# Patient Record
Sex: Male | Born: 1950 | Race: White | Hispanic: No | Smoking: Former smoker
Health system: Southern US, Community
[De-identification: ages and names within clinical notes are randomized; demographics above are authoritative.]

## PROBLEM LIST (undated history)

## (undated) DIAGNOSIS — S069XAA Unspecified intracranial injury with loss of consciousness status unknown, initial encounter: Secondary | ICD-10-CM

## (undated) DIAGNOSIS — I10 Essential (primary) hypertension: Secondary | ICD-10-CM

## (undated) DIAGNOSIS — S069X9A Unspecified intracranial injury with loss of consciousness of unspecified duration, initial encounter: Secondary | ICD-10-CM

## (undated) DIAGNOSIS — F1010 Alcohol abuse, uncomplicated: Secondary | ICD-10-CM

## (undated) DIAGNOSIS — L89309 Pressure ulcer of unspecified buttock, unspecified stage: Secondary | ICD-10-CM

## (undated) DIAGNOSIS — E871 Hypo-osmolality and hyponatremia: Secondary | ICD-10-CM

## (undated) DIAGNOSIS — R29898 Other symptoms and signs involving the musculoskeletal system: Secondary | ICD-10-CM

## (undated) DIAGNOSIS — I4891 Unspecified atrial fibrillation: Secondary | ICD-10-CM

## (undated) HISTORY — PX: NECK SURGERY: SHX720

## (undated) HISTORY — PX: APPENDECTOMY: SHX54

---

## 2005-02-04 ENCOUNTER — Ambulatory Visit: Payer: Self-pay | Admitting: Family Medicine

## 2005-04-11 ENCOUNTER — Ambulatory Visit: Payer: Self-pay

## 2013-10-01 ENCOUNTER — Emergency Department: Payer: Self-pay | Admitting: Internal Medicine

## 2014-05-22 DIAGNOSIS — G8111 Spastic hemiplegia affecting right dominant side: Secondary | ICD-10-CM | POA: Insufficient documentation

## 2014-05-22 DIAGNOSIS — I10 Essential (primary) hypertension: Secondary | ICD-10-CM | POA: Insufficient documentation

## 2014-05-22 DIAGNOSIS — Z87820 Personal history of traumatic brain injury: Secondary | ICD-10-CM | POA: Insufficient documentation

## 2015-07-09 ENCOUNTER — Inpatient Hospital Stay
Admission: EM | Admit: 2015-07-09 | Discharge: 2015-07-11 | DRG: 644 | Disposition: A | Discharge status: Home / Self Care | Payer: 59 | Attending: Internal Medicine | Admitting: Internal Medicine

## 2015-07-09 ENCOUNTER — Encounter: Payer: Self-pay | Admitting: Emergency Medicine

## 2015-07-09 DIAGNOSIS — I10 Essential (primary) hypertension: Secondary | ICD-10-CM | POA: Diagnosis present

## 2015-07-09 DIAGNOSIS — G8191 Hemiplegia, unspecified affecting right dominant side: Secondary | ICD-10-CM | POA: Diagnosis present

## 2015-07-09 DIAGNOSIS — E860 Dehydration: Secondary | ICD-10-CM | POA: Diagnosis present

## 2015-07-09 DIAGNOSIS — R112 Nausea with vomiting, unspecified: Secondary | ICD-10-CM

## 2015-07-09 DIAGNOSIS — S0990XS Unspecified injury of head, sequela: Secondary | ICD-10-CM | POA: Diagnosis not present

## 2015-07-09 DIAGNOSIS — F1010 Alcohol abuse, uncomplicated: Secondary | ICD-10-CM | POA: Diagnosis present

## 2015-07-09 DIAGNOSIS — Z79899 Other long term (current) drug therapy: Secondary | ICD-10-CM

## 2015-07-09 DIAGNOSIS — R197 Diarrhea, unspecified: Secondary | ICD-10-CM | POA: Diagnosis present

## 2015-07-09 DIAGNOSIS — Z7982 Long term (current) use of aspirin: Secondary | ICD-10-CM

## 2015-07-09 DIAGNOSIS — Z791 Long term (current) use of non-steroidal anti-inflammatories (NSAID): Secondary | ICD-10-CM

## 2015-07-09 DIAGNOSIS — E876 Hypokalemia: Secondary | ICD-10-CM | POA: Diagnosis present

## 2015-07-09 DIAGNOSIS — Z8249 Family history of ischemic heart disease and other diseases of the circulatory system: Secondary | ICD-10-CM | POA: Diagnosis not present

## 2015-07-09 DIAGNOSIS — E870 Hyperosmolality and hypernatremia: Secondary | ICD-10-CM | POA: Diagnosis present

## 2015-07-09 DIAGNOSIS — E871 Hypo-osmolality and hyponatremia: Secondary | ICD-10-CM | POA: Diagnosis present

## 2015-07-09 DIAGNOSIS — E222 Syndrome of inappropriate secretion of antidiuretic hormone: Principal | ICD-10-CM | POA: Diagnosis present

## 2015-07-09 DIAGNOSIS — F419 Anxiety disorder, unspecified: Secondary | ICD-10-CM | POA: Diagnosis present

## 2015-07-09 HISTORY — DX: Essential (primary) hypertension: I10

## 2015-07-09 LAB — URINALYSIS COMPLETE WITH MICROSCOPIC (ARMC ONLY)
BACTERIA UA: NONE SEEN
Bilirubin Urine: NEGATIVE
GLUCOSE, UA: NEGATIVE mg/dL
HGB URINE DIPSTICK: NEGATIVE
LEUKOCYTES UA: NEGATIVE
NITRITE: NEGATIVE
PROTEIN: NEGATIVE mg/dL
SPECIFIC GRAVITY, URINE: 1.014 (ref 1.005–1.030)
Squamous Epithelial / LPF: NONE SEEN
pH: 7 (ref 5.0–8.0)

## 2015-07-09 LAB — MRSA PCR SCREENING: MRSA by PCR: NEGATIVE

## 2015-07-09 LAB — COMPREHENSIVE METABOLIC PANEL
ALT: 42 U/L (ref 17–63)
ANION GAP: 11 (ref 5–15)
AST: 83 U/L — ABNORMAL HIGH (ref 15–41)
Albumin: 4.7 g/dL (ref 3.5–5.0)
Alkaline Phosphatase: 98 U/L (ref 38–126)
BUN: 7 mg/dL (ref 6–20)
CHLORIDE: 80 mmol/L — AB (ref 101–111)
CO2: 24 mmol/L (ref 22–32)
Calcium: 8.8 mg/dL — ABNORMAL LOW (ref 8.9–10.3)
Creatinine, Ser: 0.43 mg/dL — ABNORMAL LOW (ref 0.61–1.24)
GFR calc Af Amer: >60 mL/min (ref >60)
GFR calc non Af Amer: >60 mL/min (ref >60)
Glucose, Bld: 105 mg/dL — ABNORMAL HIGH (ref 65–99)
Potassium: 3.8 mmol/L (ref 3.5–5.1)
SODIUM: 115 mmol/L — AB (ref 135–145)
Total Bilirubin: 2.5 mg/dL — ABNORMAL HIGH (ref 0.3–1.2)
Total Protein: 7.6 g/dL (ref 6.5–8.1)

## 2015-07-09 LAB — CREATININE, URINE, RANDOM: CREATININE, URINE: 36 mg/dL

## 2015-07-09 LAB — TSH: TSH: 1.451 u[IU]/mL (ref 0.350–4.500)

## 2015-07-09 LAB — C DIFFICILE QUICK SCREEN W PCR REFLEX
C Diff antigen: NEGATIVE
C Diff interpretation: NEGATIVE
C Diff toxin: NEGATIVE

## 2015-07-09 LAB — CBC
HEMATOCRIT: 41.8 % (ref 40.0–52.0)
HEMOGLOBIN: 14.8 g/dL (ref 13.0–18.0)
MCH: 32.6 pg (ref 26.0–34.0)
MCHC: 35.3 g/dL (ref 32.0–36.0)
MCV: 92.3 fL (ref 80.0–100.0)
Platelets: 175 10*3/uL (ref 150–440)
RBC: 4.53 MIL/uL (ref 4.40–5.90)
RDW: 12 % (ref 11.5–14.5)
WBC: 8.6 10*3/uL (ref 3.8–10.6)

## 2015-07-09 LAB — SODIUM: Sodium: 119 mmol/L — CL (ref 135–145)

## 2015-07-09 LAB — LIPASE, BLOOD: LIPASE: 28 U/L (ref 22–51)

## 2015-07-09 LAB — SODIUM, URINE, RANDOM: SODIUM UR: 75 mmol/L

## 2015-07-09 LAB — OSMOLALITY, URINE: OSMOLALITY UR: 277 mosm/kg — AB (ref 300–900)

## 2015-07-09 MED ORDER — ENOXAPARIN SODIUM 40 MG/0.4ML ~~LOC~~ SOLN
40.0000 mg | SUBCUTANEOUS | Status: DC
  Administered 2015-07-09 – 2015-07-10 (×2): 40 mg via SUBCUTANEOUS
  Filled 2015-07-09 (×3): qty 0.4

## 2015-07-09 MED ORDER — HYDRALAZINE HCL 20 MG/ML IJ SOLN: 10.0000 mg | Freq: Four times a day (QID) | INTRAMUSCULAR | Status: DC | PRN

## 2015-07-09 MED ORDER — ACETAMINOPHEN 650 MG RE SUPP: 650.0000 mg | Freq: Four times a day (QID) | RECTAL | Status: DC | PRN

## 2015-07-09 MED ORDER — HYDROXYZINE HCL 25 MG PO TABS
25.0000 mg | ORAL_TABLET | Freq: Every day | ORAL | Status: DC
  Administered 2015-07-09 – 2015-07-10 (×2): 25 mg via ORAL
  Filled 2015-07-09 (×2): qty 1

## 2015-07-09 MED ORDER — SODIUM CHLORIDE 0.9 % IJ SOLN
3.0000 mL | Freq: Two times a day (BID) | INTRAMUSCULAR | Status: DC
Start: 2015-07-09 — End: 2015-07-11
  Administered 2015-07-09 – 2015-07-10 (×3): 3 mL via INTRAVENOUS

## 2015-07-09 MED ORDER — LORAZEPAM 0.5 MG PO TABS
1.0000 mg | ORAL_TABLET | Freq: Four times a day (QID) | ORAL | Status: DC | PRN
  Filled 2015-07-09: qty 2

## 2015-07-09 MED ORDER — VITAMIN B-1 100 MG PO TABS
100.0000 mg | ORAL_TABLET | Freq: Every day | ORAL | Status: DC
  Administered 2015-07-09 – 2015-07-11 (×3): 100 mg via ORAL
  Filled 2015-07-09 (×3): qty 1

## 2015-07-09 MED ORDER — SODIUM CHLORIDE 0.9 % IV BOLUS (SEPSIS)
1000.0000 mL | Freq: Once | INTRAVENOUS | Status: AC
  Administered 2015-07-09: 1000 mL via INTRAVENOUS

## 2015-07-09 MED ORDER — THIAMINE HCL 100 MG/ML IJ SOLN: 100.0000 mg | Freq: Every day | INTRAMUSCULAR | Status: DC

## 2015-07-09 MED ORDER — ALUM & MAG HYDROXIDE-SIMETH 200-200-20 MG/5ML PO SUSP: 30.0000 mL | Freq: Four times a day (QID) | ORAL | Status: DC | PRN

## 2015-07-09 MED ORDER — ONDANSETRON HCL 4 MG/2ML IJ SOLN: 4.0000 mg | Freq: Four times a day (QID) | INTRAMUSCULAR | Status: DC | PRN

## 2015-07-09 MED ORDER — BACLOFEN 10 MG PO TABS
10.0000 mg | ORAL_TABLET | Freq: Three times a day (TID) | ORAL | Status: DC
  Administered 2015-07-09 – 2015-07-11 (×6): 10 mg via ORAL
  Filled 2015-07-09 (×6): qty 1

## 2015-07-09 MED ORDER — ATENOLOL 50 MG PO TABS
25.0000 mg | ORAL_TABLET | Freq: Two times a day (BID) | ORAL | Status: DC
  Administered 2015-07-09: 50 mg via ORAL
  Administered 2015-07-10: 25 mg via ORAL
  Administered 2015-07-10: 21:00:00 via ORAL
  Administered 2015-07-11: 25 mg via ORAL
  Filled 2015-07-09 (×4): qty 1

## 2015-07-09 MED ORDER — AMLODIPINE BESYLATE 10 MG PO TABS
10.0000 mg | ORAL_TABLET | Freq: Every day | ORAL | Status: DC
  Administered 2015-07-09 – 2015-07-11 (×3): 10 mg via ORAL
  Filled 2015-07-09 (×3): qty 1

## 2015-07-09 MED ORDER — FOLIC ACID 1 MG PO TABS
1.0000 mg | ORAL_TABLET | Freq: Every day | ORAL | Status: DC
  Administered 2015-07-09 – 2015-07-11 (×3): 1 mg via ORAL
  Filled 2015-07-09 (×3): qty 1

## 2015-07-09 MED ORDER — SENNOSIDES-DOCUSATE SODIUM 8.6-50 MG PO TABS
1.0000 | ORAL_TABLET | Freq: Every evening | ORAL | Status: DC | PRN
Start: 2015-07-09 — End: 2015-07-11

## 2015-07-09 MED ORDER — ONDANSETRON HCL 4 MG PO TABS: 4.0000 mg | ORAL_TABLET | Freq: Four times a day (QID) | ORAL | Status: DC | PRN

## 2015-07-09 MED ORDER — ACETAMINOPHEN 325 MG PO TABS: 650.0000 mg | ORAL_TABLET | Freq: Four times a day (QID) | ORAL | Status: DC | PRN

## 2015-07-09 MED ORDER — LORAZEPAM 2 MG PO TABS
0.0000 mg | ORAL_TABLET | Freq: Four times a day (QID) | ORAL | Status: DC
  Administered 2015-07-09: 0.5 mg via ORAL

## 2015-07-09 MED ORDER — ASPIRIN EC 81 MG PO TBEC
81.0000 mg | DELAYED_RELEASE_TABLET | Freq: Every day | ORAL | Status: DC
  Administered 2015-07-09 – 2015-07-11 (×3): 81 mg via ORAL
  Filled 2015-07-09 (×3): qty 1

## 2015-07-09 MED ORDER — ADULT MULTIVITAMIN W/MINERALS CH
1.0000 | ORAL_TABLET | Freq: Every day | ORAL | Status: DC
Start: 2015-07-09 — End: 2015-07-11
  Administered 2015-07-09 – 2015-07-11 (×3): 1 via ORAL
  Filled 2015-07-09 (×3): qty 1

## 2015-07-09 MED ORDER — SODIUM CHLORIDE 0.9 % IV SOLN
INTRAVENOUS | Status: DC
  Administered 2015-07-09 – 2015-07-10 (×3): via INTRAVENOUS

## 2015-07-09 MED ORDER — LORAZEPAM 2 MG PO TABS: 0.0000 mg | ORAL_TABLET | Freq: Two times a day (BID) | ORAL | Status: DC

## 2015-07-09 MED ORDER — PAROXETINE HCL 20 MG PO TABS
10.0000 mg | ORAL_TABLET | Freq: Every day | ORAL | Status: DC
Start: 2015-07-09 — End: 2015-07-10
  Administered 2015-07-09 – 2015-07-10 (×2): 10 mg via ORAL
  Filled 2015-07-09 (×2): qty 1

## 2015-07-09 MED ORDER — LISINOPRIL 20 MG PO TABS
40.0000 mg | ORAL_TABLET | Freq: Every day | ORAL | Status: DC
  Administered 2015-07-09 – 2015-07-11 (×3): 40 mg via ORAL
  Filled 2015-07-09 (×3): qty 2

## 2015-07-09 MED ORDER — LORAZEPAM 2 MG/ML IJ SOLN: 1.0000 mg | Freq: Four times a day (QID) | INTRAMUSCULAR | Status: DC | PRN
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
## 2015-07-09 NOTE — ED Provider Notes (Signed)
[facility] Emergency Department Provider Note  ____________________________________________  Time seen: Approximately 115 PM  I have reviewed the triage vital signs and the nursing notes.   HISTORY  Chief Complaint Emesis; Diarrhea; and Fatigue    HPI Douglas Peterson is a 64 y.o. male with a history about) hypertension who is presenting today with low sodium from the [facility]. He says that he has been having nausea vomiting diarrhea over the past 2 weeks. He denies any abdominal pain. Denies any recent antibiotics. Denies any recent travel. Says that he drinks up to 12 beers per day. No excessive water intake recently.Denies any blood in the vomit or stool.   Past Medical History  Diagnosis Date  . Hypertension     There are no active problems to display for this patient.   Past Surgical History  Procedure Laterality Date  . Neck surgery      No current outpatient prescriptions on file.  Allergies Review of patient's allergies indicates no known allergies.  No family history on file.  Social History Social History  Substance Use Topics  . Smoking status: Former Research scientist (life sciences)  . Smokeless tobacco: None  . Alcohol Use: Yes     Comment: occas    Review of Systems Constitutional: No fever/chills Eyes: No visual changes. ENT: No sore throat. Cardiovascular: Denies chest pain. Respiratory: Denies shortness of breath. Gastrointestinal: No abdominal pain.  No constipation. Genitourinary: Negative for dysuria. Musculoskeletal: Negative for back pain. Skin: Negative for rash. Neurological: Negative for headaches, focal weakness or numbness.  10-point ROS otherwise negative.  ____________________________________________   PHYSICAL EXAM:  VITAL SIGNS: ED Triage Vitals  Enc Vitals Group     BP 07/09/15 1231 180/104 mmHg     Pulse Rate 07/09/15 1231 71     Resp 07/09/15 1231 18     Temp 07/09/15 1231 97.8 F (36.6 C)     Temp  Source 07/09/15 1231 Oral     SpO2 07/09/15 1231 98 %     Weight 07/09/15 1231 120 lb (54.432 kg)     Height 07/09/15 1231 5' 4"  (1.626 m)     Head Cir --      Peak Flow --      Pain Score 07/09/15 1232 7     Pain Loc --      Pain Edu? --      Excl. in [place]? --     Constitutional: Alert and oriented. Disheveled but without any acute distress. Eyes: Conjunctivae are normal. PERRL. EOMI. Head: Atraumatic. Nose: No congestion/rhinnorhea. Mouth/Throat: Mucous membranes are moist.  Oropharynx non-erythematous. Neck: No stridor.   Cardiovascular: Normal rate, regular rhythm. Grossly normal heart sounds.  Good peripheral circulation. Respiratory: Normal respiratory effort.  No retractions. Lungs CTAB. Gastrointestinal: Soft and nontender. No distention. No abdominal bruits. No CVA tenderness. Musculoskeletal: No lower extremity tenderness nor edema.  No joint effusions. Neurologic:  Normal speech and language. No gross focal neurologic deficits are appreciated. No gait instability. Skin:  Skin is warm, dry and intact. No rash noted. Psychiatric: Mood and affect are normal. Speech and behavior are normal.  ____________________________________________   LABS (all labs ordered are listed, but only abnormal results are displayed)  Labs Reviewed  COMPREHENSIVE METABOLIC PANEL - Abnormal; Notable for the following:    Sodium 115 (*)    Chloride 80 (*)    Glucose, Bld 105 (*)    Creatinine, Ser 0.43 (*)    Calcium 8.8 (*)    AST  83 (*)    Total Bilirubin 2.5 (*)    All other components within normal limits  LIPASE, BLOOD  CBC  URINALYSIS COMPLETEWITH MICROSCOPIC ([facility] ONLY)   ____________________________________________  EKG  ED ECG REPORT I, ,  Youlanda Roys, the attending physician, personally viewed and interpreted this ECG.   Date: 07/09/2015  EKG Time: 1246  Rate: 68  Rhythm: normal EKG, normal sinus rhythm  Axis: Normal axis  Intervals:none  ST&T Change: No ST  elevations or depressions. No abnormal T-wave inversions.  ____________________________________________  RADIOLOGY   ____________________________________________   PROCEDURES  CRITICAL CARE Performed by: Doran Stabler   Total critical care time: 35 minutes  Critical care time was exclusive of separately billable procedures and treating other patients.  Critical care was necessary to treat or prevent imminent or life-threatening deterioration.  Critical care was time spent personally by me on the following activities: development of treatment plan with patient and/or surrogate as well as nursing, discussions with consultants, evaluation of patient's response to treatment, examination of patient, obtaining history from patient or surrogate, ordering and performing treatments and interventions, ordering and review of laboratory studies, ordering and review of radiographic studies, pulse oximetry and re-evaluation of patient's condition.  Critical care time to because of low sodium. Low-rate normal saline started.  ____________________________________________   INITIAL IMPRESSION / ASSESSMENT AND PLAN / ED COURSE  Pertinent labs & imaging results that were available during my care of the patient were reviewed by me and considered in my medical decision making (see chart for details).  ----------------------------------------- 2:22 PM on 07/09/2015 -----------------------------------------  Patient was critically low sodium. No seizures. Likely secondary to high beer intake. Signed out to Dr. Genia Harold. We'll admit to the medicine service. ____________________________________________   FINAL CLINICAL IMPRESSION(S) / ED DIAGNOSES  Acute hyponatremia.  Nausea vomiting and diarrhea. Initial visit.    Orbie Pyo, MD 07/09/15 [phone]

## 2015-07-09 NOTE — ED Notes (Signed)
Pt states he has been having n,v,d for about 2 weeks, has been unable to keep his balance, has been weak. Was sent from [facility] with Na level of 116. Pt denies pain.

## 2015-07-10 LAB — BASIC METABOLIC PANEL
ANION GAP: 8 (ref 5–15)
BUN: 8 mg/dL (ref 6–20)
CALCIUM: 8.5 mg/dL — AB (ref 8.9–10.3)
CO2: 25 mmol/L (ref 22–32)
Chloride: 95 mmol/L — ABNORMAL LOW (ref 101–111)
Creatinine, Ser: 0.5 mg/dL — ABNORMAL LOW (ref 0.61–1.24)
Glucose, Bld: 83 mg/dL (ref 65–99)
Potassium: 3.3 mmol/L — ABNORMAL LOW (ref 3.5–5.1)
Sodium: 128 mmol/L — ABNORMAL LOW (ref 135–145)

## 2015-07-10 LAB — VITAMIN B12: VITAMIN B 12: 1217 pg/mL — AB (ref 180–914)

## 2015-07-10 LAB — OSMOLALITY: Osmolality: 258 mOsm/kg — ABNORMAL LOW (ref 275–295)

## 2015-07-10 LAB — SODIUM
SODIUM: 125 mmol/L — AB (ref 135–145)
Sodium: 125 mmol/L — ABNORMAL LOW (ref 135–145)
Sodium: 122 mmol/L — ABNORMAL LOW (ref 135–145)
Sodium: 121 mmol/L — ABNORMAL LOW (ref 135–145)
Sodium: 129 mmol/L — ABNORMAL LOW (ref 135–145)

## 2015-07-10 LAB — MAGNESIUM: Magnesium: 1.9 mg/dL (ref 1.7–2.4)

## 2015-07-10 LAB — PHOSPHORUS: PHOSPHORUS: 1.6 mg/dL — AB (ref 2.5–4.6)

## 2015-07-10 MED ORDER — POTASSIUM CHLORIDE CRYS ER 20 MEQ PO TBCR
20.0000 meq | EXTENDED_RELEASE_TABLET | Freq: Every day | ORAL | Status: DC
  Administered 2015-07-10 – 2015-07-11 (×2): 20 meq via ORAL
  Filled 2015-07-10 (×2): qty 1

## 2015-07-10 MED ORDER — TOLVAPTAN 15 MG PO TABS
15.0000 mg | ORAL_TABLET | ORAL | Status: DC
  Administered 2015-07-10: 15 mg via ORAL
  Filled 2015-07-10: qty 1

## 2015-07-10 NOTE — Progress Notes (Signed)
Paged and spoke to Dr. Juleen China in regards to [name] Na level now at 121. He stated he would put medication orders in and orders for recheck. Kathi Simpers, RN

## 2015-07-10 NOTE — Progress Notes (Signed)
Paged and spoke with Dr. Juleen China in regards to pt's current Na level of 122. He confirmed to stop pt's fluids, to keep pt in the unit as stepdown, to place pt on sodium restriction of 1500 mL. 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 Inform Dr. Juleen China that pt reports frequency but not a large volume of urine. Asked Dr. Juleen China if he thought pt needed DDAVP or a hypertonic solution. Stated he thought the patient would be able to correct on his on if he was on fluid restriction and kept a close watch on him. Kathi Simpers, RN

## 2015-07-10 NOTE — Progress Notes (Signed)
Paged and spoke with Dr. Benjie Karvonen in regards to pt's recent Na level of 122. She stated to turn off fluids, keep pt at stepdown in unit, schedule next sodium draw at 1800 and call Dr. Juleen China with results of current Na level. Kathi Simpers, RN
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
## 2015-07-10 NOTE — Care Management Note (Signed)
Case Management Note  Patient Details  Name: Douglas Peterson MRN: [id number] Date of Birth: August 15, 1951  Subjective/Objective:   [name] not home bound. Will not qualify for home health services.                  Action/Plan:   Expected Discharge Date:   07/10/2015               Expected Discharge Plan:  Home/Self Care  In-House Referral:     Discharge planning Services  CM Consult  Post Acute Care Choice:    Choice offered to:  Patient  DME Arranged:    DME Agency:     HH Arranged:    [place] Agency:     Status of Service:  Complete, Will sign off.   Medicare Important Message Given:    Date Medicare IM Given:    Medicare IM give by:    Date Additional Medicare IM Given:    Additional Medicare Important Message give by:     If discussed at [place] of Stay Meetings, dates discussed:    Additional Comments:  Jolly Mango, RN 07/10/2015, 1:12 PM

## 2015-07-10 NOTE — Progress Notes (Signed)
Douglas Peterson at [place] NAME: Douglas Peterson    MR#:  [id number]  DATE OF BIRTH:  08-Apr-1951  SUBJECTIVE:  No issues overnight.  REVIEW OF SYSTEMS:    Review of Systems  Constitutional: Negative for fever, chills and malaise/fatigue.  HENT: Negative for sore throat.   Eyes: Negative for blurred vision.  Respiratory: Negative for cough, hemoptysis, shortness of breath and wheezing.   Cardiovascular: Negative for chest pain, palpitations and leg swelling.  Gastrointestinal: Negative for nausea, vomiting, abdominal pain, diarrhea and blood in stool.  Genitourinary: Negative for dysuria.  Musculoskeletal: Negative for back pain.       Right arm muscle spasm  Neurological: Negative for dizziness, tremors and headaches.  Endo/Heme/Allergies: Does not bruise/bleed easily.    Tolerating Diet: Yes      DRUG ALLERGIES:  No Known Allergies  VITALS:  Blood pressure 174/97, pulse 73, temperature 97.8 F (36.6 C), temperature source Oral, resp. rate 14, height 5' 4"  (1.626 m), weight 51.4 kg (113 lb 5.1 oz), SpO2 100 %.  PHYSICAL EXAMINATION:   Physical Exam  Constitutional: He is oriented to person, place, and time and well-developed, well-nourished, and in no distress. No distress.  HENT:  Head: Normocephalic.  Eyes: No scleral icterus.  Neck: Normal range of motion. Neck supple. No JVD present. No tracheal deviation present.  Cardiovascular: Normal rate, regular rhythm and normal heart sounds.  Exam reveals no gallop and no friction rub.   No murmur heard. Pulmonary/Chest: Effort normal and breath sounds normal. No respiratory distress. He has no wheezes. He has no rales. He exhibits no tenderness.  Abdominal: Soft. Bowel sounds are normal. He exhibits no distension and no mass. There is no tenderness. There is no rebound and no guarding.  Musculoskeletal: Normal range of motion. He exhibits no edema.  Right arm muscle spasm  increased tone  Neurological: He is alert and oriented to person, place, and time.  Skin: Skin is warm. No rash noted. No erythema.  Psychiatric: Affect and judgment normal.      LABORATORY PANEL:   CBC  Recent Labs Lab 07/09/15 1236  WBC 8.6  HGB 14.8  HCT 41.8  PLT 175   ------------------------------------------------------------------------------------------------------------------  Chemistries   Recent Labs Lab 07/09/15 1236  07/10/15 0352 07/10/15 1126  NA 115*  < > 128* 125*  K 3.8  --  3.3*  --   CL 80*  --  95*  --   CO2 24  --  25  --   GLUCOSE 105*  --  83  --   BUN 7  --  8  --   CREATININE 0.43*  --  0.50*  --   CALCIUM 8.8*  --  8.5*  --   MG  --   --   --  1.9  AST 83*  --   --   --   ALT 42  --   --   --   ALKPHOS 98  --   --   --   BILITOT 2.5*  --   --   --   < > = values in this interval not displayed. ------------------------------------------------------------------------------------------------------------------  Cardiac Enzymes No results for input(s): TROPONINI in the last 168 hours. ------------------------------------------------------------------------------------------------------------------  RADIOLOGY:  No results found.   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  1. Hyponatremia this is likely secondary to  hyperosmolar hypovolemic hyponatremia from poor by mouth intake and diarrhea with vomiting. Sodium had improved however slightly lower at 125 this morning. I will repeat a sodium level and continue IV fluids. Continue to trend down we may need to discontinue the IV fluids. Labs are more consistent with hypovolemia. I will discontinue Paxil. 2. EtOH abuse: [name] on CIWA protocol  3. Essential hypertension: Patient will continue lisinopril, Norvasc and atenolol. Hydralazine when necessary   4. Diarrhea:  Patient no longer has diarrhea. Patient will need skilled nursing facility at discharge  Management plans discussed with the patient andhe is in agreement.  CODE STATUS FULL  TOTAL TIME TAKING CARE OF THIS PATIENT: 30 minutes.     POSSIBLE D/C 1-2 days, DEPENDING ON CLINICAL CONDITION.   ,  M.D on 07/10/2015 at 1:55 PM  Between 7am to 6pm - Pager - [phone] After 6pm go to [email or web address] - password [email or web address] [facility]  Office  [phone]  CC: Primary care physician; Douglas Kayser, MD
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
## 2015-07-10 NOTE — Clinical Social Work Note (Signed)
Clinical Social Work Assessment  Patient Details  Name: Douglas Peterson MRN: [id number] Date of Birth: 06-Aug-1951  Date of referral:  07/10/15               Reason for consult:  Facility Placement, Substance Use/ETOH Abuse                Permission sought to share information with:  Facility Art therapist granted to share information::  Yes, Verbal Permission Granted  Name::        Agency::     Relationship::     Contact Information:     Housing/Transportation Living arrangements for the past 2 months:   (home) Source of Information:  Patient Patient Interpreter Needed:  None Criminal Activity/Legal Involvement Pertinent to Current Situation/Hospitalization:  No - Comment as needed Significant Relationships:  Adult Children Lives with:  Self Do you feel safe going back to the place where you live?  Yes Need for family participation in patient care:  No (Coment)  Care giving concerns:  Patient lives at home alone.   Social Worker assessment / plan:  CSW was consulted for ETOH abuse as well as today for PT recommendation of STR. CSW spoke with RN CM who had assessed patient earlier. CSW then went into patient's ICU room and we discussed the recommendation for rehab initially. Patient stated that he was not interested in going to the rehab facilities because he had been in most of them with his mother several years ago. CSW let patient discuss his fears and apprehensions and provided some reassurance regarding potential changes that have been made to those facilities since his visit. Patient was willing for CSW to send his referral out to the local facilities but he is fairly insistent on returning home. Patient states he would be willing to do outpatient physical therapy. Patient states he drives and works full time and up until the past several weeks has been working on site at his place of employment. Patient states he has been working from home due to "gait issues."  Patient at this time brought up his ETOH use and tends to minimize his use or the significance of using. Patient initially stated that the beer he drinks helps his gait then retracted this later in the conversation. Patient states he typically has drunk 1 to 2 beers daily but as of recent, has been drinking more. Patient stated he could stop drinking at any time and did not have a problem with drinking.   Employment status:  Therapist, music:  Managed Care PT Recommendations:  [place] / Referral to community resources:  [place]  Patient/Family's Response to care:  Resistent to [facility]  Patient/Family's Understanding of and Emotional Response to Diagnosis, Current Treatment, and Prognosis:  Patient minimizes any drinking issue and believes that he can manage at home.   Emotional Assessment Appearance:  Appears older than stated age Attitude/Demeanor/Rapport:  Apprehensive (pleasant and cooperative) Affect (typically observed):    Orientation:  Oriented to Self, Oriented to  Time, Oriented to Situation Alcohol / Substance use:  Not Applicable Psych involvement (Current and /or in the community):  No (Comment)  Discharge Needs  Concerns to be addressed:  Care Coordination Readmission within the last 30 days:  No Current discharge risk:  Substance Abuse Barriers to Discharge:   (patient not wanting to go to rehab)   Shela Leff, [place] 07/10/2015, 2:04 PM
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
## 2015-07-11 LAB — SODIUM
Sodium: 137 mmol/L (ref 135–145)
Sodium: 135 mmol/L (ref 135–145)

## 2015-07-11 LAB — BASIC METABOLIC PANEL
ANION GAP: 6 (ref 5–15)
BUN: 10 mg/dL (ref 6–20)
CALCIUM: 9.2 mg/dL (ref 8.9–10.3)
CO2: 25 mmol/L (ref 22–32)
CREATININE: 0.53 mg/dL — AB (ref 0.61–1.24)
Chloride: 106 mmol/L (ref 101–111)
GFR calc Af Amer: >60 mL/min (ref >60)
GFR calc non Af Amer: >60 mL/min (ref >60)
GLUCOSE: 100 mg/dL — AB (ref 65–99)
Potassium: 3.6 mmol/L (ref 3.5–5.1)
Sodium: 137 mmol/L (ref 135–145)

## 2015-07-11 LAB — GLUCOSE, CAPILLARY: GLUCOSE-CAPILLARY: 102 mg/dL — AB (ref 65–99)

## 2015-07-11 NOTE — Progress Notes (Signed)
[place] at [place] was admitted to the Hospital on 07/09/2015 and Discharged  07/11/2015 and may be able to work from home starting 07/12/2015. He may return to work without any restrictions.  Call Bettey Costa MD with questions.  ,  M.D on 07/11/2015,at 3:04 PM  [place] at [facility]  [phone]

## 2015-07-11 NOTE — Clinical Social Work Note (Signed)
Patient has decided to return home rather than STR at this time. RN CM making those arrangements.  Shela Leff MSW,LCSW [phone]

## 2015-07-11 NOTE — Care Management Note (Signed)
Case Management Note  Patient Details  Name: Douglas Peterson MRN: [id number] Date of Birth: 03-26-1951  Subjective/Objective:   Discussed home bound status with patient. He states due to his increasing difficulty with ambulation, he will be working from home and not be driving unless medically necessary.Patient discharging today with home health follow up. [facility] chooses [facility]. [facility] presented patient with the insurance copay per visit for nursing, PT and HHA.  [place] cost is $112 per visit. Copay for PT services only is $22 per visit.  Patient opted for PT services only and will pay the copay. Explained this again to daughter.                   Action/Plan: Home with [facility] providing PT services.   Expected Discharge Date:    07/11/2015              Expected Discharge Plan:  [place]  In-House Referral:     Discharge planning Services  CM Consult  Post Acute Care Choice:    Choice offered to:  Patient  DME Arranged:  [place] rolling DME Agency:  [place]:  PT [facility] Agency:  [place]  Status of Service:  In process, will continue to follow  Medicare Important Message Given:    Date Medicare IM Given:    Medicare IM give by:    Date Additional Medicare IM Given:    Additional Medicare Important Message give by:     If discussed at [place] of Stay Meetings, dates discussed:    Additional Comments:  Jolly Mango, RN 07/11/2015, 11:48 AM
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
## 2015-07-11 NOTE — Care Management Note (Signed)
Case Management Note  Patient Details  Name: Eastyn Dattilo MRN: [id number] Date of Birth: 1951/04/10  Subjective/Objective:  Corene Cornea with [facility] in to speak with patient and his daughter regarding services. He verified that patient and daughter are both in agreement for patient to discharge home with PT services. Daughter states she plans to hire PCS services out of pocket. Offered a list of agencies and she refused.                    Action/Plan:   Expected Discharge Date:                  Expected Discharge Plan:  [place]  In-House Referral:     Discharge planning Services  CM Consult  Post Acute Care Choice:    Choice offered to:  Patient  DME Arranged:  [place] rolling DME Agency:  [place]:  PT [facility] Agency:  [place]  Status of Service:  In process, will continue to follow  Medicare Important Message Given:    Date Medicare IM Given:    Medicare IM give by:    Date Additional Medicare IM Given:    Additional Medicare Important Message give by:     If discussed at [place] of Stay Meetings, dates discussed:    Additional Comments:  Jolly Mango, RN 07/11/2015, 1:56 PM

## 2015-07-11 NOTE — Progress Notes (Signed)
Pt assisted to wheelchair via walker. All belongings of cell phone and cell phone charger returned to patient. No belongings left in room. Discharge instructions reviewed with pt and Daughter Mrs. Mallon. Work release note printed and given to patient. Wheeled out by axillary. Kathi Simpers, RN

## 2015-07-11 NOTE — Progress Notes (Signed)
[facility]  ROUNDING NOTE   Subjective:   Sister at bedside. Patient eating breakfast.  Sodium improved to 137. Status post tolvaptan.   Objective:  Vital signs in last 24 hours:  Temp:  [97.6 F (36.4 C)-97.7 F (36.5 C)] 97.7 F (36.5 C) (09/06 1600) Pulse Rate:  [59-88] 60 (09/07 0600) Resp:  [14-26] 15 (09/07 0600) BP: (97-181)/(66-110) 144/76 mmHg (09/07 0600) SpO2:  [95 %-100 %] 99 % (09/07 0600)  Weight change:  Filed Weights   07/09/15 1231 07/09/15 1600  Weight: 54.432 kg (120 lb) 51.4 kg (113 lb 5.1 oz)    Intake/Output: I/O last 3 completed shifts: In: 6 [I.V.:6] Out: 3725 [Urine:3725]   Intake/Output this shift:     Physical Exam: General: NAD  Head: Normocephalic, atraumatic. Moist oral mucosal membranes  Eyes: Anicteric, PERRL  Neck: Supple, trachea midline  Lungs:  Clear to auscultation  Heart: Regular rate and rhythm  Abdomen:  Soft, nontender,   Extremities: no peripheral edema.  Neurologic: Right sided contracture  Skin: No lesions       Basic Metabolic Panel:  Recent Labs Lab 07/09/15 1236  07/10/15 0352 07/10/15 1126 07/10/15 1348 07/10/15 1815 07/10/15 2204 07/11/15 0108 07/11/15 0738  NA 115*  < > 128* 125* 122* 121* 129* 137 137  K 3.8  --  3.3*  --   --   --   --  3.6  --   CL 80*  --  95*  --   --   --   --  106  --   CO2 24  --  25  --   --   --   --  25  --   GLUCOSE 105*  --  83  --   --   --   --  100*  --   BUN 7  --  8  --   --   --   --  10  --   CREATININE 0.43*  --  0.50*  --   --   --   --  0.53*  --   CALCIUM 8.8*  --  8.5*  --   --   --   --  9.2  --   MG  --   --   --  1.9  --   --   --   --   --   PHOS  --   --   --  1.6*  --   --   --   --   --   < > = values in this interval not displayed.  Liver Function Tests:  Recent Labs Lab 07/09/15 1236  AST 83*  ALT 42  ALKPHOS 98  BILITOT 2.5*  PROT 7.6  ALBUMIN 4.7    Recent Labs Lab 07/09/15 1236  LIPASE 28   No results for  input(s): AMMONIA in the last 168 hours.  CBC:  Recent Labs Lab 07/09/15 1236  WBC 8.6  HGB 14.8  HCT 41.8  MCV 92.3  PLT 175    Cardiac Enzymes: No results for input(s): CKTOTAL, CKMB, CKMBINDEX, TROPONINI in the last 168 hours.  BNP: Invalid input(s): POCBNP  CBG: No results for input(s): GLUCAP in the last 168 hours.  Microbiology: Results for orders placed or performed during the hospital encounter of 07/09/15  MRSA PCR Screening     Status: None   Collection Time: 07/09/15  4:11 PM  Result Value Ref Range Status   MRSA by  PCR NEGATIVE NEGATIVE Final    Comment:        The GeneXpert MRSA Assay (FDA approved for NASAL specimens only), is one component of a comprehensive MRSA colonization surveillance program. It is not intended to diagnose MRSA infection nor to guide or monitor treatment for MRSA infections.   Stool culture     Status: None (Preliminary result)   Collection Time: 07/09/15  7:40 PM  Result Value Ref Range Status   Specimen Description STOOL  Final   Special Requests NONE  Final   Culture   Final    TOO YOUNG TO READ No Pathogenic E. coli detected NO CAMPYLOBACTER DETECTED    Report Status PENDING  Incomplete  C difficile quick scan w PCR reflex     Status: None   Collection Time: 07/09/15  7:40 PM  Result Value Ref Range Status   C Diff antigen NEGATIVE NEGATIVE Final   C Diff toxin NEGATIVE NEGATIVE Final   C Diff interpretation Negative for C. difficile  Final    Coagulation Studies: No results for input(s): LABPROT, INR in the last 72 hours.  Urinalysis:  Recent Labs  07/09/15 1418  COLORURINE YELLOW*  LABSPEC 1.014  PHURINE 7.0  GLUCOSEU NEGATIVE  HGBUR NEGATIVE  BILIRUBINUR NEGATIVE  KETONESUR 1+*  PROTEINUR NEGATIVE  NITRITE NEGATIVE  LEUKOCYTESUR NEGATIVE      Imaging: No results found.   Medications:     . amLODipine  10 mg Oral Daily  . aspirin EC  81 mg Oral Daily  . atenolol  25 mg Oral BID  .  baclofen  10 mg Oral TID  . enoxaparin (LOVENOX) injection  40 mg Subcutaneous Q24H  . folic acid  1 mg Oral Daily  . hydrOXYzine  25 mg Oral QHS  . lisinopril  40 mg Oral Daily  . multivitamin with minerals  1 tablet Oral Daily  . potassium chloride  20 mEq Oral Daily  . sodium chloride  3 mL Intravenous Q12H  . thiamine  100 mg Oral Daily   Or  . thiamine  100 mg Intravenous Daily   acetaminophen **OR** acetaminophen, alum & mag hydroxide-simeth, hydrALAZINE, hydrALAZINE, LORazepam **OR** LORazepam, ondansetron **OR** ondansetron (ZOFRAN) IV, senna-docusate  Assessment/ Plan:  [name] is a 64 y.o. 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   1 Hyponatremia: Hypoosmolar hypovolemic hyponatremia from poor PO intake, N/V/D. Improved with IV normal saline however then started to have dropping sodium on normal saline. 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  Status post tolvaptan. - Continue PO intake. Some question of EtOH abuse. Labs not consistent with Beer Potomania. - Continue to monitor volume status and sodium levels.  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   2. Hypokalemia: due to Normal saline and GI losses. Improved 3.6 - oral potassium supplement.   3. Hypertension: well controlled. Home regimen of atenolol, amlodipine and lisinopril - not on a thiazide diuretic   LOS: 2 , [place] 9/7/20168:43 AM

## 2015-07-13 LAB — STOOL CULTURE

## 2015-07-16 DIAGNOSIS — F419 Anxiety disorder, unspecified: Secondary | ICD-10-CM | POA: Insufficient documentation

## 2015-09-12 ENCOUNTER — Inpatient Hospital Stay
Admission: EM | Admit: 2015-09-12 | Discharge: 2015-09-15 | DRG: 644 | Disposition: A | Discharge status: Home Health | Payer: 59 | Attending: Specialist | Admitting: Specialist

## 2015-09-12 DIAGNOSIS — Z7289 Other problems related to lifestyle: Secondary | ICD-10-CM

## 2015-09-12 DIAGNOSIS — E222 Syndrome of inappropriate secretion of antidiuretic hormone: Principal | ICD-10-CM | POA: Diagnosis present

## 2015-09-12 DIAGNOSIS — K760 Fatty (change of) liver, not elsewhere classified: Secondary | ICD-10-CM | POA: Diagnosis present

## 2015-09-12 DIAGNOSIS — F1010 Alcohol abuse, uncomplicated: Secondary | ICD-10-CM | POA: Diagnosis present

## 2015-09-12 DIAGNOSIS — K521 Toxic gastroenteritis and colitis: Secondary | ICD-10-CM | POA: Diagnosis present

## 2015-09-12 DIAGNOSIS — F419 Anxiety disorder, unspecified: Secondary | ICD-10-CM | POA: Diagnosis present

## 2015-09-12 DIAGNOSIS — E871 Hypo-osmolality and hyponatremia: Secondary | ICD-10-CM | POA: Diagnosis present

## 2015-09-12 DIAGNOSIS — Z7982 Long term (current) use of aspirin: Secondary | ICD-10-CM

## 2015-09-12 DIAGNOSIS — I10 Essential (primary) hypertension: Secondary | ICD-10-CM | POA: Diagnosis present

## 2015-09-12 DIAGNOSIS — F1090 Alcohol use, unspecified, uncomplicated: Secondary | ICD-10-CM | POA: Insufficient documentation

## 2015-09-12 DIAGNOSIS — R7989 Other specified abnormal findings of blood chemistry: Secondary | ICD-10-CM | POA: Diagnosis present

## 2015-09-12 DIAGNOSIS — Z23 Encounter for immunization: Secondary | ICD-10-CM | POA: Diagnosis not present

## 2015-09-12 DIAGNOSIS — Z87891 Personal history of nicotine dependence: Secondary | ICD-10-CM | POA: Diagnosis not present

## 2015-09-12 DIAGNOSIS — Z79899 Other long term (current) drug therapy: Secondary | ICD-10-CM | POA: Diagnosis not present

## 2015-09-12 DIAGNOSIS — Z789 Other specified health status: Secondary | ICD-10-CM

## 2015-09-12 DIAGNOSIS — D6959 Other secondary thrombocytopenia: Secondary | ICD-10-CM | POA: Diagnosis present

## 2015-09-12 DIAGNOSIS — R945 Abnormal results of liver function studies: Secondary | ICD-10-CM

## 2015-09-12 DIAGNOSIS — E860 Dehydration: Secondary | ICD-10-CM

## 2015-09-12 DIAGNOSIS — K7010 Alcoholic hepatitis without ascites: Secondary | ICD-10-CM | POA: Diagnosis not present

## 2015-09-12 DIAGNOSIS — D61818 Other pancytopenia: Secondary | ICD-10-CM | POA: Diagnosis present

## 2015-09-12 DIAGNOSIS — R197 Diarrhea, unspecified: Secondary | ICD-10-CM | POA: Insufficient documentation

## 2015-09-12 DIAGNOSIS — L309 Dermatitis, unspecified: Secondary | ICD-10-CM | POA: Diagnosis present

## 2015-09-12 HISTORY — DX: Hypo-osmolality and hyponatremia: E87.1

## 2015-09-12 HISTORY — DX: Alcohol abuse, uncomplicated: F10.10

## 2015-09-12 LAB — URINALYSIS COMPLETE WITH MICROSCOPIC (ARMC ONLY)
Bacteria, UA: NONE SEEN
Bilirubin Urine: NEGATIVE
GLUCOSE, UA: NEGATIVE mg/dL
Hgb urine dipstick: NEGATIVE
LEUKOCYTES UA: NEGATIVE
NITRITE: NEGATIVE
Protein, ur: NEGATIVE mg/dL
RBC / HPF: NONE SEEN RBC/hpf (ref 0–5)
SPECIFIC GRAVITY, URINE: 1.009 (ref 1.005–1.030)
Squamous Epithelial / LPF: NONE SEEN
pH: 6 (ref 5.0–8.0)

## 2015-09-12 LAB — CBC
HCT: 39.9 % — ABNORMAL LOW (ref 40.0–52.0)
HEMOGLOBIN: 14 g/dL (ref 13.0–18.0)
MCH: 32.3 pg (ref 26.0–34.0)
MCHC: 35.1 g/dL (ref 32.0–36.0)
MCV: 92.1 fL (ref 80.0–100.0)
Platelets: 106 10*3/uL — ABNORMAL LOW (ref 150–440)
RBC: 4.33 MIL/uL — ABNORMAL LOW (ref 4.40–5.90)
RDW: 12.3 % (ref 11.5–14.5)
WBC: 3.7 10*3/uL — AB (ref 3.8–10.6)

## 2015-09-12 LAB — COMPREHENSIVE METABOLIC PANEL
ALBUMIN: 4.3 g/dL (ref 3.5–5.0)
ALK PHOS: 82 U/L (ref 38–126)
ALT: 79 U/L — ABNORMAL HIGH (ref 17–63)
ANION GAP: 11 (ref 5–15)
AST: 125 U/L — ABNORMAL HIGH (ref 15–41)
BILIRUBIN TOTAL: 2.2 mg/dL — AB (ref 0.3–1.2)
BUN: <5 mg/dL — ABNORMAL LOW (ref 6–20)
CALCIUM: 9 mg/dL (ref 8.9–10.3)
CO2: 24 mmol/L (ref 22–32)
Chloride: 89 mmol/L — ABNORMAL LOW (ref 101–111)
Creatinine, Ser: 0.47 mg/dL — ABNORMAL LOW (ref 0.61–1.24)
GLUCOSE: 98 mg/dL (ref 65–99)
Potassium: 3.8 mmol/L (ref 3.5–5.1)
Sodium: 124 mmol/L — ABNORMAL LOW (ref 135–145)
TOTAL PROTEIN: 7.1 g/dL (ref 6.5–8.1)

## 2015-09-12 LAB — SODIUM, URINE, RANDOM: SODIUM UR: 30 mmol/L

## 2015-09-12 LAB — OSMOLALITY: OSMOLALITY: 253 mosm/kg — AB (ref 275–295)

## 2015-09-12 MED ORDER — AMLODIPINE BESYLATE 5 MG PO TABS
2.5000 mg | ORAL_TABLET | Freq: Every day | ORAL | Status: DC
  Administered 2015-09-12 – 2015-09-15 (×4): 2.5 mg via ORAL
  Filled 2015-09-12: qty 1
  Filled 2015-09-12: qty 2
  Filled 2015-09-12 (×2): qty 1

## 2015-09-12 MED ORDER — ENOXAPARIN SODIUM 40 MG/0.4ML ~~LOC~~ SOLN
40.0000 mg | SUBCUTANEOUS | Status: DC
  Administered 2015-09-12 – 2015-09-14 (×3): 40 mg via SUBCUTANEOUS
  Filled 2015-09-12 (×3): qty 0.4

## 2015-09-12 MED ORDER — CLOTRIMAZOLE-BETAMETHASONE 1-0.05 % EX CREA
1.0000 "application " | TOPICAL_CREAM | Freq: Two times a day (BID) | CUTANEOUS | Status: DC
  Administered 2015-09-12 – 2015-09-14 (×5): 1 via TOPICAL
  Filled 2015-09-12: qty 15

## 2015-09-12 MED ORDER — ASPIRIN EC 81 MG PO TBEC: 81.0000 mg | DELAYED_RELEASE_TABLET | Freq: Every day | ORAL | Status: DC

## 2015-09-12 MED ORDER — LISINOPRIL 20 MG PO TABS
10.0000 mg | ORAL_TABLET | ORAL | Status: AC
  Administered 2015-09-12: 10 mg via ORAL
  Filled 2015-09-12: qty 1

## 2015-09-12 MED ORDER — ACETAMINOPHEN 325 MG PO TABS
650.0000 mg | ORAL_TABLET | Freq: Four times a day (QID) | ORAL | Status: DC | PRN
Start: 2015-09-12 — End: 2015-09-15

## 2015-09-12 MED ORDER — ONDANSETRON HCL 4 MG PO TABS: 4.0000 mg | ORAL_TABLET | Freq: Four times a day (QID) | ORAL | Status: DC | PRN

## 2015-09-12 MED ORDER — SODIUM CHLORIDE 0.9 % IV SOLN
INTRAVENOUS | Status: DC
  Administered 2015-09-12 – 2015-09-13 (×2): via INTRAVENOUS

## 2015-09-12 MED ORDER — ASPIRIN EC 81 MG PO TBEC
81.0000 mg | DELAYED_RELEASE_TABLET | Freq: Every day | ORAL | Status: DC
  Administered 2015-09-13 – 2015-09-15 (×3): 81 mg via ORAL
  Filled 2015-09-12 (×4): qty 1

## 2015-09-12 MED ORDER — LISINOPRIL 20 MG PO TABS
20.0000 mg | ORAL_TABLET | Freq: Two times a day (BID) | ORAL | Status: DC
  Administered 2015-09-13 – 2015-09-15 (×5): 20 mg via ORAL
  Filled 2015-09-12 (×5): qty 1

## 2015-09-12 MED ORDER — ATENOLOL 25 MG PO TABS
25.0000 mg | ORAL_TABLET | Freq: Two times a day (BID) | ORAL | Status: DC
  Administered 2015-09-12 – 2015-09-15 (×6): 25 mg via ORAL
  Filled 2015-09-12 (×7): qty 1

## 2015-09-12 MED ORDER — ONDANSETRON HCL 4 MG/2ML IJ SOLN: 4.0000 mg | Freq: Four times a day (QID) | INTRAMUSCULAR | Status: DC | PRN

## 2015-09-12 MED ORDER — ADULT MULTIVITAMIN W/MINERALS CH
1.0000 | ORAL_TABLET | Freq: Every day | ORAL | Status: DC
  Administered 2015-09-13 – 2015-09-15 (×3): 1 via ORAL
  Filled 2015-09-12 (×4): qty 1

## 2015-09-12 MED ORDER — INFLUENZA VAC SPLIT QUAD 0.5 ML IM SUSY
0.5000 mL | PREFILLED_SYRINGE | INTRAMUSCULAR | Status: AC
  Administered 2015-09-13: 0.5 mL via INTRAMUSCULAR
  Filled 2015-09-12: qty 0.5

## 2015-09-12 MED ORDER — ACETAMINOPHEN 650 MG RE SUPP: 650.0000 mg | Freq: Four times a day (QID) | RECTAL | Status: DC | PRN

## 2015-09-12 MED ORDER — PAROXETINE HCL 10 MG PO TABS
10.0000 mg | ORAL_TABLET | Freq: Every day | ORAL | Status: DC
  Administered 2015-09-12 – 2015-09-15 (×4): 10 mg via ORAL
  Filled 2015-09-12 (×4): qty 1

## 2015-09-12 MED ORDER — BACLOFEN 10 MG PO TABS
10.0000 mg | ORAL_TABLET | Freq: Three times a day (TID) | ORAL | Status: DC
  Administered 2015-09-12 – 2015-09-15 (×8): 10 mg via ORAL
  Filled 2015-09-12 (×9): qty 1

## 2015-09-12 MED ORDER — SODIUM CHLORIDE 0.9 % IV BOLUS (SEPSIS)
500.0000 mL | Freq: Once | INTRAVENOUS | Status: AC
  Administered 2015-09-12: 500 mL via INTRAVENOUS

## 2015-09-12 NOTE — ED Notes (Signed)
Patient from home with diarrhea for for three day and weakness.  Patient reports having 5 liquids stools today.

## 2015-09-12 NOTE — ED Notes (Signed)
Brought in by family for possible dehyrdation  Diarrhea and weakness

## 2015-09-12 NOTE — ED Provider Notes (Signed)
[facility] Emergency Department Provider Note REMINDER - THIS NOTE IS NOT A FINAL MEDICAL RECORD UNTIL IT IS SIGNED. UNTIL THEN, THE CONTENT BELOW MAY REFLECT INFORMATION FROM A DOCUMENTATION TEMPLATE, NOT THE ACTUAL PATIENT VISIT. ____________________________________________  Time seen: Approximately 7:10 PM  I have reviewed the triage vital signs and the nursing notes.   HISTORY  Chief Complaint Abdominal Pain and Diarrhea    HPI Douglas Peterson is a 64 y.o. male previous history of hypertension, traumatic injury related with right-sided weakness, as well as an episode of hyponatremia and suspected alcohol abuse.  Patient reports today that he's been having about one week of crampy abdominal pain with watery loose stools. He reports he doesn't have any constant pain, but he gets cramps and will have to have diarrhea. Is not black or bloody. He denies any travel, recent antibiotics, or known infection.  Sister was sick with some sort of viral type illness over the last week as well.  He denies that he gets withdrawals, but he does drink in the evenings he says sometimes heavier than 1-2 beers tonight but is not real forthcoming with the amount.   Past Medical History  Diagnosis Date  . Hypertension     Patient Active Problem List   Diagnosis Date Noted  . Hyponatremia 07/09/2015    Past Surgical History  Procedure Laterality Date  . Neck surgery      Current Outpatient Rx  Name  Route  Sig  Dispense  Refill  . amLODipine (NORVASC) 2.5 MG tablet   Oral   Take 2.5 mg by mouth daily.         [name] aspirin EC 81 MG tablet   Oral   Take 81 mg by mouth daily.         [name] atenolol (TENORMIN) 25 MG tablet   Oral   Take 25 mg by mouth 2 (two) times daily.         . baclofen (LIORESAL) 10 MG tablet   Oral   Take 10 mg by mouth 3 (three) times daily.         . hydrOXYzine (ATARAX/VISTARIL) 25 MG tablet   Oral   Take 25 mg by mouth at  bedtime.         [name] lisinopril (PRINIVIL,ZESTRIL) 20 MG tablet   Oral   Take 20 mg by mouth 2 (two) times daily.           Allergies Review of patient's allergies indicates no known allergies.  History reviewed. No pertinent family history.  Social History Social History  Substance Use Topics  . Smoking status: Former Research scientist (life sciences)  . Smokeless tobacco: None  . Alcohol Use: Yes     Comment: occas    Review of Systems Constitutional: No fever/chills Eyes: No visual changes. ENT: No sore throat. Cardiovascular: Denies chest pain. Respiratory: Denies shortness of breath. Gastrointestinal: No vomiting and no nausea.  No diarrhea.  No constipation. Genitourinary: Negative for dysuria. Musculoskeletal: Negative for back pain. Skin: Negative for rash. Neurological: Negative for headaches, focal weakness or numbness except for chronically in his right arm.  10-point ROS otherwise negative.  ____________________________________________   PHYSICAL EXAM:  VITAL SIGNS: ED Triage Vitals  Enc Vitals Group     BP 09/12/15 1629 166/95 mmHg     Pulse Rate 09/12/15 1629 81     Resp 09/12/15 1629 16     Temp 09/12/15 1629 98.1 F (36.7 C)     Temp Source  09/12/15 1629 Oral     SpO2 09/12/15 1629 95 %     Weight 09/12/15 1629 122 lb (55.339 kg)     Height 09/12/15 1629 5' 5"  (1.651 m)     Head Cir --      Peak Flow --      Pain Score --      Pain Loc --      Pain Edu? --      Excl. in [place]? --    Constitutional: Alert and oriented. Well appearing and in no acute distress. Eyes: Conjunctivae are normal. PERRL. EOMI. Head: Atraumatic. Nose: No congestion/rhinnorhea. Mouth/Throat: Mucous membranes are moist.  Oropharynx non-erythematous. Neck: No stridor.   Cardiovascular: Normal rate, regular rhythm. Grossly normal heart sounds.  Good peripheral circulation. Respiratory: Normal respiratory effort.  No retractions. Lungs CTAB. Gastrointestinal: Soft and nontender. No  distention. No abdominal bruits.  Musculoskeletal: No lower extremity tenderness nor edema.  No joint effusions. Neurologic:  Normal speech and language. No gross focal neurologic deficits are appreciated. No gait instability. Skin:  Skin is warm, dry and intact. No rash noted. Psychiatric: Mood and affect are normal. Speech and behavior are normal.  ____________________________________________   LABS (all labs ordered are listed, but only abnormal results are displayed)  Labs Reviewed  COMPREHENSIVE METABOLIC PANEL - Abnormal; Notable for the following:    Sodium 124 (*)    Chloride 89 (*)    BUN <5 (*)    Creatinine, Ser 0.47 (*)    AST 125 (*)    ALT 79 (*)    Total Bilirubin 2.2 (*)    All other components within normal limits  CBC - Abnormal; Notable for the following:    WBC 3.7 (*)    RBC 4.33 (*)    HCT 39.9 (*)    Platelets 106 (*)    All other components within normal limits  URINALYSIS COMPLETEWITH MICROSCOPIC ([facility] ONLY)   ____________________________________________  EKG   ____________________________________________  RADIOLOGY   ____________________________________________   PROCEDURES  Procedure(s) performed: None  Critical Care performed: No  ____________________________________________   INITIAL IMPRESSION / ASSESSMENT AND PLAN / ED COURSE  Pertinent labs & imaging results that were available during my care of the patient were reviewed by me and considered in my medical decision making (see chart for details).  Patient presents for 1 week of loose stools. He does appear somewhat dehydrated with dry mucous membranes, new reports not being able to keep much fluid down and he can't keep up with the amount of loose stools. Does not similarity significant risk factors for Clostridium difficile or infectious diarrhea. In addition, he has a somewhat unclear history of alcohol use and I suspect abuse based on his LFTs. He has no signs or symptoms of  right upper quadrant abdominal pain, and based on my bedside evaluation I do not find any emergent need for imaging studies. We will hydrate him lightly with 500 mL of normal saline, because his sodium is 124 limited minimally the hospitalist service and defer additional fluid and electrode management to the hospitalist team at this time as it has been noted in the past the patient did respond also do fluid restriction, but a history of today clearly that of dehydration.   ____________________________________________   FINAL CLINICAL IMPRESSION(S) / ED DIAGNOSES  Final diagnoses:  Hyponatremia  Dehydration  Diarrhea, unspecified type  Alcohol use (HCC)      Delman Kitten, MD 09/12/15 1914

## 2015-09-12 NOTE — H&P (Signed)
[place] [facility] [place] NAME: Douglas Peterson    MR#:  [id number]  DATE OF BIRTH:  February 19, 1951  DATE OF ADMISSION:  09/12/2015  PRIMARY CARE PHYSICIAN: [name], MD   REQUESTING/REFERRING PHYSICIAN: Dr. Delman Kitten  CHIEF COMPLAINT:   Chief Complaint  Patient presents with  . Abdominal Pain  . Diarrhea    HISTORY OF PRESENT ILLNESS:  Douglas Peterson  is a 64 y.o. male with a known history of hypertension, previous history of hyponatremia, alcohol abuse who presents to the hospital due to ongoing diarrhea and weakness. He also says that he's been having some nausea vomiting and poor by mouth intake over the past week or so. Is recently hospitalized in September of this past year for similar symptoms and noted to be hyponatremic. He now presents with similar symptoms and was noted to have a sodium 124. Patient says that he has diarrhea about 4-5 times daily and it's watery in nature. It was associated shortly after he eats. He denies any abdominal pain, fever, chills associated with his diarrhea. He also admits to nausea and vomiting over the past week which has been nonbloody and occasionally bilious in nature. Upon arrival to the patient's room he was actually sitting up and eating a [place] sandwich. Given his hyponatremia hospitalist services were contacted further treatment and evaluation.  PAST MEDICAL HISTORY:   Past Medical History  Diagnosis Date  . Hypertension   . Hyponatremia   . Alcohol abuse     PAST SURGICAL HISTORY:   Past Surgical History  Procedure Laterality Date  . Neck surgery      SOCIAL HISTORY:   Social History  Substance Use Topics  . Smoking status: Former Research scientist (life sciences)  . Smokeless tobacco: Not on file  . Alcohol Use: 12.6 oz/week    21 Cans of beer per week     Comment: occas    FAMILY HISTORY:   Family History  Problem Relation Age of Onset  . Lung cancer Mother   . Heart attack Father      DRUG ALLERGIES:  No Known Allergies  REVIEW OF SYSTEMS:   Review of Systems  Constitutional: Negative for fever and weight loss.  HENT: Negative for congestion, nosebleeds and tinnitus.   Eyes: Negative for blurred vision, double vision and redness.  Respiratory: Negative for cough, hemoptysis and shortness of breath.   Cardiovascular: Negative for chest pain, orthopnea, leg swelling and PND.  Gastrointestinal: Positive for nausea, vomiting and diarrhea. Negative for abdominal pain and melena.  Genitourinary: Negative for dysuria, urgency and hematuria.  Musculoskeletal: Negative for joint pain and falls.  Neurological: Positive for weakness (generalized). Negative for dizziness, tingling, sensory change, focal weakness, seizures and headaches.  Endo/Heme/Allergies: Negative for polydipsia. Does not bruise/bleed easily.  Psychiatric/Behavioral: Negative for depression and memory loss. The patient is not nervous/anxious.     MEDICATIONS AT HOME:   Prior to Admission medications   Medication Sig Start Date End Date Taking? Authorizing Provider  amLODipine (NORVASC) 2.5 MG tablet Take 2.5 mg by mouth daily.   Yes Historical Provider, MD  aspirin EC 81 MG tablet Take 81 mg by mouth daily.   Yes Historical Provider, MD  aspirin EC 81 MG tablet Take 81 mg by mouth daily.   Yes Historical Provider, MD  atenolol (TENORMIN) 25 MG tablet Take 25 mg by mouth 2 (two) times daily.   Yes Historical Provider, MD  baclofen (LIORESAL) 10 MG tablet  Take 10 mg by mouth 3 (three) times daily.   Yes Historical Provider, MD  clotrimazole-betamethasone (LOTRISONE) cream Apply 1 application topically 2 (two) times daily.   Yes Historical Provider, MD  lisinopril (PRINIVIL,ZESTRIL) 20 MG tablet Take 20 mg by mouth 2 (two) times daily.   Yes Historical Provider, MD  Multiple Vitamins-Minerals (MULTIVITAMIN WITH MINERALS) tablet Take 1 tablet by mouth daily.   Yes Historical Provider, MD  PARoxetine  (PAXIL) 10 MG tablet Take 10 mg by mouth daily.   Yes Historical Provider, MD      VITAL SIGNS:  Blood pressure 166/95, pulse 81, temperature 98.1 F (36.7 C), temperature source Oral, resp. rate 16, height 5' 5"  (1.651 m), weight 55.339 kg (122 lb), SpO2 95 %.  PHYSICAL EXAMINATION:  Physical Exam  GENERAL:  64 y.o.-year-old patient lying in the bed with no acute distress.  EYES: Pupils equal, round, reactive to light and accommodation. No scleral icterus. Extraocular muscles intact.  HEENT: Head atraumatic, normocephalic. Oropharynx and nasopharynx clear. No oropharyngeal erythema, moist oral mucosa  NECK:  Supple, no jugular venous distention. No thyroid enlargement, no tenderness.  LUNGS: Normal breath sounds bilaterally, no wheezing, rales, rhonchi. No use of accessory muscles of respiration.  CARDIOVASCULAR: S1, S2 RRR. No murmurs, rubs, gallops, clicks.  ABDOMEN: Soft, nontender, nondistended. Bowel sounds present. No organomegaly or mass.  EXTREMITIES: No pedal edema, cyanosis, or clubbing. + 2 pedal & radial pulses b/l.   NEUROLOGIC: Cranial nerves II through XII are intact. No focal Motor or sensory deficits appreciated b/l PSYCHIATRIC: The patient is alert and oriented x 3. Good affect.  SKIN: Eczematous rash in the lower extremities bilaterally, no lesion, or ulcer.   LABORATORY PANEL:   CBC  Recent Labs Lab 09/12/15 1633  WBC 3.7*  HGB 14.0  HCT 39.9*  PLT 106*   ------------------------------------------------------------------------------------------------------------------  Chemistries   Recent Labs Lab 09/12/15 1633  NA 124*  K 3.8  CL 89*  CO2 24  GLUCOSE 98  BUN <5*  CREATININE 0.47*  CALCIUM 9.0  AST 125*  ALT 79*  ALKPHOS 82  BILITOT 2.2*   ------------------------------------------------------------------------------------------------------------------  Cardiac Enzymes No results for input(s): TROPONINI in the last 168  hours. ------------------------------------------------------------------------------------------------------------------  RADIOLOGY:  No results found.   IMPRESSION AND PLAN:   64 year old male with past medical history of alcohol abuse, hypertension, previous history of hyponatremia who presents to the hospital due to nausea vomiting and diarrhea and noted to be hyponatremic.  #1 hyponatremia-this is likely hyperosmolar in nature. He appears euvolemic. 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 -I will gently hydrate her with IV fluids. Check a urine, serum osmolality and urine sodium level. -Repeat sodium level in the morning.  #2 diarrhea-etiology unclear. Patient does not appear to have an infectious process as he is afebrile with a normal white cell count. -I will check stool for comprehensive culture, stool for WBCs. I will get a gastroenterology consult. Place him on a clear liquid diet for now.  #3 hypertension-continue lisinopril, atenolol.  #4 eczematous rash-continue Lotrisone cream.  #5 anxiety-continue Paxil.   All the records are reviewed and case discussed with ED provider. Management plans discussed with the patient, family and they are in agreement.  CODE STATUS: Full  TOTAL TIME TAKING CARE OF THIS PATIENT: 45 minutes.    Henreitta Leber M.D on 09/12/2015 at 8:04 PM  Between 7am to 6pm - Pager - [phone]  After 6pm go to [email or web address] -  password [email or web address] [facility]  [place] [facility]  Office  [phone]  CC: Primary care physician; [name], MD

## 2015-09-13 DIAGNOSIS — R197 Diarrhea, unspecified: Secondary | ICD-10-CM | POA: Insufficient documentation

## 2015-09-13 DIAGNOSIS — K7010 Alcoholic hepatitis without ascites: Secondary | ICD-10-CM

## 2015-09-13 DIAGNOSIS — Z789 Other specified health status: Secondary | ICD-10-CM

## 2015-09-13 DIAGNOSIS — Z7289 Other problems related to lifestyle: Secondary | ICD-10-CM | POA: Insufficient documentation

## 2015-09-13 LAB — CBC
HEMATOCRIT: 38 % — AB (ref 40.0–52.0)
HEMOGLOBIN: 13.5 g/dL (ref 13.0–18.0)
MCH: 33.4 pg (ref 26.0–34.0)
MCHC: 35.6 g/dL (ref 32.0–36.0)
MCV: 94 fL (ref 80.0–100.0)
Platelets: 87 10*3/uL — ABNORMAL LOW (ref 150–440)
RBC: 4.05 MIL/uL — ABNORMAL LOW (ref 4.40–5.90)
RDW: 12.1 % (ref 11.5–14.5)
WBC: 3.1 10*3/uL — ABNORMAL LOW (ref 3.8–10.6)

## 2015-09-13 LAB — OSMOLALITY, URINE: Osmolality, Ur: 290 mOsm/kg — ABNORMAL LOW (ref 300–900)

## 2015-09-13 LAB — BASIC METABOLIC PANEL
ANION GAP: 8 (ref 5–15)
BUN: 5 mg/dL — ABNORMAL LOW (ref 6–20)
CALCIUM: 8.3 mg/dL — AB (ref 8.9–10.3)
CO2: 24 mmol/L (ref 22–32)
Chloride: 98 mmol/L — ABNORMAL LOW (ref 101–111)
Creatinine, Ser: 0.48 mg/dL — ABNORMAL LOW (ref 0.61–1.24)
GFR calc Af Amer: >60 mL/min (ref >60)
GFR calc non Af Amer: >60 mL/min (ref >60)
GLUCOSE: 83 mg/dL (ref 65–99)
POTASSIUM: 3.4 mmol/L — AB (ref 3.5–5.1)
Sodium: 130 mmol/L — ABNORMAL LOW (ref 135–145)

## 2015-09-13 MED ORDER — POTASSIUM CHLORIDE CRYS ER 20 MEQ PO TBCR
20.0000 meq | EXTENDED_RELEASE_TABLET | Freq: Once | ORAL | Status: AC
  Administered 2015-09-13: 20 meq via ORAL
  Filled 2015-09-13: qty 1

## 2015-09-13 NOTE — Care Management Note (Signed)
Case Management Note  Patient Details  Name: Douglas Peterson MRN: [id number] Date of Birth: May 30, 1951  Subjective/Objective:                   Met with patient to discuss discharge planning. His PCP is Dr. Kathalene Frames with [facility] [phone]. He has agreed to follow up appointment on 09/26/15 at 11AM for Na+ level lab. He denies addiction to alcohol but states that he "drinks a few on a daily basis". He states that his son "Douglas Peterson does not think I drink too much either".  He relates his low Sodium to "diarrhea" which he denies having at this time. He denies not taking his Rx. He states he is independent with mobility but obtained a rolling walker "after his last hospital stay". He states he drives to appointments and able to obtain his Rx at [facility]. He has used [facility] in the past and agrees to them again if needed- he told them after first visit that he "did not need them".  Action/Plan:  List of home health agencies left with patient. Follow up appointment made with PCP. Confirmed with Douglas Peterson with [facility] that he was a patient of theirs and this is the HHPT note:  "Pnt's sister called this therapist to ask that PT DC services. He said that he felt that he was back to normal and did not want more help. He is driving, and should be starting back to work next week. Pt has visit with MD Monday, sister said that she would discuss calling me for DC request. Sister said that she would let MD know at such time she felt that pt needed more help". CSW notified of potential alcohol abuse and compliance issues.   Expected Discharge Date:                  Expected Discharge Plan:     In-House Referral:     Discharge planning Services  CM Consult  Post Acute Care Choice:  Home Health Choice offered to:  Patient  DME Arranged:    DME Agency:     HH Arranged:    [facility] Agency:  [facility]  Status of Service:  In process, will continue to follow  Medicare  Important Message Given:    Date Medicare IM Given:    Medicare IM give by:    Date Additional Medicare IM Given:    Additional Medicare Important Message give by:     If discussed at [place] of Stay Meetings, dates discussed:    Additional Comments:  [name], RN 09/13/2015, 10:07 AM

## 2015-09-13 NOTE — Consult Note (Signed)
[facility]  [address] [place], [place] [address] Phone: [phone] Fax : [phone]  Consultation  Referring Provider:     No ref. provider found Primary Care Physician:  [name], MD Primary Gastroenterologist:  None         Reason for Consultation:     Diarrhea  Date of Admission:  09/12/2015 Date of Consultation:  09/13/2015         HPI:   Douglas Peterson is a 64 y.o. male who was admitted with diarrhea and weakness. The patient has had a history of alcohol abuse. The patient also reports that he had a similar episode of diarrhea like this a few months ago with 2 weeks of diarrhea that readily resolved. The patient now reports that he has had 10 days of diarrhea with out any further diarrhea since being admitted. The patient did not call his primary care provider at [facility] primary care in [facility] when he was having the diarrhea. He also reports that his last colonoscopy was approximately 8 years ago and he was told that he doesn't need another colonoscopy until 10 years. He was admitted with hyponatremia and the sodium has, up. There is no report of any black stools or bloody stools. His hemoglobin has been normal but he does have liver enzymes consistent with alcoholic hepatitis.  Past Medical History  Diagnosis Date  . Hypertension   . Hyponatremia   . Alcohol abuse     Past Surgical History  Procedure Laterality Date  . Neck surgery      Prior to Admission medications   Medication Sig Start Date End Date Taking? Authorizing Provider  amLODipine (NORVASC) 2.5 MG tablet Take 2.5 mg by mouth daily.   Yes Historical Provider, MD  aspirin EC 81 MG tablet Take 81 mg by mouth daily.   Yes Historical Provider, MD  aspirin EC 81 MG tablet Take 81 mg by mouth daily.   Yes Historical Provider, MD  atenolol (TENORMIN) 25 MG tablet Take 25 mg by mouth 2 (two) times daily.   Yes Historical Provider, MD  baclofen (LIORESAL) 10 MG tablet Take 10 mg by mouth 3  (three) times daily.   Yes Historical Provider, MD  clotrimazole-betamethasone (LOTRISONE) cream Apply 1 application topically 2 (two) times daily.   Yes Historical Provider, MD  lisinopril (PRINIVIL,ZESTRIL) 20 MG tablet Take 20 mg by mouth 2 (two) times daily.   Yes Historical Provider, MD  Multiple Vitamins-Minerals (MULTIVITAMIN WITH MINERALS) tablet Take 1 tablet by mouth daily.   Yes Historical Provider, MD  PARoxetine (PAXIL) 10 MG tablet Take 10 mg by mouth daily.   Yes Historical Provider, MD    Family History  Problem Relation Age of Onset  . Lung cancer Mother   . Heart attack Father      Social History  Substance Use Topics  . Smoking status: Former Research scientist (life sciences)  . Smokeless tobacco: None  . Alcohol Use: 12.6 oz/week    21 Cans of beer per week     Comment: occas    Allergies as of 09/12/2015  . (No Known Allergies)    Review of Systems:    All systems reviewed and negative except where noted in HPI.   Physical Exam:  Vital signs in last 24 hours: Temp:  [98.1 F (36.7 C)-98.6 F (37 C)] 98.6 F (37 C) (11/10 0745) Pulse Rate:  [67-86] 72 (11/10 0745) Resp:  [16-19] 16 (11/10 0745) BP: (156-179)/(87-100) 156/90 mmHg (11/10 0745) SpO2:  [  95 %-99 %] 98 % (11/10 0745) Weight:  [122 lb (55.339 kg)] 122 lb (55.339 kg) (11/09 1629)   General:   Pleasant, cooperative in NAD Head:  Normocephalic and atraumatic. Eyes:   No icterus.   Conjunctiva pink. PERRLA. Ears:  Normal auditory acuity. Neck:  Supple; no masses or thyroidomegaly Lungs: Respirations even and unlabored. Lungs clear to auscultation bilaterally.   No wheezes, crackles, or rhonchi.  Heart:  Regular rate and rhythm;  Without murmur, clicks, rubs or gallops Abdomen:  Soft, nondistended, nontender. Normal bowel sounds. No appreciable masses or hepatomegaly.  No rebound or guarding.  Rectal:  Not performed. Msk:  Symmetrical without gross deformities.   Extremities:  Without edema, cyanosis or  clubbing. Neurologic:  Alert and oriented x3; decreased mobility of the right arm  Skin:  Intact without significant lesions or rashes. Cervical Nodes:  No significant cervical adenopathy. Psych:  Alert and cooperative. Normal affect.  LAB RESULTS:  Recent Labs  09/12/15 1633 09/13/15 0616  WBC 3.7* 3.1*  HGB 14.0 13.5  HCT 39.9* 38.0*  PLT 106* 87*   BMET  Recent Labs  09/12/15 1633 09/13/15 0616  NA 124* 130*  K 3.8 3.4*  CL 89* 98*  CO2 24 24  GLUCOSE 98 83  BUN <5* 5*  CREATININE 0.47* 0.48*  CALCIUM 9.0 8.3*   LFT  Recent Labs  09/12/15 1633  PROT 7.1  ALBUMIN 4.3  AST 125*  ALT 79*  ALKPHOS 82  BILITOT 2.2*   PT/INR No results for input(s): LABPROT, INR in the last 72 hours.  STUDIES: No results found.    Impression / Plan:   Douglas Peterson is a 64 y.o. y/o male with diarrhea for 10 days that has completely stopped since admission. The patient has had no further bowel movements whatsoever. He had an episode of this in the past and he believes this is all related to his excessive beer intake. The patient also has AST of 125 with ALT of 79 consistent with alcoholic hepatitis. The patient will follow-up with me as an outpatient if his diarrhea recurs and has been told to stop alcohol abuse.  Thank you for involving me in the care of this patient.      LOS: 1 day   Ollen Bowl, MD  09/13/2015, 2:10 PM   Note: This dictation was prepared with Dragon dictation along with smaller phrase technology. Any transcriptional errors that result from this process are unintentional.

## 2015-09-13 NOTE — Plan of Care (Signed)
Problem: Education: Goal: Knowledge of [facility] Education information/materials will improve Outcome: Progressing Patient seemed receptive and eager to get better  Problem: Safety: Goal: Ability to remain free from injury will improve Outcome: Progressing Patient states he will use call bell and wait for assistance
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
## 2015-09-13 NOTE — Progress Notes (Signed)
No issues my shift, NPO after midnight going for abdominal US due to alcohol history and elevated liver enzymes, vss

## 2015-09-14 ENCOUNTER — Inpatient Hospital Stay: Payer: 59

## 2015-09-14 LAB — BASIC METABOLIC PANEL
ANION GAP: 7 (ref 5–15)
BUN: 6 mg/dL (ref 6–20)
CHLORIDE: 99 mmol/L — AB (ref 101–111)
CO2: 25 mmol/L (ref 22–32)
CREATININE: 0.38 mg/dL — AB (ref 0.61–1.24)
Calcium: 8.4 mg/dL — ABNORMAL LOW (ref 8.9–10.3)
GFR calc non Af Amer: >60 mL/min (ref >60)
Glucose, Bld: 82 mg/dL (ref 65–99)
POTASSIUM: 3.2 mmol/L — AB (ref 3.5–5.1)
SODIUM: 131 mmol/L — AB (ref 135–145)

## 2015-09-14 MED ORDER — POTASSIUM CHLORIDE CRYS ER 20 MEQ PO TBCR
40.0000 meq | EXTENDED_RELEASE_TABLET | Freq: Once | ORAL | Status: AC
  Administered 2015-09-14: 40 meq via ORAL
  Filled 2015-09-14: qty 2

## 2015-09-14 NOTE — Progress Notes (Signed)
Patient called out about eating. Spoke with Dr. Dustin Flock and it is okay for patient to have a regular diet
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
## 2015-09-14 NOTE — Progress Notes (Signed)
Pt received Abd [place] today, reporting a decrease in pain. Pts VSS, no fall or injury, family at bedside. Will continue to monitor.

## 2015-09-14 NOTE — Evaluation (Signed)
Physical Therapy Evaluation Patient Details Name: Douglas Peterson MRN: [id number] DOB: [birth year] Today's Date: 09/14/2015   History of Present Illness  Pt here with hyponatremia, states he feels weak but closwer to normal.  Clinical Impression  Pt is able to ambulate with walker, holding rail in hall and briefly w/o AD. Pt is much weaker and slower than his baseline though he has no LOBs and is safe during 200 ft of ambulation.  Pt feels he will be able to go home with some help from his sister but is interested in [place] to try to work on his ambulation, speed, balance.   Follow Up Recommendations Home health PT    Equipment Recommendations  None recommended by PT    Recommendations for Other Services       Precautions / Restrictions Precautions Precautions: Fall Restrictions Weight Bearing Restrictions: No      Mobility  Bed Mobility Overal bed mobility: Modified Independent             General bed mobility comments: heavy use of bed rails, reports that he has a dresser to pull up on at home  Transfers Overall transfer level: Modified independent Equipment used: Rolling walker (2 wheeled)             General transfer comment: Pt is able to get to/from standing but again needs some extra time and positioning cues for success.  Ambulation/Gait Ambulation/Gait assistance: Min guard Ambulation Distance (Feet): 200 Feet Assistive device: Rolling walker (2 wheeled)       General Gait Details: Pt is able to ambulate with walker but also is able to ambualte with L UE holding rail in hallway and also is able to take a few very slow, shuffling steps w/o AD though he is not steady or confident with this (no AD needs at baseline)  Stairs            Wheelchair Mobility    Modified Rankin (Stroke Patients Only)       Balance                                             Pertinent Vitals/Pain Pain Assessment: No/denies pain    Home  Living Family/patient expects to be discharged to:: Private residence Living Arrangements: Alone Available Help at Discharge:  (reports his sister can help, also has a son`)   Home Access: Stairs to enter   [employer] of Steps: 2 Home Layout: One [place]: [place] - single point      Prior Function Level of Independence: Independent         Comments: Indep with household and community mobility without assist device; working at [employer].  Does endorse at least 2 falls in recent weeks.     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, high tone)           Lower Extremity Assessment: Overall WFL for tasks assessed (R LE with minimally less strength and coordination)         Communication   Communication: No difficulties (occasional soft, difficult to understand speech)  Cognition Arousal/Alertness: Awake/alert Behavior During Therapy: WFL for tasks assessed/performed Overall Cognitive Status: Within Functional Limits for tasks assessed  General Comments      Exercises        Assessment/Plan    PT Assessment Patient needs continued PT services  PT Diagnosis Difficulty walking;Generalized weakness   PT Problem List Decreased strength;Decreased range of motion;Decreased activity tolerance;Decreased balance;Decreased mobility;Decreased knowledge of use of DME;Decreased safety awareness  PT Treatment Interventions DME instruction;Gait training;Stair training;Functional mobility training;Therapeutic activities;Therapeutic exercise;Balance training   PT Goals (Current goals can be found in the Care Plan section) Acute Rehab PT Goals Patient Stated Goal: wants to go home PT Goal Formulation: With patient Time For Goal Achievement: 09/28/15 Potential to Achieve Goals: Good    Frequency Min 2X/week   Barriers to discharge         Co-evaluation               End of Session Equipment Utilized During Treatment: Gait belt Activity Tolerance: Patient tolerated treatment well Patient left: in chair;with nursing/sitter in room Nurse Communication: Mobility status         Time: 0630-1601 PT Time Calculation (min) (ACUTE ONLY): 14 min   Charges:   PT Evaluation $Initial PT Evaluation Tier I: 1 Procedure     PT G Codes:       Wayne Both, PT, DPT [phone]  Kreg Shropshire 09/14/2015, 5:00 PM

## 2015-09-15 LAB — BASIC METABOLIC PANEL
Anion gap: 7 (ref 5–15)
BUN: 8 mg/dL (ref 6–20)
CHLORIDE: 98 mmol/L — AB (ref 101–111)
CO2: 27 mmol/L (ref 22–32)
Calcium: 8.7 mg/dL — ABNORMAL LOW (ref 8.9–10.3)
Creatinine, Ser: 0.5 mg/dL — ABNORMAL LOW (ref 0.61–1.24)
Glucose, Bld: 93 mg/dL (ref 65–99)
POTASSIUM: 3.8 mmol/L (ref 3.5–5.1)
SODIUM: 132 mmol/L — AB (ref 135–145)

## 2015-09-15 NOTE — Discharge Instructions (Signed)
°  DIET:  Cardiac diet  DISCHARGE CONDITION:  Stable  ACTIVITY:  Activity as tolerated  OXYGEN:  Home Oxygen: No.   Oxygen Delivery: room air  DISCHARGE LOCATION:  Home with Home health PT   If you experience worsening of your admission symptoms, develop shortness of breath, life threatening emergency, suicidal or homicidal thoughts you must seek medical attention immediately by calling 911 or calling your MD immediately  if symptoms less severe.  You Must read complete instructions/literature along with all the possible adverse reactions/side effects for all the Medicines you take and that have been prescribed to you. Take any new Medicines after you have completely understood and accpet all the possible adverse reactions/side effects.   Please note  You were cared for by a hospitalist during your hospital stay. If you have any questions about your discharge medications or the care you received while you were in the hospital after you are discharged, you can call the unit and asked to speak with the hospitalist on call if the hospitalist that took care of you is not available. Once you are discharged, your primary care physician will handle any further medical issues. Please note that NO REFILLS for any discharge medications will be authorized once you are discharged, as it is imperative that you return to your primary care physician (or establish a relationship with a primary care physician if you do not have one) for your aftercare needs so that they can reassess your need for medications and monitor your lab values.

## 2015-09-15 NOTE — Discharge Summary (Signed)
Douglas Peterson at [facility] [place] NAME: Douglas Peterson    MR#:  [id number]  DATE OF BIRTH:  12-Sep-1951  DATE OF ADMISSION:  09/12/2015 ADMITTING PHYSICIAN: Henreitta Leber, MD  DATE OF DISCHARGE: 09/15/2015 11:55 AM  PRIMARY CARE PHYSICIAN: THIES, DAVID, MD    ADMISSION DIAGNOSIS:  Dehydration [E86.0] Hyponatremia [E87.1] Alcohol use ([place]) [Z78.9] Diarrhea, unspecified type [R19.7]  DISCHARGE DIAGNOSIS:  Active Problems:   Hyponatremia   Alcohol use ([place])   Diarrhea   SECONDARY DIAGNOSIS:   Past Medical History  Diagnosis Date  . Hypertension   . Hyponatremia   . Alcohol abuse     HOSPITAL COURSE:   64 year old male with past medical history of alcohol abuse, hypertension, previous history of hyponatremia who presents to the hospital due to nausea vomiting and diarrhea and noted to be hyponatremic.  #1 hyponatremia-this was likely hypoosmolar nature. Patient actually was euvolemic on admission. -This was likely secondary to volume loss from the diarrhea and complicated with mild SIADH. -Patient was hydrated with IV fluids and sodium has much improved since admission and now normalized. His weakness has clinically improved and he feels better and therefore is being discharged home.  #2 diarrhea-patient presented with diarrhea on admission but actually had no worsening of his diarrhea while in the hospital. A gastroenterology consult was obtained but as his symptoms had improved recommended outpatient follow-up. -Upon discharge patient was tolerating a regular diet.  #3 hypertension-he will continue lisinopril, atenolol.  #4 eczematous rash-he will continue Lotrisone cream.  #5 anxiety-he will continue Paxil.  #6 pancytopenia-this was likely secondary to his alcohol abuse and can further be followed as an outpatient. Patient did not require any transfusions while in the hospital.  Patient was seen by physical therapy and  recommended home health services and therefore home health PT was arranged for him prior to discharge.  DISCHARGE CONDITIONS:   Stable  CONSULTS OBTAINED:  Treatment Team:  Lucilla Lame, MD  DRUG ALLERGIES:  No Known Allergies  DISCHARGE MEDICATIONS:   Discharge Medication List as of 09/15/2015 11:42 AM    CONTINUE these medications which have NOT CHANGED   Details  amLODipine (NORVASC) 2.5 MG tablet Take 2.5 mg by mouth daily., Until Discontinued, Historical Med    aspirin EC 81 MG tablet Take 81 mg by mouth daily., Until Discontinued, Historical Med    atenolol (TENORMIN) 25 MG tablet Take 25 mg by mouth 2 (two) times daily., Until Discontinued, Historical Med    baclofen (LIORESAL) 10 MG tablet Take 10 mg by mouth 3 (three) times daily., Until Discontinued, Historical Med    clotrimazole-betamethasone (LOTRISONE) cream Apply 1 application topically 2 (two) times daily., Until Discontinued, Historical Med    lisinopril (PRINIVIL,ZESTRIL) 20 MG tablet Take 20 mg by mouth 2 (two) times daily., Until Discontinued, Historical Med    Multiple Vitamins-Minerals (MULTIVITAMIN WITH MINERALS) tablet Take 1 tablet by mouth daily., Until Discontinued, Historical Med    PARoxetine (PAXIL) 10 MG tablet Take 10 mg by mouth daily., Until Discontinued, Historical Med         DISCHARGE INSTRUCTIONS:   DIET:  Cardiac diet  DISCHARGE CONDITION:  Stable  ACTIVITY:  Activity as tolerated  OXYGEN:  Home Oxygen: No.   Oxygen Delivery: room air  DISCHARGE LOCATION:  home   If you experience worsening of your admission symptoms, develop shortness of breath, life threatening emergency, suicidal or homicidal thoughts you must seek medical attention immediately by calling  911 or calling your MD immediately  if symptoms less severe.  You Must read complete instructions/literature along with all the possible adverse reactions/side effects for all the Medicines you take and that have  been prescribed to you. Take any new Medicines after you have completely understood and accpet all the possible adverse reactions/side effects.   Please note  You were cared for by a hospitalist during your hospital stay. If you have any questions about your discharge medications or the care you received while you were in the hospital after you are discharged, you can call the unit and asked to speak with the hospitalist on call if the hospitalist that took care of you is not available. Once you are discharged, your primary care physician will handle any further medical issues. Please note that NO REFILLS for any discharge medications will be authorized once you are discharged, as it is imperative that you return to your primary care physician (or establish a relationship with a primary care physician if you do not have one) for your aftercare needs so that they can reassess your need for medications and monitor your lab values.     Today   No diarrhea today. Tolerating a regular diet well. No abdominal pain, nausea, vomiting. Sodium much improved.  VITAL SIGNS:  Blood pressure 151/89, pulse 60, temperature 98.3 F (36.8 C), temperature source Oral, resp. rate 16, height 5' 5"  (1.651 m), weight 55.339 kg (122 lb), SpO2 98 %.  I/O:   Intake/Output Summary (Last 24 hours) at 09/15/15 1610 Last data filed at 09/15/15 0900  Gross per 24 hour  Intake    480 ml  Output    750 ml  Net   -270 ml    PHYSICAL EXAMINATION:  GENERAL:  64 y.o.-year-old patient lying in the bed with no acute distress.  EYES: Pupils equal, round, reactive to light and accommodation. No scleral icterus. Extraocular muscles intact.  HEENT: Head atraumatic, normocephalic. Oropharynx and nasopharynx clear.  NECK:  Supple, no jugular venous distention. No thyroid enlargement, no tenderness.  LUNGS: Normal breath sounds bilaterally, no wheezing, rales,rhonchi. No use of accessory muscles of respiration.  CARDIOVASCULAR:  S1, S2 normal. No murmurs, rubs, or gallops.  ABDOMEN: Soft, non-tender, non-distended. Bowel sounds present. No organomegaly or mass.  EXTREMITIES: No pedal edema, cyanosis, or clubbing.  NEUROLOGIC: Cranial nerves II through XII are intact. No focal motor or sensory defecits b/l.  PSYCHIATRIC: The patient is alert and oriented x 3. Good affect.  SKIN: No obvious rash, lesion, or ulcer.   DATA REVIEW:   CBC  Recent Labs Lab 09/13/15 0616  WBC 3.1*  HGB 13.5  HCT 38.0*  PLT 87*    Chemistries   Recent Labs Lab 09/12/15 1633  09/15/15 0343  NA 124*  < > 132*  K 3.8  < > 3.8  CL 89*  < > 98*  CO2 24  < > 27  GLUCOSE 98  < > 93  BUN <5*  < > 8  CREATININE 0.47*  < > 0.50*  CALCIUM 9.0  < > 8.7*  AST 125*  --   --   ALT 79*  --   --   ALKPHOS 82  --   --   BILITOT 2.2*  --   --   < > = values in this interval not displayed.  Cardiac Enzymes No results for input(s): TROPONINI in the last 168 hours.  Microbiology Results  Results for orders placed or performed during  the hospital encounter of 09/12/15  Stool culture     Status: None (Preliminary result)   Collection Time: 09/14/15 12:21 PM  Result Value Ref Range Status   Specimen Description STOOL  Final   Special Requests Normal  Final   Culture   Final    NO SALMONELLA OR SHIGELLA ISOLATED No Pathogenic E. coli detected NO CAMPYLOBACTER DETECTED    Report Status PENDING  Incomplete    RADIOLOGY:  US Abdomen Complete  09/14/2015  CLINICAL DATA:  Elevated LFTs. EXAM: ULTRASOUND ABDOMEN COMPLETE COMPARISON:  No prior available for comparison. FINDINGS: Gallbladder: No gallstones or wall thickening visualized. No sonographic Murphy sign noted. Common bile duct: Diameter: 4.7 mm Liver: Liver demonstrates increased echogenicity consistent fatty infiltration and/or hepatocellular disease. No focal hepatic abnormality identified. IVC: No abnormality visualized. Pancreas: Visualized portion unremarkable. Spleen: Size  and appearance within normal limits. Right Kidney: Length: 10.2 cm. Echogenicity within normal limits. No mass or hydronephrosis visualized. Left Kidney: Length: 11.3 cm. Echogenicity within normal limits. No mass or hydronephrosis visualized. Abdominal aorta: No aneurysm visualized. Other findings: None. IMPRESSION: Liver is echogenic consistent fatty infiltration and/or hepatocellular disease. No focal hepatic abnormality identified. No gallstones or biliary distention. Electronically Signed   By: Marcello Moores  Register   On: 09/14/2015 10:38      Management plans discussed with the patient, family and they are in agreement.  CODE STATUS:   TOTAL TIME TAKING CARE OF THIS PATIENT: 40 minutes.    Henreitta Leber M.D on 09/15/2015 at 4:10 PM  Between 7am to 6pm - Pager - [phone]  After 6pm go to [email or web address] - password [email or web address] [facility]  Office  [phone]  CC: Primary care physician; [name], MD

## 2015-09-15 NOTE — Care Management Note (Signed)
Case Management Note  Patient Details  Name: Douglas Peterson MRN: [id number] Date of Birth: [birth year]  Subjective/Objective:    Referral faxed and called to [facility] requesting home health PT.                Action/Plan:   Expected Discharge Date:                  Expected Discharge Plan:     In-House Referral:     Discharge planning Services  CM Consult  Post Acute Care Choice:  Home Health Choice offered to:  Patient  DME Arranged:    DME Agency:     HH Arranged:    [place] Agency:  [facility]  Status of Service:  In process, will continue to follow  Medicare Important Message Given:    Date Medicare IM Given:    Medicare IM give by:    Date Additional Medicare IM Given:    Additional Medicare Important Message give by:     If discussed at [place] of Stay Meetings, dates discussed:    Additional Comments:  , A, RN 09/15/2015, 9:05 AM

## 2015-09-15 NOTE — Progress Notes (Signed)
Alert and oriented. Rested comfortably during the night. No complaint of pain.

## 2015-09-16 LAB — WBCS, STOOL: WBCs, Stool: NONE SEEN

## 2015-09-16 LAB — STOOL CULTURE: Special Requests: NORMAL

## 2016-06-17 DIAGNOSIS — R748 Abnormal levels of other serum enzymes: Secondary | ICD-10-CM | POA: Insufficient documentation

## 2016-11-07 ENCOUNTER — Emergency Department
Admission: EM | Admit: 2016-11-07 | Discharge: 2016-11-07 | Disposition: A | Discharge status: Home / Self Care | Payer: Medicare Other | Attending: Emergency Medicine | Admitting: Emergency Medicine

## 2016-11-07 ENCOUNTER — Encounter: Payer: Self-pay | Admitting: Emergency Medicine

## 2016-11-07 DIAGNOSIS — Z7982 Long term (current) use of aspirin: Secondary | ICD-10-CM | POA: Diagnosis not present

## 2016-11-07 DIAGNOSIS — R112 Nausea with vomiting, unspecified: Secondary | ICD-10-CM | POA: Insufficient documentation

## 2016-11-07 DIAGNOSIS — R531 Weakness: Secondary | ICD-10-CM | POA: Insufficient documentation

## 2016-11-07 DIAGNOSIS — Z87891 Personal history of nicotine dependence: Secondary | ICD-10-CM | POA: Diagnosis not present

## 2016-11-07 DIAGNOSIS — R197 Diarrhea, unspecified: Secondary | ICD-10-CM | POA: Diagnosis not present

## 2016-11-07 DIAGNOSIS — I10 Essential (primary) hypertension: Secondary | ICD-10-CM | POA: Diagnosis not present

## 2016-11-07 DIAGNOSIS — Z79899 Other long term (current) drug therapy: Secondary | ICD-10-CM | POA: Insufficient documentation

## 2016-11-07 LAB — COMPREHENSIVE METABOLIC PANEL
ALT: 75 U/L — ABNORMAL HIGH (ref 17–63)
ANION GAP: 12 (ref 5–15)
AST: 237 U/L — ABNORMAL HIGH (ref 15–41)
Albumin: 3.4 g/dL — ABNORMAL LOW (ref 3.5–5.0)
Alkaline Phosphatase: 168 U/L — ABNORMAL HIGH (ref 38–126)
BUN: <5 mg/dL — ABNORMAL LOW (ref 6–20)
CO2: 25 mmol/L (ref 22–32)
Calcium: 7.9 mg/dL — ABNORMAL LOW (ref 8.9–10.3)
Chloride: 97 mmol/L — ABNORMAL LOW (ref 101–111)
Creatinine, Ser: 0.43 mg/dL — ABNORMAL LOW (ref 0.61–1.24)
GFR calc non Af Amer: >60 mL/min (ref >60)
Glucose, Bld: 112 mg/dL — ABNORMAL HIGH (ref 65–99)
POTASSIUM: 4 mmol/L (ref 3.5–5.1)
SODIUM: 134 mmol/L — AB (ref 135–145)
Total Bilirubin: 2.4 mg/dL — ABNORMAL HIGH (ref 0.3–1.2)
Total Protein: 6.8 g/dL (ref 6.5–8.1)

## 2016-11-07 LAB — URINALYSIS, COMPLETE (UACMP) WITH MICROSCOPIC
Bacteria, UA: NONE SEEN
Bilirubin Urine: NEGATIVE
GLUCOSE, UA: NEGATIVE mg/dL
Hgb urine dipstick: NEGATIVE
KETONES UR: NEGATIVE mg/dL
Leukocytes, UA: NEGATIVE
Nitrite: NEGATIVE
PROTEIN: 30 mg/dL — AB
RBC / HPF: NONE SEEN RBC/hpf (ref 0–5)
SQUAMOUS EPITHELIAL / LPF: NONE SEEN
Specific Gravity, Urine: 1.016 (ref 1.005–1.030)
pH: 5 (ref 5.0–8.0)

## 2016-11-07 LAB — CK: CK TOTAL: 107 U/L (ref 49–397)

## 2016-11-07 LAB — CBC
HCT: 41.3 % (ref 40.0–52.0)
HEMOGLOBIN: 14.4 g/dL (ref 13.0–18.0)
MCH: 34.5 pg — AB (ref 26.0–34.0)
MCHC: 34.8 g/dL (ref 32.0–36.0)
MCV: 99.2 fL (ref 80.0–100.0)
Platelets: 106 10*3/uL — ABNORMAL LOW (ref 150–440)
RBC: 4.16 MIL/uL — AB (ref 4.40–5.90)
RDW: 12.8 % (ref 11.5–14.5)
WBC: 3.4 10*3/uL — ABNORMAL LOW (ref 3.8–10.6)

## 2016-11-07 LAB — LIPASE, BLOOD: LIPASE: 30 U/L (ref 11–51)

## 2016-11-07 LAB — TROPONIN I

## 2016-11-07 LAB — ETHANOL: Alcohol, Ethyl (B): 227 mg/dL — ABNORMAL HIGH (ref <5)

## 2016-11-07 MED ORDER — THIAMINE HCL 100 MG/ML IJ SOLN: 100.0000 mg | Freq: Every day | INTRAMUSCULAR | Status: DC

## 2016-11-07 MED ORDER — SODIUM CHLORIDE 0.9 % IV BOLUS (SEPSIS)
1000.0000 mL | Freq: Once | INTRAVENOUS | Status: AC
  Administered 2016-11-07: 1000 mL via INTRAVENOUS

## 2016-11-07 MED ORDER — LORAZEPAM 2 MG/ML IJ SOLN: 0.0000 mg | Freq: Two times a day (BID) | INTRAMUSCULAR | Status: DC

## 2016-11-07 MED ORDER — ONDANSETRON HCL 4 MG/2ML IJ SOLN
4.0000 mg | Freq: Once | INTRAMUSCULAR | Status: AC | PRN
  Administered 2016-11-07: 4 mg via INTRAVENOUS

## 2016-11-07 MED ORDER — ONDANSETRON HCL 4 MG/2ML IJ SOLN: 4.0000 mg | Freq: Once | INTRAMUSCULAR | Status: DC

## 2016-11-07 MED ORDER — LORAZEPAM 2 MG/ML IJ SOLN
0.0000 mg | Freq: Four times a day (QID) | INTRAMUSCULAR | Status: DC
  Administered 2016-11-07: 1 mg via INTRAVENOUS
  Filled 2016-11-07: qty 2

## 2016-11-07 MED ORDER — VITAMIN B-1 100 MG PO TABS
100.0000 mg | ORAL_TABLET | Freq: Every day | ORAL | Status: DC
  Administered 2016-11-07: 100 mg via ORAL
  Filled 2016-11-07: qty 1

## 2016-11-07 MED ORDER — ONDANSETRON HCL 4 MG/2ML IJ SOLN
INTRAMUSCULAR | Status: AC
  Filled 2016-11-07: qty 2

## 2016-11-07 MED ORDER — ONDANSETRON 4 MG PO TBDP: 4.0000 mg | ORAL_TABLET | Freq: Three times a day (TID) | ORAL | 0 refills | Status: DC | PRN

## 2016-11-07 NOTE — ED Provider Notes (Signed)
[facility] Emergency Department Provider Note  Time seen: 11:29 AM  I have reviewed the triage vital signs and the nursing notes.   HISTORY  Chief Complaint Emesis    HPI Douglas Peterson is a 66 y.o. male with a past medical history of significant alcohol abuse, hypertension, presents the emergency department with generalized weakness and fatigue along with vomiting and diarrhea. According to the patient for the past one week he has been extremely weak and fatigued with multiple episodes of vomiting throughout the day 3-4 episodes of diarrhea per day. States the weakness has gotten so bad that he came to the emergency department for evaluation. Patient admits to heavy alcohol use but does not drink and since last night. Family has spoken to me outside the room showing me a picture of 30+ wine bottles that have been drinken over the past several days.Patient denies abdominal pain. Denies chest pain.  Past Medical History:  Diagnosis Date  . Alcohol abuse   . Hypertension   . Hyponatremia     Patient Active Problem List   Diagnosis Date Noted  . Alcohol use   . Diarrhea   . Hyponatremia 07/09/2015    Past Surgical History:  Procedure Laterality Date  . NECK SURGERY      Prior to Admission medications   Medication Sig Start Date End Date Taking? Authorizing Provider  amLODipine (NORVASC) 2.5 MG tablet Take 2.5 mg by mouth daily.    Historical Provider, MD  aspirin EC 81 MG tablet Take 81 mg by mouth daily.    Historical Provider, MD  atenolol (TENORMIN) 25 MG tablet Take 25 mg by mouth 2 (two) times daily.    Historical Provider, MD  baclofen (LIORESAL) 10 MG tablet Take 10 mg by mouth 3 (three) times daily.    Historical Provider, MD  clotrimazole-betamethasone (LOTRISONE) cream Apply 1 application topically 2 (two) times daily.    Historical Provider, MD  lisinopril (PRINIVIL,ZESTRIL) 20 MG tablet Take 20 mg by mouth 2 (two) times daily.     Historical Provider, MD  Multiple Vitamins-Minerals (MULTIVITAMIN WITH MINERALS) tablet Take 1 tablet by mouth daily.    Historical Provider, MD  PARoxetine (PAXIL) 10 MG tablet Take 10 mg by mouth daily.    Historical Provider, MD    No Known Allergies  Family History  Problem Relation Age of Onset  . Lung cancer Mother   . Heart attack Father     Social History Social History  Substance Use Topics  . Smoking status: Former Research scientist (life sciences)  . Smokeless tobacco: Not on file  . Alcohol use 12.6 oz/week    21 Cans of beer per week     Comment: occas    Review of Systems Constitutional: Negative for fever. Cardiovascular: Negative for chest pain. Respiratory: Negative for shortness of breath. Gastrointestinal: Negative for abdominal pain. Positive for nausea, vomiting and diarrhea. Genitourinary: Negative for dysuria. Neurological: Negative for headaches, focal weakness or numbness. 10-point ROS otherwise negative.  ____________________________________________   PHYSICAL EXAM:  VITAL SIGNS: ED Triage Vitals  Enc Vitals Group     BP 11/07/16 1035 (!) 190/122     Pulse Rate 11/07/16 1035 (!) 103     Resp 11/07/16 1035 (!) 22     Temp 11/07/16 1036 97.7 F (36.5 C)     Temp Source 11/07/16 1035 Oral     SpO2 11/07/16 1035 98 %     Weight 11/07/16 1035 130 lb (59 kg)  Height 11/07/16 1035 5' 4"  (1.626 m)     Head Circumference --      Peak Flow --      Pain Score --      Pain Loc --      Pain Edu? --      Excl. in [place]? --     Constitutional: Alert and oriented. Well appearing and in no distress. Eyes: Normal exam ENT   Head: Normocephalic and atraumatic.   Mouth/Throat: Mucous membranes are moist. Cardiovascular: Normal rate, regular rhythm. No murmur Respiratory: Normal respiratory effort without tachypnea nor retractions. Breath sounds are clear Gastrointestinal: Soft and nontender. No distention.  Musculoskeletal: Nontender with normal range of motion in  all extremities.  Neurologic:  Normal speech and language. No gross focal neurologic deficits  Skin:  Skin is warm, dry and intact.  Psychiatric: Mood and affect are normal.   ____________________________________________    EKG  EKG reviewed and interpreted by myself normal sinus rhythm at 90 bpm. Narrow QRS, normal axis, normal intervals, no concerning ST changes.  ____________________________________________    INITIAL IMPRESSION / ASSESSMENT AND PLAN / ED COURSE  Pertinent labs & imaging results that were available during my care of the patient were reviewed by me and considered in my medical decision making (see chart for details).  The patient presents to the emergency department with nausea and vomiting and diarrhea for the past one week. Patient states significant weakness. [name] apparently very heavy alcohol drinker. We will check labs, IV hydrate, treat nausea. Patient's last drink was last night per patient, I have placed him on CIWA protocols.  Patient's labs are largely unrevealing. Patient has an elevated alcohol level of 227. I discussed detox options with the patient. He states he is not interested in detox and he can stop drinking if he wants to. Patient states he continues to feel extremely nauseous and extremely weak. We'll attempt an oral trial. We will also attempt ambulation.  Patient able to ambulate with minimal assistance. Patient has eaten in the emergency department without issue. I once again discussed with the patient detox facilities in evaluation by TTS. Patient does not want to explore these possibilities. States he rather be discharged home. We will discharge the patient this time with Zofran to be used as needed and PCP follow-up.  ____________________________________________   FINAL CLINICAL IMPRESSION(S) / ED DIAGNOSES  weakness nausea/vomiting/diarrhea    Harvest Dark, MD 11/07/16 [phone]

## 2016-11-07 NOTE — ED Notes (Signed)
Given meal tray. Has kept ginger ale down

## 2016-11-07 NOTE — ED Notes (Signed)
Pt ate sandwich. Has not vomited.

## 2016-11-07 NOTE — ED Notes (Signed)
Meal tray ordered 

## 2016-11-07 NOTE — ED Triage Notes (Signed)
Pt c/o NVD for 3 days. Denies pain. Drinks 2 bottles of wine a day; last drink midnight. Has been weaker than normal. Denies falling but was sitting on floor and unable to get up because of weakness.

## 2016-11-07 NOTE — ED Notes (Signed)
Given meal tray.

## 2016-11-08 ENCOUNTER — Encounter: Payer: Self-pay | Admitting: *Deleted

## 2016-11-08 ENCOUNTER — Emergency Department
Admission: EM | Admit: 2016-11-08 | Discharge: 2016-11-08 | Disposition: A | Discharge status: Home / Self Care | Payer: Medicare Other | Attending: Emergency Medicine | Admitting: Emergency Medicine

## 2016-11-08 DIAGNOSIS — I10 Essential (primary) hypertension: Secondary | ICD-10-CM | POA: Insufficient documentation

## 2016-11-08 DIAGNOSIS — R112 Nausea with vomiting, unspecified: Secondary | ICD-10-CM | POA: Insufficient documentation

## 2016-11-08 DIAGNOSIS — Z7982 Long term (current) use of aspirin: Secondary | ICD-10-CM | POA: Diagnosis not present

## 2016-11-08 DIAGNOSIS — Z87891 Personal history of nicotine dependence: Secondary | ICD-10-CM | POA: Insufficient documentation

## 2016-11-08 DIAGNOSIS — Z79899 Other long term (current) drug therapy: Secondary | ICD-10-CM | POA: Insufficient documentation

## 2016-11-08 DIAGNOSIS — R197 Diarrhea, unspecified: Secondary | ICD-10-CM | POA: Diagnosis not present

## 2016-11-08 HISTORY — DX: Other symptoms and signs involving the musculoskeletal system: R29.898

## 2016-11-08 LAB — COMPREHENSIVE METABOLIC PANEL
ALBUMIN: 3.5 g/dL (ref 3.5–5.0)
ALT: 72 U/L — ABNORMAL HIGH (ref 17–63)
AST: 225 U/L — AB (ref 15–41)
Alkaline Phosphatase: 171 U/L — ABNORMAL HIGH (ref 38–126)
Anion gap: 8 (ref 5–15)
CHLORIDE: 96 mmol/L — AB (ref 101–111)
CO2: 28 mmol/L (ref 22–32)
Calcium: 8.5 mg/dL — ABNORMAL LOW (ref 8.9–10.3)
Creatinine, Ser: 0.47 mg/dL — ABNORMAL LOW (ref 0.61–1.24)
GFR calc Af Amer: >60 mL/min (ref >60)
GFR calc non Af Amer: >60 mL/min (ref >60)
Glucose, Bld: 112 mg/dL — ABNORMAL HIGH (ref 65–99)
POTASSIUM: 3.5 mmol/L (ref 3.5–5.1)
Sodium: 132 mmol/L — ABNORMAL LOW (ref 135–145)
Total Bilirubin: 4.4 mg/dL — ABNORMAL HIGH (ref 0.3–1.2)
Total Protein: 6.7 g/dL (ref 6.5–8.1)

## 2016-11-08 LAB — CBC WITH DIFFERENTIAL/PLATELET
Basophils Absolute: 0 10*3/uL (ref 0–0.1)
Basophils Relative: 1 %
EOS PCT: 4 %
Eosinophils Absolute: 0.1 10*3/uL (ref 0–0.7)
HEMATOCRIT: 37.8 % — AB (ref 40.0–52.0)
HEMOGLOBIN: 13.1 g/dL (ref 13.0–18.0)
LYMPHS PCT: 21 %
Lymphs Abs: 0.7 10*3/uL — ABNORMAL LOW (ref 1.0–3.6)
MCH: 34.4 pg — AB (ref 26.0–34.0)
MCHC: 34.7 g/dL (ref 32.0–36.0)
MCV: 99.2 fL (ref 80.0–100.0)
MONOS PCT: 14 %
Monocytes Absolute: 0.5 10*3/uL (ref 0.2–1.0)
Neutro Abs: 2 10*3/uL (ref 1.4–6.5)
Neutrophils Relative %: 60 %
Platelets: 89 10*3/uL — ABNORMAL LOW (ref 150–440)
RBC: 3.81 MIL/uL — AB (ref 4.40–5.90)
RDW: 13.3 % (ref 11.5–14.5)
WBC: 3.3 10*3/uL — AB (ref 3.8–10.6)

## 2016-11-08 LAB — URINALYSIS, COMPLETE (UACMP) WITH MICROSCOPIC
BACTERIA UA: NONE SEEN
BILIRUBIN URINE: NEGATIVE
Glucose, UA: NEGATIVE mg/dL
Hgb urine dipstick: NEGATIVE
KETONES UR: NEGATIVE mg/dL
LEUKOCYTES UA: NEGATIVE
Nitrite: NEGATIVE
Protein, ur: NEGATIVE mg/dL
SQUAMOUS EPITHELIAL / LPF: NONE SEEN
Specific Gravity, Urine: 1.011 (ref 1.005–1.030)
pH: 7 (ref 5.0–8.0)

## 2016-11-08 LAB — TROPONIN I: Troponin I: <0.03 ng/mL (ref <0.03)

## 2016-11-08 MED ORDER — SODIUM CHLORIDE 0.9 % IV BOLUS (SEPSIS)
1000.0000 mL | Freq: Once | INTRAVENOUS | Status: AC
  Administered 2016-11-08: 1000 mL via INTRAVENOUS

## 2016-11-08 MED ORDER — PROMETHAZINE HCL 25 MG PO TABS
25.0000 mg | ORAL_TABLET | Freq: Once | ORAL | Status: AC
  Administered 2016-11-08: 25 mg via ORAL
  Filled 2016-11-08: qty 1

## 2016-11-08 MED ORDER — PROMETHAZINE HCL 12.5 MG PO TABS: 25.0000 mg | ORAL_TABLET | Freq: Four times a day (QID) | ORAL | 0 refills | Status: DC | PRN

## 2016-11-08 MED ORDER — ONDANSETRON HCL 4 MG/2ML IJ SOLN
4.0000 mg | Freq: Once | INTRAMUSCULAR | Status: AC
  Administered 2016-11-08: 4 mg via INTRAVENOUS
  Filled 2016-11-08: qty 2

## 2016-11-08 MED ORDER — ONDANSETRON HCL 4 MG/2ML IJ SOLN
4.0000 mg | Freq: Once | INTRAMUSCULAR | Status: AC
Start: 2016-11-08 — End: 2016-11-08
  Administered 2016-11-08: 4 mg via INTRAVENOUS
  Filled 2016-11-08: qty 2
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
## 2016-11-08 NOTE — ED Triage Notes (Signed)
Per EMS report, patient was seen here yesterday for same complaint and prescribed Zofran. Patient c/o N/V/D and weakness. Patient states he hasn't had alcohol for two days, but did try to drink some beer to "help with the shakes."

## 2016-11-08 NOTE — ED Notes (Signed)
E-signature not obtained due to malfunction of pad.

## 2016-11-08 NOTE — ED Notes (Signed)
Assisted pt with urinal

## 2016-11-08 NOTE — ED Notes (Signed)
Patient given water and saltines.

## 2016-11-08 NOTE — Discharge Instructions (Signed)
You were evaluated for nausea vomiting and diarrhea, and as we discussed her exam and evaluation are reassuring in the emergency department today. You're being prescribed an additional nausea medication. Next  Return to the emergency department for any worsening symptoms including inability to take down food and water, concern for dehydration such as dry mouth or not making urine, or for any concern about alcohol withdrawal including tremors, confusion or altered mental status, dizziness or passing out, fever, or seizure.

## 2016-11-08 NOTE — ED Notes (Signed)
Patient tolerated sips of water and saltines well.

## 2016-11-08 NOTE — ED Notes (Signed)
Patient had difficulty transitioning from stretcher to standing. Patient asked for stretcher to be lowered, but it is at the lowest setting. Patient had tremor that subsided some when standing.Patient had slow, halting gait with walker and two stand-by assists.

## 2016-12-03 DIAGNOSIS — K7581 Nonalcoholic steatohepatitis (NASH): Secondary | ICD-10-CM | POA: Insufficient documentation

## 2016-12-03 DIAGNOSIS — K760 Fatty (change of) liver, not elsewhere classified: Secondary | ICD-10-CM | POA: Insufficient documentation

## 2016-12-17 ENCOUNTER — Inpatient Hospital Stay
Admission: EM | Admit: 2016-12-17 | Discharge: 2016-12-25 | DRG: 922 | Disposition: A | Discharge status: Skilled Nursing Facility | Payer: Medicare Other | Attending: Internal Medicine | Admitting: Internal Medicine

## 2016-12-17 ENCOUNTER — Encounter: Payer: Self-pay | Admitting: Emergency Medicine

## 2016-12-17 ENCOUNTER — Inpatient Hospital Stay: Payer: Medicare Other

## 2016-12-17 DIAGNOSIS — X31XXXA Exposure to excessive natural cold, initial encounter: Secondary | ICD-10-CM | POA: Diagnosis not present

## 2016-12-17 DIAGNOSIS — K7290 Hepatic failure, unspecified without coma: Secondary | ICD-10-CM | POA: Diagnosis present

## 2016-12-17 DIAGNOSIS — K7460 Unspecified cirrhosis of liver: Secondary | ICD-10-CM

## 2016-12-17 DIAGNOSIS — T68XXXA Hypothermia, initial encounter: Principal | ICD-10-CM | POA: Diagnosis present

## 2016-12-17 DIAGNOSIS — G9341 Metabolic encephalopathy: Secondary | ICD-10-CM | POA: Diagnosis present

## 2016-12-17 DIAGNOSIS — J690 Pneumonitis due to inhalation of food and vomit: Secondary | ICD-10-CM | POA: Diagnosis not present

## 2016-12-17 DIAGNOSIS — R230 Cyanosis: Secondary | ICD-10-CM | POA: Diagnosis not present

## 2016-12-17 DIAGNOSIS — Z23 Encounter for immunization: Secondary | ICD-10-CM

## 2016-12-17 DIAGNOSIS — S30810A Abrasion of lower back and pelvis, initial encounter: Secondary | ICD-10-CM | POA: Diagnosis present

## 2016-12-17 DIAGNOSIS — F10231 Alcohol dependence with withdrawal delirium: Secondary | ICD-10-CM | POA: Diagnosis present

## 2016-12-17 DIAGNOSIS — W19XXXA Unspecified fall, initial encounter: Secondary | ICD-10-CM | POA: Diagnosis present

## 2016-12-17 DIAGNOSIS — S70212A Abrasion, left hip, initial encounter: Secondary | ICD-10-CM | POA: Diagnosis present

## 2016-12-17 DIAGNOSIS — S80211A Abrasion, right knee, initial encounter: Secondary | ICD-10-CM | POA: Diagnosis present

## 2016-12-17 DIAGNOSIS — E876 Hypokalemia: Secondary | ICD-10-CM | POA: Diagnosis not present

## 2016-12-17 DIAGNOSIS — I10 Essential (primary) hypertension: Secondary | ICD-10-CM | POA: Diagnosis present

## 2016-12-17 DIAGNOSIS — S70211A Abrasion, right hip, initial encounter: Secondary | ICD-10-CM | POA: Diagnosis present

## 2016-12-17 DIAGNOSIS — Y906 Blood alcohol level of 120-199 mg/100 ml: Secondary | ICD-10-CM | POA: Diagnosis present

## 2016-12-17 DIAGNOSIS — G8191 Hemiplegia, unspecified affecting right dominant side: Secondary | ICD-10-CM | POA: Diagnosis present

## 2016-12-17 DIAGNOSIS — E162 Hypoglycemia, unspecified: Secondary | ICD-10-CM | POA: Diagnosis present

## 2016-12-17 DIAGNOSIS — Z9181 History of falling: Secondary | ICD-10-CM

## 2016-12-17 DIAGNOSIS — Y92009 Unspecified place in unspecified non-institutional (private) residence as the place of occurrence of the external cause: Secondary | ICD-10-CM | POA: Diagnosis not present

## 2016-12-17 DIAGNOSIS — J9600 Acute respiratory failure, unspecified whether with hypoxia or hypercapnia: Secondary | ICD-10-CM | POA: Diagnosis not present

## 2016-12-17 DIAGNOSIS — T17928A Food in respiratory tract, part unspecified causing other injury, initial encounter: Secondary | ICD-10-CM

## 2016-12-17 DIAGNOSIS — F419 Anxiety disorder, unspecified: Secondary | ICD-10-CM | POA: Diagnosis present

## 2016-12-17 DIAGNOSIS — S51012A Laceration without foreign body of left elbow, initial encounter: Secondary | ICD-10-CM | POA: Diagnosis present

## 2016-12-17 DIAGNOSIS — S80212A Abrasion, left knee, initial encounter: Secondary | ICD-10-CM | POA: Diagnosis present

## 2016-12-17 DIAGNOSIS — Z66 Do not resuscitate: Secondary | ICD-10-CM | POA: Diagnosis present

## 2016-12-17 DIAGNOSIS — S90811A Abrasion, right foot, initial encounter: Secondary | ICD-10-CM | POA: Diagnosis present

## 2016-12-17 DIAGNOSIS — T17920A Food in respiratory tract, part unspecified causing asphyxiation, initial encounter: Secondary | ICD-10-CM

## 2016-12-17 DIAGNOSIS — Z79899 Other long term (current) drug therapy: Secondary | ICD-10-CM

## 2016-12-17 DIAGNOSIS — Z7982 Long term (current) use of aspirin: Secondary | ICD-10-CM

## 2016-12-17 DIAGNOSIS — R0600 Dyspnea, unspecified: Secondary | ICD-10-CM

## 2016-12-17 DIAGNOSIS — S90812A Abrasion, left foot, initial encounter: Secondary | ICD-10-CM | POA: Diagnosis present

## 2016-12-17 LAB — CBC WITH DIFFERENTIAL/PLATELET
BASOS ABS: 0 10*3/uL (ref 0–0.1)
BASOS PCT: 0 %
EOS ABS: 0 10*3/uL (ref 0–0.7)
Eosinophils Relative: 0 %
HCT: 44.7 % (ref 40.0–52.0)
HEMOGLOBIN: 14.7 g/dL (ref 13.0–18.0)
Lymphocytes Relative: 11 %
Lymphs Abs: 1.6 10*3/uL (ref 1.0–3.6)
MCH: 35.4 pg — ABNORMAL HIGH (ref 26.0–34.0)
MCHC: 33 g/dL (ref 32.0–36.0)
MCV: 107.3 fL — ABNORMAL HIGH (ref 80.0–100.0)
Monocytes Absolute: 0.9 10*3/uL (ref 0.2–1.0)
Monocytes Relative: 6 %
NEUTROS PCT: 83 %
Neutro Abs: 12.4 10*3/uL — ABNORMAL HIGH (ref 1.4–6.5)
Platelets: 139 10*3/uL — ABNORMAL LOW (ref 150–440)
RBC: 4.16 MIL/uL — AB (ref 4.40–5.90)
RDW: 13.5 % (ref 11.5–14.5)
WBC: 14.9 10*3/uL — AB (ref 3.8–10.6)

## 2016-12-17 LAB — URINALYSIS, COMPLETE (UACMP) WITH MICROSCOPIC
Bilirubin Urine: NEGATIVE
GLUCOSE, UA: NEGATIVE mg/dL
Ketones, ur: 20 mg/dL — AB
LEUKOCYTES UA: NEGATIVE
NITRITE: NEGATIVE
PH: 5 (ref 5.0–8.0)
Protein, ur: 100 mg/dL — AB
Specific Gravity, Urine: 1.014 (ref 1.005–1.030)

## 2016-12-17 LAB — URINE DRUG SCREEN, QUALITATIVE (ARMC ONLY)
AMPHETAMINES, UR SCREEN: NOT DETECTED
Barbiturates, Ur Screen: NOT DETECTED
Benzodiazepine, Ur Scrn: NOT DETECTED
Cannabinoid 50 Ng, Ur ~~LOC~~: NOT DETECTED
Cocaine Metabolite,Ur ~~LOC~~: NOT DETECTED
MDMA (ECSTASY) UR SCREEN: NOT DETECTED
Methadone Scn, Ur: NOT DETECTED
Opiate, Ur Screen: NOT DETECTED
PHENCYCLIDINE (PCP) UR S: NOT DETECTED
Tricyclic, Ur Screen: NOT DETECTED

## 2016-12-17 LAB — COMPREHENSIVE METABOLIC PANEL
ALT: 60 U/L (ref 17–63)
ANION GAP: 15 (ref 5–15)
AST: 284 U/L — ABNORMAL HIGH (ref 15–41)
Albumin: 2.7 g/dL — ABNORMAL LOW (ref 3.5–5.0)
Alkaline Phosphatase: 172 U/L — ABNORMAL HIGH (ref 38–126)
BUN: <5 mg/dL — ABNORMAL LOW (ref 6–20)
CHLORIDE: 104 mmol/L (ref 101–111)
CO2: 16 mmol/L — AB (ref 22–32)
Calcium: 7.4 mg/dL — ABNORMAL LOW (ref 8.9–10.3)
Creatinine, Ser: 0.37 mg/dL — ABNORMAL LOW (ref 0.61–1.24)
GFR calc non Af Amer: >60 mL/min (ref >60)
Glucose, Bld: 25 mg/dL — CL (ref 65–99)
POTASSIUM: 3.2 mmol/L — AB (ref 3.5–5.1)
SODIUM: 135 mmol/L (ref 135–145)
Total Bilirubin: 4.6 mg/dL — ABNORMAL HIGH (ref 0.3–1.2)
Total Protein: 6.6 g/dL (ref 6.5–8.1)

## 2016-12-17 LAB — ACETAMINOPHEN LEVEL

## 2016-12-17 LAB — PROTIME-INR
INR: 2.01
Prothrombin Time: 23.1 seconds — ABNORMAL HIGH (ref 11.4–15.2)

## 2016-12-17 LAB — GLUCOSE, CAPILLARY
GLUCOSE-CAPILLARY: 149 mg/dL — AB (ref 65–99)
Glucose-Capillary: 260 mg/dL — ABNORMAL HIGH (ref 65–99)
Glucose-Capillary: 123 mg/dL — ABNORMAL HIGH (ref 65–99)

## 2016-12-17 LAB — CK: CK TOTAL: 3150 U/L — AB (ref 49–397)

## 2016-12-17 LAB — MAGNESIUM: MAGNESIUM: 1.6 mg/dL — AB (ref 1.7–2.4)

## 2016-12-17 LAB — LACTIC ACID, PLASMA
LACTIC ACID, VENOUS: 9.2 mmol/L — AB (ref 0.5–1.9)
LACTIC ACID, VENOUS: 9.4 mmol/L — AB (ref 0.5–1.9)

## 2016-12-17 LAB — ETHANOL: ALCOHOL ETHYL (B): 141 mg/dL — AB (ref <5)

## 2016-12-17 LAB — MRSA PCR SCREENING: MRSA by PCR: NEGATIVE

## 2016-12-17 LAB — LIPASE, BLOOD: Lipase: 27 U/L (ref 11–51)

## 2016-12-17 LAB — TROPONIN I: TROPONIN I: 0.05 ng/mL — AB (ref <0.03)

## 2016-12-17 MED ORDER — PAROXETINE HCL 10 MG PO TABS
10.0000 mg | ORAL_TABLET | Freq: Every day | ORAL | Status: DC
  Administered 2016-12-17 – 2016-12-25 (×7): 10 mg via ORAL
  Filled 2016-12-17 (×7): qty 1

## 2016-12-17 MED ORDER — DEXTROSE 50 % IV SOLN
INTRAVENOUS | Status: AC
  Administered 2016-12-17: 50 mL via INTRAVENOUS
  Filled 2016-12-17: qty 100

## 2016-12-17 MED ORDER — ADULT MULTIVITAMIN W/MINERALS CH
1.0000 | ORAL_TABLET | Freq: Every day | ORAL | Status: DC
  Administered 2016-12-17 – 2016-12-18 (×2): 1 via ORAL
  Filled 2016-12-17 (×3): qty 1

## 2016-12-17 MED ORDER — SODIUM CHLORIDE 0.9 % IV BOLUS (SEPSIS)
1000.0000 mL | Freq: Once | INTRAVENOUS | Status: AC
  Administered 2016-12-17: 1000 mL via INTRAVENOUS

## 2016-12-17 MED ORDER — ACETAMINOPHEN 325 MG PO TABS: 650.0000 mg | ORAL_TABLET | Freq: Four times a day (QID) | ORAL | Status: DC | PRN

## 2016-12-17 MED ORDER — LORAZEPAM 1 MG PO TABS
1.0000 mg | ORAL_TABLET | Freq: Four times a day (QID) | ORAL | Status: AC | PRN
  Administered 2016-12-18 – 2016-12-19 (×2): 1 mg via ORAL
  Filled 2016-12-17 (×2): qty 1

## 2016-12-17 MED ORDER — FOLIC ACID 1 MG PO TABS
1.0000 mg | ORAL_TABLET | Freq: Every day | ORAL | Status: DC
  Administered 2016-12-17 – 2016-12-25 (×7): 1 mg via ORAL
  Filled 2016-12-17 (×7): qty 1

## 2016-12-17 MED ORDER — VITAMIN B-1 100 MG PO TABS
100.0000 mg | ORAL_TABLET | Freq: Every day | ORAL | Status: DC
  Administered 2016-12-17 – 2016-12-25 (×7): 100 mg via ORAL
  Filled 2016-12-17 (×7): qty 1

## 2016-12-17 MED ORDER — THIAMINE HCL 100 MG/ML IJ SOLN: 100.0000 mg | Freq: Every day | INTRAMUSCULAR | Status: DC

## 2016-12-17 MED ORDER — LISINOPRIL 20 MG PO TABS
20.0000 mg | ORAL_TABLET | Freq: Two times a day (BID) | ORAL | Status: DC
Start: 2016-12-17 — End: 2016-12-25
  Administered 2016-12-17 – 2016-12-25 (×14): 20 mg via ORAL
  Filled 2016-12-17 (×14): qty 1

## 2016-12-17 MED ORDER — ONDANSETRON HCL 4 MG/2ML IJ SOLN
4.0000 mg | Freq: Four times a day (QID) | INTRAMUSCULAR | Status: DC | PRN
Start: 2016-12-17 — End: 2016-12-25
  Administered 2016-12-17 (×2): 4 mg via INTRAVENOUS
  Filled 2016-12-17 (×2): qty 2

## 2016-12-17 MED ORDER — BACLOFEN 10 MG PO TABS
10.0000 mg | ORAL_TABLET | Freq: Three times a day (TID) | ORAL | Status: DC
  Administered 2016-12-17 – 2016-12-25 (×20): 10 mg via ORAL
  Filled 2016-12-17 (×21): qty 1

## 2016-12-17 MED ORDER — DEXTROSE 50 % IV SOLN
1.0000 | Freq: Once | INTRAVENOUS | Status: AC
  Administered 2016-12-17: 50 mL via INTRAVENOUS

## 2016-12-17 MED ORDER — ATENOLOL 25 MG PO TABS
25.0000 mg | ORAL_TABLET | Freq: Two times a day (BID) | ORAL | Status: DC
  Administered 2016-12-17 – 2016-12-25 (×15): 25 mg via ORAL
  Filled 2016-12-17 (×15): qty 1

## 2016-12-17 MED ORDER — THIAMINE HCL 100 MG/ML IJ SOLN
100.0000 mg | Freq: Once | INTRAMUSCULAR | Status: AC
  Administered 2016-12-17: 100 mg via INTRAVENOUS
  Filled 2016-12-17: qty 2

## 2016-12-17 MED ORDER — ONDANSETRON HCL 4 MG PO TABS: 4.0000 mg | ORAL_TABLET | Freq: Four times a day (QID) | ORAL | Status: DC | PRN

## 2016-12-17 MED ORDER — AMLODIPINE BESYLATE 5 MG PO TABS
2.5000 mg | ORAL_TABLET | Freq: Every day | ORAL | Status: DC
  Administered 2016-12-17 – 2016-12-25 (×8): 2.5 mg via ORAL
  Filled 2016-12-17 (×8): qty 1

## 2016-12-17 MED ORDER — TETANUS-DIPHTH-ACELL PERTUSSIS 5-2.5-18.5 LF-MCG/0.5 IM SUSP
0.5000 mL | Freq: Once | INTRAMUSCULAR | Status: AC
  Administered 2016-12-17: 0.5 mL via INTRAMUSCULAR
  Filled 2016-12-17: qty 0.5

## 2016-12-17 MED ORDER — LORAZEPAM 2 MG/ML IJ SOLN
1.0000 mg | Freq: Four times a day (QID) | INTRAMUSCULAR | Status: AC | PRN
  Administered 2016-12-17 – 2016-12-20 (×3): 1 mg via INTRAVENOUS
  Filled 2016-12-17 (×3): qty 1

## 2016-12-17 MED ORDER — LIDOCAINE HCL (PF) 1 % IJ SOLN
INTRAMUSCULAR | Status: AC
  Filled 2016-12-17: qty 10

## 2016-12-17 MED ORDER — HEPARIN SODIUM (PORCINE) 5000 UNIT/ML IJ SOLN
5000.0000 [IU] | Freq: Three times a day (TID) | INTRAMUSCULAR | Status: DC
  Administered 2016-12-17 – 2016-12-22 (×14): 5000 [IU] via SUBCUTANEOUS
  Filled 2016-12-17 (×14): qty 1

## 2016-12-17 MED ORDER — CLOTRIMAZOLE 1 % EX CREA
TOPICAL_CREAM | Freq: Two times a day (BID) | CUTANEOUS | Status: DC
  Administered 2016-12-17 – 2016-12-24 (×13): via TOPICAL
  Filled 2016-12-17: qty 15

## 2016-12-17 MED ORDER — SODIUM CHLORIDE 0.9 % IV SOLN
1.0000 mg | Freq: Once | INTRAVENOUS | Status: AC
  Administered 2016-12-17: 1 mg via INTRAVENOUS
  Filled 2016-12-17: qty 0.2

## 2016-12-17 MED ORDER — ADULT MULTIVITAMIN W/MINERALS CH
1.0000 | ORAL_TABLET | Freq: Every day | ORAL | Status: DC
  Administered 2016-12-17 – 2016-12-25 (×7): 1 via ORAL
  Filled 2016-12-17 (×5): qty 1

## 2016-12-17 MED ORDER — ACETAMINOPHEN 650 MG RE SUPP: 650.0000 mg | Freq: Four times a day (QID) | RECTAL | Status: DC | PRN

## 2016-12-17 MED ORDER — SODIUM CHLORIDE 0.9 % IV SOLN
INTRAVENOUS | Status: DC
  Administered 2016-12-17 – 2016-12-20 (×6): via INTRAVENOUS

## 2016-12-17 MED ORDER — SODIUM CHLORIDE 0.9% FLUSH
3.0000 mL | Freq: Two times a day (BID) | INTRAVENOUS | Status: DC
  Administered 2016-12-17 – 2016-12-25 (×15): 3 mL via INTRAVENOUS

## 2016-12-17 MED ORDER — POTASSIUM CHLORIDE CRYS ER 20 MEQ PO TBCR
40.0000 meq | EXTENDED_RELEASE_TABLET | Freq: Once | ORAL | Status: AC
  Administered 2016-12-17: 40 meq via ORAL
  Filled 2016-12-17: qty 2

## 2016-12-17 MED ORDER — ASPIRIN EC 81 MG PO TBEC
81.0000 mg | DELAYED_RELEASE_TABLET | Freq: Every day | ORAL | Status: DC
  Administered 2016-12-17 – 2016-12-25 (×7): 81 mg via ORAL
  Filled 2016-12-17 (×7): qty 1

## 2016-12-17 MED ORDER — MAGNESIUM SULFATE 2 GM/50ML IV SOLN
2.0000 g | Freq: Once | INTRAVENOUS | Status: AC
  Administered 2016-12-17: 2 g via INTRAVENOUS
  Filled 2016-12-17: qty 50

## 2016-12-17 NOTE — Progress Notes (Signed)
Per Dr Verdell Carmine it is ok to discontinue foley catheter; patient request.  Foley emptied of 300 ccs and DCd.  Pt reports he can use urinal.  Urinal at bedside
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
## 2016-12-17 NOTE — Progress Notes (Signed)
PT Cancellation Note  Patient Details Name: Douglas Peterson MRN: [id number] DOB: 09/03/1951   Cancelled Treatment:    Reason Eval/Treat Not Completed: Patient declined, no reason specified. Patient reports he is feeling sick and nauseated and asks PT to return at later time/date.    Royce Macadamia PT, DPT, CSCS    12/17/2016, 1:49 PM

## 2016-12-17 NOTE — Clinical Social Work Note (Signed)
Nursing consulted CSW for placement. Patient has been admitted to ICU today from home. Patient with ETOH abuse and history of falls. Patient has PT pending and thus recommendations for level of care are pending. CSW will await PT recommendations. Shela Leff MSW,LCSW [phone]
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
## 2016-12-17 NOTE — Progress Notes (Signed)
Pt has remained alert and oriented to self and situation only. NSR/ST on cardiac monitor, BP WNL. Pt has remained on RA. RR even and unlabored. Lung sounds clear to auscultation.  Pt with c/o of frequent nausea-no active vomiting-Zofran given x 2- Zofran was slightly effective; however did not completely rid the nausea.  Daughter with concerns of pt living conditions and need for SW consult. Daughter would like to assess for possible short-term SNF placement. Daughter was teary-eyed about situation. Daughter stated pt lives alone.  Elbow wounds redressed. Pt with multiple scattered abrasions to back, bilat knees and feet. Bruises to bilat hips. Pt with ABD ultrasound in AM.

## 2016-12-18 ENCOUNTER — Inpatient Hospital Stay: Payer: Medicare Other

## 2016-12-18 LAB — CBC
HCT: 33.2 % — ABNORMAL LOW (ref 40.0–52.0)
HEMOGLOBIN: 11.7 g/dL — AB (ref 13.0–18.0)
MCH: 36.7 pg — AB (ref 26.0–34.0)
MCHC: 35.3 g/dL (ref 32.0–36.0)
MCV: 104.1 fL — ABNORMAL HIGH (ref 80.0–100.0)
PLATELETS: 83 10*3/uL — AB (ref 150–440)
RBC: 3.19 MIL/uL — AB (ref 4.40–5.90)
RDW: 13.1 % (ref 11.5–14.5)
WBC: 8.8 10*3/uL (ref 3.8–10.6)

## 2016-12-18 LAB — GLUCOSE, CAPILLARY
GLUCOSE-CAPILLARY: 103 mg/dL — AB (ref 65–99)
Glucose-Capillary: 14 mg/dL — CL (ref 65–99)

## 2016-12-18 LAB — COMPREHENSIVE METABOLIC PANEL
ALBUMIN: 2.3 g/dL — AB (ref 3.5–5.0)
ALT: 68 U/L — AB (ref 17–63)
AST: 364 U/L — AB (ref 15–41)
Alkaline Phosphatase: 151 U/L — ABNORMAL HIGH (ref 38–126)
Anion gap: 5 (ref 5–15)
BUN: 7 mg/dL (ref 6–20)
CALCIUM: 7.3 mg/dL — AB (ref 8.9–10.3)
CHLORIDE: 109 mmol/L (ref 101–111)
CO2: 20 mmol/L — AB (ref 22–32)
CREATININE: 0.52 mg/dL — AB (ref 0.61–1.24)
GFR calc non Af Amer: >60 mL/min (ref >60)
GLUCOSE: 82 mg/dL (ref 65–99)
Potassium: 4.9 mmol/L (ref 3.5–5.1)
SODIUM: 134 mmol/L — AB (ref 135–145)
Total Bilirubin: 4.3 mg/dL — ABNORMAL HIGH (ref 0.3–1.2)
Total Protein: 5.8 g/dL — ABNORMAL LOW (ref 6.5–8.1)

## 2016-12-18 LAB — AMMONIA: Ammonia: 62 umol/L — ABNORMAL HIGH (ref 9–35)
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
## 2016-12-18 NOTE — Progress Notes (Signed)
[facility] [place] NAME: Douglas Peterson    MR#:  [id number]  DATE OF BIRTH:  1951/07/29  SUBJECTIVE:   Patient admitted yesterday secondary to hypothermia. Temperature much improved and back to baseline. Hemodynamically stable. No other acute events overnight arrhythmias.   REVIEW OF SYSTEMS:    Review of Systems  Constitutional: Negative for chills and fever.  HENT: Negative for congestion and tinnitus.   Eyes: Negative for blurred vision and double vision.  Respiratory: Negative for cough, shortness of breath and wheezing.   Cardiovascular: Negative for chest pain, orthopnea and PND.  Gastrointestinal: Negative for abdominal pain, diarrhea, nausea and vomiting.  Genitourinary: Negative for dysuria and hematuria.  Neurological: Positive for weakness. Negative for dizziness, sensory change and focal weakness.  All other systems reviewed and are negative.   Nutrition: Heart healthy Tolerating Diet: Yes Tolerating PT: await Eval.   DRUG ALLERGIES:  No Known Allergies  VITALS:  Blood pressure 124/86, pulse 82, temperature 98 F (36.7 C), temperature source Oral, resp. rate (!) 28, height 5' 6"  (1.676 m), weight 61.2 kg (135 lb), SpO2 100 %.  PHYSICAL EXAMINATION:   Physical Exam   GENERAL:  66 y.o.-year-old patient lying in the bed in no acute distress.  EYES: Pupils equal, round, reactive to light and accommodation. No scleral icterus. Extraocular muscles intact.  HEENT: Head atraumatic, normocephalic. Oropharynx and nasopharynx clear. No oropharyngeal erythema, moist oral mucosa  NECK:  Supple, no jugular venous distention. No thyroid enlargement, no tenderness.  LUNGS: Normal breath sounds bilaterally, no wheezing, rales, rhonchi. No use of accessory muscles of respiration.  CARDIOVASCULAR: S1, S2 RRR. No murmurs, rubs, gallops, clicks.  ABDOMEN: Soft, nontender, nondistended. Bowel sounds present. No organomegaly or mass.   EXTREMITIES: No pedal edema, cyanosis, or clubbing. + 2 pedal & radial pulses b/l.  Bruising noted on b/l knees, feet and also a small laceration on the left elbow which has been sutured.    NEUROLOGIC: Cranial nerves II through XII are intact. No focal Motor or sensory deficits appreciated b/l. Globally weak.  PSYCHIATRIC: The patient is alert and oriented x 3.  SKIN: No obvious rash, lesion, or ulcer.    LABORATORY PANEL:   CBC  Recent Labs Lab 12/18/16 0400  WBC 8.8  HGB 11.7*  HCT 33.2*  PLT 83*   ------------------------------------------------------------------------------------------------------------------  Chemistries   Recent Labs Lab 12/17/16 0848 12/18/16 0400  NA 135 134*  K 3.2* 4.9  CL 104 109  CO2 16* 20*  GLUCOSE 25* 82  BUN <5* 7  CREATININE 0.37* 0.52*  CALCIUM 7.4* 7.3*  MG 1.6*  --   AST 284* 364*  ALT 60 68*  ALKPHOS 172* 151*  BILITOT 4.6* 4.3*   ------------------------------------------------------------------------------------------------------------------  Cardiac Enzymes  Recent Labs Lab 12/17/16 0848  TROPONINI 0.05*   ------------------------------------------------------------------------------------------------------------------  RADIOLOGY:  Dg Elbow Complete Left  Result Date: 12/17/2016 CLINICAL DATA:  Pain with questionable fall EXAM: LEFT ELBOW - COMPLETE 3+ VIEW COMPARISON:  None. FINDINGS: Frontal, lateral, and bilateral oblique views were obtained. Bones are osteoporotic. There is no evident fracture or dislocation. No joint effusion. There is mild generalized osteoarthritic change. No erosive change. There is apparent soft tissue injury in the elbow region. IMPRESSION: Soft tissue injury. No fracture or dislocation evident. Mild osteoarthritic change. Bones osteoporotic. Electronically Signed   By: Lowella Grip III M.D.   On: 12/17/2016 11:03   Dg Elbow Complete Right  Result Date: 12/17/2016 CLINICAL DATA:  Pain  following fall EXAM: RIGHT ELBOW - COMPLETE 3+ VIEW COMPARISON:  None. FINDINGS: Frontal, lateral, and bilateral oblique views were obtained. Bones are osteoporotic. No evident fracture or dislocation. No joint effusion. The joint spaces appear normal. No erosive change. IMPRESSION: No evident fracture or dislocation. No appreciable arthropathic change. Bones osteoporotic. Electronically Signed   By: Lowella Grip III M.D.   On: 12/17/2016 11:07   Ct Head Wo Contrast  Result Date: 12/17/2016 CLINICAL DATA:  Found unresponsive by a neighbor on the porch, hypothermic at 89 degrees, confusion, bruising, history alcohol abuse, hypertension, former smoker EXAM: CT HEAD WITHOUT CONTRAST CT CERVICAL SPINE WITHOUT CONTRAST TECHNIQUE: Multidetector CT imaging of the head and cervical spine was performed following the standard protocol without intravenous contrast. Multiplanar CT image reconstructions of the cervical spine were also generated. COMPARISON:  None FINDINGS: CT HEAD FINDINGS Brain: Generalized atrophy. Normal ventricular morphology. No midline shift or mass effect. Small vessel chronic ischemic changes of deep cerebral white matter. No intracranial hemorrhage, mass lesion, evidence of acute infarction, or extra-axial fluid collection. Vascular: Minimal atherosclerotic calcifications at the carotid siphons Skull: Intact Sinuses/Orbits: Clear Other: N/A CT CERVICAL SPINE FINDINGS Alignment: Normal Skull base and vertebrae: Visualized skullbase intact. Vertebral body heights maintained. No fracture or bone destruction. Scattered facet degenerative changes. Soft tissues and spinal canal: Prevertebral soft tissues normal thickness. Disc levels: Disc space narrowing with endplate spur formation at C5-C6 and C6-C7, less at C4-C5. Upper chest: Lung apices clear Other: N/A IMPRESSION: Atrophy with small vessel chronic ischemic changes of deep cerebral white matter. No acute intracranial abnormalities. Degenerative  disc and facet disease changes cervical spine. No acute cervical spine abnormalities. Electronically Signed   By: Lavonia Dana M.D.   On: 12/17/2016 10:41   Ct Cervical Spine Wo Contrast  Result Date: 12/17/2016 CLINICAL DATA:  Found unresponsive by a neighbor on the porch, hypothermic at 36 degrees, confusion, bruising, history alcohol abuse, hypertension, former smoker EXAM: CT HEAD WITHOUT CONTRAST CT CERVICAL SPINE WITHOUT CONTRAST TECHNIQUE: Multidetector CT imaging of the head and cervical spine was performed following the standard protocol without intravenous contrast. Multiplanar CT image reconstructions of the cervical spine were also generated. COMPARISON:  None FINDINGS: CT HEAD FINDINGS Brain: Generalized atrophy. Normal ventricular morphology. No midline shift or mass effect. Small vessel chronic ischemic changes of deep cerebral white matter. No intracranial hemorrhage, mass lesion, evidence of acute infarction, or extra-axial fluid collection. Vascular: Minimal atherosclerotic calcifications at the carotid siphons Skull: Intact Sinuses/Orbits: Clear Other: N/A CT CERVICAL SPINE FINDINGS Alignment: Normal Skull base and vertebrae: Visualized skullbase intact. Vertebral body heights maintained. No fracture or bone destruction. Scattered facet degenerative changes. Soft tissues and spinal canal: Prevertebral soft tissues normal thickness. Disc levels: Disc space narrowing with endplate spur formation at C5-C6 and C6-C7, less at C4-C5. Upper chest: Lung apices clear Other: N/A IMPRESSION: Atrophy with small vessel chronic ischemic changes of deep cerebral white matter. No acute intracranial abnormalities. Degenerative disc and facet disease changes cervical spine. No acute cervical spine abnormalities. Electronically Signed   By: Lavonia Dana M.D.   On: 12/17/2016 10:41   US Abdomen Complete  Result Date: 12/18/2016 CLINICAL DATA:  Cirrhosis of the liver EXAM: ABDOMEN ULTRASOUND COMPLETE COMPARISON:   09/14/2015 FINDINGS: Gallbladder: Moderately well distended, filled with sludge. Minimal gallbladder wall thickening and pericholecystic fluid. No sonographic Murphy sign or shadowing calculi. Common bile duct: Diameter: 4 mm diameter , normal Liver: Heterogeneous increased hepatic echogenicity which can be seen with  cirrhosis and fatty infiltration as well as certain infiltrative disorders. No discrete hepatic mass or nodularity visualized. Was unable to definitively demonstrate presence and direction of portal venous flow. IVC: Normal appearance Pancreas: Obscured by bowel gas Spleen: Normal appearance, 6.0 cm length Right Kidney: Length: 10.7 cm. Normal morphology without mass or hydronephrosis. Left Kidney: Length: 11.3 cm. Normal morphology without mass or hydronephrosis. Abdominal aorta: Normal caliber Other findings: No free fluid IMPRESSION: Heterogeneous decreased hepatic echogenicity question fatty infiltration versus cirrhosis. Was unable to confirm presence of an direction of portal venous flow, uncertain if related to artifact due to the sound attenuation or related to flow abnormalities; if confirmation of the presence of portal venous patency is required recommend CT with contrast. Nonvisualization of pancreas. Electronically Signed   By: Lavonia Dana M.D.   On: 12/18/2016 11:55     ASSESSMENT AND PLAN:   66 year old male with past medical history of alcohol abuse, hypertension who presents to the hospital as he was found unresponsive and noted to be hypothermic.  1. Altered mental status-patient was found unresponsive, altered. This is metabolic encephalopathy secondary to the hypothermia. - mental status much improved and back to baseline now as pt's coreg temp. Has improved.  -No evidence of head trauma. CT head & Cervical spine (-) for acute pathology.  2. Hypothermia-secondary to patient being out in cold temperatures last night. - much improved w/ warming blanket, bear hugger.  No  evidence of sepsis.  - Temp. Stable now.   3. Alcohol abuse- cont. CIWA.    4. Recurrent falls- await PT eval.  -social work consult as patient may likely need placement.  5. Essential hypertension-continue atenolol, lisinopril, Norvasc.  6. Anxiety-continue Paxil.  7. 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  - Abdominal US suggestive of Cirrhosis due to ETOh abuse.  Coagulopathic with INR over 2.  No acute bleeding.  - I will check ammonia.  Follow LFT's  Transfer to floor.   All the records are reviewed and case discussed with Care Management/Social Worker. Management plans discussed with the patient, family and they are in agreement.  CODE STATUS: Full Code  DVT Prophylaxis: Hep SQ  TOTAL TIME TAKING CARE OF THIS PATIENT: 30 minutes.   POSSIBLE D/C IN 1-2 DAYS, DEPENDING ON CLINICAL CONDITION.   Henreitta Leber M.D on 12/18/2016 at 2:43 PM  Between 7am to 6pm - Pager - [phone]  After 6pm go to [email or web address] - Proofreader  [facility]  Office  [phone]  CC: Primary care physician; [name], MD

## 2016-12-18 NOTE — Progress Notes (Signed)
Patient alert and oriented x4. Forgetfulness noted AEB repetitive conversations with small changes in details. VSS. Remains on room air. Medicated with ativan once for nausea, relief verbalized and no further c/o nausea. CIWA scores 3 and 1. Left elbow redressed d/t drainage saturating through. Right elbow dressing remains clean dry and intact. Patient NPO at this time abdominal ultrasound pending for this morning. Urine output about 100 mL for the shift bladder scan twice during night first occurrence 0 mL in bladder and second occurrence with 5 mL. IV fluids continued as ordered.
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
## 2016-12-19 LAB — COMPREHENSIVE METABOLIC PANEL
ALBUMIN: 2.4 g/dL — AB (ref 3.5–5.0)
ALT: 72 U/L — ABNORMAL HIGH (ref 17–63)
ANION GAP: 5 (ref 5–15)
AST: 306 U/L — ABNORMAL HIGH (ref 15–41)
Alkaline Phosphatase: 145 U/L — ABNORMAL HIGH (ref 38–126)
BUN: 11 mg/dL (ref 6–20)
CO2: 23 mmol/L (ref 22–32)
Calcium: 7.6 mg/dL — ABNORMAL LOW (ref 8.9–10.3)
Chloride: 109 mmol/L (ref 101–111)
Creatinine, Ser: 0.41 mg/dL — ABNORMAL LOW (ref 0.61–1.24)
GFR calc Af Amer: >60 mL/min (ref >60)
GFR calc non Af Amer: >60 mL/min (ref >60)
GLUCOSE: 75 mg/dL (ref 65–99)
POTASSIUM: 3.5 mmol/L (ref 3.5–5.1)
SODIUM: 137 mmol/L (ref 135–145)
Total Bilirubin: 5.5 mg/dL — ABNORMAL HIGH (ref 0.3–1.2)
Total Protein: 5.9 g/dL — ABNORMAL LOW (ref 6.5–8.1)

## 2016-12-19 LAB — PROTIME-INR
INR: 2.17
PROTHROMBIN TIME: 24.5 s — AB (ref 11.4–15.2)

## 2016-12-19 MED ORDER — PNEUMOCOCCAL VAC POLYVALENT 25 MCG/0.5ML IJ INJ
0.5000 mL | INJECTION | INTRAMUSCULAR | Status: AC
  Administered 2016-12-21: 0.5 mL via INTRAMUSCULAR
  Filled 2016-12-19: qty 0.5

## 2016-12-19 MED ORDER — LACTULOSE 10 GM/15ML PO SOLN
30.0000 g | Freq: Two times a day (BID) | ORAL | Status: DC
  Administered 2016-12-19 – 2016-12-25 (×10): 30 g via ORAL
  Filled 2016-12-19 (×10): qty 60

## 2016-12-19 MED ORDER — INFLUENZA VAC SPLIT QUAD 0.5 ML IM SUSY
0.5000 mL | PREFILLED_SYRINGE | INTRAMUSCULAR | Status: AC
  Administered 2016-12-21: 09:00:00 0.5 mL via INTRAMUSCULAR
  Filled 2016-12-19 (×2): qty 0.5
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
## 2016-12-19 NOTE — NC FL2 (Signed)
[place] LEVEL OF CARE SCREENING TOOL     IDENTIFICATION  Patient Name: Douglas Peterson Birthdate: 1951/04/20 Sex: male Admission Date (Current Location): 12/17/2016  [place] and [place] Number:  Engineering geologist and Address:  [facility], [address] [place], [place] [address]      Provider Number: [id number]  Attending Physician Name and Address:  Henreitta Leber, MD  Relative Name and Phone Number:  [facility] Daughter   [phone] or Angas, Isabell Sister   [phone]     Current Level of Care: Hospital Recommended Level of Care: [place] Prior Approval Number:    Date Approved/Denied:   PASRR Number: [id number]  Discharge Plan: SNF    Current Diagnoses: Patient Active Problem List   Diagnosis Date Noted  . Hypothermia 12/17/2016  . Alcohol use   . Diarrhea   . Hyponatremia 07/09/2015    Orientation RESPIRATION BLADDER Height & Weight     Self  Normal Incontinent Weight: 135 lb (61.2 kg) Height:  5' 6"  (167.6 cm)  BEHAVIORAL SYMPTOMS/MOOD NEUROLOGICAL BOWEL NUTRITION STATUS      Continent Diet (Cardiac)  AMBULATORY STATUS COMMUNICATION OF NEEDS Skin   Limited Assist Verbally Normal                       Personal Care Assistance Level of Assistance  Bathing, Feeding, Dressing Bathing Assistance: Limited assistance Feeding assistance: Independent Dressing Assistance: Limited assistance     Functional Limitations Info  Sight, Hearing, Speech Sight Info: Adequate Hearing Info: Adequate Speech Info: Adequate    SPECIAL CARE FACTORS FREQUENCY  PT (By licensed PT)     PT Frequency: 5x a week              Contractures      Additional Factors Info  Code Status, Allergies Code Status Info: Full Code Allergies Info: NKA Psychotropic Info: PARoxetine (PAXIL) tablet 10 mg         Current Medications (12/19/2016):  This is the current hospital active medication  list Current Facility-Administered Medications  Medication Dose Route Frequency Provider Last Rate Last Dose  . 0.9 %  sodium chloride infusion   Intravenous Continuous Henreitta Leber, MD 100 mL/hr at 12/19/16 1543    . acetaminophen (TYLENOL) tablet 650 mg  650 mg Oral Q6H PRN Henreitta Leber, MD       Or  . acetaminophen (TYLENOL) suppository 650 mg  650 mg Rectal Q6H PRN Henreitta Leber, MD      . amLODipine (NORVASC) tablet 2.5 mg  2.5 mg Oral Daily Henreitta Leber, MD   2.5 mg at 12/19/16 0921  . aspirin EC tablet 81 mg  81 mg Oral Daily Henreitta Leber, MD   81 mg at 12/19/16 2202  . atenolol (TENORMIN) tablet 25 mg  25 mg Oral BID Henreitta Leber, MD   25 mg at 12/19/16 5427  . baclofen (LIORESAL) tablet 10 mg  10 mg Oral TID Henreitta Leber, MD   10 mg at 12/19/16 1653  . clotrimazole (LOTRIMIN) 1 % cream   Topical BID Henreitta Leber, MD      . folic acid (FOLVITE) tablet 1 mg  1 mg Oral Daily Henreitta Leber, MD   1 mg at 12/19/16 0921  . heparin injection 5,000 Units  5,000 Units Subcutaneous Q8H Henreitta Leber, MD   5,000 Units at 12/19/16 1357  . [START ON 12/20/2016]  Influenza vac split quadrivalent PF (FLUARIX) injection 0.5 mL  0.5 mL Intramuscular Tomorrow-1000 Henreitta Leber, MD      . lactulose (CHRONULAC) 10 GM/15ML solution 30 g  30 g Oral BID Henreitta Leber, MD   30 g at 12/19/16 1358  . lisinopril (PRINIVIL,ZESTRIL) tablet 20 mg  20 mg Oral BID Henreitta Leber, MD   20 mg at 12/19/16 3754  . LORazepam (ATIVAN) tablet 1 mg  1 mg Oral Q6H PRN Henreitta Leber, MD   1 mg at 12/18/16 1959   Or  . LORazepam (ATIVAN) injection 1 mg  1 mg Intravenous Q6H PRN Henreitta Leber, MD   1 mg at 12/19/16 0100  . multivitamin with minerals tablet 1 tablet  1 tablet Oral Daily Henreitta Leber, MD   1 tablet at 12/18/16 1000  . multivitamin with minerals tablet 1 tablet  1 tablet Oral Daily Henreitta Leber, MD   1 tablet at 12/19/16 0920  . ondansetron (ZOFRAN) tablet 4 mg  4 mg Oral Q6H  PRN Henreitta Leber, MD       Or  . ondansetron (ZOFRAN) injection 4 mg  4 mg Intravenous Q6H PRN Henreitta Leber, MD   4 mg at 12/17/16 1813  . PARoxetine (PAXIL) tablet 10 mg  10 mg Oral Daily Henreitta Leber, MD   10 mg at 12/19/16 3606  . [START ON 12/20/2016] pneumococcal 23 valent vaccine (PNU-IMMUNE) injection 0.5 mL  0.5 mL Intramuscular Tomorrow-1000 Henreitta Leber, MD      . sodium chloride flush (NS) 0.9 % injection 3 mL  3 mL Intravenous Q12H Henreitta Leber, MD   3 mL at 12/19/16 0927  . thiamine (VITAMIN B-1) tablet 100 mg  100 mg Oral Daily Henreitta Leber, MD   100 mg at 12/19/16 7703   Or  . thiamine (B-1) injection 100 mg  100 mg Intravenous Daily Henreitta Leber, MD         Discharge Medications: Please see discharge summary for a list of discharge medications.  Relevant Imaging Results:  Relevant Lab Results:   Additional Information SSN [id number]  Ross Ludwig, [place]

## 2016-12-19 NOTE — Progress Notes (Signed)
PT Cancellation Note  Patient Details Name: Douglas Peterson MRN: [id number] DOB: [birth year]   Cancelled Treatment:    Reason Eval/Treat Not Completed: Patient's level of consciousness Pt still very lethargic this afternoon, did spend a little time talking with pt's sister about rehab and other future possibilities.  Kreg Shropshire 12/19/2016, 4:07 PM

## 2016-12-19 NOTE — Progress Notes (Signed)
PT Cancellation Note  Patient Details Name: Cannan Beeck MRN: [id number] DOB: [birth year]   Cancelled Treatment:    Reason Eval/Treat Not Completed: Patient's level of consciousness Spoke with nursing who reports pt has been soundly sleeping all day, got Adivan last night.  Unable to wake patient to work with PT, will try back when pt is appropriate.   Kreg Shropshire 12/19/2016, 12:42 PM

## 2016-12-19 NOTE — Clinical Social Work Placement (Signed)
   CLINICAL SOCIAL WORK PLACEMENT  NOTE  Date:  12/19/2016  Patient Details  Name: Douglas Peterson MRN: [id number] Date of Birth: 20-Dec-1950  Clinical Social Work is seeking post-discharge placement for this patient at the [place] level of care (*CSW will initial, date and re-position this form in  chart as items are completed):  Yes   Patient/family provided with [place] Work Department's list of facilities offering this level of care within the geographic area requested by the patient (or if unable, by the patient's family).  Yes   Patient/family informed of their freedom to choose among providers that offer the needed level of care, that participate in Medicare, Medicaid or managed care program needed by the patient, have an available bed and are willing to accept the patient.  Yes   Patient/family informed of 's ownership interest in [facility] and [facility], as well as of the fact that they are under no obligation to receive care at these facilities.  PASRR submitted to EDS on 12/19/16     PASRR number received on       Existing PASRR number confirmed on 12/19/16     FL2 transmitted to all facilities in geographic area requested by pt/family on 12/19/16     FL2 transmitted to all facilities within larger geographic area on       Patient informed that his/her managed care company has contracts with or will negotiate with certain facilities, including the following:            Patient/family informed of bed offers received.  Patient chooses bed at       Physician recommends and patient chooses bed at      Patient to be transferred to   on  .  Patient to be transferred to facility by       Patient family notified on   of transfer.  Name of family member notified:        PHYSICIAN Please sign FL2     Additional Comment:    _______________________________________________ Ross Ludwig, LCSWA 12/19/2016,  5:25 PM

## 2016-12-19 NOTE — Progress Notes (Signed)
[place] [facility] [place] NAME: Douglas Peterson    MR#:  [id number]  DATE OF BIRTH:  06-25-1951  SUBJECTIVE:   Hypothermia resolved.  Family at bedside.  A bit lethargic and confused this morning.  Ammonia level elevated.   REVIEW OF SYSTEMS:    Review of Systems  Unable to perform ROS: Mental acuity    Nutrition: Heart healthy Tolerating Diet: Yes Tolerating PT: Eval noted.  DRUG ALLERGIES:  No Known Allergies  VITALS:  Blood pressure 129/77, pulse 77, temperature 97.7 F (36.5 C), temperature source Oral, resp. rate 20, height 5' 6"  (1.676 m), weight 61.2 kg (135 lb), SpO2 95 %.  PHYSICAL EXAMINATION:   Physical Exam   GENERAL:  66 y.o.-year-old patient lying in the bed lethargic but follows commands.   EYES: Pupils equal, round, reactive to light and accommodation. + scleral icterus. Extraocular muscles intact.  HEENT: Head atraumatic, normocephalic. Oropharynx and nasopharynx clear. No oropharyngeal erythema, moist oral mucosa  NECK:  Supple, no jugular venous distention. No thyroid enlargement, no tenderness.  LUNGS: Normal breath sounds bilaterally, no wheezing, rales, rhonchi. No use of accessory muscles of respiration.  CARDIOVASCULAR: S1, S2 RRR. No murmurs, rubs, gallops, clicks.  ABDOMEN: Soft, nontender, nondistended. Bowel sounds present. No organomegaly or mass.  EXTREMITIES: No pedal edema, cyanosis, or clubbing. + 2 pedal & radial pulses b/l.  Bruising noted on b/l knees, feet and also a small laceration on the left elbow which has been sutured.    NEUROLOGIC: Cranial nerves II through XII are intact. No focal Motor or sensory deficits appreciated b/l. Globally weak.  PSYCHIATRIC: The patient is alert and oriented x 1.  SKIN: No obvious rash, lesion, or ulcer.    LABORATORY PANEL:   CBC  Recent Labs Lab 12/18/16 0400  WBC 8.8  HGB 11.7*  HCT 33.2*  PLT 83*    ------------------------------------------------------------------------------------------------------------------  Chemistries   Recent Labs Lab 12/17/16 0848  12/19/16 0620  NA 135  < > 137  K 3.2*  < > 3.5  CL 104  < > 109  CO2 16*  < > 23  GLUCOSE 25*  < > 75  BUN <5*  < > 11  CREATININE 0.37*  < > 0.41*  CALCIUM 7.4*  < > 7.6*  MG 1.6*  --   --   AST 284*  < > 306*  ALT 60  < > 72*  ALKPHOS 172*  < > 145*  BILITOT 4.6*  < > 5.5*  < > = values in this interval not displayed. ------------------------------------------------------------------------------------------------------------------  Cardiac Enzymes  Recent Labs Lab 12/17/16 0848  TROPONINI 0.05*   ------------------------------------------------------------------------------------------------------------------  RADIOLOGY:  US Abdomen Complete  Result Date: 12/18/2016 CLINICAL DATA:  Cirrhosis of the liver EXAM: ABDOMEN ULTRASOUND COMPLETE COMPARISON:  09/14/2015 FINDINGS: Gallbladder: Moderately well distended, filled with sludge. Minimal gallbladder wall thickening and pericholecystic fluid. No sonographic Murphy sign or shadowing calculi. Common bile duct: Diameter: 4 mm diameter , normal Liver: Heterogeneous increased hepatic echogenicity which can be seen with cirrhosis and fatty infiltration as well as certain infiltrative disorders. No discrete hepatic mass or nodularity visualized. Was unable to definitively demonstrate presence and direction of portal venous flow. IVC: Normal appearance Pancreas: Obscured by bowel gas Spleen: Normal appearance, 6.0 cm length Right Kidney: Length: 10.7 cm. Normal morphology without mass or hydronephrosis. Left Kidney: Length: 11.3 cm. Normal morphology without mass or hydronephrosis. Abdominal aorta: Normal caliber Other findings: No free fluid IMPRESSION:  Heterogeneous decreased hepatic echogenicity question fatty infiltration versus cirrhosis. Was unable to confirm presence  of an direction of portal venous flow, uncertain if related to artifact due to the sound attenuation or related to flow abnormalities; if confirmation of the presence of portal venous patency is required recommend CT with contrast. Nonvisualization of pancreas. Electronically Signed   By: Lavonia Dana M.D.   On: 12/18/2016 11:55     ASSESSMENT AND PLAN:   66 year old male with past medical history of alcohol abuse, hypertension who presents to the hospital as he was found unresponsive and noted to be hypothermic.  1. Altered mental status-patient was found unresponsive, altered. This is metabolic encephalopathy secondary to the hypothermia.  A bit more lethargic today.  - mental status had improved but a little encephalopathic today. Ammonia level elevated and started on Lactulose.  Suspect Hepatic Encephalopathy.  -No evidence of head trauma. CT head & Cervical spine (-) for acute pathology.  2. Hypothermia-secondary to patient being out in cold temperatures last night. - much improved w/ warming blanket, bear hugger.  No evidence of sepsis. Resolved.  3. Alcohol abuse- cont. CIWA.    4. Recurrent falls- PT eval noted and pt. Will need SNF and social work aware.   5. Essential hypertension-continue atenolol, lisinopril, Norvasc.  6. Anxiety-continue Paxil.  7. 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  - Abdominal US suggestive of Cirrhosis due to ETOh abuse.  Coagulopathic with INR over 2.  No acute bleeding.  - noted to have some Hepatic Encephalopathy. Started on Lactulose and will follow ammonia.   Discussed plan of care with [name] Sister at bedside.   All the records are reviewed and case discussed with Care Management/Social Worker. Management plans discussed with the patient, family and they are in agreement.  CODE STATUS: Full Code  DVT Prophylaxis: Hep SQ  TOTAL TIME TAKING CARE OF THIS PATIENT: 30 minutes.   POSSIBLE D/C IN 1-2 DAYS, DEPENDING ON  CLINICAL CONDITION.   Henreitta Leber M.D on 12/19/2016 at 2:01 PM  Between 7am to 6pm - Pager - [phone]  After 6pm go to [email or web address] - Proofreader  [facility]  Office  [phone]  CC: Primary care physician; [name], MD

## 2016-12-19 NOTE — Care Management Important Message (Addendum)
Important Message  Patient Details  Name: Douglas Peterson MRN: [id number] Date of Birth: 03-Jul-1951   Medicare Important Message Given:  Yes  Initial signed IM printed from Epic and given to patient/ sister.    Katrina Stack, RN 12/19/2016, 4:24 PM

## 2016-12-19 NOTE — Care Management (Signed)
Spoke with patient's sister who is at bedside.  Patient has been informed of his advanced liver disease.  Patient has been sleeping most of day but awoke and to tell his nurse that in August his CT did not show cirrhosis.  Sister has questions about snf- discussed medicare benefit- day 1-20 no copay the  liable for 20% if continues to meet skilled criteria.  Benefit period of 100 days- made it clear this does not mean patient stay 100 days.  Discussed medicaid application .

## 2016-12-19 NOTE — Progress Notes (Signed)
Pt with 2 episodes tonight where he tried to get up and was confused, disoriented to place and time.  Pt stating he wants to leave.  Able to get pt situated back in bed.  Gave Ativan 1 mg early due to CIWA increase of 14 at 1 AM.  Pt fell asleep.  Bed alarm on and frequent rounds. Dorna Bloom RN

## 2016-12-20 LAB — AMMONIA: AMMONIA: 51 umol/L — AB (ref 9–35)
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
## 2016-12-20 NOTE — Progress Notes (Signed)
CIWA (4).  Pt with some confusion, restlessness at start of shift.  Given Ativan po 1 mg x 1.  Pt incontinent of urine. Small soft BM x1 after lactulose given.  Bilateral dressings to elbows with serous drainage saturated through kerlix.  Remove dressings and washed with soap and water.  Applied vasoline gauze over open areas, covered with alginate, ABD and kerlix.  Secured with tape.  Left elbow with sutures and some yellow slough around sutures.  Elevated elbows on pillows.  Pt with +1-+2 pitting edema to hands and arms with various stages of healing to bruises.  Dorna Bloom RN CWOCN

## 2016-12-20 NOTE — Progress Notes (Signed)
PT Cancellation Note  Patient Details Name: Douglas Peterson MRN: [id number] DOB: 09-28-1951   Cancelled Treatment:    Reason Eval/Treat Not Completed: Patient's level of consciousness   Pt lethargic and did not awaken or stir during discussion with sister.  She stated he received [place] earlier and was not able to participate.  Will continue as appropriate.   Chesley Noon 12/20/2016, 11:16 AM

## 2016-12-20 NOTE — Progress Notes (Signed)
Pt with audible expiratory wheeze.  More pronounced when laid flat to change.  Called Dr Estanislado Pandy and received order to stop fluids.  Pt has been drinking fluids and tolerated meals yesterday. Dorna Bloom RN

## 2016-12-20 NOTE — Progress Notes (Signed)
[place] at [place] NAME: Douglas Peterson    MR#:  [id number]  DATE OF BIRTH:  December 29, 1950  SUBJECTIVE:   Pt. Is going through alcohol withdrawal and delirium tremens. Given some Ativan this morning. Remains encephalopathic.  REVIEW OF SYSTEMS:    Review of Systems  Unable to perform ROS: Mental acuity    Nutrition: NPO Tolerating Diet: No Tolerating PT: Eval noted.  DRUG ALLERGIES:  No Known Allergies  VITALS:  Blood pressure (!) 148/99, pulse (!) 104, temperature 98.5 F (36.9 C), temperature source Oral, resp. rate (!) 22, height 5' 6"  (1.676 m), weight 61.2 kg (135 lb), SpO2 94 %.  PHYSICAL EXAMINATION:   Physical Exam   GENERAL:  66 y.o.-year-old patient lying in the bed lethargic/encephalopathic.   EYES: Pupils equal, round, reactive to light and accommodation. + scleral icterus. Extraocular muscles intact.  HEENT: Head atraumatic, normocephalic. Oropharynx and nasopharynx clear. No oropharyngeal erythema, dry oral mucosa  NECK:  Supple, no jugular venous distention. No thyroid enlargement, no tenderness.  LUNGS: Normal breath sounds bilaterally, no wheezing, rales, rhonchi. No use of accessory muscles of respiration.  CARDIOVASCULAR: S1, S2 RRR. No murmurs, rubs, gallops, clicks.  ABDOMEN: Soft, nontender, nondistended. Bowel sounds present. No organomegaly or mass.  EXTREMITIES: No pedal edema, cyanosis, or clubbing. + 2 pedal & radial pulses b/l.  Bruising noted on b/l knees, feet and also a small laceration on the left elbow which has been sutured.    NEUROLOGIC: Cranial nerves II through XII are intact. No focal Motor or sensory deficits appreciated b/l. Globally weak and encephalopathic  PSYCHIATRIC: The patient is alert and oriented x 1.  SKIN: No obvious rash, lesion, or ulcer.    LABORATORY PANEL:   CBC  Recent Labs Lab 12/18/16 0400  WBC 8.8  HGB 11.7*  HCT 33.2*  PLT 83*    ------------------------------------------------------------------------------------------------------------------  Chemistries   Recent Labs Lab 12/17/16 0848  12/19/16 0620  NA 135  < > 137  K 3.2*  < > 3.5  CL 104  < > 109  CO2 16*  < > 23  GLUCOSE 25*  < > 75  BUN <5*  < > 11  CREATININE 0.37*  < > 0.41*  CALCIUM 7.4*  < > 7.6*  MG 1.6*  --   --   AST 284*  < > 306*  ALT 60  < > 72*  ALKPHOS 172*  < > 145*  BILITOT 4.6*  < > 5.5*  < > = values in this interval not displayed. ------------------------------------------------------------------------------------------------------------------  Cardiac Enzymes  Recent Labs Lab 12/17/16 0848  TROPONINI 0.05*   ------------------------------------------------------------------------------------------------------------------  RADIOLOGY:  No results found.   ASSESSMENT AND PLAN:   66 year old male with past medical history of alcohol abuse, hypertension who presents to the hospital as he was found unresponsive and noted to be hypothermic.  1. Altered mental status-patient was found unresponsive, altered. This was metabolic encephalopathy secondary to the hypothermia. Now much worse due to hepatic encephalopathy and ETOH withdrwal.  - remains Encephalopathic/lethargic.  Ammonia level elevated but trending down and cont. Lactulose.   -No evidence of head trauma. CT head & Cervical spine (-) for acute pathology.  2. Hypothermia-secondary to patient being out in cold temperatures last night. - much improved w/ warming blanket, bear hugger.  No evidence of sepsis. Resolved.  3. Alcohol abuse- cont. CIWA.  - cont. ATivan.  If not improving and consider Precedex gtt.  4. Recurrent falls- PT eval noted and pt. Will need SNF and social work aware.   5. Essential hypertension-continue atenolol, lisinopril, Norvasc.  6. Anxiety-continue Paxil.  7. 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  - Abdominal  US suggestive of Cirrhosis due to ETOh abuse.  Coagulopathic with INR over 2.  No acute bleeding.  - noted to have some Hepatic Encephalopathy. Started on Lactulose and will follow ammonia.   Prognosis is poor to guarded and discussed plan of care with pt's sister at bedside and also with daughter over the phone.   All the records are reviewed and case discussed with Care Management/Social Worker. Management plans discussed with the patient, family and they are in agreement.  CODE STATUS: Full Code  DVT Prophylaxis: Hep SQ  TOTAL TIME TAKING CARE OF THIS PATIENT: 30 minutes.   POSSIBLE D/C IN  DAYS, DEPENDING ON CLINICAL CONDITION. Abel Presto J M.D on 12/20/2016 at 1:38 PM  Between 7am to 6pm - Pager - [phone]  After 6pm go to [email or web address] - Proofreader  [facility]  Office  [phone]  CC: Primary care physician; [name], MD

## 2016-12-21 LAB — COMPREHENSIVE METABOLIC PANEL
ALBUMIN: 2.4 g/dL — AB (ref 3.5–5.0)
ALK PHOS: 150 U/L — AB (ref 38–126)
ALT: 76 U/L — ABNORMAL HIGH (ref 17–63)
ANION GAP: 5 (ref 5–15)
AST: 237 U/L — AB (ref 15–41)
BUN: 9 mg/dL (ref 6–20)
CALCIUM: 8 mg/dL — AB (ref 8.9–10.3)
CO2: 27 mmol/L (ref 22–32)
CREATININE: 0.4 mg/dL — AB (ref 0.61–1.24)
Chloride: 110 mmol/L (ref 101–111)
GFR calc Af Amer: >60 mL/min (ref >60)
GFR calc non Af Amer: >60 mL/min (ref >60)
GLUCOSE: 99 mg/dL (ref 65–99)
Potassium: 3.4 mmol/L — ABNORMAL LOW (ref 3.5–5.1)
Sodium: 142 mmol/L (ref 135–145)
TOTAL PROTEIN: 6.3 g/dL — AB (ref 6.5–8.1)
Total Bilirubin: 6.2 mg/dL — ABNORMAL HIGH (ref 0.3–1.2)

## 2016-12-21 LAB — AMMONIA: Ammonia: 38 umol/L — ABNORMAL HIGH (ref 9–35)

## 2016-12-21 NOTE — Progress Notes (Signed)
[place] at [place] NAME: Douglas Peterson    MR#:  [id number]  DATE OF BIRTH:  1951-03-29  SUBJECTIVE:   Much more awake and alert today.  Ammonia level down.  Not requiring any meds for ETOH withdrawal yesterday. Still overall weak. Sister at beside.   REVIEW OF SYSTEMS:    Review of Systems  Constitutional: Negative for chills and fever.  HENT: Negative for congestion and tinnitus.   Eyes: Negative for blurred vision and double vision.  Respiratory: Negative for cough, shortness of breath and wheezing.   Cardiovascular: Negative for chest pain, orthopnea and PND.  Gastrointestinal: Negative for abdominal pain, diarrhea, nausea and vomiting.  Genitourinary: Negative for dysuria and hematuria.  Neurological: Positive for weakness. Negative for dizziness, sensory change and focal weakness.  All other systems reviewed and are negative.   Nutrition: Heart Healthy Tolerating Diet: Yes Tolerating PT: Eval noted.  DRUG ALLERGIES:  No Known Allergies  VITALS:  Blood pressure 131/83, pulse 89, temperature 97.7 F (36.5 C), temperature source Oral, resp. rate (!) 23, height 5' 6"  (1.676 m), weight 61.2 kg (135 lb), SpO2 97 %.  PHYSICAL EXAMINATION:   Physical Exam   GENERAL:  66 y.o.-year-old patient lying in the bed in NAD.    EYES: Pupils equal, round, reactive to light and accommodation. + scleral icterus. Extraocular muscles intact.  HEENT: Head atraumatic, normocephalic. Oropharynx and nasopharynx clear. No oropharyngeal erythema, dry oral mucosa  NECK:  Supple, no jugular venous distention. No thyroid enlargement, no tenderness.  LUNGS: Normal breath sounds bilaterally, no wheezing, rales, rhonchi. No use of accessory muscles of respiration.  CARDIOVASCULAR: S1, S2 RRR. No murmurs, rubs, gallops, clicks.  ABDOMEN: Soft, nontender, nondistended. Bowel sounds present. No organomegaly or mass.  EXTREMITIES: No pedal edema, cyanosis,  or clubbing. + 2 pedal & radial pulses b/l.  Bruising noted on b/l knees, feet and also a small laceration on the left elbow which has been sutured.   NEUROLOGIC: Cranial nerves II through XII are intact. No focal Motor or sensory deficits appreciated b/l. Globally weak and encephalopathic  PSYCHIATRIC: The patient is alert and oriented x 3.  SKIN: No obvious rash, lesion, or ulcer.    LABORATORY PANEL:   CBC  Recent Labs Lab 12/18/16 0400  WBC 8.8  HGB 11.7*  HCT 33.2*  PLT 83*   ------------------------------------------------------------------------------------------------------------------  Chemistries   Recent Labs Lab 12/17/16 0848  12/21/16 0452  NA 135  < > 142  K 3.2*  < > 3.4*  CL 104  < > 110  CO2 16*  < > 27  GLUCOSE 25*  < > 99  BUN <5*  < > 9  CREATININE 0.37*  < > 0.40*  CALCIUM 7.4*  < > 8.0*  MG 1.6*  --   --   AST 284*  < > 237*  ALT 60  < > 76*  ALKPHOS 172*  < > 150*  BILITOT 4.6*  < > 6.2*  < > = values in this interval not displayed. ------------------------------------------------------------------------------------------------------------------  Cardiac Enzymes  Recent Labs Lab 12/17/16 0848  TROPONINI 0.05*   ------------------------------------------------------------------------------------------------------------------  RADIOLOGY:  No results found.   ASSESSMENT AND PLAN:   66 year old male with past medical history of alcohol abuse, hypertension who presents to the hospital as he was found unresponsive and noted to be hypothermic.  1. Altered mental status-patient was found unresponsive, altered. This was metabolic encephalopathy secondary to the hypothermia and then complicated  with hepatic Encephalopathy, ETOH withdrawal.   - Ammonia level improved and mental status improving. Not requiring and ATivan for CIWA overnight.  - follow mental status and ammonia.   2. Hypothermia-secondary to patient being out in cold  temperatures.  - much improved w/ warming blanket, bear hugger. Resolved.  3. Alcohol abuse-was on CIWA but weaned off now.  - not requiring any ATivan overnight.   4. Recurrent falls- PT eval noted and pt. Will need SNF and social work aware.  - encouraged pt. To got SNF as pt. Refuses and wants to go home.    5. Essential hypertension-continue atenolol, lisinopril, Norvasc.  6. Anxiety-continue Paxil.  7. 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  - Abdominal US suggestive of Cirrhosis due to ETOh abuse.  Coagulopathic with INR over 2.  No acute bleeding.  - noted to have some Hepatic Encephalopathy. Cont. Lactulose and ammonia improving.   Discussed plan of care with pt's sister at bedside.   All the records are reviewed and case discussed with Care Management/Social Worker. Management plans discussed with the patient, family and they are in agreement.  CODE STATUS: Full Code  DVT Prophylaxis: Hep SQ  TOTAL TIME TAKING CARE OF THIS PATIENT: 30 minutes.   POSSIBLE D/C IN  DAYS, DEPENDING ON CLINICAL CONDITION.   Henreitta Leber M.D on 12/21/2016 at 10:46 AM  Between 7am to 6pm - Pager - [phone]  After 6pm go to [email or web address] - Proofreader  [facility]  Office  [phone]  CC: Primary care physician; [name], MD

## 2016-12-21 NOTE — Progress Notes (Signed)
CONCERNING: IV to Oral Route Change Policy  RECOMMENDATION: This patient is receiving thiamine by the intravenous route.  Based on criteria approved by the Pharmacy and Therapeutics Committee, the intravenous medication(s) is/are being converted to the equivalent oral dose form(s).   DESCRIPTION: These criteria include:  The patient is eating (either orally or via tube) and/or has been taking other orally administered medications for a least 24 hours  The patient has no evidence of active gastrointestinal bleeding or impaired GI absorption (gastrectomy, short bowel, patient on TNA or NPO).  If you have questions about this conversion, please contact the Pharmacy Department  []   [phone] )  [facility] [x]   [phone] )  [facility] []   [phone] )  [facility] []   [phone] )  [facility] []   [phone] )  [place], [facility] 12/21/2016 8:46 AM

## 2016-12-22 LAB — COMPREHENSIVE METABOLIC PANEL
ALBUMIN: 2.4 g/dL — AB (ref 3.5–5.0)
ALT: 73 U/L — ABNORMAL HIGH (ref 17–63)
ANION GAP: 6 (ref 5–15)
AST: 198 U/L — ABNORMAL HIGH (ref 15–41)
Alkaline Phosphatase: 144 U/L — ABNORMAL HIGH (ref 38–126)
BUN: 8 mg/dL (ref 6–20)
CHLORIDE: 111 mmol/L (ref 101–111)
CO2: 26 mmol/L (ref 22–32)
Calcium: 7.7 mg/dL — ABNORMAL LOW (ref 8.9–10.3)
Creatinine, Ser: 0.33 mg/dL — ABNORMAL LOW (ref 0.61–1.24)
GFR calc Af Amer: >60 mL/min (ref >60)
GFR calc non Af Amer: >60 mL/min (ref >60)
GLUCOSE: 93 mg/dL (ref 65–99)
POTASSIUM: 2.5 mmol/L — AB (ref 3.5–5.1)
SODIUM: 143 mmol/L (ref 135–145)
Total Bilirubin: 6 mg/dL — ABNORMAL HIGH (ref 0.3–1.2)
Total Protein: 5.7 g/dL — ABNORMAL LOW (ref 6.5–8.1)

## 2016-12-22 LAB — MAGNESIUM: Magnesium: 1.6 mg/dL — ABNORMAL LOW (ref 1.7–2.4)

## 2016-12-22 LAB — POTASSIUM: Potassium: 3.6 mmol/L (ref 3.5–5.1)

## 2016-12-22 LAB — AMMONIA: AMMONIA: 46 umol/L — AB (ref 9–35)

## 2016-12-22 MED ORDER — POTASSIUM CHLORIDE CRYS ER 20 MEQ PO TBCR
40.0000 meq | EXTENDED_RELEASE_TABLET | Freq: Once | ORAL | Status: AC
  Administered 2016-12-22: 40 meq via ORAL
  Filled 2016-12-22: qty 2

## 2016-12-22 MED ORDER — MAGNESIUM SULFATE 2 GM/50ML IV SOLN
2.0000 g | Freq: Once | INTRAVENOUS | Status: AC
  Administered 2016-12-22: 2 g via INTRAVENOUS
  Filled 2016-12-22: qty 50

## 2016-12-22 MED ORDER — ENOXAPARIN SODIUM 40 MG/0.4ML ~~LOC~~ SOLN
40.0000 mg | SUBCUTANEOUS | Status: DC
  Administered 2016-12-22 – 2016-12-24 (×3): 40 mg via SUBCUTANEOUS
  Filled 2016-12-22 (×4): qty 0.4

## 2016-12-22 MED ORDER — LORAZEPAM 2 MG/ML IJ SOLN
1.0000 mg | Freq: Once | INTRAMUSCULAR | Status: AC
  Administered 2016-12-22: 17:00:00 1 mg via INTRAVENOUS
  Filled 2016-12-22: qty 1

## 2016-12-22 MED ORDER — SODIUM CHLORIDE 0.9 % IV SOLN
Freq: Once | INTRAVENOUS | Status: AC
  Administered 2016-12-22: 07:00:00 via INTRAVENOUS
  Filled 2016-12-22: qty 1000

## 2016-12-22 NOTE — Progress Notes (Signed)
Patient found at bedside on his knees at 0035. Pt states that he was trying to use the bathroom, urinal was at bedside on the opposite side that the patient was found kneeling in the floor. Pt denied injury. Pt assisted back to bed and vital signs obtained.

## 2016-12-22 NOTE — Progress Notes (Signed)
Per pt's sister, pt is very restless and confused at time.  Attempting to throw his legs over side rails.  Made Dr. Manuella Ghazi aware and verbal order given for 66m ativan IV once, if no improvement, can give 162mmore.  DaClarise CruzRN

## 2016-12-22 NOTE — Progress Notes (Signed)
Family Meeting Note  Advance Directive:yes  Today a meeting took place with the Patient and Sister.  The following clinical team members were present during this meeting:MD  The following were discussed:Patient's diagnosis: , Patient's progosis: > 12 months and Goals for treatment: Continue present management  Additional follow-up to be provided: DNR  Time spent during discussion:20 minutes  [name], MD

## 2016-12-22 NOTE — Care Management Important Message (Signed)
Important Message  Patient Details  Name: Douglas Peterson MRN: [id number] Date of Birth: [birth year]   Medicare Important Message Given:  Yes    Shelbie Ammons, RN 12/22/2016, 8:59 AM

## 2016-12-22 NOTE — Progress Notes (Signed)
Notified patients sister and daughter of patients unwitnessed fall this morning. Patient denies any injury and no injuries noted. Pt continues to deny pain. Patient stated to his sister that "they are making it up, I did not fall." After discussing with family, they feel that the pt is denying b/c he thinks that he did something wrong. I reassured the patient that he did nothing wrong and they we only wanted him to be safe and to use the call light when he needed assistance. Pt then admitted that he slid to edge of the bed because he knew it would set the alarm off. Instructed pt to use call light and had him demonstrate use. Fall policy explained to family and all questions answered.

## 2016-12-22 NOTE — Progress Notes (Signed)
Dr. Estanislado Pandy notified of patients unwitnessed fall. No visible injuries and patient denies pain. No new orders obtained at this time. Nursing supervisor, Webb Silversmith, notified of patients fall also.

## 2016-12-22 NOTE — Progress Notes (Signed)
Douglas [place] at [name] NAME: Douglas Peterson    MR#:  [id number]  DATE OF BIRTH:  February 21, 1951  SUBJECTIVE:  Patient had an unwitnessed fall without any injury last evening, potassium low and patient is complaining of leg cramps.  His sister is at the bedside REVIEW OF SYSTEMS:    Review of Systems  Constitutional: Positive for malaise/fatigue. Negative for chills and fever.  HENT: Negative for congestion and tinnitus.   Eyes: Negative for blurred vision and double vision.  Respiratory: Negative for cough, shortness of breath and wheezing.   Cardiovascular: Negative for chest pain, orthopnea and PND.  Gastrointestinal: Negative for abdominal pain, diarrhea, nausea and vomiting.  Genitourinary: Negative for dysuria and hematuria.  Musculoskeletal: Positive for falls and myalgias.  Neurological: Positive for weakness. Negative for dizziness, sensory change and focal weakness.  All other systems reviewed and are negative.   Nutrition: Heart Healthy Tolerating Diet: Yes Tolerating PT: Eval noted. DRUG ALLERGIES:  No Known Allergies  VITALS:  Blood pressure 123/70, pulse (!) 109, temperature 98.8 F (37.1 C), temperature source Oral, resp. rate 20, height 5' 6"  (1.676 m), weight 61.2 kg (135 lb), SpO2 95 %.  PHYSICAL EXAMINATION:   Physical Exam   GENERAL:  66 y.o.-year-old patient lying in the bed in NAD.    EYES: Pupils equal, round, reactive to light and accommodation. + scleral icterus. Extraocular muscles intact.  HEENT: Head atraumatic, normocephalic. Oropharynx and nasopharynx clear. No oropharyngeal erythema, dry oral mucosa  NECK:  Supple, no jugular venous distention. No thyroid enlargement, no tenderness.  LUNGS: Normal breath sounds bilaterally, no wheezing, rales, rhonchi. No use of accessory muscles of respiration.  CARDIOVASCULAR: S1, S2 RRR. No murmurs, rubs, gallops, clicks.  ABDOMEN: Soft, nontender, nondistended. Bowel  sounds present. No organomegaly or mass.  EXTREMITIES: No pedal edema, cyanosis, or clubbing. + 2 pedal & radial pulses b/l.  Bruising noted on b/l knees, feet and also a small laceration on the left elbow which has been sutured.   NEUROLOGIC: Cranial nerves II through XII are intact. No focal Motor or sensory deficits appreciated b/l. Globally weak and encephalopathic  PSYCHIATRIC: The patient is alert and oriented x 3.  SKIN: No obvious rash, lesion, or ulcer.  LABORATORY PANEL:   CBC  Recent Labs Lab 12/18/16 0400  WBC 8.8  HGB 11.7*  HCT 33.2*  PLT 83*   ------------------------------------------------------------------------------------------------------------------  Chemistries   Recent Labs Lab 12/22/16 0450 12/22/16 1122  NA 143  --   K 2.5* 3.6  CL 111  --   CO2 26  --   GLUCOSE 93  --   BUN 8  --   CREATININE 0.33*  --   CALCIUM 7.7*  --   MG  --  1.6*  AST 198*  --   ALT 73*  --   ALKPHOS 144*  --   BILITOT 6.0*  --    ------------------------------------------------------------------------------------------------------------------  Cardiac Enzymes  Recent Labs Lab 12/17/16 0848  TROPONINI 0.05*    ASSESSMENT AND PLAN:  66 year old male with past medical history of alcohol abuse, hypertension who presents to the hospital as he was found unresponsive and noted to be hypothermic.  1. Altered mental status-patient was found unresponsive, altered. This was metabolic encephalopathy secondary to the hypothermia and then complicated with hepatic Encephalopathy, ETOH withdrawal.   - Ammonia level improved ->46 and mental status improving. Not requiring and ATivan for CIWA overnight.  - follow mental status and ammonia.  -  Had unwitnessed fall last evening. no injury  2.  Hypokalemia/hypomagnesemia -Replete and recheck  3. Alcohol abuse-was on CIWA but weaned off now.  - not requiring any ATivan.  4. Recurrent falls- PT eval noted and pt. Will need  SNF and social work aware.  - encouraged pt. To got SNF as pt. Refuses and wants to go home.   -Patient continues to refuse although I had a long discussion with him and 2 of his sister and they are trying to convince him  5. Essential hypertension-continue atenolol, lisinopril, Norvasc.  6. Anxiety-continue Paxil.  7. 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  - Abdominal US suggestive of Cirrhosis due to ETOh abuse.  Coagulopathic with INR over 2.  No acute bleeding.  - noted to have some Hepatic Encephalopathy. Cont. Lactulose and ammonia is 46 today  8.  Hypothermia-secondary to patient being out in cold temperatures.  - much improved w/ warming blanket, bear hugger. Resolved.  Discussed plan of care with pt's sister at bedside.  And also another one on the phone.  They are continuing to try to convince him to go to rehab   All the records are reviewed and case discussed with Care Management/Social Worker. Management plans discussed with the patient, family and they are in agreement.  CODE STATUS: DNR  DVT Prophylaxis: Hep SQ  TOTAL TIME TAKING CARE OF THIS PATIENT: 30 minutes.   POSSIBLE D/C IN a.m. tomorrow, DEPENDING ON CLINICAL CONDITION.   [name] M.D on 12/22/2016 at 3:07 PM  Between 7am to 6pm - Pager - [phone]  After 6pm go to [email or web address] - Proofreader  [facility]  Office  [phone]  CC: Primary care physician; Douglas Kayser, MD

## 2016-12-22 NOTE — Clinical Social Work Note (Signed)
CSW spoke with pt's daughter and presented bed offers. [name] daughter chose [facility]. CSW updated facility to secure bed. Pt may be ready for discharge tomorrow. CSW will continue to follow.   Darden Dates, MSW, LCSW  Clinical Social Worker  [phone]

## 2016-12-23 ENCOUNTER — Inpatient Hospital Stay: Payer: Medicare Other

## 2016-12-23 DIAGNOSIS — J690 Pneumonitis due to inhalation of food and vomit: Secondary | ICD-10-CM

## 2016-12-23 LAB — BASIC METABOLIC PANEL
ANION GAP: 5 (ref 5–15)
BUN: <5 mg/dL — ABNORMAL LOW (ref 6–20)
CALCIUM: 7.9 mg/dL — AB (ref 8.9–10.3)
CO2: 26 mmol/L (ref 22–32)
Chloride: 110 mmol/L (ref 101–111)
Glucose, Bld: 87 mg/dL (ref 65–99)
Potassium: 3.8 mmol/L (ref 3.5–5.1)
Sodium: 141 mmol/L (ref 135–145)

## 2016-12-23 LAB — CBC
HCT: 40.6 % (ref 40.0–52.0)
Hemoglobin: 13.7 g/dL (ref 13.0–18.0)
MCH: 36.3 pg — ABNORMAL HIGH (ref 26.0–34.0)
MCHC: 33.7 g/dL (ref 32.0–36.0)
MCV: 107.8 fL — ABNORMAL HIGH (ref 80.0–100.0)
PLATELETS: 93 10*3/uL — AB (ref 150–440)
RBC: 3.76 MIL/uL — ABNORMAL LOW (ref 4.40–5.90)
RDW: 13.6 % (ref 11.5–14.5)
WBC: 4.6 10*3/uL (ref 3.8–10.6)

## 2016-12-23 LAB — MAGNESIUM: Magnesium: 1.9 mg/dL (ref 1.7–2.4)

## 2016-12-23 MED ORDER — FOLIC ACID 1 MG PO TABS: 1.0000 mg | ORAL_TABLET | Freq: Every day | ORAL | Status: DC

## 2016-12-23 MED ORDER — BUDESONIDE 0.5 MG/2ML IN SUSP
0.5000 mg | Freq: Two times a day (BID) | RESPIRATORY_TRACT | Status: DC
  Administered 2016-12-23 – 2016-12-25 (×5): 0.5 mg via RESPIRATORY_TRACT
  Filled 2016-12-23 (×5): qty 2

## 2016-12-23 MED ORDER — IPRATROPIUM-ALBUTEROL 0.5-2.5 (3) MG/3ML IN SOLN
RESPIRATORY_TRACT | Status: AC
  Administered 2016-12-23: 3 mL via RESPIRATORY_TRACT
  Filled 2016-12-23: qty 3

## 2016-12-23 MED ORDER — GUAIFENESIN ER 600 MG PO TB12
600.0000 mg | ORAL_TABLET | Freq: Two times a day (BID) | ORAL | Status: DC
  Administered 2016-12-24 – 2016-12-25 (×2): 600 mg via ORAL
  Filled 2016-12-23 (×2): qty 1

## 2016-12-23 MED ORDER — THIAMINE HCL 100 MG PO TABS
100.0000 mg | ORAL_TABLET | Freq: Every day | ORAL | 0 refills | Status: DC
Start: 2016-12-23 — End: 2017-09-25

## 2016-12-23 MED ORDER — LACTULOSE 10 GM/15ML PO SOLN: 30.0000 g | Freq: Two times a day (BID) | ORAL | 0 refills | Status: DC

## 2016-12-23 MED ORDER — IPRATROPIUM-ALBUTEROL 0.5-2.5 (3) MG/3ML IN SOLN
3.0000 mL | Freq: Four times a day (QID) | RESPIRATORY_TRACT | Status: DC
  Administered 2016-12-23: 3 mL via RESPIRATORY_TRACT

## 2016-12-23 MED ORDER — IPRATROPIUM-ALBUTEROL 0.5-2.5 (3) MG/3ML IN SOLN
3.0000 mL | RESPIRATORY_TRACT | Status: DC
  Administered 2016-12-23 – 2016-12-24 (×6): 3 mL via RESPIRATORY_TRACT
  Filled 2016-12-23 (×6): qty 3

## 2016-12-23 MED ORDER — SODIUM CHLORIDE 0.9 % IV SOLN
INTRAVENOUS | Status: DC
  Administered 2016-12-23 – 2016-12-25 (×4): via INTRAVENOUS

## 2016-12-23 MED ORDER — METHYLPREDNISOLONE SODIUM SUCC 40 MG IJ SOLR
40.0000 mg | Freq: Two times a day (BID) | INTRAMUSCULAR | Status: DC
  Administered 2016-12-23 – 2016-12-24 (×4): 40 mg via INTRAVENOUS
  Filled 2016-12-23 (×4): qty 1
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
## 2016-12-23 NOTE — Clinical Social Work Note (Signed)
Clinical Social Work Assessment  Patient Details  Name: Douglas Peterson MRN: [id number] Date of Birth: Feb 17, 1951  Date of referral:  12/23/16               Reason for consult:  Facility Placement, Discharge Planning                Permission sought to share information with:  Family Supports Permission granted to share information::  Yes, Verbal Permission Granted  Name::     Douglas Peterson  Relationship::  daughter  Contact Information:  [phone]  Housing/Transportation Living arrangements for the past 2 months:  [place] of Information:  Patient, Adult Children Patient Interpreter Needed:  None Criminal Activity/Legal Involvement Pertinent to Current Situation/Hospitalization:  No - Comment as needed Significant Relationships:  Adult Children Lives with:  Self Do you feel safe going back to the place where you live?  No (Pt is in need of STR PT rehab.) Need for family participation in patient care:  Yes (Comment)  Care giving concerns:  Pt is in need of a higher level of care.   Social Worker assessment / plan:  CSW met with pt and daughter to address consult for New SNF. CSW introduced herself and explained role of social work. CSW also explained the process of discharging to SNF. Pt is agreeable to going to [facility] and was set for discharge today, however had a period of acute respiratory distress. [name] discharge was cancelled. CSW updated facility, and a bed will be available when pt is stable for discharge. CSW confirmed plan with pt's daughter, who is in agreement. CSW will continue to follow.   Employment status:  Retired Forensic scientist:  Medicare PT Recommendations:  [place] / Referral to community resources:  [place]  Patient/Family's Response to care:  [name] daughter was Patent attorney of CSW support.   Patient/Family's Understanding of and Emotional Response to Diagnosis, Current Treatment,  and Prognosis:  Pt understands that he would benefit from STR.   Emotional Assessment Appearance:  Appears stated age Attitude/Demeanor/Rapport:   (Appropriate) Affect (typically observed):  Accepting, Adaptable, Pleasant Orientation:  Oriented to Self, Oriented to Place, Oriented to  Time, Oriented to Situation Alcohol / Substance use:  Not Applicable Psych involvement (Current and /or in the community):  No (Comment)  Discharge Needs  Concerns to be addressed:  Adjustment to Illness Readmission within the last 30 days:  No Current discharge risk:  Chronically ill Barriers to Discharge:  Continued Medical Work up   [employer], LCSW 12/23/2016, 4:17 PM

## 2016-12-23 NOTE — Progress Notes (Signed)
PT Cancellation Note  Patient Details Name: Mohamad Bruso MRN: [id number] DOB: [birth year]   Cancelled Treatment:    Reason Eval/Treat Not Completed: Medical issues which prohibited therapy  3 attempts this am.  Eating breakfast, getting ready for x-ray and on third attempt, pt's daughter stating that he got "choked up" while taking medication and nursing staff in with pt attending to needs.  Will continue as appropriate.   Chesley Noon, PTA 12/23/16, 11:22 AM
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
## 2016-12-23 NOTE — Progress Notes (Signed)
[facility] at [place] NAME: Douglas Peterson    MR#:  [id number]  DATE OF BIRTH:  1951/03/05  SUBJECTIVE:  Had aspiration event earlier today. Was cyanotic and RN planning to call code blue, some dyspnea REVIEW OF SYSTEMS:    Review of Systems  Constitutional: Positive for malaise/fatigue. Negative for chills and fever.  HENT: Negative for congestion and tinnitus.   Eyes: Negative for blurred vision and double vision.  Respiratory: Negative for cough, shortness of breath and wheezing.   Cardiovascular: Negative for chest pain, orthopnea and PND.  Gastrointestinal: Negative for abdominal pain, diarrhea, nausea and vomiting.  Genitourinary: Negative for dysuria and hematuria.  Musculoskeletal: Positive for falls and myalgias.  Neurological: Positive for weakness. Negative for dizziness, sensory change and focal weakness.  All other systems reviewed and are negative.   Nutrition: Heart Healthy Tolerating Diet: Yes Tolerating PT: Eval noted. DRUG ALLERGIES:  No Known Allergies  VITALS:  Blood pressure 120/80, pulse 80, temperature 98.1 F (36.7 C), resp. rate 20, height 5' 6"  (1.676 m), weight 61.2 kg (135 lb), SpO2 96 %. PHYSICAL EXAMINATION:  Physical Exam   GENERAL:  66 y.o.-year-old patient lying in the bed in Acute resp distress.  EYES: Pupils equal, round, reactive to light and accommodation. + scleral icterus. Extraocular muscles intact.  HEENT: Head atraumatic, normocephalic. Oropharynx and nasopharynx clear. No oropharyngeal erythema, dry oral mucosa  NECK:  Supple, no jugular venous distention. No thyroid enlargement, no tenderness.  LUNGS: Decreased breath sounds bilaterally, mild wheezing, rales, rhonchi. Using accessory muscles of respiration.  CARDIOVASCULAR: S1, S2 RRR. No murmurs, rubs, gallops, clicks.  ABDOMEN: Soft, nontender, nondistended. Bowel sounds present. No organomegaly or mass.  EXTREMITIES: No pedal edema,  cyanosis, or clubbing. + 2 pedal & radial pulses b/l.  Bruising noted on b/l knees, feet and also a small laceration on the left elbow which has been sutured.   NEUROLOGIC: Cranial nerves II through XII are intact. No focal Motor or sensory deficits appreciated b/l. Globally weak and encephalopathic  PSYCHIATRIC: The patient is alert and oriented x 3.  SKIN: No obvious rash, lesion, or ulcer.  LABORATORY PANEL:   CBC  Recent Labs Lab 12/23/16 0657  WBC 4.6  HGB 13.7  HCT 40.6  PLT 93*   ------------------------------------------------------------------------------------------------------------------  Chemistries   Recent Labs Lab 12/22/16 0450  12/23/16 0657  NA 143  --  141  K 2.5*  < > 3.8  CL 111  --  110  CO2 26  --  26  GLUCOSE 93  --  87  BUN 8  --  <5*  CREATININE 0.33*  --  <0.30*  CALCIUM 7.7*  --  7.9*  MG  --   < > 1.9  AST 198*  --   --   ALT 73*  --   --   ALKPHOS 144*  --   --   BILITOT 6.0*  --   --   < > = values in this interval not displayed. ------------------------------------------------------------------------------------------------------------------  Cardiac Enzymes  Recent Labs Lab 12/17/16 0848  TROPONINI 0.05*    ASSESSMENT AND PLAN:  66 year old male with past medical history of alcohol abuse, hypertension who presents to the hospital as he was found unresponsive and noted to be hypothermic.  * Acute resp failure - possible aspiration event - mucinex  - nebs - CXR - Pulmo c/s (d/w Dr Mortimer Fries)  1. Altered mental status-patient was found unresponsive, altered. This was metabolic  encephalopathy secondary to the hypothermia and then complicated with hepatic Encephalopathy, ETOH withdrawal.   - Ammonia level improved ->46 and mental status improving. Not requiring and ATivan for CIWA overnight.  - follow mental status and ammonia.  -Had unwitnessed fall last evening. no injury  2.  Hypokalemia/hypomagnesemia -Replete and  recheck  3. Alcohol abuse-was on CIWA but weaned off now.  - not requiring any ATivan.  4. Recurrent falls- PT eval noted and pt. Will need SNF and social work aware.  - encouraged pt. To got SNF as pt. Refuses and wants to go home.   -Patient continues to refuse although I had a long discussion with him and 2 of his sister and they are trying to convince him  5. Essential hypertension-continue atenolol, lisinopril, Norvasc.  6. Anxiety-continue Paxil.  7. 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  - Abdominal US suggestive of Cirrhosis due to ETOh abuse.  Coagulopathic with INR over 2.  No acute bleeding.  - noted to have some Hepatic Encephalopathy. Cont. Lactulose and ammonia is 46 today  8.  Hypothermia-secondary to patient being out in cold temperatures.  - much improved w/ warming blanket, bear hugger. Resolved.  Discussed plan of care with pt's sister at bedside.  And also another one on the phone.  They are continuing to try to convince him to go to rehab   All the records are reviewed and case discussed with Care Management/Social Worker. Management plans discussed with the patient, family and they are in agreement.  CODE STATUS: DNR  DVT Prophylaxis: Hep SQ  TOTAL TIME (Critical care) TAKING CARE OF THIS PATIENT: 35 minutes.   POSSIBLE D/C IN a.m. tomorrow, DEPENDING ON CLINICAL CONDITION.   [name] M.D on 12/23/2016 at 11:54 AM  Between 7am to 6pm - Pager - [phone]  After 6pm go to [email or web address] - Proofreader  [facility]  Office  [phone]  CC: Primary care physician; [name], MD

## 2016-12-23 NOTE — Progress Notes (Signed)
In with pt giving medications, while pt was drinking lactulose, pt froze and could take breath in, turning purple and trembling.  Staff called to room and pt was able to take a breath after about 30 seconds.  Dr. Manuella Ghazi and respiratory made aware.  This nurse suctioned pt to see if anything in the back of throat.  [name] discharge discontinued per Dr. Manuella Ghazi and .  [name] daughter at bedside during and is aware.

## 2016-12-24 ENCOUNTER — Inpatient Hospital Stay: Payer: Medicare Other

## 2016-12-24 MED ORDER — IPRATROPIUM-ALBUTEROL 0.5-2.5 (3) MG/3ML IN SOLN
3.0000 mL | Freq: Four times a day (QID) | RESPIRATORY_TRACT | Status: DC
  Administered 2016-12-24 – 2016-12-25 (×3): 3 mL via RESPIRATORY_TRACT
  Filled 2016-12-24 (×4): qty 3

## 2016-12-24 MED ORDER — IPRATROPIUM-ALBUTEROL 0.5-2.5 (3) MG/3ML IN SOLN: 3.0000 mL | RESPIRATORY_TRACT | Status: DC | PRN
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
## 2016-12-24 NOTE — Plan of Care (Signed)
Problem: Nutrition: Goal: Adequate nutrition will be maintained Outcome: Not Progressing Pt currently NPO. High aspiration risk. SLP and Barium swallow pending.

## 2016-12-24 NOTE — Progress Notes (Signed)
PT Attempt Note  Patient Details Name: Rj Pedrosa MRN: [id number] DOB: 1951/06/02   Cancelled Treatment:    Reason Eval/Treat Not Completed: Patient at procedure or test/unavailable. Attempted to see patient twice. This morning pt was at barium swallow. Attempted again at 1315 and pt is eating lunch after being NPO due to aspiration event. Pt reassigned to another therapist who will attempt at later time this afternoon if time permits.  Lyndel Safe  PT, DPT   , 12/24/2016, 1:29 PM

## 2016-12-25 MED ORDER — PREDNISONE 10 MG (21) PO TBPK: ORAL_TABLET | ORAL | 0 refills | Status: DC

## 2016-12-25 MED ORDER — AMLODIPINE BESYLATE 2.5 MG PO TABS: 2.5000 mg | ORAL_TABLET | Freq: Every day | ORAL | 0 refills | Status: DC

## 2016-12-25 MED ORDER — CEPHALEXIN 500 MG PO CAPS: 500.0000 mg | ORAL_CAPSULE | Freq: Four times a day (QID) | ORAL | 0 refills | Status: DC

## 2016-12-25 MED ORDER — BUDESONIDE 0.5 MG/2ML IN SUSP: 0.5000 mg | Freq: Two times a day (BID) | RESPIRATORY_TRACT | 12 refills | Status: DC

## 2016-12-25 NOTE — Progress Notes (Signed)
MD making rounds. Order received to discharge to [facility]. IV removed. CSW facilitating transfer to facility. Report called to Elmyra Ricks, Therapist, sports. EMS paged for transport. Awaiting EMS.

## 2016-12-25 NOTE — Care Management Important Message (Signed)
Important Message  Patient Details  Name: Douglas Peterson MRN: [id number] Date of Birth: October 30, 1951   Medicare Important Message Given:  Yes    Shelbie Ammons, RN 12/25/2016, 8:10 AM

## 2016-12-25 NOTE — Clinical Social Work Note (Signed)
Pt is ready for discharge to [facility]. Facility is ready to accept pt as they have received discharge information. [name] daughter is aware and agreeable to discharge plan. RN called report. [facility] will provide transportation. CSW is signing off as needs identified.   Douglas Peterson, MSW, LCSW  Clinical Social Worker [phone]

## 2016-12-25 NOTE — Consult Note (Signed)
[place] Nurse wound consult note Reason for Consult: Fall with full thickness injury to left elbow.  Full thickness abrasions to bilateral feet and toes.  Scabbed and intact with no drainage.  Wound type:Full thickness injuries from fall and hypothermia from exposure after fall.  Pressure Injury POA: N/A Measurement:Left elbow  4 cm x 2.5 cm with 3 sutures in place, skin defect noted around sutures.  Will fill dead space with calcium alginate to promote healing.  Scabbed lesions (>10) to bilateral feet from fall.  Dry and scabbed, intact Wound FBP:PHKF elbow, pink moist and nongranulating Scabbed to bilateral feet.  Drainage (amount, consistency, odor) elbow:  Minimal serosanguinous.. No odor Feet are dry and intact.  Periwound:slight erythema  tender Dressing procedure/placement/frequency:Cleanse left elbow with NS.  Apply calcium alginate to fill dead space.  Cover with 4x4 and kerlix/tape.  Change every other day.   Cleanse feet with soap and water daily.  Leave open to air.  Will not follow at this time.  Please re-consult if needed.  Domenic Moras RN BSN [place] Pager [phone]
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
## 2016-12-25 NOTE — Plan of Care (Signed)
Problem: Physical Regulation: Goal: Will remain free from infection Outcome: Not Progressing WBC count wnl. Laceration to left elbow with purulent drainage, sutures not intact. Also, right elbow abrasion with drainage noted. No dressing changes ordered. Notified Dr Marcille Blanco. Order for wound nurse consult given.   Problem: Skin Integrity: Goal: Risk for impaired skin integrity will decrease Outcome: Not Progressing Left heel with non blanchable redness (stg 1) . Foam dressing intact. Wound care consult ordered. Will continue to monitor.

## 2016-12-25 NOTE — Plan of Care (Signed)
EMS on Unit for transport. Report given to EMS. Discharged via stretcher by EMS. Belongings sent with daughter.
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
## 2016-12-25 NOTE — Discharge Instructions (Addendum)
Alcoholic Liver Disease Introduction Alcoholic liver disease happens when the liver does not work the way it should. The condition is caused by drinking too much alcohol for many years. Follow these instructions at home:  Do not drink alcohol.  Take medicines only as told by your doctor.  Take vitamins only as told by your doctor.  Follow any diet instructions that your doctor gave you. You may need to:  Eat foods that have thiamine. These include whole-wheat cereals, pork, and raw vegetables.  Eat foods that have folic acid. These include vegetables, fruits, meats, beans, nuts, and dairy foods.  Eat foods that are high in carbohydrates. These include yogurt, beans, potatoes, and rice. Contact a doctor if:  You have a fever.  You are short of breath.  You have trouble breathing.  You have bright red blood in your poop (stool).  Your poop looks like tar.  You throw up (vomit) blood.  Your skin looks more yellow, pale, or dark.  You get headaches.  You have trouble thinking.  You have trouble balancing or walking. This information is not intended to replace advice given to you by your health care provider. Make sure you discuss any questions you have with your health care provider. Document Released: 08/17/2009 Document Revised: 03/27/2016 Document Reviewed: 09/21/2014  2017 Elsevier Hepatic Encephalopathy Introduction Hepatic encephalopathy is a loss of brain function from advanced liver disease. The effects of the condition depend on the type of liver damage and how severe it is. In some cases, hepatic encephalopathy can be reversed. What are the causes? The exact cause of hepatic encephalopathy is not known. What increases the risk? You have a higher risk of getting this condition if your liver is damaged. When the liver is damaged harmful substances called toxins can build up in the body. Certain toxins, such as ammonia, can harm your brain. Conditions that can  cause liver damage include:  An infection.  Dehydration.  Intestinal bleeding.  Drinking too much alcohol.  Taking certain medicines, including tranquilizers, water pills (diuretics), antidepressants, or sleeping pills. What are the signs or symptoms? Signs and symptoms may develop suddenly. Or, they may develop slowly and get worse gradually. Symptoms can range from mild to severe. Mild Hepatic Encephalopathy  Mild confusion.  Personality and mood changes.  Anxiety and agitation.  Drowsiness.  Loss of mental abilities.  Musty or sweet-smelling breath. Worsening or Severe Hepatic Encephalopathy  Slowed movement.  Slurred speech.  Extreme personality changes.  Disorientation.  Abnormal shaking or flapping of the hands.  Coma. How is this diagnosed? To make a diagnosis, your health care provider will do a physical exam. To rule out other causes of your signs and symptoms, he or she may order tests. You may have:  Blood tests. These may be done to check your ammonia level, measure how long it takes your blood to clot, and check for infection.  Liver function tests. These may be done to check how well your liver is working.  MRI and CT scans. These may be done to check for a brain disorder.  Electroencephalogram (EEG). This may be done to measure the electrical activity in your brain. How is this treated? The first step in treatment is identifying and treating possible triggers. The next step is involves taking medicine to lower the level of toxins in the body and to prevent ammonia from building up. You may need to take:  Antibiotics to reduce the ammonia-producing bacteria in your gut.  Lactulose  to help flush ammonia from the gut. Follow these instructions at home: Eating and drinking  Follow a low-protein diet that includes plenty of fruits, vegetables, and whole grains, as directed by your health care provider. Ammonia is produced when you digest  high-protein foods.  Work with a Microbiologist or with your health care provider to make sure you are getting the right balance of protein and minerals.  Drink enough fluids to keep your urine clear or pale yellow. Drinking plenty of water helps prevent constipation.  Do not drink alcohol or use illegal drugs. Medicines  Only take medicine as directed by your health care provider.  If you were prescribed an antibiotic medicine, finish it all even if you start to feel better.  Do not start any new medicines, including over-the-counter medicines, without first checking with your health care provider. Contact a health care provider if:  You have new symptoms.  Your symptoms change.  Your symptoms get worse.  You have a fever.  You are constipated.  You have persistent nausea, vomiting, or diarrhea. Get help right away if:  You become very confused or drowsy.  You vomit blood or material that looks like coffee grounds.  Your stool is bloody or black or looks like tar. This information is not intended to replace advice given to you by your health care provider. Make sure you discuss any questions you have with your health care provider. Document Released: 12/30/2006 Document Revised: 03/27/2016 Document Reviewed: 06/07/2014  2017 Elsevier   Alcoholic Liver Disease Introduction Alcoholic liver disease happens when the liver does not work the way it should. The condition is caused by drinking too much alcohol for many years. Follow these instructions at home:  Do not drink alcohol.  Take medicines only as told by your doctor.  Take vitamins only as told by your doctor.  Follow any diet instructions that your doctor gave you. You may need to:  Eat foods that have thiamine. These include whole-wheat cereals, pork, and raw vegetables.  Eat foods that have folic acid. These include vegetables, fruits, meats, beans, nuts, and dairy foods.  Eat foods that are high in  carbohydrates. These include yogurt, beans, potatoes, and rice. Contact a doctor if:  You have a fever.  You are short of breath.  You have trouble breathing.  You have bright red blood in your poop (stool).  Your poop looks like tar.  You throw up (vomit) blood.  Your skin looks more yellow, pale, or dark.  You get headaches.  You have trouble thinking.  You have trouble balancing or walking. This information is not intended to replace advice given to you by your health care provider. Make sure you discuss any questions you have with your health care provider. Document Released: 08/17/2009 Document Revised: 03/27/2016 Document Reviewed: 09/21/2014  2017 Elsevier

## 2016-12-25 NOTE — Clinical Social Work Placement (Signed)
   CLINICAL SOCIAL WORK PLACEMENT  NOTE  Date:  12/25/2016  Patient Details  Name: Douglas Peterson MRN: [id number] Date of Birth: 01/17/1951  Clinical Social Work is seeking post-discharge placement for this patient at the [place] level of care (*CSW will initial, date and re-position this form in  chart as items are completed):  Yes   Patient/family provided with [facility] Work Department's list of facilities offering this level of care within the geographic area requested by the patient (or if unable, by the patient's family).  Yes   Patient/family informed of their freedom to choose among providers that offer the needed level of care, that participate in Medicare, Medicaid or managed care program needed by the patient, have an available bed and are willing to accept the patient.  Yes   Patient/family informed of [facility]'s ownership interest in [facility] and [facility], as well as of the fact that they are under no obligation to receive care at these facilities.  PASRR submitted to EDS on       PASRR number received on       Existing PASRR number confirmed on 12/19/16     FL2 transmitted to all facilities in geographic area requested by pt/family on 12/19/16     FL2 transmitted to all facilities within larger geographic area on       Patient informed that his/her managed care company has contracts with or will negotiate with certain facilities, including the following:        Yes   Patient/family informed of bed offers received.  Patient chooses bed at [facility]     Physician recommends and patient chooses bed at      Patient to be transferred to [facility] on 12/25/16.  Patient to be transferred to facility by [facility]     Patient family notified on 12/25/16 of transfer.  Name of family member notified:  [name] daughter, [name]     PHYSICIAN Please sign FL2     Additional Comment:     _______________________________________________ Darden Dates, LCSW 12/25/2016, 12:19 PM

## 2017-01-05 ENCOUNTER — Other Ambulatory Visit
Admission: RE | Admit: 2017-01-05 | Discharge: 2017-01-05 | Disposition: A | Discharge status: Home / Self Care | Payer: Medicare Other | Source: Ambulatory Visit | Attending: Family Medicine | Admitting: Family Medicine

## 2017-01-05 DIAGNOSIS — G9340 Encephalopathy, unspecified: Secondary | ICD-10-CM | POA: Diagnosis present

## 2017-01-05 LAB — AMMONIA: AMMONIA: 24 umol/L (ref 9–35)

## 2017-01-14 ENCOUNTER — Other Ambulatory Visit
Admission: RE | Admit: 2017-01-14 | Discharge: 2017-01-14 | Disposition: A | Discharge status: Home / Self Care | Payer: Medicare Other | Source: Ambulatory Visit | Attending: Family Medicine | Admitting: Family Medicine

## 2017-01-14 DIAGNOSIS — E7220 Disorder of urea cycle metabolism, unspecified: Secondary | ICD-10-CM | POA: Diagnosis present

## 2017-01-14 LAB — AMMONIA: AMMONIA: 29 umol/L (ref 9–35)

## 2017-01-20 ENCOUNTER — Other Ambulatory Visit
Admission: RE | Admit: 2017-01-20 | Discharge: 2017-01-20 | Disposition: A | Discharge status: Home / Self Care | Payer: Medicare Other | Source: Ambulatory Visit | Attending: Family Medicine | Admitting: Family Medicine

## 2017-01-20 DIAGNOSIS — R7879 Finding of abnormal level of heavy metals in blood: Secondary | ICD-10-CM | POA: Insufficient documentation

## 2017-01-20 LAB — AMMONIA: Ammonia: 50 umol/L — ABNORMAL HIGH (ref 9–35)

## 2017-02-27 DIAGNOSIS — K7682 Hepatic encephalopathy: Secondary | ICD-10-CM

## 2017-02-27 HISTORY — DX: Hepatic encephalopathy: K76.82

## 2017-09-16 ENCOUNTER — Inpatient Hospital Stay
Admission: EM | Admit: 2017-09-16 | Discharge: 2017-09-18 | DRG: 641 | Disposition: A | Discharge status: Home Health | Payer: Medicare Other | Attending: Internal Medicine | Admitting: Internal Medicine

## 2017-09-16 ENCOUNTER — Encounter: Payer: Self-pay | Admitting: Emergency Medicine

## 2017-09-16 DIAGNOSIS — E871 Hypo-osmolality and hyponatremia: Principal | ICD-10-CM | POA: Diagnosis present

## 2017-09-16 DIAGNOSIS — Z87891 Personal history of nicotine dependence: Secondary | ICD-10-CM | POA: Diagnosis not present

## 2017-09-16 DIAGNOSIS — Z789 Other specified health status: Secondary | ICD-10-CM | POA: Diagnosis present

## 2017-09-16 DIAGNOSIS — Z6820 Body mass index (BMI) 20.0-20.9, adult: Secondary | ICD-10-CM

## 2017-09-16 DIAGNOSIS — D6959 Other secondary thrombocytopenia: Secondary | ICD-10-CM | POA: Diagnosis present

## 2017-09-16 DIAGNOSIS — F329 Major depressive disorder, single episode, unspecified: Secondary | ICD-10-CM | POA: Diagnosis not present

## 2017-09-16 DIAGNOSIS — Z8249 Family history of ischemic heart disease and other diseases of the circulatory system: Secondary | ICD-10-CM

## 2017-09-16 DIAGNOSIS — M24541 Contracture, right hand: Secondary | ICD-10-CM | POA: Diagnosis not present

## 2017-09-16 DIAGNOSIS — Z79899 Other long term (current) drug therapy: Secondary | ICD-10-CM | POA: Diagnosis not present

## 2017-09-16 DIAGNOSIS — I10 Essential (primary) hypertension: Secondary | ICD-10-CM | POA: Diagnosis not present

## 2017-09-16 DIAGNOSIS — E46 Unspecified protein-calorie malnutrition: Secondary | ICD-10-CM | POA: Diagnosis present

## 2017-09-16 DIAGNOSIS — K7460 Unspecified cirrhosis of liver: Secondary | ICD-10-CM | POA: Diagnosis present

## 2017-09-16 DIAGNOSIS — Z7982 Long term (current) use of aspirin: Secondary | ICD-10-CM

## 2017-09-16 DIAGNOSIS — F10230 Alcohol dependence with withdrawal, uncomplicated: Secondary | ICD-10-CM | POA: Diagnosis not present

## 2017-09-16 DIAGNOSIS — Z7952 Long term (current) use of systemic steroids: Secondary | ICD-10-CM | POA: Diagnosis not present

## 2017-09-16 DIAGNOSIS — F1020 Alcohol dependence, uncomplicated: Secondary | ICD-10-CM

## 2017-09-16 DIAGNOSIS — Z801 Family history of malignant neoplasm of trachea, bronchus and lung: Secondary | ICD-10-CM | POA: Diagnosis not present

## 2017-09-16 DIAGNOSIS — Z7289 Other problems related to lifestyle: Secondary | ICD-10-CM | POA: Diagnosis present

## 2017-09-16 DIAGNOSIS — F1090 Alcohol use, unspecified, uncomplicated: Secondary | ICD-10-CM | POA: Diagnosis present

## 2017-09-16 LAB — CBC
HCT: 36.7 % — ABNORMAL LOW (ref 40.0–52.0)
Hemoglobin: 12.7 g/dL — ABNORMAL LOW (ref 13.0–18.0)
MCH: 31.8 pg (ref 26.0–34.0)
MCHC: 34.7 g/dL (ref 32.0–36.0)
MCV: 91.8 fL (ref 80.0–100.0)
PLATELETS: 88 10*3/uL — AB (ref 150–440)
RBC: 4 MIL/uL — AB (ref 4.40–5.90)
RDW: 12.2 % (ref 11.5–14.5)
WBC: 4.2 10*3/uL (ref 3.8–10.6)

## 2017-09-16 LAB — URINALYSIS, COMPLETE (UACMP) WITH MICROSCOPIC
Bacteria, UA: NONE SEEN
Bilirubin Urine: NEGATIVE
Glucose, UA: NEGATIVE mg/dL
Ketones, ur: NEGATIVE mg/dL
LEUKOCYTES UA: NEGATIVE
Nitrite: NEGATIVE
PH: 6 (ref 5.0–8.0)
Protein, ur: NEGATIVE mg/dL
SPECIFIC GRAVITY, URINE: 1.01 (ref 1.005–1.030)
SQUAMOUS EPITHELIAL / LPF: NONE SEEN

## 2017-09-16 LAB — BASIC METABOLIC PANEL
Anion gap: 12 (ref 5–15)
BUN: 5 mg/dL — ABNORMAL LOW (ref 6–20)
CALCIUM: 8.6 mg/dL — AB (ref 8.9–10.3)
CO2: 24 mmol/L (ref 22–32)
CREATININE: 0.34 mg/dL — AB (ref 0.61–1.24)
Chloride: 87 mmol/L — ABNORMAL LOW (ref 101–111)
GFR calc non Af Amer: >60 mL/min (ref >60)
Glucose, Bld: 93 mg/dL (ref 65–99)
Potassium: 4.1 mmol/L (ref 3.5–5.1)
SODIUM: 123 mmol/L — AB (ref 135–145)

## 2017-09-16 LAB — URINE DRUG SCREEN, QUALITATIVE (ARMC ONLY)
Amphetamines, Ur Screen: NOT DETECTED
Barbiturates, Ur Screen: NOT DETECTED
Benzodiazepine, Ur Scrn: NOT DETECTED
COCAINE METABOLITE, UR ~~LOC~~: NOT DETECTED
Cannabinoid 50 Ng, Ur ~~LOC~~: NOT DETECTED
MDMA (ECSTASY) UR SCREEN: NOT DETECTED
METHADONE SCREEN, URINE: NOT DETECTED
Opiate, Ur Screen: NOT DETECTED
Phencyclidine (PCP) Ur S: NOT DETECTED
TRICYCLIC, UR SCREEN: NOT DETECTED

## 2017-09-16 LAB — ETHANOL: ALCOHOL ETHYL (B): 164 mg/dL — AB (ref <10)

## 2017-09-16 MED ORDER — FOLIC ACID 1 MG PO TABS
1.0000 mg | ORAL_TABLET | Freq: Every day | ORAL | Status: DC
  Administered 2017-09-17 – 2017-09-18 (×2): 1 mg via ORAL
  Filled 2017-09-16 (×2): qty 1

## 2017-09-16 MED ORDER — ONDANSETRON HCL 4 MG PO TABS
4.0000 mg | ORAL_TABLET | Freq: Four times a day (QID) | ORAL | Status: DC | PRN
  Administered 2017-09-17: 17:00:00 4 mg via ORAL
  Filled 2017-09-16: qty 1

## 2017-09-16 MED ORDER — ONDANSETRON HCL 4 MG/2ML IJ SOLN: 4.0000 mg | Freq: Four times a day (QID) | INTRAMUSCULAR | Status: DC | PRN

## 2017-09-16 MED ORDER — THIAMINE HCL 100 MG/ML IJ SOLN: 100.0000 mg | Freq: Every day | INTRAMUSCULAR | Status: DC

## 2017-09-16 MED ORDER — VITAMIN B-1 100 MG PO TABS: 100.0000 mg | ORAL_TABLET | Freq: Every day | ORAL | Status: DC

## 2017-09-16 MED ORDER — LORAZEPAM 2 MG/ML IJ SOLN: 0.0000 mg | Freq: Two times a day (BID) | INTRAMUSCULAR | Status: DC

## 2017-09-16 MED ORDER — PAROXETINE HCL 10 MG PO TABS
10.0000 mg | ORAL_TABLET | Freq: Every day | ORAL | Status: DC
  Administered 2017-09-17 – 2017-09-18 (×2): 10 mg via ORAL
  Filled 2017-09-16 (×2): qty 1

## 2017-09-16 MED ORDER — SODIUM CHLORIDE 0.9 % IV SOLN
INTRAVENOUS | Status: AC
  Administered 2017-09-16: via INTRAVENOUS

## 2017-09-16 MED ORDER — THIAMINE HCL 100 MG/ML IJ SOLN
100.0000 mg | Freq: Every day | INTRAMUSCULAR | Status: DC
  Administered 2017-09-16: 100 mg via INTRAVENOUS
  Filled 2017-09-16: qty 2

## 2017-09-16 MED ORDER — ASPIRIN EC 81 MG PO TBEC
81.0000 mg | DELAYED_RELEASE_TABLET | Freq: Every day | ORAL | Status: DC
  Administered 2017-09-17 – 2017-09-18 (×2): 81 mg via ORAL
  Filled 2017-09-16 (×2): qty 1

## 2017-09-16 MED ORDER — LORAZEPAM 2 MG/ML IJ SOLN: 0.0000 mg | Freq: Four times a day (QID) | INTRAMUSCULAR | Status: DC

## 2017-09-16 MED ORDER — VITAMIN B-1 100 MG PO TABS
100.0000 mg | ORAL_TABLET | Freq: Every day | ORAL | Status: DC
  Administered 2017-09-17 – 2017-09-18 (×2): 100 mg via ORAL
  Filled 2017-09-16 (×2): qty 1

## 2017-09-16 MED ORDER — ADULT MULTIVITAMIN W/MINERALS CH
1.0000 | ORAL_TABLET | Freq: Every day | ORAL | Status: DC
  Administered 2017-09-17 – 2017-09-18 (×2): 1 via ORAL
  Filled 2017-09-16 (×2): qty 1

## 2017-09-16 MED ORDER — ONDANSETRON HCL 4 MG/2ML IJ SOLN
4.0000 mg | Freq: Once | INTRAMUSCULAR | Status: AC
  Administered 2017-09-16: 4 mg via INTRAVENOUS

## 2017-09-16 MED ORDER — SODIUM CHLORIDE 0.9 % IV BOLUS (SEPSIS)
500.0000 mL | Freq: Once | INTRAVENOUS | Status: AC
  Administered 2017-09-16: 500 mL via INTRAVENOUS

## 2017-09-16 MED ORDER — ATENOLOL 25 MG PO TABS
25.0000 mg | ORAL_TABLET | Freq: Two times a day (BID) | ORAL | Status: DC
  Administered 2017-09-17 – 2017-09-18 (×3): 25 mg via ORAL
  Filled 2017-09-16 (×4): qty 1

## 2017-09-16 MED ORDER — LORAZEPAM 2 MG/ML IJ SOLN: 1.0000 mg | Freq: Four times a day (QID) | INTRAMUSCULAR | Status: DC | PRN

## 2017-09-16 MED ORDER — LORAZEPAM 2 MG PO TABS: 0.0000 mg | ORAL_TABLET | Freq: Two times a day (BID) | ORAL | Status: DC

## 2017-09-16 MED ORDER — LORAZEPAM 2 MG/ML IJ SOLN
0.0000 mg | Freq: Four times a day (QID) | INTRAMUSCULAR | Status: DC
  Administered 2017-09-17 (×3): 1 mg via INTRAVENOUS
  Filled 2017-09-16 (×3): qty 1

## 2017-09-16 MED ORDER — LORAZEPAM 2 MG PO TABS: 0.0000 mg | ORAL_TABLET | Freq: Four times a day (QID) | ORAL | Status: DC

## 2017-09-16 MED ORDER — AMLODIPINE BESYLATE 5 MG PO TABS
2.5000 mg | ORAL_TABLET | Freq: Every day | ORAL | Status: DC
  Administered 2017-09-17 – 2017-09-18 (×2): 2.5 mg via ORAL
  Filled 2017-09-16 (×2): qty 1

## 2017-09-16 MED ORDER — LORAZEPAM 1 MG PO TABS: 1.0000 mg | ORAL_TABLET | Freq: Four times a day (QID) | ORAL | Status: DC | PRN

## 2017-09-16 MED ORDER — ENOXAPARIN SODIUM 40 MG/0.4ML ~~LOC~~ SOLN
40.0000 mg | SUBCUTANEOUS | Status: DC
  Administered 2017-09-17: 40 mg via SUBCUTANEOUS
  Filled 2017-09-16: qty 0.4

## 2017-09-16 MED ORDER — ONDANSETRON HCL 4 MG/2ML IJ SOLN
INTRAMUSCULAR | Status: AC
  Filled 2017-09-16: qty 2

## 2017-09-16 MED ORDER — LISINOPRIL 20 MG PO TABS
20.0000 mg | ORAL_TABLET | Freq: Two times a day (BID) | ORAL | Status: DC
  Administered 2017-09-17 – 2017-09-18 (×2): 20 mg via ORAL
  Filled 2017-09-16 (×3): qty 1

## 2017-09-16 MED ORDER — HYDROXYZINE HCL 25 MG PO TABS
25.0000 mg | ORAL_TABLET | Freq: Every day | ORAL | Status: DC | PRN
  Filled 2017-09-16: qty 1
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
## 2017-09-16 NOTE — ED Notes (Signed)
Nurse unable to take report
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
## 2017-09-16 NOTE — ED Notes (Signed)
Report off to [employer]

## 2017-09-16 NOTE — ED Notes (Signed)
Pt reports feeling weak and nauseated for 2 weeks.  Pt drinks alcohol each day and has had 2 beers today.  No abd pain.  No back pain.  Pt alert.  Speech clear.  Family with pt.  Sinus on monitor.

## 2017-09-17 ENCOUNTER — Other Ambulatory Visit: Payer: Self-pay

## 2017-09-17 DIAGNOSIS — M24541 Contracture, right hand: Secondary | ICD-10-CM | POA: Diagnosis not present

## 2017-09-17 DIAGNOSIS — Z8249 Family history of ischemic heart disease and other diseases of the circulatory system: Secondary | ICD-10-CM | POA: Diagnosis not present

## 2017-09-17 DIAGNOSIS — E871 Hypo-osmolality and hyponatremia: Secondary | ICD-10-CM | POA: Diagnosis present

## 2017-09-17 DIAGNOSIS — F10230 Alcohol dependence with withdrawal, uncomplicated: Secondary | ICD-10-CM | POA: Diagnosis not present

## 2017-09-17 DIAGNOSIS — Z7952 Long term (current) use of systemic steroids: Secondary | ICD-10-CM | POA: Diagnosis not present

## 2017-09-17 DIAGNOSIS — Z801 Family history of malignant neoplasm of trachea, bronchus and lung: Secondary | ICD-10-CM | POA: Diagnosis not present

## 2017-09-17 DIAGNOSIS — F329 Major depressive disorder, single episode, unspecified: Secondary | ICD-10-CM | POA: Diagnosis not present

## 2017-09-17 DIAGNOSIS — E46 Unspecified protein-calorie malnutrition: Secondary | ICD-10-CM | POA: Diagnosis not present

## 2017-09-17 DIAGNOSIS — Z7982 Long term (current) use of aspirin: Secondary | ICD-10-CM | POA: Diagnosis not present

## 2017-09-17 DIAGNOSIS — I10 Essential (primary) hypertension: Secondary | ICD-10-CM | POA: Diagnosis not present

## 2017-09-17 DIAGNOSIS — D6959 Other secondary thrombocytopenia: Secondary | ICD-10-CM | POA: Diagnosis not present

## 2017-09-17 DIAGNOSIS — Z6820 Body mass index (BMI) 20.0-20.9, adult: Secondary | ICD-10-CM | POA: Diagnosis not present

## 2017-09-17 DIAGNOSIS — K7460 Unspecified cirrhosis of liver: Secondary | ICD-10-CM | POA: Diagnosis not present

## 2017-09-17 DIAGNOSIS — Z87891 Personal history of nicotine dependence: Secondary | ICD-10-CM | POA: Diagnosis not present

## 2017-09-17 DIAGNOSIS — Z79899 Other long term (current) drug therapy: Secondary | ICD-10-CM | POA: Diagnosis not present

## 2017-09-17 LAB — CBC
HEMATOCRIT: 33.7 % — AB (ref 40.0–52.0)
HEMOGLOBIN: 11.8 g/dL — AB (ref 13.0–18.0)
MCH: 32.2 pg (ref 26.0–34.0)
MCHC: 35.1 g/dL (ref 32.0–36.0)
MCV: 91.8 fL (ref 80.0–100.0)
PLATELETS: 67 10*3/uL — AB (ref 150–440)
RBC: 3.67 MIL/uL — AB (ref 4.40–5.90)
RDW: 12.2 % (ref 11.5–14.5)
WBC: 4 10*3/uL (ref 3.8–10.6)

## 2017-09-17 LAB — COMPREHENSIVE METABOLIC PANEL
ALT: 36 U/L (ref 17–63)
AST: 63 U/L — ABNORMAL HIGH (ref 15–41)
Albumin: 3.7 g/dL (ref 3.5–5.0)
Alkaline Phosphatase: 106 U/L (ref 38–126)
Anion gap: 11 (ref 5–15)
BUN: 7 mg/dL (ref 6–20)
CHLORIDE: 92 mmol/L — AB (ref 101–111)
CO2: 23 mmol/L (ref 22–32)
CREATININE: 0.44 mg/dL — AB (ref 0.61–1.24)
Calcium: 8.3 mg/dL — ABNORMAL LOW (ref 8.9–10.3)
GFR calc non Af Amer: >60 mL/min (ref >60)
Glucose, Bld: 63 mg/dL — ABNORMAL LOW (ref 65–99)
POTASSIUM: 4.3 mmol/L (ref 3.5–5.1)
SODIUM: 126 mmol/L — AB (ref 135–145)
Total Bilirubin: 1.5 mg/dL — ABNORMAL HIGH (ref 0.3–1.2)
Total Protein: 6.2 g/dL — ABNORMAL LOW (ref 6.5–8.1)

## 2017-09-17 LAB — SODIUM: SODIUM: 125 mmol/L — AB (ref 135–145)

## 2017-09-17 MED ORDER — LACTULOSE 10 GM/15ML PO SOLN
30.0000 g | Freq: Every day | ORAL | Status: DC
  Administered 2017-09-17 – 2017-09-18 (×2): 30 g via ORAL
  Filled 2017-09-17 (×2): qty 60

## 2017-09-17 MED ORDER — SODIUM CHLORIDE 0.9 % IV SOLN
INTRAVENOUS | Status: AC
  Administered 2017-09-17: 18:00:00 via INTRAVENOUS

## 2017-09-17 MED ORDER — SODIUM CHLORIDE 0.9 % IV SOLN
INTRAVENOUS | Status: DC
  Administered 2017-09-17: 12:00:00 via INTRAVENOUS

## 2017-09-17 MED ORDER — BACLOFEN 10 MG PO TABS
10.0000 mg | ORAL_TABLET | Freq: Three times a day (TID) | ORAL | Status: DC
  Administered 2017-09-17 – 2017-09-18 (×3): 10 mg via ORAL
  Filled 2017-09-17 (×3): qty 1

## 2017-09-17 NOTE — Progress Notes (Deleted)
Received Md order to discharge patient to home, reviewed home meds prescriptions and discharge instructions with patient and patient verbalized understanding  Patient did not want a wheelchair and wanted to walk out with boyfriend

## 2017-09-17 NOTE — Care Management Obs Status (Signed)
[place] NOTIFICATION   Patient Details  Name: Douglas Peterson MRN: [id number] Date of Birth: December 10, 1950   Medicare Observation Status Notification Given:  Yes    Shelbie Ammons, RN 09/17/2017, 8:50 AM

## 2017-09-17 NOTE — Progress Notes (Signed)
[facility] [place] NAME: Douglas Peterson    MR#:  [id number]  DATE OF BIRTH:  [birth year]  SUBJECTIVE:   Patient drinks ETOh poor nutrition  REVIEW OF SYSTEMS:    Review of Systems  Constitutional: Negative for fever, chills positive weight loss and generalized weakness HENT: Negative for ear pain, nosebleeds, congestion, facial swelling, rhinorrhea, neck pain, neck stiffness and ear discharge.   Respiratory: Negative for cough, shortness of breath, wheezing  Cardiovascular: Negative for chest pain, palpitations and leg swelling.  Gastrointestinal: Negative for heartburn, abdominal pain, vomiting, diarrhea or consitpation Genitourinary: Negative for dysuria, urgency, frequency, hematuria Musculoskeletal: Negative for back pain or joint pain Neurological: Negative for dizziness, seizures, syncope, focal weakness,  numbness and headaches.  Hematological: Does not bruise/bleed easily.  Psychiatric/Behavioral: Negative for hallucinations, confusion, dysphoric mood    Tolerating Diet: yes      DRUG ALLERGIES:  No Known Allergies  VITALS:  Blood pressure (!) 127/59, pulse 67, temperature 98.2 F (36.8 C), temperature source Oral, resp. rate 18, height 5' 4"  (1.626 m), weight 53.8 kg (118 lb 8 oz), SpO2 98 %.  PHYSICAL EXAMINATION:  Constitutional: Appears thin and malnourished  HENT: Normocephalic. [name] Oropharynx is clear and moist.  Eyes: Conjunctivae and EOM are normal. PERRLA, no scleral icterus.  Neck: Normal ROM. Neck supple. No JVD. No tracheal deviation. CVS: RRR, S1/S2 +, no murmurs, no gallops, no carotid bruit.  Pulmonary: Effort and breath sounds normal, no stridor, rhonchi, wheezes, rales.  Abdominal: Soft. BS +,  no distension, tenderness, rebound or guarding.  Musculoskeletal: Normal range of motion. No edema and no tenderness.  Neuro: Alert. CN 2-12 grossly intact. No focal deficits. Skin: Skin is warm and dry. No  rash noted. Psychiatric: Normal mood and affect.      LABORATORY PANEL:   CBC Recent Labs  Lab 09/17/17 0439  WBC 4.0  HGB 11.8*  HCT 33.7*  PLT 67*   ------------------------------------------------------------------------------------------------------------------  Chemistries  Recent Labs  Lab 09/17/17 0439  NA 126*  K 4.3  CL 92*  CO2 23  GLUCOSE 63*  BUN 7  CREATININE 0.44*  CALCIUM 8.3*  AST 63*  ALT 36  ALKPHOS 106  BILITOT 1.5*   ------------------------------------------------------------------------------------------------------------------  Cardiac Enzymes No results for input(s): TROPONINI in the last 168 hours. ------------------------------------------------------------------------------------------------------------------  RADIOLOGY:  No results found.   ASSESSMENT AND PLAN:   66 year old male with history of EtOH abuse who presents with weakness and found to have hyponatremia.  1. Hyponatremia due to EtOH use and poor nutritional status Continue IV fluids and monitoring sodium  2. EtOH abuse: Continue CIWA protocol  3. 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 Follow PLTA  4. Protein calorie malnutrition: Dietary consult  5. Essential hypertension: Continue Norvasc and atenolol and lisinopril  6. Depression: Continue Paxil      Management plans discussed with the patient and he is in agreement.  CODE STATUS: full  TOTAL TIME TAKING CARE OF THIS PATIENT: 30 minutes.     POSSIBLE D/C 1-2 days, DEPENDING ON CLINICAL CONDITION.   ,  M.D on 09/17/2017 at 11:50 AM  Between 7am to 6pm - Pager - [phone] After 6pm go to [email or web address] - password [email or web address] [facility]  Office  [phone]  CC: Primary care physician; [name], MD  Note: This dictation was prepared with Dragon dictation along with smaller phrase technology.  Any transcriptional errors  that result from this process are unintentional.

## 2017-09-17 NOTE — Care Management Note (Signed)
Case Management Note  Patient Details  Name: Douglas Peterson MRN: [id number] Date of Birth: 09/22/1951  Subjective/Objective:   Admitted to [facility] with the diagnosis of hyponatremia under observation status.     Lives alone. Daughter is Douglas Peterson [phone]). Last seen Dr. Raechel Ache 3-4 months ago. Next appointment is in January 2019. Prescriptions are filled at [facility] on [address] about a year ago. Doesn't remember name of agency. [employer] in February 2018. No home oxygen. Rolling walker, BP equipment, and cane in the home. No home oxygen. Takes care of all basic and instrumental activities of daily living himself, drives. Questionable fall prior to this admission. Good appetite. Family will transport        Action/Plan: No discharge needs identified at this time   Expected Discharge Date:  09/17/17               Expected Discharge Plan:     In-House Referral:     Discharge planning Services     Post Acute Care Choice:    Choice offered to:     DME Arranged:    DME Agency:     HH Arranged:    [place] Agency:     Status of Service:     If discussed at [employer] [facility] [employer], dates discussed:    Additional Comments:  Shelbie Ammons, RN MSN CCM Care management [phone] 09/17/2017, 8:50 AM

## 2017-09-17 NOTE — Progress Notes (Signed)
Medications administered by student RN [phone] with supervision of Clinical Instructor Lynann Bologna MSN, RN-BC or patient's assigned RN.

## 2017-09-18 DIAGNOSIS — E871 Hypo-osmolality and hyponatremia: Secondary | ICD-10-CM | POA: Diagnosis not present

## 2017-09-18 LAB — BASIC METABOLIC PANEL
Anion gap: 9 (ref 5–15)
BUN: 10 mg/dL (ref 6–20)
CHLORIDE: 96 mmol/L — AB (ref 101–111)
CO2: 25 mmol/L (ref 22–32)
CREATININE: 0.53 mg/dL — AB (ref 0.61–1.24)
Calcium: 8.6 mg/dL — ABNORMAL LOW (ref 8.9–10.3)
GFR calc non Af Amer: >60 mL/min (ref >60)
Glucose, Bld: 93 mg/dL (ref 65–99)
POTASSIUM: 3.8 mmol/L (ref 3.5–5.1)
SODIUM: 130 mmol/L — AB (ref 135–145)

## 2017-09-18 MED ORDER — AMLODIPINE BESYLATE 5 MG PO TABS: 5.0000 mg | ORAL_TABLET | Freq: Every day | ORAL | 0 refills | Status: DC

## 2017-09-18 MED ORDER — BACLOFEN 10 MG PO TABS: 10.0000 mg | ORAL_TABLET | Freq: Three times a day (TID) | ORAL | 0 refills | Status: DC

## 2017-09-18 NOTE — Progress Notes (Signed)
Pt being discharged home, discharge instructions and prescriptions reviewed with pt, states understanding, pt with no complaints, no distress or discomfort noted
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
## 2017-09-22 ENCOUNTER — Emergency Department: Payer: Medicare Other

## 2017-09-22 ENCOUNTER — Emergency Department
Admission: EM | Admit: 2017-09-22 | Discharge: 2017-09-22 | Disposition: A | Discharge status: Home / Self Care | Payer: Medicare Other | Attending: Emergency Medicine | Admitting: Emergency Medicine

## 2017-09-22 ENCOUNTER — Encounter: Payer: Self-pay | Admitting: Emergency Medicine

## 2017-09-22 DIAGNOSIS — I10 Essential (primary) hypertension: Secondary | ICD-10-CM | POA: Diagnosis not present

## 2017-09-22 DIAGNOSIS — Z7982 Long term (current) use of aspirin: Secondary | ICD-10-CM | POA: Insufficient documentation

## 2017-09-22 DIAGNOSIS — Z87891 Personal history of nicotine dependence: Secondary | ICD-10-CM | POA: Insufficient documentation

## 2017-09-22 DIAGNOSIS — R4182 Altered mental status, unspecified: Secondary | ICD-10-CM | POA: Insufficient documentation

## 2017-09-22 DIAGNOSIS — Z79899 Other long term (current) drug therapy: Secondary | ICD-10-CM | POA: Diagnosis not present

## 2017-09-22 DIAGNOSIS — E871 Hypo-osmolality and hyponatremia: Secondary | ICD-10-CM | POA: Diagnosis not present

## 2017-09-22 DIAGNOSIS — R200 Anesthesia of skin: Secondary | ICD-10-CM | POA: Diagnosis not present

## 2017-09-22 DIAGNOSIS — F1010 Alcohol abuse, uncomplicated: Secondary | ICD-10-CM

## 2017-09-22 DIAGNOSIS — R531 Weakness: Secondary | ICD-10-CM | POA: Diagnosis not present

## 2017-09-22 DIAGNOSIS — F1020 Alcohol dependence, uncomplicated: Secondary | ICD-10-CM | POA: Diagnosis not present

## 2017-09-22 LAB — COMPREHENSIVE METABOLIC PANEL
ALK PHOS: 85 U/L (ref 38–126)
ALT: 38 U/L (ref 17–63)
AST: 50 U/L — AB (ref 15–41)
Albumin: 4 g/dL (ref 3.5–5.0)
Anion gap: 10 (ref 5–15)
BILIRUBIN TOTAL: 0.5 mg/dL (ref 0.3–1.2)
BUN: 15 mg/dL (ref 6–20)
CALCIUM: 8.5 mg/dL — AB (ref 8.9–10.3)
CO2: 26 mmol/L (ref 22–32)
CREATININE: 0.53 mg/dL — AB (ref 0.61–1.24)
Chloride: 99 mmol/L — ABNORMAL LOW (ref 101–111)
GFR calc Af Amer: >60 mL/min (ref >60)
GLUCOSE: 144 mg/dL — AB (ref 65–99)
POTASSIUM: 2.9 mmol/L — AB (ref 3.5–5.1)
Sodium: 135 mmol/L (ref 135–145)
TOTAL PROTEIN: 6.8 g/dL (ref 6.5–8.1)

## 2017-09-22 LAB — CBC WITH DIFFERENTIAL/PLATELET
Basophils Absolute: 0 10*3/uL (ref 0–0.1)
Basophils Relative: 1 %
Eosinophils Absolute: 0.1 10*3/uL (ref 0–0.7)
Eosinophils Relative: 1 %
HEMATOCRIT: 37.7 % — AB (ref 40.0–52.0)
HEMOGLOBIN: 13 g/dL (ref 13.0–18.0)
LYMPHS ABS: 2.3 10*3/uL (ref 1.0–3.6)
LYMPHS PCT: 54 %
MCH: 32.6 pg (ref 26.0–34.0)
MCHC: 34.3 g/dL (ref 32.0–36.0)
MCV: 95.1 fL (ref 80.0–100.0)
MONOS PCT: 10 %
Monocytes Absolute: 0.4 10*3/uL (ref 0.2–1.0)
NEUTROS ABS: 1.5 10*3/uL (ref 1.4–6.5)
NEUTROS PCT: 34 %
Platelets: 133 10*3/uL — ABNORMAL LOW (ref 150–440)
RBC: 3.97 MIL/uL — AB (ref 4.40–5.90)
RDW: 13.2 % (ref 11.5–14.5)
WBC: 4.3 10*3/uL (ref 3.8–10.6)

## 2017-09-22 LAB — ETHANOL: ALCOHOL ETHYL (B): 245 mg/dL — AB (ref <10)

## 2017-09-22 LAB — ACETAMINOPHEN LEVEL: Acetaminophen (Tylenol), Serum: <10 ug/mL — ABNORMAL LOW (ref 10–30)

## 2017-09-22 LAB — URINALYSIS, COMPLETE (UACMP) WITH MICROSCOPIC
BACTERIA UA: NONE SEEN
Bilirubin Urine: NEGATIVE
GLUCOSE, UA: NEGATIVE mg/dL
Hgb urine dipstick: NEGATIVE
KETONES UR: NEGATIVE mg/dL
Leukocytes, UA: NEGATIVE
Nitrite: NEGATIVE
PROTEIN: NEGATIVE mg/dL
RBC / HPF: NONE SEEN RBC/hpf (ref 0–5)
Specific Gravity, Urine: 1.02 (ref 1.005–1.030)
Squamous Epithelial / LPF: NONE SEEN
pH: 5 (ref 5.0–8.0)

## 2017-09-22 LAB — BRAIN NATRIURETIC PEPTIDE: B Natriuretic Peptide: 8 pg/mL (ref 0.0–100.0)

## 2017-09-22 LAB — TROPONIN I

## 2017-09-22 LAB — LACTIC ACID, PLASMA
Lactic Acid, Venous: 2 mmol/L (ref 0.5–1.9)
Lactic Acid, Venous: 1.3 mmol/L (ref 0.5–1.9)

## 2017-09-22 MED ORDER — LORAZEPAM 2 MG PO TABS
2.0000 mg | ORAL_TABLET | Freq: Once | ORAL | Status: AC
  Administered 2017-09-22: 2 mg via ORAL
  Filled 2017-09-22: qty 1

## 2017-09-22 MED ORDER — POTASSIUM CHLORIDE CRYS ER 20 MEQ PO TBCR
40.0000 meq | EXTENDED_RELEASE_TABLET | Freq: Once | ORAL | Status: DC
  Filled 2017-09-22: qty 2

## 2017-09-22 MED ORDER — POTASSIUM CHLORIDE 20 MEQ PO PACK
40.0000 meq | PACK | Freq: Once | ORAL | Status: AC
  Administered 2017-09-22: 40 meq via ORAL
  Filled 2017-09-22: qty 2

## 2017-09-22 MED ORDER — THIAMINE HCL 100 MG/ML IJ SOLN
Freq: Once | INTRAVENOUS | Status: AC
  Administered 2017-09-22: 12:00:00 via INTRAVENOUS
  Filled 2017-09-22: qty 1000

## 2017-09-22 NOTE — ED Notes (Signed)
Spoke to pt's sister who states she is unable to take pt home today if he is discharged but that his daughter will come from [place] to pick him up. Also spoke to pt's daughter over the phone who agrees with plan. Explained to both family members that pt was initially agreeable to going to detox/rehab when he first arrived in ED. However, as he has become more sober he is refusing to go and states he will "call somebody from rehab later tonight." TTS and nursing have spoken to pt and family about his wishes.

## 2017-09-22 NOTE — ED Triage Notes (Signed)
Patient presents to the ED via EMS for generalized weakness and alcoholism.  Patient was admitted to the hospital for low sodium on Wednesday and released from the hospital on Friday.  Patient states since Friday he has been drinking liquor.  Patient states he would like to stop drinking.  [name] concerned his sodium may be low again.

## 2017-09-22 NOTE — ED Notes (Signed)
Pt states he is agreeable to go to [facility] for detox. Sister willing to transport. TTS states pt needs to call facility himself and set up admission [phone]. Pt given portable phone to talk to TSH intake. Sister updated.

## 2017-09-22 NOTE — BH Assessment (Signed)
Writer discussed treatment options with the patient and he declined assistance from writer help him seek inpatient treatment options to address his alcohol use.  Writer spoke with the patient and his sister Douglas Peterson)  and reviewed with them treatment options. Patient stated he wanted to go home and he was going to call and follow up. Patient sister voiced her frustration with the patient and asked him to allow writer to assist him with connecting him with treatment. Patient continued to declined assistance and voiced his desire to return home due to not being medically admitted at [facility].  Discussed patient with ER MD (Dr. Ivonne Andrew) and patient is able to discharge home when medically cleared. Patient was giving referral information and instructions on how to follow up with Inpatient and Outpatient Treatment [facility]).  Patient denies SI/HI and AV/H.  _________ [facility] [place],  [place], [place] [id number] [phone]

## 2017-09-22 NOTE — Discharge Instructions (Signed)
patient is medically cleared for alcohol detox and rehabilitation. He is to go straight there.
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
## 2017-09-25 ENCOUNTER — Encounter: Payer: Self-pay | Admitting: Emergency Medicine

## 2017-09-25 ENCOUNTER — Emergency Department
Admission: EM | Admit: 2017-09-25 | Discharge: 2017-09-25 | Disposition: A | Discharge status: Home / Self Care | Payer: Medicare Other | Attending: Emergency Medicine | Admitting: Emergency Medicine

## 2017-09-25 ENCOUNTER — Other Ambulatory Visit: Payer: Self-pay

## 2017-09-25 DIAGNOSIS — F1010 Alcohol abuse, uncomplicated: Secondary | ICD-10-CM | POA: Insufficient documentation

## 2017-09-25 DIAGNOSIS — Z87891 Personal history of nicotine dependence: Secondary | ICD-10-CM | POA: Diagnosis not present

## 2017-09-25 DIAGNOSIS — I10 Essential (primary) hypertension: Secondary | ICD-10-CM | POA: Diagnosis not present

## 2017-09-25 DIAGNOSIS — R531 Weakness: Secondary | ICD-10-CM | POA: Insufficient documentation

## 2017-09-25 DIAGNOSIS — E871 Hypo-osmolality and hyponatremia: Secondary | ICD-10-CM | POA: Diagnosis not present

## 2017-09-25 LAB — COMPREHENSIVE METABOLIC PANEL
ALBUMIN: 4.2 g/dL (ref 3.5–5.0)
ALT: 30 U/L (ref 17–63)
ANION GAP: 12 (ref 5–15)
AST: 43 U/L — ABNORMAL HIGH (ref 15–41)
Alkaline Phosphatase: 89 U/L (ref 38–126)
BUN: 11 mg/dL (ref 6–20)
CHLORIDE: 91 mmol/L — AB (ref 101–111)
CO2: 25 mmol/L (ref 22–32)
Calcium: 9 mg/dL (ref 8.9–10.3)
Creatinine, Ser: 0.48 mg/dL — ABNORMAL LOW (ref 0.61–1.24)
GFR calc non Af Amer: >60 mL/min (ref >60)
Glucose, Bld: 93 mg/dL (ref 65–99)
POTASSIUM: 4.2 mmol/L (ref 3.5–5.1)
SODIUM: 128 mmol/L — AB (ref 135–145)
Total Bilirubin: 1 mg/dL (ref 0.3–1.2)
Total Protein: 7.1 g/dL (ref 6.5–8.1)

## 2017-09-25 LAB — CBC WITH DIFFERENTIAL/PLATELET
BASOS PCT: 1 %
Basophils Absolute: 0 10*3/uL (ref 0–0.1)
EOS ABS: 0 10*3/uL (ref 0–0.7)
EOS PCT: 1 %
HCT: 36.2 % — ABNORMAL LOW (ref 40.0–52.0)
Hemoglobin: 12.5 g/dL — ABNORMAL LOW (ref 13.0–18.0)
LYMPHS ABS: 1.4 10*3/uL (ref 1.0–3.6)
Lymphocytes Relative: 35 %
MCH: 32.8 pg (ref 26.0–34.0)
MCHC: 34.7 g/dL (ref 32.0–36.0)
MCV: 94.5 fL (ref 80.0–100.0)
MONOS PCT: 15 %
Monocytes Absolute: 0.6 10*3/uL (ref 0.2–1.0)
Neutro Abs: 1.9 10*3/uL (ref 1.4–6.5)
Neutrophils Relative %: 48 %
PLATELETS: 110 10*3/uL — AB (ref 150–440)
RBC: 3.83 MIL/uL — ABNORMAL LOW (ref 4.40–5.90)
RDW: 13.6 % (ref 11.5–14.5)
WBC: 3.9 10*3/uL (ref 3.8–10.6)

## 2017-09-25 LAB — URINALYSIS, COMPLETE (UACMP) WITH MICROSCOPIC
BILIRUBIN URINE: NEGATIVE
Bacteria, UA: NONE SEEN
Glucose, UA: NEGATIVE mg/dL
KETONES UR: NEGATIVE mg/dL
LEUKOCYTES UA: NEGATIVE
NITRITE: NEGATIVE
PROTEIN: NEGATIVE mg/dL
SPECIFIC GRAVITY, URINE: 1.008 (ref 1.005–1.030)
Squamous Epithelial / LPF: NONE SEEN
pH: 7 (ref 5.0–8.0)

## 2017-09-25 LAB — TROPONIN I: Troponin I: <0.03 ng/mL (ref <0.03)

## 2017-09-25 LAB — URINE DRUG SCREEN, QUALITATIVE (ARMC ONLY)
Amphetamines, Ur Screen: NOT DETECTED
Barbiturates, Ur Screen: NOT DETECTED
Benzodiazepine, Ur Scrn: NOT DETECTED
CANNABINOID 50 NG, UR ~~LOC~~: NOT DETECTED
Cocaine Metabolite,Ur ~~LOC~~: NOT DETECTED
MDMA (ECSTASY) UR SCREEN: NOT DETECTED
Methadone Scn, Ur: NOT DETECTED
Opiate, Ur Screen: NOT DETECTED
PHENCYCLIDINE (PCP) UR S: NOT DETECTED
Tricyclic, Ur Screen: NOT DETECTED

## 2017-09-25 LAB — ETHANOL: Alcohol, Ethyl (B): <10 mg/dL (ref <10)

## 2017-09-25 MED ORDER — CHLORDIAZEPOXIDE HCL 25 MG PO CAPS: 25.0000 mg | ORAL_CAPSULE | Freq: Four times a day (QID) | ORAL | 0 refills | Status: DC | PRN

## 2017-09-25 MED ORDER — THIAMINE HCL 100 MG PO TABS: 100.0000 mg | ORAL_TABLET | Freq: Every day | ORAL | 0 refills | Status: DC

## 2017-09-25 MED ORDER — MULTI-VITAMIN/MINERALS PO TABS: 1.0000 | ORAL_TABLET | Freq: Every day | ORAL | 1 refills | Status: DC

## 2017-09-25 MED ORDER — SODIUM CHLORIDE 0.9 % IV SOLN
Freq: Once | INTRAVENOUS | Status: AC
  Administered 2017-09-25: 15:00:00 via INTRAVENOUS

## 2017-09-25 MED ORDER — FOLIC ACID 1 MG PO TABS: 1.0000 mg | ORAL_TABLET | Freq: Every day | ORAL | 1 refills | Status: DC

## 2017-09-25 MED ORDER — SODIUM CHLORIDE 0.9 % IV SOLN
1000.0000 mL | Freq: Once | INTRAVENOUS | Status: AC
  Administered 2017-09-25: 1000 mL via INTRAVENOUS

## 2017-09-25 NOTE — ED Notes (Signed)
Social worker in room at this time.
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
## 2017-09-25 NOTE — ED Notes (Signed)
Pt up to the bathroom with one person assist.

## 2017-09-25 NOTE — ED Notes (Signed)
Pt taken to POV with sister in [facility]. NAD. VSS. Skin PWD. No tremors seen. Resp non-labored and equal. No questions or concerns voiced during discharge.
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
## 2017-09-25 NOTE — ED Notes (Signed)
Social Worker aware of consult.

## 2017-11-23 ENCOUNTER — Other Ambulatory Visit: Payer: Self-pay

## 2017-11-23 ENCOUNTER — Inpatient Hospital Stay
Admission: EM | Admit: 2017-11-23 | Discharge: 2017-11-26 | DRG: 641 | Disposition: A | Discharge status: Home / Self Care | Payer: Medicare Other | Attending: Internal Medicine | Admitting: Internal Medicine

## 2017-11-23 DIAGNOSIS — E876 Hypokalemia: Secondary | ICD-10-CM | POA: Diagnosis present

## 2017-11-23 DIAGNOSIS — F1029 Alcohol dependence with unspecified alcohol-induced disorder: Secondary | ICD-10-CM

## 2017-11-23 DIAGNOSIS — K760 Fatty (change of) liver, not elsewhere classified: Secondary | ICD-10-CM | POA: Diagnosis present

## 2017-11-23 DIAGNOSIS — R2681 Unsteadiness on feet: Secondary | ICD-10-CM | POA: Diagnosis present

## 2017-11-23 DIAGNOSIS — E871 Hypo-osmolality and hyponatremia: Secondary | ICD-10-CM | POA: Diagnosis present

## 2017-11-23 DIAGNOSIS — Z87891 Personal history of nicotine dependence: Secondary | ICD-10-CM | POA: Diagnosis not present

## 2017-11-23 DIAGNOSIS — Z681 Body mass index (BMI) 19 or less, adult: Secondary | ICD-10-CM | POA: Diagnosis not present

## 2017-11-23 DIAGNOSIS — I10 Essential (primary) hypertension: Secondary | ICD-10-CM | POA: Diagnosis present

## 2017-11-23 DIAGNOSIS — Z87820 Personal history of traumatic brain injury: Secondary | ICD-10-CM | POA: Diagnosis not present

## 2017-11-23 DIAGNOSIS — Z8249 Family history of ischemic heart disease and other diseases of the circulatory system: Secondary | ICD-10-CM | POA: Diagnosis not present

## 2017-11-23 DIAGNOSIS — R531 Weakness: Secondary | ICD-10-CM

## 2017-11-23 DIAGNOSIS — Z7982 Long term (current) use of aspirin: Secondary | ICD-10-CM

## 2017-11-23 DIAGNOSIS — F1020 Alcohol dependence, uncomplicated: Secondary | ICD-10-CM | POA: Diagnosis present

## 2017-11-23 DIAGNOSIS — E440 Moderate protein-calorie malnutrition: Secondary | ICD-10-CM | POA: Diagnosis present

## 2017-11-23 DIAGNOSIS — D6959 Other secondary thrombocytopenia: Secondary | ICD-10-CM | POA: Diagnosis present

## 2017-11-23 DIAGNOSIS — Z801 Family history of malignant neoplasm of trachea, bronchus and lung: Secondary | ICD-10-CM

## 2017-11-23 DIAGNOSIS — F10288 Alcohol dependence with other alcohol-induced disorder: Secondary | ICD-10-CM | POA: Diagnosis present

## 2017-11-23 LAB — CBC
HEMATOCRIT: 40.7 % (ref 40.0–52.0)
HEMOGLOBIN: 14.6 g/dL (ref 13.0–18.0)
MCH: 33 pg (ref 26.0–34.0)
MCHC: 35.8 g/dL (ref 32.0–36.0)
MCV: 92.2 fL (ref 80.0–100.0)
Platelets: 119 10*3/uL — ABNORMAL LOW (ref 150–440)
RBC: 4.42 MIL/uL (ref 4.40–5.90)
RDW: 13 % (ref 11.5–14.5)
WBC: 7.1 10*3/uL (ref 3.8–10.6)

## 2017-11-23 LAB — BASIC METABOLIC PANEL
ANION GAP: 13 (ref 5–15)
BUN: 11 mg/dL (ref 6–20)
CHLORIDE: 83 mmol/L — AB (ref 101–111)
CO2: 23 mmol/L (ref 22–32)
Calcium: 9.1 mg/dL (ref 8.9–10.3)
Creatinine, Ser: 0.59 mg/dL — ABNORMAL LOW (ref 0.61–1.24)
GFR calc non Af Amer: >60 mL/min (ref >60)
Glucose, Bld: 85 mg/dL (ref 65–99)
POTASSIUM: 3.2 mmol/L — AB (ref 3.5–5.1)
SODIUM: 119 mmol/L — AB (ref 135–145)

## 2017-11-23 LAB — URINALYSIS, COMPLETE (UACMP) WITH MICROSCOPIC
BACTERIA UA: NONE SEEN
BILIRUBIN URINE: NEGATIVE
Glucose, UA: NEGATIVE mg/dL
KETONES UR: 20 mg/dL — AB
Leukocytes, UA: NEGATIVE
NITRITE: NEGATIVE
PROTEIN: NEGATIVE mg/dL
RBC / HPF: NONE SEEN RBC/hpf (ref 0–5)
SQUAMOUS EPITHELIAL / LPF: NONE SEEN
Specific Gravity, Urine: 1.005 (ref 1.005–1.030)
WBC UA: NONE SEEN WBC/hpf (ref 0–5)
pH: 7 (ref 5.0–8.0)

## 2017-11-23 LAB — TSH: TSH: 2.083 u[IU]/mL (ref 0.350–4.500)

## 2017-11-23 LAB — MAGNESIUM: MAGNESIUM: 1.6 mg/dL — AB (ref 1.7–2.4)

## 2017-11-23 LAB — ETHANOL: Alcohol, Ethyl (B): <10 mg/dL (ref <10)

## 2017-11-23 LAB — AMMONIA: Ammonia: 28 umol/L (ref 9–35)

## 2017-11-23 MED ORDER — THIAMINE HCL 100 MG/ML IJ SOLN
100.0000 mg | Freq: Every day | INTRAMUSCULAR | Status: DC
  Administered 2017-11-23: 100 mg via INTRAVENOUS
  Filled 2017-11-23 (×2): qty 1
  Filled 2017-11-23: qty 2
  Filled 2017-11-23: qty 1

## 2017-11-23 MED ORDER — LORAZEPAM 2 MG PO TABS: 0.0000 mg | ORAL_TABLET | Freq: Four times a day (QID) | ORAL | Status: DC

## 2017-11-23 MED ORDER — LORAZEPAM 2 MG/ML IJ SOLN: 0.0000 mg | Freq: Two times a day (BID) | INTRAMUSCULAR | Status: DC

## 2017-11-23 MED ORDER — ASPIRIN EC 81 MG PO TBEC
81.0000 mg | DELAYED_RELEASE_TABLET | Freq: Every day | ORAL | Status: DC
  Administered 2017-11-24 – 2017-11-26 (×3): 81 mg via ORAL
  Filled 2017-11-23 (×3): qty 1

## 2017-11-23 MED ORDER — ADULT MULTIVITAMIN W/MINERALS CH
1.0000 | ORAL_TABLET | Freq: Every day | ORAL | Status: DC
  Administered 2017-11-24 – 2017-11-26 (×3): 1 via ORAL
  Filled 2017-11-23 (×3): qty 1

## 2017-11-23 MED ORDER — LORAZEPAM 2 MG/ML IJ SOLN: 0.0000 mg | Freq: Four times a day (QID) | INTRAMUSCULAR | Status: DC

## 2017-11-23 MED ORDER — LISINOPRIL 20 MG PO TABS
20.0000 mg | ORAL_TABLET | Freq: Two times a day (BID) | ORAL | Status: DC
  Administered 2017-11-23 – 2017-11-26 (×6): 20 mg via ORAL
  Filled 2017-11-23 (×7): qty 1

## 2017-11-23 MED ORDER — POTASSIUM CHLORIDE IN NACL 40-0.9 MEQ/L-% IV SOLN
INTRAVENOUS | Status: AC
  Administered 2017-11-23: 75 mL/h via INTRAVENOUS
  Filled 2017-11-23: qty 1000

## 2017-11-23 MED ORDER — LORAZEPAM 2 MG PO TABS
0.0000 mg | ORAL_TABLET | Freq: Four times a day (QID) | ORAL | Status: DC
  Administered 2017-11-25: 4 mg via ORAL
  Filled 2017-11-23: qty 2

## 2017-11-23 MED ORDER — BACLOFEN 10 MG PO TABS
10.0000 mg | ORAL_TABLET | Freq: Three times a day (TID) | ORAL | Status: DC
  Administered 2017-11-23 – 2017-11-25 (×6): 10 mg via ORAL
  Filled 2017-11-23 (×8): qty 1

## 2017-11-23 MED ORDER — CHLORDIAZEPOXIDE HCL 25 MG PO CAPS: 25.0000 mg | ORAL_CAPSULE | Freq: Four times a day (QID) | ORAL | Status: DC | PRN

## 2017-11-23 MED ORDER — ATENOLOL 25 MG PO TABS
25.0000 mg | ORAL_TABLET | Freq: Two times a day (BID) | ORAL | Status: DC
  Administered 2017-11-23 – 2017-11-26 (×6): 25 mg via ORAL
  Filled 2017-11-23 (×7): qty 1

## 2017-11-23 MED ORDER — VITAMIN B-1 100 MG PO TABS
100.0000 mg | ORAL_TABLET | Freq: Every day | ORAL | Status: DC
  Administered 2017-11-24: 100 mg via ORAL

## 2017-11-23 MED ORDER — HEPARIN SODIUM (PORCINE) 5000 UNIT/ML IJ SOLN
5000.0000 [IU] | Freq: Three times a day (TID) | INTRAMUSCULAR | Status: DC
  Administered 2017-11-23 – 2017-11-26 (×9): 5000 [IU] via SUBCUTANEOUS
  Filled 2017-11-23 (×9): qty 1

## 2017-11-23 MED ORDER — LORAZEPAM 2 MG PO TABS: 0.0000 mg | ORAL_TABLET | Freq: Two times a day (BID) | ORAL | Status: DC

## 2017-11-23 MED ORDER — LORAZEPAM 2 MG PO TABS: 0.0000 mg | ORAL_TABLET | ORAL | Status: DC

## 2017-11-23 MED ORDER — VITAMIN B-1 100 MG PO TABS
100.0000 mg | ORAL_TABLET | Freq: Every day | ORAL | Status: DC
  Administered 2017-11-25 – 2017-11-26 (×2): 100 mg via ORAL
  Filled 2017-11-23 (×3): qty 1

## 2017-11-23 MED ORDER — LORAZEPAM 2 MG/ML IJ SOLN: 0.0000 mg | INTRAMUSCULAR | Status: DC

## 2017-11-23 MED ORDER — MAGNESIUM SULFATE 2 GM/50ML IV SOLN
2.0000 g | Freq: Once | INTRAVENOUS | Status: AC
  Administered 2017-11-23: 16:00:00 2 g via INTRAVENOUS
  Filled 2017-11-23: qty 50

## 2017-11-23 MED ORDER — FOLIC ACID 1 MG PO TABS
1.0000 mg | ORAL_TABLET | Freq: Every day | ORAL | Status: DC
  Administered 2017-11-24 – 2017-11-26 (×3): 1 mg via ORAL
  Filled 2017-11-23 (×3): qty 1

## 2017-11-23 MED ORDER — DOCUSATE SODIUM 100 MG PO CAPS
100.0000 mg | ORAL_CAPSULE | Freq: Two times a day (BID) | ORAL | Status: DC | PRN
  Administered 2017-11-24 – 2017-11-25 (×2): 100 mg via ORAL
  Filled 2017-11-23 (×2): qty 1

## 2017-11-23 MED ORDER — SODIUM CHLORIDE 0.9 % IV BOLUS (SEPSIS)
1000.0000 mL | Freq: Once | INTRAVENOUS | Status: AC
  Administered 2017-11-23: 1000 mL via INTRAVENOUS

## 2017-11-23 NOTE — ED Notes (Signed)
Urine sent at this time. Pt states he doesn't need to have a BM at this time. RN will continue monitor.

## 2017-11-23 NOTE — ED Triage Notes (Signed)
First nurse note:  Weakness and diarrhea. Pt unsteady on feet.

## 2017-11-23 NOTE — ED Notes (Signed)
Pt presents today with abd pain and states that he can only drink beer and no feeding food just beer. Pt is A/O x4 EDP at bedside.
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
## 2017-11-23 NOTE — ED Notes (Signed)
This RN attempted 2 x of IV Notified Iris RN to come attempt RN will continue to monitor.

## 2017-11-23 NOTE — ED Triage Notes (Signed)
Pt c/o diarrhea for the past week with increased weakness. States he has had this issue before , states it is caused by drinking, states he drink about 12pk of beer/day. Pt states his sister brought him in today. States he has had one beer today.
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
## 2017-11-24 DIAGNOSIS — E440 Moderate protein-calorie malnutrition: Secondary | ICD-10-CM

## 2017-11-24 LAB — BASIC METABOLIC PANEL
ANION GAP: 6 (ref 5–15)
BUN: 11 mg/dL (ref 6–20)
CO2: 25 mmol/L (ref 22–32)
Calcium: 8.4 mg/dL — ABNORMAL LOW (ref 8.9–10.3)
Chloride: 97 mmol/L — ABNORMAL LOW (ref 101–111)
Creatinine, Ser: 0.56 mg/dL — ABNORMAL LOW (ref 0.61–1.24)
Glucose, Bld: 96 mg/dL (ref 65–99)
POTASSIUM: 3.6 mmol/L (ref 3.5–5.1)
SODIUM: 128 mmol/L — AB (ref 135–145)

## 2017-11-24 LAB — CBC
HCT: 37.4 % — ABNORMAL LOW (ref 40.0–52.0)
Hemoglobin: 13.4 g/dL (ref 13.0–18.0)
MCH: 33.3 pg (ref 26.0–34.0)
MCHC: 35.7 g/dL (ref 32.0–36.0)
MCV: 93.2 fL (ref 80.0–100.0)
Platelets: 90 10*3/uL — ABNORMAL LOW (ref 150–440)
RBC: 4.01 MIL/uL — AB (ref 4.40–5.90)
RDW: 12.7 % (ref 11.5–14.5)
WBC: 4.2 10*3/uL (ref 3.8–10.6)

## 2017-11-24 LAB — PHOSPHORUS: Phosphorus: 2.2 mg/dL — ABNORMAL LOW (ref 2.5–4.6)

## 2017-11-24 MED ORDER — POTASSIUM CHLORIDE CRYS ER 20 MEQ PO TBCR
40.0000 meq | EXTENDED_RELEASE_TABLET | Freq: Once | ORAL | Status: AC
  Administered 2017-11-24: 08:00:00 40 meq via ORAL
  Filled 2017-11-24: qty 2

## 2017-11-24 MED ORDER — ONDANSETRON HCL 4 MG/2ML IJ SOLN
4.0000 mg | Freq: Four times a day (QID) | INTRAMUSCULAR | Status: DC | PRN
  Administered 2017-11-24: 06:00:00 4 mg via INTRAVENOUS
  Filled 2017-11-24: qty 2

## 2017-11-24 MED ORDER — ENSURE ENLIVE PO LIQD
237.0000 mL | Freq: Two times a day (BID) | ORAL | Status: DC
  Administered 2017-11-24 – 2017-11-26 (×4): 237 mL via ORAL
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
## 2017-11-24 NOTE — Plan of Care (Deleted)
Pt alert and oriented. No complaints of pain or discomfort. VSS. Pt had thrombectomy and port removal done on 11/24/17. Liquid skin over incision, no drainage. Continues heparin drip. Continue to monitor.

## 2017-11-24 NOTE — Progress Notes (Signed)
Patient ID: Douglas Peterson, male   DOB: Jul 01, 1951, 67 y.o.   MRN: [id number]  [employer] PROGRESS NOTE  [name] IDH:[id number] DOB: July 04, 1951 DOA: 11/23/2017 PCP: [name], MD  HPI/Subjective: Patient states he feels a little bit better.  Came in with weakness and diarrhea.  Patient history of alcohol abuse.  Objective: Vitals:   11/24/17 1318 11/24/17 1607  BP: 140/68   Pulse: 64 70  Resp: 16   Temp: 98.8 F (37.1 C)   SpO2: 98% 98%    Intake/Output Summary (Last 24 hours) at 11/24/2017 1624 Last data filed at 11/24/2017 1350 Gross per 24 hour  Intake 303.75 ml  Output 1200 ml  Net -896.25 ml   Filed Weights   11/23/17 1132 11/23/17 1509  Weight: 53.5 kg (118 lb) 51.7 kg (113 lb 14.4 oz)    ROS: Review of Systems  Constitutional: Negative for chills and fever.  Eyes: Negative for blurred vision.  Respiratory: Negative for cough and shortness of breath.   Cardiovascular: Negative for chest pain.  Gastrointestinal: Negative for abdominal pain, constipation, diarrhea, nausea and vomiting.  Genitourinary: Negative for dysuria.  Musculoskeletal: Negative for joint pain.  Neurological: Negative for dizziness and headaches.   Exam: Physical Exam  HENT:  Nose: No mucosal edema.  Mouth/Throat: No oropharyngeal exudate or posterior oropharyngeal edema.  Eyes: Conjunctivae, EOM and lids are normal. Pupils are equal, round, and reactive to light.  Neck: No JVD present. Carotid bruit is not present. No edema present. No thyroid mass and no thyromegaly present.  Cardiovascular: S1 normal and S2 normal. Exam reveals no gallop.  No murmur heard. Pulses:      Dorsalis pedis pulses are 2+ on the right side, and 2+ on the left side.  Respiratory: No respiratory distress. He has no wheezes. He has no rhonchi. He has no rales.  GI: Soft. Bowel sounds are normal. There is no tenderness.  Musculoskeletal:       Right ankle: He exhibits no swelling.       Left  ankle: He exhibits no swelling.  Lymphadenopathy:    He has no cervical adenopathy.  Neurological: He is alert.  Chronic right arm weakness  Skin: Skin is warm. No rash noted. Nails show no clubbing.  Psychiatric: He has a normal mood and affect.      Data Reviewed: Basic Metabolic Panel: Recent Labs  Lab 11/23/17 1138 11/23/17 1430 11/24/17 0340  NA 119*  --  128*  K 3.2*  --  3.6  CL 83*  --  97*  CO2 23  --  25  GLUCOSE 85  --  96  BUN 11  --  11  CREATININE 0.59*  --  0.56*  CALCIUM 9.1  --  8.4*  MG  --  1.6*  --   PHOS  --   --  2.2*    Recent Labs  Lab 11/23/17 1541  AMMONIA 28   CBC: Recent Labs  Lab 11/23/17 1138 11/24/17 0340  WBC 7.1 4.2  HGB 14.6 13.4  HCT 40.7 37.4*  MCV 92.2 93.2  PLT 119* 90*   BNP (last 3 results) Recent Labs    09/22/17 0932  BNP 8.0    Scheduled Meds: . aspirin EC  81 mg Oral Daily  . atenolol  25 mg Oral BID  . baclofen  10 mg Oral TID  . feeding supplement (ENSURE ENLIVE)  237 mL Oral BID BM  . folic acid  1 mg Oral  Daily  . heparin  5,000 Units Subcutaneous Q8H  . lisinopril  20 mg Oral BID  . LORazepam  0-4 mg Intravenous Q6H   Or  . LORazepam  0-4 mg Oral Q6H  . multivitamin with minerals  1 tablet Oral Daily  . thiamine  100 mg Oral Daily   Or  . thiamine  100 mg Intravenous Daily    Assessment/Plan:  1. Severe hyponatremia likely secondary to alcohol abuse.  Hold on any IV fluids at this time since sodium seems to be coming up on its own at this point. 2. Alcohol abuse.  Patient must stop alcohol drinking.  No signs of withdrawal currently. 3. Essential hypertension on atenolol and lisinopril 4. 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 5. Diarrhea.  No recurrence of diarrhea at this point.  Looks like back on 10/20/2017 the patient was supposed to have stopped his lactulose. 6. Hepatic steatosis and thrombocytopenia and alcohol abuse.  Patient has been seen by the transplant team  but patient continues to drink so is not a candidate at this point in time.  Code Status:     Code Status Orders  (From admission, onward)        Start     Ordered   11/23/17 1503  Full code  Continuous     11/23/17 1502    Code Status History    Date Active Date Inactive Code Status Order ID Comments User Context   09/22/2017 12:02 09/22/2017 20:17 Full Code [id number]  Nena Polio, MD ED   09/16/2017 23:44 09/18/2017 15:50 Full Code [id number]  Lance Coon, MD Inpatient   09/16/2017 20:37 09/16/2017 23:43 Full Code [id number]  Orbie Pyo, MD ED   12/22/2016 11:26 12/25/2016 19:10 DNR [id number]  [name], MD Inpatient   12/17/2016 11:47 12/22/2016 11:26 Full Code [id number]  Henreitta Leber, MD Inpatient   09/12/2015 21:08 09/15/2015 15:01 Full Code [id number]  Henreitta Leber, MD Inpatient   07/09/2015 15:59 07/11/2015 21:10 Full Code [id number]  Bettey Costa, MD Inpatient     Disposition Plan: To be determined  Time spent: 28 minutes  [place]
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
## 2017-11-24 NOTE — Plan of Care (Signed)
  Progressing Education: Knowledge of General Education information will improve 11/24/2017 0349 - Progressing by Cathie Hoops, RN 11/24/2017 0345 - Progressing by Cathie Hoops, RN Health Behavior/Discharge Planning: Ability to manage health-related needs will improve 11/24/2017 0349 - Progressing by Cathie Hoops, RN 11/24/2017 0345 - Progressing by Cathie Hoops, RN Clinical Measurements: Ability to maintain clinical measurements within normal limits will improve 11/24/2017 0349 - Progressing by Cathie Hoops, RN 11/24/2017 0345 - Progressing by Cathie Hoops, RN Will remain free from infection 11/24/2017 0349 - Progressing by Cathie Hoops, RN 11/24/2017 0345 - Progressing by Cathie Hoops, RN Diagnostic test results will improve 11/24/2017 0349 - Progressing by Cathie Hoops, RN 11/24/2017 0345 - Progressing by Cathie Hoops, RN Respiratory complications will improve 11/24/2017 0349 - Progressing by Cathie Hoops, RN 11/24/2017 0345 - Progressing by Cathie Hoops, RN Cardiovascular complication will be avoided 11/24/2017 0349 - Progressing by Cathie Hoops, RN 11/24/2017 0345 - Progressing by Cathie Hoops, RN Activity: Risk for activity intolerance will decrease 11/24/2017 0349 - Progressing by Cathie Hoops, RN 11/24/2017 0345 - Progressing by Cathie Hoops, RN Nutrition: Adequate nutrition will be maintained 11/24/2017 0349 - Progressing by Cathie Hoops, RN 11/24/2017 0345 - Progressing by Cathie Hoops, RN Coping: Level of anxiety will decrease 11/24/2017 0349 - Progressing by Cathie Hoops, RN 11/24/2017 0345 - Progressing by Cathie Hoops, RN Elimination: Will not experience complications related to bowel motility 11/24/2017 0349 - Progressing by Cathie Hoops, RN 11/24/2017 0345 - Progressing by Cathie Hoops, RN Will not experience complications related to urinary retention 11/24/2017 0349 - Progressing by Cathie Hoops, RN 11/24/2017  0345 - Progressing by Cathie Hoops, RN Pain Managment: General experience of comfort will improve 11/24/2017 0349 - Progressing by Cathie Hoops, RN 11/24/2017 0345 - Progressing by Cathie Hoops, RN Safety: Ability to remain free from injury will improve 11/24/2017 0349 - Progressing by Cathie Hoops, RN 11/24/2017 0345 - Progressing by Cathie Hoops, RN Skin Integrity: Risk for impaired skin integrity will decrease 11/24/2017 0349 - Progressing by Cathie Hoops, RN 11/24/2017 0345 - Progressing by Cathie Hoops, RN

## 2017-11-25 ENCOUNTER — Encounter: Payer: Self-pay | Admitting: Internal Medicine

## 2017-11-25 LAB — COMPREHENSIVE METABOLIC PANEL
ALK PHOS: 104 U/L (ref 38–126)
ALT: 27 U/L (ref 17–63)
ANION GAP: 8 (ref 5–15)
AST: 42 U/L — ABNORMAL HIGH (ref 15–41)
Albumin: 4 g/dL (ref 3.5–5.0)
BUN: 10 mg/dL (ref 6–20)
CALCIUM: 9 mg/dL (ref 8.9–10.3)
CO2: 26 mmol/L (ref 22–32)
Chloride: 96 mmol/L — ABNORMAL LOW (ref 101–111)
Creatinine, Ser: 0.48 mg/dL — ABNORMAL LOW (ref 0.61–1.24)
GFR calc Af Amer: >60 mL/min (ref >60)
Glucose, Bld: 107 mg/dL — ABNORMAL HIGH (ref 65–99)
Potassium: 3.9 mmol/L (ref 3.5–5.1)
SODIUM: 130 mmol/L — AB (ref 135–145)
TOTAL PROTEIN: 6.9 g/dL (ref 6.5–8.1)
Total Bilirubin: 1 mg/dL (ref 0.3–1.2)

## 2017-11-25 LAB — MAGNESIUM: MAGNESIUM: 1.8 mg/dL (ref 1.7–2.4)

## 2017-11-25 MED ORDER — LORAZEPAM 1 MG PO TABS: 1.0000 mg | ORAL_TABLET | Freq: Four times a day (QID) | ORAL | Status: DC | PRN

## 2017-11-25 MED ORDER — MAGNESIUM SULFATE 2 GM/50ML IV SOLN
2.0000 g | Freq: Once | INTRAVENOUS | Status: AC
  Administered 2017-11-25: 09:00:00 2 g via INTRAVENOUS
  Filled 2017-11-25: qty 50

## 2017-11-25 MED ORDER — LORAZEPAM 2 MG/ML IJ SOLN: 1.0000 mg | Freq: Four times a day (QID) | INTRAMUSCULAR | Status: DC | PRN

## 2017-11-25 MED ORDER — K PHOS MONO-SOD PHOS DI & MONO 155-852-130 MG PO TABS
500.0000 mg | ORAL_TABLET | Freq: Two times a day (BID) | ORAL | Status: DC
  Administered 2017-11-25 (×2): 500 mg via ORAL
  Filled 2017-11-25 (×3): qty 2

## 2017-11-25 NOTE — Progress Notes (Signed)
Patient ID: Douglas Peterson, male   DOB: Aug 20, 1951, 67 y.o.   MRN: [id number]  [facility] PROGRESS NOTE  Douglas Peterson GMW:[id number] DOB: 07/18/1951 DOA: 11/23/2017 PCP: [name], MD  HPI/Subjective: Patient states he feels a little bit better.  Came in with weakness and diarrhea.  Patient history of alcohol abuse.  Objective: Vitals:   11/25/17 0800 11/25/17 0900  BP: (!) 172/84 135/80  Pulse: (!) 57 72  Resp:    Temp:  (!) 97.5 F (36.4 C)  SpO2:  96%    Filed Weights   11/23/17 1132 11/23/17 1509  Weight: 53.5 kg (118 lb) 51.7 kg (113 lb 14.4 oz)    ROS: Review of Systems  Unable to perform ROS: Acuity of condition   Exam: Physical Exam  Constitutional: He appears lethargic.  HENT:  Nose: No mucosal edema.  Mouth/Throat: No oropharyngeal exudate or posterior oropharyngeal edema.  Eyes: Conjunctivae, EOM and lids are normal. Pupils are equal, round, and reactive to light.  Neck: No JVD present. Carotid bruit is not present. No edema present. No thyroid mass and no thyromegaly present.  Cardiovascular: S1 normal and S2 normal. Exam reveals no gallop.  No murmur heard. Pulses:      Dorsalis pedis pulses are 2+ on the right side, and 2+ on the left side.  Respiratory: No respiratory distress. He has no wheezes. He has no rhonchi. He has no rales.  GI: Soft. Bowel sounds are normal. There is no tenderness.  Musculoskeletal:       Right ankle: He exhibits no swelling.       Left ankle: He exhibits no swelling.  Lymphadenopathy:    He has no cervical adenopathy.  Neurological: He appears lethargic.  Chronic right arm weakness  Skin: Skin is warm. No rash noted. Nails show no clubbing.  Psychiatric:  lethargic      Data Reviewed: Basic Metabolic Panel: Recent Labs  Lab 11/23/17 1138 11/23/17 1430 11/24/17 0340 11/25/17 0411  NA 119*  --  128* 130*  K 3.2*  --  3.6 3.9  CL 83*  --  97* 96*  CO2 23  --  25 26  GLUCOSE 85  --  96 107*  BUN 11   --  11 10  CREATININE 0.59*  --  0.56* 0.48*  CALCIUM 9.1  --  8.4* 9.0  MG  --  1.6*  --  1.8  PHOS  --   --  2.2*  --     Recent Labs  Lab 11/23/17 1541  AMMONIA 28   CBC: Recent Labs  Lab 11/23/17 1138 11/24/17 0340  WBC 7.1 4.2  HGB 14.6 13.4  HCT 40.7 37.4*  MCV 92.2 93.2  PLT 119* 90*   BNP (last 3 results) Recent Labs    09/22/17 0932  BNP 8.0    Scheduled Meds: . aspirin EC  81 mg Oral Daily  . atenolol  25 mg Oral BID  . feeding supplement (ENSURE ENLIVE)  237 mL Oral BID BM  . folic acid  1 mg Oral Daily  . heparin  5,000 Units Subcutaneous Q8H  . lisinopril  20 mg Oral BID  . multivitamin with minerals  1 tablet Oral Daily  . phosphorus  500 mg Oral BID  . thiamine  100 mg Oral Daily   Or  . thiamine  100 mg Intravenous Daily    Assessment/Plan:  1. Severe hyponatremia likely secondary to alcohol abuse.   2. Lethargy secondary to  given 4 mg of Ativan this morning.  Ativan dosing. 3. Alcohol abuse.  Patient must stop alcohol drinking.  Monitor for withdrawal. 4. Essential hypertension on atenolol and lisinopril 5. 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 6. Diarrhea.  No recurrence of diarrhea at this point.  Looks like back on 10/20/2017 the patient was supposed to have stopped his lactulose. 7.    Ultrasound of the liver last year concerning for cirrhosis.  Hepatic steatosis          and thrombocytopenia and alcohol abuse.  8.   Case discussed with sister at the bedside and daughter on the phone.  Patient will refuse anybody to come into his house.  Patient will go back to drinking.  If this is the case his overall prognosis is very poor.  Code Status:     Code Status Orders  (From admission, onward)        Start     Ordered   11/23/17 1503  Full code  Continuous     11/23/17 1502    Code Status History    Date Active Date Inactive Code Status Order ID Comments User Context   09/22/2017 12:02 09/22/2017 20:17 Full  Code [id number]  Nena Polio, MD ED   09/16/2017 23:44 09/18/2017 15:50 Full Code [id number]  Lance Coon, MD Inpatient   09/16/2017 20:37 09/16/2017 23:43 Full Code [id number]  Orbie Pyo, MD ED   12/22/2016 11:26 12/25/2016 19:10 DNR [id number]  [name], MD Inpatient   12/17/2016 11:47 12/22/2016 11:26 Full Code [id number]  Henreitta Leber, MD Inpatient   09/12/2015 21:08 09/15/2015 15:01 Full Code [id number]  Henreitta Leber, MD Inpatient   07/09/2015 15:59 07/11/2015 21:10 Full Code [id number]  Bettey Costa, MD Inpatient     Disposition Plan: To be determined  Time spent: 28 minutes, and speaking with sister and daughter.   [employer]

## 2017-11-25 NOTE — Care Management Note (Signed)
Case Management Note  Patient Details  Name: Douglas Peterson MRN: [id number] Date of Birth: 07-Mar-1951  Subjective/Objective:   Admitted to [facility] with  The diagnosis of hyponatremia. Lives alone. Daughter is [name] [phone]). Last seen Dr. Raechel Ache December 2018. Prescriptions are filled at [facility] on [address]. Home Health many years ago. Doesn't remember name of agency. No skilled nursing. No home oxygen. Rolling walker, cane, and gab bars in the home. Takes care of all basic and instrumental activities of daily living himself, drives. Sister helps with cleaning. No falls. Good appetite. Sister will transport               Action/Plan: Physical therapy evaluation completed. Recommending home with home health/physical therapy. Declining these services. States he goes to [facility] 5 days a week   Expected Discharge Date:                  Expected Discharge Plan:     In-House Referral:   yes  Discharge planning Services   yes  Post Acute Care Choice:    Choice offered to:     DME Arranged:    DME Agency:     HH Arranged:    [place] Agency:     Status of Service:     If discussed at [employer] [facility] [employer], dates discussed:    Additional Comments:  Shelbie Ammons, RN MSN CCM Care Management [phone] 11/25/2017, 8:37 AM

## 2017-11-25 NOTE — Care Management Important Message (Signed)
Important Message  Patient Details  Name: Douglas Peterson MRN: [id number] Date of Birth: 04/21/1951   Medicare Important Message Given:  Yes    Shelbie Ammons, RN 11/25/2017, 7:06 AM

## 2017-11-26 LAB — BASIC METABOLIC PANEL
Anion gap: 7 (ref 5–15)
BUN: 9 mg/dL (ref 6–20)
CALCIUM: 9.2 mg/dL (ref 8.9–10.3)
CO2: 29 mmol/L (ref 22–32)
Chloride: 98 mmol/L — ABNORMAL LOW (ref 101–111)
Creatinine, Ser: 0.54 mg/dL — ABNORMAL LOW (ref 0.61–1.24)
GFR calc Af Amer: >60 mL/min (ref >60)
GFR calc non Af Amer: >60 mL/min (ref >60)
GLUCOSE: 108 mg/dL — AB (ref 65–99)
Potassium: 3.8 mmol/L (ref 3.5–5.1)
Sodium: 134 mmol/L — ABNORMAL LOW (ref 135–145)

## 2017-11-26 LAB — PHOSPHORUS: Phosphorus: 4.8 mg/dL — ABNORMAL HIGH (ref 2.5–4.6)

## 2017-11-26 LAB — MAGNESIUM: Magnesium: 2 mg/dL (ref 1.7–2.4)

## 2017-11-26 LAB — AMMONIA: Ammonia: 33 umol/L (ref 9–35)

## 2017-11-26 MED ORDER — ATENOLOL 25 MG PO TABS: 25.0000 mg | ORAL_TABLET | Freq: Every day | ORAL | Status: DC

## 2017-11-26 MED ORDER — POLYETHYLENE GLYCOL 3350 17 G PO PACK
17.0000 g | PACK | Freq: Every day | ORAL | Status: DC
  Administered 2017-11-26: 13:00:00 17 g via ORAL
  Filled 2017-11-26: qty 1

## 2017-11-26 MED ORDER — DOCUSATE SODIUM 100 MG PO CAPS: 100.0000 mg | ORAL_CAPSULE | Freq: Two times a day (BID) | ORAL | 0 refills | Status: DC | PRN

## 2017-11-26 MED ORDER — LISINOPRIL 20 MG PO TABS: 20.0000 mg | ORAL_TABLET | Freq: Every day | ORAL | 0 refills | Status: DC

## 2017-11-26 MED ORDER — POLYETHYLENE GLYCOL 3350 17 G PO PACK: 17.0000 g | PACK | Freq: Every day | ORAL | 0 refills | Status: DC

## 2017-11-26 MED ORDER — LORAZEPAM 0.5 MG PO TABS: ORAL_TABLET | ORAL | 0 refills | Status: DC

## 2017-11-26 MED ORDER — ATENOLOL 25 MG PO TABS: 25.0000 mg | ORAL_TABLET | Freq: Every day | ORAL | 0 refills | Status: DC

## 2017-11-26 MED ORDER — ENSURE ENLIVE PO LIQD: 237.0000 mL | Freq: Two times a day (BID) | ORAL | 60 refills | Status: DC

## 2017-11-26 MED ORDER — FLEET ENEMA 7-19 GM/118ML RE ENEM: 1.0000 | ENEMA | Freq: Every day | RECTAL | Status: DC | PRN

## 2017-11-26 MED ORDER — LISINOPRIL 20 MG PO TABS: 20.0000 mg | ORAL_TABLET | Freq: Every day | ORAL | Status: DC

## 2017-11-26 NOTE — Discharge Instructions (Signed)
Alcohol Use Disorder Alcohol use disorder is when your drinking disrupts your daily life. When you have this condition, you drink too much alcohol and you cannot control your drinking. Alcohol use disorder can cause serious problems with your physical health. It can affect your brain, heart, liver, pancreas, immune system, stomach, and intestines. Alcohol use disorder can increase your risk for certain cancers and cause problems with your mental health, such as depression, anxiety, psychosis, delirium, and dementia. People with this disorder risk hurting themselves and others. What are the causes? This condition is caused by drinking too much alcohol over time. It is not caused by drinking too much alcohol only one or two times. Some people with this condition drink alcohol to cope with or escape from negative life events. Others drink to relieve pain or symptoms of mental illness. What increases the risk? You are more likely to develop this condition if:  You have a family history of alcohol use disorder.  Your culture encourages drinking to the point of intoxication, or makes alcohol easy to get.  You had a mood or conduct disorder in childhood.  You have been a victim of abuse.  You are an adolescent and: ? You have poor grades or difficulties in school. ? Your caregivers do not talk to you about saying no to alcohol, or supervise your activities. ? You are impulsive or you have trouble with self-control.  What are the signs or symptoms? Symptoms of this condition include:  Drinkingmore than you want to.  Drinking for longer than you want to.  Trying several times to drink less or to control your drinking.  Spending a lot of time getting alcohol, drinking, or recovering from drinking.  Craving alcohol.  Having problems at work, at school, or at home due to drinking.  Having problems in relationships due to drinking.  Drinking when it is dangerous to drink, such as before  driving a car.  Continuing to drink even though you know you might have a physical or mental problem related to drinking.  Needing more and more alcohol to get the same effect you want from the alcohol (building up tolerance).  Having symptoms of withdrawal when you stop drinking. Symptoms of withdrawal include: ? Fatigue. ? Nightmares. ? Trouble sleeping. ? Depression. ? Anxiety. ? Fever. ? Seizures. ? Severe confusion. ? Feeling or seeing things that are not there (hallucinations). ? Tremors. ? Rapid heart rate. ? Rapid breathing. ? High blood pressure.  Drinking to avoid symptoms of withdrawal.  How is this diagnosed? This condition is diagnosed with an assessment. Your health care provider may start the assessment by asking three or four questions about your drinking. Your health care provider may perform a physical exam or do lab tests to see if you have physical problems resulting from alcohol use. She or he may refer you to a mental health professional for evaluation. How is this treated? Some people with alcohol use disorder are able to reduce their alcohol use to low-risk levels. Others need to completely quit drinking alcohol. When necessary, mental health professionals with specialized training in substance use treatment can help. Your health care provider can help you decide how severe your alcohol use disorder is and what type of treatment you need. The following forms of treatment are available:  Detoxification. Detoxification involves quitting drinking and using prescription medicines within the first week to help lessen withdrawal symptoms. This treatment is important for people who have had withdrawal symptoms before and for  heavy drinkers who are likely to have withdrawal symptoms. Alcohol withdrawal can be dangerous, and in severe cases, it can cause death. Detoxification may be provided in a home, community, or primary care setting, or in a hospital or substance use  treatment facility.  Counseling. This treatment is also called talk therapy. It is provided by substance use treatment counselors. A counselor can address the reasons you use alcohol and suggest ways to keep you from drinking again or to prevent problem drinking. The goals of talk therapy are to: ? Find healthy activities and ways for you to cope with stress. ? Identify and avoid the things that trigger your alcohol use. ? Help you learn how to handle cravings.  Medicines.Medicines can help treat alcohol use disorder by: ? Decreasing alcohol cravings. ? Decreasing the positive feeling you have when you drink alcohol. ? Causing an uncomfortable physical reaction when you drink alcohol (aversion therapy).  Support groups. Support groups are led by people who have quit drinking. They provide emotional support, advice, and guidance.  These forms of treatment are often combined. Some people with this condition benefit from a combination of treatments provided by specialized substance use treatment centers. Follow these instructions at home:  Take over-the-counter and prescription medicines only as told by your health care provider.  Check with your health care provider before starting any new medicines.  Ask friends and family members not to offer you alcohol.  Avoid situations where alcohol is served, including gatherings where others are drinking alcohol.  Create a plan for what to do when you are tempted to use alcohol.  Find hobbies or activities that you enjoy that do not include alcohol.  Keep all follow-up visits as told by your health care provider. This is important. How is this prevented?  If you drink, limit alcohol intake to no more than 1 drink a day for nonpregnant women and 2 drinks a day for men. One drink equals 12 oz of beer, 5 oz of wine, or 1 oz of hard liquor.  If you have a mental health condition, get treatment and support.  Do not give alcohol to  adolescents.  If you are an adolescent: ? Do not drink alcohol. ? Do not be afraid to say no if someone offers you alcohol. Speak up about why you do not want to drink. You can be a positive role model for your friends and set a good example for those around you by not drinking alcohol. ? If your friends drink, spend time with others who do not drink alcohol. Make new friends who do not use alcohol. ? Find healthy ways to manage stress and emotions, such as meditation or deep breathing, exercise, spending time in nature, listening to music, or talking with a trusted friend or family member. Contact a health care provider if:  You are not able to take your medicines as told.  Your symptoms get worse.  You return to drinking alcohol (relapse) and your symptoms get worse. Get help right away if:  You have thoughts about hurting yourself or others. If you ever feel like you may hurt yourself or others, or have thoughts about taking your own life, get help right away. You can go to your nearest emergency department or call:  Your local emergency services (911 in the [place]  A suicide crisis helpline, such as the [facility] at 575 137 8411. This is open 24 hours a day.  Summary  Alcohol use disorder is when your  drinking disrupts your daily life. When you have this condition, you drink too much alcohol and you cannot control your drinking.  Treatment may include detoxification, counseling, medicine, and support groups.  Ask friends and family members not to offer you alcohol. Avoid situations where alcohol is served.  Get help right away if you have thoughts about hurting yourself or others. This information is not intended to replace advice given to you by your health care provider. Make sure you discuss any questions you have with your health care provider. Document Released: 11/27/2004 Document Revised: 07/17/2016 Document Reviewed: 07/17/2016 Elsevier  Interactive Patient Education  [employer].
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
## 2017-11-26 NOTE — Clinical Social Work Placement (Addendum)
Patient has been accepted to [facility] Room: 711 Report: [phone] to Yaakov Guthrie RN Patient will transport by family car.  RN aware of plan. Transfer after 1pm   CLINICAL SOCIAL WORK PLACEMENT  NOTE  Date:  11/26/2017  Patient Details  Name: Douglas Peterson MRN: [id number] Date of Birth: 1951-01-03  Clinical Social Work is seeking post-discharge placement for this patient at the [facility] level of care (*CSW will initial, date and re-position this form in  chart as items are completed):  Yes   Patient/family provided with [place] Work Department's list of facilities offering this level of care within the geographic area requested by the patient (or if unable, by the patient's family).  Yes   Patient/family informed of their freedom to choose among providers that offer the needed level of care, that participate in Medicare, Medicaid or managed care program needed by the patient, have an available bed and are willing to accept the patient.  Yes   Patient/family informed of [facility]'s ownership interest in [facility] and [facility], as well as of the fact that they are under no obligation to receive care at these facilities.  PASRR submitted to EDS on 11/26/17     PASRR number received on       Existing PASRR number confirmed on 11/26/17     FL2 transmitted to all facilities in geographic area requested by pt/family on 11/26/17     FL2 transmitted to all facilities within larger geographic area on       Patient informed that his/her managed care company has contracts with or will negotiate with certain facilities, including the following:            Patient/family informed of bed offers received.  11/26/2017   Patient chooses bed at     [facility]  Physician recommends and patient chooses bed at    [facility] Patient to be transferred to   on  .  11/26/2017   Patient to be transferred to facility by     Sister, family  car  Patient family notified on   of transfer. Sister in room at time of assessment  Name of family member notified:        PHYSICIAN Please sign FL2     Additional Comment:    _______________________________________________ Lilly Cove, LCSW 11/26/2017, 10:06 AM
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
## 2017-11-26 NOTE — NC FL2 (Signed)
[place] LEVEL OF CARE SCREENING TOOL     IDENTIFICATION  Patient Name: Douglas Peterson Birthdate: 08/19/1951 Sex: male Admission Date (Current Location): 11/23/2017  [place] and [place] Number:  Engineering geologist and Address:  [facility], [address] [place], [place] [address]      Provider Number: [id number]  Attending Physician Name and Address:  Loletha Grayer, MD  Relative Name and Phone Number:       Current Level of Care: Hospital Recommended Level of Care: [place] Prior Approval Number:    Date Approved/Denied:   PASRR Number:   [id number]  Discharge Plan: SNF    Current Diagnoses: Patient Active Problem List   Diagnosis Date Noted  . Malnutrition of moderate degree 11/24/2017  . HTN (hypertension) 09/16/2017  . Hypothermia 12/17/2016  . Alcohol use   . Diarrhea   . Hyponatremia 07/09/2015    Orientation RESPIRATION BLADDER Height & Weight     Self, Time, Situation, Place  Normal Incontinent Weight: 113 lb 14.4 oz (51.7 kg) Height:  5' 4"  (162.6 cm)  BEHAVIORAL SYMPTOMS/MOOD NEUROLOGICAL BOWEL NUTRITION STATUS      Continent Diet(see DC summary)  AMBULATORY STATUS COMMUNICATION OF NEEDS Skin   Extensive Assist Verbally Normal                       Personal Care Assistance Level of Assistance  Bathing, Feeding, Dressing Bathing Assistance: Limited assistance Feeding assistance: Independent Dressing Assistance: Limited assistance     Functional Limitations Info  Sight, Hearing, Speech Sight Info: Adequate Hearing Info: Adequate Speech Info: Adequate    SPECIAL CARE FACTORS FREQUENCY  PT (By licensed PT), OT (By licensed OT)     PT Frequency: 5x OT Frequency: 6x            Contractures Contractures Info: Not present    Additional Factors Info  Code Status, Allergies, Isolation Precautions Code Status Info: Full Code Allergies Info: NKA     Isolation  Precautions Info: Enteric Pre     Current Medications (11/26/2017):  This is the current hospital active medication list Current Facility-Administered Medications  Medication Dose Route Frequency Provider Last Rate Last Dose  . aspirin EC tablet 81 mg  81 mg Oral Daily Vaughan Basta, MD   81 mg at 11/26/17 8185  . [START ON 11/27/2017] atenolol (TENORMIN) tablet 25 mg  25 mg Oral QHS Wieting, Richard, MD      . docusate sodium (COLACE) capsule 100 mg  100 mg Oral BID PRN Vaughan Basta, MD   100 mg at 11/25/17 0523  . feeding supplement (ENSURE ENLIVE) (ENSURE ENLIVE) liquid 237 mL  237 mL Oral BID BM Loletha Grayer, MD   237 mL at 11/25/17 1443  . folic acid (FOLVITE) tablet 1 mg  1 mg Oral Daily Vaughan Basta, MD   1 mg at 11/26/17 0812  . heparin injection 5,000 Units  5,000 Units Subcutaneous Q8H Vaughan Basta, MD   5,000 Units at 11/26/17 0540  . [START ON 11/27/2017] lisinopril (PRINIVIL,ZESTRIL) tablet 20 mg  20 mg Oral Daily Wieting, Richard, MD      . LORazepam (ATIVAN) injection 1 mg  1 mg Intravenous Q6H PRN Lenis Noon, [facility]       Or  . LORazepam (ATIVAN) tablet 1 mg  1 mg Oral Q6H PRN Lenis Noon, RPH      . multivitamin with minerals tablet 1 tablet  1 tablet  Oral Daily Vaughan Basta, MD   1 tablet at 11/26/17 [phone]  . ondansetron (ZOFRAN) injection 4 mg  4 mg Intravenous Q6H PRN Saundra Shelling, MD   4 mg at 11/24/17 1165  . polyethylene glycol (MIRALAX / GLYCOLAX) packet 17 g  17 g Oral Daily Wieting, Richard, MD      . sodium phosphate (FLEET) 7-19 GM/118ML enema 1 enema  1 enema Rectal Daily PRN Wieting, Richard, MD      . thiamine (VITAMIN B-1) tablet 100 mg  100 mg Oral Daily [name], MD   100 mg at 11/26/17 7903     Discharge Medications: Please see discharge summary for a list of discharge medications.  Relevant Imaging Results:  Relevant Lab Results:   Additional Information SSN [id number]  Lilly Cove, LCSW

## 2017-11-26 NOTE — Progress Notes (Signed)
Call report to [facility] @ [phone]. Spoke with [place].  No further questions at termination of phone call. Pt. Discharged in care of sister to transport to [facility] facility.

## 2018-02-01 ENCOUNTER — Encounter: Payer: Self-pay | Admitting: Emergency Medicine

## 2018-02-01 ENCOUNTER — Other Ambulatory Visit: Payer: Self-pay

## 2018-02-01 ENCOUNTER — Emergency Department
Admission: EM | Admit: 2018-02-01 | Discharge: 2018-02-01 | Disposition: A | Discharge status: Home / Self Care | Payer: Medicare Other | Attending: Emergency Medicine | Admitting: Emergency Medicine

## 2018-02-01 DIAGNOSIS — I10 Essential (primary) hypertension: Secondary | ICD-10-CM | POA: Diagnosis not present

## 2018-02-01 DIAGNOSIS — F1010 Alcohol abuse, uncomplicated: Secondary | ICD-10-CM | POA: Diagnosis not present

## 2018-02-01 DIAGNOSIS — Z79899 Other long term (current) drug therapy: Secondary | ICD-10-CM | POA: Diagnosis not present

## 2018-02-01 DIAGNOSIS — Z87891 Personal history of nicotine dependence: Secondary | ICD-10-CM | POA: Diagnosis not present

## 2018-02-01 DIAGNOSIS — R531 Weakness: Secondary | ICD-10-CM | POA: Insufficient documentation

## 2018-02-01 DIAGNOSIS — Z7982 Long term (current) use of aspirin: Secondary | ICD-10-CM | POA: Diagnosis not present

## 2018-02-01 LAB — URINALYSIS, COMPLETE (UACMP) WITH MICROSCOPIC
BACTERIA UA: NONE SEEN
BILIRUBIN URINE: NEGATIVE
Glucose, UA: NEGATIVE mg/dL
Hgb urine dipstick: NEGATIVE
Ketones, ur: NEGATIVE mg/dL
LEUKOCYTES UA: NEGATIVE
Nitrite: NEGATIVE
PROTEIN: 30 mg/dL — AB
SPECIFIC GRAVITY, URINE: 1.018 (ref 1.005–1.030)
pH: 5 (ref 5.0–8.0)

## 2018-02-01 LAB — COMPREHENSIVE METABOLIC PANEL
ALBUMIN: 4.8 g/dL (ref 3.5–5.0)
ALK PHOS: 151 U/L — AB (ref 38–126)
ALT: 33 U/L (ref 17–63)
ANION GAP: 12 (ref 5–15)
AST: 61 U/L — ABNORMAL HIGH (ref 15–41)
BUN: 8 mg/dL (ref 6–20)
CHLORIDE: 95 mmol/L — AB (ref 101–111)
CO2: 28 mmol/L (ref 22–32)
Calcium: 9 mg/dL (ref 8.9–10.3)
Creatinine, Ser: 0.46 mg/dL — ABNORMAL LOW (ref 0.61–1.24)
GFR calc non Af Amer: >60 mL/min (ref >60)
GLUCOSE: 90 mg/dL (ref 65–99)
POTASSIUM: 4 mmol/L (ref 3.5–5.1)
SODIUM: 135 mmol/L (ref 135–145)
Total Bilirubin: 1.2 mg/dL (ref 0.3–1.2)
Total Protein: 8.5 g/dL — ABNORMAL HIGH (ref 6.5–8.1)

## 2018-02-01 LAB — CBC
HEMATOCRIT: 43.3 % (ref 40.0–52.0)
Hemoglobin: 14.8 g/dL (ref 13.0–18.0)
MCH: 31.9 pg (ref 26.0–34.0)
MCHC: 34.1 g/dL (ref 32.0–36.0)
MCV: 93.5 fL (ref 80.0–100.0)
Platelets: 126 10*3/uL — ABNORMAL LOW (ref 150–440)
RBC: 4.63 MIL/uL (ref 4.40–5.90)
RDW: 13.3 % (ref 11.5–14.5)
WBC: 5.8 10*3/uL (ref 3.8–10.6)

## 2018-02-01 LAB — TROPONIN I

## 2018-02-01 LAB — LIPASE, BLOOD: Lipase: 42 U/L (ref 11–51)

## 2018-02-01 MED ORDER — THIAMINE HCL 100 MG/ML IJ SOLN
Freq: Once | INTRAVENOUS | Status: AC
  Administered 2018-02-01: 19:00:00 via INTRAVENOUS
  Filled 2018-02-01: qty 1000

## 2018-02-01 MED ORDER — LORAZEPAM 2 MG/ML IJ SOLN
1.0000 mg | Freq: Once | INTRAMUSCULAR | Status: AC
  Administered 2018-02-01: 1 mg via INTRAVENOUS
  Filled 2018-02-01: qty 1
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
## 2018-02-01 NOTE — ED Triage Notes (Signed)
Here for generalized weakness, pt reports can barely walk. Drinks about 1 bottle wine per day.  Pain also to upper abdomen.  No fevers. No vomiting . Last drink this morning some time.

## 2018-02-01 NOTE — Discharge Instructions (Addendum)
Please seek medical attention for any high fevers, chest pain, shortness of breath, change in behavior, persistent vomiting, bloody stool or any other new or concerning symptoms.  

## 2018-02-01 NOTE — ED Notes (Signed)
Patient discharge and follow up information reviewed with patient by ED nursing staff and patient given the opportunity to ask questions pertaining to ED visit and discharge plan of care. Patient advised that should symptoms not continue to improve, resolve entirely, or should new symptoms develop then a follow up visit with their PCP or a return visit to the ED may be warranted. Patient verbalized consent and understanding of discharge plan of care including potential need for further evaluation. Patient being discharged in stable condition per attending ED physician on duty.  Pt given information on RTS, verbalized understanding, pt being discharged with his sister.

## 2018-03-04 ENCOUNTER — Encounter: Payer: Self-pay | Admitting: *Deleted

## 2018-03-04 ENCOUNTER — Emergency Department
Admission: EM | Admit: 2018-03-04 | Discharge: 2018-03-04 | Disposition: A | Discharge status: Home / Self Care | Payer: Medicare Other | Attending: Emergency Medicine | Admitting: Emergency Medicine

## 2018-03-04 DIAGNOSIS — F10120 Alcohol abuse with intoxication, uncomplicated: Secondary | ICD-10-CM | POA: Diagnosis not present

## 2018-03-04 DIAGNOSIS — Z87891 Personal history of nicotine dependence: Secondary | ICD-10-CM | POA: Insufficient documentation

## 2018-03-04 DIAGNOSIS — Z7982 Long term (current) use of aspirin: Secondary | ICD-10-CM | POA: Diagnosis not present

## 2018-03-04 DIAGNOSIS — Z79899 Other long term (current) drug therapy: Secondary | ICD-10-CM | POA: Diagnosis not present

## 2018-03-04 DIAGNOSIS — F1010 Alcohol abuse, uncomplicated: Secondary | ICD-10-CM

## 2018-03-04 DIAGNOSIS — I10 Essential (primary) hypertension: Secondary | ICD-10-CM | POA: Insufficient documentation

## 2018-03-04 LAB — CBC WITH DIFFERENTIAL/PLATELET
Basophils Absolute: 0 10*3/uL (ref 0–0.1)
Basophils Relative: 1 %
Eosinophils Absolute: 0 10*3/uL (ref 0–0.7)
Eosinophils Relative: 1 %
HEMATOCRIT: 37.6 % — AB (ref 40.0–52.0)
HEMOGLOBIN: 13.2 g/dL (ref 13.0–18.0)
LYMPHS ABS: 1.3 10*3/uL (ref 1.0–3.6)
LYMPHS PCT: 39 %
MCH: 33.2 pg (ref 26.0–34.0)
MCHC: 35.1 g/dL (ref 32.0–36.0)
MCV: 94.5 fL (ref 80.0–100.0)
Monocytes Absolute: 0.5 10*3/uL (ref 0.2–1.0)
Monocytes Relative: 13 %
NEUTROS ABS: 1.6 10*3/uL (ref 1.4–6.5)
NEUTROS PCT: 46 %
Platelets: 64 10*3/uL — ABNORMAL LOW (ref 150–440)
RBC: 3.98 MIL/uL — AB (ref 4.40–5.90)
RDW: 14.5 % (ref 11.5–14.5)
WBC: 3.4 10*3/uL — AB (ref 3.8–10.6)

## 2018-03-04 LAB — COMPREHENSIVE METABOLIC PANEL
ALK PHOS: 141 U/L — AB (ref 38–126)
ALT: 40 U/L (ref 17–63)
AST: 127 U/L — ABNORMAL HIGH (ref 15–41)
Albumin: 3.9 g/dL (ref 3.5–5.0)
Anion gap: 8 (ref 5–15)
BUN: 5 mg/dL — ABNORMAL LOW (ref 6–20)
CALCIUM: 7.6 mg/dL — AB (ref 8.9–10.3)
CO2: 27 mmol/L (ref 22–32)
CREATININE: 0.37 mg/dL — AB (ref 0.61–1.24)
Chloride: 100 mmol/L — ABNORMAL LOW (ref 101–111)
GFR calc non Af Amer: >60 mL/min (ref >60)
Glucose, Bld: 95 mg/dL (ref 65–99)
Potassium: 3.8 mmol/L (ref 3.5–5.1)
Sodium: 135 mmol/L (ref 135–145)
Total Bilirubin: 0.6 mg/dL (ref 0.3–1.2)
Total Protein: 6.8 g/dL (ref 6.5–8.1)

## 2018-03-04 MED ORDER — SODIUM CHLORIDE 0.9 % IV BOLUS
1000.0000 mL | Freq: Once | INTRAVENOUS | Status: AC
  Administered 2018-03-04: 1000 mL via INTRAVENOUS

## 2018-03-04 MED ORDER — ONDANSETRON 4 MG PO TBDP
4.0000 mg | ORAL_TABLET | Freq: Once | ORAL | Status: AC
  Administered 2018-03-04: 4 mg via ORAL
  Filled 2018-03-04: qty 1

## 2018-03-04 MED ORDER — ONDANSETRON HCL 4 MG/2ML IJ SOLN
4.0000 mg | Freq: Once | INTRAMUSCULAR | Status: AC
  Administered 2018-03-04: 4 mg via INTRAVENOUS
  Filled 2018-03-04: qty 2

## 2018-03-04 MED ORDER — MAGNESIUM OXIDE 400 MG PO TABS
400.00 | ORAL_TABLET | ORAL | Status: DC
Start: 2018-03-03 — End: 2018-03-04

## 2018-03-04 MED ORDER — M.V.I. ADULT IV INJ
INJECTION | Freq: Once | INTRAVENOUS | Status: AC
  Administered 2018-03-04: 21:00:00 via INTRAVENOUS
  Filled 2018-03-04: qty 1000

## 2018-03-04 NOTE — ED Notes (Signed)
ED Provider at bedside. 
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
## 2018-03-04 NOTE — ED Triage Notes (Signed)
Per EMS pt called EMS because he was depressed and drank 6 bottles of wine. Pt states he started drinking wine and then couldn't do anything else. Pt is Alert and pleasant answering questions. Pt has his cell phone

## 2018-03-04 NOTE — Discharge Instructions (Addendum)
Please seek medical attention for any high fevers, chest pain, shortness of breath, change in behavior, persistent vomiting, bloody stool or any other new or concerning symptoms.  

## 2018-03-07 ENCOUNTER — Emergency Department: Payer: Medicare Other

## 2018-03-07 ENCOUNTER — Inpatient Hospital Stay
Admission: EM | Admit: 2018-03-07 | Discharge: 2018-03-16 | DRG: 896 | Disposition: A | Discharge status: Skilled Nursing Facility | Payer: Medicare Other | Attending: Internal Medicine | Admitting: Internal Medicine

## 2018-03-07 ENCOUNTER — Other Ambulatory Visit: Payer: Self-pay

## 2018-03-07 DIAGNOSIS — Z66 Do not resuscitate: Secondary | ICD-10-CM | POA: Diagnosis present

## 2018-03-07 DIAGNOSIS — F10231 Alcohol dependence with withdrawal delirium: Principal | ICD-10-CM | POA: Diagnosis present

## 2018-03-07 DIAGNOSIS — E440 Moderate protein-calorie malnutrition: Secondary | ICD-10-CM | POA: Diagnosis present

## 2018-03-07 DIAGNOSIS — R509 Fever, unspecified: Secondary | ICD-10-CM

## 2018-03-07 DIAGNOSIS — E878 Other disorders of electrolyte and fluid balance, not elsewhere classified: Secondary | ICD-10-CM | POA: Diagnosis present

## 2018-03-07 DIAGNOSIS — I10 Essential (primary) hypertension: Secondary | ICD-10-CM | POA: Diagnosis present

## 2018-03-07 DIAGNOSIS — E871 Hypo-osmolality and hyponatremia: Secondary | ICD-10-CM | POA: Diagnosis present

## 2018-03-07 DIAGNOSIS — Z8249 Family history of ischemic heart disease and other diseases of the circulatory system: Secondary | ICD-10-CM

## 2018-03-07 DIAGNOSIS — F1027 Alcohol dependence with alcohol-induced persisting dementia: Secondary | ICD-10-CM | POA: Diagnosis present

## 2018-03-07 DIAGNOSIS — Z7289 Other problems related to lifestyle: Secondary | ICD-10-CM | POA: Diagnosis present

## 2018-03-07 DIAGNOSIS — Z7982 Long term (current) use of aspirin: Secondary | ICD-10-CM

## 2018-03-07 DIAGNOSIS — R531 Weakness: Secondary | ICD-10-CM | POA: Diagnosis present

## 2018-03-07 DIAGNOSIS — Z79899 Other long term (current) drug therapy: Secondary | ICD-10-CM

## 2018-03-07 DIAGNOSIS — F1090 Alcohol use, unspecified, uncomplicated: Secondary | ICD-10-CM | POA: Diagnosis present

## 2018-03-07 DIAGNOSIS — Y908 Blood alcohol level of 240 mg/100 ml or more: Secondary | ICD-10-CM | POA: Diagnosis present

## 2018-03-07 DIAGNOSIS — Z801 Family history of malignant neoplasm of trachea, bronchus and lung: Secondary | ICD-10-CM

## 2018-03-07 DIAGNOSIS — I472 Ventricular tachycardia, unspecified: Secondary | ICD-10-CM

## 2018-03-07 DIAGNOSIS — K7460 Unspecified cirrhosis of liver: Secondary | ICD-10-CM | POA: Diagnosis present

## 2018-03-07 DIAGNOSIS — G9341 Metabolic encephalopathy: Secondary | ICD-10-CM | POA: Diagnosis present

## 2018-03-07 DIAGNOSIS — J9811 Atelectasis: Secondary | ICD-10-CM | POA: Diagnosis present

## 2018-03-07 DIAGNOSIS — Z681 Body mass index (BMI) 19 or less, adult: Secondary | ICD-10-CM

## 2018-03-07 DIAGNOSIS — R52 Pain, unspecified: Secondary | ICD-10-CM

## 2018-03-07 DIAGNOSIS — F10931 Alcohol use, unspecified with withdrawal delirium: Secondary | ICD-10-CM | POA: Diagnosis present

## 2018-03-07 DIAGNOSIS — R1310 Dysphagia, unspecified: Secondary | ICD-10-CM | POA: Diagnosis present

## 2018-03-07 DIAGNOSIS — E876 Hypokalemia: Secondary | ICD-10-CM | POA: Diagnosis present

## 2018-03-07 DIAGNOSIS — Z789 Other specified health status: Secondary | ICD-10-CM | POA: Diagnosis present

## 2018-03-07 DIAGNOSIS — Z87891 Personal history of nicotine dependence: Secondary | ICD-10-CM

## 2018-03-07 DIAGNOSIS — R4702 Dysphasia: Secondary | ICD-10-CM | POA: Diagnosis present

## 2018-03-07 DIAGNOSIS — R5383 Other fatigue: Secondary | ICD-10-CM

## 2018-03-07 DIAGNOSIS — I480 Paroxysmal atrial fibrillation: Secondary | ICD-10-CM | POA: Diagnosis present

## 2018-03-07 DIAGNOSIS — F10220 Alcohol dependence with intoxication, uncomplicated: Secondary | ICD-10-CM | POA: Diagnosis present

## 2018-03-07 DIAGNOSIS — E861 Hypovolemia: Secondary | ICD-10-CM | POA: Diagnosis present

## 2018-03-07 DIAGNOSIS — R197 Diarrhea, unspecified: Secondary | ICD-10-CM | POA: Diagnosis present

## 2018-03-07 LAB — CBC
HCT: 39.6 % — ABNORMAL LOW (ref 40.0–52.0)
Hemoglobin: 13.9 g/dL (ref 13.0–18.0)
MCH: 33 pg (ref 26.0–34.0)
MCHC: 35 g/dL (ref 32.0–36.0)
MCV: 94.2 fL (ref 80.0–100.0)
PLATELETS: 83 10*3/uL — AB (ref 150–440)
RBC: 4.21 MIL/uL — ABNORMAL LOW (ref 4.40–5.90)
RDW: 14.4 % (ref 11.5–14.5)
WBC: 5.3 10*3/uL (ref 3.8–10.6)

## 2018-03-07 LAB — URINALYSIS, COMPLETE (UACMP) WITH MICROSCOPIC
Bacteria, UA: NONE SEEN
Bilirubin Urine: NEGATIVE
GLUCOSE, UA: NEGATIVE mg/dL
Ketones, ur: NEGATIVE mg/dL
Leukocytes, UA: NEGATIVE
NITRITE: NEGATIVE
PH: 5 (ref 5.0–8.0)
Protein, ur: NEGATIVE mg/dL
SPECIFIC GRAVITY, URINE: 1.012 (ref 1.005–1.030)
Squamous Epithelial / LPF: NONE SEEN (ref 0–5)

## 2018-03-07 LAB — COMPREHENSIVE METABOLIC PANEL
ALK PHOS: 159 U/L — AB (ref 38–126)
ALT: 44 U/L (ref 17–63)
AST: 104 U/L — AB (ref 15–41)
Albumin: 4.4 g/dL (ref 3.5–5.0)
Anion gap: 10 (ref 5–15)
BILIRUBIN TOTAL: 0.9 mg/dL (ref 0.3–1.2)
CO2: 29 mmol/L (ref 22–32)
Calcium: 8.1 mg/dL — ABNORMAL LOW (ref 8.9–10.3)
Chloride: 97 mmol/L — ABNORMAL LOW (ref 101–111)
Creatinine, Ser: 0.49 mg/dL — ABNORMAL LOW (ref 0.61–1.24)
GFR calc Af Amer: >60 mL/min (ref >60)
Glucose, Bld: 93 mg/dL (ref 65–99)
Potassium: 3.5 mmol/L (ref 3.5–5.1)
Sodium: 136 mmol/L (ref 135–145)
TOTAL PROTEIN: 7.8 g/dL (ref 6.5–8.1)

## 2018-03-07 LAB — LIPASE, BLOOD: Lipase: 62 U/L — ABNORMAL HIGH (ref 11–51)

## 2018-03-07 LAB — ETHANOL: ALCOHOL ETHYL (B): 386 mg/dL — AB (ref <10)

## 2018-03-07 MED ORDER — LORAZEPAM 2 MG/ML IJ SOLN
0.0000 mg | Freq: Four times a day (QID) | INTRAMUSCULAR | Status: DC
  Administered 2018-03-08 (×2): 2 mg via INTRAVENOUS
  Filled 2018-03-07 (×2): qty 1

## 2018-03-07 MED ORDER — LORAZEPAM 2 MG PO TABS
0.0000 mg | ORAL_TABLET | Freq: Four times a day (QID) | ORAL | Status: DC
  Administered 2018-03-07: 2 mg via ORAL
  Filled 2018-03-07: qty 1

## 2018-03-07 MED ORDER — LORAZEPAM 2 MG PO TABS: 0.0000 mg | ORAL_TABLET | Freq: Two times a day (BID) | ORAL | Status: DC

## 2018-03-07 MED ORDER — THIAMINE HCL 100 MG/ML IJ SOLN
100.0000 mg | Freq: Once | INTRAMUSCULAR | Status: AC
  Administered 2018-03-07: 100 mg via INTRAVENOUS

## 2018-03-07 MED ORDER — ONDANSETRON 4 MG PO TBDP
4.0000 mg | ORAL_TABLET | Freq: Once | ORAL | Status: AC
  Administered 2018-03-07: 4 mg via ORAL
  Filled 2018-03-07: qty 1

## 2018-03-07 MED ORDER — HYDROXYZINE HCL 25 MG PO TABS
ORAL_TABLET | ORAL | Status: AC
  Administered 2018-03-07: 50 mg
  Filled 2018-03-07: qty 2

## 2018-03-07 MED ORDER — CHLORDIAZEPOXIDE HCL 25 MG PO CAPS
ORAL_CAPSULE | ORAL | Status: AC
  Filled 2018-03-07: qty 1

## 2018-03-07 MED ORDER — SODIUM CHLORIDE 0.9 % IV BOLUS
1000.0000 mL | Freq: Once | INTRAVENOUS | Status: AC
  Administered 2018-03-07: 1000 mL via INTRAVENOUS

## 2018-03-07 MED ORDER — LORAZEPAM 2 MG/ML IJ SOLN: 0.0000 mg | Freq: Two times a day (BID) | INTRAMUSCULAR | Status: DC

## 2018-03-07 MED ORDER — THIAMINE HCL 100 MG/ML IJ SOLN
100.0000 mg | Freq: Every day | INTRAMUSCULAR | Status: DC
  Filled 2018-03-07: qty 2

## 2018-03-07 MED ORDER — CHLORDIAZEPOXIDE HCL 25 MG PO CAPS
ORAL_CAPSULE | ORAL | Status: AC
  Administered 2018-03-07: 25 mg via ORAL
  Filled 2018-03-07: qty 1

## 2018-03-07 MED ORDER — VITAMIN B-1 100 MG PO TABS: 100.0000 mg | ORAL_TABLET | Freq: Every day | ORAL | Status: DC

## 2018-03-07 MED ORDER — CHLORDIAZEPOXIDE HCL 25 MG PO CAPS
25.0000 mg | ORAL_CAPSULE | Freq: Three times a day (TID) | ORAL | Status: DC
  Administered 2018-03-07: 25 mg via ORAL
  Filled 2018-03-07: qty 1

## 2018-03-07 MED ORDER — CHLORDIAZEPOXIDE HCL 25 MG PO CAPS: 50.0000 mg | ORAL_CAPSULE | Freq: Three times a day (TID) | ORAL | Status: DC

## 2018-03-07 MED ORDER — LORAZEPAM 1 MG PO TABS
1.0000 mg | ORAL_TABLET | Freq: Once | ORAL | Status: AC
  Administered 2018-03-07: 1 mg via ORAL
  Filled 2018-03-07: qty 1

## 2018-03-07 MED ORDER — CHLORDIAZEPOXIDE HCL 25 MG PO CAPS
25.0000 mg | ORAL_CAPSULE | Freq: Three times a day (TID) | ORAL | Status: DC
  Administered 2018-03-09 – 2018-03-13 (×8): 25 mg via ORAL
  Filled 2018-03-07 (×11): qty 1

## 2018-03-07 MED ORDER — VITAMIN B-1 100 MG PO TABS
500.0000 mg | ORAL_TABLET | Freq: Once | ORAL | Status: AC
  Administered 2018-03-07: 500 mg via ORAL
  Filled 2018-03-07: qty 5

## 2018-03-07 MED ORDER — CHLORDIAZEPOXIDE HCL 25 MG PO CAPS
50.0000 mg | ORAL_CAPSULE | Freq: Three times a day (TID) | ORAL | Status: AC
  Administered 2018-03-07 (×2): 50 mg via ORAL
  Filled 2018-03-07: qty 2

## 2018-03-07 NOTE — ED Notes (Signed)
This RN to bedside at this time, pt's O2 sat noted to be 87% on RA. When patient awakened, O2 sats come up and patient is alert and able to answer questions. Pt placed on 2L O2 via [place], O2 sats 98% on 2L.

## 2018-03-07 NOTE — ED Notes (Signed)
Attempt to get pt to his feet unsuccessful. Pt is shaking and trembling and unable to grab the walker with the left hand. Told pt that I cant have him go home not being able to walk and feeling bad due to etoh detox. Pt has agreed to stay.

## 2018-03-07 NOTE — ED Notes (Signed)
Pt repositioned in bed, sat up and given meal tray at this time. Pt states "I can't go like this". This RN discussed plan of care with pt and daughter. Pt states he can't go to a facility with the weakness he is experiencing. Will continue to monitor for further patient needs.

## 2018-03-07 NOTE — ED Notes (Signed)
Pt moved from room 6 to room 20. Here for detox from alcohol. Pt is unable to ambulate per report and is under ciwa at this time.

## 2018-03-07 NOTE — ED Notes (Signed)
Attempted to get patient to toilet in room and patient unable to stand for this RN. Pt placed on bedpan

## 2018-03-07 NOTE — BH Assessment (Signed)
Patient's referral information faxed to:    [name] [address] [place] [address] [phone]  . [facility]   [address] [place] [address]  [phone]  . [facility] [address]   [address] [place] [address]  [phone]  . [place]  [address] [place] [address]  [phone]  . [facility]  [address], [place] [address]  [phone]

## 2018-03-07 NOTE — ED Provider Notes (Signed)
[name] requesting to leave, I will involuntarily commit him.  He has severe likely end-stage alcohol abuse.   Earleen Newport, MD 03/07/18 2203

## 2018-03-07 NOTE — ED Notes (Signed)
TTS at bedside at this time.
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
## 2018-03-07 NOTE — ED Notes (Signed)
Pt assisted to the toilet by Edison Nasuti, EDT. Now awaiting consult from TTS.

## 2018-03-07 NOTE — ED Notes (Signed)
PT IVC'D BY MD [name]/RN SHERIE AND ODS SECURITY MADE AWARE.

## 2018-03-07 NOTE — ED Notes (Signed)
Pt taken off O2 for trial off of oxygen.

## 2018-03-07 NOTE — BH Assessment (Signed)
This Probation officer received call from Culver at [facility] and stating patient will be accepted at [facility] tomorrow once Business Dept verifies Medicare coverage.

## 2018-03-07 NOTE — ED Notes (Signed)
Pt is asking to leave. Pt states he thinks he can detox on his own. Pt is not ambulatory. Pt states he feels better. MD notified.

## 2018-03-07 NOTE — ED Notes (Signed)
Spoke with pt's daughter on the phone (at his request), she lives in [place]; she says pt has expressed wanting help with detox but he's between levels of care; says he's too frail for a detox facility and not frail enough to be admitted; encouraged daughter to call back in about 2 hours to see if pt is in a treatment room to speak with his primary care nurse and possibly the MD; phone number given to pt's daughter

## 2018-03-07 NOTE — ED Notes (Signed)
Pt refusing to go to detox facility, [place], TTS aware, MD aware, pt's daughter aware. [name] daughter became angry and left, noted to be tearful. This RN notified by Nicki Reaper, EDT that patient was complaining of nausea. Will discuss patient's care with social worker.

## 2018-03-07 NOTE — ED Triage Notes (Signed)
Patient to triage via wheelchair by EMS.  Patient to ED due to having diarrhea and weakness.  Also reports he has been binge drinking.  EMS vital signs:  HR - 73,  BP - 183/91; pulse oxi 97% on room air, CBG - 96, saline loc to left antecub via 18G angiocath.
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
## 2018-03-07 NOTE — ED Notes (Signed)
Pt and daughter given update at this time regarding plan of care. Pt and daughter state understanding, however pt states he feels like he should detox here in the hospital. Pt states he was sent home with a prescription for librium and for nausea but he did not begin taking them because he was still drinking.

## 2018-03-07 NOTE — ED Notes (Signed)
Patient transported to X-ray 

## 2018-03-07 NOTE — Clinical Social Work Note (Signed)
CSW received consult for "Social work." CSW staffed with [employer] and EDP Dr. Quentin Cornwall. Patient from home alone. CSW met with patient at bedside with Dr. Quentin Cornwall. Patient accepted to [facility] for rehab,but stating he back go because he can't walk. CSW asked if patient can't walk then how would he go home alone and patient stated he would be able to "navigate his home." CSW explained that [facility] unable to accept patient until tomorrow and patient will not discharge today. CSW explained that it is not safe for patient to discharge home and if he decides to do that [place] would have to make an Adult Protective Services report. CSW encouraged patient to reconsider [facility] placement. Patient agreeable to reconsider. CSW to see patient on Monday. EDP Dr. Quentin Cornwall and [facility] both aware. CSW continuing to follow for discharge needs.   Oretha Ellis, Latanya Presser, [place] Social Worker-ED [phone]
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
## 2018-03-07 NOTE — ED Notes (Signed)
This RN and Nicki Reaper, EDT to bedside, attempted to stand patient without success. Pt laying in bed without tremors, when attempting to stand patient with noted severe tremors, pt states "I can't do it just lay me back down". THis RN and Peyton Bottoms, TTS and [place], EDT at bedside, pt states "I'm too sick to go anywhere, I need to stay here." This RN explained that if patient was too sick to go to detox facility, then he was too sick to go home and as discussed with Dr. Quentin Cornwall patient did not meet criteria for admission. Upon laying patient back down, pt noted have tremors that subsided and also noted to have a HR of 148. MD made aware of attempt to stand and ambulate patient being unsuccessful. VORB for 1L NS and consult to social work.

## 2018-03-07 NOTE — BH Assessment (Signed)
This Probation officer spoke with [facility] rep and he stated medical team is reviewing patient's referral and will call with an update.

## 2018-03-07 NOTE — ED Notes (Signed)
Dr Dahlia Client notified of pt's elevated ETOH of 386; acknowledged

## 2018-03-07 NOTE — BH Assessment (Addendum)
This Probation officer received call from [place] at [facility] stating patients Medicare number [id number] wasn't able to be verified. Writer called patient registration and spoke with Delrae Rend and she confirmed [id number] was E-verified. Otila Kluver stated [facility] is at capacity and patient will be placed on waitlist until tomorrow.  Faxed patient's Medicare card to [phone] per Tina's request.

## 2018-03-08 DIAGNOSIS — R197 Diarrhea, unspecified: Secondary | ICD-10-CM | POA: Diagnosis present

## 2018-03-08 DIAGNOSIS — Z8249 Family history of ischemic heart disease and other diseases of the circulatory system: Secondary | ICD-10-CM | POA: Diagnosis not present

## 2018-03-08 DIAGNOSIS — Z87891 Personal history of nicotine dependence: Secondary | ICD-10-CM | POA: Diagnosis not present

## 2018-03-08 DIAGNOSIS — F10220 Alcohol dependence with intoxication, uncomplicated: Secondary | ICD-10-CM | POA: Diagnosis present

## 2018-03-08 DIAGNOSIS — R1310 Dysphagia, unspecified: Secondary | ICD-10-CM | POA: Diagnosis present

## 2018-03-08 DIAGNOSIS — R531 Weakness: Secondary | ICD-10-CM | POA: Diagnosis present

## 2018-03-08 DIAGNOSIS — I480 Paroxysmal atrial fibrillation: Secondary | ICD-10-CM | POA: Diagnosis present

## 2018-03-08 DIAGNOSIS — F10231 Alcohol dependence with withdrawal delirium: Secondary | ICD-10-CM | POA: Diagnosis present

## 2018-03-08 DIAGNOSIS — E878 Other disorders of electrolyte and fluid balance, not elsewhere classified: Secondary | ICD-10-CM | POA: Diagnosis present

## 2018-03-08 DIAGNOSIS — Z681 Body mass index (BMI) 19 or less, adult: Secondary | ICD-10-CM | POA: Diagnosis not present

## 2018-03-08 DIAGNOSIS — Z7982 Long term (current) use of aspirin: Secondary | ICD-10-CM | POA: Diagnosis not present

## 2018-03-08 DIAGNOSIS — Z66 Do not resuscitate: Secondary | ICD-10-CM | POA: Diagnosis present

## 2018-03-08 DIAGNOSIS — E876 Hypokalemia: Secondary | ICD-10-CM | POA: Diagnosis present

## 2018-03-08 DIAGNOSIS — Z801 Family history of malignant neoplasm of trachea, bronchus and lung: Secondary | ICD-10-CM | POA: Diagnosis not present

## 2018-03-08 DIAGNOSIS — Y908 Blood alcohol level of 240 mg/100 ml or more: Secondary | ICD-10-CM | POA: Diagnosis present

## 2018-03-08 DIAGNOSIS — E871 Hypo-osmolality and hyponatremia: Secondary | ICD-10-CM | POA: Diagnosis present

## 2018-03-08 DIAGNOSIS — Z79899 Other long term (current) drug therapy: Secondary | ICD-10-CM | POA: Diagnosis not present

## 2018-03-08 DIAGNOSIS — G9340 Encephalopathy, unspecified: Secondary | ICD-10-CM

## 2018-03-08 DIAGNOSIS — J9811 Atelectasis: Secondary | ICD-10-CM | POA: Diagnosis present

## 2018-03-08 DIAGNOSIS — I472 Ventricular tachycardia: Secondary | ICD-10-CM | POA: Diagnosis present

## 2018-03-08 DIAGNOSIS — Y909 Presence of alcohol in blood, level not specified: Secondary | ICD-10-CM | POA: Diagnosis not present

## 2018-03-08 DIAGNOSIS — R4702 Dysphasia: Secondary | ICD-10-CM | POA: Diagnosis present

## 2018-03-08 DIAGNOSIS — I10 Essential (primary) hypertension: Secondary | ICD-10-CM | POA: Diagnosis present

## 2018-03-08 DIAGNOSIS — F10931 Alcohol use, unspecified with withdrawal delirium: Secondary | ICD-10-CM | POA: Diagnosis present

## 2018-03-08 DIAGNOSIS — G9341 Metabolic encephalopathy: Secondary | ICD-10-CM | POA: Diagnosis present

## 2018-03-08 DIAGNOSIS — K7460 Unspecified cirrhosis of liver: Secondary | ICD-10-CM | POA: Diagnosis not present

## 2018-03-08 DIAGNOSIS — F1027 Alcohol dependence with alcohol-induced persisting dementia: Secondary | ICD-10-CM | POA: Diagnosis present

## 2018-03-08 DIAGNOSIS — E440 Moderate protein-calorie malnutrition: Secondary | ICD-10-CM | POA: Diagnosis present

## 2018-03-08 DIAGNOSIS — E861 Hypovolemia: Secondary | ICD-10-CM | POA: Diagnosis present

## 2018-03-08 LAB — BASIC METABOLIC PANEL
Anion gap: 9 (ref 5–15)
BUN: 5 mg/dL — ABNORMAL LOW (ref 6–20)
CALCIUM: 8.2 mg/dL — AB (ref 8.9–10.3)
CO2: 29 mmol/L (ref 22–32)
CREATININE: 0.44 mg/dL — AB (ref 0.61–1.24)
Chloride: 98 mmol/L — ABNORMAL LOW (ref 101–111)
GLUCOSE: 166 mg/dL — AB (ref 65–99)
Potassium: 2.9 mmol/L — ABNORMAL LOW (ref 3.5–5.1)
Sodium: 136 mmol/L (ref 135–145)

## 2018-03-08 LAB — OSMOLALITY: OSMOLALITY: 282 mosm/kg (ref 275–295)

## 2018-03-08 LAB — URINE DRUG SCREEN, QUALITATIVE (ARMC ONLY)
AMPHETAMINES, UR SCREEN: NOT DETECTED
BENZODIAZEPINE, UR SCRN: POSITIVE — AB
Barbiturates, Ur Screen: NOT DETECTED
Cannabinoid 50 Ng, Ur ~~LOC~~: NOT DETECTED
Cocaine Metabolite,Ur ~~LOC~~: NOT DETECTED
MDMA (ECSTASY) UR SCREEN: NOT DETECTED
METHADONE SCREEN, URINE: NOT DETECTED
Opiate, Ur Screen: NOT DETECTED
PHENCYCLIDINE (PCP) UR S: NOT DETECTED
TRICYCLIC, UR SCREEN: NOT DETECTED

## 2018-03-08 LAB — ACETAMINOPHEN LEVEL: Acetaminophen (Tylenol), Serum: <10 ug/mL — ABNORMAL LOW (ref 10–30)

## 2018-03-08 LAB — PHOSPHORUS: Phosphorus: 2.3 mg/dL — ABNORMAL LOW (ref 2.5–4.6)

## 2018-03-08 LAB — GLUCOSE, CAPILLARY: Glucose-Capillary: 90 mg/dL (ref 65–99)

## 2018-03-08 LAB — MAGNESIUM: Magnesium: 1.3 mg/dL — ABNORMAL LOW (ref 1.7–2.4)

## 2018-03-08 LAB — MRSA PCR SCREENING: MRSA BY PCR: NEGATIVE

## 2018-03-08 LAB — TSH: TSH: 1.924 u[IU]/mL (ref 0.350–4.500)

## 2018-03-08 LAB — POTASSIUM: POTASSIUM: 3.8 mmol/L (ref 3.5–5.1)

## 2018-03-08 LAB — SALICYLATE LEVEL

## 2018-03-08 MED ORDER — POTASSIUM CHLORIDE 10 MEQ/100ML IV SOLN
10.0000 meq | Freq: Once | INTRAVENOUS | Status: AC
  Administered 2018-03-08: 10 meq via INTRAVENOUS
  Filled 2018-03-08: qty 100

## 2018-03-08 MED ORDER — ATENOLOL 25 MG PO TABS
25.0000 mg | ORAL_TABLET | Freq: Every day | ORAL | Status: DC
  Administered 2018-03-09 – 2018-03-15 (×4): 25 mg via ORAL
  Filled 2018-03-08 (×12): qty 1

## 2018-03-08 MED ORDER — DOCUSATE SODIUM 100 MG PO CAPS: 100.0000 mg | ORAL_CAPSULE | Freq: Two times a day (BID) | ORAL | Status: DC | PRN

## 2018-03-08 MED ORDER — ENSURE ENLIVE PO LIQD: 237.0000 mL | Freq: Two times a day (BID) | ORAL | Status: DC

## 2018-03-08 MED ORDER — ACETAMINOPHEN 650 MG RE SUPP
650.0000 mg | Freq: Four times a day (QID) | RECTAL | Status: DC | PRN
  Filled 2018-03-08: qty 1

## 2018-03-08 MED ORDER — ORAL CARE MOUTH RINSE
15.0000 mL | Freq: Two times a day (BID) | OROMUCOSAL | Status: DC
  Administered 2018-03-08 – 2018-03-15 (×10): 15 mL via OROMUCOSAL

## 2018-03-08 MED ORDER — MAGNESIUM SULFATE 2 GM/50ML IV SOLN
INTRAVENOUS | Status: AC
  Filled 2018-03-08: qty 50

## 2018-03-08 MED ORDER — POTASSIUM CHLORIDE 10 MEQ/100ML IV SOLN
10.0000 meq | INTRAVENOUS | Status: AC
  Administered 2018-03-08 (×3): 10 meq via INTRAVENOUS
  Filled 2018-03-08 (×3): qty 100

## 2018-03-08 MED ORDER — ENOXAPARIN SODIUM 40 MG/0.4ML ~~LOC~~ SOLN
40.0000 mg | SUBCUTANEOUS | Status: DC
  Administered 2018-03-08 – 2018-03-10 (×3): 40 mg via SUBCUTANEOUS
  Filled 2018-03-08 (×3): qty 0.4

## 2018-03-08 MED ORDER — ONDANSETRON HCL 4 MG/2ML IJ SOLN
4.0000 mg | Freq: Four times a day (QID) | INTRAMUSCULAR | Status: DC | PRN
Start: 2018-03-08 — End: 2018-03-16
  Administered 2018-03-14: 4 mg via INTRAVENOUS
  Filled 2018-03-08: qty 2

## 2018-03-08 MED ORDER — AMIODARONE HCL IN DEXTROSE 360-4.14 MG/200ML-% IV SOLN
30.0000 mg/h | INTRAVENOUS | Status: DC
  Administered 2018-03-08: 30 mg/h via INTRAVENOUS
  Filled 2018-03-08: qty 200

## 2018-03-08 MED ORDER — FOLIC ACID 1 MG PO TABS: 1.0000 mg | ORAL_TABLET | Freq: Every day | ORAL | Status: DC

## 2018-03-08 MED ORDER — AMIODARONE LOAD VIA INFUSION
150.0000 mg | Freq: Once | INTRAVENOUS | Status: AC
  Administered 2018-03-08: 150 mg via INTRAVENOUS
  Filled 2018-03-08: qty 83.34

## 2018-03-08 MED ORDER — MAGNESIUM SULFATE 2 GM/50ML IV SOLN
2.0000 g | Freq: Once | INTRAVENOUS | Status: AC
  Administered 2018-03-08: 2 g via INTRAVENOUS
  Filled 2018-03-08: qty 50

## 2018-03-08 MED ORDER — ASPIRIN EC 81 MG PO TBEC
81.0000 mg | DELAYED_RELEASE_TABLET | Freq: Every day | ORAL | Status: DC
  Administered 2018-03-09 – 2018-03-16 (×6): 81 mg via ORAL
  Filled 2018-03-08 (×7): qty 1

## 2018-03-08 MED ORDER — AMIODARONE IV BOLUS ONLY 150 MG/100ML
INTRAVENOUS | Status: AC
  Filled 2018-03-08: qty 100

## 2018-03-08 MED ORDER — POLYETHYLENE GLYCOL 3350 17 G PO PACK
17.0000 g | PACK | Freq: Every day | ORAL | Status: DC
  Administered 2018-03-12 – 2018-03-16 (×5): 17 g via ORAL
  Filled 2018-03-08 (×6): qty 1

## 2018-03-08 MED ORDER — ONDANSETRON HCL 4 MG PO TABS: 4.0000 mg | ORAL_TABLET | Freq: Four times a day (QID) | ORAL | Status: DC | PRN

## 2018-03-08 MED ORDER — THIAMINE HCL 100 MG/ML IJ SOLN
Freq: Once | INTRAVENOUS | Status: AC
  Administered 2018-03-08: 03:00:00 via INTRAVENOUS
  Filled 2018-03-08: qty 1000

## 2018-03-08 MED ORDER — HYDRALAZINE HCL 20 MG/ML IJ SOLN
10.0000 mg | INTRAMUSCULAR | Status: DC | PRN
  Administered 2018-03-08: 20 mg via INTRAVENOUS
  Administered 2018-03-10: 10 mg via INTRAVENOUS
  Administered 2018-03-11 – 2018-03-12 (×2): 20 mg via INTRAVENOUS
  Filled 2018-03-08 (×4): qty 1

## 2018-03-08 MED ORDER — HALOPERIDOL LACTATE 5 MG/ML IJ SOLN
5.0000 mg | Freq: Once | INTRAMUSCULAR | Status: AC
Start: 2018-03-08 — End: 2018-03-08
  Administered 2018-03-08: 5 mg via INTRAVENOUS

## 2018-03-08 MED ORDER — THIAMINE HCL 100 MG/ML IJ SOLN
Freq: Every day | INTRAVENOUS | Status: AC
  Administered 2018-03-08 – 2018-03-09 (×3): via INTRAVENOUS
  Filled 2018-03-08 (×3): qty 1000

## 2018-03-08 MED ORDER — BACLOFEN 10 MG PO TABS
10.0000 mg | ORAL_TABLET | Freq: Three times a day (TID) | ORAL | Status: DC
  Administered 2018-03-09 – 2018-03-13 (×8): 10 mg via ORAL
  Filled 2018-03-08 (×20): qty 1

## 2018-03-08 MED ORDER — ADULT MULTIVITAMIN W/MINERALS CH: 1.0000 | ORAL_TABLET | Freq: Every day | ORAL | Status: DC

## 2018-03-08 MED ORDER — AMIODARONE HCL IN DEXTROSE 360-4.14 MG/200ML-% IV SOLN
60.0000 mg/h | INTRAVENOUS | Status: AC
  Administered 2018-03-08: 60 mg/h via INTRAVENOUS
  Filled 2018-03-08: qty 200

## 2018-03-08 MED ORDER — FENTANYL CITRATE (PF) 100 MCG/2ML IJ SOLN
50.0000 ug | Freq: Once | INTRAMUSCULAR | Status: AC
  Administered 2018-03-08: 50 ug via INTRAVENOUS
  Filled 2018-03-08 (×2): qty 2

## 2018-03-08 MED ORDER — LORAZEPAM 2 MG/ML IJ SOLN
2.0000 mg | INTRAMUSCULAR | Status: DC | PRN
Start: 2018-03-08 — End: 2018-03-09
  Administered 2018-03-08 (×3): 2 mg via INTRAVENOUS
  Administered 2018-03-08: 3 mg via INTRAVENOUS
  Administered 2018-03-08 (×2): 2 mg via INTRAVENOUS
  Administered 2018-03-08: 3 mg via INTRAVENOUS
  Administered 2018-03-09: 2 mg via INTRAVENOUS
  Administered 2018-03-09: 3 mg via INTRAVENOUS
  Administered 2018-03-09: 2 mg via INTRAVENOUS
  Filled 2018-03-08 (×5): qty 1
  Filled 2018-03-08: qty 2
  Filled 2018-03-08 (×5): qty 1
  Filled 2018-03-08: qty 2

## 2018-03-08 MED ORDER — LISINOPRIL 20 MG PO TABS
20.0000 mg | ORAL_TABLET | Freq: Every day | ORAL | Status: DC
  Administered 2018-03-09 – 2018-03-16 (×6): 20 mg via ORAL
  Filled 2018-03-08 (×8): qty 1

## 2018-03-08 MED ORDER — ACETAMINOPHEN 325 MG PO TABS
650.0000 mg | ORAL_TABLET | Freq: Four times a day (QID) | ORAL | Status: DC | PRN
  Administered 2018-03-14 – 2018-03-16 (×3): 650 mg via ORAL
  Filled 2018-03-08 (×3): qty 2

## 2018-03-08 MED ORDER — SODIUM CHLORIDE 0.9 % IV SOLN: INTRAVENOUS | Status: DC

## 2018-03-08 MED ORDER — HALOPERIDOL LACTATE 5 MG/ML IJ SOLN
INTRAMUSCULAR | Status: AC
  Filled 2018-03-08: qty 1

## 2018-03-08 NOTE — ED Notes (Signed)
Pt is calm and resting quietly. No tremors noted. Respiratory rate is adequate. Pt placed on 2L of O2 via nasal cannula.

## 2018-03-08 NOTE — ED Provider Notes (Signed)
-----------------------------------------   1:00 AM on 03/08/2018 -----------------------------------------  At 11 PM, patient has become increasingly altered, attempting to get out of the bed.  Shaking and trembling have worsened despite boluses of IV Ativan.  Oral hydroxyzine was given for sleep without effect.  IV Haldol was given without effect.  Now patient is hallucinating, shaking both arms; afebrile, hypertensive.  I believe he is going into full-blown DTs.  Will discuss with hospitalist to evaluate patient in the emergency department for admission.   ----------------------------------------- 1:26 AM on 03/08/2018 -----------------------------------------  Patient looks to be having runs of nonsustained ventricular tachycardia.  Will start amiodarone 150 mg bolus with infusion.  Pacer pads placed.  Will recheck electrolytes.   ED ECG REPORT I, SUNG,JADE J, the attending physician, personally viewed and interpreted this ECG.   Date: 03/08/2018  EKG Time: 0145  Rate: 126  Rhythm: sinus tachycardia  Axis: Normal  Intervals:none  ST&T Change: Nonspecific   ----------------------------------------- 2:27 AM on 03/08/2018 -----------------------------------------  Patient continues to be tremulous despite several boluses of IV Ativan.  Repeat electrolytes noted.  Will administer IV magnesium, 2 rounds of IV potassium.  IV fluids going.  Will administer banana bag.   CRITICAL CARE Performed by: Paulette Blanch   Total critical care time: 45 minutes  Critical care time was exclusive of separately billable procedures and treating other patients.  Critical care was necessary to treat or prevent imminent or life-threatening deterioration.  Critical care was time spent personally by me on the following activities: development of treatment plan with patient and/or surrogate as well as nursing, discussions with consultants, evaluation of patient's response to treatment, examination of  patient, obtaining history from patient or surrogate, ordering and performing treatments and interventions, ordering and review of laboratory studies, ordering and review of radiographic studies, pulse oximetry and re-evaluation of patient's condition.    Paulette Blanch, MD 03/08/18 [phone]

## 2018-03-08 NOTE — ED Notes (Signed)
Pt continues to have severe tremors and is disoriented. Pt is sweating profusely and both IVs are retaped and secured. Pt is now on defib pads and seizure pads have been applied to the bed. No improvements with medications thus far.

## 2018-03-08 NOTE — ED Notes (Signed)
Monitor ready sinu tach. Pt having intermittent tremors. Herat rate varies from 60s to 110.
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
## 2018-03-08 NOTE — Progress Notes (Signed)
Pharmacy Electrolyte Monitoring Consult:  Pharmacy consulted to assist in monitoring and replacing electrolytes in this 67 y.o. male admitted on 03/07/2018 with alcohol withdrawal.   Patient received magnesium 2g IV x 1 and potassium 51mq IV x 2 in ED.   Labs:  Sodium (mmol/L)  Date Value  03/08/2018 136   Potassium (mmol/L)  Date Value  03/08/2018 2.9 (L)   Magnesium (mg/dL)  Date Value  03/08/2018 1.3 (L)   Phosphorus (mg/dL)  Date Value  03/08/2018 2.3 (L)   Calcium (mg/dL)  Date Value  03/08/2018 8.2 (L)   Albumin (g/dL)  Date Value  03/07/2018 4.4    Plan: Per ICU rounds will initiate Banana Bag x 3 days.   Will order additional magnesium 2g IV X 1 and potassium 157m IV Q1hr x 3 doses.   Will recheck potassium at 1500.   Pharmacy will continue to monitor and adjust per consult.   , L 03/08/2018 10:55 AM

## 2018-03-08 NOTE — Significant Event (Signed)
Pt admitted to ICU-3 from ED. Report received via phone from [place], Therapist, sports. Pt hooked up to monitor, VSS. On 2L [place] with amio and NS gtts. Electrolytes and banana bag ordered from pharmacy. PRN ativan ordered as needed. Upon arrival, no tremors or fever present. CHG bath completed. Per Dr.Conforti, keep patient NPO at this time including PO meds, as patient is not alert enough to safely swallow anything. See flowsheets and MAR for more details. Will continue to monitor.

## 2018-03-08 NOTE — Progress Notes (Signed)
Patient seen and evaluated today in the emergency room Admitted this a.m. by hospitalist Dr Marcille Blanco History and physical exam and management plan reviewed from Dr. Marcille Blanco Patient admitted for alcohol withdrawal and atrial fibrillation with rapid rate to stepdown unit Currently on IV amiodarone drip Patient received IV Ativan in the emergency room and was lethargic and sleepy Monitor electrolytes Replace potassium and magnesium as needed Intensivist follow-up

## 2018-03-08 NOTE — ED Notes (Signed)
Pt taken up with Juliane Lack, RN at this time, VSS. PT given ativan piror to trasnport due to pt arousing, tachycardia , and restless. PT transported with sitter and ODS officer at this time

## 2018-03-08 NOTE — ED Notes (Signed)
ENVIRONMENTAL ASSESSMENT  Potentially harmful objects out of patient reach: Yes.  Personal belongings secured: Yes.  Patient dressed in hospital provided attire only: Yes.  Plastic bags out of patient reach: Yes.  Patient care equipment (cords, cables, call bells, lines, and drains) shortened, removed, or accounted for: Yes.  Equipment and supplies removed from bottom of stretcher: Yes.  Potentially toxic materials out of patient reach: Yes.  Sharps container removed or out of patient reach: Yes.   BEHAVIORAL HEALTH ROUNDING Patient sleeping: Yes.  But very restless, and tremors Patient alert and oriented: not applicable SLEEPING Behavior appropriate: Yes.  ; If no, describe: SLEEPING Nutrition and fluids offered: No SLEEPING Toileting and hygiene offered: NoSLEEPING Sitter present: not applicable, Q 15 min safety rounds and observation. Law enforcement present: Yes ODS

## 2018-03-08 NOTE — ED Notes (Signed)
Pt attempting to get out of bed again. Pt says he is trying to go get a bottle of wine.
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
## 2018-03-08 NOTE — Progress Notes (Signed)
Spoke with Hinton Dyer, NP about upward trend of BP.  Currently NPO.  New orders received.

## 2018-03-08 NOTE — ED Notes (Signed)
Dr Beather Arbour called to the bedside. Pt is having severe intermittent tremors. Pt is disoriented. Heart rate is tachy. Pt has declined. New order for 16m Ativan received. Pt is to be admitted medically.

## 2018-03-08 NOTE — Progress Notes (Signed)
Pharmacy Electrolyte Monitoring Consult:  Pharmacy consulted to assist in monitoring and replacing electrolytes in this 67 y.o. male admitted on 03/07/2018 with alcohol withdrawal.   Patient received magnesium 2g IV x 1 and potassium 49mq IV x 2 in ED and additional magnesium 2g IV x 1 and potassium 136m IV x 3 in the ICU.   [name] receiving Banana Bag x 3 days.    Labs:  Sodium (mmol/L)  Date Value  03/08/2018 136   Potassium (mmol/L)  Date Value  03/08/2018 3.8   Magnesium (mg/dL)  Date Value  03/08/2018 1.3 (L)   Phosphorus (mg/dL)  Date Value  03/08/2018 2.3 (L)   Calcium (mg/dL)  Date Value  03/08/2018 8.2 (L)   Albumin (g/dL)  Date Value  03/07/2018 4.4    Plan: No further replacement warranted. Will obtain follow up electrolytes with am labs.   Pharmacy will continue to monitor and adjust per consult.   Simpson,Michael L 03/08/2018 3:56 PM

## 2018-03-08 NOTE — BH Assessment (Signed)
This Probation officer spoke with patients ICU Nurse and sister [name] in regards to patients medical status. Patient has gotten progressively worse and is no longer medically cleared for detox/psych treatment.   This Probation officer followed up with [facility] intake department to inform patient isn't medically cleared for transport/detox treatment.

## 2018-03-09 LAB — BASIC METABOLIC PANEL
Anion gap: 9 (ref 5–15)
CALCIUM: 8.1 mg/dL — AB (ref 8.9–10.3)
CHLORIDE: 102 mmol/L (ref 101–111)
CO2: 26 mmol/L (ref 22–32)
CREATININE: 0.55 mg/dL — AB (ref 0.61–1.24)
GFR calc non Af Amer: >60 mL/min (ref >60)
Glucose, Bld: 80 mg/dL (ref 65–99)
Potassium: 3.4 mmol/L — ABNORMAL LOW (ref 3.5–5.1)
Sodium: 137 mmol/L (ref 135–145)

## 2018-03-09 LAB — MAGNESIUM: Magnesium: 1.7 mg/dL (ref 1.7–2.4)

## 2018-03-09 LAB — PHOSPHORUS: Phosphorus: 2.7 mg/dL (ref 2.5–4.6)

## 2018-03-09 LAB — AMMONIA: Ammonia: 23 umol/L (ref 9–35)

## 2018-03-09 MED ORDER — MAGNESIUM SULFATE 2 GM/50ML IV SOLN
2.0000 g | Freq: Once | INTRAVENOUS | Status: AC
  Administered 2018-03-09: 2 g via INTRAVENOUS
  Filled 2018-03-09: qty 50

## 2018-03-09 MED ORDER — POTASSIUM CHLORIDE 10 MEQ/100ML IV SOLN
10.0000 meq | INTRAVENOUS | Status: AC
  Administered 2018-03-09 (×3): 10 meq via INTRAVENOUS
  Filled 2018-03-09 (×3): qty 100

## 2018-03-09 MED ORDER — FOLIC ACID 1 MG PO TABS: 1.0000 mg | ORAL_TABLET | Freq: Every day | ORAL | Status: DC

## 2018-03-09 MED ORDER — LORAZEPAM 2 MG/ML IJ SOLN
1.0000 mg | Freq: Four times a day (QID) | INTRAMUSCULAR | Status: AC | PRN
  Administered 2018-03-09 – 2018-03-12 (×5): 1 mg via INTRAVENOUS
  Filled 2018-03-09 (×5): qty 1

## 2018-03-09 MED ORDER — LORAZEPAM 1 MG PO TABS
1.0000 mg | ORAL_TABLET | Freq: Four times a day (QID) | ORAL | Status: AC | PRN
  Administered 2018-03-10: 1 mg via ORAL
  Filled 2018-03-09: qty 1

## 2018-03-09 MED ORDER — LACTATED RINGERS IV SOLN
INTRAVENOUS | Status: DC
  Administered 2018-03-09 – 2018-03-10 (×2): via INTRAVENOUS

## 2018-03-09 MED ORDER — THIAMINE HCL 100 MG/ML IJ SOLN: 100.0000 mg | Freq: Every day | INTRAMUSCULAR | Status: DC

## 2018-03-09 MED ORDER — VITAMIN B-1 100 MG PO TABS: 100.0000 mg | ORAL_TABLET | Freq: Every day | ORAL | Status: DC

## 2018-03-09 MED ORDER — ADULT MULTIVITAMIN W/MINERALS CH
1.0000 | ORAL_TABLET | Freq: Every day | ORAL | Status: DC
  Administered 2018-03-12 – 2018-03-16 (×5): 1 via ORAL
  Filled 2018-03-09 (×6): qty 1

## 2018-03-09 NOTE — Progress Notes (Signed)
Report called to Lovena Le, RN on 2A.  Patient will be moved to room 252.  Patient has been alert to self only this shift.  Agitated at times with tremors.  Treated once with PRN ativan per CIWA scale.  Sitter at bedside and sister visited several times today.
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
## 2018-03-09 NOTE — Progress Notes (Signed)
Patient moved to room 252 by bed with Hoyle Sauer, NT and [place], NT.

## 2018-03-09 NOTE — Progress Notes (Signed)
Pharmacy Electrolyte Monitoring Consult:  Pharmacy consulted to assist in monitoring and replacing electrolytes in this 67 y.o. male admitted on 03/07/2018 with alcohol withdrawal.    Labs:  Sodium (mmol/L)  Date Value  03/09/2018 137   Potassium (mmol/L)  Date Value  03/09/2018 3.4 (L)   Magnesium (mg/dL)  Date Value  03/09/2018 1.7   Phosphorus (mg/dL)  Date Value  03/09/2018 2.7   Calcium (mg/dL)  Date Value  03/09/2018 8.1 (L)   Albumin (g/dL)  Date Value  03/07/2018 4.4    Plan: Initiated Magnesium 2g IV x1, Potassium 10 mEq IV x3. Will obtain follow up electrolytes with am labs.   Pharmacy will continue to monitor and adjust per consult.   Ricardo Jericho 03/09/2018 1:53 PM

## 2018-03-09 NOTE — Progress Notes (Signed)
Patient arrived from CCU. Sleeping. Sitter at bedside. No distress noted.

## 2018-03-09 NOTE — Progress Notes (Signed)
Follow up - Critical Care Medicine Note  Patient Details:    Douglas Peterson is an 67 y.o. male.with a history of alcohol abuse, hypertension, hyponatremia, presented with a complaint of diarrhea. Patient has a history of chronic alcohol abuse, on admission his alcohol level was greater than 300, developed altered mental status in emergency department along with atrial fibrillation with rapid ventricular response. He was started on amiodarone drip and was transferred to the intensive care unit for close monitoring of his neurologic status.  Lines, Airways, Drains: External Urinary Catheter (Active)  Collection Container Standard drainage bag 03/09/2018  1:00 AM  Intervention Equipment Changed 03/08/2018  9:40 AM  Output (mL) 0 mL 03/08/2018  9:40 AM    Anti-infectives:  Anti-infectives (From admission, onward)   None      Microbiology: Results for orders placed or performed during the hospital encounter of 03/07/18  MRSA PCR Screening     Status: None   Collection Time: 03/08/18 12:42 PM  Result Value Ref Range Status   MRSA by PCR NEGATIVE NEGATIVE Final    Comment:        The GeneXpert MRSA Assay (FDA approved for NASAL specimens only), is one component of a comprehensive MRSA colonization surveillance program. It is not intended to diagnose MRSA infection nor to guide or monitor treatment for MRSA infections. Performed at [facility], [place], [place] [address]     Best Practice/Protocols:   Events:   Studies: Dg Chest 2 View  Result Date: 03/07/2018 CLINICAL DATA:  67 year old male with history of weakness, diarrhea and decreasing oxygenation. Former smoker. EXAM: CHEST - 2 VIEW COMPARISON:  Chest x-ray 09/22/2017. FINDINGS: Lung volumes are low. Elevation of the right hemidiaphragm. No consolidative airspace disease. No pleural effusions. No pneumothorax. No pulmonary nodule or mass noted. Pulmonary vasculature and the cardiomediastinal  silhouette are within normal limits. IMPRESSION: 1. Low lung volumes without radiographic evidence of acute cardiopulmonary disease. Electronically Signed   By: Vinnie Langton M.D.   On: 03/07/2018 10:31   Ct Head Wo Contrast  Result Date: 03/07/2018 CLINICAL DATA:  Pt states he has been very weak and is not steady on his feet. Pt denies any fall. Pt states he is in detox. EXAM: CT HEAD WITHOUT CONTRAST TECHNIQUE: Contiguous axial images were obtained from the base of the skull through the vertex without intravenous contrast. COMPARISON:  Head CT 09/22/2017 FINDINGS: Brain: No acute intracranial hemorrhage. No focal mass lesion. No CT evidence of acute infarction. No midline shift or mass effect. No hydrocephalus. Basilar cisterns are patent. Mild atrophy.  Mild white matter microvascular disease. Vascular: No hyperdense vessel or unexpected calcification. Skull: Normal. Negative for fracture or focal lesion. Sinuses/Orbits: Paranasal sinuses and mastoid air cells are clear. Orbits are clear. Other: None. IMPRESSION: 1. No acute intracranial findings. 2. Mild atrophy and white matter microvascular disease. Electronically Signed   By: Suzy Bouchard M.D.   On: 03/07/2018 17:01    Consults: Treatment Team:  Hermelinda Dellen, DO   Subjective:    Overnight Issues: patient remains confused, presently on CIWA protocol with sitter.  Objective:  Vital signs for last 24 hours: Temp:  [98.2 F (36.8 C)-98.6 F (37 C)] 98.3 F (36.8 C) (05/06 1930) Pulse Rate:  [53-124] 95 (05/07 0700) Resp:  [12-29] 20 (05/07 0700) BP: (124-182)/(72-109) 152/92 (05/07 0700) SpO2:  [98 %-100 %] 100 % (05/07 0700) Weight:  [113 lb 12.1 oz (51.6 kg)] 113 lb 12.1 oz (51.6 kg) (  05/06 1000)  Hemodynamic parameters for last 24 hours:    Intake/Output from previous day: 05/06 0701 - 05/07 0700 In: 2924.6 [I.V.:2574.6; IV Piggyback:350] Out: 2800 [Urine:2800]  Intake/Output this shift: No intake/output data  recorded.  Vent settings for last 24 hours:    Physical Exam:  [name] somnolent but arousable Vital signs: Please see above listed nurses notes, ventricular response is controlled at 95 HEENT: Poor oral hygiene, no jugular venous distention, trachea is midline, no accessory muscle utilization Cardiovascular: Regular rate and rhythm this morning Pulmonary: Mild rhonchi appreciated Abdominal: Positive bowel sounds Extremities: No clubbing cyanosis or edema noted Neurologic: [name] still confused and encephalopathic  Assessment/Plan:   Encephalopathy. Patient's initial alcohol level was 386. Has been started on CIWA protocol along with magnesium, thiamine, multivitamins, is at high risk for withdrawal.for completeness sake we'll send a urine drug screen, Tylenol level, salicylate and serum osmolality  Hypokalemia. Will replace  Patient has been involuntarily committed. Will need to have    , 03/09/2018  *Care during the described time interval was provided by me and/or other providers on the critical care team.  I have reviewed this patient's available data, including medical history, events of note, physical examination and test results as part of my evaluation.

## 2018-03-09 NOTE — Progress Notes (Signed)
[facility] [place] NAME: Douglas Peterson    MR#:  [id number]  DATE OF BIRTH:  10/26/1951  SUBJECTIVE:  Patient lying on the bed Sleepy and lethargic Unable to give any history   REVIEW OF SYSTEMS:    ROS Unable to obtain secondary to sedation and lethargy .   DRUG ALLERGIES:  No Known Allergies  VITALS:  Blood pressure (!) 152/91, pulse (!) 106, temperature 98.9 F (37.2 C), temperature source Axillary, resp. rate (!) 26, height 5' 3"  (1.6 m), weight 51.6 kg (113 lb 12.1 oz), SpO2 99 %.  PHYSICAL EXAMINATION:   Physical Exam  GENERAL:  67 y.o.-year-old patient lying in the bed  EYES: Pupils equal, round, reactive to light and accommodation. No scleral icterus. Extraocular muscles intact.  HEENT: Head atraumatic, normocephalic. Oropharynx and nasopharynx clear.  NECK:  Supple, no jugular venous distention. No thyroid enlargement, no tenderness.  LUNGS: Normal breath sounds bilaterally, no wheezing, rales, rhonchi. No use of accessory muscles of respiration.  CARDIOVASCULAR: S1, S2 normal. No murmurs, rubs, or gallops.  ABDOMEN: Soft, nontender, nondistended. Bowel sounds present. No organomegaly or mass.  EXTREMITIES: No cyanosis, clubbing or edema b/l.    NEUROLOGIC: Could not be assessed PSYCHIATRIC: The patient is alert and oriented x 1 SKIN: No obvious rash, lesion, or ulcer.   LABORATORY PANEL:   CBC Recent Labs  Lab 03/07/18 0334  WBC 5.3  HGB 13.9  HCT 39.6*  PLT 83*   ------------------------------------------------------------------------------------------------------------------ Chemistries  Recent Labs  Lab 03/07/18 0334  03/09/18 0631  NA 136   < > 137  K 3.5   < > 3.4*  CL 97*   < > 102  CO2 29   < > 26  GLUCOSE 93   < > 80  BUN <5*   < > <5*  CREATININE 0.49*   < > 0.55*  CALCIUM 8.1*   < > 8.1*  MG  --    < > 1.7  AST 104*  --   --   ALT 44  --   --   ALKPHOS 159*  --   --   BILITOT 0.9   --   --    < > = values in this interval not displayed.   ------------------------------------------------------------------------------------------------------------------  Cardiac Enzymes No results for input(s): TROPONINI in the last 168 hours. ------------------------------------------------------------------------------------------------------------------  RADIOLOGY:  Ct Head Wo Contrast  Result Date: 03/07/2018 CLINICAL DATA:  Pt states he has been very weak and is not steady on his feet. Pt denies any fall. Pt states he is in detox. EXAM: CT HEAD WITHOUT CONTRAST TECHNIQUE: Contiguous axial images were obtained from the base of the skull through the vertex without intravenous contrast. COMPARISON:  Head CT 09/22/2017 FINDINGS: Brain: No acute intracranial hemorrhage. No focal mass lesion. No CT evidence of acute infarction. No midline shift or mass effect. No hydrocephalus. Basilar cisterns are patent. Mild atrophy.  Mild white matter microvascular disease. Vascular: No hyperdense vessel or unexpected calcification. Skull: Normal. Negative for fracture or focal lesion. Sinuses/Orbits: Paranasal sinuses and mastoid air cells are clear. Orbits are clear. Other: None. IMPRESSION: 1. No acute intracranial findings. 2. Mild atrophy and white matter microvascular disease. Electronically Signed   By: Suzy Bouchard M.D.   On: 03/07/2018 17:01     ASSESSMENT AND PLAN:   67 year old male patient with history of hypertension, alcohol abuse was admitted for alcohol intoxication with a level greater than 300 and change  in mental status along with atrial fibrillation on amiodarone drip.  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 Management plans discussed with the patient, family and they are in agreement.  CODE STATUS: Full code  DVT Prophylaxis: SCDs  TOTAL TIME TAKING CARE OF THIS PATIENT: 36 minutes.   POSSIBLE D/C IN 2 to 3 DAYS, DEPENDING ON CLINICAL CONDITION.  Saundra Shelling M.D on 03/09/2018 at 3:29 PM  Between 7am to 6pm - Pager - [phone]  After 6pm go to [email or web address] - password [email or web address] [facility]  Office  [phone]  CC: Primary care physician; [name], MD  Note: This dictation was prepared with Dragon dictation along with smaller phrase technology. Any transcriptional errors that result from this process are unintentional.

## 2018-03-10 LAB — BASIC METABOLIC PANEL
Anion gap: 12 (ref 5–15)
BUN: 5 mg/dL — AB (ref 6–20)
CALCIUM: 8.4 mg/dL — AB (ref 8.9–10.3)
CHLORIDE: 101 mmol/L (ref 101–111)
CO2: 22 mmol/L (ref 22–32)
CREATININE: 0.63 mg/dL (ref 0.61–1.24)
GFR calc non Af Amer: >60 mL/min (ref >60)
Glucose, Bld: 62 mg/dL — ABNORMAL LOW (ref 65–99)
Potassium: 4.1 mmol/L (ref 3.5–5.1)
SODIUM: 135 mmol/L (ref 135–145)

## 2018-03-10 LAB — CBC
HEMATOCRIT: 38.1 % — AB (ref 40.0–52.0)
HEMOGLOBIN: 13.4 g/dL (ref 13.0–18.0)
MCH: 33.6 pg (ref 26.0–34.0)
MCHC: 35.2 g/dL (ref 32.0–36.0)
MCV: 95.2 fL (ref 80.0–100.0)
Platelets: 61 10*3/uL — ABNORMAL LOW (ref 150–440)
RBC: 4.01 MIL/uL — ABNORMAL LOW (ref 4.40–5.90)
RDW: 14.6 % — AB (ref 11.5–14.5)
WBC: 4.1 10*3/uL (ref 3.8–10.6)

## 2018-03-10 LAB — MAGNESIUM: Magnesium: 1.8 mg/dL (ref 1.7–2.4)

## 2018-03-10 MED ORDER — DILTIAZEM HCL ER COATED BEADS 120 MG PO CP24
120.0000 mg | ORAL_CAPSULE | Freq: Two times a day (BID) | ORAL | Status: DC
  Administered 2018-03-12 – 2018-03-16 (×8): 120 mg via ORAL
  Filled 2018-03-10 (×10): qty 1

## 2018-03-10 MED ORDER — VITAMIN B-1 100 MG PO TABS
100.0000 mg | ORAL_TABLET | Freq: Every day | ORAL | Status: DC
  Filled 2018-03-10: qty 1

## 2018-03-10 MED ORDER — MAGNESIUM OXIDE 400 (241.3 MG) MG PO TABS
400.0000 mg | ORAL_TABLET | Freq: Two times a day (BID) | ORAL | Status: AC
  Filled 2018-03-10: qty 1

## 2018-03-10 MED ORDER — SODIUM CHLORIDE 0.9 % IV SOLN
INTRAVENOUS | Status: DC
  Administered 2018-03-10 – 2018-03-15 (×7): via INTRAVENOUS

## 2018-03-10 MED ORDER — FOLIC ACID 1 MG PO TABS
1.0000 mg | ORAL_TABLET | Freq: Every day | ORAL | Status: DC
  Administered 2018-03-12 – 2018-03-16 (×5): 1 mg via ORAL
  Filled 2018-03-10 (×6): qty 1

## 2018-03-10 NOTE — Plan of Care (Addendum)
  Problem: Clinical Measurements: Goal: Will remain free from infection Outcome: Progressing   Problem: Safety: Goal: Ability to remain free from injury will improve 03/10/2018 0029 by Liliane Channel, RN Outcome: Progressing 03/10/2018 0029 by Liliane Channel, RN Outcome: Progressing    Update 0417: Pt BP was at 173/93 ,HR 65. PRN Hyralazine 10 mg was given at 0426. Will continue to monitor  Update 0537: Pt BP was at 139/83, HR 83. Will continue to monitor.

## 2018-03-10 NOTE — Progress Notes (Signed)
Patient sleeping at this time. Will not complete CIWA assessment at this time, as it obviously would be counterproductive. Next CIWA to be evaluated in 6 hours. Will continue to monitor status. Wenda Low [facility]
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
## 2018-03-10 NOTE — Progress Notes (Signed)
Pharmacy Electrolyte Monitoring Consult:  Pharmacy consulted to assist in monitoring and replacing electrolytes in this 67 y.o. male admitted on 03/07/2018 with alcohol withdrawal.    Labs:  Sodium (mmol/L)  Date Value  03/10/2018 135   Potassium (mmol/L)  Date Value  03/10/2018 4.1   Magnesium (mg/dL)  Date Value  03/10/2018 1.8   Phosphorus (mg/dL)  Date Value  03/09/2018 2.7   Calcium (mg/dL)  Date Value  03/10/2018 8.4 (L)   Albumin (g/dL)  Date Value  03/07/2018 4.4    Plan: Mg is at the low end of normal. Will order MagOx 443m BID x 4 doses.   No other supplementation needed at this time.   Pharmacy will continue to monitor and adjust per consult.   SPernell Dupre PharmD, BCPS Clinical Pharmacist 03/10/2018 7:26 AM

## 2018-03-10 NOTE — Plan of Care (Signed)
  Problem: Education: Goal: Knowledge of General Education information will improve Outcome: Not Progressing Note:  Patient still suffering from what the physician is calling alcohol-induced encephalopathy. Neurological state not sufficient to achieve this goal today. Will continue to monitor for improvement. Wenda Low [facility]

## 2018-03-10 NOTE — Progress Notes (Signed)
Patient has been resting comfortably for the past few hours in the bed. Sitter remains. Will continue to monitor. Douglas Peterson [facility]
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
## 2018-03-10 NOTE — Progress Notes (Signed)
Advanced care plan.  Purpose of the Encounter: CODE STATUS Parties in Attendance:Patient and Patients sister Patient's Decision Capacity:Fair Subjective/Patient's story: Presented for alcohol withdrawal, diarrhea and generalized weakness Objective/Medical story Has alcohol abuse, malnutrition, atrial fibrillation Goals of care determination:  Advanced directives were discussed in presence of patient's family For now patient and family want everything done which includes cardiac resuscitation, intubation and ventilator if the need arises CODE STATUS: Full code Time spent discussing advanced care planning: 16 minutes

## 2018-03-10 NOTE — Progress Notes (Signed)
[place] [facility] [place] NAME: Zell Hylton    MR#:  [id number]  DATE OF BIRTH:  20-Jul-1951  SUBJECTIVE:  Patient lying on the bed Is awake and alert Verbally responsive to commands and questions Has weakness and fatigue Has some tremors in the upper extremities   REVIEW OF SYSTEMS:    Review of Systems  Cardiovascular: Positive for palpitations.  Neurological: Positive for tremors and weakness.  Psychiatric/Behavioral: The patient is nervous/anxious.     [name]   DRUG ALLERGIES:  No Known Allergies  VITALS:  Blood pressure (!) 146/78, pulse 80, temperature 98.2 F (36.8 C), temperature source Axillary, resp. rate (!) 22, height 5' 3"  (1.6 m), weight 49 kg (108 lb), SpO2 95 %.  PHYSICAL EXAMINATION:   Physical Exam  GENERAL:  67 y.o.-year-old patient lying in the bed  EYES: Pupils equal, round, reactive to light and accommodation. No scleral icterus. Extraocular muscles intact.  HEENT: Head atraumatic, normocephalic. Oropharynx and nasopharynx clear.  NECK:  Supple, no jugular venous distention. No thyroid enlargement, no tenderness.  LUNGS: Normal breath sounds bilaterally, no wheezing, rales, rhonchi. No use of accessory muscles of respiration.  CARDIOVASCULAR: S1, S2 normal. No murmurs, rubs, or gallops.  ABDOMEN: Soft, nontender, nondistended. Bowel sounds present. No organomegaly or mass.  EXTREMITIES: No cyanosis, clubbing or edema b/l.    Has tremors in the upper extremities NEUROLOGIC : Alert and awake Responds to verbal commands Moves all extremities PSYCHIATRIC: The patient is alert and oriented x 2 SKIN: No obvious rash, lesion, or ulcer.   LABORATORY PANEL:   CBC Recent Labs  Lab 03/10/18 0432  WBC 4.1  HGB 13.4  HCT 38.1*  PLT 61*   ------------------------------------------------------------------------------------------------------------------ Chemistries  Recent Labs  Lab 03/07/18 0334   03/10/18 0432  NA 136   < > 135  K 3.5   < > 4.1  CL 97*   < > 101  CO2 29   < > 22  GLUCOSE 93   < > 62*  BUN <5*   < > 5*  CREATININE 0.49*   < > 0.63  CALCIUM 8.1*   < > 8.4*  MG  --    < > 1.8  AST 104*  --   --   ALT 44  --   --   ALKPHOS 159*  --   --   BILITOT 0.9  --   --    < > = values in this interval not displayed.   ------------------------------------------------------------------------------------------------------------------  Cardiac Enzymes No results for input(s): TROPONINI in the last 168 hours. ------------------------------------------------------------------------------------------------------------------  RADIOLOGY:  No results found.   ASSESSMENT AND PLAN:   67 year old male patient with history of hypertension, alcohol abuse was admitted for alcohol intoxication with a level greater than 300 and change in mental status along with atrial fibrillation on amiodarone drip.  -Acute encephalopathy improving Secondary to alcohol intoxication  On CIWA protocol  -Alcoholic dementia  -Alcohol withdrawal On CIWA protocol  -Moderate to severe malnutrition Dietary consult and nutritional supplements  -Hypokalemia Replace potassium  -Atrial fibrillation Off amiodarone drip Started on oral Cardizem for rate control  -Substance abuse counseling given to the patient  -Discussed medical condition treatment plan with patient's sister in detail along with the patient  All the records are reviewed and case discussed with Care Management/Social Worker. Management plans discussed with the patient, family and they are in agreement.  CODE STATUS: Full code  DVT Prophylaxis: SCDs  TOTAL TIME TAKING CARE OF THIS PATIENT: 35 minutes.   POSSIBLE D/C IN 2 to 3 DAYS, DEPENDING ON CLINICAL CONDITION.  Saundra Shelling M.D on 03/10/2018 at 3:40 PM  Between 7am to 6pm - Pager - [phone]  After 6pm go to [email or web address] - password [email or web address] [facility]  Office  [phone]  CC: Primary care physician; [name], MD  Note: This dictation was prepared with Dragon dictation along with smaller phrase technology. Any transcriptional errors that result from this process are unintentional.

## 2018-03-11 ENCOUNTER — Inpatient Hospital Stay: Payer: Medicare Other

## 2018-03-11 ENCOUNTER — Encounter: Payer: Self-pay | Admitting: Internal Medicine

## 2018-03-11 MED ORDER — VITAMIN B-1 100 MG PO TABS: 100.0000 mg | ORAL_TABLET | Freq: Every day | ORAL | Status: DC

## 2018-03-11 MED ORDER — LABETALOL HCL 5 MG/ML IV SOLN: 10.0000 mg | INTRAVENOUS | Status: DC | PRN

## 2018-03-11 MED ORDER — THIAMINE HCL 100 MG/ML IJ SOLN
500.0000 mg | Freq: Three times a day (TID) | INTRAVENOUS | Status: DC
  Administered 2018-03-11 – 2018-03-12 (×5): 500 mg via INTRAVENOUS
  Filled 2018-03-11 (×6): qty 5

## 2018-03-11 MED ORDER — THIAMINE HCL 100 MG/ML IJ SOLN
250.0000 mg | Freq: Every day | INTRAMUSCULAR | Status: DC
  Filled 2018-03-11: qty 4

## 2018-03-11 NOTE — Progress Notes (Addendum)
Initial Nutrition Assessment  DOCUMENTATION CODES:   Non-severe (moderate) malnutrition in context of chronic illness  INTERVENTION:   Rena-vite daily- to provide B vitamins and C  Recommend monitor K, Mg, and P labs as pt likely at high refeeding risk.      Ensure Enlive po BID, each supplement provides 350 kcal and 20 grams of protein  MVI daily  Folic acid 71m po daily  Thiamine- Recommend 5014mIV three times a day for 2 days, followed by 25083mV daily for 5 days  NUTRITION DIAGNOSIS:   Moderate Malnutrition related to chronic illness(etoh abuse) as evidenced by moderate fat depletion, moderate muscle depletion.  GOAL:   Patient will meet greater than or equal to 90% of their needs  MONITOR:   Diet advancement, Labs, Weight trends, I & O's, Skin  REASON FOR ASSESSMENT:   Low Braden    ASSESSMENT:   [age]o male with h/o etoh abuse admitted with ataxia and AMS   Visited pt's room today. Unable to obtain nutrition related history as pt with AMS. Pt familiar to nutrition department from previous admits. Pt with poor appetite and oral intake at baseline r/t etoh abuse. Pt at high risk for Wernicke's; recommend high dose thiamine supplementation. Per chart, pt appears to be weight stable from his previous admit in January. RD will order Ensure supplements when diet advanced. Pt on CIWA protocol. Pt s/o CSE 5/8; pt NPO. Pt with diarrhea; pt also reported having diarrhea in January. RD unsure if this is chronic. Recommend monitor K, Mg, and P labs as pt likely at high refeeding risk.       Medications reviewed and include: aspirin, folic acid, Mg oxide, MVI, miralax, thiamine, NaCl @75ml /hr   Labs reviewed: BUN 62(H), BUN 5(L), Ca 8.4(L), Mg 1.8 wnl  NUTRITION - FOCUSED PHYSICAL EXAM:    Most Recent Value  Orbital Region  Moderate depletion  Upper Arm Region  Severe depletion  Thoracic and Lumbar Region  Severe depletion  Buccal Region  Moderate depletion  Temple  Region  Mild depletion  Clavicle Bone Region  Moderate depletion  Clavicle and Acromion Bone Region  Moderate depletion  Scapular Bone Region  Moderate depletion  Dorsal Hand  Moderate depletion  Patellar Region  Moderate depletion  Anterior Thigh Region  Moderate depletion  Posterior Calf Region  Moderate depletion  Edema (RD Assessment)  None  Hair  Reviewed  Eyes  Reviewed  Mouth  Reviewed  Skin  Reviewed  Nails  Reviewed     Diet Order:   Diet Order           Diet NPO time specified  Diet effective now         EDUCATION NEEDS:   Not appropriate for education at this time  Skin:  Skin Assessment: (abrasions )  Last BM:  Pt reports diarrhea pta  Height:   Ht Readings from Last 1 Encounters:  03/09/18 5' 3"  (1.6 m)    Weight:   Wt Readings from Last 1 Encounters:  03/10/18 108 lb (49 kg)    Ideal Body Weight:  56.3 kg  BMI:  Body mass index is 19.13 kg/m.  Estimated Nutritional Needs:   Kcal:  1600-1800kcal/day   Protein:  77-87g/day   Fluid:  >1.5L/day   CasKoleen Distance, RD, LDN Pager #- [phone]#- [id number] Hours Pager: [phone]

## 2018-03-11 NOTE — Plan of Care (Signed)
Sitter remains at bedside.  Somnolent most of shift but arouses easily.

## 2018-03-11 NOTE — Progress Notes (Signed)
[place] at [place] NAME: Douglas Peterson    MR#:  [id number]  DATE OF BIRTH:  02-12-1951  SUBJECTIVE:  Patient lying on the bed Confused.  Sitter at bedside.  REVIEW OF SYSTEMS:    Review of Systems  Unable to perform ROS: Mental status change   DRUG ALLERGIES:  No Known Allergies  VITALS:  Blood pressure (!) 156/89, pulse (!) 101, temperature 98.2 F (36.8 C), temperature source Oral, resp. rate 20, height 5' 3"  (1.6 m), weight 49 kg (108 lb), SpO2 97 %.  PHYSICAL EXAMINATION:   Physical Exam  GENERAL:  67 y.o.-year-old patient lying in the bed  EYES: Pupils equal, round, reactive to light and accommodation. No scleral icterus. Extraocular muscles intact.  HEENT: Head atraumatic, normocephalic. Oropharynx and nasopharynx clear.  NECK:  Supple, no jugular venous distention. No thyroid enlargement, no tenderness.  LUNGS: Normal breath sounds bilaterally, no wheezing, rales, rhonchi. No use of accessory muscles of respiration.  CARDIOVASCULAR: S1, S2 normal. No murmurs, rubs, or gallops.  ABDOMEN: Soft, nontender, nondistended. Bowel sounds present. No organomegaly or mass.  EXTREMITIES: No cyanosis, clubbing or edema b/l.    Has tremors in the upper extremities NEUROLOGIC : Alert and awake Responds to verbal commands Moves all extremities PSYCHIATRIC: The patient is alert and awake.  Restless. SKIN: No obvious rash, lesion, or ulcer.   LABORATORY PANEL:   CBC Recent Labs  Lab 03/10/18 0432  WBC 4.1  HGB 13.4  HCT 38.1*  PLT 61*   ------------------------------------------------------------------------------------------------------------------ Chemistries  Recent Labs  Lab 03/07/18 0334  03/10/18 0432  NA 136   < > 135  K 3.5   < > 4.1  CL 97*   < > 101  CO2 29   < > 22  GLUCOSE 93   < > 62*  BUN <5*   < > 5*  CREATININE 0.49*   < > 0.63  CALCIUM 8.1*   < > 8.4*  MG  --    < > 1.8  AST 104*  --   --   ALT  44  --   --   ALKPHOS 159*  --   --   BILITOT 0.9  --   --    < > = values in this interval not displayed.   ------------------------------------------------------------------------------------------------------------------  Cardiac Enzymes No results for input(s): TROPONINI in the last 168 hours. ------------------------------------------------------------------------------------------------------------------  RADIOLOGY:  No results found.   ASSESSMENT AND PLAN:   67 year old male patient with history of hypertension, alcohol abuse was admitted for alcohol intoxication with a level greater than 300 and change in mental status along with atrial fibrillation on amiodarone drip.  -Dysphagia.  Patient will have modified barium swallow later today.  N.p.o. due to high risk for aspiration.  -Acute metabolic encephalopathy improving Secondary to alcohol intoxication initially and later withdrawal On CIWA protocol  -Alcoholic dementia  -Moderate to severe malnutrition Dietary consult and nutritional supplements  -Hypokalemia Replaced  -Atrial fibrillation Off amiodarone drip Started on oral Cardizem for rate control  -Substance abuse counseling given to the patient  All the records are reviewed and case discussed with Care Management/Social Worker. Management plans discussed with the patient, family and they are in agreement.  CODE STATUS: Full code  DVT Prophylaxis: SCDs  TOTAL TIME TAKING CARE OF THIS PATIENT: 35 minutes.   POSSIBLE D/C IN 2 to 3 DAYS, DEPENDING ON CLINICAL CONDITION.  Neita Carp M.D on 03/11/2018 at 1:03  PM  Between 7am to 6pm - Pager - [phone]  After 6pm go to [email or web address] - password [email or web address] [place] [facility]  Office  [phone]  CC: Primary care physician; [name], MD  Note: This dictation was prepared with Dragon dictation along with smaller phrase technology. Any transcriptional errors that result from this  process are unintentional.

## 2018-03-11 NOTE — Care Management Important Message (Signed)
Copy of signed IM left in patient's room.    
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
## 2018-03-12 ENCOUNTER — Inpatient Hospital Stay: Payer: Medicare Other

## 2018-03-12 LAB — MAGNESIUM: Magnesium: 1.3 mg/dL — ABNORMAL LOW (ref 1.7–2.4)

## 2018-03-12 LAB — BASIC METABOLIC PANEL
Anion gap: 13 (ref 5–15)
BUN: 5 mg/dL — ABNORMAL LOW (ref 6–20)
CO2: 17 mmol/L — AB (ref 22–32)
CREATININE: 0.6 mg/dL — AB (ref 0.61–1.24)
Calcium: 8.2 mg/dL — ABNORMAL LOW (ref 8.9–10.3)
Chloride: 103 mmol/L (ref 101–111)
GFR calc non Af Amer: >60 mL/min (ref >60)
GLUCOSE: 64 mg/dL — AB (ref 65–99)
Potassium: 3.2 mmol/L — ABNORMAL LOW (ref 3.5–5.1)
Sodium: 133 mmol/L — ABNORMAL LOW (ref 135–145)

## 2018-03-12 LAB — GLUCOSE, CAPILLARY: Glucose-Capillary: 112 mg/dL — ABNORMAL HIGH (ref 65–99)

## 2018-03-12 LAB — PHOSPHORUS: Phosphorus: 3.3 mg/dL (ref 2.5–4.6)

## 2018-03-12 MED ORDER — POTASSIUM CHLORIDE 20 MEQ PO PACK: 40.0000 meq | PACK | Freq: Once | ORAL | Status: DC

## 2018-03-12 MED ORDER — POTASSIUM CHLORIDE 10 MEQ/100ML IV SOLN
10.0000 meq | INTRAVENOUS | Status: AC
  Administered 2018-03-12 (×3): 10 meq via INTRAVENOUS
  Filled 2018-03-12: qty 100

## 2018-03-12 MED ORDER — RENA-VITE PO TABS
1.0000 | ORAL_TABLET | Freq: Every day | ORAL | Status: DC
  Administered 2018-03-13 – 2018-03-15 (×3): 1 via ORAL
  Filled 2018-03-12 (×6): qty 1

## 2018-03-12 MED ORDER — THIAMINE HCL 100 MG/ML IJ SOLN
500.0000 mg | Freq: Three times a day (TID) | INTRAVENOUS | Status: DC
  Administered 2018-03-13: 500 mg via INTRAVENOUS
  Filled 2018-03-12 (×4): qty 5

## 2018-03-12 MED ORDER — MAGNESIUM SULFATE 4 GM/100ML IV SOLN
4.0000 g | Freq: Once | INTRAVENOUS | Status: AC
  Administered 2018-03-12: 4 g via INTRAVENOUS
  Filled 2018-03-12 (×2): qty 100

## 2018-03-12 MED ORDER — POTASSIUM CHLORIDE 10 MEQ/100ML IV SOLN
10.0000 meq | INTRAVENOUS | Status: DC
  Administered 2018-03-12: 10 meq via INTRAVENOUS
  Filled 2018-03-12 (×5): qty 100

## 2018-03-12 MED ORDER — POTASSIUM CHLORIDE 20 MEQ PO PACK: 20.0000 meq | PACK | Freq: Once | ORAL | Status: DC

## 2018-03-12 NOTE — Progress Notes (Signed)
Pharmacy Electrolyte Monitoring Consult:  Pharmacy consulted to assist in monitoring and replacing electrolytes in this 67 y.o. male admitted on 03/07/2018 with alcohol withdrawal.    Labs:  Sodium (mmol/L)  Date Value  03/12/2018 133 (L)   Potassium (mmol/L)  Date Value  03/12/2018 3.2 (L)   Magnesium (mg/dL)  Date Value  03/12/2018 1.3 (L)   Phosphorus (mg/dL)  Date Value  03/12/2018 3.3   Calcium (mg/dL)  Date Value  03/12/2018 8.2 (L)   Albumin (g/dL)  Date Value  03/07/2018 4.4    Plan: 5/10: Mg: 1.3, K: 3.2 Will replace with Mag sulfate 4g IV x 1 dose and KCL 45mq IV x 4 doses.   Pharmacy will continue to monitor and replace per consult.   SPernell Dupre PharmD, BCPS Clinical Pharmacist 03/12/2018 9:55 AM

## 2018-03-12 NOTE — Progress Notes (Signed)
Talked to Dr. Duane Boston about patient being lethargic, patient not waking up even with the sternal rub, patient drift back to sleep. Patient BP at 157/97, temperature 100.1 will give tylenol suppository. Blood sugar was 112.  Patient received IV 1 mg Ativan at 1142 and librium and baclofen at 1702. Order given to check for chest xray, to check blood sugar every 6 hours and to check BMP in the morning. Will hold p.o medication due to lethargy. And per MD if patient passed, RN ok to pronounce death. No other concern at the moment. RN will continue to monitor.
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
## 2018-03-12 NOTE — Care Management (Signed)
Reached out attending regarding possible need for palliative care consult if he does not tolerate his honey thick/pureed diet.  There is concern that patient would not follow this diet when discharges home.  Family also addressing concern that patient is not competent, nor does he have capacity.  Psych assessed 5/8.  Reached out to attending to also consider need for competency/capacity evaluation when patient is able to participate

## 2018-03-12 NOTE — Progress Notes (Signed)
[place] [facility] [place] NAME: Lenix Kidd    MR#:  [id number]  DATE OF BIRTH:  May 01, 1951  SUBJECTIVE:   Patient confused and drowsy today.  Sister at bedside.  Tolerated pured diet and honey thickened liquids.  REVIEW OF SYSTEMS:    Review of Systems  Unable to perform ROS: Mental status change   DRUG ALLERGIES:  No Known Allergies  VITALS:  Blood pressure (!) 162/89, pulse 84, temperature 98.7 F (37.1 C), temperature source Oral, resp. rate (!) 22, height 5' 3"  (1.6 m), weight 49 kg (108 lb), SpO2 98 %.  PHYSICAL EXAMINATION:   Physical Exam  GENERAL:  67 y.o.-year-old patient lying in the bed  EYES: Pupils equal, round, reactive to light and accommodation. No scleral icterus. Extraocular muscles intact.  HEENT: Head atraumatic, normocephalic. Oropharynx and nasopharynx clear.  NECK:  Supple, no jugular venous distention. No thyroid enlargement, no tenderness.  LUNGS: Normal breath sounds bilaterally, no wheezing, rales, rhonchi. No use of accessory muscles of respiration.  CARDIOVASCULAR: S1, S2 normal. No murmurs, rubs, or gallops.  ABDOMEN: Soft, nontender, nondistended. Bowel sounds present. No organomegaly or mass.  EXTREMITIES: No cyanosis, clubbing or edema b/l.    Has tremors in the upper extremities NEUROLOGIC : Alert and awake Responds to verbal commands Moves all extremities PSYCHIATRIC: The patient is drowsy and confused SKIN: No obvious rash, lesion, or ulcer.   LABORATORY PANEL:   CBC Recent Labs  Lab 03/10/18 0432  WBC 4.1  HGB 13.4  HCT 38.1*  PLT 61*   ------------------------------------------------------------------------------------------------------------------ Chemistries  Recent Labs  Lab 03/07/18 0334  03/12/18 0437  NA 136   < > 133*  K 3.5   < > 3.2*  CL 97*   < > 103  CO2 29   < > 17*  GLUCOSE 93   < > 64*  BUN <5*   < > 5*  CREATININE 0.49*   < > 0.60*  CALCIUM 8.1*   < >  8.2*  MG  --    < > 1.3*  AST 104*  --   --   ALT 44  --   --   ALKPHOS 159*  --   --   BILITOT 0.9  --   --    < > = values in this interval not displayed.   ------------------------------------------------------------------------------------------------------------------  Cardiac Enzymes No results for input(s): TROPONINI in the last 168 hours. ------------------------------------------------------------------------------------------------------------------  RADIOLOGY:  No results found.   ASSESSMENT AND PLAN:   67 year old male patient with history of hypertension, alcohol abuse was admitted for alcohol intoxication with a level greater than 300 and change in mental status along with atrial fibrillation on amiodarone drip.  -Dysphagia Some improvement today.  On pured and honey thickened liquid.  Continues to be high risk for aspiration.  Unlikely to meet his nutritional goals.  May end up needing a PEG tube.  Will consult palliative care. Does not have anybody designated as Advice worker. Sister at bedside mentions that he did not want daughter involved in his care.  -Acute metabolic encephalopathy improving Secondary to alcohol intoxication initially and later withdrawal On CIWA protocol  -Alcoholic dementia  -Moderate to severe malnutrition Dietary consult and nutritional supplements  -Hypokalemia Replaced  -Atrial fibrillation Off amiodarone drip Started on oral Cardizem for rate control  All the records are reviewed and case discussed with Care Management/Social Worker. Management plans discussed with the patient, family and they are in agreement.  CODE STATUS: Full code  DVT Prophylaxis: SCDs  TOTAL TIME TAKING CARE OF THIS PATIENT: 35 minutes.   POSSIBLE D/C IN 2 to 3 DAYS, DEPENDING ON CLINICAL CONDITION.  [name]  M.D on 03/12/2018 at 2:40 PM  Between 7am to 6pm - Pager - [phone]  After 6pm go to [email or web address] - password  [email or web address]  Office  [phone]  CC: Primary care physician; [name], MD  Note: This dictation was prepared with Dragon dictation along with smaller phrase technology. Any transcriptional errors that result from this process are unintentional.

## 2018-03-12 NOTE — Progress Notes (Signed)
Patient has sever tremors of his left arm only.  It occurs both when awake and asleep.  He can stop these tremors when asked to do so.  I don't think this is related to detox, he has milder tremors when asked to hold out his left arm.

## 2018-03-12 NOTE — Progress Notes (Signed)
Speech Language Pathology Treatment: Dysphagia  Patient Details Name: Douglas Peterson MRN: [id number] DOB: [birth year] Today's Date: 03/12/2018 Time: 0900-0930 SLP Time Calculation (min) (ACUTE ONLY): 30 min  Assessment / Plan / Recommendation Clinical Impression  Pt seen today to initiate an oral (modified) diet of Dysphagia 1 (PUREE) w/ HONEY consistency liquids VIA TSP only. MBSS completed yesterday w/ oropharyngeal phase dysphagia dx'd as well as Silent aspiration. Pt continues to present w/ declined Cognitive status and decreased awareness for follow through in oral intake task - he requires moderate cues. [name] degree of dysphagia and his declined Cognitive status significantly increase his risk for aspiration and declined Pulmonary status from such.  The results of the MBSS were discussed w/ Sister; pt unable to participate in the education/conversation. Thorough education of information provided. Sister gave verbal understanding. Trials of the recommended diet consistency were presented w/ no immediate, overt s/s of aspiration noted - no decline in respiratory status or vocal quality w/ the few tsp trials presented. Oral phase adequate for bolus management and clearing w/ the few trials given. NSG staff to continue w/ the breakfast meal next. As discussed and agreed upon w/ MD, this is a trial initiation of an oral diet w/ strict aspiration precautions in place. IF pt exhibits overt s/s of aspiration, NSG is to STOP po's and update MD for further recommendation. NSG will monitor pt's Pulmonary status over next 1-3 days as well for any decline. Recommend Dietician f/u as pt is at risk for not meeting nutrition/hydration needs w/ such a restricted diet and declined Cognitive/medical status'.  ST services will continue to f/u w/ pt's toleration of diet and education on precautions w/ Staff and family next 1-3 days. Recommend pills in Puree - Crushed (check w/ pharmacy). Recommend 100% Supervision by  NSG w/ any oral intake.     HPI HPI: [name] a 67 y/o male with past medical history of hypertension and chronic Alcohol abuse who presents emergency department with diarrhea.  He also complains of weakness.  Initially the patient's alcohol level was greater than 300.  Since presentation he has not had any alcohol and has now developed tremors and altered mental status.  While in the emergency department the patient also developed atrial fibrillation with rapid ventricular rate.  He was started on amiodarone drip.  Due to acute alcohol withdrawal the emergency department staff called the hospitalist service for admission to the step down unit of CCU. Pt was admitted w/ alcohol-induced encephalopathy. Pt is more awake today but noted muttered/mumbled speech of few, choppy words w/ confusion and poor follow through w/ any instruction; tremors. MBSS revealed oropharyngeal phase dysphagia w/ Aspiration(silent).      SLP Plan  Continue with current plan of care       Recommendations  Diet recommendations: Dysphagia 1 (puree);Honey-thick liquid Liquids provided via: Teaspoon Medication Administration: Crushed with puree Supervision: Staff to assist with self feeding;Full supervision/cueing for compensatory strategies Compensations: Minimize environmental distractions;Slow rate;Small sips/bites;Lingual sweep for clearance of pocketing;Multiple dry swallows after each bite/sip;Follow solids with liquid Postural Changes and/or Swallow Maneuvers: Seated upright 90 degrees;Upright 30-60 min after meal                General recommendations: (Dietician f/u) Oral Care Recommendations: Oral care BID;Staff/trained caregiver to provide oral care Follow up Recommendations: Skilled Nursing facility SLP Visit Diagnosis: Dysphagia, oropharyngeal phase (R13.12) Plan: Continue with current plan of care       GO  Douglas Peterson, [place], CCC-SLP Douglas Peterson 03/12/2018, 2:59  PM
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
## 2018-03-13 DIAGNOSIS — Y909 Presence of alcohol in blood, level not specified: Secondary | ICD-10-CM

## 2018-03-13 DIAGNOSIS — K7460 Unspecified cirrhosis of liver: Secondary | ICD-10-CM

## 2018-03-13 DIAGNOSIS — F10231 Alcohol dependence with withdrawal delirium: Secondary | ICD-10-CM

## 2018-03-13 DIAGNOSIS — Z87891 Personal history of nicotine dependence: Secondary | ICD-10-CM

## 2018-03-13 LAB — GLUCOSE, CAPILLARY
Glucose-Capillary: 105 mg/dL — ABNORMAL HIGH (ref 65–99)
Glucose-Capillary: 114 mg/dL — ABNORMAL HIGH (ref 65–99)
Glucose-Capillary: 124 mg/dL — ABNORMAL HIGH (ref 65–99)
Glucose-Capillary: 127 mg/dL — ABNORMAL HIGH (ref 65–99)
Glucose-Capillary: 143 mg/dL — ABNORMAL HIGH (ref 65–99)

## 2018-03-13 LAB — BASIC METABOLIC PANEL
Anion gap: 7 (ref 5–15)
BUN: <5 mg/dL — ABNORMAL LOW (ref 6–20)
CALCIUM: 8.2 mg/dL — AB (ref 8.9–10.3)
CO2: 23 mmol/L (ref 22–32)
Chloride: 103 mmol/L (ref 101–111)
Creatinine, Ser: 0.51 mg/dL — ABNORMAL LOW (ref 0.61–1.24)
GFR calc Af Amer: >60 mL/min (ref >60)
GFR calc non Af Amer: >60 mL/min (ref >60)
GLUCOSE: 99 mg/dL (ref 65–99)
Potassium: 3.2 mmol/L — ABNORMAL LOW (ref 3.5–5.1)
Sodium: 133 mmol/L — ABNORMAL LOW (ref 135–145)

## 2018-03-13 LAB — MAGNESIUM: MAGNESIUM: 1.6 mg/dL — AB (ref 1.7–2.4)

## 2018-03-13 MED ORDER — THIAMINE HCL 100 MG/ML IJ SOLN
100.0000 mg | Freq: Three times a day (TID) | INTRAMUSCULAR | Status: AC
  Administered 2018-03-13 – 2018-03-15 (×9): 100 mg via INTRAVENOUS
  Filled 2018-03-13 (×8): qty 2

## 2018-03-13 MED ORDER — MAGNESIUM OXIDE 400 (241.3 MG) MG PO TABS
400.0000 mg | ORAL_TABLET | Freq: Every day | ORAL | Status: DC
  Administered 2018-03-14 – 2018-03-16 (×3): 400 mg via ORAL
  Filled 2018-03-13 (×3): qty 1

## 2018-03-13 MED ORDER — VITAMIN B-1 100 MG PO TABS
100.0000 mg | ORAL_TABLET | Freq: Every day | ORAL | Status: DC
  Administered 2018-03-16: 100 mg via ORAL
  Filled 2018-03-13: qty 1

## 2018-03-13 MED ORDER — POTASSIUM CHLORIDE 20 MEQ PO PACK
40.0000 meq | PACK | Freq: Once | ORAL | Status: AC
  Administered 2018-03-13: 40 meq via ORAL
  Filled 2018-03-13: qty 2

## 2018-03-13 MED ORDER — MAGNESIUM SULFATE 2 GM/50ML IV SOLN
2.0000 g | Freq: Once | INTRAVENOUS | Status: AC
  Administered 2018-03-13: 2 g via INTRAVENOUS
  Filled 2018-03-13: qty 50

## 2018-03-13 NOTE — Progress Notes (Signed)
Pt sleep the whole night last night. Around 0620 I did try to wake him and and do a sternal rub and ask him to open his eyes and he did try to open it. Will continue to monitor.

## 2018-03-13 NOTE — Progress Notes (Signed)
Patient started the day lethargic and difficult to arouse. He quickly went back to sleep.  He was oriented only to person.  As the NT engaged  him in conversation he became more alert and had appropriate conversations with her.
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
## 2018-03-13 NOTE — Plan of Care (Signed)
  Problem: Clinical Measurements: Goal: Will remain free from infection Outcome: Progressing   Problem: Safety: Goal: Ability to remain free from injury will improve Outcome: Progressing   

## 2018-03-13 NOTE — Progress Notes (Signed)
[place] [facility] [place] NAME: Douglas Peterson    MR#:  [id number]  DATE OF BIRTH:  [birth year]  SUBJECTIVE:  CHIEF COMPLAINT:   Chief Complaint  Patient presents with  . Diarrhea  . Weakness  Patient evaluated and from the nursing staff as well as the patient's sister, patient more awake/alert, patient wants to go home, per sister-patient should not go home and needs to be placed, psychiatry consulted to determine capacity  REVIEW OF SYSTEMS:  CONSTITUTIONAL: No fever, fatigue or weakness.  EYES: No blurred or double vision.  EARS, NOSE, AND THROAT: No tinnitus or ear pain.  RESPIRATORY: No cough, shortness of breath, wheezing or hemoptysis.  CARDIOVASCULAR: No chest pain, orthopnea, edema.  GASTROINTESTINAL: No nausea, vomiting, diarrhea or abdominal pain.  GENITOURINARY: No dysuria, hematuria.  ENDOCRINE: No polyuria, nocturia,  HEMATOLOGY: No anemia, easy bruising or bleeding SKIN: No rash or lesion. MUSCULOSKELETAL: No joint pain or arthritis.   NEUROLOGIC: No tingling, numbness, weakness.  PSYCHIATRY: No anxiety or depression.   ROS  DRUG ALLERGIES:  No Known Allergies  VITALS:  Blood pressure 129/83, pulse 85, temperature (!) 97.3 F (36.3 C), temperature source Axillary, resp. rate (!) 23, height 5' 3"  (1.6 m), weight 49 kg (108 lb), SpO2 97 %.  PHYSICAL EXAMINATION:  GENERAL:  67 y.o.-year-old patient lying in the bed with no acute distress.  EYES: Pupils equal, round, reactive to light and accommodation. No scleral icterus. Extraocular muscles intact.  HEENT: Head atraumatic, normocephalic. Oropharynx and nasopharynx clear.  NECK:  Supple, no jugular venous distention. No thyroid enlargement, no tenderness.  LUNGS: Normal breath sounds bilaterally, no wheezing, rales,rhonchi or crepitation. No use of accessory muscles of respiration.  CARDIOVASCULAR: S1, S2 normal. No murmurs, rubs, or gallops.  ABDOMEN: Soft, nontender,  nondistended. Bowel sounds present. No organomegaly or mass.  EXTREMITIES: No pedal edema, cyanosis, or clubbing.  NEUROLOGIC: Cranial nerves II through XII are intact. Muscle strength 5/5 in all extremities. Sensation intact. Gait not checked.  PSYCHIATRIC: The patient is alert and oriented x 3.  SKIN: No obvious rash, lesion, or ulcer.   Physical Exam LABORATORY PANEL:   CBC Recent Labs  Lab 03/10/18 0432  WBC 4.1  HGB 13.4  HCT 38.1*  PLT 61*   ------------------------------------------------------------------------------------------------------------------  Chemistries  Recent Labs  Lab 03/07/18 0334  03/13/18 0528  NA 136   < > 133*  K 3.5   < > 3.2*  CL 97*   < > 103  CO2 29   < > 23  GLUCOSE 93   < > 99  BUN <5*   < > <5*  CREATININE 0.49*   < > 0.51*  CALCIUM 8.1*   < > 8.2*  MG  --    < > 1.6*  AST 104*  --   --   ALT 44  --   --   ALKPHOS 159*  --   --   BILITOT 0.9  --   --    < > = values in this interval not displayed.   ------------------------------------------------------------------------------------------------------------------  Cardiac Enzymes No results for input(s): TROPONINI in the last 168 hours. ------------------------------------------------------------------------------------------------------------------  RADIOLOGY:  Dg Chest 1 View  Result Date: 03/12/2018 CLINICAL DATA:  Lethargy. EXAM: CHEST  1 VIEW COMPARISON:  03/07/2018. FINDINGS: Normal heart size, stable from priors. No consolidation or edema. Poor inspiratory effort. Mild blunting of the RIGHT CP angle appears stable. Skeletal osteopenia. IMPRESSION: Low lung volumes.  No  active disease. Electronically Signed   By: Staci Righter M.D.   On: 03/12/2018 23:50    ASSESSMENT AND PLAN:   67 year old male patient with history of hypertension, alcohol abuse was admitted for alcohol intoxication with a level greater than 300 and change in mental status along with atrial fibrillation  on amiodarone drip.  -Dysphagia, acute on chronic Continue pured and honey thickened liquid, speech therapy input appreciated, continue aspiration/fall precautions, ? need for PEG tube, palliative care following Sister at bedside mentions that he did not want daughter involved in his care.  -Acute metabolic encephalopathy  Resolved  At neurologic baseline per sister, but remains to have severe deconditioning-physical therapy to see  Secondary to alcohol intoxication as well as alcohol withdrawal Continue alcohol withdrawal protocol  -Alcoholic dementia Stable At neurologic baseline per sister Consult psychiatry to determine capacity as patient wants to go home but family states that he is best to be placed in the facility  -Moderate to severe malnutrition Dietary consulted  -Hypokalemia Replaced  -Atrial fibrillation Off amiodarone drip Continue Cardizem   All the records are reviewed and case discussed with Care Management/Social Workerr. Management plans discussed with the patient, family and they are in agreement.  CODE STATUS: full  TOTAL TIME TAKING CARE OF THIS PATIENT: 40 minutes.     POSSIBLE D/C IN 1-3 DAYS, DEPENDING ON CLINICAL CONDITION.   Avel Peace  M.D on 03/13/2018   Between 7am to 6pm - Pager - [phone]  After 6pm go to [email or web address] - password [email or web address] [facility]  Office  [phone]  CC: Primary care physician; [name], MD  Note: This dictation was prepared with Dragon dictation along with smaller phrase technology. Any transcriptional errors that result from this process are unintentional.

## 2018-03-13 NOTE — Progress Notes (Signed)
Rechecked temperature and was 98.5 hold tylenol.

## 2018-03-13 NOTE — Progress Notes (Signed)
Pharmacy Electrolyte Monitoring Consult:  Pharmacy consulted to assist in monitoring and replacing electrolytes in this 67 y.o. male admitted on 03/07/2018 with alcohol withdrawal.    Labs:  Sodium (mmol/L)  Date Value  03/13/2018 133 (L)   Potassium (mmol/L)  Date Value  03/13/2018 3.2 (L)   Magnesium (mg/dL)  Date Value  03/13/2018 1.6 (L)   Phosphorus (mg/dL)  Date Value  03/12/2018 3.3   Calcium (mg/dL)  Date Value  03/13/2018 8.2 (L)   Albumin (g/dL)  Date Value  03/07/2018 4.4    Plan: 5/10: Mg: 1.6, K: 3.2 Will replace with Mag sulfate 2g IV x 1 dose and KCL 33mq POx 1 dose.   Pharmacy will continue to monitor and replace per consult.   SPernell Dupre PharmD, BCPS Clinical Pharmacist 03/13/2018 8:25 AM

## 2018-03-13 NOTE — Progress Notes (Signed)
Chart reviewed. Pt has been lethargic, not appropriate for Po's. Will f/u when more alert. Strict aspiration precautions with honey thick BY TSP when alert per MBSS.

## 2018-03-14 LAB — BASIC METABOLIC PANEL
ANION GAP: 3 — AB (ref 5–15)
BUN: <5 mg/dL — ABNORMAL LOW (ref 6–20)
CALCIUM: 8.2 mg/dL — AB (ref 8.9–10.3)
CO2: 26 mmol/L (ref 22–32)
Chloride: 106 mmol/L (ref 101–111)
Creatinine, Ser: 0.35 mg/dL — ABNORMAL LOW (ref 0.61–1.24)
GFR calc Af Amer: >60 mL/min (ref >60)
GFR calc non Af Amer: >60 mL/min (ref >60)
GLUCOSE: 102 mg/dL — AB (ref 65–99)
Potassium: 3.5 mmol/L (ref 3.5–5.1)
Sodium: 135 mmol/L (ref 135–145)

## 2018-03-14 LAB — GLUCOSE, CAPILLARY
Glucose-Capillary: 94 mg/dL (ref 65–99)
Glucose-Capillary: 103 mg/dL — ABNORMAL HIGH (ref 65–99)

## 2018-03-14 LAB — MAGNESIUM: Magnesium: 1.7 mg/dL (ref 1.7–2.4)

## 2018-03-14 MED ORDER — CHLORDIAZEPOXIDE HCL 25 MG PO CAPS
25.0000 mg | ORAL_CAPSULE | Freq: Two times a day (BID) | ORAL | Status: DC
  Administered 2018-03-14 – 2018-03-16 (×5): 25 mg via ORAL
  Filled 2018-03-14 (×5): qty 1

## 2018-03-14 MED ORDER — BACLOFEN 10 MG PO TABS
10.0000 mg | ORAL_TABLET | Freq: Two times a day (BID) | ORAL | Status: DC
  Administered 2018-03-14 – 2018-03-16 (×5): 10 mg via ORAL
  Filled 2018-03-14 (×6): qty 1

## 2018-03-14 NOTE — Progress Notes (Signed)
Pharmacy Electrolyte Monitoring Consult:  Pharmacy consulted to assist in monitoring and replacing electrolytes in this 67 y.o. male admitted on 03/07/2018 with alcohol withdrawal.   5/12 K = 3.5, Mag = 1.7, Na = 135  Labs:  Sodium (mmol/L)  Date Value  03/14/2018 135   Potassium (mmol/L)  Date Value  03/14/2018 3.5   Magnesium (mg/dL)  Date Value  03/14/2018 1.7   Phosphorus (mg/dL)  Date Value  03/12/2018 3.3   Calcium (mg/dL)  Date Value  03/14/2018 8.2 (L)   Albumin (g/dL)  Date Value  03/07/2018 4.4    Plan: Electrolytes WNL, no additional supplementation is needed. Will follow up on AM labs.  Pharmacy will continue to monitor and replace per consult.   Paulina Fusi, PharmD, BCPS 03/14/2018 10:10 AM

## 2018-03-14 NOTE — Evaluation (Signed)
Physical Therapy Evaluation Patient Details Name: Douglas Peterson MRN: [id number] DOB: December 05, 1950 Today's Date: 03/14/2018   History of Present Illness  [name] a 67 year old male admitted from [facility] with delerium tremens and hyperkalemia.  PMH includes weakness of R arm, hyponatremia, Htn and alcohol abuse.  Clinical Impression  Pt is a 67 year old male who lives in a one story home alone.  He is difficult to understand due to hypophonia but states that he was able to walk without an AD prior to admission.  Pt is max A for bed mobility. He is unable to initiate movement in his R UE, which remains flexed at his torso.  All other limbs presents with generalized weakness.  Pt requires intermittent assistance for correction of posture while sitting at EOB.  Pt attempted STS but required +2 total assist to transfer to chair.  He will benefit from skilled PT with focus on strength, tolerance to activity, balance and functional mobility.    Follow Up Recommendations SNF(Pt states that he does not want to go to SNF but that he would prefer [facility] if necessary because be has been there in the past.)    Equipment Recommendations  None recommended by PT(TBD at next venue of care.)    Recommendations for Other Services       Precautions / Restrictions Precautions Precautions: Fall Restrictions Weight Bearing Restrictions: No      Mobility  Bed Mobility Overal bed mobility: Needs Assistance Bed Mobility: Supine to Sit     Supine to sit: Max assist;HOB elevated     General bed mobility comments: Pt attempted to  use chair to sit up with PT blocking pt's feet on floor.  He eventually agreed to let PT assist him into an upright position.  Transfers Overall transfer level: Needs assistance Equipment used: 2 person hand held assist Transfers: Squat Pivot Transfers;Stand Pivot Transfers   Stand pivot transfers: Max assist Squat pivot transfers: Total assist     General transfer comment:  Attempted standing pivot and pt was able to assist minimally in standing but experienced retropulsion in standing.  Max A to correct.  PT and NA performed total A squat pivot to recliner.  Ambulation/Gait Ambulation/Gait assistance: (Unable to perform at this time.)              Stairs            Wheelchair Mobility    Modified Rankin (Stroke Patients Only)       Balance Overall balance assessment: Needs assistance Sitting-balance support: Bilateral upper extremity supported;Feet supported Sitting balance-Leahy Scale: Poor   Postural control: Posterior lean Standing balance support: Bilateral upper extremity supported                                 Pertinent Vitals/Pain Pain Assessment: No/denies pain    Home Living Family/patient expects to be discharged to:: Private residence Living Arrangements: Alone Available Help at Discharge: Family Type of Home: House Home Access: Stairs to enter   Technical brewer of Steps: 2          Prior Function                 Hand Dominance        Extremity/Trunk Assessment   Upper Extremity Assessment Upper Extremity Assessment: Generalized weakness(Pt is not able to initiate movement in his R UE functionally for for MMT.  L UE  grossly 4-/5)    Lower Extremity Assessment Lower Extremity Assessment: Generalized weakness    Cervical / Trunk Assessment Cervical / Trunk Assessment: Kyphotic  Communication   Communication: Other (comment);Expressive difficulties(Pt is very difficult to understanding when pt is talking.)  Cognition Arousal/Alertness: Awake/alert Behavior During Therapy: WFL for tasks assessed/performed Overall Cognitive Status: Within Functional Limits for tasks assessed                                        General Comments      Exercises     Assessment/Plan    PT Assessment Patient needs continued PT services  PT Problem List Decreased  strength;Decreased mobility;Decreased balance;Decreased knowledge of use of DME;Decreased activity tolerance       PT Treatment Interventions DME instruction;Therapeutic activities;Gait training;Therapeutic exercise;Patient/family education;Stair training;Balance training;Functional mobility training;Neuromuscular re-education    PT Goals (Current goals can be found in the Care Plan section)  Acute Rehab PT Goals Patient Stated Goal: To return home as soon as possible. PT Goal Formulation: With patient Time For Goal Achievement: 03/14/18 Potential to Achieve Goals: Fair    Frequency Min 2X/week   Barriers to discharge        Co-evaluation               AM-PAC PT "6 Clicks" Daily Activity  Outcome Measure Difficulty turning over in bed (including adjusting bedclothes, sheets and blankets)?: A Lot Difficulty moving from lying on back to sitting on the side of the bed? : A Lot Difficulty sitting down on and standing up from a chair with arms (e.g., wheelchair, bedside commode, etc,.)?: Unable Help needed moving to and from a bed to chair (including a wheelchair)?: Total Help needed walking in hospital room?: Total Help needed climbing 3-5 steps with a railing? : Total 6 Click Score: 8    End of Session Equipment Utilized During Treatment: Gait belt Activity Tolerance: Patient limited by fatigue Patient left: in chair;with call bell/phone within reach;with chair alarm set Nurse Communication: Mobility status PT Visit Diagnosis: Unsteadiness on feet (R26.81);Muscle weakness (generalized) (M62.81)    Time: 1500-1530 PT Time Calculation (min) (ACUTE ONLY): 30 min   Charges:   PT Evaluation $PT Eval Moderate Complexity: 1 Mod     PT G Codes:   PT G-Codes **NOT FOR INPATIENT CLASS** Functional Assessment Tool Used: AM-PAC 6 Clicks Basic Mobility   Roxanne Gates, PT, DPT   Roxanne Gates 03/14/2018, 3:43 PM

## 2018-03-14 NOTE — Progress Notes (Signed)
[facility] [place] NAME: Douglas Peterson    MR#:  [id number]  DATE OF BIRTH:  1951-04-19  SUBJECTIVE:  CHIEF COMPLAINT:   Chief Complaint  Patient presents with  . Diarrhea  . Weakness  The patient's family has now convinced the patient to accept placement to skilled nursing facility, await physical therapy recommendations  REVIEW OF SYSTEMS:  CONSTITUTIONAL: No fever, fatigue or weakness.  EYES: No blurred or double vision.  EARS, NOSE, AND THROAT: No tinnitus or ear pain.  RESPIRATORY: No cough, shortness of breath, wheezing or hemoptysis.  CARDIOVASCULAR: No chest pain, orthopnea, edema.  GASTROINTESTINAL: No nausea, vomiting, diarrhea or abdominal pain.  GENITOURINARY: No dysuria, hematuria.  ENDOCRINE: No polyuria, nocturia,  HEMATOLOGY: No anemia, easy bruising or bleeding SKIN: No rash or lesion. MUSCULOSKELETAL: No joint pain or arthritis.   NEUROLOGIC: No tingling, numbness, weakness.  PSYCHIATRY: No anxiety or depression.   ROS  DRUG ALLERGIES:  No Known Allergies  VITALS:  Blood pressure 123/77, pulse (!) 53, temperature 98.5 F (36.9 C), temperature source Oral, resp. rate 18, height 5' 3"  (1.6 m), weight 48.1 kg (106 lb), SpO2 95 %.  PHYSICAL EXAMINATION:  GENERAL:  67 y.o.-year-old patient lying in the bed with no acute distress.  EYES: Pupils equal, round, reactive to light and accommodation. No scleral icterus. Extraocular muscles intact.  HEENT: Head atraumatic, normocephalic. Oropharynx and nasopharynx clear.  NECK:  Supple, no jugular venous distention. No thyroid enlargement, no tenderness.  LUNGS: Normal breath sounds bilaterally, no wheezing, rales,rhonchi or crepitation. No use of accessory muscles of respiration.  CARDIOVASCULAR: S1, S2 normal. No murmurs, rubs, or gallops.  ABDOMEN: Soft, nontender, nondistended. Bowel sounds present. No organomegaly or mass.  EXTREMITIES: No pedal edema, cyanosis, or  clubbing.  NEUROLOGIC: Cranial nerves II through XII are intact. Muscle strength 5/5 in all extremities. Sensation intact. Gait not checked.  PSYCHIATRIC: The patient is alert and oriented x 3.  SKIN: No obvious rash, lesion, or ulcer.   Physical Exam LABORATORY PANEL:   CBC Recent Labs  Lab 03/10/18 0432  WBC 4.1  HGB 13.4  HCT 38.1*  PLT 61*   ------------------------------------------------------------------------------------------------------------------  Chemistries  Recent Labs  Lab 03/14/18 0504  NA 135  K 3.5  CL 106  CO2 26  GLUCOSE 102*  BUN <5*  CREATININE 0.35*  CALCIUM 8.2*  MG 1.7   ------------------------------------------------------------------------------------------------------------------  Cardiac Enzymes No results for input(s): TROPONINI in the last 168 hours. ------------------------------------------------------------------------------------------------------------------  RADIOLOGY:  Dg Chest 1 View  Result Date: 03/12/2018 CLINICAL DATA:  Lethargy. EXAM: CHEST  1 VIEW COMPARISON:  03/07/2018. FINDINGS: Normal heart size, stable from priors. No consolidation or edema. Poor inspiratory effort. Mild blunting of the RIGHT CP angle appears stable. Skeletal osteopenia. IMPRESSION: Low lung volumes.  No active disease. Electronically Signed   By: Staci Righter M.D.   On: 03/12/2018 23:50    ASSESSMENT AND PLAN:  67 year old male patient with history of hypertension, alcohol abuse was admitted for alcohol intoxication with a level greater than 300 and change in mental status along with atrial fibrillation on amiodarone drip.  -Dysphagia, acute on chronic Stable Continue pured and honey thickened liquid, speech therapy input appreciated, continue aspiration/fall precautions, palliative care following Case discussed with the patient's sister as well as daughter, patient now wanting to be placed in skilled nursing facility per family  -Acute  metabolic encephalopathy  Resolved  At neurologic baseline per sister, but remains to have  severe deconditioning-physical therapy to see  Secondary to alcohol intoxication as well as alcohol withdrawal Continue alcohol withdrawal protocol  -Alcoholic dementia Stable At neurologic baseline per sister Psychiatry input appreciated-patient does have capacity, continue Librium and baclofen consult psychiatry to determine capacity as patient wants to go home but family states that he is best to be placed in the facility  -Moderate to severe malnutrition Dietary consulted  -Hypokalemia Replaced  -Atrial fibrillation Off amiodarone drip Continue Cardizem     All the records are reviewed and case discussed with Care Management/Social Workerr. Management plans discussed with the patient, family and they are in agreement.  CODE STATUS: DNR  TOTAL TIME TAKING CARE OF THIS PATIENT: 35 minutes.     POSSIBLE D/C IN 1-3 DAYS, DEPENDING ON CLINICAL CONDITION.   Avel Peace  M.D on 03/14/2018   Between 7am to 6pm - Pager - [phone]  After 6pm go to [email or web address] - password [email or web address] [facility]  Office  [phone]  CC: Primary care physician; [name], MD  Note: This dictation was prepared with Dragon dictation along with smaller phrase technology. Any transcriptional errors that result from this process are unintentional.

## 2018-03-14 NOTE — Progress Notes (Signed)
Cannot d/c I.V.C. order on this patient at this time per Dr. Davonna Belling (psychiatry). Thus sitter is mandatory. Wenda Low [facility]

## 2018-03-14 NOTE — Plan of Care (Signed)
  Problem: Education: Goal: Knowledge of General Education information will improve Outcome: Not Progressing Note:  Patient's neurological capacity not sufficient to complete this goal today. Will continue to monitor status. Wenda Low [facility]

## 2018-03-14 NOTE — Progress Notes (Signed)
CIWA assessment not completed at this time. Patient sleeping. Will re-assess in 6 hours. Wenda Low [facility]

## 2018-03-15 ENCOUNTER — Inpatient Hospital Stay: Payer: Medicare Other

## 2018-03-15 DIAGNOSIS — F10231 Alcohol dependence with withdrawal delirium: Principal | ICD-10-CM

## 2018-03-15 LAB — BASIC METABOLIC PANEL
Anion gap: 7 (ref 5–15)
BUN: 5 mg/dL — ABNORMAL LOW (ref 6–20)
CHLORIDE: 105 mmol/L (ref 101–111)
CO2: 27 mmol/L (ref 22–32)
Calcium: 8.5 mg/dL — ABNORMAL LOW (ref 8.9–10.3)
Creatinine, Ser: 0.37 mg/dL — ABNORMAL LOW (ref 0.61–1.24)
GFR calc non Af Amer: >60 mL/min (ref >60)
Glucose, Bld: 96 mg/dL (ref 65–99)
POTASSIUM: 3.7 mmol/L (ref 3.5–5.1)
SODIUM: 139 mmol/L (ref 135–145)

## 2018-03-15 LAB — GLUCOSE, CAPILLARY
Glucose-Capillary: 104 mg/dL — ABNORMAL HIGH (ref 65–99)
Glucose-Capillary: 107 mg/dL — ABNORMAL HIGH (ref 65–99)
Glucose-Capillary: 100 mg/dL — ABNORMAL HIGH (ref 65–99)

## 2018-03-15 LAB — MAGNESIUM: MAGNESIUM: 1.6 mg/dL — AB (ref 1.7–2.4)

## 2018-03-15 LAB — PHOSPHORUS: Phosphorus: 4.6 mg/dL (ref 2.5–4.6)

## 2018-03-15 MED ORDER — MAGNESIUM SULFATE 2 GM/50ML IV SOLN
2.0000 g | Freq: Once | INTRAVENOUS | Status: AC
  Administered 2018-03-15: 2 g via INTRAVENOUS
  Filled 2018-03-15: qty 50

## 2018-03-15 NOTE — Progress Notes (Signed)
[facility] [place] NAME: Geovani Tootle    MR#:  [id number]  DATE OF BIRTH:  Jun 24, 1951  SUBJECTIVE:  CHIEF COMPLAINT:   Chief Complaint  Patient presents with  . Diarrhea  . Weakness  Awaiting placement  REVIEW OF SYSTEMS:  CONSTITUTIONAL: No fever, fatigue or weakness.  EYES: No blurred or double vision.  EARS, NOSE, AND THROAT: No tinnitus or ear pain.  RESPIRATORY: No cough, shortness of breath, wheezing or hemoptysis.  CARDIOVASCULAR: No chest pain, orthopnea, edema.  GASTROINTESTINAL: No nausea, vomiting, diarrhea or abdominal pain.  GENITOURINARY: No dysuria, hematuria.  ENDOCRINE: No polyuria, nocturia,  HEMATOLOGY: No anemia, easy bruising or bleeding SKIN: No rash or lesion. MUSCULOSKELETAL: No joint pain or arthritis.   NEUROLOGIC: No tingling, numbness, weakness.  PSYCHIATRY: No anxiety or depression.   ROS  DRUG ALLERGIES:  No Known Allergies  VITALS:  Blood pressure (!) 161/92, pulse 74, temperature 98.1 F (36.7 C), temperature source Oral, resp. rate 16, height 5' 3"  (1.6 m), weight 50.8 kg (112 lb), SpO2 98 %.  PHYSICAL EXAMINATION:  GENERAL:  67 y.o.-year-old patient lying in the bed with no acute distress.  EYES: Pupils equal, round, reactive to light and accommodation. No scleral icterus. Extraocular muscles intact.  HEENT: Head atraumatic, normocephalic. Oropharynx and nasopharynx clear.  NECK:  Supple, no jugular venous distention. No thyroid enlargement, no tenderness.  LUNGS: Normal breath sounds bilaterally, no wheezing, rales,rhonchi or crepitation. No use of accessory muscles of respiration.  CARDIOVASCULAR: S1, S2 normal. No murmurs, rubs, or gallops.  ABDOMEN: Soft, nontender, nondistended. Bowel sounds present. No organomegaly or mass.  EXTREMITIES: No pedal edema, cyanosis, or clubbing.  NEUROLOGIC: Cranial nerves II through XII are intact. Muscle strength 5/5 in all extremities. Sensation  intact. Gait not checked.  PSYCHIATRIC: The patient is alert and oriented x 3.  SKIN: No obvious rash, lesion, or ulcer.   Physical Exam LABORATORY PANEL:   CBC Recent Labs  Lab 03/10/18 0432  WBC 4.1  HGB 13.4  HCT 38.1*  PLT 61*   ------------------------------------------------------------------------------------------------------------------  Chemistries  Recent Labs  Lab 03/15/18 0427  NA 139  K 3.7  CL 105  CO2 27  GLUCOSE 96  BUN 5*  CREATININE 0.37*  CALCIUM 8.5*  MG 1.6*   ------------------------------------------------------------------------------------------------------------------  Cardiac Enzymes No results for input(s): TROPONINI in the last 168 hours. ------------------------------------------------------------------------------------------------------------------  RADIOLOGY:  No results found.  ASSESSMENT AND PLAN:  67 year old male patient with history of hypertension, alcohol abuse was admitted for alcohol intoxication with a level greater than 300 and change in mental status along with atrial fibrillation on amiodarone drip.  -Dysphagia,acute on chronic Stable Continuepured and honey thickened liquid, speech therapy following, continue aspiration/fall precautions,palliative care consulted Case discussed with the patient's sister as well as daughter, patient now wanting to be placed in skilled nursing facility-awaiting placement   -Acute metabolic encephalopathy Resolved  At neurologic baseline per sister, but remains to have severe deconditioning-physical therapy recommending skilled nursing facility  Secondary to alcohol intoxicationas well as alcohol withdrawal Treated on alcohol withdrawal protocol  -Alcoholic dementia Stable At neurologic baseline per sister Psychiatry input appreciated-patient does have capacity, continue Librium and baclofen, patient does not require inpatient psychiatry admission per psychiatry,  discontinue IVC   -Moderate to severe malnutrition Dietary consulted  -Hypokalemia Replaced  -Atrial fibrillation Off amiodarone drip Continue Cardizem  Disposition to skilled nursing facility when bed is available barring any complications  All the records are  reviewed and case discussed with Care Management/Social Workerr. Management plans discussed with the patient, family and they are in agreement.  CODE STATUS: DNR  TOTAL TIME TAKING CARE OF THIS PATIENT: 35 minutes.     POSSIBLE D/C IN 1-3 DAYS, DEPENDING ON CLINICAL CONDITION.   Avel Peace Salary M.D on 03/15/2018   Between 7am to 6pm - Pager - [phone]  After 6pm go to [email or web address] - password [email or web address] [facility]  Office  [phone]  CC: Primary care physician; [name], MD  Note: This dictation was prepared with Dragon dictation along with smaller phrase technology. Any transcriptional errors that result from this process are unintentional.

## 2018-03-15 NOTE — Progress Notes (Signed)
SLP Cancellation Note  Patient Details Name: Douglas Peterson MRN: [id number] DOB: [birth year]   Cancelled treatment:       Reason Eval/Treat Not Completed: (pt resting after dinner meal). Pt is appearing to tolerate the dysphagia diet recommended post MBSS last week. Pt has been lethargic but more awake/alert per MD today. Discussed pt's status w/ MD and the results of the MBSS indicating that pt has SILENT aspiration and is NOT recommended for diet consistency upgrade at this time d/t his increased risk for aspiration d/t his degree of oropharyngeal phase dysphagia, declined Cognitive status.  ST services will f/u for further education w/ family and staff as needed; toleration of diet while admitted. NSG updated. MD/NSG agreed.    Orinda Kenner, [place], CCC-SLP [name],[name] 03/15/2018, 6:17 PM

## 2018-03-15 NOTE — Progress Notes (Signed)
IVC sitter discontinued, pt on low bed, bed alarm in place.

## 2018-03-15 NOTE — Plan of Care (Signed)
  Problem: Clinical Measurements: Goal: Will remain free from infection Outcome: Progressing   Problem: Pain Managment: Goal: General experience of comfort will improve Outcome: Progressing   Problem: Safety: Goal: Ability to remain free from injury will improve Outcome: Progressing   

## 2018-03-15 NOTE — Progress Notes (Addendum)
Pt bed alarm went off, staff entered room to discover pt kneeling on the floor, no evident injury. Pt assisted back to bed. Pt complaining he "hurt his lt arm".  VSS, Dr. Jerelyn Charles notified of pt fall, Dr. Jerelyn Charles to order xray lt arm.

## 2018-03-15 NOTE — Care Management Important Message (Signed)
Copy of signed IM left in patient's room.    

## 2018-03-15 NOTE — NC FL2 (Signed)
[place] LEVEL OF CARE SCREENING TOOL     IDENTIFICATION  Patient Name: Douglas Peterson Birthdate: [birth year] Sex: male Admission Date (Current Location): 03/07/2018  [place] and [place] Number:  Engineering geologist and Address:  [facility], [address] [place], [place] [address]      Provider Number: [id number]  Attending Physician Name and Address:  Gorden Harms, MD  Relative Name and Phone Number:  [facility] Daughter   [phone] or [place], [place] Sister   [phone]     Current Level of Care: Hospital Recommended Level of Care: [place] Prior Approval Number:    Date Approved/Denied:   PASRR Number: [id number]  Discharge Plan: SNF    Current Diagnoses: Patient Active Problem List   Diagnosis Date Noted  . Delirium tremens ([place]) 03/08/2018  . Malnutrition of moderate degree 11/24/2017  . HTN (hypertension) 09/16/2017  . Hypothermia 12/17/2016  . Alcohol use   . Diarrhea   . Hyponatremia 07/09/2015    Orientation RESPIRATION BLADDER Height & Weight     Self, Time, Place  Normal Incontinent Weight: 112 lb (50.8 kg) Height:  5' 3"  (160 cm)  BEHAVIORAL SYMPTOMS/MOOD NEUROLOGICAL BOWEL NUTRITION STATUS      Continent Diet(Pureed dysphagia 1 diet.)  AMBULATORY STATUS COMMUNICATION OF NEEDS Skin   Limited Assist Verbally Normal                       Personal Care Assistance Level of Assistance  Bathing, Feeding, Dressing Bathing Assistance: Limited assistance Feeding assistance: Limited assistance Dressing Assistance: Limited assistance     Functional Limitations Info  Sight, Hearing, Speech Sight Info: Adequate Hearing Info: Adequate Speech Info: Adequate    SPECIAL CARE FACTORS FREQUENCY  PT (By licensed PT)     PT Frequency: 5x a week              Contractures Contractures Info: Not present    Additional Factors Info  Code Status, Allergies Code Status  Info: DNR Allergies Info: NKA           Current Medications (03/15/2018):  This is the current hospital active medication list Current Facility-Administered Medications  Medication Dose Route Frequency Provider Last Rate Last Dose  . acetaminophen (TYLENOL) tablet 650 mg  650 mg Oral Q6H PRN Harrie Foreman, MD   650 mg at 03/14/18 2241   Or  . acetaminophen (TYLENOL) suppository 650 mg  650 mg Rectal Q6H PRN Harrie Foreman, MD      . aspirin EC tablet 81 mg  81 mg Oral Daily Harrie Foreman, MD   81 mg at 03/15/18 0900  . atenolol (TENORMIN) tablet 25 mg  25 mg Oral QHS Harrie Foreman, MD   25 mg at 03/14/18 2055  . baclofen (LIORESAL) tablet 10 mg  10 mg Oral BID Salary, Montell D, MD   10 mg at 03/15/18 0900  . chlordiazePOXIDE (LIBRIUM) capsule 25 mg  25 mg Oral BID Salary, Montell D, MD   25 mg at 03/15/18 0900  . diltiazem (CARDIZEM CD) 24 hr capsule 120 mg  120 mg Oral BID Saundra Shelling, MD   120 mg at 03/15/18 0900  . docusate sodium (COLACE) capsule 100 mg  100 mg Oral BID PRN Harrie Foreman, MD      . folic acid (FOLVITE) tablet 1 mg  1 mg Oral Daily Pyreddy, Pavan, MD   1 mg at 03/15/18 0900  .  hydrALAZINE (APRESOLINE) injection 10-20 mg  10-20 mg Intravenous Q4H PRN Awilda Bill, NP   20 mg at 03/12/18 0815  . labetalol (NORMODYNE,TRANDATE) injection 10 mg  10 mg Intravenous Q4H PRN Sudini, Alveta Heimlich, MD      . lisinopril (PRINIVIL,ZESTRIL) tablet 20 mg  20 mg Oral Daily Harrie Foreman, MD   20 mg at 03/15/18 0900  . magnesium oxide (MAG-OX) tablet 400 mg  400 mg Oral Daily Salary, Montell D, MD   400 mg at 03/15/18 0900  . MEDLINE mouth rinse  15 mL Mouth Rinse BID Conforti, John, DO   15 mL at 03/15/18 0907  . multivitamin (RENA-VIT) tablet 1 tablet  1 tablet Oral QHS Hillary Bow, MD   1 tablet at 03/14/18 2055  . multivitamin with minerals tablet 1 tablet  1 tablet Oral Daily Flora Lipps, MD   1 tablet at 03/15/18 0900  . ondansetron (ZOFRAN)  tablet 4 mg  4 mg Oral Q6H PRN Harrie Foreman, MD       Or  . ondansetron [facility]) injection 4 mg  4 mg Intravenous Q6H PRN Harrie Foreman, MD   4 mg at 03/14/18 5027  . polyethylene glycol (MIRALAX / GLYCOLAX) packet 17 g  17 g Oral Daily Harrie Foreman, MD   17 g at 03/15/18 0858  . thiamine (B-1) injection 100 mg  100 mg Intravenous TID Salary, Holly Bodily D, MD   100 mg at 03/15/18 0900  . [START ON 03/16/2018] thiamine (VITAMIN B-1) tablet 100 mg  100 mg Oral Daily Salary, Montell D, MD         Discharge Medications: Please see discharge summary for a list of discharge medications.  Relevant Imaging Results:  Relevant Lab Results:   Additional Information SSN [id number]  Ross Ludwig, [place]

## 2018-03-15 NOTE — Progress Notes (Signed)
Daughter [name] called and updated on pt's fall and further plan.

## 2018-03-15 NOTE — Plan of Care (Signed)
  Problem: Clinical Measurements: Goal: Will remain free from infection Outcome: Progressing Note:  Remains afebrile Goal: Cardiovascular complication will be avoided Outcome: Progressing Note:  No arrhythmias    Problem: Safety: Goal: Ability to remain free from injury will improve Outcome: Progressing Note:  1:1 sitter present, no s/s DT's   Problem: Clinical Measurements: Goal: Diagnostic test results will improve Note:  MG 1.6, replace with IV today

## 2018-03-15 NOTE — Progress Notes (Addendum)
Pt asked me to dial "[name]" for him.  Pt called me back in stated "wendy wants to talk to you".  Spoke with daughter updated her on dc of IVC and sitter to facilitate movement to SNF.  Daughter states pt told her he fell and hurt his arm. Explained pt had been in bed all day with sitter at bedside and had not fallen.
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
## 2018-03-15 NOTE — Progress Notes (Signed)
Spoke with Dr. Bary Leriche regarding discontinuing IVC and sitter, orders received.

## 2018-03-15 NOTE — Care Management (Signed)
This morning was informed that there has been disagreement between patient and family regarding discharging to skilled nursing facility  and patient's desire to discharge home. Discussed with attending that psych note from 5/11 did not address capacity, nor did it address continued IVC order.  CM discussed that patient could not discharge to a facility until without a sitter for 24 hours. Psych did see patient this afternoon and discontinued the IVC order and the sitter has been discontinued.  Patient has verbalized to CM that he is agreeable to discharge to SNF.  [name] not able to stand, is not able to feed himself.  If patient changes his mind and insists on going home, psych will need to address capacity.  Psych discontinued IVC and sitter around 2:46pm.

## 2018-03-15 NOTE — Clinical Social Work Note (Signed)
Clinical Social Work Assessment  Patient Details  Name: Douglas Peterson MRN: [id number] Date of Birth: [birth year]  Date of referral:  03/15/18               Reason for consult:  Facility Placement                Permission sought to share information with:  Family Supports, Customer service manager Permission granted to share information::  Yes, Verbal Permission Granted  Name::     Mallon,Wendy Daughter [phone] or Kiwan, [place] Sister   [phone]   Agency::  SNF admissions  Relationship::     Contact Information:     Housing/Transportation Living arrangements for the past 2 months:  Single Family Home Source of Information:  Patient, Adult Children Patient Interpreter Needed:  None Criminal Activity/Legal Involvement Pertinent to Current Situation/Hospitalization:  No - Comment as needed Significant Relationships:  Adult Children, Other Family Members Lives with:  Self Do you feel safe going back to the place where you live?  No Need for family participation in patient care:  Yes (Comment)  Care giving concerns:  Patient and family feel he needs some short term rehab before he is able to return back home.   Social Worker assessment / plan:  [name] a 67 year old male who is alert and oriented x2.  Patient has some confusion, CSW spoke to patient's daughter to complete assessment.  Patient's daughter states that he has been at [facility] in the past, and is pleased with the care there.  Patient's daughter was explained the process for looking for placement and how insurance will pay for the stay at SNF.  CSW discussed with patient's daughter that if she has concerns she should speak with DSS and the social worker at [facility] about looking to get a guardian for patient.  Patient's daughter gave CSW permission to begin bed search in [place].  Patient's daughter stated she would like patient to go to [facility] if possible again.  CSW was  given permission to begin bed search in [facility].  Patient's daughter did not have any other questions.  Employment status:  Retired Forensic scientist:  Medicare PT Recommendations:  [place] / Referral to community resources:  [place]  Patient/Family's Response to care:  Patient and daughter are greeable to going to SNF for short term rehab.  Patient/Family's Understanding of and Emotional Response to Diagnosis, Current Treatment, and Prognosis: Patient and family are hopeful that patient will do well at SNF, and he can return back home.  Emotional Assessment Appearance:  Appears older than stated age Attitude/Demeanor/Rapport:    Affect (typically observed):  Appropriate, Calm Orientation:  Oriented to Self, Oriented to Place Alcohol / Substance use:  Alcohol Use Psych involvement (Current and /or in the community):  Yes (Comment)  Discharge Needs  Concerns to be addressed:  Lack of Support Readmission within the last 30 days:  No Current discharge risk:  Substance Abuse, Lack of support system, Cognitively Impaired Barriers to Discharge:  Continued Medical Work up   Anell Barr 03/15/2018, 5:38 PM
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
## 2018-03-15 NOTE — Progress Notes (Addendum)
Pharmacy Electrolyte Monitoring Consult:  Pharmacy consulted to assist in monitoring and replacing electrolytes in this 67 y.o. male admitted on 03/07/2018 with alcohol withdrawal.   5/12 K = 3.7, Mag = 1.6, Na = 139  Labs:  Sodium (mmol/L)  Date Value  03/15/2018 139   Potassium (mmol/L)  Date Value  03/15/2018 3.7   Magnesium (mg/dL)  Date Value  03/15/2018 1.6 (L)   Phosphorus (mg/dL)  Date Value  03/12/2018 3.3   Calcium (mg/dL)  Date Value  03/15/2018 8.5 (L)   Albumin (g/dL)  Date Value  03/07/2018 4.4    Plan: Will order mag sulfate 2 g IV x1 Per dietician request, checking add on phos to ensure stability (stated if WNL, likely do not need to recheck)  Recheck electrolytes in AM  Pharmacy will continue to monitor and replace per consult.   Rayna Sexton, PharmD, BCPS Clinical Pharmacist 03/15/2018 10:02 AM   Add on Phos 4.6  - no supp needed at this time.   Rayna Sexton, PharmD, BCPS Clinical Pharmacist 03/15/2018 2:39 PM

## 2018-03-15 NOTE — Progress Notes (Signed)
CH provided prayer for patient outside patient's room. Patient was in with medical team member.

## 2018-03-15 NOTE — Clinical Social Work Note (Signed)
CSW spoke with patient, and presented bed offers, he chose [facility].  [facility] contacted [facility] and [facility] can accept patient once he has been 24 hrs without a sitter.  CSW contacted patient's daughter [name] and informed her that [facility] has offered a bed, patient's daughter stated they would like him to return back there.  Patient's daughter also expressed that she does not feel like patient is able to make decisions, CSW informed her that she should contact DSS and work with the SNF to see if they can help with trying to find a guardian for patient.  CSW to continue to follow patient's progress throughout discharge planning.  Formal assessment to follow.  Jones Broom. Norval Morton, MSW, [place]  03/15/2018 4:32 PM

## 2018-03-16 ENCOUNTER — Inpatient Hospital Stay: Payer: Medicare Other

## 2018-03-16 LAB — URINALYSIS, ROUTINE W REFLEX MICROSCOPIC
Bilirubin Urine: NEGATIVE
Glucose, UA: NEGATIVE mg/dL
Hgb urine dipstick: NEGATIVE
KETONES UR: NEGATIVE mg/dL
LEUKOCYTES UA: NEGATIVE
NITRITE: NEGATIVE
PROTEIN: NEGATIVE mg/dL
Specific Gravity, Urine: 1.015 (ref 1.005–1.030)
pH: 7 (ref 5.0–8.0)

## 2018-03-16 LAB — BASIC METABOLIC PANEL
ANION GAP: 6 (ref 5–15)
BUN: 6 mg/dL (ref 6–20)
CALCIUM: 8.9 mg/dL (ref 8.9–10.3)
CO2: 29 mmol/L (ref 22–32)
Chloride: 97 mmol/L — ABNORMAL LOW (ref 101–111)
Creatinine, Ser: 0.57 mg/dL — ABNORMAL LOW (ref 0.61–1.24)
GFR calc non Af Amer: >60 mL/min (ref >60)
Glucose, Bld: 106 mg/dL — ABNORMAL HIGH (ref 65–99)
POTASSIUM: 3.8 mmol/L (ref 3.5–5.1)
Sodium: 132 mmol/L — ABNORMAL LOW (ref 135–145)

## 2018-03-16 LAB — MAGNESIUM: Magnesium: 1.8 mg/dL (ref 1.7–2.4)

## 2018-03-16 MED ORDER — DILTIAZEM HCL ER COATED BEADS 120 MG PO CP24: 120.0000 mg | ORAL_CAPSULE | Freq: Two times a day (BID) | ORAL | 0 refills | Status: DC

## 2018-03-16 MED ORDER — AMOXICILLIN-POT CLAVULANATE 875-125 MG PO TABS: 1.0000 | ORAL_TABLET | Freq: Two times a day (BID) | ORAL | 0 refills | Status: DC

## 2018-03-16 MED ORDER — AMOXICILLIN-POT CLAVULANATE 875-125 MG PO TABS: 1.0000 | ORAL_TABLET | Freq: Two times a day (BID) | ORAL | 0 refills | Status: AC

## 2018-03-16 NOTE — Progress Notes (Addendum)
Called facility and gave nurse-to-nurse report at this time. All questions answered. [facility] for non-emergent patient transport to the receiving facility at this time. Wenda Low [facility]
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
## 2018-03-16 NOTE — Progress Notes (Signed)
[name] temp w/ AM vitals 103. 634m PRN PO Tylenol given, cold washcloth applied to forehead, ice pack applied to back of neck, extra blankets removed, and room temperature turned down. Will recheck temp in 1 hour. KEarleen Reaper RN

## 2018-03-16 NOTE — Discharge Instructions (Signed)
Diarrhea, Adult Diarrhea is when you have loose and water poop (stool) often. Diarrhea can make you feel weak and cause you to get dehydrated. Dehydration can make you tired and thirsty, make you have a dry mouth, and make it so you pee (urinate) less often. Diarrhea often lasts 2-3 days. However, it can last longer if it is a sign of something more serious. It is important to treat your diarrhea as told by your doctor. Follow these instructions at home: Eating and drinking  Follow these recommendations as told by your doctor:  Take an oral rehydration solution (ORS). This is a drink that is sold at pharmacies and stores.  Drink clear fluids, such as: ? Water. ? Ice chips. ? Diluted fruit juice. ? Low-calorie sports drinks.  Eat bland, easy-to-digest foods in small amounts as you are able. These foods include: ? Bananas. ? Applesauce. ? Rice. ? Low-fat (lean) meats. ? Toast. ? Crackers.  Avoid drinking fluids that have a lot of sugar or caffeine in them.  Avoid alcohol.  Avoid spicy or fatty foods.  General instructions   Drink enough fluid to keep your pee (urine) clear or pale yellow.  Wash your hands often. If you cannot use soap and water, use hand sanitizer.  Make sure that all people in your home wash their hands well and often.  Take over-the-counter and prescription medicines only as told by your doctor.  Rest at home while you get better.  Watch your condition for any changes.  Take a warm bath to help with any burning or pain from having diarrhea.  Keep all follow-up visits as told by your doctor. This is important. Contact a doctor if:  You have a fever.  Your diarrhea gets worse.  You have new symptoms.  You cannot keep fluids down.  You feel light-headed or dizzy.  You have a headache.  You have muscle cramps. Get help right away if:  You have chest pain.  You feel very weak or you pass out (faint).  You have bloody or black poop or  poop that look like tar.  You have very bad pain, cramping, or bloating in your belly (abdomen).  You have trouble breathing or you are breathing very quickly.  Your heart is beating very quickly.  Your skin feels cold and clammy.  You feel confused.  You have signs of dehydration, such as: ? Dark pee, hardly any pee, or no pee. ? Cracked lips. ? Dry mouth. ? Sunken eyes. ? Sleepiness. ? Weakness. This information is not intended to replace advice given to you by your health care provider. Make sure you discuss any questions you have with your health care provider. Document Released: 04/07/2008 Document Revised: 05/09/2016 Document Reviewed: 06/26/2015 Elsevier Interactive Patient Education  2018 [employer].

## 2018-03-16 NOTE — Progress Notes (Signed)
Pharmacy Electrolyte Monitoring Consult:  Pharmacy consulted to assist in monitoring and replacing electrolytes in this 67 y.o. male admitted on 03/07/2018 with alcohol withdrawal.   5/12 K = 3.7, Mag = 1.6, Na = 139  Labs:  Sodium (mmol/L)  Date Value  03/16/2018 132 (L)   Potassium (mmol/L)  Date Value  03/16/2018 3.8   Magnesium (mg/dL)  Date Value  03/16/2018 1.8   Phosphorus (mg/dL)  Date Value  03/15/2018 4.6   Calcium (mg/dL)  Date Value  03/16/2018 8.9   Albumin (g/dL)  Date Value  03/07/2018 4.4    Plan: Will order mag sulfate 2 g IV x1 Per dietician request, checking add on phos to ensure stability (stated if WNL, likely do not need to recheck) Recheck electrolytes in AM  Pharmacy will continue to monitor and replace per consult.   Add on Phos 4.6  - no supp needed at this time.   Rayna Sexton, PharmD, BCPS Clinical Pharmacist 03/16/2018 8:08 AM   5/14- K 3.8  Mag 1.8   Patient on Mag-Ox 426m PO daily. No supplementation at this time. Patient on Dysphagia 1 diet. F/u labs in am.  KChinita GreenlandPharmD Clinical Pharmacist 03/16/2018

## 2018-03-22 ENCOUNTER — Other Ambulatory Visit
Admission: RE | Admit: 2018-03-22 | Discharge: 2018-03-22 | Disposition: A | Discharge status: Home / Self Care | Payer: No Typology Code available for payment source | Source: Ambulatory Visit | Attending: Family Medicine | Admitting: Family Medicine

## 2018-03-22 DIAGNOSIS — K7030 Alcoholic cirrhosis of liver without ascites: Secondary | ICD-10-CM | POA: Diagnosis present

## 2018-03-22 LAB — AMMONIA: Ammonia: 59 umol/L — ABNORMAL HIGH (ref 9–35)

## 2018-04-02 ENCOUNTER — Other Ambulatory Visit
Admission: RE | Admit: 2018-04-02 | Discharge: 2018-04-02 | Disposition: A | Discharge status: Home / Self Care | Payer: No Typology Code available for payment source | Source: Ambulatory Visit | Attending: Family Medicine | Admitting: Family Medicine

## 2018-04-02 DIAGNOSIS — K7030 Alcoholic cirrhosis of liver without ascites: Secondary | ICD-10-CM | POA: Diagnosis present

## 2018-04-02 LAB — AMMONIA: AMMONIA: 75 umol/L — AB (ref 9–35)

## 2018-04-05 ENCOUNTER — Other Ambulatory Visit
Admission: RE | Admit: 2018-04-05 | Discharge: 2018-04-05 | Disposition: A | Discharge status: Home / Self Care | Payer: Medicare Other | Source: Ambulatory Visit | Attending: Family Medicine | Admitting: Family Medicine

## 2018-04-05 DIAGNOSIS — K7030 Alcoholic cirrhosis of liver without ascites: Secondary | ICD-10-CM | POA: Insufficient documentation

## 2018-04-05 LAB — AMMONIA: Ammonia: 82 umol/L — ABNORMAL HIGH (ref 9–35)

## 2018-05-09 ENCOUNTER — Emergency Department
Admission: EM | Admit: 2018-05-09 | Discharge: 2018-05-09 | Disposition: A | Discharge status: Home / Self Care | Payer: Medicare Other | Attending: Emergency Medicine | Admitting: Emergency Medicine

## 2018-05-09 ENCOUNTER — Other Ambulatory Visit: Payer: Self-pay

## 2018-05-09 ENCOUNTER — Emergency Department: Payer: Medicare Other

## 2018-05-09 DIAGNOSIS — Z7982 Long term (current) use of aspirin: Secondary | ICD-10-CM | POA: Insufficient documentation

## 2018-05-09 DIAGNOSIS — F1010 Alcohol abuse, uncomplicated: Secondary | ICD-10-CM | POA: Insufficient documentation

## 2018-05-09 DIAGNOSIS — Y999 Unspecified external cause status: Secondary | ICD-10-CM | POA: Insufficient documentation

## 2018-05-09 DIAGNOSIS — W0110XA Fall on same level from slipping, tripping and stumbling with subsequent striking against unspecified object, initial encounter: Secondary | ICD-10-CM | POA: Insufficient documentation

## 2018-05-09 DIAGNOSIS — M25551 Pain in right hip: Secondary | ICD-10-CM | POA: Diagnosis not present

## 2018-05-09 DIAGNOSIS — I10 Essential (primary) hypertension: Secondary | ICD-10-CM | POA: Diagnosis not present

## 2018-05-09 DIAGNOSIS — Z87891 Personal history of nicotine dependence: Secondary | ICD-10-CM | POA: Insufficient documentation

## 2018-05-09 DIAGNOSIS — Y9301 Activity, walking, marching and hiking: Secondary | ICD-10-CM | POA: Insufficient documentation

## 2018-05-09 DIAGNOSIS — W19XXXA Unspecified fall, initial encounter: Secondary | ICD-10-CM

## 2018-05-09 DIAGNOSIS — R51 Headache: Secondary | ICD-10-CM | POA: Diagnosis not present

## 2018-05-09 DIAGNOSIS — Y929 Unspecified place or not applicable: Secondary | ICD-10-CM | POA: Insufficient documentation

## 2018-05-09 DIAGNOSIS — R110 Nausea: Secondary | ICD-10-CM | POA: Diagnosis present

## 2018-05-09 DIAGNOSIS — Z79899 Other long term (current) drug therapy: Secondary | ICD-10-CM | POA: Diagnosis not present

## 2018-05-09 LAB — COMPREHENSIVE METABOLIC PANEL
ALT: 31 U/L (ref 0–44)
AST: 48 U/L — ABNORMAL HIGH (ref 15–41)
Albumin: 4.3 g/dL (ref 3.5–5.0)
Alkaline Phosphatase: 121 U/L (ref 38–126)
Anion gap: 12 (ref 5–15)
BILIRUBIN TOTAL: 0.5 mg/dL (ref 0.3–1.2)
BUN: 6 mg/dL — ABNORMAL LOW (ref 8–23)
CHLORIDE: 100 mmol/L (ref 98–111)
CO2: 29 mmol/L (ref 22–32)
CREATININE: 0.51 mg/dL — AB (ref 0.61–1.24)
Calcium: 8.5 mg/dL — ABNORMAL LOW (ref 8.9–10.3)
Glucose, Bld: 106 mg/dL — ABNORMAL HIGH (ref 70–99)
Potassium: 3.8 mmol/L (ref 3.5–5.1)
SODIUM: 141 mmol/L (ref 135–145)
TOTAL PROTEIN: 7.5 g/dL (ref 6.5–8.1)

## 2018-05-09 LAB — CBC WITH DIFFERENTIAL/PLATELET
BASOS ABS: 0 10*3/uL (ref 0–0.1)
Basophils Relative: 1 %
EOS ABS: 0 10*3/uL (ref 0–0.7)
EOS PCT: 0 %
HCT: 44.7 % (ref 40.0–52.0)
HEMOGLOBIN: 15.5 g/dL (ref 13.0–18.0)
LYMPHS ABS: 4.3 10*3/uL — AB (ref 1.0–3.6)
Lymphocytes Relative: 48 %
MCH: 32.2 pg (ref 26.0–34.0)
MCHC: 34.7 g/dL (ref 32.0–36.0)
MCV: 92.7 fL (ref 80.0–100.0)
Monocytes Absolute: 0.5 10*3/uL (ref 0.2–1.0)
Monocytes Relative: 5 %
NEUTROS PCT: 46 %
Neutro Abs: 4.1 10*3/uL (ref 1.4–6.5)
Platelets: 123 10*3/uL — ABNORMAL LOW (ref 150–440)
RBC: 4.83 MIL/uL (ref 4.40–5.90)
RDW: 12.6 % (ref 11.5–14.5)
WBC: 9 10*3/uL (ref 3.8–10.6)

## 2018-05-09 LAB — LIPASE, BLOOD: LIPASE: 32 U/L (ref 11–51)

## 2018-05-09 LAB — AMMONIA: AMMONIA: 19 umol/L (ref 9–35)

## 2018-05-09 MED ORDER — ONDANSETRON HCL 4 MG/2ML IJ SOLN
4.0000 mg | Freq: Once | INTRAMUSCULAR | Status: AC
  Administered 2018-05-09: 4 mg via INTRAVENOUS
  Filled 2018-05-09: qty 2

## 2018-05-09 MED ORDER — FAMOTIDINE 20 MG PO TABS: 20.0000 mg | ORAL_TABLET | Freq: Two times a day (BID) | ORAL | 0 refills | Status: DC

## 2018-05-09 MED ORDER — SODIUM CHLORIDE 0.9 % IV BOLUS
1000.0000 mL | Freq: Once | INTRAVENOUS | Status: AC
  Administered 2018-05-09: 1000 mL via INTRAVENOUS

## 2018-05-09 MED ORDER — ONDANSETRON 4 MG PO TBDP: 4.0000 mg | ORAL_TABLET | Freq: Three times a day (TID) | ORAL | 0 refills | Status: DC | PRN

## 2018-05-09 NOTE — ED Notes (Signed)
Dr. Joni Fears at bedside.

## 2018-05-09 NOTE — ED Notes (Signed)
Pt taken to scans via stretcher.

## 2018-05-09 NOTE — ED Triage Notes (Addendum)
Pt arrives [facility] for a fall last night. Pt states he fell on table. Unsure of LOC. States "I drink so much that I don't know." pt c/o R sided HA, also c/o nausea. Denies vomiting. A&O, speaking in complete sentences. No distress noted. No injury to skin on head noted other than bruise. Last drink today, states "quart of wine." denies blood thinner use.   Pt arrives from home, lives alone, uses cane at home. Hx ETOH abuse, HTN.
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
## 2018-05-09 NOTE — ED Notes (Signed)
Pt sister walked up from lobby asking to speak to the doctor prior to her going into room. Dr. Joni Fears informed and standing in hall talking to pt sister.

## 2018-05-16 ENCOUNTER — Emergency Department: Payer: Medicare Other

## 2018-05-16 ENCOUNTER — Encounter: Payer: Self-pay | Admitting: Emergency Medicine

## 2018-05-16 ENCOUNTER — Emergency Department
Admission: EM | Admit: 2018-05-16 | Discharge: 2018-05-16 | Disposition: A | Discharge status: Home / Self Care | Payer: Medicare Other | Attending: Emergency Medicine | Admitting: Emergency Medicine

## 2018-05-16 ENCOUNTER — Other Ambulatory Visit: Payer: Self-pay

## 2018-05-16 DIAGNOSIS — I10 Essential (primary) hypertension: Secondary | ICD-10-CM | POA: Insufficient documentation

## 2018-05-16 DIAGNOSIS — Y999 Unspecified external cause status: Secondary | ICD-10-CM | POA: Diagnosis not present

## 2018-05-16 DIAGNOSIS — W01190A Fall on same level from slipping, tripping and stumbling with subsequent striking against furniture, initial encounter: Secondary | ICD-10-CM | POA: Diagnosis not present

## 2018-05-16 DIAGNOSIS — Z87891 Personal history of nicotine dependence: Secondary | ICD-10-CM | POA: Diagnosis not present

## 2018-05-16 DIAGNOSIS — F10920 Alcohol use, unspecified with intoxication, uncomplicated: Secondary | ICD-10-CM

## 2018-05-16 DIAGNOSIS — Z79899 Other long term (current) drug therapy: Secondary | ICD-10-CM | POA: Diagnosis not present

## 2018-05-16 DIAGNOSIS — S0990XA Unspecified injury of head, initial encounter: Secondary | ICD-10-CM | POA: Diagnosis present

## 2018-05-16 DIAGNOSIS — Z7982 Long term (current) use of aspirin: Secondary | ICD-10-CM | POA: Diagnosis not present

## 2018-05-16 DIAGNOSIS — S0181XA Laceration without foreign body of other part of head, initial encounter: Secondary | ICD-10-CM | POA: Diagnosis not present

## 2018-05-16 DIAGNOSIS — Y92003 Bedroom of unspecified non-institutional (private) residence as the place of occurrence of the external cause: Secondary | ICD-10-CM | POA: Insufficient documentation

## 2018-05-16 DIAGNOSIS — Y9301 Activity, walking, marching and hiking: Secondary | ICD-10-CM | POA: Diagnosis not present

## 2018-05-16 DIAGNOSIS — W19XXXA Unspecified fall, initial encounter: Secondary | ICD-10-CM

## 2018-05-16 LAB — COMPREHENSIVE METABOLIC PANEL
ALBUMIN: 4.4 g/dL (ref 3.5–5.0)
ALK PHOS: 128 U/L — AB (ref 38–126)
ALT: 24 U/L (ref 0–44)
AST: 37 U/L (ref 15–41)
Anion gap: 13 (ref 5–15)
BILIRUBIN TOTAL: 1.1 mg/dL (ref 0.3–1.2)
CO2: 29 mmol/L (ref 22–32)
Calcium: 8.3 mg/dL — ABNORMAL LOW (ref 8.9–10.3)
Chloride: 97 mmol/L — ABNORMAL LOW (ref 98–111)
Creatinine, Ser: 0.47 mg/dL — ABNORMAL LOW (ref 0.61–1.24)
GFR calc Af Amer: >60 mL/min (ref >60)
GFR calc non Af Amer: >60 mL/min (ref >60)
GLUCOSE: 101 mg/dL — AB (ref 70–99)
POTASSIUM: 3.3 mmol/L — AB (ref 3.5–5.1)
SODIUM: 139 mmol/L (ref 135–145)
TOTAL PROTEIN: 7.6 g/dL (ref 6.5–8.1)

## 2018-05-16 LAB — URINE DRUG SCREEN, QUALITATIVE (ARMC ONLY)
AMPHETAMINES, UR SCREEN: NOT DETECTED
Benzodiazepine, Ur Scrn: NOT DETECTED
COCAINE METABOLITE, UR ~~LOC~~: NOT DETECTED
Cannabinoid 50 Ng, Ur ~~LOC~~: NOT DETECTED
MDMA (ECSTASY) UR SCREEN: NOT DETECTED
METHADONE SCREEN, URINE: NOT DETECTED
Opiate, Ur Screen: NOT DETECTED
Phencyclidine (PCP) Ur S: NOT DETECTED
Tricyclic, Ur Screen: NOT DETECTED

## 2018-05-16 LAB — URINALYSIS, COMPLETE (UACMP) WITH MICROSCOPIC
BACTERIA UA: NONE SEEN
Bilirubin Urine: NEGATIVE
GLUCOSE, UA: NEGATIVE mg/dL
Ketones, ur: NEGATIVE mg/dL
LEUKOCYTES UA: NEGATIVE
Nitrite: NEGATIVE
PROTEIN: 30 mg/dL — AB
Specific Gravity, Urine: 1.004 — ABNORMAL LOW (ref 1.005–1.030)
pH: 6 (ref 5.0–8.0)

## 2018-05-16 LAB — CBC
HCT: 44.1 % (ref 40.0–52.0)
HEMOGLOBIN: 15.5 g/dL (ref 13.0–18.0)
MCH: 32.5 pg (ref 26.0–34.0)
MCHC: 35.1 g/dL (ref 32.0–36.0)
MCV: 92.5 fL (ref 80.0–100.0)
Platelets: 71 10*3/uL — ABNORMAL LOW (ref 150–440)
RBC: 4.77 MIL/uL (ref 4.40–5.90)
RDW: 12.7 % (ref 11.5–14.5)
WBC: 9.7 10*3/uL (ref 3.8–10.6)

## 2018-05-16 LAB — ETHANOL: ALCOHOL ETHYL (B): 275 mg/dL — AB (ref <10)

## 2018-05-16 MED ORDER — ATENOLOL 25 MG PO TABS: 25.0000 mg | ORAL_TABLET | Freq: Every day | ORAL | Status: DC

## 2018-05-16 MED ORDER — LORAZEPAM 2 MG PO TABS: 0.0000 mg | ORAL_TABLET | Freq: Two times a day (BID) | ORAL | Status: DC

## 2018-05-16 MED ORDER — VITAMIN B-1 100 MG PO TABS: 100.0000 mg | ORAL_TABLET | Freq: Every day | ORAL | Status: DC

## 2018-05-16 MED ORDER — LORAZEPAM 2 MG/ML IJ SOLN: 0.0000 mg | Freq: Four times a day (QID) | INTRAMUSCULAR | Status: DC

## 2018-05-16 MED ORDER — ACETAMINOPHEN 325 MG PO TABS
ORAL_TABLET | ORAL | Status: AC
  Filled 2018-05-16: qty 2

## 2018-05-16 MED ORDER — ONDANSETRON 4 MG PO TBDP
ORAL_TABLET | ORAL | Status: AC
  Administered 2018-05-16: 4 mg
  Filled 2018-05-16: qty 4

## 2018-05-16 MED ORDER — LORAZEPAM 2 MG/ML IJ SOLN: 0.0000 mg | Freq: Two times a day (BID) | INTRAMUSCULAR | Status: DC

## 2018-05-16 MED ORDER — DILTIAZEM HCL ER COATED BEADS 120 MG PO CP24
120.0000 mg | ORAL_CAPSULE | Freq: Two times a day (BID) | ORAL | Status: DC
  Filled 2018-05-16 (×2): qty 1

## 2018-05-16 MED ORDER — SODIUM CHLORIDE 0.9 % IV BOLUS
1000.0000 mL | Freq: Once | INTRAVENOUS | Status: AC
  Administered 2018-05-16: 1000 mL via INTRAVENOUS

## 2018-05-16 MED ORDER — FAMOTIDINE 20 MG PO TABS
20.0000 mg | ORAL_TABLET | Freq: Two times a day (BID) | ORAL | Status: DC
  Administered 2018-05-16: 20 mg via ORAL

## 2018-05-16 MED ORDER — ONDANSETRON HCL 4 MG/2ML IJ SOLN
4.0000 mg | Freq: Once | INTRAMUSCULAR | Status: AC
  Administered 2018-05-16: 4 mg via INTRAVENOUS

## 2018-05-16 MED ORDER — ONDANSETRON HCL 4 MG PO TABS
4.0000 mg | ORAL_TABLET | Freq: Once | ORAL | Status: AC
  Administered 2018-05-16: 4 mg via ORAL

## 2018-05-16 MED ORDER — ONDANSETRON HCL 4 MG/2ML IJ SOLN
INTRAMUSCULAR | Status: AC
  Administered 2018-05-16: 4 mg via INTRAVENOUS
  Filled 2018-05-16: qty 2

## 2018-05-16 MED ORDER — LIDOCAINE-EPINEPHRINE-TETRACAINE (LET) SOLUTION
3.0000 mL | Freq: Once | NASAL | Status: AC
  Administered 2018-05-16: 3 mL via TOPICAL
  Filled 2018-05-16: qty 3

## 2018-05-16 MED ORDER — LORAZEPAM 2 MG PO TABS
0.0000 mg | ORAL_TABLET | Freq: Four times a day (QID) | ORAL | Status: DC
  Administered 2018-05-16: 2 mg via ORAL
  Filled 2018-05-16: qty 1

## 2018-05-16 MED ORDER — THIAMINE HCL 100 MG/ML IJ SOLN
100.0000 mg | Freq: Every day | INTRAMUSCULAR | Status: DC
  Filled 2018-05-16: qty 2

## 2018-05-16 MED ORDER — ACETAMINOPHEN 325 MG PO TABS
650.0000 mg | ORAL_TABLET | Freq: Once | ORAL | Status: AC
  Administered 2018-05-16: 650 mg via ORAL

## 2018-05-16 MED ORDER — LIDOCAINE HCL (PF) 1 % IJ SOLN
5.0000 mL | Freq: Once | INTRAMUSCULAR | Status: DC
  Filled 2018-05-16: qty 5

## 2018-05-16 MED ORDER — BACITRACIN ZINC 500 UNIT/GM EX OINT
TOPICAL_OINTMENT | Freq: Once | CUTANEOUS | Status: AC
  Administered 2018-05-16: 12:00:00 via TOPICAL
  Filled 2018-05-16: qty 0.9

## 2018-05-16 NOTE — ED Notes (Signed)
Pt. States "I was drinking wine tonight, I was walking from living room to bedroom and I tripped and fell on night stand.  Pt. Unsure if he lost consciousness.   Pt. States he has had a stroke in the past and does not have full use of rt. Arm. Pt. Has about a 3 cm laceration above lt. Eye.

## 2018-05-16 NOTE — Discharge Instructions (Signed)
Please follow your primary care physician for further evaluation. Please return with any worsened condition.

## 2018-05-16 NOTE — Progress Notes (Signed)
Type of Service: Clinical Social Work   Douglas Peterson is a 67 y.o. male referred by RN  for concerns with discharging home due to not being able to walk. Patient was accompanied by his sister at bedside. [name] pleasant and engaged in conversation. Reports concerns of  being weak and not able to walk. Duration of problem:yesterday after a fall and drinking alcohol.  Mental Health/ Substance Hx: history of alcohol use on and off for many years.   Most recently started drinking again about two months ago. Life & Social patient lives alone ,support system with sister, enjoys going to the gym before started drinking 2 months ago.  Recent life changes: started drinking again. Issues discussed: support system, previous and current coping skills, community resources , things patient enjoy or use to enjoy doing, concerns with drinking and what is needed for safe discharge home, Home health, and PT options.   Intervention: Reflective listening, supportive counseling, solutions focus strategies, community resources, and emotional support. Assessment: [name] having difficulty walking, able to stand with assistance of Nurse Tech. However unable to make steps. At baseline patient uses a walker, or cane.  Admits to continued drinking and knows this is contributing to his situation.  Sister unable to get him into his home.   Patient may benefit from home health, PT as well as EMT for transport, and is in agreement. Patient also in agreement to seek assistant with alcohol use. RN case manager met with patient to set up Home Health services.   Plan: 1. Patient will discharge from ED via EMS.  2. Patient will F/U with PCP 3. Patient will contact [facility] for substance treatment   , LCSW Licensed Clinical Social Worker 11:35 AM   

## 2018-05-16 NOTE — ED Triage Notes (Signed)
Pt arrives [facility] with c/o fall. Pt reports ETOH at this time and has hx of falls. Pt has laceration noted over left eye. Pt is a/o at this time and talking in complete and coherent sentences. Pt appears in NAD.

## 2018-05-16 NOTE — ED Notes (Signed)
Patients sister is now with patient.

## 2018-05-16 NOTE — ED Notes (Signed)
Patients face and head cleaned of blood, bleeding controlled with bandage on laceration.

## 2018-05-16 NOTE — [facility] Counselor (Addendum)
Pt is being followed-up with clinical social work for adequate community resources to best meet his needs.

## 2018-05-16 NOTE — ED Notes (Signed)
Pt. Taken to xray

## 2018-05-16 NOTE — ED Provider Notes (Signed)
-----------------------------------------   11:04 AM on 05/16/2018 -----------------------------------------  Sent for discharge but was somewhat unsteady on his feet, he was able to ambulate but not a great distance, we therefore had social work evaluate him.  Patient refuses placement is adamant that he wants to go home I think he has the capacity to make this decision.  He will be with his sister, who is comfort with our plan.  However, we are sending the patient up with home health, PT and social work for further assessment.  Social work also states the patient is not a candidate for SNF placement at this time in addition to his refusal.  Otherwise, patient was stable for discharge some hours ago we will discharge him at this time he has no further complaints.  He is made aware of all of his findings and the need to closely follow-up.   Schuyler Amor, MD 05/16/18 1105

## 2018-05-16 NOTE — ED Notes (Signed)
BEHAVIORAL HEALTH ROUNDING Patient sleeping: No. Patient alert and oriented: yes Behavior appropriate: Yes.  ; If no, describe:  Nutrition and fluids offered: yes Toileting and hygiene offered: Yes  Sitter present: q15 minute observations and security monitoring Law enforcement present: Yes

## 2018-05-16 NOTE — ED Notes (Signed)
Patient discharged home, patient received discharge papers. Patient transported by EMS. Patient received belongings and verbalized he has received all of his belongings. Patient appropriate and cooperative, Denies SI/HI AVH. Vital signs taken. NAD noted.

## 2018-05-16 NOTE — ED Notes (Signed)
Called [facility]  for transport to [facility], [place]
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
## 2018-05-16 NOTE — Care Management Note (Signed)
Case Management Note  Patient Details  Name: Jadarian Mckay MRN: [id number] Date of Birth: 06-25-1951  Subjective/Objective:  Patient to be discharged per MD order. RNCM consulted for [facility] needs. Patient in room and sister Mendy [phone] at bedside. Patient to receive home health services. Given choice, [facility]. Referral placed with [facility] who agrees to accept the patient for PT, RN and aide. Patient unable to ambulate at this time. Will utilize EMS for transport services. Medical necessity completed.  Merrily Pew  RN BSN RNCM [phone]                  Action/Plan:   Expected Discharge Date:                  Expected Discharge Plan:     In-House Referral:     Discharge planning Services  CM Consult  Post Acute Care Choice:  Home Health Choice offered to:  Patient  DME Arranged:    DME Agency:     HH Arranged:  RN, PT, Nurse's Aide [place] Agency:  [place]  Status of Service:  Completed, signed off  If discussed at [place] of Stay Meetings, dates discussed:    Additional Comments:  Latanya Maudlin, RN 05/16/2018, 11:26 AM

## 2018-05-16 NOTE — ED Notes (Signed)
Called [facility] for transport to [facility], [place]

## 2018-05-17 NOTE — Care Management (Signed)
Post discharge note entry: RNCM received notification that patient is requesting name of agency and when they would be coming out.  RNCM reached out to [place] with [facility] to have agency reach out to patient to update. RNCM also reached out to patient and spoke with patient's sister. [facility] was currently in the patient's home doing their [facility]. She states that she/patient has misplaced discharge paperwork also.  RNCM explained that [facility] should be able to pull discharge instructions from EMR and share with patient.

## 2018-06-28 ENCOUNTER — Emergency Department
Admission: EM | Admit: 2018-06-28 | Discharge: 2018-06-29 | Disposition: A | Discharge status: Home / Self Care | Payer: Medicare Other | Attending: Emergency Medicine | Admitting: Emergency Medicine

## 2018-06-28 ENCOUNTER — Encounter: Payer: Self-pay | Admitting: Emergency Medicine

## 2018-06-28 ENCOUNTER — Other Ambulatory Visit: Payer: Self-pay

## 2018-06-28 DIAGNOSIS — I10 Essential (primary) hypertension: Secondary | ICD-10-CM | POA: Insufficient documentation

## 2018-06-28 DIAGNOSIS — F10920 Alcohol use, unspecified with intoxication, uncomplicated: Secondary | ICD-10-CM | POA: Insufficient documentation

## 2018-06-28 DIAGNOSIS — R251 Tremor, unspecified: Secondary | ICD-10-CM | POA: Insufficient documentation

## 2018-06-28 DIAGNOSIS — Z87891 Personal history of nicotine dependence: Secondary | ICD-10-CM | POA: Diagnosis not present

## 2018-06-28 DIAGNOSIS — Z7982 Long term (current) use of aspirin: Secondary | ICD-10-CM | POA: Insufficient documentation

## 2018-06-28 DIAGNOSIS — Z79899 Other long term (current) drug therapy: Secondary | ICD-10-CM | POA: Diagnosis not present

## 2018-06-28 LAB — URINE DRUG SCREEN, QUALITATIVE (ARMC ONLY)
Amphetamines, Ur Screen: NOT DETECTED
Barbiturates, Ur Screen: NOT DETECTED
CANNABINOID 50 NG, UR ~~LOC~~: NOT DETECTED
Cocaine Metabolite,Ur ~~LOC~~: NOT DETECTED
MDMA (ECSTASY) UR SCREEN: NOT DETECTED
Methadone Scn, Ur: NOT DETECTED
Opiate, Ur Screen: NOT DETECTED
PHENCYCLIDINE (PCP) UR S: NOT DETECTED
Tricyclic, Ur Screen: NOT DETECTED

## 2018-06-28 MED ORDER — ONDANSETRON 4 MG PO TBDP
4.0000 mg | ORAL_TABLET | Freq: Once | ORAL | Status: AC
  Administered 2018-06-29: 4 mg via ORAL
  Filled 2018-06-28: qty 1

## 2018-06-28 MED ORDER — LORAZEPAM 1 MG PO TABS
1.0000 mg | ORAL_TABLET | Freq: Once | ORAL | Status: AC
  Administered 2018-06-29: 1 mg via ORAL
  Filled 2018-06-28: qty 1

## 2018-06-28 NOTE — ED Triage Notes (Signed)
Pt arrived to the ED via EMS from home for a fall secondary to alcohol intoxication. Pt reports that he has a problem with balance secondary to a stroke and that combined with being intoxicated made him fall today. Pt denies LOC but reports that it took him 3 hrs to get off of the floor. Pt is AOx4 in no apparent distress.

## 2018-06-28 NOTE — ED Notes (Signed)
Assisted patient to the bathroom. Patient changed out of wet clothing into blue scrub bottoms. Basketball shorts and orange boxers placed in patient belongings bag and given to patient.

## 2018-06-29 ENCOUNTER — Emergency Department: Payer: Medicare Other

## 2018-06-29 LAB — COMPREHENSIVE METABOLIC PANEL
ALBUMIN: 4.9 g/dL (ref 3.5–5.0)
ALK PHOS: 129 U/L — AB (ref 38–126)
ALT: 102 U/L — ABNORMAL HIGH (ref 0–44)
AST: 159 U/L — ABNORMAL HIGH (ref 15–41)
Anion gap: 13 (ref 5–15)
BUN: <5 mg/dL — ABNORMAL LOW (ref 8–23)
CHLORIDE: 95 mmol/L — AB (ref 98–111)
CO2: 30 mmol/L (ref 22–32)
CREATININE: 0.35 mg/dL — AB (ref 0.61–1.24)
Calcium: 9.1 mg/dL (ref 8.9–10.3)
GFR calc non Af Amer: >60 mL/min (ref >60)
GLUCOSE: 113 mg/dL — AB (ref 70–99)
Potassium: 3.4 mmol/L — ABNORMAL LOW (ref 3.5–5.1)
SODIUM: 138 mmol/L (ref 135–145)
Total Bilirubin: 1.4 mg/dL — ABNORMAL HIGH (ref 0.3–1.2)
Total Protein: 8.5 g/dL — ABNORMAL HIGH (ref 6.5–8.1)

## 2018-06-29 LAB — CBC
HCT: 43.4 % (ref 40.0–52.0)
Hemoglobin: 15.3 g/dL (ref 13.0–18.0)
MCH: 32 pg (ref 26.0–34.0)
MCHC: 35.2 g/dL (ref 32.0–36.0)
MCV: 90.9 fL (ref 80.0–100.0)
PLATELETS: 102 10*3/uL — AB (ref 150–440)
RBC: 4.78 MIL/uL (ref 4.40–5.90)
RDW: 12.8 % (ref 11.5–14.5)
WBC: 8 10*3/uL (ref 3.8–10.6)

## 2018-06-29 LAB — ETHANOL: ALCOHOL ETHYL (B): 183 mg/dL — AB (ref <10)

## 2018-06-29 MED ORDER — LORAZEPAM 1 MG PO TABS
ORAL_TABLET | ORAL | Status: AC
  Filled 2018-06-29: qty 1

## 2018-06-29 MED ORDER — ONDANSETRON 4 MG PO TBDP
4.0000 mg | ORAL_TABLET | Freq: Once | ORAL | Status: AC
  Administered 2018-06-29: 4 mg via ORAL

## 2018-06-29 MED ORDER — ONDANSETRON 4 MG PO TBDP
ORAL_TABLET | ORAL | Status: AC
  Administered 2018-06-29: 4 mg via ORAL
  Filled 2018-06-29: qty 1

## 2018-06-29 MED ORDER — CHLORDIAZEPOXIDE HCL 25 MG PO CAPS: ORAL_CAPSULE | ORAL | 0 refills | Status: DC

## 2018-06-29 MED ORDER — LORAZEPAM 1 MG PO TABS
1.0000 mg | ORAL_TABLET | Freq: Once | ORAL | Status: AC
  Administered 2018-06-29: 1 mg via ORAL
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
## 2018-06-29 NOTE — Discharge Instructions (Signed)
Please follow up with your primary care physician for further evaluation of your symptoms.

## 2018-06-29 NOTE — ED Notes (Signed)
Pt ambulated well with minimal assistance. His sister can come and pick him up she just wanted to ensure that he could walk.

## 2018-10-11 ENCOUNTER — Other Ambulatory Visit: Payer: Self-pay

## 2018-10-11 ENCOUNTER — Emergency Department: Payer: Medicare Other

## 2018-10-11 ENCOUNTER — Observation Stay
Admission: EM | Admit: 2018-10-11 | Discharge: 2018-10-13 | Disposition: A | Discharge status: Home / Self Care | Payer: Medicare Other | Attending: Internal Medicine | Admitting: Internal Medicine

## 2018-10-11 ENCOUNTER — Encounter: Payer: Self-pay | Admitting: Emergency Medicine

## 2018-10-11 DIAGNOSIS — F1010 Alcohol abuse, uncomplicated: Secondary | ICD-10-CM | POA: Diagnosis not present

## 2018-10-11 DIAGNOSIS — G9341 Metabolic encephalopathy: Secondary | ICD-10-CM | POA: Diagnosis present

## 2018-10-11 DIAGNOSIS — E860 Dehydration: Secondary | ICD-10-CM | POA: Insufficient documentation

## 2018-10-11 DIAGNOSIS — D696 Thrombocytopenia, unspecified: Secondary | ICD-10-CM | POA: Insufficient documentation

## 2018-10-11 DIAGNOSIS — R740 Nonspecific elevation of levels of transaminase and lactic acid dehydrogenase [LDH]: Secondary | ICD-10-CM | POA: Insufficient documentation

## 2018-10-11 DIAGNOSIS — Z87891 Personal history of nicotine dependence: Secondary | ICD-10-CM | POA: Diagnosis not present

## 2018-10-11 DIAGNOSIS — M8580 Other specified disorders of bone density and structure, unspecified site: Secondary | ICD-10-CM | POA: Insufficient documentation

## 2018-10-11 DIAGNOSIS — R197 Diarrhea, unspecified: Secondary | ICD-10-CM | POA: Diagnosis not present

## 2018-10-11 DIAGNOSIS — J341 Cyst and mucocele of nose and nasal sinus: Secondary | ICD-10-CM | POA: Insufficient documentation

## 2018-10-11 DIAGNOSIS — Z7982 Long term (current) use of aspirin: Secondary | ICD-10-CM | POA: Diagnosis not present

## 2018-10-11 DIAGNOSIS — Z87820 Personal history of traumatic brain injury: Secondary | ICD-10-CM | POA: Insufficient documentation

## 2018-10-11 DIAGNOSIS — I10 Essential (primary) hypertension: Secondary | ICD-10-CM | POA: Diagnosis not present

## 2018-10-11 DIAGNOSIS — M6281 Muscle weakness (generalized): Secondary | ICD-10-CM | POA: Diagnosis not present

## 2018-10-11 DIAGNOSIS — R4182 Altered mental status, unspecified: Secondary | ICD-10-CM | POA: Diagnosis present

## 2018-10-11 DIAGNOSIS — G92 Toxic encephalopathy: Principal | ICD-10-CM | POA: Insufficient documentation

## 2018-10-11 LAB — CBC
HCT: 43.4 % (ref 39.0–52.0)
HEMOGLOBIN: 14.5 g/dL (ref 13.0–17.0)
MCH: 33.3 pg (ref 26.0–34.0)
MCHC: 33.4 g/dL (ref 30.0–36.0)
MCV: 99.8 fL (ref 80.0–100.0)
NRBC: 0 % (ref 0.0–0.2)
Platelets: 145 10*3/uL — ABNORMAL LOW (ref 150–400)
RBC: 4.35 MIL/uL (ref 4.22–5.81)
RDW: 12.1 % (ref 11.5–15.5)
WBC: 6.5 10*3/uL (ref 4.0–10.5)

## 2018-10-11 LAB — URINALYSIS, COMPLETE (UACMP) WITH MICROSCOPIC
Bacteria, UA: NONE SEEN
Bilirubin Urine: NEGATIVE
GLUCOSE, UA: NEGATIVE mg/dL
HGB URINE DIPSTICK: NEGATIVE
Ketones, ur: 5 mg/dL — AB
LEUKOCYTES UA: NEGATIVE
NITRITE: NEGATIVE
PH: 5 (ref 5.0–8.0)
Protein, ur: NEGATIVE mg/dL
SPECIFIC GRAVITY, URINE: 1.027 (ref 1.005–1.030)
Squamous Epithelial / LPF: NONE SEEN (ref 0–5)

## 2018-10-11 LAB — COMPREHENSIVE METABOLIC PANEL
ALK PHOS: 96 U/L (ref 38–126)
ALT: 49 U/L — AB (ref 0–44)
AST: 50 U/L — ABNORMAL HIGH (ref 15–41)
Albumin: 4.3 g/dL (ref 3.5–5.0)
Anion gap: 6 (ref 5–15)
BILIRUBIN TOTAL: 0.6 mg/dL (ref 0.3–1.2)
BUN: 14 mg/dL (ref 8–23)
CALCIUM: 9.7 mg/dL (ref 8.9–10.3)
CO2: 29 mmol/L (ref 22–32)
CREATININE: 0.49 mg/dL — AB (ref 0.61–1.24)
Chloride: 106 mmol/L (ref 98–111)
Glucose, Bld: 108 mg/dL — ABNORMAL HIGH (ref 70–99)
Potassium: 3.7 mmol/L (ref 3.5–5.1)
Sodium: 141 mmol/L (ref 135–145)
TOTAL PROTEIN: 7.6 g/dL (ref 6.5–8.1)

## 2018-10-11 LAB — TROPONIN I: Troponin I: <0.03 ng/mL (ref <0.03)

## 2018-10-11 NOTE — ED Triage Notes (Signed)
States he has been feeling weak x 3 days. States 3 days ago decided to stop drinking and began taking librium. No tremor noted. Patient has history of TBI with R arm contraction and leg weakness. Sister with patient and states speech is usual sound but slightly slower than usual and movements are slower than usual. States appetite is good. [name] alert and can state this history also.
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
## 2018-10-12 ENCOUNTER — Emergency Department: Payer: Medicare Other

## 2018-10-12 ENCOUNTER — Encounter: Payer: Self-pay | Admitting: Internal Medicine

## 2018-10-12 DIAGNOSIS — R4182 Altered mental status, unspecified: Secondary | ICD-10-CM | POA: Diagnosis present

## 2018-10-12 DIAGNOSIS — G9341 Metabolic encephalopathy: Secondary | ICD-10-CM | POA: Diagnosis present

## 2018-10-12 LAB — LACTATE DEHYDROGENASE: LDH: 135 U/L (ref 98–192)

## 2018-10-12 LAB — URINE DRUG SCREEN, QUALITATIVE (ARMC ONLY)
AMPHETAMINES, UR SCREEN: NOT DETECTED
Barbiturates, Ur Screen: NOT DETECTED
Benzodiazepine, Ur Scrn: POSITIVE — AB
COCAINE METABOLITE, UR ~~LOC~~: NOT DETECTED
Cannabinoid 50 Ng, Ur ~~LOC~~: NOT DETECTED
MDMA (Ecstasy)Ur Screen: NOT DETECTED
METHADONE SCREEN, URINE: NOT DETECTED
OPIATE, UR SCREEN: NOT DETECTED
Phencyclidine (PCP) Ur S: NOT DETECTED
Tricyclic, Ur Screen: NOT DETECTED

## 2018-10-12 LAB — VITAMIN B12: Vitamin B-12: 1334 pg/mL — ABNORMAL HIGH (ref 180–914)

## 2018-10-12 LAB — MAGNESIUM: Magnesium: 1.9 mg/dL (ref 1.7–2.4)

## 2018-10-12 LAB — PROTIME-INR
INR: 1.16
PROTHROMBIN TIME: 14.7 s (ref 11.4–15.2)

## 2018-10-12 LAB — FOLATE: Folate: 32 ng/mL

## 2018-10-12 LAB — AMMONIA: Ammonia: 32 umol/L (ref 9–35)

## 2018-10-12 LAB — PHOSPHORUS: Phosphorus: 3.8 mg/dL (ref 2.5–4.6)

## 2018-10-12 LAB — ETHANOL: Alcohol, Ethyl (B): <10 mg/dL (ref <10)

## 2018-10-12 LAB — GAMMA GT: GGT: 309 U/L — ABNORMAL HIGH (ref 7–50)

## 2018-10-12 LAB — TSH: TSH: 1.411 u[IU]/mL (ref 0.350–4.500)

## 2018-10-12 LAB — APTT: aPTT: 37 seconds — ABNORMAL HIGH (ref 24–36)

## 2018-10-12 MED ORDER — DILTIAZEM HCL ER COATED BEADS 120 MG PO CP24
120.0000 mg | ORAL_CAPSULE | Freq: Two times a day (BID) | ORAL | Status: DC
  Administered 2018-10-12 – 2018-10-13 (×2): 120 mg via ORAL
  Filled 2018-10-12 (×3): qty 1

## 2018-10-12 MED ORDER — LORAZEPAM 2 MG PO TABS: 0.0000 mg | ORAL_TABLET | Freq: Four times a day (QID) | ORAL | Status: DC

## 2018-10-12 MED ORDER — LACTULOSE 10 GM/15ML PO SOLN
20.0000 g | Freq: Every day | ORAL | Status: DC
  Administered 2018-10-12 – 2018-10-13 (×3): 20 g via ORAL
  Filled 2018-10-12 (×2): qty 30

## 2018-10-12 MED ORDER — ADULT MULTIVITAMIN W/MINERALS CH
1.0000 | ORAL_TABLET | Freq: Every day | ORAL | Status: DC
  Administered 2018-10-12 – 2018-10-13 (×2): 1 via ORAL
  Filled 2018-10-12 (×2): qty 1

## 2018-10-12 MED ORDER — THIAMINE HCL 100 MG/ML IJ SOLN
100.0000 mg | Freq: Every day | INTRAMUSCULAR | Status: DC
  Filled 2018-10-12: qty 1

## 2018-10-12 MED ORDER — SENNOSIDES-DOCUSATE SODIUM 8.6-50 MG PO TABS: 1.0000 | ORAL_TABLET | Freq: Every evening | ORAL | Status: DC | PRN

## 2018-10-12 MED ORDER — LORAZEPAM 2 MG/ML IJ SOLN: 0.0000 mg | Freq: Two times a day (BID) | INTRAMUSCULAR | Status: DC

## 2018-10-12 MED ORDER — LORAZEPAM 2 MG/ML IJ SOLN: 1.0000 mg | Freq: Four times a day (QID) | INTRAMUSCULAR | Status: DC | PRN

## 2018-10-12 MED ORDER — LORAZEPAM 2 MG/ML IJ SOLN
0.0000 mg | Freq: Four times a day (QID) | INTRAMUSCULAR | Status: DC
  Administered 2018-10-12: 2 mg via INTRAVENOUS
  Filled 2018-10-12: qty 1

## 2018-10-12 MED ORDER — LORAZEPAM 1 MG PO TABS: 1.0000 mg | ORAL_TABLET | Freq: Four times a day (QID) | ORAL | Status: DC | PRN

## 2018-10-12 MED ORDER — ENOXAPARIN SODIUM 40 MG/0.4ML ~~LOC~~ SOLN: 40.0000 mg | SUBCUTANEOUS | Status: DC

## 2018-10-12 MED ORDER — ATENOLOL 25 MG PO TABS
25.0000 mg | ORAL_TABLET | Freq: Every day | ORAL | Status: DC
  Administered 2018-10-12: 21:00:00 25 mg via ORAL
  Filled 2018-10-12 (×2): qty 1

## 2018-10-12 MED ORDER — PAROXETINE HCL 20 MG PO TABS
20.0000 mg | ORAL_TABLET | Freq: Every day | ORAL | Status: DC
  Administered 2018-10-12 – 2018-10-13 (×2): 20 mg via ORAL
  Filled 2018-10-12 (×2): qty 1

## 2018-10-12 MED ORDER — ONDANSETRON HCL 4 MG/2ML IJ SOLN: 4.0000 mg | Freq: Four times a day (QID) | INTRAMUSCULAR | Status: DC | PRN

## 2018-10-12 MED ORDER — FOLIC ACID 1 MG PO TABS
1.0000 mg | ORAL_TABLET | Freq: Every day | ORAL | Status: DC
  Administered 2018-10-12 – 2018-10-13 (×2): 1 mg via ORAL
  Filled 2018-10-12 (×2): qty 1

## 2018-10-12 MED ORDER — BISACODYL 5 MG PO TBEC: 5.0000 mg | DELAYED_RELEASE_TABLET | Freq: Every day | ORAL | Status: DC | PRN

## 2018-10-12 MED ORDER — LORAZEPAM 2 MG/ML IJ SOLN: 0.0000 mg | Freq: Four times a day (QID) | INTRAMUSCULAR | Status: DC

## 2018-10-12 MED ORDER — ASPIRIN EC 81 MG PO TBEC
81.0000 mg | DELAYED_RELEASE_TABLET | Freq: Every day | ORAL | Status: DC
  Administered 2018-10-12 – 2018-10-13 (×2): 81 mg via ORAL
  Filled 2018-10-12 (×2): qty 1

## 2018-10-12 MED ORDER — LORAZEPAM 2 MG PO TABS: 0.0000 mg | ORAL_TABLET | Freq: Two times a day (BID) | ORAL | Status: DC

## 2018-10-12 MED ORDER — VITAMIN B-1 100 MG PO TABS
100.0000 mg | ORAL_TABLET | Freq: Every day | ORAL | Status: DC
  Administered 2018-10-12: 100 mg via ORAL
  Filled 2018-10-12: qty 1

## 2018-10-12 MED ORDER — LACTATED RINGERS IV SOLN
INTRAVENOUS | Status: AC
  Administered 2018-10-12: 08:00:00 via INTRAVENOUS

## 2018-10-12 MED ORDER — ENOXAPARIN SODIUM 40 MG/0.4ML ~~LOC~~ SOLN
40.0000 mg | Freq: Two times a day (BID) | SUBCUTANEOUS | Status: DC
  Administered 2018-10-12 – 2018-10-13 (×2): 40 mg via SUBCUTANEOUS
  Filled 2018-10-12 (×2): qty 0.4

## 2018-10-12 MED ORDER — LACTATED RINGERS IV BOLUS
1000.0000 mL | Freq: Once | INTRAVENOUS | Status: AC
  Administered 2018-10-12: 1000 mL via INTRAVENOUS

## 2018-10-12 MED ORDER — VITAMIN B-1 100 MG PO TABS
100.0000 mg | ORAL_TABLET | Freq: Every day | ORAL | Status: DC
  Administered 2018-10-12 – 2018-10-13 (×2): 100 mg via ORAL
  Filled 2018-10-12 (×2): qty 1

## 2018-10-12 MED ORDER — ONDANSETRON HCL 4 MG PO TABS: 4.0000 mg | ORAL_TABLET | Freq: Four times a day (QID) | ORAL | Status: DC | PRN

## 2018-10-12 MED ORDER — THIAMINE HCL 100 MG/ML IJ SOLN
100.0000 mg | Freq: Every day | INTRAMUSCULAR | Status: DC
  Filled 2018-10-12: qty 2

## 2018-10-12 MED ORDER — FAMOTIDINE 20 MG PO TABS
20.0000 mg | ORAL_TABLET | Freq: Two times a day (BID) | ORAL | Status: DC
  Administered 2018-10-12 – 2018-10-13 (×3): 20 mg via ORAL
  Filled 2018-10-12 (×3): qty 1

## 2018-10-12 MED ORDER — SODIUM CHLORIDE 0.9 % IV BOLUS
1000.0000 mL | Freq: Once | INTRAVENOUS | Status: AC
  Administered 2018-10-12: 1000 mL via INTRAVENOUS

## 2018-10-12 NOTE — ED Notes (Addendum)
2 unsuccessful attempts to draw blood for labs. Seth Bake, Charge RN, and [place], lab tech made aware at this time.

## 2018-10-12 NOTE — Care Management Obs Status (Signed)
[facility] NOTIFICATION   Patient Details  Name: Douglas Peterson. MRN: [id number] Date of Birth: 1951-10-30   Medicare Observation Status Notification Given:  Yes    Katrina Stack, RN 10/12/2018, 3:05 PM
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
## 2018-10-12 NOTE — Progress Notes (Signed)
Anticoagulation monitoring(Lovenox):  67yo  male ordered Lovenox 40 mg Q24h  Filed Weights   10/11/18 1609  Weight: 248 lb 7.3 oz (112.7 kg)   BMI 42.6   Lab Results  Component Value Date   CREATININE 0.49 (L) 10/11/2018   CREATININE 0.35 (L) 06/29/2018   CREATININE 0.47 (L) 05/16/2018   Estimated Creatinine Clearance: 102.1 mL/min (A) (by C-G formula based on SCr of 0.49 mg/dL (L)). Hemoglobin & Hematocrit     Component Value Date/Time   HGB 14.5 10/11/2018 1612   HCT 43.4 10/11/2018 1612     Per Protocol for Patient with estCrcl > 30 ml/min and BMI > 40, will transition to Lovenox 40 mg Q12h.

## 2018-10-12 NOTE — ED Notes (Addendum)
Pt urinated in bed.  Linens changed.  Pt cleaned. Pt turned self and followed commands appropriately during linen change and cleaning.  Call bell within reach.  Instructed pt to use call bell if he needs to urinate or has other requests.  Pt verbalized understanding.

## 2018-10-12 NOTE — Progress Notes (Signed)
Patient admitted overnight and seen by me this morning.  Per sister at bedside, he still appears a little bit confused.  Patient states he feels "foggy".  This started after taking Librium.  Last drink was Friday at 3 AM.  On exam, he is alert and oriented x3.  Right arm with some spasticity and held close to the body (chronic issue).  Left leg with 3/5 muscle strength (chronic).  -Agree with admission H&P -Encephalopathy likely drug-related due to Librium use -Will monitor mental status closely -If no improvement in mental status, may need to consider MRI -CIWA -PT/OT consult  Hyman Bible, MD [facility]

## 2018-10-12 NOTE — Care Management Note (Signed)
Case Management Note  Patient Details  Name: Douglas Peterson. MRN: [id number] Date of Birth: 07-Jul-1951  Subjective/Objective:                 Placed in observation from home for altered mental status.  History of etoh and last drink 12/6.  On CIWA. PCP is  DR Raechel Ache but has not seen him in a while.  Spent much time explaining to patient and his sister the contents of the observation notice.  CM emphasized that at present, he would not be able to go to a skilled nursing facility under medicare if it was recommended by physical therapy.  Not sure if patient is having issues with comprehension.  He asks questions that CM just answered.  He denies issues hearing.  MRI will be ordered if patient mental status does no improve  Action/Plan:  Obtained order for physical therapy.    Expected Discharge Date:                  Expected Discharge Plan:     In-House Referral:     Discharge planning Services     Post Acute Care Choice:    Choice offered to:     DME Arranged:    DME Agency:     HH Arranged:    HH Agency:     Status of Service:     If discussed at [employer], dates discussed:    Additional Comments:  Katrina Stack, RN 10/12/2018, 4:58 PM

## 2018-10-12 NOTE — ED Provider Notes (Signed)
Douglas Peterson is a 67 year old man with a history of alcoholism with recent Librium use.  Last use approximately 24 hours ago.  However, the patient remains sedated.  Negative alcohol level.  Reassuring work-up otherwise.  Possibly effect of the Librium.  However, there was not evidence that the patient had overdosed on the Librium.  Patient does not appear to have a neurologic exam with a focal deficit.  To be admitted to the hospital for encephalopathy.  Signed out to Dr. Oren Bracket.    Douglas Pyo, MD 10/12/18 0330

## 2018-10-13 LAB — CBC
HCT: 39.8 % (ref 39.0–52.0)
Hemoglobin: 13.3 g/dL (ref 13.0–17.0)
MCH: 33.6 pg (ref 26.0–34.0)
MCHC: 33.4 g/dL (ref 30.0–36.0)
MCV: 100.5 fL — ABNORMAL HIGH (ref 80.0–100.0)
Platelets: 133 10*3/uL — ABNORMAL LOW (ref 150–400)
RBC: 3.96 MIL/uL — ABNORMAL LOW (ref 4.22–5.81)
RDW: 11.9 % (ref 11.5–15.5)
WBC: 4.2 10*3/uL (ref 4.0–10.5)
nRBC: 0 % (ref 0.0–0.2)

## 2018-10-13 LAB — BASIC METABOLIC PANEL
Anion gap: 8 (ref 5–15)
BUN: 11 mg/dL (ref 8–23)
CHLORIDE: 105 mmol/L (ref 98–111)
CO2: 28 mmol/L (ref 22–32)
Calcium: 8.9 mg/dL (ref 8.9–10.3)
Creatinine, Ser: 0.62 mg/dL (ref 0.61–1.24)
GFR calc Af Amer: >60 mL/min (ref >60)
GFR calc non Af Amer: >60 mL/min (ref >60)
Glucose, Bld: 88 mg/dL (ref 70–99)
Potassium: 3.6 mmol/L (ref 3.5–5.1)
Sodium: 141 mmol/L (ref 135–145)

## 2018-10-13 LAB — RPR: RPR Ser Ql: NONREACTIVE

## 2018-10-13 MED ORDER — LORAZEPAM 1 MG PO TABS: ORAL_TABLET | ORAL | 0 refills | Status: DC
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
## 2018-10-13 NOTE — Care Management (Signed)
Discharge to home today per Dr. Brett Albino. Discussed home health agencies with sister and patient. States that he had [facility] last July. Would like to have them again. Will update Floydene Flock, [facility] representative. Shelbie Ammons RN MSN CCM Care Management [phone]

## 2018-10-13 NOTE — Progress Notes (Signed)
OT Cancellation Note  Patient Details Name: Douglas Peterson. MRN: [id number] DOB: 1951/07/24   Cancelled Treatment:    Reason Eval/Treat Not Completed: Other (comment). Order received, chart reviewed. Pt working with PT. Will re-attempt once pt is available and medically appropriate.   Jeni Salles, MPH, MS, OTR/L ascom [phone] 10/13/18, 9:52 AM

## 2018-10-13 NOTE — Discharge Instructions (Signed)
It was so nice to meet you during this hospitalization!  You came into the hospital because you were having confusion. We think this was caused by the librium. Please STOP taking this medication at home. I have prescribed some ativan to help with the alcohol withdrawal. Please take the ativan as below: 1. Take 1 tablet three times a day for 3 days 2. Take 1 tablet twice a day for 3 days 3. Take 1/2 tablet twice a day for 3 days 4. Take 1/2 tablet once a day for 3 days  -Dr. Brett Albino
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
## 2018-11-15 ENCOUNTER — Other Ambulatory Visit: Payer: Self-pay

## 2018-11-15 ENCOUNTER — Emergency Department: Payer: Medicare HMO

## 2018-11-15 ENCOUNTER — Encounter: Payer: Self-pay | Admitting: Emergency Medicine

## 2018-11-15 ENCOUNTER — Inpatient Hospital Stay
Admission: EM | Admit: 2018-11-15 | Discharge: 2018-11-23 | DRG: 478 | Disposition: A | Discharge status: Skilled Nursing Facility | Payer: Medicare HMO | Attending: Internal Medicine | Admitting: Internal Medicine

## 2018-11-15 DIAGNOSIS — Z87891 Personal history of nicotine dependence: Secondary | ICD-10-CM | POA: Diagnosis not present

## 2018-11-15 DIAGNOSIS — S32020A Wedge compression fracture of second lumbar vertebra, initial encounter for closed fracture: Principal | ICD-10-CM | POA: Diagnosis present

## 2018-11-15 DIAGNOSIS — Z801 Family history of malignant neoplasm of trachea, bronchus and lung: Secondary | ICD-10-CM

## 2018-11-15 DIAGNOSIS — M545 Low back pain, unspecified: Secondary | ICD-10-CM | POA: Diagnosis present

## 2018-11-15 DIAGNOSIS — M19011 Primary osteoarthritis, right shoulder: Secondary | ICD-10-CM | POA: Diagnosis present

## 2018-11-15 DIAGNOSIS — K709 Alcoholic liver disease, unspecified: Secondary | ICD-10-CM | POA: Diagnosis present

## 2018-11-15 DIAGNOSIS — F1020 Alcohol dependence, uncomplicated: Secondary | ICD-10-CM | POA: Diagnosis present

## 2018-11-15 DIAGNOSIS — Y92008 Other place in unspecified non-institutional (private) residence as the place of occurrence of the external cause: Secondary | ICD-10-CM

## 2018-11-15 DIAGNOSIS — W010XXA Fall on same level from slipping, tripping and stumbling without subsequent striking against object, initial encounter: Secondary | ICD-10-CM | POA: Diagnosis not present

## 2018-11-15 DIAGNOSIS — R52 Pain, unspecified: Secondary | ICD-10-CM

## 2018-11-15 DIAGNOSIS — M62838 Other muscle spasm: Secondary | ICD-10-CM | POA: Diagnosis present

## 2018-11-15 DIAGNOSIS — S42121A Displaced fracture of acromial process, right shoulder, initial encounter for closed fracture: Secondary | ICD-10-CM | POA: Diagnosis present

## 2018-11-15 DIAGNOSIS — W19XXXA Unspecified fall, initial encounter: Secondary | ICD-10-CM

## 2018-11-15 DIAGNOSIS — K5900 Constipation, unspecified: Secondary | ICD-10-CM | POA: Diagnosis not present

## 2018-11-15 DIAGNOSIS — Z23 Encounter for immunization: Secondary | ICD-10-CM | POA: Diagnosis present

## 2018-11-15 DIAGNOSIS — Z79899 Other long term (current) drug therapy: Secondary | ICD-10-CM | POA: Diagnosis not present

## 2018-11-15 DIAGNOSIS — Z7982 Long term (current) use of aspirin: Secondary | ICD-10-CM

## 2018-11-15 DIAGNOSIS — I10 Essential (primary) hypertension: Secondary | ICD-10-CM | POA: Diagnosis present

## 2018-11-15 DIAGNOSIS — E871 Hypo-osmolality and hyponatremia: Secondary | ICD-10-CM | POA: Diagnosis present

## 2018-11-15 DIAGNOSIS — Z419 Encounter for procedure for purposes other than remedying health state, unspecified: Secondary | ICD-10-CM

## 2018-11-15 DIAGNOSIS — S42124D Nondisplaced fracture of acromial process, right shoulder, subsequent encounter for fracture with routine healing: Secondary | ICD-10-CM | POA: Insufficient documentation

## 2018-11-15 DIAGNOSIS — Z8249 Family history of ischemic heart disease and other diseases of the circulatory system: Secondary | ICD-10-CM

## 2018-11-15 DIAGNOSIS — S32020D Wedge compression fracture of second lumbar vertebra, subsequent encounter for fracture with routine healing: Secondary | ICD-10-CM | POA: Insufficient documentation

## 2018-11-15 LAB — BASIC METABOLIC PANEL
Anion gap: 10 (ref 5–15)
BUN: 16 mg/dL (ref 8–23)
CO2: 25 mmol/L (ref 22–32)
Calcium: 9.2 mg/dL (ref 8.9–10.3)
Chloride: 107 mmol/L (ref 98–111)
Creatinine, Ser: 0.54 mg/dL — ABNORMAL LOW (ref 0.61–1.24)
GFR calc Af Amer: >60 mL/min (ref >60)
GFR calc non Af Amer: >60 mL/min (ref >60)
Glucose, Bld: 91 mg/dL (ref 70–99)
POTASSIUM: 3.8 mmol/L (ref 3.5–5.1)
Sodium: 142 mmol/L (ref 135–145)

## 2018-11-15 LAB — CBC
HEMATOCRIT: 44.2 % (ref 39.0–52.0)
HEMOGLOBIN: 14.9 g/dL (ref 13.0–17.0)
MCH: 32.5 pg (ref 26.0–34.0)
MCHC: 33.7 g/dL (ref 30.0–36.0)
MCV: 96.3 fL (ref 80.0–100.0)
Platelets: 120 10*3/uL — ABNORMAL LOW (ref 150–400)
RBC: 4.59 MIL/uL (ref 4.22–5.81)
RDW: 11 % — ABNORMAL LOW (ref 11.5–15.5)
WBC: 9 10*3/uL (ref 4.0–10.5)
nRBC: 0 % (ref 0.0–0.2)

## 2018-11-15 MED ORDER — MORPHINE SULFATE (PF) 4 MG/ML IV SOLN
4.0000 mg | Freq: Once | INTRAVENOUS | Status: AC
  Administered 2018-11-15: 4 mg via INTRAMUSCULAR
  Filled 2018-11-15: qty 1

## 2018-11-15 MED ORDER — HYDROCODONE-ACETAMINOPHEN 5-325 MG PO TABS
1.0000 | ORAL_TABLET | ORAL | Status: DC | PRN
  Administered 2018-11-15 – 2018-11-23 (×17): 2 via ORAL
  Filled 2018-11-15 (×17): qty 2

## 2018-11-15 MED ORDER — OXYCODONE-ACETAMINOPHEN 5-325 MG PO TABS
1.0000 | ORAL_TABLET | Freq: Once | ORAL | Status: AC
  Administered 2018-11-15: 1 via ORAL
  Filled 2018-11-15: qty 1

## 2018-11-15 MED ORDER — HYDROMORPHONE HCL 1 MG/ML IJ SOLN
0.5000 mg | Freq: Once | INTRAMUSCULAR | Status: AC
  Administered 2018-11-15: 0.5 mg via INTRAVENOUS
  Filled 2018-11-15: qty 1

## 2018-11-15 NOTE — ED Notes (Signed)
Taken to X-Ray for additional films  Pt is not able to stand   Provider aware  Family at bedside

## 2018-11-15 NOTE — ED Triage Notes (Signed)
Presents via EMS s/p fall  States he slipped and fell   Having pain to right hip and mid lower back  No deformity seen

## 2018-11-15 NOTE — ED Notes (Signed)
Admitting MD in with pt

## 2018-11-15 NOTE — ED Notes (Signed)
Additional pain meds given  Awaiting brace  Pt informed
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
## 2018-11-15 NOTE — ED Notes (Signed)
meds given with juice and applesauce.

## 2018-11-15 NOTE — ED Notes (Signed)
Resting on [place], await admission.
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
## 2018-11-16 ENCOUNTER — Inpatient Hospital Stay: Payer: Medicare HMO

## 2018-11-16 ENCOUNTER — Other Ambulatory Visit: Payer: Self-pay

## 2018-11-16 LAB — BASIC METABOLIC PANEL
Anion gap: 6 (ref 5–15)
BUN: 16 mg/dL (ref 8–23)
CO2: 25 mmol/L (ref 22–32)
CREATININE: 0.62 mg/dL (ref 0.61–1.24)
Calcium: 8.6 mg/dL — ABNORMAL LOW (ref 8.9–10.3)
Chloride: 107 mmol/L (ref 98–111)
GFR calc Af Amer: >60 mL/min (ref >60)
Glucose, Bld: 122 mg/dL — ABNORMAL HIGH (ref 70–99)
Potassium: 3.7 mmol/L (ref 3.5–5.1)
Sodium: 138 mmol/L (ref 135–145)

## 2018-11-16 LAB — CBC
HCT: 42.2 % (ref 39.0–52.0)
Hemoglobin: 14.1 g/dL (ref 13.0–17.0)
MCH: 32.8 pg (ref 26.0–34.0)
MCHC: 33.4 g/dL (ref 30.0–36.0)
MCV: 98.1 fL (ref 80.0–100.0)
Platelets: 126 10*3/uL — ABNORMAL LOW (ref 150–400)
RBC: 4.3 MIL/uL (ref 4.22–5.81)
RDW: 11.1 % — AB (ref 11.5–15.5)
WBC: 6.7 10*3/uL (ref 4.0–10.5)
nRBC: 0 % (ref 0.0–0.2)

## 2018-11-16 MED ORDER — ACETAMINOPHEN 650 MG RE SUPP: 650.0000 mg | Freq: Four times a day (QID) | RECTAL | Status: DC | PRN

## 2018-11-16 MED ORDER — ONDANSETRON HCL 4 MG PO TABS: 4.0000 mg | ORAL_TABLET | Freq: Four times a day (QID) | ORAL | Status: DC | PRN

## 2018-11-16 MED ORDER — ATENOLOL 25 MG PO TABS
25.0000 mg | ORAL_TABLET | Freq: Every day | ORAL | Status: DC
  Administered 2018-11-16 – 2018-11-22 (×8): 25 mg via ORAL
  Filled 2018-11-16 (×8): qty 1

## 2018-11-16 MED ORDER — SODIUM CHLORIDE 0.9 % IV SOLN
INTRAVENOUS | Status: DC
  Administered 2018-11-16: 01:00:00 via INTRAVENOUS

## 2018-11-16 MED ORDER — HYDROMORPHONE HCL 1 MG/ML IJ SOLN
2.0000 mg | INTRAMUSCULAR | Status: DC | PRN
  Filled 2018-11-16: qty 2

## 2018-11-16 MED ORDER — INFLUENZA VAC SPLIT HIGH-DOSE 0.5 ML IM SUSY
0.5000 mL | PREFILLED_SYRINGE | INTRAMUSCULAR | Status: AC
  Administered 2018-11-18: 0.5 mL via INTRAMUSCULAR
  Filled 2018-11-16: qty 0.5

## 2018-11-16 MED ORDER — BACLOFEN 10 MG PO TABS
10.0000 mg | ORAL_TABLET | Freq: Two times a day (BID) | ORAL | Status: DC
  Administered 2018-11-16 – 2018-11-22 (×14): 10 mg via ORAL
  Filled 2018-11-16 (×15): qty 1

## 2018-11-16 MED ORDER — POLYETHYLENE GLYCOL 3350 17 G PO PACK
17.0000 g | PACK | Freq: Every day | ORAL | Status: DC | PRN
  Administered 2018-11-17: 17 g via ORAL
  Filled 2018-11-16: qty 1

## 2018-11-16 MED ORDER — ACETAMINOPHEN 325 MG PO TABS: 650.0000 mg | ORAL_TABLET | Freq: Four times a day (QID) | ORAL | Status: DC | PRN

## 2018-11-16 MED ORDER — LORAZEPAM 2 MG/ML IJ SOLN: 1.0000 mg | Freq: Four times a day (QID) | INTRAMUSCULAR | Status: DC | PRN

## 2018-11-16 MED ORDER — LACTULOSE 10 GM/15ML PO SOLN
20.0000 g | Freq: Every day | ORAL | Status: DC
  Administered 2018-11-16 – 2018-11-20 (×5): 20 g via ORAL
  Filled 2018-11-16 (×5): qty 30

## 2018-11-16 MED ORDER — DOCUSATE SODIUM 100 MG PO CAPS
100.0000 mg | ORAL_CAPSULE | Freq: Two times a day (BID) | ORAL | Status: DC
  Administered 2018-11-16 – 2018-11-18 (×6): 100 mg via ORAL
  Filled 2018-11-16 (×6): qty 1

## 2018-11-16 MED ORDER — ONDANSETRON HCL 4 MG/2ML IJ SOLN: 4.0000 mg | Freq: Four times a day (QID) | INTRAMUSCULAR | Status: DC | PRN

## 2018-11-16 MED ORDER — ASPIRIN EC 81 MG PO TBEC
81.0000 mg | DELAYED_RELEASE_TABLET | Freq: Every day | ORAL | Status: DC
  Administered 2018-11-16 – 2018-11-23 (×8): 81 mg via ORAL
  Filled 2018-11-16 (×8): qty 1

## 2018-11-16 MED ORDER — ENOXAPARIN SODIUM 40 MG/0.4ML ~~LOC~~ SOLN: 40.0000 mg | SUBCUTANEOUS | Status: DC

## 2018-11-16 MED ORDER — ENOXAPARIN SODIUM 40 MG/0.4ML ~~LOC~~ SOLN
40.0000 mg | SUBCUTANEOUS | Status: DC
  Administered 2018-11-16: 40 mg via SUBCUTANEOUS
  Filled 2018-11-16: qty 0.4

## 2018-11-16 MED ORDER — PAROXETINE HCL 20 MG PO TABS
20.0000 mg | ORAL_TABLET | Freq: Every day | ORAL | Status: DC
  Administered 2018-11-16 – 2018-11-23 (×8): 20 mg via ORAL
  Filled 2018-11-16 (×8): qty 1

## 2018-11-16 MED ORDER — DILTIAZEM HCL ER COATED BEADS 120 MG PO CP24
120.0000 mg | ORAL_CAPSULE | Freq: Two times a day (BID) | ORAL | Status: DC
  Administered 2018-11-16 – 2018-11-23 (×14): 120 mg via ORAL
  Filled 2018-11-16 (×16): qty 1

## 2018-11-16 NOTE — NC FL2 (Signed)
[place] LEVEL OF CARE SCREENING TOOL     IDENTIFICATION  Patient Name: Douglas Peterson. Birthdate: Apr 13, 1951 Sex: male Admission Date (Current Location): 11/15/2018  [place] and [place] Number:  Engineering geologist and Address:  [facility], [address], [place], [place] [address]      Provider Number: [id number]  Attending Physician Name and Address:  Demetrios Loll, MD  Relative Name and Phone Number:       Current Level of Care: Hospital Recommended Level of Care: [place] Prior Approval Number:    Date Approved/Denied:   PASRR Number: ([id number])  Discharge Plan: SNF    Current Diagnoses: Patient Active Problem List   Diagnosis Date Noted  . Low back pain 11/15/2018  . AMS (altered mental status) 10/12/2018  . Delirium tremens ([place]) 03/08/2018  . Malnutrition of moderate degree 11/24/2017  . HTN (hypertension) 09/16/2017  . Hypothermia 12/17/2016  . Alcohol use   . Diarrhea   . Hyponatremia 07/09/2015    Orientation RESPIRATION BLADDER Height & Weight     Self, Time, Situation, Place  Normal Continent Weight: 114 lb 3.2 oz (51.8 kg) Height:  5' 4"  (162.6 cm)  BEHAVIORAL SYMPTOMS/MOOD NEUROLOGICAL BOWEL NUTRITION STATUS      Continent Diet(Diet: Regular )  AMBULATORY STATUS COMMUNICATION OF NEEDS Skin   Extensive Assist Verbally Normal                       Personal Care Assistance Level of Assistance  Bathing, Feeding, Dressing Bathing Assistance: Limited assistance Feeding assistance: Independent Dressing Assistance: Limited assistance     Functional Limitations Info  Sight, Hearing, Speech Sight Info: Adequate Hearing Info: Adequate Speech Info: Adequate    SPECIAL CARE FACTORS FREQUENCY  PT (By licensed PT), OT (By licensed OT)     PT Frequency: (5) OT Frequency: (5)            Contractures      Additional Factors Info  Code Status, Allergies Code Status Info:  (Full Code. ) Allergies Info: (No Known Allergies. )           Current Medications (11/16/2018):  This is the current hospital active medication list Current Facility-Administered Medications  Medication Dose Route Frequency Provider Last Rate Last Dose  . acetaminophen (TYLENOL) tablet 650 mg  650 mg Oral Q6H PRN Gladstone Lighter, MD       Or  . acetaminophen (TYLENOL) suppository 650 mg  650 mg Rectal Q6H PRN Gladstone Lighter, MD      . aspirin EC tablet 81 mg  81 mg Oral Daily Gladstone Lighter, MD   81 mg at 11/16/18 0955  . atenolol (TENORMIN) tablet 25 mg  25 mg Oral QHS Gladstone Lighter, MD   25 mg at 11/16/18 0157  . baclofen (LIORESAL) tablet 10 mg  10 mg Oral BID Gladstone Lighter, MD   10 mg at 11/16/18 0956  . diltiazem (CARDIZEM CD) 24 hr capsule 120 mg  120 mg Oral BID Gladstone Lighter, MD   120 mg at 11/16/18 0956  . docusate sodium (COLACE) capsule 100 mg  100 mg Oral BID Gladstone Lighter, MD   100 mg at 11/16/18 0955  . enoxaparin (LOVENOX) injection 40 mg  40 mg Subcutaneous Q24H Gladstone Lighter, MD      . HYDROcodone-acetaminophen (NORCO/VICODIN) 5-325 MG per tablet 1-2 tablet  1-2 tablet Oral Q4H PRN Gladstone Lighter, MD   2 tablet at 11/16/18 1633  .  HYDROmorphone (DILAUDID) injection 2 mg  2 mg Intravenous Q4H PRN Gladstone Lighter, MD      . Derrill Memo ON 11/17/2018] Influenza vac split quadrivalent PF (FLUZONE HIGH-DOSE) injection 0.5 mL  0.5 mL Intramuscular Tomorrow-1000 Demetrios Loll, MD      . lactulose ([place]) 10 GM/15ML solution 20 g  20 g Oral Daily Gladstone Lighter, MD   20 g at 11/16/18 0956  . LORazepam (ATIVAN) injection 1 mg  1 mg Intravenous Q6H PRN Gladstone Lighter, MD      . ondansetron (ZOFRAN) tablet 4 mg  4 mg Oral Q6H PRN Gladstone Lighter, MD       Or  . ondansetron (ZOFRAN) injection 4 mg  4 mg Intravenous Q6H PRN Gladstone Lighter, MD      . PARoxetine (PAXIL) tablet 20 mg  20 mg Oral Daily Gladstone Lighter, MD   20 mg at  11/16/18 0955  . polyethylene glycol (MIRALAX / GLYCOLAX) packet 17 g  17 g Oral Daily PRN Gladstone Lighter, MD         Discharge Medications: Please see discharge summary for a list of discharge medications.  Relevant Imaging Results:  Relevant Lab Results:   Additional Information (SSN: [id number])  , Veronia Beets, LCSW
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
## 2018-11-16 NOTE — Plan of Care (Signed)
  Problem: Education: Goal: Knowledge of General Education information will improve Description Including pain rating scale, medication(s)/side effects and non-pharmacologic comfort measures Outcome: Progressing   

## 2018-11-17 LAB — HIV ANTIBODY (ROUTINE TESTING W REFLEX): HIV Screen 4th Generation wRfx: NONREACTIVE

## 2018-11-17 MED ORDER — ADULT MULTIVITAMIN W/MINERALS CH
1.0000 | ORAL_TABLET | Freq: Every day | ORAL | Status: DC
  Administered 2018-11-17 – 2018-11-18 (×2): 1 via ORAL
  Filled 2018-11-17 (×2): qty 1

## 2018-11-17 MED ORDER — CEFAZOLIN (ANCEF) 1 G IV SOLR: 1.0000 g | INTRAVENOUS | Status: DC

## 2018-11-17 MED ORDER — BISACODYL 5 MG PO TBEC
5.0000 mg | DELAYED_RELEASE_TABLET | Freq: Every day | ORAL | Status: DC | PRN
  Administered 2018-11-17: 5 mg via ORAL
  Filled 2018-11-17: qty 1

## 2018-11-17 MED ORDER — VITAMIN C 500 MG PO TABS
250.0000 mg | ORAL_TABLET | Freq: Every day | ORAL | Status: DC
  Administered 2018-11-18: 250 mg via ORAL
  Filled 2018-11-17: qty 1

## 2018-11-17 MED ORDER — CEFAZOLIN SODIUM-DEXTROSE 1-4 GM/50ML-% IV SOLN
1.0000 g | Freq: Once | INTRAVENOUS | Status: AC
  Administered 2018-11-18: 1 g via INTRAVENOUS
  Filled 2018-11-17: qty 50

## 2018-11-17 MED ORDER — FOLIC ACID 1 MG PO TABS
0.5000 mg | ORAL_TABLET | Freq: Every day | ORAL | Status: DC
  Administered 2018-11-17 – 2018-11-18 (×2): 0.5 mg via ORAL
  Filled 2018-11-17 (×2): qty 1

## 2018-11-17 MED ORDER — FLEET ENEMA 7-19 GM/118ML RE ENEM
1.0000 | ENEMA | Freq: Every day | RECTAL | Status: DC | PRN
  Administered 2018-11-18: 1 via RECTAL
  Filled 2018-11-17: qty 1
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
## 2018-11-17 NOTE — Clinical Social Work Placement (Signed)
   CLINICAL SOCIAL WORK PLACEMENT  NOTE  Date:  11/17/2018  Patient Details  Name: Douglas Peterson. MRN: [id number] Date of Birth: 04-Jun-1951  Clinical Social Work is seeking post-discharge placement for this patient at the [facility] level of care (*CSW will initial, date and re-position this form in  chart as items are completed):  Yes   Patient/family provided with [facility] Work Department's list of facilities offering this level of care within the geographic area requested by the patient (or if unable, by the patient's family).  Yes   Patient/family informed of their freedom to choose among providers that offer the needed level of care, that participate in Medicare, Medicaid or managed care program needed by the patient, have an available bed and are willing to accept the patient.  Yes   Patient/family informed of [facility]'s ownership interest in [facility] and [facility], as well as of the fact that they are under no obligation to receive care at these facilities.  PASRR submitted to EDS on       PASRR number received on       Existing PASRR number confirmed on 11/16/18     FL2 transmitted to all facilities in geographic area requested by pt/family on 11/16/18     FL2 transmitted to all facilities within larger geographic area on       Patient informed that his/her managed care company has contracts with or will negotiate with certain facilities, including the following:        Yes   Patient/family informed of bed offers received.  Patient chooses bed at ([facility] )     Physician recommends and patient chooses bed at      Patient to be transferred to   on  .  Patient to be transferred to facility by       Patient family notified on   of transfer.  Name of family member notified:        PHYSICIAN       Additional Comment:    _______________________________________________ , Veronia Beets, LCSW 11/17/2018, 9:19 AM

## 2018-11-17 NOTE — Clinical Social Work Note (Signed)
Clinical Social Work Assessment  Patient Details  Name: Douglas Peterson. MRN: [id number] Date of Birth: 19-Mar-1951  Date of referral:  11/17/18               Reason for consult:  Facility Placement                Permission sought to share information with:  Chartered certified accountant granted to share information::  Yes, Verbal Permission Granted  Name::      [place]::   [place]   Relationship::     Contact Information:     Housing/Transportation Living arrangements for the past 2 months:  [place] of Information:  Patient Patient Interpreter Needed:  None Criminal Activity/Legal Involvement Pertinent to Current Situation/Hospitalization:  No - Comment as needed Significant Relationships:  Adult Children Lives with:  Self Do you feel safe going back to the place where you live?  Yes Need for family participation in patient care:  Yes (Comment)  Care giving concerns:  Patient lives alone in [place].    Facilities manager / plan:  Holiday representative (Doug[place]) received verbal SNF consult from PT. CSW met with patient alone at bedside this morning. Patient was alert and oriented X4 and was laying in the bed. CSW introduced self and explained role of CSW department. Per patient he lives alone in [place] and his daughter [place] is his primary support. Per patient [place] lives in [place]. Patient reported that he does not have a HPOA. CSW explained SNF process and that [name] will have to approve SNF. Patient requested to go to [facility] and stated that he has been there before under private pay. Per patient he had to pay [facility] $8,000 because he did not have a 3 night inpatient stay in the hospital. CSW explained that patient now has a managed medicare plan through [facility] which does not require a 3 night inpatient stay in a hospital. CSW explained that [name] does require authorization. Patient reported that if [name] denies SNF he  can't afford to pay privately for SNF this time. CSW explained that if [name] denies SNF and patient can't pay privately he may have to D/C home with home health. Patient reported that he can't go home right now because he can't walk. CSW provided emotional support and encouraged patient to continue to work hard with PT. [name] agreeable to SNF search in [facility]. FL2 complete and faxed out.   CSW presented bed offer to patient. Patient only had 1 bed offer [facility]. Patient accepted bed offer. Per Otila Kluver [facility] liaison she will start [facility] authorization today. CSW will continue to follow and assist as needed.   Employment status:  Disabled (Comment on whether or not currently receiving Disability), Retired Nurse, adult PT Recommendations:  [place] / Referral to community resources:  [place]  Patient/Family's Response to care:  Patient accepted bed offer from [facility].   Patient/Family's Understanding of and Emotional Response to Diagnosis, Current Treatment, and Prognosis:  Patient was very pleasant and thanked CSW for assistance.   Emotional Assessment Appearance:  Appears stated age Attitude/Demeanor/Rapport:    Affect (typically observed):  Accepting, Adaptable, Pleasant Orientation:  Oriented to Self, Oriented to Place, Oriented to  Time, Oriented to Situation Alcohol / Substance use:  Not Applicable Psych involvement (Current and /or in the community):  No (Comment)  Discharge Needs  Concerns to be addressed:  Discharge Planning Concerns Readmission within the last 30 days:  No Current discharge risk:  Dependent with Mobility Barriers to Discharge:  Continued Medical Work up   [employer], Veronia Beets, LCSW 11/17/2018, 9:20 AM
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
## 2018-11-18 ENCOUNTER — Inpatient Hospital Stay: Payer: Medicare HMO | Admitting: Anesthesiology

## 2018-11-18 ENCOUNTER — Encounter
Admission: EM | Disposition: A | Discharge status: Skilled Nursing Facility | Payer: Self-pay | Source: Home / Self Care | Attending: Internal Medicine

## 2018-11-18 ENCOUNTER — Inpatient Hospital Stay: Payer: Medicare HMO

## 2018-11-18 ENCOUNTER — Encounter: Payer: Self-pay | Admitting: Anesthesiology

## 2018-11-18 HISTORY — PX: KYPHOPLASTY: SHX5884

## 2018-11-18 LAB — MRSA PCR SCREENING: MRSA by PCR: NEGATIVE

## 2018-11-18 SURGERY — KYPHOPLASTY
Anesthesia: General | Site: Back

## 2018-11-18 MED ORDER — LIDOCAINE HCL (PF) 2 % IJ SOLN
INTRAMUSCULAR | Status: AC
  Filled 2018-11-18: qty 10

## 2018-11-18 MED ORDER — FENTANYL CITRATE (PF) 100 MCG/2ML IJ SOLN
INTRAMUSCULAR | Status: AC
  Filled 2018-11-18: qty 2

## 2018-11-18 MED ORDER — PHENYLEPHRINE HCL 10 MG/ML IJ SOLN
INTRAMUSCULAR | Status: AC
  Filled 2018-11-18: qty 1

## 2018-11-18 MED ORDER — FENTANYL CITRATE (PF) 100 MCG/2ML IJ SOLN: 25.0000 ug | INTRAMUSCULAR | Status: DC | PRN

## 2018-11-18 MED ORDER — FENTANYL CITRATE (PF) 100 MCG/2ML IJ SOLN
INTRAMUSCULAR | Status: DC | PRN
  Administered 2018-11-18: 25 ug via INTRAVENOUS

## 2018-11-18 MED ORDER — MAGNESIUM HYDROXIDE 400 MG/5ML PO SUSP: 30.0000 mL | Freq: Every day | ORAL | Status: DC | PRN

## 2018-11-18 MED ORDER — BUPIVACAINE-EPINEPHRINE (PF) 0.5% -1:200000 IJ SOLN
INTRAMUSCULAR | Status: DC | PRN
  Administered 2018-11-18: 10 mL

## 2018-11-18 MED ORDER — METOCLOPRAMIDE HCL 5 MG/ML IJ SOLN: 5.0000 mg | Freq: Three times a day (TID) | INTRAMUSCULAR | Status: DC | PRN

## 2018-11-18 MED ORDER — MIDAZOLAM HCL 2 MG/2ML IJ SOLN
INTRAMUSCULAR | Status: AC
  Filled 2018-11-18: qty 2

## 2018-11-18 MED ORDER — FOLIC ACID 1 MG PO TABS
500.0000 ug | ORAL_TABLET | Freq: Every day | ORAL | Status: DC
  Administered 2018-11-19 – 2018-11-23 (×5): 0.5 mg via ORAL
  Filled 2018-11-18 (×5): qty 1

## 2018-11-18 MED ORDER — LIDOCAINE HCL 1 % IJ SOLN
INTRAMUSCULAR | Status: DC | PRN
  Administered 2018-11-18: 20 mL

## 2018-11-18 MED ORDER — LACTATED RINGERS IV SOLN
INTRAVENOUS | Status: DC | PRN
  Administered 2018-11-18: 08:00:00 via INTRAVENOUS

## 2018-11-18 MED ORDER — PROPOFOL 500 MG/50ML IV EMUL
INTRAVENOUS | Status: AC
  Filled 2018-11-18: qty 50

## 2018-11-18 MED ORDER — ADULT MULTIVITAMIN W/MINERALS CH
1.0000 | ORAL_TABLET | Freq: Every day | ORAL | Status: DC
  Administered 2018-11-19 – 2018-11-23 (×5): 1 via ORAL
  Filled 2018-11-18 (×6): qty 1

## 2018-11-18 MED ORDER — PROPOFOL 500 MG/50ML IV EMUL
INTRAVENOUS | Status: DC | PRN
  Administered 2018-11-18: 45 ug/kg/min via INTRAVENOUS

## 2018-11-18 MED ORDER — OXYCODONE HCL 5 MG PO TABS: 5.0000 mg | ORAL_TABLET | Freq: Once | ORAL | Status: DC | PRN

## 2018-11-18 MED ORDER — MIDAZOLAM HCL 2 MG/2ML IJ SOLN
INTRAMUSCULAR | Status: DC | PRN
  Administered 2018-11-18: 2 mg via INTRAVENOUS

## 2018-11-18 MED ORDER — LIDOCAINE HCL (CARDIAC) PF 100 MG/5ML IV SOSY
PREFILLED_SYRINGE | INTRAVENOUS | Status: DC | PRN
  Administered 2018-11-18: 50 mg via INTRAVENOUS

## 2018-11-18 MED ORDER — DOCUSATE SODIUM 100 MG PO CAPS
100.0000 mg | ORAL_CAPSULE | Freq: Two times a day (BID) | ORAL | Status: DC
  Administered 2018-11-18 – 2018-11-23 (×10): 100 mg via ORAL
  Filled 2018-11-18 (×10): qty 1

## 2018-11-18 MED ORDER — OXYCODONE HCL 5 MG/5ML PO SOLN: 5.0000 mg | Freq: Once | ORAL | Status: DC | PRN

## 2018-11-18 MED ORDER — FLEET ENEMA 7-19 GM/118ML RE ENEM: 1.0000 | ENEMA | Freq: Once | RECTAL | Status: DC | PRN

## 2018-11-18 MED ORDER — VITAMIN C 500 MG PO TABS
250.0000 mg | ORAL_TABLET | Freq: Every day | ORAL | Status: DC
  Administered 2018-11-19 – 2018-11-23 (×5): 250 mg via ORAL
  Filled 2018-11-18 (×5): qty 1

## 2018-11-18 MED ORDER — METOCLOPRAMIDE HCL 10 MG PO TABS: 5.0000 mg | ORAL_TABLET | Freq: Three times a day (TID) | ORAL | Status: DC | PRN

## 2018-11-18 MED ORDER — IOPAMIDOL (ISOVUE-M 200) INJECTION 41%
INTRAMUSCULAR | Status: DC | PRN
  Administered 2018-11-18: 20 mL

## 2018-11-18 SURGICAL SUPPLY — 17 items
CEMENT KYPHON CX01A KIT/MIXER (Cement) ×3 IMPLANT
COVER WAND RF STERILE (DRAPES) ×3 IMPLANT
DERMABOND ADVANCED (GAUZE/BANDAGES/DRESSINGS) ×2
DERMABOND ADVANCED .7 DNX12 (GAUZE/BANDAGES/DRESSINGS) ×1 IMPLANT
DEVICE BIOPSY BONE KYPHX (INSTRUMENTS) ×3 IMPLANT
DRAPE C-ARM XRAY 36X54 (DRAPES) ×3 IMPLANT
DURAPREP 26ML APPLICATOR (WOUND CARE) ×3 IMPLANT
GLOVE SURG SYN 9.0  PF PI (GLOVE) ×2
GLOVE SURG SYN 9.0 PF PI (GLOVE) ×1 IMPLANT
GOWN SRG 2XL LVL 4 RGLN SLV (GOWNS) ×1 IMPLANT
GOWN STRL NON-REIN 2XL LVL4 (GOWNS) ×2
GOWN STRL REUS W/ TWL LRG LVL3 (GOWN DISPOSABLE) ×1 IMPLANT
GOWN STRL REUS W/TWL LRG LVL3 (GOWN DISPOSABLE) ×2
PACK KYPHOPLASTY (MISCELLANEOUS) ×3 IMPLANT
STRAP SAFETY 5IN WIDE (MISCELLANEOUS) ×3 IMPLANT
TRAY KYPHOPAK 15/3 EXPRESS 1ST (MISCELLANEOUS) ×3 IMPLANT
TRAY KYPHOPAK 20/3 EXPRESS 1ST (MISCELLANEOUS) ×1 IMPLANT
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
## 2018-11-18 NOTE — Anesthesia Post-op Follow-up Note (Signed)
Anesthesia QCDR form completed.        

## 2018-11-18 NOTE — Op Note (Signed)
Date  11/18/2018  Time 8:25   PATIENT: Douglas Peterson, Douglas Peterson   PRE-OPERATIVE DIAGNOSIS:  closed wedge compression fracture of L2   POST-OPERATIVE DIAGNOSIS:  closed wedge compression fracture of L2   PROCEDURE:  Procedure(s): KYPHOPLASTY L2  SURGEON: Laurene Footman, MD   ASSISTANTS: None   ANESTHESIA:   local and MAC   EBL:  No intake/output data recorded.   BLOOD ADMINISTERED:none   DRAINS: none    LOCAL MEDICATIONS USED:  MARCAINE    and XYLOCAINE    SPECIMEN:   L2 vertebral body   DISPOSITION OF SPECIMEN:  Pathology   COUNTS:  YES   TOURNIQUET:  * No tourniquets in log *   IMPLANTS: Bone cement   DICTATION: .Dragon Dictation  patient was brought to the operating room and after adequate anesthesia was obtained the patient was placed prone.  C arm was brought in in good visualization of the affected level obtained on both AP and lateral projections.  After patient identification and timeout procedures were completed, local anesthetic was infiltrated with 10 cc 1% Xylocaine infiltrated subcutaneously.  This is done the area on the right side of the planned approach.  The back was then prepped and draped in the usual sterile manner and repeat timeout procedure carried out.  A spinal needle was brought down to the pedicle on the right side of  to and a 50-50 mix of 1% Xylocaine half percent Sensorcaine with epinephrine total of 20 cc injected.  After allowing this to set a small incision was made and the trocar was advanced into the vertebral body in an extrapedicular fashion.  Biopsy was seen Drilling was carried out balloon inserted with inflation to  for cc.  When the cement was appropriate consistency 5-1/2 cc were injected into the vertebral body without extravasation, good fill superior to inferior endplates and from right to left sides along the inferior endplate.  After the cement had set the trochar was removed and permanent C-arm views obtained.  The wound was closed with  Dermabond followed by Band-Aid   PLAN OF CARE:  Continue as inpatient   PATIENT DISPOSITION:  PACU - hemodynamically stable.

## 2018-11-18 NOTE — Anesthesia Postprocedure Evaluation (Signed)
Anesthesia Post Note  Patient: Eidan Muellner.  Procedure(s) Performed: KYPHOPLASTY L2 (N/A Back)  Patient location during evaluation: PACU Anesthesia Type: General Level of consciousness: awake and alert Pain management: pain level controlled Vital Signs Assessment: post-procedure vital signs reviewed and stable Respiratory status: spontaneous breathing, nonlabored ventilation, respiratory function stable and patient connected to nasal cannula oxygen Cardiovascular status: blood pressure returned to baseline and stable Postop Assessment: no apparent nausea or vomiting Anesthetic complications: no     Last Vitals:  Vitals:   11/18/18 0928 11/18/18 1008  BP: 121/74 129/77  Pulse: (!) 53 (!) 53  Resp: 15 16  Temp: 36.7 C 36.7 C  SpO2: 98% 96%    Last Pain:  Vitals:   11/18/18 0928  TempSrc: Tympanic  PainSc:                  Precious Haws 

## 2018-11-18 NOTE — Care Management Important Message (Signed)
Important Message  Patient Details  Name: Douglas Peterson. MRN: [id number] Date of Birth: 1951-08-23   Medicare Important Message Given:  Yes    Juliann Pulse A  11/18/2018, 12:22 PM

## 2018-11-18 NOTE — Progress Notes (Signed)
For L2 kyphoplasty today for debilitating back pain
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
## 2018-11-18 NOTE — Anesthesia Procedure Notes (Signed)
Performed by: Lance Muss, CRNA Pre-anesthesia Checklist: Patient identified, Emergency Drugs available, Suction available, Patient being monitored and Timeout performed Oxygen Delivery Method: Nasal cannula

## 2018-11-18 NOTE — Transfer of Care (Signed)
Immediate Anesthesia Transfer of Care Note  Patient: Douglas Peterson.  Procedure(s) Performed: KYPHOPLASTY L2 (N/A Back)  Patient Location: PACU  Anesthesia Type:General  Level of Consciousness: drowsy  Airway & Oxygen Therapy: Patient Spontanous Breathing and Patient connected to nasal cannula oxygen  Post-op Assessment: Report given to RN and Post -op Vital signs reviewed and stable  Post vital signs: Reviewed and stable  Last Vitals:  Vitals Value Taken Time  BP 124/84 11/18/2018  8:21 AM  Temp 36.4 C 11/18/2018  8:20 AM  Pulse 58 11/18/2018  8:21 AM  Resp 11 11/18/2018  8:21 AM  SpO2 94 % 11/18/2018  8:21 AM  Vitals shown include unvalidated device data.  Last Pain:  Vitals:   11/18/18 0348  TempSrc:   PainSc: 0-No pain      Patients Stated Pain Goal: 1 (45/99/77 4142)  Complications: No apparent anesthesia complications
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
## 2018-11-19 ENCOUNTER — Inpatient Hospital Stay: Payer: Medicare HMO

## 2018-11-19 ENCOUNTER — Encounter: Payer: Self-pay | Admitting: Orthopedic Surgery

## 2018-11-19 LAB — SURGICAL PATHOLOGY

## 2018-11-19 MED ORDER — ENOXAPARIN SODIUM 40 MG/0.4ML ~~LOC~~ SOLN
40.0000 mg | SUBCUTANEOUS | Status: DC
  Administered 2018-11-19 – 2018-11-22 (×4): 40 mg via SUBCUTANEOUS
  Filled 2018-11-19 (×4): qty 0.4

## 2018-11-19 NOTE — Care Management (Signed)
Patient stated that he does need a 3 in 1, notified [facility] of need

## 2018-11-19 NOTE — Care Management (Signed)
Corene Cornea with [facility] has accepted the patient for [facility] PT
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
## 2018-11-19 NOTE — Care Management Note (Signed)
Case Management Note  Patient Details  Name: Douglas Peterson. MRN: [id number] Date of Birth: 1951-08-24  Subjective/Objective:                  Met with patient to discuss discharge plan Patient lives alone Patient has a cane and a walker Patient currently has [facility] and would like to use them again for [facility] PT Patient states he does not need any DME equipment at this time Patient has transportation Patient has a neighbor that checks in on him at times Patient has a sister that checks in on him Patient uses [facility] and can afford meds Patient uses Dr. Dorthula Perfect as PCP at [facility] clinic   Action/Plan: Patient provided with [facility] list per CMS.gvo Notified Corene Cornea that patient would like to use [facility] for PT  Expected Discharge Date:                  Expected Discharge Plan:     In-House Referral:     Discharge planning Services     Post Acute Care Choice:    Choice offered to:     DME Arranged:    DME Agency:     HH Arranged:  PT [place]:  [place]  Status of Service:  In process, will continue to follow  If discussed at Long Length of Stay Meetings, dates discussed:    Additional Comments:  Su Hilt, RN 11/19/2018, 2:48 PM

## 2018-11-19 NOTE — Progress Notes (Signed)
Per [facility] liaison [employer] denied SNF. Patient did well with PT today and recommendation is home health. Per MD patient can D/C home with home health. RN case manager aware of above. [name] aware of above. Patient was taking to his sister [name] on the phone and asked CSW to make her aware of above. Per sister she does not want patient to discharge home until he can walk on his own. CSW provided emotional support and made her aware that [employer] denied SNF.  [employer], LCSW [phone]

## 2018-11-20 LAB — CBC
HCT: 40.5 % (ref 39.0–52.0)
Hemoglobin: 13.8 g/dL (ref 13.0–17.0)
MCH: 32.3 pg (ref 26.0–34.0)
MCHC: 34.1 g/dL (ref 30.0–36.0)
MCV: 94.8 fL (ref 80.0–100.0)
Platelets: 148 10*3/uL — ABNORMAL LOW (ref 150–400)
RBC: 4.27 MIL/uL (ref 4.22–5.81)
RDW: 11.3 % — ABNORMAL LOW (ref 11.5–15.5)
WBC: 8 10*3/uL (ref 4.0–10.5)
nRBC: 0 % (ref 0.0–0.2)

## 2018-11-20 MED ORDER — BISACODYL 5 MG PO TBEC
10.0000 mg | DELAYED_RELEASE_TABLET | Freq: Every day | ORAL | Status: DC | PRN
  Administered 2018-11-20 – 2018-11-21 (×2): 10 mg via ORAL
  Filled 2018-11-20 (×2): qty 2

## 2018-11-20 MED ORDER — LACTULOSE 10 GM/15ML PO SOLN
20.0000 g | Freq: Two times a day (BID) | ORAL | Status: DC
  Administered 2018-11-20 – 2018-11-23 (×3): 20 g via ORAL
  Filled 2018-11-20 (×4): qty 30

## 2018-11-20 NOTE — Progress Notes (Signed)
Pt ambulated in room and around nurses station using cane with standby assistance. Gait remains unsteady. Pt reminded to call for assistance before getting out of chair. Chair alarm is on. Pt verbalizes understanding and states that he will call for assistance before getting up. Call bell, urinal, phone, personal belongings within reach.
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
## 2018-11-22 MED ORDER — BISACODYL 10 MG RE SUPP
10.0000 mg | Freq: Every day | RECTAL | Status: DC
  Administered 2018-11-22 – 2018-11-23 (×2): 10 mg via RECTAL
  Filled 2018-11-22 (×2): qty 1

## 2018-11-22 MED ORDER — BACLOFEN 10 MG PO TABS
10.0000 mg | ORAL_TABLET | Freq: Three times a day (TID) | ORAL | Status: DC
  Administered 2018-11-22 – 2018-11-23 (×3): 10 mg via ORAL
  Filled 2018-11-22 (×5): qty 1

## 2018-11-22 NOTE — Care Management Important Message (Signed)
Important Message  Patient Details  Name: Douglas Peterson. MRN: [id number] Date of Birth: 1950-12-27   Medicare Important Message Given:  Yes    Juliann Pulse A  11/22/2018, 11:35 AM

## 2018-11-22 NOTE — Progress Notes (Signed)
MD called [name] to complete peer to peer today. MD is waiting on a call back from the [facility]. [facility] liaison is aware of above.   [employer], LCSW [phone]
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
## 2018-11-22 NOTE — Plan of Care (Signed)
°  Problem: Education: °Goal: Knowledge of General Education information will improve °Description: Including pain rating scale, medication(s)/side effects and non-pharmacologic comfort measures °Outcome: Progressing °  °Problem: Health Behavior/Discharge Planning: °Goal: Ability to manage health-related needs will improve °Outcome: Progressing °  °Problem: Clinical Measurements: °Goal: Ability to maintain clinical measurements within normal limits will improve °Outcome: Progressing °Goal: Will remain free from infection °Outcome: Progressing °Goal: Diagnostic test results will improve °Outcome: Progressing °Goal: Respiratory complications will improve °Outcome: Progressing °Goal: Cardiovascular complication will be avoided °Outcome: Progressing °  °Problem: Activity: °Goal: Risk for activity intolerance will decrease °Outcome: Progressing °  °Problem: Nutrition: °Goal: Adequate nutrition will be maintained °Outcome: Progressing °  °Problem: Coping: °Goal: Level of anxiety will decrease °Outcome: Progressing °  °Problem: Elimination: °Goal: Will not experience complications related to bowel motility °Outcome: Progressing °Goal: Will not experience complications related to urinary retention °Outcome: Progressing °  °Problem: Pain Managment: °Goal: General experience of comfort will improve °Outcome: Progressing °  °

## 2018-11-22 NOTE — Progress Notes (Signed)
Per MD [facility] medical director approved SNF after peer to peer was completed. Plan is for patient to D/C to [facility] tomorrow pending medical clearance. [name] aware of above. [facility] liaision is aware of above.   [employer], LCSW [phone]

## 2018-11-23 LAB — CREATININE, SERUM
Creatinine, Ser: 0.59 mg/dL — ABNORMAL LOW (ref 0.61–1.24)
GFR calc Af Amer: >60 mL/min (ref >60)
GFR calc non Af Amer: >60 mL/min (ref >60)

## 2018-11-23 MED ORDER — HYDROCODONE-ACETAMINOPHEN 5-325 MG PO TABS: 1.0000 | ORAL_TABLET | Freq: Four times a day (QID) | ORAL | 0 refills | Status: DC | PRN

## 2018-11-23 MED ORDER — CONSTULOSE 10 GM/15ML PO SOLN: 20.0000 g | Freq: Two times a day (BID) | ORAL | 0 refills | Status: DC

## 2018-11-23 MED ORDER — BACLOFEN 10 MG PO TABS: 10.0000 mg | ORAL_TABLET | Freq: Three times a day (TID) | ORAL | 0 refills | Status: DC

## 2018-11-23 MED ORDER — DOCUSATE SODIUM 100 MG PO CAPS: 100.0000 mg | ORAL_CAPSULE | Freq: Two times a day (BID) | ORAL | 0 refills | Status: DC

## 2018-11-23 NOTE — Clinical Social Work Placement (Signed)
   CLINICAL SOCIAL WORK PLACEMENT  NOTE  Date:  11/23/2018  Patient Details  Name: Douglas Peterson. MRN: [id number] Date of Birth: [birth year]  Clinical Social Work is seeking post-discharge placement for this patient at the [place] level of care (*CSW will initial, date and re-position this form in  chart as items are completed):  Yes   Patient/family provided with [facility] Work Department's list of facilities offering this level of care within the geographic area requested by the patient (or if unable, by the patient's family).  Yes   Patient/family informed of their freedom to choose among providers that offer the needed level of care, that participate in Medicare, Medicaid or managed care program needed by the patient, have an available bed and are willing to accept the patient.  Yes   Patient/family informed of [facility]'s ownership interest in [facility] and [facility], as well as of the fact that they are under no obligation to receive care at these facilities.  PASRR submitted to EDS on       PASRR number received on       Existing PASRR number confirmed on 11/16/18     FL2 transmitted to all facilities in geographic area requested by pt/family on 11/16/18     FL2 transmitted to all facilities within larger geographic area on       Patient informed that his/her managed care company has contracts with or will negotiate with certain facilities, including the following:        Yes   Patient/family informed of bed offers received.  Patient chooses bed at ([facility] )     Physician recommends and patient chooses bed at      Patient to be transferred to ([facility] ) on 11/23/18.  Patient to be transferred to facility by (Patient's sister [name] will transport. )     Patient family notified on 11/23/18 of transfer.  Name of family member notified:  (Patient's sister [name] is aware of D/C today. )     PHYSICIAN       Additional  Comment:    _______________________________________________ , Veronia Beets, LCSW 11/23/2018, 11:33 AM

## 2018-11-23 NOTE — Progress Notes (Signed)
Called report to [place] at [facility]. Family member coming to transport resident to facility.  Bethann Punches, RN

## 2018-11-23 NOTE — Progress Notes (Signed)
VSS. PIV removed. Family member arrived to transport pt to [facility]. NT escorted pt to car via wc.  Bethann Punches, RN
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
## 2018-11-23 NOTE — Progress Notes (Signed)
[name] medically stable for D/C to [facility] today. [employer] approved SNF after MD completed peer to peer yesterday. Per Otila Kluver [facility] liaison patient can come to [facility] today to room 713. RN will call report. Patient's sister [name] has agreed to transport patient. Clinical Education officer, museum (CSW) sent D/C orders to [facility] via [facility]. [name] aware of above. Patient's sister [name] is aware of above. Please reconsult if future social work needs arise. CSW signing off.   [employer], LCSW [phone]

## 2018-12-22 DIAGNOSIS — M810 Age-related osteoporosis without current pathological fracture: Secondary | ICD-10-CM | POA: Insufficient documentation

## 2019-01-11 DIAGNOSIS — E559 Vitamin D deficiency, unspecified: Secondary | ICD-10-CM | POA: Insufficient documentation

## 2019-03-19 ENCOUNTER — Other Ambulatory Visit: Payer: Self-pay

## 2019-03-19 ENCOUNTER — Encounter: Payer: Self-pay | Admitting: Emergency Medicine

## 2019-03-19 ENCOUNTER — Emergency Department: Payer: Medicare HMO

## 2019-03-19 ENCOUNTER — Inpatient Hospital Stay
Admission: EM | Admit: 2019-03-19 | Discharge: 2019-04-01 | DRG: 896 | Disposition: A | Discharge status: Skilled Nursing Facility | Payer: Medicare HMO | Attending: Internal Medicine | Admitting: Internal Medicine

## 2019-03-19 DIAGNOSIS — F10239 Alcohol dependence with withdrawal, unspecified: Secondary | ICD-10-CM | POA: Diagnosis present

## 2019-03-19 DIAGNOSIS — F10230 Alcohol dependence with withdrawal, uncomplicated: Secondary | ICD-10-CM | POA: Diagnosis not present

## 2019-03-19 DIAGNOSIS — W19XXXA Unspecified fall, initial encounter: Secondary | ICD-10-CM | POA: Diagnosis present

## 2019-03-19 DIAGNOSIS — E876 Hypokalemia: Secondary | ICD-10-CM | POA: Diagnosis present

## 2019-03-19 DIAGNOSIS — I4891 Unspecified atrial fibrillation: Secondary | ICD-10-CM

## 2019-03-19 DIAGNOSIS — S42201A Unspecified fracture of upper end of right humerus, initial encounter for closed fracture: Secondary | ICD-10-CM | POA: Insufficient documentation

## 2019-03-19 DIAGNOSIS — Z87891 Personal history of nicotine dependence: Secondary | ICD-10-CM

## 2019-03-19 DIAGNOSIS — K2920 Alcoholic gastritis without bleeding: Secondary | ICD-10-CM | POA: Diagnosis present

## 2019-03-19 DIAGNOSIS — S42294A Other nondisplaced fracture of upper end of right humerus, initial encounter for closed fracture: Secondary | ICD-10-CM | POA: Diagnosis present

## 2019-03-19 DIAGNOSIS — F10939 Alcohol use, unspecified with withdrawal, unspecified: Secondary | ICD-10-CM | POA: Diagnosis present

## 2019-03-19 DIAGNOSIS — Z66 Do not resuscitate: Secondary | ICD-10-CM | POA: Diagnosis not present

## 2019-03-19 DIAGNOSIS — I10 Essential (primary) hypertension: Secondary | ICD-10-CM | POA: Diagnosis present

## 2019-03-19 DIAGNOSIS — I480 Paroxysmal atrial fibrillation: Secondary | ICD-10-CM | POA: Diagnosis present

## 2019-03-19 DIAGNOSIS — F329 Major depressive disorder, single episode, unspecified: Secondary | ICD-10-CM | POA: Diagnosis present

## 2019-03-19 DIAGNOSIS — Z7189 Other specified counseling: Secondary | ICD-10-CM

## 2019-03-19 DIAGNOSIS — Z1159 Encounter for screening for other viral diseases: Secondary | ICD-10-CM

## 2019-03-19 DIAGNOSIS — G8111 Spastic hemiplegia affecting right dominant side: Secondary | ICD-10-CM | POA: Diagnosis present

## 2019-03-19 DIAGNOSIS — F10930 Alcohol use, unspecified with withdrawal, uncomplicated: Secondary | ICD-10-CM

## 2019-03-19 DIAGNOSIS — Z515 Encounter for palliative care: Secondary | ICD-10-CM

## 2019-03-19 DIAGNOSIS — Z79899 Other long term (current) drug therapy: Secondary | ICD-10-CM

## 2019-03-19 DIAGNOSIS — Z6820 Body mass index (BMI) 20.0-20.9, adult: Secondary | ICD-10-CM

## 2019-03-19 DIAGNOSIS — Z8249 Family history of ischemic heart disease and other diseases of the circulatory system: Secondary | ICD-10-CM

## 2019-03-19 DIAGNOSIS — E512 Wernicke's encephalopathy: Secondary | ICD-10-CM | POA: Diagnosis present

## 2019-03-19 DIAGNOSIS — E440 Moderate protein-calorie malnutrition: Secondary | ICD-10-CM

## 2019-03-19 DIAGNOSIS — Z7982 Long term (current) use of aspirin: Secondary | ICD-10-CM

## 2019-03-19 DIAGNOSIS — Z87820 Personal history of traumatic brain injury: Secondary | ICD-10-CM

## 2019-03-19 DIAGNOSIS — E871 Hypo-osmolality and hyponatremia: Secondary | ICD-10-CM | POA: Diagnosis present

## 2019-03-19 DIAGNOSIS — G4700 Insomnia, unspecified: Secondary | ICD-10-CM | POA: Diagnosis present

## 2019-03-19 DIAGNOSIS — E43 Unspecified severe protein-calorie malnutrition: Secondary | ICD-10-CM | POA: Diagnosis present

## 2019-03-19 DIAGNOSIS — D6959 Other secondary thrombocytopenia: Secondary | ICD-10-CM | POA: Diagnosis present

## 2019-03-19 DIAGNOSIS — Z801 Family history of malignant neoplasm of trachea, bronchus and lung: Secondary | ICD-10-CM

## 2019-03-19 DIAGNOSIS — R296 Repeated falls: Secondary | ICD-10-CM | POA: Diagnosis present

## 2019-03-19 LAB — CBC WITH DIFFERENTIAL/PLATELET
Abs Immature Granulocytes: 0.02 10*3/uL (ref 0.00–0.07)
Basophils Absolute: 0 10*3/uL (ref 0.0–0.1)
Basophils Relative: 1 %
Eosinophils Absolute: 0.1 10*3/uL (ref 0.0–0.5)
Eosinophils Relative: 1 %
HCT: 41.4 % (ref 39.0–52.0)
Hemoglobin: 14.8 g/dL (ref 13.0–17.0)
Immature Granulocytes: 0 %
Lymphocytes Relative: 65 %
Lymphs Abs: 4.7 10*3/uL — ABNORMAL HIGH (ref 0.7–4.0)
MCH: 31.4 pg (ref 26.0–34.0)
MCHC: 35.7 g/dL (ref 30.0–36.0)
MCV: 87.7 fL (ref 80.0–100.0)
Monocytes Absolute: 0.5 10*3/uL (ref 0.1–1.0)
Monocytes Relative: 7 %
Neutro Abs: 1.9 10*3/uL (ref 1.7–7.7)
Neutrophils Relative %: 26 %
Platelets: 82 10*3/uL — ABNORMAL LOW (ref 150–400)
RBC: 4.72 MIL/uL (ref 4.22–5.81)
RDW: 12 % (ref 11.5–15.5)
WBC: 7.2 10*3/uL (ref 4.0–10.5)
nRBC: 0 % (ref 0.0–0.2)

## 2019-03-19 LAB — COMPREHENSIVE METABOLIC PANEL
ALT: 69 U/L — ABNORMAL HIGH (ref 0–44)
AST: 138 U/L — ABNORMAL HIGH (ref 15–41)
Albumin: 4.2 g/dL (ref 3.5–5.0)
Alkaline Phosphatase: 170 U/L — ABNORMAL HIGH (ref 38–126)
Anion gap: 12 (ref 5–15)
BUN: <5 mg/dL — ABNORMAL LOW (ref 8–23)
CO2: 26 mmol/L (ref 22–32)
Calcium: 8.3 mg/dL — ABNORMAL LOW (ref 8.9–10.3)
Chloride: 96 mmol/L — ABNORMAL LOW (ref 98–111)
Creatinine, Ser: 0.44 mg/dL — ABNORMAL LOW (ref 0.61–1.24)
GFR calc Af Amer: >60 mL/min (ref >60)
GFR calc non Af Amer: >60 mL/min (ref >60)
Glucose, Bld: 92 mg/dL (ref 70–99)
Potassium: 3.4 mmol/L — ABNORMAL LOW (ref 3.5–5.1)
Sodium: 134 mmol/L — ABNORMAL LOW (ref 135–145)
Total Bilirubin: 1.1 mg/dL (ref 0.3–1.2)
Total Protein: 7.2 g/dL (ref 6.5–8.1)

## 2019-03-19 MED ORDER — MORPHINE SULFATE (PF) 4 MG/ML IV SOLN
4.0000 mg | INTRAVENOUS | Status: DC | PRN
  Administered 2019-03-19 – 2019-03-20 (×4): 4 mg via INTRAVENOUS
  Filled 2019-03-19 (×4): qty 1

## 2019-03-19 MED ORDER — ACETAMINOPHEN 500 MG PO TABS
1000.0000 mg | ORAL_TABLET | Freq: Once | ORAL | Status: AC
  Administered 2019-03-19: 1000 mg via ORAL
  Filled 2019-03-19: qty 2

## 2019-03-19 MED ORDER — HYDROCODONE-ACETAMINOPHEN 5-325 MG PO TABS: 1.0000 | ORAL_TABLET | ORAL | 0 refills | Status: DC | PRN

## 2019-03-19 MED ORDER — ONDANSETRON HCL 4 MG/2ML IJ SOLN
4.0000 mg | Freq: Once | INTRAMUSCULAR | Status: AC
  Administered 2019-03-19: 4 mg via INTRAVENOUS
  Filled 2019-03-19: qty 2

## 2019-03-19 MED ORDER — OXYCODONE HCL 5 MG PO TABS
5.0000 mg | ORAL_TABLET | Freq: Once | ORAL | Status: AC
  Administered 2019-03-19: 5 mg via ORAL
  Filled 2019-03-19: qty 1

## 2019-03-19 NOTE — ED Notes (Signed)
Pt in xray

## 2019-03-19 NOTE — ED Triage Notes (Addendum)
Pt fell. Has been drinking. Deformity to right shoulder. Radial pulse WNL. Pt is drinking daily.

## 2019-03-19 NOTE — ED Notes (Signed)
Pt requesting pain meds before xray, dr Quentin Cornwall notified.

## 2019-03-19 NOTE — ED Provider Notes (Addendum)
Labs with no acute findings.  [name] clinically sober.  Imaging with no acute findings.  Patient be discharged to the care of his sister with follow-up with orthopedics.  Arm was placed in a sling.  Discussed plan of follow-up with patient and standard return precautions   Rudene Re, MD 03/19/19 1909   _________________________ 9:34 PM on 03/19/2019 -----------------------------------------  Patient unable to contact any family member.  Does not feel safe going home by himself.  Will keep in in the emergency room until morning and reevaluate.  Will start [place] protocol   Rudene Re, MD 03/19/19 2135

## 2019-03-19 NOTE — ED Notes (Signed)
Patient transported to CT 

## 2019-03-19 NOTE — ED Notes (Signed)
Pt O2 sat down to 90%. Placed on nasal cannula 2L

## 2019-03-19 NOTE — ED Notes (Signed)
Pt placed in sling to support left shoulder. Pt stated that just placing his arm in the sling was excruciating and he didn't think he could go home. He states that he lives alone and there is no way he can do anything to take care of himself with this level of pain. He does not want to leave the hospital. He stated that his daughter has two small children and cant take care of him. Will notify provider.
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
## 2019-03-20 DIAGNOSIS — F10239 Alcohol dependence with withdrawal, unspecified: Secondary | ICD-10-CM | POA: Diagnosis present

## 2019-03-20 DIAGNOSIS — F10939 Alcohol use, unspecified with withdrawal, unspecified: Secondary | ICD-10-CM | POA: Diagnosis present

## 2019-03-20 LAB — MAGNESIUM: Magnesium: 1.9 mg/dL (ref 1.7–2.4)

## 2019-03-20 MED ORDER — LORAZEPAM 2 MG PO TABS: 0.0000 mg | ORAL_TABLET | Freq: Four times a day (QID) | ORAL | Status: DC

## 2019-03-20 MED ORDER — ONDANSETRON HCL 4 MG PO TABS
4.0000 mg | ORAL_TABLET | Freq: Four times a day (QID) | ORAL | Status: DC | PRN
  Administered 2019-03-28: 4 mg via ORAL
  Filled 2019-03-20: qty 1

## 2019-03-20 MED ORDER — THIAMINE HCL 100 MG/ML IJ SOLN: 100.0000 mg | Freq: Every day | INTRAMUSCULAR | Status: DC

## 2019-03-20 MED ORDER — PAROXETINE HCL 20 MG PO TABS
20.0000 mg | ORAL_TABLET | Freq: Every day | ORAL | Status: DC
  Administered 2019-03-21 – 2019-04-01 (×11): 20 mg via ORAL
  Filled 2019-03-20 (×12): qty 1

## 2019-03-20 MED ORDER — LORAZEPAM 2 MG/ML IJ SOLN
1.0000 mg | Freq: Four times a day (QID) | INTRAMUSCULAR | Status: DC | PRN
  Administered 2019-03-20 – 2019-03-23 (×6): 1 mg via INTRAVENOUS
  Filled 2019-03-20 (×6): qty 1

## 2019-03-20 MED ORDER — ONDANSETRON HCL 4 MG/2ML IJ SOLN
4.0000 mg | Freq: Four times a day (QID) | INTRAMUSCULAR | Status: DC | PRN
  Administered 2019-03-26 – 2019-03-27 (×2): 4 mg via INTRAVENOUS
  Filled 2019-03-20 (×2): qty 2

## 2019-03-20 MED ORDER — BACLOFEN 10 MG PO TABS
10.0000 mg | ORAL_TABLET | Freq: Three times a day (TID) | ORAL | Status: DC
  Administered 2019-03-20 – 2019-04-01 (×32): 10 mg via ORAL
  Filled 2019-03-20 (×39): qty 1

## 2019-03-20 MED ORDER — DILTIAZEM HCL ER COATED BEADS 120 MG PO CP24
120.0000 mg | ORAL_CAPSULE | Freq: Once | ORAL | Status: AC
  Administered 2019-03-20: 120 mg via ORAL
  Filled 2019-03-20: qty 1

## 2019-03-20 MED ORDER — SODIUM CHLORIDE 0.9 % IV SOLN
INTRAVENOUS | Status: DC
  Administered 2019-03-20 – 2019-03-24 (×7): via INTRAVENOUS

## 2019-03-20 MED ORDER — LORAZEPAM 2 MG/ML IJ SOLN
INTRAMUSCULAR | Status: AC
  Filled 2019-03-20: qty 1

## 2019-03-20 MED ORDER — LORAZEPAM 1 MG PO TABS
1.0000 mg | ORAL_TABLET | Freq: Four times a day (QID) | ORAL | Status: DC | PRN
  Administered 2019-03-22: 1 mg via ORAL
  Filled 2019-03-20: qty 1

## 2019-03-20 MED ORDER — DILTIAZEM HCL ER COATED BEADS 120 MG PO CP24
120.0000 mg | ORAL_CAPSULE | Freq: Two times a day (BID) | ORAL | Status: DC
  Administered 2019-03-20 – 2019-04-01 (×23): 120 mg via ORAL
  Filled 2019-03-20 (×23): qty 1

## 2019-03-20 MED ORDER — SODIUM CHLORIDE 0.9 % IV BOLUS
1000.0000 mL | Freq: Once | INTRAVENOUS | Status: AC
  Administered 2019-03-20: 1000 mL via INTRAVENOUS

## 2019-03-20 MED ORDER — LORAZEPAM 2 MG/ML IJ SOLN: 0.0000 mg | Freq: Two times a day (BID) | INTRAMUSCULAR | Status: DC

## 2019-03-20 MED ORDER — LORAZEPAM 2 MG/ML IJ SOLN
1.0000 mg | Freq: Once | INTRAMUSCULAR | Status: AC
  Administered 2019-03-20: 1 mg via INTRAVENOUS
  Filled 2019-03-20: qty 1

## 2019-03-20 MED ORDER — ACETAMINOPHEN 650 MG RE SUPP: 650.0000 mg | Freq: Four times a day (QID) | RECTAL | Status: DC | PRN

## 2019-03-20 MED ORDER — ONDANSETRON HCL 4 MG/2ML IJ SOLN
4.0000 mg | Freq: Once | INTRAMUSCULAR | Status: AC
  Administered 2019-03-20: 4 mg via INTRAVENOUS
  Filled 2019-03-20: qty 2

## 2019-03-20 MED ORDER — DILTIAZEM HCL 25 MG/5ML IV SOLN
5.0000 mg | Freq: Once | INTRAVENOUS | Status: AC
  Administered 2019-03-20: 5 mg via INTRAVENOUS
  Filled 2019-03-20: qty 5

## 2019-03-20 MED ORDER — ASPIRIN EC 81 MG PO TBEC
81.0000 mg | DELAYED_RELEASE_TABLET | Freq: Every day | ORAL | Status: DC
  Administered 2019-03-21 – 2019-04-01 (×10): 81 mg via ORAL
  Filled 2019-03-20 (×11): qty 1

## 2019-03-20 MED ORDER — LORAZEPAM 2 MG/ML IJ SOLN
0.0000 mg | Freq: Four times a day (QID) | INTRAMUSCULAR | Status: DC
  Administered 2019-03-20 (×2): 1 mg via INTRAVENOUS
  Filled 2019-03-20: qty 1

## 2019-03-20 MED ORDER — ADULT MULTIVITAMIN W/MINERALS CH: 1.0000 | ORAL_TABLET | Freq: Every day | ORAL | Status: DC

## 2019-03-20 MED ORDER — POLYETHYLENE GLYCOL 3350 17 G PO PACK
17.0000 g | PACK | Freq: Every day | ORAL | Status: DC | PRN
  Filled 2019-03-20: qty 1

## 2019-03-20 MED ORDER — ENOXAPARIN SODIUM 40 MG/0.4ML ~~LOC~~ SOLN
40.0000 mg | SUBCUTANEOUS | Status: DC
  Administered 2019-03-20: 40 mg via SUBCUTANEOUS
  Filled 2019-03-20: qty 0.4

## 2019-03-20 MED ORDER — OXYCODONE HCL 5 MG PO TABS
5.0000 mg | ORAL_TABLET | ORAL | Status: DC | PRN
  Administered 2019-03-20 – 2019-03-21 (×2): 5 mg via ORAL
  Filled 2019-03-20 (×2): qty 1

## 2019-03-20 MED ORDER — LORAZEPAM 2 MG PO TABS: 0.0000 mg | ORAL_TABLET | Freq: Two times a day (BID) | ORAL | Status: DC

## 2019-03-20 MED ORDER — VITAMIN C 500 MG PO TABS
250.0000 mg | ORAL_TABLET | Freq: Every day | ORAL | Status: DC
  Administered 2019-03-21 – 2019-04-01 (×11): 250 mg via ORAL
  Filled 2019-03-20 (×12): qty 1

## 2019-03-20 MED ORDER — METOPROLOL TARTRATE 5 MG/5ML IV SOLN: 5.0000 mg | Freq: Four times a day (QID) | INTRAVENOUS | Status: DC | PRN

## 2019-03-20 MED ORDER — ADULT MULTIVITAMIN W/MINERALS CH
1.0000 | ORAL_TABLET | Freq: Every day | ORAL | Status: DC
  Administered 2019-03-21 – 2019-04-01 (×11): 1 via ORAL
  Filled 2019-03-20 (×11): qty 1

## 2019-03-20 MED ORDER — ATENOLOL 25 MG PO TABS
25.0000 mg | ORAL_TABLET | Freq: Two times a day (BID) | ORAL | Status: DC
  Administered 2019-03-20 – 2019-03-30 (×20): 25 mg via ORAL
  Filled 2019-03-20 (×23): qty 1

## 2019-03-20 MED ORDER — VITAMIN B-1 100 MG PO TABS: 100.0000 mg | ORAL_TABLET | Freq: Every day | ORAL | Status: DC

## 2019-03-20 MED ORDER — VITAMIN B-1 100 MG PO TABS
100.0000 mg | ORAL_TABLET | Freq: Every day | ORAL | Status: DC
  Administered 2019-03-20 – 2019-03-21 (×2): 100 mg via ORAL
  Filled 2019-03-20 (×2): qty 1

## 2019-03-20 MED ORDER — ACETAMINOPHEN 325 MG PO TABS
650.0000 mg | ORAL_TABLET | Freq: Four times a day (QID) | ORAL | Status: DC | PRN
  Administered 2019-03-23: 21:00:00 650 mg via ORAL
  Filled 2019-03-20: qty 2

## 2019-03-20 MED ORDER — POTASSIUM CHLORIDE CRYS ER 20 MEQ PO TBCR
40.0000 meq | EXTENDED_RELEASE_TABLET | Freq: Once | ORAL | Status: AC
  Administered 2019-03-20: 40 meq via ORAL
  Filled 2019-03-20: qty 2

## 2019-03-20 MED ORDER — FOLIC ACID 1 MG PO TABS
1.0000 mg | ORAL_TABLET | Freq: Every day | ORAL | Status: DC
  Administered 2019-03-20 – 2019-04-01 (×12): 1 mg via ORAL
  Filled 2019-03-20 (×13): qty 1

## 2019-03-20 NOTE — ED Notes (Addendum)
Pt now resting comfortably. Unlabored, remains disoriented to time.

## 2019-03-20 NOTE — ED Notes (Signed)
Pt sleeping at this time. Waiting on social work consult. NAD. Unlabored. VSS

## 2019-03-20 NOTE — ED Provider Notes (Signed)
-----------------------------------------   12:48 PM on 03/20/2019 -----------------------------------------  I took over care of this patient from the prior physician.  The patient has a shoulder fracture and was being observed overnight since he did not feel safe going home during the middle of the night.  However the patient also drinks daily and there was concern for possible developing alcohol withdrawal.  He had intermittent tachycardia this morning.  I gave the patient his normal a.m. dose of p.o. Cardizem which initially did seem to improve his vital signs.  We placed the patient on the CIWA protocol.  However, his heart rate has intermittently gone up to the 130s and he appears tremulous with mild tongue fasciculation.  At this time, I do not think it is safe for the patient to go home as his presentation is consistent with alcohol withdrawal.  He will need medical admission for stabilization.  I ordered IV Ativan as well as an IV dose of Cardizem.  I signed the patient out to the hospitalist Dr. Brett Albino for admission.   Arta Silence, MD 03/20/19 1251

## 2019-03-20 NOTE — ED Notes (Signed)
Spoke with dr. Owens Shark regarding ordering home meds.

## 2019-03-20 NOTE — ED Notes (Signed)
Pt called 911 to report that his shoulder was in pain and the staff wasn't bring him pain medication fast enough. 911 operator contacted Financial controller. Pt told that calling 911 was inappropriate and not to do it again.

## 2019-03-20 NOTE — ED Notes (Signed)
Urine concentrated, pt with st in rate 140s on monitor, md notified, order for ativan and ns bolus received.

## 2019-03-20 NOTE — ED Notes (Signed)
Pt heart rate noted to be elevated to 149 ST on the monitor. Into the room to check on pt and pt is attempting to use the urinal. This RN assisted pt with using the urinal. HR now going back down. Will inform patients RN and continue to monitor.

## 2019-03-20 NOTE — Social Work (Signed)
Social work consult received.

## 2019-03-20 NOTE — Social Work (Addendum)
03/20/2019 11:20 AM SW met with attending and charge nurse Pt being admitted to the hospital due to medical issues.   03/20/2019 11:15 AM SW met with patient regarding paying for someone to come in to his home to help him.  Patient declined.  Discussed and assessed the patients readiness for change (SA).  [name] in the pre-contemplation stage of change. In this stage he already is well aware of the problem his alcohol use caused him, and is seriously considering change.  He stated that he could do this (stop drinking) at home on his own.   03/20/2019 10:04 AM  The patient is a 68 y.o., male who presented to [facility] ED on 03/19/2019 due to a fall.  Patient admitted that he had been drinking beer. The patient stated that he lives alone and is not able to take care of himself.  Stated that he has children but "they live far away."  Pt gave SW verbal consent to call his daughter and sister.   SW called Mrs. [name], daughter, [phone] in order to coordinate safe discharge home for this patient. [name] reported that she lives in [place], [place]. Stated that the patient has a 28 y.o., sister that lives nearby and that she checks on the patient. However, due to coronavirus and self-isolation she has not been able check-on him.   [name] noted that his sister called the county police and requested a well-check visit which reveal that the patient had not been eating for 1 week.  He receives meals-on-wheels which was left on the porch.  Patient did not pick-up his meal from the porch. He was locked in the house eating little to nothing and consuming alcohol.  [name] noted that her father has refused help. Does not want home health. Stated that "he could pay for someone to come in his home to help him, but he refuses.  Stated that she has been to [employer] and "they found him competent of making his own decision." Stated that he has been evaluated by psychiatrist and found him to be capable of making his own  decisions.  She noted that he continues to drink despite all the negative consequences. [name] stated that she warned her father that if he continues the same course, she would not come to his rescue. Noted that he "would have to find his way home this time." Patient has a 40 hx of alcohol use and abuse.   SW called patient's sister. She reported that she is 53 and has been calling the Sheriff to do a well-check visit on her brother "shorty" who is an alcoholic. Stated that "shorty retired in 2016 from [employer]. After he retired "it has been downhill since then. Requesting for pt to detox in the hospital.  Stated that she lost another brother due to alcohol abuse. Stated that her brother does have the financial means to pay for home health services or assisted living.    Plan: Pt to discharge home in a taxicab.   Berenice Bouton, MSW, LCSW  [phone] 8am-6pm (weekends) or CSW ED # [phone]   [facility] CM/SW signing off.
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
## 2019-03-20 NOTE — ED Notes (Signed)
Per MD give ativan at this time, wait for morphine administration.

## 2019-03-20 NOTE — ED Notes (Signed)
Report to valerie, rn.

## 2019-03-20 NOTE — Care Management Obs Status (Signed)
[place] NOTIFICATION   Patient Details  Name: Douglas Peterson. MRN: [id number] Date of Birth: [birth year]   Medicare Observation Status Notification Given:  Yes     A , RN 03/20/2019, 2:39 PM
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
## 2019-03-20 NOTE — Progress Notes (Signed)
Family Meeting Note  Advance Directive:no  Today a meeting took place with the Patient.  [name] able to participate.  The following clinical team members were present during this meeting:MD  The following were discussed:Patient's diagnosis: alcohol withdrawal, Patient's progosis: Unable to determine and Goals for treatment: Full Code   Patient states he is overall pretty healthy and he would like to be full code at this time.  Additional follow-up to be provided: prn  Time spent during discussion:20 minutes  Evette Doffing, MD

## 2019-03-20 NOTE — ED Notes (Signed)
Pt sleeping, still waiting on home meds to come from pharmacy.

## 2019-03-20 NOTE — Social Work (Addendum)
TOC CM/SW received a call from this patient's sister.  Updated sister of patient's status in hospital. See SW note in pt's chart.

## 2019-03-20 NOTE — ED Notes (Signed)
Pt pulled up in bed.  Breakfast tray set up for patient.

## 2019-03-20 NOTE — ED Notes (Signed)
Social work consult at bedside. pts HR elevated after being pulled up in bed.  Dr Cherylann Banas notified. Pt has not had home meds ordered.

## 2019-03-21 DIAGNOSIS — G8111 Spastic hemiplegia affecting right dominant side: Secondary | ICD-10-CM | POA: Diagnosis present

## 2019-03-21 DIAGNOSIS — G4700 Insomnia, unspecified: Secondary | ICD-10-CM | POA: Diagnosis present

## 2019-03-21 DIAGNOSIS — E43 Unspecified severe protein-calorie malnutrition: Secondary | ICD-10-CM | POA: Diagnosis present

## 2019-03-21 DIAGNOSIS — Z66 Do not resuscitate: Secondary | ICD-10-CM | POA: Diagnosis not present

## 2019-03-21 DIAGNOSIS — Z87891 Personal history of nicotine dependence: Secondary | ICD-10-CM | POA: Diagnosis not present

## 2019-03-21 DIAGNOSIS — Z515 Encounter for palliative care: Secondary | ICD-10-CM | POA: Diagnosis not present

## 2019-03-21 DIAGNOSIS — R296 Repeated falls: Secondary | ICD-10-CM | POA: Diagnosis present

## 2019-03-21 DIAGNOSIS — E871 Hypo-osmolality and hyponatremia: Secondary | ICD-10-CM | POA: Diagnosis present

## 2019-03-21 DIAGNOSIS — E512 Wernicke's encephalopathy: Secondary | ICD-10-CM | POA: Diagnosis present

## 2019-03-21 DIAGNOSIS — Z6820 Body mass index (BMI) 20.0-20.9, adult: Secondary | ICD-10-CM | POA: Diagnosis not present

## 2019-03-21 DIAGNOSIS — D6959 Other secondary thrombocytopenia: Secondary | ICD-10-CM | POA: Diagnosis present

## 2019-03-21 DIAGNOSIS — K2920 Alcoholic gastritis without bleeding: Secondary | ICD-10-CM | POA: Diagnosis present

## 2019-03-21 DIAGNOSIS — F329 Major depressive disorder, single episode, unspecified: Secondary | ICD-10-CM | POA: Diagnosis present

## 2019-03-21 DIAGNOSIS — I480 Paroxysmal atrial fibrillation: Secondary | ICD-10-CM | POA: Diagnosis present

## 2019-03-21 DIAGNOSIS — Z79899 Other long term (current) drug therapy: Secondary | ICD-10-CM | POA: Diagnosis not present

## 2019-03-21 DIAGNOSIS — S42294A Other nondisplaced fracture of upper end of right humerus, initial encounter for closed fracture: Secondary | ICD-10-CM | POA: Diagnosis present

## 2019-03-21 DIAGNOSIS — I10 Essential (primary) hypertension: Secondary | ICD-10-CM | POA: Diagnosis present

## 2019-03-21 DIAGNOSIS — E876 Hypokalemia: Secondary | ICD-10-CM | POA: Diagnosis present

## 2019-03-21 DIAGNOSIS — Z1159 Encounter for screening for other viral diseases: Secondary | ICD-10-CM | POA: Diagnosis not present

## 2019-03-21 DIAGNOSIS — W19XXXA Unspecified fall, initial encounter: Secondary | ICD-10-CM | POA: Diagnosis present

## 2019-03-21 DIAGNOSIS — Z7982 Long term (current) use of aspirin: Secondary | ICD-10-CM | POA: Diagnosis not present

## 2019-03-21 DIAGNOSIS — F10230 Alcohol dependence with withdrawal, uncomplicated: Secondary | ICD-10-CM | POA: Diagnosis present

## 2019-03-21 DIAGNOSIS — Z7189 Other specified counseling: Secondary | ICD-10-CM | POA: Diagnosis not present

## 2019-03-21 DIAGNOSIS — Z87820 Personal history of traumatic brain injury: Secondary | ICD-10-CM | POA: Diagnosis not present

## 2019-03-21 DIAGNOSIS — Z8249 Family history of ischemic heart disease and other diseases of the circulatory system: Secondary | ICD-10-CM | POA: Diagnosis not present

## 2019-03-21 DIAGNOSIS — Z801 Family history of malignant neoplasm of trachea, bronchus and lung: Secondary | ICD-10-CM | POA: Diagnosis not present

## 2019-03-21 DIAGNOSIS — I4891 Unspecified atrial fibrillation: Secondary | ICD-10-CM | POA: Diagnosis present

## 2019-03-21 LAB — BASIC METABOLIC PANEL
Anion gap: 7 (ref 5–15)
BUN: 8 mg/dL (ref 8–23)
CO2: 26 mmol/L (ref 22–32)
Calcium: 8.2 mg/dL — ABNORMAL LOW (ref 8.9–10.3)
Chloride: 106 mmol/L (ref 98–111)
Creatinine, Ser: 0.46 mg/dL — ABNORMAL LOW (ref 0.61–1.24)
GFR calc Af Amer: >60 mL/min (ref >60)
GFR calc non Af Amer: >60 mL/min (ref >60)
Glucose, Bld: 106 mg/dL — ABNORMAL HIGH (ref 70–99)
Potassium: 3.9 mmol/L (ref 3.5–5.1)
Sodium: 139 mmol/L (ref 135–145)

## 2019-03-21 LAB — CBC
HCT: 29.6 % — ABNORMAL LOW (ref 39.0–52.0)
Hemoglobin: 10 g/dL — ABNORMAL LOW (ref 13.0–17.0)
MCH: 31.3 pg (ref 26.0–34.0)
MCHC: 33.8 g/dL (ref 30.0–36.0)
MCV: 92.5 fL (ref 80.0–100.0)
Platelets: 58 10*3/uL — ABNORMAL LOW (ref 150–400)
RBC: 3.2 MIL/uL — ABNORMAL LOW (ref 4.22–5.81)
RDW: 12.5 % (ref 11.5–15.5)
WBC: 4 10*3/uL (ref 4.0–10.5)
nRBC: 0 % (ref 0.0–0.2)

## 2019-03-21 LAB — TSH: TSH: 1.52 u[IU]/mL (ref 0.350–4.500)

## 2019-03-21 MED ORDER — THIAMINE HCL 100 MG/ML IJ SOLN
250.0000 mg | Freq: Every day | INTRAVENOUS | Status: DC
  Administered 2019-03-24 – 2019-03-27 (×4): 250 mg via INTRAVENOUS
  Filled 2019-03-21 (×5): qty 2.5

## 2019-03-21 MED ORDER — OXYCODONE-ACETAMINOPHEN 7.5-325 MG PO TABS
1.0000 | ORAL_TABLET | Freq: Four times a day (QID) | ORAL | Status: DC | PRN
  Administered 2019-03-21 – 2019-03-22 (×4): 1 via ORAL
  Administered 2019-03-22 – 2019-03-24 (×3): 2 via ORAL
  Administered 2019-03-24 (×2): 1 via ORAL
  Administered 2019-03-24 – 2019-03-29 (×7): 2 via ORAL
  Administered 2019-03-29 – 2019-03-30 (×4): 1 via ORAL
  Administered 2019-03-31 – 2019-04-01 (×3): 2 via ORAL
  Filled 2019-03-21 (×3): qty 1
  Filled 2019-03-21 (×2): qty 2
  Filled 2019-03-21 (×2): qty 1
  Filled 2019-03-21: qty 2
  Filled 2019-03-21 (×2): qty 1
  Filled 2019-03-21 (×3): qty 2
  Filled 2019-03-21: qty 1
  Filled 2019-03-21: qty 2
  Filled 2019-03-21 (×2): qty 1
  Filled 2019-03-21 (×5): qty 2
  Filled 2019-03-21: qty 1
  Filled 2019-03-21 (×2): qty 2

## 2019-03-21 MED ORDER — VITAMIN B-1 100 MG PO TABS
100.0000 mg | ORAL_TABLET | Freq: Every day | ORAL | Status: DC
  Administered 2019-03-29 – 2019-04-01 (×4): 100 mg via ORAL
  Filled 2019-03-21 (×4): qty 1

## 2019-03-21 MED ORDER — THIAMINE HCL 100 MG/ML IJ SOLN: 100.0000 mg | Freq: Every day | INTRAMUSCULAR | Status: DC

## 2019-03-21 MED ORDER — THIAMINE HCL 100 MG/ML IJ SOLN
500.0000 mg | Freq: Three times a day (TID) | INTRAVENOUS | Status: AC
  Administered 2019-03-21 – 2019-03-22 (×5): 500 mg via INTRAVENOUS
  Filled 2019-03-21 (×7): qty 5

## 2019-03-21 NOTE — Progress Notes (Addendum)
Initial Nutrition Assessment  DOCUMENTATION CODES:   Not applicable  INTERVENTION:   Magic cup TID with meals, each supplement provides 290 kcal and 9 grams of protein  MVI and folic acid daily in setting of etoh abuse.   Recommend 500 mg of thiamine intravenously, infused over 30 minutes, three times daily for two consecutive days and 250 mg intravenously once daily for an additional five days  Recommend monitor K, Mg, and P labs as pt likely at high refeeding risk.     NUTRITION DIAGNOSIS:   Predicted suboptimal nutrient intake related to social / environmental circumstances(etoh abuse ) as evidenced by other (comment)(per chart review ).  GOAL:   Patient will meet greater than or equal to 90% of their needs  MONITOR:   PO intake, Supplement acceptance, Labs, Weight trends, Skin, I & O's  REASON FOR ASSESSMENT:   Malnutrition Screening Tool    ASSESSMENT:   68 y.o. male with a known history of hypertension, alcohol abuse who presented to the ED with right shoulder pain after a fall.  Right shoulder x-ray showed a fracture in the proximal humerus.    RD working remotely.  Patient familiar to nutrition department and this RD from multiple previous admits. Pt last seen by this RD on 03/11/2018. Pt with poor appetite and oral intake at baseline r/t chronic etoh abuse. Pt with h/o multiple falls. Pt at high risk for wernicke's, would recommend high dose IV thiamine. Pt seen by SLP today and placed on a dysphagia 1/honey thick diet. RD will add Magic Cups to meal trays. Per chart, pt appears fairly weight stable over the past year; pt reports his UBW is ~120lbs.   Pt previously diagnosed with moderate malnutrition. Pt at high risk for developing severe malnutrition but unable to diagnose at this time as NFPE cannot be performed.   Medications reviewed and include: aspirin, lovenox, folic acid, MVI, paxil, thiamine, vitamin C, NaCl @75ml /hr  Labs reviewed: creat 0.46(L), Mg  1.9 wnl Hgb 10.0(L), Hct 29.6(L)  Unable to complete Nutrition-Focused physical exam at this time.   Diet Order:   Diet Order            Diet NPO time specified  Diet effective now             EDUCATION NEEDS:   No education needs have been identified at this time  Skin:  Skin Assessment: Reviewed RN Assessment  Last BM:  pta  Height:   Ht Readings from Last 1 Encounters:  03/20/19 5' 2"  (1.575 m)    Weight:   Wt Readings from Last 1 Encounters:  03/20/19 50.6 kg    Ideal Body Weight:  53.6 kg  BMI:  Body mass index is 20.39 kg/m.  Estimated Nutritional Needs:   Kcal:  1500-1700kcal/day   Protein:  75-85g/day   Fluid:  >1.3L/day   Koleen Distance MS, RD, LDN Pager #- [phone] Office#- [phone] After Hours Pager: [phone]

## 2019-03-21 NOTE — Progress Notes (Addendum)
[facility] [place] NAME: Douglas Peterson    MR#:  [id number]  DATE OF BIRTH:  [birth year]  SUBJECTIVE:  CHIEF COMPLAINT:   Chief Complaint  Patient presents with  . Shoulder Injury  Patient seen and evaluated today Has periods of agitation On CIWA protocol Has sling to right arm Arousable to loud verbal stimuli Drifts back to sleep  REVIEW OF SYSTEMS:    ROS  Could not be obtained as patient is still lethargic  DRUG ALLERGIES:  No Known Allergies  VITALS:  Blood pressure 115/72, pulse 82, temperature 97.9 F (36.6 C), temperature source Oral, resp. rate 12, height 5' 2"  (1.575 m), weight 50.6 kg, SpO2 94 %.  PHYSICAL EXAMINATION:   Physical Exam  GENERAL:  68 y.o.-year-old patient lying in the bed  EYES: Pupils equal, round, reactive to light and accommodation. No scleral icterus. Extraocular muscles intact.  HEENT: Head atraumatic, normocephalic. Oropharynx and nasopharynx clear.  NECK:  Supple, no jugular venous distention. No thyroid enlargement, no tenderness.  LUNGS: Normal breath sounds bilaterally, no wheezing, rales, rhonchi. No use of accessory muscles of respiration.  CARDIOVASCULAR: S1, S2 normal. No murmurs, rubs, or gallops.  ABDOMEN: Soft, nontender, nondistended. Bowel sounds present. No organomegaly or mass.  EXTREMITIES: No cyanosis, clubbing or edema b/l.    To right upper extremity NEUROLOGIC: Arousable Not completely oriented to time place and person Moves all extremities PSYCHIATRIC: The patient is alert and oriented x 1.  SKIN: No obvious rash, lesion, or ulcer.   LABORATORY PANEL:   CBC Recent Labs  Lab 03/21/19 0452  WBC 4.0  HGB 10.0*  HCT 29.6*  PLT 58*   ------------------------------------------------------------------------------------------------------------------ Chemistries  Recent Labs  Lab 03/19/19 1659 03/20/19 1555 03/21/19 0452  NA 134*  --  139  K 3.4*  --  3.9  CL 96*   --  106  CO2 26  --  26  GLUCOSE 92  --  106*  BUN <5*  --  8  CREATININE 0.44*  --  0.46*  CALCIUM 8.3*  --  8.2*  MG  --  1.9  --   AST 138*  --   --   ALT 69*  --   --   ALKPHOS 170*  --   --   BILITOT 1.1  --   --    ------------------------------------------------------------------------------------------------------------------  Cardiac Enzymes No results for input(s): TROPONINI in the last 168 hours. ------------------------------------------------------------------------------------------------------------------  RADIOLOGY:  Dg Shoulder Right  Result Date: 03/19/2019 CLINICAL DATA:  Pain after fall EXAM: RIGHT SHOULDER - 2+ VIEW COMPARISON:  None. FINDINGS: There is a fracture in the proximal humerus extending through the head and neck, into the proximal diaphysis. No dislocation. IMPRESSION: Fracture in the proximal humerus involving the head, neck, and proximal diaphysis. Electronically Signed   By: Dorise Bullion III M.D   On: 03/19/2019 14:48   Ct Head Wo Contrast  Result Date: 03/19/2019 CLINICAL DATA:  Status post fall EXAM: CT HEAD WITHOUT CONTRAST CT CERVICAL SPINE WITHOUT CONTRAST TECHNIQUE: Multidetector CT imaging of the head and cervical spine was performed following the standard protocol without intravenous contrast. Multiplanar CT image reconstructions of the cervical spine were also generated. COMPARISON:  10/12/2018 FINDINGS: CT HEAD FINDINGS Brain: No evidence of acute infarction, hemorrhage, hydrocephalus, extra-axial collection or mass lesion/mass effect. There is mild diffuse low-attenuation within the subcortical and periventricular white matter compatible with chronic microvascular disease. Prominence of the sulci and ventricles identified compatible with  brain atrophy. Vascular: No hyperdense vessel or unexpected calcification. Skull: Normal. Negative for fracture or focal lesion. Sinuses/Orbits: No acute finding. Other: None CT CERVICAL SPINE FINDINGS  Alignment: Normal. Skull base and vertebrae: No acute fracture. No primary bone lesion or focal pathologic process. Soft tissues and spinal canal: No prevertebral fluid or swelling. No visible canal hematoma. Disc levels: Multi level disc space narrowing and endplate spurring is identified. This appears most advanced at C5-6 and C6-7. Upper chest: Negative. Other: None IMPRESSION: 1. No acute intracranial abnormalities. 2. Chronic small vessel ischemic disease and brain atrophy. 3. No evidence for cervical spine fracture 4. Cervical spondylosis. Electronically Signed   By: Kerby Moors M.D.   On: 03/19/2019 16:11   Ct Cervical Spine Wo Contrast  Result Date: 03/19/2019 CLINICAL DATA:  Status post fall EXAM: CT HEAD WITHOUT CONTRAST CT CERVICAL SPINE WITHOUT CONTRAST TECHNIQUE: Multidetector CT imaging of the head and cervical spine was performed following the standard protocol without intravenous contrast. Multiplanar CT image reconstructions of the cervical spine were also generated. COMPARISON:  10/12/2018 FINDINGS: CT HEAD FINDINGS Brain: No evidence of acute infarction, hemorrhage, hydrocephalus, extra-axial collection or mass lesion/mass effect. There is mild diffuse low-attenuation within the subcortical and periventricular white matter compatible with chronic microvascular disease. Prominence of the sulci and ventricles identified compatible with brain atrophy. Vascular: No hyperdense vessel or unexpected calcification. Skull: Normal. Negative for fracture or focal lesion. Sinuses/Orbits: No acute finding. Other: None CT CERVICAL SPINE FINDINGS Alignment: Normal. Skull base and vertebrae: No acute fracture. No primary bone lesion or focal pathologic process. Soft tissues and spinal canal: No prevertebral fluid or swelling. No visible canal hematoma. Disc levels: Multi level disc space narrowing and endplate spurring is identified. This appears most advanced at C5-6 and C6-7. Upper chest: Negative.  Other: None IMPRESSION: 1. No acute intracranial abnormalities. 2. Chronic small vessel ischemic disease and brain atrophy. 3. No evidence for cervical spine fracture 4. Cervical spondylosis. Electronically Signed   By: Kerby Moors M.D.   On: 03/19/2019 16:11     ASSESSMENT AND PLAN:  68 year old male patient with history of alcohol abuse, hypertension, hyponatremia currently under hospitalist service for alcohol withdrawal  -Alcohol withdrawal Continue CIWA protocol Thiamine folic acid supplements Social worker follow-up  -Thrombocytopenia Secondary to alcohol use Avoid blood thinner medication such as Lovenox and heparin Monitor platelet count  -Right humerus fracture Sling immobilizer in place Orthopedic surgery consult  -Frequent falls secondary to alcohol use Imaging studies of head and spine no abnormality noted Physical therapy consult  -Acute hypokalemia Improved  -Hyponatremia secondary to beer potomania Improved  -Depression Continue Paxil  -History of paroxysmal atrial fibrillation Rate under control Continue atenolol and diltiazem Cardiac monitoring  -Wernicke's encephalopathy Secondary to chronic alcohol use IV thiamine supplementation   All the records are reviewed and case discussed with Care Management/Social Worker. Management plans discussed with the patient, family and they are in agreement.  CODE STATUS: Full code  DVT Prophylaxis: SCDs  TOTAL TIME TAKING CARE OF THIS PATIENT: 38 minutes.   POSSIBLE D/C IN 2 to 3 DAYS, DEPENDING ON CLINICAL CONDITION.  Saundra Shelling M.D on 03/21/2019 at 10:35 AM  Between 7am to 6pm - Pager - [phone]  After 6pm go to [email or web address] - password [email or web address] [facility]  Office  [phone]  CC: Primary care physician; [name], MD  Note: This dictation was prepared with Dragon dictation along with smaller phrase technology. Any transcriptional errors that result from this  process are unintentional.

## 2019-03-21 NOTE — Plan of Care (Signed)
Texted Dr. Estanislado Pandy with concern that pt needed to see ortho while inpt.  He's having significant pain and there's concern that he won't follow up outpt.  Dr. Estanislado Pandy increased pain medicine and agreed to order ortho consult.  Pt has hx of silent aspiration and was seen a year ago for alcohol issues and aspiration.  Pt worked with speech and swallow today and was put on Dysphagia I w/honey thick liquid.  Meds are to be crushed and put in honey thick liquid delivered by teaspoon.  Pt has had significant tremors which jars his shoulder.  Has been cooperative and not impulsive - up in chair.
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
## 2019-03-21 NOTE — Progress Notes (Signed)
PT Cancellation Note  Patient Details Name: Douglas Peterson. MRN: [id number] DOB: [birth year]   Cancelled Treatment:    Reason Eval/Treat Not Completed: Fatigue/lethargy limiting ability to participate Chart reviewed. Nursing consulted. Patient very lethargic at this time and not able to participate actively in therapy evaluation. PT will follow up at a later time/date.   Myles Gip PT, DPT [phone] 03/21/2019, 11:35 AM

## 2019-03-21 NOTE — Progress Notes (Signed)
PT Cancellation Note  Patient Details Name: Douglas Peterson. MRN: [id number] DOB: 1951-01-08   Cancelled Treatment:    Reason Eval/Treat Not Completed: Pain limiting ability to participate Chart reviewed. Nursing consulted. Patient sleeping in chair on PT arrival with RUE in sling. Patient awakens to loud auditory stimulus.  Patient reporting 10/10 pain in the RUE. Patient experienced considerable tremors which were causing spasm at the RUE and thereby increasing the patient's pain experience. Patient not oriented to context and asking if his arm is broken. Patient becoming somnolent after brief conversation. Evaluation deferred to a later time/date when patient better able to participate.  Myles Gip PT, DPT [phone] 03/21/2019, 3:46 PM

## 2019-03-21 NOTE — Plan of Care (Signed)
  Problem: Clinical Measurements: Goal: Will remain free from infection Outcome: Progressing Goal: Respiratory complications will improve Outcome: Progressing Goal: Cardiovascular complication will be avoided Outcome: Progressing   Problem: Safety: Goal: Ability to remain free from injury will improve Outcome: Progressing   Problem: Education: Goal: Knowledge of General Education information will improve Description Including pain rating scale, medication(s)/side effects and non-pharmacologic comfort measures Outcome: Not Progressing   Problem: Activity: Goal: Risk for activity intolerance will decrease Outcome: Not Progressing   Problem: Pain Managment: Goal: General experience of comfort will improve Outcome: Not Progressing
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
## 2019-03-22 LAB — BASIC METABOLIC PANEL
Anion gap: 5 (ref 5–15)
BUN: 7 mg/dL — ABNORMAL LOW (ref 8–23)
CO2: 28 mmol/L (ref 22–32)
Calcium: 8.3 mg/dL — ABNORMAL LOW (ref 8.9–10.3)
Chloride: 107 mmol/L (ref 98–111)
Creatinine, Ser: 0.31 mg/dL — ABNORMAL LOW (ref 0.61–1.24)
GFR calc Af Amer: >60 mL/min (ref >60)
GFR calc non Af Amer: >60 mL/min (ref >60)
Glucose, Bld: 118 mg/dL — ABNORMAL HIGH (ref 70–99)
Potassium: 3.3 mmol/L — ABNORMAL LOW (ref 3.5–5.1)
Sodium: 140 mmol/L (ref 135–145)

## 2019-03-22 MED ORDER — LORAZEPAM 2 MG/ML IJ SOLN
1.0000 mg | Freq: Once | INTRAMUSCULAR | Status: AC
  Administered 2019-03-23: 1 mg via INTRAMUSCULAR
  Filled 2019-03-22: qty 1

## 2019-03-22 MED ORDER — POTASSIUM CHLORIDE 10 MEQ/100ML IV SOLN
10.0000 meq | INTRAVENOUS | Status: AC
  Administered 2019-03-22 (×2): 10 meq via INTRAVENOUS
  Filled 2019-03-22 (×2): qty 100
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
## 2019-03-22 NOTE — Progress Notes (Signed)
PT Cancellation Note  Patient Details Name: Douglas Peterson. MRN: [id number] DOB: Jul 29, 1951   Cancelled Treatment:    Reason Eval/Treat Not Completed: Other (comment);Fatigue/lethargy limiting ability to participate(Patient sleeping. Unable to rouse with sternal rub. Will attempt again at later time/date. )   Janna Arch, PT, DPT   03/22/2019, 3:19 PM

## 2019-03-22 NOTE — Progress Notes (Signed)
  Speech Language Pathology Treatment: Dysphagia  Patient Details Name: Douglas Peterson. MRN: [id number] DOB: [birth year] Today's Date: 03/22/2019 Time: 1552-0802 SLP Time Calculation (min) (ACUTE ONLY): 40 min  Assessment / Plan / Recommendation Clinical Impression  Pt seen for ongoing assessment of swallowing and toleration of recommended Dysphagia diet yesterday. Pt has a h/o Dysphagia: MBSS on 03/11/2018 revealed SILENT ASPIRATION of nectar thick liquids by TSP, swallow delay, poor laryngeal excursion w/poor epiglottic inversion for airway protection, moderate pharyngeal residue which was largely not decreased with f/u swallows, and slow movement thru the upper esophagus. Recommended to on a Dysphagia level 1 (puree) w/ Honey consistency liquids w/ aspiration precautions.  Pt presented w/ (apparent) Apnea moments lasting ~15-20 seconds upon entering room; NSG/CM updated. Pt was positioned upright in bed more and given verbal/tactile during session. He consumed trials of Honey liquids and purees via TSP w/ SLP today. No overt s/s of aspiration were noted during the trials; no decline in vocal quality or respiratory status noted during/post trials. He became more drowsy as session continued so further trials were held. Noted he easily drifted off to sleep w/out verbal/tactile stim by SLP. This presentation could easily increase risk for aspiration if pt is too drowsy to attend during po intake/meals. NSG reported good toleration of Pills in Puree today.  Recommend continue w/ current recommended dysphagia diet; aspiration precautions including Supervision w/ all oral intake. ST services will f/u to monitor pt's status and toleration of diet; education w/ pt/family as needed.    HPI HPI: Pt is a 68 y.o. male with a known history of Dysphagia, hypertension, alcohol abuse who presented to the ED with right shoulder pain after a fall.  Right shoulder x-ray showed a fracture in the proximal humerus.  He was  supposed to be discharged home from the ED yesterday evening, but patient stated that he had nowhere to go and social work was working on Brewing technologist.  This morning, patient became shaky and began to develop tachycardia.  He was felt to be withdrawing from alcohol.  Patient denies any chest pain, shortness of breath, or palpitations.  He endorses continued right shoulder pain and states that he "cannot move his right shoulder".      SLP Plan  Continue with current plan of care       Recommendations  Diet recommendations: Dysphagia 1 (puree);Honey-thick liquid Liquids provided via: Teaspoon Medication Administration: Crushed with puree(as able/needed to) Supervision: Staff to assist with self feeding;Full supervision/cueing for compensatory strategies Compensations: Minimize environmental distractions;Slow rate;Small sips/bites;Lingual sweep for clearance of pocketing;Multiple dry swallows after each bite/sip;Follow solids with liquid Postural Changes and/or Swallow Maneuvers: Seated upright 90 degrees;Upright 30-60 min after meal                General recommendations: (Dietician f/u; Palliative care consult f/u) Oral Care Recommendations: Oral care BID;Staff/trained caregiver to provide oral care Follow up Recommendations: (TBD by SW) SLP Visit Diagnosis: Dysphagia, oropharyngeal phase (R13.12) Plan: Continue with current plan of care       [place], [place], [place] 03/22/2019, 4:29 PM

## 2019-03-22 NOTE — Progress Notes (Signed)
[name] confused and disoriented. Patient has tremors. Patient denies being at the hospital. [name] unaware of his environment. Patient has asked for me to call Dr.Kernodle so the physician can explain who the patient is. Patient denies drinking any alcohol in year. Patient refuses to keep sling in place. Patient believes the staff is trying to restraining with the sling. Explained to patient that he has a fracture. Will administer ativan per CIWA protocol.

## 2019-03-22 NOTE — Progress Notes (Signed)
[facility] [place] NAME: Douglas Peterson    MR#:  [id number]  DATE OF BIRTH:  08/17/1951  SUBJECTIVE:  CHIEF COMPLAINT:   Chief Complaint  Patient presents with  . Shoulder Injury  Patient seen and evaluated today Has periods of agitation On CIWA protocol Has sling to right arm Arousable to loud verbal stimuli Drifts back to sleep Has generalized weakness  REVIEW OF SYSTEMS:    ROS  Could not be obtained as patient is still lethargic  DRUG ALLERGIES:  No Known Allergies  VITALS:  Blood pressure 125/78, pulse 77, temperature 98 F (36.7 C), temperature source Oral, resp. rate 20, height 5' 2"  (1.575 m), weight 50.6 kg, SpO2 94 %.  PHYSICAL EXAMINATION:   Physical Exam  GENERAL:  68 y.o.-year-old patient lying in the bed  EYES: Pupils equal, round, reactive to light and accommodation. No scleral icterus. Extraocular muscles intact.  HEENT: Head atraumatic, normocephalic. Oropharynx and nasopharynx clear.  NECK:  Supple, no jugular venous distention. No thyroid enlargement, no tenderness.  LUNGS: Normal breath sounds bilaterally, no wheezing, rales, rhonchi. No use of accessory muscles of respiration.  CARDIOVASCULAR: S1, S2 normal. No murmurs, rubs, or gallops.  ABDOMEN: Soft, nontender, nondistended. Bowel sounds present. No organomegaly or mass.  EXTREMITIES: No cyanosis, clubbing or edema b/l.    Sling To right upper extremity NEUROLOGIC: Arousable Not completely oriented to time place and person Moves all extremities PSYCHIATRIC: The patient is alert and oriented x 1.  SKIN: No obvious rash, lesion, or ulcer.   LABORATORY PANEL:   CBC Recent Labs  Lab 03/21/19 0452  WBC 4.0  HGB 10.0*  HCT 29.6*  PLT 58*   ------------------------------------------------------------------------------------------------------------------ Chemistries  Recent Labs  Lab 03/19/19 1659 03/20/19 1555  03/22/19 0501  NA 134*  --     < > 140  K 3.4*  --    < > 3.3*  CL 96*  --    < > 107  CO2 26  --    < > 28  GLUCOSE 92  --    < > 118*  BUN <5*  --    < > 7*  CREATININE 0.44*  --    < > 0.31*  CALCIUM 8.3*  --    < > 8.3*  MG  --  1.9  --   --   AST 138*  --   --   --   ALT 69*  --   --   --   ALKPHOS 170*  --   --   --   BILITOT 1.1  --   --   --    < > = values in this interval not displayed.   ------------------------------------------------------------------------------------------------------------------  Cardiac Enzymes No results for input(s): TROPONINI in the last 168 hours. ------------------------------------------------------------------------------------------------------------------  RADIOLOGY:  No results found.   ASSESSMENT AND PLAN:  68 year old male patient with history of alcohol abuse, hypertension, hyponatremia currently under hospitalist service for alcohol withdrawal  -Alcohol withdrawal Continue CIWA protocol Thiamine folic acid supplements Social worker follow-up  -Thrombocytopenia Secondary to alcohol use Avoid blood thinner medication such as Lovenox and heparin Monitor platelet count  -Right humerus fracture Sling immobilizer in place Orthopedic surgery consult  -Frequent falls secondary to alcohol use Imaging studies of head and spine no abnormality noted Physical therapy consult  -Acute hypokalemia Improved  -Hyponatremia secondary to beer potomania Improved  -Depression Continue Paxil  -History of paroxysmal atrial fibrillation Rate under control  Continue atenolol and diltiazem Cardiac monitoring  -Wernicke's encephalopathy Secondary to chronic alcohol use IV thiamine supplementation  -Ambulatory dysfunction Physical therapy evaluation pending Patient weak and did not participate today   All the records are reviewed and case discussed with Care Management/Social Worker. Management plans discussed with the patient, family and they are in  agreement.  CODE STATUS: Full code  DVT Prophylaxis: SCDs  TOTAL TIME TAKING CARE OF THIS PATIENT: 36 minutes.   POSSIBLE D/C IN 2 to 3 DAYS, DEPENDING ON CLINICAL CONDITION.  Saundra Shelling M.D on 03/22/2019 at 11:19 AM  Between 7am to 6pm - Pager - [phone]  After 6pm go to [email or web address] - password [email or web address] [facility]  Office  [phone]  CC: Primary care physician; [name], MD  Note: This dictation was prepared with Dragon dictation along with smaller phrase technology. Any transcriptional errors that result from this process are unintentional.

## 2019-03-22 NOTE — Progress Notes (Signed)
PT Cancellation Note  Patient Details Name: Douglas Peterson. MRN: [id number] DOB: 1951/02/23   Cancelled Treatment:    Reason Eval/Treat Not Completed: Other (comment)(Patient screaming help upon PT entering room. Assist bringing head up a little, oriented patient to place and situation due to confusion. Started screaming help again. Nursing entered to give anxiety meds and pain meds. ) Will attempt again at later time/date once appropriate.   Janna Arch, PT, DPT   03/22/2019, 10:29 AM

## 2019-03-22 NOTE — Progress Notes (Signed)
Patient has rested most of the shift. Continuing to have pain and withdrawal symptoms that improve with PRN medications. Poor PO intake. Sleeping off and on most of the day. Tolerating PO meds crushed as able in applesauce.

## 2019-03-22 NOTE — Progress Notes (Signed)
PT Cancellation Note  Patient Details Name: Douglas Peterson. MRN: [id number] DOB: 04-12-1951   Cancelled Treatment:    Reason Eval/Treat Not Completed: Other (comment);Fatigue/lethargy limiting ability to participate(Patient sleeping with nursing in room. Will attempt again at later time/date. )  Janna Arch, PT, DPT   03/22/2019, 11:05 AM

## 2019-03-23 LAB — POTASSIUM: Potassium: 3.3 mmol/L — ABNORMAL LOW (ref 3.5–5.1)

## 2019-03-23 LAB — CBC
HCT: 28 % — ABNORMAL LOW (ref 39.0–52.0)
Hemoglobin: 9.4 g/dL — ABNORMAL LOW (ref 13.0–17.0)
MCH: 31.5 pg (ref 26.0–34.0)
MCHC: 33.6 g/dL (ref 30.0–36.0)
MCV: 94 fL (ref 80.0–100.0)
Platelets: 107 10*3/uL — ABNORMAL LOW (ref 150–400)
RBC: 2.98 MIL/uL — ABNORMAL LOW (ref 4.22–5.81)
RDW: 13.1 % (ref 11.5–15.5)
WBC: 5.4 10*3/uL (ref 4.0–10.5)
nRBC: 0 % (ref 0.0–0.2)

## 2019-03-23 LAB — GLUCOSE, CAPILLARY: Glucose-Capillary: 99 mg/dL (ref 70–99)

## 2019-03-23 MED ORDER — LORAZEPAM 2 MG/ML IJ SOLN
0.0000 mg | Freq: Four times a day (QID) | INTRAMUSCULAR | Status: AC
  Administered 2019-03-24: 1 mg via INTRAVENOUS
  Administered 2019-03-24: 05:00:00 2 mg via INTRAVENOUS
  Administered 2019-03-24: 1 mg via INTRAVENOUS
  Administered 2019-03-25: 02:00:00 2 mg via INTRAVENOUS
  Filled 2019-03-23 (×5): qty 1

## 2019-03-23 MED ORDER — CHLORDIAZEPOXIDE HCL 25 MG PO CAPS: 25.0000 mg | ORAL_CAPSULE | Freq: Four times a day (QID) | ORAL | Status: DC

## 2019-03-23 MED ORDER — CHLORDIAZEPOXIDE HCL 25 MG PO CAPS
25.0000 mg | ORAL_CAPSULE | Freq: Three times a day (TID) | ORAL | Status: AC
  Administered 2019-03-23 – 2019-03-25 (×9): 25 mg via ORAL
  Filled 2019-03-23 (×9): qty 1

## 2019-03-23 MED ORDER — LORAZEPAM 2 MG/ML IJ SOLN
1.0000 mg | Freq: Four times a day (QID) | INTRAMUSCULAR | Status: AC | PRN
  Administered 2019-03-25: 17:00:00 1 mg via INTRAVENOUS
  Filled 2019-03-23: qty 1

## 2019-03-23 MED ORDER — LORAZEPAM 2 MG/ML IJ SOLN
0.0000 mg | Freq: Two times a day (BID) | INTRAMUSCULAR | Status: AC
  Administered 2019-03-26: 1 mg via INTRAVENOUS
  Administered 2019-03-26: 2 mg via INTRAVENOUS
  Filled 2019-03-23 (×2): qty 1

## 2019-03-23 MED ORDER — LORAZEPAM 1 MG PO TABS
1.0000 mg | ORAL_TABLET | Freq: Four times a day (QID) | ORAL | Status: AC | PRN
  Filled 2019-03-23: qty 1

## 2019-03-23 MED ORDER — POTASSIUM CHLORIDE CRYS ER 20 MEQ PO TBCR
40.0000 meq | EXTENDED_RELEASE_TABLET | Freq: Once | ORAL | Status: AC
  Administered 2019-03-23: 40 meq via ORAL
  Filled 2019-03-23: qty 2

## 2019-03-23 NOTE — Progress Notes (Signed)
Nurse in room giving pt meds

## 2019-03-23 NOTE — Progress Notes (Signed)
Pt increasingly confused, still talking to people not present, also asked if he was driving and said he would be back for his things later

## 2019-03-23 NOTE — Progress Notes (Signed)
Pt stated he does not want his breakfast

## 2019-03-23 NOTE — Plan of Care (Signed)
  Problem: Education: Goal: Knowledge of disease or condition will improve Outcome: Not Progressing Goal: Understanding of discharge needs will improve Outcome: Not Progressing   Problem: Health Behavior/Discharge Planning: Goal: Ability to identify changes in lifestyle to reduce recurrence of condition will improve Outcome: Not Progressing   Problem: Physical Regulation: Goal: Complications related to the disease process, condition or treatment will be avoided or minimized Outcome: Not Progressing   Problem: Safety: Goal: Ability to remain free from injury will improve Outcome: Not Progressing Note:  [name] impulsive, unable to follow directions and confused.

## 2019-03-23 NOTE — Progress Notes (Signed)
PT Cancellation Note  Patient Details Name: Douglas Peterson. MRN: [id number] DOB: Apr 15, 1951   Cancelled Treatment:    Reason Eval/Treat Not Completed: Other (comment);Fatigue/lethargy limiting ability to participate(Hold per nursing. Patient now has sitter and recieved ativan this am. Not able to participate in therapy at this time. Will follow patient closely and attempt again at later time/date when patient appropriate. )  Janna Arch, PT, DPT   03/23/2019, 9:13 AM

## 2019-03-23 NOTE — Progress Notes (Signed)
[place] at [place] NAME: Douglas Peterson    MR#:  [id number]  DATE OF BIRTH:  09/26/1951  SUBJECTIVE:  CHIEF COMPLAINT:   Chief Complaint  Patient presents with  . Shoulder Injury  Patient seen and evaluated today Patient actively withdrawing Has tremors of the upper extremities Agitated On CIWA protocol Has sling to right arm   REVIEW OF SYSTEMS:    ROS  Could not be obtained as patient is in withdrawal  DRUG ALLERGIES:  No Known Allergies  VITALS:  Blood pressure (!) 144/79, pulse 74, temperature 98.2 F (36.8 C), temperature source Oral, resp. rate 18, height 5' 2"  (1.575 m), weight 50.6 kg, SpO2 97 %.  PHYSICAL EXAMINATION:   Physical Exam  GENERAL:  68 y.o.-year-old patient lying in the bed  EYES: Pupils equal, round, reactive to light and accommodation. No scleral icterus. Extraocular muscles intact.  HEENT: Head atraumatic, normocephalic. Oropharynx and nasopharynx clear.  NECK:  Supple, no jugular venous distention. No thyroid enlargement, no tenderness.  LUNGS: Normal breath sounds bilaterally, no wheezing, rales, rhonchi. No use of accessory muscles of respiration.  CARDIOVASCULAR: S1, S2 normal. No murmurs, rubs, or gallops.  ABDOMEN: Soft, nontender, nondistended. Bowel sounds present. No organomegaly or mass.  EXTREMITIES: No cyanosis, clubbing or edema b/l.    Sling To right upper extremity Tremors of the both upper extremities NEUROLOGIC: Arousable Not completely oriented to time place and person Moves all extremities PSYCHIATRIC: The patient is alert and oriented x 1.  SKIN: No obvious rash, lesion, or ulcer.   LABORATORY PANEL:   CBC Recent Labs  Lab 03/23/19 0243  WBC 5.4  HGB 9.4*  HCT 28.0*  PLT 107*   ------------------------------------------------------------------------------------------------------------------ Chemistries  Recent Labs  Lab 03/19/19 1659 03/20/19 1555  03/22/19 0501  03/23/19 0243  NA 134*  --    < > 140  --   K 3.4*  --    < > 3.3* 3.3*  CL 96*  --    < > 107  --   CO2 26  --    < > 28  --   GLUCOSE 92  --    < > 118*  --   BUN <5*  --    < > 7*  --   CREATININE 0.44*  --    < > 0.31*  --   CALCIUM 8.3*  --    < > 8.3*  --   MG  --  1.9  --   --   --   AST 138*  --   --   --   --   ALT 69*  --   --   --   --   ALKPHOS 170*  --   --   --   --   BILITOT 1.1  --   --   --   --    < > = values in this interval not displayed.   ------------------------------------------------------------------------------------------------------------------  Cardiac Enzymes No results for input(s): TROPONINI in the last 168 hours. ------------------------------------------------------------------------------------------------------------------  RADIOLOGY:  No results found.   ASSESSMENT AND PLAN:  68 year old male patient with history of alcohol abuse, hypertension, hyponatremia currently under hospitalist service for alcohol withdrawal  -Acute alcohol withdrawal Continue CIWA protocol Start oral Librium Thiamine folic acid supplements One-on-one observation for patient safety  -Thrombocytopenia Secondary to alcohol use Avoid blood thinner medication such as Lovenox and heparin Monitor platelet count  -Right humerus fracture Sling immobilizer in  place Orthopedic surgery consult appreciated, no acute intervention recommended  -Frequent falls secondary to alcohol use Imaging studies of head and spine no abnormality noted Physical therapy consult once patient clinically stable  -Acute hypokalemia Replace potassium.  -Hyponatremia secondary to beer potomania Improved  -Depression Continue Paxil  -History of paroxysmal atrial fibrillation Rate under control Continue atenolol and diltiazem Cardiac monitoring  -Wernicke's encephalopathy Secondary to chronic alcohol use IV thiamine supplementation  All the records are reviewed and case  discussed with Care Management/Social Worker. Management plans discussed with the patient, family and they are in agreement.  CODE STATUS: Full code  DVT Prophylaxis: SCDs  TOTAL TIME TAKING CARE OF THIS PATIENT: 37 minutes.   POSSIBLE D/C IN 2 to 3 DAYS, DEPENDING ON CLINICAL CONDITION.  Saundra Shelling M.D on 03/23/2019 at 10:19 AM  Between 7am to 6pm - Pager - [phone]  After 6pm go to [email or web address] - password [email or web address]  Office  [phone]  CC: Primary care physician; [name], MD  Note: This dictation was prepared with Dragon dictation along with smaller phrase technology. Any transcriptional errors that result from this process are unintentional.

## 2019-03-23 NOTE — Progress Notes (Signed)
Spoke with Dr.willis about current condition. Patient's increased confusion and un cooperation. [name] diaphoretic. [name] complaining of right shoulder pain. Administered an IM dose of ativan , and requested safety sitter from [facility].

## 2019-03-23 NOTE — Progress Notes (Signed)
Pt mumbling to self confused

## 2019-03-23 NOTE — Progress Notes (Signed)
Pt very confused and talking to people who are not here

## 2019-03-23 NOTE — Progress Notes (Signed)
Pt shaking and trying to get gloves off

## 2019-03-23 NOTE — Progress Notes (Signed)
Pt given water after coughing a few times

## 2019-03-23 NOTE — Progress Notes (Signed)
Attempted to feed pt dinner, only ate a few bites and would not eat anymore despite encouragement to do so. Pt also stating he needs to get up. Informed pt he is weak and going through withdrawals causing him to have tremors.

## 2019-03-23 NOTE — Progress Notes (Signed)
Pt put onto bedpan for a few minutes then stated he could not go at the moment

## 2019-03-23 NOTE — Progress Notes (Signed)
Pt said he didn't want lunch tray

## 2019-03-24 LAB — BASIC METABOLIC PANEL
Anion gap: 8 (ref 5–15)
BUN: <5 mg/dL — ABNORMAL LOW (ref 8–23)
CO2: 26 mmol/L (ref 22–32)
Calcium: 8.3 mg/dL — ABNORMAL LOW (ref 8.9–10.3)
Chloride: 105 mmol/L (ref 98–111)
Creatinine, Ser: 0.41 mg/dL — ABNORMAL LOW (ref 0.61–1.24)
GFR calc Af Amer: >60 mL/min (ref >60)
GFR calc non Af Amer: >60 mL/min (ref >60)
Glucose, Bld: 93 mg/dL (ref 70–99)
Potassium: 3 mmol/L — ABNORMAL LOW (ref 3.5–5.1)
Sodium: 139 mmol/L (ref 135–145)

## 2019-03-24 MED ORDER — POTASSIUM CHLORIDE IN NACL 20-0.9 MEQ/L-% IV SOLN
INTRAVENOUS | Status: DC
  Administered 2019-03-24 – 2019-03-25 (×3): via INTRAVENOUS
  Filled 2019-03-24 (×4): qty 1000

## 2019-03-24 NOTE — Progress Notes (Signed)
Telemonitoring will be attempted on this patient. Pt is currently sleeping. Sitter at bedside states pt has been actively pulling at arm sling and when he has to urinate the pt will put legs over side of bed. Telemonitoring will be attempted per  director. I will continue to assess.

## 2019-03-24 NOTE — Progress Notes (Signed)
PT Cancellation Note  Patient Details Name: Douglas Peterson. MRN: [id number] DOB: 1951/10/08   Cancelled Treatment:    Reason Eval/Treat Not Completed: Other (comment)([name] awake so PT attempted eval. Upon entering room patient combative and agitated, CNA/sitter in room reports patient has been agitated and has to wear mitts now due to pulling lines/agitation. Will attempt again at later time/date. )  Janna Arch, PT, DPT   03/24/2019, 9:03 AM

## 2019-03-24 NOTE — Progress Notes (Signed)
Nutrition Follow Up Note   DOCUMENTATION CODES:   Not applicable  INTERVENTION:   Magic cup TID with meals, each supplement provides 290 kcal and 9 grams of protein  MVI and folic acid daily in setting of etoh abuse.   Continue high dose IV thiamine regimen as pt at high risk for Wernicke's.   Recommend monitor K, Mg, and P labs as pt likely at high refeeding risk.     NUTRITION DIAGNOSIS:   Predicted suboptimal nutrient intake related to social / environmental circumstances(etoh abuse ) as evidenced by other (comment)(per chart review ).  GOAL:   Patient will meet greater than or equal to 90% of their needs  -progressing   MONITOR:   PO intake, Supplement acceptance, Labs, Weight trends, Skin, I & O's  ASSESSMENT:   68 y.o. male with a known history of hypertension, alcohol abuse who presented to the ED with right shoulder pain after a fall.  Right shoulder x-ray showed a fracture in the proximal humerus.    RD working remotely.  Pt with improving appetite and oral intake; pt eating 90% of meals today. Pt receiving Magic Cups on meals trays. Pt at high refeed risk; recommend check phosphorus and magnesium labs tomorrow morning. Pt still receiving high dose IV thiamine. No new weight since 5/17; will request weekly weights.   Pt previously diagnosed with moderate malnutrition. Pt at high risk for developing severe malnutrition but unable to diagnose at this time as NFPE cannot be performed.   Medications reviewed and include: aspirin, folic acid, MVI, paxil, thiamine, vitamin C, NaCl @75ml /hr  Labs reviewed: K 3.0(L), BUN <5(L), creat 0.41(L) Hgb 9.4(L), Hct 28.0(L)  Diet Order:   Diet Order            DIET - DYS 1 Room service appropriate? Yes; Fluid consistency: Honey Thick  Diet effective now             EDUCATION NEEDS:   No education needs have been identified at this time  Skin:  Skin Assessment: Reviewed RN Assessment  Last BM:  pta  Height:    Ht Readings from Last 1 Encounters:  03/20/19 5' 2"  (1.575 m)    Weight:   Wt Readings from Last 1 Encounters:  03/20/19 50.6 kg    Ideal Body Weight:  53.6 kg  BMI:  Body mass index is 20.39 kg/m.  Estimated Nutritional Needs:   Kcal:  1500-1700kcal/day   Protein:  75-85g/day   Fluid:  >1.3L/day   Koleen Distance MS, RD, LDN Pager #- [phone] Office#- [phone] After Hours Pager: [phone]

## 2019-03-24 NOTE — Progress Notes (Signed)
[place] at [place] NAME: Douglas Peterson    MR#:  [id number]  DATE OF BIRTH:  [birth year]  SUBJECTIVE:  CHIEF COMPLAINT:   Chief Complaint  Patient presents with  . Shoulder Injury  Patient seen and evaluated today Lethargic  Received ativan for withdrawl Was agitated Has tremors of the upper extremities Agitated On CIWA protocol Has sling to right arm 1:1 sitter for patient safety   REVIEW OF SYSTEMS:    ROS  Could not be obtained as patient is in withdrawal  DRUG ALLERGIES:  No Known Allergies  VITALS:  Blood pressure 120/71, pulse 68, temperature (!) 97.5 F (36.4 C), temperature source Axillary, resp. rate 20, height 5' 2"  (1.575 m), weight 50.6 kg, SpO2 95 %.  PHYSICAL EXAMINATION:   Physical Exam  GENERAL:  68 y.o.-year-old patient lying in the bed  EYES: Pupils equal, round, reactive to light and accommodation. No scleral icterus. Extraocular muscles intact.  HEENT: Head atraumatic, normocephalic. Oropharynx and nasopharynx clear.  NECK:  Supple, no jugular venous distention. No thyroid enlargement, no tenderness.  LUNGS: Normal breath sounds bilaterally, no wheezing, rales, rhonchi. No use of accessory muscles of respiration.  CARDIOVASCULAR: S1, S2 normal. No murmurs, rubs, or gallops.  ABDOMEN: Soft, nontender, nondistended. Bowel sounds present. No organomegaly or mass.  EXTREMITIES: No cyanosis, clubbing or edema b/l.    Sling To right upper extremity Tremors of the both upper extremities NEUROLOGIC: Arousable Not completely oriented to time place and person Moves all extremities PSYCHIATRIC: The patient is alert and oriented x 1.  SKIN: No obvious rash, lesion, or ulcer.   LABORATORY PANEL:   CBC Recent Labs  Lab 03/23/19 0243  WBC 5.4  HGB 9.4*  HCT 28.0*  PLT 107*   ------------------------------------------------------------------------------------------------------------------ Chemistries   Recent Labs  Lab 03/19/19 1659 03/20/19 1555  03/24/19 0549  NA 134*  --    < > 139  K 3.4*  --    < > 3.0*  CL 96*  --    < > 105  CO2 26  --    < > 26  GLUCOSE 92  --    < > 93  BUN <5*  --    < > <5*  CREATININE 0.44*  --    < > 0.41*  CALCIUM 8.3*  --    < > 8.3*  MG  --  1.9  --   --   AST 138*  --   --   --   ALT 69*  --   --   --   ALKPHOS 170*  --   --   --   BILITOT 1.1  --   --   --    < > = values in this interval not displayed.   ------------------------------------------------------------------------------------------------------------------  Cardiac Enzymes No results for input(s): TROPONINI in the last 168 hours. ------------------------------------------------------------------------------------------------------------------  RADIOLOGY:  No results found.   ASSESSMENT AND PLAN:  68 year old male patient with history of alcohol abuse, hypertension, hyponatremia currently under hospitalist service for alcohol withdrawal  -Acute alcohol withdrawal Continue CIWA protocol continue oral Librium Thiamine folic acid supplements One-on-one observation for patient safety  -Thrombocytopenia Secondary to alcohol use Avoid blood thinner medication such as Lovenox and heparin Monitor platelet count  -Right humerus fracture Sling immobilizer in place Orthopedic surgery consult appreciated, no acute intervention recommended  -Frequent falls secondary to alcohol use Imaging studies of head and spine no abnormality noted Physical therapy consult once  patient clinically stable  -Acute hypokalemia Replace potassium. IV replacement  -Hyponatremia secondary to beer potomania Improved  -Depression Continue Paxil  -History of paroxysmal atrial fibrillation Rate under control Continue atenolol and diltiazem Cardiac monitoring  -Wernicke's encephalopathy Secondary to chronic alcohol use IV thiamine supplementation as per pharmacy  All the records are  reviewed and case discussed with Care Management/Social Worker. Management plans discussed with the patient, family and they are in agreement.  CODE STATUS: Full code  DVT Prophylaxis: SCDs  TOTAL TIME TAKING CARE OF THIS PATIENT: 35 minutes.   POSSIBLE D/C IN 2 to 3 DAYS, DEPENDING ON CLINICAL CONDITION.  Saundra Shelling M.D on 03/24/2019 at 11:36 AM  Between 7am to 6pm - Pager - [phone]  After 6pm go to [email or web address] - password [email or web address] [facility]  Office  [phone]  CC: Primary care physician; [name], MD  Note: This dictation was prepared with Dragon dictation along with smaller phrase technology. Any transcriptional errors that result from this process are unintentional.

## 2019-03-24 NOTE — Progress Notes (Signed)
PT Cancellation Note  Patient Details Name: Douglas Peterson. MRN: [id number] DOB: [birth year]   Cancelled Treatment:    Reason Eval/Treat Not Completed: Medical issues which prohibited therapy(Patient consistently unable to participate with skilled PT services (x4 days).  Will complete order at this time.  Please re-consult as medically appropriate and able to participate.)    H. Owens Shark, PT, DPT, NCS 03/24/19, 9:46 PM [phone]

## 2019-03-24 NOTE — TOC Progression Note (Signed)
Transition of Care (TOC) - Progression Note    Patient Details  Name: Douglas Peterson. MRN: [id number] Date of Birth: 23-Aug-1951  Transition of Care [facility]) CM/SW Contact  Shade Flood, LCSW Phone Number: 03/24/2019, 9:38 AM  Clinical Narrative:      Pt continues on withdrawal protocol. He is unable to participate in [facility] assessment yet. Will assess when pt condition improves.      Expected Discharge Plan and Services                                                 Social Determinants of Health (SDOH) Interventions    Readmission Risk Interventions No flowsheet data found.

## 2019-03-25 LAB — BASIC METABOLIC PANEL
Anion gap: 7 (ref 5–15)
BUN: 7 mg/dL — ABNORMAL LOW (ref 8–23)
CO2: 26 mmol/L (ref 22–32)
Calcium: 8.9 mg/dL (ref 8.9–10.3)
Chloride: 106 mmol/L (ref 98–111)
Creatinine, Ser: 0.35 mg/dL — ABNORMAL LOW (ref 0.61–1.24)
GFR calc Af Amer: >60 mL/min (ref >60)
GFR calc non Af Amer: >60 mL/min (ref >60)
Glucose, Bld: 121 mg/dL — ABNORMAL HIGH (ref 70–99)
Potassium: 3.2 mmol/L — ABNORMAL LOW (ref 3.5–5.1)
Sodium: 139 mmol/L (ref 135–145)

## 2019-03-25 LAB — PHOSPHORUS: Phosphorus: 3.8 mg/dL (ref 2.5–4.6)

## 2019-03-25 LAB — MAGNESIUM: Magnesium: 1.8 mg/dL (ref 1.7–2.4)

## 2019-03-25 MED ORDER — POTASSIUM CHLORIDE IN NACL 40-0.9 MEQ/L-% IV SOLN
INTRAVENOUS | Status: DC
  Administered 2019-03-25 – 2019-03-27 (×4): 75 mL/h via INTRAVENOUS
  Filled 2019-03-25 (×6): qty 1000

## 2019-03-25 NOTE — Care Management Important Message (Signed)
Important Message  Patient Details  Name: Douglas Peterson. MRN: [id number] Date of Birth: 10-13-1951   Medicare Important Message Given:  Yes    [name] 03/25/2019, 11:50 AM

## 2019-03-25 NOTE — Progress Notes (Addendum)
[place] at [place] NAME: Douglas Peterson    MR#:  [id number]  DATE OF BIRTH:  06-Aug-1951  SUBJECTIVE:  CHIEF COMPLAINT:   Chief Complaint  Patient presents with  . Shoulder Injury  Patient seen and evaluated today Lethargic  Receiving ativan for withdrawl Periods of agitation Has tremors of the upper extremities Agitated On CIWA protocol Has sling to right arm 1:1 sitter for patient safety   REVIEW OF SYSTEMS:    ROS  Could not be obtained as patient is in withdrawal  DRUG ALLERGIES:  No Known Allergies  VITALS:  Blood pressure 126/73, pulse 63, temperature 97.6 F (36.4 C), temperature source Oral, resp. rate 20, height 5' 2"  (1.575 m), weight 50.6 kg, SpO2 99 %.  PHYSICAL EXAMINATION:   Physical Exam  GENERAL:  67 y.o.-year-old patient lying in the bed  EYES: Pupils equal, round, reactive to light and accommodation. No scleral icterus. Extraocular muscles intact.  HEENT: Head atraumatic, normocephalic. Oropharynx and nasopharynx clear.  NECK:  Supple, no jugular venous distention. No thyroid enlargement, no tenderness.  LUNGS: Normal breath sounds bilaterally, no wheezing, rales, rhonchi. No use of accessory muscles of respiration.  CARDIOVASCULAR: S1, S2 normal. No murmurs, rubs, or gallops.  ABDOMEN: Soft, nontender, nondistended. Bowel sounds present. No organomegaly or mass.  EXTREMITIES: No cyanosis, clubbing or edema b/l.    Sling To right upper extremity Tremors of the both upper extremities NEUROLOGIC: Arousable Not completely oriented to time place and person Moves all extremities PSYCHIATRIC: The patient is alert and oriented x 1.  SKIN: No obvious rash, lesion, or ulcer.   LABORATORY PANEL:   CBC Recent Labs  Lab 03/23/19 0243  WBC 5.4  HGB 9.4*  HCT 28.0*  PLT 107*   ------------------------------------------------------------------------------------------------------------------ Chemistries   Recent Labs  Lab 03/19/19 1659  03/25/19 0357  NA 134*   < > 139  K 3.4*   < > 3.2*  CL 96*   < > 106  CO2 26   < > 26  GLUCOSE 92   < > 121*  BUN <5*   < > 7*  CREATININE 0.44*   < > 0.35*  CALCIUM 8.3*   < > 8.9  MG  --    < > 1.8  AST 138*  --   --   ALT 69*  --   --   ALKPHOS 170*  --   --   BILITOT 1.1  --   --    < > = values in this interval not displayed.   ------------------------------------------------------------------------------------------------------------------  Cardiac Enzymes No results for input(s): TROPONINI in the last 168 hours. ------------------------------------------------------------------------------------------------------------------  RADIOLOGY:  No results found.   ASSESSMENT AND PLAN:  68 year old male patient with history of alcohol abuse, hypertension, hyponatremia currently under hospitalist service for alcohol withdrawal  -Acute alcohol withdrawal Continue CIWA protocol continue oral Librium Thiamine folic acid supplements One-on-one observation for patient safety  -Thrombocytopenia Secondary to alcohol use Avoid blood thinner medication such as Lovenox and heparin Monitor platelet count  -Right humerus fracture Sling immobilizer in place Orthopedic surgery consult appreciated, no acute intervention recommended  -Frequent falls secondary to alcohol use Imaging studies of head and spine no abnormality noted Physical therapy consult once patient clinically stable  -Acute hypokalemia Replace potassium. IV replacement  -Hyponatremia secondary to beer potomania Improved  -Depression Continue Paxil  -History of paroxysmal atrial fibrillation Rate under control Continue atenolol and diltiazem Cardiac monitoring  -Wernicke's encephalopathy Secondary  to chronic alcohol use IV thiamine supplementation per pharmacy protocol  -Ambulatory dysfunction Physical therapy once patient able to participate  All the records  are reviewed and case discussed with Care Management/Social Worker. Management plans discussed with the patient, family and they are in agreement.  CODE STATUS: Full code  DVT Prophylaxis: SCDs  TOTAL TIME TAKING CARE OF THIS PATIENT: 37 minutes.   POSSIBLE D/C IN 2 to 3 DAYS, DEPENDING ON CLINICAL CONDITION.  Saundra Shelling M.D on 03/25/2019 at 11:49 AM  Between 7am to 6pm - Pager - [phone]  After 6pm go to [email or web address] - password [email or web address] [facility]  Office  [phone]  CC: Primary care physician; [name], MD  Note: This dictation was prepared with Dragon dictation along with smaller phrase technology. Any transcriptional errors that result from this process are unintentional.

## 2019-03-26 LAB — CBC WITH DIFFERENTIAL/PLATELET
Abs Immature Granulocytes: 0.01 10*3/uL (ref 0.00–0.07)
Basophils Absolute: 0 10*3/uL (ref 0.0–0.1)
Basophils Relative: 1 %
Eosinophils Absolute: 0.1 10*3/uL (ref 0.0–0.5)
Eosinophils Relative: 3 %
HCT: 34.5 % — ABNORMAL LOW (ref 39.0–52.0)
Hemoglobin: 11.5 g/dL — ABNORMAL LOW (ref 13.0–17.0)
Immature Granulocytes: 0 %
Lymphocytes Relative: 39 %
Lymphs Abs: 1.4 10*3/uL (ref 0.7–4.0)
MCH: 31.8 pg (ref 26.0–34.0)
MCHC: 33.3 g/dL (ref 30.0–36.0)
MCV: 95.3 fL (ref 80.0–100.0)
Monocytes Absolute: 0.6 10*3/uL (ref 0.1–1.0)
Monocytes Relative: 17 %
Neutro Abs: 1.4 10*3/uL — ABNORMAL LOW (ref 1.7–7.7)
Neutrophils Relative %: 40 %
Platelets: 157 10*3/uL (ref 150–400)
RBC: 3.62 MIL/uL — ABNORMAL LOW (ref 4.22–5.81)
RDW: 15.5 % (ref 11.5–15.5)
WBC: 3.6 10*3/uL — ABNORMAL LOW (ref 4.0–10.5)
nRBC: 0 % (ref 0.0–0.2)

## 2019-03-26 LAB — MAGNESIUM: Magnesium: 1.9 mg/dL (ref 1.7–2.4)

## 2019-03-26 LAB — POTASSIUM: Potassium: 4 mmol/L (ref 3.5–5.1)

## 2019-03-26 MED ORDER — POTASSIUM CHLORIDE CRYS ER 20 MEQ PO TBCR
40.0000 meq | EXTENDED_RELEASE_TABLET | Freq: Once | ORAL | Status: AC
  Administered 2019-03-26: 40 meq via ORAL
  Filled 2019-03-26: qty 2

## 2019-03-26 MED ORDER — MORPHINE SULFATE (PF) 2 MG/ML IV SOLN
2.0000 mg | INTRAVENOUS | Status: DC | PRN
  Administered 2019-03-26 – 2019-03-27 (×2): 2 mg via INTRAVENOUS
  Filled 2019-03-26 (×2): qty 1

## 2019-03-26 NOTE — Progress Notes (Addendum)
Patient refusing/spitting out all PO meds. Refusing to eat or drink. MD made aware- will continue to monitor patient.   Update 1400: patient ate a good lunch per nurse tech, patient was able to take one po medication with applesauce. Patient also complained of pain and nausea. PRN pain and nausea med given. When re-assessed, patient was asleep after giving pain med and stated that PRN med for nausea was helpful. Patient now more verbal and following commands. Tele sitter still in place. Will continue to monitor patient.   Update 1619: patient spoke to daughter [name] over the phone.

## 2019-03-26 NOTE — Progress Notes (Signed)
[place] at [place] NAME: Douglas Peterson    MR#:  [id number]  DATE OF BIRTH:  1951/02/22  SUBJECTIVE:   Chief Complaint  Patient presents with  . Shoulder Injury   Remains altered this morning, restless and trying to get out of the bed. He is awake and oriented to person, place, and year, he however did not know the month or current president. Patient continues to complain of "pain" however he is unable to specify the location.  REVIEW OF SYSTEMS:  Unable to obtain due to altered mental status  DRUG ALLERGIES:  No Known Allergies VITALS:  Blood pressure 130/77, pulse 60, temperature 97.7 F (36.5 C), temperature source Oral, resp. rate 18, height 5' 2"  (1.575 m), weight 50.6 kg, SpO2 99 %. PHYSICAL EXAMINATION:   GENERAL:  68 y.o.-year-old patient lying in the bed with no acute distress.  EYES: Pupils equal, round, reactive to light and accommodation. No scleral icterus. Extraocular muscles intact.  HEENT: Head atraumatic, normocephalic. Oropharynx and nasopharynx clear.  NECK:  Supple, no jugular venous distention. No thyroid enlargement, no tenderness.  LUNGS: Normal breath sounds bilaterally, no wheezing, rales,rhonchi or crepitation. No use of accessory muscles of respiration.  CARDIOVASCULAR: S1, S2 normal. No murmurs, rubs, or gallops.  ABDOMEN: Soft, nontender, nondistended. Bowel sounds present. No organomegaly or mass.  EXTREMITIES: No pedal edema, cyanosis, or clubbing.  NEUROLOGIC: Cranial nerves II through XII are intact. Muscle strength 5/5 in all extremities. Sensation intact. Gait not checked.  PSYCHIATRIC: The patient is alert and oriented x 3. He knew the year but not month. State current president is Shon Millet. Did not know the month. SKIN: No obvious rash, lesion, or ulcer.   DATA REVIEWED:  LABORATORY PANEL:  Male CBC Recent Labs  Lab 03/26/19 0716  WBC 3.6*  HGB 11.5*  HCT 34.5*  PLT 157    ------------------------------------------------------------------------------------------------------------------ Chemistries  Recent Labs  Lab 03/19/19 1659  03/25/19 0357 03/26/19 0716  NA 134*   < > 139  --   K 3.4*   < > 3.2* 4.0  CL 96*   < > 106  --   CO2 26   < > 26  --   GLUCOSE 92   < > 121*  --   BUN <5*   < > 7*  --   CREATININE 0.44*   < > 0.35*  --   CALCIUM 8.3*   < > 8.9  --   MG  --    < > 1.8 1.9  AST 138*  --   --   --   ALT 69*  --   --   --   ALKPHOS 170*  --   --   --   BILITOT 1.1  --   --   --    < > = values in this interval not displayed.   RADIOLOGY:  No results found. ASSESSMENT AND PLAN:   68 y.o. 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  Right shoulder x-ray showed fracture in the proximal humerus.  Now noted to be in withdrawal.  1. Acute  EtOH Withdrawal  - Monitor CMP, Daily BMP+Mg - Daily Thiamine, Folate, MVI  PO - Continue Librium - Insomnia: Melatonin 37m - SW consult for cessation resources - PT/OT evaluation for mobility - CIWA +/- Standing Protocol -  Seizure precautions  2. Right humerus fracture - post traumatic fall due to alcohol intoxication. Patient with hx of recurrent falls due to intoxication - Sling immobilizer in place - PRN pain managment - Orthopedic surgery consult appreciated, no acute intervention recommended - PT/OT consult as above  3. Hyponatremia - Likely secondary to beer potomania - Improved  4. Hypokalemia - Improved with IV replacement - Follow labs in am  5. History of paroxysmal atrial fibrillation - Rate controlled on atenolol and diltiazem - Continue telemetry monitoring   6. Wernicke's encephalopathy - Secondary to chronic alcohol use - IV thiamine supplementation per pharmacy protocol - Telesitter  7.Thrombocytopenia - Likely  due to alcohol use, Platelets this am normal - No signs of active bleeding or infection - Avoid antiplatelets or anticoagulation   All the records are reviewed and case discussed with Care Management/Social Worker. Management plans discussed with the patient, family and they are in agreement.  CODE STATUS: Full Code  TOTAL TIME TAKING CARE OF THIS PATIENT: 36 minutes.   More than 50% of the time was spent in counseling/coordination of care: YES  POSSIBLE D/C IN 2 DAYS, DEPENDING ON CLINICAL CONDITION.   on 03/26/2019 at 8:25 AM  This patient was staffed with Dr. Posey Pronto, [facility] who personally evaluated patient, reviewed documentation and agreed with assessment and plan of care as above.  Rufina Falco, DNP, FNP-BC [employer] Nurse Practitioner   Between 7am to 6pm - Pager [phone]  After 6pm go to [email or web address] - [employer] [facility] [facility]  Office  [phone]  CC: Primary care physician; [name], MD  Note: This dictation was prepared with Dragon dictation along with smaller phrase technology. Any transcriptional errors that result from this process are unintentional.

## 2019-03-27 LAB — BASIC METABOLIC PANEL
Anion gap: 6 (ref 5–15)
BUN: 7 mg/dL — ABNORMAL LOW (ref 8–23)
CO2: 28 mmol/L (ref 22–32)
Calcium: 8.9 mg/dL (ref 8.9–10.3)
Chloride: 107 mmol/L (ref 98–111)
Creatinine, Ser: 0.42 mg/dL — ABNORMAL LOW (ref 0.61–1.24)
GFR calc Af Amer: >60 mL/min (ref >60)
GFR calc non Af Amer: >60 mL/min (ref >60)
Glucose, Bld: 92 mg/dL (ref 70–99)
Potassium: 4.5 mmol/L (ref 3.5–5.1)
Sodium: 141 mmol/L (ref 135–145)

## 2019-03-27 MED ORDER — HALOPERIDOL LACTATE 5 MG/ML IJ SOLN
INTRAMUSCULAR | Status: AC
  Administered 2019-03-27: 17:00:00 5 mg via INTRAMUSCULAR
  Filled 2019-03-27: qty 1

## 2019-03-27 MED ORDER — LORAZEPAM 2 MG/ML IJ SOLN: 0.0000 mg | Freq: Two times a day (BID) | INTRAMUSCULAR | Status: DC

## 2019-03-27 MED ORDER — LORAZEPAM 2 MG/ML IJ SOLN
2.0000 mg | INTRAMUSCULAR | Status: DC | PRN
  Administered 2019-03-27: 2 mg via INTRAMUSCULAR

## 2019-03-27 MED ORDER — ZIPRASIDONE MESYLATE 20 MG IM SOLR
10.0000 mg | Freq: Once | INTRAMUSCULAR | Status: DC
  Filled 2019-03-27: qty 20

## 2019-03-27 MED ORDER — HALOPERIDOL LACTATE 5 MG/ML IJ SOLN
5.0000 mg | Freq: Once | INTRAMUSCULAR | Status: AC
  Administered 2019-03-27: 5 mg via INTRAMUSCULAR

## 2019-03-27 MED ORDER — LORAZEPAM 2 MG/ML IJ SOLN
2.0000 mg | INTRAMUSCULAR | Status: DC | PRN
  Filled 2019-03-27: qty 1

## 2019-03-27 MED ORDER — HALOPERIDOL LACTATE 5 MG/ML IJ SOLN: 2.0000 mg | INTRAMUSCULAR | Status: DC | PRN
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
## 2019-03-27 NOTE — Plan of Care (Signed)
  Problem: Pain Managment: Goal: General experience of comfort will improve Outcome: Progressing   Problem: Safety: Goal: Ability to remain free from injury will improve Outcome: Progressing   
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
## 2019-03-27 NOTE — Plan of Care (Signed)
  Problem: Education: Goal: Knowledge of General Education information will improve Description Including pain rating scale, medication(s)/side effects and non-pharmacologic comfort measures Outcome: Not Progressing Note:  Pt required repeated reminding that he fell and fractured his arm.  Reoriented to place, time, and situation.   Problem: Activity: Goal: Risk for activity intolerance will decrease Outcome: Not Progressing Note:  Limited ability to assist with changing position due to right arm in a sling and mittens on hands for safety.   Problem: Nutrition: Goal: Adequate nutrition will be maintained Outcome: Not Progressing Note:  Requires assistance with feeding.  Poor PO take (about 50% of meals).   Problem: Coping: Goal: Level of anxiety will decrease Outcome: Not Progressing Note:  Remains agitated and confused.  Pt believes that he is in jail or in the basement.    Problem: Health Behavior/Discharge Planning: Goal: Ability to manage health-related needs will improve Outcome: Progressing   Problem: Clinical Measurements: Goal: Ability to maintain clinical measurements within normal limits will improve Outcome: Progressing Goal: Will remain free from infection Outcome: Progressing Goal: Diagnostic test results will improve Outcome: Progressing Goal: Respiratory complications will improve Outcome: Progressing Goal: Cardiovascular complication will be avoided Outcome: Progressing   Problem: Elimination: Goal: Will not experience complications related to bowel motility Outcome: Progressing Goal: Will not experience complications related to urinary retention Outcome: Progressing   Problem: Pain Managment: Goal: General experience of comfort will improve Outcome: Progressing   Problem: Safety: Goal: Ability to remain free from injury will improve Outcome: Progressing   Problem: Skin Integrity: Goal: Risk for impaired skin integrity will decrease Outcome:  Progressing   Problem: Education: Goal: Knowledge of disease or condition will improve Outcome: Progressing Goal: Understanding of discharge needs will improve Outcome: Progressing   Problem: Health Behavior/Discharge Planning: Goal: Ability to identify changes in lifestyle to reduce recurrence of condition will improve Outcome: Progressing Goal: Identification of resources available to assist in meeting health care needs will improve Outcome: Progressing   Problem: Physical Regulation: Goal: Complications related to the disease process, condition or treatment will be avoided or minimized Outcome: Progressing   Problem: Safety: Goal: Ability to remain free from injury will improve Outcome: Progressing
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
## 2019-03-28 LAB — BASIC METABOLIC PANEL
Anion gap: 10 (ref 5–15)
BUN: 8 mg/dL (ref 8–23)
CO2: 24 mmol/L (ref 22–32)
Calcium: 9 mg/dL (ref 8.9–10.3)
Chloride: 100 mmol/L (ref 98–111)
Creatinine, Ser: 0.47 mg/dL — ABNORMAL LOW (ref 0.61–1.24)
GFR calc Af Amer: >60 mL/min (ref >60)
GFR calc non Af Amer: >60 mL/min (ref >60)
Glucose, Bld: 104 mg/dL — ABNORMAL HIGH (ref 70–99)
Potassium: 3.5 mmol/L (ref 3.5–5.1)
Sodium: 134 mmol/L — ABNORMAL LOW (ref 135–145)

## 2019-03-28 LAB — CBC WITH DIFFERENTIAL/PLATELET
Abs Immature Granulocytes: 0.01 10*3/uL (ref 0.00–0.07)
Basophils Absolute: 0 10*3/uL (ref 0.0–0.1)
Basophils Relative: 0 %
Eosinophils Absolute: 0.1 10*3/uL (ref 0.0–0.5)
Eosinophils Relative: 1 %
HCT: 35.1 % — ABNORMAL LOW (ref 39.0–52.0)
Hemoglobin: 11.9 g/dL — ABNORMAL LOW (ref 13.0–17.0)
Immature Granulocytes: 0 %
Lymphocytes Relative: 35 %
Lymphs Abs: 2.1 10*3/uL (ref 0.7–4.0)
MCH: 32 pg (ref 26.0–34.0)
MCHC: 33.9 g/dL (ref 30.0–36.0)
MCV: 94.4 fL (ref 80.0–100.0)
Monocytes Absolute: 0.8 10*3/uL (ref 0.1–1.0)
Monocytes Relative: 14 %
Neutro Abs: 3 10*3/uL (ref 1.7–7.7)
Neutrophils Relative %: 50 %
Platelets: 223 10*3/uL (ref 150–400)
RBC: 3.72 MIL/uL — ABNORMAL LOW (ref 4.22–5.81)
RDW: 15.5 % (ref 11.5–15.5)
WBC: 6 10*3/uL (ref 4.0–10.5)
nRBC: 0 % (ref 0.0–0.2)

## 2019-03-28 MED ORDER — DOXEPIN HCL 10 MG PO CAPS
10.0000 mg | ORAL_CAPSULE | Freq: Every day | ORAL | Status: DC
  Administered 2019-03-28 – 2019-03-31 (×4): 10 mg via ORAL
  Filled 2019-03-28 (×5): qty 1

## 2019-03-28 MED ORDER — DOXEPIN HCL 10 MG/ML PO CONC
10.0000 mg | Freq: Every day | ORAL | Status: DC
  Filled 2019-03-28: qty 1

## 2019-03-28 NOTE — Progress Notes (Signed)
Pt adjusted in bed, pt throwing legs over rails

## 2019-03-28 NOTE — Progress Notes (Signed)
[place] at [place] NAME: Douglas Peterson    MR#:  [id number]  DATE OF BIRTH:  [birth year]  SUBJECTIVE:   Chief Complaint  Patient presents with  . Shoulder Injury   Remains altered this morning, restless and still trying to get out of the bed. He has a Actuary at the bedside. Per nursing staf,patient had difficulty sleeping last night.  REVIEW OF SYSTEMS:  Unable to obtain due to altered mental status  DRUG ALLERGIES:  No Known Allergies VITALS:  Blood pressure 123/71, pulse 71, temperature 98.1 F (36.7 C), temperature source Oral, resp. rate 15, height 5' 2"  (1.575 m), weight 61.7 kg, SpO2 96 %. PHYSICAL EXAMINATION:   GENERAL:  68 y.o.-year-old patient lying in the bed with sitter at the bedside. In no acute distress. EYES: Pupils equal, round, reactive to light and accommodation. No scleral icterus. Extraocular muscles intact.  HEENT: Head atraumatic, normocephalic. Oropharynx and nasopharynx clear.  NECK:  Supple, no jugular venous distention. No thyroid enlargement, no tenderness.  LUNGS: Normal breath sounds bilaterally, no wheezing, rales,rhonchi or crepitation. No use of accessory muscles of respiration.  CARDIOVASCULAR: S1, S2 normal. No murmurs, rubs, or gallops.  ABDOMEN: Soft, nontender, nondistended. Bowel sounds present. No organomegaly or mass.  EXTREMITIES: No pedal edema, cyanosis, or clubbing.  NEUROLOGIC: Cranial nerves II through XII are intact. Muscle strength 5/5 in all extremities. Sensation intact. Gait not checked.  PSYCHIATRIC: The patient is alert and oriented to  SKIN: No obvious rash, lesion, or ulcer.   DATA REVIEWED:  LABORATORY PANEL:  Male CBC Recent Labs  Lab 03/28/19 0913  WBC 6.0  HGB 11.9*  HCT 35.1*  PLT 223   ------------------------------------------------------------------------------------------------------------------ Chemistries  Recent Labs  Lab 03/26/19 0716  03/28/19 0913   NA  --    < > 134*  K 4.0   < > 3.5  CL  --    < > 100  CO2  --    < > 24  GLUCOSE  --    < > 104*  BUN  --    < > 8  CREATININE  --    < > 0.47*  CALCIUM  --    < > 9.0  MG 1.9  --   --    < > = values in this interval not displayed.   RADIOLOGY:  No results found. ASSESSMENT AND PLAN:   68 y.o. 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  Right shoulder x-ray showed fracture in the proximal humerus.  Now noted to be in withdrawal and altered.  1. Acute  EtOH Withdrawal  - Daily BMP+Mg normal thus far - Continue Daily Thiamine, Folate, MVI  PO.  - Difficulty sleeping last night, will start Doxepin - SW consult for cessation resources - PT/OT evaluation for mobility - CIWA +/- Standing Protocol - Seizure precautions  2. Right humerus fracture - post traumatic fall due to alcohol intoxication. Patient with hx of recurrent falls due to intoxication - Sling immobilizer in place - PRN pain managment - Orthopedic surgery consult appreciated, no acute intervention recommended - PT/OT consult as above  3. Hyponatremia - Likely secondary to beer potomania - Improved  4. Hypokalemia - Improved with IV replacement - Continue to monitor  5. History of paroxysmal atrial fibrillation - Rate controlled on atenolol and diltiazem - Continue  telemetry monitoring   6. Wernicke's encephalopathy- Secondary to chronic alcohol use - IV thiamine supplementation per pharmacy protocol - Telesitter  7.Thrombocytopenia - Likely due to alcohol use now resolved - No signs of active bleeding or infection - Avoid antiplatelets or anticoagulation   All the records are reviewed and case discussed with Care Management/Social Worker. Management plans discussed with the patient, family and they are in agreement.  CODE STATUS: Full Code   TOTAL TIME TAKING CARE OF THIS PATIENT: 36 minutes.   More than 50% of the time was spent in counseling/coordination of care: YES  POSSIBLE D/C IN 2 DAYS, DEPENDING ON CLINICAL CONDITION.   on 03/28/2019 at 10:25 AM  This patient was staffed with Dr. Posey Pronto, [facility] who personally evaluated patient, reviewed documentation and agreed with assessment and plan of care as above.  Rufina Falco, DNP, FNP-BC [employer] Nurse Practitioner   Between 7am to 6pm - Pager [phone]  After 6pm go to [email or web address] - [employer] [place] [facility]  Office  [phone]  CC: Primary care physician; [name], MD  Note: This dictation was prepared with Dragon dictation along with smaller phrase technology. Any transcriptional errors that result from this process are unintentional.

## 2019-03-28 NOTE — Progress Notes (Signed)
Pt still talking and throwing legs out of bed trying to get up

## 2019-03-28 NOTE — Plan of Care (Signed)

## 2019-03-28 NOTE — Progress Notes (Signed)
SLP Cancellation Note  Patient Details Name: Douglas Peterson. MRN: [id number] DOB: [birth year]   Cancelled treatment:       Reason Eval/Treat Not Completed: Other (comment). Chart reviewed. Nsg consulted who reported toleration of current Dysphagia I (puree) diet with honey thick liquids by TSP with no overt s/s aspiration. Nsg reported persistant confusion today. Pt did not answer any SLP questions with appropriate answers. Rec hold PO feeding with any overt s/s aspiration.  Pt unlikely to obtain adequate nutrition via mouth, MD may consider alternative means of nutrition if deemed appropriate. SLP to continue to f/u with toleration of diet.    ,, MA, CCC-SLP 03/28/2019, 3:04 PM

## 2019-03-28 NOTE — Progress Notes (Signed)
Pt still confused, asking if we can go in the house sometime today and telling me to unlock the door

## 2019-03-28 NOTE — Plan of Care (Signed)
  Problem: Nutrition: Goal: Adequate nutrition will be maintained Outcome: Not Progressing   Problem: Safety: Goal: Ability to remain free from injury will improve Outcome: Not Progressing  Now with 1:1 sitter Problem: Education: Goal: Knowledge of disease or condition will improve Outcome: Not Progressing  No evidence of learning Problem: Safety: Goal: Ability to remain free from injury will improve Outcome: Not Progressing  Now with 1:1 sitter

## 2019-03-29 DIAGNOSIS — Z515 Encounter for palliative care: Secondary | ICD-10-CM

## 2019-03-29 DIAGNOSIS — F10230 Alcohol dependence with withdrawal, uncomplicated: Principal | ICD-10-CM

## 2019-03-29 DIAGNOSIS — Z7189 Other specified counseling: Secondary | ICD-10-CM

## 2019-03-29 MED ORDER — LORAZEPAM 2 MG/ML IJ SOLN: 1.0000 mg | INTRAMUSCULAR | Status: DC | PRN
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
## 2019-03-29 NOTE — Progress Notes (Addendum)
[facility] NAME: Douglas Peterson    MR#:  [id number]  DATE OF BIRTH:  [birth year]  SUBJECTIVE:   Chief Complaint  Patient presents with  . Shoulder Injury   Patient sleepy this morning. No reports of restless or agitation this morning but per night staff patient had intermittent episode of restlessness. He has a Actuary at the bedside.   REVIEW OF SYSTEMS:  Unable to obtain due to altered mental status  DRUG ALLERGIES:  No Known Allergies VITALS:  Blood pressure (!) 153/84, pulse 66, temperature (!) 97.4 F (36.3 C), temperature source Oral, resp. rate 18, height 5' 2"  (1.575 m), weight 61.7 kg, SpO2 96 %. PHYSICAL EXAMINATION:   GENERAL:  68 y.o.-year-old patient lying in the bed with sitter at the bedside. In no acute distress. EYES: Pupils equal, round, reactive to light and accommodation. No scleral icterus. Extraocular muscles intact.  HEENT: Head atraumatic, normocephalic. Oropharynx and nasopharynx clear.  NECK:  Supple, no jugular venous distention. No thyroid enlargement, no tenderness.  LUNGS: Normal breath sounds bilaterally, no wheezing, rales,rhonchi or crepitation. No use of accessory muscles of respiration.  CARDIOVASCULAR: S1, S2 normal. No murmurs, rubs, or gallops.  ABDOMEN: Soft, nontender, nondistended. Bowel sounds present. No organomegaly or mass.  EXTREMITIES: No pedal edema, cyanosis, or clubbing.  NEUROLOGIC: Cranial nerves II through XII are intact. Muscle strength 5/5 in all extremities. Sensation intact. Gait not checked.  PSYCHIATRIC: The patient is alert and oriented to  SKIN: No obvious rash, lesion, or ulcer.   DATA REVIEWED:  LABORATORY PANEL:  Male CBC Recent Labs  Lab 03/28/19 0913  WBC 6.0  HGB 11.9*  HCT 35.1*  PLT 223   ------------------------------------------------------------------------------------------------------------------ Chemistries  Recent Labs  Lab 03/26/19 0716   03/28/19 0913  NA  --    < > 134*  K 4.0   < > 3.5  CL  --    < > 100  CO2  --    < > 24  GLUCOSE  --    < > 104*  BUN  --    < > 8  CREATININE  --    < > 0.47*  CALCIUM  --    < > 9.0  MG 1.9  --   --    < > = values in this interval not displayed.   RADIOLOGY:  No results found. ASSESSMENT AND PLAN:   68 y.o. 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  Right shoulder x-ray showed fracture in the proximal humerus.  Now noted to be in withdrawal and altered.  1. Acute  EtOH Withdrawal  - Daily BMP+Mg normal thus far - Continue Daily Thiamine, Folate, MVI  PO.  - Difficulty sleeping last night, continue Doxepin - SW consult for cessation resources - PT/OT evaluation for mobility - CIWA +/- Standing Protocol - Seizure precautions  2. Right humerus fracture - post traumatic fall due to alcohol intoxication. Patient with hx of recurrent falls due to intoxication - Sling immobilizer in place - PRN pain managment - Orthopedic surgery consult appreciated, no acute intervention recommended - PT/OT consult as above  3. Hyponatremia - Likely secondary to beer potomania - Improved  4. Hypokalemia - Improved with IV replacement - Continue to monitor  5. History of paroxysmal atrial fibrillation - Rate controlled on atenolol and diltiazem -  Continue telemetry monitoring   6. Wernicke's encephalopathy- Secondary to chronic alcohol use - IV thiamine supplementation per pharmacy protocol - Telesitter  7.Thrombocytopenia - Likely due to alcohol use now resolved - No signs of active bleeding or infection - Avoid antiplatelets or anticoagulation   All the records are reviewed and case discussed with Care Management/Social Worker. Management plans discussed with the patient, family and they are in agreement.  CODE  STATUS: Full Code  TOTAL TIME TAKING CARE OF THIS PATIENT: 36 minutes.   More than 50% of the time was spent in counseling/coordination of care: YES  POSSIBLE D/C IN 2 DAYS, DEPENDING ON CLINICAL CONDITION.   on 03/29/2019 at 1:32 PM  This patient was staffed with Dr. Atha Starks, Manuella Ghazi who personally evaluated patient, reviewed documentation and agreed with assessment and plan of care as above.  Rufina Falco, DNP, FNP-BC [facility] Nurse Practitioner   Between 7am to 6pm - Pager [phone]  After 6pm go to [email or web address] - Technical brewer [facility]  Office  [phone]  CC: Primary care physician; [name], MD  Note: This dictation was prepared with Dragon dictation along with smaller phrase technology. Any transcriptional errors that result from this process are unintentional.

## 2019-03-29 NOTE — Plan of Care (Signed)
  Problem: Nutrition: Goal: Adequate nutrition will be maintained Outcome: Not Progressing   Problem: Safety: Goal: Ability to remain free from injury will improve Outcome: Progressing

## 2019-03-29 NOTE — Progress Notes (Signed)
Patient ate 75 percent of dinner independently after setup with encouragement.   NP would like how the patient rests to be documented to determine medications needs/adjustments. Madlyn Frankel, RN

## 2019-03-29 NOTE — Progress Notes (Signed)
Nutrition Follow Up Note   DOCUMENTATION CODES:   Not applicable  INTERVENTION:   Magic cup TID with meals, each supplement provides 290 kcal and 9 grams of protein  MVI, thiamine and folic acid daily in setting of etoh abuse.   NUTRITION DIAGNOSIS:   Predicted suboptimal nutrient intake related to social / environmental circumstances(etoh abuse ) as evidenced by other (comment)(per chart review ).  GOAL:   Patient will meet greater than or equal to 90% of their needs  -progressing   MONITOR:   PO intake, Supplement acceptance, Labs, Weight trends, Skin, I & O's  ASSESSMENT:   68 y.o. male with a known history of hypertension, alcohol abuse who presented to the ED with right shoulder pain after a fall.  Right shoulder x-ray showed a fracture in the proximal humerus.    RD working remotely.  Pt with fair appetite and oral intake; pt eating 50-75% of meals. Pt receiving Magic Cups on meals trays. Per palliative care note, pt would not want a feeding tube. Per chart, pt with weight gain since admit; pt +10.4L on I & Os. RD will continue to monitor.   Pt previously diagnosed with moderate malnutrition. Pt at high risk for developing severe malnutrition but unable to diagnose at this time as NFPE cannot be performed.   Medications reviewed and include: aspirin, folic acid, MVI, paxil, thiamine, vitamin C  Labs reviewed: Na 134(L), K 3.5 wnl, creat 0.47(L) P 3.8 wnl, Mg 1.9 wnl- 5/22  Diet Order:   Diet Order            DIET - DYS 1 Room service appropriate? Yes; Fluid consistency: Honey Thick  Diet effective now             EDUCATION NEEDS:   No education needs have been identified at this time  Skin:  Skin Assessment: Reviewed RN Assessment  Last BM:  5/25- type 6  Height:   Ht Readings from Last 1 Encounters:  03/20/19 5' 2"  (1.575 m)    Weight:   Wt Readings from Last 1 Encounters:  03/28/19 61.7 kg    Ideal Body Weight:  53.6 kg  BMI:  Body mass  index is 24.87 kg/m.  Estimated Nutritional Needs:   Kcal:  1500-1700kcal/day   Protein:  75-85g/day   Fluid:  >1.3L/day   Koleen Distance MS, RD, LDN Pager #- [phone] Office#- [phone] After Hours Pager: [phone]

## 2019-03-30 MED ORDER — POTASSIUM CHLORIDE CRYS ER 20 MEQ PO TBCR
40.0000 meq | EXTENDED_RELEASE_TABLET | Freq: Once | ORAL | Status: AC
  Administered 2019-03-30: 40 meq via ORAL
  Filled 2019-03-30: qty 2

## 2019-03-30 NOTE — NC FL2 (Signed)
[place] LEVEL OF CARE SCREENING TOOL     IDENTIFICATION  Patient Name: Douglas Peterson. Birthdate: 10-Dec-1950 Sex: male Admission Date (Current Location): 03/19/2019  [place] and [place] Number:  Engineering geologist and Address:  [facility], [address] [place], [place] [address]      Provider Number: [id number]  Attending Physician Name and Address:  [name], MD  Relative Name and Phone Number:  JIRCVE,[phone] or Ronie, Barnhart Sister   [phone]     Current Level of Care: Hospital Recommended Level of Care: [place] Prior Approval Number:    Date Approved/Denied:   PASRR Number: [id number]  Discharge Plan: SNF    Current Diagnoses: Patient Active Problem List   Diagnosis Date Noted  . Goals of care, counseling/discussion   . Palliative care by specialist   . Alcohol withdrawal ([place]) 03/20/2019  . Low back pain 11/15/2018  . AMS (altered mental status) 10/12/2018  . Delirium tremens ([place]) 03/08/2018  . Malnutrition of moderate degree 11/24/2017  . HTN (hypertension) 09/16/2017  . Hypothermia 12/17/2016  . Alcohol use   . Diarrhea   . Hyponatremia 07/09/2015    Orientation RESPIRATION BLADDER Height & Weight     Self  Normal Incontinent Weight: 136 lb (61.7 kg) Height:  5' 2"  (157.5 cm)  BEHAVIORAL SYMPTOMS/MOOD NEUROLOGICAL BOWEL NUTRITION STATUS      Incontinent Diet(Dysphagia 1 diet)  AMBULATORY STATUS COMMUNICATION OF NEEDS Skin   Limited Assist Verbally Normal                       Personal Care Assistance Level of Assistance  Bathing, Feeding, Dressing Bathing Assistance: Limited assistance Feeding assistance: Independent Dressing Assistance: Limited assistance     Functional Limitations Info  Hearing, Sight, Speech Sight Info: Adequate Hearing Info: Adequate Speech Info: Adequate    SPECIAL CARE FACTORS FREQUENCY  PT  (By licensed PT), OT (By licensed OT), Speech therapy     PT Frequency: Minimum 5x a week OT Frequency: Minimum 5x a week     Speech Therapy Frequency: Minimum 2x a week      Contractures Contractures Info: Not present    Additional Factors Info  Code Status, Allergies, Psychotropic Code Status Info: DNR Allergies Info: No Known Allergies  Psychotropic Info: PARoxetine (PAXIL) tablet 20 mg and ziprasidone (GEODON) injection 10 mg          Current Medications (03/30/2019):  This is the current hospital active medication list Current Facility-Administered Medications  Medication Dose Route Frequency Provider Last Rate Last Dose  . acetaminophen (TYLENOL) tablet 650 mg  650 mg Oral Q6H PRN Sela Hua, MD   650 mg at 03/23/19 2037   Or  . acetaminophen (TYLENOL) suppository 650 mg  650 mg Rectal Q6H PRN Mayo, Pete Pelt, MD      . aspirin EC tablet 81 mg  81 mg Oral Daily Mayo, Pete Pelt, MD   81 mg at 03/30/19 0957  . baclofen (LIORESAL) tablet 10 mg  10 mg Oral TID Sela Hua, MD   10 mg at 03/30/19 1508  . diltiazem (CARDIZEM CD) 24 hr capsule 120 mg  120 mg Oral BID Sela Hua, MD   120 mg at 03/30/19 0957  . doxepin (SINEQUAN) capsule 10 mg  10 mg Oral QHS Cyndee Brightly [place], [place]   10 mg at 03/29/19 2113  . folic acid (FOLVITE) tablet 1 mg  1 mg Oral  Daily Mayo, Pete Pelt, MD   1 mg at 03/30/19 8264  . haloperidol lactate (HALDOL) injection 2 mg  2 mg Intramuscular Q4H PRN Dustin Flock, MD      . LORazepam (ATIVAN) injection 1 mg  1 mg Intramuscular Q4H PRN [name], MD      . metoprolol tartrate (LOPRESSOR) injection 5 mg  5 mg Intravenous Q6H PRN Mayo, Pete Pelt, MD      . morphine 2 MG/ML injection 2 mg  2 mg Intravenous Q4H PRN Lang Snow, NP   2 mg at 03/27/19 1107  . multivitamin with minerals tablet 1 tablet  1 tablet Oral Daily Mayo, Pete Pelt, MD   1 tablet at 03/30/19 [phone]  . ondansetron (ZOFRAN) tablet 4 mg  4 mg Oral Q6H PRN Mayo, Pete Pelt, MD   4 mg at 03/28/19 1819   Or  . ondansetron (ZOFRAN) injection 4 mg  4 mg Intravenous Q6H PRN Mayo, Pete Pelt, MD   4 mg at 03/27/19 1108  . oxyCODONE-acetaminophen (PERCOCET) 7.5-325 MG per tablet 1-2 tablet  1-2 tablet Oral Q6H PRN Saundra Shelling, MD   1 tablet at 03/30/19 1215  . PARoxetine (PAXIL) tablet 20 mg  20 mg Oral Daily Mayo, Pete Pelt, MD   20 mg at 03/30/19 0956  . polyethylene glycol (MIRALAX / GLYCOLAX) packet 17 g  17 g Oral Daily PRN Mayo, Pete Pelt, MD      . thiamine (VITAMIN B-1) tablet 100 mg  100 mg Oral Daily Pyreddy, Pavan, MD   100 mg at 03/30/19 0957   Or  . thiamine (B-1) injection 100 mg  100 mg Intravenous Daily Pyreddy, Pavan, MD      . vitamin C (ASCORBIC ACID) tablet 250 mg  250 mg Oral Daily Mayo, Pete Pelt, MD   250 mg at 03/30/19 0940  . ziprasidone (GEODON) injection 10 mg  10 mg Intramuscular Once Dustin Flock, MD         Discharge Medications: Please see discharge summary for a list of discharge medications.  Relevant Imaging Results:  Relevant Lab Results:   Additional Information SSN [id number]  Ross Ludwig, LCSW

## 2019-03-30 NOTE — Progress Notes (Signed)
Pt was in need of assistance from care team. Ch provided the pt with his bedside urinal and helped to adjust him the bed. Pt seemed to be in a emotionally distressed state. Ch provided comfort for pt as requested.  F/u with pt should include bedside companionship.    03/30/19 1500  Clinical Encounter Type  Visited With Patient  Visit Type Social support  Referral From Patient  Consult/Referral To Nurse  Stress Factors  Patient Stress Factors Health changes;Major life changes  Family Stress Factors None identified

## 2019-03-30 NOTE — TOC Initial Note (Signed)
Transition of Care (TOC) - Initial/Assessment Note    Patient Details  Name: Douglas Peterson. MRN: [id number] Date of Birth: 1951-02-05  Transition of Care [facility]) CM/SW Contact:    Ross Ludwig, LCSW Phone Number: 03/30/2019, 5:18 PM  Clinical Narrative:                  [name] a 68 year old male who is alert and oriented x1.  Patient has some confusion, CSW review patient's information via chart review to complete assessment.  Patient has history of drinking, he has a sister who has been a good support network for patient.  He also has a daughter who does not really visit him very often and is not very engaged.  Patient has been to rehab in the past, plan is for patient to return back home or possibly to a higher level of care.  Patient's daughter stated he has been to rehab in the past, and she did not express any other questions, she would like patient to go to SNF.     Expected Discharge Plan: Skilled Nursing Facility Barriers to Discharge: Continued Medical Work up   Patient Goals and CMS Choice Patient states their goals for this hospitalization and ongoing recovery are:: To return home after short term rehab. CMS Medicare.gov Compare Post Acute Care list provided to:: Patient Represenative (must comment) Choice offered to / list presented to : Adult Children  Expected Discharge Plan and Services Expected Discharge Plan: Skilled Nursing Facility In-house Referral: Clinical Social Work Discharge Planning Services: NA Post Acute Care Choice: [place] Living arrangements for the past 2 months: Apartment                 DME Arranged: N/A DME Agency: NA                  Prior Living Arrangements/Services Living arrangements for the past 2 months: Apartment Lives with:: Self Patient language and need for interpreter reviewed:: Yes        Need for Family Participation in Patient Care: Yes (Comment) Care giver support system in place?: No  (comment)   Criminal Activity/Legal Involvement Pertinent to Current Situation/Hospitalization: No - Comment as needed  Activities of Daily Living Home Assistive Devices/Equipment: Cane (specify quad or straight) ADL Screening (condition at time of admission) Patient's cognitive ability adequate to safely complete daily activities?: Yes Is the patient deaf or have difficulty hearing?: No Does the patient have difficulty seeing, even when wearing glasses/contacts?: No Does the patient have difficulty concentrating, remembering, or making decisions?: Yes Patient able to express need for assistance with ADLs?: Yes Does the patient have difficulty dressing or bathing?: No Independently performs ADLs?: Yes (appropriate for developmental age) Does the patient have difficulty walking or climbing stairs?: Yes Weakness of Legs: Both Weakness of Arms/Hands: Right  Permission Sought/Granted Permission sought to share information with : Case Manager, Customer service manager Permission granted to share information with : Yes, Verbal Permission Granted  Share Information with NAME: Mallon,[phone] or Uthman, Mrocz[place] Sister   [phone]   Permission granted to share info w AGENCY: SNF admissions        Emotional Assessment Appearance:: Appears stated age Attitude/Demeanor/Rapport: Screaming Affect (typically observed): Appropriate, Irritable, Stable Orientation: : Oriented to Self Alcohol / Substance Use: Alcohol Use Psych Involvement: No (comment)  Admission diagnosis:  Rapid atrial fibrillation ([place]) [I48.91] Alcohol withdrawal syndrome without complication ([place]) [S31.594] Other closed nondisplaced fracture of proximal end of right humerus,  initial encounter [S42.294A] Patient Active Problem List   Diagnosis Date Noted  . Goals of care, counseling/discussion   . Palliative care by specialist   . Alcohol withdrawal ([place]) 03/20/2019   . Low back pain 11/15/2018  . AMS (altered mental status) 10/12/2018  . Delirium tremens ([place]) 03/08/2018  . Malnutrition of moderate degree 11/24/2017  . HTN (hypertension) 09/16/2017  . Hypothermia 12/17/2016  . Alcohol use   . Diarrhea   . Hyponatremia 07/09/2015   PCP:  [name], MD Pharmacy:   [place], [place] - [place] [address] [place] [address] Phone: [phone] Fax: [phone]     Social Determinants of Health (SDOH) Interventions    Readmission Risk Interventions No flowsheet data found.

## 2019-03-30 NOTE — Progress Notes (Signed)
[place] [facility] [place] NAME: Douglas Peterson    MR#:  [id number]  DATE OF BIRTH:  04-16-1951  SUBJECTIVE:   Chief Complaint  Patient presents with  . Shoulder Injury   Patient mental status improving.  Awake and alert this morning asked for a urinal and was able to use it without assistance.  Nursing staff patient has been less agitated/restless.  Sitter discontinued.  REVIEW OF SYSTEMS:  Unable to obtain due to altered mental status  DRUG ALLERGIES:  No Known Allergies VITALS:  Blood pressure 107/68, pulse 61, temperature 97.8 F (36.6 C), temperature source Oral, resp. rate 18, height 5' 2"  (1.575 m), weight 61.7 kg, SpO2 94 %. PHYSICAL EXAMINATION:   GENERAL:  68 y.o.-year-old patient lying in the bed with sitter at the bedside. In no acute distress. EYES: Pupils equal, round, reactive to light and accommodation. No scleral icterus. Extraocular muscles intact.  HEENT: Head atraumatic, normocephalic. Oropharynx and nasopharynx clear.  NECK:  Supple, no jugular venous distention. No thyroid enlargement, no tenderness.  LUNGS: Normal breath sounds bilaterally, no wheezing, rales,rhonchi or crepitation. No use of accessory muscles of respiration.  CARDIOVASCULAR: S1, S2 normal. No murmurs, rubs, or gallops.  ABDOMEN: Soft, nontender, nondistended. Bowel sounds present. No organomegaly or mass.  EXTREMITIES: No pedal edema, cyanosis, or clubbing.  NEUROLOGIC: Cranial nerves II through XII are intact. Muscle strength 5/5 in all extremities. Sensation intact. Gait not checked.  PSYCHIATRIC: The patient is alert and oriented to  SKIN: No obvious rash, lesion, or ulcer.   DATA REVIEWED:  LABORATORY PANEL:  Male CBC Recent Labs  Lab 03/28/19 0913  WBC 6.0  HGB 11.9*  HCT 35.1*  PLT 223   ------------------------------------------------------------------------------------------------------------------ Chemistries  Recent Labs  Lab  03/26/19 0716  03/28/19 0913  NA  --    < > 134*  K 4.0   < > 3.5  CL  --    < > 100  CO2  --    < > 24  GLUCOSE  --    < > 104*  BUN  --    < > 8  CREATININE  --    < > 0.47*  CALCIUM  --    < > 9.0  MG 1.9  --   --    < > = values in this interval not displayed.   RADIOLOGY:  No results found. ASSESSMENT AND PLAN:   68 y.o. 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  Right shoulder x-ray showed fracture in the proximal humerus.  Now noted to be in withdrawal and altered.  1. Acute  EtOH Withdrawal - Improving - Daily BMP+Mg normal thus far - Continue Daily Thiamine, Folate, MVI  PO.  - Sleep improved with Doxepin - SW consult for cessation resources - PT recommends SNF - CIWA +/- Standing Protocol. Decrease Ativan as not scoring high - Seizure precautions  2. Right humerus fracture - post traumatic fall due to alcohol intoxication. Patient with hx of recurrent falls due to intoxication - Sling immobilizer in place - PRN pain managment - Orthopedic surgery consult appreciated, no acute intervention recommended - PT/OT consult as above  3. Hyponatremia - Likely secondary to beer potomania - Improved  4. Hypokalemia - Improved with IV replacement - Continue to monitor  5. History of paroxysmal atrial fibrillation - Rate  controlled on atenolol and diltiazem - Continue telemetry monitoring   6. Wernicke's encephalopathy- Secondary to chronic alcohol use. Mental status improved. No less agitated/restless - IV thiamine supplementation per pharmacy protocol - Not requiring a sitter - Poor po intake, Nutritionist following  7.Thrombocytopenia - Likely due to alcohol use now resolved - No signs of active bleeding or infection - Avoid antiplatelets or anticoagulation  Pending placement to SNF  All the  records are reviewed and case discussed with Care Management/Social Worker. Management plans discussed with the patient, family and they are in agreement.  CODE STATUS: DNR  TOTAL TIME TAKING CARE OF THIS PATIENT: 37 minutes.   More than 50% of the time was spent in counseling/coordination of care: YES  POSSIBLE D/C IN 2 DAYS, DEPENDING ON CLINICAL CONDITION.   on 03/30/2019 at 12:37 PM  This patient was staffed with Dr. Atha Starks, Manuella Ghazi who personally evaluated patient, reviewed documentation and agreed with assessment and plan of care as above.  Rufina Falco, DNP, FNP-BC [facility] Nurse Practitioner   Between 7am to 6pm - Pager [phone]  After 6pm go to [email or web address] - Technical brewer [facility]  Office  [phone]  CC: Primary care physician; [name], MD  Note: This dictation was prepared with Dragon dictation along with smaller phrase technology. Any transcriptional errors that result from this process are unintentional.

## 2019-03-30 NOTE — Progress Notes (Signed)
Speech Therapy Note: chart reviewed; consulted NSG re: pt's status. Per MD notes, pt's MS is improving. He is currently on a dysphagia diet; pt has a h/o dysphagia per MBSS in recent past.  Recommend a repeat MBSS tomorrow to better assess oropharyngeal swallowing for appropriate, least restrictive diet consistency. ST services will f/u tomorrow w/ such.    Douglas Peterson, [place], CCC-SLP
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
## 2019-03-30 NOTE — Plan of Care (Signed)
  Problem: Education: Goal: Knowledge of General Education information will improve Description Including pain rating scale, medication(s)/side effects and non-pharmacologic comfort measures Outcome: Progressing Note:  Patient alert and oriented x4 today, very compliant and following commands.

## 2019-03-31 ENCOUNTER — Inpatient Hospital Stay: Payer: Medicare HMO

## 2019-03-31 LAB — POTASSIUM: Potassium: 4.3 mmol/L (ref 3.5–5.1)
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
## 2019-03-31 NOTE — Care Management Important Message (Signed)
Important Message  Patient Details  Name: Douglas Peterson. MRN: [id number] Date of Birth: 23-Nov-1950   Medicare Important Message Given:  Yes  Daughter returned call.  Aware of Medicare IM appeal right.    [name] 03/31/2019, 2:10 PM

## 2019-03-31 NOTE — Progress Notes (Signed)
[facility] [place] NAME: Douglas Peterson    MR#:  [id number]  DATE OF BIRTH:  12/15/1950  SUBJECTIVE:   Chief Complaint  Patient presents with  . Shoulder Injury   Patient mental status continues to improve. Patient still with intermittent confusion but he is easily re-oriented. No reports of agitation, restlessness or combativeness.  REVIEW OF SYSTEMS:  Unable to obtain due to altered mental status  DRUG ALLERGIES:  No Known Allergies VITALS:  Blood pressure 129/73, pulse 63, temperature 98 F (36.7 C), temperature source Oral, resp. rate 18, height 5' 2"  (1.575 m), weight 61.7 kg, SpO2 98 %. PHYSICAL EXAMINATION:   GENERAL:  68 y.o.-year-old patient lying in the bed with sitter at the bedside. In no acute distress. EYES: Pupils equal, round, reactive to light and accommodation. No scleral icterus. Extraocular muscles intact.  HEENT: Head atraumatic, normocephalic. Oropharynx and nasopharynx clear.  NECK:  Supple, no jugular venous distention. No thyroid enlargement, no tenderness.  LUNGS: Normal breath sounds bilaterally, no wheezing, rales,rhonchi or crepitation. No use of accessory muscles of respiration.  CARDIOVASCULAR: S1, S2 normal. No murmurs, rubs, or gallops.  ABDOMEN: Soft, nontender, nondistended. Bowel sounds present. No organomegaly or mass.  EXTREMITIES: No pedal edema, cyanosis, or clubbing.  NEUROLOGIC: Cranial nerves II through XII are intact. Muscle strength 5/5 in all extremities. Sensation intact. Gait not checked.  PSYCHIATRIC: The patient is alert and oriented to  SKIN: No obvious rash, lesion, or ulcer.   DATA REVIEWED:  LABORATORY PANEL:  Male CBC Recent Labs  Lab 03/28/19 0913  WBC 6.0  HGB 11.9*  HCT 35.1*  PLT 223   ------------------------------------------------------------------------------------------------------------------ Chemistries  Recent Labs  Lab 03/26/19 0716  03/28/19 0913  03/31/19 0508  NA  --    < > 134*  --   K 4.0   < > 3.5 4.3  CL  --    < > 100  --   CO2  --    < > 24  --   GLUCOSE  --    < > 104*  --   BUN  --    < > 8  --   CREATININE  --    < > 0.47*  --   CALCIUM  --    < > 9.0  --   MG 1.9  --   --   --    < > = values in this interval not displayed.   RADIOLOGY:  No results found. ASSESSMENT AND PLAN:   68 y.o. 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  Right shoulder x-ray showed fracture in the proximal humerus.  Now noted to be in withdrawal and altered.  1. Acute  EtOH Withdrawal - Improving - Continue Daily Thiamine, Folate, MVI  PO.  - Sleep improved with Doxepin - SW consult for cessation resources - PT recommends SNF - CIWA +/- Standing Protocol discontinued  2. Right humerus fracture - post traumatic fall due to alcohol intoxication. Patient with hx of recurrent falls due to intoxication - Sling immobilizer in place - PRN pain managment - Orthopedic surgery consult appreciated, no acute intervention recommended - PT/OT as above  3. Hyponatremia - Likely secondary to beer potomania - Improved  4. Hypokalemia - Improved with IV replacement - Continue to monitor  5. History of paroxysmal atrial fibrillation -  Rate controlled on atenolol and diltiazem - Continue telemetry monitoring   6. Wernicke's encephalopathy- Secondary to chronic alcohol use. Mental status improved. No less agitated/restless - IV thiamine supplementation per pharmacy protocol - Not requiring a sitter - Poor po intake, Nutritionist following  7.Thrombocytopenia - Likely due to alcohol use now resolved - No signs of active bleeding or infection - Avoid antiplatelets or anticoagulation  Pending placement to SNF  All the records are reviewed and case discussed with Care  Management/Social Worker. Management plans discussed with the patient, family and they are in agreement.  CODE STATUS: DNR  TOTAL TIME TAKING CARE OF THIS PATIENT: 37 minutes.   More than 50% of the time was spent in counseling/coordination of care: YES  POSSIBLE D/C IN 2 DAYS, DEPENDING ON CLINICAL CONDITION.   on 03/31/2019 at 1:58 PM  This patient was staffed with Dr. Vianne Bulls, Lise Auer who personally evaluated patient, reviewed documentation and agreed with assessment and plan of care as above.  Rufina Falco, DNP, FNP-BC [facility] Nurse Practitioner   Between 7am to 6pm - Pager [phone]  After 6pm go to [email or web address] - [employer] [facility]  Office  [phone]  CC: Primary care physician; [name], MD  Note: This dictation was prepared with Dragon dictation along with smaller phrase technology. Any transcriptional errors that result from this process are unintentional.

## 2019-03-31 NOTE — Progress Notes (Signed)
COVID-19 test has not been done on admission, ordered.

## 2019-03-31 NOTE — Care Management Important Message (Signed)
Important Message  Patient Details  Name: Douglas Peterson. MRN: [id number] Date of Birth: 1951/05/12   Medicare Important Message Given:  Other (see comment)  Attempted to contact [name], daughter, at [phone] to review Medicare IM.  Left message encouraging callback.   [name] 03/31/2019, 1:48 PM

## 2019-03-31 NOTE — Progress Notes (Signed)
Dinner was his first Dysphagia III meal.  He ate most of the entree and then choked so badly that he had trouble breathing.  He is too weak to quickly clear his airway.  After several minutes he was breathing easily.  I'm changing him back to his former diet until speech can see him again in the morning.

## 2019-04-01 MED ORDER — THIAMINE HCL 100 MG PO TABS: 100.0000 mg | ORAL_TABLET | Freq: Every day | ORAL | 0 refills | Status: DC

## 2019-04-01 MED ORDER — ASCORBIC ACID 250 MG PO TABS: 250.0000 mg | ORAL_TABLET | Freq: Every day | ORAL | 0 refills | Status: DC

## 2019-04-01 MED ORDER — OXYCODONE-ACETAMINOPHEN 7.5-325 MG PO TABS: 1.0000 | ORAL_TABLET | Freq: Four times a day (QID) | ORAL | 0 refills | Status: DC | PRN

## 2019-04-01 MED ORDER — DILTIAZEM HCL ER COATED BEADS 120 MG PO CP24: 120.0000 mg | ORAL_CAPSULE | Freq: Every day | ORAL | 0 refills | Status: DC

## 2019-04-01 MED ORDER — OMEPRAZOLE 40 MG PO CPDR: 40.0000 mg | DELAYED_RELEASE_CAPSULE | Freq: Every day | ORAL | 0 refills | Status: DC

## 2019-04-01 MED ORDER — FOLIC ACID 1 MG PO TABS: 1.0000 mg | ORAL_TABLET | Freq: Every day | ORAL | 0 refills | Status: DC

## 2019-04-01 MED ORDER — DOXEPIN HCL 10 MG PO CAPS: 10.0000 mg | ORAL_CAPSULE | Freq: Every day | ORAL | 0 refills | Status: DC

## 2019-04-01 MED ORDER — ADULT MULTIVITAMIN W/MINERALS CH: 1.0000 | ORAL_TABLET | Freq: Every day | ORAL | 0 refills | Status: DC

## 2019-04-01 MED ORDER — ONDANSETRON HCL 4 MG PO TABS: 4.0000 mg | ORAL_TABLET | Freq: Four times a day (QID) | ORAL | 0 refills | Status: DC | PRN

## 2019-04-01 NOTE — Plan of Care (Signed)
  Problem: Activity: Goal: Risk for activity intolerance will decrease Outcome: Progressing   Problem: Health Behavior/Discharge Planning: Goal: Ability to manage health-related needs will improve Outcome: Not Progressing Note:  PATIENT will require assistance at discharge. Patient has intermittent confusion.   Problem: Clinical Measurements: Goal: Ability to maintain clinical measurements within normal limits will improve Outcome: Not Progressing

## 2019-04-01 NOTE — Progress Notes (Signed)
Report called to [facility], [place].  EMS called for transport.
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
## 2019-04-01 NOTE — TOC Transition Note (Signed)
Transition of Care [facility]) - CM/SW Discharge Note   Patient Details  Name: Douglas Peterson. MRN: [id number] Date of Birth: [birth year]  [facility] CM/SW Contact:  Ross Ludwig, LCSW Phone Number: 04/01/2019, 6:40 PM   Clinical Narrative:     CSW presented bed offers to patient and his daughter, he chose [facility] [place].  CSW contacted [facility] and they can accept patient today.  Patient was alert and oriented x3, he could not remember what brought him to the hospital, CSW informed him he had a fall.  Patient was seen by PT and they recommended SNF for short term rehab.  Palliative has also seen patient and are recommending palliative follow him at [facility].  CSW contacted [facility] to have palliative follow him.  Patient to be d/c'ed today to [facility] [place].  Patient and family agreeable to plans will transport via ems RN to call report.      Final next level of care: Skilled Nursing Facility Barriers to Discharge: Barriers Resolved   Patient Goals and CMS Choice Patient states their goals for this hospitalization and ongoing recovery are:: To return home after short term rehab. CMS Medicare.gov Compare Post Acute Care list provided to:: Patient Represenative (must comment) Choice offered to / list presented to : Adult Children  Discharge Placement   Existing PASRR number confirmed : 03/30/19          Patient chooses bed at: [facility] [place] Patient to be transferred to facility by: [facility] Name of family member notified: Patient's daughter [name] [phone] Patient and family notified of of transfer: 04/01/19  Discharge Plan and Services In-house Referral: Clinical Social Work Discharge Planning Services: NA Post Acute Care Choice: [place]          DME Arranged: N/A DME Agency: NA                  Social Determinants of Health (SDOH) Interventions     Readmission Risk  Interventions No flowsheet data found.

## 2019-04-01 NOTE — Progress Notes (Signed)
Attempted to get patient up to the stair.  He stands with difficulty but cannot balance himself once he is standing.  See PT note of 5/27.

## 2019-04-01 NOTE — Progress Notes (Signed)
New referral for outpatient Palliative to follow at [facility] received from [place]. Patient to discharge today. Patient information faxed to referral.  Flo Shanks BSN, RN, [place] [phone]

## 2019-04-01 NOTE — Progress Notes (Signed)
Called patient's daughter.  She was aware of the transfer to [facility].  Gave her information for [facility].

## 2019-04-01 NOTE — Progress Notes (Signed)
Discharged to [facility] via EMS,

## 2019-04-01 NOTE — Progress Notes (Signed)
Speech Language Pathology Treatment: Dysphagia  Patient Details Name: Douglas Peterson. MRN: [id number] DOB: Sep 29, 1951 Today's Date: 04/01/2019 Time: 5885-0277 SLP Time Calculation (min) (ACUTE ONLY): 60 min  Assessment / Plan / Recommendation Clinical Impression  Pt seen for ongoing assessment of swallowing and toleration of recommended Dysphagia diet yesterday post MBSS. Pt has a h/o Dysphagia: MBSS on 5/27/202 revealed delayed pharyngeal swallow initiation w/ all consistencies w/ pharyngeal residue post swallow; strategies aided in reducing residue. Esophageal dysmotility was noted. On 03/11/2018, MBSS revealed similar but then SILENT ASPIRATION and pharyngeal swallow delay, moderate pharyngeal residue, AND slow movement thru the upper Esophagus. He also has declined Cognitive status w/ ongoing Confusion requiring cues for follow through w/ tasks. He is weak and in ETOH withdrawal this admission. He is missing many dentition, molars for full mastication.  Post the MBSS yesterday, a trial upgrade of diet was given of mech soft w/ thin liquids. Per NSG report, he began coughing/choking while eating the "entree". Unsure of the exact foods. Pt was feeding himself. MD placed pt back on the previous dysphagia diet of puree/Honey consistency liquids until reassessment by ST services.  Due to pt's presentation on the MBSS this time but in light of both Cognitive/Medical decline, a trial diet of Dysphagia level 2(MINCED foods) and Nectar consistency liquids was given as Lunch meal today.  Pt was awake, needed positioning in bed. Confusion noted during engagement w/ him; he required information to be repeated often. Noted he is only able to use his LUE for self-feeding d/t RUE in sling(injury to shoulder per admission). He required tray setup, then fed self w/ support w/ verbal/tactile cues given during session to follow aspiration precautions. He consumed trials of Nectar liquids via cup/straw then  purees/Minced foods w/ No overt, clinical s/s of aspiration; no decline in vocal quality or respiratory status noted during/post trials. He finished the meal fairly timely but was encouraged to Slow Down to allow for Esophageal clearing (d/t dysmotility history and risk for Regurgitation). NSG reported good toleration of Pills in Puree today.  In light of pt's Cognitive decline and baseline oropharyngeal phase dysphagia and Esophageal phase dysphagia, recommend a Dysphagia level 2 (MINCED foods d/t missing Dentition) w/ NECTAR consistency liquids; strict aspiration and REFLUX precautions; Pills in Puree. Monitoring at ALL meals d/t Declined Cognitive status for SMALL bites/sips SLOWLY - must alternate foods/liquids and take rest breaks to allow for Esophageal and pharyngeal clearing. Recommend f/u w/ GI for further assessment/management of Esophageal dysmotility as ANY regurgitation/reflux can increase risk for aspiration of Reflux material thus pulmonary decline. ST services will f/u to monitor pt's status and toleration of diet; education w/ pt/family as needed.    HPI HPI: Pt is a 68 y.o. male with a known history of hypertension, alcohol abuse who presented to the ED with right shoulder pain after a fall.  Right shoulder x-ray showed a fracture in the proximal humerus.  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  This morning, patient became shaky and began to develop tachycardia.  He was felt to be withdrawing from alcohol.  In the ED, he was tachycardic with heart rates up to the 130s.  He was given a dose of IV diltiazem, in addition to his home p.o. diltiazem and his heart rates continued to be elevated.  He was also treated with IV Ativan for alcohol withdrawal,  but he continued to be shaky.  Hospitalists were called for admission.  BSE on 03/21/19 revealed: presents for BSE today w/hx of  oropharyngeal dysphagia and significant EtOH abuse, last MBSS on 03/11/2018 revealed SILENT ASPIRATION of nectar thick liquids by TSP, swallow delay, poor laryngeal excursion w/poor epiglottic inversion for airway protection, moderate pharyngeal residue which was largely not decreased with f/u swallows, and slow movement thru the upper Esophagus. Honey Thick by tsp and puree by 1/2 tsp appeared safer, with swallow triggering at the valleculae and no laryngeal penetration or aspiration.       SLP Plan  Continue with current plan of care       Recommendations  Diet recommendations: Dysphagia 2 (fine chop);Nectar-thick liquid Liquids provided via: Cup;Straw Medication Administration: Whole meds with puree(crushed in puree as needed) Supervision: Patient able to self feed;Intermittent supervision to cue for compensatory strategies(assist as needed) Compensations: Minimize environmental distractions;Slow rate;Small sips/bites;Lingual sweep for clearance of pocketing;Multiple dry swallows after each bite/sip;Follow solids with liquid Postural Changes and/or Swallow Maneuvers: Seated upright 90 degrees;Upright 30-60 min after meal                General recommendations: (Dietician f/u) Oral Care Recommendations: Oral care BID;Staff/trained caregiver to provide oral care Follow up Recommendations: Skilled Nursing facility SLP Visit Diagnosis: Dysphagia, oropharyngeal phase (R13.12)(per MBSS on 03/31/2019) Plan: Continue with current plan of care       [place], [place], [place] 04/01/2019, 2:30 PM

## 2019-04-11 LAB — NOVEL CORONAVIRUS, NAA (HOSP ORDER, SEND-OUT TO REF LAB; TAT 18-24 HRS): SARS-CoV-2, NAA: NOT DETECTED

## 2019-06-12 ENCOUNTER — Inpatient Hospital Stay
Admission: EM | Admit: 2019-06-12 | Discharge: 2019-06-21 | DRG: 897 | Disposition: A | Discharge status: Home Health | Payer: Medicare HMO | Attending: Internal Medicine | Admitting: Internal Medicine

## 2019-06-12 ENCOUNTER — Other Ambulatory Visit: Payer: Self-pay

## 2019-06-12 ENCOUNTER — Emergency Department: Payer: Medicare HMO

## 2019-06-12 DIAGNOSIS — R633 Feeding difficulties: Secondary | ICD-10-CM | POA: Diagnosis present

## 2019-06-12 DIAGNOSIS — Z20828 Contact with and (suspected) exposure to other viral communicable diseases: Secondary | ICD-10-CM | POA: Diagnosis present

## 2019-06-12 DIAGNOSIS — K2970 Gastritis, unspecified, without bleeding: Secondary | ICD-10-CM | POA: Diagnosis present

## 2019-06-12 DIAGNOSIS — M25511 Pain in right shoulder: Secondary | ICD-10-CM

## 2019-06-12 DIAGNOSIS — Z79899 Other long term (current) drug therapy: Secondary | ICD-10-CM

## 2019-06-12 DIAGNOSIS — F10239 Alcohol dependence with withdrawal, unspecified: Secondary | ICD-10-CM | POA: Diagnosis not present

## 2019-06-12 DIAGNOSIS — K219 Gastro-esophageal reflux disease without esophagitis: Secondary | ICD-10-CM | POA: Diagnosis present

## 2019-06-12 DIAGNOSIS — M62838 Other muscle spasm: Secondary | ICD-10-CM | POA: Diagnosis present

## 2019-06-12 DIAGNOSIS — I10 Essential (primary) hypertension: Secondary | ICD-10-CM | POA: Diagnosis present

## 2019-06-12 DIAGNOSIS — D6959 Other secondary thrombocytopenia: Secondary | ICD-10-CM | POA: Diagnosis present

## 2019-06-12 DIAGNOSIS — D72829 Elevated white blood cell count, unspecified: Secondary | ICD-10-CM | POA: Diagnosis present

## 2019-06-12 DIAGNOSIS — Z8249 Family history of ischemic heart disease and other diseases of the circulatory system: Secondary | ICD-10-CM | POA: Diagnosis not present

## 2019-06-12 DIAGNOSIS — M545 Low back pain: Secondary | ICD-10-CM | POA: Diagnosis present

## 2019-06-12 DIAGNOSIS — R112 Nausea with vomiting, unspecified: Secondary | ICD-10-CM | POA: Diagnosis present

## 2019-06-12 DIAGNOSIS — G8929 Other chronic pain: Secondary | ICD-10-CM | POA: Diagnosis present

## 2019-06-12 DIAGNOSIS — Z87891 Personal history of nicotine dependence: Secondary | ICD-10-CM

## 2019-06-12 DIAGNOSIS — F10939 Alcohol use, unspecified with withdrawal, unspecified: Secondary | ICD-10-CM

## 2019-06-12 LAB — CBC WITH DIFFERENTIAL/PLATELET
Abs Immature Granulocytes: 0.08 10*3/uL — ABNORMAL HIGH (ref 0.00–0.07)
Basophils Absolute: 0.1 10*3/uL (ref 0.0–0.1)
Basophils Relative: 0 %
Eosinophils Absolute: 0 10*3/uL (ref 0.0–0.5)
Eosinophils Relative: 0 %
HCT: 46 % (ref 39.0–52.0)
Hemoglobin: 15.6 g/dL (ref 13.0–17.0)
Immature Granulocytes: 1 %
Lymphocytes Relative: 31 %
Lymphs Abs: 5 10*3/uL — ABNORMAL HIGH (ref 0.7–4.0)
MCH: 30.7 pg (ref 26.0–34.0)
MCHC: 33.9 g/dL (ref 30.0–36.0)
MCV: 90.6 fL (ref 80.0–100.0)
Monocytes Absolute: 0.7 10*3/uL (ref 0.1–1.0)
Monocytes Relative: 4 %
Neutro Abs: 10.5 10*3/uL — ABNORMAL HIGH (ref 1.7–7.7)
Neutrophils Relative %: 64 %
Platelets: 137 10*3/uL — ABNORMAL LOW (ref 150–400)
RBC: 5.08 MIL/uL (ref 4.22–5.81)
RDW: 11.9 % (ref 11.5–15.5)
WBC: 16.3 10*3/uL — ABNORMAL HIGH (ref 4.0–10.5)
nRBC: 0 % (ref 0.0–0.2)

## 2019-06-12 LAB — URINE DRUG SCREEN, QUALITATIVE (ARMC ONLY)
Amphetamines, Ur Screen: NOT DETECTED
Barbiturates, Ur Screen: NOT DETECTED
Benzodiazepine, Ur Scrn: POSITIVE — AB
Cannabinoid 50 Ng, Ur ~~LOC~~: NOT DETECTED
Cocaine Metabolite,Ur ~~LOC~~: NOT DETECTED
MDMA (Ecstasy)Ur Screen: NOT DETECTED
Methadone Scn, Ur: NOT DETECTED
Opiate, Ur Screen: POSITIVE — AB
Phencyclidine (PCP) Ur S: NOT DETECTED
Tricyclic, Ur Screen: NOT DETECTED

## 2019-06-12 LAB — MAGNESIUM: Magnesium: 1.6 mg/dL — ABNORMAL LOW (ref 1.7–2.4)

## 2019-06-12 LAB — URINALYSIS, COMPLETE (UACMP) WITH MICROSCOPIC
Bilirubin Urine: NEGATIVE
Glucose, UA: NEGATIVE mg/dL
Hgb urine dipstick: NEGATIVE
Ketones, ur: NEGATIVE mg/dL
Leukocytes,Ua: NEGATIVE
Nitrite: NEGATIVE
Protein, ur: 100 mg/dL — AB
Specific Gravity, Urine: 1.013 (ref 1.005–1.030)
pH: 7 (ref 5.0–8.0)

## 2019-06-12 LAB — COMPREHENSIVE METABOLIC PANEL
ALT: 28 U/L (ref 0–44)
AST: 41 U/L (ref 15–41)
Albumin: 4.9 g/dL (ref 3.5–5.0)
Alkaline Phosphatase: 141 U/L — ABNORMAL HIGH (ref 38–126)
Anion gap: 16 — ABNORMAL HIGH (ref 5–15)
BUN: 6 mg/dL — ABNORMAL LOW (ref 8–23)
CO2: 26 mmol/L (ref 22–32)
Calcium: 9.1 mg/dL (ref 8.9–10.3)
Chloride: 96 mmol/L — ABNORMAL LOW (ref 98–111)
Creatinine, Ser: 0.46 mg/dL — ABNORMAL LOW (ref 0.61–1.24)
GFR calc Af Amer: >60 mL/min (ref >60)
GFR calc non Af Amer: >60 mL/min (ref >60)
Glucose, Bld: 120 mg/dL — ABNORMAL HIGH (ref 70–99)
Potassium: 3.3 mmol/L — ABNORMAL LOW (ref 3.5–5.1)
Sodium: 138 mmol/L (ref 135–145)
Total Bilirubin: 0.7 mg/dL (ref 0.3–1.2)
Total Protein: 8.1 g/dL (ref 6.5–8.1)

## 2019-06-12 LAB — AMMONIA: Ammonia: 25 umol/L (ref 9–35)

## 2019-06-12 LAB — ETHANOL: Alcohol, Ethyl (B): 11 mg/dL — ABNORMAL HIGH (ref <10)

## 2019-06-12 LAB — SARS CORONAVIRUS 2 BY RT PCR (HOSPITAL ORDER, PERFORMED IN ~~LOC~~ HOSPITAL LAB): SARS Coronavirus 2: NEGATIVE

## 2019-06-12 LAB — LIPASE, BLOOD: Lipase: 30 U/L (ref 11–51)

## 2019-06-12 MED ORDER — THIAMINE HCL 100 MG/ML IJ SOLN: 100.0000 mg | Freq: Every day | INTRAMUSCULAR | Status: DC

## 2019-06-12 MED ORDER — POTASSIUM CHLORIDE CRYS ER 20 MEQ PO TBCR
20.0000 meq | EXTENDED_RELEASE_TABLET | Freq: Once | ORAL | Status: AC
  Administered 2019-06-13: 20 meq via ORAL
  Filled 2019-06-12: qty 1

## 2019-06-12 MED ORDER — POTASSIUM CHLORIDE IN NACL 40-0.9 MEQ/L-% IV SOLN: INTRAVENOUS | Status: DC

## 2019-06-12 MED ORDER — LORAZEPAM 2 MG/ML IJ SOLN
2.0000 mg | Freq: Once | INTRAMUSCULAR | Status: AC
  Administered 2019-06-12: 2 mg via INTRAVENOUS
  Filled 2019-06-12: qty 1

## 2019-06-12 MED ORDER — POTASSIUM CHLORIDE IN NACL 40-0.9 MEQ/L-% IV SOLN
INTRAVENOUS | Status: DC
  Filled 2019-06-12: qty 1000

## 2019-06-12 MED ORDER — LORAZEPAM 2 MG/ML IJ SOLN
0.0000 mg | Freq: Two times a day (BID) | INTRAMUSCULAR | Status: AC
  Administered 2019-06-15 (×2): 2 mg via INTRAVENOUS
  Filled 2019-06-12 (×3): qty 1

## 2019-06-12 MED ORDER — THIAMINE HCL 100 MG/ML IJ SOLN
100.0000 mg | Freq: Every day | INTRAMUSCULAR | Status: DC
  Administered 2019-06-13: 09:00:00 100 mg via INTRAVENOUS
  Filled 2019-06-12 (×2): qty 2

## 2019-06-12 MED ORDER — LORAZEPAM 2 MG PO TABS: 0.0000 mg | ORAL_TABLET | Freq: Four times a day (QID) | ORAL | Status: DC

## 2019-06-12 MED ORDER — ADULT MULTIVITAMIN W/MINERALS CH
1.0000 | ORAL_TABLET | Freq: Every day | ORAL | Status: DC
  Administered 2019-06-13 – 2019-06-21 (×9): 1 via ORAL
  Filled 2019-06-12 (×9): qty 1

## 2019-06-12 MED ORDER — LORAZEPAM 2 MG/ML IJ SOLN: 0.0000 mg | Freq: Two times a day (BID) | INTRAMUSCULAR | Status: DC

## 2019-06-12 MED ORDER — PANTOPRAZOLE SODIUM 40 MG IV SOLR
40.0000 mg | Freq: Two times a day (BID) | INTRAVENOUS | Status: DC
  Administered 2019-06-12 – 2019-06-13 (×2): 40 mg via INTRAVENOUS
  Filled 2019-06-12 (×2): qty 40

## 2019-06-12 MED ORDER — SODIUM CHLORIDE 0.9 % IV BOLUS
1000.0000 mL | Freq: Once | INTRAVENOUS | Status: AC
  Administered 2019-06-12: 17:00:00 1000 mL via INTRAVENOUS

## 2019-06-12 MED ORDER — ENOXAPARIN SODIUM 40 MG/0.4ML ~~LOC~~ SOLN
40.0000 mg | SUBCUTANEOUS | Status: DC
  Administered 2019-06-12: 40 mg via SUBCUTANEOUS
  Filled 2019-06-12: qty 0.4

## 2019-06-12 MED ORDER — MAGNESIUM SULFATE 2 GM/50ML IV SOLN
2.0000 g | Freq: Once | INTRAVENOUS | Status: AC
  Administered 2019-06-12: 2 g via INTRAVENOUS
  Filled 2019-06-12: qty 50

## 2019-06-12 MED ORDER — LORAZEPAM 2 MG/ML IJ SOLN
0.0000 mg | Freq: Four times a day (QID) | INTRAMUSCULAR | Status: DC
  Administered 2019-06-12: 2 mg via INTRAVENOUS
  Filled 2019-06-12: qty 1

## 2019-06-12 MED ORDER — VITAMIN B-1 100 MG PO TABS
100.0000 mg | ORAL_TABLET | Freq: Every day | ORAL | Status: DC
  Administered 2019-06-14 – 2019-06-21 (×8): 100 mg via ORAL
  Filled 2019-06-12 (×8): qty 1

## 2019-06-12 MED ORDER — THIAMINE HCL 100 MG/ML IJ SOLN
100.0000 mg | Freq: Once | INTRAMUSCULAR | Status: AC
  Administered 2019-06-12: 16:00:00 100 mg via INTRAVENOUS
  Filled 2019-06-12: qty 2

## 2019-06-12 MED ORDER — LORAZEPAM 2 MG/ML IJ SOLN
0.0000 mg | Freq: Four times a day (QID) | INTRAMUSCULAR | Status: AC
  Administered 2019-06-12: 1 mg via INTRAVENOUS
  Administered 2019-06-13 – 2019-06-14 (×4): 2 mg via INTRAVENOUS
  Filled 2019-06-12 (×5): qty 1

## 2019-06-12 MED ORDER — ACETAMINOPHEN 325 MG PO TABS
650.0000 mg | ORAL_TABLET | Freq: Four times a day (QID) | ORAL | Status: DC | PRN
  Administered 2019-06-13 – 2019-06-16 (×2): 650 mg via ORAL
  Filled 2019-06-12 (×2): qty 2

## 2019-06-12 MED ORDER — LORAZEPAM 2 MG/ML IJ SOLN
2.0000 mg | Freq: Once | INTRAMUSCULAR | Status: AC
  Administered 2019-06-12: 17:00:00 2 mg via INTRAVENOUS
  Filled 2019-06-12: qty 1

## 2019-06-12 MED ORDER — ONDANSETRON HCL 4 MG/2ML IJ SOLN
4.0000 mg | Freq: Four times a day (QID) | INTRAMUSCULAR | Status: DC | PRN
  Administered 2019-06-16 – 2019-06-20 (×2): 4 mg via INTRAVENOUS
  Filled 2019-06-12 (×2): qty 2

## 2019-06-12 MED ORDER — LORAZEPAM 2 MG PO TABS: 0.0000 mg | ORAL_TABLET | Freq: Two times a day (BID) | ORAL | Status: DC

## 2019-06-12 MED ORDER — ONDANSETRON HCL 4 MG PO TABS: 4.0000 mg | ORAL_TABLET | Freq: Four times a day (QID) | ORAL | Status: DC | PRN

## 2019-06-12 MED ORDER — LORAZEPAM 1 MG PO TABS
1.0000 mg | ORAL_TABLET | Freq: Four times a day (QID) | ORAL | Status: AC | PRN
  Administered 2019-06-13 – 2019-06-15 (×3): 1 mg via ORAL
  Filled 2019-06-12 (×3): qty 1

## 2019-06-12 MED ORDER — SODIUM CHLORIDE 0.9 % IV BOLUS
1000.0000 mL | Freq: Once | INTRAVENOUS | Status: AC
  Administered 2019-06-12: 1000 mL via INTRAVENOUS

## 2019-06-12 MED ORDER — FOLIC ACID 5 MG/ML IJ SOLN
1.0000 mg | Freq: Every day | INTRAMUSCULAR | Status: DC
  Filled 2019-06-12: qty 0.2

## 2019-06-12 MED ORDER — ACETAMINOPHEN 650 MG RE SUPP: 650.0000 mg | Freq: Four times a day (QID) | RECTAL | Status: DC | PRN

## 2019-06-12 MED ORDER — FOLIC ACID 1 MG PO TABS
1.0000 mg | ORAL_TABLET | Freq: Every day | ORAL | Status: DC
  Administered 2019-06-13 – 2019-06-21 (×9): 1 mg via ORAL
  Filled 2019-06-12 (×9): qty 1

## 2019-06-12 MED ORDER — LORAZEPAM 2 MG/ML IJ SOLN
1.0000 mg | Freq: Four times a day (QID) | INTRAMUSCULAR | Status: AC | PRN
  Administered 2019-06-13: 21:00:00 1 mg via INTRAVENOUS
  Filled 2019-06-12: qty 1

## 2019-06-12 MED ORDER — VITAMIN B-1 100 MG PO TABS: 100.0000 mg | ORAL_TABLET | Freq: Every day | ORAL | Status: DC

## 2019-06-12 MED ORDER — ALUM & MAG HYDROXIDE-SIMETH 200-200-20 MG/5ML PO SUSP
30.0000 mL | Freq: Four times a day (QID) | ORAL | Status: AC
  Administered 2019-06-12 – 2019-06-13 (×4): 30 mL via ORAL
  Filled 2019-06-12 (×5): qty 30
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
## 2019-06-12 NOTE — ED Triage Notes (Signed)
Pt arrived via EMS from home d/t n/v and weakness. Pt states he has been drinking a lot recently and today he felt extremely weak. Pt reports he has been drinking more since he retired 5 years ago d/t being bored. Pt is visibly shaking and reports interest in getting help for alcohol consumption.
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
## 2019-06-12 NOTE — ED Notes (Signed)
Ativan admin'd att per edp

## 2019-06-13 ENCOUNTER — Other Ambulatory Visit: Payer: Self-pay

## 2019-06-13 ENCOUNTER — Encounter: Payer: Self-pay | Admitting: Radiology

## 2019-06-13 LAB — BASIC METABOLIC PANEL
Anion gap: 6 (ref 5–15)
BUN: 6 mg/dL — ABNORMAL LOW (ref 8–23)
CO2: 28 mmol/L (ref 22–32)
Calcium: 8.5 mg/dL — ABNORMAL LOW (ref 8.9–10.3)
Chloride: 105 mmol/L (ref 98–111)
Creatinine, Ser: 0.47 mg/dL — ABNORMAL LOW (ref 0.61–1.24)
GFR calc Af Amer: >60 mL/min (ref >60)
GFR calc non Af Amer: >60 mL/min (ref >60)
Glucose, Bld: 106 mg/dL — ABNORMAL HIGH (ref 70–99)
Potassium: 3.5 mmol/L (ref 3.5–5.1)
Sodium: 139 mmol/L (ref 135–145)

## 2019-06-13 LAB — CBC
HCT: 42 % (ref 39.0–52.0)
Hemoglobin: 14.3 g/dL (ref 13.0–17.0)
MCH: 31 pg (ref 26.0–34.0)
MCHC: 34 g/dL (ref 30.0–36.0)
MCV: 91.1 fL (ref 80.0–100.0)
Platelets: UNDETERMINED 10*3/uL (ref 150–400)
RBC: 4.61 MIL/uL (ref 4.22–5.81)
RDW: 11.9 % (ref 11.5–15.5)
WBC: 7.5 10*3/uL (ref 4.0–10.5)
nRBC: 0 % (ref 0.0–0.2)

## 2019-06-13 LAB — MAGNESIUM: Magnesium: 2.4 mg/dL (ref 1.7–2.4)

## 2019-06-13 MED ORDER — PANTOPRAZOLE SODIUM 40 MG PO TBEC
40.0000 mg | DELAYED_RELEASE_TABLET | Freq: Two times a day (BID) | ORAL | Status: DC
  Administered 2019-06-13 – 2019-06-21 (×16): 40 mg via ORAL
  Filled 2019-06-13 (×16): qty 1

## 2019-06-13 MED ORDER — HALOPERIDOL LACTATE 5 MG/ML IJ SOLN
2.0000 mg | Freq: Once | INTRAMUSCULAR | Status: AC
  Administered 2019-06-13: 22:00:00 2 mg via INTRAVENOUS
  Filled 2019-06-13: qty 1

## 2019-06-13 MED ORDER — ATENOLOL 25 MG PO TABS
25.0000 mg | ORAL_TABLET | Freq: Every day | ORAL | Status: DC
  Administered 2019-06-13 – 2019-06-21 (×9): 25 mg via ORAL
  Filled 2019-06-13 (×9): qty 1

## 2019-06-13 MED ORDER — BACLOFEN 10 MG PO TABS
10.0000 mg | ORAL_TABLET | Freq: Three times a day (TID) | ORAL | Status: DC
  Administered 2019-06-13 – 2019-06-21 (×24): 10 mg via ORAL
  Filled 2019-06-13 (×26): qty 1

## 2019-06-13 MED ORDER — PAROXETINE HCL 20 MG PO TABS
20.0000 mg | ORAL_TABLET | Freq: Every day | ORAL | Status: DC
  Administered 2019-06-13 – 2019-06-21 (×9): 20 mg via ORAL
  Filled 2019-06-13 (×9): qty 1

## 2019-06-13 MED ORDER — DILTIAZEM HCL ER COATED BEADS 120 MG PO CP24
120.0000 mg | ORAL_CAPSULE | Freq: Every day | ORAL | Status: DC
  Administered 2019-06-13 – 2019-06-21 (×9): 120 mg via ORAL
  Filled 2019-06-13 (×9): qty 1

## 2019-06-13 NOTE — Progress Notes (Signed)
[facility] [place] NAME: Douglas Peterson    MR#:  [id number]  DATE OF BIRTH:  1951-04-13  SUBJECTIVE:  CHIEF COMPLAINT:   Chief Complaint  Patient presents with  . Emesis  . Weakness   Abdominal pain is improved.  Tolerated his breakfast.  Has withdrawals.  REVIEW OF SYSTEMS:    Review of Systems  Constitutional: Positive for malaise/fatigue. Negative for chills and fever.  HENT: Negative for sore throat.   Eyes: Negative for blurred vision, double vision and pain.  Respiratory: Negative for cough, hemoptysis, shortness of breath and wheezing.   Cardiovascular: Negative for chest pain, palpitations, orthopnea and leg swelling.  Gastrointestinal: Positive for nausea. Negative for abdominal pain, constipation, diarrhea, heartburn and vomiting.  Genitourinary: Negative for dysuria and hematuria.  Musculoskeletal: Negative for back pain and joint pain.  Skin: Negative for rash.  Neurological: Positive for tremors. Negative for sensory change, speech change, focal weakness and headaches.  Endo/Heme/Allergies: Does not bruise/bleed easily.  Psychiatric/Behavioral: Negative for depression. The patient is not nervous/anxious.     DRUG ALLERGIES:  No Known Allergies  VITALS:  Blood pressure (!) 148/92, pulse 83, temperature 97.9 F (36.6 C), resp. rate 16, height 5' 4"  (1.626 m), weight 53.5 kg, SpO2 96 %.  PHYSICAL EXAMINATION:   Physical Exam  GENERAL:  68 y.o.-year-old patient lying in the bed  EYES: Pupils equal, round, reactive to light and accommodation. No scleral icterus. Extraocular muscles intact.  HEENT: Head atraumatic, normocephalic. Oropharynx and nasopharynx clear.  NECK:  Supple, no jugular venous distention. No thyroid enlargement, no tenderness.  LUNGS: Normal breath sounds bilaterally, no wheezing, rales, rhonchi. No use of accessory muscles of respiration.  CARDIOVASCULAR: S1, S2 normal. No murmurs, rubs, or gallops.   ABDOMEN: Soft, nontender, nondistended. Bowel sounds present. No organomegaly or mass.  EXTREMITIES: No cyanosis, clubbing or edema b/l.    NEUROLOGIC: Cranial nerves II through XII are intact. No focal Motor or sensory deficits b/l.   Tremors PSYCHIATRIC: The patient is alert and oriented x 3.  Anxious SKIN: No obvious rash, lesion, or ulcer.   LABORATORY PANEL:   CBC Recent Labs  Lab 06/13/19 0427  WBC 7.5  HGB 14.3  HCT 42.0  PLT PLATELET CLUMPS NOTED ON SMEAR, UNABLE TO ESTIMATE   ------------------------------------------------------------------------------------------------------------------ Chemistries  Recent Labs  Lab 06/12/19 1631 06/13/19 0427  NA 138 139  K 3.3* 3.5  CL 96* 105  CO2 26 28  GLUCOSE 120* 106*  BUN 6* 6*  CREATININE 0.46* 0.47*  CALCIUM 9.1 8.5*  MG 1.6* 2.4  AST 41  --   ALT 28  --   ALKPHOS 141*  --   BILITOT 0.7  --    ------------------------------------------------------------------------------------------------------------------  Cardiac Enzymes No results for input(s): TROPONINI in the last 168 hours. ------------------------------------------------------------------------------------------------------------------  RADIOLOGY:  Dg Abdomen Acute W/chest  Result Date: 06/12/2019 CLINICAL DATA:  Nausea vomiting. EXAM: DG ABDOMEN ACUTE W/ 1V CHEST COMPARISON:  None. FINDINGS: Nonspecific nonobstructive bowel gas pattern with diffuse gaseous distension of the colon. Heart size and mediastinal contours are within normal limits. Both lungs are clear. L2 vertebroplasty.  Probable compression fracture of L1. IMPRESSION: 1. Nonspecific nonobstructive bowel gas pattern with diffuse gaseous distension of the colon. 2. No acute cardiopulmonary disease. 3. Suspected compression fracture of L1, incompletely evaluated. Electronically Signed   By: Fidela Salisbury M.D.   On: 06/12/2019 18:03   ASSESSMENT AND PLAN:   [name] a 68 year old with  history of alcohol abuse presenting with abdominal  * Alcohol abuse with withdrawal  CIWA protocol.  Ativan PRN  Thiamine.  *  Gastritis - Secondary to alcohol abuse Improving.  Tolerated diet today.  Continue antiemetics and PPI  * Electrolyte imbalances replacing magnesium and potassium  *  Leukocytosis likely reactive  Resolved  *  Essential hypertension Start home meds. cardizem and Atenolol  *  Lovenox for DVT prophylaxis  All the records are reviewed and case discussed with Care Management/Social Worker Management plans discussed with the patient, family and they are in agreement.  CODE STATUS: FULL CODE   TOTAL TIME TAKING CARE OF THIS PATIENT: 35 minutes.   POSSIBLE D/C IN 1-2 DAYS, DEPENDING ON CLINICAL CONDITION.  [name]  M.D on 06/13/2019 at 1:04 PM  Between 7am to 6pm - Pager - [phone]  After 6pm go to [email or web address] - password [email or web address] [facility]  Office  [phone]  CC: Primary care physician; [name], MD  Note: This dictation was prepared with Dragon dictation along with smaller phrase technology. Any transcriptional errors that result from this process are unintentional.

## 2019-06-13 NOTE — Progress Notes (Signed)
   06/13/19 1000  Clinical Encounter Type  Visited With Patient  Visit Type Initial;Psychological support;Other (Comment) Programmer, applications)  Referral From Nurse  Spiritual Encounters  Spiritual Needs Emotional  Stress Factors  Patient Stress Factors Loss of control  Ch provided AD education. Pt was well aware of the process and need time to consider who to make his HCPOA. If pt needs assistance, he will reach out to the ch. Pt said "he doesn't want to depend on alcohol," and showed a strong desire for a new changed life without alcohol. Pt values independence. Pt mentioned his sister who is in an assisted living and shared his wish to stay in his own place. Pt has two adult children, a daughter and son who are not as supportive and are not in close contact with the pt. Pt divorced a while ago and hasn't sought out a new companionship. Pt was teary but held himself from crying saying he didn't want to make a fool out of himself. Pt needs continued emotional/spiritual support and would appreciate a further F/U. The unit chaplain will follow him daily.
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
## 2019-06-13 NOTE — Progress Notes (Signed)
Pt repeatedly triggering bed alarm with attempts to ambulate. Pt returned to bed six times and educated on safety since oncoming of shift. Will continue to monitor and work with pt. On safety awareness.

## 2019-06-13 NOTE — Progress Notes (Signed)
Pt has required redirection 3 times in the last ten minutes

## 2019-06-14 ENCOUNTER — Inpatient Hospital Stay: Payer: Medicare HMO

## 2019-06-14 LAB — CBC WITH DIFFERENTIAL/PLATELET
Abs Immature Granulocytes: 0.02 10*3/uL (ref 0.00–0.07)
Basophils Absolute: 0 10*3/uL (ref 0.0–0.1)
Basophils Relative: 0 %
Eosinophils Absolute: 0.1 10*3/uL (ref 0.0–0.5)
Eosinophils Relative: 1 %
HCT: 44.1 % (ref 39.0–52.0)
Hemoglobin: 15 g/dL (ref 13.0–17.0)
Immature Granulocytes: 0 %
Lymphocytes Relative: 37 %
Lymphs Abs: 2.5 10*3/uL (ref 0.7–4.0)
MCH: 31.2 pg (ref 26.0–34.0)
MCHC: 34 g/dL (ref 30.0–36.0)
MCV: 91.7 fL (ref 80.0–100.0)
Monocytes Absolute: 0.6 10*3/uL (ref 0.1–1.0)
Monocytes Relative: 9 %
Neutro Abs: 3.6 10*3/uL (ref 1.7–7.7)
Neutrophils Relative %: 53 %
Platelets: 84 10*3/uL — ABNORMAL LOW (ref 150–400)
RBC: 4.81 MIL/uL (ref 4.22–5.81)
RDW: 11.9 % (ref 11.5–15.5)
WBC: 6.8 10*3/uL (ref 4.0–10.5)
nRBC: 0 % (ref 0.0–0.2)

## 2019-06-14 LAB — BASIC METABOLIC PANEL
Anion gap: 12 (ref 5–15)
BUN: 10 mg/dL (ref 8–23)
CO2: 26 mmol/L (ref 22–32)
Calcium: 9.4 mg/dL (ref 8.9–10.3)
Chloride: 100 mmol/L (ref 98–111)
Creatinine, Ser: 0.6 mg/dL — ABNORMAL LOW (ref 0.61–1.24)
GFR calc Af Amer: >60 mL/min (ref >60)
GFR calc non Af Amer: >60 mL/min (ref >60)
Glucose, Bld: 106 mg/dL — ABNORMAL HIGH (ref 70–99)
Potassium: 3.8 mmol/L (ref 3.5–5.1)
Sodium: 138 mmol/L (ref 135–145)

## 2019-06-14 LAB — VITAMIN B12: Vitamin B-12: 523 pg/mL (ref 180–914)

## 2019-06-14 MED ORDER — MORPHINE SULFATE (PF) 2 MG/ML IV SOLN
2.0000 mg | INTRAVENOUS | Status: DC | PRN
  Administered 2019-06-14: 2 mg via INTRAVENOUS
  Filled 2019-06-14: qty 1

## 2019-06-14 MED ORDER — HALOPERIDOL LACTATE 5 MG/ML IJ SOLN
2.0000 mg | Freq: Four times a day (QID) | INTRAMUSCULAR | Status: DC | PRN
  Administered 2019-06-14 – 2019-06-21 (×9): 2 mg via INTRAVENOUS
  Filled 2019-06-14 (×9): qty 1

## 2019-06-14 MED ORDER — SODIUM CHLORIDE 0.9% FLUSH
10.0000 mL | Freq: Two times a day (BID) | INTRAVENOUS | Status: DC
  Administered 2019-06-14 – 2019-06-21 (×14): 10 mL

## 2019-06-14 MED ORDER — SODIUM CHLORIDE 0.9% FLUSH
10.0000 mL | INTRAVENOUS | Status: DC | PRN
  Administered 2019-06-14 – 2019-06-17 (×2): 10 mL
  Filled 2019-06-14 (×2): qty 40

## 2019-06-14 MED ORDER — TRAMADOL HCL 50 MG PO TABS
50.0000 mg | ORAL_TABLET | Freq: Two times a day (BID) | ORAL | Status: DC | PRN
  Administered 2019-06-14 – 2019-06-17 (×4): 50 mg via ORAL
  Filled 2019-06-14 (×4): qty 1

## 2019-06-14 NOTE — Evaluation (Signed)
Physical Therapy Evaluation Patient Details Name: Douglas Peterson. MRN: [id number] DOB: 22-Nov-1950 Today's Date: 06/14/2019   History of Present Illness  [name] a 68 year old male admitted with abdominal pain, emesis. Patient has history of heavy ETOH abuse, chronic right LE weakness, HTN. Patient also had recent fall with right UE fracture which he did not follow up with as far as receiving ROM or strengthening exercises after.  Clinical Impression  Patient received sleeping in bed, rouses to name being called. Patient requires min/mod assist for supine to sit. Has tremors throughout session, R>L. Requires min assist with sit to stand, ambulated 20 feet with RW and min assist. Patient will benefit from continued skilled PT while here to improve strength, and independence with mobility. Patient has limited use of R UE from previous fall and UE fracture a couple of months ago.        Follow Up Recommendations Supervision for mobility/OOB;Home health PT    Equipment Recommendations  None recommended by PT    Recommendations for Other Services       Precautions / Restrictions Precautions Precautions: Fall Restrictions Weight Bearing Restrictions: No      Mobility  Bed Mobility Overal bed mobility: Needs Assistance Bed Mobility: Supine to Sit     Supine to sit: Min assist     General bed mobility comments: requires assistance to bring trunk froward to sitting position  Transfers Overall transfer level: Needs assistance Equipment used: Rolling walker (2 wheeled) Transfers: Sit to/from Stand Sit to Stand: Min assist            Ambulation/Gait Ambulation/Gait assistance: Min Web designer (Feet): 20 Feet Assistive device: Rolling walker (2 wheeled) Gait Pattern/deviations: Decreased step length - right;Decreased step length - left;Decreased stride length;Shuffle Gait velocity: decreased      Stairs            Wheelchair Mobility    Modified  Rankin (Stroke Patients Only)       Balance Overall balance assessment: Needs assistance Sitting-balance support: Feet supported Sitting balance-Leahy Scale: Fair     Standing balance support: Bilateral upper extremity supported Standing balance-Leahy Scale: Fair                               Pertinent Vitals/Pain Pain Assessment: No/denies pain    Home Living Family/patient expects to be discharged to:: Private residence Living Arrangements: Alone Available Help at Discharge: Family;Available PRN/intermittently Type of Home: House Home Access: Stairs to enter Entrance Stairs-Rails: Right Entrance Stairs-Number of Steps: 2 Home Layout: One level Home Equipment: Walker - 2 wheels      Prior Function Level of Independence: Independent         Comments: patient reports he only uses walker when he feels like he needs it.     Hand Dominance   Dominant Hand: Right    Extremity/Trunk Assessment   Upper Extremity Assessment Upper Extremity Assessment: RUE deficits/detail RUE Deficits / Details: limited ROM and strength due to recent R UE fracture. RUE Coordination: decreased fine motor;decreased gross motor    Lower Extremity Assessment Lower Extremity Assessment: Generalized weakness    Cervical / Trunk Assessment Cervical / Trunk Assessment: Normal  Communication   Communication: No difficulties  Cognition Arousal/Alertness: Lethargic Behavior During Therapy: WFL for tasks assessed/performed Overall Cognitive Status: No family/caregiver present to determine baseline cognitive functioning  General Comments: patient demonstrates right sided tremors during activity.      General Comments      Exercises     Assessment/Plan    PT Assessment Patient needs continued PT services  PT Problem List Decreased strength;Decreased mobility;Decreased safety awareness;Decreased activity tolerance;Decreased  balance;Decreased knowledge of use of DME;Decreased knowledge of precautions;Decreased coordination;Decreased range of motion;Decreased cognition       PT Treatment Interventions DME instruction;Therapeutic activities;Therapeutic exercise;Gait training;Functional mobility training;Balance training;Patient/family education    PT Goals (Current goals can be found in the Care Plan section)  Acute Rehab PT Goals Patient Stated Goal: none stated PT Goal Formulation: With patient Time For Goal Achievement: 06/23/19 Potential to Achieve Goals: Fair    Frequency Min 2X/week   Barriers to discharge Decreased caregiver support      Co-evaluation               AM-PAC PT "6 Clicks" Mobility  Outcome Measure Help needed turning from your back to your side while in a flat bed without using bedrails?: A Little Help needed moving from lying on your back to sitting on the side of a flat bed without using bedrails?: A Little Help needed moving to and from a bed to a chair (including a wheelchair)?: A Little Help needed standing up from a chair using your arms (e.g., wheelchair or bedside chair)?: A Little Help needed to walk in hospital room?: A Little Help needed climbing 3-5 steps with a railing? : A Lot 6 Click Score: 17    End of Session Equipment Utilized During Treatment: Gait belt Activity Tolerance: Patient limited by fatigue Patient left: in chair;with chair alarm set;with call bell/phone within reach Nurse Communication: Mobility status PT Visit Diagnosis: Muscle weakness (generalized) (M62.81);Unsteadiness on feet (R26.81);Difficulty in walking, not elsewhere classified (R26.2);History of falling (Z91.81)    Time: 1010-1030 PT Time Calculation (min) (ACUTE ONLY): 20 min   Charges:   PT Evaluation $PT Eval Low Complexity: 1 Low PT Treatments $Gait Training: 8-22 mins         , PT, GCS 06/14/19,10:48 AM

## 2019-06-14 NOTE — Progress Notes (Signed)
[facility] NAME: Dennis Hegeman    MR#:  [id number]  DATE OF BIRTH:  1950/11/23  SUBJECTIVE:  CHIEF COMPLAINT:   Chief Complaint  Patient presents with  . Emesis  . Weakness   Sitting in a chair today.  Worked with physical therapy for Complains of chronic right shoulder pain from prior fracture.  This is worsened over the last few days.  REVIEW OF SYSTEMS:    Review of Systems  Constitutional: Positive for malaise/fatigue. Negative for chills and fever.  HENT: Negative for sore throat.   Eyes: Negative for blurred vision, double vision and pain.  Respiratory: Negative for cough, hemoptysis, shortness of breath and wheezing.   Cardiovascular: Negative for chest pain, palpitations, orthopnea and leg swelling.  Gastrointestinal: Positive for nausea. Negative for abdominal pain, constipation, diarrhea, heartburn and vomiting.  Genitourinary: Negative for dysuria and hematuria.  Musculoskeletal: Negative for back pain and joint pain.  Skin: Negative for rash.  Neurological: Positive for tremors. Negative for sensory change, speech change, focal weakness and headaches.  Endo/Heme/Allergies: Does not bruise/bleed easily.  Psychiatric/Behavioral: Negative for depression. The patient is not nervous/anxious.    DRUG ALLERGIES:  No Known Allergies  VITALS:  Blood pressure 137/84, pulse 67, temperature 97.7 F (36.5 C), resp. rate 20, height 5' 4"  (1.626 m), weight 53.5 kg, SpO2 96 %.  PHYSICAL EXAMINATION:   Physical Exam  GENERAL:  68 y.o.-year-old patient lying in the bed  EYES: Pupils equal, round, reactive to light and accommodation. No scleral icterus. Extraocular muscles intact.  HEENT: Head atraumatic, normocephalic. Oropharynx and nasopharynx clear.  NECK:  Supple, no jugular venous distention. No thyroid enlargement, no tenderness.  LUNGS: Normal breath sounds bilaterally, no wheezing, rales, rhonchi. No use of accessory  muscles of respiration.  CARDIOVASCULAR: S1, S2 normal. No murmurs, rubs, or gallops.  ABDOMEN: Soft, nontender, nondistended. Bowel sounds present. No organomegaly or mass.  EXTREMITIES: No cyanosis, clubbing or edema b/l.    NEUROLOGIC: Cranial nerves II through XII are intact. No focal Motor or sensory deficits b/l.   Tremors PSYCHIATRIC: The patient is alert and oriented x 3.  Anxious SKIN: No obvious rash, lesion, or ulcer.   LABORATORY PANEL:   CBC Recent Labs  Lab 06/14/19 0841  WBC 6.8  HGB 15.0  HCT 44.1  PLT 84*   ------------------------------------------------------------------------------------------------------------------ Chemistries  Recent Labs  Lab 06/12/19 1631 06/13/19 0427 06/14/19 0841  NA 138 139 138  K 3.3* 3.5 3.8  CL 96* 105 100  CO2 26 28 26   GLUCOSE 120* 106* 106*  BUN 6* 6* 10  CREATININE 0.46* 0.47* 0.60*  CALCIUM 9.1 8.5* 9.4  MG 1.6* 2.4  --   AST 41  --   --   ALT 28  --   --   ALKPHOS 141*  --   --   BILITOT 0.7  --   --    ------------------------------------------------------------------------------------------------------------------  Cardiac Enzymes No results for input(s): TROPONINI in the last 168 hours. ------------------------------------------------------------------------------------------------------------------  RADIOLOGY:  Dg Abdomen Acute W/chest  Result Date: 06/12/2019 CLINICAL DATA:  Nausea vomiting. EXAM: DG ABDOMEN ACUTE W/ 1V CHEST COMPARISON:  None. FINDINGS: Nonspecific nonobstructive bowel gas pattern with diffuse gaseous distension of the colon. Heart size and mediastinal contours are within normal limits. Both lungs are clear. L2 vertebroplasty.  Probable compression fracture of L1. IMPRESSION: 1. Nonspecific nonobstructive bowel gas pattern with diffuse gaseous distension of the colon. 2. No acute cardiopulmonary disease. 3.  Suspected compression fracture of L1, incompletely evaluated. Electronically Signed    By: Fidela Salisbury M.D.   On: 06/12/2019 18:03   ASSESSMENT AND PLAN:   [name] a 68 year old with history of alcohol abuse presenting with abdominal  * Alcohol abuse with withdrawal  CIWA protocol. Ativan PRN  Thiamine.  *  Gastritis - Secondary to alcohol abuse Tolerating diet.  Continue antiemetics and PPI.  * Right shoulder pain Check shoulder xray  * Electrolyte imbalances Replaced.  *  Leukocytosis likely reactive  Resolved.  *  Essential hypertension Started home meds. cardizem and Atenolol  *  Lovenox for DVT prophylaxis  Still in withdrawals. Likely d/c in AM  All the records are reviewed and case discussed with Care Management/Social Worker Management plans discussed with the patient, family and they are in agreement.  CODE STATUS: FULL CODE   TOTAL TIME TAKING CARE OF THIS PATIENT: 35 minutes.   POSSIBLE D/C IN 1-2 DAYS, DEPENDING ON CLINICAL CONDITION.  Neita Carp M.D on 06/14/2019 at 12:22 PM  Between 7am to 6pm - Pager - [phone]  After 6pm go to [email or web address] - password [email or web address] [facility]  Office  [phone]  CC: Primary care physician; [name], MD  Note: This dictation was prepared with Dragon dictation along with smaller phrase technology. Any transcriptional errors that result from this process are unintentional.

## 2019-06-14 NOTE — TOC Progression Note (Addendum)
Transition of Care (TOC) - Progression Note    Patient Details  Name: Douglas Peterson. MRN: [id number] Date of Birth: 1951-05-11  Transition of Care [facility]) CM/SW Contact  , Lenice Llamas Phone Number: [phone]  06/14/2019, 11:31 AM  Clinical Narrative: PT is recommending home health. Clinical Education officer, museum (CSW) provided CMS home health list to patient. Patient reported that he wants to use [facility]. CSW contacted [facility] representative and made him aware of above. Per Corene Cornea he will follow up with CSW once his office reviews referral. Patient reported that he has a walker and bedside commode at home. Patient has no DME needs.   12:10 pm: Per Corene Cornea [facility] can't accept patient. CSW contacted [facility] home health representative and asked her to review referral.   2:35 pm: Per Helene Kelp [facility] home health agency representative they are not in network with patient's insurance. Meredeth Ide home health agency representative declined referral. Lauretta [facility] home health agency representative accepted referral. [name] agreeable to [facility] home health. Patient gave CSW permission to contact his sister [name] and daughter [name] to update them on the plan. Per patient his daughter wants him to go to a facility and he is adamant that he will be going home. CSW contacted patient's sister [name] and daughter [name] and made them aware of above. [name] and [name] age agreeable for patient to return home. [name] and [name] reported that patient has been abusing alcohol for many years and has refused treatment. [name] reported that she has tried to get patient into inpatient alcohol rehab facilities however he refuses to go. CSW provided emotional support. [name] reported that she has had several family members with substance abuse problems. Per [name] patient has meals on wheels however sometimes he doesn't answer the door because he is intoxicated. CSW provided  emotional support.   Plan is for patient to D/C home with [facility] home health PT, OT, aide and social worker.    Expected Discharge Plan: Home/Self Care Barriers to Discharge: Continued Medical Work up  Expected Discharge Plan and Services Expected Discharge Plan: Home/Self Care In-house Referral: Clinical Social Work     Living arrangements for the past 2 months: Single Family Home                                       Social Determinants of Health (SDOH) Interventions    Readmission Risk Interventions No flowsheet data found.

## 2019-06-14 NOTE — Progress Notes (Signed)
Midline placed in the left upper arm/ brachial vein without difficulty.

## 2019-06-15 NOTE — Progress Notes (Signed)
Physical Therapy Treatment Patient Details Name: Douglas Peterson. MRN: [id number] DOB: Jul 06, 1951 Today's Date: 06/15/2019    History of Present Illness [name] a 68 year old male admitted with abdominal pain, emesis. Patient has history of heavy ETOH abuse, chronic right LE weakness, HTN. Patient also had recent fall with right UE fracture which he did not follow up with as far as receiving ROM or strengthening exercises after.    PT Comments    Pt agreeable to PT and eager to walk. Pt demonstrates Min A for bed mobility and STS with assist required to place/grip R hand due to prior injuries. Pt demonstrates mild unsteadiness initially and some RUE tremors that improve with walking requiring Min A to Min guard ultimately for ambulation. Progressing distance greatly today. Pt receives up in chair and requires Min A to set up lunch tray for eating. Continue PT to progress strength, endurance and balance to improve functional mobility and decrease risk of falls.   Follow Up Recommendations  Supervision for mobility/OOB;Home health PT     Equipment Recommendations  None recommended by PT;Other (comment)(pt states he has cane and walker)    Recommendations for Other Services       Precautions / Restrictions Precautions Precautions: Fall Restrictions Weight Bearing Restrictions: No    Mobility  Bed Mobility Overal bed mobility: Needs Assistance Bed Mobility: Supine to Sit     Supine to sit: Min assist     General bed mobility comments: for trunk  Transfers Overall transfer level: Needs assistance Equipment used: Rolling walker (2 wheeled) Transfers: Sit to/from Stand Sit to Stand: Min assist         General transfer comment: to stand and once standing to open R hand and place appropriately on hand grip; able to maintain independently afterward  Ambulation/Gait Ambulation/Gait assistance: Min assist;Min guard Gait Distance (Feet): 190 Feet Assistive device: Rolling  walker (2 wheeled) Gait Pattern/deviations: Step-to pattern Gait velocity: decreased Gait velocity interpretation: <1.31 ft/sec, indicative of household ambulator General Gait Details: small steps with decreased foot clearance B; no LOB    Stairs             Wheelchair Mobility    Modified Rankin (Stroke Patients Only)       Balance Overall balance assessment: Needs assistance Sitting-balance support: Feet supported Sitting balance-Leahy Scale: Good     Standing balance support: Bilateral upper extremity supported Standing balance-Leahy Scale: Fair                              Cognition Arousal/Alertness: Awake/alert Behavior During Therapy: WFL for tasks assessed/performed Overall Cognitive Status: Within Functional Limits for tasks assessed                                        Exercises Other Exercises Other Exercises: assisted set up for lunch tray    General Comments        Pertinent Vitals/Pain Pain Assessment: Faces Faces Pain Scale: Hurts a little bit Pain Location: R shoulder Pain Descriptors / Indicators: Sore Pain Intervention(s): Monitored during session    Home Living                      Prior Function            PT Goals (current goals can now be found  in the care plan section) Progress towards PT goals: Progressing toward goals    Frequency    Min 2X/week      PT Plan Current plan remains appropriate    Co-evaluation              AM-PAC PT "6 Clicks" Mobility   Outcome Measure  Help needed turning from your back to your side while in a flat bed without using bedrails?: A Little Help needed moving from lying on your back to sitting on the side of a flat bed without using bedrails?: A Little Help needed moving to and from a bed to a chair (including a wheelchair)?: A Little Help needed standing up from a chair using your arms (e.g., wheelchair or bedside chair)?: A Little Help  needed to walk in hospital room?: A Little Help needed climbing 3-5 steps with a railing? : A Little 6 Click Score: 18    End of Session Equipment Utilized During Treatment: Gait belt Activity Tolerance: Patient tolerated treatment well Patient left: in chair;with chair alarm set;with call bell/phone within reach   PT Visit Diagnosis: Muscle weakness (generalized) (M62.81);Unsteadiness on feet (R26.81);Difficulty in walking, not elsewhere classified (R26.2);History of falling (Z91.81)     Time: 1443-6016 PT Time Calculation (min) (ACUTE ONLY): 29 min  Charges:  $Gait Training: 8-22 mins $Therapeutic Activity: 8-22 mins                      Larae Grooms, PTA 06/15/2019, 2:23 PM

## 2019-06-15 NOTE — Progress Notes (Signed)
[place] at [place] NAME: Douglas Peterson    MR#:  [id number]  DATE OF BIRTH:  11-28-1950  SUBJECTIVE:  CHIEF COMPLAINT:   Chief Complaint  Patient presents with  . Emesis  . Weakness   Still feels weak.  Ongoing right shoulder pain. Has tremors. In withdrawals.  REVIEW OF SYSTEMS:    Review of Systems  Constitutional: Positive for malaise/fatigue. Negative for chills and fever.  HENT: Negative for sore throat.   Eyes: Negative for blurred vision, double vision and pain.  Respiratory: Negative for cough, hemoptysis, shortness of breath and wheezing.   Cardiovascular: Negative for chest pain, palpitations, orthopnea and leg swelling.  Gastrointestinal: Positive for nausea. Negative for abdominal pain, constipation, diarrhea, heartburn and vomiting.  Genitourinary: Negative for dysuria and hematuria.  Musculoskeletal: Negative for back pain and joint pain.  Skin: Negative for rash.  Neurological: Positive for tremors. Negative for sensory change, speech change, focal weakness and headaches.  Endo/Heme/Allergies: Does not bruise/bleed easily.  Psychiatric/Behavioral: Negative for depression. The patient is not nervous/anxious.    DRUG ALLERGIES:  No Known Allergies  VITALS:  Blood pressure 130/82, pulse (!) 54, temperature (!) 96.6 F (35.9 C), temperature source Axillary, resp. rate 15, height 5' 4"  (1.626 m), weight 53.5 kg, SpO2 98 %.  PHYSICAL EXAMINATION:   Physical Exam  GENERAL:  68 y.o.-year-old patient lying in the bed  EYES: Pupils equal, round, reactive to light and accommodation. No scleral icterus. Extraocular muscles intact.  HEENT: Head atraumatic, normocephalic. Oropharynx and nasopharynx clear.  NECK:  Supple, no jugular venous distention. No thyroid enlargement, no tenderness.  LUNGS: Normal breath sounds bilaterally, no wheezing, rales, rhonchi. No use of accessory muscles of respiration.  CARDIOVASCULAR: S1, S2  normal. No murmurs, rubs, or gallops.  ABDOMEN: Soft, nontender, nondistended. Bowel sounds present. No organomegaly or mass.  EXTREMITIES: No cyanosis, clubbing or edema b/l.    NEUROLOGIC: Cranial nerves II through XII are intact. No focal Motor or sensory deficits b/l.   Tremors PSYCHIATRIC: The patient is alert and oriented x 3.  Anxious SKIN: No obvious rash, lesion, or ulcer.   LABORATORY PANEL:   CBC Recent Labs  Lab 06/14/19 0841  WBC 6.8  HGB 15.0  HCT 44.1  PLT 84*   ------------------------------------------------------------------------------------------------------------------ Chemistries  Recent Labs  Lab 06/12/19 1631 06/13/19 0427 06/14/19 0841  NA 138 139 138  K 3.3* 3.5 3.8  CL 96* 105 100  CO2 26 28 26   GLUCOSE 120* 106* 106*  BUN 6* 6* 10  CREATININE 0.46* 0.47* 0.60*  CALCIUM 9.1 8.5* 9.4  MG 1.6* 2.4  --   AST 41  --   --   ALT 28  --   --   ALKPHOS 141*  --   --   BILITOT 0.7  --   --    ------------------------------------------------------------------------------------------------------------------  Cardiac Enzymes No results for input(s): TROPONINI in the last 168 hours. ------------------------------------------------------------------------------------------------------------------  RADIOLOGY:  Dg Shoulder Right  Result Date: 06/14/2019 CLINICAL DATA:  Pain and limitation of motion.  Previous fracture. EXAM: RIGHT SHOULDER - 2+ VIEW COMPARISON:  Mar 19, 2019 FINDINGS: Frontal, oblique, and Y scapular images obtained. There is evidence of a prior fracture of the proximal humeral metaphysis with remodeling in this area. There is mild callus formation in this area. No acute fracture evident. No dislocation. There is moderate generalized osteoarthritic change. Bones are osteoporotic. Visualized right lung clear. IMPRESSION: Prior comminuted fracture proximal humeral metaphysis with  remodeling. Mild impaction at the fracture site noted. No acute  fracture or dislocation. There is moderate generalized osteoarthritic change. Bones appear somewhat osteoporotic. Electronically Signed   By: Lowella Grip III M.D.   On: 06/14/2019 12:52   ASSESSMENT AND PLAN:   [name] a 68 year old with history of alcohol abuse presenting with abdominal  * Alcohol abuse with withdrawal  CIWA protocol. Ativan PRN  Thiamine. Will need at least 1 more day of inpatient treatment.  *  Gastritis - Secondary to alcohol abuse Tolerating diet.  Continue antiemetics and PPI. Resolved  * Right shoulder pain X-ray showing old fracture with impaction.  Needs outpatient orthopedic follow-up  * Electrolyte imbalances Replaced.  *  Leukocytosis likely reactive  Resolved.  *  Essential hypertension Started home meds. cardizem and Atenolol  *  Lovenox for DVT prophylaxis  Still in withdrawals.  Likely discharge tomorrow  Home health services at discharge  All the records are reviewed and case discussed with Care Management/Social Worker Management plans discussed with the patient, family and they are in agreement.  CODE STATUS: FULL CODE   TOTAL TIME TAKING CARE OF THIS PATIENT: 35 minutes.   POSSIBLE D/C IN 1-2 DAYS, DEPENDING ON CLINICAL CONDITION.  [name]  M.D on 06/15/2019 at 12:41 PM  Between 7am to 6pm - Pager - [phone]  After 6pm go to [email or web address] - password [email or web address] [facility]  Office  [phone]  CC: Primary care physician; [name], MD  Note: This dictation was prepared with Dragon dictation along with smaller phrase technology. Any transcriptional errors that result from this process are unintentional.

## 2019-06-15 NOTE — Care Management Important Message (Signed)
Important Message  Patient Details  Name: Douglas Peterson. MRN: [id number] Date of Birth: 05-Nov-1950   Medicare Important Message Given:  Yes     Juliann Pulse A  06/15/2019, 11:19 AM

## 2019-06-16 MED ORDER — SENNOSIDES-DOCUSATE SODIUM 8.6-50 MG PO TABS
2.0000 | ORAL_TABLET | Freq: Two times a day (BID) | ORAL | Status: DC
  Administered 2019-06-16 – 2019-06-19 (×7): 2 via ORAL
  Filled 2019-06-16 (×7): qty 2

## 2019-06-16 MED ORDER — LORAZEPAM 2 MG/ML IJ SOLN
0.0000 mg | Freq: Two times a day (BID) | INTRAMUSCULAR | Status: DC
  Administered 2019-06-16 – 2019-06-17 (×2): 2 mg via INTRAVENOUS
  Administered 2019-06-17: 1 mg via INTRAVENOUS
  Filled 2019-06-16 (×2): qty 1

## 2019-06-16 NOTE — Progress Notes (Signed)
Patient given ativan dose for CIWA per order. Pt became agitated and restless trying to get out of bed multiple times, patient given Haldol and is currently resting calmly. VS stable. Easily arousable by voice, will continue to monitor.

## 2019-06-16 NOTE — Progress Notes (Signed)
PT Cancellation Note  Patient Details Name: Douglas Peterson. MRN: [id number] DOB: 08-Aug-1951   Cancelled Treatment:    Reason Eval/Treat Not Completed: Other (comment), Treatment attempted; pt with nursing staff who request return at a later time. Re attempt, as the schedule allows.    Larae Grooms, PTA 06/16/2019, 1:54 PM
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
## 2019-06-16 NOTE — Progress Notes (Signed)
Chaplain received a referral from the patient's nurse for support. Upon arrival, the patient was seated in the recliner. He was awake and alert. The patient asked if he was at [facility], which I confirmed as the patient was attempting to get his bearings. This chaplain provided compassionate presence and active and reflective listening as the patient shared how he was feeling. The patient reported that he was concerned about receiving two medications and that he was feeling pain in his right shoulder. This chaplain talked with patient's nurse upon her arrival to the room to relay the patient's questions and assuage his concerns about his medication schedule. This chaplain talked to the patient's sister [name]) via his cell phone to answer questions about visitation. Chaplain ended the visit to allow the patient to talk with his sister.

## 2019-06-16 NOTE — TOC Progression Note (Signed)
Transition of Care (TOC) - Progression Note    Patient Details  Name: Douglas Peterson. MRN: [id number] Date of Birth: 08/23/1951  Transition of Care [facility]) CM/SW Contact  , Lenice Llamas Phone Number: [phone]  06/16/2019, 12:08 PM  Clinical Narrative: Clinical Social Worker (CSW) made [place] home health representative aware that per MD patient may D/C home tomorrow.     Expected Discharge Plan: Home/Self Care Barriers to Discharge: Continued Medical Work up  Expected Discharge Plan and Services Expected Discharge Plan: Home/Self Care In-house Referral: Clinical Social Work     Living arrangements for the past 2 months: Single Family Home                                       Social Determinants of Health (SDOH) Interventions    Readmission Risk Interventions No flowsheet data found.

## 2019-06-17 LAB — COMPREHENSIVE METABOLIC PANEL
ALT: 26 U/L (ref 0–44)
AST: 28 U/L (ref 15–41)
Albumin: 4.3 g/dL (ref 3.5–5.0)
Alkaline Phosphatase: 100 U/L (ref 38–126)
Anion gap: 8 (ref 5–15)
BUN: 15 mg/dL (ref 8–23)
CO2: 26 mmol/L (ref 22–32)
Calcium: 9.2 mg/dL (ref 8.9–10.3)
Chloride: 102 mmol/L (ref 98–111)
Creatinine, Ser: 0.45 mg/dL — ABNORMAL LOW (ref 0.61–1.24)
GFR calc Af Amer: >60 mL/min (ref >60)
GFR calc non Af Amer: >60 mL/min (ref >60)
Glucose, Bld: 101 mg/dL — ABNORMAL HIGH (ref 70–99)
Potassium: 3.8 mmol/L (ref 3.5–5.1)
Sodium: 136 mmol/L (ref 135–145)
Total Bilirubin: 0.6 mg/dL (ref 0.3–1.2)
Total Protein: 7.3 g/dL (ref 6.5–8.1)

## 2019-06-17 LAB — CBC
HCT: 42.8 % (ref 39.0–52.0)
Hemoglobin: 14.5 g/dL (ref 13.0–17.0)
MCH: 30.7 pg (ref 26.0–34.0)
MCHC: 33.9 g/dL (ref 30.0–36.0)
MCV: 90.7 fL (ref 80.0–100.0)
Platelets: 116 10*3/uL — ABNORMAL LOW (ref 150–400)
RBC: 4.72 MIL/uL (ref 4.22–5.81)
RDW: 11.9 % (ref 11.5–15.5)
WBC: 7.1 10*3/uL (ref 4.0–10.5)
nRBC: 0 % (ref 0.0–0.2)

## 2019-06-17 MED ORDER — LORAZEPAM 2 MG/ML IJ SOLN
1.0000 mg | Freq: Four times a day (QID) | INTRAMUSCULAR | Status: DC | PRN
  Administered 2019-06-17: 2 mg via INTRAVENOUS
  Administered 2019-06-18: 06:00:00 1 mg via INTRAVENOUS
  Administered 2019-06-18: 2 mg via INTRAVENOUS
  Filled 2019-06-17 (×3): qty 1

## 2019-06-17 MED ORDER — CHLORDIAZEPOXIDE HCL 10 MG PO CAPS
10.0000 mg | ORAL_CAPSULE | Freq: Three times a day (TID) | ORAL | Status: DC
  Administered 2019-06-17 – 2019-06-21 (×13): 10 mg via ORAL
  Filled 2019-06-17 (×13): qty 1
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
## 2019-06-17 NOTE — NC FL2 (Signed)
[place] LEVEL OF CARE SCREENING TOOL     IDENTIFICATION  Patient Name: Douglas Peterson. Birthdate: Mar 19, 1951 Sex: male Admission Date (Current Location): 06/12/2019  [place] and [place] Number:  Engineering geologist and Address:  [facility], [address], [place], [place] [address]      Provider Number: [id number]  Attending Physician Name and Address:  Gladstone Lighter, MD  Relative Name and Phone Number:       Current Level of Care: Hospital Recommended Level of Care: [place] Prior Approval Number:    Date Approved/Denied:   PASRR Number: [id number]  Discharge Plan: SNF    Current Diagnoses: Patient Active Problem List   Diagnosis Date Noted  . Goals of care, counseling/discussion   . Palliative care by specialist   . Alcohol withdrawal ([place]) 03/20/2019  . Low back pain 11/15/2018  . AMS (altered mental status) 10/12/2018  . Delirium tremens ([place]) 03/08/2018  . Malnutrition of moderate degree 11/24/2017  . HTN (hypertension) 09/16/2017  . Hypothermia 12/17/2016  . Alcohol use   . Diarrhea   . Hyponatremia 07/09/2015    Orientation RESPIRATION BLADDER Height & Weight     Self, Place  Normal Incontinent Weight: 118 lb (53.5 kg) Height:  5' 4"  (162.6 cm)  BEHAVIORAL SYMPTOMS/MOOD NEUROLOGICAL BOWEL NUTRITION STATUS      Continent Diet(Diet: Heart Healthy)  AMBULATORY STATUS COMMUNICATION OF NEEDS Skin   Extensive Assist Verbally Normal                       Personal Care Assistance Level of Assistance  Bathing, Dressing, Feeding Bathing Assistance: Limited assistance Feeding assistance: Independent Dressing Assistance: Limited assistance     Functional Limitations Info  Sight, Hearing, Speech Sight Info: Adequate Hearing Info: Impaired Speech Info: Adequate    SPECIAL CARE FACTORS FREQUENCY  PT (By licensed PT), OT (By licensed OT)     PT Frequency: 5 OT Frequency: 5             Contractures      Additional Factors Info  Code Status, Allergies Code Status Info: Full Code. Allergies Info: No Known Allergies.           Current Medications (06/17/2019):  This is the current hospital active medication list Current Facility-Administered Medications  Medication Dose Route Frequency Provider Last Rate Last Dose  . acetaminophen (TYLENOL) tablet 650 mg  650 mg Oral Q6H PRN Dustin Flock, MD   650 mg at 06/16/19 2009   Or  . acetaminophen (TYLENOL) suppository 650 mg  650 mg Rectal Q6H PRN Dustin Flock, MD      . atenolol (TENORMIN) tablet 25 mg  25 mg Oral Daily Hillary Bow, MD   25 mg at 06/17/19 1100  . baclofen (LIORESAL) tablet 10 mg  10 mg Oral TID Hillary Bow, MD   10 mg at 06/17/19 1100  . diltiazem (CARDIZEM CD) 24 hr capsule 120 mg  120 mg Oral Daily Sudini, Alveta Heimlich, MD   120 mg at 06/17/19 1101  . folic acid (FOLVITE) tablet 1 mg  1 mg Oral Daily Lance Coon, MD   1 mg at 06/17/19 1100  . haloperidol lactate (HALDOL) injection 2 mg  2 mg Intravenous Q6H PRN Lance Coon, MD   2 mg at 06/17/19 0527  . LORazepam (ATIVAN) injection 0-4 mg  0-4 mg Intravenous Q12H Gladstone Lighter, MD   1 mg at 06/17/19 1206  . morphine 2 MG/ML  injection 2 mg  2 mg Intravenous Q4H PRN Lance Coon, MD   2 mg at 06/14/19 8242  . multivitamin with minerals tablet 1 tablet  1 tablet Oral Daily Lance Coon, MD   1 tablet at 06/17/19 1100  . ondansetron (ZOFRAN) tablet 4 mg  4 mg Oral Q6H PRN Dustin Flock, MD       Or  . ondansetron [facility]) injection 4 mg  4 mg Intravenous Q6H PRN Dustin Flock, MD   4 mg at 06/16/19 2009  . pantoprazole (PROTONIX) EC tablet 40 mg  40 mg Oral BID AC Sudini, Alveta Heimlich, MD   40 mg at 06/17/19 1100  . PARoxetine (PAXIL) tablet 20 mg  20 mg Oral Daily Hillary Bow, MD   20 mg at 06/17/19 1101  . senna-docusate (Senokot-S) tablet 2 tablet  2 tablet Oral BID Gladstone Lighter, MD   2 tablet at 06/17/19 1100  . sodium  chloride flush (NS) 0.9 % injection 10-40 mL  10-40 mL Intracatheter Q12H Sudini, Srikar, MD   10 mL at 06/17/19 1101  . sodium chloride flush (NS) 0.9 % injection 10-40 mL  10-40 mL Intracatheter PRN Hillary Bow, MD   10 mL at 06/14/19 1849  . thiamine (VITAMIN B-1) tablet 100 mg  100 mg Oral Daily Lance Coon, MD   100 mg at 06/17/19 1100   Or  . thiamine (B-1) injection 100 mg  100 mg Intravenous Daily Lance Coon, MD   100 mg at 06/13/19 3536  . traMADol (ULTRAM) tablet 50 mg  50 mg Oral Q12H PRN Hillary Bow, MD   50 mg at 06/16/19 1609     Discharge Medications: Please see discharge summary for a list of discharge medications.  Relevant Imaging Results:  Relevant Lab Results:   Additional Information SSN: [id number]  , Veronia Beets, LCSW

## 2019-06-17 NOTE — Plan of Care (Signed)

## 2019-06-17 NOTE — TOC Progression Note (Addendum)
Transition of Care (TOC) - Progression Note    Patient Details  Name: Douglas Peterson. MRN: [id number] Date of Birth: 1951-08-25  Transition of Care [facility]) CM/SW Contact  , Lenice Llamas Phone Number: [phone]  06/17/2019, 2:50 PM  Clinical Narrative: PT changed recommendation from home health to SNF today. Clinical Social Worker (CSW) met with patient and made him aware of above. [name] agreeable to SNF search in [facility] and prefers [facility]. Per patient he was recently at [facility] in May 2020. FL2 complete and faxed out. CSW will continue to follow and assist as needed.    3:15 pm: CSW presented bed offer to patient. Patient accepted offer from [facility]. Per Tammy admissions coordinator at [facility] she will start [facility] authorization today. Patient will need another covid test prior to D/C to [facility]. Patient's daughter [name] is aware of above.       Expected Discharge Plan: Home/Self Care Barriers to Discharge: Continued Medical Work up  Expected Discharge Plan and Services Expected Discharge Plan: Home/Self Care In-house Referral: Clinical Social Work     Living arrangements for the past 2 months: Single Family Home                                       Social Determinants of Health (SDOH) Interventions    Readmission Risk Interventions No flowsheet data found.

## 2019-06-17 NOTE — Progress Notes (Signed)
Physical Therapy Treatment Patient Details Name: Douglas Peterson. MRN: [id number] DOB: [birth year] Today's Date: 06/17/2019    History of Present Illness [name] a 68 year old male admitted with abdominal pain, emesis. Patient has history of heavy ETOH abuse, chronic right LE weakness, HTN. Patient also had recent fall with right UE fracture which he did not follow up with as far as receiving ROM or strengthening exercises after.    PT Comments    Pt in bed, ready to get up.  To edge of bed with rail and min a x 1 with decreased sitting balance upon transitioning.  He is able to stand with min a x 1 but with poor standing balance requiring outside assist to prevent post LOB back on the bed and mod a x 1 to place R hand on walker handle.  He is unable to do this on his own.  He does report some increased tremors overall today.  He is able to walk out of room and around small nursing pod.  He requires constant hands on assist for gait to prevent falls.  Short shuffling steps which place pt at increased risk of falls and poor recovery.  Pt is aware of increased difficulties and his inability to care for himself at this time.  He is unable to feed himself due to tremors and weakness and relies on staff for basic needs.  Original recommendation for HHPT but will update to SNF to reflect current needs as pt lives alone.   Follow Up Recommendations  SNF     Equipment Recommendations  None recommended by PT;Other (comment)    Recommendations for Other Services       Precautions / Restrictions Precautions Precautions: Fall Restrictions Weight Bearing Restrictions: No    Mobility  Bed Mobility Overal bed mobility: Needs Assistance Bed Mobility: Supine to Sit     Supine to sit: Min assist        Transfers Overall transfer level: Needs assistance Equipment used: Rolling walker (2 wheeled) Transfers: Sit to/from Stand Sit to Stand: Min assist;Mod assist         General transfer  comment: assist to place R hand on walker and to prevent post LOB back onto bed.  Ambulation/Gait Ambulation/Gait assistance: Min assist Gait Distance (Feet): 160 Feet Assistive device: Rolling walker (2 wheeled) Gait Pattern/deviations: Step-through pattern;Decreased step length - right;Decreased step length - left;Shuffle Gait velocity: decreased   General Gait Details: small steps with decreased foot clearance B; frequent LOB post today especially initially with gait, would have fallen without assist.   Stairs             Wheelchair Mobility    Modified Rankin (Stroke Patients Only)       Balance Overall balance assessment: Needs assistance Sitting-balance support: Feet supported Sitting balance-Leahy Scale: Fair     Standing balance support: Bilateral upper extremity supported Standing balance-Leahy Scale: Poor Standing balance comment: frequently required assist to prevent post LOB and falls in static and dynamic tasks                            Cognition Arousal/Alertness: Awake/alert Behavior During Therapy: WFL for tasks assessed/performed Overall Cognitive Status: Within Functional Limits for tasks assessed  Exercises      General Comments        Pertinent Vitals/Pain Pain Assessment: No/denies pain    Home Living                      Prior Function            PT Goals (current goals can now be found in the care plan section) Progress towards PT goals: Progressing toward goals    Frequency    Min 2X/week      PT Plan Discharge plan needs to be updated    Co-evaluation              AM-PAC PT "6 Clicks" Mobility   Outcome Measure  Help needed turning from your back to your side while in a flat bed without using bedrails?: A Little Help needed moving from lying on your back to sitting on the side of a flat bed without using bedrails?: A Little Help  needed moving to and from a bed to a chair (including a wheelchair)?: A Little Help needed standing up from a chair using your arms (e.g., wheelchair or bedside chair)?: A Little Help needed to walk in hospital room?: A Little Help needed climbing 3-5 steps with a railing? : A Lot 6 Click Score: 17    End of Session Equipment Utilized During Treatment: Gait belt Activity Tolerance: Patient tolerated treatment well Patient left: in chair;with chair alarm set;with call bell/phone within reach;with nursing/sitter in room Nurse Communication: Mobility status       Time: 1048-1101 PT Time Calculation (min) (ACUTE ONLY): 13 min  Charges:  $Gait Training: 8-22 mins                    Chesley Noon, PTA 06/17/19, 11:13 AM
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
## 2019-06-19 MED ORDER — SENNOSIDES-DOCUSATE SODIUM 8.6-50 MG PO TABS: 2.0000 | ORAL_TABLET | Freq: Two times a day (BID) | ORAL | Status: DC | PRN

## 2019-06-19 NOTE — Progress Notes (Signed)
Patient states he does not want to be here anymore, he has a house and wants to go home. Reminded him that he is here for his safety and he is going to [facility] to get stronger.  Ambulated with walker around nursing station, patient shuffled steps but stronger than previous nights. Ambulated without walker from edge of bed to sit down.  Will continue to monitor. Stacie Glaze, RN

## 2019-06-19 NOTE — Progress Notes (Signed)
Patient impulsive and at risk for falls, moved patient to room 118 for safety.  Stacie Glaze, RN

## 2019-06-19 NOTE — Progress Notes (Signed)
Pt alert, pleasant, answers direct question correctly, but has limited memory recall; impulsive about getting OOB. Eating well. Senna adjusted due to increases stooling with pt advised. No further need for CIWA. Anticipating placement in near future.
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
## 2019-06-20 LAB — SARS CORONAVIRUS 2 BY RT PCR (HOSPITAL ORDER, PERFORMED IN ~~LOC~~ HOSPITAL LAB): SARS Coronavirus 2: NEGATIVE

## 2019-06-20 MED ORDER — CHLORDIAZEPOXIDE HCL 10 MG PO CAPS: 10.0000 mg | ORAL_CAPSULE | Freq: Three times a day (TID) | ORAL | 0 refills | Status: DC

## 2019-06-20 NOTE — Plan of Care (Signed)

## 2019-06-20 NOTE — Care Management Important Message (Signed)
Important Message  Patient Details  Name: Douglas Peterson. MRN: [id number] Date of Birth: Sep 16, 1951   Medicare Important Message Given:  Yes     [name] 06/20/2019, 11:13 AM
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
## 2019-06-21 NOTE — TOC Transition Note (Signed)
Transition of Care [facility]) - CM/SW Discharge Note   Patient Details  Name: Douglas Peterson. MRN: [id number] Date of Birth: 05/14/1951  Transition of Care [facility]) CM/SW Contact:  Shelbie Hutching, RN Phone Number: 06/21/2019, 3:07 PM   Clinical Narrative:    Unfortunately [facility] does not have any private pay beds at this time. Patient does not wish to go to any other facility.   Patient walked around the nurses station 4 times with the bedside RN and appears much happier and agreeable in demeanor than earlier.  [name] agreeable to go home and EMS transport will be arranged.   [facility] Health set [facility] with [facility].  Sister [name] updated.     Final next level of care: [place] Barriers to Discharge: Barriers Resolved   Patient Goals and CMS Choice Patient states their goals for this hospitalization and ongoing recovery are:: To stop drinking. CMS Medicare.gov Compare Post Acute Care list provided to:: Patient Choice offered to / list presented to : Patient  Discharge Placement                       Discharge Plan and Services In-house Referral: Clinical Social Work                        HH Arranged: Therapist, sports, PT, OT, Nurse's Aide, Social Work [employer] Agency: [facility] Date [place] Agency Contacted: 06/21/19 Time [place]: 6967 Representative spoke with at [place]: [place] ([place]) Interventions     Readmission Risk Interventions No flowsheet data found.

## 2019-06-21 NOTE — TOC Progression Note (Signed)
Transition of Care (TOC) - Progression Note    Patient Details  Name: Douglas Peterson. MRN: [id number] Date of Birth: May 15, 1951  Transition of Care [facility]) CM/SW Contact  Shelbie Hutching, RN Phone Number: 06/21/2019, 2:33 PM  Clinical Narrative:    Talked with patient about discharging home today and patient became very upset stating "I'm not going anywhere".  Patient was informed that [name] had denied to pay for SNF so patient wound need to go home with home health services or privately pay for SNF.  Patient insists that he does not have funds to pay for rehab and that he cannot go home because there is no one to take care of him.  Patient reports that he needs about 5 days.  Patient says his sister [name] helps take care of him and she is at the beach.  Patient gives permission for [facility] to call sister [name].  [name] reports that she is at the beach but is not coming back until next Monday and may stay even longer than that.  [name] also reports that she does check on him about once a week and help him clean.  [name] reports that the patient does have the money to pay privately for 2-3 weeks and that he has paid in the past to stay at [facility].   There is no medical need for patient to stay in the hospital, MD has placed discharge orders, patient has the option to discharge home with home health or to discharge to [facility] and privately pay.   Patient chooses to privately pay for [facility]. Leonides Sake with [facility] is checking with the Director to ensure they can still accept as private pay.     Expected Discharge Plan: [place] Barriers to Discharge: Barriers Resolved  Expected Discharge Plan and Services Expected Discharge Plan: [place] In-house Referral: Clinical Social Work     Living arrangements for the past 2 months: Single Family Home Expected Discharge Date: 06/21/19                         HH Arranged: RN, PT, OT, Nurse's Aide, Social Work [employer] Agency: [facility] Date [place]: 06/21/19 Time [place]: 0923 Representative spoke with at [place]: [place] ([place]) Interventions    Readmission Risk Interventions No flowsheet data found.

## 2019-06-21 NOTE — TOC Transition Note (Signed)
Transition of Care [facility]) - CM/SW Discharge Note   Patient Details  Name: Douglas Peterson. MRN: [id number] Date of Birth: 06/29/1951  Transition of Care [facility]) CM/SW Contact:  Shelbie Hutching, RN Phone Number: 06/21/2019, 12:14 PM   Clinical Narrative:    [name] has denied authorization for SNF, patient will discharge home with home health services.  [facility] accepted home health referral.  [name] Has offered member discharge planning support resources, that have been faxed to this [facility] and will be provided to the patient by this RNCM.  Patient will be assigned a case manager with [employer] to follow up with patient once he is discharged.  Patient's daughter [name] has been updated on plan of care.  Patient will attempt to find transportation home, if he is unable to find transportation RNCM can provide taxi voucher.    Final next level of care: [place] Barriers to Discharge: Barriers Resolved   Patient Goals and CMS Choice Patient states their goals for this hospitalization and ongoing recovery are:: To stop drinking. CMS Medicare.gov Compare Post Acute Care list provided to:: Patient Choice offered to / list presented to : Patient  Discharge Placement                       Discharge Plan and Services In-house Referral: Clinical Social Work                        HH Arranged: Therapist, sports, PT, OT, Nurse's Aide, Social Work [employer] Agency: [facility] Date [place] Agency Contacted: 06/21/19 Time [place]: 2774 Representative spoke with at [place]: [place] ([place]) Interventions     Readmission Risk Interventions No flowsheet data found.

## 2019-06-21 NOTE — Progress Notes (Signed)
Patient ID: Douglas Peterson., male   DOB: Jun 25, 1951, 68 y.o.   MRN: [id number] insurance denied authorization. Patient will be going home with home health PT. Social worker has informed patient's daughter and she is okay with it. Discharge to home with home health
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
## 2019-06-21 NOTE — Discharge Instructions (Signed)
Stop drinking alcohol!!

## 2019-06-21 NOTE — Progress Notes (Signed)
Pt will be DC home today. EMS has been called for transportation.

## 2019-06-21 NOTE — Progress Notes (Signed)
EMS is here to transport pt home. DC education completed.
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
## 2019-08-14 ENCOUNTER — Inpatient Hospital Stay: Payer: Medicare HMO

## 2019-08-14 ENCOUNTER — Inpatient Hospital Stay
Admission: EM | Admit: 2019-08-14 | Discharge: 2019-08-25 | DRG: 640 | Disposition: A | Discharge status: Home Health | Payer: Medicare HMO | Attending: Internal Medicine | Admitting: Internal Medicine

## 2019-08-14 ENCOUNTER — Other Ambulatory Visit: Payer: Self-pay

## 2019-08-14 ENCOUNTER — Encounter: Payer: Self-pay | Admitting: Intensive Care

## 2019-08-14 DIAGNOSIS — Z8249 Family history of ischemic heart disease and other diseases of the circulatory system: Secondary | ICD-10-CM | POA: Diagnosis not present

## 2019-08-14 DIAGNOSIS — T1490XS Injury, unspecified, sequela: Secondary | ICD-10-CM

## 2019-08-14 DIAGNOSIS — R531 Weakness: Secondary | ICD-10-CM

## 2019-08-14 DIAGNOSIS — L8993 Pressure ulcer of unspecified site, stage 3: Secondary | ICD-10-CM | POA: Diagnosis present

## 2019-08-14 DIAGNOSIS — L89302 Pressure ulcer of unspecified buttock, stage 2: Secondary | ICD-10-CM | POA: Diagnosis present

## 2019-08-14 DIAGNOSIS — Z20828 Contact with and (suspected) exposure to other viral communicable diseases: Secondary | ICD-10-CM | POA: Diagnosis present

## 2019-08-14 DIAGNOSIS — F10239 Alcohol dependence with withdrawal, unspecified: Secondary | ICD-10-CM | POA: Diagnosis present

## 2019-08-14 DIAGNOSIS — I10 Essential (primary) hypertension: Secondary | ICD-10-CM | POA: Diagnosis present

## 2019-08-14 DIAGNOSIS — Z7982 Long term (current) use of aspirin: Secondary | ICD-10-CM | POA: Diagnosis not present

## 2019-08-14 DIAGNOSIS — M6281 Muscle weakness (generalized): Secondary | ICD-10-CM | POA: Diagnosis present

## 2019-08-14 DIAGNOSIS — F418 Other specified anxiety disorders: Secondary | ICD-10-CM | POA: Diagnosis present

## 2019-08-14 DIAGNOSIS — Z79899 Other long term (current) drug therapy: Secondary | ICD-10-CM

## 2019-08-14 DIAGNOSIS — K760 Fatty (change of) liver, not elsewhere classified: Secondary | ICD-10-CM | POA: Diagnosis present

## 2019-08-14 DIAGNOSIS — E871 Hypo-osmolality and hyponatremia: Principal | ICD-10-CM | POA: Diagnosis present

## 2019-08-14 DIAGNOSIS — L8990 Pressure ulcer of unspecified site, unspecified stage: Secondary | ICD-10-CM | POA: Insufficient documentation

## 2019-08-14 DIAGNOSIS — F1010 Alcohol abuse, uncomplicated: Secondary | ICD-10-CM

## 2019-08-14 DIAGNOSIS — R54 Age-related physical debility: Secondary | ICD-10-CM | POA: Diagnosis present

## 2019-08-14 DIAGNOSIS — Z23 Encounter for immunization: Secondary | ICD-10-CM | POA: Diagnosis present

## 2019-08-14 DIAGNOSIS — E876 Hypokalemia: Secondary | ICD-10-CM | POA: Diagnosis not present

## 2019-08-14 DIAGNOSIS — Z87891 Personal history of nicotine dependence: Secondary | ICD-10-CM | POA: Diagnosis not present

## 2019-08-14 DIAGNOSIS — L89501 Pressure ulcer of unspecified ankle, stage 1: Secondary | ICD-10-CM | POA: Insufficient documentation

## 2019-08-14 DIAGNOSIS — D696 Thrombocytopenia, unspecified: Secondary | ICD-10-CM | POA: Diagnosis present

## 2019-08-14 DIAGNOSIS — Z801 Family history of malignant neoplasm of trachea, bronchus and lung: Secondary | ICD-10-CM | POA: Diagnosis not present

## 2019-08-14 DIAGNOSIS — L98429 Non-pressure chronic ulcer of back with unspecified severity: Secondary | ICD-10-CM

## 2019-08-14 DIAGNOSIS — L89152 Pressure ulcer of sacral region, stage 2: Secondary | ICD-10-CM | POA: Diagnosis present

## 2019-08-14 DIAGNOSIS — K709 Alcoholic liver disease, unspecified: Secondary | ICD-10-CM

## 2019-08-14 DIAGNOSIS — F10229 Alcohol dependence with intoxication, unspecified: Secondary | ICD-10-CM | POA: Diagnosis present

## 2019-08-14 LAB — URINALYSIS, COMPLETE (UACMP) WITH MICROSCOPIC
Bacteria, UA: NONE SEEN
Bilirubin Urine: NEGATIVE
Glucose, UA: NEGATIVE mg/dL
Hgb urine dipstick: NEGATIVE
Ketones, ur: NEGATIVE mg/dL
Leukocytes,Ua: NEGATIVE
Nitrite: NEGATIVE
Protein, ur: NEGATIVE mg/dL
Specific Gravity, Urine: 1.002 — ABNORMAL LOW (ref 1.005–1.030)
Squamous Epithelial / HPF: NONE SEEN (ref 0–5)
WBC, UA: NONE SEEN WBC/hpf (ref 0–5)
pH: 6 (ref 5.0–8.0)

## 2019-08-14 LAB — COMPREHENSIVE METABOLIC PANEL
ALT: 54 U/L — ABNORMAL HIGH (ref 0–44)
AST: 91 U/L — ABNORMAL HIGH (ref 15–41)
Albumin: 4.8 g/dL (ref 3.5–5.0)
Alkaline Phosphatase: 141 U/L — ABNORMAL HIGH (ref 38–126)
Anion gap: 16 — ABNORMAL HIGH (ref 5–15)
BUN: <5 mg/dL — ABNORMAL LOW (ref 8–23)
CO2: 24 mmol/L (ref 22–32)
Calcium: 8.5 mg/dL — ABNORMAL LOW (ref 8.9–10.3)
Chloride: 89 mmol/L — ABNORMAL LOW (ref 98–111)
Creatinine, Ser: 0.32 mg/dL — ABNORMAL LOW (ref 0.61–1.24)
GFR calc Af Amer: >60 mL/min (ref >60)
GFR calc non Af Amer: >60 mL/min (ref >60)
Glucose, Bld: 90 mg/dL (ref 70–99)
Potassium: 3.7 mmol/L (ref 3.5–5.1)
Sodium: 129 mmol/L — ABNORMAL LOW (ref 135–145)
Total Bilirubin: 0.9 mg/dL (ref 0.3–1.2)
Total Protein: 8 g/dL (ref 6.5–8.1)

## 2019-08-14 LAB — CBC
HCT: 44.3 % (ref 39.0–52.0)
Hemoglobin: 15.8 g/dL (ref 13.0–17.0)
MCH: 32 pg (ref 26.0–34.0)
MCHC: 35.7 g/dL (ref 30.0–36.0)
MCV: 89.7 fL (ref 80.0–100.0)
Platelets: 90 10*3/uL — ABNORMAL LOW (ref 150–400)
RBC: 4.94 MIL/uL (ref 4.22–5.81)
RDW: 13.4 % (ref 11.5–15.5)
WBC: 6 10*3/uL (ref 4.0–10.5)
nRBC: 0 % (ref 0.0–0.2)

## 2019-08-14 LAB — LIPASE, BLOOD: Lipase: 57 U/L — ABNORMAL HIGH (ref 11–51)

## 2019-08-14 LAB — MAGNESIUM: Magnesium: 2.1 mg/dL (ref 1.7–2.4)

## 2019-08-14 MED ORDER — ASPIRIN EC 81 MG PO TBEC
81.0000 mg | DELAYED_RELEASE_TABLET | Freq: Every day | ORAL | Status: DC
  Administered 2019-08-15 – 2019-08-25 (×11): 81 mg via ORAL
  Filled 2019-08-14 (×11): qty 1

## 2019-08-14 MED ORDER — ONDANSETRON HCL 4 MG PO TABS
4.0000 mg | ORAL_TABLET | Freq: Four times a day (QID) | ORAL | Status: DC | PRN
  Administered 2019-08-22 – 2019-08-23 (×2): 4 mg via ORAL
  Filled 2019-08-14 (×2): qty 1

## 2019-08-14 MED ORDER — VITAMIN D 25 MCG (1000 UNIT) PO TABS
5000.0000 [IU] | ORAL_TABLET | Freq: Every day | ORAL | Status: DC
  Administered 2019-08-15 – 2019-08-25 (×11): 5000 [IU] via ORAL
  Filled 2019-08-14 (×12): qty 5

## 2019-08-14 MED ORDER — PAROXETINE HCL 30 MG PO TABS
30.0000 mg | ORAL_TABLET | Freq: Every day | ORAL | Status: DC
  Administered 2019-08-15 – 2019-08-25 (×11): 30 mg via ORAL
  Filled 2019-08-14 (×11): qty 1

## 2019-08-14 MED ORDER — ONDANSETRON HCL 4 MG/2ML IJ SOLN
4.0000 mg | Freq: Four times a day (QID) | INTRAMUSCULAR | Status: DC | PRN
  Administered 2019-08-14: 4 mg via INTRAVENOUS
  Filled 2019-08-14: qty 2

## 2019-08-14 MED ORDER — FOLIC ACID 1 MG PO TABS
1.0000 mg | ORAL_TABLET | Freq: Every day | ORAL | Status: DC
  Administered 2019-08-14 – 2019-08-25 (×12): 1 mg via ORAL
  Filled 2019-08-14 (×12): qty 1

## 2019-08-14 MED ORDER — DILTIAZEM HCL ER COATED BEADS 120 MG PO CP24
120.0000 mg | ORAL_CAPSULE | Freq: Every day | ORAL | Status: DC
  Administered 2019-08-15 – 2019-08-25 (×11): 120 mg via ORAL
  Filled 2019-08-14 (×11): qty 1

## 2019-08-14 MED ORDER — BACLOFEN 10 MG PO TABS
10.0000 mg | ORAL_TABLET | Freq: Three times a day (TID) | ORAL | Status: DC | PRN
  Administered 2019-08-15 – 2019-08-23 (×8): 10 mg via ORAL
  Filled 2019-08-14 (×10): qty 1

## 2019-08-14 MED ORDER — THIAMINE HCL 100 MG/ML IJ SOLN
100.0000 mg | Freq: Every day | INTRAMUSCULAR | Status: DC
  Administered 2019-08-18 – 2019-08-19 (×2): 100 mg via INTRAVENOUS
  Filled 2019-08-14 (×2): qty 2

## 2019-08-14 MED ORDER — ATENOLOL 25 MG PO TABS
25.0000 mg | ORAL_TABLET | Freq: Every evening | ORAL | Status: DC
  Administered 2019-08-14 – 2019-08-25 (×11): 25 mg via ORAL
  Filled 2019-08-14 (×12): qty 1

## 2019-08-14 MED ORDER — LACTATED RINGERS IV BOLUS
1000.0000 mL | Freq: Once | INTRAVENOUS | Status: AC
  Administered 2019-08-14: 1000 mL via INTRAVENOUS

## 2019-08-14 MED ORDER — ADULT MULTIVITAMIN W/MINERALS CH
1.0000 | ORAL_TABLET | Freq: Every day | ORAL | Status: DC
  Administered 2019-08-14 – 2019-08-25 (×12): 1 via ORAL
  Filled 2019-08-14 (×12): qty 1

## 2019-08-14 MED ORDER — SODIUM CHLORIDE 0.9 % IV SOLN
INTRAVENOUS | Status: DC
  Administered 2019-08-14 – 2019-08-21 (×12): via INTRAVENOUS

## 2019-08-14 MED ORDER — VITAMIN B-1 100 MG PO TABS
100.0000 mg | ORAL_TABLET | Freq: Every day | ORAL | Status: DC
  Administered 2019-08-14 – 2019-08-17 (×5): 100 mg via ORAL
  Filled 2019-08-14 (×5): qty 1

## 2019-08-14 MED ORDER — LORAZEPAM 2 MG PO TABS
0.0000 mg | ORAL_TABLET | Freq: Two times a day (BID) | ORAL | Status: AC
  Administered 2019-08-17: 03:00:00 2 mg via ORAL
  Administered 2019-08-18: 4 mg via ORAL
  Filled 2019-08-14: qty 2
  Filled 2019-08-14: qty 1

## 2019-08-14 MED ORDER — DOXEPIN HCL 10 MG PO CAPS
10.0000 mg | ORAL_CAPSULE | Freq: Every evening | ORAL | Status: DC | PRN
  Administered 2019-08-15 – 2019-08-23 (×5): 10 mg via ORAL
  Filled 2019-08-14 (×7): qty 1

## 2019-08-14 MED ORDER — INFLUENZA VAC A&B SA ADJ QUAD 0.5 ML IM PRSY
0.5000 mL | PREFILLED_SYRINGE | INTRAMUSCULAR | Status: AC
  Administered 2019-08-15: 12:00:00 0.5 mL via INTRAMUSCULAR
  Filled 2019-08-14: qty 0.5

## 2019-08-14 MED ORDER — LORAZEPAM 2 MG PO TABS
0.0000 mg | ORAL_TABLET | Freq: Four times a day (QID) | ORAL | Status: AC
  Administered 2019-08-14: 1 mg via ORAL
  Filled 2019-08-14: qty 1

## 2019-08-14 MED ORDER — ACETAMINOPHEN 325 MG PO TABS
650.0000 mg | ORAL_TABLET | Freq: Four times a day (QID) | ORAL | Status: DC | PRN
  Administered 2019-08-16 – 2019-08-24 (×5): 650 mg via ORAL
  Filled 2019-08-14 (×7): qty 2

## 2019-08-14 MED ORDER — ACETAMINOPHEN 650 MG RE SUPP: 650.0000 mg | Freq: Four times a day (QID) | RECTAL | Status: DC | PRN

## 2019-08-14 MED ORDER — VITAMIN C 500 MG PO TABS
1000.0000 mg | ORAL_TABLET | Freq: Every day | ORAL | Status: DC
  Administered 2019-08-15 – 2019-08-25 (×11): 1000 mg via ORAL
  Filled 2019-08-14 (×11): qty 2

## 2019-08-14 MED ORDER — ONDANSETRON HCL 4 MG/2ML IJ SOLN
4.0000 mg | Freq: Once | INTRAMUSCULAR | Status: AC
  Administered 2019-08-14: 4 mg via INTRAVENOUS
  Filled 2019-08-14: qty 2

## 2019-08-14 MED ORDER — LORAZEPAM 2 MG/ML IJ SOLN
0.0000 mg | Freq: Two times a day (BID) | INTRAMUSCULAR | Status: AC
  Administered 2019-08-15 – 2019-08-17 (×2): 2 mg via INTRAVENOUS
  Filled 2019-08-14: qty 1

## 2019-08-14 MED ORDER — LORAZEPAM 2 MG/ML IJ SOLN
0.0000 mg | Freq: Four times a day (QID) | INTRAMUSCULAR | Status: AC
  Administered 2019-08-14 – 2019-08-15 (×2): 1 mg via INTRAVENOUS
  Administered 2019-08-15 – 2019-08-16 (×3): 2 mg via INTRAVENOUS
  Filled 2019-08-14 (×2): qty 1
  Filled 2019-08-14: qty 2
  Filled 2019-08-14 (×3): qty 1
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
## 2019-08-14 NOTE — ED Triage Notes (Signed)
First Nurse Note:  Arrives via [facility].  Per EMS patient has consumed 18 beers today (18 cans on the kitchen table).  Patient states he wants to stop drinking and go back to rehab.  Patient also c/o diarrhea.  Awake and alert.  Per EMS, patient has not been taking any of his medications for about one week.

## 2019-08-14 NOTE — ED Notes (Signed)
Called pharmacy about atenolol- was told they were working on it

## 2019-08-14 NOTE — ED Triage Notes (Signed)
Arrived by EMS from home for diarrhea and alcohol intoxication. C/o diarrhea X5 days. Reports he consumed "a lot" of beers today. EMS counted 18 beers on kitchen table. Patient states "I can normally control my drinking but with this diarrhea it got out of control."

## 2019-08-14 NOTE — ED Notes (Signed)
Admitting Dr at bedside
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
## 2019-08-14 NOTE — ED Notes (Signed)
Lab contacted to send phlebotomist

## 2019-08-14 NOTE — ED Notes (Signed)
Pt given meal tray.

## 2019-08-14 NOTE — Progress Notes (Signed)
Patient ID: Douglas Peterson., male   DOB: Dec 18, 1950, 68 y.o.   MRN: [id number]  ACP note  Spoke with patient at bedside Updated daughter on the phone  Patient coming in with weakness and feeling sick.  He states he been drinking heavily now going on for 3+ years.  Drinking 18 to 20 cans of beer per day.  He can even stand up.  He is developed a sore on his buttock.  He lives alone and walks with a walker.  In the ER he was found to have a low sodium and a stage II decubitus on his buttock.  Hospitalist services contacted for admission.  CODE STATUS discussed and patient wishes to be a full code.  Diagnosis alcohol abuse, hyponatremia, weakness, stage II decubitus, thrombocytopenia, elevated liver function test.  Hepatic steatosis seen on prior imaging.  Plan.  Gentle IV fluid hydration for his hyponatremia and stop beer drinking.  Watch closely with alcohol withdrawal protocol.  Local wound care for stage II decubitus.  Physical therapy evaluation for weakness.  Patient's overall prognosis is poor especially if he continues to drink upon going home.  Imaged liver to see if he has cirrhosis.  Time spent on ACP discussion 17 minutes Dr Loletha Grayer

## 2019-08-14 NOTE — ED Notes (Addendum)
Pt to treatment room by wheelchair- Elmyra Ricks and [facility] ED techs helped pt into bed- this RN notes a stage 2 pressure ulcer on pt's sacrum- barrier cream applied by [facility] ED tech- pt is laying in bed with eyes closed and door open for safety

## 2019-08-14 NOTE — ED Notes (Signed)
US at bedside

## 2019-08-15 DIAGNOSIS — L89501 Pressure ulcer of unspecified ankle, stage 1: Secondary | ICD-10-CM | POA: Insufficient documentation

## 2019-08-15 DIAGNOSIS — L8990 Pressure ulcer of unspecified site, unspecified stage: Secondary | ICD-10-CM | POA: Insufficient documentation

## 2019-08-15 LAB — BASIC METABOLIC PANEL
Anion gap: 9 (ref 5–15)
BUN: 6 mg/dL — ABNORMAL LOW (ref 8–23)
CO2: 27 mmol/L (ref 22–32)
Calcium: 8.8 mg/dL — ABNORMAL LOW (ref 8.9–10.3)
Chloride: 99 mmol/L (ref 98–111)
Creatinine, Ser: 0.42 mg/dL — ABNORMAL LOW (ref 0.61–1.24)
GFR calc Af Amer: >60 mL/min (ref >60)
GFR calc non Af Amer: >60 mL/min (ref >60)
Glucose, Bld: 84 mg/dL (ref 70–99)
Potassium: 3.7 mmol/L (ref 3.5–5.1)
Sodium: 135 mmol/L (ref 135–145)

## 2019-08-15 LAB — HEPATITIS C ANTIBODY: HCV Ab: NONREACTIVE

## 2019-08-15 LAB — CBC
HCT: 38.6 % — ABNORMAL LOW (ref 39.0–52.0)
Hemoglobin: 13.4 g/dL (ref 13.0–17.0)
MCH: 31.4 pg (ref 26.0–34.0)
MCHC: 34.7 g/dL (ref 30.0–36.0)
MCV: 90.4 fL (ref 80.0–100.0)
Platelets: 74 10*3/uL — ABNORMAL LOW (ref 150–400)
RBC: 4.27 MIL/uL (ref 4.22–5.81)
RDW: 13.3 % (ref 11.5–15.5)
WBC: 3.2 10*3/uL — ABNORMAL LOW (ref 4.0–10.5)
nRBC: 0 % (ref 0.0–0.2)

## 2019-08-15 LAB — HEPATITIS B CORE ANTIBODY, TOTAL: Hep B Core Total Ab: NONREACTIVE

## 2019-08-15 LAB — HEPATITIS B SURFACE ANTIGEN: Hepatitis B Surface Ag: NONREACTIVE

## 2019-08-15 LAB — SARS CORONAVIRUS 2 (TAT 6-24 HRS): SARS Coronavirus 2: NEGATIVE

## 2019-08-15 MED ORDER — LORAZEPAM 2 MG/ML IJ SOLN
2.0000 mg | Freq: Once | INTRAMUSCULAR | Status: AC
  Administered 2019-08-15: 17:00:00 2 mg via INTRAVENOUS

## 2019-08-15 MED ORDER — LORAZEPAM 2 MG/ML IJ SOLN
INTRAMUSCULAR | Status: AC
  Filled 2019-08-15: qty 1

## 2019-08-15 NOTE — Evaluation (Signed)
Occupational Therapy Evaluation Patient Details Name: Douglas Peterson. MRN: [id number] DOB: 12/28/1950 Today's Date: 08/15/2019    History of Present Illness presented to ER secondary to weakness, diarrhea; admitted for management of acute hyponatremia.   Clinical Impression   Douglas Peterson was seen for OT evaluation this date. Prior to hospital admission, pt reports he was independent in all ADL mgt, however, he reports he uses a 2ww for mobility on occasion. Pt lives alone and states his home is very cluttered which makes mobility difficult. He reports at least 1 fall in the past 12 months.  Currently pt demonstrates impairments in strength, coordination, and functional RUE use requiring at least moderate assistance ADL management and mobility.  Pt would benefit from skilled OT to address noted impairments and functional limitations (see below for any additional details) in order to maximize safety and independence while minimizing falls risk and caregiver burden. Upon hospital discharge, recommend STR to maximize pt safety and return to PLOF.     Follow Up Recommendations  SNF    Equipment Recommendations  Other (comment)(TBD)    Recommendations for Other Services       Precautions / Restrictions Precautions Precautions: Fall Restrictions Weight Bearing Restrictions: No      Mobility Bed Mobility Overal bed mobility: Needs Assistance Bed Mobility: Supine to Sit;Sit to Supine     Supine to sit: Min guard;Min assist Sit to supine: Mod assist   General bed mobility comments: Pt comes to sitting EOB x2 this date. Requires min A for mgt of BLEs during sup>sit mod assist for sit>sup.  Transfers Overall transfer level: Needs assistance   Transfers: Sit to/from Stand Sit to Stand: Mod assist         General transfer comment: assist for lift off, anterior weight translation.  Very heavy posterior lean/pushing, absent attempts at self-correction    Balance Overall balance  assessment: Needs assistance Sitting-balance support: Feet supported;No upper extremity supported;Single extremity supported Sitting balance-Leahy Scale: Fair Sitting balance - Comments: Pt able to sit EOB for gown change this date. Is able to go brief periods with no UE support, but generally holds onto bed rail with LUE   Standing balance support: No upper extremity supported Standing balance-Leahy Scale: Zero                             ADL either performed or assessed with clinical judgement   ADL Overall ADL's : Needs assistance/impaired                                       General ADL Comments: Pt significantly limited by tremors and RUE flexion posture. Requires min-mod assist for UB/LB adl mgt. This date is able to lace LUE through clean hospital gown, but has significant difficulty with RUE 2/2 tremors and increased tone with exertion. OT provided mod assist for UB dressing. Pt reports being able to feed himself using only his LUE. He attempts use of urinal at bed level, but has difficulty with mgt, was noted to have urine on gown/sheets this date.     Vision Baseline Vision/History: No visual deficits Patient Visual Report: No change from baseline       Perception     Praxis      Pertinent Vitals/Pain Pain Assessment: Faces Faces Pain Scale: Hurts a little bit Pain Location: Sacrum/buttocks Pain Descriptors /  Indicators: Burning;Sore;Discomfort Pain Intervention(s): Limited activity within patient's tolerance;Monitored during session;Repositioned     Hand Dominance Right   Extremity/Trunk Assessment Upper Extremity Assessment Upper Extremity Assessment: RUE deficits/detail(LUE grossly 4-/5 pt able to use LUE to boost self in bed, and to stabalize while seated. Significant tremors in BUE noted this date.) RUE Deficits / Details: RUE noted to be flexed at elbow with hand in fisted position. Tone increases with exertion this date. Pt able to  tolerate PROM, but does not attain full extension (- approx 15-20 degrees this date). Digits flexed at rest, but able to extend with PROM other than first digit remains flexted at IP joints. RUE Coordination: decreased fine motor;decreased gross motor   Lower Extremity Assessment Lower Extremity Assessment: Defer to PT evaluation;Generalized weakness(Significant tremors. Decreased with closed chain activity/proprioceptive input at knees.)       Communication Communication Communication: No difficulties   Cognition Arousal/Alertness: Awake/alert Behavior During Therapy: WFL for tasks assessed/performed Overall Cognitive Status: Within Functional Limits for tasks assessed                                 General Comments: anxious, fearful of falling   General Comments       Exercises Other Exercises Other Exercises: Sit/stand with RW, mod/max assist-unable to release R UE fist/grasp for placement on RW Other Exercises: Scoot pivot, bed/chair over level surfaces, max assist--constant cuing/assist for forward trunk lean, hand placement, lift off/lateral movement Other Exercises: Pt educated on safe positioning and ROM activities for RUE to maximize functional use and skin integrity this date. Pt also educated on falls prevention strategies for home and hospital. Other Exercises: Pt assisted with gown and bedding change this date.   Shoulder Instructions      Home Living Family/patient expects to be discharged to:: Private residence Living Arrangements: Alone Available Help at Discharge: Family;Available PRN/intermittently Type of Home: House Home Access: Stairs to enter [employer] of Steps: 2 Entrance Stairs-Rails: Right Home Layout: One level     Bathroom Shower/Tub: Teacher, early years/pre: Standard     Home Equipment: Environmental consultant - 2 wheels          Prior Functioning/Environment Level of Independence: Independent        Comments:  patient reports he only uses walker when he feels like he needs it.        OT Problem List: Decreased strength;Decreased range of motion;Decreased activity tolerance;Decreased safety awareness;Impaired balance (sitting and/or standing);Decreased knowledge of use of DME or AE;Decreased coordination      OT Treatment/Interventions: Self-care/ADL training;Balance training;Therapeutic exercise;Therapeutic activities;Energy conservation;DME and/or AE instruction;Patient/family education    OT Goals(Current goals can be found in the care plan section) Acute Rehab OT Goals Patient Stated Goal: To regain as much independence as possible OT Goal Formulation: With patient Time For Goal Achievement: 08/29/19 Potential to Achieve Goals: Good ADL Goals Pt Will Perform Grooming: with modified independence;sitting(With LRAD PRN for improved safety and functional independence) Pt Will Transfer to Toilet: ambulating;with min assist;bedside commode(With LRAD PRN for improved safety and functional independence) Pt Will Perform Toileting - Clothing Manipulation and hygiene: sit to/from stand;with min guard assist;with min assist;with adaptive equipment(With LRAD PRN for improved safety and functional independence)  OT Frequency: Min 2X/week   Barriers to D/C: Decreased caregiver support          Co-evaluation  AM-PAC OT "6 Clicks" Daily Activity     Outcome Measure Help from another person eating meals?: A Little Help from another person taking care of personal grooming?: A Little Help from another person toileting, which includes using toliet, bedpan, or urinal?: A Lot Help from another person bathing (including washing, rinsing, drying)?: A Lot Help from another person to put on and taking off regular upper body clothing?: A Lot Help from another person to put on and taking off regular lower body clothing?: A Lot 6 Click Score: 14   End of Session    Activity Tolerance: Patient  tolerated treatment well Patient left: in bed;with call bell/phone within reach;with bed alarm set  OT Visit Diagnosis: Other abnormalities of gait and mobility (R26.89);History of falling (Z91.81)                Time: 0146-0225 OT Time Calculation (min): 39 min Charges:  OT General Charges $OT Visit: 1 Visit OT Evaluation $OT Eval Moderate Complexity: 1 Mod OT Treatments $Self Care/Home Management : 23-37 mins  Shara Blazing, M.S., OTR/L Ascom: [phone] 08/15/19, 3:53 PM

## 2019-08-15 NOTE — Evaluation (Signed)
Physical Therapy Evaluation Patient Details Name: Douglas Peterson. MRN: [id number] DOB: 07-25-1951 Today's Date: 08/15/2019   History of Present Illness  presented to ER secondary to weakness, diarrhea; admitted for management of acute hyponatremia.  Clinical Impression  Upon evaluation, patient alert and oriented to basic information; follows commands and demonstrates good effort.  Generally tremulous throughout all extremities, worsened with transition to upright and exertional activities.  R UE maintained in flexed position, cradled across abdomen with fisted hand; mild/mod finger flexion contractures noted (suggests baseline function of R UE?).  Currently requiring min assist for bed mobility; mod/max assist for sit/stand and static standing balance.  Significant posterior lean/push in standing with absent attempts or ability to self-correct.  Attempted single step forward, but requires extensive (max/dep) assist to maintain balance; unsafe for further stepping/gait attempts. Did complete OOB to chair via scoot pivot over level surfaces, max assist for trunk lean/weight shift, lift off and lateral movement between seating surfaces.  May benefit from [facility] level as primary mobility until tremors resolve and balance improves.  Unsafe/unable to manage in home indep at this time. Would benefit from skilled PT to address above deficits and promote optimal return to PLOF.recommend transition to STR upon discharge from acute hospitalization.     Follow Up Recommendations SNF    Equipment Recommendations       Recommendations for Other Services       Precautions / Restrictions Precautions Precautions: Fall Restrictions Weight Bearing Restrictions: No      Mobility  Bed Mobility Overal bed mobility: Needs Assistance Bed Mobility: Supine to Sit     Supine to sit: Min assist;Min guard     General bed mobility comments: towards L  Transfers Overall transfer level: Needs assistance    Transfers: Sit to/from Stand Sit to Stand: Mod assist         General transfer comment: assist for lift off, anterior weight translation.  Very heavy posterior lean/pushing, absent attempts at self-correction  Ambulation/Gait Ambulation/Gait assistance: Total assist Gait Distance (Feet): 1 Feet         General Gait Details: attempted single step, but unable to maintain balance, max/total assist to prevent LOB/fall; unsafe to attempt further  Stairs            Wheelchair Mobility    Modified Rankin (Stroke Patients Only)       Balance Overall balance assessment: Needs assistance Sitting-balance support: No upper extremity supported;Feet supported Sitting balance-Leahy Scale: Fair     Standing balance support: No upper extremity supported Standing balance-Leahy Scale: Zero                               Pertinent Vitals/Pain Pain Assessment: No/denies pain    Home Living Family/patient expects to be discharged to:: Private residence Living Arrangements: Alone Available Help at Discharge: Family;Available PRN/intermittently Type of Home: House Home Access: Stairs to enter Entrance Stairs-Rails: Right Entrance Stairs-Number of Steps: 2 Home Layout: One level Home Equipment: Walker - 2 wheels      Prior Function Level of Independence: Independent         Comments: patient reports he only uses walker when he feels like he needs it.     Hand Dominance   Dominant Hand: Right    Extremity/Trunk Assessment   Upper Extremity Assessment Upper Extremity Assessment: (L UE grossly WFL (4-/5); R UE flexed/fisted across abdomen (mild/mod finger flexion contraction noted), lacking approx 10-15 degrees passive  R elbow extension with act assist movement.  Increased R flexor tone with exertion.  Significant tremors)    Lower Extremity Assessment Lower Extremity Assessment: (grossly 4-/5 throughout; significant tremors)       Communication    Communication: No difficulties  Cognition Arousal/Alertness: Awake/alert Behavior During Therapy: WFL for tasks assessed/performed Overall Cognitive Status: Within Functional Limits for tasks assessed                                 General Comments: anxious, fearful of falling      General Comments      Exercises Other Exercises Other Exercises: Sit/stand with RW, mod/max assist-unable to release R UE fist/grasp for placement on RW Other Exercises: Scoot pivot, bed/chair over level surfaces, max assist--constant cuing/assist for forward trunk lean, hand placement, lift off/lateral movement   Assessment/Plan    PT Assessment Patient needs continued PT services  PT Problem List Decreased strength;Decreased range of motion;Decreased activity tolerance;Decreased balance;Decreased mobility;Decreased coordination;Decreased knowledge of use of DME;Decreased safety awareness;Decreased knowledge of precautions;Cardiopulmonary status limiting activity       PT Treatment Interventions DME instruction;Gait training;Stair training;Functional mobility training;Therapeutic activities;Therapeutic exercise;Balance training;Patient/family education    PT Goals (Current goals can be found in the Care Plan section)  Acute Rehab PT Goals Patient Stated Goal: OOB to chair for lunch PT Goal Formulation: With patient Time For Goal Achievement: 08/29/19 Potential to Achieve Goals: Fair    Frequency Min 2X/week   Barriers to discharge Decreased caregiver support      Co-evaluation               AM-PAC PT "6 Clicks" Mobility  Outcome Measure Help needed turning from your back to your side while in a flat bed without using bedrails?: A Little Help needed moving from lying on your back to sitting on the side of a flat bed without using bedrails?: A Little Help needed moving to and from a bed to a chair (including a wheelchair)?: A Lot Help needed standing up from a chair using  your arms (e.g., wheelchair or bedside chair)?: A Lot Help needed to walk in hospital room?: Total Help needed climbing 3-5 steps with a railing? : Total 6 Click Score: 12    End of Session Equipment Utilized During Treatment: Gait belt Activity Tolerance: Patient tolerated treatment well(limited by tremors/anxiety) Patient left: in chair;with call bell/phone within reach;with chair alarm set Nurse Communication: Mobility status PT Visit Diagnosis: Muscle weakness (generalized) (M62.81);Difficulty in walking, not elsewhere classified (R26.2);Repeated falls (R29.6)    Time: 3419-6222 PT Time Calculation (min) (ACUTE ONLY): 20 min   Charges:   PT Evaluation $PT Eval Moderate Complexity: 1 Mod PT Treatments $Therapeutic Activity: 8-22 mins        H. Owens Shark, PT, DPT, NCS 08/15/19, 2:21 PM [phone]

## 2019-08-15 NOTE — Progress Notes (Signed)
[facility] [place] NAME: Douglas Peterson    MR#:  [id number]  DATE OF BIRTH:  Feb 09, 1951  SUBJECTIVE:  CHIEF COMPLAINT:   Chief Complaint  Patient presents with  . Alcohol Intoxication  . Diarrhea   No new complaint this morning.  Nursing staff has not noticed any other diarrhea today.  No fevers.  REVIEW OF SYSTEMS:  Review of Systems  Constitutional: Negative for chills and fever.  HENT: Negative for hearing loss and tinnitus.   Eyes: Negative for blurred vision and double vision.  Respiratory: Negative for cough, hemoptysis and sputum production.   Cardiovascular: Negative for chest pain and palpitations.  Gastrointestinal: Negative for heartburn and nausea.       No diarrhea today.  Genitourinary: Negative for dysuria and urgency.  Musculoskeletal: Negative for myalgias and neck pain.  Skin: Negative for itching and rash.  Neurological: Negative for dizziness and headaches.  Psychiatric/Behavioral: Negative for depression and hallucinations.    DRUG ALLERGIES:  No Known Allergies VITALS:  Blood pressure (!) 161/66, pulse 79, temperature 98.2 F (36.8 C), resp. rate 20, height 5' 4.5" (1.638 m), weight 58.1 kg, SpO2 96 %. PHYSICAL EXAMINATION:  Physical Exam  GENERAL:  68 y.o.-year-old patient lying in the bed with no acute distress.  EYES: Pupils equal, round, reactive to light and accommodation. No scleral icterus. Extraocular muscles intact.  HEENT: Head atraumatic, normocephalic. Oropharynx and nasopharynx clear.  NECK:  Supple, no jugular venous distention. No thyroid enlargement, no tenderness.  LUNGS: Normal breath sounds bilaterally, no wheezing, rales,rhonchi or crepitation. No use of accessory muscles of respiration.  CARDIOVASCULAR: S1, S2 normal. No murmurs, rubs, or gallops.  ABDOMEN: Soft, nontender, nondistended. Bowel sounds present. No organomegaly or mass.  EXTREMITIES: No pedal edema, cyanosis, or  clubbing.  NEUROLOGIC: Cranial nerves II through XII are intact.  Right arm weakness and limited movement of his right arm. Sensation intact. Gait not checked.  Patient able to straight leg raise bilaterally. PSYCHIATRIC: The patient is alert and oriented x 3.  SKIN: Stage II decubiti on buttock.  Most of the skin is red but there are couple points where there is some skin breakdown   LABORATORY PANEL:  Male CBC Recent Labs  Lab 08/15/19 0555  WBC 3.2*  HGB 13.4  HCT 38.6*  PLT 74*   ------------------------------------------------------------------------------------------------------------------ Chemistries  Recent Labs  Lab 08/14/19 1706 08/15/19 0555  NA 129* 135  K 3.7 3.7  CL 89* 99  CO2 24 27  GLUCOSE 90 84  BUN <5* 6*  CREATININE 0.32* 0.42*  CALCIUM 8.5* 8.8*  MG 2.1  --   AST 91*  --   ALT 54*  --   ALKPHOS 141*  --   BILITOT 0.9  --    RADIOLOGY:  US Abdomen Limited Ruq  Result Date: 08/14/2019 CLINICAL DATA:  Liver disease, possible cirrhosis EXAM: ULTRASOUND ABDOMEN LIMITED RIGHT UPPER QUADRANT COMPARISON:  Ultrasound 12/18/2016 FINDINGS: Gallbladder: No gallstones or wall thickening visualized. No sonographic Murphy sign noted by sonographer. Common bile duct: Diameter: 4.2 mm Liver: Limited visualization of the liver secondary to bowel gas. Coarse echogenic pattern without gross focal hepatic abnormality. Portal vein is patent on color Doppler imaging with normal direction of blood flow towards the liver. Other: None. IMPRESSION: 1. Difficult visualization of the liver secondary to bowel gas. Liver appears coarse and echogenic with suggestion of subtle contour nodularity on some of the images as may be seen with cirrhosis.  2. Negative for gallstones Electronically Signed   By: Donavan Foil M.D.   On: 08/14/2019 21:10   ASSESSMENT AND PLAN:   1.  Hyponatremia with beer put to mania. Sodium level improved from 1 29-1 35 with IV fluids. 2.  Alcohol abuse.   Monitor closely for withdrawal.  Give IV thiamine.  Alcohol withdrawal protocol.  Admitting physician spoke with the patient's daughter on the phone and she states that he gets well enough to go home and start drinking again. The patient states that he is willing to stop drinking.  Overall prognosis is poor [place] if he continues to drink alcohol. 3.  Weakness.    Patient seen by physical therapist.  Skilled nursing facility placement recommended.   4.  Stage II decubitus on buttock.  Local wound care. 5.  Elevated liver function test, thrombocytopenia ; most likely related to alcohol abuse  follow-up hepatitis panel.  Liver ultrasound reviewed liver appears coarse and echogenic with suggestion of subtle contour nodularity on some of the images as may be seen with cirrhosis. 6.  Hypertension on atenolol and Cardizem 7.  Depression anxiety on Paxil  DVT prophylaxis; SCDs No heparin products due to thrombocytopenia   All the records are reviewed and case discussed with Care Management/Social Worker. Management plans discussed with the patient, and he is in in agreement. Patient will require skilled nursing facility placement on discharge  CODE STATUS: Full Code  TOTAL TIME TAKING CARE OF THIS PATIENT: 33 minutes.   More than 50% of the time was spent in counseling/coordination of care: YES  POSSIBLE D/C IN 2 DAYS, DEPENDING ON CLINICAL CONDITION.     M.D on 08/15/2019 at 2:48 PM  Between 7am to 6pm - Pager - [phone]  After 6pm go to [email or web address] - Technical brewer [facility]  Office  [phone]  CC: Primary care physician; [name], MD  Note: This dictation was prepared with Dragon dictation along with smaller phrase technology. Any transcriptional errors that result from this process are unintentional.

## 2019-08-15 NOTE — Progress Notes (Signed)
NP Ouma made aware that despite ativan that pt has received his is still experiencing whole body tremors and sweating profusely, ativan having little effect on pt, NP Ouma to place orders

## 2019-08-15 NOTE — Progress Notes (Signed)
Pt has calmed down now, resting, VSS

## 2019-08-16 LAB — COMPREHENSIVE METABOLIC PANEL
ALT: 42 U/L (ref 0–44)
AST: 66 U/L — ABNORMAL HIGH (ref 15–41)
Albumin: 4.2 g/dL (ref 3.5–5.0)
Alkaline Phosphatase: 115 U/L (ref 38–126)
Anion gap: 13 (ref 5–15)
BUN: 8 mg/dL (ref 8–23)
CO2: 24 mmol/L (ref 22–32)
Calcium: 8.9 mg/dL (ref 8.9–10.3)
Chloride: 102 mmol/L (ref 98–111)
Creatinine, Ser: 0.49 mg/dL — ABNORMAL LOW (ref 0.61–1.24)
GFR calc Af Amer: >60 mL/min (ref >60)
GFR calc non Af Amer: >60 mL/min (ref >60)
Glucose, Bld: 94 mg/dL (ref 70–99)
Potassium: 3.4 mmol/L — ABNORMAL LOW (ref 3.5–5.1)
Sodium: 139 mmol/L (ref 135–145)
Total Bilirubin: 1.6 mg/dL — ABNORMAL HIGH (ref 0.3–1.2)
Total Protein: 7 g/dL (ref 6.5–8.1)

## 2019-08-16 LAB — CBC
HCT: 40.3 % (ref 39.0–52.0)
Hemoglobin: 13.8 g/dL (ref 13.0–17.0)
MCH: 31.8 pg (ref 26.0–34.0)
MCHC: 34.2 g/dL (ref 30.0–36.0)
MCV: 92.9 fL (ref 80.0–100.0)
Platelets: 77 10*3/uL — ABNORMAL LOW (ref 150–400)
RBC: 4.34 MIL/uL (ref 4.22–5.81)
RDW: 13.9 % (ref 11.5–15.5)
WBC: 6.3 10*3/uL (ref 4.0–10.5)
nRBC: 0 % (ref 0.0–0.2)

## 2019-08-16 LAB — HEPATITIS B SURFACE ANTIBODY, QUANTITATIVE: Hep B S AB Quant (Post): <3.1 m[IU]/mL — ABNORMAL LOW (ref >9.9)

## 2019-08-16 LAB — MAGNESIUM: Magnesium: 2.1 mg/dL (ref 1.7–2.4)

## 2019-08-16 MED ORDER — POTASSIUM CHLORIDE CRYS ER 20 MEQ PO TBCR
40.0000 meq | EXTENDED_RELEASE_TABLET | Freq: Once | ORAL | Status: AC
  Administered 2019-08-16: 13:00:00 40 meq via ORAL
  Filled 2019-08-16: qty 2

## 2019-08-16 NOTE — Progress Notes (Signed)
Douglas Peterson at [place] NAME: Douglas Peterson    MR#:  [id number]  DATE OF BIRTH:  1951-07-13  SUBJECTIVE:  CHIEF COMPLAINT:   Chief Complaint  Patient presents with  . Alcohol Intoxication  . Diarrhea   No new complaint this morning.  Patient was given an extra dose of Ativan yesterday evening.  Resting comfortably this morning.    REVIEW OF SYSTEMS:  Review of Systems  Constitutional: Negative for chills and fever.  HENT: Negative for hearing loss and tinnitus.   Eyes: Negative for blurred vision and double vision.  Respiratory: Negative for cough, hemoptysis and sputum production.   Cardiovascular: Negative for chest pain and palpitations.  Gastrointestinal: Negative for heartburn and nausea.       No diarrhea today.  Genitourinary: Negative for dysuria and urgency.  Musculoskeletal: Negative for myalgias and neck pain.  Skin: Negative for itching and rash.  Neurological: Negative for dizziness and headaches.  Psychiatric/Behavioral: Negative for depression and hallucinations.    DRUG ALLERGIES:  No Known Allergies VITALS:  Blood pressure 119/75, pulse 76, temperature 97.6 F (36.4 C), temperature source Oral, resp. rate 18, height 5' 4.5" (1.638 m), weight 58.1 kg, SpO2 98 %. PHYSICAL EXAMINATION:  Physical Exam  GENERAL:  68 y.o.-year-old patient lying in the bed with no acute distress.  EYES: Pupils equal, round, reactive to light and accommodation. No scleral icterus. Extraocular muscles intact.  HEENT: Head atraumatic, normocephalic. Oropharynx and nasopharynx clear.  NECK:  Supple, no jugular venous distention. No thyroid enlargement, no tenderness.  LUNGS: Normal breath sounds bilaterally, no wheezing, rales,rhonchi or crepitation. No use of accessory muscles of respiration.  CARDIOVASCULAR: S1, S2 normal. No murmurs, rubs, or gallops.  ABDOMEN: Soft, nontender, nondistended. Bowel sounds present. No organomegaly or mass.   EXTREMITIES: No pedal edema, cyanosis, or clubbing.  NEUROLOGIC: Cranial nerves II through XII are intact.  Right arm weakness and limited movement of his right arm. Sensation intact. Gait not checked.  Patient able to straight leg raise bilaterally. PSYCHIATRIC: The patient is alert and oriented x 3.  SKIN: Stage II decubiti on buttock.  Most of the skin is red but there are couple points where there is some skin breakdown   LABORATORY PANEL:  Male CBC Recent Labs  Lab 08/16/19 0603  WBC 6.3  HGB 13.8  HCT 40.3  PLT 77*   ------------------------------------------------------------------------------------------------------------------ Chemistries  Recent Labs  Lab 08/16/19 0603  NA 139  K 3.4*  CL 102  CO2 24  GLUCOSE 94  BUN 8  CREATININE 0.49*  CALCIUM 8.9  MG 2.1  AST 66*  ALT 42  ALKPHOS 115  BILITOT 1.6*   RADIOLOGY:  No results found. ASSESSMENT AND PLAN:   1.  Hyponatremia with beer put to mania. Sodium level improved from 129-139 with IV fluids. 2.  Alcohol abuse now with alcohol withdrawal Continue CIWA protocol.  Continue thiamine, multivitamin and folate acid.  Admitting physician spoke with the patient's daughter on the phone and she states that he gets well enough to go home and start drinking again. The patient states that he is willing to stop drinking.  Overall prognosis is poor [place] if he continues to drink alcohol. 3.  Weakness.    Patient seen by physical therapist.  Skilled nursing facility placement recommended.   4.  Stage II decubitus on buttock.  Local wound care. 5.  Elevated liver function test, thrombocytopenia ; most likely related to alcohol abuse  follow-up hepatitis panel.  Liver ultrasound reviewed liver appears coarse and echogenic with suggestion of subtle contour nodularity on some of the images as may be seen with cirrhosis. 6.  Hypertension on atenolol and Cardizem 7.  Depression anxiety on Paxil  DVT prophylaxis; SCDs No  heparin products due to thrombocytopenia   All the records are reviewed and case discussed with Care Management/Social Worker. Management plans discussed with the patient, and he is in in agreement. Patient will require skilled nursing facility placement on discharge  CODE STATUS: Full Code  TOTAL TIME TAKING CARE OF THIS PATIENT: 32 minutes.   More than 50% of the time was spent in counseling/coordination of care: YES  POSSIBLE D/C IN 2 DAYS, DEPENDING ON CLINICAL CONDITION.     M.D on 08/16/2019 at 2:25 PM  Between 7am to 6pm - Pager - [phone]  After 6pm go to [email or web address] - Technical brewer  Hospitalists  Office  [phone]  CC: Primary care physician; [name], MD  Note: This dictation was prepared with Dragon dictation along with smaller phrase technology. Any transcriptional errors that result from this process are unintentional.

## 2019-08-17 LAB — BASIC METABOLIC PANEL
Anion gap: 9 (ref 5–15)
BUN: 9 mg/dL (ref 8–23)
CO2: 23 mmol/L (ref 22–32)
Calcium: 8.8 mg/dL — ABNORMAL LOW (ref 8.9–10.3)
Chloride: 110 mmol/L (ref 98–111)
Creatinine, Ser: 0.45 mg/dL — ABNORMAL LOW (ref 0.61–1.24)
GFR calc Af Amer: >60 mL/min (ref >60)
GFR calc non Af Amer: >60 mL/min (ref >60)
Glucose, Bld: 100 mg/dL — ABNORMAL HIGH (ref 70–99)
Potassium: 3.6 mmol/L (ref 3.5–5.1)
Sodium: 142 mmol/L (ref 135–145)

## 2019-08-17 LAB — CBC
HCT: 39.1 % (ref 39.0–52.0)
Hemoglobin: 13.2 g/dL (ref 13.0–17.0)
MCH: 32 pg (ref 26.0–34.0)
MCHC: 33.8 g/dL (ref 30.0–36.0)
MCV: 94.9 fL (ref 80.0–100.0)
Platelets: 71 10*3/uL — ABNORMAL LOW (ref 150–400)
RBC: 4.12 MIL/uL — ABNORMAL LOW (ref 4.22–5.81)
RDW: 13.8 % (ref 11.5–15.5)
WBC: 3 10*3/uL — ABNORMAL LOW (ref 4.0–10.5)
nRBC: 0 % (ref 0.0–0.2)

## 2019-08-17 LAB — MAGNESIUM: Magnesium: 2 mg/dL (ref 1.7–2.4)

## 2019-08-17 MED ORDER — THIAMINE HCL 100 MG PO TABS: 100.0000 mg | ORAL_TABLET | Freq: Every day | ORAL | Status: DC

## 2019-08-17 NOTE — NC FL2 (Signed)
[place] LEVEL OF CARE SCREENING TOOL     IDENTIFICATION  Patient Name: Douglas Peterson. Birthdate: 1951/03/16 Sex: male Admission Date (Current Location): 08/14/2019  [place] and [place] Number:  Engineering geologist and Address:  [facility], [address] [place], [place] [address]      Provider Number: [id number]  Attending Physician Name and Address:  Bettey Costa, MD  Relative Name and Phone Number:       Current Level of Care: Hospital Recommended Level of Care: [place] Prior Approval Number:    Date Approved/Denied:   PASRR Number: [id number]  Discharge Plan: SNF    Current Diagnoses: Patient Active Problem List   Diagnosis Date Noted  . Pressure injury of skin 08/15/2019  . Goals of care, counseling/discussion   . Palliative care by specialist   . Alcohol withdrawal ([place]) 03/20/2019  . Low back pain 11/15/2018  . AMS (altered mental status) 10/12/2018  . Delirium tremens ([place]) 03/08/2018  . Malnutrition of moderate degree 11/24/2017  . HTN (hypertension) 09/16/2017  . Hypothermia 12/17/2016  . Alcohol use   . Diarrhea   . Hyponatremia 07/09/2015    Orientation RESPIRATION BLADDER Height & Weight     Self, Time, Situation, Place(Fluctuates)  Normal Incontinent Weight: 128 lb (58.1 kg) Height:  5' 4.5" (163.8 cm)  BEHAVIORAL SYMPTOMS/MOOD NEUROLOGICAL BOWEL NUTRITION STATUS  (None)   Continent Diet(Regular)  AMBULATORY STATUS COMMUNICATION OF NEEDS Skin   Extensive Assist Verbally Bruising, Other (Comment), PU Stage and Appropriate Care(MASD.)   PU Stage 2 Dressing: (Mid sacrum: Foam.)                   Personal Care Assistance Level of Assistance  Bathing, Feeding, Dressing Bathing Assistance: Maximum assistance Feeding assistance: Limited assistance Dressing Assistance: Maximum assistance     Functional Limitations Info  Sight, Hearing, Speech Sight Info: Adequate Hearing  Info: Adequate Speech Info: Adequate    SPECIAL CARE FACTORS FREQUENCY  PT (By licensed PT), OT (By licensed OT)     PT Frequency: 5 x week OT Frequency: 5 x week            Contractures Contractures Info: Not present    Additional Factors Info  Code Status, Allergies Code Status Info: Full code Allergies Info: NKDA           Current Medications (08/17/2019):  This is the current hospital active medication list Current Facility-Administered Medications  Medication Dose Route Frequency Provider Last Rate Last Dose  . 0.9 %  sodium chloride infusion   Intravenous Continuous Loletha Grayer, MD 50 mL/hr at 08/17/19 1146    . acetaminophen (TYLENOL) tablet 650 mg  650 mg Oral Q6H PRN Loletha Grayer, MD   650 mg at 08/16/19 1815   Or  . acetaminophen (TYLENOL) suppository 650 mg  650 mg Rectal Q6H PRN Loletha Grayer, MD      . aspirin EC tablet 81 mg  81 mg Oral Daily Loletha Grayer, MD   81 mg at 08/17/19 0928  . atenolol (TENORMIN) tablet 25 mg  25 mg Oral QPM Loletha Grayer, MD   25 mg at 08/16/19 1815  . baclofen (LIORESAL) tablet 10 mg  10 mg Oral TID PRN Loletha Grayer, MD   10 mg at 08/16/19 1245  . cholecalciferol (VITAMIN D3) tablet 5,000 Units  5,000 Units Oral Daily Loletha Grayer, MD   5,000 Units at 08/17/19 [phone]  . diltiazem (CARDIZEM CD) 24 hr capsule  120 mg  120 mg Oral Daily Loletha Grayer, MD   120 mg at 08/17/19 9037  . doxepin (SINEQUAN) capsule 10 mg  10 mg Oral QHS PRN Loletha Grayer, MD   10 mg at 08/15/19 2201  . folic acid (FOLVITE) tablet 1 mg  1 mg Oral Daily Loletha Grayer, MD   1 mg at 08/17/19 0929  . LORazepam (ATIVAN) injection 0-4 mg  0-4 mg Intravenous Q12H Loletha Grayer, MD   2 mg at 08/17/19 1437   Or  . LORazepam (ATIVAN) tablet 0-4 mg  0-4 mg Oral Q12H Loletha Grayer, MD   2 mg at 08/17/19 0249  . multivitamin with minerals tablet 1 tablet  1 tablet Oral Daily Loletha Grayer, MD   1 tablet at 08/17/19 [phone]  .  ondansetron (ZOFRAN) tablet 4 mg  4 mg Oral Q6H PRN Loletha Grayer, MD       Or  . ondansetron [facility]) injection 4 mg  4 mg Intravenous Q6H PRN Loletha Grayer, MD   4 mg at 08/14/19 2358  . PARoxetine (PAXIL) tablet 30 mg  30 mg Oral Daily Loletha Grayer, MD   30 mg at 08/17/19 0928  . thiamine (VITAMIN B-1) tablet 100 mg  100 mg Oral Daily Loletha Grayer, MD   100 mg at 08/17/19 3167   Or  . thiamine (B-1) injection 100 mg  100 mg Intravenous Daily Wieting, Richard, MD      . vitamin C (ASCORBIC ACID) tablet 1,000 mg  1,000 mg Oral Daily Loletha Grayer, MD   1,000 mg at 08/17/19 4255     Discharge Medications: Please see discharge summary for a list of discharge medications.  Relevant Imaging Results:  Relevant Lab Results:   Additional Information SS#: [id number]  Candie Chroman, LCSW

## 2019-08-17 NOTE — Progress Notes (Signed)
Physical Therapy Treatment Patient Details Name: Douglas Peterson. MRN: [id number] DOB: Mar 14, 1951 Today's Date: 08/17/2019    History of Present Illness presented to ER secondary to weakness, diarrhea; admitted for management of acute hyponatremia.    PT Comments    Pt agreeable to PT; denies pain. Pt demonstrates mild confusion/decreased awareness throughout session. Pt noted to have significant tremors in RUE this session. Max A to don gown/shoes. Min A supine to sit and increased time to gain sitting balance without assist. STS with Mod A/Max A. Requires Max A to place RUE onto rolling walker due to clenched hand. Max A to maintain stand with heavy posterior lean pulling rolling walker wheels off ground and heavy lean on bed; several attempts without improvement. Mod A to return to bed. Spoke with SW regarding session. Continue to recommend discharge to skilled nursing facility; pt is not safe to go home. Continue PT to progress strength, endurance and balance to improve all functional mobility.     Follow Up Recommendations  SNF     Equipment Recommendations       Recommendations for Other Services       Precautions / Restrictions Precautions Precautions: Fall Precaution Comments: Balance poor/zero when tremors increase Restrictions Weight Bearing Restrictions: No    Mobility  Bed Mobility Overal bed mobility: Needs Assistance Bed Mobility: Supine to Sit;Sit to Supine     Supine to sit: Min assist Sit to supine: Mod assist   General bed mobility comments: Significant tremors in RUE   Transfers Overall transfer level: Needs assistance Equipment used: Rolling walker (2 wheeled) Transfers: Sit to/from Stand Sit to Stand: Mod assist;Max assist         General transfer comment: Several attempts. Mod to stand; Max to place LUE on/off rw. Max to maintain stand with heavy posterior lean  Ambulation/Gait             General Gait Details: unable   Stairs              Wheelchair Mobility    Modified Rankin (Stroke Patients Only)       Balance Overall balance assessment: Needs assistance Sitting-balance support: Single extremity supported;Feet supported Sitting balance-Leahy Scale: Fair Sitting balance - Comments: Pt static sits EOB while gait belt applied, some instances of posterior lean in static sitting requiring tactile and verbal cue for righting.   Standing balance support: Bilateral upper extremity supported Standing balance-Leahy Scale: Zero Standing balance comment: OT engages pt in static stand with FWW with pt requiring MOD A for actual standing balance, and MAX A to place less fxl R UE on walker handle and abduct thumb to slightly grasp. Heavy UE use through FWW and heavy posterior lean requiring assist continuously.                            Cognition Arousal/Alertness: Awake/alert Behavior During Therapy: WFL for tasks assessed/performed Overall Cognitive Status: Difficult to assess                                 General Comments: Decreased awareness this session: states he is not in hospital, states he drank too much today. Time/date awareness; decreased situational awareness. Decreased safety awareness      Exercises Other Exercises Other Exercises: OT engages pt in education re: discharge recommendations, safety considerations, fall prevention. Pt verbalized understanding and ultimately becomes agreeable to  SNF/SAR d/c.    General Comments        Pertinent Vitals/Pain Pain Assessment: No/denies pain Faces Pain Scale: Hurts a little bit Pain Location: Sacrum/buttocks Pain Descriptors / Indicators: Sore Pain Intervention(s): Limited activity within patient's tolerance;Monitored during session    Home Living                      Prior Function            PT Goals (current goals can now be found in the care plan section) Acute Rehab PT Goals Patient Stated Goal: To  regain as much independence as possible Progress towards PT goals: Progressing toward goals    Frequency    Min 2X/week      PT Plan Current plan remains appropriate    Co-evaluation              AM-PAC PT "6 Clicks" Mobility   Outcome Measure  Help needed turning from your back to your side while in a flat bed without using bedrails?: A Little Help needed moving from lying on your back to sitting on the side of a flat bed without using bedrails?: A Little Help needed moving to and from a bed to a chair (including a wheelchair)?: Total Help needed standing up from a chair using your arms (e.g., wheelchair or bedside chair)?: A Lot Help needed to walk in hospital room?: Total Help needed climbing 3-5 steps with a railing? : Total 6 Click Score: 11    End of Session Equipment Utilized During Treatment: Gait belt Activity Tolerance: Other (comment)(tremors/zero balance) Patient left: in bed;with call bell/phone within reach;with bed alarm set   PT Visit Diagnosis: Muscle weakness (generalized) (M62.81);Difficulty in walking, not elsewhere classified (R26.2);Repeated falls (R29.6)     Time: 8270-7867 PT Time Calculation (min) (ACUTE ONLY): 38 min  Charges:  $Therapeutic Activity: 38-52 mins                      Larae Grooms, PTA 08/17/2019, 5:06 PM

## 2019-08-17 NOTE — Care Management Important Message (Signed)
Important Message  Patient Details  Name: Douglas Peterson. MRN: [id number] Date of Birth: 28-Sep-1951   Medicare Important Message Given:  Yes     [name] 08/17/2019, 11:03 AM

## 2019-08-17 NOTE — Progress Notes (Signed)
Pt showing signs of increasing anxiety. Multiple attempts to get out of bed. Asking for his keys and to be taken to his truck. Pt reoriented to point he is in hospital for treatment. Attempts to get out of bed continue.

## 2019-08-17 NOTE — TOC Initial Note (Addendum)
Transition of Care (TOC) - Initial/Assessment Note    Patient Details  Name: Douglas Peterson. MRN: [id number] Date of Birth: 19-Mar-1951  Transition of Care [facility]) CM/SW Contact:    Candie Chroman, LCSW Phone Number: 08/17/2019, 3:08 PM  Clinical Narrative: CSW met with patient. No supports at bedside. CSW introduced role and explained that PT recommendations would be discussed. [name] not interested in SNF placement. He prefers to return home at discharge and resume services with [facility] Patient currently is active with PT and OT. CSW asked [facility] representative to add social work in case patient ends up needing placement from home. Patient has a cane and walker at home. He stated he goes to [employer] and was going to [employer] as well before it closed due to [employer]. Patient says family is available to assist at times. OT will try to see him again this afternoon. Also sent message to PT with request to see again. Discussed patient's ETOH use. Patient reports "a few" beers daily but confirms it is a bad habit. He is agreeable to CSW sending treatment resources home with him. No further concerns. CSW encouraged patient to contact CSW as needed. CSW will continue to follow patient for support and facilitate discharge when disposition location confirmed.         4:40 pm: Met with OT after today's session. Patient was not able to stand on his own. OT had discussion with patient about SNF and he is now agreeable. CSW sent out referral.        Expected Discharge Plan: [place]     Patient Goals and CMS Choice     Choice offered to / list presented to : NA  Expected Discharge Plan and Services Expected Discharge Plan: [place] Choice: [place] arrangements for the past 2 months: Single Family Home Expected Discharge Date: 08/17/19                         HH Arranged: PT, OT, Social Work [employer] Agency:  [facility] Date [place]: 08/17/19   Representative spoke with at [place]: [place]  Prior Living Arrangements/Services Living arrangements for the past 2 months: [place] with:: Self Patient language and need for interpreter reviewed:: Yes Do you feel safe going back to the place where you live?: Yes      Need for Family Participation in Patient Care: Yes (Comment) Care giver support system in place?: Yes (comment) Current home services: DME, Home OT, Home PT Criminal Activity/Legal Involvement Pertinent to Current Situation/Hospitalization: No - Comment as needed  Activities of Daily Living Home Assistive Devices/Equipment: Cane (specify quad or straight), Walker (specify type) ADL Screening (condition at time of admission) Patient's cognitive ability adequate to safely complete daily activities?: Yes Is the patient deaf or have difficulty hearing?: No Does the patient have difficulty seeing, even when wearing glasses/contacts?: No Does the patient have difficulty concentrating, remembering, or making decisions?: No Patient able to express need for assistance with ADLs?: Yes Does the patient have difficulty dressing or bathing?: No Independently performs ADLs?: No Communication: Independent Dressing (OT): Needs assistance Is this a change from baseline?: Change from baseline, expected to last >3 days Grooming: Independent Feeding: Independent Bathing: Needs assistance Is this a change from baseline?: Change from baseline, expected to last >3 days Toileting: Needs assistance Is this a  change from baseline?: Change from baseline, expected to last >3days In/Out Bed: Needs assistance Is this a change from baseline?: Change from baseline, expected to last >3 days Walks in Home: Needs assistance Is this a change from baseline?: Change from baseline, expected to last >3 days Does the patient have difficulty walking or climbing stairs?: Yes Weakness  of Legs: Both Weakness of Arms/Hands: Right  Permission Sought/Granted Permission sought to share information with : Chartered certified accountant granted to share information with : Yes, Verbal Permission Granted     Permission granted to share info w AGENCY: [facility]        Emotional Assessment Appearance:: Appears stated age Attitude/Demeanor/Rapport: Engaged, Gracious Affect (typically observed): Appropriate, Calm, Pleasant Orientation: : Oriented to Self, Oriented to Place, Oriented to  Time, Oriented to Situation Alcohol / Substance Use: Alcohol Use Psych Involvement: No (comment)  Admission diagnosis:  Alcohol abuse [F10.10] Hyponatremia [E87.1] 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 Pressure injury of skin 08/15/2019  . Goals of care, counseling/discussion   . Palliative care by specialist   . Alcohol withdrawal ([place]) 03/20/2019  . Low back pain 11/15/2018  . AMS (altered mental status) 10/12/2018  . Delirium tremens ([place]) 03/08/2018  . Malnutrition of moderate degree 11/24/2017  . HTN (hypertension) 09/16/2017  . Hypothermia 12/17/2016  . Alcohol use   . Diarrhea   . Hyponatremia 07/09/2015   PCP:  [name], MD Pharmacy:   [place], [place] - [place] [address] [place] [address] Phone: [phone] Fax: [phone]     Social Determinants of Health (SDOH) Interventions    Readmission Risk Interventions No flowsheet data found.
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
## 2019-08-17 NOTE — Progress Notes (Signed)
Occupational Therapy Treatment Patient Details Name: Douglas Peterson. MRN: [id number] DOB: 03-26-1951 Today's Date: 08/17/2019    History of present illness presented to ER secondary to weakness, diarrhea; admitted for management of acute hyponatremia.   OT comments  Pt seen by OT this date to address ADL mobility and self care performance. OT engages pt in bed mobility to EOB with MIN A to manage UB. While seated EOB, pt demos F- static sitting balance, requiring L UE support to maintain balance and intermittent MIN A d/t posterior lean. Pt participates in 3 standing trials requiring MOD A on each trial and demonstrating significant posterior lean. Pt only tolerates ~15-20 seconds per trial demonstrating significantly decreased standing tolerance as well. Seated EOB, OT engages pt in LB dressing to doff/don bilateral socks, pt requires increased time and MIN A to don socks. Pt demos general deconditioning and weakness of trunk and extremities. At this time based on low fxl activity tolerance and decreased strength for basic ADL transfers and self care, SNF remains most appropriate d/c disposition.    Follow Up Recommendations  SNF    Equipment Recommendations  Other (comment)(defer to next level of care.)    Recommendations for Other Services      Precautions / Restrictions Precautions Precautions: Fall Restrictions Weight Bearing Restrictions: No       Mobility Bed Mobility Overal bed mobility: Needs Assistance Bed Mobility: Supine to Sit     Supine to sit: Min assist        Transfers Overall transfer level: Needs assistance Equipment used: Rolling walker (2 wheeled) Transfers: Sit to/from Stand Sit to Stand: Mod assist         General transfer comment: pt with significant posterior lean, demos minimally flexed hips and knees and cannot come to fully extended stand, requires assistance at all times in static standing.    Balance Overall balance assessment: Needs  assistance Sitting-balance support: Feet supported;No upper extremity supported;Single extremity supported Sitting balance-Leahy Scale: Fair Sitting balance - Comments: Pt static sits EOB while gait belt applied, some instances of posterior lean in static sitting requiring tactile and verbal cue for righting.   Standing balance support: Bilateral upper extremity supported Standing balance-Leahy Scale: Poor Standing balance comment: OT engages pt in static stand with FWW with pt requiring MOD A for actual standing balance, and MAX A to place less fxl R UE on walker handle and abduct thumb to slightly grasp. Heavy UE use through FWW and heavy posterior lean requiring assist continuously.                           ADL either performed or assessed with clinical judgement   ADL Overall ADL's : Needs assistance/impaired             Lower Body Bathing: Minimal assistance;Sitting/lateral leans Lower Body Bathing Details (indicate cue type and reason): seated EOB to don b/l socks, requires MIN A and increased time. Based on standing and transfers requiring MOD A, anticipate higher level clothing mgt over hips would requiring increased assistance as well.                        General ADL Comments: Fxl mobility not performed on tx d/t significant posterior lean in static standing, OT engages pt in MIP x5 bilaterally, and pt requires MOD A for weight shift.     Vision Baseline Vision/History: No visual deficits Patient Visual Report: No  change from baseline     Perception     Praxis      Cognition Arousal/Alertness: Awake/alert Behavior During Therapy: WFL for tasks assessed/performed Overall Cognitive Status: Within Functional Limits for tasks assessed                                 General Comments: pt with some decreased safety awareness/lack of insight into deficits, but is ultimately agreeable that his standing/transfer status is not safe enough  to go home I'ly and he is agreeable to OT's recommendation of SNF.        Exercises Other Exercises Other Exercises: OT engages pt in education re: discharge recommendations, safety considerations, fall prevention. Pt verbalized understanding and ultimately becomes agreeable to SNF/SAR d/c.   Shoulder Instructions       General Comments      Pertinent Vitals/ Pain       Pain Assessment: Faces Faces Pain Scale: Hurts a little bit Pain Location: Sacrum/buttocks Pain Descriptors / Indicators: Sore Pain Intervention(s): Limited activity within patient's tolerance;Monitored during session  Home Living                                          Prior Functioning/Environment              Frequency  Min 2X/week        Progress Toward Goals  OT Goals(current goals can now be found in the care plan section)  Progress towards OT goals: Progressing toward goals  Acute Rehab OT Goals Patient Stated Goal: To regain as much independence as possible OT Goal Formulation: With patient Time For Goal Achievement: 08/29/19 Potential to Achieve Goals: Good  Plan Discharge plan remains appropriate    Co-evaluation                 AM-PAC OT "6 Clicks" Daily Activity     Outcome Measure   Help from another person eating meals?: A Little Help from another person taking care of personal grooming?: A Little Help from another person toileting, which includes using toliet, bedpan, or urinal?: A Lot Help from another person bathing (including washing, rinsing, drying)?: A Lot Help from another person to put on and taking off regular upper body clothing?: A Lot Help from another person to put on and taking off regular lower body clothing?: A Lot 6 Click Score: 14    End of Session Equipment Utilized During Treatment: Gait belt;Rolling walker  OT Visit Diagnosis: Other abnormalities of gait and mobility (R26.89);History of falling (Z91.81)   Activity Tolerance  Patient tolerated treatment well   Patient Left with call bell/phone within reach;with nursing/sitter in room(Pt left sitting EOB with nurse present removing IV as the IV dressing appears to be saturated.)   Nurse Communication Mobility status;Other (comment)(discharge disposition)        Time: 0350-0938 OT Time Calculation (min): 25 min  Charges: OT General Charges $OT Visit: 1 Visit OT Treatments $Self Care/Home Management : 8-22 mins $Therapeutic Activity: 8-22 mins  Gerrianne Scale, MS, OTR/L ascom [phone] or [phone] 08/17/19, 4:51 PM

## 2019-08-18 LAB — COMPREHENSIVE METABOLIC PANEL
ALT: 46 U/L — ABNORMAL HIGH (ref 0–44)
AST: 66 U/L — ABNORMAL HIGH (ref 15–41)
Albumin: 3.9 g/dL (ref 3.5–5.0)
Alkaline Phosphatase: 98 U/L (ref 38–126)
Anion gap: 10 (ref 5–15)
BUN: 6 mg/dL — ABNORMAL LOW (ref 8–23)
CO2: 26 mmol/L (ref 22–32)
Calcium: 8.8 mg/dL — ABNORMAL LOW (ref 8.9–10.3)
Chloride: 101 mmol/L (ref 98–111)
Creatinine, Ser: 0.39 mg/dL — ABNORMAL LOW (ref 0.61–1.24)
GFR calc Af Amer: >60 mL/min (ref >60)
GFR calc non Af Amer: >60 mL/min (ref >60)
Glucose, Bld: 96 mg/dL (ref 70–99)
Potassium: 3.5 mmol/L (ref 3.5–5.1)
Sodium: 137 mmol/L (ref 135–145)
Total Bilirubin: 1.1 mg/dL (ref 0.3–1.2)
Total Protein: 6.7 g/dL (ref 6.5–8.1)

## 2019-08-18 LAB — CBC
HCT: 40 % (ref 39.0–52.0)
Hemoglobin: 13.6 g/dL (ref 13.0–17.0)
MCH: 31.9 pg (ref 26.0–34.0)
MCHC: 34 g/dL (ref 30.0–36.0)
MCV: 93.9 fL (ref 80.0–100.0)
Platelets: 92 10*3/uL — ABNORMAL LOW (ref 150–400)
RBC: 4.26 MIL/uL (ref 4.22–5.81)
RDW: 13.8 % (ref 11.5–15.5)
WBC: 4.1 10*3/uL (ref 4.0–10.5)
nRBC: 0 % (ref 0.0–0.2)

## 2019-08-18 LAB — MAGNESIUM: Magnesium: 1.7 mg/dL (ref 1.7–2.4)

## 2019-08-18 LAB — PHOSPHORUS: Phosphorus: 3.5 mg/dL (ref 2.5–4.6)

## 2019-08-18 MED ORDER — LORAZEPAM 2 MG/ML IJ SOLN
2.0000 mg | Freq: Once | INTRAMUSCULAR | Status: AC
  Administered 2019-08-18: 04:00:00 2 mg via INTRAVENOUS
  Filled 2019-08-18: qty 1

## 2019-08-18 MED ORDER — LORAZEPAM 2 MG/ML IJ SOLN
0.0000 mg | INTRAMUSCULAR | Status: AC
  Administered 2019-08-18 (×2): 3 mg via INTRAVENOUS
  Administered 2019-08-19: 2 mg via INTRAVENOUS
  Administered 2019-08-19: 1 mg via INTRAVENOUS
  Filled 2019-08-18: qty 1
  Filled 2019-08-18 (×2): qty 2
  Filled 2019-08-18: qty 1
  Filled 2019-08-18: qty 2

## 2019-08-18 MED ORDER — LORAZEPAM 2 MG/ML IJ SOLN
0.0000 mg | Freq: Two times a day (BID) | INTRAMUSCULAR | Status: AC
  Filled 2019-08-18: qty 1

## 2019-08-18 MED ORDER — CHLORDIAZEPOXIDE HCL 10 MG PO CAPS
10.0000 mg | ORAL_CAPSULE | Freq: Three times a day (TID) | ORAL | Status: DC
  Administered 2019-08-18 – 2019-08-20 (×9): 10 mg via ORAL
  Filled 2019-08-18 (×9): qty 1

## 2019-08-18 MED ORDER — LORAZEPAM 2 MG/ML IJ SOLN
2.0000 mg | Freq: Once | INTRAMUSCULAR | Status: AC
  Administered 2019-08-18: 2 mg via INTRAVENOUS
  Filled 2019-08-18: qty 1

## 2019-08-18 MED ORDER — LORAZEPAM 1 MG PO TABS
1.0000 mg | ORAL_TABLET | ORAL | Status: AC | PRN
  Administered 2019-08-18 (×2): 4 mg via ORAL
  Administered 2019-08-19: 01:00:00 3 mg via ORAL
  Filled 2019-08-18: qty 4
  Filled 2019-08-18: qty 3
  Filled 2019-08-18: qty 4

## 2019-08-18 MED ORDER — LORAZEPAM 2 MG/ML IJ SOLN
1.0000 mg | INTRAMUSCULAR | Status: AC | PRN
  Administered 2019-08-18: 21:00:00 3 mg via INTRAVENOUS
  Administered 2019-08-21: 03:00:00 1 mg via INTRAVENOUS
  Filled 2019-08-18: qty 2

## 2019-08-18 NOTE — Progress Notes (Signed)
[facility] at [place] NAME: Douglas Peterson    MR#:  [id number]  DATE OF BIRTH:  September 15, 1951  SUBJECTIVE:  Patient was planned on discharge yesterday but unable to go to SNF overnight he was confused and given ativan now he is sleeping soundly.  REVIEW OF SYSTEMS:    Patient received Ativan for withdrawal and is now sleeping  Tolerating Diet: yes      DRUG ALLERGIES:  No Known Allergies  VITALS:  Blood pressure (!) 167/85, pulse 66, temperature (!) 97.5 F (36.4 C), resp. rate 18, height 5' 4.5" (1.638 m), weight 58.1 kg, SpO2 98 %.  PHYSICAL EXAMINATION:  Constitutional: Appears thin and frail no distress. HENT: Normocephalic.  Eyes: no scleral icterus.  Neck: Normal ROM. Neck supple. No JVD. No tracheal deviation. CVS: RRR, S1/S2 +, no murmurs, no gallops, no carotid bruit.  Pulmonary: Effort and breath sounds normal, no stridor, rhonchi, wheezes, rales.  Abdominal: Soft. BS +,  no distension, tenderness, rebound or guarding.  Musculoskeletal: Normal range of motion. No edema and no tenderness.  Neuro:  Sleeping  Dkin: Skin is warm and dry. No rash noted. Psychiatric: Sleeping.      LABORATORY PANEL:   CBC Recent Labs  Lab 08/18/19 0554  WBC 4.1  HGB 13.6  HCT 40.0  PLT 92*   ------------------------------------------------------------------------------------------------------------------  Chemistries  Recent Labs  Lab 08/18/19 0554  NA 137  K 3.5  CL 101  CO2 26  GLUCOSE 96  BUN 6*  CREATININE 0.39*  CALCIUM 8.8*  MG 1.7  AST 66*  ALT 46*  ALKPHOS 98  BILITOT 1.1   ------------------------------------------------------------------------------------------------------------------  Cardiac Enzymes No results for input(s): TROPONINI in the last 168 hours. ------------------------------------------------------------------------------------------------------------------  RADIOLOGY:  No results  found.   ASSESSMENT AND PLAN:   68 year old male with history of EtOH abuse who presented with generalized weakness and found to have low sodium.  1.  Hyponatremia due to beer potominia. Sodium level has improved  2.  EtOH abuse with EtOH withdrawal:  We will add Librium and continue CIWA protocol   3.  Stage II decubitus ulcer on buttock present on admission with local wound care  4.  Elevated LFTs due to chronic EtOH abuse  5.  Essential hypertension: Continue atenolol and Diltiazem      Management plans discussed with nurse  CODE STATUS: FULL  TOTAL TIME TAKING CARE OF THIS PATIENT: 26 minutes.     POSSIBLE D/C 1-2 days SNF, DEPENDING ON CLINICAL CONDITION.   Bettey Costa M.D on 08/18/2019 at 10:18 AM  Between 7am to 6pm - Pager - [phone] After 6pm go to [email or web address] - password [email or web address] [facility]  Office  [phone]  CC: Primary care physician; [name], MD  Note: This dictation was prepared with Dragon dictation along with smaller phrase technology. Any transcriptional errors that result from this process are unintentional.

## 2019-08-18 NOTE — TOC Progression Note (Signed)
Transition of Care (TOC) - Progression Note    Patient Details  Name: Douglas Peterson. MRN: [id number] Date of Birth: 1951-07-20  Transition of Care [facility]) CM/SW Contact  Candie Chroman, LCSW Phone Number: 08/18/2019, 2:40 PM  Clinical Narrative:  No bed offers at this time. CSW expanded search to [facility].   Expected Discharge Plan: [place]    Expected Discharge Plan and Services Expected Discharge Plan: [place]: [place] arrangements for the past 2 months: Single Family Home Expected Discharge Date: 08/17/19                         HH Arranged: PT, OT, Social Work Foster Agency: [facility] Date [place]: 08/17/19   Representative spoke with at [place]: [place] ([place]) Interventions    Readmission Risk Interventions No flowsheet data found.

## 2019-08-19 LAB — BASIC METABOLIC PANEL
Anion gap: 9 (ref 5–15)
BUN: 5 mg/dL — ABNORMAL LOW (ref 8–23)
CO2: 27 mmol/L (ref 22–32)
Calcium: 8.9 mg/dL (ref 8.9–10.3)
Chloride: 103 mmol/L (ref 98–111)
Creatinine, Ser: 0.41 mg/dL — ABNORMAL LOW (ref 0.61–1.24)
GFR calc Af Amer: >60 mL/min (ref >60)
GFR calc non Af Amer: >60 mL/min (ref >60)
Glucose, Bld: 87 mg/dL (ref 70–99)
Potassium: 3.2 mmol/L — ABNORMAL LOW (ref 3.5–5.1)
Sodium: 139 mmol/L (ref 135–145)

## 2019-08-19 MED ORDER — BISACODYL 10 MG RE SUPP: 10.0000 mg | Freq: Every day | RECTAL | Status: DC | PRN

## 2019-08-19 MED ORDER — LACTULOSE 10 GM/15ML PO SOLN
30.0000 g | Freq: Three times a day (TID) | ORAL | Status: DC
  Administered 2019-08-19 – 2019-08-25 (×15): 30 g via ORAL
  Filled 2019-08-19 (×14): qty 60

## 2019-08-19 MED ORDER — VITAMIN B-1 100 MG PO TABS
100.0000 mg | ORAL_TABLET | Freq: Every day | ORAL | Status: DC
  Administered 2019-08-20 – 2019-08-25 (×6): 100 mg via ORAL
  Filled 2019-08-19 (×6): qty 1

## 2019-08-19 NOTE — Progress Notes (Signed)
[facility] at [place] NAME: Douglas Peterson    MR#:  [id number]  DATE OF BIRTH:  30-May-1951  SUBJECTIVE:  Patient received Ativan early this morning for withdrawal Now sleeping again REVIEW OF SYSTEMS:    Patient received Ativan for withdrawal and is now sleeping  Tolerating Diet: yes      DRUG ALLERGIES:  No Known Allergies  VITALS:  Blood pressure (!) 159/88, pulse 65, temperature 98.8 F (37.1 C), temperature source Oral, resp. rate 19, height 5' 4.5" (1.638 m), weight 58.1 kg, SpO2 97 %.  PHYSICAL EXAMINATION:  Constitutional: Appears thin and frail no distress. HENT: Normocephalic.  Eyes: no scleral icterus.  Neck: Normal ROM. Neck supple. No JVD. No tracheal deviation. CVS: RRR, S1/S2 +, no murmurs, no gallops, no carotid bruit.  Pulmonary: Effort and breath sounds normal, no stridor, rhonchi, wheezes, rales.  Abdominal: Soft. BS +,  no distension, tenderness, rebound or guarding.  Musculoskeletal: Normal range of motion. No edema and no tenderness.  Neuro:  Sleeping  Dkin: Skin is warm and dry. No rash noted. Psychiatric: Sleeping.      LABORATORY PANEL:   CBC Recent Labs  Lab 08/18/19 0554  WBC 4.1  HGB 13.6  HCT 40.0  PLT 92*   ------------------------------------------------------------------------------------------------------------------  Chemistries  Recent Labs  Lab 08/18/19 0554  NA 137  K 3.5  CL 101  CO2 26  GLUCOSE 96  BUN 6*  CREATININE 0.39*  CALCIUM 8.8*  MG 1.7  AST 66*  ALT 46*  ALKPHOS 98  BILITOT 1.1   ------------------------------------------------------------------------------------------------------------------  Cardiac Enzymes No results for input(s): TROPONINI in the last 168 hours. ------------------------------------------------------------------------------------------------------------------  RADIOLOGY:  No results found.   ASSESSMENT AND PLAN:   68 year old  male with history of EtOH abuse who presented with generalized weakness and found to have low sodium.  1.  Hyponatremia due to beer potominia. Sodium level has improved  2.  EtOH abuse with EtOH withdrawal:  Continue Librium and continue CIWA protocol Taper Librium as tolerated  3.  Stage II decubitus ulcer on buttock present on admission with local wound care  4.  Elevated LFTs due to chronic EtOH abuse  5.  Essential hypertension: Continue atenolol and Diltiazem      Management plans discussed with nurse  CODE STATUS: FULL  TOTAL TIME TAKING CARE OF THIS PATIENT: 26 minutes.     POSSIBLE D/C 1-2 days SNF, DEPENDING ON CLINICAL CONDITION.   Bettey Costa M.D on 08/19/2019 at 10:33 AM  Between 7am to 6pm - Pager - [phone] After 6pm go to [email or web address] - password [email or web address] [facility]  Office  [phone]  CC: Primary care physician; [name], MD  Note: This dictation was prepared with Dragon dictation along with smaller phrase technology. Any transcriptional errors that result from this process are unintentional.

## 2019-08-19 NOTE — Care Management Important Message (Signed)
Important Message  Patient Details  Name: Douglas Peterson. MRN: [id number] Date of Birth: 01/11/1951   Medicare Important Message Given:  Yes     [name] 08/19/2019, 10:37 AM

## 2019-08-19 NOTE — Progress Notes (Signed)
PT Cancellation Note  Patient Details Name: Douglas Peterson. MRN: [id number] DOB: 1950-11-16   Cancelled Treatment:    Reason Eval/Treat Not Completed: Patient's level of consciousness;Patient declined, no reason specified  Attempted to see pt, he was hardly able to wake, but when he did he repeatedly said "No" with encouragement to work with PT, ambulate, etc.   Kreg Shropshire, DPT 08/19/2019, 5:52 PM

## 2019-08-19 NOTE — Plan of Care (Signed)
  Problem: Education: Goal: Knowledge of General Education information will improve Description: Including pain rating scale, medication(s)/side effects and non-pharmacologic comfort measures Outcome: Progressing   Problem: Education: Goal: Knowledge of disease or condition will improve Outcome: Progressing   Problem: Health Behavior/Discharge Planning: Goal: Ability to identify changes in lifestyle to reduce recurrence of condition will improve Outcome: Not Progressing Goal: Identification of resources available to assist in meeting health care needs will improve Outcome: Progressing   Problem: Physical Regulation: Goal: Complications related to the disease process, condition or treatment will be avoided or minimized Outcome: Not Progressing   Problem: Safety: Goal: Ability to remain free from injury will improve Outcome: Progressing   Problem: Safety: Goal: Ability to remain free from injury will improve Outcome: Progressing

## 2019-08-19 NOTE — Plan of Care (Signed)
  Problem: Education: Goal: Knowledge of General Education information will improve Description: Including pain rating scale, medication(s)/side effects and non-pharmacologic comfort measures Outcome: Progressing   Problem: Education: Goal: Knowledge of disease or condition will improve Outcome: Progressing   Problem: Health Behavior/Discharge Planning: Goal: Ability to identify changes in lifestyle to reduce recurrence of condition will improve Outcome: Progressing Goal: Identification of resources available to assist in meeting health care needs will improve Outcome: Progressing   Problem: Physical Regulation: Goal: Complications related to the disease process, condition or treatment will be avoided or minimized Outcome: Progressing   Problem: Safety: Goal: Ability to remain free from injury will improve Outcome: Progressing

## 2019-08-20 MED ORDER — POTASSIUM CHLORIDE 10 MEQ/100ML IV SOLN
10.0000 meq | INTRAVENOUS | Status: AC
  Administered 2019-08-20: 11:00:00 10 meq via INTRAVENOUS
  Filled 2019-08-20: qty 100

## 2019-08-20 MED ORDER — POTASSIUM CHLORIDE 10 MEQ/100ML IV SOLN
10.0000 meq | INTRAVENOUS | Status: AC
  Administered 2019-08-20 (×3): 10 meq via INTRAVENOUS

## 2019-08-20 NOTE — Progress Notes (Signed)
[place] at [place] NAME: Douglas Peterson    MR#:  [id number]  DATE OF BIRTH:  03-Jun-1951  SUBJECTIVE:  Patient doing much better this morning.  No Ativan overnight. REVIEW OF SYSTEMS:    Review of Systems  Constitutional: Positive for malaise/fatigue. Negative for chills and fever.  HENT: Negative.  Negative for ear discharge, ear pain, hearing loss, nosebleeds and sore throat.   Eyes: Negative.  Negative for blurred vision and pain.  Respiratory: Negative.  Negative for cough, hemoptysis, shortness of breath and wheezing.   Cardiovascular: Negative.  Negative for chest pain, palpitations and leg swelling.  Gastrointestinal: Negative.  Negative for abdominal pain, blood in stool, diarrhea, nausea and vomiting.  Genitourinary: Negative.  Negative for dysuria.  Musculoskeletal: Negative.  Negative for back pain.  Skin: Negative.   Neurological: Negative for dizziness, tremors, speech change, focal weakness, seizures and headaches.  Endo/Heme/Allergies: Negative.  Does not bruise/bleed easily.  Psychiatric/Behavioral: Negative.  Negative for depression, hallucinations and suicidal ideas.   Tolerating Diet: yes      DRUG ALLERGIES:  No Known Allergies  VITALS:  Blood pressure 137/70, pulse 64, temperature 97.6 F (36.4 C), temperature source Oral, resp. rate 19, height 5' 4.5" (1.638 m), weight 58.1 kg, SpO2 97 %.  PHYSICAL EXAMINATION:  Constitutional: Appears thin and frail no distress. HENT: Normocephalic.  Eyes: no scleral icterus.  Neck: Normal ROM. Neck supple. No JVD. No tracheal deviation. CVS: RRR, S1/S2 +, no murmurs, no gallops, no carotid bruit.  Pulmonary: Effort and breath sounds normal, no stridor, rhonchi, wheezes, rales.  Abdominal: Soft. BS +,  no distension, tenderness, rebound or guarding.  Musculoskeletal: Normal range of motion. No edema and no tenderness.  Neuro:  No focal deficit  Skin: Skin is warm and dry.  No rash noted. Psychiatric: no confusion this am   LABORATORY PANEL:   CBC Recent Labs  Lab 08/18/19 0554  WBC 4.1  HGB 13.6  HCT 40.0  PLT 92*   ------------------------------------------------------------------------------------------------------------------  Chemistries  Recent Labs  Lab 08/18/19 0554 08/19/19 0926  NA 137 139  K 3.5 3.2*  CL 101 103  CO2 26 27  GLUCOSE 96 87  BUN 6* 5*  CREATININE 0.39* 0.41*  CALCIUM 8.8* 8.9  MG 1.7  --   AST 66*  --   ALT 46*  --   ALKPHOS 98  --   BILITOT 1.1  --    ------------------------------------------------------------------------------------------------------------------  Cardiac Enzymes No results for input(s): TROPONINI in the last 168 hours. ------------------------------------------------------------------------------------------------------------------  RADIOLOGY:  No results found.   ASSESSMENT AND PLAN:   68 year old male with history of EtOH abuse who presented with generalized weakness and found to have low sodium.  1.  Hyponatremia due to beer potominia. Sodium level has improved  2.  EtOH abuse with EtOH withdrawal:  Patient has improved.   3.  Stage II decubitus ulcer on buttock present on admission with local wound care  4.  Elevated LFTs due to chronic EtOH abuse  5.  Essential hypertension: Continue atenolol and Diltiazem      Management plans discussed with nurse and patient  CODE STATUS: FULL  TOTAL TIME TAKING CARE OF THIS PATIENT: 22 minutes.     POSSIBLE D/C today SNF, DEPENDING ON CLINICAL CONDITION.   Bettey Costa M.D on 08/20/2019 at 9:44 AM  Between 7am to 6pm - Pager - [phone] After 6pm go to [email or web address] - password [email or web address]  [employer]  Office  [phone]  CC: Primary care physician; [name], MD  Note: This dictation was prepared with Dragon dictation along with smaller phrase technology. Any transcriptional errors that  result from this process are unintentional.

## 2019-08-20 NOTE — TOC Progression Note (Addendum)
Transition of Care (TOC) - Progression Note    Patient Details  Name: Douglas Peterson. MRN: [id number] Date of Birth: July 14, 1951  Transition of Care [facility]) CM/SW Contact  Latanya Maudlin, RN Phone Number: 08/20/2019, 11:47 AM  Clinical Narrative:  Patient has only one standing bed offer from [facility] Spoke to patient and he has had fluctuating orientation related to withdrawals but tells me he understands he cannot go home at this time since he lives alone and is unable to walk right now. Patient tells me he has no preference of rehab facility as long "as he doesn't have to pay for it with cash". Per patient preference accepted bed in [facility] at [facility]. Voicemail left for Neoma Laming at [facility] to notify her of bed acceptance and to start Authorization.   11:57- Neoma Laming from [facility] to start [place] today.     Expected Discharge Plan: [place]    Expected Discharge Plan and Services Expected Discharge Plan: [place] Choice: [place] arrangements for the past 2 months: Single Family Home Expected Discharge Date: 08/20/19                         HH Arranged: PT, OT, Social Work [place] Agency: [facility] Date [place]: 08/17/19   Representative spoke with at [place]: [place]) Interventions    Readmission Risk Interventions No flowsheet data found.

## 2019-08-21 MED ORDER — QUETIAPINE FUMARATE 25 MG PO TABS
25.0000 mg | ORAL_TABLET | Freq: Once | ORAL | Status: AC
  Administered 2019-08-21: 25 mg via ORAL
  Filled 2019-08-21: qty 1

## 2019-08-21 MED ORDER — QUETIAPINE FUMARATE 25 MG PO TABS: 12.5000 mg | ORAL_TABLET | Freq: Once | ORAL | Status: DC

## 2019-08-21 NOTE — Plan of Care (Signed)
  Problem: Education: Goal: Knowledge of General Education information will improve Description: Including pain rating scale, medication(s)/side effects and non-pharmacologic comfort measures Outcome: Progressing   Problem: Education: Goal: Knowledge of disease or condition will improve Outcome: Progressing   Problem: Health Behavior/Discharge Planning: Goal: Ability to identify changes in lifestyle to reduce recurrence of condition will improve Outcome: Progressing Goal: Identification of resources available to assist in meeting health care needs will improve Outcome: Progressing   Problem: Physical Regulation: Goal: Complications related to the disease process, condition or treatment will be avoided or minimized Outcome: Progressing   Problem: Safety: Goal: Ability to remain free from injury will improve Outcome: Progressing

## 2019-08-21 NOTE — Progress Notes (Signed)
[place] at [place] NAME: Douglas Peterson    MR#:  [id number]  DATE OF BIRTH:  August 19, 1951  SUBJECTIVE:  Patient eating breakfast this am   REVIEW OF SYSTEMS:    Review of Systems  Constitutional: Positive for malaise/fatigue. Negative for chills and fever.  HENT: Negative.  Negative for ear discharge, ear pain, hearing loss, nosebleeds and sore throat.   Eyes: Negative.  Negative for blurred vision and pain.  Respiratory: Negative.  Negative for cough, hemoptysis, shortness of breath and wheezing.   Cardiovascular: Negative.  Negative for chest pain, palpitations and leg swelling.  Gastrointestinal: Negative.  Negative for abdominal pain, blood in stool, diarrhea, nausea and vomiting.  Genitourinary: Negative.  Negative for dysuria.  Musculoskeletal: Negative.  Negative for back pain.  Skin: Negative.   Neurological: Negative for dizziness, tremors, speech change, focal weakness, seizures and headaches.  Endo/Heme/Allergies: Negative.  Does not bruise/bleed easily.  Psychiatric/Behavioral: Negative.  Negative for depression, hallucinations and suicidal ideas.   Tolerating Diet: yes      DRUG ALLERGIES:  No Known Allergies  VITALS:  Blood pressure 125/89, pulse 65, temperature 97.7 F (36.5 C), temperature source Oral, resp. rate 16, height 5' 4.5" (1.638 m), weight 58.1 kg, SpO2 98 %.  PHYSICAL EXAMINATION:  Constitutional: Appears thin and frail no distress. HENT: Normocephalic.  Eyes: no scleral icterus.  Neck: Normal ROM. Neck supple. No JVD. No tracheal deviation. CVS: RRR, S1/S2 +, no murmurs, no gallops, no carotid bruit.  Pulmonary: Effort and breath sounds normal, no stridor, rhonchi, wheezes, rales.  Abdominal: Soft. BS +,  no distension, tenderness, rebound or guarding.  Musculoskeletal: Normal range of motion. No edema and no tenderness.  Neuro:  No focal deficit  Skin: Skin is warm and dry. No rash  noted. Psychiatric: norma mental status this am   LABORATORY PANEL:   CBC Recent Labs  Lab 08/18/19 0554  WBC 4.1  HGB 13.6  HCT 40.0  PLT 92*   ------------------------------------------------------------------------------------------------------------------  Chemistries  Recent Labs  Lab 08/18/19 0554 08/19/19 0926  NA 137 139  K 3.5 3.2*  CL 101 103  CO2 26 27  GLUCOSE 96 87  BUN 6* 5*  CREATININE 0.39* 0.41*  CALCIUM 8.8* 8.9  MG 1.7  --   AST 66*  --   ALT 46*  --   ALKPHOS 98  --   BILITOT 1.1  --    ------------------------------------------------------------------------------------------------------------------  Cardiac Enzymes No results for input(s): TROPONINI in the last 168 hours. ------------------------------------------------------------------------------------------------------------------  RADIOLOGY:  No results found.   ASSESSMENT AND PLAN:   68 year old male with history of EtOH abuse who presented with generalized weakness and found to have low sodium.  1.  Hyponatremia due to beer potominia. Sodium level has improved  2.  EtOH abuse with EtOH withdrawal:  Patient has improved.   3.  Stage II decubitus ulcer on buttock present on admission with local wound care  4.  Elevated LFTs due to chronic EtOH abuse  5.  Essential hypertension: Continue atenolol and Diltiazem  6. Hypokalemia: replete PRN  Will go to SNF once we have [employer] approval  Management plans discussed with nurse and patient  CODE STATUS: FULL  TOTAL TIME TAKING CARE OF THIS PATIENT: 22 minutes.      Bettey Costa M.D on 08/21/2019 at 9:01 AM  Between 7am to 6pm - Pager - [phone] After 6pm go to [email or web address] - password [email or web address]  [employer]  Office  [phone]  CC: Primary care physician; [name], MD  Note: This dictation was prepared with Dragon dictation along with smaller phrase technology. Any transcriptional  errors that result from this process are unintentional.

## 2019-08-22 LAB — BASIC METABOLIC PANEL
Anion gap: 7 (ref 5–15)
BUN: 6 mg/dL — ABNORMAL LOW (ref 8–23)
CO2: 26 mmol/L (ref 22–32)
Calcium: 8.8 mg/dL — ABNORMAL LOW (ref 8.9–10.3)
Chloride: 105 mmol/L (ref 98–111)
Creatinine, Ser: 0.45 mg/dL — ABNORMAL LOW (ref 0.61–1.24)
GFR calc Af Amer: >60 mL/min (ref >60)
GFR calc non Af Amer: >60 mL/min (ref >60)
Glucose, Bld: 95 mg/dL (ref 70–99)
Potassium: 3.2 mmol/L — ABNORMAL LOW (ref 3.5–5.1)
Sodium: 138 mmol/L (ref 135–145)

## 2019-08-22 LAB — SARS CORONAVIRUS 2 (TAT 6-24 HRS): SARS Coronavirus 2: NEGATIVE

## 2019-08-22 MED ORDER — POTASSIUM CHLORIDE CRYS ER 20 MEQ PO TBCR
40.0000 meq | EXTENDED_RELEASE_TABLET | Freq: Once | ORAL | Status: AC
  Administered 2019-08-22: 40 meq via ORAL
  Filled 2019-08-22: qty 2

## 2019-08-22 NOTE — Care Management Important Message (Signed)
Important Message  Patient Details  Name: Douglas Peterson. MRN: [id number] Date of Birth: 1950-11-27   Medicare Important Message Given:  Yes     Juliann Pulse A  08/22/2019, 11:08 AM

## 2019-08-22 NOTE — Progress Notes (Addendum)
Douglas Peterson at [place] NAME: Douglas Peterson    MR#:  [id number]  DATE OF BIRTH:  09/19/1951  SUBJECTIVE:   He is feeling fine today.  He denies any concerns.  No chest pain, shortness of breath, or abdominal pain.  He states he is ready to be discharged from the hospital.  REVIEW OF SYSTEMS:    Review of Systems  Constitutional: Positive for malaise/fatigue. Negative for chills and fever.  HENT: Negative.  Negative for ear discharge, ear pain, hearing loss, nosebleeds and sore throat.   Eyes: Negative.  Negative for blurred vision and pain.  Respiratory: Negative.  Negative for cough, hemoptysis, shortness of breath and wheezing.   Cardiovascular: Negative.  Negative for chest pain, palpitations and leg swelling.  Gastrointestinal: Negative.  Negative for abdominal pain, blood in stool, diarrhea, nausea and vomiting.  Genitourinary: Negative.  Negative for dysuria.  Musculoskeletal: Negative.  Negative for back pain.  Skin: Negative.   Neurological: Negative for dizziness, tremors, speech change, focal weakness, seizures and headaches.  Endo/Heme/Allergies: Negative.  Does not bruise/bleed easily.  Psychiatric/Behavioral: Negative.  Negative for depression, hallucinations and suicidal ideas.   DRUG ALLERGIES:  No Known Allergies  VITALS:  Blood pressure 104/64, pulse 61, temperature 97.7 F (36.5 C), temperature source Axillary, resp. rate 16, height 5' 4.5" (1.638 m), weight 58.1 kg, SpO2 98 %.  PHYSICAL EXAMINATION:  Constitutional: Appears thin and frail no distress. HENT: Normocephalic.  Eyes: no scleral icterus.  Neck: Normal ROM. Neck supple. No JVD. No tracheal deviation. CVS: RRR, S1/S2 +, no murmurs, no gallops, no carotid bruit.  Pulmonary: Effort and breath sounds normal, no stridor, rhonchi, wheezes, rales.  Abdominal: Soft. BS +,  no distension, tenderness, rebound or guarding.  Musculoskeletal: Normal range of motion. No  edema and no tenderness.  Neuro:  No focal deficit  Skin: Skin is warm and dry. No rash noted. Psychiatric: Awake, answering questions appropriately.  LABORATORY PANEL:   CBC Recent Labs  Lab 08/18/19 0554  WBC 4.1  HGB 13.6  HCT 40.0  PLT 92*   ------------------------------------------------------------------------------------------------------------------  Chemistries  Recent Labs  Lab 08/18/19 0554  08/22/19 0407  NA 137   < > 138  K 3.5   < > 3.2*  CL 101   < > 105  CO2 26   < > 26  GLUCOSE 96   < > 95  BUN 6*   < > 6*  CREATININE 0.39*   < > 0.45*  CALCIUM 8.8*   < > 8.8*  MG 1.7  --   --   AST 66*  --   --   ALT 46*  --   --   ALKPHOS 98  --   --   BILITOT 1.1  --   --    < > = values in this interval not displayed.   ------------------------------------------------------------------------------------------------------------------  Cardiac Enzymes No results for input(s): TROPONINI in the last 168 hours. ------------------------------------------------------------------------------------------------------------------  RADIOLOGY:  No results found.   ASSESSMENT AND PLAN:   Hyponatremia due to beer potomania- resolved with IVFs.  Hypokalemia- replete and recheck.  EtOH abuse with EtOH withdrawal- improved. Received librium and ativan this admission.  Stage II decubitus ulcer on buttock- present on admission. Continue local wound care.  Elevated LFTs- due to chronic EtOH abuse. Monitor as an outpatient.  Essential hypertension- continue atenolol and diltiazem  Hopeful for discharge to SNF tomorrow. Awaiting insurance authorization. Repeat COVID test performed today. Attempted  to call patient's daughter, Douglas Peterson, over the phone, but I had to leave a voicemail.  Management plans discussed with nurse and patient  CODE STATUS: FULL  TOTAL TIME TAKING CARE OF THIS PATIENT: 40 minutes.    Berna Spare Mayo M.D on 08/22/2019 at 1:27 PM  Between 7am to  6pm - Pager - [phone]  After 6pm go to [email or web address] - password [email or web address] [facility]  Office  [phone]  CC: Primary care physician; Douglas Kayser, MD  Note: This dictation was prepared with Dragon dictation along with smaller phrase technology. Any transcriptional errors that result from this process are unintentional.

## 2019-08-22 NOTE — TOC Progression Note (Addendum)
Transition of Care (TOC) - Progression Note    Patient Details  Name: Kendarious Gudino. MRN: [id number] Date of Birth: 19-Dec-1950  Transition of Care [facility]) CM/SW Contact  , Lenice Llamas Phone Number: [phone]  08/22/2019, 9:08 AM  Clinical Narrative: Clinical Social Worker ([place]) left Armed forces operational officer at [facility] a voicemail this morning to check on status of [facility] SNF authorization.   9:25 am: CSW received a call back from Neoma Laming stating that [facility] authorization is still pending. CSW made Neoma Laming aware that patient's most recent covid test was on 10/11. Per Neoma Laming patient will need another covid test before coming to [facility]. MD aware of above and will order covid test.     Expected Discharge Plan: [place]    Expected Discharge Plan and Services Expected Discharge Plan: [place] Choice: [place] arrangements for the past 2 months: Single Family Home Expected Discharge Date: 08/20/19                         HH Arranged: PT, OT, Social Work [employer] Agency: [facility] Date [place]: 08/17/19   Representative spoke with at [place]: [place] ([place]) Interventions    Readmission Risk Interventions No flowsheet data found.

## 2019-08-22 NOTE — Progress Notes (Signed)
Occupational Therapy Treatment Patient Details Name: Douglas Peterson. MRN: [id number] DOB: November 23, 1950 Today's Date: 08/22/2019    History of present illness presented to ER secondary to weakness, diarrhea; admitted for management of acute hyponatremia.   OT comments  Douglas Peterson was seen for OT treatment on this date. Upon arrival to room pt awake/alert, seated upright in bed with breakfast tray. Pt agreeable to OT tx and stated he is having difficulty with his breakfast of eggs and grits as he is unable to use his RUE at all for self-feeding this date. Pt declines to continue eating with assistance, but agreeable to sit EOB for gown change as he had spilled a variety of food items onto himself while attempting to eat breakfast. Pt educated in compensatory dressing strategies such as dressing his RUE first to support increased independence with UB dressing this date. Pt noted to have increased flexion/tone t/o his RUE, but particularly at his elbow, wrist, and hand this date. Pt is unable to actively extend his RUE consistently, but is able to attain increased extension with gentle passive stretch. Despite prolonged stretching by this therapist, extension is not maintained and RUE continues to be limited functionally t/o remainder of session.  OT educated pt on using his LUE to engage his RUE with passive stretching. Pt verbalizes understanding of education provided, but would benefit from reinforcement. RN and MD notified of pt increased RUE flexion and decreased functional use this date. This therapist suggests pt be assessed for a resting hand splint to minimize further functional limitations and promote neutral positioning of his R wrist and hand. MD aware. Pt making progress toward goals and continues to benefit from skilled OT services to maximize return to PLOF and minimize risk of future falls, injury, caregiver burden, and readmission. Will continue to follow POC. Discharge recommendation remains  appropriate.    Follow Up Recommendations  SNF    Equipment Recommendations  Other (comment)(Defer to next level of care)    Recommendations for Other Services      Precautions / Restrictions Precautions Precautions: Fall Precaution Comments: Balance poor/zero when tremors increase Restrictions Weight Bearing Restrictions: No       Mobility Bed Mobility Overal bed mobility: Needs Assistance Bed Mobility: Supine to Sit;Sit to Supine     Supine to sit: Min assist Sit to supine: Mod assist   General bed mobility comments: Increased flexion in RUE limit bed mobility. Tremors are decreased but present t/o this session as well.  Transfers Overall transfer level: Needs assistance Equipment used: Rolling walker (2 wheeled) Transfers: Sit to/from Stand Sit to Stand: Mod assist;Max assist         General transfer comment: STS attempted x3 this date. Max to maintain stand with heavy posterior lean. Max assist to advance BLEs to left for side stepping at EOB. Pt unable to bring feet off the floor this date.    Balance Overall balance assessment: Needs assistance Sitting-balance support: Single extremity supported;Feet supported Sitting balance-Leahy Scale: Fair Sitting balance - Comments: Pt sits EOB to don clean hospital gown. Fair static sitting with minimal weight shift during functional activity. When pt able to rest feet on floor BLE tremors decrease.   Standing balance support: Bilateral upper extremity supported Standing balance-Leahy Scale: Zero                             ADL either performed or assessed with clinical judgement   ADL Overall ADL's :  Needs assistance/impaired Eating/Feeding: Sitting;Minimal assistance;Set up Eating/Feeding Details (indicate cue type and reason): Pt noted to have increased RUE flexion patterns this date with his elbow, wrist, MCPs, and IPs flexed and difficult to range. Pt states he is having increased difficulty feeding  himself. He was observed to have spilled a good amount of the food from his breakfast tray onto himself and his bedding this date. Recommend pt has at least min assist for set-up of meal tray items, and is monitored to ensure he is able to feed himself a variety of foods. Grooming: Set up;Supervision/safety;Minimal assistance           Upper Body Dressing : Set up;Moderate assistance;Minimal assistance;Supervision/safety Upper Body Dressing Details (indicate cue type and reason): Pt donns clean hospital gown given min/mod assist (moderate assist over RUE) while seated EOB this date.                   General ADL Comments: Pt attempts functional mobility this date but defered 2/2 significant posterior lean in static standing. Requires max support to maintain upright position while standing EOB. Max A for weight shift and to advance feet to side-step at EOB to reposition for sit>sup tf.     Vision Baseline Vision/History: No visual deficits Patient Visual Report: No change from baseline     Perception     Praxis      Cognition Arousal/Alertness: Awake/alert Behavior During Therapy: WFL for tasks assessed/performed Overall Cognitive Status: Difficult to assess                                 General Comments: Decreased awareness this session: states he is in hospital, but states hospital is [facility], states he drank too much over the weekend and that is why he is having trouble moving this date. Time/date awareness; decreased situational awareness. Decreased safety awareness        Exercises General Exercises - Upper Extremity Elbow Extension: PROM;Right;Other reps (comment);Seated(Gentle passive stretch.) Wrist Flexion: Right;PROM;Seated(Gentle passive stretch.) Wrist Extension: Other (comment);PROM;Seated(Gentle passive stretch.) Composite Extension: PROM;Seated;Right(Gentle passive stretch.) Other Exercises Other Exercises: Pt educated on safe positioning and  ROM activities for RUE to maximize functional use and skin integrity this date Other Exercises: Pt assisted with UB dressing task   Shoulder Instructions       General Comments Pt noted to have decr. functional ROM in RUE this date with R elbow, wrist, and digits maintaining a generally flexed position. Pt able to attain increased extension t/o RUE with gentle passive stretching, but does not maintain for functional use. MD notified and resting hand splint ordered for pt. RN also notified and aware.    Pertinent Vitals/ Pain       Pain Assessment: No/denies pain  Home Living                                          Prior Functioning/Environment              Frequency  Min 2X/week        Progress Toward Goals  OT Goals(current goals can now be found in the care plan section)  Progress towards OT goals: Progressing toward goals  Acute Rehab OT Goals Patient Stated Goal: To regain as much independence as possible OT Goal Formulation: With patient Time For Goal Achievement:  08/29/19 Potential to Achieve Goals: Good  Plan Discharge plan remains appropriate;Frequency remains appropriate    Co-evaluation                 AM-PAC OT "6 Clicks" Daily Activity     Outcome Measure   Help from another person eating meals?: A Little Help from another person taking care of personal grooming?: A Little Help from another person toileting, which includes using toliet, bedpan, or urinal?: A Lot Help from another person bathing (including washing, rinsing, drying)?: A Lot Help from another person to put on and taking off regular upper body clothing?: A Lot Help from another person to put on and taking off regular lower body clothing?: A Lot 6 Click Score: 14    End of Session Equipment Utilized During Treatment: Gait belt;Rolling walker  OT Visit Diagnosis: Other abnormalities of gait and mobility (R26.89);History of falling (Z91.81)   Activity Tolerance  Patient tolerated treatment well   Patient Left with call bell/phone within reach;in bed;with bed alarm set   Nurse Communication Other (comment)(Pt would benefit from resting hand splint. MD notified as well.)        Time: 6168-3729 OT Time Calculation (min): 30 min  Charges: OT General Charges $OT Visit: 1 Visit OT Treatments $Self Care/Home Management : 8-22 mins $Therapeutic Exercise: 8-22 mins  Shara Blazing, M.S., OTR/L Ascom: [phone] 08/22/19, 11:04 AM

## 2019-08-22 NOTE — Plan of Care (Signed)
  Problem: Education: Goal: Knowledge of General Education information will improve Description: Including pain rating scale, medication(s)/side effects and non-pharmacologic comfort measures Outcome: Progressing   Problem: Education: Goal: Knowledge of disease or condition will improve Outcome: Progressing   Problem: Health Behavior/Discharge Planning: Goal: Ability to identify changes in lifestyle to reduce recurrence of condition will improve Outcome: Progressing Goal: Identification of resources available to assist in meeting health care needs will improve Outcome: Progressing   Problem: Physical Regulation: Goal: Complications related to the disease process, condition or treatment will be avoided or minimized Outcome: Progressing   Problem: Safety: Goal: Ability to remain free from injury will improve Outcome: Progressing

## 2019-08-23 LAB — BASIC METABOLIC PANEL
Anion gap: 7 (ref 5–15)
BUN: 12 mg/dL (ref 8–23)
CO2: 25 mmol/L (ref 22–32)
Calcium: 8.9 mg/dL (ref 8.9–10.3)
Chloride: 103 mmol/L (ref 98–111)
Creatinine, Ser: 0.51 mg/dL — ABNORMAL LOW (ref 0.61–1.24)
GFR calc Af Amer: >60 mL/min (ref >60)
GFR calc non Af Amer: >60 mL/min (ref >60)
Glucose, Bld: 87 mg/dL (ref 70–99)
Potassium: 4.2 mmol/L (ref 3.5–5.1)
Sodium: 135 mmol/L (ref 135–145)

## 2019-08-23 NOTE — Progress Notes (Signed)
Douglas Peterson at [place] NAME: Douglas Peterson    MR#:  [id number]  DATE OF BIRTH:  02/24/1951  SUBJECTIVE:   Patient has no concerns this morning.  He is asking when he can leave the hospital.  He feels overall weak, but is otherwise doing fine.  REVIEW OF SYSTEMS:    Review of Systems  Constitutional: Positive for malaise/fatigue. Negative for chills and fever.  HENT: Negative.  Negative for ear discharge, ear pain, hearing loss, nosebleeds and sore throat.   Eyes: Negative.  Negative for blurred vision and pain.  Respiratory: Negative.  Negative for cough, hemoptysis, shortness of breath and wheezing.   Cardiovascular: Negative.  Negative for chest pain, palpitations and leg swelling.  Gastrointestinal: Negative.  Negative for abdominal pain, blood in stool, diarrhea, nausea and vomiting.  Genitourinary: Negative.  Negative for dysuria.  Musculoskeletal: Negative.  Negative for back pain.  Skin: Negative.   Neurological: Negative for dizziness, tremors, speech change, focal weakness, seizures and headaches.  Endo/Heme/Allergies: Negative.  Does not bruise/bleed easily.  Psychiatric/Behavioral: Negative.  Negative for depression, hallucinations and suicidal ideas.   DRUG ALLERGIES:  No Known Allergies  VITALS:  Blood pressure 108/60, pulse (!) 55, temperature 97.8 F (36.6 C), temperature source Oral, resp. rate 16, height 5' 4.5" (1.638 m), weight 58.1 kg, SpO2 96 %.  PHYSICAL EXAMINATION:  Constitutional: Appears thin and frail no distress. HENT: Normocephalic.  Eyes: no scleral icterus.  Neck: Normal ROM. Neck supple. No JVD. No tracheal deviation. CVS: RRR, S1/S2 +, no murmurs, no gallops, no carotid bruit.  Pulmonary: Effort and breath sounds normal, no stridor, rhonchi, wheezes, rales.  Abdominal: Soft. BS +,  no distension, tenderness, rebound or guarding.  Musculoskeletal: Normal range of motion. No edema and no tenderness.   Neuro:  No focal deficit  Skin: Skin is warm and dry. No rash noted. Psychiatric: Awake, answering questions appropriately.  LABORATORY PANEL:   CBC Recent Labs  Lab 08/18/19 0554  WBC 4.1  HGB 13.6  HCT 40.0  PLT 92*   ------------------------------------------------------------------------------------------------------------------  Chemistries  Recent Labs  Lab 08/18/19 0554  08/23/19 0337  NA 137   < > 135  K 3.5   < > 4.2  CL 101   < > 103  CO2 26   < > 25  GLUCOSE 96   < > 87  BUN 6*   < > 12  CREATININE 0.39*   < > 0.51*  CALCIUM 8.8*   < > 8.9  MG 1.7  --   --   AST 66*  --   --   ALT 46*  --   --   ALKPHOS 98  --   --   BILITOT 1.1  --   --    < > = values in this interval not displayed.   ------------------------------------------------------------------------------------------------------------------  Cardiac Enzymes No results for input(s): TROPONINI in the last 168 hours. ------------------------------------------------------------------------------------------------------------------  RADIOLOGY:  No results found.   ASSESSMENT AND PLAN:   Hyponatremia due to beer potomania- resolved with IVFs.  Hypokalemia- resolved with repletion  EtOH abuse with EtOH withdrawal- improved. Received librium and ativan this admission.  Stage II decubitus ulcer on buttock- present on admission. Continue local wound care.  Elevated LFTs- due to chronic EtOH abuse. Monitor as an outpatient.  Essential hypertension- continue atenolol and diltiazem  Hopeful for discharge to SNF tomorrow. Awaiting insurance authorization. Repeat COVID test performed 10/19 was negative. Daughter, Douglas Peterson, updated via  the phone.  Management plans discussed with nurse and patient  CODE STATUS: FULL  TOTAL TIME TAKING CARE OF THIS PATIENT: 36 minutes.    Douglas Peterson  M.D on 08/23/2019 at 2:54 PM  Between 7am to 6pm - Pager [phone]  After 6pm go to [email or web address] -  password [email or web address] [facility]  Office  [phone]  CC: Primary care physician; Douglas Kayser, MD  Note: This dictation was prepared with Dragon dictation along with smaller phrase technology. Any transcriptional errors that result from this process are unintentional.

## 2019-08-23 NOTE — Progress Notes (Signed)
Occupational Therapy Treatment Patient Details Name: Douglas Peterson. MRN: [id number] DOB: 1951-04-15 Today's Date: 08/23/2019    History of present illness presented to ER secondary to weakness, diarrhea; admitted for management of acute hyponatremia.   OT comments  Pt seen for OT session and was sitting upright in bed eating breakfast.   Pt having difficulty with self feeding pancakes and eggs and given red foam utensil holders which improved grasp and control of food on spoon.  NSG updated.  Pt also seen to assess for splint need for RUE and talked to Ashely from [facility] who was aware of one being ordered from [place] but updated her that they do not stock resting hand splints, only wrist cock up splints which would not help him with increasing wrist and finger extension. Rec calling [employer] who is company who supplies items like this and Caryl Pina said she would call and find out about getting one for his R hand.  Placed a rolled up wash cloth secured with tape in his R hand which helped to keep web space open and fingers in minimal extension and not flexed tightly into palm of hand.  Pt able to demonstrate self ROM to R hand.  Continue to rec SNF after discharge for continued rehab.  Will follow up with NSG tomorrow about obtaining a resting hand splint.  Follow Up Recommendations  SNF    Equipment Recommendations  Other (comment)    Recommendations for Other Services      Precautions / Restrictions Precautions Precautions: Fall Restrictions Weight Bearing Restrictions: No       Mobility Bed Mobility                  Transfers                      Balance                                           ADL either performed or assessed with clinical judgement   ADL Overall ADL's : Needs assistance/impaired Eating/Feeding: Sitting;Minimal assistance;Set up Eating/Feeding Details (indicate cue type and reason): Pt having difficulty with self  feeding pancakes and eggs and given red foam utensil holders which improved grasp and control of food on spoon.  NSG updated.                                   General ADL Comments: Pt seen to assess for splint need for RUE and talked to Ashely from [facility] who was aware of one being ordered from [place] but updated her that they do not stock resting hand splints, only wrist cock up splints which would not help him with increasing wrist and finger extension. Rec calling [employer] who is company who supplies items like this and Caryl Pina said she would call and find out about getting one for his R hand.  Placed a rolled up wash cloth secured with tape in his R hand which helped to keep web space open and fingers in minimal extension and not flexed tightly into palm of hand.  Pt able to demonstrate self ROM to R hand.     Vision Patient Visual Report: No change from baseline     Perception     Praxis  Cognition                                                Exercises     Shoulder Instructions       General Comments      Pertinent Vitals/ Pain       Pain Assessment: No/denies pain  Home Living                                          Prior Functioning/Environment              Frequency  Min 2X/week        Progress Toward Goals  OT Goals(current goals can now be found in the care plan section)  Progress towards OT goals: Progressing toward goals  Acute Rehab OT Goals Patient Stated Goal: To regain as much independence as possible OT Goal Formulation: With patient Time For Goal Achievement: 08/29/19 Potential to Achieve Goals: Good  Plan Discharge plan remains appropriate;Frequency remains appropriate    Co-evaluation                 AM-PAC OT "6 Clicks" Daily Activity     Outcome Measure   Help from another person eating meals?: A Little Help from another person taking care of personal grooming?:  A Little Help from another person toileting, which includes using toliet, bedpan, or urinal?: A Lot Help from another person bathing (including washing, rinsing, drying)?: A Lot Help from another person to put on and taking off regular upper body clothing?: A Lot Help from another person to put on and taking off regular lower body clothing?: A Lot 6 Click Score: 14    End of Session    OT Visit Diagnosis: Other abnormalities of gait and mobility (R26.89);History of falling (Z91.81)   Activity Tolerance Patient tolerated treatment well   Patient Left with call bell/phone within reach;in bed;with bed alarm set   Nurse Communication Other (comment)(Ashley from [facility] to call [employer] about resting hand splint for R hand)        Time: 1030-1055 OT Time Calculation (min): 25 min  Charges: OT General Charges $OT Visit: 1 Visit OT Treatments $Self Care/Home Management : 8-22 mins $Therapeutic Activity: 8-22 mins  Chrys Racer, OTR/L, [place] ascom [phone] 08/23/19, 1:21 PM

## 2019-08-24 MED ORDER — ENSURE ENLIVE PO LIQD
237.0000 mL | Freq: Two times a day (BID) | ORAL | Status: DC
  Administered 2019-08-25 (×2): 237 mL via ORAL

## 2019-08-24 NOTE — Plan of Care (Signed)
  Problem: Education: Goal: Knowledge of General Education information will improve Description: Including pain rating scale, medication(s)/side effects and non-pharmacologic comfort measures Outcome: Progressing   Problem: Education: Goal: Knowledge of disease or condition will improve Outcome: Progressing   Problem: Health Behavior/Discharge Planning: Goal: Ability to identify changes in lifestyle to reduce recurrence of condition will improve Outcome: Progressing Goal: Identification of resources available to assist in meeting health care needs will improve Outcome: Progressing   Problem: Physical Regulation: Goal: Complications related to the disease process, condition or treatment will be avoided or minimized Outcome: Progressing   Problem: Safety: Goal: Ability to remain free from injury will improve Outcome: Progressing

## 2019-08-24 NOTE — Progress Notes (Signed)
Physical Therapy Treatment Patient Details Name: Douglas Peterson. MRN: [id number] DOB: 01-Aug-1951 Today's Date: 08/24/2019    History of Present Illness presented to ER secondary to weakness, diarrhea; admitted for management of acute hyponatremia.    PT Comments    Pt agreeable to PT; denies pain. Pt currently wearing R hand splint. Splint removed for ambulation/use of rolling walker and notes he is over the wear time currently. Mobility improved throughout to Min A for bed mobility, STS from various surfaces, during functional stand activities and for gait. Pt continues to demonstrate mild LOB episodes with ambulation due to low clearance of B feet, R > L and lack of safety awareness moving feet outside of rolling walker parameters with gait turning, chair approach and during stand functional activities. Self correction steppage efforts, but also requires Min A for correction. Overall, pt improving with mobility; however, continues to demonstrate some poor safety awareness and balance/gait deficits increasing pt's risk of falls. Continue PT to progress strength, endurance, balance and safety awareness to improve functional mobility.    Follow Up Recommendations  SNF     Equipment Recommendations       Recommendations for Other Services       Precautions / Restrictions Precautions Precautions: Fall Precaution Comments: Balance poor/zero when tremors increase Restrictions Weight Bearing Restrictions: No    Mobility  Bed Mobility Overal bed mobility: Needs Assistance Bed Mobility: Sit to Supine     Supine to sit: Supervision Sit to supine: Min assist   General bed mobility comments: Assist for control trunk decline and repositioning upward in bed  Transfers Overall transfer level: Needs assistance Equipment used: Rolling walker (2 wheeled) Transfers: Sit to/from Stand Sit to Stand: Min assist         General transfer comment: STS from recliner, to from commode and to  bed. Slightly greater Min A to/from commode   Ambulation/Gait Ambulation/Gait assistance: Min assist Gait Distance (Feet): 125 Feet Assistive device: Rolling walker (2 wheeled) Gait Pattern/deviations: Step-through pattern;Decreased stride length;Shuffle   Gait velocity interpretation: <1.8 ft/sec, indicate of risk for recurrent falls General Gait Details: Overall slow pace. Decreased clearance B feet; however, often R foot drag/toe catch that pt aware of, but difficulty avoiding. Pt requires safety cues with turns and chair/commode approach to stay within parameters of the rw. Several mild LOB with steppage and Min A to correct. Pt also has tendency to have posterior lean initially with ambulation, which corrects with continued gait.    Stairs             Wheelchair Mobility    Modified Rankin (Stroke Patients Only)       Balance Overall balance assessment: Needs assistance Sitting-balance support: Bilateral upper extremity supported;Feet supported;No upper extremity supported Sitting balance-Leahy Scale: Fair Sitting balance - Comments: Pt sits EOB to don shoes. Fair static sitting with minimal weight shift during functional activity. When pt able to rest feet on floor BLE tremors decrease.   Standing balance support: Bilateral upper extremity supported;During functional activity Standing balance-Leahy Scale: Poor Standing balance comment: Pt demonstrates need to have support in stand at all times. (leans on sink with forearms for balance with hand hygiene) Requires wide base of support for and forearm support during reach activities. Min guard to [employer] A from therapist throughout                             Cognition Arousal/Alertness: Awake/alert Behavior During Therapy:  WFL for tasks assessed/performed Overall Cognitive Status: Within Functional Limits for tasks assessed                                 General Comments: Pt demonstrates improved  overall cognitive status since past sessions. He is A&O x4 this date, and appears more aware of his functional deficits. He demonstrates improved overall safety awareness and follows 1 step VCs consistently t/o session.      Exercises Other Exercises Other Exercises: set up and supervision with toileting. Min guard to Min A during stand hand hygiene Other Exercises: Pt stands and ambulates from EOB to recliner given one person hand held assist this date. Other Exercises: Pt educated on mgt of his resting hand splint including wear schedule, considerations for skin integrity, and hand/elbow ROM exercises.    General Comments General comments (skin integrity, edema, etc.): Pt left with resting hand splint in place. Pt and RN educated on wear schedule and encouraged to monitor skin integrity this date.      Pertinent Vitals/Pain Pain Assessment: No/denies pain    Home Living                      Prior Function            PT Goals (current goals can now be found in the care plan section) Acute Rehab PT Goals Patient Stated Goal: To regain as much independence as possible Progress towards PT goals: Progressing toward goals    Frequency    Min 2X/week      PT Plan Current plan remains appropriate    Co-evaluation              AM-PAC PT "6 Clicks" Mobility   Outcome Measure  Help needed turning from your back to your side while in a flat bed without using bedrails?: A Little Help needed moving from lying on your back to sitting on the side of a flat bed without using bedrails?: A Little Help needed moving to and from a bed to a chair (including a wheelchair)?: A Little Help needed standing up from a chair using your arms (e.g., wheelchair or bedside chair)?: A Little Help needed to walk in hospital room?: A Little Help needed climbing 3-5 steps with a railing? : A Lot 6 Click Score: 17    End of Session Equipment Utilized During Treatment: Gait  belt Activity Tolerance: Patient tolerated treatment well Patient left: in bed;with call bell/phone within reach;with bed alarm set   PT Visit Diagnosis: Muscle weakness (generalized) (M62.81);Difficulty in walking, not elsewhere classified (R26.2);Repeated falls (R29.6)     Time: 2119-4174 PT Time Calculation (min) (ACUTE ONLY): 38 min  Charges:  $Gait Training: 8-22 mins $Therapeutic Activity: 23-37 mins                      Larae Grooms, PTA 08/24/2019, 5:20 PM

## 2019-08-24 NOTE — Progress Notes (Signed)
Initial Nutrition Assessment  DOCUMENTATION CODES:   Not applicable  INTERVENTION:  Provide Ensure Enlive po BID, each supplement provides 350 kcal and 20 grams of protein.  Continue MVI daily, thiamine 149 mg daily, folic acid 1 mg daily.  NUTRITION DIAGNOSIS:   Increased nutrient needs related to wound healing as evidenced by estimated needs.  GOAL:   Patient will meet greater than or equal to 90% of their needs  MONITOR:   PO intake, Supplement acceptance, Labs, Weight trends, Skin, I & O's  REASON FOR ASSESSMENT:   LOS    ASSESSMENT:   68 year old male with PMHx of HTN, EtOH abuse admitted with hyponatremia due to beer potomania, hypokalemia, EtOH withdrawal, stage II decubitus.   Spoke with patient over the phone. He reports that PTA and earlier in the admission he had a decreased appetite and intake. He was eating 50% or less of his meals. He reports it has started to increase now. He is now eating 90-100% of his meals. He is unsure when he developed his wound. [name] amenable to drinking ONS to help meet calorie/protein needs.  [name] unsure if he has lost any weight. Per chart weights tend to fluctuate so it is difficult to identify a trend. He is currently 58.1 kg (128 lbs).  Medications reviewed and include: vitamin D3 7026 units daily, folic acid 1 mg daily, lactulose 30 grams TID, MVI daily, thiamine 100 mg daily, vitamin C 1000 mg daily.  Labs reviewed.  NUTRITION - FOCUSED PHYSICAL EXAM:  Unable to complete at this time.  Diet Order:   Diet Order            Diet regular Room service appropriate? Yes; Fluid consistency: Thin  Diet effective now             EDUCATION NEEDS:   No education needs have been identified at this time  Skin:  Skin Assessment: Skin Integrity Issues:(stg II sacrum (1cm x 1cm))  Last BM:  08/23/2019 - small type 6  Height:   Ht Readings from Last 1 Encounters:  08/14/19 5' 4.5" (1.638 m)   Weight:   Wt  Readings from Last 1 Encounters:  08/14/19 58.1 kg   Ideal Body Weight:  60.5 kg  BMI:  Body mass index is 21.63 kg/m.  Estimated Nutritional Needs:   Kcal:  1600-1800  Protein:  80-90 grams  Fluid:  1.6-1.8 L/day  Willey Blade, MS, RD, LDN Office: [phone] Pager: [phone] After Hours/Weekend Pager: [phone]

## 2019-08-24 NOTE — Progress Notes (Signed)
[facility] [place] NAME: Douglas Peterson    MR#:  [id number]  DATE OF BIRTH:  09-Nov-1950  SUBJECTIVE:   Patient states he is doing fine today. He has no concerns. He states he does not want to go to [facility].  REVIEW OF SYSTEMS:    Review of Systems  Constitutional: Positive for malaise/fatigue. Negative for chills and fever.  HENT: Negative.  Negative for ear discharge, ear pain, hearing loss, nosebleeds and sore throat.   Eyes: Negative.  Negative for blurred vision and pain.  Respiratory: Negative.  Negative for cough, hemoptysis, shortness of breath and wheezing.   Cardiovascular: Negative.  Negative for chest pain, palpitations and leg swelling.  Gastrointestinal: Negative.  Negative for abdominal pain, blood in stool, diarrhea, nausea and vomiting.  Genitourinary: Negative.  Negative for dysuria.  Musculoskeletal: Negative.  Negative for back pain.  Skin: Negative.   Neurological: Negative for dizziness, tremors, speech change, focal weakness, seizures and headaches.  Endo/Heme/Allergies: Negative.  Does not bruise/bleed easily.  Psychiatric/Behavioral: Negative.  Negative for depression, hallucinations and suicidal ideas.   DRUG ALLERGIES:  No Known Allergies  VITALS:  Blood pressure 130/82, pulse 64, temperature 97.9 F (36.6 C), temperature source Oral, resp. rate 17, height 5' 4.5" (1.638 m), weight 58.1 kg, SpO2 97 %.  PHYSICAL EXAMINATION:  Constitutional: Appears thin and frail no distress. +chronically ill-appearing. HENT: Normocephalic.  Eyes: no scleral icterus.  Neck: Normal ROM. Neck supple. No JVD.  CVS: RRR, S1/S2 +, no murmurs, no gallops, no carotid bruit.  Pulmonary: Effort and breath sounds normal, no stridor, rhonchi, wheezes, rales.  Abdominal: Soft. BS +, no distension, tenderness, rebound or guarding.  Musculoskeletal: Normal range of motion. No edema and no tenderness.  Neuro:  No focal deficit  Skin:  Skin is warm and dry. No rash noted. Psychiatric: Awake, answering questions appropriately.  LABORATORY PANEL:   CBC Recent Labs  Lab 08/18/19 0554  WBC 4.1  HGB 13.6  HCT 40.0  PLT 92*   ------------------------------------------------------------------------------------------------------------------  Chemistries  Recent Labs  Lab 08/18/19 0554  08/23/19 0337  NA 137   < > 135  K 3.5   < > 4.2  CL 101   < > 103  CO2 26   < > 25  GLUCOSE 96   < > 87  BUN 6*   < > 12  CREATININE 0.39*   < > 0.51*  CALCIUM 8.8*   < > 8.9  MG 1.7  --   --   AST 66*  --   --   ALT 46*  --   --   ALKPHOS 98  --   --   BILITOT 1.1  --   --    < > = values in this interval not displayed.   ------------------------------------------------------------------------------------------------------------------  Cardiac Enzymes No results for input(s): TROPONINI in the last 168 hours. ------------------------------------------------------------------------------------------------------------------  RADIOLOGY:  No results found.   ASSESSMENT AND PLAN:   Hyponatremia due to beer potomania- resolved with IVFs.  Hypokalemia- resolved with repletion.  EtOH abuse with EtOH withdrawal- improved. Received librium and ativan this admission.  Stage II decubitus ulcer on buttock- present on admission. Continue local wound care.  Elevated LFTs- due to chronic EtOH abuse. Monitor as an outpatient.  Essential hypertension- continue atenolol and diltiazem  Awaiting insurance authorization for SNF placement. Daughter, [name], updated via the phone.  Management plans discussed with nurse and patient  CODE STATUS: FULL  TOTAL  TIME TAKING CARE OF THIS PATIENT: 36 minutes.    Berna Spare  M.D on 08/24/2019 at 2:40 PM  Between 7am to 6pm - Pager [phone]  After 6pm go to [email or web address] - password [email or web address] [facility]  Office  [phone]  CC: Primary care physician;  [name], MD  Note: This dictation was prepared with Dragon dictation along with smaller phrase technology. Any transcriptional errors that result from this process are unintentional.

## 2019-08-24 NOTE — Progress Notes (Signed)
Occupational Therapy Treatment Patient Details Name: Douglas Peterson. MRN: [id number] DOB: 1951/09/14 Today's Date: 08/24/2019    History of present illness presented to ER secondary to weakness, diarrhea; admitted for management of acute hyponatremia.   OT comments  Pt seen for OT treatment and assessment of resting hand splint on this date. This OT coordinated with nsg staff prior to OT session this PM to ensure pt would be getting resting hand splint. Pt primary RN stated she had not been given report about any splint for this pt, but agreeable to reaching out to [facility] for splint assessment. Upon this OT arrival to pt room this pm, pt was noted to be resting comfortably in bed with a resting hand splint donned on his RUE. OT assessed placement and positioning of this splint. Pt reports splint is comfortable and happily demonstrates that he has improved digit extension while wearing the splint. OT provided education to pt and RN on splint wear schedule with instructions to wear for no more than 3 hours on and 1 hour off to pt tolerance with regular skin assessments to ensure safety and skin integrity this date. Afterward, pt expressed desire to sit up in recliner. OT provided 1 person hand held assist to support pt to chair this date. Pt demonstrates improved overall functional mobility and balance this date. He is able to stand at EOB and ambulate from EOB to chair with min assist from this therapist to support balance and cueing for safety. Pt making good progress toward goals and continues to benefit from skilled OT services to maximize return to PLOF and minimize risk of future falls, injury, caregiver burden, and readmission. Will continue to follow POC. Discharge recommendation remains appropriate.    Follow Up Recommendations  SNF    Equipment Recommendations  Other (comment)(TBD)    Recommendations for Other Services      Precautions / Restrictions Precautions Precautions:  Fall Precaution Comments: Balance poor/zero when tremors increase Restrictions Weight Bearing Restrictions: No       Mobility Bed Mobility Overal bed mobility: Needs Assistance Bed Mobility: Supine to Sit     Supine to sit: Supervision     General bed mobility comments: Pt comes to sitting from semi-reclined position in bed with supvervision for safety. Pt performs with HOB elevated and increased time/effort but overall is able to complete transfer w/o physical assist from this therapist.  Transfers Overall transfer level: Needs assistance Equipment used: 1 person hand held assist Transfers: Sit to/from Stand Sit to Stand: Min assist         General transfer comment: STS x2 this date with pt able to achieve lift off from bed on both occasions. Pt noed to have R lateral lean, but is able to self-correct with cues. Pt stands at EOB with min assist to support balance and RUE. With this 1 hand held assist he is able to take short steps toward the chair and sits in the chair this date.    Balance Overall balance assessment: Needs assistance Sitting-balance support: No upper extremity supported;Feet supported Sitting balance-Leahy Scale: Fair Sitting balance - Comments: Pt sits EOB to don shoes. Fair static sitting with minimal weight shift during functional activity. When pt able to rest feet on floor BLE tremors decrease.   Standing balance support: Bilateral upper extremity supported;During functional activity Standing balance-Leahy Scale: Fair Standing balance comment: OT engages pt in static and dynamic standing activities at EOB. Given 1 person hand held assist to support RUE and pt  using bed rail with LUE he is able to stand at EOB and march in place. After second STS he is able to take small steps toward room recliner with min assist to maintain balance.                           ADL either performed or assessed with clinical judgement   ADL Overall ADL's :  Needs assistance/impaired                     Lower Body Dressing: Set up;Moderate assistance Lower Body Dressing Details (indicate cue type and reason): Pt requries moderate assistance to don his shoes this date.               General ADL Comments: Pt educated on care for resting hand splint (provided by [facility] this date). Pt and RN educated on wear schedule and regular skin checks. Pt verbalizes understanding of education provided.     Vision Baseline Vision/History: No visual deficits Patient Visual Report: No change from baseline     Perception     Praxis      Cognition Arousal/Alertness: Awake/alert Behavior During Therapy: WFL for tasks assessed/performed Overall Cognitive Status: Within Functional Limits for tasks assessed                                 General Comments: Pt demonstrates improved overall cognitive status since past sessions. He is A&O x4 this date, and appears more aware of his functional deficits. He demonstrates improved overall safety awareness and follows 1 step VCs consistently t/o session.        Exercises Other Exercises Other Exercises: Pt stands and ambulates from EOB to recliner given one person hand held assist this date. Other Exercises: Pt educated on mgt of his resting hand splint including wear schedule, considerations for skin integrity, and hand/elbow ROM exercises.   Shoulder Instructions       General Comments Pt left with resting hand splint in place. Pt and RN educated on wear schedule and encouraged to monitor skin integrity this date.    Pertinent Vitals/ Pain       Pain Assessment: No/denies pain  Home Living                                          Prior Functioning/Environment              Frequency  Min 2X/week        Progress Toward Goals  OT Goals(current goals can now be found in the care plan section)  Progress towards OT goals: Progressing toward  goals  Acute Rehab OT Goals Patient Stated Goal: To regain as much independence as possible OT Goal Formulation: With patient Time For Goal Achievement: 08/29/19 Potential to Achieve Goals: Good  Plan Discharge plan remains appropriate;Frequency remains appropriate    Co-evaluation                 AM-PAC OT "6 Clicks" Daily Activity     Outcome Measure   Help from another person eating meals?: A Little Help from another person taking care of personal grooming?: A Little Help from another person toileting, which includes using toliet, bedpan, or urinal?: A Lot Help from another person bathing (including washing,  rinsing, drying)?: A Lot Help from another person to put on and taking off regular upper body clothing?: A Lot Help from another person to put on and taking off regular lower body clothing?: A Lot 6 Click Score: 14    End of Session Equipment Utilized During Treatment: Gait belt  OT Visit Diagnosis: Other abnormalities of gait and mobility (R26.89);History of falling (Z91.81)   Activity Tolerance Patient tolerated treatment well   Patient Left with call bell/phone within reach;in chair;with chair alarm set   Nurse Communication Other (comment);Mobility status(wear schedule for resting hand splint. Mobility status.)        Time: 6825-7493 OT Time Calculation (min): 24 min  Charges: OT General Charges $OT Visit: 1 Visit OT Treatments $Self Care/Home Management : 23-37 mins  Shara Blazing, M.S., OTR/L Ascom: [phone] 08/24/19, 4:02 PM

## 2019-08-25 MED ORDER — ENSURE ENLIVE PO LIQD: 237.0000 mL | Freq: Two times a day (BID) | ORAL | 12 refills | Status: DC

## 2019-08-25 NOTE — Progress Notes (Signed)
Physical Therapy Treatment Patient Details Name: Douglas Peterson. MRN: [id number] DOB: 20-Nov-1950 Today's Date: 08/25/2019    History of Present Illness 68 y/o male presented to ER secondary to weakness, diarrhea; admitted for management of acute hyponatremia.    PT Comments    Pt did much better with ambulation, activity tolerance this date.  He typically uses only a SPC and despite being slow is able to be out in the community, etc.  He remains weak and is not at his baseline but he showed enough mobility and safety to be able to return home.  PT recommends continued use of the walker for now as well as HHPT to insure he continues improving gait/mobility/safety back to baseline.  [name] sister helps him with errands, etc and he reports she will be able to continue helping him in this role.   Follow Up Recommendations  Home health PT     Equipment Recommendations  None recommended by PT    Recommendations for Other Services       Precautions / Restrictions Precautions Precautions: Fall Restrictions Weight Bearing Restrictions: No    Mobility  Bed Mobility               General bed mobility comments: pt in recliner on arrival, bed mobility not tested  Transfers Overall transfer level: Modified independent Equipment used: Rolling walker (2 wheeled) Transfers: Sit to/from Stand Sit to Stand: Modified independent (Device/Increase time)         General transfer comment: Pt was able to rise w/o assist and w/o AD, seemed to be leaning back of LEs on recliner but no LOBs until PT could get RW to him  Ambulation/Gait Ambulation/Gait assistance: Min assist Gait Distance (Feet): 450 Feet Assistive device: Rolling walker (2 wheeled)       General Gait Details: Slow pace but near baseline speed.  Vitals remained stable t/o the effort, he was eager to do multiple laps and see how far he could go.  Trial with single HHA with poor balance and safety (uses cane at baseline)   suggested to keep using walker initially when he goes home.   Stairs             Wheelchair Mobility    Modified Rankin (Stroke Patients Only)       Balance Overall balance assessment: Needs assistance Sitting-balance support: Bilateral upper extremity supported;Feet supported;No upper extremity supported Sitting balance-Leahy Scale: Good     Standing balance support: Bilateral upper extremity supported;During functional activity Standing balance-Leahy Scale: Fair Standing balance comment: Pt was forward leaning, but safe on the walker t/o standing bouts.                            Cognition Arousal/Alertness: Awake/alert Behavior During Therapy: WFL for tasks assessed/performed Overall Cognitive Status: Within Functional Limits for tasks assessed                                        Exercises      General Comments        Pertinent Vitals/Pain Pain Assessment: No/denies pain    Home Living                      Prior Function            PT Goals (current goals can now  be found in the care plan section) Progress towards PT goals: Progressing toward goals    Frequency    Min 2X/week      PT Plan Discharge plan needs to be updated    Co-evaluation              AM-PAC PT "6 Clicks" Mobility   Outcome Measure  Help needed turning from your back to your side while in a flat bed without using bedrails?: None Help needed moving from lying on your back to sitting on the side of a flat bed without using bedrails?: None Help needed moving to and from a bed to a chair (including a wheelchair)?: None Help needed standing up from a chair using your arms (e.g., wheelchair or bedside chair)?: None Help needed to walk in hospital room?: A Little Help needed climbing 3-5 steps with a railing? : A Little 6 Click Score: 22    End of Session Equipment Utilized During Treatment: Gait belt Activity Tolerance: Patient  tolerated treatment well Patient left: with call bell/phone within reach;with chair alarm set Nurse Communication: Mobility status PT Visit Diagnosis: Muscle weakness (generalized) (M62.81);Difficulty in walking, not elsewhere classified (R26.2);Repeated falls (R29.6)     Time: 0347-4259 PT Time Calculation (min) (ACUTE ONLY): 29 min  Charges:  $Gait Training: 8-22 mins $Therapeutic Activity: 8-22 mins                     Kreg Shropshire, DPT 08/25/2019, 4:35 PM
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
## 2019-08-25 NOTE — Care Management Important Message (Signed)
Important Message  Patient Details  Name: Douglas Peterson. MRN: [id number] Date of Birth: [birth year]   Medicare Important Message Given:  Yes     Juliann Pulse A  08/25/2019, 11:27 AM

## 2019-08-25 NOTE — TOC Transition Note (Signed)
Transition of Care [facility]) - CM/SW Discharge Note   Patient Details  Name: Douglas Peterson. MRN: [id number] Date of Birth: Jan 31, 1951  Transition of [facility]) CM/SW Contact:  Candie Chroman, LCSW Phone Number: 08/25/2019, 3:52 PM   Clinical Narrative: Patient walked 450 feet with PT today. [employer] Medicare will not cover SNF stay with him ambulating this far. Plan is to return home with home health through [facility] (PT, [place], RN) today. Patient's daughter and sister are aware and agreeable. Sister will pick him up in about an hour. MD and RN aware. No further concerns. CSW signing off.    Final next level of care: Home w Home Health Services Barriers to Discharge: Barriers Resolved   Patient Goals and CMS Choice     Choice offered to / list presented to : NA  Discharge Placement                Patient to be transferred to facility by: Sister will pick him up. Name of family member notified: [name], Lance Sell Patient and family notified of of transfer: 08/25/19  Discharge Plan and Services     Post Acute Care Choice: Home Health                    HH Arranged: RN, PT, OT [facility] Agency: [facility] Date [place]: 08/25/19   Representative spoke with at [place]: [place] (SDOH) Interventions     Readmission Risk Interventions No flowsheet data found.

## 2019-08-25 NOTE — Progress Notes (Signed)
[facility] [place] NAME: Douglas Peterson    MR#:  [id number]  DATE OF BIRTH:  [birth year]  SUBJECTIVE:   Patient states he is feeling fine this morning.  He worked with physical therapy yesterday afternoon, and it went well.  He states he was able to walk.  He endorses feeling overall weak.  He is agreeable to possibly going to SNF, however he states he would like to work with physical therapy again today to see if he can do even better than yesterday.  REVIEW OF SYSTEMS:    Review of Systems  Constitutional: Positive for malaise/fatigue. Negative for chills and fever.  HENT: Negative.  Negative for ear discharge, ear pain, hearing loss, nosebleeds and sore throat.   Eyes: Negative.  Negative for blurred vision and pain.  Respiratory: Negative.  Negative for cough, hemoptysis, shortness of breath and wheezing.   Cardiovascular: Negative.  Negative for chest pain, palpitations and leg swelling.  Gastrointestinal: Negative.  Negative for abdominal pain, blood in stool, diarrhea, nausea and vomiting.  Genitourinary: Negative.  Negative for dysuria.  Musculoskeletal: Negative.  Negative for back pain.  Skin: Negative.   Neurological: Negative for dizziness, tremors, speech change, focal weakness, seizures and headaches.  Endo/Heme/Allergies: Negative.  Does not bruise/bleed easily.  Psychiatric/Behavioral: Negative.  Negative for depression, hallucinations and suicidal ideas.   DRUG ALLERGIES:  No Known Allergies  VITALS:  Blood pressure 134/80, pulse 60, temperature 98.7 F (37.1 C), temperature source Oral, resp. rate 17, height 5' 4.5" (1.638 m), weight 58.1 kg, SpO2 100 %.  PHYSICAL EXAMINATION:  Constitutional: Appears thin and frail no distress. +chronically ill-appearing. HENT: Normocephalic.  Eyes: no scleral icterus.  Neck: Normal ROM. Neck supple. No JVD.  CVS: RRR, S1/S2 +, no murmurs, no gallops, no carotid bruit.  Pulmonary:  Effort and breath sounds normal, no stridor, rhonchi, wheezes, rales.  Abdominal: Soft. BS +, no distension, tenderness, rebound or guarding.  Musculoskeletal: Normal range of motion. No edema and no tenderness.  Neuro:  No focal deficit  Skin: Skin is warm and dry. No rash noted. Psychiatric: Awake, answering questions appropriately.  LABORATORY PANEL:   CBC No results for input(s): WBC, HGB, HCT, PLT in the last 168 hours. ------------------------------------------------------------------------------------------------------------------  Chemistries  Recent Labs  Lab 08/23/19 0337  NA 135  K 4.2  CL 103  CO2 25  GLUCOSE 87  BUN 12  CREATININE 0.51*  CALCIUM 8.9   ------------------------------------------------------------------------------------------------------------------  Cardiac Enzymes No results for input(s): TROPONINI in the last 168 hours. ------------------------------------------------------------------------------------------------------------------  RADIOLOGY:  No results found.   ASSESSMENT AND PLAN:   Hyponatremia due to beer potomania- resolved with IVFs.  Hypokalemia- resolved with repletion.  EtOH abuse with EtOH withdrawal- improved. Received librium and ativan this admission.  Stage II decubitus ulcer on buttock- present on admission. Continue local wound care.  Elevated LFTs- due to chronic EtOH abuse. Monitor as an outpatient.  Essential hypertension- continue atenolol and diltiazem  Awaiting insurance authorization for SNF placement. Daughter, [name], updated via the phone.  Management plans discussed with nurse and patient  CODE STATUS: FULL  TOTAL TIME TAKING CARE OF THIS PATIENT: 32 minutes.    Berna Spare  M.D on 08/25/2019 at 1:05 PM  Between 7am to 6pm - Pager - [phone]  After 6pm go to [email or web address] - password [email or web address] [facility]  Office  [phone]  CC: Primary care physician; [name], MD  Note: This dictation was prepared  with Dragon dictation along with smaller phrase technology. Any transcriptional errors that result from this process are unintentional.

## 2019-09-20 ENCOUNTER — Other Ambulatory Visit: Payer: Self-pay

## 2019-09-20 ENCOUNTER — Inpatient Hospital Stay
Admission: EM | Admit: 2019-09-20 | Discharge: 2019-10-03 | DRG: 896 | Disposition: A | Discharge status: Skilled Nursing Facility | Payer: Medicare HMO | Attending: Internal Medicine | Admitting: Internal Medicine

## 2019-09-20 ENCOUNTER — Encounter: Payer: Self-pay | Admitting: *Deleted

## 2019-09-20 ENCOUNTER — Emergency Department: Payer: Medicare HMO

## 2019-09-20 DIAGNOSIS — F1010 Alcohol abuse, uncomplicated: Secondary | ICD-10-CM

## 2019-09-20 DIAGNOSIS — R296 Repeated falls: Secondary | ICD-10-CM | POA: Diagnosis present

## 2019-09-20 DIAGNOSIS — I10 Essential (primary) hypertension: Secondary | ICD-10-CM | POA: Diagnosis present

## 2019-09-20 DIAGNOSIS — Z20828 Contact with and (suspected) exposure to other viral communicable diseases: Secondary | ICD-10-CM | POA: Diagnosis present

## 2019-09-20 DIAGNOSIS — R1310 Dysphagia, unspecified: Secondary | ICD-10-CM

## 2019-09-20 DIAGNOSIS — G9341 Metabolic encephalopathy: Secondary | ICD-10-CM | POA: Diagnosis not present

## 2019-09-20 DIAGNOSIS — F10231 Alcohol dependence with withdrawal delirium: Secondary | ICD-10-CM | POA: Diagnosis not present

## 2019-09-20 DIAGNOSIS — F10931 Alcohol use, unspecified with withdrawal delirium: Secondary | ICD-10-CM | POA: Diagnosis present

## 2019-09-20 DIAGNOSIS — F10929 Alcohol use, unspecified with intoxication, unspecified: Secondary | ICD-10-CM

## 2019-09-20 DIAGNOSIS — Z681 Body mass index (BMI) 19 or less, adult: Secondary | ICD-10-CM

## 2019-09-20 DIAGNOSIS — Z801 Family history of malignant neoplasm of trachea, bronchus and lung: Secondary | ICD-10-CM

## 2019-09-20 DIAGNOSIS — R64 Cachexia: Secondary | ICD-10-CM | POA: Diagnosis present

## 2019-09-20 DIAGNOSIS — Z87820 Personal history of traumatic brain injury: Secondary | ICD-10-CM

## 2019-09-20 DIAGNOSIS — E871 Hypo-osmolality and hyponatremia: Secondary | ICD-10-CM | POA: Diagnosis present

## 2019-09-20 DIAGNOSIS — Z8249 Family history of ischemic heart disease and other diseases of the circulatory system: Secondary | ICD-10-CM

## 2019-09-20 DIAGNOSIS — F10229 Alcohol dependence with intoxication, unspecified: Secondary | ICD-10-CM | POA: Diagnosis present

## 2019-09-20 DIAGNOSIS — F10939 Alcohol use, unspecified with withdrawal, unspecified: Secondary | ICD-10-CM

## 2019-09-20 DIAGNOSIS — F1020 Alcohol dependence, uncomplicated: Secondary | ICD-10-CM | POA: Diagnosis present

## 2019-09-20 DIAGNOSIS — D72829 Elevated white blood cell count, unspecified: Secondary | ICD-10-CM | POA: Diagnosis present

## 2019-09-20 DIAGNOSIS — E440 Moderate protein-calorie malnutrition: Secondary | ICD-10-CM | POA: Diagnosis present

## 2019-09-20 DIAGNOSIS — D6959 Other secondary thrombocytopenia: Secondary | ICD-10-CM | POA: Diagnosis present

## 2019-09-20 DIAGNOSIS — Z7982 Long term (current) use of aspirin: Secondary | ICD-10-CM

## 2019-09-20 DIAGNOSIS — E519 Thiamine deficiency, unspecified: Secondary | ICD-10-CM | POA: Diagnosis present

## 2019-09-20 DIAGNOSIS — E860 Dehydration: Secondary | ICD-10-CM | POA: Diagnosis present

## 2019-09-20 DIAGNOSIS — Y908 Blood alcohol level of 240 mg/100 ml or more: Secondary | ICD-10-CM | POA: Diagnosis present

## 2019-09-20 DIAGNOSIS — Z79899 Other long term (current) drug therapy: Secondary | ICD-10-CM

## 2019-09-20 DIAGNOSIS — E876 Hypokalemia: Secondary | ICD-10-CM | POA: Diagnosis present

## 2019-09-20 DIAGNOSIS — F10239 Alcohol dependence with withdrawal, unspecified: Secondary | ICD-10-CM

## 2019-09-20 DIAGNOSIS — Z87891 Personal history of nicotine dependence: Secondary | ICD-10-CM

## 2019-09-20 LAB — COMPREHENSIVE METABOLIC PANEL
ALT: 41 U/L (ref 0–44)
AST: 60 U/L — ABNORMAL HIGH (ref 15–41)
Albumin: 4.8 g/dL (ref 3.5–5.0)
Alkaline Phosphatase: 128 U/L — ABNORMAL HIGH (ref 38–126)
Anion gap: 17 — ABNORMAL HIGH (ref 5–15)
BUN: 7 mg/dL — ABNORMAL LOW (ref 8–23)
CO2: 27 mmol/L (ref 22–32)
Calcium: 9.1 mg/dL (ref 8.9–10.3)
Chloride: 90 mmol/L — ABNORMAL LOW (ref 98–111)
Creatinine, Ser: 0.43 mg/dL — ABNORMAL LOW (ref 0.61–1.24)
GFR calc Af Amer: >60 mL/min (ref >60)
GFR calc non Af Amer: >60 mL/min (ref >60)
Glucose, Bld: 103 mg/dL — ABNORMAL HIGH (ref 70–99)
Potassium: 4.1 mmol/L (ref 3.5–5.1)
Sodium: 134 mmol/L — ABNORMAL LOW (ref 135–145)
Total Bilirubin: 1 mg/dL (ref 0.3–1.2)
Total Protein: 8.7 g/dL — ABNORMAL HIGH (ref 6.5–8.1)

## 2019-09-20 LAB — CBC
HCT: 47.5 % (ref 39.0–52.0)
Hemoglobin: 16.4 g/dL (ref 13.0–17.0)
MCH: 31.9 pg (ref 26.0–34.0)
MCHC: 34.5 g/dL (ref 30.0–36.0)
MCV: 92.4 fL (ref 80.0–100.0)
Platelets: 144 10*3/uL — ABNORMAL LOW (ref 150–400)
RBC: 5.14 MIL/uL (ref 4.22–5.81)
RDW: 12.4 % (ref 11.5–15.5)
WBC: 12.1 10*3/uL — ABNORMAL HIGH (ref 4.0–10.5)
nRBC: 0 % (ref 0.0–0.2)

## 2019-09-20 LAB — ETHANOL: Alcohol, Ethyl (B): 310 mg/dL (ref <10)

## 2019-09-20 MED ORDER — LORAZEPAM 2 MG/ML IJ SOLN: 0.0000 mg | Freq: Two times a day (BID) | INTRAMUSCULAR | Status: DC

## 2019-09-20 MED ORDER — ONDANSETRON 4 MG PO TBDP
4.0000 mg | ORAL_TABLET | Freq: Once | ORAL | Status: AC
  Administered 2019-09-20: 4 mg via ORAL
  Filled 2019-09-20: qty 1

## 2019-09-20 MED ORDER — LORAZEPAM 2 MG/ML IJ SOLN
0.0000 mg | Freq: Four times a day (QID) | INTRAMUSCULAR | Status: DC
  Administered 2019-09-22: 01:00:00 1 mg via INTRAVENOUS
  Filled 2019-09-20: qty 1

## 2019-09-20 MED ORDER — VITAMIN B-1 100 MG PO TABS
500.0000 mg | ORAL_TABLET | Freq: Once | ORAL | Status: AC
  Administered 2019-09-20: 22:00:00 500 mg via ORAL
  Filled 2019-09-20: qty 5

## 2019-09-20 MED ORDER — LORAZEPAM 2 MG PO TABS
0.0000 mg | ORAL_TABLET | Freq: Four times a day (QID) | ORAL | Status: DC
  Administered 2019-09-20 – 2019-09-21 (×3): 2 mg via ORAL
  Filled 2019-09-20: qty 2
  Filled 2019-09-20 (×2): qty 1

## 2019-09-20 MED ORDER — LORAZEPAM 2 MG PO TABS: 0.0000 mg | ORAL_TABLET | Freq: Two times a day (BID) | ORAL | Status: DC

## 2019-09-20 NOTE — ED Notes (Signed)
Patient states he drinks 20 beers a day. He was trying to stop but started having withdraw symptoms, so he bought a case of beer and drank it. Patient states he is wanting to detox off alc. He is complaining of nausea.

## 2019-09-20 NOTE — ED Notes (Signed)
Pt reports continued nausea. No active vomiting. Pt notified will be place into bed as soon as bed is available. Will continue to monitor.
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
## 2019-09-20 NOTE — ED Notes (Signed)
Pt sitting in lobby with no distress noted; updated on wait time; pt st he is going to "throw himself down on the floor if he doesn't get to lay down soon"; explained to pt purpose of triage and acuity and that he would get a room when one is available; recommended that pt remain in w/c for his safety and not attempt to "throw himself on the floor" in an attempt to get to a room sooner

## 2019-09-20 NOTE — ED Notes (Signed)
Referral information for Detox has been faxed to;   . [facility]     . [facility] [phone])

## 2019-09-20 NOTE — ED Triage Notes (Signed)
Pt in via EMS from home for alcohol detox. EMS reports pt had approx 50 beers around here and admitted to drinking 13 today. EMS reports pt gets nauseated and drinks to take the nausea away. EMS reports pt has urinated on himself.

## 2019-09-21 DIAGNOSIS — Y908 Blood alcohol level of 240 mg/100 ml or more: Secondary | ICD-10-CM | POA: Diagnosis present

## 2019-09-21 DIAGNOSIS — F10229 Alcohol dependence with intoxication, unspecified: Secondary | ICD-10-CM | POA: Diagnosis present

## 2019-09-21 DIAGNOSIS — D6959 Other secondary thrombocytopenia: Secondary | ICD-10-CM | POA: Diagnosis present

## 2019-09-21 DIAGNOSIS — F10231 Alcohol dependence with withdrawal delirium: Secondary | ICD-10-CM | POA: Diagnosis present

## 2019-09-21 DIAGNOSIS — F1010 Alcohol abuse, uncomplicated: Secondary | ICD-10-CM | POA: Diagnosis present

## 2019-09-21 DIAGNOSIS — E519 Thiamine deficiency, unspecified: Secondary | ICD-10-CM | POA: Diagnosis present

## 2019-09-21 DIAGNOSIS — Z20828 Contact with and (suspected) exposure to other viral communicable diseases: Secondary | ICD-10-CM | POA: Diagnosis present

## 2019-09-21 DIAGNOSIS — F10931 Alcohol use, unspecified with withdrawal delirium: Secondary | ICD-10-CM | POA: Diagnosis present

## 2019-09-21 DIAGNOSIS — Z87820 Personal history of traumatic brain injury: Secondary | ICD-10-CM | POA: Diagnosis not present

## 2019-09-21 DIAGNOSIS — Z7982 Long term (current) use of aspirin: Secondary | ICD-10-CM | POA: Diagnosis not present

## 2019-09-21 DIAGNOSIS — G9341 Metabolic encephalopathy: Secondary | ICD-10-CM | POA: Diagnosis not present

## 2019-09-21 DIAGNOSIS — Z87891 Personal history of nicotine dependence: Secondary | ICD-10-CM | POA: Diagnosis not present

## 2019-09-21 DIAGNOSIS — I10 Essential (primary) hypertension: Secondary | ICD-10-CM | POA: Diagnosis present

## 2019-09-21 DIAGNOSIS — R64 Cachexia: Secondary | ICD-10-CM | POA: Diagnosis present

## 2019-09-21 DIAGNOSIS — Z681 Body mass index (BMI) 19 or less, adult: Secondary | ICD-10-CM | POA: Diagnosis not present

## 2019-09-21 DIAGNOSIS — Z8249 Family history of ischemic heart disease and other diseases of the circulatory system: Secondary | ICD-10-CM | POA: Diagnosis not present

## 2019-09-21 DIAGNOSIS — Z79899 Other long term (current) drug therapy: Secondary | ICD-10-CM | POA: Diagnosis not present

## 2019-09-21 DIAGNOSIS — F10239 Alcohol dependence with withdrawal, unspecified: Secondary | ICD-10-CM | POA: Diagnosis not present

## 2019-09-21 DIAGNOSIS — R296 Repeated falls: Secondary | ICD-10-CM | POA: Diagnosis present

## 2019-09-21 DIAGNOSIS — E876 Hypokalemia: Secondary | ICD-10-CM | POA: Diagnosis present

## 2019-09-21 DIAGNOSIS — E871 Hypo-osmolality and hyponatremia: Secondary | ICD-10-CM | POA: Diagnosis present

## 2019-09-21 DIAGNOSIS — E440 Moderate protein-calorie malnutrition: Secondary | ICD-10-CM | POA: Diagnosis present

## 2019-09-21 DIAGNOSIS — D72829 Elevated white blood cell count, unspecified: Secondary | ICD-10-CM | POA: Diagnosis present

## 2019-09-21 DIAGNOSIS — R1310 Dysphagia, unspecified: Secondary | ICD-10-CM | POA: Diagnosis present

## 2019-09-21 DIAGNOSIS — E860 Dehydration: Secondary | ICD-10-CM | POA: Diagnosis present

## 2019-09-21 DIAGNOSIS — Z801 Family history of malignant neoplasm of trachea, bronchus and lung: Secondary | ICD-10-CM | POA: Diagnosis not present

## 2019-09-21 DIAGNOSIS — F10929 Alcohol use, unspecified with intoxication, unspecified: Secondary | ICD-10-CM | POA: Diagnosis not present

## 2019-09-21 LAB — URINE DRUG SCREEN, QUALITATIVE (ARMC ONLY)
Amphetamines, Ur Screen: NOT DETECTED
Barbiturates, Ur Screen: NOT DETECTED
Benzodiazepine, Ur Scrn: POSITIVE — AB
Cannabinoid 50 Ng, Ur ~~LOC~~: NOT DETECTED
Cocaine Metabolite,Ur ~~LOC~~: NOT DETECTED
MDMA (Ecstasy)Ur Screen: NOT DETECTED
Methadone Scn, Ur: NOT DETECTED
Opiate, Ur Screen: NOT DETECTED
Phencyclidine (PCP) Ur S: NOT DETECTED
Tricyclic, Ur Screen: NOT DETECTED

## 2019-09-21 LAB — SARS CORONAVIRUS 2 (TAT 6-24 HRS): SARS Coronavirus 2: NEGATIVE

## 2019-09-21 MED ORDER — CEFTRIAXONE SODIUM 1 G IJ SOLR
1.0000 g | INTRAMUSCULAR | Status: DC
  Administered 2019-09-22 (×2): 1 g via INTRAMUSCULAR
  Filled 2019-09-21 (×3): qty 10

## 2019-09-21 MED ORDER — ADULT MULTIVITAMIN W/MINERALS CH
1.0000 | ORAL_TABLET | Freq: Every day | ORAL | Status: DC
  Filled 2019-09-21: qty 1

## 2019-09-21 MED ORDER — FOLIC ACID 1 MG PO TABS
500.0000 ug | ORAL_TABLET | Freq: Every day | ORAL | Status: DC
  Filled 2019-09-21: qty 1

## 2019-09-21 MED ORDER — VITAMIN C 500 MG PO TABS
1000.0000 mg | ORAL_TABLET | Freq: Every day | ORAL | Status: DC
  Administered 2019-09-24 – 2019-10-03 (×10): 1000 mg via ORAL
  Filled 2019-09-21 (×12): qty 2

## 2019-09-21 MED ORDER — HALOPERIDOL 5 MG PO TABS: 5.0000 mg | ORAL_TABLET | Freq: Once | ORAL | Status: DC

## 2019-09-21 MED ORDER — LORAZEPAM 2 MG PO TABS: 0.0000 mg | ORAL_TABLET | Freq: Three times a day (TID) | ORAL | Status: DC

## 2019-09-21 MED ORDER — SODIUM CHLORIDE 0.9% FLUSH
3.0000 mL | Freq: Two times a day (BID) | INTRAVENOUS | Status: DC
  Administered 2019-09-22 – 2019-09-23 (×3): 3 mL via INTRAVENOUS

## 2019-09-21 MED ORDER — LORAZEPAM 2 MG PO TABS
2.0000 mg | ORAL_TABLET | Freq: Once | ORAL | Status: AC
  Administered 2019-09-21: 2 mg via ORAL
  Filled 2019-09-21: qty 1

## 2019-09-21 MED ORDER — CHLORDIAZEPOXIDE HCL 25 MG PO CAPS
50.0000 mg | ORAL_CAPSULE | Freq: Once | ORAL | Status: AC
  Administered 2019-09-21: 50 mg via ORAL
  Filled 2019-09-21: qty 2

## 2019-09-21 MED ORDER — ASPIRIN EC 81 MG PO TBEC
81.0000 mg | DELAYED_RELEASE_TABLET | Freq: Every day | ORAL | Status: DC
  Administered 2019-09-22 – 2019-10-03 (×11): 81 mg via ORAL
  Filled 2019-09-21 (×11): qty 1

## 2019-09-21 MED ORDER — ATENOLOL 25 MG PO TABS: 25.0000 mg | ORAL_TABLET | Freq: Every evening | ORAL | Status: DC

## 2019-09-21 MED ORDER — VITAMIN B-1 100 MG PO TABS
100.0000 mg | ORAL_TABLET | Freq: Every day | ORAL | Status: DC
  Filled 2019-09-21: qty 1

## 2019-09-21 MED ORDER — BACLOFEN 10 MG PO TABS: 10.0000 mg | ORAL_TABLET | Freq: Three times a day (TID) | ORAL | Status: DC

## 2019-09-21 MED ORDER — LORAZEPAM 1 MG PO TABS: 1.0000 mg | ORAL_TABLET | ORAL | Status: DC | PRN

## 2019-09-21 MED ORDER — ENSURE ENLIVE PO LIQD
237.0000 mL | Freq: Two times a day (BID) | ORAL | Status: DC
  Administered 2019-09-23 – 2019-09-27 (×7): 237 mL via ORAL

## 2019-09-21 MED ORDER — PAROXETINE HCL 20 MG PO TABS
20.0000 mg | ORAL_TABLET | Freq: Every day | ORAL | Status: DC
  Administered 2019-09-21: 20 mg via ORAL
  Filled 2019-09-21: qty 1

## 2019-09-21 MED ORDER — ATENOLOL 25 MG PO TABS
25.0000 mg | ORAL_TABLET | Freq: Every day | ORAL | Status: DC
  Administered 2019-09-21: 25 mg via ORAL
  Filled 2019-09-21: qty 1

## 2019-09-21 MED ORDER — HYDROXYZINE HCL 25 MG PO TABS
25.0000 mg | ORAL_TABLET | Freq: Three times a day (TID) | ORAL | Status: DC | PRN
  Administered 2019-09-27 – 2019-09-29 (×3): 25 mg via ORAL
  Filled 2019-09-21 (×4): qty 1

## 2019-09-21 MED ORDER — SODIUM CHLORIDE 0.9 % IV SOLN: 250.0000 mL | INTRAVENOUS | Status: DC | PRN

## 2019-09-21 MED ORDER — DILTIAZEM HCL ER COATED BEADS 120 MG PO CP24
120.0000 mg | ORAL_CAPSULE | Freq: Every day | ORAL | Status: DC
  Administered 2019-09-21: 120 mg via ORAL
  Filled 2019-09-21: qty 1

## 2019-09-21 MED ORDER — BACLOFEN 10 MG PO TABS
10.0000 mg | ORAL_TABLET | Freq: Three times a day (TID) | ORAL | Status: DC
  Administered 2019-09-21 – 2019-09-22 (×2): 10 mg via ORAL
  Filled 2019-09-21 (×6): qty 1

## 2019-09-21 MED ORDER — DILTIAZEM HCL ER COATED BEADS 120 MG PO CP24
120.0000 mg | ORAL_CAPSULE | Freq: Every day | ORAL | Status: DC
  Filled 2019-09-21: qty 1

## 2019-09-21 MED ORDER — HALOPERIDOL LACTATE 5 MG/ML IJ SOLN
5.0000 mg | Freq: Once | INTRAMUSCULAR | Status: AC
  Administered 2019-09-21: 5 mg via INTRAMUSCULAR
  Filled 2019-09-21: qty 1

## 2019-09-21 MED ORDER — DOXEPIN HCL 10 MG PO CAPS
10.0000 mg | ORAL_CAPSULE | Freq: Every evening | ORAL | Status: DC | PRN
  Filled 2019-09-21: qty 1

## 2019-09-21 MED ORDER — LORAZEPAM 2 MG/ML IJ SOLN
4.0000 mg | Freq: Once | INTRAMUSCULAR | Status: AC
  Administered 2019-09-21: 4 mg via INTRAMUSCULAR

## 2019-09-21 MED ORDER — LORAZEPAM 2 MG/ML IJ SOLN
4.0000 mg | Freq: Once | INTRAMUSCULAR | Status: DC
  Filled 2019-09-21: qty 2

## 2019-09-21 MED ORDER — LORAZEPAM 2 MG/ML IJ SOLN: 1.0000 mg | INTRAMUSCULAR | Status: DC | PRN

## 2019-09-21 MED ORDER — SODIUM CHLORIDE 0.9% FLUSH: 3.0000 mL | INTRAVENOUS | Status: DC | PRN

## 2019-09-21 MED ORDER — PAROXETINE HCL 30 MG PO TABS
30.0000 mg | ORAL_TABLET | Freq: Every day | ORAL | Status: DC
  Administered 2019-09-22 – 2019-10-03 (×11): 30 mg via ORAL
  Filled 2019-09-21 (×13): qty 1

## 2019-09-21 MED ORDER — LORAZEPAM 1 MG PO TABS
1.0000 mg | ORAL_TABLET | ORAL | Status: DC
  Administered 2019-09-22 – 2019-09-23 (×4): 1 mg via ORAL
  Filled 2019-09-21 (×5): qty 1

## 2019-09-21 MED ORDER — PHENOBARBITAL 20 MG/5ML PO ELIX
90.0000 mg | ORAL_SOLUTION | ORAL | Status: AC
  Administered 2019-09-21: 90 mg via ORAL
  Filled 2019-09-21: qty 22.5

## 2019-09-21 NOTE — ED Notes (Signed)
Pt given lunch tray and a cup of ginger ale.
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
## 2019-09-21 NOTE — BH Assessment (Addendum)
Referral information forDetox has beenfaxed to:  [facility]: DENIED   [facility]: DENIED due to inability to ambulate & complete ADLs

## 2019-09-21 NOTE — ED Notes (Signed)
Pt soiled self and bed with grape juice. Bed linen changed, pt brief changed and clean gown placed on pt.

## 2019-09-21 NOTE — ED Notes (Signed)
Patient currently resting with eyes closed. Respirations even and non labored. Will continue to monitor.

## 2019-09-21 NOTE — BH Assessment (Signed)
Referral information for Detox has been faxed to:   [facility]    [facility]

## 2019-09-21 NOTE — ED Notes (Signed)
Attempted to place IV, unable to start IV.  MD notified, orders issued.

## 2019-09-21 NOTE — ED Provider Notes (Signed)
-----------------------------------------   4:54 AM on 09/21/2019 -----------------------------------------   Blood pressure (!) 160/88, pulse (!) 101, temperature 98.2 F (36.8 C), temperature source Oral, resp. rate 18, height 1.6 m (5' 3" ), weight 53.5 kg, SpO2 96 %.  The patient is calm and cooperative at this time.  There have been no acute events since the last update.  Awaiting recommendations for detox from TTS.   Hinda Kehr, MD 09/21/19 [phone]

## 2019-09-21 NOTE — ED Provider Notes (Signed)
.  Critical Care Performed by: Carrie Mew, MD Authorized by: Carrie Mew, MD   Critical care provider statement:    Critical care time (minutes):  35   Critical care time was exclusive of:  Separately billable procedures and treating other patients   Critical care was necessary to treat or prevent imminent or life-threatening deterioration of the following conditions:  CNS failure or compromise and toxidrome   Critical care was time spent personally by me on the following activities:  Development of treatment plan with patient or surrogate, discussions with consultants, evaluation of patient's response to treatment, examination of patient, obtaining history from patient or surrogate, ordering and performing treatments and interventions, ordering and review of laboratory studies, ordering and review of radiographic studies, pulse oximetry, re-evaluation of patient's condition and review of old charts    Clinical Course as of Sep 20 2005  Wed Sep 21, 2019  1905 [name] having persistent agitation, hypertension, tachycardia despite receiving his blood pressure medicines and earlier doses of oral Ativan 2 mg and Librium 50 mg.  We will give him 90 mg of oral phenobarbital and 5 mg of IM Haldol.   [PS]  1957 Despite multiple attempts at managing with CIWA protocol, patient is having escalating withdrawal symptoms including persistent tachycardia and severe hypertension, pronounced agitation and tremulousness.  Are worried he may have a seizure or florid DTs.  Will start an IV, give IV sedatives and escalating doses and plan to admit to hospitalist.   [PS]    Clinical Course User Index [PS] Carrie Mew, MD    ----------------------------------------- 8:05 PM on 09/21/2019 ----------------------------------------- Will need to start with 4 mg intramuscular Ativan to control tremors and agitation and allow for IV start.  Final diagnoses:  Alcohol abuse  Alcoholic  intoxication with complication ([place])  Alcohol withdrawal syndrome with complication [facility])       Carrie Mew, MD 09/21/19 2006

## 2019-09-21 NOTE — ED Notes (Signed)
Pt. Yelling out in hallway bed.  Pt. Sweating and asking to stand.  Pt. Assisted in bed to make comfortable.  MD made aware, orders issued.

## 2019-09-22 ENCOUNTER — Inpatient Hospital Stay: Payer: Medicare HMO

## 2019-09-22 LAB — URINALYSIS, ROUTINE W REFLEX MICROSCOPIC
Bilirubin Urine: NEGATIVE
Glucose, UA: NEGATIVE mg/dL
Hgb urine dipstick: NEGATIVE
Ketones, ur: NEGATIVE mg/dL
Leukocytes,Ua: NEGATIVE
Nitrite: NEGATIVE
Protein, ur: NEGATIVE mg/dL
Specific Gravity, Urine: 1.01 (ref 1.005–1.030)
pH: 7 (ref 5.0–8.0)

## 2019-09-22 LAB — PHOSPHORUS: Phosphorus: 3.9 mg/dL (ref 2.5–4.6)

## 2019-09-22 LAB — GLUCOSE, CAPILLARY
Glucose-Capillary: 137 mg/dL — ABNORMAL HIGH (ref 70–99)
Glucose-Capillary: 77 mg/dL (ref 70–99)

## 2019-09-22 LAB — MRSA PCR SCREENING: MRSA by PCR: NEGATIVE

## 2019-09-22 MED ORDER — ENOXAPARIN SODIUM 40 MG/0.4ML ~~LOC~~ SOLN
40.0000 mg | SUBCUTANEOUS | Status: DC
  Administered 2019-09-22 – 2019-09-23 (×2): 40 mg via SUBCUTANEOUS
  Filled 2019-09-22 (×2): qty 0.4

## 2019-09-22 MED ORDER — CHLORHEXIDINE GLUCONATE CLOTH 2 % EX PADS
6.0000 | MEDICATED_PAD | Freq: Every day | CUTANEOUS | Status: DC
  Administered 2019-09-22 – 2019-09-27 (×2): 6 via TOPICAL

## 2019-09-22 MED ORDER — DILTIAZEM HCL ER COATED BEADS 120 MG PO CP24
120.0000 mg | ORAL_CAPSULE | Freq: Every day | ORAL | Status: DC
  Filled 2019-09-22 (×2): qty 1

## 2019-09-22 NOTE — Progress Notes (Addendum)
Quiet day. Remained calm and arousable all day. Pulled off condom cath late this afternoon. Bath and bed change done. Skin with  stage 1 breakdown on heels and bottom of feet bilaterally as well as rectal area. Old bruising noted on right side of body especially in shoulder area. Urine output low but no intake except for medications. B/P borderline low. Unable or unwilling to carry on a conversation. Bilateral arm tremors noted. More severe when awake.
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
## 2019-09-22 NOTE — Progress Notes (Signed)
[facility] Progress Note  Patient: Douglas Peterson. SFK:[id number]   PCP: [name], MD DOB: 1951-03-29   DOA: 09/20/2019   DOS: 09/22/2019   Date of Service: the patient was seen and examined on 09/22/2019  Chief Complaint  Patient presents with  . Alcohol Intoxication     Brief hospital course: Pt. with PMH of alcohol abuse; presented with complain of confusion, was found to have alcohol induced delirium.  Currently further plan is close monitoring for withdrawals.  Subjective: Patient remained sleepy and drowsy.  Denies any acute complaint.  No nausea no vomiting.  Assessment and Plan: Scheduled Meds: . aspirin EC  81 mg Oral Daily  . baclofen  10 mg Oral TID  . cefTRIAXone (ROCEPHIN) IM  1 g Intramuscular Q24H  . Chlorhexidine Gluconate Cloth  6 each Topical Q0600  . enoxaparin (LOVENOX) injection  40 mg Subcutaneous Q24H  . feeding supplement (ENSURE ENLIVE)  237 mL Oral BID BM  . folic acid  174 mcg Oral Daily  . LORazepam  1 mg Oral Q4H   Followed by  . [START ON 09/23/2019] LORazepam  0-4 mg Oral Q8H  . multivitamin with minerals  1 tablet Oral Daily  . PARoxetine  30 mg Oral Daily  . sodium chloride flush  3 mL Intravenous Q12H  . thiamine  100 mg Oral Daily  . vitamin C  1,000 mg Oral Daily   Continuous Infusions: . sodium chloride     PRN Meds: sodium chloride, doxepin, hydrOXYzine, LORazepam **OR** LORazepam, sodium chloride flush  1. alcohol withdrawal with delirium and acute hyper activity -patient has been in the emergency department for over 24 hours.  Call withdrawal protocol has been in place.  Patient remained very agitated for many hours.  He did receive additional doses of lorazepam and Librium.  At the time of examination the patient is quite somnolent.  Difficult to arouse.  He has a history of alcohol abuse with multiple hospitalizations.  He has been seen as an outpatient for counseling but he has been refractory. Plan       Alcohol withdrawal protocol             Transfer to MedSurg  2.  Hypertension -patient is on beta-blocker as an outpatient.  During his ED stay patient has had fluctuating blood pressures oftentimes running very high.  It is thought to be due to his alcohol withdrawal Blood pressure slightly soft right now.  Plan      hold his outpatient medications  3.  Leukocytosis -patient is afebrile.  Examination is not revealing. Plan     chest x-ray unremarkable             Urinalysis unremarkable             Coverage with ceftriaxone 2 g IV, additional antibiotics to be determined based on results of above.  Diet: Cardiac diet  DVT Prophylaxis: Subcutaneous Lovenox   Advance goals of care discussion: Full code  Family Communication: no family was present at bedside, at the time of interview.   Disposition:  Discharge to home.  Consultants: none Procedures: none  Antibiotics: Anti-infectives (From admission, onward)   Start     Dose/Rate Route Frequency Ordered Stop   09/22/19 0000  cefTRIAXone (ROCEPHIN) injection 1 g     1 g Intramuscular Every 24 hours 09/21/19 2354         Objective: Physical Exam: Vitals:   09/22/19 1400 09/22/19 1500 09/22/19 1600 09/22/19 1800  BP: 109/71 105/75 117/73 116/78  Pulse: 79 79 77 72  Resp: (!) 25 (!) 24 (!) 22 20  Temp:   98.2 F (36.8 C)   TempSrc:      SpO2: 93% 94% 95% 95%  Weight:      Height:        Intake/Output Summary (Last 24 hours) at 09/22/2019 1914 Last data filed at 09/22/2019 1750 Gross per 24 hour  Intake -  Output 150 ml  Net -150 ml   Filed Weights   09/20/19 1756 09/20/19 1803  Weight: 58.1 kg 53.5 kg   General: alert and oriented to time, place, and person. Appear in mild distress, affect appropriate Eyes: PERRL, Conjunctiva normal ENT: Oral Mucosa Clear, moist  Neck: no JVD, no Abnormal Mass Or lumps Cardiovascular: S1 and S2 Present, no Murmur,  Respiratory: good respiratory effort, Bilateral Air entry  equal and Decreased, no signs of accessory muscle use, Clear to Auscultation, no Crackles, no wheezes Abdomen: Bowel Sound present, Soft and no tenderness, no hernia Skin: no rashes  Extremities: no Pedal edema, no calf tenderness Neurologic: without any new focal findings Gait not checked due to patient safety concerns  Data Reviewed: I have personally reviewed and interpreted daily labs, tele strips, imagings as discussed above. I reviewed all nursing notes, pharmacy notes, vitals, pertinent old records I have discussed plan of care as described above with RN and patient/family.  CBC: Recent Labs  Lab 09/20/19 1813  WBC 12.1*  HGB 16.4  HCT 47.5  MCV 92.4  PLT 174*   Basic Metabolic Panel: Recent Labs  Lab 09/20/19 1813 09/22/19 0419  NA 134*  --   K 4.1  --   CL 90*  --   CO2 27  --   GLUCOSE 103*  --   BUN 7*  --   CREATININE 0.43*  --   CALCIUM 9.1  --   PHOS  --  3.9    Liver Function Tests: Recent Labs  Lab 09/20/19 1813  AST 60*  ALT 41  ALKPHOS 128*  BILITOT 1.0  PROT 8.7*  ALBUMIN 4.8   No results for input(s): LIPASE, AMYLASE in the last 168 hours. No results for input(s): AMMONIA in the last 168 hours. Coagulation Profile: No results for input(s): INR, PROTIME in the last 168 hours. Cardiac Enzymes: No results for input(s): CKTOTAL, CKMB, CKMBINDEX, TROPONINI in the last 168 hours. BNP (last 3 results) No results for input(s): PROBNP in the last 8760 hours. CBG: Recent Labs  Lab 09/22/19 0351  GLUCAP 137*   Studies: Dg Chest Port 1 View  Result Date: 09/22/2019 CLINICAL DATA:  Leukocytosis EXAM: PORTABLE CHEST 1 VIEW COMPARISON:  10/12/2018 FINDINGS: The heart size and mediastinal contours are within normal limits. Low volume examination without acute abnormality. The visualized skeletal structures are unremarkable. IMPRESSION: Low volume AP portable examination without acute abnormality of the lungs. Electronically Signed   By: Eddie Candle M.D.   On: 09/22/2019 08:28     Time spent: 35 minutes  Author: Berle Mull, MD [facility] 09/22/2019 7:14 PM  To reach On-call, see care teams to locate the attending and reach out to them via [email or web address]. If 7PM-7AM, please contact night-coverage If you still have difficulty reaching the attending provider, please page the [facility] (Director on Call) for [facility] on amion for assistance.

## 2019-09-23 ENCOUNTER — Inpatient Hospital Stay: Payer: Medicare HMO

## 2019-09-23 LAB — COMPREHENSIVE METABOLIC PANEL
ALT: 78 U/L — ABNORMAL HIGH (ref 0–44)
AST: 131 U/L — ABNORMAL HIGH (ref 15–41)
Albumin: 4.3 g/dL (ref 3.5–5.0)
Alkaline Phosphatase: 84 U/L (ref 38–126)
Anion gap: 16 — ABNORMAL HIGH (ref 5–15)
BUN: 19 mg/dL (ref 8–23)
CO2: 26 mmol/L (ref 22–32)
Calcium: 8.9 mg/dL (ref 8.9–10.3)
Chloride: 101 mmol/L (ref 98–111)
Creatinine, Ser: 0.72 mg/dL (ref 0.61–1.24)
GFR calc Af Amer: >60 mL/min (ref >60)
GFR calc non Af Amer: >60 mL/min (ref >60)
Glucose, Bld: 89 mg/dL (ref 70–99)
Potassium: 2.9 mmol/L — ABNORMAL LOW (ref 3.5–5.1)
Sodium: 143 mmol/L (ref 135–145)
Total Bilirubin: 1.3 mg/dL — ABNORMAL HIGH (ref 0.3–1.2)
Total Protein: 7.8 g/dL (ref 6.5–8.1)

## 2019-09-23 LAB — CBC WITH DIFFERENTIAL/PLATELET
Abs Immature Granulocytes: 0.01 10*3/uL (ref 0.00–0.07)
Basophils Absolute: 0 10*3/uL (ref 0.0–0.1)
Basophils Relative: 0 %
Eosinophils Absolute: 0 10*3/uL (ref 0.0–0.5)
Eosinophils Relative: 0 %
HCT: 40.7 % (ref 39.0–52.0)
Hemoglobin: 14.4 g/dL (ref 13.0–17.0)
Immature Granulocytes: 0 %
Lymphocytes Relative: 34 %
Lymphs Abs: 2.3 10*3/uL (ref 0.7–4.0)
MCH: 32.2 pg (ref 26.0–34.0)
MCHC: 35.4 g/dL (ref 30.0–36.0)
MCV: 91.1 fL (ref 80.0–100.0)
Monocytes Absolute: 0.5 10*3/uL (ref 0.1–1.0)
Monocytes Relative: 7 %
Neutro Abs: 3.9 10*3/uL (ref 1.7–7.7)
Neutrophils Relative %: 59 %
Platelets: 75 10*3/uL — ABNORMAL LOW (ref 150–400)
RBC: 4.47 MIL/uL (ref 4.22–5.81)
RDW: 12.3 % (ref 11.5–15.5)
WBC: 6.8 10*3/uL (ref 4.0–10.5)
nRBC: 0 % (ref 0.0–0.2)

## 2019-09-23 LAB — BASIC METABOLIC PANEL
Anion gap: 11 (ref 5–15)
BUN: 19 mg/dL (ref 8–23)
CO2: 27 mmol/L (ref 22–32)
Calcium: 8.9 mg/dL (ref 8.9–10.3)
Chloride: 104 mmol/L (ref 98–111)
Creatinine, Ser: 0.61 mg/dL (ref 0.61–1.24)
GFR calc Af Amer: >60 mL/min (ref >60)
GFR calc non Af Amer: >60 mL/min (ref >60)
Glucose, Bld: 111 mg/dL — ABNORMAL HIGH (ref 70–99)
Potassium: 3.2 mmol/L — ABNORMAL LOW (ref 3.5–5.1)
Sodium: 142 mmol/L (ref 135–145)

## 2019-09-23 LAB — MAGNESIUM
Magnesium: 2.3 mg/dL (ref 1.7–2.4)
Magnesium: 2.3 mg/dL (ref 1.7–2.4)

## 2019-09-23 LAB — GLUCOSE, CAPILLARY
Glucose-Capillary: 80 mg/dL (ref 70–99)
Glucose-Capillary: 80 mg/dL (ref 70–99)

## 2019-09-23 MED ORDER — VITAMIN B-1 100 MG PO TABS
100.0000 mg | ORAL_TABLET | Freq: Every day | ORAL | Status: DC
  Administered 2019-09-24: 100 mg via ORAL
  Filled 2019-09-23: qty 1

## 2019-09-23 MED ORDER — LORAZEPAM 2 MG/ML IJ SOLN
0.0000 mg | Freq: Two times a day (BID) | INTRAMUSCULAR | Status: DC
  Administered 2019-09-26 – 2019-09-27 (×2): 2 mg via INTRAVENOUS
  Filled 2019-09-23 (×2): qty 1

## 2019-09-23 MED ORDER — DEXTROSE IN LACTATED RINGERS 5 % IV SOLN
INTRAVENOUS | Status: DC
  Administered 2019-09-23: 1000 mL via INTRAVENOUS
  Administered 2019-09-24 – 2019-09-26 (×3): via INTRAVENOUS

## 2019-09-23 MED ORDER — POTASSIUM CHLORIDE CRYS ER 20 MEQ PO TBCR
40.0000 meq | EXTENDED_RELEASE_TABLET | Freq: Once | ORAL | Status: AC
  Administered 2019-09-23: 40 meq via ORAL
  Filled 2019-09-23: qty 2

## 2019-09-23 MED ORDER — POTASSIUM CHLORIDE 10 MEQ/100ML IV SOLN
10.0000 meq | INTRAVENOUS | Status: AC
  Administered 2019-09-23 (×4): 10 meq via INTRAVENOUS
  Filled 2019-09-23 (×4): qty 100

## 2019-09-23 MED ORDER — ADULT MULTIVITAMIN W/MINERALS CH
1.0000 | ORAL_TABLET | Freq: Every day | ORAL | Status: DC
  Administered 2019-09-24 – 2019-10-03 (×10): 1 via ORAL
  Filled 2019-09-23 (×10): qty 1

## 2019-09-23 MED ORDER — LORAZEPAM 2 MG/ML IJ SOLN
0.0000 mg | Freq: Four times a day (QID) | INTRAMUSCULAR | Status: DC
  Administered 2019-09-24 – 2019-09-25 (×3): 1 mg via INTRAVENOUS
  Administered 2019-09-25: 14:00:00 3 mg via INTRAVENOUS
  Administered 2019-09-25: 18:00:00 2 mg via INTRAVENOUS
  Filled 2019-09-23 (×3): qty 1
  Filled 2019-09-23: qty 2
  Filled 2019-09-23: qty 1

## 2019-09-23 MED ORDER — THIAMINE HCL 100 MG/ML IJ SOLN
500.0000 mg | Freq: Once | INTRAVENOUS | Status: AC
  Administered 2019-09-23: 500 mg via INTRAVENOUS
  Filled 2019-09-23: qty 5
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
## 2019-09-23 NOTE — Progress Notes (Signed)
Down to xray for Barium swallow.

## 2019-09-23 NOTE — Progress Notes (Signed)
[facility] Progress Note  Patient: Douglas Peterson. [id number]:[id number]   PCP: [name], MD DOB: 1950-11-08   DOA: 09/20/2019   DOS: 09/23/2019   Date of Service: the patient was seen and examined on 09/23/2019  Chief Complaint  Patient presents with  . Alcohol Intoxication   Brief hospital course: Pt. with PMH of alcohol abuse; presented with complain of confusion, was found to have alcohol induced delirium.  Currently further plan is close monitoring for withdrawals.  Subjective: Patient remained sleepy and drowsy. More awake today then yesterday Denies any acute complaint.  No nausea no vomiting.  Assessment and Plan: Scheduled Meds: . aspirin EC  81 mg Oral Daily  . cefTRIAXone (ROCEPHIN) IM  1 g Intramuscular Q24H  . Chlorhexidine Gluconate Cloth  6 each Topical Q0600  . enoxaparin (LOVENOX) injection  40 mg Subcutaneous Q24H  . feeding supplement (ENSURE ENLIVE)  237 mL Oral BID BM  . LORazepam  1 mg Oral Q4H   Followed by  . LORazepam  0-4 mg Oral Q8H  . multivitamin with minerals  1 tablet Oral Daily  . PARoxetine  30 mg Oral Daily  . sodium chloride flush  3 mL Intravenous Q12H  . [START ON 09/24/2019] thiamine  100 mg Oral Daily  . vitamin C  1,000 mg Oral Daily   Continuous Infusions: . sodium chloride    . dextrose 5% lactated ringers 1,000 mL (09/23/19 0914)  . thiamine injection     PRN Meds: sodium chloride, hydrOXYzine, LORazepam **OR** LORazepam, sodium chloride flush  1. alcohol withdrawal with delirium and acute hyper activity -patient has been in the emergency department for over 24 hours.  Call withdrawal protocol has been in place.  Patient remained very agitated for many hours.  He did receive additional doses of lorazepam and Librium.  At the time of examination the patient is quite somnolent.  Difficult to arouse.  He has a history of alcohol abuse with multiple hospitalizations.  He has been seen as an outpatient for counseling but he has been  refractory. Plan                  Alcohol withdrawal protocol             Transfer to MedSurg  2.  Hypertension -patient is on beta-blocker as an outpatient.  During his ED stay patient has had fluctuating blood pressures oftentimes running very high.  It is thought to be due to his alcohol withdrawal Blood pressure slightly soft right now.  Plan      hold his outpatient medications  3.  Leukocytosis -patient is afebrile.  Examination is not revealing. Plan     chest x-ray unremarkable             Urinalysis unremarkable             Coverage with ceftriaxone 2 g IV, additional antibiotics to be determined based on results cultures.  4. Dehydration  Start IV fluids  5. Hypokalemia  Add KCL  6. concern for dysphagia  Speech therapy consult  Diet: NPO diet pending speech consult DVT Prophylaxis: Subcutaneous Lovenox   Advance goals of care discussion: Full code  Family Communication: no family was present at bedside, at the time of interview.   Disposition:  Discharge to home.  Consultants: none Procedures: none  Antibiotics: Anti-infectives (From admission, onward)   Start     Dose/Rate Route Frequency Ordered Stop   09/22/19 0000  cefTRIAXone (ROCEPHIN) injection 1  g     1 g Intramuscular Every 24 hours 09/21/19 2354         Objective: Physical Exam: Vitals:   09/23/19 0500 09/23/19 0600 09/23/19 0800 09/23/19 1000  BP:  (!) 143/94 (!) 143/89 (!) 153/90  Pulse: 89 83 81 99  Resp: (!) 24 (!) 24 (!) 26 19  Temp:   98.1 F (36.7 C)   TempSrc:      SpO2: 93% 95% 95% 95%  Weight:      Height:        Intake/Output Summary (Last 24 hours) at 09/23/2019 1246 Last data filed at 09/23/2019 1100 Gross per 24 hour  Intake 400 ml  Output 150 ml  Net 250 ml   Filed Weights   09/20/19 1756 09/20/19 1803  Weight: 58.1 kg 53.5 kg   General: alert and oriented to time, place, and person. Appear in mild distress, affect appropriate Eyes: PERRL, Conjunctiva normal  ENT: Oral Mucosa Clear, moist  Neck: no JVD, no Abnormal Mass Or lumps Cardiovascular: S1 and S2 Present, no Murmur,  Respiratory: good respiratory effort, Bilateral Air entry equal and Decreased, no signs of accessory muscle use, Clear to Auscultation, no Crackles, no wheezes Abdomen: Bowel Sound present, Soft and no tenderness, no hernia Skin: no rashes  Extremities: no Pedal edema, no calf tenderness Neurologic: without any new focal findings Gait not checked due to patient safety concerns  Data Reviewed: I have personally reviewed and interpreted daily labs, tele strips, imagings as discussed above. I reviewed all nursing notes, pharmacy notes, vitals, pertinent old records I have discussed plan of care as described above with RN and patient/family.  CBC: Recent Labs  Lab 09/20/19 1813 09/23/19 0520  WBC 12.1* 6.8  NEUTROABS  --  3.9  HGB 16.4 14.4  HCT 47.5 40.7  MCV 92.4 91.1  PLT 144* 75*   Basic Metabolic Panel: Recent Labs  Lab 09/20/19 1813 09/22/19 0419 09/23/19 0520  NA 134*  --  143  K 4.1  --  2.9*  CL 90*  --  101  CO2 27  --  26  GLUCOSE 103*  --  89  BUN 7*  --  19  CREATININE 0.43*  --  0.72  CALCIUM 9.1  --  8.9  MG  --   --  2.3  PHOS  --  3.9  --     Liver Function Tests: Recent Labs  Lab 09/20/19 1813 09/23/19 0520  AST 60* 131*  ALT 41 78*  ALKPHOS 128* 84  BILITOT 1.0 1.3*  PROT 8.7* 7.8  ALBUMIN 4.8 4.3   No results for input(s): LIPASE, AMYLASE in the last 168 hours. No results for input(s): AMMONIA in the last 168 hours. Coagulation Profile: No results for input(s): INR, PROTIME in the last 168 hours. Cardiac Enzymes: No results for input(s): CKTOTAL, CKMB, CKMBINDEX, TROPONINI in the last 168 hours. BNP (last 3 results) No results for input(s): PROBNP in the last 8760 hours. CBG: Recent Labs  Lab 09/22/19 0351 09/22/19 2112 09/23/19 0735 09/23/19 1121  GLUCAP 137* 77 80 80   Studies: No results found.   Time  spent: 35 minutes  Author: Berle Mull, MD [facility] 09/23/2019 12:46 PM  To reach On-call, see care teams to locate the attending and reach out to them via [email or web address]. If 7PM-7AM, please contact night-coverage If you still have difficulty reaching the attending provider, please page the [facility] (Director on Call) for [facility] on amion for assistance.

## 2019-09-24 LAB — COMPREHENSIVE METABOLIC PANEL
ALT: 83 U/L — ABNORMAL HIGH (ref 0–44)
AST: 103 U/L — ABNORMAL HIGH (ref 15–41)
Albumin: 4 g/dL (ref 3.5–5.0)
Alkaline Phosphatase: 115 U/L (ref 38–126)
Anion gap: 10 (ref 5–15)
BUN: 19 mg/dL (ref 8–23)
CO2: 25 mmol/L (ref 22–32)
Calcium: 9.6 mg/dL (ref 8.9–10.3)
Chloride: 108 mmol/L (ref 98–111)
Creatinine, Ser: 0.54 mg/dL — ABNORMAL LOW (ref 0.61–1.24)
GFR calc Af Amer: >60 mL/min (ref >60)
GFR calc non Af Amer: >60 mL/min (ref >60)
Glucose, Bld: 150 mg/dL — ABNORMAL HIGH (ref 70–99)
Potassium: 3.7 mmol/L (ref 3.5–5.1)
Sodium: 143 mmol/L (ref 135–145)
Total Bilirubin: 1.1 mg/dL (ref 0.3–1.2)
Total Protein: 7.2 g/dL (ref 6.5–8.1)

## 2019-09-24 LAB — CBC
HCT: 39.6 % (ref 39.0–52.0)
Hemoglobin: 13.7 g/dL (ref 13.0–17.0)
MCH: 32.8 pg (ref 26.0–34.0)
MCHC: 34.6 g/dL (ref 30.0–36.0)
MCV: 94.7 fL (ref 80.0–100.0)
Platelets: 86 10*3/uL — ABNORMAL LOW (ref 150–400)
RBC: 4.18 MIL/uL — ABNORMAL LOW (ref 4.22–5.81)
RDW: 12 % (ref 11.5–15.5)
WBC: 6.6 10*3/uL (ref 4.0–10.5)
nRBC: 0 % (ref 0.0–0.2)

## 2019-09-24 LAB — PHOSPHORUS: Phosphorus: 2.6 mg/dL (ref 2.5–4.6)

## 2019-09-24 LAB — MAGNESIUM: Magnesium: 2 mg/dL (ref 1.7–2.4)

## 2019-09-24 MED ORDER — THIAMINE HCL 100 MG/ML IJ SOLN
500.0000 mg | Freq: Every day | INTRAVENOUS | Status: AC
  Administered 2019-09-24 – 2019-09-25 (×2): 500 mg via INTRAVENOUS
  Filled 2019-09-24 (×2): qty 5

## 2019-09-24 NOTE — Progress Notes (Signed)
PT Cancellation Note  Patient Details Name: Douglas Peterson. MRN: [id number] DOB: 1951-09-27   Cancelled Treatment:    Reason Eval/Treat Not Completed: Fatigue/lethargy limiting ability to participate(Patient consult received and reviewed. Patient sleeping upon PT attempt, unable to rouse. Will attempt again at later time/date as available/ medically appropriate.)  Janna Arch, PT, DPT   09/24/2019, 4:34 PM

## 2019-09-24 NOTE — Progress Notes (Addendum)
[facility] Progress Note  Patient: Douglas Peterson. WJX:[id number]   PCP: [name], MD DOB: 1951/06/14   DOA: 09/20/2019   DOS: 09/24/2019   Date of Service: the patient was seen and examined on 09/24/2019  Chief Complaint  Patient presents with  . Alcohol Intoxication   Brief hospital course: Pt. with PMH of alcohol abuse; presented with complain of confusion, was found to have alcohol induced delirium.  Currently further plan is treat suspected b1 deficiency.   Subjective: Patient awake and alert, still fatigued. Somewhat confused, tells me that his cousins are next door and he wants to see them.  No acute pain no events overnight  Assessment and Plan: Scheduled Meds: . aspirin EC  81 mg Oral Daily  . Chlorhexidine Gluconate Cloth  6 each Topical Q0600  . feeding supplement (ENSURE ENLIVE)  237 mL Oral BID BM  . LORazepam  0-4 mg Intravenous Q6H   Followed by  . [START ON 09/26/2019] LORazepam  0-4 mg Intravenous Q12H  . multivitamin with minerals  1 tablet Oral Daily  . PARoxetine  30 mg Oral Daily  . vitamin C  1,000 mg Oral Daily   Continuous Infusions: . dextrose 5% lactated ringers 50 mL/hr at 09/24/19 1037  . thiamine injection 500 mg (09/24/19 1441)   PRN Meds: hydrOXYzine  1. alcohol withdrawal with delirium and acute hyper activity Patient remained very agitated for many hours.   history of alcohol abuse with multiple hospitalizations.   He has been seen as an outpatient for counseling but he has been refractory. Continue CIWA protocol and monitor  2. 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  Will check thyroid  2.  Hypertension - patient is on beta-blocker as an outpatient. hold his outpatient medications  3.  Leukocytosis - patient is afebrile.   Examination is not revealing. chest x-ray unremarkable Urinalysis  unremarkable Initially on ceftriaxone 2 g IV, will stop now  4. Dehydration  Poor PO intake Dysphagia  On IV fluids Speech consult suggest dysphagia 1 diet  5. Hypokalemia  Replaced, monitor   6. Chronic thrombocytopenia Platelets are low in 80s.  No active bleeding Monitor Likely due to alcoholism  Diet: Dysphagia 1 (Puree) solids;Honey thick liquids DVT Prophylaxis: SCD   Advance goals of care discussion: Full code  Family Communication: no family was present at bedside, at the time of interview.   Disposition:  Discharge to home.  Consultants: none Procedures: none  Antibiotics: Anti-infectives (From admission, onward)   Start     Dose/Rate Route Frequency Ordered Stop   09/22/19 0000  cefTRIAXone (ROCEPHIN) injection 1 g  Status:  Discontinued     1 g Intramuscular Every 24 hours 09/21/19 2354 09/23/19 1400       Objective: Physical Exam: Vitals:   09/23/19 2100 09/23/19 2244 09/24/19 0500 09/24/19 1021  BP:  (!) 159/96  139/87  Pulse: 86 85  84  Resp: (!) 29 18  16   Temp:  98.4 F (36.9 C)  97.7 F (36.5 C)  TempSrc:  Oral  Oral  SpO2: 96% 98%  98%  Weight:   49.9 kg   Height:   5' 4"  (1.626 m)     Intake/Output Summary (Last 24 hours) at 09/24/2019 1605 Last data filed at 09/24/2019 1500 Gross per 24 hour  Intake 1343.01 ml  Output -  Net 1343.01 ml  Filed Weights   09/20/19 1756 09/20/19 1803 09/24/19 0500  Weight: 58.1 kg 53.5 kg 49.9 kg   General: alert and oriented to place and person. Appear in mild distress, affect appropriate Eyes: PERRL, Conjunctiva normal ENT: Oral Mucosa Clear, moist  Neck: no JVD, no Abnormal Mass Or lumps Cardiovascular: S1 and S2 Present, no Murmur,  Respiratory: good respiratory effort, Bilateral Air entry equal and Decreased, no signs of accessory muscle use, Clear to Auscultation, no Crackles, no wheezes Abdomen: Bowel Sound present, Soft and no tenderness, no hernia Skin: no rashes, multiple scabbed  injuries Extremities: no Pedal edema, no calf tenderness Neurologic: without any new focal findings Gait not checked due to patient safety concerns  Data Reviewed: I have personally reviewed and interpreted daily labs, tele strips, imagings as discussed above. I reviewed all nursing notes, pharmacy notes, vitals, pertinent old records I have discussed plan of care as described above with RN and patient/family.  CBC: Recent Labs  Lab 09/20/19 1813 09/23/19 0520 09/24/19 0155  WBC 12.1* 6.8 6.6  NEUTROABS  --  3.9  --   HGB 16.4 14.4 13.7  HCT 47.5 40.7 39.6  MCV 92.4 91.1 94.7  PLT 144* 75* 86*   Basic Metabolic Panel: Recent Labs  Lab 09/20/19 1813 09/22/19 0419 09/23/19 0520 09/23/19 1452 09/24/19 0155  NA 134*  --  143 142 143  K 4.1  --  2.9* 3.2* 3.7  CL 90*  --  101 104 108  CO2 27  --  26 27 25   GLUCOSE 103*  --  89 111* 150*  BUN 7*  --  19 19 19   CREATININE 0.43*  --  0.72 0.61 0.54*  CALCIUM 9.1  --  8.9 8.9 9.6  MG  --   --  2.3 2.3 2.0  PHOS  --  3.9  --   --  2.6    Liver Function Tests: Recent Labs  Lab 09/20/19 1813 09/23/19 0520 09/24/19 0155  AST 60* 131* 103*  ALT 41 78* 83*  ALKPHOS 128* 84 115  BILITOT 1.0 1.3* 1.1  PROT 8.7* 7.8 7.2  ALBUMIN 4.8 4.3 4.0   No results for input(s): LIPASE, AMYLASE in the last 168 hours. No results for input(s): AMMONIA in the last 168 hours. Coagulation Profile: No results for input(s): INR, PROTIME in the last 168 hours. Cardiac Enzymes: No results for input(s): CKTOTAL, CKMB, CKMBINDEX, TROPONINI in the last 168 hours. BNP (last 3 results) No results for input(s): PROBNP in the last 8760 hours. CBG: Recent Labs  Lab 09/22/19 0351 09/22/19 2112 09/23/19 0735 09/23/19 1121  GLUCAP 137* 77 80 80   Studies: No results found.   Time spent: 35 minutes  Author: Berle Mull, MD [facility] 09/24/2019 4:05 PM  To reach On-call, see care teams to locate the attending and reach out to  them via [email or web address]. If 7PM-7AM, please contact night-coverage If you still have difficulty reaching the attending provider, please page the [facility] (Director on Call) for [facility] on amion for assistance.

## 2019-09-25 NOTE — Evaluation (Signed)
Physical Therapy Evaluation Patient Details Name: Douglas Peterson. MRN: [id number] DOB: October 27, 1951 Today's Date: 09/25/2019   History of Present Illness  68 yo male presented to ED with c/o of difficulty walking and requesting help with detox, pt had been drinking to treat his nausea, stated he has had several falls and has difficulty walking due to past hx of TBI with residual R sided weakness, PMH includes alcohol abuse, HTN, hyponatremia  Clinical Impression  Pt is a 68 yo male admitted for above. Pt in bed upon arrival and agreeable to PT. Pt fatigued throughout session suspect from withdrawal. Pt reported living alone with siblings nearby that can provide assistance and that he is independent with all ADLs/IADLs. Pt disoriented to time, place and situation throughout session and generally seemed confused. Per chart review, pt poor historian reporting different home set up information and number of falls this session as compared to previous encounters. Pt very shaky throughout session suspect due to withdrawal. Pt presents with decreased strength (R >L secondary to residual right sided weakness from old TBI), balance, cognition, safety awareness and activity tolerance. Pt required mod A for transfers with heavy posterior lean that pt is unable correct despite cuing. Pt would benefit from skilled acute PT to improve deficits to increase independence and decrease fall risk. Recommendation for STR following hospital discharge since pt currently lives alone and needs 24/7 assist.     Follow Up Recommendations SNF    Equipment Recommendations  Other (comment)(TBD)    Recommendations for Other Services       Precautions / Restrictions Precautions Precautions: Fall Restrictions Weight Bearing Restrictions: No      Mobility  Bed Mobility Overal bed mobility: Modified Independent             General bed mobility comments: pt able to get EOB with increased time and use of bedrails,  appeared difficult but pt requested no assistance  Transfers Overall transfer level: Needs assistance Equipment used: Rolling walker (2 wheeled) Transfers: Sit to/from Stand Sit to Stand: Mod assist         General transfer comment: mod A to rise with RW, pt with strong posterior lean and unable to correct, cuing for hand placement, x2 trials from bed  Ambulation/Gait             General Gait Details: shuffle steps to recliner with RW requiring mod A secondary to strong posterior lean, assist also for RW mgt, pt using more sliding type steps over actually picking up foot to take appreciable step  Stairs            Wheelchair Mobility    Modified Rankin (Stroke Patients Only)       Balance Overall balance assessment: Needs assistance;History of Falls Sitting-balance support: Feet supported;Single extremity supported Sitting balance-Leahy Scale: Fair   Postural control: Posterior lean Standing balance support: Bilateral upper extremity supported Standing balance-Leahy Scale: Poor Standing balance comment: mod A to maintain standing balance with RW, strong posterior lean                             Pertinent Vitals/Pain Pain Assessment: No/denies pain    Home Living Family/patient expects to be discharged to:: Private residence Living Arrangements: Alone Available Help at Discharge: Family;Available PRN/intermittently(brother and sister live nearby) Type of Home: House Home Access: Stairs to enter Entrance Stairs-Rails: Can reach both(previous encounter lists RHR) Entrance Stairs-Number of Steps: 5 in front,  3  in back, per chart review from previous encounter 2 Home Layout: One level Home Equipment: Hood - 2 wheels;Cane - single point;Shower seat;Grab bars - tub/shower;Grab bars - toilet      Prior Function Level of Independence: Independent         Comments: pt reports being independent with all ADLs/IADLs however he recently stopped  mowing his 3 acres of land, normally ambulates with no AD, reported no more than 2 falls in last year "that wasnt anything significant" however per chart review from ED note pt has had several falls     Hand Dominance        Extremity/Trunk Assessment   Upper Extremity Assessment Upper Extremity Assessment: Generalized weakness;Difficult to assess due to impaired cognition, pt holds right UE in guarded position across chest, unable to spread fingers apart independently    Lower Extremity Assessment Lower Extremity Assessment: Generalized weakness;Difficult to assess due to impaired cognition, right side is weaker, left side grossly 3+/5, right side grossly 3/5       Communication   Communication: No difficulties  Cognition Arousal/Alertness: Awake/alert Behavior During Therapy: WFL for tasks assessed/performed Overall Cognitive Status: No family/caregiver present to determine baseline cognitive functioning(disoriented to time, place, situation)                                        General Comments      Exercises     Assessment/Plan    PT Assessment Patient needs continued PT services  PT Problem List Decreased strength;Decreased mobility;Decreased safety awareness;Decreased range of motion;Decreased activity tolerance;Decreased balance;Decreased knowledge of use of DME       PT Treatment Interventions DME instruction;Therapeutic exercise;Gait training;Balance training;Stair training;Functional mobility training;Therapeutic activities;Patient/family education;Cognitive remediation    PT Goals (Current goals can be found in the Care Plan section)  Acute Rehab PT Goals Patient Stated Goal: go home PT Goal Formulation: With patient Time For Goal Achievement: 10/09/19 Potential to Achieve Goals: Fair    Frequency Min 2X/week   Barriers to discharge        Co-evaluation               AM-PAC PT "6 Clicks" Mobility  Outcome Measure Help needed  turning from your back to your side while in a flat bed without using bedrails?: None Help needed moving from lying on your back to sitting on the side of a flat bed without using bedrails?: A Little Help needed moving to and from a bed to a chair (including a wheelchair)?: A Lot Help needed standing up from a chair using your arms (e.g., wheelchair or bedside chair)?: A Lot Help needed to walk in hospital room?: A Lot Help needed climbing 3-5 steps with a railing? : Total 6 Click Score: 14    End of Session Equipment Utilized During Treatment: Gait belt Activity Tolerance: Patient tolerated treatment well;Patient limited by fatigue Patient left: in chair;with call bell/phone within reach;with chair alarm set Nurse Communication: Mobility status PT Visit Diagnosis: Muscle weakness (generalized) (M62.81);Difficulty in walking, not elsewhere classified (R26.2);Unsteadiness on feet (R26.81)    Time: 2355-7322 PT Time Calculation (min) (ACUTE ONLY): 26 min   Charges:   PT Evaluation $PT Eval Moderate Complexity: 1 Mod            PT, DPT 4:26 PM,09/25/19 [phone]    Drucilla Chalet 09/25/2019, 4:19 PM

## 2019-09-25 NOTE — Progress Notes (Signed)
[facility] Progress Note  Patient: Douglas Peterson. WJX:[id number]   PCP: [name], MD DOB: 1951-05-03   DOA: 09/20/2019   DOS: 09/25/2019   Date of Service: the patient was seen and examined on 09/25/2019  Chief Complaint  Patient presents with  . Alcohol Intoxication   Brief hospital course: Pt. with PMH of alcohol abuse; presented with complain of confusion, was found to have alcohol induced delirium.  Currently further plan is treat suspected b1 deficiency.   Subjective: Denies any acute complaint no nausea no vomiting no fever no chills.  More awake today.  Assessment and Plan: Scheduled Meds: . aspirin EC  81 mg Oral Daily  . Chlorhexidine Gluconate Cloth  6 each Topical Q0600  . feeding supplement (ENSURE ENLIVE)  237 mL Oral BID BM  . LORazepam  0-4 mg Intravenous Q6H   Followed by  . [START ON 09/26/2019] LORazepam  0-4 mg Intravenous Q12H  . multivitamin with minerals  1 tablet Oral Daily  . PARoxetine  30 mg Oral Daily  . vitamin C  1,000 mg Oral Daily   Continuous Infusions: . dextrose 5% lactated ringers 50 mL/hr at 09/25/19 1007   PRN Meds: hydrOXYzine  1. alcohol withdrawal with delirium and acute hyper activity Patient remained very agitated for many hours.   history of alcohol abuse with multiple hospitalizations.   He has been seen as an outpatient for counseling but he has been refractory. Continue CIWA protocol and monitor  2. 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  Normal thyroid  2.  Hypertension - patient is on beta-blocker as an outpatient. hold his outpatient medications  3.  Leukocytosis - patient is afebrile.   Examination is not revealing. chest x-ray unremarkable Urinalysis unremarkable Initially on ceftriaxone 2 g IV, will stop now  4. Dehydration  Poor PO intake Dysphagia  On IV fluids  Speech consult suggest dysphagia 1 diet  5. Hypokalemia  Replaced, monitor   6. Chronic thrombocytopenia Platelets are low in 80s.  No active bleeding Monitor Likely due to alcoholism  Diet: Dysphagia 1 (Puree) solids;Honey thick liquids DVT Prophylaxis: SCD   Advance goals of care discussion: Full code  Family Communication: no family was present at bedside, at the time of interview.   Disposition:  Discharge to home.  Consultants: none Procedures: none  Antibiotics: Anti-infectives (From admission, onward)   Start     Dose/Rate Route Frequency Ordered Stop   09/22/19 0000  cefTRIAXone (ROCEPHIN) injection 1 g  Status:  Discontinued     1 g Intramuscular Every 24 hours 09/21/19 2354 09/23/19 1400       Objective: Physical Exam: Vitals:   09/25/19 0101 09/25/19 0438 09/25/19 0441 09/25/19 1606  BP: (!) 176/101 (!) 160/101 (!) 156/96 (!) 149/65  Pulse: 88 79 82 93  Resp: 16   18  Temp: 98.3 F (36.8 C)   98.4 F (36.9 C)  TempSrc: Oral   Oral  SpO2: 99%   96%  Weight:      Height:        Intake/Output Summary (Last 24 hours) at 09/25/2019 1611 Last data filed at 09/25/2019 1300 Gross per 24 hour  Intake 1372.3 ml  Output 100 ml  Net 1272.3 ml   Filed Weights   09/20/19 1756 09/20/19 1803 09/24/19 0500  Weight: 58.1 kg 53.5 kg 49.9 kg   General: alert and  oriented to time, place, and person. Appear in mild distress, affect appropriate Eyes: PERRL, Conjunctiva normal ENT: Oral Mucosa Clear, moist  Neck: no JVD, no Abnormal Mass Or lumps Cardiovascular: S1 and S2 Present, no Murmur, peripheral pulses symmetrical Respiratory: good respiratory effort, Bilateral Air entry equal and Decreased, no signs of accessory muscle use, Clear to Auscultation, no Crackles, no wheezes Abdomen: Bowel Sound present, Soft and no tenderness, no hernia Skin: no rashes  Extremities: no Pedal edema, no calf tenderness Neurologic: without any new focal findings Gait not  checked due to patient safety concerns   Data Reviewed: I have personally reviewed and interpreted daily labs, tele strips, imagings as discussed above. I reviewed all nursing notes, pharmacy notes, vitals, pertinent old records I have discussed plan of care as described above with RN and patient/family.  CBC: Recent Labs  Lab 09/20/19 1813 09/23/19 0520 09/24/19 0155  WBC 12.1* 6.8 6.6  NEUTROABS  --  3.9  --   HGB 16.4 14.4 13.7  HCT 47.5 40.7 39.6  MCV 92.4 91.1 94.7  PLT 144* 75* 86*   Basic Metabolic Panel: Recent Labs  Lab 09/20/19 1813 09/22/19 0419 09/23/19 0520 09/23/19 1452 09/24/19 0155  NA 134*  --  143 142 143  K 4.1  --  2.9* 3.2* 3.7  CL 90*  --  101 104 108  CO2 27  --  26 27 25   GLUCOSE 103*  --  89 111* 150*  BUN 7*  --  19 19 19   CREATININE 0.43*  --  0.72 0.61 0.54*  CALCIUM 9.1  --  8.9 8.9 9.6  MG  --   --  2.3 2.3 2.0  PHOS  --  3.9  --   --  2.6    Liver Function Tests: Recent Labs  Lab 09/20/19 1813 09/23/19 0520 09/24/19 0155  AST 60* 131* 103*  ALT 41 78* 83*  ALKPHOS 128* 84 115  BILITOT 1.0 1.3* 1.1  PROT 8.7* 7.8 7.2  ALBUMIN 4.8 4.3 4.0   No results for input(s): LIPASE, AMYLASE in the last 168 hours. No results for input(s): AMMONIA in the last 168 hours. Coagulation Profile: No results for input(s): INR, PROTIME in the last 168 hours. Cardiac Enzymes: No results for input(s): CKTOTAL, CKMB, CKMBINDEX, TROPONINI in the last 168 hours. BNP (last 3 results) No results for input(s): PROBNP in the last 8760 hours. CBG: Recent Labs  Lab 09/22/19 0351 09/22/19 2112 09/23/19 0735 09/23/19 1121  GLUCAP 137* 77 80 80   Studies: No results found.   Time spent: 35 minutes  Author: Berle Mull, MD [facility] 09/25/2019 4:11 PM  To reach On-call, see care teams to locate the attending and reach out to them via [email or web address]. If 7PM-7AM, please contact night-coverage If you still have difficulty reaching the  attending provider, please page the [facility] (Director on Call) for [facility] on amion for assistance.

## 2019-09-26 LAB — BASIC METABOLIC PANEL
Anion gap: 9 (ref 5–15)
BUN: 13 mg/dL (ref 8–23)
CO2: 24 mmol/L (ref 22–32)
Calcium: 8.9 mg/dL (ref 8.9–10.3)
Chloride: 105 mmol/L (ref 98–111)
Creatinine, Ser: 0.49 mg/dL — ABNORMAL LOW (ref 0.61–1.24)
GFR calc Af Amer: >60 mL/min (ref >60)
GFR calc non Af Amer: >60 mL/min (ref >60)
Glucose, Bld: 94 mg/dL (ref 70–99)
Potassium: 3.7 mmol/L (ref 3.5–5.1)
Sodium: 138 mmol/L (ref 135–145)

## 2019-09-26 LAB — CBC
HCT: 36.7 % — ABNORMAL LOW (ref 39.0–52.0)
Hemoglobin: 13.1 g/dL (ref 13.0–17.0)
MCH: 32 pg (ref 26.0–34.0)
MCHC: 35.7 g/dL (ref 30.0–36.0)
MCV: 89.5 fL (ref 80.0–100.0)
Platelets: 117 10*3/uL — ABNORMAL LOW (ref 150–400)
RBC: 4.1 MIL/uL — ABNORMAL LOW (ref 4.22–5.81)
RDW: 11.9 % (ref 11.5–15.5)
WBC: 6.4 10*3/uL (ref 4.0–10.5)
nRBC: 0 % (ref 0.0–0.2)

## 2019-09-26 MED ORDER — VITAMIN B-1 100 MG PO TABS
100.0000 mg | ORAL_TABLET | Freq: Every day | ORAL | Status: DC
  Administered 2019-09-26 – 2019-10-03 (×8): 100 mg via ORAL
  Filled 2019-09-26 (×8): qty 1

## 2019-09-26 NOTE — Care Management Important Message (Signed)
Important Message  Patient Details  Name: Douglas Peterson. MRN: [id number] Date of Birth: 1951-03-11   Medicare Important Message Given:  Yes     Juliann Pulse A  09/26/2019, 11:16 AM

## 2019-09-26 NOTE — Progress Notes (Signed)
Ativan given as scheduled for 12 noon. [name] currently resting in bed with eyes closed. Very still, no tremors, no shaking, no diaphoreses.  Patient appears very comfortable.

## 2019-09-26 NOTE — TOC Initial Note (Addendum)
Transition of Care (TOC) - Initial/Assessment Note    Patient Details  Name: Douglas Peterson. MRN: [id number] Date of Birth: Oct 14, 1951  Transition of Care [facility]) CM/SW Contact:    Shelbie Hutching, RN Phone Number: 09/26/2019, 4:25 PM  Clinical Narrative:                 Patient admitted for hyponatremia related to alcoholism.  Per emergency room notes patient came in for detox.  [name] lying in bed and looks frail.  Speech is slurred and very hard to understand.  RNCM is unable to have meaningful conversation with patient at this time.   [name] from home and lives alone- he has a sister that checks in on him from time to time.  Patient also has a daughter but she does not have much to do with him any longer.  Patient has been to [facility] in the past for rehab. Patient has been open with [facility] for home health services.  [place] with [facility] reports that they will not be able to accept patient back unless he gets treatment for his alcohol addiction.  RNCM will follow up with patient tomorrow.  Patient received ativan at 1200.   Expected Discharge Plan: Skilled Nursing Facility Barriers to Discharge: Continued Medical Work up   Patient Goals and CMS Choice        Expected Discharge Plan and Services Expected Discharge Plan: [place]   Discharge Planning Services: CM Consult   Living arrangements for the past 2 months: Single Family Home                                      Prior Living Arrangements/Services Living arrangements for the past 2 months: Single Family Home Lives with:: Self Patient language and need for interpreter reviewed:: Yes Do you feel safe going back to the place where you live?: Yes      Need for Family Participation in Patient Care: Yes (Comment)(alcoholism) Care giver support system in place?: No (comment)(sister checks on him)   Criminal Activity/Legal Involvement Pertinent to Current Situation/Hospitalization: No -  Comment as needed  Activities of Daily Living      Permission Sought/Granted Permission sought to share information with : Case Manager, Other (comment) Permission granted to share information with : Yes, Verbal Permission Granted     Permission granted to share info w AGENCY: [facility]        Emotional Assessment Appearance:: Appears older than stated age Attitude/Demeanor/Rapport: Lethargic   Orientation: : Oriented to Self, Oriented to Place Alcohol / Substance Use: Alcohol Use Psych Involvement: No (comment)  Admission diagnosis:  Alcohol abuse [F10.10] Leukocytosis [D72.829] Alcohol withdrawal syndrome with complication ([place]) [G62.694] Alcoholic intoxication with complication [facility]) [W54.627] Patient Active Problem List   Diagnosis Date Noted  . Alcohol withdrawal delirium, acute, hyperactive ([place]) 09/21/2019  . Pressure injury of skin 08/15/2019  . Goals of care, counseling/discussion   . Palliative care by specialist   . Alcohol withdrawal ([place]) 03/20/2019  . Low back pain 11/15/2018  . AMS (altered mental status) 10/12/2018  . Delirium tremens ([place]) 03/08/2018  . Malnutrition of moderate degree 11/24/2017  . HTN (hypertension) 09/16/2017  . Hypothermia 12/17/2016  . Alcohol use   . Diarrhea   . Hyponatremia 07/09/2015   PCP:  [name], MD Pharmacy:   [facility]  [place] [address] Phone: [phone] Fax: [phone]     Social Determinants of Health (SDOH) Interventions    Readmission Risk Interventions No flowsheet data found.

## 2019-09-26 NOTE — Progress Notes (Signed)
[facility] Progress Note  Patient: Douglas Peterson. GLO:[id number]   PCP: [name], MD DOB: 1951/07/03   DOA: 09/20/2019   DOS: 09/26/2019   Date of Service: the patient was seen and examined on 09/26/2019  Chief Complaint  Patient presents with  . Alcohol Intoxication   Brief hospital course: Pt. with PMH of alcohol abuse; presented with complain of confusion, was found to have alcohol induced delirium.  Currently further plan is treat suspected b1 deficiency.   Subjective: Patient continues to remain fatigue and tired.  No nausea no vomiting.  No diarrhea.  Has some cough.  No abdominal pain.  Assessment and Plan: Scheduled Meds: . aspirin EC  81 mg Oral Daily  . Chlorhexidine Gluconate Cloth  6 each Topical Q0600  . feeding supplement (ENSURE ENLIVE)  237 mL Oral BID BM  . LORazepam  0-4 mg Intravenous Q12H  . multivitamin with minerals  1 tablet Oral Daily  . PARoxetine  30 mg Oral Daily  . thiamine  100 mg Oral Daily  . vitamin C  1,000 mg Oral Daily   Continuous Infusions:  PRN Meds: hydrOXYzine  1. alcohol withdrawal with delirium and acute hyper activity Patient remained very agitated for many hours.   history of alcohol abuse with multiple hospitalizations.   He has been seen as an outpatient for counseling but he has been refractory. Continue CIWA protocol and monitor  2. 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  Normal thyroid  2.  Hypertension - patient is on beta-blocker as an outpatient. hold his outpatient medications  3.  Leukocytosis - patient is afebrile.   Examination is not revealing. chest x-ray unremarkable Urinalysis unremarkable Initially on ceftriaxone 2 g IV, will stop now  4. Dehydration  Poor PO intake Dysphagia Discontinue IV fluids and monitor Speech consult suggest dysphagia 1 diet.  Concern  for dehydration on dysphagia 1 thickened diet.  5. Hypokalemia  Replaced, monitor   6. Chronic thrombocytopenia Platelets are low in 80s.  No active bleeding Monitor Likely due to alcoholism  Diet: Dysphagia 1 (Puree) solids;Honey thick liquids DVT Prophylaxis: SCD   Advance goals of care discussion: Full code  Family Communication: no family was present at bedside, at the time of interview.   Disposition:  Discharge to home.  Consultants: none Procedures: none  Antibiotics: Anti-infectives (From admission, onward)   Start     Dose/Rate Route Frequency Ordered Stop   09/22/19 0000  cefTRIAXone (ROCEPHIN) injection 1 g  Status:  Discontinued     1 g Intramuscular Every 24 hours 09/21/19 2354 09/23/19 1400       Objective: Physical Exam: Vitals:   09/25/19 2354 09/26/19 0442 09/26/19 0736 09/26/19 1604  BP: (!) 158/97 (!) 155/95 (!) 151/92 139/86  Pulse: 82 80 77 83  Resp: 18 18 18 17   Temp: 98.1 F (36.7 C) 98.2 F (36.8 C) 98 F (36.7 C) 98 F (36.7 C)  TempSrc: Oral Oral Oral Oral  SpO2: 98% 96% 95% 98%  Weight:      Height:        Intake/Output Summary (Last 24 hours) at 09/26/2019 1820 Last data filed at 09/26/2019 1600 Gross per 24 hour  Intake 1269.51 ml  Output 200 ml  Net 1069.51 ml   Filed Weights   09/20/19 1756 09/20/19 1803 09/24/19 0500  Weight: 58.1 kg 53.5 kg 49.9 kg  General: alert and oriented to time, place, and person. Appear in mild distress, affect appropriate Eyes: PERRL, Conjunctiva normal ENT: Oral Mucosa Clear, moist  Neck: no JVD, no Abnormal Mass Or lumps Cardiovascular: S1 and S2 Present, no Murmur, peripheral pulses symmetrical Respiratory: good respiratory effort, Bilateral Air entry equal and Decreased, no signs of accessory muscle use, Clear to Auscultation, no Crackles, no wheezes Abdomen: Bowel Sound present, Soft and no tenderness, no hernia Skin: no rashes  Extremities: no Pedal edema, no calf tenderness  Neurologic: without any new focal findings Gait not checked due to patient safety concerns   Data Reviewed: I have personally reviewed and interpreted daily labs, tele strips, imagings as discussed above. I reviewed all nursing notes, pharmacy notes, vitals, pertinent old records I have discussed plan of care as described above with RN and patient/family.  CBC: Recent Labs  Lab 09/20/19 1813 09/23/19 0520 09/24/19 0155 09/26/19 0439  WBC 12.1* 6.8 6.6 6.4  NEUTROABS  --  3.9  --   --   HGB 16.4 14.4 13.7 13.1  HCT 47.5 40.7 39.6 36.7*  MCV 92.4 91.1 94.7 89.5  PLT 144* 75* 86* 229*   Basic Metabolic Panel: Recent Labs  Lab 09/20/19 1813 09/22/19 0419 09/23/19 0520 09/23/19 1452 09/24/19 0155 09/26/19 0439  NA 134*  --  143 142 143 138  K 4.1  --  2.9* 3.2* 3.7 3.7  CL 90*  --  101 104 108 105  CO2 27  --  26 27 25 24   GLUCOSE 103*  --  89 111* 150* 94  BUN 7*  --  19 19 19 13   CREATININE 0.43*  --  0.72 0.61 0.54* 0.49*  CALCIUM 9.1  --  8.9 8.9 9.6 8.9  MG  --   --  2.3 2.3 2.0  --   PHOS  --  3.9  --   --  2.6  --     Liver Function Tests: Recent Labs  Lab 09/20/19 1813 09/23/19 0520 09/24/19 0155  AST 60* 131* 103*  ALT 41 78* 83*  ALKPHOS 128* 84 115  BILITOT 1.0 1.3* 1.1  PROT 8.7* 7.8 7.2  ALBUMIN 4.8 4.3 4.0   No results for input(s): LIPASE, AMYLASE in the last 168 hours. No results for input(s): AMMONIA in the last 168 hours. Coagulation Profile: No results for input(s): INR, PROTIME in the last 168 hours. Cardiac Enzymes: No results for input(s): CKTOTAL, CKMB, CKMBINDEX, TROPONINI in the last 168 hours. BNP (last 3 results) No results for input(s): PROBNP in the last 8760 hours. CBG: Recent Labs  Lab 09/22/19 0351 09/22/19 2112 09/23/19 0735 09/23/19 1121  GLUCAP 137* 77 80 80   Studies: No results found.   Time spent: 35 minutes  Author: Berle Mull, MD [facility] 09/26/2019 6:20 PM  To reach On-call, see care  teams to locate the attending and reach out to them via [email or web address]. If 7PM-7AM, please contact night-coverage If you still have difficulty reaching the attending provider, please page the [facility] (Director on Call) for [facility] on amion for assistance.

## 2019-09-26 NOTE — Progress Notes (Signed)
Speech Therapy Note:  Per nursing, patient is tolerating his current diet. Per flow sheet, the patient ate 100% of lunch.  SLP will follow as needed.  Leroy Sea, MS/CCC- SLP

## 2019-09-26 NOTE — Evaluation (Signed)
Occupational Therapy Evaluation Patient Details Name: Douglas Peterson. MRN: [id number] DOB: 10/21/1951 Today's Date: 09/26/2019    History of Present Illness 68 yo male presented to ED with c/o of difficulty walking and requesting help with detox, pt had been drinking to treat his nausea, stated he has had several falls and has difficulty walking due to past hx of TBI with residual R sided weakness, PMH includes alcohol abuse, HTN, hyponatremia   Clinical Impression   [name] was seen for OT evaluation this date. Information regarding PLOF and home set-up obtain from chart review, as pt noted with limited cognition and oriented to self and limited situation only t/o OT evaluation. Per chart pt lives alone with limited assistance/support. Pt has had multiple hospital admissions in the past 6 months 2/2 alcohol withdrawal and falls. Pt  Currently pt demonstrates impairments in cognition, strength, activity tolerance, and functional UE use with his LUE limited by increased flexion tone through his elbow, wrist, and hand this date. Pt currently requires  Moderate assist for seated bathing, dressing, and grooming tasks. He requires minimal assistance for two handed self-feeding tasks including cutting foods and opening small items.  Pt would benefit from skilled OT to address noted impairments and functional limitations (see below for any additional details) in order to maximize safety and independence while minimizing falls risk and caregiver burden. Upon hospital discharge, recommend pt discharge to STR to maximize pt safety and return to PLOF.     Follow Up Recommendations  SNF    Equipment Recommendations  3 in 1 bedside commode    Recommendations for Other Services       Precautions / Restrictions Precautions Precautions: Fall Restrictions Weight Bearing Restrictions: No      Mobility Bed Mobility Overal bed mobility: Needs Assistance Bed Mobility: Rolling Rolling: Mod assist             Transfers Overall transfer level: Needs assistance Equipment used: Rolling walker (2 wheeled) Transfers: Sit to/from Stand Sit to Stand: Mod assist              Balance Overall balance assessment: Needs assistance;History of Falls Sitting-balance support: Feet supported;Single extremity supported Sitting balance-Leahy Scale: Fair   Postural control: Posterior lean Standing balance support: Bilateral upper extremity supported Standing balance-Leahy Scale: Poor Standing balance comment: mod A to maintain standing balance with RW, strong posterior lean                           ADL either performed or assessed with clinical judgement   ADL Overall ADL's : Needs assistance/impaired Eating/Feeding: Set up;Minimal assistance;Sitting;Bed level   Grooming: Min guard;Set up;Supervision/safety;Sitting;Cueing for sequencing;Cueing for safety   Upper Body Bathing: Sitting;Moderate assistance;Cueing for safety;Cueing for sequencing   Lower Body Bathing: Moderate assistance;Sitting/lateral leans;Cueing for safety;Cueing for sequencing   Upper Body Dressing : Sitting;Minimal assistance;Cueing for safety;Cueing for sequencing   Lower Body Dressing: Moderate assistance;Sitting/lateral leans;Sit to/from stand;Cueing for safety;Cueing for sequencing   Toilet Transfer: BSC;Stand-pivot;Moderate assistance;RW;Cueing for sequencing   Toileting- Clothing Manipulation and Hygiene: Sit to/from stand;Moderate assistance;Cueing for safety;Cueing for sequencing               Vision Patient Visual Report: No change from baseline       Perception     Praxis      Pertinent Vitals/Pain Pain Assessment: No/denies pain     Hand Dominance Right   Extremity/Trunk Assessment Upper Extremity Assessment Upper Extremity Assessment: Generalized  weakness;LUE deficits/detail LUE Deficits / Details: LUE remains flexed at the elbow with wrist extension and digits fisted  t/o session. Pt able to obtain minimal PROM finger extension with wrist in neutral this date but is unable to maintain without support. Pt was provided with a resting hand splint during his prior admission, but states it is at home. He has no one available to bring it to him. LUE Coordination: decreased fine motor;decreased gross motor   Lower Extremity Assessment Lower Extremity Assessment: Generalized weakness;Defer to PT evaluation       Communication Communication Communication: No difficulties   Cognition Arousal/Alertness: Awake/alert Behavior During Therapy: WFL for tasks assessed/performed Overall Cognitive Status: No family/caregiver present to determine baseline cognitive functioning                                 General Comments: Pt oriented to self and limited situation. He states he is "Somewhere in the [place]' but then states "Oh that was from a dream" when this [place]ia orients him to place. He is able to state he is in a healthcare facility, but believes he is in [place].   General Comments       Exercises Other Exercises Other Exercises: Pt educated in falls prevention strategies for home and hospital and safe positioning strategies for his LUE. Other Exercises: OT assists with UB dressing and bedding change (with NT) this date. Pt requires mod assist for donning gown with limited functional use of his LUE.   Shoulder Instructions      Home Living Family/patient expects to be discharged to:: Private residence Living Arrangements: Alone Available Help at Discharge: Family;Available PRN/intermittently(Pt states brother and sister live nearby, however has also stated he has no familial support.) Type of Home: House Home Access: Stairs to enter [employer] of Steps: 5 in front,  3 in back, per chart review from previous encounter 2 Entrance Stairs-Rails: Can reach both Home Layout: One level     Bathroom Shower/Tub: Animal nutritionist: Standard     Home Equipment: Environmental consultant - 2 wheels;Cane - single point;Shower seat;Grab bars - tub/shower;Grab bars - toilet;Other (comment)   Additional Comments: LUE Resting hand splint (pt states it is at home)      Prior Functioning/Environment Level of Independence: Independent        Comments: Pt is unreliable historian. Has had multiple hospital admissions and falls in the past 6 months though he reports he is totally independent and using no AD PTA.        OT Problem List: Decreased strength;Decreased coordination;Decreased range of motion;Decreased cognition;Decreased activity tolerance;Decreased safety awareness;Impaired balance (sitting and/or standing);Impaired UE functional use;Decreased knowledge of use of DME or AE      OT Treatment/Interventions: Self-care/ADL training;Balance training;Therapeutic exercise;Therapeutic activities;Energy conservation;DME and/or AE instruction;Patient/family education    OT Goals(Current goals can be found in the care plan section) Acute Rehab OT Goals Patient Stated Goal: go home OT Goal Formulation: With patient Time For Goal Achievement: 10/10/19 Potential to Achieve Goals: Good ADL Goals Pt Will Perform Grooming: sitting;with supervision;with set-up(With LRAD PRN for improved safety and functional independence.) Pt Will Perform Upper Body Dressing: sitting;with min assist(With LRAD PRN for improved safety and functional independence.) Pt Will Perform Lower Body Dressing: with min assist;sit to/from stand;with adaptive equipment(With LRAD PRN for improved safety and functional independence.)  OT Frequency: Min 1X/week   Barriers to D/C: Inaccessible home environment;Decreased caregiver  support          Co-evaluation              AM-PAC OT "6 Clicks" Daily Activity     Outcome Measure Help from another person eating meals?: A Little Help from another person taking care of personal grooming?: A  Little Help from another person toileting, which includes using toliet, bedpan, or urinal?: A Lot Help from another person bathing (including washing, rinsing, drying)?: A Lot Help from another person to put on and taking off regular upper body clothing?: A Lot Help from another person to put on and taking off regular lower body clothing?: A Lot 6 Click Score: 14   End of Session Equipment Utilized During Treatment: Gait belt;Rolling walker  Activity Tolerance: Patient tolerated treatment well Patient left: in bed;with call bell/phone within reach;with bed alarm set;Other (comment)(With prevalon boots reapplied)  OT Visit Diagnosis: Other abnormalities of gait and mobility (R26.89);Muscle weakness (generalized) (M62.81);History of falling (Z91.81)                Time: 5284-1324 OT Time Calculation (min): 22 min Charges:  OT General Charges $OT Visit: 1 Visit OT Evaluation $OT Eval Moderate Complexity: 1 Mod OT Treatments $Self Care/Home Management : 8-22 mins  Shara Blazing, M.S., OTR/L Ascom: [phone] 09/26/19, 9:49 AM

## 2019-09-27 DIAGNOSIS — R1310 Dysphagia, unspecified: Secondary | ICD-10-CM

## 2019-09-27 DIAGNOSIS — D6959 Other secondary thrombocytopenia: Secondary | ICD-10-CM | POA: Diagnosis present

## 2019-09-27 DIAGNOSIS — F1020 Alcohol dependence, uncomplicated: Secondary | ICD-10-CM | POA: Diagnosis present

## 2019-09-27 DIAGNOSIS — E876 Hypokalemia: Secondary | ICD-10-CM | POA: Diagnosis present

## 2019-09-27 LAB — BASIC METABOLIC PANEL
Anion gap: 11 (ref 5–15)
BUN: 19 mg/dL (ref 8–23)
CO2: 26 mmol/L (ref 22–32)
Calcium: 9.4 mg/dL (ref 8.9–10.3)
Chloride: 103 mmol/L (ref 98–111)
Creatinine, Ser: 0.46 mg/dL — ABNORMAL LOW (ref 0.61–1.24)
GFR calc Af Amer: >60 mL/min (ref >60)
GFR calc non Af Amer: >60 mL/min (ref >60)
Glucose, Bld: 96 mg/dL (ref 70–99)
Potassium: 3.9 mmol/L (ref 3.5–5.1)
Sodium: 140 mmol/L (ref 135–145)

## 2019-09-27 LAB — CBC
HCT: 40 % (ref 39.0–52.0)
Hemoglobin: 14.2 g/dL (ref 13.0–17.0)
MCH: 31.8 pg (ref 26.0–34.0)
MCHC: 35.5 g/dL (ref 30.0–36.0)
MCV: 89.5 fL (ref 80.0–100.0)
Platelets: 152 10*3/uL (ref 150–400)
RBC: 4.47 MIL/uL (ref 4.22–5.81)
RDW: 12.1 % (ref 11.5–15.5)
WBC: 7.1 10*3/uL (ref 4.0–10.5)
nRBC: 0 % (ref 0.0–0.2)

## 2019-09-27 MED ORDER — ATENOLOL 25 MG PO TABS
25.0000 mg | ORAL_TABLET | Freq: Every evening | ORAL | Status: DC
  Administered 2019-09-27 – 2019-10-02 (×6): 25 mg via ORAL
  Filled 2019-09-27 (×6): qty 1

## 2019-09-27 NOTE — Progress Notes (Signed)
PROGRESS NOTE  [name] HUD:[id number] DOB: 1951-03-25 DOA: 09/20/2019 PCP: [name], MD  HPI/Recap of past 24 hours: [name] a 68 year old male with past medical history of essential hypertension and alcoholism, was had multiple hospitalizations for alcohol withdrawals or trying to detox.  According to social work, is also had difficult placement issues because he will go to a skilled nursing facility check himself out and then drink some more.  Patient presented on 11/17 for confusion and was found to have alcohol induced delirium.  The rest of his work-up was unrevealing.  He was felt to have thiamine deficiency and started receiving supplementation for this.  His mentation is actually cleared up he is currently alert and oriented.  He is found to be however extremely deconditioned and malnourished.  Attempts to find a skilled nursing facility take him have been quite limited.  Today, patient is doing okay with no complaints.  Assessment/Plan: Active Problems:   Hyponatremia: Secondary to alcohol intake and dehydration.  Resolved.  Sodium today at 140.    HTN (hypertension): Initially beta-blocker on hold due to soft blood pressure from dehydration.  Pressures are starting to slowly increase.  Will restart.    Malnutrition of moderate degree: Chronic secondary to his alcoholism.  Nutrition consulted.    Alcohol withdrawal delirium, acute, hyperactive (HCC) Leukocytosis: Likely stress margination.  Infection work-up negative.  Briefly was on antibiotics which have been discontinued.  Hypokalemia: Replaced.  Dysphagia: Seen by speech therapy and placed on dysphagia 1 diet.  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  Code Status: Full code  Family Communication: Left message for daughter  Disposition Plan: Skilled nursing facility search underway   Consultants:  None  Procedures:  None  Antimicrobials:  IV Rocephin 11/18-11/20   DVT prophylaxis: SCDs   Objective: Vitals:   09/27/19 0830 09/27/19 1437  BP: (!) 142/92 (!) 138/91  Pulse: 74 86  Resp: 17 14  Temp: 98.2 F (36.8 C) 98.7 F (37.1 C)  SpO2: 97% 97%    Intake/Output Summary (Last 24 hours) at 09/27/2019 1705 Last data filed at 09/27/2019 1004 Gross per 24 hour  Intake 360 ml  Output -  Net 360 ml   Filed Weights   09/20/19 1756 09/20/19 1803 09/24/19 0500  Weight: 58.1 kg 53.5 kg 49.9 kg   Body mass index is 18.9 kg/m.  Exam:   General: Alert and oriented x3, no acute distress  HEENT: Normocephalic and atraumatic, mucous membranes are slightly dry  Cardiovascular: Regular rate and rhythm, S1-S2  Respiratory: Clear to auscultation bilaterally  Abdomen: Soft, nontender, nondistended, positive bowel sounds  Musculoskeletal: Hands are contracted and deconditioning  Skin: Superficial bruising  Psychiatry: Appropriate, no evidence of psychoses   Data Reviewed: CBC: Recent Labs  Lab 09/20/19 1813 09/23/19 0520 09/24/19 0155 09/26/19 0439 09/27/19 0427  WBC 12.1* 6.8 6.6 6.4 7.1  NEUTROABS  --  3.9  --   --   --   HGB 16.4 14.4 13.7 13.1 14.2  HCT 47.5 40.7 39.6 36.7* 40.0  MCV 92.4 91.1 94.7 89.5 89.5  PLT 144* 75* 86* 117* 858   Basic Metabolic Panel: Recent Labs  Lab 09/22/19 0419 09/23/19 0520 09/23/19 1452 09/24/19 0155 09/26/19 0439 09/27/19 0427  NA  --  143 142 143 138 140  K  --  2.9* 3.2* 3.7 3.7 3.9  CL  --  101 104 108 105 103  CO2  --  26 27 25 24  26  GLUCOSE  --  89 111* 150* 94 96  BUN  --  19 19 19 13 19   CREATININE  --  0.72 0.61 0.54* 0.49* 0.46*  CALCIUM  --  8.9 8.9 9.6 8.9 9.4  MG  --  2.3 2.3 2.0  --   --   PHOS 3.9  --   --  2.6  --   --    GFR: Estimated Creatinine Clearance: 62.4 mL/min (A) (by C-G formula based on SCr of 0.46 mg/dL (L)). Liver Function Tests: Recent Labs  Lab 09/20/19 1813 09/23/19 0520 09/24/19 0155  AST 60* 131* 103*  ALT 41 78* 83*  ALKPHOS 128*  84 115  BILITOT 1.0 1.3* 1.1  PROT 8.7* 7.8 7.2  ALBUMIN 4.8 4.3 4.0   No results for input(s): LIPASE, AMYLASE in the last 168 hours. No results for input(s): AMMONIA in the last 168 hours. Coagulation Profile: No results for input(s): INR, PROTIME in the last 168 hours. Cardiac Enzymes: No results for input(s): CKTOTAL, CKMB, CKMBINDEX, TROPONINI in the last 168 hours. BNP (last 3 results) No results for input(s): PROBNP in the last 8760 hours. HbA1C: No results for input(s): HGBA1C in the last 72 hours. CBG: Recent Labs  Lab 09/22/19 0351 09/22/19 2112 09/23/19 0735 09/23/19 1121  GLUCAP 137* 77 80 80   Lipid Profile: No results for input(s): CHOL, HDL, LDLCALC, TRIG, CHOLHDL, LDLDIRECT in the last 72 hours. Thyroid Function Tests: No results for input(s): TSH, T4TOTAL, FREET4, T3FREE, THYROIDAB in the last 72 hours. Anemia Panel: No results for input(s): VITAMINB12, FOLATE, FERRITIN, TIBC, IRON, RETICCTPCT in the last 72 hours. Urine analysis:    Component Value Date/Time   COLORURINE YELLOW (A) 09/21/2019 0102   APPEARANCEUR CLEAR (A) 09/21/2019 0102   LABSPEC 1.010 09/21/2019 0102   PHURINE 7.0 09/21/2019 0102   GLUCOSEU NEGATIVE 09/21/2019 0102   HGBUR NEGATIVE 09/21/2019 0102   BILIRUBINUR NEGATIVE 09/21/2019 0102   KETONESUR NEGATIVE 09/21/2019 0102   PROTEINUR NEGATIVE 09/21/2019 0102   NITRITE NEGATIVE 09/21/2019 0102   LEUKOCYTESUR NEGATIVE 09/21/2019 0102   Sepsis Labs: @LABRCNTIP (procalcitonin:4,lacticidven:4)  ) Recent Results (from the past 240 hour(s))  SARS CORONAVIRUS 2 (TAT 6-24 HRS) Nasopharyngeal Nasopharyngeal Swab     Status: None   Collection Time: 09/21/19  1:02 AM   Specimen: Nasopharyngeal Swab  Result Value Ref Range Status   SARS Coronavirus 2 NEGATIVE NEGATIVE Final    Comment: (NOTE) SARS-CoV-2 target nucleic acids are NOT DETECTED. The SARS-CoV-2 RNA is generally detectable in upper and lower respiratory specimens during the  acute phase of infection. Negative results do not preclude SARS-CoV-2 infection, do not rule out co-infections with other pathogens, and should not be used as the sole basis for treatment or other patient management decisions. Negative results must be combined with clinical observations, patient history, and epidemiological information. The expected result is Negative. Fact Sheet for Patients: SugarRoll.be Fact Sheet for Healthcare Providers: https://www.woods-mathews.com/ This test is not yet approved or cleared by the [place] FDA and  has been authorized for detection and/or diagnosis of SARS-CoV-2 by FDA under an Emergency Use Authorization (EUA). This EUA will remain  in effect (meaning this test can be used) for the duration of the COVID-19 declaration under Section 56 4(b)(1) of the Act, 21 U.S.C. section 360bbb-3(b)(1), unless the authorization is terminated or revoked sooner. Performed at [facility], [place] [address] [place], [place] [address]   MRSA PCR Screening     Status: None  Collection Time: 09/22/19  4:09 AM   Specimen: Nasopharyngeal  Result Value Ref Range Status   MRSA by PCR NEGATIVE NEGATIVE Final    Comment:        The GeneXpert MRSA Assay (FDA approved for NASAL specimens only), is one component of a comprehensive MRSA colonization surveillance program. It is not intended to diagnose MRSA infection nor to guide or monitor treatment for MRSA infections. Performed at [facility], [address], [place], [place] [address]       Studies: No results found.  Scheduled Meds: . aspirin EC  81 mg Oral Daily  . Chlorhexidine Gluconate Cloth  6 each Topical Q0600  . feeding supplement (ENSURE ENLIVE)  237 mL Oral BID BM  . multivitamin with minerals  1 tablet Oral Daily  . PARoxetine  30 mg Oral Daily  . thiamine  100 mg Oral Daily  . vitamin C  1,000 mg Oral Daily    Continuous  Infusions:   LOS: 6 days     Annita Brod, MD [facility]  To reach me or the doctor on call, go to: [email or web address] Password [email or web address]  09/27/2019, 5:05 PM

## 2019-09-27 NOTE — Progress Notes (Signed)
Physical Therapy Treatment Patient Details Name: Douglas Peterson. MRN: [id number] DOB: 06/07/1951 Today's Date: 09/27/2019    History of Present Illness 68 yo male presented to ED with c/o of difficulty walking and requesting help with detox, pt had been drinking to treat his nausea, stated he has had several falls and has difficulty walking due to past hx of TBI with residual R sided weakness, PMH includes alcohol abuse, HTN, hyponatremia    PT Comments    Pt was eager to work with PT and showed great effort, however he remains very weak and limited with mobility/standing.  Despite raising bed, multiple attempts, a lot of cuing pt struggled to get to fully upright standing and never did shift weight forward enough to maintain standing w/o at least some assist to keep from falling back.  Pt showed good effort with supine exercises and showed better strength than expected given his significant functional limitations.  Could be beneficial to try hemiwalker for next standing attempt.  Follow Up Recommendations  SNF     Equipment Recommendations  Other (comment)(TBD)    Recommendations for Other Services       Precautions / Restrictions Precautions Precautions: Fall Restrictions Weight Bearing Restrictions: No    Mobility  Bed Mobility Overal bed mobility: Needs Assistance Bed Mobility: Supine to Sit;Sit to Supine     Supine to sit: Min assist Sit to supine: Min assist   General bed mobility comments: Pt was determined to get to EOB w/o assist, but ultimatelky did need light assist to attain upright and then more-so to scoot to EOB  Transfers Overall transfer level: Needs assistance Equipment used: Straight cane Transfers: Sit to/from Stand Sit to Stand: Max assist         General transfer comment: pt able to get to standing with assist, but was never able to shift weight forward enough to require less than min assist to keep from falling back.[name]  Despite heavy cuing on 3  attempts he could not manage getting weight forward or onto cane appropriately  Ambulation/Gait             General Gait Details: unable to attempt ambulation this date   Stairs             Wheelchair Mobility    Modified Rankin (Stroke Patients Only)       Balance Overall balance assessment: Needs assistance;History of Falls Sitting-balance support: Feet supported;Single extremity supported Sitting balance-Leahy Scale: Fair       Standing balance-Leahy Scale: Zero Standing balance comment: pt unable to get weight off his heels                            Cognition Arousal/Alertness: Awake/alert Behavior During Therapy: WFL for tasks assessed/performed Overall Cognitive Status: Within Functional Limits for tasks assessed                                        Exercises General Exercises - Lower Extremity Ankle Circles/Pumps: Strengthening;10 reps Quad Sets: Strengthening;10 reps Heel Slides: Strengthening;10 reps Hip ABduction/ADduction: Strengthening;10 reps    General Comments        Pertinent Vitals/Pain Pain Assessment: No/denies pain    Home Living                      Prior Function  PT Goals (current goals can now be found in the care plan section) Progress towards PT goals: Progressing toward goals    Frequency    Min 2X/week      PT Plan Current plan remains appropriate    Co-evaluation              AM-PAC PT "6 Clicks" Mobility   Outcome Measure  Help needed turning from your back to your side while in a flat bed without using bedrails?: None Help needed moving from lying on your back to sitting on the side of a flat bed without using bedrails?: A Little Help needed moving to and from a bed to a chair (including a wheelchair)?: Total Help needed standing up from a chair using your arms (e.g., wheelchair or bedside chair)?: A Lot Help needed to walk in hospital room?:  Total Help needed climbing 3-5 steps with a railing? : Total 6 Click Score: 12    End of Session Equipment Utilized During Treatment: Gait belt Activity Tolerance: Patient tolerated treatment well;Patient limited by fatigue Patient left: with bed alarm set;with call bell/phone within reach Nurse Communication: Mobility status(wants bath) PT Visit Diagnosis: Muscle weakness (generalized) (M62.81);Difficulty in walking, not elsewhere classified (R26.2);Unsteadiness on feet (R26.81)     Time: 3419-3790 PT Time Calculation (min) (ACUTE ONLY): 32 min  Charges:  $Therapeutic Exercise: 8-22 mins $Therapeutic Activity: 8-22 mins                     Kreg Shropshire, DPT 09/27/2019, 1:19 PM

## 2019-09-28 DIAGNOSIS — D6959 Other secondary thrombocytopenia: Secondary | ICD-10-CM

## 2019-09-28 DIAGNOSIS — E871 Hypo-osmolality and hyponatremia: Secondary | ICD-10-CM

## 2019-09-28 DIAGNOSIS — F10239 Alcohol dependence with withdrawal, unspecified: Secondary | ICD-10-CM

## 2019-09-28 DIAGNOSIS — I10 Essential (primary) hypertension: Secondary | ICD-10-CM

## 2019-09-28 DIAGNOSIS — E440 Moderate protein-calorie malnutrition: Secondary | ICD-10-CM

## 2019-09-28 DIAGNOSIS — F10929 Alcohol use, unspecified with intoxication, unspecified: Secondary | ICD-10-CM

## 2019-09-28 DIAGNOSIS — R1310 Dysphagia, unspecified: Secondary | ICD-10-CM

## 2019-09-28 DIAGNOSIS — F1020 Alcohol dependence, uncomplicated: Secondary | ICD-10-CM

## 2019-09-28 DIAGNOSIS — E876 Hypokalemia: Secondary | ICD-10-CM

## 2019-09-28 MED ORDER — ENOXAPARIN SODIUM 40 MG/0.4ML ~~LOC~~ SOLN
40.0000 mg | SUBCUTANEOUS | Status: DC
  Administered 2019-09-28 – 2019-10-02 (×5): 40 mg via SUBCUTANEOUS
  Filled 2019-09-28 (×5): qty 0.4

## 2019-09-28 NOTE — NC FL2 (Signed)
[place] LEVEL OF CARE SCREENING TOOL     IDENTIFICATION  Patient Name: Douglas Peterson. Birthdate: Apr 18, 1951 Sex: male Admission Date (Current Location): 09/20/2019  [place] and [place] Number:  Engineering geologist and Address:  [facility], [address], [place], [place] [address]      Provider Number: [id number]  Attending Physician Name and Address:  Lorella Nimrod, MD  Relative Name and Phone Number:  Lance Sell [phone]    Current Level of Care: Hospital Recommended Level of Care: [place] Prior Approval Number:    Date Approved/Denied:   PASRR Number: [id number]  Discharge Plan: SNF    Current Diagnoses: Patient Active Problem List   Diagnosis Date Noted  . Alcoholic intoxication with complication ([place])   . 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 Hypokalemia 09/27/2019  . Dysphagia 09/27/2019  . Alcohol withdrawal delirium, acute, hyperactive ([place]) 09/21/2019  . Pressure injury of skin 08/15/2019  . Goals of care, counseling/discussion   . Palliative care by specialist   . Alcohol withdrawal ([place]) 03/20/2019  . Low back pain 11/15/2018  . AMS (altered mental status) 10/12/2018  . Delirium tremens ([place]) 03/08/2018  . Malnutrition of moderate degree 11/24/2017  . HTN (hypertension) 09/16/2017  . Hypothermia 12/17/2016  . Alcohol use   . Diarrhea   . Hyponatremia 07/09/2015    Orientation RESPIRATION BLADDER Height & Weight     Self, Time, Situation, Place  Normal Continent Weight: 49.9 kg Height:  5' 4"  (162.6 cm)  BEHAVIORAL SYMPTOMS/MOOD NEUROLOGICAL BOWEL NUTRITION STATUS      Continent Diet(dysphagia diet 1 thickened liquids)  AMBULATORY STATUS COMMUNICATION OF NEEDS Skin   Extensive Assist Verbally Bruising, Skin abrasions                       Personal Care Assistance Level of Assistance  Bathing, Feeding, Dressing Bathing Assistance:  Maximum assistance Feeding assistance: Limited assistance Dressing Assistance: Maximum assistance     Functional Limitations Info             SPECIAL CARE FACTORS FREQUENCY  PT (By licensed PT), OT (By licensed OT)     PT Frequency: 5 times per week OT Frequency: 5 times per week            Contractures Contractures Info: Present(right hand)    Additional Factors Info  Allergies, Code Status Code Status Info: Full Allergies Info: NKA           Current Medications (09/28/2019):  This is the current hospital active medication list Current Facility-Administered Medications  Medication Dose Route Frequency Provider Last Rate Last Dose  . aspirin EC tablet 81 mg  81 mg Oral Daily Lavina Hamman, MD   81 mg at 09/28/19 0931  . atenolol (TENORMIN) tablet 25 mg  25 mg Oral QPM Annita Brod, MD   25 mg at 09/27/19 1722  . Chlorhexidine Gluconate Cloth 2 % PADS 6 each  6 each Topical Q0600 Lavina Hamman, MD   6 each at 09/27/19 1026  . enoxaparin (LOVENOX) injection 40 mg  40 mg Subcutaneous Q24H Lorella Nimrod, MD   40 mg at 09/28/19 1345  . hydrOXYzine (ATARAX/VISTARIL) tablet 25 mg  25 mg Oral TID PRN Lavina Hamman, MD   25 mg at 09/27/19 1250  . multivitamin with minerals tablet 1 tablet  1 tablet Oral Daily Lavina Hamman, MD   1 tablet  at 09/28/19 0931  . PARoxetine (PAXIL) tablet 30 mg  30 mg Oral Daily Lavina Hamman, MD   30 mg at 09/28/19 0931  . thiamine (VITAMIN B-1) tablet 100 mg  100 mg Oral Daily Lavina Hamman, MD   100 mg at 09/28/19 0931  . vitamin C (ASCORBIC ACID) tablet 1,000 mg  1,000 mg Oral Daily Lavina Hamman, MD   1,000 mg at 09/28/19 7445     Discharge Medications: Please see discharge summary for a list of discharge medications.  Relevant Imaging Results:  Relevant Lab Results:   Additional Information SS# [id number]  Shelbie Hutching, RN

## 2019-09-28 NOTE — Progress Notes (Signed)
Initial Nutrition Assessment  RD working remotely.  DOCUMENTATION CODES:   Not applicable  INTERVENTION:  Discontinued Ensure Enlive as patient is on honey-thick liquids.  Provide Magic cup TID with meals, each supplement provides 290 kcal and 9 grams of protein.  Continue MVI daily and thiamine 100 mg daily. Also recommend folic acid 1 mg daily.  NUTRITION DIAGNOSIS:   Increased nutrient needs related to wound healing as evidenced by estimated needs.  GOAL:   Patient will meet greater than or equal to 90% of their needs  MONITOR:   PO intake, Supplement acceptance, Diet advancement, Labs, Weight trends, Skin, I & O's  REASON FOR ASSESSMENT:   Consult Assessment of nutrition requirement/status  ASSESSMENT:   68 year old male with PMHx of HTN, EtOH abuse, hyponatremia admitted with EtOH withdrawal delirium.   RD has attempted to call patient over the phone several times today. On last call he did answer phone but sounded very short of breath. He could only report that his appetite was good but then was unable to answer any further questions. Following SLP evaluation on 11/20 patient was put on dysphagia 1 diet with honey-thick liquids. According to chart he is tolerating well. [name] consuming 65-100% of his meals. Patient had stage II pressure ulcer documented first on 10/11 (still on LDA but not currently documented in skin section of chart).  Difficult to trend patient's weight as it has been fluctuating significantly, even in this admission. He is currently documented to be 49.9 kg (110.1 lbs) but two weeks ago was documented to be 58.1 kg.   Medications reviewed and include: MVI daily, thiamine 100 mg daily, vitamin C 1000 mg daily.  Labs reviewed.  RD suspects patient is malnourished but unable to confirm without completing NFPE.  NUTRITION - FOCUSED PHYSICAL EXAM:  Unable to complete at this time.  Diet Order:   Diet Order            DIET - DYS 1 Room  service appropriate? Yes; Fluid consistency: Honey Thick  Diet effective now             EDUCATION NEEDS:   No education needs have been identified at this time  Skin:  Skin Assessment: Reviewed RN Assessment  Last BM:  09/28/2019 - medium type 5  Height:   Ht Readings from Last 1 Encounters:  09/24/19 5' 4"  (1.626 m)   Weight:   Wt Readings from Last 1 Encounters:  09/24/19 49.9 kg   Ideal Body Weight:  59.1 kg  BMI:  Body mass index is 18.9 kg/m.  Estimated Nutritional Needs:   Kcal:  1600-1800  Protein:  80-90 grams  Fluid:  1.6-1.8 L/day  Jacklynn Barnacle, MS, RD, LDN Office: [phone] Pager: [phone] After Hours/Weekend Pager: [phone]

## 2019-09-28 NOTE — Progress Notes (Signed)
Speech Therapy Note:  The patient is tolerating his diet without reported difficulties.  This is his recommended diet per his last objective swallow study.  Recommend that the patient be assessed in his next setting for diet toleration.  Will sign off at this time.  Please re-consult SLP with any difficulties with swallowing.  Leroy Sea, MS/CCC- SLP

## 2019-09-28 NOTE — TOC Progression Note (Signed)
Transition of Care (TOC) - Progression Note    Patient Details  Name: Douglas Peterson. MRN: [id number] Date of Birth: 16-Feb-1951  Transition of Care [facility]) CM/SW Contact  Shelbie Hutching, RN Phone Number: 09/28/2019, 3:11 PM  Clinical Narrative:    Patient was in agreement to go to SNF today.  Patient's sister, [name] was in the room visiting and help persuade patient that rehab would be a good idea.  Patient cannot walk at this time and requires heavy assistance.  Patient lives alone in [place] and has limited home support because of his substance abuse with alcohol.  Patient has been to [facility] in the past and would like to go there again.  Bed request sent to [facility].    Expected Discharge Plan: [place] Barriers to Discharge: Continued Medical Work up  Expected Discharge Plan and Services Expected Discharge Plan: [place]   Discharge Planning Services: CM Consult   Living arrangements for the past 2 months: Single Family Home                                       Social Determinants of Health (SDOH) Interventions    Readmission Risk Interventions No flowsheet data found.

## 2019-09-28 NOTE — Progress Notes (Signed)
Occupational Therapy Treatment Patient Details Name: Douglas Peterson. MRN: [id number] DOB: Feb 12, 1951 Today's Date: 09/28/2019    History of present illness 68 yo male presented to ED with c/o of difficulty walking and requesting help with detox, pt had been drinking to treat his nausea, stated he has had several falls and has difficulty walking due to past hx of TBI with residual R sided weakness, PMH includes alcohol abuse, HTN, hyponatremia   OT comments  Pt seen this date for OT treatment to address f/u re: safety with ADL self care and ADL mobility. Pt requires MOD/MAX A for toileting and bathing tasks as well as MIN/MOD A for ADL transfers and shuffling steps to/from [facility]. OT recommendation of SNF upon hospital discharge continues to prove most prudent d/c disposition.    Follow Up Recommendations  SNF    Equipment Recommendations       Recommendations for Other Services      Precautions / Restrictions Precautions Precautions: Fall Restrictions Weight Bearing Restrictions: No       Mobility Bed Mobility Overal bed mobility: Needs Assistance Bed Mobility: Supine to Sit;Sit to Supine     Supine to sit: Min guard Sit to supine: Min assist   General bed mobility comments: MIN A for LE support for sit to sup  Transfers Overall transfer level: Needs assistance Equipment used: Rolling walker (2 wheeled)(OT has to manually place pt's R hand on gripper.) Transfers: Sit to/from Stand Sit to Stand: Min assist              Balance Overall balance assessment: Needs assistance Sitting-balance support: Feet supported Sitting balance-Leahy Scale: Fair Sitting balance - Comments: pt with posterior lean, requires verbal cues to self-correct and return to midline Postural control: Posterior lean Standing balance support: Bilateral upper extremity supported Standing balance-Leahy Scale: Fair Standing balance comment: Pt with all weight oriented posteriorly requiring  consistent assistance to maintain static stand                           ADL either performed or assessed with clinical judgement   ADL       Grooming: Oral care;Set up;Minimal assistance;Sitting Grooming Details (indicate cue type and reason): OT holds basin for pt to spit and assists with opening/closing toothpaste tube Upper Body Bathing: Sitting;Minimal assistance;Moderate assistance;Set up Upper Body Bathing Details (indicate cue type and reason): to assist with washing under L UE d/t limited AROM of R UE. Lower Body Bathing: Maximal assistance;Sit to/from stand Lower Body Bathing Details (indicate cue type and reason): with RW, sit<>staqnd from EOB, MAX A for peri hygiene in standing as pt requies b/l UEs for support on RW (OT has to place R hand on walker secondary to contractures. Upper Body Dressing : Sitting;Minimal assistance;Cueing for sequencing   Lower Body Dressing: Moderate assistance;Sit to/from stand;Cueing for safety   Toilet Transfer: BSC;Moderate assistance;RW;Cueing for sequencing Toilet Transfer Details (indicate cue type and reason): pt takes 4-5 shuffling side steps to his R towards BSC and to his L back to EOB with RW. Toileting- Clothing Manipulation and Hygiene: Moderate assistance;Sit to/from stand Toileting - Clothing Manipulation Details (indicate cue type and reason): MOD A with RW             Vision Patient Visual Report: No change from baseline     Perception     Praxis      Cognition Arousal/Alertness: Awake/alert Behavior During Therapy: WFL for tasks assessed/performed Overall Cognitive  Status: Within Functional Limits for tasks assessed                                 General Comments: Pt appropriately states place and situation today demo'ing improved cog level versus recording from 2 days ago.        Exercises Other Exercises Other Exercises: OT facilitates education re: discharge recommendations, pt  agreeable to SNF at d/c for safety. Other Exercises: OT facilitates education re: modified dressing and bathing techniques d/t decreased AROM of R UE. Pt demos good understanding, but requires f/u. Other Exercises: OT facilitates education with pt re: safe use of walker including hand placement, reaching back to control descent. Pt verbalized understanding.   Shoulder Instructions       General Comments      Pertinent Vitals/ Pain       Pain Assessment: No/denies pain  Home Living                                          Prior Functioning/Environment              Frequency  Min 1X/week        Progress Toward Goals  OT Goals(current goals can now be found in the care plan section)  Progress towards OT goals: Progressing toward goals  Acute Rehab OT Goals Patient Stated Goal: go home OT Goal Formulation: With patient Time For Goal Achievement: 10/10/19 Potential to Achieve Goals: Good  Plan Discharge plan remains appropriate    Co-evaluation                 AM-PAC OT "6 Clicks" Daily Activity     Outcome Measure   Help from another person eating meals?: A Little Help from another person taking care of personal grooming?: A Little Help from another person toileting, which includes using toliet, bedpan, or urinal?: A Lot Help from another person bathing (including washing, rinsing, drying)?: A Lot Help from another person to put on and taking off regular upper body clothing?: A Lot Help from another person to put on and taking off regular lower body clothing?: A Lot 6 Click Score: 14    End of Session Equipment Utilized During Treatment: Gait belt;Rolling walker  OT Visit Diagnosis: Other abnormalities of gait and mobility (R26.89);Muscle weakness (generalized) (M62.81);History of falling (Z91.81)   Activity Tolerance Patient tolerated treatment well   Patient Left in bed;with call bell/phone within reach;with bed alarm set;with SCD's  reapplied   Nurse Communication (notified CNA that pt had large BM in [facility] during OT.)        Time: 9892-1194 OT Time Calculation (min): 47 min  Charges: OT General Charges $OT Visit: 1 Visit OT Treatments $Self Care/Home Management : 23-37 mins $Therapeutic Activity: 8-22 mins    Gerrianne Scale, MS, OTR/L ascom [phone] or [phone] 09/28/19, 4:39 PM

## 2019-09-28 NOTE — Progress Notes (Signed)
Physical Therapy Treatment Patient Details Name: Douglas Peterson. MRN: [id number] DOB: [birth year] Today's Date: 09/28/2019    History of Present Illness 68 yo male presented to ED with c/o of difficulty walking and requesting help with detox, pt had been drinking to treat his nausea, stated he has had several falls and has difficulty walking due to past hx of TBI with residual R sided weakness, PMH includes alcohol abuse, HTN, hyponatremia    PT Comments    Patient received in bed, agreeable to PT. Pulling off bunny boots in preparation for mobility. Patient difficult to understand at times due to low volume and mask. He requires min assist to get from supine to sit. Min assist for sit to stand. He is able to walk 5 feet to recliner with hemi-walker and min guard/assist. LE strengthening exercises performed at end of session. Patient will continue to benefit from skilled PT while here to improve strength and functional mobility.      Follow Up Recommendations  SNF     Equipment Recommendations  None recommended by PT    Recommendations for Other Services       Precautions / Restrictions Precautions Precautions: Fall Restrictions Weight Bearing Restrictions: No    Mobility  Bed Mobility Overal bed mobility: Needs Assistance Bed Mobility: Supine to Sit     Supine to sit: Min guard     General bed mobility comments: min guard to support trunk to full sitting at EOB  Transfers Overall transfer level: Needs assistance Equipment used: Hemi-walker Transfers: Sit to/from Stand Sit to Stand: Min assist         General transfer comment: Improved ability to stand this session. Min assist.  Ambulation/Gait Ambulation/Gait assistance: Min guard;Min assist Gait Distance (Feet): 5 Feet Assistive device: Hemi-walker Gait Pattern/deviations: Step-to pattern;Shuffle;Decreased stride length;Narrow base of support;Decreased step length - right;Decreased step length - left Gait  velocity: decreased   General Gait Details: ambulated 5 feet to chair with small shuffle steps. Min guard   Marine scientist [place] (Stroke Patients Only)       Balance Overall balance assessment: Needs assistance Sitting-balance support: Feet supported Sitting balance-Leahy Scale: Fair     Standing balance support: Single extremity supported;During functional activity Standing balance-Leahy Scale: Fair                              Cognition Arousal/Alertness: Awake/alert Behavior During Therapy: WFL for tasks assessed/performed Overall Cognitive Status: Within Functional Limits for tasks assessed                                        Exercises Other Exercises Other Exercises: seated B LE exercises: LAQ, Marching x 20 reps each, AP, hip abd/add, SLR x 10 reps    General Comments        Pertinent Vitals/Pain Pain Assessment: No/denies pain    Home Living                      Prior Function            PT Goals (current goals can now be found in the care plan section) Acute Rehab PT Goals Patient Stated Goal: go home PT Goal Formulation: With patient Time For Goal Achievement:  10/09/19 Potential to Achieve Goals: Fair Progress towards PT goals: Progressing toward goals    Frequency    Min 2X/week      PT Plan Current plan remains appropriate    Co-evaluation              AM-PAC PT "6 Clicks" Mobility   Outcome Measure  Help needed turning from your back to your side while in a flat bed without using bedrails?: None Help needed moving from lying on your back to sitting on the side of a flat bed without using bedrails?: A Little Help needed moving to and from a bed to a chair (including a wheelchair)?: A Little Help needed standing up from a chair using your arms (e.g., wheelchair or bedside chair)?: A Little Help needed to walk in hospital room?: A Little Help  needed climbing 3-5 steps with a railing? : A Lot 6 Click Score: 18    End of Session Equipment Utilized During Treatment: Gait belt Activity Tolerance: Patient tolerated treatment well Patient left: in chair;with chair alarm set;with call bell/phone within reach Nurse Communication: Mobility status PT Visit Diagnosis: Unsteadiness on feet (R26.81);Muscle weakness (generalized) (M62.81);Difficulty in walking, not elsewhere classified (R26.2)     Time: 1027-1050 PT Time Calculation (min) (ACUTE ONLY): 23 min  Charges:  $Gait Training: 8-22 mins $Therapeutic Exercise: 8-22 mins                      , PT, GCS 09/28/19,11:04 AM

## 2019-09-28 NOTE — Care Management Important Message (Signed)
Important Message  Patient Details  Name: Douglas Peterson. MRN: [id number] Date of Birth: 1951/03/14   Medicare Important Message Given:  Yes     Juliann Pulse A  09/28/2019, 10:56 AM

## 2019-09-28 NOTE — Progress Notes (Signed)
PROGRESS NOTE    Douglas Peterson.  LOV:[id number] DOB: 04/04/1951 DOA: 09/20/2019 PCP: [name], MD   Brief Narrative:  [name] a 68 year old male with past medical history of essential hypertension and alcoholism, was had multiple hospitalizations for alcohol withdrawals or trying to detox.  According to social work, is also had difficult placement issues because he will go to a skilled nursing facility check himself out and then drink some more.  Patient presented on 11/17 for confusion and was found to have alcohol induced delirium.  The rest of his work-up was unrevealing.  He was felt to have thiamine deficiency and started receiving supplementation for this.  His mentation is actually cleared up he is currently alert and oriented.  He is found to be however extremely deconditioned and malnourished.  Attempts to find a skilled nursing facility take him have been quite limited.  Subjective: Patient was feeling better when seen this morning.  No new complaints.  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  Patient was much calmer today.  Not requiring any Ativan. -Continue with thiamine.  Hyponatremia.  Resolved.  Hypertension.  Normotensive today. -Continue with Tenormin-it was restarted yesterday after initial hold due to softer blood pressure.  Protein caloric malnutrition/Physical deconditioning.  Patient appears very cachectic and malnourished. -Consult to nutritionist. -Continue PT-commending SNF placement.  Dysphagia.  Seen by speech therapy and he was placed on dysphagia 1 diet. Patient has an history of traumatic brain injury as told by himself today.  He has also having contractures of right upper extremity.  Strengths seems  close to normal.  Alcoholic thrombocytopenia.  Improving platelet above 150 now.  Objective: Vitals:   09/27/19 0830 09/27/19 1437 09/27/19 2001 09/28/19 0442  BP: (!) 142/92 (!) 138/91 (!) 129/91 126/78  Pulse: 74 86 87 71  Resp: 17 14 15 17   Temp: 98.2 F (36.8 C) 98.7 F (37.1 C) 97.9 F (36.6 C) (!) 97.3 F (36.3 C)  TempSrc: Oral Oral Oral Oral  SpO2: 97% 97% 95% 98%  Weight:      Height:        Intake/Output Summary (Last 24 hours) at 09/28/2019 1301 Last data filed at 09/28/2019 0844 Gross per 24 hour  Intake 120 ml  Output 200 ml  Net -80 ml   Filed Weights   09/20/19 1756 09/20/19 1803 09/24/19 0500  Weight: 58.1 kg 53.5 kg 49.9 kg    Examination:  General exam: Cachectic and emaciated gentleman, appears calm and comfortable  Respiratory system: Clear to auscultation. Respiratory effort normal. Cardiovascular system: S1 & S2 heard, RRR. No JVD, murmurs, rubs, gallops or clicks. No pedal edema. Gastrointestinal system: Abdomen is nondistended, soft and nontender. No organomegaly or masses felt. Normal bowel sounds heard. Central nervous system: Alert and oriented. No focal neurological deficits. Extremities: Right upper extremity contractures.  Strength 4/5 in right upper extremity, rest 5/5 bilaterally. Psychiatry: Judgement and insight appear normal. Mood & affect appropriate.   DVT prophylaxis: Lovenox-started today as platelet has been improved. Code Status: Full Family Communication: No family at bedside. Disposition Plan: SNF once bed becomes available.  Medically stable.  Consultants:   None  Data Reviewed: I have personally reviewed following labs and imaging studies  CBC: Recent Labs  Lab  09/23/19 0520 09/24/19 0155 09/26/19 0439 09/27/19 0427  WBC 6.8 6.6 6.4 7.1  NEUTROABS 3.9  --   --   --   HGB 14.4 13.7 13.1 14.2  HCT 40.7 39.6 36.7* 40.0  MCV 91.1 94.7 89.5 89.5  PLT 75* 86* 117* 676   Basic Metabolic Panel: Recent Labs  Lab  09/22/19 0419 09/23/19 0520 09/23/19 1452 09/24/19 0155 09/26/19 0439 09/27/19 0427  NA  --  143 142 143 138 140  K  --  2.9* 3.2* 3.7 3.7 3.9  CL  --  101 104 108 105 103  CO2  --  26 27 25 24 26   GLUCOSE  --  89 111* 150* 94 96  BUN  --  19 19 19 13 19   CREATININE  --  0.72 0.61 0.54* 0.49* 0.46*  CALCIUM  --  8.9 8.9 9.6 8.9 9.4  MG  --  2.3 2.3 2.0  --   --   PHOS 3.9  --   --  2.6  --   --    GFR: Estimated Creatinine Clearance: 62.4 mL/min (A) (by C-G formula based on SCr of 0.46 mg/dL (L)). Liver Function Tests: Recent Labs  Lab 09/23/19 0520 09/24/19 0155  AST 131* 103*  ALT 78* 83*  ALKPHOS 84 115  BILITOT 1.3* 1.1  PROT 7.8 7.2  ALBUMIN 4.3 4.0   No results for input(s): LIPASE, AMYLASE in the last 168 hours. No results for input(s): AMMONIA in the last 168 hours. Coagulation Profile: No results for input(s): INR, PROTIME in the last 168 hours. Cardiac Enzymes: No results for input(s): CKTOTAL, CKMB, CKMBINDEX, TROPONINI in the last 168 hours. BNP (last 3 results) No results for input(s): PROBNP in the last 8760 hours. HbA1C: No results for input(s): HGBA1C in the last 72 hours. CBG: Recent Labs  Lab 09/22/19 0351 09/22/19 2112 09/23/19 0735 09/23/19 1121  GLUCAP 137* 77 80 80   Lipid Profile: No results for input(s): CHOL, HDL, LDLCALC, TRIG, CHOLHDL, LDLDIRECT in the last 72 hours. Thyroid Function Tests: No results for input(s): TSH, T4TOTAL, FREET4, T3FREE, THYROIDAB in the last 72 hours. Anemia Panel: No results for input(s): VITAMINB12, FOLATE, FERRITIN, TIBC, IRON, RETICCTPCT in the last 72 hours. Sepsis Labs: No results for input(s): PROCALCITON, LATICACIDVEN in the last 168 hours.  Recent Results (from the past 240 hour(s))  SARS CORONAVIRUS 2 (TAT 6-24 HRS) Nasopharyngeal Nasopharyngeal Swab     Status: None   Collection Time: 09/21/19  1:02 AM   Specimen: Nasopharyngeal Swab  Result Value Ref Range Status   SARS Coronavirus 2  NEGATIVE NEGATIVE Final    Comment: (NOTE) SARS-CoV-2 target nucleic acids are NOT DETECTED. The SARS-CoV-2 RNA is generally detectable in upper and lower respiratory specimens during the acute phase of infection. Negative results do not preclude SARS-CoV-2 infection, do not rule out co-infections with other pathogens, and should not be used as the sole basis for treatment or other patient management decisions. Negative results must be combined with clinical observations, patient history, and epidemiological information. The expected result is Negative. Fact Sheet for Patients: SugarRoll.be Fact Sheet for Healthcare Providers: https://www.woods-mathews.com/ This test is not yet approved or cleared by the [place] FDA and  has been authorized for detection and/or diagnosis of SARS-CoV-2 by FDA under an Emergency Use Authorization (EUA). This EUA will remain  in effect (meaning this test can be used) for the duration of the COVID-19 declaration under Section 56 4(b)(1) of the Act, 21 U.S.C.  section 360bbb-3(b)(1), unless the authorization is terminated or revoked sooner. Performed at [facility], [place] [address] [place], [place] [address]   MRSA PCR Screening     Status: None   Collection Time: 09/22/19  4:09 AM   Specimen: Nasopharyngeal  Result Value Ref Range Status   MRSA by PCR NEGATIVE NEGATIVE Final    Comment:        The GeneXpert MRSA Assay (FDA approved for NASAL specimens only), is one component of a comprehensive MRSA colonization surveillance program. It is not intended to diagnose MRSA infection nor to guide or monitor treatment for MRSA infections. Performed at [facility], [address], [place], [place] [address]      Radiology Studies: No results found.  Scheduled Meds: . aspirin EC  81 mg Oral Daily  . atenolol  25 mg Oral QPM  . Chlorhexidine Gluconate Cloth  6 each Topical Q0600  .  feeding supplement (ENSURE ENLIVE)  237 mL Oral BID BM  . multivitamin with minerals  1 tablet Oral Daily  . PARoxetine  30 mg Oral Daily  . thiamine  100 mg Oral Daily  . vitamin C  1,000 mg Oral Daily   Continuous Infusions:   LOS: 7 days   Time spent: 40 minutes.  Lorella Nimrod, MD [facility] Pager [phone]  If 7PM-7AM, please contact night-coverage [email or web address] Password [facility] 09/28/2019, 1:01 PM   This record has been created using Systems analyst. Errors have been sought and corrected,but may not always be located. Such creation errors do not reflect on the standard of care.

## 2019-09-28 NOTE — TOC Progression Note (Signed)
Transition of Care (TOC) - Progression Note    Patient Details  Name: Mitsuo Budnick. MRN: [id number] Date of Birth: [birth year]  Transition of [facility]) CM/SW Contact  Shelbie Hutching, RN Phone Number: 09/28/2019, 4:02 PM  Clinical Narrative:     [facility] offered a bed, patient accepted.  Insurance authorization has been started.   Expected Discharge Plan: [place] Barriers to Discharge: Continued Medical Work up  Expected Discharge Plan and Services Expected Discharge Plan: [place]   Discharge Planning Services: CM Consult   Living arrangements for the past 2 months: Single Family Home                                       Social Determinants of Health (SDOH) Interventions    Readmission Risk Interventions No flowsheet data found.

## 2019-09-29 LAB — CBC
HCT: 42.6 % (ref 39.0–52.0)
Hemoglobin: 15.3 g/dL (ref 13.0–17.0)
MCH: 31.9 pg (ref 26.0–34.0)
MCHC: 35.9 g/dL (ref 30.0–36.0)
MCV: 88.8 fL (ref 80.0–100.0)
Platelets: 254 10*3/uL (ref 150–400)
RBC: 4.8 MIL/uL (ref 4.22–5.81)
RDW: 12.5 % (ref 11.5–15.5)
WBC: 8.3 10*3/uL (ref 4.0–10.5)
nRBC: 0 % (ref 0.0–0.2)

## 2019-09-29 LAB — RENAL FUNCTION PANEL
Albumin: 4 g/dL (ref 3.5–5.0)
Anion gap: 11 (ref 5–15)
BUN: 20 mg/dL (ref 8–23)
CO2: 24 mmol/L (ref 22–32)
Calcium: 9.4 mg/dL (ref 8.9–10.3)
Chloride: 104 mmol/L (ref 98–111)
Creatinine, Ser: 0.5 mg/dL — ABNORMAL LOW (ref 0.61–1.24)
GFR calc Af Amer: >60 mL/min (ref >60)
GFR calc non Af Amer: >60 mL/min (ref >60)
Glucose, Bld: 90 mg/dL (ref 70–99)
Phosphorus: 4.2 mg/dL (ref 2.5–4.6)
Potassium: 4.7 mmol/L (ref 3.5–5.1)
Sodium: 139 mmol/L (ref 135–145)

## 2019-09-29 NOTE — Progress Notes (Signed)
PROGRESS NOTE    Douglas Peterson.  RJJ:[id number] DOB: 10-Jan-1951 DOA: 09/20/2019 PCP: [name], MD   Brief Narrative:  [name] a 68 year old male with past medical history of essential hypertension and alcoholism, was had multiple hospitalizations for alcohol withdrawals or trying to detox.  According to social work, is also had difficult placement issues because he will go to a skilled nursing facility check himself out and then drink some more.  Patient presented on 11/17 for confusion and was found to have alcohol induced delirium.  The rest of his work-up was unrevealing.  He was felt to have thiamine deficiency and started receiving supplementation for this.  His mentation is actually cleared up he is currently alert and oriented.  He is found to be however extremely deconditioned and malnourished. Patient was offered a bed at a skilled nursing facility, pending insurance improvement.  Subjective: Patient was feeling better when seen this morning.  No new complaints.  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  Patient was much calmer.  Not requiring any Ativan. -Continue with thiamine.  Hyponatremia.  Resolved.  Hypertension.  Normotensive today. -Continue with Tenormin.  Protein caloric malnutrition/Physical deconditioning.  Patient appears very cachectic and malnourished. -Consult to nutritionist. -Continue PT.  Dysphagia.  Seen by speech therapy and he was placed on dysphagia 1 diet. Patient has an history of traumatic brain injury as told by himself today.  He has also having contractures of right upper extremity.  Strengths seems close to normal.  Alcoholic  thrombocytopenia.  Improving platelet above 150 now.  Objective: Vitals:   09/28/19 1542 09/28/19 1941 09/28/19 2352 09/29/19 0727  BP: (!) 144/89 (!) 146/76 132/75 127/84  Pulse: 98 90 75 66  Resp: 18 18 16 15   Temp: 98.3 F (36.8 C) 97.7 F (36.5 C) 98.2 F (36.8 C) 97.8 F (36.6 C)  TempSrc: Oral Oral Oral Oral  SpO2: 97% 95% 96% 96%  Weight:      Height:        Intake/Output Summary (Last 24 hours) at 09/29/2019 1315 Last data filed at 09/29/2019 1114 Gross per 24 hour  Intake 360 ml  Output 625 ml  Net -265 ml   Filed Weights   09/20/19 1756 09/20/19 1803 09/24/19 0500  Weight: 58.1 kg 53.5 kg 49.9 kg    Examination:  General exam: Cachectic and emaciated gentleman, appears calm and comfortable  Respiratory system: Clear to auscultation. Respiratory effort normal. Cardiovascular system: S1 & S2 heard, RRR. No JVD, murmurs, rubs, gallops or clicks. No pedal edema. Gastrointestinal system: Abdomen is nondistended, soft and nontender. No organomegaly or masses felt. Normal bowel sounds heard. Central nervous system: Alert and oriented. No focal neurological deficits. Extremities: Right upper extremity contractures.  Strength 4/5 in right upper extremity, rest 5/5 bilaterally. Psychiatry: Judgement and insight appear normal. Mood & affect appropriate.   DVT prophylaxis: Lovenox- Code Status: Full Family Communication: No family at bedside. Disposition Plan: SNF , pending insurance improvement.  Consultants:   None  Data Reviewed: I have personally reviewed following labs and imaging studies  CBC: Recent Labs  Lab 09/23/19 0520 09/24/19 0155 09/26/19 0439 09/27/19 0427 09/29/19 0433  WBC 6.8 6.6 6.4 7.1 8.3  NEUTROABS  3.9  --   --   --   --   HGB 14.4 13.7 13.1 14.2 15.3  HCT 40.7 39.6 36.7* 40.0 42.6  MCV 91.1 94.7 89.5 89.5 88.8  PLT 75* 86* 117* 152 147   Basic Metabolic Panel: Recent Labs  Lab 09/23/19 0520 09/23/19 1452 09/24/19 0155  09/26/19 0439 09/27/19 0427 09/29/19 0433  NA 143 142 143 138 140 139  K 2.9* 3.2* 3.7 3.7 3.9 4.7  CL 101 104 108 105 103 104  CO2 26 27 25 24 26 24   GLUCOSE 89 111* 150* 94 96 90  BUN 19 19 19 13 19 20   CREATININE 0.72 0.61 0.54* 0.49* 0.46* 0.50*  CALCIUM 8.9 8.9 9.6 8.9 9.4 9.4  MG 2.3 2.3 2.0  --   --   --   PHOS  --   --  2.6  --   --  4.2   GFR: Estimated Creatinine Clearance: 62.4 mL/min (A) (by C-G formula based on SCr of 0.5 mg/dL (L)). Liver Function Tests: Recent Labs  Lab 09/23/19 0520 09/24/19 0155 09/29/19 0433  AST 131* 103*  --   ALT 78* 83*  --   ALKPHOS 84 115  --   BILITOT 1.3* 1.1  --   PROT 7.8 7.2  --   ALBUMIN 4.3 4.0 4.0   No results for input(s): LIPASE, AMYLASE in the last 168 hours. No results for input(s): AMMONIA in the last 168 hours. Coagulation Profile: No results for input(s): INR, PROTIME in the last 168 hours. Cardiac Enzymes: No results for input(s): CKTOTAL, CKMB, CKMBINDEX, TROPONINI in the last 168 hours. BNP (last 3 results) No results for input(s): PROBNP in the last 8760 hours. HbA1C: No results for input(s): HGBA1C in the last 72 hours. CBG: Recent Labs  Lab 09/22/19 2112 09/23/19 0735 09/23/19 1121  GLUCAP 77 80 80   Lipid Profile: No results for input(s): CHOL, HDL, LDLCALC, TRIG, CHOLHDL, LDLDIRECT in the last 72 hours. Thyroid Function Tests: No results for input(s): TSH, T4TOTAL, FREET4, T3FREE, THYROIDAB in the last 72 hours. Anemia Panel: No results for input(s): VITAMINB12, FOLATE, FERRITIN, TIBC, IRON, RETICCTPCT in the last 72 hours. Sepsis Labs: No results for input(s): PROCALCITON, LATICACIDVEN in the last 168 hours.  Recent Results (from the past 240 hour(s))  SARS CORONAVIRUS 2 (TAT 6-24 HRS) Nasopharyngeal Nasopharyngeal Swab     Status: None   Collection Time: 09/21/19  1:02 AM   Specimen: Nasopharyngeal Swab  Result Value Ref Range Status   SARS Coronavirus 2 NEGATIVE NEGATIVE Final    Comment:  (NOTE) SARS-CoV-2 target nucleic acids are NOT DETECTED. The SARS-CoV-2 RNA is generally detectable in upper and lower respiratory specimens during the acute phase of infection. Negative results do not preclude SARS-CoV-2 infection, do not rule out co-infections with other pathogens, and should not be used as the sole basis for treatment or other patient management decisions. Negative results must be combined with clinical observations, patient history, and epidemiological information. The expected result is Negative. Fact Sheet for Patients: SugarRoll.be Fact Sheet for Healthcare Providers: https://www.woods-mathews.com/ This test is not yet approved or cleared by the [place] FDA and  has been authorized for detection and/or diagnosis of SARS-CoV-2 by FDA under an Emergency Use Authorization (EUA). This EUA will remain  in effect (meaning this test can be used) for the duration of the COVID-19 declaration under Section 56 4(b)(1) of the Act, 21 U.S.C. section 360bbb-3(b)(1), unless the authorization is terminated or revoked sooner.  Performed at [facility], [place] [address], [place], [place] [address]   MRSA PCR Screening     Status: None   Collection Time: 09/22/19  4:09 AM   Specimen: Nasopharyngeal  Result Value Ref Range Status   MRSA by PCR NEGATIVE NEGATIVE Final    Comment:        The GeneXpert MRSA Assay (FDA approved for NASAL specimens only), is one component of a comprehensive MRSA colonization surveillance program. It is not intended to diagnose MRSA infection nor to guide or monitor treatment for MRSA infections. Performed at [facility], [address], [place], [place] [address]      Radiology Studies: No results found.  Scheduled Meds: . aspirin EC  81 mg Oral Daily  . atenolol  25 mg Oral QPM  . Chlorhexidine Gluconate Cloth  6 each Topical Q0600  . enoxaparin (LOVENOX) injection  40 mg  Subcutaneous Q24H  . multivitamin with minerals  1 tablet Oral Daily  . PARoxetine  30 mg Oral Daily  . thiamine  100 mg Oral Daily  . vitamin C  1,000 mg Oral Daily   Continuous Infusions:   LOS: 8 days   Time spent: 30 minutes.  Lorella Nimrod, MD [facility] Pager [phone]  If 7PM-7AM, please contact night-coverage [email or web address] Password [facility] 09/29/2019, 1:15 PM   This record has been created using Systems analyst. Errors have been sought and corrected,but may not always be located. Such creation errors do not reflect on the standard of care.

## 2019-09-30 DIAGNOSIS — F1010 Alcohol abuse, uncomplicated: Secondary | ICD-10-CM

## 2019-09-30 DIAGNOSIS — F10231 Alcohol dependence with withdrawal delirium: Principal | ICD-10-CM

## 2019-09-30 LAB — SARS CORONAVIRUS 2 (TAT 6-24 HRS): SARS Coronavirus 2: NEGATIVE

## 2019-09-30 MED ORDER — BACLOFEN 10 MG PO TABS
10.0000 mg | ORAL_TABLET | Freq: Three times a day (TID) | ORAL | Status: DC
  Administered 2019-09-30 – 2019-10-03 (×10): 10 mg via ORAL
  Filled 2019-09-30 (×13): qty 1

## 2019-09-30 NOTE — Progress Notes (Signed)
PROGRESS NOTE    Douglas Peterson.  OZH:[id number] DOB: 07/08/1951 DOA: 09/20/2019 PCP: [name], MD   Brief Narrative:  [name] a 68 year old male with past medical history of essential hypertension and alcoholism, was had multiple hospitalizations for alcohol withdrawals or trying to detox.  According to social work, is also had difficult placement issues because he will go to a skilled nursing facility check himself out and then drink some more.  Patient presented on 11/17 for confusion and was found to have alcohol induced delirium.  The rest of his work-up was unrevealing.  He was felt to have thiamine deficiency and started receiving supplementation for this.  His mentation is actually cleared up he is currently alert and oriented.  He is found to be however extremely deconditioned and malnourished. Patient was offered a bed at a skilled nursing facility, pending insurance improvement.  Subjective: Patient was feeling better when seen this morning.  No new complaints.  Assessment & Plan:   Active Problems:   Hyponatremia   HTN (hypertension)   Malnutrition of moderate degree   Alcohol withdrawal delirium, acute, hyperactive (HCC)   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[place])   Hypokalemia   Dysphagia   Alcoholic intoxication with complication ([place])  Alcohol withdrawal delirium.  Patient was much calmer.  Not requiring any Ativan. -Continue with thiamine.  Hyponatremia.  Resolved.  Hypertension.  Normotensive today. -Continue with Tenormin.  Protein caloric malnutrition/Physical deconditioning.  Patient appears very cachectic and malnourished. -Consult to nutritionist. -Continue PT.  Dysphagia.  Seen by speech therapy and he was placed on dysphagia 1 diet. Patient has an history of traumatic brain injury as told by himself today.  He has also having contractures of right upper extremity.  Strengths seems close to normal.  Alcoholic  thrombocytopenia.  Improving platelet above 150 now.  Objective: Vitals:   09/29/19 1914 09/29/19 2006 09/29/19 2356 09/30/19 0748  BP: (!) 150/92 (!) 142/90 129/80 130/82  Pulse: 81 80 62 67  Resp:   17 18  Temp:  97.9 F (36.6 C) 97.9 F (36.6 C) 97.7 F (36.5 C)  TempSrc:  Oral Oral Oral  SpO2:  95% 96% 96%  Weight:      Height:        Intake/Output Summary (Last 24 hours) at 09/30/2019 1604 Last data filed at 09/30/2019 1356 Gross per 24 hour  Intake 120 ml  Output 1200 ml  Net -1080 ml   Filed Weights   09/20/19 1756 09/20/19 1803 09/24/19 0500  Weight: 58.1 kg 53.5 kg 49.9 kg    Examination:  General exam: Cachectic and emaciated gentleman, appears calm and comfortable  Respiratory system: Clear to auscultation. Respiratory effort normal. Cardiovascular system: S1 & S2 heard, RRR. No JVD, murmurs, rubs, gallops or clicks. No pedal edema. Gastrointestinal system: Abdomen is nondistended, soft and nontender. No organomegaly or masses felt. Normal bowel sounds heard. Central nervous system: Alert and oriented. No focal neurological deficits. Extremities: Right upper extremity contractures.  Strength 4/5 in right upper extremity, rest 5/5 bilaterally. Psychiatry: Judgement and insight appear normal. Mood & affect appropriate.   DVT prophylaxis: Lovenox- Code Status: Full Family Communication: No family at bedside. Disposition Plan: SNF , pending insurance improvement.  Consultants:   None  Data Reviewed: I have personally reviewed following labs and imaging studies  CBC: Recent Labs  Lab 09/24/19 0155 09/26/19 0439 09/27/19 0427 09/29/19 0433  WBC 6.6 6.4 7.1 8.3  HGB 13.7 13.1 14.2 15.3  HCT  39.6 36.7* 40.0 42.6  MCV 94.7 89.5 89.5 88.8  PLT 86* 117* 152 878   Basic Metabolic Panel: Recent Labs  Lab 09/24/19 0155 09/26/19 0439 09/27/19 0427 09/29/19 0433  NA 143 138 140 139  K 3.7 3.7 3.9 4.7  CL 108 105 103 104  CO2 25 24 26 24   GLUCOSE  150* 94 96 90  BUN 19 13 19 20   CREATININE 0.54* 0.49* 0.46* 0.50*  CALCIUM 9.6 8.9 9.4 9.4  MG 2.0  --   --   --   PHOS 2.6  --   --  4.2   GFR: Estimated Creatinine Clearance: 62.4 mL/min (A) (by C-G formula based on SCr of 0.5 mg/dL (L)). Liver Function Tests: Recent Labs  Lab 09/24/19 0155 09/29/19 0433  AST 103*  --   ALT 83*  --   ALKPHOS 115  --   BILITOT 1.1  --   PROT 7.2  --   ALBUMIN 4.0 4.0   No results for input(s): LIPASE, AMYLASE in the last 168 hours. No results for input(s): AMMONIA in the last 168 hours. Coagulation Profile: No results for input(s): INR, PROTIME in the last 168 hours. Cardiac Enzymes: No results for input(s): CKTOTAL, CKMB, CKMBINDEX, TROPONINI in the last 168 hours. BNP (last 3 results) No results for input(s): PROBNP in the last 8760 hours. HbA1C: No results for input(s): HGBA1C in the last 72 hours. CBG: No results for input(s): GLUCAP in the last 168 hours. Lipid Profile: No results for input(s): CHOL, HDL, LDLCALC, TRIG, CHOLHDL, LDLDIRECT in the last 72 hours. Thyroid Function Tests: No results for input(s): TSH, T4TOTAL, FREET4, T3FREE, THYROIDAB in the last 72 hours. Anemia Panel: No results for input(s): VITAMINB12, FOLATE, FERRITIN, TIBC, IRON, RETICCTPCT in the last 72 hours. Sepsis Labs: No results for input(s): PROCALCITON, LATICACIDVEN in the last 168 hours.  Recent Results (from the past 240 hour(s))  SARS CORONAVIRUS 2 (TAT 6-24 HRS) Nasopharyngeal Nasopharyngeal Swab     Status: None   Collection Time: 09/21/19  1:02 AM   Specimen: Nasopharyngeal Swab  Result Value Ref Range Status   SARS Coronavirus 2 NEGATIVE NEGATIVE Final    Comment: (NOTE) SARS-CoV-2 target nucleic acids are NOT DETECTED. The SARS-CoV-2 RNA is generally detectable in upper and lower respiratory specimens during the acute phase of infection. Negative results do not preclude SARS-CoV-2 infection, do not rule out co-infections with other  pathogens, and should not be used as the sole basis for treatment or other patient management decisions. Negative results must be combined with clinical observations, patient history, and epidemiological information. The expected result is Negative. Fact Sheet for Patients: SugarRoll.be Fact Sheet for Healthcare Providers: https://www.woods-mathews.com/ This test is not yet approved or cleared by the [place] FDA and  has been authorized for detection and/or diagnosis of SARS-CoV-2 by FDA under an Emergency Use Authorization (EUA). This EUA will remain  in effect (meaning this test can be used) for the duration of the COVID-19 declaration under Section 56 4(b)(1) of the Act, 21 U.S.C. section 360bbb-3(b)(1), unless the authorization is terminated or revoked sooner. Performed at [facility], [place] [address] [place], [place] [address]   MRSA PCR Screening     Status: None   Collection Time: 09/22/19  4:09 AM   Specimen: Nasopharyngeal  Result Value Ref Range Status   MRSA by PCR NEGATIVE NEGATIVE Final    Comment:        The GeneXpert MRSA Assay (FDA approved for  NASAL specimens only), is one component of a comprehensive MRSA colonization surveillance program. It is not intended to diagnose MRSA infection nor to guide or monitor treatment for MRSA infections. Performed at [facility], [place], [place] [address]   SARS CORONAVIRUS 2 (TAT 6-24 HRS) Nasopharyngeal Nasopharyngeal Swab     Status: None   Collection Time: 09/29/19  7:22 PM   Specimen: Nasopharyngeal Swab  Result Value Ref Range Status   SARS Coronavirus 2 NEGATIVE NEGATIVE Final    Comment: (NOTE) SARS-CoV-2 target nucleic acids are NOT DETECTED. The SARS-CoV-2 RNA is generally detectable in upper and lower respiratory specimens during the acute phase of infection. Negative results do not preclude SARS-CoV-2 infection, do not rule out  co-infections with other pathogens, and should not be used as the sole basis for treatment or other patient management decisions. Negative results must be combined with clinical observations, patient history, and epidemiological information. The expected result is Negative. Fact Sheet for Patients: SugarRoll.be Fact Sheet for Healthcare Providers: https://www.woods-mathews.com/ This test is not yet approved or cleared by the [place] FDA and  has been authorized for detection and/or diagnosis of SARS-CoV-2 by FDA under an Emergency Use Authorization (EUA). This EUA will remain  in effect (meaning this test can be used) for the duration of the COVID-19 declaration under Section 56 4(b)(1) of the Act, 21 U.S.C. section 360bbb-3(b)(1), unless the authorization is terminated or revoked sooner. Performed at [facility], [place] [address] [place], [place] [address]      Radiology Studies: No results found.  Scheduled Meds: . aspirin EC  81 mg Oral Daily  . atenolol  25 mg Oral QPM  . baclofen  10 mg Oral TID  . Chlorhexidine Gluconate Cloth  6 each Topical Q0600  . enoxaparin (LOVENOX) injection  40 mg Subcutaneous Q24H  . multivitamin with minerals  1 tablet Oral Daily  . PARoxetine  30 mg Oral Daily  . thiamine  100 mg Oral Daily  . vitamin C  1,000 mg Oral Daily   Continuous Infusions:   LOS: 9 days   Time spent: 20 minutes.  Lorella Nimrod, MD [facility] Pager [phone]  If 7PM-7AM, please contact night-coverage [email or web address] Password [facility] 09/30/2019, 4:04 PM   This record has been created using Systems analyst. Errors have been sought and corrected,but may not always be located. Such creation errors do not reflect on the standard of care.

## 2019-09-30 NOTE — Progress Notes (Signed)
Physical Therapy Treatment Patient Details Name: Douglas Peterson. MRN: [id number] DOB: 09-22-1951 Today's Date: 09/30/2019    History of Present Illness 68 yo male presented to ED with c/o of difficulty walking and requesting help with detox, pt had been drinking to treat his nausea, stated he has had several falls and has difficulty walking due to past hx of TBI with residual R sided weakness, PMH includes alcohol abuse, HTN, hyponatremia    PT Comments    Patient ready for PT. Improved independence with mobility this visit. Performed bed mobility without external support, mod independence with use of rails. Patient performed transfer with supervision. Attempted ambulation with hemi-walker initially, patient was unsteady. Switched to RW and had improved balance. He was able to walk 400 feet with RW and min guard assist. LE strengthening exercises at end of session. Patient will continue to benefit from skilled PT while here to improve safety with mobility and activity tolerance.      Follow Up Recommendations  SNF     Equipment Recommendations  None recommended by PT    Recommendations for Other Services       Precautions / Restrictions Precautions Precautions: Fall Restrictions Weight Bearing Restrictions: No    Mobility  Bed Mobility Overal bed mobility: Modified Independent Bed Mobility: Supine to Sit     Supine to sit: Modified independent (Device/Increase time)     General bed mobility comments: Use of bed rail  Transfers Overall transfer level: Needs assistance Equipment used: Hemi-walker Transfers: Sit to/from Stand Sit to Stand: Supervision            Ambulation/Gait Ambulation/Gait assistance: Min guard Gait Distance (Feet): 400 Feet Assistive device: Rolling walker (2 wheeled) Gait Pattern/deviations: Decreased step length - right Gait velocity: decreased   General Gait Details: no significant fatigue with this distance.   Stairs             Wheelchair Mobility    Modified Rankin (Stroke Patients Only)       Balance Overall balance assessment: Modified Independent Sitting-balance support: Feet supported Sitting balance-Leahy Scale: Good     Standing balance support: Bilateral upper extremity supported;During functional activity Standing balance-Leahy Scale: Fair Standing balance comment: Reliant on external support                            Cognition Arousal/Alertness: Awake/alert Behavior During Therapy: WFL for tasks assessed/performed Overall Cognitive Status: Within Functional Limits for tasks assessed                                        Exercises Other Exercises Other Exercises: B LE exercises: ap, hip abd/add, LAQ, marching, SLR x 10 reps    General Comments        Pertinent Vitals/Pain Pain Assessment: No/denies pain    Home Living                      Prior Function            PT Goals (current goals can now be found in the care plan section) Acute Rehab PT Goals Patient Stated Goal: go home PT Goal Formulation: With patient Time For Goal Achievement: 10/09/19 Potential to Achieve Goals: Fair Progress towards PT goals: Progressing toward goals    Frequency    Min 2X/week      PT  Plan Current plan remains appropriate    Co-evaluation              AM-PAC PT "6 Clicks" Mobility   Outcome Measure  Help needed turning from your back to your side while in a flat bed without using bedrails?: None Help needed moving from lying on your back to sitting on the side of a flat bed without using bedrails?: None Help needed moving to and from a bed to a chair (including a wheelchair)?: A Little Help needed standing up from a chair using your arms (e.g., wheelchair or bedside chair)?: A Little Help needed to walk in hospital room?: A Little Help needed climbing 3-5 steps with a railing? : A Lot 6 Click Score: 19    End of Session  Equipment Utilized During Treatment: Gait belt Activity Tolerance: Patient tolerated treatment well Patient left: in chair;with chair alarm set;with call bell/phone within reach Nurse Communication: Mobility status PT Visit Diagnosis: Unsteadiness on feet (R26.81);Muscle weakness (generalized) (M62.81)     Time: 9937-1696 PT Time Calculation (min) (ACUTE ONLY): 23 min  Charges:  $Gait Training: 8-22 mins $Therapeutic Exercise: 8-22 mins                      , PT, [facility] 09/30/19,11:16 AM

## 2019-09-30 NOTE — Care Management Important Message (Signed)
Important Message  Patient Details  Name: Douglas Peterson. MRN: [id number] Date of Birth: December 30, 1950   Medicare Important Message Given:  Yes     [name] 09/30/2019, 11:01 AM

## 2019-09-30 NOTE — Progress Notes (Signed)
Contacted Tammy with [facility] [employer] to f/u with insurance approval for SNF. Left a VM. Will continue to f/u.

## 2019-09-30 NOTE — Progress Notes (Signed)
Contacted Tammy with [facility] to f/u with insurance coverage for SNF. Tammy reports that she will check and will f/u with CM.

## 2019-10-01 MED ORDER — ONDANSETRON 4 MG PO TBDP
4.0000 mg | ORAL_TABLET | Freq: Three times a day (TID) | ORAL | Status: DC | PRN
  Filled 2019-10-01: qty 1

## 2019-10-01 NOTE — Discharge Summary (Signed)
Physician Discharge Summary  Douglas Peterson. TAV:[id number] DOB: 10-31-1951 DOA: 09/20/2019  PCP: [name], MD  Admit date: 09/20/2019 Discharge date: 10/03/2019  Admitted From: Home Disposition: SNF  Recommendations for Outpatient Follow-up:  1. Follow up with PCP in 1-2 weeks 2. Please obtain BMP/CBC in one week 3. Please follow up on the following pending results:None  Home Health: No Equipment/Devices: Walker Discharge Condition: Stable CODE STATUS: Full Diet recommendation: Heart Healthy / Carb Modified / Regular / Dysphagia   Brief/Interim Summary: [name] a 68 year old male with past medical history of essential hypertension and alcoholism, was had multiple hospitalizations for alcohol withdrawals or trying to detox.  He was presented again on 11/17 with confusion and found to have alcohol induced delirium.  Rest of the work up was unrevealing.  He was treated initially with CIWA protocol with Ativan.  Also received thiamine supplement.  His mentation cleared up gradually.  Patient was extremely deconditioned and malnourished and cannot take care of himself.  He was initially experiencing dysphagia and was placed on dysphagia diet, later started eating regular.  Patient has an history of traumatic brain injury and having mildly decreased strength in right upper extremity.  Have developed contractures.  He should get benefit with continuation of physical therapy and baclofen.  Patient was also found to have thrombocytopenia which was thought to be due to alcohol.  He was advised complete cessation of alcohol.  His platelets improved during his stay to about 150.  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   atenolol 25 MG tablet Commonly known as: TENORMIN Take 25 mg by mouth every evening.   baclofen 10 MG tablet Commonly known as: LIORESAL Take 10 mg by mouth 3 (three) times daily.   CALCIUM 1200 PO Take 1 tablet by mouth daily.   doxepin 10 MG capsule Commonly known as: SINEQUAN Take 10 mg by mouth at bedtime as needed for sleep.   feeding supplement (ENSURE ENLIVE) Liqd Take 237 mLs by mouth 2 (two) times daily between meals.   folic acid 553 MCG tablet Commonly known as: FOLVITE Take 400 mcg by mouth daily.   hydrOXYzine 25 MG tablet Commonly known as: ATARAX/VISTARIL Take 25 mg by mouth 3 (three) times daily as needed.   multivitamin with minerals Tabs tablet Take 1 tablet by mouth daily.   Nat-Rul Vitamin D 125 MCG (5000 UT) Tabs Generic drug: Cholecalciferol Take 5,000 Units by mouth daily.   PARoxetine 30 MG tablet Commonly known as: PAXIL Take 30 mg by mouth daily.   thiamine 100 MG tablet Take 1 tablet (100 mg total) by mouth daily.   vitamin C 500 MG tablet Commonly known as: ASCORBIC  ACID Take 1,000 mg by mouth daily.      Contact information for after-discharge care    Destination    [facility] Preferred SNF .   Service: Skilled Nursing Contact information: [address] [place] [phone]             No Known Allergies  Consultations:  None  Procedures/Studies: Ct Head Wo Contrast  Result Date: 09/20/2019 CLINICAL DATA:  Fall EXAM: CT HEAD  WITHOUT CONTRAST CT CERVICAL SPINE WITHOUT CONTRAST TECHNIQUE: Multidetector CT imaging of the head and cervical spine was performed following the standard protocol without intravenous contrast. Multiplanar CT image reconstructions of the cervical spine were also generated. COMPARISON:  03/19/2019 FINDINGS: CT HEAD FINDINGS Brain: No acute intracranial abnormality. Specifically, no hemorrhage, hydrocephalus, mass lesion, acute infarction, or significant intracranial injury. Vascular: No hyperdense vessel or unexpected calcification. Skull: No acute calvarial abnormality. Sinuses/Orbits: Visualized paranasal sinuses and mastoids clear. Orbital soft tissues unremarkable. Other: None CT CERVICAL SPINE FINDINGS Alignment: No subluxation Skull base and vertebrae: No acute fracture. No primary bone lesion or focal pathologic process. Soft tissues and spinal canal: No prevertebral fluid or swelling. No visible canal hematoma. Disc levels:  Diffuse degenerative disc and facet disease. Upper chest: No acute findings Other: None IMPRESSION: No acute intracranial abnormality. Diffuse cervical spondylosis.  No acute bony abnormality. Electronically Signed   By: Rolm Baptise M.D.   On: 09/20/2019 22:43   Ct Cervical Spine Wo Contrast  Result Date: 09/20/2019 CLINICAL DATA:  Fall EXAM: CT HEAD WITHOUT CONTRAST CT CERVICAL SPINE WITHOUT CONTRAST TECHNIQUE: Multidetector CT imaging of the head and cervical spine was performed following the standard protocol without intravenous contrast. Multiplanar CT image reconstructions of the cervical spine were also generated. COMPARISON:  03/19/2019 FINDINGS: CT HEAD FINDINGS Brain: No acute intracranial abnormality. Specifically, no hemorrhage, hydrocephalus, mass lesion, acute infarction, or significant intracranial injury. Vascular: No hyperdense vessel or unexpected calcification. Skull: No acute calvarial abnormality. Sinuses/Orbits: Visualized paranasal sinuses and mastoids clear.  Orbital soft tissues unremarkable. Other: None CT CERVICAL SPINE FINDINGS Alignment: No subluxation Skull base and vertebrae: No acute fracture. No primary bone lesion or focal pathologic process. Soft tissues and spinal canal: No prevertebral fluid or swelling. No visible canal hematoma. Disc levels:  Diffuse degenerative disc and facet disease. Upper chest: No acute findings Other: None IMPRESSION: No acute intracranial abnormality. Diffuse cervical spondylosis.  No acute bony abnormality. Electronically Signed   By: Rolm Baptise M.D.   On: 09/20/2019 22:43   Dg Chest Port 1 View  Result Date: 09/22/2019 CLINICAL DATA:  Leukocytosis EXAM: PORTABLE CHEST 1 VIEW COMPARISON:  10/12/2018 FINDINGS: The heart size and mediastinal contours are within normal limits. Low volume examination without acute abnormality. The visualized skeletal structures are unremarkable. IMPRESSION: Low volume AP portable examination without acute abnormality of the lungs. Electronically Signed   By: Eddie Candle M.D.   On: 09/22/2019 08:28   Subjective: Patient was feeling better when seen this morning.  He has no new complaints.  He was excited to go to a rehab facility to get stronger.  Discharge Exam: Vitals:   10/03/19 0018 10/03/19 0949  BP: 132/78 130/84  Pulse: (!) 55 64  Resp: 16 18  Temp: (!) 97.4 F (36.3 C) (!) 97.4 F (36.3 C)  SpO2: 97% 93%   Vitals:   10/02/19 1517 10/02/19 1645 10/03/19 0018 10/03/19 0949  BP: 127/77 129/74 132/78 130/84  Pulse: 66 60 (!) 55 64  Resp: 16  16 18   Temp: 98.1 F (36.7 C)  (!) 97.4 F (36.3 C) (!) 97.4 F (36.3 C)  TempSrc: Oral  Oral   SpO2: 95%  97% 93%  Weight:      Height:        General: Pt is alert, awake, not in acute distress Cardiovascular: RRR, S1/S2 +, no rubs, no gallops Respiratory: CTA bilaterally, no wheezing, no rhonchi Abdominal: Soft, NT, ND, bowel sounds + Extremities: no edema, no cyanosis.  Right upper extremity contractures.  The  results of significant diagnostics from this hospitalization (including imaging, microbiology, ancillary and laboratory) are listed below for reference.     Microbiology: Recent Results (from the past 240 hour(s))  SARS CORONAVIRUS 2 (TAT 6-24 HRS) Nasopharyngeal Nasopharyngeal Swab     Status: None   Collection Time: 09/29/19  7:22 PM   Specimen: Nasopharyngeal Swab  Result Value Ref Range Status   SARS Coronavirus 2 NEGATIVE NEGATIVE Final    Comment: (NOTE) SARS-CoV-2 target nucleic acids are NOT DETECTED. The SARS-CoV-2 RNA is generally detectable in upper and lower respiratory specimens during the acute phase of infection. Negative results do not preclude SARS-CoV-2 infection, do not rule out co-infections with other pathogens, and should not be used as the sole basis for treatment or other patient management decisions. Negative results must be combined with clinical observations, patient history, and epidemiological information. The expected result is Negative. Fact Sheet for Patients: SugarRoll.be Fact Sheet for Healthcare Providers: https://www.woods-mathews.com/ This test is not yet approved or cleared by the [place] FDA and  has been authorized for detection and/or diagnosis of SARS-CoV-2 by FDA under an Emergency Use Authorization (EUA). This EUA will remain  in effect (meaning this test can be used) for the duration of the COVID-19 declaration under Section 56 4(b)(1) of the Act, 21 U.S.C. section 360bbb-3(b)(1), unless the authorization is terminated or revoked sooner. Performed at [facility], [place] [address] [place],  [address]      Labs: BNP (last 3 results) No results for input(s): BNP in the last 8760 hours. Basic Metabolic Panel: Recent Labs  Lab 09/27/19 0427 09/29/19 0433  NA 140 139  K 3.9 4.7  CL 103 104  CO2 26 24  GLUCOSE 96 90  BUN 19 20  CREATININE 0.46* 0.50*  CALCIUM 9.4 9.4   PHOS  --  4.2   Liver Function Tests: Recent Labs  Lab 09/29/19 0433  ALBUMIN 4.0   No results for input(s): LIPASE, AMYLASE in the last 168 hours. No results for input(s): AMMONIA in the last 168 hours. CBC: Recent Labs  Lab 09/27/19 0427 09/29/19 0433  WBC 7.1 8.3  HGB 14.2 15.3  HCT 40.0 42.6  MCV 89.5 88.8  PLT 152 254   Cardiac Enzymes: No results for input(s): CKTOTAL, CKMB, CKMBINDEX, TROPONINI in the last 168 hours. BNP: Invalid input(s): POCBNP CBG: No results for input(s): GLUCAP in the last 168 hours. D-Dimer No results for input(s): DDIMER in the last 72 hours. Hgb A1c No results for input(s): HGBA1C in the last 72 hours. Lipid Profile No results for input(s): CHOL, HDL, LDLCALC, TRIG, CHOLHDL, LDLDIRECT in the last 72 hours. Thyroid function studies No results for input(s): TSH, T4TOTAL, T3FREE, THYROIDAB in the last 72 hours.  Invalid input(s): FREET3 Anemia work up No results for input(s): VITAMINB12, FOLATE, FERRITIN, TIBC, IRON, RETICCTPCT in the last 72 hours. Urinalysis    Component Value Date/Time   COLORURINE YELLOW (A) 09/21/2019 0102  APPEARANCEUR CLEAR (A) 09/21/2019 0102   LABSPEC 1.010 09/21/2019 0102   PHURINE 7.0 09/21/2019 0102   GLUCOSEU NEGATIVE 09/21/2019 0102   HGBUR NEGATIVE 09/21/2019 0102   BILIRUBINUR NEGATIVE 09/21/2019 0102   KETONESUR NEGATIVE 09/21/2019 0102   PROTEINUR NEGATIVE 09/21/2019 0102   NITRITE NEGATIVE 09/21/2019 0102   LEUKOCYTESUR NEGATIVE 09/21/2019 0102   Sepsis Labs Invalid input(s): PROCALCITONIN,  WBC,  LACTICIDVEN Microbiology Recent Results (from the past 240 hour(s))  SARS CORONAVIRUS 2 (TAT 6-24 HRS) Nasopharyngeal Nasopharyngeal Swab     Status: None   Collection Time: 09/29/19  7:22 PM   Specimen: Nasopharyngeal Swab  Result Value Ref Range Status   SARS Coronavirus 2 NEGATIVE NEGATIVE Final    Comment: (NOTE) SARS-CoV-2 target nucleic acids are NOT DETECTED. The SARS-CoV-2 RNA is  generally detectable in upper and lower respiratory specimens during the acute phase of infection. Negative results do not preclude SARS-CoV-2 infection, do not rule out co-infections with other pathogens, and should not be used as the sole basis for treatment or other patient management decisions. Negative results must be combined with clinical observations, patient history, and epidemiological information. The expected result is Negative. Fact Sheet for Patients: SugarRoll.be Fact Sheet for Healthcare Providers: https://www.woods-mathews.com/ This test is not yet approved or cleared by the [place] FDA and  has been authorized for detection and/or diagnosis of SARS-CoV-2 by FDA under an Emergency Use Authorization (EUA). This EUA will remain  in effect (meaning this test can be used) for the duration of the COVID-19 declaration under Section 56 4(b)(1) of the Act, 21 U.S.C. section 360bbb-3(b)(1), unless the authorization is terminated or revoked sooner. Performed at [facility], [place] [address], [place], [place] [address]     Time coordinating discharge: Over 30 minutes  SIGNED:  Lorella Nimrod, MD  [facility] 10/03/2019, 9:53 AM Pager [phone]  If 7PM-7AM, please contact night-coverage [email or web address] Password [email or web address]  This record has been created using Systems analyst. Errors have been sought and corrected,but may not always be located. Such creation errors do not reflect on the standard of care.

## 2019-10-01 NOTE — Progress Notes (Signed)
PROGRESS NOTE    Douglas Peterson.  RUE:[id number] DOB: [birth year] DOA: 09/20/2019 PCP: [name], MD   Brief Narrative:  [name] a 68 year old male with past medical history of essential hypertension and alcoholism, was had multiple hospitalizations for alcohol withdrawals or trying to detox.  According to social work, is also had difficult placement issues because he will go to a skilled nursing facility check himself out and then drink some more.  Patient presented on 11/17 for confusion and was found to have alcohol induced delirium.  The rest of his work-up was unrevealing.  He was felt to have thiamine deficiency and started receiving supplementation for this.  His mentation is actually cleared up he is currently alert and oriented.  He is found to be however extremely deconditioned and malnourished. Patient was offered a bed at a skilled nursing facility, pending insurance improvement.  Subjective: Patient was feeling better when seen this morning.  No new complaints.  We will try regular diet.  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  Patient was much calmer.  Not requiring any Ativan. -Continue with thiamine.  Hyponatremia.  Resolved.  Hypertension.  Normotensive today. -Continue with Tenormin.  Protein caloric malnutrition/Physical deconditioning.  Patient appears very cachectic and malnourished. -Consult to nutritionist. -Continue PT.  Dysphagia.  Seen by speech therapy and he was placed on dysphagia 1 diet. Patient has an history of traumatic brain injury as told by himself today.  He has also having contractures of right upper extremity.  Strengths seems  close to normal. -Switch his diet to soft and advance as tolerated.  Alcoholic thrombocytopenia.  Improving platelet above 150 now.  Objective: Vitals:   09/29/19 2356 09/30/19 0748 10/01/19 0026 10/01/19 0757  BP: 129/80 130/82 120/68 137/69  Pulse: 62 67 66 (!) 57  Resp: 17 18 16 16   Temp: 97.9 F (36.6 C) 97.7 F (36.5 C) 97.8 F (36.6 C) 97.8 F (36.6 C)  TempSrc: Oral Oral Oral Oral  SpO2: 96% 96% 98% 98%  Weight:      Height:        Intake/Output Summary (Last 24 hours) at 10/01/2019 1514 Last data filed at 10/01/2019 0511 [place] per 24 hour  Intake -  Output 900 ml  Net -900 ml   Filed Weights   09/20/19 1756 09/20/19 1803 09/24/19 0500  Weight: 58.1 kg 53.5 kg 49.9 kg    Examination:  General exam: Cachectic and emaciated gentleman, appears calm and comfortable  Respiratory system: Clear to auscultation. Respiratory effort normal. Cardiovascular system: S1 & S2 heard, RRR. No JVD, murmurs, rubs, gallops or clicks. No pedal edema. Gastrointestinal system: Abdomen is nondistended, soft and nontender. No organomegaly or masses felt. Normal bowel sounds heard. Central nervous system: Alert and oriented. No focal neurological deficits. Extremities: Right upper extremity contractures.  Strength 4/5 in right upper extremity, rest 5/5 bilaterally. Psychiatry: Judgement and insight appear normal. Mood & affect appropriate.   DVT prophylaxis: Lovenox- Code Status: Full Family Communication: No family at bedside. Disposition Plan: SNF , pending insurance improvement.  Consultants:   None  Data Reviewed: I have personally reviewed following labs and imaging studies  CBC: Recent Labs  Lab 09/26/19 0439  09/27/19 0427 09/29/19 0433  WBC 6.4 7.1 8.3  HGB 13.1 14.2 15.3  HCT 36.7* 40.0 42.6  MCV 89.5 89.5 88.8  PLT 117* 152 742   Basic Metabolic Panel: Recent Labs  Lab 09/26/19 0439 09/27/19 0427 09/29/19 0433  NA 138 140 139  K 3.7 3.9 4.7  CL 105 103  104  CO2 24 26 24   GLUCOSE 94 96 90  BUN 13 19 20   CREATININE 0.49* 0.46* 0.50*  CALCIUM 8.9 9.4 9.4  PHOS  --   --  4.2   GFR: Estimated Creatinine Clearance: 62.4 mL/min (A) (by C-G formula based on SCr of 0.5 mg/dL (L)). Liver Function Tests: Recent Labs  Lab 09/29/19 0433  ALBUMIN 4.0   No results for input(s): LIPASE, AMYLASE in the last 168 hours. No results for input(s): AMMONIA in the last 168 hours. Coagulation Profile: No results for input(s): INR, PROTIME in the last 168 hours. Cardiac Enzymes: No results for input(s): CKTOTAL, CKMB, CKMBINDEX, TROPONINI in the last 168 hours. BNP (last 3 results) No results for input(s): PROBNP in the last 8760 hours. HbA1C: No results for input(s): HGBA1C in the last 72 hours. CBG: No results for input(s): GLUCAP in the last 168 hours. Lipid Profile: No results for input(s): CHOL, HDL, LDLCALC, TRIG, CHOLHDL, LDLDIRECT in the last 72 hours. Thyroid Function Tests: No results for input(s): TSH, T4TOTAL, FREET4, T3FREE, THYROIDAB in the last 72 hours. Anemia Panel: No results for input(s): VITAMINB12, FOLATE, FERRITIN, TIBC, IRON, RETICCTPCT in the last 72 hours. Sepsis Labs: No results for input(s): PROCALCITON, LATICACIDVEN in the last 168 hours.  Recent Results (from the past 240 hour(s))  MRSA PCR Screening     Status: None   Collection Time: 09/22/19  4:09 AM   Specimen: Nasopharyngeal  Result Value Ref Range Status   MRSA by PCR NEGATIVE NEGATIVE Final    Comment:        The GeneXpert MRSA Assay (FDA approved for NASAL specimens only), is one component of a comprehensive MRSA colonization surveillance program. It is not intended to diagnose MRSA infection nor to guide or monitor treatment for MRSA infections. Performed at [facility], [place], [place] [address]   SARS CORONAVIRUS 2 (TAT 6-24 HRS) Nasopharyngeal Nasopharyngeal Swab     Status: None   Collection Time: 09/29/19  7:22 PM    Specimen: Nasopharyngeal Swab  Result Value Ref Range Status   SARS Coronavirus 2 NEGATIVE NEGATIVE Final    Comment: (NOTE) SARS-CoV-2 target nucleic acids are NOT DETECTED. The SARS-CoV-2 RNA is generally detectable in upper and lower respiratory specimens during the acute phase of infection. Negative results do not preclude SARS-CoV-2 infection, do not rule out co-infections with other pathogens, and should not be used as the sole basis for treatment or other patient management decisions. Negative results must be combined with clinical observations, patient history, and epidemiological information. The expected result is Negative. Fact Sheet for Patients: SugarRoll.be Fact Sheet for Healthcare Providers: https://www.woods-mathews.com/ This test is not yet approved or cleared by the [place] FDA and  has been authorized for detection and/or diagnosis of SARS-CoV-2 by FDA under an Emergency Use Authorization (EUA). This EUA will remain  in effect (meaning this test can be used) for the duration of the COVID-19 declaration under Section 56 4(b)(1) of the Act, 21 U.S.C. section 360bbb-3(b)(1), unless the authorization is terminated or revoked sooner. Performed at [facility], [place] [address] [place],  [address]  Radiology Studies: No results found.  Scheduled Meds: . aspirin EC  81 mg Oral Daily  . atenolol  25 mg Oral QPM  . baclofen  10 mg Oral TID  . Chlorhexidine Gluconate Cloth  6 each Topical Q0600  . enoxaparin (LOVENOX) injection  40 mg Subcutaneous Q24H  . multivitamin with minerals  1 tablet Oral Daily  . PARoxetine  30 mg Oral Daily  . thiamine  100 mg Oral Daily  . vitamin C  1,000 mg Oral Daily   Continuous Infusions:   LOS: 10 days   Time spent: 20 minutes.  Lorella Nimrod, MD [facility] Pager [phone]  If 7PM-7AM, please contact night-coverage [email or web address] Password [facility]  10/01/2019, 3:14 PM   This record has been created using Dragon voice recognition software. Errors have been sought and corrected,but may not always be located. Such creation errors do not reflect on the standard of care.

## 2019-10-01 NOTE — Plan of Care (Signed)
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
## 2019-10-03 NOTE — TOC Transition Note (Signed)
Transition of Care [facility]) - CM/SW Discharge Note   Patient Details  Name: Douglas Peterson. MRN: [id number] Date of Birth: [birth year]  [facility]) CM/SW Contact:  Shelbie Hutching, RN Phone Number: 10/03/2019, 10:01 AM   Clinical Narrative:    Patient will discharge to [facility] today.  [name] going to room 804, bedside RN to call report to [phone].     Final next level of care: Skilled Nursing Facility Barriers to Discharge: Barriers Resolved   Patient Goals and CMS Choice   CMS Medicare.gov Compare Post Acute Care list provided to:: Patient Choice offered to / list presented to : Patient  Discharge Placement              Patient chooses bed at: [facility] [place] Patient to be transferred to facility by:  EMS Name of family member notified: Lance Sell- Sister Patient and family notified of of transfer: 10/03/19  Discharge Plan and Services   Discharge Planning Services: CM Consult                                 Social Determinants of Health (SDOH) Interventions     Readmission Risk Interventions No flowsheet data found.

## 2019-10-03 NOTE — Care Management Important Message (Signed)
Important Message  Patient Details  Name: Douglas Peterson. MRN: [id number] Date of Birth: 08/19/1951   Medicare Important Message Given:  Yes     Juliann Pulse A  10/03/2019, 11:40 AM

## 2019-10-30 ENCOUNTER — Encounter: Payer: Self-pay | Admitting: Emergency Medicine

## 2019-10-30 ENCOUNTER — Emergency Department: Payer: Medicare HMO

## 2019-10-30 ENCOUNTER — Emergency Department
Admission: EM | Admit: 2019-10-30 | Discharge: 2019-11-07 | Disposition: A | Discharge status: Rehabilitation Facility | Payer: Medicare HMO | Attending: Student | Admitting: Student

## 2019-10-30 ENCOUNTER — Other Ambulatory Visit: Payer: Self-pay

## 2019-10-30 DIAGNOSIS — Z20828 Contact with and (suspected) exposure to other viral communicable diseases: Secondary | ICD-10-CM | POA: Diagnosis not present

## 2019-10-30 DIAGNOSIS — F1020 Alcohol dependence, uncomplicated: Secondary | ICD-10-CM | POA: Insufficient documentation

## 2019-10-30 DIAGNOSIS — R531 Weakness: Secondary | ICD-10-CM | POA: Diagnosis present

## 2019-10-30 DIAGNOSIS — Z87891 Personal history of nicotine dependence: Secondary | ICD-10-CM | POA: Insufficient documentation

## 2019-10-30 DIAGNOSIS — F1010 Alcohol abuse, uncomplicated: Secondary | ICD-10-CM

## 2019-10-30 DIAGNOSIS — Z7982 Long term (current) use of aspirin: Secondary | ICD-10-CM | POA: Insufficient documentation

## 2019-10-30 DIAGNOSIS — R2681 Unsteadiness on feet: Secondary | ICD-10-CM | POA: Insufficient documentation

## 2019-10-30 DIAGNOSIS — I10 Essential (primary) hypertension: Secondary | ICD-10-CM | POA: Insufficient documentation

## 2019-10-30 DIAGNOSIS — R262 Difficulty in walking, not elsewhere classified: Secondary | ICD-10-CM | POA: Diagnosis not present

## 2019-10-30 DIAGNOSIS — R1013 Epigastric pain: Secondary | ICD-10-CM | POA: Diagnosis not present

## 2019-10-30 DIAGNOSIS — Z79899 Other long term (current) drug therapy: Secondary | ICD-10-CM | POA: Diagnosis not present

## 2019-10-30 LAB — URINALYSIS, COMPLETE (UACMP) WITH MICROSCOPIC
Bacteria, UA: NONE SEEN
Bilirubin Urine: NEGATIVE
Glucose, UA: NEGATIVE mg/dL
Hgb urine dipstick: NEGATIVE
Ketones, ur: NEGATIVE mg/dL
Leukocytes,Ua: NEGATIVE
Nitrite: NEGATIVE
Protein, ur: NEGATIVE mg/dL
Specific Gravity, Urine: 1.002 — ABNORMAL LOW (ref 1.005–1.030)
Squamous Epithelial / HPF: NONE SEEN (ref 0–5)
pH: 7 (ref 5.0–8.0)

## 2019-10-30 LAB — URINE DRUG SCREEN, QUALITATIVE (ARMC ONLY)
Amphetamines, Ur Screen: NOT DETECTED
Barbiturates, Ur Screen: NOT DETECTED
Benzodiazepine, Ur Scrn: NOT DETECTED
Cannabinoid 50 Ng, Ur ~~LOC~~: NOT DETECTED
Cocaine Metabolite,Ur ~~LOC~~: NOT DETECTED
MDMA (Ecstasy)Ur Screen: NOT DETECTED
Methadone Scn, Ur: NOT DETECTED
Opiate, Ur Screen: NOT DETECTED
Phencyclidine (PCP) Ur S: NOT DETECTED
Tricyclic, Ur Screen: NOT DETECTED

## 2019-10-30 LAB — CBC WITH DIFFERENTIAL/PLATELET
Abs Immature Granulocytes: 0.02 10*3/uL (ref 0.00–0.07)
Basophils Absolute: 0 10*3/uL (ref 0.0–0.1)
Basophils Relative: 0 %
Eosinophils Absolute: 0 10*3/uL (ref 0.0–0.5)
Eosinophils Relative: 0 %
HCT: 35.1 % — ABNORMAL LOW (ref 39.0–52.0)
Hemoglobin: 12.7 g/dL — ABNORMAL LOW (ref 13.0–17.0)
Immature Granulocytes: 0 %
Lymphocytes Relative: 48 %
Lymphs Abs: 3.6 10*3/uL (ref 0.7–4.0)
MCH: 31.6 pg (ref 26.0–34.0)
MCHC: 36.2 g/dL — ABNORMAL HIGH (ref 30.0–36.0)
MCV: 87.3 fL (ref 80.0–100.0)
Monocytes Absolute: 0.5 10*3/uL (ref 0.1–1.0)
Monocytes Relative: 7 %
Neutro Abs: 3.3 10*3/uL (ref 1.7–7.7)
Neutrophils Relative %: 45 %
Platelets: 83 10*3/uL — ABNORMAL LOW (ref 150–400)
RBC: 4.02 MIL/uL — ABNORMAL LOW (ref 4.22–5.81)
RDW: 11.9 % (ref 11.5–15.5)
WBC: 7.4 10*3/uL (ref 4.0–10.5)
nRBC: 0 % (ref 0.0–0.2)

## 2019-10-30 LAB — LIPASE, BLOOD: Lipase: 64 U/L — ABNORMAL HIGH (ref 11–51)

## 2019-10-30 LAB — ACETAMINOPHEN LEVEL: Acetaminophen (Tylenol), Serum: <10 ug/mL — ABNORMAL LOW (ref 10–30)

## 2019-10-30 LAB — COMPREHENSIVE METABOLIC PANEL
ALT: 27 U/L (ref 0–44)
AST: 41 U/L (ref 15–41)
Albumin: 4 g/dL (ref 3.5–5.0)
Alkaline Phosphatase: 82 U/L (ref 38–126)
Anion gap: 14 (ref 5–15)
BUN: 5 mg/dL — ABNORMAL LOW (ref 8–23)
CO2: 24 mmol/L (ref 22–32)
Calcium: 8.3 mg/dL — ABNORMAL LOW (ref 8.9–10.3)
Chloride: 92 mmol/L — ABNORMAL LOW (ref 98–111)
Creatinine, Ser: 0.32 mg/dL — ABNORMAL LOW (ref 0.61–1.24)
GFR calc Af Amer: >60 mL/min (ref >60)
GFR calc non Af Amer: >60 mL/min (ref >60)
Glucose, Bld: 106 mg/dL — ABNORMAL HIGH (ref 70–99)
Potassium: 3.6 mmol/L (ref 3.5–5.1)
Sodium: 130 mmol/L — ABNORMAL LOW (ref 135–145)
Total Bilirubin: 0.8 mg/dL (ref 0.3–1.2)
Total Protein: 7 g/dL (ref 6.5–8.1)

## 2019-10-30 LAB — ETHANOL: Alcohol, Ethyl (B): 293 mg/dL — ABNORMAL HIGH (ref <10)

## 2019-10-30 LAB — SALICYLATE LEVEL: Salicylate Lvl: <7 mg/dL — ABNORMAL LOW (ref 7.0–30.0)

## 2019-10-30 LAB — TROPONIN I (HIGH SENSITIVITY): Troponin I (High Sensitivity): 6 ng/L (ref <18)

## 2019-10-30 LAB — CK: Total CK: 250 U/L (ref 49–397)

## 2019-10-30 MED ORDER — LORAZEPAM 2 MG PO TABS
0.0000 mg | ORAL_TABLET | Freq: Four times a day (QID) | ORAL | Status: AC
  Administered 2019-11-01: 1 mg via ORAL
  Filled 2019-10-30: qty 1

## 2019-10-30 MED ORDER — LORAZEPAM 2 MG/ML IJ SOLN
0.0000 mg | Freq: Four times a day (QID) | INTRAMUSCULAR | Status: AC
  Administered 2019-10-31 – 2019-11-01 (×5): 2 mg via INTRAVENOUS
  Filled 2019-10-30 (×5): qty 1

## 2019-10-30 MED ORDER — SODIUM CHLORIDE 0.9 % IV SOLN
1.0000 mg | Freq: Once | INTRAVENOUS | Status: AC
  Administered 2019-10-30: 1 mg via INTRAVENOUS
  Filled 2019-10-30: qty 0.2

## 2019-10-30 MED ORDER — SODIUM CHLORIDE 0.9 % IV BOLUS
1000.0000 mL | Freq: Once | INTRAVENOUS | Status: AC
  Administered 2019-10-30: 1000 mL via INTRAVENOUS

## 2019-10-30 MED ORDER — LORAZEPAM 2 MG/ML IJ SOLN: 0.0000 mg | Freq: Two times a day (BID) | INTRAMUSCULAR | Status: DC

## 2019-10-30 MED ORDER — THIAMINE HCL 100 MG/ML IJ SOLN
100.0000 mg | Freq: Once | INTRAMUSCULAR | Status: AC
  Administered 2019-10-30: 100 mg via INTRAVENOUS
  Filled 2019-10-30: qty 2

## 2019-10-30 MED ORDER — LORAZEPAM 2 MG PO TABS: 0.0000 mg | ORAL_TABLET | Freq: Two times a day (BID) | ORAL | Status: DC

## 2019-10-30 NOTE — BH Assessment (Signed)
TTS attempted to see patient for assessment, patient was sleep and unable to be waken up. Nurse suggested to try again in the morning.

## 2019-10-30 NOTE — ED Triage Notes (Signed)
EMS pt to Rm 20 from home with report of sitting on the couch for at least 5 days and not moving. Pt does report he fell several days ago hitting his head. Pt reports he drinks around 18 beers a days and is wanting help with detox.

## 2019-10-30 NOTE — ED Notes (Signed)
Pt to CT
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
## 2019-10-31 NOTE — ED Notes (Signed)
Hourly rounding reveals patient in room. No complaints, stable, in no acute distress. Q15 minute rounds and monitoring via Engineer, drilling to continue.
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
## 2019-10-31 NOTE — ED Notes (Signed)
BEHAVIORAL HEALTH ROUNDING Patient sleeping: No. Patient alert and oriented: yes Behavior appropriate: Yes.  ; If no, describe:  Nutrition and fluids offered: yes Toileting and hygiene offered: Yes  Sitter present: q15 minute observations Law enforcement present: Yes  [employer]

## 2019-10-31 NOTE — BH Assessment (Signed)
Referral information for Detox placement faxed to;     [facility] [phone])  [facility] [phone])  [facility] [phone])

## 2019-10-31 NOTE — ED Notes (Signed)
TTS in speaking with pt.

## 2019-10-31 NOTE — ED Notes (Signed)
BEHAVIORAL HEALTH ROUNDING Patient sleeping: Yes.   Patient alert and oriented: eyes closed  Appears asleep Behavior appropriate: Yes.  ; If no, describe:  Nutrition and fluids offered: Yes  Toileting and hygiene offered: sleeping Sitter present: q 15 minute observations  Law enforcement present: yes  [employer]

## 2019-10-31 NOTE — ED Notes (Signed)
Report to include Situation, Background, Assessment, and Recommendations received from Amy RN. Patient alert and oriented, warm and dry, in no acute distress. Patient denies SI, HI, AVH and pain. Patient made aware of Q15 minute rounds and Engineer, drilling presence for their safety. Patient instructed to come to me with needs or concerns.

## 2019-10-31 NOTE — ED Notes (Signed)
BEHAVIORAL HEALTH ROUNDING Patient sleeping: No. Patient alert and oriented: yes Behavior appropriate: Yes.  ; If no, describe:  Nutrition and fluids offered: yes Toileting and hygiene offered: Yes  Sitter present: q15 minute observations  Law enforcement present: Yes  [employer]

## 2019-10-31 NOTE — ED Notes (Signed)
pts BP elevated - requested pharmacy to update his meds so that I may get MD to order them

## 2019-10-31 NOTE — ED Notes (Signed)
BEHAVIORAL HEALTH ROUNDING Patient sleeping: No. Patient alert and oriented: yes Behavior appropriate: Yes.  ; If no, describe:  Nutrition and fluids offered: yes Toileting and hygiene offered: Yes  Sitter present: q15 minute observations  Law enforcement present: Yes  [employer]  ENVIRONMENTAL ASSESSMENT Potentially harmful objects out of patient reach: Yes.   Personal belongings secured: Yes.   Patient dressed in hospital provided attire only: Yes.   Plastic bags out of patient reach: Yes.   Patient care equipment (cords, cables, call bells, lines, and drains) shortened, removed, or accounted for: Yes.   Equipment and supplies removed from bottom of stretcher: Yes.   Potentially toxic materials out of patient reach: Yes.   Sharps container removed or out of patient reach: Yes.

## 2019-10-31 NOTE — ED Notes (Signed)
BEHAVIORAL HEALTH ROUNDING Patient sleeping: No. Patient alert and oriented: yes Behavior appropriate: Yes.  ; If no, describe:  Nutrition and fluids offered: yes Toileting and hygiene offered: Yes  Sitter present: q15 minute observations  Law enforcement present: Yes [employer]

## 2019-10-31 NOTE — ED Notes (Signed)
Patient observed lying in bed with eyes closed  Even, unlabored respirations observed   NAD pt appears to be sleeping  I will continue to monitor along with every 15 minute visual observations

## 2019-10-31 NOTE — ED Notes (Signed)
ED  Is the patient under IVC or is there intent for IVC:  voluntary Is the patient medically cleared:    Is there vacancy in the ED BHU:  Is the population mix appropriate for patient:  Is the patient awaiting placement in inpatient or outpatient setting: Yes.   TTS is seeking a detox treatment facility for him  Has the patient had a psychiatric consult: n/a Survey of unit performed for contraband, proper placement and condition of furniture, tampering with fixtures in bathroom, shower, and each patient room: Yes.  ; Findings:  APPEARANCE/BEHAVIOR Calm and cooperative NEURO ASSESSMENT Orientation: oriented x3  Denies pain Hallucinations: No.None noted (Hallucinations) denies  Speech: Normal Gait: normal RESPIRATORY ASSESSMENT Even  Unlabored respirations  CARDIOVASCULAR ASSESSMENT Pulses equal   regular rate  Skin warm and dry   GASTROINTESTINAL ASSESSMENT no GI complaint EXTREMITIES Right arm is flaccid  - pt reports no use in three years  Full ROM to lue and bilateral lower extremities   PLAN OF CARE Provide calm/safe environment. Vital signs assessed twice daily. ED BHU Assessment once each 12-hour shift.  Assure the ED provider has rounded once each shift. Provide and encourage hygiene. Provide redirection as needed. Assess for escalating behavior; address immediately and inform ED provider.  Assess family dynamic and appropriateness for visitation as needed: Yes.  ; If necessary, describe findings:  Educate the patient/family about BHU procedures/visitation: Yes.  ; If necessary, describe findings:

## 2019-10-31 NOTE — ED Notes (Signed)
Meal tray given. Ginger ale given to drink

## 2019-10-31 NOTE — BH Assessment (Signed)
Referral information for Detox placement faxed to;    [name] [phone]) . [facility] [phone])

## 2019-10-31 NOTE — ED Notes (Signed)
VOL  PENDING  PLACEMENT

## 2019-11-01 MED ORDER — ATENOLOL 25 MG PO TABS
25.0000 mg | ORAL_TABLET | Freq: Every evening | ORAL | Status: DC
  Administered 2019-11-01 – 2019-11-06 (×7): 25 mg via ORAL
  Filled 2019-11-01 (×6): qty 1

## 2019-11-01 MED ORDER — ADULT MULTIVITAMIN W/MINERALS CH
1.0000 | ORAL_TABLET | Freq: Every day | ORAL | Status: DC
  Administered 2019-11-01 – 2019-11-07 (×7): 1 via ORAL
  Filled 2019-11-01 (×7): qty 1

## 2019-11-01 MED ORDER — FOLIC ACID 1 MG PO TABS
1.0000 mg | ORAL_TABLET | Freq: Every day | ORAL | Status: DC
  Administered 2019-11-01 – 2019-11-07 (×7): 1 mg via ORAL
  Filled 2019-11-01 (×7): qty 1

## 2019-11-01 MED ORDER — VITAMIN D 25 MCG (1000 UNIT) PO TABS
5000.0000 [IU] | ORAL_TABLET | Freq: Every day | ORAL | Status: DC
  Administered 2019-11-01 – 2019-11-07 (×7): 5000 [IU] via ORAL
  Filled 2019-11-01 (×7): qty 5

## 2019-11-01 MED ORDER — CHLORDIAZEPOXIDE HCL 25 MG PO CAPS
25.0000 mg | ORAL_CAPSULE | Freq: Three times a day (TID) | ORAL | Status: AC
  Administered 2019-11-01 (×3): 25 mg via ORAL
  Filled 2019-11-01 (×2): qty 1

## 2019-11-01 MED ORDER — ASPIRIN EC 81 MG PO TBEC
81.0000 mg | DELAYED_RELEASE_TABLET | Freq: Every day | ORAL | Status: DC
  Administered 2019-11-01 – 2019-11-07 (×7): 81 mg via ORAL
  Filled 2019-11-01 (×7): qty 1

## 2019-11-01 MED ORDER — DOXEPIN HCL 10 MG PO CAPS
10.0000 mg | ORAL_CAPSULE | Freq: Every evening | ORAL | Status: DC | PRN
  Administered 2019-11-01: 10 mg via ORAL
  Filled 2019-11-01 (×2): qty 1

## 2019-11-01 MED ORDER — CHLORDIAZEPOXIDE HCL 25 MG PO CAPS
25.0000 mg | ORAL_CAPSULE | Freq: Two times a day (BID) | ORAL | Status: DC
  Administered 2019-11-02 – 2019-11-07 (×11): 25 mg via ORAL
  Filled 2019-11-01 (×11): qty 1

## 2019-11-01 MED ORDER — FOLIC ACID 400 MCG PO TABS: 400.0000 ug | ORAL_TABLET | Freq: Every day | ORAL | Status: DC

## 2019-11-01 MED ORDER — THIAMINE HCL 100 MG PO TABS
100.0000 mg | ORAL_TABLET | Freq: Every day | ORAL | Status: DC
  Administered 2019-11-01 – 2019-11-07 (×7): 100 mg via ORAL
  Filled 2019-11-01 (×7): qty 1

## 2019-11-01 MED ORDER — HYDROXYZINE HCL 25 MG PO TABS: 25.0000 mg | ORAL_TABLET | Freq: Three times a day (TID) | ORAL | Status: DC | PRN

## 2019-11-01 MED ORDER — CALCIUM CITRATE-VITAMIN D 500-500 MG-UNIT PO CHEW
1.0000 | CHEWABLE_TABLET | Freq: Every day | ORAL | Status: DC
  Administered 2019-11-02 – 2019-11-07 (×5): 1 via ORAL
  Filled 2019-11-01 (×8): qty 1

## 2019-11-01 MED ORDER — PAROXETINE HCL 30 MG PO TABS
30.0000 mg | ORAL_TABLET | Freq: Every day | ORAL | Status: DC
  Administered 2019-11-01 – 2019-11-07 (×7): 30 mg via ORAL
  Filled 2019-11-01 (×9): qty 1

## 2019-11-01 MED ORDER — ASCORBIC ACID 500 MG PO TABS
1000.0000 mg | ORAL_TABLET | Freq: Every day | ORAL | Status: DC
  Administered 2019-11-01 – 2019-11-07 (×7): 1000 mg via ORAL
  Filled 2019-11-01 (×7): qty 2

## 2019-11-01 MED ORDER — BACLOFEN 10 MG PO TABS
10.0000 mg | ORAL_TABLET | Freq: Three times a day (TID) | ORAL | Status: DC
  Administered 2019-11-01 – 2019-11-07 (×19): 10 mg via ORAL
  Filled 2019-11-01 (×24): qty 1

## 2019-11-01 MED ORDER — ENSURE ENLIVE PO LIQD
237.0000 mL | Freq: Two times a day (BID) | ORAL | Status: DC
  Administered 2019-11-02 – 2019-11-07 (×6): 237 mL via ORAL
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
## 2019-11-01 NOTE — ED Notes (Signed)
BEHAVIORAL HEALTH ROUNDING Patient sleeping: No. Patient alert and oriented: yes Behavior appropriate: Yes.  ; If no, describe:  Nutrition and fluids offered: yes Toileting and hygiene offered: Yes  Sitter present: q15 minute observations
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
## 2019-11-01 NOTE — ED Notes (Signed)
Pt assisted with the urinal. Pt assisted with putting on new underwear. Pt covered back up and trying to go back to sleep at this time.

## 2019-11-01 NOTE — ED Notes (Signed)
BEHAVIORAL HEALTH ROUNDING Patient sleeping: Yes.   Patient alert and oriented: eyes closed  Appears asleep Behavior appropriate: Yes.  ; If no, describe:  Nutrition and fluids offered: Yes  Toileting and hygiene offered: sleeping Sitter present: q 15 minute observations  Law enforcement present: yes  [employer]

## 2019-11-01 NOTE — ED Provider Notes (Signed)
-----------------------------------------   6:07 AM on 11/01/2019 -----------------------------------------   Blood pressure (!) 161/108, pulse (!) 108, temperature 98 F (36.7 C), temperature source Oral, resp. rate 18, height 1.626 m (5' 4" ), weight 56.7 kg, SpO2 99 %.  The patient is calm and cooperative at this time.  There have been no acute events since the last update.  Awaiting disposition plan from Behavioral Medicine and/or Social Work team(s).   Hinda Kehr, MD 11/01/19 [phone]

## 2019-11-01 NOTE — ED Notes (Signed)
Hourly rounding reveals patient in room. No complaints, stable, in no acute distress. Q15 minute rounds and monitoring via Engineer, drilling to continue.
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
## 2019-11-01 NOTE — Evaluation (Signed)
Physical Therapy Evaluation Patient Details Name: Douglas Peterson. MRN: [id number] DOB: [birth year] Today's Date: 11/01/2019   History of Present Illness  Pt is a 68 year old Caucasian male who lives on his own, presented to the emergency department on a voluntary basis due to alcohol abuse.  PMH includes alcohol abuse, HTN, hyponatremia, kyphoplasty, and CVA with R-sided weakness.    Clinical Impression  Pt presented with deficits in strength, transfers, mobility, gait, balance, and activity tolerance.  Pt required extensive physical assistance with most functional mobility tasks and was unable to ambulate secondary to significant instability in standing.  Pt was moderately tremulous throughout the session and is at a very high risk for falls.  Pt was unable to grasp the RW with his R-hand which he is typically able to do at baseline and even using his L hand to attempt to open his R hand he was unable to do so.  This PT assisted pt in opening hand and placed a rolled washcloth in the pt's hand for improved positioning.  Pt presented with significant deficits in functional mobility compared to his baseline and has limited assistance at home making discharge to his prior living situation unsafe at this time. Pt will benefit from PT services in a SNF setting upon discharge to safely address above deficits for decreased caregiver assistance and eventual return to PLOF.      Follow Up Recommendations SNF    Equipment Recommendations  None recommended by PT    Recommendations for Other Services       Precautions / Restrictions Precautions Precautions: Fall Restrictions Weight Bearing Restrictions: No      Mobility  Bed Mobility Overal bed mobility: Needs Assistance Bed Mobility: Supine to Sit;Sit to Supine     Supine to sit: Min assist Sit to supine: Min assist   General bed mobility comments: Min A to come to full upright sitting during sup to sit and min A for final positioning  during sit to sup  Transfers Overall transfer level: Needs assistance Equipment used: Rolling walker (2 wheeled);Quad cane Transfers: Sit to/from Garment/textile technologist to Stand: Max assist;Mod assist        Lateral/Scoot Transfers: Min assist General transfer comment: Pt required extensive assist to stand and then to prevent posterior LOB once in standing; min A with lateral scoot transfers with cues for sequencing  Ambulation/Gait             General Gait Details: Unable  Stairs            Wheelchair Mobility    Modified Rankin (Stroke Patients Only)       Balance Overall balance assessment: Needs assistance Sitting-balance support: Single extremity supported;Feet supported Sitting balance-Leahy Scale: Fair   Postural control: Posterior lean Standing balance support: Single extremity supported Standing balance-Leahy Scale: Poor Standing balance comment: Extensive assist required to prevent posterior LOB in standing                             Pertinent Vitals/Pain Pain Assessment: No/denies pain    Home Living Family/patient expects to be discharged to:: Private residence Living Arrangements: Alone Available Help at Discharge: Family;Available PRN/intermittently(Sister assists intermittently) Type of Home: House Home Access: Stairs to enter Entrance Stairs-Rails: None(Holds door frame) Entrance Stairs-Number of Steps: 2 Home Layout: One level Home Equipment: Walker - 2 wheels;Cane - single point;Shower seat;Grab bars - tub/shower;Grab bars - toilet      Prior Function Level  of Independence: Independent with assistive device(s)         Comments: Pt states that he is Mod Ind with amb with either a SPC or more recently a RW, multiple falls in the last year, Ind with ADLs, drives     Hand Dominance        Extremity/Trunk Assessment   Upper Extremity Assessment Upper Extremity Assessment: Generalized weakness;RUE  deficits/detail RUE Deficits / Details: Pt with h/o CVA and R-sided weakness; pt with fingers tightly flexed and unable to open hand to hold the RW    Lower Extremity Assessment Lower Extremity Assessment: Generalized weakness;RLE deficits/detail RLE Deficits / Details: h/o RLE weakness from CVA       Communication   Communication: No difficulties  Cognition Arousal/Alertness: Lethargic Behavior During Therapy: Flat affect Overall Cognitive Status: No family/caregiver present to determine baseline cognitive functioning                                        General Comments      Exercises Other Exercises Other Exercises: Nursing/pt education provided on R hand positioning with pt's hand placed around small towel roll to prevent finger flex contractures Other Exercises: Anterior weight shifting activities in sitting and standing to address posterior instability Other Exercises: Sit to/from stand and lateral scoot transfer training from various height surfaces   Assessment/Plan    PT Assessment Patient needs continued PT services  PT Problem List Decreased strength;Decreased balance;Decreased mobility;Decreased safety awareness       PT Treatment Interventions Gait training;Stair training;Functional mobility training;Therapeutic activities;Therapeutic exercise;DME instruction;Balance training;Patient/family education    PT Goals (Current goals can be found in the Care Plan section)  Acute Rehab PT Goals Patient Stated Goal: To get better and back to myself PT Goal Formulation: With patient Time For Goal Achievement: 11/14/19 Potential to Achieve Goals: Fair    Frequency Min 2X/week   Barriers to discharge Inaccessible home environment;Decreased caregiver support      Co-evaluation               AM-PAC PT "6 Clicks" Mobility  Outcome Measure Help needed turning from your back to your side while in a flat bed without using bedrails?: A  Little Help needed moving from lying on your back to sitting on the side of a flat bed without using bedrails?: A Little Help needed moving to and from a bed to a chair (including a wheelchair)?: A Lot Help needed standing up from a chair using your arms (e.g., wheelchair or bedside chair)?: A Lot Help needed to walk in hospital room?: Total Help needed climbing 3-5 steps with a railing? : Total 6 Click Score: 12    End of Session Equipment Utilized During Treatment: Gait belt Activity Tolerance: Patient tolerated treatment well Patient left: in bed;with nursing/sitter in room Nurse Communication: Mobility status PT Visit Diagnosis: Unsteadiness on feet (R26.81);Muscle weakness (generalized) (M62.81);Difficulty in walking, not elsewhere classified (R26.2)    Time: 4034-7425 PT Time Calculation (min) (ACUTE ONLY): 34 min   Charges:   PT Evaluation $PT Eval Moderate Complexity: 1 Mod PT Treatments $Therapeutic Activity: 8-22 mins        D. Royetta Asal PT, DPT 11/01/19, 5:56 PM

## 2019-11-01 NOTE — ED Notes (Signed)
Pt given ice water with a straw per [place], RN.

## 2019-11-01 NOTE — ED Notes (Signed)
Pt called out and asked this RN and ED tech Shanon Brow if there was any beer that he could have. We informed him that he could not have any beer in the hospital.

## 2019-11-01 NOTE — ED Notes (Signed)
Breakfast tray provided for pt.

## 2019-11-01 NOTE — ED Notes (Signed)
Spoke with EDP  PT and social work consults ordered  Pt will be here holding for placement

## 2019-11-01 NOTE — ED Notes (Signed)
Assisted pt to use the urinal. Pt called out for this RN to let me know his IV had come out w/ movement in the bed.

## 2019-11-01 NOTE — ED Notes (Signed)
BEHAVIORAL HEALTH ROUNDING Patient sleeping: No. Patient alert and oriented: yes Behavior appropriate: Yes.  ; If no, describe:  Nutrition and fluids offered: yes Toileting and hygiene offered: Yes  Sitter present: q15 minute observations Law enforcement present: Yes  [employer]

## 2019-11-01 NOTE — ED Notes (Signed)
BEHAVIORAL HEALTH ROUNDING Patient sleeping: No. Patient alert and oriented: yes Behavior appropriate: Yes.  ; If no, describe:  Nutrition and fluids offered: yes Toileting and hygiene offered: Yes  Sitter present: q15 minute observations  Law enforcement present: Yes  [employer]

## 2019-11-01 NOTE — ED Notes (Signed)
ED Is the patient under IVC or is there intent for IVC:  voluntary Is the patient medically cleared: Yes.   Is there vacancy in the ED BHU: Yes.   Is the population mix appropriate for patient: Yes.   Is the patient awaiting placement in inpatient or outpatient setting: Yes.   Has the patient had a psychiatric consult: Yes.   Survey of unit performed for contraband, proper placement and condition of furniture, tampering with fixtures in bathroom, shower, and each patient room: Yes.  ; Findings:  APPEARANCE/BEHAVIOR Calm and cooperative NEURO ASSESSMENT Orientation: oriented x3   Hallucinations: No.None noted (Hallucinations)  denies Speech: Normal Gait: normal RESPIRATORY ASSESSMENT Even  Unlabored respirations  CARDIOVASCULAR ASSESSMENT Pulses equal   regular rate  Skin warm and dry   GASTROINTESTINAL ASSESSMENT no GI complaint EXTREMITIES Full ROM  PLAN OF CARE Provide calm/safe environment. Vital signs assessed twice daily. ED BHU Assessment once each 12-hour shift.  Assure the ED provider has rounded once each shift. Provide and encourage hygiene. Provide redirection as needed. Assess for escalating behavior; address immediately and inform ED provider.  Assess family dynamic and appropriateness for visitation as needed: Yes.  ; If necessary, describe findings:  Educate the patient/family about BHU procedures/visitation: Yes.  ; If necessary, describe findings:

## 2019-11-01 NOTE — ED Notes (Addendum)
Spoke with MD about plan of care for this pt  -MD reassessed the pt and the pt verbalized a desire to go home  - MD reports pt may discharge to home in a cab

## 2019-11-01 NOTE — ED Notes (Signed)
Pt to discharge to home via cab  - as we are assisting him into the cab the pt states to the driver  "I cannot get in my house so you are going to have to help me"  Pt attempting to call his sister so that she may meet him at home to unlock his house  Pt states  "my sister has blocked my calls"  Charge RN called for assistance   Cab driver refused to take pt home  Voucher returned to me and I shredded it   Pt back into ED  Charge RN verbalizes that he may return home via EMS   Pt then called his sister again so that she may unlock his house  Pt states  "She still has my calls blocked "

## 2019-11-01 NOTE — ED Notes (Signed)
PT assessing pt at bedside at this time.

## 2019-11-01 NOTE — BH Assessment (Signed)
Referral information forDetox placementfaxed to;   [facility] [phone])   [facility] [phone]) - DENIED, due to insurance  [facility] [phone])

## 2019-11-01 NOTE — ED Notes (Signed)
Repositioned him in bed - urinal placed where he can reach  - walker provided to him to encourage ambulation to and from the BR  - he is requesting withdrawal medication  - plan of care discussed including medications, pending detox placement and that TTS is having difficulty finding him a bed in detox  Assessment completed

## 2019-11-01 NOTE — ED Notes (Addendum)
Discussed planned discharge with him - he called his sister for a ride home  - sister spoke with me on the phone  Stating  "I am expecting a [employer] package ot be delivered and once it comes then I will be there to pick him up"

## 2019-11-01 NOTE — ED Notes (Signed)

## 2019-11-01 NOTE — ED Notes (Signed)
Pt. Alert and oriented, warm and dry, in no distress. Pt. Denies SI, HI, and AVH. Pt. Encouraged to let nursing staff know of any concerns or needs. 

## 2019-11-02 LAB — SARS CORONAVIRUS 2 (TAT 6-24 HRS): SARS Coronavirus 2: NEGATIVE

## 2019-11-02 NOTE — Progress Notes (Signed)
Physical Therapy Treatment Patient Details Name: Douglas Peterson. MRN: [id number] DOB: [birth year] Today's Date: 11/02/2019    History of Present Illness Pt is a 68 year old Caucasian male who lives on his own, presented to the emergency department on a voluntary basis due to alcohol abuse.  PMH includes alcohol abuse, HTN, hyponatremia, kyphoplasty, and CVA with R-sided weakness.    PT Comments    Pt presented with deficits in strength, transfers, mobility, gait, balance, and activity tolerance but actively participated throughout the session and made some progress towards goals.  Pt remained generally tremulous during the session with poor sitting and standing stability but grossly improved from previous session.  Pt continued to require assist to prevent LOB during amb but was much improved both in general stability and activity tolerance and was able to amb 80'.  Pt and nursing educated on R hand positioning to address finger flexion contracture.  Pt will benefit from PT services in a SNF setting upon discharge to safely address above deficits for decreased caregiver assistance and eventual return to PLOF.     Follow Up Recommendations  SNF     Equipment Recommendations  None recommended by PT    Recommendations for Other Services       Precautions / Restrictions Precautions Precautions: Fall Restrictions Weight Bearing Restrictions: No    Mobility  Bed Mobility Overal bed mobility: Needs Assistance Bed Mobility: Supine to Sit;Sit to Supine     Supine to sit: Min assist;Mod assist Sit to supine: Min assist;Mod assist   General bed mobility comments: Min to mod A for BLE and trunk control and to scoot anteriorly towards the EOB  Transfers Overall transfer level: Needs assistance Equipment used: Rolling walker (2 wheeled);Quad cane Transfers: Sit to/from Stand Sit to Stand: Mod assist;From elevated surface;Min assist         General transfer comment: Mod A to  stand from standard height surface and min A to stand from elevated surface with verbal cues for foot and hand positioning and general sequencing  Ambulation/Gait Ambulation/Gait assistance: Min assist Gait Distance (Feet): 60 Feet Assistive device: Rolling walker (2 wheeled) Gait Pattern/deviations: Step-through pattern;Decreased step length - right;Decreased step length - left;Shuffle Gait velocity: decreased   General Gait Details: Short B step length with poor clearance during swing through often shuffling feet forward with min A for stability and mod verbal cues for amb closer to the [employer] Mobility    Modified Rankin (Stroke Patients Only)       Balance Overall balance assessment: Needs assistance Sitting-balance support: Single extremity supported;Feet supported Sitting balance-Leahy Scale: Fair   Postural control: Posterior lean Standing balance support: Bilateral upper extremity supported Standing balance-Leahy Scale: Poor                              Cognition Arousal/Alertness: Lethargic Behavior During Therapy: Flat affect Overall Cognitive Status: No family/caregiver present to determine baseline cognitive functioning                                 General Comments: Pt lethargic but improved from previous session and became more alert as session progressed.      Exercises Total Joint Exercises Ankle Circles/Pumps: Strengthening;Both;10 reps;15 reps Quad Sets: Strengthening;Both;10 reps Towel Squeeze: Strengthening;Both;5 reps;10 reps Heel Slides: AROM;Strengthening;Both;10  reps Hip ABduction/ADduction: Strengthening;Both;5 reps;10 reps Straight Leg Raises: Strengthening;Both;5 reps;10 reps Long Arc Quad: Strengthening;Both;10 reps;15 reps Knee Flexion: Strengthening;Both;10 reps;15 reps Marching in Standing: AROM;Both;10 reps;Standing Other Exercises: gentle R hand finger ext AAROM and  stretching at end range to pt's tolerance Other Exercises: Nursing/pt education provided on R hand positioning with pt's hand placed around small towel roll to prevent finger flex contractures Other Exercises: Static and dynamic standing balance training with feet apart, together, and semi tandem with head still/head turns and reaching outside of BOS with occasional min to mod A for stability    General Comments        Pertinent Vitals/Pain Pain Assessment: No/denies pain    Home Living                      Prior Function            PT Goals (current goals can now be found in the care plan section) Progress towards PT goals: Progressing toward goals    Frequency    Min 2X/week      PT Plan Current plan remains appropriate    Co-evaluation              AM-PAC PT "6 Clicks" Mobility   Outcome Measure  Help needed turning from your back to your side while in a flat bed without using bedrails?: A Little Help needed moving from lying on your back to sitting on the side of a flat bed without using bedrails?: A Lot Help needed moving to and from a bed to a chair (including a wheelchair)?: A Lot Help needed standing up from a chair using your arms (e.g., wheelchair or bedside chair)?: A Lot Help needed to walk in hospital room?: A Lot Help needed climbing 3-5 steps with a railing? : A Lot 6 Click Score: 13    End of Session Equipment Utilized During Treatment: Gait belt Activity Tolerance: Patient tolerated treatment well Patient left: in bed;with nursing/sitter in room Nurse Communication: Mobility status PT Visit Diagnosis: Unsteadiness on feet (R26.81);Muscle weakness (generalized) (M62.81);Difficulty in walking, not elsewhere classified (R26.2)     Time: 0600-4599 PT Time Calculation (min) (ACUTE ONLY): 40 min  Charges:  $Gait Training: 8-22 mins $Therapeutic Exercise: 23-37 mins                     D. Scott  PT, DPT 11/02/19, 5:35 PM

## 2019-11-02 NOTE — ED Notes (Signed)
Pt given lunch tray. Juice and ensure was placed in a cup with a lid and straw d/t pt being unable to grasp with right hand with assist in opening his fingers.

## 2019-11-02 NOTE — ED Notes (Signed)
VOL  PENDING  PLACEMENT

## 2019-11-02 NOTE — ED Notes (Signed)
Pt ambulated to bathroom using walked and with this tech at side.

## 2019-11-02 NOTE — ED Notes (Signed)
Dinner tray provided

## 2019-11-02 NOTE — TOC Progression Note (Signed)
Transition of Care (TOC) - Progression Note    Patient Details  Name: Douglas Peterson. MRN: [id number] Date of Birth: [birth year]  Transition of Care [facility]) CM/SW [place], RN Phone Number: 11/02/2019, 1:44 PM  Clinical Narrative:     PASSR and FL2 complete.  PASSR #  [id number]    Expected Discharge Plan: [place]    Expected Discharge Plan and Services Expected Discharge Plan: [place] arrangements for the past 2 months: Single Family Home                                       Social Determinants of Health (SDOH) Interventions    Readmission Risk Interventions No flowsheet data found.

## 2019-11-02 NOTE — ED Notes (Signed)
Checked pt brief, was slightly wet, changed pt and got him comfortable in bed now asleep.

## 2019-11-02 NOTE — ED Notes (Signed)
Pt assisted in shower. Pt ambulated to shower using walker with assistance from this tech and Jeannette,RN. Pt is able to shower self with minimal assistance. Bed linen was changed. Pt ambulated back to rm using walker and assistance from this tech and RN. Pt laying in bed at this time with no further needs. Will continue ton monitor via q15 checks.

## 2019-11-02 NOTE — ED Notes (Signed)
Pt given ice water at this time.

## 2019-11-02 NOTE — NC FL2 (Signed)
[place] LEVEL OF CARE SCREENING TOOL     IDENTIFICATION  Patient Name: Douglas Peterson. Birthdate: 11-13-1950 Sex: male Admission Date (Current Location): 10/30/2019  [place] and [place] Number:  Engineering geologist and Address:  [facility], [address], [place], [place] [address]      Provider Number: [id number]  Attending Physician Name and Address:  No att. providers found  Relative Name and Phone Number:  [name] [phone]    Current Level of Care: Hospital Recommended Level of Care: Nursing Facility Prior Approval Number:    Date Approved/Denied:   PASRR Number: [id number]  Discharge Plan: SNF    Current Diagnoses: Patient Active Problem List   Diagnosis Date Noted  . Alcohol abuse   . Alcoholic intoxication with complication ([place])   . 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 Hypokalemia 09/27/2019  . Dysphagia 09/27/2019  . Alcohol withdrawal delirium, acute, hyperactive ([place]) 09/21/2019  . Pressure injury of skin 08/15/2019  . Goals of care, counseling/discussion   . Palliative care by specialist   . Alcohol withdrawal ([place]) 03/20/2019  . Low back pain 11/15/2018  . AMS (altered mental status) 10/12/2018  . Delirium tremens ([place]) 03/08/2018  . Malnutrition of moderate degree 11/24/2017  . HTN (hypertension) 09/16/2017  . Hypothermia 12/17/2016  . Alcohol use   . Diarrhea   . Hyponatremia 07/09/2015    Orientation RESPIRATION BLADDER Height & Weight     Self, Time, Situation, Place  Normal Incontinent Weight: 56.7 kg Height:  5' 4"  (162.6 cm)  BEHAVIORAL SYMPTOMS/MOOD NEUROLOGICAL BOWEL NUTRITION STATUS      Continent Diet(Regular)  AMBULATORY STATUS COMMUNICATION OF NEEDS Skin     Verbally Normal                       Personal Care Assistance Level of Assistance  Bathing, Dressing Bathing Assistance: Maximum assistance   Dressing Assistance:  Maximum assistance Total Care Assistance: Maximum assistance   Functional Limitations Info             SPECIAL CARE FACTORS FREQUENCY  PT (By licensed PT)     PT Frequency: 5x a week              Contractures Contractures Info: Not present    Additional Factors Info                  Current Medications (11/02/2019):  This is the current hospital active medication list Current Facility-Administered Medications  Medication Dose Route Frequency Provider Last Rate Last Admin  . ascorbic acid (VITAMIN C) tablet 1,000 mg  1,000 mg Oral Daily Earleen Newport, MD   1,000 mg at 11/02/19 1009  . aspirin EC tablet 81 mg  81 mg Oral Daily Earleen Newport, MD   81 mg at 11/02/19 1010  . atenolol (TENORMIN) tablet 25 mg  25 mg Oral QPM Earleen Newport, MD   25 mg at 11/01/19 1825  . baclofen (LIORESAL) tablet 10 mg  10 mg Oral TID Earleen Newport, MD   10 mg at 11/02/19 0944  . calcium citrate-vitamin D 500-500 MG-UNIT per chewable tablet 1 tablet  1 tablet Oral Daily Earleen Newport, MD   1 tablet at 11/02/19 1010  . chlordiazePOXIDE (LIBRIUM) capsule 25 mg  25 mg Oral BID Rulon Sera, MD   25 mg at 11/02/19 0945  . cholecalciferol (VITAMIN D3) tablet 5,000 Units  5,000 Units Oral Daily Earleen Newport, MD   5,000 Units at 11/02/19 [phone]  . doxepin (SINEQUAN) capsule 10 mg  10 mg Oral QHS PRN Earleen Newport, MD   10 mg at 11/01/19 2226  . feeding supplement (ENSURE ENLIVE) (ENSURE ENLIVE) liquid 237 mL  237 mL Oral BID BM Earleen Newport, MD   237 mL at 11/02/19 1332  . folic acid (FOLVITE) tablet 1 mg  1 mg Oral Daily Earleen Newport, MD   1 mg at 11/02/19 1009  . hydrOXYzine (ATARAX/VISTARIL) tablet 25 mg  25 mg Oral TID PRN Earleen Newport, MD      . multivitamin with minerals tablet 1 tablet  1 tablet Oral Daily Earleen Newport, MD   1 tablet at 11/02/19 [phone]  . PARoxetine (PAXIL) tablet 30 mg  30 mg Oral Daily Earleen Newport, MD   30 mg at 11/02/19 2863  . thiamine tablet 100 mg  100 mg Oral Daily Earleen Newport, MD   100 mg at 11/02/19 1009   Current Outpatient Medications  Medication Sig Dispense Refill  . aspirin EC 81 MG tablet Take 81 mg by mouth daily.    [name] atenolol (TENORMIN) 25 MG tablet Take 25 mg by mouth every evening.    . baclofen (LIORESAL) 10 MG tablet Take 10 mg by mouth 3 (three) times daily.    . Calcium Carbonate-Vit D-Min (CALCIUM 1200 PO) Take 1 tablet by mouth daily.    . Cholecalciferol (NAT-RUL VITAMIN D) 125 MCG (5000 UT) TABS Take 5,000 Units by mouth daily.    [name] doxepin (SINEQUAN) 10 MG capsule Take 10 mg by mouth at bedtime as needed for sleep.    . feeding supplement, ENSURE ENLIVE, (ENSURE ENLIVE) LIQD Take 237 mLs by mouth 2 (two) times daily between meals. 817 mL 12  . folic acid (FOLVITE) 711 MCG tablet Take 400 mcg by mouth daily.    . hydrOXYzine (ATARAX/VISTARIL) 25 MG tablet Take 25 mg by mouth 3 (three) times daily as needed.    . Multiple Vitamin (MULTIVITAMIN WITH MINERALS) TABS tablet Take 1 tablet by mouth daily. 30 tablet 0  . PARoxetine (PAXIL) 30 MG tablet Take 30 mg by mouth daily.   1  . thiamine 100 MG tablet Take 1 tablet (100 mg total) by mouth daily.    . vitamin C (ASCORBIC ACID) 500 MG tablet Take 1,000 mg by mouth daily.       Discharge Medications: Please see discharge summary for a list of discharge medications.  Relevant Imaging Results:  Relevant Lab Results:   Additional Information SS# [id number]  Victorino Dike, RN

## 2019-11-02 NOTE — BH Assessment (Signed)
Patient was initially being recommended for detox treatment for his excessive alcohol use. However, pt became agitated and requested to be discharged yesterday (11/01/2019). [name] nurse spoke to pt's daughter concerning his disposition. [name] daughter was not in agreement with pt's discharge/plan of care and became upset. Pt has been denied by several facilities due to being "too medically acute". Transportation (cab) was arranged for patient at the time of discharge; however, pt had an unsteady gait and appeared to be unable to safely travel home in the cab. Pt was then brought back into the ED. Per Dr. Lucilla Lame note (11/01/2019), SW/CM TOC has been consulted for this patient to assist with disposition.

## 2019-11-02 NOTE — ED Provider Notes (Signed)
-----------------------------------------   8:35 AM on 11/02/2019 -----------------------------------------   Blood pressure (!) 157/90, pulse 88, temperature 97.6 F (36.4 C), temperature source Oral, resp. rate 18, height 5' 4"  (1.626 m), weight 56.7 kg, SpO2 96 %.  The patient is calm and cooperative at this time.  There have been no acute events since the last update.  Awaiting disposition plan from Behavioral Medicine and/or Social Work team(s).   Carrie Mew, MD 11/02/19 [phone]

## 2019-11-02 NOTE — ED Notes (Signed)
Assumed care of patient. Patient awake and oriented. Patient with tremors to left hand but was able to grasp his breakfast sandwich and eat it. Right hand contracted, right foot weakness noted. Patient reports right side upper and lowe weakness due to past injuries. Patient utilized walker to ambulate to bathroom with assist. Was able to grasp left side of walker himself but needed assistance to open his fingers on right hand so that he can grasp the right side of walker, patient was able to ambulate with walker steadily. Patient Showered change of clean clothes provided, linen changed. Social worker at bedside to discuss further plan of care. Patient verbalized understanding and was in aggreement with plan of care. Awaiting PT and further plans. Patient denies SI/HI/HV. Safety maintained will monitor.

## 2019-11-02 NOTE — ED Notes (Signed)
Pt attempting to get up unassisted. Staff responded to patient and assisted him to use urinal. Pt having trouble standing unassisted and leaning against staff for heavy support when standing, this RN feels that without support pt would have fallen. Pt urinary output 265m.

## 2019-11-02 NOTE — ED Notes (Signed)
Pt given dinner tray.

## 2019-11-02 NOTE — ED Notes (Signed)
Dietary contacted to obtain a choc ensure at this time

## 2019-11-02 NOTE — ED Notes (Signed)
Lunch provide. Patient does well holding cups in his right hand with assist opening his fingers. Patient fingers get stuck/ contracted when not in use.

## 2019-11-02 NOTE — ED Notes (Signed)
Patient tolerated physical therapy well today.

## 2019-11-02 NOTE — TOC Initial Note (Signed)
Transition of Care (TOC) - Initial/Assessment Note    Patient Details  Name: Douglas Peterson. MRN: [id number] Date of Birth: 1950-12-01  [facility]) CM/SW Contact:    Anselm Pancoast, RN Phone Number: 11/02/2019, 12:53 PM  Clinical Narrative:                 Patient sitting in bed and requesting assistance for rehabilitation as well as help with detox from alcohol. Patient states he is worse than he has ever been and is unable to return home. Pt appears very weak and unsteady on his feet. Previous stay at [facility] and hoping to return there for short term rehab. Patient has 2 steps to enter home. Strong family support per patient. Independent with his cooking and cleaning when well however needs assistance with ADLs at this time. Will request PT consult.   Expected Discharge Plan: [place]     Patient Goals and CMS Choice Patient states their goals for this hospitalization and ongoing recovery are:: Get sober and return home      Expected Discharge Plan and Services Expected Discharge Plan: [place]       Living arrangements for the past 2 months: Single Family Home                                      Prior Living Arrangements/Services Living arrangements for the past 2 months: Single Family Home Lives with:: Self Patient language and need for interpreter reviewed:: Yes Do you feel safe going back to the place where you live?: No   Needs therapy for strengthening and help with alcoholism  Need for Family Participation in Patient Care: Yes (Comment) Care giver support system in place?: Yes (comment) Current home services: DME(walker, cane) Criminal Activity/Legal Involvement Pertinent to Current Situation/Hospitalization: No - Comment as needed  Activities of Daily Living Home Assistive Devices/Equipment: None ADL Screening (condition at time of admission) Patient's cognitive ability adequate to safely complete daily  activities?: Yes Is the patient deaf or have difficulty hearing?: No Does the patient have difficulty seeing, even when wearing glasses/contacts?: No Does the patient have difficulty concentrating, remembering, or making decisions?: No Patient able to express need for assistance with ADLs?: Yes Does the patient have difficulty dressing or bathing?: No Independently performs ADLs?: Yes (appropriate for developmental age) Does the patient have difficulty walking or climbing stairs?: No Weakness of Legs: None Weakness of Arms/Hands: None  Permission Sought/Granted Permission sought to share information with : Facility Art therapist granted to share information with : Yes, Verbal Permission Granted  Share Information with NAME: [name]     Permission granted to share info w Relationship: sister     Emotional Assessment Appearance:: Appears older than stated age Attitude/Demeanor/Rapport: Engaged Affect (typically observed): Accepting Orientation: : Oriented to Self, Oriented to Situation, Oriented to Place, Oriented to  Time Alcohol / Substance Use: Alcohol Use(Seeking Detox help) Psych Involvement: No (comment)  Admission diagnosis:  [place] Patient Active Problem List   Diagnosis Date Noted  . Alcohol abuse   . Alcoholic intoxication with complication ([place])   . 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 Hypokalemia 09/27/2019  . Dysphagia 09/27/2019  . Alcohol withdrawal delirium, acute, hyperactive ([place]) 09/21/2019  . Pressure injury of skin 08/15/2019  . Goals of care, counseling/discussion   . Palliative care by specialist   .  Alcohol withdrawal ([place]) 03/20/2019  . Low back pain 11/15/2018  . AMS (altered mental status) 10/12/2018  . Delirium tremens ([place]) 03/08/2018  . Malnutrition of moderate degree 11/24/2017  . HTN (hypertension) 09/16/2017  . Hypothermia 12/17/2016  . Alcohol use   . Diarrhea   . Hyponatremia  07/09/2015   PCP:  [name], MD Pharmacy:   [place], [place] - [facility] [address] [place] [address] Phone: [phone] Fax: [phone]     Social Determinants of Health (SDOH) Interventions    Readmission Risk Interventions No flowsheet data found.

## 2019-11-02 NOTE — Care Management (Signed)
RN CM: LVMM for daughter to call back to discuss potential SNF placement.

## 2019-11-03 NOTE — TOC Progression Note (Addendum)
Transition of Care (TOC) - Progression Note    Patient Details  Name: Nishawn Rotan. MRN: [id number] Date of Birth: [birth year]  Transition of Care [facility]) CM/SW Contact  Anselm Pancoast, RN Phone Number: 11/03/2019, 8:57 AM  Clinical Narrative:    Spoke with Otila Kluver @ [facility]. Anticipate transfer to SNF today. Bed offered-Tina @ SNF starting authorization for insurance approval.     Expected Discharge Plan: [place]    Expected Discharge Plan and Services Expected Discharge Plan: [place] arrangements for the past 2 months: Single Family Home                                       Social Determinants of Health (SDOH) Interventions    Readmission Risk Interventions No flowsheet data found.

## 2019-11-03 NOTE — ED Notes (Signed)
Report to include Situation, Background, Assessment, and Recommendations received from [facility]. Patient alert and oriented, warm and dry, in no acute distress. Patient denies SI, HI, AVH and pain. Patient made aware of Q15 minute rounds and Engineer, drilling presence for their safety. Patient instructed to come to me with needs or concerns.

## 2019-11-03 NOTE — ED Notes (Signed)
Pt asleep, meal tray placed in pt rm.

## 2019-11-03 NOTE — ED Notes (Signed)
Hourly rounding reveals patient in room. No complaints, stable, in no acute distress. Q15 minute rounds and monitoring via Engineer, drilling to continue.

## 2019-11-03 NOTE — ED Notes (Signed)
Pt assisted to toilet with walker. Pt had some leakage on his clothing, so his brief and his pants were changed. NAD noted.

## 2019-11-03 NOTE — Progress Notes (Signed)
Physical Therapy Treatment Patient Details Name: Douglas Peterson. MRN: [id number] DOB: [birth year] Today's Date: 11/03/2019    History of Present Illness Pt is a 68 year old Caucasian male who lives on his own, presented to the emergency department on a voluntary basis due to alcohol abuse.  PMH includes alcohol abuse, HTN, hyponatremia, kyphoplasty, and CVA with R-sided weakness.    PT Comments    Pt presented with deficits in strength, transfers, mobility, gait, balance, and activity tolerance.  Pt motivated and actively participated throughout the session.  Pt put forth very good effort with bed mobility tasks but ultimately required min A for final positioning.  Pt required min A to stand for stability but showed improved eccentric and concentric control during transfer training.  Pt continues to present with general tremors and min instability during amb but grossly improved this session.  Pt will benefit from PT services in a SNF setting upon discharge to safely address above deficits for decreased caregiver assistance and eventual return to PLOF.       Follow Up Recommendations  SNF     Equipment Recommendations  None recommended by PT    Recommendations for Other Services       Precautions / Restrictions Precautions Precautions: Fall Restrictions Weight Bearing Restrictions: No    Mobility  Bed Mobility Overal bed mobility: Needs Assistance Bed Mobility: Supine to Sit;Sit to Supine     Supine to sit: Min assist Sit to supine: Min assist   General bed mobility comments: Min A for trunk control  Transfers Overall transfer level: Needs assistance Equipment used: Rolling walker (2 wheeled) Transfers: Sit to/from Stand Sit to Stand: Min assist         General transfer comment: Min A for stability during sit to stand  Ambulation/Gait Ambulation/Gait assistance: Herbalist (Feet): 70 Feet Assistive device: Rolling walker (2 wheeled) Gait  Pattern/deviations: Step-through pattern;Decreased step length - right;Shuffle Gait velocity: decreased   General Gait Details: Short B step length with poor R foot clearance during swing through often shuffling the R foot with verbal cues needed for increased RLE step length   Stairs             Wheelchair Mobility    Modified Rankin (Stroke Patients Only)       Balance Overall balance assessment: Needs assistance Sitting-balance support: Single extremity supported;Feet supported Sitting balance-Leahy Scale: Fair     Standing balance support: Bilateral upper extremity supported;No upper extremity supported Standing balance-Leahy Scale: Poor Standing balance comment: Min A for stability in standing                            Cognition Arousal/Alertness: Awake/alert Behavior During Therapy: Flat affect Overall Cognitive Status: Within Functional Limits for tasks assessed                                        Exercises Other Exercises: standing mini squats 2 x 5 Other Exercises: Static and dynamic standing balance training with feet apart, together, and semi tandem with head still/head turns and reaching outside of BOS with occasional min to mod A for stability    General Comments        Pertinent Vitals/Pain Pain Assessment: No/denies pain    Home Living  Prior Function            PT Goals (current goals can now be found in the care plan section) Progress towards PT goals: Progressing toward goals    Frequency    Min 2X/week      PT Plan Current plan remains appropriate    Co-evaluation              AM-PAC PT "6 Clicks" Mobility   Outcome Measure  Help needed turning from your back to your side while in a flat bed without using bedrails?: A Little Help needed moving from lying on your back to sitting on the side of a flat bed without using bedrails?: A Little Help needed moving to  and from a bed to a chair (including a wheelchair)?: A Little Help needed standing up from a chair using your arms (e.g., wheelchair or bedside chair)?: A Little Help needed to walk in hospital room?: A Little Help needed climbing 3-5 steps with a railing? : A Lot 6 Click Score: 17    End of Session Equipment Utilized During Treatment: Gait belt Activity Tolerance: Patient tolerated treatment well Patient left: in bed;with nursing/sitter in room Nurse Communication: Mobility status PT Visit Diagnosis: Unsteadiness on feet (R26.81);Muscle weakness (generalized) (M62.81);Difficulty in walking, not elsewhere classified (R26.2)     Time: 8338-2505 PT Time Calculation (min) (ACUTE ONLY): 25 min  Charges:  $Gait Training: 8-22 mins $Therapeutic Exercise: 8-22 mins                     D. Scott  PT, DPT 11/03/19, 1:32 PM

## 2019-11-03 NOTE — ED Notes (Signed)
[name] dinner tray placed on bedside table.

## 2019-11-03 NOTE — ED Notes (Signed)
Pt given ensure drink and encouraged to drink it. It is still sitting on his bedside table.

## 2019-11-03 NOTE — ED Notes (Signed)
Report to include Situation, Background, Assessment, and Recommendations received from [facility]. Patient alert and oriented, warm and dry, in no acute distress. Patient denies SI, HI, AVH and pain. Patient made aware of Q15 minute rounds and Engineer, drilling presence for their safety. Patient instructed to come to me with needs or concerns.

## 2019-11-03 NOTE — TOC Progression Note (Signed)
Transition of Care (TOC) - Progression Note    Patient Details  Name: Douglas Peterson. MRN: [id number] Date of Birth: [birth year]  Transition of Care [facility]) CM/SW Contact  Anselm Pancoast, RN Phone Number: 11/03/2019, 5:10 PM  Clinical Narrative:    Discussed discharge plan with patient and daughter. Both in agreeance with [facility]. Advised SNF was working to get insurance authorization and patient could discharge to [facility] once approved. Daughter with questions regarding copays for SNF. CM advised her to discuss financial burden with SNF once approval received from insurance. No further questions at this time.    Expected Discharge Plan: [place]    Expected Discharge Plan and Services Expected Discharge Plan: [place] arrangements for the past 2 months: Single Family Home                                       Social Determinants of Health (SDOH) Interventions    Readmission Risk Interventions No flowsheet data found.

## 2019-11-03 NOTE — ED Notes (Signed)
Physical Therapy at bedside.

## 2019-11-03 NOTE — ED Provider Notes (Signed)
-----------------------------------------   6:31 AM on 11/03/2019 -----------------------------------------   Blood pressure (!) 143/87, pulse 97, temperature 97.6 F (36.4 C), temperature source Oral, resp. rate 18, height 5' 4"  (1.626 m), weight 56.7 kg, SpO2 97 %.  The patient is sleeping at this time.  There have been no acute events since the last update.  Awaiting disposition plan from Behavioral Medicine and/or Social Work team(s).   Paulette Blanch, MD 11/03/19 [phone]

## 2019-11-03 NOTE — ED Notes (Signed)
Pt asking for RN to come in and speak with him, RN notified

## 2019-11-03 NOTE — ED Notes (Signed)
Resumed care from [place], [place]. Pt resting comfortably.

## 2019-11-04 NOTE — ED Notes (Signed)
Pt brief changed d/t pt being soiled. Pt with no further complaints or needs at this time

## 2019-11-04 NOTE — ED Notes (Signed)
Hourly rounding reveals patient in room. No complaints, stable, in no acute distress. Q15 minute rounds and monitoring via Engineer, drilling to continue.

## 2019-11-04 NOTE — ED Notes (Signed)
VOL/Pending placement to SNF

## 2019-11-04 NOTE — ED Notes (Signed)
Patient went to bathroom and had medium BM, he ambulated with walker, [name] cooperative and calm, denies Si/hi or avh, Nurse will continue to monitor.

## 2019-11-04 NOTE — ED Notes (Signed)
Pt brief changed d/t pt being soiled. Pt demanding that he get the TV remote. Pt advised of time and that it is time for pt's to be asleep

## 2019-11-04 NOTE — ED Notes (Signed)
Patient ate 100% of supper and beverage, no signs of distress.

## 2019-11-04 NOTE — ED Notes (Signed)
Pt. Laying in bed watching tv.  Pt. Asked "what is the next step for me".  Pt. Told we were looking for detox facility for him.  Pt. Asked if he could use cell phone.  Pt. Told phone hours and if he needed to use his own cell phone to make a call he would be able to give him access.  Pt. Requested and was given soda.  Pt. Calm and cooperative.

## 2019-11-05 LAB — RESPIRATORY PANEL BY RT PCR (FLU A&B, COVID)
Influenza A by PCR: NEGATIVE
Influenza B by PCR: NEGATIVE
SARS Coronavirus 2 by RT PCR: NEGATIVE

## 2019-11-05 MED ORDER — ONDANSETRON 4 MG PO TBDP
4.0000 mg | ORAL_TABLET | Freq: Once | ORAL | Status: AC
  Administered 2019-11-05: 4 mg via ORAL
  Filled 2019-11-05: qty 1

## 2019-11-05 NOTE — ED Notes (Signed)
RN Sonja and Tech MG cleaned pt up,changed clothes, and Changed bedding.

## 2019-11-05 NOTE — ED Notes (Signed)
Hourly rounding reveals patient awake in room. No complaints, stable, in no acute distress. Q15 minute rounds and monitoring via Engineer, drilling to continue.

## 2019-11-05 NOTE — ED Notes (Signed)
Hourly rounding reveals patient sleeping in room. No complaints, stable, in no acute distress. Q15 minute rounds and monitoring via Engineer, drilling to continue.

## 2019-11-05 NOTE — ED Notes (Signed)
Report to include Situation, Background, Assessment, and Recommendations received from [facility]. Patient alert and oriented, warm and dry, in no acute distress. Patient denies SI, HI, AVH and pain. Patient made aware of Q15 minute rounds and Engineer, drilling presence for their safety. Patient instructed to come to me with needs or concerns.

## 2019-11-05 NOTE — ED Notes (Signed)
Meal tray provided.

## 2019-11-05 NOTE — ED Notes (Signed)
Report given to [employer]

## 2019-11-05 NOTE — ED Provider Notes (Signed)
-----------------------------------------   7:04 AM on 11/05/2019 -----------------------------------------   Blood pressure 133/81, pulse 82, temperature 97.6 F (36.4 C), temperature source Oral, resp. rate 18, height 5' 4"  (1.626 m), weight 56.7 kg, SpO2 96 %.  The patient had no acute events since last update.  Calm and cooperative at this time.  Disposition is pending per Psychiatry/Behavioral Medicine team recommendations.     Alfred Levins, [place], MD 11/05/19 [phone]

## 2019-11-05 NOTE — ED Notes (Signed)
Gave food tray with juice.

## 2019-11-06 NOTE — ED Notes (Signed)
Hourly rounding reveals patient sleeping in room. No complaints, stable, in no acute distress. Q15 minute rounds and monitoring via Engineer, drilling to continue.

## 2019-11-06 NOTE — ED Notes (Signed)
Assumed care of patient, patient awake laying in bed. Patient denies SI/HI/SI. Reports has not been out of bed. Patient encouraged to ask for assistance and to utilize his walker like he was taught by Physical therapy this week. Patient responded will get out of bed today and stretch his legs. Safety maintained. Will continue to monitor.

## 2019-11-06 NOTE — ED Notes (Signed)
Hourly rounding reveals patient awake in room. Stable, in no acute distress. Q15 minute rounds and monitoring via Engineer, drilling to continue.

## 2019-11-06 NOTE — ED Notes (Signed)
Dinner tray provided

## 2019-11-06 NOTE — ED Notes (Signed)
Patient up out of bed walking with walker assist to shower. Linen changed.

## 2019-11-06 NOTE — ED Provider Notes (Signed)
-----------------------------------------   6:03 AM on 11/06/2019 -----------------------------------------   Blood pressure 120/79, pulse 64, temperature (!) 97.5 F (36.4 C), temperature source Oral, resp. rate 16, height 1.626 m (5' 4" ), weight 56.7 kg, SpO2 98 %.  The patient is calm and cooperative at this time.  There have been no acute events since the last update.  Awaiting disposition plan from Behavioral Medicine and/or Social Work team(s).   Hinda Kehr, MD 11/06/19 [phone]

## 2019-11-07 NOTE — ED Provider Notes (Signed)
-----------------------------------------   9:13 AM on 11/07/2019 -----------------------------------------   Blood pressure 118/90, pulse 100, temperature (!) 97.5 F (36.4 C), temperature source Oral, resp. rate 17, height 5' 4"  (1.626 m), weight 56.7 kg, SpO2 96 %.  The patient is calm and cooperative at this time.  There have been no acute events since the last update.  Awaiting disposition plan from Behavioral Medicine and/or Social Work team(s).   Arta Silence, MD 11/07/19 [phone]

## 2019-11-07 NOTE — Progress Notes (Signed)
Physical Therapy Treatment Patient Details Name: Douglas Peterson. MRN: [id number] DOB: 1950/12/05 Today's Date: 11/07/2019    History of Present Illness Pt is a 69 year old Caucasian male who lives on his own, presented to the emergency department on a voluntary basis due to alcohol abuse.  PMH includes alcohol abuse, HTN, hyponatremia, kyphoplasty, and CVA with R-sided weakness.    PT Comments    Pt ready for session.  Bed mobility with min a x 1.  He is able to stand and walk 250' with RW and min a x 1.  Short step height and length with shuffle.  Needs assist to place RUE on and off walker.  Balance remains poor with hand on assist needed at all times.  Pt inc urine needing full clothing and bed change.  Care provided with bathing as appropriate.  Noted red area mid back stage 1.  RN notified.     Follow Up Recommendations  SNF     Equipment Recommendations  None recommended by PT    Recommendations for Other Services       Precautions / Restrictions Precautions Precautions: Fall Restrictions Weight Bearing Restrictions: No    Mobility  Bed Mobility Overal bed mobility: Needs Assistance Bed Mobility: Supine to Sit;Sit to Supine     Supine to sit: Min assist     General bed mobility comments: Min A for trunk control  Transfers Overall transfer level: Needs assistance Equipment used: Rolling walker (2 wheeled) Transfers: Sit to/from Stand Sit to Stand: Min assist         General transfer comment: Min A for stability during sit to stand  Ambulation/Gait Ambulation/Gait assistance: Herbalist (Feet): 250 Feet Assistive device: Rolling walker (2 wheeled)   Gait velocity: decreased   General Gait Details: Short B step length with poor R foot clearance during swing through often shuffling the R foot with verbal cues needed for increased RLE step length   Stairs             Wheelchair Mobility    Modified Rankin (Stroke Patients Only)       Balance Overall balance assessment: Needs assistance Sitting-balance support: Single extremity supported;Feet supported Sitting balance-Leahy Scale: Fair   Postural control: Posterior lean Standing balance support: Bilateral upper extremity supported;No upper extremity supported Standing balance-Leahy Scale: Poor Standing balance comment: Min A for stability in standing                            Cognition Arousal/Alertness: Awake/alert Behavior During Therapy: Flat affect Overall Cognitive Status: Within Functional Limits for tasks assessed                                        Exercises      General Comments        Pertinent Vitals/Pain Pain Assessment: No/denies pain    Home Living                      Prior Function            PT Goals (current goals can now be found in the care plan section) Progress towards PT goals: Progressing toward goals    Frequency    Min 2X/week      PT Plan Current plan remains appropriate    Co-evaluation  AM-PAC PT "6 Clicks" Mobility   Outcome Measure  Help needed turning from your back to your side while in a flat bed without using bedrails?: A Little Help needed moving from lying on your back to sitting on the side of a flat bed without using bedrails?: A Little Help needed moving to and from a bed to a chair (including a wheelchair)?: A Little Help needed standing up from a chair using your arms (e.g., wheelchair or bedside chair)?: A Little Help needed to walk in hospital room?: A Little Help needed climbing 3-5 steps with a railing? : A Lot 6 Click Score: 17    End of Session Equipment Utilized During Treatment: Gait belt Activity Tolerance: Patient tolerated treatment well Patient left: in bed;with bed alarm set Nurse Communication: Mobility status       Time: 1405-1430 PT Time Calculation (min) (ACUTE ONLY): 25 min  Charges:  $Gait Training:  8-22 mins $Therapeutic Activity: 8-22 mins                    Chesley Noon, PTA 11/07/19, 4:31 PM

## 2019-11-07 NOTE — ED Provider Notes (Signed)
-----------------------------------------   4:05 PM on 11/07/2019 -----------------------------------------  Blood pressure 118/90, pulse 100, temperature (!) 97.5 F (36.4 C), temperature source Oral, resp. rate 17, height 5' 4"  (1.626 m), weight 56.7 kg, SpO2 96 %.  The patient is calm and cooperative at this time.  There have been no acute events since the last update.  Awaiting disposition plan from Behavioral Medicine team.  Patient accepted for [facility] at [facility].   Blake Divine, MD 11/07/19 [phone]

## 2019-11-07 NOTE — Care Management (Addendum)
RN CM: Incoming call from [employer] continues to be working on [facility] for SNF transfer. RN CM advised patient remains ready for discharge. Tammy states she will outreach to [facility] again today for follow up.   RN CM: Incoming call from [employer] @ [employer] approval received and patient to admit to room 608 via EMS. Report to be called in to 600 hall nurse.

## 2019-11-07 NOTE — ED Notes (Signed)
Report called to [facility] 600 hall RN.  RN familiar with this patient due to previous admissions.

## 2019-11-07 NOTE — TOC Progression Note (Signed)
Transition of Care (TOC) - Progression Note    Patient Details  Name: Douglas Peterson. MRN: [id number] Date of Birth: [birth year]  Transition of Care [facility]) CM/SW Contact  Anselm Pancoast, RN Phone Number: 11/07/2019, 8:44 AM  Clinical Narrative:    Call to SNF, LVMM for Tammy in admissions for follow up on SNF authorization from [place].    Expected Discharge Plan: [place]    Expected Discharge Plan and Services Expected Discharge Plan: [place] arrangements for the past 2 months: Single Family Home                                       Social Determinants of Health (SDOH) Interventions    Readmission Risk Interventions No flowsheet data found.

## 2019-11-07 NOTE — ED Notes (Signed)
Pt discharged to [facility].  VS stable. Pt denies SI/HI. All belongings sent with EMS. Report given to 600 hall RN.  Pt accepting and cooperative.

## 2019-11-26 ENCOUNTER — Other Ambulatory Visit: Payer: Self-pay

## 2019-11-26 ENCOUNTER — Emergency Department: Payer: Medicare HMO

## 2019-11-26 ENCOUNTER — Emergency Department
Admission: EM | Admit: 2019-11-26 | Discharge: 2019-11-27 | Disposition: A | Discharge status: Home / Self Care | Payer: Medicare HMO | Attending: Emergency Medicine | Admitting: Emergency Medicine

## 2019-11-26 DIAGNOSIS — Z20822 Contact with and (suspected) exposure to covid-19: Secondary | ICD-10-CM | POA: Diagnosis not present

## 2019-11-26 DIAGNOSIS — I10 Essential (primary) hypertension: Secondary | ICD-10-CM | POA: Diagnosis not present

## 2019-11-26 DIAGNOSIS — F1020 Alcohol dependence, uncomplicated: Secondary | ICD-10-CM | POA: Insufficient documentation

## 2019-11-26 DIAGNOSIS — Z87891 Personal history of nicotine dependence: Secondary | ICD-10-CM | POA: Insufficient documentation

## 2019-11-26 DIAGNOSIS — R531 Weakness: Secondary | ICD-10-CM | POA: Diagnosis not present

## 2019-11-26 DIAGNOSIS — F10920 Alcohol use, unspecified with intoxication, uncomplicated: Secondary | ICD-10-CM

## 2019-11-26 DIAGNOSIS — Y908 Blood alcohol level of 240 mg/100 ml or more: Secondary | ICD-10-CM | POA: Insufficient documentation

## 2019-11-26 DIAGNOSIS — F10929 Alcohol use, unspecified with intoxication, unspecified: Secondary | ICD-10-CM | POA: Diagnosis present

## 2019-11-26 LAB — COMPREHENSIVE METABOLIC PANEL
ALT: 32 U/L (ref 0–44)
AST: 44 U/L — ABNORMAL HIGH (ref 15–41)
Albumin: 4.4 g/dL (ref 3.5–5.0)
Alkaline Phosphatase: 107 U/L (ref 38–126)
Anion gap: 14 (ref 5–15)
BUN: <5 mg/dL — ABNORMAL LOW (ref 8–23)
CO2: 27 mmol/L (ref 22–32)
Calcium: 8.7 mg/dL — ABNORMAL LOW (ref 8.9–10.3)
Chloride: 98 mmol/L (ref 98–111)
Creatinine, Ser: 0.41 mg/dL — ABNORMAL LOW (ref 0.61–1.24)
GFR calc Af Amer: >60 mL/min (ref >60)
GFR calc non Af Amer: >60 mL/min (ref >60)
Glucose, Bld: 137 mg/dL — ABNORMAL HIGH (ref 70–99)
Potassium: 3.4 mmol/L — ABNORMAL LOW (ref 3.5–5.1)
Sodium: 139 mmol/L (ref 135–145)
Total Bilirubin: 0.8 mg/dL (ref 0.3–1.2)
Total Protein: 7.7 g/dL (ref 6.5–8.1)

## 2019-11-26 LAB — CBC
HCT: 54.4 % — ABNORMAL HIGH (ref 39.0–52.0)
Hemoglobin: 18.5 g/dL — ABNORMAL HIGH (ref 13.0–17.0)
MCH: 31.1 pg (ref 26.0–34.0)
MCHC: 34 g/dL (ref 30.0–36.0)
MCV: 91.6 fL (ref 80.0–100.0)
Platelets: 174 10*3/uL (ref 150–400)
RBC: 5.94 MIL/uL — ABNORMAL HIGH (ref 4.22–5.81)
RDW: 13.8 % (ref 11.5–15.5)
WBC: 14.5 10*3/uL — ABNORMAL HIGH (ref 4.0–10.5)
nRBC: 0.3 % — ABNORMAL HIGH (ref 0.0–0.2)

## 2019-11-26 LAB — URINE DRUG SCREEN, QUALITATIVE (ARMC ONLY)
Amphetamines, Ur Screen: NOT DETECTED
Barbiturates, Ur Screen: NOT DETECTED
Benzodiazepine, Ur Scrn: NOT DETECTED
Cannabinoid 50 Ng, Ur ~~LOC~~: NOT DETECTED
Cocaine Metabolite,Ur ~~LOC~~: NOT DETECTED
MDMA (Ecstasy)Ur Screen: NOT DETECTED
Methadone Scn, Ur: NOT DETECTED
Opiate, Ur Screen: NOT DETECTED
Phencyclidine (PCP) Ur S: NOT DETECTED
Tricyclic, Ur Screen: NOT DETECTED

## 2019-11-26 LAB — ETHANOL: Alcohol, Ethyl (B): 265 mg/dL — ABNORMAL HIGH (ref <10)

## 2019-11-26 LAB — TROPONIN I (HIGH SENSITIVITY): Troponin I (High Sensitivity): 5 ng/L (ref <18)

## 2019-11-26 LAB — RESPIRATORY PANEL BY RT PCR (FLU A&B, COVID)
Influenza A by PCR: NEGATIVE
Influenza B by PCR: NEGATIVE
SARS Coronavirus 2 by RT PCR: NEGATIVE

## 2019-11-26 LAB — CK: Total CK: 326 U/L (ref 49–397)

## 2019-11-26 MED ORDER — ADULT MULTIVITAMIN W/MINERALS CH
1.0000 | ORAL_TABLET | Freq: Every day | ORAL | Status: DC
  Administered 2019-11-27: 09:00:00 1 via ORAL
  Filled 2019-11-26: qty 1

## 2019-11-26 MED ORDER — SODIUM CHLORIDE 0.9 % IV SOLN: Freq: Once | INTRAVENOUS | Status: AC

## 2019-11-26 MED ORDER — BACLOFEN 10 MG PO TABS
10.0000 mg | ORAL_TABLET | Freq: Three times a day (TID) | ORAL | Status: DC
  Administered 2019-11-27: 10 mg via ORAL
  Filled 2019-11-26 (×4): qty 1

## 2019-11-26 MED ORDER — PAROXETINE HCL 30 MG PO TABS
30.0000 mg | ORAL_TABLET | Freq: Every day | ORAL | Status: DC
  Administered 2019-11-27: 30 mg via ORAL
  Filled 2019-11-26: qty 1

## 2019-11-26 MED ORDER — ASPIRIN EC 81 MG PO TBEC
81.0000 mg | DELAYED_RELEASE_TABLET | Freq: Every day | ORAL | Status: DC
  Administered 2019-11-27: 81 mg via ORAL
  Filled 2019-11-26: qty 1

## 2019-11-26 MED ORDER — ASCORBIC ACID 500 MG PO TABS
1000.0000 mg | ORAL_TABLET | Freq: Every day | ORAL | Status: DC
  Administered 2019-11-27: 09:00:00 1000 mg via ORAL
  Filled 2019-11-26: qty 2

## 2019-11-26 MED ORDER — THIAMINE HCL 100 MG PO TABS
100.0000 mg | ORAL_TABLET | Freq: Every day | ORAL | Status: DC
  Administered 2019-11-27: 100 mg via ORAL
  Filled 2019-11-26: qty 1

## 2019-11-26 MED ORDER — ENSURE ENLIVE PO LIQD
237.0000 mL | Freq: Two times a day (BID) | ORAL | Status: DC
  Administered 2019-11-27: 15:00:00 237 mL via ORAL

## 2019-11-26 MED ORDER — LORAZEPAM 2 MG/ML IJ SOLN
1.0000 mg | Freq: Once | INTRAMUSCULAR | Status: AC
  Administered 2019-11-26: 1 mg via INTRAVENOUS
  Filled 2019-11-26: qty 1

## 2019-11-26 MED ORDER — ATENOLOL 25 MG PO TABS
25.0000 mg | ORAL_TABLET | Freq: Every evening | ORAL | Status: DC
  Administered 2019-11-26: 25 mg via ORAL
  Filled 2019-11-26: qty 1

## 2019-11-26 MED ORDER — HYDROXYZINE HCL 25 MG PO TABS
25.0000 mg | ORAL_TABLET | Freq: Three times a day (TID) | ORAL | Status: DC | PRN
  Administered 2019-11-26: 25 mg via ORAL
  Filled 2019-11-26: qty 1

## 2019-11-26 MED ORDER — FOLIC ACID 1 MG PO TABS
500.0000 ug | ORAL_TABLET | Freq: Every day | ORAL | Status: DC
  Administered 2019-11-27: 09:00:00 0.5 mg via ORAL
  Filled 2019-11-26: qty 1
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
## 2019-11-26 NOTE — ED Triage Notes (Signed)
Pt comes EMS after a wellbeing check was called. Pt has had somewhere around 50 beers today. Pt has voided himself and defecated on himself. Pt awake and mostly oriented. Pt daughter lives in [place] and does not take care of him. Pt requests detox and wants to stop drinking. Pt does drink every day.

## 2019-11-26 NOTE — ED Notes (Signed)
TTS talking to patient via video feed

## 2019-11-26 NOTE — ED Notes (Signed)
Pt. Was able to talk to NP and TTS via video feed from [employer].

## 2019-11-26 NOTE — ED Notes (Signed)
Pt has been stuck 8 times for blood recollection. Lab notified that pt needs to be stuck.

## 2019-11-26 NOTE — ED Notes (Signed)
Pt. Requesting assistance in detox from alcohol.

## 2019-11-26 NOTE — ED Notes (Signed)
Pt. Laying in bed 21A.  ER doctor was able to start EJ and blood work was sent.  Pt. States he if feeling anxious.  Pt. Made comfortable with adjustments to bed.

## 2019-11-26 NOTE — ED Notes (Signed)
Pt cleansed of stool and urine. New clothes applied. New linens. Pt bathed in warm wipes. Pt calm and cooperative.
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
## 2019-11-27 DIAGNOSIS — F10920 Alcohol use, unspecified with intoxication, uncomplicated: Secondary | ICD-10-CM

## 2019-11-27 MED ORDER — DIPHENHYDRAMINE HCL 50 MG/ML IJ SOLN
50.0000 mg | Freq: Once | INTRAMUSCULAR | Status: AC
  Administered 2019-11-27: 50 mg via INTRAVENOUS
  Filled 2019-11-27: qty 1
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
## 2019-11-27 NOTE — Care Management (Signed)
TOC RN CM: Took patient clean clothes for discharge home due to his admission clothes being soiled. Patient advised he would need his cane or walker to discharge. RN CM obtained cane and walker from storage and patient is not stable to discharge home in cab. Patient states he is at his baseline however it was obvious patient would not be safe to navigate steps into home and patient does not have garage door opener to get into side door with fewer steps.   RN CM outreached to daughter to update on change in discharge and request family transport patient to his home. [name] advised family is not interested in assisting this patient any further and he would need to pay for EMS to transport to his home. RN CM notified nurse of need for EMS transport to home.

## 2019-11-27 NOTE — Discharge Instructions (Signed)
[facility] Non-profit organization in [place], [place] Address: [address], [place], [place] [address] Hours:  Closed ? Opens 9AM Mon Phone: [phone]    Alcohol Abuse and Dependence Information, Adult Alcohol is a widely available drug. People drink alcohol in different amounts. People who drink alcohol very often and in large amounts often have problems during and after drinking. They may develop what is called an alcohol use disorder. There are two main types of alcohol use disorders:  Alcohol abuse. This is when you use alcohol too much or too often. You may use alcohol to make yourself feel happy or to reduce stress. You may have a hard time setting a limit on the amount you drink.  Alcohol dependence. This is when you use alcohol consistently for a period of time, and your body changes as a result. This can make it hard to stop drinking because you may start to feel sick or feel different when you do not use alcohol. These symptoms are known as withdrawal. How can alcohol abuse and dependence affect me? Alcohol abuse and dependence can have a negative effect on your life. Drinking too much can lead to addiction. You may feel like you need alcohol to function normally. You may drink alcohol before work in the morning, during the day, or as soon as you get home from work in the evening. These actions can result in:  Poor work performance.  Job loss.  Financial problems.  Car crashes or criminal charges from driving after drinking alcohol.  Problems in your relationships with friends and family.  Losing the trust and respect of coworkers, friends, and family. Drinking heavily over a long period of time can permanently damage your body and brain, and can cause lifelong health issues, such as:  Damage to your liver or pancreas.  Heart problems, high blood pressure, or stroke.  Certain cancers.  Decreased ability to fight  infections.  Brain or nerve damage.  Depression.  Early (premature) death. If you are careless or you crave alcohol, it is easy to drink more than your body can handle (overdose). Alcohol overdose is a serious situation that requires hospitalization. It may lead to permanent injuries or death. What can increase my risk?  Having a family history of alcohol abuse.  Having depression or other mental health conditions.  Beginning to drink at an early age.  Binge drinking often.  Experiencing trauma, stress, and an unstable home life during childhood.  Spending time with people who drink often. What actions can I take to prevent or manage alcohol abuse and dependence?  Do not drink alcohol if: ? Your health care provider tells you not to drink. ? You are pregnant, may be pregnant, or are planning to become pregnant.  If you drink alcohol: ? Limit how much you use to:  0-1 drink a day for women.  0-2 drinks a day for men. ? Be aware of how much alcohol is in your drink. In the [place] one drink equals one 12 oz bottle of beer (355 mL), one 5 oz glass of wine (148 mL), or one 1 oz glass of hard liquor (44 mL).  Stop drinking if you have been drinking too much. This can be very hard to do if you are used to abusing alcohol. If you begin to have withdrawal symptoms, talk with your health care provider or a person that you trust. These symptoms may include anxiety, shaky hands, headache, nausea, sweating, or  not being able to sleep.  Choose to drink nonalcoholic beverages in social gatherings and places where there may be alcohol. Activity  Spend more time on activities that you enjoy that do not involve alcohol, like hobbies or exercise.  Find healthy ways to cope with stress, such as exercise, meditation, or spending time with people you care about. General information  Talk to your family, coworkers, and friends about supporting you in your efforts to stop drinking. If they  drink, ask them not to drink around you. Spend more time with people who do not drink alcohol.  If you think that you have an alcohol dependency problem: ? Tell friends or family about your concerns. ? Talk with your health care provider or another health professional about where to get help. ? Work with a Transport planner and a Regulatory affairs officer. ? Consider joining a support group for people who struggle with alcohol abuse and dependence. Where to find support   Your health care provider.  SMART Recovery: www.smartrecovery.org Therapy and support groups  Local treatment centers or chemical dependency counselors.  Local AA groups in your community: NicTax.com.pt Where to find more information  Centers for Disease Control and Prevention: http://www.wolf.info/  National Institute on Alcohol Abuse and Alcoholism: http://www.bradshaw.com/  Alcoholics Anonymous (AA): NicTax.com.pt Contact a health care provider if:  You drank more or for longer than you intended on more than one occasion.  You tried to stop drinking or to cut back on how much you drink, but you were not able to.  You often drink to the point of vomiting or passing out.  You want to drink so badly that you cannot think about anything else.  You have problems in your life due to drinking, but you continue to drink.  You keep drinking even though you feel anxious, depressed, or have experienced memory loss.  You have stopped doing the things you used to enjoy in order to drink.  You have to drink more than you used to in order to get the effect you want.  You experience anxiety, sweating, nausea, shakiness, and trouble sleeping when you try to stop drinking. Get help right away if:  You have thoughts about hurting yourself or others.  You have serious withdrawal symptoms, including: ? Confusion. ? Racing heart. ? High blood pressure. ? Fever. If you ever feel like you may hurt yourself or others, or have thoughts about  taking your own life, get help right away. You can go to your nearest emergency department or call:  Your local emergency services (911 in the [place]  A suicide crisis helpline, such as the [place] at (430)532-6439. This is open 24 hours a day. Summary  Alcohol abuse and dependence can have a negative effect on your life. Drinking too much or too often can lead to addiction.  If you drink alcohol, limit how much you use.  If you are having trouble keeping your drinking under control, find ways to change your behavior. Hobbies, calming activities, exercise, or support groups can help.  If you feel you need help with changing your drinking habits, talk with your health care provider, a good friend, or a therapist, or go to an [place] group. This information is not intended to replace advice given to you by your health care provider. Make sure you discuss any questions you have with your health care provider. Document Revised: 02/08/2019 Document Reviewed: 12/28/2018 Elsevier Patient Education  [place].

## 2019-11-27 NOTE — Care Management (Addendum)
TOC RN CM: Patient returned to ED due to alcoholism needing placement. Previously placed at [facility] for SNF services x2 which result in him discharging himself to return home and drink. Patient requesting placement assistance at this time for detox facility.

## 2019-11-27 NOTE — ED Notes (Signed)
VOL, pend SW placement

## 2019-11-27 NOTE — ED Notes (Signed)
Pt given meal tray with a spoon and a fork.

## 2019-11-27 NOTE — ED Notes (Signed)
Pt discharged home via EMS.  VS stable.  All belongings returned to patient. Discharge instructions given to EMS. Pt denies SI/HI.

## 2019-12-06 ENCOUNTER — Encounter: Payer: Self-pay | Admitting: *Deleted

## 2019-12-06 ENCOUNTER — Inpatient Hospital Stay
Admission: EM | Admit: 2019-12-06 | Discharge: 2019-12-13 | DRG: 897 | Disposition: A | Discharge status: Skilled Nursing Facility | Payer: Medicare HMO | Attending: Internal Medicine | Admitting: Internal Medicine

## 2019-12-06 ENCOUNTER — Emergency Department: Payer: Medicare HMO

## 2019-12-06 ENCOUNTER — Other Ambulatory Visit: Payer: Self-pay

## 2019-12-06 DIAGNOSIS — Z87891 Personal history of nicotine dependence: Secondary | ICD-10-CM

## 2019-12-06 DIAGNOSIS — F10939 Alcohol use, unspecified with withdrawal, unspecified: Secondary | ICD-10-CM | POA: Diagnosis present

## 2019-12-06 DIAGNOSIS — Z20822 Contact with and (suspected) exposure to covid-19: Secondary | ICD-10-CM | POA: Diagnosis present

## 2019-12-06 DIAGNOSIS — R531 Weakness: Secondary | ICD-10-CM

## 2019-12-06 DIAGNOSIS — Z813 Family history of other psychoactive substance abuse and dependence: Secondary | ICD-10-CM

## 2019-12-06 DIAGNOSIS — Z801 Family history of malignant neoplasm of trachea, bronchus and lung: Secondary | ICD-10-CM

## 2019-12-06 DIAGNOSIS — Z8249 Family history of ischemic heart disease and other diseases of the circulatory system: Secondary | ICD-10-CM

## 2019-12-06 DIAGNOSIS — F10929 Alcohol use, unspecified with intoxication, unspecified: Secondary | ICD-10-CM

## 2019-12-06 DIAGNOSIS — I69351 Hemiplegia and hemiparesis following cerebral infarction affecting right dominant side: Secondary | ICD-10-CM

## 2019-12-06 DIAGNOSIS — R001 Bradycardia, unspecified: Secondary | ICD-10-CM | POA: Diagnosis not present

## 2019-12-06 DIAGNOSIS — I959 Hypotension, unspecified: Secondary | ICD-10-CM | POA: Diagnosis not present

## 2019-12-06 DIAGNOSIS — F10231 Alcohol dependence with withdrawal delirium: Secondary | ICD-10-CM | POA: Diagnosis present

## 2019-12-06 DIAGNOSIS — I10 Essential (primary) hypertension: Secondary | ICD-10-CM | POA: Diagnosis present

## 2019-12-06 DIAGNOSIS — F10230 Alcohol dependence with withdrawal, uncomplicated: Secondary | ICD-10-CM

## 2019-12-06 DIAGNOSIS — F10930 Alcohol use, unspecified with withdrawal, uncomplicated: Secondary | ICD-10-CM

## 2019-12-06 DIAGNOSIS — M6282 Rhabdomyolysis: Secondary | ICD-10-CM | POA: Diagnosis present

## 2019-12-06 DIAGNOSIS — R251 Tremor, unspecified: Secondary | ICD-10-CM

## 2019-12-06 DIAGNOSIS — D61818 Other pancytopenia: Secondary | ICD-10-CM

## 2019-12-06 DIAGNOSIS — E876 Hypokalemia: Secondary | ICD-10-CM | POA: Diagnosis present

## 2019-12-06 DIAGNOSIS — F10931 Alcohol use, unspecified with withdrawal delirium: Secondary | ICD-10-CM | POA: Diagnosis present

## 2019-12-06 DIAGNOSIS — F10239 Alcohol dependence with withdrawal, unspecified: Secondary | ICD-10-CM | POA: Diagnosis present

## 2019-12-06 DIAGNOSIS — K7460 Unspecified cirrhosis of liver: Secondary | ICD-10-CM | POA: Diagnosis present

## 2019-12-06 DIAGNOSIS — F10229 Alcohol dependence with intoxication, unspecified: Secondary | ICD-10-CM | POA: Diagnosis present

## 2019-12-06 LAB — CBC WITH DIFFERENTIAL/PLATELET
Abs Immature Granulocytes: 0.03 10*3/uL (ref 0.00–0.07)
Basophils Absolute: 0.1 10*3/uL (ref 0.0–0.1)
Basophils Relative: 1 %
Eosinophils Absolute: 0.1 10*3/uL (ref 0.0–0.5)
Eosinophils Relative: 1 %
HCT: 50 % (ref 39.0–52.0)
Hemoglobin: 16.9 g/dL (ref 13.0–17.0)
Immature Granulocytes: 0 %
Lymphocytes Relative: 55 %
Lymphs Abs: 6.2 10*3/uL — ABNORMAL HIGH (ref 0.7–4.0)
MCH: 31 pg (ref 26.0–34.0)
MCHC: 33.8 g/dL (ref 30.0–36.0)
MCV: 91.7 fL (ref 80.0–100.0)
Monocytes Absolute: 0.6 10*3/uL (ref 0.1–1.0)
Monocytes Relative: 6 %
Neutro Abs: 4 10*3/uL (ref 1.7–7.7)
Neutrophils Relative %: 37 %
Platelets: 137 10*3/uL — ABNORMAL LOW (ref 150–400)
RBC: 5.45 MIL/uL (ref 4.22–5.81)
RDW: 13.2 % (ref 11.5–15.5)
WBC: 11 10*3/uL — ABNORMAL HIGH (ref 4.0–10.5)
nRBC: 0.2 % (ref 0.0–0.2)

## 2019-12-06 LAB — COMPREHENSIVE METABOLIC PANEL
ALT: 38 U/L (ref 0–44)
AST: 62 U/L — ABNORMAL HIGH (ref 15–41)
Albumin: 4.8 g/dL (ref 3.5–5.0)
Alkaline Phosphatase: 122 U/L (ref 38–126)
Anion gap: 16 — ABNORMAL HIGH (ref 5–15)
BUN: <5 mg/dL — ABNORMAL LOW (ref 8–23)
CO2: 30 mmol/L (ref 22–32)
Calcium: 8.8 mg/dL — ABNORMAL LOW (ref 8.9–10.3)
Chloride: 91 mmol/L — ABNORMAL LOW (ref 98–111)
Creatinine, Ser: 0.43 mg/dL — ABNORMAL LOW (ref 0.61–1.24)
GFR calc Af Amer: >60 mL/min (ref >60)
GFR calc non Af Amer: >60 mL/min (ref >60)
Glucose, Bld: 108 mg/dL — ABNORMAL HIGH (ref 70–99)
Potassium: 3.7 mmol/L (ref 3.5–5.1)
Sodium: 137 mmol/L (ref 135–145)
Total Bilirubin: 0.6 mg/dL (ref 0.3–1.2)
Total Protein: 8.6 g/dL — ABNORMAL HIGH (ref 6.5–8.1)

## 2019-12-06 LAB — URINALYSIS, COMPLETE (UACMP) WITH MICROSCOPIC
Bacteria, UA: NONE SEEN
Bilirubin Urine: NEGATIVE
Glucose, UA: NEGATIVE mg/dL
Hgb urine dipstick: NEGATIVE
Ketones, ur: NEGATIVE mg/dL
Leukocytes,Ua: NEGATIVE
Nitrite: NEGATIVE
Protein, ur: NEGATIVE mg/dL
Specific Gravity, Urine: 1.002 — ABNORMAL LOW (ref 1.005–1.030)
Squamous Epithelial / HPF: NONE SEEN (ref 0–5)
pH: 6 (ref 5.0–8.0)

## 2019-12-06 LAB — ETHANOL: Alcohol, Ethyl (B): 398 mg/dL (ref <10)

## 2019-12-06 MED ORDER — PROCHLORPERAZINE EDISYLATE 10 MG/2ML IJ SOLN
10.0000 mg | Freq: Once | INTRAMUSCULAR | Status: AC
  Administered 2019-12-06: 10 mg via INTRAVENOUS
  Filled 2019-12-06: qty 2

## 2019-12-06 MED ORDER — DIPHENHYDRAMINE HCL 25 MG PO CAPS
25.0000 mg | ORAL_CAPSULE | Freq: Once | ORAL | Status: AC
  Administered 2019-12-06: 23:00:00 25 mg via ORAL
  Filled 2019-12-06: qty 1

## 2019-12-06 MED ORDER — THIAMINE HCL 100 MG/ML IJ SOLN
100.0000 mg | Freq: Once | INTRAMUSCULAR | Status: AC
  Administered 2019-12-06: 100 mg via INTRAVENOUS
  Filled 2019-12-06: qty 2

## 2019-12-06 NOTE — ED Notes (Signed)
Pt continues calling out stating, "I am sick" MD made aware. Pt requesting medications for his pain and nausea.

## 2019-12-06 NOTE — ED Triage Notes (Signed)
Pt to ED from home reporting feeling "like I am was going to die." Pt reports feeling too weak to walk today and presents to the ED covered in urine after reporting he felt too weak to walk to the bathroom. Diarrhea and intermittent nausea without abd pain x 3 days. Pt has hx of ETOH abuse and reports having had 12 beers  today. No liqueur or drugs. PT reporting to EMS that he wants detox.   EMS vitals:   130/80 HR 98 96% on RA CBG 126

## 2019-12-06 NOTE — ED Notes (Signed)
Date and time results received: 12/06/19 2221 (use smartphrase ".now" to insert current time)  Test: Ethanol Critical Value: 398  Name of Provider Notified: Nigel Bridgeman  Orders Received? Or Actions Taken?: Pt resting in bed. Pt will remain in ED until more sober.

## 2019-12-06 NOTE — ED Notes (Signed)
Pt transported to CT via treatment bed. Pts skin cleaned, bed sheets changed and pt placed in paper scrubs. Yellow socks placed.   Pt has red shorts, grey shorts, black socks, black shoes, wallet and phone all placed in patient's belongings bag on the back of treatment bed.
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
## 2019-12-07 LAB — RESPIRATORY PANEL BY RT PCR (FLU A&B, COVID)
Influenza A by PCR: NEGATIVE
Influenza B by PCR: NEGATIVE
SARS Coronavirus 2 by RT PCR: NEGATIVE

## 2019-12-07 MED ORDER — ONDANSETRON 4 MG PO TBDP: 4.0000 mg | ORAL_TABLET | Freq: Once | ORAL | Status: AC

## 2019-12-07 MED ORDER — LORAZEPAM 2 MG/ML IJ SOLN
0.0000 mg | Freq: Two times a day (BID) | INTRAMUSCULAR | Status: DC
  Administered 2019-12-09: 08:00:00 1 mg via INTRAVENOUS
  Filled 2019-12-07: qty 1

## 2019-12-07 MED ORDER — ONDANSETRON 4 MG PO TBDP
ORAL_TABLET | ORAL | Status: AC
  Administered 2019-12-07: 4 mg via ORAL
  Filled 2019-12-07: qty 1

## 2019-12-07 MED ORDER — THIAMINE HCL 100 MG PO TABS
100.0000 mg | ORAL_TABLET | Freq: Every day | ORAL | Status: DC
  Administered 2019-12-07 – 2019-12-08 (×2): 100 mg via ORAL
  Filled 2019-12-07 (×2): qty 1

## 2019-12-07 MED ORDER — LORAZEPAM 1 MG PO TABS
1.0000 mg | ORAL_TABLET | Freq: Once | ORAL | Status: AC
  Administered 2019-12-07: 1 mg via ORAL
  Filled 2019-12-07: qty 1

## 2019-12-07 MED ORDER — THIAMINE HCL 100 MG/ML IJ SOLN
100.0000 mg | Freq: Every day | INTRAMUSCULAR | Status: DC
  Administered 2019-12-09 – 2019-12-10 (×2): 100 mg via INTRAVENOUS
  Filled 2019-12-07 (×2): qty 2

## 2019-12-07 MED ORDER — LORAZEPAM 2 MG PO TABS
0.0000 mg | ORAL_TABLET | Freq: Four times a day (QID) | ORAL | Status: DC
  Administered 2019-12-07 (×2): 2 mg via ORAL
  Administered 2019-12-08: 06:00:00 1 mg via ORAL
  Administered 2019-12-08: 4 mg via ORAL
  Administered 2019-12-08: 2 mg via ORAL
  Filled 2019-12-07 (×5): qty 1
  Filled 2019-12-07: qty 2

## 2019-12-07 MED ORDER — LORAZEPAM 2 MG/ML IJ SOLN
0.0000 mg | Freq: Four times a day (QID) | INTRAMUSCULAR | Status: DC
  Administered 2019-12-08: 2 mg via INTRAVENOUS
  Filled 2019-12-07: qty 1

## 2019-12-07 MED ORDER — ONDANSETRON 4 MG PO TBDP
4.0000 mg | ORAL_TABLET | Freq: Once | ORAL | Status: AC
  Administered 2019-12-07: 4 mg via ORAL
  Filled 2019-12-07: qty 1

## 2019-12-07 MED ORDER — LORAZEPAM 2 MG PO TABS
0.0000 mg | ORAL_TABLET | Freq: Two times a day (BID) | ORAL | Status: DC
  Administered 2019-12-07: 2 mg via ORAL
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
## 2019-12-07 NOTE — ED Notes (Signed)
Pt requesting to stay for detox. MD made aware.

## 2019-12-07 NOTE — ED Notes (Signed)
Pts sheets changed and clothing changed after pt urinated on self. RN asked why patient did not tell staff he needed to urinate and pt reports he called out but no one came. Pt was in 19H at the time and multiple staff members where around him at all times and deny having heard anything.

## 2019-12-07 NOTE — ED Notes (Signed)
RN asked patient if he still intended to stay for detox. Pt at first stated he did but then stated, "will you let me think about it" "do you think I should?" RN talked to patient about importance of detox and that to stop drinking would improve how he is feeling. Pt stated again, "Let me think about it and I will let you know."   Pt agrees to take 20 minutes and then RN will ask again.

## 2019-12-07 NOTE — ED Notes (Signed)
VOL  PENDING  PLACEMENT

## 2019-12-07 NOTE — ED Notes (Signed)
Pt with urge to have BM. Placed on bedpan.

## 2019-12-07 NOTE — ED Notes (Signed)
Pt given dinner tray.

## 2019-12-07 NOTE — ED Notes (Signed)
Pt moved into room 22 due to multiple requests for quite and calling to staff to "shut up" Pt updated that if a behavioral patient arrives he will be moved back to the hall and pt verbalized understanding. Pt resting ion treatment bed with cell phone in hand.

## 2019-12-07 NOTE — ED Notes (Signed)
Pt placed on 2L via [place] while sleeping due to oxygen saturation dropping to 89%.

## 2019-12-08 ENCOUNTER — Emergency Department: Payer: Medicare HMO

## 2019-12-08 DIAGNOSIS — F10929 Alcohol use, unspecified with intoxication, unspecified: Secondary | ICD-10-CM | POA: Diagnosis not present

## 2019-12-08 DIAGNOSIS — F10231 Alcohol dependence with withdrawal delirium: Secondary | ICD-10-CM | POA: Diagnosis present

## 2019-12-08 DIAGNOSIS — I10 Essential (primary) hypertension: Secondary | ICD-10-CM

## 2019-12-08 DIAGNOSIS — E876 Hypokalemia: Secondary | ICD-10-CM

## 2019-12-08 DIAGNOSIS — F10931 Alcohol use, unspecified with withdrawal delirium: Secondary | ICD-10-CM | POA: Diagnosis present

## 2019-12-08 LAB — CBC WITH DIFFERENTIAL/PLATELET
Abs Immature Granulocytes: 0.01 10*3/uL (ref 0.00–0.07)
Basophils Absolute: 0 10*3/uL (ref 0.0–0.1)
Basophils Relative: 0 %
Eosinophils Absolute: 0.1 10*3/uL (ref 0.0–0.5)
Eosinophils Relative: 1 %
HCT: 41.3 % (ref 39.0–52.0)
Hemoglobin: 14.2 g/dL (ref 13.0–17.0)
Immature Granulocytes: 0 %
Lymphocytes Relative: 31 %
Lymphs Abs: 2.2 10*3/uL (ref 0.7–4.0)
MCH: 31.3 pg (ref 26.0–34.0)
MCHC: 34.4 g/dL (ref 30.0–36.0)
MCV: 91 fL (ref 80.0–100.0)
Monocytes Absolute: 0.7 10*3/uL (ref 0.1–1.0)
Monocytes Relative: 10 %
Neutro Abs: 4.1 10*3/uL (ref 1.7–7.7)
Neutrophils Relative %: 58 %
Platelets: 95 10*3/uL — ABNORMAL LOW (ref 150–400)
RBC: 4.54 MIL/uL (ref 4.22–5.81)
RDW: 12.8 % (ref 11.5–15.5)
WBC: 7.2 10*3/uL (ref 4.0–10.5)
nRBC: 0 % (ref 0.0–0.2)

## 2019-12-08 LAB — COMPREHENSIVE METABOLIC PANEL
ALT: 31 U/L (ref 0–44)
AST: 57 U/L — ABNORMAL HIGH (ref 15–41)
Albumin: 4.1 g/dL (ref 3.5–5.0)
Alkaline Phosphatase: 110 U/L (ref 38–126)
Anion gap: 9 (ref 5–15)
BUN: <5 mg/dL — ABNORMAL LOW (ref 8–23)
CO2: 28 mmol/L (ref 22–32)
Calcium: 8.8 mg/dL — ABNORMAL LOW (ref 8.9–10.3)
Chloride: 95 mmol/L — ABNORMAL LOW (ref 98–111)
Creatinine, Ser: 0.47 mg/dL — ABNORMAL LOW (ref 0.61–1.24)
GFR calc Af Amer: >60 mL/min (ref >60)
GFR calc non Af Amer: >60 mL/min (ref >60)
Glucose, Bld: 115 mg/dL — ABNORMAL HIGH (ref 70–99)
Potassium: 3.3 mmol/L — ABNORMAL LOW (ref 3.5–5.1)
Sodium: 132 mmol/L — ABNORMAL LOW (ref 135–145)
Total Bilirubin: 1.6 mg/dL — ABNORMAL HIGH (ref 0.3–1.2)
Total Protein: 7.4 g/dL (ref 6.5–8.1)

## 2019-12-08 LAB — CK: Total CK: 827 U/L — ABNORMAL HIGH (ref 49–397)

## 2019-12-08 LAB — PHOSPHORUS: Phosphorus: 2.8 mg/dL (ref 2.5–4.6)

## 2019-12-08 LAB — MAGNESIUM: Magnesium: 1.8 mg/dL (ref 1.7–2.4)

## 2019-12-08 MED ORDER — LORAZEPAM 2 MG/ML IJ SOLN
2.0000 mg | Freq: Once | INTRAMUSCULAR | Status: AC
  Administered 2019-12-08: 2 mg via INTRAVENOUS
  Filled 2019-12-08: qty 1

## 2019-12-08 MED ORDER — CHLORDIAZEPOXIDE HCL 25 MG PO CAPS
50.0000 mg | ORAL_CAPSULE | Freq: Three times a day (TID) | ORAL | Status: DC | PRN
  Administered 2019-12-08 – 2019-12-09 (×2): 50 mg via ORAL
  Filled 2019-12-08 (×2): qty 2

## 2019-12-08 MED ORDER — THIAMINE HCL 100 MG/ML IJ SOLN: 100.0000 mg | Freq: Every day | INTRAMUSCULAR | Status: DC

## 2019-12-08 MED ORDER — ADULT MULTIVITAMIN W/MINERALS CH
1.0000 | ORAL_TABLET | Freq: Every day | ORAL | Status: DC
  Administered 2019-12-09 – 2019-12-13 (×5): 1 via ORAL
  Filled 2019-12-08 (×5): qty 1

## 2019-12-08 MED ORDER — THIAMINE HCL 100 MG PO TABS: 100.0000 mg | ORAL_TABLET | Freq: Every day | ORAL | Status: DC

## 2019-12-08 MED ORDER — LORAZEPAM 1 MG PO TABS
1.0000 mg | ORAL_TABLET | ORAL | Status: AC | PRN
  Administered 2019-12-10: 1 mg via ORAL
  Filled 2019-12-08: qty 1

## 2019-12-08 MED ORDER — SODIUM CHLORIDE 0.9 % IV SOLN: Freq: Once | INTRAVENOUS | Status: AC

## 2019-12-08 MED ORDER — FOLIC ACID 1 MG PO TABS
1.0000 mg | ORAL_TABLET | Freq: Every day | ORAL | Status: DC
  Administered 2019-12-09 – 2019-12-13 (×5): 1 mg via ORAL
  Filled 2019-12-08 (×5): qty 1

## 2019-12-08 MED ORDER — LORAZEPAM 2 MG/ML IJ SOLN
1.0000 mg | INTRAMUSCULAR | Status: AC | PRN
  Administered 2019-12-09 – 2019-12-10 (×3): 2 mg via INTRAVENOUS
  Filled 2019-12-08 (×4): qty 1

## 2019-12-08 MED ORDER — POTASSIUM CHLORIDE 10 MEQ/100ML IV SOLN
10.0000 meq | INTRAVENOUS | Status: AC
  Administered 2019-12-08 – 2019-12-09 (×2): 10 meq via INTRAVENOUS
  Filled 2019-12-08 (×2): qty 100

## 2019-12-08 MED ORDER — SODIUM CHLORIDE 0.9 % IV SOLN: INTRAVENOUS | Status: DC
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
## 2019-12-08 NOTE — ED Notes (Signed)
Gave pt breakfast tray with juice.

## 2019-12-08 NOTE — ED Notes (Signed)
Pt moved to room 24. Monitor and bed alarm applied. Sonjia rn aware of transfer.

## 2019-12-08 NOTE — ED Provider Notes (Signed)
-----------------------------------------   5:15 AM on 12/08/2019 -----------------------------------------   Blood pressure (!) 156/96, pulse (!) 110, temperature 98 F (36.7 C), temperature source Oral, resp. rate 18, weight 56 kg, SpO2 96 %.  The patient had no acute events since last update.  Calm and cooperative at this time.  Disposition is pending per Psychiatry/Behavioral Medicine team recommendations.     Alfred Levins, [place], MD 12/08/19 [phone]

## 2019-12-08 NOTE — ED Notes (Signed)
Seizure bed pad placed on outside/ hall side rail of bed to prevent pt from continuing to put legs through railings.  Pt is unaware of his surroundings and is too weak to stand.    Lw edt

## 2019-12-08 NOTE — ED Notes (Addendum)
[name] tremulous, agitated. Stretcher is in tredelenburg position, knees are elevated. Side rails are padded and bed alarm is in place.

## 2019-12-08 NOTE — ED Notes (Signed)
Pt was given lunch tray with juice. Pt refused to eat and finally fell asleep.

## 2019-12-08 NOTE — ED Notes (Signed)
Pharmacy called for potassium.

## 2019-12-08 NOTE — ED Notes (Signed)
VOL/Pending Placement

## 2019-12-08 NOTE — ED Notes (Signed)
Patient placed in hospital bed by [facility] ED tech and Raquel RN. Bed alarm is activated.

## 2019-12-08 NOTE — ED Notes (Signed)
Pt asking to get up and walk in room. Pt assisted putting on shoes and assisted to stand. Pt unable to stand without 2 person assist and fell back on bed when he demanded to stand on his own. Dr. Joan Mayans updated.

## 2019-12-08 NOTE — ED Notes (Signed)
Changed pt's bedding ,gown,and placed new Lucien on pt. Adjusted him in bed and turned TV on.

## 2019-12-08 NOTE — BH Assessment (Signed)
Referral information for Psychiatric Hospitalization faxed to;    [facility] [phone])   [place] [phone] or [phone])   [facility] [place] [phone])   [place] [phone])

## 2019-12-08 NOTE — ED Provider Notes (Signed)
Patient's CIWA scores are escalating.  Becoming more agitated and tremulous.  Requiring increasing dose of Ativan.  At this point I do believe he will require hospitalization for further medical management.   [name], MD 12/08/19 [name]

## 2019-12-08 NOTE — ED Notes (Signed)
Rate changed on potassium to 36m/hr for patient's comfort.

## 2019-12-08 NOTE — ED Notes (Signed)
CCU charge nurse states the unit cannot take the patient at this time due to two emergent admissions.

## 2019-12-09 DIAGNOSIS — E876 Hypokalemia: Secondary | ICD-10-CM | POA: Diagnosis present

## 2019-12-09 DIAGNOSIS — F10230 Alcohol dependence with withdrawal, uncomplicated: Secondary | ICD-10-CM | POA: Diagnosis not present

## 2019-12-09 DIAGNOSIS — I959 Hypotension, unspecified: Secondary | ICD-10-CM | POA: Diagnosis not present

## 2019-12-09 DIAGNOSIS — Z20822 Contact with and (suspected) exposure to covid-19: Secondary | ICD-10-CM | POA: Diagnosis present

## 2019-12-09 DIAGNOSIS — R001 Bradycardia, unspecified: Secondary | ICD-10-CM | POA: Diagnosis not present

## 2019-12-09 DIAGNOSIS — K7030 Alcoholic cirrhosis of liver without ascites: Secondary | ICD-10-CM | POA: Diagnosis not present

## 2019-12-09 DIAGNOSIS — Z801 Family history of malignant neoplasm of trachea, bronchus and lung: Secondary | ICD-10-CM | POA: Diagnosis not present

## 2019-12-09 DIAGNOSIS — R251 Tremor, unspecified: Secondary | ICD-10-CM | POA: Diagnosis not present

## 2019-12-09 DIAGNOSIS — Z813 Family history of other psychoactive substance abuse and dependence: Secondary | ICD-10-CM | POA: Diagnosis not present

## 2019-12-09 DIAGNOSIS — M6282 Rhabdomyolysis: Secondary | ICD-10-CM

## 2019-12-09 DIAGNOSIS — F10231 Alcohol dependence with withdrawal delirium: Secondary | ICD-10-CM | POA: Diagnosis present

## 2019-12-09 DIAGNOSIS — I10 Essential (primary) hypertension: Secondary | ICD-10-CM | POA: Diagnosis present

## 2019-12-09 DIAGNOSIS — F10229 Alcohol dependence with intoxication, unspecified: Secondary | ICD-10-CM | POA: Diagnosis present

## 2019-12-09 DIAGNOSIS — Z87891 Personal history of nicotine dependence: Secondary | ICD-10-CM | POA: Diagnosis not present

## 2019-12-09 DIAGNOSIS — F10929 Alcohol use, unspecified with intoxication, unspecified: Secondary | ICD-10-CM | POA: Diagnosis not present

## 2019-12-09 DIAGNOSIS — I69351 Hemiplegia and hemiparesis following cerebral infarction affecting right dominant side: Secondary | ICD-10-CM | POA: Diagnosis not present

## 2019-12-09 DIAGNOSIS — K7460 Unspecified cirrhosis of liver: Secondary | ICD-10-CM | POA: Diagnosis present

## 2019-12-09 DIAGNOSIS — Z8249 Family history of ischemic heart disease and other diseases of the circulatory system: Secondary | ICD-10-CM | POA: Diagnosis not present

## 2019-12-09 DIAGNOSIS — D61818 Other pancytopenia: Secondary | ICD-10-CM | POA: Diagnosis present

## 2019-12-09 DIAGNOSIS — D696 Thrombocytopenia, unspecified: Secondary | ICD-10-CM

## 2019-12-09 LAB — CBC
HCT: 39.2 % (ref 39.0–52.0)
Hemoglobin: 13.6 g/dL (ref 13.0–17.0)
MCH: 31.8 pg (ref 26.0–34.0)
MCHC: 34.7 g/dL (ref 30.0–36.0)
MCV: 91.6 fL (ref 80.0–100.0)
Platelets: 84 10*3/uL — ABNORMAL LOW (ref 150–400)
RBC: 4.28 MIL/uL (ref 4.22–5.81)
RDW: 13.1 % (ref 11.5–15.5)
WBC: 6.4 10*3/uL (ref 4.0–10.5)
nRBC: 0 % (ref 0.0–0.2)

## 2019-12-09 LAB — MAGNESIUM: Magnesium: 2.3 mg/dL (ref 1.7–2.4)

## 2019-12-09 LAB — HIV ANTIBODY (ROUTINE TESTING W REFLEX): HIV Screen 4th Generation wRfx: NONREACTIVE

## 2019-12-09 LAB — PHOSPHORUS: Phosphorus: 3.1 mg/dL (ref 2.5–4.6)

## 2019-12-09 LAB — TSH: TSH: 1.675 u[IU]/mL (ref 0.350–4.500)

## 2019-12-09 MED ORDER — ATENOLOL 25 MG PO TABS
25.0000 mg | ORAL_TABLET | Freq: Two times a day (BID) | ORAL | Status: DC
  Administered 2019-12-09 – 2019-12-11 (×5): 25 mg via ORAL
  Filled 2019-12-09 (×7): qty 1

## 2019-12-09 MED ORDER — ONDANSETRON HCL 4 MG PO TABS: 4.0000 mg | ORAL_TABLET | Freq: Four times a day (QID) | ORAL | Status: DC | PRN

## 2019-12-09 MED ORDER — ENSURE ENLIVE PO LIQD
237.0000 mL | Freq: Two times a day (BID) | ORAL | Status: DC
  Administered 2019-12-10 – 2019-12-13 (×6): 237 mL via ORAL

## 2019-12-09 MED ORDER — MAGNESIUM SULFATE 2 GM/50ML IV SOLN
2.0000 g | Freq: Once | INTRAVENOUS | Status: AC
  Administered 2019-12-09: 2 g via INTRAVENOUS
  Filled 2019-12-09: qty 50

## 2019-12-09 MED ORDER — ACETAMINOPHEN 325 MG PO TABS
650.0000 mg | ORAL_TABLET | Freq: Four times a day (QID) | ORAL | Status: DC | PRN
  Administered 2019-12-12: 06:00:00 650 mg via ORAL
  Filled 2019-12-09: qty 2

## 2019-12-09 MED ORDER — PAROXETINE HCL 30 MG PO TABS
30.0000 mg | ORAL_TABLET | Freq: Every day | ORAL | Status: DC
  Administered 2019-12-09 – 2019-12-13 (×5): 30 mg via ORAL
  Filled 2019-12-09 (×7): qty 1

## 2019-12-09 MED ORDER — SODIUM CHLORIDE 0.9 % IV SOLN: INTRAVENOUS | Status: DC

## 2019-12-09 MED ORDER — ONDANSETRON HCL 4 MG/2ML IJ SOLN: 4.0000 mg | Freq: Four times a day (QID) | INTRAMUSCULAR | Status: DC | PRN

## 2019-12-09 MED ORDER — POTASSIUM CHLORIDE 20 MEQ PO PACK
20.0000 meq | PACK | Freq: Once | ORAL | Status: AC
  Administered 2019-12-09: 20 meq via ORAL
  Filled 2019-12-09: qty 1

## 2019-12-09 MED ORDER — ACETAMINOPHEN 650 MG RE SUPP: 650.0000 mg | Freq: Four times a day (QID) | RECTAL | Status: DC | PRN

## 2019-12-09 NOTE — ED Notes (Signed)
Pt st "do they sell beers here". Pt st feeling "anxious" at this time.

## 2019-12-09 NOTE — ED Notes (Signed)
Psychiatrist at bedside at this time.

## 2019-12-09 NOTE — Consult Note (Signed)
  Psychiatry called for capacity consult on patient admitted with alcohol withdrawal.  Due to patient's current level of altered mental status he is unable to participate fully in a capacity evaluation.   At this current time patient does not have capacity due to his AMS  Psychiatry to reevaluate once patient clears up from delirium

## 2019-12-09 NOTE — Progress Notes (Signed)
Patient ID: Douglas Peterson., male   DOB: 28-Oct-1951, 69 y.o.   MRN: [id number] [facility] PROGRESS NOTE  Douglas Peterson. ONG:[id number] DOB: 11/28/1950 DOA: 12/06/2019 PCP: [name], MD  HPI/Subjective: Patient feels okay.  Was trying to eat this morning when I saw him.  Having tremor of his upper extremities.  Cannot tell me when his last drink was but does drink alcohol.  Objective: Vitals:   12/09/19 1121 12/09/19 1231  BP: (!) 156/93   Pulse: (!) 104 96  Resp: (!) 35 (!) 23  Temp:    SpO2: 95% 96%   No intake or output data in the 24 hours ending 12/09/19 1238 Filed Weights   12/06/19 2111  Weight: 56 kg    ROS: Review of Systems  Constitutional: Negative for chills and fever.  Eyes: Negative for blurred vision.  Respiratory: Positive for shortness of breath. Negative for cough.   Cardiovascular: Negative for chest pain.  Gastrointestinal: Negative for abdominal pain, constipation, diarrhea, nausea and vomiting.  Genitourinary: Negative for dysuria.  Musculoskeletal: Negative for joint pain.  Neurological: Positive for tremors. Negative for dizziness and headaches.   Exam: Physical Exam  HENT:  Nose: No mucosal edema.  Mouth/Throat: No oropharyngeal exudate or posterior oropharyngeal edema.  Eyes: Conjunctivae and lids are normal.  Neck: Carotid bruit is not present.  Cardiovascular: S1 normal and S2 normal. Exam reveals no gallop.  No murmur heard. Respiratory: No respiratory distress. He has decreased breath sounds in the right lower field and the left lower field. He has no wheezes. He has no rhonchi. He has no rales.  GI: Soft. Bowel sounds are normal. There is no abdominal tenderness.  Musculoskeletal:     Right ankle: No swelling.     Left ankle: No swelling.  Lymphadenopathy:    He has no cervical adenopathy.  Neurological: He is alert. No cranial nerve deficit.  Upper extremity tremor left greater than right  Skin: Skin is warm. No rash  noted. Nails show no clubbing.  Psychiatric: He has a normal mood and affect.      Data Reviewed: Basic Metabolic Panel: Recent Labs  Lab 12/06/19 2138 12/08/19 1920  NA 137 132*  K 3.7 3.3*  CL 91* 95*  CO2 30 28  GLUCOSE 108* 115*  BUN <5* <5*  CREATININE 0.43* 0.47*  CALCIUM 8.8* 8.8*  MG  --  1.8  PHOS  --  2.8   Liver Function Tests: Recent Labs  Lab 12/06/19 2138 12/08/19 1920  AST 62* 57*  ALT 38 31  ALKPHOS 122 110  BILITOT 0.6 1.6*  PROT 8.6* 7.4  ALBUMIN 4.8 4.1   CBC: Recent Labs  Lab 12/06/19 2138 12/08/19 1920  WBC 11.0* 7.2  NEUTROABS 4.0 4.1  HGB 16.9 14.2  HCT 50.0 41.3  MCV 91.7 91.0  PLT 137* 95*   Cardiac Enzymes: Recent Labs  Lab 12/08/19 1920  CKTOTAL 827*     Recent Results (from the past 240 hour(s))  Respiratory Panel by RT PCR (Flu A&B, Covid) - Nasopharyngeal Swab     Status: None   Collection Time: 12/07/19  6:40 AM   Specimen: Nasopharyngeal Swab  Result Value Ref Range Status   SARS Coronavirus 2 by RT PCR NEGATIVE NEGATIVE Final    Comment: (NOTE) SARS-CoV-2 target nucleic acids are NOT DETECTED. The SARS-CoV-2 RNA is generally detectable in upper respiratoy specimens during the acute phase of infection. The lowest concentration of SARS-CoV-2 viral copies this assay can detect  is 131 copies/mL. A negative result does not preclude SARS-Cov-2 infection and should not be used as the sole basis for treatment or other patient management decisions. A negative result may occur with  improper specimen collection/handling, submission of specimen other than nasopharyngeal swab, presence of viral mutation(s) within the areas targeted by this assay, and inadequate number of viral copies (<131 copies/mL). A negative result must be combined with clinical observations, patient history, and epidemiological information. The expected result is Negative. Fact Sheet for Patients:  PinkCheek.be Fact  Sheet for Healthcare Providers:  GravelBags.it This test is not yet ap proved or cleared by the [place] FDA and  has been authorized for detection and/or diagnosis of SARS-CoV-2 by FDA under an Emergency Use Authorization (EUA). This EUA will remain  in effect (meaning this test can be used) for the duration of the COVID-19 declaration under Section 564(b)(1) of the Act, 21 U.S.C. section 360bbb-3(b)(1), unless the authorization is terminated or revoked sooner.    Influenza A by PCR NEGATIVE NEGATIVE Final   Influenza B by PCR NEGATIVE NEGATIVE Final    Comment: (NOTE) The Xpert Xpress SARS-CoV-2/FLU/RSV assay is intended as an aid in  the diagnosis of influenza from Nasopharyngeal swab specimens and  should not be used as a sole basis for treatment. Nasal washings and  aspirates are unacceptable for Xpert Xpress SARS-CoV-2/FLU/RSV  testing. Fact Sheet for Patients: PinkCheek.be Fact Sheet for Healthcare Providers: GravelBags.it This test is not yet approved or cleared by the [place] FDA and  has been authorized for detection and/or diagnosis of SARS-CoV-2 by  FDA under an Emergency Use Authorization (EUA). This EUA will remain  in effect (meaning this test can be used) for the duration of the  Covid-19 declaration under Section 564(b)(1) of the Act, 21  U.S.C. section 360bbb-3(b)(1), unless the authorization is  terminated or revoked. Performed at [facility], [address] [place], [place] [address]      Studies: DG Chest Portable 1 View  Result Date: 12/08/2019 CLINICAL DATA:  Weakness EXAM: PORTABLE CHEST 1 VIEW COMPARISON:  09/22/2019 FINDINGS: Cardiac shadow Is within normal limits. The lungs are hypoinflated bilaterally. No focal infiltrate or sizable effusion is seen. No bony abnormality is noted. IMPRESSION: Poor inspiratory effort.  No acute abnormality seen.  Electronically Signed   By: Inez Catalina M.D.   On: 12/08/2019 19:04    Scheduled Meds: . atenolol  25 mg Oral BID  . folic acid  1 mg Oral Daily  . LORazepam  0-4 mg Intravenous Q12H   Or  . LORazepam  0-4 mg Oral Q12H  . multivitamin with minerals  1 tablet Oral Daily  . PARoxetine  30 mg Oral Daily  . thiamine  100 mg Intravenous Daily   Continuous Infusions: . sodium chloride 100 mL/hr at 12/09/19 0730  . sodium chloride      Assessment/Plan:  1. Alcohol withdrawal.  Patient on Ativan withdrawal and standing dose Librium.  Daughter concerned about his competence and living situation and inability to take care of himself.  We will get a psychiatric consultation. 2. Essential hypertension and tremor on atenolol 3. Hypomagnesemia replace IV 4. Hypokalemia replace orally 5. Mild rhabdomyolysis on IV fluids 6. Cirrhosis of liver with pancytopenia 7. Weakness we will get physical therapy evaluation 8. Repeated hospitalizations we will get palliative care consultation also  Code Status:     Code Status Orders  (From admission, onward)         Start  Ordered   12/09/19 1237  Full code  Continuous     12/09/19 1237        Code Status History    Date Active Date Inactive Code Status Order ID Comments User Context   12/07/2019 0618 12/09/2019 1237 Full Code [id number]  Gregor Hams, MD ED   09/20/2019 2314 10/03/2019 1741 Full Code [id number]  [name], MD ED   08/14/2019 1932 08/25/2019 2145 Full Code [id number]  Loletha Grayer, MD ED   08/14/2019 1840 08/14/2019 1932 Full Code [id number]  Blake Divine, MD ED   06/12/2019 2308 06/21/2019 2018 Full Code [id number]  Dustin Flock, MD Inpatient   03/29/2019 1337 04/01/2019 1939 DNR [id number]  Philis Pique, NP Inpatient   03/20/2019 1510 03/29/2019 1337 Full Code [id number]  Mayo, Pete Pelt, MD Inpatient   03/20/2019 0507 03/20/2019 1509 Full Code [id number]  Gregor Hams, MD ED   11/16/2018 0101 11/23/2018  1455 Full Code [id number]  Gladstone Lighter, MD ED   10/12/2018 1355 10/13/2018 1839 Full Code [id number]  Arta Silence, MD Inpatient   10/12/2018 0039 10/12/2018 1354 Full Code [id number]  Orbie Pyo, MD ED   05/16/2018 1009 05/16/2018 1611 Full Code [id number]  Schuyler Amor, MD ED   03/12/2018 1605 03/16/2018 2212 DNR [id number]  Jimmy Footman, NP Inpatient   03/08/2018 0950 03/12/2018 1605 Full Code [id number]  Harrie Foreman, MD Inpatient   03/07/2018 1607 03/08/2018 0950 Full Code [id number]  [name], MD ED   11/23/2017 1502 11/26/2017 1642 Full Code [id number]  Vaughan Basta, MD Inpatient   09/22/2017 1202 09/22/2017 2017 Full Code [id number]  Nena Polio, MD ED   09/16/2017 2344 09/18/2017 1550 Full Code [id number]  Lance Coon, MD Inpatient   09/16/2017 2037 09/16/2017 2343 Full Code [id number]  Orbie Pyo, MD ED   12/22/2016 1126 12/25/2016 1910 DNR [id number]  [name], MD Inpatient   12/17/2016 1147 12/22/2016 1126 Full Code [id number]  Henreitta Leber, MD Inpatient   09/12/2015 2108 09/15/2015 1501 Full Code [id number]  Henreitta Leber, MD Inpatient   07/09/2015 1559 07/11/2015 2110 Full Code [id number]  Bettey Costa, MD Inpatient   Advance Care Planning Activity     Family Communication: Spoke with daughter on the phone Disposition Plan: Patient will need to go through alcohol withdrawal while here in the hospital.  Currently not a safe discharge home.  Consultants:  Palliative care  Psychiatry  Time spent: 28 minutes  [place]

## 2019-12-09 NOTE — Progress Notes (Signed)
OT Cancellation Note  Patient Details Name: Douglas Peterson. MRN: [id number] DOB: [birth year]   Cancelled Treatment:    Reason Eval/Treat Not Completed: Patient declined, no reason specified  OT consult received and chart reviewed. Upon checking on pt, he is deeply asleep and hard to wake. When he does awaken slightly, he politely requests that OT check back tomorrow, stating "I need another day to get acclimated". OT will f/u for evaluation next scheduled date. Thank you.   Gerrianne Scale, [place], OTR/L ascom [phone] 12/09/19, 4:36 PM

## 2019-12-09 NOTE — ED Notes (Signed)
Pt provided with lunch tray by this RN. Pt unable to feed self. This RN fed pt. Pt able to eat 50% of his food tray.

## 2019-12-09 NOTE — ED Notes (Signed)
MD Wieting at bedside at this time.

## 2019-12-09 NOTE — Progress Notes (Signed)
Initial Nutrition Assessment  RD working remotely.  DOCUMENTATION CODES:   Not applicable  INTERVENTION:  Recommend liberalizing diet to regular.  Provide Ensure Enlive po BID, each supplement provides 350 kcal and 20 grams of protein.   Continue MVI daily, thiamine 481 mg daily, folic acid 1 mg daily.  NUTRITION DIAGNOSIS:   Increased nutrient needs related to catabolic illness(cirrhosis) as evidenced by estimated needs.  GOAL:   Patient will meet greater than or equal to 90% of their needs  MONITOR:   PO intake, Supplement acceptance, Labs, Weight trends, I & O's  REASON FOR ASSESSMENT:   Consult Malnutrition Eval  ASSESSMENT:   69 year old male with PMHx of HTN, hyponatremia, EtOH abuse, cirrhosis of the liver admitted with EtOH withdrawal.   Patient down in ED so unable to obtain any nutrition/weight history at this time. He is ordered for heart healthy diet but there is no meal completion documentation available at this time. Will monitor for adequacy of intake. [name] at risk for malnutrition. He will benefit from liberalized diet and oral nutrition supplements.  Difficult to trend weight as the weights in chart fluctuate. He is currently 56 kg (123.46 lbs).   Medications reviewed and include: folic acid 1 mg daily, Ativan, MVI daily, thiamine 100 mg daily, NS at 50 mL/hr.  Labs reviewed: Sodium 132, Potassium 3.3, Chloride 95, CO2 28, BUN <5, Creatinine 0.47, Phosphorus 2.8, Magnesium 1.8.  NUTRITION - FOCUSED PHYSICAL EXAM:  Unable to complete at this time.  Diet Order:   Diet Order            Diet Heart Room service appropriate? Yes; Fluid consistency: Thin  Diet effective now             EDUCATION NEEDS:   No education needs have been identified at this time  Skin:  Skin Assessment: Reviewed RN Assessment(skin assessment not yet completed)  Last BM:  Unknown  Height:   Ht Readings from Last 1 Encounters:  11/26/19 5' 4"  (1.626 m)    Weight:   Wt Readings from Last 1 Encounters:  12/06/19 56 kg   Ideal Body Weight:  59.1 kg  BMI:  Body mass index is 21.19 kg/m.  Estimated Nutritional Needs:   Kcal:  1600-1800  Protein:  80-90 grams  Fluid:  1.6-1.8 L/day  Jacklynn Barnacle, MS, RD, LDN Pager number available on Amion

## 2019-12-09 NOTE — Care Management (Signed)
TOC RN CM: TOC aware of consult. Reviewed with inpatient SW and will follow up when patient medically stable.

## 2019-12-09 NOTE — Progress Notes (Signed)
Patient admitted to room, placed on telemetry, bed alarm on high sensitivity. Unable to complete admission profile at this time d/t AMS.

## 2019-12-10 DIAGNOSIS — D61818 Other pancytopenia: Secondary | ICD-10-CM

## 2019-12-10 LAB — COMPREHENSIVE METABOLIC PANEL
ALT: 26 U/L (ref 0–44)
AST: 39 U/L (ref 15–41)
Albumin: 3.6 g/dL (ref 3.5–5.0)
Alkaline Phosphatase: 97 U/L (ref 38–126)
Anion gap: 9 (ref 5–15)
BUN: 9 mg/dL (ref 8–23)
CO2: 22 mmol/L (ref 22–32)
Calcium: 8.7 mg/dL — ABNORMAL LOW (ref 8.9–10.3)
Chloride: 107 mmol/L (ref 98–111)
Creatinine, Ser: 0.53 mg/dL — ABNORMAL LOW (ref 0.61–1.24)
GFR calc Af Amer: >60 mL/min (ref >60)
GFR calc non Af Amer: >60 mL/min (ref >60)
Glucose, Bld: 78 mg/dL (ref 70–99)
Potassium: 3.9 mmol/L (ref 3.5–5.1)
Sodium: 138 mmol/L (ref 135–145)
Total Bilirubin: 1.4 mg/dL — ABNORMAL HIGH (ref 0.3–1.2)
Total Protein: 6.9 g/dL (ref 6.5–8.1)

## 2019-12-10 LAB — CK: Total CK: 257 U/L (ref 49–397)

## 2019-12-10 MED ORDER — THIAMINE HCL 100 MG PO TABS
100.0000 mg | ORAL_TABLET | Freq: Every day | ORAL | Status: DC
  Administered 2019-12-11 – 2019-12-13 (×3): 100 mg via ORAL
  Filled 2019-12-10 (×3): qty 1

## 2019-12-10 NOTE — Evaluation (Signed)
Occupational Therapy Evaluation Patient Details Name: Douglas Peterson. MRN: [id number] DOB: Dec 20, 1950 Today's Date: 12/10/2019    History of Present Illness Douglas Peterson. is a 69 y.o. male with medical history significant of EtOh abuse, HTN,Hyponatremia, hx of CVA with right side weakness   Clinical Impression   Pt asleep upon arrival of OT - but was able to awake - and after assessment of bilateral UE ROM and strength, as well as history questions - pt was eager to participate in OT eval  - Pt has history of CVA with spasticity of R UE - do report he has splint but did not use it for while - was painful with PROM for digits extention , impaired PROM for R shoulder, elbow , wrist and digits - Pt show decrease bed mobility and transfers in ADL's  - sitting EOB leaning initially to L -but able to correct with mirroring from OT - Pt max A for UB and LB dressing and bathing - was able to maintain sitting balance without UE support - standing balance and functional mobility impaired in ADL's - pt can benefit from OT services   Follow Up Recommendations  Home health OT;SNF    Equipment Recommendations    walker , cane, shower chair    Recommendations for Other Services PT consult;Speech consult     Precautions / Restrictions Precautions Precautions: Fall Restrictions Weight Bearing Restrictions: No      Mobility  Balance   ADL either performed or assessed with clinical judgement   ADL                                               Vision         Perception     Praxis      Pertinent Vitals/Pain Pain Assessment: No/denies pain     Hand Dominance Left(since old CVA)   Extremity/Trunk Assessment Upper Extremity Assessment Upper Extremity Assessment: Generalized weakness   Lower Extremity Assessment Lower Extremity Assessment: Generalized weakness(-3/5 BLE)       Communication Communication Communication: (soft)   Cognition Arousal/Alertness:  Awake/alert Behavior During Therapy: WFL for tasks assessed/performed Overall Cognitive Status: Within Functional Limits for tasks assessed                                     General Comments       Exercises Exercises: General Lower Extremity General Exercises - Lower Extremity Other Exercises Other Exercises: Pt had CVA in past affecting R UE - spasticity - keeping shoulder ADD, flexed elbow , hand in fist - PROM shoulde to 90 , elbow to about -60 , wrist neutral , hand able to partially open digits but pain in palm and fingers with pROM Other Exercises: supine <> sit max A Other Exercises: Sitting EOB -pt leaning to R and with heat- upon mirroring pt position able to correct to midline and maintain without hand support on bed Other Exercises: Sit to stand Max A , and standing pt did HHA , leaning with legs against bed - and mod A to stand   Shoulder Instructions      Home Living Family/patient expects to be discharged to:: Private residence Living Arrangements: Alone Available Help at Discharge: Other (Comment) Type of Home: House Home Access: Stairs to  enter Entrance Stairs-Number of Steps: 4 Entrance Stairs-Rails: Right;Left;Can reach both Home Layout: One level     Bathroom Shower/Tub: Teacher, early years/pre: Standard     Home Equipment: Environmental consultant - 2 wheels;Cane - single point;Tub bench;Grab bars - tub/shower   Additional Comments: RUE Resting hand splint - did use it in past - but not recently per pt      Prior Functioning/Environment Level of Independence: Independent with assistive device(s)        Comments: Pt report some days he uses cane and somedays he uses walker        OT Problem List: Decreased strength;Impaired tone;Decreased range of motion;Decreased activity tolerance;Impaired UE functional use;Pain;Impaired balance (sitting and/or standing)      OT Treatment/Interventions: Self-care/ADL training;Manual  therapy;Therapeutic exercise;Patient/family education;Splinting    OT Goals(Current goals can be found in the care plan section) Acute Rehab OT Goals Patient Stated Goal: Want to get better , walk , help myself OT Goal Formulation: With patient Time For Goal Achievement: 12/24/19 Potential to Achieve Goals: Good  OT Frequency: Min 2X/week   Barriers to D/C:            Co-evaluation              AM-PAC OT "6 Clicks" Daily Activity     Outcome Measure Help from another person eating meals?: A Little(setup) Help from another person taking care of personal grooming?: A Lot Help from another person toileting, which includes using toliet, bedpan, or urinal?: A Lot Help from another person bathing (including washing, rinsing, drying)?: A Lot Help from another person to put on and taking off regular upper body clothing?: A Lot Help from another person to put on and taking off regular lower body clothing?: A Lot 6 Click Score: 13   End of Session Equipment Utilized During Treatment: Gait belt  Activity Tolerance: Patient tolerated treatment well Patient left: in bed;with bed alarm set  OT Visit Diagnosis: Unsteadiness on feet (R26.81);Other abnormalities of gait and mobility (R26.89);Repeated falls (R29.6);Muscle weakness (generalized) (M62.81);History of falling (Z91.81);Hemiplegia and hemiparesis Hemiplegia - Right/Left: Right Hemiplegia - dominant/non-dominant: Dominant                Time: 5993-5701 OT Time Calculation (min): 31 min Charges:  OT General Charges $OT Visit: 1 Visit OT Evaluation $OT Eval Moderate Complexity: 1 Mod  }  ,  OTR/L,CLT 12/10/2019, 2:20 PM

## 2019-12-10 NOTE — Progress Notes (Signed)
Patient difficult to arouse.  Very drowsy related to OT earlier per his RN.  Unable to assess for capacity at this time.  Left number to call when he is more alert.  Waylan Boga, PMHNP

## 2019-12-10 NOTE — Plan of Care (Signed)
New goals

## 2019-12-10 NOTE — Progress Notes (Signed)
PHARMACIST - PHYSICIAN COMMUNICATION  DR:   Leslye Peer  CONCERNING: IV to Oral Route Change Policy  RECOMMENDATION: This patient is receiving thiamine by the intravenous route.  Based on criteria approved by the Pharmacy and Therapeutics Committee, the intravenous medication(s) is/are being converted to the equivalent oral dose form(s).   DESCRIPTION: These criteria include:  The patient is eating (either orally or via tube) and/or has been taking other orally administered medications for a least 24 hours  The patient has no evidence of active gastrointestinal bleeding or impaired GI absorption (gastrectomy, short bowel, patient on TNA or NPO).  If you have questions about this conversion, please contact the [place], PharmD, BCPS Clinical Pharmacist 12/10/2019 11:57 AM

## 2019-12-10 NOTE — Progress Notes (Signed)
End of Shift Summary:  Date: 12/10/19 Shift: 0700-1900 Ambulatory: sitting at bedside and up with PT/OT this morning Significant Events: Patient with good appetite today, worked with PT & OT. Ativan given x1 and fluids d/c as patient can now take PO fluids. No other significant events; VSS.

## 2019-12-10 NOTE — Progress Notes (Signed)
Patient ID: Douglas Pesa., male   DOB: Mar 24, 1951, 69 y.o.   MRN: [id number] [facility] PROGRESS NOTE  Douglas Pesa. [id number]:[id number] DOB: [birth year] DOA: 12/06/2019 PCP: [name], MD  HPI/Subjective: Patient seen this morning and mumbled when I sternal rub him.  Patient did not wake up.  As per nursing staff, he did eat breakfast and lunch.  Patient did receive some Ativan this morning.  Objective: Vitals:   12/10/19 0918 12/10/19 1137  BP: 139/78 136/69  Pulse: 64 (!) 58  Resp: 15 18  Temp: 98 F (36.7 C) 97.8 F (36.6 C)  SpO2: 94% 97%    Intake/Output Summary (Last 24 hours) at 12/10/2019 1319 Last data filed at 12/10/2019 8588 Gross per 24 hour  Intake 1072.61 ml  Output 100 ml  Net 972.61 ml   Filed Weights   12/06/19 2111  Weight: 56 kg    ROS: Review of Systems  Unable to perform ROS: Acuity of condition   Exam: Physical Exam  Constitutional: He appears lethargic.  HENT:  Nose: No mucosal edema.  Unable to look into mouth today  Eyes: Conjunctivae and lids are normal.  When I opened his eyes his eyes rolled back in his head.  Neck: Carotid bruit is not present.  Cardiovascular: S1 normal and S2 normal. Exam reveals no gallop.  No murmur heard. Respiratory: No respiratory distress. He has decreased breath sounds in the right lower field and the left lower field. He has no wheezes. He has no rhonchi. He has no rales.  GI: Soft. Bowel sounds are normal. There is no abdominal tenderness.  Musculoskeletal:     Right ankle: No swelling.     Left ankle: No swelling.  Lymphadenopathy:    He has no cervical adenopathy.  Neurological: He appears lethargic.  Mumbled with sternal rub  Skin: Skin is warm. No rash noted. Nails show no clubbing.  Psychiatric:  Mumbled with sternal rub.      Data Reviewed: Basic Metabolic Panel: Recent Labs  Lab 12/06/19 2138 12/08/19 1920 12/09/19 1421 12/10/19 0647  NA 137 132*  --  138  K 3.7 3.3*  --  3.9   CL 91* 95*  --  107  CO2 30 28  --  22  GLUCOSE 108* 115*  --  78  BUN <5* <5*  --  9  CREATININE 0.43* 0.47*  --  0.53*  CALCIUM 8.8* 8.8*  --  8.7*  MG  --  1.8 2.3  --   PHOS  --  2.8 3.1  --    Liver Function Tests: Recent Labs  Lab 12/06/19 2138 12/08/19 1920 12/10/19 0647  AST 62* 57* 39  ALT 38 31 26  ALKPHOS 122 110 97  BILITOT 0.6 1.6* 1.4*  PROT 8.6* 7.4 6.9  ALBUMIN 4.8 4.1 3.6   CBC: Recent Labs  Lab 12/06/19 2138 12/08/19 1920 12/09/19 1421  WBC 11.0* 7.2 6.4  NEUTROABS 4.0 4.1  --   HGB 16.9 14.2 13.6  HCT 50.0 41.3 39.2  MCV 91.7 91.0 91.6  PLT 137* 95* 84*   Cardiac Enzymes: Recent Labs  Lab 12/08/19 1920 12/10/19 0647  CKTOTAL 827* 257     Recent Results (from the past 240 hour(s))  Respiratory Panel by RT PCR (Flu A&B, Covid) - Nasopharyngeal Swab     Status: None   Collection Time: 12/07/19  6:40 AM   Specimen: Nasopharyngeal Swab  Result Value Ref Range Status   SARS Coronavirus 2 by  RT PCR NEGATIVE NEGATIVE Final    Comment: (NOTE) SARS-CoV-2 target nucleic acids are NOT DETECTED. The SARS-CoV-2 RNA is generally detectable in upper respiratoy specimens during the acute phase of infection. The lowest concentration of SARS-CoV-2 viral copies this assay can detect is 131 copies/mL. A negative result does not preclude SARS-Cov-2 infection and should not be used as the sole basis for treatment or other patient management decisions. A negative result may occur with  improper specimen collection/handling, submission of specimen other than nasopharyngeal swab, presence of viral mutation(s) within the areas targeted by this assay, and inadequate number of viral copies (<131 copies/mL). A negative result must be combined with clinical observations, patient history, and epidemiological information. The expected result is Negative. Fact Sheet for Patients:  PinkCheek.be Fact Sheet for Healthcare Providers:   GravelBags.it This test is not yet ap proved or cleared by the [place] FDA and  has been authorized for detection and/or diagnosis of SARS-CoV-2 by FDA under an Emergency Use Authorization (EUA). This EUA will remain  in effect (meaning this test can be used) for the duration of the COVID-19 declaration under Section 564(b)(1) of the Act, 21 U.S.C. section 360bbb-3(b)(1), unless the authorization is terminated or revoked sooner.    Influenza A by PCR NEGATIVE NEGATIVE Final   Influenza B by PCR NEGATIVE NEGATIVE Final    Comment: (NOTE) The Xpert Xpress SARS-CoV-2/FLU/RSV assay is intended as an aid in  the diagnosis of influenza from Nasopharyngeal swab specimens and  should not be used as a sole basis for treatment. Nasal washings and  aspirates are unacceptable for Xpert Xpress SARS-CoV-2/FLU/RSV  testing. Fact Sheet for Patients: PinkCheek.be Fact Sheet for Healthcare Providers: GravelBags.it This test is not yet approved or cleared by the [place] FDA and  has been authorized for detection and/or diagnosis of SARS-CoV-2 by  FDA under an Emergency Use Authorization (EUA). This EUA will remain  in effect (meaning this test can be used) for the duration of the  Covid-19 declaration under Section 564(b)(1) of the Act, 21  U.S.C. section 360bbb-3(b)(1), unless the authorization is  terminated or revoked. Performed at [facility], [address] [place], [place] [address]      Studies: DG Chest Portable 1 View  Result Date: 12/08/2019 CLINICAL DATA:  Weakness EXAM: PORTABLE CHEST 1 VIEW COMPARISON:  09/22/2019 FINDINGS: Cardiac shadow Is within normal limits. The lungs are hypoinflated bilaterally. No focal infiltrate or sizable effusion is seen. No bony abnormality is noted. IMPRESSION: Poor inspiratory effort.  No acute abnormality seen. Electronically Signed   By: Inez Catalina M.D.   On: 12/08/2019 19:04    Scheduled Meds: . atenolol  25 mg Oral BID  . feeding supplement (ENSURE ENLIVE)  237 mL Oral BID BM  . folic acid  1 mg Oral Daily  . LORazepam  0-4 mg Intravenous Q12H   Or  . LORazepam  0-4 mg Oral Q12H  . multivitamin with minerals  1 tablet Oral Daily  . PARoxetine  30 mg Oral Daily  . [START ON 12/11/2019] thiamine  100 mg Oral Daily   Continuous Infusions:   Assessment/Plan:  1. Alcohol withdrawal.  Patient on alcohol withdrawal protocol with Ativan and Librium.  Patient received Ativan this morning and was difficult to arouse.  As per nursing staff he did eat breakfast and lunch. 2. Essential hypertension and tremor on atenolol 3. Hypomagnesemia replaced 4. Hypokalemia replaced 5. Mild rhabdomyolysis.  Discontinue IV fluids and CPK has improved  6. Cirrhosis of liver with pancytopenia 7. Weakness we will get physical therapy evaluation 8. Palliative care consultation for Monday  Code Status:     Code Status Orders  (From admission, onward)         Start     Ordered   12/09/19 1237  Full code  Continuous     12/09/19 1237        Code Status History    Date Active Date Inactive Code Status Order ID Comments User Context   12/07/2019 0618 12/09/2019 1237 Full Code [id number]  Gregor Hams, MD ED   09/20/2019 2314 10/03/2019 1741 Full Code [id number]  [name], MD ED   08/14/2019 1932 08/25/2019 2145 Full Code [id number]  Loletha Grayer, MD ED   08/14/2019 1840 08/14/2019 1932 Full Code [id number]  Blake Divine, MD ED   06/12/2019 2308 06/21/2019 2018 Full Code [id number]  Dustin Flock, MD Inpatient   03/29/2019 1337 04/01/2019 1939 DNR [id number]  Philis Pique, NP Inpatient   03/20/2019 1510 03/29/2019 1337 Full Code [id number]  Mayo, Pete Pelt, MD Inpatient   03/20/2019 0507 03/20/2019 1509 Full Code [id number]  Gregor Hams, MD ED   11/16/2018 0101 11/23/2018 1455 Full Code [id number]  Gladstone Lighter, MD ED    10/12/2018 1355 10/13/2018 1839 Full Code [id number]  Arta Silence, MD Inpatient   10/12/2018 0039 10/12/2018 1354 Full Code [id number]  Orbie Pyo, MD ED   05/16/2018 1009 05/16/2018 1611 Full Code [id number]  Schuyler Amor, MD ED   03/12/2018 1605 03/16/2018 2212 DNR [id number]  Jimmy Footman, NP Inpatient   03/08/2018 0950 03/12/2018 1605 Full Code [id number]  Harrie Foreman, MD Inpatient   03/07/2018 1607 03/08/2018 0950 Full Code [id number]  [name], MD ED   11/23/2017 1502 11/26/2017 1642 Full Code [id number]  Vaughan Basta, MD Inpatient   09/22/2017 1202 09/22/2017 2017 Full Code [id number]  Nena Polio, MD ED   09/16/2017 2344 09/18/2017 1550 Full Code [id number]  Lance Coon, MD Inpatient   09/16/2017 2037 09/16/2017 2343 Full Code [id number]  Orbie Pyo, MD ED   12/22/2016 1126 12/25/2016 1910 DNR [id number]  [name], MD Inpatient   12/17/2016 1147 12/22/2016 1126 Full Code [id number]  Henreitta Leber, MD Inpatient   09/12/2015 2108 09/15/2015 1501 Full Code [id number]  Henreitta Leber, MD Inpatient   07/09/2015 1559 07/11/2015 2110 Full Code [id number]  Bettey Costa, MD Inpatient   Advance Care Planning Activity     Family Communication: Spoke with daughter on the phone yesterday Disposition Plan: Patient will need to go through alcohol withdrawal while here in the hospital.  Not safe for discharge home at this point.  Need physical therapy and psychiatry evaluations.  Consultants:  Palliative care  Psychiatry  Time spent: 26 minutes  [place]

## 2019-12-10 NOTE — Progress Notes (Signed)
Physical Therapy Evaluation Patient Details Name: Douglas Peterson. MRN: [id number] DOB: Feb 12, 1951 Today's Date: 12/10/2019   History of Present Illness  per MD note:Edward Macalister Arnaud. is a 69 y.o. male with medical history significant of EtOh abuse, HTN,Hyponatremia, hx of CVA with right side weakness  Clinical Impression  PT examination reveals grossly weak LE's with heavy LUE reliance for sit to stand transfers. Patient has significantly impaired balance in sitting and standing, decreased LE power noted  with primary difficulty with performing sit to stand and then difficulty standing upright and getting his knees straight to maintain standing with RW for support .[name] not able to take any steps as he cannot balance without mod support and RW. [name] Pt will benefit from skilled PT services to address deficits in balance and strength in order to improve function and decrease fall risk.      Follow Up Recommendations SNF    Equipment Recommendations       Recommendations for Other Services       Precautions / Restrictions Precautions Precautions: None Restrictions Weight Bearing Restrictions: No      Mobility  Bed Mobility Overal bed mobility: Needs Assistance Bed Mobility: Supine to Sit;Sit to Supine     Supine to sit: Mod assist Sit to supine: Mod assist   General bed mobility comments: slow and difficult  Transfers Overall transfer level: Needs assistance Equipment used: Rolling walker (2 wheeled) Transfers: Sit to/from Stand Sit to Stand: Mod assist         General transfer comment: slow and not able to come to a full stand   Ambulation/Gait Ambulation/Gait assistance: (not able)              Stairs            Wheelchair Mobility    Modified Rankin (Stroke Patients Only)       Balance Overall balance assessment: Needs assistance Sitting-balance support: Single extremity supported;Feet supported Sitting balance-Leahy Scale: Fair    Postural control: Posterior lean   Standing balance-Leahy Scale: Fair                               Pertinent Vitals/Pain Pain Assessment: No/denies pain    Home Living Family/patient expects to be discharged to:: Private residence Living Arrangements: Alone Available Help at Discharge: Other (Comment)(sister comes in 1 x week) Type of Home: House Home Access: Stairs to enter Entrance Stairs-Rails: Right;Left;Can reach both Entrance Stairs-Number of Steps: 4 Home Layout: One level Home Equipment: Walker - 2 wheels;Cane - single point;Tub bench;Grab bars - tub/shower      Prior Function Level of Independence: Independent with assistive device(s)               Hand Dominance   Dominant Hand: (difficult to understand)    Extremity/Trunk Assessment   Upper Extremity Assessment Upper Extremity Assessment: Generalized weakness    Lower Extremity Assessment Lower Extremity Assessment: Generalized weakness(-3/5 BLE)       Communication   Communication: Other (comment)  Cognition Arousal/Alertness: Awake/alert Behavior During Therapy: WFL for tasks assessed/performed Overall Cognitive Status: Within Functional Limits for tasks assessed                                        General Comments      Exercises General Exercises - Lower Extremity Ankle  Circles/Pumps: AAROM;Strengthening;Right;Left;10 reps;Supine Straight Leg Raises: AAROM;Strengthening;Right;Left;10 reps   Assessment/Plan    PT Assessment Patient needs continued PT services  PT Problem List Decreased strength;Decreased activity tolerance;Decreased balance;Decreased mobility       PT Treatment Interventions Gait training;Functional mobility training;Therapeutic activities;Therapeutic exercise    PT Goals (Current goals can be found in the Care Plan section)  Acute Rehab PT Goals Patient Stated Goal: to walk PT Goal Formulation: Patient unable to participate in  goal setting Time For Goal Achievement: 12/24/19 Potential to Achieve Goals: Fair    Frequency Min 2X/week   Barriers to discharge Decreased caregiver support      Co-evaluation               AM-PAC PT "6 Clicks" Mobility  Outcome Measure Help needed turning from your back to your side while in a flat bed without using bedrails?: A Little Help needed moving from lying on your back to sitting on the side of a flat bed without using bedrails?: A Lot Help needed moving to and from a bed to a chair (including a wheelchair)?: Total Help needed standing up from a chair using your arms (e.g., wheelchair or bedside chair)?: Total Help needed to walk in hospital room?: Total Help needed climbing 3-5 steps with a railing? : Total 6 Click Score: 9    End of Session Equipment Utilized During Treatment: Gait belt Activity Tolerance: No increased pain Patient left: in bed;with bed alarm set   PT Visit Diagnosis: Unsteadiness on feet (R26.81);Other abnormalities of gait and mobility (R26.89);Muscle weakness (generalized) (M62.81);Difficulty in walking, not elsewhere classified (R26.2)    Time: 4734-0370 PT Time Calculation (min) (ACUTE ONLY): 37 min   Charges:   PT Evaluation $PT Eval Low Complexity: 1 Low PT Treatments $Therapeutic Activity: 23-37 mins          Paragon, Sherryl Barters, PT DPT 12/10/2019, 1:49 PM

## 2019-12-11 DIAGNOSIS — F10231 Alcohol dependence with withdrawal delirium: Principal | ICD-10-CM

## 2019-12-11 MED ORDER — ATENOLOL 25 MG PO TABS
25.0000 mg | ORAL_TABLET | Freq: Every day | ORAL | Status: DC
  Administered 2019-12-12: 25 mg via ORAL
  Filled 2019-12-11: qty 1
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
## 2019-12-12 DIAGNOSIS — R251 Tremor, unspecified: Secondary | ICD-10-CM

## 2019-12-12 DIAGNOSIS — Z515 Encounter for palliative care: Secondary | ICD-10-CM

## 2019-12-12 DIAGNOSIS — Z7189 Other specified counseling: Secondary | ICD-10-CM

## 2019-12-12 LAB — SARS CORONAVIRUS 2 (TAT 6-24 HRS): SARS Coronavirus 2: NEGATIVE
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
## 2019-12-12 NOTE — Progress Notes (Signed)
Occupational Therapy Treatment Patient Details Name: Douglas Peterson. MRN: [id number] DOB: 1951-02-22 Today's Date: 12/12/2019    History of present illness Douglas Peterson. is a 69 y.o. male with medical history significant of EtOh abuse, HTN,Hyponatremia, hx of CVA with right side weakness. Pt presented to the ED voluntarily reporting alcohol abuse and wanting to detox.   OT comments  Pt seen for OT treatment this date to f/u re: safety and independence with self care ADLs/ADL mobility. Pt demos good command following. Performs sup to sit with with MIN A with HOB elevated. Pt seated EOB, participates in UB bathing/dressing with MIN/MOD A, requires MAX A for LB bathing/drying, MOD A for ADL transfer-sit to stand with RW, SPS to chair with arm in arm technique. Pt left sitting up in chair with chair alarm, RN notified of OT session and pt status. Anticipate SNF is most prudent placement at d/c given pt's current fxl status and that he lives alone.    Follow Up Recommendations  SNF    Equipment Recommendations  Other (comment)(defer to next level of care)    Recommendations for Other Services      Precautions / Restrictions Precautions Precautions: Fall Restrictions Weight Bearing Restrictions: No       Mobility Bed Mobility Overal bed mobility: Needs Assistance Bed Mobility: Supine to Sit     Supine to sit: Min assist;HOB elevated     General bed mobility comments: Min A with HOB elevated for trunk control  Transfers Overall transfer level: Needs assistance Equipment used: Rolling walker (2 wheeled) Transfers: Sit to/from [facility] Sit to Stand: Mod assist Stand pivot transfers: Mod assist(arm in arm rather than RW for SPS)          Balance Overall balance assessment: Needs assistance Sitting-balance support: Single extremity supported;Feet supported Sitting balance-Leahy Scale: Fair   Postural control: Posterior lean Standing balance support:  Bilateral upper extremity supported;During functional activity Standing balance-Leahy Scale: Poor                            ADL either performed or assessed with clinical judgement   ADL                                         General ADL Comments: Pt requires MIN/MOD A with UB grooming/bathing/dressing in EOB sitting, MAX A for LB bathing/drying using sit/lateral lean technique. Pt requires MOD A with arm in arm for SPS ADL transfer from bed to chair and MOD A for scooting using one hip weight shift method. Fxl mobility not assessed at this time as pt with very poor standing balance.     Vision Patient Visual Report: No change from baseline     Perception     Praxis      Cognition Arousal/Alertness: Awake/alert Behavior During Therapy: WFL for tasks assessed/performed Overall Cognitive Status: Within Functional Limits for tasks assessed                                 General Comments: some garbled speech, but A&O and appropriate with command following.        Exercises Other Exercises Other Exercises: OT facilitates education re: safety with hand placement with SPS transfer to chair. Pt demos good understanding and sequencing with cues.  Other Exercises: R hand positioning education to address finger flexion contractures   Shoulder Instructions       General Comments      Pertinent Vitals/ Pain       Pain Assessment: No/denies pain  Home Living                                          Prior Functioning/Environment              Frequency  Min 2X/week        Progress Toward Goals  OT Goals(current goals can now be found in the care plan section)  Progress towards OT goals: Progressing toward goals  Acute Rehab OT Goals Patient Stated Goal: Want to get better, walk, help myself OT Goal Formulation: With patient Time For Goal Achievement: 12/24/19 Potential to Achieve Goals: Good  Plan  Discharge plan remains appropriate    Co-evaluation                 AM-PAC OT "6 Clicks" Daily Activity     Outcome Measure   Help from another person eating meals?: A Little Help from another person taking care of personal grooming?: A Lot Help from another person toileting, which includes using toliet, bedpan, or urinal?: A Lot Help from another person bathing (including washing, rinsing, drying)?: A Lot Help from another person to put on and taking off regular upper body clothing?: A Lot Help from another person to put on and taking off regular lower body clothing?: A Lot 6 Click Score: 13    End of Session Equipment Utilized During Treatment: Gait belt  OT Visit Diagnosis: Unsteadiness on feet (R26.81);Other abnormalities of gait and mobility (R26.89);Repeated falls (R29.6);Muscle weakness (generalized) (M62.81);History of falling (Z91.81);Hemiplegia and hemiparesis Hemiplegia - Right/Left: Right Hemiplegia - dominant/non-dominant: Dominant   Activity Tolerance Patient tolerated treatment well   Patient Left in chair;with call bell/phone within reach;with chair alarm set   Nurse Communication          Time: 7616-0737 OT Time Calculation (min): 38 min  Charges: OT General Charges $OT Visit: 1 Visit OT Treatments $Self Care/Home Management : 23-37 mins $Therapeutic Activity: 8-22 mins  Gerrianne Scale, Index, OTR/L ascom [phone] 12/12/19, 3:40 PM

## 2019-12-12 NOTE — Progress Notes (Addendum)
Patient ID: Douglas Peterson., male   DOB: Dec 07, 1950, 69 y.o.   MRN: [id number] [facility] PROGRESS NOTE  Douglas Peterson. QZR:[id number] DOB: 1950-11-13 DOA: 12/06/2019 PCP: [name], MD  HPI/Subjective: Patient answers a few yes or no questions.  Unable to elaborate much.  Objective: Vitals:   12/12/19 0529 12/12/19 1229  BP: 121/89 99/68  Pulse: (!) 57 (!) 58  Resp: 20 16  Temp: 97.7 F (36.5 C) 97.8 F (36.6 C)  SpO2: 98% 97%    Intake/Output Summary (Last 24 hours) at 12/12/2019 1552 Last data filed at 12/12/2019 1300 Gross per 24 hour  Intake 780 ml  Output 750 ml  Net 30 ml   Filed Weights   12/06/19 2111 12/12/19 0133  Weight: 56 kg 53 kg    ROS: Review of Systems  Constitutional: Negative for fever.  Eyes: Negative for blurred vision.  Respiratory: Negative for cough and shortness of breath.   Cardiovascular: Negative for chest pain.  Gastrointestinal: Negative for abdominal pain, diarrhea and vomiting.  Genitourinary: Negative for dysuria.  Musculoskeletal: Negative for joint pain.  Neurological: Negative for headaches.   Exam: Physical Exam  HENT:  Nose: No mucosal edema.  Mouth/Throat: No oropharyngeal exudate.  Eyes: Conjunctivae and lids are normal.  Neck: Carotid bruit is not present.  Cardiovascular: S1 normal and S2 normal. Bradycardia present. Exam reveals no gallop.  No murmur heard. Respiratory: No respiratory distress. He has no decreased breath sounds. He has no wheezes. He has no rhonchi. He has no rales.  GI: Soft. Bowel sounds are normal. There is no abdominal tenderness.  Musculoskeletal:     Right ankle: No swelling.     Left ankle: No swelling.  Lymphadenopathy:    He has no cervical adenopathy.  Neurological: He is alert.  Able to straight leg raise bilaterally. Right arm contracted.  Skin: Skin is warm. No rash noted. Nails show no clubbing.  Psychiatric: He has a normal mood and affect.      Data Reviewed: Basic  Metabolic Panel: Recent Labs  Lab 12/06/19 2138 12/08/19 1920 12/09/19 1421 12/10/19 0647  NA 137 132*  --  138  K 3.7 3.3*  --  3.9  CL 91* 95*  --  107  CO2 30 28  --  22  GLUCOSE 108* 115*  --  78  BUN <5* <5*  --  9  CREATININE 0.43* 0.47*  --  0.53*  CALCIUM 8.8* 8.8*  --  8.7*  MG  --  1.8 2.3  --   PHOS  --  2.8 3.1  --    Liver Function Tests: Recent Labs  Lab 12/06/19 2138 12/08/19 1920 12/10/19 0647  AST 62* 57* 39  ALT 38 31 26  ALKPHOS 122 110 97  BILITOT 0.6 1.6* 1.4*  PROT 8.6* 7.4 6.9  ALBUMIN 4.8 4.1 3.6   CBC: Recent Labs  Lab 12/06/19 2138 12/08/19 1920 12/09/19 1421  WBC 11.0* 7.2 6.4  NEUTROABS 4.0 4.1  --   HGB 16.9 14.2 13.6  HCT 50.0 41.3 39.2  MCV 91.7 91.0 91.6  PLT 137* 95* 84*   Cardiac Enzymes: Recent Labs  Lab 12/08/19 1920 12/10/19 0647  CKTOTAL 827* 257     Recent Results (from the past 240 hour(s))  Respiratory Panel by RT PCR (Flu A&B, Covid) - Nasopharyngeal Swab     Status: None   Collection Time: 12/07/19  6:40 AM   Specimen: Nasopharyngeal Swab  Result Value Ref Range Status  SARS Coronavirus 2 by RT PCR NEGATIVE NEGATIVE Final    Comment: (NOTE) SARS-CoV-2 target nucleic acids are NOT DETECTED. The SARS-CoV-2 RNA is generally detectable in upper respiratoy specimens during the acute phase of infection. The lowest concentration of SARS-CoV-2 viral copies this assay can detect is 131 copies/mL. A negative result does not preclude SARS-Cov-2 infection and should not be used as the sole basis for treatment or other patient management decisions. A negative result may occur with  improper specimen collection/handling, submission of specimen other than nasopharyngeal swab, presence of viral mutation(s) within the areas targeted by this assay, and inadequate number of viral copies (<131 copies/mL). A negative result must be combined with clinical observations, patient history, and epidemiological information.  The expected result is Negative. Fact Sheet for Patients:  PinkCheek.be Fact Sheet for Healthcare Providers:  GravelBags.it This test is not yet ap proved or cleared by the [place] FDA and  has been authorized for detection and/or diagnosis of SARS-CoV-2 by FDA under an Emergency Use Authorization (EUA). This EUA will remain  in effect (meaning this test can be used) for the duration of the COVID-19 declaration under Section 564(b)(1) of the Act, 21 U.S.C. section 360bbb-3(b)(1), unless the authorization is terminated or revoked sooner.    Influenza A by PCR NEGATIVE NEGATIVE Final   Influenza B by PCR NEGATIVE NEGATIVE Final    Comment: (NOTE) The Xpert Xpress SARS-CoV-2/FLU/RSV assay is intended as an aid in  the diagnosis of influenza from Nasopharyngeal swab specimens and  should not be used as a sole basis for treatment. Nasal washings and  aspirates are unacceptable for Xpert Xpress SARS-CoV-2/FLU/RSV  testing. Fact Sheet for Patients: PinkCheek.be Fact Sheet for Healthcare Providers: GravelBags.it This test is not yet approved or cleared by the [place] FDA and  has been authorized for detection and/or diagnosis of SARS-CoV-2 by  FDA under an Emergency Use Authorization (EUA). This EUA will remain  in effect (meaning this test can be used) for the duration of the  Covid-19 declaration under Section 564(b)(1) of the Act, 21  U.S.C. section 360bbb-3(b)(1), unless the authorization is  terminated or revoked. Performed at [facility], [place], [place] [address]   SARS CORONAVIRUS 2 (TAT 6-24 HRS) Nasopharyngeal Nasopharyngeal Swab     Status: None   Collection Time: 12/11/19 11:48 AM   Specimen: Nasopharyngeal Swab  Result Value Ref Range Status   SARS Coronavirus 2 NEGATIVE NEGATIVE Final    Comment: (NOTE) SARS-CoV-2  target nucleic acids are NOT DETECTED. The SARS-CoV-2 RNA is generally detectable in upper and lower respiratory specimens during the acute phase of infection. Negative results do not preclude SARS-CoV-2 infection, do not rule out co-infections with other pathogens, and should not be used as the sole basis for treatment or other patient management decisions. Negative results must be combined with clinical observations, patient history, and epidemiological information. The expected result is Negative. Fact Sheet for Patients: SugarRoll.be Fact Sheet for Healthcare Providers: https://www.woods-mathews.com/ This test is not yet approved or cleared by the [place] FDA and  has been authorized for detection and/or diagnosis of SARS-CoV-2 by FDA under an Emergency Use Authorization (EUA). This EUA will remain  in effect (meaning this test can be used) for the duration of the COVID-19 declaration under Section 56 4(b)(1) of the Act, 21 U.S.C. section 360bbb-3(b)(1), unless the authorization is terminated or revoked sooner. Performed at [facility], [place] [address], [place], [place] [address]  Scheduled Meds: . atenolol  25 mg Oral Daily  . feeding supplement (ENSURE ENLIVE)  237 mL Oral BID BM  . folic acid  1 mg Oral Daily  . multivitamin with minerals  1 tablet Oral Daily  . PARoxetine  30 mg Oral Daily  . thiamine  100 mg Oral Daily   Continuous Infusions:   Assessment/Plan:  1. Alcohol withdrawal.  Patient on alcohol withdrawal protocol with librium.  Patient more alert today than yesterday.  Psychaitry note yesterday showed that he does not have capacity to make medical decisions for himself. 2. Essential hypertension and tremor. I will discontinue b-blocker at this time. 3. Hypomagnesemia replaced 4. Hypokalemia replaced 5. Mild rhabdomyolysis.  This has resolved. 6. Cirrhosis of liver with pancytopenia 7. Weakness.   Physical therapy recommends rehab. 8. Palliative care consultation appreciated  Code Status:     Code Status Orders  (From admission, onward)         Start     Ordered   12/09/19 1237  Full code  Continuous     12/09/19 1237        Code Status History    Date Active Date Inactive Code Status Order ID Comments User Context   12/07/2019 0618 12/09/2019 1237 Full Code [id number]  Gregor Hams, MD ED   09/20/2019 2314 10/03/2019 1741 Full Code [id number]  [name], MD ED   08/14/2019 1932 08/25/2019 2145 Full Code [id number]  Loletha Grayer, MD ED   08/14/2019 1840 08/14/2019 1932 Full Code [id number]  Blake Divine, MD ED   06/12/2019 2308 06/21/2019 2018 Full Code [id number]  Dustin Flock, MD Inpatient   03/29/2019 1337 04/01/2019 1939 DNR [id number]  Philis Pique, NP Inpatient   03/20/2019 1510 03/29/2019 1337 Full Code [id number]  Mayo, Pete Pelt, MD Inpatient   03/20/2019 0507 03/20/2019 1509 Full Code [id number]  Gregor Hams, MD ED   11/16/2018 0101 11/23/2018 1455 Full Code [id number]  Gladstone Lighter, MD ED   10/12/2018 1355 10/13/2018 1839 Full Code [id number]  Arta Silence, MD Inpatient   10/12/2018 0039 10/12/2018 1354 Full Code [id number]  Orbie Pyo, MD ED   05/16/2018 1009 05/16/2018 1611 Full Code [id number]  Schuyler Amor, MD ED   03/12/2018 1605 03/16/2018 2212 DNR [id number]  Jimmy Footman, NP Inpatient   03/08/2018 0950 03/12/2018 1605 Full Code [id number]  Harrie Foreman, MD Inpatient   03/07/2018 1607 03/08/2018 0950 Full Code [id number]  [name], MD ED   11/23/2017 1502 11/26/2017 1642 Full Code [id number]  Vaughan Basta, MD Inpatient   09/22/2017 1202 09/22/2017 2017 Full Code [id number]  Nena Polio, MD ED   09/16/2017 2344 09/18/2017 1550 Full Code [id number]  Lance Coon, MD Inpatient   09/16/2017 2037 09/16/2017 2343 Full Code [id number]  Orbie Pyo, MD ED   12/22/2016 1126 12/25/2016  1910 DNR [id number]  [name], MD Inpatient   12/17/2016 1147 12/22/2016 1126 Full Code [id number]  Henreitta Leber, MD Inpatient   09/12/2015 2108 09/15/2015 1501 Full Code [id number]  Henreitta Leber, MD Inpatient   07/09/2015 1559 07/11/2015 2110 Full Code [id number]  Bettey Costa, MD Inpatient   Advance Care Planning Activity     Family Communication: Left message for daughter on the phone. Disposition Plan: Out to rehab tomorrow  Consultants:  Palliative care  Psychiatry  Time spent: 27 minutes, case discussed with [facility] team and palliative care.  [place]  [facility] [employer]

## 2019-12-12 NOTE — Progress Notes (Signed)
Physical Therapy Treatment Patient Details Name: Douglas Peterson. MRN: [id number] DOB: [birth year] Today's Date: 12/12/2019    History of Present Illness Douglas Peterson. is a 69 y.o. male with medical history significant of EtOh abuse, HTN,Hyponatremia, hx of CVA with right side weakness. Pt presented to the ED voluntarily reporting alcohol abuse and wanting to detox.    PT Comments    Pt pleasant and motivated to participate during the session.  Pt put forth good effort during the session but ultimately required physical assistance for all mobility tasks.  Pt presented with heavy posterior lean in standing upon initial transfer attempt but after participating in anterior weight shifting activities the patient progressed from requiring constant Mod A to prevent LOB to occasional min A.  Overall the patient was at a very high risk for falls and would not be safe to return to his prior living situation.  Pt will benefit from PT services in a SNF setting upon discharge to safely address deficits listed in patient problem list for decreased caregiver assistance and eventual return to PLOF.     Follow Up Recommendations  SNF     Equipment Recommendations  None recommended by PT    Recommendations for Other Services       Precautions / Restrictions Precautions Precautions: Fall Restrictions Weight Bearing Restrictions: No    Mobility  Bed Mobility Overal bed mobility: Needs Assistance Bed Mobility: Supine to Sit     Supine to sit: Min assist;HOB elevated     General bed mobility comments: Min A with HOB elevated for trunk control  Transfers Overall transfer level: Needs assistance Equipment used: Rolling walker (2 wheeled) Transfers: Sit to/from [facility] Sit to Stand: Mod assist Stand pivot transfers: Mod assist(arm in arm rather than RW for SPS)       General transfer comment: Mod A to stand and to prevent posterior LOB in  standing  Ambulation/Gait Ambulation/Gait assistance: Min assist Gait Distance (Feet): 15 Feet Assistive device: Rolling walker (2 wheeled) Gait Pattern/deviations: Step-to pattern;Decreased step length - right Gait velocity: decreased   General Gait Details: Mod verbal cues for increased RLE step length and amb closer to RW most notably during turns   Marine scientist [place] (Stroke Patients Only)       Balance Overall balance assessment: Needs assistance Sitting-balance support: Single extremity supported;Feet supported Sitting balance-Leahy Scale: Fair   Postural control: Posterior lean Standing balance support: Bilateral upper extremity supported;During functional activity Standing balance-Leahy Scale: Poor Standing balance comment: Mod A initially to prevent posterior LOB in standing but improved after anterior weight shifting activities                            Cognition Arousal/Alertness: Awake/alert Behavior During Therapy: WFL for tasks assessed/performed Overall Cognitive Status: Within Functional Limits for tasks assessed                                 General Comments: some garbled speech, but A&O and appropriate with command following.      Exercises Total Joint Exercises Ankle Circles/Pumps: AROM;Strengthening;Both;10 reps Quad Sets: Strengthening;Both;10 reps Gluteal Sets: Strengthening;Both;10 reps Long Arc Quad: Strengthening;AROM;Both;10 reps Knee Flexion: AROM;Strengthening;Both;10 reps Other Exercises Other Exercises: OT facilitates education re: safety with hand placement with SPS transfer to  chair. Pt demos good understanding and sequencing with cues. Other Exercises: R hand positioning education to address finger flexion contractures    General Comments        Pertinent Vitals/Pain Pain Assessment: No/denies pain    Home Living                      Prior  Function            PT Goals (current goals can now be found in the care plan section) Progress towards PT goals: Progressing toward goals    Frequency    Min 2X/week      PT Plan Current plan remains appropriate    Co-evaluation              AM-PAC PT "6 Clicks" Mobility   Outcome Measure  Help needed turning from your back to your side while in a flat bed without using bedrails?: A Little Help needed moving from lying on your back to sitting on the side of a flat bed without using bedrails?: A Little Help needed moving to and from a bed to a chair (including a wheelchair)?: A Lot Help needed standing up from a chair using your arms (e.g., wheelchair or bedside chair)?: A Lot Help needed to walk in hospital room?: A Lot Help needed climbing 3-5 steps with a railing? : Total 6 Click Score: 13    End of Session Equipment Utilized During Treatment: Gait belt Activity Tolerance: Patient tolerated treatment well Patient left: in chair;with call bell/phone within reach;with chair alarm set Nurse Communication: Mobility status PT Visit Diagnosis: Unsteadiness on feet (R26.81);Other abnormalities of gait and mobility (R26.89);Muscle weakness (generalized) (M62.81);Difficulty in walking, not elsewhere classified (R26.2)     Time: 2836-6294 PT Time Calculation (min) (ACUTE ONLY): 27 min  Charges:  $Gait Training: 8-22 mins $Therapeutic Exercise: 8-22 mins                     D. Scott  PT, DPT 12/12/19, 3:25 PM

## 2019-12-12 NOTE — NC FL2 (Signed)
[place] LEVEL OF CARE SCREENING TOOL     IDENTIFICATION  Patient Name: Douglas Peterson. Birthdate: 18-Nov-1950 Sex: male Admission Date (Current Location): 12/06/2019  [facility] and [place] Number:  Engineering geologist and Address:         Provider Number: [id number]  Attending Physician Name and Address:  Loletha Grayer, MD  Relative Name and Phone Number:       Current Level of Care: Hospital Recommended Level of Care: [place] Prior Approval Number:    Date Approved/Denied: 12/12/19 PASRR Number: [id number]  Discharge Plan: SNF    Current Diagnoses: Patient Active Problem List   Diagnosis Date Noted  . Pancytopenia ([place])   . Hypomagnesemia   . Alcoholic cirrhosis of liver without ascites ([place])   . Thrombocytopenia ([place])   . Non-traumatic rhabdomyolysis   . Alcohol withdrawal delirium ([place]) 12/08/2019  . Alcohol abuse   . Alcohol intoxication, uncomplicated ([place])   . 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 Hypokalemia 09/27/2019  . Dysphagia 09/27/2019  . Alcohol withdrawal delirium, acute, hyperactive ([place]) 09/21/2019  . Pressure injury of skin 08/15/2019  . Goals of care, counseling/discussion   . Palliative care by specialist   . Alcohol withdrawal ([place]) 03/20/2019  . Low back pain 11/15/2018  . AMS (altered mental status) 10/12/2018  . Delirium tremens ([place]) 03/08/2018  . Malnutrition of moderate degree 11/24/2017  . HTN (hypertension) 09/16/2017  . Hypothermia 12/17/2016  . Alcohol use   . Diarrhea   . Hyponatremia 07/09/2015    Orientation RESPIRATION BLADDER Height & Weight     Self, Situation, Place  Normal Incontinent, External catheter Weight: 53 kg Height:  5' 4"  (162.6 cm)  BEHAVIORAL SYMPTOMS/MOOD NEUROLOGICAL BOWEL NUTRITION STATUS      Continent Diet(regular)  AMBULATORY STATUS COMMUNICATION OF NEEDS Skin   Extensive Assist Verbally Normal                        Personal Care Assistance Level of Assistance              Functional Limitations Info             SPECIAL CARE FACTORS FREQUENCY  OT (By licensed OT), PT (By licensed PT)                    Contractures Contractures Info: Not present    Additional Factors Info  Code Status, Allergies Code Status Info: full Allergies Info: NKDA           Current Medications (12/12/2019):  This is the current hospital active medication list Current Facility-Administered Medications  Medication Dose Route Frequency Provider Last Rate Last Admin  . acetaminophen (TYLENOL) tablet 650 mg  650 mg Oral Q6H PRN Toy Baker, MD   650 mg at 12/12/19 0535   Or  . acetaminophen (TYLENOL) suppository 650 mg  650 mg Rectal Q6H PRN Doutova, Anastassia, MD      . atenolol (TENORMIN) tablet 25 mg  25 mg Oral Daily Loletha Grayer, MD   25 mg at 12/12/19 2878  . chlordiazePOXIDE (LIBRIUM) capsule 50 mg  50 mg Oral TID PRN Toy Baker, MD   50 mg at 12/09/19 1232  . feeding supplement (ENSURE ENLIVE) (ENSURE ENLIVE) liquid 237 mL  237 mL Oral BID BM Loletha Grayer, MD   237 mL at 12/12/19 0819  . folic acid (FOLVITE) tablet 1 mg  1 mg Oral  Daily Toy Baker, MD   1 mg at 12/12/19 0600  . multivitamin with minerals tablet 1 tablet  1 tablet Oral Daily Toy Baker, MD   1 tablet at 12/12/19 0819  . ondansetron (ZOFRAN) tablet 4 mg  4 mg Oral Q6H PRN Doutova, Anastassia, MD       Or  . ondansetron (ZOFRAN) injection 4 mg  4 mg Intravenous Q6H PRN Doutova, Anastassia, MD      . PARoxetine (PAXIL) tablet 30 mg  30 mg Oral Daily Doutova, Anastassia, MD   30 mg at 12/12/19 0820  . thiamine tablet 100 mg  100 mg Oral Daily Lu Duffel, [place]   100 mg at 12/12/19 4599     Discharge Medications: Please see discharge summary for a list of discharge medications.  Relevant Imaging Results:  Relevant Lab Results:   Additional Information SS#  [id number]  Beverly Sessions, RN

## 2019-12-13 DIAGNOSIS — R531 Weakness: Secondary | ICD-10-CM

## 2019-12-13 MED ORDER — ACETAMINOPHEN 325 MG PO TABS: 650.0000 mg | ORAL_TABLET | Freq: Four times a day (QID) | ORAL | Status: DC | PRN

## 2019-12-13 MED ORDER — FOLIC ACID 1 MG PO TABS: 1.0000 mg | ORAL_TABLET | Freq: Every day | ORAL | Status: DC
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
## 2019-12-13 NOTE — Progress Notes (Signed)
Douglas Peterson. to be D/C'd to [facility] per MD order.  Discussed prescriptions with the patient and medication list explained in detail. Pt verbalized understanding. Called patient's daughter to update her on plan but was unable to reach her.  Allergies as of 12/13/2019   No Known Allergies     Medication List    STOP taking these medications   aspirin EC 81 MG tablet   atenolol 25 MG tablet Commonly known as: TENORMIN   baclofen 10 MG tablet Commonly known as: LIORESAL   CALCIUM 1200 PO   diltiazem 120 MG 24 hr capsule Commonly known as: TIAZAC   doxepin 10 MG capsule Commonly known as: SINEQUAN   hydrOXYzine 25 MG tablet Commonly known as: ATARAX/VISTARIL   Nat-Rul Vitamin D 125 MCG (5000 UT) Tabs Generic drug: Cholecalciferol   vitamin C 500 MG tablet Commonly known as: ASCORBIC ACID     TAKE these medications   acetaminophen 325 MG tablet Commonly known as: TYLENOL Take 2 tablets (650 mg total) by mouth every 6 (six) hours as needed for mild pain (or Fever >/= 101).   feeding supplement (ENSURE ENLIVE) Liqd Take 237 mLs by mouth 2 (two) times daily between meals.   folic acid 1 MG tablet Commonly known as: FOLVITE Take 1 tablet (1 mg total) by mouth daily. What changed:   medication strength  how much to take   multivitamin with minerals Tabs tablet Take 1 tablet by mouth daily.   PARoxetine 30 MG tablet Commonly known as: PAXIL Take 30 mg by mouth daily.   thiamine 100 MG tablet Take 1 tablet (100 mg total) by mouth daily.       Vitals:   12/13/19 0543 12/13/19 1033  BP: 122/81 116/72  Pulse: (!) 53 64  Resp: 16 16  Temp: 97.8 F (36.6 C) 97.8 F (36.6 C)  SpO2: 97% 98%    Skin clean, dry and intact without evidence of skin break down, no evidence of skin tears noted. IV catheter discontinued intact. Site without signs and symptoms of complications. Dressing and pressure applied. Pt denies pain at this time. No complaints  noted.  An After Visit Summary was printed and given to EMS. Patient escorted via EMS, and D/C to [facility] via private auto.  Douglas Peterson

## 2019-12-13 NOTE — Progress Notes (Signed)
Report given by phone to Maudie Mercury, RN at [facility].  Marry Guan, RN 12/13/2019 9:43 AM

## 2019-12-13 NOTE — Discharge Instructions (Signed)
No Driving  Alcohol Withdrawal Syndrome Alcohol withdrawal syndrome is a group of symptoms that can develop when a person who drinks heavily and regularly stops drinking or drinks less. Alcohol withdrawal syndrome can be mild or severe, and it may even be life-threatening. Alcohol withdrawal syndrome usually affects people who have alcohol use disorder, which may also be called alcoholism. Alcohol use disorder is when a person is unable to control his or her alcohol use, and drinking too much or too often causes problems at home, at work, or in relationships. What are the causes? Drinking heavily and drinking on a regular basis cause changes in brain chemistry. Over time, the body becomes dependent on alcohol. When alcohol use stops, the chemistry system in the brain becomes unbalanced and causes the symptoms of alcohol withdrawal. What increases the risk? Alcohol withdrawal syndrome is more likely to occur in people who drink more than the recommended limit of alcohol (2 drinks a day for men or 1 drink a day for non-pregnant women). It is also more likely to affect heavy drinkers who have been using alcohol for long periods of time. The more a person drinks and the longer he or she drinks, the greater the risk of alcohol withdrawal syndrome. Severe withdrawal is more likely to develop in someone who:  Had severe alcohol withdrawal in the past.  Had a seizure during a previous episode of alcohol withdrawal.  Is elderly.  Uses other drugs.  Has a long-term (chronic) medical problem, such as heart, lung, or liver disease.  Has depression.  Does not get enough nutrients from his or her diet (malnutrition). What are the signs or symptoms? Symptoms of this condition can be mild to moderate, or they can be severe. Symptoms may develop a few hours (or up to a day) after a person changes his or her drinking patterns. During the 48 hours after he or she has stopped drinking, the following symptoms  may go away or get better:  Uncontrollable shaking (tremor).  Sweating.  Headache.  Anxiety.  Inability to relax (agitation).  Trouble sleeping (insomnia).  Irregular heartbeats (palpitations).  Alcohol cravings.  Seizure. The following symptoms may get worse 24-48 hours after a person has decreased or stopped alcohol use, and they may gradually improve over a period of days or weeks:  Nausea and vomiting.  Fatigue.  Sensitivity to light and sounds.  Confusion and inability to think clearly.  Loss of appetite.  Mood swings, irritability, depression, and anxiety.  Insomnia and nightmares. The following symptoms are severe and life-threatening. When these symptoms occur together, they are called delirium tremens (DTs):  High blood pressure.  Increased heart rate.  Trouble breathing.  Seizures. These may go away along with other symptoms, or they may persist.  Seeing, hearing, feeling, smelling, or tasting things that are not there (hallucinations). If you experience hallucinations, they usually begin 12-24 hours after a change in drinking patterns. Delirium tremens requires immediate hospitalization. How is this diagnosed? This condition may be diagnosed based on:  Your symptoms and medical history.  Your history of alcohol use. Your health care provider may ask questions about your drinking behavior. It is important to be honest when you answer these questions.  A psychological assessment.  A physical exam.  Blood tests or urine tests to measure blood alcohol level and to rule out other causes of symptoms.  MRI or CT scan. This may be done if you seem to have abnormal thinking or behaviors (altered mental status). Diagnosis  can be difficult. People going through withdrawal often avoid seeking medical care and are not thinking clearly. Friends and family members play an important role in recognizing symptoms and encouraging loved ones to get treatment. How  is this treated? Most people with symptoms of withdrawal can be treated outside of a hospital setting (outpatient treatment), with close monitoring such as daily check-ins with a health care provider and counseling. You may need treatment at a hospital or treatment center (inpatient treatment) if:  You have a history of delirium tremens or seizures.  You have severe symptoms.  You are addicted to other drugs.  You cannot swallow medicine.  You have a serious medical condition such as heart failure.  You experienced withdrawal in the past but then you continued drinking alcohol.  You are not likely to commit to an outpatient treatment schedule. Treatment may involve:  Monitoring your blood pressure, pulse, and breathing.  IV fluids to keep you hydrated.  Medicines to reduce withdrawal symptoms and discomfort (benzodiazepines).  Medicine to reduce anxiety.  Medicine to prevent or control seizures.  Multivitamins and B vitamins.  Having a health care provider check on you daily. It is important to get treatment for alcohol withdrawal early. Getting treatment early can:  Speed up your recovery from withdrawal symptoms.  Make you more successful with long-term stoppage of alcohol use (sobriety). If you need help to stop drinking, your health care provider may recommend a long-term treatment plan that includes:  Medicines to help treat alcohol use disorder.  Substance abuse counseling.  Support groups. Follow these instructions at home:   Take over-the-counter and prescription medicines (including vitamin supplements) only as told by your health care provider.  Do not drink alcohol.  Do not drive until your health care provider approves.  Have someone you trust stay with you or be available if you need help with your symptoms or with not drinking.  Drink enough fluid to keep your urine pale yellow.  Consider joining an alcohol support group or treatment program.  These can provide emotional support, advice, and guidance.  Keep all follow-up visits as told by your health care provider. This is important. Contact a health care provider if:  Your symptoms get worse instead of better.  You cannot eat or drink without vomiting.  You are struggling with not drinking alcohol.  You cannot stop drinking alcohol. Get help right away if:  You have an irregular heartbeat.  You have chest pain.  You have trouble breathing.  You have a seizure for the first time.  You hallucinate.  You become very confused. Summary  Alcohol withdrawal is a group of symptoms that can develop when a person who drinks heavily and regularly stops drinking or drinks less.  Symptoms of this condition can be mild to moderate, or they can be severe.  Treatment may include hospitalization, medicine, and counseling. This information is not intended to replace advice given to you by your health care provider. Make sure you discuss any questions you have with your health care provider. Document Revised: 10/02/2017 Document Reviewed: 06/26/2017 Elsevier Patient Education  [place].

## 2019-12-13 NOTE — TOC Transition Note (Signed)
Transition of Care [facility]) - CM/SW Discharge Note   Patient Details  Name: Douglas Peterson. MRN: [id number] Date of Birth: July 09, 1951  Transition of Care [facility]) CM/SW Contact:  Beverly Sessions, RN Phone Number: 12/13/2019, 9:47 AM   Clinical Narrative:     [facility] packet on chart.  Bedside RN notified  Discharge information sent in the hub  Final next level of care: [place]     Patient Goals and CMS Choice        Discharge Placement              Patient chooses bed at: [facility] [place] Patient to be transferred to facility by: EMS      Discharge Plan and Services                                     Social Determinants of Health (SDOH) Interventions     Readmission Risk Interventions Readmission Risk Prevention Plan 12/13/2019 12/12/2019  Transportation Screening Complete Complete  PCP or Specialist Appt within 3-5 Days (No Data) -  HRI or [place] (No Data) -  Palliative Care Screening Complete Complete  Medication Review (RN Care Manager) Complete Complete  Some recent data might be hidden
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
## 2020-01-03 ENCOUNTER — Emergency Department
Admission: EM | Admit: 2020-01-03 | Discharge: 2020-01-03 | Disposition: A | Discharge status: Home / Self Care | Payer: Medicare HMO | Attending: Emergency Medicine | Admitting: Emergency Medicine

## 2020-01-03 ENCOUNTER — Emergency Department: Payer: Medicare HMO

## 2020-01-03 ENCOUNTER — Other Ambulatory Visit: Payer: Self-pay

## 2020-01-03 DIAGNOSIS — F1010 Alcohol abuse, uncomplicated: Secondary | ICD-10-CM | POA: Diagnosis not present

## 2020-01-03 DIAGNOSIS — Z79899 Other long term (current) drug therapy: Secondary | ICD-10-CM | POA: Insufficient documentation

## 2020-01-03 DIAGNOSIS — E860 Dehydration: Secondary | ICD-10-CM | POA: Insufficient documentation

## 2020-01-03 DIAGNOSIS — I10 Essential (primary) hypertension: Secondary | ICD-10-CM | POA: Insufficient documentation

## 2020-01-03 DIAGNOSIS — Z87891 Personal history of nicotine dependence: Secondary | ICD-10-CM | POA: Insufficient documentation

## 2020-01-03 DIAGNOSIS — G8191 Hemiplegia, unspecified affecting right dominant side: Secondary | ICD-10-CM | POA: Diagnosis not present

## 2020-01-03 DIAGNOSIS — Y906 Blood alcohol level of 120-199 mg/100 ml: Secondary | ICD-10-CM | POA: Diagnosis not present

## 2020-01-03 DIAGNOSIS — R1084 Generalized abdominal pain: Secondary | ICD-10-CM | POA: Diagnosis present

## 2020-01-03 LAB — LIPASE, BLOOD: Lipase: 20 U/L (ref 11–51)

## 2020-01-03 LAB — COMPREHENSIVE METABOLIC PANEL
ALT: 34 U/L (ref 0–44)
AST: 54 U/L — ABNORMAL HIGH (ref 15–41)
Albumin: 5.2 g/dL — ABNORMAL HIGH (ref 3.5–5.0)
Alkaline Phosphatase: 105 U/L (ref 38–126)
Anion gap: 15 (ref 5–15)
BUN: <5 mg/dL — ABNORMAL LOW (ref 8–23)
CO2: 31 mmol/L (ref 22–32)
Calcium: 9.5 mg/dL (ref 8.9–10.3)
Chloride: 88 mmol/L — ABNORMAL LOW (ref 98–111)
Creatinine, Ser: 0.49 mg/dL — ABNORMAL LOW (ref 0.61–1.24)
GFR calc Af Amer: >60 mL/min (ref >60)
GFR calc non Af Amer: >60 mL/min (ref >60)
Glucose, Bld: 95 mg/dL (ref 70–99)
Potassium: 4.1 mmol/L (ref 3.5–5.1)
Sodium: 134 mmol/L — ABNORMAL LOW (ref 135–145)
Total Bilirubin: 1.9 mg/dL — ABNORMAL HIGH (ref 0.3–1.2)
Total Protein: 8.8 g/dL — ABNORMAL HIGH (ref 6.5–8.1)

## 2020-01-03 LAB — CBC
HCT: 48.4 % (ref 39.0–52.0)
Hemoglobin: 16.8 g/dL (ref 13.0–17.0)
MCH: 31.3 pg (ref 26.0–34.0)
MCHC: 34.7 g/dL (ref 30.0–36.0)
MCV: 90.1 fL (ref 80.0–100.0)
Platelets: 165 10*3/uL (ref 150–400)
RBC: 5.37 MIL/uL (ref 4.22–5.81)
RDW: 12.7 % (ref 11.5–15.5)
WBC: 14.9 10*3/uL — ABNORMAL HIGH (ref 4.0–10.5)
nRBC: 0 % (ref 0.0–0.2)

## 2020-01-03 LAB — URINALYSIS, COMPLETE (UACMP) WITH MICROSCOPIC
Bacteria, UA: NONE SEEN
Bilirubin Urine: NEGATIVE
Glucose, UA: NEGATIVE mg/dL
Hgb urine dipstick: NEGATIVE
Ketones, ur: NEGATIVE mg/dL
Leukocytes,Ua: NEGATIVE
Nitrite: NEGATIVE
Protein, ur: NEGATIVE mg/dL
Specific Gravity, Urine: 1.002 — ABNORMAL LOW (ref 1.005–1.030)
Squamous Epithelial / HPF: NONE SEEN (ref 0–5)
pH: 7 (ref 5.0–8.0)

## 2020-01-03 LAB — ETHANOL: Alcohol, Ethyl (B): 194 mg/dL — ABNORMAL HIGH (ref <10)

## 2020-01-03 MED ORDER — KETOROLAC TROMETHAMINE 30 MG/ML IJ SOLN
15.0000 mg | INTRAMUSCULAR | Status: AC
  Administered 2020-01-03: 15 mg via INTRAVENOUS
  Filled 2020-01-03: qty 1

## 2020-01-03 MED ORDER — PANTOPRAZOLE SODIUM 40 MG IV SOLR
40.0000 mg | Freq: Once | INTRAVENOUS | Status: AC
  Administered 2020-01-03: 40 mg via INTRAVENOUS
  Filled 2020-01-03: qty 40

## 2020-01-03 MED ORDER — ONDANSETRON 4 MG PO TBDP: 4.0000 mg | ORAL_TABLET | Freq: Three times a day (TID) | ORAL | 0 refills | Status: DC | PRN

## 2020-01-03 MED ORDER — FAMOTIDINE 20 MG PO TABS: 20.0000 mg | ORAL_TABLET | Freq: Two times a day (BID) | ORAL | 0 refills | Status: DC

## 2020-01-03 MED ORDER — CHLORDIAZEPOXIDE HCL 25 MG PO CAPS
25.0000 mg | ORAL_CAPSULE | Freq: Once | ORAL | Status: AC
  Administered 2020-01-03: 25 mg via ORAL
  Filled 2020-01-03: qty 1

## 2020-01-03 MED ORDER — SENNOSIDES-DOCUSATE SODIUM 8.6-50 MG PO TABS: 2.0000 | ORAL_TABLET | Freq: Two times a day (BID) | ORAL | 0 refills | Status: DC

## 2020-01-03 MED ORDER — SODIUM CHLORIDE 0.9 % IV BOLUS
1000.0000 mL | Freq: Once | INTRAVENOUS | Status: AC
  Administered 2020-01-03: 1000 mL via INTRAVENOUS
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
## 2020-01-03 NOTE — ED Notes (Signed)
Fecal occult negative for blood

## 2020-01-03 NOTE — ED Notes (Signed)
Ultrasound IV infiltrated, removed
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
## 2020-01-03 NOTE — Discharge Instructions (Signed)
Your xray and lab tests were all okay today. A stool softener may help regulate your bowel movement frequency.  You should seek treatment for alcohol abuse.  Stopping suddenly from a pattern of frequent heavy drinking can cause dangerous withdrawal symptoms, so if you wish to stop drinking,  you should make a plan with a medical profressional.

## 2020-01-03 NOTE — ED Notes (Signed)
Pt produced scant amount of liquid stool. Pt cleaned and changed

## 2020-01-03 NOTE — ED Notes (Signed)
D/c sig pad not working

## 2020-01-17 ENCOUNTER — Inpatient Hospital Stay
Admission: EM | Admit: 2020-01-17 | Discharge: 2020-01-30 | DRG: 896 | Disposition: A | Discharge status: Home Health | Payer: Medicare HMO | Attending: Internal Medicine | Admitting: Internal Medicine

## 2020-01-17 ENCOUNTER — Emergency Department: Payer: Medicare HMO

## 2020-01-17 ENCOUNTER — Other Ambulatory Visit: Payer: Self-pay

## 2020-01-17 ENCOUNTER — Encounter: Payer: Self-pay | Admitting: Emergency Medicine

## 2020-01-17 DIAGNOSIS — D6959 Other secondary thrombocytopenia: Secondary | ICD-10-CM | POA: Diagnosis present

## 2020-01-17 DIAGNOSIS — Z20822 Contact with and (suspected) exposure to covid-19: Secondary | ICD-10-CM | POA: Diagnosis present

## 2020-01-17 DIAGNOSIS — R252 Cramp and spasm: Secondary | ICD-10-CM | POA: Diagnosis present

## 2020-01-17 DIAGNOSIS — N400 Enlarged prostate without lower urinary tract symptoms: Secondary | ICD-10-CM | POA: Diagnosis present

## 2020-01-17 DIAGNOSIS — G9341 Metabolic encephalopathy: Secondary | ICD-10-CM | POA: Diagnosis not present

## 2020-01-17 DIAGNOSIS — Z0189 Encounter for other specified special examinations: Secondary | ICD-10-CM

## 2020-01-17 DIAGNOSIS — K760 Fatty (change of) liver, not elsewhere classified: Secondary | ICD-10-CM | POA: Diagnosis present

## 2020-01-17 DIAGNOSIS — S069X9S Unspecified intracranial injury with loss of consciousness of unspecified duration, sequela: Secondary | ICD-10-CM

## 2020-01-17 DIAGNOSIS — Z801 Family history of malignant neoplasm of trachea, bronchus and lung: Secondary | ICD-10-CM

## 2020-01-17 DIAGNOSIS — F10929 Alcohol use, unspecified with intoxication, unspecified: Secondary | ICD-10-CM | POA: Diagnosis present

## 2020-01-17 DIAGNOSIS — F329 Major depressive disorder, single episode, unspecified: Secondary | ICD-10-CM | POA: Diagnosis present

## 2020-01-17 DIAGNOSIS — E512 Wernicke's encephalopathy: Secondary | ICD-10-CM

## 2020-01-17 DIAGNOSIS — M438X6 Other specified deforming dorsopathies, lumbar region: Secondary | ICD-10-CM | POA: Diagnosis present

## 2020-01-17 DIAGNOSIS — Z79899 Other long term (current) drug therapy: Secondary | ICD-10-CM

## 2020-01-17 DIAGNOSIS — E440 Moderate protein-calorie malnutrition: Secondary | ICD-10-CM | POA: Diagnosis present

## 2020-01-17 DIAGNOSIS — F1029 Alcohol dependence with unspecified alcohol-induced disorder: Secondary | ICD-10-CM | POA: Diagnosis present

## 2020-01-17 DIAGNOSIS — M438X4 Other specified deforming dorsopathies, thoracic region: Secondary | ICD-10-CM | POA: Diagnosis present

## 2020-01-17 DIAGNOSIS — Z0279 Encounter for issue of other medical certificate: Secondary | ICD-10-CM

## 2020-01-17 DIAGNOSIS — F10920 Alcohol use, unspecified with intoxication, uncomplicated: Secondary | ICD-10-CM | POA: Diagnosis not present

## 2020-01-17 DIAGNOSIS — R296 Repeated falls: Secondary | ICD-10-CM | POA: Diagnosis present

## 2020-01-17 DIAGNOSIS — R1310 Dysphagia, unspecified: Secondary | ICD-10-CM | POA: Diagnosis present

## 2020-01-17 DIAGNOSIS — K219 Gastro-esophageal reflux disease without esophagitis: Secondary | ICD-10-CM | POA: Diagnosis present

## 2020-01-17 DIAGNOSIS — K2100 Gastro-esophageal reflux disease with esophagitis, without bleeding: Secondary | ICD-10-CM | POA: Diagnosis present

## 2020-01-17 DIAGNOSIS — F10239 Alcohol dependence with withdrawal, unspecified: Secondary | ICD-10-CM | POA: Diagnosis not present

## 2020-01-17 DIAGNOSIS — K5900 Constipation, unspecified: Secondary | ICD-10-CM | POA: Diagnosis present

## 2020-01-17 DIAGNOSIS — F1090 Alcohol use, unspecified, uncomplicated: Secondary | ICD-10-CM

## 2020-01-17 DIAGNOSIS — R110 Nausea: Secondary | ICD-10-CM | POA: Insufficient documentation

## 2020-01-17 DIAGNOSIS — F10220 Alcohol dependence with intoxication, uncomplicated: Secondary | ICD-10-CM | POA: Diagnosis not present

## 2020-01-17 DIAGNOSIS — K209 Esophagitis, unspecified without bleeding: Secondary | ICD-10-CM | POA: Insufficient documentation

## 2020-01-17 DIAGNOSIS — I10 Essential (primary) hypertension: Secondary | ICD-10-CM | POA: Diagnosis present

## 2020-01-17 DIAGNOSIS — K7030 Alcoholic cirrhosis of liver without ascites: Secondary | ICD-10-CM | POA: Diagnosis present

## 2020-01-17 DIAGNOSIS — Y908 Blood alcohol level of 240 mg/100 ml or more: Secondary | ICD-10-CM | POA: Diagnosis present

## 2020-01-17 DIAGNOSIS — Z6820 Body mass index (BMI) 20.0-20.9, adult: Secondary | ICD-10-CM

## 2020-01-17 DIAGNOSIS — R531 Weakness: Secondary | ICD-10-CM | POA: Diagnosis present

## 2020-01-17 DIAGNOSIS — K6289 Other specified diseases of anus and rectum: Secondary | ICD-10-CM | POA: Diagnosis present

## 2020-01-17 DIAGNOSIS — Z789 Other specified health status: Secondary | ICD-10-CM

## 2020-01-17 DIAGNOSIS — F1010 Alcohol abuse, uncomplicated: Secondary | ICD-10-CM | POA: Diagnosis present

## 2020-01-17 DIAGNOSIS — L89899 Pressure ulcer of other site, unspecified stage: Secondary | ICD-10-CM | POA: Diagnosis present

## 2020-01-17 DIAGNOSIS — F32A Depression, unspecified: Secondary | ICD-10-CM | POA: Diagnosis present

## 2020-01-17 DIAGNOSIS — Z7289 Other problems related to lifestyle: Secondary | ICD-10-CM

## 2020-01-17 DIAGNOSIS — Z8249 Family history of ischemic heart disease and other diseases of the circulatory system: Secondary | ICD-10-CM

## 2020-01-17 DIAGNOSIS — Z87891 Personal history of nicotine dependence: Secondary | ICD-10-CM

## 2020-01-17 DIAGNOSIS — D696 Thrombocytopenia, unspecified: Secondary | ICD-10-CM | POA: Diagnosis present

## 2020-01-17 DIAGNOSIS — E871 Hypo-osmolality and hyponatremia: Secondary | ICD-10-CM | POA: Diagnosis present

## 2020-01-17 DIAGNOSIS — K4090 Unilateral inguinal hernia, without obstruction or gangrene, not specified as recurrent: Secondary | ICD-10-CM | POA: Diagnosis present

## 2020-01-17 HISTORY — DX: Unspecified intracranial injury with loss of consciousness of unspecified duration, initial encounter: S06.9X9A

## 2020-01-17 HISTORY — DX: Unspecified intracranial injury with loss of consciousness status unknown, initial encounter: S06.9XAA

## 2020-01-17 LAB — COMPREHENSIVE METABOLIC PANEL
ALT: 40 U/L (ref 0–44)
AST: 59 U/L — ABNORMAL HIGH (ref 15–41)
Albumin: 4.4 g/dL (ref 3.5–5.0)
Alkaline Phosphatase: 99 U/L (ref 38–126)
Anion gap: 9 (ref 5–15)
BUN: 6 mg/dL — ABNORMAL LOW (ref 8–23)
CO2: 30 mmol/L (ref 22–32)
Calcium: 8.6 mg/dL — ABNORMAL LOW (ref 8.9–10.3)
Chloride: 98 mmol/L (ref 98–111)
Creatinine, Ser: 0.41 mg/dL — ABNORMAL LOW (ref 0.61–1.24)
GFR calc Af Amer: >60 mL/min (ref >60)
GFR calc non Af Amer: >60 mL/min (ref >60)
Glucose, Bld: 91 mg/dL (ref 70–99)
Potassium: 3.7 mmol/L (ref 3.5–5.1)
Sodium: 137 mmol/L (ref 135–145)
Total Bilirubin: 0.9 mg/dL (ref 0.3–1.2)
Total Protein: 7.4 g/dL (ref 6.5–8.1)

## 2020-01-17 LAB — MAGNESIUM: Magnesium: 1.9 mg/dL (ref 1.7–2.4)

## 2020-01-17 LAB — CBC WITH DIFFERENTIAL/PLATELET
Abs Immature Granulocytes: 0.01 10*3/uL (ref 0.00–0.07)
Basophils Absolute: 0.1 10*3/uL (ref 0.0–0.1)
Basophils Relative: 1 %
Eosinophils Absolute: 0 10*3/uL (ref 0.0–0.5)
Eosinophils Relative: 0 %
HCT: 41.1 % (ref 39.0–52.0)
Hemoglobin: 14.4 g/dL (ref 13.0–17.0)
Immature Granulocytes: 0 %
Lymphocytes Relative: 60 %
Lymphs Abs: 4.2 10*3/uL — ABNORMAL HIGH (ref 0.7–4.0)
MCH: 31.6 pg (ref 26.0–34.0)
MCHC: 35 g/dL (ref 30.0–36.0)
MCV: 90.1 fL (ref 80.0–100.0)
Monocytes Absolute: 0.4 10*3/uL (ref 0.1–1.0)
Monocytes Relative: 6 %
Neutro Abs: 2.3 10*3/uL (ref 1.7–7.7)
Neutrophils Relative %: 33 %
Platelets: 92 10*3/uL — ABNORMAL LOW (ref 150–400)
RBC: 4.56 MIL/uL (ref 4.22–5.81)
RDW: 12.5 % (ref 11.5–15.5)
WBC: 7.1 10*3/uL (ref 4.0–10.5)
nRBC: 0 % (ref 0.0–0.2)

## 2020-01-17 LAB — AMMONIA: Ammonia: 22 umol/L (ref 9–35)

## 2020-01-17 LAB — SARS CORONAVIRUS 2 (TAT 6-24 HRS): SARS Coronavirus 2: NEGATIVE

## 2020-01-17 LAB — LIPASE, BLOOD: Lipase: 49 U/L (ref 11–51)

## 2020-01-17 LAB — ETHANOL: Alcohol, Ethyl (B): 307 mg/dL (ref <10)

## 2020-01-17 MED ORDER — ONDANSETRON HCL 4 MG/2ML IJ SOLN
4.0000 mg | Freq: Three times a day (TID) | INTRAMUSCULAR | Status: DC | PRN
  Administered 2020-01-17: 4 mg via INTRAVENOUS
  Filled 2020-01-17: qty 2

## 2020-01-17 MED ORDER — ONDANSETRON 4 MG PO TBDP: ORAL_TABLET | ORAL | 0 refills | Status: DC

## 2020-01-17 MED ORDER — PANTOPRAZOLE SODIUM 40 MG IV SOLR
40.0000 mg | Freq: Two times a day (BID) | INTRAVENOUS | Status: DC
  Administered 2020-01-17 – 2020-01-20 (×7): 40 mg via INTRAVENOUS
  Filled 2020-01-17 (×7): qty 40

## 2020-01-17 MED ORDER — THIAMINE HCL 100 MG/ML IJ SOLN: 100.0000 mg | Freq: Every day | INTRAMUSCULAR | Status: DC

## 2020-01-17 MED ORDER — ATENOLOL 25 MG PO TABS
25.0000 mg | ORAL_TABLET | Freq: Two times a day (BID) | ORAL | Status: DC
  Administered 2020-01-17 – 2020-01-30 (×22): 25 mg via ORAL
  Filled 2020-01-17 (×28): qty 1

## 2020-01-17 MED ORDER — IOHEXOL 300 MG/ML  SOLN
75.0000 mL | Freq: Once | INTRAMUSCULAR | Status: AC | PRN
  Administered 2020-01-17: 75 mL via INTRAVENOUS

## 2020-01-17 MED ORDER — THIAMINE HCL 100 MG PO TABS
100.0000 mg | ORAL_TABLET | Freq: Every day | ORAL | Status: DC
  Filled 2020-01-17: qty 1

## 2020-01-17 MED ORDER — HYDRALAZINE HCL 50 MG PO TABS: 25.0000 mg | ORAL_TABLET | Freq: Three times a day (TID) | ORAL | Status: DC | PRN

## 2020-01-17 MED ORDER — ADULT MULTIVITAMIN W/MINERALS CH
1.0000 | ORAL_TABLET | Freq: Every day | ORAL | Status: DC
  Administered 2020-01-17 – 2020-01-30 (×13): 1 via ORAL
  Filled 2020-01-17 (×14): qty 1

## 2020-01-17 MED ORDER — FOLIC ACID 1 MG PO TABS
1.0000 mg | ORAL_TABLET | Freq: Every day | ORAL | Status: DC
  Administered 2020-01-17 – 2020-01-30 (×12): 1 mg via ORAL
  Filled 2020-01-17 (×13): qty 1

## 2020-01-17 MED ORDER — DROPERIDOL 2.5 MG/ML IJ SOLN
2.5000 mg | Freq: Once | INTRAMUSCULAR | Status: AC
  Administered 2020-01-17: 2.5 mg via INTRAVENOUS
  Filled 2020-01-17: qty 2

## 2020-01-17 MED ORDER — SUCRALFATE 1 G PO TABS: 1.0000 g | ORAL_TABLET | Freq: Four times a day (QID) | ORAL | 1 refills | Status: DC | PRN

## 2020-01-17 MED ORDER — LORAZEPAM 2 MG/ML IJ SOLN
0.0000 mg | Freq: Two times a day (BID) | INTRAMUSCULAR | Status: AC
  Administered 2020-01-20 (×2): 3 mg via INTRAVENOUS
  Filled 2020-01-17 (×2): qty 2

## 2020-01-17 MED ORDER — LORAZEPAM 2 MG/ML IJ SOLN
1.0000 mg | INTRAMUSCULAR | Status: AC | PRN
  Administered 2020-01-18: 4 mg via INTRAVENOUS
  Administered 2020-01-18: 3 mg via INTRAVENOUS
  Administered 2020-01-18 – 2020-01-19 (×2): 2 mg via INTRAVENOUS
  Administered 2020-01-20: 3 mg via INTRAVENOUS
  Administered 2020-01-20: 04:00:00 2 mg via INTRAVENOUS
  Administered 2020-01-20: 3 mg via INTRAVENOUS
  Filled 2020-01-17 (×3): qty 2
  Filled 2020-01-17 (×2): qty 1

## 2020-01-17 MED ORDER — THIAMINE HCL 100 MG/ML IJ SOLN
100.0000 mg | Freq: Once | INTRAMUSCULAR | Status: AC
  Administered 2020-01-17: 100 mg via INTRAVENOUS
  Filled 2020-01-17: qty 2

## 2020-01-17 MED ORDER — OMEPRAZOLE MAGNESIUM 20 MG PO TBEC: 20.0000 mg | DELAYED_RELEASE_TABLET | Freq: Every day | ORAL | 1 refills | Status: DC

## 2020-01-17 MED ORDER — ENSURE ENLIVE PO LIQD
237.0000 mL | Freq: Two times a day (BID) | ORAL | Status: DC
  Administered 2020-01-20 – 2020-01-30 (×15): 237 mL via ORAL

## 2020-01-17 MED ORDER — THIAMINE HCL 100 MG PO TABS: 100.0000 mg | ORAL_TABLET | Freq: Every day | ORAL | Status: DC

## 2020-01-17 MED ORDER — PAROXETINE HCL 30 MG PO TABS
30.0000 mg | ORAL_TABLET | Freq: Every day | ORAL | Status: DC
  Administered 2020-01-17 – 2020-01-30 (×12): 30 mg via ORAL
  Filled 2020-01-17 (×14): qty 1

## 2020-01-17 MED ORDER — SODIUM CHLORIDE 0.9 % IV SOLN: INTRAVENOUS | Status: DC

## 2020-01-17 MED ORDER — LORAZEPAM 1 MG PO TABS
1.0000 mg | ORAL_TABLET | ORAL | Status: AC | PRN
  Administered 2020-01-17 (×4): 1 mg via ORAL
  Filled 2020-01-17 (×4): qty 1

## 2020-01-17 MED ORDER — FOLIC ACID 5 MG/ML IJ SOLN
1.0000 mg | Freq: Every day | INTRAMUSCULAR | Status: DC
  Administered 2020-01-17: 1 mg via INTRAVENOUS
  Filled 2020-01-17: qty 0.2

## 2020-01-17 MED ORDER — ONDANSETRON 4 MG PO TBDP
4.0000 mg | ORAL_TABLET | Freq: Once | ORAL | Status: AC
  Administered 2020-01-17: 4 mg via ORAL
  Filled 2020-01-17: qty 1

## 2020-01-17 MED ORDER — LORAZEPAM 2 MG/ML IJ SOLN
0.0000 mg | Freq: Four times a day (QID) | INTRAMUSCULAR | Status: AC
  Administered 2020-01-17: 2 mg via INTRAVENOUS
  Filled 2020-01-17: qty 2
  Filled 2020-01-17: qty 1
  Filled 2020-01-17 (×2): qty 2

## 2020-01-17 MED ORDER — DIPHENHYDRAMINE HCL 50 MG/ML IJ SOLN
25.0000 mg | INTRAMUSCULAR | Status: AC
  Administered 2020-01-17: 25 mg via INTRAVENOUS
  Filled 2020-01-17: qty 1

## 2020-01-17 NOTE — ED Notes (Signed)
Orders to give Benadryl see MAR
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
## 2020-01-17 NOTE — ED Notes (Signed)
Pt sitting in lobby in w/c with no distress; pt yelling out at frequent intervals "stating he wants to go lay down, he has been here for 3 hours"; pt has been informed of his arrival time and that we are waiting for an exam room for him but cont to yell out st "get me a doctor out here, what are you good for?, pt is currently calling 911 to come pick him up and take him elsewhere and is requesting this nurse to call 911 for him as well because they told him no; again pt informed that as soon as an exam room is ready, he will go back to see the doctor

## 2020-01-17 NOTE — ED Notes (Signed)
Pt given water 

## 2020-01-17 NOTE — ED Provider Notes (Signed)
Procedures    ----------------------------------------- 10:58 AM on 01/17/2020 -----------------------------------------  With patient fully awake, he is found to be alert and oriented to person and place only, disoriented to time.  He is ataxic and unable to maintain balance even sitting upright on the edge of the bed.  He lives alone at home.  I suspect Warn[place]ke encephalopathy due to his alcoholism.  IV thiamine and folic acid ordered.  Case discussed with the hospitalist for further evaluation and management.   Carrie Mew, MD 01/17/20 1059

## 2020-01-17 NOTE — ED Notes (Signed)
[name] presentation has changed in regards to his speech and ability to convey his thoughts. Pt repeating slowly "mouth dry, cant speak". [name] lower mouth seems to be withdrawn in while speaking and pt's motor function has slowed/ muscles seem to be contracting:  MD notified

## 2020-01-17 NOTE — ED Notes (Signed)
Pt woke.  Reports he lives alone and takes care of himself.  Pt requested call sister for ride.  She is concerned about his walking.  Will make sure pt able to walk prior to discharge.  Pt brief changed.
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
## 2020-01-17 NOTE — ED Notes (Signed)
This RN and Merry Proud from [facility] were unable to placed an IV. Pt has had 3 attempts. Right arm is withdrawn due to stroke. Additional staff requested

## 2020-01-17 NOTE — ED Notes (Signed)
Pt taken to CT.

## 2020-01-17 NOTE — ED Notes (Signed)
Pt was changed into a new gown and linens due to incontinence. Pt clean and dry at this time, large measured void

## 2020-01-17 NOTE — ED Notes (Signed)
Pt unable to stand on own at this time.  RN having to help hold pt up when sitting on side of bed.  Disoriented to time.  Right side of body limited use at baseline.

## 2020-01-17 NOTE — ED Notes (Signed)
Called dietary. They report tray had been sent. Informed them that no tray for patient arrived to ED, another will be sent.
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
## 2020-01-18 DIAGNOSIS — K760 Fatty (change of) liver, not elsewhere classified: Secondary | ICD-10-CM | POA: Diagnosis present

## 2020-01-18 DIAGNOSIS — K6289 Other specified diseases of anus and rectum: Secondary | ICD-10-CM | POA: Diagnosis present

## 2020-01-18 DIAGNOSIS — R296 Repeated falls: Secondary | ICD-10-CM | POA: Diagnosis present

## 2020-01-18 DIAGNOSIS — N400 Enlarged prostate without lower urinary tract symptoms: Secondary | ICD-10-CM | POA: Diagnosis present

## 2020-01-18 DIAGNOSIS — S069X9S Unspecified intracranial injury with loss of consciousness of unspecified duration, sequela: Secondary | ICD-10-CM | POA: Diagnosis not present

## 2020-01-18 DIAGNOSIS — F10239 Alcohol dependence with withdrawal, unspecified: Secondary | ICD-10-CM | POA: Diagnosis present

## 2020-01-18 DIAGNOSIS — G9341 Metabolic encephalopathy: Secondary | ICD-10-CM | POA: Diagnosis present

## 2020-01-18 DIAGNOSIS — K2100 Gastro-esophageal reflux disease with esophagitis, without bleeding: Secondary | ICD-10-CM | POA: Diagnosis present

## 2020-01-18 DIAGNOSIS — E871 Hypo-osmolality and hyponatremia: Secondary | ICD-10-CM | POA: Diagnosis present

## 2020-01-18 DIAGNOSIS — Z20822 Contact with and (suspected) exposure to covid-19: Secondary | ICD-10-CM | POA: Diagnosis present

## 2020-01-18 DIAGNOSIS — L89899 Pressure ulcer of other site, unspecified stage: Secondary | ICD-10-CM | POA: Diagnosis present

## 2020-01-18 DIAGNOSIS — E440 Moderate protein-calorie malnutrition: Secondary | ICD-10-CM | POA: Diagnosis present

## 2020-01-18 DIAGNOSIS — K4090 Unilateral inguinal hernia, without obstruction or gangrene, not specified as recurrent: Secondary | ICD-10-CM | POA: Diagnosis present

## 2020-01-18 DIAGNOSIS — F10220 Alcohol dependence with intoxication, uncomplicated: Secondary | ICD-10-CM | POA: Diagnosis present

## 2020-01-18 DIAGNOSIS — R1310 Dysphagia, unspecified: Secondary | ICD-10-CM | POA: Diagnosis present

## 2020-01-18 DIAGNOSIS — R531 Weakness: Secondary | ICD-10-CM | POA: Diagnosis present

## 2020-01-18 DIAGNOSIS — F1029 Alcohol dependence with unspecified alcohol-induced disorder: Secondary | ICD-10-CM | POA: Diagnosis present

## 2020-01-18 DIAGNOSIS — K5900 Constipation, unspecified: Secondary | ICD-10-CM | POA: Diagnosis present

## 2020-01-18 DIAGNOSIS — F329 Major depressive disorder, single episode, unspecified: Secondary | ICD-10-CM | POA: Diagnosis present

## 2020-01-18 DIAGNOSIS — R252 Cramp and spasm: Secondary | ICD-10-CM | POA: Diagnosis present

## 2020-01-18 DIAGNOSIS — M438X4 Other specified deforming dorsopathies, thoracic region: Secondary | ICD-10-CM | POA: Diagnosis present

## 2020-01-18 DIAGNOSIS — Y908 Blood alcohol level of 240 mg/100 ml or more: Secondary | ICD-10-CM | POA: Diagnosis present

## 2020-01-18 DIAGNOSIS — I10 Essential (primary) hypertension: Secondary | ICD-10-CM | POA: Diagnosis present

## 2020-01-18 DIAGNOSIS — K7030 Alcoholic cirrhosis of liver without ascites: Secondary | ICD-10-CM | POA: Diagnosis present

## 2020-01-18 DIAGNOSIS — D6959 Other secondary thrombocytopenia: Secondary | ICD-10-CM | POA: Diagnosis present

## 2020-01-18 DIAGNOSIS — D696 Thrombocytopenia, unspecified: Secondary | ICD-10-CM | POA: Diagnosis present

## 2020-01-18 LAB — BASIC METABOLIC PANEL
Anion gap: 8 (ref 5–15)
BUN: 8 mg/dL (ref 8–23)
CO2: 27 mmol/L (ref 22–32)
Calcium: 8.8 mg/dL — ABNORMAL LOW (ref 8.9–10.3)
Chloride: 104 mmol/L (ref 98–111)
Creatinine, Ser: 0.57 mg/dL — ABNORMAL LOW (ref 0.61–1.24)
GFR calc Af Amer: >60 mL/min (ref >60)
GFR calc non Af Amer: >60 mL/min (ref >60)
Glucose, Bld: 109 mg/dL — ABNORMAL HIGH (ref 70–99)
Potassium: 3.5 mmol/L (ref 3.5–5.1)
Sodium: 139 mmol/L (ref 135–145)

## 2020-01-18 LAB — BLOOD GAS, VENOUS
Acid-Base Excess: 3.2 mmol/L — ABNORMAL HIGH (ref 0.0–2.0)
Bicarbonate: 28.5 mmol/L — ABNORMAL HIGH (ref 20.0–28.0)
O2 Saturation: 99.5 %
Patient temperature: 37
pCO2, Ven: 45 mmHg (ref 44.0–60.0)
pH, Ven: 7.41 (ref 7.250–7.430)
pO2, Ven: 169 mmHg — ABNORMAL HIGH (ref 32.0–45.0)

## 2020-01-18 LAB — PROTIME-INR
INR: 1.2 (ref 0.8–1.2)
Prothrombin Time: 14.7 seconds (ref 11.4–15.2)

## 2020-01-18 LAB — CBC
HCT: 40.1 % (ref 39.0–52.0)
Hemoglobin: 13.9 g/dL (ref 13.0–17.0)
MCH: 31.7 pg (ref 26.0–34.0)
MCHC: 34.7 g/dL (ref 30.0–36.0)
MCV: 91.6 fL (ref 80.0–100.0)
Platelets: 62 10*3/uL — ABNORMAL LOW (ref 150–400)
RBC: 4.38 MIL/uL (ref 4.22–5.81)
RDW: 12.3 % (ref 11.5–15.5)
WBC: 7 10*3/uL (ref 4.0–10.5)
nRBC: 0 % (ref 0.0–0.2)

## 2020-01-18 LAB — BRAIN NATRIURETIC PEPTIDE: B Natriuretic Peptide: 185 pg/mL — ABNORMAL HIGH (ref 0.0–100.0)

## 2020-01-18 LAB — AMMONIA: Ammonia: 29 umol/L (ref 9–35)

## 2020-01-18 LAB — GLUCOSE, CAPILLARY
Glucose-Capillary: 105 mg/dL — ABNORMAL HIGH (ref 70–99)
Glucose-Capillary: 86 mg/dL (ref 70–99)
Glucose-Capillary: 77 mg/dL (ref 70–99)

## 2020-01-18 MED ORDER — THIAMINE HCL 100 MG/ML IJ SOLN
100.0000 mg | Freq: Every day | INTRAMUSCULAR | Status: DC
  Administered 2020-01-19 – 2020-01-20 (×2): 100 mg via INTRAVENOUS
  Filled 2020-01-18 (×2): qty 2

## 2020-01-18 NOTE — Consult Note (Signed)
  Patient too sedated to assess today.  Will attempt to assess tomorrow

## 2020-01-18 NOTE — TOC Initial Note (Signed)
Transition of Care (TOC) - Initial/Assessment Note    Patient Details  Name: Douglas Peterson. MRN: [id number] Date of Birth: July 18, 1951  Transition of Care [facility]) CM/SW Contact:    Shelbie Hutching, RN Phone Number: 01/18/2020, 3:17 PM  Clinical Narrative:                 Patient admitted to the hospital for complications related to alcohol use disorder.  [name] somnolent but will open eyes to voice and occasionally responds to questions appropriately.  [name] from home where he lives alone, patient does have a sister, [name], who checks on him throughout the week.  [name] has been up to see him and expresses frustration with the patient's alcohol use disorder.  Patient's sister does not believe patient can adequately care for himself, he has gone to [facility] in the past for short term rehab.  Psych consult is pending.  RNCM will cont to follow and assist with discharge planning.   Expected Discharge Plan: Skilled Nursing Facility Barriers to Discharge: Continued Medical Work up   Patient Goals and CMS Choice Patient states their goals for this hospitalization and ongoing recovery are:: unable to verbalize goals      Expected Discharge Plan and Services Expected Discharge Plan: [place]   Discharge Planning Services: CM Consult   Living arrangements for the past 2 months: Single Family Home                                      Prior Living Arrangements/Services Living arrangements for the past 2 months: Single Family Home Lives with:: Self Patient language and need for interpreter reviewed:: Yes Do you feel safe going back to the place where you live?: Yes      Need for Family Participation in Patient Care: Yes (Comment)(ETOH abuse) Care giver support system in place?: Yes (comment)(sister)   Criminal Activity/Legal Involvement Pertinent to Current Situation/Hospitalization: No - Comment as needed  Activities of Daily Living Home  Assistive Devices/Equipment: Environmental consultant (specify type), Cane (specify quad or straight) ADL Screening (condition at time of admission) Patient's cognitive ability adequate to safely complete daily activities?: Yes Is the patient deaf or have difficulty hearing?: No Does the patient have difficulty seeing, even when wearing glasses/contacts?: No Does the patient have difficulty concentrating, remembering, or making decisions?: No Patient able to express need for assistance with ADLs?: No Does the patient have difficulty dressing or bathing?: No Independently performs ADLs?: Yes (appropriate for developmental age) Does the patient have difficulty walking or climbing stairs?: No Weakness of Legs: None Weakness of Arms/Hands: None  Permission Sought/Granted Permission sought to share information with : Case Manager, Family Supports Permission granted to share information with : Yes, Verbal Permission Granted        Permission granted to share info w Relationship: sister     Emotional Assessment Appearance:: Appears older than stated age, Disheveled     Orientation: : Oriented to Self Alcohol / Substance Use: Alcohol Use Psych Involvement: Yes (comment)  Admission diagnosis:  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 GERD (gastroesophageal reflux disease) 01/17/2020  . Depression 01/17/2020  . Esophagitis   . Nausea without  vomiting   . Weakness   . Tremor   . Pancytopenia ([place])   . Hypomagnesemia   . Alcoholic cirrhosis of liver without ascites ([place])   . Thrombocytopenia ([place])   . Non-traumatic rhabdomyolysis   . Alcohol withdrawal delirium ([place]) 12/08/2019   . Alcohol abuse   . Alcohol intoxication, uncomplicated ([place])   . 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[place]) 09/27/2019  . Hypokalemia 09/27/2019  . Dysphagia 09/27/2019  . Alcohol withdrawal delirium, acute, hyperactive ([place]) 09/21/2019  . Pressure injury of skin 08/15/2019  . Goals of care, counseling/discussion   . Palliative care by specialist   . Alcohol withdrawal ([place]) 03/20/2019  . Low back pain 11/15/2018  . Acute metabolic encephalopathy 45/01/8881  . Delirium tremens ([place]) 03/08/2018  . Malnutrition of moderate degree 11/24/2017  . HTN (hypertension) 09/16/2017  . Hypothermia 12/17/2016  . Alcohol use   . Diarrhea   . Hyponatremia 07/09/2015   PCP:  [name], MD Pharmacy:   [facility] [address], [place] - [place] [address] [place] [address] Phone: [phone] Fax: [phone]     Social Determinants of Health (SDOH) Interventions    Readmission Risk Interventions Readmission Risk Prevention Plan 12/13/2019 12/12/2019  Transportation Screening Complete Complete  PCP or Specialist Appt within 3-5 Days (No Data) -  HRI or [place] (No Data) -  Palliative Care Screening Complete Complete  Medication Review (RN Care Manager) Complete Complete  Some recent data might be hidden

## 2020-01-18 NOTE — Progress Notes (Signed)
Ch attempted visit in response to Order Requisition for an AD. Pt was asleep at this time. Ch will pass on OR to Ch.Gregory.
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
## 2020-01-18 NOTE — Progress Notes (Signed)
Patient stated to this RN "get me a couple of beers." This RN reoriented the patient by telling the patient that he is in the hospital and could not get any beers. Will continue to monitor.

## 2020-01-19 DIAGNOSIS — Z0189 Encounter for other specified special examinations: Secondary | ICD-10-CM

## 2020-01-19 DIAGNOSIS — Z0279 Encounter for issue of other medical certificate: Secondary | ICD-10-CM

## 2020-01-19 LAB — CBC WITH DIFFERENTIAL/PLATELET
Abs Immature Granulocytes: 0.01 10*3/uL (ref 0.00–0.07)
Basophils Absolute: 0 10*3/uL (ref 0.0–0.1)
Basophils Relative: 1 %
Eosinophils Absolute: 0.1 10*3/uL (ref 0.0–0.5)
Eosinophils Relative: 2 %
HCT: 38.3 % — ABNORMAL LOW (ref 39.0–52.0)
Hemoglobin: 13.6 g/dL (ref 13.0–17.0)
Immature Granulocytes: 0 %
Lymphocytes Relative: 36 %
Lymphs Abs: 1.4 10*3/uL (ref 0.7–4.0)
MCH: 31.9 pg (ref 26.0–34.0)
MCHC: 35.5 g/dL (ref 30.0–36.0)
MCV: 89.7 fL (ref 80.0–100.0)
Monocytes Absolute: 0.3 10*3/uL (ref 0.1–1.0)
Monocytes Relative: 9 %
Neutro Abs: 2.1 10*3/uL (ref 1.7–7.7)
Neutrophils Relative %: 52 %
Platelets: 52 10*3/uL — ABNORMAL LOW (ref 150–400)
RBC: 4.27 MIL/uL (ref 4.22–5.81)
RDW: 12.1 % (ref 11.5–15.5)
WBC: 4 10*3/uL (ref 4.0–10.5)
nRBC: 0 % (ref 0.0–0.2)

## 2020-01-19 LAB — COMPREHENSIVE METABOLIC PANEL
ALT: 28 U/L (ref 0–44)
AST: 39 U/L (ref 15–41)
Albumin: 3.7 g/dL (ref 3.5–5.0)
Alkaline Phosphatase: 84 U/L (ref 38–126)
Anion gap: 13 (ref 5–15)
BUN: 7 mg/dL — ABNORMAL LOW (ref 8–23)
CO2: 21 mmol/L — ABNORMAL LOW (ref 22–32)
Calcium: 8.3 mg/dL — ABNORMAL LOW (ref 8.9–10.3)
Chloride: 102 mmol/L (ref 98–111)
Creatinine, Ser: 0.4 mg/dL — ABNORMAL LOW (ref 0.61–1.24)
GFR calc Af Amer: >60 mL/min (ref >60)
GFR calc non Af Amer: >60 mL/min (ref >60)
Glucose, Bld: 66 mg/dL — ABNORMAL LOW (ref 70–99)
Potassium: 3.3 mmol/L — ABNORMAL LOW (ref 3.5–5.1)
Sodium: 136 mmol/L (ref 135–145)
Total Bilirubin: 1.8 mg/dL — ABNORMAL HIGH (ref 0.3–1.2)
Total Protein: 6.4 g/dL — ABNORMAL LOW (ref 6.5–8.1)

## 2020-01-19 LAB — T4, FREE: Free T4: 0.78 ng/dL (ref 0.61–1.12)

## 2020-01-19 LAB — GLUCOSE, CAPILLARY
Glucose-Capillary: 59 mg/dL — ABNORMAL LOW (ref 70–99)
Glucose-Capillary: 55 mg/dL — ABNORMAL LOW (ref 70–99)
Glucose-Capillary: 66 mg/dL — ABNORMAL LOW (ref 70–99)
Glucose-Capillary: 126 mg/dL — ABNORMAL HIGH (ref 70–99)
Glucose-Capillary: 106 mg/dL — ABNORMAL HIGH (ref 70–99)

## 2020-01-19 LAB — PHOSPHORUS: Phosphorus: 2.9 mg/dL (ref 2.5–4.6)

## 2020-01-19 LAB — MAGNESIUM: Magnesium: 1.6 mg/dL — ABNORMAL LOW (ref 1.7–2.4)

## 2020-01-19 LAB — TSH: TSH: 2.425 u[IU]/mL (ref 0.350–4.500)

## 2020-01-19 MED ORDER — MAGNESIUM SULFATE 2 GM/50ML IV SOLN
2.0000 g | Freq: Once | INTRAVENOUS | Status: AC
  Administered 2020-01-20: 2 g via INTRAVENOUS
  Filled 2020-01-19: qty 50

## 2020-01-19 MED ORDER — MAGNESIUM SULFATE 50 % IJ SOLN: 2.0000 g | Freq: Once | INTRAMUSCULAR | Status: DC

## 2020-01-19 MED ORDER — POTASSIUM PHOSPHATES 15 MMOLE/5ML IV SOLN
30.0000 mmol | Freq: Once | INTRAVENOUS | Status: AC
  Administered 2020-01-20: 30 mmol via INTRAVENOUS
  Filled 2020-01-19: qty 10

## 2020-01-19 NOTE — Progress Notes (Signed)
Ch visited wit Pt in response to Order Requisition for AD. Upon arrival, Pt was alert, watching TV. Pt reported that he was feeling better today; "not good yet, but better". When Ch asked about signing the AD, Pt shared concerns about being discharged and being sent home. Pt wishes to be sent to [facility] " I would [facility] go to [facility], where they have PT, here you just lie down like this." Ch let Pt know that she will check with RN about this concern, so she can update Pt about plans. Pt requested prayer for family and his health. Ch prayed with Pt. Pt said he will look over AD even though he declined the AD initially. Ch left AD packet with Pt. Ch let Pt know about chaplain availability, and left.
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
## 2020-01-19 NOTE — TOC Progression Note (Signed)
Transition of Care (TOC) - Progression Note    Patient Details  Name: Douglas Peterson. MRN: [id number] Date of Birth: 1951-06-19  Transition of Care [facility]) CM/SW Contact  Shelbie Hutching, RN Phone Number: 01/19/2020, 12:55 PM  Clinical Narrative:     [name] much more awake and alert today.  Patient would like to go to [facility] for rehab at discharge.  Bed request has been sent.  PT consult pending.   Expected Discharge Plan: [place] Barriers to Discharge: Continued Medical Work up  Expected Discharge Plan and Services Expected Discharge Plan: [place]   Discharge Planning Services: CM Consult   Living arrangements for the past 2 months: Single Family Home                                       Social Determinants of Health (SDOH) Interventions    Readmission Risk Interventions Readmission Risk Prevention Plan 12/13/2019 12/12/2019  Transportation Screening Complete Complete  PCP or Specialist Appt within 3-5 Days (No Data) -  HRI or [place] (No Data) -  Palliative Care Screening Complete Complete  Medication Review (RN Care Manager) Complete Complete  Some recent data might be hidden

## 2020-01-19 NOTE — Evaluation (Signed)
Physical Therapy Evaluation Patient Details Name: Douglas Peterson. MRN: [id number] DOB: 05-06-1951 Today's Date: 01/19/2020   History of Present Illness  Douglas Peterson is a 39yoM who comes [facility] on 3/16 from home c AMS adn persistent nausea. Alcohol level 307 upon arrival. Pt typically independent with ADL at baseline. Drives, performs IADL independently, although MD has asked pt to stop driving. 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  Clinical Impression  Pt admitted with above diagnosis. Pt currently with functional limitations due to the deficits listed below (see "PT Problem List"). Upon entry, pt in bed, awake and excited to participate. The pt is alert and oriented to self, location, pleasant, conversational, and generally a fair historian. Pt impulsive at times and slightly confused, largely flat affect. Pt requires maxA for bed mobility, minA for taking steps at bedside, unsafe to attempt actually AMB at eval. Balance and strength are both acutely impaired to a significant degree. Functional mobility assessment demonstrates increased effort/time requirements, poor tolerance, and need for physical assistance, whereas the patient performed these at a higher level of independence PTA. Pt will benefit from skilled PT intervention to increase independence and safety with basic mobility in preparation for discharge to the venue listed below.       Follow Up Recommendations SNF;Supervision for mobility/OOB;Supervision/Assistance - 24 hour    Equipment Recommendations  None recommended by PT    Recommendations for Other Services       Precautions / Restrictions Precautions Precautions: Fall Restrictions Weight Bearing Restrictions: No      Mobility  Bed Mobility Overal bed mobility: Needs Assistance Bed Mobility: Supine to Sit;Sit to Supine     Supine to sit: Min guard Sit to supine: Max assist   General bed mobility comments: significant effort  and bradykinesia  Transfers Overall transfer level: Needs assistance Equipment used: 1 person hand held assist Transfers: Sit to/from Stand Sit to Stand: Min assist         General transfer comment: max effort, very weak and unsteady on feet, falls back into sitting seevral times  Ambulation/Gait   Gait Distance (Feet): 4 Feet Assistive device: 1 person hand held assist       General Gait Details: Assisted pt with hand (SPC substitiue) + minA for steps to/from countertop Estate agent [place] (Stroke Patients Only)       Balance                                             Pertinent Vitals/Pain Pain Assessment: No/denies pain    Home Living Family/patient expects to be discharged to:: Private residence Living Arrangements: Alone Available Help at Discharge: Family Type of Home: House Home Access: Stairs to enter Entrance Stairs-Rails: Right;Left;Can reach both Entrance Stairs-Number of Steps: 4 Home Layout: One level Home Equipment: Environmental consultant - 2 wheels;Cane - single point;Tub bench;Grab bars - tub/shower      Prior Function Level of Independence: Independent with assistive device(s)         Comments: Pt report some days he uses cane and somedays he uses walker     Hand Dominance   Dominant Hand: Left    Extremity/Trunk Assessment   Upper Extremity Assessment Upper Extremity Assessment: RUE deficits/detail;LUE deficits/detail RUE Deficits / Details:  chronic flexion contracture s/p brain injury. LUE Deficits / Details: Left intentional tremor, pt denies as baseline.            Communication      Cognition Arousal/Alertness: Awake/alert Behavior During Therapy: WFL for tasks assessed/performed;Impulsive Overall Cognitive Status: Impaired/Different from baseline                                 General Comments: seems a little confused, but largely interactive and  functional.      General Comments      Exercises     Assessment/Plan    PT Assessment Patient needs continued PT services  PT Problem List Decreased strength;Decreased range of motion;Decreased activity tolerance;Decreased balance;Decreased mobility;Decreased knowledge of precautions;Decreased safety awareness       PT Treatment Interventions DME instruction;Balance training;Gait training;Stair training;Functional mobility training;Therapeutic activities;Therapeutic exercise;Patient/family education    PT Goals (Current goals can be found in the Care Plan section)  Acute Rehab PT Goals Patient Stated Goal: regain baseline mobility and independence PT Goal Formulation: With patient Time For Goal Achievement: 02/02/20 Potential to Achieve Goals: Fair    Frequency Min 2X/week   Barriers to discharge Decreased caregiver support;Inaccessible home environment      Co-evaluation               AM-PAC PT "6 Clicks" Mobility  Outcome Measure Help needed turning from your back to your side while in a flat bed without using bedrails?: A Lot Help needed moving from lying on your back to sitting on the side of a flat bed without using bedrails?: A Little Help needed moving to and from a bed to a chair (including a wheelchair)?: A Little Help needed standing up from a chair using your arms (e.g., wheelchair or bedside chair)?: A Little Help needed to walk in hospital room?: A Lot Help needed climbing 3-5 steps with a railing? : A Lot 6 Click Score: 15    End of Session Equipment Utilized During Treatment: Gait belt Activity Tolerance: Patient tolerated treatment well;Treatment limited secondary to medical complications (Comment)(acute tremor, AMS, weakness) Patient left: in bed;with call bell/phone within reach;with bed alarm set Nurse Communication: Mobility status PT Visit Diagnosis: Unsteadiness on feet (R26.81);Difficulty in walking, not elsewhere classified (R26.2);Other  abnormalities of gait and mobility (R26.89)    Time: 0383-3383 PT Time Calculation (min) (ACUTE ONLY): 30 min   Charges:   PT Evaluation $PT Eval Moderate Complexity: 1 Mod PT Treatments $Therapeutic Exercise: 8-22 mins       5:09 PM, 01/19/20 Etta Grandchild, PT, DPT Physical Therapist - [facility]  [phone] ([place])   [place] C 01/19/2020, 5:06 PM

## 2020-01-20 LAB — COMPREHENSIVE METABOLIC PANEL
ALT: 32 U/L (ref 0–44)
AST: 45 U/L — ABNORMAL HIGH (ref 15–41)
Albumin: 4 g/dL (ref 3.5–5.0)
Alkaline Phosphatase: 94 U/L (ref 38–126)
Anion gap: 11 (ref 5–15)
BUN: 5 mg/dL — ABNORMAL LOW (ref 8–23)
CO2: 25 mmol/L (ref 22–32)
Calcium: 8.6 mg/dL — ABNORMAL LOW (ref 8.9–10.3)
Chloride: 99 mmol/L (ref 98–111)
Creatinine, Ser: 0.46 mg/dL — ABNORMAL LOW (ref 0.61–1.24)
GFR calc Af Amer: >60 mL/min (ref >60)
GFR calc non Af Amer: >60 mL/min (ref >60)
Glucose, Bld: 115 mg/dL — ABNORMAL HIGH (ref 70–99)
Potassium: 3.5 mmol/L (ref 3.5–5.1)
Sodium: 135 mmol/L (ref 135–145)
Total Bilirubin: 1.5 mg/dL — ABNORMAL HIGH (ref 0.3–1.2)
Total Protein: 7 g/dL (ref 6.5–8.1)

## 2020-01-20 LAB — GLUCOSE, CAPILLARY
Glucose-Capillary: 96 mg/dL (ref 70–99)
Glucose-Capillary: 103 mg/dL — ABNORMAL HIGH (ref 70–99)
Glucose-Capillary: 95 mg/dL (ref 70–99)
Glucose-Capillary: 98 mg/dL (ref 70–99)

## 2020-01-20 LAB — CBC WITH DIFFERENTIAL/PLATELET
Abs Immature Granulocytes: 0.02 10*3/uL (ref 0.00–0.07)
Basophils Absolute: 0 10*3/uL (ref 0.0–0.1)
Basophils Relative: 1 %
Eosinophils Absolute: 0.1 10*3/uL (ref 0.0–0.5)
Eosinophils Relative: 2 %
HCT: 39.2 % (ref 39.0–52.0)
Hemoglobin: 14.1 g/dL (ref 13.0–17.0)
Immature Granulocytes: 1 %
Lymphocytes Relative: 34 %
Lymphs Abs: 1.5 10*3/uL (ref 0.7–4.0)
MCH: 31.6 pg (ref 26.0–34.0)
MCHC: 36 g/dL (ref 30.0–36.0)
MCV: 87.9 fL (ref 80.0–100.0)
Monocytes Absolute: 0.4 10*3/uL (ref 0.1–1.0)
Monocytes Relative: 10 %
Neutro Abs: 2.4 10*3/uL (ref 1.7–7.7)
Neutrophils Relative %: 52 %
Platelets: 75 10*3/uL — ABNORMAL LOW (ref 150–400)
RBC: 4.46 MIL/uL (ref 4.22–5.81)
RDW: 12.3 % (ref 11.5–15.5)
WBC: 4.4 10*3/uL (ref 4.0–10.5)
nRBC: 0 % (ref 0.0–0.2)

## 2020-01-20 MED ORDER — PANTOPRAZOLE SODIUM 40 MG PO TBEC
40.0000 mg | DELAYED_RELEASE_TABLET | Freq: Two times a day (BID) | ORAL | Status: DC
  Administered 2020-01-20 – 2020-01-30 (×20): 40 mg via ORAL
  Filled 2020-01-20 (×20): qty 1

## 2020-01-20 MED ORDER — LORAZEPAM 1 MG PO TABS: 1.0000 mg | ORAL_TABLET | ORAL | Status: DC | PRN

## 2020-01-20 MED ORDER — LORAZEPAM 2 MG/ML IJ SOLN
1.0000 mg | INTRAMUSCULAR | Status: DC | PRN
  Administered 2020-01-20 – 2020-01-21 (×2): 2 mg via INTRAVENOUS
  Filled 2020-01-20 (×2): qty 1

## 2020-01-20 MED ORDER — THIAMINE HCL 100 MG PO TABS
100.0000 mg | ORAL_TABLET | Freq: Every day | ORAL | Status: DC
  Administered 2020-01-21 – 2020-01-30 (×10): 100 mg via ORAL
  Filled 2020-01-20 (×10): qty 1

## 2020-01-20 NOTE — Plan of Care (Signed)
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
## 2020-01-20 NOTE — Care Management Important Message (Signed)
Important Message  Patient Details  Name: Douglas Peterson. MRN: [id number] Date of Birth: 02-15-1951   Medicare Important Message Given:  N/A - LOS <3 / Initial given by admissions     Juliann Pulse A  01/20/2020, 7:32 AM

## 2020-01-20 NOTE — Progress Notes (Signed)
PHARMACIST - PHYSICIAN COMMUNICATION  CONCERNING: IV to Oral Route Change Policy  RECOMMENDATION: This patient is receiving Thiamine and Protonix  by the intravenous route.  Based on criteria approved by the Pharmacy and Therapeutics Committee, the intravenous medication(s) is/are being converted to the equivalent oral dose form(s).   DESCRIPTION: These criteria include:  The patient is eating (either orally or via tube) and/or has been taking other orally administered medications for a least 24 hours  The patient has no evidence of active gastrointestinal bleeding or impaired GI absorption (gastrectomy, short bowel, patient on TNA or NPO).   Pernell Dupre, PharmD, BCPS Clinical Pharmacist 01/20/2020 11:45 AM

## 2020-01-21 LAB — COMPREHENSIVE METABOLIC PANEL
ALT: 29 U/L (ref 0–44)
AST: 35 U/L (ref 15–41)
Albumin: 3.7 g/dL (ref 3.5–5.0)
Alkaline Phosphatase: 80 U/L (ref 38–126)
Anion gap: 10 (ref 5–15)
BUN: 7 mg/dL — ABNORMAL LOW (ref 8–23)
CO2: 26 mmol/L (ref 22–32)
Calcium: 8.9 mg/dL (ref 8.9–10.3)
Chloride: 102 mmol/L (ref 98–111)
Creatinine, Ser: 0.53 mg/dL — ABNORMAL LOW (ref 0.61–1.24)
GFR calc Af Amer: >60 mL/min (ref >60)
GFR calc non Af Amer: >60 mL/min (ref >60)
Glucose, Bld: 80 mg/dL (ref 70–99)
Potassium: 3.2 mmol/L — ABNORMAL LOW (ref 3.5–5.1)
Sodium: 138 mmol/L (ref 135–145)
Total Bilirubin: 1.4 mg/dL — ABNORMAL HIGH (ref 0.3–1.2)
Total Protein: 6.8 g/dL (ref 6.5–8.1)

## 2020-01-21 LAB — CBC WITH DIFFERENTIAL/PLATELET
Abs Immature Granulocytes: 0.01 10*3/uL (ref 0.00–0.07)
Basophils Absolute: 0 10*3/uL (ref 0.0–0.1)
Basophils Relative: 1 %
Eosinophils Absolute: 0.1 10*3/uL (ref 0.0–0.5)
Eosinophils Relative: 2 %
HCT: 40.8 % (ref 39.0–52.0)
Hemoglobin: 14.5 g/dL (ref 13.0–17.0)
Immature Granulocytes: 0 %
Lymphocytes Relative: 41 %
Lymphs Abs: 1.9 10*3/uL (ref 0.7–4.0)
MCH: 31.7 pg (ref 26.0–34.0)
MCHC: 35.5 g/dL (ref 30.0–36.0)
MCV: 89.3 fL (ref 80.0–100.0)
Monocytes Absolute: 0.7 10*3/uL (ref 0.1–1.0)
Monocytes Relative: 15 %
Neutro Abs: 1.9 10*3/uL (ref 1.7–7.7)
Neutrophils Relative %: 41 %
Platelets: 111 10*3/uL — ABNORMAL LOW (ref 150–400)
RBC: 4.57 MIL/uL (ref 4.22–5.81)
RDW: 12.8 % (ref 11.5–15.5)
WBC: 4.8 10*3/uL (ref 4.0–10.5)
nRBC: 0 % (ref 0.0–0.2)

## 2020-01-21 MED ORDER — POTASSIUM CHLORIDE 10 MEQ/100ML IV SOLN
10.0000 meq | INTRAVENOUS | Status: AC
  Administered 2020-01-21 (×2): 10 meq via INTRAVENOUS
  Filled 2020-01-21 (×2): qty 100

## 2020-01-21 NOTE — TOC Progression Note (Signed)
Transition of Care (TOC) - Progression Note    Patient Details  Name: Douglas Peterson. MRN: [id number] Date of Birth: [birth year]  Transition of Care [facility]) CM/SW Contact  Boris Sharper, LCSW Phone Number:[phone] 01/21/2020, 9:46 AM  Clinical Narrative: CSW called Otila Kluver at [facility] to check on bed offer, Otila Kluver said they are unable to offer a bed at this time. CSW notified pt's sister and she gave permission to send to other facilities in the area. CSW faxed referral out to surrounding facilities.   TOC will continue to follow for  discharge planning needs  Expected Discharge Plan: [place] Barriers to Discharge: Continued Medical Work up  Expected Discharge Plan and Services Expected Discharge Plan: [place]   Discharge Planning Services: CM Consult   Living arrangements for the past 2 months: Single Family Home                                       Social Determinants of Health (SDOH) Interventions    Readmission Risk Interventions Readmission Risk Prevention Plan 12/13/2019 12/12/2019  Transportation Screening Complete Complete  PCP or Specialist Appt within 3-5 Days (No Data) -  HRI or [place] (No Data) -  Palliative Care Screening Complete Complete  Medication Review (RN Care Manager) Complete Complete  Some recent data might be hidden
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
## 2020-01-22 LAB — CBC WITH DIFFERENTIAL/PLATELET
Abs Immature Granulocytes: 0.01 10*3/uL (ref 0.00–0.07)
Basophils Absolute: 0 10*3/uL (ref 0.0–0.1)
Basophils Relative: 1 %
Eosinophils Absolute: 0.1 10*3/uL (ref 0.0–0.5)
Eosinophils Relative: 2 %
HCT: 42.3 % (ref 39.0–52.0)
Hemoglobin: 14.6 g/dL (ref 13.0–17.0)
Immature Granulocytes: 0 %
Lymphocytes Relative: 46 %
Lymphs Abs: 2.3 10*3/uL (ref 0.7–4.0)
MCH: 31.4 pg (ref 26.0–34.0)
MCHC: 34.5 g/dL (ref 30.0–36.0)
MCV: 91 fL (ref 80.0–100.0)
Monocytes Absolute: 0.8 10*3/uL (ref 0.1–1.0)
Monocytes Relative: 17 %
Neutro Abs: 1.7 10*3/uL (ref 1.7–7.7)
Neutrophils Relative %: 34 %
Platelets: 141 10*3/uL — ABNORMAL LOW (ref 150–400)
RBC: 4.65 MIL/uL (ref 4.22–5.81)
RDW: 13 % (ref 11.5–15.5)
WBC: 4.9 10*3/uL (ref 4.0–10.5)
nRBC: 0 % (ref 0.0–0.2)

## 2020-01-22 LAB — COMPREHENSIVE METABOLIC PANEL
ALT: 29 U/L (ref 0–44)
AST: 34 U/L (ref 15–41)
Albumin: 3.8 g/dL (ref 3.5–5.0)
Alkaline Phosphatase: 81 U/L (ref 38–126)
Anion gap: 7 (ref 5–15)
BUN: 11 mg/dL (ref 8–23)
CO2: 25 mmol/L (ref 22–32)
Calcium: 8.9 mg/dL (ref 8.9–10.3)
Chloride: 104 mmol/L (ref 98–111)
Creatinine, Ser: 0.43 mg/dL — ABNORMAL LOW (ref 0.61–1.24)
GFR calc Af Amer: >60 mL/min (ref >60)
GFR calc non Af Amer: >60 mL/min (ref >60)
Glucose, Bld: 101 mg/dL — ABNORMAL HIGH (ref 70–99)
Potassium: 3.4 mmol/L — ABNORMAL LOW (ref 3.5–5.1)
Sodium: 136 mmol/L (ref 135–145)
Total Bilirubin: 1.2 mg/dL (ref 0.3–1.2)
Total Protein: 7 g/dL (ref 6.5–8.1)

## 2020-01-22 LAB — MAGNESIUM: Magnesium: 2.1 mg/dL (ref 1.7–2.4)

## 2020-01-22 LAB — PHOSPHORUS: Phosphorus: 3.4 mg/dL (ref 2.5–4.6)

## 2020-01-22 LAB — GLUCOSE, CAPILLARY: Glucose-Capillary: 83 mg/dL (ref 70–99)

## 2020-01-22 MED ORDER — LORAZEPAM 0.5 MG PO TABS
0.5000 mg | ORAL_TABLET | Freq: Three times a day (TID) | ORAL | Status: AC
  Administered 2020-01-22 – 2020-01-23 (×3): 0.5 mg via ORAL
  Filled 2020-01-22 (×3): qty 1
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
## 2020-01-22 NOTE — Progress Notes (Signed)
Physical Therapy Treatment Patient Details Name: Douglas Peterson. MRN: [id number] DOB: 1951-08-09 Today's Date: 01/22/2020    History of Present Illness Douglas Peterson is a 44yoM who comes [facility] on 3/16 from home c AMS adn persistent nausea. Alcohol level 307 upon arrival. Pt typically independent with ADL at baseline. Drives, performs IADL independently, although MD has asked pt to stop driving. 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    PT Comments    Pt is very functionally limited at this time, unable to take meaningful steps or even maintain standing on multiple static standing attempts with cane - needing max assist much of the time and constant assist just to remain upright.  PT familiar with pt from past admissions, typically after a few days (of detox) he is able to walk into the hallway reasonably well, much different this admission.  Pt agrees that he will likely need STR at this point.    Follow Up Recommendations  SNF;Supervision for mobility/OOB;Supervision/Assistance - 24 hour     Equipment Recommendations       Recommendations for Other Services       Precautions / Restrictions Precautions Precautions: Fall Restrictions Weight Bearing Restrictions: No    Mobility  Bed Mobility Overal bed mobility: Needs Assistance Bed Mobility: Supine to Sit;Sit to Supine     Supine to sit: Min guard     General bed mobility comments: Pt was able to get to head   Transfers Overall transfer level: Needs assistance Equipment used: Straight cane Transfers: Sit to/from Stand Sit to Stand: Mod assist         General transfer comment: Pt struggled with keeping weight forward and onto cane, constant mod assist from PT to maintain standing  Ambulation/Gait Ambulation/Gait assistance: Max assist Gait Distance (Feet): 3 Feet Assistive device: Straight cane       General Gait Details: Pt struggled to even shuffle feet even with heavy assist  from PT.  Difficulty using cane appropriately.   Stairs             Wheelchair Mobility    Modified Rankin (Stroke Patients Only)       Balance Overall balance assessment: Needs assistance Sitting-balance support: Single extremity supported Sitting balance-Leahy Scale: Good     Standing balance support: Single extremity supported Standing balance-Leahy Scale: Poor Standing balance comment: Pt leaning backward, unable to maintain standing w/o direct assist                            Cognition Arousal/Alertness: Awake/alert Behavior During Therapy: WFL for tasks assessed/performed;Impulsive Overall Cognitive Status: Impaired/Different from baseline                                        Exercises      General Comments        Pertinent Vitals/Pain Pain Assessment: No/denies pain    Home Living                      Prior Function            PT Goals (current goals can now be found in the care plan section) Progress towards PT goals: Not progressing toward goals - comment(Pt less stable with standing/gait this date)    Frequency    Min 2X/week  PT Plan Current plan remains appropriate    Co-evaluation              AM-PAC PT "6 Clicks" Mobility   Outcome Measure  Help needed turning from your back to your side while in a flat bed without using bedrails?: A Little Help needed moving from lying on your back to sitting on the side of a flat bed without using bedrails?: A Little Help needed moving to and from a bed to a chair (including a wheelchair)?: A Little Help needed standing up from a chair using your arms (e.g., wheelchair or bedside chair)?: A Lot Help needed to walk in hospital room?: Total Help needed climbing 3-5 steps with a railing? : Total 6 Click Score: 13    End of Session Equipment Utilized During Treatment: Gait belt Activity Tolerance: Patient tolerated treatment well;Treatment  limited secondary to medical complications (Comment) Patient left: in bed;with call bell/phone within reach;with bed alarm set Nurse Communication: Mobility status PT Visit Diagnosis: Unsteadiness on feet (R26.81);Difficulty in walking, not elsewhere classified (R26.2);Other abnormalities of gait and mobility (R26.89)     Time: 8875-7972 PT Time Calculation (min) (ACUTE ONLY): 29 min  Charges:  $Therapeutic Activity: 23-37 mins                     Kreg Shropshire, DPT 01/22/2020, 10:53 AM
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
## 2020-01-23 LAB — CBC WITH DIFFERENTIAL/PLATELET
Abs Immature Granulocytes: 0.02 10*3/uL (ref 0.00–0.07)
Basophils Absolute: 0.1 10*3/uL (ref 0.0–0.1)
Basophils Relative: 1 %
Eosinophils Absolute: 0.1 10*3/uL (ref 0.0–0.5)
Eosinophils Relative: 2 %
HCT: 43.5 % (ref 39.0–52.0)
Hemoglobin: 14.8 g/dL (ref 13.0–17.0)
Immature Granulocytes: 0 %
Lymphocytes Relative: 40 %
Lymphs Abs: 2.6 10*3/uL (ref 0.7–4.0)
MCH: 31.2 pg (ref 26.0–34.0)
MCHC: 34 g/dL (ref 30.0–36.0)
MCV: 91.8 fL (ref 80.0–100.0)
Monocytes Absolute: 1 10*3/uL (ref 0.1–1.0)
Monocytes Relative: 15 %
Neutro Abs: 2.6 10*3/uL (ref 1.7–7.7)
Neutrophils Relative %: 42 %
Platelets: 161 10*3/uL (ref 150–400)
RBC: 4.74 MIL/uL (ref 4.22–5.81)
RDW: 13.2 % (ref 11.5–15.5)
WBC: 6.3 10*3/uL (ref 4.0–10.5)
nRBC: 0 % (ref 0.0–0.2)

## 2020-01-23 LAB — COMPREHENSIVE METABOLIC PANEL
ALT: 31 U/L (ref 0–44)
AST: 30 U/L (ref 15–41)
Albumin: 3.8 g/dL (ref 3.5–5.0)
Alkaline Phosphatase: 86 U/L (ref 38–126)
Anion gap: 8 (ref 5–15)
BUN: 18 mg/dL (ref 8–23)
CO2: 26 mmol/L (ref 22–32)
Calcium: 9.2 mg/dL (ref 8.9–10.3)
Chloride: 107 mmol/L (ref 98–111)
Creatinine, Ser: 0.56 mg/dL — ABNORMAL LOW (ref 0.61–1.24)
GFR calc Af Amer: >60 mL/min (ref >60)
GFR calc non Af Amer: >60 mL/min (ref >60)
Glucose, Bld: 102 mg/dL — ABNORMAL HIGH (ref 70–99)
Potassium: 3.9 mmol/L (ref 3.5–5.1)
Sodium: 141 mmol/L (ref 135–145)
Total Bilirubin: 0.9 mg/dL (ref 0.3–1.2)
Total Protein: 7 g/dL (ref 6.5–8.1)

## 2020-01-23 LAB — GLUCOSE, CAPILLARY: Glucose-Capillary: 104 mg/dL — ABNORMAL HIGH (ref 70–99)

## 2020-01-23 NOTE — TOC Progression Note (Signed)
Transition of Care (TOC) - Progression Note    Patient Details  Name: Wake Conlee. MRN: [id number] Date of Birth: 11-08-1950  Transition of Care [facility]) CM/SW Contact  Shelbie Hutching, RN Phone Number: 01/23/2020, 2:18 PM  Clinical Narrative:    This RNCM confirmed with [facility] that they will not be able to accept the patient.  Bed search extended.     Expected Discharge Plan: [place] Barriers to Discharge: Continued Medical Work up  Expected Discharge Plan and Services Expected Discharge Plan: [place]   Discharge Planning Services: CM Consult   Living arrangements for the past 2 months: Single Family Home                                       Social Determinants of Health (SDOH) Interventions    Readmission Risk Interventions Readmission Risk Prevention Plan 12/13/2019 12/12/2019  Transportation Screening Complete Complete  PCP or Specialist Appt within 3-5 Days (No Data) -  HRI or [place] (No Data) -  Palliative Care Screening Complete Complete  Medication Review (RN Care Manager) Complete Complete  Some recent data might be hidden

## 2020-01-23 NOTE — TOC Progression Note (Signed)
Transition of Care (TOC) - Progression Note    Patient Details  Name: Douglas Peterson. MRN: [id number] Date of Birth: 1951/10/09  Transition of Care [facility]) CM/SW Contact  Shelbie Hutching, RN Phone Number: 01/23/2020, 5:57 PM  Clinical Narrative:    [facility] has offered a bed but they notified RNCM that patient has only one more Medicare day before he goes into his copay days.  Patient would be required to pay the first 20 days copay up front which is $3680.  Patient states that he does not want to pay that much and he will just go home.  RNCM spoke with patient's sister who says that if patient does choose to go home she will have nothing to do with it.  [name] unable to walk and has no way to get home it would be an unsafe discharge.  RNCM will follow up with patient tomorrow and try to establish a safe discharge plan.    Expected Discharge Plan: [place] Barriers to Discharge: Continued Medical Work up  Expected Discharge Plan and Services Expected Discharge Plan: [place]   Discharge Planning Services: CM Consult   Living arrangements for the past 2 months: Single Family Home                                       Social Determinants of Health (SDOH) Interventions    Readmission Risk Interventions Readmission Risk Prevention Plan 12/13/2019 12/12/2019  Transportation Screening Complete Complete  PCP or Specialist Appt within 3-5 Days (No Data) -  HRI or [place] (No Data) -  Palliative Care Screening Complete Complete  Medication Review (RN Care Manager) Complete Complete  Some recent data might be hidden

## 2020-01-23 NOTE — Progress Notes (Signed)
Physical Therapy Treatment Patient Details Name: Douglas Peterson. MRN: [id number] DOB: 1951-05-17 Today's Date: 01/23/2020    History of Present Illness Khayri Kargbo is a 64yoM who comes to [facility] on 3/16 from home c AMS and persistent nausea. Alcohol level 307 upon arrival. Pt typically independent with ADL at baseline. Drives, performs IADL independently, although MD has asked pt to stop driving. 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    PT Comments    Pt in bed upon entry, appears more calm and collected, but still with his baseline weakness, flat affect, and segmented speech patterns. Pt motivated to work with PT to improve his independence. Pt still mildly impulsive, but showing more restraint when cued compared to 3DA. Pt AMB 4 bouts, progressively farther, final interval (also longest) is 34f, requires about 2-3 minutes of walking. PT struggles with balance throughout, but it does improve with time. Pt struggles with weight shifting, hip flexion, and toe clearance. He said it feels like his has magnets in his shoes and he is stuck to the floor. Several LOB in session, exclusively retropulsive, aPryor Curiamust correct to avoid fall to floor. Chair follow utilized for AMB in hall.     Follow Up Recommendations  SNF;Supervision for mobility/OOB;Supervision/Assistance - 24 hour     Equipment Recommendations  None recommended by PT    Recommendations for Other Services       Precautions / Restrictions Precautions Precautions: Fall Restrictions Weight Bearing Restrictions: No    Mobility  Bed Mobility Overal bed mobility: Needs Assistance Bed Mobility: Supine to Sit     Supine to sit: Min guard        Transfers Overall transfer level: Needs assistance Equipment used: Straight cane Transfers: Sit to/from Stand Sit to Stand: Min guard;Min assist         General transfer comment: initially takes 45sec to establish balance, but this improves by  ~10sec with each attempts (4-5 total)  Ambulation/Gait Ambulation/Gait assistance: Max assist;Min assist Gait Distance (Feet): 30 Feet Assistive device: Straight cane       General Gait Details: LUE SPC, struggles to establish balance; walks 4 bouts: [id number] a sitting rest between each effort; several LOB in AMB with delayed awareness and zero ability to correct.   Stairs             Wheelchair Mobility    Modified Rankin (Stroke Patients Only)       Balance Overall balance assessment: Needs assistance Sitting-balance support: Single extremity supported Sitting balance-Leahy Scale: Good     Standing balance support: Single extremity supported;During functional activity Standing balance-Leahy Scale: Poor Standing balance comment: Pt leaning backward, unable to maintain standing w/o direct assist                            Cognition Arousal/Alertness: Awake/alert Behavior During Therapy: WFL for tasks assessed/performed;Impulsive Overall Cognitive Status: Within Functional Limits for tasks assessed                                        Exercises      General Comments        Pertinent Vitals/Pain Pain Assessment: No/denies pain    Home Living  Prior Function            PT Goals (current goals can now be found in the care plan section) Acute Rehab PT Goals Patient Stated Goal: regain baseline mobility and independence PT Goal Formulation: With patient Time For Goal Achievement: 02/02/20 Potential to Achieve Goals: Fair Progress towards PT goals: Progressing toward goals    Frequency    Min 2X/week      PT Plan Current plan remains appropriate    Co-evaluation              AM-PAC PT "6 Clicks" Mobility   Outcome Measure  Help needed turning from your back to your side while in a flat bed without using bedrails?: A Little Help needed moving from lying on  your back to sitting on the side of a flat bed without using bedrails?: A Little Help needed moving to and from a bed to a chair (including a wheelchair)?: A Little Help needed standing up from a chair using your arms (e.g., wheelchair or bedside chair)?: A Lot Help needed to walk in hospital room?: A Lot Help needed climbing 3-5 steps with a railing? : Total 6 Click Score: 14    End of Session Equipment Utilized During Treatment: Gait belt Activity Tolerance: Patient tolerated treatment well;Patient limited by fatigue Patient left: with call bell/phone within reach;in chair;with chair alarm set Nurse Communication: Mobility status PT Visit Diagnosis: Unsteadiness on feet (R26.81);Difficulty in walking, not elsewhere classified (R26.2);Other abnormalities of gait and mobility (R26.89)     Time: 9937-1696 PT Time Calculation (min) (ACUTE ONLY): 30 min  Charges:  $Gait Training: 23-37 mins                     1:17 PM, 01/23/20 Etta Grandchild, PT, DPT Physical Therapist - [facility]  [phone] ([place])    , C 01/23/2020, 1:12 PM
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
## 2020-01-23 NOTE — Care Management Important Message (Signed)
Important Message  Patient Details  Name: Douglas Peterson. MRN: [id number] Date of Birth: [birth year]   Medicare Important Message Given:  Yes     Shelbie Ammons, RN 01/23/2020, 10:46 AM

## 2020-01-24 LAB — CBC WITH DIFFERENTIAL/PLATELET
Abs Immature Granulocytes: 0.01 10*3/uL (ref 0.00–0.07)
Basophils Absolute: 0.1 10*3/uL (ref 0.0–0.1)
Basophils Relative: 1 %
Eosinophils Absolute: 0.1 10*3/uL (ref 0.0–0.5)
Eosinophils Relative: 1 %
HCT: 40.8 % (ref 39.0–52.0)
Hemoglobin: 14.1 g/dL (ref 13.0–17.0)
Immature Granulocytes: 0 %
Lymphocytes Relative: 46 %
Lymphs Abs: 2.6 10*3/uL (ref 0.7–4.0)
MCH: 31.8 pg (ref 26.0–34.0)
MCHC: 34.6 g/dL (ref 30.0–36.0)
MCV: 91.9 fL (ref 80.0–100.0)
Monocytes Absolute: 0.7 10*3/uL (ref 0.1–1.0)
Monocytes Relative: 12 %
Neutro Abs: 2.2 10*3/uL (ref 1.7–7.7)
Neutrophils Relative %: 40 %
Platelets: 180 10*3/uL (ref 150–400)
RBC: 4.44 MIL/uL (ref 4.22–5.81)
RDW: 13.2 % (ref 11.5–15.5)
WBC: 5.6 10*3/uL (ref 4.0–10.5)
nRBC: 0 % (ref 0.0–0.2)

## 2020-01-24 LAB — GLUCOSE, CAPILLARY: Glucose-Capillary: 106 mg/dL — ABNORMAL HIGH (ref 70–99)

## 2020-01-24 NOTE — Discharge Summary (Deleted)
Physician Discharge Summary  Douglas Peterson. SJG:[id number] DOB: January 30, 1951 DOA: 01/17/2020  PCP: [name], MD  Admit date: 01/17/2020 Discharge date: 01/24/2020  Discharge Diagnoses:  Principal Problem:   Acute metabolic encephalopathy Active Problems:   Alcohol use   HTN (hypertension)   Malnutrition of moderate degree   Alcohol intoxication, uncomplicated (HCC)   Alcohol abuse   Alcoholic cirrhosis of liver without ascites (HCC)   Thrombocytopenia (HCC)   GERD (gastroesophageal reflux disease)   Depression   Encounter for competency evaluation   Discharge Condition:  Stable   Diet recommendation:  Regular  Filed Weights   01/17/20 1400  Weight: 53 kg    History of present illness:   Douglas Peterson. is a 69 y.o. 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  Per his sister (I called her sister by phone), pt having persistent severe nausea and dry heaves since yesterday.  He also has had some epigastric abdominal pain.  No diarrhea.  Patient continues to drink alcohol, did not eat well for more than a week.  He has some mild loose stool bowel movement sometimes.  No chest pain, shortness breath.  He has mild dry cough.  No fever or chills.  No symptoms of UTI. Per EDP, pt initially confused at arrival. His mental status has improved to the ED. When I saw pt in ED, he is drowsy, but he is orientated x3.  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    ED Course: pt was found to have alcohol level 307, pending COVID-19 PCR, WBC 7.1, lipase 49, electrolytes renal function okay, temperature normal, blood pressure 169/104, tachycardia, oxygen saturation 93 to 95% on room air.  Chest x-ray negative for infiltration, there showed a mild right-sided  hemidiaphragm elevation.  CT head is negative for acute intracranial abnormalities.  [name] placed on MedSurg bed of observation.  # CT-abd/pelvis showed: 1. Fat and bowel containing left inguinal hernia containing a portion of the proximal sigmoid colon without evidence of mechanical obstruction or vascular compromise. 2. Mild circumferential distal esophageal thickening without adjacent free fluid or gas. This finding is nonspecific but can be seen in the setting of esophagitis. 3. Slight asymmetric thickening of the rectum more pronounced from the 3:00 to 6:00 positions. Finding could reflect a mild proctitis though should be correlated with direct inspection on an outpatient basis. 4. Hepatic steatosis. 5. Mild urinary tract dilatation without obstructing calculi. Indentation of the bladder base by a more line enlarged prostate could reflect some outlet obstruction. Correlate with symptoms. 6. Suspect a subacute or likely chronic compression deformity of T12 with approximately 10% height loss anteriorly though could correlate for point tenderness to assess for acuity. Grossly stable appearance of an L1 compression deformity. Post vertebroplasty changes of L2. 7. Aortic Atherosclerosis (ICD10-I70.0).   Hospital Course:  Pt has been admitted for alcohol intoxication and has improved mentation today and is stable for discharge. PT has evaluated patient but pt declined their recommendation and wants to go home for insurance reasons. Case manager has d/w patient and family. During course of stay pt had epigastric pain that has resolved and ct scan showed esophagitis and has been on Protonix therapy. Of Note pt also has mild urinary tract dilatation and may need urology evaluation as outpatient. [name] clinical condition mental status and diet  has improved.   Procedures:  None  Consultations:  PT/OT/CM  Discharge Exam: Vitals:   01/23/20 2350 01/24/20 0736  BP: 131/79 114/78  Pulse: 61  63  Resp: 17 18  Temp: 97.9 F (36.6 C) (!) 97.5 F (36.4 C)  SpO2: 98% 97%   Discharge Instructions   Discharge Instructions    Call MD for:  difficulty breathing, headache or visual disturbances   Complete by: As directed    Call MD for:  extreme fatigue   Complete by: As directed    Call MD for:  hives   Complete by: As directed    Call MD for:  persistant dizziness or light-headedness   Complete by: As directed    Call MD for:  persistant nausea and vomiting   Complete by: As directed    Call MD for:  severe uncontrolled pain   Complete by: As directed    Call MD for:  temperature >100.4   Complete by: As directed    Diet - low sodium heart healthy   Complete by: As directed    Discharge instructions   Complete by: As directed    Please keep all follow-up appointments with your primary care physician and make an appointment within  1-week to 10 days for hospital follow-up appointment.   Increase activity slowly   Complete by: As directed      Allergies as of 01/24/2020   No Known Allergies     Medication List    TAKE these medications   acetaminophen 325 MG tablet Commonly known as: TYLENOL Take 2 tablets (650 mg total) by mouth every 6 (six) hours as needed for mild pain (or Fever >/= 101).   atenolol 25 MG tablet Commonly known as: TENORMIN Take 25 mg by mouth 2 (two) times daily.   famotidine 20 MG tablet Commonly known as: PEPCID Take 1 tablet (20 mg total) by mouth 2 (two) times daily.   feeding supplement (ENSURE ENLIVE) Liqd Take 237 mLs by mouth 2 (two) times daily between meals.   folic acid 1 MG tablet Commonly known as: FOLVITE Take 1 tablet (1 mg total) by mouth daily.   multivitamin with minerals Tabs tablet Take 1 tablet by mouth daily.   omeprazole 20 MG tablet Commonly known as: PriLOSEC OTC Take 1 tablet (20 mg total) by mouth daily.   ondansetron 4 MG disintegrating tablet Commonly known as: Zofran ODT Allow 1-2 tablets to  dissolve in your mouth every 8 hours as needed for nausea/vomiting What changed:   how much to take  how to take this  when to take this  reasons to take this  additional instructions   PARoxetine 30 MG tablet Commonly known as: PAXIL Take 30 mg by mouth daily.   senna-docusate 8.6-50 MG tablet Commonly known as: Senokot-S Take 2 tablets by mouth 2 (two) times daily.   sucralfate 1 g tablet Commonly known as: Carafate Take 1 tablet (1 g total) by mouth 4 (four) times daily as needed (for abdominal discomfort, nausea, and/or vomiting).   thiamine 100 MG tablet Take 1 tablet (100 mg total) by mouth daily.            Durable Medical Equipment  (From admission, onward)         Start     Ordered   01/24/20 1156  DME Shower stool  Once     01/24/20 1156         No Known Allergies Follow-up Information  [name], MD. Daphane Shepherd on 01/27/2020.   Specialty: Internal Medicine Why: @3 :30 PM  Contact information: [place] [address] [phone]        Lin Landsman, MD. Go on 03/20/2020.   Specialty: Gastroenterology Why: @1 :45 PM  Contact information: [place] [address] [phone]        Fredirick Maudlin, MD. Go on 01/31/2020.   Specialty: General Surgery Why: @2 :30 PM   Contact information: [address] [place] [address] [phone]            The results of significant diagnostics from this hospitalization (including imaging, microbiology, ancillary and laboratory) are listed below for reference.    Significant Diagnostic Studies: DG Chest 2 View  Result Date: 01/17/2020 CLINICAL DATA:  Weakness EXAM: CHEST - 2 VIEW COMPARISON:  12/08/2019 FINDINGS: Mild elevation of the right hemidiaphragm. Low lung volumes. Right base atelectasis. Heart is normal size. Left lung clear. No effusions or acute bony abnormality. IMPRESSION: Low lung volumes. Stable mild  elevation of the right hemidiaphragm with right base atelectasis. Electronically Signed   By: Rolm Baptise M.D.   On: 01/17/2020 09:53   DG Abdomen 1 View  Result Date: 01/03/2020 CLINICAL DATA:  Abdominal pain. Alcohol abuse. Constipation. EXAM: ABDOMEN - 1 VIEW COMPARISON:  06/12/2019 abdominal radiograph FINDINGS: Large amount of colonic gas throughout the large bowel. No disproportionately dilated small bowel loops. No evidence of pneumatosis or pneumoperitoneum. Clear lung bases. No radiopaque nephrolithiasis. Vertebroplasty material again noted within upper lumbar vertebral body. Surgical sutures in the right lower quadrant. IMPRESSION: Large amount of colonic gas.  Nonobstructive bowel gas pattern. Electronically Signed   By: Ilona Sorrel M.D.   On: 01/03/2020 12:34   CT Head Wo Contrast  Result Date: 01/17/2020 CLINICAL DATA:  Neuro deficit, subacute. EXAM: CT HEAD WITHOUT CONTRAST TECHNIQUE: Contiguous axial images were obtained from the base of the skull through the vertex without intravenous contrast. COMPARISON:  Head CT examinations 12/06/2019 and earlier FINDINGS: Brain: There is no evidence of acute intracranial hemorrhage, intracranial mass, midline shift or extra-axial fluid collection.No demarcated cortical infarction. Stable, mild generalized parenchymal atrophy and chronic small vessel ischemic disease. Vascular: No hyperdense vessel. Skull: Normal. Negative for fracture or focal lesion. Sinuses/Orbits: Visualized orbits demonstrate no acute abnormality. No significant paranasal sinus disease or mastoid effusion at the imaged levels. IMPRESSION: No evidence of acute intracranial abnormality. Stable, mild generalized parenchymal atrophy and chronic small vessel ischemic disease. Electronically Signed   By: Kellie Simmering DO   On: 01/17/2020 09:55   CT ABDOMEN PELVIS W CONTRAST  Result Date: 01/17/2020 CLINICAL DATA:  Nausea for 2-3 days, recent ETOH EXAM: CT ABDOMEN AND PELVIS WITH  CONTRAST TECHNIQUE: Multidetector CT imaging of the abdomen and pelvis was performed using the standard protocol following bolus administration of intravenous contrast. CONTRAST:  60m OMNIPAQUE IOHEXOL 300 MG/ML  SOLN COMPARISON:  Right upper quadrant ultrasound 08/14/2019 acute abdominal series 06/12/2019, abdominal radiograph 01/03/2020 FINDINGS: Lower chest: Atelectatic changes present in the lung bases, right slightly greater than left. Normal heart size. No pericardial effusion. Hepatobiliary: Diffuse hepatic hypoattenuation compatible with hepatic steatosis. Geographic regions of sparing are noted along the falciform ligament and gallbladder fossa. No focal worrisome liver lesions. Smooth hepatic surface contour. The gallbladder appears moderately distended. No visible calcified gallstones within the gallbladder lumen or biliary tree. No biliary ductal dilatation. Pancreas: Unremarkable. No pancreatic ductal dilatation or surrounding inflammatory changes. Spleen: Normal  in size without focal abnormality. Adrenals/Urinary Tract: Normal adrenal glands. Bilateral extrarenal pelves with slight pelviectasis and ureterectasis to the level of the urinary bladder without obstructing calculus. No visible or contour deforming renal lesions. Kidneys enhance and excrete symmetrically. Urinary bladder is unremarkable aside from indentation of the bladder base by the enlarged prostate. Stomach/Bowel: Mild circumferential distal esophageal thickening without adjacent free fluid or gas. Stomach is unremarkable. Duodenal sweep is normal with in expected course across the midline abdomen. Proximal colon is unremarkable. Portion of the proximal sigmoid colon protrudes into a left fat and bowel containing inguinal hernia. No resulting evidence of mechanical obstruction or vascular compromise of the herniated bowel loop. Distal sigmoid has a more normal appearance. There is some slightly asymmetric thickening of the rectum more  pronounced from the 3:00 to 6:00 positions. Vascular/Lymphatic: Atherosclerotic plaque within the normal caliber aorta. Major venous structures normally opacified. Hepatic and portal veins appear patent. No suspicious or enlarged lymph nodes in the included lymphatic chains. Reproductive: Mild prostatomegaly. Other: Fat and bowel containing left inguinal hernia containing portion of the proximal sigmoid colon without evidence of mechanical obstruction or vascular compromise. No other bowel containing hernia is seen. Mild bilateral gynecomastia. Musculoskeletal: Multilevel discogenic and facet degenerative changes in the spine including a marked anterior wedge compression deformity of L1 with approximately 70% height loss anteriorly similar to comparison. Prior vertebroplasty changes at the L2 vertebral body noted as well. Superior endplate Schmorl's node and likely subacute to chronic compression deformity at the T12 superior endplate as well. Certainly more pronounced on comparison radiographs from 2020. No other acute or suspicious osseous lesion. IMPRESSION: 1. Fat and bowel containing left inguinal hernia containing a portion of the proximal sigmoid colon without evidence of mechanical obstruction or vascular compromise. 2. Mild circumferential distal esophageal thickening without adjacent free fluid or gas. This finding is nonspecific but can be seen in the setting of esophagitis. 3. Slight asymmetric thickening of the rectum more pronounced from the 3:00 to 6:00 positions. Finding could reflect a mild proctitis though should be correlated with direct inspection on an outpatient basis. 4. Hepatic steatosis. 5. Mild urinary tract dilatation without obstructing calculi. Indentation of the bladder base by a more line enlarged prostate could reflect some outlet obstruction. Correlate with symptoms. 6. Suspect a subacute or likely chronic compression deformity of T12 with approximately 10% height loss anteriorly  though could correlate for point tenderness to assess for acuity. Grossly stable appearance of an L1 compression deformity. Post vertebroplasty changes of L2. 7. Aortic Atherosclerosis (ICD10-I70.0). Electronically Signed   By: Lovena Le M.D.   On: 01/17/2020 05:45    Microbiology: Recent Results (from the past 240 hour(s))  SARS CORONAVIRUS 2 (TAT 6-24 HRS) Nasopharyngeal Nasopharyngeal Swab     Status: None   Collection Time: 01/17/20 10:17 AM   Specimen: Nasopharyngeal Swab  Result Value Ref Range Status   SARS Coronavirus 2 NEGATIVE NEGATIVE Final    Comment: (NOTE) SARS-CoV-2 target nucleic acids are NOT DETECTED. The SARS-CoV-2 RNA is generally detectable in upper and lower respiratory specimens during the acute phase of infection. Negative results do not preclude SARS-CoV-2 infection, do not rule out co-infections with other pathogens, and should not be used as the sole basis for treatment or other patient management decisions. Negative results must be combined with clinical observations, patient history, and epidemiological information. The expected result is Negative. Fact Sheet for Patients: SugarRoll.be Fact Sheet for Healthcare Providers: https://www.woods-mathews.com/ This test is not yet approved or  cleared by the [place] and  has been authorized for detection and/or diagnosis of SARS-CoV-2 by FDA under an Emergency Use Authorization (EUA). This EUA will remain  in effect (meaning this test can be used) for the duration of the COVID-19 declaration under Section 56 4(b)(1) of the Act, 21 U.S.C. section 360bbb-3(b)(1), unless the authorization is terminated or revoked sooner. Performed at [facility], [place] [address] [place], [place] [address]      Labs: Basic Metabolic Panel: Recent Labs  Lab 01/19/20 0945 01/20/20 0535 01/21/20 0634 01/22/20 0537 01/23/20 0510  NA 136 135 138 136 141  K 3.3* 3.5  3.2* 3.4* 3.9  CL 102 99 102 104 107  CO2 21* 25 26 25 26   GLUCOSE 66* 115* 80 101* 102*  BUN 7* 5* 7* 11 18  CREATININE 0.40* 0.46* 0.53* 0.43* 0.56*  CALCIUM 8.3* 8.6* 8.9 8.9 9.2  MG 1.6*  --   --  2.1  --   PHOS 2.9  --   --  3.4  --    Liver Function Tests: Recent Labs  Lab 01/19/20 0945 01/20/20 0535 01/21/20 0634 01/22/20 0537 01/23/20 0510  AST 39 45* 35 34 30  ALT 28 32 29 29 31   ALKPHOS 84 94 80 81 86  BILITOT 1.8* 1.5* 1.4* 1.2 0.9  PROT 6.4* 7.0 6.8 7.0 7.0  ALBUMIN 3.7 4.0 3.7 3.8 3.8   No results for input(s): LIPASE, AMYLASE in the last 168 hours. Recent Labs  Lab 01/17/20 1523 01/18/20 1056  AMMONIA 22 29   CBC: Recent Labs  Lab 01/20/20 0535 01/21/20 0634 01/22/20 0537 01/23/20 0510 01/24/20 0422  WBC 4.4 4.8 4.9 6.3 5.6  NEUTROABS 2.4 1.9 1.7 2.6 2.2  HGB 14.1 14.5 14.6 14.8 14.1  HCT 39.2 40.8 42.3 43.5 40.8  MCV 87.9 89.3 91.0 91.8 91.9  PLT 75* 111* 141* 161 180   Cardiac Enzymes: No results for input(s): CKTOTAL, CKMB, CKMBINDEX, TROPONINI in the last 168 hours. BNP: BNP (last 3 results) Recent Labs    01/18/20 1056  BNP 185.0*    ProBNP (last 3 results) No results for input(s): PROBNP in the last 8760 hours.  CBG: Recent Labs  Lab 01/20/20 1151 01/20/20 1731 01/22/20 0800 01/23/20 0801 01/24/20 0735  GLUCAP 95 98 83 104* 106*   Signed:  Para Skeans MD.  [facility] 01/24/2020, 12:35 PM
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
## 2020-01-24 NOTE — TOC Progression Note (Signed)
Transition of Care (TOC) - Progression Note    Patient Details  Name: Douglas Peterson. MRN: [id number] Date of Birth: February 06, 1951  Transition of Care [facility]) CM/SW Contact  Shelbie Hutching, RN Phone Number: 01/24/2020, 3:11 PM  Clinical Narrative:     Patient did not want to go home today and did not want to make a decision about going for rehab.  RNCM insisted that patient must decide now or we have no choice but to discharge him home.  Patient does not want to go home and agrees to skilled nursing rehab at [facility].  Patient agrees to pay the copay of $3680, [name] patient's sister is going to his home and getting his credit card so he can make the payment.  [name] at [facility] is starting [employer].      Expected Discharge Plan: Skilled Nursing Facility Barriers to Discharge: Continued Medical Work up  Expected Discharge Plan and Services Expected Discharge Plan: [place]   Discharge Planning Services: CM Consult   Living arrangements for the past 2 months: Single Family Home Expected Discharge Date: 01/24/20                                     Social Determinants of Health (SDOH) Interventions    Readmission Risk Interventions Readmission Risk Prevention Plan 12/13/2019 12/12/2019  Transportation Screening Complete Complete  PCP or Specialist Appt within 3-5 Days (No Data) -  HRI or [place] (No Data) -  Palliative Care Screening Complete Complete  Medication Review (RN Care Manager) Complete Complete  Some recent data might be hidden

## 2020-01-25 LAB — CBC WITH DIFFERENTIAL/PLATELET
Abs Immature Granulocytes: 0.01 10*3/uL (ref 0.00–0.07)
Basophils Absolute: 0.1 10*3/uL (ref 0.0–0.1)
Basophils Relative: 1 %
Eosinophils Absolute: 0.1 10*3/uL (ref 0.0–0.5)
Eosinophils Relative: 3 %
HCT: 43.4 % (ref 39.0–52.0)
Hemoglobin: 14.4 g/dL (ref 13.0–17.0)
Immature Granulocytes: 0 %
Lymphocytes Relative: 52 %
Lymphs Abs: 2.5 10*3/uL (ref 0.7–4.0)
MCH: 31.3 pg (ref 26.0–34.0)
MCHC: 33.2 g/dL (ref 30.0–36.0)
MCV: 94.3 fL (ref 80.0–100.0)
Monocytes Absolute: 0.7 10*3/uL (ref 0.1–1.0)
Monocytes Relative: 14 %
Neutro Abs: 1.5 10*3/uL — ABNORMAL LOW (ref 1.7–7.7)
Neutrophils Relative %: 30 %
Platelets: 190 10*3/uL (ref 150–400)
RBC: 4.6 MIL/uL (ref 4.22–5.81)
RDW: 13.3 % (ref 11.5–15.5)
WBC: 4.8 10*3/uL (ref 4.0–10.5)
nRBC: 0 % (ref 0.0–0.2)

## 2020-01-25 LAB — GLUCOSE, CAPILLARY: Glucose-Capillary: 103 mg/dL — ABNORMAL HIGH (ref 70–99)

## 2020-01-25 NOTE — Progress Notes (Signed)
Physical Therapy Treatment Patient Details Name: Douglas Peterson. MRN: [id number] DOB: 12/05/1950 Today's Date: 01/25/2020    History of Present Illness Douglas Peterson is a 14yoM who comes to [facility] on 3/16 from home c AMS and persistent nausea. Alcohol level 307 upon arrival. Pt typically independent with ADL at baseline. Drives, performs IADL independently, although MD has asked pt to stop driving. 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    PT Comments    Pt ready and motivated for session.  To EOB with min guard. X 1 and rail. Stood to [facility] with min a x 1.  He is able to walk 2 laps in room, seated rest then progress gait in hallway x 2 laps.  Pt generally requires min a x 1 due to forward lean and decreased step height and length.  As fatigue increased so does forward lean, and progressively decreased gait quality.  Pt relies on [facility] for support but did not use AD prior to admission.  At times of LOB, he requires mod a x 1 and verbal cues to prevent falls which would have happened if not for skilled intervention.  He admits to fatigue upon return to room.  Pt remains highly motivated to participate in sessions and return home but SNF remains most appropriate discharge disposition.     Follow Up Recommendations  SNF;Supervision for mobility/OOB;Supervision/Assistance - 24 hour     Equipment Recommendations  None recommended by PT    Recommendations for Other Services       Precautions / Restrictions Precautions Precautions: Fall Restrictions Weight Bearing Restrictions: No    Mobility  Bed Mobility Overal bed mobility: Needs Assistance Bed Mobility: Supine to Sit     Supine to sit: Min guard     General bed mobility comments: remained in recliner after session  Transfers Overall transfer level: Needs assistance Equipment used: Straight cane Transfers: Sit to/from Stand Sit to Stand: Min guard;Min assist             Ambulation/Gait Ambulation/Gait assistance: Min assist;Mod assist Gait Distance (Feet): 350 Feet Assistive device: Straight cane Gait Pattern/deviations: Step-through pattern;Decreased step length - right;Decreased step length - left;Narrow base of support Gait velocity: decreased   General Gait Details: remains very unsteady with gait with forward lean with occasional LOB which increase in frequency with fatige as step height and length decrease.  Several instances where pt would have fallen if skilled intervention was not given.   Stairs             Wheelchair Mobility    Modified Rankin (Stroke Patients Only)       Balance Overall balance assessment: Needs assistance Sitting-balance support: Single extremity supported Sitting balance-Leahy Scale: Good     Standing balance support: Single extremity supported;During functional activity Standing balance-Leahy Scale: Poor Standing balance comment: Pt leaning backward, unable to maintain standing w/o direct assist                            Cognition Arousal/Alertness: Awake/alert Behavior During Therapy: WFL for tasks assessed/performed;Impulsive Overall Cognitive Status: Within Functional Limits for tasks assessed                                        Exercises      General Comments        Pertinent  Vitals/Pain Pain Assessment: No/denies pain    Home Living                      Prior Function            PT Goals (current goals can now be found in the care plan section) Progress towards PT goals: Progressing toward goals    Frequency    Min 2X/week      PT Plan Current plan remains appropriate    Co-evaluation              AM-PAC PT "6 Clicks" Mobility   Outcome Measure  Help needed turning from your back to your side while in a flat bed without using bedrails?: A Little Help needed moving from lying on your back to sitting on the side of a  flat bed without using bedrails?: A Little Help needed moving to and from a bed to a chair (including a wheelchair)?: A Little Help needed standing up from a chair using your arms (e.g., wheelchair or bedside chair)?: A Little Help needed to walk in hospital room?: A Lot Help needed climbing 3-5 steps with a railing? : Total 6 Click Score: 15    End of Session Equipment Utilized During Treatment: Gait belt Activity Tolerance: Patient tolerated treatment well Patient left: in chair;with call bell/phone within reach;with chair alarm set Nurse Communication: Mobility status       Time: 4142-3953 PT Time Calculation (min) (ACUTE ONLY): 24 min  Charges:  $Gait Training: 23-37 mins                    Chesley Noon, PTA 01/25/20, 2:59 PM

## 2020-01-25 NOTE — Progress Notes (Signed)
[facility] Progress Note  Patient: Douglas Peterson.    FFM:[id number]  DOA: 01/17/2020     Date of Service: the patient was seen and examined on 01/25/2020  Chief Complaint  Patient presents with  . 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  Presented with complaints of confusion and nausea.  Found to have acute metabolic encephalopathy likely from alcohol intoxication with moderate malnutrition.  Treated conservatively. Currently further plan is arranged for SNF discharge.  Medically stable for transfer.  Assessment and Plan: Acute metabolic encephalopathy:  CT head is negative.  -Placed on MedSurg bed for observation -Frequent neuro check -Give vitamin B1 -CIWA protocol -pt tolerating diet. D/d include dementia/ encephalopathy/ iatrogenic  Alcohol use  -Alcohol intoxication, uncomplicated: alcohol level 307. -CIWA protocol with ativan sister hesitant to use librium due to intolerance in past.   HTN:  -Continue home medications: Atenolol   Malnutrition of moderate degree: BMI 20.06 - pt is tolerating diet but we will do speech eval for aspiration prevention.  Ensure once pt is alert.   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  Platelets 161 and improving..  No bleeding -f/u by CBC  GERD (gastroesophageal reflux disease): -IV Protonix  Depression Stable, no suicidal or homicidal ideations. -Continue home medications: Paxil   Pressure Injury 01/17/20 Groin Anterior;Proximal;Right;Left (Active)  01/17/20 1500  Location: Groin  Location Orientation: Anterior;Proximal;Right;Left  Staging:   Wound Description (Comments):   Present on Admission:      Pressure Injury 01/17/20 (Active)  01/17/20 1500  Location:    Location Orientation:   Staging:   Wound Description (Comments):   Present on Admission:      Diet: Regular diet DVT Prophylaxis: Subcutaneous Heparin    Advance goals of care discussion: Full code  Family Communication: no family was present at bedside, at the time of interview.   Disposition:  Pt is from home, admitted with generalized fatigue and confusion, still has severe fatigue unable to live on his own requiring SNF for therapy, which precludes a safe discharge. Discharge to SNF, when insurance authorization is available.  Subjective: Denies any acute complaint.  No nausea no vomiting.  No fever no chills.  Continues to report fatigue and tiredness.  Dizziness and lightheadedness.  Oral intake improving.  Physical Exam: General:  alert oriented to time, place, and person.  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 Gait not checked due to patient safety concerns  Vitals:   01/24/20 2104 01/25/20 0031 01/25/20 0748 01/25/20 1617  BP: 121/73 127/69 119/76 113/71  Pulse: 60 (!) 58 (!) 57 60  Resp: 18 18 18 18   Temp: 98.7 F (37.1 C) 97.9 F (36.6 C) 97.8 F (36.6 C) 97.8 F (36.6 C)  TempSrc: Oral Oral Oral Oral  SpO2: 98% 96% 97% 97%  Weight:      Height:        Intake/Output Summary (Last 24 hours) at 01/25/2020 1924  Last data filed at 01/25/2020 0947 Gross per 24 hour  Intake 360 ml  Output 250 ml  Net 110 ml   Filed Weights   01/17/20 1400  Weight: 53 kg    Data Reviewed: I have personally reviewed and interpreted daily labs, tele  strips, imagings as discussed above. I reviewed all nursing notes, pharmacy notes, vitals, pertinent old records I have discussed plan of care as described above with RN and patient/family.  CBC: Recent Labs  Lab 01/21/20 0634 01/22/20 0537 01/23/20 0510 01/24/20 0422 01/25/20 0507  WBC 4.8 4.9 6.3 5.6 4.8  NEUTROABS 1.9 1.7 2.6 2.2 1.5*  HGB 14.5 14.6 14.8 14.1 14.4  HCT 40.8 42.3 43.5 40.8 43.4  MCV 89.3 91.0 91.8 91.9 94.3  PLT 111* 141* 161 180 550   Basic Metabolic Panel: Recent Labs  Lab 01/19/20 0945 01/20/20 0535 01/21/20 0634 01/22/20 0537 01/23/20 0510  NA 136 135 138 136 141  K 3.3* 3.5 3.2* 3.4* 3.9  CL 102 99 102 104 107  CO2 21* 25 26 25 26   GLUCOSE 66* 115* 80 101* 102*  BUN 7* 5* 7* 11 18  CREATININE 0.40* 0.46* 0.53* 0.43* 0.56*  CALCIUM 8.3* 8.6* 8.9 8.9 9.2  MG 1.6*  --   --  2.1  --   PHOS 2.9  --   --  3.4  --     Studies: No results found.  Scheduled Meds: . atenolol  25 mg Oral BID  . feeding supplement (ENSURE ENLIVE)  237 mL Oral BID BM  . folic acid  1 mg Oral Daily  . multivitamin with minerals  1 tablet Oral Daily  . pantoprazole  40 mg Oral BID  . PARoxetine  30 mg Oral Daily  . thiamine  100 mg Oral Daily   Continuous Infusions: PRN Meds: ondansetron (ZOFRAN) IV  Time spent: 35 minutes  Author: Berle Mull, MD [facility] 01/25/2020 7:24 PM  To reach On-call, see care teams to locate the attending and reach out to them via [email or web address]. If 7PM-7AM, please contact night-coverage If you still have difficulty reaching the attending provider, please page the [facility] (Director on Call) for [facility] on amion for assistance.

## 2020-01-26 LAB — CBC WITH DIFFERENTIAL/PLATELET
Abs Immature Granulocytes: 0.01 10*3/uL (ref 0.00–0.07)
Basophils Absolute: 0.1 10*3/uL (ref 0.0–0.1)
Basophils Relative: 1 %
Eosinophils Absolute: 0.1 10*3/uL (ref 0.0–0.5)
Eosinophils Relative: 2 %
HCT: 44.5 % (ref 39.0–52.0)
Hemoglobin: 14.9 g/dL (ref 13.0–17.0)
Immature Granulocytes: 0 %
Lymphocytes Relative: 47 %
Lymphs Abs: 2.6 10*3/uL (ref 0.7–4.0)
MCH: 31.6 pg (ref 26.0–34.0)
MCHC: 33.5 g/dL (ref 30.0–36.0)
MCV: 94.3 fL (ref 80.0–100.0)
Monocytes Absolute: 0.7 10*3/uL (ref 0.1–1.0)
Monocytes Relative: 13 %
Neutro Abs: 2.1 10*3/uL (ref 1.7–7.7)
Neutrophils Relative %: 37 %
Platelets: 221 10*3/uL (ref 150–400)
RBC: 4.72 MIL/uL (ref 4.22–5.81)
RDW: 13.3 % (ref 11.5–15.5)
WBC: 5.6 10*3/uL (ref 4.0–10.5)
nRBC: 0 % (ref 0.0–0.2)

## 2020-01-26 LAB — GLUCOSE, CAPILLARY: Glucose-Capillary: 87 mg/dL (ref 70–99)

## 2020-01-26 NOTE — Care Management Important Message (Signed)
Important Message  Patient Details  Name: Douglas Peterson. MRN: [id number] Date of Birth: 28-Dec-1950   Medicare Important Message Given:  Yes     Juliann Pulse A  01/26/2020, 11:51 AM

## 2020-01-26 NOTE — Progress Notes (Signed)
[facility] Progress Note  Patient: Douglas Peterson.    FYB:[id number]  DOA: 01/17/2020     Date of Service: the patient was seen and examined on 01/26/2020  Chief Complaint  Patient presents with  . 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  Presented with complaints of confusion and nausea.  Found to have acute metabolic encephalopathy likely from alcohol intoxication with moderate malnutrition.  Treated conservatively. Currently further plan is arranged for SNF discharge.  Medically stable for transfer.  Assessment and Plan: Acute metabolic encephalopathy:  CT head is negative.  -Give vitamin B1 -CIWA protocol -pt tolerating diet.  Alcohol use  -Alcohol intoxication, uncomplicated: alcohol level 307. -CIWA protocol with ativan sister hesitant to use librium due to intolerance in past.   HTN:  -Continue home medications: Atenolol  Malnutrition of moderate degree: BMI 20.06 - pt is tolerating diet but we will do speech eval for aspiration prevention.  Ensure once pt is alert.   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  Platelets 161 and improving.  No bleeding -f/u by CBC  GERD (gastroesophageal reflux disease): -Protonix  Depression Stable, no suicidal or homicidal ideations. -Continue home medications: Paxil  Pressure Injury 01/17/20 Groin Anterior;Proximal;Right;Left (Active)  01/17/20 1500  Location: Groin  Location Orientation: Anterior;Proximal;Right;Left  Staging:   Wound Description (Comments):   Present on Admission:      Pressure Injury 01/17/20 (Active)  01/17/20 1500  Location:   Location Orientation:   Staging:   Wound Description (Comments):   Present on Admission:    Diet: Regular diet DVT Prophylaxis:  Subcutaneous Heparin    Advance goals of care discussion: Full code  Family Communication: no family was present at bedside, at the time of interview.   Disposition:  Pt is from home, admitted with generalized fatigue and confusion, still has severe fatigue unable to live on his own requiring SNF for therapy, which precludes a safe discharge. Discharge to SNF, when insurance authorization is available.  Subjective: No acute complaint no nausea no vomiting.  Medically stable.  Physical Exam: General:  alert oriented to time, place, and person.  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 I reviewed all nursing notes, pharmacy notes, vitals, pertinent old records I have  discussed plan of care as described above with RN and patient/family.  CBC: Recent Labs  Lab 01/22/20 0537 01/23/20 0510 01/24/20 0422 01/25/20 0507 01/26/20 0437  WBC 4.9 6.3 5.6 4.8 5.6  NEUTROABS 1.7 2.6 2.2 1.5* 2.1  HGB 14.6 14.8 14.1 14.4  14.9  HCT 42.3 43.5 40.8 43.4 44.5  MCV 91.0 91.8 91.9 94.3 94.3  PLT 141* 161 180 190 528   Basic Metabolic Panel: Recent Labs  Lab 01/20/20 0535 01/21/20 0634 01/22/20 0537 01/23/20 0510  NA 135 138 136 141  K 3.5 3.2* 3.4* 3.9  CL 99 102 104 107  CO2 25 26 25 26   GLUCOSE 115* 80 101* 102*  BUN 5* 7* 11 18  CREATININE 0.46* 0.53* 0.43* 0.56*  CALCIUM 8.6* 8.9 8.9 9.2  MG  --   --  2.1  --   PHOS  --   --  3.4  --     Studies: No results found.  Scheduled Meds: . atenolol  25 mg Oral BID  . feeding supplement (ENSURE ENLIVE)  237 mL Oral BID BM  . folic acid  1 mg Oral Daily  . multivitamin with minerals  1 tablet Oral Daily  . pantoprazole  40 mg Oral BID  . PARoxetine  30 mg Oral Daily  . thiamine  100 mg Oral Daily   Continuous Infusions: PRN Meds: ondansetron (ZOFRAN) IV  Time spent: 35 minutes  Author: Berle Mull, MD [facility] 01/26/2020 7:00 PM  To reach On-call, see care teams to locate the attending and reach out to them via [email or web address]. If 7PM-7AM, please contact night-coverage If you still have difficulty reaching the attending provider, please page the [facility] (Director on Call) for [facility] on amion for assistance.

## 2020-01-27 LAB — GLUCOSE, CAPILLARY: Glucose-Capillary: 106 mg/dL — ABNORMAL HIGH (ref 70–99)

## 2020-01-27 MED ORDER — SENNOSIDES-DOCUSATE SODIUM 8.6-50 MG PO TABS
2.0000 | ORAL_TABLET | Freq: Two times a day (BID) | ORAL | Status: DC
  Administered 2020-01-27 – 2020-01-28 (×4): 2 via ORAL
  Filled 2020-01-27 (×4): qty 2

## 2020-01-27 MED ORDER — BISACODYL 10 MG RE SUPP: 10.0000 mg | Freq: Every day | RECTAL | Status: DC | PRN

## 2020-01-27 NOTE — Progress Notes (Signed)
[facility] Progress Note  Patient: Douglas Peterson.    BBC:[id number]  DOA: 01/17/2020     Date of Service: the patient was seen and examined on 01/27/2020  Chief Complaint  Patient presents with  . 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  Presented with complaints of confusion and nausea.  Found to have acute metabolic encephalopathy likely from alcohol intoxication with moderate malnutrition.  Treated conservatively. Currently further plan is arranged for SNF discharge.  Medically stable for transfer.  Assessment and Plan: Acute metabolic encephalopathy:  CT head is negative.  -Treated with vitamin B1 -CIWA protocol -pt tolerating diet.  Alcohol use  -Alcohol intoxication, uncomplicated: alcohol level 307. -CIWA protocol with ativan sister hesitant to use librium due to intolerance in past.   HTN:  -Continue home medications: Atenolol  Malnutrition of moderate degree: BMI 20.06 Speech therapy consulted.  Appreciate assistance.  Currently on soft diet.  Alcoholic cirrhosis of liver without ascites ([place]): Stable no evidence of significant ascites right now.  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  No bleeding -f/u by CBC weekly  GERD (gastroesophageal reflux disease): -Protonix  Depression Stable, no suicidal or homicidal ideations. -Continue home medications: Paxil  Constipation. Treating with bowel regimen.  Monitor. If no improvement will check x-ray abdomen tomorrow may require enema.  Pressure Injury 01/17/20 Groin Anterior;Proximal;Right;Left (Active)  01/17/20 1500  Location: Groin  Location Orientation: Anterior;Proximal;Right;Left  Staging:   Wound Description (Comments):   Present on Admission:      Pressure Injury 01/17/20 (Active)  01/17/20 1500  Location:   Location  Orientation:   Staging:   Wound Description (Comments):   Present on Admission:    Diet: Regular diet DVT Prophylaxis: Subcutaneous Heparin    Advance goals of care discussion: Full code  Family Communication: no family was present at bedside, at the time of interview.   Disposition:  Pt is from home, admitted with generalized fatigue and confusion, still has severe fatigue unable to live on his own requiring SNF for therapy, which precludes a safe discharge. Discharge to SNF, when insurance authorization is available.  Subjective: Reports constipation.  No nausea no vomiting.  Oral intake remains adequate.  Passing gas.  Abdominal pain.  Physical Exam: General:  alert oriented to time, place, and person.  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 Gait not checked due to patient safety concerns  Vitals:   01/26/20 2210 01/27/20 0033 01/27/20 0741 01/27/20 1551  BP: 111/66 111/72 118/76 114/76  Pulse: (!) 54 (!) 54 (!) 51 (!) 59  Resp:   20 20  Temp: 97.8 F (36.6 C) 97.9 F (36.6 C) 97.7 F (36.5 C) 98 F (36.7 C)  TempSrc: Oral Oral Oral Oral  SpO2: 95% 96% 97% 98%  Weight:      Height:        Intake/Output Summary (Last 24 hours) at 01/27/2020 2000 Last data filed at 01/27/2020 1600 Gross per 24 hour  Intake --  Output 1450 ml  Net -1450 ml   Filed Weights   01/17/20 1400  Weight: 53 kg    Data Reviewed: I have personally reviewed and interpreted daily labs, tele strips, imagings as discussed above. I reviewed all nursing notes, pharmacy  notes, vitals, pertinent old records I have discussed plan of care as described above with RN and patient/family.  CBC: Recent Labs  Lab 01/22/20 0537 01/23/20 0510 01/24/20 0422 01/25/20 0507 01/26/20 0437  WBC 4.9 6.3 5.6 4.8 5.6  NEUTROABS 1.7 2.6 2.2 1.5* 2.1  HGB 14.6 14.8 14.1 14.4 14.9  HCT 42.3 43.5 40.8 43.4 44.5  MCV 91.0 91.8 91.9 94.3 94.3  PLT 141* 161 180 190 001   Basic Metabolic Panel: Recent Labs  Lab 01/21/20 0634 01/22/20 0537 01/23/20 0510  NA 138 136 141  K 3.2* 3.4* 3.9  CL 102 104 107  CO2 26 25 26   GLUCOSE 80 101* 102*  BUN 7* 11 18  CREATININE 0.53* 0.43* 0.56*  CALCIUM 8.9 8.9 9.2  MG  --  2.1  --   PHOS  --  3.4  --     Studies: No results found.  Scheduled Meds: . atenolol  25 mg Oral BID  . feeding supplement (ENSURE ENLIVE)  237 mL Oral BID BM  . folic acid  1 mg Oral Daily  . multivitamin with minerals  1 tablet Oral Daily  . pantoprazole  40 mg Oral BID  . PARoxetine  30 mg Oral Daily  . senna-docusate  2 tablet Oral BID  . thiamine  100 mg Oral Daily   Continuous Infusions: PRN Meds: bisacodyl, ondansetron (ZOFRAN) IV  Time spent: 35 minutes  Author: Berle Mull, MD [facility] 01/27/2020 8:00 PM  To reach On-call, see care teams to locate the attending and reach out to them via [email or web address]. If 7PM-7AM, please contact night-coverage If you still have difficulty reaching the attending provider, please page the [facility] (Director on Call) for [facility] on amion for assistance.

## 2020-01-27 NOTE — TOC Progression Note (Signed)
Transition of Care (TOC) - Progression Note    Patient Details  Name: Earnestine Tuohey. MRN: [id number] Date of Birth: 1951/05/16  Transition of Care [facility]) CM/SW Contact  Shelbie Hutching, RN Phone Number: 01/27/2020, 2:49 PM  Clinical Narrative:    Patient still waiting on insurance authorization from [place] to go to [facility].     Expected Discharge Plan: Skilled Nursing Facility Barriers to Discharge: Continued Medical Work up  Expected Discharge Plan and Services Expected Discharge Plan: [place]   Discharge Planning Services: CM Consult   Living arrangements for the past 2 months: Single Family Home Expected Discharge Date: 01/24/20                                     Social Determinants of Health (SDOH) Interventions    Readmission Risk Interventions Readmission Risk Prevention Plan 12/13/2019 12/12/2019  Transportation Screening Complete Complete  PCP or Specialist Appt within 3-5 Days (No Data) -  HRI or [place] (No Data) -  Palliative Care Screening Complete Complete  Medication Review (RN Care Manager) Complete Complete  Some recent data might be hidden

## 2020-01-27 NOTE — Progress Notes (Signed)
Physical Therapy Treatment Patient Details Name: Douglas Peterson. MRN: [id number] DOB: [birth year] Today's Date: 01/27/2020    History of Present Illness Douglas Peterson is a 69yoM who [facility] on 3/16 from home c AMS and persistent nausea. Alcohol level 307 upon arrival. Pt typically independent with ADL at baseline. Drives, performs IADL independently, although MD has asked pt to stop driving. 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    PT Comments    Pt was long sitting in bed upon arriving. He agrees to PT session and is cooperative throughout. Pt was A and O x 4 this date with increased time to respond. He followed commands consistently throughout session. Pt exited R side of bed with CGA for safety. Pt is slightly impulsive and has poor insight on balance deficits. Therapist assisted pt with ADLs like putting shoes and shorts on. He was able to ambulate 100 ft with cane + gait belt and constant min assist for safety to prevent LOB. O2 > 92 % on rm air and HR WFL. He returned to room and was repositioned in recliner with RN in room. Overall tolerated session well and is progressing however PT continues to recommend DC to SNF to address balance, strength, and safe functional mobility deficits.      Follow Up Recommendations  SNF     Equipment Recommendations  None recommended by PT    Recommendations for Other Services       Precautions / Restrictions Precautions Precautions: Fall Restrictions Weight Bearing Restrictions: No    Mobility  Bed Mobility Overal bed mobility: Needs Assistance Bed Mobility: Supine to Sit     Supine to sit: Min guard     General bed mobility comments: Pt is slightly impulsive. CGA for safety. poor sitting balance while performing ADls seated EOB  Transfers Overall transfer level: Needs assistance Equipment used: Straight cane Transfers: Sit to/from Stand Sit to Stand: Min guard;Min assist         General  transfer comment: Pt required min assist to stand from lowest bed height. vcs for improved technique with use of cane.  Ambulation/Gait Ambulation/Gait assistance: Min assist Gait Distance (Feet): 100 Feet Assistive device: Straight cane Gait Pattern/deviations: Step-through pattern;Decreased step length - right;Decreased step length - left;Narrow base of support Gait velocity: decreased   General Gait Details: pt continues to be unsteady with gait. poor awareness of deficits. Therapist cued pt for improved gait sequencing and RLE advancement.   Stairs             Wheelchair Mobility    Modified Rankin (Stroke Patients Only)       Balance                                            Cognition Arousal/Alertness: Awake/alert Behavior During Therapy: WFL for tasks assessed/performed;Impulsive Overall Cognitive Status: Within Functional Limits for tasks assessed                                 General Comments: Pt was A and O x 4. He followed commands consistantly and was very motivated to participate      Exercises      General Comments        Pertinent Vitals/Pain Pain Assessment: No/denies pain    Home  Living                      Prior Function            PT Goals (current goals can now be found in the care plan section) Acute Rehab PT Goals Patient Stated Goal: "I want to get better so I can go home" Progress towards PT goals: Progressing toward goals    Frequency    Min 2X/week      PT Plan Current plan remains appropriate    Co-evaluation              AM-PAC PT "6 Clicks" Mobility   Outcome Measure  Help needed turning from your back to your side while in a flat bed without using bedrails?: A Little Help needed moving from lying on your back to sitting on the side of a flat bed without using bedrails?: A Little Help needed moving to and from a bed to a chair (including a wheelchair)?: A  Little Help needed standing up from a chair using your arms (e.g., wheelchair or bedside chair)?: A Little Help needed to walk in hospital room?: A Lot Help needed climbing 3-5 steps with a railing? : Total 6 Click Score: 15    End of Session Equipment Utilized During Treatment: Gait belt Activity Tolerance: Patient tolerated treatment well Patient left: in chair;with call bell/phone within reach;with chair alarm set Nurse Communication: Mobility status PT Visit Diagnosis: Unsteadiness on feet (R26.81);Difficulty in walking, not elsewhere classified (R26.2);Other abnormalities of gait and mobility (R26.89)     Time: 6438-3779 PT Time Calculation (min) (ACUTE ONLY): 14 min  Charges:  $Gait Training: 8-22 mins                     Julaine Fusi PTA 01/27/20, 11:17 AM

## 2020-01-28 LAB — CBC WITH DIFFERENTIAL/PLATELET
Abs Immature Granulocytes: 0.01 10*3/uL (ref 0.00–0.07)
Basophils Absolute: 0.1 10*3/uL (ref 0.0–0.1)
Basophils Relative: 1 %
Eosinophils Absolute: 0.1 10*3/uL (ref 0.0–0.5)
Eosinophils Relative: 2 %
HCT: 50 % (ref 39.0–52.0)
Hemoglobin: 16.6 g/dL (ref 13.0–17.0)
Immature Granulocytes: 0 %
Lymphocytes Relative: 54 %
Lymphs Abs: 2.9 10*3/uL (ref 0.7–4.0)
MCH: 31.4 pg (ref 26.0–34.0)
MCHC: 33.2 g/dL (ref 30.0–36.0)
MCV: 94.7 fL (ref 80.0–100.0)
Monocytes Absolute: 0.4 10*3/uL (ref 0.1–1.0)
Monocytes Relative: 7 %
Neutro Abs: 1.9 10*3/uL (ref 1.7–7.7)
Neutrophils Relative %: 36 %
Platelets: 255 10*3/uL (ref 150–400)
RBC: 5.28 MIL/uL (ref 4.22–5.81)
RDW: 13.3 % (ref 11.5–15.5)
WBC: 5.3 10*3/uL (ref 4.0–10.5)
nRBC: 0 % (ref 0.0–0.2)

## 2020-01-28 LAB — COMPREHENSIVE METABOLIC PANEL
ALT: 40 U/L (ref 0–44)
AST: 37 U/L (ref 15–41)
Albumin: 4.2 g/dL (ref 3.5–5.0)
Alkaline Phosphatase: 71 U/L (ref 38–126)
Anion gap: 9 (ref 5–15)
BUN: 12 mg/dL (ref 8–23)
CO2: 26 mmol/L (ref 22–32)
Calcium: 9.2 mg/dL (ref 8.9–10.3)
Chloride: 103 mmol/L (ref 98–111)
Creatinine, Ser: 0.56 mg/dL — ABNORMAL LOW (ref 0.61–1.24)
GFR calc Af Amer: >60 mL/min (ref >60)
GFR calc non Af Amer: >60 mL/min (ref >60)
Glucose, Bld: 115 mg/dL — ABNORMAL HIGH (ref 70–99)
Potassium: 3.5 mmol/L (ref 3.5–5.1)
Sodium: 138 mmol/L (ref 135–145)
Total Bilirubin: 0.8 mg/dL (ref 0.3–1.2)
Total Protein: 7.5 g/dL (ref 6.5–8.1)

## 2020-01-28 LAB — MAGNESIUM: Magnesium: 2 mg/dL (ref 1.7–2.4)

## 2020-01-28 LAB — GLUCOSE, CAPILLARY
Glucose-Capillary: 84 mg/dL (ref 70–99)
Glucose-Capillary: 103 mg/dL — ABNORMAL HIGH (ref 70–99)

## 2020-01-28 NOTE — Progress Notes (Signed)
[facility] Progress Note  Patient: Douglas Peterson.    XAJ:[id number]  DOA: 01/17/2020     Date of Service: the patient was seen and examined on 01/28/2020  Chief Complaint  Patient presents with  . 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  Presented with complaints of confusion and nausea.  Found to have acute metabolic encephalopathy likely from alcohol intoxication with moderate malnutrition.  Treated conservatively. Currently further plan is arranged for SNF discharge.  Medically stable for transfer.  Assessment and Plan: Acute metabolic encephalopathy:  CT head is negative.  -Treated with vitamin B1 -CIWA protocol -pt tolerating diet.  Alcohol use  -Alcohol intoxication, uncomplicated: alcohol level 307. -CIWA protocol with ativan sister hesitant to use librium due to intolerance in past.   HTN:  -Continue home medications: Atenolol  Malnutrition of moderate degree: BMI 20.06 Speech therapy consulted.  Appreciate assistance.  Currently on soft diet.  Alcoholic cirrhosis of liver without ascites ([place]): Stable no evidence of significant ascites right now.  Thrombocytopenia ([place]):  due to liver cirrhosis.  No bleeding -f/u by CBC weekly  GERD (gastroesophageal reflux disease): -Protonix  Depression Stable, no suicidal or homicidal ideations. -Continue home medications: Paxil  Constipation.  Resolved Treating with bowel regimen.  Monitor.  Pressure Injury 01/17/20 Groin Anterior;Proximal;Right;Left (Active)  01/17/20 1500  Location: Groin  Location Orientation: Anterior;Proximal;Right;Left  Staging:   Wound Description (Comments):   Present on Admission:      Pressure Injury 01/17/20 (Active)  01/17/20 1500  Location:   Location Orientation:   Staging:   Wound Description (Comments):    Present on Admission:    Diet: Regular diet DVT Prophylaxis: Subcutaneous Heparin    Advance goals of care discussion: Full code  Family Communication: no family was present at bedside, at the time of interview.   Disposition:  Pt is from home, admitted with generalized fatigue and confusion, still has severe fatigue unable to live on his own requiring SNF for therapy, which precludes a safe discharge. Discharge to SNF, when insurance authorization is available.  Subjective: Constipation improved.  Feeling better  Physical Exam: General:  alert oriented to time, place, and person.  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 Gait not checked due to patient safety concerns  Vitals:   01/27/20 0741 01/27/20 1551 01/27/20 2145 01/28/20 0917  BP: 118/76 114/76 115/67 129/85  Pulse: (!) 51 (!) 59 (!) 53 65  Resp: 20 20 19 20   Temp: 97.7 F (36.5 C) 98 F (36.7 C) 97.6 F (36.4 C) 97.9 F (36.6 C)  TempSrc: Oral Oral Oral Oral  SpO2: 97% 98% 97% 98%  Weight:      Height:        Intake/Output Summary (Last 24 hours) at 01/28/2020 1941 Last data filed at 01/28/2020 1917 Gross per 24 hour  Intake 600 ml  Output -  Net 600 ml   Filed Weights   01/17/20 1400  Weight: 53 kg    Data Reviewed: I have personally  reviewed and interpreted daily labs, tele strips, imagings as discussed above. I reviewed all nursing notes, pharmacy notes, vitals, pertinent old records I have discussed plan of care as described above with RN and patient/family.  CBC: Recent Labs   Lab 01/23/20 0510 01/24/20 0422 01/25/20 0507 01/26/20 0437 01/28/20 1003  WBC 6.3 5.6 4.8 5.6 5.3  NEUTROABS 2.6 2.2 1.5* 2.1 1.9  HGB 14.8 14.1 14.4 14.9 16.6  HCT 43.5 40.8 43.4 44.5 50.0  MCV 91.8 91.9 94.3 94.3 94.7  PLT 161 180 190 221 683   Basic Metabolic Panel: Recent Labs  Lab 01/22/20 0537 01/23/20 0510 01/28/20 1003  NA 136 141 138  K 3.4* 3.9 3.5  CL 104 107 103  CO2 25 26 26   GLUCOSE 101* 102* 115*  BUN 11 18 12   CREATININE 0.43* 0.56* 0.56*  CALCIUM 8.9 9.2 9.2  MG 2.1  --  2.0  PHOS 3.4  --   --     Studies: No results found.  Scheduled Meds: . atenolol  25 mg Oral BID  . feeding supplement (ENSURE ENLIVE)  237 mL Oral BID BM  . folic acid  1 mg Oral Daily  . multivitamin with minerals  1 tablet Oral Daily  . pantoprazole  40 mg Oral BID  . PARoxetine  30 mg Oral Daily  . senna-docusate  2 tablet Oral BID  . thiamine  100 mg Oral Daily   Continuous Infusions: PRN Meds: bisacodyl, ondansetron (ZOFRAN) IV  Time spent: 35 minutes  Author: Berle Mull, MD [facility] 01/28/2020 7:41 PM  To reach On-call, see care teams to locate the attending and reach out to them via [email or web address]. If 7PM-7AM, please contact night-coverage If you still have difficulty reaching the attending provider, please page the [facility] (Director on Call) for [facility] on amion for assistance.

## 2020-01-29 LAB — GLUCOSE, CAPILLARY: Glucose-Capillary: 79 mg/dL (ref 70–99)

## 2020-01-29 MED ORDER — SENNOSIDES-DOCUSATE SODIUM 8.6-50 MG PO TABS
1.0000 | ORAL_TABLET | Freq: Every day | ORAL | Status: DC
  Administered 2020-01-29: 22:00:00 1 via ORAL
  Filled 2020-01-29: qty 1

## 2020-01-29 MED ORDER — ENOXAPARIN SODIUM 40 MG/0.4ML ~~LOC~~ SOLN
40.0000 mg | SUBCUTANEOUS | Status: DC
  Administered 2020-01-29: 40 mg via SUBCUTANEOUS
  Filled 2020-01-29: qty 0.4

## 2020-01-29 MED ORDER — ACETAMINOPHEN 325 MG PO TABS: 650.0000 mg | ORAL_TABLET | Freq: Four times a day (QID) | ORAL | Status: DC | PRN

## 2020-01-29 MED ORDER — BACLOFEN 10 MG PO TABS
10.0000 mg | ORAL_TABLET | Freq: Three times a day (TID) | ORAL | Status: DC
  Administered 2020-01-29 – 2020-01-30 (×4): 10 mg via ORAL
  Filled 2020-01-29 (×6): qty 1

## 2020-01-29 MED ORDER — ACETAMINOPHEN 650 MG RE SUPP: 650.0000 mg | Freq: Four times a day (QID) | RECTAL | Status: DC | PRN

## 2020-01-29 NOTE — TOC Progression Note (Signed)
Transition of Care (TOC) - Progression Note    Patient Details  Name: Douglas Peterson. MRN: [id number] Date of Birth: 1951/02/13  Transition of Care [facility]) CM/SW Contact  Boris Sharper, [place] Phone Number: 01/29/2020, 2:41 PM  Clinical Narrative:    CSW called Neoma Laming at [facility] to follow up on [employer]. No answer left voicemail.  TOC will continue to follow for discharge planning needs.   Expected Discharge Plan: Skilled Nursing Facility Barriers to Discharge: Continued Medical Work up  Expected Discharge Plan and Services Expected Discharge Plan: [place]   Discharge Planning Services: CM Consult   Living arrangements for the past 2 months: Single Family Home Expected Discharge Date: 01/24/20                                     Social Determinants of Health (SDOH) Interventions    Readmission Risk Interventions Readmission Risk Prevention Plan 12/13/2019 12/12/2019  Transportation Screening Complete Complete  PCP or Specialist Appt within 3-5 Days (No Data) -  HRI or [place] (No Data) -  Palliative Care Screening Complete Complete  Medication Review (RN Care Manager) Complete Complete  Some recent data might be hidden

## 2020-01-29 NOTE — Progress Notes (Signed)
[facility] Progress Note  Patient: Douglas Peterson.    WRU:[id number]  DOA: 01/17/2020     Date of Service: the patient was seen and examined on 01/29/2020  Chief Complaint  Patient presents with  . 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  Presented with complaints of confusion and nausea.  Found to have acute metabolic encephalopathy likely from alcohol intoxication with moderate malnutrition.  Treated conservatively. Currently further plan is awaiting insurance authorization for SNF discharge.  Medically stable for transfer.  Assessment and Plan: 1.  Acute metabolic encephalopathy Currently back to baseline. CT head negative. Treated with vitamin B1.  2.  Alcohol use Alcohol intoxication CIWA protocol was initiated. Patient currently out of the withdrawal.. Continue vitamin B-1.  3.  Essential hypertension Blood pressure stable. Continue atenolol  4.  Hepatic steatosis.  No cirrhosis seen on the CT scan. Currently no evidence of decompensation. We will continue to monitor for now.  5.  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 No evidence of active bleeding. Monitor.  6.  GERD Esophagitis Dysphagia Continue Protonix. Currently able to tolerate oral diet without any problem.  7.  History of depression. Currently stable no suicidal or homicidal ideation. Continue home regimen.  8.  Constipation. Currently resolved. Continue nightly Senokot.  9.  Proctitis. Seen on CT  Currently no complaint. We will monitor.  10 BPH. Currently again no symptoms.  Will monitor.  11.  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 No point tenderness no pain. SP vertebroplasty of L2. Also chronic L1 compression deformity. This is secondary to  patient's recurrent falls. PT recommends SNF. Patient unsafe to go home due to above.  12. Recurrent fall Physical deconditioning PT working, last recommendation is for SNF.  Awaiting insurance auth  Diet: Regular diet DVT Prophylaxis: Subcutaneous Lovenox   Advance goals of care discussion: Full code  Family Communication: no family was present at bedside, at the time of interview.   Disposition:  Pt is from home, admitted with alcohol intoxication and metabolic encephalopathy, still has severe deconditioning leading to recurrent fall, unsafe to go home, which precludes a safe discharge. Discharge to SNF, when insurance authorization is available.  Subjective: asking to resume his home baclofen 10 mg tid, no acute complains,no constipation   Physical Exam: General:  alert oriented to time, place, and person.  Appear in mild distress, affect appropriate ENT: Oral Mucosa Clear, moist  Neck: difficult to assess  JVD,  Cardiovascular: S1 and S2 Present, no Murmur,  Respiratory: good respiratory effort, Bilateral Air entry equal and Decreased, no Crackles, no wheezes Abdomen: Bowel Sound present, Soft and no tenderness,  Skin: no rash Extremities: no Pedal edema,  Neurologic: chronic right sided spasticity Gait not checked due to patient safety concerns  Vitals:   01/27/20 1551 01/27/20 2145 01/28/20 0917 01/29/20 0055  BP: 114/76 115/67 129/85 115/73  Pulse: (!) 59 (!) 53 65 (!) 54  Resp: 20 19 20 16   Temp: 98 F (36.7 C) 97.6 F (36.4 C) 97.9 F (36.6 C) (!) 97.4 F (36.3 C)  TempSrc: Oral Oral Oral Oral  SpO2: 98% 97% 98% 96%  Weight:      Height:        Intake/Output Summary (Last 24 hours) at 01/29/2020 0723 Last data filed at 01/29/2020 0541 Gross per 24 hour  Intake 600  ml  Output 400 ml  Net 200 ml   Filed Weights   01/17/20 1400  Weight: 53 kg    Data Reviewed: I have personally reviewed and interpreted daily labs, tele strips, imagings as discussed  above. I reviewed all nursing notes, pharmacy notes, vitals, pertinent old records I have discussed plan of care as described above with RN and patient/family.  CBC: Recent Labs  Lab 01/23/20 0510 01/24/20 0422 01/25/20 0507 01/26/20 0437 01/28/20 1003  WBC 6.3 5.6 4.8 5.6 5.3  NEUTROABS 2.6 2.2 1.5* 2.1 1.9  HGB 14.8 14.1 14.4 14.9 16.6  HCT 43.5 40.8 43.4 44.5 50.0  MCV 91.8 91.9 94.3 94.3 94.7  PLT 161 180 190 221 575   Basic Metabolic Panel: Recent Labs  Lab 01/23/20 0510 01/28/20 1003  NA 141 138  K 3.9 3.5  CL 107 103  CO2 26 26  GLUCOSE 102* 115*  BUN 18 12  CREATININE 0.56* 0.56*  CALCIUM 9.2 9.2  MG  --  2.0    Studies: No results found.  Scheduled Meds: . atenolol  25 mg Oral BID  . feeding supplement (ENSURE ENLIVE)  237 mL Oral BID BM  . folic acid  1 mg Oral Daily  . multivitamin with minerals  1 tablet Oral Daily  . pantoprazole  40 mg Oral BID  . PARoxetine  30 mg Oral Daily  . senna-docusate  1 tablet Oral QHS  . thiamine  100 mg Oral Daily   Continuous Infusions: PRN Meds: acetaminophen **OR** acetaminophen, bisacodyl, ondansetron (ZOFRAN) IV  Time spent: 35 minutes  Author: Berle Mull, MD [facility] 01/29/2020 7:23 AM  To reach On-call, see care teams to locate the attending and reach out to them via [email or web address]. If 7PM-7AM, please contact night-coverage If you still have difficulty reaching the attending provider, please page the [facility] (Director on Call) for [facility] on amion for assistance.

## 2020-01-30 MED ORDER — THIAMINE HCL 100 MG PO TABS: 100.0000 mg | ORAL_TABLET | Freq: Every day | ORAL | 0 refills | Status: DC

## 2020-01-30 MED ORDER — FOLIC ACID 1 MG PO TABS: 1.0000 mg | ORAL_TABLET | Freq: Every day | ORAL | 0 refills | Status: DC

## 2020-01-30 NOTE — TOC Progression Note (Signed)
Transition of Care (TOC) - Progression Note    Patient Details  Name: Douglas Peterson. MRN: [id number] Date of Birth: [birth year]  Transition of Care [facility]) CM/SW Contact  , Gardiner Rhyme, LCSW Phone Number: 01/30/2020, 12:02 PM  Clinical Narrative:   PT feels pt can go home now with [facility] services. Pt is agreeable to this and has no pref regarding [place] agency. He has all equipment and has transport home. Have contacted Deborah-WOM to inform of this plan for home and not pursuing SNF any longer.     Expected Discharge Plan: Skilled Nursing Facility Barriers to Discharge: Continued Medical Work up  Expected Discharge Plan and Services Expected Discharge Plan: [place]   Discharge Planning Services: CM Consult   Living arrangements for the past 2 months: Single Family Home Expected Discharge Date: 01/30/20                                     Social Determinants of Health (SDOH) Interventions    Readmission Risk Interventions Readmission Risk Prevention Plan 12/13/2019 12/12/2019  Transportation Screening Complete Complete  PCP or Specialist Appt within 3-5 Days (No Data) -  HRI or [place] (No Data) -  Palliative Care Screening Complete Complete  Medication Review (RN Care Manager) Complete Complete  Some recent data might be hidden

## 2020-01-30 NOTE — TOC Progression Note (Signed)
Transition of Care (TOC) - Progression Note    Patient Details  Name: Douglas Peterson. MRN: [id number] Date of Birth: December 13, 1950  Transition of Care [facility]) CM/SW Contact  , Gardiner Rhyme, LCSW Phone Number: 01/30/2020, 9:27 AM  Clinical Narrative:  Spoke with deborah-WOM who reports [name] has denied and seek a peer to peer. Have given MD the information for this to happen before 12:00 today. MD aware and will await PT seeing pt today before calling insurance. Await decision     Expected Discharge Plan: [place] Barriers to Discharge: Continued Medical Work up  Expected Discharge Plan and Services Expected Discharge Plan: [name]   Discharge Planning Services: CM Consult   Living arrangements for the past 2 months: Single Family Home Expected Discharge Date: 01/24/20                                     Social Determinants of Health (SDOH) Interventions    Readmission Risk Interventions Readmission Risk Prevention Plan 12/13/2019 12/12/2019  Transportation Screening Complete Complete  PCP or Specialist Appt within 3-5 Days (No Data) -  HRI or [place] (No Data) -  Palliative Care Screening Complete Complete  Medication Review (RN Care Manager) Complete Complete  Some recent data might be hidden

## 2020-01-30 NOTE — Discharge Instructions (Signed)
Alcohol Abuse and Dependence Information, Adult Alcohol is a widely available drug. People drink alcohol in different amounts. People who drink alcohol very often and in large amounts often have problems during and after drinking. They may develop what is called an alcohol use disorder. There are two main types of alcohol use disorders:  Alcohol abuse. This is when you use alcohol too much or too often. You may use alcohol to make yourself feel happy or to reduce stress. You may have a hard time setting a limit on the amount you drink.  Alcohol dependence. This is when you use alcohol consistently for a period of time, and your body changes as a result. This can make it hard to stop drinking because you may start to feel sick or feel different when you do not use alcohol. These symptoms are known as withdrawal. How can alcohol abuse and dependence affect me? Alcohol abuse and dependence can have a negative effect on your life. Drinking too much can lead to addiction. You may feel like you need alcohol to function normally. You may drink alcohol before work in the morning, during the day, or as soon as you get home from work in the evening. These actions can result in:  Poor work performance.  Job loss.  Financial problems.  Car crashes or criminal charges from driving after drinking alcohol.  Problems in your relationships with friends and family.  Losing the trust and respect of coworkers, friends, and family. Drinking heavily over a long period of time can permanently damage your body and brain, and can cause lifelong health issues, such as:  Damage to your liver or pancreas.  Heart problems, high blood pressure, or stroke.  Certain cancers.  Decreased ability to fight infections.  Brain or nerve damage.  Depression.  Early (premature) death. If you are careless or you crave alcohol, it is easy to drink more than your body can handle (overdose). Alcohol overdose is a serious  situation that requires hospitalization. It may lead to permanent injuries or death. What can increase my risk?  Having a family history of alcohol abuse.  Having depression or other mental health conditions.  Beginning to drink at an early age.  Binge drinking often.  Experiencing trauma, stress, and an unstable home life during childhood.  Spending time with people who drink often. What actions can I take to prevent or manage alcohol abuse and dependence?  Do not drink alcohol if: ? Your health care provider tells you not to drink. ? You are pregnant, may be pregnant, or are planning to become pregnant.  If you drink alcohol: ? Limit how much you use to:  0-1 drink a day for women.  0-2 drinks a day for men. ? Be aware of how much alcohol is in your drink. In the [place] one drink equals one 12 oz bottle of beer (355 mL), one 5 oz glass of wine (148 mL), or one 1 oz glass of hard liquor (44 mL).  Stop drinking if you have been drinking too much. This can be very hard to do if you are used to abusing alcohol. If you begin to have withdrawal symptoms, talk with your health care provider or a person that you trust. These symptoms may include anxiety, shaky hands, headache, nausea, sweating, or not being able to sleep.  Choose to drink nonalcoholic beverages in social gatherings and places where there may be alcohol. Activity  Spend more time on activities that you enjoy that  do not involve alcohol, like hobbies or exercise.  Find healthy ways to cope with stress, such as exercise, meditation, or spending time with people you care about. General information  Talk to your family, coworkers, and friends about supporting you in your efforts to stop drinking. If they drink, ask them not to drink around you. Spend more time with people who do not drink alcohol.  If you think that you have an alcohol dependency problem: ? Tell friends or family about your concerns. ? Talk with your  health care provider or another health professional about where to get help. ? Work with a Transport planner and a Regulatory affairs officer. ? Consider joining a support group for people who struggle with alcohol abuse and dependence. Where to find support   Your health care provider.  SMART Recovery: www.smartrecovery.org Therapy and support groups  Local treatment centers or chemical dependency counselors.  Local AA groups in your community: NicTax.com.pt Where to find more information  Centers for Disease Control and Prevention: http://www.wolf.info/  National Institute on Alcohol Abuse and Alcoholism: http://www.bradshaw.com/  Alcoholics Anonymous (AA): NicTax.com.pt Contact a health care provider if:  You drank more or for longer than you intended on more than one occasion.  You tried to stop drinking or to cut back on how much you drink, but you were not able to.  You often drink to the point of vomiting or passing out.  You want to drink so badly that you cannot think about anything else.  You have problems in your life due to drinking, but you continue to drink.  You keep drinking even though you feel anxious, depressed, or have experienced memory loss.  You have stopped doing the things you used to enjoy in order to drink.  You have to drink more than you used to in order to get the effect you want.  You experience anxiety, sweating, nausea, shakiness, and trouble sleeping when you try to stop drinking. Get help right away if:  You have thoughts about hurting yourself or others.  You have serious withdrawal symptoms, including: ? Confusion. ? Racing heart. ? High blood pressure. ? Fever. If you ever feel like you may hurt yourself or others, or have thoughts about taking your own life, get help right away. You can go to your nearest emergency department or call:  Your local emergency services (911 in the [place]  A suicide crisis helpline, such as the [place] at 906-242-2050. This is open 24 hours a day. Summary  Alcohol abuse and dependence can have a negative effect on your life. Drinking too much or too often can lead to addiction.  If you drink alcohol, limit how much you use.  If you are having trouble keeping your drinking under control, find ways to change your behavior. Hobbies, calming activities, exercise, or support groups can help.  If you feel you need help with changing your drinking habits, talk with your health care provider, a good friend, or a therapist, or go to an [facility] group. This information is not intended to replace advice given to you by your health care provider. Make sure you discuss any questions you have with your health care provider. Document Revised: 02/08/2019 Document Reviewed: 12/28/2018 Elsevier Patient Education  [place].    Esophagitis  Esophagitis is inflammation of the esophagus. The esophagus is the tube that carries food from your mouth to your stomach. Esophagitis can cause soreness or pain in the esophagus. This condition  can make it difficult and painful to swallow. What are the causes? Most causes of esophagitis are not serious. Common causes of this condition include:  Gastroesophageal reflux disease (GERD). This is when stomach contents move back up into the esophagus (reflux).  Repeated vomiting.  An allergic reaction, especially caused by food allergies (eosinophilic esophagitis).  Injury to the esophagus by swallowing large pills with or without water, or swallowing certain types of medicines.  Swallowing (ingesting) harmful chemicals, such as household cleaning products.  Heavy alcohol use.  An infection of the esophagus. This most often occurs in people who have a weakened immune system.  Radiation or chemotherapy treatment for cancer.  Certain diseases such as sarcoidosis, Crohn's disease, and scleroderma. What are the signs or symptoms? Symptoms of this  condition include:  Difficult or painful swallowing.  Pain with swallowing acidic liquids, such as citrus juices.  Pain with burping.  Chest pain.  Difficulty breathing.  Nausea.  Vomiting.  Pain in the abdomen.  Weight loss.  Ulcers in the mouth.  Patches of white material in the mouth (candidiasis).  Fever.  Coughing up blood or vomiting blood.  Stool that is black, tarry, or bright red. How is this diagnosed? Your health care provider will take a medical history and perform a physical exam. You may also have other tests, including:  An endoscopy to examine your esophagus and stomach with a small flexible tube with a camera.  A test that measures the acidity level in your esophagus.  A test that measures how much pressure is on your esophagus.  A barium swallow or modified barium swallow to show the shape, size, and functioning of your esophagus.  Allergy tests. How is this treated? Treatment for this condition depends on the cause of your esophagitis. In some cases, steroids or other medicines may be given to help relieve your symptoms or to treat the underlying cause of your condition. You may have to make some lifestyle changes, such as:  Avoiding alcohol.  Quitting smoking.  Changing your diet.  Exercising.  Changing your sleep habits and your sleep environment. Follow these instructions at home: Medicines  Take over-the-counter and prescription medicines only as told by your health care provider.  Do not take aspirin, ibuprofen, or other NSAIDs unless your health care provider told you to do so.  If you have trouble taking pills: ? Use a pill splitter to decrease the size of the pill. This will decrease the chance of the pill getting stuck or injuring your esophagus. ? Drink water after you take a pill. Eating and drinking   Avoid foods and drinks that seem to make your symptoms worse.  Follow a diet as recommended by your health care  provider. This may involve avoiding foods and drinks such as: ? Coffee and tea (with or without caffeine). ? Drinks that contain alcohol. ? Energy drinks and sports drinks. ? Carbonated drinks or sodas. ? Chocolate and cocoa. ? Peppermint and mint flavorings. ? Garlic and onions. ? Horseradish. ? Spicy and acidic foods, including peppers, chili powder, curry powder, vinegar, hot sauces, and barbecue sauce. ? Citrus fruit juices and citrus fruits, such as oranges, lemons, and limes. ? Tomato-based foods, such as red sauce, chili, salsa, and pizza with red sauce. ? Fried and fatty foods, such as donuts, french fries, potato chips, and high-fat dressings. ? High-fat meats, such as hot dogs and fatty cuts of red and white meats, such as rib eye steak, sausage, ham, and bacon. ?  High-fat dairy items, such as whole milk, butter, and cream cheese. Lifestyle  Eat small, frequent meals instead of large meals.  Avoid drinking large amounts of liquid with your meals.  Avoid eating meals during the 2-3 hours before bedtime.  Avoid lying down right after you eat.  Do not exercise right after you eat.  Do not use any products that contain nicotine or tobacco, such as cigarettes and e-cigarettes. If you need help quitting, ask your health care provider. General instructions   Pay attention to any changes in your symptoms. Let your health care provider know about them.  Wear loose-fitting clothing. Do not wear anything tight around your waist that causes pressure on your abdomen.  Raise (elevate) the head of your bed about 6 inches (15 cm).  Try relaxation strategies such as yoga, deep breathing, or meditation to manage stress. If you need help reducing stress, ask your health care provider.  If you are overweight, reduce your weight to an amount that is healthy for you. Ask your health care provider for guidance about a safe weight loss goal.  Keep all follow-up visits as told by your  health care provider. This is important. Contact a health care provider if:  You have new symptoms.  You have unexplained weight loss.  You have difficulty swallowing, or it hurts to swallow.  You have wheezing or a cough that does not go away.  Your symptoms do not improve with treatment.  You have frequent heartburn for more than two weeks. Get help right away if:  You have severe pain in your arms, neck, jaw, teeth, or back.  You feel sweaty, dizzy, or light-headed.  You have chest pain or shortness of breath.  You vomit and your vomit looks like blood or coffee grounds.  Your stool is bloody or black.  You have a fever.  You cannot swallow, drink, or eat. Summary  Esophagitis is inflammation of the esophagus.  Most causes of esophagitis are not serious.  Follow your health care provider's instructions about eating and drinking. Follow instructions on medicines.  Contact a health care provider if you have new symptoms, have weight loss, or coughing that does not stop.  Get help right away if you have severe pain in the arms, neck, jaw, teeth or back, or if you have chest pain, shortness of breath, or fever. This information is not intended to replace advice given to you by your health care provider. Make sure you discuss any questions you have with your health care provider. Document Revised: 06/11/2018 Document Reviewed: 06/11/2018 Elsevier Patient Education  [place].  Alcohol Abuse and Dependence Information, Adult Alcohol is a widely available drug. People drink alcohol in different amounts. People who drink alcohol very often and in large amounts often have problems during and after drinking. They may develop what is called an alcohol use disorder. There are two main types of alcohol use disorders:  Alcohol abuse. This is when you use alcohol too much or too often. You may use alcohol to make yourself feel happy or to reduce stress. You may have a hard  time setting a limit on the amount you drink.  Alcohol dependence. This is when you use alcohol consistently for a period of time, and your body changes as a result. This can make it hard to stop drinking because you may start to feel sick or feel different when you do not use alcohol. These symptoms are known as withdrawal. How can alcohol  abuse and dependence affect me? Alcohol abuse and dependence can have a negative effect on your life. Drinking too much can lead to addiction. You may feel like you need alcohol to function normally. You may drink alcohol before work in the morning, during the day, or as soon as you get home from work in the evening. These actions can result in:  Poor work performance.  Job loss.  Financial problems.  Car crashes or criminal charges from driving after drinking alcohol.  Problems in your relationships with friends and family.  Losing the trust and respect of coworkers, friends, and family. Drinking heavily over a long period of time can permanently damage your body and brain, and can cause lifelong health issues, such as:  Damage to your liver or pancreas.  Heart problems, high blood pressure, or stroke.  Certain cancers.  Decreased ability to fight infections.  Brain or nerve damage.  Depression.  Early (premature) death. If you are careless or you crave alcohol, it is easy to drink more than your body can handle (overdose). Alcohol overdose is a serious situation that requires hospitalization. It may lead to permanent injuries or death. What can increase my risk?  Having a family history of alcohol abuse.  Having depression or other mental health conditions.  Beginning to drink at an early age.  Binge drinking often.  Experiencing trauma, stress, and an unstable home life during childhood.  Spending time with people who drink often. What actions can I take to prevent or manage alcohol abuse and dependence?  Do not drink alcohol  if: ? Your health care provider tells you not to drink. ? You are pregnant, may be pregnant, or are planning to become pregnant.  If you drink alcohol: ? Limit how much you use to:  0-1 drink a day for women.  0-2 drinks a day for men. ? Be aware of how much alcohol is in your drink. In the [place] one drink equals one 12 oz bottle of beer (355 mL), one 5 oz glass of wine (148 mL), or one 1 oz glass of hard liquor (44 mL).  Stop drinking if you have been drinking too much. This can be very hard to do if you are used to abusing alcohol. If you begin to have withdrawal symptoms, talk with your health care provider or a person that you trust. These symptoms may include anxiety, shaky hands, headache, nausea, sweating, or not being able to sleep.  Choose to drink nonalcoholic beverages in social gatherings and places where there may be alcohol. Activity  Spend more time on activities that you enjoy that do not involve alcohol, like hobbies or exercise.  Find healthy ways to cope with stress, such as exercise, meditation, or spending time with people you care about. General information  Talk to your family, coworkers, and friends about supporting you in your efforts to stop drinking. If they drink, ask them not to drink around you. Spend more time with people who do not drink alcohol.  If you think that you have an alcohol dependency problem: ? Tell friends or family about your concerns. ? Talk with your health care provider or another health professional about where to get help. ? Work with a Transport planner and a Regulatory affairs officer. ? Consider joining a support group for people who struggle with alcohol abuse and dependence. Where to find support   Your health care provider.  SMART Recovery: www.smartrecovery.org Therapy and support groups  Local treatment centers or chemical  dependency counselors.  Local AA groups in your community: NicTax.com.pt Where to find more  information  Centers for Disease Control and Prevention: http://www.wolf.info/  National Institute on Alcohol Abuse and Alcoholism: http://www.bradshaw.com/  Alcoholics Anonymous (AA): NicTax.com.pt Contact a health care provider if:  You drank more or for longer than you intended on more than one occasion.  You tried to stop drinking or to cut back on how much you drink, but you were not able to.  You often drink to the point of vomiting or passing out.  You want to drink so badly that you cannot think about anything else.  You have problems in your life due to drinking, but you continue to drink.  You keep drinking even though you feel anxious, depressed, or have experienced memory loss.  You have stopped doing the things you used to enjoy in order to drink.  You have to drink more than you used to in order to get the effect you want.  You experience anxiety, sweating, nausea, shakiness, and trouble sleeping when you try to stop drinking. Get help right away if:  You have thoughts about hurting yourself or others.  You have serious withdrawal symptoms, including: ? Confusion. ? Racing heart. ? High blood pressure. ? Fever. If you ever feel like you may hurt yourself or others, or have thoughts about taking your own life, get help right away. You can go to your nearest emergency department or call:  Your local emergency services (911 in the [place]  A suicide crisis helpline, such as the [place] at 516-662-9974. This is open 24 hours a day. Summary  Alcohol abuse and dependence can have a negative effect on your life. Drinking too much or too often can lead to addiction.  If you drink alcohol, limit how much you use.  If you are having trouble keeping your drinking under control, find ways to change your behavior. Hobbies, calming activities, exercise, or support groups can help.  If you feel you need help with changing your drinking habits, talk with your  health care provider, a good friend, or a therapist, or go to an [facility] group. This information is not intended to replace advice given to you by your health care provider. Make sure you discuss any questions you have with your health care provider. Document Revised: 02/08/2019 Document Reviewed: 12/28/2018 Elsevier Patient Education  2020 [place] in Hospitals, Adult Being a patient in the hospital puts you at risk for falling. Falls can cause serious injury and harm, but they can be prevented. It is important to understand what puts you at risk for falling and what you and your health care team can do to prevent you from falling. If you or a loved one falls at the hospital, it is important to tell hospital staff about it. What increases the risk for falls? Certain conditions and treatments may increase your risk of falling in the hospital. These include:  Being in an unfamiliar environment, especially when using the bathroom at night.  Having surgery.  Being on bed rest.  Taking many medicines or certain types of medicines, such as sleeping pills.  Having tubes in place, such as IV lines or catheters. Other risk factors for falls in a hospital include:  Having difficulty with hearing or vision.  Having a change in thinking or behavior, such as confusion.  Having depression.  Having trouble with balance.  Being a male.  Feeling dizzy.  Needing to  use the toilet frequently.  Having fallen during the past three months.  Having low blood pressure. What are some strategies for preventing falls? If you or a loved one has to stay in the hospital:  Ask about which fall prevention strategies will be in place. Do not hesitate to speak up if you notice that the fall prevention plan has changed.  Ask for help moving around, especially after surgery or when feeling unwell.  If you have been asked to call for help when getting up, do not get up by yourself. Asking  for help with getting up is for your safety, and the staff is there to help you.  Wear nonskid footwear.  Get up slowly, and sit at the side of the bed for a few minutes before standing up.  Keep items you need, such as the nurse call button or a phone, close to you so that you do not need to reach for them.  Wear eyeglasses or hearing aids if you have them.  Have someone stay in the hospital with you or your loved one.  Ask if sleeping pills or other medicines that can cause confusion are necessary. What does the hospital staff do to help prevent falls? Hospitals have systems in place to prevent falls and accidents, which may involve:  Discussing your fall risks and making a personalized fall prevention plan.  Checking in regularly to see if you need help.  Placing an arm band on your wrist or a sign near your room to alert other staff of your needs.  Using an alarm on your hospital bed. This is an alarm that goes off if you get out of bed and forget to call for help.  Keeping the bed in a low and locked position.  Keeping the area around the bed and bathroom well-lit and free from clutter.  Keeping your room quiet, so that you can sleep and be well-rested.  Using safety equipment, such as: ? A belt around your waist. ? Walkers, crutches, and other devices for support. ? Safety beds, such as low beds or cushions on the floor next to the bed.  Having a staff person stay with you (one-on-one observation), even when you are using the bathroom. This is for your safety.  Using video monitoring. This allows a staff member to come to help you if you need help. What other actions can I take to lower my risk of falls?  Check in regularly with your health care provider or pharmacist to review all of the medicines that you take.  Make sure that you have a regular exercise program to stay fit. This will help you maintain your balance.  Talk with a physical therapist or trainer if  recommended by your health care provider. They can help you to improve your strength, balance, and endurance.  If you are over age 24: ? Ask your health care provider if you need a calcium or vitamin D supplement. ? Have your eyes and hearing checked every year. ? Have your feet checked every year. Where to find more information You can find more information about fall prevention from the Centers for Disease Control and Prevention: ImproveLook.cz Summary  Being in an unfamiliar environment, such as the hospital, increases your risk for falling.  If you have been asked to call for help when getting up, do not get up by yourself. Asking for help with getting up is for your safety, and the staff is there to help you.  Ask about which fall prevention strategies will be in place. Do not hesitate to speak up if you notice that the fall prevention plan has changed.  If you or a loved one falls, tell the hospital staff. This is important. This information is not intended to replace advice given to you by your health care provider. Make sure you discuss any questions you have with your health care provider. Document Revised: 10/02/2017 Document Reviewed: 06/03/2017 Elsevier Patient Education  2020 [employer]. Your workup in the Emergency Department today was generally reassuring.  We did not find any specific new abnormalities.  We recommend you drink plenty of fluids, take your regular medications and/or any new ones prescribed today, and follow up with the doctor(s) listed in these documents as recommended.  Try to cut back on the amount of alcohol you drink as this is likely causing most of your issues.  I recommend you follow-up with your primary care doctor or with specialists including gastroenterology and/or general surgery about your persistent abdominal issues.  I also provided some prescriptions for medications that may help with your discomfort.  Return to the Emergency Department if  you develop new or worsening symptoms that concern you.

## 2020-01-30 NOTE — Care Management Important Message (Signed)
Important Message  Patient Details  Name: Douglas Peterson. MRN: [id number] Date of Birth: 02-16-1951   Medicare Important Message Given:  Yes     [name] 01/30/2020, 10:43 AM

## 2020-01-30 NOTE — TOC Transition Note (Addendum)
Transition of Care [facility]) - CM/SW Discharge Note   Patient Details  Name: Douglas Peterson. MRN: [id number] Date of Birth: [birth year]  Transition of Care [facility]) CM/SW Contact:  Elease Hashimoto, LCSW Phone Number: 01/30/2020, 12:06 PM   Clinical Narrative:  [name] plan now is to discharge home doing well with PT and they feel can manage at home alone. [facility] and he has all needed equipment from previous admissions. He is pleased with this plan and reports his sister will come and transport him home. He has a PCP. No further follow due to DC home today.   OT and Sw added to Home health order   Final next level of care: [place] Barriers to Discharge: Barriers Resolved   Patient Goals and CMS Choice Patient states their goals for this hospitalization and ongoing recovery are:: unable to verbalize goals   Choice offered to / list presented to : Patient  Discharge Placement                Patient to be transferred to facility by: Sister via car Name of family member notified: Sister Patient and family notified of of transfer: 01/30/20  Discharge Plan and Services   Discharge Planning Services: CM Consult                      HH Arranged: PT [facility] Agency: [facility] Date [place]: 01/30/20 Time [place]: 1206 Representative spoke with at [place]: [place] ([place]) Interventions     Readmission Risk Interventions Readmission Risk Prevention Plan 12/13/2019 12/12/2019  Transportation Screening Complete Complete  PCP or Specialist Appt within 3-5 Days (No Data) -  HRI or [place] (No Data) -  Palliative Care Screening Complete Complete  Medication Review (RN Care Manager) Complete Complete  Some recent data might be hidden

## 2020-01-30 NOTE — Progress Notes (Signed)
Physical Therapy Treatment Patient Details Name: Douglas Peterson. MRN: [id number] DOB: 1951-08-13 Today's Date: 01/30/2020    History of Present Illness Douglas Peterson is a 23yoM who comes to [facility] on 3/16 from home c AMS and persistent nausea. Alcohol level 307 upon arrival. Pt typically independent with ADL at baseline. Drives, performs IADL independently, although MD has asked pt to stop driving. 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    PT Comments    Pt demonstrated improved functional mobility/safety this date. Up in chair upon arrival. Able to ambulate around RN station with [facility] with generalized unsteadiness, although no overt LOB. Educated about safety and falls risk. Good speed with 5 time sit<>stand demonstrating good strength. Still with balance concerns with higher level task including SLS. Pt agreeable to HHPT, updated recommendations. CSW notified.   Follow Up Recommendations  Home health PT     Equipment Recommendations  None recommended by PT    Recommendations for Other Services       Precautions / Restrictions Precautions Precautions: Fall Restrictions Weight Bearing Restrictions: No    Mobility  Bed Mobility               General bed mobility comments: not performed as pt received in chair  Transfers Overall transfer level: Modified independent Equipment used: Straight cane Transfers: Sit to/from Stand Sit to Stand: Modified independent (Device/Increase time)         General transfer comment: safe technique, uses SPC in L hand. Slight forward flexed posture  Ambulation/Gait Ambulation/Gait assistance: Min guard;Supervision Gait Distance (Feet): 200 Feet Assistive device: Straight cane Gait Pattern/deviations: Step-through pattern;Decreased step length - right;Decreased step length - left     General Gait Details: slow gait speed, although appears to be close to baseline. Pt able to perform head turns and carry  conversation with exertion. No LOB noted, just generalized unsteadiness.   Stairs             Wheelchair Mobility    Modified Rankin (Stroke Patients Only)       Balance Overall balance assessment: Needs assistance Sitting-balance support: Single extremity supported Sitting balance-Leahy Scale: Good     Standing balance support: Single extremity supported;During functional activity Standing balance-Leahy Scale: Good Standing balance comment: able to maintain static standing balance using SPC Single Leg Stance - Right Leg: 7 Single Leg Stance - Left Leg: 2           High Level Balance Comments: Has difficulty maintaining balance with R stance only.             Cognition Arousal/Alertness: Awake/alert Behavior During Therapy: WFL for tasks assessed/performed;Impulsive Overall Cognitive Status: Within Functional Limits for tasks assessed                                 General Comments: Pt is A&O x4 and not impulsive      Exercises Other Exercises Other Exercises: performed 5 time sit<>Stand in 10 seconds.    General Comments        Pertinent Vitals/Pain Pain Assessment: No/denies pain    Home Living                      Prior Function            PT Goals (current goals can now be found in the care plan section) Acute Rehab PT Goals Patient Stated  Goal: To go home PT Goal Formulation: With patient Time For Goal Achievement: 02/02/20 Potential to Achieve Goals: Good Progress towards PT goals: Progressing toward goals    Frequency    Min 2X/week      PT Plan Discharge plan needs to be updated    Co-evaluation              AM-PAC PT "6 Clicks" Mobility   Outcome Measure  Help needed turning from your back to your side while in a flat bed without using bedrails?: None Help needed moving from lying on your back to sitting on the side of a flat bed without using bedrails?: None Help needed moving to and  from a bed to a chair (including a wheelchair)?: None Help needed standing up from a chair using your arms (e.g., wheelchair or bedside chair)?: None Help needed to walk in hospital room?: None Help needed climbing 3-5 steps with a railing? : A Little 6 Click Score: 23    End of Session Equipment Utilized During Treatment: Gait belt Activity Tolerance: Patient tolerated treatment well Patient left: in chair;with call bell/phone within reach;with chair alarm set Nurse Communication: Mobility status PT Visit Diagnosis: Unsteadiness on feet (R26.81);Difficulty in walking, not elsewhere classified (R26.2);Other abnormalities of gait and mobility (R26.89)     Time: 1001-1015 PT Time Calculation (min) (ACUTE ONLY): 14 min  Charges:  $Gait Training: 8-22 mins                     Douglas Peterson, [place], DPT [phone]    , 01/30/2020, 10:39 AM

## 2020-01-31 ENCOUNTER — Encounter: Payer: Self-pay | Admitting: General Surgery

## 2020-01-31 ENCOUNTER — Telehealth: Payer: Self-pay

## 2020-01-31 ENCOUNTER — Ambulatory Visit (INDEPENDENT_AMBULATORY_CARE_PROVIDER_SITE_OTHER): Payer: Medicare HMO | Admitting: General Surgery

## 2020-01-31 ENCOUNTER — Other Ambulatory Visit: Payer: Self-pay

## 2020-01-31 VITALS — BP 123/78 | HR 63 | Temp 97.2°F | Ht 64.0 in | Wt 116.4 lb

## 2020-01-31 DIAGNOSIS — K4090 Unilateral inguinal hernia, without obstruction or gangrene, not specified as recurrent: Secondary | ICD-10-CM | POA: Diagnosis not present
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
## 2020-01-31 NOTE — Telephone Encounter (Signed)
Medical Clearance was faxed over to [facility] Thies's office at [facility] at [phone].

## 2020-01-31 NOTE — Patient Instructions (Addendum)
Our surgery scheduler Marzetta Board will contact you within the next 24-48 hrs. During that call, Marzetta Board will discuss the preparation prior to surgery. She will also discuss the different the days and times. Please have the BLUE sheet available when she contacts you.   Dr.Cannon advised patient he will need to stop drinking and smoking prior to surgery. Also, advised patient that after surgery he would not be able to lift anything heavier than 10 pounds or more for a total of 6 weeks. [name] also advised to try over the counter Stool Softeners or Miralax to prevent constipation.   Inguinal Hernia, Adult An inguinal hernia develops when fat or the intestines push through a weak spot in a muscle where your leg meets your lower abdomen (groin). This creates a bulge. This kind of hernia could also be:  In your scrotum, if you are male.  In folds of skin around your vagina, if you are male. There are three types of inguinal hernias:  Hernias that can be pushed back into the abdomen (are reducible). This type rarely causes pain.  Hernias that are not reducible (are incarcerated).  Hernias that are not reducible and lose their blood supply (are strangulated). This type of hernia requires emergency surgery. What are the causes? This condition is caused by having a weak spot in the muscles or tissues in the groin. This weak spot develops over time. The hernia may poke through the weak spot when you suddenly strain your lower abdominal muscles, such as when you:  Lift a heavy object.  Strain to have a bowel movement. Constipation can lead to straining.  Cough. What increases the risk? This condition is more likely to develop in:  Men.  Pregnant women.  People who: ? Are overweight. ? Work in jobs that require long periods of standing or heavy lifting. ? Have had an inguinal hernia before. ? Smoke or have lung disease. These factors can lead to long-lasting (chronic) coughing. What are the  signs or symptoms? Symptoms may depend on the size of the hernia. Often, a small inguinal hernia has no symptoms. Symptoms of a larger hernia may include:  A lump in the groin area. This is easier to see when standing. It might not be visible when lying down.  Pain or burning in the groin. This may get worse when lifting, straining, or coughing.  A dull ache or a feeling of pressure in the groin.  In men, an unusual lump in the scrotum. Symptoms of a strangulated inguinal hernia may include:  A bulge in your groin that is very painful and tender to the touch.  A bulge that turns red or purple.  Fever, nausea, and vomiting.  Inability to have a bowel movement or to pass gas. How is this diagnosed? This condition is diagnosed based on your symptoms, your medical history, and a physical exam. Your health care provider may feel your groin area and ask you to cough. How is this treated? Treatment depends on the size of your hernia and whether you have symptoms. If you do not have symptoms, your health care provider may have you watch your hernia carefully and have you come in for follow-up visits. If your hernia is large or if you have symptoms, you may need surgery to repair the hernia. Follow these instructions at home: Lifestyle  Avoid lifting heavy objects.  Avoid standing for long periods of time.  Do not use any products that contain nicotine or tobacco, such as cigarettes  and e-cigarettes. If you need help quitting, ask your health care provider.  Maintain a healthy weight. Preventing constipation  Take actions to prevent constipation. Constipation leads to straining with bowel movements, which can make a hernia worse or cause a hernia repair to break down. Your health care provider may recommend that you: ? Drink enough fluid to keep your urine pale yellow. ? Eat foods that are high in fiber, such as fresh fruits and vegetables, whole grains, and beans. ? Limit foods that are  high in fat and processed sugars, such as fried or sweet foods. ? Take an over-the-counter or prescription medicine for constipation. General instructions  You may try to push the hernia back in place by very gently pressing on it while lying down. Do not try to force the bulge back in if it will not push in easily.  Watch your hernia for any changes in shape, size, or color. Get help right away if you notice any changes.  Take over-the-counter and prescription medicines only as told by your health care provider.  Keep all follow-up visits as told by your health care provider. This is important. Contact a health care provider if:  You have a fever.  You develop new symptoms.  Your symptoms get worse. Get help right away if:  You have pain in your groin that suddenly gets worse.  You have a bulge in your groin that: ? Suddenly gets bigger and does not get smaller. ? Becomes red or purple or painful to the touch.  You are a man and you have a sudden pain in your scrotum, or the size of your scrotum suddenly changes.  You cannot push the hernia back in place by very gently pressing on it when you are lying down. Do not try to force the bulge back in if it will not push in easily.  You have nausea or vomiting that does not go away.  You have a fast heartbeat.  You cannot have a bowel movement or pass gas. These symptoms may represent a serious problem that is an emergency. Do not wait to see if the symptoms will go away. Get medical help right away. Call your local emergency services (911 in the [place] Summary  An inguinal hernia develops when fat or the intestines push through a weak spot in a muscle where your leg meets your lower abdomen (groin).  This condition is caused by having a weak spot in muscles or tissue in your groin.  Symptoms may depend on the size of the hernia, and they may include pain or swelling in your groin. A small inguinal hernia often has no  symptoms.  Treatment may not be needed if you do not have symptoms. If you have symptoms or a large hernia, you may need surgery to repair the hernia.  Avoid lifting heavy objects. Also avoid standing for long amounts of time. This information is not intended to replace advice given to you by your health care provider. Make sure you discuss any questions you have with your health care provider. Document Revised: 11/21/2017 Document Reviewed: 07/22/2017 Elsevier Patient Education  [employer].

## 2020-02-01 NOTE — Discharge Summary (Signed)
[facility] Discharge Summary   Patient: Douglas Peterson. SKA:[id number]  PCP: [name], MD  Date of admission: 01/17/2020   Date of discharge: 01/30/2020      Discharge Diagnoses:   Principal Problem:   Acute metabolic encephalopathy Active Problems:   Alcohol use   HTN (hypertension)   Malnutrition of moderate degree   Alcohol intoxication, uncomplicated (HCC)   Alcohol abuse   Alcoholic cirrhosis of liver without ascites (HCC)   Thrombocytopenia (HCC)   GERD (gastroesophageal reflux disease)   Depression   Encounter for competency evaluation   Admitted From: home Disposition:  Home   Recommendations for Outpatient Follow-up:  1. PCP: please follow up with PCP in 1 week 2. Follow up LABS/TEST:  none   Contact information for follow-up providers    Lin Landsman, MD. Go on 03/20/2020.   Specialty: Gastroenterology Why: @1 :45 PM  Contact information: [place] [address] [phone]        Fredirick Maudlin, MD. Go on 01/31/2020.   Specialty: General Surgery Why: @2 :30 PM   Contact information: [place] [address] [phone]        [name], MD. Go on 02/06/2020.   Specialty: Internal Medicine Why: @2 :00 PM  Contact information: [place] [address] [phone]            Contact information for after-discharge care    Destination    [facility] .   Service: Skilled Nursing Contact information: [address] [place] [phone]                 Diet recommendation: Cardiac diet  Activity: The patient is advised to gradually reintroduce usual activities, as tolerated  Discharge Condition: stable  Code Status: Full code   History of present illness: As per the H and P dictated on admission, "Douglas Peterson. is a 69 y.o. male with medical history significant of hypertension, GERD,  alcohol abuse, alcoholic liver cirrhosis without ascites, thrombocytopenia, depression, TBI with right sided weakness, who presents with altered mental status and persistent nausea.  Per his sister (I called her sister by phone), pt having persistent severe nausea and dry heaves since yesterday.  He also has had some epigastric abdominal pain.  No diarrhea.  Patient continues to drink alcohol, did not eat well for more than a week.  He has some mild loose stool bowel movement sometimes.  No chest pain, shortness breath.  He has mild dry cough.  No fever or chills.  No symptoms of UTI. Per EDP, pt initially confused at arrival. His mental status has improved to the ED. When I saw pt in ED, he is drowsy, but he is orientated x3.  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  "  Hospital Course:  Summary of his active problems in the hospital is as following.   1.  Acute metabolic encephalopathy resolved Currently back to baseline. CT head negative. Treated with vitamin B1.  2.  Alcohol use Alcohol intoxication CIWA protocol was initiated. Patient currently out of the withdrawal.. Continue vitamin B-1.  3.  Essential hypertension Blood pressure stable. Continue atenolol  4.  Hepatic steatosis.  No cirrhosis seen on the CT scan. Currently no evidence of decompensation.  5.  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 No evidence of active bleeding. Monitor.  6.  GERD Esophagitis Dysphagia Continue Protonix. Currently able to tolerate oral diet without any problem.  7.  History of depression. Currently stable no suicidal or homicidal ideation. Continue home regimen.  8.  Constipation. Currently resolved. Continue nightly Senokot.  9.  Proctitis. Seen on CT  Currently no complaint.  10 BPH. Currently again no symptoms.  11.  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 No  point tenderness no pain. SP vertebroplasty of L2. Also chronic L1 compression deformity. This is secondary to patient's recurrent falls.  12. Recurrent fall Physical deconditioning PT working,  Patient was seen by physical therapy, who recommended Home health, which was arranged. On the day of the discharge the patient's vitals were stable, and no other acute medical condition were reported by patient. the patient was felt safe to be discharge at Home with Home health.  Consultants: psychiatry Procedures: none  Discharge Exam: General: Appear in no distress, no Rash; Oral Mucosa Clear, moist. Cardiovascular: S1 and S2 Present, no Murmur, Respiratory: normal respiratory effort, Bilateral Air entry present and no Crackles, no wheezes Abdomen: Bowel Sound present, Soft and no tenderness, no hernia Extremities: no Pedal edema, no calf tenderness Neurology: alert and oriented to time, place, and person affect appropriate.  Filed Weights   01/17/20 1400  Weight: 53 kg   Vitals:   01/29/20 2148 01/30/20 0732  BP: 120/79 121/76  Pulse: (!) 55 (!) 52  Resp:  16  Temp:  98.1 F (36.7 C)  SpO2:  96%    DISCHARGE MEDICATION: Allergies as of 01/30/2020   No Known Allergies     Medication List    STOP taking these medications   famotidine 20 MG tablet Commonly known as: PEPCID     TAKE these medications   acetaminophen 325 MG tablet Commonly known as: TYLENOL Take 2 tablets (650 mg total) by mouth every 6 (six) hours as needed for mild pain (or Fever >/= 101).   atenolol 25 MG tablet Commonly known as: TENORMIN Take 25 mg by mouth 2 (two) times daily.   baclofen 10 MG tablet Commonly known as: LIORESAL Take 10 mg by mouth 3 (three) times daily.   feeding supplement (ENSURE ENLIVE) Liqd Take 237 mLs by mouth 2 (two) times daily between meals.   folic acid 1 MG tablet Commonly known as: FOLVITE Take 1 tablet (1 mg total) by mouth daily.   multivitamin with  minerals Tabs tablet Take 1 tablet by mouth daily.   omeprazole 20 MG tablet Commonly known as: PriLOSEC OTC Take 1 tablet (20 mg total) by mouth daily.   ondansetron 4 MG disintegrating tablet Commonly known as: Zofran ODT Allow 1-2 tablets to dissolve in your mouth every 8 hours as needed for nausea/vomiting What changed:   how much to take  how to take this  when to take this  reasons to take this  additional instructions   PARoxetine 30 MG tablet Commonly known as: PAXIL Take 30 mg by mouth daily.   senna-docusate 8.6-50 MG tablet Commonly known as: Senokot-S Take 2 tablets by mouth 2 (two) times daily.   sucralfate 1 g tablet Commonly known as: Carafate Take 1 tablet (1 g total) by mouth 4 (four) times daily as needed (for abdominal discomfort, nausea, and/or vomiting).   thiamine 100 MG tablet Take 1 tablet (100 mg total) by mouth daily.      No Known Allergies Discharge Instructions    Discharge instructions   Complete by: As directed    It is  important that you read the given instructions as well as go over your medication list with RN to help you understand your care after this hospitalization.  Please follow-up with PCP in 1-2 weeks.  Please note that NO REFILLS for any discharge medications will be authorized once you are discharged, as it is imperative that you return to your primary care physician (or establish a relationship with a primary care physician if you do not have one) for your aftercare needs so that they can reassess your need for medications and monitor your lab values.  Please request your primary care physician to go over all Hospital Tests and Procedure/Radiological results at the follow up. Please get all Hospital records sent to your PCP by signing hospital release before you go home.   Do not drive, operating heavy machinery, perform activities at heights, swimming or participation in water activities or provide baby sitting services;  until you have been seen by Primary Care Physician or a Neurologist and are cleared to do such activities.  Do not take more than prescribed Pain, Sleep and Anxiety Medications.  You were cared for by a hospitalist during your hospital stay. If you have any questions about your discharge medications or the care you received while you were in the hospital after you are discharged, you can call the unit @UNIT @ you were admitted to and ask to speak with the hospitalist Berle Mull. Ask for Hospitalist on call if the hospitalist that took care of you is not available.   Once you are discharged, your primary care physician will handle any further medical issues.  You Must read complete instructions/literature along with all the possible adverse reactions/side effects for all the Medicines you take and that have been prescribed to you. Take any new Medicines after you have completely understood and accept all the possible adverse reactions/side effects.  If you have smoked or chewed Tobacco in the last 2 yrs please STOP smoking If you drink alcohol, please safely STOP the use. Do not drive, operating heavy machinery, perform activities at heights, swimming or participation in water activities or provide baby sitting services under influence.  Wear Seat belts while driving.   Increase activity slowly   Complete by: As directed    Increase activity slowly   Complete by: As directed       The results of significant diagnostics from this hospitalization (including imaging, microbiology, ancillary and laboratory) are listed below for reference.    Significant Diagnostic Studies: DG Chest 2 View  Result Date: 01/17/2020 CLINICAL DATA:  Weakness EXAM: CHEST - 2 VIEW COMPARISON:  12/08/2019 FINDINGS: Mild elevation of the right hemidiaphragm. Low lung volumes. Right base atelectasis. Heart is normal size. Left lung clear. No effusions or acute bony abnormality. IMPRESSION: Low lung volumes. Stable mild  elevation of the right hemidiaphragm with right base atelectasis. Electronically Signed   By: Rolm Baptise M.D.   On: 01/17/2020 09:53   DG Abdomen 1 View  Result Date: 01/03/2020 CLINICAL DATA:  Abdominal pain. Alcohol abuse. Constipation. EXAM: ABDOMEN - 1 VIEW COMPARISON:  06/12/2019 abdominal radiograph FINDINGS: Large amount of colonic gas throughout the large bowel. No disproportionately dilated small bowel loops. No evidence of pneumatosis or pneumoperitoneum. Clear lung bases. No radiopaque nephrolithiasis. Vertebroplasty material again noted within upper lumbar vertebral body. Surgical sutures in the right lower quadrant. IMPRESSION: Large amount of colonic gas.  Nonobstructive bowel gas pattern. Electronically Signed   By: Ilona Sorrel M.D.   On: 01/03/2020 12:34  CT Head Wo Contrast  Result Date: 01/17/2020 CLINICAL DATA:  Neuro deficit, subacute. EXAM: CT HEAD WITHOUT CONTRAST TECHNIQUE: Contiguous axial images were obtained from the base of the skull through the vertex without intravenous contrast. COMPARISON:  Head CT examinations 12/06/2019 and earlier FINDINGS: Brain: There is no evidence of acute intracranial hemorrhage, intracranial mass, midline shift or extra-axial fluid collection.No demarcated cortical infarction. Stable, mild generalized parenchymal atrophy and chronic small vessel ischemic disease. Vascular: No hyperdense vessel. Skull: Normal. Negative for fracture or focal lesion. Sinuses/Orbits: Visualized orbits demonstrate no acute abnormality. No significant paranasal sinus disease or mastoid effusion at the imaged levels. IMPRESSION: No evidence of acute intracranial abnormality. Stable, mild generalized parenchymal atrophy and chronic small vessel ischemic disease. Electronically Signed   By: Kellie Simmering DO   On: 01/17/2020 09:55   CT ABDOMEN PELVIS W CONTRAST  Result Date: 01/17/2020 CLINICAL DATA:  Nausea for 2-3 days, recent ETOH EXAM: CT ABDOMEN AND PELVIS WITH  CONTRAST TECHNIQUE: Multidetector CT imaging of the abdomen and pelvis was performed using the standard protocol following bolus administration of intravenous contrast. CONTRAST:  41m OMNIPAQUE IOHEXOL 300 MG/ML  SOLN COMPARISON:  Right upper quadrant ultrasound 08/14/2019 acute abdominal series 06/12/2019, abdominal radiograph 01/03/2020 FINDINGS: Lower chest: Atelectatic changes present in the lung bases, right slightly greater than left. Normal heart size. No pericardial effusion. Hepatobiliary: Diffuse hepatic hypoattenuation compatible with hepatic steatosis. Geographic regions of sparing are noted along the falciform ligament and gallbladder fossa. No focal worrisome liver lesions. Smooth hepatic surface contour. The gallbladder appears moderately distended. No visible calcified gallstones within the gallbladder lumen or biliary tree. No biliary ductal dilatation. Pancreas: Unremarkable. No pancreatic ductal dilatation or surrounding inflammatory changes. Spleen: Normal in size without focal abnormality. Adrenals/Urinary Tract: Normal adrenal glands. Bilateral extrarenal pelves with slight pelviectasis and ureterectasis to the level of the urinary bladder without obstructing calculus. No visible or contour deforming renal lesions. Kidneys enhance and excrete symmetrically. Urinary bladder is unremarkable aside from indentation of the bladder base by the enlarged prostate. Stomach/Bowel: Mild circumferential distal esophageal thickening without adjacent free fluid or gas. Stomach is unremarkable. Duodenal sweep is normal with in expected course across the midline abdomen. Proximal colon is unremarkable. Portion of the proximal sigmoid colon protrudes into a left fat and bowel containing inguinal hernia. No resulting evidence of mechanical obstruction or vascular compromise of the herniated bowel loop. Distal sigmoid has a more normal appearance. There is some slightly asymmetric thickening of the rectum more  pronounced from the 3:00 to 6:00 positions. Vascular/Lymphatic: Atherosclerotic plaque within the normal caliber aorta. Major venous structures normally opacified. Hepatic and portal veins appear patent. No suspicious or enlarged lymph nodes in the included lymphatic chains. Reproductive: Mild prostatomegaly. Other: Fat and bowel containing left inguinal hernia containing portion of the proximal sigmoid colon without evidence of mechanical obstruction or vascular compromise. No other bowel containing hernia is seen. Mild bilateral gynecomastia. Musculoskeletal: Multilevel discogenic and facet degenerative changes in the spine including a marked anterior wedge compression deformity of L1 with approximately 70% height loss anteriorly similar to comparison. Prior vertebroplasty changes at the L2 vertebral body noted as well. Superior endplate Schmorl's node and likely subacute to chronic compression deformity at the T12 superior endplate as well. Certainly more pronounced on comparison radiographs from 2020. No other acute or suspicious osseous lesion. IMPRESSION: 1. Fat and bowel containing left inguinal hernia containing a portion of the proximal sigmoid colon without evidence of mechanical obstruction or vascular compromise. 2.  Mild circumferential distal esophageal thickening without adjacent free fluid or gas. This finding is nonspecific but can be seen in the setting of esophagitis. 3. Slight asymmetric thickening of the rectum more pronounced from the 3:00 to 6:00 positions. Finding could reflect a mild proctitis though should be correlated with direct inspection on an outpatient basis. 4. Hepatic steatosis. 5. Mild urinary tract dilatation without obstructing calculi. Indentation of the bladder base by a more line enlarged prostate could reflect some outlet obstruction. Correlate with symptoms. 6. Suspect a subacute or likely chronic compression deformity of T12 with approximately 10% height loss anteriorly  though could correlate for point tenderness to assess for acuity. Grossly stable appearance of an L1 compression deformity. Post vertebroplasty changes of L2. 7. Aortic Atherosclerosis (ICD10-I70.0). Electronically Signed   By: Lovena Le M.D.   On: 01/17/2020 05:45    Microbiology: No results found for this or any previous visit (from the past 240 hour(s)).   Labs: CBC: Recent Labs  Lab 01/26/20 0437 01/28/20 1003  WBC 5.6 5.3  NEUTROABS 2.1 1.9  HGB 14.9 16.6  HCT 44.5 50.0  MCV 94.3 94.7  PLT 221 354   Basic Metabolic Panel: Recent Labs  Lab 01/28/20 1003  NA 138  K 3.5  CL 103  CO2 26  GLUCOSE 115*  BUN 12  CREATININE 0.56*  CALCIUM 9.2  MG 2.0   Liver Function Tests: Recent Labs  Lab 01/28/20 1003  AST 37  ALT 40  ALKPHOS 71  BILITOT 0.8  PROT 7.5  ALBUMIN 4.2   No results for input(s): LIPASE, AMYLASE in the last 168 hours. No results for input(s): AMMONIA in the last 168 hours. Cardiac Enzymes: No results for input(s): CKTOTAL, CKMB, CKMBINDEX, TROPONINI in the last 168 hours. BNP (last 3 results) Recent Labs    01/18/20 1056  BNP 185.0*   CBG: Recent Labs  Lab 01/26/20 0813 01/27/20 0754 01/27/20 1129 01/28/20 0916 01/29/20 0758  GLUCAP 87 84 106* 103* 79    Time spent: 35 minutes  Signed:  Berle Mull  [facility] 01/30/2020 7:46 AM

## 2020-02-06 DIAGNOSIS — F5101 Primary insomnia: Secondary | ICD-10-CM | POA: Insufficient documentation

## 2020-02-20 ENCOUNTER — Telehealth: Payer: Self-pay

## 2020-02-20 NOTE — Telephone Encounter (Signed)
Call to Dr Pierce Crane office checking on the status of his medical clearance. This was faxed over on 01/31/20 and re faxed over on 02/16/20. They have sent a message to the nurse regarding this.

## 2020-02-23 ENCOUNTER — Emergency Department: Payer: Medicare HMO

## 2020-02-23 ENCOUNTER — Other Ambulatory Visit: Payer: Self-pay

## 2020-02-23 ENCOUNTER — Inpatient Hospital Stay
Admission: EM | Admit: 2020-02-23 | Discharge: 2020-03-01 | DRG: 640 | Disposition: A | Discharge status: Skilled Nursing Facility | Payer: Medicare HMO | Attending: Internal Medicine | Admitting: Internal Medicine

## 2020-02-23 DIAGNOSIS — F10130 Alcohol abuse with withdrawal, uncomplicated: Secondary | ICD-10-CM | POA: Diagnosis present

## 2020-02-23 DIAGNOSIS — Z981 Arthrodesis status: Secondary | ICD-10-CM

## 2020-02-23 DIAGNOSIS — F1010 Alcohol abuse, uncomplicated: Secondary | ICD-10-CM

## 2020-02-23 DIAGNOSIS — W19XXXA Unspecified fall, initial encounter: Secondary | ICD-10-CM | POA: Diagnosis present

## 2020-02-23 DIAGNOSIS — E43 Unspecified severe protein-calorie malnutrition: Secondary | ICD-10-CM | POA: Diagnosis present

## 2020-02-23 DIAGNOSIS — Z20822 Contact with and (suspected) exposure to covid-19: Secondary | ICD-10-CM | POA: Diagnosis present

## 2020-02-23 DIAGNOSIS — I10 Essential (primary) hypertension: Secondary | ICD-10-CM | POA: Diagnosis present

## 2020-02-23 DIAGNOSIS — K709 Alcoholic liver disease, unspecified: Secondary | ICD-10-CM | POA: Diagnosis present

## 2020-02-23 DIAGNOSIS — M25511 Pain in right shoulder: Secondary | ICD-10-CM | POA: Diagnosis present

## 2020-02-23 DIAGNOSIS — K4090 Unilateral inguinal hernia, without obstruction or gangrene, not specified as recurrent: Secondary | ICD-10-CM

## 2020-02-23 DIAGNOSIS — E871 Hypo-osmolality and hyponatremia: Secondary | ICD-10-CM | POA: Diagnosis present

## 2020-02-23 DIAGNOSIS — M5030 Other cervical disc degeneration, unspecified cervical region: Secondary | ICD-10-CM | POA: Diagnosis present

## 2020-02-23 DIAGNOSIS — E878 Other disorders of electrolyte and fluid balance, not elsewhere classified: Secondary | ICD-10-CM | POA: Diagnosis present

## 2020-02-23 DIAGNOSIS — Z87820 Personal history of traumatic brain injury: Secondary | ICD-10-CM | POA: Diagnosis not present

## 2020-02-23 DIAGNOSIS — I480 Paroxysmal atrial fibrillation: Secondary | ICD-10-CM | POA: Diagnosis present

## 2020-02-23 DIAGNOSIS — F329 Major depressive disorder, single episode, unspecified: Secondary | ICD-10-CM | POA: Diagnosis present

## 2020-02-23 DIAGNOSIS — Z79899 Other long term (current) drug therapy: Secondary | ICD-10-CM

## 2020-02-23 DIAGNOSIS — Z515 Encounter for palliative care: Secondary | ICD-10-CM | POA: Diagnosis present

## 2020-02-23 DIAGNOSIS — Z8249 Family history of ischemic heart disease and other diseases of the circulatory system: Secondary | ICD-10-CM

## 2020-02-23 DIAGNOSIS — R7401 Elevation of levels of liver transaminase levels: Secondary | ICD-10-CM

## 2020-02-23 DIAGNOSIS — K219 Gastro-esophageal reflux disease without esophagitis: Secondary | ICD-10-CM | POA: Diagnosis present

## 2020-02-23 DIAGNOSIS — R531 Weakness: Secondary | ICD-10-CM

## 2020-02-23 DIAGNOSIS — Z87891 Personal history of nicotine dependence: Secondary | ICD-10-CM

## 2020-02-23 DIAGNOSIS — Y907 Blood alcohol level of 200-239 mg/100 ml: Secondary | ICD-10-CM | POA: Diagnosis present

## 2020-02-23 DIAGNOSIS — G9341 Metabolic encephalopathy: Secondary | ICD-10-CM | POA: Diagnosis present

## 2020-02-23 DIAGNOSIS — E876 Hypokalemia: Secondary | ICD-10-CM | POA: Diagnosis not present

## 2020-02-23 DIAGNOSIS — R296 Repeated falls: Secondary | ICD-10-CM | POA: Diagnosis present

## 2020-02-23 DIAGNOSIS — K760 Fatty (change of) liver, not elsewhere classified: Secondary | ICD-10-CM | POA: Diagnosis present

## 2020-02-23 DIAGNOSIS — E860 Dehydration: Secondary | ICD-10-CM | POA: Diagnosis present

## 2020-02-23 DIAGNOSIS — Z6820 Body mass index (BMI) 20.0-20.9, adult: Secondary | ICD-10-CM

## 2020-02-23 DIAGNOSIS — D696 Thrombocytopenia, unspecified: Secondary | ICD-10-CM | POA: Diagnosis present

## 2020-02-23 LAB — COMPREHENSIVE METABOLIC PANEL
ALT: 37 U/L (ref 0–44)
AST: 64 U/L — ABNORMAL HIGH (ref 15–41)
Albumin: 4.2 g/dL (ref 3.5–5.0)
Alkaline Phosphatase: 99 U/L (ref 38–126)
Anion gap: 15 (ref 5–15)
BUN: <5 mg/dL — ABNORMAL LOW (ref 8–23)
CO2: 26 mmol/L (ref 22–32)
Calcium: 8.2 mg/dL — ABNORMAL LOW (ref 8.9–10.3)
Chloride: 88 mmol/L — ABNORMAL LOW (ref 98–111)
Creatinine, Ser: 0.39 mg/dL — ABNORMAL LOW (ref 0.61–1.24)
GFR calc Af Amer: >60 mL/min (ref >60)
GFR calc non Af Amer: >60 mL/min (ref >60)
Glucose, Bld: 109 mg/dL — ABNORMAL HIGH (ref 70–99)
Potassium: 3.9 mmol/L (ref 3.5–5.1)
Sodium: 129 mmol/L — ABNORMAL LOW (ref 135–145)
Total Bilirubin: 1 mg/dL (ref 0.3–1.2)
Total Protein: 7.2 g/dL (ref 6.5–8.1)

## 2020-02-23 LAB — CBC WITH DIFFERENTIAL/PLATELET
Abs Immature Granulocytes: 0.02 10*3/uL (ref 0.00–0.07)
Basophils Absolute: 0.1 10*3/uL (ref 0.0–0.1)
Basophils Relative: 1 %
Eosinophils Absolute: 0 10*3/uL (ref 0.0–0.5)
Eosinophils Relative: 1 %
HCT: 42.6 % (ref 39.0–52.0)
Hemoglobin: 14.9 g/dL (ref 13.0–17.0)
Immature Granulocytes: 0 %
Lymphocytes Relative: 61 %
Lymphs Abs: 5.4 10*3/uL — ABNORMAL HIGH (ref 0.7–4.0)
MCH: 31.2 pg (ref 26.0–34.0)
MCHC: 35 g/dL (ref 30.0–36.0)
MCV: 89.1 fL (ref 80.0–100.0)
Monocytes Absolute: 0.5 10*3/uL (ref 0.1–1.0)
Monocytes Relative: 5 %
Neutro Abs: 2.9 10*3/uL (ref 1.7–7.7)
Neutrophils Relative %: 32 %
Platelets: 73 10*3/uL — ABNORMAL LOW (ref 150–400)
RBC: 4.78 MIL/uL (ref 4.22–5.81)
RDW: 12.6 % (ref 11.5–15.5)
WBC: 8.8 10*3/uL (ref 4.0–10.5)
nRBC: 0 % (ref 0.0–0.2)

## 2020-02-23 LAB — OSMOLALITY: Osmolality: 364 mOsm/kg (ref 275–295)

## 2020-02-23 MED ORDER — THIAMINE HCL 100 MG PO TABS
100.0000 mg | ORAL_TABLET | Freq: Every day | ORAL | Status: DC
  Administered 2020-02-25 – 2020-03-01 (×6): 100 mg via ORAL
  Filled 2020-02-23 (×6): qty 1

## 2020-02-23 MED ORDER — ADULT MULTIVITAMIN W/MINERALS CH
1.0000 | ORAL_TABLET | Freq: Every day | ORAL | Status: DC
  Administered 2020-02-25 – 2020-03-01 (×6): 1 via ORAL
  Filled 2020-02-23 (×6): qty 1

## 2020-02-23 MED ORDER — GABAPENTIN 300 MG PO CAPS: 300.0000 mg | ORAL_CAPSULE | ORAL | Status: AC

## 2020-02-23 MED ORDER — LABETALOL HCL 5 MG/ML IV SOLN
20.0000 mg | INTRAVENOUS | Status: DC | PRN
  Administered 2020-02-25: 20 mg via INTRAVENOUS
  Filled 2020-02-23: qty 4

## 2020-02-23 MED ORDER — FOLIC ACID 1 MG PO TABS: 1.0000 mg | ORAL_TABLET | Freq: Every day | ORAL | Status: DC

## 2020-02-23 MED ORDER — SODIUM CHLORIDE 0.9 % IV BOLUS
1000.0000 mL | Freq: Once | INTRAVENOUS | Status: AC
  Administered 2020-02-23: 1000 mL via INTRAVENOUS

## 2020-02-23 MED ORDER — THIAMINE HCL 100 MG/ML IJ SOLN
100.0000 mg | Freq: Once | INTRAMUSCULAR | Status: AC
  Administered 2020-02-24: 100 mg via INTRAVENOUS
  Filled 2020-02-23: qty 2

## 2020-02-23 MED ORDER — FOLIC ACID 5 MG/ML IJ SOLN
1.0000 mg | Freq: Every day | INTRAMUSCULAR | Status: DC
  Administered 2020-02-24 (×2): 1 mg via INTRAVENOUS
  Filled 2020-02-23 (×3): qty 0.2

## 2020-02-23 MED ORDER — ONDANSETRON HCL 4 MG/2ML IJ SOLN
4.0000 mg | Freq: Four times a day (QID) | INTRAMUSCULAR | Status: DC | PRN
  Administered 2020-02-23 – 2020-02-24 (×2): 4 mg via INTRAVENOUS
  Filled 2020-02-23 (×2): qty 2

## 2020-02-23 MED ORDER — PAROXETINE HCL 30 MG PO TABS
30.0000 mg | ORAL_TABLET | Freq: Every day | ORAL | Status: DC
  Administered 2020-02-25 – 2020-03-01 (×6): 30 mg via ORAL
  Filled 2020-02-23 (×7): qty 1

## 2020-02-23 MED ORDER — CEFAZOLIN SODIUM-DEXTROSE 2-4 GM/100ML-% IV SOLN
2.0000 g | INTRAVENOUS | Status: DC
  Filled 2020-02-23: qty 100

## 2020-02-23 MED ORDER — LORAZEPAM 2 MG/ML IJ SOLN
1.0000 mg | INTRAMUSCULAR | Status: DC | PRN
  Administered 2020-02-24 – 2020-02-25 (×4): 1 mg via INTRAVENOUS
  Filled 2020-02-23 (×4): qty 1

## 2020-02-23 MED ORDER — ENSURE ENLIVE PO LIQD: 237.0000 mL | Freq: Two times a day (BID) | ORAL | Status: DC

## 2020-02-23 MED ORDER — SODIUM CHLORIDE 0.9 % IV SOLN: INTRAVENOUS | Status: DC

## 2020-02-23 MED ORDER — ACETAMINOPHEN 325 MG PO TABS: 650.0000 mg | ORAL_TABLET | Freq: Four times a day (QID) | ORAL | Status: DC | PRN

## 2020-02-23 MED ORDER — SENNOSIDES-DOCUSATE SODIUM 8.6-50 MG PO TABS
2.0000 | ORAL_TABLET | Freq: Two times a day (BID) | ORAL | Status: DC
  Administered 2020-02-25 – 2020-03-01 (×10): 2 via ORAL
  Filled 2020-02-23 (×11): qty 2

## 2020-02-23 MED ORDER — TRAZODONE HCL 50 MG PO TABS
25.0000 mg | ORAL_TABLET | Freq: Every evening | ORAL | Status: DC | PRN
  Administered 2020-02-25 – 2020-02-27 (×2): 25 mg via ORAL
  Filled 2020-02-23 (×2): qty 1

## 2020-02-23 MED ORDER — OMEPRAZOLE MAGNESIUM 20 MG PO TBEC: 20.0000 mg | DELAYED_RELEASE_TABLET | Freq: Every day | ORAL | Status: DC

## 2020-02-23 MED ORDER — ACETAMINOPHEN 650 MG RE SUPP: 650.0000 mg | Freq: Four times a day (QID) | RECTAL | Status: DC | PRN

## 2020-02-23 MED ORDER — SUCRALFATE 1 G PO TABS
1.0000 g | ORAL_TABLET | Freq: Four times a day (QID) | ORAL | Status: DC | PRN
  Administered 2020-02-28 (×2): 1 g via ORAL
  Filled 2020-02-23 (×2): qty 1

## 2020-02-23 MED ORDER — BUPIVACAINE LIPOSOME 1.3 % IJ SUSP
20.0000 mL | Freq: Once | INTRAMUSCULAR | Status: DC
  Filled 2020-02-23: qty 20

## 2020-02-23 MED ORDER — CELECOXIB 200 MG PO CAPS: 200.0000 mg | ORAL_CAPSULE | ORAL | Status: DC

## 2020-02-23 MED ORDER — BACLOFEN 10 MG PO TABS
10.0000 mg | ORAL_TABLET | Freq: Three times a day (TID) | ORAL | Status: DC
  Administered 2020-02-24 – 2020-03-01 (×18): 10 mg via ORAL
  Filled 2020-02-23 (×22): qty 1

## 2020-02-23 MED ORDER — CHLORHEXIDINE GLUCONATE CLOTH 2 % EX PADS
6.0000 | MEDICATED_PAD | Freq: Once | CUTANEOUS | Status: AC
  Administered 2020-02-24: 6 via TOPICAL

## 2020-02-23 MED ORDER — ATENOLOL 25 MG PO TABS
25.0000 mg | ORAL_TABLET | Freq: Two times a day (BID) | ORAL | Status: DC
  Administered 2020-02-24 – 2020-03-01 (×13): 25 mg via ORAL
  Filled 2020-02-23 (×14): qty 1

## 2020-02-23 MED ORDER — ACETAMINOPHEN 500 MG PO TABS: 1000.0000 mg | ORAL_TABLET | ORAL | Status: AC

## 2020-02-23 MED ORDER — PANTOPRAZOLE SODIUM 20 MG PO TBEC
20.0000 mg | DELAYED_RELEASE_TABLET | Freq: Every day | ORAL | Status: DC
  Administered 2020-02-25 – 2020-03-01 (×6): 20 mg via ORAL
  Filled 2020-02-23 (×7): qty 1

## 2020-02-23 MED ORDER — ONDANSETRON HCL 4 MG PO TABS: 4.0000 mg | ORAL_TABLET | Freq: Four times a day (QID) | ORAL | Status: DC | PRN

## 2020-02-23 MED ORDER — ENOXAPARIN SODIUM 40 MG/0.4ML ~~LOC~~ SOLN
40.0000 mg | SUBCUTANEOUS | Status: DC
  Administered 2020-02-24: 40 mg via SUBCUTANEOUS
  Filled 2020-02-23: qty 0.4

## 2020-02-23 NOTE — ED Triage Notes (Signed)
Pt brought in by [facility] from home, pt states he drank about 8 beers today, states drinks regularly and fell this am. States was not able to get up and get in the bed and that is why he called EMS. Only complaint is right shoulder pain.
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
## 2020-02-23 NOTE — ED Notes (Signed)
Pt brought in by [facility] from home, pt states he drank about 8 beers today, states drinks regularly and fell this am. States was not able to get up and get in the bed and that is why he called EMS. Only complaint is right shoulder pain. Pt has hx of stroke and has limited mobility to right side, states uses cane and walker. Pt denies any head or neck injury. Denies any HI or SI at this time.
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
## 2020-02-23 NOTE — ED Notes (Signed)
Pt co nausea, meds given.

## 2020-02-23 NOTE — ED Notes (Signed)
Pt sent over to CT scan

## 2020-02-23 NOTE — ED Notes (Signed)
Report called to [place], pt admitted to 117.

## 2020-02-23 NOTE — ED Notes (Signed)
Pt resting comfortably at this time pending admission.

## 2020-02-23 NOTE — ED Notes (Signed)
Pt resting comfortably pending admission

## 2020-02-24 ENCOUNTER — Encounter: Payer: Self-pay | Admitting: Family Medicine

## 2020-02-24 DIAGNOSIS — F1010 Alcohol abuse, uncomplicated: Secondary | ICD-10-CM

## 2020-02-24 DIAGNOSIS — D696 Thrombocytopenia, unspecified: Secondary | ICD-10-CM

## 2020-02-24 LAB — MAGNESIUM: Magnesium: 1.8 mg/dL (ref 1.7–2.4)

## 2020-02-24 LAB — URINE DRUG SCREEN, QUALITATIVE (ARMC ONLY)
Amphetamines, Ur Screen: NOT DETECTED
Barbiturates, Ur Screen: NOT DETECTED
Benzodiazepine, Ur Scrn: POSITIVE — AB
Cannabinoid 50 Ng, Ur ~~LOC~~: NOT DETECTED
Cocaine Metabolite,Ur ~~LOC~~: NOT DETECTED
MDMA (Ecstasy)Ur Screen: NOT DETECTED
Methadone Scn, Ur: NOT DETECTED
Opiate, Ur Screen: NOT DETECTED
Phencyclidine (PCP) Ur S: NOT DETECTED
Tricyclic, Ur Screen: NOT DETECTED

## 2020-02-24 LAB — URINALYSIS, COMPLETE (UACMP) WITH MICROSCOPIC
Bacteria, UA: NONE SEEN
Bilirubin Urine: NEGATIVE
Glucose, UA: NEGATIVE mg/dL
Hgb urine dipstick: NEGATIVE
Ketones, ur: 5 mg/dL — AB
Leukocytes,Ua: NEGATIVE
Nitrite: NEGATIVE
Protein, ur: NEGATIVE mg/dL
Specific Gravity, Urine: 1.013 (ref 1.005–1.030)
pH: 7 (ref 5.0–8.0)

## 2020-02-24 LAB — CBC
HCT: 39.8 % (ref 39.0–52.0)
Hemoglobin: 14 g/dL (ref 13.0–17.0)
MCH: 31.4 pg (ref 26.0–34.0)
MCHC: 35.2 g/dL (ref 30.0–36.0)
MCV: 89.2 fL (ref 80.0–100.0)
Platelets: 59 10*3/uL — ABNORMAL LOW (ref 150–400)
RBC: 4.46 MIL/uL (ref 4.22–5.81)
RDW: 12.7 % (ref 11.5–15.5)
WBC: 6.3 10*3/uL (ref 4.0–10.5)
nRBC: 0 % (ref 0.0–0.2)

## 2020-02-24 LAB — PATHOLOGIST SMEAR REVIEW

## 2020-02-24 LAB — COMPREHENSIVE METABOLIC PANEL
ALT: 33 U/L (ref 0–44)
AST: 53 U/L — ABNORMAL HIGH (ref 15–41)
Albumin: 3.6 g/dL (ref 3.5–5.0)
Alkaline Phosphatase: 83 U/L (ref 38–126)
Anion gap: 9 (ref 5–15)
BUN: <5 mg/dL — ABNORMAL LOW (ref 8–23)
CO2: 27 mmol/L (ref 22–32)
Calcium: 7.6 mg/dL — ABNORMAL LOW (ref 8.9–10.3)
Chloride: 101 mmol/L (ref 98–111)
Creatinine, Ser: 0.38 mg/dL — ABNORMAL LOW (ref 0.61–1.24)
GFR calc Af Amer: >60 mL/min (ref >60)
GFR calc non Af Amer: >60 mL/min (ref >60)
Glucose, Bld: 77 mg/dL (ref 70–99)
Potassium: 3.6 mmol/L (ref 3.5–5.1)
Sodium: 137 mmol/L (ref 135–145)
Total Bilirubin: 1.2 mg/dL (ref 0.3–1.2)
Total Protein: 6.3 g/dL — ABNORMAL LOW (ref 6.5–8.1)

## 2020-02-24 LAB — GLUCOSE, CAPILLARY
Glucose-Capillary: 70 mg/dL (ref 70–99)
Glucose-Capillary: 59 mg/dL — ABNORMAL LOW (ref 70–99)
Glucose-Capillary: 111 mg/dL — ABNORMAL HIGH (ref 70–99)
Glucose-Capillary: 119 mg/dL — ABNORMAL HIGH (ref 70–99)

## 2020-02-24 LAB — PROTIME-INR
INR: 1.2 (ref 0.8–1.2)
Prothrombin Time: 14.7 seconds (ref 11.4–15.2)

## 2020-02-24 LAB — SARS CORONAVIRUS 2 (TAT 6-24 HRS): SARS Coronavirus 2: NEGATIVE

## 2020-02-24 LAB — SAVE SMEAR(SSMR), FOR PROVIDER SLIDE REVIEW

## 2020-02-24 LAB — IMMATURE PLATELET FRACTION: Immature Platelet Fraction: 3.2 % (ref 1.2–8.6)

## 2020-02-24 LAB — NA AND K (SODIUM & POTASSIUM), RAND UR
Potassium Urine: 48 mmol/L
Sodium, Ur: 126 mmol/L

## 2020-02-24 LAB — ETHANOL: Alcohol, Ethyl (B): 207 mg/dL — ABNORMAL HIGH (ref <10)

## 2020-02-24 LAB — LACTATE DEHYDROGENASE: LDH: 145 U/L (ref 98–192)

## 2020-02-24 LAB — HIV ANTIBODY (ROUTINE TESTING W REFLEX): HIV Screen 4th Generation wRfx: NONREACTIVE

## 2020-02-24 LAB — HEPATITIS C ANTIBODY: HCV Ab: NONREACTIVE

## 2020-02-24 LAB — FIBRINOGEN: Fibrinogen: 219 mg/dL (ref 210–475)

## 2020-02-24 LAB — VITAMIN B12: Vitamin B-12: 612 pg/mL (ref 180–914)

## 2020-02-24 LAB — APTT: aPTT: 35 seconds (ref 24–36)

## 2020-02-24 LAB — OSMOLALITY, URINE: Osmolality, Ur: 508 mOsm/kg (ref 300–900)

## 2020-02-24 MED ORDER — ENSURE ENLIVE PO LIQD
237.0000 mL | Freq: Three times a day (TID) | ORAL | Status: DC
  Administered 2020-02-24 – 2020-03-01 (×17): 237 mL via ORAL

## 2020-02-24 MED ORDER — METOCLOPRAMIDE HCL 5 MG/ML IJ SOLN
10.0000 mg | Freq: Once | INTRAMUSCULAR | Status: AC
  Administered 2020-02-24: 10 mg via INTRAVENOUS
  Filled 2020-02-24: qty 2

## 2020-02-24 MED ORDER — DEXTROSE 50 % IV SOLN
INTRAVENOUS | Status: AC
  Filled 2020-02-24: qty 50

## 2020-02-24 MED ORDER — FOLIC ACID 1 MG PO TABS
1.0000 mg | ORAL_TABLET | Freq: Every day | ORAL | Status: DC
  Administered 2020-02-25 – 2020-03-01 (×6): 1 mg via ORAL
  Filled 2020-02-24 (×6): qty 1

## 2020-02-24 NOTE — Progress Notes (Signed)
PROGRESS NOTE    Douglas Peterson.  LKG:[id number] DOB: [birth year] DOA: 02/23/2020 PCP: Douglas Kayser, MD   Brief Narrative:  Douglas Peterson  is a 69 y.o. Caucasian male with a known history of alcohol abuse, hypertension and traumatic brain injury, who presented to the emergency room with acute onset of generalized weakness and subsequent fall with inability to get up and right shoulder pain.  He admitted to hitting his head when he fell without loss of consciousness.  He has been having nausea and vomiting as well as diarrhea per his report.  Not any abdominal pain.  Labs with mild hyponatremia.  Sodium of 129 and isolated thrombocytopenia of 73.  Imaging were negative for any acute injuries Lovenox.  Subjective: Patient was complaining of generalized weakness.  Denies any abnormal bleeding or bruising.  Denies any pain.  Assessment & Plan:   Active Problems:   Hyponatremia  Hyponatremia.  Patient did receive some normal saline in the ED.  He has very mild hyponatremia most likely secondary to beer potomania.  Appears dehydrated with elevated serum osmolality. -Continue with IV fluid today. -Continue to monitor.  Generalized weakness and frequent falls.  Most likely secondary to alcohol intoxication.  Alcohol level markedly elevated. -PT is recommending SNF placement. -UA and urine drug screen still pending. -Social worker to find him a bed at SNF.  Thrombocytopenia.  Platelets of 73 on admission which dropped to 59 this morning.  Had platelets above 200, 3 weeks ago when he was admitted for persistent nausea and dry heaving and there was some concern of metabolic encephalopathy which resolved pretty quickly.  Patient has an history of hepatic steatosis, no cirrhosis seen on CT scan.  No significant hepatic dysfunction.  Hep C and HIV negative, LDH and fibrinogen within normal limit.  No obvious bleeding. Seems like patient to have chronic intermittent thrombocytopenia since 2016. -Smear  review pending. -Vitamin B12 and RBC folate levels pending. -Hold Lovenox. -Hematology consult-appreciate their recommendations.  Alcohol abuse./Alcohol intoxication.  Alcohol level elevated at 207.  Patient drinks on a daily basis.  Denies any withdrawal which I doubt. -Continue with CIWA protocol with as needed Ativan. -Continue with vitamins, folic acid and thiamine. -I counseled him again regarding alcohol cessation.  Mildly elevated AST.  Some improvement with IV fluid. -Continue to monitor.  Hypertension.  Blood pressure within goal. -Continue to monitor. -As needed labetalol  Paroxysmal A. Fib.  Currently in sinus rhythm. -Not a good candidate for anticoagulation due to thrombocytopenia and risk of falls.  Objective: Vitals:   02/23/20 1923 02/23/20 2310 02/24/20 0111 02/24/20 1027  BP:  125/67 (!) 133/51 138/60  Pulse:  95 82 88  Resp:  20 (!) 23   Temp:  98.5 F (36.9 C) 97.8 F (36.6 C)   TempSrc:  Oral Oral   SpO2:  96% 100%   Weight: 53 kg     Height: 5' 4"  (1.626 m)       Intake/Output Summary (Last 24 hours) at 02/24/2020 1336 Last data filed at 02/24/2020 1009 Gross per 24 hour  Intake --  Output 300 ml  Net -300 ml   Filed Weights   02/23/20 1923  Weight: 53 kg    Examination:  General exam: Chronically ill-appearing, emaciated gentleman,appears calm and comfortable  Respiratory system: Clear to auscultation. Respiratory effort normal. Cardiovascular system: S1 & S2 heard, RRR. No JVD, murmurs, rubs, gallops or clicks. Gastrointestinal system: Soft, nontender, nondistended, bowel sounds positive. Central nervous system: Alert and  oriented. No focal neurological deficits.right arm contractures. Extremities: No edema, no cyanosis, pulses intact and symmetrical. Psychiatry: Judgement and insight appear normal.    DVT prophylaxis: SCDs-Lovenox stopped due to worsening thrombocytopenia. Code Status: Full Family Communication: No family at bedside.   Discussed with patient. Disposition Plan:  Status is: Inpatient  Remains inpatient appropriate because:Unsafe d/c plan   Dispo: The patient is from: Home              Anticipated d/c is to: SNF              Anticipated d/c date is: 1-2 days.              Patient currently is not medically stable to d/c.  Pending bed availability and insurance authorization for SNF.  Might not occur over the weekend.  Consultants:   Hematology  Procedures:  Antimicrobials:   Data Reviewed: I have personally reviewed following labs and imaging studies  CBC: Recent Labs  Lab 02/23/20 2028 02/24/20 0532  WBC 8.8 6.3  NEUTROABS 2.9  --   HGB 14.9 14.0  HCT 42.6 39.8  MCV 89.1 89.2  PLT 73* 59*   Basic Metabolic Panel: Recent Labs  Lab 02/23/20 2028 02/24/20 0532  NA 129* 137  K 3.9 3.6  CL 88* 101  CO2 26 27  GLUCOSE 109* 77  BUN <5* <5*  CREATININE 0.39* 0.38*  CALCIUM 8.2* 7.6*  MG  --  1.8   GFR: Estimated Creatinine Clearance: 66.3 mL/min (A) (by C-G formula based on SCr of 0.38 mg/dL (L)). Liver Function Tests: Recent Labs  Lab 02/23/20 2028 02/24/20 0532  AST 64* 53*  ALT 37 33  ALKPHOS 99 83  BILITOT 1.0 1.2  PROT 7.2 6.3*  ALBUMIN 4.2 3.6   No results for input(s): LIPASE, AMYLASE in the last 168 hours. No results for input(s): AMMONIA in the last 168 hours. Coagulation Profile: Recent Labs  Lab 02/24/20 0801  INR 1.2   Cardiac Enzymes: No results for input(s): CKTOTAL, CKMB, CKMBINDEX, TROPONINI in the last 168 hours. BNP (last 3 results) No results for input(s): PROBNP in the last 8760 hours. HbA1C: No results for input(s): HGBA1C in the last 72 hours. CBG: Recent Labs  Lab 02/24/20 0811 02/24/20 1144  GLUCAP 70 59*   Lipid Profile: No results for input(s): CHOL, HDL, LDLCALC, TRIG, CHOLHDL, LDLDIRECT in the last 72 hours. Thyroid Function Tests: No results for input(s): TSH, T4TOTAL, FREET4, T3FREE, THYROIDAB in the last 72 hours. Anemia  Panel: No results for input(s): VITAMINB12, FOLATE, FERRITIN, TIBC, IRON, RETICCTPCT in the last 72 hours. Sepsis Labs: No results for input(s): PROCALCITON, LATICACIDVEN in the last 168 hours.  Recent Results (from the past 240 hour(s))  SARS CORONAVIRUS 2 (TAT 6-24 HRS) Nasopharyngeal Nasopharyngeal Swab     Status: None   Collection Time: 02/23/20 11:49 PM   Specimen: Nasopharyngeal Swab  Result Value Ref Range Status   SARS Coronavirus 2 NEGATIVE NEGATIVE Final    Comment: (NOTE) SARS-CoV-2 target nucleic acids are NOT DETECTED. The SARS-CoV-2 RNA is generally detectable in upper and lower respiratory specimens during the acute phase of infection. Negative results do not preclude SARS-CoV-2 infection, do not rule out co-infections with other pathogens, and should not be used as the sole basis for treatment or other patient management decisions. Negative results must be combined with clinical observations, patient history, and epidemiological information. The expected result is Negative. Fact Sheet for Patients: SugarRoll.be Fact Sheet for Healthcare Providers:  https://www.woods-mathews.com/ This test is not yet approved or cleared by the [place] and  has been authorized for detection and/or diagnosis of SARS-CoV-2 by FDA under an Emergency Use Authorization (EUA). This EUA will remain  in effect (meaning this test can be used) for the duration of the COVID-19 declaration under Section 56 4(b)(1) of the Act, 21 U.S.C. section 360bbb-3(b)(1), unless the authorization is terminated or revoked sooner. Performed at [facility], [place] [address], [place], [place] [address]      Radiology Studies: DG Shoulder Right  Result Date: 02/23/2020 CLINICAL DATA:  Right shoulder pain, fall EXAM: RIGHT SHOULDER - 2+ VIEW COMPARISON:  06/14/2019 FINDINGS: There is deformity of the proximal right humerus related to old healed fracture.  Degenerative changes in the right AC and glenohumeral joints with joint space narrowing. No acute fracture, subluxation or dislocation. IMPRESSION: Old healed proximal right humeral fracture with deformity. No acute fracture. Electronically Signed   By: Rolm Baptise M.D.   On: 02/23/2020 20:00   CT HEAD WO CONTRAST  Result Date: 02/23/2020 CLINICAL DATA:  Patient drank 8 beers today and fell this morning. EXAM: CT HEAD WITHOUT CONTRAST TECHNIQUE: Contiguous axial images were obtained from the base of the skull through the vertex without intravenous contrast. COMPARISON:  01/17/2020 FINDINGS: Brain: No evidence of acute infarction, hemorrhage, hydrocephalus, extra-axial collection or mass lesion/mass effect. There is ventricular sulcal enlargement reflecting atrophy, advanced for age. Minor periventricular white matter hypoattenuation is also noted consistent with chronic microvascular ischemic change. Vascular: No hyperdense vessel or unexpected calcification. Skull: Normal. Negative for fracture or focal lesion. Sinuses/Orbits: Globes and orbits are unremarkable. Visualized sinuses are clear. Other: None. IMPRESSION: 1. No acute intracranial abnormalities. 2. Atrophy advanced for age. Minor chronic microvascular ischemic change. Electronically Signed   By: Lajean Manes M.D.   On: 02/23/2020 19:56   CT CERVICAL SPINE WO CONTRAST  Result Date: 02/23/2020 CLINICAL DATA:  Fall while intoxicated EXAM: CT CERVICAL SPINE WITHOUT CONTRAST TECHNIQUE: Multidetector CT imaging of the cervical spine was performed without intravenous contrast. Multiplanar CT image reconstructions were also generated. COMPARISON:  CT 10/30/2019 FINDINGS: Alignment: Straightening of normal cervical lordosis with slight focal reversal at the C5-6 level. There is slight leftward rotation of the cranium with craniocervical atlantoaxial articulations limits for this degree of rotation. No abnormally widened, perched or jumped facets. Skull  base and vertebrae: Fusion of the C5-6 vertebral bodies with chronic anterior wedging deformity of the C6 levels. Bony fusion across the left C4-5 articular facets right C5-6 articular facets. No acute fracture. No primary bone lesion or focal pathologic process. Soft tissues and spinal canal: No pre or paravertebral fluid or swelling. No visible canal hematoma. Disc levels: Multilevel intervertebral disc height loss with spondylitic endplate changes. Disc osteophyte complex at C5-6 and C6-C7 result in at most mild canal stenosis. Multilevel uncinate spurring and facet hypertrophic changes result in multilevel neural foraminal narrowing as well. Upper chest: No acute abnormality in the upper chest or imaged lung apices. Other: Normal thyroid. Cervical carotid atherosclerosis. For findings within the head, please see dedicated CT of the head performed concurrently. IMPRESSION: 1. No acute fracture or malalignment of the cervical spine. 2. Fusion of the C5-6 vertebral bodies with chronic anterior wedging deformity of the C6 levels. 3. Multilevel degenerative disc disease and facet hypertrophic changes, detailed above. 4. Cervical carotid atherosclerosis. Electronically Signed   By: Lovena Le M.D.   On: 02/23/2020 20:06    Scheduled Meds: .  acetaminophen  1,000 mg Oral On Call to OR  . atenolol  25 mg Oral BID  . baclofen  10 mg Oral TID  . dextrose      . feeding supplement (ENSURE ENLIVE)  237 mL Oral BID BM  . folic acid  1 mg Intravenous Daily  . gabapentin  300 mg Oral On Call to OR  . multivitamin with minerals  1 tablet Oral Daily  . pantoprazole  20 mg Oral Daily  . PARoxetine  30 mg Oral Daily  . senna-docusate  2 tablet Oral BID  . thiamine  100 mg Oral Daily   Continuous Infusions: . sodium chloride 125 mL/hr at 02/24/20 0831     LOS: 1 day   Time spent: 50 minutes.  Lorella Nimrod, MD [facility]  If 7PM-7AM, please contact night-coverage [email or web address]  02/24/2020, 1:36  PM   This record has been created using Systems analyst. Errors have been sought and corrected,but may not always be located. Such creation errors do not reflect on the standard of care.

## 2020-02-24 NOTE — Plan of Care (Signed)
1200 CBG was 59.  Dr. Kandice Robinsons and gave verbal to give OJ, DC NPO status and order diet.  D50 pulled d/t NPO status - but not given.

## 2020-02-24 NOTE — Progress Notes (Signed)
Initial Nutrition Assessment  DOCUMENTATION CODES:   Severe malnutrition in context of social or environmental circumstances  INTERVENTION:  Recommend liberalizing diet to regular.  Continue Ensure Enlive po TID, each supplement provides 350 kcal and 20 grams of protein.  Continue MVI daily, thiamine 329 mg daily, folic acid 1 mg daily.  Monitor magnesium, potassium, and phosphorus daily for at least 3 days, MD to replete as needed, as pt is at risk for refeeding syndrome given EtOH abuse, severe malnutrition.  NUTRITION DIAGNOSIS:   Severe Malnutrition related to social / environmental circumstances(EtOH abuse, inadequate oral intake) as evidenced by severe fat depletion, moderate muscle depletion, severe muscle depletion.  GOAL:   Patient will meet greater than or equal to 90% of their needs  MONITOR:   PO intake, Supplement acceptance, Labs, Weight trends, I & O's  REASON FOR ASSESSMENT:   Malnutrition Screening Tool    ASSESSMENT:   69 year old male with PMHx of EtOH abuse, TBI, HTN admitted with hyponatremia, generalized weakness and frequent falls most likely secondary to alcohol intoxication.   Met with patient at bedside. He is known to this RD from previous admissions. Patient had just received his lunch tray after diet was advanced from NPO to heart healthy. He reports this is the first meal he has had in 3-4 days. Patient was shaking very badly and could not hold his fork. He reported he would need help with eating (RD discussed with RN following assessment). Patient reports he was eating "okay" PTA. He reports eating eggs with toast for breakfast. He receives Meals on Wheels as well now. Patient does endorse skipping meals on days where he drinks alcohol heavily.  Patient reports his UBW is 120 lbs but that his weight fluctuates. According to chart patient was 56.7 kg on 10/30/2019. He is now 53 kg (116.84 lbs). He has lost 3.7 kg (6.5% body weight) over the past 4  months, which is not significant for time frame.  Medications reviewed and include: folic acid 1 mg daily, MVI daily, Protonix, senna-docusate 2 tablets BID, thiamine 100 mg daily, NS at 125 mL/hr.  Labs reviewed: CBG 59-70, BUN <5, Creatinine 0.38.  NUTRITION - FOCUSED PHYSICAL EXAM:    Most Recent Value  Orbital Region  Severe depletion  Upper Arm Region  Severe depletion  Thoracic and Lumbar Region  Moderate depletion  Buccal Region  Severe depletion  Temple Region  Severe depletion  Clavicle Bone Region  Severe depletion  Clavicle and Acromion Bone Region  Moderate depletion  Scapular Bone Region  Moderate depletion  Dorsal Hand  Moderate depletion  Patellar Region  Severe depletion  Anterior Thigh Region  Severe depletion  Posterior Calf Region  Severe depletion  Edema (RD Assessment)  None  Hair  Reviewed  Eyes  Reviewed  Mouth  Reviewed  Skin  Reviewed  Nails  Reviewed     Diet Order:   Diet Order            Diet Heart Room service appropriate? Yes; Fluid consistency: Thin  Diet effective now             EDUCATION NEEDS:   No education needs have been identified at this time  Skin:  Skin Assessment: Skin Integrity Issues:(MSAD to buttocks and groin)  Last BM:  02/23/2020  Height:   Ht Readings from Last 1 Encounters:  02/23/20 5' 4"  (1.626 m)   Weight:   Wt Readings from Last 1 Encounters:  02/23/20 53 kg  Ideal Body Weight:  59.1 kg  BMI:  Body mass index is 20.06 kg/m.  Estimated Nutritional Needs:   Kcal:  1500-1700  Protein:  75-85 grams  Fluid:  1.5 L/day  Jacklynn Barnacle, MS, RD, LDN Pager number available on Amion

## 2020-02-24 NOTE — Evaluation (Signed)
Physical Therapy Evaluation Patient Details Name: Douglas Peterson. MRN: [id number] DOB: 1950-12-19 Today's Date: 02/24/2020   History of Present Illness  68yoM with multiple recent admits for similar.  He came from home w/ AMS, fall (hit head w/o LOC) and persistent nausea.  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  Clinical Impression  Pt awake but tired on arrival, eager to try to do some standing/mobility.  Pt also repeatedly asking about timing/status of hernia repair surgery ostensibly later today.  Pt was able to do more than he typically does with first attempt at PT, actually getting to standing and managing a few (unsteady, assisted) steps from bed to recliner with QC.  Pt lives along and per current status is unsafe to manage at home, recommending STR at this point. Pt reports he has had good results with [facility] in the past.      Follow Up Recommendations SNF    Equipment Recommendations  None recommended by PT    Recommendations for Other Services       Precautions / Restrictions Precautions Precautions: Fall Restrictions Weight Bearing Restrictions: No      Mobility  Bed Mobility Overal bed mobility: Needs Assistance Bed Mobility: Supine to Sit;Sit to Supine     Supine to sit: Min assist     General bed mobility comments: Pt showed good effort in getting to/from supine, first to L and then to R side of bed.  Needed only light assist each time to guide trunk to safe/upright  Transfers Overall transfer level: Needs assistance Equipment used: Quad cane Transfers: Sit to/from Stand Sit to Stand: Min assist         General transfer comment: Pt needed some direct assist to shift weight forward and insure upright posture/appropriate use of QC.  Once up pt able to maintain static standing relatively well.  Ambulation/Gait Ambulation/Gait assistance: Mod assist Gait Distance (Feet): 4 Feet Assistive  device: Quad cane       General Gait Details: Pt able to take an initial step with some confidence and then started to have some tremoring and fear of falling that did require PT to give direct assist to insure he stayed upright and did not panic with modest transfer from bed to recliner - just a few turning steps with QC  Stairs            Wheelchair Mobility    Modified Rankin (Stroke Patients Only)       Balance Overall balance assessment: Needs assistance Sitting-balance support: Single extremity supported Sitting balance-Leahy Scale: Fair Sitting balance - Comments: Pt reports that he feels like he is going to fall off the bed/chair despite being well onto the surface in each instance   Standing balance support: Single extremity supported Standing balance-Leahy Scale: Poor Standing balance comment: Pt initially able to hold static standing balance relatively confidently, however with any movement/dynamic task he quickly became anxious and displayed poor standing balance/safety                             Pertinent Vitals/Pain Pain Assessment: (endorses some pain in R shoulder and b/l hips)    Home Living Family/patient expects to be discharged to:: Private residence Living Arrangements: Alone Available Help at Discharge: Family(sister runs most of his errands, etc) Type of Home: House Home Access: Stairs to enter Entrance Stairs-Rails: Right;Left;Can reach both Entrance Stairs-Number of Steps:  4 Home Layout: One level Home Equipment: Cane - single point;Cane - quad;Walker - 2 wheels;Tub bench;Grab bars - tub/shower      Prior Function Level of Independence: Independent with assistive device(s)("As long as I'm not drinking I do alright")               Hand Dominance   Dominant Hand: Left    Extremity/Trunk Assessment   Upper Extremity Assessment Upper Extremity Assessment: Generalized weakness(chronic R UE weakness and flexor tone)     Lower Extremity Assessment Lower Extremity Assessment: Overall WFL for tasks assessed(L grossly 4/5, chronic R sided weakness gt 3+/5)       Communication   Communication: No difficulties  Cognition Arousal/Alertness: Awake/alert Behavior During Therapy: WFL for tasks assessed/performed Overall Cognitive Status: Within Functional Limits for tasks assessed                                        General Comments      Exercises     Assessment/Plan    PT Assessment Patient needs continued PT services  PT Problem List Decreased strength;Decreased range of motion;Decreased activity tolerance;Decreased balance;Decreased mobility;Decreased coordination;Decreased knowledge of use of DME;Decreased safety awareness;Decreased knowledge of precautions       PT Treatment Interventions DME instruction;Gait training;Stair training;Functional mobility training;Therapeutic activities;Therapeutic exercise;Balance training;Neuromuscular re-education;Patient/family education    PT Goals (Current goals can be found in the Care Plan section)  Acute Rehab PT Goals Patient Stated Goal: Get back to walking well and living on his own PT Goal Formulation: With patient Time For Goal Achievement: 03/09/20 Potential to Achieve Goals: Fair    Frequency Min 2X/week   Barriers to discharge        Co-evaluation               AM-PAC PT "6 Clicks" Mobility  Outcome Measure Help needed turning from your back to your side while in a flat bed without using bedrails?: A Little Help needed moving from lying on your back to sitting on the side of a flat bed without using bedrails?: A Little Help needed moving to and from a bed to a chair (including a wheelchair)?: A Lot Help needed standing up from a chair using your arms (e.g., wheelchair or bedside chair)?: A Little Help needed to walk in hospital room?: A Lot Help needed climbing 3-5 steps with a railing? : Total 6 Click Score:  14    End of Session Equipment Utilized During Treatment: Gait belt Activity Tolerance: Patient tolerated treatment well;Patient limited by fatigue Patient left: with chair alarm set;with call bell/phone within reach Nurse Communication: Mobility status PT Visit Diagnosis: Muscle weakness (generalized) (M62.81);Difficulty in walking, not elsewhere classified (R26.2);Unsteadiness on feet (R26.81);Repeated falls (R29.6)    Time: 0340-3524 PT Time Calculation (min) (ACUTE ONLY): 27 min   Charges:   PT Evaluation $PT Eval Low Complexity: 1 Low PT Treatments $Therapeutic Activity: 8-22 mins         R . DPT 02/24/2020, 10:59 AM
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
## 2020-02-25 DIAGNOSIS — E43 Unspecified severe protein-calorie malnutrition: Secondary | ICD-10-CM | POA: Insufficient documentation

## 2020-02-25 DIAGNOSIS — E871 Hypo-osmolality and hyponatremia: Principal | ICD-10-CM

## 2020-02-25 LAB — CBC
HCT: 38 % — ABNORMAL LOW (ref 39.0–52.0)
Hemoglobin: 13.6 g/dL (ref 13.0–17.0)
MCH: 31.6 pg (ref 26.0–34.0)
MCHC: 35.8 g/dL (ref 30.0–36.0)
MCV: 88.2 fL (ref 80.0–100.0)
Platelets: 49 10*3/uL — ABNORMAL LOW (ref 150–400)
RBC: 4.31 MIL/uL (ref 4.22–5.81)
RDW: 12.4 % (ref 11.5–15.5)
WBC: 5.3 10*3/uL (ref 4.0–10.5)
nRBC: 0 % (ref 0.0–0.2)

## 2020-02-25 LAB — COMPREHENSIVE METABOLIC PANEL
ALT: 38 U/L (ref 0–44)
AST: 66 U/L — ABNORMAL HIGH (ref 15–41)
Albumin: 3.8 g/dL (ref 3.5–5.0)
Alkaline Phosphatase: 117 U/L (ref 38–126)
Anion gap: 8 (ref 5–15)
BUN: 7 mg/dL — ABNORMAL LOW (ref 8–23)
CO2: 28 mmol/L (ref 22–32)
Calcium: 8.6 mg/dL — ABNORMAL LOW (ref 8.9–10.3)
Chloride: 99 mmol/L (ref 98–111)
Creatinine, Ser: 0.4 mg/dL — ABNORMAL LOW (ref 0.61–1.24)
GFR calc Af Amer: >60 mL/min (ref >60)
GFR calc non Af Amer: >60 mL/min (ref >60)
Glucose, Bld: 122 mg/dL — ABNORMAL HIGH (ref 70–99)
Potassium: 3 mmol/L — ABNORMAL LOW (ref 3.5–5.1)
Sodium: 135 mmol/L (ref 135–145)
Total Bilirubin: 1.6 mg/dL — ABNORMAL HIGH (ref 0.3–1.2)
Total Protein: 6.8 g/dL (ref 6.5–8.1)

## 2020-02-25 LAB — GLUCOSE, CAPILLARY
Glucose-Capillary: 103 mg/dL — ABNORMAL HIGH (ref 70–99)
Glucose-Capillary: 121 mg/dL — ABNORMAL HIGH (ref 70–99)
Glucose-Capillary: 122 mg/dL — ABNORMAL HIGH (ref 70–99)
Glucose-Capillary: 140 mg/dL — ABNORMAL HIGH (ref 70–99)
Glucose-Capillary: 185 mg/dL — ABNORMAL HIGH (ref 70–99)
Glucose-Capillary: 97 mg/dL (ref 70–99)

## 2020-02-25 LAB — MAGNESIUM: Magnesium: 1.5 mg/dL — ABNORMAL LOW (ref 1.7–2.4)

## 2020-02-25 LAB — PHOSPHORUS: Phosphorus: 2.4 mg/dL — ABNORMAL LOW (ref 2.5–4.6)

## 2020-02-25 MED ORDER — ADULT MULTIVITAMIN W/MINERALS CH
1.0000 | ORAL_TABLET | Freq: Every day | ORAL | Status: DC
  Filled 2020-02-25: qty 1

## 2020-02-25 MED ORDER — LORAZEPAM 1 MG PO TABS
1.0000 mg | ORAL_TABLET | ORAL | Status: AC | PRN
  Administered 2020-02-25: 13:00:00 1 mg via ORAL
  Filled 2020-02-25 (×2): qty 2

## 2020-02-25 MED ORDER — POTASSIUM PHOSPHATES 15 MMOLE/5ML IV SOLN
20.0000 mmol | Freq: Once | INTRAVENOUS | Status: AC
  Administered 2020-02-25: 20 mmol via INTRAVENOUS
  Filled 2020-02-25: qty 6.67

## 2020-02-25 MED ORDER — LORAZEPAM 2 MG/ML IJ SOLN: 1.0000 mg | INTRAMUSCULAR | Status: AC | PRN

## 2020-02-25 MED ORDER — HYDRALAZINE HCL 50 MG PO TABS: 25.0000 mg | ORAL_TABLET | Freq: Three times a day (TID) | ORAL | Status: DC | PRN

## 2020-02-25 MED ORDER — MAGNESIUM SULFATE 2 GM/50ML IV SOLN
2.0000 g | Freq: Once | INTRAVENOUS | Status: AC
  Administered 2020-02-25: 09:00:00 2 g via INTRAVENOUS
  Filled 2020-02-25: qty 50

## 2020-02-25 NOTE — Progress Notes (Addendum)
Alerted Dr. Reesa Chew that patient's BP is 179/107 HR 83. Lebatalol given. Asymptomatic. MD acknowledges, no new orders. BP came back down to 140/93. Will continue to monitor. MD confirmed again that no telemetry monitoring is needed.

## 2020-02-25 NOTE — Consult Note (Signed)
[facility] for Electrolyte Monitoring and Replacement   Recent Labs: Potassium (mmol/L)  Date Value  02/25/2020 3.0 (L)   Magnesium (mg/dL)  Date Value  02/25/2020 1.5 (L)   Calcium (mg/dL)  Date Value  02/25/2020 8.6 (L)   Albumin (g/dL)  Date Value  02/25/2020 3.8   Phosphorus (mg/dL)  Date Value  02/25/2020 2.4 (L)   Sodium (mmol/L)  Date Value  02/25/2020 135   Corrected Ca: 8.76 mg/dL  Assessment: 69 y.o.Caucasian malewith a known history of alcohol abuse, hypertension and traumatic brain injury with likely refeeding syndrome  Goal of Therapy:  Electrolytes WNL  Plan:   2 grams IV magnesium sulfate x 1  20 mmol IV potassium phosphate x 1 (this provides approximately 29 mEq IV potassium)  Follow-up electrolytes in am 4/25  Dallie Piles ,PharmD Clinical Pharmacist 02/25/2020 7:32 AM

## 2020-02-25 NOTE — Progress Notes (Addendum)
MD at bedside who stated that patient's seeming agitated. CIWA assessed and patient is agitated, fairly confused, and anxious. Ativan given. Md changed order for CIWA protocol. CIWAs will be assessed frequently. MD placed order for 1 to 1 sitter as patient continuously tries to get out of bed. Sitter at bedside. Ativan will be given as needed per protocol.
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
## 2020-02-25 NOTE — Progress Notes (Signed)
Alerted Dr. Reesa Chew that patient's K is 3, phos is 2.4, mag is 1.5 and platelets are 49. Md acknowledges, replacements in. Platelets are chronic issue. No new orders. Will continue to monitor.

## 2020-02-25 NOTE — Progress Notes (Addendum)
Dr. Reesa Chew stated that patient does not need extra dose of multivitamin. Also MD stated to dc normal saline infusion. Completed. MD also stated that patient does not need to be on telemetry monitoring. Confirmed with MD that no telemetry monitoring is needed even though patient has potassium/phosphate infusion- MD confirmed that patient does not need to be on cardiac telemetry monitoring. Will continue to monitor patient closely.

## 2020-02-25 NOTE — Progress Notes (Signed)
PROGRESS NOTE    Douglas Peterson.  DGL:[id number] DOB: 1951-05-17 DOA: 02/23/2020 PCP: [name], MD   Brief Narrative:  Douglas Peterson  is a 68 y.o. Caucasian male with a known history of alcohol abuse, hypertension and traumatic brain injury, who presented to the emergency room with acute onset of generalized weakness and subsequent fall with inability to get up and right shoulder pain.  He admitted to hitting his head when he fell without loss of consciousness.  He has been having nausea and vomiting as well as diarrhea per his report.  Not any abdominal pain.  Labs with mild hyponatremia.  Sodium of 129 and isolated thrombocytopenia of 73.  Imaging were negative for any acute injuries Lovenox.  Subjective: Patient has no new complaints today.  Appears little agitated.  No overnight nursing concern.  Assessment & Plan:   Active Problems:   Hyponatremia   Protein-calorie malnutrition, severe  Hyponatremia.  Resolved  Patient did receive some normal saline in the ED.  He has very mild hyponatremia most likely secondary to beer potomania.  Appears dehydrated with elevated serum osmolality. -Discontinue with IV fluid-encouraged p.o. intake. -Continue to monitor.  Electrolyte abnormalities/refeeding syndrome.  Developed multiple electrolyte abnormalities including hypokalemia, hypomagnesemia and hypophosphatemia.  There is a concern of refeeding syndrome due to prior poor p.o. intake and alcohol abuse. -Replete electrolytes and closely monitor.  Generalized weakness and frequent falls.  Most likely secondary to alcohol intoxication.  Alcohol level markedly elevated. -PT is recommending SNF placement. -UA-no concern for infection, positive for ketones. -UDS-positive for benzo. -Education officer, museum to find him a bed at SNF.  Thrombocytopenia.  Platelets of 73 on admission which dropped to 49 this morning.  Had platelets above 200, 3 weeks ago when he was admitted for persistent nausea and  dry heaving and there was some concern of metabolic encephalopathy which resolved pretty quickly.  Patient has an history of hepatic steatosis, no cirrhosis seen on CT scan.  No significant hepatic dysfunction.  Hep C and HIV negative, LDH and fibrinogen within normal limit.  No obvious bleeding. Seems like patient to have chronic intermittent thrombocytopenia since 2016. -Smear show inappropriately normal immature platelet fraction with no blasts or schistocytes. -Vitamin B12-within normal limit. -RBC folate levels pending. -Hold Lovenox. -Hematology consult-appreciate their recommendations, clinically thrombocytopenia is likely due to bone marrow suppression secondary to alcohol intoxication and they were recommending close monitoring at this time.  Alcohol abuse./Alcohol intoxication.  Alcohol level elevated at 207.  Patient drinks on a daily basis.  Denies any withdrawal which I doubt.  Appears little agitated. CIWA score of 11. Had a long discussion with daughter today who told me that he is going through the same cycle which she has been done many times in the past.  He has an history of severe withdrawals and as soon as he recovers he will go back to drinking.  Patient has gone to a rehab facility is to get some rehab multiple times and as soon as he becomes stronger he will go home and start buying more beer. -Continue with CIWA protocol with as needed Ativan. -Continue with vitamins, folic acid and thiamine. -I counseled him again regarding alcohol cessation.  Mildly elevated AST.  Some improvement with IV fluid. -Continue to monitor.  Hypertension.  Blood pressure within goal. -Continue to monitor. -As needed labetalol  Paroxysmal A. Fib.  Currently in sinus rhythm. -Not a good candidate for anticoagulation due to thrombocytopenia and risk of falls.  Objective: Vitals:  02/24/20 1802 02/24/20 2341 02/25/20 0742 02/25/20 0800  BP: 136/84 (!) 161/87 (!) 153/88   Pulse: 85 81 66  81  Resp: 19 18 18    Temp: 98.1 F (36.7 C) 98.3 F (36.8 C) 98.1 F (36.7 C)   TempSrc: Oral  Oral   SpO2: 96% 94% 96%   Weight:      Height:        Intake/Output Summary (Last 24 hours) at 02/25/2020 1044 Last data filed at 02/25/2020 1014 Gross per 24 hour  Intake 1710.14 ml  Output 2575 ml  Net -864.86 ml   Filed Weights   02/23/20 1923  Weight: 53 kg    Examination:  General exam: Chronically ill-appearing, emaciated gentleman,appears mildly agitated but comfortable  Respiratory system: Clear to auscultation. Respiratory effort normal. Cardiovascular system: S1 & S2 heard, RRR. No JVD, murmurs, rubs, gallops or clicks. Gastrointestinal system: Soft, nontender, nondistended, bowel sounds positive. Central nervous system: Alert and oriented. No focal neurological deficits.right arm contractures. Extremities: No edema, no cyanosis, pulses intact and symmetrical. Psychiatry: Judgement and insight appear normal.    DVT prophylaxis: SCDs-Lovenox stopped due to worsening thrombocytopenia. Code Status: Full Family Communication: No family at bedside.  Daughter was updated on phone.  Disposition Plan:  Status is: Inpatient  Remains inpatient appropriate because:Unsafe d/c plan   Dispo: The patient is from: Home              Anticipated d/c is to: SNF              Anticipated d/c date is: 1-2 days.              Patient currently is not medically stable to d/c.  Pending bed availability and insurance authorization for SNF.  Might not occur over the weekend. Patient started withdrawal today.  Will need close monitoring and a sitter.  Consultants:   Hematology  Procedures:  Antimicrobials:   Data Reviewed: I have personally reviewed following labs and imaging studies  CBC: Recent Labs  Lab 02/23/20 2028 02/24/20 0532 02/25/20 0606  WBC 8.8 6.3 5.3  NEUTROABS 2.9  --   --   HGB 14.9 14.0 13.6  HCT 42.6 39.8 38.0*  MCV 89.1 89.2 88.2  PLT 73* 59* 49*    Basic Metabolic Panel: Recent Labs  Lab 02/23/20 2028 02/24/20 0532 02/25/20 0606  NA 129* 137 135  K 3.9 3.6 3.0*  CL 88* 101 99  CO2 26 27 28   GLUCOSE 109* 77 122*  BUN <5* <5* 7*  CREATININE 0.39* 0.38* 0.40*  CALCIUM 8.2* 7.6* 8.6*  MG  --  1.8 1.5*  PHOS  --   --  2.4*   GFR: Estimated Creatinine Clearance: 66.3 mL/min (A) (by C-G formula based on SCr of 0.4 mg/dL (L)). Liver Function Tests: Recent Labs  Lab 02/23/20 2028 02/24/20 0532 02/25/20 0606  AST 64* 53* 66*  ALT 37 33 38  ALKPHOS 99 83 117  BILITOT 1.0 1.2 1.6*  PROT 7.2 6.3* 6.8  ALBUMIN 4.2 3.6 3.8   No results for input(s): LIPASE, AMYLASE in the last 168 hours. No results for input(s): AMMONIA in the last 168 hours. Coagulation Profile: Recent Labs  Lab 02/24/20 0801  INR 1.2   Cardiac Enzymes: No results for input(s): CKTOTAL, CKMB, CKMBINDEX, TROPONINI in the last 168 hours. BNP (last 3 results) No results for input(s): PROBNP in the last 8760 hours. HbA1C: No results for input(s): HGBA1C in the last 72 hours. CBG: Recent  Labs  Lab 02/24/20 1630 02/24/20 2003 02/25/20 0010 02/25/20 0407 02/25/20 0739  GLUCAP 111* 119* 97 121* 122*   Lipid Profile: No results for input(s): CHOL, HDL, LDLCALC, TRIG, CHOLHDL, LDLDIRECT in the last 72 hours. Thyroid Function Tests: No results for input(s): TSH, T4TOTAL, FREET4, T3FREE, THYROIDAB in the last 72 hours. Anemia Panel: Recent Labs    02/24/20 0732  VITAMINB12 612   Sepsis Labs: No results for input(s): PROCALCITON, LATICACIDVEN in the last 168 hours.  Recent Results (from the past 240 hour(s))  SARS CORONAVIRUS 2 (TAT 6-24 HRS) Nasopharyngeal Nasopharyngeal Swab     Status: None   Collection Time: 02/23/20 11:49 PM   Specimen: Nasopharyngeal Swab  Result Value Ref Range Status   SARS Coronavirus 2 NEGATIVE NEGATIVE Final    Comment: (NOTE) SARS-CoV-2 target nucleic acids are NOT DETECTED. The SARS-CoV-2 RNA is generally  detectable in upper and lower respiratory specimens during the acute phase of infection. Negative results do not preclude SARS-CoV-2 infection, do not rule out co-infections with other pathogens, and should not be used as the sole basis for treatment or other patient management decisions. Negative results must be combined with clinical observations, patient history, and epidemiological information. The expected result is Negative. Fact Sheet for Patients: SugarRoll.be Fact Sheet for Healthcare Providers: https://www.woods-mathews.com/ This test is not yet approved or cleared by the [place] FDA and  has been authorized for detection and/or diagnosis of SARS-CoV-2 by FDA under an Emergency Use Authorization (EUA). This EUA will remain  in effect (meaning this test can be used) for the duration of the COVID-19 declaration under Section 56 4(b)(1) of the Act, 21 U.S.C. section 360bbb-3(b)(1), unless the authorization is terminated or revoked sooner. Performed at [facility], [place] [address], [place], [place] [address]      Radiology Studies: DG Shoulder Right  Result Date: 02/23/2020 CLINICAL DATA:  Right shoulder pain, fall EXAM: RIGHT SHOULDER - 2+ VIEW COMPARISON:  06/14/2019 FINDINGS: There is deformity of the proximal right humerus related to old healed fracture. Degenerative changes in the right AC and glenohumeral joints with joint space narrowing. No acute fracture, subluxation or dislocation. IMPRESSION: Old healed proximal right humeral fracture with deformity. No acute fracture. Electronically Signed   By: Rolm Baptise M.D.   On: 02/23/2020 20:00   CT HEAD WO CONTRAST  Result Date: 02/23/2020 CLINICAL DATA:  Patient drank 8 beers today and fell this morning. EXAM: CT HEAD WITHOUT CONTRAST TECHNIQUE: Contiguous axial images were obtained from the base of the skull through the vertex without intravenous contrast. COMPARISON:   01/17/2020 FINDINGS: Brain: No evidence of acute infarction, hemorrhage, hydrocephalus, extra-axial collection or mass lesion/mass effect. There is ventricular sulcal enlargement reflecting atrophy, advanced for age. Minor periventricular white matter hypoattenuation is also noted consistent with chronic microvascular ischemic change. Vascular: No hyperdense vessel or unexpected calcification. Skull: Normal. Negative for fracture or focal lesion. Sinuses/Orbits: Globes and orbits are unremarkable. Visualized sinuses are clear. Other: None. IMPRESSION: 1. No acute intracranial abnormalities. 2. Atrophy advanced for age. Minor chronic microvascular ischemic change. Electronically Signed   By: Lajean Manes M.D.   On: 02/23/2020 19:56   CT CERVICAL SPINE WO CONTRAST  Result Date: 02/23/2020 CLINICAL DATA:  Fall while intoxicated EXAM: CT CERVICAL SPINE WITHOUT CONTRAST TECHNIQUE: Multidetector CT imaging of the cervical spine was performed without intravenous contrast. Multiplanar CT image reconstructions were also generated. COMPARISON:  CT 10/30/2019 FINDINGS: Alignment: Straightening of normal cervical lordosis with slight focal reversal  at the C5-6 level. There is slight leftward rotation of the cranium with craniocervical atlantoaxial articulations limits for this degree of rotation. No abnormally widened, perched or jumped facets. Skull base and vertebrae: Fusion of the C5-6 vertebral bodies with chronic anterior wedging deformity of the C6 levels. Bony fusion across the left C4-5 articular facets right C5-6 articular facets. No acute fracture. No primary bone lesion or focal pathologic process. Soft tissues and spinal canal: No pre or paravertebral fluid or swelling. No visible canal hematoma. Disc levels: Multilevel intervertebral disc height loss with spondylitic endplate changes. Disc osteophyte complex at C5-6 and C6-C7 result in at most mild canal stenosis. Multilevel uncinate spurring and facet  hypertrophic changes result in multilevel neural foraminal narrowing as well. Upper chest: No acute abnormality in the upper chest or imaged lung apices. Other: Normal thyroid. Cervical carotid atherosclerosis. For findings within the head, please see dedicated CT of the head performed concurrently. IMPRESSION: 1. No acute fracture or malalignment of the cervical spine. 2. Fusion of the C5-6 vertebral bodies with chronic anterior wedging deformity of the C6 levels. 3. Multilevel degenerative disc disease and facet hypertrophic changes, detailed above. 4. Cervical carotid atherosclerosis. Electronically Signed   By: Lovena Le M.D.   On: 02/23/2020 20:06    Scheduled Meds: . atenolol  25 mg Oral BID  . baclofen  10 mg Oral TID  . feeding supplement (ENSURE ENLIVE)  237 mL Oral TID BM  . folic acid  1 mg Oral Daily  . multivitamin with minerals  1 tablet Oral Daily  . multivitamin with minerals  1 tablet Oral Daily  . pantoprazole  20 mg Oral Daily  . PARoxetine  30 mg Oral Daily  . senna-docusate  2 tablet Oral BID  . thiamine  100 mg Oral Daily   Continuous Infusions: . sodium chloride 125 mL/hr at 02/25/20 0834  . potassium PHOSPHATE IVPB (in mmol)       LOS: 2 days   Time spent: 40 minutes.  Lorella Nimrod, MD [facility]  If 7PM-7AM, please contact night-coverage [email or web address]  02/25/2020, 10:44 AM   This record has been created using Systems analyst. Errors have been sought and corrected,but may not always be located. Such creation errors do not reflect on the standard of care.

## 2020-02-25 NOTE — NC FL2 (Signed)
[place] LEVEL OF CARE SCREENING TOOL     IDENTIFICATION  Patient Name: Douglas Peterson. Birthdate: 1951/10/01 Sex: male Admission Date (Current Location): 02/23/2020  [facility] and [place] Number:  Engineering geologist and Address:         Provider Number: [id number]  Attending Physician Name and Address:  Lorella Nimrod, MD  Relative Name and Phone Number:       Current Level of Care: Hospital Recommended Level of Care: [place] Prior Approval Number:    Date Approved/Denied:   PASRR Number: waived  Discharge Plan: SNF    Current Diagnoses: Patient Active Problem List   Diagnosis Date Noted  . Protein-calorie malnutrition, severe 02/25/2020  . Left inguinal hernia 01/31/2020  . Encounter for competency evaluation   . GERD (gastroesophageal reflux disease) 01/17/2020  . Depression 01/17/2020  . Esophagitis   . Nausea without vomiting   . Weakness   . Tremor   . Pancytopenia ([place])   . Hypomagnesemia   . Alcoholic cirrhosis of liver without ascites ([place])   . Thrombocytopenia ([place])   . Non-traumatic rhabdomyolysis   . Alcohol withdrawal delirium ([place]) 12/08/2019  . Alcohol abuse   . Alcohol intoxication, uncomplicated ([place])   . 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[place]) 09/27/2019  . Hypokalemia 09/27/2019  . Dysphagia 09/27/2019  . Alcohol withdrawal delirium, acute, hyperactive ([place]) 09/21/2019  . Pressure injury of skin 08/15/2019  . Goals of care, counseling/discussion   . Palliative care by specialist   . Alcohol withdrawal ([place]) 03/20/2019  . Low back pain 11/15/2018  . Acute metabolic encephalopathy 09/32/3557  . Delirium tremens ([place]) 03/08/2018  . Malnutrition of moderate degree 11/24/2017  . HTN (hypertension) 09/16/2017  . Hypothermia 12/17/2016  . Alcohol use   . Diarrhea   . Hyponatremia 07/09/2015    Orientation RESPIRATION BLADDER Height & Weight     Self, Time, Situation, Place  Normal Continent Weight: 116 lb 13.5 oz (53 kg) Height:  5' 4"  (162.6 cm)  BEHAVIORAL SYMPTOMS/MOOD NEUROLOGICAL BOWEL NUTRITION STATUS      Continent    AMBULATORY STATUS COMMUNICATION OF NEEDS Skin   Limited Assist(moderate assist) Verbally Normal                       Personal Care Assistance Level of Assistance  Bathing, Dressing Bathing Assistance: Limited assistance   Dressing Assistance: Limited assistance     Functional Limitations Info             SPECIAL CARE FACTORS FREQUENCY  PT (By licensed PT), OT (By licensed OT)                    Contractures      Additional Factors Info  Code Status Code Status Info: FULL CODE             Current Medications (02/25/2020):  This is the current hospital active medication list Current Facility-Administered Medications  Medication Dose Route Frequency Provider Last Rate Last Admin  . 0.9 %  sodium chloride infusion   Intravenous Continuous Mansy, Arvella Merles, MD 125 mL/hr at 02/25/20 0834 New Bag at 02/25/20 0834  . acetaminophen (TYLENOL) tablet 650 mg  650 mg Oral Q6H PRN Mansy, Jan A, MD       Or  . acetaminophen (TYLENOL) suppository 650 mg  650 mg Rectal Q6H PRN Mansy, Jan A, MD      . atenolol (TENORMIN) tablet 25  mg  25 mg Oral BID Mansy, Jan A, MD   25 mg at 02/25/20 8756  . baclofen (LIORESAL) tablet 10 mg  10 mg Oral TID Mansy, Jan A, MD   10 mg at 02/25/20 4332  . feeding supplement (ENSURE ENLIVE) (ENSURE ENLIVE) liquid 237 mL  237 mL Oral TID BM Lorella Nimrod, MD   237 mL at 02/25/20 0832  . folic acid (FOLVITE) tablet 1 mg  1 mg Oral Daily Pernell Dupre, RPH   1 mg at 02/25/20 0826  . labetalol (NORMODYNE) injection 20 mg  20 mg Intravenous Q3H PRN Mansy, Jan A, MD      . LORazepam (ATIVAN) injection 1 mg  1 mg Intravenous Q1H PRN Mansy, Jan A, MD   1 mg at 02/24/20 1930  . magnesium sulfate IVPB 2 g 50 mL  2 g Intravenous Once Dallie Piles, RPH 50 mL/hr at 02/25/20 0835 2 g at 02/25/20 0835  .  multivitamin with minerals tablet 1 tablet  1 tablet Oral Daily Mansy, Jan A, MD   1 tablet at 02/25/20 0826  . ondansetron (ZOFRAN) tablet 4 mg  4 mg Oral Q6H PRN Mansy, Jan A, MD       Or  . ondansetron [facility]) injection 4 mg  4 mg Intravenous Q6H PRN Mansy, Jan A, MD   4 mg at 02/24/20 1930  . pantoprazole (PROTONIX) EC tablet 20 mg  20 mg Oral Daily Mansy, Jan A, MD   20 mg at 02/25/20 9518  . PARoxetine (PAXIL) tablet 30 mg  30 mg Oral Daily Mansy, Jan A, MD   30 mg at 02/25/20 8416  . potassium PHOSPHATE 20 mmol in dextrose 5 % 500 mL infusion  20 mmol Intravenous Once Dallie Piles, RPH      . senna-docusate (Senokot-S) tablet 2 tablet  2 tablet Oral BID Mansy, Arvella Merles, MD   2 tablet at 02/25/20 0826  . sucralfate (CARAFATE) tablet 1 g  1 g Oral QID PRN Mansy, Jan A, MD      . thiamine tablet 100 mg  100 mg Oral Daily Mansy, Jan A, MD   100 mg at 02/25/20 0826  . traZODone (DESYREL) tablet 25 mg  25 mg Oral QHS PRN Mansy, Jan A, MD   25 mg at 02/25/20 0117     Discharge Medications: Please see discharge summary for a list of discharge medications.  Relevant Imaging Results:  Relevant Lab Results:   Additional Information SS# [id number]  Amador Cunas, [place]

## 2020-02-25 NOTE — TOC Initial Note (Signed)
Transition of Care (TOC) - Initial/Assessment Note    Patient Details  Name: Douglas Peterson. MRN: [id number] Date of Birth: May 04, 1951  Transition of Care [facility]) CM/SW Contact:    Douglas Peterson, [place] Phone Number: 02/25/2020, 9:56 AM  Clinical Narrative:  SW spoke with pt re PT recommendation for SNF. OT eval pending. Pt from home alone. Previous SNF stay at [facility] and prefers this facility again. Reviewed SNF placement process and answered questions. Will f/u with offers once available. Pt will need insurance auth once facility chosen.   Douglas Peterson, MSW, LCSW [phone] (coverage)      Expected Discharge Plan: [place] Barriers to Discharge: Continued Medical Work up, Ship broker   Patient Goals and CMS Choice   CMS Medicare.gov Compare Post Acute Care list provided to:: Patient Choice offered to / list presented to : Patient  Expected Discharge Plan and Services Expected Discharge Plan: [place] arrangements for the past 2 months: Single Family Home                                      Prior Living Arrangements/Services Living arrangements for the past 2 months: Single Family Home Lives with:: Self Patient language and need for interpreter reviewed:: No          Care giver support system in place?: No (comment)   Criminal Activity/Legal Involvement Pertinent to Current Situation/Hospitalization: No - Comment as needed  Activities of Daily Living Home Assistive Devices/Equipment: Environmental consultant (specify type), Cane (specify quad or straight) ADL Screening (condition at time of admission) Patient's cognitive ability adequate to safely complete daily activities?: Yes Is the patient deaf or have difficulty hearing?: No Does the patient have difficulty seeing, even when wearing glasses/contacts?: No Does the patient have difficulty concentrating, remembering, or making decisions?:  No Patient able to express need for assistance with ADLs?: Yes Does the patient have difficulty dressing or bathing?: No Independently performs ADLs?: Yes (appropriate for developmental age) Does the patient have difficulty walking or climbing stairs?: Yes Weakness of Legs: Both Weakness of Arms/Hands: Both  Permission Sought/Granted Permission sought to share information with : Chartered certified accountant granted to share information with : Yes, Verbal Permission Granted              Emotional Assessment       Orientation: : Oriented to Self, Oriented to Place, Oriented to  Time, Oriented to Situation   Psych Involvement: No (comment)  Admission diagnosis:  Alcohol abuse [F10.10] Hyponatremia [E87.1] Weakness [R53.1] Left inguinal hernia [K40.90] Patient Active Problem List   Diagnosis Date Noted  . Protein-calorie malnutrition, severe 02/25/2020  . Left inguinal hernia 01/31/2020  . Encounter for competency evaluation   . GERD (gastroesophageal reflux disease) 01/17/2020  . Depression 01/17/2020  . Esophagitis   . Nausea without vomiting   . Weakness   . Tremor   . Pancytopenia ([place])   . Hypomagnesemia   . Alcoholic cirrhosis of liver without ascites ([place])   . Thrombocytopenia ([place])   . Non-traumatic rhabdomyolysis   . Alcohol withdrawal delirium ([place]) 12/08/2019  . Alcohol abuse   . Alcohol intoxication, uncomplicated ([place])   . 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[place]) 09/27/2019  . Hypokalemia 09/27/2019  . Dysphagia 09/27/2019  . Alcohol withdrawal delirium, acute, hyperactive ([place]) 09/21/2019  . Pressure injury of skin  08/15/2019  . Goals of care, counseling/discussion   . Palliative care by specialist   . Alcohol withdrawal ([place]) 03/20/2019  . Low back pain 11/15/2018  . Acute metabolic encephalopathy 96/28/3662  . Delirium tremens ([place]) 03/08/2018  . Malnutrition of moderate degree 11/24/2017  . HTN (hypertension)  09/16/2017  . Hypothermia 12/17/2016  . Alcohol use   . Diarrhea   . Hyponatremia 07/09/2015   PCP:  Douglas Kayser, MD Pharmacy:   [facility] [address], [place] - [place] [address] [place] [address] Phone: [phone] Fax: [phone]     Social Determinants of Health (SDOH) Interventions    Readmission Risk Interventions Readmission Risk Prevention Plan 12/13/2019 12/12/2019  Transportation Screening Complete Complete  PCP or Specialist Appt within 3-5 Days (No Data) -  HRI or [place] (No Data) -  Palliative Care Screening Complete Complete  Medication Review (RN Care Manager) Complete Complete  Some recent data might be hidden

## 2020-02-25 NOTE — Evaluation (Signed)
Occupational Therapy Evaluation Patient Details Name: Douglas Peterson. MRN: [id number] DOB: January 03, 1951 Today's Date: 02/25/2020    History of Present Illness 68yoM with multiple recent admits for similar.  He came from home w/ AMS, fall (hit head w/o LOC) and persistent nausea.  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   Clinical Impression   Pt was seen for OT evaluation this date. Prior to hospital admission, pt was MOD I with fxl mobility, MOD I with BADLs, and had assist from sister for IADLs. Pt lives alone. Currently pt demonstrates impairments as described below (See OT problem list) which functionally limit his ability to perform ADL/self-care tasks. Pt currently requires MIN/MOD A for ADL transfers and MIN/MOD A for LB ADLs.  Pt would benefit from skilled OT to address noted impairments and functional limitations (see below for any additional details) in order to maximize safety and independence while minimizing falls risk and caregiver burden. Upon hospital discharge, recommend STR to maximize pt safety and return to PLOF.     Follow Up Recommendations  SNF;Supervision - Intermittent    Equipment Recommendations  Other (comment)(defer to next level of care)    Recommendations for Other Services       Precautions / Restrictions Precautions Precautions: Fall Restrictions Weight Bearing Restrictions: No      Mobility Bed Mobility Overal bed mobility: Needs Assistance Bed Mobility: Supine to Sit;Sit to Supine     Supine to sit: Min assist Sit to supine: Min assist   General bed mobility comments: Min A to manage LEs.  Transfers Overall transfer level: Needs assistance Equipment used: Quad cane Transfers: Sit to/from Stand Sit to Stand: Min assist         General transfer comment: Cues for safe hand placement and to have feet on grouind prior to sit to stand    Balance Overall balance assessment:  Needs assistance Sitting-balance support: Single extremity supported Sitting balance-Leahy Scale: Fair     Standing balance support: Single extremity supported Standing balance-Leahy Scale: Poor Standing balance comment: requires MIN/MOD A and 1 UE support throughout standing to maintain static balance                           ADL either performed or assessed with clinical judgement   ADL                                         General ADL Comments: Requires MOD A for toileting, MIN A for seated UB ADLs, MOD A for LB dressing in sitting, MIN/MOD A for ADL transfers/fxl mobility with quad cane.     Vision Patient Visual Report: No change from baseline Additional Comments: pt appears to mostly track appropraitely and reports no changes.     Perception     Praxis      Pertinent Vitals/Pain Pain Assessment: Faces Faces Pain Scale: Hurts a little bit Pain Location: hips Pain Descriptors / Indicators: Dull;Sore Pain Intervention(s): Monitored during session     Hand Dominance Left   Extremity/Trunk Assessment Upper Extremity Assessment Upper Extremity Assessment: RUE deficits/detail;LUE deficits/detail RUE Deficits / Details: chronic flexor tone, elbow and digits flexed at all times, pt is able to relax and stretch joints when cognizant LUE Deficits / Details: WFL,  gross weakess, MMT grossly 4-/5   Lower  Extremity Assessment Lower Extremity Assessment: Defer to PT evaluation;Overall [facility] for tasks assessed(chronic R sided weakness)       Communication Communication Communication: No difficulties   Cognition Arousal/Alertness: Awake/alert Behavior During Therapy: WFL for tasks assessed/performed Overall Cognitive Status: Within Functional Limits for tasks assessed                                 General Comments: Pt appears somewhat lethargic at start of session, sitter reports having had medication about an hour prior that  may be contributing. But pt Mostly oriented (guesses march instead of april, but eventually gets correct month) knows year, knows city and hospital, but requires cues for name of hospital.   General Comments       Exercises Other Exercises Other Exercises: OT facilitates education on importance of OOB activity, but also of fall prevention as pt demos some impulsivity this date (has sitter). Pt with moderate reception, requires reinforcement.   Shoulder Instructions      Home Living Family/patient expects to be discharged to:: Private residence Living Arrangements: Alone Available Help at Discharge: Family(sister helps with errands/getting groceries.) Type of Home: House Home Access: Stairs to enter [employer] of Steps: 4 Entrance Stairs-Rails: Right;Left;Can reach both Home Layout: One level     Bathroom Shower/Tub: Teacher, early years/pre: [place] - single point;Cane - quad;Walker - 2 wheels;Tub bench;Grab bars - tub/shower   Additional Comments: RUE Resting hand splint - did use it in past - but not recently per pt      Prior Functioning/Environment Level of Independence: Independent with assistive device(s)        Comments: Pt report some days he uses cane and somedays he uses walker        OT Problem List: Decreased strength;Decreased range of motion;Decreased activity tolerance;Impaired balance (sitting and/or standing);Decreased coordination;Impaired tone;Impaired UE functional use      OT Treatment/Interventions: Self-care/ADL training;Therapeutic exercise;Energy conservation;DME and/or AE instruction;Therapeutic activities;Patient/family education;Balance training    OT Goals(Current goals can be found in the care plan section) Acute Rehab OT Goals Patient Stated Goal: Get back to walking well and living on his own OT Goal Formulation: With patient Time For Goal Achievement: 03/10/20 Potential to Achieve Goals:  Good  OT Frequency: Min 2X/week   Barriers to D/C:            Co-evaluation              AM-PAC OT "6 Clicks" Daily Activity     Outcome Measure Help from another person eating meals?: A Little Help from another person taking care of personal grooming?: A Little Help from another person toileting, which includes using toliet, bedpan, or urinal?: A Little Help from another person bathing (including washing, rinsing, drying)?: A Little Help from another person to put on and taking off regular upper body clothing?: A Little Help from another person to put on and taking off regular lower body clothing?: A Lot 6 Click Score: 17   End of Session Equipment Utilized During Treatment: Gait belt;Other (comment)(quad cane) Nurse Communication: Mobility status  Activity Tolerance: Patient tolerated treatment well Patient left: in bed;with call bell/phone within reach;with bed alarm set;with nursing/sitter in room  OT Visit Diagnosis: Unsteadiness on feet (R26.81);Muscle weakness (generalized) (M62.81);History of falling (Z91.81)                Time: 5284-1324  OT Time Calculation (min): 38 min Charges:  OT General Charges $OT Visit: 1 Visit OT Evaluation $OT Eval Moderate Complexity: 1 Mod OT Treatments $Self Care/Home Management : 8-22 mins $Therapeutic Activity: 8-22 mins  Gerrianne Scale, MS, OTR/L ascom [phone] 02/25/20, 4:56 PM

## 2020-02-26 LAB — CBC
HCT: 41.4 % (ref 39.0–52.0)
Hemoglobin: 14.6 g/dL (ref 13.0–17.0)
MCH: 31.7 pg (ref 26.0–34.0)
MCHC: 35.3 g/dL (ref 30.0–36.0)
MCV: 90 fL (ref 80.0–100.0)
Platelets: 64 10*3/uL — ABNORMAL LOW (ref 150–400)
RBC: 4.6 MIL/uL (ref 4.22–5.81)
RDW: 12.6 % (ref 11.5–15.5)
WBC: 5.5 10*3/uL (ref 4.0–10.5)
nRBC: 0 % (ref 0.0–0.2)

## 2020-02-26 LAB — FOLATE RBC
Folate, Hemolysate: 447 ng/mL
Folate, RBC: 1082 ng/mL (ref >498)
Hematocrit: 41.3 % (ref 37.5–51.0)

## 2020-02-26 LAB — GLUCOSE, CAPILLARY
Glucose-Capillary: 122 mg/dL — ABNORMAL HIGH (ref 70–99)
Glucose-Capillary: 110 mg/dL — ABNORMAL HIGH (ref 70–99)
Glucose-Capillary: 239 mg/dL — ABNORMAL HIGH (ref 70–99)
Glucose-Capillary: 107 mg/dL — ABNORMAL HIGH (ref 70–99)
Glucose-Capillary: 149 mg/dL — ABNORMAL HIGH (ref 70–99)

## 2020-02-26 LAB — MAGNESIUM: Magnesium: 2.1 mg/dL (ref 1.7–2.4)

## 2020-02-26 LAB — BASIC METABOLIC PANEL
Anion gap: 7 (ref 5–15)
BUN: 8 mg/dL (ref 8–23)
CO2: 26 mmol/L (ref 22–32)
Calcium: 9.2 mg/dL (ref 8.9–10.3)
Chloride: 103 mmol/L (ref 98–111)
Creatinine, Ser: 0.34 mg/dL — ABNORMAL LOW (ref 0.61–1.24)
GFR calc Af Amer: >60 mL/min (ref >60)
GFR calc non Af Amer: >60 mL/min (ref >60)
Glucose, Bld: 98 mg/dL (ref 70–99)
Potassium: 3.5 mmol/L (ref 3.5–5.1)
Sodium: 136 mmol/L (ref 135–145)

## 2020-02-26 LAB — PHOSPHORUS: Phosphorus: 3.5 mg/dL (ref 2.5–4.6)

## 2020-02-26 MED ORDER — INSULIN ASPART 100 UNIT/ML ~~LOC~~ SOLN
0.0000 [IU] | Freq: Three times a day (TID) | SUBCUTANEOUS | Status: DC
  Administered 2020-02-27: 09:00:00 1 [IU] via SUBCUTANEOUS
  Filled 2020-02-26 (×2): qty 1

## 2020-02-26 MED ORDER — INSULIN ASPART 100 UNIT/ML ~~LOC~~ SOLN: 0.0000 [IU] | Freq: Every day | SUBCUTANEOUS | Status: DC

## 2020-02-26 NOTE — Progress Notes (Signed)
PROGRESS NOTE    [name]  AJG:[id number] DOB: [birth year] DOA: 02/23/2020 PCP: [name], MD   Brief Narrative:  Douglas Peterson  is a 69 y.o. Caucasian male with a known history of alcohol abuse, hypertension and traumatic brain injury, who presented to the emergency room with acute onset of generalized weakness and subsequent fall with inability to get up and right shoulder pain.  He admitted to hitting his head when he fell without loss of consciousness.  He has been having nausea and vomiting as well as diarrhea per his report.  Not any abdominal pain.  Labs with mild hyponatremia.  Sodium of 129 and isolated thrombocytopenia of 73.  Imaging were negative for any acute injuries Lovenox.  Subjective: Patient was resting comfortably with sitter at bedside.  No new complaints.  No concern from sitter, stating that he is much less aggressive today.  Assessment & Plan:   Active Problems:   Hyponatremia   Protein-calorie malnutrition, severe  Hyponatremia.  Resolved  Patient did receive some normal saline in the ED.  He has very mild hyponatremia most likely secondary to beer potomania.  Appears dehydrated with elevated serum osmolality. -Discontinue with IV fluid-encouraged p.o. intake. -Continue to monitor.  Electrolyte abnormalities/refeeding syndrome.  Developed multiple electrolyte abnormalities including hypokalemia, hypomagnesemia and hypophosphatemia.  There is a concern of refeeding syndrome due to prior poor p.o. intake and alcohol abuse. -Replete electrolytes and closely monitor.  Generalized weakness and frequent falls.  Most likely secondary to alcohol intoxication.  Alcohol level markedly elevated. -PT is recommending SNF placement. -UA-no concern for infection, positive for ketones. -UDS-positive for benzo. -Education officer, museum to find him a bed at SNF.  Acute on chronic thrombocytopenia.  Platelets of 73 on admission which dropped to 49, started improving to 64 this  morning.  Had platelets above 200, 3 weeks ago when he was admitted for persistent nausea and dry heaving and there was some concern of metabolic encephalopathy which resolved pretty quickly.  Patient has an history of hepatic steatosis, no cirrhosis seen on CT scan.  No significant hepatic dysfunction.  Hep C and HIV negative, LDH and fibrinogen within normal limit.  No obvious bleeding. Seems like patient to have chronic intermittent thrombocytopenia since 2016. -Smear show inappropriately normal immature platelet fraction with no blasts or schistocytes. -Vitamin B12-within normal limit. -RBC folate levels pending. -Keep holding Lovenox. -Hematology consult-appreciate their recommendations, clinically thrombocytopenia is likely due to bone marrow suppression secondary to alcohol intoxication and they were recommending close monitoring at this time.  Alcohol abuse./Alcohol intoxication.  Alcohol level elevated at 207.  Patient drinks on a daily basis.  Denies any withdrawal which I doubt.  Appears little agitated. CIWA score of 3. -Continue with CIWA protocol with as needed Ativan. -Continue with vitamins, folic acid and thiamine. -I counseled him again regarding alcohol cessation.  Mildly elevated AST.  Some improvement with IV fluid. -Continue to monitor.  Hypertension.  Blood pressure mildly elevated. -Continue with home dose of Tenormin. -Continue to monitor. -As needed hydralazine.  Paroxysmal A. Fib.  Currently in sinus rhythm. -Not a good candidate for anticoagulation due to thrombocytopenia and risk of falls.  Objective: Vitals:   02/25/20 2240 02/26/20 0008 02/26/20 0738 02/26/20 1004  BP: (!) 148/85 (!) 147/76 (!) 151/81   Pulse: 80 (!) 56 60 80  Resp: 18 18 18    Temp: (!) 97.4 F (36.3 C)  97.7 F (36.5 C)   TempSrc:   Oral   SpO2: 99% 97%  94%   Weight:      Height:        Intake/Output Summary (Last 24 hours) at 02/26/2020 1053 Last data filed at 02/26/2020  1042 Gross per 24 hour  Intake 1044.31 ml  Output 500 ml  Net 544.31 ml   Filed Weights   02/23/20 1923  Weight: 53 kg    Examination:  General exam: Chronically ill-appearing, emaciated gentleman,appears  comfortable  Respiratory system: Clear to auscultation. Respiratory effort normal. Cardiovascular system: S1 & S2 heard, RRR. No JVD, murmurs, rubs, gallops or clicks. Gastrointestinal system: Soft, nontender, nondistended, bowel sounds positive. Central nervous system: Alert and oriented. No focal neurological deficits.right arm contractures. Extremities: No edema, no cyanosis, pulses intact and symmetrical. Psychiatry: Judgement and insight appear normal.    DVT prophylaxis: SCDs-Lovenox stopped due to worsening thrombocytopenia. Code Status: Full Family Communication:  Daughter was updated on phone.  Disposition Plan:  Status is: Inpatient  Remains inpatient appropriate because:Unsafe d/c plan   Dispo: The patient is from: Home              Anticipated d/c is to: SNF              Anticipated d/c date is: 1-2 days.              Patient currently is not medically stable to d/c.  Pending bed availability and insurance authorization for SNF.  Might not occur over the weekend.  Consultants:   Hematology  Procedures:  Antimicrobials:   Data Reviewed: I have personally reviewed following labs and imaging studies  CBC: Recent Labs  Lab 02/23/20 2028 02/24/20 0532 02/25/20 0606 02/26/20 0633  WBC 8.8 6.3 5.3 5.5  NEUTROABS 2.9  --   --   --   HGB 14.9 14.0 13.6 14.6  HCT 42.6 39.8 38.0* 41.4  MCV 89.1 89.2 88.2 90.0  PLT 73* 59* 49* 64*   Basic Metabolic Panel: Recent Labs  Lab 02/23/20 2028 02/24/20 0532 02/25/20 0606 02/26/20 0633  NA 129* 137 135 136  K 3.9 3.6 3.0* 3.5  CL 88* 101 99 103  CO2 26 27 28 26   GLUCOSE 109* 77 122* 98  BUN <5* <5* 7* 8  CREATININE 0.39* 0.38* 0.40* 0.34*  CALCIUM 8.2* 7.6* 8.6* 9.2  MG  --  1.8 1.5* 2.1  PHOS  --    --  2.4* 3.5   GFR: Estimated Creatinine Clearance: 66.3 mL/min (A) (by C-G formula based on SCr of 0.34 mg/dL (L)). Liver Function Tests: Recent Labs  Lab 02/23/20 2028 02/24/20 0532 02/25/20 0606  AST 64* 53* 66*  ALT 37 33 38  ALKPHOS 99 83 117  BILITOT 1.0 1.2 1.6*  PROT 7.2 6.3* 6.8  ALBUMIN 4.2 3.6 3.8   No results for input(s): LIPASE, AMYLASE in the last 168 hours. No results for input(s): AMMONIA in the last 168 hours. Coagulation Profile: Recent Labs  Lab 02/24/20 0801  INR 1.2   Cardiac Enzymes: No results for input(s): CKTOTAL, CKMB, CKMBINDEX, TROPONINI in the last 168 hours. BNP (last 3 results) No results for input(s): PROBNP in the last 8760 hours. HbA1C: No results for input(s): HGBA1C in the last 72 hours. CBG: Recent Labs  Lab 02/25/20 0739 02/25/20 1143 02/25/20 1700 02/25/20 2008 02/26/20 0459  GLUCAP 122* 185* 140* 103* 122*   Lipid Profile: No results for input(s): CHOL, HDL, LDLCALC, TRIG, CHOLHDL, LDLDIRECT in the last 72 hours. Thyroid Function Tests: No results for input(s): TSH, T4TOTAL, FREET4,  T3FREE, THYROIDAB in the last 72 hours. Anemia Panel: Recent Labs    02/24/20 0732  VITAMINB12 612   Sepsis Labs: No results for input(s): PROCALCITON, LATICACIDVEN in the last 168 hours.  Recent Results (from the past 240 hour(s))  SARS CORONAVIRUS 2 (TAT 6-24 HRS) Nasopharyngeal Nasopharyngeal Swab     Status: None   Collection Time: 02/23/20 11:49 PM   Specimen: Nasopharyngeal Swab  Result Value Ref Range Status   SARS Coronavirus 2 NEGATIVE NEGATIVE Final    Comment: (NOTE) SARS-CoV-2 target nucleic acids are NOT DETECTED. The SARS-CoV-2 RNA is generally detectable in upper and lower respiratory specimens during the acute phase of infection. Negative results do not preclude SARS-CoV-2 infection, do not rule out co-infections with other pathogens, and should not be used as the sole basis for treatment or other patient management  decisions. Negative results must be combined with clinical observations, patient history, and epidemiological information. The expected result is Negative. Fact Sheet for Patients: SugarRoll.be Fact Sheet for Healthcare Providers: https://www.woods-mathews.com/ This test is not yet approved or cleared by the [place] FDA and  has been authorized for detection and/or diagnosis of SARS-CoV-2 by FDA under an Emergency Use Authorization (EUA). This EUA will remain  in effect (meaning this test can be used) for the duration of the COVID-19 declaration under Section 56 4(b)(1) of the Act, 21 U.S.C. section 360bbb-3(b)(1), unless the authorization is terminated or revoked sooner. Performed at [facility], [place] [address], [place], [place] [address]      Radiology Studies: No results found.  Scheduled Meds: . atenolol  25 mg Oral BID  . baclofen  10 mg Oral TID  . feeding supplement (ENSURE ENLIVE)  237 mL Oral TID BM  . folic acid  1 mg Oral Daily  . multivitamin with minerals  1 tablet Oral Daily  . pantoprazole  20 mg Oral Daily  . PARoxetine  30 mg Oral Daily  . senna-docusate  2 tablet Oral BID  . thiamine  100 mg Oral Daily   Continuous Infusions:    LOS: 3 days   Time spent: 35 minutes.  Lorella Nimrod, MD [facility]  If 7PM-7AM, please contact night-coverage [email or web address]  02/26/2020, 10:53 AM   This record has been created using Systems analyst. Errors have been sought and corrected,but may not always be located. Such creation errors do not reflect on the standard of care.

## 2020-02-26 NOTE — Consult Note (Signed)
[place] for Electrolyte Monitoring and Replacement   Recent Labs: Potassium (mmol/L)  Date Value  02/26/2020 3.5   Magnesium (mg/dL)  Date Value  02/26/2020 2.1   Calcium (mg/dL)  Date Value  02/26/2020 9.2   Albumin (g/dL)  Date Value  02/25/2020 3.8   Phosphorus (mg/dL)  Date Value  02/26/2020 3.5   Sodium (mmol/L)  Date Value  02/26/2020 136   Corrected Ca: 8.76 mg/dL  Assessment: 69 y.o.Caucasian malewith a known history of alcohol abuse, hypertension and traumatic brain injury with likely refeeding syndrome  Goal of Therapy:  Electrolytes WNL  Plan:  K 3.5  Mag 2.1  Phos 3.5  Scr 0.34 No supplementation at this time  Follow-up electrolytes in am 4/26  Noralee Space ,PharmD Clinical Pharmacist 02/26/2020 9:20 AM

## 2020-02-26 NOTE — Progress Notes (Signed)
Patient alert most of the shift. Oriented to self and place. Patient has been calm and cooperative with care. CIWA scores below 4.  Per sitter, patient is able to verbalize needs and he is following commands. MD Reesa Chew made aware and verbal order given to discontinue sitter 1:1.

## 2020-02-27 LAB — COMPREHENSIVE METABOLIC PANEL
ALT: 48 U/L — ABNORMAL HIGH (ref 0–44)
AST: 55 U/L — ABNORMAL HIGH (ref 15–41)
Albumin: 3.9 g/dL (ref 3.5–5.0)
Alkaline Phosphatase: 108 U/L (ref 38–126)
Anion gap: 8 (ref 5–15)
BUN: 20 mg/dL (ref 8–23)
CO2: 27 mmol/L (ref 22–32)
Calcium: 9.5 mg/dL (ref 8.9–10.3)
Chloride: 103 mmol/L (ref 98–111)
Creatinine, Ser: 0.47 mg/dL — ABNORMAL LOW (ref 0.61–1.24)
GFR calc Af Amer: >60 mL/min (ref >60)
GFR calc non Af Amer: >60 mL/min (ref >60)
Glucose, Bld: 89 mg/dL (ref 70–99)
Potassium: 4 mmol/L (ref 3.5–5.1)
Sodium: 138 mmol/L (ref 135–145)
Total Bilirubin: 1 mg/dL (ref 0.3–1.2)
Total Protein: 7.2 g/dL (ref 6.5–8.1)

## 2020-02-27 LAB — GLUCOSE, CAPILLARY
Glucose-Capillary: 127 mg/dL — ABNORMAL HIGH (ref 70–99)
Glucose-Capillary: 158 mg/dL — ABNORMAL HIGH (ref 70–99)
Glucose-Capillary: 122 mg/dL — ABNORMAL HIGH (ref 70–99)

## 2020-02-27 LAB — MAGNESIUM: Magnesium: 1.9 mg/dL (ref 1.7–2.4)

## 2020-02-27 LAB — CBC
HCT: 43.7 % (ref 39.0–52.0)
Hemoglobin: 14.9 g/dL (ref 13.0–17.0)
MCH: 31.3 pg (ref 26.0–34.0)
MCHC: 34.1 g/dL (ref 30.0–36.0)
MCV: 91.8 fL (ref 80.0–100.0)
Platelets: 92 10*3/uL — ABNORMAL LOW (ref 150–400)
RBC: 4.76 MIL/uL (ref 4.22–5.81)
RDW: 12.6 % (ref 11.5–15.5)
WBC: 6.7 10*3/uL (ref 4.0–10.5)
nRBC: 0 % (ref 0.0–0.2)

## 2020-02-27 LAB — PHOSPHORUS: Phosphorus: 3.8 mg/dL (ref 2.5–4.6)

## 2020-02-27 NOTE — Care Management Important Message (Signed)
Important Message  Patient Details  Name: Douglas Peterson. MRN: [id number] Date of Birth: [birth year]   Medicare Important Message Given:  Yes     Juliann Pulse A  02/27/2020, 11:55 AM

## 2020-02-27 NOTE — Consult Note (Signed)
[place] for Electrolyte Monitoring and Replacement   Recent Labs: Potassium (mmol/L)  Date Value  02/27/2020 4.0   Magnesium (mg/dL)  Date Value  02/27/2020 1.9   Calcium (mg/dL)  Date Value  02/27/2020 9.5   Albumin (g/dL)  Date Value  02/27/2020 3.9   Phosphorus (mg/dL)  Date Value  02/27/2020 3.8   Sodium (mmol/L)  Date Value  02/27/2020 138   Corrected Ca: 8.76 mg/dL  Assessment: 69 y.o.Caucasian malewith a known history of alcohol abuse, hypertension and traumatic brain injury with likely refeeding syndrome  Goal of Therapy:  Electrolytes WNL  Plan:  K 4.0  Mag 1.9  Phos 3.8  Scr 0.47 No supplementation at this time Follow-up electrolytes in am and continue to replace as needed.   Pernell Dupre, PharmD, BCPS Clinical Pharmacist 02/27/2020 10:27 AM

## 2020-02-27 NOTE — Progress Notes (Signed)
PROGRESS NOTE    Douglas Peterson.  MHD:[id number] DOB: 19-Aug-1951 DOA: 02/23/2020 PCP: [name], MD   Brief Narrative:  Douglas Peterson  is a 69 y.o. Caucasian male with a known history of alcohol abuse, hypertension and traumatic brain injury, who presented to the emergency room with acute onset of generalized weakness and subsequent fall with inability to get up and right shoulder pain.  He admitted to hitting his head when he fell without loss of consciousness.  He has been having nausea and vomiting as well as diarrhea per his report.  Not any abdominal pain.  Labs with mild hyponatremia.  Sodium of 129 and isolated thrombocytopenia of 73.  Imaging were negative for any acute injuries Lovenox.  Subjective: Patient was sitting comfortably in the chair when seen today.  No new complaints.  Per patient he will try his best to quit now as he does not want to keep repeating the cycle.  Assessment & Plan:   Active Problems:   Hyponatremia   Protein-calorie malnutrition, severe  Hyponatremia.  Resolved  Patient did receive some normal saline in the ED.  He has very mild hyponatremia most likely secondary to beer potomania.  Appears dehydrated with elevated serum osmolality. -Discontinue with IV fluid-encouraged p.o. intake. -Continue to monitor.  Electrolyte abnormalities/refeeding syndrome.  Developed multiple electrolyte abnormalities including hypokalemia, hypomagnesemia and hypophosphatemia.  There is a concern of refeeding syndrome due to prior poor p.o. intake and alcohol abuse.  Electrolyte abnormalities mostly resolved now. -Replete electrolytes as needed and closely monitor.  Generalized weakness and frequent falls.  Most likely secondary to alcohol intoxication.  Alcohol level markedly elevated. -PT is recommending SNF placement. -UA-no concern for infection, positive for ketones. -UDS-positive for benzo. -Education officer, museum to find him a bed at SNF.  Acute on chronic  thrombocytopenia.  Platelets of 73 on admission which dropped to 49, started improving to 92 this morning.  Had platelets above 200, 3 weeks ago when he was admitted for persistent nausea and dry heaving and there was some concern of metabolic encephalopathy which resolved pretty quickly.  Patient has an history of hepatic steatosis, no cirrhosis seen on CT scan.  No significant hepatic dysfunction.  Hep C and HIV negative, LDH and fibrinogen within normal limit.  No obvious bleeding. Seems like patient to have chronic intermittent thrombocytopenia since 2016. -Smear show inappropriately normal immature platelet fraction with no blasts or schistocytes. -Vitamin B12-within normal limit. -RBC folate -normal -Keep holding Lovenox. -Hematology consult-appreciate their recommendations, clinically thrombocytopenia is likely due to bone marrow suppression secondary to alcohol intoxication and they were recommending close monitoring at this time.  Alcohol abuse./Alcohol intoxication.  Alcohol level elevated at 207.  Patient drinks on a daily basis.  Denies any withdrawal which I doubt.  Appears little agitated. CIWA score of 3. -Continue with CIWA protocol with as needed Ativan. -Continue with vitamins, folic acid and thiamine. -I counseled him again regarding alcohol cessation.  Mildly elevated AST.  Some improvement with IV fluid. -Continue to monitor.  Hypertension.  Blood pressure mildly elevated. -Continue with home dose of Tenormin. -Continue to monitor. -As needed hydralazine.  Paroxysmal A. Fib.  Currently in sinus rhythm. -Not a good candidate for anticoagulation due to thrombocytopenia and risk of falls.  Objective: Vitals:   02/26/20 1606 02/26/20 2255 02/26/20 2356 02/27/20 0909  BP: 126/84 (!) 150/73 (!) 154/78 (!) 152/82  Pulse: 64 64 64 63  Resp:   17 18  Temp: 98.1 F (36.7 C)  98.1 F (36.7 C) 98.4 F (36.9 C)  TempSrc: Oral     SpO2: 100%  99% 96%  Weight:      Height:         Intake/Output Summary (Last 24 hours) at 02/27/2020 1508 Last data filed at 02/27/2020 1353 Gross per 24 hour  Intake 1557 ml  Output 250 ml  Net 1307 ml   Filed Weights   02/23/20 1923  Weight: 53 kg    Examination:  General exam: Chronically ill-appearing, emaciated gentleman,appears  comfortable  Respiratory system: Clear to auscultation. Respiratory effort normal. Cardiovascular system: S1 & S2 heard, RRR. No JVD, murmurs, rubs, gallops or clicks. Gastrointestinal system: Soft, nontender, nondistended, bowel sounds positive. Central nervous system: Alert and oriented. No focal neurological deficits.right arm contractures. Extremities: No edema, no cyanosis, pulses intact and symmetrical. Psychiatry: Judgement and insight appear normal.    DVT prophylaxis: SCDs-Lovenox stopped due to worsening thrombocytopenia. Code Status: Full Family Communication:  Daughter was updated on phone.  Disposition Plan:  Status is: Inpatient  Remains inpatient appropriate because:Unsafe d/c plan   Dispo: The patient is from: Home              Anticipated d/c is to: SNF              Anticipated d/c date is: 1-2 days.              Patient currently medically stable for discharge.  Pending bed availability and insurance authorization for SNF.   Consultants:   Hematology  Procedures:  Antimicrobials:   Data Reviewed: I have personally reviewed following labs and imaging studies  CBC: Recent Labs  Lab 02/23/20 2028 02/23/20 2028 02/24/20 0532 02/24/20 0801 02/25/20 0606 02/26/20 1914 02/27/20 0554  WBC 8.8  --  6.3  --  5.3 5.5 6.7  NEUTROABS 2.9  --   --   --   --   --   --   HGB 14.9  --  14.0  --  13.6 14.6 14.9  HCT 42.6   < > 39.8 41.3 38.0* 41.4 43.7  MCV 89.1  --  89.2  --  88.2 90.0 91.8  PLT 73*  --  59*  --  49* 64* 92*   < > = values in this interval not displayed.   Basic Metabolic Panel: Recent Labs  Lab 02/23/20 2028 02/24/20 0532 02/25/20 0606  02/26/20 0633 02/27/20 0554  NA 129* 137 135 136 138  K 3.9 3.6 3.0* 3.5 4.0  CL 88* 101 99 103 103  CO2 26 27 28 26 27   GLUCOSE 109* 77 122* 98 89  BUN <5* <5* 7* 8 20  CREATININE 0.39* 0.38* 0.40* 0.34* 0.47*  CALCIUM 8.2* 7.6* 8.6* 9.2 9.5  MG  --  1.8 1.5* 2.1 1.9  PHOS  --   --  2.4* 3.5 3.8   GFR: Estimated Creatinine Clearance: 66.3 mL/min (A) (by C-G formula based on SCr of 0.47 mg/dL (L)). Liver Function Tests: Recent Labs  Lab 02/23/20 2028 02/24/20 0532 02/25/20 0606 02/27/20 0554  AST 64* 53* 66* 55*  ALT 37 33 38 48*  ALKPHOS 99 83 117 108  BILITOT 1.0 1.2 1.6* 1.0  PROT 7.2 6.3* 6.8 7.2  ALBUMIN 4.2 3.6 3.8 3.9   No results for input(s): LIPASE, AMYLASE in the last 168 hours. No results for input(s): AMMONIA in the last 168 hours. Coagulation Profile: Recent Labs  Lab 02/24/20 0801  INR 1.2   Cardiac  Enzymes: No results for input(s): CKTOTAL, CKMB, CKMBINDEX, TROPONINI in the last 168 hours. BNP (last 3 results) No results for input(s): PROBNP in the last 8760 hours. HbA1C: No results for input(s): HGBA1C in the last 72 hours. CBG: Recent Labs  Lab 02/26/20 0712 02/26/20 1153 02/26/20 1603 02/26/20 2152 02/27/20 0800  GLUCAP 110* 239* 107* 149* 127*   Lipid Profile: No results for input(s): CHOL, HDL, LDLCALC, TRIG, CHOLHDL, LDLDIRECT in the last 72 hours. Thyroid Function Tests: No results for input(s): TSH, T4TOTAL, FREET4, T3FREE, THYROIDAB in the last 72 hours. Anemia Panel: No results for input(s): VITAMINB12, FOLATE, FERRITIN, TIBC, IRON, RETICCTPCT in the last 72 hours. Sepsis Labs: No results for input(s): PROCALCITON, LATICACIDVEN in the last 168 hours.  Recent Results (from the past 240 hour(s))  SARS CORONAVIRUS 2 (TAT 6-24 HRS) Nasopharyngeal Nasopharyngeal Swab     Status: None   Collection Time: 02/23/20 11:49 PM   Specimen: Nasopharyngeal Swab  Result Value Ref Range Status   SARS Coronavirus 2 NEGATIVE NEGATIVE Final     Comment: (NOTE) SARS-CoV-2 target nucleic acids are NOT DETECTED. The SARS-CoV-2 RNA is generally detectable in upper and lower respiratory specimens during the acute phase of infection. Negative results do not preclude SARS-CoV-2 infection, do not rule out co-infections with other pathogens, and should not be used as the sole basis for treatment or other patient management decisions. Negative results must be combined with clinical observations, patient history, and epidemiological information. The expected result is Negative. Fact Sheet for Patients: SugarRoll.be Fact Sheet for Healthcare Providers: https://www.woods-mathews.com/ This test is not yet approved or cleared by the [place] FDA and  has been authorized for detection and/or diagnosis of SARS-CoV-2 by FDA under an Emergency Use Authorization (EUA). This EUA will remain  in effect (meaning this test can be used) for the duration of the COVID-19 declaration under Section 56 4(b)(1) of the Act, 21 U.S.C. section 360bbb-3(b)(1), unless the authorization is terminated or revoked sooner. Performed at [facility], [place] [address] [place], [place] [address]      Radiology Studies: No results found.  Scheduled Meds: . atenolol  25 mg Oral BID  . baclofen  10 mg Oral TID  . feeding supplement (ENSURE ENLIVE)  237 mL Oral TID BM  . folic acid  1 mg Oral Daily  . insulin aspart  0-5 Units Subcutaneous QHS  . insulin aspart  0-9 Units Subcutaneous TID WC  . multivitamin with minerals  1 tablet Oral Daily  . pantoprazole  20 mg Oral Daily  . PARoxetine  30 mg Oral Daily  . senna-docusate  2 tablet Oral BID  . thiamine  100 mg Oral Daily   Continuous Infusions:    LOS: 4 days   Time spent: 35 minutes.  Lorella Nimrod, MD [facility]  If 7PM-7AM, please contact night-coverage [email or web address]  02/27/2020, 3:08 PM   This record has been created using Therapist, sports. Errors have been sought and corrected,but may not always be located. Such creation errors do not reflect on the standard of care.

## 2020-02-27 NOTE — Progress Notes (Signed)
Physical Therapy Treatment Patient Details Name: Douglas Peterson. MRN: [id number] DOB: 25-Apr-1951 Today's Date: 02/27/2020    History of Present Illness 68yoM with multiple recent admits for similar.  He came from home w/ AMS, fall (hit head w/o LOC) and persistent nausea.  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    PT Comments    Pt reports some nausea and R arm pain but overall improved.  To EOB with mod a x 1.  Once sitting, generally steady.  Stood with min/mod a x 1 on first attempt with LE's supported on back on bed initially but with time, he is able to step in place.  Seated rest before transferring to recliner with min a x 1 and QC.  Pt with poor confidence asking for chair to be placed adjacent to bed so he didn't have to step as far to it.  Pt pleased with being out of bed but disappointed he is not able to progress gait due to fatigue.  Remained in recliner after session.   Follow Up Recommendations  SNF     Equipment Recommendations       Recommendations for Other Services       Precautions / Restrictions Precautions Precautions: Fall Restrictions Weight Bearing Restrictions: No    Mobility  Bed Mobility Overal bed mobility: Needs Assistance Bed Mobility: Supine to Sit     Supine to sit: Min assist        Transfers Overall transfer level: Needs assistance Equipment used: Quad cane Transfers: Sit to/from Stand Sit to Stand: Min assist;Mod assist         General transfer comment: initially unsteady and uses bed for support but improves with time  Ambulation/Gait Ambulation/Gait assistance: Min assist Gait Distance (Feet): 3 Feet Assistive device: Quad cane   Gait velocity: decreased   General Gait Details: able to take small marches in place then turn to recliner.  Prefers SPC vs QC for mobility   Stairs             Wheelchair Mobility    Modified Rankin (Stroke Patients  Only)       Balance Overall balance assessment: Needs assistance Sitting-balance support: Single extremity supported Sitting balance-Leahy Scale: Fair     Standing balance support: Single extremity supported Standing balance-Leahy Scale: Poor Standing balance comment: requires MIN/MOD A and 1 UE support throughout standing to maintain static balance                            Cognition Arousal/Alertness: Awake/alert Behavior During Therapy: WFL for tasks assessed/performed Overall Cognitive Status: Within Functional Limits for tasks assessed                                        Exercises      General Comments        Pertinent Vitals/Pain Pain Assessment: Faces Faces Pain Scale: Hurts little more Pain Location: R shoulder Pain Descriptors / Indicators: Dull;Sore;Grimacing Pain Intervention(s): Limited activity within patient's tolerance;Monitored during session;Repositioned    Home Living                      Prior Function            PT Goals (current goals can now be found in the care  plan section) Progress towards PT goals: Progressing toward goals    Frequency    Min 2X/week      PT Plan Current plan remains appropriate    Co-evaluation              AM-PAC PT "6 Clicks" Mobility   Outcome Measure  Help needed turning from your back to your side while in a flat bed without using bedrails?: A Little Help needed moving from lying on your back to sitting on the side of a flat bed without using bedrails?: A Little Help needed moving to and from a bed to a chair (including a wheelchair)?: A Lot Help needed standing up from a chair using your arms (e.g., wheelchair or bedside chair)?: A Little Help needed to walk in hospital room?: A Lot Help needed climbing 3-5 steps with a railing? : Total 6 Click Score: 14    End of Session Equipment Utilized During Treatment: Gait belt Activity Tolerance: Patient  tolerated treatment well;Patient limited by fatigue Patient left: with chair alarm set;with call bell/phone within reach Nurse Communication: Mobility status       Time: 8377-9396 PT Time Calculation (min) (ACUTE ONLY): 12 min  Charges:  $Therapeutic Activity: 8-22 mins                     Chesley Noon, PTA 02/27/20, 10:45 AM

## 2020-02-28 LAB — SARS CORONAVIRUS 2 (TAT 6-24 HRS): SARS Coronavirus 2: NEGATIVE

## 2020-02-28 LAB — GLUCOSE, CAPILLARY
Glucose-Capillary: 86 mg/dL (ref 70–99)
Glucose-Capillary: 95 mg/dL (ref 70–99)
Glucose-Capillary: 114 mg/dL — ABNORMAL HIGH (ref 70–99)

## 2020-02-28 LAB — MAGNESIUM: Magnesium: 1.9 mg/dL (ref 1.7–2.4)

## 2020-02-28 LAB — CBC
HCT: 43.3 % (ref 39.0–52.0)
Hemoglobin: 14.7 g/dL (ref 13.0–17.0)
MCH: 31.7 pg (ref 26.0–34.0)
MCHC: 33.9 g/dL (ref 30.0–36.0)
MCV: 93.5 fL (ref 80.0–100.0)
Platelets: 120 K/uL — ABNORMAL LOW (ref 150–400)
RBC: 4.63 MIL/uL (ref 4.22–5.81)
RDW: 13 % (ref 11.5–15.5)
WBC: 7.8 K/uL (ref 4.0–10.5)
nRBC: 0 % (ref 0.0–0.2)

## 2020-02-28 LAB — PHOSPHORUS: Phosphorus: 3.7 mg/dL (ref 2.5–4.6)

## 2020-02-28 LAB — POTASSIUM: Potassium: 3.7 mmol/L (ref 3.5–5.1)

## 2020-02-28 MED ORDER — POTASSIUM CHLORIDE CRYS ER 20 MEQ PO TBCR
20.0000 meq | EXTENDED_RELEASE_TABLET | Freq: Once | ORAL | Status: AC
  Administered 2020-02-28: 20 meq via ORAL
  Filled 2020-02-28: qty 1

## 2020-02-28 NOTE — Progress Notes (Signed)
PROGRESS NOTE    [name]  GMW:[id number] DOB: 07-04-1951 DOA: 02/23/2020 PCP: [name], MD   Brief Narrative:  Douglas Peterson  is a 69 y.o. Caucasian male with a known history of alcohol abuse, hypertension and traumatic brain injury, who presented to the emergency room with acute onset of generalized weakness and subsequent fall with inability to get up and right shoulder pain.  He admitted to hitting his head when he fell without loss of consciousness.  He has been having nausea and vomiting as well as diarrhea per his report.  Not any abdominal pain.  Labs with mild hyponatremia.  Sodium of 129 and isolated thrombocytopenia of 73.  Imaging were negative for any acute injuries Lovenox.  Subjective: Patient has no new complaints today.  Assessment & Plan:   Active Problems:   Hyponatremia   Protein-calorie malnutrition, severe  Hyponatremia.  Resolved  Patient did receive some normal saline in the ED.  He has very mild hyponatremia most likely secondary to beer potomania.  Appears dehydrated with elevated serum osmolality. -Continue to monitor.  Electrolyte abnormalities/refeeding syndrome.  Developed multiple electrolyte abnormalities including hypokalemia, hypomagnesemia and hypophosphatemia.  There is a concern of refeeding syndrome due to prior poor p.o. intake and alcohol abuse.  Electrolyte abnormalities mostly resolved now. -Replete electrolytes as needed and closely monitor.  Generalized weakness and frequent falls.  Most likely secondary to alcohol intoxication.  Alcohol level markedly elevated. -PT is recommending SNF placement. -UA-no concern for infection, positive for ketones. -UDS-positive for benzo. -Education officer, museum to find him a bed at [facility] a bed offer waiting for insurance authorization.  Acute on chronic thrombocytopenia.  Platelets of 73 on admission which dropped to 49, started improving to 120 this morning.  Had platelets above 200, 3 weeks ago when  he was admitted for persistent nausea and dry heaving and there was some concern of metabolic encephalopathy which resolved pretty quickly.  Patient has an history of hepatic steatosis, no cirrhosis seen on CT scan.  No significant hepatic dysfunction.  Hep C and HIV negative, LDH and fibrinogen within normal limit.  No obvious bleeding. Seems like patient to have chronic intermittent thrombocytopenia since 2016. -Smear show inappropriately normal immature platelet fraction with no blasts or schistocytes. -Vitamin B12-within normal limit. -RBC folate -normal -Keep holding Lovenox. -Hematology consult-appreciate their recommendations, clinically thrombocytopenia is likely due to bone marrow suppression secondary to alcohol intoxication and they were recommending close monitoring at this time.  Alcohol abuse./Alcohol intoxication.  Alcohol level elevated at 207.  Patient drinks on a daily basis.  Denies any withdrawal which I doubt.  Appears little agitated. CIWA score of 0 today. -Continue with CIWA protocol  -Continue with vitamins, folic acid and thiamine. -I counseled him again regarding alcohol cessation.  Mildly elevated AST.  Some improvement with IV fluid. -Continue to monitor.  Hypertension.  Blood pressure within goal. -Continue with home dose of Tenormin. -Continue to monitor. -As needed hydralazine.  Paroxysmal A. Fib.  Currently in sinus rhythm. -Not a good candidate for anticoagulation due to thrombocytopenia and risk of falls.  Objective: Vitals:   02/27/20 1737 02/27/20 2249 02/28/20 0044 02/28/20 0815  BP: 116/87 134/82 138/90 138/87  Pulse: 78 72 67 68  Resp: 20  18 16   Temp: 98.2 F (36.8 C)  97.8 F (36.6 C) 98.3 F (36.8 C)  TempSrc: Oral  Oral Oral  SpO2: 97%  95%   Weight:      Height:  Intake/Output Summary (Last 24 hours) at 02/28/2020 1054 Last data filed at 02/28/2020 0900 Gross per 24 hour  Intake 717 ml  Output 800 ml  Net -83 ml   Filed  Weights   02/23/20 1923  Weight: 53 kg    Examination:  General exam: Chronically ill-appearing, emaciated gentleman,appears  comfortable  Respiratory system: Clear to auscultation. Respiratory effort normal. Cardiovascular system: S1 & S2 heard, RRR. No JVD, murmurs, rubs, gallops or clicks. Gastrointestinal system: Soft, nontender, nondistended, bowel sounds positive. Central nervous system: Alert and oriented. No focal neurological deficits.right arm contractures. Extremities: No edema, no cyanosis, pulses intact and symmetrical. Psychiatry: Judgement and insight appear normal.    DVT prophylaxis: SCDs-Lovenox stopped due to worsening thrombocytopenia. Code Status: Full Family Communication:  Daughter was updated on phone.  Disposition Plan:  Status is: Inpatient  Remains inpatient appropriate because:Unsafe d/c plan   Dispo: The patient is from: Home              Anticipated d/c is to: SNF              Anticipated d/c date is: 1-2 days.              Patient currently medically stable for discharge.  Had a bed offer and waiting for insurance authorization.  Covid test ordered.  Per case manager previously it took many days because of his insurance.  Consultants:   Hematology  Procedures:  Antimicrobials:   Data Reviewed: I have personally reviewed following labs and imaging studies  CBC: Recent Labs  Lab 02/23/20 2028 02/23/20 2028 02/24/20 0532 02/24/20 0801 02/25/20 0606 02/26/20 9323 02/27/20 0554 02/28/20 0509  WBC 8.8   < > 6.3  --  5.3 5.5 6.7 7.8  NEUTROABS 2.9  --   --   --   --   --   --   --   HGB 14.9   < > 14.0  --  13.6 14.6 14.9 14.7  HCT 42.6   < > 39.8 41.3 38.0* 41.4 43.7 43.3  MCV 89.1   < > 89.2  --  88.2 90.0 91.8 93.5  PLT 73*   < > 59*  --  49* 64* 92* 120*   < > = values in this interval not displayed.   Basic Metabolic Panel: Recent Labs  Lab 02/23/20 2028 02/23/20 2028 02/24/20 0532 02/25/20 0606 02/26/20 5573  02/27/20 0554 02/28/20 0509  NA 129*  --  137 135 136 138  --   K 3.9   < > 3.6 3.0* 3.5 4.0 3.7  CL 88*  --  101 99 103 103  --   CO2 26  --  27 28 26 27   --   GLUCOSE 109*  --  77 122* 98 89  --   BUN <5*  --  <5* 7* 8 20  --   CREATININE 0.39*  --  0.38* 0.40* 0.34* 0.47*  --   CALCIUM 8.2*  --  7.6* 8.6* 9.2 9.5  --   MG  --   --  1.8 1.5* 2.1 1.9 1.9  PHOS  --   --   --  2.4* 3.5 3.8 3.7   < > = values in this interval not displayed.   GFR: Estimated Creatinine Clearance: 66.3 mL/min (A) (by C-G formula based on SCr of 0.47 mg/dL (L)). Liver Function Tests: Recent Labs  Lab 02/23/20 2028 02/24/20 0532 02/25/20 0606 02/27/20 0554  AST 64* 53* 66* 55*  ALT 37 33 38 48*  ALKPHOS 99 83 117 108  BILITOT 1.0 1.2 1.6* 1.0  PROT 7.2 6.3* 6.8 7.2  ALBUMIN 4.2 3.6 3.8 3.9   No results for input(s): LIPASE, AMYLASE in the last 168 hours. No results for input(s): AMMONIA in the last 168 hours. Coagulation Profile: Recent Labs  Lab 02/24/20 0801  INR 1.2   Cardiac Enzymes: No results for input(s): CKTOTAL, CKMB, CKMBINDEX, TROPONINI in the last 168 hours. BNP (last 3 results) No results for input(s): PROBNP in the last 8760 hours. HbA1C: No results for input(s): HGBA1C in the last 72 hours. CBG: Recent Labs  Lab 02/26/20 2152 02/27/20 0800 02/27/20 1733 02/27/20 2040 02/28/20 0743  GLUCAP 149* 127* 158* 122* 86   Lipid Profile: No results for input(s): CHOL, HDL, LDLCALC, TRIG, CHOLHDL, LDLDIRECT in the last 72 hours. Thyroid Function Tests: No results for input(s): TSH, T4TOTAL, FREET4, T3FREE, THYROIDAB in the last 72 hours. Anemia Panel: No results for input(s): VITAMINB12, FOLATE, FERRITIN, TIBC, IRON, RETICCTPCT in the last 72 hours. Sepsis Labs: No results for input(s): PROCALCITON, LATICACIDVEN in the last 168 hours.  Recent Results (from the past 240 hour(s))  SARS CORONAVIRUS 2 (TAT 6-24 HRS) Nasopharyngeal Nasopharyngeal Swab     Status: None    Collection Time: 02/23/20 11:49 PM   Specimen: Nasopharyngeal Swab  Result Value Ref Range Status   SARS Coronavirus 2 NEGATIVE NEGATIVE Final    Comment: (NOTE) SARS-CoV-2 target nucleic acids are NOT DETECTED. The SARS-CoV-2 RNA is generally detectable in upper and lower respiratory specimens during the acute phase of infection. Negative results do not preclude SARS-CoV-2 infection, do not rule out co-infections with other pathogens, and should not be used as the sole basis for treatment or other patient management decisions. Negative results must be combined with clinical observations, patient history, and epidemiological information. The expected result is Negative. Fact Sheet for Patients: SugarRoll.be Fact Sheet for Healthcare Providers: https://www.woods-mathews.com/ This test is not yet approved or cleared by the [place] FDA and  has been authorized for detection and/or diagnosis of SARS-CoV-2 by FDA under an Emergency Use Authorization (EUA). This EUA will remain  in effect (meaning this test can be used) for the duration of the COVID-19 declaration under Section 56 4(b)(1) of the Act, 21 U.S.C. section 360bbb-3(b)(1), unless the authorization is terminated or revoked sooner. Performed at [facility], [place] [address], [place], [place] [address]      Radiology Studies: No results found.  Scheduled Meds: . atenolol  25 mg Oral BID  . baclofen  10 mg Oral TID  . feeding supplement (ENSURE ENLIVE)  237 mL Oral TID BM  . folic acid  1 mg Oral Daily  . insulin aspart  0-5 Units Subcutaneous QHS  . insulin aspart  0-9 Units Subcutaneous TID WC  . multivitamin with minerals  1 tablet Oral Daily  . pantoprazole  20 mg Oral Daily  . PARoxetine  30 mg Oral Daily  . senna-docusate  2 tablet Oral BID  . thiamine  100 mg Oral Daily   Continuous Infusions:    LOS: 5 days   Time spent: 35 minutes.  Lorella Nimrod,  MD [facility]  If 7PM-7AM, please contact night-coverage [email or web address]  02/28/2020, 10:54 AM   This record has been created using Systems analyst. Errors have been sought and corrected,but may not always be located. Such creation errors do not reflect on the standard of care.

## 2020-02-28 NOTE — Consult Note (Signed)
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   Follow-up electrolytes in 48 and continue to replace as needed.   SPernell Dupre PharmD, BCPS Clinical Pharmacist 02/28/2020 8:07 AM

## 2020-02-29 DIAGNOSIS — E43 Unspecified severe protein-calorie malnutrition: Secondary | ICD-10-CM

## 2020-02-29 LAB — GLUCOSE, CAPILLARY
Glucose-Capillary: 103 mg/dL — ABNORMAL HIGH (ref 70–99)
Glucose-Capillary: 79 mg/dL (ref 70–99)
Glucose-Capillary: 93 mg/dL (ref 70–99)

## 2020-02-29 LAB — COMPREHENSIVE METABOLIC PANEL
ALT: 54 U/L — ABNORMAL HIGH (ref 0–44)
AST: 48 U/L — ABNORMAL HIGH (ref 15–41)
Albumin: 4.1 g/dL (ref 3.5–5.0)
Alkaline Phosphatase: 97 U/L (ref 38–126)
Anion gap: 9 (ref 5–15)
BUN: 16 mg/dL (ref 8–23)
CO2: 28 mmol/L (ref 22–32)
Calcium: 9.5 mg/dL (ref 8.9–10.3)
Chloride: 101 mmol/L (ref 98–111)
Creatinine, Ser: 0.34 mg/dL — ABNORMAL LOW (ref 0.61–1.24)
GFR calc Af Amer: >60 mL/min (ref >60)
GFR calc non Af Amer: >60 mL/min (ref >60)
Glucose, Bld: 82 mg/dL (ref 70–99)
Potassium: 4.1 mmol/L (ref 3.5–5.1)
Sodium: 138 mmol/L (ref 135–145)
Total Bilirubin: 0.9 mg/dL (ref 0.3–1.2)
Total Protein: 7.9 g/dL (ref 6.5–8.1)

## 2020-02-29 LAB — CBC
HCT: 43.5 % (ref 39.0–52.0)
Hemoglobin: 14.7 g/dL (ref 13.0–17.0)
MCH: 31.5 pg (ref 26.0–34.0)
MCHC: 33.8 g/dL (ref 30.0–36.0)
MCV: 93.3 fL (ref 80.0–100.0)
Platelets: 146 10*3/uL — ABNORMAL LOW (ref 150–400)
RBC: 4.66 MIL/uL (ref 4.22–5.81)
RDW: 13.2 % (ref 11.5–15.5)
WBC: 6.9 10*3/uL (ref 4.0–10.5)
nRBC: 0 % (ref 0.0–0.2)

## 2020-02-29 NOTE — Consult Note (Signed)
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   Follow-up electrolytes in 48 and continue to replace as needed.   Pernell Dupre, PharmD, BCPS Clinical Pharmacist 02/29/2020 11:12 AM

## 2020-02-29 NOTE — Progress Notes (Signed)
Physical Therapy Treatment Patient Details Name: Douglas Peterson. MRN: [id number] DOB: 05-19-1951 Today's Date: 02/29/2020    History of Present Illness 68yoM with multiple recent admits for similar.  He came from home w/ AMS, fall (hit head w/o LOC) and persistent nausea.  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    PT Comments    Pt ready for session.  OOB with min a x 1.  Struggles to get upright with one LOB while attempting needing min a x 1 to steady and get fully upright.  Once seated steady.  Stands with min a x 1 and is able to walk 2 laps in room with QC and short shuffling steps without sneakers.  After seated rest and request to don sneakers, he is able to progress gait in hallway 2 laps.  Gait remains generally unsteady despite increased distance and confidence today.  He continues with forward lean with gait and remains a high fall risk requiring hands on assist at all times for mobility.  He is aware he is not at his baseline for mobility skills.  SNF remains appropriate for discharge planning.   Follow Up Recommendations  SNF     Equipment Recommendations       Recommendations for Other Services       Precautions / Restrictions Precautions Precautions: Fall Restrictions Weight Bearing Restrictions: No    Mobility  Bed Mobility Overal bed mobility: Needs Assistance Bed Mobility: Supine to Sit     Supine to sit: Min assist        Transfers Overall transfer level: Needs assistance Equipment used: Quad cane Transfers: Sit to/from Stand Sit to Stand: Min assist            Ambulation/Gait Ambulation/Gait assistance: Min Web designer (Feet): 300 Feet Assistive device: Quad cane Gait Pattern/deviations: Step-to pattern;Decreased step length - right;Decreased step length - left;Narrow base of support;Shuffle Gait velocity: decreased   General Gait Details: increased confidence today  and tolerance but not at baseline or safe to walk without assist.   Stairs             Wheelchair Mobility    Modified Rankin (Stroke Patients Only)       Balance Overall balance assessment: Needs assistance Sitting-balance support: Single extremity supported Sitting balance-Leahy Scale: Fair     Standing balance support: Single extremity supported Standing balance-Leahy Scale: Poor Standing balance comment: requires MIn a  and 1 UE support throughout standing to maintain static balance                            Cognition Arousal/Alertness: Awake/alert Behavior During Therapy: WFL for tasks assessed/performed Overall Cognitive Status: Within Functional Limits for tasks assessed                                        Exercises      General Comments        Pertinent Vitals/Pain Pain Assessment: No/denies pain    Home Living                      Prior Function            PT Goals (current goals can now be found in the care plan section) Progress towards PT goals: Progressing toward  goals    Frequency    Min 2X/week      PT Plan Current plan remains appropriate    Co-evaluation              AM-PAC PT "6 Clicks" Mobility   Outcome Measure  Help needed turning from your back to your side while in a flat bed without using bedrails?: A Little Help needed moving from lying on your back to sitting on the side of a flat bed without using bedrails?: A Little Help needed moving to and from a bed to a chair (including a wheelchair)?: A Little Help needed standing up from a chair using your arms (e.g., wheelchair or bedside chair)?: A Little Help needed to walk in hospital room?: A Little Help needed climbing 3-5 steps with a railing? : A Lot 6 Click Score: 17    End of Session Equipment Utilized During Treatment: Gait belt Activity Tolerance: Patient tolerated treatment well Patient left: with chair alarm  set;with call bell/phone within reach Nurse Communication: Mobility status       Time: 1125-1150 PT Time Calculation (min) (ACUTE ONLY): 25 min  Charges:  $Gait Training: 23-37 mins                    Chesley Noon, PTA 02/29/20, 12:53 PM

## 2020-02-29 NOTE — TOC Progression Note (Signed)
Transition of Care (TOC) - Progression Note    Patient Details  Name: Douglas Peterson. MRN: [id number] Date of Birth: 1951-05-03  [facility]) CM/SW Contact  Shelbie Hutching, RN Phone Number: 02/29/2020, 4:13 PM  Clinical Narrative:    Insurance authorization approved.  [facility] can accept patient tomorrow.     Expected Discharge Plan: [place] Barriers to Discharge: Continued Medical Work up, Ship broker  Expected Discharge Plan and Services Expected Discharge Plan: [place]       Living arrangements for the past 2 months: Single Family Home                                       Social Determinants of Health (SDOH) Interventions    Readmission Risk Interventions Readmission Risk Prevention Plan 12/13/2019 12/12/2019  Transportation Screening Complete Complete  PCP or Specialist Appt within 3-5 Days (No Data) -  HRI or [place] (No Data) -  Palliative Care Screening Complete Complete  Medication Review (RN Care Manager) Complete Complete  Some recent data might be hidden

## 2020-02-29 NOTE — Progress Notes (Signed)
PROGRESS NOTE    Douglas Peterson.  AUQ:[id number] DOB: 1951/07/18 DOA: 02/23/2020 PCP: [name], MD   Brief Narrative:  Douglas Peterson  is a 69 y.o. Caucasian male with a known history of alcohol abuse, hypertension and traumatic brain injury, who presented to the emergency room with acute onset of generalized weakness and subsequent fall with inability to get up and right shoulder pain.  He admitted to hitting his head when he fell without loss of consciousness.  He has been having nausea and vomiting as well as diarrhea per his report.  Not any abdominal pain.  Labs with mild hyponatremia.  Sodium of 129 and isolated thrombocytopenia of 73.  Imaging were negative for any acute injuries Lovenox.  Patient presently awaiting insurance authorization for disposition to SNF.  Subjective:  Patient states he feels hot in the room.  Feels like he is sweating a lot.  Nuys signs or symptoms of withdrawal.  Notes nausea is improved.  Has been eating reasonably well.  No further diarrhea.  Patient denies shortness of breath at rest although is not sure if he would be short of breath with exertion.  I note that he seems to need to take a breath between sentences and he states that is probably normal for him.  Objective: Vitals:   02/28/20 1611 02/28/20 2110 02/29/20 0039 02/29/20 0757  BP: 133/74 113/80 126/81 (!) 126/93  Pulse: 64 65 61 68  Resp: 14  16 18   Temp: 97.6 F (36.4 C)  98.3 F (36.8 C) (!) 97.5 F (36.4 C)  TempSrc: Oral  Oral Oral  SpO2: 95%  97% 99%  Weight:      Height:        Intake/Output Summary (Last 24 hours) at 02/29/2020 1353 Last data filed at 02/29/2020 0900 Gross per 24 hour  Intake 847 ml  Output --  Net 847 ml   Filed Weights   02/23/20 1923  Weight: 53 kg   Physical Exam: Blood pressure (!) 126/93, pulse 68, temperature (!) 97.5 F (36.4 C), temperature source Oral, resp. rate 18, height 5' 4"  (1.626 m), weight 53 kg, SpO2 99 %. Gen: Somewhat anxious  appearing pale thin man sitting up in bed with right upper extremity hemiparesis in NAD.  He seems to need to take a breath between short sentences. Eyes: sclera anicteric, conjuctiva mildly injected bilaterally CVS: S1-S2, regulary, no gallops Respiratory:  decreased air entry likely secondary to decreased inspiratory effort GI: NABS, soft, NT  LE: No edema. No cyanosis.  Right upper extremity hemiparesis.  Psych: patient is logical and coherent, judgement and insight appear normal, mood and affect are anxious and appropriate to situation.   Assessment & Plan:   Active Problems:   Hyponatremia   Protein-calorie malnutrition, severe  Patient presently awaiting insurance authorization for disposition to SNF.   Hyponatremia.   Resolved with hydration and cessation of alcohol intake.  Electrolyte abnormalities/refeeding syndrome.   Developed multiple electrolyte abnormalities including hypokalemia, hypomagnesemia and hypophosphatemia.  There is a concern of refeeding syndrome due to prior poor p.o. intake and alcohol abuse.  Electrolyte abnormalities mostly resolved now. Replete electrolytes as needed and closely monitor, appreciate ongoing help from pharmacy.  Generalized weakness and frequent falls.   Most likely secondary to alcohol intoxication.   PT is recommending SNF placement. Social worker to find him a bed at [facility] a bed offer waiting for insurance authorization.  Acute on chronic thrombocytopenia.  Stable and improved. Seen by hematology who note  this is most likely secondary to bone marrow suppression secondary to ongoing alcohol intoxication. Patient has an history of hepatic steatosis, no cirrhosis seen on CT scan.   Patient on SCDs although Lovenox can potentially be started since platelets are now 150.  Alcohol abuse./Alcohol intoxication.   On admission alcohol level elevated at 207.   Patient drinks on a daily basis.  Denies any withdrawal which I doubt.  No  evidence of acute withdrawal. Continue vitamin supplementation.  Hypertension.  Blood pressure within goal on home doses of atenolol.  Paroxysmal A. Fib.   Currently in sinus rhythm. On atenolol for rate control as needed. Not a good candidate for anticoagulation due to thrombocytopenia and risk of falls.   DVT prophylaxis: SCD, can consider Lovenox given improvement in platelets. Code Status: Full Family Communication: None today. Disposition Plan:   [name] from: Home  Anticipated Discharge Location: SNF  Barriers to Discharge: Insurance authorization  Is patient medically stable for Discharge: Yes   Consultants:   Hematology  Procedures:  Antimicrobials:   Data Reviewed: I have personally reviewed following labs and imaging studies  CBC: Recent Labs  Lab 02/23/20 2028 02/24/20 0532 02/25/20 0606 02/26/20 4098 02/27/20 0554 02/28/20 0509 02/29/20 0625  WBC 8.8   < > 5.3 5.5 6.7 7.8 6.9  NEUTROABS 2.9  --   --   --   --   --   --   HGB 14.9   < > 13.6 14.6 14.9 14.7 14.7  HCT 42.6   < > 38.0* 41.4 43.7 43.3 43.5  MCV 89.1   < > 88.2 90.0 91.8 93.5 93.3  PLT 73*   < > 49* 64* 92* 120* 146*   < > = values in this interval not displayed.   Basic Metabolic Panel: Recent Labs  Lab 02/24/20 0532 02/24/20 0532 02/25/20 0606 02/26/20 1191 02/27/20 0554 02/28/20 0509 02/29/20 0625  NA 137  --  135 136 138  --  138  K 3.6   < > 3.0* 3.5 4.0 3.7 4.1  CL 101  --  99 103 103  --  101  CO2 27  --  28 26 27   --  28  GLUCOSE 77  --  122* 98 89  --  82  BUN <5*  --  7* 8 20  --  16  CREATININE 0.38*  --  0.40* 0.34* 0.47*  --  0.34*  CALCIUM 7.6*  --  8.6* 9.2 9.5  --  9.5  MG 1.8  --  1.5* 2.1 1.9 1.9  --   PHOS  --   --  2.4* 3.5 3.8 3.7  --    < > = values in this interval not displayed.   GFR: Estimated Creatinine Clearance: 66.3 mL/min (A) (by C-G formula based on SCr of 0.34 mg/dL (L)). Liver Function Tests: Recent Labs  Lab 02/23/20 2028  02/24/20 0532 02/25/20 0606 02/27/20 0554 02/29/20 0625  AST 64* 53* 66* 55* 48*  ALT 37 33 38 48* 54*  ALKPHOS 99 83 117 108 97  BILITOT 1.0 1.2 1.6* 1.0 0.9  PROT 7.2 6.3* 6.8 7.2 7.9  ALBUMIN 4.2 3.6 3.8 3.9 4.1   No results for input(s): LIPASE, AMYLASE in the last 168 hours. No results for input(s): AMMONIA in the last 168 hours. Coagulation Profile: Recent Labs  Lab 02/24/20 0801  INR 1.2   Cardiac Enzymes: No results for input(s): CKTOTAL, CKMB, CKMBINDEX, TROPONINI in the last 168 hours. BNP (  last 3 results) No results for input(s): PROBNP in the last 8760 hours. HbA1C: No results for input(s): HGBA1C in the last 72 hours. CBG: Recent Labs  Lab 02/28/20 0743 02/28/20 1144 02/28/20 2102 02/29/20 0756 02/29/20 1149  GLUCAP 86 95 114* 103* 79   Lipid Profile: No results for input(s): CHOL, HDL, LDLCALC, TRIG, CHOLHDL, LDLDIRECT in the last 72 hours. Thyroid Function Tests: No results for input(s): TSH, T4TOTAL, FREET4, T3FREE, THYROIDAB in the last 72 hours. Anemia Panel: No results for input(s): VITAMINB12, FOLATE, FERRITIN, TIBC, IRON, RETICCTPCT in the last 72 hours. Sepsis Labs: No results for input(s): PROCALCITON, LATICACIDVEN in the last 168 hours.  Recent Results (from the past 240 hour(s))  SARS CORONAVIRUS 2 (TAT 6-24 HRS) Nasopharyngeal Nasopharyngeal Swab     Status: None   Collection Time: 02/23/20 11:49 PM   Specimen: Nasopharyngeal Swab  Result Value Ref Range Status   SARS Coronavirus 2 NEGATIVE NEGATIVE Final    Comment: (NOTE) SARS-CoV-2 target nucleic acids are NOT DETECTED. The SARS-CoV-2 RNA is generally detectable in upper and lower respiratory specimens during the acute phase of infection. Negative results do not preclude SARS-CoV-2 infection, do not rule out co-infections with other pathogens, and should not be used as the sole basis for treatment or other patient management decisions. Negative results must be combined with  clinical observations, patient history, and epidemiological information. The expected result is Negative. Fact Sheet for Patients: SugarRoll.be Fact Sheet for Healthcare Providers: https://www.woods-mathews.com/ This test is not yet approved or cleared by the [place] FDA and  has been authorized for detection and/or diagnosis of SARS-CoV-2 by FDA under an Emergency Use Authorization (EUA). This EUA will remain  in effect (meaning this test can be used) for the duration of the COVID-19 declaration under Section 56 4(b)(1) of the Act, 21 U.S.C. section 360bbb-3(b)(1), unless the authorization is terminated or revoked sooner. Performed at [facility], [place] [address], [place], [place] [address]   SARS CORONAVIRUS 2 (TAT 6-24 HRS) Nasopharyngeal Nasopharyngeal Swab     Status: None   Collection Time: 02/28/20 11:45 AM   Specimen: Nasopharyngeal Swab  Result Value Ref Range Status   SARS Coronavirus 2 NEGATIVE NEGATIVE Final    Comment: (NOTE) SARS-CoV-2 target nucleic acids are NOT DETECTED. The SARS-CoV-2 RNA is generally detectable in upper and lower respiratory specimens during the acute phase of infection. Negative results do not preclude SARS-CoV-2 infection, do not rule out co-infections with other pathogens, and should not be used as the sole basis for treatment or other patient management decisions. Negative results must be combined with clinical observations, patient history, and epidemiological information. The expected result is Negative. Fact Sheet for Patients: SugarRoll.be Fact Sheet for Healthcare Providers: https://www.woods-mathews.com/ This test is not yet approved or cleared by the [place] FDA and  has been authorized for detection and/or diagnosis of SARS-CoV-2 by FDA under an Emergency Use Authorization (EUA). This EUA will remain  in effect (meaning this test can  be used) for the duration of the COVID-19 declaration under Section 56 4(b)(1) of the Act, 21 U.S.C. section 360bbb-3(b)(1), unless the authorization is terminated or revoked sooner. Performed at [facility], [place] [address], [place], [place] [address]      Radiology Studies: No results found.  Scheduled Meds: . atenolol  25 mg Oral BID  . baclofen  10 mg Oral TID  . feeding supplement (ENSURE ENLIVE)  237 mL Oral TID BM  . folic acid  1 mg Oral Daily  . insulin aspart  0-5 Units Subcutaneous QHS  . insulin aspart  0-9 Units Subcutaneous TID WC  . multivitamin with minerals  1 tablet Oral Daily  . pantoprazole  20 mg Oral Daily  . PARoxetine  30 mg Oral Daily  . senna-docusate  2 tablet Oral BID  . thiamine  100 mg Oral Daily   Continuous Infusions:    LOS: 6 days   Time spent: 35 minutes.  Vashti Hey, MD [facility]  If 7PM-7AM, please contact night-coverage [email or web address]  02/29/2020, 1:53 PM   This record has been created using Systems analyst. Errors have been sought and corrected,but may not always be located. Such creation errors do not reflect on the standard of care.

## 2020-03-01 LAB — GLUCOSE, CAPILLARY
Glucose-Capillary: 85 mg/dL (ref 70–99)
Glucose-Capillary: 75 mg/dL (ref 70–99)

## 2020-03-01 NOTE — Care Management Important Message (Signed)
Important Message  Patient Details  Name: Douglas Peterson. MRN: [id number] Date of Birth: 02/12/1951   Medicare Important Message Given:  Yes     Juliann Pulse A  03/01/2020, 10:45 AM

## 2020-03-01 NOTE — TOC Transition Note (Signed)
Transition of Care [facility]) - CM/SW Discharge Note   Patient Details  Name: Douglas Peterson. MRN: [id number] Date of Birth: 1951-03-26  [facility]) CM/SW Contact:  Shelbie Hutching, RN Phone Number: 03/01/2020, 12:11 PM   Clinical Narrative:    [name] medically cleared for discharge to [facility] today.  Patient will be going to room 608.  Bedside RN has called report to the facility and this RNCM has arranged for EMS pick up at 1pm.     Final next level of care: Home/Self Care Barriers to Discharge: Barriers Resolved   Patient Goals and CMS Choice   CMS Medicare.gov Compare Post Acute Care list provided to:: Patient Choice offered to / list presented to : Patient  Discharge Placement   Existing PASRR number confirmed : 02/27/20          Patient chooses bed at: [facility] [place] Patient to be transferred to facility by: [facility] Name of family member notified: patient will notify his sister Patient and family notified of of transfer: 03/01/20  Discharge Plan and Services                                     Social Determinants of Health ([place]) Interventions     Readmission Risk Interventions Readmission Risk Prevention Plan 12/13/2019 12/12/2019  Transportation Screening Complete Complete  PCP or Specialist Appt within 3-5 Days (No Data) -  HRI or [place] (No Data) -  Palliative Care Screening Complete Complete  Medication Review (RN Care Manager) Complete Complete  Some recent data might be hidden

## 2020-03-01 NOTE — Discharge Summary (Signed)
Douglas Peterson. ZOX:[id number] DOB: 1951-09-25 DOA: 02/23/2020  PCP: [name], MD  Admit date: 02/23/2020  Discharge date: 03/01/2020  Admitted From: Home   disposition: SNF   Recommendations for Outpatient Follow-up:   Follow up with PCP in 1-2 weeks  PCP Please obtain BMP  in 1week,    Home Health: N/A Equipment/Devices: N/A Consultations: Hematology Discharge Condition: Improved CODE STATUS: Full Diet Recommendation: Regular  Diet Order            Diet regular Room service appropriate? Yes; Fluid consistency: Thin  Diet effective now               Chief Complaint  Patient presents with  . Alcohol Intoxication     Brief history of present illness from the day of admission and additional interim summary    [name] a68 y.o.Caucasian malewith a known history of alcohol abuse, hypertension and traumatic brain injury, who presented to the emergency room with acute onset ofgeneralizedweakness and subsequent fall with inability to get up and right shoulder pain.He admitted to hitting his head when he fell without loss of consciousness. He has been havingnausea and vomiting as well as diarrhea per his report. Not any abdominal pain.  Labs with mild hyponatremia.  Sodium of 129 and isolated thrombocytopenia of 73.  Imaging were negative for any acute injuries                                                                   Hospital Course   Patient was treated conservatively with IV fluid resuscitation and cessation of alcohol with CIWA protocol monitoring.  Patient responded well with improvement in mental status, improved sodium and fluid balance as well as improvement in platelets.  She was seen by PT/OT who recommended SNF placement and rehab secondary to chronic weakness,  imbalance and propensity for falls.   Hyponatremia.   Resolved with hydration and cessation of alcohol intake.  Electrolyte abnormalities/refeeding syndrome.   Developed multiple electrolyte abnormalities including hypokalemia, hypomagnesemia and hypophosphatemia.  There is a concern of refeeding syndrome due to prior poor p.o. intake and alcohol abuse.  Electrolyte abnormalities mostly resolved now. Replete electrolytes as needed and closely monitor, appreciate ongoing help from pharmacy.  Generalized weakness and frequent falls.   Most likely secondary to alcohol intoxication.   PT is recommending SNF placement. Social worker to find him a bed at [facility] a bed offer waiting for insurance authorization.  Acute on chronic thrombocytopenia.  Stable and improved. Seen by hematology who note this is most likely secondary to bone marrow suppression secondary to ongoing alcohol intoxication. Patient has an history of hepatic steatosis, no cirrhosis seen on CT scan.   Patient on SCDs although Lovenox can potentially be started since platelets are now 150.  Alcohol abuse./Alcohol  intoxication.   On admission alcohol level elevated at 207.   Patient drinks on a daily basis.   No evidence of acute withdrawal. Continue vitamin supplementation.  Hypertension.  Blood pressure within goal on home doses of atenolol.  Paroxysmal A. Fib.   Currently in sinus rhythm. On atenolol for rate control as needed. Not a good candidate for anticoagulation due to thrombocytopenia and risk of falls.      Discharge diagnosis     Active Problems:   Hyponatremia   Protein-calorie malnutrition, severe    Discharge instructions    Discharge Instructions    Increase activity slowly   Complete by: As directed       Discharge Medications   Allergies as of 03/01/2020   No Known Allergies     Medication List    STOP taking these medications   acetaminophen 325 MG tablet Commonly  known as: TYLENOL     TAKE these medications   atenolol 25 MG tablet Commonly known as: TENORMIN Take 25 mg by mouth 2 (two) times daily.   baclofen 10 MG tablet Commonly known as: LIORESAL Take 10 mg by mouth 3 (three) times daily.   doxepin 10 MG capsule Commonly known as: SINEQUAN Take 1 capsule by mouth at bedtime as needed for sleep.   feeding supplement (ENSURE ENLIVE) Liqd Take 237 mLs by mouth 2 (two) times daily between meals.   folic acid 1 MG tablet Commonly known as: FOLVITE Take 1 tablet (1 mg total) by mouth daily.   hydrOXYzine 25 MG tablet Commonly known as: ATARAX/VISTARIL Take 25 mg by mouth 3 (three) times daily as needed.   multivitamin with minerals Tabs tablet Take 1 tablet by mouth daily.   omeprazole 20 MG tablet Commonly known as: PriLOSEC OTC Take 1 tablet (20 mg total) by mouth daily.   ondansetron 4 MG disintegrating tablet Commonly known as: Zofran ODT Allow 1-2 tablets to dissolve in your mouth every 8 hours as needed for nausea/vomiting   PARoxetine 30 MG tablet Commonly known as: PAXIL Take 30 mg by mouth daily.   senna-docusate 8.6-50 MG tablet Commonly known as: Senokot-S Take 2 tablets by mouth 2 (two) times daily.   sucralfate 1 g tablet Commonly known as: Carafate Take 1 tablet (1 g total) by mouth 4 (four) times daily as needed (for abdominal discomfort, nausea, and/or vomiting).   thiamine 100 MG tablet Take 1 tablet (100 mg total) by mouth daily.       Contact information for after-discharge care    Destination    [facility] Preferred SNF .   Service: Skilled Nursing Contact information: [address] [place] [phone]              Major procedures and Radiology Reports - PLEASE review detailed and final reports thoroughly  -      DG Shoulder Right  Result Date: 02/23/2020 CLINICAL DATA:  Right shoulder pain, fall EXAM: RIGHT SHOULDER - 2+ VIEW COMPARISON:   06/14/2019 FINDINGS: There is deformity of the proximal right humerus related to old healed fracture. Degenerative changes in the right AC and glenohumeral joints with joint space narrowing. No acute fracture, subluxation or dislocation. IMPRESSION: Old healed proximal right humeral fracture with deformity. No acute fracture. Electronically Signed   By: Rolm Baptise M.D.   On: 02/23/2020 20:00   CT HEAD WO CONTRAST  Result Date: 02/23/2020 CLINICAL DATA:  Patient drank 8 beers today and fell this morning. EXAM:  CT HEAD WITHOUT CONTRAST TECHNIQUE: Contiguous axial images were obtained from the base of the skull through the vertex without intravenous contrast. COMPARISON:  01/17/2020 FINDINGS: Brain: No evidence of acute infarction, hemorrhage, hydrocephalus, extra-axial collection or mass lesion/mass effect. There is ventricular sulcal enlargement reflecting atrophy, advanced for age. Minor periventricular white matter hypoattenuation is also noted consistent with chronic microvascular ischemic change. Vascular: No hyperdense vessel or unexpected calcification. Skull: Normal. Negative for fracture or focal lesion. Sinuses/Orbits: Globes and orbits are unremarkable. Visualized sinuses are clear. Other: None. IMPRESSION: 1. No acute intracranial abnormalities. 2. Atrophy advanced for age. Minor chronic microvascular ischemic change. Electronically Signed   By: Lajean Manes M.D.   On: 02/23/2020 19:56   CT CERVICAL SPINE WO CONTRAST  Result Date: 02/23/2020 CLINICAL DATA:  Fall while intoxicated EXAM: CT CERVICAL SPINE WITHOUT CONTRAST TECHNIQUE: Multidetector CT imaging of the cervical spine was performed without intravenous contrast. Multiplanar CT image reconstructions were also generated. COMPARISON:  CT 10/30/2019 FINDINGS: Alignment: Straightening of normal cervical lordosis with slight focal reversal at the C5-6 level. There is slight leftward rotation of the cranium with craniocervical atlantoaxial  articulations limits for this degree of rotation. No abnormally widened, perched or jumped facets. Skull base and vertebrae: Fusion of the C5-6 vertebral bodies with chronic anterior wedging deformity of the C6 levels. Bony fusion across the left C4-5 articular facets right C5-6 articular facets. No acute fracture. No primary bone lesion or focal pathologic process. Soft tissues and spinal canal: No pre or paravertebral fluid or swelling. No visible canal hematoma. Disc levels: Multilevel intervertebral disc height loss with spondylitic endplate changes. Disc osteophyte complex at C5-6 and C6-C7 result in at most mild canal stenosis. Multilevel uncinate spurring and facet hypertrophic changes result in multilevel neural foraminal narrowing as well. Upper chest: No acute abnormality in the upper chest or imaged lung apices. Other: Normal thyroid. Cervical carotid atherosclerosis. For findings within the head, please see dedicated CT of the head performed concurrently. IMPRESSION: 1. No acute fracture or malalignment of the cervical spine. 2. Fusion of the C5-6 vertebral bodies with chronic anterior wedging deformity of the C6 levels. 3. Multilevel degenerative disc disease and facet hypertrophic changes, detailed above. 4. Cervical carotid atherosclerosis. Electronically Signed   By: Lovena Le M.D.   On: 02/23/2020 20:06    Micro Results    Recent Results (from the past 240 hour(s))  SARS CORONAVIRUS 2 (TAT 6-24 HRS) Nasopharyngeal Nasopharyngeal Swab     Status: None   Collection Time: 02/23/20 11:49 PM   Specimen: Nasopharyngeal Swab  Result Value Ref Range Status   SARS Coronavirus 2 NEGATIVE NEGATIVE Final    Comment: (NOTE) SARS-CoV-2 target nucleic acids are NOT DETECTED. The SARS-CoV-2 RNA is generally detectable in upper and lower respiratory specimens during the acute phase of infection. Negative results do not preclude SARS-CoV-2 infection, do not rule out co-infections with other  pathogens, and should not be used as the sole basis for treatment or other patient management decisions. Negative results must be combined with clinical observations, patient history, and epidemiological information. The expected result is Negative. Fact Sheet for Patients: SugarRoll.be Fact Sheet for Healthcare Providers: https://www.woods-mathews.com/ This test is not yet approved or cleared by the [place] FDA and  has been authorized for detection and/or diagnosis of SARS-CoV-2 by FDA under an Emergency Use Authorization (EUA). This EUA will remain  in effect (meaning this test can be used) for the duration of the COVID-19 declaration under Section 56 4(b)(1)  of the Act, 21 U.S.C. section 360bbb-3(b)(1), unless the authorization is terminated or revoked sooner. Performed at [facility], [place] [address], [place], [place] [address]   SARS CORONAVIRUS 2 (TAT 6-24 HRS) Nasopharyngeal Nasopharyngeal Swab     Status: None   Collection Time: 02/28/20 11:45 AM   Specimen: Nasopharyngeal Swab  Result Value Ref Range Status   SARS Coronavirus 2 NEGATIVE NEGATIVE Final    Comment: (NOTE) SARS-CoV-2 target nucleic acids are NOT DETECTED. The SARS-CoV-2 RNA is generally detectable in upper and lower respiratory specimens during the acute phase of infection. Negative results do not preclude SARS-CoV-2 infection, do not rule out co-infections with other pathogens, and should not be used as the sole basis for treatment or other patient management decisions. Negative results must be combined with clinical observations, patient history, and epidemiological information. The expected result is Negative. Fact Sheet for Patients: SugarRoll.be Fact Sheet for Healthcare Providers: https://www.woods-mathews.com/ This test is not yet approved or cleared by the [place] FDA and  has been authorized for  detection and/or diagnosis of SARS-CoV-2 by FDA under an Emergency Use Authorization (EUA). This EUA will remain  in effect (meaning this test can be used) for the duration of the COVID-19 declaration under Section 56 4(b)(1) of the Act, 21 U.S.C. section 360bbb-3(b)(1), unless the authorization is terminated or revoked sooner. Performed at [facility], [place] [address], [place], [place] [address]     Today   Subjective    Marquice Uddin feels much improved since admission.  Is happy to be going to [facility] for rehab.  Denies chest pain, shortness of breath or abdominal pain.   Objective   Blood pressure 116/76, pulse 65, temperature 98.4 F (36.9 C), temperature source Oral, resp. rate 18, height 5' 4"  (1.626 m), weight 53 kg, SpO2 96 %.   Intake/Output Summary (Last 24 hours) at 03/01/2020 1201 Last data filed at 03/01/2020 1013 Gross per 24 hour  Intake 840 ml  Output 450 ml  Net 390 ml    Exam General: Patient in good spirits sitting up in bed in no acute distress.  Eyes: sclera anicteric, conjuctiva mild injection bilaterally CVS: S1-S2, regular  Respiratory:  decreased air entry bilaterally secondary to decreased inspiratory effort, rales at bases  GI: NABS, soft, NT  LE: No edema.  Neuro: Right upper extremity hemiparesis with contracture as previous, A/O x 3,grossly nonfocal.  Psych: patient is logical and coherent, judgement and insight appear normal, mood and affect appropriate to situation.    Data Review   CBC w Diff:  Lab Results  Component Value Date   WBC 6.9 02/29/2020   HGB 14.7 02/29/2020   HCT 43.5 02/29/2020   HCT 41.3 02/24/2020   PLT 146 (L) 02/29/2020   LYMPHOPCT 61 02/23/2020   MONOPCT 5 02/23/2020   EOSPCT 1 02/23/2020   BASOPCT 1 02/23/2020    CMP:  Lab Results  Component Value Date   NA 138 02/29/2020   K 4.1 02/29/2020   CL 101 02/29/2020   CO2 28 02/29/2020   BUN 16 02/29/2020   CREATININE 0.34 (L) 02/29/2020    PROT 7.9 02/29/2020   ALBUMIN 4.1 02/29/2020   BILITOT 0.9 02/29/2020   ALKPHOS 97 02/29/2020   AST 48 (H) 02/29/2020   ALT 54 (H) 02/29/2020  .   Total Time in preparing paper work, data evaluation and todays exam - 35 minutes  Vashti Hey M.D on 03/01/2020 at 12:01 PM  [facility]   Office  [phone]

## 2020-03-01 NOTE — Progress Notes (Signed)
Physical Therapy Treatment Patient Details Name: Douglas Peterson. MRN: [id number] DOB: 04-Apr-1951 Today's Date: 03/01/2020    History of Present Illness 68yoM with multiple recent admits for similar.  He came from home w/ AMS, fall (hit head w/o LOC) and persistent nausea.  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    PT Comments    Ambulated x 3 laps with SPC.  Gait remains unsteady with forward lean which increases risk for falls.  Requires hands on assist at all times to prevent falls.  SNF remains appropriate as he is not back at baseline and safe for discharge home.   Follow Up Recommendations  SNF     Equipment Recommendations  None recommended by PT    Recommendations for Other Services       Precautions / Restrictions Precautions Precautions: Fall Restrictions Weight Bearing Restrictions: No    Mobility  Bed Mobility Overal bed mobility: Needs Assistance Bed Mobility: Supine to Sit     Supine to sit: Min assist        Transfers Overall transfer level: Needs assistance Equipment used: Straight cane   Sit to Stand: Min assist            Ambulation/Gait Ambulation/Gait assistance: Min assist Gait Distance (Feet): 450 Feet Assistive device: Straight cane Gait Pattern/deviations: Step-to pattern;Decreased step length - right;Decreased step length - left;Narrow base of support;Shuffle Gait velocity: decreased   General Gait Details: increased confidence today and tolerance but not at baseline or safe to walk without assist.   Stairs             Wheelchair Mobility    Modified Rankin (Stroke Patients Only)       Balance Overall balance assessment: Needs assistance Sitting-balance support: Single extremity supported Sitting balance-Leahy Scale: Fair     Standing balance support: Single extremity supported Standing balance-Leahy Scale: Poor Standing balance comment: requires  MIn a  and 1 UE support throughout standing to maintain static balance                            Cognition Arousal/Alertness: Awake/alert Behavior During Therapy: WFL for tasks assessed/performed Overall Cognitive Status: Within Functional Limits for tasks assessed                                        Exercises      General Comments        Pertinent Vitals/Pain Pain Assessment: No/denies pain    Home Living                      Prior Function            PT Goals (current goals can now be found in the care plan section) Progress towards PT goals: Progressing toward goals    Frequency    Min 2X/week      PT Plan Current plan remains appropriate    Co-evaluation              AM-PAC PT "6 Clicks" Mobility   Outcome Measure  Help needed turning from your back to your side while in a flat bed without using bedrails?: A Little Help needed moving from lying on your back to sitting on the side of a flat bed without using bedrails?: A  Little Help needed moving to and from a bed to a chair (including a wheelchair)?: A Little Help needed standing up from a chair using your arms (e.g., wheelchair or bedside chair)?: A Little Help needed to walk in hospital room?: A Little Help needed climbing 3-5 steps with a railing? : A Lot 6 Click Score: 17    End of Session Equipment Utilized During Treatment: Gait belt Activity Tolerance: Patient tolerated treatment well Patient left: with chair alarm set;with call bell/phone within reach Nurse Communication: Mobility status       Time: 5003-7048 PT Time Calculation (min) (ACUTE ONLY): 15 min  Charges:  $Gait Training: 8-22 mins                    Chesley Noon, PTA 03/01/20, 12:19 PM

## 2020-03-01 NOTE — Progress Notes (Signed)
Occupational Therapy Treatment Patient Details Name: Douglas Peterson. MRN: [id number] DOB: 1951-10-16 Today's Date: 03/01/2020    History of present illness 68yoM with multiple recent admits for similar.  He came from home w/ AMS, fall (hit head w/o LOC) and persistent nausea.  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   OT comments  Douglas Peterson seen for OT treatment on this date. Upon arrival to room pt room, pt awake/alert, just finishing up physical therapy session. Pt agreeable to continuing with OT session this date. Pt requests to brush his teeth. Performs standing grooming tasks including oral care, hair combing, and face washing as described below. See ADL section for additional information regarding occupational performance. Pt requires consistent MIN A for standing balance support but is otherwise able to complete tasks w/o physical assist from therapist. Pt educated on falls prevention strategies and safe transfer techniques. He return verbalizes understanding of education provided. Pt  making good progress toward goals and continues to benefit from skilled OT services to maximize return to PLOF and minimize risk of future falls, injury, caregiver burden, and readmission. Will continue to follow POC. Discharge recommendation remains appropriate.    Follow Up Recommendations  SNF;Supervision - Intermittent    Equipment Recommendations  Other (comment)    Recommendations for Other Services      Precautions / Restrictions Precautions Precautions: Fall Restrictions Weight Bearing Restrictions: No       Mobility Bed Mobility Overal bed mobility: Needs Assistance Bed Mobility: Supine to Sit     Supine to sit: Min assist     General bed mobility comments: Deferred. Pt up in recliner at start/end of session.  Transfers Overall transfer level: Needs assistance Equipment used: Straight cane Transfers: Sit to/from  Stand Sit to Stand: Min assist              Balance Overall balance assessment: Needs assistance Sitting-balance support: Feet unsupported;No upper extremity supported Sitting balance-Leahy Scale: Good Sitting balance - Comments: Steady static sitting, reaching within BOS   Standing balance support: During functional activity;Single extremity supported Standing balance-Leahy Scale: Poor Standing balance comment: requires MIn a  and 1 UE support throughout standing to maintain static balance                           ADL either performed or assessed with clinical judgement   ADL Overall ADL's : Needs assistance/impaired                                       General ADL Comments: Pt continues to require MIN A for standing ADL management. He completes standing grooming tasks at his room sink with intermittent MIN A for standing balance. He performs functional mobility this date given MIN A for stability and use of a SPC.     Vision Patient Visual Report: No change from baseline     Perception     Praxis      Cognition Arousal/Alertness: Awake/alert Behavior During Therapy: WFL for tasks assessed/performed Overall Cognitive Status: Within Functional Limits for tasks assessed                                          Exercises Other Exercises  Other Exercises: OT facilitates standing grooming tasks, provides eduation on safe use of AE for functional mobility, and safe transfer technique. Pt return verbalizes understanding of education provided.   Shoulder Instructions       General Comments      Pertinent Vitals/ Pain       Pain Assessment: No/denies pain  Home Living                                          Prior Functioning/Environment              Frequency  Min 2X/week        Progress Toward Goals  OT Goals(current goals can now be found in the care plan section)  Progress towards OT  goals: Progressing toward goals  Acute Rehab OT Goals Patient Stated Goal: Get back to walking well and living on his own OT Goal Formulation: With patient Time For Goal Achievement: 03/10/20 Potential to Achieve Goals: Good  Plan Discharge plan remains appropriate;Frequency remains appropriate    Co-evaluation                 AM-PAC OT "6 Clicks" Daily Activity     Outcome Measure   Help from another person eating meals?: A Little Help from another person taking care of personal grooming?: A Little Help from another person toileting, which includes using toliet, bedpan, or urinal?: A Little Help from another person bathing (including washing, rinsing, drying)?: A Little Help from another person to put on and taking off regular upper body clothing?: A Little Help from another person to put on and taking off regular lower body clothing?: A Lot 6 Click Score: 17    End of Session Equipment Utilized During Treatment: Gait belt(Straight cane)  OT Visit Diagnosis: Unsteadiness on feet (R26.81);Muscle weakness (generalized) (M62.81);History of falling (Z91.81)   Activity Tolerance Patient tolerated treatment well   Patient Left with call bell/phone within reach;with nursing/sitter in room;in chair;with chair alarm set   Nurse Communication          Time: 6004-5997 OT Time Calculation (min): 18 min  Charges: OT General Charges $OT Visit: 1 Visit OT Treatments $Self Care/Home Management : 8-22 mins  Shara Blazing, M.S., OTR/L Ascom: [phone] 03/01/20, 1:27 PM

## 2020-03-17 ENCOUNTER — Encounter: Payer: Self-pay | Admitting: Radiology

## 2020-03-17 ENCOUNTER — Other Ambulatory Visit: Payer: Self-pay

## 2020-03-17 ENCOUNTER — Emergency Department: Payer: Medicare HMO

## 2020-03-17 ENCOUNTER — Inpatient Hospital Stay
Admission: EM | Admit: 2020-03-17 | Discharge: 2020-03-26 | DRG: 871 | Disposition: A | Discharge status: Home Health | Payer: Medicare HMO | Attending: Internal Medicine | Admitting: Internal Medicine

## 2020-03-17 DIAGNOSIS — F10920 Alcohol use, unspecified with intoxication, uncomplicated: Secondary | ICD-10-CM

## 2020-03-17 DIAGNOSIS — L89151 Pressure ulcer of sacral region, stage 1: Secondary | ICD-10-CM

## 2020-03-17 DIAGNOSIS — K5641 Fecal impaction: Secondary | ICD-10-CM | POA: Diagnosis present

## 2020-03-17 DIAGNOSIS — F10220 Alcohol dependence with intoxication, uncomplicated: Secondary | ICD-10-CM | POA: Diagnosis present

## 2020-03-17 DIAGNOSIS — R112 Nausea with vomiting, unspecified: Secondary | ICD-10-CM

## 2020-03-17 DIAGNOSIS — I480 Paroxysmal atrial fibrillation: Secondary | ICD-10-CM | POA: Diagnosis not present

## 2020-03-17 DIAGNOSIS — R5381 Other malaise: Secondary | ICD-10-CM | POA: Diagnosis present

## 2020-03-17 DIAGNOSIS — Z9181 History of falling: Secondary | ICD-10-CM

## 2020-03-17 DIAGNOSIS — G8111 Spastic hemiplegia affecting right dominant side: Secondary | ICD-10-CM | POA: Diagnosis present

## 2020-03-17 DIAGNOSIS — K5900 Constipation, unspecified: Secondary | ICD-10-CM | POA: Diagnosis not present

## 2020-03-17 DIAGNOSIS — F1010 Alcohol abuse, uncomplicated: Secondary | ICD-10-CM | POA: Diagnosis not present

## 2020-03-17 DIAGNOSIS — Z6821 Body mass index (BMI) 21.0-21.9, adult: Secondary | ICD-10-CM

## 2020-03-17 DIAGNOSIS — L89501 Pressure ulcer of unspecified ankle, stage 1: Secondary | ICD-10-CM | POA: Diagnosis present

## 2020-03-17 DIAGNOSIS — Y908 Blood alcohol level of 240 mg/100 ml or more: Secondary | ICD-10-CM | POA: Diagnosis present

## 2020-03-17 DIAGNOSIS — Z79899 Other long term (current) drug therapy: Secondary | ICD-10-CM

## 2020-03-17 DIAGNOSIS — K219 Gastro-esophageal reflux disease without esophagitis: Secondary | ICD-10-CM | POA: Diagnosis present

## 2020-03-17 DIAGNOSIS — K6289 Other specified diseases of anus and rectum: Secondary | ICD-10-CM | POA: Diagnosis present

## 2020-03-17 DIAGNOSIS — E860 Dehydration: Secondary | ICD-10-CM | POA: Diagnosis present

## 2020-03-17 DIAGNOSIS — X58XXXS Exposure to other specified factors, sequela: Secondary | ICD-10-CM | POA: Diagnosis present

## 2020-03-17 DIAGNOSIS — S06890S Other specified intracranial injury without loss of consciousness, sequela: Secondary | ICD-10-CM

## 2020-03-17 DIAGNOSIS — Z87891 Personal history of nicotine dependence: Secondary | ICD-10-CM

## 2020-03-17 DIAGNOSIS — I10 Essential (primary) hypertension: Secondary | ICD-10-CM | POA: Diagnosis present

## 2020-03-17 DIAGNOSIS — Z8249 Family history of ischemic heart disease and other diseases of the circulatory system: Secondary | ICD-10-CM

## 2020-03-17 DIAGNOSIS — E871 Hypo-osmolality and hyponatremia: Secondary | ICD-10-CM | POA: Diagnosis present

## 2020-03-17 DIAGNOSIS — Z801 Family history of malignant neoplasm of trachea, bronchus and lung: Secondary | ICD-10-CM

## 2020-03-17 DIAGNOSIS — E43 Unspecified severe protein-calorie malnutrition: Secondary | ICD-10-CM | POA: Diagnosis present

## 2020-03-17 DIAGNOSIS — A419 Sepsis, unspecified organism: Principal | ICD-10-CM | POA: Diagnosis present

## 2020-03-17 DIAGNOSIS — F10929 Alcohol use, unspecified with intoxication, unspecified: Secondary | ICD-10-CM | POA: Diagnosis present

## 2020-03-17 DIAGNOSIS — R001 Bradycardia, unspecified: Secondary | ICD-10-CM | POA: Diagnosis present

## 2020-03-17 DIAGNOSIS — Z20822 Contact with and (suspected) exposure to covid-19: Secondary | ICD-10-CM | POA: Diagnosis present

## 2020-03-17 HISTORY — DX: Unspecified atrial fibrillation: I48.91

## 2020-03-17 LAB — CBC WITH DIFFERENTIAL/PLATELET
Abs Immature Granulocytes: 0.06 10*3/uL (ref 0.00–0.07)
Basophils Absolute: 0.1 10*3/uL (ref 0.0–0.1)
Basophils Relative: 1 %
Eosinophils Absolute: 0 10*3/uL (ref 0.0–0.5)
Eosinophils Relative: 0 %
HCT: 49.4 % (ref 39.0–52.0)
Hemoglobin: 16.9 g/dL (ref 13.0–17.0)
Immature Granulocytes: 0 %
Lymphocytes Relative: 50 %
Lymphs Abs: 9.6 10*3/uL — ABNORMAL HIGH (ref 0.7–4.0)
MCH: 31 pg (ref 26.0–34.0)
MCHC: 34.2 g/dL (ref 30.0–36.0)
MCV: 90.6 fL (ref 80.0–100.0)
Monocytes Absolute: 0.8 10*3/uL (ref 0.1–1.0)
Monocytes Relative: 4 %
Neutro Abs: 8.8 10*3/uL — ABNORMAL HIGH (ref 1.7–7.7)
Neutrophils Relative %: 45 %
Platelets: 375 10*3/uL (ref 150–400)
RBC: 5.45 MIL/uL (ref 4.22–5.81)
RDW: 12.6 % (ref 11.5–15.5)
Smear Review: NORMAL
WBC: 19.5 10*3/uL — ABNORMAL HIGH (ref 4.0–10.5)
nRBC: 0.1 % (ref 0.0–0.2)

## 2020-03-17 LAB — COMPREHENSIVE METABOLIC PANEL
ALT: 25 U/L (ref 0–44)
AST: 36 U/L (ref 15–41)
Albumin: 4.6 g/dL (ref 3.5–5.0)
Alkaline Phosphatase: 96 U/L (ref 38–126)
Anion gap: 16 — ABNORMAL HIGH (ref 5–15)
BUN: 6 mg/dL — ABNORMAL LOW (ref 8–23)
CO2: 26 mmol/L (ref 22–32)
Calcium: 8.9 mg/dL (ref 8.9–10.3)
Chloride: 86 mmol/L — ABNORMAL LOW (ref 98–111)
Creatinine, Ser: 0.53 mg/dL — ABNORMAL LOW (ref 0.61–1.24)
GFR calc Af Amer: >60 mL/min (ref >60)
GFR calc non Af Amer: >60 mL/min (ref >60)
Glucose, Bld: 113 mg/dL — ABNORMAL HIGH (ref 70–99)
Potassium: 4.1 mmol/L (ref 3.5–5.1)
Sodium: 128 mmol/L — ABNORMAL LOW (ref 135–145)
Total Bilirubin: 1.1 mg/dL (ref 0.3–1.2)
Total Protein: 8.4 g/dL — ABNORMAL HIGH (ref 6.5–8.1)

## 2020-03-17 LAB — URINALYSIS, COMPLETE (UACMP) WITH MICROSCOPIC
Bacteria, UA: NONE SEEN
Bilirubin Urine: NEGATIVE
Glucose, UA: NEGATIVE mg/dL
Hgb urine dipstick: NEGATIVE
Ketones, ur: 5 mg/dL — AB
Leukocytes,Ua: NEGATIVE
Nitrite: NEGATIVE
Protein, ur: 100 mg/dL — AB
Specific Gravity, Urine: 1.029 (ref 1.005–1.030)
Squamous Epithelial / HPF: NONE SEEN (ref 0–5)
pH: 5 (ref 5.0–8.0)

## 2020-03-17 LAB — TROPONIN I (HIGH SENSITIVITY)
Troponin I (High Sensitivity): 8 ng/L (ref <18)
Troponin I (High Sensitivity): 7 ng/L (ref <18)

## 2020-03-17 LAB — URINE DRUG SCREEN, QUALITATIVE (ARMC ONLY)
Amphetamines, Ur Screen: NOT DETECTED
Barbiturates, Ur Screen: NOT DETECTED
Benzodiazepine, Ur Scrn: NOT DETECTED
Cannabinoid 50 Ng, Ur ~~LOC~~: NOT DETECTED
Cocaine Metabolite,Ur ~~LOC~~: NOT DETECTED
MDMA (Ecstasy)Ur Screen: NOT DETECTED
Methadone Scn, Ur: NOT DETECTED
Opiate, Ur Screen: NOT DETECTED
Phencyclidine (PCP) Ur S: NOT DETECTED
Tricyclic, Ur Screen: NOT DETECTED

## 2020-03-17 LAB — LIPASE, BLOOD: Lipase: 22 U/L (ref 11–51)

## 2020-03-17 LAB — PHOSPHORUS: Phosphorus: 3.2 mg/dL (ref 2.5–4.6)

## 2020-03-17 LAB — LACTIC ACID, PLASMA
Lactic Acid, Venous: 4.7 mmol/L (ref 0.5–1.9)
Lactic Acid, Venous: 5 mmol/L (ref 0.5–1.9)

## 2020-03-17 LAB — AMMONIA: Ammonia: 14 umol/L (ref 9–35)

## 2020-03-17 LAB — MAGNESIUM: Magnesium: 2 mg/dL (ref 1.7–2.4)

## 2020-03-17 LAB — SARS CORONAVIRUS 2 BY RT PCR (HOSPITAL ORDER, PERFORMED IN ~~LOC~~ HOSPITAL LAB): SARS Coronavirus 2: NEGATIVE

## 2020-03-17 LAB — CK: Total CK: 88 U/L (ref 49–397)

## 2020-03-17 LAB — ETHANOL: Alcohol, Ethyl (B): 266 mg/dL — ABNORMAL HIGH (ref <10)

## 2020-03-17 MED ORDER — BACLOFEN 10 MG PO TABS
10.0000 mg | ORAL_TABLET | Freq: Three times a day (TID) | ORAL | Status: DC
  Administered 2020-03-18 – 2020-03-26 (×23): 10 mg via ORAL
  Filled 2020-03-17 (×30): qty 1

## 2020-03-17 MED ORDER — BISACODYL 10 MG RE SUPP
10.0000 mg | Freq: Once | RECTAL | Status: AC
  Administered 2020-03-17: 10 mg via RECTAL
  Filled 2020-03-17: qty 1

## 2020-03-17 MED ORDER — PEG 3350-KCL-NA BICARB-NACL 420 G PO SOLR
4000.0000 mL | Freq: Once | ORAL | Status: AC
  Administered 2020-03-17: 4000 mL via ORAL
  Filled 2020-03-17: qty 4000

## 2020-03-17 MED ORDER — ACETAMINOPHEN 325 MG PO TABS
650.0000 mg | ORAL_TABLET | Freq: Once | ORAL | Status: AC
  Administered 2020-03-17: 650 mg via ORAL
  Filled 2020-03-17: qty 2

## 2020-03-17 MED ORDER — SODIUM CHLORIDE 0.9 % IV BOLUS
1000.0000 mL | Freq: Once | INTRAVENOUS | Status: AC
  Administered 2020-03-17: 1000 mL via INTRAVENOUS

## 2020-03-17 MED ORDER — MORPHINE SULFATE (PF) 2 MG/ML IV SOLN: 2.0000 mg | Freq: Once | INTRAVENOUS | Status: DC | PRN

## 2020-03-17 MED ORDER — ENOXAPARIN SODIUM 40 MG/0.4ML ~~LOC~~ SOLN
40.0000 mg | SUBCUTANEOUS | Status: DC
  Administered 2020-03-17 – 2020-03-25 (×9): 40 mg via SUBCUTANEOUS
  Filled 2020-03-17 (×9): qty 0.4

## 2020-03-17 MED ORDER — LABETALOL HCL 5 MG/ML IV SOLN
10.0000 mg | Freq: Four times a day (QID) | INTRAVENOUS | Status: DC | PRN
  Administered 2020-03-18 – 2020-03-19 (×2): 10 mg via INTRAVENOUS
  Filled 2020-03-17 (×2): qty 4

## 2020-03-17 MED ORDER — THIAMINE HCL 100 MG/ML IJ SOLN: 100.0000 mg | Freq: Every day | INTRAMUSCULAR | Status: DC

## 2020-03-17 MED ORDER — PIPERACILLIN-TAZOBACTAM 3.375 G IVPB 30 MIN
3.3750 g | Freq: Once | INTRAVENOUS | Status: AC
  Administered 2020-03-17: 3.375 g via INTRAVENOUS
  Filled 2020-03-17: qty 50

## 2020-03-17 MED ORDER — ACETAMINOPHEN 650 MG RE SUPP: 650.0000 mg | Freq: Four times a day (QID) | RECTAL | Status: DC | PRN

## 2020-03-17 MED ORDER — THIAMINE HCL 100 MG PO TABS
100.0000 mg | ORAL_TABLET | Freq: Every day | ORAL | Status: DC
  Administered 2020-03-18 – 2020-03-26 (×9): 100 mg via ORAL
  Filled 2020-03-17 (×9): qty 1

## 2020-03-17 MED ORDER — ACETAMINOPHEN 325 MG PO TABS
650.0000 mg | ORAL_TABLET | Freq: Four times a day (QID) | ORAL | Status: DC | PRN
  Administered 2020-03-18 – 2020-03-19 (×2): 650 mg via ORAL
  Filled 2020-03-17 (×2): qty 2

## 2020-03-17 MED ORDER — ONDANSETRON HCL 4 MG PO TABS
4.0000 mg | ORAL_TABLET | Freq: Four times a day (QID) | ORAL | Status: DC | PRN
  Administered 2020-03-18 – 2020-03-23 (×2): 4 mg via ORAL
  Filled 2020-03-17 (×2): qty 1

## 2020-03-17 MED ORDER — FOLIC ACID 1 MG PO TABS
1.0000 mg | ORAL_TABLET | Freq: Every day | ORAL | Status: DC
  Administered 2020-03-18 – 2020-03-26 (×9): 1 mg via ORAL
  Filled 2020-03-17 (×9): qty 1

## 2020-03-17 MED ORDER — METRONIDAZOLE IN NACL 5-0.79 MG/ML-% IV SOLN
500.0000 mg | Freq: Three times a day (TID) | INTRAVENOUS | Status: DC
  Administered 2020-03-17 – 2020-03-22 (×14): 500 mg via INTRAVENOUS
  Filled 2020-03-17 (×19): qty 100

## 2020-03-17 MED ORDER — OMEPRAZOLE MAGNESIUM 20 MG PO TBEC: 20.0000 mg | DELAYED_RELEASE_TABLET | Freq: Every day | ORAL | Status: DC

## 2020-03-17 MED ORDER — LORAZEPAM 1 MG PO TABS
1.0000 mg | ORAL_TABLET | ORAL | Status: DC | PRN
  Administered 2020-03-18: 1 mg via ORAL
  Administered 2020-03-18: 2 mg via ORAL
  Administered 2020-03-18 – 2020-03-19 (×3): 1 mg via ORAL
  Administered 2020-03-19: 2 mg via ORAL
  Administered 2020-03-20 (×3): 1 mg via ORAL
  Administered 2020-03-20: 2 mg via ORAL
  Filled 2020-03-17 (×2): qty 1
  Filled 2020-03-17: qty 2
  Filled 2020-03-17 (×2): qty 1
  Filled 2020-03-17 (×2): qty 2
  Filled 2020-03-17 (×3): qty 1

## 2020-03-17 MED ORDER — IOHEXOL 300 MG/ML  SOLN
75.0000 mL | Freq: Once | INTRAMUSCULAR | Status: AC | PRN
  Administered 2020-03-17: 75 mL via INTRAVENOUS

## 2020-03-17 MED ORDER — SUCRALFATE 1 G PO TABS
1.0000 g | ORAL_TABLET | Freq: Four times a day (QID) | ORAL | Status: DC | PRN
  Administered 2020-03-17 – 2020-03-22 (×3): 1 g via ORAL
  Filled 2020-03-17 (×3): qty 1

## 2020-03-17 MED ORDER — SODIUM CHLORIDE 0.9 % IV SOLN: INTRAVENOUS | Status: DC

## 2020-03-17 MED ORDER — PANTOPRAZOLE SODIUM 40 MG PO TBEC
40.0000 mg | DELAYED_RELEASE_TABLET | Freq: Every day | ORAL | Status: DC
  Administered 2020-03-17 – 2020-03-26 (×10): 40 mg via ORAL
  Filled 2020-03-17 (×10): qty 1

## 2020-03-17 MED ORDER — LORAZEPAM 2 MG/ML IJ SOLN: 1.0000 mg | INTRAMUSCULAR | Status: DC | PRN

## 2020-03-17 MED ORDER — ADULT MULTIVITAMIN W/MINERALS CH
1.0000 | ORAL_TABLET | Freq: Every day | ORAL | Status: DC
  Administered 2020-03-18 – 2020-03-26 (×9): 1 via ORAL
  Filled 2020-03-17 (×9): qty 1

## 2020-03-17 MED ORDER — SODIUM CHLORIDE 0.9 % IV SOLN
2.0000 g | INTRAVENOUS | Status: DC
  Administered 2020-03-17 – 2020-03-21 (×5): 2 g via INTRAVENOUS
  Filled 2020-03-17 (×2): qty 2
  Filled 2020-03-17 (×2): qty 20
  Filled 2020-03-17 (×2): qty 2

## 2020-03-17 MED ORDER — ONDANSETRON HCL 4 MG/2ML IJ SOLN
4.0000 mg | Freq: Four times a day (QID) | INTRAMUSCULAR | Status: DC | PRN
  Administered 2020-03-21 – 2020-03-22 (×3): 4 mg via INTRAVENOUS
  Filled 2020-03-17 (×3): qty 2
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
## 2020-03-17 NOTE — ED Notes (Signed)
Date and time results received: 05/15/211609  Test: Lactic Acid Critical Value: 4.7  Name of Provider Notified: Roderic Palau, [place]

## 2020-03-17 NOTE — ED Notes (Signed)
Called lab to collect second set of blood cultures.

## 2020-03-17 NOTE — ED Notes (Signed)
Provider notified of BP of 176/91. Provider states she is placing in orders.

## 2020-03-17 NOTE — ED Notes (Signed)
Pt taken to floor with transport on monitor via stretcher.
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
## 2020-03-17 NOTE — ED Notes (Signed)
Pt was soiled with urine. Pt pad and brief changed at this time. Placed on an external urinary cath.

## 2020-03-17 NOTE — ED Notes (Signed)
Pt c/o headache and asking for Tylenol. Roderic Palau, [place] made aware.

## 2020-03-17 NOTE — ED Notes (Addendum)
Pt reports coming in for lower abdominal pain and not having a bowel movement in 5-6 days.  Pt changed before suppository given and pt had small Bowel Movement. Pt cleaned and new depends placed.  Prior to starting IV antibiotics, NP notified that second set of blood cultures could not be obtained. Provider notified for possible CIWA order as well.

## 2020-03-17 NOTE — ED Triage Notes (Signed)
Pt arrives from home today with the complaint of back pain, pt reported to ems that originally he was having abd pain, pt was found surrounded by empty beer cans pt is also soiled of stool and urine, ems reports that his bed is also saturated with urine

## 2020-03-18 ENCOUNTER — Observation Stay: Payer: Medicare HMO

## 2020-03-18 DIAGNOSIS — I480 Paroxysmal atrial fibrillation: Secondary | ICD-10-CM | POA: Diagnosis present

## 2020-03-18 DIAGNOSIS — A419 Sepsis, unspecified organism: Secondary | ICD-10-CM | POA: Diagnosis present

## 2020-03-18 DIAGNOSIS — I10 Essential (primary) hypertension: Secondary | ICD-10-CM | POA: Diagnosis present

## 2020-03-18 DIAGNOSIS — S06890S Other specified intracranial injury without loss of consciousness, sequela: Secondary | ICD-10-CM | POA: Diagnosis not present

## 2020-03-18 DIAGNOSIS — G8111 Spastic hemiplegia affecting right dominant side: Secondary | ICD-10-CM | POA: Diagnosis present

## 2020-03-18 DIAGNOSIS — X58XXXS Exposure to other specified factors, sequela: Secondary | ICD-10-CM | POA: Diagnosis present

## 2020-03-18 DIAGNOSIS — Z6821 Body mass index (BMI) 21.0-21.9, adult: Secondary | ICD-10-CM | POA: Diagnosis not present

## 2020-03-18 DIAGNOSIS — Z8249 Family history of ischemic heart disease and other diseases of the circulatory system: Secondary | ICD-10-CM | POA: Diagnosis not present

## 2020-03-18 DIAGNOSIS — R5381 Other malaise: Secondary | ICD-10-CM | POA: Diagnosis present

## 2020-03-18 DIAGNOSIS — Z20822 Contact with and (suspected) exposure to covid-19: Secondary | ICD-10-CM | POA: Diagnosis present

## 2020-03-18 DIAGNOSIS — Z801 Family history of malignant neoplasm of trachea, bronchus and lung: Secondary | ICD-10-CM | POA: Diagnosis not present

## 2020-03-18 DIAGNOSIS — Y908 Blood alcohol level of 240 mg/100 ml or more: Secondary | ICD-10-CM | POA: Diagnosis present

## 2020-03-18 DIAGNOSIS — F10220 Alcohol dependence with intoxication, uncomplicated: Secondary | ICD-10-CM | POA: Diagnosis present

## 2020-03-18 DIAGNOSIS — E43 Unspecified severe protein-calorie malnutrition: Secondary | ICD-10-CM | POA: Diagnosis present

## 2020-03-18 DIAGNOSIS — Z9181 History of falling: Secondary | ICD-10-CM | POA: Diagnosis not present

## 2020-03-18 DIAGNOSIS — R001 Bradycardia, unspecified: Secondary | ICD-10-CM | POA: Diagnosis present

## 2020-03-18 DIAGNOSIS — F1010 Alcohol abuse, uncomplicated: Secondary | ICD-10-CM | POA: Diagnosis present

## 2020-03-18 DIAGNOSIS — K6289 Other specified diseases of anus and rectum: Secondary | ICD-10-CM | POA: Diagnosis present

## 2020-03-18 DIAGNOSIS — E860 Dehydration: Secondary | ICD-10-CM | POA: Diagnosis present

## 2020-03-18 DIAGNOSIS — Z79899 Other long term (current) drug therapy: Secondary | ICD-10-CM | POA: Diagnosis not present

## 2020-03-18 DIAGNOSIS — K5641 Fecal impaction: Secondary | ICD-10-CM | POA: Diagnosis present

## 2020-03-18 DIAGNOSIS — K219 Gastro-esophageal reflux disease without esophagitis: Secondary | ICD-10-CM | POA: Diagnosis present

## 2020-03-18 DIAGNOSIS — Z87891 Personal history of nicotine dependence: Secondary | ICD-10-CM | POA: Diagnosis not present

## 2020-03-18 DIAGNOSIS — E871 Hypo-osmolality and hyponatremia: Secondary | ICD-10-CM | POA: Diagnosis present

## 2020-03-18 LAB — CBC
HCT: 36.1 % — ABNORMAL LOW (ref 39.0–52.0)
Hemoglobin: 12.8 g/dL — ABNORMAL LOW (ref 13.0–17.0)
MCH: 32 pg (ref 26.0–34.0)
MCHC: 35.5 g/dL (ref 30.0–36.0)
MCV: 90.3 fL (ref 80.0–100.0)
Platelets: 211 10*3/uL (ref 150–400)
RBC: 4 MIL/uL — ABNORMAL LOW (ref 4.22–5.81)
RDW: 12.7 % (ref 11.5–15.5)
WBC: 9.3 10*3/uL (ref 4.0–10.5)
nRBC: 0 % (ref 0.0–0.2)

## 2020-03-18 LAB — BASIC METABOLIC PANEL
Anion gap: 8 (ref 5–15)
BUN: 6 mg/dL — ABNORMAL LOW (ref 8–23)
CO2: 27 mmol/L (ref 22–32)
Calcium: 8 mg/dL — ABNORMAL LOW (ref 8.9–10.3)
Chloride: 101 mmol/L (ref 98–111)
Creatinine, Ser: 0.47 mg/dL — ABNORMAL LOW (ref 0.61–1.24)
GFR calc Af Amer: >60 mL/min (ref >60)
GFR calc non Af Amer: >60 mL/min (ref >60)
Glucose, Bld: 107 mg/dL — ABNORMAL HIGH (ref 70–99)
Potassium: 3.6 mmol/L (ref 3.5–5.1)
Sodium: 136 mmol/L (ref 135–145)

## 2020-03-18 LAB — LACTIC ACID, PLASMA: Lactic Acid, Venous: 1.7 mmol/L (ref 0.5–1.9)

## 2020-03-18 MED ORDER — SODIUM CHLORIDE 0.9 % IV SOLN
INTRAVENOUS | Status: DC | PRN
  Administered 2020-03-18 – 2020-03-22 (×3): 250 mL via INTRAVENOUS

## 2020-03-18 MED ORDER — HYDROCORT-PRAMOXINE (PERIANAL) 1-1 % EX FOAM
1.0000 | Freq: Two times a day (BID) | CUTANEOUS | Status: DC | PRN
  Administered 2020-03-18: 1 via RECTAL
  Filled 2020-03-18: qty 10

## 2020-03-18 MED ORDER — HYDRALAZINE HCL 25 MG PO TABS
25.0000 mg | ORAL_TABLET | Freq: Three times a day (TID) | ORAL | Status: DC | PRN
  Administered 2020-03-19: 25 mg via ORAL
  Filled 2020-03-18: qty 1

## 2020-03-18 MED ORDER — ATENOLOL 50 MG PO TABS
50.0000 mg | ORAL_TABLET | Freq: Every day | ORAL | Status: DC
  Administered 2020-03-18 – 2020-03-19 (×2): 50 mg via ORAL
  Filled 2020-03-18 (×3): qty 1

## 2020-03-18 MED ORDER — HYDROMORPHONE HCL 1 MG/ML IJ SOLN: 0.3000 mg | INTRAMUSCULAR | Status: DC | PRN
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
## 2020-03-18 NOTE — ED Provider Notes (Signed)
   [facility] Emergency Department Provider Note   PROCEDURES  Procedure(s) performed (including Critical Care):  .Critical Care Performed by: Vanessa [place], MD Authorized by: Vanessa Cone[place], MD   Critical care provider statement:    Critical care time (minutes):  35   Critical care was necessary to treat or prevent imminent or life-threatening deterioration of the following conditions:  Sepsis   Critical care was time spent personally by me on the following activities:  Discussions with consultants, evaluation of patient's response to treatment, examination of patient, ordering and performing treatments and interventions, ordering and review of laboratory studies, ordering and review of radiographic studies, pulse oximetry, re-evaluation of patient's condition, obtaining history from patient or surrogate and review of old charts     ____________________________________________    Vanessa Alva, MD 03/18/20 [phone]

## 2020-03-18 NOTE — Progress Notes (Signed)
PROGRESS NOTE    Douglas Peterson.  OLM:[id number] DOB: 04/30/1951 DOA: 03/17/2020 PCP: [name], MD      Brief Narrative:  [name] is a 69 y.o. M with hx of alcohol use disorder, HTN, TBI with right-sided hemiparesis, recent admission for intoxicating complicated by refeeding syndrome and atrial fibrillation who presents with a few days progressive lower abdominal pain, urge to defecate and constipation, now severe malaise.  Patient for started to notice constipation and pain in his rectum a few days ago, as if he had the urge to poop but could not.  This progressively got worse and he started to feel generalized malaise so he called 911 today.  EMS found him in bed, covered in urine, surrounded by beer cans and brought him to the ER.  In the ER afebrile, heart rate 105, respirations 24, blood pressure normal, pulse ox normal.  WBC 19 K, sodium 128, anion gap elevated, lactate 4.7.  Alcohol level is 266.  CT head and C-spine unremarkable, CT abdomen pelvis showed large volume of impacted stool in the colon, proctitis.  Chest x-ray clear, urinalysis without cells.  He was given 30 cc per cake IV fluids, and Zosyn was given empirically.         Assessment & Plan:  Possible sepsis due to proctitis Patient presented with tachycardia, leukocytosis, tachypnea, and lactate 5.  CT showed proctitis doubt abscess or fluid collection or cellulitis, and the patient had severe rectal pain.  No urinary symptoms to suggest UTI, UA without pyuria.  Started on IV antibiotics overnight, and today he is still tachycardic to 108.  Culture still pending. -Continue ceftriaxone and Flagyl, day 2 -Follow blood cultures     Severe constipation Abdominal x-ray personally reviewed, shows about half of stool still present in his stool impaction. -Continue GoLYTELY  Alcoholism Scores of 7-12 -Continue CIWA scoring -Continue on command diazepam -Continue thiamine and folate  Paroxysmal  atrial fibrillation Not on anticoagulation due to history of thrombocytopenia, ongoing alcohol use, falls.  Hypertension Blood pressure elevated -Resume atenolol -As needed hydralazine for severe depression  Hyponatremia -Resolved with fluids  History of traumatic brain injury -Continue home baclofen  GERD -Continue home pantoprazole   Severe protein calorie malnutrition As evidenced by chronic illness, BMI 20, and severe loss of subcutaneous muscle mass and fat.         Disposition: Status is: Observation  The patient will require care spanning > 2 midnights and should be moved to inpatient because: He presented with stool impaction leading to proctitis and sepsis, and by abdominal x-ray, this infection is not yet resolved, and he also still has heart rate 100, hemodynamics suggest ongoing inpatient cares are reasonable and necessary  Dispo: The patient is from: Home              Anticipated d/c is to: SNF              Anticipated d/c date is: 2 days              Patient currently is not medically stable to d/c.              MDM: The below labs and imaging reports were reviewed and summarized above.  Medication management as above.   DVT prophylaxis: Lovenox Code Status: Full code Family Communication: Spoke with daughter yesterday evening    Consultants:     Procedures:     Antimicrobials:   Ceftriaxone/Flagyl 5/15>>  Culture data:  5/15 blood cultures x2-no growth to date          Subjective: Patient still has severe rectal pain although this is improving.  He has had no fever overnight.  He still feels somewhat constipated although he has had several bowel movements overnight.  No confusion.  No chest pain.  He appears flushed and tachycardic.  Objective: Vitals:   03/18/20 0215 03/18/20 0326 03/18/20 0750 03/18/20 1150  BP: 129/66 (!) 141/74 132/78 (!) 176/99  Pulse: 98 98 93 (!) 108  Resp:  18 14 17   Temp:  98.9 F  (37.2 C) 98.2 F (36.8 C) 98.2 F (36.8 C)  TempSrc:  Oral    SpO2:  96% 94% 95%  Weight:  53.6 kg    Height:        Intake/Output Summary (Last 24 hours) at 03/18/2020 1338 Last data filed at 03/18/2020 0600 Gross per 24 hour  Intake 3654.02 ml  Output --  Net 3654.02 ml   Filed Weights   03/17/20 1335 03/18/20 0326  Weight: 50.8 kg 53.6 kg    Examination: General appearance: Thin chronically ill-appearing adult male, alert and in moderate distress from constipation and alcohol withdrawal.   HEENT: Anicteric, conjunctiva inflamed, lids and lashes normal. No nasal deformity, discharge, epistaxis.  Lips moist, partially edentulous, oropharynx moist, no oral lesions, hearing diminished.   Skin:, Warm, moist, no jaundice.  No suspicious rashes or lesions. Cardiac: Tachycardic, regular, nl S1-S2, no murmurs appreciated.  Capillary refill is brisk.  JVP not visible.  No LE edema.  Radial pulses 2+ and symmetric. Respiratory: Normal respiratory rate and rhythm.  CTAB without rales or wheezes. Abdomen: Abdomen soft.  Moderate nonfocal TTP positive guarding. No ascites, distension, hepatosplenomegaly.   MSK: No deformities or effusions.  Diffuse severe loss of subcutaneous muscle mass and fat. Neuro: Awake and alert.  EOMI, moves all extremities with generalized weakness. Speech fluent.   Mild tremor. Psych: Sensorium intact and responding to questions, attention normal. Affect blunted.  Judgment and insight appear mildly impaired.    Data Reviewed: I have personally reviewed following labs and imaging studies:  CBC: Recent Labs  Lab 03/17/20 1430 03/18/20 0320  WBC 19.5* 9.3  NEUTROABS 8.8*  --   HGB 16.9 12.8*  HCT 49.4 36.1*  MCV 90.6 90.3  PLT 375 809   Basic Metabolic Panel: Recent Labs  Lab 03/17/20 1430 03/17/20 1626 03/18/20 0320  NA 128*  --  136  K 4.1  --  3.6  CL 86*  --  101  CO2 26  --  27  GLUCOSE 113*  --  107*  BUN 6*  --  6*  CREATININE 0.53*  --   0.47*  CALCIUM 8.9  --  8.0*  MG 2.0  --   --   PHOS  --  3.2  --    GFR: Estimated Creatinine Clearance: 67 mL/min (A) (by C-G formula based on SCr of 0.47 mg/dL (L)). Liver Function Tests: Recent Labs  Lab 03/17/20 1430  AST 36  ALT 25  ALKPHOS 96  BILITOT 1.1  PROT 8.4*  ALBUMIN 4.6   Recent Labs  Lab 03/17/20 1430  LIPASE 22   Recent Labs  Lab 03/17/20 1430  AMMONIA 14   Coagulation Profile: No results for input(s): INR, PROTIME in the last 168 hours. Cardiac Enzymes: Recent Labs  Lab 03/17/20 1430  CKTOTAL 88   BNP (last 3 results) No results for input(s): PROBNP in the last 8760 hours.  HbA1C: No results for input(s): HGBA1C in the last 72 hours. CBG: No results for input(s): GLUCAP in the last 168 hours. Lipid Profile: No results for input(s): CHOL, HDL, LDLCALC, TRIG, CHOLHDL, LDLDIRECT in the last 72 hours. Thyroid Function Tests: No results for input(s): TSH, T4TOTAL, FREET4, T3FREE, THYROIDAB in the last 72 hours. Anemia Panel: No results for input(s): VITAMINB12, FOLATE, FERRITIN, TIBC, IRON, RETICCTPCT in the last 72 hours. Urine analysis:    Component Value Date/Time   COLORURINE YELLOW (A) 03/17/2020 1346   APPEARANCEUR HAZY (A) 03/17/2020 1346   LABSPEC 1.029 03/17/2020 1346   PHURINE 5.0 03/17/2020 1346   GLUCOSEU NEGATIVE 03/17/2020 1346   HGBUR NEGATIVE 03/17/2020 1346   BILIRUBINUR NEGATIVE 03/17/2020 1346   KETONESUR 5 (A) 03/17/2020 1346   PROTEINUR 100 (A) 03/17/2020 1346   NITRITE NEGATIVE 03/17/2020 1346   LEUKOCYTESUR NEGATIVE 03/17/2020 1346   Sepsis Labs: @LABRCNTIP (procalcitonin:4,lacticacidven:4)  ) Recent Results (from the past 240 hour(s))  SARS Coronavirus 2 by RT PCR (hospital order, performed in [place] hospital lab) Nasopharyngeal Nasopharyngeal Swab     Status: None   Collection Time: 03/17/20  2:30 PM   Specimen: Nasopharyngeal Swab  Result Value Ref Range Status   SARS Coronavirus 2 NEGATIVE NEGATIVE  Final    Comment: (NOTE) SARS-CoV-2 target nucleic acids are NOT DETECTED. The SARS-CoV-2 RNA is generally detectable in upper and lower respiratory specimens during the acute phase of infection. The lowest concentration of SARS-CoV-2 viral copies this assay can detect is 250 copies / mL. A negative result does not preclude SARS-CoV-2 infection and should not be used as the sole basis for treatment or other patient management decisions.  A negative result may occur with improper specimen collection / handling, submission of specimen other than nasopharyngeal swab, presence of viral mutation(s) within the areas targeted by this assay, and inadequate number of viral copies (<250 copies / mL). A negative result must be combined with clinical observations, patient history, and epidemiological information. Fact Sheet for Patients:   StrictlyIdeas.no Fact Sheet for Healthcare Providers: BankingDealers.co.za This test is not yet approved or cleared  by the [place] FDA and has been authorized for detection and/or diagnosis of SARS-CoV-2 by FDA under an Emergency Use Authorization (EUA).  This EUA will remain in effect (meaning this test can be used) for the duration of the COVID-19 declaration under Section 564(b)(1) of the Act, 21 U.S.C. section 360bbb-3(b)(1), unless the authorization is terminated or revoked sooner. Performed at [facility], [address] [place], [place] [address]   CULTURE, BLOOD (ROUTINE X 2) w Reflex to ID Panel     Status: None (Preliminary result)   Collection Time: 03/17/20  2:30 PM   Specimen: BLOOD  Result Value Ref Range Status   Specimen Description BLOOD BLOOD LEFT FOREARM  Final   Special Requests   Final    BOTTLES DRAWN AEROBIC AND ANAEROBIC Blood Culture adequate volume   Culture   Final    NO GROWTH < 12 HOURS Performed at [facility], [address] [place], [place]  [address]    Report Status PENDING  Incomplete  CULTURE, BLOOD (ROUTINE X 2) w Reflex to ID Panel     Status: None (Preliminary result)   Collection Time: 03/18/20  3:22 AM   Specimen: BLOOD  Result Value Ref Range Status   Specimen Description BLOOD BLOOD LEFT WRIST  Final   Special Requests   Final    BOTTLES DRAWN AEROBIC AND ANAEROBIC  Blood Culture results may not be optimal due to an excessive volume of blood received in culture bottles   Culture   Final    NO GROWTH < 12 HOURS Performed at [facility], [address] [place], [place] [address]    Report Status PENDING  Incomplete         Radiology Studies: DG Chest 1 View  Result Date: 03/17/2020 CLINICAL DATA:  Weakness for several days EXAM: CHEST  1 VIEW COMPARISON:  01/17/2020 FINDINGS: Chronically diminished lung volumes with bandlike areas of atelectasis in the lung bases. Central vascular crowding is noted. No consolidation, features of edema, pneumothorax, or effusion. The cardiomediastinal contours are unremarkable. Gaseous distention of the stomach is noted with layering ingested material. No other acute osseous or soft tissue abnormality. Telemetry leads overlie the chest. IMPRESSION: Low lung volumes with bandlike areas of atelectasis in the lung bases. Distended stomach, correlate for abdominal symptoms. Electronically Signed   By: Lovena Le M.D.   On: 03/17/2020 15:55   DG Abd 1 View  Result Date: 03/18/2020 CLINICAL DATA:  Constipation. Patient reports too painful bowel movements today. EXAM: ABDOMEN - 1 VIEW COMPARISON:  03/17/2020 FINDINGS: Gaseous distension of the small and large bowel loops again noted. When compared with the previous exam there is been decrease caliber of the descending colon. Persistent but improved stool burden noted within the rectum. No free intraperitoneal air. IMPRESSION: 1. Persistent but improved stool burden within the rectum. 2. Improvement in colonic distention.  Electronically Signed   By: Kerby Moors M.D.   On: 03/18/2020 11:23   CT Head Wo Contrast  Result Date: 03/17/2020 CLINICAL DATA:  Headache. Suspected trauma. EXAM: CT HEAD WITHOUT CONTRAST CT CERVICAL SPINE WITHOUT CONTRAST TECHNIQUE: Multidetector CT imaging of the head and cervical spine was performed following the standard protocol without intravenous contrast. Multiplanar CT image reconstructions of the cervical spine were also generated. COMPARISON:  02/23/2020 FINDINGS: CT HEAD FINDINGS Brain: No evidence of acute infarction, hemorrhage, hydrocephalus, extra-axial collection or mass lesion/mass effect. There is prominence of the sulci and ventricles compatible with brain atrophy. Vascular: No hyperdense vessel or unexpected calcification. Skull: Normal. Negative for fracture or focal lesion. Sinuses/Orbits: No acute finding. Other: None. CT CERVICAL SPINE FINDINGS Alignment: Normal. Skull base and vertebrae: No acute fracture. No primary bone lesion or focal pathologic process. Soft tissues and spinal canal: No prevertebral fluid or swelling. No visible canal hematoma. Disc levels: There is fusion of the C5-6 disc space. Disc space narrowing and endplate spurring noted C4-5 and C6-7. Upper chest: Negative. Other: None IMPRESSION: 1. No acute intracranial abnormalities. 2. Chronic brain atrophy. 3. No evidence for cervical spine fracture. 4. Cervical degenerative disc disease. Electronically Signed   By: Kerby Moors M.D.   On: 03/17/2020 15:53   CT Cervical Spine Wo Contrast  Result Date: 03/17/2020 CLINICAL DATA:  Headache. Suspected trauma. EXAM: CT HEAD WITHOUT CONTRAST CT CERVICAL SPINE WITHOUT CONTRAST TECHNIQUE: Multidetector CT imaging of the head and cervical spine was performed following the standard protocol without intravenous contrast. Multiplanar CT image reconstructions of the cervical spine were also generated. COMPARISON:  02/23/2020 FINDINGS: CT HEAD FINDINGS Brain: No evidence  of acute infarction, hemorrhage, hydrocephalus, extra-axial collection or mass lesion/mass effect. There is prominence of the sulci and ventricles compatible with brain atrophy. Vascular: No hyperdense vessel or unexpected calcification. Skull: Normal. Negative for fracture or focal lesion. Sinuses/Orbits: No acute finding. Other: None. CT CERVICAL SPINE FINDINGS Alignment: Normal. Skull base and  vertebrae: No acute fracture. No primary bone lesion or focal pathologic process. Soft tissues and spinal canal: No prevertebral fluid or swelling. No visible canal hematoma. Disc levels: There is fusion of the C5-6 disc space. Disc space narrowing and endplate spurring noted C4-5 and C6-7. Upper chest: Negative. Other: None IMPRESSION: 1. No acute intracranial abnormalities. 2. Chronic brain atrophy. 3. No evidence for cervical spine fracture. 4. Cervical degenerative disc disease. Electronically Signed   By: Kerby Moors M.D.   On: 03/17/2020 15:53   CT ABDOMEN PELVIS W CONTRAST  Result Date: 03/17/2020 CLINICAL DATA:  Patient originally was having abdominal pain, now complaining of back pain. EXAM: CT ABDOMEN AND PELVIS WITH CONTRAST TECHNIQUE: Multidetector CT imaging of the abdomen and pelvis was performed using the standard protocol following bolus administration of intravenous contrast. CONTRAST:  76m OMNIPAQUE IOHEXOL 300 MG/ML  SOLN COMPARISON:  01/17/2020 FINDINGS: Lower chest: There is opacity at the bases of the right lower lobe and right middle lobe and minimally of the left lower lobe, consistent with atelectasis/scarring, similar to the prior CT. Heart normal in size. The distal esophagus is distended with evidence of wall thickening. Hepatobiliary: Liver normal in size. Decreased attenuation of the liver consistent with fatty infiltration. No mass or focal lesion. Normal gallbladder. No bile duct dilation. Pancreas: Unremarkable. No pancreatic ductal dilatation or surrounding inflammatory changes.  Spleen: Normal in size without focal abnormality. Adrenals/Urinary Tract: No adrenal masses. Kidneys are normal in size, orientation and position with symmetric enhancement and excretion. No renal masses, stones or hydronephrosis. There is mild dilation of the ureters. No ureteral stones. Bladder is unremarkable. Stomach/Bowel: Rectum is significantly distended with stool, measuring 8.2 cm transversely. Rectal wall appears mildly thickened. Subtle adjacent fat inflammation. Distal sigmoid colon is distended with stool. Proximal sigmoid colon enters a left inguinal hernia without evidence of obstruction, incarceration or strangulation. Remainder of the colon is unremarkable. Stomach is moderately distended with fluid and nondependent air. No stomach wall thickening or inflammation. Normal small bowel. No evidence of appendicitis. Vascular/Lymphatic: Mild aortic atherosclerotic calcifications. No aneurysm. Portal vein widely patent. Reproductive: Unremarkable. Other: No ascites. Musculoskeletal: Old fractures of L1 and L2, L2 previously treated with vertebroplasty. Old mild depression of the upper endplate T12. Mild wedge-shaped deformity of T8. These findings are all stable from the prior CT. No acute fracture. No osteoblastic or osteolytic lesions. IMPRESSION: 1. Rectum and lower sigmoid colon are impacted with stool, rectum moderately distended. There is also mild wall thickening of the rectum with subtle adjacent inflammation consistent with proctitis. 2. Left inguinal hernia containing the proximal sigmoid colon, without evidence of obstruction, incarceration or strangulation. 3. Dilated distal esophagus with evidence wall thickening. Consider esophagitis in the proper clinical setting. 4. Hepatic steatosis. 5. Chronic vertebral compression fractures. Right greater than left lung base opacities consistent with atelectasis stable from the prior CT. Electronically Signed   By: DLajean ManesM.D.   On: 03/17/2020  15:51        Scheduled Meds: . baclofen  10 mg Oral TID  . enoxaparin (LOVENOX) injection  40 mg Subcutaneous Q24H  . folic acid  1 mg Oral Daily  . multivitamin with minerals  1 tablet Oral Daily  . pantoprazole  40 mg Oral Daily  . thiamine  100 mg Oral Daily   Or  . thiamine  100 mg Intravenous Daily   Continuous Infusions: . cefTRIAXone (ROCEPHIN)  IV Stopped (03/17/20 1953)  . metronidazole 500 mg (03/18/20 1016)  LOS: 0 days    Time spent: 25 minutes    Edwin Dada, MD [facility] 03/18/2020, 1:38 PM     Please page though [place] or Epic secure chat:  For [employer], Adult nurse

## 2020-03-19 LAB — CBC
HCT: 37.2 % — ABNORMAL LOW (ref 39.0–52.0)
Hemoglobin: 12.3 g/dL — ABNORMAL LOW (ref 13.0–17.0)
MCH: 31.1 pg (ref 26.0–34.0)
MCHC: 33.1 g/dL (ref 30.0–36.0)
MCV: 94.2 fL (ref 80.0–100.0)
Platelets: 141 10*3/uL — ABNORMAL LOW (ref 150–400)
RBC: 3.95 MIL/uL — ABNORMAL LOW (ref 4.22–5.81)
RDW: 12.9 % (ref 11.5–15.5)
WBC: 4.6 10*3/uL (ref 4.0–10.5)
nRBC: 0 % (ref 0.0–0.2)

## 2020-03-19 LAB — COMPREHENSIVE METABOLIC PANEL
ALT: 20 U/L (ref 0–44)
AST: 23 U/L (ref 15–41)
Albumin: 3.5 g/dL (ref 3.5–5.0)
Alkaline Phosphatase: 73 U/L (ref 38–126)
Anion gap: 8 (ref 5–15)
BUN: <5 mg/dL — ABNORMAL LOW (ref 8–23)
CO2: 27 mmol/L (ref 22–32)
Calcium: 8.4 mg/dL — ABNORMAL LOW (ref 8.9–10.3)
Chloride: 105 mmol/L (ref 98–111)
Creatinine, Ser: <0.3 mg/dL — ABNORMAL LOW (ref 0.61–1.24)
Glucose, Bld: 98 mg/dL (ref 70–99)
Potassium: 3.5 mmol/L (ref 3.5–5.1)
Sodium: 140 mmol/L (ref 135–145)
Total Bilirubin: 0.8 mg/dL (ref 0.3–1.2)
Total Protein: 6.4 g/dL — ABNORMAL LOW (ref 6.5–8.1)

## 2020-03-19 LAB — PATHOLOGIST SMEAR REVIEW

## 2020-03-19 LAB — PHOSPHORUS: Phosphorus: 2.5 mg/dL (ref 2.5–4.6)

## 2020-03-19 LAB — MAGNESIUM: Magnesium: 1.8 mg/dL (ref 1.7–2.4)

## 2020-03-19 MED ORDER — ENSURE ENLIVE PO LIQD
237.0000 mL | Freq: Two times a day (BID) | ORAL | Status: DC
  Administered 2020-03-20 – 2020-03-26 (×10): 237 mL via ORAL

## 2020-03-19 MED ORDER — NALTREXONE HCL 50 MG PO TABS
50.0000 mg | ORAL_TABLET | Freq: Every day | ORAL | Status: DC
  Administered 2020-03-19 – 2020-03-21 (×3): 50 mg via ORAL
  Filled 2020-03-19 (×4): qty 1

## 2020-03-19 NOTE — TOC Initial Note (Signed)
Transition of Care (TOC) - Initial/Assessment Note    Patient Details  Name: Douglas Peterson. MRN: [id number] Date of Birth: 1951/02/19  Transition of Care [facility]) CM/SW Contact:    Victorino Dike, RN Phone Number: 03/19/2020, 9:35 AM  Clinical Narrative:                  Spoke with patient who reports living at home alone, he is not confident in self care at this time and would like to go to short term rehab.  He is agreeable to a bedsearch for this at this time.  FL2 complete, bed search initiated.   Expected Discharge Plan: Skilled Nursing Facility Barriers to Discharge: Barriers Resolved   Patient Goals and CMS Choice Patient states their goals for this hospitalization and ongoing recovery are:: SNF at discharge for rehab CMS Medicare.gov Compare Post Acute Care list provided to:: Patient Choice offered to / list presented to : Patient  Expected Discharge Plan and Services Expected Discharge Plan: [place]   Discharge Planning Services: CM Consult   Living arrangements for the past 2 months: Single Family Home                   DME Agency: NA                  Prior Living Arrangements/Services Living arrangements for the past 2 months: Single Family Home Lives with:: Self Patient language and need for interpreter reviewed:: Yes        Need for Family Participation in Patient Care: No (Comment) Care giver support system in place?: No (comment) Current home services: DME(walker, cane) Criminal Activity/Legal Involvement Pertinent to Current Situation/Hospitalization: No - Comment as needed  Activities of Daily Living Home Assistive Devices/Equipment: Environmental consultant (specify type), Cane (specify quad or straight) ADL Screening (condition at time of admission) Patient's cognitive ability adequate to safely complete daily activities?: Yes Is the patient deaf or have difficulty hearing?: No Does the patient have difficulty seeing, even when wearing  glasses/contacts?: No Does the patient have difficulty concentrating, remembering, or making decisions?: No Patient able to express need for assistance with ADLs?: Yes Does the patient have difficulty dressing or bathing?: No Independently performs ADLs?: Yes (appropriate for developmental age) Does the patient have difficulty walking or climbing stairs?: Yes Weakness of Legs: Both Weakness of Arms/Hands: Both  Permission Sought/Granted                  Emotional Assessment       Orientation: : Oriented to Self, Oriented to Place, Oriented to  Time, Oriented to Situation Alcohol / Substance Use: Not Applicable Psych Involvement: No (comment)  Admission diagnosis:  Alcohol abuse [F10.10] Hyponatremia [E87.1] Sepsis ([place]) [A41.9] Sepsis without acute organ dysfunction, due to unspecified organism [facility]) [A41.9] Patient Active Problem List   Diagnosis Date Noted  . Sepsis ([place]) 03/17/2020  . Constipation 03/17/2020  . AF (paroxysmal atrial fibrillation) ([place]) 03/17/2020  . Protein-calorie malnutrition, severe 02/25/2020  . Left inguinal hernia 01/31/2020  . Encounter for competency evaluation   . GERD (gastroesophageal reflux disease) 01/17/2020  . Depression 01/17/2020  . Esophagitis   . Nausea without vomiting   . Weakness   . Tremor   . Pancytopenia ([place])   . Hypomagnesemia   . Alcoholic cirrhosis of liver without ascites ([place])   . Thrombocytopenia ([place])   . Non-traumatic rhabdomyolysis   . Alcohol withdrawal delirium ([place]) 12/08/2019  . Alcohol abuse   .  Alcohol intoxication, uncomplicated ([place])   . 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 Hypokalemia 09/27/2019  . Dysphagia 09/27/2019  . Alcohol withdrawal delirium, acute, hyperactive ([place]) 09/21/2019  . Pressure injury of skin 08/15/2019  . Goals of care, counseling/discussion   . Palliative care by specialist   . Alcohol withdrawal ([place]) 03/20/2019  . Low back pain  11/15/2018  . Acute metabolic encephalopathy 38/46/6599  . Delirium tremens ([place]) 03/08/2018  . Malnutrition of moderate degree 11/24/2017  . HTN (hypertension) 09/16/2017  . Hypothermia 12/17/2016  . Alcohol use   . Diarrhea   . Hyponatremia 07/09/2015   PCP:  [name], MD Pharmacy:   [facility] [address] [place] [address] [place] [address] Phone: [phone] Fax: [phone]     Social Determinants of Health (SDOH) Interventions    Readmission Risk Interventions Readmission Risk Prevention Plan 12/13/2019 12/12/2019  Transportation Screening Complete Complete  PCP or Specialist Appt within 3-5 Days (No Data) -  HRI or [place] (No Data) -  Palliative Care Screening Complete Complete  Medication Review (RN Care Manager) Complete Complete  Some recent data might be hidden

## 2020-03-19 NOTE — Plan of Care (Signed)
  Problem: Clinical Measurements: Goal: Ability to maintain clinical measurements within normal limits will improve Outcome: Progressing Goal: Respiratory complications will improve Outcome: Progressing Goal: Cardiovascular complication will be avoided Outcome: Progressing   Problem: Activity: Goal: Risk for activity intolerance will decrease Outcome: Progressing   Problem: Fluid Volume: Goal: Hemodynamic stability will improve Outcome: Progressing   Problem: Clinical Measurements: Goal: Signs and symptoms of infection will decrease Outcome: Progressing

## 2020-03-19 NOTE — Progress Notes (Signed)
Physical Therapy Treatment Patient Details Name: Douglas Peterson. MRN: [id number] DOB: [birth year] Today's Date: 03/19/2020    History of Present Illness  69 y.o. M with hx of alcohol use disorder, HTN, TBI with right-sided hemiparesis, recent admission for intoxicating complicated by refeeding syndrome and atrial fibrillation who presents with a few days progressive lower abdominal pain, urge to defecate and constipation, now severe malaise.  Pt had not had a BM for ~1 week and is endorsing consistent and signifcant rectal pain.  Pt was d/c to rehab 4/29 after a fall at home, found by EMS 5/15 at home, in bed, covered in urine and surrounded by empty beer cans.    PT Comments    Pt pleasant and motivated to participate during the session.  Pt put forth very good effort during the session and was able to perform both sit to/from stand transfers and ambulate 15' with a SPC with occasional min A for stability.  [name] SpO2 and HR were WNL during the session with no adverse symptoms noted.  Pt will benefit from PT services in a SNF setting upon discharge to safely address deficits listed in patient problem list for decreased caregiver assistance and eventual return to PLOF.     Follow Up Recommendations  SNF     Equipment Recommendations  None recommended by PT    Recommendations for Other Services       Precautions / Restrictions Precautions Precautions: Fall Restrictions Weight Bearing Restrictions: No    Mobility  Bed Mobility Overal bed mobility: Needs Assistance Bed Mobility: Supine to Sit;Sit to Supine     Supine to sit: Supervision Sit to supine: Supervision   General bed mobility comments: LUE used heavily on the bed rail with extra time and effort required  Transfers Overall transfer level: Needs assistance Equipment used: Straight cane Transfers: Sit to/from Stand Sit to Stand: Min assist;From elevated surface         General transfer comment: Min A for  stability but no physical assist required to come to standing  Ambulation/Gait Ambulation/Gait assistance: Min assist Gait Distance (Feet): 15 Feet Assistive device: Straight cane Gait Pattern/deviations: Step-to pattern Gait velocity: decreased   General Gait Details: Slow, step-to cadence with min A for stability   Stairs             Wheelchair Mobility    Modified Rankin (Stroke Patients Only)       Balance Overall balance assessment: Needs assistance Sitting-balance support: Feet unsupported;No upper extremity supported Sitting balance-Leahy Scale: Good Sitting balance - Comments: Steady static sitting, reaching within BOS   Standing balance support: During functional activity;Single extremity supported Standing balance-Leahy Scale: Poor Standing balance comment: Min A for stability in standing                            Cognition Arousal/Alertness: Awake/alert Behavior During Therapy: WFL for tasks assessed/performed Overall Cognitive Status: Within Functional Limits for tasks assessed                                        Exercises Total Joint Exercises Ankle Circles/Pumps: Strengthening;Both;10 reps Quad Sets: Strengthening;Both;10 reps Hip ABduction/ADduction: Strengthening;Both;10 reps Long Arc Quad: Strengthening;Both;10 reps Knee Flexion: Strengthening;Both;10 reps Marching in Standing: AROM;Strengthening;Both;10 reps Other Exercises Other Exercises: Sit to/from stand transfer training from various height surfaces Other Exercises: Bed mobility training with  rolling left/right and sup to/from sit    General Comments        Pertinent Vitals/Pain Pain Assessment: No/denies pain    Home Living                      Prior Function            PT Goals (current goals can now be found in the care plan section) Progress towards PT goals: Progressing toward goals    Frequency    Min 2X/week      PT  Plan Current plan remains appropriate    Co-evaluation              AM-PAC PT "6 Clicks" Mobility   Outcome Measure  Help needed turning from your back to your side while in a flat bed without using bedrails?: A Little Help needed moving from lying on your back to sitting on the side of a flat bed without using bedrails?: A Little Help needed moving to and from a bed to a chair (including a wheelchair)?: A Little Help needed standing up from a chair using your arms (e.g., wheelchair or bedside chair)?: A Little Help needed to walk in hospital room?: A Little Help needed climbing 3-5 steps with a railing? : A Lot 6 Click Score: 17    End of Session Equipment Utilized During Treatment: Gait belt Activity Tolerance: Patient tolerated treatment well Patient left: with bed alarm set;with call bell/phone within reach;in bed;with nursing/sitter in room Nurse Communication: Mobility status PT Visit Diagnosis: Muscle weakness (generalized) (M62.81);Difficulty in walking, not elsewhere classified (R26.2);Unsteadiness on feet (R26.81);Repeated falls (R29.6)     Time: 4827-0786 PT Time Calculation (min) (ACUTE ONLY): 31 min  Charges:  $Therapeutic Exercise: 8-22 mins $Therapeutic Activity: 8-22 mins                     D. Scott  PT, DPT 03/19/20, 3:33 PM

## 2020-03-19 NOTE — NC FL2 (Signed)
[place] LEVEL OF CARE SCREENING TOOL     IDENTIFICATION  Patient Name: Douglas Peterson. Birthdate: [birth year] Sex: male Admission Date (Current Location): 03/17/2020  [place] and [place] Number:  Engineering geologist and Address:  [facility], [address] [place], [place] [address]      Provider Number: [id number]  Attending Physician Name and Address:  Edwin Dada, *  Relative Name and Phone Number:  [name] [phone]    Current Level of Care: Hospital Recommended Level of Care:   Prior Approval Number:    Date Approved/Denied:   PASRR Number:    Discharge Plan: SNF    Current Diagnoses: Patient Active Problem List   Diagnosis Date Noted  . Sepsis ([place]) 03/17/2020  . Constipation 03/17/2020  . AF (paroxysmal atrial fibrillation) ([place]) 03/17/2020  . Protein-calorie malnutrition, severe 02/25/2020  . Left inguinal hernia 01/31/2020  . Encounter for competency evaluation   . GERD (gastroesophageal reflux disease) 01/17/2020  . Depression 01/17/2020  . Esophagitis   . Nausea without vomiting   . Weakness   . Tremor   . Pancytopenia ([place])   . Hypomagnesemia   . Alcoholic cirrhosis of liver without ascites ([place])   . Thrombocytopenia ([place])   . Non-traumatic rhabdomyolysis   . Alcohol withdrawal delirium ([place]) 12/08/2019  . Alcohol abuse   . Alcohol intoxication, uncomplicated ([place])   . 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 Hypokalemia 09/27/2019  . Dysphagia 09/27/2019  . Alcohol withdrawal delirium, acute, hyperactive ([place]) 09/21/2019  . Pressure injury of skin 08/15/2019  . Goals of care, counseling/discussion   . Palliative care by specialist   . Alcohol withdrawal ([place]) 03/20/2019  . Low back pain 11/15/2018  . Acute metabolic encephalopathy 67/67/2094  . Delirium tremens ([place]) 03/08/2018  . Malnutrition of moderate degree 11/24/2017  . HTN (hypertension)  09/16/2017  . Hypothermia 12/17/2016  . Alcohol use   . Diarrhea   . Hyponatremia 07/09/2015    Orientation RESPIRATION BLADDER Height & Weight     Self, Time, Situation, Place  Normal Continent Weight: 54 kg Height:  5' 3"  (160 cm)  BEHAVIORAL SYMPTOMS/MOOD NEUROLOGICAL BOWEL NUTRITION STATUS      Continent Diet(Regular)  AMBULATORY STATUS COMMUNICATION OF NEEDS Skin   Limited Assist Verbally Normal                       Personal Care Assistance Level of Assistance  Bathing, Dressing Bathing Assistance: Limited assistance   Dressing Assistance: Limited assistance     Functional Limitations Info             SPECIAL CARE FACTORS FREQUENCY  PT (By licensed PT), OT (By licensed OT)     PT Frequency: 5x a week OT Frequency: 5x a week            Contractures Contractures Info: Not present    Additional Factors Info  Code Status Code Status Info: Full Code             Current Medications (03/19/2020):  This is the current hospital active medication list Current Facility-Administered Medications  Medication Dose Route Frequency Provider Last Rate Last Admin  . 0.9 %  sodium chloride infusion   Intravenous PRN Edwin Dada, MD 10 mL/hr at 03/18/20 2158 250 mL at 03/18/20 2158  . acetaminophen (TYLENOL) tablet 650 mg  650 mg Oral Q6H PRN Danford, Suann Larry, MD   650  mg at 03/18/20 0054   Or  . acetaminophen (TYLENOL) suppository 650 mg  650 mg Rectal Q6H PRN Danford, Suann Larry, MD      . atenolol (TENORMIN) tablet 50 mg  50 mg Oral Daily Danford, Suann Larry, MD   50 mg at 03/18/20 1525  . baclofen (LIORESAL) tablet 10 mg  10 mg Oral TID Edwin Dada, MD   10 mg at 03/18/20 2016  . cefTRIAXone (ROCEPHIN) 2 g in sodium chloride 0.9 % 100 mL IVPB  2 g Intravenous Q24H Edwin Dada, MD 200 mL/hr at 03/18/20 2159 2 g at 03/18/20 2159  . enoxaparin (LOVENOX) injection 40 mg  40 mg Subcutaneous Q24H Danford, Suann Larry, MD    40 mg at 37/85/88 5027  . folic acid (FOLVITE) tablet 1 mg  1 mg Oral Daily Danford, Suann Larry, MD   1 mg at 03/18/20 1016  . hydrALAZINE (APRESOLINE) tablet 25 mg  25 mg Oral Q8H PRN Danford, Suann Larry, MD      . hydrocortisone-pramoxine (PROCTOFOAM-HC) rectal foam 1 applicator  1 applicator Rectal BID PRN Edwin Dada, MD   1 applicator at 74/12/87 8676  . HYDROmorphone (DILAUDID) injection 0.3 mg  0.3 mg Intravenous Q4H PRN Danford, Suann Larry, MD      . labetalol (NORMODYNE) injection 10 mg  10 mg Intravenous Q6H PRN Lang Snow, NP   10 mg at 03/18/20 0055  . LORazepam (ATIVAN) tablet 1-4 mg  1-4 mg Oral Q1H PRN Edwin Dada, MD   1 mg at 03/18/20 2026   Or  . LORazepam (ATIVAN) injection 1-4 mg  1-4 mg Intravenous Q1H PRN Danford, Suann Larry, MD      . metroNIDAZOLE (FLAGYL) IVPB 500 mg  500 mg Intravenous Q8H Danford, Suann Larry, MD 100 mL/hr at 03/19/20 0202 500 mg at 03/19/20 0202  . multivitamin with minerals tablet 1 tablet  1 tablet Oral Daily Danford, Suann Larry, MD   1 tablet at 03/18/20 1016  . ondansetron (ZOFRAN) tablet 4 mg  4 mg Oral Q6H PRN Edwin Dada, MD   4 mg at 03/18/20 0055   Or  . ondansetron (ZOFRAN) injection 4 mg  4 mg Intravenous Q6H PRN Danford, Suann Larry, MD      . pantoprazole (PROTONIX) EC tablet 40 mg  40 mg Oral Daily Nazari, Walid A, RPH   40 mg at 03/18/20 1016  . sucralfate (CARAFATE) tablet 1 g  1 g Oral QID PRN Edwin Dada, MD   1 g at 03/17/20 1956  . thiamine tablet 100 mg  100 mg Oral Daily Danford, Suann Larry, MD   100 mg at 03/18/20 1016   Or  . thiamine (B-1) injection 100 mg  100 mg Intravenous Daily Danford, Suann Larry, MD         Discharge Medications: Please see discharge summary for a list of discharge medications.  Relevant Imaging Results:  Relevant Lab Results:   Additional Information SS# [id number]  Victorino Dike, RN

## 2020-03-19 NOTE — Progress Notes (Signed)
Initial Nutrition Assessment  DOCUMENTATION CODES:   Severe malnutrition in context of social or environmental circumstances  INTERVENTION:  Provide Ensure Enlive po BID, each supplement provides 350 kcal and 20 grams of protein. Patient prefers vanilla or chocolate.  Encouraged adequate intake of calories and protein at meals as patient is now able to eat 100% of his meals. Discussed foods that are high in calories and protein.  NUTRITION DIAGNOSIS:   Severe Malnutrition related to social / environmental circumstances(EtOH use, inadequate oral intake) as evidenced by moderate fat depletion, severe fat depletion, moderate muscle depletion, severe muscle depletion.  GOAL:   Patient will meet greater than or equal to 90% of their needs  MONITOR:   PO intake, Supplement acceptance, Labs, Weight trends, I & O's, Skin  REASON FOR ASSESSMENT:   Malnutrition Screening Tool    ASSESSMENT:   69 year old male with PMHx of HTN, EtOH use, TBI, A-fib admitted with possible sepsis due to proctitis, severe constipation.   Met with patient at bedside. He reports his appetite is improving and he was eating better at home PTA. He reports eating two meals per day at home now. For breakfast he has eggs and frozen pancakes that he heats up. For dinner he has Meals on Wheels that he heats up in the microwave. He has drank Ensure at home in the past but does not drink them regularly now. He is eating well at the hospital. He ate 100% of breakfast and lunch today. [name] amenable to drinking Ensure again to help meet calorie/protein needs. He reports he had constipation PTA but that it has resolved now.  Patient's UBW was 145 lbs at his heaviest. Recently it had been 120 lbs but it fluctuates. [name] currently 54 kg (119 lbs). Of note patient's NFPE is beginning to improve and areas that were severe depletion last month are now only moderate depletion.   Medications reviewed and include: folic  acid 1 mg daily, MVI, Protonix, thiamine 100 mg daily, ceftriaxone, Flagyl.  Labs reviewed: BUN <5, Creatinine <0.3.  Discussed that patient is at risk for refeeding syndrome with MD.  NUTRITION - FOCUSED PHYSICAL EXAM:    Most Recent Value  Orbital Region  Severe depletion  Upper Arm Region  Moderate depletion  Thoracic and Lumbar Region  Moderate depletion  Buccal Region  Severe depletion  Temple Region  Severe depletion  Clavicle Bone Region  Moderate depletion  Clavicle and Acromion Bone Region  Moderate depletion  Scapular Bone Region  Moderate depletion  Dorsal Hand  Moderate depletion  Patellar Region  Severe depletion  Anterior Thigh Region  Severe depletion  Posterior Calf Region  Severe depletion  Edema (RD Assessment)  None  Hair  Reviewed  Eyes  Reviewed  Mouth  Reviewed  Skin  Reviewed  Nails  Reviewed     Diet Order:   Diet Order            Diet regular Room service appropriate? Yes; Fluid consistency: Thin  Diet effective now             EDUCATION NEEDS:   Education needs have been addressed  Skin:  Skin Assessment: Skin Integrity Issues:(MSAD to buttocks and groin)  Last BM:  03/19/2020 - medium type 6  Height:   Ht Readings from Last 1 Encounters:  03/17/20 5' 3"  (1.6 m)   Weight:   Wt Readings from Last 1 Encounters:  03/19/20 54 kg   BMI:  Body mass index  is 21.08 kg/m.  Estimated Nutritional Needs:   Kcal:  1500-1700  Protein:  75-85 grams  Fluid:  1.5-1.7 L/day  Jacklynn Barnacle, MS, RD, LDN Pager number available on Amion

## 2020-03-19 NOTE — Progress Notes (Signed)
PROGRESS NOTE    Douglas Peterson.  UTM:[id number] DOB: 1950-12-09 DOA: 03/17/2020 PCP: [name], MD      Brief Narrative:  Douglas Peterson is a 69 y.o. M with hx of alcohol use disorder, HTN, TBI with right-sided hemiparesis, recent admission for intoxicating complicated by refeeding syndrome and atrial fibrillation who presents with a few days progressive lower abdominal pain, urge to defecate and constipation, now severe malaise.  Patient for started to notice constipation and pain in his rectum a few days ago, as if he had the urge to poop but could not.  This progressively got worse and he started to feel generalized malaise so he called 911 today.  EMS found him in bed, covered in urine, surrounded by beer cans and brought him to the ER.  In the ER afebrile, heart rate 105, respirations 24, blood pressure normal, pulse ox normal.  WBC 19 K, sodium 128, anion gap elevated, lactate 4.7.  Alcohol level is 266.  CT head and C-spine unremarkable, CT abdomen pelvis showed large volume of impacted stool in the colon, proctitis.  Chest x-ray clear, urinalysis without cells.  He was given 30 cc per cake IV fluids, and Zosyn was given empirically.         Assessment & Plan:  Probably sepsis due to proctitis Cultures negative. -Continue ceftriaxone and Flagyl, day 3      Bowel impaction Resolved  Alcoholism CIWA scores improving, I think this is resolving but he still has withdrawal symptoms -Continue CIWA -Continue on command lorazepam -Continue thiamine and folate  Paroxysmal atrial fibrillation Not on anticoagulation due to history of thrombocytopenia, ongoing alcohol use, falls.  Hypertension Blood pressure improved -Continue atenolol -Continue as needed hydralazine   Hyponatremia Resolved  History of traumatic brain injury -Continue home baclofen  GERD -Continue home pantoprazole   Severe protein calorie malnutrition As evidenced by chronic illness,  BMI 20, and severe loss of subcutaneous muscle mass and fat.         Disposition: Status is: INPATIENT  The patient appears to be still with active alcohol withdrawal and as a result is unable to walk safely independently (though he lives independently)   Dispo: The patient is from: Home              Anticipated d/c is to: SNF              Anticipated d/c date is: 2 days              Patient currently is not medically stable to d/c.              MDM: The below labs and imaging reports were reviewed and summarized above.  Medication management as above.     DVT prophylaxis: Lovenox Code Status: Full code Family Communication: daughter and sister by phone    Consultants:     Procedures:     Antimicrobials:   Ceftriaxone/Flagyl 5/15>>  Culture data:   5/15 blood cultures x2-gram positive rods in 1/2, likely contaminant, speciation pending          Subjective: Rectal pein better.  Having loose stools now.  No fever, no confusion.  Still tremulous.       Objective: Vitals:   03/19/20 0500 03/19/20 0726 03/19/20 1116 03/19/20 1609  BP: 139/70 (!) 148/88 (!) 145/79 139/83  Pulse: 64 61 79 70  Resp:  18 18 17   Temp:  (!) 97.5 F (36.4 C) 97.6 F (36.4 C) 97.7 F (  36.5 C)  TempSrc:  Oral Oral Oral  SpO2:  98% 99% 98%  Weight:      Height:        Intake/Output Summary (Last 24 hours) at 03/19/2020 1724 Last data filed at 03/19/2020 1345 Gross per 24 hour  Intake 1085.12 ml  Output 500 ml  Net 585.12 ml   Filed Weights   03/17/20 1335 03/18/20 0326 03/19/20 0402  Weight: 50.8 kg 53.6 kg 54 kg    Examination: General appearance: Thin chronically ill-appearing adult male, alert and in moderate distress from constipation and alcohol withdrawal.   HEENT: Anicteric, conjunctiva inflamed, lids and lashes normal. No nasal deformity, discharge, epistaxis.  Lips moist, partially edentulous, oropharynx moist, no oral lesions, hearing  diminished.   Skin:, Warm, moist, no jaundice.  No suspicious rashes or lesions. Cardiac: Tachycardic, regular, nl S1-S2, no murmurs appreciated.  Capillary refill is brisk.  JVP not visible.  No LE edema.  Radial pulses 2+ and symmetric. Respiratory: Normal respiratory rate and rhythm.  CTAB without rales or wheezes. Abdomen: Abdomen soft.  Moderate nonfocal TTP positive guarding. No ascites, distension, hepatosplenomegaly.   MSK: No deformities or effusions.  Diffuse severe loss of subcutaneous muscle mass and fat. Neuro: Awake and alert.  EOMI, moves all extremities with generalized weakness. Speech fluent.   Mild tremor. Psych: Sensorium intact and responding to questions, attention normal. Affect blunted.  Judgment and insight appear mildly impaired.    Data Reviewed: I have personally reviewed following labs and imaging studies:  CBC: Recent Labs  Lab 03/17/20 1430 03/18/20 0320 03/19/20 0531  WBC 19.5* 9.3 4.6  NEUTROABS 8.8*  --   --   HGB 16.9 12.8* 12.3*  HCT 49.4 36.1* 37.2*  MCV 90.6 90.3 94.2  PLT 375 211 201*   Basic Metabolic Panel: Recent Labs  Lab 03/17/20 1430 03/17/20 1626 03/18/20 0320 03/19/20 0531  NA 128*  --  136 140  K 4.1  --  3.6 3.5  CL 86*  --  101 105  CO2 26  --  27 27  GLUCOSE 113*  --  107* 98  BUN 6*  --  6* <5*  CREATININE 0.53*  --  0.47* <0.30*  CALCIUM 8.9  --  8.0* 8.4*  MG 2.0  --   --  1.8  PHOS  --  3.2  --  2.5   GFR: CrCl cannot be calculated (This lab value cannot be used to calculate CrCl because it is not a number: <0.30). Liver Function Tests: Recent Labs  Lab 03/17/20 1430 03/19/20 0531  AST 36 23  ALT 25 20  ALKPHOS 96 73  BILITOT 1.1 0.8  PROT 8.4* 6.4*  ALBUMIN 4.6 3.5   Recent Labs  Lab 03/17/20 1430  LIPASE 22   Recent Labs  Lab 03/17/20 1430  AMMONIA 14   Coagulation Profile: No results for input(s): INR, PROTIME in the last 168 hours. Cardiac Enzymes: Recent Labs  Lab 03/17/20 1430   CKTOTAL 88   BNP (last 3 results) No results for input(s): PROBNP in the last 8760 hours. HbA1C: No results for input(s): HGBA1C in the last 72 hours. CBG: No results for input(s): GLUCAP in the last 168 hours. Lipid Profile: No results for input(s): CHOL, HDL, LDLCALC, TRIG, CHOLHDL, LDLDIRECT in the last 72 hours. Thyroid Function Tests: No results for input(s): TSH, T4TOTAL, FREET4, T3FREE, THYROIDAB in the last 72 hours. Anemia Panel: No results for input(s): VITAMINB12, FOLATE, FERRITIN, TIBC, IRON, RETICCTPCT in the  last 72 hours. Urine analysis:    Component Value Date/Time   COLORURINE YELLOW (A) 03/17/2020 1346   APPEARANCEUR HAZY (A) 03/17/2020 1346   LABSPEC 1.029 03/17/2020 1346   PHURINE 5.0 03/17/2020 1346   GLUCOSEU NEGATIVE 03/17/2020 1346   HGBUR NEGATIVE 03/17/2020 1346   BILIRUBINUR NEGATIVE 03/17/2020 1346   KETONESUR 5 (A) 03/17/2020 1346   PROTEINUR 100 (A) 03/17/2020 1346   NITRITE NEGATIVE 03/17/2020 1346   LEUKOCYTESUR NEGATIVE 03/17/2020 1346   Sepsis Labs: @LABRCNTIP (procalcitonin:4,lacticacidven:4)  ) Recent Results (from the past 240 hour(s))  SARS Coronavirus 2 by RT PCR (hospital order, performed in [facility] hospital lab) Nasopharyngeal Nasopharyngeal Swab     Status: None   Collection Time: 03/17/20  2:30 PM   Specimen: Nasopharyngeal Swab  Result Value Ref Range Status   SARS Coronavirus 2 NEGATIVE NEGATIVE Final    Comment: (NOTE) SARS-CoV-2 target nucleic acids are NOT DETECTED. The SARS-CoV-2 RNA is generally detectable in upper and lower respiratory specimens during the acute phase of infection. The lowest concentration of SARS-CoV-2 viral copies this assay can detect is 250 copies / mL. A negative result does not preclude SARS-CoV-2 infection and should not be used as the sole basis for treatment or other patient management decisions.  A negative result may occur with improper specimen collection / handling, submission of  specimen other than nasopharyngeal swab, presence of viral mutation(s) within the areas targeted by this assay, and inadequate number of viral copies (<250 copies / mL). A negative result must be combined with clinical observations, patient history, and epidemiological information. Fact Sheet for Patients:   StrictlyIdeas.no Fact Sheet for Healthcare Providers: BankingDealers.co.za This test is not yet approved or cleared  by the [place] FDA and has been authorized for detection and/or diagnosis of SARS-CoV-2 by FDA under an Emergency Use Authorization (EUA).  This EUA will remain in effect (meaning this test can be used) for the duration of the COVID-19 declaration under Section 564(b)(1) of the Act, 21 U.S.C. section 360bbb-3(b)(1), unless the authorization is terminated or revoked sooner. Performed at [facility], [address] [place], [place] [address]   CULTURE, BLOOD (ROUTINE X 2) w Reflex to ID Panel     Status: None (Preliminary result)   Collection Time: 03/17/20  2:30 PM   Specimen: BLOOD  Result Value Ref Range Status   Specimen Description BLOOD BLOOD LEFT FOREARM  Final   Special Requests   Final    BOTTLES DRAWN AEROBIC AND ANAEROBIC Blood Culture adequate volume   Culture  Setup Time   Final    GRAM POSITIVE RODS AEROBIC BOTTLE ONLY CRITICAL RESULT CALLED TO, READ BACK BY AND VERIFIED WITH: [place] AT 0508 ON 03/19/20 RWW Performed at [facility], [address], [place], [place] [address]    Culture GRAM POSITIVE RODS  Final   Report Status PENDING  Incomplete  CULTURE, BLOOD (ROUTINE X 2) w Reflex to ID Panel     Status: None (Preliminary result)   Collection Time: 03/18/20  3:22 AM   Specimen: BLOOD  Result Value Ref Range Status   Specimen Description BLOOD BLOOD LEFT WRIST  Final   Special Requests   Final    BOTTLES DRAWN AEROBIC AND ANAEROBIC Blood Culture results may not be  optimal due to an excessive volume of blood received in culture bottles   Culture   Final    NO GROWTH 1 DAY Performed at [facility], [address] [place],  [place] [address]    Report Status PENDING  Incomplete         Radiology Studies: DG Abd 1 View  Result Date: 03/18/2020 CLINICAL DATA:  Constipation. Patient reports too painful bowel movements today. EXAM: ABDOMEN - 1 VIEW COMPARISON:  03/17/2020 FINDINGS: Gaseous distension of the small and large bowel loops again noted. When compared with the previous exam there is been decrease caliber of the descending colon. Persistent but improved stool burden noted within the rectum. No free intraperitoneal air. IMPRESSION: 1. Persistent but improved stool burden within the rectum. 2. Improvement in colonic distention. Electronically Signed   By: Kerby Moors M.D.   On: 03/18/2020 11:23        Scheduled Meds: . atenolol  50 mg Oral Daily  . baclofen  10 mg Oral TID  . enoxaparin (LOVENOX) injection  40 mg Subcutaneous Q24H  . folic acid  1 mg Oral Daily  . multivitamin with minerals  1 tablet Oral Daily  . naltrexone  50 mg Oral Daily  . pantoprazole  40 mg Oral Daily  . thiamine  100 mg Oral Daily   Or  . thiamine  100 mg Intravenous Daily   Continuous Infusions: . sodium chloride 250 mL (03/18/20 2158)  . cefTRIAXone (ROCEPHIN)  IV 2 g (03/19/20 1624)  . metronidazole 500 mg (03/19/20 1003)     LOS: 1 day    Time spent: 25 minutes    Edwin Dada, MD [facility] 03/19/2020, 5:24 PM     Please page though [place] or Epic secure chat:  For [employer], Adult nurse

## 2020-03-20 ENCOUNTER — Ambulatory Visit: Payer: Medicare HMO | Admitting: Gastroenterology

## 2020-03-20 DIAGNOSIS — R001 Bradycardia, unspecified: Secondary | ICD-10-CM

## 2020-03-20 LAB — CBC
HCT: 38 % — ABNORMAL LOW (ref 39.0–52.0)
Hemoglobin: 13.1 g/dL (ref 13.0–17.0)
MCH: 31.9 pg (ref 26.0–34.0)
MCHC: 34.5 g/dL (ref 30.0–36.0)
MCV: 92.5 fL (ref 80.0–100.0)
Platelets: 143 10*3/uL — ABNORMAL LOW (ref 150–400)
RBC: 4.11 MIL/uL — ABNORMAL LOW (ref 4.22–5.81)
RDW: 12.7 % (ref 11.5–15.5)
WBC: 4.6 10*3/uL (ref 4.0–10.5)
nRBC: 0 % (ref 0.0–0.2)

## 2020-03-20 LAB — COMPREHENSIVE METABOLIC PANEL
ALT: 21 U/L (ref 0–44)
AST: 29 U/L (ref 15–41)
Albumin: 3.3 g/dL — ABNORMAL LOW (ref 3.5–5.0)
Alkaline Phosphatase: 83 U/L (ref 38–126)
Anion gap: 5 (ref 5–15)
BUN: 7 mg/dL — ABNORMAL LOW (ref 8–23)
CO2: 28 mmol/L (ref 22–32)
Calcium: 8.8 mg/dL — ABNORMAL LOW (ref 8.9–10.3)
Chloride: 107 mmol/L (ref 98–111)
Creatinine, Ser: 0.48 mg/dL — ABNORMAL LOW (ref 0.61–1.24)
GFR calc Af Amer: >60 mL/min (ref >60)
GFR calc non Af Amer: >60 mL/min (ref >60)
Glucose, Bld: 120 mg/dL — ABNORMAL HIGH (ref 70–99)
Potassium: 3.4 mmol/L — ABNORMAL LOW (ref 3.5–5.1)
Sodium: 140 mmol/L (ref 135–145)
Total Bilirubin: 0.6 mg/dL (ref 0.3–1.2)
Total Protein: 6.1 g/dL — ABNORMAL LOW (ref 6.5–8.1)

## 2020-03-20 LAB — SARS CORONAVIRUS 2 (TAT 6-24 HRS): SARS Coronavirus 2: NEGATIVE

## 2020-03-20 LAB — PHOSPHORUS: Phosphorus: 3.7 mg/dL (ref 2.5–4.6)

## 2020-03-20 LAB — MAGNESIUM: Magnesium: 1.7 mg/dL (ref 1.7–2.4)

## 2020-03-20 MED ORDER — POLYETHYLENE GLYCOL 3350 17 G PO PACK: 17.0000 g | PACK | Freq: Every day | ORAL | Status: DC

## 2020-03-20 MED ORDER — POTASSIUM CHLORIDE CRYS ER 20 MEQ PO TBCR
40.0000 meq | EXTENDED_RELEASE_TABLET | Freq: Once | ORAL | Status: AC
  Administered 2020-03-20: 40 meq via ORAL
  Filled 2020-03-20: qty 2

## 2020-03-20 MED ORDER — AMLODIPINE BESYLATE 5 MG PO TABS
5.0000 mg | ORAL_TABLET | Freq: Every day | ORAL | Status: DC
  Administered 2020-03-20 – 2020-03-23 (×4): 5 mg via ORAL
  Filled 2020-03-20 (×4): qty 1

## 2020-03-20 NOTE — Progress Notes (Signed)
PROGRESS NOTE    Douglas Peterson.  XOV:[id number] DOB: 19-Jun-1951 DOA: 03/17/2020 PCP: Douglas Kayser, MD      Brief Narrative:  Douglas Peterson is a 69 y.o. M with hx of alcohol use disorder, HTN, TBI with right-sided hemiparesis, recent admission for intoxicating complicated by refeeding syndrome and atrial fibrillation who presents with a few days progressive lower abdominal pain, urge to defecate and constipation, now severe malaise.  Patient for started to notice constipation and pain in his rectum a few days ago, as if he had the urge to poop but could not.  This progressively got worse and he started to feel generalized malaise so he called 911 today.  EMS found him in bed, covered in urine, surrounded by beer cans and brought him to the ER.  In the ER afebrile, heart rate 105, respirations 24, blood pressure normal, pulse ox normal.  WBC 19 K, sodium 128, anion gap elevated, lactate 4.7.  Alcohol level is 266.  CT head and C-spine unremarkable, CT abdomen pelvis showed large volume of impacted stool in the colon, proctitis.  Chest x-ray clear, urinalysis without cells.  He was given 30 cc per cake IV fluids, and Zosyn was given empirically.         Assessment & Plan:  Probably sepsis due to proctitis Patient presented with tachycardia, leukocytosis, tachypnea, and lactate 5.  CT showed proctitis doubt abscess or fluid collection or cellulitis, and the patient had severe rectal pain.  No urinary symptoms to suggest UTI, UA without pyuria.  Cultures negative.  Started on ceftriaxone and flagyl, and symptoms improved.   -Continue ceftriaxone and Flagyl, day 4      Bowel impaction Patient with large severe stool impaction.  Attempt at digital disimpaction by me in ER were unsuccessful.  Given Dulcolax suppository and 1/2 GoLytely prep and impaction resolved. -MiraLAX daily  Alcoholism Still mild tremor, but CIWA scores improved, getting better now.    -Started  naltrexone -Trend LFTs and repeat LFTs in 3 weeks -Continue thiamine and folate -Extensive counseling on alcohol cessation  Paroxysmal atrial fibrillation Not on anticoagulation due to history of thrombocytopenia, ongoing alcohol use, falls.  Hypertension Blood pressure slightly high -Start amlodipine 5 -Continue as needed hydralazine   Bradycardia ECG reviewed, shows sinus bradycardia.  In setting of new naltrexone, atenolol. -Stop atenolol   Hyponatremia Resolved  History of traumatic brain injury -Continue baclofen  GERD -Continue home pantoprazole   Severe protein calorie malnutrition As evidenced by chronic illness, BMI 20, and severe loss of subcutaneous muscle mass and fat.         Disposition: Status is: INPATIENT  Remains inpatient appropriate: due to patient lives independently at baseline, but is currently unable to sit up or walk, safe disposition to rehab is pending insurance authroization   Dispo: The patient is from: Home              Anticipated d/c is to: SNF              Anticipated d/c date is: 1 day              Patient currently is medically stable to d/c.     To SNF when authroization complete         MDM: The below labs and imaging reports reviewed and summarized above.  Medication management as above.      DVT prophylaxis: Lovenox Code Status: Full code Family Communication: daughter    Consultants:  Procedures:     Antimicrobials:   Ceftriaxone/Flagyl 5/15>>  Culture data:   5/15 blood cultures x2-gram positive rods in 1/2, likely contaminant, speciation pending          Subjective: Rectal pain resolved.  BM stable.  No fever, no confusion.  Tremor mild, still very ewak.       Objective: Vitals:   03/20/20 0420 03/20/20 0535 03/20/20 0748 03/20/20 1126  BP: (!) 155/89 (!) 149/81 (!) 158/77 (!) 158/90  Pulse: (!) 59 (!) 54 (!) 50 (!) 54  Resp: 18 16 16 18   Temp: (!) 97.5 F (36.4 C)  98.3 F (36.8 C) (!) 97.4 F (36.3 C) 98.4 F (36.9 C)  TempSrc: Oral Oral Oral   SpO2: 98% 96% 94% 97%  Weight:  53.2 kg    Height:        Intake/Output Summary (Last 24 hours) at 03/20/2020 1340 Last data filed at 03/20/2020 1100 Gross per 24 hour  Intake 960 ml  Output 850 ml  Net 110 ml   Filed Weights   03/18/20 0326 03/19/20 0402 03/20/20 0535  Weight: 53.6 kg 54 kg 53.2 kg    Examination: General appearance: Thin, chronically ill-appearing adult male.  Awake and in no acute distress.     HEENT: Anicteric, conjunctival inflamed, lids and lashes normal.  No nasal deformity, discharge, epistaxis.  Partially edentulous, oropharynx moist, no oral lesions, hearing normal. Skin: Warm, no suspicious rashes or lesions Cardiac: Heart rhythm regular, rate slow, no murmurs, JVP not visible, no lower extremity edema Respiratory: Normal respiratory rate and rhythm, lungs clear without rales or wheezes Abdomen: Abdomen soft without tenderness palpation or guarding. MSK: Diffuse loss of subcutaneous muscle mass and fat. Neuro: Contractures on the right side.  Awake and alert, extraocular movements intact, speech fluent.  Tremor noted. Psych: Sensorium intact and responding to questions, attention normal, affect blunted, judgment insight appear mildly impaired.     Data Reviewed: I have personally reviewed following labs and imaging studies:  CBC: Recent Labs  Lab 03/17/20 1430 03/18/20 0320 03/19/20 0531 03/20/20 0503  WBC 19.5* 9.3 4.6 4.6  NEUTROABS 8.8*  --   --   --   HGB 16.9 12.8* 12.3* 13.1  HCT 49.4 36.1* 37.2* 38.0*  MCV 90.6 90.3 94.2 92.5  PLT 375 211 141* 846*   Basic Metabolic Panel: Recent Labs  Lab 03/17/20 1430 03/17/20 1626 03/18/20 0320 03/19/20 0531 03/20/20 0503  NA 128*  --  136 140 140  K 4.1  --  3.6 3.5 3.4*  CL 86*  --  101 105 107  CO2 26  --  27 27 28   GLUCOSE 113*  --  107* 98 120*  BUN 6*  --  6* <5* 7*  CREATININE 0.53*  --  0.47*  <0.30* 0.48*  CALCIUM 8.9  --  8.0* 8.4* 8.8*  MG 2.0  --   --  1.8 1.7  PHOS  --  3.2  --  2.5 3.7   GFR: Estimated Creatinine Clearance: 66.5 mL/min (A) (by C-G formula based on SCr of 0.48 mg/dL (L)). Liver Function Tests: Recent Labs  Lab 03/17/20 1430 03/19/20 0531 03/20/20 0503  AST 36 23 29  ALT 25 20 21   ALKPHOS 96 73 83  BILITOT 1.1 0.8 0.6  PROT 8.4* 6.4* 6.1*  ALBUMIN 4.6 3.5 3.3*   Recent Labs  Lab 03/17/20 1430  LIPASE 22   Recent Labs  Lab 03/17/20 1430  AMMONIA 14   Coagulation  Profile: No results for input(s): INR, PROTIME in the last 168 hours. Cardiac Enzymes: Recent Labs  Lab 03/17/20 1430  CKTOTAL 88   BNP (last 3 results) No results for input(s): PROBNP in the last 8760 hours. HbA1C: No results for input(s): HGBA1C in the last 72 hours. CBG: No results for input(s): GLUCAP in the last 168 hours. Lipid Profile: No results for input(s): CHOL, HDL, LDLCALC, TRIG, CHOLHDL, LDLDIRECT in the last 72 hours. Thyroid Function Tests: No results for input(s): TSH, T4TOTAL, FREET4, T3FREE, THYROIDAB in the last 72 hours. Anemia Panel: No results for input(s): VITAMINB12, FOLATE, FERRITIN, TIBC, IRON, RETICCTPCT in the last 72 hours. Urine analysis:    Component Value Date/Time   COLORURINE YELLOW (A) 03/17/2020 1346   APPEARANCEUR HAZY (A) 03/17/2020 1346   LABSPEC 1.029 03/17/2020 1346   PHURINE 5.0 03/17/2020 1346   GLUCOSEU NEGATIVE 03/17/2020 1346   HGBUR NEGATIVE 03/17/2020 1346   BILIRUBINUR NEGATIVE 03/17/2020 1346   KETONESUR 5 (A) 03/17/2020 1346   PROTEINUR 100 (A) 03/17/2020 1346   NITRITE NEGATIVE 03/17/2020 1346   LEUKOCYTESUR NEGATIVE 03/17/2020 1346   Sepsis Labs: @LABRCNTIP (procalcitonin:4,lacticacidven:4)  ) Recent Results (from the past 240 hour(s))  SARS Coronavirus 2 by RT PCR (hospital order, performed in [facility] hospital lab) Nasopharyngeal Nasopharyngeal Swab     Status: None   Collection Time: 03/17/20  2:30  PM   Specimen: Nasopharyngeal Swab  Result Value Ref Range Status   SARS Coronavirus 2 NEGATIVE NEGATIVE Final    Comment: (NOTE) SARS-CoV-2 target nucleic acids are NOT DETECTED. The SARS-CoV-2 RNA is generally detectable in upper and lower respiratory specimens during the acute phase of infection. The lowest concentration of SARS-CoV-2 viral copies this assay can detect is 250 copies / mL. A negative result does not preclude SARS-CoV-2 infection and should not be used as the sole basis for treatment or other patient management decisions.  A negative result may occur with improper specimen collection / handling, submission of specimen other than nasopharyngeal swab, presence of viral mutation(s) within the areas targeted by this assay, and inadequate number of viral copies (<250 copies / mL). A negative result must be combined with clinical observations, patient history, and epidemiological information. Fact Sheet for Patients:   StrictlyIdeas.no Fact Sheet for Healthcare Providers: BankingDealers.co.za This test is not yet approved or cleared  by the [place] FDA and has been authorized for detection and/or diagnosis of SARS-CoV-2 by FDA under an Emergency Use Authorization (EUA).  This EUA will remain in effect (meaning this test can be used) for the duration of the COVID-19 declaration under Section 564(b)(1) of the Act, 21 U.S.C. section 360bbb-3(b)(1), unless the authorization is terminated or revoked sooner. Performed at [facility], [address] [place], [place] [address]   CULTURE, BLOOD (ROUTINE X 2) w Reflex to ID Panel     Status: None (Preliminary result)   Collection Time: 03/17/20  2:30 PM   Specimen: BLOOD  Result Value Ref Range Status   Specimen Description BLOOD BLOOD LEFT FOREARM  Final   Special Requests   Final    BOTTLES DRAWN AEROBIC AND ANAEROBIC Blood Culture adequate volume   Culture   Setup Time   Final    GRAM POSITIVE RODS AEROBIC BOTTLE ONLY CRITICAL RESULT CALLED TO, READ BACK BY AND VERIFIED WITH: [place] AT 0508 ON 03/19/20 RWW Performed at [facility], [address], [place], [place] [address]    Culture GRAM POSITIVE RODS  Final  Report Status PENDING  Incomplete  CULTURE, BLOOD (ROUTINE X 2) w Reflex to ID Panel     Status: None (Preliminary result)   Collection Time: 03/18/20  3:22 AM   Specimen: BLOOD  Result Value Ref Range Status   Specimen Description BLOOD BLOOD LEFT WRIST  Final   Special Requests   Final    BOTTLES DRAWN AEROBIC AND ANAEROBIC Blood Culture results may not be optimal due to an excessive volume of blood received in culture bottles   Culture   Final    NO GROWTH 2 DAYS Performed at [facility], [address], [place], [place] [address]    Report Status PENDING  Incomplete         Radiology Studies: No results found.      Scheduled Meds: . amLODipine  5 mg Oral Daily  . baclofen  10 mg Oral TID  . enoxaparin (LOVENOX) injection  40 mg Subcutaneous Q24H  . feeding supplement (ENSURE ENLIVE)  237 mL Oral BID BM  . folic acid  1 mg Oral Daily  . multivitamin with minerals  1 tablet Oral Daily  . naltrexone  50 mg Oral Daily  . pantoprazole  40 mg Oral Daily  . thiamine  100 mg Oral Daily   Or  . thiamine  100 mg Intravenous Daily   Continuous Infusions: . sodium chloride 250 mL (03/18/20 2158)  . cefTRIAXone (ROCEPHIN)  IV Stopped (03/19/20 1654)  . metronidazole 500 mg (03/20/20 0856)     LOS: 2 days    Time spent: 35 minutes    Edwin Dada, MD [facility] 03/20/2020, 1:40 PM     Please page though [place] or Epic secure chat:  For [employer], Adult nurse

## 2020-03-20 NOTE — Progress Notes (Signed)
Physical Therapy Treatment Patient Details Name: Douglas Peterson. MRN: [id number] DOB: Sep 22, 1951 Today's Date: 03/20/2020    History of Present Illness  69 y.o. M with hx of alcohol use disorder, HTN, TBI with right-sided hemiparesis, recent admission for intoxicating complicated by refeeding syndrome and atrial fibrillation who presents with a few days progressive lower abdominal pain, urge to defecate and constipation, now severe malaise.  Pt had not had a BM for ~1 week and is endorsing consistent and signifcant rectal pain.  Pt was d/c to rehab 4/29 after a fall at home, found by EMS 5/15 at home, in bed, covered in urine and surrounded by empty beer cans.    PT Comments    Pt pleasant and motivated to participate during the session.  Pt put forth very good effort during the session with no reports of adverse symptoms.  Pt required assistance for stability during both transfers and gait and is at a very high risk for falls.  Pt would not be safe to return to his prior living situation at this time and will benefit from PT services in a SNF setting upon discharge to safely address deficits listed in patient problem list for decreased caregiver assistance and eventual return to PLOF.     Follow Up Recommendations  SNF     Equipment Recommendations  None recommended by PT    Recommendations for Other Services       Precautions / Restrictions Precautions Precautions: Fall Restrictions Weight Bearing Restrictions: No    Mobility  Bed Mobility Overal bed mobility: Needs Assistance Bed Mobility: Supine to Sit;Sit to Supine     Supine to sit: Supervision Sit to supine: Supervision   General bed mobility comments: LUE used heavily on the bed rail with extra time and effort required  Transfers Overall transfer level: Needs assistance Equipment used: Straight cane Transfers: Sit to/from Stand Sit to Stand: Min assist;From elevated surface         General transfer comment:  Min A for stability but no physical assist required to come to standing  Ambulation/Gait Ambulation/Gait assistance: Min assist;Mod assist Gait Distance (Feet): 20 Feet Assistive device: Straight cane Gait Pattern/deviations: Step-to pattern Gait velocity: decreased   General Gait Details: Slow, step-to cadence with min to mod A for stability, most notably during 180 deg turns   Stairs             Wheelchair Mobility    Modified Rankin (Stroke Patients Only)       Balance Overall balance assessment: Needs assistance Sitting-balance support: Feet unsupported;No upper extremity supported Sitting balance-Leahy Scale: Good Sitting balance - Comments: Steady static sitting, reaching within BOS   Standing balance support: During functional activity;Single extremity supported Standing balance-Leahy Scale: Poor Standing balance comment: Min to mod A for stability in standing and during amb                            Cognition Arousal/Alertness: Awake/alert Behavior During Therapy: WFL for tasks assessed/performed Overall Cognitive Status: Within Functional Limits for tasks assessed                                        Exercises Total Joint Exercises Ankle Circles/Pumps: Strengthening;Both;10 reps;15 reps Quad Sets: Strengthening;Both;10 reps;15 reps Gluteal Sets: Strengthening;Both;10 reps;15 reps Towel Squeeze: Strengthening;Both;10 reps;15 reps Hip ABduction/ADduction: Strengthening;Both;10 reps;15 reps Straight Leg  Raises: Strengthening;Both;10 reps;15 reps Long Arc Quad: Strengthening;Both;10 reps Knee Flexion: Strengthening;Both;10 reps Other Exercises Other Exercises: R hand positioning with rolled wash cloth to address finger flex contracture Other Exercises: Bed mobility training with rolling left/right and sup to/from sit Other Exercises: HEP education with BLE APs, QS, GS, and LAQs x 10 each 5-6x/day    General Comments         Pertinent Vitals/Pain Pain Assessment: No/denies pain    Home Living                      Prior Function            PT Goals (current goals can now be found in the care plan section) Progress towards PT goals: Progressing toward goals    Frequency    Min 2X/week      PT Plan Current plan remains appropriate    Co-evaluation              AM-PAC PT "6 Clicks" Mobility   Outcome Measure  Help needed turning from your back to your side while in a flat bed without using bedrails?: A Little Help needed moving from lying on your back to sitting on the side of a flat bed without using bedrails?: A Little Help needed moving to and from a bed to a chair (including a wheelchair)?: A Little   Help needed to walk in hospital room?: A Lot Help needed climbing 3-5 steps with a railing? : A Lot 6 Click Score: 13    End of Session Equipment Utilized During Treatment: Gait belt Activity Tolerance: Patient tolerated treatment well Patient left: in chair;with call bell/phone within reach;with chair alarm set Nurse Communication: Mobility status PT Visit Diagnosis: Muscle weakness (generalized) (M62.81);Difficulty in walking, not elsewhere classified (R26.2);Unsteadiness on feet (R26.81);Repeated falls (R29.6)     Time: 7824-2353 PT Time Calculation (min) (ACUTE ONLY): 39 min  Charges:  $Gait Training: 8-22 mins $Therapeutic Exercise: 8-22 mins $Therapeutic Activity: 8-22 mins                     D. Scott  PT, DPT 03/20/20, 5:28 PM

## 2020-03-20 NOTE — TOC Progression Note (Signed)
Transition of Care (TOC) - Progression Note    Patient Details  Name: Knowledge Escandon. MRN: [id number] Date of Birth: 1951-06-06  Transition of Care [facility]) CM/SW [place], RN Phone Number: 03/20/2020, 10:33 AM  Clinical Narrative:     Awaiting insurance auth and covid results, bed accepted at [facility].    Expected Discharge Plan: Skilled Nursing Facility Barriers to Discharge: Barriers Resolved  Expected Discharge Plan and Services Expected Discharge Plan: [place]   Discharge Planning Services: CM Consult   Living arrangements for the past 2 months: Single Family Home                   DME Agency: NA                   Social Determinants of Health (SDOH) Interventions    Readmission Risk Interventions Readmission Risk Prevention Plan 12/13/2019 12/12/2019  Transportation Screening Complete Complete  PCP or Specialist Appt within 3-5 Days (No Data) -  [place] or [place] (No Data) -  Palliative Care Screening Complete Complete  Medication Review (RN Care Manager) Complete Complete  Some recent data might be hidden

## 2020-03-21 LAB — MAGNESIUM: Magnesium: 1.5 mg/dL — ABNORMAL LOW (ref 1.7–2.4)

## 2020-03-21 LAB — CULTURE, BLOOD (ROUTINE X 2): Special Requests: ADEQUATE

## 2020-03-21 LAB — PHOSPHORUS: Phosphorus: 3.4 mg/dL (ref 2.5–4.6)

## 2020-03-21 MED ORDER — THIAMINE HCL 100 MG PO TABS: 100.0000 mg | ORAL_TABLET | Freq: Every day | ORAL | 0 refills | Status: DC

## 2020-03-21 MED ORDER — FOLIC ACID 1 MG PO TABS: 1.0000 mg | ORAL_TABLET | Freq: Every day | ORAL | 0 refills | Status: DC

## 2020-03-21 MED ORDER — POLYETHYLENE GLYCOL 3350 17 G PO PACK: 17.0000 g | PACK | Freq: Every day | ORAL | 0 refills | Status: DC

## 2020-03-21 MED ORDER — MAGNESIUM SULFATE 2 GM/50ML IV SOLN
2.0000 g | Freq: Once | INTRAVENOUS | Status: AC
  Administered 2020-03-21: 2 g via INTRAVENOUS
  Filled 2020-03-21: qty 50

## 2020-03-21 MED ORDER — CEFDINIR 300 MG PO CAPS
300.0000 mg | ORAL_CAPSULE | Freq: Two times a day (BID) | ORAL | 0 refills | Status: DC
Start: 2020-03-21 — End: 2020-03-26

## 2020-03-21 MED ORDER — ENSURE ENLIVE PO LIQD: 237.0000 mL | Freq: Two times a day (BID) | ORAL | 0 refills | Status: DC

## 2020-03-21 MED ORDER — DOCUSATE SODIUM 100 MG PO CAPS
100.0000 mg | ORAL_CAPSULE | Freq: Two times a day (BID) | ORAL | 0 refills | Status: DC
Start: 2020-03-21 — End: 2020-03-26

## 2020-03-21 MED ORDER — METRONIDAZOLE 500 MG PO TABS
500.0000 mg | ORAL_TABLET | Freq: Three times a day (TID) | ORAL | 0 refills | Status: DC
Start: 2020-03-21 — End: 2020-03-26

## 2020-03-21 MED ORDER — AMLODIPINE BESYLATE 5 MG PO TABS: 5.0000 mg | ORAL_TABLET | Freq: Every day | ORAL | 0 refills | Status: DC

## 2020-03-21 NOTE — Care Management Important Message (Signed)
Important Message  Patient Details  Name: Douglas Peterson. MRN: [id number] Date of Birth: 1951/07/08   Medicare Important Message Given:  Yes     [name] 03/21/2020, 11:06 AM

## 2020-03-21 NOTE — Discharge Summary (Addendum)
[facility] Discharge Summary   Patient: Douglas Peterson. YCX:[id number]  PCP: [name], MD  Date of admission: 03/17/2020   Date of discharge:  03/21/2020     Discharge Diagnoses:  Principal Problem:   Sepsis ([place]) Active Problems:   Hyponatremia   HTN (hypertension)   Alcohol intoxication, uncomplicated ([place])   Protein-calorie malnutrition, severe   Constipation   AF (paroxysmal atrial fibrillation) ([place])   Admitted From: Home Disposition:  SNF   Recommendations for Outpatient Follow-up:  1. PCP: Follow-up with PCP in 1 week 2. Reach out for substance abuse rehabilitation 3. Follow up LABS/TEST: None  Follow-up Information    [name], MD. Schedule an appointment as soon as possible for a visit in 1 week(s).   Specialty: Internal Medicine Contact information: [place] [address] [phone]          Diet recommendation: Cardiac diet  Activity: The patient is advised to gradually reintroduce usual activities, as tolerated  Discharge Condition: stable  Code Status: Full code   History of present illness: As per the H and P dictated on admission, "Douglas Peterson. is a 69 y.o. M with hx of alcohol use disorder, HTN, TBI with right-sided hemiparesis, recent admission for intoxicating complicated by refeeding syndrome and atrial fibrillation who presents with a few days progressive lower abdominal pain, urge to defecate and constipation, now severe malaise.  Patient for started to notice constipation and pain in his rectum a few days ago, as if he had the urge to poop but could not.  This progressively got worse and he started to feel generalized malaise so he called 911 today.  EMS found him in bed, covered in urine, surrounded by beer cans and brought him to the ER.  In the ER afebrile, heart rate 105, respirations 24, blood pressure normal, pulse ox normal.  WBC 19 K, sodium 128, anion gap elevated, lactate 4.7.  Alcohol level is  266.  CT head and C-spine unremarkable, CT abdomen pelvis showed large volume of impacted stool in the colon, proctitis.  Chest x-ray clear, urinalysis without cells.  He was given 30 cc per cake IV fluids, and Zosyn was given empirically."  Hospital Course:  Summary of his active problems in the hospital is as following. Sepsis due to proctitis. Severe dehydration. Presents with tachycardia leukocytosis tachypnea and elevated lactate. CT scan shows evidence of proctitis. No significant evidence of abscess or fluid collection. Patient with severe rectal pain. Cultures so far negative. Treated with IV ceftriaxone and IV Flagyl. Symptoms have so far improved. Currently patient will receive 3 more days of oral antibiotics.  Bowel impaction. Recurrent issue for the patient. Aggressive bowel regimen was initiated and currently it has resolved. Continue with MiraLAX and Colace on discharge.  Alcohol abuse disorder. CIWA score improving. Continue thiamine and folic acid. Counseling provided on alcohol cessation. Outpatient resources provided.  Paroxysmal A. fib. Not on anticoagulation due to history of thrombocytopenia, ongoing alcohol use as well as recurrent falls. Currently rate controlled in fact actually had bradycardic events during the hospital stay. Patient was on atenolol currently transition to Norvasc due to bradycardic events.  Essential hypertension. Bradycardia. Blood pressure high but heart rate in 50s. Atenolol discontinued. Currently on Norvasc 5 mg. At risk for constipation due to calcium channel blocker medication. Continue aggressive bowel regimen.  Hyponatremia. Likely from poor p.o. intake. Resolved.  History of traumatic brain injury. Chronic spasticity. Continue home medication.  GERD. Continue PPI.  Severe  protein calorie malnutrition. Body mass index is 21.29 kg/m.  Nutrition Problem: Severe Malnutrition Etiology: social / environmental  circumstances(EtOH use, inadequate oral intake) Nutrition Interventions: Interventions: Refer to RD note for recommendations Continue supplements.  Patient was seen by physical therapy, who recommended SNF, which was arranged. On the day of the discharge the patient's vitals were stable, and no other acute medical condition were reported by patient. the patient was felt safe to be discharge at SNF with SNF.  Consultants: NONE Procedures: NOEN  Discharge Exam: General: Appear in no distress, no Rash; Oral Mucosa Clear, moist. Cardiovascular: S1 and S2 Present, no Murmur, Respiratory: normal respiratory effort, Bilateral Air entry present and no Crackles, no wheezes Abdomen: Bowel Sound present, Soft and no tenderness, no hernia Extremities: no Pedal edema, no calf tenderness Neurology: alert and oriented to time, place, and person affect appropriate.  Chronic right-sided contracture  Filed Weights   03/19/20 0402 03/20/20 0535 03/21/20 0418  Weight: 54 kg 53.2 kg 54.5 kg   Vitals:   03/21/20 0418 03/21/20 0742  BP: 136/88 (!) 147/86  Pulse: 71 86  Resp: 20 20  Temp: 97.6 F (36.4 C) 98 F (36.7 C)  SpO2: 97% 97%    DISCHARGE MEDICATION: Allergies as of 03/21/2020   No Known Allergies     Medication List    STOP taking these medications   atenolol 25 MG tablet Commonly known as: TENORMIN   hydrOXYzine 25 MG tablet Commonly known as: ATARAX/VISTARIL     TAKE these medications   amLODipine 5 MG tablet Commonly known as: NORVASC Take 1 tablet (5 mg total) by mouth daily.   baclofen 10 MG tablet Commonly known as: LIORESAL Take 10 mg by mouth 3 (three) times daily.   cefdinir 300 MG capsule Commonly known as: OMNICEF Take 1 capsule (300 mg total) by mouth 2 (two) times daily for 3 days.   docusate sodium 100 MG capsule Commonly known as: Colace Take 1 capsule (100 mg total) by mouth 2 (two) times daily.   feeding supplement (ENSURE ENLIVE) Liqd Take 237  mLs by mouth 2 (two) times daily between meals.   folic acid 1 MG tablet Commonly known as: FOLVITE Take 1 tablet (1 mg total) by mouth daily.   metroNIDAZOLE 500 MG tablet Commonly known as: Flagyl Take 1 tablet (500 mg total) by mouth 3 (three) times daily for 3 days.   multivitamin with minerals Tabs tablet Take 1 tablet by mouth daily.   omeprazole 20 MG tablet Commonly known as: PriLOSEC OTC Take 1 tablet (20 mg total) by mouth daily.   polyethylene glycol 17 g packet Commonly known as: MIRALAX / GLYCOLAX Take 17 g by mouth daily.   sucralfate 1 g tablet Commonly known as: Carafate Take 1 tablet (1 g total) by mouth 4 (four) times daily as needed (for abdominal discomfort, nausea, and/or vomiting).   thiamine 100 MG tablet Take 1 tablet (100 mg total) by mouth daily. What changed: Another medication with the same name was added. Make sure you understand how and when to take each.   thiamine 100 MG tablet Take 1 tablet (100 mg total) by mouth daily. What changed: You were already taking a medication with the same name, and this prescription was added. Make sure you understand how and when to take each.      No Known Allergies Discharge Instructions    Diet - low sodium heart healthy   Complete by: As directed    Discharge instructions  Complete by: As directed    It is important that you read the given instructions as well as go over your medication list with RN to help you understand your care after this hospitalization.  Please follow-up with PCP in 1-2 weeks.  Please note that NO REFILLS for any discharge medications will be authorized once you are discharged, as it is imperative that you return to your primary care physician (or establish a relationship with a primary care physician if you do not have one) for your aftercare needs so that they can reassess your need for medications and monitor your lab values.  Please request your primary care physician to go  over all Hospital Tests and Procedure/Radiological results at the follow up. Please get all Hospital records sent to your PCP by signing hospital release before you go home.   Do not drive, operating heavy machinery, perform activities at heights, swimming or participation in water activities or provide baby sitting services; until you have been seen by Primary Care Physician or a Neurologist and are cleared to do such activities.  Do not take more than prescribed Pain, Sleep and Anxiety Medications.  You were cared for by a hospitalist during your hospital stay. If you have any questions about your discharge medications or the care you received while you were in the hospital after you are discharged, you can call the unit @UNIT @ you were admitted to and ask to speak with the hospitalist Berle Mull. Ask for Hospitalist on call if the hospitalist that took care of you is not available.   Once you are discharged, your primary care physician will handle any further medical issues.  You Must read complete instructions/literature along with all the possible adverse reactions/side effects for all the Medicines you take and that have been prescribed to you. Take any new Medicines after you have completely understood and accept all the possible adverse reactions/side effects.  If you have smoked or chewed Tobacco in the last 2 yrs please STOP smoking STOP any Recreational drug use.  If you drink alcohol, please safely STOP the use. Do not drive, operating heavy machinery, perform activities at heights, swimming or participation in water activities or provide baby sitting services under influence.  Wear Seat belts while driving.   Increase activity slowly   Complete by: As directed       The results of significant diagnostics from this hospitalization (including imaging, microbiology, ancillary and laboratory) are listed below for reference.    Significant Diagnostic Studies: DG Chest 1  View  Result Date: 03/17/2020 CLINICAL DATA:  Weakness for several days EXAM: CHEST  1 VIEW COMPARISON:  01/17/2020 FINDINGS: Chronically diminished lung volumes with bandlike areas of atelectasis in the lung bases. Central vascular crowding is noted. No consolidation, features of edema, pneumothorax, or effusion. The cardiomediastinal contours are unremarkable. Gaseous distention of the stomach is noted with layering ingested material. No other acute osseous or soft tissue abnormality. Telemetry leads overlie the chest. IMPRESSION: Low lung volumes with bandlike areas of atelectasis in the lung bases. Distended stomach, correlate for abdominal symptoms. Electronically Signed   By: Lovena Le M.D.   On: 03/17/2020 15:55   DG Shoulder Right  Result Date: 02/23/2020 CLINICAL DATA:  Right shoulder pain, fall EXAM: RIGHT SHOULDER - 2+ VIEW COMPARISON:  06/14/2019 FINDINGS: There is deformity of the proximal right humerus related to old healed fracture. Degenerative changes in the right AC and glenohumeral joints with joint space narrowing. No acute fracture, subluxation or  dislocation. IMPRESSION: Old healed proximal right humeral fracture with deformity. No acute fracture. Electronically Signed   By: Rolm Baptise M.D.   On: 02/23/2020 20:00   DG Abd 1 View  Result Date: 03/18/2020 CLINICAL DATA:  Constipation. Patient reports too painful bowel movements today. EXAM: ABDOMEN - 1 VIEW COMPARISON:  03/17/2020 FINDINGS: Gaseous distension of the small and large bowel loops again noted. When compared with the previous exam there is been decrease caliber of the descending colon. Persistent but improved stool burden noted within the rectum. No free intraperitoneal air. IMPRESSION: 1. Persistent but improved stool burden within the rectum. 2. Improvement in colonic distention. Electronically Signed   By: Kerby Moors M.D.   On: 03/18/2020 11:23   CT Head Wo Contrast  Result Date: 03/17/2020 CLINICAL DATA:   Headache. Suspected trauma. EXAM: CT HEAD WITHOUT CONTRAST CT CERVICAL SPINE WITHOUT CONTRAST TECHNIQUE: Multidetector CT imaging of the head and cervical spine was performed following the standard protocol without intravenous contrast. Multiplanar CT image reconstructions of the cervical spine were also generated. COMPARISON:  02/23/2020 FINDINGS: CT HEAD FINDINGS Brain: No evidence of acute infarction, hemorrhage, hydrocephalus, extra-axial collection or mass lesion/mass effect. There is prominence of the sulci and ventricles compatible with brain atrophy. Vascular: No hyperdense vessel or unexpected calcification. Skull: Normal. Negative for fracture or focal lesion. Sinuses/Orbits: No acute finding. Other: None. CT CERVICAL SPINE FINDINGS Alignment: Normal. Skull base and vertebrae: No acute fracture. No primary bone lesion or focal pathologic process. Soft tissues and spinal canal: No prevertebral fluid or swelling. No visible canal hematoma. Disc levels: There is fusion of the C5-6 disc space. Disc space narrowing and endplate spurring noted C4-5 and C6-7. Upper chest: Negative. Other: None IMPRESSION: 1. No acute intracranial abnormalities. 2. Chronic brain atrophy. 3. No evidence for cervical spine fracture. 4. Cervical degenerative disc disease. Electronically Signed   By: Kerby Moors M.D.   On: 03/17/2020 15:53   CT HEAD WO CONTRAST  Result Date: 02/23/2020 CLINICAL DATA:  Patient drank 8 beers today and fell this morning. EXAM: CT HEAD WITHOUT CONTRAST TECHNIQUE: Contiguous axial images were obtained from the base of the skull through the vertex without intravenous contrast. COMPARISON:  01/17/2020 FINDINGS: Brain: No evidence of acute infarction, hemorrhage, hydrocephalus, extra-axial collection or mass lesion/mass effect. There is ventricular sulcal enlargement reflecting atrophy, advanced for age. Minor periventricular white matter hypoattenuation is also noted consistent with chronic  microvascular ischemic change. Vascular: No hyperdense vessel or unexpected calcification. Skull: Normal. Negative for fracture or focal lesion. Sinuses/Orbits: Globes and orbits are unremarkable. Visualized sinuses are clear. Other: None. IMPRESSION: 1. No acute intracranial abnormalities. 2. Atrophy advanced for age. Minor chronic microvascular ischemic change. Electronically Signed   By: Lajean Manes M.D.   On: 02/23/2020 19:56   CT Cervical Spine Wo Contrast  Result Date: 03/17/2020 CLINICAL DATA:  Headache. Suspected trauma. EXAM: CT HEAD WITHOUT CONTRAST CT CERVICAL SPINE WITHOUT CONTRAST TECHNIQUE: Multidetector CT imaging of the head and cervical spine was performed following the standard protocol without intravenous contrast. Multiplanar CT image reconstructions of the cervical spine were also generated. COMPARISON:  02/23/2020 FINDINGS: CT HEAD FINDINGS Brain: No evidence of acute infarction, hemorrhage, hydrocephalus, extra-axial collection or mass lesion/mass effect. There is prominence of the sulci and ventricles compatible with brain atrophy. Vascular: No hyperdense vessel or unexpected calcification. Skull: Normal. Negative for fracture or focal lesion. Sinuses/Orbits: No acute finding. Other: None. CT CERVICAL SPINE FINDINGS Alignment: Normal. Skull base and vertebrae: No acute  fracture. No primary bone lesion or focal pathologic process. Soft tissues and spinal canal: No prevertebral fluid or swelling. No visible canal hematoma. Disc levels: There is fusion of the C5-6 disc space. Disc space narrowing and endplate spurring noted C4-5 and C6-7. Upper chest: Negative. Other: None IMPRESSION: 1. No acute intracranial abnormalities. 2. Chronic brain atrophy. 3. No evidence for cervical spine fracture. 4. Cervical degenerative disc disease. Electronically Signed   By: Kerby Moors M.D.   On: 03/17/2020 15:53   CT CERVICAL SPINE WO CONTRAST  Result Date: 02/23/2020 CLINICAL DATA:  Fall while  intoxicated EXAM: CT CERVICAL SPINE WITHOUT CONTRAST TECHNIQUE: Multidetector CT imaging of the cervical spine was performed without intravenous contrast. Multiplanar CT image reconstructions were also generated. COMPARISON:  CT 10/30/2019 FINDINGS: Alignment: Straightening of normal cervical lordosis with slight focal reversal at the C5-6 level. There is slight leftward rotation of the cranium with craniocervical atlantoaxial articulations limits for this degree of rotation. No abnormally widened, perched or jumped facets. Skull base and vertebrae: Fusion of the C5-6 vertebral bodies with chronic anterior wedging deformity of the C6 levels. Bony fusion across the left C4-5 articular facets right C5-6 articular facets. No acute fracture. No primary bone lesion or focal pathologic process. Soft tissues and spinal canal: No pre or paravertebral fluid or swelling. No visible canal hematoma. Disc levels: Multilevel intervertebral disc height loss with spondylitic endplate changes. Disc osteophyte complex at C5-6 and C6-C7 result in at most mild canal stenosis. Multilevel uncinate spurring and facet hypertrophic changes result in multilevel neural foraminal narrowing as well. Upper chest: No acute abnormality in the upper chest or imaged lung apices. Other: Normal thyroid. Cervical carotid atherosclerosis. For findings within the head, please see dedicated CT of the head performed concurrently. IMPRESSION: 1. No acute fracture or malalignment of the cervical spine. 2. Fusion of the C5-6 vertebral bodies with chronic anterior wedging deformity of the C6 levels. 3. Multilevel degenerative disc disease and facet hypertrophic changes, detailed above. 4. Cervical carotid atherosclerosis. Electronically Signed   By: Lovena Le M.D.   On: 02/23/2020 20:06   CT ABDOMEN PELVIS W CONTRAST  Result Date: 03/17/2020 CLINICAL DATA:  Patient originally was having abdominal pain, now complaining of back pain. EXAM: CT ABDOMEN AND  PELVIS WITH CONTRAST TECHNIQUE: Multidetector CT imaging of the abdomen and pelvis was performed using the standard protocol following bolus administration of intravenous contrast. CONTRAST:  55m OMNIPAQUE IOHEXOL 300 MG/ML  SOLN COMPARISON:  01/17/2020 FINDINGS: Lower chest: There is opacity at the bases of the right lower lobe and right middle lobe and minimally of the left lower lobe, consistent with atelectasis/scarring, similar to the prior CT. Heart normal in size. The distal esophagus is distended with evidence of wall thickening. Hepatobiliary: Liver normal in size. Decreased attenuation of the liver consistent with fatty infiltration. No mass or focal lesion. Normal gallbladder. No bile duct dilation. Pancreas: Unremarkable. No pancreatic ductal dilatation or surrounding inflammatory changes. Spleen: Normal in size without focal abnormality. Adrenals/Urinary Tract: No adrenal masses. Kidneys are normal in size, orientation and position with symmetric enhancement and excretion. No renal masses, stones or hydronephrosis. There is mild dilation of the ureters. No ureteral stones. Bladder is unremarkable. Stomach/Bowel: Rectum is significantly distended with stool, measuring 8.2 cm transversely. Rectal wall appears mildly thickened. Subtle adjacent fat inflammation. Distal sigmoid colon is distended with stool. Proximal sigmoid colon enters a left inguinal hernia without evidence of obstruction, incarceration or strangulation. Remainder of the colon is unremarkable. Stomach  is moderately distended with fluid and nondependent air. No stomach wall thickening or inflammation. Normal small bowel. No evidence of appendicitis. Vascular/Lymphatic: Mild aortic atherosclerotic calcifications. No aneurysm. Portal vein widely patent. Reproductive: Unremarkable. Other: No ascites. Musculoskeletal: Old fractures of L1 and L2, L2 previously treated with vertebroplasty. Old mild depression of the upper endplate T12. Mild  wedge-shaped deformity of T8. These findings are all stable from the prior CT. No acute fracture. No osteoblastic or osteolytic lesions. IMPRESSION: 1. Rectum and lower sigmoid colon are impacted with stool, rectum moderately distended. There is also mild wall thickening of the rectum with subtle adjacent inflammation consistent with proctitis. 2. Left inguinal hernia containing the proximal sigmoid colon, without evidence of obstruction, incarceration or strangulation. 3. Dilated distal esophagus with evidence wall thickening. Consider esophagitis in the proper clinical setting. 4. Hepatic steatosis. 5. Chronic vertebral compression fractures. Right greater than left lung base opacities consistent with atelectasis stable from the prior CT. Electronically Signed   By: Lajean Manes M.D.   On: 03/17/2020 15:51    Microbiology: Recent Results (from the past 240 hour(s))  SARS Coronavirus 2 by RT PCR (hospital order, performed in [facility] hospital lab) Nasopharyngeal Nasopharyngeal Swab     Status: None   Collection Time: 03/17/20  2:30 PM   Specimen: Nasopharyngeal Swab  Result Value Ref Range Status   SARS Coronavirus 2 NEGATIVE NEGATIVE Final    Comment: (NOTE) SARS-CoV-2 target nucleic acids are NOT DETECTED. The SARS-CoV-2 RNA is generally detectable in upper and lower respiratory specimens during the acute phase of infection. The lowest concentration of SARS-CoV-2 viral copies this assay can detect is 250 copies / mL. A negative result does not preclude SARS-CoV-2 infection and should not be used as the sole basis for treatment or other patient management decisions.  A negative result may occur with improper specimen collection / handling, submission of specimen other than nasopharyngeal swab, presence of viral mutation(s) within the areas targeted by this assay, and inadequate number of viral copies (<250 copies / mL). A negative result must be combined with clinical observations,  patient history, and epidemiological information. Fact Sheet for Patients:   StrictlyIdeas.no Fact Sheet for Healthcare Providers: BankingDealers.co.za This test is not yet approved or cleared  by the [place] FDA and has been authorized for detection and/or diagnosis of SARS-CoV-2 by FDA under an Emergency Use Authorization (EUA).  This EUA will remain in effect (meaning this test can be used) for the duration of the COVID-19 declaration under Section 564(b)(1) of the Act, 21 U.S.C. section 360bbb-3(b)(1), unless the authorization is terminated or revoked sooner. Performed at [facility], [address] [place], [place] [address]   CULTURE, BLOOD (ROUTINE X 2) w Reflex to ID Panel     Status: Abnormal   Collection Time: 03/17/20  2:30 PM   Specimen: BLOOD  Result Value Ref Range Status   Specimen Description   Final    BLOOD BLOOD LEFT FOREARM Performed at [facility], [address], [place], [place] [address]    Special Requests   Final    BOTTLES DRAWN AEROBIC AND ANAEROBIC Blood Culture adequate volume Performed at [facility], [address] [place], [place] [address]    Culture  Setup Time   Final    GRAM POSITIVE RODS AEROBIC BOTTLE ONLY CRITICAL RESULT CALLED TO, READ BACK BY AND VERIFIED WITH: Friona AT 7793 ON 03/19/20 RWW Performed at [facility], [address] [place],  [place] [address]    Culture (A)  Final    DIPHTHEROIDS(CORYNEBACTERIUM SPECIES) Standardized susceptibility testing for this organism is not available. Performed at [facility], [place] [address], [place], [place] [address]    Report Status 03/21/2020 FINAL  Final  CULTURE, BLOOD (ROUTINE X 2) w Reflex to ID Panel     Status: None (Preliminary result)   Collection Time: 03/18/20  3:22 AM   Specimen: BLOOD  Result Value Ref Range Status   Specimen Description BLOOD BLOOD LEFT WRIST  Final    Special Requests   Final    BOTTLES DRAWN AEROBIC AND ANAEROBIC Blood Culture results may not be optimal due to an excessive volume of blood received in culture bottles   Culture   Final    NO GROWTH 3 DAYS Performed at [facility], [address] [place], [place] [address]    Report Status PENDING  Incomplete  SARS CORONAVIRUS 2 (TAT 6-24 HRS) Nasopharyngeal Nasopharyngeal Swab     Status: None   Collection Time: 03/20/20 10:28 AM   Specimen: Nasopharyngeal Swab  Result Value Ref Range Status   SARS Coronavirus 2 NEGATIVE NEGATIVE Final    Comment: (NOTE) SARS-CoV-2 target nucleic acids are NOT DETECTED. The SARS-CoV-2 RNA is generally detectable in upper and lower respiratory specimens during the acute phase of infection. Negative results do not preclude SARS-CoV-2 infection, do not rule out co-infections with other pathogens, and should not be used as the sole basis for treatment or other patient management decisions. Negative results must be combined with clinical observations, patient history, and epidemiological information. The expected result is Negative. Fact Sheet for Patients: SugarRoll.be Fact Sheet for Healthcare Providers: https://www.woods-mathews.com/ This test is not yet approved or cleared by the [place] FDA and  has been authorized for detection and/or diagnosis of SARS-CoV-2 by FDA under an Emergency Use Authorization (EUA). This EUA will remain  in effect (meaning this test can be used) for the duration of the COVID-19 declaration under Section 56 4(b)(1) of the Act, 21 U.S.C. section 360bbb-3(b)(1), unless the authorization is terminated or revoked sooner. Performed at [facility], [place] [address] [place], [place] [address]      Labs: CBC: Recent Labs  Lab 03/17/20 1430 03/18/20 0320 03/19/20 0531 03/20/20 0503  WBC 19.5* 9.3 4.6 4.6  NEUTROABS 8.8*  --   --   --   HGB 16.9 12.8*  12.3* 13.1  HCT 49.4 36.1* 37.2* 38.0*  MCV 90.6 90.3 94.2 92.5  PLT 375 211 141* 223*   Basic Metabolic Panel: Recent Labs  Lab 03/17/20 1430 03/17/20 1626 03/18/20 0320 03/19/20 0531 03/20/20 0503 03/21/20 0546  NA 128*  --  136 140 140  --   K 4.1  --  3.6 3.5 3.4*  --   CL 86*  --  101 105 107  --   CO2 26  --  27 27 28   --   GLUCOSE 113*  --  107* 98 120*  --   BUN 6*  --  6* <5* 7*  --   CREATININE 0.53*  --  0.47* <0.30* 0.48*  --   CALCIUM 8.9  --  8.0* 8.4* 8.8*  --   MG 2.0  --   --  1.8 1.7 1.5*  PHOS  --  3.2  --  2.5 3.7 3.4   Liver Function Tests: Recent Labs  Lab 03/17/20 1430 03/19/20 0531 03/20/20 0503  AST 36 23 29  ALT  25 20 21   ALKPHOS 96 73 83  BILITOT 1.1 0.8 0.6  PROT 8.4* 6.4* 6.1*  ALBUMIN 4.6 3.5 3.3*   Recent Labs  Lab 03/17/20 1430  LIPASE 22   Recent Labs  Lab 03/17/20 1430  AMMONIA 14   Cardiac Enzymes: Recent Labs  Lab 03/17/20 1430  CKTOTAL 88   BNP (last 3 results) Recent Labs    01/18/20 1056  BNP 185.0*   CBG: No results for input(s): GLUCAP in the last 168 hours.  Time spent: 35 minutes  Signed:  Berle Mull  [facility]  03/21/2020 7:54 AM

## 2020-03-22 ENCOUNTER — Inpatient Hospital Stay: Payer: Medicare HMO

## 2020-03-22 LAB — PHOSPHORUS: Phosphorus: 3.9 mg/dL (ref 2.5–4.6)

## 2020-03-22 LAB — BASIC METABOLIC PANEL
Anion gap: 7 (ref 5–15)
BUN: 8 mg/dL (ref 8–23)
CO2: 27 mmol/L (ref 22–32)
Calcium: 8.6 mg/dL — ABNORMAL LOW (ref 8.9–10.3)
Chloride: 104 mmol/L (ref 98–111)
Creatinine, Ser: 0.49 mg/dL — ABNORMAL LOW (ref 0.61–1.24)
GFR calc Af Amer: >60 mL/min (ref >60)
GFR calc non Af Amer: >60 mL/min (ref >60)
Glucose, Bld: 109 mg/dL — ABNORMAL HIGH (ref 70–99)
Potassium: 3.4 mmol/L — ABNORMAL LOW (ref 3.5–5.1)
Sodium: 138 mmol/L (ref 135–145)

## 2020-03-22 LAB — MAGNESIUM: Magnesium: 1.9 mg/dL (ref 1.7–2.4)

## 2020-03-22 MED ORDER — METRONIDAZOLE 500 MG PO TABS
500.0000 mg | ORAL_TABLET | Freq: Three times a day (TID) | ORAL | Status: DC
  Administered 2020-03-22: 500 mg via ORAL
  Filled 2020-03-22 (×3): qty 1

## 2020-03-22 MED ORDER — LORAZEPAM 2 MG/ML IJ SOLN
0.5000 mg | Freq: Once | INTRAMUSCULAR | Status: AC
  Administered 2020-03-22: 0.5 mg via INTRAVENOUS
  Filled 2020-03-22: qty 1

## 2020-03-22 MED ORDER — HYDROCORTISONE (PERIANAL) 2.5 % EX CREA: TOPICAL_CREAM | Freq: Two times a day (BID) | CUTANEOUS | 0 refills | Status: DC

## 2020-03-22 MED ORDER — POLYETHYLENE GLYCOL 3350 17 G PO PACK
17.0000 g | PACK | Freq: Two times a day (BID) | ORAL | Status: DC
  Administered 2020-03-23: 17 g via ORAL
  Filled 2020-03-22 (×2): qty 1

## 2020-03-22 MED ORDER — METOCLOPRAMIDE HCL 5 MG/ML IJ SOLN
5.0000 mg | Freq: Four times a day (QID) | INTRAMUSCULAR | Status: DC
  Administered 2020-03-22 – 2020-03-24 (×7): 5 mg via INTRAVENOUS
  Filled 2020-03-22 (×7): qty 2

## 2020-03-22 MED ORDER — LACTULOSE 10 GM/15ML PO SOLN
20.0000 g | Freq: Three times a day (TID) | ORAL | Status: DC
  Administered 2020-03-22 – 2020-03-23 (×3): 20 g via ORAL
  Filled 2020-03-22 (×4): qty 30

## 2020-03-22 MED ORDER — LACTATED RINGERS IV SOLN: INTRAVENOUS | Status: AC

## 2020-03-22 MED ORDER — CEFDINIR 300 MG PO CAPS
300.0000 mg | ORAL_CAPSULE | Freq: Two times a day (BID) | ORAL | Status: DC
  Administered 2020-03-22: 300 mg via ORAL
  Filled 2020-03-22 (×2): qty 1

## 2020-03-22 MED ORDER — POTASSIUM CHLORIDE CRYS ER 20 MEQ PO TBCR
40.0000 meq | EXTENDED_RELEASE_TABLET | Freq: Once | ORAL | Status: AC
  Administered 2020-03-22: 40 meq via ORAL
  Filled 2020-03-22: qty 2

## 2020-03-22 MED ORDER — HYDROCORTISONE (PERIANAL) 2.5 % EX CREA
TOPICAL_CREAM | Freq: Two times a day (BID) | CUTANEOUS | Status: DC
  Filled 2020-03-22: qty 28.35

## 2020-03-22 MED ORDER — LORAZEPAM 0.5 MG PO TABS
0.5000 mg | ORAL_TABLET | ORAL | Status: DC | PRN
  Administered 2020-03-22 – 2020-03-23 (×2): 0.5 mg via ORAL
  Filled 2020-03-22 (×2): qty 1

## 2020-03-22 NOTE — Progress Notes (Signed)
PROGRESS NOTE    Douglas Peterson.  LTJ:[id number] DOB: 04-Jun-1951 DOA: 03/17/2020 PCP: [name], MD      Brief Narrative:  Douglas Peterson is a 69 y.o. M with hx of alcohol use disorder, HTN, TBI with right-sided hemiparesis, recent admission for intoxicating complicated by refeeding syndrome and atrial fibrillation who presents with a few days progressive lower abdominal pain, urge to defecate and constipation, now severe malaise.  Patient for started to notice constipation and pain in his rectum a few days ago, as if he had the urge to poop but could not.  This progressively got worse and he started to feel generalized malaise so he called 911 today.  EMS found him in bed, covered in urine, surrounded by beer cans and brought him to the ER.  In the ER afebrile, heart rate 105, respirations 24, blood pressure normal, pulse ox normal.  WBC 19 K, sodium 128, anion gap elevated, lactate 4.7.  Alcohol level is 266.  CT head and C-spine unremarkable, CT abdomen pelvis showed large volume of impacted stool in the colon, proctitis.  Chest x-ray clear, urinalysis without cells.  He was given 30 cc per cake IV fluids, and Zosyn was given empirically.  Assessment & Plan: Sepsis due to proctitis. Severe dehydration. Presents with tachycardia leukocytosis tachypnea and elevated lactate. CT scan shows evidence of proctitis. No significant evidence of abscess or fluid collection. Patient with severe rectal pain. Cultures so far negative. Treated with IV ceftriaxone and IV Flagyl. Symptoms have so far improved. Currently patient will receive 3 more days of oral antibiotics.  Bowel impaction. Intractable nausea and vomiting Recurrent issue for the patient. Aggressive bowel regimen was initiated. On 03/22/2020 patient reported 4 episodes of vomiting. X-ray abdomen shows evidence of constipation. Currently we will initiate scheduled Reglan along with aggressive bowel regimen. Repeat x-ray on  03/23/2020. Continue with MiraLAX and Colace on discharge.  Alcohol abuse disorder. CIWA score improving. Continue thiamine and folic acid. Counseling provided on alcohol cessation. Outpatient resources provided.  Paroxysmal A. fib. Not on anticoagulation due to history of thrombocytopenia, ongoing alcohol use as well as recurrent falls. Currently rate controlled in fact actually had bradycardic events during the hospital stay. Patient was on atenolol currently transition to Norvasc due to bradycardic events.  Essential hypertension. Bradycardia. Blood pressure high but heart rate in 50s. Atenolol discontinued. Currently on Norvasc 5 mg. At risk for constipation due to calcium channel blocker medication. Continue aggressive bowel regimen.  Hyponatremia. Likely from poor p.o. intake. Resolved. IV hydration resumed.  History of traumatic brain injury. Chronic spasticity. Continue home medication.  GERD. Continue PPI.  Severe protein calorie malnutrition. Body mass index is 21.29 kg/m.  Nutrition Problem: Severe Malnutrition Etiology: social / environmental circumstances(EtOH use, inadequate oral intake) Nutrition Interventions: Interventions: Refer to RD note for recommendations Continue supplements.  Disposition: Status is: INPATIENT  Remains inpatient appropriate: Patient with intractable nausea and vomiting secondary to reoccurrence of constipation requiring aggressive IV therapy. atient lives independently at baseline, but is currently unable to sit up or walk, safe disposition to rehab is pending insurance authroization   Dispo: The patient is from: Home              Anticipated d/c is to: SNF              Anticipated d/c date is: 1 day              Patient currently is medically not stable to d/c.  DVT  prophylaxis: Lovenox Code Status: Full code Family Communication: None at bedside.  Antimicrobials:   Ceftriaxone/Flagyl 5/15>>  Culture data:    5/15 blood cultures x2-gram positive rods in 1/2, likely contaminant, speciation pending  Subjective: Reports 4 episodes of vomiting this morning.  No abdominal pain.  No fever no chills.  Reports discomfort while having bowel movement.    Objective: Vitals:   03/22/20 0344 03/22/20 0752 03/22/20 1136 03/22/20 1637  BP: (!) 148/90 (!) 155/95 (!) 154/90 (!) 160/89  Pulse: 77 74 81 82  Resp: 20 18 18    Temp: 98.1 F (36.7 C) 97.8 F (36.6 C) 98.1 F (36.7 C) 98 F (36.7 C)  TempSrc: Oral Oral Oral Oral  SpO2: 96% 94% 92% 94%  Weight: 53.7 kg     Height:        Intake/Output Summary (Last 24 hours) at 03/22/2020 1927 Last data filed at 03/22/2020 1913 Gross per 24 hour  Intake 600 ml  Output 800 ml  Net -200 ml   Filed Weights   03/20/20 0535 03/21/20 0418 03/22/20 0344  Weight: 53.2 kg 54.5 kg 53.7 kg    Examination:  General: Appear in moderate distress, no Rash; Oral Mucosa Clear, moist. Cardiovascular: S1 and S2 Present, no Murmur, Respiratory: normal respiratory effort, Bilateral Air entry present and no Crackles, no wheezes Abdomen: Bowel Sound present, Soft and  mild tenderness, no hernia Extremities: no Pedal edema, no calf tenderness Neurology: alert and oriented to time, place, and person affect appropriate.  Chronic right-sided contracture   Data Reviewed: I have personally reviewed following labs and imaging studies:  CBC: Recent Labs  Lab 03/17/20 1430 03/18/20 0320 03/19/20 0531 03/20/20 0503  WBC 19.5* 9.3 4.6 4.6  NEUTROABS 8.8*  --   --   --   HGB 16.9 12.8* 12.3* 13.1  HCT 49.4 36.1* 37.2* 38.0*  MCV 90.6 90.3 94.2 92.5  PLT 375 211 141* 229*   Basic Metabolic Panel: Recent Labs  Lab 03/17/20 1430 03/17/20 1626 03/18/20 0320 03/19/20 0531 03/20/20 0503 03/21/20 0546 03/22/20 0527  NA 128*  --  136 140 140  --  138  K 4.1  --  3.6 3.5 3.4*  --  3.4*  CL 86*  --  101 105 107  --  104  CO2 26  --  27 27 28   --  27  GLUCOSE  113*  --  107* 98 120*  --  109*  BUN 6*  --  6* <5* 7*  --  8  CREATININE 0.53*  --  0.47* <0.30* 0.48*  --  0.49*  CALCIUM 8.9  --  8.0* 8.4* 8.8*  --  8.6*  MG 2.0  --   --  1.8 1.7 1.5* 1.9  PHOS  --  3.2  --  2.5 3.7 3.4 3.9   GFR: Estimated Creatinine Clearance: 67.1 mL/min (A) (by C-G formula based on SCr of 0.49 mg/dL (L)). Liver Function Tests: Recent Labs  Lab 03/17/20 1430 03/19/20 0531 03/20/20 0503  AST 36 23 29  ALT 25 20 21   ALKPHOS 96 73 83  BILITOT 1.1 0.8 0.6  PROT 8.4* 6.4* 6.1*  ALBUMIN 4.6 3.5 3.3*   Recent Labs  Lab 03/17/20 1430  LIPASE 22   Recent Labs  Lab 03/17/20 1430  AMMONIA 14   Coagulation Profile: No results for input(s): INR, PROTIME in the last 168 hours. Cardiac Enzymes: Recent Labs  Lab 03/17/20 1430  CKTOTAL 88   BNP (last 3  results) No results for input(s): PROBNP in the last 8760 hours. HbA1C: No results for input(s): HGBA1C in the last 72 hours. CBG: No results for input(s): GLUCAP in the last 168 hours. Lipid Profile: No results for input(s): CHOL, HDL, LDLCALC, TRIG, CHOLHDL, LDLDIRECT in the last 72 hours. Thyroid Function Tests: No results for input(s): TSH, T4TOTAL, FREET4, T3FREE, THYROIDAB in the last 72 hours. Anemia Panel: No results for input(s): VITAMINB12, FOLATE, FERRITIN, TIBC, IRON, RETICCTPCT in the last 72 hours. Urine analysis:    Component Value Date/Time   COLORURINE YELLOW (A) 03/17/2020 1346   APPEARANCEUR HAZY (A) 03/17/2020 1346   LABSPEC 1.029 03/17/2020 1346   PHURINE 5.0 03/17/2020 1346   GLUCOSEU NEGATIVE 03/17/2020 1346   HGBUR NEGATIVE 03/17/2020 1346   BILIRUBINUR NEGATIVE 03/17/2020 1346   KETONESUR 5 (A) 03/17/2020 1346   PROTEINUR 100 (A) 03/17/2020 1346   NITRITE NEGATIVE 03/17/2020 1346   LEUKOCYTESUR NEGATIVE 03/17/2020 1346   Recent Results (from the past 240 hour(s))  SARS Coronavirus 2 by RT PCR (hospital order, performed in [place] hospital lab) Nasopharyngeal  Nasopharyngeal Swab     Status: None   Collection Time: 03/17/20  2:30 PM   Specimen: Nasopharyngeal Swab  Result Value Ref Range Status   SARS Coronavirus 2 NEGATIVE NEGATIVE Final    Comment: (NOTE) SARS-CoV-2 target nucleic acids are NOT DETECTED. The SARS-CoV-2 RNA is generally detectable in upper and lower respiratory specimens during the acute phase of infection. The lowest concentration of SARS-CoV-2 viral copies this assay can detect is 250 copies / mL. A negative result does not preclude SARS-CoV-2 infection and should not be used as the sole basis for treatment or other patient management decisions.  A negative result may occur with improper specimen collection / handling, submission of specimen other than nasopharyngeal swab, presence of viral mutation(s) within the areas targeted by this assay, and inadequate number of viral copies (<250 copies / mL). A negative result must be combined with clinical observations, patient history, and epidemiological information. Fact Sheet for Patients:   StrictlyIdeas.no Fact Sheet for Healthcare Providers: BankingDealers.co.za This test is not yet approved or cleared  by the [place] FDA and has been authorized for detection and/or diagnosis of SARS-CoV-2 by FDA under an Emergency Use Authorization (EUA).  This EUA will remain in effect (meaning this test can be used) for the duration of the COVID-19 declaration under Section 564(b)(1) of the Act, 21 U.S.C. section 360bbb-3(b)(1), unless the authorization is terminated or revoked sooner. Performed at [facility], [address] [place], [place] [address]   CULTURE, BLOOD (ROUTINE X 2) w Reflex to ID Panel     Status: Abnormal   Collection Time: 03/17/20  2:30 PM   Specimen: BLOOD  Result Value Ref Range Status   Specimen Description   Final    BLOOD BLOOD LEFT FOREARM Performed at [facility], [address] [place], [place] [address]    Special Requests   Final    BOTTLES DRAWN AEROBIC AND ANAEROBIC Blood Culture adequate volume Performed at [facility], [address] [place], [place] [address]    Culture  Setup Time   Final    GRAM POSITIVE RODS AEROBIC BOTTLE ONLY CRITICAL RESULT CALLED TO, READ BACK BY AND VERIFIED WITH: [place] AT 0508 ON 03/19/20 RWW Performed at [facility], [address] [place], [place] [address]    Culture (A)  Final    DIPHTHEROIDS(CORYNEBACTERIUM SPECIES) Standardized susceptibility testing  for this organism is not available. Performed at [facility], [place] [address], [place], [place] [address]    Report Status 03/21/2020 FINAL  Final  CULTURE, BLOOD (ROUTINE X 2) w Reflex to ID Panel     Status: None (Preliminary result)   Collection Time: 03/18/20  3:22 AM   Specimen: BLOOD  Result Value Ref Range Status   Specimen Description BLOOD BLOOD LEFT WRIST  Final   Special Requests   Final    BOTTLES DRAWN AEROBIC AND ANAEROBIC Blood Culture results may not be optimal due to an excessive volume of blood received in culture bottles   Culture   Final    NO GROWTH 4 DAYS Performed at [facility], [address], [place], [place] [address]    Report Status PENDING  Incomplete  SARS CORONAVIRUS 2 (TAT 6-24 HRS) Nasopharyngeal Nasopharyngeal Swab     Status: None   Collection Time: 03/20/20 10:28 AM   Specimen: Nasopharyngeal Swab  Result Value Ref Range Status   SARS Coronavirus 2 NEGATIVE NEGATIVE Final    Comment: (NOTE) SARS-CoV-2 target nucleic acids are NOT DETECTED. The SARS-CoV-2 RNA is generally detectable in upper and lower respiratory specimens during the acute phase of infection. Negative results do not preclude SARS-CoV-2 infection, do not rule out co-infections with other pathogens, and should not be used as the sole basis for treatment or other patient management decisions. Negative results must be  combined with clinical observations, patient history, and epidemiological information. The expected result is Negative. Fact Sheet for Patients: SugarRoll.be Fact Sheet for Healthcare Providers: https://www.woods-mathews.com/ This test is not yet approved or cleared by the [place] FDA and  has been authorized for detection and/or diagnosis of SARS-CoV-2 by FDA under an Emergency Use Authorization (EUA). This EUA will remain  in effect (meaning this test can be used) for the duration of the COVID-19 declaration under Section 56 4(b)(1) of the Act, 21 U.S.C. section 360bbb-3(b)(1), unless the authorization is terminated or revoked sooner. Performed at [facility], [place] [address] [place], [place] [address]          Radiology Studies: DG Abd Portable 1V  Result Date: 03/22/2020 CLINICAL DATA:  Severe nausea and episode of vomiting. EXAM: PORTABLE ABDOMEN - 1 VIEW COMPARISON:  03/18/2020 FINDINGS: Bowel gas pattern is nonobstructive. Several air-filled nondilated loops of large and small bowel. Stable gaseous distention of the stomach. No free peritoneal air. Surgical clips over the right lower abdomen. Phlebolith over the right pelvis. Remainder of the exam is unchanged. IMPRESSION: Nonobstructive bowel gas pattern Electronically Signed   By: Marin Olp M.D.   On: 03/22/2020 10:57        Scheduled Meds: . amLODipine  5 mg Oral Daily  . baclofen  10 mg Oral TID  . enoxaparin (LOVENOX) injection  40 mg Subcutaneous Q24H  . feeding supplement (ENSURE ENLIVE)  237 mL Oral BID BM  . folic acid  1 mg Oral Daily  . hydrocortisone   Rectal BID  . lactulose  20 g Oral TID  . metoCLOPramide (REGLAN) injection  5 mg Intravenous Q6H  . multivitamin with minerals  1 tablet Oral Daily  . pantoprazole  40 mg Oral Daily  . polyethylene glycol  17 g Oral BID  . thiamine  100 mg Oral Daily   Or  . thiamine  100 mg Intravenous Daily    Continuous Infusions: . sodium chloride 250 mL (03/22/20 0433)  . lactated ringers 100  mL/hr at 03/22/20 1337     LOS: 4 days    Time spent: 35 minutes    Berle Mull, MD [facility] 03/22/2020, 7:27 PM     Please page though [place] or Epic secure chat:  For [employer], Adult nurse

## 2020-03-22 NOTE — Progress Notes (Signed)
Patient complained earlier of severe nausea; was given zofran 4 mg IV, and subsequently vomited large amount of undigested food. Stated felt a little better after vomiting, but that the zofran did not help much.   Patient now complains of increased anxiety, and requested something to help him sleep. Discussed with Rachael Fee, APP on call, and received order for Ativan 0.5 mg IV. CIWA scale also assessed and documented; will CTM. Covering APP updated regarding patient's symptoms. Admits to headache as well.

## 2020-03-22 NOTE — TOC Progression Note (Signed)
Transition of Care (TOC) - Progression Note    Patient Details  Name: Douglas Peterson. MRN: [id number] Date of Birth: 1951-02-07  Transition of Care [facility]) CM/SW [place], RN Phone Number: 03/22/2020, 3:13 PM  Clinical Narrative:    [name] denied SNF auth stating no skilled need.  Dr Posey Pronto given information to call for peer to peer.  "The phone number for regular appeal is [phone] option 3, or expedited appeal at [phone]. This needs to be done by 430 today."    Expected Discharge Plan: Skilled Nursing Facility Barriers to Discharge: Barriers Resolved  Expected Discharge Plan and Services Expected Discharge Plan: [place]   Discharge Planning Services: CM Consult   Living arrangements for the past 2 months: Single Family Home Expected Discharge Date: 03/21/20                 DME Agency: NA                   Social Determinants of Health ([place]) Interventions    Readmission Risk Interventions Readmission Risk Prevention Plan 12/13/2019 12/12/2019  Transportation Screening Complete Complete  PCP or Specialist Appt within 3-5 Days (No Data) -  HRI or [place] (No Data) -  Palliative Care Screening Complete Complete  Medication Review (RN Care Manager) Complete Complete  Some recent data might be hidden

## 2020-03-23 ENCOUNTER — Inpatient Hospital Stay: Payer: Medicare HMO

## 2020-03-23 LAB — CULTURE, BLOOD (ROUTINE X 2): Culture: NO GROWTH

## 2020-03-23 LAB — BASIC METABOLIC PANEL
Anion gap: 7 (ref 5–15)
BUN: <5 mg/dL — ABNORMAL LOW (ref 8–23)
CO2: 27 mmol/L (ref 22–32)
Calcium: 8.9 mg/dL (ref 8.9–10.3)
Chloride: 101 mmol/L (ref 98–111)
Creatinine, Ser: 0.45 mg/dL — ABNORMAL LOW (ref 0.61–1.24)
GFR calc Af Amer: >60 mL/min (ref >60)
GFR calc non Af Amer: >60 mL/min (ref >60)
Glucose, Bld: 91 mg/dL (ref 70–99)
Potassium: 3.9 mmol/L (ref 3.5–5.1)
Sodium: 135 mmol/L (ref 135–145)

## 2020-03-23 LAB — MAGNESIUM: Magnesium: 1.9 mg/dL (ref 1.7–2.4)

## 2020-03-23 LAB — PHOSPHORUS: Phosphorus: 3.7 mg/dL (ref 2.5–4.6)

## 2020-03-23 NOTE — Progress Notes (Signed)
Physical Therapy Treatment Patient Details Name: Douglas Peterson. MRN: [id number] DOB: 06/23/1951 Today's Date: 03/23/2020    History of Present Illness  69 y.o. M with hx of alcohol use disorder, HTN, TBI with right-sided hemiparesis, recent admission for intoxicating complicated by refeeding syndrome and atrial fibrillation who presents with a few days progressive lower abdominal pain, urge to defecate and constipation, now severe malaise.  Pt had not had a BM for ~1 week and is endorsing consistent and signifcant rectal pain.  Pt was d/c to rehab 4/29 after a fall at home, found by EMS 5/15 at home, in bed, covered in urine and surrounded by empty beer cans.    PT Comments    Pt had asked to walk again if time allows today.  I was able to return and he is able to walk x 3 around unit with SPC and min guard/assist.  Balance continues to remain decreased with several incidents of LOB requiring assist to prevent potential falls.  Stepping pattern to recover.  He remains highly motivated to walk and increase strength/balance.   Follow Up Recommendations  SNF     Equipment Recommendations  None recommended by PT    Recommendations for Other Services       Precautions / Restrictions Precautions Precautions: Fall Restrictions Weight Bearing Restrictions: No    Mobility  Bed Mobility Overal bed mobility: Needs Assistance Bed Mobility: Sit to Supine     Supine to sit: Min guard Sit to supine: Supervision   General bed mobility comments: light assist to get to EOB and heavy use of rail  Transfers Overall transfer level: Needs assistance Equipment used: Straight cane Transfers: Sit to/from Stand Sit to Stand: Min guard;Min assist         General transfer comment: Min A for stability but no physical assist required to come to standing  Ambulation/Gait Ambulation/Gait assistance: Min guard;Min assist Gait Distance (Feet): 480 Feet Assistive device: Straight cane Gait  Pattern/deviations: Step-to pattern Gait velocity: decreased   General Gait Details: 3 laps around unit with generally unsteady gait and several incidences where he needed assist to recover to prevent protential falls.  Very motivated to walk.   Stairs             Wheelchair Mobility    Modified Rankin (Stroke Patients Only)       Balance Overall balance assessment: Needs assistance Sitting-balance support: Feet unsupported;No upper extremity supported Sitting balance-Leahy Scale: Good     Standing balance support: During functional activity;Single extremity supported Standing balance-Leahy Scale: Poor Standing balance comment: Min to mod A for stability in standing and during amb                            Cognition Arousal/Alertness: Awake/alert Behavior During Therapy: WFL for tasks assessed/performed Overall Cognitive Status: Within Functional Limits for tasks assessed                                        Exercises      General Comments        Pertinent Vitals/Pain Pain Assessment: Faces Faces Pain Scale: Hurts a little bit Pain Location: stomach limiting gait Pain Descriptors / Indicators: Dull;Sore Pain Intervention(s): Limited activity within patient's tolerance;Monitored during session;Repositioned    Home Living  Prior Function            PT Goals (current goals can now be found in the care plan section) Progress towards PT goals: Progressing toward goals    Frequency    Min 2X/week      PT Plan Current plan remains appropriate    Co-evaluation              AM-PAC PT "6 Clicks" Mobility   Outcome Measure  Help needed turning from your back to your side while in a flat bed without using bedrails?: A Little Help needed moving from lying on your back to sitting on the side of a flat bed without using bedrails?: A Little Help needed moving to and from a bed to a chair  (including a wheelchair)?: A Little Help needed standing up from a chair using your arms (e.g., wheelchair or bedside chair)?: A Little Help needed to walk in hospital room?: A Little Help needed climbing 3-5 steps with a railing? : A Lot 6 Click Score: 17    End of Session Equipment Utilized During Treatment: Gait belt Activity Tolerance: Patient tolerated treatment well Patient left: in chair;with call bell/phone within reach;with chair alarm set Nurse Communication: Mobility status;Other (comment)       Time: 4862-8241 PT Time Calculation (min) (ACUTE ONLY): 15 min  Charges:  $Gait Training: 8-22 mins                   Chesley Noon, PTA 03/23/20, 2:48 PM

## 2020-03-23 NOTE — Progress Notes (Signed)
Physical Therapy Treatment Patient Details Name: Douglas Peterson. MRN: [id number] DOB: [birth year] Today's Date: 03/23/2020    History of Present Illness  69 y.o. M with hx of alcohol use disorder, HTN, TBI with right-sided hemiparesis, recent admission for intoxicating complicated by refeeding syndrome and atrial fibrillation who presents with a few days progressive lower abdominal pain, urge to defecate and constipation, now severe malaise.  Pt had not had a BM for ~1 week and is endorsing consistent and signifcant rectal pain.  Pt was d/c to rehab 4/29 after a fall at home, found by EMS 5/15 at home, in bed, covered in urine and surrounded by empty beer cans.    PT Comments    On bedpan upon arrival.  Medium loose BM and care provided.  Does well rolling with rail to assist.  To EOB with min guard/assist and rail.  Steady in sitting.  Stood with min a for balance and is able to progress gait x 1 lap around unit with min guard/assist x 1.  Overall decreased balance noted especially with turns and when encountering increased activity in hallway with one LOB which required min a to prevent a potential fall.  Pt limited by stomach pain and increasing nausea with gait.  Large belch upon returning to room but no inc BM with activity.  Remained in recliner after session.  Pt reports his balance is far from baseline and he feels he walks much better normally.  SNF remains a recommended discharge disposition.   Follow Up Recommendations  SNF     Equipment Recommendations  None recommended by PT    Recommendations for Other Services       Precautions / Restrictions Precautions Precautions: Fall Restrictions Weight Bearing Restrictions: No    Mobility  Bed Mobility Overal bed mobility: Needs Assistance Bed Mobility: Supine to Sit     Supine to sit: Min guard     General bed mobility comments: light assist to get to EOB and heavy use of rail  Transfers Overall transfer level: Needs  assistance Equipment used: Straight cane Transfers: Sit to/from Stand Sit to Stand: Min guard;Min assist         General transfer comment: Min A for stability but no physical assist required to come to standing  Ambulation/Gait Ambulation/Gait assistance: Min guard;Min assist Gait Distance (Feet): 160 Feet Assistive device: Straight cane Gait Pattern/deviations: Step-to pattern Gait velocity: decreased   General Gait Details: Slow, step-to cadence with min to mod A for stability - remains generally unsteady with on LOB in hallway with increased traffic which required min a x 1 to possibly prevent fall.   Stairs             Wheelchair Mobility    Modified Rankin (Stroke Patients Only)       Balance Overall balance assessment: Needs assistance Sitting-balance support: Feet unsupported;No upper extremity supported Sitting balance-Leahy Scale: Good     Standing balance support: During functional activity;Single extremity supported Standing balance-Leahy Scale: Poor Standing balance comment: Min to mod A for stability in standing and during amb                            Cognition Arousal/Alertness: Awake/alert Behavior During Therapy: WFL for tasks assessed/performed Overall Cognitive Status: Within Functional Limits for tasks assessed  Exercises      General Comments        Pertinent Vitals/Pain Pain Assessment: Faces Faces Pain Scale: Hurts little more Pain Location: stomach limiting gait Pain Descriptors / Indicators: Dull;Sore Pain Intervention(s): Limited activity within patient's tolerance;Monitored during session;Repositioned    Home Living                      Prior Function            PT Goals (current goals can now be found in the care plan section) Progress towards PT goals: Progressing toward goals    Frequency    Min 2X/week      PT Plan Current plan  remains appropriate    Co-evaluation              AM-PAC PT "6 Clicks" Mobility   Outcome Measure  Help needed turning from your back to your side while in a flat bed without using bedrails?: A Little Help needed moving from lying on your back to sitting on the side of a flat bed without using bedrails?: A Little Help needed moving to and from a bed to a chair (including a wheelchair)?: A Little Help needed standing up from a chair using your arms (e.g., wheelchair or bedside chair)?: A Little Help needed to walk in hospital room?: A Little Help needed climbing 3-5 steps with a railing? : A Lot 6 Click Score: 17    End of Session Equipment Utilized During Treatment: Gait belt Activity Tolerance: Patient tolerated treatment well Patient left: in chair;with call bell/phone within reach;with chair alarm set Nurse Communication: Mobility status;Other (comment)       Time: 1438-8875 PT Time Calculation (min) (ACUTE ONLY): 15 min  Charges:  $Gait Training: 8-22 mins                    Chesley Noon, PTA 03/23/20, 1:39 PM

## 2020-03-23 NOTE — Progress Notes (Signed)
PROGRESS NOTE    Douglas Peterson.  ASN:[id number] DOB: [birth year] DOA: 03/17/2020 PCP: [name], MD   Brief Narrative:  Douglas Peterson is a 69 y.o. M with hx of alcohol use disorder, HTN, TBI with right-sided hemiparesis, recent admission for intoxicating complicated by refeeding syndrome and atrial fibrillation who presents with a few days progressive lower abdominal pain, urge to defecate and constipation, now severe malaise.  Patient for started to notice constipation and pain in his rectum a few days ago, as if he had the urge to poop but could not.  This progressively got worse and he started to feel generalized malaise so he called 911 today.  EMS found him in bed, covered in urine, surrounded by beer cans and brought him to the ER.  In the ER afebrile, heart rate 105, respirations 24, blood pressure normal, pulse ox normal.  WBC 19 K, sodium 128, anion gap elevated, lactate 4.7.  Alcohol level is 266.  CT head and C-spine unremarkable, CT abdomen pelvis showed large volume of impacted stool in the colon, proctitis.  Chest x-ray clear, urinalysis without cells.  He was given 30 cc per cake IV fluids, and Zosyn was given empirically.  Assessment & Plan: Sepsis due to proctitis. Severe dehydration. Presents with tachycardia leukocytosis tachypnea and elevated lactate. CT scan shows evidence of proctitis. No significant evidence of abscess or fluid collection. Patient with severe rectal pain. Cultures so far negative. Treated with IV ceftriaxone and IV Flagyl. Symptoms have so far improved. Currently patient will receive 3 more days of oral antibiotics.  Bowel impaction. Intractable nausea and vomiting Recurrent issue for the patient. Aggressive bowel regimen was initiated. On 03/22/2020 patient reported 4 episodes of vomiting. X-ray abdomen shows evidence of constipation. Currently we will initiate scheduled Reglan along with aggressive bowel regimen. Repeat x-ray on  03/23/2020 shows improvement in constipation but patient continues to have symptomatic with nausea and vomiting. Continue with MiraLAX and Colace on discharge but currently holding. Schedule Reglan IV  Alcohol abuse disorder. CIWA score improving. Continue thiamine and folic acid. Counseling provided on alcohol cessation. Outpatient resources provided.  Paroxysmal A. fib. Not on anticoagulation due to history of thrombocytopenia, ongoing alcohol use as well as recurrent falls. Currently rate controlled in fact actually had bradycardic events during the hospital stay. Patient was on atenolol currently transition to Norvasc due to bradycardic events.  Essential hypertension. Bradycardia. Blood pressure high but heart rate in 50s. Atenolol discontinued. Currently on Norvasc 5 mg. At risk for constipation due to calcium channel blocker medication. Continue aggressive bowel regimen.  Hyponatremia. Likely from poor p.o. intake. Resolved. IV hydration resumed.  History of traumatic brain injury. Chronic spasticity. Continue home medication.  GERD. Continue PPI.  Severe protein calorie malnutrition. Body mass index is 21.29 kg/m.  Nutrition Problem: Severe Malnutrition Etiology: social / environmental circumstances(EtOH use, inadequate oral intake) Nutrition Interventions: Interventions: Refer to RD note for recommendations Continue supplements.  Disposition: Status is: INPATIENT  Remains inpatient appropriate: Patient with intractable nausea and vomiting secondary to reoccurrence of constipation requiring aggressive IV therapy. Patient lives independently at baseline, but is currently unable to sit up or walk, safe disposition to rehab is pending insurance authroization   Dispo: The patient is from: Home              Anticipated d/c is to: SNF              Anticipated d/c date is: 1 day  Patient currently is medically not stable to d/c.  DVT  prophylaxis: Lovenox Code Status: Full code Family Communication: None at bedside.  Antimicrobials:   Ceftriaxone/Flagyl 5/15>>  Culture data:   5/15 blood cultures x2-gram positive rods in 1/2, likely contaminant, speciation pending  Subjective: Persistent nausea this morning.  2 episodes of vomiting.  No fever no chills.  Intermittently tolerating sips of water.  Objective: Vitals:   03/23/20 0347 03/23/20 0829 03/23/20 1209 03/23/20 1553  BP: (!) 144/84 (!) 148/106 (!) 155/98 (!) 145/96  Pulse: 78 67 69 75  Resp:  18 18 18   Temp: 98.1 F (36.7 C) 98.1 F (36.7 C)    TempSrc: Oral Oral    SpO2: 96% 97% 97% 94%  Weight: 50.9 kg     Height:        Intake/Output Summary (Last 24 hours) at 03/23/2020 1720 Last data filed at 03/23/2020 1345 Gross per 24 hour  Intake 720 ml  Output 1660 ml  Net -940 ml   Filed Weights   03/21/20 0418 03/22/20 0344 03/23/20 0347  Weight: 54.5 kg 53.7 kg 50.9 kg    Examination:  General: Appear in moderate distress, no Rash; Oral Mucosa Clear, moist. Cardiovascular: S1 and S2 Present, no Murmur, Respiratory: normal respiratory effort, Bilateral Air entry present and no Crackles, no wheezes Abdomen: Bowel Sound present, Soft and  mild tenderness, no hernia Extremities: no Pedal edema, no calf tenderness Neurology: alert and oriented to time, place, and person affect appropriate.  Chronic right-sided contracture   Data Reviewed: I have personally reviewed following labs and imaging studies:  CBC: Recent Labs  Lab 03/17/20 1430 03/18/20 0320 03/19/20 0531 03/20/20 0503  WBC 19.5* 9.3 4.6 4.6  NEUTROABS 8.8*  --   --   --   HGB 16.9 12.8* 12.3* 13.1  HCT 49.4 36.1* 37.2* 38.0*  MCV 90.6 90.3 94.2 92.5  PLT 375 211 141* 269*   Basic Metabolic Panel: Recent Labs  Lab 03/17/20 1430 03/18/20 0320 03/19/20 0531 03/20/20 0503 03/21/20 0546 03/22/20 0527 03/23/20 0548  NA  --  136 140 140  --  138 135  K  --  3.6 3.5 3.4*   --  3.4* 3.9  CL  --  101 105 107  --  104 101  CO2  --  27 27 28   --  27 27  GLUCOSE  --  107* 98 120*  --  109* 91  BUN  --  6* <5* 7*  --  8 <5*  CREATININE  --  0.47* <0.30* 0.48*  --  0.49* 0.45*  CALCIUM  --  8.0* 8.4* 8.8*  --  8.6* 8.9  MG   < >  --  1.8 1.7 1.5* 1.9 1.9  PHOS   < >  --  2.5 3.7 3.4 3.9 3.7   < > = values in this interval not displayed.   GFR: Estimated Creatinine Clearance: 63.6 mL/min (A) (by C-G formula based on SCr of 0.45 mg/dL (L)). Liver Function Tests: Recent Labs  Lab 03/17/20 1430 03/19/20 0531 03/20/20 0503  AST 36 23 29  ALT 25 20 21   ALKPHOS 96 73 83  BILITOT 1.1 0.8 0.6  PROT 8.4* 6.4* 6.1*  ALBUMIN 4.6 3.5 3.3*   Recent Labs  Lab 03/17/20 1430  LIPASE 22   Recent Labs  Lab 03/17/20 1430  AMMONIA 14   Coagulation Profile: No results for input(s): INR, PROTIME in the last 168 hours. Cardiac Enzymes: Recent Labs  Lab 03/17/20 1430  CKTOTAL 88   BNP (last 3 results) No results for input(s): PROBNP in the last 8760 hours. HbA1C: No results for input(s): HGBA1C in the last 72 hours. CBG: No results for input(s): GLUCAP in the last 168 hours. Lipid Profile: No results for input(s): CHOL, HDL, LDLCALC, TRIG, CHOLHDL, LDLDIRECT in the last 72 hours. Thyroid Function Tests: No results for input(s): TSH, T4TOTAL, FREET4, T3FREE, THYROIDAB in the last 72 hours. Anemia Panel: No results for input(s): VITAMINB12, FOLATE, FERRITIN, TIBC, IRON, RETICCTPCT in the last 72 hours. Urine analysis:    Component Value Date/Time   COLORURINE YELLOW (A) 03/17/2020 1346   APPEARANCEUR HAZY (A) 03/17/2020 1346   LABSPEC 1.029 03/17/2020 1346   PHURINE 5.0 03/17/2020 1346   GLUCOSEU NEGATIVE 03/17/2020 1346   HGBUR NEGATIVE 03/17/2020 1346   BILIRUBINUR NEGATIVE 03/17/2020 1346   KETONESUR 5 (A) 03/17/2020 1346   PROTEINUR 100 (A) 03/17/2020 1346   NITRITE NEGATIVE 03/17/2020 1346   LEUKOCYTESUR NEGATIVE 03/17/2020 1346   Recent  Results (from the past 240 hour(s))  SARS Coronavirus 2 by RT PCR (hospital order, performed in [place] hospital lab) Nasopharyngeal Nasopharyngeal Swab     Status: None   Collection Time: 03/17/20  2:30 PM   Specimen: Nasopharyngeal Swab  Result Value Ref Range Status   SARS Coronavirus 2 NEGATIVE NEGATIVE Final    Comment: (NOTE) SARS-CoV-2 target nucleic acids are NOT DETECTED. The SARS-CoV-2 RNA is generally detectable in upper and lower respiratory specimens during the acute phase of infection. The lowest concentration of SARS-CoV-2 viral copies this assay can detect is 250 copies / mL. A negative result does not preclude SARS-CoV-2 infection and should not be used as the sole basis for treatment or other patient management decisions.  A negative result may occur with improper specimen collection / handling, submission of specimen other than nasopharyngeal swab, presence of viral mutation(s) within the areas targeted by this assay, and inadequate number of viral copies (<250 copies / mL). A negative result must be combined with clinical observations, patient history, and epidemiological information. Fact Sheet for Patients:   StrictlyIdeas.no Fact Sheet for Healthcare Providers: BankingDealers.co.za This test is not yet approved or cleared  by the [place] FDA and has been authorized for detection and/or diagnosis of SARS-CoV-2 by FDA under an Emergency Use Authorization (EUA).  This EUA will remain in effect (meaning this test can be used) for the duration of the COVID-19 declaration under Section 564(b)(1) of the Act, 21 U.S.C. section 360bbb-3(b)(1), unless the authorization is terminated or revoked sooner. Performed at [facility], [address] [place], [place] [address]   CULTURE, BLOOD (ROUTINE X 2) w Reflex to ID Panel     Status: Abnormal   Collection Time: 03/17/20  2:30 PM   Specimen: BLOOD  Result  Value Ref Range Status   Specimen Description   Final    BLOOD BLOOD LEFT FOREARM Performed at [facility], [address] [place], [place] [address]    Special Requests   Final    BOTTLES DRAWN AEROBIC AND ANAEROBIC Blood Culture adequate volume Performed at [facility], [address], [place], [place] [address]    Culture  Setup Time   Final    GRAM POSITIVE RODS AEROBIC BOTTLE ONLY CRITICAL RESULT CALLED TO, READ BACK BY AND VERIFIED WITH: Scurry AT 0508 ON 03/19/20 RWW Performed at [facility], [address] [place], [place] [address]    Culture (  A)  Final    DIPHTHEROIDS(CORYNEBACTERIUM SPECIES) Standardized susceptibility testing for this organism is not available. Performed at [facility], [place] [address], [place], [place] [address]    Report Status 03/21/2020 FINAL  Final  CULTURE, BLOOD (ROUTINE X 2) w Reflex to ID Panel     Status: None   Collection Time: 03/18/20  3:22 AM   Specimen: BLOOD  Result Value Ref Range Status   Specimen Description BLOOD BLOOD LEFT WRIST  Final   Special Requests   Final    BOTTLES DRAWN AEROBIC AND ANAEROBIC Blood Culture results may not be optimal due to an excessive volume of blood received in culture bottles   Culture   Final    NO GROWTH 5 DAYS Performed at [facility], [address], [place], [place] [address]    Report Status 03/23/2020 FINAL  Final  SARS CORONAVIRUS 2 (TAT 6-24 HRS) Nasopharyngeal Nasopharyngeal Swab     Status: None   Collection Time: 03/20/20 10:28 AM   Specimen: Nasopharyngeal Swab  Result Value Ref Range Status   SARS Coronavirus 2 NEGATIVE NEGATIVE Final    Comment: (NOTE) SARS-CoV-2 target nucleic acids are NOT DETECTED. The SARS-CoV-2 RNA is generally detectable in upper and lower respiratory specimens during the acute phase of infection. Negative results do not preclude SARS-CoV-2 infection, do not rule out co-infections with other pathogens, and  should not be used as the sole basis for treatment or other patient management decisions. Negative results must be combined with clinical observations, patient history, and epidemiological information. The expected result is Negative. Fact Sheet for Patients: SugarRoll.be Fact Sheet for Healthcare Providers: https://www.woods-mathews.com/ This test is not yet approved or cleared by the [place] FDA and  has been authorized for detection and/or diagnosis of SARS-CoV-2 by FDA under an Emergency Use Authorization (EUA). This EUA will remain  in effect (meaning this test can be used) for the duration of the COVID-19 declaration under Section 56 4(b)(1) of the Act, 21 U.S.C. section 360bbb-3(b)(1), unless the authorization is terminated or revoked sooner. Performed at [facility], [place] [address] [place], [place] [address]          Radiology Studies: DG Abd Portable 1V  Result Date: 03/23/2020 CLINICAL DATA:  Abdominal pain with vomiting EXAM: PORTABLE ABDOMEN - 1 VIEW COMPARISON:  Mar 22, 2020 FINDINGS: There is no bowel dilatation or air-fluid level to suggest bowel obstruction. No free air evident on this supine examination. There is postoperative change in the right abdomen. [name] status post kyphoplasty at L2. IMPRESSION: No bowel obstruction or free air evident. Electronically Signed   By: Lowella Grip III M.D.   On: 03/23/2020 08:54   DG Abd Portable 1V  Result Date: 03/22/2020 CLINICAL DATA:  Severe nausea and episode of vomiting. EXAM: PORTABLE ABDOMEN - 1 VIEW COMPARISON:  03/18/2020 FINDINGS: Bowel gas pattern is nonobstructive. Several air-filled nondilated loops of large and small bowel. Stable gaseous distention of the stomach. No free peritoneal air. Surgical clips over the right lower abdomen. Phlebolith over the right pelvis. Remainder of the exam is unchanged. IMPRESSION: Nonobstructive bowel gas pattern  Electronically Signed   By: Marin Olp M.D.   On: 03/22/2020 10:57        Scheduled Meds: . baclofen  10 mg Oral TID  . enoxaparin (LOVENOX) injection  40 mg Subcutaneous Q24H  . feeding supplement (ENSURE ENLIVE)  237 mL Oral BID BM  . folic acid  1 mg  Oral Daily  . hydrocortisone   Rectal BID  . metoCLOPramide (REGLAN) injection  5 mg Intravenous Q6H  . multivitamin with minerals  1 tablet Oral Daily  . pantoprazole  40 mg Oral Daily  . thiamine  100 mg Oral Daily   Or  . thiamine  100 mg Intravenous Daily   Continuous Infusions: . sodium chloride 250 mL (03/22/20 0433)     LOS: 5 days    Time spent: 35 minutes    Berle Mull, MD [facility] 03/23/2020, 5:20 PM     Please page though [place] or Epic secure chat:  For [employer], Adult nurse

## 2020-03-23 NOTE — Care Management Important Message (Signed)
Important Message  Patient Details  Name: Douglas Peterson. MRN: [id number] Date of Birth: 22-Jun-1951   Medicare Important Message Given:  Yes     [name] 03/23/2020, 1:08 PM

## 2020-03-24 LAB — BASIC METABOLIC PANEL
Anion gap: 11 (ref 5–15)
BUN: 5 mg/dL — ABNORMAL LOW (ref 8–23)
CO2: 22 mmol/L (ref 22–32)
Calcium: 8.9 mg/dL (ref 8.9–10.3)
Chloride: 99 mmol/L (ref 98–111)
Creatinine, Ser: 0.46 mg/dL — ABNORMAL LOW (ref 0.61–1.24)
GFR calc Af Amer: >60 mL/min (ref >60)
GFR calc non Af Amer: >60 mL/min (ref >60)
Glucose, Bld: 79 mg/dL (ref 70–99)
Potassium: 4 mmol/L (ref 3.5–5.1)
Sodium: 132 mmol/L — ABNORMAL LOW (ref 135–145)

## 2020-03-24 LAB — MAGNESIUM: Magnesium: 1.9 mg/dL (ref 1.7–2.4)

## 2020-03-24 MED ORDER — SENNOSIDES-DOCUSATE SODIUM 8.6-50 MG PO TABS
1.0000 | ORAL_TABLET | Freq: Every day | ORAL | Status: DC
  Administered 2020-03-25: 1 via ORAL
  Filled 2020-03-24: qty 1

## 2020-03-24 MED ORDER — SENNOSIDES-DOCUSATE SODIUM 8.6-50 MG PO TABS: 1.0000 | ORAL_TABLET | Freq: Two times a day (BID) | ORAL | Status: DC

## 2020-03-24 MED ORDER — LOSARTAN POTASSIUM 25 MG PO TABS
25.0000 mg | ORAL_TABLET | Freq: Every day | ORAL | Status: DC
  Administered 2020-03-24 – 2020-03-26 (×3): 25 mg via ORAL
  Filled 2020-03-24 (×3): qty 1

## 2020-03-24 NOTE — Progress Notes (Signed)
PROGRESS NOTE    Douglas Peterson.  OTL:[id number] DOB: 1951/03/22 DOA: 03/17/2020 PCP: [name], MD   Brief Narrative:  Douglas Peterson is a 69 y.o. M with hx of alcohol use disorder, HTN, TBI with right-sided hemiparesis, recent admission for intoxicating complicated by refeeding syndrome and atrial fibrillation who presents with a few days progressive lower abdominal pain, urge to defecate and constipation, now severe malaise.  Patient for started to notice constipation and pain in his rectum a few days ago, as if he had the urge to poop but could not.  This progressively got worse and he started to feel generalized malaise so he called 911 today.  EMS found him in bed, covered in urine, surrounded by beer cans and brought him to the ER.  In the ER afebrile, heart rate 105, respirations 24, blood pressure normal, pulse ox normal.  WBC 19 K, sodium 128, anion gap elevated, lactate 4.7.  Alcohol level is 266.  CT head and C-spine unremarkable, CT abdomen pelvis showed large volume of impacted stool in the colon, proctitis.  Chest x-ray clear, urinalysis without cells.  He was given 30 cc per cake IV fluids, and Zosyn was given empirically.  Assessment & Plan: Sepsis due to proctitis. Severe dehydration. Presents with tachycardia leukocytosis tachypnea and elevated lactate. CT scan shows evidence of proctitis. No significant evidence of abscess or fluid collection. Patient with severe rectal pain. Cultures so far negative. Treated with IV ceftriaxone and IV Flagyl. Symptoms have so far improved. Currently patient completed antibiotic course.  Bowel impaction. Intractable nausea and vomiting Recurrent issue for the patient. Aggressive bowel regimen was initiated. On 03/22/2020 patient reported 4 episodes of vomiting. X-ray abdomen shows evidence of constipation. Currently we will initiate scheduled Reglan along with aggressive bowel regimen. Repeat x-ray on 03/23/2020 shows  improvement in constipation but patient continues to have symptomatic with nausea and vomiting. Continue with stool softener Patient was also given scheduled Reglan.  We will discontinue it on 03/24/2020. Now that the nausea is improving and no further vomiting.  We will advance the diet and monitor.  Alcohol abuse disorder. CIWA score improving. Continue thiamine and folic acid. Counseling provided on alcohol cessation. Outpatient resources provided.  Paroxysmal A. fib. Not on anticoagulation due to history of thrombocytopenia, ongoing alcohol use as well as recurrent falls. Currently rate controlled in fact actually had bradycardic events during the hospital stay. Patient was on atenolol currently transition to Norvasc due to bradycardic events.  Essential hypertension. Bradycardia. Blood pressure high but heart rate in 50s. Atenolol discontinued. Discontinue Norvasc with the concern for constipation. Patient has tolerated lisinopril in the past.  Will initiate losartan.  Hyponatremia. Likely from poor p.o. intake. Resolved. IV hydration resumed.  History of traumatic brain injury. Chronic spasticity. Continue home medication.  GERD. Continue PPI.  Severe protein calorie malnutrition. Body mass index is 20.42 kg/m.  Nutrition Problem: Severe Malnutrition Etiology: social / environmental circumstances(EtOH use, inadequate oral intake) Nutrition Interventions: Interventions: Refer to RD note for recommendations Continue supplements.  Disposition: Status is: INPATIENT  Remains inpatient appropriate: Patient with intractable nausea and vomiting secondary to reoccurrence of constipation requiring aggressive IV therapy. Patient lives independently at baseline, but is currently unable to sit up or walk, safe disposition to rehab is pending insurance authroization   Dispo: The patient is from: Home              Anticipated d/c is to: SNF  Anticipated  d/c date is: 1 day              Patient currently is medically not stable to d/c.  DVT prophylaxis: Lovenox Code Status: Full code Family Communication: None at bedside.  Antimicrobials:   Ceftriaxone/Flagyl 5/15>>  Culture data:   5/15 blood cultures x2-gram positive rods in 1/2, likely contaminant, speciation pending  Subjective: Patient does not have any vomiting for last 24 hours.  Continues to have bowel movements as well.  No fever no chills.  No abdominal pain.  Objective: Vitals:   03/23/20 1956 03/24/20 0413 03/24/20 0833 03/24/20 1140  BP: (!) 142/97 (!) 142/88 (!) 145/99 (!) 138/99  Pulse: 75 73 69 99  Resp: 20 20  18   Temp: 98.2 F (36.8 C) 98.8 F (37.1 C) 97.8 F (36.6 C) 98.3 F (36.8 C)  TempSrc: Oral Oral Oral Oral  SpO2: 94% 94% 95% 96%  Weight:  52.3 kg    Height:        Intake/Output Summary (Last 24 hours) at 03/24/2020 1449 Last data filed at 03/24/2020 1226 Gross per 24 hour  Intake --  Output 1350 ml  Net -1350 ml   Filed Weights   03/22/20 0344 03/23/20 0347 03/24/20 0413  Weight: 53.7 kg 50.9 kg 52.3 kg    Examination:  General: Appear in no distress, no Rash; Oral Mucosa Clear, moist. Cardiovascular: S1 and S2 Present, no Murmur, Respiratory: normal respiratory effort, Bilateral Air entry present and no Crackles, no wheezes Abdomen: Bowel Sound present, Soft and  no tenderness, no hernia Extremities: no Pedal edema, no calf tenderness Neurology: alert and oriented to time, place, and person affect appropriate.  Chronic right-sided contracture   Data Reviewed: I have personally reviewed following labs and imaging studies:  CBC: Recent Labs  Lab 03/18/20 0320 03/19/20 0531 03/20/20 0503  WBC 9.3 4.6 4.6  HGB 12.8* 12.3* 13.1  HCT 36.1* 37.2* 38.0*  MCV 90.3 94.2 92.5  PLT 211 141* 643*   Basic Metabolic Panel: Recent Labs  Lab 03/19/20 0531 03/19/20 0531 03/20/20 0503 03/21/20 0546 03/22/20 0527 03/23/20 0548  03/24/20 0654  NA 140  --  140  --  138 135 132*  K 3.5  --  3.4*  --  3.4* 3.9 4.0  CL 105  --  107  --  104 101 99  CO2 27  --  28  --  27 27 22   GLUCOSE 98  --  120*  --  109* 91 79  BUN <5*  --  7*  --  8 <5* 5*  CREATININE <0.30*  --  0.48*  --  0.49* 0.45* 0.46*  CALCIUM 8.4*  --  8.8*  --  8.6* 8.9 8.9  MG 1.8   < > 1.7 1.5* 1.9 1.9 1.9  PHOS 2.5  --  3.7 3.4 3.9 3.7  --    < > = values in this interval not displayed.   GFR: Estimated Creatinine Clearance: 65.4 mL/min (A) (by C-G formula based on SCr of 0.46 mg/dL (L)). Liver Function Tests: Recent Labs  Lab 03/19/20 0531 03/20/20 0503  AST 23 29  ALT 20 21  ALKPHOS 73 83  BILITOT 0.8 0.6  PROT 6.4* 6.1*  ALBUMIN 3.5 3.3*   No results for input(s): LIPASE, AMYLASE in the last 168 hours. No results for input(s): AMMONIA in the last 168 hours. Coagulation Profile: No results for input(s): INR, PROTIME in the last 168 hours. Cardiac Enzymes: No results  for input(s): CKTOTAL, CKMB, CKMBINDEX, TROPONINI in the last 168 hours. BNP (last 3 results) No results for input(s): PROBNP in the last 8760 hours. HbA1C: No results for input(s): HGBA1C in the last 72 hours. CBG: No results for input(s): GLUCAP in the last 168 hours. Lipid Profile: No results for input(s): CHOL, HDL, LDLCALC, TRIG, CHOLHDL, LDLDIRECT in the last 72 hours. Thyroid Function Tests: No results for input(s): TSH, T4TOTAL, FREET4, T3FREE, THYROIDAB in the last 72 hours. Anemia Panel: No results for input(s): VITAMINB12, FOLATE, FERRITIN, TIBC, IRON, RETICCTPCT in the last 72 hours. Urine analysis:    Component Value Date/Time   COLORURINE YELLOW (A) 03/17/2020 1346   APPEARANCEUR HAZY (A) 03/17/2020 1346   LABSPEC 1.029 03/17/2020 1346   PHURINE 5.0 03/17/2020 1346   GLUCOSEU NEGATIVE 03/17/2020 1346   HGBUR NEGATIVE 03/17/2020 1346   BILIRUBINUR NEGATIVE 03/17/2020 1346   KETONESUR 5 (A) 03/17/2020 1346   PROTEINUR 100 (A) 03/17/2020 1346    NITRITE NEGATIVE 03/17/2020 1346   LEUKOCYTESUR NEGATIVE 03/17/2020 1346   Recent Results (from the past 240 hour(s))  SARS Coronavirus 2 by RT PCR (hospital order, performed in [place] hospital lab) Nasopharyngeal Nasopharyngeal Swab     Status: None   Collection Time: 03/17/20  2:30 PM   Specimen: Nasopharyngeal Swab  Result Value Ref Range Status   SARS Coronavirus 2 NEGATIVE NEGATIVE Final    Comment: (NOTE) SARS-CoV-2 target nucleic acids are NOT DETECTED. The SARS-CoV-2 RNA is generally detectable in upper and lower respiratory specimens during the acute phase of infection. The lowest concentration of SARS-CoV-2 viral copies this assay can detect is 250 copies / mL. A negative result does not preclude SARS-CoV-2 infection and should not be used as the sole basis for treatment or other patient management decisions.  A negative result may occur with improper specimen collection / handling, submission of specimen other than nasopharyngeal swab, presence of viral mutation(s) within the areas targeted by this assay, and inadequate number of viral copies (<250 copies / mL). A negative result must be combined with clinical observations, patient history, and epidemiological information. Fact Sheet for Patients:   StrictlyIdeas.no Fact Sheet for Healthcare Providers: BankingDealers.co.za This test is not yet approved or cleared  by the [place] FDA and has been authorized for detection and/or diagnosis of SARS-CoV-2 by FDA under an Emergency Use Authorization (EUA).  This EUA will remain in effect (meaning this test can be used) for the duration of the COVID-19 declaration under Section 564(b)(1) of the Act, 21 U.S.C. section 360bbb-3(b)(1), unless the authorization is terminated or revoked sooner. Performed at [facility], [address] [place], [place] [address]   CULTURE, BLOOD (ROUTINE X 2) w Reflex to ID Panel      Status: Abnormal   Collection Time: 03/17/20  2:30 PM   Specimen: BLOOD  Result Value Ref Range Status   Specimen Description   Final    BLOOD BLOOD LEFT FOREARM Performed at [facility], [address], [place], [place] [address]    Special Requests   Final    BOTTLES DRAWN AEROBIC AND ANAEROBIC Blood Culture adequate volume Performed at [facility], [address], [place], [place] [address]    Culture  Setup Time   Final    GRAM POSITIVE RODS AEROBIC BOTTLE ONLY CRITICAL RESULT CALLED TO, READ BACK BY AND VERIFIED WITH: [place] AT 0508 ON 03/19/20 RWW Performed at [facility], [address] [place], [place] [address]  Culture (A)  Final    DIPHTHEROIDS(CORYNEBACTERIUM SPECIES) Standardized susceptibility testing for this organism is not available. Performed at [facility], [place] [address] [place], [place] [address]    Report Status 03/21/2020 FINAL  Final  CULTURE, BLOOD (ROUTINE X 2) w Reflex to ID Panel     Status: None   Collection Time: 03/18/20  3:22 AM   Specimen: BLOOD  Result Value Ref Range Status   Specimen Description BLOOD BLOOD LEFT WRIST  Final   Special Requests   Final    BOTTLES DRAWN AEROBIC AND ANAEROBIC Blood Culture results may not be optimal due to an excessive volume of blood received in culture bottles   Culture   Final    NO GROWTH 5 DAYS Performed at [facility], [address], [place], [place] [address]    Report Status 03/23/2020 FINAL  Final  SARS CORONAVIRUS 2 (TAT 6-24 HRS) Nasopharyngeal Nasopharyngeal Swab     Status: None   Collection Time: 03/20/20 10:28 AM   Specimen: Nasopharyngeal Swab  Result Value Ref Range Status   SARS Coronavirus 2 NEGATIVE NEGATIVE Final    Comment: (NOTE) SARS-CoV-2 target nucleic acids are NOT DETECTED. The SARS-CoV-2 RNA is generally detectable in upper and lower respiratory specimens during the acute phase of infection. Negative results do not  preclude SARS-CoV-2 infection, do not rule out co-infections with other pathogens, and should not be used as the sole basis for treatment or other patient management decisions. Negative results must be combined with clinical observations, patient history, and epidemiological information. The expected result is Negative. Fact Sheet for Patients: SugarRoll.be Fact Sheet for Healthcare Providers: https://www.woods-mathews.com/ This test is not yet approved or cleared by the [place] FDA and  has been authorized for detection and/or diagnosis of SARS-CoV-2 by FDA under an Emergency Use Authorization (EUA). This EUA will remain  in effect (meaning this test can be used) for the duration of the COVID-19 declaration under Section 56 4(b)(1) of the Act, 21 U.S.C. section 360bbb-3(b)(1), unless the authorization is terminated or revoked sooner. Performed at [facility], [place] [name], [place], [place] [address]          Radiology Studies: DG Abd Portable 1V  Result Date: 03/23/2020 CLINICAL DATA:  Abdominal pain with vomiting EXAM: PORTABLE ABDOMEN - 1 VIEW COMPARISON:  Mar 22, 2020 FINDINGS: There is no bowel dilatation or air-fluid level to suggest bowel obstruction. No free air evident on this supine examination. There is postoperative change in the right abdomen. [name] status post kyphoplasty at L2. IMPRESSION: No bowel obstruction or free air evident. Electronically Signed   By: Lowella Grip III M.D.   On: 03/23/2020 08:54        Scheduled Meds: . baclofen  10 mg Oral TID  . enoxaparin (LOVENOX) injection  40 mg Subcutaneous Q24H  . feeding supplement (ENSURE ENLIVE)  237 mL Oral BID BM  . folic acid  1 mg Oral Daily  . hydrocortisone   Rectal BID  . losartan  25 mg Oral Daily  . multivitamin with minerals  1 tablet Oral Daily  . pantoprazole  40 mg Oral Daily  . [START ON 03/25/2020] senna-docusate  1 tablet Oral  QHS  . thiamine  100 mg Oral Daily   Or  . thiamine  100 mg Intravenous Daily   Continuous Infusions: . sodium chloride 250 mL (03/22/20 0433)     LOS: 6 days    Time spent: 35  minutes    Berle Mull, MD [facility] 03/24/2020, 2:49 PM     Please page though [place] or Epic secure chat:  For [employer], Adult nurse

## 2020-03-24 NOTE — Progress Notes (Signed)
Patient ambulated 3 times around nurses station with a cane with stand by assist. Patient tolerated well.

## 2020-03-24 NOTE — Plan of Care (Signed)
  Problem: Education: Goal: Knowledge of General Education information will improve Description: Including pain rating scale, medication(s)/side effects and non-pharmacologic comfort measures Outcome: Progressing   Problem: Clinical Measurements: Goal: Will remain free from infection Outcome: Progressing   Problem: Nutrition: Goal: Adequate nutrition will be maintained Outcome: Progressing   Problem: Safety: Goal: Ability to remain free from injury will improve Outcome: Progressing

## 2020-03-25 LAB — BASIC METABOLIC PANEL
Anion gap: 9 (ref 5–15)
BUN: 17 mg/dL (ref 8–23)
CO2: 27 mmol/L (ref 22–32)
Calcium: 9.6 mg/dL (ref 8.9–10.3)
Chloride: 100 mmol/L (ref 98–111)
Creatinine, Ser: 0.42 mg/dL — ABNORMAL LOW (ref 0.61–1.24)
GFR calc Af Amer: >60 mL/min (ref >60)
GFR calc non Af Amer: >60 mL/min (ref >60)
Glucose, Bld: 96 mg/dL (ref 70–99)
Potassium: 4 mmol/L (ref 3.5–5.1)
Sodium: 136 mmol/L (ref 135–145)

## 2020-03-25 LAB — CBC
HCT: 45.5 % (ref 39.0–52.0)
Hemoglobin: 15.6 g/dL (ref 13.0–17.0)
MCH: 31.5 pg (ref 26.0–34.0)
MCHC: 34.3 g/dL (ref 30.0–36.0)
MCV: 91.9 fL (ref 80.0–100.0)
Platelets: 156 10*3/uL (ref 150–400)
RBC: 4.95 MIL/uL (ref 4.22–5.81)
RDW: 13 % (ref 11.5–15.5)
WBC: 8.6 10*3/uL (ref 4.0–10.5)
nRBC: 0 % (ref 0.0–0.2)

## 2020-03-25 LAB — MAGNESIUM: Magnesium: 2 mg/dL (ref 1.7–2.4)

## 2020-03-25 MED ORDER — ZOLPIDEM TARTRATE 5 MG PO TABS: 5.0000 mg | ORAL_TABLET | Freq: Every evening | ORAL | Status: DC | PRN

## 2020-03-25 NOTE — Progress Notes (Signed)
PROGRESS NOTE    Douglas Peterson.  MNO:[id number] DOB: 1951-05-29 DOA: 03/17/2020 PCP: [name], MD   Brief Narrative:  Douglas Peterson is a 69 y.o. M with hx of alcohol use disorder, HTN, TBI with right-sided hemiparesis, recent admission for intoxicating complicated by refeeding syndrome and atrial fibrillation who presents with a few days progressive lower abdominal pain, urge to defecate and constipation, now severe malaise.  Patient for started to notice constipation and pain in his rectum a few days ago, as if he had the urge to poop but could not.  This progressively got worse and he started to feel generalized malaise so he called 911 today.  EMS found him in bed, covered in urine, surrounded by beer cans and brought him to the ER.  In the ER afebrile, heart rate 105, respirations 24, blood pressure normal, pulse ox normal.  WBC 19 K, sodium 128, anion gap elevated, lactate 4.7.  Alcohol level is 266.  CT head and C-spine unremarkable, CT abdomen pelvis showed large volume of impacted stool in the colon, proctitis.  Chest x-ray clear, urinalysis without cells.  He was given 30 cc per cake IV fluids, and Zosyn was given empirically.  Assessment & Plan: Sepsis due to proctitis. Severe dehydration. Presents with tachycardia leukocytosis tachypnea and elevated lactate. CT scan shows evidence of proctitis. No significant evidence of abscess or fluid collection. Patient with severe rectal pain. Cultures so far negative. Treated with IV ceftriaxone and IV Flagyl. Symptoms have so far improved. Currently patient completed antibiotic course.  Bowel impaction. Intractable nausea and vomiting Recurrent issue for the patient. Aggressive bowel regimen was initiated. On 03/22/2020 patient reported 4 episodes of vomiting. X-ray abdomen shows evidence of constipation. Currently we will initiate scheduled Reglan along with aggressive bowel regimen. Repeat x-ray on 03/23/2020 shows  improvement in constipation but patient continues to have symptomatic with nausea and vomiting. Continue with stool softener Patient was also given scheduled Reglan.  We discontinue it on 03/24/2020. Now that the nausea is improving and no further vomiting.  We will advance the diet and monitor.  Alcohol abuse disorder. CIWA score improving. Continue thiamine and folic acid. Counseling provided on alcohol cessation. Outpatient resources provided.  Paroxysmal A. fib. Not on anticoagulation due to history of thrombocytopenia, ongoing alcohol use as well as recurrent falls. Currently rate controlled in fact actually had bradycardic events during the hospital stay. Patient was on atenolol currently transition to Norvasc due to bradycardic events.  Essential hypertension. Bradycardia. Blood pressure high but heart rate in 50s. Atenolol discontinued. Discontinue Norvasc with the concern for constipation. Patient has tolerated lisinopril in the past.  Will initiate losartan.  Hyponatremia. Likely from poor p.o. intake. Resolved. IV hydration resumed.  History of traumatic brain injury. Chronic spasticity. Continue home medication.  GERD. Continue PPI.  Severe protein calorie malnutrition. Body mass index is 20.19 kg/m.  Nutrition Problem: Severe Malnutrition Etiology: social / environmental circumstances(EtOH use, inadequate oral intake) Nutrition Interventions: Interventions: Refer to RD note for recommendations Continue supplements.  Disposition: Status is: INPATIENT  Remains inpatient appropriate: Patient had recurrent constipation causing intractable nausea and vomiting unable to tolerate anything p.o. This happened twice during this admission itself. Patient remains at risk for further worsening. At present needs to monitor stability before discharging. Likely will discharge home although PT evaluation currently pending.  Dispo: The patient is from: Home               Anticipated d/c is to: SNF versus home  with home health pending PT evaluation              Anticipated d/c date is: 1 day              Patient currently is medically stable for DC  DVT prophylaxis: Lovenox Code Status: Full code Family Communication: None at bedside.  Antimicrobials:   Ceftriaxone/Flagyl 5/15>>  Culture data:   5/15 blood cultures x2-gram positive rods in 1/2, likely contaminant, speciation pending  Subjective: Nausea improved.  No further vomiting since yesterday.  Having bowel movements.  Had some runny bowel movements yesterday.  No abdominal pain.  Oral intake improving.  No fever no chills.  Strength improving as well.  Objective: Vitals:   03/24/20 2001 03/25/20 0455 03/25/20 0744 03/25/20 1150  BP: (!) 137/99 127/84 121/87 133/90  Pulse: 93 82 78 (!) 110  Resp:   20 18  Temp:  98.1 F (36.7 C) 98 F (36.7 C)   TempSrc:  Oral    SpO2: 94% 94% 100% 97%  Weight:  51.7 kg    Height:        Intake/Output Summary (Last 24 hours) at 03/25/2020 1844 Last data filed at 03/25/2020 1350 Gross per 24 hour  Intake 240 ml  Output 900 ml  Net -660 ml   Filed Weights   03/23/20 0347 03/24/20 0413 03/25/20 0455  Weight: 50.9 kg 52.3 kg 51.7 kg    Examination:  General: Appear in no distress, no Rash; Oral Mucosa Clear, moist. Cardiovascular: S1 and S2 Present, no Murmur, Respiratory: normal respiratory effort, Bilateral Air entry present and no Crackles, no wheezes Abdomen: Bowel Sound present, Soft and  no tenderness, no hernia Extremities: no Pedal edema, no calf tenderness Neurology: alert and oriented to time, place, and person affect appropriate.  Chronic right-sided contracture   Data Reviewed: I have personally reviewed following labs and imaging studies:  CBC: Recent Labs  Lab 03/19/20 0531 03/20/20 0503 03/25/20 0623  WBC 4.6 4.6 8.6  HGB 12.3* 13.1 15.6  HCT 37.2* 38.0* 45.5  MCV 94.2 92.5 91.9  PLT 141* 143* 973   Basic  Metabolic Panel: Recent Labs  Lab 03/19/20 0531 03/19/20 0531 03/20/20 0503 03/20/20 0503 03/21/20 0546 03/22/20 0527 03/23/20 0548 03/24/20 0654 03/25/20 0623  NA 140   < > 140  --   --  138 135 132* 136  K 3.5   < > 3.4*  --   --  3.4* 3.9 4.0 4.0  CL 105   < > 107  --   --  104 101 99 100  CO2 27   < > 28  --   --  27 27 22 27   GLUCOSE 98   < > 120*  --   --  109* 91 79 96  BUN <5*   < > 7*  --   --  8 <5* 5* 17  CREATININE <0.30*   < > 0.48*  --   --  0.49* 0.45* 0.46* 0.42*  CALCIUM 8.4*   < > 8.8*  --   --  8.6* 8.9 8.9 9.6  MG 1.8   < > 1.7   < > 1.5* 1.9 1.9 1.9 2.0  PHOS 2.5  --  3.7  --  3.4 3.9 3.7  --   --    < > = values in this interval not displayed.   GFR: Estimated Creatinine Clearance: 64.6 mL/min (A) (by C-G formula based on SCr of 0.42 mg/dL (L)).  Liver Function Tests: Recent Labs  Lab 03/19/20 0531 03/20/20 0503  AST 23 29  ALT 20 21  ALKPHOS 73 83  BILITOT 0.8 0.6  PROT 6.4* 6.1*  ALBUMIN 3.5 3.3*   No results for input(s): LIPASE, AMYLASE in the last 168 hours. No results for input(s): AMMONIA in the last 168 hours. Coagulation Profile: No results for input(s): INR, PROTIME in the last 168 hours. Cardiac Enzymes: No results for input(s): CKTOTAL, CKMB, CKMBINDEX, TROPONINI in the last 168 hours. BNP (last 3 results) No results for input(s): PROBNP in the last 8760 hours. HbA1C: No results for input(s): HGBA1C in the last 72 hours. CBG: No results for input(s): GLUCAP in the last 168 hours. Lipid Profile: No results for input(s): CHOL, HDL, LDLCALC, TRIG, CHOLHDL, LDLDIRECT in the last 72 hours. Thyroid Function Tests: No results for input(s): TSH, T4TOTAL, FREET4, T3FREE, THYROIDAB in the last 72 hours. Anemia Panel: No results for input(s): VITAMINB12, FOLATE, FERRITIN, TIBC, IRON, RETICCTPCT in the last 72 hours. Urine analysis:    Component Value Date/Time   COLORURINE YELLOW (A) 03/17/2020 1346   APPEARANCEUR HAZY (A) 03/17/2020  1346   LABSPEC 1.029 03/17/2020 1346   PHURINE 5.0 03/17/2020 1346   GLUCOSEU NEGATIVE 03/17/2020 1346   HGBUR NEGATIVE 03/17/2020 1346   BILIRUBINUR NEGATIVE 03/17/2020 1346   KETONESUR 5 (A) 03/17/2020 1346   PROTEINUR 100 (A) 03/17/2020 1346   NITRITE NEGATIVE 03/17/2020 1346   LEUKOCYTESUR NEGATIVE 03/17/2020 1346   Recent Results (from the past 240 hour(s))  SARS Coronavirus 2 by RT PCR (hospital order, performed in [place] hospital lab) Nasopharyngeal Nasopharyngeal Swab     Status: None   Collection Time: 03/17/20  2:30 PM   Specimen: Nasopharyngeal Swab  Result Value Ref Range Status   SARS Coronavirus 2 NEGATIVE NEGATIVE Final    Comment: (NOTE) SARS-CoV-2 target nucleic acids are NOT DETECTED. The SARS-CoV-2 RNA is generally detectable in upper and lower respiratory specimens during the acute phase of infection. The lowest concentration of SARS-CoV-2 viral copies this assay can detect is 250 copies / mL. A negative result does not preclude SARS-CoV-2 infection and should not be used as the sole basis for treatment or other patient management decisions.  A negative result may occur with improper specimen collection / handling, submission of specimen other than nasopharyngeal swab, presence of viral mutation(s) within the areas targeted by this assay, and inadequate number of viral copies (<250 copies / mL). A negative result must be combined with clinical observations, patient history, and epidemiological information. Fact Sheet for Patients:   StrictlyIdeas.no Fact Sheet for Healthcare Providers: BankingDealers.co.za This test is not yet approved or cleared  by the [place] FDA and has been authorized for detection and/or diagnosis of SARS-CoV-2 by FDA under an Emergency Use Authorization (EUA).  This EUA will remain in effect (meaning this test can be used) for the duration of the COVID-19 declaration under  Section 564(b)(1) of the Act, 21 U.S.C. section 360bbb-3(b)(1), unless the authorization is terminated or revoked sooner. Performed at [facility], [address] [place], [place] [address]   CULTURE, BLOOD (ROUTINE X 2) w Reflex to ID Panel     Status: Abnormal   Collection Time: 03/17/20  2:30 PM   Specimen: BLOOD  Result Value Ref Range Status   Specimen Description   Final    BLOOD BLOOD LEFT FOREARM Performed at [facility], [address] [place], [place] [address]    Special Requests  Final    BOTTLES DRAWN AEROBIC AND ANAEROBIC Blood Culture adequate volume Performed at [facility], [address] [place], [place] [address]    Culture  Setup Time   Final    GRAM POSITIVE RODS AEROBIC BOTTLE ONLY CRITICAL RESULT CALLED TO, READ BACK BY AND VERIFIED WITH: Chinchilla AT 0092 ON 03/19/20 RWW Performed at [facility], [address] [place], [place] [address]    Culture (A)  Final    DIPHTHEROIDS(CORYNEBACTERIUM SPECIES) Standardized susceptibility testing for this organism is not available. Performed at [facility], [place] [address] [place], [place] [address]    Report Status 03/21/2020 FINAL  Final  CULTURE, BLOOD (ROUTINE X 2) w Reflex to ID Panel     Status: None   Collection Time: 03/18/20  3:22 AM   Specimen: BLOOD  Result Value Ref Range Status   Specimen Description BLOOD BLOOD LEFT WRIST  Final   Special Requests   Final    BOTTLES DRAWN AEROBIC AND ANAEROBIC Blood Culture results may not be optimal due to an excessive volume of blood received in culture bottles   Culture   Final    NO GROWTH 5 DAYS Performed at [facility], [address], [place], [place] [address]    Report Status 03/23/2020 FINAL  Final  SARS CORONAVIRUS 2 (TAT 6-24 HRS) Nasopharyngeal Nasopharyngeal Swab     Status: None   Collection Time: 03/20/20 10:28 AM   Specimen: Nasopharyngeal Swab  Result Value Ref Range Status    SARS Coronavirus 2 NEGATIVE NEGATIVE Final    Comment: (NOTE) SARS-CoV-2 target nucleic acids are NOT DETECTED. The SARS-CoV-2 RNA is generally detectable in upper and lower respiratory specimens during the acute phase of infection. Negative results do not preclude SARS-CoV-2 infection, do not rule out co-infections with other pathogens, and should not be used as the sole basis for treatment or other patient management decisions. Negative results must be combined with clinical observations, patient history, and epidemiological information. The expected result is Negative. Fact Sheet for Patients: SugarRoll.be Fact Sheet for Healthcare Providers: https://www.woods-mathews.com/ This test is not yet approved or cleared by the [place] FDA and  has been authorized for detection and/or diagnosis of SARS-CoV-2 by FDA under an Emergency Use Authorization (EUA). This EUA will remain  in effect (meaning this test can be used) for the duration of the COVID-19 declaration under Section 56 4(b)(1) of the Act, 21 U.S.C. section 360bbb-3(b)(1), unless the authorization is terminated or revoked sooner. Performed at [facility], [place] [address], [place], [place] [address]          Radiology Studies: No results found.      Scheduled Meds: . baclofen  10 mg Oral TID  . enoxaparin (LOVENOX) injection  40 mg Subcutaneous Q24H  . feeding supplement (ENSURE ENLIVE)  237 mL Oral BID BM  . folic acid  1 mg Oral Daily  . hydrocortisone   Rectal BID  . losartan  25 mg Oral Daily  . multivitamin with minerals  1 tablet Oral Daily  . pantoprazole  40 mg Oral Daily  . senna-docusate  1 tablet Oral QHS  . thiamine  100 mg Oral Daily   Or  . thiamine  100 mg Intravenous Daily   Continuous Infusions: . sodium chloride 250 mL (03/22/20 0433)     LOS: 7 days    Time spent: 35 minutes    Berle Mull, MD [facility] 03/25/2020,  6:44  PM     Please page though [place] or Epic secure chat:  For [employer], Adult nurse

## 2020-03-25 NOTE — Progress Notes (Signed)
Patient ambulated around nurses station 3 times with stand by assistance with cane. Patient tolerated well.

## 2020-03-25 NOTE — Plan of Care (Signed)

## 2020-03-26 LAB — CBC
HCT: 42.1 % (ref 39.0–52.0)
Hemoglobin: 14.7 g/dL (ref 13.0–17.0)
MCH: 31.7 pg (ref 26.0–34.0)
MCHC: 34.9 g/dL (ref 30.0–36.0)
MCV: 90.9 fL (ref 80.0–100.0)
Platelets: 171 10*3/uL (ref 150–400)
RBC: 4.63 MIL/uL (ref 4.22–5.81)
RDW: 13.2 % (ref 11.5–15.5)
WBC: 8.3 10*3/uL (ref 4.0–10.5)
nRBC: 0 % (ref 0.0–0.2)

## 2020-03-26 LAB — BASIC METABOLIC PANEL
Anion gap: 9 (ref 5–15)
BUN: 20 mg/dL (ref 8–23)
CO2: 25 mmol/L (ref 22–32)
Calcium: 9 mg/dL (ref 8.9–10.3)
Chloride: 101 mmol/L (ref 98–111)
Creatinine, Ser: 0.5 mg/dL — ABNORMAL LOW (ref 0.61–1.24)
GFR calc Af Amer: >60 mL/min (ref >60)
GFR calc non Af Amer: >60 mL/min (ref >60)
Glucose, Bld: 89 mg/dL (ref 70–99)
Potassium: 4.1 mmol/L (ref 3.5–5.1)
Sodium: 135 mmol/L (ref 135–145)

## 2020-03-26 LAB — MAGNESIUM: Magnesium: 1.9 mg/dL (ref 1.7–2.4)

## 2020-03-26 MED ORDER — LOSARTAN POTASSIUM 25 MG PO TABS: 25.0000 mg | ORAL_TABLET | Freq: Every day | ORAL | 0 refills | Status: DC

## 2020-03-26 MED ORDER — DOCUSATE SODIUM 100 MG PO CAPS
100.0000 mg | ORAL_CAPSULE | Freq: Two times a day (BID) | ORAL | 0 refills | Status: DC
Start: 2020-03-26 — End: 2020-08-05

## 2020-03-26 NOTE — TOC Transition Note (Signed)
Transition of Care [facility]) - CM/SW Discharge Note   Patient Details  Name: Douglas Peterson. MRN: [id number] Date of Birth: 09-21-1951  Transition of Care [facility]) CM/SW Contact:  Su Hilt, RN Phone Number: 03/26/2020, 10:28 AM   Clinical Narrative:    Met with patient to discuss DC plan and needs, he lives at home alone and has RW and a cane at home, he drives typically when he is at his baseline. He will need a taxi voucher to go home. I called [facility] and requested that they accept the patient for [facility] services, awaiting a call back.    Final next level of care: [place] Barriers to Discharge: Barriers Resolved   Patient Goals and CMS Choice Patient states their goals for this hospitalization and ongoing recovery are:: to go home CMS Medicare.gov Compare Post Acute Care list provided to:: Patient Choice offered to / list presented to : Patient  Discharge Placement                       Discharge Plan and Services   Discharge Planning Services: CM Consult              DME Agency: NA       HH Arranged: PT [place] Agency: [place] ([place]) Date HH Agency Contacted: 03/26/20 Time [place]: 1027 Representative spoke with at [place]: [place] ([place]) Interventions     Readmission Risk Interventions Readmission Risk Prevention Plan 12/13/2019 12/12/2019  Transportation Screening Complete Complete  PCP or Specialist Appt within 3-5 Days (No Data) -  HRI or [place] (No Data) -  Palliative Care Screening Complete Complete  Medication Review (RN Care Manager) Complete Complete  Some recent data might be hidden

## 2020-03-26 NOTE — TOC Progression Note (Signed)
Transition of Care (TOC) - Progression Note    Patient Details  Name: Jamael Hoffmann. MRN: [id number] Date of Birth: August 28, 1951  Transition of Care [facility]) CM/SW Contact  Su Hilt, RN Phone Number: 03/26/2020, 9:12 AM  Clinical Narrative:     The patient was not medically stable on 5/21 and therefore the auth was not appealed, I called Tammy at [facility] to restart auth if allowable, Awating a call back from tammy  Expected Discharge Plan: [place] Barriers to Discharge: Barriers Resolved  Expected Discharge Plan and Services Expected Discharge Plan: [place]   Discharge Planning Services: CM Consult   Living arrangements for the past 2 months: Single Family Home Expected Discharge Date: 03/26/20                 DME Agency: NA                   Social Determinants of Health (SDOH) Interventions    Readmission Risk Interventions Readmission Risk Prevention Plan 12/13/2019 12/12/2019  Transportation Screening Complete Complete  PCP or Specialist Appt within 3-5 Days (No Data) -  HRI or [place] (No Data) -  Palliative Care Screening Complete Complete  Medication Review (RN Care Manager) Complete Complete  Some recent data might be hidden

## 2020-03-26 NOTE — TOC Progression Note (Signed)
Transition of Care (TOC) - Progression Note    Patient Details  Name: [name]. MRN: [id number] Date of Birth: 09/19/1951  Transition of Care [facility]) CM/SW Contact  Su Hilt, RN Phone Number: 03/26/2020, 9:38 AM  Clinical Narrative:    Patient will DC home since has greatly approved, No auth needed   Expected Discharge Plan: [place] Barriers to Discharge: Barriers Resolved  Expected Discharge Plan and Services Expected Discharge Plan: [place]   Discharge Planning Services: CM Consult   Living arrangements for the past 2 months: Single Family Home Expected Discharge Date: 03/26/20                 DME Agency: NA                   Social Determinants of Health (SDOH) Interventions    Readmission Risk Interventions Readmission Risk Prevention Plan 12/13/2019 12/12/2019  Transportation Screening Complete Complete  PCP or Specialist Appt within 3-5 Days (No Data) -  HRI or [place] (No Data) -  Palliative Care Screening Complete Complete  Medication Review (RN Care Manager) Complete Complete  Some recent data might be hidden

## 2020-03-26 NOTE — Care Management Important Message (Signed)
Important Message  Patient Details  Name: Douglas Peterson. MRN: [id number] Date of Birth: [birth year]   Medicare Important Message Given:  Yes     Shelbie Ammons, RN 03/26/2020, 1:37 PM

## 2020-04-06 ENCOUNTER — Inpatient Hospital Stay
Admission: EM | Admit: 2020-04-06 | Discharge: 2020-04-19 | DRG: 564 | Disposition: A | Discharge status: Home Health | Payer: Medicare HMO | Attending: Internal Medicine | Admitting: Internal Medicine

## 2020-04-06 ENCOUNTER — Encounter: Payer: Self-pay | Admitting: Emergency Medicine

## 2020-04-06 ENCOUNTER — Other Ambulatory Visit: Payer: Self-pay

## 2020-04-06 ENCOUNTER — Emergency Department: Payer: Medicare HMO

## 2020-04-06 DIAGNOSIS — E43 Unspecified severe protein-calorie malnutrition: Secondary | ICD-10-CM | POA: Diagnosis present

## 2020-04-06 DIAGNOSIS — M6282 Rhabdomyolysis: Secondary | ICD-10-CM | POA: Diagnosis present

## 2020-04-06 DIAGNOSIS — K219 Gastro-esophageal reflux disease without esophagitis: Secondary | ICD-10-CM | POA: Diagnosis present

## 2020-04-06 DIAGNOSIS — M25512 Pain in left shoulder: Secondary | ICD-10-CM | POA: Diagnosis present

## 2020-04-06 DIAGNOSIS — Z87820 Personal history of traumatic brain injury: Secondary | ICD-10-CM

## 2020-04-06 DIAGNOSIS — Z9181 History of falling: Secondary | ICD-10-CM

## 2020-04-06 DIAGNOSIS — R7989 Other specified abnormal findings of blood chemistry: Secondary | ICD-10-CM | POA: Diagnosis present

## 2020-04-06 DIAGNOSIS — E860 Dehydration: Secondary | ICD-10-CM | POA: Diagnosis present

## 2020-04-06 DIAGNOSIS — R296 Repeated falls: Secondary | ICD-10-CM | POA: Diagnosis present

## 2020-04-06 DIAGNOSIS — Z6820 Body mass index (BMI) 20.0-20.9, adult: Secondary | ICD-10-CM

## 2020-04-06 DIAGNOSIS — G839 Paralytic syndrome, unspecified: Secondary | ICD-10-CM | POA: Diagnosis present

## 2020-04-06 DIAGNOSIS — M25551 Pain in right hip: Secondary | ICD-10-CM | POA: Diagnosis present

## 2020-04-06 DIAGNOSIS — Z20822 Contact with and (suspected) exposure to covid-19: Secondary | ICD-10-CM | POA: Diagnosis present

## 2020-04-06 DIAGNOSIS — W010XXA Fall on same level from slipping, tripping and stumbling without subsequent striking against object, initial encounter: Secondary | ICD-10-CM | POA: Diagnosis present

## 2020-04-06 DIAGNOSIS — T796XXA Traumatic ischemia of muscle, initial encounter: Principal | ICD-10-CM | POA: Diagnosis present

## 2020-04-06 DIAGNOSIS — Y92009 Unspecified place in unspecified non-institutional (private) residence as the place of occurrence of the external cause: Secondary | ICD-10-CM

## 2020-04-06 DIAGNOSIS — F1010 Alcohol abuse, uncomplicated: Secondary | ICD-10-CM | POA: Diagnosis not present

## 2020-04-06 DIAGNOSIS — M25552 Pain in left hip: Secondary | ICD-10-CM | POA: Diagnosis present

## 2020-04-06 DIAGNOSIS — I10 Essential (primary) hypertension: Secondary | ICD-10-CM | POA: Diagnosis present

## 2020-04-06 DIAGNOSIS — E871 Hypo-osmolality and hyponatremia: Secondary | ICD-10-CM | POA: Diagnosis present

## 2020-04-06 DIAGNOSIS — F329 Major depressive disorder, single episode, unspecified: Secondary | ICD-10-CM | POA: Diagnosis present

## 2020-04-06 DIAGNOSIS — Z9049 Acquired absence of other specified parts of digestive tract: Secondary | ICD-10-CM

## 2020-04-06 DIAGNOSIS — D696 Thrombocytopenia, unspecified: Secondary | ICD-10-CM | POA: Diagnosis present

## 2020-04-06 DIAGNOSIS — Y900 Blood alcohol level of less than 20 mg/100 ml: Secondary | ICD-10-CM | POA: Diagnosis present

## 2020-04-06 DIAGNOSIS — K5900 Constipation, unspecified: Secondary | ICD-10-CM | POA: Diagnosis not present

## 2020-04-06 DIAGNOSIS — K7030 Alcoholic cirrhosis of liver without ascites: Secondary | ICD-10-CM | POA: Diagnosis present

## 2020-04-06 DIAGNOSIS — Z79899 Other long term (current) drug therapy: Secondary | ICD-10-CM

## 2020-04-06 DIAGNOSIS — R945 Abnormal results of liver function studies: Secondary | ICD-10-CM

## 2020-04-06 DIAGNOSIS — E876 Hypokalemia: Secondary | ICD-10-CM | POA: Diagnosis present

## 2020-04-06 DIAGNOSIS — F32A Depression, unspecified: Secondary | ICD-10-CM | POA: Diagnosis present

## 2020-04-06 DIAGNOSIS — Z87891 Personal history of nicotine dependence: Secondary | ICD-10-CM

## 2020-04-06 DIAGNOSIS — I480 Paroxysmal atrial fibrillation: Secondary | ICD-10-CM | POA: Diagnosis present

## 2020-04-06 DIAGNOSIS — W19XXXA Unspecified fall, initial encounter: Secondary | ICD-10-CM

## 2020-04-06 LAB — CBC WITH DIFFERENTIAL/PLATELET
Abs Immature Granulocytes: 0.04 10*3/uL (ref 0.00–0.07)
Basophils Absolute: 0.1 10*3/uL (ref 0.0–0.1)
Basophils Relative: 1 %
Eosinophils Absolute: 0.7 10*3/uL — ABNORMAL HIGH (ref 0.0–0.5)
Eosinophils Relative: 6 %
HCT: 50 % (ref 39.0–52.0)
Hemoglobin: 17.8 g/dL — ABNORMAL HIGH (ref 13.0–17.0)
Immature Granulocytes: 0 %
Lymphocytes Relative: 30 %
Lymphs Abs: 3.4 10*3/uL (ref 0.7–4.0)
MCH: 31.5 pg (ref 26.0–34.0)
MCHC: 35.6 g/dL (ref 30.0–36.0)
MCV: 88.5 fL (ref 80.0–100.0)
Monocytes Absolute: 0.6 10*3/uL (ref 0.1–1.0)
Monocytes Relative: 5 %
Neutro Abs: 6.7 10*3/uL (ref 1.7–7.7)
Neutrophils Relative %: 58 %
Platelets: 255 10*3/uL (ref 150–400)
RBC: 5.65 MIL/uL (ref 4.22–5.81)
RDW: 12.7 % (ref 11.5–15.5)
Smear Review: NORMAL
WBC: 11.5 10*3/uL — ABNORMAL HIGH (ref 4.0–10.5)
nRBC: 0 % (ref 0.0–0.2)

## 2020-04-06 LAB — LACTIC ACID, PLASMA
Lactic Acid, Venous: 2.4 mmol/L (ref 0.5–1.9)
Lactic Acid, Venous: 1.5 mmol/L (ref 0.5–1.9)

## 2020-04-06 LAB — ETHANOL: Alcohol, Ethyl (B): <10 mg/dL (ref <10)

## 2020-04-06 LAB — COMPREHENSIVE METABOLIC PANEL
ALT: 73 U/L — ABNORMAL HIGH (ref 0–44)
AST: 185 U/L — ABNORMAL HIGH (ref 15–41)
Albumin: 3.9 g/dL (ref 3.5–5.0)
Alkaline Phosphatase: 66 U/L (ref 38–126)
Anion gap: 20 — ABNORMAL HIGH (ref 5–15)
BUN: 11 mg/dL (ref 8–23)
CO2: 18 mmol/L — ABNORMAL LOW (ref 22–32)
Calcium: 8.5 mg/dL — ABNORMAL LOW (ref 8.9–10.3)
Chloride: 95 mmol/L — ABNORMAL LOW (ref 98–111)
Creatinine, Ser: 0.63 mg/dL (ref 0.61–1.24)
GFR calc Af Amer: >60 mL/min (ref >60)
GFR calc non Af Amer: >60 mL/min (ref >60)
Glucose, Bld: 77 mg/dL (ref 70–99)
Potassium: 3.7 mmol/L (ref 3.5–5.1)
Sodium: 133 mmol/L — ABNORMAL LOW (ref 135–145)
Total Bilirubin: 3 mg/dL — ABNORMAL HIGH (ref 0.3–1.2)
Total Protein: 6.9 g/dL (ref 6.5–8.1)

## 2020-04-06 LAB — BASIC METABOLIC PANEL
Anion gap: 21 — ABNORMAL HIGH (ref 5–15)
BUN: 11 mg/dL (ref 8–23)
CO2: 22 mmol/L (ref 22–32)
Calcium: 9.5 mg/dL (ref 8.9–10.3)
Chloride: 90 mmol/L — ABNORMAL LOW (ref 98–111)
Creatinine, Ser: 0.66 mg/dL (ref 0.61–1.24)
GFR calc Af Amer: >60 mL/min (ref >60)
GFR calc non Af Amer: >60 mL/min (ref >60)
Glucose, Bld: 80 mg/dL (ref 70–99)
Potassium: 3.9 mmol/L (ref 3.5–5.1)
Sodium: 133 mmol/L — ABNORMAL LOW (ref 135–145)

## 2020-04-06 LAB — HEPATIC FUNCTION PANEL
ALT: 86 U/L — ABNORMAL HIGH (ref 0–44)
AST: 227 U/L — ABNORMAL HIGH (ref 15–41)
Albumin: 4.6 g/dL (ref 3.5–5.0)
Alkaline Phosphatase: 82 U/L (ref 38–126)
Bilirubin, Direct: 0.5 mg/dL — ABNORMAL HIGH (ref 0.0–0.2)
Indirect Bilirubin: 2.7 mg/dL — ABNORMAL HIGH (ref 0.3–0.9)
Total Bilirubin: 3.2 mg/dL — ABNORMAL HIGH (ref 0.3–1.2)
Total Protein: 8.4 g/dL — ABNORMAL HIGH (ref 6.5–8.1)

## 2020-04-06 LAB — TROPONIN I (HIGH SENSITIVITY)
Troponin I (High Sensitivity): 16 ng/L (ref <18)
Troponin I (High Sensitivity): 12 ng/L (ref <18)

## 2020-04-06 LAB — PHOSPHORUS: Phosphorus: 2.3 mg/dL — ABNORMAL LOW (ref 2.5–4.6)

## 2020-04-06 LAB — CK: Total CK: 5659 U/L — ABNORMAL HIGH (ref 49–397)

## 2020-04-06 LAB — SARS CORONAVIRUS 2 BY RT PCR (HOSPITAL ORDER, PERFORMED IN ~~LOC~~ HOSPITAL LAB): SARS Coronavirus 2: NEGATIVE

## 2020-04-06 MED ORDER — OXYCODONE HCL 5 MG PO TABS
5.0000 mg | ORAL_TABLET | Freq: Four times a day (QID) | ORAL | Status: DC | PRN
  Administered 2020-04-06 – 2020-04-17 (×11): 5 mg via ORAL
  Filled 2020-04-06 (×11): qty 1

## 2020-04-06 MED ORDER — OMEPRAZOLE MAGNESIUM 20 MG PO TBEC: 20.0000 mg | DELAYED_RELEASE_TABLET | Freq: Every day | ORAL | Status: DC

## 2020-04-06 MED ORDER — ENOXAPARIN SODIUM 40 MG/0.4ML ~~LOC~~ SOLN
40.0000 mg | SUBCUTANEOUS | Status: DC
  Administered 2020-04-06 – 2020-04-18 (×12): 40 mg via SUBCUTANEOUS
  Filled 2020-04-06 (×12): qty 0.4

## 2020-04-06 MED ORDER — LORAZEPAM 1 MG PO TABS
1.0000 mg | ORAL_TABLET | ORAL | Status: AC | PRN
  Administered 2020-04-07: 18:00:00 2 mg via ORAL
  Filled 2020-04-06: qty 2

## 2020-04-06 MED ORDER — BACLOFEN 10 MG PO TABS
10.0000 mg | ORAL_TABLET | Freq: Three times a day (TID) | ORAL | Status: DC
  Administered 2020-04-06 – 2020-04-19 (×38): 10 mg via ORAL
  Filled 2020-04-06 (×40): qty 1

## 2020-04-06 MED ORDER — ATENOLOL 25 MG PO TABS
25.0000 mg | ORAL_TABLET | Freq: Two times a day (BID) | ORAL | Status: DC
  Administered 2020-04-06 – 2020-04-19 (×22): 25 mg via ORAL
  Filled 2020-04-06 (×27): qty 1

## 2020-04-06 MED ORDER — SODIUM CHLORIDE 0.9 % IV BOLUS
1000.0000 mL | Freq: Once | INTRAVENOUS | Status: AC
  Administered 2020-04-06: 1000 mL via INTRAVENOUS

## 2020-04-06 MED ORDER — LORAZEPAM 2 MG/ML IJ SOLN: 0.0000 mg | Freq: Two times a day (BID) | INTRAMUSCULAR | Status: AC

## 2020-04-06 MED ORDER — PAROXETINE HCL 30 MG PO TABS
30.0000 mg | ORAL_TABLET | Freq: Every day | ORAL | Status: DC
  Administered 2020-04-06 – 2020-04-19 (×14): 30 mg via ORAL
  Filled 2020-04-06 (×14): qty 1

## 2020-04-06 MED ORDER — DOXEPIN HCL 10 MG PO CAPS
10.0000 mg | ORAL_CAPSULE | Freq: Every day | ORAL | Status: DC
  Administered 2020-04-06 – 2020-04-18 (×13): 10 mg via ORAL
  Filled 2020-04-06 (×14): qty 1

## 2020-04-06 MED ORDER — SODIUM CHLORIDE 0.9 % IV SOLN: INTRAVENOUS | Status: DC

## 2020-04-06 MED ORDER — ONDANSETRON HCL 4 MG/2ML IJ SOLN
4.0000 mg | Freq: Three times a day (TID) | INTRAMUSCULAR | Status: DC | PRN
  Administered 2020-04-13 – 2020-04-18 (×2): 4 mg via INTRAVENOUS
  Filled 2020-04-06 (×3): qty 2

## 2020-04-06 MED ORDER — THIAMINE HCL 100 MG PO TABS
100.0000 mg | ORAL_TABLET | Freq: Every day | ORAL | Status: DC
  Administered 2020-04-07 – 2020-04-19 (×13): 100 mg via ORAL
  Filled 2020-04-06 (×13): qty 1

## 2020-04-06 MED ORDER — FOLIC ACID 1 MG PO TABS
1.0000 mg | ORAL_TABLET | Freq: Every day | ORAL | Status: DC
  Administered 2020-04-07 – 2020-04-19 (×13): 1 mg via ORAL
  Filled 2020-04-06 (×13): qty 1

## 2020-04-06 MED ORDER — PANTOPRAZOLE SODIUM 40 MG PO TBEC
40.0000 mg | DELAYED_RELEASE_TABLET | Freq: Every day | ORAL | Status: DC
  Administered 2020-04-07 – 2020-04-19 (×13): 40 mg via ORAL
  Filled 2020-04-06 (×13): qty 1

## 2020-04-06 MED ORDER — LORAZEPAM 2 MG/ML IJ SOLN: 0.0000 mg | Freq: Four times a day (QID) | INTRAMUSCULAR | Status: AC

## 2020-04-06 MED ORDER — ADULT MULTIVITAMIN W/MINERALS CH
1.0000 | ORAL_TABLET | Freq: Every day | ORAL | Status: DC
  Administered 2020-04-07 – 2020-04-19 (×13): 1 via ORAL
  Filled 2020-04-06 (×14): qty 1

## 2020-04-06 MED ORDER — THIAMINE HCL 100 MG/ML IJ SOLN
100.0000 mg | Freq: Every day | INTRAMUSCULAR | Status: DC
  Filled 2020-04-06 (×3): qty 2

## 2020-04-06 MED ORDER — LORAZEPAM 2 MG/ML IJ SOLN
1.0000 mg | INTRAMUSCULAR | Status: AC | PRN
  Filled 2020-04-06: qty 1
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
## 2020-04-06 NOTE — ED Notes (Signed)
Upon this RN arrival to bedside, pt states, "I was dozing". Pt visualized in NAD at this time. Pt requesting remote for the TV. Pt c/o intermittent pain to his back with moving his shoulders back. Pt denies further needs. Lights dimmed for patient comfort. Call bell remains within reach at this time.

## 2020-04-06 NOTE — ED Triage Notes (Signed)
Pt presents to ED via [facility] with c/o fall and bilateral hip and R shoulder pain. Per EMS pt reports laying on ground x 7 days since falling and being unable to get up and unable to call for assistance. Pt presents A&O x4.   Pt noted to be visible soiled upon arrival to ED.   CBG 94 138/96 112ST 97% RA

## 2020-04-06 NOTE — ED Notes (Signed)
Pt transported to Xray at this time.
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
## 2020-04-06 NOTE — ED Notes (Signed)
EDP at bedside  

## 2020-04-06 NOTE — ED Notes (Signed)
Date and time results received: 04/06/20 1:58 PM  (use smartphrase ".now" to insert current time)  Test: Lactic Critical Value: 2.4  Name of Provider Notified: Siadecki  Orders Received? Or Actions Taken?: Critical results acknowledged
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
## 2020-04-06 NOTE — ED Notes (Signed)
Pt assisted off bedside commode and cleaned, new gown and diaper applied, some redness noted to perineal and head of penis, pt requesting applesauce (cold) and pt ate 100% of meal

## 2020-04-07 DIAGNOSIS — T796XXA Traumatic ischemia of muscle, initial encounter: Secondary | ICD-10-CM | POA: Diagnosis present

## 2020-04-07 DIAGNOSIS — M25512 Pain in left shoulder: Secondary | ICD-10-CM | POA: Diagnosis present

## 2020-04-07 DIAGNOSIS — T796XXD Traumatic ischemia of muscle, subsequent encounter: Secondary | ICD-10-CM | POA: Diagnosis not present

## 2020-04-07 DIAGNOSIS — I10 Essential (primary) hypertension: Secondary | ICD-10-CM | POA: Diagnosis present

## 2020-04-07 DIAGNOSIS — D696 Thrombocytopenia, unspecified: Secondary | ICD-10-CM | POA: Diagnosis present

## 2020-04-07 DIAGNOSIS — Z20822 Contact with and (suspected) exposure to covid-19: Secondary | ICD-10-CM | POA: Diagnosis present

## 2020-04-07 DIAGNOSIS — M25551 Pain in right hip: Secondary | ICD-10-CM | POA: Diagnosis present

## 2020-04-07 DIAGNOSIS — G839 Paralytic syndrome, unspecified: Secondary | ICD-10-CM | POA: Diagnosis present

## 2020-04-07 DIAGNOSIS — W19XXXA Unspecified fall, initial encounter: Secondary | ICD-10-CM | POA: Diagnosis not present

## 2020-04-07 DIAGNOSIS — W010XXA Fall on same level from slipping, tripping and stumbling without subsequent striking against object, initial encounter: Secondary | ICD-10-CM | POA: Diagnosis present

## 2020-04-07 DIAGNOSIS — R296 Repeated falls: Secondary | ICD-10-CM | POA: Diagnosis present

## 2020-04-07 DIAGNOSIS — Z79899 Other long term (current) drug therapy: Secondary | ICD-10-CM | POA: Diagnosis not present

## 2020-04-07 DIAGNOSIS — Z87820 Personal history of traumatic brain injury: Secondary | ICD-10-CM | POA: Diagnosis not present

## 2020-04-07 DIAGNOSIS — Z87891 Personal history of nicotine dependence: Secondary | ICD-10-CM | POA: Diagnosis not present

## 2020-04-07 DIAGNOSIS — Z9049 Acquired absence of other specified parts of digestive tract: Secondary | ICD-10-CM | POA: Diagnosis not present

## 2020-04-07 DIAGNOSIS — I480 Paroxysmal atrial fibrillation: Secondary | ICD-10-CM

## 2020-04-07 DIAGNOSIS — M6282 Rhabdomyolysis: Secondary | ICD-10-CM | POA: Diagnosis present

## 2020-04-07 DIAGNOSIS — R7401 Elevation of levels of liver transaminase levels: Secondary | ICD-10-CM

## 2020-04-07 DIAGNOSIS — F1010 Alcohol abuse, uncomplicated: Secondary | ICD-10-CM | POA: Diagnosis present

## 2020-04-07 DIAGNOSIS — E860 Dehydration: Secondary | ICD-10-CM | POA: Diagnosis present

## 2020-04-07 DIAGNOSIS — E43 Unspecified severe protein-calorie malnutrition: Secondary | ICD-10-CM | POA: Diagnosis present

## 2020-04-07 DIAGNOSIS — Z9181 History of falling: Secondary | ICD-10-CM | POA: Diagnosis not present

## 2020-04-07 DIAGNOSIS — E876 Hypokalemia: Secondary | ICD-10-CM | POA: Diagnosis present

## 2020-04-07 DIAGNOSIS — R945 Abnormal results of liver function studies: Secondary | ICD-10-CM | POA: Diagnosis not present

## 2020-04-07 DIAGNOSIS — M25552 Pain in left hip: Secondary | ICD-10-CM | POA: Diagnosis present

## 2020-04-07 DIAGNOSIS — F329 Major depressive disorder, single episode, unspecified: Secondary | ICD-10-CM | POA: Diagnosis present

## 2020-04-07 DIAGNOSIS — K7030 Alcoholic cirrhosis of liver without ascites: Secondary | ICD-10-CM | POA: Diagnosis present

## 2020-04-07 DIAGNOSIS — Y900 Blood alcohol level of less than 20 mg/100 ml: Secondary | ICD-10-CM | POA: Diagnosis present

## 2020-04-07 DIAGNOSIS — K219 Gastro-esophageal reflux disease without esophagitis: Secondary | ICD-10-CM | POA: Diagnosis present

## 2020-04-07 DIAGNOSIS — E871 Hypo-osmolality and hyponatremia: Secondary | ICD-10-CM | POA: Diagnosis present

## 2020-04-07 DIAGNOSIS — Z6820 Body mass index (BMI) 20.0-20.9, adult: Secondary | ICD-10-CM | POA: Diagnosis not present

## 2020-04-07 LAB — BASIC METABOLIC PANEL
Anion gap: 7 (ref 5–15)
BUN: 12 mg/dL (ref 8–23)
CO2: 25 mmol/L (ref 22–32)
Calcium: 7.8 mg/dL — ABNORMAL LOW (ref 8.9–10.3)
Chloride: 103 mmol/L (ref 98–111)
Creatinine, Ser: 0.56 mg/dL — ABNORMAL LOW (ref 0.61–1.24)
GFR calc Af Amer: >60 mL/min (ref >60)
GFR calc non Af Amer: >60 mL/min (ref >60)
Glucose, Bld: 104 mg/dL — ABNORMAL HIGH (ref 70–99)
Potassium: 3.4 mmol/L — ABNORMAL LOW (ref 3.5–5.1)
Sodium: 135 mmol/L (ref 135–145)

## 2020-04-07 LAB — MAGNESIUM: Magnesium: 1.8 mg/dL (ref 1.7–2.4)

## 2020-04-07 LAB — CBC
HCT: 36.7 % — ABNORMAL LOW (ref 39.0–52.0)
Hemoglobin: 13.1 g/dL (ref 13.0–17.0)
MCH: 31.9 pg (ref 26.0–34.0)
MCHC: 35.7 g/dL (ref 30.0–36.0)
MCV: 89.3 fL (ref 80.0–100.0)
Platelets: 167 10*3/uL (ref 150–400)
RBC: 4.11 MIL/uL — ABNORMAL LOW (ref 4.22–5.81)
RDW: 13.1 % (ref 11.5–15.5)
WBC: 8.3 10*3/uL (ref 4.0–10.5)
nRBC: 0 % (ref 0.0–0.2)

## 2020-04-07 LAB — CK: Total CK: 2312 U/L — ABNORMAL HIGH (ref 49–397)

## 2020-04-07 MED ORDER — SODIUM CHLORIDE 0.9% FLUSH
10.0000 mL | Freq: Two times a day (BID) | INTRAVENOUS | Status: DC
  Administered 2020-04-07: 10 mL
  Administered 2020-04-08: 20 mL
  Administered 2020-04-09 – 2020-04-19 (×15): 10 mL

## 2020-04-07 MED ORDER — SODIUM CHLORIDE 0.9% FLUSH: 10.0000 mL | INTRAVENOUS | Status: DC | PRN

## 2020-04-07 MED ORDER — POTASSIUM CHLORIDE CRYS ER 20 MEQ PO TBCR
20.0000 meq | EXTENDED_RELEASE_TABLET | Freq: Once | ORAL | Status: AC
  Administered 2020-04-07: 10:00:00 20 meq via ORAL
  Filled 2020-04-07: qty 1

## 2020-04-07 MED ORDER — ENSURE ENLIVE PO LIQD
237.0000 mL | Freq: Three times a day (TID) | ORAL | Status: DC
  Administered 2020-04-07 – 2020-04-11 (×11): 237 mL via ORAL

## 2020-04-07 NOTE — Progress Notes (Addendum)
PROGRESS NOTE    Douglas Peterson.  BWG:[id number] DOB: 1951-09-28 DOA: 04/06/2020 PCP: [name], MD   Assessment & Plan:   Principal Problem:   Rhabdomyolysis Active Problems:   Hyponatremia   HTN (hypertension)   Alcohol abuse   GERD (gastroesophageal reflux disease)   Depression   AF (paroxysmal atrial fibrillation) (HCC)   Abnormal LFTs   Fall at home, initial encounter   Elevated lactic acid level   Rhabdomyolysis: secondary to fall at home & laid on the floor for 5 days. CK is still elevated but trending down. Cr is WNL. Will continue on IVFs  Hypokalemia: KCl repleted. Will continue to monitor   Hyponatremia: resolved  HTN: continue on home dose of atenolol  Alcohol abuse: continue on CIWA protocol. Ethanol level <10 on admission   GERD: continue on PPI   Depression: severity unknown. Continue on home dose of doxepin and paxil  PAF: not on anticoagulants likely secondary to fall risk. Continue atenolol  Transaminitis: likely due to alcohol abuse. Avoid using Tylenol  Fall: at home. X-ray of pelvis and left shoulder negative for bony fracture. PT/OT consulted  Elevated lactic acid level: resolved   DVT prophylaxis: lovenox Code Status: full  Family Communication:  Disposition Plan: depends on PT/OT recs   Consultants:      Procedures:    Antimicrobials:    Subjective: Pt c/o nausea  Objective: Vitals:   04/06/20 2020 04/06/20 2329 04/07/20 0416 04/07/20 0734  BP: (!) 151/78 (!) 141/87 140/84 138/85  Pulse: 94 87 71 72  Resp:  16 19   Temp: 98.4 F (36.9 C) 98.2 F (36.8 C) 97.8 F (36.6 C) 98.2 F (36.8 C)  TempSrc: Oral Oral Oral Oral  SpO2: 95% 97% 97% 92%  Weight:      Height:        Intake/Output Summary (Last 24 hours) at 04/07/2020 0751 Last data filed at 04/07/2020 0500 Gross per 24 hour  Intake 2000 ml  Output 250 ml  Net 1750 ml   Filed Weights   04/06/20 1228  Weight: 51.7 kg     Examination:  General exam: Appears calm and comfortable  Respiratory system: Clear to auscultation. Respiratory effort normal. Cardiovascular system: S1 & S2 +. No rubs, gallops or clicks.  Gastrointestinal system: Abdomen is nondistended, soft and nontender. Normal bowel sounds heard. Central nervous system: Alert and oriented. Moves all 4 extremities  Psychiatry: Judgement and insight appear normal. Mood & affect appropriate.     Data Reviewed: I have personally reviewed following labs and imaging studies  CBC: Recent Labs  Lab 04/06/20 1309  WBC 11.5*  NEUTROABS 6.7  HGB 17.8*  HCT 50.0  MCV 88.5  PLT 177   Basic Metabolic Panel: Recent Labs  Lab 04/06/20 1309 04/06/20 1531 04/07/20 0557  NA 133* 133* 135  K 3.9 3.7 3.4*  CL 90* 95* 103  CO2 22 18* 25  GLUCOSE 80 77 104*  BUN 11 11 12   CREATININE 0.66 0.63 0.56*  CALCIUM 9.5 8.5* 7.8*  MG  --   --  1.8  PHOS  --  2.3*  --    GFR: Estimated Creatinine Clearance: 64.6 mL/min (A) (by C-G formula based on SCr of 0.56 mg/dL (L)). Liver Function Tests: Recent Labs  Lab 04/06/20 1309 04/06/20 1531  AST 227* 185*  ALT 86* 73*  ALKPHOS 82 66  BILITOT 3.2* 3.0*  PROT 8.4* 6.9  ALBUMIN 4.6 3.9   No results for input(s): LIPASE,  AMYLASE in the last 168 hours. No results for input(s): AMMONIA in the last 168 hours. Coagulation Profile: No results for input(s): INR, PROTIME in the last 168 hours. Cardiac Enzymes: Recent Labs  Lab 04/06/20 1309 04/07/20 0557  CKTOTAL 5,659* 2,312*   BNP (last 3 results) No results for input(s): PROBNP in the last 8760 hours. HbA1C: No results for input(s): HGBA1C in the last 72 hours. CBG: No results for input(s): GLUCAP in the last 168 hours. Lipid Profile: No results for input(s): CHOL, HDL, LDLCALC, TRIG, CHOLHDL, LDLDIRECT in the last 72 hours. Thyroid Function Tests: No results for input(s): TSH, T4TOTAL, FREET4, T3FREE, THYROIDAB in the last 72  hours. Anemia Panel: No results for input(s): VITAMINB12, FOLATE, FERRITIN, TIBC, IRON, RETICCTPCT in the last 72 hours. Sepsis Labs: Recent Labs  Lab 04/06/20 1309 04/06/20 1450  LATICACIDVEN 2.4* 1.5    Recent Results (from the past 240 hour(s))  SARS Coronavirus 2 by RT PCR (hospital order, performed in [facility] hospital lab) Nasopharyngeal Nasopharyngeal Swab     Status: None   Collection Time: 04/06/20  3:30 PM   Specimen: Nasopharyngeal Swab  Result Value Ref Range Status   SARS Coronavirus 2 NEGATIVE NEGATIVE Final    Comment: (NOTE) SARS-CoV-2 target nucleic acids are NOT DETECTED. The SARS-CoV-2 RNA is generally detectable in upper and lower respiratory specimens during the acute phase of infection. The lowest concentration of SARS-CoV-2 viral copies this assay can detect is 250 copies / mL. A negative result does not preclude SARS-CoV-2 infection and should not be used as the sole basis for treatment or other patient management decisions.  A negative result may occur with improper specimen collection / handling, submission of specimen other than nasopharyngeal swab, presence of viral mutation(s) within the areas targeted by this assay, and inadequate number of viral copies (<250 copies / mL). A negative result must be combined with clinical observations, patient history, and epidemiological information. Fact Sheet for Patients:   StrictlyIdeas.no Fact Sheet for Healthcare Providers: BankingDealers.co.za This test is not yet approved or cleared  by the [place] FDA and has been authorized for detection and/or diagnosis of SARS-CoV-2 by FDA under an Emergency Use Authorization (EUA).  This EUA will remain in effect (meaning this test can be used) for the duration of the COVID-19 declaration under Section 564(b)(1) of the Act, 21 U.S.C. section 360bbb-3(b)(1), unless the authorization is terminated or revoked  sooner. Performed at [facility], [address], [place], [place] [address]          Radiology Studies: DG Pelvis 1-2 Views  Result Date: 04/06/2020 CLINICAL DATA:  Bilateral hip pain after fall week ago. EXAM: PELVIS - 1-2 VIEW COMPARISON:  CT abdomen pelvis dated Mar 17, 2020. FINDINGS: There is no evidence of pelvic fracture or diastasis. No pelvic bone lesions are seen. Mild bilateral hip osteoarthritis. Bowel gas overlying the left obturator foramen consistent with known left inguinal hernia. IMPRESSION: 1. No acute osseous abnormality. 2. Left inguinal hernia containing bowel. Electronically Signed   By: Titus Dubin M.D.   On: 04/06/2020 13:55   DG Shoulder Left  Result Date: 04/06/2020 CLINICAL DATA:  Left shoulder pain after fall week ago. EXAM: LEFT SHOULDER - 2+ VIEW COMPARISON:  Left humerus x-rays dated Mar 15, 2018. FINDINGS: No acute fracture or dislocation. Mild acromioclavicular osteoarthritis. Soft tissues are unremarkable. IMPRESSION: 1. No acute osseous abnormality. Electronically Signed   By: Titus Dubin M.D.   On: 04/06/2020 13:56  Scheduled Meds: . atenolol  25 mg Oral BID  . baclofen  10 mg Oral TID  . doxepin  10 mg Oral QHS  . enoxaparin (LOVENOX) injection  40 mg Subcutaneous Q24H  . folic acid  1 mg Oral Daily  . LORazepam  0-4 mg Intravenous Q6H   Followed by  . [START ON 04/08/2020] LORazepam  0-4 mg Intravenous Q12H  . multivitamin with minerals  1 tablet Oral Daily  . pantoprazole  40 mg Oral Daily  . PARoxetine  30 mg Oral Daily  . thiamine  100 mg Oral Daily   Or  . thiamine  100 mg Intravenous Daily   Continuous Infusions: . sodium chloride 100 mL/hr at 04/06/20 2134     LOS: 0 days    Time spent: 33 mins     Wyvonnia Dusky, MD [facility] Pager [phone]  If 7PM-7AM, please contact night-coverage [email or web address] 04/07/2020, 7:51 AM
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
## 2020-04-08 DIAGNOSIS — I10 Essential (primary) hypertension: Secondary | ICD-10-CM

## 2020-04-08 DIAGNOSIS — E876 Hypokalemia: Secondary | ICD-10-CM

## 2020-04-08 LAB — CBC
HCT: 39.1 % (ref 39.0–52.0)
Hemoglobin: 13.5 g/dL (ref 13.0–17.0)
MCH: 31.3 pg (ref 26.0–34.0)
MCHC: 34.5 g/dL (ref 30.0–36.0)
MCV: 90.7 fL (ref 80.0–100.0)
Platelets: 131 10*3/uL — ABNORMAL LOW (ref 150–400)
RBC: 4.31 MIL/uL (ref 4.22–5.81)
RDW: 13 % (ref 11.5–15.5)
WBC: 4.9 10*3/uL (ref 4.0–10.5)
nRBC: 0 % (ref 0.0–0.2)

## 2020-04-08 LAB — BASIC METABOLIC PANEL
Anion gap: 8 (ref 5–15)
BUN: 7 mg/dL — ABNORMAL LOW (ref 8–23)
CO2: 25 mmol/L (ref 22–32)
Calcium: 8 mg/dL — ABNORMAL LOW (ref 8.9–10.3)
Chloride: 104 mmol/L (ref 98–111)
Creatinine, Ser: 0.46 mg/dL — ABNORMAL LOW (ref 0.61–1.24)
GFR calc Af Amer: >60 mL/min (ref >60)
GFR calc non Af Amer: >60 mL/min (ref >60)
Glucose, Bld: 93 mg/dL (ref 70–99)
Potassium: 3.1 mmol/L — ABNORMAL LOW (ref 3.5–5.1)
Sodium: 137 mmol/L (ref 135–145)

## 2020-04-08 LAB — CK: Total CK: 906 U/L — ABNORMAL HIGH (ref 49–397)

## 2020-04-08 MED ORDER — POTASSIUM CHLORIDE CRYS ER 20 MEQ PO TBCR
40.0000 meq | EXTENDED_RELEASE_TABLET | Freq: Two times a day (BID) | ORAL | Status: AC
  Administered 2020-04-08 (×2): 40 meq via ORAL
  Filled 2020-04-08 (×2): qty 2

## 2020-04-08 NOTE — Progress Notes (Signed)
°   04/08/20 0905  Vitals  Pulse Rate (!) 55  Notified MD held atenolol

## 2020-04-08 NOTE — Progress Notes (Signed)
Ok to [employer] tele order per MD Jimmye Norman

## 2020-04-08 NOTE — Progress Notes (Signed)
PROGRESS NOTE    Douglas Peterson.  GNO:[id number] DOB: 04-Jul-1951 DOA: 04/06/2020 PCP: [name], MD   Assessment & Plan:   Principal Problem:   Rhabdomyolysis Active Problems:   Hyponatremia   HTN (hypertension)   Alcohol abuse   GERD (gastroesophageal reflux disease)   Depression   AF (paroxysmal atrial fibrillation) (HCC)   Abnormal LFTs   Fall at home, initial encounter   Elevated lactic acid level   Rhabdomyolysis: secondary to fall at home & laid on the floor for 5 days. CK is still elevated but trending down again today. Cr is WNL. Will continue on IVFs  Hypokalemia: KCl repleted again. Will continue to monitor   Hyponatremia: resolved  HTN: continue on home dose of atenolol  Alcohol abuse: continue on CIWA protocol. Ethanol level <10 on admission   GERD: continue on PPI   Depression: severity unknown. Continue on home dose of doxepin and paxil  PAF: not on anticoagulants likely secondary to fall risk. Continue atenolol  Transaminitis: likely due to alcohol abuse. Avoid using Tylenol  Fall: at home. X-ray of pelvis and left shoulder negative for bony fracture. PT/OT recs SNF  Elevated lactic acid level: resolved  Severe protein malnutrition: continue on nutritional supplements   Thrombocytopenia: etiology unclear. Mild. Will continue to monitor   DVT prophylaxis: lovenox Code Status: full  Family Communication:  Disposition Plan: will likely d/c to SNF. Unlikely any barriers    Consultants:      Procedures:    Antimicrobials:    Subjective: Pt c/o pain   Objective: Vitals:   04/07/20 0931 04/07/20 1553 04/07/20 2200 04/07/20 2341  BP: 138/89 101/62  (!) 113/52  Pulse: 78 62 64 69  Resp:    16  Temp:  (!) 97.3 F (36.3 C)  (!) 96.1 F (35.6 C)  TempSrc:  Oral  Axillary  SpO2:  97%    Weight:      Height:        Intake/Output Summary (Last 24 hours) at 04/08/2020 0728 Last data filed at 04/08/2020 0500 Gross per 24  hour  Intake 986.97 ml  Output 425 ml  Net 561.97 ml   Filed Weights   04/06/20 1228  Weight: 51.7 kg    Examination:  General exam: Appears calm and comfortable  Respiratory system: Clear to auscultation. No wheezes, rales Cardiovascular system: S1 & S2 +. No rubs, gallops or clicks.  Gastrointestinal system: Abdomen is nondistended, soft and nontender. Hypoactive bowel sounds heard. Central nervous system: Alert and oriented. Moves all 4 extremities  Psychiatry: Judgement and insight appear normal. Flat mood and affect.     Data Reviewed: I have personally reviewed following labs and imaging studies  CBC: Recent Labs  Lab 04/06/20 1309 04/07/20 0557 04/08/20 0458  WBC 11.5* 8.3 4.9  NEUTROABS 6.7  --   --   HGB 17.8* 13.1 13.5  HCT 50.0 36.7* 39.1  MCV 88.5 89.3 90.7  PLT 255 167 169*   Basic Metabolic Panel: Recent Labs  Lab 04/06/20 1309 04/06/20 1531 04/07/20 0557 04/08/20 0458  NA 133* 133* 135 137  K 3.9 3.7 3.4* 3.1*  CL 90* 95* 103 104  CO2 22 18* 25 25  GLUCOSE 80 77 104* 93  BUN 11 11 12  7*  CREATININE 0.66 0.63 0.56* 0.46*  CALCIUM 9.5 8.5* 7.8* 8.0*  MG  --   --  1.8  --   PHOS  --  2.3*  --   --  GFR: Estimated Creatinine Clearance: 64.6 mL/min (A) (by C-G formula based on SCr of 0.46 mg/dL (L)). Liver Function Tests: Recent Labs  Lab 04/06/20 1309 04/06/20 1531  AST 227* 185*  ALT 86* 73*  ALKPHOS 82 66  BILITOT 3.2* 3.0*  PROT 8.4* 6.9  ALBUMIN 4.6 3.9   No results for input(s): LIPASE, AMYLASE in the last 168 hours. No results for input(s): AMMONIA in the last 168 hours. Coagulation Profile: No results for input(s): INR, PROTIME in the last 168 hours. Cardiac Enzymes: Recent Labs  Lab 04/06/20 1309 04/07/20 0557 04/08/20 0458  CKTOTAL 5,659* 2,312* 906*   BNP (last 3 results) No results for input(s): PROBNP in the last 8760 hours. HbA1C: No results for input(s): HGBA1C in the last 72 hours. CBG: No results for  input(s): GLUCAP in the last 168 hours. Lipid Profile: No results for input(s): CHOL, HDL, LDLCALC, TRIG, CHOLHDL, LDLDIRECT in the last 72 hours. Thyroid Function Tests: No results for input(s): TSH, T4TOTAL, FREET4, T3FREE, THYROIDAB in the last 72 hours. Anemia Panel: No results for input(s): VITAMINB12, FOLATE, FERRITIN, TIBC, IRON, RETICCTPCT in the last 72 hours. Sepsis Labs: Recent Labs  Lab 04/06/20 1309 04/06/20 1450  LATICACIDVEN 2.4* 1.5    Recent Results (from the past 240 hour(s))  SARS Coronavirus 2 by RT PCR (hospital order, performed in [facility] hospital lab) Nasopharyngeal Nasopharyngeal Swab     Status: None   Collection Time: 04/06/20  3:30 PM   Specimen: Nasopharyngeal Swab  Result Value Ref Range Status   SARS Coronavirus 2 NEGATIVE NEGATIVE Final    Comment: (NOTE) SARS-CoV-2 target nucleic acids are NOT DETECTED. The SARS-CoV-2 RNA is generally detectable in upper and lower respiratory specimens during the acute phase of infection. The lowest concentration of SARS-CoV-2 viral copies this assay can detect is 250 copies / mL. A negative result does not preclude SARS-CoV-2 infection and should not be used as the sole basis for treatment or other patient management decisions.  A negative result may occur with improper specimen collection / handling, submission of specimen other than nasopharyngeal swab, presence of viral mutation(s) within the areas targeted by this assay, and inadequate number of viral copies (<250 copies / mL). A negative result must be combined with clinical observations, patient history, and epidemiological information. Fact Sheet for Patients:   StrictlyIdeas.no Fact Sheet for Healthcare Providers: BankingDealers.co.za This test is not yet approved or cleared  by the [place] FDA and has been authorized for detection and/or diagnosis of SARS-CoV-2 by FDA under an Emergency Use  Authorization (EUA).  This EUA will remain in effect (meaning this test can be used) for the duration of the COVID-19 declaration under Section 564(b)(1) of the Act, 21 U.S.C. section 360bbb-3(b)(1), unless the authorization is terminated or revoked sooner. Performed at [facility], [address], [place], [place] [address]          Radiology Studies: DG Pelvis 1-2 Views  Result Date: 04/06/2020 CLINICAL DATA:  Bilateral hip pain after fall week ago. EXAM: PELVIS - 1-2 VIEW COMPARISON:  CT abdomen pelvis dated Mar 17, 2020. FINDINGS: There is no evidence of pelvic fracture or diastasis. No pelvic bone lesions are seen. Mild bilateral hip osteoarthritis. Bowel gas overlying the left obturator foramen consistent with known left inguinal hernia. IMPRESSION: 1. No acute osseous abnormality. 2. Left inguinal hernia containing bowel. Electronically Signed   By: Titus Dubin M.D.   On: 04/06/2020 13:55   DG Shoulder Left  Result Date:  04/06/2020 CLINICAL DATA:  Left shoulder pain after fall week ago. EXAM: LEFT SHOULDER - 2+ VIEW COMPARISON:  Left humerus x-rays dated Mar 15, 2018. FINDINGS: No acute fracture or dislocation. Mild acromioclavicular osteoarthritis. Soft tissues are unremarkable. IMPRESSION: 1. No acute osseous abnormality. Electronically Signed   By: Titus Dubin M.D.   On: 04/06/2020 13:56        Scheduled Meds: . atenolol  25 mg Oral BID  . baclofen  10 mg Oral TID  . doxepin  10 mg Oral QHS  . enoxaparin (LOVENOX) injection  40 mg Subcutaneous Q24H  . feeding supplement (ENSURE ENLIVE)  237 mL Oral TID BM  . folic acid  1 mg Oral Daily  . LORazepam  0-4 mg Intravenous Q6H   Followed by  . LORazepam  0-4 mg Intravenous Q12H  . multivitamin with minerals  1 tablet Oral Daily  . pantoprazole  40 mg Oral Daily  . PARoxetine  30 mg Oral Daily  . sodium chloride flush  10-40 mL Intracatheter Q12H  . thiamine  100 mg Oral Daily   Or  . thiamine  100 mg  Intravenous Daily   Continuous Infusions: . sodium chloride 100 mL/hr at 04/08/20 0629     LOS: 1 day    Time spent: 32 mins     Wyvonnia Dusky, MD [facility] Pager [phone]  If 7PM-7AM, please contact night-coverage [email or web address] 04/08/2020, 7:28 AM

## 2020-04-09 LAB — CBC
HCT: 35.8 % — ABNORMAL LOW (ref 39.0–52.0)
Hemoglobin: 12.7 g/dL — ABNORMAL LOW (ref 13.0–17.0)
MCH: 32.2 pg (ref 26.0–34.0)
MCHC: 35.5 g/dL (ref 30.0–36.0)
MCV: 90.9 fL (ref 80.0–100.0)
Platelets: 121 10*3/uL — ABNORMAL LOW (ref 150–400)
RBC: 3.94 MIL/uL — ABNORMAL LOW (ref 4.22–5.81)
RDW: 13 % (ref 11.5–15.5)
WBC: 4.3 10*3/uL (ref 4.0–10.5)
nRBC: 0 % (ref 0.0–0.2)

## 2020-04-09 LAB — COMPREHENSIVE METABOLIC PANEL
ALT: 54 U/L — ABNORMAL HIGH (ref 0–44)
AST: 75 U/L — ABNORMAL HIGH (ref 15–41)
Albumin: 3.1 g/dL — ABNORMAL LOW (ref 3.5–5.0)
Alkaline Phosphatase: 75 U/L (ref 38–126)
Anion gap: 4 — ABNORMAL LOW (ref 5–15)
BUN: 10 mg/dL (ref 8–23)
CO2: 29 mmol/L (ref 22–32)
Calcium: 8.8 mg/dL — ABNORMAL LOW (ref 8.9–10.3)
Chloride: 106 mmol/L (ref 98–111)
Creatinine, Ser: 0.46 mg/dL — ABNORMAL LOW (ref 0.61–1.24)
GFR calc Af Amer: >60 mL/min (ref >60)
GFR calc non Af Amer: >60 mL/min (ref >60)
Glucose, Bld: 104 mg/dL — ABNORMAL HIGH (ref 70–99)
Potassium: 4.2 mmol/L (ref 3.5–5.1)
Sodium: 139 mmol/L (ref 135–145)
Total Bilirubin: 0.7 mg/dL (ref 0.3–1.2)
Total Protein: 5.8 g/dL — ABNORMAL LOW (ref 6.5–8.1)

## 2020-04-09 LAB — CK: Total CK: 472 U/L — ABNORMAL HIGH (ref 49–397)

## 2020-04-09 LAB — MAGNESIUM: Magnesium: 1.4 mg/dL — ABNORMAL LOW (ref 1.7–2.4)

## 2020-04-09 MED ORDER — MAGNESIUM SULFATE 4 GM/100ML IV SOLN
4.0000 g | Freq: Once | INTRAVENOUS | Status: AC
  Administered 2020-04-09: 10:00:00 4 g via INTRAVENOUS
  Filled 2020-04-09: qty 100

## 2020-04-09 NOTE — TOC Initial Note (Signed)
Transition of Care (TOC) - Initial/Assessment Note    Patient Details  Name: Douglas Peterson. MRN: [id number] Date of Birth: Aug 10, 1951  Transition [facility] Care [facility]) CM/SW Contact:    Shelbie Hutching, RN Phone Number: 04/09/2020, 4:46 PM  Clinical Narrative:                 Patient has been admitted to [facility] multiple times this past year for alcohol related illness.  Patient admitted this time after reporting lying on the floor at home for 7 days after falling.  Patient has been to [facility] in the past.  [facility] denied request for SNF at last admission and patient ended up going home with home health.  PT evaluated and recommends SNF.  This RNCM will start bed search for SNF.    Expected Discharge Plan: Skilled Nursing Facility Barriers to Discharge: Continued Medical Work up   Patient Goals and CMS Choice   CMS Medicare.gov Compare Post Acute Care list provided to:: Patient Choice offered to / list presented to : Patient  Expected Discharge Plan and Services Expected Discharge Plan: [place]   Discharge Planning Services: CM Consult Post Acute Care Choice: [place] Living arrangements for the past 2 months: Single Family Home                                      Prior Living Arrangements/Services Living arrangements for the past 2 months: Single Family Home Lives with:: Self Patient language and need for interpreter reviewed:: Yes Do you feel safe going back to the place where you live?: Yes      Need for Family Participation in Patient Care: No (Comment) Care giver support system in place?: No (comment)   Criminal Activity/Legal Involvement Pertinent to Current Situation/Hospitalization: No - Comment as needed  Activities of Daily Living Home Assistive Devices/Equipment: Walker (specify type), Cane (specify quad or straight) ADL Screening (condition at time of admission) Patient's cognitive ability adequate to safely  complete daily activities?: Yes Is the patient deaf or have difficulty hearing?: No Does the patient have difficulty seeing, even when wearing glasses/contacts?: No Does the patient have difficulty concentrating, remembering, or making decisions?: No Patient able to express need for assistance with ADLs?: Yes Does the patient have difficulty dressing or bathing?: No Independently performs ADLs?: Yes (appropriate for developmental age) Does the patient have difficulty walking or climbing stairs?: Yes Weakness of Legs: Both Weakness of Arms/Hands: Both  Permission Sought/Granted Permission sought to share information with : Case Manager, Customer service manager, Family Supports Permission granted to share information with : Yes, Verbal Permission Granted     Permission granted to share info w AGENCY: [facility]  Permission granted to share info w Relationship: sister     Emotional Assessment Appearance:: Appears older than stated age     Orientation: : Oriented to Self, Oriented to Place, Oriented to  Time, Oriented to Situation Alcohol / Substance Use: Alcohol Use Psych Involvement: No (comment)  Admission diagnosis:  Rhabdomyolysis [M62.82] Traumatic rhabdomyolysis, initial encounter ([place]) [T79.6XXA] Patient Active Problem List   Diagnosis Date Noted  . Abnormal LFTs 04/06/2020  . Fall at home, initial encounter 04/06/2020  . Rhabdomyolysis 04/06/2020  . Elevated lactic acid level 04/06/2020  . Sepsis ([place]) 03/17/2020  . Constipation 03/17/2020  . AF (paroxysmal atrial fibrillation) ([place]) 03/17/2020  . Protein-calorie malnutrition, severe 02/25/2020  . Left inguinal  hernia 01/31/2020  . Encounter for competency evaluation   . GERD (gastroesophageal reflux disease) 01/17/2020  . Depression 01/17/2020  . Esophagitis   . Nausea without vomiting   . Weakness   . Tremor   . Pancytopenia ([place])   . Hypomagnesemia   . Alcoholic cirrhosis of liver without ascites ([place])   .  Thrombocytopenia ([place])   . Non-traumatic rhabdomyolysis   . Alcohol withdrawal delirium ([place]) 12/08/2019  . Alcohol abuse   . Alcohol intoxication, uncomplicated ([place])   . 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 Hypokalemia 09/27/2019  . Dysphagia 09/27/2019  . Alcohol withdrawal delirium, acute, hyperactive ([place]) 09/21/2019  . Pressure injury of skin 08/15/2019  . Goals of care, counseling/discussion   . Palliative care by specialist   . Alcohol withdrawal ([place]) 03/20/2019  . Low back pain 11/15/2018  . Acute metabolic encephalopathy 37/85/8850  . Delirium tremens ([place]) 03/08/2018  . Malnutrition of moderate degree 11/24/2017  . HTN (hypertension) 09/16/2017  . Hypothermia 12/17/2016  . Alcohol use   . Diarrhea   . Hyponatremia 07/09/2015   PCP:  [name], MD Pharmacy:   [facility] [address] [place] - [place] [address] [place] [address] Phone: [phone] Fax: [phone]     Social Determinants of Health (SDOH) Interventions    Readmission Risk Interventions Readmission Risk Prevention Plan 12/13/2019 12/12/2019  Transportation Screening Complete Complete  PCP or Specialist Appt within 3-5 Days (No Data) -  HRI or [place] (No Data) -  Palliative Care Screening Complete Complete  Medication Review (RN Care Manager) Complete Complete  Some recent data might be hidden

## 2020-04-09 NOTE — Progress Notes (Signed)
PROGRESS NOTE    Douglas Peterson.  XNA:[id number] DOB: 1951-09-03 DOA: 04/06/2020 PCP: [name], MD   Assessment & Plan:   Principal Problem:   Rhabdomyolysis Active Problems:   Hyponatremia   HTN (hypertension)   Alcohol abuse   GERD (gastroesophageal reflux disease)   Depression   AF (paroxysmal atrial fibrillation) (HCC)   Abnormal LFTs   Fall at home, initial encounter   Elevated lactic acid level   Rhabdomyolysis: secondary to fall at home & laid on the floor for 5 days. CK is still elevated but trending down again today. Cr is WNL. Will continue on IVFs  Hypokalemia: WNL today. Will continue to monitor   Hypomagnesemia: mg sulfate ordered. Will continue to monitor   Hyponatremia: resolved  HTN: continue on home dose of atenolol  Alcohol abuse: continue on CIWA protocol. Ethanol level <10 on admission   GERD: continue on PPI   Depression: severity unknown. Continue on home dose of doxepin and paxil  PAF: not on anticoagulants likely secondary to fall risk. Continue atenolol  Transaminitis: likely due to alcohol abuse. Avoid using Tylenol  Fall: at home. X-ray of pelvis and left shoulder negative for bony fracture. PT/OT recs SNF  Elevated lactic acid level: resolved  Severe protein malnutrition: continue on nutritional supplements   Thrombocytopenia: etiology unclear. Mild. Will continue to monitor   DVT prophylaxis: lovenox Code Status: full  Family Communication:  Disposition Plan: will likely d/c to SNF. Barriers are pt may not have any SNF days left, CM is looking into this   Status is: Inpatient  Remains inpatient appropriate because:IV treatments appropriate due to intensity of illness or inability to take PO   Dispo: The patient is from: Home              Anticipated d/c is to: SNF              Anticipated d/c date is: 1 day              Patient currently is not medically stable to d/c.   Consultants:      Procedures:      Antimicrobials:    Subjective: Pt c/o b/l hip pain & back pain.   Objective: Vitals:   04/08/20 0905 04/08/20 1250 04/08/20 1700 04/09/20 0008  BP: 114/66 115/68 120/75 128/85  Pulse: (!) 55 68 63 67  Resp: 17  12 16   Temp: 98.4 F (36.9 C)  98.7 F (37.1 C) 97.9 F (36.6 C)  TempSrc: Axillary  Axillary Oral  SpO2:   97% 98%  Weight:      Height:        Intake/Output Summary (Last 24 hours) at 04/09/2020 0717 Last data filed at 04/09/2020 0544 Gross per 24 hour  Intake --  Output 1450 ml  Net -1450 ml   Filed Weights   04/06/20 1228  Weight: 51.7 kg    Examination:  General exam: Appears calm and comfortable. Frail appearing  Respiratory system: Clear to auscultation. No rhonchi Cardiovascular system: S1 & S2 +. No rubs, gallops or clicks.  Gastrointestinal system: Abdomen is nondistended, soft and nontender. Hypoactive bowel sounds heard. Central nervous system: Alert and oriented. Moves all 4 extremities  Psychiatry: Judgement and insight appear normal. Flat mood and affect.     Data Reviewed: I have personally reviewed following labs and imaging studies  CBC: Recent Labs  Lab 04/06/20 1309 04/07/20 0557 04/08/20 0458 04/09/20 0531  WBC 11.5* 8.3 4.9 4.3  NEUTROABS  6.7  --   --   --   HGB 17.8* 13.1 13.5 12.7*  HCT 50.0 36.7* 39.1 35.8*  MCV 88.5 89.3 90.7 90.9  PLT 255 167 131* 124*   Basic Metabolic Panel: Recent Labs  Lab 04/06/20 1309 04/06/20 1531 04/07/20 0557 04/08/20 0458 04/09/20 0531  NA 133* 133* 135 137 139  K 3.9 3.7 3.4* 3.1* 4.2  CL 90* 95* 103 104 106  CO2 22 18* 25 25 29   GLUCOSE 80 77 104* 93 104*  BUN 11 11 12  7* 10  CREATININE 0.66 0.63 0.56* 0.46* 0.46*  CALCIUM 9.5 8.5* 7.8* 8.0* 8.8*  MG  --   --  1.8  --  1.4*  PHOS  --  2.3*  --   --   --    GFR: Estimated Creatinine Clearance: 64.6 mL/min (A) (by C-G formula based on SCr of 0.46 mg/dL (L)). Liver Function Tests: Recent Labs  Lab 04/06/20 1309  04/06/20 1531 04/09/20 0531  AST 227* 185* 75*  ALT 86* 73* 54*  ALKPHOS 82 66 75  BILITOT 3.2* 3.0* 0.7  PROT 8.4* 6.9 5.8*  ALBUMIN 4.6 3.9 3.1*   No results for input(s): LIPASE, AMYLASE in the last 168 hours. No results for input(s): AMMONIA in the last 168 hours. Coagulation Profile: No results for input(s): INR, PROTIME in the last 168 hours. Cardiac Enzymes: Recent Labs  Lab 04/06/20 1309 04/07/20 0557 04/08/20 0458 04/09/20 0531  CKTOTAL 5,659* 2,312* 906* 472*   BNP (last 3 results) No results for input(s): PROBNP in the last 8760 hours. HbA1C: No results for input(s): HGBA1C in the last 72 hours. CBG: No results for input(s): GLUCAP in the last 168 hours. Lipid Profile: No results for input(s): CHOL, HDL, LDLCALC, TRIG, CHOLHDL, LDLDIRECT in the last 72 hours. Thyroid Function Tests: No results for input(s): TSH, T4TOTAL, FREET4, T3FREE, THYROIDAB in the last 72 hours. Anemia Panel: No results for input(s): VITAMINB12, FOLATE, FERRITIN, TIBC, IRON, RETICCTPCT in the last 72 hours. Sepsis Labs: Recent Labs  Lab 04/06/20 1309 04/06/20 1450  LATICACIDVEN 2.4* 1.5    Recent Results (from the past 240 hour(s))  SARS Coronavirus 2 by RT PCR (hospital order, performed in [facility] hospital lab) Nasopharyngeal Nasopharyngeal Swab     Status: None   Collection Time: 04/06/20  3:30 PM   Specimen: Nasopharyngeal Swab  Result Value Ref Range Status   SARS Coronavirus 2 NEGATIVE NEGATIVE Final    Comment: (NOTE) SARS-CoV-2 target nucleic acids are NOT DETECTED. The SARS-CoV-2 RNA is generally detectable in upper and lower respiratory specimens during the acute phase of infection. The lowest concentration of SARS-CoV-2 viral copies this assay can detect is 250 copies / mL. A negative result does not preclude SARS-CoV-2 infection and should not be used as the sole basis for treatment or other patient management decisions.  A negative result may occur  with improper specimen collection / handling, submission of specimen other than nasopharyngeal swab, presence of viral mutation(s) within the areas targeted by this assay, and inadequate number of viral copies (<250 copies / mL). A negative result must be combined with clinical observations, patient history, and epidemiological information. Fact Sheet for Patients:   StrictlyIdeas.no Fact Sheet for Healthcare Providers: BankingDealers.co.za This test is not yet approved or cleared  by the [place] FDA and has been authorized for detection and/or diagnosis of SARS-CoV-2 by FDA under an Emergency Use Authorization (EUA).  This EUA will remain in effect (meaning this test  can be used) for the duration of the COVID-19 declaration under Section 564(b)(1) of the Act, 21 U.S.C. section 360bbb-3(b)(1), unless the authorization is terminated or revoked sooner. Performed at [facility], [address], [place], [place] [address]          Radiology Studies: No results found.      Scheduled Meds: . atenolol  25 mg Oral BID  . baclofen  10 mg Oral TID  . doxepin  10 mg Oral QHS  . enoxaparin (LOVENOX) injection  40 mg Subcutaneous Q24H  . feeding supplement (ENSURE ENLIVE)  237 mL Oral TID BM  . folic acid  1 mg Oral Daily  . LORazepam  0-4 mg Intravenous Q12H  . multivitamin with minerals  1 tablet Oral Daily  . pantoprazole  40 mg Oral Daily  . PARoxetine  30 mg Oral Daily  . sodium chloride flush  10-40 mL Intracatheter Q12H  . thiamine  100 mg Oral Daily   Or  . thiamine  100 mg Intravenous Daily   Continuous Infusions: . sodium chloride 100 mL/hr at 04/09/20 0519  . magnesium sulfate bolus IVPB       LOS: 2 days    Time spent: 30 mins     Wyvonnia Dusky, MD [facility] Pager [phone]  If 7PM-7AM, please contact night-coverage [email or web address] 04/09/2020, 7:17 AM

## 2020-04-10 LAB — CBC
HCT: 38.6 % — ABNORMAL LOW (ref 39.0–52.0)
Hemoglobin: 13.3 g/dL (ref 13.0–17.0)
MCH: 31.6 pg (ref 26.0–34.0)
MCHC: 34.5 g/dL (ref 30.0–36.0)
MCV: 91.7 fL (ref 80.0–100.0)
Platelets: 130 10*3/uL — ABNORMAL LOW (ref 150–400)
RBC: 4.21 MIL/uL — ABNORMAL LOW (ref 4.22–5.81)
RDW: 13.2 % (ref 11.5–15.5)
WBC: 4.2 10*3/uL (ref 4.0–10.5)
nRBC: 0 % (ref 0.0–0.2)

## 2020-04-10 LAB — COMPREHENSIVE METABOLIC PANEL
ALT: 50 U/L — ABNORMAL HIGH (ref 0–44)
AST: 53 U/L — ABNORMAL HIGH (ref 15–41)
Albumin: 3.3 g/dL — ABNORMAL LOW (ref 3.5–5.0)
Alkaline Phosphatase: 74 U/L (ref 38–126)
Anion gap: 7 (ref 5–15)
BUN: 11 mg/dL (ref 8–23)
CO2: 29 mmol/L (ref 22–32)
Calcium: 8.8 mg/dL — ABNORMAL LOW (ref 8.9–10.3)
Chloride: 102 mmol/L (ref 98–111)
Creatinine, Ser: 0.49 mg/dL — ABNORMAL LOW (ref 0.61–1.24)
GFR calc Af Amer: >60 mL/min (ref >60)
GFR calc non Af Amer: >60 mL/min (ref >60)
Glucose, Bld: 94 mg/dL (ref 70–99)
Potassium: 4.2 mmol/L (ref 3.5–5.1)
Sodium: 138 mmol/L (ref 135–145)
Total Bilirubin: 0.4 mg/dL (ref 0.3–1.2)
Total Protein: 6.2 g/dL — ABNORMAL LOW (ref 6.5–8.1)

## 2020-04-10 LAB — CK: Total CK: 271 U/L (ref 49–397)

## 2020-04-10 LAB — MAGNESIUM: Magnesium: 1.7 mg/dL (ref 1.7–2.4)

## 2020-04-10 NOTE — TOC Progression Note (Signed)
Transition of Care (TOC) - Progression Note    Patient Details  Name: Shaun Runyon. MRN: [id number] Date of Birth: 1951-06-02  Transition of Care [facility]) CM/SW Contact  Shelbie Hutching, RN Phone Number: 04/10/2020, 2:39 PM  Clinical Narrative:    [facility] will accept patient and has started authorization with [employer].    Expected Discharge Plan: [place] Barriers to Discharge: Continued Medical Work up  Expected Discharge Plan and Services Expected Discharge Plan: [place]   Discharge Planning Services: CM Consult Post Acute Care Choice: [name] Living arrangements for the past 2 months: Single Family Home                                       Social Determinants of Health (SDOH) Interventions    Readmission Risk Interventions Readmission Risk Prevention Plan 12/13/2019 12/12/2019  Transportation Screening Complete Complete  PCP or Specialist Appt within 3-5 Days (No Data) -  HRI or [place] (No Data) -  Palliative Care Screening Complete Complete  Medication Review (RN Care Manager) Complete Complete  Some recent data might be hidden
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
## 2020-04-10 NOTE — Care Management Important Message (Signed)
Important Message  Patient Details  Name: Douglas Peterson. MRN: [id number] Date of Birth: [birth year]   Medicare Important Message Given:  N/A - LOS <3 / Initial given by admissions     Juliann Pulse A  04/10/2020, 9:42 AM
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
## 2020-04-10 NOTE — NC FL2 (Signed)
[place] LEVEL OF CARE SCREENING TOOL     IDENTIFICATION  Patient Name: Douglas Peterson. Birthdate: 03/27/1951 Sex: male Admission Date (Current Location): 04/06/2020  [place] and [place] Number:  Engineering geologist and Address:  [facility], [address], [place], [place] [address]      Provider Number: [id number]  Attending Physician Name and Address:  Wyvonnia Dusky, MD  Relative Name and Phone Number:  Lance Sell - sister [phone]    Current Level of Care: Hospital Recommended Level of Care: [place] Prior Approval Number:    Date Approved/Denied:   PASRR Number: [id number]  Discharge Plan: SNF    Current Diagnoses: Patient Active Problem List   Diagnosis Date Noted  . Abnormal LFTs 04/06/2020  . Fall at home, initial encounter 04/06/2020  . Rhabdomyolysis 04/06/2020  . Elevated lactic acid level 04/06/2020  . Sepsis ([place]) 03/17/2020  . Constipation 03/17/2020  . AF (paroxysmal atrial fibrillation) ([place]) 03/17/2020  . Protein-calorie malnutrition, severe 02/25/2020  . Left inguinal hernia 01/31/2020  . Encounter for competency evaluation   . GERD (gastroesophageal reflux disease) 01/17/2020  . Depression 01/17/2020  . Esophagitis   . Nausea without vomiting   . Weakness   . Tremor   . Pancytopenia ([place])   . Hypomagnesemia   . Alcoholic cirrhosis of liver without ascites ([place])   . Thrombocytopenia ([place])   . Non-traumatic rhabdomyolysis   . Alcohol withdrawal delirium ([place]) 12/08/2019  . Alcohol abuse   . Alcohol intoxication, uncomplicated ([place])   . 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 Hypokalemia 09/27/2019  . Dysphagia 09/27/2019  . Alcohol withdrawal delirium, acute, hyperactive ([place]) 09/21/2019  . Pressure injury of skin 08/15/2019  . Goals of care, counseling/discussion   . Palliative care by specialist   . Alcohol withdrawal ([place])  03/20/2019  . Low back pain 11/15/2018  . Acute metabolic encephalopathy 28/78/6767  . Delirium tremens ([place]) 03/08/2018  . Malnutrition of moderate degree 11/24/2017  . HTN (hypertension) 09/16/2017  . Hypothermia 12/17/2016  . Alcohol use   . Diarrhea   . Hyponatremia 07/09/2015    Orientation RESPIRATION BLADDER Height & Weight     Self, Time, Situation, Place  Normal Continent Weight: 51.7 kg Height:  5' 3"  (160 cm)  BEHAVIORAL SYMPTOMS/MOOD NEUROLOGICAL BOWEL NUTRITION STATUS      Continent Diet(see discharge summary)  AMBULATORY STATUS COMMUNICATION OF NEEDS Skin   Extensive Assist Verbally Skin abrasions(both arms and both legs from fall at home)                       Personal Care Assistance Level of Assistance    Bathing Assistance: Limited assistance   Dressing Assistance: Limited assistance     Functional Limitations Info             SPECIAL CARE FACTORS FREQUENCY  PT (By licensed PT), OT (By licensed OT)     PT Frequency: 5 times per week OT Frequency: 5 times per week            Contractures Contractures Info: Not present    Additional Factors Info  Code Status Code Status Info: Full Code             Current Medications (04/10/2020):  This is the current hospital active medication list Current Facility-Administered Medications  Medication Dose Route Frequency Provider Last Rate Last Admin  . atenolol (TENORMIN) tablet 25 mg  25 mg Oral BID Ivor Costa, MD   25 mg at 04/10/20 0840  . baclofen (LIORESAL) tablet 10 mg  10 mg Oral TID Ivor Costa, MD   10 mg at 04/10/20 0839  . doxepin (SINEQUAN) capsule 10 mg  10 mg Oral QHS Ivor Costa, MD   10 mg at 04/09/20 2313  . enoxaparin (LOVENOX) injection 40 mg  40 mg Subcutaneous Q24H Ivor Costa, MD   40 mg at 04/09/20 2312  . feeding supplement (ENSURE ENLIVE) (ENSURE ENLIVE) liquid 237 mL  237 mL Oral TID BM Wyvonnia Dusky, MD   237 mL at 04/10/20 0840  . folic acid (FOLVITE) tablet 1 mg  1  mg Oral Daily Ivor Costa, MD   1 mg at 04/10/20 7062  . LORazepam (ATIVAN) injection 0-4 mg  0-4 mg Intravenous Q12H Ivor Costa, MD      . multivitamin with minerals tablet 1 tablet  1 tablet Oral Daily Ivor Costa, MD   1 tablet at 04/10/20 [phone]  . ondansetron (ZOFRAN) injection 4 mg  4 mg Intravenous Q8H PRN Ivor Costa, MD      . oxyCODONE (Oxy IR/ROXICODONE) immediate release tablet 5 mg  5 mg Oral Q6H PRN Ivor Costa, MD   5 mg at 04/10/20 0842  . pantoprazole (PROTONIX) EC tablet 40 mg  40 mg Oral Daily Ivor Costa, MD   40 mg at 04/10/20 0839  . PARoxetine (PAXIL) tablet 30 mg  30 mg Oral Daily Ivor Costa, MD   30 mg at 04/10/20 0840  . sodium chloride flush (NS) 0.9 % injection 10-40 mL  10-40 mL Intracatheter Q12H Wyvonnia Dusky, MD   10 mL at 04/09/20 0949  . sodium chloride flush (NS) 0.9 % injection 10-40 mL  10-40 mL Intracatheter PRN Wyvonnia Dusky, MD      . thiamine tablet 100 mg  100 mg Oral Daily Ivor Costa, MD   100 mg at 04/10/20 8315   Or  . thiamine (B-1) injection 100 mg  100 mg Intravenous Daily Ivor Costa, MD         Discharge Medications: Please see discharge summary for a list of discharge medications.  Relevant Imaging Results:  Relevant Lab Results:   Additional Information SS# [id number]  Shelbie Hutching, RN

## 2020-04-11 ENCOUNTER — Encounter: Payer: Self-pay | Admitting: Internal Medicine

## 2020-04-11 DIAGNOSIS — T796XXD Traumatic ischemia of muscle, subsequent encounter: Secondary | ICD-10-CM

## 2020-04-11 LAB — COMPREHENSIVE METABOLIC PANEL
ALT: 48 U/L — ABNORMAL HIGH (ref 0–44)
AST: 43 U/L — ABNORMAL HIGH (ref 15–41)
Albumin: 3.6 g/dL (ref 3.5–5.0)
Alkaline Phosphatase: 71 U/L (ref 38–126)
Anion gap: 7 (ref 5–15)
BUN: 13 mg/dL (ref 8–23)
CO2: 34 mmol/L — ABNORMAL HIGH (ref 22–32)
Calcium: 9.2 mg/dL (ref 8.9–10.3)
Chloride: 98 mmol/L (ref 98–111)
Creatinine, Ser: 0.52 mg/dL — ABNORMAL LOW (ref 0.61–1.24)
GFR calc Af Amer: >60 mL/min (ref >60)
GFR calc non Af Amer: >60 mL/min (ref >60)
Glucose, Bld: 101 mg/dL — ABNORMAL HIGH (ref 70–99)
Potassium: 3.8 mmol/L (ref 3.5–5.1)
Sodium: 139 mmol/L (ref 135–145)
Total Bilirubin: 0.8 mg/dL (ref 0.3–1.2)
Total Protein: 6.8 g/dL (ref 6.5–8.1)

## 2020-04-11 LAB — CK: Total CK: 169 U/L (ref 49–397)

## 2020-04-11 LAB — CBC
HCT: 40.6 % (ref 39.0–52.0)
Hemoglobin: 13.8 g/dL (ref 13.0–17.0)
MCH: 31.5 pg (ref 26.0–34.0)
MCHC: 34 g/dL (ref 30.0–36.0)
MCV: 92.7 fL (ref 80.0–100.0)
Platelets: 153 10*3/uL (ref 150–400)
RBC: 4.38 MIL/uL (ref 4.22–5.81)
RDW: 13.2 % (ref 11.5–15.5)
WBC: 5 10*3/uL (ref 4.0–10.5)
nRBC: 0 % (ref 0.0–0.2)

## 2020-04-11 LAB — MAGNESIUM: Magnesium: 1.6 mg/dL — ABNORMAL LOW (ref 1.7–2.4)

## 2020-04-11 LAB — PHOSPHORUS: Phosphorus: 5 mg/dL — ABNORMAL HIGH (ref 2.5–4.6)

## 2020-04-11 MED ORDER — SENNOSIDES-DOCUSATE SODIUM 8.6-50 MG PO TABS
1.0000 | ORAL_TABLET | Freq: Two times a day (BID) | ORAL | Status: DC
  Administered 2020-04-11 – 2020-04-19 (×17): 1 via ORAL
  Filled 2020-04-11 (×17): qty 1

## 2020-04-11 MED ORDER — ENSURE ENLIVE PO LIQD
237.0000 mL | Freq: Two times a day (BID) | ORAL | Status: DC
  Administered 2020-04-12 – 2020-04-19 (×16): 237 mL via ORAL

## 2020-04-11 MED ORDER — MAGNESIUM SULFATE 2 GM/50ML IV SOLN
2.0000 g | Freq: Once | INTRAVENOUS | Status: AC
  Administered 2020-04-11: 10:00:00 2 g via INTRAVENOUS
  Filled 2020-04-11: qty 50
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
## 2020-04-12 LAB — BASIC METABOLIC PANEL
Anion gap: 9 (ref 5–15)
BUN: 12 mg/dL (ref 8–23)
CO2: 31 mmol/L (ref 22–32)
Calcium: 9.6 mg/dL (ref 8.9–10.3)
Chloride: 100 mmol/L (ref 98–111)
Creatinine, Ser: 0.62 mg/dL (ref 0.61–1.24)
GFR calc Af Amer: >60 mL/min (ref >60)
GFR calc non Af Amer: >60 mL/min (ref >60)
Glucose, Bld: 101 mg/dL — ABNORMAL HIGH (ref 70–99)
Potassium: 4.1 mmol/L (ref 3.5–5.1)
Sodium: 140 mmol/L (ref 135–145)

## 2020-04-12 LAB — MAGNESIUM: Magnesium: 1.9 mg/dL (ref 1.7–2.4)

## 2020-04-12 LAB — PHOSPHORUS: Phosphorus: 3.9 mg/dL (ref 2.5–4.6)
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
## 2020-04-12 NOTE — Progress Notes (Signed)
Pt sister called voiced concern about pt discharge to his house. She " that pt has lost his garage door opener in the ED on this admission. She stated due to patient impaired balance and limited function of the use of right up extremities,  Pt will not be able to enter his house from the door because of several steps in his front door entrance. Pt uses cane and drag's his right leg when walking. "She dose not want him to put in a taxi and dropped off because he will not be able to enter his house from the front door and she is not available to assist him either because of her own health issues".  sisters name is Douglas Peterson  [phone]

## 2020-04-12 NOTE — Care Management Important Message (Signed)
Important Message  Patient Details  Name: Douglas Peterson. MRN: [id number] Date of Birth: 1950/12/31   Medicare Important Message Given:  Yes     [name] 04/12/2020, 1:07 PM
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
## 2020-04-13 NOTE — TOC Progression Note (Signed)
Transition of Care (TOC) - Progression Note    Patient Details  Name: Douglas Peterson. MRN: [id number] Date of Birth: May 19, 1951  Transition of Care [facility]) CM/SW Contact  Meriel Flavors, [place] Phone Number: 04/13/2020, 2:41 PM  Clinical Narrative:    6/11: Still waiting on authorization   Expected Discharge Plan: [place] Barriers to Discharge: Continued Medical Work up  Expected Discharge Plan and Services Expected Discharge Plan: [place]   Discharge Planning Services: CM Consult Post Acute Care Choice: [place] Living arrangements for the past 2 months: Single Family Home                                       Social Determinants of Health (SDOH) Interventions    Readmission Risk Interventions Readmission Risk Prevention Plan 12/13/2019 12/12/2019  Transportation Screening Complete Complete  PCP or Specialist Appt within 3-5 Days (No Data) -  HRI or [place] (No Data) -  Palliative Care Screening Complete Complete  Medication Review (RN Care Manager) Complete Complete  Some recent data might be hidden

## 2020-04-14 DIAGNOSIS — E871 Hypo-osmolality and hyponatremia: Secondary | ICD-10-CM

## 2020-04-14 MED ORDER — ALPRAZOLAM 0.25 MG PO TABS
0.2500 mg | ORAL_TABLET | Freq: Every evening | ORAL | Status: DC | PRN
  Administered 2020-04-15 – 2020-04-18 (×4): 0.25 mg via ORAL
  Filled 2020-04-14 (×4): qty 1
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
## 2020-04-15 LAB — BASIC METABOLIC PANEL
Anion gap: 7 (ref 5–15)
BUN: 14 mg/dL (ref 8–23)
CO2: 29 mmol/L (ref 22–32)
Calcium: 8.8 mg/dL — ABNORMAL LOW (ref 8.9–10.3)
Chloride: 103 mmol/L (ref 98–111)
Creatinine, Ser: 0.65 mg/dL (ref 0.61–1.24)
GFR calc Af Amer: >60 mL/min (ref >60)
GFR calc non Af Amer: >60 mL/min (ref >60)
Glucose, Bld: 133 mg/dL — ABNORMAL HIGH (ref 70–99)
Potassium: 3.4 mmol/L — ABNORMAL LOW (ref 3.5–5.1)
Sodium: 139 mmol/L (ref 135–145)

## 2020-04-15 LAB — PHOSPHORUS: Phosphorus: 3.1 mg/dL (ref 2.5–4.6)

## 2020-04-15 LAB — MAGNESIUM: Magnesium: 2.1 mg/dL (ref 1.7–2.4)
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
## 2020-04-16 MED ORDER — POTASSIUM CHLORIDE CRYS ER 20 MEQ PO TBCR
40.0000 meq | EXTENDED_RELEASE_TABLET | Freq: Once | ORAL | Status: AC
  Administered 2020-04-16: 09:00:00 40 meq via ORAL
  Filled 2020-04-16: qty 2

## 2020-04-16 NOTE — Progress Notes (Signed)
Plan of care and medications reviewed with pt.; voiced understanding; no complaints of pain; safety precautions maintained; bed low and locked; call bell within reach.
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
## 2020-04-16 NOTE — Care Management Important Message (Signed)
Important Message  Patient Details  Name: Douglas Peterson. MRN: [id number] Date of Birth: May 12, 1951   Medicare Important Message Given:  Yes     Juliann Pulse A  04/16/2020, 11:53 AM

## 2020-04-16 NOTE — TOC Progression Note (Signed)
Transition of Care (TOC) - Progression Note    Patient Details  Name: Douglas Peterson. MRN: [id number] Date of Birth: 1951/08/17  Transition of Care [facility]) CM/SW Contact  Su Hilt, RN Phone Number: 04/16/2020, 9:28 AM  Clinical Narrative:    Reached out to [facility] [employer] (Tammy) to check the status of the auth, it is still Pending with [name]   Expected Discharge Plan: [place] Barriers to Discharge: Continued Medical Work up  Expected Discharge Plan and Services Expected Discharge Plan: [place]   Discharge Planning Services: CM Consult Post Acute Care Choice: [place] Living arrangements for the past 2 months: Single Family Home                                       Social Determinants of Health (SDOH) Interventions    Readmission Risk Interventions Readmission Risk Prevention Plan 12/13/2019 12/12/2019  Transportation Screening Complete Complete  PCP or Specialist Appt within 3-5 Days (No Data) -  HRI or [place] (No Data) -  Palliative Care Screening Complete Complete  Medication Review (RN Care Manager) Complete Complete  Some recent data might be hidden

## 2020-04-17 LAB — MAGNESIUM: Magnesium: 1.8 mg/dL (ref 1.7–2.4)

## 2020-04-17 LAB — POTASSIUM: Potassium: 3.6 mmol/L (ref 3.5–5.1)

## 2020-04-17 LAB — PHOSPHORUS: Phosphorus: 3.3 mg/dL (ref 2.5–4.6)

## 2020-04-17 MED ORDER — POLYETHYLENE GLYCOL 3350 17 G PO PACK
17.0000 g | PACK | Freq: Every day | ORAL | Status: DC
  Administered 2020-04-17 – 2020-04-19 (×3): 17 g via ORAL
  Filled 2020-04-17 (×3): qty 1

## 2020-04-17 NOTE — TOC Progression Note (Signed)
Transition of Care (TOC) - Progression Note    Patient Details  Name: Douglas Peterson. MRN: [id number] Date of Birth: 07/27/1951  Transition [facility] Care [facility]) CM/SW Contact  Su Hilt, RN Phone Number: 04/17/2020, 9:26 AM  Clinical Narrative:    Reached out to Tammy at [facility] to check the status of the ins auth, still pending   Expected Discharge Plan: [place] Barriers to Discharge: Continued Medical Work up  Expected Discharge Plan and Services Expected Discharge Plan: [place]   Discharge Planning Services: CM Consult Post Acute Care Choice: [place] Living arrangements for the past 2 months: Single Family Home                                       Social Determinants of Health (SDOH) Interventions    Readmission Risk Interventions Readmission Risk Prevention Plan 12/13/2019 12/12/2019  Transportation Screening Complete Complete  PCP or Specialist Appt within 3-5 Days (No Data) -  HRI or [place] (No Data) -  Palliative Care Screening Complete Complete  Medication Review (RN Care Manager) Complete Complete  Some recent data might be hidden
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
## 2020-04-18 NOTE — Progress Notes (Signed)
OT Cancellation Note  Patient Details Name: Douglas Peterson. MRN: [id number] DOB: September 11, 1951   Cancelled Treatment:    Reason Eval/Treat Not Completed: Other (comment)  Pt asleep with covers over head when OT presents for treatment. He does awaken to therapist's voice, but politely declines treatment at this time citing fatigue. Pt requests that OT come back after lunch. Will f/u at later date/time as able for OT treatment. Thank you.  Gerrianne Scale, [place], OTR/L ascom [phone] 04/18/20, 12:37 PM

## 2020-04-18 NOTE — Progress Notes (Signed)
Pt complaining of pain around mid-line insertion site that travels up the arm. IV team consult put in. Waiting on them to assess.

## 2020-04-19 MED ORDER — ENSURE ENLIVE PO LIQD: 237.0000 mL | Freq: Two times a day (BID) | ORAL | 12 refills | Status: DC
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
## 2020-04-19 NOTE — Progress Notes (Signed)
Patient ID: Douglas Pesa., male   DOB: Mar 07, 1951, 69 y.o.   MRN: [id number]  Informed by case manager insurance did not approve for rehab. Patient will go home with home health.

## 2020-04-19 NOTE — Care Management Important Message (Signed)
Important Message  Patient Details  Name: Douglas Peterson. MRN: [id number] Date of Birth: 23-May-1951   Medicare Important Message Given:  Yes     Juliann Pulse A  04/19/2020, 11:03 AM
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
## 2020-04-30 ENCOUNTER — Emergency Department
Admission: EM | Admit: 2020-04-30 | Discharge: 2020-05-02 | Disposition: A | Discharge status: Home / Self Care | Payer: Medicare HMO | Attending: Emergency Medicine | Admitting: Emergency Medicine

## 2020-04-30 ENCOUNTER — Encounter: Payer: Self-pay | Admitting: Emergency Medicine

## 2020-04-30 ENCOUNTER — Other Ambulatory Visit: Payer: Self-pay

## 2020-04-30 DIAGNOSIS — R531 Weakness: Secondary | ICD-10-CM | POA: Diagnosis not present

## 2020-04-30 DIAGNOSIS — R296 Repeated falls: Secondary | ICD-10-CM | POA: Insufficient documentation

## 2020-04-30 DIAGNOSIS — R2681 Unsteadiness on feet: Secondary | ICD-10-CM | POA: Insufficient documentation

## 2020-04-30 DIAGNOSIS — M6281 Muscle weakness (generalized): Secondary | ICD-10-CM | POA: Insufficient documentation

## 2020-04-30 DIAGNOSIS — I10 Essential (primary) hypertension: Secondary | ICD-10-CM | POA: Insufficient documentation

## 2020-04-30 DIAGNOSIS — F1020 Alcohol dependence, uncomplicated: Secondary | ICD-10-CM | POA: Insufficient documentation

## 2020-04-30 DIAGNOSIS — R262 Difficulty in walking, not elsewhere classified: Secondary | ICD-10-CM | POA: Diagnosis not present

## 2020-04-30 HISTORY — DX: Pressure ulcer of unspecified buttock, unspecified stage: L89.309

## 2020-04-30 LAB — CBC WITH DIFFERENTIAL/PLATELET
Abs Immature Granulocytes: 0.01 10*3/uL (ref 0.00–0.07)
Basophils Absolute: 0.1 10*3/uL (ref 0.0–0.1)
Basophils Relative: 1 %
Eosinophils Absolute: 0.1 10*3/uL (ref 0.0–0.5)
Eosinophils Relative: 1 %
HCT: 44.2 % (ref 39.0–52.0)
Hemoglobin: 15.2 g/dL (ref 13.0–17.0)
Immature Granulocytes: 0 %
Lymphocytes Relative: 68 %
Lymphs Abs: 5.6 10*3/uL — ABNORMAL HIGH (ref 0.7–4.0)
MCH: 30.8 pg (ref 26.0–34.0)
MCHC: 34.4 g/dL (ref 30.0–36.0)
MCV: 89.7 fL (ref 80.0–100.0)
Monocytes Absolute: 0.5 10*3/uL (ref 0.1–1.0)
Monocytes Relative: 6 %
Neutro Abs: 1.9 10*3/uL (ref 1.7–7.7)
Neutrophils Relative %: 24 %
Platelets: 348 10*3/uL (ref 150–400)
RBC: 4.93 MIL/uL (ref 4.22–5.81)
RDW: 12.8 % (ref 11.5–15.5)
WBC: 8.2 10*3/uL (ref 4.0–10.5)
nRBC: 0 % (ref 0.0–0.2)

## 2020-04-30 LAB — COMPREHENSIVE METABOLIC PANEL
ALT: 91 U/L — ABNORMAL HIGH (ref 0–44)
AST: 103 U/L — ABNORMAL HIGH (ref 15–41)
Albumin: 4.4 g/dL (ref 3.5–5.0)
Alkaline Phosphatase: 98 U/L (ref 38–126)
Anion gap: 15 (ref 5–15)
BUN: <5 mg/dL — ABNORMAL LOW (ref 8–23)
CO2: 27 mmol/L (ref 22–32)
Calcium: 8.7 mg/dL — ABNORMAL LOW (ref 8.9–10.3)
Chloride: 94 mmol/L — ABNORMAL LOW (ref 98–111)
Creatinine, Ser: 0.52 mg/dL — ABNORMAL LOW (ref 0.61–1.24)
GFR calc Af Amer: >60 mL/min (ref >60)
GFR calc non Af Amer: >60 mL/min (ref >60)
Glucose, Bld: 97 mg/dL (ref 70–99)
Potassium: 4.4 mmol/L (ref 3.5–5.1)
Sodium: 136 mmol/L (ref 135–145)
Total Bilirubin: 0.9 mg/dL (ref 0.3–1.2)
Total Protein: 7.9 g/dL (ref 6.5–8.1)

## 2020-04-30 LAB — URINALYSIS, COMPLETE (UACMP) WITH MICROSCOPIC
Bacteria, UA: NONE SEEN
Bilirubin Urine: NEGATIVE
Glucose, UA: NEGATIVE mg/dL
Hgb urine dipstick: NEGATIVE
Ketones, ur: NEGATIVE mg/dL
Leukocytes,Ua: NEGATIVE
Nitrite: NEGATIVE
Protein, ur: NEGATIVE mg/dL
Specific Gravity, Urine: 1.005 (ref 1.005–1.030)
pH: 6 (ref 5.0–8.0)

## 2020-04-30 LAB — CK: Total CK: 73 U/L (ref 49–397)

## 2020-04-30 LAB — ETHANOL: Alcohol, Ethyl (B): 204 mg/dL — ABNORMAL HIGH (ref <10)

## 2020-04-30 MED ORDER — LORAZEPAM 2 MG/ML IJ SOLN
1.0000 mg | Freq: Once | INTRAMUSCULAR | Status: AC
  Administered 2020-04-30: 1 mg via INTRAVENOUS
  Filled 2020-04-30: qty 1

## 2020-04-30 MED ORDER — SODIUM CHLORIDE 0.9 % IV SOLN
1000.0000 mL | Freq: Once | INTRAVENOUS | Status: AC
  Administered 2020-04-30: 1000 mL via INTRAVENOUS

## 2020-04-30 NOTE — ED Notes (Signed)
Unsuccessful IV attempt by this RN and Judson Roch, RN. IV team consult ordered.

## 2020-04-30 NOTE — ED Provider Notes (Signed)
-----------------------------------------   11:03 PM on 04/30/2020 -----------------------------------------  Assuming care from Dr. Jimmye Norman.  In short, Douglas Peterson. is a 69 y.o. male with a chief complaint of fall.  Refer to the original H&P for additional details.  The current plan of care is to reassess after labs.  If medically indicated, may need hospitalization.  Otherwise will need social work consult and placement.   ----------------------------------------- 12:25 AM on 05/01/2020 -----------------------------------------  The patient's work-up is generally reassuring.  Ethanol level is 204.  Comprehensive metabolic panel is essentially normal for this patient including a mild elevation of liver function tests attributable to his chronic alcohol abuse.  Urinalysis is unremarkable.  Vital signs remained stable.  Anticipate social work consultation and placement assistance as he is unable to care for himself but does not meet criteria for inpatient hospitalization and treatment.  I ordered thiamine 100 mg IV.     (Note that documentation was delayed due to multiple ED patients requiring immediate care.)  The patient became tachycardic and tremulous overnight and per CIWA protocol he was given Ativan 2 mg IV.  He is resting again and awaiting social work consultation for disposition assistance.   Hinda Kehr, MD 05/01/20 [phone]

## 2020-04-30 NOTE — ED Notes (Signed)
Pt states that he gets around with a cane sometimes for extra support. Pt was suppose to be placed at a SNF but insurance denied.

## 2020-04-30 NOTE — ED Triage Notes (Signed)
Pt via EMS from home. Pt called out for a fall. Pt states that he had been in the floor for a couple hours. Pt states he feels he is dehydrated he hasn't had an appetite recently. Pt hasn't been eating or drinking anything except drinking alcohol. Pt last drink was a couple hours ago. Pt recently d/c from [facility] on the 18th for sepsis. Pt is A&Ox4 and NAD. Pt covered in feces on arrival. Pt is clean and dry at this time. Redness to pt's bilateral buttocks noted and foam dressing placed on pt at this time. Pt denies pain at this time.
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
## 2020-04-30 NOTE — ED Notes (Signed)
IV team at bedside 

## 2020-05-01 MED ORDER — LORAZEPAM 2 MG PO TABS
0.0000 mg | ORAL_TABLET | Freq: Four times a day (QID) | ORAL | Status: DC
  Administered 2020-05-01: 2 mg via ORAL
  Administered 2020-05-02: 1 mg via ORAL
  Filled 2020-05-01 (×2): qty 1

## 2020-05-01 MED ORDER — THIAMINE HCL 100 MG/ML IJ SOLN
100.0000 mg | Freq: Once | INTRAMUSCULAR | Status: AC
  Administered 2020-05-01: 100 mg via INTRAVENOUS
  Filled 2020-05-01: qty 2

## 2020-05-01 MED ORDER — BACLOFEN 10 MG PO TABS
10.0000 mg | ORAL_TABLET | Freq: Three times a day (TID) | ORAL | Status: DC
  Administered 2020-05-02: 10 mg via ORAL
  Filled 2020-05-01 (×4): qty 1

## 2020-05-01 MED ORDER — LORAZEPAM 2 MG/ML IJ SOLN: 0.0000 mg | Freq: Two times a day (BID) | INTRAMUSCULAR | Status: DC

## 2020-05-01 MED ORDER — ATENOLOL 25 MG PO TABS
25.0000 mg | ORAL_TABLET | Freq: Two times a day (BID) | ORAL | Status: DC
  Administered 2020-05-01 – 2020-05-02 (×2): 25 mg via ORAL
  Filled 2020-05-01 (×2): qty 1

## 2020-05-01 MED ORDER — PANTOPRAZOLE SODIUM 20 MG PO TBEC
20.0000 mg | DELAYED_RELEASE_TABLET | Freq: Every day | ORAL | Status: DC
  Administered 2020-05-01 – 2020-05-02 (×2): 20 mg via ORAL
  Filled 2020-05-01 (×2): qty 1

## 2020-05-01 MED ORDER — THIAMINE HCL 100 MG/ML IJ SOLN: 100.0000 mg | Freq: Every day | INTRAMUSCULAR | Status: DC

## 2020-05-01 MED ORDER — FOLIC ACID 1 MG PO TABS
1.0000 mg | ORAL_TABLET | Freq: Every day | ORAL | Status: DC
  Administered 2020-05-01 – 2020-05-02 (×2): 1 mg via ORAL
  Filled 2020-05-01 (×2): qty 1

## 2020-05-01 MED ORDER — LORAZEPAM 2 MG/ML IJ SOLN
0.0000 mg | Freq: Four times a day (QID) | INTRAMUSCULAR | Status: DC
  Administered 2020-05-01 (×2): 2 mg via INTRAVENOUS
  Filled 2020-05-01: qty 1

## 2020-05-01 MED ORDER — LORAZEPAM 2 MG/ML IJ SOLN
INTRAMUSCULAR | Status: AC
  Filled 2020-05-01: qty 1

## 2020-05-01 MED ORDER — DOCUSATE SODIUM 100 MG PO CAPS
100.0000 mg | ORAL_CAPSULE | Freq: Two times a day (BID) | ORAL | Status: DC
  Administered 2020-05-01 – 2020-05-02 (×2): 100 mg via ORAL
  Filled 2020-05-01 (×2): qty 1

## 2020-05-01 MED ORDER — PAROXETINE HCL 30 MG PO TABS
30.0000 mg | ORAL_TABLET | Freq: Every day | ORAL | Status: DC
  Administered 2020-05-01 – 2020-05-02 (×2): 30 mg via ORAL
  Filled 2020-05-01 (×2): qty 1

## 2020-05-01 MED ORDER — ADULT MULTIVITAMIN W/MINERALS CH
1.0000 | ORAL_TABLET | Freq: Every day | ORAL | Status: DC
  Administered 2020-05-01 – 2020-05-02 (×2): 1 via ORAL
  Filled 2020-05-01 (×2): qty 1

## 2020-05-01 MED ORDER — THIAMINE HCL 100 MG PO TABS
100.0000 mg | ORAL_TABLET | Freq: Every day | ORAL | Status: DC
  Administered 2020-05-01 – 2020-05-02 (×2): 100 mg via ORAL
  Filled 2020-05-01 (×2): qty 1

## 2020-05-01 MED ORDER — LORAZEPAM 2 MG PO TABS: 0.0000 mg | ORAL_TABLET | Freq: Two times a day (BID) | ORAL | Status: DC

## 2020-05-01 MED ORDER — ENSURE ENLIVE PO LIQD: 237.0000 mL | Freq: Two times a day (BID) | ORAL | Status: DC

## 2020-05-01 NOTE — ED Notes (Signed)
Pt given decaf coffee at this time. Pt denies further needs, denies food. This RN tells patient that I will check on them in one hour but that if they have any urgent needs to press their call light.

## 2020-05-01 NOTE — ED Notes (Signed)
Patient given coffee. 

## 2020-05-01 NOTE — ED Notes (Signed)
Patient set up with breakfast tray

## 2020-05-01 NOTE — Evaluation (Signed)
Physical Therapy Evaluation Patient Details Name: Douglas Peterson. MRN: [id number] DOB: 11/20/1950 Today's Date: 05/01/2020   History of Present Illness  Pt is a 69 y.o. male with a history of alcohol abuse, atrial fibrillation, hypertension, hyponatremia, TBI, right leg weakness who presented to the ED s/p all with possible dehydration.  Patient states he fell on the floor and could not get up.  MD assessment includes ethanol level of 204, weakness, and mild elevation of LFTs with chronic alcohol abuse.    Clinical Impression  Pt pleasant and motivated to participate during the session but ultimately required extensive physical assistance with all functional tasks.  Pt was only able to stand with max A and required extensive +2 assist to prevent posterior LOB once in standing.  Pt presented with significant posterior lean that did not improved with anterior weight shifting activities.  Pt made several attempts to ambulate but was unable to do so safely with pt advancing his LE but not his trunk leading pt to being held up to prevent fall.   Pt would not be safe to return to his prior living situation at this time. Pt will benefit from PT services in a SNF setting upon discharge to safely address deficits listed in patient problem list for decreased caregiver assistance and eventual return to PLOF.     Follow Up Recommendations SNF    Equipment Recommendations  None recommended by PT    Recommendations for Other Services OT consult     Precautions / Restrictions Precautions Precautions: Fall Restrictions Weight Bearing Restrictions: No      Mobility  Bed Mobility Overal bed mobility: Needs Assistance Bed Mobility: Supine to Sit;Sit to Supine     Supine to sit: Min assist Sit to supine: Mod assist   General bed mobility comments: Min-mod A for BLE and trunk control  Transfers Overall transfer level: Needs assistance Equipment used: 2 person hand held assist Transfers: Sit  to/from Stand Sit to Stand: Max assist         General transfer comment: Constant +2 max A to prevent posterior LOB with max multi-modal cues for anterior weight shifting  Ambulation/Gait             General Gait Details: Unable  Stairs            Wheelchair Mobility    Modified Rankin (Stroke Patients Only)       Balance Overall balance assessment: Needs assistance Sitting-balance support: Feet supported Sitting balance-Leahy Scale: Fair     Standing balance support: Bilateral upper extremity supported Standing balance-Leahy Scale: Poor Standing balance comment: Constant +2 assist to prevent posterior LOB                             Pertinent Vitals/Pain Pain Assessment: No/denies pain    Home Living Family/patient expects to be discharged to:: Private residence Living Arrangements: Alone Available Help at Discharge: Family;Available PRN/intermittently Type of Home: House Home Access: Stairs to enter Entrance Stairs-Rails: None Entrance Stairs-Number of Steps: 3 with no rails or 5 with bilateral rails Home Layout: One level Home Equipment: Cane - single point;Cane - quad;Walker - 2 wheels;Tub bench;Grab bars - tub/shower;Shower seat      Prior Function Level of Independence: Independent with assistive device(s)         Comments: Mod Ind amb with a SPC limited community distances, frequent falls, drives, Ind with ADLs     Hand Dominance  Extremity/Trunk Assessment   Upper Extremity Assessment Upper Extremity Assessment: Generalized weakness;RUE deficits/detail RUE Deficits / Details: Chronic RUE weakness, tone, decreased finger ext ROM from TBI LUE Deficits / Details: WFL    Lower Extremity Assessment Lower Extremity Assessment: Generalized weakness;RLE deficits/detail RLE Deficits / Details: Chronic RLE weakness from TBI LLE Deficits / Details: WFL       Communication   Communication: No difficulties  Cognition  Arousal/Alertness: Awake/alert Behavior During Therapy: WFL for tasks assessed/performed Overall Cognitive Status: Within Functional Limits for tasks assessed                                        General Comments      Exercises Total Joint Exercises Ankle Circles/Pumps: AROM;Strengthening;Both;10 reps Quad Sets: Strengthening;Both;10 reps Gluteal Sets: Strengthening;Both;10 reps Heel Slides: Strengthening;Both;5 reps Hip ABduction/ADduction: Strengthening;Both;10 reps Long Arc Quad: Strengthening;Both;10 reps Knee Flexion: AROM;Strengthening;Both;10 reps Other Exercises Other Exercises: Pt/nursing education on R hand positioning to address finger flexion contractures   Assessment/Plan    PT Assessment Patient needs continued PT services  PT Problem List Decreased strength;Decreased range of motion;Decreased activity tolerance;Decreased balance;Decreased mobility;Decreased coordination       PT Treatment Interventions Gait training;Therapeutic activities;Therapeutic exercise;Balance training;Stair training;DME instruction;Functional mobility training;Patient/family education    PT Goals (Current goals can be found in the Care Plan section)  Acute Rehab PT Goals Patient Stated Goal: To get stronger PT Goal Formulation: With patient Time For Goal Achievement: 05/14/20 Potential to Achieve Goals: Fair    Frequency Min 2X/week   Barriers to discharge Decreased caregiver support;Inaccessible home environment      Co-evaluation               AM-PAC PT "6 Clicks" Mobility  Outcome Measure Help needed turning from your back to your side while in a flat bed without using bedrails?: A Lot Help needed moving from lying on your back to sitting on the side of a flat bed without using bedrails?: A Lot Help needed moving to and from a bed to a chair (including a wheelchair)?: A Lot Help needed standing up from a chair using your arms (e.g., wheelchair or  bedside chair)?: A Lot Help needed to walk in hospital room?: Total Help needed climbing 3-5 steps with a railing? : Total 6 Click Score: 10    End of Session Equipment Utilized During Treatment: Gait belt Activity Tolerance: Patient tolerated treatment well Patient left: with call bell/phone within reach;in bed;Other (comment) (B rails up in ER as pt was found) Nurse Communication: Mobility status PT Visit Diagnosis: Unsteadiness on feet (R26.81);Muscle weakness (generalized) (M62.81);Difficulty in walking, not elsewhere classified (R26.2);Repeated falls (R29.6)    Time: 8527-7824 PT Time Calculation (min) (ACUTE ONLY): 39 min   Charges:   PT Evaluation $PT Eval Moderate Complexity: 1 Mod PT Treatments $Therapeutic Exercise: 8-22 mins        D. Scott  PT, DPT 05/01/20, 3:02 PM

## 2020-05-01 NOTE — ED Notes (Signed)
Pt brief and chux changed at this time by this RN. Patient clean/dry at this time, external urinary catheter bag placed on patient and secured on leg with tape. Patient denies further needs, is comfortable in bed at this time. Patient calm and cooperative, interacting pleasantly with this RN. Will continue to monitor.

## 2020-05-01 NOTE — ED Notes (Signed)
Patient ate 75% of his meal tray and tolerated well. Patient laid back in the bed at this time, patient comfortable but requesting home meds. Dr. Joni Fears requested to put home med orders in at this time.

## 2020-05-01 NOTE — ED Notes (Signed)
EDP [place], notified of pts increased change regarding CIWA scale. Verbal order to give the 78m of Ativan as directed. [facility]

## 2020-05-01 NOTE — TOC Progression Note (Signed)
Transition of Care (TOC) - Progression Note    Patient Details  Name: Douglas Peterson. MRN: [id number] Date of Birth: Oct 25, 1951  Transition of Care [facility]) CM/SW Contact  Anselm Pancoast, RN Phone Number: 05/01/2020, 3:09 PM  Clinical Narrative:    Damaris Schooner to Gerald Stabs [facility] who confirmed [facility] had no availability. RN CM will discuss with patient and confirm his second choice is to return home. Patient lives home alone and is independent when not inebriated. Patient understands his alcoholism and does not want resources to stop.         Expected Discharge Plan and Services                                                 Social Determinants of Health (SDOH) Interventions    Readmission Risk Interventions Readmission Risk Prevention Plan 12/13/2019 12/12/2019  Transportation Screening Complete Complete  PCP or Specialist Appt within 3-5 Days (No Data) -  HRI or [place] (No Data) -  Palliative Care Screening Complete Complete  Medication Review (RN Care Manager) Complete Complete  Some recent data might be hidden

## 2020-05-01 NOTE — TOC Initial Note (Signed)
Transition of Care (TOC) - Initial/Assessment Note    Patient Details  Name: Douglas Peterson. MRN: [id number] Date of Birth: Oct 17, 1951  Transition of Care [facility]) CM/SW Contact:    Anselm Pancoast, RN Phone Number: 05/01/2020, 2:54 PM  Clinical Narrative:                 Spoke with patient at bedside-patient states he is willing to discharge to SNF [facility] only and if [facility] is not an option he would prefer to go home. RN CM discussed in detail with patient the many times patient has requested [facility] and decided to go home instead and questioned if patient was for sure committed to SNF. Patient confirmed request for SNF at [facility] only.         Patient Goals and CMS Choice        Expected Discharge Plan and Services                                                Prior Living Arrangements/Services                       Activities of Daily Living      Permission Sought/Granted                  Emotional Assessment              Admission diagnosis:  Dehydration Patient Active Problem List   Diagnosis Date Noted  . Abnormal LFTs 04/06/2020  . Fall at home, initial encounter 04/06/2020  . Rhabdomyolysis 04/06/2020  . Elevated lactic acid level 04/06/2020  . Sepsis ([place]) 03/17/2020  . Constipation 03/17/2020  . AF (paroxysmal atrial fibrillation) ([place]) 03/17/2020  . Protein-calorie malnutrition, severe 02/25/2020  . Left inguinal hernia 01/31/2020  . Encounter for competency evaluation   . GERD (gastroesophageal reflux disease) 01/17/2020  . Depression 01/17/2020  . Esophagitis   . Nausea without vomiting   . Weakness   . Tremor   . Pancytopenia ([place])   . Hypomagnesemia   . Alcoholic cirrhosis of liver without ascites ([place])   . Thrombocytopenia ([place])   . Non-traumatic rhabdomyolysis   . Alcohol withdrawal delirium ([place]) 12/08/2019  . Alcohol abuse   . Alcohol intoxication, uncomplicated ([place])   . 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 Hypokalemia 09/27/2019  . Dysphagia 09/27/2019  . Alcohol withdrawal delirium, acute, hyperactive ([place]) 09/21/2019  . Pressure injury of skin 08/15/2019  . Goals of care, counseling/discussion   . Palliative care by specialist   . Alcohol withdrawal ([place]) 03/20/2019  . Low back pain 11/15/2018  . Acute metabolic encephalopathy 47/42/5956  . Delirium tremens ([place]) 03/08/2018  . Malnutrition of moderate degree 11/24/2017  . HTN (hypertension) 09/16/2017  . Hypothermia 12/17/2016  . Alcohol use   . Diarrhea   . Hyponatremia 07/09/2015   PCP:  [name], MD Pharmacy:   [place], [place] - [place] [address] [place] [address] Phone: [phone] Fax: [phone]     Social Determinants of Health (SDOH) Interventions    Readmission Risk Interventions Readmission Risk Prevention Plan 12/13/2019 12/12/2019  Transportation Screening Complete Complete  PCP or Specialist Appt within 3-5 Days (No Data) -  HRI or [place] (No Data) -  Palliative  Care Screening Complete Complete  Medication Review (RN Care Manager) Complete Complete  Some recent data might be hidden

## 2020-05-01 NOTE — ED Notes (Addendum)
Pt sheets and gown changed at this time, patient put in new brief that is clean/dry at this time. Patient comfortable in bed, no further needs noted.

## 2020-05-01 NOTE — ED Notes (Signed)
Pt given lunch tray at this time, patient requests ice cream so this RN gave patient ice cream. Patient calm and cooperative, no further needs noted. No agitation noted. Will continue to monitor.

## 2020-05-01 NOTE — ED Notes (Signed)
PAtient given coffee

## 2020-05-01 NOTE — ED Notes (Signed)
Pt given dinner tray at this time, patient sat up in bed and is comfortable. Patient denies further needs, will continue to monitor.

## 2020-05-02 ENCOUNTER — Other Ambulatory Visit: Payer: Self-pay

## 2020-05-02 ENCOUNTER — Encounter: Payer: Self-pay | Admitting: *Deleted

## 2020-05-02 ENCOUNTER — Emergency Department
Admission: EM | Admit: 2020-05-02 | Discharge: 2020-05-02 | Discharge status: Against Medical Advice | Payer: Medicare HMO | Source: Home / Self Care

## 2020-05-02 LAB — BASIC METABOLIC PANEL
Anion gap: 12 (ref 5–15)
BUN: 6 mg/dL — ABNORMAL LOW (ref 8–23)
CO2: 24 mmol/L (ref 22–32)
Calcium: 9.1 mg/dL (ref 8.9–10.3)
Chloride: 101 mmol/L (ref 98–111)
Creatinine, Ser: 0.48 mg/dL — ABNORMAL LOW (ref 0.61–1.24)
GFR calc Af Amer: >60 mL/min (ref >60)
GFR calc non Af Amer: >60 mL/min (ref >60)
Glucose, Bld: 115 mg/dL — ABNORMAL HIGH (ref 70–99)
Potassium: 3.4 mmol/L — ABNORMAL LOW (ref 3.5–5.1)
Sodium: 137 mmol/L (ref 135–145)

## 2020-05-02 LAB — CBC
HCT: 40.8 % (ref 39.0–52.0)
Hemoglobin: 14.4 g/dL (ref 13.0–17.0)
MCH: 31.1 pg (ref 26.0–34.0)
MCHC: 35.3 g/dL (ref 30.0–36.0)
MCV: 88.1 fL (ref 80.0–100.0)
Platelets: 201 10*3/uL (ref 150–400)
RBC: 4.63 MIL/uL (ref 4.22–5.81)
RDW: 12.4 % (ref 11.5–15.5)
WBC: 8.7 10*3/uL (ref 4.0–10.5)
nRBC: 0 % (ref 0.0–0.2)

## 2020-05-02 LAB — URINALYSIS, COMPLETE (UACMP) WITH MICROSCOPIC
Bacteria, UA: NONE SEEN
Bilirubin Urine: NEGATIVE
Glucose, UA: NEGATIVE mg/dL
Hgb urine dipstick: NEGATIVE
Ketones, ur: NEGATIVE mg/dL
Leukocytes,Ua: NEGATIVE
Nitrite: NEGATIVE
Protein, ur: 30 mg/dL — AB
Specific Gravity, Urine: 1.025 (ref 1.005–1.030)
pH: 6 (ref 5.0–8.0)

## 2020-05-02 LAB — TROPONIN I (HIGH SENSITIVITY): Troponin I (High Sensitivity): 5 ng/L (ref <18)

## 2020-05-02 LAB — ETHANOL: Alcohol, Ethyl (B): <10 mg/dL

## 2020-05-02 NOTE — ED Notes (Signed)
PT is at patient bedside at this time conducting assessment.

## 2020-05-02 NOTE — TOC Transition Note (Signed)
Transition of Care [facility]) - CM/SW Discharge Note   Patient Details  Name: Douglas Peterson. MRN: [id number] Date of Birth: [birth year]  Transition of Care [facility]) CM/SW Contact:  Anselm Pancoast, RN Phone Number: 05/02/2020, 3:10 PM   Clinical Narrative:    Spoke to EDP-[place] and discussed options of home health vs Hospice. Unable to schedule home health due to insurance.   RN CM spoke to Dianna with [facility] about possible evaluation. Dianna states she will work on accepting patient and follow up with patient at his house.   RN CM spoke with patient at bedside and confirmed patient is in agreeance with home and Hospice evaluation through [facility]. Patient also states that he is considering paying for aides to come to his home and assist.  Updated EDP and EDR[facility] of [place] completing evaluation at discharge.          Patient Goals and CMS Choice        Discharge Placement                       Discharge Plan and Services                                     Social Determinants of Health (SDOH) Interventions     Readmission Risk Interventions Readmission Risk Prevention Plan 12/13/2019 12/12/2019  Transportation Screening Complete Complete  PCP or Specialist Appt within 3-5 Days (No Data) -  HRI or [place] (No Data) -  Palliative Care Screening Complete Complete  Medication Review (RN Care Manager) Complete Complete  Some recent data might be hidden

## 2020-05-02 NOTE — Evaluation (Signed)
Occupational Therapy Evaluation Patient Details Name: Douglas Peterson. MRN: [id number] DOB: Mar 13, 1951 Today's Date: 05/02/2020    History of Present Illness Pt is a 69 y.o. male with a history of alcohol abuse, atrial fibrillation, hypertension, hyponatremia, TBI, right leg weakness who presented to the ED s/p all with possible dehydration.  Patient states he fell on the floor and could not get up.  MD assessment includes ethanol level of 204, weakness, and mild elevation of LFTs with chronic alcohol abuse.   Clinical Impression   Mr. Negron was seen for OT evaluation this date. Pt received in the ED and is well known to OT services from past admissions. He reports that prior to this admission he was living at home, alone, using a [facility] for functional mobility. Pt endorses multiple falls in past year, more recently he states falls have been due to bowel incontinence/diarreah. Currently pt demonstrates impairments as described below (See OT problem list) which functionally limit his ability to perform ADL/self-care tasks. Pt currently requires MOD-MAX A for LB ADL and +2 MOD A for functional mobility.  Pt would benefit from skilled OT services to address noted impairments and functional limitations (see below for any additional details) in order to maximize safety and independence while minimizing falls risk and caregiver burden. Upon hospital discharge, recommend STR to maximize pt safety and return to PLOF.      Follow Up Recommendations  SNF    Equipment Recommendations   (TBD)    Recommendations for Other Services       Precautions / Restrictions Precautions Precautions: Fall Restrictions Weight Bearing Restrictions: No      Mobility Bed Mobility Overal bed mobility: Needs Assistance Bed Mobility: Supine to Sit;Sit to Supine     Supine to sit: Min assist Sit to supine: Mod assist   General bed mobility comments: Min-mod A for BLE and trunk control  Transfers                  General transfer comment: Deferred for pt safety. Pt in high gurney bed, unable to touch floor with feet when sitting EOB.    Balance Overall balance assessment: Needs assistance Sitting-balance support: Feet unsupported;Bilateral upper extremity supported Sitting balance-Leahy Scale: Fair Sitting balance - Comments: Steady static sitting, reaching within BOS. Postural control: Posterior lean                                 ADL either performed or assessed with clinical judgement   ADL Overall ADL's : Needs assistance/impaired Eating/Feeding: Set up Eating/Feeding Details (indicate cue type and reason): setup required 2/2 diminished use of RUE Grooming: Sitting;Min guard;Minimal assistance;Cueing for safety   Upper Body Bathing: Moderate assistance;Sitting   Lower Body Bathing: Maximal assistance;+2 for physical assistance;Sit to/from stand   Upper Body Dressing : Moderate assistance   Lower Body Dressing: Sit to/from stand;+2 for physical assistance;Maximal assistance   Toilet Transfer: +2 for physical assistance;BSC;Moderate assistance   Toileting- Clothing Manipulation and Hygiene: Sit to/from stand;Moderate assistance;+2 for physical assistance               Vision Baseline Vision/History: No visual deficits Patient Visual Report: No change from baseline       Perception     Praxis      Pertinent Vitals/Pain Pain Assessment: No/denies pain     Hand Dominance Left   Extremity/Trunk Assessment Upper Extremity Assessment Upper Extremity Assessment: Generalized weakness;RUE deficits/detail  RUE Deficits / Details: Chronic RUE weakness, tone, decreased finger ext ROM from TBI. RUE Coordination: decreased gross motor;decreased fine motor LUE Deficits / Details: WFL   Lower Extremity Assessment Lower Extremity Assessment: Generalized weakness;Defer to PT evaluation RLE Deficits / Details: Chronic RLE weakness from TBI        Communication Communication Communication: No difficulties   Cognition Arousal/Alertness: Awake/alert;Lethargic Behavior During Therapy: WFL for tasks assessed/performed Overall Cognitive Status: Within Functional Limits for tasks assessed                                 General Comments: Pt oriented to self, place, and basic situation. Follows 1-step VCs consistently.   General Comments       Exercises Other Exercises Other Exercises: OT facilitates passive ROM/slow gentle stretch of RUE including elbow, wrist, hand, and fingers to maximize RUE functional use and skin integrity. Pt noted with tightly fisted rolled thin cloth in hand which had collapsed and no longer provided support. OT provides pt with rolled wash cloth to support functional opening of hand and finger extension.   Shoulder Instructions      Home Living Family/patient expects to be discharged to:: Private residence Living Arrangements: Alone Available Help at Discharge: Family;Available PRN/intermittently Type of Home: House Home Access: Stairs to enter [employer] of Steps: 3 with no rails or 5 with bilateral rails Entrance Stairs-Rails: None Home Layout: One level     Bathroom Shower/Tub: Teacher, early years/pre: Standard     Home Equipment: Cane - single point;Cane - quad;Walker - 2 wheels;Tub bench;Grab bars - tub/shower;Shower seat          Prior Functioning/Environment Level of Independence: Independent with assistive device(s)        Comments: Mod Ind amb with a SPC limited community distances, frequent falls, drives, Ind with ADLs        OT Problem List: Decreased strength;Decreased knowledge of use of DME or AE;Decreased activity tolerance;Decreased range of motion;Impaired UE functional use;Decreased safety awareness;Decreased coordination;Impaired balance (sitting and/or standing)      OT Treatment/Interventions: Self-care/ADL training;Therapeutic  exercise;Patient/family education;Energy conservation;Neuromuscular education;Therapeutic activities;DME and/or AE instruction;Balance training    OT Goals(Current goals can be found in the care plan section) Acute Rehab OT Goals Patient Stated Goal: To get stronger OT Goal Formulation: With patient Time For Goal Achievement: 05/16/20 Potential to Achieve Goals: Good ADL Goals Pt Will Perform Grooming: with modified independence (c LRAD PRN for improved safety and fxl independence.) Pt Will Transfer to Toilet: ambulating;with set-up;with supervision;bedside commode;regular height toilet (c LRAD PRN for improved safety and fxl independence.) Pt Will Perform Toileting - Clothing Manipulation and hygiene: sit to/from stand;with set-up;with supervision (c LRAD PRN for improved safety and fxl independence.) Additional ADL Goal #1: Pt will independent verbalize a plan to implement 3 falls prevention strategies into his daily rotuines/home environment for improved safety and fxl independence upon hospital DC.  OT Frequency: Min 1X/week   Barriers to D/C: Decreased caregiver support          Co-evaluation              AM-PAC OT "6 Clicks" Daily Activity     Outcome Measure Help from another person eating meals?: A Little Help from another person taking care of personal grooming?: A Little Help from another person toileting, which includes using toliet, bedpan, or urinal?: A Lot Help from another person bathing (including washing,  rinsing, drying)?: A Lot Help from another person to put on and taking off regular upper body clothing?: A Little Help from another person to put on and taking off regular lower body clothing?: A Lot 6 Click Score: 15   End of Session    Activity Tolerance: Patient tolerated treatment well Patient left: with call bell/phone within reach;in bed (In ED gurney bed with bed rails up.)  OT Visit Diagnosis: Unsteadiness on feet (R26.81)                Time:  1034-1100 OT Time Calculation (min): 26 min Charges:  OT General Charges $OT Visit: 1 Visit OT Evaluation $OT Eval Moderate Complexity: 1 Mod OT Treatments $Self Care/Home Management : 8-22 mins $Therapeutic Exercise: 8-22 mins  Shara Blazing, M.S., OTR/L Ascom: [phone] 05/02/20, 12:48 PM

## 2020-05-02 NOTE — Progress Notes (Signed)
Physical Therapy Treatment Patient Details Name: Douglas Peterson. MRN: [id number] DOB: 1951/10/24 Today's Date: 05/02/2020    History of Present Illness Pt is a 69 y.o. male with a history of alcohol abuse, atrial fibrillation, hypertension, hyponatremia, TBI, right leg weakness who presented to the ED s/p all with possible dehydration.  Patient states he fell on the floor and could not get up.  MD assessment includes ethanol level of 204, weakness, and mild elevation of LFTs with chronic alcohol abuse.    PT Comments    Pt pleasant and motivated to participate during the session and made some progress towards goals this session.  Pt was able to take several steps from a bed to a chair and then back to bed but required near constant physical assistance to prevent LOB, most notably during turns.  Pt was able to ascend and descend 1 step with a SPC but required substantial physical assist to do so and to prevent LOB.  Pt is a very high risk of falls and would not be safe to return to his prior living situation at this time.  Pt will benefit from PT services in a SNF setting upon discharge to safely address deficits listed in patient problem list for decreased caregiver assistance and eventual return to PLOF.     Follow Up Recommendations  SNF     Equipment Recommendations  None recommended by PT    Recommendations for Other Services       Precautions / Restrictions Precautions Precautions: Fall Restrictions Weight Bearing Restrictions: No    Mobility  Bed Mobility Overal bed mobility: Needs Assistance Bed Mobility: Supine to Sit;Sit to Supine     Supine to sit: Min assist Sit to supine: Min assist   General bed mobility comments: Min A for BLE and trunk control  Transfers Overall transfer level: Needs assistance Equipment used: Straight cane Transfers: Sit to/from Stand Sit to Stand: Mod assist         General transfer comment: Mod A to stand and then occasional min  to mod A for stability  Ambulation/Gait Ambulation/Gait assistance: Min assist;Mod assist Gait Distance (Feet): 4 Feet x 2 Assistive device: Straight cane Gait Pattern/deviations: Step-through pattern;Decreased step length - right;Decreased step length - left;Trunk flexed;Shuffle Gait velocity: decreased    General Gait Details: Slow, effortful, shuffling steps with min to mod A for stability   Stairs Stairs: Yes Stairs assistance: Mod assist;Max assist Stair Management: Forwards;With cane Number of Stairs: 1 General stair comments: Mod to max A to prevent LOB during stair trianing with mod verbal cues for sequencing   Wheelchair Mobility    Modified Rankin (Stroke Patients Only)       Balance Overall balance assessment: Needs assistance Sitting-balance support: Feet unsupported;Bilateral upper extremity supported Sitting balance-Leahy Scale: Fair Sitting balance - Comments: Steady static sitting, reaching within BOS. Postural control: Posterior lean Standing balance support: Single extremity supported;During functional activity Standing balance-Leahy Scale: Poor Standing balance comment: Physical assistance required to prevent LOB in standing and during functional activity                            Cognition Arousal/Alertness: Awake/alert Behavior During Therapy: WFL for tasks assessed/performed Overall Cognitive Status: Within Functional Limits for tasks assessed                                 General Comments:  Pt oriented to self, place, and basic situation. Follows 1-step VCs consistently.      Exercises Total Joint Exercises Ankle Circles/Pumps: AROM;Strengthening;Both;10 reps Quad Sets: Strengthening;Both;10 reps Gluteal Sets: Strengthening;Both;10 reps Heel Slides: Strengthening;Both;5 reps Hip ABduction/ADduction: Strengthening;Both;10 reps Long Arc Quad: Strengthening;Both;10 reps Knee Flexion: AROM;Strengthening;Both;10  reps Other Exercises Other Exercises: OT facilitates passive ROM/slow gentle stretch of RUE including elbow, wrist, hand, and fingers to maximize RUE functional use and skin integrity. Pt noted with tightly fisted rolled thin cloth in hand which had collapsed and no longer provided support. OT provides pt with rolled wash cloth to support functional opening of hand and finger extension.    General Comments        Pertinent Vitals/Pain Pain Assessment: No/denies pain    Home Living                      Prior Function            PT Goals (current goals can now be found in the care plan section) Acute Rehab PT Goals Patient Stated Goal: To get stronger Progress towards PT goals: Progressing toward goals    Frequency    Min 2X/week      PT Plan Current plan remains appropriate    Co-evaluation              AM-PAC PT "6 Clicks" Mobility   Outcome Measure  Help needed turning from your back to your side while in a flat bed without using bedrails?: A Lot Help needed moving from lying on your back to sitting on the side of a flat bed without using bedrails?: A Lot Help needed moving to and from a bed to a chair (including a wheelchair)?: A Lot Help needed standing up from a chair using your arms (e.g., wheelchair or bedside chair)?: A Lot Help needed to walk in hospital room?: Total Help needed climbing 3-5 steps with a railing? : Total 6 Click Score: 10    End of Session Equipment Utilized During Treatment: Gait belt Activity Tolerance: Patient tolerated treatment well Patient left: with call bell/phone within reach;in bed;Other (comment) (Bil rails up in ER as pt found) Nurse Communication: Mobility status PT Visit Diagnosis: Unsteadiness on feet (R26.81);Muscle weakness (generalized) (M62.81);Difficulty in walking, not elsewhere classified (R26.2);Repeated falls (R29.6)     Time: 1415-1440 PT Time Calculation (min) (ACUTE ONLY): 25 min  Charges:   $Gait Training: 8-22 mins $Therapeutic Exercise: 8-22 mins                     D. Scott  PT, DPT 05/02/20, 4:17 PM

## 2020-05-02 NOTE — ED Notes (Signed)
[name] now going by EMS.

## 2020-05-02 NOTE — ED Notes (Signed)
Patient has been informed that he is receiving a Cab Voucher and that this is a one time thing given the exact situation the patient is currently in.  RN also confirmed that patient stated he will have neighbor ready to help patient out of Taxi or to grab cane for patient to get out of Taxi and ambulate.

## 2020-05-02 NOTE — TOC Transition Note (Addendum)
Transition of Care [facility]) - CM/SW Discharge Note   Patient Details  Name: Douglas Peterson. MRN: [id number] Date of Birth: 03-Feb-1951  [facility] of [facility]) CM/SW Contact:  Anselm Pancoast, RN Phone Number: 05/02/2020, 3:58 PM   Clinical Narrative:    Received call from Bennett @ [facility] states patient was not accepted by Hospice however information has been sent to [place] for [facility] possibly picking patient up.  4:50pm: Confirmed with [facility] will accept patient for home health services.          Patient Goals and CMS Choice        Discharge Placement                       Discharge Plan and Services                                     Social Determinants of Health (SDOH) Interventions     Readmission Risk Interventions Readmission Risk Prevention Plan 12/13/2019 12/12/2019  Transportation Screening Complete Complete  PCP or Specialist Appt within 3-5 Days (No Data) -  HRI or [place] (No Data) -  Palliative Care Screening Complete Complete  Medication Review (RN Care Manager) Complete Complete  Some recent data might be hidden

## 2020-05-02 NOTE — ED Notes (Signed)
RN has spoken to sister who states patient is lying about having neighbor help patient out of taxi. RN has also updated patient sister on patient refusing to be admitted, refusing to transfer to a SNF and refusing acute care rehabilitation.

## 2020-05-02 NOTE — ED Triage Notes (Signed)
Pt to ED reporting increased weakness and "jitteriness" pt reports he drinks 18 beers a day and has not been drinking since yesterday. Pt requesting detox.

## 2020-05-02 NOTE — ED Provider Notes (Addendum)
Patient seen by OT, who initially recommended SNF placement. However, pt refuses SNF placement and is requesting discharge. He is well known to our social work team and often refuses SNF placement or leaves shortly after arriving. He is now awake, alert, and ambulatory. I confirmed with Andreas Newport that pt has safe plan for discharge and neighbors that can assist him. I have also placed face-to-face referral for PT/OT/nursing. He is calm, not tremulous, with no signs of withdrawal. He is amenable to this plan.   Duffy Bruce, MD 05/02/20 1408    Duffy Bruce, MD 05/02/20 [phone]

## 2020-05-03 ENCOUNTER — Telehealth: Payer: Self-pay | Admitting: Emergency Medicine

## 2020-05-03 NOTE — Telephone Encounter (Signed)
Called patient due to lwot to inquire about condition and follow up plans.No answer and voicemail is full.

## 2020-05-05 ENCOUNTER — Other Ambulatory Visit: Payer: Self-pay

## 2020-05-05 ENCOUNTER — Emergency Department
Admission: EM | Admit: 2020-05-05 | Discharge: 2020-05-11 | Disposition: A | Discharge status: Home / Self Care | Payer: Medicare HMO | Attending: Emergency Medicine | Admitting: Emergency Medicine

## 2020-05-05 DIAGNOSIS — Z20822 Contact with and (suspected) exposure to covid-19: Secondary | ICD-10-CM | POA: Diagnosis not present

## 2020-05-05 DIAGNOSIS — F10929 Alcohol use, unspecified with intoxication, unspecified: Secondary | ICD-10-CM | POA: Diagnosis present

## 2020-05-05 DIAGNOSIS — Z87891 Personal history of nicotine dependence: Secondary | ICD-10-CM | POA: Diagnosis not present

## 2020-05-05 DIAGNOSIS — Z87820 Personal history of traumatic brain injury: Secondary | ICD-10-CM | POA: Diagnosis not present

## 2020-05-05 DIAGNOSIS — F10939 Alcohol use, unspecified with withdrawal, unspecified: Secondary | ICD-10-CM | POA: Diagnosis present

## 2020-05-05 DIAGNOSIS — F10231 Alcohol dependence with withdrawal delirium: Secondary | ICD-10-CM | POA: Diagnosis present

## 2020-05-05 DIAGNOSIS — R2681 Unsteadiness on feet: Secondary | ICD-10-CM | POA: Diagnosis not present

## 2020-05-05 DIAGNOSIS — I10 Essential (primary) hypertension: Secondary | ICD-10-CM | POA: Insufficient documentation

## 2020-05-05 DIAGNOSIS — Y92009 Unspecified place in unspecified non-institutional (private) residence as the place of occurrence of the external cause: Secondary | ICD-10-CM

## 2020-05-05 DIAGNOSIS — R251 Tremor, unspecified: Secondary | ICD-10-CM | POA: Diagnosis present

## 2020-05-05 DIAGNOSIS — Z79899 Other long term (current) drug therapy: Secondary | ICD-10-CM | POA: Diagnosis not present

## 2020-05-05 DIAGNOSIS — Z8249 Family history of ischemic heart disease and other diseases of the circulatory system: Secondary | ICD-10-CM | POA: Diagnosis not present

## 2020-05-05 DIAGNOSIS — F1090 Alcohol use, unspecified, uncomplicated: Secondary | ICD-10-CM

## 2020-05-05 DIAGNOSIS — I480 Paroxysmal atrial fibrillation: Secondary | ICD-10-CM | POA: Diagnosis not present

## 2020-05-05 DIAGNOSIS — F1010 Alcohol abuse, uncomplicated: Secondary | ICD-10-CM | POA: Diagnosis present

## 2020-05-05 DIAGNOSIS — W19XXXA Unspecified fall, initial encounter: Secondary | ICD-10-CM

## 2020-05-05 DIAGNOSIS — R296 Repeated falls: Secondary | ICD-10-CM | POA: Diagnosis not present

## 2020-05-05 DIAGNOSIS — Z789 Other specified health status: Secondary | ICD-10-CM

## 2020-05-05 DIAGNOSIS — K7030 Alcoholic cirrhosis of liver without ascites: Secondary | ICD-10-CM | POA: Diagnosis present

## 2020-05-05 DIAGNOSIS — R531 Weakness: Secondary | ICD-10-CM | POA: Insufficient documentation

## 2020-05-05 DIAGNOSIS — F10129 Alcohol abuse with intoxication, unspecified: Secondary | ICD-10-CM | POA: Insufficient documentation

## 2020-05-05 DIAGNOSIS — Z7289 Other problems related to lifestyle: Secondary | ICD-10-CM

## 2020-05-05 DIAGNOSIS — F10931 Alcohol use, unspecified with withdrawal delirium: Secondary | ICD-10-CM | POA: Diagnosis present

## 2020-05-05 DIAGNOSIS — F10920 Alcohol use, unspecified with intoxication, uncomplicated: Secondary | ICD-10-CM

## 2020-05-05 DIAGNOSIS — F32A Depression, unspecified: Secondary | ICD-10-CM | POA: Diagnosis present

## 2020-05-05 DIAGNOSIS — G9341 Metabolic encephalopathy: Secondary | ICD-10-CM | POA: Diagnosis present

## 2020-05-05 LAB — URINALYSIS, COMPLETE (UACMP) WITH MICROSCOPIC
Bacteria, UA: NONE SEEN
Bilirubin Urine: NEGATIVE
Glucose, UA: NEGATIVE mg/dL
Hgb urine dipstick: NEGATIVE
Ketones, ur: NEGATIVE mg/dL
Leukocytes,Ua: NEGATIVE
Nitrite: NEGATIVE
Protein, ur: NEGATIVE mg/dL
Specific Gravity, Urine: 1.002 — ABNORMAL LOW (ref 1.005–1.030)
Squamous Epithelial / HPF: NONE SEEN (ref 0–5)
pH: 5 (ref 5.0–8.0)

## 2020-05-05 LAB — CBC
HCT: 40.8 % (ref 39.0–52.0)
Hemoglobin: 14.5 g/dL (ref 13.0–17.0)
MCH: 31 pg (ref 26.0–34.0)
MCHC: 35.5 g/dL (ref 30.0–36.0)
MCV: 87.4 fL (ref 80.0–100.0)
Platelets: 189 10*3/uL (ref 150–400)
RBC: 4.67 MIL/uL (ref 4.22–5.81)
RDW: 12.4 % (ref 11.5–15.5)
WBC: 8.1 10*3/uL (ref 4.0–10.5)
nRBC: 0 % (ref 0.0–0.2)

## 2020-05-05 LAB — BASIC METABOLIC PANEL
Anion gap: 13 (ref 5–15)
BUN: <5 mg/dL — ABNORMAL LOW (ref 8–23)
CO2: 26 mmol/L (ref 22–32)
Calcium: 8.5 mg/dL — ABNORMAL LOW (ref 8.9–10.3)
Chloride: 88 mmol/L — ABNORMAL LOW (ref 98–111)
Creatinine, Ser: 0.6 mg/dL — ABNORMAL LOW (ref 0.61–1.24)
GFR calc Af Amer: >60 mL/min (ref >60)
GFR calc non Af Amer: >60 mL/min (ref >60)
Glucose, Bld: 83 mg/dL (ref 70–99)
Potassium: 4 mmol/L (ref 3.5–5.1)
Sodium: 127 mmol/L — ABNORMAL LOW (ref 135–145)

## 2020-05-05 LAB — TROPONIN I (HIGH SENSITIVITY): Troponin I (High Sensitivity): 4 ng/L (ref <18)

## 2020-05-05 LAB — ETHANOL: Alcohol, Ethyl (B): 219 mg/dL — ABNORMAL HIGH (ref <10)

## 2020-05-05 LAB — SARS CORONAVIRUS 2 BY RT PCR (HOSPITAL ORDER, PERFORMED IN ~~LOC~~ HOSPITAL LAB): SARS Coronavirus 2: NEGATIVE

## 2020-05-05 MED ORDER — LORAZEPAM 2 MG/ML IJ SOLN
0.0000 mg | Freq: Two times a day (BID) | INTRAMUSCULAR | Status: AC
  Administered 2020-05-08 – 2020-05-09 (×2): 1 mg via INTRAVENOUS
  Filled 2020-05-05 (×2): qty 1

## 2020-05-05 MED ORDER — LORAZEPAM 2 MG/ML IJ SOLN
0.0000 mg | Freq: Four times a day (QID) | INTRAMUSCULAR | Status: AC
  Administered 2020-05-05 – 2020-05-06 (×2): 2 mg via INTRAVENOUS
  Administered 2020-05-06: 1 mg via INTRAVENOUS
  Filled 2020-05-05 (×3): qty 1

## 2020-05-05 MED ORDER — THIAMINE HCL 100 MG PO TABS
100.0000 mg | ORAL_TABLET | Freq: Every day | ORAL | Status: DC
  Administered 2020-05-05 – 2020-05-10 (×4): 100 mg via ORAL
  Filled 2020-05-05 (×5): qty 1

## 2020-05-05 MED ORDER — SODIUM CHLORIDE 0.9 % IV SOLN: Freq: Once | INTRAVENOUS | Status: AC

## 2020-05-05 MED ORDER — LORAZEPAM 2 MG PO TABS
0.0000 mg | ORAL_TABLET | Freq: Two times a day (BID) | ORAL | Status: AC
  Administered 2020-05-08: 2 mg via ORAL
  Filled 2020-05-05: qty 1

## 2020-05-05 MED ORDER — LORAZEPAM 2 MG PO TABS
0.0000 mg | ORAL_TABLET | Freq: Four times a day (QID) | ORAL | Status: AC
  Administered 2020-05-05: 1 mg via ORAL
  Administered 2020-05-05 – 2020-05-07 (×2): 2 mg via ORAL
  Filled 2020-05-05 (×3): qty 1

## 2020-05-05 MED ORDER — THIAMINE HCL 100 MG/ML IJ SOLN
100.0000 mg | Freq: Every day | INTRAMUSCULAR | Status: DC
  Administered 2020-05-06 – 2020-05-11 (×2): 100 mg via INTRAVENOUS
  Filled 2020-05-05 (×3): qty 2

## 2020-05-05 NOTE — ED Notes (Signed)
Pt dinner tray given with ginger ale.

## 2020-05-05 NOTE — ED Notes (Signed)
Pt given breakfast tray and decaf coffee.

## 2020-05-05 NOTE — ED Notes (Signed)
IV team at bedside 

## 2020-05-05 NOTE — ED Notes (Addendum)
Pt states coming in after drinking several hours ago. Pt states he is having weakness. Pt states he is okay with getting social work to get into a facility   RN attempted to get a 22G, right forearm. RN was unable to get. Catheter intact, bleeding controlled. Provider notified.
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
## 2020-05-05 NOTE — ED Notes (Signed)
RN called Lab. Lab states they will have phlebotomy try to get blood work

## 2020-05-05 NOTE — TOC Progression Note (Signed)
Transition of Care (TOC) - Progression Note    Patient Details  Name: Douglas Peterson. MRN: [id number] Date of Birth: [birth year]  Transition of Care [facility]) CM/SW [place], LCSW Phone Number: 05/05/2020, 9:36 AM  Clinical Narrative:    CSW received consult for patient for SNF placement - chart review reveals that patient is a frequent visitor to [facility] for falls and alcohol abuse. CSW spoke with patient who reports he lives alone and is agreeable for short term rehab at [facility]. Patient's preference is [facility] and states he does not want to go to another facility.   All of patient's questions were answered. CSW faxed patient's clinical information out for review at [facility].        Expected Discharge Plan and Services      Social Determinants of Health (SDOH) Interventions    Readmission Risk Interventions Readmission Risk Prevention Plan 12/13/2019 12/12/2019  Transportation Screening Complete Complete  PCP or Specialist Appt within 3-5 Days (No Data) -  [facility]RI or [facility] (No Data) -  Palliative Care Screening Complete Complete  Medication Review (RN Care Manager) Complete Complete  Some recent data might be hidden

## 2020-05-05 NOTE — ED Notes (Signed)
Lab at bedside to obtain bloodwork

## 2020-05-05 NOTE — ED Notes (Signed)
Pt noted to have urinated on himself. Clothes removed, pt cleaned and condom catheter placed with pts permission. New gown placed. Pt noted to have healed sore to the left foot. Pt repositioned in bed. Scrotum noted to be red, but intact.
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
## 2020-05-05 NOTE — ED Notes (Signed)
Lab was unable to get blood work. 2RNs have attempted and have been unable to get blood work. ER provider has been notified.
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
## 2020-05-05 NOTE — ED Notes (Signed)
External cath displaced on the pt. Pt pad, gown, and brief changed. External urinary cath replaced and foam dressing placed on pt's sacrum. Pt repositioned in the bed

## 2020-05-06 MED ORDER — ATENOLOL 25 MG PO TABS
25.0000 mg | ORAL_TABLET | Freq: Every day | ORAL | Status: DC
  Administered 2020-05-06 – 2020-05-11 (×6): 25 mg via ORAL
  Filled 2020-05-06 (×6): qty 1

## 2020-05-06 MED ORDER — ADULT MULTIVITAMIN W/MINERALS CH
1.0000 | ORAL_TABLET | Freq: Every day | ORAL | Status: DC
  Administered 2020-05-06 – 2020-05-11 (×6): 1 via ORAL
  Filled 2020-05-06 (×6): qty 1

## 2020-05-06 MED ORDER — VITAMIN D3 25 MCG (1000 UNIT) PO TABS
2000.0000 [IU] | ORAL_TABLET | Freq: Every day | ORAL | Status: DC
  Administered 2020-05-06 – 2020-05-11 (×8): 2000 [IU] via ORAL
  Filled 2020-05-06 (×12): qty 2

## 2020-05-06 MED ORDER — THIAMINE HCL 100 MG PO TABS
100.0000 mg | ORAL_TABLET | Freq: Every day | ORAL | Status: DC
  Administered 2020-05-08 – 2020-05-11 (×4): 100 mg via ORAL
  Filled 2020-05-06 (×3): qty 1

## 2020-05-06 MED ORDER — CALCIUM CARBONATE-VITAMIN D 500-200 MG-UNIT PO TABS
1.0000 | ORAL_TABLET | Freq: Every day | ORAL | Status: DC
  Administered 2020-05-07 – 2020-05-11 (×4): 1 via ORAL
  Filled 2020-05-06 (×5): qty 1

## 2020-05-06 MED ORDER — ENSURE ENLIVE PO LIQD
237.0000 mL | Freq: Two times a day (BID) | ORAL | Status: DC
  Administered 2020-05-09 (×2): 237 mL via ORAL

## 2020-05-06 MED ORDER — ASPIRIN EC 81 MG PO TBEC
81.0000 mg | DELAYED_RELEASE_TABLET | Freq: Every day | ORAL | Status: DC
  Administered 2020-05-06 – 2020-05-11 (×6): 81 mg via ORAL
  Filled 2020-05-06 (×6): qty 1

## 2020-05-06 MED ORDER — PAROXETINE HCL 20 MG PO TABS
20.0000 mg | ORAL_TABLET | Freq: Every day | ORAL | Status: DC
  Administered 2020-05-06 – 2020-05-11 (×6): 20 mg via ORAL
  Filled 2020-05-06 (×6): qty 1

## 2020-05-06 MED ORDER — FOLIC ACID 1 MG PO TABS
1.0000 mg | ORAL_TABLET | Freq: Every day | ORAL | Status: DC
  Administered 2020-05-06 – 2020-05-11 (×6): 1 mg via ORAL
  Filled 2020-05-06 (×6): qty 1

## 2020-05-06 MED ORDER — ASCORBIC ACID 500 MG PO TABS
1000.0000 mg | ORAL_TABLET | Freq: Every day | ORAL | Status: DC
  Administered 2020-05-06 – 2020-05-11 (×6): 1000 mg via ORAL
  Filled 2020-05-06 (×6): qty 2

## 2020-05-06 MED ORDER — POLYETHYLENE GLYCOL 3350 17 G PO PACK
17.0000 g | PACK | Freq: Every day | ORAL | Status: DC | PRN
  Administered 2020-05-09: 17 g via ORAL
  Filled 2020-05-06: qty 1

## 2020-05-06 NOTE — ED Notes (Signed)
Brief and chux changed, pt repositioned in bed and provided urinal.

## 2020-05-06 NOTE — Progress Notes (Signed)
CSW received call from [name] sister Douglas Peterson stating that although she is not currently local, she will work to get her daughter or perhaps another friend to gather clothing and key for Pt.  CSW will relay this information to Pt.

## 2020-05-06 NOTE — ED Notes (Addendum)
Pt stated that he no longer wants to be here. He told this RN and Theadora Rama, Agricultural consultant that he hasn't had any of his medications and that he was worried about his home. Explained to pt that normally when EMS picks up a pt they normally lock the door behind them. Also explained that we can get a pharmacy tech to reconcile pt's medications when they come in at 0800 so we can order his home medications.

## 2020-05-06 NOTE — ED Provider Notes (Signed)
Patient without distress.  Has met with social work team, currently pursuing placement [facility].  No acute issues during this shift.  Medications reconciled.  Home medications reordered  Patient alert, oriented.  Vitals:   05/06/20 1140 05/06/20 1254  BP: 140/75 (!) 156/100  Pulse: 79 78  Resp: 18 18  Temp:    SpO2: 98% 96%    Ongoing care assigned to Dr. Doree Albee, MD 05/06/20 1526

## 2020-05-06 NOTE — ED Notes (Signed)
Pt worried about his pain medications at this time. This RN tells patient that we're waiting on the doctor to put in his medications and that we would be getting him them as soon as possible. This RN gave patient decaf coffee and ice cream at this time per request. Patient calm and cooperative. This RN asks patient to keep quiet in the hall due to there being other patients. Patient states he will no longer yell out.

## 2020-05-06 NOTE — ED Notes (Signed)
Breakfast tray given with decaf coffee.

## 2020-05-06 NOTE — Social Work (Signed)
CSW met with Pt at bedside. Pt confirms that he believes that reconditioning rehab at [facility] would be his best course of treatment.  Pt requested that CSW reach out to his sister [name] @ [phone] to ask if Sandra's daughter might be able to bring Pt some clothing and his keys for his stay at [facility].  CSW attempted to call but there was no answer and mailbox is full.

## 2020-05-06 NOTE — ED Notes (Signed)
Pt cleaned and new gown applied

## 2020-05-06 NOTE — ED Notes (Signed)
Pt attempting to get out of bed, states he is bored and wants to go home. Pt reminded he is going to facility per his request in the morning. Pt directed back to bed.

## 2020-05-07 NOTE — ED Notes (Signed)
Pt requesting to be transported home via EMS. Pt is own guardian and A&O x4.

## 2020-05-07 NOTE — ED Notes (Signed)
Planning to d/c home. Pt unable to ambulate independently.

## 2020-05-07 NOTE — ED Notes (Signed)
[facility]  called  for transport  home

## 2020-05-07 NOTE — ED Notes (Signed)
EMS came to pick up pt. Pt unable to walk with and without a walker. Pt noted to have urinated on himself. Pt cleaned, new gown, linen and blankets provided. chux placed under pt. EMS unable to take pt as pt is unable to walk. Please see charge, RN note. Pt able to void in urinal 11m

## 2020-05-07 NOTE — ED Notes (Signed)
EMS to pick pt up. Pt provided with a walker and is unable to lift self from bed and unable to use walker without 2 assist. Pt lives alone, does not have family that is at the home or that can be at the home. Pt high fall risk and is unable to be discharged safely at this time. MD Ellender Hose and Beather Arbour made aware.

## 2020-05-07 NOTE — ED Provider Notes (Signed)
Procedures     ----------------------------------------- 1:03 PM on 05/07/2020 ----------------------------------------- Patient remains calm and comfortable in the ED.  He is eating.  Advised by social work no skilled facilities or home health agencies willing to accept patient.  Patient now requesting to be discharged despite inability to ambulate which is suspected to be chronic baseline related to severe alcoholism.  He is lucid and able to make medical decisions.     Carrie Mew, MD 05/07/20 1304

## 2020-05-07 NOTE — ED Notes (Signed)
EMS at bedside for pt

## 2020-05-07 NOTE — TOC Progression Note (Addendum)
Transition of Care (TOC) - Progression Note    Patient Details  Name: Douglas Peterson. MRN: [id number] Date of Birth: [birth year]  [facility] of [facility]) CM/SW Contact  Anselm Pancoast, RN Phone Number: 05/07/2020, 8:28 AM  Clinical Narrative:    Patient only willing to go to [facility] however SNF is not willing to accept patient back due to patient checks himself out or never arrives to facility as planned. Patient was denied SNF approval by insurance last 2 attempts. Patient has no options for home health due to insurance and agencies not willing to accept patient back into services due to history of noncompliance with home health plan.  Sent out alternate bed requests-will talk to patient regarding alternate options.  Confirmed with Gerald Stabs @ [facility] they are unable to make bed offer on patient.   Attempted to set up Hospice services at previous admission however patient denied by [facility] due to lack of ascites. Dianna with [facility] had attempted to set up services with [facility] and [facility] however unable to accept due to history of noncompliance with agencies.         Expected Discharge Plan and Services                                                 Social Determinants of Health (SDOH) Interventions    Readmission Risk Interventions Readmission Risk Prevention Plan 12/13/2019 12/12/2019  Transportation Screening Complete Complete  PCP or Specialist Appt within 3-5 Days (No Data) -  HRI or [place] (No Data) -  Palliative Care Screening Complete Complete  Medication Review (RN Care Manager) Complete Complete  Some recent data might be hidden

## 2020-05-07 NOTE — ED Notes (Signed)
Pt back in stretcher, both side rails up. Pt provided with snacks and drink to bedside.

## 2020-05-07 NOTE — ED Notes (Signed)
Pt stood up with Presenter, broadcasting. Pt unable to stand on own and unsteady on feet. Spoke to sister and feels unsafe for pt to be d/c.

## 2020-05-07 NOTE — ED Notes (Signed)
Planning to d/c to home per CM. Pt agreeable to plan at this time. Pt unable to ambulate independently when attempting to ambulate to restroom. CM aware.

## 2020-05-07 NOTE — TOC Transition Note (Signed)
Transition of Care [facility]) - CM/SW Discharge Note   Patient Details  Name: Douglas Peterson. MRN: [id number] Date of Birth: [birth year]  Transition of Care [facility]) CM/SW Contact:  Anselm Pancoast, RN Phone Number: 05/07/2020, 2:30 PM   Clinical Narrative:    Spoke to [facility] @ [phone] updated that patient had been denied discharge resources due to history of noncompliance. [name] states she understands and states she will make sure patient has plenty of food in his house although she feels certain patient is eager to get home and start drinking ETOH. Sister states patients brother died the same way from alcoholism. No other needs. TOC signing off.            Patient Goals and CMS Choice        Discharge Placement                       Discharge Plan and Services                                     Social Determinants of Health (SDOH) Interventions     Readmission Risk Interventions Readmission Risk Prevention Plan 12/13/2019 12/12/2019  Transportation Screening Complete Complete  PCP or Specialist Appt within 3-5 Days (No Data) -  HRI or [place] (No Data) -  Palliative Care Screening Complete Complete  Medication Review (RN Care Manager) Complete Complete  Some recent data might be hidden

## 2020-05-07 NOTE — ED Notes (Signed)
Pt gown was soiled so changed into clean gown.

## 2020-05-07 NOTE — ED Notes (Signed)
EMS unable to transport at this time however in line to be transported home.

## 2020-05-07 NOTE — ED Notes (Addendum)
Pt laying in bed. Charge RN aware that pt was waiting for EMS ride home, but after reviewing chart

## 2020-05-07 NOTE — ED Notes (Signed)
As per previous RN, pt is to be discharged home via ambulance and that case management and social work has cleared him to go home.

## 2020-05-08 NOTE — Progress Notes (Signed)
Patient ID: Douglas Peterson., male   DOB: 1951-10-10, 69 y.o.   MRN: [id number]   Psych Brief entry  Patient was seen by NP last pm --still not clear on disposition.  Does not seem to have withdrawal, but need more time to assess his capacity issues.   PT to visit with him   Will be clearer in am on disposition matters.

## 2020-05-08 NOTE — ED Notes (Signed)
Pt calmed down and talked with RN in a respectful manner
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
## 2020-05-08 NOTE — ED Notes (Signed)
Assessment completed  He denies pain  Assisted him to the BR   He then ambulated to a recliner and I placed it where he can see outside this am

## 2020-05-08 NOTE — ED Notes (Signed)
Pt is upset that he is not being discharged now. Pt states he can walk with shoes and a cane. Pt was unable to walk with staff and a walker.  Pt removed BP and pulse ox monitor.  Agricultural consultant notified.

## 2020-05-08 NOTE — ED Notes (Signed)
Nurse tech provided pt with snacks

## 2020-05-08 NOTE — ED Notes (Signed)
Pt given breakfast tray and a cup of coffee.
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
## 2020-05-08 NOTE — ED Notes (Signed)
Pt reports mild nausea

## 2020-05-08 NOTE — ED Provider Notes (Signed)
-----------------------------------------   5:57 AM on 05/08/2020 -----------------------------------------  Patient was up for discharge but when EMS came to pick him up, patient could not get out of bed much less ambulate with walker.  This is a patient with multiple ED visits for same with longstanding history with social work department refuses SNF or hospice placement.  Reportedly there is 1 sister that lives close by and another one who does not live in town who is very concerned about patient living by himself.  Patient appears to be lucid and able to make his own medical decisions, however, he does exhibit poor judgment.  For his safety, he was not discharged home as he lives by himself and cannot ambulate.  Will consult psychiatry to help elucidate patient's capacity for making medical decisions.   Paulette Blanch, MD 05/08/20 [phone]

## 2020-05-08 NOTE — ED Notes (Signed)
Pt is yelling at nurse and cursing at nurse. Pt is not following instructions and RN is unable to get CIWA on pt.

## 2020-05-08 NOTE — ED Notes (Signed)
Pt yelling that he wants to go home. Agricultural consultant notified.
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
## 2020-05-09 MED ORDER — ACETAMINOPHEN 325 MG PO TABS: 650.0000 mg | ORAL_TABLET | Freq: Four times a day (QID) | ORAL | Status: DC | PRN

## 2020-05-09 NOTE — ED Provider Notes (Signed)
7:28 AM   Blood pressure (!) 151/110, pulse 76, temperature 97.9 F (36.6 C), temperature source Oral, resp. rate 18, height 5' 3"  (1.6 m), weight 54 kg, SpO2 97 %.  The patient is calm and cooperative at this time.  There have been no acute events since the last update.  Awaiting disposition plan from SW. Psych was consulted yesterday to determine capacity.     Vanessa Trotwood, MD 05/09/20 [phone]

## 2020-05-09 NOTE — ED Notes (Signed)
Pt attempted to use urinal, unsuccessful at keeping all urine in urinal. Area cleaned, brief changed

## 2020-05-09 NOTE — ED Notes (Addendum)
Patient calling out for help. Upon going to assist patient, he is stating that he wants to go home. Patient reminded that he was waiting for possible placement for rehab. States he still wants to go home, but remains calm and cooperative at this time.

## 2020-05-09 NOTE — ED Notes (Signed)
Patient requesting baclofen. States he takes it at home BID. Medication not seen in record. Per MD, patient has been getting ativan and doesn't need baclofen as well.

## 2020-05-10 NOTE — TOC Progression Note (Addendum)
Transition of Care (TOC) - Progression Note    Patient Details  Name: Douglas Peterson. MRN: [id number] Date of Birth: November 19, 1950  [facility] CM/SW Contact  Anselm Pancoast, RN Phone Number: 05/10/2020, 8:16 AM  Clinical Narrative:    Appears patient is pending psych clearance to return home as patient has no alternate discharge options as previously noted. Will wait for psych clearance confirming patient has mental capacity to make decisions for himself.   RN CM sent additional bed [place] outside of the county despite patient adamant he is only willing to go to [facility]. [facility] has been clear they will not be taking patient back.         Expected Discharge Plan and Services                                                 Social Determinants of Health (SDOH) Interventions    Readmission Risk Interventions Readmission Risk Prevention Plan 12/13/2019 12/12/2019  Transportation Screening Complete Complete  PCP or Specialist Appt within 3-5 Days (No Data) -  HRI or [place] (No Data) -  Palliative Care Screening Complete Complete  Medication Review (RN Care Manager) Complete Complete  Some recent data might be hidden

## 2020-05-10 NOTE — TOC Transition Note (Addendum)
Transition of Care [facility]) - CM/SW Discharge Note   Patient Details  Name: Remberto Lienhard. MRN: [id number] Date of Birth: 09-22-1951  Transition of Care [facility]) CM/SW Contact:  Anselm Pancoast, RN Phone Number: 05/10/2020, 9:37 AM   Clinical Narrative:    Spoke with patient at bedside and patient agrees to home with home health. Discussed history of noncompliance and difficulty finding agency willing to accept patient. Outreach to [place] @ [employer] for potential home health, [place] @ [place] @ [facility] and [place] @ [facility].   Only HHC offer was [place] @ [facility] however patient will have delayed admission due to staffing. Will be seen in 1 week.   [place] with [facility] rescinded offer. No available options at this time. Georgina Snell with [facility] has declined patient.   LVMM for Clarise Cruz @ [facility] requesting services. Return call from Clarise Cruz confirmed no availability.   No availability with [facility].   No availability with [facility].         Patient Goals and CMS Choice        Discharge Placement                       Discharge Plan and Services                                     Social Determinants of Health (SDOH) Interventions     Readmission Risk Interventions Readmission Risk Prevention Plan 12/13/2019 12/12/2019  Transportation Screening Complete Complete  PCP or Specialist Appt within 3-5 Days (No Data) -  HRI or [place] (No Data) -  Palliative Care Screening Complete Complete  Medication Review (RN Care Manager) Complete Complete  Some recent data might be hidden

## 2020-05-10 NOTE — TOC Progression Note (Signed)
Transition of Care (TOC) - Progression Note    Patient Details  Name: Douglas Peterson. MRN: [id number] Date of Birth: 31-Jul-1951  Transition of Care [facility]) CM/SW Contact  Anselm Pancoast, RN Phone Number: 05/10/2020, 4:05 PM  Clinical Narrative:    No SNF options available, No HHC options available. Will wait for psych clearance and prepare for discharge back to his home. Patient states he wants to go home and no other options available at this time. Once medically and psychiatrically clear will plan for discharge back home via EMS.         Expected Discharge Plan and Services                                                 Social Determinants of Health (SDOH) Interventions    Readmission Risk Interventions Readmission Risk Prevention Plan 12/13/2019 12/12/2019  Transportation Screening Complete Complete  PCP or Specialist Appt within 3-5 Days (No Data) -  HRI or [place] (No Data) -  Palliative Care Screening Complete Complete  Medication Review (RN Care Manager) Complete Complete  Some recent data might be hidden

## 2020-05-11 NOTE — ED Notes (Signed)
Pt resting quietly at this time, no distress noted, cont to monitor

## 2020-05-11 NOTE — ED Notes (Signed)
Pt calling out to this RN, asking if his name is on the list for ems to come get him to take him home, pt informed that I would follow up with the ER dr to find out.

## 2020-05-11 NOTE — ED Notes (Signed)
Pt unable to sign for discharge d/t contracture. Pt discharged with [facility] back home. Pt verbalizes understanding of d/c instructions with no further questions at this time.

## 2020-05-11 NOTE — ED Provider Notes (Signed)
-----------------------------------------   10:22 AM on 05/11/2020 -----------------------------------------  Patient has been seen and evaluated by psychiatry.  They believe the patient has capacity make his own medical decisions.  [name] requesting to be discharged home.  Social work was unable to place the patient, regardless the patient does not want to be placed and wishes to go home.  As he has capacity to make his own decisions we will discharge per his wishes.  He has been provided with resources by psychiatry.   Harvest Dark, MD 05/11/20 1023

## 2020-05-11 NOTE — ED Notes (Signed)
Pt given meal tray and changed brief at this time

## 2020-05-11 NOTE — TOC Transition Note (Signed)
Transition of Care [facility]) - CM/SW Discharge Note   Patient Details  Name: Douglas Peterson. MRN: [id number] Date of Birth: December 22, 1950  Transition of Care [facility]) CM/SW Contact:  Anselm Pancoast, RN Phone Number: 05/11/2020, 12:06 PM   Clinical Narrative:    Patient requiring EMS transport home. RN CM contacted Beverely Low @ [facility] for transport due to extended wait times via [facility]. Transport will be arriving around 1:30pm-2:00pm to transport home. Be[facility]ly Low confirmed [name] is no longer paying for EMS transport to home. RN CM ensured patient was aware and in agreeance with EMS transport. No other needs. TOC signing off.          Patient Goals and CMS Choice        Discharge Placement                       Discharge Plan and Services                                     Social Determinants of Health (SDOH) Interventions     Readmission Risk Interventions Readmission Risk Prevention Plan 12/13/2019 12/12/2019  Transportation Screening Complete Complete  PCP or Specialist Appt within 3-5 Days (No Data) -  HRI or [place] (No Data) -  Palliative Care Screening Complete Complete  Medication Review (RN Care Manager) Complete Complete  Some recent data might be hidden

## 2020-05-11 NOTE — TOC Transition Note (Signed)
Transition of Care [facility]) - CM/SW Discharge Note   Patient Details  Name: Douglas Peterson. MRN: [id number] Date of Birth: February 22, 1951  Transition of Care [facility]) CM/SW Contact:  Anselm Pancoast, RN Phone Number: 05/11/2020, 11:46 AM   Clinical Narrative:    Patient assessed by psych and discussed potential intensive outpatient treatment. Patient provided OP resources for local substance abuse resources and programs as well as transportation assistance. Patient will discharge home via EMS. Dr. Janese Banks spoke with sister, [name].          Patient Goals and CMS Choice        Discharge Placement                       Discharge Plan and Services                                     Social Determinants of Health (SDOH) Interventions     Readmission Risk Interventions Readmission Risk Prevention Plan 12/13/2019 12/12/2019  Transportation Screening Complete Complete  PCP or Specialist Appt within 3-5 Days (No Data) -  HRI or [place] (No Data) -  Palliative Care Screening Complete Complete  Medication Review (RN Care Manager) Complete Complete  Some recent data might be hidden

## 2020-05-11 NOTE — ED Notes (Signed)
Pt assisted to the bathroom, pt one person assist states that he usually uses a cane at home, pt helped to clean up after BM, no wounds noted to his sacral or buttocks area, pt does appear to have some irritation between his legs. New depends applied to pt

## 2020-05-11 NOTE — Final Progress Note (Signed)
Physician Final Progress Note  Patient ID: Douglas Peterson. MRN: [id number] DOB/AGE: 1951/07/27 69 y.o.  Admit date: 05/05/2020 Admitting provider: No admitting provider for patient encounter. Discharge date: 05/11/2020   Admission Diagnoses: ETOH dependence Major Depression Moderate Recurrent  Generalized anxiety  Adjustment Disorder --mixed emotions and conduct    Discharge Diagnoses:  Active Problems:   Alcohol use   Delirium tremens (HCC)   Acute metabolic encephalopathy   Alcohol withdrawal (HCC)   Alcohol withdrawal delirium, acute, hyperactive ([place])   Alcohol intoxication, uncomplicated ([place])   Alcohol abuse   Alcohol withdrawal delirium ([place])   Alcoholic cirrhosis of liver without ascites ([place])   Tremor   Weakness   Depression   Fall at home, initial encounter    Consults:  SW--TTS  ---ER MD -- Psych  Significant Findings/ Diagnostic Studies:   None   Except ---  Procedures:  none  Discharge Condition: --Stable    Disposition:  There are no questions and answers to display.        Diet:  Regular  Discharge Activity:   AA NA/   Day treatment, related programming [name] voluntary is  status He does not meet IVC criteria --- is not danger to Self or others.  Meets Capacity for consent ---he knows the nature of his illness and the results if he does not get treatment   Spoke to sister who lives too far away to explain he at least says he wants to go to day treatment for ETOH ---unclear if he will drink right away but he does have his keys at home and can drive   SW gave referrals  He does not have paid in home services   Alert oriented in general Consciousness not clouded or fluctuant Mood relatively normal not anxious No active Psychosis or mania Contracts for safety  Judgement insight reliability fair,   Poor when intoxicated  No active SI HI or plans  Wants to go home           Follow-up Information    [name], MD In 1  day.   Specialty: Internal Medicine Contact information: [place] [id number] EMERGENCY DEPARTMENT.   Specialty: Emergency Medicine Why: If symptoms worsen Contact information: [place] [phone] [address] [place] [phone]              Total time spent taking care of this patient:  45 min Signed: Eulas Post 05/11/2020, 10:39 AM

## 2020-05-15 ENCOUNTER — Encounter: Payer: Self-pay | Admitting: Emergency Medicine

## 2020-05-15 ENCOUNTER — Emergency Department
Admission: EM | Admit: 2020-05-15 | Discharge: 2020-05-15 | Disposition: A | Discharge status: Home / Self Care | Payer: Medicare HMO | Source: Home / Self Care | Attending: Emergency Medicine | Admitting: Emergency Medicine

## 2020-05-15 ENCOUNTER — Other Ambulatory Visit: Payer: Self-pay

## 2020-05-15 ENCOUNTER — Emergency Department: Payer: Medicare HMO

## 2020-05-15 DIAGNOSIS — F10129 Alcohol abuse with intoxication, unspecified: Secondary | ICD-10-CM | POA: Insufficient documentation

## 2020-05-15 DIAGNOSIS — F10920 Alcohol use, unspecified with intoxication, uncomplicated: Secondary | ICD-10-CM

## 2020-05-15 DIAGNOSIS — F1010 Alcohol abuse, uncomplicated: Secondary | ICD-10-CM | POA: Diagnosis not present

## 2020-05-15 DIAGNOSIS — I10 Essential (primary) hypertension: Secondary | ICD-10-CM | POA: Insufficient documentation

## 2020-05-15 DIAGNOSIS — Z87891 Personal history of nicotine dependence: Secondary | ICD-10-CM | POA: Insufficient documentation

## 2020-05-15 DIAGNOSIS — F10220 Alcohol dependence with intoxication, uncomplicated: Secondary | ICD-10-CM | POA: Diagnosis not present

## 2020-05-15 DIAGNOSIS — Z79899 Other long term (current) drug therapy: Secondary | ICD-10-CM | POA: Insufficient documentation

## 2020-05-15 LAB — CBC WITH DIFFERENTIAL/PLATELET
Abs Immature Granulocytes: 0.04 10*3/uL (ref 0.00–0.07)
Basophils Absolute: 0.1 10*3/uL (ref 0.0–0.1)
Basophils Relative: 1 %
Eosinophils Absolute: 0.1 10*3/uL (ref 0.0–0.5)
Eosinophils Relative: 1 %
HCT: 45 % (ref 39.0–52.0)
Hemoglobin: 15.6 g/dL (ref 13.0–17.0)
Immature Granulocytes: 0 %
Lymphocytes Relative: 63 %
Lymphs Abs: 10.3 10*3/uL — ABNORMAL HIGH (ref 0.7–4.0)
MCH: 30.8 pg (ref 26.0–34.0)
MCHC: 34.7 g/dL (ref 30.0–36.0)
MCV: 88.8 fL (ref 80.0–100.0)
Monocytes Absolute: 0.8 10*3/uL (ref 0.1–1.0)
Monocytes Relative: 5 %
Neutro Abs: 5 10*3/uL (ref 1.7–7.7)
Neutrophils Relative %: 30 %
Platelets: 287 10*3/uL (ref 150–400)
RBC: 5.07 MIL/uL (ref 4.22–5.81)
RDW: 13.5 % (ref 11.5–15.5)
WBC Morphology: ABNORMAL
WBC: 16.2 10*3/uL — ABNORMAL HIGH (ref 4.0–10.5)
nRBC: 0.2 % (ref 0.0–0.2)

## 2020-05-15 LAB — URINALYSIS, ROUTINE W REFLEX MICROSCOPIC
Bacteria, UA: NONE SEEN
Bilirubin Urine: NEGATIVE
Glucose, UA: NEGATIVE mg/dL
Hgb urine dipstick: NEGATIVE
Ketones, ur: NEGATIVE mg/dL
Leukocytes,Ua: NEGATIVE
Nitrite: NEGATIVE
Protein, ur: NEGATIVE mg/dL
Specific Gravity, Urine: 1.006 (ref 1.005–1.030)
pH: 5 (ref 5.0–8.0)

## 2020-05-15 LAB — COMPREHENSIVE METABOLIC PANEL
ALT: 29 U/L (ref 0–44)
AST: 53 U/L — ABNORMAL HIGH (ref 15–41)
Albumin: 4.7 g/dL (ref 3.5–5.0)
Alkaline Phosphatase: 84 U/L (ref 38–126)
Anion gap: 12 (ref 5–15)
BUN: <5 mg/dL — ABNORMAL LOW (ref 8–23)
CO2: 29 mmol/L (ref 22–32)
Calcium: 8.8 mg/dL — ABNORMAL LOW (ref 8.9–10.3)
Chloride: 95 mmol/L — ABNORMAL LOW (ref 98–111)
Creatinine, Ser: 0.62 mg/dL (ref 0.61–1.24)
GFR calc Af Amer: >60 mL/min (ref >60)
GFR calc non Af Amer: >60 mL/min (ref >60)
Glucose, Bld: 84 mg/dL (ref 70–99)
Potassium: 4.4 mmol/L (ref 3.5–5.1)
Sodium: 136 mmol/L (ref 135–145)
Total Bilirubin: 0.9 mg/dL (ref 0.3–1.2)
Total Protein: 8.6 g/dL — ABNORMAL HIGH (ref 6.5–8.1)

## 2020-05-15 LAB — URINE DRUG SCREEN, QUALITATIVE (ARMC ONLY)
Amphetamines, Ur Screen: NOT DETECTED
Barbiturates, Ur Screen: NOT DETECTED
Benzodiazepine, Ur Scrn: NOT DETECTED
Cannabinoid 50 Ng, Ur ~~LOC~~: NOT DETECTED
Cocaine Metabolite,Ur ~~LOC~~: NOT DETECTED
MDMA (Ecstasy)Ur Screen: NOT DETECTED
Methadone Scn, Ur: NOT DETECTED
Opiate, Ur Screen: NOT DETECTED
Phencyclidine (PCP) Ur S: NOT DETECTED
Tricyclic, Ur Screen: NOT DETECTED

## 2020-05-15 LAB — ETHANOL: Alcohol, Ethyl (B): 285 mg/dL — ABNORMAL HIGH (ref <10)

## 2020-05-15 LAB — MAGNESIUM: Magnesium: 2.2 mg/dL (ref 1.7–2.4)

## 2020-05-15 MED ORDER — CHLORDIAZEPOXIDE HCL 25 MG PO CAPS
25.0000 mg | ORAL_CAPSULE | Freq: Once | ORAL | Status: AC
  Administered 2020-05-15: 25 mg via ORAL
  Filled 2020-05-15: qty 1

## 2020-05-15 MED ORDER — LACTATED RINGERS IV BOLUS
1000.0000 mL | Freq: Once | INTRAVENOUS | Status: AC
  Administered 2020-05-15: 1000 mL via INTRAVENOUS

## 2020-05-15 NOTE — ED Notes (Signed)
Pt provided dinner meal.
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
## 2020-05-15 NOTE — ED Notes (Signed)
Explained to patient that we were waiting for transport service to DC patient. Pt stated "call me a taxi, it doesn't matter to me".

## 2020-05-15 NOTE — ED Notes (Signed)
Pt resting in bed at this time. Chest rise/fall observed. Respirations regular and unlabored.

## 2020-05-15 NOTE — ED Notes (Signed)
Patient transported to X-ray 

## 2020-05-15 NOTE — ED Notes (Signed)
Pt states he is at ED because he is "sick" from drinking alcohol. Pt stated he drinks about 24 beers a day and has been doing so for 6 months. Alcohol can be smelled while talking to pt.

## 2020-05-15 NOTE — ED Notes (Signed)
[name] bedding changed while trx to x-ray.
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
## 2020-05-15 NOTE — ED Notes (Signed)
This RN asked patient if there was anyone who could help him get inside when he gets home. This patient stated there wasn't anyone.

## 2020-05-15 NOTE — ED Notes (Signed)
Pt came in saturated in stool, urine and vomit. Pt taken to decon shower and cleaned by this tech and Gabby. EDT. Pt placed in blue scrubs and a brief. Pt wheeled to 19H and given a warm blanket. Pt has no further needs at this time.

## 2020-05-15 NOTE — ED Triage Notes (Signed)
See first nurse note.  Pt drowsy.

## 2020-05-16 LAB — PATHOLOGIST SMEAR REVIEW

## 2020-05-17 ENCOUNTER — Encounter: Payer: Self-pay | Admitting: Emergency Medicine

## 2020-05-17 ENCOUNTER — Inpatient Hospital Stay
Admission: EM | Admit: 2020-05-17 | Discharge: 2020-05-28 | DRG: 896 | Disposition: A | Discharge status: Home / Self Care | Payer: Medicare HMO | Attending: Internal Medicine | Admitting: Internal Medicine

## 2020-05-17 ENCOUNTER — Other Ambulatory Visit: Payer: Self-pay

## 2020-05-17 DIAGNOSIS — Z87820 Personal history of traumatic brain injury: Secondary | ICD-10-CM

## 2020-05-17 DIAGNOSIS — I10 Essential (primary) hypertension: Secondary | ICD-10-CM | POA: Diagnosis present

## 2020-05-17 DIAGNOSIS — Y92009 Unspecified place in unspecified non-institutional (private) residence as the place of occurrence of the external cause: Secondary | ICD-10-CM

## 2020-05-17 DIAGNOSIS — F329 Major depressive disorder, single episode, unspecified: Secondary | ICD-10-CM | POA: Diagnosis present

## 2020-05-17 DIAGNOSIS — D6869 Other thrombophilia: Secondary | ICD-10-CM | POA: Diagnosis not present

## 2020-05-17 DIAGNOSIS — R000 Tachycardia, unspecified: Secondary | ICD-10-CM | POA: Diagnosis not present

## 2020-05-17 DIAGNOSIS — R627 Adult failure to thrive: Secondary | ICD-10-CM | POA: Diagnosis present

## 2020-05-17 DIAGNOSIS — D6859 Other primary thrombophilia: Secondary | ICD-10-CM | POA: Diagnosis present

## 2020-05-17 DIAGNOSIS — F411 Generalized anxiety disorder: Secondary | ICD-10-CM | POA: Diagnosis present

## 2020-05-17 DIAGNOSIS — G629 Polyneuropathy, unspecified: Secondary | ICD-10-CM | POA: Diagnosis present

## 2020-05-17 DIAGNOSIS — Z20822 Contact with and (suspected) exposure to covid-19: Secondary | ICD-10-CM | POA: Diagnosis present

## 2020-05-17 DIAGNOSIS — W19XXXA Unspecified fall, initial encounter: Secondary | ICD-10-CM

## 2020-05-17 DIAGNOSIS — K219 Gastro-esophageal reflux disease without esophagitis: Secondary | ICD-10-CM

## 2020-05-17 DIAGNOSIS — F1010 Alcohol abuse, uncomplicated: Secondary | ICD-10-CM

## 2020-05-17 DIAGNOSIS — M6282 Rhabdomyolysis: Secondary | ICD-10-CM | POA: Diagnosis not present

## 2020-05-17 DIAGNOSIS — I739 Peripheral vascular disease, unspecified: Secondary | ICD-10-CM | POA: Diagnosis present

## 2020-05-17 DIAGNOSIS — F1020 Alcohol dependence, uncomplicated: Secondary | ICD-10-CM

## 2020-05-17 DIAGNOSIS — R7989 Other specified abnormal findings of blood chemistry: Secondary | ICD-10-CM

## 2020-05-17 DIAGNOSIS — F10230 Alcohol dependence with withdrawal, uncomplicated: Secondary | ICD-10-CM | POA: Diagnosis not present

## 2020-05-17 DIAGNOSIS — E43 Unspecified severe protein-calorie malnutrition: Secondary | ICD-10-CM | POA: Diagnosis present

## 2020-05-17 DIAGNOSIS — M24521 Contracture, right elbow: Secondary | ICD-10-CM | POA: Diagnosis present

## 2020-05-17 DIAGNOSIS — F10220 Alcohol dependence with intoxication, uncomplicated: Principal | ICD-10-CM | POA: Diagnosis present

## 2020-05-17 DIAGNOSIS — Z9181 History of falling: Secondary | ICD-10-CM | POA: Diagnosis not present

## 2020-05-17 DIAGNOSIS — R531 Weakness: Secondary | ICD-10-CM | POA: Diagnosis not present

## 2020-05-17 DIAGNOSIS — K7030 Alcoholic cirrhosis of liver without ascites: Secondary | ICD-10-CM | POA: Diagnosis present

## 2020-05-17 DIAGNOSIS — G9341 Metabolic encephalopathy: Secondary | ICD-10-CM | POA: Diagnosis present

## 2020-05-17 DIAGNOSIS — F1021 Alcohol dependence, in remission: Secondary | ICD-10-CM | POA: Diagnosis not present

## 2020-05-17 DIAGNOSIS — Z6820 Body mass index (BMI) 20.0-20.9, adult: Secondary | ICD-10-CM | POA: Diagnosis not present

## 2020-05-17 DIAGNOSIS — Y908 Blood alcohol level of 240 mg/100 ml or more: Secondary | ICD-10-CM | POA: Diagnosis present

## 2020-05-17 DIAGNOSIS — Z79899 Other long term (current) drug therapy: Secondary | ICD-10-CM | POA: Diagnosis not present

## 2020-05-17 DIAGNOSIS — Z7982 Long term (current) use of aspirin: Secondary | ICD-10-CM

## 2020-05-17 DIAGNOSIS — T796XXA Traumatic ischemia of muscle, initial encounter: Secondary | ICD-10-CM | POA: Diagnosis present

## 2020-05-17 DIAGNOSIS — F09 Unspecified mental disorder due to known physiological condition: Secondary | ICD-10-CM | POA: Diagnosis not present

## 2020-05-17 DIAGNOSIS — W19XXXD Unspecified fall, subsequent encounter: Secondary | ICD-10-CM | POA: Diagnosis not present

## 2020-05-17 DIAGNOSIS — I340 Nonrheumatic mitral (valve) insufficiency: Secondary | ICD-10-CM | POA: Diagnosis not present

## 2020-05-17 DIAGNOSIS — R4182 Altered mental status, unspecified: Secondary | ICD-10-CM | POA: Diagnosis not present

## 2020-05-17 DIAGNOSIS — W19XXXS Unspecified fall, sequela: Secondary | ICD-10-CM | POA: Diagnosis not present

## 2020-05-17 DIAGNOSIS — Z87891 Personal history of nicotine dependence: Secondary | ICD-10-CM

## 2020-05-17 DIAGNOSIS — Z7141 Alcohol abuse counseling and surveillance of alcoholic: Secondary | ICD-10-CM

## 2020-05-17 DIAGNOSIS — I480 Paroxysmal atrial fibrillation: Secondary | ICD-10-CM | POA: Diagnosis present

## 2020-05-17 DIAGNOSIS — L89312 Pressure ulcer of right buttock, stage 2: Secondary | ICD-10-CM | POA: Diagnosis present

## 2020-05-17 DIAGNOSIS — F10930 Alcohol use, unspecified with withdrawal, uncomplicated: Secondary | ICD-10-CM

## 2020-05-17 LAB — CBC WITH DIFFERENTIAL/PLATELET
Abs Immature Granulocytes: 0.02 10*3/uL (ref 0.00–0.07)
Basophils Absolute: 0.1 10*3/uL (ref 0.0–0.1)
Basophils Relative: 1 %
Eosinophils Absolute: 0.2 10*3/uL (ref 0.0–0.5)
Eosinophils Relative: 2 %
HCT: 46.7 % (ref 39.0–52.0)
Hemoglobin: 16 g/dL (ref 13.0–17.0)
Immature Granulocytes: 0 %
Lymphocytes Relative: 73 %
Lymphs Abs: 7.9 10*3/uL — ABNORMAL HIGH (ref 0.7–4.0)
MCH: 31.3 pg (ref 26.0–34.0)
MCHC: 34.3 g/dL (ref 30.0–36.0)
MCV: 91.2 fL (ref 80.0–100.0)
Monocytes Absolute: 0.5 10*3/uL (ref 0.1–1.0)
Monocytes Relative: 4 %
Neutro Abs: 2.2 10*3/uL (ref 1.7–7.7)
Neutrophils Relative %: 20 %
Platelets: 275 10*3/uL (ref 150–400)
RBC: 5.12 MIL/uL (ref 4.22–5.81)
RDW: 15.1 % (ref 11.5–15.5)
WBC Morphology: ABNORMAL
WBC: 10.8 10*3/uL — ABNORMAL HIGH (ref 4.0–10.5)
nRBC: 0.3 % — ABNORMAL HIGH (ref 0.0–0.2)

## 2020-05-17 LAB — COMPREHENSIVE METABOLIC PANEL
ALT: 39 U/L (ref 0–44)
AST: 57 U/L — ABNORMAL HIGH (ref 15–41)
Albumin: 4.2 g/dL (ref 3.5–5.0)
Alkaline Phosphatase: 81 U/L (ref 38–126)
Anion gap: 13 (ref 5–15)
BUN: <5 mg/dL — ABNORMAL LOW (ref 8–23)
CO2: 28 mmol/L (ref 22–32)
Calcium: 8.3 mg/dL — ABNORMAL LOW (ref 8.9–10.3)
Chloride: 98 mmol/L (ref 98–111)
Creatinine, Ser: 0.54 mg/dL — ABNORMAL LOW (ref 0.61–1.24)
GFR calc Af Amer: >60 mL/min (ref >60)
GFR calc non Af Amer: >60 mL/min (ref >60)
Glucose, Bld: 88 mg/dL (ref 70–99)
Potassium: 3.9 mmol/L (ref 3.5–5.1)
Sodium: 139 mmol/L (ref 135–145)
Total Bilirubin: 0.7 mg/dL (ref 0.3–1.2)
Total Protein: 7.6 g/dL (ref 6.5–8.1)

## 2020-05-17 LAB — ETHANOL: Alcohol, Ethyl (B): 332 mg/dL (ref <10)

## 2020-05-17 LAB — GLUCOSE, CAPILLARY: Glucose-Capillary: 87 mg/dL (ref 70–99)

## 2020-05-17 MED ORDER — SODIUM CHLORIDE 0.9 % IV SOLN: 250.0000 mL | INTRAVENOUS | Status: DC | PRN

## 2020-05-17 MED ORDER — THIAMINE HCL 100 MG/ML IJ SOLN: 100.0000 mg | Freq: Every day | INTRAMUSCULAR | Status: DC

## 2020-05-17 MED ORDER — FOLIC ACID 1 MG PO TABS
1.0000 mg | ORAL_TABLET | Freq: Every day | ORAL | Status: DC
  Administered 2020-05-18 – 2020-05-28 (×11): 1 mg via ORAL
  Filled 2020-05-17 (×11): qty 1

## 2020-05-17 MED ORDER — HEPARIN SODIUM (PORCINE) 5000 UNIT/ML IJ SOLN
5000.0000 [IU] | Freq: Two times a day (BID) | INTRAMUSCULAR | Status: DC
  Administered 2020-05-18 – 2020-05-28 (×21): 5000 [IU] via SUBCUTANEOUS
  Filled 2020-05-17 (×21): qty 1

## 2020-05-17 MED ORDER — SODIUM CHLORIDE 0.9 % IV BOLUS
1000.0000 mL | Freq: Once | INTRAVENOUS | Status: AC
  Administered 2020-05-17: 1000 mL via INTRAVENOUS

## 2020-05-17 MED ORDER — SODIUM CHLORIDE 0.9% FLUSH
3.0000 mL | Freq: Two times a day (BID) | INTRAVENOUS | Status: DC
  Administered 2020-05-18 – 2020-05-26 (×16): 3 mL via INTRAVENOUS

## 2020-05-17 MED ORDER — ADULT MULTIVITAMIN W/MINERALS CH
1.0000 | ORAL_TABLET | Freq: Every day | ORAL | Status: DC
  Administered 2020-05-18 – 2020-05-28 (×11): 1 via ORAL
  Filled 2020-05-17 (×11): qty 1

## 2020-05-17 MED ORDER — CHLORDIAZEPOXIDE HCL 5 MG PO CAPS
5.0000 mg | ORAL_CAPSULE | Freq: Four times a day (QID) | ORAL | Status: AC
  Administered 2020-05-18 – 2020-05-19 (×8): 5 mg via ORAL
  Filled 2020-05-17 (×8): qty 1

## 2020-05-17 MED ORDER — ONDANSETRON HCL 4 MG/2ML IJ SOLN
4.0000 mg | Freq: Once | INTRAMUSCULAR | Status: AC
  Administered 2020-05-17: 4 mg via INTRAVENOUS
  Filled 2020-05-17: qty 2

## 2020-05-17 MED ORDER — SODIUM CHLORIDE 0.9% FLUSH
3.0000 mL | INTRAVENOUS | Status: DC | PRN
  Administered 2020-05-20: 3 mL via INTRAVENOUS

## 2020-05-17 MED ORDER — THIAMINE HCL 100 MG/ML IJ SOLN
100.0000 mg | Freq: Every day | INTRAMUSCULAR | Status: DC
  Administered 2020-05-18 – 2020-05-21 (×4): 100 mg via INTRAVENOUS
  Filled 2020-05-17 (×4): qty 2

## 2020-05-17 MED ORDER — LORAZEPAM 2 MG/ML IJ SOLN
2.0000 mg | Freq: Once | INTRAMUSCULAR | Status: AC
  Administered 2020-05-17: 2 mg via INTRAVENOUS
  Filled 2020-05-17: qty 1

## 2020-05-17 NOTE — ED Notes (Signed)
Iv fluids infusing.  Pt telling a joke to iv team nurse.  nsr on monitor.

## 2020-05-17 NOTE — ED Notes (Signed)
Resumed care from collyn rn.  Pt with urinary incontinence.  Pt changed.  Pt waiitng on iv team for iv.

## 2020-05-17 NOTE — ED Notes (Signed)
Pt provided w/ TV remote.
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
## 2020-05-17 NOTE — ED Notes (Signed)
Iv team nurse in with pt now.

## 2020-05-17 NOTE — ED Notes (Signed)
Pt requested and provided with warm blanket, pt verbalizes no other needs at this time

## 2020-05-17 NOTE — ED Notes (Signed)
report off to [employer]

## 2020-05-17 NOTE — ED Notes (Signed)
Lab in with pt now

## 2020-05-17 NOTE — ED Provider Notes (Signed)
-----------------------------------------   10:56 PM on 05/17/2020 -----------------------------------------  I took over care of this patient with Dr. Quentin Cornwall this afternoon.  The patient presented with alcohol intoxication after being found in his car.  Plan was to observe for sobriety and possibly discharge, however the patient appears to have gradually got into alcohol withdrawal before he was fully sober and able to be discharged.  Currently his heart rate is around 753 and the systolic blood pressure is in the 130s.  His CIWA score was 12 so I gave IV Ativan.  He will require admission for further monitoring and treatment of alcohol withdrawal.  I discussed his case with Dr. Posey Pronto from the hospitalist service.   Arta Silence, MD 05/17/20 2258

## 2020-05-17 NOTE — ED Notes (Signed)
Pt eating dinner tray. Iv fluids infusing.  Pt alert calm and cooperative.

## 2020-05-17 NOTE — ED Notes (Signed)
Pt requested and provided with warm blanket
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
## 2020-05-17 NOTE — ED Triage Notes (Signed)
Pt here via EMS from home, ems states pt was found by [employer] in his car and had been there for about 3 or so days, pt smells heavily of alcohol and urine, states "I was just trying to numb everything." Denies si/hi. EMS states pt had 37 empty beer cans around him upon arrival, pt placed in recliner chair in hall at this time. Oriented to name, DOB and place.

## 2020-05-18 ENCOUNTER — Inpatient Hospital Stay (HOSPITAL_COMMUNITY)
Admit: 2020-05-18 | Discharge: 2020-05-18 | Disposition: A | Discharge status: Home / Self Care | Payer: Medicare HMO | Attending: Internal Medicine | Admitting: Internal Medicine

## 2020-05-18 DIAGNOSIS — W19XXXD Unspecified fall, subsequent encounter: Secondary | ICD-10-CM

## 2020-05-18 DIAGNOSIS — K219 Gastro-esophageal reflux disease without esophagitis: Secondary | ICD-10-CM | POA: Diagnosis not present

## 2020-05-18 DIAGNOSIS — R531 Weakness: Secondary | ICD-10-CM | POA: Diagnosis not present

## 2020-05-18 DIAGNOSIS — E43 Unspecified severe protein-calorie malnutrition: Secondary | ICD-10-CM | POA: Diagnosis not present

## 2020-05-18 DIAGNOSIS — I340 Nonrheumatic mitral (valve) insufficiency: Secondary | ICD-10-CM

## 2020-05-18 LAB — CK: Total CK: 731 U/L — ABNORMAL HIGH (ref 49–397)

## 2020-05-18 LAB — COMPREHENSIVE METABOLIC PANEL
ALT: 32 U/L (ref 0–44)
AST: 50 U/L — ABNORMAL HIGH (ref 15–41)
Albumin: 3.8 g/dL (ref 3.5–5.0)
Alkaline Phosphatase: 83 U/L (ref 38–126)
Anion gap: 10 (ref 5–15)
BUN: 5 mg/dL — ABNORMAL LOW (ref 8–23)
CO2: 27 mmol/L (ref 22–32)
Calcium: 8.3 mg/dL — ABNORMAL LOW (ref 8.9–10.3)
Chloride: 103 mmol/L (ref 98–111)
Creatinine, Ser: 0.6 mg/dL — ABNORMAL LOW (ref 0.61–1.24)
GFR calc Af Amer: >60 mL/min (ref >60)
GFR calc non Af Amer: >60 mL/min (ref >60)
Glucose, Bld: 98 mg/dL (ref 70–99)
Potassium: 3.6 mmol/L (ref 3.5–5.1)
Sodium: 140 mmol/L (ref 135–145)
Total Bilirubin: 0.5 mg/dL (ref 0.3–1.2)
Total Protein: 7 g/dL (ref 6.5–8.1)

## 2020-05-18 LAB — TROPONIN I (HIGH SENSITIVITY)
Troponin I (High Sensitivity): 10 ng/L (ref <18)
Troponin I (High Sensitivity): 11 ng/L (ref <18)
Troponin I (High Sensitivity): 9 ng/L (ref <18)

## 2020-05-18 LAB — CBC
HCT: 39.6 % (ref 39.0–52.0)
Hemoglobin: 14 g/dL (ref 13.0–17.0)
MCH: 31.4 pg (ref 26.0–34.0)
MCHC: 35.4 g/dL (ref 30.0–36.0)
MCV: 88.8 fL (ref 80.0–100.0)
Platelets: 218 10*3/uL (ref 150–400)
RBC: 4.46 MIL/uL (ref 4.22–5.81)
RDW: 13.9 % (ref 11.5–15.5)
WBC: 9.9 10*3/uL (ref 4.0–10.5)
nRBC: 0 % (ref 0.0–0.2)

## 2020-05-18 LAB — GLUCOSE, CAPILLARY: Glucose-Capillary: 113 mg/dL — ABNORMAL HIGH (ref 70–99)

## 2020-05-18 LAB — PHOSPHORUS: Phosphorus: 2.6 mg/dL (ref 2.5–4.6)

## 2020-05-18 LAB — ECHOCARDIOGRAM COMPLETE
AR max vel: 2.55 cm2
AV Area VTI: 2.92 cm2
AV Area mean vel: 2.27 cm2
AV Mean grad: 3 mmHg
AV Peak grad: 5.3 mmHg
Ao pk vel: 1.16 m/s
Area-P 1/2: 5.02 cm2
Height: 64 in
S' Lateral: 2.45 cm
Weight: 1873.03 oz

## 2020-05-18 LAB — MAGNESIUM: Magnesium: 1.7 mg/dL (ref 1.7–2.4)

## 2020-05-18 LAB — SARS CORONAVIRUS 2 BY RT PCR (HOSPITAL ORDER, PERFORMED IN ~~LOC~~ HOSPITAL LAB): SARS Coronavirus 2: NEGATIVE

## 2020-05-18 LAB — MRSA PCR SCREENING: MRSA by PCR: NEGATIVE

## 2020-05-18 MED ORDER — DOXEPIN HCL 10 MG PO CAPS
10.0000 mg | ORAL_CAPSULE | Freq: Every evening | ORAL | Status: DC | PRN
  Administered 2020-05-19 – 2020-05-27 (×8): 10 mg via ORAL
  Filled 2020-05-18 (×9): qty 1

## 2020-05-18 MED ORDER — SODIUM CHLORIDE 0.9 % IV SOLN: INTRAVENOUS | Status: DC

## 2020-05-18 MED ORDER — PANTOPRAZOLE SODIUM 40 MG PO TBEC
40.0000 mg | DELAYED_RELEASE_TABLET | Freq: Every day | ORAL | Status: DC
  Administered 2020-05-18 – 2020-05-28 (×11): 40 mg via ORAL
  Filled 2020-05-18 (×11): qty 1

## 2020-05-18 MED ORDER — ADULT MULTIVITAMIN W/MINERALS CH: 1.0000 | ORAL_TABLET | Freq: Every day | ORAL | Status: DC

## 2020-05-18 MED ORDER — MAGNESIUM SULFATE 2 GM/50ML IV SOLN
2.0000 g | Freq: Once | INTRAVENOUS | Status: AC
  Administered 2020-05-18: 2 g via INTRAVENOUS
  Filled 2020-05-18: qty 50

## 2020-05-18 MED ORDER — FOLIC ACID 1 MG PO TABS: 1.0000 mg | ORAL_TABLET | Freq: Every day | ORAL | Status: DC

## 2020-05-18 MED ORDER — CHLORHEXIDINE GLUCONATE CLOTH 2 % EX PADS
6.0000 | MEDICATED_PAD | Freq: Every day | CUTANEOUS | Status: DC
  Administered 2020-05-18 – 2020-05-21 (×4): 6 via TOPICAL

## 2020-05-18 MED ORDER — OMEPRAZOLE MAGNESIUM 20 MG PO TBEC: 20.0000 mg | DELAYED_RELEASE_TABLET | Freq: Every day | ORAL | Status: DC

## 2020-05-18 MED ORDER — VITAMIN D3 25 MCG (1000 UNIT) PO TABS
5000.0000 [IU] | ORAL_TABLET | Freq: Every day | ORAL | Status: DC
  Administered 2020-05-18 – 2020-05-28 (×11): 5000 [IU] via ORAL
  Filled 2020-05-18 (×20): qty 5

## 2020-05-18 MED ORDER — POLYETHYLENE GLYCOL 3350 17 G PO PACK
17.0000 g | PACK | Freq: Every day | ORAL | Status: DC
  Administered 2020-05-19 – 2020-05-23 (×5): 17 g via ORAL
  Filled 2020-05-18 (×5): qty 1

## 2020-05-18 MED ORDER — ENSURE ENLIVE PO LIQD
237.0000 mL | Freq: Three times a day (TID) | ORAL | Status: DC
  Administered 2020-05-18 – 2020-05-28 (×30): 237 mL via ORAL

## 2020-05-18 MED ORDER — ATENOLOL 25 MG PO TABS
25.0000 mg | ORAL_TABLET | Freq: Two times a day (BID) | ORAL | Status: DC
  Administered 2020-05-18 – 2020-05-28 (×19): 25 mg via ORAL
  Filled 2020-05-18 (×22): qty 1

## 2020-05-18 MED ORDER — ASPIRIN EC 81 MG PO TBEC
81.0000 mg | DELAYED_RELEASE_TABLET | Freq: Every day | ORAL | Status: DC
  Administered 2020-05-18 – 2020-05-28 (×11): 81 mg via ORAL
  Filled 2020-05-18 (×11): qty 1

## 2020-05-18 MED ORDER — LORAZEPAM 2 MG/ML IJ SOLN
1.0000 mg | INTRAMUSCULAR | Status: AC | PRN
  Administered 2020-05-18: 2 mg via INTRAVENOUS
  Filled 2020-05-18: qty 1

## 2020-05-18 MED ORDER — LORAZEPAM 1 MG PO TABS
1.0000 mg | ORAL_TABLET | ORAL | Status: AC | PRN
  Administered 2020-05-18 – 2020-05-19 (×5): 1 mg via ORAL
  Administered 2020-05-20 (×2): 2 mg via ORAL
  Filled 2020-05-18 (×4): qty 1
  Filled 2020-05-18 (×3): qty 2

## 2020-05-18 MED ORDER — PAROXETINE HCL 30 MG PO TABS
30.0000 mg | ORAL_TABLET | Freq: Every day | ORAL | Status: DC
  Administered 2020-05-19 – 2020-05-28 (×10): 30 mg via ORAL
  Filled 2020-05-18 (×11): qty 1

## 2020-05-18 MED ORDER — CALCIUM CARBONATE-VITAMIN D 500-200 MG-UNIT PO TABS
1.0000 | ORAL_TABLET | Freq: Every day | ORAL | Status: DC
  Administered 2020-05-19 – 2020-05-28 (×10): 1 via ORAL
  Filled 2020-05-18 (×10): qty 1

## 2020-05-18 NOTE — ED Notes (Signed)
Pt admitted to ICU 3.

## 2020-05-18 NOTE — ED Notes (Signed)
Pt eating crackers and drinking without difficulty.

## 2020-05-18 NOTE — Progress Notes (Signed)
Pt transferred to RM #[id number] at this time. VSS prior to tansfer. Report given to receiving RN.

## 2020-05-18 NOTE — ED Notes (Signed)
Report called to RN pt admitted to stepdown.
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
## 2020-05-18 NOTE — Progress Notes (Signed)
Initial Nutrition Assessment  DOCUMENTATION CODES:   Severe malnutrition in context of social or environmental circumstances  INTERVENTION:   Ensure Enlive po TID, each supplement provides 350 kcal and 20 grams of protein  Magic cup TID with meals, each supplement provides 290 kcal and 9 grams of protein  MVI, thiamine and folic acid daily in setting of etoh abuse  Dysphagia 3 diet   Pt at high refeed risk; recommend monitor K, Mg and P labs daily as oral intake improves  NUTRITION DIAGNOSIS:   Severe Malnutrition related to social / environmental circumstances (etoh abuse) as evidenced by moderate to severe fat depletions, moderate to severe muscle depletions.  GOAL:   Patient will meet greater than or equal to 90% of their needs  MONITOR:   PO intake, Supplement acceptance, Labs, Weight trends, Skin, I & O's  REASON FOR ASSESSMENT:   Malnutrition Screening Tool    ASSESSMENT:   69 y.o. male with medical history significant of alcohol abuse, HTN, cirrhosis and GERD who is admitted for etoh intoxication.   Met with pt in room today. Pt is well known to nutrition department and this RD from multiple previous admits. Pt is a poor historian. Pt reports poor appetite and oral intake at baseline. Pt reports that he did not eat much breakfast today. Pt does report that he enjoys chocolate Ensure but he does not drink them regularly. RD will add supplements to help pt meet his estimated needs. Pt is at high refeed risk. Per chart, pt is weight stable at baseline.   Medications reviewed and include: aspirin, oscal w/ D, vitamin D, folic acid, heparin, MVI, protonix, miralax, thiamine, NaCl @125ml /hr  Labs reviewed: K 3.6 wnl, BUN 5(L), creat 0.60(L), P 2.6 wnl, Mg 1.7 wnl  NUTRITION - FOCUSED PHYSICAL EXAM:    Most Recent Value  Orbital Region Severe depletion  Upper Arm Region Moderate depletion  Thoracic and Lumbar Region Moderate depletion  Buccal Region Severe depletion   Temple Region Severe depletion  Clavicle Bone Region Moderate depletion  Clavicle and Acromion Bone Region Moderate depletion  Scapular Bone Region Moderate depletion  Dorsal Hand Moderate depletion  Patellar Region Severe depletion  Anterior Thigh Region Severe depletion  Posterior Calf Region Severe depletion  Edema (RD Assessment) None  Hair Reviewed  Eyes Reviewed  Mouth Reviewed  Skin Reviewed  Nails Reviewed     Diet Order:   Diet Order            DIET SOFT Room service appropriate? Yes; Fluid consistency: Thin  Diet effective now                EDUCATION NEEDS:   Education needs have been addressed  Skin:  Skin Assessment: Reviewed RN Assessment  Last BM:  pta  Height:   Ht Readings from Last 1 Encounters:  05/18/20 5' 4"  (1.626 m)    Weight:   Wt Readings from Last 1 Encounters:  05/18/20 53.1 kg    Ideal Body Weight:  59 kg  BMI:  Body mass index is 20.09 kg/m.  Estimated Nutritional Needs:   Kcal:  1600-1800kcal/day  Protein:  80-90g/day  Fluid:  1.3-1.6L/day  Koleen Distance MS, RD, LDN Please refer to [facility] for RD and/or RD on-call/weekend/after hours pager

## 2020-05-18 NOTE — ED Notes (Signed)
Pt awaiting bed assignment

## 2020-05-18 NOTE — Progress Notes (Signed)
*  PRELIMINARY RESULTS* Echocardiogram 2D Echocardiogram has been performed.  Sherrie Sport 05/18/2020, 8:39 AM
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
## 2020-05-18 NOTE — ED Notes (Signed)
Pt laying in bed calling out, pt is confused unsure of place or time. Pt denies any co pain or discomfort. Pt reoriented, monitor in place, awaiting admission.

## 2020-05-18 NOTE — ED Notes (Signed)
Report received from [facility] [place]. Patient care assumed. Patient/RN introduction complete. Will continue to monitor.

## 2020-05-19 DIAGNOSIS — F1010 Alcohol abuse, uncomplicated: Secondary | ICD-10-CM | POA: Diagnosis not present

## 2020-05-19 DIAGNOSIS — K219 Gastro-esophageal reflux disease without esophagitis: Secondary | ICD-10-CM | POA: Diagnosis not present

## 2020-05-19 DIAGNOSIS — W19XXXS Unspecified fall, sequela: Secondary | ICD-10-CM

## 2020-05-19 DIAGNOSIS — E43 Unspecified severe protein-calorie malnutrition: Secondary | ICD-10-CM | POA: Diagnosis not present

## 2020-05-19 LAB — CK: Total CK: 372 U/L (ref 49–397)

## 2020-05-19 MED ORDER — ALUM & MAG HYDROXIDE-SIMETH 200-200-20 MG/5ML PO SUSP
30.0000 mL | ORAL | Status: DC | PRN
  Administered 2020-05-19 – 2020-05-20 (×2): 30 mL via ORAL
  Filled 2020-05-19 (×2): qty 30

## 2020-05-19 MED ORDER — METOCLOPRAMIDE HCL 5 MG/5ML PO SOLN
10.0000 mg | Freq: Four times a day (QID) | ORAL | Status: DC | PRN
  Filled 2020-05-19: qty 10

## 2020-05-19 MED ORDER — ONDANSETRON HCL 4 MG/2ML IJ SOLN
4.0000 mg | Freq: Four times a day (QID) | INTRAMUSCULAR | Status: DC | PRN
  Administered 2020-05-19 (×3): 4 mg via INTRAVENOUS
  Filled 2020-05-19 (×3): qty 2

## 2020-05-19 NOTE — TOC Progression Note (Addendum)
Transition of Care (TOC) - Progression Note    Patient Details  Name: Douglas Peterson. MRN: [id number] Date of Birth: [birth year]  Transition of Care [facility]) CM/SW Contact  Zigmund Daniel Dorian Pod, RN Phone [place] 05/19/2020, 4:35 PM  Clinical Narrative:     Substance abuse counseling/education completed and TOC RN discuss the risk involved related to his ongoing health. Printable educational material provided with local resources for pt's participation.   Addendum: High Risk Assessment completed. Spoke with daughter [name] [phone] with update on pt's disposition.   Expected Discharge Plan: [place] (However this may change based upon PT consults) Barriers to Discharge: Barriers Unresolved (comment)  Expected Discharge Plan and Services Expected Discharge Plan: [place] (However this may change based upon PT consults) In-house Referral: Clinical Social Work Discharge Planning Services: CM Consult   Living arrangements for the past 2 months: Single Family Home                           HH Arranged: RN [facility] Agency: [place] [employer] Date [place]: 05/19/20   Representative spoke with at [place]: [place] ([place]) Interventions    Readmission Risk Interventions Readmission Risk Prevention Plan 05/19/2020 12/13/2019 12/12/2019  Transportation Screening Complete Complete Complete  PCP or Specialist Appt within 3-5 Days - (No Data) -  HRI or [place] - (No Data) -  Palliative Care Screening - Complete Complete  Medication Review (RN Care Manager) Complete Complete Complete  PCP or Specialist appointment within 3-5 days of discharge Complete - -  [place] or Home Care Consult Complete - -  SW Recovery Care/Counseling Consult Complete - -  Palliative Care Screening Not Applicable - -  [place] Not Applicable - -  Some recent data might be hidden

## 2020-05-19 NOTE — TOC Initial Note (Signed)
Transition of Care (TOC) - Initial/Assessment Note    Patient Details  Name: Douglas Peterson. MRN: [id number] Date of Birth: 1951-05-10  Transition of Care [facility]) CM/SW Contact:    Harriet Masson, RN Phone [phone] 05/19/2020, 3:47 PM  Clinical Narrative:              Pt has had several ED visits and maybe unsafe to return home with the limited available assistance. TOC RN spoke with Dr. Posey Pronto concerning the current recommendations for home with PT. Pt has been accepted by [facility] if pt is discharged home. Dr. Posey Pronto consult with PT and will re-evaluate pt's disposition. Pt is not stable for discharge today. TOC RN attempted outreach call to daughter Douglas Peterson [phone] left HIPAA voice message (awaiting call back).  TOC will continue to follow for discharge disposition.  Expected Discharge Plan: Home/Self Care Barriers to Discharge: Barriers Unresolved (comment) (Patient has active substance use; does not want to stop)   Patient Goals and CMS Choice        Expected Discharge Plan and Services Expected Discharge Plan: Home/Self Care In-house Referral: Clinical Social Work     Living arrangements for the past 2 months: Single Family Home                                      Prior Living Arrangements/Services Living arrangements for the past 2 months: Single Family Home Lives with:: Self Patient language and need for interpreter reviewed:: Yes Do you feel safe going back to the place where you live?: Yes      Need for Family Participation in Patient Care: Yes (Comment) Care giver support system in place?: Yes (comment)   Criminal Activity/Legal Involvement Pertinent to Current Situation/Hospitalization: No - Comment as needed  Activities of Daily Living Home Assistive Devices/Equipment: Cane (specify quad or straight) ADL Screening (condition at time of admission) Is the patient deaf or have difficulty hearing?: No Does the patient have  difficulty seeing, even when wearing glasses/contacts?: No Patient able to express need for assistance with ADLs?: Yes Weakness of Legs: Both Weakness of Arms/Hands: Both  Permission Sought/Granted                  Emotional Assessment Appearance:: Appears stated age Attitude/Demeanor/Rapport: Unable to Assess Affect (typically observed): Unable to Assess Orientation: : Oriented to Self, Oriented to Place Alcohol / Substance Use: Not Applicable Psych Involvement: No (comment)  Admission diagnosis:  Alcohol abuse [F10.10] ETOH abuse [F10.10] Alcohol withdrawal syndrome without complication ([place]) [B51.025] Patient Active Problem List   Diagnosis Date Noted  . Abnormal LFTs 04/06/2020  . Fall 04/06/2020  . Rhabdomyolysis 04/06/2020  . Elevated lactic acid level 04/06/2020  . Sepsis ([place]) 03/17/2020  . Constipation 03/17/2020  . AF (paroxysmal atrial fibrillation) ([place]) 03/17/2020  . Severe protein-calorie malnutrition ([place]) 02/25/2020  . Left inguinal hernia 01/31/2020  . Encounter for competency evaluation   . GERD (gastroesophageal reflux disease) 01/17/2020  . Depression 01/17/2020  . Esophagitis   . Nausea without vomiting   . Weakness   . Tremor   . Pancytopenia ([place])   . Hypomagnesemia   . Alcoholic cirrhosis of liver without ascites ([place])   . Thrombocytopenia ([place])   . Non-traumatic rhabdomyolysis   . Alcohol withdrawal delirium ([place]) 12/08/2019  . ETOH abuse   . Alcohol intoxication, uncomplicated ([name])   . 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 Hypokalemia 09/27/2019  . Dysphagia 09/27/2019  . Alcohol withdrawal delirium, acute, hyperactive ([place]) 09/21/2019  . Pressure injury of skin 08/15/2019  . Goals of care, counseling/discussion   . Palliative care by specialist   . Alcohol withdrawal ([place]) 03/20/2019  . Low back pain 11/15/2018  . Acute metabolic encephalopathy 66/81/5947  . Delirium tremens ([place]) 03/08/2018   . Malnutrition of moderate degree 11/24/2017  . HTN (hypertension) 09/16/2017  . Hypothermia 12/17/2016  . Alcohol use   . Diarrhea   . Hyponatremia 07/09/2015   PCP:  [name], MD Pharmacy:   [facility] [address] [place] [address] [place] [address] Phone: [phone] Fax: [phone]     Social Determinants of Health (SDOH) Interventions    Readmission Risk Interventions Readmission Risk Prevention Plan 12/13/2019 12/12/2019  Transportation Screening Complete Complete  PCP or Specialist Appt within 3-5 Days (No Data) -  HRI or [place] (No Data) -  Palliative Care Screening Complete Complete  Medication Review (RN Care Manager) Complete Complete  Some recent data might be hidden
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
## 2020-05-20 DIAGNOSIS — W19XXXS Unspecified fall, sequela: Secondary | ICD-10-CM | POA: Diagnosis not present

## 2020-05-20 DIAGNOSIS — E43 Unspecified severe protein-calorie malnutrition: Secondary | ICD-10-CM | POA: Diagnosis not present

## 2020-05-20 DIAGNOSIS — K219 Gastro-esophageal reflux disease without esophagitis: Secondary | ICD-10-CM | POA: Diagnosis not present

## 2020-05-20 DIAGNOSIS — F1010 Alcohol abuse, uncomplicated: Secondary | ICD-10-CM | POA: Diagnosis not present

## 2020-05-20 MED ORDER — ACETAMINOPHEN 325 MG PO TABS
650.0000 mg | ORAL_TABLET | Freq: Three times a day (TID) | ORAL | Status: DC | PRN
  Administered 2020-05-20: 650 mg via ORAL

## 2020-05-20 MED ORDER — ACETAMINOPHEN 650 MG RE SUPP: 650.0000 mg | Freq: Three times a day (TID) | RECTAL | Status: DC | PRN
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
## 2020-05-21 DIAGNOSIS — E43 Unspecified severe protein-calorie malnutrition: Secondary | ICD-10-CM | POA: Diagnosis not present

## 2020-05-21 DIAGNOSIS — K219 Gastro-esophageal reflux disease without esophagitis: Secondary | ICD-10-CM | POA: Diagnosis not present

## 2020-05-21 DIAGNOSIS — W19XXXS Unspecified fall, sequela: Secondary | ICD-10-CM | POA: Diagnosis not present

## 2020-05-21 DIAGNOSIS — F1010 Alcohol abuse, uncomplicated: Secondary | ICD-10-CM | POA: Diagnosis not present

## 2020-05-21 MED ORDER — THIAMINE HCL 100 MG PO TABS
100.0000 mg | ORAL_TABLET | Freq: Every day | ORAL | Status: DC
  Administered 2020-05-22 – 2020-05-28 (×7): 100 mg via ORAL
  Filled 2020-05-21 (×7): qty 1
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
## 2020-05-21 NOTE — Care Management Important Message (Signed)
Important Message  Patient Details  Name: Douglas Peterson. MRN: [id number] Date of Birth: [birth year]   Medicare Important Message Given:  Yes     [name] 05/21/2020, 12:33 PM
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
## 2020-05-22 DIAGNOSIS — E43 Unspecified severe protein-calorie malnutrition: Secondary | ICD-10-CM | POA: Diagnosis not present

## 2020-05-22 DIAGNOSIS — W19XXXS Unspecified fall, sequela: Secondary | ICD-10-CM | POA: Diagnosis not present

## 2020-05-22 DIAGNOSIS — K219 Gastro-esophageal reflux disease without esophagitis: Secondary | ICD-10-CM | POA: Diagnosis not present

## 2020-05-22 DIAGNOSIS — F1010 Alcohol abuse, uncomplicated: Secondary | ICD-10-CM | POA: Diagnosis not present
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
## 2020-05-22 NOTE — TOC Progression Note (Addendum)
Transition of Care (TOC) - Progression Note    Patient Details  Name: Douglas Peterson. MRN: [id number] Date of Birth: 1950-11-22  Transition of Care [facility]) CM/SW Contact  Beverly Sessions, RN Phone Number: 05/22/2020, 9:20 AM  Clinical Narrative:    Spoke with daughter Douglas Peterson at length today.  She is aware that with the current recommendations patient will not qualify to go to SNF level of care with his [facility]  Confirmed that patient does not have Medicaid.   Douglas Peterson states that she is aware that he going to a facility would be a private pay scenario, however the patient has "plenty of money" to private pay.  Douglas Peterson is willing to make medical decision for the patient, but she is also aware that she will have to work with dss or lawyer in order to also gain access to the financial component.  She is interested in ALF level of care in the [place] area.  I have requested that she provide me names of ones that she is interested in.   Offered Wendy resources on [place], she states that she has already been provided with lawyer information to assist her .   Daughter, TOC, MD, an psych to meet tomorrow at 1:15 to discuss further options.    Expected Discharge Plan: [place] (However this may change based upon PT consults) Barriers to Discharge: Barriers Unresolved (comment)  Expected Discharge Plan and Services Expected Discharge Plan: [place] (However this may change based upon PT consults) In-house Referral: Clinical Social Work Discharge Planning Services: CM Consult   Living arrangements for the past 2 months: Single Family Home                           HH Arranged: RN [facility] Agency: [place] Date [place]: 05/19/20   Representative spoke with at [place]: [place] ([place]) Interventions    Readmission Risk Interventions Readmission Risk Prevention Plan 05/19/2020  12/13/2019 12/12/2019  Transportation Screening Complete Complete Complete  PCP or Specialist Appt within 3-5 Days - (No Data) -  HRI or [place] - (No Data) -  Palliative Care Screening - Complete Complete  Medication Review (RN Care Manager) Complete Complete Complete  PCP or Specialist appointment within 3-5 days of discharge Complete - -  [place] or Home Care Consult Complete - -  SW Recovery Care/Counseling Consult Complete - -  Palliative Care Screening Not Applicable - -  [place] Not Applicable - -  Some recent data might be hidden
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
## 2020-05-22 NOTE — NC FL2 (Signed)
[place] LEVEL OF CARE SCREENING TOOL     IDENTIFICATION  Patient Name: Douglas Peterson Nurse. Birthdate: 04/09/1951 Sex: male Admission Date (Current Location): 05/17/2020  [facility] and [place] Number:  Engineering geologist and Address:         Provider Number: [id number]  Attending Physician Name and Address:  Fritzi Mandes, MD  Relative Name and Phone Number:       Current Level of Care: Hospital Recommended Level of Care: [place] Prior Approval Number:    Date Approved/Denied:   PASRR Number: [id number]  Discharge Plan: SNF    Current Diagnoses: Patient Active Problem List   Diagnosis Date Noted  . Abnormal LFTs 04/06/2020  . Fall 04/06/2020  . Rhabdomyolysis 04/06/2020  . Elevated lactic acid level 04/06/2020  . Sepsis ([place]) 03/17/2020  . Constipation 03/17/2020  . AF (paroxysmal atrial fibrillation) ([place]) 03/17/2020  . Severe protein-calorie malnutrition ([place]) 02/25/2020  . Left inguinal hernia 01/31/2020  . Encounter for competency evaluation   . GERD (gastroesophageal reflux disease) 01/17/2020  . Depression 01/17/2020  . Esophagitis   . Nausea without vomiting   . Weakness   . Tremor   . Pancytopenia ([place])   . Hypomagnesemia   . Alcoholic cirrhosis of liver without ascites ([place])   . Thrombocytopenia ([place])   . Non-traumatic rhabdomyolysis   . Alcohol withdrawal delirium ([place]) 12/08/2019  . ETOH abuse   . Alcohol intoxication, uncomplicated ([place])   . 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 Hypokalemia 09/27/2019  . Dysphagia 09/27/2019  . Alcohol withdrawal delirium, acute, hyperactive ([place]) 09/21/2019  . Pressure injury of skin 08/15/2019  . Goals of care, counseling/discussion   . Palliative care by specialist   . Alcohol withdrawal ([place]) 03/20/2019  . Low back pain 11/15/2018  . Acute metabolic encephalopathy 13/06/6577  . Delirium tremens ([place]) 03/08/2018  . Malnutrition of  moderate degree 11/24/2017  . HTN (hypertension) 09/16/2017  . Hypothermia 12/17/2016  . Alcohol use   . Diarrhea   . Hyponatremia 07/09/2015    Orientation RESPIRATION BLADDER Height & Weight     Self, Place  Normal External catheter, Continent Weight: 53.1 kg Height:  5' 4"  (162.6 cm)  BEHAVIORAL SYMPTOMS/MOOD NEUROLOGICAL BOWEL NUTRITION STATUS      Continent Diet (dys 3)  AMBULATORY STATUS COMMUNICATION OF NEEDS Skin   Extensive Assist Verbally PU Stage and Appropriate Care                       Personal Care Assistance Level of Assistance              Functional Limitations Info             SPECIAL CARE FACTORS FREQUENCY  PT (By licensed PT), OT (By licensed OT)                    Contractures Contractures Info: Not present    Additional Factors Info  Code Status, Allergies Code Status Info: Full Allergies Info: NKDA           Current Medications (05/22/2020):  This is the current hospital active medication list Current Facility-Administered Medications  Medication Dose Route Frequency Provider Last Rate Last Admin  . 0.9 %  sodium chloride infusion  250 mL Intravenous PRN Para Skeans, MD      . acetaminophen (TYLENOL) tablet 650 mg  650 mg Oral TID PRN Fritzi Mandes, MD  650 mg at 05/20/20 1159  . alum & mag hydroxide-simeth (MAALOX/MYLANTA) 200-200-20 MG/5ML suspension 30 mL  30 mL Oral Q4H PRN Fritzi Mandes, MD   30 mL at 05/20/20 1159  . aspirin EC tablet 81 mg  81 mg Oral Daily Fritzi Mandes, MD   81 mg at 05/22/20 0916  . atenolol (TENORMIN) tablet 25 mg  25 mg Oral BID Fritzi Mandes, MD   25 mg at 05/22/20 0920  . calcium-vitamin D (OSCAL WITH D) 500-200 MG-UNIT per tablet 1 tablet  1 tablet Oral Q breakfast Fritzi Mandes, MD   1 tablet at 05/22/20 0915  . Chlorhexidine Gluconate Cloth 2 % PADS 6 each  6 each Topical Q0600 Sharion Settler, NP   6 each at 05/21/20 [id number]  . cholecalciferol (VITAMIN D) tablet 5,000 Units  5,000 Units Oral Daily  Fritzi Mandes, MD   5,000 Units at 05/22/20 0915  . doxepin (SINEQUAN) capsule 10 mg  10 mg Oral QHS PRN Fritzi Mandes, MD   10 mg at 05/21/20 2010  . feeding supplement (ENSURE ENLIVE) (ENSURE ENLIVE) liquid 237 mL  237 mL Oral TID BM Fritzi Mandes, MD   237 mL at 05/22/20 1342  . folic acid (FOLVITE) tablet 1 mg  1 mg Oral Daily Florina Ou V, MD   1 mg at 05/22/20 0915  . heparin injection 5,000 Units  5,000 Units Subcutaneous Q12H Para Skeans, MD   5,000 Units at 05/22/20 0915  . metoCLOPramide (REGLAN) 5 MG/5ML solution 10 mg  10 mg Oral Q6H PRN Sharion Settler, NP      . multivitamin with minerals tablet 1 tablet  1 tablet Oral Daily Para Skeans, MD   1 tablet at 05/22/20 0915  . ondansetron (ZOFRAN) injection 4 mg  4 mg Intravenous Q6H PRN Sharion Settler, NP   4 mg at 05/19/20 2341  . pantoprazole (PROTONIX) EC tablet 40 mg  40 mg Oral Daily Fritzi Mandes, MD   40 mg at 05/22/20 0916  . PARoxetine (PAXIL) tablet 30 mg  30 mg Oral Daily Fritzi Mandes, MD   30 mg at 05/22/20 0920  . polyethylene glycol (MIRALAX / GLYCOLAX) packet 17 g  17 g Oral Daily Fritzi Mandes, MD   17 g at 05/22/20 1342  . sodium chloride flush (NS) 0.9 % injection 3 mL  3 mL Intravenous Q12H Para Skeans, MD   3 mL at 05/22/20 0926  . sodium chloride flush (NS) 0.9 % injection 3 mL  3 mL Intravenous PRN Para Skeans, MD   3 mL at 05/20/20 1131  . thiamine tablet 100 mg  100 mg Oral Daily Dallie Piles, RPH   100 mg at 05/22/20 3716     Discharge Medications: Please see discharge summary for a list of discharge medications.  Relevant Imaging Results:  Relevant Lab Results:   Additional Information SS# [id number]  Beverly Sessions, RN

## 2020-05-23 ENCOUNTER — Inpatient Hospital Stay: Payer: Medicare HMO

## 2020-05-23 DIAGNOSIS — G9341 Metabolic encephalopathy: Secondary | ICD-10-CM

## 2020-05-23 DIAGNOSIS — R7989 Other specified abnormal findings of blood chemistry: Secondary | ICD-10-CM

## 2020-05-23 DIAGNOSIS — I10 Essential (primary) hypertension: Secondary | ICD-10-CM

## 2020-05-23 DIAGNOSIS — I480 Paroxysmal atrial fibrillation: Secondary | ICD-10-CM

## 2020-05-23 DIAGNOSIS — F09 Unspecified mental disorder due to known physiological condition: Secondary | ICD-10-CM

## 2020-05-23 DIAGNOSIS — M6282 Rhabdomyolysis: Secondary | ICD-10-CM

## 2020-05-23 DIAGNOSIS — R4182 Altered mental status, unspecified: Secondary | ICD-10-CM

## 2020-05-23 MED ORDER — BACLOFEN 10 MG PO TABS
5.0000 mg | ORAL_TABLET | Freq: Three times a day (TID) | ORAL | Status: DC | PRN
  Administered 2020-05-23 – 2020-05-28 (×10): 5 mg via ORAL
  Filled 2020-05-23 (×13): qty 0.5

## 2020-05-23 MED ORDER — GADOBUTROL 1 MMOL/ML IV SOLN
5.0000 mL | Freq: Once | INTRAVENOUS | Status: AC | PRN
  Administered 2020-05-23: 5 mL via INTRAVENOUS

## 2020-05-23 NOTE — Progress Notes (Addendum)
Patient ID: Douglas Pesa., male   DOB: 04-22-1951, 69 y.o.   MRN: [id number]   Rama Anne Fu   MD---  Asked to write a note again on Douglas Peterson regarding his inability to make medical and other decisions.   Please see my note on 05/18/20 outlining detailed issues already  Once again:  Douglas Peterson does not meet criteria for capacity for consent.   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  " I am not mentally ill"  I do not drink that much "  My depression is not present"---I am doing well at home"---I have not been in dangerous situations"  " I can take care of myself "  With or without intoxication at home  He cannot care for himself.  He is not able ambulate, and do his ADL's  ---  Severe intoxication and even without intoxication--he  places himself at severe risk of danger to his life. And living status He is noted to at time remain in his own urine and feces at home.  He sits in his car at high temperatures and remains there with or without intoxication at times. --a few of many examples over the past months  Thus he is does not have capacity for consent and cannot make his own medical decisions.  Even when sober he does not understand the seriousness of placing himself in danger ---and the risk of clinical deterioration and demise if left alone and to his own accord

## 2020-05-23 NOTE — Progress Notes (Signed)
Patient ID: Douglas Pesa., male   DOB: January 15, 1951, 69 y.o.   MRN: [id number] [facility] PROGRESS NOTE  Douglas Pesa. ALP:[id number] DOB: 12-Apr-1951 DOA: 05/17/2020 PCP: [name], MD  HPI/Subjective: Patient states that he feels okay.  He states he does not need the rehab.  He states that he is going to stop his drinking.  He feels physically okay.  Objective: Vitals:   05/23/20 0448 05/23/20 1214  BP: 128/69 114/77  Pulse: 71 65  Resp: 14 16  Temp: 97.8 F (36.6 C) 97.7 F (36.5 C)  SpO2: 97% 97%    Intake/Output Summary (Last 24 hours) at 05/23/2020 1343 Last data filed at 05/23/2020 1116 Gross per 24 hour  Intake 240 ml  Output 800 ml  Net -560 ml   Filed Weights   05/17/20 1412 05/18/20 0313  Weight: 56.2 kg 53.1 kg    ROS: Review of Systems  Respiratory: Negative for shortness of breath.   Cardiovascular: Negative for chest pain.  Gastrointestinal: Negative for abdominal pain.   Exam: Physical Exam HENT:     Head: Normocephalic.  Eyes:     General: Lids are normal.     Extraocular Movements: Extraocular movements intact.     Pupils: Pupils are equal, round, and reactive to light.  Cardiovascular:     Rate and Rhythm: Normal rate and regular rhythm.     Heart sounds: Normal heart sounds, S1 normal and S2 normal.  Pulmonary:     Breath sounds: No decreased breath sounds, wheezing, rhonchi or rales.  Abdominal:     Palpations: Abdomen is soft.     Tenderness: There is no abdominal tenderness.  Musculoskeletal:     Right ankle: No swelling.     Left ankle: No swelling.  Skin:    General: Skin is warm.     Findings: No rash.  Neurological:     Mental Status: He is alert.     Comments: Right arm contracted and cannot extend his fingers.  Able to flex slightly at his elbow and shoulder.  But limited motion.  Able to straight leg raise without a problem.       Data Reviewed: Basic Metabolic Panel: Recent Labs  Lab 05/17/20 1803  05/18/20 0009  NA 139 140  K 3.9 3.6  CL 98 103  CO2 28 27  GLUCOSE 88 98  BUN <5* 5*  CREATININE 0.54* 0.60*  CALCIUM 8.3* 8.3*  MG  --  1.7  PHOS  --  2.6   Liver Function Tests: Recent Labs  Lab 05/17/20 1803 05/18/20 0009  AST 57* 50*  ALT 39 32  ALKPHOS 81 83  BILITOT 0.7 0.5  PROT 7.6 7.0  ALBUMIN 4.2 3.8   CBC: Recent Labs  Lab 05/17/20 1333 05/18/20 0009  WBC 10.8* 9.9  NEUTROABS 2.2  --   HGB 16.0 14.0  HCT 46.7 39.6  MCV 91.2 88.8  PLT 275 218   Cardiac Enzymes: Recent Labs  Lab 05/18/20 0009 05/19/20 0409  CKTOTAL 731* 372   BNP (last 3 results) Recent Labs    01/18/20 1056  BNP 185.0*    CBG: Recent Labs  Lab 05/17/20 1324 05/18/20 0310  GLUCAP 87 113*    Recent Results (from the past 240 hour(s))  SARS Coronavirus 2 by RT PCR (hospital order, performed in [place] hospital lab) Nasopharyngeal Nasopharyngeal Swab     Status: None   Collection Time: 05/17/20 11:25 PM   Specimen: Nasopharyngeal Swab  Result  Value Ref Range Status   SARS Coronavirus 2 NEGATIVE NEGATIVE Final    Comment: (NOTE) SARS-CoV-2 target nucleic acids are NOT DETECTED.  The SARS-CoV-2 RNA is generally detectable in upper and lower respiratory specimens during the acute phase of infection. The lowest concentration of SARS-CoV-2 viral copies this assay can detect is 250 copies / mL. A negative result does not preclude SARS-CoV-2 infection and should not be used as the sole basis for treatment or other patient management decisions.  A negative result may occur with improper specimen collection / handling, submission of specimen other than nasopharyngeal swab, presence of viral mutation(s) within the areas targeted by this assay, and inadequate number of viral copies (<250 copies / mL). A negative result must be combined with clinical observations, patient history, and epidemiological information.  Fact Sheet for Patients:    StrictlyIdeas.no  Fact Sheet for Healthcare Providers: BankingDealers.co.za  This test is not yet approved or  cleared by the [place] FDA and has been authorized for detection and/or diagnosis of SARS-CoV-2 by FDA under an Emergency Use Authorization (EUA).  This EUA will remain in effect (meaning this test can be used) for the duration of the COVID-19 declaration under Section 564(b)(1) of the Act, 21 U.S.C. section 360bbb-3(b)(1), unless the authorization is terminated or revoked sooner.  Performed at [facility], [address] [place], [place] [address]   MRSA PCR Screening     Status: None   Collection Time: 05/18/20  3:10 AM   Specimen: Nasopharyngeal  Result Value Ref Range Status   MRSA by PCR NEGATIVE NEGATIVE Final    Comment:        The GeneXpert MRSA Assay (FDA approved for NASAL specimens only), is one component of a comprehensive MRSA colonization surveillance program. It is not intended to diagnose MRSA infection nor to guide or monitor treatment for MRSA infections. Performed at [facility], [address] [place], [place] [address]       Scheduled Meds: . aspirin EC  81 mg Oral Daily  . atenolol  25 mg Oral BID  . calcium-vitamin D  1 tablet Oral Q breakfast  . Chlorhexidine Gluconate Cloth  6 each Topical Q0600  . cholecalciferol  5,000 Units Oral Daily  . feeding supplement (ENSURE ENLIVE)  237 mL Oral TID BM  . folic acid  1 mg Oral Daily  . heparin  5,000 Units Subcutaneous Q12H  . multivitamin with minerals  1 tablet Oral Daily  . pantoprazole  40 mg Oral Daily  . PARoxetine  30 mg Oral Daily  . polyethylene glycol  17 g Oral Daily  . sodium chloride flush  3 mL Intravenous Q12H  . thiamine  100 mg Oral Daily   Continuous Infusions: . sodium chloride      Assessment/Plan:  1. Alcohol abuse with acute metabolic encephalopathy.  Neurology recommended an MRI of  the brain, B12, TSH, ammonia, B1. 2. The psychiatry team deemed him incompetent to make medical decisions at this time.  Transitional care team looking into placement options. 3. Acute rhabdomyolysis.  CPK improved 4. Essential hypertension on atenolol 5. Paroxysmal atrial fibrillation on atenolol.  Not on any anticoagulation secondary to fall risk 6. Elevated liver enzymes secondary to alcohol abuse 7. Severe protein calorie malnutrition  Pressure Injury 04/30/20 Buttocks Right;Left Stage 2 -  Partial thickness loss of dermis presenting as a shallow open injury with a red, pink wound bed without slough. (Active)  04/30/20 2222  Location: Buttocks  Location Orientation: Right;Left  Staging: Stage 2 -  Partial thickness loss of dermis presenting as a shallow open injury with a red, pink wound bed without slough.  Wound Description (Comments):   Present on Admission:        Code Status:     Code Status Orders  (From admission, onward)         Start     Ordered   05/17/20 2340  Full code  Continuous        05/17/20 2342        Code Status History    Date Active Date Inactive Code Status Order ID Comments User Context   04/06/2020 2024 04/19/2020 2204 Full Code [id number]  Ivor Costa, MD Inpatient   03/17/2020 1930 03/26/2020 1933 Full Code [id number]  Edwin Dada, MD ED   02/23/2020 2202 03/01/2020 1914 Full Code [id number]  Mansy, Arvella Merles, MD ED   01/17/2020 1512 01/30/2020 1833 Full Code [id number]  Ivor Costa, MD Inpatient   12/09/2019 1237 12/13/2019 1557 Full Code [id number]  Toy Baker, MD ED   12/07/2019 0618 12/09/2019 1237 Full Code [id number]  Gregor Hams, MD ED   09/20/2019 2314 10/03/2019 1741 Full Code [id number]  [name], MD ED   08/14/2019 1932 08/25/2019 2145 Full Code [id number]  Loletha Grayer, MD ED   08/14/2019 1840 08/14/2019 1932 Full Code [id number]  Blake Divine, MD ED   06/12/2019 2308 06/21/2019 2018 Full Code [id number]  Dustin Flock, MD  Inpatient   03/29/2019 1337 04/01/2019 1939 DNR [id number]  Philis Pique, NP Inpatient   03/20/2019 1510 03/29/2019 1337 Full Code [id number]  Mayo, Pete Pelt, MD Inpatient   03/20/2019 0507 03/20/2019 1509 Full Code [id number]  Gregor Hams, MD ED   11/16/2018 0101 11/23/2018 1455 Full Code [id number]  Gladstone Lighter, MD ED   10/12/2018 1355 10/13/2018 1839 Full Code [id number]  Arta Silence, MD Inpatient   10/12/2018 0039 10/12/2018 1354 Full Code [id number]  Orbie Pyo, MD ED   05/16/2018 1009 05/16/2018 1611 Full Code [id number]  Schuyler Amor, MD ED   03/12/2018 1605 03/16/2018 2212 DNR [id number]  Jimmy Footman, NP Inpatient   03/08/2018 0950 03/12/2018 1605 Full Code [id number]  Harrie Foreman, MD Inpatient   03/07/2018 1607 03/08/2018 0950 Full Code [id number]  [name], MD ED   11/23/2017 1502 11/26/2017 1642 Full Code [id number]  Vaughan Basta, MD Inpatient   09/22/2017 1202 09/22/2017 2017 Full Code [id number]  Nena Polio, MD ED   09/16/2017 2344 09/18/2017 1550 Full Code [id number]  Lance Coon, MD Inpatient   09/16/2017 2037 09/16/2017 2343 Full Code [id number]  Orbie Pyo, MD ED   12/22/2016 1126 12/25/2016 1910 DNR [id number]  [name], MD Inpatient   12/17/2016 1147 12/22/2016 1126 Full Code [id number]  Henreitta Leber, MD Inpatient   09/12/2015 2108 09/15/2015 1501 Full Code [id number]  Henreitta Leber, MD Inpatient   07/09/2015 1559 07/11/2015 2110 Full Code [id number]  Bettey Costa, MD Inpatient   Advance Care Planning Activity     Family Communication: Spoke with daughter on the phone Disposition Plan: Status is: Inpatient  Dispo: The patient is from: Home              Anticipated d/c is to: Rehab and placement.              Anticipated d/c date is: Psychiatry deemed him incompetent to make medical decisions awaiting transitional care team to obtain placement  options              Patient currently unsafe to be  discharged home at this point in time since he is incompetent to make good medical decisions for himself at this point.  Consultants:  Psychiatry  Time spent: 28 minutes  [place]

## 2020-05-23 NOTE — TOC Progression Note (Signed)
Transition of Care (TOC) - Progression Note    Patient Details  Name: Douglas Alderman Jr. MRN: [id number] Date of Birth: 04/22/1951  Transition of Care (TOC) CM/SW Contact  ,  T, RN Phone Number: 05/23/2020, 4:32 PM  Clinical Narrative:    Met with patient, daughter, psch and MD at bedside.  Discussed discharge disposition.   PT is recommending SNF.  Patient (who lack capacity) is in agreement, as well as daughter.  Their preference on facility is [facility] in [place].   Spoke with Chris at [facility], they are able to offer.  He has started insurance auth     Expected Discharge Plan: Home w Home Health Services (However this may change based upon PT consults) Barriers to Discharge: Barriers Unresolved (comment)  Expected Discharge Plan and Services Expected Discharge Plan: Home w Home Health Services (However this may change based upon PT consults) In-house Referral: Clinical Social Work Discharge Planning Services: CM Consult   Living arrangements for the past 2 months: Single Family Home                           HH Arranged: RN HH Agency: [facility] Date HH Agency Contacted: 05/19/20   Representative spoke with at HH Agency: Cheryl   Social Determinants of Health (SDOH) Interventions    Readmission Risk Interventions Readmission Risk Prevention Plan 05/19/2020 12/13/2019 12/12/2019  Transportation Screening Complete Complete Complete  PCP or Specialist Appt within 3-5 Days - (No Data) -  HRI or Home Care Consult - (No Data) -  Palliative Care Screening - Complete Complete  Medication Review (RN Care Manager) Complete Complete Complete  PCP or Specialist appointment within 3-5 days of discharge Complete - -  HRI or Home Care Consult Complete - -  SW Recovery Care/Counseling Consult Complete - -  Palliative Care Screening Not Applicable - -  Skilled Nursing Facility Not Applicable - -  Some recent data might be hidden   

## 2020-05-23 NOTE — Progress Notes (Signed)
eeg completed ° °

## 2020-05-23 NOTE — TOC Transition Note (Signed)
Transition of Care [facility]) - CM/SW Discharge Note   Patient Details  Name: Douglas Peterson. MRN: [id number] Date of Birth: 12/26/1950  [facility] CM/SW Contact:  Beverly Sessions, RN Phone Number: 05/23/2020, 4:31 PM   Clinical Narrative:    TOC spoke with daughter [name] and notified her that [facility] is able to offer bed. She is in agreement. She has additional questions about guardianship and process and would like speak with Roni from [place] spoke with APS case worker [name].  Roni states that she has petitioning for guardianship today. She is in agreement with discharge plan to [facility] pending insurance auth.    TOC leadership updated   Final next level of care: Home/Self Care Barriers to Discharge: Barriers Unresolved (comment)   Patient Goals and CMS Choice        Discharge Placement                       Discharge Plan and Services In-house Referral: Clinical Social Work Discharge Planning Services: CM Consult                      HH Arranged: RN [place] Agency: [place] Date [place]: 05/19/20   Representative spoke with at [place]: [place] ([place]) Interventions     Readmission Risk Interventions Readmission Risk Prevention Plan 05/19/2020 12/13/2019 12/12/2019  Transportation Screening Complete Complete Complete  PCP or Specialist Appt within 3-5 Days - (No Data) -  HRI or [place] - (No Data) -  Palliative Care Screening - Complete Complete  Medication Review (RN Care Manager) Complete Complete Complete  PCP or Specialist appointment within 3-5 days of discharge Complete - -  [place] or Home Care Consult Complete - -  SW Recovery Care/Counseling Consult Complete - -  Palliative Care Screening Not Applicable - -  [place] Not Applicable - -  Some recent data might be hidden

## 2020-05-23 NOTE — Procedures (Signed)
ELECTROENCEPHALOGRAM REPORT   Patient: Douglas Peterson.       Room #: 220A-AA EEG No. ID: [id number] Age: 69 y.o.        Sex: male Requesting Physician: Leslye Peer Report Date:  05/23/2020        Interpreting Physician: Alexis Goodell  History: Lucah Petta. is an 69 y.o. male with altered mental status  Medications:  ASA, Tenormin, MVI  Conditions of Recording:  This is a 21 channel routine scalp EEG performed with bipolar and monopolar montages arranged in accordance to the international 10/20 system of electrode placement. One channel was dedicated to EKG recording.  The patient is in the awake state.  Description:  The waking background activity consists of a low voltage, symmetrical, fairly well organized, 9 Hz alpha activity, seen from the parieto-occipital and posterior temporal regions.  Low voltage fast activity, poorly organized, is seen anteriorly and is at times superimposed on more posterior regions.  A mixture of theta and alpha rhythms are seen from the central and temporal regions. The patient does not drowse or sleep. No epileptiform activity is noted.   Hyperventilation was not performed.  Intermittent photic stimulation was performed but failed to illicit any change in the tracing.    IMPRESSION: Normal electroencephalogram, awake and with activation procedures. There are no focal lateralizing or epileptiform features.   Alexis Goodell, MD Neurology [phone] 05/23/2020, 4:15 PM
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
## 2020-05-24 LAB — AMMONIA: Ammonia: 39 umol/L — ABNORMAL HIGH (ref 9–35)

## 2020-05-24 LAB — FOLATE: Folate: 28 ng/mL (ref >5.9)

## 2020-05-24 LAB — SEDIMENTATION RATE: Sed Rate: 15 mm/hr (ref 0–20)

## 2020-05-24 LAB — VITAMIN B12: Vitamin B-12: 517 pg/mL (ref 180–914)

## 2020-05-24 LAB — TSH: TSH: 5.486 u[IU]/mL — ABNORMAL HIGH (ref 0.350–4.500)

## 2020-05-24 MED ORDER — LACTULOSE 10 GM/15ML PO SOLN
20.0000 g | Freq: Every day | ORAL | Status: DC
  Administered 2020-05-24 – 2020-05-28 (×5): 20 g via ORAL
  Filled 2020-05-24 (×5): qty 30
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
## 2020-05-24 NOTE — Progress Notes (Signed)
Nutrition Follow Up Note   DOCUMENTATION CODES:   Severe malnutrition in context of social or environmental circumstances  INTERVENTION:   Ensure Enlive po TID, each supplement provides 350 kcal and 20 grams of protein  Magic cup TID with meals, each supplement provides 290 kcal and 9 grams of protein  MVI, thiamine and folic acid daily in setting of etoh abuse  Dysphagia 3 diet   Pt at high refeed risk; recommend monitor K, Mg and P labs daily as oral intake improves  NUTRITION DIAGNOSIS:   Severe Malnutrition related to social / environmental circumstances (etoh abuse) as evidenced by moderate to severe fat depletions, moderate to severe muscle depletions.  GOAL:   Patient will meet greater than or equal to 90% of their needs  -progressing   MONITOR:   PO intake, Supplement acceptance, Labs, Weight trends, Skin, I & O's  ASSESSMENT:   69 y.o. male with medical history significant of alcohol abuse, HTN, cirrhosis and GERD who is admitted for etoh intoxication.   Pt with improved appetite and oral intake; pt eating 100% of meals and drinking Ensure supplements. Recommend monitor electrolytes as pt is at refeed risk. No new weight since 7/16; will request weekly weights.   Medications reviewed and include: aspirin, oscal w/ D, vitamin D, folic acid, heparin, MVI, protonix, paxil, thiamine  Labs reviewed: ammonia- 39(H)  Diet Order:   Diet Order            DIET DYS 3 Room service appropriate? No; Fluid consistency: Thin  Diet effective now                EDUCATION NEEDS:   Education needs have been addressed  Skin:  Skin Assessment: Reviewed RN Assessment  Last BM:  7/22- TYPE 2  Height:   Ht Readings from Last 1 Encounters:  05/18/20 5' 4"  (1.626 m)    Weight:   Wt Readings from Last 1 Encounters:  05/18/20 53.1 kg    Ideal Body Weight:  59 kg  BMI:  Body mass index is 20.09 kg/m.  Estimated Nutritional Needs:   Kcal:   1600-1800kcal/day  Protein:  80-90g/day  Fluid:  1.3-1.6L/day  Koleen Distance MS, RD, LDN Please refer to [facility] for RD and/or RD on-call/weekend/after hours pager

## 2020-05-24 NOTE — Progress Notes (Signed)
Subjective: No new complaints.    Objective: Current vital signs: BP (!) 139/74   Pulse 66   Temp 97.8 F (36.6 C) (Oral)   Resp 16   Ht _0  (1.626 m)   Wt 54.2 kg   SpO2 96%   BMI 20.51 kg/m  Vital signs in last 24 hours: Temp:  [97.5 F (36.4 C)-98 F (36.7 C)] 97.8 F (36.6 C) (07/22 1207) Pulse Rate:  [55-70] 66 (07/22 1207) Resp:  [16-18] 16 (07/22 1207) BP: (125-152)/(69-81) 139/74 (07/22 1207) SpO2:  [96 %-98 %] 96 % (07/22 1207) Weight:  [54.2 kg] 54.2 kg (07/22 1109)  Intake/Output from previous day: 07/21 0701 - 07/22 0700 In: 240 [P.O.:240] Out: 855 [Urine:855] Intake/Output this shift: Total I/O In: -  Out: 600 [Urine:600] Nutritional status:  Diet Order            DIET DYS 3 Room service appropriate? No; Fluid consistency: Thin  Diet effective now                 Neurologic Exam: Mental Status: Alert, oriented X 3 Speech fluent.  Able to follow commands without difficulty Cranial Nerves: II: Visual fields grossly normal, pupils equal, round, reactive to light and accommodation III,IV, VI: ptosis not present, extra-ocular motions intact bilaterally V,VII: smile symmetric, facial light touch sensation normal bilaterally VIII: hearing normal bilaterally IX,X: gag reflex present XI: bilateral shoulder shrug XII: midline tongue extension Motor: Right :  Upper extremity   Increased tone with decreased ROM        Left:     Upper extremity   5/5             Lower extremity   5/5                                                                          Lower extremity   5/5 Tone and bulk:normal tone throughout; no atrophy noted Sensory: Pinprick and light touch decreased in th RUE   Lab Results: Basic Metabolic Panel: Recent Labs  Lab 05/17/20 1803 05/18/20 0009  NA 139 140  K 3.9 3.6  CL 98 103  CO2 28 27  GLUCOSE 88 98  BUN <5* 5*  CREATININE 0.54* 0.60*  CALCIUM 8.3* 8.3*  MG  --  1.7  PHOS  --  2.6    Liver Function  Tests: Recent Labs  Lab 05/17/20 1803 05/18/20 0009  AST 57* 50*  ALT 39 32  ALKPHOS 81 83  BILITOT 0.7 0.5  PROT 7.6 7.0  ALBUMIN 4.2 3.8   No results for input(s): LIPASE, AMYLASE in the last 168 hours. Recent Labs  Lab 05/24/20 0550  AMMONIA 39*    CBC: Recent Labs  Lab 05/17/20 1333 05/18/20 0009  WBC 10.8* 9.9  NEUTROABS 2.2  --   HGB 16.0 14.0  HCT 46.7 39.6  MCV 91.2 88.8  PLT 275 218    Cardiac Enzymes: Recent Labs  Lab 05/18/20 0009 05/19/20 0409  CKTOTAL 731* 372    Lipid Panel: No results for input(s): CHOL, TRIG, HDL, CHOLHDL, VLDL, LDLCALC in the last 168 hours.  CBG: Recent Labs  Lab 05/18/20 0310  GLUCAP 113*    Microbiology: Results  for orders placed or performed during the hospital encounter of 05/17/20  SARS Coronavirus 2 by RT PCR (hospital order, performed in [facility] hospital lab) Nasopharyngeal Nasopharyngeal Swab     Status: None   Collection Time: 05/17/20 11:25 PM   Specimen: Nasopharyngeal Swab  Result Value Ref Range Status   SARS Coronavirus 2 NEGATIVE NEGATIVE Final    Comment: (NOTE) SARS-CoV-2 target nucleic acids are NOT DETECTED.  The SARS-CoV-2 RNA is generally detectable in upper and lower respiratory specimens during the acute phase of infection. The lowest concentration of SARS-CoV-2 viral copies this assay can detect is 250 copies / mL. A negative result does not preclude SARS-CoV-2 infection and should not be used as the sole basis for treatment or other patient management decisions.  A negative result may occur with improper specimen collection / handling, submission of specimen other than nasopharyngeal swab, presence of viral mutation(s) within the areas targeted by this assay, and inadequate number of viral copies (<250 copies / mL). A negative result must be combined with clinical observations, patient history, and epidemiological information.  Fact Sheet for Patients:    StrictlyIdeas.no  Fact Sheet for Healthcare Providers: BankingDealers.co.za  This test is not yet approved or  cleared by the [place] FDA and has been authorized for detection and/or diagnosis of SARS-CoV-2 by FDA under an Emergency Use Authorization (EUA).  This EUA will remain in effect (meaning this test can be used) for the duration of the COVID-19 declaration under Section 564(b)(1) of the Act, 21 U.S.C. section 360bbb-3(b)(1), unless the authorization is terminated or revoked sooner.  Performed at [facility], [address] [place], [place] [address]   MRSA PCR Screening     Status: None   Collection Time: 05/18/20  3:10 AM   Specimen: Nasopharyngeal  Result Value Ref Range Status   MRSA by PCR NEGATIVE NEGATIVE Final    Comment:        The GeneXpert MRSA Assay (FDA approved for NASAL specimens only), is one component of a comprehensive MRSA colonization surveillance program. It is not intended to diagnose MRSA infection nor to guide or monitor treatment for MRSA infections. Performed at [facility], [address] [place], [place] [address]     Coagulation Studies: No results for input(s): LABPROT, INR in the last 72 hours.  Imaging: EEG  Result Date: 05/23/2020 Douglas Goodell, MD     05/23/2020  4:17 PM ELECTROENCEPHALOGRAM REPORT Patient: Douglas Peterson.       Room #: 220A-AA EEG No. ID: [id number] Age: 69 y.o.        Sex: male Requesting Physician: Douglas Peterson Report Date:  05/23/2020       Interpreting Physician: Douglas Peterson History: Douglas Mance. is an 69 y.o. male with altered mental status Medications: ASA, Tenormin, MVI Conditions of Recording:  This is a 21 channel routine scalp EEG performed with bipolar and monopolar montages arranged in accordance to the international 10/20 system of electrode placement. One channel was dedicated to EKG recording. The patient is in the awake  state. Description:  The waking background activity consists of a low voltage, symmetrical, fairly well organized, 9 Hz alpha activity, seen from the parieto-occipital and posterior temporal regions.  Low voltage fast activity, poorly organized, is seen anteriorly and is at times superimposed on more posterior regions.  A mixture of theta and alpha rhythms are seen from the central and temporal regions. The patient does not drowse or sleep. No epileptiform activity is  noted.  Hyperventilation was not performed.  Intermittent photic stimulation was performed but failed to illicit any change in the tracing. IMPRESSION: Normal electroencephalogram, awake and with activation procedures. There are no focal lateralizing or epileptiform features. Douglas Goodell, MD Neurology [phone] 05/23/2020, 4:15 PM   MR BRAIN W WO CONTRAST  Result Date: 05/23/2020 CLINICAL DATA:  Initial evaluation for acute mental status change. History of alcohol abuse. EXAM: MRI HEAD WITHOUT AND WITH CONTRAST TECHNIQUE: Multiplanar, multiecho pulse sequences of the brain and surrounding structures were obtained without and with intravenous contrast. CONTRAST:  25m GADAVIST GADOBUTROL 1 MMOL/ML IV SOLN COMPARISON:  Prior CT from 03/17/2020. FINDINGS: Brain: Generalized age-related cerebral atrophy. Prominent atrophy involving the brainstem and cerebellum noted, suspected be related history of alcohol abuse. Mild chronic microvascular ischemic disease seen involving the periventricular and deep white matter both cerebral hemispheres. No abnormal foci of restricted diffusion to suggest acute or subacute ischemia. Gray-white matter differentiation maintained. No encephalomalacia to suggest chronic cortical infarction. No evidence for acute or chronic intracranial hemorrhage. No mass lesion, midline shift or mass effect. No hydrocephalus or extra-axial fluid collection. Pituitary gland suprasellar region normal. Midline structures intact. No  abnormal enhancement. Vascular: Major intracranial vascular flow voids are maintained. Skull and upper cervical spine: Craniocervical junction within normal limits. Bone marrow signal intensity normal. No scalp soft tissue abnormality. Sinuses/Orbits: Globes and orbital soft tissues demonstrate no acute finding. Paranasal sinuses are largely clear. No significant mastoid effusion. Inner ear structures grossly normal. Other: None. IMPRESSION: 1. No acute intracranial abnormality. 2. Prominent atrophy involving the brainstem and cerebellum, nonspecific, but suspected to be related to history of alcohol abuse. 3. Underlying mild chronic microvascular ischemic disease. Electronically Signed   By: BJeannine BogaM.D.   On: 05/23/2020 23:03    Medications:  I have reviewed the patient's current medications. Scheduled: . aspirin EC  81 mg Oral Daily  . atenolol  25 mg Oral BID  . calcium-vitamin D  1 tablet Oral Q breakfast  . Chlorhexidine Gluconate Cloth  6 each Topical Q0600  . cholecalciferol  5,000 Units Oral Daily  . feeding supplement (ENSURE ENLIVE)  237 mL Oral TID BM  . folic acid  1 mg Oral Daily  . heparin  5,000 Units Subcutaneous Q12H  . lactulose  20 g Oral Daily  . multivitamin with minerals  1 tablet Oral Daily  . pantoprazole  40 mg Oral Daily  . PARoxetine  30 mg Oral Daily  . sodium chloride flush  3 mL Intravenous Q12H  . thiamine  100 mg Oral Daily    Assessment/Plan: 69year old male with a history of ETOH abuse presenting intoxicated.  There is concern for his ability to care for himself.  Patient now no longer appears intoxicated.  Based on patient's level of education, he clearly is showing some signs of cognitive impairment (unable to give name of President and make change).  Full cognitive evaluation difficult to perform during an inpatient setting.  Although this is likely secondary to his alcohol abuse, will rule out other possible etiologies as well.  MRI of the  brain personally reviewed and only significant for atrophy.  EEG unremarkable.  B12 elevated.  Folate, ESR are normal.  Serum ammonia only mildly elevated.  B1 pending.    No further neurologic intervention is recommended at this time.  If further questions arise, please call or page at that time.  Thank you for allowing neurology to participate in the care of this  patient.   LOS: 7 days   Douglas Goodell, MD Neurology [phone] 05/24/2020  1:32 PM

## 2020-05-24 NOTE — Care Management Important Message (Signed)
Important Message  Patient Details  Name: Douglas Peterson. MRN: [id number] Date of Birth: June 22, 1951   Medicare Important Message Given:  Yes  Reviewed with daughter, [name]. Copy of Medicare IM left in patient's room earlier this week.  Declined receiving email copy.     [name] 05/24/2020, 1:27 PM

## 2020-05-25 DIAGNOSIS — D6869 Other thrombophilia: Secondary | ICD-10-CM

## 2020-05-25 LAB — CBC
HCT: 40.6 % (ref 39.0–52.0)
Hemoglobin: 14 g/dL (ref 13.0–17.0)
MCH: 31.7 pg (ref 26.0–34.0)
MCHC: 34.5 g/dL (ref 30.0–36.0)
MCV: 92.1 fL (ref 80.0–100.0)
Platelets: 188 10*3/uL (ref 150–400)
RBC: 4.41 MIL/uL (ref 4.22–5.81)
RDW: 13.8 % (ref 11.5–15.5)
WBC: 5.4 10*3/uL (ref 4.0–10.5)
nRBC: 0 % (ref 0.0–0.2)

## 2020-05-25 NOTE — TOC Progression Note (Addendum)
Transition of Care (TOC) - Progression Note    Patient Details  Name: Douglas Peterson. MRN: [id number] Date of Birth: 1951/05/03  Transition of Care [facility]) CM/SW Contact  Beverly Sessions, RN Phone Number: 05/25/2020, 10:33 AM  Clinical Narrative:    Insurance auth still pending.   Updated therapy notes sent in HUB to [facility]  Update:  Received call from daughter [name].  She states that she has not received call from [facility].  She states she is no longer interested pursing gaurdianship. She asks that I give this information to dss worker Roni.  RNCM spoke with Roni from [facility].  Provided her with the above information.  She states she just got off the phone with daughter.  Updated her that insurance Josem Kaufmann is still pending.  She request that if case is denied by [employer] over the weekend to please call [facility] office and ask to speak to the on call SW and provide them with this info   Expected Discharge Plan: [place] (However this may change based upon PT consults) Barriers to Discharge: Barriers Unresolved (comment)  Expected Discharge Plan and Services Expected Discharge Plan: [place] (However this may change based upon PT consults) In-house Referral: Clinical Social Work Discharge Planning Services: CM Consult   Living arrangements for the past 2 months: Single Family Home                           HH Arranged: RN [facility] Agency: [place] Date [place]: 05/19/20   Representative spoke with at [employer]: [place] (SDOH) Interventions    Readmission Risk Interventions Readmission Risk Prevention Plan 05/19/2020 12/13/2019 12/12/2019  Transportation Screening Complete Complete Complete  PCP or Specialist Appt within 3-5 Days - (No Data) -  HRI or [place] - (No Data) -  Palliative Care Screening - Complete Complete  Medication Review (RN Care Manager) Complete  Complete Complete  PCP or Specialist appointment within 3-5 days of discharge Complete - -  [place] or Home Care Consult Complete - -  SW Recovery Care/Counseling Consult Complete - -  Palliative Care Screening Not Applicable - -  [place] Not Applicable - -  Some recent data might be hidden

## 2020-05-25 NOTE — Progress Notes (Signed)
Patient ID: Douglas Pesa., male   DOB: 1950-11-16, 69 y.o.   MRN: [id number] [facility] PROGRESS NOTE  Douglas Pesa. SNK:[id number] DOB: January 19, 1951 DOA: 05/17/2020 PCP: [name], MD  HPI/Subjective: Patient admitted on 05/17/2020 for acute metabolic encephalopathy and alcohol intoxication.  Patient physically feels okay at this point.  The patient has had multiple hospitalizations for alcohol abuse and altered mental status.  Objective: Vitals:   05/24/20 2008 05/25/20 1145  BP: (!) 137/71 120/84  Pulse: 79 70  Resp: 20 14  Temp: 98 F (36.7 C) 98.1 F (36.7 C)  SpO2: 97% 97%    Intake/Output Summary (Last 24 hours) at 05/25/2020 1556 Last data filed at 05/25/2020 0930 Gross per 24 hour  Intake 480 ml  Output 125 ml  Net 355 ml   Filed Weights   05/17/20 1412 05/18/20 0313 05/24/20 1109  Weight: 56.2 kg 53.1 kg 54.2 kg    ROS: Review of Systems  Respiratory: Negative for shortness of breath.   Cardiovascular: Negative for chest pain.  Gastrointestinal: Negative for abdominal pain.   Exam: Physical Exam HENT:     Nose: No mucosal edema.     Mouth/Throat:     Pharynx: No oropharyngeal exudate.  Eyes:     General: Lids are normal.     Conjunctiva/sclera: Conjunctivae normal.     Pupils: Pupils are equal, round, and reactive to light.  Cardiovascular:     Rate and Rhythm: Normal rate and regular rhythm.     Heart sounds: Normal heart sounds, S1 normal and S2 normal.  Pulmonary:     Breath sounds: No decreased breath sounds, wheezing, rhonchi or rales.  Abdominal:     Palpations: Abdomen is soft.     Tenderness: There is no abdominal tenderness.  Musculoskeletal:     Right ankle: No swelling.     Left ankle: No swelling.  Skin:    General: Skin is warm.     Findings: No rash.  Neurological:     Mental Status: He is alert.     Comments: Able to straight leg raise. Right arm contracture only able to flex at the shoulder very slightly and at the  elbow very slightly.  Fingers contracted and unable to extend.        Recent Labs  Lab 05/24/20 0550  AMMONIA 39*   CBC: Recent Labs  Lab 05/25/20 0514  WBC 5.4  HGB 14.0  HCT 40.6  MCV 92.1  PLT 188   Cardiac Enzymes: Recent Labs  Lab 05/19/20 0409  CKTOTAL 372   BNP (last 3 results) Recent Labs    01/18/20 1056  BNP 185.0*     Recent Results (from the past 240 hour(s))  SARS Coronavirus 2 by RT PCR (hospital order, performed in [facility] hospital lab) Nasopharyngeal Nasopharyngeal Swab     Status: None   Collection Time: 05/17/20 11:25 PM   Specimen: Nasopharyngeal Swab  Result Value Ref Range Status   SARS Coronavirus 2 NEGATIVE NEGATIVE Final    Comment: (NOTE) SARS-CoV-2 target nucleic acids are NOT DETECTED.  The SARS-CoV-2 RNA is generally detectable in upper and lower respiratory specimens during the acute phase of infection. The lowest concentration of SARS-CoV-2 viral copies this assay can detect is 250 copies / mL. A negative result does not preclude SARS-CoV-2 infection and should not be used as the sole basis for treatment or other patient management decisions.  A negative result may occur with improper specimen collection / handling, submission  of specimen other than nasopharyngeal swab, presence of viral mutation(s) within the areas targeted by this assay, and inadequate number of viral copies (<250 copies / mL). A negative result must be combined with clinical observations, patient history, and epidemiological information.  Fact Sheet for Patients:   StrictlyIdeas.no  Fact Sheet for Healthcare Providers: BankingDealers.co.za  This test is not yet approved or  cleared by the [place] FDA and has been authorized for detection and/or diagnosis of SARS-CoV-2 by FDA under an Emergency Use Authorization (EUA).  This EUA will remain in effect (meaning this test can be used) for the duration  of the COVID-19 declaration under Section 564(b)(1) of the Act, 21 U.S.C. section 360bbb-3(b)(1), unless the authorization is terminated or revoked sooner.  Performed at [facility], [address] [place], [place] [address]   MRSA PCR Screening     Status: None   Collection Time: 05/18/20  3:10 AM   Specimen: Nasopharyngeal  Result Value Ref Range Status   MRSA by PCR NEGATIVE NEGATIVE Final    Comment:        The GeneXpert MRSA Assay (FDA approved for NASAL specimens only), is one component of a comprehensive MRSA colonization surveillance program. It is not intended to diagnose MRSA infection nor to guide or monitor treatment for MRSA infections. Performed at [facility], [address] [place], [place] [address]      Studies: EEG  Result Date: 05/23/2020 Alexis Goodell, MD     05/23/2020  4:17 PM ELECTROENCEPHALOGRAM REPORT Patient: Douglas Peterson.       Room #: 220A-AA EEG No. ID: [id number] Age: 69 y.o.        Sex: male Requesting Physician: Leslye Peer Report Date:  05/23/2020       Interpreting Physician: Alexis Goodell History: Douglas Grape. is an 69 y.o. male with altered mental status Medications: ASA, Tenormin, MVI Conditions of Recording:  This is a 21 channel routine scalp EEG performed with bipolar and monopolar montages arranged in accordance to the international 10/20 system of electrode placement. One channel was dedicated to EKG recording. The patient is in the awake state. Description:  The waking background activity consists of a low voltage, symmetrical, fairly well organized, 9 Hz alpha activity, seen from the parieto-occipital and posterior temporal regions.  Low voltage fast activity, poorly organized, is seen anteriorly and is at times superimposed on more posterior regions.  A mixture of theta and alpha rhythms are seen from the central and temporal regions. The patient does not drowse or sleep. No epileptiform activity is noted.   Hyperventilation was not performed.  Intermittent photic stimulation was performed but failed to illicit any change in the tracing. IMPRESSION: Normal electroencephalogram, awake and with activation procedures. There are no focal lateralizing or epileptiform features. Alexis Goodell, MD Neurology [phone] 05/23/2020, 4:15 PM   MR BRAIN W WO CONTRAST  Result Date: 05/23/2020 CLINICAL DATA:  Initial evaluation for acute mental status change. History of alcohol abuse. EXAM: MRI HEAD WITHOUT AND WITH CONTRAST TECHNIQUE: Multiplanar, multiecho pulse sequences of the brain and surrounding structures were obtained without and with intravenous contrast. CONTRAST:  33m GADAVIST GADOBUTROL 1 MMOL/ML IV SOLN COMPARISON:  Prior CT from 03/17/2020. FINDINGS: Brain: Generalized age-related cerebral atrophy. Prominent atrophy involving the brainstem and cerebellum noted, suspected be related history of alcohol abuse. Mild chronic microvascular ischemic disease seen involving the periventricular and deep white matter both cerebral hemispheres. No abnormal foci of restricted diffusion to suggest acute or subacute  ischemia. Gray-white matter differentiation maintained. No encephalomalacia to suggest chronic cortical infarction. No evidence for acute or chronic intracranial hemorrhage. No mass lesion, midline shift or mass effect. No hydrocephalus or extra-axial fluid collection. Pituitary gland suprasellar region normal. Midline structures intact. No abnormal enhancement. Vascular: Major intracranial vascular flow voids are maintained. Skull and upper cervical spine: Craniocervical junction within normal limits. Bone marrow signal intensity normal. No scalp soft tissue abnormality. Sinuses/Orbits: Globes and orbital soft tissues demonstrate no acute finding. Paranasal sinuses are largely clear. No significant mastoid effusion. Inner ear structures grossly normal. Other: None. IMPRESSION: 1. No acute intracranial abnormality.  2. Prominent atrophy involving the brainstem and cerebellum, nonspecific, but suspected to be related to history of alcohol abuse. 3. Underlying mild chronic microvascular ischemic disease. Electronically Signed   By: Jeannine Boga M.D.   On: 05/23/2020 23:03    Scheduled Meds: . aspirin EC  81 mg Oral Daily  . atenolol  25 mg Oral BID  . calcium-vitamin D  1 tablet Oral Q breakfast  . Chlorhexidine Gluconate Cloth  6 each Topical Q0600  . cholecalciferol  5,000 Units Oral Daily  . feeding supplement (ENSURE ENLIVE)  237 mL Oral TID BM  . folic acid  1 mg Oral Daily  . heparin  5,000 Units Subcutaneous Q12H  . lactulose  20 g Oral Daily  . multivitamin with minerals  1 tablet Oral Daily  . pantoprazole  40 mg Oral Daily  . PARoxetine  30 mg Oral Daily  . sodium chloride flush  3 mL Intravenous Q12H  . thiamine  100 mg Oral Daily   Continuous Infusions: . sodium chloride      Assessment/Plan:  1. Alcohol abuse with acute metabolic encephalopathy.  MRI of the brain showing atrophy and chronic small vessel disease.  B12 level normal.  Ammonia level slightly elevated.  I did put on once a day lactulose.  Vitamin B 1 pending. 2. Psychiatry team deemed the patient incompetent to make medical decisions at this time.  Unfortunately insurance company declined rehab and placement option.  Awaiting callback from peer review. 3. Acute rhabdomyolysis improved with IV fluids 4. Essential hypertension on atenolol 5. Paroxysmal atrial fibrillation on atenolol.  Not on any anticoagulation secondary to fall risk. 6. Acquired thrombophilia secondary to paroxysmal atrial fibrillation.  Higher risk of clot formation and thrombus.   7. Elevated liver enzymes secondary to alcohol abuse 8. Severe protein calorie malnutrition 9. 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 Buttocks decubiti, stage II, partial-thickness loss of the dermis     Code Status:     Code Status  Orders  (From admission, onward)         Start     Ordered   05/17/20 2340  Full code  Continuous        05/17/20 2342        Code Status History    Date Active Date Inactive Code Status Order ID Comments User Context   04/06/2020 2024 04/19/2020 2204 Full Code [id number]  Ivor Costa, MD Inpatient   03/17/2020 1930 03/26/2020 1933 Full Code [id number]  Edwin Dada, MD ED   02/23/2020 2202 03/01/2020 1914 Full Code [id number]  Mansy, Arvella Merles, MD ED   01/17/2020 1512 01/30/2020 1833 Full Code [id number]  Ivor Costa, MD Inpatient   12/09/2019 1237 12/13/2019 1557 Full Code [id number]  Toy Baker, MD ED   12/07/2019 0618 12/09/2019 1237 Full Code [id number]  Gregor Hams, MD ED  09/20/2019 2314 10/03/2019 1741 Full Code [id number]  [name], MD ED   08/14/2019 1932 08/25/2019 2145 Full Code [id number]  Loletha Grayer, MD ED   08/14/2019 1840 08/14/2019 1932 Full Code [id number]  Blake Divine, MD ED   06/12/2019 2308 06/21/2019 2018 Full Code [id number]  Dustin Flock, MD Inpatient   03/29/2019 1337 04/01/2019 1939 DNR [id number]  Philis Pique, NP Inpatient   03/20/2019 1510 03/29/2019 1337 Full Code [id number]  Mayo, Pete Pelt, MD Inpatient   03/20/2019 0507 03/20/2019 1509 Full Code [id number]  Gregor Hams, MD ED   11/16/2018 0101 11/23/2018 1455 Full Code [id number]  Gladstone Lighter, MD ED   10/12/2018 1355 10/13/2018 1839 Full Code [id number]  Arta Silence, MD Inpatient   10/12/2018 0039 10/12/2018 1354 Full Code [id number]  Orbie Pyo, MD ED   05/16/2018 1009 05/16/2018 1611 Full Code [id number]  Schuyler Amor, MD ED   03/12/2018 1605 03/16/2018 2212 DNR [id number]  Jimmy Footman, NP Inpatient   03/08/2018 0950 03/12/2018 1605 Full Code [id number]  Harrie Foreman, MD Inpatient   03/07/2018 1607 03/08/2018 0950 Full Code [id number]  [name], MD ED   11/23/2017 1502 11/26/2017 1642 Full Code [id number]  Vaughan Basta, MD Inpatient    09/22/2017 1202 09/22/2017 2017 Full Code [id number]  Nena Polio, MD ED   09/16/2017 2344 09/18/2017 1550 Full Code [id number]  Lance Coon, MD Inpatient   09/16/2017 2037 09/16/2017 2343 Full Code [id number]  Orbie Pyo, MD ED   12/22/2016 1126 12/25/2016 1910 DNR [id number]  [name], MD Inpatient   12/17/2016 1147 12/22/2016 1126 Full Code [id number]  Henreitta Leber, MD Inpatient   09/12/2015 2108 09/15/2015 1501 Full Code [id number]  Henreitta Leber, MD Inpatient   07/09/2015 1559 07/11/2015 2110 Full Code [id number]  Bettey Costa, MD Inpatient   Advance Care Planning Activity     Family Communication: Awaiting peer review before I speak with the patient's daughter Disposition Plan: Status is: Inpatient  Dispo: The patient is from: Home              Anticipated d/c is to: Unclear at this point              Anticipated d/c date is: Insurance company declined authorization for rehab.  Awaiting peer review on authorization to rehab.              Patient currently in the hospital because he is an unsafe discharge home.  He is deemed by the psychiatry team as incompetent to make good decisions for his health.  The patient comes from home (alone) and does not have family support to take care of him 24/7.  The patient continues to drink alcohol despite numerous hospitalizations.  Consultants:  Neurology  Psychiatry  Time spent: 26 minutes.  [place]  [facility] [employer]

## 2020-05-25 NOTE — Progress Notes (Signed)
Patient ID: Douglas Peterson., male   DOB: 08-12-1951, 69 y.o.   MRN: [id number]  Peer-to-peer was rejected for rehab or long-term care. Tried to reach the patient's daughter but left a message Message the care manager but likely gone for the day.  Dr Loletha Grayer

## 2020-05-25 NOTE — TOC Progression Note (Signed)
Transition of Care (TOC) - Progression Note    Patient Details  Name: Douglas Peterson. MRN: [id number] Date of Birth: 23-Dec-1950  Transition of Care [facility]) CM/SW Contact  Beverly Sessions, RN Phone Number: 05/25/2020, 4:05 PM  Clinical Narrative:     Received notification from Tammy at [facility] that [employer] has denied auth. And offering peer to peer  RNCM notified MD: "Blair Hailey has denied. Peer to Peer available and would have to be completed by Monday at [facility] number [phone], option 3. and the doctor is Dr Jerene Pitch"  MD actively attempting to complete peer to peer at this time  RNCM updated [name]  [phone]   Expected Discharge Plan: [place] (However this may change based upon PT consults) Barriers to Discharge: Barriers Unresolved (comment)  Expected Discharge Plan and Services Expected Discharge Plan: [place] (However this may change based upon PT consults) In-house Referral: Clinical Social Work Discharge Planning Services: CM Consult   Living arrangements for the past 2 months: Single Family Home                           HH Arranged: RN [facility] Agency: [place] Date [place]: 05/19/20   Representative spoke with at [place]: [place] (SDOH) Interventions    Readmission Risk Interventions Readmission Risk Prevention Plan 05/19/2020 12/13/2019 12/12/2019  Transportation Screening Complete Complete Complete  PCP or Specialist Appt within 3-5 Days - (No Data) -  HRI or [place] - (No Data) -  Palliative Care Screening - Complete Complete  Medication Review (RN Care Manager) Complete Complete Complete  PCP or Specialist appointment within 3-5 days of discharge Complete - -  [place] or Home Care Consult Complete - -  SW Recovery Care/Counseling Consult Complete - -  Palliative Care Screening Not Applicable - -  [place] Not Applicable - -   Some recent data might be hidden

## 2020-05-26 LAB — MAGNESIUM: Magnesium: 1.9 mg/dL (ref 1.7–2.4)

## 2020-05-26 LAB — COMPREHENSIVE METABOLIC PANEL
ALT: 31 U/L (ref 0–44)
AST: 25 U/L (ref 15–41)
Albumin: 3.8 g/dL (ref 3.5–5.0)
Alkaline Phosphatase: 82 U/L (ref 38–126)
Anion gap: 7 (ref 5–15)
BUN: 17 mg/dL (ref 8–23)
CO2: 29 mmol/L (ref 22–32)
Calcium: 9.1 mg/dL (ref 8.9–10.3)
Chloride: 101 mmol/L (ref 98–111)
Creatinine, Ser: 0.56 mg/dL — ABNORMAL LOW (ref 0.61–1.24)
GFR calc Af Amer: >60 mL/min (ref >60)
GFR calc non Af Amer: >60 mL/min (ref >60)
Glucose, Bld: 99 mg/dL (ref 70–99)
Potassium: 4.1 mmol/L (ref 3.5–5.1)
Sodium: 137 mmol/L (ref 135–145)
Total Bilirubin: 0.6 mg/dL (ref 0.3–1.2)
Total Protein: 7 g/dL (ref 6.5–8.1)

## 2020-05-26 LAB — PHOSPHORUS: Phosphorus: 4.5 mg/dL (ref 2.5–4.6)

## 2020-05-26 NOTE — TOC Progression Note (Signed)
Transition of Care (TOC) - Progression Note    Patient Details  Name: Douglas Peterson. MRN: [id number] Date of Birth: [birth year]  Transition of Care [facility]) CM/SW [place],  Phone Number: 05/26/2020, 3:27 PM  Clinical Narrative:     CSW spoke with patient's daughter regarding inpatient alcohol rehab for the patient. Patient's daughter agreed. CSW reached out to [facility] in [place], [place] and [facility] in [place]. CSW sent off referrals to [facility] and [facility]. Awaiting call backs/disposition.   CSW contacted [facility]. They will take patient's insurance but only for detox and not for inpatient unless patient is willing to pay out of pocket.  CSW attempted to contact [facility] in  who also takes [employer]. They are not open on the weekends.   CSW will continue to look for inpatient alcohol treatment centers that will take the patient's diagnosis and insurance. CSW updated patient's doctor.   Expected Discharge Plan: [place] (However this may change based upon PT consults) Barriers to Discharge: Barriers Unresolved (comment)  Expected Discharge Plan and Services Expected Discharge Plan: [place] (However this may change based upon PT consults) In-house Referral: Clinical Social Work Discharge Planning Services: CM Consult   Living arrangements for the past 2 months: Single Family Home                           HH Arranged: RN [facility] Agency: [place] Date [place]: 05/19/20   Representative spoke with at [place]: [place] ([place]) Interventions    Readmission Risk Interventions Readmission Risk Prevention Plan 05/19/2020 12/13/2019 12/12/2019  Transportation Screening Complete Complete Complete  PCP or Specialist Appt within 3-5 Days - (No Data) -  HRI or [place] -  (No Data) -  Palliative Care Screening - Complete Complete  Medication Review (RN Care Manager) Complete Complete Complete  PCP or Specialist appointment within 3-5 days of discharge Complete - -  [place] or Home Care Consult Complete - -  SW Recovery Care/Counseling Consult Complete - -  Palliative Care Screening Not Applicable - -  [place] Not Applicable - -  Some recent data might be hidden

## 2020-05-26 NOTE — Progress Notes (Signed)
Patient ID: Douglas Pesa., male   DOB: [birth year], 69 y.o.   MRN: [id number] [facility] PROGRESS NOTE  Douglas Pesa. JJK:[id number] DOB: May 19, 1951 DOA: 05/17/2020 PCP: [name], MD  HPI/Subjective: Patient came in with alcohol withdrawal and acute metabolic encephalopathy.  Patient feels physically okay.  He is interested in stopping drinking.  Interested in alcohol rehab.  Objective: Vitals:   05/26/20 0539 05/26/20 1316  BP: 110/69 122/79  Pulse: 58 64  Resp: 18 18  Temp: 97.6 F (36.4 C) 97.9 F (36.6 C)  SpO2: 98% 98%    Intake/Output Summary (Last 24 hours) at 05/26/2020 1346 Last data filed at 05/26/2020 3716 Gross per 24 hour  Intake 118 ml  Output 475 ml  Net -357 ml   Filed Weights   05/17/20 1412 05/18/20 0313 05/24/20 1109  Weight: 56.2 kg 53.1 kg 54.2 kg    ROS: Review of Systems  Respiratory: Negative for shortness of breath.   Cardiovascular: Negative for chest pain.  Gastrointestinal: Negative for abdominal pain.   Exam: Physical Exam HENT:     Nose: No mucosal edema.     Mouth/Throat:     Pharynx: No oropharyngeal exudate.  Eyes:     General: Lids are normal.     Conjunctiva/sclera: Conjunctivae normal.     Pupils: Pupils are equal, round, and reactive to light.  Cardiovascular:     Rate and Rhythm: Normal rate and regular rhythm.     Heart sounds: Normal heart sounds, S1 normal and S2 normal.  Pulmonary:     Breath sounds: No decreased breath sounds, wheezing, rhonchi or rales.  Abdominal:     Palpations: Abdomen is soft.     Tenderness: There is no abdominal tenderness.  Musculoskeletal:     Right ankle: No swelling.     Left ankle: No swelling.  Skin:    General: Skin is warm.     Findings: No rash.  Neurological:     Mental Status: He is alert.     Comments: Right arm with contraction.       Data Reviewed: Basic Metabolic Panel: Recent Labs  Lab 05/26/20 0310  NA 137  K 4.1  CL 101  CO2 29  GLUCOSE 99   BUN 17  CREATININE 0.56*  CALCIUM 9.1  MG 1.9  PHOS 4.5   Liver Function Tests: Recent Labs  Lab 05/26/20 0310  AST 25  ALT 31  ALKPHOS 82  BILITOT 0.6  PROT 7.0  ALBUMIN 3.8   No results for input(s): LIPASE, AMYLASE in the last 168 hours. Recent Labs  Lab 05/24/20 0550  AMMONIA 39*   CBC: Recent Labs  Lab 05/25/20 0514  WBC 5.4  HGB 14.0  HCT 40.6  MCV 92.1  PLT 188   Cardiac Enzymes: No results for input(s): CKTOTAL, CKMB, CKMBINDEX, TROPONINI in the last 168 hours. BNP (last 3 results) Recent Labs    01/18/20 1056  BNP 185.0*    ProBNP (last 3 results) No results for input(s): PROBNP in the last 8760 hours.  CBG: No results for input(s): GLUCAP in the last 168 hours.  Recent Results (from the past 240 hour(s))  SARS Coronavirus 2 by RT PCR (hospital order, performed in [facility] hospital lab) Nasopharyngeal Nasopharyngeal Swab     Status: None   Collection Time: 05/17/20 11:25 PM   Specimen: Nasopharyngeal Swab  Result Value Ref Range Status   SARS Coronavirus 2 NEGATIVE NEGATIVE Final    Comment: (NOTE) SARS-CoV-2 target  nucleic acids are NOT DETECTED.  The SARS-CoV-2 RNA is generally detectable in upper and lower respiratory specimens during the acute phase of infection. The lowest concentration of SARS-CoV-2 viral copies this assay can detect is 250 copies / mL. A negative result does not preclude SARS-CoV-2 infection and should not be used as the sole basis for treatment or other patient management decisions.  A negative result may occur with improper specimen collection / handling, submission of specimen other than nasopharyngeal swab, presence of viral mutation(s) within the areas targeted by this assay, and inadequate number of viral copies (<250 copies / mL). A negative result must be combined with clinical observations, patient history, and epidemiological information.  Fact Sheet for Patients:    StrictlyIdeas.no  Fact Sheet for Healthcare Providers: BankingDealers.co.za  This test is not yet approved or  cleared by the [place] FDA and has been authorized for detection and/or diagnosis of SARS-CoV-2 by FDA under an Emergency Use Authorization (EUA).  This EUA will remain in effect (meaning this test can be used) for the duration of the COVID-19 declaration under Section 564(b)(1) of the Act, 21 U.S.C. section 360bbb-3(b)(1), unless the authorization is terminated or revoked sooner.  Performed at [facility], [address] [place], [place] [address]   MRSA PCR Screening     Status: None   Collection Time: 05/18/20  3:10 AM   Specimen: Nasopharyngeal  Result Value Ref Range Status   MRSA by PCR NEGATIVE NEGATIVE Final    Comment:        The GeneXpert MRSA Assay (FDA approved for NASAL specimens only), is one component of a comprehensive MRSA colonization surveillance program. It is not intended to diagnose MRSA infection nor to guide or monitor treatment for MRSA infections. Performed at [facility], [address] [place], [place] [address]      Studies: No results found.  Scheduled Meds: . aspirin EC  81 mg Oral Daily  . atenolol  25 mg Oral BID  . calcium-vitamin D  1 tablet Oral Q breakfast  . cholecalciferol  5,000 Units Oral Daily  . feeding supplement (ENSURE ENLIVE)  237 mL Oral TID BM  . folic acid  1 mg Oral Daily  . heparin  5,000 Units Subcutaneous Q12H  . lactulose  20 g Oral Daily  . multivitamin with minerals  1 tablet Oral Daily  . pantoprazole  40 mg Oral Daily  . PARoxetine  30 mg Oral Daily  . sodium chloride flush  3 mL Intravenous Q12H  . thiamine  100 mg Oral Daily   Continuous Infusions: . sodium chloride      Assessment/Plan:  1. Alcohol abuse with acute metabolic encephalopathy.  Mental status improved from when he came in.  Still with poor  insight. 2. Psychiatry team deemed him incompetent to make medical decisions at this time.  Insurance company declined rehab and long-term placement.  Insurance company stated they would pay for alcohol rehab.  Transitional care team looking into alcohol rehab possibilities. 3. Acute rhabdomyolysis improved with IV fluids 4. Essential hypertension on atenolol 5. Paroxysmal atrial fibrillation on atenolol.  Not on any anticoagulation secondary to fall risk. 6. Acquired thrombophilia.  Higher risk of clot formation and thrombus with paroxysmal atrial fibrillation. 7. Elevated liver enzymes secondary to alcohol abuse 8. Severe protein calorie malnutrition 9. 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 (Active)  04/30/20 2222  Location: Buttocks  Location Orientation: Right;Left  Staging: Stage 2 -  Partial thickness loss of dermis presenting as a shallow open injury with a red, pink wound bed without slough.  Wound Description (Comments):   Present on Admission:        Code Status:     Code Status Orders  (From admission, onward)         Start     Ordered   05/17/20 2340  Full code  Continuous        05/17/20 2342        Code Status History    Date Active Date Inactive Code Status Order ID Comments User Context   04/06/2020 2024 04/19/2020 2204 Full Code [id number]  Ivor Costa, MD Inpatient   03/17/2020 1930 03/26/2020 1933 Full Code [id number]  Edwin Dada, MD ED   02/23/2020 2202 03/01/2020 1914 Full Code [id number]  Mansy, Arvella Merles, MD ED   01/17/2020 1512 01/30/2020 1833 Full Code [id number]  Ivor Costa, MD Inpatient   12/09/2019 1237 12/13/2019 1557 Full Code [id number]  Toy Baker, MD ED   12/07/2019 0618 12/09/2019 1237 Full Code [id number]  Gregor Hams, MD ED   09/20/2019 2314  10/03/2019 1741 Full Code [id number]  [name], MD ED   08/14/2019 1932 08/25/2019 2145 Full Code [id number]  Loletha Grayer, MD ED   08/14/2019 1840 08/14/2019 1932 Full Code [id number]  Blake Divine, MD ED   06/12/2019 2308 06/21/2019 2018 Full Code [id number]  Dustin Flock, MD Inpatient   03/29/2019 1337 04/01/2019 1939 DNR [id number]  Philis Pique, NP Inpatient   03/20/2019 1510 03/29/2019 1337 Full Code [id number]  Mayo, Pete Pelt, MD Inpatient   03/20/2019 0507 03/20/2019 1509 Full Code [id number]  Gregor Hams, MD ED   11/16/2018 0101 11/23/2018 1455 Full Code [id number]  Gladstone Lighter, MD ED   10/12/2018 1355 10/13/2018 1839 Full Code [id number]  Arta Silence, MD Inpatient   10/12/2018 0039 10/12/2018 1354 Full Code [id number]  Orbie Pyo, MD ED   05/16/2018 1009 05/16/2018 1611 Full Code [id number]  Schuyler Amor, MD ED   03/12/2018 1605 03/16/2018 2212 DNR [id number]  Jimmy Footman, NP Inpatient   03/08/2018 0950 03/12/2018 1605 Full Code [id number]  Harrie Foreman, MD Inpatient   03/07/2018 1607 03/08/2018 0950 Full Code [id number]  [name], MD ED   11/23/2017 1502 11/26/2017 1642 Full Code [id number]  Vaughan Basta, MD Inpatient   09/22/2017 1202 09/22/2017 2017 Full Code [id number]  Nena Polio, MD ED   09/16/2017 2344 09/18/2017 1550 Full Code [id number]  Lance Coon, MD Inpatient   09/16/2017 2037 09/16/2017 2343 Full Code [id number]  Orbie Pyo, MD ED   12/22/2016 1126 12/25/2016 1910 DNR [id number]  [name], MD Inpatient   12/17/2016 1147 12/22/2016 1126 Full Code [id number]  Henreitta Leber, MD Inpatient   09/12/2015 2108 09/15/2015 1501 Full Code [id number]  Henreitta Leber, MD Inpatient   07/09/2015 1559 07/11/2015 2110 Full Code [id number]  Bettey Costa, MD Inpatient   Advance Care Planning Activity     Family Communication: Spoke with daughter on the phone Disposition Plan: Status is: Inpatient  Dispo:  The patient is from: Home              Anticipated d/c is to: Acute alcohol inpatient rehab  Anticipated d/c date is: To be determined based on bed availability.  Transitional care team looking into options.              Patient currently being followed here in the hospital for unsafe disposition home at this point.  Patient deemed medically incompetent by psychiatry.  Unsafe disposition.  Time spent: 27 minutes  [place]

## 2020-05-27 NOTE — Progress Notes (Signed)
Patient ID: Douglas Pesa., male   DOB: 08-20-1951, 69 y.o.   MRN: [id number] [facility] PROGRESS NOTE  Douglas Pesa. JAS:[id number] DOB: May 26, 1951 DOA: 05/17/2020 PCP: [name], MD  HPI/Subjective: The patient is interested in alcohol rehab programs.  He states that he needs to go home first to take care of a few things.  Offers no complaints.  No chest pain or shortness of breath.  Objective: Vitals:   05/27/20 0530 05/27/20 1157  BP: 114/73 (!) 142/83  Pulse: 55 64  Resp: 20   Temp: 97.6 F (36.4 C) 98.1 F (36.7 C)  SpO2: 97% 95%    Intake/Output Summary (Last 24 hours) at 05/27/2020 1318 Last data filed at 05/27/2020 0645 Gross per 24 hour  Intake --  Output 950 ml  Net -950 ml   Filed Weights   05/17/20 1412 05/18/20 0313 05/24/20 1109  Weight: 56.2 kg 53.1 kg 54.2 kg    ROS: Review of Systems  Respiratory: Negative for shortness of breath.   Cardiovascular: Negative for chest pain.  Gastrointestinal: Negative for abdominal pain.   Exam: Physical Exam HENT:     Mouth/Throat:     Pharynx: No oropharyngeal exudate.  Eyes:     General: Lids are normal.     Conjunctiva/sclera: Conjunctivae normal.     Pupils: Pupils are equal, round, and reactive to light.  Cardiovascular:     Rate and Rhythm: Normal rate and regular rhythm.     Heart sounds: Normal heart sounds, S1 normal and S2 normal.  Pulmonary:     Breath sounds: No decreased breath sounds, wheezing, rhonchi or rales.  Abdominal:     Palpations: Abdomen is soft.     Tenderness: There is no abdominal tenderness.  Musculoskeletal:     Right ankle: No swelling.     Left ankle: No swelling.  Skin:    General: Skin is warm.     Findings: No rash.  Neurological:     Mental Status: He is alert.     Comments: Able to straight leg raise without a problem Right arm contracted       Data Reviewed: Basic Metabolic Panel: Recent Labs  Lab 05/26/20 0310  NA 137  K 4.1  CL 101  CO2  29  GLUCOSE 99  BUN 17  CREATININE 0.56*  CALCIUM 9.1  MG 1.9  PHOS 4.5   Liver Function Tests: Recent Labs  Lab 05/26/20 0310  AST 25  ALT 31  ALKPHOS 82  BILITOT 0.6  PROT 7.0  ALBUMIN 3.8    Recent Labs  Lab 05/24/20 0550  AMMONIA 39*   CBC: Recent Labs  Lab 05/25/20 0514  WBC 5.4  HGB 14.0  HCT 40.6  MCV 92.1  PLT 188   BNP (last 3 results) Recent Labs    01/18/20 1056  BNP 185.0*      Recent Results (from the past 240 hour(s))  SARS Coronavirus 2 by RT PCR (hospital order, performed in [facility] hospital lab) Nasopharyngeal Nasopharyngeal Swab     Status: None   Collection Time: 05/17/20 11:25 PM   Specimen: Nasopharyngeal Swab  Result Value Ref Range Status   SARS Coronavirus 2 NEGATIVE NEGATIVE Final    Comment: (NOTE) SARS-CoV-2 target nucleic acids are NOT DETECTED.  The SARS-CoV-2 RNA is generally detectable in upper and lower respiratory specimens during the acute phase of infection. The lowest concentration of SARS-CoV-2 viral copies this assay can detect is 250 copies / mL. A  negative result does not preclude SARS-CoV-2 infection and should not be used as the sole basis for treatment or other patient management decisions.  A negative result may occur with improper specimen collection / handling, submission of specimen other than nasopharyngeal swab, presence of viral mutation(s) within the areas targeted by this assay, and inadequate number of viral copies (<250 copies / mL). A negative result must be combined with clinical observations, patient history, and epidemiological information.  Fact Sheet for Patients:   StrictlyIdeas.no  Fact Sheet for Healthcare Providers: BankingDealers.co.za  This test is not yet approved or  cleared by the [place] FDA and has been authorized for detection and/or diagnosis of SARS-CoV-2 by FDA under an Emergency Use Authorization (EUA).  This EUA  will remain in effect (meaning this test can be used) for the duration of the COVID-19 declaration under Section 564(b)(1) of the Act, 21 U.S.C. section 360bbb-3(b)(1), unless the authorization is terminated or revoked sooner.  Performed at [facility], [address] [place], [place] [address]   MRSA PCR Screening     Status: None   Collection Time: 05/18/20  3:10 AM   Specimen: Nasopharyngeal  Result Value Ref Range Status   MRSA by PCR NEGATIVE NEGATIVE Final    Comment:        The GeneXpert MRSA Assay (FDA approved for NASAL specimens only), is one component of a comprehensive MRSA colonization surveillance program. It is not intended to diagnose MRSA infection nor to guide or monitor treatment for MRSA infections. Performed at [facility], [address] [place], [place] [address]       Scheduled Meds: . aspirin EC  81 mg Oral Daily  . atenolol  25 mg Oral BID  . calcium-vitamin D  1 tablet Oral Q breakfast  . cholecalciferol  5,000 Units Oral Daily  . feeding supplement (ENSURE ENLIVE)  237 mL Oral TID BM  . folic acid  1 mg Oral Daily  . heparin  5,000 Units Subcutaneous Q12H  . lactulose  20 g Oral Daily  . multivitamin with minerals  1 tablet Oral Daily  . pantoprazole  40 mg Oral Daily  . PARoxetine  30 mg Oral Daily  . thiamine  100 mg Oral Daily   Continuous Infusions: . sodium chloride      Assessment/Plan:  1. Alcohol abuse with acute metabolic encephalopathy.  Mental status has improved during the hospital course. 2. Psychiatry team deemed the patient incompetent to make medical decisions at this time. 3. Acute rhabdomyolysis improved with fluids 4. Essential hypertension on atenolol 5. Paroxysmal atrial fibrillation on atenolol.  No anticoagulation with fall history 6. Acquired thrombophilia secondary to paroxysmal atrial fibrillation.  Clot and thrombus formation is higher with paroxysmal atrial fibrillation. 7. Elevated  liver enzymes 8. Severe protein calorie malnutrition 9. Stage II decubiti of the buttocks, partial-thickness loss of dermis 10. 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    Code Status:     Code Status Orders  (From admission, onward)         Start     Ordered   05/17/20 2340  Full code  Continuous        05/17/20 2342        Code Status History    Date Active Date Inactive Code Status Order ID Comments User Context   04/06/2020 2024 04/19/2020 2204 Full Code [id number]  Ivor Costa, MD Inpatient   03/17/2020 1930 03/26/2020 1933 Full Code [id number]  Edwin Dada, MD ED   02/23/2020 2202 03/01/2020 1914 Full Code [id number]  Mansy, Arvella Merles, MD ED   01/17/2020 1512 01/30/2020 1833 Full Code [id number]  Ivor Costa, MD Inpatient   12/09/2019 1237 12/13/2019 1557 Full Code [id number]  Toy Baker, MD ED   12/07/2019 0618 12/09/2019 1237 Full Code [id number]  Gregor Hams, MD ED   09/20/2019 2314 10/03/2019 1741 Full Code [id number]  [name], MD ED   08/14/2019 1932 08/25/2019 2145 Full Code [id number]  Loletha Grayer, MD ED   08/14/2019 1840 08/14/2019 1932 Full Code [id number]  Blake Divine, MD ED   06/12/2019 2308 06/21/2019 2018 Full Code [id number]  Dustin Flock, MD Inpatient   03/29/2019 1337 04/01/2019 1939 DNR [id number]  Philis Pique, NP Inpatient   03/20/2019 1510 03/29/2019 1337 Full Code [id number]  Mayo, Pete Pelt, MD Inpatient   03/20/2019 0507 03/20/2019 1509 Full Code [id number]  Gregor Hams, MD ED   11/16/2018 0101 11/23/2018 1455 Full Code [id number]  Gladstone Lighter, MD ED   10/12/2018 1355 10/13/2018 1839 Full Code [id number]  Arta Silence, MD Inpatient   10/12/2018 0039 10/12/2018 1354 Full Code [id number]  Orbie Pyo, MD ED   05/16/2018 1009 05/16/2018 1611 Full Code [id number]  Schuyler Amor, MD ED   03/12/2018 1605 03/16/2018 2212 DNR [id number]  Jimmy Footman, NP Inpatient   03/08/2018 0950  03/12/2018 1605 Full Code [id number]  Harrie Foreman, MD Inpatient   03/07/2018 1607 03/08/2018 0950 Full Code [id number]  [name], MD ED   11/23/2017 1502 11/26/2017 1642 Full Code [id number]  Vaughan Basta, MD Inpatient   09/22/2017 1202 09/22/2017 2017 Full Code [id number]  Nena Polio, MD ED   09/16/2017 2344 09/18/2017 1550 Full Code [id number]  Lance Coon, MD Inpatient   09/16/2017 2037 09/16/2017 2343 Full Code [id number]  Orbie Pyo, MD ED   12/22/2016 1126 12/25/2016 1910 DNR [id number]  [name], MD Inpatient   12/17/2016 1147 12/22/2016 1126 Full Code [id number]  Henreitta Leber, MD Inpatient   09/12/2015 2108 09/15/2015 1501 Full Code [id number]  Henreitta Leber, MD Inpatient   07/09/2015 1559 07/11/2015 2110 Full Code [id number]  Bettey Costa, MD Inpatient   Advance Care Planning Activity     Family Communication: Spoke with daughter yesterday Disposition Plan: Status is: Inpatient  Dispo: The patient is from: Home              Anticipated d/c is to: Acute inpatient alcohol detox program              Anticipated d/c date is: Whenever alcohol detox program is arranged              Patient currently medically stable to dispo to a supervised environment.  Unsafe discharge home at this time.  Time spent: 26 minutes  [place]

## 2020-05-27 NOTE — TOC Progression Note (Signed)
Transition of Care (TOC) - Progression Note    Patient Details  Name: Douglas Peterson. MRN: [id number] Date of Birth: [birth year]  Transition of Care [facility]) CM/SW [place], [place] Phone Number: 05/27/2020, 10:30 AM  Clinical Narrative:     CSW was contacted by [facility] to obtain [facility] and health insurance information. They will call back. (9am)  [facility] ([phone]) should take his insurance. Will call on Monday regarding admissions [place] - Will call [facility] at [phone] ex. [phone] about admissions.   CSW called [facility] at 2:15pm: Admissions is still reviewing. CSW called [facility] at 2:20pm: Went to [employer]. Left a voicemail.   Expected Discharge Plan: [place] (However this may change based upon PT consults) Barriers to Discharge: Barriers Unresolved (comment)  Expected Discharge Plan and Services Expected Discharge Plan: [place] (However this may change based upon PT consults) In-house Referral: Clinical Social Work Discharge Planning Services: CM Consult   Living arrangements for the past 2 months: Single Family Home                           HH Arranged: RN [facility] Agency: [place] Date [place]: 05/19/20   Representative spoke with at [place]: [place] ([place]) Interventions    Readmission Risk Interventions Readmission Risk Prevention Plan 05/19/2020 12/13/2019 12/12/2019  Transportation Screening Complete Complete Complete  PCP or Specialist Appt within 3-5 Days - (No Data) -  [place] or [place] - (No Data) -  Palliative Care Screening - Complete Complete  Medication Review (RN Care Manager) Complete Complete Complete  PCP or Specialist appointment within 3-5 days of discharge Complete - -  [place] or Home Care Consult Complete - -  SW Recovery Care/Counseling Consult Complete - -   Palliative Care Screening Not Applicable - -  [place] Not Applicable - -  Some recent data might be hidden

## 2020-05-28 ENCOUNTER — Other Ambulatory Visit: Payer: Self-pay

## 2020-05-28 DIAGNOSIS — F10230 Alcohol dependence with withdrawal, uncomplicated: Secondary | ICD-10-CM | POA: Diagnosis not present

## 2020-05-28 DIAGNOSIS — F1020 Alcohol dependence, uncomplicated: Secondary | ICD-10-CM

## 2020-05-28 DIAGNOSIS — F1021 Alcohol dependence, in remission: Secondary | ICD-10-CM

## 2020-05-28 LAB — VITAMIN B1: Vitamin B1 (Thiamine): 310.5 nmol/L — ABNORMAL HIGH (ref 66.5–200.0)

## 2020-05-28 MED ORDER — BACLOFEN 5 MG PO TABS: 5.0000 mg | ORAL_TABLET | Freq: Three times a day (TID) | ORAL | 0 refills | Status: DC | PRN

## 2020-05-28 MED ORDER — PAROXETINE HCL 30 MG PO TABS: 30.0000 mg | ORAL_TABLET | Freq: Every day | ORAL | 0 refills | Status: DC

## 2020-05-28 MED ORDER — THIAMINE HCL 100 MG PO TABS: 100.0000 mg | ORAL_TABLET | Freq: Every day | ORAL | 0 refills | Status: DC

## 2020-05-28 MED ORDER — LACTULOSE 10 GM/15ML PO SOLN: 10.0000 g | Freq: Every day | ORAL | 0 refills | Status: DC

## 2020-05-28 NOTE — Progress Notes (Signed)
Douglas Peterson. to be D/C'd Home per MD order.  Discussed prescriptions and follow up appointments with the patient. Prescriptions given to patient, medication list explained in detail. Pt verbalized understanding.  Allergies as of 05/28/2020   No Known Allergies     Medication List    STOP taking these medications   polyethylene glycol 17 g packet Commonly known as: MIRALAX / GLYCOLAX     TAKE these medications   acetaminophen 500 MG tablet Commonly known as: TYLENOL Take 500 mg by mouth every 6 (six) hours as needed.   ascorbic acid 1000 MG tablet Commonly known as: VITAMIN C Take 1,000 mg by mouth daily.   aspirin EC 81 MG tablet Take 81 mg by mouth daily. Swallow whole.   atenolol 25 MG tablet Commonly known as: TENORMIN Take 25 mg by mouth 2 (two) times daily.   Baclofen 5 MG Tabs Take 5 mg by mouth 3 (three) times daily as needed for muscle spasms.   calcium-vitamin D 500-200 MG-UNIT tablet Commonly known as: OSCAL WITH D Take 1 tablet by mouth daily with breakfast.   Cholecalciferol 125 MCG (5000 UT) Tabs Take 5,000 Units by mouth daily.   docusate sodium 100 MG capsule Commonly known as: Colace Take 1 capsule (100 mg total) by mouth 2 (two) times daily.   doxepin 10 MG capsule Commonly known as: SINEQUAN Take 10 mg by mouth at bedtime as needed.   feeding supplement (ENSURE ENLIVE) Liqd Take 237 mLs by mouth 2 (two) times daily between meals.   folic acid 1 MG tablet Commonly known as: FOLVITE Take 1 tablet (1 mg total) by mouth daily.   lactulose 10 GM/15ML solution Commonly known as: CHRONULAC Take 15 mLs (10 g total) by mouth daily. Start taking on: May 29, 2020   multivitamin with minerals Tabs tablet Take 1 tablet by mouth daily.   omeprazole 20 MG tablet Commonly known as: PriLOSEC OTC Take 1 tablet (20 mg total) by mouth daily.   PARoxetine 30 MG tablet Commonly known as: PAXIL Take 1 tablet (30 mg total) by mouth daily. Take 1  tablet (30 mg total) by mouth once daily For anxiety What changed:   how much to take  how to take this  when to take this   thiamine 100 MG tablet Take 1 tablet (100 mg total) by mouth daily.       Vitals:   05/28/20 0942 05/28/20 1226  BP: 120/76 (!) 131/88  Pulse: 63 69  Resp:  16  Temp:  98.1 F (36.7 C)  SpO2: 95% 94%    Skin clean, dry and intact without evidence of skin break down, no evidence of skin tears noted. IV catheter discontinued intact. Site without signs and symptoms of complications. Dressing and pressure applied. Pt denies pain at this time. No complaints noted.  An After Visit Summary was printed and given to the patient. Patient escorted via [place], and D/C home via private auto.  Fuller Mandril, RN
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
## 2020-05-28 NOTE — Care Management Important Message (Signed)
Important Message  Patient Details  Name: Douglas Peterson. MRN: [id number] Date of Birth: 1951-03-21   Medicare Important Message Given:  Yes     [name] 05/28/2020, 2:30 PM

## 2020-05-28 NOTE — Discharge Instructions (Signed)
No Driving  Alcohol Abuse and Dependence Information, Adult Alcohol is a widely available drug. People drink alcohol in different amounts. People who drink alcohol very often and in large amounts often have problems during and after drinking. They may develop what is called an alcohol use disorder. There are two main types of alcohol use disorders:  Alcohol abuse. This is when you use alcohol too much or too often. You may use alcohol to make yourself feel happy or to reduce stress. You may have a hard time setting a limit on the amount you drink.  Alcohol dependence. This is when you use alcohol consistently for a period of time, and your body changes as a result. This can make it hard to stop drinking because you may start to feel sick or feel different when you do not use alcohol. These symptoms are known as withdrawal. How can alcohol abuse and dependence affect me? Alcohol abuse and dependence can have a negative effect on your life. Drinking too much can lead to addiction. You may feel like you need alcohol to function normally. You may drink alcohol before work in the morning, during the day, or as soon as you get home from work in the evening. These actions can result in:  Poor work performance.  Job loss.  Financial problems.  Car crashes or criminal charges from driving after drinking alcohol.  Problems in your relationships with friends and family.  Losing the trust and respect of coworkers, friends, and family. Drinking heavily over a long period of time can permanently damage your body and brain, and can cause lifelong health issues, such as:  Damage to your liver or pancreas.  Heart problems, high blood pressure, or stroke.  Certain cancers.  Decreased ability to fight infections.  Brain or nerve damage.  Depression.  Early (premature) death. If you are careless or you crave alcohol, it is easy to drink more than your body can handle (overdose). Alcohol overdose is  a serious situation that requires hospitalization. It may lead to permanent injuries or death. What can increase my risk?  Having a family history of alcohol abuse.  Having depression or other mental health conditions.  Beginning to drink at an early age.  Binge drinking often.  Experiencing trauma, stress, and an unstable home life during childhood.  Spending time with people who drink often. What actions can I take to prevent or manage alcohol abuse and dependence?  Do not drink alcohol if: ? Your health care provider tells you not to drink. ? You are pregnant, may be pregnant, or are planning to become pregnant.  If you drink alcohol: ? Limit how much you use to:  0-1 drink a day for women.  0-2 drinks a day for men. ? Be aware of how much alcohol is in your drink. In the [place] one drink equals one 12 oz bottle of beer (355 mL), one 5 oz glass of wine (148 mL), or one 1 oz glass of hard liquor (44 mL).  Stop drinking if you have been drinking too much. This can be very hard to do if you are used to abusing alcohol. If you begin to have withdrawal symptoms, talk with your health care provider or a person that you trust. These symptoms may include anxiety, shaky hands, headache, nausea, sweating, or not being able to sleep.  Choose to drink nonalcoholic beverages in social gatherings and places where there may be alcohol. Activity  Spend more time on activities that you  enjoy that do not involve alcohol, like hobbies or exercise.  Find healthy ways to cope with stress, such as exercise, meditation, or spending time with people you care about. General information  Talk to your family, coworkers, and friends about supporting you in your efforts to stop drinking. If they drink, ask them not to drink around you. Spend more time with people who do not drink alcohol.  If you think that you have an alcohol dependency problem: ? Tell friends or family about your concerns. ? Talk  with your health care provider or another health professional about where to get help. ? Work with a Transport planner and a Regulatory affairs officer. ? Consider joining a support group for people who struggle with alcohol abuse and dependence. Where to find support   Your health care provider.  SMART Recovery: www.smartrecovery.org Therapy and support groups  Local treatment centers or chemical dependency counselors.  Local AA groups in your community: NicTax.com.pt Where to find more information  Centers for Disease Control and Prevention: http://www.wolf.info/  National Institute on Alcohol Abuse and Alcoholism: http://www.bradshaw.com/  Alcoholics Anonymous (AA): NicTax.com.pt Contact a health care provider if:  You drank more or for longer than you intended on more than one occasion.  You tried to stop drinking or to cut back on how much you drink, but you were not able to.  You often drink to the point of vomiting or passing out.  You want to drink so badly that you cannot think about anything else.  You have problems in your life due to drinking, but you continue to drink.  You keep drinking even though you feel anxious, depressed, or have experienced memory loss.  You have stopped doing the things you used to enjoy in order to drink.  You have to drink more than you used to in order to get the effect you want.  You experience anxiety, sweating, nausea, shakiness, and trouble sleeping when you try to stop drinking. Get help right away if:  You have thoughts about hurting yourself or others.  You have serious withdrawal symptoms, including: ? Confusion. ? Racing heart. ? High blood pressure. ? Fever. If you ever feel like you may hurt yourself or others, or have thoughts about taking your own life, get help right away. You can go to your nearest emergency department or call:  Your local emergency services (911 in the [place]  A suicide crisis helpline, such as the [place] at 949 261 9234. This is open 24 hours a day. Summary  Alcohol abuse and dependence can have a negative effect on your life. Drinking too much or too often can lead to addiction.  If you drink alcohol, limit how much you use.  If you are having trouble keeping your drinking under control, find ways to change your behavior. Hobbies, calming activities, exercise, or support groups can help.  If you feel you need help with changing your drinking habits, talk with your health care provider, a good friend, or a therapist, or go to an [place] group. This information is not intended to replace advice given to you by your health care provider. Make sure you discuss any questions you have with your health care provider. Document Revised: 02/08/2019 Document Reviewed: 12/28/2018 Elsevier Patient Education  [place].   Alcohol Use Disorder Alcohol use disorder is when your drinking disrupts your daily life. When you have this condition, you drink too much alcohol and you cannot control your drinking. Alcohol use  disorder can cause serious problems with your physical health. It can affect your brain, heart, liver, pancreas, immune system, stomach, and intestines. Alcohol use disorder can increase your risk for certain cancers and cause problems with your mental health, such as depression, anxiety, psychosis, delirium, and dementia. People with this disorder risk hurting themselves and others. What are the causes? This condition is caused by drinking too much alcohol over time. It is not caused by drinking too much alcohol only one or two times. Some people with this condition drink alcohol to cope with or escape from negative life events. Others drink to relieve pain or symptoms of mental illness. What increases the risk? You are more likely to develop this condition if:  You have a family history of alcohol use disorder.  Your culture encourages drinking to the point of  intoxication, or makes alcohol easy to get.  You had a mood or conduct disorder in childhood.  You have been a victim of abuse.  You are an adolescent and: ? You have poor grades or difficulties in school. ? Your caregivers do not talk to you about saying no to alcohol, or supervise your activities. ? You are impulsive or you have trouble with self-control. What are the signs or symptoms? Symptoms of this condition include:  Drinkingmore than you want to.  Drinking for longer than you want to.  Trying several times to drink less or to control your drinking.  Spending a lot of time getting alcohol, drinking, or recovering from drinking.  Craving alcohol.  Having problems at work, at school, or at home due to drinking.  Having problems in relationships due to drinking.  Drinking when it is dangerous to drink, such as before driving a car.  Continuing to drink even though you know you might have a physical or mental problem related to drinking.  Needing more and more alcohol to get the same effect you want from the alcohol (building up tolerance).  Having symptoms of withdrawal when you stop drinking. Symptoms of withdrawal include: ? Fatigue. ? Nightmares. ? Trouble sleeping. ? Depression. ? Anxiety. ? Fever. ? Seizures. ? Severe confusion. ? Feeling or seeing things that are not there (hallucinations). ? Tremors. ? Rapid heart rate. ? Rapid breathing. ? High blood pressure.  Drinking to avoid symptoms of withdrawal. How is this diagnosed? This condition is diagnosed with an assessment. Your health care provider may start the assessment by asking three or four questions about your drinking. Your health care provider may perform a physical exam or do lab tests to see if you have physical problems resulting from alcohol use. She or he may refer you to a mental health professional for evaluation. How is this treated? Some people with alcohol use disorder are able to  reduce their alcohol use to low-risk levels. Others need to completely quit drinking alcohol. When necessary, mental health professionals with specialized training in substance use treatment can help. Your health care provider can help you decide how severe your alcohol use disorder is and what type of treatment you need. The following forms of treatment are available:  Detoxification. Detoxification involves quitting drinking and using prescription medicines within the first week to help lessen withdrawal symptoms. This treatment is important for people who have had withdrawal symptoms before and for heavy drinkers who are likely to have withdrawal symptoms. Alcohol withdrawal can be dangerous, and in severe cases, it can cause death. Detoxification may be provided in a home, community, or primary care  setting, or in a hospital or substance use treatment facility.  Counseling. This treatment is also called talk therapy. It is provided by substance use treatment counselors. A counselor can address the reasons you use alcohol and suggest ways to keep you from drinking again or to prevent problem drinking. The goals of talk therapy are to: ? Find healthy activities and ways for you to cope with stress. ? Identify and avoid the things that trigger your alcohol use. ? Help you learn how to handle cravings.  Medicines.Medicines can help treat alcohol use disorder by: ? Decreasing alcohol cravings. ? Decreasing the positive feeling you have when you drink alcohol. ? Causing an uncomfortable physical reaction when you drink alcohol (aversion therapy).  Support groups. Support groups are led by people who have quit drinking. They provide emotional support, advice, and guidance. These forms of treatment are often combined. Some people with this condition benefit from a combination of treatments provided by specialized substance use treatment centers. Follow these instructions at home:  Take  over-the-counter and prescription medicines only as told by your health care provider.  Check with your health care provider before starting any new medicines.  Ask friends and family members not to offer you alcohol.  Avoid situations where alcohol is served, including gatherings where others are drinking alcohol.  Create a plan for what to do when you are tempted to use alcohol.  Find hobbies or activities that you enjoy that do not include alcohol.  Keep all follow-up visits as told by your health care provider. This is important. How is this prevented?  If you drink, limit alcohol intake to no more than 1 drink a day for nonpregnant women and 2 drinks a day for men. One drink equals 12 oz of beer, 5 oz of wine, or 1 oz of hard liquor.  If you have a mental health condition, get treatment and support.  Do not give alcohol to adolescents.  If you are an adolescent: ? Do not drink alcohol. ? Do not be afraid to say no if someone offers you alcohol. Speak up about why you do not want to drink. You can be a positive role model for your friends and set a good example for those around you by not drinking alcohol. ? If your friends drink, spend time with others who do not drink alcohol. Make new friends who do not use alcohol. ? Find healthy ways to manage stress and emotions, such as meditation or deep breathing, exercise, spending time in nature, listening to music, or talking with a trusted friend or family member. Contact a health care provider if:  You are not able to take your medicines as told.  Your symptoms get worse.  You return to drinking alcohol (relapse) and your symptoms get worse. Get help right away if:  You have thoughts about hurting yourself or others. If you ever feel like you may hurt yourself or others, or have thoughts about taking your own life, get help right away. You can go to your nearest emergency department or call:  Your local emergency services (911  in the [place]  A suicide crisis helpline, such as the [place] at 8301528629. This is open 24 hours a day. Summary  Alcohol use disorder is when your drinking disrupts your daily life. When you have this condition, you drink too much alcohol and you cannot control your drinking.  Treatment may include detoxification, counseling, medicine, and support groups.  Ask friends and  family members not to offer you alcohol. Avoid situations where alcohol is served.  Get help right away if you have thoughts about hurting yourself or others. This information is not intended to replace advice given to you by your health care provider. Make sure you discuss any questions you have with your health care provider. Document Revised: 10/02/2017 Document Reviewed: 07/17/2016 Elsevier Patient Education  [place].

## 2020-05-28 NOTE — TOC Transition Note (Signed)
Transition of Care [facility]) - CM/SW Discharge Note   Patient Details  Name: Douglas Peterson. MRN: [id number] Date of Birth: 12-17-1950  [facility]) CM/SW Contact:  Beverly Sessions, RN Phone Number: 05/28/2020, 2:39 PM   Clinical Narrative:     Psych has evaluated patient today and states that patient has capacity.  MD has placed discharge and patient will return home today  Confirmed with patient that he wishes to discharge home today.  Patient declines any home health services, and additional substance abuse resources.   Patient will be leaving by cab, confirms that he has money and will be paying for a cab.  Bedside RN to call cab.  Patient states that his front door is open.    [name] with DSS updated.  She is in the process of updating her supervisor.  Provided her with updated note from psych confirming that patient has capacity.   Patient gave permission for me to update his daughter Douglas Peterson.  I have updated her and she is aware of the above information.   Final next level of care: Home/Self Care Barriers to Discharge: No Barriers Identified   Patient Goals and CMS Choice        Discharge Placement                       Discharge Plan and Services In-house Referral: Clinical Social Work Discharge Planning Services: CM Consult                      HH Arranged: Patient Refused [place] Agency: [place] Date [place]: 05/19/20   Representative spoke with at [place]: [place]) Interventions     Readmission Risk Interventions Readmission Risk Prevention Plan 05/19/2020 12/13/2019 12/12/2019  Transportation Screening Complete Complete Complete  PCP or Specialist Appt within 3-5 Days - (No Data) -  HRI or [place] - (No Data) -  Palliative Care Screening - Complete Complete  Medication Review (RN Care Manager) Complete Complete Complete  PCP or Specialist  appointment within 3-5 days of discharge Complete - -  [place] or Home Care Consult Complete - -  SW Recovery Care/Counseling Consult Complete - -  Palliative Care Screening Not Applicable - -  [place] Not Applicable - -  Some recent data might be hidden
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
## 2020-05-28 NOTE — TOC Progression Note (Signed)
Transition of Care (TOC) - Progression Note    Patient Details  Name: Douglas Peterson. MRN: [id number] Date of Birth: 1951-08-03  Transition of [facility]) CM/SW Contact  Beverly Sessions, RN Phone Number: 05/28/2020, 10:31 AM  Clinical Narrative:     Spoke with [place]- she states that if we are able to find patient a residential treatment program then daughter could sign him in to the program I she is willing  Spoke with Daughter [name].  She states she is willing to sign him in.  However she is leaving out of town tomorrow, and will no be back until satuday  10:10 am - [facility] [place] spoke with  [name] [phone] -  Due to patient already being in hospital for 11 days he would not qualify for their detox program.  He would potentially qualify for their partial hospitalization program which is 14-28 days.  He would have to come voluntarily.  He would be unsupervised from the hours of 7p-7a, and live in a lodge with other males.  They are requesting updated clinical and documentation about lack of capacity.  Will fax to [phone].  [name] states the MD at the facility will review, then if appropriate they will reach out to me to set up a phone interview.   RNCM to continue to reach out to other facilities in notes from over the weekend   Expected Discharge Plan: [name] (However this may change based upon PT consults) Barriers to Discharge: Barriers Unresolved (comment)  Expected Discharge Plan and Services Expected Discharge Plan: [place] (However this may change based upon PT consults) In-house Referral: Clinical Social Work Discharge Planning Services: CM Consult   Living arrangements for the past 2 months: Single Family Home                           HH Arranged: RN [facility] Agency: [place] Date [place]: 05/19/20   Representative spoke  with at [place]: [place] (SDOH) Interventions    Readmission Risk Interventions Readmission Risk Prevention Plan 05/19/2020 12/13/2019 12/12/2019  Transportation Screening Complete Complete Complete  PCP or Specialist Appt within 3-5 Days - (No Data) -  HRI or [place] - (No Data) -  Palliative Care Screening - Complete Complete  Medication Review (RN Care Manager) Complete Complete Complete  PCP or Specialist appointment within 3-5 days of discharge Complete - -  [place] or Home Care Consult Complete - -  SW Recovery Care/Counseling Consult Complete - -  Palliative Care Screening Not Applicable - -  [place] Not Applicable - -  Some recent data might be hidden

## 2020-05-28 NOTE — Progress Notes (Signed)
Per Janett Billow at [facility], next availability for a taxi to come and retrieve patient would be after 3:00pm. Janett Billow would call RN directly when driver is on the way.   Fuller Mandril, RN

## 2020-06-05 ENCOUNTER — Emergency Department: Payer: Medicare HMO

## 2020-06-05 ENCOUNTER — Other Ambulatory Visit: Payer: Self-pay

## 2020-06-05 ENCOUNTER — Observation Stay: Payer: Medicare HMO

## 2020-06-05 ENCOUNTER — Encounter: Payer: Self-pay | Admitting: Emergency Medicine

## 2020-06-05 ENCOUNTER — Inpatient Hospital Stay
Admission: EM | Admit: 2020-06-05 | Discharge: 2020-06-26 | DRG: 896 | Disposition: A | Discharge status: Home / Self Care | Payer: Medicare HMO | Attending: Internal Medicine | Admitting: Internal Medicine

## 2020-06-05 DIAGNOSIS — Z801 Family history of malignant neoplasm of trachea, bronchus and lung: Secondary | ICD-10-CM

## 2020-06-05 DIAGNOSIS — Y92009 Unspecified place in unspecified non-institutional (private) residence as the place of occurrence of the external cause: Secondary | ICD-10-CM

## 2020-06-05 DIAGNOSIS — Z87820 Personal history of traumatic brain injury: Secondary | ICD-10-CM

## 2020-06-05 DIAGNOSIS — T796XXA Traumatic ischemia of muscle, initial encounter: Secondary | ICD-10-CM | POA: Diagnosis present

## 2020-06-05 DIAGNOSIS — F329 Major depressive disorder, single episode, unspecified: Secondary | ICD-10-CM | POA: Diagnosis present

## 2020-06-05 DIAGNOSIS — G252 Other specified forms of tremor: Secondary | ICD-10-CM | POA: Diagnosis not present

## 2020-06-05 DIAGNOSIS — F10929 Alcohol use, unspecified with intoxication, unspecified: Secondary | ICD-10-CM | POA: Diagnosis not present

## 2020-06-05 DIAGNOSIS — F32A Depression, unspecified: Secondary | ICD-10-CM | POA: Diagnosis present

## 2020-06-05 DIAGNOSIS — R54 Age-related physical debility: Secondary | ICD-10-CM | POA: Diagnosis present

## 2020-06-05 DIAGNOSIS — F10939 Alcohol use, unspecified with withdrawal, unspecified: Secondary | ICD-10-CM | POA: Diagnosis present

## 2020-06-05 DIAGNOSIS — Z6820 Body mass index (BMI) 20.0-20.9, adult: Secondary | ICD-10-CM

## 2020-06-05 DIAGNOSIS — S42031A Displaced fracture of lateral end of right clavicle, initial encounter for closed fracture: Secondary | ICD-10-CM | POA: Diagnosis present

## 2020-06-05 DIAGNOSIS — I480 Paroxysmal atrial fibrillation: Secondary | ICD-10-CM | POA: Diagnosis present

## 2020-06-05 DIAGNOSIS — G9349 Other encephalopathy: Secondary | ICD-10-CM | POA: Diagnosis present

## 2020-06-05 DIAGNOSIS — R0602 Shortness of breath: Secondary | ICD-10-CM

## 2020-06-05 DIAGNOSIS — Z7982 Long term (current) use of aspirin: Secondary | ICD-10-CM

## 2020-06-05 DIAGNOSIS — L89151 Pressure ulcer of sacral region, stage 1: Secondary | ICD-10-CM | POA: Diagnosis present

## 2020-06-05 DIAGNOSIS — Z87891 Personal history of nicotine dependence: Secondary | ICD-10-CM

## 2020-06-05 DIAGNOSIS — L89322 Pressure ulcer of left buttock, stage 2: Secondary | ICD-10-CM | POA: Diagnosis present

## 2020-06-05 DIAGNOSIS — R64 Cachexia: Secondary | ICD-10-CM | POA: Diagnosis present

## 2020-06-05 DIAGNOSIS — F1010 Alcohol abuse, uncomplicated: Secondary | ICD-10-CM | POA: Diagnosis present

## 2020-06-05 DIAGNOSIS — W19XXXA Unspecified fall, initial encounter: Secondary | ICD-10-CM | POA: Diagnosis not present

## 2020-06-05 DIAGNOSIS — Z8249 Family history of ischemic heart disease and other diseases of the circulatory system: Secondary | ICD-10-CM

## 2020-06-05 DIAGNOSIS — E43 Unspecified severe protein-calorie malnutrition: Secondary | ICD-10-CM | POA: Diagnosis present

## 2020-06-05 DIAGNOSIS — F10239 Alcohol dependence with withdrawal, unspecified: Secondary | ICD-10-CM | POA: Diagnosis not present

## 2020-06-05 DIAGNOSIS — Z20822 Contact with and (suspected) exposure to covid-19: Secondary | ICD-10-CM | POA: Diagnosis present

## 2020-06-05 DIAGNOSIS — S42033A Displaced fracture of lateral end of unspecified clavicle, initial encounter for closed fracture: Secondary | ICD-10-CM | POA: Diagnosis present

## 2020-06-05 DIAGNOSIS — K219 Gastro-esophageal reflux disease without esophagitis: Secondary | ICD-10-CM | POA: Diagnosis present

## 2020-06-05 DIAGNOSIS — I10 Essential (primary) hypertension: Secondary | ICD-10-CM | POA: Diagnosis present

## 2020-06-05 DIAGNOSIS — Z79899 Other long term (current) drug therapy: Secondary | ICD-10-CM

## 2020-06-05 DIAGNOSIS — R001 Bradycardia, unspecified: Secondary | ICD-10-CM | POA: Diagnosis present

## 2020-06-05 DIAGNOSIS — M6282 Rhabdomyolysis: Secondary | ICD-10-CM | POA: Diagnosis present

## 2020-06-05 DIAGNOSIS — L89501 Pressure ulcer of unspecified ankle, stage 1: Secondary | ICD-10-CM | POA: Diagnosis present

## 2020-06-05 DIAGNOSIS — Y908 Blood alcohol level of 240 mg/100 ml or more: Secondary | ICD-10-CM | POA: Diagnosis present

## 2020-06-05 DIAGNOSIS — F10229 Alcohol dependence with intoxication, unspecified: Secondary | ICD-10-CM | POA: Diagnosis present

## 2020-06-05 DIAGNOSIS — R0603 Acute respiratory distress: Secondary | ICD-10-CM | POA: Diagnosis not present

## 2020-06-05 DIAGNOSIS — R159 Full incontinence of feces: Secondary | ICD-10-CM | POA: Diagnosis present

## 2020-06-05 DIAGNOSIS — K5900 Constipation, unspecified: Secondary | ICD-10-CM | POA: Diagnosis not present

## 2020-06-05 DIAGNOSIS — R197 Diarrhea, unspecified: Secondary | ICD-10-CM | POA: Diagnosis present

## 2020-06-05 DIAGNOSIS — D6959 Other secondary thrombocytopenia: Secondary | ICD-10-CM | POA: Diagnosis not present

## 2020-06-05 DIAGNOSIS — R1310 Dysphagia, unspecified: Secondary | ICD-10-CM | POA: Diagnosis present

## 2020-06-05 DIAGNOSIS — E871 Hypo-osmolality and hyponatremia: Secondary | ICD-10-CM | POA: Diagnosis present

## 2020-06-05 LAB — CBC WITH DIFFERENTIAL/PLATELET
Abs Immature Granulocytes: 0.02 10*3/uL (ref 0.00–0.07)
Basophils Absolute: 0.1 10*3/uL (ref 0.0–0.1)
Basophils Relative: 1 %
Eosinophils Absolute: 0 10*3/uL (ref 0.0–0.5)
Eosinophils Relative: 0 %
HCT: 41.2 % (ref 39.0–52.0)
Hemoglobin: 14.3 g/dL (ref 13.0–17.0)
Immature Granulocytes: 0 %
Lymphocytes Relative: 59 %
Lymphs Abs: 8.2 10*3/uL — ABNORMAL HIGH (ref 0.7–4.0)
MCH: 30.7 pg (ref 26.0–34.0)
MCHC: 34.7 g/dL (ref 30.0–36.0)
MCV: 88.4 fL (ref 80.0–100.0)
Monocytes Absolute: 0.9 10*3/uL (ref 0.1–1.0)
Monocytes Relative: 6 %
Neutro Abs: 4.8 10*3/uL (ref 1.7–7.7)
Neutrophils Relative %: 34 %
Platelets: 252 10*3/uL (ref 150–400)
RBC: 4.66 MIL/uL (ref 4.22–5.81)
RDW: 13.5 % (ref 11.5–15.5)
Smear Review: NORMAL
WBC: 14 10*3/uL — ABNORMAL HIGH (ref 4.0–10.5)
nRBC: 0 % (ref 0.0–0.2)

## 2020-06-05 LAB — COMPREHENSIVE METABOLIC PANEL
ALT: 30 U/L (ref 0–44)
AST: 52 U/L — ABNORMAL HIGH (ref 15–41)
Albumin: 4.5 g/dL (ref 3.5–5.0)
Alkaline Phosphatase: 89 U/L (ref 38–126)
Anion gap: 13 (ref 5–15)
BUN: <5 mg/dL — ABNORMAL LOW (ref 8–23)
CO2: 25 mmol/L (ref 22–32)
Calcium: 8.4 mg/dL — ABNORMAL LOW (ref 8.9–10.3)
Chloride: 91 mmol/L — ABNORMAL LOW (ref 98–111)
Creatinine, Ser: 0.51 mg/dL — ABNORMAL LOW (ref 0.61–1.24)
GFR calc Af Amer: >60 mL/min (ref >60)
GFR calc non Af Amer: >60 mL/min (ref >60)
Glucose, Bld: 91 mg/dL (ref 70–99)
Potassium: 4.2 mmol/L (ref 3.5–5.1)
Sodium: 129 mmol/L — ABNORMAL LOW (ref 135–145)
Total Bilirubin: 1.4 mg/dL — ABNORMAL HIGH (ref 0.3–1.2)
Total Protein: 7.8 g/dL (ref 6.5–8.1)

## 2020-06-05 LAB — BASIC METABOLIC PANEL
Anion gap: 13 (ref 5–15)
BUN: <5 mg/dL — ABNORMAL LOW (ref 8–23)
CO2: 25 mmol/L (ref 22–32)
Calcium: 8.7 mg/dL — ABNORMAL LOW (ref 8.9–10.3)
Chloride: 100 mmol/L (ref 98–111)
Creatinine, Ser: 0.48 mg/dL — ABNORMAL LOW (ref 0.61–1.24)
GFR calc Af Amer: >60 mL/min (ref >60)
GFR calc non Af Amer: >60 mL/min (ref >60)
Glucose, Bld: 84 mg/dL (ref 70–99)
Potassium: 4.1 mmol/L (ref 3.5–5.1)
Sodium: 138 mmol/L (ref 135–145)

## 2020-06-05 LAB — TROPONIN I (HIGH SENSITIVITY): Troponin I (High Sensitivity): 4 ng/L (ref <18)

## 2020-06-05 LAB — LIPASE, BLOOD: Lipase: 29 U/L (ref 11–51)

## 2020-06-05 LAB — LACTIC ACID, PLASMA
Lactic Acid, Venous: 2.7 mmol/L (ref 0.5–1.9)
Lactic Acid, Venous: 1.2 mmol/L (ref 0.5–1.9)

## 2020-06-05 LAB — CK
Total CK: 568 U/L — ABNORMAL HIGH (ref 49–397)
Total CK: 452 U/L — ABNORMAL HIGH (ref 49–397)

## 2020-06-05 LAB — URINALYSIS, ROUTINE W REFLEX MICROSCOPIC
Bilirubin Urine: NEGATIVE
Glucose, UA: NEGATIVE mg/dL
Hgb urine dipstick: NEGATIVE
Ketones, ur: NEGATIVE mg/dL
Leukocytes,Ua: NEGATIVE
Nitrite: NEGATIVE
Protein, ur: NEGATIVE mg/dL
Specific Gravity, Urine: 1.001 — ABNORMAL LOW (ref 1.005–1.030)
pH: 6 (ref 5.0–8.0)

## 2020-06-05 LAB — PHOSPHORUS: Phosphorus: 2.9 mg/dL (ref 2.5–4.6)

## 2020-06-05 LAB — ETHANOL: Alcohol, Ethyl (B): 308 mg/dL (ref <10)

## 2020-06-05 LAB — MAGNESIUM: Magnesium: 2.1 mg/dL (ref 1.7–2.4)

## 2020-06-05 LAB — SARS CORONAVIRUS 2 BY RT PCR (HOSPITAL ORDER, PERFORMED IN ~~LOC~~ HOSPITAL LAB): SARS Coronavirus 2: NEGATIVE

## 2020-06-05 LAB — AMMONIA: Ammonia: 21 umol/L (ref 9–35)

## 2020-06-05 MED ORDER — ACETAMINOPHEN 325 MG PO TABS
650.0000 mg | ORAL_TABLET | Freq: Four times a day (QID) | ORAL | Status: DC | PRN
  Administered 2020-06-06 – 2020-06-09 (×3): 650 mg via ORAL
  Filled 2020-06-05 (×4): qty 2

## 2020-06-05 MED ORDER — ASPIRIN EC 81 MG PO TBEC
81.0000 mg | DELAYED_RELEASE_TABLET | Freq: Every day | ORAL | Status: DC
  Administered 2020-06-05 – 2020-06-26 (×23): 81 mg via ORAL
  Filled 2020-06-05 (×22): qty 1

## 2020-06-05 MED ORDER — VITAMIN D3 25 MCG (1000 UNIT) PO TABS
5000.0000 [IU] | ORAL_TABLET | Freq: Every day | ORAL | Status: DC
  Administered 2020-06-06 – 2020-06-26 (×21): 5000 [IU] via ORAL
  Filled 2020-06-05 (×41): qty 5

## 2020-06-05 MED ORDER — THIAMINE HCL 100 MG/ML IJ SOLN
100.0000 mg | Freq: Once | INTRAMUSCULAR | Status: AC
  Administered 2020-06-05: 100 mg via INTRAVENOUS
  Filled 2020-06-05: qty 2

## 2020-06-05 MED ORDER — OXYCODONE-ACETAMINOPHEN 5-325 MG PO TABS
1.0000 | ORAL_TABLET | ORAL | Status: DC | PRN
  Administered 2020-06-05 – 2020-06-12 (×21): 1 via ORAL
  Filled 2020-06-05 (×22): qty 1

## 2020-06-05 MED ORDER — LORAZEPAM 2 MG PO TABS
0.0000 mg | ORAL_TABLET | Freq: Four times a day (QID) | ORAL | Status: AC
  Administered 2020-06-05 (×2): 2 mg via ORAL
  Filled 2020-06-05 (×3): qty 1

## 2020-06-05 MED ORDER — LORAZEPAM 2 MG PO TABS: 0.0000 mg | ORAL_TABLET | Freq: Two times a day (BID) | ORAL | Status: DC

## 2020-06-05 MED ORDER — ACETAMINOPHEN 325 MG PO TABS
650.0000 mg | ORAL_TABLET | Freq: Once | ORAL | Status: AC
  Administered 2020-06-05: 650 mg via ORAL
  Filled 2020-06-05: qty 2

## 2020-06-05 MED ORDER — LORAZEPAM 2 MG/ML IJ SOLN
0.0000 mg | Freq: Two times a day (BID) | INTRAMUSCULAR | Status: DC
  Administered 2020-06-06: 2 mg via INTRAVENOUS
  Administered 2020-06-06: 4 mg via INTRAVENOUS

## 2020-06-05 MED ORDER — NAPROXEN 500 MG PO TABS
500.0000 mg | ORAL_TABLET | Freq: Once | ORAL | Status: AC
  Administered 2020-06-05: 500 mg via ORAL
  Filled 2020-06-05: qty 1

## 2020-06-05 MED ORDER — PAROXETINE HCL 30 MG PO TABS
30.0000 mg | ORAL_TABLET | Freq: Every day | ORAL | Status: DC
  Administered 2020-06-06 – 2020-06-26 (×22): 30 mg via ORAL
  Filled 2020-06-05 (×22): qty 1

## 2020-06-05 MED ORDER — SODIUM CHLORIDE 0.9 % IV BOLUS
1000.0000 mL | Freq: Once | INTRAVENOUS | Status: AC
  Administered 2020-06-05: 1000 mL via INTRAVENOUS

## 2020-06-05 MED ORDER — ENSURE ENLIVE PO LIQD
237.0000 mL | Freq: Two times a day (BID) | ORAL | Status: DC
  Administered 2020-06-05 – 2020-06-08 (×6): 237 mL via ORAL

## 2020-06-05 MED ORDER — ADULT MULTIVITAMIN W/MINERALS CH
1.0000 | ORAL_TABLET | Freq: Every day | ORAL | Status: DC
  Administered 2020-06-05 – 2020-06-26 (×22): 1 via ORAL
  Filled 2020-06-05 (×22): qty 1

## 2020-06-05 MED ORDER — THIAMINE HCL 100 MG PO TABS
100.0000 mg | ORAL_TABLET | Freq: Every day | ORAL | Status: DC
  Administered 2020-06-05 – 2020-06-26 (×22): 100 mg via ORAL
  Filled 2020-06-05 (×22): qty 1

## 2020-06-05 MED ORDER — MORPHINE SULFATE (PF) 2 MG/ML IV SOLN
0.5000 mg | INTRAVENOUS | Status: DC | PRN
  Administered 2020-06-05 – 2020-06-07 (×5): 0.5 mg via INTRAVENOUS
  Filled 2020-06-05 (×6): qty 1

## 2020-06-05 MED ORDER — ONDANSETRON HCL 4 MG/2ML IJ SOLN: 4.0000 mg | Freq: Three times a day (TID) | INTRAMUSCULAR | Status: DC | PRN

## 2020-06-05 MED ORDER — LORAZEPAM 2 MG/ML IJ SOLN
0.0000 mg | Freq: Four times a day (QID) | INTRAMUSCULAR | Status: AC
  Administered 2020-06-06: 2 mg via INTRAVENOUS
  Administered 2020-06-06 (×2): 4 mg via INTRAVENOUS
  Filled 2020-06-05: qty 1
  Filled 2020-06-05 (×2): qty 2
  Filled 2020-06-05: qty 1

## 2020-06-05 MED ORDER — IBUPROFEN 400 MG PO TABS
600.0000 mg | ORAL_TABLET | Freq: Three times a day (TID) | ORAL | Status: DC | PRN
  Administered 2020-06-05 – 2020-06-06 (×2): 600 mg via ORAL
  Filled 2020-06-05 (×2): qty 2

## 2020-06-05 MED ORDER — OMEPRAZOLE MAGNESIUM 20 MG PO TBEC: 20.0000 mg | DELAYED_RELEASE_TABLET | Freq: Every day | ORAL | Status: DC

## 2020-06-05 MED ORDER — CALCIUM CARBONATE-VITAMIN D 500-200 MG-UNIT PO TABS
1.0000 | ORAL_TABLET | Freq: Every day | ORAL | Status: DC
  Administered 2020-06-06 – 2020-06-26 (×21): 1 via ORAL
  Filled 2020-06-05 (×22): qty 1

## 2020-06-05 MED ORDER — ATENOLOL 25 MG PO TABS
25.0000 mg | ORAL_TABLET | Freq: Two times a day (BID) | ORAL | Status: DC
  Administered 2020-06-05 – 2020-06-25 (×41): 25 mg via ORAL
  Filled 2020-06-05 (×42): qty 1

## 2020-06-05 MED ORDER — THIAMINE HCL 100 MG/ML IJ SOLN
100.0000 mg | Freq: Every day | INTRAMUSCULAR | Status: DC
  Filled 2020-06-05 (×8): qty 2

## 2020-06-05 MED ORDER — SODIUM CHLORIDE 0.9 % IV SOLN: INTRAVENOUS | Status: DC

## 2020-06-05 MED ORDER — PANTOPRAZOLE SODIUM 40 MG PO TBEC
40.0000 mg | DELAYED_RELEASE_TABLET | Freq: Every day | ORAL | Status: DC
  Administered 2020-06-05 – 2020-06-26 (×22): 40 mg via ORAL
  Filled 2020-06-05 (×22): qty 1

## 2020-06-05 MED ORDER — ACETAMINOPHEN 500 MG PO TABS
500.0000 mg | ORAL_TABLET | Freq: Once | ORAL | Status: AC
  Administered 2020-06-05: 500 mg via ORAL
  Filled 2020-06-05: qty 1

## 2020-06-05 MED ORDER — ENOXAPARIN SODIUM 40 MG/0.4ML ~~LOC~~ SOLN
40.0000 mg | SUBCUTANEOUS | Status: DC
  Administered 2020-06-05 – 2020-06-25 (×21): 40 mg via SUBCUTANEOUS
  Filled 2020-06-05 (×21): qty 0.4

## 2020-06-05 MED ORDER — DOXEPIN HCL 10 MG PO CAPS
10.0000 mg | ORAL_CAPSULE | Freq: Every evening | ORAL | Status: DC | PRN
  Administered 2020-06-10: 10 mg via ORAL
  Filled 2020-06-05 (×5): qty 1

## 2020-06-05 MED ORDER — ASCORBIC ACID 500 MG PO TABS
1000.0000 mg | ORAL_TABLET | Freq: Every day | ORAL | Status: DC
  Administered 2020-06-05 – 2020-06-26 (×22): 1000 mg via ORAL
  Filled 2020-06-05 (×22): qty 2

## 2020-06-05 MED ORDER — METHOCARBAMOL 500 MG PO TABS
500.0000 mg | ORAL_TABLET | Freq: Three times a day (TID) | ORAL | Status: DC | PRN
  Administered 2020-06-09: 500 mg via ORAL
  Filled 2020-06-05 (×3): qty 1

## 2020-06-05 MED ORDER — LIDOCAINE 5 % EX PTCH
1.0000 | MEDICATED_PATCH | CUTANEOUS | Status: DC
  Administered 2020-06-05 – 2020-06-26 (×22): 1 via TRANSDERMAL
  Filled 2020-06-05 (×22): qty 1

## 2020-06-05 MED ORDER — HYDRALAZINE HCL 20 MG/ML IJ SOLN: 5.0000 mg | INTRAMUSCULAR | Status: DC | PRN

## 2020-06-05 MED ORDER — OXYCODONE HCL 5 MG PO TABS
5.0000 mg | ORAL_TABLET | Freq: Once | ORAL | Status: AC
  Administered 2020-06-05: 5 mg via ORAL
  Filled 2020-06-05: qty 1
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
## 2020-06-05 NOTE — ED Notes (Signed)
When attempting to d/c pt, pt refusing to be discharged stating "I aint going home, I cant move".
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
## 2020-06-05 NOTE — ED Notes (Signed)
X-ray at bedside
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
## 2020-06-05 NOTE — ED Notes (Signed)
Date and time results received: 06/05/20 0249 (use smartphrase ".now" to insert current time)  Test: lactic acid 2.7; alcohol 308 Critical Value: 2.7 ; 308  Name of Provider Notified: Karma Greaser

## 2020-06-05 NOTE — ED Notes (Signed)
Pt is remaining in bed and asleep at this time, call light in reach, door open
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
## 2020-06-05 NOTE — ED Notes (Signed)
This nurse unsuccessful x 2 for IV start

## 2020-06-05 NOTE — TOC Initial Note (Signed)
Transition of Care (TOC) - Initial/Assessment Note    Patient Details  Name: Pinchus Weckwerth. MRN: [id number] Date of Birth: April 04, 1951  Transition of Care [facility]) CM/SW Contact:    Anselm Pancoast, RN Phone Number: 06/05/2020, 4:20 PM  Clinical Narrative:                 Spoke to [name], [place] or [phone] regarding readmission. And DSS filing for guardianship. Roni requesting another review for competency due to discrepancy in last 2 reviews. DSS is completing paperwork for guardianship in order to place patient.          Patient Goals and CMS Choice        Expected Discharge Plan and Services                                                Prior Living Arrangements/Services                       Activities of Daily Living      Permission Sought/Granted                  Emotional Assessment              Admission diagnosis:  Fall [W19.XXXA] Patient Active Problem List   Diagnosis Date Noted  . Closed fracture of distal clavicle_Right 06/05/2020  . GERD (gastroesophageal reflux disease) 06/05/2020  . Alcohol use disorder, severe, in early remission, dependence ([place]) 05/28/2020  . Acquired thrombophilia ([place])   . Elevated LFTs 04/06/2020  . Fall 04/06/2020  . Rhabdomyolysis 04/06/2020  . Elevated lactic acid level 04/06/2020  . Sepsis ([place]) 03/17/2020  . Constipation 03/17/2020  . Paroxysmal A-fib ([place]) 03/17/2020  . Severe protein-calorie malnutrition ([place]) 02/25/2020  . Left inguinal hernia 01/31/2020  . Encounter for competency evaluation   . Depression 01/17/2020  . Esophagitis   . Nausea without vomiting   . Weakness   . Tremor   . Pancytopenia ([place])   . Hypomagnesemia   . Alcoholic cirrhosis of liver without ascites ([place])   . Thrombocytopenia ([place])   . Non-traumatic rhabdomyolysis   . Alcohol withdrawal delirium ([place]) 12/08/2019  . ETOH abuse   . Alcoholic intoxication with complication ([place])   .  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 Hypokalemia 09/27/2019  . Dysphagia 09/27/2019  . Alcohol withdrawal delirium, acute, hyperactive ([place]) 09/21/2019  . Pressure injury of skin 08/15/2019  . Goals of care, counseling/discussion   . Palliative care by specialist   . Alcohol withdrawal ([place]) 03/20/2019  . Low back pain 11/15/2018  . Acute metabolic encephalopathy 09/32/3557  . Delirium tremens ([place]) 03/08/2018  . Malnutrition of moderate degree 11/24/2017  . HTN (hypertension) 09/16/2017  . Hypothermia 12/17/2016  . Alcohol use   . Diarrhea   . Hyponatremia 07/09/2015   PCP:  [name], MD Pharmacy:   [place], [place] - [place] [address] [place] [address] Phone: [phone] Fax: [phone]     Social Determinants of Health (SDOH) Interventions    Readmission Risk Interventions Readmission Risk Prevention Plan 05/19/2020 12/13/2019 12/12/2019  Transportation Screening Complete Complete Complete  PCP or Specialist Appt within 3-5 Days - (No Data) -  [place]RI or [place] - (No Data) -  Palliative  Care Screening - Complete Complete  Medication Review (RN Care Manager) Complete Complete Complete  PCP or Specialist appointment within 3-5 days of discharge Complete - -  [place] or Home Care Consult Complete - -  SW Recovery Care/Counseling Consult Complete - -  Palliative Care Screening Not Applicable - -  [place] Not Applicable - -  Some recent data might be hidden

## 2020-06-06 DIAGNOSIS — F10230 Alcohol dependence with withdrawal, uncomplicated: Secondary | ICD-10-CM | POA: Diagnosis not present

## 2020-06-06 DIAGNOSIS — R64 Cachexia: Secondary | ICD-10-CM | POA: Diagnosis present

## 2020-06-06 DIAGNOSIS — Z801 Family history of malignant neoplasm of trachea, bronchus and lung: Secondary | ICD-10-CM | POA: Diagnosis not present

## 2020-06-06 DIAGNOSIS — F10929 Alcohol use, unspecified with intoxication, unspecified: Secondary | ICD-10-CM | POA: Diagnosis not present

## 2020-06-06 DIAGNOSIS — F10229 Alcohol dependence with intoxication, unspecified: Secondary | ICD-10-CM | POA: Diagnosis present

## 2020-06-06 DIAGNOSIS — E43 Unspecified severe protein-calorie malnutrition: Secondary | ICD-10-CM

## 2020-06-06 DIAGNOSIS — L89151 Pressure ulcer of sacral region, stage 1: Secondary | ICD-10-CM | POA: Diagnosis present

## 2020-06-06 DIAGNOSIS — Z79899 Other long term (current) drug therapy: Secondary | ICD-10-CM | POA: Diagnosis not present

## 2020-06-06 DIAGNOSIS — F1010 Alcohol abuse, uncomplicated: Secondary | ICD-10-CM

## 2020-06-06 DIAGNOSIS — Z6820 Body mass index (BMI) 20.0-20.9, adult: Secondary | ICD-10-CM | POA: Diagnosis not present

## 2020-06-06 DIAGNOSIS — E871 Hypo-osmolality and hyponatremia: Secondary | ICD-10-CM | POA: Diagnosis present

## 2020-06-06 DIAGNOSIS — R5381 Other malaise: Secondary | ICD-10-CM

## 2020-06-06 DIAGNOSIS — K219 Gastro-esophageal reflux disease without esophagitis: Secondary | ICD-10-CM | POA: Diagnosis present

## 2020-06-06 DIAGNOSIS — S42031A Displaced fracture of lateral end of right clavicle, initial encounter for closed fracture: Secondary | ICD-10-CM | POA: Diagnosis present

## 2020-06-06 DIAGNOSIS — Z8249 Family history of ischemic heart disease and other diseases of the circulatory system: Secondary | ICD-10-CM | POA: Diagnosis not present

## 2020-06-06 DIAGNOSIS — R159 Full incontinence of feces: Secondary | ICD-10-CM | POA: Diagnosis present

## 2020-06-06 DIAGNOSIS — L89322 Pressure ulcer of left buttock, stage 2: Secondary | ICD-10-CM | POA: Diagnosis present

## 2020-06-06 DIAGNOSIS — F10239 Alcohol dependence with withdrawal, unspecified: Secondary | ICD-10-CM | POA: Diagnosis present

## 2020-06-06 DIAGNOSIS — Y908 Blood alcohol level of 240 mg/100 ml or more: Secondary | ICD-10-CM | POA: Diagnosis present

## 2020-06-06 DIAGNOSIS — I480 Paroxysmal atrial fibrillation: Secondary | ICD-10-CM | POA: Diagnosis present

## 2020-06-06 DIAGNOSIS — R54 Age-related physical debility: Secondary | ICD-10-CM | POA: Diagnosis present

## 2020-06-06 DIAGNOSIS — S42031D Displaced fracture of lateral end of right clavicle, subsequent encounter for fracture with routine healing: Secondary | ICD-10-CM | POA: Diagnosis not present

## 2020-06-06 DIAGNOSIS — G9349 Other encephalopathy: Secondary | ICD-10-CM | POA: Diagnosis present

## 2020-06-06 DIAGNOSIS — T796XXD Traumatic ischemia of muscle, subsequent encounter: Secondary | ICD-10-CM

## 2020-06-06 DIAGNOSIS — F329 Major depressive disorder, single episode, unspecified: Secondary | ICD-10-CM | POA: Diagnosis present

## 2020-06-06 DIAGNOSIS — Z7982 Long term (current) use of aspirin: Secondary | ICD-10-CM | POA: Diagnosis not present

## 2020-06-06 DIAGNOSIS — W19XXXA Unspecified fall, initial encounter: Secondary | ICD-10-CM | POA: Diagnosis present

## 2020-06-06 DIAGNOSIS — Z20822 Contact with and (suspected) exposure to covid-19: Secondary | ICD-10-CM | POA: Diagnosis present

## 2020-06-06 DIAGNOSIS — R197 Diarrhea, unspecified: Secondary | ICD-10-CM | POA: Diagnosis present

## 2020-06-06 DIAGNOSIS — Y92009 Unspecified place in unspecified non-institutional (private) residence as the place of occurrence of the external cause: Secondary | ICD-10-CM | POA: Diagnosis not present

## 2020-06-06 DIAGNOSIS — Z87891 Personal history of nicotine dependence: Secondary | ICD-10-CM | POA: Diagnosis not present

## 2020-06-06 DIAGNOSIS — T796XXA Traumatic ischemia of muscle, initial encounter: Secondary | ICD-10-CM | POA: Diagnosis present

## 2020-06-06 DIAGNOSIS — Z87820 Personal history of traumatic brain injury: Secondary | ICD-10-CM | POA: Diagnosis not present

## 2020-06-06 LAB — BASIC METABOLIC PANEL
Anion gap: 8 (ref 5–15)
BUN: 18 mg/dL (ref 8–23)
CO2: 28 mmol/L (ref 22–32)
Calcium: 8.9 mg/dL (ref 8.9–10.3)
Chloride: 104 mmol/L (ref 98–111)
Creatinine, Ser: 0.6 mg/dL — ABNORMAL LOW (ref 0.61–1.24)
GFR calc Af Amer: >60 mL/min (ref >60)
GFR calc non Af Amer: >60 mL/min (ref >60)
Glucose, Bld: 108 mg/dL — ABNORMAL HIGH (ref 70–99)
Potassium: 3.8 mmol/L (ref 3.5–5.1)
Sodium: 140 mmol/L (ref 135–145)

## 2020-06-06 LAB — CBC
HCT: 38.2 % — ABNORMAL LOW (ref 39.0–52.0)
Hemoglobin: 12.8 g/dL — ABNORMAL LOW (ref 13.0–17.0)
MCH: 30.8 pg (ref 26.0–34.0)
MCHC: 33.5 g/dL (ref 30.0–36.0)
MCV: 91.8 fL (ref 80.0–100.0)
Platelets: 177 10*3/uL (ref 150–400)
RBC: 4.16 MIL/uL — ABNORMAL LOW (ref 4.22–5.81)
RDW: 13.4 % (ref 11.5–15.5)
WBC: 6 10*3/uL (ref 4.0–10.5)
nRBC: 0 % (ref 0.0–0.2)

## 2020-06-06 LAB — CK: Total CK: 227 U/L (ref 49–397)

## 2020-06-06 MED ORDER — CHLORDIAZEPOXIDE HCL 5 MG PO CAPS
10.0000 mg | ORAL_CAPSULE | ORAL | Status: AC
  Administered 2020-06-08 (×2): 10 mg via ORAL
  Filled 2020-06-06 (×2): qty 2

## 2020-06-06 MED ORDER — CHLORDIAZEPOXIDE HCL 5 MG PO CAPS
10.0000 mg | ORAL_CAPSULE | Freq: Four times a day (QID) | ORAL | Status: AC
  Administered 2020-06-06 (×3): 10 mg via ORAL
  Filled 2020-06-06 (×5): qty 2
  Filled 2020-06-06: qty 1

## 2020-06-06 MED ORDER — CHLORDIAZEPOXIDE HCL 5 MG PO CAPS
10.0000 mg | ORAL_CAPSULE | Freq: Three times a day (TID) | ORAL | Status: AC
  Administered 2020-06-07 (×3): 10 mg via ORAL
  Filled 2020-06-06 (×3): qty 2

## 2020-06-06 MED ORDER — CHLORDIAZEPOXIDE HCL 5 MG PO CAPS
10.0000 mg | ORAL_CAPSULE | Freq: Every day | ORAL | Status: AC
  Administered 2020-06-09: 10 mg via ORAL
  Filled 2020-06-06: qty 2

## 2020-06-06 NOTE — Consult Note (Signed)
Patient too somnolent on assessment to evaluate for capacity.  Waylan Boga, PMHNP

## 2020-06-06 NOTE — TOC Progression Note (Signed)
Transition of Care (TOC) - Progression Note    Patient Details  Name: Douglas Peterson. MRN: [id number] Date of Birth: 1951/03/14  [facility]) CM/SW Contact  Su Hilt, RN Phone Number: 06/06/2020, 2:07 PM  Clinical Narrative:    [name] currently going thru withdrawal and a capacity eval can't be completed as APS requested until he is clear from withdrawal        Expected Discharge Plan and Services                                                 Social Determinants of Health (SDOH) Interventions    Readmission Risk Interventions Readmission Risk Prevention Plan 05/19/2020 12/13/2019 12/12/2019  Transportation Screening Complete Complete Complete  PCP or Specialist Appt within 3-5 Days - (No Data) -  HRI or [place] - (No Data) -  Palliative Care Screening - Complete Complete  Medication Review ([place]) Complete Complete Complete  PCP or Specialist appointment within 3-5 days of discharge Complete - -  [place] or Home Care Consult Complete - -  SW Recovery Care/Counseling Consult Complete - -  Palliative Care Screening Not Applicable - -  [place] Not Applicable - -  Some recent data might be hidden

## 2020-06-06 NOTE — Progress Notes (Signed)
Cross Cover brief note Nurse reports patient on ETOH withdrawal protocol with prn ativan that is not controlling withdrawal symptoms. Patient started on librium

## 2020-06-06 NOTE — Progress Notes (Signed)
Patient has lost IV access at this time. This nurse has unsuccessful attempt to replace. Will consult IV team for assistance.

## 2020-06-06 NOTE — Progress Notes (Signed)
PROGRESS NOTE  [name] ZOX:[id number] DOB: 05/08/1951   PCP: [name], MD  [name] from: Home.  Uses cane at baseline.  Lives alone.  DOA: 06/05/2020 LOS: 0  Brief Narrative / Interim history: 69 year old male with history of HTN, GERD, depression, A. fib, alcohol abuse chronic right-sided weakness presenting with right shoulder pain after fall at home, and admitted for alcohol intoxication/withdrawal, rhabdomyolysis and right distal clavicular fracture.  CT head without acute finding.  Psychiatry consulted to assess for capacity to determine guardianship by APS.  Subjective: Seen and examined earlier this morning.  Patient had withdrawal symptoms not controlled by as needed Ativan.  Started on Librium overnight.  No complaint this morning.  He had some intention tremors in both extremities.  Denies audiovisual hallucination.  Complaints right shoulder pain.  Denies chest pain, dyspnea or GI symptoms.  Objective: Vitals:   06/06/20 0332 06/06/20 0816 06/06/20 1102 06/06/20 1200  BP: (!) 155/92 (!) 156/80 (!) 155/78 (!) 142/80  Pulse: 81 71 62 60  Resp: 18 16    Temp: 98 F (36.7 C) (!) 97.5 F (36.4 C)    TempSrc: Oral Oral    SpO2: 93% 97%      Intake/Output Summary (Last 24 hours) at 06/06/2020 1601 Last data filed at 06/06/2020 0334 Gross per 24 hour  Intake 3 ml  Output 100 ml  Net -97 ml   There were no vitals filed for this visit.  Examination:  GENERAL: Frail looking.  No distress. HEENT: MMM.  Vision and hearing grossly intact.  NECK: Supple.  No apparent JVD.  RESP: On RA.  No IWOB.  Fair aeration bilaterally. CVS:  RRR. Heart sounds normal.  ABD/GI/GU: BS+. Abd soft, NTND.  MSK/EXT:  Moves extremities.  RUE in a sling. SKIN: no apparent skin lesion or wound NEURO: Awake, alert and oriented appropriately.  No apparent focal neuro deficit. PSYCH: Calm. Normal affect.  Procedures:  None  Microbiology summarized: Augmentin PCR negative. C.  difficile PCR pending.  Assessment & Plan: Fall  at home-likely due to alcohol intoxication. Closed fracture of right distal clavicle:  -Supportive care with sling and pain control  -PT/OT  Alcoholic intoxication/withdrawal: Reports drinking about 18 cans of beer a day.  CIWA scores as high as 12 overnight.  Started on Librium in addition to as needed Ativan.  -Continue CIWA monitoring with as needed Ativan and Librium taper -Consult CM/SW -Encouraged cessation. -Vitamins.  Depression: Seems stable.  Denies SI/HI/audiovisual hallucination. -Continue home doxepin, Paxil  Traumatic rhabdomyolysis: CK 452> 227.  Likely due to alcohol and fall.  Improved.  -Continue IV fluid.  GERD (gastroesophageal reflux disease) -Protonix  Diarrhea?  Has history of C. Difficile.  No diarrhea here. -Follow C. difficile panel  Social issues: Lives alone.  Drinks heavily.  Found lying in fecal matters when EMS arrived. -APS following to determine guardianship  Severe protein-calorie malnutrition: As evidenced by significant muscle mass and subcu fat loss. -Nutrition supplement, Ensure -Consult dietitian. There is no height or weight on file to calculate BMI.       Pressure Injury 06/05/20 Coccyx Posterior;Medial Stage 1 -  Intact skin with non-blanchable redness of a localized area usually over a bony prominence. red intact dry skin (Active)  06/05/20 1902  Location: Coccyx  Location Orientation: Posterior;Medial  Staging: Stage 1 -  Intact skin with non-blanchable redness of a localized area usually over a bony prominence.  Wound Description (Comments): red intact dry skin  Present on Admission:  Yes   DVT prophylaxis:  enoxaparin (LOVENOX) injection 40 mg Start: 06/05/20 2200  Code Status: Full code Family Communication: None at bedside. Status is: Observation  The patient will require care spanning > 2 midnights and should be moved to inpatient because: Unsafe d/c plan, IV  treatments appropriate due to intensity of illness or inability to take PO and Inpatient level of care appropriate due to severity of illness  Dispo: The patient is from: Home              Anticipated d/c is to: To be determined.  Likely SNF.              Anticipated d/c date is: 2 days              Patient currently is not medically stable to d/c.       Consultants:  Psychiatry   Sch Meds:  Scheduled Meds: . ascorbic acid  1,000 mg Oral Daily  . aspirin EC  81 mg Oral Daily  . atenolol  25 mg Oral BID  . calcium-vitamin D  1 tablet Oral Q breakfast  . chlordiazePOXIDE  10 mg Oral QID   Followed by  . [START ON 06/07/2020] chlordiazePOXIDE  10 mg Oral TID   Followed by  . [START ON 06/08/2020] chlordiazePOXIDE  10 mg Oral BH-qamhs   Followed by  . [START ON 06/09/2020] chlordiazePOXIDE  10 mg Oral Daily  . cholecalciferol  5,000 Units Oral Daily  . enoxaparin (LOVENOX) injection  40 mg Subcutaneous Q24H  . feeding supplement (ENSURE ENLIVE)  237 mL Oral BID BM  . lidocaine  1 patch Transdermal Q24H  . LORazepam  0-4 mg Intravenous Q6H   Or  . LORazepam  0-4 mg Oral Q6H  . [START ON 06/07/2020] LORazepam  0-4 mg Intravenous Q12H   Or  . [START ON 06/07/2020] LORazepam  0-4 mg Oral Q12H  . multivitamin with minerals  1 tablet Oral Daily  . pantoprazole  40 mg Oral Daily  . PARoxetine  30 mg Oral Daily  . thiamine  100 mg Oral Daily   Or  . thiamine  100 mg Intravenous Daily   Continuous Infusions: . sodium chloride 100 mL/hr at 06/06/20 1107   PRN Meds:.acetaminophen, doxepin, hydrALAZINE, ibuprofen, methocarbamol, morphine injection, ondansetron (ZOFRAN) IV, oxyCODONE-acetaminophen  Antimicrobials: Anti-infectives (From admission, onward)   None       I have personally reviewed the following labs and images: CBC: Recent Labs  Lab 06/05/20 0204 06/06/20 0451  WBC 14.0* 6.0  NEUTROABS 4.8  --   HGB 14.3 12.8*  HCT 41.2 38.2*  MCV 88.4 91.8  PLT 252 177   BMP  &GFR Recent Labs  Lab 06/05/20 0204 06/05/20 0831 06/05/20 0915 06/06/20 0451  NA 129* 138  --  140  K 4.2 4.1  --  3.8  CL 91* 100  --  104  CO2 25 25  --  28  GLUCOSE 91 84  --  108*  BUN <5* <5*  --  18  CREATININE 0.51* 0.48*  --  0.60*  CALCIUM 8.4* 8.7*  --  8.9  MG  --   --  2.1  --   PHOS  --   --  2.9  --    Estimated Creatinine Clearance: 67.8 mL/min (A) (by C-G formula based on SCr of 0.6 mg/dL (L)). Liver & Pancreas: Recent Labs  Lab 06/05/20 0204  AST 52*  ALT 30  ALKPHOS 89  BILITOT 1.4*  PROT 7.8  ALBUMIN 4.5   Recent Labs  Lab 06/05/20 0204  LIPASE 29   Recent Labs  Lab 06/05/20 0242  AMMONIA 21   Diabetic: No results for input(s): HGBA1C in the last 72 hours. No results for input(s): GLUCAP in the last 168 hours. Cardiac Enzymes: Recent Labs  Lab 06/05/20 0204 06/05/20 0915 06/06/20 0451  CKTOTAL 568* 452* 227   No results for input(s): PROBNP in the last 8760 hours. Coagulation Profile: No results for input(s): INR, PROTIME in the last 168 hours. Thyroid Function Tests: No results for input(s): TSH, T4TOTAL, FREET4, T3FREE, THYROIDAB in the last 72 hours. Lipid Profile: No results for input(s): CHOL, HDL, LDLCALC, TRIG, CHOLHDL, LDLDIRECT in the last 72 hours. Anemia Panel: No results for input(s): VITAMINB12, FOLATE, FERRITIN, TIBC, IRON, RETICCTPCT in the last 72 hours. Urine analysis:    Component Value Date/Time   COLORURINE STRAW (A) 06/05/2020 0415   APPEARANCEUR CLEAR (A) 06/05/2020 0415   LABSPEC 1.001 (L) 06/05/2020 0415   PHURINE 6.0 06/05/2020 0415   GLUCOSEU NEGATIVE 06/05/2020 0415   HGBUR NEGATIVE 06/05/2020 0415   BILIRUBINUR NEGATIVE 06/05/2020 [place] 06/05/2020 0415   PROTEINUR NEGATIVE 06/05/2020 0415   NITRITE NEGATIVE 06/05/2020 0415   LEUKOCYTESUR NEGATIVE 06/05/2020 0415   Sepsis Labs: Invalid input(s): PROCALCITONIN, [place]  Microbiology: Recent Results (from the past 240  hour(s))  SARS Coronavirus 2 by RT PCR (hospital order, performed in [facility] hospital lab) Nasopharyngeal Nasopharyngeal Swab     Status: None   Collection Time: 06/05/20 11:38 AM   Specimen: Nasopharyngeal Swab  Result Value Ref Range Status   SARS Coronavirus 2 NEGATIVE NEGATIVE Final    Comment: (NOTE) SARS-CoV-2 target nucleic acids are NOT DETECTED.  The SARS-CoV-2 RNA is generally detectable in upper and lower respiratory specimens during the acute phase of infection. The lowest concentration of SARS-CoV-2 viral copies this assay can detect is 250 copies / mL. A negative result does not preclude SARS-CoV-2 infection and should not be used as the sole basis for treatment or other patient management decisions.  A negative result may occur with improper specimen collection / handling, submission of specimen other than nasopharyngeal swab, presence of viral mutation(s) within the areas targeted by this assay, and inadequate number of viral copies (<250 copies / mL). A negative result must be combined with clinical observations, patient history, and epidemiological information.  Fact Sheet for Patients:   StrictlyIdeas.no  Fact Sheet for Healthcare Providers: BankingDealers.co.za  This test is not yet approved or  cleared by the [place] FDA and has been authorized for detection and/or diagnosis of SARS-CoV-2 by FDA under an Emergency Use Authorization (EUA).  This EUA will remain in effect (meaning this test can be used) for the duration of the COVID-19 declaration under Section 564(b)(1) of the Act, 21 U.S.C. section 360bbb-3(b)(1), unless the authorization is terminated or revoked sooner.  Performed at [facility], [address] [place], [place] [address]     Radiology Studies: CT HEAD WO CONTRAST  Result Date: 06/05/2020 CLINICAL DATA:  69 year old male with head trauma. EXAM: CT HEAD WITHOUT CONTRAST  TECHNIQUE: Contiguous axial images were obtained from the base of the skull through the vertex without intravenous contrast. COMPARISON:  Head CT dated 03/17/2020. FINDINGS: Brain: The ventricles and sulci appropriate size for patient's age. Minimal periventricular and deep white matter chronic microvascular ischemic changes noted. There is no acute intracranial hemorrhage. No mass effect or midline  shift. No extra-axial fluid collection. Vascular: No hyperdense vessel or unexpected calcification. Skull: Normal. Negative for fracture or focal lesion. Sinuses/Orbits: No acute finding. Other: None IMPRESSION: No acute intracranial pathology. Electronically Signed   By: Anner Crete M.D.   On: 06/05/2020 16:21       T. [place]  If 7PM-7AM, please contact night-coverage [email or web address] Password [email or web address] 06/06/2020, 4:01 PM

## 2020-06-07 ENCOUNTER — Inpatient Hospital Stay: Payer: Medicare HMO

## 2020-06-07 DIAGNOSIS — F10929 Alcohol use, unspecified with intoxication, unspecified: Secondary | ICD-10-CM | POA: Diagnosis not present

## 2020-06-07 DIAGNOSIS — F10230 Alcohol dependence with withdrawal, uncomplicated: Secondary | ICD-10-CM | POA: Diagnosis not present

## 2020-06-07 DIAGNOSIS — S42031A Displaced fracture of lateral end of right clavicle, initial encounter for closed fracture: Secondary | ICD-10-CM | POA: Diagnosis not present

## 2020-06-07 DIAGNOSIS — W19XXXA Unspecified fall, initial encounter: Secondary | ICD-10-CM | POA: Diagnosis not present

## 2020-06-07 LAB — CBC
HCT: 36.5 % — ABNORMAL LOW (ref 39.0–52.0)
Hemoglobin: 12.3 g/dL — ABNORMAL LOW (ref 13.0–17.0)
MCH: 30.5 pg (ref 26.0–34.0)
MCHC: 33.7 g/dL (ref 30.0–36.0)
MCV: 90.6 fL (ref 80.0–100.0)
Platelets: 145 10*3/uL — ABNORMAL LOW (ref 150–400)
RBC: 4.03 MIL/uL — ABNORMAL LOW (ref 4.22–5.81)
RDW: 13.3 % (ref 11.5–15.5)
WBC: 5.5 10*3/uL (ref 4.0–10.5)
nRBC: 0 % (ref 0.0–0.2)

## 2020-06-07 LAB — COMPREHENSIVE METABOLIC PANEL
ALT: 22 U/L (ref 0–44)
AST: 26 U/L (ref 15–41)
Albumin: 3.4 g/dL — ABNORMAL LOW (ref 3.5–5.0)
Alkaline Phosphatase: 69 U/L (ref 38–126)
Anion gap: 8 (ref 5–15)
BUN: 10 mg/dL (ref 8–23)
CO2: 24 mmol/L (ref 22–32)
Calcium: 8.5 mg/dL — ABNORMAL LOW (ref 8.9–10.3)
Chloride: 108 mmol/L (ref 98–111)
Creatinine, Ser: 0.5 mg/dL — ABNORMAL LOW (ref 0.61–1.24)
GFR calc Af Amer: >60 mL/min (ref >60)
GFR calc non Af Amer: >60 mL/min (ref >60)
Glucose, Bld: 92 mg/dL (ref 70–99)
Potassium: 3.7 mmol/L (ref 3.5–5.1)
Sodium: 140 mmol/L (ref 135–145)
Total Bilirubin: 0.9 mg/dL (ref 0.3–1.2)
Total Protein: 6 g/dL — ABNORMAL LOW (ref 6.5–8.1)

## 2020-06-07 LAB — BRAIN NATRIURETIC PEPTIDE: B Natriuretic Peptide: 133.2 pg/mL — ABNORMAL HIGH (ref 0.0–100.0)

## 2020-06-07 LAB — PHOSPHORUS: Phosphorus: 3.5 mg/dL (ref 2.5–4.6)

## 2020-06-07 LAB — MAGNESIUM: Magnesium: 1.6 mg/dL — ABNORMAL LOW (ref 1.7–2.4)

## 2020-06-07 MED ORDER — MAGNESIUM SULFATE 2 GM/50ML IV SOLN
2.0000 g | Freq: Once | INTRAVENOUS | Status: AC
  Administered 2020-06-07: 2 g via INTRAVENOUS
  Filled 2020-06-07: qty 50

## 2020-06-07 MED ORDER — LORAZEPAM 2 MG PO TABS
0.0000 mg | ORAL_TABLET | Freq: Four times a day (QID) | ORAL | Status: AC
  Administered 2020-06-07: 4 mg via ORAL
  Administered 2020-06-07: 2 mg via ORAL
  Administered 2020-06-08: 4 mg via ORAL
  Administered 2020-06-08 (×2): 2 mg via ORAL
  Administered 2020-06-09 (×2): 1 mg via ORAL
  Filled 2020-06-07: qty 1
  Filled 2020-06-07: qty 2
  Filled 2020-06-07 (×4): qty 1
  Filled 2020-06-07 (×2): qty 2

## 2020-06-07 MED ORDER — LORAZEPAM 2 MG/ML IJ SOLN
0.0000 mg | Freq: Four times a day (QID) | INTRAMUSCULAR | Status: AC
  Filled 2020-06-07: qty 1

## 2020-06-07 MED ORDER — FUROSEMIDE 10 MG/ML IJ SOLN
60.0000 mg | Freq: Once | INTRAMUSCULAR | Status: DC
  Filled 2020-06-07: qty 8

## 2020-06-07 MED ORDER — FOLIC ACID 1 MG PO TABS
1.0000 mg | ORAL_TABLET | Freq: Every day | ORAL | Status: DC
  Administered 2020-06-08 – 2020-06-26 (×19): 1 mg via ORAL
  Filled 2020-06-07 (×19): qty 1
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
## 2020-06-07 NOTE — Progress Notes (Signed)
Rapid response called on pt for possible aspiration and desat into 80's. Pt placed on 2L [place]. manual CPT performed with no results, Flutter given to pt with instructions on use. Pt stated he was feeling a little better.

## 2020-06-07 NOTE — Progress Notes (Signed)
   06/07/20 1700  CIWA-Ar  Nausea and Vomiting 0  Tactile Disturbances 2  Tremor 7  Auditory Disturbances 2  Paroxysmal Sweats 4  Visual Disturbances 2  Anxiety 1  Headache, Fullness in Head 0  Agitation 0  Orientation and Clouding of Sensorium 0  CIWA-Ar Total 18

## 2020-06-07 NOTE — Progress Notes (Signed)
Given PRN ativan 68m PO for sever tremor , anxiety and sweating

## 2020-06-07 NOTE — Progress Notes (Signed)
This writer walked in patients room , he was up in recliner, blood noted to paper chuck , noted to have abrasion to the right scapula, placed xerform and mepilex placed. Notified Dr. Cyndia Skeeters. Bed alarm was cut off, this writer turned it on , given librium and morphine for pain . Tremors noted to BUE, BLE

## 2020-06-07 NOTE — Significant Event (Signed)
Rapid Response Event Note   Reason for Call :  Per bedside RN- patient had aspirated on Ensure drink and oxygen sats dropped to 80's.  Initial Focused Assessment:   Patient alert and oriented- vitals stable- pt placed on 2 liters [place]-  Patient was suctioned-    Interventions:  MD notified plan for chest xray order.  Plan of Care:     Event Summary:   MD Notified:  Call Time: Arrival Time: End Time:  Penne Lash, RN
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
## 2020-06-07 NOTE — Progress Notes (Signed)
PROGRESS NOTE  Douglas Peterson. OMB:[id number] DOB: 1951-09-06   PCP: [name], MD  [name] from: Home.  Uses cane at baseline.  Lives alone.  DOA: 06/05/2020 LOS: 1  Brief Narrative / Interim history: 69 year old male with history of HTN, GERD, depression, A. fib, alcohol abuse chronic right-sided weakness presenting with right shoulder pain after fall at home, and admitted for alcohol intoxication/withdrawal, rhabdomyolysis and right distal clavicular fracture.  CT head without acute finding.  Psychiatry consulted to assess for capacity to determine guardianship by APS.  Subjective: Seen and examined earlier this morning.  No major events overnight of this morning.  Right shoulder pain better today.  Denies chest pain, dyspnea, GI or UTI symptoms.  Objective: Vitals:   06/07/20 0023 06/07/20 0415 06/07/20 0805 06/07/20 1204  BP: 140/78 (!) 157/75 (!) 176/97 (!) 159/84  Pulse: 77 65 72 72  Resp: 20 16 16 19   Temp: 98 F (36.7 C) 98.1 F (36.7 C) 98.4 F (36.9 C) 98 F (36.7 C)  TempSrc: Oral  Oral Oral  SpO2: 95% 97% 95% 97%    Intake/Output Summary (Last 24 hours) at 06/07/2020 1429 Last data filed at 06/07/2020 1300 Gross per 24 hour  Intake 2038.37 ml  Output 855 ml  Net 1183.37 ml   There were no vitals filed for this visit.  Examination:  GENERAL: Frail looking.  No apparent distress. HEENT: MMM.  Vision and hearing grossly intact.  NECK: Supple.  No apparent JVD.  RESP: RA.  No IWOB.  Fair aeration bilaterally. CVS:  RRR. Heart sounds normal.  ABD/GI/GU: BS+. Abd soft, NTND.  MSK/EXT:  Moves extremities. No apparent deformity. No edema.  SKIN: no apparent skin lesion or wound NEURO: Awake, alert and oriented appropriately.  Coarse intention tremor and left hand PSYCH: Calm. Normal affect.  Procedures:  None  Microbiology summarized: Augmentin PCR negative. C. difficile PCR pending.  Assessment & Plan: Fall  at home-likely due to alcohol  intoxication. Closed fracture of right distal clavicle:  -Supportive care with sling and pain control  -PT/OT  Alcoholic intoxication/withdrawal: Reports drinking about 18 cans of beer a day.  -Continue CIWA monitoring with as needed Ativan and Librium taper -Consult CM/SW -Encouraged cessation. -Vitamins and folic acid.  Hypomagnesemia: Likely due to alcohol. -Replenish and recheck  Depression: Seems stable.  Denies SI/HI/audiovisual hallucination. -Continue home doxepin, Paxil  Traumatic rhabdomyolysis: CK 452> 227.  Likely due to alcohol and fall.  Improved.  -Continue IV fluid.  GERD (gastroesophageal reflux disease) -Protonix  Diarrhea?  Has history of C. Difficile.  No diarrhea here. -DC enteric precautions  Mild thrombocytopenia: Likely due to alcohol. -Continue monitoring  Social issues: Lives alone.  Drinks heavily.  Found lying in fecal matters when EMS arrived. -APS following to determine guardianship  Debility/physical deconditioning -PT/OT  Severe protein-calorie malnutrition: As evidenced by significant muscle mass and subcu fat loss. -Nutrition supplement, Ensure -Appreciate input by dietitian. There is no height or weight on file to calculate BMI. Nutrition Problem: Severe Malnutrition Etiology: social / environmental circumstances (EtOH abuse) Signs/Symptoms: severe fat depletion, moderate muscle depletion, severe muscle depletion Interventions: Refer to RD note for recommendations   Pressure skin injury Pressure Injury 06/05/20 Coccyx Posterior;Medial Stage 1 -  Intact skin with non-blanchable redness of a localized area usually over a bony prominence. red intact dry skin (Active)  06/05/20 1902  Location: Coccyx  Location Orientation: Posterior;Medial  Staging: Stage 1 -  Intact skin with non-blanchable redness of a localized area  usually over a bony prominence.  Wound Description (Comments): red intact dry skin  Present on Admission: Yes    DVT prophylaxis:  enoxaparin (LOVENOX) injection 40 mg Start: 06/05/20 2200  Code Status: Full code Family Communication: None at bedside. Status is: Inpatient  Remains inpatient appropriate because:Unsafe d/c plan, IV treatments appropriate due to intensity of illness or inability to take PO, Inpatient level of care appropriate due to severity of illness and Ongoing alcohol withdrawal   Dispo: The patient is from: Home              Anticipated d/c is to: SNF              Anticipated d/c date is: 1 day              Patient currently is not medically stable to d/c.            Consultants:  Psychiatry   Sch Meds:  Scheduled Meds:  ascorbic acid  1,000 mg Oral Daily   aspirin EC  81 mg Oral Daily   atenolol  25 mg Oral BID   calcium-vitamin D  1 tablet Oral Q breakfast   chlordiazePOXIDE  10 mg Oral TID   Followed by   Derrill Memo ON 06/08/2020] chlordiazePOXIDE  10 mg Oral BH-qamhs   Followed by   Derrill Memo ON 06/09/2020] chlordiazePOXIDE  10 mg Oral Daily   cholecalciferol  5,000 Units Oral Daily   enoxaparin (LOVENOX) injection  40 mg Subcutaneous Q24H   feeding supplement (ENSURE ENLIVE)  237 mL Oral BID BM   folic acid  1 mg Oral Daily   lidocaine  1 patch Transdermal Q24H   LORazepam  0-4 mg Intravenous Q12H   Or   LORazepam  0-4 mg Oral Q12H   multivitamin with minerals  1 tablet Oral Daily   pantoprazole  40 mg Oral Daily   PARoxetine  30 mg Oral Daily   thiamine  100 mg Oral Daily   Or   thiamine  100 mg Intravenous Daily   Continuous Infusions:  sodium chloride 100 mL/hr at 06/07/20 1232   PRN Meds:.acetaminophen, doxepin, hydrALAZINE, ibuprofen, methocarbamol, morphine injection, ondansetron (ZOFRAN) IV, oxyCODONE-acetaminophen  Antimicrobials: Anti-infectives (From admission, onward)   None       I have personally reviewed the following labs and images: CBC: Recent Labs  Lab 06/05/20 0204 06/06/20 0451 06/07/20 0626  WBC  14.0* 6.0 5.5  NEUTROABS 4.8  --   --   HGB 14.3 12.8* 12.3*  HCT 41.2 38.2* 36.5*  MCV 88.4 91.8 90.6  PLT 252 177 145*   BMP &GFR Recent Labs  Lab 06/05/20 0204 06/05/20 0831 06/05/20 0915 06/06/20 0451 06/07/20 0626  NA 129* 138  --  140 140  K 4.2 4.1  --  3.8 3.7  CL 91* 100  --  104 108  CO2 25 25  --  28 24  GLUCOSE 91 84  --  108* 92  BUN <5* <5*  --  18 10  CREATININE 0.51* 0.48*  --  0.60* 0.50*  CALCIUM 8.4* 8.7*  --  8.9 8.5*  MG  --   --  2.1  --  1.6*  PHOS  --   --  2.9  --  3.5   CrCl cannot be calculated (Unknown ideal weight.). Liver & Pancreas: Recent Labs  Lab 06/05/20 0204 06/07/20 0626  AST 52* 26  ALT 30 22  ALKPHOS 89 69  BILITOT 1.4* 0.9  PROT  7.8 6.0*  ALBUMIN 4.5 3.4*   Recent Labs  Lab 06/05/20 0204  LIPASE 29   Recent Labs  Lab 06/05/20 0242  AMMONIA 21   Diabetic: No results for input(s): HGBA1C in the last 72 hours. No results for input(s): GLUCAP in the last 168 hours. Cardiac Enzymes: Recent Labs  Lab 06/05/20 0204 06/05/20 0915 06/06/20 0451  CKTOTAL 568* 452* 227   No results for input(s): PROBNP in the last 8760 hours. Coagulation Profile: No results for input(s): INR, PROTIME in the last 168 hours. Thyroid Function Tests: No results for input(s): TSH, T4TOTAL, FREET4, T3FREE, THYROIDAB in the last 72 hours. Lipid Profile: No results for input(s): CHOL, HDL, LDLCALC, TRIG, CHOLHDL, LDLDIRECT in the last 72 hours. Anemia Panel: No results for input(s): VITAMINB12, FOLATE, FERRITIN, TIBC, IRON, RETICCTPCT in the last 72 hours. Urine analysis:    Component Value Date/Time   COLORURINE STRAW (A) 06/05/2020 0415   APPEARANCEUR CLEAR (A) 06/05/2020 0415   LABSPEC 1.001 (L) 06/05/2020 0415   PHURINE 6.0 06/05/2020 0415   GLUCOSEU NEGATIVE 06/05/2020 0415   HGBUR NEGATIVE 06/05/2020 0415   BILIRUBINUR NEGATIVE 06/05/2020 [place] 06/05/2020 0415   PROTEINUR NEGATIVE 06/05/2020 0415   NITRITE  NEGATIVE 06/05/2020 0415   LEUKOCYTESUR NEGATIVE 06/05/2020 0415   Sepsis Labs: Invalid input(s): PROCALCITONIN, [place]  Microbiology: Recent Results (from the past 240 hour(s))  SARS Coronavirus 2 by RT PCR (hospital order, performed in [facility] hospital lab) Nasopharyngeal Nasopharyngeal Swab     Status: None   Collection Time: 06/05/20 11:38 AM   Specimen: Nasopharyngeal Swab  Result Value Ref Range Status   SARS Coronavirus 2 NEGATIVE NEGATIVE Final    Comment: (NOTE) SARS-CoV-2 target nucleic acids are NOT DETECTED.  The SARS-CoV-2 RNA is generally detectable in upper and lower respiratory specimens during the acute phase of infection. The lowest concentration of SARS-CoV-2 viral copies this assay can detect is 250 copies / mL. A negative result does not preclude SARS-CoV-2 infection and should not be used as the sole basis for treatment or other patient management decisions.  A negative result may occur with improper specimen collection / handling, submission of specimen other than nasopharyngeal swab, presence of viral mutation(s) within the areas targeted by this assay, and inadequate number of viral copies (<250 copies / mL). A negative result must be combined with clinical observations, patient history, and epidemiological information.  Fact Sheet for Patients:   StrictlyIdeas.no  Fact Sheet for Healthcare Providers: BankingDealers.co.za  This test is not yet approved or  cleared by the [place] FDA and has been authorized for detection and/or diagnosis of SARS-CoV-2 by FDA under an Emergency Use Authorization (EUA).  This EUA will remain in effect (meaning this test can be used) for the duration of the COVID-19 declaration under Section 564(b)(1) of the Act, 21 U.S.C. section 360bbb-3(b)(1), unless the authorization is terminated or revoked sooner.  Performed at [facility], [address] [place], [place] [address]     Radiology Studies: No results found.     T. [place]  If 7PM-7AM, please contact night-coverage [email or web address] Password [facility] 06/07/2020, 2:29 PM

## 2020-06-07 NOTE — Consult Note (Signed)
Attempted to see the patient, rapid response in place.  Psychiatry will attempt to see him tomorrow.  Waylan Boga, PMHNP

## 2020-06-07 NOTE — Progress Notes (Signed)
Patient noted to be  Coughing and o2 was 87% . We placed on 2l called Rapid Response. RT assessed , Dr. Cyndia Skeeters notified new order for CXR

## 2020-06-07 NOTE — Progress Notes (Signed)
Patient IV to LFA infiltrated, IV team attempt to start IV unsuccessful . Patient Left Arm is swollen and right arm is limited d/t being in a sling. Notified Dr. Cyndia Skeeters.

## 2020-06-08 DIAGNOSIS — S42031A Displaced fracture of lateral end of right clavicle, initial encounter for closed fracture: Secondary | ICD-10-CM | POA: Diagnosis not present

## 2020-06-08 DIAGNOSIS — W19XXXA Unspecified fall, initial encounter: Secondary | ICD-10-CM | POA: Diagnosis not present

## 2020-06-08 DIAGNOSIS — R1310 Dysphagia, unspecified: Secondary | ICD-10-CM

## 2020-06-08 DIAGNOSIS — F10230 Alcohol dependence with withdrawal, uncomplicated: Secondary | ICD-10-CM | POA: Diagnosis not present

## 2020-06-08 DIAGNOSIS — F10929 Alcohol use, unspecified with intoxication, unspecified: Secondary | ICD-10-CM | POA: Diagnosis not present

## 2020-06-08 LAB — RENAL FUNCTION PANEL
Albumin: 3.8 g/dL (ref 3.5–5.0)
Anion gap: 9 (ref 5–15)
BUN: 10 mg/dL (ref 8–23)
CO2: 28 mmol/L (ref 22–32)
Calcium: 8.8 mg/dL — ABNORMAL LOW (ref 8.9–10.3)
Chloride: 100 mmol/L (ref 98–111)
Creatinine, Ser: 0.5 mg/dL — ABNORMAL LOW (ref 0.61–1.24)
GFR calc Af Amer: >60 mL/min (ref >60)
GFR calc non Af Amer: >60 mL/min (ref >60)
Glucose, Bld: 88 mg/dL (ref 70–99)
Phosphorus: 2.8 mg/dL (ref 2.5–4.6)
Potassium: 3.6 mmol/L (ref 3.5–5.1)
Sodium: 137 mmol/L (ref 135–145)

## 2020-06-08 LAB — MAGNESIUM: Magnesium: 1.8 mg/dL (ref 1.7–2.4)

## 2020-06-08 MED ORDER — SENNOSIDES-DOCUSATE SODIUM 8.6-50 MG PO TABS
1.0000 | ORAL_TABLET | Freq: Two times a day (BID) | ORAL | Status: DC | PRN
  Administered 2020-06-08 – 2020-06-20 (×2): 1 via ORAL
  Filled 2020-06-08 (×2): qty 1

## 2020-06-08 MED ORDER — POLYETHYLENE GLYCOL 3350 17 G PO PACK
17.0000 g | PACK | Freq: Two times a day (BID) | ORAL | Status: DC | PRN
  Administered 2020-06-11 – 2020-06-20 (×3): 17 g via ORAL
  Filled 2020-06-08 (×4): qty 1

## 2020-06-08 MED ORDER — NEPRO/CARBSTEADY PO LIQD
237.0000 mL | Freq: Three times a day (TID) | ORAL | Status: DC
  Administered 2020-06-08 – 2020-06-21 (×32): 237 mL via ORAL

## 2020-06-08 NOTE — Progress Notes (Signed)
°   06/07/20 0330  Clinical Encounter Type  Visited With Patient  Visit Type Initial;Spiritual support;Social support  Referral From Nurse  Consult/Referral To Chaplain  Ch responded to PG for RR. When I arrived everything was uncontrol. Ch will follow-up with Pt.

## 2020-06-08 NOTE — Progress Notes (Signed)
Patient tremoring , and sweating, trying to get OOB without assistance given PRN lorazepam
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
## 2020-06-08 NOTE — Progress Notes (Signed)
PROGRESS NOTE  Douglas Peterson. WVP:[id number] DOB: 12-Jun-1951   PCP: [name], MD  [name] from: Home.  Uses cane at baseline.  Lives alone.  DOA: 06/05/2020 LOS: 2  Brief Narrative / Interim history: 69 year old male with history of HTN, GERD, depression, A. fib, alcohol abuse chronic right-sided weakness presenting with right shoulder pain after fall at home, and admitted for alcohol intoxication/withdrawal, rhabdomyolysis and right distal clavicular fracture.  CT head without acute finding.  Psychiatry consulted to assess for capacity to determine guardianship by APS.  Subjective: Seen and examined earlier this morning.  Patient had an episode of respiratory distress yesterday afternoon.  There was concern about aspiration.  However, breathing improved.  Chest x-ray without significant finding.  He is alert and awake but somewhat confused.  He is only oriented to self today.  Responds no to chest pain, dyspnea, GI or UTI symptoms.  Objective: Vitals:   06/07/20 1204 06/07/20 1524 06/08/20 0055 06/08/20 0724  BP: (!) 159/84 (!) 168/83 (!) 160/94 137/63  Pulse: 72 72 76 (!) 57  Resp: 19 16  15   Temp: 98 F (36.7 C) 98.1 F (36.7 C) (!) 97.5 F (36.4 C) 98.4 F (36.9 C)  TempSrc: Oral Oral  Oral  SpO2: 97% 94% 94% 98%    Intake/Output Summary (Last 24 hours) at 06/08/2020 1235 Last data filed at 06/08/2020 1104 Gross per 24 hour  Intake 240 ml  Output 450 ml  Net -210 ml   There were no vitals filed for this visit.  Examination:  GENERAL: Frail looking elderly male.  Nontoxic. HEENT: MMM.  Vision and hearing grossly intact.  NECK: Supple.  No apparent JVD.  RESP: On RA.  No IWOB.  Fair aeration bilaterally. CVS:  RRR. Heart sounds normal.  ABD/GI/GU: BS+. Abd soft, NTND.  MSK/EXT:  Moves extremities. No apparent deformity. No edema.  SKIN: no apparent skin lesion or wound NEURO: Awake, alert and oriented appropriately.  Coarse intention tremor in left arm. PSYCH:  Calm. Normal affect.  Procedures:  None  Microbiology summarized: COVID-19 PCR negative. C. difficile PCR pending.  Assessment & Plan: Fall  at home-likely due to alcohol intoxication. Closed fracture of right distal clavicle:  -Supportive care with sling and pain control  -PT/OT  Alcoholic intoxication/withdrawal: Reports drinking about 18 cans of beer a day.  -Continue CIWA monitoring with as needed Ativan and Librium taper -Consult CM/SW -Encouraged cessation. -Vitamins and folic acid.  Transient respiratory distress: Resolved.  Concern about aspiration.  CXR without acute finding.  BNP slightly elevated to 133.  Does not appear fluid overloaded. -Aspiration precautions -SLP eval  Dysphagia: Some concern about aspiration on 8/5. -Aspiration precautions and SLP eval  Hypomagnesemia: Likely due to alcohol. -Replenish and recheck  Depression: Seems stable.  Denies SI/HI/audiovisual hallucination. -Continue home doxepin, Paxil  Traumatic rhabdomyolysis: CK 452> 227.  Likely due to alcohol and fall.  Improved.  -Continue IV fluid.  GERD (gastroesophageal reflux disease) -Protonix  Diarrhea?  has history of C. Difficile.  No diarrhea here. -DC enteric precautions  Mild thrombocytopenia: Likely due to alcohol. -Continue monitoring  Social issues: Lives alone.  Drinks heavily.  Found lying in fecal matters when EMS arrived. -APS following to determine guardianship  Debility/physical deconditioning -PT/OT  Severe protein-calorie malnutrition: As evidenced by significant muscle mass and subcu fat loss. -Nutrition supplement, Ensure -Appreciate input by dietitian. There is no height or weight on file to calculate BMI. Nutrition Problem: Severe Malnutrition Etiology: social / environmental circumstances (  EtOH abuse) Signs/Symptoms: severe fat depletion, moderate muscle depletion, severe muscle depletion Interventions: Refer to RD note for recommendations    Pressure skin injury Pressure Injury 06/05/20 Coccyx Posterior;Medial Stage 1 -  Intact skin with non-blanchable redness of a localized area usually over a bony prominence. red intact dry skin (Active)  06/05/20 1902  Location: Coccyx  Location Orientation: Posterior;Medial  Staging: Stage 1 -  Intact skin with non-blanchable redness of a localized area usually over a bony prominence.  Wound Description (Comments): red intact dry skin  Present on Admission: Yes   DVT prophylaxis:  enoxaparin (LOVENOX) injection 40 mg Start: 06/05/20 2200  Code Status: Full code Family Communication: None at bedside. Status is: Inpatient  Remains inpatient appropriate because:Unsafe d/c plan, IV treatments appropriate due to intensity of illness or inability to take PO, Inpatient level of care appropriate due to severity of illness and Ongoing alcohol withdrawal   Dispo: The patient is from: Home              Anticipated d/c is to: SNF              Anticipated d/c date is: 1 day              Patient currently is not medically stable to d/c.            Consultants:  Psychiatry   Sch Meds:  Scheduled Meds: . ascorbic acid  1,000 mg Oral Daily  . aspirin EC  81 mg Oral Daily  . atenolol  25 mg Oral BID  . calcium-vitamin D  1 tablet Oral Q breakfast  . chlordiazePOXIDE  10 mg Oral BH-qamhs   Followed by  . [START ON 06/09/2020] chlordiazePOXIDE  10 mg Oral Daily  . cholecalciferol  5,000 Units Oral Daily  . enoxaparin (LOVENOX) injection  40 mg Subcutaneous Q24H  . feeding supplement (NEPRO CARB STEADY)  237 mL Oral TID BM  . folic acid  1 mg Oral Daily  . lidocaine  1 patch Transdermal Q24H  . LORazepam  0-4 mg Intravenous Q6H   Or  . LORazepam  0-4 mg Oral Q6H  . multivitamin with minerals  1 tablet Oral Daily  . pantoprazole  40 mg Oral Daily  . PARoxetine  30 mg Oral Daily  . thiamine  100 mg Oral Daily   Or  . thiamine  100 mg Intravenous Daily   Continuous  Infusions:  PRN Meds:.acetaminophen, doxepin, hydrALAZINE, ibuprofen, methocarbamol, morphine injection, ondansetron (ZOFRAN) IV, oxyCODONE-acetaminophen  Antimicrobials: Anti-infectives (From admission, onward)   None       I have personally reviewed the following labs and images: CBC: Recent Labs  Lab 06/05/20 0204 06/06/20 0451 06/07/20 0626  WBC 14.0* 6.0 5.5  NEUTROABS 4.8  --   --   HGB 14.3 12.8* 12.3*  HCT 41.2 38.2* 36.5*  MCV 88.4 91.8 90.6  PLT 252 177 145*   BMP &GFR Recent Labs  Lab 06/05/20 0204 06/05/20 0831 06/05/20 0915 06/06/20 0451 06/07/20 0626 06/08/20 0609  NA 129* 138  --  140 140 137  K 4.2 4.1  --  3.8 3.7 3.6  CL 91* 100  --  104 108 100  CO2 25 25  --  28 24 28   GLUCOSE 91 84  --  108* 92 88  BUN <5* <5*  --  18 10 10   CREATININE 0.51* 0.48*  --  0.60* 0.50* 0.50*  CALCIUM 8.4* 8.7*  --  8.9 8.5* 8.8*  MG  --   --  2.1  --  1.6* 1.8  PHOS  --   --  2.9  --  3.5 2.8   CrCl cannot be calculated (Unknown ideal weight.). Liver & Pancreas: Recent Labs  Lab 06/05/20 0204 06/07/20 0626 06/08/20 0609  AST 52* 26  --   ALT 30 22  --   ALKPHOS 89 69  --   BILITOT 1.4* 0.9  --   PROT 7.8 6.0*  --   ALBUMIN 4.5 3.4* 3.8   Recent Labs  Lab 06/05/20 0204  LIPASE 29   Recent Labs  Lab 06/05/20 0242  AMMONIA 21   Diabetic: No results for input(s): HGBA1C in the last 72 hours. No results for input(s): GLUCAP in the last 168 hours. Cardiac Enzymes: Recent Labs  Lab 06/05/20 0204 06/05/20 0915 06/06/20 0451  CKTOTAL 568* 452* 227   No results for input(s): PROBNP in the last 8760 hours. Coagulation Profile: No results for input(s): INR, PROTIME in the last 168 hours. Thyroid Function Tests: No results for input(s): TSH, T4TOTAL, FREET4, T3FREE, THYROIDAB in the last 72 hours. Lipid Profile: No results for input(s): CHOL, HDL, LDLCALC, TRIG, CHOLHDL, LDLDIRECT in the last 72 hours. Anemia Panel: No results for input(s):  VITAMINB12, FOLATE, FERRITIN, TIBC, IRON, RETICCTPCT in the last 72 hours. Urine analysis:    Component Value Date/Time   COLORURINE STRAW (A) 06/05/2020 0415   APPEARANCEUR CLEAR (A) 06/05/2020 0415   LABSPEC 1.001 (L) 06/05/2020 0415   PHURINE 6.0 06/05/2020 0415   GLUCOSEU NEGATIVE 06/05/2020 0415   HGBUR NEGATIVE 06/05/2020 0415   BILIRUBINUR NEGATIVE 06/05/2020 [place] 06/05/2020 0415   PROTEINUR NEGATIVE 06/05/2020 0415   NITRITE NEGATIVE 06/05/2020 0415   LEUKOCYTESUR NEGATIVE 06/05/2020 0415   Sepsis Labs: Invalid input(s): PROCALCITONIN, [place]  Microbiology: Recent Results (from the past 240 hour(s))  SARS Coronavirus 2 by RT PCR (hospital order, performed in [facility] hospital lab) Nasopharyngeal Nasopharyngeal Swab     Status: None   Collection Time: 06/05/20 11:38 AM   Specimen: Nasopharyngeal Swab  Result Value Ref Range Status   SARS Coronavirus 2 NEGATIVE NEGATIVE Final    Comment: (NOTE) SARS-CoV-2 target nucleic acids are NOT DETECTED.  The SARS-CoV-2 RNA is generally detectable in upper and lower respiratory specimens during the acute phase of infection. The lowest concentration of SARS-CoV-2 viral copies this assay can detect is 250 copies / mL. A negative result does not preclude SARS-CoV-2 infection and should not be used as the sole basis for treatment or other patient management decisions.  A negative result may occur with improper specimen collection / handling, submission of specimen other than nasopharyngeal swab, presence of viral mutation(s) within the areas targeted by this assay, and inadequate number of viral copies (<250 copies / mL). A negative result must be combined with clinical observations, patient history, and epidemiological information.  Fact Sheet for Patients:   StrictlyIdeas.no  Fact Sheet for Healthcare Providers: BankingDealers.co.za  This test is not  yet approved or  cleared by the [place] FDA and has been authorized for detection and/or diagnosis of SARS-CoV-2 by FDA under an Emergency Use Authorization (EUA).  This EUA will remain in effect (meaning this test can be used) for the duration of the COVID-19 declaration under Section 564(b)(1) of the Act, 21 U.S.C. section 360bbb-3(b)(1), unless the authorization is terminated or revoked sooner.  Performed at [facility], [address], [place], [place] [address]  Radiology Studies: DG Chest Port 1 View  Result Date: 06/07/2020 CLINICAL DATA:  Shortness of breath. EXAM: PORTABLE CHEST 1 VIEW COMPARISON:  June 05, 2020 FINDINGS: Decreased lung volumes are seen which is likely secondary to the degree of patient inspiration. There is no evidence of acute infiltrate, pleural effusion or pneumothorax. A trace amount of atelectasis is seen within the bilateral lung bases. The heart size and mediastinal contours are within normal limits. A mildly displaced fracture of the distal right clavicle is noted. IMPRESSION: 1. No acute cardiopulmonary disease. 2. Mildly displaced fracture of the distal right clavicle. Electronically Signed   By: Virgina Norfolk M.D.   On: 06/07/2020 17:46       T. [place]  If 7PM-7AM, please contact night-coverage [email or web address] Password [email or web address] 06/08/2020, 12:35 PM

## 2020-06-08 NOTE — Plan of Care (Signed)
  Problem: Education: Goal: Knowledge of General Education information will improve Description: Including pain rating scale, medication(s)/side effects and non-pharmacologic comfort measures Outcome: Not Progressing   Problem: Health Behavior/Discharge Planning: Goal: Ability to manage health-related needs will improve Outcome: Not Progressing

## 2020-06-08 NOTE — Plan of Care (Signed)
  Problem: Education: Goal: Knowledge of General Education information will improve Description: Including pain rating scale, medication(s)/side effects and non-pharmacologic comfort measures Outcome: Progressing   Problem: Health Behavior/Discharge Planning: Goal: Ability to manage health-related needs will improve Outcome: Progressing   Problem: Clinical Measurements: Goal: Ability to maintain clinical measurements within normal limits will improve Outcome: Progressing Goal: Will remain free from infection Outcome: Progressing Goal: Diagnostic test results will improve Outcome: Progressing Goal: Respiratory complications will improve Outcome: Progressing Goal: Cardiovascular complication will be avoided Outcome: Progressing   Problem: Elimination: Goal: Will not experience complications related to bowel motility Outcome: Progressing Goal: Will not experience complications related to urinary retention Outcome: Progressing   Problem: Pain Managment: Goal: General experience of comfort will improve Outcome: Progressing   Problem: Safety: Goal: Ability to remain free from injury will improve Outcome: Progressing

## 2020-06-08 NOTE — Care Management Important Message (Signed)
Important Message  Patient Details  Name: Douglas Peterson. MRN: [id number] Date of Birth: 10/20/1951   Medicare Important Message Given:  N/A - LOS <3 / Initial given by admissions     Juliann Pulse A  06/08/2020, 8:41 AM

## 2020-06-09 DIAGNOSIS — F10929 Alcohol use, unspecified with intoxication, unspecified: Secondary | ICD-10-CM | POA: Diagnosis not present

## 2020-06-09 DIAGNOSIS — S42031A Displaced fracture of lateral end of right clavicle, initial encounter for closed fracture: Secondary | ICD-10-CM | POA: Diagnosis not present

## 2020-06-09 DIAGNOSIS — W19XXXA Unspecified fall, initial encounter: Secondary | ICD-10-CM | POA: Diagnosis not present

## 2020-06-09 DIAGNOSIS — F10230 Alcohol dependence with withdrawal, uncomplicated: Secondary | ICD-10-CM | POA: Diagnosis not present

## 2020-06-09 NOTE — Progress Notes (Signed)
PROGRESS NOTE  Douglas Peterson. MVE:[id number] DOB: [birth year]   PCP: [name], MD  [name] from: Home.  Uses cane at baseline.  Lives alone.  DOA: 06/05/2020 LOS: 3  Brief Narrative / Interim history: 69 year old male with history of HTN, GERD, depression, A. fib, alcohol abuse chronic right-sided weakness presenting with right shoulder pain after fall at home, and admitted for alcohol intoxication/withdrawal, rhabdomyolysis and right distal clavicular fracture.  CT head without acute finding.  Psychiatry consulted to assess for capacity to determine guardianship by APS.  Subjective: Seen and examined earlier this morning.  No major events overnight of this morning.  Has no complaints.  He is more alert today.  He is oriented to self, place, month and situation.  Denies pain.   Objective: Vitals:   06/08/20 1459 06/08/20 2332 06/09/20 0406 06/09/20 0726  BP: 120/68 (!) 148/78 (!) 154/97 136/60  Pulse: 62  66 61  Resp: 16  17 16   Temp: 98 F (36.7 C)  97.7 F (36.5 C) 98.2 F (36.8 C)  TempSrc: Oral  Oral Oral  SpO2: 95%  94% 95%    Intake/Output Summary (Last 24 hours) at 06/09/2020 1412 Last data filed at 06/09/2020 1404 Gross per 24 hour  Intake 240 ml  Output 100 ml  Net 140 ml   There were no vitals filed for this visit.  Examination:  GENERAL: Frail looking elderly male.  Nontoxic. HEENT: MMM.  Vision and hearing grossly intact.  NECK: Supple.  No apparent JVD.  RESP: On RA.  No IWOB.  Fair aeration bilaterally. CVS:  RRR. Heart sounds normal.  ABD/GI/GU: BS+. Abd soft, NTND.  MSK/EXT:  Moves extremities. No apparent deformity. No edema.  SKIN: no apparent skin lesion or wound NEURO: Awake, alert and oriented appropriately.  Coarse intention tremor in left arm. PSYCH: Calm. Normal affect.  GENERAL: Frail looking. Nontoxic. HEENT: MMM.  Vision and hearing grossly intact.  NECK: Supple.  No apparent JVD.  RESP: On RA.  No IWOB.  Fair aeration  bilaterally. CVS:  RRR. Heart sounds normal.  ABD/GI/GU: BS+. Abd soft, NTND.  MSK/EXT: Right arm in a sling. SKIN: no apparent skin lesion or wound NEURO: Awake, alert and oriented to self, place, situation.  Coarse intention tremor in left arm. PSYCH: Calm. Normal affect.   Procedures:  None  Microbiology summarized: COVID-19 PCR negative. C. difficile PCR pending.  Assessment & Plan: Fall  at home-likely due to alcohol intoxication. Closed fracture of right distal clavicle:  -Supportive care with sling and pain control  -PT/OT  Alcoholic intoxication/withdrawal: Reports drinking about 18 cans of beer a day.  -Continue CIWA monitoring with as needed Ativan and Librium taper -Consult CM/SW -Encouraged cessation. -Vitamins and folic acid.  Transient respiratory distress: Resolved.  Concern about aspiration.  CXR without acute finding.  BNP slightly elevated to 133.  Appears euvolemic. -Aspiration precautions -SLP eval  Dysphagia: Some concern about aspiration on 8/5. -Aspiration precaution. -Dysphagia 2 diet.  Hypomagnesemia: Likely due to alcohol.  Resolved.  Depression: Seems stable.  Denies SI/HI/audiovisual hallucination. -Continue home doxepin, Paxil  Traumatic rhabdomyolysis: CK 452> 227.  Likely due to alcohol and fall.  Improved.  -Continue IV fluid.  GERD (gastroesophageal reflux disease) -Protonix  Diarrhea?  has history of C. Difficile.  No diarrhea here. -DC enteric precautions  Mild thrombocytopenia: Likely due to alcohol. -Continue monitoring  Social issues: Lives alone.  Drinks heavily.  Found lying in fecal matters when EMS arrived. -Agrees to go to  SNF.  Debility/physical deconditioning -PT/OT  Severe protein-calorie malnutrition: As evidenced by significant muscle mass and subcu fat loss. -Nutrition supplement, Ensure -Appreciate input by dietitian. There is no height or weight on file to calculate BMI. Nutrition Problem: Severe  Malnutrition Etiology: social / environmental circumstances (EtOH abuse) Signs/Symptoms: severe fat depletion, moderate muscle depletion, severe muscle depletion Interventions: Refer to RD note for recommendations   Pressure skin injury Pressure Injury 06/05/20 Coccyx Posterior;Medial Stage 1 -  Intact skin with non-blanchable redness of a localized area usually over a bony prominence. red intact dry skin (Active)  06/05/20 1902  Location: Coccyx  Location Orientation: Posterior;Medial  Staging: Stage 1 -  Intact skin with non-blanchable redness of a localized area usually over a bony prominence.  Wound Description (Comments): red intact dry skin  Present on Admission: Yes   DVT prophylaxis:  enoxaparin (LOVENOX) injection 40 mg Start: 06/05/20 2200  Code Status: Full code Family Communication: None at bedside.  Patient does not want family involved. Status is: Inpatient  Remains inpatient appropriate because:Unsafe d/c plan and Ongoing alcohol withdrawal   Dispo: The patient is from: Home              Anticipated d/c is to: SNF              Anticipated d/c date is: 1 day              Patient currently is medically stable to d/c.            Consultants:  Psychiatry   Sch Meds:  Scheduled Meds: . ascorbic acid  1,000 mg Oral Daily  . aspirin EC  81 mg Oral Daily  . atenolol  25 mg Oral BID  . calcium-vitamin D  1 tablet Oral Q breakfast  . cholecalciferol  5,000 Units Oral Daily  . enoxaparin (LOVENOX) injection  40 mg Subcutaneous Q24H  . feeding supplement (NEPRO CARB STEADY)  237 mL Oral TID BM  . folic acid  1 mg Oral Daily  . lidocaine  1 patch Transdermal Q24H  . LORazepam  0-4 mg Intravenous Q6H   Or  . LORazepam  0-4 mg Oral Q6H  . multivitamin with minerals  1 tablet Oral Daily  . pantoprazole  40 mg Oral Daily  . PARoxetine  30 mg Oral Daily  . thiamine  100 mg Oral Daily   Or  . thiamine  100 mg Intravenous Daily   Continuous Infusions:  PRN  Meds:.acetaminophen, doxepin, hydrALAZINE, ibuprofen, methocarbamol, morphine injection, ondansetron (ZOFRAN) IV, oxyCODONE-acetaminophen, polyethylene glycol, senna-docusate  Antimicrobials: Anti-infectives (From admission, onward)   None       I have personally reviewed the following labs and images: CBC: Recent Labs  Lab 06/05/20 0204 06/06/20 0451 06/07/20 0626  WBC 14.0* 6.0 5.5  NEUTROABS 4.8  --   --   HGB 14.3 12.8* 12.3*  HCT 41.2 38.2* 36.5*  MCV 88.4 91.8 90.6  PLT 252 177 145*   BMP &GFR Recent Labs  Lab 06/05/20 0204 06/05/20 0831 06/05/20 0915 06/06/20 0451 06/07/20 0626 06/08/20 0609  NA 129* 138  --  140 140 137  K 4.2 4.1  --  3.8 3.7 3.6  CL 91* 100  --  104 108 100  CO2 25 25  --  28 24 28   GLUCOSE 91 84  --  108* 92 88  BUN <5* <5*  --  18 10 10   CREATININE 0.51* 0.48*  --  0.60* 0.50* 0.50*  CALCIUM 8.4* 8.7*  --  8.9 8.5* 8.8*  MG  --   --  2.1  --  1.6* 1.8  PHOS  --   --  2.9  --  3.5 2.8   CrCl cannot be calculated (Unknown ideal weight.). Liver & Pancreas: Recent Labs  Lab 06/05/20 0204 06/07/20 0626 06/08/20 0609  AST 52* 26  --   ALT 30 22  --   ALKPHOS 89 69  --   BILITOT 1.4* 0.9  --   PROT 7.8 6.0*  --   ALBUMIN 4.5 3.4* 3.8   Recent Labs  Lab 06/05/20 0204  LIPASE 29   Recent Labs  Lab 06/05/20 0242  AMMONIA 21   Diabetic: No results for input(s): HGBA1C in the last 72 hours. No results for input(s): GLUCAP in the last 168 hours. Cardiac Enzymes: Recent Labs  Lab 06/05/20 0204 06/05/20 0915 06/06/20 0451  CKTOTAL 568* 452* 227   No results for input(s): PROBNP in the last 8760 hours. Coagulation Profile: No results for input(s): INR, PROTIME in the last 168 hours. Thyroid Function Tests: No results for input(s): TSH, T4TOTAL, FREET4, T3FREE, THYROIDAB in the last 72 hours. Lipid Profile: No results for input(s): CHOL, HDL, LDLCALC, TRIG, CHOLHDL, LDLDIRECT in the last 72 hours. Anemia Panel: No  results for input(s): VITAMINB12, FOLATE, FERRITIN, TIBC, IRON, RETICCTPCT in the last 72 hours. Urine analysis:    Component Value Date/Time   COLORURINE STRAW (A) 06/05/2020 0415   APPEARANCEUR CLEAR (A) 06/05/2020 0415   LABSPEC 1.001 (L) 06/05/2020 0415   PHURINE 6.0 06/05/2020 0415   GLUCOSEU NEGATIVE 06/05/2020 0415   HGBUR NEGATIVE 06/05/2020 0415   BILIRUBINUR NEGATIVE 06/05/2020 [place] 06/05/2020 0415   PROTEINUR NEGATIVE 06/05/2020 0415   NITRITE NEGATIVE 06/05/2020 0415   LEUKOCYTESUR NEGATIVE 06/05/2020 0415   Sepsis Labs: Invalid input(s): PROCALCITONIN, [place]  Microbiology: Recent Results (from the past 240 hour(s))  SARS Coronavirus 2 by RT PCR (hospital order, performed in [facility] hospital lab) Nasopharyngeal Nasopharyngeal Swab     Status: None   Collection Time: 06/05/20 11:38 AM   Specimen: Nasopharyngeal Swab  Result Value Ref Range Status   SARS Coronavirus 2 NEGATIVE NEGATIVE Final    Comment: (NOTE) SARS-CoV-2 target nucleic acids are NOT DETECTED.  The SARS-CoV-2 RNA is generally detectable in upper and lower respiratory specimens during the acute phase of infection. The lowest concentration of SARS-CoV-2 viral copies this assay can detect is 250 copies / mL. A negative result does not preclude SARS-CoV-2 infection and should not be used as the sole basis for treatment or other patient management decisions.  A negative result may occur with improper specimen collection / handling, submission of specimen other than nasopharyngeal swab, presence of viral mutation(s) within the areas targeted by this assay, and inadequate number of viral copies (<250 copies / mL). A negative result must be combined with clinical observations, patient history, and epidemiological information.  Fact Sheet for Patients:   StrictlyIdeas.no  Fact Sheet for Healthcare  Providers: BankingDealers.co.za  This test is not yet approved or  cleared by the [place] FDA and has been authorized for detection and/or diagnosis of SARS-CoV-2 by FDA under an Emergency Use Authorization (EUA).  This EUA will remain in effect (meaning this test can be used) for the duration of the COVID-19 declaration under Section 564(b)(1) of the Act, 21 U.S.C. section 360bbb-3(b)(1), unless the authorization is terminated or revoked sooner.  Performed at [facility],  [place], [place] [address]     Radiology Studies: No results found.     T. [place]  If 7PM-7AM, please contact night-coverage [email or web address] Password [email or web address] 06/09/2020, 2:12 PM

## 2020-06-10 DIAGNOSIS — S42031A Displaced fracture of lateral end of right clavicle, initial encounter for closed fracture: Secondary | ICD-10-CM | POA: Diagnosis not present

## 2020-06-10 DIAGNOSIS — F10929 Alcohol use, unspecified with intoxication, unspecified: Secondary | ICD-10-CM | POA: Diagnosis not present

## 2020-06-10 DIAGNOSIS — F10230 Alcohol dependence with withdrawal, uncomplicated: Secondary | ICD-10-CM | POA: Diagnosis not present

## 2020-06-10 DIAGNOSIS — W19XXXA Unspecified fall, initial encounter: Secondary | ICD-10-CM | POA: Diagnosis not present

## 2020-06-10 NOTE — Progress Notes (Signed)
PROGRESS NOTE  Douglas Peterson. EVO:[id number] DOB: Mar 31, 1951   PCP: [name], MD  [name] from: Home.  Uses cane at baseline.  Lives alone.  DOA: 06/05/2020 LOS: 4  Brief Narrative / Interim history: 69 year old male with history of HTN, GERD, depression, A. fib, alcohol abuse chronic right-sided weakness presenting with right shoulder pain after fall at home, and admitted for alcohol intoxication/withdrawal, rhabdomyolysis and right distal clavicular fracture.  CT head without acute finding.  Psychiatry consulted to assess for capacity to determine guardianship by APS.  Subjective: Seen and examined earlier this morning. No major events overnight of this morning. Planes about dysphagia diet stating that he has no problems swallowing. He does not agree with SLP evaluation recommendation. No other complaints.  Objective: Vitals:   06/09/20 0406 06/09/20 0726 06/09/20 2237 06/10/20 0807  BP: (!) 154/97 136/60 (!) 157/80 (!) 152/84  Pulse: 66 61 66 (!) 58  Resp: 17 16 16 16   Temp: 97.7 F (36.5 C) 98.2 F (36.8 C) 98.2 F (36.8 C) 98.2 F (36.8 C)  TempSrc: Oral Oral Oral Oral  SpO2: 94% 95% 96% 93%    Intake/Output Summary (Last 24 hours) at 06/10/2020 1326 Last data filed at 06/10/2020 1007 Gross per 24 hour  Intake 240 ml  Output 300 ml  Net -60 ml   There were no vitals filed for this visit.  Examination:  GENERAL: Frail looking male. No distress. Nontoxic. HEENT: MMM.  Vision and hearing grossly intact.  NECK: Supple.  No apparent JVD.  RESP: On RA. No IWOB.  Fair aeration bilaterally. CVS:  RRR. Heart sounds normal.  ABD/GI/GU: BS+. Abd soft, NTND.  MSK/EXT: Right arm in a sling. SKIN: no apparent skin lesion or wound NEURO: Awake, alert and oriented self, place and situation but limited insight. PSYCH: Calm. Normal affect.   Procedures:  None  Microbiology summarized: COVID-19 PCR negative. C. difficile PCR pending.  Assessment & Plan: Fall  at  home-likely due to alcohol intoxication. Closed fracture of right distal clavicle:  -Supportive care with sling and pain control  -PT/OT  Alcoholic intoxication/withdrawal: Reports drinking about 18 cans of beer a day.  -Continue CIWA monitoring with as needed Ativan and Librium taper -Consult CM/SW -Encouraged cessation. -Vitamins and folic acid.  Transient respiratory distress: Resolved.  Concern about aspiration.  CXR without acute finding.  BNP slightly elevated to 133.  Appears euvolemic. -Aspiration precautions and dysphagia 2 diet.  Dysphagia: Some concern about aspiration on 8/5. -Aspiration precaution. -Dysphagia 2 diet.  Hypomagnesemia: Likely due to alcohol.  Resolved.  Depression: Seems stable.  Denies SI/HI/audiovisual hallucination. -Continue home doxepin, Paxil  Traumatic rhabdomyolysis: CK 452> 227.  Likely due to alcohol and fall.  Improved.  -Continue IV fluid.  GERD (gastroesophageal reflux disease) -Protonix  Constipation: -Bowel regimens.  Mild thrombocytopenia: Likely due to alcohol. -Continue monitoring  Social issues: Lives alone.  Drinks heavily.  Found lying in fecal matters when EMS arrived. -Agrees to go to SNF.  Debility/physical deconditioning -PT/OT  Severe protein-calorie malnutrition: As evidenced by significant muscle mass and subcu fat loss. -Nutrition supplement, Ensure -Appreciate input by dietitian. There is no height or weight on file to calculate BMI. Nutrition Problem: Severe Malnutrition Etiology: social / environmental circumstances (EtOH abuse) Signs/Symptoms: severe fat depletion, moderate muscle depletion, severe muscle depletion Interventions: Refer to RD note for recommendations   Pressure skin injury Pressure Injury 06/05/20 Coccyx Posterior;Medial Stage 1 -  Intact skin with non-blanchable redness of a localized area usually over  a bony prominence. red intact dry skin (Active)  06/05/20 1902  Location: Coccyx   Location Orientation: Posterior;Medial  Staging: Stage 1 -  Intact skin with non-blanchable redness of a localized area usually over a bony prominence.  Wound Description (Comments): red intact dry skin  Present on Admission: Yes   DVT prophylaxis:  enoxaparin (LOVENOX) injection 40 mg Start: 06/05/20 2200  Code Status: Full code Family Communication: None at bedside.  Patient does not want family involved. Status is: Inpatient  Remains inpatient appropriate because:Unsafe d/c plan   Dispo: The patient is from: Home              Anticipated d/c is to: SNF              Anticipated d/c date is: 1 day              Patient currently is medically stable to d/c.            Consultants:  Psychiatry   Sch Meds:  Scheduled Meds: . ascorbic acid  1,000 mg Oral Daily  . aspirin EC  81 mg Oral Daily  . atenolol  25 mg Oral BID  . calcium-vitamin D  1 tablet Oral Q breakfast  . cholecalciferol  5,000 Units Oral Daily  . enoxaparin (LOVENOX) injection  40 mg Subcutaneous Q24H  . feeding supplement (NEPRO CARB STEADY)  237 mL Oral TID BM  . folic acid  1 mg Oral Daily  . lidocaine  1 patch Transdermal Q24H  . multivitamin with minerals  1 tablet Oral Daily  . pantoprazole  40 mg Oral Daily  . PARoxetine  30 mg Oral Daily  . thiamine  100 mg Oral Daily   Or  . thiamine  100 mg Intravenous Daily   Continuous Infusions:  PRN Meds:.acetaminophen, doxepin, hydrALAZINE, ibuprofen, methocarbamol, morphine injection, ondansetron (ZOFRAN) IV, oxyCODONE-acetaminophen, polyethylene glycol, senna-docusate  Antimicrobials: Anti-infectives (From admission, onward)   None       I have personally reviewed the following labs and images: CBC: Recent Labs  Lab 06/05/20 0204 06/06/20 0451 06/07/20 0626  WBC 14.0* 6.0 5.5  NEUTROABS 4.8  --   --   HGB 14.3 12.8* 12.3*  HCT 41.2 38.2* 36.5*  MCV 88.4 91.8 90.6  PLT 252 177 145*   BMP &GFR Recent Labs  Lab 06/05/20 0204  06/05/20 0831 06/05/20 0915 06/06/20 0451 06/07/20 0626 06/08/20 0609  NA 129* 138  --  140 140 137  K 4.2 4.1  --  3.8 3.7 3.6  CL 91* 100  --  104 108 100  CO2 25 25  --  28 24 28   GLUCOSE 91 84  --  108* 92 88  BUN <5* <5*  --  18 10 10   CREATININE 0.51* 0.48*  --  0.60* 0.50* 0.50*  CALCIUM 8.4* 8.7*  --  8.9 8.5* 8.8*  MG  --   --  2.1  --  1.6* 1.8  PHOS  --   --  2.9  --  3.5 2.8   CrCl cannot be calculated (Unknown ideal weight.). Liver & Pancreas: Recent Labs  Lab 06/05/20 0204 06/07/20 0626 06/08/20 0609  AST 52* 26  --   ALT 30 22  --   ALKPHOS 89 69  --   BILITOT 1.4* 0.9  --   PROT 7.8 6.0*  --   ALBUMIN 4.5 3.4* 3.8   Recent Labs  Lab 06/05/20 0204  LIPASE 29   Recent  Labs  Lab 06/05/20 0242  AMMONIA 21   Diabetic: No results for input(s): HGBA1C in the last 72 hours. No results for input(s): GLUCAP in the last 168 hours. Cardiac Enzymes: Recent Labs  Lab 06/05/20 0204 06/05/20 0915 06/06/20 0451  CKTOTAL 568* 452* 227   No results for input(s): PROBNP in the last 8760 hours. Coagulation Profile: No results for input(s): INR, PROTIME in the last 168 hours. Thyroid Function Tests: No results for input(s): TSH, T4TOTAL, FREET4, T3FREE, THYROIDAB in the last 72 hours. Lipid Profile: No results for input(s): CHOL, HDL, LDLCALC, TRIG, CHOLHDL, LDLDIRECT in the last 72 hours. Anemia Panel: No results for input(s): VITAMINB12, FOLATE, FERRITIN, TIBC, IRON, RETICCTPCT in the last 72 hours. Urine analysis:    Component Value Date/Time   COLORURINE STRAW (A) 06/05/2020 0415   APPEARANCEUR CLEAR (A) 06/05/2020 0415   LABSPEC 1.001 (L) 06/05/2020 0415   PHURINE 6.0 06/05/2020 0415   GLUCOSEU NEGATIVE 06/05/2020 0415   HGBUR NEGATIVE 06/05/2020 0415   BILIRUBINUR NEGATIVE 06/05/2020 [place] 06/05/2020 0415   PROTEINUR NEGATIVE 06/05/2020 0415   NITRITE NEGATIVE 06/05/2020 0415   LEUKOCYTESUR NEGATIVE 06/05/2020 0415    Sepsis Labs: Invalid input(s): PROCALCITONIN, [place]  Microbiology: Recent Results (from the past 240 hour(s))  SARS Coronavirus 2 by RT PCR (hospital order, performed in [facility] hospital lab) Nasopharyngeal Nasopharyngeal Swab     Status: None   Collection Time: 06/05/20 11:38 AM   Specimen: Nasopharyngeal Swab  Result Value Ref Range Status   SARS Coronavirus 2 NEGATIVE NEGATIVE Final    Comment: (NOTE) SARS-CoV-2 target nucleic acids are NOT DETECTED.  The SARS-CoV-2 RNA is generally detectable in upper and lower respiratory specimens during the acute phase of infection. The lowest concentration of SARS-CoV-2 viral copies this assay can detect is 250 copies / mL. A negative result does not preclude SARS-CoV-2 infection and should not be used as the sole basis for treatment or other patient management decisions.  A negative result may occur with improper specimen collection / handling, submission of specimen other than nasopharyngeal swab, presence of viral mutation(s) within the areas targeted by this assay, and inadequate number of viral copies (<250 copies / mL). A negative result must be combined with clinical observations, patient history, and epidemiological information.  Fact Sheet for Patients:   StrictlyIdeas.no  Fact Sheet for Healthcare Providers: BankingDealers.co.za  This test is not yet approved or  cleared by the [place] FDA and has been authorized for detection and/or diagnosis of SARS-CoV-2 by FDA under an Emergency Use Authorization (EUA).  This EUA will remain in effect (meaning this test can be used) for the duration of the COVID-19 declaration under Section 564(b)(1) of the Act, 21 U.S.C. section 360bbb-3(b)(1), unless the authorization is terminated or revoked sooner.  Performed at [facility], [address], [place], [place] [address]     Radiology Studies: No results  found.     T. Cotton  If 7PM-7AM, please contact night-coverage [email or web address] Password [email or web address] 06/10/2020, 1:26 PM

## 2020-06-11 DIAGNOSIS — F10230 Alcohol dependence with withdrawal, uncomplicated: Secondary | ICD-10-CM | POA: Diagnosis not present

## 2020-06-11 DIAGNOSIS — S42031A Displaced fracture of lateral end of right clavicle, initial encounter for closed fracture: Secondary | ICD-10-CM | POA: Diagnosis not present

## 2020-06-11 DIAGNOSIS — F10929 Alcohol use, unspecified with intoxication, unspecified: Secondary | ICD-10-CM | POA: Diagnosis not present

## 2020-06-11 DIAGNOSIS — W19XXXA Unspecified fall, initial encounter: Secondary | ICD-10-CM | POA: Diagnosis not present

## 2020-06-11 LAB — COMPREHENSIVE METABOLIC PANEL
ALT: 21 U/L (ref 0–44)
AST: 28 U/L (ref 15–41)
Albumin: 4.3 g/dL (ref 3.5–5.0)
Alkaline Phosphatase: 76 U/L (ref 38–126)
Anion gap: 11 (ref 5–15)
BUN: 17 mg/dL (ref 8–23)
CO2: 26 mmol/L (ref 22–32)
Calcium: 9.5 mg/dL (ref 8.9–10.3)
Chloride: 101 mmol/L (ref 98–111)
Creatinine, Ser: 0.54 mg/dL — ABNORMAL LOW (ref 0.61–1.24)
GFR calc Af Amer: >60 mL/min (ref >60)
GFR calc non Af Amer: >60 mL/min (ref >60)
Glucose, Bld: 82 mg/dL (ref 70–99)
Potassium: 4.2 mmol/L (ref 3.5–5.1)
Sodium: 138 mmol/L (ref 135–145)
Total Bilirubin: 0.8 mg/dL (ref 0.3–1.2)
Total Protein: 8 g/dL (ref 6.5–8.1)

## 2020-06-11 LAB — PHOSPHORUS: Phosphorus: 4.1 mg/dL (ref 2.5–4.6)

## 2020-06-11 LAB — CBC
HCT: 40.4 % (ref 39.0–52.0)
Hemoglobin: 13.5 g/dL (ref 13.0–17.0)
MCH: 30.8 pg (ref 26.0–34.0)
MCHC: 33.4 g/dL (ref 30.0–36.0)
MCV: 92 fL (ref 80.0–100.0)
Platelets: 152 10*3/uL (ref 150–400)
RBC: 4.39 MIL/uL (ref 4.22–5.81)
RDW: 13.8 % (ref 11.5–15.5)
WBC: 7.4 10*3/uL (ref 4.0–10.5)
nRBC: 0 % (ref 0.0–0.2)

## 2020-06-11 LAB — MAGNESIUM: Magnesium: 2.1 mg/dL (ref 1.7–2.4)

## 2020-06-11 NOTE — Progress Notes (Signed)
PROGRESS NOTE  Douglas Peterson. YNW:[id number] DOB: 1950/12/03   PCP: [name], MD  [name] from: Home.  Uses cane at baseline.  Lives alone.  DOA: 06/05/2020 LOS: 5  Brief Narrative / Interim history: 69 year old male with history of HTN, GERD, depression, A. fib, alcohol abuse chronic right-sided weakness presenting with right shoulder pain after fall at home, and admitted for alcohol intoxication/withdrawal, rhabdomyolysis and right distal clavicular fracture.  CT head without acute finding.  Psychiatry consulted to assess for capacity to determine guardianship by APS but was not able to assess due to mental status.  Psych reconsulted 8/9.  Subjective: Seen and examined earlier this morning.  Patient was confused and calling 911 overnight.  There was some concern about his safety and risk of fall.  Safety sitter initiated.  However, he is now stable and oriented x4 including situations.  He has no complaints.  Objective: Vitals:   06/10/20 0807 06/10/20 1721 06/10/20 2322 06/11/20 0718  BP: (!) 152/84 (!) 149/87 (!) 162/83 (!) 148/74  Pulse: (!) 58 68 62 (!) 58  Resp: 16 16 17 20   Temp: 98.2 F (36.8 C) 98.1 F (36.7 C) 97.9 F (36.6 C) 97.9 F (36.6 C)  TempSrc: Oral Oral Oral Oral  SpO2: 93% 96% 96% 96%    Intake/Output Summary (Last 24 hours) at 06/11/2020 1326 Last data filed at 06/11/2020 1206 Gross per 24 hour  Intake 240 ml  Output 2125 ml  Net -1885 ml   There were no vitals filed for this visit.  Examination:  GENERAL: Frail looking elderly male.  Nontoxic. HEENT: MMM.  Vision and hearing grossly intact.  NECK: Supple.  No apparent JVD.  RESP:  No IWOB.  Fair aeration bilaterally. CVS:  RRR. Heart sounds normal.  ABD/GI/GU: BS+. Abd soft, NTND.  MSK/EXT: Right arm in a sling.  SKIN: Stage I pressure injury over his right upper back and sacral area. NEURO: Awake, alert and oriented x4 including situations. PSYCH: Calm. Normal affect.  Procedures:   None  Microbiology summarized: COVID-19 PCR negative. C. difficile PCR pending.  Assessment & Plan: Fall  at home-likely due to alcohol intoxication. Closed fracture of right distal clavicle:  -Supportive care with sling and pain control  -PT/OT  Alcoholic intoxication/withdrawal: Reports drinking about 18 cans of beer a day.  -Discontinue CIWA monitoring-should be outside the window for withdrawal. -Encouraged cessation. -Continue vitamins and folic acid.  Intermittent confusion/encephalopathy-likely delirium.  He is now oriented x4 including situation. -Manufacturing engineer -Reorientation and delirium precautions  Transient respiratory distress: Resolved.  Concern about aspiration.  CXR without acute finding.  BNP slightly elevated to 133.  Appears euvolemic. -Aspiration precautions and dysphagia 2 diet.  Dysphagia: Some concern about aspiration on 8/5. -Aspiration precaution. -Dysphagia 2 diet.  Hypomagnesemia: Likely due to alcohol.  Resolved.  Depression: Seems stable.  Denies SI/HI/audiovisual hallucination. -Continue home doxepin, Paxil  Traumatic rhabdomyolysis: CK 452> 227.  Likely due to alcohol and fall.  Improved.   GERD (gastroesophageal reflux disease) -Continue Protonix  Constipation: Resolved. -Bowel regimens.  Mild thrombocytopenia: Likely due to alcohol. -Continue monitoring  Social issues: Lives alone.  Drinks heavily.  Found lying in fecal matters when EMS arrived. -Agreed to go to SNF but known for changing his mind. -Psych reconsulted for capacity as requested by APS.  Debility/physical deconditioning -PT/OT  Severe protein-calorie malnutrition: As evidenced by significant muscle mass and subcu fat loss. -Nutrition supplement, Ensure -Appreciate input by dietitian. There is no height or weight  on file to calculate BMI. Nutrition Problem: Severe Malnutrition Etiology: social / environmental circumstances (EtOH  abuse) Signs/Symptoms: severe fat depletion, moderate muscle depletion, severe muscle depletion Interventions: Refer to RD note for recommendations   Pressure skin injury: POA Pressure Injury 06/05/20 Coccyx Posterior;Medial Stage 1 -  Intact skin with non-blanchable redness of a localized area usually over a bony prominence. red intact dry skin (Active)  06/05/20 1902  Location: Coccyx  Location Orientation: Posterior;Medial  Staging: Stage 1 -  Intact skin with non-blanchable redness of a localized area usually over a bony prominence.  Wound Description (Comments): red intact dry skin  Present on Admission: Yes   DVT prophylaxis:  enoxaparin (LOVENOX) injection 40 mg Start: 06/05/20 2200  Code Status: Full code Family Communication: None at bedside.  Patient does not want family involved. Status is: Inpatient  Remains inpatient appropriate because:Unsafe d/c plan   Dispo: The patient is from: Home              Anticipated d/c is to: SNF              Anticipated d/c date is: 1 day              Patient currently is medically stable to d/c.            Consultants:  Psychiatry   Sch Meds:  Scheduled Meds: . ascorbic acid  1,000 mg Oral Daily  . aspirin EC  81 mg Oral Daily  . atenolol  25 mg Oral BID  . calcium-vitamin D  1 tablet Oral Q breakfast  . cholecalciferol  5,000 Units Oral Daily  . enoxaparin (LOVENOX) injection  40 mg Subcutaneous Q24H  . feeding supplement (NEPRO CARB STEADY)  237 mL Oral TID BM  . folic acid  1 mg Oral Daily  . lidocaine  1 patch Transdermal Q24H  . multivitamin with minerals  1 tablet Oral Daily  . pantoprazole  40 mg Oral Daily  . PARoxetine  30 mg Oral Daily  . thiamine  100 mg Oral Daily   Or  . thiamine  100 mg Intravenous Daily   Continuous Infusions:  PRN Meds:.acetaminophen, doxepin, hydrALAZINE, ibuprofen, methocarbamol, morphine injection, ondansetron (ZOFRAN) IV, oxyCODONE-acetaminophen, polyethylene glycol,  senna-docusate  Antimicrobials: Anti-infectives (From admission, onward)   None       I have personally reviewed the following labs and images: CBC: Recent Labs  Lab 06/05/20 0204 06/06/20 0451 06/07/20 0626 06/11/20 0937  WBC 14.0* 6.0 5.5 7.4  NEUTROABS 4.8  --   --   --   HGB 14.3 12.8* 12.3* 13.5  HCT 41.2 38.2* 36.5* 40.4  MCV 88.4 91.8 90.6 92.0  PLT 252 177 145* 152   BMP &GFR Recent Labs  Lab 06/05/20 0831 06/05/20 0915 06/06/20 0451 06/07/20 0626 06/08/20 0609 06/11/20 0830  NA 138  --  140 140 137 138  K 4.1  --  3.8 3.7 3.6 4.2  CL 100  --  104 108 100 101  CO2 25  --  28 24 28 26   GLUCOSE 84  --  108* 92 88 82  BUN <5*  --  18 10 10 17   CREATININE 0.48*  --  0.60* 0.50* 0.50* 0.54*  CALCIUM 8.7*  --  8.9 8.5* 8.8* 9.5  MG  --  2.1  --  1.6* 1.8 2.1  PHOS  --  2.9  --  3.5 2.8 4.1   CrCl cannot be calculated (Unknown ideal weight.). Liver &  Pancreas: Recent Labs  Lab 06/05/20 0204 06/07/20 0626 06/08/20 0609 06/11/20 0830  AST 52* 26  --  28  ALT 30 22  --  21  ALKPHOS 89 69  --  76  BILITOT 1.4* 0.9  --  0.8  PROT 7.8 6.0*  --  8.0  ALBUMIN 4.5 3.4* 3.8 4.3   Recent Labs  Lab 06/05/20 0204  LIPASE 29   Recent Labs  Lab 06/05/20 0242  AMMONIA 21   Diabetic: No results for input(s): HGBA1C in the last 72 hours. No results for input(s): GLUCAP in the last 168 hours. Cardiac Enzymes: Recent Labs  Lab 06/05/20 0204 06/05/20 0915 06/06/20 0451  CKTOTAL 568* 452* 227   No results for input(s): PROBNP in the last 8760 hours. Coagulation Profile: No results for input(s): INR, PROTIME in the last 168 hours. Thyroid Function Tests: No results for input(s): TSH, T4TOTAL, FREET4, T3FREE, THYROIDAB in the last 72 hours. Lipid Profile: No results for input(s): CHOL, HDL, LDLCALC, TRIG, CHOLHDL, LDLDIRECT in the last 72 hours. Anemia Panel: No results for input(s): VITAMINB12, FOLATE, FERRITIN, TIBC, IRON, RETICCTPCT in the last 72  hours. Urine analysis:    Component Value Date/Time   COLORURINE STRAW (A) 06/05/2020 0415   APPEARANCEUR CLEAR (A) 06/05/2020 0415   LABSPEC 1.001 (L) 06/05/2020 0415   PHURINE 6.0 06/05/2020 0415   GLUCOSEU NEGATIVE 06/05/2020 0415   HGBUR NEGATIVE 06/05/2020 0415   BILIRUBINUR NEGATIVE 06/05/2020 [place] 06/05/2020 0415   PROTEINUR NEGATIVE 06/05/2020 0415   NITRITE NEGATIVE 06/05/2020 0415   LEUKOCYTESUR NEGATIVE 06/05/2020 0415   Sepsis Labs: Invalid input(s): PROCALCITONIN, [place]  Microbiology: Recent Results (from the past 240 hour(s))  SARS Coronavirus 2 by RT PCR (hospital order, performed in [facility] hospital lab) Nasopharyngeal Nasopharyngeal Swab     Status: None   Collection Time: 06/05/20 11:38 AM   Specimen: Nasopharyngeal Swab  Result Value Ref Range Status   SARS Coronavirus 2 NEGATIVE NEGATIVE Final    Comment: (NOTE) SARS-CoV-2 target nucleic acids are NOT DETECTED.  The SARS-CoV-2 RNA is generally detectable in upper and lower respiratory specimens during the acute phase of infection. The lowest concentration of SARS-CoV-2 viral copies this assay can detect is 250 copies / mL. A negative result does not preclude SARS-CoV-2 infection and should not be used as the sole basis for treatment or other patient management decisions.  A negative result may occur with improper specimen collection / handling, submission of specimen other than nasopharyngeal swab, presence of viral mutation(s) within the areas targeted by this assay, and inadequate number of viral copies (<250 copies / mL). A negative result must be combined with clinical observations, patient history, and epidemiological information.  Fact Sheet for Patients:   StrictlyIdeas.no  Fact Sheet for Healthcare Providers: BankingDealers.co.za  This test is not yet approved or  cleared by the [place] FDA and has been  authorized for detection and/or diagnosis of SARS-CoV-2 by FDA under an Emergency Use Authorization (EUA).  This EUA will remain in effect (meaning this test can be used) for the duration of the COVID-19 declaration under Section 564(b)(1) of the Act, 21 U.S.C. section 360bbb-3(b)(1), unless the authorization is terminated or revoked sooner.  Performed at [facility], [address] [place], [place] [address]     Radiology Studies: No results found.     T. Samoset  If 7PM-7AM, please contact night-coverage [email or web address] Password [email or web address] 06/11/2020, 1:26 PM

## 2020-06-11 NOTE — TOC Progression Note (Signed)
Transition of Care (TOC) - Progression Note    Patient Details  Name: Douglas Peterson. MRN: [id number] Date of Birth: 1950/12/06  Transition of Care [facility]) CM/SW Contact  Shelbie Ammons, RN Phone Number: 06/11/2020, 1:49 PM  Clinical Narrative:   RNCM met with patient at bedside. [name] still pending Psych eval. Patient initially sleeping on arrival. He reports that he is having some shoulder pain today. He reports that he would like to go to a facility when he leaves here.          Expected Discharge Plan and Services                                                 Social Determinants of Health (SDOH) Interventions    Readmission Risk Interventions Readmission Risk Prevention Plan 05/19/2020 12/13/2019 12/12/2019  Transportation Screening Complete Complete Complete  PCP or Specialist Appt within 3-5 Days - (No Data) -  HRI or [place] - (No Data) -  Palliative Care Screening - Complete Complete  Medication Review (RN Care Manager) Complete Complete Complete  PCP or Specialist appointment within 3-5 days of discharge Complete - -  [place] or Home Care Consult Complete - -  SW Recovery Care/Counseling Consult Complete - -  Palliative Care Screening Not Applicable - -  [place] Not Applicable - -  Some recent data might be hidden

## 2020-06-11 NOTE — Care Management Important Message (Signed)
Important Message  Patient Details  Name: Douglas Peterson. MRN: [id number] Date of Birth: 11/02/1951   Medicare Important Message Given:  Yes     Loann Quill 06/11/2020, 1:27 PM
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
## 2020-06-12 DIAGNOSIS — W19XXXA Unspecified fall, initial encounter: Secondary | ICD-10-CM | POA: Diagnosis not present

## 2020-06-12 DIAGNOSIS — F10929 Alcohol use, unspecified with intoxication, unspecified: Secondary | ICD-10-CM | POA: Diagnosis not present

## 2020-06-12 DIAGNOSIS — S42031A Displaced fracture of lateral end of right clavicle, initial encounter for closed fracture: Secondary | ICD-10-CM | POA: Diagnosis not present

## 2020-06-12 DIAGNOSIS — I10 Essential (primary) hypertension: Secondary | ICD-10-CM

## 2020-06-12 DIAGNOSIS — F10230 Alcohol dependence with withdrawal, uncomplicated: Secondary | ICD-10-CM | POA: Diagnosis not present

## 2020-06-12 MED ORDER — ACETAMINOPHEN 325 MG PO TABS
650.0000 mg | ORAL_TABLET | Freq: Three times a day (TID) | ORAL | Status: DC
  Administered 2020-06-12 – 2020-06-25 (×37): 650 mg via ORAL
  Filled 2020-06-12 (×42): qty 2

## 2020-06-12 MED ORDER — OXYCODONE HCL 5 MG PO TABS
5.0000 mg | ORAL_TABLET | Freq: Four times a day (QID) | ORAL | Status: DC | PRN
  Administered 2020-06-12 – 2020-06-26 (×29): 5 mg via ORAL
  Filled 2020-06-12 (×29): qty 1
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
## 2020-06-12 NOTE — Progress Notes (Signed)
PROGRESS NOTE  Douglas Peterson. NOM:[id number] DOB: 17-Oct-1951   PCP: [name], MD  [name] from: Home.  Uses cane at baseline.  Lives alone.  DOA: 06/05/2020 LOS: 6  Brief Narrative / Interim history: 69 year old male with history of HTN, GERD, depression, A. fib, alcohol abuse chronic right-sided weakness presenting with right shoulder pain after fall at home, and admitted for alcohol intoxication/withdrawal, rhabdomyolysis and right distal clavicular fracture.  CT head without acute finding.  Psychiatry consulted to assess for capacity to determine guardianship by APS but was not able to assess due to mental status.  Psych consulted.  Waiting on SNF bed.  Subjective: Seen and examined earlier this morning.  No major events overnight of this morning.  Complains some pain in his right shoulder.  Denies chest pain, dyspnea, GI or UTI symptoms.  Oriented x4.   Objective: Vitals:   06/11/20 0718 06/11/20 1606 06/12/20 0110 06/12/20 0829  BP: (!) 148/74 (!) 142/85 (!) 131/104 (!) 161/82  Pulse: (!) 58 71 63 (!) 53  Resp: 20 16 20 16   Temp: 97.9 F (36.6 C) (!) 97.5 F (36.4 C) 98 F (36.7 C) 97.7 F (36.5 C)  TempSrc: Oral Oral Oral   SpO2: 96% 97% 99% 98%    Intake/Output Summary (Last 24 hours) at 06/12/2020 1345 Last data filed at 06/12/2020 1003 Gross per 24 hour  Intake 540 ml  Output 500 ml  Net 40 ml   There were no vitals filed for this visit.  Examination:  GENERAL: Frail looking elderly male.  Nontoxic.  No apparent distress. HEENT: MMM.  Vision and hearing grossly intact.  NECK: Supple.  No apparent JVD.  RESP: On RA.  No IWOB.  Fair aeration bilaterally. CVS:  RRR. Heart sounds normal.  ABD/GI/GU: BS+. Abd soft, NTND.  MSK/EXT: RUE in sling.  No edema. SKIN: no apparent skin lesion or wound NEURO: Awake, alert and oriented x4 but limited insight.  No apparent focal neuro deficit. PSYCH: Calm. Normal affect.  Procedures:  None  Microbiology  summarized: COVID-19 PCR negative. C. difficile PCR pending.  Assessment & Plan: Fall  at home-likely due to alcohol intoxication. Closed fracture of right distal clavicle:  -Supportive care with sling and pain control -Scheduled Tylenol with as needed OxyIR.  -PT/OT  Alcoholic intoxication/withdrawal: Reports drinking about 18 cans of beer a day.  -Discontinue CIWA monitoring-should be outside the window for withdrawal. -Encouraged cessation. -Continue vitamins and folic acid.  Intermittent confusion/encephalopathy-likely delirium.  He is now oriented x4 but limited insight. -Reorientation and delirium precautions -Psych to see patient today (8/10). -Minimize sedating medications.  Transient respiratory distress: Resolved.  Concern about aspiration.  CXR without acute finding.  BNP slightly elevated to 133.  Appears euvolemic. -Aspiration precautions and dysphagia 2 diet.  Essential hypertension: BP slightly elevated. -Continue home atenolol and Cardura. -Continue as needed hydralazine -Discontinue ibuprofen  Dysphagia: Some concern about aspiration on 8/5. -Aspiration precaution. -Dysphagia 2 diet.  Hypomagnesemia: Likely due to alcohol.  Resolved.  Depression: Seems stable.  Denies SI/HI/audiovisual hallucination. -Continue home doxepin, Paxil  Traumatic rhabdomyolysis: CK 452> 227.  Likely due to alcohol and fall.  Improved.   GERD (gastroesophageal reflux disease) -Continue Protonix  Constipation: Resolved. -Bowel regimens.  Mild thrombocytopenia: Likely due to alcohol. -Continue monitoring  Social issues: Lives alone.  Drinks heavily.  Found lying in fecal matters when EMS arrived. -Agreed to go to SNF but known for changing his mind. -Psych reconsulted for capacity as requested by APS.  Debility/physical deconditioning -PT/OT  Severe protein-calorie malnutrition: As evidenced by significant muscle mass and subcu fat loss. -Nutrition supplement,  Ensure -Appreciate input by dietitian. There is no height or weight on file to calculate BMI. Nutrition Problem: Severe Malnutrition Etiology: social / environmental circumstances (EtOH abuse) Signs/Symptoms: severe fat depletion, moderate muscle depletion, severe muscle depletion Interventions: Refer to RD note for recommendations   Pressure skin injury: POA Pressure Injury 06/05/20 Coccyx Posterior;Medial Stage 1 -  Intact skin with non-blanchable redness of a localized area usually over a bony prominence. red intact dry skin (Active)  06/05/20 1902  Location: Coccyx  Location Orientation: Posterior;Medial  Staging: Stage 1 -  Intact skin with non-blanchable redness of a localized area usually over a bony prominence.  Wound Description (Comments): red intact dry skin  Present on Admission: Yes   DVT prophylaxis:  enoxaparin (LOVENOX) injection 40 mg Start: 06/05/20 2200  Code Status: Full code Family Communication: None at bedside.  Patient does not want family involved. Status is: Inpatient  Remains inpatient appropriate because:Unsafe d/c plan   Dispo: The patient is from: Home              Anticipated d/c is to: SNF              Anticipated d/c date is: 1 day              Patient currently is medically stable to d/c.            Consultants:  Psychiatry   Sch Meds:  Scheduled Meds: . acetaminophen  650 mg Oral Q8H  . ascorbic acid  1,000 mg Oral Daily  . aspirin EC  81 mg Oral Daily  . atenolol  25 mg Oral BID  . calcium-vitamin D  1 tablet Oral Q breakfast  . cholecalciferol  5,000 Units Oral Daily  . enoxaparin (LOVENOX) injection  40 mg Subcutaneous Q24H  . feeding supplement (NEPRO CARB STEADY)  237 mL Oral TID BM  . folic acid  1 mg Oral Daily  . lidocaine  1 patch Transdermal Q24H  . multivitamin with minerals  1 tablet Oral Daily  . pantoprazole  40 mg Oral Daily  . PARoxetine  30 mg Oral Daily  . thiamine  100 mg Oral Daily   Or  . thiamine   100 mg Intravenous Daily   Continuous Infusions:  PRN Meds:.doxepin, hydrALAZINE, ondansetron (ZOFRAN) IV, oxyCODONE, polyethylene glycol, senna-docusate  Antimicrobials: Anti-infectives (From admission, onward)   None       I have personally reviewed the following labs and images: CBC: Recent Labs  Lab 06/06/20 0451 06/07/20 0626 06/11/20 0937  WBC 6.0 5.5 7.4  HGB 12.8* 12.3* 13.5  HCT 38.2* 36.5* 40.4  MCV 91.8 90.6 92.0  PLT 177 145* 152   BMP &GFR Recent Labs  Lab 06/06/20 0451 06/07/20 0626 06/08/20 0609 06/11/20 0830  NA 140 140 137 138  K 3.8 3.7 3.6 4.2  CL 104 108 100 101  CO2 28 24 28 26   GLUCOSE 108* 92 88 82  BUN 18 10 10 17   CREATININE 0.60* 0.50* 0.50* 0.54*  CALCIUM 8.9 8.5* 8.8* 9.5  MG  --  1.6* 1.8 2.1  PHOS  --  3.5 2.8 4.1   CrCl cannot be calculated (Unknown ideal weight.). Liver & Pancreas: Recent Labs  Lab 06/07/20 0626 06/08/20 0609 06/11/20 0830  AST 26  --  28  ALT 22  --  21  ALKPHOS 69  --  76  BILITOT 0.9  --  0.8  PROT 6.0*  --  8.0  ALBUMIN 3.4* 3.8 4.3   No results for input(s): LIPASE, AMYLASE in the last 168 hours. No results for input(s): AMMONIA in the last 168 hours. Diabetic: No results for input(s): HGBA1C in the last 72 hours. No results for input(s): GLUCAP in the last 168 hours. Cardiac Enzymes: Recent Labs  Lab 06/06/20 0451  CKTOTAL 227   No results for input(s): PROBNP in the last 8760 hours. Coagulation Profile: No results for input(s): INR, PROTIME in the last 168 hours. Thyroid Function Tests: No results for input(s): TSH, T4TOTAL, FREET4, T3FREE, THYROIDAB in the last 72 hours. Lipid Profile: No results for input(s): CHOL, HDL, LDLCALC, TRIG, CHOLHDL, LDLDIRECT in the last 72 hours. Anemia Panel: No results for input(s): VITAMINB12, FOLATE, FERRITIN, TIBC, IRON, RETICCTPCT in the last 72 hours. Urine analysis:    Component Value Date/Time   COLORURINE STRAW (A) 06/05/2020 0415    APPEARANCEUR CLEAR (A) 06/05/2020 0415   LABSPEC 1.001 (L) 06/05/2020 0415   PHURINE 6.0 06/05/2020 0415   GLUCOSEU NEGATIVE 06/05/2020 0415   HGBUR NEGATIVE 06/05/2020 0415   BILIRUBINUR NEGATIVE 06/05/2020 [place] 06/05/2020 0415   PROTEINUR NEGATIVE 06/05/2020 0415   NITRITE NEGATIVE 06/05/2020 0415   LEUKOCYTESUR NEGATIVE 06/05/2020 0415   Sepsis Labs: Invalid input(s): PROCALCITONIN, [place]  Microbiology: Recent Results (from the past 240 hour(s))  SARS Coronavirus 2 by RT PCR (hospital order, performed in [facility] hospital lab) Nasopharyngeal Nasopharyngeal Swab     Status: None   Collection Time: 06/05/20 11:38 AM   Specimen: Nasopharyngeal Swab  Result Value Ref Range Status   SARS Coronavirus 2 NEGATIVE NEGATIVE Final    Comment: (NOTE) SARS-CoV-2 target nucleic acids are NOT DETECTED.  The SARS-CoV-2 RNA is generally detectable in upper and lower respiratory specimens during the acute phase of infection. The lowest concentration of SARS-CoV-2 viral copies this assay can detect is 250 copies / mL. A negative result does not preclude SARS-CoV-2 infection and should not be used as the sole basis for treatment or other patient management decisions.  A negative result may occur with improper specimen collection / handling, submission of specimen other than nasopharyngeal swab, presence of viral mutation(s) within the areas targeted by this assay, and inadequate number of viral copies (<250 copies / mL). A negative result must be combined with clinical observations, patient history, and epidemiological information.  Fact Sheet for Patients:   StrictlyIdeas.no  Fact Sheet for Healthcare Providers: BankingDealers.co.za  This test is not yet approved or  cleared by the [place] FDA and has been authorized for detection and/or diagnosis of SARS-CoV-2 by FDA under an Emergency Use Authorization  (EUA).  This EUA will remain in effect (meaning this test can be used) for the duration of the COVID-19 declaration under Section 564(b)(1) of the Act, 21 U.S.C. section 360bbb-3(b)(1), unless the authorization is terminated or revoked sooner.  Performed at [facility], [address] [place], [place] [address]     Radiology Studies: No results found.     T. [place]  If 7PM-7AM, please contact night-coverage [email or web address] Password [email or web address] 06/12/2020, 1:45 PM

## 2020-06-12 NOTE — Progress Notes (Signed)
SLP Cancellation Note  Patient Details Name: Douglas Peterson. MRN: [id number] DOB: Dec 26, 1950   Cancelled treatment:       Reason Eval/Treat Not Completed: Fatigue/lethargy limiting ability to participate. Pt was drowsy this afternoon; sleeping soundly when SLP was in room. Will attempt to f/u at a meal for ongoing assessment and toleration of diet.     Orinda Kenner, [place], CCC-SLP , 06/12/2020, 3:48 PM

## 2020-06-12 NOTE — Progress Notes (Signed)
Patient alert and oriented x 3. With periods of confusion. He is more with it this shift than when this writer has had him previously . Pain well controlled. Continues to use urinal , 1 incontinence episode today of urine. Incontinence care provided. [name] able to make needs known. Does not use call bell , however when this writer comes to check on him , he is able to tell me his pain level and answer questions appropriately

## 2020-06-13 DIAGNOSIS — F329 Major depressive disorder, single episode, unspecified: Secondary | ICD-10-CM | POA: Diagnosis not present

## 2020-06-13 DIAGNOSIS — F10230 Alcohol dependence with withdrawal, uncomplicated: Secondary | ICD-10-CM | POA: Diagnosis not present

## 2020-06-13 DIAGNOSIS — S42031A Displaced fracture of lateral end of right clavicle, initial encounter for closed fracture: Secondary | ICD-10-CM | POA: Diagnosis not present

## 2020-06-13 DIAGNOSIS — W19XXXD Unspecified fall, subsequent encounter: Secondary | ICD-10-CM

## 2020-06-13 DIAGNOSIS — F10929 Alcohol use, unspecified with intoxication, unspecified: Secondary | ICD-10-CM | POA: Diagnosis not present

## 2020-06-13 NOTE — Care Management Important Message (Signed)
Important Message  Patient Details  Name: Douglas Peterson. MRN: [id number] Date of Birth: October 15, 1951   Medicare Important Message Given:  Yes     Loann Quill 06/13/2020, 11:08 AM

## 2020-06-13 NOTE — NC FL2 (Signed)
[place] LEVEL OF CARE SCREENING TOOL     IDENTIFICATION  Patient Name: Douglas Peterson. Birthdate: May 27, 1951 Sex: male Admission Date (Current Location): 06/05/2020  [place] and [place] Number:  Engineering geologist and Address:  [facility], [address], [place], [place] [address]      Provider Number: [id number]  Attending Physician Name and Address:  Sidney Ace, MD  Relative Name and Phone Number:       Current Level of Care: Hospital Recommended Level of Care: [place] Prior Approval Number:    Date Approved/Denied:   PASRR Number: [id number]  Discharge Plan: SNF    Current Diagnoses: Patient Active Problem List   Diagnosis Date Noted  . Closed fracture of distal clavicle_Right 06/05/2020  . GERD (gastroesophageal reflux disease) 06/05/2020  . Alcohol use disorder, severe, in early remission, dependence ([place]) 05/28/2020  . Acquired thrombophilia ([place])   . Elevated LFTs 04/06/2020  . Fall 04/06/2020  . Rhabdomyolysis 04/06/2020  . Elevated lactic acid level 04/06/2020  . Sepsis ([place]) 03/17/2020  . Constipation 03/17/2020  . Paroxysmal A-fib ([place]) 03/17/2020  . Severe protein-calorie malnutrition ([place]) 02/25/2020  . Left inguinal hernia 01/31/2020  . Encounter for competency evaluation   . Depression 01/17/2020  . Esophagitis   . Nausea without vomiting   . Weakness   . Tremor   . Pancytopenia ([place])   . Hypomagnesemia   . Alcoholic cirrhosis of liver without ascites ([place])   . Thrombocytopenia ([place])   . Non-traumatic rhabdomyolysis   . Alcohol withdrawal delirium ([place]) 12/08/2019  . ETOH abuse   . Alcoholic intoxication with complication ([place])   . 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 Hypokalemia 09/27/2019  . Dysphagia 09/27/2019  . Alcohol withdrawal delirium, acute, hyperactive ([place]) 09/21/2019  . Pressure injury of ankle, stage 1 08/15/2019   . Goals of care, counseling/discussion   . Palliative care by specialist   . Alcohol withdrawal ([place]) 03/20/2019  . Low back pain 11/15/2018  . Acute metabolic encephalopathy 92/09/9416  . Delirium tremens ([place]) 03/08/2018  . Malnutrition of moderate degree 11/24/2017  . HTN (hypertension) 09/16/2017  . Hypothermia 12/17/2016  . Alcohol use   . Diarrhea   . Hyponatremia 07/09/2015    Orientation RESPIRATION BLADDER Height & Weight     Self, Place, Time  Normal External catheter, Continent Weight:   Height:     BEHAVIORAL SYMPTOMS/MOOD NEUROLOGICAL BOWEL NUTRITION STATUS      Continent Diet (Dysphagia 2 with Nectar thick)  AMBULATORY STATUS COMMUNICATION OF NEEDS Skin   Extensive Assist Verbally PU Stage and Appropriate Care   PU Stage 2 Dressing: Daily (Stage 1)                   Personal Care Assistance Level of Assistance  Bathing, Feeding, Dressing Bathing Assistance: Maximum assistance Feeding assistance: Limited assistance Dressing Assistance: Maximum assistance     Functional Limitations Info             SPECIAL CARE FACTORS FREQUENCY  PT (By licensed PT), OT (By licensed OT)                    Contractures Contractures Info: Not present    Additional Factors Info  Code Status, Allergies Code Status Info: Full Allergies Info: NKDA           Current Medications (06/13/2020):  This is the current hospital active medication list Current  Facility-Administered Medications  Medication Dose Route Frequency Provider Last Rate Last Admin  . acetaminophen (TYLENOL) tablet 650 mg  650 mg Oral Q8H Wendee Beavers T, MD   650 mg at 06/13/20 0622  . ascorbic acid (VITAMIN C) tablet 1,000 mg  1,000 mg Oral Daily Ivor Costa, MD   1,000 mg at 06/12/20 0913  . aspirin EC tablet 81 mg  81 mg Oral Daily Ivor Costa, MD   81 mg at 06/12/20 0913  . atenolol (TENORMIN) tablet 25 mg  25 mg Oral BID Ivor Costa, MD   25 mg at 06/12/20 2012  . calcium-vitamin D (OSCAL  WITH D) 500-200 MG-UNIT per tablet 1 tablet  1 tablet Oral Q breakfast Ivor Costa, MD   1 tablet at 06/12/20 0913  . cholecalciferol (VITAMIN D) tablet 5,000 Units  5,000 Units Oral Daily Ivor Costa, MD   5,000 Units at 06/12/20 0913  . doxepin (SINEQUAN) capsule 10 mg  10 mg Oral QHS PRN Ivor Costa, MD   10 mg at 06/10/20 2320  . enoxaparin (LOVENOX) injection 40 mg  40 mg Subcutaneous Q24H Ivor Costa, MD   40 mg at 06/12/20 2012  . feeding supplement (NEPRO CARB STEADY) liquid 237 mL  237 mL Oral TID BM Wendee Beavers T, MD   237 mL at 06/12/20 2158  . folic acid (FOLVITE) tablet 1 mg  1 mg Oral Daily Wendee Beavers T, MD   1 mg at 06/12/20 0913  . hydrALAZINE (APRESOLINE) injection 5 mg  5 mg Intravenous Q2H PRN Ivor Costa, MD      . lidocaine (LIDODERM) 5 % 1 patch  1 patch Transdermal Q24H Ivor Costa, MD   1 patch at 06/12/20 0934  . multivitamin with minerals tablet 1 tablet  1 tablet Oral Daily Ivor Costa, MD   1 tablet at 06/12/20 0913  . ondansetron (ZOFRAN) injection 4 mg  4 mg Intravenous Q8H PRN Ivor Costa, MD      . oxyCODONE (Oxy IR/ROXICODONE) immediate release tablet 5 mg  5 mg Oral Q6H PRN Wendee Beavers T, MD   5 mg at 06/13/20 0145  . pantoprazole (PROTONIX) EC tablet 40 mg  40 mg Oral Daily Ivor Costa, MD   40 mg at 06/12/20 0913  . PARoxetine (PAXIL) tablet 30 mg  30 mg Oral Daily Ivor Costa, MD   30 mg at 06/12/20 0913  . polyethylene glycol (MIRALAX / GLYCOLAX) packet 17 g  17 g Oral BID PRN Wendee Beavers T, MD   17 g at 06/11/20 1436  . senna-docusate (Senokot-S) tablet 1 tablet  1 tablet Oral BID PRN Mercy Riding, MD   1 tablet at 06/08/20 1450  . thiamine tablet 100 mg  100 mg Oral Daily Ivor Costa, MD   100 mg at 06/12/20 1610   Or  . thiamine (B-1) injection 100 mg  100 mg Intravenous Daily Ivor Costa, MD         Discharge Medications: Please see discharge summary for a list of discharge medications.  Relevant Imaging Results:  Relevant Lab Results:   Additional  Information SS# [id number]  Shelbie Ammons, RN
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
## 2020-06-13 NOTE — TOC Progression Note (Signed)
Transition of Care (TOC) - Progression Note    Patient Details  Name: Douglas Peterson. MRN: [id number] Date of Birth: [birth year]  Transition of Care [facility]) CM/SW Contact  Shelbie Ammons, RN Phone Number: 06/13/2020, 10:23 AM  Clinical Narrative: RNCM spoke with [name] DSS by phone to relay that Psych consult had been completed. Roni reported that they are actively pursuing guardianship but at this point until she speaks with her supervisor she can not give any further information. Discussed that patient did relay to this CM that he want to to go to SNF "to help with my shoulder". Further that SNF work-up had been started but that no bed offers had been made at this point.  Per request of DSS faxed Psychiatry's last note to [phone] RNCM will continue to follow.            Expected Discharge Plan and Services                                                 Social Determinants of Health (SDOH) Interventions    Readmission Risk Interventions Readmission Risk Prevention Plan 05/19/2020 12/13/2019 12/12/2019  Transportation Screening Complete Complete Complete  PCP or Specialist Appt within 3-5 Days - (No Data) -  HRI or [place] - (No Data) -  Palliative Care Screening - Complete Complete  Medication Review (RN Care Manager) Complete Complete Complete  PCP or Specialist appointment within 3-5 days of discharge Complete - -  [place] or Home Care Consult Complete - -  SW Recovery Care/Counseling Consult Complete - -  Palliative Care Screening Not Applicable - -  [place] Not Applicable - -  Some recent data might be hidden

## 2020-06-13 NOTE — Progress Notes (Signed)
PROGRESS NOTE    Douglas Peterson.  WGN:[id number] DOB: 18-Apr-1951 DOA: 06/05/2020 PCP: [name], MD    Brief Narrative: 69 year old male with history of HTN, GERD, depression, A. fib, alcohol abuse chronic right-sided weakness presenting with right shoulder pain after fall at home, and admitted for alcohol intoxication/withdrawal, rhabdomyolysis and right distal clavicular fracture.  CT head without acute finding.  Psychiatry consulted to assess for capacity to determine guardianship by APS but was not able to assess due to mental status.  Psych consulted.  Waiting on SNF bed.  8/11: Patient seen and examined.  Lengthy conversation about continued hospitalization.  Patient expresses reasonable insight for his hospitalization and recurrent hospital visits.  Expresses motivation for rehabilitation and commits to not drinking.   Assessment & Plan:   Principal Problem:   Alcohol withdrawal ([place]) Active Problems:   Diarrhea   Pressure injury of ankle, stage 1   Alcoholic intoxication with complication (HCC)   ETOH abuse   Depression   Severe protein-calorie malnutrition (HCC)   Fall   Rhabdomyolysis   Closed fracture of distal clavicle_Right   GERD (gastroesophageal reflux disease)  Fall at home-likely due to alcohol intoxication. Closed fracture of right distal clavicle:  -Supportive care with sling and pain control -Scheduled Tylenol with as needed OxyIR.  -PT/OT: Recommend SNF.  Bed search in progress but no bed offers have been made.  Likely owing to the fact the patient has a history of leaving AMA from SNF's after 1 or 2 days  Alcoholic intoxication/withdrawal Reports drinking about 18 cans of beer a day.  -No further indication for CIWA monitoring -Encouraged cessation. -Continue vitamins and folic acid.  Intermittent confusion/encephalopathy likely delirium.  He is now oriented x4 but limited insight. -Reorientation and delirium precautions -Appreciate  psychiatric follow-up -Minimize sedating medications.  Transient respiratory distress: Resolved.  Concern about aspiration.  CXR without acute finding.  BNP slightly elevated to 133.  Appears euvolemic. -Aspiration precautions and dysphagia 2 diet.  Essential hypertension: BP slightly elevated. -Continue home atenolol and Cardura. -Continue as needed hydralazine -Discontinue ibuprofen  Dysphagia: Some concern about aspiration on 8/5. -Aspiration precaution. -Dysphagia 2 diet.  Hypomagnesemia: Likely due to alcohol.  Resolved.  Depression: Seems stable.  Denies SI/HI/audiovisual hallucination. -Continue home doxepin, Paxil  Traumatic rhabdomyolysis:CK 452> 227.  Likely due to alcohol and fall.  Improved.   GERD (gastroesophageal reflux disease) -Continue Protonix  Constipation: Resolved. -Bowel regimens.  Mild thrombocytopenia: Likely due to alcohol. -Continue monitoring  Social issues: Lives alone.  Drinks heavily.  Found lying in fecal matters when EMS arrived. -Agreed to go to SNF but known for changing his mind. -Psych reconsulted for capacity as requested by APS. -Patient does not have capacity -We will attempt to discharge to skilled nursing facility however if no bed offers were made we may need to consider alternative options  Debility/physical deconditioning -PT/OT  Severe protein-calorie malnutrition: As evidenced by significant muscle mass and subcu fat loss. -Nutrition supplement, Ensure -Appreciate input by dietitian. There is no height or weight on file to calculate BMI. Nutrition Problem: Severe Malnutrition Etiology: social / environmental circumstances (EtOH abuse) Signs/Symptoms: severe fat depletion, moderate muscle depletion, severe muscle depletion Interventions: Refer to RD note for recommendations     DVT prophylaxis: Lovenox Code Status: Full Family Communication: None today  disposition Plan: Status is: Inpatient  Remains  inpatient appropriate because:Unsafe d/c plan   Dispo: The patient is from: Home  Anticipated d/c is to: SNF              Anticipated d/c date is: 2 days              Patient currently is medically stable to d/c.  [name] currently medically stable however it seems that his previous living arrangements were unsafe.  Attempting to dispo to skilled nursing facility however at this time no bed offers have been made.   Consultants:   Psychiatry  Procedures:   None  Antimicrobials:  None   Subjective: Patient seen and examined.  Endorses some fatigue and weakness but otherwise asymptomatic.  Objective: Vitals:   06/12/20 0829 06/12/20 1635 06/13/20 0008 06/13/20 0738  BP: (!) 161/82 137/77 124/72 (!) 148/99  Pulse: (!) 53 69 72 63  Resp: 16 18 18 16   Temp: 97.7 F (36.5 C) 98 F (36.7 C) 98.1 F (36.7 C)   TempSrc:  Oral Oral   SpO2: 98% 92% 94% 93%    Intake/Output Summary (Last 24 hours) at 06/13/2020 1512 Last data filed at 06/13/2020 1013 Gross per 24 hour  Intake 120 ml  Output 750 ml  Net -630 ml   There were no vitals filed for this visit.  Examination:  General exam: Appears weak and deconditioned.  Chronically ill Respiratory system: Bibasilar crackles.  Normal work of breathing.  No oxygen requirement  cardiovascular system: S1 & S2 heard, RRR. No JVD, murmurs, rubs, gallops or clicks. No pedal edema. Gastrointestinal system: Abdomen is nondistended, soft and nontender. No organomegaly or masses felt. Normal bowel sounds heard. Central nervous system: Alert and oriented. No focal neurological deficits. Extremities: Symmetric 5 x 5 power.  Significant muscle wasting in bilateral upper and lower extremities Skin: No rashes, lesions or ulcers Psychiatry: Judgment impacted.  Flattened affect    Data Reviewed: I have personally reviewed following labs and imaging studies  CBC: Recent Labs  Lab 06/07/20 0626 06/11/20 0937  WBC 5.5 7.4   HGB 12.3* 13.5  HCT 36.5* 40.4  MCV 90.6 92.0  PLT 145* 329   Basic Metabolic Panel: Recent Labs  Lab 06/07/20 0626 06/08/20 0609 06/11/20 0830  NA 140 137 138  K 3.7 3.6 4.2  CL 108 100 101  CO2 24 28 26   GLUCOSE 92 88 82  BUN 10 10 17   CREATININE 0.50* 0.50* 0.54*  CALCIUM 8.5* 8.8* 9.5  MG 1.6* 1.8 2.1  PHOS 3.5 2.8 4.1   GFR: CrCl cannot be calculated (Unknown ideal weight.). Liver Function Tests: Recent Labs  Lab 06/07/20 0626 06/08/20 0609 06/11/20 0830  AST 26  --  28  ALT 22  --  21  ALKPHOS 69  --  76  BILITOT 0.9  --  0.8  PROT 6.0*  --  8.0  ALBUMIN 3.4* 3.8 4.3   No results for input(s): LIPASE, AMYLASE in the last 168 hours. No results for input(s): AMMONIA in the last 168 hours. Coagulation Profile: No results for input(s): INR, PROTIME in the last 168 hours. Cardiac Enzymes: No results for input(s): CKTOTAL, CKMB, CKMBINDEX, TROPONINI in the last 168 hours. BNP (last 3 results) No results for input(s): PROBNP in the last 8760 hours. HbA1C: No results for input(s): HGBA1C in the last 72 hours. CBG: No results for input(s): GLUCAP in the last 168 hours. Lipid Profile: No results for input(s): CHOL, HDL, LDLCALC, TRIG, CHOLHDL, LDLDIRECT in the last 72 hours. Thyroid Function Tests: No results for input(s): TSH, T4TOTAL, FREET4, T3FREE, THYROIDAB  in the last 72 hours. Anemia Panel: No results for input(s): VITAMINB12, FOLATE, FERRITIN, TIBC, IRON, RETICCTPCT in the last 72 hours. Sepsis Labs: No results for input(s): PROCALCITON, LATICACIDVEN in the last 168 hours.  Recent Results (from the past 240 hour(s))  SARS Coronavirus 2 by RT PCR (hospital order, performed in [facility] hospital lab) Nasopharyngeal Nasopharyngeal Swab     Status: None   Collection Time: 06/05/20 11:38 AM   Specimen: Nasopharyngeal Swab  Result Value Ref Range Status   SARS Coronavirus 2 NEGATIVE NEGATIVE Final    Comment: (NOTE) SARS-CoV-2 target nucleic acids  are NOT DETECTED.  The SARS-CoV-2 RNA is generally detectable in upper and lower respiratory specimens during the acute phase of infection. The lowest concentration of SARS-CoV-2 viral copies this assay can detect is 250 copies / mL. A negative result does not preclude SARS-CoV-2 infection and should not be used as the sole basis for treatment or other patient management decisions.  A negative result may occur with improper specimen collection / handling, submission of specimen other than nasopharyngeal swab, presence of viral mutation(s) within the areas targeted by this assay, and inadequate number of viral copies (<250 copies / mL). A negative result must be combined with clinical observations, patient history, and epidemiological information.  Fact Sheet for Patients:   StrictlyIdeas.no  Fact Sheet for Healthcare Providers: BankingDealers.co.za  This test is not yet approved or  cleared by the [place] FDA and has been authorized for detection and/or diagnosis of SARS-CoV-2 by FDA under an Emergency Use Authorization (EUA).  This EUA will remain in effect (meaning this test can be used) for the duration of the COVID-19 declaration under Section 564(b)(1) of the Act, 21 U.S.C. section 360bbb-3(b)(1), unless the authorization is terminated or revoked sooner.  Performed at [facility], [address] [place], [place] [address]          Radiology Studies: No results found.      Scheduled Meds: . acetaminophen  650 mg Oral Q8H  . ascorbic acid  1,000 mg Oral Daily  . aspirin EC  81 mg Oral Daily  . atenolol  25 mg Oral BID  . calcium-vitamin D  1 tablet Oral Q breakfast  . cholecalciferol  5,000 Units Oral Daily  . enoxaparin (LOVENOX) injection  40 mg Subcutaneous Q24H  . feeding supplement (NEPRO CARB STEADY)  237 mL Oral TID BM  . folic acid  1 mg Oral Daily  . lidocaine  1 patch Transdermal Q24H   . multivitamin with minerals  1 tablet Oral Daily  . pantoprazole  40 mg Oral Daily  . PARoxetine  30 mg Oral Daily  . thiamine  100 mg Oral Daily   Or  . thiamine  100 mg Intravenous Daily   Continuous Infusions:   LOS: 7 days    Time spent: 25 minutes    Sidney Ace, MD [facility] Pager [phone]  If 7PM-7AM, please contact night-coverage 06/13/2020, 3:12 PM

## 2020-06-14 DIAGNOSIS — F10929 Alcohol use, unspecified with intoxication, unspecified: Secondary | ICD-10-CM | POA: Diagnosis not present

## 2020-06-14 DIAGNOSIS — F10230 Alcohol dependence with withdrawal, uncomplicated: Secondary | ICD-10-CM | POA: Diagnosis not present

## 2020-06-14 DIAGNOSIS — S42031A Displaced fracture of lateral end of right clavicle, initial encounter for closed fracture: Secondary | ICD-10-CM | POA: Diagnosis not present

## 2020-06-14 DIAGNOSIS — F329 Major depressive disorder, single episode, unspecified: Secondary | ICD-10-CM | POA: Diagnosis not present

## 2020-06-14 MED ORDER — BACLOFEN 10 MG PO TABS
5.0000 mg | ORAL_TABLET | Freq: Three times a day (TID) | ORAL | Status: DC | PRN
  Administered 2020-06-14 – 2020-06-25 (×9): 5 mg via ORAL
  Filled 2020-06-14 (×12): qty 0.5

## 2020-06-14 NOTE — TOC Progression Note (Signed)
Transition of Care (TOC) - Progression Note    Patient Details  Name: Douglas Peterson. MRN: [id number] Date of Birth: August 29, 1951  Transition of Care [facility]) CM/SW Contact  Candie Chroman, LCSW Phone Number: 06/14/2020, 9:15 AM  Clinical Narrative:  All facilities within [facility] have declined bed offer. CSW extended search to surrounding counties.   Expected Discharge Plan and Services                                                 Social Determinants of Health (SDOH) Interventions    Readmission Risk Interventions Readmission Risk Prevention Plan 05/19/2020 12/13/2019 12/12/2019  Transportation Screening Complete Complete Complete  PCP or Specialist Appt within 3-5 Days - (No Data) -  HRI or [place] - (No Data) -  Palliative Care Screening - Complete Complete  Medication Review (RN Care Manager) Complete Complete Complete  PCP or Specialist appointment within 3-5 days of discharge Complete - -  [place] or Home Care Consult Complete - -  SW Recovery Care/Counseling Consult Complete - -  Palliative Care Screening Not Applicable - -  [place] Not Applicable - -  Some recent data might be hidden

## 2020-06-14 NOTE — Progress Notes (Signed)
SLP F/U Note  Patient Details Name: Douglas Peterson. MRN: [id number] DOB: [birth year]   Cancelled treatment:       Reason Eval/Treat Not Completed:  (chart reviewed). Pt is progressing w/ PT; his goals are to return home. Noted SW note re: declined bed offers.  Due to pt's long history of dysphagia, pt could benefit from another repeat MBSS in order to assess his swallowing function and to again determine least restrictive diet consistency. Will order for MBSS. MD/NSG updated.     Orinda Kenner, MS, CCC-SLP , 06/14/2020, 4:35 PM
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
## 2020-06-14 NOTE — Progress Notes (Signed)
PROGRESS NOTE    Douglas Peterson.  ZDG:[id number] DOB: 04-25-1951 DOA: 06/05/2020 PCP: [name], MD    Brief Narrative: 69 year old male with history of HTN, GERD, depression, A. fib, alcohol abuse chronic right-sided weakness presenting with right shoulder pain after fall at home, and admitted for alcohol intoxication/withdrawal, rhabdomyolysis and right distal clavicular fracture.  CT head without acute finding.  Psychiatry consulted to assess for capacity to determine guardianship by APS but was not able to assess due to mental status.  Psych consulted.  Waiting on SNF bed.  8/11: Patient seen and examined.  Lengthy conversation about continued hospitalization.  Patient expresses reasonable insight for his hospitalization and recurrent hospital visits.  Expresses motivation for rehabilitation and commits to not drinking.  8/12: Patient seen and examined.  This morning patient expresses desire to return back home.  I told him this may or may not be an option as he currently is determined not to have capacity to make his medical decisions.  Also communicated this with case management.  All areas skilled nursing facilities have declined.  Disposition plan unclear.   Assessment & Plan:   Principal Problem:   Alcohol withdrawal ([place]) Active Problems:   Diarrhea   Pressure injury of ankle, stage 1   Alcoholic intoxication with complication (HCC)   ETOH abuse   Depression   Severe protein-calorie malnutrition (HCC)   Fall   Rhabdomyolysis   Closed fracture of distal clavicle_Right   GERD (gastroesophageal reflux disease)  Fall at home-likely due to alcohol intoxication. Closed fracture of right distal clavicle:  -Supportive care with sling and pain control -Scheduled Tylenol with as needed OxyIR.  -PT/OT: Recommend SNF.  Bed search in progress but no bed offers have been made.  Likely owing to the fact the patient has a history of leaving AMA from SNF's after 1 or 2 days -Patient  desires to return back home however this is also not a safe disposition plan.  Per psychiatry patient does not have capacity to make his own medical decisions this discharge home would be unsafe plan.  Disposition plan unclear at this time.  Alcoholic intoxication/withdrawal Reports drinking about 18 cans of beer a day.  -No further indication for CIWA monitoring -Encouraged cessation. -Continue vitamins and folic acid.  Intermittent confusion/encephalopathy likely delirium.  He is now oriented x4 but limited insight. -Reorientation and delirium precautions -Appreciate psychiatric follow-up -Minimize sedating medications.  Transient respiratory distress: Resolved.  Concern about aspiration.  CXR without acute finding.  BNP slightly elevated to 133.  Appears euvolemic. -Aspiration precautions and dysphagia 2 diet.  Essential hypertension: BP slightly elevated. -Continue home atenolol and Cardura. -Continue as needed hydralazine -Discontinue ibuprofen  Dysphagia: Some concern about aspiration on 8/5. -Aspiration precaution. -Dysphagia 2 diet.  Hypomagnesemia: Likely due to alcohol.  Resolved.  Depression: Seems stable.  Denies SI/HI/audiovisual hallucination. -Continue home doxepin, Paxil  Traumatic rhabdomyolysis:CK 452> 227.  Likely due to alcohol and fall.  Improved.   GERD (gastroesophageal reflux disease) -Continue Protonix  Constipation: Resolved. -Bowel regimens.  Mild thrombocytopenia: Likely due to alcohol. -Continue monitoring  Social issues: Lives alone.  Drinks heavily.  Found lying in fecal matters when EMS arrived. -Agreed to go to SNF but known for changing his mind. -Psych reconsulted for capacity as requested by APS. -Patient does not have capacity -We will attempt to discharge to skilled nursing facility however if no bed offers were made we may need to consider alternative options  Debility/physical deconditioning -PT/OT  Severe  protein-calorie malnutrition: As evidenced by significant muscle mass and subcu fat loss. -Nutrition supplement, Ensure -Appreciate input by dietitian. There is no height or weight on file to calculate BMI. Nutrition Problem: Severe Malnutrition Etiology: social / environmental circumstances (EtOH abuse) Signs/Symptoms: severe fat depletion, moderate muscle depletion, severe muscle depletion Interventions: Refer to RD note for recommendations     DVT prophylaxis: Lovenox Code Status: Full Family Communication: None today  disposition Plan: Status is: Inpatient  Remains inpatient appropriate because:Unsafe d/c plan   Dispo: The patient is from: Home              Anticipated d/c is to: SNF              Anticipated d/c date is: 2 days              Patient currently is medically stable to d/c.  [name] currently medically stable however it seems that his previous living arrangements were unsafe.  Attempting to dispo to skilled nursing facility however at this time no bed offers have been made.   Consultants:   Psychiatry  Procedures:   None  Antimicrobials:  None   Subjective: Patient seen and examined.  Endorses some fatigue and weakness but otherwise asymptomatic.  Objective: Vitals:   06/13/20 0738 06/13/20 1545 06/14/20 0112 06/14/20 0746  BP: (!) 148/99 (!) 148/88 (!) 144/86 (!) 151/79  Pulse: 63 67 64 61  Resp: 16 16 16 15   Temp:  (!) 97.5 F (36.4 C) (!) 97.5 F (36.4 C) 97.6 F (36.4 C)  TempSrc:  Oral Oral Oral  SpO2: 93% 95% 98% 95%    Intake/Output Summary (Last 24 hours) at 06/14/2020 1410 Last data filed at 06/14/2020 1358 Gross per 24 hour  Intake 480 ml  Output 625 ml  Net -145 ml   There were no vitals filed for this visit.  Examination:  General exam: Appears weak and deconditioned.  Chronically ill Respiratory system: Bibasilar crackles.  Normal work of breathing.  No oxygen requirement  cardiovascular system: S1 & S2 heard, RRR. No  JVD, murmurs, rubs, gallops or clicks. No pedal edema. Gastrointestinal system: Abdomen is nondistended, soft and nontender. No organomegaly or masses felt. Normal bowel sounds heard. Central nervous system: Alert and oriented. No focal neurological deficits. Extremities: Symmetric 5 x 5 power.  Significant muscle wasting in bilateral upper and lower extremities Skin: No rashes, lesions or ulcers Psychiatry: Judgment impacted.  Flattened affect    Data Reviewed: I have personally reviewed following labs and imaging studies  CBC: Recent Labs  Lab 06/11/20 0937  WBC 7.4  HGB 13.5  HCT 40.4  MCV 92.0  PLT 202   Basic Metabolic Panel: Recent Labs  Lab 06/08/20 0609 06/11/20 0830  NA 137 138  K 3.6 4.2  CL 100 101  CO2 28 26  GLUCOSE 88 82  BUN 10 17  CREATININE 0.50* 0.54*  CALCIUM 8.8* 9.5  MG 1.8 2.1  PHOS 2.8 4.1   GFR: CrCl cannot be calculated (Unknown ideal weight.). Liver Function Tests: Recent Labs  Lab 06/08/20 0609 06/11/20 0830  AST  --  28  ALT  --  21  ALKPHOS  --  76  BILITOT  --  0.8  PROT  --  8.0  ALBUMIN 3.8 4.3   No results for input(s): LIPASE, AMYLASE in the last 168 hours. No results for input(s): AMMONIA in the last 168 hours. Coagulation Profile: No results for input(s): INR, PROTIME in the last  168 hours. Cardiac Enzymes: No results for input(s): CKTOTAL, CKMB, CKMBINDEX, TROPONINI in the last 168 hours. BNP (last 3 results) No results for input(s): PROBNP in the last 8760 hours. HbA1C: No results for input(s): HGBA1C in the last 72 hours. CBG: No results for input(s): GLUCAP in the last 168 hours. Lipid Profile: No results for input(s): CHOL, HDL, LDLCALC, TRIG, CHOLHDL, LDLDIRECT in the last 72 hours. Thyroid Function Tests: No results for input(s): TSH, T4TOTAL, FREET4, T3FREE, THYROIDAB in the last 72 hours. Anemia Panel: No results for input(s): VITAMINB12, FOLATE, FERRITIN, TIBC, IRON, RETICCTPCT in the last 72  hours. Sepsis Labs: No results for input(s): PROCALCITON, LATICACIDVEN in the last 168 hours.  Recent Results (from the past 240 hour(s))  SARS Coronavirus 2 by RT PCR (hospital order, performed in [facility] hospital lab) Nasopharyngeal Nasopharyngeal Swab     Status: None   Collection Time: 06/05/20 11:38 AM   Specimen: Nasopharyngeal Swab  Result Value Ref Range Status   SARS Coronavirus 2 NEGATIVE NEGATIVE Final    Comment: (NOTE) SARS-CoV-2 target nucleic acids are NOT DETECTED.  The SARS-CoV-2 RNA is generally detectable in upper and lower respiratory specimens during the acute phase of infection. The lowest concentration of SARS-CoV-2 viral copies this assay can detect is 250 copies / mL. A negative result does not preclude SARS-CoV-2 infection and should not be used as the sole basis for treatment or other patient management decisions.  A negative result may occur with improper specimen collection / handling, submission of specimen other than nasopharyngeal swab, presence of viral mutation(s) within the areas targeted by this assay, and inadequate number of viral copies (<250 copies / mL). A negative result must be combined with clinical observations, patient history, and epidemiological information.  Fact Sheet for Patients:   StrictlyIdeas.no  Fact Sheet for Healthcare Providers: BankingDealers.co.za  This test is not yet approved or  cleared by the [place] FDA and has been authorized for detection and/or diagnosis of SARS-CoV-2 by FDA under an Emergency Use Authorization (EUA).  This EUA will remain in effect (meaning this test can be used) for the duration of the COVID-19 declaration under Section 564(b)(1) of the Act, 21 U.S.C. section 360bbb-3(b)(1), unless the authorization is terminated or revoked sooner.  Performed at [facility], [address] [place], [place] [address]           Radiology Studies: No results found.      Scheduled Meds:  acetaminophen  650 mg Oral Q8H   ascorbic acid  1,000 mg Oral Daily   aspirin EC  81 mg Oral Daily   atenolol  25 mg Oral BID   calcium-vitamin D  1 tablet Oral Q breakfast   cholecalciferol  5,000 Units Oral Daily   enoxaparin (LOVENOX) injection  40 mg Subcutaneous Q24H   feeding supplement (NEPRO CARB STEADY)  237 mL Oral TID BM   folic acid  1 mg Oral Daily   lidocaine  1 patch Transdermal Q24H   multivitamin with minerals  1 tablet Oral Daily   pantoprazole  40 mg Oral Daily   PARoxetine  30 mg Oral Daily   thiamine  100 mg Oral Daily   Or   thiamine  100 mg Intravenous Daily   Continuous Infusions:   LOS: 8 days    Time spent: 15 minutes    Sidney Ace, MD [facility] Pager [phone]  If 7PM-7AM, please contact night-coverage 06/14/2020, 2:10 PM

## 2020-06-15 ENCOUNTER — Inpatient Hospital Stay: Payer: Medicare HMO

## 2020-06-15 LAB — CREATININE, SERUM
Creatinine, Ser: 0.56 mg/dL — ABNORMAL LOW (ref 0.61–1.24)
GFR calc Af Amer: >60 mL/min (ref >60)
GFR calc non Af Amer: >60 mL/min (ref >60)

## 2020-06-15 NOTE — Care Management Important Message (Signed)
Important Message  Patient Details  Name: Douglas Peterson. MRN: [id number] Date of Birth: Aug 13, 1951   Medicare Important Message Given:  Yes     Loann Quill 06/15/2020, 1:24 PM

## 2020-06-15 NOTE — TOC Progression Note (Addendum)
Transition of Care (TOC) - Progression Note    Patient Details  Name: Burwell Bethel. MRN: [id number] Date of Birth: 18-Aug-1951  Transition of Care [facility]) CM/SW Contact  Candie Chroman, LCSW Phone Number: 06/15/2020, 9:34 AM  Clinical Narrative: Patient has a bed offer from [facility] in [place]. Left voicemail for admissions coordinator to confirm.    1:17 pm: CSW spoke with [place] at [facility]. She confirmed bed offer. Left voicemail for APS worker to see if daughter would be decision maker at this point.  Expected Discharge Plan and Services                                                 Social Determinants of Health (SDOH) Interventions    Readmission Risk Interventions Readmission Risk Prevention Plan 05/19/2020 12/13/2019 12/12/2019  Transportation Screening Complete Complete Complete  PCP or Specialist Appt within 3-5 Days - (No Data) -  HRI or [place] - (No Data) -  Palliative Care Screening - Complete Complete  Medication Review (RN Care Manager) Complete Complete Complete  PCP or Specialist appointment within 3-5 days of discharge Complete - -  [place] or Home Care Consult Complete - -  SW Recovery Care/Counseling Consult Complete - -  Palliative Care Screening Not Applicable - -  [place] Not Applicable - -  Some recent data might be hidden

## 2020-06-15 NOTE — Progress Notes (Addendum)
Modified Barium Swallow Progress Note  Patient Details  Name: Douglas Peterson. MRN: [id number] Date of Birth: May 19, 1951  Today's Date: 06/15/2020  Modified Barium Swallow completed.  Full report located under Chart Review in the Imaging Section.  Brief recommendations include the following:  Clinical Impression  Pt presents with mild sensori-motor pharyngeal dysphagia. [name] swallow initiation is delayed to the pyriform sinuses, however he has significant residue post swallow throughout the pharynx. Residue increases with thicker textures/consistencies. Therefore after consuming bites of puree, pt penetrated thin liquids as the liquids were not able to held within the vallecula and pyriform sinuses d/t residue left by puree. Multiple swallows were not effective in clearing pharyngeal residue. However, a cued throat clear was able to clear the penetrates. While nectar was not penetrated, it left more pharyngeal residue which could potentially be aspirated. At this time recommend regular with thin liquids via cup (NO STRAW), medicine whole in thin liquids.    Swallow Evaluation Recommendations       SLP Diet Recommendations: Dysphagia 2 (Fine chop) solids;Thin liquid   Liquid Administration via: Cup;No straw   Medication Administration: Whole meds with liquid   Supervision: Patient able to self feed;Intermittent supervision to cue for compensatory strategies   Compensations: Minimize environmental distractions;Slow rate;Small sips/bites   Postural Changes: Seated upright at 90 degrees   Oral Care Recommendations: Oral care BID        B. Rutherford Nail M.S., CCC-SLP, [place] [facility] Rehabilitation Services Office [phone]   Rutherford Nail 06/15/2020,2:54 PM

## 2020-06-15 NOTE — Progress Notes (Signed)
PROGRESS NOTE    Douglas Peterson.  VQM:[id number] DOB: 14-Apr-1951 DOA: 06/05/2020 PCP: [name], MD    Brief Narrative: 69 year old male with history of HTN, GERD, depression, A. fib, alcohol abuse chronic right-sided weakness presenting with right shoulder pain after fall at home, and admitted for alcohol intoxication/withdrawal, rhabdomyolysis and right distal clavicular fracture.  CT head without acute finding.  Psychiatry consulted to assess for capacity to determine guardianship by APS but was not able to assess due to mental status.  Psych consulted.  Waiting on SNF bed.  8/11: Patient seen and examined.  Lengthy conversation about continued hospitalization.  Patient expresses reasonable insight for his hospitalization and recurrent hospital visits.  Expresses motivation for rehabilitation and commits to not drinking.  8/12: Patient seen and examined.  This morning patient expresses desire to return back home.  I told him this may or may not be an option as he currently is determined not to have capacity to make his medical decisions.  Also communicated this with case management.  All areas skilled nursing facilities have declined.  Disposition plan unclear.  8/13: Patient seen and examined.  Endorses poor sleep.  Desires to leave the hospital.  Explained that cannot safely discharged home as he was determined not to have capacity.  Per case management the patient may have a bed offer from a skilled nursing facility in [place].  Disposition plan pending.   Assessment & Plan:   Principal Problem:   Alcohol withdrawal ([place]) Active Problems:   Diarrhea   Pressure injury of ankle, stage 1   Alcoholic intoxication with complication (HCC)   ETOH abuse   Depression   Severe protein-calorie malnutrition (HCC)   Fall   Rhabdomyolysis   Closed fracture of distal clavicle_Right   GERD (gastroesophageal reflux disease)  Fall at home-likely due to alcohol intoxication. Closed  fracture of right distal clavicle:  -Supportive care with sling and pain control -Scheduled Tylenol with as needed OxyIR.  -PT/OT: Recommend SNF.  Bed search in progress but no bed offers have been made.  Likely owing to the fact the patient has a history of leaving AMA from SNF's after 1 or 2 days -Patient desires to return back home however this is also not a safe disposition plan.  Per psychiatry patient does not have capacity to make his own medical decisions this discharge home would be unsafe plan.  Disposition plan unclear at this time. -Per case management patient may have a accepting facility in [place].  TOC to follow-up  Dysphagia: Some concern about aspiration on 8/5. -Aspiration precaution. -Dysphagia 2 diet. -Patient has made progress and a swallowing ability may have improved.  Per SLP recs modified barium swallow ordered for today 9/32  Alcoholic intoxication/withdrawal Reports drinking about 18 cans of beer a day.  -No further indication for CIWA monitoring -Encouraged cessation. -Continue vitamins and folic acid.  Intermittent confusion/encephalopathy likely delirium.  He is now oriented x4 but limited insight. -Reorientation and delirium precautions -Appreciate psychiatric follow-up -Minimize sedating medications.  Transient respiratory distress: Resolved.  Concern about aspiration.  CXR without acute finding.  BNP slightly elevated to 133.  Appears euvolemic. -Aspiration precautions and dysphagia 2 diet.  Essential hypertension: BP slightly elevated. -Continue home atenolol and Cardura. -Continue as needed hydralazine -Discontinue ibuprofen   Hypomagnesemia: Likely due to alcohol.  Resolved.  Depression: Seems stable.  Denies SI/HI/audiovisual hallucination. -Continue home doxepin, Paxil  Traumatic rhabdomyolysis:CK 452> 227.  Likely due to alcohol and fall.  Improved.  GERD (gastroesophageal reflux disease) -Continue  Protonix  Constipation: Resolved. -Bowel regimens.  Mild thrombocytopenia: Likely due to alcohol. -Continue monitoring  Social issues: Lives alone.  Drinks heavily.  Found lying in fecal matters when EMS arrived. -Agreed to go to SNF but known for changing his mind. -Psych reconsulted for capacity as requested by APS. -Patient does not have capacity -We will attempt to discharge to skilled nursing facility however if no bed offers were made we may need to consider alternative options  Debility/physical deconditioning -PT/OT  Severe protein-calorie malnutrition: As evidenced by significant muscle mass and subcu fat loss. -Nutrition supplement, Ensure -Appreciate input by dietitian. There is no height or weight on file to calculate BMI. Nutrition Problem: Severe Malnutrition Etiology: social / environmental circumstances (EtOH abuse) Signs/Symptoms: severe fat depletion, moderate muscle depletion, severe muscle depletion Interventions: Refer to RD note for recommendations     DVT prophylaxis: Lovenox Code Status: Full Family Communication: None today  disposition Plan: Status is: Inpatient  Remains inpatient appropriate because:Unsafe d/c plan   Dispo: The patient is from: Home              Anticipated d/c is to: SNF              Anticipated d/c date is: 2 days              Patient currently is medically stable to d/c.   Also discharge plan.  Patient expresses desire to return back home however given his poor self-care and his frequent hospitalizations home is not a safe disposition.  Unfortunately the majority the skilled nursing facilities in the area have declined.  They may have an accepting facility in [place].  TOC to follow-up.   Consultants:   Psychiatry  Procedures:   None  Antimicrobials:  None   Subjective: Patient seen and examined.  Endorses poor sleep.  No pain complaints. Objective: Vitals:   06/14/20 0746 06/14/20 1620 06/14/20 2258  06/15/20 0730  BP: (!) 151/79 (!) 151/83 (!) 151/87 139/78  Pulse: 61 73 74 65  Resp: 15 18 16 16   Temp: 97.6 F (36.4 C) 97.9 F (36.6 C) 97.9 F (36.6 C) 97.7 F (36.5 C)  TempSrc: Oral Oral Oral Oral  SpO2: 95% 95% 96% 94%    Intake/Output Summary (Last 24 hours) at 06/15/2020 1056 Last data filed at 06/15/2020 1015 Gross per 24 hour  Intake 1134 ml  Output 525 ml  Net 609 ml   There were no vitals filed for this visit.  Examination:  General: No apparent distress.  Appears chronically ill HEENT: Normocephalic, atraumatic Neck, supple, trachea midline, no tenderness Heart: Regular rate and rhythm, S1/S2 normal, no murmurs Lungs: Clear to auscultation bilaterally, no adventitious sounds, normal work of breathing Abdomen: Soft, nontender, nondistended, positive bowel sounds Extremities: Normal, atraumatic, no clubbing or cyanosis, decreased muscle tone Skin: No rashes or lesions, pale color Neurologic: Cranial nerves grossly intact, sensation intact, alert and oriented x3 Psychiatric: Flattened affect     Data Reviewed: I have personally reviewed following labs and imaging studies  CBC: Recent Labs  Lab 06/11/20 0937  WBC 7.4  HGB 13.5  HCT 40.4  MCV 92.0  PLT 099   Basic Metabolic Panel: Recent Labs  Lab 06/11/20 0830 06/15/20 0517  NA 138  --   K 4.2  --   CL 101  --   CO2 26  --   GLUCOSE 82  --   BUN 17  --   CREATININE  0.54* 0.56*  CALCIUM 9.5  --   MG 2.1  --   PHOS 4.1  --    GFR: CrCl cannot be calculated (Unknown ideal weight.). Liver Function Tests: Recent Labs  Lab 06/11/20 0830  AST 28  ALT 21  ALKPHOS 76  BILITOT 0.8  PROT 8.0  ALBUMIN 4.3   No results for input(s): LIPASE, AMYLASE in the last 168 hours. No results for input(s): AMMONIA in the last 168 hours. Coagulation Profile: No results for input(s): INR, PROTIME in the last 168 hours. Cardiac Enzymes: No results for input(s): CKTOTAL, CKMB, CKMBINDEX, TROPONINI in  the last 168 hours. BNP (last 3 results) No results for input(s): PROBNP in the last 8760 hours. HbA1C: No results for input(s): HGBA1C in the last 72 hours. CBG: No results for input(s): GLUCAP in the last 168 hours. Lipid Profile: No results for input(s): CHOL, HDL, LDLCALC, TRIG, CHOLHDL, LDLDIRECT in the last 72 hours. Thyroid Function Tests: No results for input(s): TSH, T4TOTAL, FREET4, T3FREE, THYROIDAB in the last 72 hours. Anemia Panel: No results for input(s): VITAMINB12, FOLATE, FERRITIN, TIBC, IRON, RETICCTPCT in the last 72 hours. Sepsis Labs: No results for input(s): PROCALCITON, LATICACIDVEN in the last 168 hours.  Recent Results (from the past 240 hour(s))  SARS Coronavirus 2 by RT PCR (hospital order, performed in [facility] hospital lab) Nasopharyngeal Nasopharyngeal Swab     Status: None   Collection Time: 06/05/20 11:38 AM   Specimen: Nasopharyngeal Swab  Result Value Ref Range Status   SARS Coronavirus 2 NEGATIVE NEGATIVE Final    Comment: (NOTE) SARS-CoV-2 target nucleic acids are NOT DETECTED.  The SARS-CoV-2 RNA is generally detectable in upper and lower respiratory specimens during the acute phase of infection. The lowest concentration of SARS-CoV-2 viral copies this assay can detect is 250 copies / mL. A negative result does not preclude SARS-CoV-2 infection and should not be used as the sole basis for treatment or other patient management decisions.  A negative result may occur with improper specimen collection / handling, submission of specimen other than nasopharyngeal swab, presence of viral mutation(s) within the areas targeted by this assay, and inadequate number of viral copies (<250 copies / mL). A negative result must be combined with clinical observations, patient history, and epidemiological information.  Fact Sheet for Patients:   StrictlyIdeas.no  Fact Sheet for Healthcare  Providers: BankingDealers.co.za  This test is not yet approved or  cleared by the [place] FDA and has been authorized for detection and/or diagnosis of SARS-CoV-2 by FDA under an Emergency Use Authorization (EUA).  This EUA will remain in effect (meaning this test can be used) for the duration of the COVID-19 declaration under Section 564(b)(1) of the Act, 21 U.S.C. section 360bbb-3(b)(1), unless the authorization is terminated or revoked sooner.  Performed at [facility], [address], [place], [place] [address]          Radiology Studies: No results found.      Scheduled Meds:  acetaminophen  650 mg Oral Q8H   ascorbic acid  1,000 mg Oral Daily   aspirin EC  81 mg Oral Daily   atenolol  25 mg Oral BID   calcium-vitamin D  1 tablet Oral Q breakfast   cholecalciferol  5,000 Units Oral Daily   enoxaparin (LOVENOX) injection  40 mg Subcutaneous Q24H   feeding supplement (NEPRO CARB STEADY)  237 mL Oral TID BM   folic acid  1 mg Oral Daily   lidocaine  1 patch Transdermal Q24H   multivitamin with minerals  1 tablet Oral Daily   pantoprazole  40 mg Oral Daily   PARoxetine  30 mg Oral Daily   thiamine  100 mg Oral Daily   Or   thiamine  100 mg Intravenous Daily   Continuous Infusions:   LOS: 9 days    Time spent: 15 minutes    Sidney Ace, MD [facility] Pager [phone]  If 7PM-7AM, please contact night-coverage 06/15/2020, 10:56 AM
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
## 2020-06-17 NOTE — Plan of Care (Signed)
  Problem: Education: Goal: Knowledge of General Education information will improve Description Including pain rating scale, medication(s)/side effects and non-pharmacologic comfort measures Outcome: Progressing   

## 2020-06-17 NOTE — TOC Progression Note (Signed)
Transition of Care (TOC) - Progression Note    Patient Details  Name: Douglas Peterson. MRN: [id number] Date of Birth: [birth year]  Transition of Care [facility]) CM/SW Contact  Boris Sharper, LCSW Phone Number: 06/17/2020, 1:45 PM  Clinical Narrative:    CSW called pt's daughter, no answer, left Voicemail        Expected Discharge Plan and Services                                                 Social Determinants of Health (SDOH) Interventions    Readmission Risk Interventions Readmission Risk Prevention Plan 05/19/2020 12/13/2019 12/12/2019  Transportation Screening Complete Complete Complete  PCP or Specialist Appt within 3-5 Days - (No Data) -  HRI or [place] - (No Data) -  Palliative Care Screening - Complete Complete  Medication Review (RN Care Manager) Complete Complete Complete  PCP or Specialist appointment within 3-5 days of discharge Complete - -  [place] or Home Care Consult Complete - -  SW Recovery Care/Counseling Consult Complete - -  Palliative Care Screening Not Applicable - -  [place] Not Applicable - -  Some recent data might be hidden

## 2020-06-17 NOTE — Plan of Care (Signed)
  Problem: Education: Goal: Knowledge of General Education information will improve Description: Including pain rating scale, medication(s)/side effects and non-pharmacologic comfort measures Outcome: Progressing   Problem: Health Behavior/Discharge Planning: Goal: Ability to manage health-related needs will improve Outcome: Progressing   Problem: Clinical Measurements: Goal: Ability to maintain clinical measurements within normal limits will improve Outcome: Progressing Goal: Will remain free from infection Outcome: Progressing Goal: Respiratory complications will improve Outcome: Progressing Goal: Cardiovascular complication will be avoided Outcome: Progressing   Problem: Activity: Goal: Risk for activity intolerance will decrease Outcome: Progressing   Problem: Nutrition: Goal: Adequate nutrition will be maintained Outcome: Progressing   Problem: Coping: Goal: Level of anxiety will decrease Outcome: Progressing

## 2020-06-18 NOTE — Care Management Important Message (Signed)
Important Message  Patient Details  Name: Douglas Peterson. MRN: [id number] Date of Birth: [birth year]   Medicare Important Message Given:  Yes     Loann Quill 06/18/2020, 11:27 AM

## 2020-06-18 NOTE — Progress Notes (Signed)
PROGRESS NOTE    Douglas Peterson.  JKD:[id number] DOB: [birth year] DOA: 06/05/2020 PCP: [name], MD    Brief Narrative: 69 year old male with history of HTN, GERD, depression, A. fib, alcohol abuse chronic right-sided weakness presenting with right shoulder pain after fall at home, and admitted for alcohol intoxication/withdrawal, rhabdomyolysis and right distal clavicular fracture.  CT head without acute finding.  Psychiatry consulted to assess for capacity to determine guardianship by APS but was not able to assess due to mental status.  Psych consulted.  Waiting on SNF bed.  8/11: Patient seen and examined.  Lengthy conversation about continued hospitalization.  Patient expresses reasonable insight for his hospitalization and recurrent hospital visits.  Expresses motivation for rehabilitation and commits to not drinking.  8/12: Patient seen and examined.  This morning patient expresses desire to return back home.  I told him this may or may not be an option as he currently is determined not to have capacity to make his medical decisions.  Also communicated this with case management.  All areas skilled nursing facilities have declined.  Disposition plan unclear.  8/13: Patient seen and examined.  Endorses poor sleep.  Desires to leave the hospital.  Explained that cannot safely discharged home as he was determined not to have capacity.  Per case management the patient may have a bed offer from a skilled nursing facility in [place].  Disposition plan pending.  8/14: Patient seen and examined.  Continues to desire to leave the hospital.  Explained that he cannot be safely discharged home.  Apparently there is a facility in [place] that may be interested in accepting him. Case management is aware.  Working on placement.  8/15: Patient seen and examined.  Per case manager notes from 06/15/2020 he has a bed offer from [facility] in [place].  TOC social worker Judson Roch was working on  possible disposition.  No changes from clinical standpoint.  8/16: Patient seen and examined.  We are still pending disposition to skilled nursing facility.  DSS and the patient's daughter have been contacted.  Still do not have a authorization for patient to go to this facility.  On my interview today patient states that he wants to go back home.  I explained that while I would like to send him back home because of his recurrent admissions and his lack of capacity we are unable to do so.   Assessment & Plan:   Principal Problem:   Alcohol withdrawal ([place]) Active Problems:   Diarrhea   Pressure injury of ankle, stage 1   Alcoholic intoxication with complication (HCC)   ETOH abuse   Depression   Severe protein-calorie malnutrition (HCC)   Fall   Rhabdomyolysis   Closed fracture of distal clavicle_Right   GERD (gastroesophageal reflux disease)  Fall at home-likely due to alcohol intoxication. Closed fracture of right distal clavicle:  -Supportive care with sling and pain control -Scheduled Tylenol with as needed OxyIR.  -PT/OT: Recommend SNF.  Bed search in progress but no bed offers have been made.  Likely owing to the fact the patient has a history of leaving AMA from SNF's after 1 or 2 days -Patient desires to return back home however this is also not a safe disposition plan.  Per psychiatry patient does not have capacity to make his own medical decisions this discharge home would be unsafe plan.  Disposition plan unclear at this time. -Per case management patient may have a accepting facility in [place].  TOC to follow-up -Patient  does not want to go to facility in [place].  Unfortunately may not have a choice.  Someone will need to approve his transfer to the SNF to either his daughter or DSS.  Dysphagia Some concern about aspiration on 8/5. -Aspiration precaution. -Patient made progress and his dysphagia and swallowing ability.  Diet advance per speech  therapy  Alcoholic intoxication/withdrawal Reports drinking about 18 cans of beer a day.  -No further indication for CIWA monitoring -Encouraged cessation. -Continue vitamins and folic acid.  Intermittent confusion/encephalopathy likely delirium.  He is now oriented x4 but limited insight. -Reorientation and delirium precautions -Appreciate psychiatric follow-up -Minimize sedating medications.  Transient respiratory distress: Resolved.  Concern about aspiration.  CXR without acute finding.  BNP slightly elevated to 133.  Appears euvolemic. -Aspiration precautions and dysphagia 2 diet.  Essential hypertension: BP slightly elevated. -Continue home atenolol and Cardura. -Continue as needed hydralazine -Discontinue ibuprofen   Hypomagnesemia: Likely due to alcohol.  Resolved.  Depression: Seems stable.  Denies SI/HI/audiovisual hallucination. -Continue home doxepin, Paxil  Traumatic rhabdomyolysis:CK 452> 227.  Likely due to alcohol and fall.  Improved.   GERD (gastroesophageal reflux disease) -Continue Protonix  Constipation: Resolved. -Bowel regimens.  Mild thrombocytopenia: Likely due to alcohol. -Continue monitoring  Social issues: Lives alone.  Drinks heavily.  Found lying in fecal matters when EMS arrived. -Agreed to go to SNF but known for changing his mind. -Psych reconsulted for capacity as requested by APS. -Patient does not have capacity -Patient has a bed offer at [facility] in [place].  Unclear where we are in the process of getting him transferred.  Debility/physical deconditioning -PT/OT: skilled nursing facility  Severe protein-calorie malnutrition: As evidenced by significant muscle mass and subcu fat loss. -Nutrition supplement, Ensure -Appreciate input by dietitian. There is no height or weight on file to calculate BMI. Nutrition Problem: Severe Malnutrition Etiology: social / environmental circumstances (EtOH  abuse) Signs/Symptoms: severe fat depletion, moderate muscle depletion, severe muscle depletion Interventions: Refer to RD note for recommendations    DVT prophylaxis: Lovenox Code Status: Full Family Communication: None today  disposition Plan: Status is: Inpatient  Remains inpatient appropriate because:Unsafe d/c plan   Dispo: The patient is from: Home              Anticipated d/c is to: SNF              Anticipated d/c date is: 2 days              Patient currently is medically stable to d/c.   Unsafe discharge plan.  Patient does not have capacity and thus we cannot safely discharged home.  He has a bed offer from [facility] in [place].  We have not heard back from DSS or patient's daughter regarding authorization to go to these facilities.  TOC is aware.  Will follow up.  Disposition plan pending.   Consultants:   Psychiatry  Procedures:   None  Antimicrobials:  None   Subjective: Patient seen and examined.  Expresses desire not to go to facility in [place].  No pain complaints.  Objective: Vitals:   06/17/20 1539 06/17/20 2112 06/17/20 2344 06/18/20 0731  BP: (!) 160/92 (!) 141/81 (!) 142/80 129/76  Pulse: 61 60 64 64  Resp: 17 16 17 16   Temp: 97.8 F (36.6 C) 97.9 F (36.6 C) (!) 97.3 F (36.3 C) 97.9 F (36.6 C)  TempSrc:  Oral Oral Oral  SpO2: 96% 97% 97% 94%  Weight:  Intake/Output Summary (Last 24 hours) at 06/18/2020 1252 Last data filed at 06/18/2020 0946 Gross per 24 hour  Intake 720 ml  Output 800 ml  Net -80 ml   Filed Weights   06/15/20 0500  Weight: 54.9 kg    Examination:  General: Disheveled, appears chronically ill, no apparent distress HEENT: Normocephalic, atraumatic Neck, supple, trachea midline, no tenderness Heart: Regular rate and rhythm, S1/S2 normal, no murmurs Lungs: Clear to auscultation bilaterally, no adventitious sounds, normal work of breathing Abdomen: Soft, nontender, nondistended, positive  bowel sounds Extremities: Right upper extremity in sling Skin: No rashes or lesions.  Pale color Neurologic: Cranial nerves grossly intact, sensation intact, alert and oriented x3 Psychiatric: Normal affect       Data Reviewed: I have personally reviewed following labs and imaging studies  CBC: No results for input(s): WBC, NEUTROABS, HGB, HCT, MCV, PLT in the last 168 hours. Basic Metabolic Panel: Recent Labs  Lab 06/15/20 0517  CREATININE 0.56*   GFR: Estimated Creatinine Clearance: 68.6 mL/min (A) (by C-G formula based on SCr of 0.56 mg/dL (L)). Liver Function Tests: No results for input(s): AST, ALT, ALKPHOS, BILITOT, PROT, ALBUMIN in the last 168 hours. No results for input(s): LIPASE, AMYLASE in the last 168 hours. No results for input(s): AMMONIA in the last 168 hours. Coagulation Profile: No results for input(s): INR, PROTIME in the last 168 hours. Cardiac Enzymes: No results for input(s): CKTOTAL, CKMB, CKMBINDEX, TROPONINI in the last 168 hours. BNP (last 3 results) No results for input(s): PROBNP in the last 8760 hours. HbA1C: No results for input(s): HGBA1C in the last 72 hours. CBG: No results for input(s): GLUCAP in the last 168 hours. Lipid Profile: No results for input(s): CHOL, HDL, LDLCALC, TRIG, CHOLHDL, LDLDIRECT in the last 72 hours. Thyroid Function Tests: No results for input(s): TSH, T4TOTAL, FREET4, T3FREE, THYROIDAB in the last 72 hours. Anemia Panel: No results for input(s): VITAMINB12, FOLATE, FERRITIN, TIBC, IRON, RETICCTPCT in the last 72 hours. Sepsis Labs: No results for input(s): PROCALCITON, LATICACIDVEN in the last 168 hours.  No results found for this or any previous visit (from the past 240 hour(s)).       Radiology Studies: No results found.      Scheduled Meds: . acetaminophen  650 mg Oral Q8H  . ascorbic acid  1,000 mg Oral Daily  . aspirin EC  81 mg Oral Daily  . atenolol  25 mg Oral BID  . calcium-vitamin D  1  tablet Oral Q breakfast  . cholecalciferol  5,000 Units Oral Daily  . enoxaparin (LOVENOX) injection  40 mg Subcutaneous Q24H  . feeding supplement (NEPRO CARB STEADY)  237 mL Oral TID BM  . folic acid  1 mg Oral Daily  . lidocaine  1 patch Transdermal Q24H  . multivitamin with minerals  1 tablet Oral Daily  . pantoprazole  40 mg Oral Daily  . PARoxetine  30 mg Oral Daily  . thiamine  100 mg Oral Daily   Or  . thiamine  100 mg Intravenous Daily   Continuous Infusions:   LOS: 12 days    Time spent: 15 minutes    Sidney Ace, MD [facility] Pager [phone]  If 7PM-7AM, please contact night-coverage 06/18/2020, 12:52 PM
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
## 2020-06-19 NOTE — TOC Progression Note (Signed)
Transition of Care (TOC) - Progression Note    Patient Details  Name: Douglas Peterson. MRN: [id number] Date of Birth: 30-May-1951  Transition [facility] CM/SW Contact  Shelbie Ammons, RN Phone Number: 06/19/2020, 3:05 PM  Clinical Narrative:     RNCM reached out to [facility] with DSS, left VM for return call.   RNCM reached out patient's daughter [name], she reports that she would be agreeable to sign patient into [facility].   RNCM placed call to [facility], they do still have a bed available and will work on Building surveyor. Daughter reports patient told her he has had Covid Vaccine but this CM is unable to find that documentation.         Expected Discharge Plan and Services                                                 Social Determinants of Health (SDOH) Interventions    Readmission Risk Interventions Readmission Risk Prevention Plan 05/19/2020 12/13/2019 12/12/2019  Transportation Screening Complete Complete Complete  PCP or Specialist Appt within 3-5 Days - (No Data) -  [place]RI or [place] - (No Data) -  Palliative Care Screening - Complete Complete  Medication Review (RN Care Manager) Complete Complete Complete  PCP or Specialist appointment within 3-5 days of discharge Complete - -  [place] or Home Care Consult Complete - -  SW Recovery Care/Counseling Consult Complete - -  Palliative Care Screening Not Applicable - -  [place] Not Applicable - -  Some recent data might be hidden

## 2020-06-19 NOTE — Progress Notes (Signed)
Pt has been stable the whole shift, VS stable. Pt was seen walking the PT today with cane. Still awaiting for bed placement.
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
## 2020-06-19 NOTE — Progress Notes (Signed)
PROGRESS NOTE    Douglas Peterson.  FYB:[id number] DOB: 1950/11/28 DOA: 06/05/2020 PCP: [name], MD    Brief Narrative: 69 year old male with history of HTN, GERD, depression, A. fib, alcohol abuse chronic right-sided weakness presenting with right shoulder pain after fall at home, and admitted for alcohol intoxication/withdrawal, rhabdomyolysis and right distal clavicular fracture.  CT head without acute finding.  Psychiatry consulted to assess for capacity to determine guardianship by APS but was not able to assess due to mental status.  Psych consulted.  Waiting on SNF bed.  8/11: Patient seen and examined.  Lengthy conversation about continued hospitalization.  Patient expresses reasonable insight for his hospitalization and recurrent hospital visits.  Expresses motivation for rehabilitation and commits to not drinking.  8/12: Patient seen and examined.  This morning patient expresses desire to return back home.  I told him this may or may not be an option as he currently is determined not to have capacity to make his medical decisions.  Also communicated this with case management.  All areas skilled nursing facilities have declined.  Disposition plan unclear.  8/13: Patient seen and examined.  Endorses poor sleep.  Desires to leave the hospital.  Explained that cannot safely discharged home as he was determined not to have capacity.  Per case management the patient may have a bed offer from a skilled nursing facility in [place].  Disposition plan pending.  8/14: Patient seen and examined.  Continues to desire to leave the hospital.  Explained that he cannot be safely discharged home.  Apparently there is a facility in [place] that may be interested in accepting him. Case management is aware.  Working on placement.  8/15: Patient seen and examined.  Per case manager notes from 06/15/2020 he has a bed offer from [facility] in [place].  TOC social worker Judson Roch was working on  possible disposition.  No changes from clinical standpoint.  8/16: Patient seen and examined.  We are still pending disposition to skilled nursing facility.  DSS and the patient's daughter have been contacted.  Still do not have a authorization for patient to go to this facility.  On my interview today patient states that he wants to go back home.  I explained that while I would like to send him back home because of his recurrent admissions and his lack of capacity we are unable to do so.  8/17: Patient seen and examined.  Disposition plan is unclear at this time.  Case management has been unable to get a hold of patient's DSS worker or his daughter to approve his transfer over to skilled nursing facility.  Patient himself does not wish to go to the skilled nursing facility and instead wants to go home.  He does agree to going to a local facility such [facility] however [facility]  This is turned into a disposition challenge.  I am unclear if the appropriate course of action.   Assessment & Plan:   Principal Problem:   Alcohol withdrawal ([place]) Active Problems:   Diarrhea   Pressure injury of ankle, stage 1   Alcoholic intoxication with complication (HCC)   ETOH abuse   Depression   Severe protein-calorie malnutrition (HCC)   Fall   Rhabdomyolysis   Closed fracture of distal clavicle_Right   GERD (gastroesophageal reflux disease)  Fall at home-likely due to alcohol intoxication. Closed fracture of right distal clavicle:  -Supportive care with sling and pain control -Scheduled Tylenol with  as needed OxyIR.  -PT/OT: Recommend SNF.  Bed search in progress but no bed offers have been made.  Likely owing to the fact the patient has a history of leaving AMA from SNF's after 1 or 2 days -Patient desires to return back home however this is also not a safe disposition plan.  Per psychiatry patient does not have capacity to make his own medical decisions this  discharge home would be unsafe plan.  Disposition plan unclear at this time. -Per case management patient may have a accepting facility in [place].  TOC to follow-up -Patient does not want to go to facility in [place].  Unfortunately may not have a choice.  Someone will need to approve his transfer to the SNF to either his daughter or DSS. -Case management is aware of challenges.  Dysphagia Some concern about aspiration on 8/5. -Aspiration precaution. -Patient made progress and his dysphagia and swallowing ability.  Diet advance per speech therapy  Alcoholic intoxication/withdrawal Reports drinking about 18 cans of beer a day.  -No further indication for CIWA monitoring -Encouraged cessation. -Continue vitamins and folic acid.  Intermittent confusion/encephalopathy likely delirium.  He is now oriented x4 but limited insight. -Reorientation and delirium precautions -Appreciate psychiatric follow-up -Minimize sedating medications.  Transient respiratory distress: Resolved.  Concern about aspiration.  CXR without acute finding.  BNP slightly elevated to 133.  Appears euvolemic. -Aspiration precautions and dysphagia 2 diet.  Essential hypertension: BP slightly elevated. -Continue home atenolol and Cardura. -Continue as needed hydralazine -Discontinue ibuprofen   Hypomagnesemia: Likely due to alcohol.  Resolved.  Depression: Seems stable.  Denies SI/HI/audiovisual hallucination. -Continue home doxepin, Paxil  Traumatic rhabdomyolysis:CK 452> 227.  Likely due to alcohol and fall.  Improved.   GERD (gastroesophageal reflux disease) -Continue Protonix  Constipation: Resolved. -Bowel regimens.  Mild thrombocytopenia: Likely due to alcohol. -Continue monitoring  Social issues: Lives alone.  Drinks heavily.  Found lying in fecal matters when EMS arrived. -Agreed to go to SNF but known for changing his mind. -Psych reconsulted for capacity as requested by  APS. -Patient does not have capacity -Patient has a bed offer at [facility] in [place].  Unclear where we are in the process of getting him transferred.  Debility/physical deconditioning -PT/OT: skilled nursing facility  Severe protein-calorie malnutrition: As evidenced by significant muscle mass and subcu fat loss. -Nutrition supplement, Ensure -Appreciate input by dietitian. There is no height or weight on file to calculate BMI. Nutrition Problem: Severe Malnutrition Etiology: social / environmental circumstances (EtOH abuse) Signs/Symptoms: severe fat depletion, moderate muscle depletion, severe muscle depletion Interventions: Refer to RD note for recommendations    DVT prophylaxis: Lovenox Code Status: Full Family Communication: None today  disposition Plan: Status is: Inpatient  Remains inpatient appropriate because:Unsafe d/c plan   Dispo: The patient is from: Home              Anticipated d/c is to: SNF              Anticipated d/c date is: 2 days              Patient currently is medically stable to d/c.   Unsafe discharge plan.  Patient does not have capacity and thus we cannot safely discharged home.  He has a bed offer from [facility] in [place].  We have not heard back from DSS or patient's daughter regarding authorization to go to these facilities.  TOC is aware.  Will follow up.  Disposition plan pending.  Consultants:   Psychiatry  Procedures:   None  Antimicrobials:  None   Subjective: Patient seen and examined.  No pain complaints this morning.  Improving pain in the right shoulder.  Again expresses desire to go home.  Objective: Vitals:   06/18/20 0731 06/18/20 1617 06/18/20 2319 06/19/20 0727  BP: 129/76 139/81 138/86 111/75  Pulse: 64 70 61 (!) 55  Resp: 16 17 17 15   Temp: 97.9 F (36.6 C) 97.8 F (36.6 C) 97.9 F (36.6 C) 97.7 F (36.5 C)  TempSrc: Oral Oral Oral Oral  SpO2: 94% 96% 96% 96%  Weight:         Intake/Output Summary (Last 24 hours) at 06/19/2020 1410 Last data filed at 06/19/2020 1300 Gross per 24 hour  Intake 1020 ml  Output 600 ml  Net 420 ml   Filed Weights   06/15/20 0500  Weight: 54.9 kg    Examination:  General: Disheveled, appears chronically ill, no apparent distress HEENT: Normocephalic, atraumatic Neck, supple, trachea midline, no tenderness Heart: Regular rate and rhythm, S1/S2 normal, no murmurs Lungs: Clear to auscultation bilaterally, no adventitious sounds, normal work of breathing Abdomen: Soft, nontender, nondistended, positive bowel sounds Extremities: Right upper extremity in sling Skin: No rashes or lesions.  Pale color Neurologic: Cranial nerves grossly intact, sensation intact, alert and oriented x3 Psychiatric: Normal affect       Data Reviewed: I have personally reviewed following labs and imaging studies  CBC: No results for input(s): WBC, NEUTROABS, HGB, HCT, MCV, PLT in the last 168 hours. Basic Metabolic Panel: Recent Labs  Lab 06/15/20 0517  CREATININE 0.56*   GFR: Estimated Creatinine Clearance: 68.6 mL/min (A) (by C-G formula based on SCr of 0.56 mg/dL (L)). Liver Function Tests: No results for input(s): AST, ALT, ALKPHOS, BILITOT, PROT, ALBUMIN in the last 168 hours. No results for input(s): LIPASE, AMYLASE in the last 168 hours. No results for input(s): AMMONIA in the last 168 hours. Coagulation Profile: No results for input(s): INR, PROTIME in the last 168 hours. Cardiac Enzymes: No results for input(s): CKTOTAL, CKMB, CKMBINDEX, TROPONINI in the last 168 hours. BNP (last 3 results) No results for input(s): PROBNP in the last 8760 hours. HbA1C: No results for input(s): HGBA1C in the last 72 hours. CBG: No results for input(s): GLUCAP in the last 168 hours. Lipid Profile: No results for input(s): CHOL, HDL, LDLCALC, TRIG, CHOLHDL, LDLDIRECT in the last 72 hours. Thyroid Function Tests: No results for input(s):  TSH, T4TOTAL, FREET4, T3FREE, THYROIDAB in the last 72 hours. Anemia Panel: No results for input(s): VITAMINB12, FOLATE, FERRITIN, TIBC, IRON, RETICCTPCT in the last 72 hours. Sepsis Labs: No results for input(s): PROCALCITON, LATICACIDVEN in the last 168 hours.  No results found for this or any previous visit (from the past 240 hour(s)).       Radiology Studies: No results found.      Scheduled Meds: . acetaminophen  650 mg Oral Q8H  . ascorbic acid  1,000 mg Oral Daily  . aspirin EC  81 mg Oral Daily  . atenolol  25 mg Oral BID  . calcium-vitamin D  1 tablet Oral Q breakfast  . cholecalciferol  5,000 Units Oral Daily  . enoxaparin (LOVENOX) injection  40 mg Subcutaneous Q24H  . feeding supplement (NEPRO CARB STEADY)  237 mL Oral TID BM  . folic acid  1 mg Oral Daily  . lidocaine  1 patch Transdermal Q24H  . multivitamin with minerals  1 tablet Oral Daily  .  pantoprazole  40 mg Oral Daily  . PARoxetine  30 mg Oral Daily  . thiamine  100 mg Oral Daily   Or  . thiamine  100 mg Intravenous Daily   Continuous Infusions:   LOS: 13 days    Time spent: 15 minutes    Sidney Ace, MD [facility] Pager [phone]  If 7PM-7AM, please contact night-coverage 06/19/2020, 2:10 PM

## 2020-06-19 NOTE — Plan of Care (Signed)
  Problem: Education: Goal: Knowledge of General Education information will improve Description Including pain rating scale, medication(s)/side effects and non-pharmacologic comfort measures Outcome: Progressing   

## 2020-06-20 ENCOUNTER — Encounter: Payer: Self-pay | Admitting: Student

## 2020-06-20 NOTE — Progress Notes (Signed)
Pt remains stable, VS stable. Educate pt not get out of the bed or chair alone as he is risk for fall but not complaint to he protocol. Low bed provided. Pt is alert and oriented. Pt mentioned he will not go to any SNF but to his home only.
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
## 2020-06-20 NOTE — Progress Notes (Addendum)
Progress Note    Douglas Peterson.  [id number] DOB: 01/04/1951  DOA: 06/05/2020 PCP: [name], MD      Brief Narrative:    Medical records reviewed and are as summarized below:  Douglas Peterson. is a 69 y.o. male       Assessment/Plan:   Principal Problem:   Alcohol withdrawal ([place]) Active Problems:   Diarrhea   Pressure injury of ankle, stage 1   Alcoholic intoxication with complication ([place])   ETOH abuse   Depression   Severe protein-calorie malnutrition ([place])   Fall   Rhabdomyolysis   Closed fracture of distal clavicle_Right   GERD (gastroesophageal reflux disease)   Nutrition Problem: Severe Malnutrition Etiology: social / environmental circumstances (EtOH abuse)  Signs/Symptoms: severe fat depletion, moderate muscle depletion, severe muscle depletion    Alcohol use disorder with recent alcohol intoxication S/p fall at home Dysphagia Hypertension Depression Mild traumatic rhabdomyolysis-resolved Severe protein calorie malnutrition Minimally displaced distal right clavicle fracture  PLAN  Continue antihypertensives Continue vitamin supplements and nutritional supplements Awaiting placement to SNF Case was discussed with Dr. Aldine Contes, psychiatrist, today who said patient does not have capacity to make decision.   Body mass index is 20.77 kg/m.  Diet Order            Diet regular Room service appropriate? Yes; Fluid consistency: Thin  Diet effective now                     Medications:   . acetaminophen  650 mg Oral Q8H  . ascorbic acid  1,000 mg Oral Daily  . aspirin EC  81 mg Oral Daily  . atenolol  25 mg Oral BID  . calcium-vitamin D  1 tablet Oral Q breakfast  . cholecalciferol  5,000 Units Oral Daily  . enoxaparin (LOVENOX) injection  40 mg Subcutaneous Q24H  . feeding supplement (NEPRO CARB STEADY)  237 mL Oral TID BM  . folic acid  1 mg Oral Daily  . lidocaine  1 patch Transdermal Q24H  . multivitamin  with minerals  1 tablet Oral Daily  . pantoprazole  40 mg Oral Daily  . PARoxetine  30 mg Oral Daily  . thiamine  100 mg Oral Daily   Or  . thiamine  100 mg Intravenous Daily   Continuous Infusions:   Anti-infectives (From admission, onward)   None             Family Communication/Anticipated D/C date and plan/Code Status   DVT prophylaxis: enoxaparin (LOVENOX) injection 40 mg Start: 06/05/20 2200     Code Status: Full Code  Family Communication:  Disposition Plan:    Status is: Inpatient  Remains inpatient appropriate because:Unsafe d/c plan   Dispo: The patient is from: Home              Anticipated d/c is to: SNF              Anticipated d/c date is: 1 day              Patient currently is not medically stable to d/c.           Subjective:   No complaints.  He is frustrated about staying here.  He wants to go home.  He thinks he has the capacity to make decisions by himself.  Objective:    Vitals:   06/19/20 1535 06/19/20 2339 06/20/20 0740 06/20/20 1549  BP: 125/78 (!) 156/82 127/71 (!) 152/84  Pulse: 64 63 (!) 54 66  Resp: 15 16 16 17   Temp: 98.2 F (36.8 C) 98.6 F (37 C)  98.2 F (36.8 C)  TempSrc: Oral Oral  Oral  SpO2: 94% 95% 96% 95%  Weight:       No data found.   Intake/Output Summary (Last 24 hours) at 06/20/2020 1817 Last data filed at 06/20/2020 1500 Gross per 24 hour  Intake 480 ml  Output 900 ml  Net -420 ml   Filed Weights   06/15/20 0500  Weight: 54.9 kg    Exam:  GEN: NAD SKIN: No rash EYES: EOMI ENT: MMM CV: RRR PULM: CTA B ABD: soft, ND, NT, +BS CNS: AAO x 3, right upper extremity weakness.  right arm in the sling. EXT: No edema or tenderness   Data Reviewed:   I have personally reviewed following labs and imaging studies:  Labs: Labs show the following:   Basic Metabolic Panel: Recent Labs  Lab 06/15/20 0517  CREATININE 0.56*   GFR Estimated Creatinine Clearance: 67.7 mL/min (A) (by  C-G formula based on SCr of 0.56 mg/dL (L)). Liver Function Tests: No results for input(s): AST, ALT, ALKPHOS, BILITOT, PROT, ALBUMIN in the last 168 hours. No results for input(s): LIPASE, AMYLASE in the last 168 hours. No results for input(s): AMMONIA in the last 168 hours. Coagulation profile No results for input(s): INR, PROTIME in the last 168 hours.  CBC: No results for input(s): WBC, NEUTROABS, HGB, HCT, MCV, PLT in the last 168 hours. Cardiac Enzymes: No results for input(s): CKTOTAL, CKMB, CKMBINDEX, TROPONINI in the last 168 hours. BNP (last 3 results) No results for input(s): PROBNP in the last 8760 hours. CBG: No results for input(s): GLUCAP in the last 168 hours. D-Dimer: No results for input(s): DDIMER in the last 72 hours. Hgb A1c: No results for input(s): HGBA1C in the last 72 hours. Lipid Profile: No results for input(s): CHOL, HDL, LDLCALC, TRIG, CHOLHDL, LDLDIRECT in the last 72 hours. Thyroid function studies: No results for input(s): TSH, T4TOTAL, T3FREE, THYROIDAB in the last 72 hours.  Invalid input(s): FREET3 Anemia work up: No results for input(s): VITAMINB12, FOLATE, FERRITIN, TIBC, IRON, RETICCTPCT in the last 72 hours. Sepsis Labs: No results for input(s): PROCALCITON, WBC, LATICACIDVEN in the last 168 hours.  Microbiology No results found for this or any previous visit (from the past 240 hour(s)).  Procedures and diagnostic studies:  No results found.             LOS: 14 days   [place] Copywriter, advertising on [email or web address]. If 7PM-7AM, please contact night-coverage at [email or web address]     06/20/2020, 6:17 PM

## 2020-06-20 NOTE — Care Management Important Message (Signed)
Important Message  Patient Details  Name: Douglas Peterson. MRN: [id number] Date of Birth: 1951/10/02   Medicare Important Message Given:  Yes     Loann Quill 06/20/2020, 11:42 AM

## 2020-06-21 DIAGNOSIS — S42031D Displaced fracture of lateral end of right clavicle, subsequent encounter for fracture with routine healing: Secondary | ICD-10-CM

## 2020-06-21 MED ORDER — ENSURE ENLIVE PO LIQD
237.0000 mL | Freq: Two times a day (BID) | ORAL | Status: DC
  Administered 2020-06-21 – 2020-06-26 (×10): 237 mL via ORAL

## 2020-06-21 NOTE — Progress Notes (Addendum)
Progress Note    Douglas Peterson.  [id number]:[id number] DOB: 1951-09-14  DOA: 06/05/2020 PCP: [name], MD      Brief Narrative:    Medical records reviewed and are as summarized below:  Douglas Peterson. is a 69 y.o. male       Assessment/Plan:   Principal Problem:   Alcohol withdrawal ([place]) Active Problems:   Diarrhea   Pressure injury of ankle, stage 1   Alcoholic intoxication with complication ([place])   ETOH abuse   Depression   Severe protein-calorie malnutrition ([place])   Fall   Rhabdomyolysis   Closed fracture of distal clavicle_Right   GERD (gastroesophageal reflux disease)   Nutrition Problem: Severe Malnutrition Etiology: social / environmental circumstances (EtOH abuse)  Signs/Symptoms: severe fat depletion, moderate muscle depletion, severe muscle depletion    Alcohol use disorder with recent alcohol intoxication S/p fall at home Dysphagia Hypertension Depression Mild traumatic rhabdomyolysis-resolved Severe protein calorie malnutrition Minimally displaced distal right clavicle fracture  PLAN  Continue antihypertensives Continue vitamin supplements and nutritional supplements Follow-up with orthopedic surgeon as outpatient was recommended for right clavicular fracture. Awaiting placement to SNF Dr. Aldine Contes, psychiatrist, was informed about patient's request to see him today via secure chat. I also spoke with [place] worker from DSS this morning when she said they are planning to take over guardianship. Patient was very agitated and said that he did not want his sister to be involved in his care and also he did not want his sister to make decisions for him.   Body mass index is 20.77 kg/m.  Diet Order            Diet regular Room service appropriate? Yes; Fluid consistency: Thin  Diet effective now                     Medications:   . acetaminophen  650 mg Oral Q8H  . ascorbic acid  1,000 mg Oral Daily  .  aspirin EC  81 mg Oral Daily  . atenolol  25 mg Oral BID  . calcium-vitamin D  1 tablet Oral Q breakfast  . cholecalciferol  5,000 Units Oral Daily  . enoxaparin (LOVENOX) injection  40 mg Subcutaneous Q24H  . feeding supplement (ENSURE ENLIVE)  237 mL Oral BID BM  . folic acid  1 mg Oral Daily  . lidocaine  1 patch Transdermal Q24H  . multivitamin with minerals  1 tablet Oral Daily  . pantoprazole  40 mg Oral Daily  . PARoxetine  30 mg Oral Daily  . thiamine  100 mg Oral Daily   Or  . thiamine  100 mg Intravenous Daily   Continuous Infusions:   Anti-infectives (From admission, onward)   None             Family Communication/Anticipated D/C date and plan/Code Status   DVT prophylaxis: enoxaparin (LOVENOX) injection 40 mg Start: 06/05/20 2200     Code Status: Full Code  Family Communication:  Disposition Plan:    Status is: Inpatient  Remains inpatient appropriate because:Unsafe d/c plan   Dispo: The patient is from: Home              Anticipated d/c is to: SNF              Anticipated d/c date is: 1 day              Patient currently is not medically stable to d/c.  Subjective:   Overnight events noted.  Patient was upset about having to go to SNF at discharge.  He insisted that he is able to make decision for himself and he wants to go home today.  He demanded to see the psychiatrist who said he did not have capacity to make decision for himself.  Anderson Malta, RN and [place], [place] were at the bedside during this encounter  Objective:    Vitals:   06/20/20 2353 06/21/20 0729 06/21/20 1544 06/21/20 1615  BP: (!) 146/81 136/69 (!) 142/82   Pulse: 70 62 62   Resp: 17 15 16    Temp: 97.7 F (36.5 C) 97.6 F (36.4 C) 97.8 F (36.6 C)   TempSrc: Oral Oral Oral   SpO2: 94% 97% 98%   Weight:      Height:    5' 4"  (1.626 m)   No data found.   Intake/Output Summary (Last 24 hours) at 06/21/2020 1628 Last data filed at 06/21/2020  1300 Gross per 24 hour  Intake 960 ml  Output 1400 ml  Net -440 ml   Filed Weights   06/15/20 0500  Weight: 54.9 kg    Exam:  GEN: NAD SKIN: No rash EYES: No pallor or icterus ENT: MMM CV: RRR PULM: CTA B ABD: soft, ND, NT, +BS CNS: AAO x 3, right upper extremity weakness.  right arm in a sling. EXT: No edema or tenderness PSYCH: Angry and agitated   Data Reviewed:   I have personally reviewed following labs and imaging studies:  Labs: Labs show the following:   Basic Metabolic Panel: Recent Labs  Lab 06/15/20 0517  CREATININE 0.56*   GFR Estimated Creatinine Clearance: 67.7 mL/min (A) (by C-G formula based on SCr of 0.56 mg/dL (L)). Liver Function Tests: No results for input(s): AST, ALT, ALKPHOS, BILITOT, PROT, ALBUMIN in the last 168 hours. No results for input(s): LIPASE, AMYLASE in the last 168 hours. No results for input(s): AMMONIA in the last 168 hours. Coagulation profile No results for input(s): INR, PROTIME in the last 168 hours.  CBC: No results for input(s): WBC, NEUTROABS, HGB, HCT, MCV, PLT in the last 168 hours. Cardiac Enzymes: No results for input(s): CKTOTAL, CKMB, CKMBINDEX, TROPONINI in the last 168 hours. BNP (last 3 results) No results for input(s): PROBNP in the last 8760 hours. CBG: No results for input(s): GLUCAP in the last 168 hours. D-Dimer: No results for input(s): DDIMER in the last 72 hours. Hgb A1c: No results for input(s): HGBA1C in the last 72 hours. Lipid Profile: No results for input(s): CHOL, HDL, LDLCALC, TRIG, CHOLHDL, LDLDIRECT in the last 72 hours. Thyroid function studies: No results for input(s): TSH, T4TOTAL, T3FREE, THYROIDAB in the last 72 hours.  Invalid input(s): FREET3 Anemia work up: No results for input(s): VITAMINB12, FOLATE, FERRITIN, TIBC, IRON, RETICCTPCT in the last 72 hours. Sepsis Labs: No results for input(s): PROCALCITON, WBC, LATICACIDVEN in the last 168 hours.  Microbiology No results  found for this or any previous visit (from the past 240 hour(s)).  Procedures and diagnostic studies:  No results found.             LOS: 15 days   [place] Copywriter, advertising on [email or web address]. If 7PM-7AM, please contact night-coverage at [email or web address]     06/21/2020, 4:28 PM
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
## 2020-06-22 NOTE — Progress Notes (Signed)
Progress Note    Douglas Peterson.  NTI:[id number] DOB: 03-28-1951  DOA: 06/05/2020 PCP: [name], MD      Brief Narrative:    Medical records reviewed and are as summarized below:  Douglas Peterson. is a 69 y.o. male       Assessment/Plan:   Principal Problem:   Alcohol withdrawal ([place]) Active Problems:   Diarrhea   Pressure injury of ankle, stage 1   Alcoholic intoxication with complication ([place])   ETOH abuse   Depression   Severe protein-calorie malnutrition ([place])   Fall   Rhabdomyolysis   Closed fracture of distal clavicle_Right   GERD (gastroesophageal reflux disease)   Nutrition Problem: Severe Malnutrition Etiology: social / environmental circumstances (EtOH abuse)  Signs/Symptoms: severe fat depletion, moderate muscle depletion, severe muscle depletion    Alcohol use disorder with recent alcohol intoxication S/p fall at home Dysphagia Hypertension Sinus bradycardia, asymptomatic Depression Mild traumatic rhabdomyolysis-resolved Severe protein calorie malnutrition Minimally displaced distal right clavicle fracture  PLAN  Continue antihypertensives Continue vitamin supplements and nutritional supplements Follow-up with orthopedic surgeon as outpatient was recommended for right clavicular fracture. Awaiting placement to SNF Dr. Aldine Contes, psychiatrist, has reassessed the patient and patient is still deemed incapable of making decisions for himself. Check CBC and BMP tomorrow   Body mass index is 25.92 kg/m.  Diet Order            Diet regular Room service appropriate? Yes; Fluid consistency: Thin  Diet effective now                     Medications:   . acetaminophen  650 mg Oral Q8H  . ascorbic acid  1,000 mg Oral Daily  . aspirin EC  81 mg Oral Daily  . atenolol  25 mg Oral BID  . calcium-vitamin D  1 tablet Oral Q breakfast  . cholecalciferol  5,000 Units Oral Daily  . enoxaparin (LOVENOX) injection  40 mg  Subcutaneous Q24H  . feeding supplement (ENSURE ENLIVE)  237 mL Oral BID BM  . folic acid  1 mg Oral Daily  . lidocaine  1 patch Transdermal Q24H  . multivitamin with minerals  1 tablet Oral Daily  . pantoprazole  40 mg Oral Daily  . PARoxetine  30 mg Oral Daily  . thiamine  100 mg Oral Daily   Or  . thiamine  100 mg Intravenous Daily   Continuous Infusions:   Anti-infectives (From admission, onward)   None             Family Communication/Anticipated D/C date and plan/Code Status   DVT prophylaxis: enoxaparin (LOVENOX) injection 40 mg Start: 06/05/20 2200     Code Status: Full Code  Family Communication:  Disposition Plan:    Status is: Inpatient  Remains inpatient appropriate because:Unsafe d/c plan   Dispo: The patient is from: Home              Anticipated d/c is to: SNF              Anticipated d/c date is: 1 day              Patient currently is not medically stable to d/c.           Subjective:   No complaints today.  Interval events noted.  Objective:    Vitals:   06/22/20 0500 06/22/20 0551 06/22/20 0737 06/22/20 1510  BP:  132/83 139/77 (!) 142/93  Pulse:  [name])  56 (!) 54 67  Resp:   16 16  Temp:   (!) 97.5 F (36.4 C) 97.7 F (36.5 C)  TempSrc:   Oral Oral  SpO2:  96% 96% 96%  Weight: 68.5 kg     Height:       No data found.   Intake/Output Summary (Last 24 hours) at 06/22/2020 1818 Last data filed at 06/22/2020 1354 Gross per 24 hour  Intake 720 ml  Output 1000 ml  Net -280 ml   Filed Weights   06/15/20 0500 06/22/20 0500  Weight: 54.9 kg 68.5 kg    Exam:  GEN: No acute distress, he is more pleasant today. SKIN: No rash EYES: No pallor or icterus ENT: MMM CV: RRR PULM: No wheezing or rales. ABD: soft, ND, NT, +BS CNS: AAO x 3, right upper extremity weakness.  right arm in a sling. EXT: No edema or tenderness PSYCH: Calm and cooperative   Data Reviewed:   I have personally reviewed following labs and  imaging studies:  Labs: Labs show the following:   Basic Metabolic Panel: No results for input(s): NA, K, CL, CO2, GLUCOSE, BUN, CREATININE, CALCIUM, MG, PHOS in the last 168 hours. GFR Estimated Creatinine Clearance: 73 mL/min (A) (by C-G formula based on SCr of 0.56 mg/dL (L)). Liver Function Tests: No results for input(s): AST, ALT, ALKPHOS, BILITOT, PROT, ALBUMIN in the last 168 hours. No results for input(s): LIPASE, AMYLASE in the last 168 hours. No results for input(s): AMMONIA in the last 168 hours. Coagulation profile No results for input(s): INR, PROTIME in the last 168 hours.  CBC: No results for input(s): WBC, NEUTROABS, HGB, HCT, MCV, PLT in the last 168 hours. Cardiac Enzymes: No results for input(s): CKTOTAL, CKMB, CKMBINDEX, TROPONINI in the last 168 hours. BNP (last 3 results) No results for input(s): PROBNP in the last 8760 hours. CBG: No results for input(s): GLUCAP in the last 168 hours. D-Dimer: No results for input(s): DDIMER in the last 72 hours. Hgb A1c: No results for input(s): HGBA1C in the last 72 hours. Lipid Profile: No results for input(s): CHOL, HDL, LDLCALC, TRIG, CHOLHDL, LDLDIRECT in the last 72 hours. Thyroid function studies: No results for input(s): TSH, T4TOTAL, T3FREE, THYROIDAB in the last 72 hours.  Invalid input(s): FREET3 Anemia work up: No results for input(s): VITAMINB12, FOLATE, FERRITIN, TIBC, IRON, RETICCTPCT in the last 72 hours. Sepsis Labs: No results for input(s): PROCALCITON, WBC, LATICACIDVEN in the last 168 hours.  Microbiology No results found for this or any previous visit (from the past 240 hour(s)).  Procedures and diagnostic studies:  No results found.             LOS: 16 days   [place] Copywriter, advertising on [email or web address]. If 7PM-7AM, please contact night-coverage at [email or web address]     06/22/2020, 6:18 PM

## 2020-06-22 NOTE — Care Management Important Message (Signed)
Important Message  Patient Details  Name: Douglas Peterson. MRN: [id number] Date of Birth: [birth year]   Medicare Important Message Given:  Yes     [name] 06/22/2020, 11:30 AM
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
## 2020-06-22 NOTE — Plan of Care (Signed)

## 2020-06-22 NOTE — Progress Notes (Signed)
Pt sitting in chair, denies any needs at this moment. Call bell within reach

## 2020-06-23 LAB — CBC WITH DIFFERENTIAL/PLATELET
Abs Immature Granulocytes: 0.02 10*3/uL (ref 0.00–0.07)
Basophils Absolute: 0.1 10*3/uL (ref 0.0–0.1)
Basophils Relative: 1 %
Eosinophils Absolute: 0.2 10*3/uL (ref 0.0–0.5)
Eosinophils Relative: 3 %
HCT: 45 % (ref 39.0–52.0)
Hemoglobin: 14.6 g/dL (ref 13.0–17.0)
Immature Granulocytes: 0 %
Lymphocytes Relative: 64 %
Lymphs Abs: 4.9 10*3/uL — ABNORMAL HIGH (ref 0.7–4.0)
MCH: 30.5 pg (ref 26.0–34.0)
MCHC: 32.4 g/dL (ref 30.0–36.0)
MCV: 94.1 fL (ref 80.0–100.0)
Monocytes Absolute: 0.6 10*3/uL (ref 0.1–1.0)
Monocytes Relative: 8 %
Neutro Abs: 1.9 10*3/uL (ref 1.7–7.7)
Neutrophils Relative %: 24 %
Platelets: 273 10*3/uL (ref 150–400)
RBC: 4.78 MIL/uL (ref 4.22–5.81)
RDW: 13.9 % (ref 11.5–15.5)
Smear Review: NORMAL
WBC: 7.7 10*3/uL (ref 4.0–10.5)
nRBC: 0 % (ref 0.0–0.2)

## 2020-06-23 LAB — PHOSPHORUS: Phosphorus: 3.8 mg/dL (ref 2.5–4.6)

## 2020-06-23 LAB — BASIC METABOLIC PANEL
Anion gap: 11 (ref 5–15)
BUN: 24 mg/dL — ABNORMAL HIGH (ref 8–23)
CO2: 27 mmol/L (ref 22–32)
Calcium: 9.4 mg/dL (ref 8.9–10.3)
Chloride: 101 mmol/L (ref 98–111)
Creatinine, Ser: 0.55 mg/dL — ABNORMAL LOW (ref 0.61–1.24)
GFR calc Af Amer: >60 mL/min (ref >60)
GFR calc non Af Amer: >60 mL/min (ref >60)
Glucose, Bld: 80 mg/dL (ref 70–99)
Potassium: 3.9 mmol/L (ref 3.5–5.1)
Sodium: 139 mmol/L (ref 135–145)

## 2020-06-23 LAB — MAGNESIUM: Magnesium: 2.3 mg/dL (ref 1.7–2.4)

## 2020-06-23 NOTE — Progress Notes (Signed)
Progress Note    Douglas Peterson.  XTK:[id number] DOB: Apr 20, 1951  DOA: 06/05/2020 PCP: [name], MD      Brief Narrative:    Medical records reviewed and are as summarized below:  Douglas Peterson. is a 69 y.o. male       Assessment/Plan:   Principal Problem:   Alcohol withdrawal ([place]) Active Problems:   Diarrhea   Pressure injury of ankle, stage 1   Alcoholic intoxication with complication ([place])   ETOH abuse   Depression   Severe protein-calorie malnutrition ([place])   Fall   Rhabdomyolysis   Closed fracture of distal clavicle_Right   GERD (gastroesophageal reflux disease)   Nutrition Problem: Severe Malnutrition Etiology: social / environmental circumstances (EtOH abuse)  Signs/Symptoms: severe fat depletion, moderate muscle depletion, severe muscle depletion    Alcohol use disorder with recent alcohol intoxication S/p fall at home Dysphagia Hypertension Sinus bradycardia, asymptomatic Depression Mild traumatic rhabdomyolysis-resolved Severe protein calorie malnutrition Minimally displaced distal right clavicle fracture  PLAN  Continue antihypertensives Continue current treatment Follow-up with orthopedic surgeon as outpatient was recommended for right clavicular fracture. CBC BMP unremarkable Awaiting placement to SNF    Body mass index is 25.92 kg/m.  Diet Order            Diet regular Room service appropriate? Yes; Fluid consistency: Thin  Diet effective now                     Medications:   . acetaminophen  650 mg Oral Q8H  . ascorbic acid  1,000 mg Oral Daily  . aspirin EC  81 mg Oral Daily  . atenolol  25 mg Oral BID  . calcium-vitamin D  1 tablet Oral Q breakfast  . cholecalciferol  5,000 Units Oral Daily  . enoxaparin (LOVENOX) injection  40 mg Subcutaneous Q24H  . feeding supplement (ENSURE ENLIVE)  237 mL Oral BID BM  . folic acid  1 mg Oral Daily  . lidocaine  1 patch Transdermal Q24H  . multivitamin with  minerals  1 tablet Oral Daily  . pantoprazole  40 mg Oral Daily  . PARoxetine  30 mg Oral Daily  . thiamine  100 mg Oral Daily   Or  . thiamine  100 mg Intravenous Daily   Continuous Infusions:   Anti-infectives (From admission, onward)   None             Family Communication/Anticipated D/C date and plan/Code Status   DVT prophylaxis: enoxaparin (LOVENOX) injection 40 mg Start: 06/05/20 2200     Code Status: Full Code  Family Communication:  Disposition Plan:    Status is: Inpatient  Remains inpatient appropriate because:Unsafe d/c plan   Dispo: The patient is from: Home              Anticipated d/c is to: SNF              Anticipated d/c date is: 1 day              Patient currently is not medically stable to d/c.           Subjective:   He has no complaints.  Pain in right shoulder is better.  Objective:    Vitals:   06/22/20 0737 06/22/20 1510 06/23/20 0123 06/23/20 0817  BP: 139/77 (!) 142/93 133/69 (!) 143/86  Pulse: (!) 54 67 (!) 55 (!) 50  Resp: 16 16 16 18   Temp: (!) 97.5  F (36.4 C) 97.7 F (36.5 C) (!) 97.5 F (36.4 C) 97.7 F (36.5 C)  TempSrc: Oral Oral Oral Oral  SpO2: 96% 96% 97% 98%  Weight:      Height:       No data found.   Intake/Output Summary (Last 24 hours) at 06/23/2020 1416 Last data filed at 06/23/2020 1222 Gross per 24 hour  Intake --  Output 1100 ml  Net -1100 ml   Filed Weights   06/15/20 0500 06/22/20 0500  Weight: 54.9 kg 68.5 kg    Exam:  GEN: NAD SKIN: Warm and dry EYES: Anicteric ENT: MMM CV: RRR PULM: Clear to auscultation bilaterally ABD: soft, ND, NT, +BS CNS: AAO x 3, right upper extremity weakness.  right arm in a sling. EXT: No tenderness or swelling in the extremities PSYCH: Calm and cooperative   Data Reviewed:   I have personally reviewed following labs and imaging studies:  Labs: Labs show the following:   Basic Metabolic Panel: Recent Labs  Lab 06/23/20 0548  NA  139  K 3.9  CL 101  CO2 27  GLUCOSE 80  BUN 24*  CREATININE 0.55*  CALCIUM 9.4  MG 2.3  PHOS 3.8   GFR Estimated Creatinine Clearance: 73 mL/min (A) (by C-G formula based on SCr of 0.55 mg/dL (L)). Liver Function Tests: No results for input(s): AST, ALT, ALKPHOS, BILITOT, PROT, ALBUMIN in the last 168 hours. No results for input(s): LIPASE, AMYLASE in the last 168 hours. No results for input(s): AMMONIA in the last 168 hours. Coagulation profile No results for input(s): INR, PROTIME in the last 168 hours.  CBC: Recent Labs  Lab 06/23/20 0548  WBC 7.7  NEUTROABS 1.9  HGB 14.6  HCT 45.0  MCV 94.1  PLT 273   Cardiac Enzymes: No results for input(s): CKTOTAL, CKMB, CKMBINDEX, TROPONINI in the last 168 hours. BNP (last 3 results) No results for input(s): PROBNP in the last 8760 hours. CBG: No results for input(s): GLUCAP in the last 168 hours. D-Dimer: No results for input(s): DDIMER in the last 72 hours. Hgb A1c: No results for input(s): HGBA1C in the last 72 hours. Lipid Profile: No results for input(s): CHOL, HDL, LDLCALC, TRIG, CHOLHDL, LDLDIRECT in the last 72 hours. Thyroid function studies: No results for input(s): TSH, T4TOTAL, T3FREE, THYROIDAB in the last 72 hours.  Invalid input(s): FREET3 Anemia work up: No results for input(s): VITAMINB12, FOLATE, FERRITIN, TIBC, IRON, RETICCTPCT in the last 72 hours. Sepsis Labs: Recent Labs  Lab 06/23/20 0548  WBC 7.7    Microbiology No results found for this or any previous visit (from the past 240 hour(s)).  Procedures and diagnostic studies:  No results found.             LOS: 17 days   [place] Copywriter, advertising on [email or web address]. If 7PM-7AM, please contact night-coverage at [email or web address]     06/23/2020, 2:16 PM

## 2020-06-23 NOTE — Plan of Care (Signed)

## 2020-06-24 NOTE — Progress Notes (Addendum)
Progress Note    Douglas Peterson.  EYC:[id number] DOB: 1951/02/26  DOA: 06/05/2020 PCP: [name], MD      Brief Narrative:    Medical records reviewed and are as summarized below:  Douglas Peterson. is a 69 y.o. male       Assessment/Plan:   Principal Problem:   Alcohol withdrawal ([place]) Active Problems:   Diarrhea   Pressure injury of ankle, stage 1   Alcoholic intoxication with complication ([place])   ETOH abuse   Depression   Severe protein-calorie malnutrition ([place])   Fall   Rhabdomyolysis   Closed fracture of distal clavicle_Right   GERD (gastroesophageal reflux disease)   Nutrition Problem: Severe Malnutrition Etiology: social / environmental circumstances (EtOH abuse)  Signs/Symptoms: severe fat depletion, moderate muscle depletion, severe muscle depletion    Alcohol use disorder with recent alcohol intoxication S/p fall at home Dysphagia Hypertension Sinus bradycardia, asymptomatic Depression Mild traumatic rhabdomyolysis-resolved Severe protein calorie malnutrition Minimally displaced distal right clavicle fracture  PLAN  Continue current medications Follow-up with orthopedic surgeon as outpatient was recommended for right clavicular fracture. Awaiting placement to SNF    Body mass index is 25.92 kg/m.  Diet Order            Diet regular Room service appropriate? Yes; Fluid consistency: Thin  Diet effective now                     Medications:   . acetaminophen  650 mg Oral Q8H  . ascorbic acid  1,000 mg Oral Daily  . aspirin EC  81 mg Oral Daily  . atenolol  25 mg Oral BID  . calcium-vitamin D  1 tablet Oral Q breakfast  . cholecalciferol  5,000 Units Oral Daily  . enoxaparin (LOVENOX) injection  40 mg Subcutaneous Q24H  . feeding supplement (ENSURE ENLIVE)  237 mL Oral BID BM  . folic acid  1 mg Oral Daily  . lidocaine  1 patch Transdermal Q24H  . multivitamin with minerals  1 tablet Oral Daily  . pantoprazole   40 mg Oral Daily  . PARoxetine  30 mg Oral Daily  . thiamine  100 mg Oral Daily   Or  . thiamine  100 mg Intravenous Daily   Continuous Infusions:   Anti-infectives (From admission, onward)   None             Family Communication/Anticipated D/C date and plan/Code Status   DVT prophylaxis: enoxaparin (LOVENOX) injection 40 mg Start: 06/05/20 2200     Code Status: Full Code  Family Communication:  Disposition Plan:    Status is: Inpatient  Remains inpatient appropriate because:Unsafe d/c plan   Dispo: The patient is from: Home              Anticipated d/c is to: SNF              Anticipated d/c date is: 1 day              Patient currently is not medically stable to d/c.           Subjective:   No acute issues overnight.  He has intermittent pain in the right shoulder especially when he lays down.  Estill Bamberg, RN, at the bedside.  Objective:    Vitals:   06/24/20 0600 06/24/20 0626 06/24/20 0832 06/24/20 1515  BP: 117/63 117/63 121/84 127/85  Pulse: (!) 57 (!) 57 (!) 52 62  Resp:  16  16 16  Temp:  97.6 F (36.4 C) 97.8 F (36.6 C) 97.7 F (36.5 C)  TempSrc:  Oral Oral Oral  SpO2:  94% 91% 92%  Weight:      Height:       No data found.   Intake/Output Summary (Last 24 hours) at 06/24/2020 1544 Last data filed at 06/24/2020 1408 Gross per 24 hour  Intake 720 ml  Output 1625 ml  Net -905 ml   Filed Weights   06/15/20 0500 06/22/20 0500  Weight: 54.9 kg 68.5 kg    Exam:  GEN: No acute distress SKIN: No rash noted EYES: No pallor or icterus ENT: MMM CV: Regular rate and rhythm PULM: No wheezing or rales heard ABD: Soft and nontender CNS: Alert and oriented x3.  Chronic right upper extremity weakness.  Right arm in a sling. EXT: No edema or tenderness PSYCH: Calm and cooperative   Data Reviewed:   I have personally reviewed following labs and imaging studies:  Labs: Labs show the following:   Basic Metabolic Panel: Recent  Labs  Lab 06/23/20 0548  NA 139  K 3.9  CL 101  CO2 27  GLUCOSE 80  BUN 24*  CREATININE 0.55*  CALCIUM 9.4  MG 2.3  PHOS 3.8   GFR Estimated Creatinine Clearance: 73 mL/min (A) (by C-G formula based on SCr of 0.55 mg/dL (L)). Liver Function Tests: No results for input(s): AST, ALT, ALKPHOS, BILITOT, PROT, ALBUMIN in the last 168 hours. No results for input(s): LIPASE, AMYLASE in the last 168 hours. No results for input(s): AMMONIA in the last 168 hours. Coagulation profile No results for input(s): INR, PROTIME in the last 168 hours.  CBC: Recent Labs  Lab 06/23/20 0548  WBC 7.7  NEUTROABS 1.9  HGB 14.6  HCT 45.0  MCV 94.1  PLT 273   Cardiac Enzymes: No results for input(s): CKTOTAL, CKMB, CKMBINDEX, TROPONINI in the last 168 hours. BNP (last 3 results) No results for input(s): PROBNP in the last 8760 hours. CBG: No results for input(s): GLUCAP in the last 168 hours. D-Dimer: No results for input(s): DDIMER in the last 72 hours. Hgb A1c: No results for input(s): HGBA1C in the last 72 hours. Lipid Profile: No results for input(s): CHOL, HDL, LDLCALC, TRIG, CHOLHDL, LDLDIRECT in the last 72 hours. Thyroid function studies: No results for input(s): TSH, T4TOTAL, T3FREE, THYROIDAB in the last 72 hours.  Invalid input(s): FREET3 Anemia work up: No results for input(s): VITAMINB12, FOLATE, FERRITIN, TIBC, IRON, RETICCTPCT in the last 72 hours. Sepsis Labs: Recent Labs  Lab 06/23/20 0548  WBC 7.7    Microbiology No results found for this or any previous visit (from the past 240 hour(s)).  Procedures and diagnostic studies:  No results found.             LOS: 18 days   [place] Copywriter, advertising on [email or web address]. If 7PM-7AM, please contact night-coverage at [email or web address]     06/24/2020, 3:44 PM

## 2020-06-25 MED ORDER — BACLOFEN 10 MG PO TABS: 10.0000 mg | ORAL_TABLET | Freq: Three times a day (TID) | ORAL | Status: DC | PRN

## 2020-06-25 MED ORDER — BACLOFEN 10 MG PO TABS
10.0000 mg | ORAL_TABLET | Freq: Three times a day (TID) | ORAL | Status: DC
  Administered 2020-06-25 – 2020-06-26 (×3): 10 mg via ORAL
  Filled 2020-06-25 (×5): qty 1
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
## 2020-06-25 NOTE — Care Management Important Message (Signed)
Important Message  Patient Details  Name: Douglas Peterson. MRN: [id number] Date of Birth: 1951-05-23   Medicare Important Message Given:  Yes     [name] 06/25/2020, 11:29 AM

## 2020-06-25 NOTE — TOC Progression Note (Addendum)
Transition of Care [facility]) - CM/SW Discharge Note   Patient Details  Name: Douglas Peterson. MRN: [id number] Date of Birth: 08-15-1951  Transition of Care [facility]) CM/SW Contact:  Su Hilt, RN Phone Number: 06/25/2020, 1:11 PM   Clinical Narrative:    Damaris Schooner with [name] the patient's daughter, I notified her that [name] is denied the service to go to SNF, she stated that the patient is refusing to go to SNF and he plans to go home anyway, he refuses [facility] services as well, I requested to know what the plan is for him to have transportation home and she stated he will need to get a cab or other transportation, they family is npo longer going to offer assistants, DSS is attempting to obtain guardianship, I met with the patient and explained that [place] denied, he stated that he feels that he would be fine to go home, he goes to [employer] and uses the machines and feels that he has no needs, he does need transportation home and stated that he does not have money for a cab, I let him know I would arrange transportation with [facility] one the Doctor was ready to DC        Patient Goals and CMS Choice        Discharge Placement                       Discharge Plan and Services                                     Social Determinants of Health (SDOH) Interventions     Readmission Risk Interventions Readmission Risk Prevention Plan 05/19/2020 12/13/2019 12/12/2019  Transportation Screening Complete Complete Complete  PCP or Specialist Appt within 3-5 Days - (No Data) -  HRI or [place] - (No Data) -  Palliative Care Screening - Complete Complete  Medication Review (RN Care Manager) Complete Complete Complete  PCP or Specialist appointment within 3-5 days of discharge Complete - -  [place] or Home Care Consult Complete - -  SW Recovery Care/Counseling Consult Complete - -  Palliative Care Screening Not Applicable - -  [place] Not Applicable - -  Some  recent data might be hidden

## 2020-06-25 NOTE — TOC Progression Note (Addendum)
Transition of Care (TOC) - Progression Note    Patient Details  Name: Douglas Peterson. MRN: [id number] Date of Birth: October 03, 1951  Transition of Care [facility]) CM/SW Contact  Su Hilt, RN Phone Number: 06/25/2020, 12:11 PM  Clinical Narrative:   Wandra Feinstein called and stated that [employer] denied coverage, I called [name] the patients daughter to let her know of the denial, left a VM. For a call back          Expected Discharge Plan and Services                                                 Social Determinants of Health (SDOH) Interventions    Readmission Risk Interventions Readmission Risk Prevention Plan 05/19/2020 12/13/2019 12/12/2019  Transportation Screening Complete Complete Complete  PCP or Specialist Appt within 3-5 Days - (No Data) -  HRI or [place] - (No Data) -  Palliative Care Screening - Complete Complete  Medication Review (RN Care Manager) Complete Complete Complete  PCP or Specialist appointment within 3-5 days of discharge Complete - -  [place] or Home Care Consult Complete - -  SW Recovery Care/Counseling Consult Complete - -  Palliative Care Screening Not Applicable - -  [place] Not Applicable - -  Some recent data might be hidden

## 2020-06-25 NOTE — TOC Progression Note (Signed)
Transition of Care (TOC) - Progression Note    Patient Details  Name: Douglas Peterson. MRN: [id number] Date of Birth: [birth year]  Transition of Care [facility]) CM/SW Contact  Su Hilt, RN Phone Number: 06/25/2020, 4:21 PM  Clinical Narrative:   Rejeana Brock transportation services to set up Transport to home at DC tomorrow, Was on hold for more than 15 min with no answer, will call back         Expected Discharge Plan and Services                                                 Social Determinants of Health (SDOH) Interventions    Readmission Risk Interventions Readmission Risk Prevention Plan 05/19/2020 12/13/2019 12/12/2019  Transportation Screening Complete Complete Complete  PCP or Specialist Appt within 3-5 Days - (No Data) -  HRI or [place] - (No Data) -  Palliative Care Screening - Complete Complete  Medication Review ([place]) Complete Complete Complete  PCP or Specialist appointment within 3-5 days of discharge Complete - -  [place] or Home Care Consult Complete - -  SW Recovery Care/Counseling Consult Complete - -  Palliative Care Screening Not Applicable - -  [place] Not Applicable - -  Some recent data might be hidden

## 2020-06-25 NOTE — TOC Progression Note (Signed)
Transition of Care (TOC) - Progression Note    Patient Details  Name: Douglas Peterson. MRN: [id number] Date of Birth: [birth year]  Transition of Care [facility]) CM/SW Contact  Su Hilt, RN Phone Number: 06/25/2020, 11:24 AM  Clinical Narrative:    Jeralene Huff out to Ebony Hail to inquire about the Holli Humbles, She will check the status of the auth and let me know.        Expected Discharge Plan and Services                                                 Social Determinants of Health (SDOH) Interventions    Readmission Risk Interventions Readmission Risk Prevention Plan 05/19/2020 12/13/2019 12/12/2019  Transportation Screening Complete Complete Complete  PCP or Specialist Appt within 3-5 Days - (No Data) -  HRI or [place] - (No Data) -  Palliative Care Screening - Complete Complete  Medication Review (RN Care Manager) Complete Complete Complete  PCP or Specialist appointment within 3-5 days of discharge Complete - -  [place] or Home Care Consult Complete - -  SW Recovery Care/Counseling Consult Complete - -  Palliative Care Screening Not Applicable - -  [place] Not Applicable - -  Some recent data might be hidden

## 2020-06-25 NOTE — Progress Notes (Signed)
Progress Note    Douglas Peterson.  HWE:[id number] DOB: [birth year]  DOA: 06/05/2020 PCP: [name], MD      Brief Narrative:    Medical records reviewed and are as summarized below:  Douglas Peterson. is a 69 y.o. male       Assessment/Plan:   Principal Problem:   Alcohol withdrawal ([place]) Active Problems:   Diarrhea   Pressure injury of ankle, stage 1   Alcoholic intoxication with complication ([place])   ETOH abuse   Depression   Severe protein-calorie malnutrition ([name])   Fall   Rhabdomyolysis   Closed fracture of distal clavicle_Right   GERD (gastroesophageal reflux disease)   Nutrition Problem: Severe Malnutrition Etiology: social / environmental circumstances (EtOH abuse)  Signs/Symptoms: severe fat depletion, moderate muscle depletion, severe muscle depletion    Alcohol use disorder with recent alcohol intoxication S/p fall at home Dysphagia Hypertension Sinus bradycardia, asymptomatic Depression Mild traumatic rhabdomyolysis-resolved Severe protein calorie malnutrition Minimally displaced distal right clavicle fracture  PLAN  Patient requested the baclofen be increased from 10 mg 3 times daily.  He said he usually takes scheduled baclofen. Continue current medications Insurance authorization was denied. Discharge plan was discussed with case manager.  Plan to discharge him home tomorrow.   Body mass index is 25.92 kg/m.  Diet Order            Diet regular Room service appropriate? Yes; Fluid consistency: Thin  Diet effective now                     Medications:   . acetaminophen  650 mg Oral Q8H  . ascorbic acid  1,000 mg Oral Daily  . aspirin EC  81 mg Oral Daily  . atenolol  25 mg Oral BID  . baclofen  10 mg Oral TID  . calcium-vitamin D  1 tablet Oral Q breakfast  . cholecalciferol  5,000 Units Oral Daily  . enoxaparin (LOVENOX) injection  40 mg Subcutaneous Q24H  . feeding supplement (ENSURE ENLIVE)  237 mL Oral BID  BM  . folic acid  1 mg Oral Daily  . lidocaine  1 patch Transdermal Q24H  . multivitamin with minerals  1 tablet Oral Daily  . pantoprazole  40 mg Oral Daily  . PARoxetine  30 mg Oral Daily  . thiamine  100 mg Oral Daily   Or  . thiamine  100 mg Intravenous Daily   Continuous Infusions:   Anti-infectives (From admission, onward)   None             Family Communication/Anticipated D/C date and plan/Code Status   DVT prophylaxis: enoxaparin (LOVENOX) injection 40 mg Start: 06/05/20 2200     Code Status: Full Code  Family Communication:  Disposition Plan:    Status is: Inpatient  Remains inpatient appropriate because:Unsafe d/c plan   Dispo: The patient is from: Home              Anticipated d/c is to: SNF              Anticipated d/c date is: 1 day              Patient currently is not medically stable to d/c.           Subjective:   Pain in the right shoulder is better.  Overall, he feels his condition is improved.  He wants to go home.   Objective:    Vitals:  06/24/20 0832 06/24/20 1515 06/25/20 0735 06/25/20 1515  BP: 121/84 127/85 (!) 111/55 138/81  Pulse: (!) 52 62 (!) 53 64  Resp: 16 16 16 16   Temp: 97.8 F (36.6 C) 97.7 F (36.5 C) 97.6 F (36.4 C) 97.7 F (36.5 C)  TempSrc: Oral Oral Oral Oral  SpO2: 91% 92% 97% 95%  Weight:      Height:       No data found.   Intake/Output Summary (Last 24 hours) at 06/25/2020 2042 Last data filed at 06/25/2020 1850 Gross per 24 hour  Intake 240 ml  Output --  Net 240 ml   Filed Weights   06/15/20 0500 06/22/20 0500  Weight: 54.9 kg 68.5 kg    Exam:  GEN: NAD SKIN: Warm and dry EYES: Anicteric ENT: MMM CV: No murmurs or tachycardia PULM: Clear to auscultation bilaterally ABD: Soft, nontender CNS: Alert.  Chronic right upper extremity weakness. EXT: No swelling or tenderness PSYCH: No agitation   Data Reviewed:   I have personally reviewed following labs and imaging  studies:  Labs: Labs show the following:   Basic Metabolic Panel: Recent Labs  Lab 06/23/20 0548  NA 139  K 3.9  CL 101  CO2 27  GLUCOSE 80  BUN 24*  CREATININE 0.55*  CALCIUM 9.4  MG 2.3  PHOS 3.8   GFR Estimated Creatinine Clearance: 73 mL/min (A) (by C-G formula based on SCr of 0.55 mg/dL (L)). Liver Function Tests: No results for input(s): AST, ALT, ALKPHOS, BILITOT, PROT, ALBUMIN in the last 168 hours. No results for input(s): LIPASE, AMYLASE in the last 168 hours. No results for input(s): AMMONIA in the last 168 hours. Coagulation profile No results for input(s): INR, PROTIME in the last 168 hours.  CBC: Recent Labs  Lab 06/23/20 0548  WBC 7.7  NEUTROABS 1.9  HGB 14.6  HCT 45.0  MCV 94.1  PLT 273   Cardiac Enzymes: No results for input(s): CKTOTAL, CKMB, CKMBINDEX, TROPONINI in the last 168 hours. BNP (last 3 results) No results for input(s): PROBNP in the last 8760 hours. CBG: No results for input(s): GLUCAP in the last 168 hours. D-Dimer: No results for input(s): DDIMER in the last 72 hours. Hgb A1c: No results for input(s): HGBA1C in the last 72 hours. Lipid Profile: No results for input(s): CHOL, HDL, LDLCALC, TRIG, CHOLHDL, LDLDIRECT in the last 72 hours. Thyroid function studies: No results for input(s): TSH, T4TOTAL, T3FREE, THYROIDAB in the last 72 hours.  Invalid input(s): FREET3 Anemia work up: No results for input(s): VITAMINB12, FOLATE, FERRITIN, TIBC, IRON, RETICCTPCT in the last 72 hours. Sepsis Labs: Recent Labs  Lab 06/23/20 0548  WBC 7.7    Microbiology No results found for this or any previous visit (from the past 240 hour(s)).  Procedures and diagnostic studies:  No results found.             LOS: 19 days   [place] Copywriter, advertising on [email or web address]. If 7PM-7AM, please contact night-coverage at [email or web address]     06/25/2020, 8:42 PM

## 2020-06-26 MED ORDER — BACLOFEN 5 MG PO TABS: 10.0000 mg | ORAL_TABLET | Freq: Three times a day (TID) | ORAL | Status: DC

## 2020-06-26 NOTE — Progress Notes (Signed)
Discharge summary reviewed and gave to patient. [facility] transport is to transport home
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
## 2020-06-26 NOTE — TOC Progression Note (Addendum)
Transition of Care (TOC) - Progression Note    Patient Details  Name: Douglas Peterson. MRN: [id number] Date of Birth: 1951-06-17  Transition of Care [facility]) CM/SW Contact  Su Hilt, RN Phone Number: 06/26/2020, 8:56 AM  Clinical Narrative:   [name] services to arrange transportation , provided the address and the MRN, will be picked up at 1 PM and the driver will call when they arrive to the nurses desk         Expected Discharge Plan and Services                                                 Social Determinants of Health (SDOH) Interventions    Readmission Risk Interventions Readmission Risk Prevention Plan 05/19/2020 12/13/2019 12/12/2019  Transportation Screening Complete Complete Complete  PCP or Specialist Appt within 3-5 Days - (No Data) -  HRI or [place] - (No Data) -  Palliative Care Screening - Complete Complete  Medication Review ([place]) Complete Complete Complete  PCP or Specialist appointment within 3-5 days of discharge Complete - -  [place] or Home Care Consult Complete - -  SW Recovery Care/Counseling Consult Complete - -  Palliative Care Screening Not Applicable - -  [name] Not Applicable - -  Some recent data might be hidden

## 2020-06-26 NOTE — TOC Progression Note (Signed)
Transition of Care (TOC) - Progression Note    Patient Details  Name: Douglas Peterson. MRN: [id number] Date of Birth: 1951-09-11  Transition of Care [facility]) CM/SW Contact  Su Hilt, RN Phone Number: 06/26/2020, 9:45 AM  Clinical Narrative:   Aretha Parrot with DSS to notify her that the patient will DC to home today         Expected Discharge Plan and Services                                                 Social Determinants of Health (SDOH) Interventions    Readmission Risk Interventions Readmission Risk Prevention Plan 05/19/2020 12/13/2019 12/12/2019  Transportation Screening Complete Complete Complete  PCP or Specialist Appt within 3-5 Days - (No Data) -  HRI or [place] - (No Data) -  Palliative Care Screening - Complete Complete  Medication Review ([place]) Complete Complete Complete  PCP or Specialist appointment within 3-5 days of discharge Complete - -  [place] or Home Care Consult Complete - -  SW Recovery Care/Counseling Consult Complete - -  Palliative Care Screening Not Applicable - -  [place] Not Applicable - -  Some recent data might be hidden

## 2020-06-27 NOTE — Discharge Summary (Signed)
Physician Discharge Summary  Douglas Peterson. XKG:[id number] DOB: 11-24-1950 DOA: 06/05/2020  PCP: [name], MD  Admit date: 06/05/2020 Discharge date: 06/27/2020  Discharge disposition: Home   Recommendations for Outpatient Follow-Up:   Follow up with PCP on Friday, 06/29/2020. Follow up with orthopedic surgeon in 1 week   Discharge Diagnosis:   Principal Problem:   Alcohol withdrawal ([name]) Active Problems:   Diarrhea   Pressure injury of ankle, stage 1   Alcoholic intoxication with complication (HCC)   ETOH abuse   Depression   Severe protein-calorie malnutrition (HCC)   Fall   Rhabdomyolysis   Closed fracture of distal clavicle_Right   GERD (gastroesophageal reflux disease)    Discharge Condition: Stable.  Diet recommendation:  Diet Order            Diet - low sodium heart healthy                   Code Status: Full code     Hospital Course:   70 year old male with history of HTN, GERD, depression, A. fib, severe protein calorie malnutrition, alcohol abuse chronic right-sided weakness presenting with right shoulder pain after fall at home, and admitted for alcohol intoxication/withdrawal, rhabdomyolysis and right distal clavicular fracture. CT head without acute finding. Psychiatry consulted to assess for capacity to determine guardianship by DSS. Initially, patient was deemed not to have capacity to make decision for himself. DSS is going to apply for guardianship.  PT recommended home health PT and OT recommended that patient be discharged to SNF.  Attempts were made to place the patient in a skilled nursing facility.  Unfortunately, insurance authorization was denied.  Patient has been able to ambulate without any assistance from staff.  He insisted on going home and said he will be fine at home.  He has been advised to avoid alcohol or any illicit drugs.  He is deemed stable for discharge to home.       Discharge Exam:    Vitals:   06/25/20  0735 06/25/20 1515 06/26/20 0105 06/26/20 0838  BP: (!) 111/55 138/81 (!) 144/90 140/86  Pulse: (!) 53 64 62 (!) 53  Resp: 16 16 18 14   Temp: 97.6 F (36.4 C) 97.7 F (36.5 C) 97.8 F (36.6 C) (!) 97.5 F (36.4 C)  TempSrc: Oral Oral Oral Oral  SpO2: 97% 95% 96% 99%  Weight:      Height:         GEN: NAD SKIN: No rash EYES: EOMI ENT: MMM CV: RRR PULM: CTA B ABD: soft, ND, NT, +BS CNS: AAO x 4, chronic right upper extremity weakness EXT: No edema or tenderness   The results of significant diagnostics from this hospitalization (including imaging, microbiology, ancillary and laboratory) are listed below for reference.     Procedures and Diagnostic Studies:   DG Shoulder Right  Result Date: 06/05/2020 CLINICAL DATA:  Fall EXAM: RIGHT SHOULDER - 2+ VIEW COMPARISON:  None. FINDINGS: There is a minimally displaced fracture of the distal right clavicle. There is a chronic posttraumatic deformity of proximal right humerus but no acute fracture. Glenohumeral joint is approximated. IMPRESSION: Minimally displaced fracture of the distal right clavicle. Electronically Signed   By: Ulyses Jarred M.D.   On: 06/05/2020 02:15   CT HEAD WO CONTRAST  Result Date: 06/05/2020 CLINICAL DATA:  69 year old male with head trauma. EXAM: CT HEAD WITHOUT CONTRAST TECHNIQUE: Contiguous axial images were obtained from the base of the skull through the vertex without  intravenous contrast. COMPARISON:  Head CT dated 03/17/2020. FINDINGS: Brain: The ventricles and sulci appropriate size for patient's age. Minimal periventricular and deep white matter chronic microvascular ischemic changes noted. There is no acute intracranial hemorrhage. No mass effect or midline shift. No extra-axial fluid collection. Vascular: No hyperdense vessel or unexpected calcification. Skull: Normal. Negative for fracture or focal lesion. Sinuses/Orbits: No acute finding. Other: None IMPRESSION: No acute intracranial pathology.  Electronically Signed   By: Anner Crete M.D.   On: 06/05/2020 16:21   DG Chest Portable 1 View  Result Date: 06/05/2020 CLINICAL DATA:  Patient was found on the ground for unknown time. Possible aspiration. EXAM: PORTABLE CHEST 1 VIEW COMPARISON:  05/15/2020 FINDINGS: Shallow inspiration. Heart size and pulmonary vascularity are normal for technique. Lungs are grossly clear. No pleural effusions. No pneumothorax. Fracture of the distal right clavicle appears acute. Old appearing fracture deformity of the proximal right humerus. IMPRESSION: Shallow inspiration. No evidence of active pulmonary disease. Fracture of the distal right clavicle. Electronically Signed   By: Lucienne Capers M.D.   On: 06/05/2020 03:20     Labs:   Basic Metabolic Panel: Recent Labs  Lab 06/23/20 0548  NA 139  K 3.9  CL 101  CO2 27  GLUCOSE 80  BUN 24*  CREATININE 0.55*  CALCIUM 9.4  MG 2.3  PHOS 3.8   GFR Estimated Creatinine Clearance: 73 mL/min (A) (by C-G formula based on SCr of 0.55 mg/dL (L)). Liver Function Tests: No results for input(s): AST, ALT, ALKPHOS, BILITOT, PROT, ALBUMIN in the last 168 hours. No results for input(s): LIPASE, AMYLASE in the last 168 hours. No results for input(s): AMMONIA in the last 168 hours. Coagulation profile No results for input(s): INR, PROTIME in the last 168 hours.  CBC: Recent Labs  Lab 06/23/20 0548  WBC 7.7  NEUTROABS 1.9  HGB 14.6  HCT 45.0  MCV 94.1  PLT 273   Cardiac Enzymes: No results for input(s): CKTOTAL, CKMB, CKMBINDEX, TROPONINI in the last 168 hours. BNP: Invalid input(s): POCBNP CBG: No results for input(s): GLUCAP in the last 168 hours. D-Dimer No results for input(s): DDIMER in the last 72 hours. Hgb A1c No results for input(s): HGBA1C in the last 72 hours. Lipid Profile No results for input(s): CHOL, HDL, LDLCALC, TRIG, CHOLHDL, LDLDIRECT in the last 72 hours. Thyroid function studies No results for input(s): TSH, T4TOTAL,  T3FREE, THYROIDAB in the last 72 hours.  Invalid input(s): FREET3 Anemia work up No results for input(s): VITAMINB12, FOLATE, FERRITIN, TIBC, IRON, RETICCTPCT in the last 72 hours. Microbiology No results found for this or any previous visit (from the past 240 hour(s)).   Discharge Instructions:   Discharge Instructions    Diet - low sodium heart healthy   Complete by: As directed    Discharge wound care:   Complete by: As directed    Keep wound clean and dry.  Apply Mepilex border daily.   Increase activity slowly   Complete by: As directed      Allergies as of 06/26/2020   No Known Allergies     Medication List    STOP taking these medications   ascorbic acid 1000 MG tablet Commonly known as: VITAMIN C     TAKE these medications   acetaminophen 500 MG tablet Commonly known as: TYLENOL Take 500 mg by mouth every 6 (six) hours as needed.   aspirin EC 81 MG tablet Take 81 mg by mouth daily. Swallow whole.   atenolol  25 MG tablet Commonly known as: TENORMIN Take 25 mg by mouth 2 (two) times daily.   Baclofen 5 MG Tabs Take 10 mg by mouth 3 (three) times daily. What changed:   how much to take  when to take this  reasons to take this   calcium-vitamin D 500-200 MG-UNIT tablet Commonly known as: OSCAL WITH D Take 1 tablet by mouth daily with breakfast.   Cholecalciferol 125 MCG (5000 UT) Tabs Take 5,000 Units by mouth daily.   docusate sodium 100 MG capsule Commonly known as: Colace Take 1 capsule (100 mg total) by mouth 2 (two) times daily. Notes to patient: Not given in hospital   doxepin 10 MG capsule Commonly known as: SINEQUAN Take 10 mg by mouth at bedtime as needed. Notes to patient: Not given in hospital   feeding supplement (ENSURE ENLIVE) Liqd Take 237 mLs by mouth 2 (two) times daily between meals.   folic acid 1 MG tablet Commonly known as: FOLVITE Take 1 tablet (1 mg total) by mouth daily.   lactulose 10 GM/15ML solution Commonly  known as: CHRONULAC Take 15 mLs (10 g total) by mouth daily. Notes to patient: Not given in hospital   multivitamin with minerals Tabs tablet Take 1 tablet by mouth daily.   omeprazole 20 MG tablet Commonly known as: PriLOSEC OTC Take 1 tablet (20 mg total) by mouth daily. Notes to patient: Not given in hospital   PARoxetine 30 MG tablet Commonly known as: PAXIL Take 1 tablet (30 mg total) by mouth daily. Take 1 tablet (30 mg total) by mouth once daily For anxiety   thiamine 100 MG tablet Take 1 tablet (100 mg total) by mouth daily.            Discharge Care Instructions  (From admission, onward)         Start     Ordered   06/26/20 0000  Discharge wound care:       Comments: Keep wound clean and dry.  Apply Mepilex border daily.   06/26/20 1129          Contact information for follow-up providers    Schedule an appointment as soon as possible for a visit  with [name], MD.   Specialty: Internal Medicine Contact information: [place] [address] [phone]        [facility] ([facility]). Schedule an appointment as soon as possible for a visit in 1 week(s).   Why: Right clavicle fracture Contact information: [place] [phone] [place] [address] [phone]                      Time coordinating discharge: 28 minutes  Signed:  Jennye Boroughs  [facility] 06/27/2020, 11:36 AM   Pager on [email or web address]. If 7PM-7AM, please contact night-coverage at [email or web address]

## 2020-06-30 ENCOUNTER — Emergency Department: Payer: Medicare HMO

## 2020-06-30 ENCOUNTER — Encounter: Payer: Self-pay | Admitting: Emergency Medicine

## 2020-06-30 ENCOUNTER — Other Ambulatory Visit: Payer: Self-pay

## 2020-06-30 ENCOUNTER — Emergency Department
Admission: EM | Admit: 2020-06-30 | Discharge: 2020-07-02 | Disposition: A | Discharge status: Home / Self Care | Payer: Medicare HMO | Attending: Emergency Medicine | Admitting: Emergency Medicine

## 2020-06-30 DIAGNOSIS — S42021A Displaced fracture of shaft of right clavicle, initial encounter for closed fracture: Secondary | ICD-10-CM | POA: Insufficient documentation

## 2020-06-30 DIAGNOSIS — Z79899 Other long term (current) drug therapy: Secondary | ICD-10-CM | POA: Insufficient documentation

## 2020-06-30 DIAGNOSIS — F1010 Alcohol abuse, uncomplicated: Secondary | ICD-10-CM | POA: Diagnosis not present

## 2020-06-30 DIAGNOSIS — Z87891 Personal history of nicotine dependence: Secondary | ICD-10-CM | POA: Diagnosis not present

## 2020-06-30 DIAGNOSIS — Y92009 Unspecified place in unspecified non-institutional (private) residence as the place of occurrence of the external cause: Secondary | ICD-10-CM | POA: Diagnosis not present

## 2020-06-30 DIAGNOSIS — Z7982 Long term (current) use of aspirin: Secondary | ICD-10-CM | POA: Diagnosis not present

## 2020-06-30 DIAGNOSIS — I10 Essential (primary) hypertension: Secondary | ICD-10-CM | POA: Insufficient documentation

## 2020-06-30 DIAGNOSIS — Y999 Unspecified external cause status: Secondary | ICD-10-CM | POA: Diagnosis not present

## 2020-06-30 DIAGNOSIS — W010XXA Fall on same level from slipping, tripping and stumbling without subsequent striking against object, initial encounter: Secondary | ICD-10-CM | POA: Diagnosis not present

## 2020-06-30 DIAGNOSIS — S0990XA Unspecified injury of head, initial encounter: Secondary | ICD-10-CM | POA: Insufficient documentation

## 2020-06-30 DIAGNOSIS — F1020 Alcohol dependence, uncomplicated: Secondary | ICD-10-CM

## 2020-06-30 DIAGNOSIS — Y939 Activity, unspecified: Secondary | ICD-10-CM | POA: Insufficient documentation

## 2020-06-30 DIAGNOSIS — S4991XA Unspecified injury of right shoulder and upper arm, initial encounter: Secondary | ICD-10-CM | POA: Diagnosis present

## 2020-06-30 MED ORDER — IBUPROFEN 800 MG PO TABS
800.0000 mg | ORAL_TABLET | Freq: Once | ORAL | Status: AC
  Administered 2020-06-30: 800 mg via ORAL
  Filled 2020-06-30: qty 1

## 2020-06-30 MED ORDER — BACLOFEN 10 MG PO TABS
10.0000 mg | ORAL_TABLET | Freq: Once | ORAL | Status: AC
  Administered 2020-06-30: 10 mg via ORAL
  Filled 2020-06-30: qty 1

## 2020-06-30 NOTE — ED Notes (Signed)
Pt transported to CT ?
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
## 2020-07-01 MED ORDER — ACETAMINOPHEN 500 MG PO TABS
1000.0000 mg | ORAL_TABLET | Freq: Once | ORAL | Status: AC
  Administered 2020-07-01: 1000 mg via ORAL
  Filled 2020-07-01: qty 2

## 2020-07-01 MED ORDER — LORAZEPAM 2 MG/ML IJ SOLN: 0.0000 mg | Freq: Two times a day (BID) | INTRAMUSCULAR | Status: DC

## 2020-07-01 MED ORDER — ACETAMINOPHEN 500 MG PO TABS: 1000.0000 mg | ORAL_TABLET | Freq: Once | ORAL | Status: AC

## 2020-07-01 MED ORDER — LORAZEPAM 2 MG PO TABS: 0.0000 mg | ORAL_TABLET | Freq: Two times a day (BID) | ORAL | Status: DC

## 2020-07-01 MED ORDER — THIAMINE HCL 100 MG/ML IJ SOLN: 100.0000 mg | Freq: Every day | INTRAMUSCULAR | Status: DC

## 2020-07-01 MED ORDER — ACETAMINOPHEN 500 MG PO TABS
ORAL_TABLET | ORAL | Status: AC
  Administered 2020-07-01: 1000 mg via ORAL
  Filled 2020-07-01: qty 2

## 2020-07-01 MED ORDER — THIAMINE HCL 100 MG PO TABS
100.0000 mg | ORAL_TABLET | Freq: Every day | ORAL | Status: DC
  Administered 2020-07-01: 100 mg via ORAL
  Filled 2020-07-01 (×2): qty 1

## 2020-07-01 MED ORDER — LORAZEPAM 2 MG PO TABS
0.0000 mg | ORAL_TABLET | Freq: Four times a day (QID) | ORAL | Status: DC
  Administered 2020-07-01 – 2020-07-02 (×3): 1 mg via ORAL
  Administered 2020-07-02: 2 mg via ORAL
  Filled 2020-07-01 (×4): qty 1

## 2020-07-01 MED ORDER — LORAZEPAM 2 MG/ML IJ SOLN: 0.0000 mg | Freq: Four times a day (QID) | INTRAMUSCULAR | Status: DC

## 2020-07-01 NOTE — ED Notes (Signed)
Pt stated he needed to pee. Privacy screens placed around bed in hallway. Pt assisted with use of urinal.

## 2020-07-01 NOTE — ED Notes (Signed)
Pt given breakfast tray

## 2020-07-01 NOTE — ED Notes (Signed)
Pt resting quietly with his eyes closed, no distress noted, cont to monitor

## 2020-07-01 NOTE — TOC Initial Note (Signed)
Transition of Care (TOC) - Initial/Assessment Note    Patient Details  Name: Douglas Peterson. MRN: [id number] Date of Birth: 09/05/1951  Transition of Care [facility]) CM/SW Contact:    Berenice Bouton, LCSW Phone Number: 07/01/2020, 9:44 AM  Clinical Narrative:  [name] a 69 year old Caucasian male presented to [facility] ED after a fall.  Patient has a history of multiple hospital visits, alcohol dependence and rehab stays.  Patient lives alone. Patient reported that his primary care physician (PCP) is Dr. Mancel Bale, [facility]  [address]  [place], [place] [address]  - [phone].  If patient is admitted he would need assistance making follow-up appointment.   Patient agreeable to SNF or [place] upon discharge.          Expected Discharge Plan: [place] Barriers to Discharge: Continued Medical Work up   Patient Goals and CMS Choice Patient states their goals for this hospitalization and ongoing recovery are:: "want to go home with no pain."  Agreable to [facility] or SNF CMS Medicare.gov Compare Post Acute Care list provided to:: Patient Choice offered to / list presented to : Patient  Expected Discharge Plan and Services Expected Discharge Plan: [place] In-house Referral: Clinical Social Work   Post Acute Care Choice: Home Health, Durable Medical Equipment Living arrangements for the past 2 months: [place]                     Prior Living Arrangements/Services Living arrangements for the past 2 months: Single Family Home Lives with:: Self Patient language and need for interpreter reviewed:: No Do you feel safe going back to the place where you live?: Yes      Need for Family Participation in Patient Care: No (Comment) Care giver support system in place?: No (comment)   Criminal Activity/Legal Involvement Pertinent to Current Situation/Hospitalization: No - Comment as needed  Activities of Daily Living       Permission Sought/Granted Permission sought to share information with : Case Manager, Customer service manager Permission granted to share information with : Yes, Verbal Permission Granted  Share Information with NAME: [name] (Daughter) [phone] (Mobile)  Permission granted to share info w AGENCY: SNF        Emotional Assessment Appearance:: Appears older than stated age Attitude/Demeanor/Rapport: Engaged Affect (typically observed): Accepting, Calm Orientation: : Oriented to Self, Oriented to Place, Oriented to  Time, Oriented to Situation Alcohol / Substance Use: Alcohol Use Psych Involvement: No (comment)  Admission diagnosis:  Fall Patient Active Problem List   Diagnosis Date Noted   Closed fracture of distal clavicle_Right 06/05/2020   GERD (gastroesophageal reflux disease) 06/05/2020   Alcohol use disorder, severe, in early remission, dependence ([place]) 05/28/2020   Acquired thrombophilia ([place])    Elevated LFTs 04/06/2020   Fall 04/06/2020   Rhabdomyolysis 04/06/2020   Elevated lactic acid level 04/06/2020   Sepsis ([place]) 03/17/2020   Constipation 03/17/2020   Paroxysmal A-fib ([place]) 03/17/2020   Severe protein-calorie malnutrition ([place]) 02/25/2020   Left inguinal hernia 01/31/2020   Encounter for competency evaluation    Depression 01/17/2020   Esophagitis    Nausea without vomiting    Weakness    Tremor    Pancytopenia (HCC)    Hypomagnesemia    Alcoholic cirrhosis of liver without ascites ([place])    Thrombocytopenia ([place])    Non-traumatic rhabdomyolysis    Alcohol withdrawal delirium ([place]) 12/08/2019   ETOH abuse  Alcoholic intoxication with complication ([place])    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[place]) 09/27/2019   Hypokalemia 09/27/2019   Dysphagia 09/27/2019   Alcohol withdrawal delirium, acute, hyperactive ([place]) 09/21/2019   Pressure injury of ankle, stage 1 08/15/2019   Goals of  care, counseling/discussion    Palliative care by specialist    Alcohol withdrawal ([place]) 03/20/2019   Low back pain 86/82/5749   Acute metabolic encephalopathy 35/52/1747   Delirium tremens ([place]) 03/08/2018   Malnutrition of moderate degree 11/24/2017   HTN (hypertension) 09/16/2017   Hypothermia 12/17/2016   Alcohol use    Diarrhea    Hyponatremia 07/09/2015   PCP:  [name], MD Pharmacy:   [facility] [address], [place] - [place] [address] [place] [address] Phone: [phone] Fax: [phone]     Social Determinants of Health (SDOH) Interventions    Readmission Risk Interventions Readmission Risk Prevention Plan 05/19/2020 12/13/2019 12/12/2019  Transportation Screening Complete Complete Complete  PCP or Specialist Appt within 3-5 Days - (No Data) -  HRI or [place] - (No Data) -  Palliative Care Screening - Complete Complete  Medication Review (RN Care Manager) Complete Complete Complete  PCP or Specialist appointment within 3-5 days of discharge Complete - -  [place] or Home Care Consult Complete - -  SW Recovery Care/Counseling Consult Complete - -  Palliative Care Screening Not Applicable - -  [place] Not Applicable - -  Some recent data might be hidden

## 2020-07-01 NOTE — ED Notes (Signed)
[name] brief and sheets wet at this time. This RN changed the patient sheets, chux pad, and patient placed in new brief. Patient clean/dry at this time and comfortable in bed. Patient given new blankets.

## 2020-07-01 NOTE — ED Notes (Signed)
Report to brandy, rn.

## 2020-07-01 NOTE — ED Notes (Signed)
Pt cleansed of incontinent urine.

## 2020-07-01 NOTE — ED Notes (Signed)
Pt moved via stretcher to decon room and cleaned of incontinence. Peri care performed. New brief applied. Pt instructed to notify nursing staff when he felt the urge to urinate.

## 2020-07-02 MED ORDER — FOLIC ACID 1 MG PO TABS
1.0000 mg | ORAL_TABLET | Freq: Every day | ORAL | Status: DC
  Administered 2020-07-02: 1 mg via ORAL
  Filled 2020-07-02: qty 1

## 2020-07-02 MED ORDER — BACLOFEN 10 MG PO TABS
10.0000 mg | ORAL_TABLET | Freq: Three times a day (TID) | ORAL | Status: DC
  Administered 2020-07-02 (×2): 10 mg via ORAL
  Filled 2020-07-02 (×3): qty 1

## 2020-07-02 MED ORDER — ACETAMINOPHEN 500 MG PO TABS
1000.0000 mg | ORAL_TABLET | Freq: Once | ORAL | Status: AC
  Administered 2020-07-02: 1000 mg via ORAL
  Filled 2020-07-02: qty 2

## 2020-07-02 MED ORDER — PANTOPRAZOLE SODIUM 40 MG PO TBEC
40.0000 mg | DELAYED_RELEASE_TABLET | Freq: Every day | ORAL | Status: DC
  Administered 2020-07-02: 40 mg via ORAL
  Filled 2020-07-02: qty 1

## 2020-07-02 MED ORDER — IBUPROFEN 400 MG PO TABS
400.0000 mg | ORAL_TABLET | Freq: Once | ORAL | Status: AC
  Administered 2020-07-02: 400 mg via ORAL
  Filled 2020-07-02: qty 1

## 2020-07-02 MED ORDER — ATENOLOL 25 MG PO TABS
25.0000 mg | ORAL_TABLET | Freq: Two times a day (BID) | ORAL | Status: DC
  Administered 2020-07-02: 25 mg via ORAL
  Filled 2020-07-02: qty 1

## 2020-07-02 MED ORDER — THIAMINE HCL 100 MG PO TABS
100.0000 mg | ORAL_TABLET | Freq: Every day | ORAL | Status: DC
  Administered 2020-07-02: 100 mg via ORAL

## 2020-07-02 MED ORDER — DOXEPIN HCL 10 MG PO CAPS
10.0000 mg | ORAL_CAPSULE | Freq: Every evening | ORAL | Status: DC | PRN
  Filled 2020-07-02: qty 1

## 2020-07-02 MED ORDER — OMEPRAZOLE MAGNESIUM 20 MG PO TBEC: 20.0000 mg | DELAYED_RELEASE_TABLET | Freq: Every day | ORAL | Status: DC

## 2020-07-02 MED ORDER — ASPIRIN EC 81 MG PO TBEC
81.0000 mg | DELAYED_RELEASE_TABLET | Freq: Every day | ORAL | Status: DC
  Administered 2020-07-02: 81 mg via ORAL
  Filled 2020-07-02: qty 1

## 2020-07-02 NOTE — ED Notes (Signed)
Pt changed into clean brief and blue scrub pants.  Clean, warm blanket given.  Urinal emptied: 350 ml

## 2020-07-02 NOTE — ED Provider Notes (Signed)
Patient seen and evaluated on 8/28 for a drunken mechanical fall.  Patient has a longstanding history of recurrent intoxication and falls.  He has exhausted many resources for placement and home health services due to his history of noncompliance and showing up intoxicated to appointments.  He has been observed in our ED for the past 2 days after his most recent fall.  He has evidence of a distal right clavicle fracture due to this fall, and will require nonoperative management with sling for support.  Case management and social work have been working over the past 2 days to find additional resources or home health for the patient.  Unfortunately, he seems to have exhausted all resources and there are no additional options for home health.  They were able to arrange for occupational therapy to see the patient only.   At the time of this writing, patient sitting up in bed and finishing a sandwich.  Reports tolerating solid food without abdominal pain or vomiting.  Reports his pain is well controlled.  We discussed lack of home health resources available, and that we will be arranging for outpatient occupational therapy to assist the patient.  I urged the patient to abstain from alcohol, and he agrees to do so.  We discussed return precautions for the ED.  Patient medically stable for discharge home.   Vladimir Crofts, MD 07/02/20 1352

## 2020-07-02 NOTE — TOC Progression Note (Addendum)
Transition of Care (TOC) - Progression Note    Patient Details  Name: Douglas Peterson. MRN: [id number] Date of Birth: 04/01/1951  Transition of Care [facility]) CM/SW Contact  Anselm Pancoast, RN Phone Number: 07/02/2020, 10:59 AM  Clinical Narrative:    Spoke to patient at bedside and patient requesting to go home. States he does not need or want rehab placement. Patient states he typically uses a cane however he does not have it with him. Will need EMS transport for discharge home.   TOC RN CM LVMM for Roni Luther-[facility] worker following patient to update of ED visit and discharge home.   Spoke to daughter, [name], updated that patient was discharging home with home health via EMS. No other needs or questions.    Expected Discharge Plan: [place] Barriers to Discharge: Continued Medical Work up  Expected Discharge Plan and Services Expected Discharge Plan: [place] In-house Referral: Clinical Social Work   Post Acute Care Choice: Home Health, Durable Medical Equipment Living arrangements for the past 2 months: Single Family Home                                       Social Determinants of Health (SDOH) Interventions    Readmission Risk Interventions Readmission Risk Prevention Plan 05/19/2020 12/13/2019 12/12/2019  Transportation Screening Complete Complete Complete  PCP or Specialist Appt within 3-5 Days - (No Data) -  HRI or [place] - (No Data) -  Palliative Care Screening - Complete Complete  Medication Review (RN Care Manager) Complete Complete Complete  PCP or Specialist appointment within 3-5 days of discharge Complete - -  [place] or Home Care Consult Complete - -  SW Recovery Care/Counseling Consult Complete - -  Palliative Care Screening Not Applicable - -  [place] Not Applicable - -  Some recent data might be hidden

## 2020-07-02 NOTE — ED Notes (Signed)
Pt discharged home via EMS.  Discharge instructions given to patient.  VS stable.

## 2020-07-02 NOTE — ED Notes (Signed)
[place]

## 2020-07-02 NOTE — ED Notes (Signed)
Pt insisting we find his cell phone before leaving with EMS.  No documentation of any belongings. No bags found in storage room. Charge nurse made aware.

## 2020-07-02 NOTE — TOC Transition Note (Addendum)
Transition of Care [facility]) - CM/SW Discharge Note   Patient Details  Name: Jama Krichbaum. MRN: [id number] Date of Birth: 1951-06-20  Transition of Care [facility]) CM/SW Contact:  Anselm Pancoast, RN Phone Number: 07/02/2020, 2:44 PM   Clinical Narrative:    Faxed request for [facility] OP PT/OT to [phone]. Patient aware and in agreeance.  Spoke to [employer] @ [facility]-updated on ED visit and dc home with OP PT.    Final next level of care: [place] Barriers to Discharge: Continued Medical Work up   Patient Goals and CMS Choice Patient states their goals for this hospitalization and ongoing recovery are:: "want to go home with no pain."  Agreable to [facility] or SNF CMS Medicare.gov Compare Post Acute Care list provided to:: Patient Choice offered to / list presented to : Patient  Discharge Placement                       Discharge Plan and Services In-house Referral: Clinical Social Work   Post Acute Care Choice: Home Health, Durable Medical Equipment                               Social Determinants of Health (SDOH) Interventions     Readmission Risk Interventions Readmission Risk Prevention Plan 05/19/2020 12/13/2019 12/12/2019  Transportation Screening Complete Complete Complete  PCP or Specialist Appt within 3-5 Days - (No Data) -  HRI or [place] - (No Data) -  Palliative Care Screening - Complete Complete  Medication Review ([place]) Complete Complete Complete  PCP or Specialist appointment within 3-5 days of discharge Complete - -  [place] or Home Care Consult Complete - -  SW Recovery Care/Counseling Consult Complete - -  Palliative Care Screening Not Applicable - -  [place] Not Applicable - -  Some recent data might be hidden

## 2020-07-02 NOTE — TOC Progression Note (Addendum)
Transition of Care (TOC) - Progression Note    Patient Details  Name: Douglas Peterson. MRN: [id number] Date of Birth: Mar 30, 1951  Transition of Care [facility]) CM/SW Contact  Anselm Pancoast, RN Phone Number: 07/02/2020, 11:17 AM  Clinical Narrative:    Patient previously with [facility] and discharged due to being intoxicated during visits.   [facility], [facility], [facility], and [facility] unable to accept patient due to history of noncompliance.   Patient has no home health care resources available. Patient made aware and states he is fine without home health services. ED RN and EDP notified of lack of [place] resources due to history of noncompliance. Offered patient OP PT as option.    Expected Discharge Plan: [place] Barriers to Discharge: Continued Medical Work up  Expected Discharge Plan and Services Expected Discharge Plan: [place] In-house Referral: Clinical Social Work   Post Acute Care Choice: Home Health, Durable Medical Equipment Living arrangements for the past 2 months: Single Family Home                                       Social Determinants of Health (SDOH) Interventions    Readmission Risk Interventions Readmission Risk Prevention Plan 05/19/2020 12/13/2019 12/12/2019  Transportation Screening Complete Complete Complete  PCP or Specialist Appt within 3-5 Days - (No Data) -  HRI or [place] - (No Data) -  Palliative Care Screening - Complete Complete  Medication Review (RN Care Manager) Complete Complete Complete  PCP or Specialist appointment within 3-5 days of discharge Complete - -  [place] or Home Care Consult Complete - -  SW Recovery Care/Counseling Consult Complete - -  Palliative Care Screening Not Applicable - -  [place] Not Applicable - -  Some recent data might be hidden

## 2020-07-02 NOTE — Discharge Instructions (Addendum)
Please follow-up with your primary care doctor to keep an eye on your clavicle fracture.  If you do not have one you can try the [place] clinic or the [facility] clinic or the [facility] or [facility] or you can try [place] care.  Also it would be a good idea to try alcoholics anonymous.  That is probably the most useful thing we have for people to drink too much.  Some people end up going every day or even twice a day for a while.  He is return for any further problems.

## 2020-07-05 ENCOUNTER — Other Ambulatory Visit: Payer: Self-pay

## 2020-07-05 ENCOUNTER — Emergency Department: Payer: Medicare HMO

## 2020-07-05 ENCOUNTER — Encounter: Payer: Self-pay | Admitting: Emergency Medicine

## 2020-07-05 ENCOUNTER — Emergency Department
Admission: EM | Admit: 2020-07-05 | Discharge: 2020-07-06 | Disposition: A | Discharge status: Home / Self Care | Payer: Medicare HMO | Attending: Emergency Medicine | Admitting: Emergency Medicine

## 2020-07-05 DIAGNOSIS — Y929 Unspecified place or not applicable: Secondary | ICD-10-CM | POA: Insufficient documentation

## 2020-07-05 DIAGNOSIS — Y939 Activity, unspecified: Secondary | ICD-10-CM | POA: Diagnosis not present

## 2020-07-05 DIAGNOSIS — Z79899 Other long term (current) drug therapy: Secondary | ICD-10-CM | POA: Insufficient documentation

## 2020-07-05 DIAGNOSIS — W19XXXA Unspecified fall, initial encounter: Secondary | ICD-10-CM

## 2020-07-05 DIAGNOSIS — R296 Repeated falls: Secondary | ICD-10-CM | POA: Diagnosis not present

## 2020-07-05 DIAGNOSIS — S42031D Displaced fracture of lateral end of right clavicle, subsequent encounter for fracture with routine healing: Secondary | ICD-10-CM

## 2020-07-05 DIAGNOSIS — S7001XA Contusion of right hip, initial encounter: Secondary | ICD-10-CM | POA: Diagnosis not present

## 2020-07-05 DIAGNOSIS — I10 Essential (primary) hypertension: Secondary | ICD-10-CM | POA: Diagnosis not present

## 2020-07-05 DIAGNOSIS — Y999 Unspecified external cause status: Secondary | ICD-10-CM | POA: Insufficient documentation

## 2020-07-05 DIAGNOSIS — S4991XA Unspecified injury of right shoulder and upper arm, initial encounter: Secondary | ICD-10-CM | POA: Diagnosis present

## 2020-07-05 DIAGNOSIS — W1839XA Other fall on same level, initial encounter: Secondary | ICD-10-CM | POA: Diagnosis not present

## 2020-07-05 DIAGNOSIS — Z87891 Personal history of nicotine dependence: Secondary | ICD-10-CM | POA: Diagnosis not present

## 2020-07-05 DIAGNOSIS — S42031A Displaced fracture of lateral end of right clavicle, initial encounter for closed fracture: Secondary | ICD-10-CM | POA: Insufficient documentation

## 2020-07-05 DIAGNOSIS — Z7982 Long term (current) use of aspirin: Secondary | ICD-10-CM | POA: Diagnosis not present

## 2020-07-05 MED ORDER — TRAMADOL HCL 50 MG PO TABS
50.0000 mg | ORAL_TABLET | Freq: Once | ORAL | Status: AC
  Administered 2020-07-05: 50 mg via ORAL
  Filled 2020-07-05: qty 1

## 2020-07-05 MED ORDER — NAPROXEN 500 MG PO TABS
500.0000 mg | ORAL_TABLET | Freq: Once | ORAL | Status: AC
  Administered 2020-07-05: 500 mg via ORAL
  Filled 2020-07-05: qty 1

## 2020-07-05 NOTE — ED Triage Notes (Signed)
Pt to ED via [facility] for c/o fall. Pt has hx of multiple falls. Pt reports pain in right shoulder from incident. Pt A&Ox4 and in NAD at this time. Pt st he is here tonight "because of the shoulder pain".
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
## 2020-07-05 NOTE — Discharge Instructions (Signed)
Please follow up with your primary care provider as well as orthopedics.

## 2020-07-05 NOTE — ED Notes (Signed)
X-ray at bedside

## 2020-07-05 NOTE — ED Triage Notes (Signed)
First Nurse Note:  Arrives via [facility] for c/o falling today and c/o right shoulder pain.  VS wnl.

## 2020-07-06 ENCOUNTER — Emergency Department: Payer: Medicare HMO

## 2020-07-06 ENCOUNTER — Other Ambulatory Visit: Payer: Self-pay

## 2020-07-06 ENCOUNTER — Emergency Department
Admission: EM | Admit: 2020-07-06 | Discharge: 2020-07-07 | Disposition: A | Discharge status: Home / Self Care | Payer: Medicare HMO | Source: Home / Self Care | Attending: Emergency Medicine | Admitting: Emergency Medicine

## 2020-07-06 DIAGNOSIS — I6782 Cerebral ischemia: Secondary | ICD-10-CM | POA: Insufficient documentation

## 2020-07-06 DIAGNOSIS — S7001XA Contusion of right hip, initial encounter: Secondary | ICD-10-CM

## 2020-07-06 DIAGNOSIS — Z87891 Personal history of nicotine dependence: Secondary | ICD-10-CM | POA: Insufficient documentation

## 2020-07-06 DIAGNOSIS — Y929 Unspecified place or not applicable: Secondary | ICD-10-CM | POA: Insufficient documentation

## 2020-07-06 DIAGNOSIS — Z7982 Long term (current) use of aspirin: Secondary | ICD-10-CM | POA: Insufficient documentation

## 2020-07-06 DIAGNOSIS — W19XXXA Unspecified fall, initial encounter: Secondary | ICD-10-CM | POA: Insufficient documentation

## 2020-07-06 DIAGNOSIS — I10 Essential (primary) hypertension: Secondary | ICD-10-CM | POA: Insufficient documentation

## 2020-07-06 DIAGNOSIS — Y999 Unspecified external cause status: Secondary | ICD-10-CM | POA: Insufficient documentation

## 2020-07-06 DIAGNOSIS — Z79899 Other long term (current) drug therapy: Secondary | ICD-10-CM | POA: Insufficient documentation

## 2020-07-06 DIAGNOSIS — Y939 Activity, unspecified: Secondary | ICD-10-CM | POA: Insufficient documentation
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
## 2020-07-06 NOTE — ED Triage Notes (Signed)
First nurse note: Pt arrives via ems from home, pt fell again today, pt is c/o rt hip pain and head pain, pt also was recently seen in the past 5 days and diagnosed with a fx clavicle  Ems reports there was stool noted all over the house and rugs, ems reports that the towel underneath him was also soiled with stool and urine

## 2020-07-06 NOTE — ED Triage Notes (Signed)
PT to ED via EMS from home c/o fall. Tripped over a chair. PT drinks often, falls more when he is drinking. PT appears disheveled.

## 2020-07-06 NOTE — ED Notes (Signed)
Pt unable to sign E-signature due to signature pad malfunction. Pt verbalized understanding of d/c instructions and had no additional questions or concerns for this RN or provider. Pt left with d/c instructions and gathered all personal belongings from room and removed them prior to ED departure.

## 2020-07-07 ENCOUNTER — Emergency Department: Payer: Medicare HMO

## 2020-07-07 ENCOUNTER — Other Ambulatory Visit: Payer: Self-pay

## 2020-07-07 ENCOUNTER — Emergency Department
Admission: EM | Admit: 2020-07-07 | Discharge: 2020-07-13 | Disposition: A | Discharge status: Home / Self Care | Payer: Medicare HMO | Attending: Emergency Medicine | Admitting: Emergency Medicine

## 2020-07-07 ENCOUNTER — Encounter: Payer: Self-pay | Admitting: Emergency Medicine

## 2020-07-07 DIAGNOSIS — Z79899 Other long term (current) drug therapy: Secondary | ICD-10-CM | POA: Diagnosis not present

## 2020-07-07 DIAGNOSIS — R4189 Other symptoms and signs involving cognitive functions and awareness: Secondary | ICD-10-CM

## 2020-07-07 DIAGNOSIS — Z87891 Personal history of nicotine dependence: Secondary | ICD-10-CM | POA: Insufficient documentation

## 2020-07-07 DIAGNOSIS — F1020 Alcohol dependence, uncomplicated: Secondary | ICD-10-CM | POA: Insufficient documentation

## 2020-07-07 DIAGNOSIS — I10 Essential (primary) hypertension: Secondary | ICD-10-CM | POA: Diagnosis not present

## 2020-07-07 DIAGNOSIS — Z20822 Contact with and (suspected) exposure to covid-19: Secondary | ICD-10-CM | POA: Insufficient documentation

## 2020-07-07 DIAGNOSIS — R4182 Altered mental status, unspecified: Secondary | ICD-10-CM | POA: Insufficient documentation

## 2020-07-07 DIAGNOSIS — W050XXA Fall from non-moving wheelchair, initial encounter: Secondary | ICD-10-CM | POA: Insufficient documentation

## 2020-07-07 DIAGNOSIS — Y92009 Unspecified place in unspecified non-institutional (private) residence as the place of occurrence of the external cause: Secondary | ICD-10-CM | POA: Diagnosis not present

## 2020-07-07 DIAGNOSIS — R6251 Failure to thrive (child): Secondary | ICD-10-CM

## 2020-07-07 DIAGNOSIS — R296 Repeated falls: Secondary | ICD-10-CM

## 2020-07-07 DIAGNOSIS — M25511 Pain in right shoulder: Secondary | ICD-10-CM | POA: Insufficient documentation

## 2020-07-07 DIAGNOSIS — R2681 Unsteadiness on feet: Secondary | ICD-10-CM

## 2020-07-07 LAB — COMPREHENSIVE METABOLIC PANEL
ALT: 30 U/L (ref 0–44)
AST: 39 U/L (ref 15–41)
Albumin: 4.5 g/dL (ref 3.5–5.0)
Alkaline Phosphatase: 113 U/L (ref 38–126)
Anion gap: 12 (ref 5–15)
BUN: 7 mg/dL — ABNORMAL LOW (ref 8–23)
CO2: 27 mmol/L (ref 22–32)
Calcium: 9.2 mg/dL (ref 8.9–10.3)
Chloride: 96 mmol/L — ABNORMAL LOW (ref 98–111)
Creatinine, Ser: 0.47 mg/dL — ABNORMAL LOW (ref 0.61–1.24)
GFR calc Af Amer: >60 mL/min (ref >60)
GFR calc non Af Amer: >60 mL/min (ref >60)
Glucose, Bld: 100 mg/dL — ABNORMAL HIGH (ref 70–99)
Potassium: 3.9 mmol/L (ref 3.5–5.1)
Sodium: 135 mmol/L (ref 135–145)
Total Bilirubin: 1.7 mg/dL — ABNORMAL HIGH (ref 0.3–1.2)
Total Protein: 7.5 g/dL (ref 6.5–8.1)

## 2020-07-07 LAB — CBC WITH DIFFERENTIAL/PLATELET
Abs Immature Granulocytes: 0.02 10*3/uL (ref 0.00–0.07)
Basophils Absolute: 0 10*3/uL (ref 0.0–0.1)
Basophils Relative: 0 %
Eosinophils Absolute: 0.1 10*3/uL (ref 0.0–0.5)
Eosinophils Relative: 1 %
HCT: 41.5 % (ref 39.0–52.0)
Hemoglobin: 14.3 g/dL (ref 13.0–17.0)
Immature Granulocytes: 0 %
Lymphocytes Relative: 33 %
Lymphs Abs: 2.7 10*3/uL (ref 0.7–4.0)
MCH: 30.4 pg (ref 26.0–34.0)
MCHC: 34.5 g/dL (ref 30.0–36.0)
MCV: 88.1 fL (ref 80.0–100.0)
Monocytes Absolute: 0.8 10*3/uL (ref 0.1–1.0)
Monocytes Relative: 10 %
Neutro Abs: 4.6 10*3/uL (ref 1.7–7.7)
Neutrophils Relative %: 56 %
Platelets: 127 10*3/uL — ABNORMAL LOW (ref 150–400)
RBC: 4.71 MIL/uL (ref 4.22–5.81)
RDW: 13.6 % (ref 11.5–15.5)
WBC: 8.2 10*3/uL (ref 4.0–10.5)
nRBC: 0 % (ref 0.0–0.2)

## 2020-07-07 LAB — LIPASE, BLOOD: Lipase: 32 U/L (ref 11–51)

## 2020-07-07 LAB — MAGNESIUM: Magnesium: 1.9 mg/dL (ref 1.7–2.4)

## 2020-07-07 LAB — ETHANOL: Alcohol, Ethyl (B): <10 mg/dL (ref <10)

## 2020-07-07 MED ORDER — LACTATED RINGERS IV BOLUS
1000.0000 mL | Freq: Once | INTRAVENOUS | Status: AC
  Administered 2020-07-07: 1000 mL via INTRAVENOUS

## 2020-07-07 MED ORDER — THIAMINE HCL 100 MG PO TABS
100.0000 mg | ORAL_TABLET | Freq: Once | ORAL | Status: AC
  Administered 2020-07-07: 100 mg via ORAL
  Filled 2020-07-07: qty 1

## 2020-07-07 MED ORDER — LORAZEPAM 1 MG PO TABS
1.0000 mg | ORAL_TABLET | Freq: Once | ORAL | Status: AC
  Administered 2020-07-07: 1 mg via ORAL
  Filled 2020-07-07: qty 1

## 2020-07-07 MED ORDER — THIAMINE HCL 100 MG PO TABS
100.0000 mg | ORAL_TABLET | Freq: Every day | ORAL | Status: DC
  Administered 2020-07-07: 100 mg via ORAL
  Filled 2020-07-07: qty 1

## 2020-07-07 MED ORDER — LORAZEPAM 2 MG PO TABS
2.0000 mg | ORAL_TABLET | Freq: Once | ORAL | Status: AC
  Administered 2020-07-07: 2 mg via ORAL
  Filled 2020-07-07: qty 1

## 2020-07-07 MED ORDER — LORAZEPAM 2 MG PO TABS
0.0000 mg | ORAL_TABLET | Freq: Four times a day (QID) | ORAL | Status: DC
  Administered 2020-07-07 (×2): 1 mg via ORAL
  Filled 2020-07-07 (×2): qty 1

## 2020-07-07 MED ORDER — CHLORDIAZEPOXIDE HCL 25 MG PO CAPS
25.0000 mg | ORAL_CAPSULE | Freq: Once | ORAL | Status: AC
  Administered 2020-07-07: 25 mg via ORAL
  Filled 2020-07-07: qty 1

## 2020-07-07 NOTE — ED Triage Notes (Addendum)
Pt was recently discharged and fell getting out of cab. Denies drinking any more alcohol since leaving. Pt is currently oriented to person, place and year. Pt reports last drink couple days ago

## 2020-07-07 NOTE — ED Notes (Signed)
Pt alert, calm. Eating snacks
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
## 2020-07-07 NOTE — ED Notes (Signed)
Pt given breakfast tray

## 2020-07-07 NOTE — ED Notes (Signed)
Called and spoke to [place], pt's sister, gave updates

## 2020-07-07 NOTE — ED Notes (Signed)
Pt discharged home with cab voucher. VS stable.  Discharge instructions reviewed with patient. Pt stated he was unable to sign for his discharge.  Pt given cab voucher.

## 2020-07-07 NOTE — ED Notes (Signed)
EDP made aware of pt's VS.  2 mg PO Ativan given as ordered.

## 2020-07-07 NOTE — ED Notes (Signed)
Pt changed of soiled clothes by Wannetta Sender RN

## 2020-07-07 NOTE — ED Notes (Signed)
Pt given cab voucher.

## 2020-07-07 NOTE — ED Notes (Signed)
Brief soiled, changed. Given urinal

## 2020-07-07 NOTE — Discharge Instructions (Signed)
Drink alcohol only in moderation.  Use your walker to help you balance as you walk.  Return to the ER for worsening symptoms, persistent vomiting, difficulty breathing or other concerns.

## 2020-07-07 NOTE — ED Notes (Addendum)
RN called Ladona Mow.

## 2020-07-07 NOTE — ED Notes (Signed)
Pt given snacks and drink
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
## 2020-07-08 LAB — URINALYSIS, COMPLETE (UACMP) WITH MICROSCOPIC
Bilirubin Urine: NEGATIVE
Glucose, UA: NEGATIVE mg/dL
Hgb urine dipstick: NEGATIVE
Ketones, ur: 20 mg/dL — AB
Leukocytes,Ua: NEGATIVE
Nitrite: NEGATIVE
Protein, ur: 30 mg/dL — AB
Specific Gravity, Urine: 1.02 (ref 1.005–1.030)
pH: 7 (ref 5.0–8.0)

## 2020-07-08 LAB — SARS CORONAVIRUS 2 BY RT PCR (HOSPITAL ORDER, PERFORMED IN ~~LOC~~ HOSPITAL LAB): SARS Coronavirus 2: NEGATIVE

## 2020-07-08 MED ORDER — LORAZEPAM 2 MG PO TABS
0.0000 mg | ORAL_TABLET | Freq: Two times a day (BID) | ORAL | Status: AC
  Administered 2020-07-11: 2 mg via ORAL
  Filled 2020-07-08: qty 1

## 2020-07-08 MED ORDER — LACTULOSE 10 GM/15ML PO SOLN
10.0000 g | Freq: Every day | ORAL | Status: DC
  Administered 2020-07-08 – 2020-07-13 (×6): 10 g via ORAL
  Filled 2020-07-08 (×6): qty 30

## 2020-07-08 MED ORDER — LORAZEPAM 2 MG PO TABS
0.0000 mg | ORAL_TABLET | Freq: Four times a day (QID) | ORAL | Status: AC
  Administered 2020-07-08 (×3): 2 mg via ORAL
  Administered 2020-07-09 (×2): 1 mg via ORAL
  Filled 2020-07-08 (×5): qty 1

## 2020-07-08 MED ORDER — FOLIC ACID 1 MG PO TABS
1.0000 mg | ORAL_TABLET | Freq: Every day | ORAL | Status: DC
  Administered 2020-07-08 – 2020-07-13 (×6): 1 mg via ORAL
  Filled 2020-07-08 (×6): qty 1

## 2020-07-08 MED ORDER — DOCUSATE SODIUM 100 MG PO CAPS
100.0000 mg | ORAL_CAPSULE | Freq: Two times a day (BID) | ORAL | Status: DC
  Administered 2020-07-08 – 2020-07-13 (×11): 100 mg via ORAL
  Filled 2020-07-08 (×11): qty 1

## 2020-07-08 MED ORDER — ATENOLOL 25 MG PO TABS
25.0000 mg | ORAL_TABLET | Freq: Two times a day (BID) | ORAL | Status: DC
  Administered 2020-07-08 – 2020-07-13 (×10): 25 mg via ORAL
  Filled 2020-07-08 (×11): qty 1

## 2020-07-08 MED ORDER — ACETAMINOPHEN 500 MG PO TABS: 500.0000 mg | ORAL_TABLET | Freq: Four times a day (QID) | ORAL | Status: DC | PRN

## 2020-07-08 MED ORDER — THIAMINE HCL 100 MG PO TABS
100.0000 mg | ORAL_TABLET | Freq: Every day | ORAL | Status: DC
  Administered 2020-07-08 – 2020-07-12 (×5): 100 mg via ORAL
  Filled 2020-07-08 (×5): qty 1

## 2020-07-08 MED ORDER — ASCORBIC ACID 500 MG PO TABS
1000.0000 mg | ORAL_TABLET | Freq: Every day | ORAL | Status: DC
  Administered 2020-07-08 – 2020-07-13 (×6): 1000 mg via ORAL
  Filled 2020-07-08 (×5): qty 2

## 2020-07-08 MED ORDER — LORAZEPAM 2 MG/ML IJ SOLN: 0.0000 mg | Freq: Four times a day (QID) | INTRAMUSCULAR | Status: AC

## 2020-07-08 MED ORDER — ADULT MULTIVITAMIN W/MINERALS CH
1.0000 | ORAL_TABLET | Freq: Every day | ORAL | Status: DC
  Administered 2020-07-08 – 2020-07-13 (×6): 1 via ORAL
  Filled 2020-07-08 (×6): qty 1

## 2020-07-08 MED ORDER — PAROXETINE HCL 30 MG PO TABS
30.0000 mg | ORAL_TABLET | Freq: Every day | ORAL | Status: DC
  Administered 2020-07-08 – 2020-07-13 (×6): 30 mg via ORAL
  Filled 2020-07-08 (×6): qty 1

## 2020-07-08 MED ORDER — BACLOFEN 10 MG PO TABS
10.0000 mg | ORAL_TABLET | Freq: Three times a day (TID) | ORAL | Status: DC
  Administered 2020-07-08 – 2020-07-13 (×14): 10 mg via ORAL
  Filled 2020-07-08 (×19): qty 1

## 2020-07-08 MED ORDER — ASPIRIN EC 81 MG PO TBEC
81.0000 mg | DELAYED_RELEASE_TABLET | Freq: Every day | ORAL | Status: DC
  Administered 2020-07-08 – 2020-07-13 (×6): 81 mg via ORAL
  Filled 2020-07-08 (×6): qty 1

## 2020-07-08 MED ORDER — LORAZEPAM 2 MG/ML IJ SOLN: 0.0000 mg | Freq: Two times a day (BID) | INTRAMUSCULAR | Status: AC

## 2020-07-08 MED ORDER — PANTOPRAZOLE SODIUM 20 MG PO TBEC
20.0000 mg | DELAYED_RELEASE_TABLET | Freq: Every day | ORAL | Status: DC
  Administered 2020-07-08 – 2020-07-13 (×6): 20 mg via ORAL
  Filled 2020-07-08 (×6): qty 1

## 2020-07-08 NOTE — ED Notes (Signed)
Report to include Situation, Background, Assessment, and Recommendations received from [facility]. Patient alert and oriented, warm and dry, in no acute distress. Patient made aware of Q15 minute rounds and Engineer, drilling presence for their safety. Patient instructed to come to me with needs or concerns.

## 2020-07-08 NOTE — ED Notes (Signed)
Offered Patient snack bag. Patient went to sleep

## 2020-07-08 NOTE — ED Notes (Signed)
Discharge papers and follow up instructions reviewed with pt.

## 2020-07-08 NOTE — ED Notes (Signed)
Meal tray provided.

## 2020-07-08 NOTE — ED Notes (Signed)
Hourly rounding reveals patient sleeping in hall bed. No complaints, stable, in no acute distress. Q15 minute rounds and monitoring via Engineer, drilling to continue.

## 2020-07-08 NOTE — TOC Initial Note (Signed)
Transition of Care (TOC) - Initial/Assessment Note    Patient Details  Name: Douglas Peterson. MRN: [id number] Date of Birth: [birth year]  Transition of Care [facility]) CM/SW Contact:    Maebelle Munroe, RN Phone Number: 07/08/2020, 6:05 PM  Clinical Narrative:                 [facility] team Case Manager spoke to pt about SNF placement. Pt agrees to placement. He voices a preference to go to [facility]. Provided list of SNFs in his area. Case Manager will continue discharge plan.  Expected Discharge Plan: Skilled Nursing Facility Barriers to Discharge: No Barriers Identified   Patient Goals and CMS Choice Patient states their goals for this hospitalization and ongoing recovery are:: Agreed to go to SNF for rehab CMS Medicare.gov Compare Post Acute Care list provided to:: Patient Choice offered to / list presented to : Patient  Expected Discharge Plan and Services Expected Discharge Plan: [place] In-house Referral: Clinical Social Work Discharge Planning Services: CM Consult   Living arrangements for the past 2 months: [place]                                      Prior Living Arrangements/Services Living arrangements for the past 2 months: Single Family Home Lives with:: Self Patient language and need for interpreter reviewed:: Yes        Need for Family Participation in Patient Care: No (Comment) Care giver support system in place?: No (comment)   Criminal Activity/Legal Involvement Pertinent to Current Situation/Hospitalization: No - Comment as needed (No criminal activity noted.)  Activities of Daily Living      Permission Sought/Granted Permission sought to share information with : Case Manager Permission granted to share information with : Yes, Verbal Permission Granted              Emotional Assessment Appearance:: Appears older than stated age, Disheveled Attitude/Demeanor/Rapport: Lethargic, Engaged Affect (typically  observed): Accepting, Calm Orientation: : Oriented to Self, Oriented to Situation Alcohol / Substance Use: Alcohol Use Psych Involvement: No (comment)  Admission diagnosis:  waekness Patient Active Problem List   Diagnosis Date Noted  . Closed fracture of distal clavicle_Right 06/05/2020  . GERD (gastroesophageal reflux disease) 06/05/2020  . Alcohol use disorder, severe, in early remission, dependence ([place]) 05/28/2020  . Acquired thrombophilia ([place])   . Elevated LFTs 04/06/2020  . Fall 04/06/2020  . Rhabdomyolysis 04/06/2020  . Elevated lactic acid level 04/06/2020  . Sepsis ([place]) 03/17/2020  . Constipation 03/17/2020  . Paroxysmal A-fib ([place]) 03/17/2020  . Severe protein-calorie malnutrition ([place]) 02/25/2020  . Left inguinal hernia 01/31/2020  . Encounter for competency evaluation   . Depression 01/17/2020  . Esophagitis   . Nausea without vomiting   . Weakness   . Tremor   . Pancytopenia ([place])   . Hypomagnesemia   . Alcoholic cirrhosis of liver without ascites ([place])   . Thrombocytopenia ([place])   . Non-traumatic rhabdomyolysis   . Alcohol withdrawal delirium ([place]) 12/08/2019  . ETOH abuse   . Alcoholic intoxication with complication ([place])   . 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 Hypokalemia 09/27/2019  . Dysphagia 09/27/2019  . Alcohol withdrawal delirium, acute, hyperactive ([place]) 09/21/2019  . Pressure injury of ankle, stage 1 08/15/2019  . Goals of care, counseling/discussion   . Palliative care by specialist   . Alcohol withdrawal (  [place]) 03/20/2019  . Low back pain 11/15/2018  . Acute metabolic encephalopathy 93/73/4287  . Delirium tremens ([place]) 03/08/2018  . Malnutrition of moderate degree 11/24/2017  . HTN (hypertension) 09/16/2017  . Hypothermia 12/17/2016  . Alcohol use   . Diarrhea   . Hyponatremia 07/09/2015   PCP:  [name], MD Pharmacy:   [facility] [address], [place] - [place] [address] [place] [address] Phone: [phone] Fax: [phone]     Social Determinants of Health (SDOH) Interventions    Readmission Risk Interventions Readmission Risk Prevention Plan 05/19/2020 12/13/2019 12/12/2019  Transportation Screening Complete Complete Complete  PCP or Specialist Appt within 3-5 Days - (No Data) -  [place] or [place] - (No Data) -  Palliative Care Screening - Complete Complete  Medication Review (RN Care Manager) Complete Complete Complete  PCP or Specialist appointment within 3-5 days of discharge Complete - -  [place] or Home Care Consult Complete - -  SW Recovery Care/Counseling Consult Complete - -  Palliative Care Screening Not Applicable - -  [place] Not Applicable - -  Some recent data might be hidden

## 2020-07-08 NOTE — ED Notes (Addendum)
Hourly rounding reveals patient awake in hall bed. No complaints, stable, in no acute distress. Q15 minute rounds and monitoring via Engineer, drilling to continue.

## 2020-07-08 NOTE — ED Notes (Signed)
EMS here to transport pt home and refusing to transport pt as they say he cannot stand and walk. With much encouragement for EMS and this RN pt agrees he needs to get some help with taking care of himself. Dr Archie Balboa informed and we will keep the pt here to see social worker.

## 2020-07-08 NOTE — ED Notes (Signed)
Report to include Situation, Background, Assessment, and Recommendations received from [facility]. Patient alert and oriented, warm and dry, in no acute distress. Patient denies SI, HI, AVH and pain. Patient made aware of Q15 minute rounds and Engineer, drilling presence for their safety. Patient instructed to come to me with needs or concerns.

## 2020-07-09 NOTE — ED Notes (Signed)
Hourly rounding reveals patient sleeping in hall bed. No complaints, stable, in no acute distress. Q15 minute rounds and monitoring via Engineer, drilling to continue.

## 2020-07-09 NOTE — ED Notes (Signed)
Hourly rounding reveals patient sleeping in room. No complaints, stable, in no acute distress. Q15 minute rounds and monitoring via Engineer, drilling to continue.

## 2020-07-09 NOTE — ED Notes (Signed)
Hourly rounding reveals patient sleeping in hallway bed. No complaints, stable, in no acute distress. Q15 minute rounds and monitoring via Security to continue.

## 2020-07-09 NOTE — ED Provider Notes (Signed)
Emergency Medicine Observation Re-evaluation Note  Douglas Peterson. is a 69 y.o. male, seen on rounds today.  Pt initially presented to the ED for complaints of Fall Currently, the patient is resting.  Physical Exam  BP (!) 151/84 (BP Location: Left Arm)   Pulse 85   Temp 98.6 F (37 C) (Oral)   Resp 18   Ht 1.6 m (5' 3" )   Wt 63.5 kg   SpO2 98%   BMI 24.80 kg/m  Physical Exam Gen:  No acute distress Resp:  Breathing easily and comfortably, no accessory muscle usage Neuro: Intermittently moving all four extremities, no gross focal neuro deficits Psych:  Resting currently  ED Course / MDM  EKG:  Clinical Course as of Jul 09 726  Sat Jul 07, 2020  6244 Imaging reviewed with known right distal clavicle fracture, seen on imaging from 2 days ago.  No evidence of acute fractures that are new.   [DS]  2215 Reassessed.  Patient reports feeling cold and requesting additional blankets.  No needs   [DS]    Clinical Course User Index [DS] Vladimir Crofts, MD   I have reviewed the labs performed to date as well as medications administered while in observation.  Recent changes in the last 24 hours include no significant clinical changes.  Plan  Current plan is for social work assistance for placement. [name] not under full IVC at this time.   Hinda Kehr, MD 07/09/20 [phone]

## 2020-07-09 NOTE — ED Notes (Signed)
Patient pulled up in bed and set up with dinner tray

## 2020-07-09 NOTE — NC FL2 (Signed)
[place] LEVEL OF CARE SCREENING TOOL     IDENTIFICATION  Patient Name: Douglas Peterson. Birthdate: Mar 25, 1951 Sex: male Admission Date (Current Location): 07/07/2020  [place] and [place] Number:  Engineering geologist and Address:  [facility], [address] [place], [place] [address]      Provider Number: [id number]  Attending Physician Name and Address:  No att. providers found  Relative Name and Phone Number:       Current Level of Care: Hospital Recommended Level of Care: [place] Prior Approval Number:    Date Approved/Denied:   PASRR Number: [id number]  Discharge Plan: SNF    Current Diagnoses: Patient Active Problem List   Diagnosis Date Noted  . Closed fracture of distal clavicle_Right 06/05/2020  . GERD (gastroesophageal reflux disease) 06/05/2020  . Alcohol use disorder, severe, in early remission, dependence ([place]) 05/28/2020  . Acquired thrombophilia ([place])   . Elevated LFTs 04/06/2020  . Fall 04/06/2020  . Rhabdomyolysis 04/06/2020  . Elevated lactic acid level 04/06/2020  . Sepsis ([place]) 03/17/2020  . Constipation 03/17/2020  . Paroxysmal A-fib ([place]) 03/17/2020  . Severe protein-calorie malnutrition ([place]) 02/25/2020  . Left inguinal hernia 01/31/2020  . Encounter for competency evaluation   . Depression 01/17/2020  . Esophagitis   . Nausea without vomiting   . Weakness   . Tremor   . Pancytopenia ([place])   . Hypomagnesemia   . Alcoholic cirrhosis of liver without ascites ([place])   . Thrombocytopenia ([place])   . Non-traumatic rhabdomyolysis   . Alcohol withdrawal delirium ([place]) 12/08/2019  . ETOH abuse   . Alcoholic intoxication with complication ([place])   . 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 Hypokalemia 09/27/2019  . Dysphagia 09/27/2019  . Alcohol withdrawal delirium, acute, hyperactive ([place]) 09/21/2019  . Pressure injury of ankle, stage 1 08/15/2019   . Goals of care, counseling/discussion   . Palliative care by specialist   . Alcohol withdrawal ([place]) 03/20/2019  . Low back pain 11/15/2018  . Acute metabolic encephalopathy 98/33/8250  . Delirium tremens ([place]) 03/08/2018  . Malnutrition of moderate degree 11/24/2017  . HTN (hypertension) 09/16/2017  . Hypothermia 12/17/2016  . Alcohol use   . Diarrhea   . Hyponatremia 07/09/2015    Orientation RESPIRATION BLADDER Height & Weight     Self, Time, Situation, Place  Normal Incontinent Weight: 63.5 kg Height:  5' 3"  (160 cm)  BEHAVIORAL SYMPTOMS/MOOD NEUROLOGICAL BOWEL NUTRITION STATUS      Incontinent Diet  AMBULATORY STATUS COMMUNICATION OF NEEDS Skin   Extensive Assist Verbally Bruising                       Personal Care Assistance Level of Assistance  Bathing, Feeding, Dressing Bathing Assistance: Limited assistance Feeding assistance: Limited assistance Dressing Assistance: Limited assistance     Functional Limitations Info             SPECIAL CARE FACTORS FREQUENCY  PT (By licensed PT), OT (By licensed OT)     PT Frequency: min 5xweek OT Frequency: min 5xweek            Contractures      Additional Factors Info  Code Status Code Status Info: Full Allergies Info: NKDA           Current Medications (07/09/2020):  This is the current hospital active medication list Current Facility-Administered Medications  Medication Dose Route Frequency Provider Last Rate Last Admin  .  acetaminophen (TYLENOL) tablet 500 mg  500 mg Oral Q6H PRN Lucrezia Starch, MD      . ascorbic acid (VITAMIN C) tablet 1,000 mg  1,000 mg Oral Daily Lucrezia Starch, MD   1,000 mg at 07/08/20 1105  . aspirin EC tablet 81 mg  81 mg Oral Daily Lucrezia Starch, MD   81 mg at 07/08/20 1104  . atenolol (TENORMIN) tablet 25 mg  25 mg Oral BID Lucrezia Starch, MD   25 mg at 07/08/20 2113  . baclofen (LIORESAL) tablet 10 mg  10 mg Oral TID Lucrezia Starch, MD   10 mg at 07/08/20  2113  . docusate sodium (COLACE) capsule 100 mg  100 mg Oral BID Lucrezia Starch, MD   100 mg at 07/08/20 2113  . folic acid (FOLVITE) tablet 1 mg  1 mg Oral Daily Lucrezia Starch, MD   1 mg at 07/08/20 1105  . lactulose (CHRONULAC) 10 GM/15ML solution 10 g  10 g Oral Daily Lucrezia Starch, MD   10 g at 07/08/20 1106  . LORazepam (ATIVAN) injection 0-4 mg  0-4 mg Intravenous Q6H Lucrezia Starch, MD       Or  . LORazepam (ATIVAN) tablet 0-4 mg  0-4 mg Oral Q6H Lucrezia Starch, MD   2 mg at 07/08/20 2248  . [START ON 07/10/2020] LORazepam (ATIVAN) injection 0-4 mg  0-4 mg Intravenous Q12H Lucrezia Starch, MD       Or  . Derrill Memo ON 07/10/2020] LORazepam (ATIVAN) tablet 0-4 mg  0-4 mg Oral Q12H Lucrezia Starch, MD      . multivitamin with minerals tablet 1 tablet  1 tablet Oral Daily Lucrezia Starch, MD   1 tablet at 07/08/20 1104  . pantoprazole (PROTONIX) EC tablet 20 mg  20 mg Oral Daily Lucrezia Starch, MD   20 mg at 07/08/20 1105  . PARoxetine (PAXIL) tablet 30 mg  30 mg Oral Daily Lucrezia Starch, MD   30 mg at 07/08/20 1104  . thiamine tablet 100 mg  100 mg Oral Daily Lucrezia Starch, MD   100 mg at 07/08/20 1105   Current Outpatient Medications  Medication Sig Dispense Refill  . acetaminophen (TYLENOL) 500 MG tablet Take 500 mg by mouth every 6 (six) hours as needed.    . Ascorbic Acid (VITAMIN C) 1000 MG tablet Take 1,000 mg by mouth daily.    [name] aspirin EC 81 MG tablet Take 81 mg by mouth daily. Swallow whole.    [name] atenolol (TENORMIN) 25 MG tablet Take 25 mg by mouth 2 (two) times daily.     . Baclofen 5 MG TABS Take 10 mg by mouth 3 (three) times daily.    . calcium-vitamin D (OSCAL WITH D) 500-200 MG-UNIT tablet Take 1 tablet by mouth daily with breakfast.    . Cholecalciferol 125 MCG (5000 UT) TABS Take 5,000 Units by mouth daily.    [name] docusate sodium (COLACE) 100 MG capsule Take 1 capsule (100 mg total) by mouth 2 (two) times daily. 60 capsule 0  . doxepin (SINEQUAN) 10 MG  capsule Take 10 mg by mouth at bedtime as needed.    . feeding supplement, ENSURE ENLIVE, (ENSURE ENLIVE) LIQD Take 237 mLs by mouth 2 (two) times daily between meals. 169 mL 12  . folic acid (FOLVITE) 1 MG tablet Take 1 tablet (1 mg total) by mouth daily. 30 tablet 0  . Multiple Vitamin (  MULTIVITAMIN WITH MINERALS) TABS tablet Take 1 tablet by mouth daily. 30 tablet 0  . omeprazole (PRILOSEC OTC) 20 MG tablet Take 1 tablet (20 mg total) by mouth daily. 28 tablet 1  . ondansetron (ZOFRAN) 4 MG tablet Take 4 mg by mouth every 8 (eight) hours as needed for nausea or vomiting.    [name] PARoxetine (PAXIL) 30 MG tablet Take 1 tablet (30 mg total) by mouth daily. Take 1 tablet (30 mg total) by mouth once daily For anxiety 30 tablet 0  . thiamine 100 MG tablet Take 1 tablet (100 mg total) by mouth daily. 30 tablet 0  . lactulose (CHRONULAC) 10 GM/15ML solution Take 15 mLs (10 g total) by mouth daily. 946 mL 0     Discharge Medications: Please see discharge summary for a list of discharge medications.  Relevant Imaging Results:  Relevant Lab Results:   Additional Information SS# [id number]  Anselm Pancoast, RN

## 2020-07-09 NOTE — ED Notes (Signed)
Patients brief changed by [facility] Jan

## 2020-07-09 NOTE — Evaluation (Signed)
Physical Therapy Evaluation Patient Details Name: Douglas Peterson. MRN: [id number] DOB: 26-Oct-1951 Today's Date: 07/09/2020   History of Present Illness  Pt is a 69 y.o. malewho presents to the ED for evaluation of fall with diagnosis of R distal clavical fracture.  Pt to be managed non-operatively with conservative management.  PMH includes: Alcohol abuse, A-fib, hyponatremia, HTN, TBI with R sided weakness.    Clinical Impression  Pt was pleasant and motivated to participate during the session but was quite limited with all functional tasks.  Pt required extensive physical assistance with both bed mobility and transfers and was only able to take 2-3 very small shuffling steps at the EOB with extensive assist to prevent posterior LOB.  Pt is at an extremely high risk for falls and would not be safe to return to his prior living situation at this time.  Pt will benefit from PT services in a SNF setting upon discharge to safely address deficits listed in patient problem list for decreased caregiver assistance and eventual return to PLOF.      Follow Up Recommendations SNF    Equipment Recommendations  None recommended by PT    Recommendations for Other Services       Precautions / Restrictions Precautions Precautions: Fall Precaution Comments: RUE sling Restrictions Weight Bearing Restrictions: Yes RUE Weight Bearing: Non weight bearing Other Position/Activity Restrictions: R clavicle fx      Mobility  Bed Mobility Overal bed mobility: Needs Assistance Bed Mobility: Supine to Sit;Sit to Supine     Supine to sit: Mod assist;Max assist Sit to supine: Mod assist;Max assist   General bed mobility comments: Mod to max A for BLE and trunk control  Transfers Overall transfer level: Needs assistance Equipment used: Straight cane Transfers: Sit to/from Stand Sit to Stand: Mod assist         General transfer comment: Mod A to stand and to prevent posterior LOB while in  standing  Ambulation/Gait Ambulation/Gait assistance: Mod assist;Max assist Gait Distance (Feet): 1 Feet Assistive device: Straight cane Gait Pattern/deviations: Step-to pattern;Shuffle     General Gait Details: Pt only able to shuffle BLE's < 1 foot with heavy assist to prevent posterior LOB  Stairs            Wheelchair Mobility    Modified Rankin (Stroke Patients Only)       Balance Overall balance assessment: Needs assistance Sitting-balance support: No upper extremity supported;Feet unsupported Sitting balance-Leahy Scale: Fair     Standing balance support: During functional activity;Single extremity supported Standing balance-Leahy Scale: Poor Standing balance comment: Pt required mod to max A to prevent LOB in standing/taking steps                             Pertinent Vitals/Pain Pain Assessment: 0-10 Pain Score: 3  Pain Location: R hip Pain Descriptors / Indicators: Aching;Sore Pain Intervention(s): Premedicated before session;Monitored during session    [place] expects to be discharged to:: Private residence Living Arrangements: Alone Available Help at Discharge: Family;Available PRN/intermittently Type of Home: House Home Access: Stairs to enter Entrance Stairs-Rails: Right;Left;Can reach both Entrance Stairs-Number of Steps: 3 with no rails (from garage) or 5 with bilateral rails (from front entrance) Home Layout: One level Home Equipment: Cane - single point;Cane - quad;Walker - 2 wheels;Tub bench;Grab bars - tub/shower;Shower seat;Grab bars - toilet      Prior Function Level of Independence: Independent with assistive device(s)  Comments: Modified independent ambulating with SPC limited community distances; h/o falls; (+) driving; modified independent with ADL's     Hand Dominance        Extremity/Trunk Assessment   Upper Extremity Assessment Upper Extremity Assessment: Generalized weakness RUE  Deficits / Details: Chronic RUE weakness with tone/flexion at hand/wrist. Currently RUE immobilized and to be NWB secondary to clavicular fx.    Lower Extremity Assessment Lower Extremity Assessment: Generalized weakness RLE Deficits / Details: Chronic RLE weakness from TBI       Communication   Communication: No difficulties  Cognition Arousal/Alertness: Awake/alert Behavior During Therapy: WFL for tasks assessed/performed Overall Cognitive Status: Within Functional Limits for tasks assessed                                        General Comments      Exercises     Assessment/Plan    PT Assessment Patient needs continued PT services  PT Problem List Decreased strength;Decreased activity tolerance;Decreased balance;Decreased mobility       PT Treatment Interventions DME instruction;Gait training;Stair training;Functional mobility training;Therapeutic activities;Therapeutic exercise;Balance training;Patient/family education    PT Goals (Current goals can be found in the Care Plan section)  Acute Rehab PT Goals Patient Stated Goal: To get stronger PT Goal Formulation: With patient Time For Goal Achievement: 07/22/20 Potential to Achieve Goals: Fair    Frequency Min 2X/week   Barriers to discharge Decreased caregiver support;Inaccessible home environment      Co-evaluation               AM-PAC PT "6 Clicks" Mobility  Outcome Measure Help needed turning from your back to your side while in a flat bed without using bedrails?: A Lot Help needed moving from lying on your back to sitting on the side of a flat bed without using bedrails?: A Lot Help needed moving to and from a bed to a chair (including a wheelchair)?: A Lot Help needed standing up from a chair using your arms (e.g., wheelchair or bedside chair)?: A Lot Help needed to walk in hospital room?: Total Help needed climbing 3-5 steps with a railing? : Total 6 Click Score: 10    End of  Session Equipment Utilized During Treatment: Gait belt Activity Tolerance: Patient tolerated treatment well;No increased pain;Patient limited by fatigue Patient left: in bed;with nursing/sitter in room;Other (comment) (Pt left in ER hallway bed with nurse and CNA assisting with hygiene) Nurse Communication: Mobility status, need for RUE sling PT Visit Diagnosis: Unsteadiness on feet (R26.81);Other abnormalities of gait and mobility (R26.89);Repeated falls (R29.6);Muscle weakness (generalized) (M62.81);History of falling (Z91.81);Difficulty in walking, not elsewhere classified (R26.2)    Time: 5072-2575 PT Time Calculation (min) (ACUTE ONLY): 17 min   Charges:   PT Evaluation $PT Eval Moderate Complexity: 1 Mod          D. Scott  PT, DPT 07/09/20, 12:02 PM

## 2020-07-09 NOTE — TOC Progression Note (Addendum)
Transition of Care (TOC) - Progression Note    Patient Details  Name: Douglas Peterson. MRN: [id number] Date of Birth: 01/31/1951  Transition of Care [facility]) CM/SW Contact  Anselm Pancoast, RN Phone Number: 07/09/2020, 10:13 AM  Clinical Narrative:    Spoke with patient at bedside and patient requesting placement at SNF. States he has not drank in over a week. PASRR completed [id number]. Will submit Fl2 and send out bed requests.  LVMM for [name], APS-DSS of patient returning to ED.   Will monitor for updated PT eval and recommendation. Will begin insurance authorization (currently waived) once eval completed.   Expected Discharge Plan: Skilled Nursing Facility Barriers to Discharge: No Barriers Identified  Expected Discharge Plan and Services Expected Discharge Plan: [place] In-house Referral: Clinical Social Work Discharge Planning Services: CM Consult   Living arrangements for the past 2 months: Single Family Home                                       Social Determinants of Health (SDOH) Interventions    Readmission Risk Interventions Readmission Risk Prevention Plan 05/19/2020 12/13/2019 12/12/2019  Transportation Screening Complete Complete Complete  PCP or Specialist Appt within 3-5 Days - (No Data) -  [place] or [place] - (No Data) -  Palliative Care Screening - Complete Complete  Medication Review (RN Care Manager) Complete Complete Complete  PCP or Specialist appointment within 3-5 days of discharge Complete - -  [place] or Home Care Consult Complete - -  SW Recovery Care/Counseling Consult Complete - -  Palliative Care Screening Not Applicable - -  [place] Not Applicable - -  Some recent data might be hidden

## 2020-07-09 NOTE — ED Notes (Addendum)
:   PT was sleeping but had urinated all over himself and the bed. Changed patient and his bead. Applied barrier cream to bottom as skin breakdown was occurring. Nurse saw the skin breakdown. PT is resting.

## 2020-07-10 NOTE — ED Notes (Signed)
VOL/  PENDING  PLACEMENT

## 2020-07-10 NOTE — ED Notes (Signed)
ENVIRONMENTAL ASSESSMENT Potentially harmful objects out of patient reach: Yes.   Personal belongings secured: Yes.   Patient dressed in hospital provided attire only: Yes.   Plastic bags out of patient reach: Yes.   Patient care equipment (cords, cables, call bells, lines, and drains) shortened, removed, or accounted for: Yes.   Equipment and supplies removed from bottom of stretcher: Yes.   Potentially toxic materials out of patient reach: Yes.   Sharps container removed or out of patient reach: Yes.      Pt. Alert and oriented, warm and dry, in no distress. Pt. Denies SI, HI, and AVH. Pt. Encouraged to let nursing staff know of any concerns or needs.

## 2020-07-10 NOTE — ED Provider Notes (Signed)
[name] refusing the placement situation we offered him. He is demanding to go home. He is competent. We will discharge him.    Nena Polio, MD 07/10/20 [phone]

## 2020-07-10 NOTE — ED Notes (Signed)
Pt cleansed of urine. New brief, sheets applied. Pt given bed bath with wipes. New blankets given.

## 2020-07-10 NOTE — ED Notes (Signed)
Pt remains dry at this time. Pt given lunch tray.

## 2020-07-10 NOTE — ED Notes (Signed)
Vol patient said is too weak to go home

## 2020-07-10 NOTE — ED Notes (Signed)
Patient resting with eyes closed. Respirations even and non labored. Will continue to monitor.  

## 2020-07-10 NOTE — TOC Progression Note (Signed)
Transition of Care (TOC) - Progression Note    Patient Details  Name: Douglas Peterson. MRN: [id number] Date of Birth: 03-08-1951  Transition [facility]) CM/SW Contact  Anselm Pancoast, RN Phone Number: 07/10/2020, 9:29 AM  Clinical Narrative:    Spoke to [name] with APS and updated on current bed search. Roni planning visit with patient today in ED.    Expected Discharge Plan: Skilled Nursing Facility Barriers to Discharge: No Barriers Identified  Expected Discharge Plan and Services Expected Discharge Plan: [place] In-house Referral: Clinical Social Work Discharge Planning Services: CM Consult   Living arrangements for the past 2 months: Single Family Home                                       Social Determinants of Health (SDOH) Interventions    Readmission Risk Interventions Readmission Risk Prevention Plan 05/19/2020 12/13/2019 12/12/2019  Transportation Screening Complete Complete Complete  PCP or Specialist Appt within 3-5 Days - (No Data) -  HRI or [place] - (No Data) -  Palliative Care Screening - Complete Complete  Medication Review (RN Care Manager) Complete Complete Complete  PCP or Specialist appointment within 3-5 days of discharge Complete - -  [place] or Home Care Consult Complete - -  SW Recovery Care/Counseling Consult Complete - -  Palliative Care Screening Not Applicable - -  [place] Not Applicable - -  Some recent data might be hidden

## 2020-07-10 NOTE — ED Notes (Signed)
Per Social work, pt AOx4 and able to make decisions. EMS called to take pt home.
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
## 2020-07-10 NOTE — ED Notes (Signed)
Called [facility] for transport to home  [phone]
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
## 2020-07-11 NOTE — ED Notes (Signed)
VOL  PENDING  PLACEMENT

## 2020-07-11 NOTE — Progress Notes (Signed)
PT Cancellation Note  Patient Details Name: Douglas Peterson. MRN: [id number] DOB: [birth year]   Cancelled Treatment:    Reason Eval/Treat Not Completed: Fatigue/lethargy limiting ability to participate   Pt in hallway, Awakes easily but refuses session and remains curled up on Left side.  Encouraged but continues to decline.   Chesley Noon 07/11/2020, 11:01 AM

## 2020-07-11 NOTE — ED Notes (Signed)
Pt was given iced water.

## 2020-07-11 NOTE — ED Notes (Signed)
Pt given ginger ale at this time by this RN.

## 2020-07-11 NOTE — TOC Progression Note (Signed)
Transition of Care (TOC) - Progression Note    Patient Details  Name: Douglas Peterson. MRN: [id number] Date of Birth: Jul 01, 1951  Transition of Care [facility]) CM/SW Contact  Anselm Pancoast, RN Phone Number: 07/11/2020, 4:04 PM  Clinical Narrative:    Approached patient at bedside. Patient curled up in bed on left side and unable to engage in conversation. Appears to be sleeping.    Expected Discharge Plan: Skilled Nursing Facility Barriers to Discharge: No Barriers Identified  Expected Discharge Plan and Services Expected Discharge Plan: [place] In-house Referral: Clinical Social Work Discharge Planning Services: CM Consult   Living arrangements for the past 2 months: Single Family Home                                       Social Determinants of Health (SDOH) Interventions    Readmission Risk Interventions Readmission Risk Prevention Plan 05/19/2020 12/13/2019 12/12/2019  Transportation Screening Complete Complete Complete  PCP or Specialist Appt within 3-5 Days - (No Data) -  HRI or [place] - (No Data) -  Palliative Care Screening - Complete Complete  Medication Review (RN Care Manager) Complete Complete Complete  PCP or Specialist appointment within 3-5 days of discharge Complete - -  [place] or Home Care Consult Complete - -  SW Recovery Care/Counseling Consult Complete - -  Palliative Care Screening Not Applicable - -  [place] Not Applicable - -  Some recent data might be hidden

## 2020-07-11 NOTE — TOC Progression Note (Signed)
Transition of Care (TOC) - Progression Note    Patient Details  Name: Tylee Newby. MRN: [id number] Date of Birth: 09-May-1951  Transition of Care [facility]) CM/SW Contact  Anselm Pancoast, RN Phone Number: 07/11/2020, 1:54 PM  Clinical Narrative:    Damaris Schooner to [facility] @ [place] [phone] requesting bed availability.Requested Fl2 be faxed to [phone].     Call to [facility] [place]-[phone]-will fax Fl2 to [phone]  LVMM for Page Hickman @ [phone] requesting bed availability.    Expected Discharge Plan: Skilled Nursing Facility Barriers to Discharge: No Barriers Identified  Expected Discharge Plan and Services Expected Discharge Plan: [place] In-house Referral: Clinical Social Work Discharge Planning Services: CM Consult   Living arrangements for the past 2 months: Single Family Home                                       Social Determinants of Health (SDOH) Interventions    Readmission Risk Interventions Readmission Risk Prevention Plan 05/19/2020 12/13/2019 12/12/2019  Transportation Screening Complete Complete Complete  PCP or Specialist Appt within 3-5 Days - (No Data) -  HRI or [place] - (No Data) -  Palliative Care Screening - Complete Complete  Medication Review (RN Care Manager) Complete Complete Complete  PCP or Specialist appointment within 3-5 days of discharge Complete - -  [place] or Home Care Consult Complete - -  SW Recovery Care/Counseling Consult Complete - -  Palliative Care Screening Not Applicable - -  [place] Not Applicable - -  Some recent data might be hidden

## 2020-07-11 NOTE — TOC Progression Note (Signed)
Transition of Care (TOC) - Progression Note    Patient Details  Name: Douglas Peterson. MRN: [id number] Date of Birth: 09-24-1951  Transition of Care [facility]) CM/SW Contact  Anselm Pancoast, RN Phone Number: 07/11/2020, 9:36 AM  Clinical Narrative:    Patient requesting to discharge home. No SNF bed offers made at this time. EMS refusing to take patient home stating reporting patient is in unsafe living situation. TOC RN CM LVMM for [name], DSS-APS updating on situation.    Expected Discharge Plan: Skilled Nursing Facility Barriers to Discharge: No Barriers Identified  Expected Discharge Plan and Services Expected Discharge Plan: [place] In-house Referral: Clinical Social Work Discharge Planning Services: CM Consult   Living arrangements for the past 2 months: Single Family Home                                       Social Determinants of Health (SDOH) Interventions    Readmission Risk Interventions Readmission Risk Prevention Plan 05/19/2020 12/13/2019 12/12/2019  Transportation Screening Complete Complete Complete  PCP or Specialist Appt within 3-5 Days - (No Data) -  HRI or [place] - (No Data) -  Palliative Care Screening - Complete Complete  Medication Review (RN Care Manager) Complete Complete Complete  PCP or Specialist appointment within 3-5 days of discharge Complete - -  [place] or Home Care Consult Complete - -  SW Recovery Care/Counseling Consult Complete - -  Palliative Care Screening Not Applicable - -  [place] Not Applicable - -  Some recent data might be hidden

## 2020-07-12 MED ORDER — THIAMINE HCL 100 MG/ML IJ SOLN: 100.0000 mg | Freq: Every day | INTRAMUSCULAR | Status: DC

## 2020-07-12 MED ORDER — LORAZEPAM 2 MG/ML IJ SOLN: 0.0000 mg | Freq: Four times a day (QID) | INTRAMUSCULAR | Status: DC

## 2020-07-12 MED ORDER — LORAZEPAM 2 MG/ML IJ SOLN: 0.0000 mg | Freq: Two times a day (BID) | INTRAMUSCULAR | Status: DC

## 2020-07-12 MED ORDER — LORAZEPAM 2 MG PO TABS: 0.0000 mg | ORAL_TABLET | Freq: Four times a day (QID) | ORAL | Status: DC

## 2020-07-12 MED ORDER — THIAMINE HCL 100 MG PO TABS
100.0000 mg | ORAL_TABLET | Freq: Every day | ORAL | Status: DC
  Administered 2020-07-13: 100 mg via ORAL

## 2020-07-12 MED ORDER — LORAZEPAM 2 MG PO TABS: 0.0000 mg | ORAL_TABLET | Freq: Two times a day (BID) | ORAL | Status: DC

## 2020-07-12 NOTE — TOC Progression Note (Signed)
Transition of Care (TOC) - Progression Note    Patient Details  Name: Douglas Peterson. MRN: [id number] Date of Birth: 12/31/1950  Transition of Care [facility]) CM/SW Contact  Anselm Pancoast, RN Phone Number: 07/12/2020, 3:07 PM  Clinical Narrative:    Received call from Irven Shelling @ [facility] in [place]. Patient accepted and will transfer tomorrow morning.  Updated [name], APS DSS of anticipated transfer.     Expected Discharge Plan: Skilled Nursing Facility Barriers to Discharge: No Barriers Identified  Expected Discharge Plan and Services Expected Discharge Plan: [place] In-house Referral: Clinical Social Work Discharge Planning Services: CM Consult   Living arrangements for the past 2 months: Single Family Home                                       Social Determinants of Health (SDOH) Interventions    Readmission Risk Interventions Readmission Risk Prevention Plan 05/19/2020 12/13/2019 12/12/2019  Transportation Screening Complete Complete Complete  PCP or Specialist Appt within 3-5 Days - (No Data) -  HRI or [place] - (No Data) -  Palliative Care Screening - Complete Complete  Medication Review (RN Care Manager) Complete Complete Complete  PCP or Specialist appointment within 3-5 days of discharge Complete - -  [place] or Home Care Consult Complete - -  SW Recovery Care/Counseling Consult Complete - -  Palliative Care Screening Not Applicable - -  [place] Not Applicable - -  Some recent data might be hidden

## 2020-07-12 NOTE — ED Notes (Signed)
Patient was advised that he will be going home and refused to go home, patient stated that he told the doctor he would try to ambulate and if unable to ambulate then he will make that decision, cane provided as to this is what the patient states he uses at home. Once ambulance arrived, patient changed his mind and refused to be discharged to home. Md at bedside to discuss the option of going to SNF facility vs being taken home in ambulance. Patient told Dr. Tamala Julian he is now willing to go to a SNF facility. Md to contact SW to make these arrangements. Will monitor.

## 2020-07-12 NOTE — TOC Transition Note (Deleted)
Transition of Care [facility]) - CM/SW Discharge Note   Patient Details  Name: Douglas Peterson. MRN: [id number] Date of Birth: 1951/06/26  Transition of [facility]) CM/SW Contact:  [name] Phone Number: [phone] 07/12/2020, 10:29 AM   Clinical Narrative:     Patient stated he wants to go home and has refused SNF placement.  Patient psychiatrically and medically cleared.  Patient will d/c home, EMS will transport.  EDP/ED Staff updated.  TOC consult complete.  Final next level of care: Skilled Nursing Facility Barriers to Discharge: No Barriers Identified   Patient Goals and CMS Choice Patient states their goals for this hospitalization and ongoing recovery are:: Agreed to go to SNF for rehab CMS Medicare.gov Compare Post Acute Care list provided to:: Patient Choice offered to / list presented to : Patient  Discharge Placement                       Discharge Plan and Services In-house Referral: Clinical Social Work Discharge Planning Services: CM Consult                                 Social Determinants of Health (SDOH) Interventions     Readmission Risk Interventions Readmission Risk Prevention Plan 05/19/2020 12/13/2019 12/12/2019  Transportation Screening Complete Complete Complete  PCP or Specialist Appt within 3-5 Days - (No Data) -  HRI or [place] - (No Data) -  Palliative Care Screening - Complete Complete  Medication Review (RN Care Manager) Complete Complete Complete  PCP or Specialist appointment within 3-5 days of discharge Complete - -  [place] or Home Care Consult Complete - -  SW Recovery Care/Counseling Consult Complete - -  Palliative Care Screening Not Applicable - -  [place] Not Applicable - -  Some recent data might be hidden

## 2020-07-12 NOTE — ED Provider Notes (Deleted)
I assumed care of this patient approximately 0 700.  Please see outgoing providers note for full details regarding patient's initial evaluation assessment.  In brief patient is well-known to this provider in this ED and is here frequently presents after intoxication or fall.  There have been multiple attempts on review of records to place patient in a SNF and I do believe this is reasonable today given patient has some functional weakness at baseline when he is sober from weakness in his right lower extremity.  On my assessment patient is clinically sober and hemodynamically stable.  He does not appear intoxicated he does not appear to suffering from any significant metabolic derangement.  [name] adamantly refusing to be discharged home despite risks of repeat falls.  Patient was able to state this back to myself and again was adamant he would be discharged after explained that would be more than happy to assist him in being placement in a SNF.  Given patient does have capacity he will be discharged against advice.   Lucrezia Starch, MD 07/12/20 1030

## 2020-07-12 NOTE — ED Notes (Signed)
Called [facility] for transport to [place]

## 2020-07-12 NOTE — ED Notes (Signed)
SW placement, pt accepted to [facility] in [place] and will transport on 9.10.21

## 2020-07-12 NOTE — ED Notes (Signed)
Assumed care of patient , patient laying in hospital bed. Reports to Probation officer that he is ready to go home. It was explained to patient that social worker is working on placement for him. Patient got upset and stated that he wants to be discharged to his home. He is refusing SNF placement and wants social worker to arrange home health at his home. Patient was told that SW will be updated with his concerns and that she will be down to speak with him about discharge plan of care. Safety maintained will continue to monitor.

## 2020-07-12 NOTE — TOC Transition Note (Signed)
Transition of Care [facility]) - CM/SW Discharge Note   Patient Details  Name: Douglas Peterson. MRN: [id number] Date of Birth: 20-Oct-1951  Transition of Care [facility]) CM/SW Contact:  [name] Phone Number: [phone] 07/12/2020, 10:06 AM   Clinical Narrative:     Patient requested to return home, patient stated he does not want SNF placement.  ED/RN stated the patient told her he wants to "go back to my house, that I built with my two hands."Patient will d/c home via EMS.  CSW has updated EDP/ED staff.  TOC consult complete.  Final next level of care: Skilled Nursing Facility Barriers to Discharge: No Barriers Identified   Patient Goals and CMS Choice Patient states their goals for this hospitalization and ongoing recovery are:: Agreed to go to SNF for rehab CMS Medicare.gov Compare Post Acute Care list provided to:: Patient Choice offered to / list presented to : Patient  Discharge Placement                       Discharge Plan and Services In-house Referral: Clinical Social Work Discharge Planning Services: CM Consult                                 Social Determinants of Health (SDOH) Interventions     Readmission Risk Interventions Readmission Risk Prevention Plan 05/19/2020 12/13/2019 12/12/2019  Transportation Screening Complete Complete Complete  PCP or Specialist Appt within 3-5 Days - (No Data) -  HRI or [place] - (No Data) -  Palliative Care Screening - Complete Complete  Medication Review (RN Care Manager) Complete Complete Complete  PCP or Specialist appointment within 3-5 days of discharge Complete - -  [place] or Home Care Consult Complete - -  SW Recovery Care/Counseling Consult Complete - -  Palliative Care Screening Not Applicable - -  [place] Not Applicable - -  Some recent data might be hidden

## 2020-07-12 NOTE — ED Provider Notes (Signed)
Patient will be reassessed by PT and SW today. Per review of records, psychiatry has evaluated multiple times and most recently confirmed pt does NOT have medical capacity for decision making. Unclear if DSS has become guardian. As such, pt lacks capacity to make decision to leave ED on his own accord - will need SNF placement or further discussion w/ Guardian.   Duffy Bruce, MD 07/12/20 [place] Hare

## 2020-07-12 NOTE — ED Notes (Signed)
Patient sleeping

## 2020-07-12 NOTE — ED Notes (Signed)
Pt urinated in brief.  Patient changed and cleaned with dry brief and sheet to bed.  Patient given warm blanket and lying comfortably and denies needs at this time.

## 2020-07-12 NOTE — Progress Notes (Signed)
PT Cancellation Note  Patient Details Name: Douglas Peterson. MRN: [id number] DOB: 10/06/1951   Cancelled Treatment:    Reason Eval/Treat Not Completed: Fatigue/lethargy limiting ability to participate   Chart reviewed.  Unstable discharge plan noted.  Offered and encouraged mobility to assist with discharge planning.  Pt in bed and keeps eyes closed for most of conversation.  Weak voice.  He refuses attempts at mobility and shakes his head "no" with only verbalization being "sick."  Pt is well known to writer from multiple prior admissions.  He is typically excited to participate in therapy sessions to increase mobility and rarely declines.  Will continue to offer sessions to assist with appropriate discharge planning.    Chesley Noon 07/12/2020, 1:24 PM

## 2020-07-12 NOTE — ED Notes (Signed)
This Rn assumed care of pt. Pt resting in bed comfortably. NAD noted by this RN.

## 2020-07-12 NOTE — TOC Progression Note (Signed)
Transition of Care (TOC) - Progression Note    Patient Details  Name: Leamon Palau. MRN: [id number] Date of Birth: 11/11/1950  Transition of Care [facility]) CM/SW [place], [place] Phone Number: [phone] 07/12/2020, 2:44 PM  Clinical Narrative:     Patietn stated he would be wiling to go to a SNF.  [facility] in [place] is currently the only SNF that has accepted the patient.  This CSW called and left a voicemail for Irven Shelling Admission Coordinator to confirm bed availability.  Expected Discharge Plan: Skilled Nursing Facility Barriers to Discharge: No Barriers Identified  Expected Discharge Plan and Services Expected Discharge Plan: [place] In-house Referral: Clinical Social Work Discharge Planning Services: CM Consult   Living arrangements for the past 2 months: Single Family Home                                       Social Determinants of Health (SDOH) Interventions    Readmission Risk Interventions Readmission Risk Prevention Plan 05/19/2020 12/13/2019 12/12/2019  Transportation Screening Complete Complete Complete  PCP or Specialist Appt within 3-5 Days - (No Data) -  HRI or [place] - (No Data) -  Palliative Care Screening - Complete Complete  Medication Review (RN Care Manager) Complete Complete Complete  PCP or Specialist appointment within 3-5 days of discharge Complete - -  [place] or Home Care Consult Complete - -  SW Recovery Care/Counseling Consult Complete - -  Palliative Care Screening Not Applicable - -  [place] Not Applicable - -  Some recent data might be hidden

## 2020-07-13 LAB — SARS CORONAVIRUS 2 BY RT PCR (HOSPITAL ORDER, PERFORMED IN ~~LOC~~ HOSPITAL LAB): SARS Coronavirus 2: NEGATIVE

## 2020-07-13 NOTE — TOC Transition Note (Signed)
Transition of Care [facility]) - CM/SW Discharge Note   Patient Details  Name: Douglas Peterson. MRN: [id number] Date of Birth: 1951-04-13  Transition of Care [facility]) CM/SW Contact:  [name] Phone Number: [phone] 07/13/2020, 1:57 PM   Clinical Narrative:     Patient will d/c to [facility], Room # B21, Report# [phone].  EDP/ED Staff notified and patient's daughter [name] notified. TOC consult complete.   Final next level of care: Skilled Nursing Facility Barriers to Discharge: No Barriers Identified   Patient Goals and CMS Choice Patient states their goals for this hospitalization and ongoing recovery are:: Agreed to go to SNF for rehab CMS Medicare.gov Compare Post Acute Care list provided to:: Patient Choice offered to / list presented to : Patient  Discharge Placement                       Discharge Plan and Services In-house Referral: Clinical Social Work Discharge Planning Services: CM Consult                                 Social Determinants of Health (SDOH) Interventions     Readmission Risk Interventions Readmission Risk Prevention Plan 05/19/2020 12/13/2019 12/12/2019  Transportation Screening Complete Complete Complete  PCP or Specialist Appt within 3-5 Days - (No Data) -  HRI or [place] - (No Data) -  Palliative Care Screening - Complete Complete  Medication Review (RN Care Manager) Complete Complete Complete  PCP or Specialist appointment within 3-5 days of discharge Complete - -  [place] or Home Care Consult Complete - -  SW Recovery Care/Counseling Consult Complete - -  Palliative Care Screening Not Applicable - -  [place] Not Applicable - -  Some recent data might be hidden

## 2020-07-13 NOTE — ED Notes (Signed)
Pt refuses to sign. Pt refusing to go to nursing home but isn't able to answer basic questions. Dr. Tamala Julian, social work, and psychiatrist agree that pt is unable to make decisions. Pt to be transported to SNF.

## 2020-07-13 NOTE — ED Notes (Signed)
Report to [facility] in [place] and Education officer, museum.

## 2020-07-13 NOTE — ED Notes (Signed)
This nurse assumes care of pt at this time, pt is asleep in his bed

## 2020-07-13 NOTE — ED Notes (Signed)
Pt asked if he could walk up and down hall. Pt was able to ambulate with stand by assistance from Probation officer. Pt is now

## 2020-07-13 NOTE — TOC Progression Note (Addendum)
Transition of Care (TOC) - Progression Note    Patient Details  Name: Douglas Peterson. MRN: [id number] Date of Birth: 1950/12/03  Transition of Care [facility]) CM/SW [place], [place] Phone Number: [phone] 07/13/2020, 10:21 AM  Clinical Narrative:     Spoke with Dr. Janese Banks and Dr. Tamala Julian, patient does not have capacity, but also does not have a legal guardian.  CSW contacted his daughter [name] [phone], to make decisions for him and she confirmed the patient was good to go to [facility] in [place].   CSW spoke with patient, who did not remember speaking with this CSW, EDP, PT or RN yesterday.  This CSW reminded patient that he wanted to go to a SNF and informed him that one had accepted him. Patient asked some questions about the SNF and also wanted EMS to take him home first.  CSW explained the to the patient EMS would not be able to take him home but that I could contact his daughter and ask if she was willing to pick up his things. Patient gave this CSW a list.  CSW contacted patient's daughter and she is willing to pick up his things from home.  CSW contacted Irven Shelling [phone], [facility] admissions coordinator.  CSW requested report number and room number.  Jackelyn Poling stated she would contact this CSW with that information.  Debbie stated she needed to rapid COVID for the patient.  This CSW updated EDP/ED Staff on the rapid COVID request.  Expected Discharge Plan: Skilled Nursing Facility Barriers to Discharge: No Barriers Identified  Expected Discharge Plan and Services Expected Discharge Plan: [place] In-house Referral: Clinical Social Work Discharge Planning Services: CM Consult   Living arrangements for the past 2 months: Single Family Home                                       Social Determinants of Health (SDOH) Interventions    Readmission Risk Interventions Readmission Risk Prevention Plan 05/19/2020 12/13/2019  12/12/2019  Transportation Screening Complete Complete Complete  PCP or Specialist Appt within 3-5 Days - (No Data) -  HRI or [place] - (No Data) -  Palliative Care Screening - Complete Complete  Medication Review (RN Care Manager) Complete Complete Complete  PCP or Specialist appointment within 3-5 days of discharge Complete - -  [place] or Home Care Consult Complete - -  SW Recovery Care/Counseling Consult Complete - -  Palliative Care Screening Not Applicable - -  [place] Not Applicable - -  Some recent data might be hidden

## 2020-07-13 NOTE — ED Notes (Signed)
Pt resting in bed in NAD. RR is even and unlabored.

## 2020-07-13 NOTE — ED Notes (Addendum)
Pt continues resting in bed. NAD at this time. Pt reporting he has not been updated on transfer and does not know the plan. Pt requesting to talk to someone about treatment plan.

## 2020-07-13 NOTE — ED Notes (Signed)
Pt sitting on the edge of the bed at this time. NAD at this time.

## 2020-07-13 NOTE — TOC Progression Note (Signed)
Transition of Care (TOC) - Progression Note    Patient Details  Name: Seneca Hoback. MRN: [id number] Date of Birth: Jul 20, 1951  Transition of Care [facility]) CM/SW [place], [place] Phone Number: [phone] 07/13/2020, 9:05 AM  Clinical Narrative:     Patient has capacity, does NOT have a guardian.  There is a pending hearing w/ DSS, but until that hearing occurs, legally the patient has capacity to make his own decisions.  The patient has been accepted to [facility] in [place].  Patient stated yesterday 9/8, he wanted to go to SNF, instead of returning home.   Expected Discharge Plan: Skilled Nursing Facility Barriers to Discharge: No Barriers Identified  Expected Discharge Plan and Services Expected Discharge Plan: [place] In-house Referral: Clinical Social Work Discharge Planning Services: CM Consult   Living arrangements for the past 2 months: Single Family Home                                       Social Determinants of Health (SDOH) Interventions    Readmission Risk Interventions Readmission Risk Prevention Plan 05/19/2020 12/13/2019 12/12/2019  Transportation Screening Complete Complete Complete  PCP or Specialist Appt within 3-5 Days - (No Data) -  HRI or [place] - (No Data) -  Palliative Care Screening - Complete Complete  Medication Review (RN Care Manager) Complete Complete Complete  PCP or Specialist appointment within 3-5 days of discharge Complete - -  [place] or Home Care Consult Complete - -  SW Recovery Care/Counseling Consult Complete - -  Palliative Care Screening Not Applicable - -  [place] Not Applicable - -  Some recent data might be hidden

## 2020-08-05 ENCOUNTER — Inpatient Hospital Stay: Payer: Medicare HMO

## 2020-08-05 ENCOUNTER — Encounter: Payer: Self-pay | Admitting: Radiology

## 2020-08-05 ENCOUNTER — Emergency Department: Payer: Medicare HMO

## 2020-08-05 ENCOUNTER — Inpatient Hospital Stay
Admission: EM | Admit: 2020-08-05 | Discharge: 2020-08-14 | DRG: 896 | Disposition: A | Discharge status: Home Health | Payer: Medicare HMO | Attending: Internal Medicine | Admitting: Internal Medicine

## 2020-08-05 ENCOUNTER — Other Ambulatory Visit: Payer: Self-pay

## 2020-08-05 DIAGNOSIS — F32A Depression, unspecified: Secondary | ICD-10-CM | POA: Diagnosis not present

## 2020-08-05 DIAGNOSIS — Z7982 Long term (current) use of aspirin: Secondary | ICD-10-CM | POA: Diagnosis not present

## 2020-08-05 DIAGNOSIS — I10 Essential (primary) hypertension: Secondary | ICD-10-CM | POA: Diagnosis present

## 2020-08-05 DIAGNOSIS — F10230 Alcohol dependence with withdrawal, uncomplicated: Principal | ICD-10-CM | POA: Diagnosis present

## 2020-08-05 DIAGNOSIS — R6510 Systemic inflammatory response syndrome (SIRS) of non-infectious origin without acute organ dysfunction: Secondary | ICD-10-CM | POA: Diagnosis present

## 2020-08-05 DIAGNOSIS — E860 Dehydration: Secondary | ICD-10-CM | POA: Diagnosis present

## 2020-08-05 DIAGNOSIS — E440 Moderate protein-calorie malnutrition: Secondary | ICD-10-CM | POA: Diagnosis present

## 2020-08-05 DIAGNOSIS — E876 Hypokalemia: Secondary | ICD-10-CM | POA: Diagnosis present

## 2020-08-05 DIAGNOSIS — E871 Hypo-osmolality and hyponatremia: Secondary | ICD-10-CM | POA: Diagnosis present

## 2020-08-05 DIAGNOSIS — D72829 Elevated white blood cell count, unspecified: Secondary | ICD-10-CM | POA: Diagnosis present

## 2020-08-05 DIAGNOSIS — R0682 Tachypnea, not elsewhere classified: Secondary | ICD-10-CM | POA: Diagnosis present

## 2020-08-05 DIAGNOSIS — M25551 Pain in right hip: Secondary | ICD-10-CM | POA: Diagnosis present

## 2020-08-05 DIAGNOSIS — G9341 Metabolic encephalopathy: Secondary | ICD-10-CM | POA: Diagnosis present

## 2020-08-05 DIAGNOSIS — E872 Acidosis: Secondary | ICD-10-CM | POA: Diagnosis present

## 2020-08-05 DIAGNOSIS — Z789 Other specified health status: Secondary | ICD-10-CM

## 2020-08-05 DIAGNOSIS — Y908 Blood alcohol level of 240 mg/100 ml or more: Secondary | ICD-10-CM | POA: Diagnosis present

## 2020-08-05 DIAGNOSIS — K2100 Gastro-esophageal reflux disease with esophagitis, without bleeding: Secondary | ICD-10-CM | POA: Diagnosis not present

## 2020-08-05 DIAGNOSIS — Z79899 Other long term (current) drug therapy: Secondary | ICD-10-CM

## 2020-08-05 DIAGNOSIS — I480 Paroxysmal atrial fibrillation: Secondary | ICD-10-CM | POA: Diagnosis present

## 2020-08-05 DIAGNOSIS — A419 Sepsis, unspecified organism: Principal | ICD-10-CM | POA: Diagnosis present

## 2020-08-05 DIAGNOSIS — R197 Diarrhea, unspecified: Secondary | ICD-10-CM | POA: Diagnosis present

## 2020-08-05 DIAGNOSIS — Z87891 Personal history of nicotine dependence: Secondary | ICD-10-CM | POA: Diagnosis not present

## 2020-08-05 DIAGNOSIS — Z20822 Contact with and (suspected) exposure to covid-19: Secondary | ICD-10-CM | POA: Diagnosis present

## 2020-08-05 DIAGNOSIS — Z8249 Family history of ischemic heart disease and other diseases of the circulatory system: Secondary | ICD-10-CM

## 2020-08-05 DIAGNOSIS — E861 Hypovolemia: Secondary | ICD-10-CM | POA: Diagnosis present

## 2020-08-05 DIAGNOSIS — I69351 Hemiplegia and hemiparesis following cerebral infarction affecting right dominant side: Secondary | ICD-10-CM | POA: Diagnosis not present

## 2020-08-05 DIAGNOSIS — R490 Dysphonia: Secondary | ICD-10-CM | POA: Diagnosis present

## 2020-08-05 DIAGNOSIS — F1090 Alcohol use, unspecified, uncomplicated: Secondary | ICD-10-CM

## 2020-08-05 DIAGNOSIS — Z7289 Other problems related to lifestyle: Secondary | ICD-10-CM | POA: Diagnosis not present

## 2020-08-05 DIAGNOSIS — R000 Tachycardia, unspecified: Secondary | ICD-10-CM | POA: Diagnosis present

## 2020-08-05 DIAGNOSIS — K219 Gastro-esophageal reflux disease without esophagitis: Secondary | ICD-10-CM | POA: Diagnosis present

## 2020-08-05 DIAGNOSIS — K7460 Unspecified cirrhosis of liver: Secondary | ICD-10-CM | POA: Diagnosis present

## 2020-08-05 DIAGNOSIS — K5900 Constipation, unspecified: Secondary | ICD-10-CM | POA: Diagnosis present

## 2020-08-05 LAB — COMPREHENSIVE METABOLIC PANEL
ALT: 40 U/L (ref 0–44)
AST: 61 U/L — ABNORMAL HIGH (ref 15–41)
Albumin: 4.7 g/dL (ref 3.5–5.0)
Alkaline Phosphatase: 103 U/L (ref 38–126)
Anion gap: 14 (ref 5–15)
BUN: <5 mg/dL — ABNORMAL LOW (ref 8–23)
CO2: 28 mmol/L (ref 22–32)
Calcium: 8.7 mg/dL — ABNORMAL LOW (ref 8.9–10.3)
Chloride: 87 mmol/L — ABNORMAL LOW (ref 98–111)
Creatinine, Ser: 0.45 mg/dL — ABNORMAL LOW (ref 0.61–1.24)
GFR calc Af Amer: >60 mL/min (ref >60)
GFR calc non Af Amer: >60 mL/min (ref >60)
Glucose, Bld: 106 mg/dL — ABNORMAL HIGH (ref 70–99)
Potassium: 4 mmol/L (ref 3.5–5.1)
Sodium: 129 mmol/L — ABNORMAL LOW (ref 135–145)
Total Bilirubin: 1 mg/dL (ref 0.3–1.2)
Total Protein: 8.3 g/dL — ABNORMAL HIGH (ref 6.5–8.1)

## 2020-08-05 LAB — CBC WITH DIFFERENTIAL/PLATELET
Abs Immature Granulocytes: 0.06 10*3/uL (ref 0.00–0.07)
Basophils Absolute: 0.1 10*3/uL (ref 0.0–0.1)
Basophils Relative: 0 %
Eosinophils Absolute: 0 10*3/uL (ref 0.0–0.5)
Eosinophils Relative: 0 %
HCT: 46.7 % (ref 39.0–52.0)
Hemoglobin: 16.5 g/dL (ref 13.0–17.0)
Immature Granulocytes: 0 %
Lymphocytes Relative: 55 %
Lymphs Abs: 11.3 10*3/uL — ABNORMAL HIGH (ref 0.7–4.0)
MCH: 30 pg (ref 26.0–34.0)
MCHC: 35.3 g/dL (ref 30.0–36.0)
MCV: 84.9 fL (ref 80.0–100.0)
Monocytes Absolute: 1.1 10*3/uL — ABNORMAL HIGH (ref 0.1–1.0)
Monocytes Relative: 6 %
Neutro Abs: 8.1 10*3/uL — ABNORMAL HIGH (ref 1.7–7.7)
Neutrophils Relative %: 39 %
Platelets: 232 10*3/uL (ref 150–400)
RBC: 5.5 MIL/uL (ref 4.22–5.81)
RDW: 13 % (ref 11.5–15.5)
WBC: 20.7 10*3/uL — ABNORMAL HIGH (ref 4.0–10.5)
nRBC: 0 % (ref 0.0–0.2)

## 2020-08-05 LAB — COMPREHENSIVE METABOLIC PANEL WITH GFR
ALT: 29 U/L (ref 0–44)
AST: 42 U/L — ABNORMAL HIGH (ref 15–41)
Albumin: 3.5 g/dL (ref 3.5–5.0)
Alkaline Phosphatase: 84 U/L (ref 38–126)
Anion gap: 10 (ref 5–15)
BUN: <5 mg/dL — ABNORMAL LOW (ref 8–23)
CO2: 30 mmol/L (ref 22–32)
Calcium: 8.4 mg/dL — ABNORMAL LOW (ref 8.9–10.3)
Chloride: 97 mmol/L — ABNORMAL LOW (ref 98–111)
Creatinine, Ser: 0.4 mg/dL — ABNORMAL LOW (ref 0.61–1.24)
GFR calc Af Amer: >60 mL/min
GFR calc non Af Amer: >60 mL/min
Glucose, Bld: 100 mg/dL — ABNORMAL HIGH (ref 70–99)
Potassium: 3.4 mmol/L — ABNORMAL LOW (ref 3.5–5.1)
Sodium: 137 mmol/L (ref 135–145)
Total Bilirubin: 1 mg/dL (ref 0.3–1.2)
Total Protein: 6.1 g/dL — ABNORMAL LOW (ref 6.5–8.1)

## 2020-08-05 LAB — URINALYSIS, COMPLETE (UACMP) WITH MICROSCOPIC
Bacteria, UA: NONE SEEN
Bilirubin Urine: NEGATIVE
Glucose, UA: NEGATIVE mg/dL
Ketones, ur: NEGATIVE mg/dL
Leukocytes,Ua: NEGATIVE
Nitrite: NEGATIVE
Protein, ur: NEGATIVE mg/dL
Specific Gravity, Urine: 1.001 — ABNORMAL LOW (ref 1.005–1.030)
Squamous Epithelial / HPF: NONE SEEN (ref 0–5)
WBC, UA: NONE SEEN WBC/hpf (ref 0–5)
pH: 6 (ref 5.0–8.0)

## 2020-08-05 LAB — SODIUM, URINE, RANDOM: Sodium, Ur: 11 mmol/L

## 2020-08-05 LAB — OSMOLALITY, URINE: Osmolality, Ur: 131 mOsm/kg — ABNORMAL LOW (ref 300–900)

## 2020-08-05 LAB — URINE DRUG SCREEN, QUALITATIVE (ARMC ONLY)
Amphetamines, Ur Screen: NOT DETECTED
Barbiturates, Ur Screen: NOT DETECTED
Benzodiazepine, Ur Scrn: NOT DETECTED
Cannabinoid 50 Ng, Ur ~~LOC~~: NOT DETECTED
Cocaine Metabolite,Ur ~~LOC~~: NOT DETECTED
MDMA (Ecstasy)Ur Screen: NOT DETECTED
Methadone Scn, Ur: NOT DETECTED
Opiate, Ur Screen: NOT DETECTED
Phencyclidine (PCP) Ur S: NOT DETECTED
Tricyclic, Ur Screen: NOT DETECTED

## 2020-08-05 LAB — PROTIME-INR
INR: 1 (ref 0.8–1.2)
Prothrombin Time: 13.1 seconds (ref 11.4–15.2)

## 2020-08-05 LAB — LACTIC ACID, PLASMA
Lactic Acid, Venous: 4.8 mmol/L (ref 0.5–1.9)
Lactic Acid, Venous: 5.6 mmol/L (ref 0.5–1.9)
Lactic Acid, Venous: 2.2 mmol/L (ref 0.5–1.9)
Lactic Acid, Venous: 1.5 mmol/L (ref 0.5–1.9)

## 2020-08-05 LAB — BASIC METABOLIC PANEL
Anion gap: 10 (ref 5–15)
BUN: <5 mg/dL — ABNORMAL LOW (ref 8–23)
CO2: 28 mmol/L (ref 22–32)
Calcium: 8.3 mg/dL — ABNORMAL LOW (ref 8.9–10.3)
Chloride: 97 mmol/L — ABNORMAL LOW (ref 98–111)
Creatinine, Ser: 0.43 mg/dL — ABNORMAL LOW (ref 0.61–1.24)
GFR calc Af Amer: >60 mL/min (ref >60)
GFR calc non Af Amer: >60 mL/min (ref >60)
Glucose, Bld: 88 mg/dL (ref 70–99)
Potassium: 4.3 mmol/L (ref 3.5–5.1)
Sodium: 135 mmol/L (ref 135–145)

## 2020-08-05 LAB — RESPIRATORY PANEL BY RT PCR (FLU A&B, COVID)
Influenza A by PCR: NEGATIVE
Influenza B by PCR: NEGATIVE
SARS Coronavirus 2 by RT PCR: NEGATIVE

## 2020-08-05 LAB — PHOSPHORUS: Phosphorus: 1.4 mg/dL — ABNORMAL LOW (ref 2.5–4.6)

## 2020-08-05 LAB — LIPASE, BLOOD: Lipase: 23 U/L (ref 11–51)

## 2020-08-05 LAB — ETHANOL: Alcohol, Ethyl (B): 268 mg/dL — ABNORMAL HIGH (ref <10)

## 2020-08-05 LAB — CK: Total CK: 364 U/L (ref 49–397)

## 2020-08-05 LAB — PROCALCITONIN: Procalcitonin: <0.1 ng/mL

## 2020-08-05 LAB — APTT: aPTT: 33 seconds (ref 24–36)

## 2020-08-05 LAB — OSMOLALITY: Osmolality: 284 mOsm/kg (ref 275–295)

## 2020-08-05 MED ORDER — VANCOMYCIN HCL 750 MG/150ML IV SOLN
750.0000 mg | Freq: Two times a day (BID) | INTRAVENOUS | Status: DC
  Administered 2020-08-05 – 2020-08-08 (×6): 750 mg via INTRAVENOUS
  Filled 2020-08-05 (×10): qty 150

## 2020-08-05 MED ORDER — DEXTROSE 5 % IV SOLN: INTRAVENOUS | Status: DC

## 2020-08-05 MED ORDER — LORAZEPAM 2 MG/ML IJ SOLN
0.0000 mg | Freq: Two times a day (BID) | INTRAMUSCULAR | Status: AC
  Administered 2020-08-08: 1 mg via INTRAVENOUS
  Filled 2020-08-05: qty 1

## 2020-08-05 MED ORDER — SODIUM CHLORIDE 0.9 % IV SOLN
2.0000 g | Freq: Three times a day (TID) | INTRAVENOUS | Status: DC
  Administered 2020-08-05 – 2020-08-08 (×9): 2 g via INTRAVENOUS
  Filled 2020-08-05 (×12): qty 2

## 2020-08-05 MED ORDER — FOLIC ACID 1 MG PO TABS
1.0000 mg | ORAL_TABLET | Freq: Every day | ORAL | Status: DC
  Administered 2020-08-06 – 2020-08-14 (×9): 1 mg via ORAL
  Filled 2020-08-05 (×9): qty 1

## 2020-08-05 MED ORDER — BACLOFEN 10 MG PO TABS
10.0000 mg | ORAL_TABLET | Freq: Three times a day (TID) | ORAL | Status: DC
  Administered 2020-08-05 – 2020-08-06 (×4): 10 mg via ORAL
  Filled 2020-08-05 (×6): qty 1

## 2020-08-05 MED ORDER — LORAZEPAM 2 MG/ML IJ SOLN
0.0000 mg | Freq: Four times a day (QID) | INTRAMUSCULAR | Status: AC
  Administered 2020-08-05 – 2020-08-06 (×2): 2 mg via INTRAVENOUS
  Filled 2020-08-05: qty 1

## 2020-08-05 MED ORDER — PANTOPRAZOLE SODIUM 40 MG PO TBEC
40.0000 mg | DELAYED_RELEASE_TABLET | Freq: Two times a day (BID) | ORAL | Status: DC
  Administered 2020-08-05 – 2020-08-14 (×18): 40 mg via ORAL
  Filled 2020-08-05 (×18): qty 1

## 2020-08-05 MED ORDER — FOLIC ACID 1 MG PO TABS
1.0000 mg | ORAL_TABLET | Freq: Once | ORAL | Status: AC
  Administered 2020-08-05: 1 mg via ORAL
  Filled 2020-08-05: qty 1

## 2020-08-05 MED ORDER — CALCIUM CARBONATE-VITAMIN D 500-200 MG-UNIT PO TABS
1.0000 | ORAL_TABLET | Freq: Every day | ORAL | Status: DC
  Administered 2020-08-06 – 2020-08-14 (×9): 1 via ORAL
  Filled 2020-08-05 (×10): qty 1

## 2020-08-05 MED ORDER — LORAZEPAM 2 MG/ML IJ SOLN
1.0000 mg | INTRAMUSCULAR | Status: AC | PRN
  Filled 2020-08-05: qty 1

## 2020-08-05 MED ORDER — POTASSIUM PHOSPHATES 15 MMOLE/5ML IV SOLN
30.0000 mmol | Freq: Once | INTRAVENOUS | Status: AC
  Administered 2020-08-05: 30 mmol via INTRAVENOUS
  Filled 2020-08-05: qty 10

## 2020-08-05 MED ORDER — ADULT MULTIVITAMIN W/MINERALS CH
1.0000 | ORAL_TABLET | Freq: Every day | ORAL | Status: DC
  Administered 2020-08-05 – 2020-08-14 (×10): 1 via ORAL
  Filled 2020-08-05 (×10): qty 1

## 2020-08-05 MED ORDER — THIAMINE HCL 100 MG/ML IJ SOLN
100.0000 mg | Freq: Every day | INTRAMUSCULAR | Status: DC
  Filled 2020-08-05: qty 2

## 2020-08-05 MED ORDER — LACTATED RINGERS IV BOLUS
1000.0000 mL | Freq: Once | INTRAVENOUS | Status: AC
  Administered 2020-08-05: 1000 mL via INTRAVENOUS

## 2020-08-05 MED ORDER — IOHEXOL 300 MG/ML  SOLN
100.0000 mL | Freq: Once | INTRAMUSCULAR | Status: AC | PRN
  Administered 2020-08-05: 100 mL via INTRAVENOUS

## 2020-08-05 MED ORDER — OXYCODONE HCL 5 MG PO TABS
5.0000 mg | ORAL_TABLET | Freq: Four times a day (QID) | ORAL | Status: DC | PRN
  Administered 2020-08-10 – 2020-08-13 (×2): 5 mg via ORAL
  Filled 2020-08-05 (×2): qty 1

## 2020-08-05 MED ORDER — DOXEPIN HCL 10 MG PO CAPS
10.0000 mg | ORAL_CAPSULE | Freq: Every evening | ORAL | Status: DC | PRN
  Filled 2020-08-05: qty 1

## 2020-08-05 MED ORDER — THIAMINE HCL 100 MG PO TABS
100.0000 mg | ORAL_TABLET | Freq: Every day | ORAL | Status: DC
  Administered 2020-08-06 – 2020-08-11 (×6): 100 mg via ORAL
  Filled 2020-08-05 (×6): qty 1

## 2020-08-05 MED ORDER — VANCOMYCIN HCL IN DEXTROSE 1-5 GM/200ML-% IV SOLN
1000.0000 mg | Freq: Once | INTRAVENOUS | Status: AC
  Administered 2020-08-05: 1000 mg via INTRAVENOUS
  Filled 2020-08-05: qty 200

## 2020-08-05 MED ORDER — LORAZEPAM 1 MG PO TABS
1.0000 mg | ORAL_TABLET | ORAL | Status: AC | PRN
  Administered 2020-08-05 – 2020-08-07 (×4): 1 mg via ORAL
  Filled 2020-08-05: qty 1
  Filled 2020-08-05: qty 2
  Filled 2020-08-05 (×2): qty 1

## 2020-08-05 MED ORDER — ONDANSETRON HCL 4 MG/2ML IJ SOLN
4.0000 mg | Freq: Three times a day (TID) | INTRAMUSCULAR | Status: DC | PRN
  Administered 2020-08-07: 4 mg via INTRAVENOUS
  Filled 2020-08-05: qty 2

## 2020-08-05 MED ORDER — ATENOLOL 25 MG PO TABS
25.0000 mg | ORAL_TABLET | Freq: Two times a day (BID) | ORAL | Status: DC
  Administered 2020-08-05 – 2020-08-14 (×19): 25 mg via ORAL
  Filled 2020-08-05 (×22): qty 1

## 2020-08-05 MED ORDER — VITAMIN D 25 MCG (1000 UNIT) PO TABS
5000.0000 [IU] | ORAL_TABLET | Freq: Every day | ORAL | Status: DC
  Administered 2020-08-06 – 2020-08-14 (×9): 5000 [IU] via ORAL
  Filled 2020-08-05 (×10): qty 5

## 2020-08-05 MED ORDER — ENSURE ENLIVE PO LIQD
237.0000 mL | Freq: Two times a day (BID) | ORAL | Status: DC
  Administered 2020-08-07 – 2020-08-14 (×15): 237 mL via ORAL

## 2020-08-05 MED ORDER — PAROXETINE HCL 30 MG PO TABS
30.0000 mg | ORAL_TABLET | Freq: Every day | ORAL | Status: DC
  Administered 2020-08-05 – 2020-08-14 (×9): 30 mg via ORAL
  Filled 2020-08-05 (×13): qty 1

## 2020-08-05 MED ORDER — ASPIRIN EC 81 MG PO TBEC
81.0000 mg | DELAYED_RELEASE_TABLET | Freq: Every day | ORAL | Status: DC
  Administered 2020-08-05 – 2020-08-14 (×10): 81 mg via ORAL
  Filled 2020-08-05 (×10): qty 1

## 2020-08-05 MED ORDER — HYDROXYZINE HCL 25 MG PO TABS
25.0000 mg | ORAL_TABLET | Freq: Three times a day (TID) | ORAL | Status: DC | PRN
  Administered 2020-08-06 – 2020-08-09 (×2): 25 mg via ORAL
  Filled 2020-08-05 (×2): qty 1

## 2020-08-05 MED ORDER — SODIUM CHLORIDE 0.9 % IV SOLN
1.0000 g | Freq: Once | INTRAVENOUS | Status: AC
  Administered 2020-08-05: 1 g via INTRAVENOUS
  Filled 2020-08-05: qty 1

## 2020-08-05 MED ORDER — CHLORDIAZEPOXIDE HCL 25 MG PO CAPS
50.0000 mg | ORAL_CAPSULE | Freq: Three times a day (TID) | ORAL | Status: DC
  Administered 2020-08-05: 50 mg via ORAL
  Filled 2020-08-05: qty 2

## 2020-08-05 MED ORDER — CHLORDIAZEPOXIDE HCL 25 MG PO CAPS
25.0000 mg | ORAL_CAPSULE | Freq: Three times a day (TID) | ORAL | Status: DC
  Administered 2020-08-05 – 2020-08-08 (×10): 25 mg via ORAL
  Filled 2020-08-05 (×10): qty 1

## 2020-08-05 MED ORDER — THIAMINE HCL 100 MG/ML IJ SOLN
100.0000 mg | Freq: Once | INTRAMUSCULAR | Status: AC
  Administered 2020-08-05: 100 mg via INTRAVENOUS
  Filled 2020-08-05: qty 2

## 2020-08-05 MED ORDER — ENOXAPARIN SODIUM 40 MG/0.4ML ~~LOC~~ SOLN
40.0000 mg | SUBCUTANEOUS | Status: DC
  Administered 2020-08-06 – 2020-08-13 (×9): 40 mg via SUBCUTANEOUS
  Filled 2020-08-05 (×9): qty 0.4

## 2020-08-05 MED ORDER — LACTATED RINGERS IV BOLUS
2000.0000 mL | Freq: Once | INTRAVENOUS | Status: AC
  Administered 2020-08-05: 2000 mL via INTRAVENOUS

## 2020-08-05 MED ORDER — ASCORBIC ACID 500 MG PO TABS
1000.0000 mg | ORAL_TABLET | Freq: Every day | ORAL | Status: DC
  Administered 2020-08-05 – 2020-08-14 (×10): 1000 mg via ORAL
  Filled 2020-08-05 (×10): qty 2

## 2020-08-05 NOTE — Consult Note (Signed)
Pharmacy Antibiotic Note  Douglas Peterson. is a 69 y.o. male admitted on 08/05/2020 with sepsis.  Pharmacy has been consulted for cefepime and vancomycin dosing.CXR negative. UA unimpressive. Afeb, WBC 20, lactic acid 5.6,   Plan: Cefepime 2 g q8H   Vancomycin 1000 mg received in the ED (loading dose). Will order vancomycin 750 mg q12H per [place] nomogram. Plan to order vancomycin level prior to the 4th or 5th dose. Goal trough 15-20.   Height: 5' 4"  (162.6 cm) Weight: 54.4 kg (120 lb) IBW/kg (Calculated) : 59.2  Temp (24hrs), Avg:98 F (36.7 C), Min:98 F (36.7 C), Max:98 F (36.7 C)  Recent Labs  Lab 08/05/20 0030 08/05/20 0345  WBC 20.7*  --   CREATININE 0.45* 0.43*  LATICACIDVEN 4.8* 5.6*    Estimated Creatinine Clearance: 67.1 mL/min (A) (by C-G formula based on SCr of 0.43 mg/dL (L)).    Allergies  Allergen Reactions   Hydrochlorothiazide Other (See Comments)    Hyponatremia    Antimicrobials this admission: 10/3 cefepime >>  10/3 vancomycin >>   Dose adjustments this admission: None  Microbiology results: 10/3 BCx: pending 10/3 UCx: pending    Thank you for allowing pharmacy to be a part of this patients care.  Oswald Hillock 08/05/2020 7:58 AM

## 2020-08-05 NOTE — ED Notes (Signed)
Pt to Ct

## 2020-08-05 NOTE — ED Notes (Signed)
Pharmacy called to retime potassium phospate due to difficulty obtaining 2nd IV access. Will request an IV team consult. MD aware.
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
## 2020-08-05 NOTE — ED Notes (Signed)
Report given to Rachel RN

## 2020-08-05 NOTE — ED Notes (Signed)
Repeat Lactic after LR done

## 2020-08-05 NOTE — ED Notes (Signed)
aprox last alcohol drink @ 7pm . Total 12 beers for the day

## 2020-08-05 NOTE — TOC Initial Note (Signed)
Transition of Care (TOC) - Initial/Assessment Note    Patient Details  Name: Douglas Peterson. MRN: [id number] Date of Birth: Jun 23, 1951  Transition of Care [facility]) CM/SW Contact:    Berenice Bouton, LCSW Phone Number: 08/05/2020, 11:40 AM  Clinical Narrative:  The patient is a 69 year old Caucasian male who presented to [facility] emergency on 10/3. Admitted with sepsis.  TOC CM/SW consult received for SNF.  Social worker discussed with patient SNF process. Phone call made to the patient's daughter, Effie Berkshire [phone]. Patient's daughter lives outside of [place].    Note: Patient was discharged from [facility] 1 week ago.   CMS Medicare.gov Compare Post Acute Care list provided to: Patient Choice offered to / list presented to : Patient and family            Expected Discharge Plan: [place] Barriers to Discharge: Continued Medical Work up   Patient Goals and CMS Choice Patient states their goals for this hospitalization and ongoing recovery are:: Patient stated that he wants to go home. CMS Medicare.gov Compare Post Acute Care list provided to:: Patient Choice offered to / list presented to : Patient  Expected Discharge Plan and Services Expected Discharge Plan: [place] Choice: [place]                                        Prior Living Arrangements/Services   Lives with:: Self Patient language and need for interpreter reviewed:: No        Need for Family Participation in Patient Care: Yes (Comment) (This patient lives alone. Continues to Drink alcohol)     Criminal Activity/Legal Involvement Pertinent to Current Situation/Hospitalization: No - Comment as needed  Activities of Daily Living      Permission Sought/Granted Permission sought to share information with : Case Manager, Customer service manager Permission granted to share information with : Yes, Verbal  Permission Granted  Share Information with NAME: [name] [phone]  Permission granted to share info w AGENCY: [facility]        Emotional Assessment Appearance:: Appears stated age Attitude/Demeanor/Rapport: Engaged Affect (typically observed): Calm Orientation: : Oriented to Self, Oriented to Place, Oriented to  Time, Oriented to Situation Alcohol / Substance Use: Alcohol Use Psych Involvement: No (comment)  Admission diagnosis:  Sepsis ([place]) [A41.9] SIRS (systemic inflammatory response syndrome) ([place]) [R65.10] Patient Active Problem List   Diagnosis Date Noted  . SIRS (systemic inflammatory response syndrome) ([place]) 08/05/2020  . Right hip pain 08/05/2020  . Closed fracture of distal clavicle_Right 06/05/2020  . GERD (gastroesophageal reflux disease) 06/05/2020  . Alcohol use disorder, severe, in early remission, dependence ([place]) 05/28/2020  . Acquired thrombophilia ([place])   . Elevated LFTs 04/06/2020  . Fall 04/06/2020  . Rhabdomyolysis 04/06/2020  . Elevated lactic acid level 04/06/2020  . Sepsis ([place]) 03/17/2020  . Constipation 03/17/2020  . Paroxysmal A-fib ([place]) 03/17/2020  . Severe protein-calorie malnutrition ([place]) 02/25/2020  . Left inguinal hernia 01/31/2020  . Encounter for competency evaluation   . Depression 01/17/2020  . Esophagitis   . Nausea without vomiting   . Weakness   . Tremor   . Pancytopenia ([place])   . Hypomagnesemia   . Alcoholic cirrhosis of liver without ascites ([place])   . Thrombocytopenia ([place])   . Non-traumatic rhabdomyolysis   . Alcohol withdrawal delirium ([place])  12/08/2019  . ETOH abuse   . Alcoholic intoxication with complication ([place])   . 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 Hypokalemia 09/27/2019  . Dysphagia 09/27/2019  . Alcohol withdrawal delirium, acute, hyperactive ([place]) 09/21/2019  . Pressure injury of ankle, stage 1 08/15/2019  . Goals of care, counseling/discussion   .  Palliative care by specialist   . Alcohol withdrawal ([place]) 03/20/2019  . Low back pain 11/15/2018  . Acute metabolic encephalopathy 16/43/5391  . Delirium tremens ([place]) 03/08/2018  . Malnutrition of moderate degree 11/24/2017  . HTN (hypertension) 09/16/2017  . Hypothermia 12/17/2016  . Alcohol use   . Diarrhea   . Hyponatremia 07/09/2015   PCP:  [name], MD Pharmacy:   [facility] [address], [place] [address] [place] [address] Phone: [phone] Fax: [phone]     Social Determinants of Health (SDOH) Interventions    Readmission Risk Interventions Readmission Risk Prevention Plan 05/19/2020 12/13/2019 12/12/2019  Transportation Screening Complete Complete Complete  PCP or Specialist Appt within 3-5 Days - (No Data) -  HRI or [place] - (No Data) -  Palliative Care Screening - Complete Complete  Medication Review (RN Care Manager) Complete Complete Complete  PCP or Specialist appointment within 3-5 days of discharge Complete - -  [place] or Home Care Consult Complete - -  SW Recovery Care/Counseling Consult Complete - -  Palliative Care Screening Not Applicable - -  [place] Not Applicable - -  Some recent data might be hidden

## 2020-08-05 NOTE — ED Notes (Signed)
[name] calm and cooperative. Meal tray at bedside.

## 2020-08-05 NOTE — ED Notes (Signed)
Pt taken to the [facility] room, Pt given shower, change of clothes.

## 2020-08-05 NOTE — Progress Notes (Signed)
PT Cancellation Note  Patient Details Name: Douglas Peterson. MRN: [id number] DOB: [birth year]   Cancelled Treatment:    Reason Eval/Treat Not Completed: Patient at procedure or test/unavailable. Patient not in the room at this time. PT will continue with attempts as appropriate.   Minna Merritts, PT, MPT  Percell Locus 08/05/2020, 3:16 PM

## 2020-08-05 NOTE — Progress Notes (Signed)
CODE SEPSIS - PHARMACY COMMUNICATION  **Broad Spectrum Antibiotics should be administered within 1 hour of Sepsis diagnosis**  Time Code Sepsis Called/Page Received: @ 0802  Antibiotics Ordered: Cefepime and vancomycin   Time of 1st antibiotic administration: @ 1916  Additional action taken by pharmacy: N/A  If necessary, Name of Provider/Nurse Contacted: N/A   Pernell Dupre, PharmD, [place] Pharmacist 08/05/2020 8:24 AM
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
## 2020-08-05 NOTE — Progress Notes (Signed)
OT Cancellation Note  Patient Details Name: Douglas Peterson. MRN: [id number] DOB: 1951-03-01   Cancelled Treatment:    Reason Eval/Treat Not Completed: Patient at procedure or test/ unavailable. Upon arrival, pt noted to be out of room at this time. Will continue to follow and initiate services at later date/time as able.   Dessie Coma, M.S. OTR/L  08/05/20, 3:26 PM  ascom [phone]

## 2020-08-06 DIAGNOSIS — R6510 Systemic inflammatory response syndrome (SIRS) of non-infectious origin without acute organ dysfunction: Secondary | ICD-10-CM | POA: Diagnosis not present

## 2020-08-06 LAB — COMPREHENSIVE METABOLIC PANEL
ALT: 38 U/L (ref 0–44)
AST: 57 U/L — ABNORMAL HIGH (ref 15–41)
Albumin: 4.3 g/dL (ref 3.5–5.0)
Alkaline Phosphatase: 100 U/L (ref 38–126)
Anion gap: 15 (ref 5–15)
BUN: 7 mg/dL — ABNORMAL LOW (ref 8–23)
CO2: 24 mmol/L (ref 22–32)
Calcium: 9.3 mg/dL (ref 8.9–10.3)
Chloride: 99 mmol/L (ref 98–111)
Creatinine, Ser: 0.44 mg/dL — ABNORMAL LOW (ref 0.61–1.24)
GFR calc Af Amer: >60 mL/min (ref >60)
GFR calc non Af Amer: >60 mL/min (ref >60)
Glucose, Bld: 94 mg/dL (ref 70–99)
Potassium: 3.8 mmol/L (ref 3.5–5.1)
Sodium: 138 mmol/L (ref 135–145)
Total Bilirubin: 1.8 mg/dL — ABNORMAL HIGH (ref 0.3–1.2)
Total Protein: 7.6 g/dL (ref 6.5–8.1)

## 2020-08-06 LAB — PHOSPHORUS: Phosphorus: 3.4 mg/dL (ref 2.5–4.6)

## 2020-08-06 LAB — URINE CULTURE

## 2020-08-06 LAB — GLUCOSE, CAPILLARY: Glucose-Capillary: 99 mg/dL (ref 70–99)

## 2020-08-06 LAB — CBC
HCT: 43.5 % (ref 39.0–52.0)
Hemoglobin: 15.3 g/dL (ref 13.0–17.0)
MCH: 30.1 pg (ref 26.0–34.0)
MCHC: 35.2 g/dL (ref 30.0–36.0)
MCV: 85.6 fL (ref 80.0–100.0)
Platelets: 106 10*3/uL — ABNORMAL LOW (ref 150–400)
RBC: 5.08 MIL/uL (ref 4.22–5.81)
RDW: 13.1 % (ref 11.5–15.5)
WBC: 6.5 10*3/uL (ref 4.0–10.5)
nRBC: 0 % (ref 0.0–0.2)

## 2020-08-06 LAB — MAGNESIUM: Magnesium: 1.9 mg/dL (ref 1.7–2.4)

## 2020-08-06 LAB — LACTIC ACID, PLASMA: Lactic Acid, Venous: 1.2 mmol/L (ref 0.5–1.9)

## 2020-08-06 LAB — TSH: TSH: 4.467 u[IU]/mL (ref 0.350–4.500)

## 2020-08-06 MED ORDER — BACLOFEN 10 MG PO TABS
10.0000 mg | ORAL_TABLET | Freq: Three times a day (TID) | ORAL | Status: DC
  Administered 2020-08-06 – 2020-08-14 (×23): 10 mg via ORAL
  Filled 2020-08-06 (×27): qty 1

## 2020-08-06 NOTE — ED Notes (Signed)
Pt linens and brief changed from urine incontinence

## 2020-08-06 NOTE — Evaluation (Signed)
Occupational Therapy Evaluation Patient Details Name: Douglas Peterson. MRN: [id number] DOB: [birth year] Today's Date: 08/06/2020    History of Present Illness Douglas Peterson. is a [phone] c PMH: HTN, GERD, depression, PAF, ETOH abuse, remote CVA c chronic Rt spastic hemiplegia, recent clavicular fracture (in sling (NWB), who comes to [facility] on 10/3 c generalized weakness and diarrhea.   Clinical Impression   Douglas Peterson was seen for OT evaluation this date. Prior to hospital admission, pt was recently d/c home alone from SNF. Pt previously required MOD I for mobility using SPC. Pt presents to acute OT demonstrating impaired ADL performance and functional mobility 2/2 decreased safety awareness, functional strength/ROM/balance deficits, and decreased activity tolerance. Pt disoriented to place/time. Pt currently requires MOD A don/doff gown at bed level. MOD A to exit R side of bed. MOD A x2 sit<>stand c B knee block - assist for return to high stretcher height. MAX A x2 don/doff brief in standing. SETUP self-feeding c LUE at bed level. Pt would benefit from skilled OT to address noted impairments and functional limitations (see below for any additional details) in order to maximize safety and independence while minimizing falls risk and caregiver burden. Upon hospital discharge, recommend STR to maximize pt safety and return to PLOF.     Follow Up Recommendations  SNF;Supervision/Assistance - 24 hour    Equipment Recommendations  Other (comment) (TBD next venue)    Recommendations for Other Services       Precautions / Restrictions Precautions Precautions: Fall Precaution Comments: RUE sling Restrictions Weight Bearing Restrictions: No Other Position/Activity Restrictions: Rt clavicle fx on 06/05/20      Mobility Bed Mobility Overal bed mobility: Needs Assistance Bed Mobility: Supine to Sit;Sit to Supine     Supine to sit: Mod assist;HOB elevated Sit to supine: Mod assist;+2 for  physical assistance   General bed mobility comments: assist for BLE mgmt and trunk support  Transfers Overall transfer level: Needs assistance Equipment used: 1 person hand held assist Transfers: Sit to/from Stand Sit to Stand: Mod assist;From elevated surface      General transfer comment: B knee block     Balance Overall balance assessment: Needs assistance Sitting-balance support: No upper extremity supported;Feet unsupported Sitting balance-Leahy Scale: Fair     Standing balance support: Single extremity supported Standing balance-Leahy Scale: Poor Standing balance comment: mild posterior LOBs noted               ADL either performed or assessed with clinical judgement   ADL Overall ADL's : Needs assistance/impaired           General ADL Comments: MOD A don/doff gown at bed level. MAX A x2 don/doff brief in standing. MOD A x2 for ADL t/f. SETUP self-feeding c LUE at bed level                  Pertinent Vitals/Pain Pain Assessment: No/denies pain Pain Intervention(s): Monitored during session     Hand Dominance Left   Extremity/Trunk Assessment Upper Extremity Assessment Upper Extremity Assessment: RUE deficits/detail;LUE deficits/detail RUE Deficits / Details: chronic RUE spastic hemiplegia. Recent clavicular fx ~8wks ago LUE Deficits / Details: AROM grossly WFL   Lower Extremity Assessment Lower Extremity Assessment: Generalized weakness RLE Deficits / Details: Chronic spastic hemiplegia       Communication Communication Communication: No difficulties   Cognition Arousal/Alertness: Awake/alert Behavior During Therapy: WFL for tasks assessed/performed Overall Cognitive Status: Within Functional Limits for tasks assessed  General Comments: Disoriented to place asking is we are in [place], able to state location as hospital from categorical list   General Comments       Exercises Exercises: Other exercises Other  Exercises Other Exercises: Pt educated re: OT role, DME recs, d/c recs, falls prevention, ECS Other Exercises: UBD, perihygiene, sup<>sit, sit<>stand, sitting/standing balance/tolerance, bed mobility        Home Living Family/patient expects to be discharged to:: Private residence Living Arrangements: Alone Available Help at Discharge: Family;Available PRN/intermittently Type of Home: House Home Access: Stairs to enter [employer] of Steps: 3 with no rails (from garage) or 5 with bilateral rails (from front entrance) Entrance Stairs-Rails: Right;Left;Can reach both Home Layout: One level     Bathroom Shower/Tub: Teacher, early years/pre: Standard     Home Equipment: Cane - single point;Cane - quad;Walker - 2 wheels;Tub bench;Grab bars - tub/shower;Shower seat;Grab bars - toilet   Additional Comments: RUE Resting hand splint - did use it in past - but not recently per pt      Prior Functioning/Environment Level of Independence: Independent with assistive device(s)        Comments: Pt recently d/c from SNF. Ambulates c SPC; h/o falls        OT Problem List: Decreased strength;Decreased range of motion;Decreased activity tolerance;Impaired balance (sitting and/or standing);Decreased safety awareness      OT Treatment/Interventions: Self-care/ADL training;Therapeutic exercise;Energy conservation;DME and/or AE instruction;Therapeutic activities;Patient/family education;Balance training    OT Goals(Current goals can be found in the care plan section) Acute Rehab OT Goals Patient Stated Goal: To go home OT Goal Formulation: With patient Time For Goal Achievement: 08/20/20 Potential to Achieve Goals: Fair ADL Goals Pt Will Perform Grooming: with min assist;standing;with set-up (c LRAD PRN) Pt Will Perform Lower Body Dressing: with mod assist;sit to/from stand (c LRAD PRN) Pt Will Transfer to Toilet: with min assist;ambulating;bedside commode (c LRAD PRN)   OT Frequency: Min 1X/week   Barriers to D/C: Decreased caregiver support             AM-PAC OT "6 Clicks" Daily Activity     Outcome Measure Help from another person eating meals?: None Help from another person taking care of personal grooming?: A Little Help from another person toileting, which includes using toliet, bedpan, or urinal?: A Lot Help from another person bathing (including washing, rinsing, drying)?: A Lot Help from another person to put on and taking off regular upper body clothing?: A Lot Help from another person to put on and taking off regular lower body clothing?: A Lot 6 Click Score: 15   End of Session Nurse Communication: Mobility status  Activity Tolerance: Patient tolerated treatment well Patient left: in bed;with call bell/phone within reach  OT Visit Diagnosis: Other abnormalities of gait and mobility (R26.89);Muscle weakness (generalized) (M62.81);History of falling (Z91.81);Repeated falls (R29.6)                Time: 6808-8110 OT Time Calculation (min): 13 min Charges:  OT General Charges $OT Visit: 1 Visit OT Evaluation $OT Eval Low Complexity: 1 Low OT Treatments $Self Care/Home Management : 8-22 mins  Dessie Coma, M.S. OTR/L  08/06/20, 3:35 PM  ascom [phone]

## 2020-08-06 NOTE — ED Notes (Signed)
Pharmacy contacted for missing vanc

## 2020-08-06 NOTE — Progress Notes (Addendum)
PROGRESS NOTE    Douglas Peterson.  ZOX:[id number] DOB: [birth year] DOA: 08/05/2020 PCP: Douglas Kayser, MD    Brief Narrative:  Douglas Peterson. is a 69 y.o. male with medical history significant of hypertension, GERD, depression, PAF, alcohol abuse, chronic right-sided weakness, generalized weakness, diarrhea.[name] a heavily drinker.   10/4- pt is confused.  Consultants:     Procedures:  CT-abdomen/pelvis: 1. No acute abnormality. No evidence of bowel obstruction or acute bowel inflammation. Chronic moderate left inguinal hernia contains a portion of the sigmoid colon, unchanged, without acute bowel complication. 2. Diffuse hepatic steatosis. Question fine liver surface irregularity, cannot exclude cirrhosis. No liver masses. 3. Chronic circumferential wall thickening in the lower thoracic esophagus, nonspecific, most commonly due to esophagitis. 4. Healing anterior right fifth and sixth rib fractures. 5. Mild prostatomegaly. 6. Aortic Atherosclerosis (ICD10-I70.0).   Antimicrobials:   vanco and cefepime.    Subjective: Confused, has no complaints.   Objective: Vitals:   08/06/20 1241 08/06/20 1304 08/06/20 1305 08/06/20 1422  BP: (!) 141/75 138/67 138/67 138/67  Pulse: (!) 58 (!) 56 60 68  Resp:   17   Temp:   97.8 F (36.6 C)   TempSrc:   Oral   SpO2:   94%   Weight:      Height:        Intake/Output Summary (Last 24 hours) at 08/06/2020 1634 Last data filed at 08/06/2020 1407 Gross per 24 hour  Intake 291.28 ml  Output --  Net 291.28 ml   Filed Weights   08/05/20 0026  Weight: 54.4 kg    Examination:  General exam: Appears calm and comfortable  Respiratory system: Clear to auscultation. Respiratory effort normal. Cardiovascular system: S1 & S2 heard, RRR. No JVD, murmurs, rubs, gallops or clicks.  Gastrointestinal system: Abdomen is nondistended, soft and nontender. Normal bowel sounds heard. Central nervous system: confused, grossly  intact Extremities: no edema Skin: warm, dry Psychiatry: difficult to assess     Data Reviewed: I have personally reviewed following labs and imaging studies  CBC: Recent Labs  Lab 08/05/20 0030 08/06/20 0718  WBC 20.7* 6.5  NEUTROABS 8.1*  --   HGB 16.5 15.3  HCT 46.7 43.5  MCV 84.9 85.6  PLT 232 811*   Basic Metabolic Panel: Recent Labs  Lab 08/05/20 0030 08/05/20 0345 08/05/20 0746 08/06/20 0618  NA 129* 135 137 138  K 4.0 4.3 3.4* 3.8  CL 87* 97* 97* 99  CO2 28 28 30 24   GLUCOSE 106* 88 100* 94  BUN <5* <5* <5* 7*  CREATININE 0.45* 0.43* 0.40* 0.44*  CALCIUM 8.7* 8.3* 8.4* 9.3  MG  --   --   --  1.9  PHOS  --   --  1.4* 3.4   GFR: Estimated Creatinine Clearance: 67.1 mL/min (A) (by C-G formula based on SCr of 0.44 mg/dL (L)). Liver Function Tests: Recent Labs  Lab 08/05/20 0030 08/05/20 0746 08/06/20 0618  AST 61* 42* 57*  ALT 40 29 38  ALKPHOS 103 84 100  BILITOT 1.0 1.0 1.8*  PROT 8.3* 6.1* 7.6  ALBUMIN 4.7 3.5 4.3   Recent Labs  Lab 08/05/20 0030  LIPASE 23   No results for input(s): AMMONIA in the last 168 hours. Coagulation Profile: Recent Labs  Lab 08/05/20 1242  INR 1.0   Cardiac Enzymes: Recent Labs  Lab 08/05/20 0746  CKTOTAL 364   BNP (last 3 results) No results for input(s): PROBNP in the last 8760 hours.  HbA1C: No results for input(s): HGBA1C in the last 72 hours. CBG: Recent Labs  Lab 08/06/20 0824  GLUCAP 99   Lipid Profile: No results for input(s): CHOL, HDL, LDLCALC, TRIG, CHOLHDL, LDLDIRECT in the last 72 hours. Thyroid Function Tests: Recent Labs    08/06/20 0618  TSH 4.467   Anemia Panel: No results for input(s): VITAMINB12, FOLATE, FERRITIN, TIBC, IRON, RETICCTPCT in the last 72 hours. Sepsis Labs: Recent Labs  Lab 08/05/20 0345 08/05/20 1242 08/05/20 1712 08/06/20 0718  PROCALCITON <0.10  --   --   --   LATICACIDVEN 5.6* 2.2* 1.5 1.2    Recent Results (from the past 240 hour(s))   Respiratory Panel by RT PCR (Flu A&B, Covid) - Nasopharyngeal Swab     Status: None   Collection Time: 08/05/20 12:30 AM   Specimen: Nasopharyngeal Swab  Result Value Ref Range Status   SARS Coronavirus 2 by RT PCR NEGATIVE NEGATIVE Final    Comment: (NOTE) SARS-CoV-2 target nucleic acids are NOT DETECTED.  The SARS-CoV-2 RNA is generally detectable in upper respiratoy specimens during the acute phase of infection. The lowest concentration of SARS-CoV-2 viral copies this assay can detect is 131 copies/mL. A negative result does not preclude SARS-Cov-2 infection and should not be used as the sole basis for treatment or other patient management decisions. A negative result may occur with  improper specimen collection/handling, submission of specimen other than nasopharyngeal swab, presence of viral mutation(s) within the areas targeted by this assay, and inadequate number of viral copies (<131 copies/mL). A negative result must be combined with clinical observations, patient history, and epidemiological information. The expected result is Negative.  Fact Sheet for Patients:  PinkCheek.be  Fact Sheet for Healthcare Providers:  GravelBags.it  This test is no t yet approved or cleared by the [place] FDA and  has been authorized for detection and/or diagnosis of SARS-CoV-2 by FDA under an Emergency Use Authorization (EUA). This EUA will remain  in effect (meaning this test can be used) for the duration of the COVID-19 declaration under Section 564(b)(1) of the Act, 21 U.S.C. section 360bbb-3(b)(1), unless the authorization is terminated or revoked sooner.     Influenza A by PCR NEGATIVE NEGATIVE Final   Influenza B by PCR NEGATIVE NEGATIVE Final    Comment: (NOTE) The Xpert Xpress SARS-CoV-2/FLU/RSV assay is intended as an aid in  the diagnosis of influenza from Nasopharyngeal swab specimens and  should not be used as  a sole basis for treatment. Nasal washings and  aspirates are unacceptable for Xpert Xpress SARS-CoV-2/FLU/RSV  testing.  Fact Sheet for Patients: PinkCheek.be  Fact Sheet for Healthcare Providers: GravelBags.it  This test is not yet approved or cleared by the [place] FDA and  has been authorized for detection and/or diagnosis of SARS-CoV-2 by  FDA under an Emergency Use Authorization (EUA). This EUA will remain  in effect (meaning this test can be used) for the duration of the  Covid-19 declaration under Section 564(b)(1) of the Act, 21  U.S.C. section 360bbb-3(b)(1), unless the authorization is  terminated or revoked. Performed at [facility], [address] [place], [place] [address]   Urine culture     Status: Abnormal   Collection Time: 08/05/20 12:30 AM   Specimen: In/Out Cath Urine  Result Value Ref Range Status   Specimen Description   Final    IN/OUT CATH URINE Performed at [facility], [address], [place], [place] [address]    Special Requests  Final    NONE Performed at [facility], [address] [place], [place] [address]    Culture MULTIPLE SPECIES PRESENT, SUGGEST RECOLLECTION (A)  Final   Report Status 08/06/2020 FINAL  Final  Blood culture (routine x 2)     Status: None (Preliminary result)   Collection Time: 08/05/20  4:39 AM   Specimen: BLOOD  Result Value Ref Range Status   Specimen Description BLOOD LEFT ANTECUBITAL  Final   Special Requests   Final    BOTTLES DRAWN AEROBIC AND ANAEROBIC Blood Culture adequate volume   Culture   Final    NO GROWTH 1 DAY Performed at [facility], [address], [place], [place] [address]    Report Status PENDING  Incomplete  Blood culture (routine x 2)     Status: None (Preliminary result)   Collection Time: 08/05/20  4:39 AM   Specimen: BLOOD  Result Value Ref Range Status   Specimen Description  BLOOD BLOOD LEFT HAND  Final   Special Requests   Final    BOTTLES DRAWN AEROBIC AND ANAEROBIC Blood Culture results may not be optimal due to an inadequate volume of blood received in culture bottles   Culture   Final    NO GROWTH 1 DAY Performed at [facility], [address], [place], [place] [address]    Report Status PENDING  Incomplete         Radiology Studies: CT ABDOMEN PELVIS W CONTRAST  Result Date: 08/05/2020 CLINICAL DATA:  Abdominal pain, alcohol intoxication. EXAM: CT ABDOMEN AND PELVIS WITH CONTRAST TECHNIQUE: Multidetector CT imaging of the abdomen and pelvis was performed using the standard protocol following bolus administration of intravenous contrast. CONTRAST:  119m OMNIPAQUE IOHEXOL 300 MG/ML  SOLN COMPARISON:  03/17/2020 CT abdomen/pelvis. FINDINGS: Lower chest: Mild platelike scarring versus atelectasis at the right lung base. Symmetric mild bilateral gynecomastia is unchanged. Chronic circumferential wall thickening in the lower thoracic esophagus appears unchanged. Hepatobiliary: Diffuse hepatic steatosis. Question fine liver surface irregularity, cannot exclude cirrhosis. No liver masses. Normal gallbladder with no radiopaque cholelithiasis. No biliary ductal dilatation. Pancreas: Normal, with no mass or duct dilation. Spleen: Normal size. No mass. Adrenals/Urinary Tract: Normal adrenals. No hydronephrosis. Subcentimeter hypodense renal cortical lesion in the posterior lower left kidney is stable and considered benign. No suspicious renal masses. Normal bladder. Stomach/Bowel: Normal non-distended stomach. Normal caliber small bowel with no small bowel wall thickening. Appendectomy. Moderate left inguinal hernia contains a portion of the sigmoid colon, unchanged. No large bowel wall thickening, significant diverticulosis, caliber transition, pneumatosis or acute pericolonic fat stranding. Vascular/Lymphatic: Atherosclerotic nonaneurysmal abdominal aorta.  Patent portal, splenic, hepatic and renal veins. No pathologically enlarged lymph nodes in the abdomen or pelvis. Reproductive: Mild prostatomegaly. Other: No pneumoperitoneum, ascites or focal fluid collection. Musculoskeletal: No aggressive appearing focal osseous lesions. Healing anterior right fifth and sixth rib fractures. Chronic moderate L2 vertebral compression fracture status post vertebroplasty. Chronic severe L1 vertebral compression fracture. Chronic mild superior T12 vertebral compression fracture. Chronic mild to moderate T8 vertebral compression fracture. Moderate thoracolumbar spondylosis. IMPRESSION: 1. No acute abnormality. No evidence of bowel obstruction or acute bowel inflammation. Chronic moderate left inguinal hernia contains a portion of the sigmoid colon, unchanged, without acute bowel complication. 2. Diffuse hepatic steatosis. Question fine liver surface irregularity, cannot exclude cirrhosis. No liver masses. 3. Chronic circumferential wall thickening in the lower thoracic esophagus, nonspecific, most commonly due to esophagitis. 4. Healing anterior right fifth and sixth rib fractures. 5. Mild  prostatomegaly. 6. Aortic Atherosclerosis (ICD10-I70.0). Electronically Signed   By: Ilona Sorrel M.D.   On: 08/05/2020 05:25   DG Chest Portable 1 View  Result Date: 08/05/2020 CLINICAL DATA:  Cough, alcohol EXAM: PORTABLE CHEST 1 VIEW COMPARISON:  June 08, 2020 FINDINGS: The heart size and mediastinal contours are within normal limits. Both lungs are clear. The visualized skeletal structures are unremarkable. IMPRESSION: No active disease. Electronically Signed   By: Prudencio Pair M.D.   On: 08/05/2020 01:14   DG HIP UNILAT WITH PELVIS 2-3 VIEWS RIGHT  Result Date: 08/05/2020 CLINICAL DATA:  RIGHT hip pain after fall EXAM: DG HIP (WITH OR WITHOUT PELVIS) 2-3V RIGHT COMPARISON:  July 06, 2020, August 05, 2020 FINDINGS: Osteopenia. No acute fracture or dislocation. Mild degenerative  changes of the RIGHT hip with joint space narrowing. Status post hernia repair. No area of erosion or osseous destruction. Excreted contrast within the bladder. Pelvic phleboliths. Vascular calcifications. Included view of the contralateral hip is unremarkable. IMPRESSION: Mild degenerative changes of the RIGHT hip. No acute fracture or dislocation. If persistent concern for nondisplaced hip or pelvic fracture, recommend dedicated pelvic MRI. Electronically Signed   By: Valentino Saxon MD   On: 08/05/2020 09:04        Scheduled Meds: . vitamin C  1,000 mg Oral Daily  . aspirin EC  81 mg Oral Daily  . atenolol  25 mg Oral BID  . baclofen  10 mg Oral TID  . calcium-vitamin D  1 tablet Oral Q breakfast  . chlordiazePOXIDE  25 mg Oral TID  . cholecalciferol  5,000 Units Oral Daily  . enoxaparin (LOVENOX) injection  40 mg Subcutaneous Q24H  . feeding supplement (ENSURE ENLIVE)  237 mL Oral BID BM  . folic acid  1 mg Oral Daily  . LORazepam  0-4 mg Intravenous Q6H   Followed by  . [START ON 08/07/2020] LORazepam  0-4 mg Intravenous Q12H  . multivitamin with minerals  1 tablet Oral Daily  . pantoprazole  40 mg Oral BID AC  . PARoxetine  30 mg Oral Daily  . thiamine  100 mg Oral Daily   Or  . thiamine  100 mg Intravenous Daily   Continuous Infusions: . ceFEPime (MAXIPIME) IV Stopped (08/06/20 1505)  . dextrose 75 mL/hr at 08/05/20 1024  . vancomycin Stopped (08/06/20 1404)    Assessment & Plan:   Principal Problem:   SIRS (systemic inflammatory response syndrome) (HCC) Active Problems:   Hyponatremia   Alcohol use   Diarrhea   HTN (hypertension)   Malnutrition of moderate degree   Depression   Sepsis (HCC)   Paroxysmal A-fib (HCC)   GERD (gastroesophageal reflux disease)   Right hip pain   Hypophosphatemia  SIRS/sepsis -Patient meets criteria for SIRS with leukocytosis with WBC 20.7, tachycardia with heart rate of 116, tachypnea with RR 22. Lactic acid 4.8 -->5.6.on  admission Continue to monitor lactic acid Leukocytosis improved UA negative Chest x-ray negative 1 blood culture gram-negative rod Continue with vancomycin and cefepime until final culture results   Right hip pain:X-ray of right hip negative for fracture, but it showed degenerative.  Pain med as needed PT OT pending Will likely need placement   Hyponatremia: Post likely due to alcohol abuse, poor oral intake and dehydration and potomania. Initial Na is 129 at 00:30 AM which increased to 135 at 3:45 AM --> 137 at 7:46 AM after given 3L LR bolus in ED, which is too fast in correction.  10/4 -  Improved , today Na 138. Need to monitor neurostatus closely Decrease ivf rate Monitor levels closely   Alcohol use: -scored 2 this am On CIWA protocal Continue thiamine and folic acid  Diarrhea: Stool c.diff negative  Altered MS:pt appears to be confused for me. ? Withdrawal, will have nsg reassess. Also one bcx GM neg. Rod, final pending, will f/u Continue with iv vanco/cefepime   HTN (hypertension) Stable , continue with atenalol  Malnutrition of moderate degree -Continue nutrition supplement  Depression -continue paxil  Paroxysmal A-fib ([place]): not on chronic anticoagulants at home. -continue atenolol  GERD (gastroesophageal reflux disease): -Protonix 40 mg bid   DVT prophylaxis: lovenox Code Status:full Family Communication: none at bedside  Status is: Inpatient  Remains inpatient appropriate because:IV treatments appropriate due to intensity of illness or inability to take PO   Dispo: The patient is from: Home              Anticipated d/c is to: snf v.s. home              Anticipated d/c date is: 3 days              Patient currently is not medically stable to d/c.altered MS, bcx pending as one is GM neg. ROD, on iv abx.            LOS: 1 day   Time spent:35 min with >50% on coc    Nolberto Hanlon, MD [facility] Pager [phone]  [phone]  If 7PM-7AM, please contact night-coverage [email or web address] Password [facility] 08/06/2020, 4:34 PM

## 2020-08-06 NOTE — ED Notes (Signed)
Meal tray at bedside.  

## 2020-08-06 NOTE — Evaluation (Signed)
Physical Therapy Evaluation Patient Details Name: Douglas Peterson. MRN: [id number] DOB: 01-Oct-1951 Today's Date: 08/06/2020   History of Present Illness  Douglas Peterson. is a [phone] c PMH: HTN, GERD, depression, PAF, ETOH abuse, remote CVA c chronic Rt spastic hemiplegia, recent clavicular fracture (in sling (NWB), who comes to [facility] on 10/3 c generalized weakness and diarrhea.  Clinical Impression  Pt admitted with above diagnosis. Pt currently with functional limitations due to the deficits listed below (see "PT Problem List"). Upon entry, pt in bed, asleep, difficult to rouse. Pt intermittently verbally responsive, but delayed, and maintains eyes closed. MaxA supine to/from sitting EOB, but pt able to sit unsupported for 3-4 minutes. At EOB, pt maintains eyes closed, but has WNL postural sway and righting. OOB assessment is not appropriate at this time due to somnolence. Pt Functional mobility assessment demonstrates increased effort/time requirements, poor tolerance, and need for physical assistance, whereas the patient performed these at a higher level of independence PTA. Pt will benefit from skilled PT intervention to increase independence and safety with basic mobility in preparation for discharge to the venue listed below.       Follow Up Recommendations SNF;Supervision/Assistance - 24 hour    Equipment Recommendations  None recommended by PT    Recommendations for Other Services       Precautions / Restrictions Precautions Precautions: Fall Precaution Comments: RUE sling Restrictions Other Position/Activity Restrictions: Rt clavicle fx      Mobility  Bed Mobility Overal bed mobility: Needs Assistance Bed Mobility: Supine to Sit;Sit to Supine     Supine to sit: Max assist Sit to supine: Max assist   General bed mobility comments: able to sit at EOB feet unsupported and maintain trunk stability, but never really becomes alert  Transfers Overall transfer level:  (not  safe to attempt, pt lethargic)                  Ambulation/Gait                Stairs            Wheelchair Mobility    Modified Rankin (Stroke Patients Only)       Balance Overall balance assessment: Needs assistance Sitting-balance support: No upper extremity supported;Feet unsupported Sitting balance-Leahy Scale: Good         Standing balance comment: too seleepy to attempt                             Pertinent Vitals/Pain Pain Assessment: No/denies pain Pain Intervention(s): Monitored during session    Home Living Family/patient expects to be discharged to:: Private residence Living Arrangements: Alone Available Help at Discharge: Family;Available PRN/intermittently Type of Home: House Home Access: Stairs to enter Entrance Stairs-Rails: Right;Left;Can reach both Entrance Stairs-Number of Steps: 3 with no rails (from garage) or 5 with bilateral rails (from front entrance) Home Layout: One level Home Equipment: Cane - single point;Cane - quad;Walker - 2 wheels;Tub bench;Grab bars - tub/shower;Shower seat;Grab bars - toilet Additional Comments: RUE Resting hand splint - did use it in past - but not recently per pt    Prior Function Level of Independence: Independent with assistive device(s)         Comments: Modified independent ambulating with SPC limited community distances; h/o falls; (+) driving; modified independent with ADL's     Hand Dominance   Dominant Hand: Left    Extremity/Trunk Assessment   Upper Extremity  Assessment RUE Deficits / Details: chronic RUE spastic hemiplegia    Lower Extremity Assessment RLE Deficits / Details: Chronic spastic hemiplegia       Communication   Communication: No difficulties  Cognition Arousal/Alertness: Awake/alert Behavior During Therapy: WFL for tasks assessed/performed Overall Cognitive Status: Within Functional Limits for tasks assessed                                         General Comments      Exercises     Assessment/Plan    PT Assessment Patient needs continued PT services  PT Problem List Decreased strength;Decreased activity tolerance;Decreased balance;Decreased mobility       PT Treatment Interventions DME instruction;Gait training;Stair training;Functional mobility training;Therapeutic activities;Therapeutic exercise;Balance training;Patient/family education    PT Goals (Current goals can be found in the Care Plan section)  Acute Rehab PT Goals PT Goal Formulation: Patient unable to participate in goal setting Time For Goal Achievement: 08/20/20    Frequency Min 2X/week   Barriers to discharge Decreased caregiver support;Inaccessible home environment      Co-evaluation               AM-PAC PT "6 Clicks" Mobility  Outcome Measure Help needed turning from your back to your side while in a flat bed without using bedrails?: A Lot Help needed moving from lying on your back to sitting on the side of a flat bed without using bedrails?: A Lot Help needed moving to and from a bed to a chair (including a wheelchair)?: A Lot Help needed standing up from a chair using your arms (e.g., wheelchair or bedside chair)?: Total Help needed to walk in hospital room?: Total Help needed climbing 3-5 steps with a railing? : Total 6 Click Score: 9    End of Session   Activity Tolerance: Patient tolerated treatment well;Patient limited by lethargy Patient left: in bed;with call bell/phone within reach;with nursing/sitter in room Nurse Communication: Mobility status PT Visit Diagnosis: Unsteadiness on feet (R26.81);Other abnormalities of gait and mobility (R26.89);Repeated falls (R29.6);Muscle weakness (generalized) (M62.81);History of falling (Z91.81);Difficulty in walking, not elsewhere classified (R26.2)    Time: 8469-6295 PT Time Calculation (min) (ACUTE ONLY): 13 min   Charges:   PT Evaluation $PT Eval Moderate  Complexity: 1 Mod          11:42 AM, 08/06/20 Etta Grandchild, PT, DPT Physical Therapist - [facility]  [phone] ([place])    [place] C 08/06/2020, 11:36 AM

## 2020-08-06 NOTE — Progress Notes (Signed)
PHARMACY - PHYSICIAN COMMUNICATION CRITICAL VALUE ALERT - BLOOD CULTURE IDENTIFICATION (BCID)  Jw Covin. is an 69 y.o. male who presented to [facility] on 08/05/2020  Assessment:  1/4 bottles GPR, identification pending  Name of physician (or Provider) Contacted: Rufina Falco, NP  Current antibiotics: vancomycin, cefepime  Changes to prescribed antibiotics recommended: no changes, pending further work up  No results found for this or any previous visit.  Tawnya Crook, PharmD 08/06/2020  7:32 PM

## 2020-08-06 NOTE — ED Notes (Addendum)
Pt was non-compliant w/ safety requests, frequently attempting to leave stretcher, refused to allow male external catheter to remain in place and urinated all over self, stretcher and floor x 5 between 2300 and 0300, requiring cleaning and complete linen change x 5 episodes. Pt made threats to go to the police because he was being kidnapped. Pt medicated and a safety sitter placed to help redirect him to remain on the stretcher and allow IV medications to infuse.

## 2020-08-07 DIAGNOSIS — R6510 Systemic inflammatory response syndrome (SIRS) of non-infectious origin without acute organ dysfunction: Secondary | ICD-10-CM | POA: Diagnosis not present

## 2020-08-07 LAB — CBC
HCT: 38.6 % — ABNORMAL LOW (ref 39.0–52.0)
Hemoglobin: 13.9 g/dL (ref 13.0–17.0)
MCH: 31 pg (ref 26.0–34.0)
MCHC: 36 g/dL (ref 30.0–36.0)
MCV: 86 fL (ref 80.0–100.0)
Platelets: 103 10*3/uL — ABNORMAL LOW (ref 150–400)
RBC: 4.49 MIL/uL (ref 4.22–5.81)
RDW: 13.1 % (ref 11.5–15.5)
WBC: 6.1 10*3/uL (ref 4.0–10.5)
nRBC: 0 % (ref 0.0–0.2)

## 2020-08-07 LAB — BASIC METABOLIC PANEL
Anion gap: 8 (ref 5–15)
BUN: 7 mg/dL — ABNORMAL LOW (ref 8–23)
CO2: 28 mmol/L (ref 22–32)
Calcium: 8.5 mg/dL — ABNORMAL LOW (ref 8.9–10.3)
Chloride: 100 mmol/L (ref 98–111)
Creatinine, Ser: 0.42 mg/dL — ABNORMAL LOW (ref 0.61–1.24)
GFR calc Af Amer: >60 mL/min (ref >60)
GFR calc non Af Amer: >60 mL/min (ref >60)
Glucose, Bld: 108 mg/dL — ABNORMAL HIGH (ref 70–99)
Potassium: 3.4 mmol/L — ABNORMAL LOW (ref 3.5–5.1)
Sodium: 136 mmol/L (ref 135–145)

## 2020-08-07 MED ORDER — RISAQUAD PO CAPS
2.0000 | ORAL_CAPSULE | Freq: Three times a day (TID) | ORAL | Status: DC
  Administered 2020-08-08 – 2020-08-14 (×19): 2 via ORAL
  Filled 2020-08-07 (×13): qty 2
  Filled 2020-08-07: qty 1
  Filled 2020-08-07 (×6): qty 2

## 2020-08-07 MED ORDER — BACID PO TABS
2.0000 | ORAL_TABLET | Freq: Three times a day (TID) | ORAL | Status: DC
  Filled 2020-08-07 (×3): qty 2

## 2020-08-07 NOTE — TOC Progression Note (Signed)
Transition of Care (TOC) - Progression Note    Patient Details  Name: Douglas Peterson. MRN: [id number] Date of Birth: 26-Apr-1951  Transition of Care [facility]) CM/SW Contact  Candie Chroman, LCSW Phone Number: 08/07/2020, 10:46 AM  Clinical Narrative: Left voicemail for DSS social worker, [name], to see if they are still working with patient.    Expected Discharge Plan: Skilled Nursing Facility Barriers to Discharge: Continued Medical Work up  Expected Discharge Plan and Services Expected Discharge Plan: [place]     Post Acute Care Choice: [place]                                         Social Determinants of Health (SDOH) Interventions    Readmission Risk Interventions Readmission Risk Prevention Plan 05/19/2020 12/13/2019 12/12/2019  Transportation Screening Complete Complete Complete  PCP or Specialist Appt within 3-5 Days - (No Data) -  HRI or [place] - (No Data) -  Palliative Care Screening - Complete Complete  Medication Review (RN Care Manager) Complete Complete Complete  PCP or Specialist appointment within 3-5 days of discharge Complete - -  [place] or Home Care Consult Complete - -  SW Recovery Care/Counseling Consult Complete - -  Palliative Care Screening Not Applicable - -  [place] Not Applicable - -  Some recent data might be hidden

## 2020-08-07 NOTE — Progress Notes (Addendum)
PROGRESS NOTE    [name]  EZM:[id number] DOB: 10/02/1951 DOA: 08/05/2020 PCP: [name], MD    Brief Narrative:  Gayle Collard. is a 69 y.o. male with medical history significant of hypertension, GERD, depression, PAF, alcohol abuse, chronic right-sided weakness, generalized weakness, diarrhea.Pt continues to drink alcohol heavily on daily basis. Covid was negative. Toxicology was negative. Alcohol level on admission was 268.  Consultants:     Procedures:   CT-abdomen/pelvis: 1. No acute abnormality. No evidence of bowel obstruction or acute bowel inflammation. Chronic moderate left inguinal hernia contains a portion of the sigmoid colon, unchanged, without acute bowel complication. 2. Diffuse hepatic steatosis. Question fine liver surface irregularity, cannot exclude cirrhosis. No liver masses. 3. Chronic circumferential wall thickening in the lower thoracic esophagus, nonspecific, most commonly due to esophagitis. 4. Healing anterior right fifth and sixth rib fractures. 5. Mild prostatomegaly. 6. Aortic Atherosclerosis (ICD10-I70.0).  Antimicrobials:   Cefepime  Vancomycin Gram-negative rod in both aerobic and anaerobic bottle from the left antecubital. Final report pending  Subjective: Pt somewhat confused, unsure of the date or place, or how he got here.. Soft spoken. He reports to me that he doesn't remember anything . Denies sob, cp, abd pain  Objective: Vitals:   08/07/20 0418 08/07/20 0812 08/07/20 1141 08/07/20 1621  BP: 136/87 123/61 136/84 (!) 152/92  Pulse: 69 (!) 52 63 71  Resp: 18 18 18 17   Temp: 97.9 F (36.6 C) 98 F (36.7 C) 97.7 F (36.5 C) 97.6 F (36.4 C)  TempSrc: Oral Oral Oral Oral  SpO2: 94% 96% 96% 97%  Weight:      Height:        Intake/Output Summary (Last 24 hours) at 08/07/2020 1628 Last data filed at 08/07/2020 1300 Gross per 24 hour  Intake 1963.2 ml  Output 750 ml  Net 1213.2 ml   Filed Weights   08/05/20  0026 08/06/20 2205  Weight: 54.4 kg 55.7 kg    Examination:  General exam: Appears calm and comfortable , pale Respiratory system: Clear to auscultation. Respiratory effort normal. Cardiovascular system: S1 & S2 heard, RRR. No JVD, murmurs, rubs, gallops or clicks.  Gastrointestinal system: Abdomen is nondistended, soft and nontender. No Normal bowel sounds heard. Central nervous system: Alert , oriented only to person . Still confused. Grossly intact. Extremities: No edema. Skin: Warm dry Psychiatry: Mood and affect appropriate in current situation    Data Reviewed: I have personally reviewed following labs and imaging studies  CBC: Recent Labs  Lab 08/05/20 0030 08/06/20 0718 08/07/20 0528  WBC 20.7* 6.5 6.1  NEUTROABS 8.1*  --   --   HGB 16.5 15.3 13.9  HCT 46.7 43.5 38.6*  MCV 84.9 85.6 86.0  PLT 232 106* 503*   Basic Metabolic Panel: Recent Labs  Lab 08/05/20 0030 08/05/20 0345 08/05/20 0746 08/06/20 0618 08/07/20 0528  NA 129* 135 137 138 136  K 4.0 4.3 3.4* 3.8 3.4*  CL 87* 97* 97* 99 100  CO2 28 28 30 24 28   GLUCOSE 106* 88 100* 94 108*  BUN <5* <5* <5* 7* 7*  CREATININE 0.45* 0.43* 0.40* 0.44* 0.42*  CALCIUM 8.7* 8.3* 8.4* 9.3 8.5*  MG  --   --   --  1.9  --   PHOS  --   --  1.4* 3.4  --    GFR: Estimated Creatinine Clearance: 68.7 mL/min (A) (by C-G formula based on SCr of 0.42 mg/dL (L)). Liver Function Tests: Recent Labs  Lab 08/05/20 0030 08/05/20 0746 08/06/20 0618  AST 61* 42* 57*  ALT 40 29 38  ALKPHOS 103 84 100  BILITOT 1.0 1.0 1.8*  PROT 8.3* 6.1* 7.6  ALBUMIN 4.7 3.5 4.3   Recent Labs  Lab 08/05/20 0030  LIPASE 23   No results for input(s): AMMONIA in the last 168 hours. Coagulation Profile: Recent Labs  Lab 08/05/20 1242  INR 1.0   Cardiac Enzymes: Recent Labs  Lab 08/05/20 0746  CKTOTAL 364   BNP (last 3 results) No results for input(s): PROBNP in the last 8760 hours. HbA1C: No results for input(s): HGBA1C in  the last 72 hours. CBG: Recent Labs  Lab 08/06/20 0824  GLUCAP 99   Lipid Profile: No results for input(s): CHOL, HDL, LDLCALC, TRIG, CHOLHDL, LDLDIRECT in the last 72 hours. Thyroid Function Tests: Recent Labs    08/06/20 0618  TSH 4.467   Anemia Panel: No results for input(s): VITAMINB12, FOLATE, FERRITIN, TIBC, IRON, RETICCTPCT in the last 72 hours. Sepsis Labs: Recent Labs  Lab 08/05/20 0345 08/05/20 1242 08/05/20 1712 08/06/20 0718  PROCALCITON <0.10  --   --   --   LATICACIDVEN 5.6* 2.2* 1.5 1.2    Recent Results (from the past 240 hour(s))  Respiratory Panel by RT PCR (Flu A&B, Covid) - Nasopharyngeal Swab     Status: None   Collection Time: 08/05/20 12:30 AM   Specimen: Nasopharyngeal Swab  Result Value Ref Range Status   SARS Coronavirus 2 by RT PCR NEGATIVE NEGATIVE Final    Comment: (NOTE) SARS-CoV-2 target nucleic acids are NOT DETECTED.  The SARS-CoV-2 RNA is generally detectable in upper respiratoy specimens during the acute phase of infection. The lowest concentration of SARS-CoV-2 viral copies this assay can detect is 131 copies/mL. A negative result does not preclude SARS-Cov-2 infection and should not be used as the sole basis for treatment or other patient management decisions. A negative result may occur with  improper specimen collection/handling, submission of specimen other than nasopharyngeal swab, presence of viral mutation(s) within the areas targeted by this assay, and inadequate number of viral copies (<131 copies/mL). A negative result must be combined with clinical observations, patient history, and epidemiological information. The expected result is Negative.  Fact Sheet for Patients:  PinkCheek.be  Fact Sheet for Healthcare Providers:  GravelBags.it  This test is no t yet approved or cleared by the [place] FDA and  has been authorized for detection and/or diagnosis  of SARS-CoV-2 by FDA under an Emergency Use Authorization (EUA). This EUA will remain  in effect (meaning this test can be used) for the duration of the COVID-19 declaration under Section 564(b)(1) of the Act, 21 U.S.C. section 360bbb-3(b)(1), unless the authorization is terminated or revoked sooner.     Influenza A by PCR NEGATIVE NEGATIVE Final   Influenza B by PCR NEGATIVE NEGATIVE Final    Comment: (NOTE) The Xpert Xpress SARS-CoV-2/FLU/RSV assay is intended as an aid in  the diagnosis of influenza from Nasopharyngeal swab specimens and  should not be used as a sole basis for treatment. Nasal washings and  aspirates are unacceptable for Xpert Xpress SARS-CoV-2/FLU/RSV  testing.  Fact Sheet for Patients: PinkCheek.be  Fact Sheet for Healthcare Providers: GravelBags.it  This test is not yet approved or cleared by the [place] FDA and  has been authorized for detection and/or diagnosis of SARS-CoV-2 by  FDA under an Emergency Use Authorization (EUA). This EUA will remain  in effect (meaning this test can  be used) for the duration of the  Covid-19 declaration under Section 564(b)(1) of the Act, 21  U.S.C. section 360bbb-3(b)(1), unless the authorization is  terminated or revoked. Performed at [facility], [address], [place], [place] [address]   Urine culture     Status: Abnormal   Collection Time: 08/05/20 12:30 AM   Specimen: In/Out Cath Urine  Result Value Ref Range Status   Specimen Description   Final    IN/OUT CATH URINE Performed at [facility], [address] [place], [place] [address]    Special Requests   Final    NONE Performed at [facility], [address] [place], [place] [address]    Culture MULTIPLE SPECIES PRESENT, SUGGEST RECOLLECTION (A)  Final   Report Status 08/06/2020 FINAL  Final  Blood culture (routine x 2)     Status: None (Preliminary result)    Collection Time: 08/05/20  4:39 AM   Specimen: BLOOD  Result Value Ref Range Status   Specimen Description   Final    BLOOD LEFT ANTECUBITAL Performed at [facility], [address], [place], [place] [address]    Special Requests   Final    BOTTLES DRAWN AEROBIC AND ANAEROBIC Blood Culture adequate volume Performed at [facility], [address], [place], [place] [address]    Culture  Setup Time   Final    GRAM POSITIVE RODS IN BOTH AEROBIC AND ANAEROBIC BOTTLES CRITICAL RESULT CALLED TO, READ BACK BY AND VERIFIED WITH: SUSAN [name] 08/06/20 AT 1712 BY ACR Performed at [facility], [place] [address], [place], [place] [address]    Culture GRAM POSITIVE RODS  Final   Report Status PENDING  Incomplete  Blood culture (routine x 2)     Status: None (Preliminary result)   Collection Time: 08/05/20  4:39 AM   Specimen: BLOOD  Result Value Ref Range Status   Specimen Description BLOOD BLOOD LEFT HAND  Final   Special Requests   Final    BOTTLES DRAWN AEROBIC AND ANAEROBIC Blood Culture results may not be optimal due to an inadequate volume of blood received in culture bottles   Culture   Final    NO GROWTH 2 DAYS Performed at [facility], [address], [place], [place] [address]    Report Status PENDING  Incomplete         Radiology Studies: No results found.      Scheduled Meds: . vitamin C  1,000 mg Oral Daily  . aspirin EC  81 mg Oral Daily  . atenolol  25 mg Oral BID  . baclofen  10 mg Oral TID  . calcium-vitamin D  1 tablet Oral Q breakfast  . chlordiazePOXIDE  25 mg Oral TID  . cholecalciferol  5,000 Units Oral Daily  . enoxaparin (LOVENOX) injection  40 mg Subcutaneous Q24H  . feeding supplement (ENSURE ENLIVE)  237 mL Oral BID BM  . folic acid  1 mg Oral Daily  . LORazepam  0-4 mg Intravenous Q12H  . multivitamin with minerals  1 tablet Oral Daily  . pantoprazole  40 mg Oral BID AC  . PARoxetine  30 mg Oral Daily  .  thiamine  100 mg Oral Daily   Or  . thiamine  100 mg Intravenous Daily   Continuous Infusions: . ceFEPime (MAXIPIME) IV 2 g (08/07/20 1430)  . dextrose 75 mL/hr at 08/07/20 1429  . vancomycin 750 mg (08/07/20 1252)  Assessment & Plan:   Principal Problem:   SIRS (systemic inflammatory response syndrome) (HCC) Active Problems:   Hyponatremia   Alcohol use   Diarrhea   HTN (hypertension)   Malnutrition of moderate degree   Depression   Sepsis (HCC)   Paroxysmal A-fib (HCC)   GERD (gastroesophageal reflux disease)   Right hip pain   Hypophosphatemia   SIRS/sepsis - Patient meets criteria for SIRS with leukocytosis with WBC 20.7, tachycardia with heart rate of 116, tachypnea with RR 22. Lactic acid 4.8 -->5.6. on admission  Lactic acid improved Leukocytosis improved UA negative Chest x-ray negative.   1 blood culture GM negative Rod. Awaiting organism and sensitivity report  We will continue on vancomycin and cefepime until final culture results are back  Add lactobacillus  Right hip pain: X-ray of right hip negative for fracture, but it showed degenerative.  As needed pain meds PT OT-recommend SNF For placement  Hyponatremia: Post likely due to alcohol abuse, poor oral intake and dehydration and potomania. Initial Na is 129  10/5- Na 138 today Continue with D5 IVF.   Altered mental Status- likely multifactoria, metabolic in origin . Little better.  Still confused Will continue supportive care   Alcohol use: CiWA protocol, monitoring for withdrawal Had scored in ED last night slept. Continue to monitor   Diarrhea: None reported Stool studies pending   HTN (hypertension) -Stable continue on atenolol    Malnutrition of moderate degree -Continue nutrition supplement  Depression -Continue Paroxetine  Paroxysmal A-fib ([place]): not on chronic anticoagulants at home. Continue atenolol  GERD (gastroesophageal reflux disease): Continue  Protonix  Hypokalemia- replace  And ck am labs Mg 1.9  DVT prophylaxis: Lovenox Code Status: Full Family Communication: None at bedside  Status is: Inpatient  Remains inpatient appropriate because:IV treatments appropriate due to intensity of illness or inability to take PO   Dispo: The patient is from: Home              Anticipated d/c is to: SNF              Anticipated d/c date is: 2 days              Patient currently is not medically stable to d/c.Needs iv abx , has still altered MS, not at baseline. Needs SNF. Case mx was notified by me            LOS: 2 days   Time spent: 45 minutes with >50% on coc    Nolberto Hanlon, MD [facility] Pager [phone]  If 7PM-7AM, please contact night-coverage [email or web address] Password [facility] 08/07/2020, 4:28 PM

## 2020-08-07 NOTE — Progress Notes (Signed)
Initial Nutrition Assessment  DOCUMENTATION CODES:   Severe malnutrition in context of social or environmental circumstances  INTERVENTION:   Ensure Enlive po BID, each supplement provides 350 kcal and 20 grams of protein  Magic cup TID with meals, each supplement provides 290 kcal and 9 grams of protein  MVI, thiamine and folic acid in setting of etoh abuse  Dysphagia 3 diet   Pt at high refeed risk; recommend monitor potassium, magnesium and phosphorus labs daily until stable  NUTRITION DIAGNOSIS:   Severe Malnutrition related to social / environmental circumstances (etoh abuse) as evidenced by severe fat depletion, severe muscle depletion.  GOAL:   Patient will meet greater than or equal to 90% of their needs  MONITOR:   PO intake, Supplement acceptance, Labs, Weight trends, Skin, I & O's  REASON FOR ASSESSMENT:   Malnutrition Screening Tool    ASSESSMENT:   69 y.o. male with medical history significant of hypertension, GERD, depression, PAF, alcohol abuse and chronic right-sided weakness who is admitted with generalized weakness and diarrhea.   Met with pt in room today. Pt is well known to nutrition department and this RD from multiple previous admits. Pt with poor appetite and oral intake at baseline r/t chronic etoh abuse. Pt with confusion and is a poor historian. Pt does generally eat well while in hospital; pt is also compliant with Ensure supplements. Pt documented to have eaten 100% of his breakfast today. Pt is also drinking Ensure. RD will add supplements to meal trays and change pt over to a mechanical soft diet. Pt usually requires a dysphagia diet while confused but is then able to advance to a regular diet once his confusion is resolved. Pt is at refeed risk.   Medications reviewed and include: vitamin C, aspirin, oscal w/ D, D3, lovenox, folic acid, MVI, protonix, paxil, thiamine, cefepime, 5% dextrose _0 /hr, vancomycin   Labs reviewed: K 3.4(L), BUN  7(L), creat 0.42(L) P 3.4 wnl, Mg 1.9 wnl- 10/4  NUTRITION - FOCUSED PHYSICAL EXAM:    Most Recent Value  Orbital Region Severe depletion  Upper Arm Region Severe depletion  Thoracic and Lumbar Region Moderate depletion  Buccal Region Severe depletion  Temple Region Severe depletion  Clavicle Bone Region Severe depletion  Clavicle and Acromion Bone Region Moderate depletion  Scapular Bone Region Moderate depletion  Dorsal Hand Moderate depletion  Patellar Region Severe depletion  Anterior Thigh Region Severe depletion  Posterior Calf Region Severe depletion  Edema (RD Assessment) None  Hair Reviewed  Eyes Reviewed  Mouth Reviewed  Skin Reviewed  Nails Reviewed     Diet Order:   Diet Order            DIET DYS 3 Room service appropriate? No; Fluid consistency: Thin  Diet effective now                EDUCATION NEEDS:   No education needs have been identified at this time  Skin:  Skin Assessment: Reviewed RN Assessment  Last BM:  10/4  Height:   Ht Readings from Last 1 Encounters:  08/06/20 _1  (1.626 m)    Weight:   Wt Readings from Last 1 Encounters:  08/06/20 55.7 kg    Ideal Body Weight:  59 kg  BMI:  Body mass index is 21.08 kg/m.  Estimated Nutritional Needs:   Kcal:  1600-1800kcal/day  Protein:  80-90g/day  Fluid:  1.4-1.7L/day  Koleen Distance MS, RD, LDN Please refer to [facility] for RD and/or RD on-call/weekend/after hours  pager

## 2020-08-08 DIAGNOSIS — E871 Hypo-osmolality and hyponatremia: Secondary | ICD-10-CM | POA: Diagnosis not present

## 2020-08-08 DIAGNOSIS — R6510 Systemic inflammatory response syndrome (SIRS) of non-infectious origin without acute organ dysfunction: Secondary | ICD-10-CM | POA: Diagnosis not present

## 2020-08-08 DIAGNOSIS — I480 Paroxysmal atrial fibrillation: Secondary | ICD-10-CM | POA: Diagnosis not present

## 2020-08-08 DIAGNOSIS — Z7289 Other problems related to lifestyle: Secondary | ICD-10-CM | POA: Diagnosis not present

## 2020-08-08 LAB — BASIC METABOLIC PANEL
Anion gap: 9 (ref 5–15)
BUN: 14 mg/dL (ref 8–23)
CO2: 29 mmol/L (ref 22–32)
Calcium: 8.8 mg/dL — ABNORMAL LOW (ref 8.9–10.3)
Chloride: 97 mmol/L — ABNORMAL LOW (ref 98–111)
Creatinine, Ser: 0.46 mg/dL — ABNORMAL LOW (ref 0.61–1.24)
GFR calc non Af Amer: >60 mL/min (ref >60)
Glucose, Bld: 112 mg/dL — ABNORMAL HIGH (ref 70–99)
Potassium: 3.6 mmol/L (ref 3.5–5.1)
Sodium: 135 mmol/L (ref 135–145)

## 2020-08-08 MED ORDER — CHLORDIAZEPOXIDE HCL 25 MG PO CAPS
25.0000 mg | ORAL_CAPSULE | Freq: Every day | ORAL | Status: AC
  Administered 2020-08-09: 25 mg via ORAL
  Filled 2020-08-08: qty 1

## 2020-08-08 MED ORDER — CHLORDIAZEPOXIDE HCL 25 MG PO CAPS
25.0000 mg | ORAL_CAPSULE | Freq: Two times a day (BID) | ORAL | Status: AC
  Administered 2020-08-08: 25 mg via ORAL
  Filled 2020-08-08: qty 1

## 2020-08-08 NOTE — Hospital Course (Signed)
Douglas Peterson. is a 69 y.o. male with medical history significant of hypertension, GERD, depression, PAF, alcohol abuse, chronic right-sided weakness presented to the ED due to generalized weakness and diarrhea. Pt continues to drink alcohol heavily on daily basis. Covid was negative. Toxicology was negative. Alcohol level on admission was 268.

## 2020-08-08 NOTE — Progress Notes (Addendum)
PROGRESS NOTE    Douglas Peterson.   BMS:[id number]  DOB: May 17, 1951  PCP: [name], MD    DOA: 08/05/2020 LOS: 3   Brief Narrative   Douglas Peterson. is a 69 y.o. male with medical history significant of hypertension, GERD, depression, PAF, alcohol abuse, chronic right-sided weakness presented to the ED due to generalized weakness and diarrhea. Pt continues to drink alcohol heavily on daily basis. Covid was negative. Toxicology was negative. Alcohol level on admission was 268.     Assessment & Plan   Principal Problem:   SIRS (systemic inflammatory response syndrome) (HCC) Active Problems:   Hyponatremia   Alcohol use   Diarrhea   HTN (hypertension)   Malnutrition of moderate degree   Depression   Sepsis (HCC)   Paroxysmal A-fib (HCC)   GERD (gastroesophageal reflux disease)   Right hip pain   Hypophosphatemia   SIRS present on admission  Sepsis ruled out.   Patient met criteria for SIRS with leukocytosis with WBC 20.7, tachycardia with heart rate of 116, tachypnea with RR 22.  Lactic acid 4.8 -->5.6. on admission, likely due to hypovolemia/dehydration. Lactic acid and leukocytosis have improved. UA and chest xray negative. 1 blood culture grew gram positive rods, suspect contaminate.  Has been on Vanc/Cefepime since admission - stop antibiotics and monitor clinically. Repeat blood culture pending.   Right hip pain - X-ray of right hip negative for fracture, but it showed degenerative changes.  PRN pain meds.   PT OT-recommend SNF, placement pending.   Hyponatremia - most likely due to alcohol abuse, poor PO intake, dehydration and beer potomania. Initial Na was 129.  Improved with IV hydration.  Stop IV fluids today and monitor.    Acute metabolic encephalopathy - likely multifactoria, due to EtOH withdrawal, dehydration. Slowly improving, but still confused.  Continue supportive care.    Alcohol abuse - CIWA protocol with PRN Ativan.  Decrease scheduled  Librium to BID and taper down.    Diarrhea - reported on admission, no further episodes reported.   Stool studies pending.   Hypertension - stable, continue atenolol     Malnutrition of moderate degree - continue nutrition supplements   Depression - continue paroxetine, Vistaril PRN   Paroxysmal A-fib - not on chronic anticoagulants at home.  Continue atenolol.   GERD - continue Protonix   Hypokalemia - replaced.  Monitor BMP, Mg and replace as needed.     DVT prophylaxis: enoxaparin (LOVENOX) injection 40 mg Start: 08/05/20 2200    Diet:  Diet Orders (From admission, onward)     Start     Ordered   08/07/20 1206  DIET DYS 3 Room service appropriate? No; Fluid consistency: Thin  Diet effective now       Question Answer Comment  Room service appropriate? No   Fluid consistency: Thin      08/07/20 1205              Code Status: Full Code    Subjective 08/08/20    Patient seen up in chair today.  He reports he feels pretty well today.  No acute events reported.  He was sleeping but awoke easily, appears drowsy.  Per RN, continues to be confused.   Disposition Plan & Communication   Status is: Inpatient  Remains inpatient appropriate because:IV treatments appropriate due to intensity of illness or inability to take PO   Dispo: The patient is from: Home  Anticipated d/c is to: SNF              Anticipated d/c date is: 2 days              Patient currently is not medically stable to d/c.   Family Communication: none at bedside, will attempt to call    Consults, Procedures, Significant Events   Consultants:  None  Procedures:  None  Antimicrobials:  Anti-infectives (From admission, onward)    Start     Dose/Rate Route Frequency Ordered Stop   08/05/20 1800  vancomycin (VANCOREADY) IVPB 750 mg/150 mL  Status:  Discontinued        750 mg 150 mL/hr over 60 Minutes Intravenous Every 12 hours 08/05/20 0804 08/08/20 1159   08/05/20 1400   ceFEPIme (MAXIPIME) 2 g in sodium chloride 0.9 % 100 mL IVPB  Status:  Discontinued        2 g 200 mL/hr over 30 Minutes Intravenous Every 8 hours 08/05/20 0804 08/08/20 1159   08/05/20 0445  vancomycin (VANCOCIN) IVPB 1000 mg/200 mL premix        1,000 mg 200 mL/hr over 60 Minutes Intravenous  Once 08/05/20 0430 08/05/20 0728   08/05/20 0430  ceFEPIme (MAXIPIME) 1 g in sodium chloride 0.9 % 100 mL IVPB        1 g 200 mL/hr over 30 Minutes Intravenous  Once 08/05/20 0430 08/05/20 0611         Objective   Vitals:   08/08/20 0408 08/08/20 0412 08/08/20 0800 08/08/20 1204  BP: 137/75 137/75 (!) 144/105 (!) 141/82  Pulse: (!) 57 60 63 (!) 58  Resp: 20 20    Temp: 97.9 F (36.6 C) 97.9 F (36.6 C) 97.8 F (36.6 C)   TempSrc: Oral Oral Oral   SpO2: 98% 96% 99% 95%  Weight:      Height:        Intake/Output Summary (Last 24 hours) at 08/08/2020 1519 Last data filed at 08/08/2020 1026 Gross per 24 hour  Intake 1852.57 ml  Output 400 ml  Net 1452.57 ml   Filed Weights   08/05/20 0026 08/06/20 2205  Weight: 54.4 kg 55.7 kg    Physical Exam:  General exam: awake, alert, no acute distress HEENT: moist mucus membranes, hearing grossly normal  Respiratory system: CTAB, no wheezes, rales or rhonchi, normal respiratory effort. Cardiovascular system: normal S1/S2, RRR, no pedal edema.   Gastrointestinal system: soft, NT, ND, +bowel sounds. Extremities: no cyanosis, normal tone, hypertonic right upper extremity  Labs   Data Reviewed: I have personally reviewed following labs and imaging studies  CBC: Recent Labs  Lab 08/05/20 0030 08/06/20 0718 08/07/20 0528  WBC 20.7* 6.5 6.1  NEUTROABS 8.1*  --   --   HGB 16.5 15.3 13.9  HCT 46.7 43.5 38.6*  MCV 84.9 85.6 86.0  PLT 232 106* 035*   Basic Metabolic Panel: Recent Labs  Lab 08/05/20 0345 08/05/20 0746 08/06/20 0618 08/07/20 0528 08/08/20 0449  NA 135 137 138 136 135  K 4.3 3.4* 3.8 3.4* 3.6  CL 97* 97* 99 100  97*  CO2 28 30 24 28 29   GLUCOSE 88 100* 94 108* 112*  BUN <5* <5* 7* 7* 14  CREATININE 0.43* 0.40* 0.44* 0.42* 0.46*  CALCIUM 8.3* 8.4* 9.3 8.5* 8.8*  MG  --   --  1.9  --   --   PHOS  --  1.4* 3.4  --   --  GFR: Estimated Creatinine Clearance: 68.7 mL/min (A) (by C-G formula based on SCr of 0.46 mg/dL (L)). Liver Function Tests: Recent Labs  Lab 08/05/20 0030 08/05/20 0746 08/06/20 0618  AST 61* 42* 57*  ALT 40 29 38  ALKPHOS 103 84 100  BILITOT 1.0 1.0 1.8*  PROT 8.3* 6.1* 7.6  ALBUMIN 4.7 3.5 4.3   Recent Labs  Lab 08/05/20 0030  LIPASE 23   No results for input(s): AMMONIA in the last 168 hours. Coagulation Profile: Recent Labs  Lab 08/05/20 1242  INR 1.0   Cardiac Enzymes: Recent Labs  Lab 08/05/20 0746  CKTOTAL 364   BNP (last 3 results) No results for input(s): PROBNP in the last 8760 hours. HbA1C: No results for input(s): HGBA1C in the last 72 hours. CBG: Recent Labs  Lab 08/06/20 0824  GLUCAP 99   Lipid Profile: No results for input(s): CHOL, HDL, LDLCALC, TRIG, CHOLHDL, LDLDIRECT in the last 72 hours. Thyroid Function Tests: Recent Labs    08/06/20 0618  TSH 4.467   Anemia Panel: No results for input(s): VITAMINB12, FOLATE, FERRITIN, TIBC, IRON, RETICCTPCT in the last 72 hours. Sepsis Labs: Recent Labs  Lab 08/05/20 0345 08/05/20 1242 08/05/20 1712 08/06/20 0718  PROCALCITON <0.10  --   --   --   LATICACIDVEN 5.6* 2.2* 1.5 1.2    Recent Results (from the past 240 hour(s))  Respiratory Panel by RT PCR (Flu A&B, Covid) - Nasopharyngeal Swab     Status: None   Collection Time: 08/05/20 12:30 AM   Specimen: Nasopharyngeal Swab  Result Value Ref Range Status   SARS Coronavirus 2 by RT PCR NEGATIVE NEGATIVE Final    Comment: (NOTE) SARS-CoV-2 target nucleic acids are NOT DETECTED.  The SARS-CoV-2 RNA is generally detectable in upper respiratoy specimens during the acute phase of infection. The lowest concentration of  SARS-CoV-2 viral copies this assay can detect is 131 copies/mL. A negative result does not preclude SARS-Cov-2 infection and should not be used as the sole basis for treatment or other patient management decisions. A negative result may occur with  improper specimen collection/handling, submission of specimen other than nasopharyngeal swab, presence of viral mutation(s) within the areas targeted by this assay, and inadequate number of viral copies (<131 copies/mL). A negative result must be combined with clinical observations, patient history, and epidemiological information. The expected result is Negative.  Fact Sheet for Patients:  PinkCheek.be  Fact Sheet for Healthcare Providers:  GravelBags.it  This test is no t yet approved or cleared by the [place] FDA and  has been authorized for detection and/or diagnosis of SARS-CoV-2 by FDA under an Emergency Use Authorization (EUA). This EUA will remain  in effect (meaning this test can be used) for the duration of the COVID-19 declaration under Section 564(b)(1) of the Act, 21 U.S.C. section 360bbb-3(b)(1), unless the authorization is terminated or revoked sooner.     Influenza A by PCR NEGATIVE NEGATIVE Final   Influenza B by PCR NEGATIVE NEGATIVE Final    Comment: (NOTE) The Xpert Xpress SARS-CoV-2/FLU/RSV assay is intended as an aid in  the diagnosis of influenza from Nasopharyngeal swab specimens and  should not be used as a sole basis for treatment. Nasal washings and  aspirates are unacceptable for Xpert Xpress SARS-CoV-2/FLU/RSV  testing.  Fact Sheet for Patients: PinkCheek.be  Fact Sheet for Healthcare Providers: GravelBags.it  This test is not yet approved or cleared by the [place] FDA and  has been authorized for detection and/or diagnosis  of SARS-CoV-2 by  FDA under an Emergency Use  Authorization (EUA). This EUA will remain  in effect (meaning this test can be used) for the duration of the  Covid-19 declaration under Section 564(b)(1) of the Act, 21  U.S.C. section 360bbb-3(b)(1), unless the authorization is  terminated or revoked. Performed at [facility], [address], [place], [place] [address]   Urine culture     Status: Abnormal   Collection Time: 08/05/20 12:30 AM   Specimen: In/Out Cath Urine  Result Value Ref Range Status   Specimen Description   Final    IN/OUT CATH URINE Performed at [facility], [address] [place], [place] [address]    Special Requests   Final    NONE Performed at [facility], [address] [place], [place] [address]    Culture MULTIPLE SPECIES PRESENT, SUGGEST RECOLLECTION (A)  Final   Report Status 08/06/2020 FINAL  Final  Blood culture (routine x 2)     Status: None (Preliminary result)   Collection Time: 08/05/20  4:39 AM   Specimen: BLOOD  Result Value Ref Range Status   Specimen Description   Final    BLOOD LEFT ANTECUBITAL Performed at [facility], [address] [place], [place] [address]    Special Requests   Final    BOTTLES DRAWN AEROBIC AND ANAEROBIC Blood Culture adequate volume Performed at [facility], [address] [place], [place] [address]    Culture  Setup Time   Final    GRAM POSITIVE RODS IN BOTH AEROBIC AND ANAEROBIC BOTTLES CRITICAL RESULT CALLED TO, READ BACK BY AND VERIFIED WITH: SUSAN [name] 08/06/20 AT 1712 BY ACR Performed at [facility], [place] [address], [place], [place] [address]    Culture GRAM POSITIVE RODS  Final   Report Status PENDING  Incomplete  Blood culture (routine x 2)     Status: None (Preliminary result)   Collection Time: 08/05/20  4:39 AM   Specimen: BLOOD  Result Value Ref Range Status   Specimen Description BLOOD BLOOD LEFT HAND  Final   Special Requests   Final    BOTTLES DRAWN AEROBIC AND ANAEROBIC  Blood Culture results may not be optimal due to an inadequate volume of blood received in culture bottles   Culture   Final    NO GROWTH 3 DAYS Performed at [facility], [address] [place], [place] [address]    Report Status PENDING  Incomplete      Imaging Studies   No results found.   Medications   Scheduled Meds:  acidophilus  2 capsule Oral TID   vitamin C  1,000 mg Oral Daily   aspirin EC  81 mg Oral Daily   atenolol  25 mg Oral BID   baclofen  10 mg Oral TID   calcium-vitamin D  1 tablet Oral Q breakfast   chlordiazePOXIDE  25 mg Oral BID   [START ON 08/09/2020] chlordiazePOXIDE  25 mg Oral Daily   cholecalciferol  5,000 Units Oral Daily   enoxaparin (LOVENOX) injection  40 mg Subcutaneous Q24H   feeding supplement (ENSURE ENLIVE)  237 mL Oral BID BM   folic acid  1 mg Oral Daily   LORazepam  0-4 mg Intravenous Q12H   multivitamin with minerals  1 tablet Oral Daily   pantoprazole  40 mg Oral BID AC   PARoxetine  30 mg Oral Daily   thiamine  100 mg Oral Daily  Or   thiamine  100 mg Intravenous Daily   Continuous Infusions:      LOS: 3 days    Time spent: 30 minutes    Ezekiel Slocumb, DO [facility]  08/08/2020, 3:19 PM    If 7PM-7AM, please contact night-coverage. How to contact the [facility] Attending or Consulting provider [place] or covering provider during after hours [place], for this patient?    Check the care team in [facility] and look for a) attending/consulting TRH provider listed and b) the [facility] team listed Log into [email or web address] and use [place]'s universal password to access. If you do not have the password, please contact the hospital operator. Locate the [facility] provider you are looking for under [facility] and page to a number that you can be directly reached. If you still have difficulty reaching the provider, please page the [facility] (Director on Call) for the Hospitalists listed on amion for assistance.

## 2020-08-08 NOTE — Care Management Important Message (Signed)
Important Message  Patient Details  Name: Douglas Peterson. MRN: [id number] Date of Birth: 04-Dec-1950   Medicare Important Message Given:  Yes     [name] 08/08/2020, 11:31 AM

## 2020-08-08 NOTE — TOC Progression Note (Addendum)
Transition of Care (TOC) - Progression Note    Patient Details  Name: Douglas Peterson. MRN: [id number] Date of Birth: 05/14/1951  Transition of Care [facility]) CM/SW Contact  Candie Chroman, LCSW Phone Number: 08/08/2020, 1:32 PM  Clinical Narrative:  Tried calling DSS social worker again. Voicemail is full. Left message for admissions coordinator at [facility] in [place] yesterday but no call back yet. Tried calling again today. No answer or option to leave a voicemail.   1:59 pm: Spoke with admissions coordinator at [facility]. Patient was there 9/10-9/27 (17 days) and was discharged because insurance issued a notice of Medicare non-coverage.  Expected Discharge Plan: Skilled Nursing Facility Barriers to Discharge: Continued Medical Work up  Expected Discharge Plan and Services Expected Discharge Plan: [place]     Post Acute Care Choice: [place]                                         Social Determinants of Health (SDOH) Interventions    Readmission Risk Interventions Readmission Risk Prevention Plan 05/19/2020 12/13/2019 12/12/2019  Transportation Screening Complete Complete Complete  PCP or Specialist Appt within 3-5 Days - (No Data) -  HRI or [place] - (No Data) -  Palliative Care Screening - Complete Complete  Medication Review (RN Care Manager) Complete Complete Complete  PCP or Specialist appointment within 3-5 days of discharge Complete - -  [place] or Home Care Consult Complete - -  SW Recovery Care/Counseling Consult Complete - -  Palliative Care Screening Not Applicable - -  [place] Not Applicable - -  Some recent data might be hidden

## 2020-08-08 NOTE — Progress Notes (Signed)
Physical Therapy Treatment Patient Details Name: Douglas Peterson. MRN: [id number] DOB: 27-Jan-1951 Today's Date: 08/08/2020    History of Present Illness Douglas Peterson. is a [phone] c PMH: HTN, GERD, depression, PAF, ETOH abuse, remote CVA c chronic Rt spastic hemiplegia, recent clavicular fracture (in sling (NWB), who [facility] on 10[facility]3 c generalized weakness and diarrhea.    PT Comments    Patient alert, oriented to self, place, disoriented to situation, time, denied pain. The patient demonstrated good progression towards goals this session. Pt able to transfer to EOB with minA for trunk elevation. Able to sit with fair balance, feet supported and single UE supported. Sit <> stand with minA and handheld assist, and pt able to transfer to recliner with LUE use of bed rail/recliner arm. After a rest break, the patient was able to ambulate ~60f with minA and handheld assist. Pt with mild-moderate posterior lean throughout, unable to maintain static/dynamic standing balance without assist. Returned to recliner, all needs in reach at end of session. The patient would benefit from further skilled PT to continue to progress towards goals, recommendation remains appropriate. Quad cane placed in room for next PT session.     Follow Up Recommendations  SNF;Supervision/Assistance - 24 hour     Equipment Recommendations  None recommended by PT    Recommendations for Other Services       Precautions / Restrictions Precautions Precautions: Fall Precaution Comments: RUE sling Restrictions Weight Bearing Restrictions: Yes RUE Weight Bearing: Non weight bearing Other Position/Activity Restrictions: Rt clavicle fx on 06/05/20    Mobility  Bed Mobility Overal bed mobility: Needs Assistance Bed Mobility: Supine to Sit     Supine to sit: Min assist;HOB elevated     General bed mobility comments: very light minA provided for complete trunk elevation  Transfers Overall transfer level:  Needs assistance Equipment used: 1 person hand held assist Transfers: Sit to/from Stand Sit to Stand: Min assist         General transfer comment: Pt utilized bed rail and recliner arm to transfer  Ambulation/Gait Ambulation/Gait assistance: MHerbalist(Feet): 17 Feet Assistive device: 1 person hand held assist       General Gait Details: Pt able to ambulate to door in back in room with minA due to mild-moderate posterior lean noted and handheld assist. Pt unable to maintain static standing without assistance as well due to posterior lean   Stairs             Wheelchair Mobility    Modified Rankin (Stroke Patients Only)       Balance Overall balance assessment: Needs assistance Sitting-balance support: Feet supported;Single extremity supported Sitting balance-Leahy Scale: Fair       Standing balance-Leahy Scale: Poor Standing balance comment: mild to moderate posterior lean noted throughout standing/ambulation with at least minA                            Cognition Arousal/Alertness: Awake/alert Behavior During Therapy: WFL for tasks assessed/performed Overall Cognitive Status: Within Functional Limits for tasks assessed                                 General Comments: oriented to name, place, disoriented to time, situation      Exercises Other Exercises Other Exercises: quad cane placed in patient room at end of session to assist with mobilty  during this hospital stay    General Comments        Pertinent Vitals/Pain Pain Assessment: No/denies pain    Home Living                      Prior Function            PT Goals (current goals can now be found in the care plan section) Progress towards PT goals: Progressing toward goals    Frequency    Min 2X/week      PT Plan Current plan remains appropriate    Co-evaluation              AM-PAC PT "6 Clicks" Mobility   Outcome  Measure  Help needed turning from your back to your side while in a flat bed without using bedrails?: A Little Help needed moving from lying on your back to sitting on the side of a flat bed without using bedrails?: A Lot Help needed moving to and from a bed to a chair (including a wheelchair)?: A Little Help needed standing up from a chair using your arms (e.g., wheelchair or bedside chair)?: A Little Help needed to walk in hospital room?: A Lot Help needed climbing 3-5 steps with a railing? : Total 6 Click Score: 14    End of Session Equipment Utilized During Treatment: Gait belt Activity Tolerance: Patient tolerated treatment well Patient left: in chair;with call bell/phone within reach;with chair alarm set Nurse Communication: Mobility status PT Visit Diagnosis: Unsteadiness on feet (R26.81);Other abnormalities of gait and mobility (R26.89);Repeated falls (R29.6);Muscle weakness (generalized) (M62.81);History of falling (Z91.81);Difficulty in walking, not elsewhere classified (R26.2)     Time: 0938-1829 PT Time Calculation (min) (ACUTE ONLY): 24 min  Charges:  $Therapeutic Exercise: 23-37 mins                    Lieutenant Diego PT, DPT 1:06 PM,08/08/20

## 2020-08-09 DIAGNOSIS — I10 Essential (primary) hypertension: Secondary | ICD-10-CM

## 2020-08-09 DIAGNOSIS — R6510 Systemic inflammatory response syndrome (SIRS) of non-infectious origin without acute organ dysfunction: Secondary | ICD-10-CM | POA: Diagnosis not present

## 2020-08-09 DIAGNOSIS — I480 Paroxysmal atrial fibrillation: Secondary | ICD-10-CM | POA: Diagnosis not present

## 2020-08-09 DIAGNOSIS — Z7289 Other problems related to lifestyle: Secondary | ICD-10-CM | POA: Diagnosis not present

## 2020-08-09 LAB — MAGNESIUM: Magnesium: 1.8 mg/dL (ref 1.7–2.4)

## 2020-08-09 LAB — BASIC METABOLIC PANEL
Anion gap: 9 (ref 5–15)
BUN: 16 mg/dL (ref 8–23)
CO2: 29 mmol/L (ref 22–32)
Calcium: 9.4 mg/dL (ref 8.9–10.3)
Chloride: 97 mmol/L — ABNORMAL LOW (ref 98–111)
Creatinine, Ser: 0.49 mg/dL — ABNORMAL LOW (ref 0.61–1.24)
GFR calc non Af Amer: >60 mL/min (ref >60)
Glucose, Bld: 94 mg/dL (ref 70–99)
Potassium: 3.8 mmol/L (ref 3.5–5.1)
Sodium: 135 mmol/L (ref 135–145)

## 2020-08-09 LAB — PHOSPHORUS: Phosphorus: 2.9 mg/dL (ref 2.5–4.6)

## 2020-08-09 LAB — CULTURE, BLOOD (ROUTINE X 2): Special Requests: ADEQUATE

## 2020-08-09 NOTE — Progress Notes (Addendum)
PROGRESS NOTE    Douglas Peterson.   MBW:[id number]  DOB: 1951-06-29  PCP: [name], MD    DOA: 08/05/2020 LOS: 4   Brief Narrative   Douglas Mcginley. is a 69 y.o. male with medical history significant of hypertension, GERD, depression, PAF, alcohol abuse, chronic right-sided weakness presented to the ED due to generalized weakness and diarrhea. Pt continues to drink alcohol heavily on daily basis. Covid was negative. Toxicology was negative. Alcohol level on admission was 268.     Assessment & Plan   Principal Problem:   SIRS (systemic inflammatory response syndrome) (HCC) Active Problems:   Hyponatremia   Alcohol use   Diarrhea   HTN (hypertension)   Malnutrition of moderate degree   Depression   Sepsis (HCC)   Paroxysmal A-fib (HCC)   GERD (gastroesophageal reflux disease)   Right hip pain   Hypophosphatemia   SIRS present on admission  Sepsis ruled out.   Patient met criteria for SIRS with leukocytosis with WBC 20.7, tachycardia with heart rate of 116, tachypnea with RR 22.  Lactic acid 4.8 -->5.6. on admission, likely due to hypovolemia/dehydration. Lactic acid and leukocytosis have improved. UA and chest xray negative. 1 blood culture grew Corynebacterium sp, suspect contaminate.  Has been on Vanc/Cefepime since admission - stopped antibiotics 10/6 and monitor clinically. Repeat blood culture pending - neg to date. Patient remains asymptomatic from infection standpoint.   Right hip pain - X-ray of right hip negative for fracture, but it showed degenerative changes.  PRN pain meds.   PT OT-recommend SNF, placement pending.   Hyponatremia - most likely due to alcohol abuse, poor PO intake, dehydration and beer potomania. Initial Na was 129.   Resolved with IV hydration.  Off IV fluids since 10/6.   Acute metabolic encephalopathy - likely multifactorial, due to EtOH withdrawal, dehydration. Slowly improving.   Continue supportive care.    Uncomplicated  alcohol withdrawal - resolved Alcohol abuse with dependence - CIWA protocol with PRN Ativan.  Taper off Librium.   Diarrhea - reported on admission, no further episodes reported.   Stool studies pending but no BM - can cancel.   Hypertension - stable, continue atenolol     Malnutrition of moderate degree - continue nutrition supplements   Depression - continue paroxetine, Vistaril PRN   Paroxysmal A-fib - not on chronic anticoagulants at home.  Continue atenolol.   GERD - continue Protonix   Hypokalemia - replaced.  Monitor BMP, Mg and replace as needed.     DVT prophylaxis: enoxaparin (LOVENOX) injection 40 mg Start: 08/05/20 2200   Diet:  Diet Orders (From admission, onward)    Start     Ordered   08/07/20 1206  DIET DYS 3 Room service appropriate? No; Fluid consistency: Thin  Diet effective now       Question Answer Comment  Room service appropriate? No   Fluid consistency: Thin      08/07/20 1205            Code Status: Full Code    Subjective 08/09/20    Patient seen at bedside this AM.  Reports he feels fine.  Appetite getting better and tolerating diet without abdominal pain, nausea or vomiting.  No acute complaints.   Disposition Plan & Communication   Status is: Inpatient  Remains inpatient appropriate because:IV treatments appropriate due to intensity of illness or inability to take PO. SNF placement pending.   Dispo: The patient is from: Home  Anticipated d/c is to: SNF              Anticipated d/c date is: 1 day              Patient currently IS medically stable for d/c.   Family Communication: none at bedside, will attempt to call.  Unsuccessful in reach daughter by phone this afternoon.    Consults, Procedures, Significant Events   Consultants:   None  Procedures:   None  Antimicrobials:  Anti-infectives (From admission, onward)   Start     Dose/Rate Route Frequency Ordered Stop   08/05/20 1800  vancomycin  (VANCOREADY) IVPB 750 mg/150 mL  Status:  Discontinued        750 mg 150 mL/hr over 60 Minutes Intravenous Every 12 hours 08/05/20 0804 08/08/20 1159   08/05/20 1400  ceFEPIme (MAXIPIME) 2 g in sodium chloride 0.9 % 100 mL IVPB  Status:  Discontinued        2 g 200 mL/hr over 30 Minutes Intravenous Every 8 hours 08/05/20 0804 08/08/20 1159   08/05/20 0445  vancomycin (VANCOCIN) IVPB 1000 mg/200 mL premix        1,000 mg 200 mL/hr over 60 Minutes Intravenous  Once 08/05/20 0430 08/05/20 0728   08/05/20 0430  ceFEPIme (MAXIPIME) 1 g in sodium chloride 0.9 % 100 mL IVPB        1 g 200 mL/hr over 30 Minutes Intravenous  Once 08/05/20 0430 08/05/20 0611        Objective   Vitals:   08/08/20 2343 08/09/20 0600 08/09/20 0714 08/09/20 1133  BP: 120/79 139/76 115/68 (!) 121/92  Pulse: 70 61 (!) 58 72  Resp: _0 Temp: 97.8 F (36.6 C) 97.6 F (36.4 C) 97.6 F (36.4 C) 97.6 F (36.4 C)  TempSrc: Oral Oral Oral   SpO2: 96% 96% 95% 95%  Weight:      Height:        Intake/Output Summary (Last 24 hours) at 08/09/2020 1445 Last data filed at 08/09/2020 1421 Gross per 24 hour  Intake 822.68 ml  Output 1050 ml  Net -227.32 ml   Filed Weights   08/05/20 0026 08/06/20 2205  Weight: 54.4 kg 55.7 kg    Physical Exam:  General exam: awake, alert, no acute distress HEENT: moist mucus membranes, hearing grossly normal, soft voice / poor vocal projection  Respiratory system: CTAB, no wheezes, rales or rhonchi, normal respiratory effort. Cardiovascular system: normal S1/S2, RRR, no pedal edema.   Gastrointestinal system: soft, NT, ND, +bowel sounds. Extremities: no cyanosis, normal tone, hypertonic right upper extremity stable  Labs   Data Reviewed: I have personally reviewed following labs and imaging studies  CBC: Recent Labs  Lab 08/05/20 0030 08/06/20 0718 08/07/20 0528  WBC 20.7* 6.5 6.1  NEUTROABS 8.1*  --   --   HGB 16.5 15.3 13.9  HCT 46.7 43.5 38.6*  MCV 84.9  85.6 86.0  PLT 232 106* 505*   Basic Metabolic Panel: Recent Labs  Lab 08/05/20 0746 08/06/20 0618 08/07/20 0528 08/08/20 0449 08/09/20 0551  NA 137 138 136 135 135  K 3.4* 3.8 3.4* 3.6 3.8  CL 97* 99 100 97* 97*  CO2 _1 GLUCOSE 100* 94 108* 112* 94  BUN <5* 7* 7* 14 16  CREATININE 0.40* 0.44* 0.42* 0.46* 0.49*  CALCIUM 8.4* 9.3 8.5* 8.8* 9.4  MG  --  1.9  --   --  1.8  PHOS 1.4* 3.4  --   --  2.9   GFR: Estimated Creatinine Clearance: 68.7 mL/min (A) (by C-G formula based on SCr of 0.49 mg/dL (L)). Liver Function Tests: Recent Labs  Lab 08/05/20 0030 08/05/20 0746 08/06/20 0618  AST 61* 42* 57*  ALT 40 29 38  ALKPHOS 103 84 100  BILITOT 1.0 1.0 1.8*  PROT 8.3* 6.1* 7.6  ALBUMIN 4.7 3.5 4.3   Recent Labs  Lab 08/05/20 0030  LIPASE 23   No results for input(s): AMMONIA in the last 168 hours. Coagulation Profile: Recent Labs  Lab 08/05/20 1242  INR 1.0   Cardiac Enzymes: Recent Labs  Lab 08/05/20 0746  CKTOTAL 364   BNP (last 3 results) No results for input(s): PROBNP in the last 8760 hours. HbA1C: No results for input(s): HGBA1C in the last 72 hours. CBG: Recent Labs  Lab 08/06/20 0824  GLUCAP 99   Lipid Profile: No results for input(s): CHOL, HDL, LDLCALC, TRIG, CHOLHDL, LDLDIRECT in the last 72 hours. Thyroid Function Tests: No results for input(s): TSH, T4TOTAL, FREET4, T3FREE, THYROIDAB in the last 72 hours. Anemia Panel: No results for input(s): VITAMINB12, FOLATE, FERRITIN, TIBC, IRON, RETICCTPCT in the last 72 hours. Sepsis Labs: Recent Labs  Lab 08/05/20 0345 08/05/20 1242 08/05/20 1712 08/06/20 0718  PROCALCITON <0.10  --   --   --   LATICACIDVEN 5.6* 2.2* 1.5 1.2    Recent Results (from the past 240 hour(s))  Respiratory Panel by RT PCR (Flu A&B, Covid) - Nasopharyngeal Swab     Status: None   Collection Time: 08/05/20 12:30 AM   Specimen: Nasopharyngeal Swab  Result Value Ref Range Status   SARS Coronavirus  2 by RT PCR NEGATIVE NEGATIVE Final    Comment: (NOTE) SARS-CoV-2 target nucleic acids are NOT DETECTED.  The SARS-CoV-2 RNA is generally detectable in upper respiratoy specimens during the acute phase of infection. The lowest concentration of SARS-CoV-2 viral copies this assay can detect is 131 copies/mL. A negative result does not preclude SARS-Cov-2 infection and should not be used as the sole basis for treatment or other patient management decisions. A negative result may occur with  improper specimen collection/handling, submission of specimen other than nasopharyngeal swab, presence of viral mutation(s) within the areas targeted by this assay, and inadequate number of viral copies (<131 copies/mL). A negative result must be combined with clinical observations, patient history, and epidemiological information. The expected result is Negative.  Fact Sheet for Patients:  PinkCheek.be  Fact Sheet for Healthcare Providers:  GravelBags.it  This test is no t yet approved or cleared by the [place] FDA and  has been authorized for detection and/or diagnosis of SARS-CoV-2 by FDA under an Emergency Use Authorization (EUA). This EUA will remain  in effect (meaning this test can be used) for the duration of the COVID-19 declaration under Section 564(b)(1) of the Act, 21 U.S.C. section 360bbb-3(b)(1), unless the authorization is terminated or revoked sooner.     Influenza A by PCR NEGATIVE NEGATIVE Final   Influenza B by PCR NEGATIVE NEGATIVE Final    Comment: (NOTE) The Xpert Xpress SARS-CoV-2/FLU/RSV assay is intended as an aid in  the diagnosis of influenza from Nasopharyngeal swab specimens and  should not be used as a sole basis for treatment. Nasal washings and  aspirates are unacceptable for Xpert Xpress SARS-CoV-2/FLU/RSV  testing.  Fact Sheet for Patients: PinkCheek.be  Fact Sheet  for Healthcare Providers: GravelBags.it  This test is not yet approved or  cleared by the [place] and  has been authorized for detection and/or diagnosis of SARS-CoV-2 by  FDA under an Emergency Use Authorization (EUA). This EUA will remain  in effect (meaning this test can be used) for the duration of the  Covid-19 declaration under Section 564(b)(1) of the Act, 21  U.S.C. section 360bbb-3(b)(1), unless the authorization is  terminated or revoked. Performed at [facility], [address] [place], [place] [address]   Urine culture     Status: Abnormal   Collection Time: 08/05/20 12:30 AM   Specimen: In/Out Cath Urine  Result Value Ref Range Status   Specimen Description   Final    IN/OUT CATH URINE Performed at [facility] [address], [place], [place] [address]    Special Requests   Final    NONE Performed at [facility], [address] [place], [place] [address]    Culture MULTIPLE SPECIES PRESENT, SUGGEST RECOLLECTION (A)  Final   Report Status 08/06/2020 FINAL  Final  Blood culture (routine x 2)     Status: Abnormal   Collection Time: 08/05/20  4:39 AM   Specimen: BLOOD  Result Value Ref Range Status   Specimen Description   Final    BLOOD LEFT ANTECUBITAL Performed at [facility], [address] [place], [place] [address]    Special Requests   Final    BOTTLES DRAWN AEROBIC AND ANAEROBIC Blood Culture adequate volume Performed at [facility], [address] [place], [place] [address]    Culture  Setup Time   Final    GRAM POSITIVE RODS IN BOTH AEROBIC AND ANAEROBIC BOTTLES CRITICAL RESULT CALLED TO, READ BACK BY AND VERIFIED WITH: SUSAN [name] 08/06/20 AT 1712 BY ACR    Culture (A)  Final    DIPHTHEROIDS(CORYNEBACTERIUM SPECIES) Standardized susceptibility testing for this organism is not available. Performed at [facility], [place] [address] [place],  [place] [address]    Report Status 08/09/2020 FINAL  Final  Blood culture (routine x 2)     Status: None (Preliminary result)   Collection Time: 08/05/20  4:39 AM   Specimen: BLOOD  Result Value Ref Range Status   Specimen Description BLOOD BLOOD LEFT HAND  Final   Special Requests   Final    BOTTLES DRAWN AEROBIC AND ANAEROBIC Blood Culture results may not be optimal due to an inadequate volume of blood received in culture bottles   Culture   Final    NO GROWTH 4 DAYS Performed at [facility], [address] [place], [place] [address]    Report Status PENDING  Incomplete  Culture, blood (single) w Reflex to ID Panel     Status: None (Preliminary result)   Collection Time: 08/08/20  4:09 PM   Specimen: BLOOD  Result Value Ref Range Status   Specimen Description BLOOD BLOOD LEFT HAND  Final   Special Requests   Final    BOTTLES DRAWN AEROBIC AND ANAEROBIC Blood Culture adequate volume   Culture   Final    NO GROWTH < 12 HOURS Performed at [facility], [address], [place], [place] [address]    Report Status PENDING  Incomplete      Imaging Studies   No results found.   Medications   Scheduled Meds: . acidophilus  2 capsule Oral TID  . vitamin C  1,000 mg Oral Daily  . aspirin EC  81 mg Oral Daily  . atenolol  25 mg Oral BID  . baclofen  10 mg Oral TID  . calcium-vitamin D  1 tablet Oral Q breakfast  . cholecalciferol  5,000 Units Oral Daily  . enoxaparin (LOVENOX) injection  40 mg Subcutaneous Q24H  . feeding supplement (ENSURE ENLIVE)  237 mL Oral BID BM  . folic acid  1 mg Oral Daily  . multivitamin with minerals  1 tablet Oral Daily  . pantoprazole  40 mg Oral BID AC  . PARoxetine  30 mg Oral Daily  . thiamine  100 mg Oral Daily   Or  . thiamine  100 mg Intravenous Daily   Continuous Infusions:     LOS: 4 days    Time spent: 30 minutes    Ezekiel Slocumb, DO [facility]  08/09/2020, 2:45 PM    If 7PM-7AM, please contact  night-coverage. How to contact the [facility] Attending or Consulting provider [place] or covering provider during after hours [place], for this patient?    1. Check the care team in [facility] and look for a) attending/consulting TRH provider listed and b) the [facility] team listed 2. Log into [email or web address] and use [place]'s universal password to access. If you do not have the password, please contact the hospital operator. 3. Locate the [facility] provider you are looking for under [facility] and page to a number that you can be directly reached. 4. If you still have difficulty reaching the provider, please page the [facility] (Director on Call) for the Hospitalists listed on amion for assistance.

## 2020-08-09 NOTE — NC FL2 (Signed)
[place] LEVEL OF CARE SCREENING TOOL     IDENTIFICATION  Patient Name: Douglas Peterson. Birthdate: 02-06-1951 Sex: male Admission Date (Current Location): 08/05/2020  [place] and [place] Number:  Engineering geologist and Address:  [facility], [address] [place], [place] [address]      Provider Number: [id number]  Attending Physician Name and Address:  Ezekiel Slocumb, DO  Relative Name and Phone Number:       Current Level of Care: Hospital Recommended Level of Care: [place] Prior Approval Number:    Date Approved/Denied:   PASRR Number: [id number]  Discharge Plan: SNF    Current Diagnoses: Patient Active Problem List   Diagnosis Date Noted  . SIRS (systemic inflammatory response syndrome) ([place]) 08/05/2020  . Right hip pain 08/05/2020  . Hypophosphatemia 08/05/2020  . Closed fracture of distal clavicle_Right 06/05/2020  . GERD (gastroesophageal reflux disease) 06/05/2020  . Alcohol use disorder, severe, in early remission, dependence ([place]) 05/28/2020  . Acquired thrombophilia ([place])   . Elevated LFTs 04/06/2020  . Fall 04/06/2020  . Rhabdomyolysis 04/06/2020  . Elevated lactic acid level 04/06/2020  . Sepsis ([place]) 03/17/2020  . Constipation 03/17/2020  . Paroxysmal A-fib ([place]) 03/17/2020  . Severe protein-calorie malnutrition ([place]) 02/25/2020  . Left inguinal hernia 01/31/2020  . Encounter for competency evaluation   . Depression 01/17/2020  . Esophagitis   . Nausea without vomiting   . Weakness   . Tremor   . Pancytopenia ([place])   . Hypomagnesemia   . Alcoholic cirrhosis of liver without ascites ([place])   . Thrombocytopenia ([place])   . Non-traumatic rhabdomyolysis   . Alcohol withdrawal delirium ([place]) 12/08/2019  . ETOH abuse   . Alcoholic intoxication with complication ([place])   . 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 Hypokalemia 09/27/2019  . Dysphagia  09/27/2019  . Alcohol withdrawal delirium, acute, hyperactive ([place]) 09/21/2019  . Pressure injury of ankle, stage 1 08/15/2019  . Goals of care, counseling/discussion   . Palliative care by specialist   . Alcohol withdrawal ([place]) 03/20/2019  . Low back pain 11/15/2018  . Acute metabolic encephalopathy 27/74/1287  . Delirium tremens ([place]) 03/08/2018  . Malnutrition of moderate degree 11/24/2017  . HTN (hypertension) 09/16/2017  . Hypothermia 12/17/2016  . Alcohol use   . Diarrhea   . Hyponatremia 07/09/2015    Orientation RESPIRATION BLADDER Height & Weight     Self  Normal Continent, External catheter Weight: 122 lb 12.7 oz (55.7 kg) Height:  5' 4"  (162.6 cm)  BEHAVIORAL SYMPTOMS/MOOD NEUROLOGICAL BOWEL NUTRITION STATUS   (None)   Incontinent Diet (DYS 3)  AMBULATORY STATUS COMMUNICATION OF NEEDS Skin   Limited Assist Verbally Normal                       Personal Care Assistance Level of Assistance  Bathing, Feeding, Dressing Bathing Assistance: Maximum assistance Feeding assistance: Limited assistance Dressing Assistance: Maximum assistance     Functional Limitations Info  Sight, Hearing, Speech Sight Info: Adequate Hearing Info: Adequate Speech Info: Adequate    SPECIAL CARE FACTORS FREQUENCY  PT (By licensed PT), OT (By licensed OT)     PT Frequency: 5 x week OT Frequency: 5 x week            Contractures Contractures Info: Present (Right arm)    Additional Factors Info  Code Status, Allergies, Psychotropic Code Status Info: Full code Allergies Info:  Hydrochlorothiazide Psychotropic Info: Depression: Paxil 30 mg PO daily         Current Medications (08/09/2020):  This is the current hospital active medication list Current Facility-Administered Medications  Medication Dose Route Frequency Provider Last Rate Last Admin  . acidophilus (RISAQUAD) capsule 2 capsule  2 capsule Oral TID Nolberto Hanlon, MD   2 capsule at 08/09/20 1002  . ascorbic acid  (VITAMIN C) tablet 1,000 mg  1,000 mg Oral Daily Ivor Costa, MD   1,000 mg at 08/09/20 1001  . aspirin EC tablet 81 mg  81 mg Oral Daily Ivor Costa, MD   81 mg at 08/09/20 1002  . atenolol (TENORMIN) tablet 25 mg  25 mg Oral BID Ivor Costa, MD   25 mg at 08/09/20 1003  . baclofen (LIORESAL) tablet 10 mg  10 mg Oral TID Nolberto Hanlon, MD   10 mg at 08/09/20 1003  . calcium-vitamin D (OSCAL WITH D) 500-200 MG-UNIT per tablet 1 tablet  1 tablet Oral Q breakfast Ivor Costa, MD   1 tablet at 08/09/20 1001  . cholecalciferol (VITAMIN D3) tablet 5,000 Units  5,000 Units Oral Daily Ivor Costa, MD   5,000 Units at 08/09/20 1002  . doxepin (SINEQUAN) capsule 10 mg  10 mg Oral QHS PRN Ivor Costa, MD      . enoxaparin (LOVENOX) injection 40 mg  40 mg Subcutaneous Q24H Ivor Costa, MD   40 mg at 08/08/20 2112  . feeding supplement (ENSURE ENLIVE) (ENSURE ENLIVE) liquid 237 mL  237 mL Oral BID BM Ivor Costa, MD   237 mL at 08/09/20 1009  . folic acid (FOLVITE) tablet 1 mg  1 mg Oral Daily Ivor Costa, MD   1 mg at 08/09/20 1002  . hydrOXYzine (ATARAX/VISTARIL) tablet 25 mg  25 mg Oral TID PRN Ivor Costa, MD   25 mg at 08/06/20 0122  . multivitamin with minerals tablet 1 tablet  1 tablet Oral Daily Ivor Costa, MD   1 tablet at 08/09/20 1002  . ondansetron (ZOFRAN) injection 4 mg  4 mg Intravenous Q8H PRN Ivor Costa, MD   4 mg at 08/07/20 0159  . oxyCODONE (Oxy IR/ROXICODONE) immediate release tablet 5 mg  5 mg Oral Q6H PRN Ivor Costa, MD      . pantoprazole (PROTONIX) EC tablet 40 mg  40 mg Oral BID AC Ivor Costa, MD   40 mg at 08/09/20 1002  . PARoxetine (PAXIL) tablet 30 mg  30 mg Oral Daily Ivor Costa, MD   30 mg at 08/09/20 1003  . thiamine tablet 100 mg  100 mg Oral Daily Ivor Costa, MD   100 mg at 08/09/20 1002   Or  . thiamine (B-1) injection 100 mg  100 mg Intravenous Daily Ivor Costa, MD         Discharge Medications: Please see discharge summary for a list of discharge medications.  Relevant Imaging  Results:  Relevant Lab Results:   Additional Information SS#: [id number]. Was at [facility] in [place] 9/03-0/09. Daughter said he can afford copays.  Candie Chroman, LCSW

## 2020-08-09 NOTE — TOC Progression Note (Addendum)
Transition of Care (TOC) - Progression Note    Patient Details  Name: Douglas Peterson. MRN: [id number] Date of Birth: [birth year]  Transition of Care [facility]) CM/SW Contact  Candie Chroman, LCSW Phone Number: 08/09/2020, 11:52 AM  Clinical Narrative: Per RN, patient still confused. CSW left voicemail for daughter. Will discuss SNF placement when she calls back.    12:47 pm: Received call back from daughter. Discussed SNF recommendation. We decided as long as patient is confused, we will try for SNF placement. If mentation improves, will try for home. Daughter is aware that patient will be in his copay days after 3 days in rehab but says he can afford the copay. She is hoping he will get placement closer to home. CSW called and spoke to his DSS social worker, [name]. They do not have a court date to see if they can get guardianship until 11/23.  Expected Discharge Plan: Skilled Nursing Facility Barriers to Discharge: Continued Medical Work up  Expected Discharge Plan and Services Expected Discharge Plan: [place]     Post Acute Care Choice: [place]                                         Social Determinants of Health (SDOH) Interventions    Readmission Risk Interventions Readmission Risk Prevention Plan 05/19/2020 12/13/2019 12/12/2019  Transportation Screening Complete Complete Complete  PCP or Specialist Appt within 3-5 Days - (No Data) -  HRI or [place] - (No Data) -  Palliative Care Screening - Complete Complete  Medication Review (RN Care Manager) Complete Complete Complete  PCP or Specialist appointment within 3-5 days of discharge Complete - -  [place] or Home Care Consult Complete - -  SW Recovery Care/Counseling Consult Complete - -  Palliative Care Screening Not Applicable - -  [place] Not Applicable - -  Some recent data might be hidden

## 2020-08-10 DIAGNOSIS — R6510 Systemic inflammatory response syndrome (SIRS) of non-infectious origin without acute organ dysfunction: Secondary | ICD-10-CM | POA: Diagnosis not present

## 2020-08-10 DIAGNOSIS — I480 Paroxysmal atrial fibrillation: Secondary | ICD-10-CM | POA: Diagnosis not present

## 2020-08-10 DIAGNOSIS — Z7289 Other problems related to lifestyle: Secondary | ICD-10-CM | POA: Diagnosis not present

## 2020-08-10 DIAGNOSIS — I10 Essential (primary) hypertension: Secondary | ICD-10-CM | POA: Diagnosis not present

## 2020-08-10 LAB — CULTURE, BLOOD (ROUTINE X 2): Culture: NO GROWTH

## 2020-08-10 NOTE — Progress Notes (Signed)
Physical Therapy Treatment Patient Details Name: Douglas Peterson. MRN: [id number] DOB: 1951/08/19 Today's Date: 08/10/2020    History of Present Illness [place] San[place]is. is a [phone] c PMH: HTN, GERD, depression, PAF, ETOH abuse, remote CVA c chronic Rt spastic hemiplegia, recent clavicular fracture (in sling (NWB), who comes to [facility] on 10/3 c generalized weakness and diarrhea.    PT Comments    Pt was long sitting in bed attempting to get OOB I'ly however is struggling and unable. He states he was trying to get his shoes on. Discussed case with DO prior to session. All medical staff recommending SNF at [place]. Lengthy discussion with pt about need for skilled PT in SNF for safety. He eventually states he is willing. He also stated during session "I know I'm not doing so well." he was able to exit L side of bed with increased time and min-mod assist. Stood to quad cane with mod assist and ambulated with min assist at first but mod assist once fatigued. Pt is at high fall risk. At conclusion of session pt is in recliner with call bell in reach and chair alarm in place. HIGHLY recommend SNF at [place] for safety.      Follow Up Recommendations  SNF     Equipment Recommendations  Other (comment) (defer to next level of care)    Recommendations for Other Services       Precautions / Restrictions Precautions Precautions: Fall    Mobility  Bed Mobility Overal bed mobility: Needs Assistance Bed Mobility: Supine to Sit     Supine to sit: Mod assist;HOB elevated     General bed mobility comments: Pt was able to progress from long sitting to short sit EOB with increased time and constant vcs for technique. required mod assist to safely achieve EOB sitting. therapist allowed time for pt to perform without assistance to demonstrate to patient his deficits and need for skilled help.  Transfers Overall transfer level: Needs assistance Equipment used: Quad cane Transfers: Sit to/from Stand Sit to  Stand: Mod assist;From elevated surface         General transfer comment: pt was able to stand from EOB with min assist however unable to stand without mod assist from standard chair height. max vcs for technique and improved fwd wt shift. pt struggles but eventually able to stand. " I'm not doing too well."  Ambulation/Gait Ambulation/Gait assistance: Min assist;Mod assist Gait Distance (Feet): 60 Feet Assistive device: Quad cane Gait Pattern/deviations: Narrow base of support;Trunk flexed;Shuffle;Step-to pattern Gait velocity: decreased   General Gait Details: pt has poor very unsteady gait. constant vcs for improved posture, BOS, and step quality. pt is at extremely high fall risk and required min assist at first but mod assist once fatigued.       Balance Overall balance assessment: Needs assistance Sitting-balance support: Feet supported;Single extremity supported Sitting balance-Leahy Scale: Fair     Standing balance support: Single extremity supported;During functional activity Standing balance-Leahy Scale: Poor Standing balance comment: very high fall risk in standing. have had alot of falls in recent history       Cognition Arousal/Alertness: Awake/alert Behavior During Therapy: WFL for tasks assessed/performed Overall Cognitive Status: Within Functional Limits for tasks assessed      General Comments: Pt A and O x2. asked if we were at [facility]. reoriented pt to hospital setting and gave max encouragement to pt to got to rehab at [place]. discussed at length multiple hospital admissions and need for  help. pt states he knows and said he is willing to go to rehab. [facility] but per discussion with [facility], [facility] will not [facility] for pt leaves AMA last few times.              Pertinent Vitals/Pain Pain Assessment: No/denies pain Pain Intervention(s): Limited activity within patient's tolerance;Monitored during session;Premedicated before  session;Repositioned           PT Goals (current goals can now be found in the care plan section) Acute Rehab PT Goals Patient Stated Goal: go home Progress towards PT goals: Progressing toward goals    Frequency    Min 2X/week      PT Plan Current plan remains appropriate       AM-PAC PT "6 Clicks" Mobility   Outcome Measure  Help needed turning from your back to your side while in a flat bed without using bedrails?: A Little Help needed moving from lying on your back to sitting on the side of a flat bed without using bedrails?: A Lot Help needed moving to and from a bed to a chair (including a wheelchair)?: A Lot Help needed standing up from a chair using your arms (e.g., wheelchair or bedside chair)?: A Lot Help needed to walk in hospital room?: A Lot Help needed climbing 3-5 steps with a railing? : Total 6 Click Score: 12    End of Session Equipment Utilized During Treatment: Gait belt Activity Tolerance: Patient tolerated treatment well Patient left: in chair;with call bell/phone within reach;with chair alarm set;with nursing/sitter in room Nurse Communication: Mobility status PT Visit Diagnosis: Unsteadiness on feet (R26.81);Other abnormalities of gait and mobility (R26.89);Repeated falls (R29.6);Muscle weakness (generalized) (M62.81);History of falling (Z91.81);Difficulty in walking, not elsewhere classified (R26.2)     Time: 7989-2119 PT Time Calculation (min) (ACUTE ONLY): 30 min  Charges:  $Gait Training: 8-22 mins $Therapeutic Activity: 8-22 mins                     Julaine Fusi PTA 08/10/20, 10:05 AM

## 2020-08-10 NOTE — Progress Notes (Signed)
PROGRESS NOTE    Douglas Peterson.   AJG:[id number]  DOB: Nov 22, 1950  PCP: [name], MD    DOA: 08/05/2020 LOS: 5   Brief Narrative   Douglas Peterson. is a 69 y.o. male with medical history significant of hypertension, GERD, depression, PAF, alcohol abuse, chronic right-sided weakness presented to the ED due to generalized weakness and diarrhea. Pt continues to drink alcohol heavily on daily basis. Covid was negative. Toxicology was negative. Alcohol level on admission was 268.     Assessment & Plan   Principal Problem:   SIRS (systemic inflammatory response syndrome) (HCC) Active Problems:   Hyponatremia   Alcohol use   Diarrhea   HTN (hypertension)   Malnutrition of moderate degree   Depression   Sepsis (HCC)   Paroxysmal A-fib (HCC)   GERD (gastroesophageal reflux disease)   Right hip pain   Hypophosphatemia   SIRS present on admission  Sepsis ruled out.   Patient met criteria for SIRS with leukocytosis with WBC 20.7, tachycardia with heart rate of 116, tachypnea with RR 22.  Lactic acid 4.8 -->5.6. on admission, likely due to hypovolemia/dehydration. Lactic acid and leukocytosis have improved. UA and chest xray negative. 1 blood culture grew Corynebacterium sp, suspect contaminate.  Has been on Vanc/Cefepime since admission - stopped antibiotics 10/6 and monitor clinically. Repeat blood culture pending - neg to date. Patient remains asymptomatic from infection standpoint.   Right hip pain - X-ray of right hip negative for fracture, but it showed degenerative changes.  PRN pain meds.   PT OT-recommend SNF, placement pending.   Hyponatremia - most likely due to alcohol abuse, poor PO intake, dehydration and beer potomania. Initial Na was 129.   Resolved with IV hydration.  Off IV fluids since 10/6.   Acute metabolic encephalopathy - likely multifactorial, due to EtOH withdrawal, dehydration. Slowly improving.   Continue supportive care.    Uncomplicated  alcohol withdrawal - resolved Alcohol abuse with dependence - CIWA protocol with PRN Ativan.  Taper off Librium.   Diarrhea - reported on admission, no further episodes reported.   Stool studies pending but no BM - can cancel.   Hypertension - stable, continue atenolol     Malnutrition of moderate degree - continue nutrition supplements   Depression - continue paroxetine, Vistaril PRN   Paroxysmal A-fib - not on chronic anticoagulants at home.  Continue atenolol.   GERD - continue Protonix   Hypokalemia - replaced.  Monitor BMP, Mg and replace as needed.     DVT prophylaxis: enoxaparin (LOVENOX) injection 40 mg Start: 08/05/20 2200   Diet:  Diet Orders (From admission, onward)    Start     Ordered   08/07/20 1206  DIET DYS 3 Room service appropriate? No; Fluid consistency: Thin  Diet effective now       Question Answer Comment  Room service appropriate? No   Fluid consistency: Thin      08/07/20 1205            Code Status: Full Code    Subjective 08/10/20    Patient seen at bedside this AM.  Asks why he is here and says he wants to go home, not rehab.  Says he's lived alone 30 years and will do fine once he's up and moving.  Denies any acute complaints including F/C, N/V/D, CP or SOB.   Disposition Plan & Communication   Status is: Inpatient  Remains inpatient appropriate because: Unsafe d/c plan.  [name] profoundly  weak, unsafe to return home alone.  SNF placement pending.   Dispo: The patient is from: Home              Anticipated d/c is to: SNF              Anticipated d/c date is: 1 day              Patient currently IS medically stable for d/c.  Family Communication: none at bedside, will attempt to call.     Consults, Procedures, Significant Events   Consultants:   None  Procedures:   None  Antimicrobials:  Anti-infectives (From admission, onward)   Start     Dose/Rate Route Frequency Ordered Stop   08/05/20 1800  vancomycin  (VANCOREADY) IVPB 750 mg/150 mL  Status:  Discontinued        750 mg 150 mL/hr over 60 Minutes Intravenous Every 12 hours 08/05/20 0804 08/08/20 1159   08/05/20 1400  ceFEPIme (MAXIPIME) 2 g in sodium chloride 0.9 % 100 mL IVPB  Status:  Discontinued        2 g 200 mL/hr over 30 Minutes Intravenous Every 8 hours 08/05/20 0804 08/08/20 1159   08/05/20 0445  vancomycin (VANCOCIN) IVPB 1000 mg/200 mL premix        1,000 mg 200 mL/hr over 60 Minutes Intravenous  Once 08/05/20 0430 08/05/20 0728   08/05/20 0430  ceFEPIme (MAXIPIME) 1 g in sodium chloride 0.9 % 100 mL IVPB        1 g 200 mL/hr over 30 Minutes Intravenous  Once 08/05/20 0430 08/05/20 0611        Objective   Vitals:   08/09/20 2058 08/10/20 0002 08/10/20 0519 08/10/20 1206  BP: 120/69 116/77 109/83 (!) 141/94  Pulse: 68 70 67 76  Resp: 20 16 20 20   Temp: 98 F (36.7 C) 98.6 F (37 C) 97.8 F (36.6 C) 97.6 F (36.4 C)  TempSrc: Oral Oral  Oral  SpO2: 94% 95% 97% 95%  Weight:      Height:        Intake/Output Summary (Last 24 hours) at 08/10/2020 1318 Last data filed at 08/10/2020 1300 Gross per 24 hour  Intake 717 ml  Output 1150 ml  Net -433 ml   Filed Weights   08/05/20 0026 08/06/20 2205  Weight: 54.4 kg 55.7 kg    Physical Exam:  General exam: awake, alert, no acute distress Respiratory system: CTAB, normal respiratory effort. Cardiovascular system: normal S1/S2, RRR, no pedal edema.   Gastrointestinal system: soft, NT, ND, +bowel sounds. Extremities: no cyanosis, hypertonic right upper extremity stable  Labs   Data Reviewed: I have personally reviewed following labs and imaging studies  CBC: Recent Labs  Lab 08/05/20 0030 08/06/20 0718 08/07/20 0528  WBC 20.7* 6.5 6.1  NEUTROABS 8.1*  --   --   HGB 16.5 15.3 13.9  HCT 46.7 43.5 38.6*  MCV 84.9 85.6 86.0  PLT 232 106* 488*   Basic Metabolic Panel: Recent Labs  Lab 08/05/20 0746 08/06/20 0618 08/07/20 0528 08/08/20 0449  08/09/20 0551  NA 137 138 136 135 135  K 3.4* 3.8 3.4* 3.6 3.8  CL 97* 99 100 97* 97*  CO2 30 24 28 29 29   GLUCOSE 100* 94 108* 112* 94  BUN <5* 7* 7* 14 16  CREATININE 0.40* 0.44* 0.42* 0.46* 0.49*  CALCIUM 8.4* 9.3 8.5* 8.8* 9.4  MG  --  1.9  --   --  1.8  PHOS 1.4* 3.4  --   --  2.9   GFR: Estimated Creatinine Clearance: 68.7 mL/min (A) (by C-G formula based on SCr of 0.49 mg/dL (L)). Liver Function Tests: Recent Labs  Lab 08/05/20 0030 08/05/20 0746 08/06/20 0618  AST 61* 42* 57*  ALT 40 29 38  ALKPHOS 103 84 100  BILITOT 1.0 1.0 1.8*  PROT 8.3* 6.1* 7.6  ALBUMIN 4.7 3.5 4.3   Recent Labs  Lab 08/05/20 0030  LIPASE 23   No results for input(s): AMMONIA in the last 168 hours. Coagulation Profile: Recent Labs  Lab 08/05/20 1242  INR 1.0   Cardiac Enzymes: Recent Labs  Lab 08/05/20 0746  CKTOTAL 364   BNP (last 3 results) No results for input(s): PROBNP in the last 8760 hours. HbA1C: No results for input(s): HGBA1C in the last 72 hours. CBG: Recent Labs  Lab 08/06/20 0824  GLUCAP 99   Lipid Profile: No results for input(s): CHOL, HDL, LDLCALC, TRIG, CHOLHDL, LDLDIRECT in the last 72 hours. Thyroid Function Tests: No results for input(s): TSH, T4TOTAL, FREET4, T3FREE, THYROIDAB in the last 72 hours. Anemia Panel: No results for input(s): VITAMINB12, FOLATE, FERRITIN, TIBC, IRON, RETICCTPCT in the last 72 hours. Sepsis Labs: Recent Labs  Lab 08/05/20 0345 08/05/20 1242 08/05/20 1712 08/06/20 0718  PROCALCITON <0.10  --   --   --   LATICACIDVEN 5.6* 2.2* 1.5 1.2    Recent Results (from the past 240 hour(s))  Respiratory Panel by RT PCR (Flu A&B, Covid) - Nasopharyngeal Swab     Status: None   Collection Time: 08/05/20 12:30 AM   Specimen: Nasopharyngeal Swab  Result Value Ref Range Status   SARS Coronavirus 2 by RT PCR NEGATIVE NEGATIVE Final    Comment: (NOTE) SARS-CoV-2 target nucleic acids are NOT DETECTED.  The SARS-CoV-2 RNA is  generally detectable in upper respiratoy specimens during the acute phase of infection. The lowest concentration of SARS-CoV-2 viral copies this assay can detect is 131 copies/mL. A negative result does not preclude SARS-Cov-2 infection and should not be used as the sole basis for treatment or other patient management decisions. A negative result may occur with  improper specimen collection/handling, submission of specimen other than nasopharyngeal swab, presence of viral mutation(s) within the areas targeted by this assay, and inadequate number of viral copies (<131 copies/mL). A negative result must be combined with clinical observations, patient history, and epidemiological information. The expected result is Negative.  Fact Sheet for Patients:  PinkCheek.be  Fact Sheet for Healthcare Providers:  GravelBags.it  This test is no t yet approved or cleared by the [place] FDA and  has been authorized for detection and/or diagnosis of SARS-CoV-2 by FDA under an Emergency Use Authorization (EUA). This EUA will remain  in effect (meaning this test can be used) for the duration of the COVID-19 declaration under Section 564(b)(1) of the Act, 21 U.S.C. section 360bbb-3(b)(1), unless the authorization is terminated or revoked sooner.     Influenza A by PCR NEGATIVE NEGATIVE Final   Influenza B by PCR NEGATIVE NEGATIVE Final    Comment: (NOTE) The Xpert Xpress SARS-CoV-2/FLU/RSV assay is intended as an aid in  the diagnosis of influenza from Nasopharyngeal swab specimens and  should not be used as a sole basis for treatment. Nasal washings and  aspirates are unacceptable for Xpert Xpress SARS-CoV-2/FLU/RSV  testing.  Fact Sheet for Patients: PinkCheek.be  Fact Sheet for Healthcare Providers: GravelBags.it  This test is not yet approved or cleared  by the [place] and  has been authorized for detection and/or diagnosis of SARS-CoV-2 by  FDA under an Emergency Use Authorization (EUA). This EUA will remain  in effect (meaning this test can be used) for the duration of the  Covid-19 declaration under Section 564(b)(1) of the Act, 21  U.S.C. section 360bbb-3(b)(1), unless the authorization is  terminated or revoked. Performed at [facility], [address], [place], [place] [address]   Urine culture     Status: Abnormal   Collection Time: 08/05/20 12:30 AM   Specimen: In/Out Cath Urine  Result Value Ref Range Status   Specimen Description   Final    IN/OUT CATH URINE Performed at [facility], [address] [place], [place] [address]    Special Requests   Final    NONE Performed at [facility], [address] [place], [place] [address]    Culture MULTIPLE SPECIES PRESENT, SUGGEST RECOLLECTION (A)  Final   Report Status 08/06/2020 FINAL  Final  Blood culture (routine x 2)     Status: Abnormal   Collection Time: 08/05/20  4:39 AM   Specimen: BLOOD  Result Value Ref Range Status   Specimen Description   Final    BLOOD LEFT ANTECUBITAL Performed at [facility], [address] [place], [place] [address]    Special Requests   Final    BOTTLES DRAWN AEROBIC AND ANAEROBIC Blood Culture adequate volume Performed at [facility], [address] [place], [place] [address]    Culture  Setup Time   Final    GRAM POSITIVE RODS IN BOTH AEROBIC AND ANAEROBIC BOTTLES CRITICAL RESULT CALLED TO, READ BACK BY AND VERIFIED WITH: SUSAN [name] 08/06/20 AT 1712 BY ACR    Culture (A)  Final    DIPHTHEROIDS(CORYNEBACTERIUM SPECIES) Standardized susceptibility testing for this organism is not available. Performed at [facility], [place] [address], [place], [place] [address]    Report Status 08/09/2020 FINAL  Final  Blood culture (routine x 2)     Status: None   Collection Time: 08/05/20   4:39 AM   Specimen: BLOOD  Result Value Ref Range Status   Specimen Description BLOOD BLOOD LEFT HAND  Final   Special Requests   Final    BOTTLES DRAWN AEROBIC AND ANAEROBIC Blood Culture results may not be optimal due to an inadequate volume of blood received in culture bottles   Culture   Final    NO GROWTH 5 DAYS Performed at [facility], [address] [place], [place] [address]    Report Status 08/10/2020 FINAL  Final  Culture, blood (single) w Reflex to ID Panel     Status: None (Preliminary result)   Collection Time: 08/08/20  4:09 PM   Specimen: BLOOD  Result Value Ref Range Status   Specimen Description BLOOD BLOOD LEFT HAND  Final   Special Requests   Final    BOTTLES DRAWN AEROBIC AND ANAEROBIC Blood Culture adequate volume   Culture   Final    NO GROWTH 2 DAYS Performed at [facility], [address], [place], [place] [address]    Report Status PENDING  Incomplete      Imaging Studies   No results found.   Medications   Scheduled Meds: . acidophilus  2 capsule Oral TID  . vitamin C  1,000 mg Oral Daily  . aspirin EC  81 mg Oral Daily  . atenolol  25 mg  Oral BID  . baclofen  10 mg Oral TID  . calcium-vitamin D  1 tablet Oral Q breakfast  . cholecalciferol  5,000 Units Oral Daily  . enoxaparin (LOVENOX) injection  40 mg Subcutaneous Q24H  . feeding supplement (ENSURE ENLIVE)  237 mL Oral BID BM  . folic acid  1 mg Oral Daily  . multivitamin with minerals  1 tablet Oral Daily  . pantoprazole  40 mg Oral BID AC  . PARoxetine  30 mg Oral Daily  . thiamine  100 mg Oral Daily   Or  . thiamine  100 mg Intravenous Daily   Continuous Infusions:     LOS: 5 days    Time spent: 15 minutes    Ezekiel Slocumb, DO [facility]  08/10/2020, 1:18 PM    If 7PM-7AM, please contact night-coverage. How to contact the [facility] Attending or Consulting provider [place] or covering provider during after hours [place], for this patient?     1. Check the care team in [facility] and look for a) attending/consulting TRH provider listed and b) the [facility] team listed 2. Log into [email or web address] and use [place]'s universal password to access. If you do not have the password, please contact the hospital operator. 3. Locate the [facility] provider you are looking for under [facility] and page to a number that you can be directly reached. 4. If you still have difficulty reaching the provider, please page the [facility] (Director on Call) for the Hospitalists listed on amion for assistance.

## 2020-08-10 NOTE — Progress Notes (Signed)
Mobility Specialist - Progress Note   08/10/20 1658  Mobility  Activity Ambulated to bathroom  Level of Assistance Standby assist, set-up cues, supervision of patient - no hands on  Assistive Device Four point cane  Distance Ambulated (ft) 20 ft  Mobility Response Tolerated well  Mobility performed by Mobility specialist  $Mobility charge 1 Mobility    Pre-mobility: 100 HR, 98% SpO2 During mobility: 109 HR, 97% SpO2 Post-mobility: 92 HR, 96% SpO2   Pt was sitting in recliner finishing up OT session. Pt agreed to mobility. Pt was SBA in all transfers this date. Pt utilized 4-point cane for session, but stated he prefers SPC. Pt was able to ambulate to the bathroom where he had BM. Pt needed extra time, but was independent in completing hygiene tasks. Overall, pt tolerated session well. Pt was left in recliner with all needs in reach and alarm set.   Kathee Delton Mobility Specialist 08/10/20, 5:02 PM

## 2020-08-10 NOTE — TOC Progression Note (Signed)
Transition of Care (TOC) - Progression Note    Patient Details  Name: Douglas Peterson. MRN: [id number] Date of Birth: [birth year]  Transition of Care [facility]) CM/SW [place], LCSW Phone Number: 08/10/2020, 4:36 PM  Clinical Narrative:   Per PT this morning, patient more oriented and agreeable to SNF. CSW went to room to discuss bed offers but mobility tech was in there and said he was in the bathroom.  Expected Discharge Plan: Skilled Nursing Facility Barriers to Discharge: Continued Medical Work up  Expected Discharge Plan and Services Expected Discharge Plan: [place]     Post Acute Care Choice: [place]                                         Social Determinants of Health (SDOH) Interventions    Readmission Risk Interventions Readmission Risk Prevention Plan 05/19/2020 12/13/2019 12/12/2019  Transportation Screening Complete Complete Complete  PCP or Specialist Appt within 3-5 Days - (No Data) -  [place] or [place] - (No Data) -  Palliative Care Screening - Complete Complete  Medication Review (RN Care Manager) Complete Complete Complete  PCP or Specialist appointment within 3-5 days of discharge Complete - -  [place] or Home Care Consult Complete - -  SW Recovery Care/Counseling Consult Complete - -  Palliative Care Screening Not Applicable - -  [place] Not Applicable - -  Some recent data might be hidden

## 2020-08-10 NOTE — Care Management Important Message (Signed)
Important Message  Patient Details  Name: Douglas Peterson. MRN: [id number] Date of Birth: Mar 21, 1951   Medicare Important Message Given:  Yes     [name] 08/10/2020, 11:38 AM

## 2020-08-10 NOTE — Progress Notes (Signed)
Occupational Therapy Treatment Patient Details Name: Rollie Hynek. MRN: [id number] DOB: [birth year] Today's Date: 08/10/2020    History of present illness Tiberius Loftus. is a [phone] c PMH: HTN, GERD, depression, PAF, ETOH abuse, remote CVA c chronic Rt spastic hemiplegia, recent clavicular fracture (in sling (NWB), who comes to [facility] on 10/3 c generalized weakness and diarrhea.   OT comments  Pt seen for OT tx this date to f/u re: safety with ADLs/ADL mobility. OT educates re: importance of stretching to maintain joint and connective tissue integrity. Pt with moderate reception and particpatory in stretching lower body to engage in LB dressing ADLs. Pt requires MOD A with LB dressing to doff/don socks. OT engages pt in sit to stand with MIN/MOD A from slightly elevated bed surface with use of QC. OT facilitates MIN A with MIN verbal cues for pt to take ~4-5 steps from EOB to reclining chair. Pt left in recliner as mobility specialist presents to engage pt in fxl mobility. Continue to anticipate SNF is best d/c recommendation given pt's need for assistance relative to PLOF and limited social support.    Follow Up Recommendations  SNF;Supervision/Assistance - 24 hour    Equipment Recommendations  Other (comment) (defer to next venue of care)    Recommendations for Other Services      Precautions / Restrictions Precautions Precautions: Fall       Mobility Bed Mobility Overal bed mobility: Needs Assistance Bed Mobility: Supine to Sit     Supine to sit: Min assist;HOB elevated        Transfers Overall transfer level: Needs assistance Equipment used: Quad cane Transfers: Sit to/from Stand Sit to Stand: Min assist;Mod assist;From elevated surface              Balance Overall balance assessment: Needs assistance;History of Falls Sitting-balance support: Feet supported;Single extremity supported Sitting balance-Leahy Scale: Fair     Standing balance support: Single  extremity supported;During functional activity Standing balance-Leahy Scale: Poor Standing balance comment: high fall risk, requires L UE support and MIN A at least to sustain balance with steps to chair.                           ADL either performed or assessed with clinical judgement   ADL Overall ADL's : Needs assistance/impaired                     Lower Body Dressing: Moderate assistance;Sitting/lateral leans Lower Body Dressing Details (indicate cue type and reason): to doff socks and don clean ones in sitting. Requires extended time, difficulty threading over toes.     Toileting- Clothing Manipulation and Hygiene: Set up;Bed level Toileting - Clothing Manipulation Details (indicate cue type and reason): to use urinal to void in bed     Functional mobility during ADLs: Minimal assistance;Cane (to take 4-5 steps from EOB to reclining chair ~5' away)       Vision Patient Visual Report: No change from baseline     Perception     Praxis      Cognition Arousal/Alertness: Awake/alert Behavior During Therapy: WFL for tasks assessed/performed Overall Cognitive Status: Within Functional Limits for tasks assessed                                 General Comments: somewhat drowsy at start of session, but overall approriate. Requires being re-oriented to  situation. Pt able to be easily re-oriented.        Exercises Other Exercises Other Exercises: OT educates re: importance of stretching to maintain joint and connective tissue integrity. Pt with moderate reception and particpatory in stretching lower body to engage in LB dressing ADLs. Pt requires MOD A with LB dressing to doff/don socks. OT engages pt in sit to stand with MIN/MOD A from slightly elevated bed surface with use of QC. OT facilitates MIN A with MIN verbal cues for pt to take ~4-5 steps from EOB to reclining chair. Pt left in recliner as mobility specialist presents to engage pt in fxl  mobility.   Shoulder Instructions       General Comments      Pertinent Vitals/ Pain       Pain Assessment: No/denies pain  Home Living                                          Prior Functioning/Environment              Frequency  Min 1X/week        Progress Toward Goals  OT Goals(current goals can now be found in the care plan section)  Progress towards OT goals: Progressing toward goals  Acute Rehab OT Goals Patient Stated Goal: go home Time For Goal Achievement: 08/20/20 Potential to Achieve Goals: [place] Discharge plan remains appropriate    Co-evaluation                 AM-PAC OT "6 Clicks" Daily Activity     Outcome Measure   Help from another person eating meals?: None Help from another person taking care of personal grooming?: A Little Help from another person toileting, which includes using toliet, bedpan, or urinal?: A Lot Help from another person bathing (including washing, rinsing, drying)?: A Lot Help from another person to put on and taking off regular upper body clothing?: A Lot Help from another person to put on and taking off regular lower body clothing?: A Lot 6 Click Score: 15    End of Session    OT Visit Diagnosis: Other abnormalities of gait and mobility (R26.89);Muscle weakness (generalized) (M62.81);History of falling (Z91.81);Repeated falls (R29.6)   Activity Tolerance Patient tolerated treatment well   Patient Left in bed;with call bell/phone within reach   Nurse Communication Mobility status        Time: 2694-8546 OT Time Calculation (min): 16 min  Charges: OT General Charges $OT Visit: 1 Visit OT Treatments $Self Care/Home Management : 8-22 mins  Gerrianne Scale, Chambers, OTR/L ascom [phone] 08/10/20, 4:43 PM

## 2020-08-11 ENCOUNTER — Encounter: Payer: Self-pay | Admitting: Internal Medicine

## 2020-08-11 DIAGNOSIS — R6510 Systemic inflammatory response syndrome (SIRS) of non-infectious origin without acute organ dysfunction: Secondary | ICD-10-CM | POA: Diagnosis not present

## 2020-08-11 DIAGNOSIS — E871 Hypo-osmolality and hyponatremia: Secondary | ICD-10-CM | POA: Diagnosis not present

## 2020-08-11 DIAGNOSIS — Z7289 Other problems related to lifestyle: Secondary | ICD-10-CM | POA: Diagnosis not present

## 2020-08-11 DIAGNOSIS — I10 Essential (primary) hypertension: Secondary | ICD-10-CM | POA: Diagnosis not present

## 2020-08-11 MED ORDER — THIAMINE HCL 100 MG PO TABS
100.0000 mg | ORAL_TABLET | Freq: Three times a day (TID) | ORAL | Status: DC
  Administered 2020-08-11 – 2020-08-13 (×7): 100 mg via ORAL
  Filled 2020-08-11 (×5): qty 1

## 2020-08-11 MED ORDER — THIAMINE HCL 100 MG/ML IJ SOLN: 100.0000 mg | Freq: Three times a day (TID) | INTRAMUSCULAR | Status: DC

## 2020-08-11 MED ORDER — SENNOSIDES-DOCUSATE SODIUM 8.6-50 MG PO TABS
2.0000 | ORAL_TABLET | Freq: Every day | ORAL | Status: DC
  Administered 2020-08-11 – 2020-08-13 (×3): 2 via ORAL
  Filled 2020-08-11 (×3): qty 2

## 2020-08-11 NOTE — Progress Notes (Addendum)
PROGRESS NOTE    Douglas Peterson.   BSW:[id number]  DOB: [birth year]  PCP: [name], MD    DOA: 08/05/2020 LOS: 6   Brief Narrative   Douglas Hammitt. is a 69 y.o. male with medical history significant of hypertension, GERD, depression, PAF, alcohol abuse, chronic right-sided weakness presented to the ED due to generalized weakness and diarrhea. Pt continues to drink alcohol heavily on daily basis. Covid was negative. Toxicology was negative. Alcohol level on admission was 268.     Assessment & Plan   Principal Problem:   SIRS (systemic inflammatory response syndrome) (HCC) Active Problems:   Hyponatremia   Alcohol use   Diarrhea   HTN (hypertension)   Malnutrition of moderate degree   Depression   Sepsis (HCC)   Paroxysmal A-fib (HCC)   GERD (gastroesophageal reflux disease)   Right hip pain   Hypophosphatemia   SIRS present on admission  Sepsis ruled out.   Patient met criteria for SIRS with leukocytosis with WBC 20.7, tachycardia with heart rate of 116, tachypnea with RR 22.  Lactic acid 4.8 -->5.6. on admission, likely due to hypovolemia/dehydration. Lactic acid and leukocytosis have improved. UA and chest xray negative. 1 blood culture grew Corynebacterium sp, suspect contaminate.  Has been on Vanc/Cefepime since admission - stopped antibiotics 10/6 and monitor clinically. Repeat blood culture pending - neg to date. Patient remains asymptomatic from infection standpoint.   Right hip pain - X-ray of right hip negative for fracture, but it showed degenerative changes.  PRN pain meds.   PT OT-recommend SNF, placement pending.   Hyponatremia - most likely due to alcohol abuse, poor PO intake, dehydration and beer potomania. Initial Na was 129.   Resolved with IV hydration.  Off IV fluids since 10/6.   Acute metabolic encephalopathy - likely multifactorial, due to EtOH withdrawal, dehydration. Slowly improving.   Continue supportive care.    Uncomplicated  alcohol withdrawal - resolved Alcohol abuse with dependence - CIWA protocol with PRN Ativan.  Tapered off Librium. --thiamine and folic acid supplements --increase thiamine to 100 mg TID WC for 2 weeks, then 100 mg daily   Diarrhea - reported on admission, no further episodes reported.   Stool studies pending but no BM - can cancel.   Hypertension - stable, continue atenolol     Malnutrition of moderate degree - continue nutrition supplements   Depression - continue paroxetine, Vistaril PRN   Paroxysmal A-fib - not on chronic anticoagulants at home.  Continue atenolol.   GERD - continue Protonix   Hypokalemia - replaced.  Monitor BMP, Mg and replace as needed.     DVT prophylaxis: enoxaparin (LOVENOX) injection 40 mg Start: 08/05/20 2200   Diet:  Diet Orders (From admission, onward)    Start     Ordered   08/07/20 1206  DIET DYS 3 Room service appropriate? No; Fluid consistency: Thin  Diet effective now       Question Answer Comment  Room service appropriate? No   Fluid consistency: Thin      08/07/20 1205            Code Status: Full Code    Subjective 08/11/20    Patient seen at bedside this AM.  He says he feels quite weak and fatigued.  Appetite is good, no nausea/vomiting.  Also reports his hoarse voice is not normal for him, but denies any sore or scratchy feeling throat.  No fever/chills, SOB, CP, or other complaints   Disposition Plan &  Communication   Status is: Inpatient  Remains inpatient appropriate because: Unsafe d/c plan.  [name] profoundly weak, unsafe to return home alone.  SNF placement pending.   Dispo: The patient is from: Home              Anticipated d/c is to: SNF              Anticipated d/c date is: 1 day              Patient currently IS medically stable for d/c.  Family Communication: none at bedside, will attempt to call.     Consults, Procedures, Significant Events   Consultants:   None  Procedures:    None  Antimicrobials:  Anti-infectives (From admission, onward)   Start     Dose/Rate Route Frequency Ordered Stop   08/05/20 1800  vancomycin (VANCOREADY) IVPB 750 mg/150 mL  Status:  Discontinued        750 mg 150 mL/hr over 60 Minutes Intravenous Every 12 hours 08/05/20 0804 08/08/20 1159   08/05/20 1400  ceFEPIme (MAXIPIME) 2 g in sodium chloride 0.9 % 100 mL IVPB  Status:  Discontinued        2 g 200 mL/hr over 30 Minutes Intravenous Every 8 hours 08/05/20 0804 08/08/20 1159   08/05/20 0445  vancomycin (VANCOCIN) IVPB 1000 mg/200 mL premix        1,000 mg 200 mL/hr over 60 Minutes Intravenous  Once 08/05/20 0430 08/05/20 0728   08/05/20 0430  ceFEPIme (MAXIPIME) 1 g in sodium chloride 0.9 % 100 mL IVPB        1 g 200 mL/hr over 30 Minutes Intravenous  Once 08/05/20 0430 08/05/20 0611        Objective   Vitals:   08/11/20 0417 08/11/20 1008 08/11/20 1213 08/11/20 1527  BP: 109/90 (!) 135/93 (!) 154/89 136/82  Pulse: 74 66 72 77  Resp: _0 Temp: (!) 97.3 F (36.3 C) 97.8 F (36.6 C) (!) 97.5 F (36.4 C) 98.6 F (37 C)  TempSrc: Oral Oral Oral Oral  SpO2: 97% 96% 93% 93%  Weight:      Height:        Intake/Output Summary (Last 24 hours) at 08/11/2020 1600 Last data filed at 08/11/2020 1531 Gross per 24 hour  Intake 477 ml  Output 725 ml  Net -248 ml   Filed Weights   08/05/20 0026 08/06/20 2205  Weight: 54.4 kg 55.7 kg    Physical Exam:  General exam: awake, alert, no acute distress Respiratory system: CTAB, normal respiratory effort. Cardiovascular system: normal S1/S2, RRR, no pedal edema.   Gastrointestinal system: soft, NT, ND, +bowel sounds. Extremities: no cyanosis, hypertonic right upper extremity stable  Labs   Data Reviewed: I have personally reviewed following labs and imaging studies  CBC: Recent Labs  Lab 08/05/20 0030 08/06/20 0718 08/07/20 0528  WBC 20.7* 6.5 6.1  NEUTROABS 8.1*  --   --   HGB 16.5 15.3 13.9  HCT 46.7  43.5 38.6*  MCV 84.9 85.6 86.0  PLT 232 106* 877*   Basic Metabolic Panel: Recent Labs  Lab 08/05/20 0746 08/06/20 0618 08/07/20 0528 08/08/20 0449 08/09/20 0551  NA 137 138 136 135 135  K 3.4* 3.8 3.4* 3.6 3.8  CL 97* 99 100 97* 97*  CO2 _1 GLUCOSE 100* 94 108* 112* 94  BUN <5* 7* 7* 14 16  CREATININE 0.40* 0.44* 0.42* 0.46*  0.49*  CALCIUM 8.4* 9.3 8.5* 8.8* 9.4  MG  --  1.9  --   --  1.8  PHOS 1.4* 3.4  --   --  2.9   GFR: Estimated Creatinine Clearance: 68.7 mL/min (A) (by C-G formula based on SCr of 0.49 mg/dL (L)). Liver Function Tests: Recent Labs  Lab 08/05/20 0030 08/05/20 0746 08/06/20 0618  AST 61* 42* 57*  ALT 40 29 38  ALKPHOS 103 84 100  BILITOT 1.0 1.0 1.8*  PROT 8.3* 6.1* 7.6  ALBUMIN 4.7 3.5 4.3   Recent Labs  Lab 08/05/20 0030  LIPASE 23   No results for input(s): AMMONIA in the last 168 hours. Coagulation Profile: Recent Labs  Lab 08/05/20 1242  INR 1.0   Cardiac Enzymes: Recent Labs  Lab 08/05/20 0746  CKTOTAL 364   BNP (last 3 results) No results for input(s): PROBNP in the last 8760 hours. HbA1C: No results for input(s): HGBA1C in the last 72 hours. CBG: Recent Labs  Lab 08/06/20 0824  GLUCAP 99   Lipid Profile: No results for input(s): CHOL, HDL, LDLCALC, TRIG, CHOLHDL, LDLDIRECT in the last 72 hours. Thyroid Function Tests: No results for input(s): TSH, T4TOTAL, FREET4, T3FREE, THYROIDAB in the last 72 hours. Anemia Panel: No results for input(s): VITAMINB12, FOLATE, FERRITIN, TIBC, IRON, RETICCTPCT in the last 72 hours. Sepsis Labs: Recent Labs  Lab 08/05/20 0345 08/05/20 1242 08/05/20 1712 08/06/20 0718  PROCALCITON <0.10  --   --   --   LATICACIDVEN 5.6* 2.2* 1.5 1.2    Recent Results (from the past 240 hour(s))  Respiratory Panel by RT PCR (Flu A&B, Covid) - Nasopharyngeal Swab     Status: None   Collection Time: 08/05/20 12:30 AM   Specimen: Nasopharyngeal Swab  Result Value Ref Range  Status   SARS Coronavirus 2 by RT PCR NEGATIVE NEGATIVE Final    Comment: (NOTE) SARS-CoV-2 target nucleic acids are NOT DETECTED.  The SARS-CoV-2 RNA is generally detectable in upper respiratoy specimens during the acute phase of infection. The lowest concentration of SARS-CoV-2 viral copies this assay can detect is 131 copies/mL. A negative result does not preclude SARS-Cov-2 infection and should not be used as the sole basis for treatment or other patient management decisions. A negative result may occur with  improper specimen collection/handling, submission of specimen other than nasopharyngeal swab, presence of viral mutation(s) within the areas targeted by this assay, and inadequate number of viral copies (<131 copies/mL). A negative result must be combined with clinical observations, patient history, and epidemiological information. The expected result is Negative.  Fact Sheet for Patients:  PinkCheek.be  Fact Sheet for Healthcare Providers:  GravelBags.it  This test is no t yet approved or cleared by the [place] FDA and  has been authorized for detection and/or diagnosis of SARS-CoV-2 by FDA under an Emergency Use Authorization (EUA). This EUA will remain  in effect (meaning this test can be used) for the duration of the COVID-19 declaration under Section 564(b)(1) of the Act, 21 U.S.C. section 360bbb-3(b)(1), unless the authorization is terminated or revoked sooner.     Influenza A by PCR NEGATIVE NEGATIVE Final   Influenza B by PCR NEGATIVE NEGATIVE Final    Comment: (NOTE) The Xpert Xpress SARS-CoV-2/FLU/RSV assay is intended as an aid in  the diagnosis of influenza from Nasopharyngeal swab specimens and  should not be used as a sole basis for treatment. Nasal washings and  aspirates are unacceptable for Xpert Xpress SARS-CoV-2/FLU/RSV  testing.  Fact Sheet for  Patients: PinkCheek.be  Fact Sheet for Healthcare Providers: GravelBags.it  This test is not yet approved or cleared by the [place] FDA and  has been authorized for detection and/or diagnosis of SARS-CoV-2 by  FDA under an Emergency Use Authorization (EUA). This EUA will remain  in effect (meaning this test can be used) for the duration of the  Covid-19 declaration under Section 564(b)(1) of the Act, 21  U.S.C. section 360bbb-3(b)(1), unless the authorization is  terminated or revoked. Performed at [facility], [address], [place], [place] [address]   Urine culture     Status: Abnormal   Collection Time: 08/05/20 12:30 AM   Specimen: In/Out Cath Urine  Result Value Ref Range Status   Specimen Description   Final    IN/OUT CATH URINE Performed at [facility], [address] [place], [place] [address]    Special Requests   Final    NONE Performed at [facility], [address] [place], [place] [address]    Culture MULTIPLE SPECIES PRESENT, SUGGEST RECOLLECTION (A)  Final   Report Status 08/06/2020 FINAL  Final  Blood culture (routine x 2)     Status: Abnormal   Collection Time: 08/05/20  4:39 AM   Specimen: BLOOD  Result Value Ref Range Status   Specimen Description   Final    BLOOD LEFT ANTECUBITAL Performed at [facility], [address] [place], [place] [address]    Special Requests   Final    BOTTLES DRAWN AEROBIC AND ANAEROBIC Blood Culture adequate volume Performed at [facility], [address] [place], [place] [address]    Culture  Setup Time   Final    GRAM POSITIVE RODS IN BOTH AEROBIC AND ANAEROBIC BOTTLES CRITICAL RESULT CALLED TO, READ BACK BY AND VERIFIED WITH: SUSAN [name] 08/06/20 AT 1712 BY ACR    Culture (A)  Final    DIPHTHEROIDS(CORYNEBACTERIUM SPECIES) Standardized susceptibility testing for this organism is not  available. Performed at [facility], [place] [address], [place], [place] [address]    Report Status 08/09/2020 FINAL  Final  Blood culture (routine x 2)     Status: None   Collection Time: 08/05/20  4:39 AM   Specimen: BLOOD  Result Value Ref Range Status   Specimen Description BLOOD BLOOD LEFT HAND  Final   Special Requests   Final    BOTTLES DRAWN AEROBIC AND ANAEROBIC Blood Culture results may not be optimal due to an inadequate volume of blood received in culture bottles   Culture   Final    NO GROWTH 5 DAYS Performed at [facility], [address] [place], [place] [address]    Report Status 08/10/2020 FINAL  Final  Culture, blood (single) w Reflex to ID Panel     Status: None (Preliminary result)   Collection Time: 08/08/20  4:09 PM   Specimen: BLOOD  Result Value Ref Range Status   Specimen Description BLOOD BLOOD LEFT HAND  Final   Special Requests   Final    BOTTLES DRAWN AEROBIC AND ANAEROBIC Blood Culture adequate volume   Culture   Final    NO GROWTH 2 DAYS Performed at [facility], [address], [place],  [address]    Report Status PENDING  Incomplete      Imaging Studies   No results found.   Medications   Scheduled Meds: . acidophilus  2 capsule Oral TID  . vitamin  C  1,000 mg Oral Daily  . aspirin EC  81 mg Oral Daily  . atenolol  25 mg Oral BID  . baclofen  10 mg Oral TID  . calcium-vitamin D  1 tablet Oral Q breakfast  . cholecalciferol  5,000 Units Oral Daily  . enoxaparin (LOVENOX) injection  40 mg Subcutaneous Q24H  . feeding supplement (ENSURE ENLIVE)  237 mL Oral BID BM  . folic acid  1 mg Oral Daily  . multivitamin with minerals  1 tablet Oral Daily  . pantoprazole  40 mg Oral BID AC  . PARoxetine  30 mg Oral Daily  . thiamine  100 mg Oral TID with meals   Or  . thiamine  100 mg Intravenous TID with meals   Continuous Infusions:     LOS: 6 days    Time spent: 15 minutes    Ezekiel Slocumb,  DO [facility]  08/11/2020, 4:00 PM    If 7PM-7AM, please contact night-coverage. How to contact the [facility] Attending or Consulting provider [place] or covering provider during after hours [place], for this patient?    1. Check the care team in [facility] and look for a) attending/consulting [facility] provider listed and b) the [facility] team listed 2. Log into [email or web address] and use [place]'s universal password to access. If you do not have the password, please contact the hospital operator. 3. Locate the [facility] provider you are looking for under [facility] and page to a number that you can be directly reached. 4. If you still have difficulty reaching the provider, please page the [facility] (Director on Call) for the Hospitalists listed on amion for assistance.

## 2020-08-12 DIAGNOSIS — E440 Moderate protein-calorie malnutrition: Secondary | ICD-10-CM

## 2020-08-12 DIAGNOSIS — Z7289 Other problems related to lifestyle: Secondary | ICD-10-CM | POA: Diagnosis not present

## 2020-08-12 DIAGNOSIS — K2100 Gastro-esophageal reflux disease with esophagitis, without bleeding: Secondary | ICD-10-CM | POA: Diagnosis not present

## 2020-08-12 DIAGNOSIS — I480 Paroxysmal atrial fibrillation: Secondary | ICD-10-CM | POA: Diagnosis not present

## 2020-08-12 LAB — CREATININE, SERUM
Creatinine, Ser: 0.51 mg/dL — ABNORMAL LOW (ref 0.61–1.24)
GFR, Estimated: >60 mL/min (ref >60)

## 2020-08-12 MED ORDER — BISACODYL 5 MG PO TBEC
5.0000 mg | DELAYED_RELEASE_TABLET | Freq: Every day | ORAL | Status: DC | PRN
  Administered 2020-08-12: 5 mg via ORAL
  Filled 2020-08-12: qty 1

## 2020-08-12 NOTE — Progress Notes (Signed)
PROGRESS NOTE    Douglas Peterson.   UVO:[id number]  DOB: 09-26-1951  PCP: [name], MD    DOA: 08/05/2020 LOS: 7   Brief Narrative   Douglas Makin. is a 69 y.o. male with medical history significant of hypertension, GERD, depression, PAF, alcohol abuse, chronic right-sided weakness presented to the ED due to generalized weakness and diarrhea. Pt continues to drink alcohol heavily on daily basis. Covid was negative. Toxicology was negative. Alcohol level on admission was 268.     Assessment & Plan   Principal Problem:   SIRS (systemic inflammatory response syndrome) (HCC) Active Problems:   Hyponatremia   Alcohol use   Diarrhea   HTN (hypertension)   Malnutrition of moderate degree   Depression   Sepsis (HCC)   Paroxysmal A-fib (HCC)   GERD (gastroesophageal reflux disease)   Right hip pain   Hypophosphatemia   SIRS present on admission  Sepsis ruled out.   Patient met criteria for SIRS with leukocytosis with WBC 20.7, tachycardia with heart rate of 116, tachypnea with RR 22.  Lactic acid 4.8 -->5.6. on admission, likely due to hypovolemia/dehydration. Lactic acid and leukocytosis have improved. UA and chest xray negative. 1 blood culture grew Corynebacterium sp, suspect contaminate.  Has been on Vanc/Cefepime since admission - stopped antibiotics 10/6 and monitor clinically. Repeat blood culture pending - neg to date. Patient remains asymptomatic from infection standpoint.   Right hip pain - X-ray of right hip negative for fracture, but it showed degenerative changes.  PRN pain meds.   PT OT-recommend SNF, placement pending.   Hyponatremia - most likely due to alcohol abuse, poor PO intake, dehydration and beer potomania. Initial Na was 129.   Resolved with IV hydration.  Off IV fluids since 10/6.   Acute metabolic encephalopathy - likely multifactorial, due to EtOH withdrawal, dehydration. Slowly improving.   Continue supportive care.    Uncomplicated  alcohol withdrawal - resolved Alcohol abuse with dependence - CIWA protocol with PRN Ativan.  Tapered off Librium. --thiamine and folic acid supplements --increase thiamine to 100 mg TID WC for 2 weeks, then 100 mg daily   Diarrhea - reported on admission, no further episodes reported.   Stool studies pending but no BM - can cancel.  Hx of CVA with R-sided weakness - stable.  Patient's voice has been soft, poor vocal projection, worse than his usual baseline.  Does report this happens every time he stops drinking, as he recovers strength, it improves.  Has been steadily improving over past several days from barely whisper to now can hear and understand him from across the room. --would consider CT neck soft tissue if worsening    Hypertension - stable, continue atenolol     Malnutrition of moderate degree - continue nutrition supplements   Depression - continue paroxetine, Vistaril PRN   Paroxysmal A-fib - not on chronic anticoagulants at home.  Continue atenolol.   GERD - continue Protonix   Hypokalemia - replaced.  Monitor BMP, Mg and replace as needed.     DVT prophylaxis: enoxaparin (LOVENOX) injection 40 mg Start: 08/05/20 2200   Diet:  Diet Orders (From admission, onward)    Start     Ordered   08/07/20 1206  DIET DYS 3 Room service appropriate? No; Fluid consistency: Thin  Diet effective now       Question Answer Comment  Room service appropriate? No   Fluid consistency: Thin      08/07/20 1205  Code Status: Full Code    Subjective 08/12/20    Patient seen at bedside this AM.  Reports he had BM's yesterday and this AM.  Says his voice not yet normal for him, but getting better and this is the usual pattern that happens once he gets sober. Voice gets better as he recovers and regains strength.  No sore throat, fever or chills.  Say he is finished drinking alcohol, does not want to start back drinking again.  He does ask about going home instead of to  rehab.   Disposition Plan & Communication   Status is: Inpatient  Remains inpatient appropriate because: Unsafe d/c plan.  [name] profoundly weak, unsafe to return home alone.  SNF placement pending.   Dispo: The patient is from: Home              Anticipated d/c is to: SNF              Anticipated d/c date is: 1 day              Patient currently IS medically stable for d/c.  Family Communication: none at bedside, will attempt to call.     Consults, Procedures, Significant Events   Consultants:   None  Procedures:   None  Antimicrobials:  Anti-infectives (From admission, onward)   Start     Dose/Rate Route Frequency Ordered Stop   08/05/20 1800  vancomycin (VANCOREADY) IVPB 750 mg/150 mL  Status:  Discontinued        750 mg 150 mL/hr over 60 Minutes Intravenous Every 12 hours 08/05/20 0804 08/08/20 1159   08/05/20 1400  ceFEPIme (MAXIPIME) 2 g in sodium chloride 0.9 % 100 mL IVPB  Status:  Discontinued        2 g 200 mL/hr over 30 Minutes Intravenous Every 8 hours 08/05/20 0804 08/08/20 1159   08/05/20 0445  vancomycin (VANCOCIN) IVPB 1000 mg/200 mL premix        1,000 mg 200 mL/hr over 60 Minutes Intravenous  Once 08/05/20 0430 08/05/20 0728   08/05/20 0430  ceFEPIme (MAXIPIME) 1 g in sodium chloride 0.9 % 100 mL IVPB        1 g 200 mL/hr over 30 Minutes Intravenous  Once 08/05/20 0430 08/05/20 0611        Objective   Vitals:   08/11/20 2326 08/12/20 0500 08/12/20 0915 08/12/20 1143  BP: 117/68 130/77 132/88 127/78  Pulse: 73 76 74 69  Resp: _0 Temp: 98 F (36.7 C) 98.5 F (36.9 C) 98.5 F (36.9 C) 97.9 F (36.6 C)  TempSrc: Oral Oral Oral Oral  SpO2: 96% 95% 96% 95%  Weight:      Height:        Intake/Output Summary (Last 24 hours) at 08/12/2020 1451 Last data filed at 08/12/2020 1429 Gross per 24 hour  Intake 1191 ml  Output 625 ml  Net 566 ml   Filed Weights   08/05/20 0026 08/06/20 2205  Weight: 54.4 kg 55.7 kg    Physical  Exam:  General exam: awake, alert, no acute distress HEENT: no masses, lymphadenopathy or thyromegaly palpated, hearing grossly normal Respiratory system: CTAB, normal respiratory effort. Cardiovascular system: normal S1/S2, RRR, no pedal edema.   Extremities: no cyanosis, hypertonic right upper extremity stable  Labs   Data Reviewed: I have personally reviewed following labs and imaging studies  CBC: Recent Labs  Lab 08/06/20 0718 08/07/20 0528  WBC 6.5 6.1  HGB  15.3 13.9  HCT 43.5 38.6*  MCV 85.6 86.0  PLT 106* 226*   Basic Metabolic Panel: Recent Labs  Lab 08/06/20 0618 08/07/20 0528 08/08/20 0449 08/09/20 0551 08/12/20 0454  NA 138 136 135 135  --   K 3.8 3.4* 3.6 3.8  --   CL 99 100 97* 97*  --   CO2 _0 --   GLUCOSE 94 108* 112* 94  --   BUN 7* 7* 14 16  --   CREATININE 0.44* 0.42* 0.46* 0.49* 0.51*  CALCIUM 9.3 8.5* 8.8* 9.4  --   MG 1.9  --   --  1.8  --   PHOS 3.4  --   --  2.9  --    GFR: Estimated Creatinine Clearance: 68.7 mL/min (A) (by C-G formula based on SCr of 0.51 mg/dL (L)). Liver Function Tests: Recent Labs  Lab 08/06/20 0618  AST 57*  ALT 38  ALKPHOS 100  BILITOT 1.8*  PROT 7.6  ALBUMIN 4.3   No results for input(s): LIPASE, AMYLASE in the last 168 hours. No results for input(s): AMMONIA in the last 168 hours. Coagulation Profile: No results for input(s): INR, PROTIME in the last 168 hours. Cardiac Enzymes: No results for input(s): CKTOTAL, CKMB, CKMBINDEX, TROPONINI in the last 168 hours. BNP (last 3 results) No results for input(s): PROBNP in the last 8760 hours. HbA1C: No results for input(s): HGBA1C in the last 72 hours. CBG: Recent Labs  Lab 08/06/20 0824  GLUCAP 99   Lipid Profile: No results for input(s): CHOL, HDL, LDLCALC, TRIG, CHOLHDL, LDLDIRECT in the last 72 hours. Thyroid Function Tests: No results for input(s): TSH, T4TOTAL, FREET4, T3FREE, THYROIDAB in the last 72 hours. Anemia Panel: No results  for input(s): VITAMINB12, FOLATE, FERRITIN, TIBC, IRON, RETICCTPCT in the last 72 hours. Sepsis Labs: Recent Labs  Lab 08/05/20 1712 08/06/20 0718  LATICACIDVEN 1.5 1.2    Recent Results (from the past 240 hour(s))  Respiratory Panel by RT PCR (Flu A&B, Covid) - Nasopharyngeal Swab     Status: None   Collection Time: 08/05/20 12:30 AM   Specimen: Nasopharyngeal Swab  Result Value Ref Range Status   SARS Coronavirus 2 by RT PCR NEGATIVE NEGATIVE Final    Comment: (NOTE) SARS-CoV-2 target nucleic acids are NOT DETECTED.  The SARS-CoV-2 RNA is generally detectable in upper respiratoy specimens during the acute phase of infection. The lowest concentration of SARS-CoV-2 viral copies this assay can detect is 131 copies/mL. A negative result does not preclude SARS-Cov-2 infection and should not be used as the sole basis for treatment or other patient management decisions. A negative result may occur with  improper specimen collection/handling, submission of specimen other than nasopharyngeal swab, presence of viral mutation(s) within the areas targeted by this assay, and inadequate number of viral copies (<131 copies/mL). A negative result must be combined with clinical observations, patient history, and epidemiological information. The expected result is Negative.  Fact Sheet for Patients:  PinkCheek.be  Fact Sheet for Healthcare Providers:  GravelBags.it  This test is no t yet approved or cleared by the [place] FDA and  has been authorized for detection and/or diagnosis of SARS-CoV-2 by FDA under an Emergency Use Authorization (EUA). This EUA will remain  in effect (meaning this test can be used) for the duration of the COVID-19 declaration under Section 564(b)(1) of the Act, 21 U.S.C. section 360bbb-3(b)(1), unless the authorization is terminated or revoked sooner.     Influenza  A by PCR NEGATIVE NEGATIVE Final     Influenza B by PCR NEGATIVE NEGATIVE Final    Comment: (NOTE) The Xpert Xpress SARS-CoV-2/FLU/RSV assay is intended as an aid in  the diagnosis of influenza from Nasopharyngeal swab specimens and  should not be used as a sole basis for treatment. Nasal washings and  aspirates are unacceptable for Xpert Xpress SARS-CoV-2/FLU/RSV  testing.  Fact Sheet for Patients: PinkCheek.be  Fact Sheet for Healthcare Providers: GravelBags.it  This test is not yet approved or cleared by the [place] FDA and  has been authorized for detection and/or diagnosis of SARS-CoV-2 by  FDA under an Emergency Use Authorization (EUA). This EUA will remain  in effect (meaning this test can be used) for the duration of the  Covid-19 declaration under Section 564(b)(1) of the Act, 21  U.S.C. section 360bbb-3(b)(1), unless the authorization is  terminated or revoked. Performed at [facility], [address], [place], [place] [address]   Urine culture     Status: Abnormal   Collection Time: 08/05/20 12:30 AM   Specimen: In/Out Cath Urine  Result Value Ref Range Status   Specimen Description   Final    IN/OUT CATH URINE Performed at [facility], [address], [place], [place] [address]    Special Requests   Final    NONE Performed at [facility], [address] [place], [place] [address]    Culture MULTIPLE SPECIES PRESENT, SUGGEST RECOLLECTION (A)  Final   Report Status 08/06/2020 FINAL  Final  Blood culture (routine x 2)     Status: Abnormal   Collection Time: 08/05/20  4:39 AM   Specimen: BLOOD  Result Value Ref Range Status   Specimen Description   Final    BLOOD LEFT ANTECUBITAL Performed at [facility], [address], [place], [place] [address]    Special Requests   Final    BOTTLES DRAWN AEROBIC AND ANAEROBIC Blood Culture adequate volume Performed at [facility], [address] [place], [place] [address]    Culture  Setup Time   Final    GRAM POSITIVE RODS IN BOTH AEROBIC AND ANAEROBIC BOTTLES CRITICAL RESULT CALLED TO, READ BACK BY AND VERIFIED WITH: SUSAN [name] 08/06/20 AT 1712 BY ACR    Culture (A)  Final    DIPHTHEROIDS(CORYNEBACTERIUM SPECIES) Standardized susceptibility testing for this organism is not available. Performed at [facility], [place] [address] [place], [place] [address]    Report Status 08/09/2020 FINAL  Final  Blood culture (routine x 2)     Status: None   Collection Time: 08/05/20  4:39 AM   Specimen: BLOOD  Result Value Ref Range Status   Specimen Description BLOOD BLOOD LEFT HAND  Final   Special Requests   Final    BOTTLES DRAWN AEROBIC AND ANAEROBIC Blood Culture results may not be optimal due to an inadequate volume of blood received in culture bottles   Culture   Final    NO GROWTH 5 DAYS Performed at [facility], [address] [place], [place] [address]    Report Status 08/10/2020 FINAL  Final  Culture, blood (single) w Reflex to ID Panel     Status: None (Preliminary result)   Collection Time: 08/08/20  4:09 PM   Specimen: BLOOD  Result Value Ref Range Status   Specimen Description BLOOD BLOOD LEFT HAND  Final   Special Requests   Final    BOTTLES DRAWN AEROBIC AND  ANAEROBIC Blood Culture adequate volume   Culture   Final    NO GROWTH 4 DAYS Performed at [facility], [address] [place], [name] [address]    Report Status PENDING  Incomplete      Imaging Studies   No results found.   Medications   Scheduled Meds: . acidophilus  2 capsule Oral TID  . vitamin C  1,000 mg Oral Daily  . aspirin EC  81 mg Oral Daily  . atenolol  25 mg Oral BID  . baclofen  10 mg Oral TID  . calcium-vitamin D  1 tablet Oral Q breakfast  . cholecalciferol  5,000 Units Oral Daily  . enoxaparin (LOVENOX) injection  40 mg Subcutaneous Q24H  . feeding supplement (ENSURE ENLIVE)  237 mL  Oral BID BM  . folic acid  1 mg Oral Daily  . multivitamin with minerals  1 tablet Oral Daily  . pantoprazole  40 mg Oral BID AC  . PARoxetine  30 mg Oral Daily  . senna-docusate  2 tablet Oral QHS  . thiamine  100 mg Oral TID with meals   Or  . thiamine  100 mg Intravenous TID with meals   Continuous Infusions:     LOS: 7 days    Time spent: 15 minutes    Ezekiel Slocumb, DO [facility]  08/12/2020, 2:51 PM    If 7PM-7AM, please contact night-coverage. How to contact the [facility] Attending or Consulting provider [place] or covering provider during after hours [place], for this patient?    1. Check the care team in [facility] and look for a) attending/consulting TRH provider listed and b) the [facility] team listed 2. Log into [email or web address] and use [place]'s universal password to access. If you do not have the password, please contact the hospital operator. 3. Locate the [facility] provider you are looking for under [facility] and page to a number that you can be directly reached. 4. If you still have difficulty reaching the provider, please page the [facility] (Director on Call) for the Hospitalists listed on amion for assistance.

## 2020-08-12 NOTE — Progress Notes (Signed)
Pt complaining of constipation. LBM 08/06/20. NP notified and senokot ordered BID.

## 2020-08-13 DIAGNOSIS — Z7289 Other problems related to lifestyle: Secondary | ICD-10-CM | POA: Diagnosis not present

## 2020-08-13 DIAGNOSIS — I480 Paroxysmal atrial fibrillation: Secondary | ICD-10-CM | POA: Diagnosis not present

## 2020-08-13 DIAGNOSIS — K2100 Gastro-esophageal reflux disease with esophagitis, without bleeding: Secondary | ICD-10-CM | POA: Diagnosis not present

## 2020-08-13 DIAGNOSIS — I10 Essential (primary) hypertension: Secondary | ICD-10-CM | POA: Diagnosis not present

## 2020-08-13 LAB — BASIC METABOLIC PANEL
Anion gap: 10 (ref 5–15)
BUN: 19 mg/dL (ref 8–23)
CO2: 29 mmol/L (ref 22–32)
Calcium: 9.1 mg/dL (ref 8.9–10.3)
Chloride: 101 mmol/L (ref 98–111)
Creatinine, Ser: 0.52 mg/dL — ABNORMAL LOW (ref 0.61–1.24)
GFR, Estimated: >60 mL/min (ref >60)
Glucose, Bld: 106 mg/dL — ABNORMAL HIGH (ref 70–99)
Potassium: 3.8 mmol/L (ref 3.5–5.1)
Sodium: 140 mmol/L (ref 135–145)

## 2020-08-13 LAB — CULTURE, BLOOD (SINGLE)
Culture: NO GROWTH
Special Requests: ADEQUATE

## 2020-08-13 LAB — MAGNESIUM: Magnesium: 2 mg/dL (ref 1.7–2.4)

## 2020-08-13 NOTE — Care Management Important Message (Signed)
Important Message  Patient Details  Name: Douglas Peterson. MRN: [id number] Date of Birth: 1951/09/02   Medicare Important Message Given:  Yes     [name] 08/13/2020, 12:03 PM

## 2020-08-13 NOTE — Progress Notes (Signed)
Mobility Specialist - Progress Note   08/13/20 1300  Mobility  Activity Ambulated in hall  Level of Assistance Contact guard assist, steadying assist  Assistive Device Four point cane  Distance Ambulated (ft) 200 ft  Mobility Response Tolerated well  Mobility performed by Mobility specialist  $Mobility charge 1 Mobility    Pre-mobility: 84 HR, 96% SpO2 During mobility: 85 HR, 97% SpO2 Post-mobility: 80 HR, 97% SpO2   Pt was lying in bed upon arrival. Pt agreed to session. Pt was modA getting EOB and performing STS. Pt was able to don on shoes without physical assistance but required extra time d/t limitations in R arm. Pt ambulated 1 lap around nursing station with CGA. Pt presented with unsteady/shaky gait this date, but no noted LOB. No heavy breathing noted and pt denied SOB as O2 remained >95% throughout session. Pt is motivated to ambulate later this evening as he stated that he "is ready to go home." Overall, pt tolerated session well. Pt was left in recliner with all needs in reach and alarm set. Nurse entered room at the end of session and was notified of performance.    Kathee Delton Mobility Specialist 08/13/20, 1:35 PM

## 2020-08-13 NOTE — Progress Notes (Signed)
Occupational Therapy Treatment Patient Details Name: Douglas Peterson. MRN: [id number] DOB: 09-15-1951 Today's Date: 08/13/2020    History of present illness Mendel Binsfeld. is a [phone] c PMH: HTN, GERD, depression, PAF, ETOH abuse, remote CVA c chronic Rt spastic hemiplegia, recent clavicular fracture (in sling (NWB), who comes to [facility] on 10/3 c generalized weakness and diarrhea.   OT comments  Pt supine in bed and resting upon entering the room. Pt needing encouragement to participate secondary to fatigue. Pt needing min cuing for safety awareness with functional transfers. Pt standing with min A from EOB and ambulating with min HHA to sink. Pt standing for 20 minutes to brush teeth and wash self with min A for standing balance. Pt seated on edge of bed to don B socks with one handed technique and figure four position. Pt needing increased time secondary to fatigue and rest breaks. Pt declines SNF recommendation and states he will not go. Pt is agreeable to [facility] with encouragement. Pt seated in recliner chair with all needs within reach. Pt continues to benefit from acute OT intervention.   Follow Up Recommendations  Home health OT;Supervision - Intermittent    Equipment Recommendations  None recommended by OT       Precautions / Restrictions Precautions Precautions: Fall Restrictions Weight Bearing Restrictions: No       Mobility Bed Mobility Overal bed mobility: Needs Assistance Bed Mobility: Supine to Sit     Supine to sit: HOB elevated;Min guard Sit to supine: Min assist   General bed mobility comments: min A for trunk support with min cuing for technique and hand placement  Transfers Overall transfer level: Needs assistance Equipment used: 1 person hand held assist Transfers: Sit to/from Stand Sit to Stand: Min guard      Balance Overall balance assessment: Needs assistance;History of Falls Sitting-balance support: Feet supported;Single extremity  supported Sitting balance-Leahy Scale: Good     Standing balance support: Single extremity supported;During functional activity Standing balance-Leahy Scale: Poor Standing balance comment: requires L UE support and min A to sustain balance      ADL either performed or assessed with clinical judgement   ADL Overall ADL's : Needs assistance/impaired     Grooming: Wash/dry hands;Wash/dry face;Oral care;Standing;Min guard   Upper Body Bathing: Min guard;Standing   Lower Body Bathing: Minimal assistance;Sit to/from stand       Lower Body Dressing: Minimal assistance;Sitting/lateral leans      General ADL Comments: Pt standing at sink for ~ 20 minutes for bathing tasks at min guard- min A overall for balance with self care tasks.     Vision Patient Visual Report: No change from baseline            Cognition Arousal/Alertness: Awake/alert Behavior During Therapy: WFL for tasks assessed/performed Overall Cognitive Status: Within Functional Limits for tasks assessed       General Comments: somewhat drowsy at start of session, but overall approriate. Requires being re-oriented to situation. Pt able to be easily re-oriented.                   Pertinent Vitals/ Pain       Pain Assessment: No/denies pain         Frequency  Min 1X/week        Progress Toward Goals  OT Goals(current goals can now be found in the care plan section)  Progress towards OT goals: Progressing toward goals  Acute Rehab OT Goals Patient Stated Goal: go home OT  Goal Formulation: With patient Time For Goal Achievement: 08/20/20 Potential to Achieve Goals: [place] Discharge plan remains appropriate       AM-PAC OT "6 Clicks" Daily Activity     Outcome Measure   Help from another person eating meals?: None Help from another person taking care of personal grooming?: A Little Help from another person toileting, which includes using toliet, bedpan, or urinal?: A Little Help from  another person bathing (including washing, rinsing, drying)?: A Little Help from another person to put on and taking off regular upper body clothing?: A Little Help from another person to put on and taking off regular lower body clothing?: A Little 6 Click Score: 19    End of Session    OT Visit Diagnosis: Other abnormalities of gait and mobility (R26.89);Muscle weakness (generalized) (M62.81);History of falling (Z91.81);Repeated falls (R29.6)   Activity Tolerance Patient tolerated treatment well   Patient Left in bed;with call bell/phone within reach   Nurse Communication Mobility status        Time: 8377-9396 OT Time Calculation (min): 38 min  Charges: OT General Charges $OT Visit: 1 Visit OT Treatments $Self Care/Home Management : 38-52 mins  Darleen Crocker, MS, OTR/L , CBIS ascom [phone]  08/13/20, 4:31 PM

## 2020-08-13 NOTE — Progress Notes (Signed)
Order received from Dr Arbutus Ped to discontinue CIWA and telemetry

## 2020-08-13 NOTE — TOC Progression Note (Addendum)
Transition of Care (TOC) - Progression Note    Patient Details  Name: Laquinn Shippy. MRN: [id number] Date of Birth: 1951/03/13  Transition of Care [facility]) CM/SW Contact  Candie Chroman, LCSW Phone Number: 08/13/2020, 10:08 AM  Clinical Narrative: CSW met with patient to provide SNF bed offer. Patient feels stronger since admission and feels like he would be able to go home. He says he's going to the bathroom on his own. Sent secure chat to PT and OT to let them know and see if they can work with him today. If he does still need SNF, [facility] in [place] is first preference. CSW made him aware of daily copays after 3 days. CSW provided some substance abuse counseling and told him that DSS is working on pursuing guardianship. This shocked and worried patient so CSW encouraged him to prove DSS wrong and show them that he can take care of himself. Patient reports that he does not want to drink anymore.     4:21 pm: PT said patient walked 225 feet but was still unsteady so they will keep their recommendation as SNF. CSW met with patient to discuss. OT had just finished working with him as well. Patient still wants to return home. He is agreeable to home health services. [facility] is reviewing referral.  4:41 pm: [facility] unable to accept patient until he can show that he is not drinking anymore. [facility], [facility], [facility], and [facility] unable to accept. Left messages for [facility] and [facility]. Asked [facility] to check and see if they can do PT, OT, and social work. They definitely cannot provide nursing.  Expected Discharge Plan: Skilled Nursing Facility Barriers to Discharge: Continued Medical Work up  Expected Discharge Plan and Services Expected Discharge Plan: [place]     Post Acute Care Choice: [place]                                         Social Determinants of Health (SDOH) Interventions    Readmission Risk  Interventions Readmission Risk Prevention Plan 05/19/2020 12/13/2019 12/12/2019  Transportation Screening Complete Complete Complete  PCP or Specialist Appt within 3-5 Days - (No Data) -  HRI or [place] - (No Data) -  Palliative Care Screening - Complete Complete  Medication Review (RN Care Manager) Complete Complete Complete  PCP or Specialist appointment within 3-5 days of discharge Complete - -  [place] or Home Care Consult Complete - -  SW Recovery Care/Counseling Consult Complete - -  Palliative Care Screening Not Applicable - -  [place] Not Applicable - -  Some recent data might be hidden

## 2020-08-13 NOTE — Progress Notes (Signed)
Physical Therapy Treatment Patient Details Name: Douglas Peterson. MRN: [id number] DOB: [birth year] Today's Date: 08/13/2020    History of Present Illness Zymeir Salminen. is a [phone] c PMH: HTN, GERD, depression, PAF, ETOH abuse, remote CVA c chronic Rt spastic hemiplegia, recent clavicular fracture (in sling (NWB), who comes to [facility] on 10/3 c generalized weakness and diarrhea.    PT Comments    Pt was pleasant and motivated to participate during the session. Pt required Min guard for cueing to perform bed mobility and come to sitting EOB. PT was able to perform STS with min A for balance and safety. Pt was able to ambulate 225' with SPC and Mod A. [name] gait was very unsteady and pt required ModA at times to prevent LOB. [name] gait had greatly decreased step length and displayed some moderate posterior lurching to make up for muscle weakness. Pt was able to ascend and descend 4 stairs x3. Pt had a posterior lean and often caught R toes on steps when ascending. Pt was unsteady while turning and required ModA to prevent LOB. Pt will benefit from PT services in a SNF setting upon discharge to safely address deficits listed in patient problem list for decreased caregiver assistance and eventual return to PLOF.    Follow Up Recommendations  SNF     Equipment Recommendations  Other (comment) (defer to next level of care)    Recommendations for Other Services       Precautions / Restrictions Precautions Precautions: Fall Restrictions Weight Bearing Restrictions: No    Mobility  Bed Mobility Overal bed mobility: Needs Assistance Bed Mobility: Supine to Sit     Supine to sit: HOB elevated;Min guard Sit to supine: Supervision   General bed mobility comments: min A for trunk support with min cuing for technique and hand placement  Transfers Overall transfer level: Needs assistance Equipment used: Straight cane Transfers: Sit to/from Stand Sit to Stand: Min assist          General transfer comment: pt able to stand with increased effort and needs Min A for balance  Ambulation/Gait Ambulation/Gait assistance: Mod assist Gait Distance (Feet): 225 Feet x1, 60 feet x1 Assistive device: Straight cane Gait Pattern/deviations: Decreased stride length;Decreased weight shift to right Gait velocity: decreased   General Gait Details: pt posteriorly leans while walking. pt has low foot clearance and greatly decreased stride length. [name] R LE fails to fully stabalize at times. PT on multiple occasions provided ModA support to prevent loss of balance. Pt displays limited ability to walk and talk at the same time and gait speed slows significantly while talking   Stairs Stairs: Yes Stairs assistance: Mod assist Stair Management: Step to pattern Number of Stairs: 4 General stair comments: pt displayed decreased ability to move R LE on stairs. Pt often caught R toes on steps while ascending/ descending steps. pt also tended to have a posterior lean and required cueing for sequencing. At several points pt requited PT mod A to precent LOB   Wheelchair Mobility    Modified Rankin (Stroke Patients Only)       Balance Overall balance assessment: Needs assistance;History of Falls Sitting-balance support: Feet supported;Single extremity supported Sitting balance-Leahy Scale: Good     Standing balance support: Single extremity supported;During functional activity Standing balance-Leahy Scale: Fair Standing balance comment: pt able to maintain static stance but can not tolerate challenge to balance  Cognition Arousal/Alertness: Awake/alert Behavior During Therapy: WFL for tasks assessed/performed Overall Cognitive Status: Within Functional Limits for tasks assessed                                 General Comments: somewhat drowsy at start of session, but overall approriate. Requires being re-oriented to situation.  Pt able to be easily re-oriented.      Exercises Other Exercises Other Exercises: pt performs moving objects across bedside table while standing to facilitate dynamic balance x 3 minutes Other Exercises: pt recieves perturbations from PT while in standing x2 minutes    General Comments        Pertinent Vitals/Pain Pain Assessment: No/denies pain    Home Living Family/patient expects to be discharged to:: Private residence Living Arrangements: Alone Available Help at Discharge: Family;Other (Comment) (sister currently out of town and has no one to help upon discharge) Type of Home: House Home Access: Stairs to enter            Prior Function            PT Goals (current goals can now be found in the care plan section) Acute Rehab PT Goals Patient Stated Goal: go home Time For Goal Achievement: 08/20/20 Progress towards PT goals: Progressing toward goals    Frequency    Min 2X/week      PT Plan Current plan remains appropriate    Co-evaluation              AM-PAC PT "6 Clicks" Mobility   Outcome Measure  Help needed turning from your back to your side while in a flat bed without using bedrails?: A Little Help needed moving from lying on your back to sitting on the side of a flat bed without using bedrails?: A Little Help needed moving to and from a bed to a chair (including a wheelchair)?: A Little Help needed standing up from a chair using your arms (e.g., wheelchair or bedside chair)?: A Little Help needed to walk in hospital room?: A Little Help needed climbing 3-5 steps with a railing? : A Little 6 Click Score: 18    End of Session Equipment Utilized During Treatment: Gait belt Activity Tolerance: Patient tolerated treatment well Patient left: with call bell/phone within reach;in bed;with bed alarm set Nurse Communication: Mobility status PT Visit Diagnosis: Unsteadiness on feet (R26.81);Repeated falls (R29.6);Muscle weakness (generalized)  (M62.81);History of falling (Z91.81);Difficulty in walking, not elsewhere classified (R26.2)     Time: 6256-3893 PT Time Calculation (min) (ACUTE ONLY): 45 min  Charges:                         Hervey Ard, SPT 08/13/20. 5:24 PM

## 2020-08-13 NOTE — Progress Notes (Signed)
PROGRESS NOTE    Douglas Peterson.   RCB:[id number]  DOB: Oct 29, 1951  PCP: [name], MD    DOA: 08/05/2020 LOS: 8   Brief Narrative   Douglas Peterson. is a 69 y.o. male with medical history significant of hypertension, GERD, depression, PAF, alcohol abuse, chronic right-sided weakness presented to the ED due to generalized weakness and diarrhea. Pt continues to drink alcohol heavily on daily basis. Covid was negative. Toxicology was negative. Alcohol level on admission was 268.     Assessment & Plan   Principal Problem:   SIRS (systemic inflammatory response syndrome) (HCC) Active Problems:   Hyponatremia   Alcohol use   Diarrhea   HTN (hypertension)   Malnutrition of moderate degree   Depression   Sepsis (HCC)   Paroxysmal A-fib (HCC)   GERD (gastroesophageal reflux disease)   Right hip pain   Hypophosphatemia   SIRS present on admission  Sepsis ruled out.   Patient met criteria for SIRS with leukocytosis with WBC 20.7, tachycardia with heart rate of 116, tachypnea with RR 22.  Lactic acid 4.8 -->5.6. on admission, likely due to hypovolemia/dehydration. Lactic acid and leukocytosis have improved. UA and chest xray negative. 1 blood culture grew Corynebacterium sp, suspect contaminate.  Has been on Vanc/Cefepime since admission - stopped antibiotics 10/6 and monitor clinically. Repeat blood culture pending - neg to date. Patient remains asymptomatic from infection standpoint.   Right hip pain - X-ray of right hip negative for fracture, but it showed degenerative changes.  PRN pain meds.   PT OT-recommend SNF, placement pending.   Hyponatremia - most likely due to alcohol abuse, poor PO intake, dehydration and beer potomania. Initial Na was 129.   Resolved with IV hydration.  Off IV fluids since 10/6.   Acute metabolic encephalopathy - likely multifactorial, due to EtOH withdrawal, dehydration. Slowly improving.   Continue supportive care.    Uncomplicated  alcohol withdrawal - resolved Alcohol abuse with dependence - CIWA protocol with PRN Ativan.  Tapered off Librium. --thiamine and folic acid supplements --increase thiamine to 100 mg TID WC for 2 weeks, then 100 mg daily   Diarrhea - reported on admission, no further episodes reported.   Stool studies pending but no BM - can cancel.  Hx of CVA with R-sided weakness - stable.  Patient's voice has been soft, poor vocal projection, worse than his usual baseline.  Does report this happens every time he stops drinking, as he recovers strength, it improves.  Has been steadily improving over past several days from barely whisper to now can hear and understand him from across the room. --would consider CT neck soft tissue if worsening    Hypertension - stable, continue atenolol     Malnutrition of moderate degree - continue nutrition supplements   Depression - continue paroxetine, Vistaril PRN   Paroxysmal A-fib - not on chronic anticoagulants at home.  Continue atenolol.   GERD - continue Protonix   Hypokalemia - replaced.  Monitor BMP, Mg and replace as needed.     DVT prophylaxis: enoxaparin (LOVENOX) injection 40 mg Start: 08/05/20 2200   Diet:  Diet Orders (From admission, onward)    Start     Ordered   08/07/20 1206  DIET DYS 3 Room service appropriate? No; Fluid consistency: Thin  Diet effective now       Question Answer Comment  Room service appropriate? No   Fluid consistency: Thin      08/07/20 1205  Code Status: Full Code    Subjective 08/13/20    Patient seen at bedside this AM.  He reports feeling well. Voice getting stronger.  He's been up to bathroom on his own, says he'd prefer to go home, but understands the rehab recommendation for safety.  Still agreeable to rehab during my encounter.  Denies fever/chills, chest pain, SOB or other complaints.    Disposition Plan & Communication   Status is: Inpatient  Remains inpatient appropriate because:  Unsafe d/c plan.  [name] profoundly weak, unsafe to return home alone.  SNF placement pending.   Dispo: The patient is from: Home              Anticipated d/c is to: SNF              Anticipated d/c date is: 1 day              Patient currently IS medically stable for d/c.  Family Communication: none at bedside, will attempt to call.     Consults, Procedures, Significant Events   Consultants:   None  Procedures:   None  Antimicrobials:  Anti-infectives (From admission, onward)   Start     Dose/Rate Route Frequency Ordered Stop   08/05/20 1800  vancomycin (VANCOREADY) IVPB 750 mg/150 mL  Status:  Discontinued        750 mg 150 mL/hr over 60 Minutes Intravenous Every 12 hours 08/05/20 0804 08/08/20 1159   08/05/20 1400  ceFEPIme (MAXIPIME) 2 g in sodium chloride 0.9 % 100 mL IVPB  Status:  Discontinued        2 g 200 mL/hr over 30 Minutes Intravenous Every 8 hours 08/05/20 0804 08/08/20 1159   08/05/20 0445  vancomycin (VANCOCIN) IVPB 1000 mg/200 mL premix        1,000 mg 200 mL/hr over 60 Minutes Intravenous  Once 08/05/20 0430 08/05/20 0728   08/05/20 0430  ceFEPIme (MAXIPIME) 1 g in sodium chloride 0.9 % 100 mL IVPB        1 g 200 mL/hr over 30 Minutes Intravenous  Once 08/05/20 0430 08/05/20 0611        Objective   Vitals:   08/13/20 0444 08/13/20 1009 08/13/20 1126 08/13/20 1628  BP: 133/84 130/85 129/75 108/83  Pulse: 69 72 70 64  Resp: _0 Temp: 98.5 F (36.9 C) 97.9 F (36.6 C) 97.9 F (36.6 C) (!) 97.5 F (36.4 C)  TempSrc: Oral Oral Oral Oral  SpO2: 96% 97% 94% 95%  Weight:      Height:        Intake/Output Summary (Last 24 hours) at 08/13/2020 1632 Last data filed at 08/13/2020 1416 Gross per 24 hour  Intake 840 ml  Output 570 ml  Net 270 ml   Filed Weights   08/05/20 0026 08/06/20 2205  Weight: 54.4 kg 55.7 kg    Physical Exam:  General exam: awake, alert, no acute distress Respiratory system: CTAB, normal respiratory  effort. Cardiovascular system: normal S1/S2, RRR, no pedal edema.   Extremities: no cyanosis, hypertonic right upper extremity stable  Labs   Data Reviewed: I have personally reviewed following labs and imaging studies  CBC: Recent Labs  Lab 08/07/20 0528  WBC 6.1  HGB 13.9  HCT 38.6*  MCV 86.0  PLT 431*   Basic Metabolic Panel: Recent Labs  Lab 08/07/20 0528 08/08/20 0449 08/09/20 0551 08/12/20 0454 08/13/20 0434  NA 136 135 135  --  140  K 3.4* 3.6 3.8  --  3.8  CL 100 97* 97*  --  101  CO2 _0 --  29  GLUCOSE 108* 112* 94  --  106*  BUN 7* 14 16  --  19  CREATININE 0.42* 0.46* 0.49* 0.51* 0.52*  CALCIUM 8.5* 8.8* 9.4  --  9.1  MG  --   --  1.8  --  2.0  PHOS  --   --  2.9  --   --    GFR: Estimated Creatinine Clearance: 68.7 mL/min (A) (by C-G formula based on SCr of 0.52 mg/dL (L)). Liver Function Tests: No results for input(s): AST, ALT, ALKPHOS, BILITOT, PROT, ALBUMIN in the last 168 hours. No results for input(s): LIPASE, AMYLASE in the last 168 hours. No results for input(s): AMMONIA in the last 168 hours. Coagulation Profile: No results for input(s): INR, PROTIME in the last 168 hours. Cardiac Enzymes: No results for input(s): CKTOTAL, CKMB, CKMBINDEX, TROPONINI in the last 168 hours. BNP (last 3 results) No results for input(s): PROBNP in the last 8760 hours. HbA1C: No results for input(s): HGBA1C in the last 72 hours. CBG: No results for input(s): GLUCAP in the last 168 hours. Lipid Profile: No results for input(s): CHOL, HDL, LDLCALC, TRIG, CHOLHDL, LDLDIRECT in the last 72 hours. Thyroid Function Tests: No results for input(s): TSH, T4TOTAL, FREET4, T3FREE, THYROIDAB in the last 72 hours. Anemia Panel: No results for input(s): VITAMINB12, FOLATE, FERRITIN, TIBC, IRON, RETICCTPCT in the last 72 hours. Sepsis Labs: No results for input(s): PROCALCITON, LATICACIDVEN in the last 168 hours.  Recent Results (from the past 240 hour(s))    Respiratory Panel by RT PCR (Flu A&B, Covid) - Nasopharyngeal Swab     Status: None   Collection Time: 08/05/20 12:30 AM   Specimen: Nasopharyngeal Swab  Result Value Ref Range Status   SARS Coronavirus 2 by RT PCR NEGATIVE NEGATIVE Final    Comment: (NOTE) SARS-CoV-2 target nucleic acids are NOT DETECTED.  The SARS-CoV-2 RNA is generally detectable in upper respiratoy specimens during the acute phase of infection. The lowest concentration of SARS-CoV-2 viral copies this assay can detect is 131 copies/mL. A negative result does not preclude SARS-Cov-2 infection and should not be used as the sole basis for treatment or other patient management decisions. A negative result may occur with  improper specimen collection/handling, submission of specimen other than nasopharyngeal swab, presence of viral mutation(s) within the areas targeted by this assay, and inadequate number of viral copies (<131 copies/mL). A negative result must be combined with clinical observations, patient history, and epidemiological information. The expected result is Negative.  Fact Sheet for Patients:  PinkCheek.be  Fact Sheet for Healthcare Providers:  GravelBags.it  This test is no t yet approved or cleared by the [place] FDA and  has been authorized for detection and/or diagnosis of SARS-CoV-2 by FDA under an Emergency Use Authorization (EUA). This EUA will remain  in effect (meaning this test can be used) for the duration of the COVID-19 declaration under Section 564(b)(1) of the Act, 21 U.S.C. section 360bbb-3(b)(1), unless the authorization is terminated or revoked sooner.     Influenza A by PCR NEGATIVE NEGATIVE Final   Influenza B by PCR NEGATIVE NEGATIVE Final    Comment: (NOTE) The Xpert Xpress SARS-CoV-2/FLU/RSV assay is intended as an aid in  the diagnosis of influenza from Nasopharyngeal swab specimens and  should not be used as  a sole basis for treatment. Nasal washings  and  aspirates are unacceptable for Xpert Xpress SARS-CoV-2/FLU/RSV  testing.  Fact Sheet for Patients: PinkCheek.be  Fact Sheet for Healthcare Providers: GravelBags.it  This test is not yet approved or cleared by the [place] FDA and  has been authorized for detection and/or diagnosis of SARS-CoV-2 by  FDA under an Emergency Use Authorization (EUA). This EUA will remain  in effect (meaning this test can be used) for the duration of the  Covid-19 declaration under Section 564(b)(1) of the Act, 21  U.S.C. section 360bbb-3(b)(1), unless the authorization is  terminated or revoked. Performed at [facility], [address], [place], [place] [address]   Urine culture     Status: Abnormal   Collection Time: 08/05/20 12:30 AM   Specimen: In/Out Cath Urine  Result Value Ref Range Status   Specimen Description   Final    IN/OUT CATH URINE Performed at [facility], [address] [place], [place] [address]    Special Requests   Final    NONE Performed at [facility], [address] [place], [place] [address]    Culture MULTIPLE SPECIES PRESENT, SUGGEST RECOLLECTION (A)  Final   Report Status 08/06/2020 FINAL  Final  Blood culture (routine x 2)     Status: Abnormal   Collection Time: 08/05/20  4:39 AM   Specimen: BLOOD  Result Value Ref Range Status   Specimen Description   Final    BLOOD LEFT ANTECUBITAL Performed at [facility], [address] [place], [place] [address]    Special Requests   Final    BOTTLES DRAWN AEROBIC AND ANAEROBIC Blood Culture adequate volume Performed at [facility], [address], [place], [place] [address]    Culture  Setup Time   Final    GRAM POSITIVE RODS IN BOTH AEROBIC AND ANAEROBIC BOTTLES CRITICAL RESULT CALLED TO, READ BACK BY AND VERIFIED WITH: SUSAN [name] 08/06/20 AT 1712 BY  ACR    Culture (A)  Final    DIPHTHEROIDS(CORYNEBACTERIUM SPECIES) Standardized susceptibility testing for this organism is not available. Performed at [facility], [place] [address] [place], [place] [address]    Report Status 08/09/2020 FINAL  Final  Blood culture (routine x 2)     Status: None   Collection Time: 08/05/20  4:39 AM   Specimen: BLOOD  Result Value Ref Range Status   Specimen Description BLOOD BLOOD LEFT HAND  Final   Special Requests   Final    BOTTLES DRAWN AEROBIC AND ANAEROBIC Blood Culture results may not be optimal due to an inadequate volume of blood received in culture bottles   Culture   Final    NO GROWTH 5 DAYS Performed at [facility], [address], [place], [place] [address]    Report Status 08/10/2020 FINAL  Final  Culture, blood (single) w Reflex to ID Panel     Status: None   Collection Time: 08/08/20  4:09 PM   Specimen: BLOOD  Result Value Ref Range Status   Specimen Description BLOOD BLOOD LEFT HAND  Final   Special Requests   Final    BOTTLES DRAWN AEROBIC AND ANAEROBIC Blood Culture adequate volume   Culture   Final    NO GROWTH 5 DAYS Performed at [facility], [address] [place], [place] [address]    Report Status 08/13/2020 FINAL  Final      Imaging Studies   No results found.   Medications   Scheduled Meds:  acidophilus  2 capsule Oral TID   vitamin C  1,000 mg Oral Daily   aspirin EC  81 mg Oral Daily   atenolol  25 mg Oral BID   baclofen  10 mg Oral TID   calcium-vitamin D  1 tablet Oral Q breakfast   cholecalciferol  5,000 Units Oral Daily   enoxaparin (LOVENOX) injection  40 mg Subcutaneous Q24H   feeding supplement (ENSURE ENLIVE)  237 mL Oral BID BM   folic acid  1 mg Oral Daily   multivitamin with minerals  1 tablet Oral Daily   pantoprazole  40 mg Oral BID AC   PARoxetine  30 mg Oral Daily   senna-docusate  2 tablet Oral QHS   thiamine  100 mg Oral TID with meals    Or   thiamine  100 mg Intravenous TID with meals   Continuous Infusions:     LOS: 8 days    Time spent: 15 minutes    Ezekiel Slocumb, DO [facility]  08/13/2020, 4:32 PM    If 7PM-7AM, please contact night-coverage. How to contact the [facility] Attending or Consulting provider [place] or covering provider during after hours [place], for this patient?    1. Check the care team in [facility] and look for a) attending/consulting TR[facility] provider listed and b) the [facility] team listed 2. Log into [email or web address] and use 's universal password to access. If you do not have the password, please contact the hospital operator. 3. Locate the [facility] provider you are looking for under [facility] and page to a number that you can be directly reached. 4. If you still have difficulty reaching the provider, please page the [facility] (Director on Call) for the Hospitalists listed on amion for assistance.

## 2020-08-14 MED ORDER — SENNOSIDES-DOCUSATE SODIUM 8.6-50 MG PO TABS: 2.0000 | ORAL_TABLET | Freq: Every day | ORAL | 1 refills | Status: DC

## 2020-08-14 MED ORDER — THIAMINE HCL 100 MG PO TABS: ORAL_TABLET | ORAL | 0 refills | Status: DC

## 2020-08-14 NOTE — Progress Notes (Signed)
Discharge instructions given to patient FTF. Patient verbalized understanding. No further questions or concerns at this time. Cab being called for patient.   Thresa Ross, RN

## 2020-08-14 NOTE — Discharge Summary (Signed)
Physician Discharge Summary  Douglas Peterson. WUG:[id number] DOB: 01-13-1951 DOA: 08/05/2020  PCP: [name], MD  Admit date: 08/05/2020 Discharge date: 08/14/2020  Admitted From: home Disposition:  home  Recommendations for Outpatient Follow-up:  1. Follow up with PCP in 1-2 weeks 2. Please obtain BMP/CBC in one week  Home Health: PT, OT, SW  Equipment/Devices: none   Discharge Condition: stable  CODE STATUS: full  Diet recommendation: Dysphagia level 3  Discharge Diagnoses: Principal Problem:   SIRS (systemic inflammatory response syndrome) (HCC) Active Problems:   Hyponatremia   Alcohol use   Diarrhea   HTN (hypertension)   Malnutrition of moderate degree   Depression   Sepsis (HCC)   Paroxysmal A-fib (HCC)   GERD (gastroesophageal reflux disease)   Right hip pain   Hypophosphatemia    Summary of HPI and Hospital Course:  Douglas Peterson. is a 69 y.o. male with medical history significant of hypertension, GERD, depression, PAF, alcohol abuse, chronic right-sided weakness presented to the ED due to generalized weakness and diarrhea. Pt continues to drink alcohol heavily on daily basis. Covid was negative. Toxicology was negative. Alcohol level on admission was 268.    SIRS present on admission  Sepsis ruled out.   Patient met criteria for SIRS with leukocytosis with WBC 20.7, tachycardia with heart rate of 116, tachypnea with RR 22.  Lactic acid 4.8 -->5.6.on admission, likely due to hypovolemia/dehydration. Lactic acid and leukocytosis have improved. UA and chest xray negative. 1 blood culture grew Corynebacterium sp, suspected contaminate.  Repeat blood cultures negative. Has been on Vanc/Cefepime since admission - stopped antibiotics 10/6 and monitor clinically. Patient remains asymptomatic from infection standpoint.  Right hip pain - X-ray of right hip negative for fracture, but it showed degenerative changes.  PRN pain meds.   PT OT-recommend SNF,  placement pending.  Patient ultimately declined to go to SNF and will be discharged home with home health services.  Hyponatremia - most likely due toalcohol abuse,poor PO intake, dehydrationandbeer potomania. Initial Na was 129.   Resolved with IV hydration.  Off IV fluids since 10/6.  Acute metabolic encephalopathy - likely multifactorial, due to EtOH withdrawal, dehydration. Resolved with supportive care as outlined.  Uncomplicated alcohol withdrawal - resolved Alcohol abuse with dependence - managed with CIWA protocol with PRN Ativan.  Tapered off Librium. --thiamine and folic acid supplements --Increased thiamine to higher dose for possible component of Wernicke's: Take 100 mg TID WC for 2 weeks, then 100 mg daily  Diarrhea - reported on admission, no further episodes here.  Stool studies cancelled.  Constipation - resolved prior to discharge with bowel regimen.  Hx of CVA with R-sided weakness - stable.   Patient's voice had been soft, poor vocal projection, worse than his usual baseline as this is a residual deficit from prior stroke. Does report this happens every time he stops drinking, as he recovers strength, it improves.  Steadily improved as his overall condition and strength improved.  No further evaluation indicated at this time.  Hypertension - stable, continue atenolol Malnutrition of moderate degree - continue nutrition supplements Depression - continue paroxetine, Vistaril PRN Paroxysmal A-fib - not onchronic anticoagulants at home.  Continue atenolol. GERD - continue Protonix Hypokalemia - replaced.  Monitor BMP, Mg and replace as needed.     Discharge Instructions     Discharge Instructions    Call MD for:  extreme fatigue   Complete by: As directed    Call MD for:  persistant dizziness or  light-headedness   Complete by: As directed    Call MD for:  persistant nausea and vomiting   Complete by: As directed    Call MD for:  severe  uncontrolled pain   Complete by: As directed    Call MD for:  temperature >100.4   Complete by: As directed    Diet - low sodium heart healthy   Complete by: As directed    Discharge instructions   Complete by: As directed    Please AVOID ALCOHOL completely.  If you have difficulty with cravings, please talk to your primary doctor about medications such as naltrexone or acamprosate that can help.  Would recommend seeking support from local community resources to support your sobriety - such as AA meetings.  Take thiamine (vitamin B1) 3 times daily with meals for the next week, then just once daily, and stay on it.   Increase activity slowly   Complete by: As directed      Allergies as of 08/14/2020      Reactions   Hydrochlorothiazide Other (See Comments)   Hyponatremia      Medication List    TAKE these medications   acetaminophen 500 MG tablet Commonly known as: TYLENOL Take 500-1,000 mg by mouth every 6 (six) hours as needed for mild pain or fever.   aspirin EC 81 MG tablet Take 81 mg by mouth daily. Swallow whole.   atenolol 25 MG tablet Commonly known as: TENORMIN Take 25 mg by mouth 2 (two) times daily.   Baclofen 5 MG Tabs Take 10 mg by mouth 3 (three) times daily.   calcium-vitamin D 500-200 MG-UNIT tablet Commonly known as: OSCAL WITH D Take 1 tablet by mouth daily with breakfast.   Cholecalciferol 125 MCG (5000 UT) Tabs Take 5,000 Units by mouth daily.   doxepin 10 MG capsule Commonly known as: SINEQUAN Take 10 mg by mouth at bedtime as needed (sleep).   feeding supplement (ENSURE ENLIVE) Liqd Take 237 mLs by mouth 2 (two) times daily between meals.   folic acid 1 MG tablet Commonly known as: FOLVITE Take 1 tablet (1 mg total) by mouth daily.   hydrOXYzine 25 MG tablet Commonly known as: ATARAX/VISTARIL Take 25 mg by mouth 3 (three) times daily as needed for anxiety or itching.   multivitamin with minerals Tabs tablet Take 1 tablet by mouth  daily.   omeprazole 20 MG tablet Commonly known as: PriLOSEC OTC Take 1 tablet (20 mg total) by mouth daily.   PARoxetine 30 MG tablet Commonly known as: PAXIL Take 30 mg by mouth daily.   senna-docusate 8.6-50 MG tablet Commonly known as: Senokot-S Take 2 tablets by mouth at bedtime. What changed: when to take this   thiamine 100 MG tablet Take 1 tablet (100 mg total) by mouth with breakfast, with lunch, and with evening meal for 7 days, THEN 1 tablet (100 mg total) daily. Start taking on: August 14, 2020 What changed: See the new instructions.   vitamin C 1000 MG tablet Take 1,000 mg by mouth daily.       Allergies  Allergen Reactions  . Hydrochlorothiazide Other (See Comments)    Hyponatremia    Consultations:  none    Procedures/Studies: CT ABDOMEN PELVIS W CONTRAST  Result Date: 08/05/2020 CLINICAL DATA:  Abdominal pain, alcohol intoxication. EXAM: CT ABDOMEN AND PELVIS WITH CONTRAST TECHNIQUE: Multidetector CT imaging of the abdomen and pelvis was performed using the standard protocol following bolus administration of intravenous contrast. CONTRAST:  143m  OMNIPAQUE IOHEXOL 300 MG/ML  SOLN COMPARISON:  03/17/2020 CT abdomen/pelvis. FINDINGS: Lower chest: Mild platelike scarring versus atelectasis at the right lung base. Symmetric mild bilateral gynecomastia is unchanged. Chronic circumferential wall thickening in the lower thoracic esophagus appears unchanged. Hepatobiliary: Diffuse hepatic steatosis. Question fine liver surface irregularity, cannot exclude cirrhosis. No liver masses. Normal gallbladder with no radiopaque cholelithiasis. No biliary ductal dilatation. Pancreas: Normal, with no mass or duct dilation. Spleen: Normal size. No mass. Adrenals/Urinary Tract: Normal adrenals. No hydronephrosis. Subcentimeter hypodense renal cortical lesion in the posterior lower left kidney is stable and considered benign. No suspicious renal masses. Normal bladder.  Stomach/Bowel: Normal non-distended stomach. Normal caliber small bowel with no small bowel wall thickening. Appendectomy. Moderate left inguinal hernia contains a portion of the sigmoid colon, unchanged. No large bowel wall thickening, significant diverticulosis, caliber transition, pneumatosis or acute pericolonic fat stranding. Vascular/Lymphatic: Atherosclerotic nonaneurysmal abdominal aorta. Patent portal, splenic, hepatic and renal veins. No pathologically enlarged lymph nodes in the abdomen or pelvis. Reproductive: Mild prostatomegaly. Other: No pneumoperitoneum, ascites or focal fluid collection. Musculoskeletal: No aggressive appearing focal osseous lesions. Healing anterior right fifth and sixth rib fractures. Chronic moderate L2 vertebral compression fracture status post vertebroplasty. Chronic severe L1 vertebral compression fracture. Chronic mild superior T12 vertebral compression fracture. Chronic mild to moderate T8 vertebral compression fracture. Moderate thoracolumbar spondylosis. IMPRESSION: 1. No acute abnormality. No evidence of bowel obstruction or acute bowel inflammation. Chronic moderate left inguinal hernia contains a portion of the sigmoid colon, unchanged, without acute bowel complication. 2. Diffuse hepatic steatosis. Question fine liver surface irregularity, cannot exclude cirrhosis. No liver masses. 3. Chronic circumferential wall thickening in the lower thoracic esophagus, nonspecific, most commonly due to esophagitis. 4. Healing anterior right fifth and sixth rib fractures. 5. Mild prostatomegaly. 6. Aortic Atherosclerosis (ICD10-I70.0). Electronically Signed   By: Ilona Sorrel M.D.   On: 08/05/2020 05:25   DG Chest Portable 1 View  Result Date: 08/05/2020 CLINICAL DATA:  Cough, alcohol EXAM: PORTABLE CHEST 1 VIEW COMPARISON:  June 08, 2020 FINDINGS: The heart size and mediastinal contours are within normal limits. Both lungs are clear. The visualized skeletal structures are  unremarkable. IMPRESSION: No active disease. Electronically Signed   By: Prudencio Pair M.D.   On: 08/05/2020 01:14   DG HIP UNILAT WITH PELVIS 2-3 VIEWS RIGHT  Result Date: 08/05/2020 CLINICAL DATA:  RIGHT hip pain after fall EXAM: DG HIP (WITH OR WITHOUT PELVIS) 2-3V RIGHT COMPARISON:  July 06, 2020, August 05, 2020 FINDINGS: Osteopenia. No acute fracture or dislocation. Mild degenerative changes of the RIGHT hip with joint space narrowing. Status post hernia repair. No area of erosion or osseous destruction. Excreted contrast within the bladder. Pelvic phleboliths. Vascular calcifications. Included view of the contralateral hip is unremarkable. IMPRESSION: Mild degenerative changes of the RIGHT hip. No acute fracture or dislocation. If persistent concern for nondisplaced hip or pelvic fracture, recommend dedicated pelvic MRI. Electronically Signed   By: Valentino Saxon MD   On: 08/05/2020 09:04       Subjective: Pt seen this AM, reports feeling great.  Had BM finally.  No abdominal pain, N/V, F/C, CP or SOB.  He again confirms he wishes to return home instead of going to rehab, feels that would be a "waste of time" as he feels near baseline and thinks he will be safe at home and able to manage.   Discharge Exam: Vitals:   08/14/20 0451 08/14/20 0744  BP: (!) 98/47 123/82  Pulse: 67 60  Resp: 16   Temp: 97.9 F (36.6 C) 97.7 F (36.5 C)  SpO2: 94% 96%   Vitals:   08/13/20 2022 08/13/20 2324 08/14/20 0451 08/14/20 0744  BP: 120/69 106/61 (!) 98/47 123/82  Pulse: 68 66 67 60  Resp: 16 18 16    Temp: 97.6 F (36.4 C) 98.4 F (36.9 C) 97.9 F (36.6 C) 97.7 F (36.5 C)  TempSrc: Oral  Oral Oral  SpO2: 94% 95% 94% 96%  Weight:      Height:        General: Pt is alert, awake, not in acute distress Cardiovascular: RRR, S1/S2 +, no rubs, no gallops Respiratory: CTA bilaterally, no wheezing, no rhonchi Abdominal: Soft, NT, ND, bowel sounds + Extremities: no edema, no cyanosis,  right-sided hemiparesis    The results of significant diagnostics from this hospitalization (including imaging, microbiology, ancillary and laboratory) are listed below for reference.     Microbiology: Recent Results (from the past 240 hour(s))  Respiratory Panel by RT PCR (Flu A&B, Covid) - Nasopharyngeal Swab     Status: None   Collection Time: 08/05/20 12:30 AM   Specimen: Nasopharyngeal Swab  Result Value Ref Range Status   SARS Coronavirus 2 by RT PCR NEGATIVE NEGATIVE Final    Comment: (NOTE) SARS-CoV-2 target nucleic acids are NOT DETECTED.  The SARS-CoV-2 RNA is generally detectable in upper respiratoy specimens during the acute phase of infection. The lowest concentration of SARS-CoV-2 viral copies this assay can detect is 131 copies/mL. A negative result does not preclude SARS-Cov-2 infection and should not be used as the sole basis for treatment or other patient management decisions. A negative result may occur with  improper specimen collection/handling, submission of specimen other than nasopharyngeal swab, presence of viral mutation(s) within the areas targeted by this assay, and inadequate number of viral copies (<131 copies/mL). A negative result must be combined with clinical observations, patient history, and epidemiological information. The expected result is Negative.  Fact Sheet for Patients:  PinkCheek.be  Fact Sheet for Healthcare Providers:  GravelBags.it  This test is no t yet approved or cleared by the [place] FDA and  has been authorized for detection and/or diagnosis of SARS-CoV-2 by FDA under an Emergency Use Authorization (EUA). This EUA will remain  in effect (meaning this test can be used) for the duration of the COVID-19 declaration under Section 564(b)(1) of the Act, 21 U.S.C. section 360bbb-3(b)(1), unless the authorization is terminated or revoked sooner.     Influenza A by  PCR NEGATIVE NEGATIVE Final   Influenza B by PCR NEGATIVE NEGATIVE Final    Comment: (NOTE) The Xpert Xpress SARS-CoV-2/FLU/RSV assay is intended as an aid in  the diagnosis of influenza from Nasopharyngeal swab specimens and  should not be used as a sole basis for treatment. Nasal washings and  aspirates are unacceptable for Xpert Xpress SARS-CoV-2/FLU/RSV  testing.  Fact Sheet for Patients: PinkCheek.be  Fact Sheet for Healthcare Providers: GravelBags.it  This test is not yet approved or cleared by the [place] FDA and  has been authorized for detection and/or diagnosis of SARS-CoV-2 by  FDA under an Emergency Use Authorization (EUA). This EUA will remain  in effect (meaning this test can be used) for the duration of the  Covid-19 declaration under Section 564(b)(1) of the Act, 21  U.S.C. section 360bbb-3(b)(1), unless the authorization is  terminated or revoked. Performed at [facility], [address] [place], [place] [address]   Urine culture  Status: Abnormal   Collection Time: 08/05/20 12:30 AM   Specimen: In/Out Cath Urine  Result Value Ref Range Status   Specimen Description   Final    IN/OUT CATH URINE Performed at [facility], [address] [place], [place] [address]    Special Requests   Final    NONE Performed at [facility], [address] [place], [place] [address]    Culture MULTIPLE SPECIES PRESENT, SUGGEST RECOLLECTION (A)  Final   Report Status 08/06/2020 FINAL  Final  Blood culture (routine x 2)     Status: Abnormal   Collection Time: 08/05/20  4:39 AM   Specimen: BLOOD  Result Value Ref Range Status   Specimen Description   Final    BLOOD LEFT ANTECUBITAL Performed at [facility], [address] [place], [place] [address]    Special Requests   Final    BOTTLES DRAWN AEROBIC AND ANAEROBIC Blood Culture adequate volume Performed at  [facility], [address], [place], [place] [address]    Culture  Setup Time   Final    GRAM POSITIVE RODS IN BOTH AEROBIC AND ANAEROBIC BOTTLES CRITICAL RESULT CALLED TO, READ BACK BY AND VERIFIED WITH: SUSAN [name] 08/06/20 AT 1712 BY ACR    Culture (A)  Final    DIPHTHEROIDS(CORYNEBACTERIUM SPECIES) Standardized susceptibility testing for this organism is not available. Performed at [facility], [place] [address] [place], [place] [address]    Report Status 08/09/2020 FINAL  Final  Blood culture (routine x 2)     Status: None   Collection Time: 08/05/20  4:39 AM   Specimen: BLOOD  Result Value Ref Range Status   Specimen Description BLOOD BLOOD LEFT HAND  Final   Special Requests   Final    BOTTLES DRAWN AEROBIC AND ANAEROBIC Blood Culture results may not be optimal due to an inadequate volume of blood received in culture bottles   Culture   Final    NO GROWTH 5 DAYS Performed at [facility], [address], [place], [place] [address]    Report Status 08/10/2020 FINAL  Final  Culture, blood (single) w Reflex to ID Panel     Status: None   Collection Time: 08/08/20  4:09 PM   Specimen: BLOOD  Result Value Ref Range Status   Specimen Description BLOOD BLOOD LEFT HAND  Final   Special Requests   Final    BOTTLES DRAWN AEROBIC AND ANAEROBIC Blood Culture adequate volume   Culture   Final    NO GROWTH 5 DAYS Performed at [facility], [address] [place], [place] [address]    Report Status 08/13/2020 FINAL  Final     Labs: BNP (last 3 results) Recent Labs    01/18/20 1056 06/07/20 0626  BNP 185.0* 585.2*   Basic Metabolic Panel: Recent Labs  Lab 08/08/20 0449 08/09/20 0551 08/12/20 0454 08/13/20 0434  NA 135 135  --  140  K 3.6 3.8  --  3.8  CL 97* 97*  --  101  CO2 29 29  --  29  GLUCOSE 112* 94  --  106*  BUN 14 16  --  19  CREATININE 0.46* 0.49* 0.51* 0.52*  CALCIUM 8.8* 9.4  --  9.1  MG  --  1.8  --  2.0  PHOS   --  2.9  --   --    Liver Function Tests: No results for input(s): AST, ALT, ALKPHOS, BILITOT,  PROT, ALBUMIN in the last 168 hours. No results for input(s): LIPASE, AMYLASE in the last 168 hours. No results for input(s): AMMONIA in the last 168 hours. CBC: No results for input(s): WBC, NEUTROABS, HGB, HCT, MCV, PLT in the last 168 hours. Cardiac Enzymes: No results for input(s): CKTOTAL, CKMB, CKMBINDEX, TROPONINI in the last 168 hours. BNP: Invalid input(s): POCBNP CBG: No results for input(s): GLUCAP in the last 168 hours. D-Dimer No results for input(s): DDIMER in the last 72 hours. Hgb A1c No results for input(s): HGBA1C in the last 72 hours. Lipid Profile No results for input(s): CHOL, HDL, LDLCALC, TRIG, CHOLHDL, LDLDIRECT in the last 72 hours. Thyroid function studies No results for input(s): TSH, T4TOTAL, T3FREE, THYROIDAB in the last 72 hours.  Invalid input(s): FREET3 Anemia work up No results for input(s): VITAMINB12, FOLATE, FERRITIN, TIBC, IRON, RETICCTPCT in the last 72 hours. Urinalysis    Component Value Date/Time   COLORURINE COLORLESS (A) 08/05/2020 0030   APPEARANCEUR CLEAR (A) 08/05/2020 0030   LABSPEC 1.001 (L) 08/05/2020 0030   PHURINE 6.0 08/05/2020 0030   GLUCOSEU NEGATIVE 08/05/2020 0030   HGBUR SMALL (A) 08/05/2020 0030   BILIRUBINUR NEGATIVE 08/05/2020 0030   KETONESUR NEGATIVE 08/05/2020 0030   PROTEINUR NEGATIVE 08/05/2020 0030   NITRITE NEGATIVE 08/05/2020 0030   LEUKOCYTESUR NEGATIVE 08/05/2020 0030   Sepsis Labs Invalid input(s): PROCALCITONIN,  WBC,  LACTICIDVEN Microbiology Recent Results (from the past 240 hour(s))  Respiratory Panel by RT PCR (Flu A&B, Covid) - Nasopharyngeal Swab     Status: None   Collection Time: 08/05/20 12:30 AM   Specimen: Nasopharyngeal Swab  Result Value Ref Range Status   SARS Coronavirus 2 by RT PCR NEGATIVE NEGATIVE Final    Comment: (NOTE) SARS-CoV-2 target nucleic acids are NOT DETECTED.  The  SARS-CoV-2 RNA is generally detectable in upper respiratoy specimens during the acute phase of infection. The lowest concentration of SARS-CoV-2 viral copies this assay can detect is 131 copies/mL. A negative result does not preclude SARS-Cov-2 infection and should not be used as the sole basis for treatment or other patient management decisions. A negative result may occur with  improper specimen collection/handling, submission of specimen other than nasopharyngeal swab, presence of viral mutation(s) within the areas targeted by this assay, and inadequate number of viral copies (<131 copies/mL). A negative result must be combined with clinical observations, patient history, and epidemiological information. The expected result is Negative.  Fact Sheet for Patients:  PinkCheek.be  Fact Sheet for Healthcare Providers:  GravelBags.it  This test is no t yet approved or cleared by the [place] FDA and  has been authorized for detection and/or diagnosis of SARS-CoV-2 by FDA under an Emergency Use Authorization (EUA). This EUA will remain  in effect (meaning this test can be used) for the duration of the COVID-19 declaration under Section 564(b)(1) of the Act, 21 U.S.C. section 360bbb-3(b)(1), unless the authorization is terminated or revoked sooner.     Influenza A by PCR NEGATIVE NEGATIVE Final   Influenza B by PCR NEGATIVE NEGATIVE Final    Comment: (NOTE) The Xpert Xpress SARS-CoV-2/FLU/RSV assay is intended as an aid in  the diagnosis of influenza from Nasopharyngeal swab specimens and  should not be used as a sole basis for treatment. Nasal washings and  aspirates are unacceptable for Xpert Xpress SARS-CoV-2/FLU/RSV  testing.  Fact Sheet for Patients: PinkCheek.be  Fact Sheet for Healthcare Providers: GravelBags.it  This test is not yet approved or cleared  by the  [place] Architectural [employer] and  has been authorized for detection and/or diagnosis of SARS-CoV-2 by  FDA under an Print production planner (EUA). This EUA will remain  in effect (meaning this test can be used) for the duration of the  Covid-19 declaration under Section 564(b)(1) of the Act, 21  U.S.C. section 360bbb-3(b)(1), unless the authorization is  terminated or revoked. Performed at [facility], [address], [place], [place] [address]   Urine culture     Status: Abnormal   Collection Time: 08/05/20 12:30 AM   Specimen: In/Out Cath Urine  Result Value Ref Range Status   Specimen Description   Final    IN/OUT CATH URINE Performed at [facility], [address], [place], [place] [address]    Special Requests   Final    NONE Performed at [facility], [address] [place], [place] [address]    Culture MULTIPLE SPECIES PRESENT, SUGGEST RECOLLECTION (A)  Final   Report Status 08/06/2020 FINAL  Final  Blood culture (routine x 2)     Status: Abnormal   Collection Time: 08/05/20  4:39 AM   Specimen: BLOOD  Result Value Ref Range Status   Specimen Description   Final    BLOOD LEFT ANTECUBITAL Performed at [facility], [address], [place], [place] [address]    Special Requests   Final    BOTTLES DRAWN AEROBIC AND ANAEROBIC Blood Culture adequate volume Performed at [facility], [address], [place], [place] [address]    Culture  Setup Time   Final    GRAM POSITIVE RODS IN BOTH AEROBIC AND ANAEROBIC BOTTLES CRITICAL RESULT CALLED TO, READ BACK BY AND VERIFIED WITH: SUSAN [name] 08/06/20 AT 1712 BY ACR    Culture (A)  Final    DIPHTHEROIDS(CORYNEBACTERIUM SPECIES) Standardized susceptibility testing for this organism is not available. Performed at [facility], [place] [address] [place], [place] [address]    Report Status 08/09/2020 FINAL  Final  Blood culture (routine x 2)     Status: None   Collection  Time: 08/05/20  4:39 AM   Specimen: BLOOD  Result Value Ref Range Status   Specimen Description BLOOD BLOOD LEFT HAND  Final   Special Requests   Final    BOTTLES DRAWN AEROBIC AND ANAEROBIC Blood Culture results may not be optimal due to an inadequate volume of blood received in culture bottles   Culture   Final    NO GROWTH 5 DAYS Performed at [facility], [address] [place], [place] [address]    Report Status 08/10/2020 FINAL  Final  Culture, blood (single) w Reflex to ID Panel     Status: None   Collection Time: 08/08/20  4:09 PM   Specimen: BLOOD  Result Value Ref Range Status   Specimen Description BLOOD BLOOD LEFT HAND  Final   Special Requests   Final    BOTTLES DRAWN AEROBIC AND ANAEROBIC Blood Culture adequate volume   Culture   Final    NO GROWTH 5 DAYS Performed at [facility], [address], [place], [place] [address]    Report Status 08/13/2020 FINAL  Final     Time coordinating discharge: Over 30 minutes  SIGNED:   Ezekiel Slocumb, DO [facility] 08/14/2020, 9:31 AM   If 7PM-7AM, please contact night-coverage [email or web address]

## 2020-08-14 NOTE — TOC Transition Note (Signed)
Transition of Care [facility]) - CM/SW Discharge Note   Patient Details  Name: Douglas Peterson. MRN: [id number] Date of Birth: Apr 29, 1951  Transition of Care [facility]) CM/SW Contact:  Candie Chroman, LCSW Phone Number: 08/14/2020, 9:54 AM   Clinical Narrative: Patient has orders to discharge home today. [facility] is able to accept him for home health PT, OT, and SW. [name] aware and agreeable. Patient will go home by cab today which he says he can pay for once he returns home and gets his wallet out of his house. Patient called his sister yesterday to notify her of plan for discharge. DSS social worker [name] is aware of plan for discharge today. No further concerns. CSW signing off.     Final next level of care: [place] Barriers to Discharge: Barriers Resolved   Patient Goals and CMS Choice Patient states their goals for this hospitalization and ongoing recovery are:: Patient stated that he wants to go home. CMS Medicare.gov Compare Post Acute Care list provided to:: Patient Choice offered to / list presented to : Patient  Discharge Placement                Patient to be transferred to facility by: Cab. Patient stated he can pay for it once he gets home and gets his wallet out of his house. Name of family member notified: Patient called his sister [name] yesterday to make her aware. DSS social worker [name] is aware. Patient and family notified of of transfer: 08/14/20  Discharge Plan and Services     Post Acute Care Choice: [place]                    HH Arranged: PT, OT, Social Work [facility] Agency: [facility] Date [facility] Agency Contacted: 08/14/20   Representative spoke with at [place]: [place] Interventions     Readmission Risk Interventions Readmission Risk Prevention Plan 08/14/2020 05/19/2020 12/13/2019  Transportation Screening Complete Complete Complete  PCP  or Specialist Appt within 3-5 Days - - (No Data)  [place] or [place] - - (No Data)  Palliative Care Screening - - Complete  Medication Review (RN Care Manager) - Complete Complete  PCP or Specialist appointment within 3-5 days of discharge Complete Complete -  [place] or Home Care Consult Complete Complete -  SW Recovery Care/Counseling Consult Complete Complete -  Palliative Care Screening Not Applicable Not Applicable -  [place] Patient Refused Not Applicable -  Some recent data might be hidden

## 2020-08-14 NOTE — Progress Notes (Signed)
Nutrition Follow Up Note   DOCUMENTATION CODES:   Severe malnutrition in context of social or environmental circumstances  INTERVENTION:   Ensure Enlive po BID, each supplement provides 350 kcal and 20 grams of protein  Magic cup TID with meals, each supplement provides 290 kcal and 9 grams of protein  MVI, thiamine and folic acid in setting of etoh abuse  Dysphagia 3 diet   NUTRITION DIAGNOSIS:   Severe Malnutrition related to social / environmental circumstances (etoh abuse) as evidenced by severe fat depletion, severe muscle depletion.  GOAL:   Patient will meet greater than or equal to 90% of their needs -progressing   MONITOR:   PO intake, Supplement acceptance, Labs, Weight trends, Skin, I & O's  ASSESSMENT:   69 y.o. male with medical history significant of hypertension, GERD, depression, PAF, alcohol abuse and chronic right-sided weakness who is admitted with generalized weakness and diarrhea.   Pt with good appetite and oral intake in hospital; pt eating 100% of meals and drinking Ensure supplements. Refeed labs stable. No new weight since 10/4. Pt to discharge today.  Medications reviewed and include: risaquad, vitamin C, aspirin, oscal w/ D, D3, lovenox, folic acid, MVI, protonix, paxil, senokot, thiamine  Labs reviewed: K 3.8 wnl, creat 0.52(L), Mg 2.0 wnl P 2.9 wnl- 10/7  Diet Order:   Diet Order            Diet - low sodium heart healthy           DIET DYS 3 Room service appropriate? No; Fluid consistency: Thin  Diet effective now                EDUCATION NEEDS:   No education needs have been identified at this time  Skin:  Skin Assessment: Reviewed RN Assessment  Last BM:  10/10- type 4  Height:   Ht Readings from Last 1 Encounters:  08/06/20 5' 4"  (1.626 m)    Weight:   Wt Readings from Last 1 Encounters:  08/06/20 55.7 kg    Ideal Body Weight:  59 kg  BMI:  Body mass index is 21.08 kg/m.  Estimated Nutritional Needs:    Kcal:  1600-1800kcal/day  Protein:  80-90g/day  Fluid:  1.4-1.7L/day  Koleen Distance MS, RD, LDN Please refer to [facility] for RD and/or RD on-call/weekend/after hours pager

## 2020-08-18 ENCOUNTER — Other Ambulatory Visit: Payer: Self-pay

## 2020-08-18 ENCOUNTER — Emergency Department: Payer: Medicare HMO

## 2020-08-18 ENCOUNTER — Encounter: Payer: Self-pay | Admitting: Radiology

## 2020-08-18 ENCOUNTER — Inpatient Hospital Stay
Admission: EM | Admit: 2020-08-18 | Discharge: 2020-08-23 | DRG: 897 | Disposition: A | Discharge status: Home Health | Payer: Medicare HMO | Attending: Internal Medicine | Admitting: Internal Medicine

## 2020-08-18 DIAGNOSIS — L03818 Cellulitis of other sites: Secondary | ICD-10-CM | POA: Diagnosis not present

## 2020-08-18 DIAGNOSIS — Y92015 Private garage of single-family (private) house as the place of occurrence of the external cause: Secondary | ICD-10-CM

## 2020-08-18 DIAGNOSIS — Y92009 Unspecified place in unspecified non-institutional (private) residence as the place of occurrence of the external cause: Secondary | ICD-10-CM | POA: Diagnosis present

## 2020-08-18 DIAGNOSIS — I480 Paroxysmal atrial fibrillation: Secondary | ICD-10-CM | POA: Diagnosis present

## 2020-08-18 DIAGNOSIS — R296 Repeated falls: Secondary | ICD-10-CM | POA: Diagnosis present

## 2020-08-18 DIAGNOSIS — Z79899 Other long term (current) drug therapy: Secondary | ICD-10-CM | POA: Diagnosis not present

## 2020-08-18 DIAGNOSIS — Z888 Allergy status to other drugs, medicaments and biological substances status: Secondary | ICD-10-CM

## 2020-08-18 DIAGNOSIS — S0990XA Unspecified injury of head, initial encounter: Secondary | ICD-10-CM | POA: Diagnosis present

## 2020-08-18 DIAGNOSIS — F10229 Alcohol dependence with intoxication, unspecified: Principal | ICD-10-CM | POA: Diagnosis present

## 2020-08-18 DIAGNOSIS — F1020 Alcohol dependence, uncomplicated: Secondary | ICD-10-CM | POA: Diagnosis not present

## 2020-08-18 DIAGNOSIS — Z659 Problem related to unspecified psychosocial circumstances: Secondary | ICD-10-CM | POA: Diagnosis not present

## 2020-08-18 DIAGNOSIS — W19XXXA Unspecified fall, initial encounter: Secondary | ICD-10-CM | POA: Diagnosis present

## 2020-08-18 DIAGNOSIS — Z87891 Personal history of nicotine dependence: Secondary | ICD-10-CM

## 2020-08-18 DIAGNOSIS — Y906 Blood alcohol level of 120-199 mg/100 ml: Secondary | ICD-10-CM | POA: Diagnosis present

## 2020-08-18 DIAGNOSIS — Z87820 Personal history of traumatic brain injury: Secondary | ICD-10-CM

## 2020-08-18 DIAGNOSIS — R21 Rash and other nonspecific skin eruption: Secondary | ICD-10-CM | POA: Diagnosis present

## 2020-08-18 DIAGNOSIS — Z8249 Family history of ischemic heart disease and other diseases of the circulatory system: Secondary | ICD-10-CM | POA: Diagnosis not present

## 2020-08-18 DIAGNOSIS — Z609 Problem related to social environment, unspecified: Secondary | ICD-10-CM

## 2020-08-18 DIAGNOSIS — Z7982 Long term (current) use of aspirin: Secondary | ICD-10-CM

## 2020-08-18 DIAGNOSIS — R6510 Systemic inflammatory response syndrome (SIRS) of non-infectious origin without acute organ dysfunction: Secondary | ICD-10-CM | POA: Diagnosis present

## 2020-08-18 DIAGNOSIS — F1010 Alcohol abuse, uncomplicated: Secondary | ICD-10-CM | POA: Diagnosis present

## 2020-08-18 DIAGNOSIS — Z20822 Contact with and (suspected) exposure to covid-19: Secondary | ICD-10-CM | POA: Diagnosis present

## 2020-08-18 DIAGNOSIS — F10231 Alcohol dependence with withdrawal delirium: Secondary | ICD-10-CM | POA: Diagnosis present

## 2020-08-18 DIAGNOSIS — E872 Acidosis, unspecified: Secondary | ICD-10-CM

## 2020-08-18 DIAGNOSIS — R52 Pain, unspecified: Secondary | ICD-10-CM

## 2020-08-18 DIAGNOSIS — B379 Candidiasis, unspecified: Secondary | ICD-10-CM | POA: Diagnosis present

## 2020-08-18 DIAGNOSIS — K219 Gastro-esophageal reflux disease without esophagitis: Secondary | ICD-10-CM | POA: Diagnosis present

## 2020-08-18 DIAGNOSIS — F10929 Alcohol use, unspecified with intoxication, unspecified: Secondary | ICD-10-CM | POA: Diagnosis not present

## 2020-08-18 DIAGNOSIS — I10 Essential (primary) hypertension: Secondary | ICD-10-CM | POA: Diagnosis present

## 2020-08-18 DIAGNOSIS — Z9049 Acquired absence of other specified parts of digestive tract: Secondary | ICD-10-CM

## 2020-08-18 DIAGNOSIS — L0390 Cellulitis, unspecified: Secondary | ICD-10-CM

## 2020-08-18 DIAGNOSIS — G8191 Hemiplegia, unspecified affecting right dominant side: Secondary | ICD-10-CM | POA: Diagnosis present

## 2020-08-18 DIAGNOSIS — F10239 Alcohol dependence with withdrawal, unspecified: Secondary | ICD-10-CM | POA: Diagnosis present

## 2020-08-18 LAB — LACTIC ACID, PLASMA
Lactic Acid, Venous: 4.9 mmol/L (ref 0.5–1.9)
Lactic Acid, Venous: 5.5 mmol/L (ref 0.5–1.9)

## 2020-08-18 LAB — COMPREHENSIVE METABOLIC PANEL
ALT: 29 U/L (ref 0–44)
AST: 32 U/L (ref 15–41)
Albumin: 4.5 g/dL (ref 3.5–5.0)
Alkaline Phosphatase: 104 U/L (ref 38–126)
Anion gap: 15 (ref 5–15)
BUN: <5 mg/dL — ABNORMAL LOW (ref 8–23)
CO2: 23 mmol/L (ref 22–32)
Calcium: 8.8 mg/dL — ABNORMAL LOW (ref 8.9–10.3)
Chloride: 97 mmol/L — ABNORMAL LOW (ref 98–111)
Creatinine, Ser: 0.54 mg/dL — ABNORMAL LOW (ref 0.61–1.24)
GFR, Estimated: >60 mL/min (ref >60)
Glucose, Bld: 96 mg/dL (ref 70–99)
Potassium: 4.5 mmol/L (ref 3.5–5.1)
Sodium: 135 mmol/L (ref 135–145)
Total Bilirubin: 0.8 mg/dL (ref 0.3–1.2)
Total Protein: 8.2 g/dL — ABNORMAL HIGH (ref 6.5–8.1)

## 2020-08-18 LAB — URINALYSIS, COMPLETE (UACMP) WITH MICROSCOPIC
Bacteria, UA: NONE SEEN
Bilirubin Urine: NEGATIVE
Glucose, UA: NEGATIVE mg/dL
Hgb urine dipstick: NEGATIVE
Ketones, ur: NEGATIVE mg/dL
Leukocytes,Ua: NEGATIVE
Nitrite: NEGATIVE
Protein, ur: NEGATIVE mg/dL
Specific Gravity, Urine: 1.005 (ref 1.005–1.030)
pH: 5 (ref 5.0–8.0)

## 2020-08-18 LAB — CBC WITH DIFFERENTIAL/PLATELET
Abs Immature Granulocytes: 0.05 10*3/uL (ref 0.00–0.07)
Basophils Absolute: 0.1 10*3/uL (ref 0.0–0.1)
Basophils Relative: 1 %
Eosinophils Absolute: 0.1 10*3/uL (ref 0.0–0.5)
Eosinophils Relative: 1 %
HCT: 46.9 % (ref 39.0–52.0)
Hemoglobin: 16 g/dL (ref 13.0–17.0)
Immature Granulocytes: 0 %
Lymphocytes Relative: 45 %
Lymphs Abs: 7.8 10*3/uL — ABNORMAL HIGH (ref 0.7–4.0)
MCH: 29.8 pg (ref 26.0–34.0)
MCHC: 34.1 g/dL (ref 30.0–36.0)
MCV: 87.3 fL (ref 80.0–100.0)
Monocytes Absolute: 1.2 10*3/uL — ABNORMAL HIGH (ref 0.1–1.0)
Monocytes Relative: 7 %
Neutro Abs: 7.9 10*3/uL — ABNORMAL HIGH (ref 1.7–7.7)
Neutrophils Relative %: 46 %
Platelets: 370 10*3/uL (ref 150–400)
RBC: 5.37 MIL/uL (ref 4.22–5.81)
RDW: 13.6 % (ref 11.5–15.5)
Smear Review: NORMAL
WBC: 17.1 10*3/uL — ABNORMAL HIGH (ref 4.0–10.5)
nRBC: 0 % (ref 0.0–0.2)

## 2020-08-18 LAB — CK: Total CK: 157 U/L (ref 49–397)

## 2020-08-18 LAB — ETHANOL: Alcohol, Ethyl (B): 157 mg/dL — ABNORMAL HIGH (ref <10)

## 2020-08-18 LAB — TROPONIN I (HIGH SENSITIVITY)
Troponin I (High Sensitivity): 5 ng/L (ref <18)
Troponin I (High Sensitivity): 5 ng/L (ref <18)

## 2020-08-18 LAB — MAGNESIUM: Magnesium: 1.9 mg/dL (ref 1.7–2.4)

## 2020-08-18 LAB — RESPIRATORY PANEL BY RT PCR (FLU A&B, COVID)
Influenza A by PCR: NEGATIVE
Influenza B by PCR: NEGATIVE
SARS Coronavirus 2 by RT PCR: NEGATIVE

## 2020-08-18 LAB — PHOSPHORUS: Phosphorus: 2.7 mg/dL (ref 2.5–4.6)

## 2020-08-18 MED ORDER — LORAZEPAM 2 MG/ML IJ SOLN
0.0000 mg | Freq: Four times a day (QID) | INTRAMUSCULAR | Status: AC
  Administered 2020-08-18 – 2020-08-20 (×3): 2 mg via INTRAVENOUS
  Filled 2020-08-18 (×3): qty 1

## 2020-08-18 MED ORDER — THIAMINE HCL 100 MG PO TABS: 100.0000 mg | ORAL_TABLET | Freq: Every day | ORAL | Status: DC

## 2020-08-18 MED ORDER — ONDANSETRON HCL 4 MG/2ML IJ SOLN: 4.0000 mg | Freq: Four times a day (QID) | INTRAMUSCULAR | Status: DC | PRN

## 2020-08-18 MED ORDER — ACETAMINOPHEN 650 MG RE SUPP: 650.0000 mg | Freq: Four times a day (QID) | RECTAL | Status: DC | PRN

## 2020-08-18 MED ORDER — ENOXAPARIN SODIUM 40 MG/0.4ML ~~LOC~~ SOLN
40.0000 mg | SUBCUTANEOUS | Status: DC
  Administered 2020-08-18 – 2020-08-22 (×5): 40 mg via SUBCUTANEOUS
  Filled 2020-08-18 (×5): qty 0.4

## 2020-08-18 MED ORDER — ACETAMINOPHEN 325 MG PO TABS
650.0000 mg | ORAL_TABLET | Freq: Four times a day (QID) | ORAL | Status: DC | PRN
  Administered 2020-08-20 – 2020-08-22 (×3): 650 mg via ORAL
  Filled 2020-08-18 (×3): qty 2

## 2020-08-18 MED ORDER — FOLIC ACID 1 MG PO TABS
1.0000 mg | ORAL_TABLET | Freq: Every day | ORAL | Status: DC
  Administered 2020-08-19 – 2020-08-23 (×5): 1 mg via ORAL
  Filled 2020-08-18 (×5): qty 1

## 2020-08-18 MED ORDER — LORAZEPAM 2 MG/ML IJ SOLN
0.0000 mg | Freq: Two times a day (BID) | INTRAMUSCULAR | Status: AC
  Administered 2020-08-21: 1 mg via INTRAVENOUS
  Filled 2020-08-18 (×2): qty 1

## 2020-08-18 MED ORDER — THIAMINE HCL 100 MG PO TABS
100.0000 mg | ORAL_TABLET | Freq: Every day | ORAL | Status: DC
  Administered 2020-08-21 – 2020-08-23 (×2): 100 mg via ORAL
  Filled 2020-08-18 (×5): qty 1

## 2020-08-18 MED ORDER — FOLIC ACID 1 MG PO TABS: 1.0000 mg | ORAL_TABLET | Freq: Every day | ORAL | Status: DC

## 2020-08-18 MED ORDER — SODIUM CHLORIDE 0.9 % IV SOLN
2.0000 g | Freq: Once | INTRAVENOUS | Status: AC
  Administered 2020-08-18: 2 g via INTRAVENOUS
  Filled 2020-08-18: qty 2

## 2020-08-18 MED ORDER — LORAZEPAM 1 MG PO TABS: 1.0000 mg | ORAL_TABLET | ORAL | Status: AC | PRN

## 2020-08-18 MED ORDER — THIAMINE HCL 100 MG/ML IJ SOLN: 100.0000 mg | Freq: Every day | INTRAMUSCULAR | Status: DC

## 2020-08-18 MED ORDER — LORAZEPAM 2 MG PO TABS
0.0000 mg | ORAL_TABLET | Freq: Two times a day (BID) | ORAL | Status: AC
  Administered 2020-08-22: 2 mg via ORAL
  Filled 2020-08-18: qty 1

## 2020-08-18 MED ORDER — SODIUM CHLORIDE 0.9 % IV BOLUS: 500.0000 mL | Freq: Once | INTRAVENOUS | Status: DC

## 2020-08-18 MED ORDER — VANCOMYCIN HCL 750 MG/150ML IV SOLN
750.0000 mg | Freq: Two times a day (BID) | INTRAVENOUS | Status: DC
  Administered 2020-08-19 – 2020-08-20 (×3): 750 mg via INTRAVENOUS
  Filled 2020-08-18 (×5): qty 150

## 2020-08-18 MED ORDER — THIAMINE HCL 100 MG/ML IJ SOLN
100.0000 mg | Freq: Every day | INTRAMUSCULAR | Status: DC
  Administered 2020-08-19 – 2020-08-22 (×3): 100 mg via INTRAVENOUS
  Filled 2020-08-18 (×4): qty 2

## 2020-08-18 MED ORDER — ADULT MULTIVITAMIN W/MINERALS CH
1.0000 | ORAL_TABLET | Freq: Every day | ORAL | Status: DC
  Administered 2020-08-19 – 2020-08-23 (×5): 1 via ORAL
  Filled 2020-08-18 (×5): qty 1

## 2020-08-18 MED ORDER — SODIUM CHLORIDE 0.9 % IV BOLUS
1000.0000 mL | Freq: Once | INTRAVENOUS | Status: AC
  Administered 2020-08-18: 1000 mL via INTRAVENOUS

## 2020-08-18 MED ORDER — ONDANSETRON HCL 4 MG PO TABS: 4.0000 mg | ORAL_TABLET | Freq: Four times a day (QID) | ORAL | Status: DC | PRN

## 2020-08-18 MED ORDER — LORAZEPAM 2 MG PO TABS
0.0000 mg | ORAL_TABLET | Freq: Four times a day (QID) | ORAL | Status: AC
  Administered 2020-08-19 (×2): 2 mg via ORAL
  Filled 2020-08-18 (×3): qty 1

## 2020-08-18 MED ORDER — LORAZEPAM 2 MG/ML IJ SOLN
1.0000 mg | INTRAMUSCULAR | Status: AC | PRN
  Administered 2020-08-19 – 2020-08-20 (×2): 2 mg via INTRAVENOUS
  Administered 2020-08-21 (×3): 1 mg via INTRAVENOUS
  Filled 2020-08-18 (×4): qty 1

## 2020-08-18 MED ORDER — SODIUM CHLORIDE 0.9 % IV BOLUS (SEPSIS)
250.0000 mL | Freq: Once | INTRAVENOUS | Status: AC
  Administered 2020-08-18: 250 mL via INTRAVENOUS

## 2020-08-18 MED ORDER — SODIUM CHLORIDE 0.9 % IV SOLN
2.0000 g | INTRAVENOUS | Status: DC
  Administered 2020-08-19 – 2020-08-20 (×2): 2 g via INTRAVENOUS
  Filled 2020-08-18 (×2): qty 20

## 2020-08-18 MED ORDER — VANCOMYCIN HCL IN DEXTROSE 1-5 GM/200ML-% IV SOLN
1000.0000 mg | Freq: Once | INTRAVENOUS | Status: AC
  Administered 2020-08-18: 1000 mg via INTRAVENOUS
  Filled 2020-08-18: qty 200

## 2020-08-18 MED ORDER — ADULT MULTIVITAMIN W/MINERALS CH: 1.0000 | ORAL_TABLET | Freq: Every day | ORAL | Status: DC

## 2020-08-18 MED ORDER — LORAZEPAM 2 MG/ML IJ SOLN: 0.0000 mg | Freq: Two times a day (BID) | INTRAMUSCULAR | Status: DC

## 2020-08-18 MED ORDER — SODIUM CHLORIDE 0.9 % IV SOLN: INTRAVENOUS | Status: DC

## 2020-08-18 MED ORDER — SODIUM CHLORIDE 0.9 % IV BOLUS (SEPSIS)
500.0000 mL | Freq: Once | INTRAVENOUS | Status: AC
  Administered 2020-08-18: 500 mL via INTRAVENOUS

## 2020-08-18 MED ORDER — LORAZEPAM 2 MG/ML IJ SOLN: 0.0000 mg | Freq: Four times a day (QID) | INTRAMUSCULAR | Status: DC

## 2020-08-18 NOTE — Consult Note (Signed)
PHARMACY -  BRIEF ANTIBIOTIC NOTE   Pharmacy has received consult(s) for Cefepime and Vancomcyin from an ED provider.  The patient's profile has been reviewed for ht/wt/allergies/indication/available labs.    One time order(s) placed for Vancomycin 1g x1 dose and cefepime 2g x1 dose.   Further antibiotics/pharmacy consults should be ordered by admitting physician if indicated.                       Thank you, Rowland Lathe 08/18/2020  8:35 PM
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
## 2020-08-18 NOTE — Consult Note (Signed)
CODE SEPSIS - PHARMACY COMMUNICATION  **Broad Spectrum Antibiotics should be administered within 1 hour of Sepsis diagnosis**  Time Code Sepsis Called/Page Received: 2029  Antibiotics Ordered: Vancomycin and Cefepime  Time of 1st antibiotic administration: 2056     Rowland Lathe ,PharmD Clinical Pharmacist  08/18/2020  8:34 PM
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
## 2020-08-19 ENCOUNTER — Other Ambulatory Visit: Payer: Self-pay

## 2020-08-19 ENCOUNTER — Encounter: Payer: Self-pay | Admitting: Internal Medicine

## 2020-08-19 DIAGNOSIS — F1020 Alcohol dependence, uncomplicated: Secondary | ICD-10-CM | POA: Diagnosis not present

## 2020-08-19 DIAGNOSIS — I480 Paroxysmal atrial fibrillation: Secondary | ICD-10-CM

## 2020-08-19 DIAGNOSIS — F10929 Alcohol use, unspecified with intoxication, unspecified: Secondary | ICD-10-CM | POA: Diagnosis not present

## 2020-08-19 DIAGNOSIS — R6510 Systemic inflammatory response syndrome (SIRS) of non-infectious origin without acute organ dysfunction: Secondary | ICD-10-CM | POA: Diagnosis not present

## 2020-08-19 DIAGNOSIS — W19XXXA Unspecified fall, initial encounter: Secondary | ICD-10-CM | POA: Diagnosis not present

## 2020-08-19 DIAGNOSIS — L03818 Cellulitis of other sites: Secondary | ICD-10-CM

## 2020-08-19 LAB — BLOOD CULTURE ID PANEL (REFLEXED) - BCID2

## 2020-08-19 LAB — COMPREHENSIVE METABOLIC PANEL
ALT: 25 U/L (ref 0–44)
AST: 27 U/L (ref 15–41)
Albumin: 3.8 g/dL (ref 3.5–5.0)
Alkaline Phosphatase: 94 U/L (ref 38–126)
Anion gap: 9 (ref 5–15)
BUN: 8 mg/dL (ref 8–23)
CO2: 27 mmol/L (ref 22–32)
Calcium: 8.6 mg/dL — ABNORMAL LOW (ref 8.9–10.3)
Chloride: 102 mmol/L (ref 98–111)
Creatinine, Ser: 0.68 mg/dL (ref 0.61–1.24)
GFR, Estimated: >60 mL/min (ref >60)
Glucose, Bld: 108 mg/dL — ABNORMAL HIGH (ref 70–99)
Potassium: 4 mmol/L (ref 3.5–5.1)
Sodium: 138 mmol/L (ref 135–145)
Total Bilirubin: 1.2 mg/dL (ref 0.3–1.2)
Total Protein: 6.8 g/dL (ref 6.5–8.1)

## 2020-08-19 LAB — CBC
HCT: 39.2 % (ref 39.0–52.0)
Hemoglobin: 13.6 g/dL (ref 13.0–17.0)
MCH: 30.2 pg (ref 26.0–34.0)
MCHC: 34.7 g/dL (ref 30.0–36.0)
MCV: 87.1 fL (ref 80.0–100.0)
Platelets: 329 10*3/uL (ref 150–400)
RBC: 4.5 MIL/uL (ref 4.22–5.81)
RDW: 13.5 % (ref 11.5–15.5)
WBC: 11.6 10*3/uL — ABNORMAL HIGH (ref 4.0–10.5)
nRBC: 0 % (ref 0.0–0.2)

## 2020-08-19 LAB — LACTIC ACID, PLASMA
Lactic Acid, Venous: 1.8 mmol/L (ref 0.5–1.9)
Lactic Acid, Venous: 1.3 mmol/L (ref 0.5–1.9)

## 2020-08-19 LAB — CORTISOL-AM, BLOOD: Cortisol - AM: 16 ug/dL (ref 6.7–22.6)

## 2020-08-19 LAB — PROTIME-INR
INR: 1.1 (ref 0.8–1.2)
Prothrombin Time: 13.4 seconds (ref 11.4–15.2)

## 2020-08-19 LAB — PROCALCITONIN: Procalcitonin: <0.1 ng/mL

## 2020-08-19 MED ORDER — INFLUENZA VAC A&B SA ADJ QUAD 0.5 ML IM PRSY
0.5000 mL | PREFILLED_SYRINGE | INTRAMUSCULAR | Status: DC
  Filled 2020-08-19: qty 0.5

## 2020-08-19 NOTE — Progress Notes (Signed)
Patient becoming more anxious and slightly agitated at bedside although still remaining pleasant with staff. CIWA score 13. Patient given medication per protocol. (See MAR) MD notified.   Thresa Ross, RN

## 2020-08-19 NOTE — Progress Notes (Signed)
Consult was placed to the IV Therapist for a new IV site;  Right arm is contracted;  Left arm assessed thoroughly w ultrasound; no suitable veins noted;  No attempts made;  RN aware and has notified MD

## 2020-08-19 NOTE — Progress Notes (Addendum)
PROGRESS NOTE    Douglas Pesa.  ZOX:[id number] DOB: 06/04/1951 DOA: 08/18/2020 PCP: [name], MD    Brief Narrative:  Douglas Mccubbin. is a 69 y.o. male with medical history significant for hypertension, GERD, depression,PAF not on anticoagulation,alcohol use disorder, chronic right-sided weakness, recently hospitalized from 10/3-10/12 with a diagnosis of SIRS after sepsis was ruled out, having received initial broad-spectrum antibiotics for sepsis of unknown source, who presented to the emergency room after his neighbors called EMS to check on him after he had not been seen in a few days.  EMS found him sitting in the back of his car with blood to the back of his head.  Patient stated he fell in his garage earlier in the day hitting the back of his head and his buttocks.  He denied loss of consciousness.  Stated that since his discharge he has been feeling very weak.  He continues to drink alcohol.  Patient's alcohol level was 156.  Covid and flu negative.  10/17- pt scored 13 on ciwa protocal, threatened to leave AMA  Consultants:     Procedures:   Antimicrobials:       Subjective: When I saw him this am, he was pleasant and cooperative. However was withdrawing later. No sob or cp  Objective: Vitals:   08/18/20 2352 08/19/20 0603 08/19/20 0800 08/19/20 1119  BP: 137/78 (!) 156/70 (!) 143/89 (!) 141/75  Pulse: (!) 103 (!) 110 100 88  Resp: 20 20 18    Temp: 98.1 F (36.7 C) 98.5 F (36.9 C) 99.2 F (37.3 C) 97.7 F (36.5 C)  TempSrc: Oral Oral    SpO2: 96% 94% 96% 95%  Weight:      Height:        Intake/Output Summary (Last 24 hours) at 08/19/2020 1437 Last data filed at 08/19/2020 1405 Gross per 24 hour  Intake 652.86 ml  Output --  Net 652.86 ml   Filed Weights   08/18/20 1720  Weight: 54.4 kg    Examination:  General exam: Appears calm and comfortable  Respiratory system: Clear to auscultation. Respiratory effort normal. Cardiovascular system:  S1 & S2 heard, RRR. No JVD, murmurs, rubs, gallops or clicks. Gastrointestinal system: Abdomen is nondistended, soft and nontender.Normal bowel sounds heard. Central nervous system: awake, alert, grossly intact Extremities: no edema. Has rt hand in fist like motion. No asterexis Skin: warm,dry Psychiatry:  Mood & affect appropriate in current setting.     Data Reviewed: I have personally reviewed following labs and imaging studies  CBC: Recent Labs  Lab 08/18/20 1840 08/19/20 0542  WBC 17.1* 11.6*  NEUTROABS 7.9*  --   HGB 16.0 13.6  HCT 46.9 39.2  MCV 87.3 87.1  PLT 370 811   Basic Metabolic Panel: Recent Labs  Lab 08/13/20 0434 08/18/20 1802 08/18/20 2215 08/19/20 0542  NA 140 135  --  138  K 3.8 4.5  --  4.0  CL 101 97*  --  102  CO2 29 23  --  27  GLUCOSE 106* 96  --  108*  BUN 19 <5*  --  8  CREATININE 0.52* 0.54*  --  0.68  CALCIUM 9.1 8.8*  --  8.6*  MG 2.0  --  1.9  --   PHOS  --   --  2.7  --    GFR: Estimated Creatinine Clearance: 67.1 mL/min (by C-G formula based on SCr of 0.68 mg/dL). Liver Function Tests: Recent Labs  Lab 08/18/20 1802 08/19/20 0542  AST 32 27  ALT 29 25  ALKPHOS 104 94  BILITOT 0.8 1.2  PROT 8.2* 6.8  ALBUMIN 4.5 3.8   No results for input(s): LIPASE, AMYLASE in the last 168 hours. No results for input(s): AMMONIA in the last 168 hours. Coagulation Profile: Recent Labs  Lab 08/19/20 0542  INR 1.1   Cardiac Enzymes: Recent Labs  Lab 08/18/20 1802  CKTOTAL 157   BNP (last 3 results) No results for input(s): PROBNP in the last 8760 hours. HbA1C: No results for input(s): HGBA1C in the last 72 hours. CBG: No results for input(s): GLUCAP in the last 168 hours. Lipid Profile: No results for input(s): CHOL, HDL, LDLCALC, TRIG, CHOLHDL, LDLDIRECT in the last 72 hours. Thyroid Function Tests: No results for input(s): TSH, T4TOTAL, FREET4, T3FREE, THYROIDAB in the last 72 hours. Anemia Panel: No results for input(s):  VITAMINB12, FOLATE, FERRITIN, TIBC, IRON, RETICCTPCT in the last 72 hours. Sepsis Labs: Recent Labs  Lab 08/18/20 1750 08/18/20 1802 08/19/20 0542  PROCALCITON  --   --  <0.10  LATICACIDVEN 5.5* 4.9*  --     Recent Results (from the past 240 hour(s))  Respiratory Panel by RT PCR (Flu A&B, Covid) - Nasopharyngeal Swab     Status: None   Collection Time: 08/18/20  5:52 PM   Specimen: Nasopharyngeal Swab  Result Value Ref Range Status   SARS Coronavirus 2 by RT PCR NEGATIVE NEGATIVE Final    Comment: (NOTE) SARS-CoV-2 target nucleic acids are NOT DETECTED.  The SARS-CoV-2 RNA is generally detectable in upper respiratoy specimens during the acute phase of infection. The lowest concentration of SARS-CoV-2 viral copies this assay can detect is 131 copies/mL. A negative result does not preclude SARS-Cov-2 infection and should not be used as the sole basis for treatment or other patient management decisions. A negative result may occur with  improper specimen collection/handling, submission of specimen other than nasopharyngeal swab, presence of viral mutation(s) within the areas targeted by this assay, and inadequate number of viral copies (<131 copies/mL). A negative result must be combined with clinical observations, patient history, and epidemiological information. The expected result is Negative.  Fact Sheet for Patients:  PinkCheek.be  Fact Sheet for Healthcare Providers:  GravelBags.it  This test is no t yet approved or cleared by the [place] FDA and  has been authorized for detection and/or diagnosis of SARS-CoV-2 by FDA under an Emergency Use Authorization (EUA). This EUA will remain  in effect (meaning this test can be used) for the duration of the COVID-19 declaration under Section 564(b)(1) of the Act, 21 U.S.C. section 360bbb-3(b)(1), unless the authorization is terminated or revoked sooner.      Influenza A by PCR NEGATIVE NEGATIVE Final   Influenza B by PCR NEGATIVE NEGATIVE Final    Comment: (NOTE) The Xpert Xpress SARS-CoV-2/FLU/RSV assay is intended as an aid in  the diagnosis of influenza from Nasopharyngeal swab specimens and  should not be used as a sole basis for treatment. Nasal washings and  aspirates are unacceptable for Xpert Xpress SARS-CoV-2/FLU/RSV  testing.  Fact Sheet for Patients: PinkCheek.be  Fact Sheet for Healthcare Providers: GravelBags.it  This test is not yet approved or cleared by the [place] FDA and  has been authorized for detection and/or diagnosis of SARS-CoV-2 by  FDA under an Emergency Use Authorization (EUA). This EUA will remain  in effect (meaning this test can be used) for the duration of the  Covid-19 declaration under Section 564(b)(1) of the Act,  21  U.S.C. section 360bbb-3(b)(1), unless the authorization is  terminated or revoked. Performed at [facility], [address] [place], [place] [address]   Culture, blood (routine x 2)     Status: None (Preliminary result)   Collection Time: 08/18/20  8:34 PM   Specimen: BLOOD  Result Value Ref Range Status   Specimen Description BLOOD LEFT ANTECUBITAL  Final   Special Requests   Final    BOTTLES DRAWN AEROBIC AND ANAEROBIC Blood Culture adequate volume   Culture   Final    NO GROWTH < 12 HOURS Performed at [facility], [address], [place], [place] [address]    Report Status PENDING  Incomplete  Culture, blood (routine x 2)     Status: None (Preliminary result)   Collection Time: 08/18/20  8:39 PM   Specimen: BLOOD  Result Value Ref Range Status   Specimen Description BLOOD BLOOD RIGHT FOREARM  Final   Special Requests   Final    BOTTLES DRAWN AEROBIC AND ANAEROBIC Blood Culture results may not be optimal due to an inadequate volume of blood received in culture bottles   Culture   Final    NO  GROWTH < 12 HOURS Performed at [facility], [address], [place], [place] [address]    Report Status PENDING  Incomplete         Radiology Studies: DG Pelvis 1-2 Views  Result Date: 08/18/2020 CLINICAL DATA:  Fall, pelvic pain EXAM: PELVIS - 1-2 VIEW COMPARISON:  None. FINDINGS: Mild symmetric degenerative changes in the hips with joint space narrowing and spurring. SI joints symmetric and unremarkable. No acute bony abnormality. Specifically, no fracture, subluxation, or dislocation. IMPRESSION: No acute bony abnormality. Electronically Signed   By: Rolm Baptise M.D.   On: 08/18/2020 18:45   CT Head Wo Contrast  Result Date: 08/18/2020 CLINICAL DATA:  Reported head trauma. Patient found in the back seat of a car with a head laceration. Unwitnessed fall. EXAM: CT HEAD WITHOUT CONTRAST TECHNIQUE: Contiguous axial images were obtained from the base of the skull through the vertex without intravenous contrast. COMPARISON:  07/07/2020 FINDINGS: Brain: No evidence of acute infarction, hemorrhage, hydrocephalus, extra-axial collection or mass lesion/mass effect. There is ventricular sulcal enlargement reflecting mild atrophy. Minor periventricular white matter hypoattenuation is also noted consistent with chronic microvascular ischemic change. Vascular: No hyperdense vessel or unexpected calcification. Skull: Normal. Negative for fracture or focal lesion. Sinuses/Orbits: Globes and orbits are unremarkable. Sinuses are clear. Other: None. IMPRESSION: 1. No acute intracranial abnormalities. 2. No skull fracture. 3. Mild atrophy and minor chronic microvascular ischemic change. Electronically Signed   By: Lajean Manes M.D.   On: 08/18/2020 18:58   DG Chest Portable 1 View  Result Date: 08/18/2020 CLINICAL DATA:  69 year old male with possible sepsis. EXAM: PORTABLE CHEST 1 VIEW COMPARISON:  Chest radiograph dated 08/05/2020. FINDINGS: No focal consolidation, pleural effusion, or  pneumothorax. The cardiac silhouette is within limits. No acute osseous pathology. IMPRESSION: No active disease. Electronically Signed   By: Anner Crete M.D.   On: 08/18/2020 20:58        Scheduled Meds: . enoxaparin (LOVENOX) injection  40 mg Subcutaneous Q24H  . folic acid  1 mg Oral Daily  . [START ON 08/20/2020] influenza vaccine adjuvanted  0.5 mL Intramuscular Tomorrow-1000  . LORazepam  0-4 mg Intravenous Q6H   Or  . LORazepam  0-4 mg Oral Q6H  . [START ON 08/21/2020] LORazepam  0-4 mg Intravenous Q12H  Or  . [START ON 08/21/2020] LORazepam  0-4 mg Oral Q12H  . multivitamin with minerals  1 tablet Oral Daily  . thiamine  100 mg Oral Daily   Or  . thiamine  100 mg Intravenous Daily   Continuous Infusions: . sodium chloride 125 mL/hr at 08/19/20 0854  . cefTRIAXone (ROCEPHIN)  IV Stopped (08/19/20 0857)  . vancomycin Stopped (08/19/20 0957)    Assessment & Plan:   Principal Problem:   Fall Active Problems:   Alcoholic intoxication with complication (HCC)   Paroxysmal A-fib (HCC)   Alcohol use disorder, severe, dependence (HCC)   SIRS (systemic inflammatory response syndrome) (HCC)   Poor social situation   Lactic acidosis   Cellulitis   69 year old male with history of hypertension, GERD, depression,PAF not on anticoagulation,alcohol use disorder, chronic right-sided weakness, recently hospitalized from 10/3-10/12 with a diagnosis of SIRS after sepsis was ruled out, presenting with generalized weakness resulting in a fall.  No injury on work-up   s/p Fall Generalized weakness, with chronic right-sided weakness Poor social situation with inability to care for self, in part related to alcohol abuse -With persistent weakness.  Recently discharged from hospital.  Has also been at SNF not too long ago.   Lives alone.   Continues to drink despite being counseled multiple times during his admission  We will get PT OT  Place on Fall precautions      SIRS  (systemic inflammatory response syndrome) (HCC) Possible sepsis versus lactic acidosis possibly related to alcohol use   Cellulitis versus superficial skin inflammation perineal region -Patient with lactic acidosis and leukocytosis with no obvious septic source and has an incidental perineal erythema Leukocytosis improving Lactic acid improving some. Will ck again -pt did not turn for me to check perineal area. For now will tx as sepsis until r/o  Continue rocephin and vancomycin Hemodynamically stable except mildly tachycardic that could be due to his withdrawal. We will decrease his IV fluids to 75 mL's per hour    Alcoholic intoxication with complication (HCC)   Alcohol use disorder, severe, dependence (HCC) -Currently patient is scoring on withdrawal protocol, at 13  We will continue CIWA withdrawal protocol continue multivitamin, folate and thiamine     Paroxysmal A-fib (HCC) -will resume atenolol  Not on a/c door to history of falls, chronic debility, poor social situation     DVT prophylaxis: Lovenox Code Status: Full Family Communication: None at bedside  Status is: Inpatient  Remains inpatient appropriate because:IV treatments appropriate due to intensity of illness or inability to take PO   Dispo: The patient is from: Home              Anticipated d/c is to: home v.s. snf              Anticipated d/c date is: 2 days              Patient currently is not medically stable to d/c.            LOS: 1 day   Time spent: 35 minutes with more than 50% on [place], MD [facility] Pager [phone]  If 7PM-7AM, please contact night-coverage [email or web address] Password [facility] 08/19/2020, 2:37 PM

## 2020-08-19 NOTE — Progress Notes (Signed)
PHARMACY - PHYSICIAN COMMUNICATION CRITICAL VALUE ALERT - BLOOD CULTURE IDENTIFICATION (BCID)  Douglas Peterson. is an 69 y.o. male who presented to [facility] on 08/18/2020 with a chief complaint of generalized weakness and SIRS.  Assessment:  1/4 bottles (aerobic), GPC, staphylococcus species - S. Epidermidis, MeCA detected. WBC Improved today (17.1 > 11.6) T 97.7 -99.2  (in the past 24 hours)  Name of physician (or Provider) Contacted: Dr. Kurtis Bushman messaged @ ~ [phone]; paged @ ~ [phone]; called number listed on Amion and left number to call back pharmacy. This pharmacist has not heard back from Dr. Kurtis Bushman and at this time have reached out to Dr. Damita Dunnings about results.   Current antibiotics: Vancomycin   Changes to prescribed antibiotics recommended:  [name] on recommended antibiotics - No changes needed (per Dr. Damita Dunnings and to follow up in the morning with Dr. Kurtis Bushman).  No results found for this or any previous visit.  Rowland Lathe 08/19/2020  6:28 PM

## 2020-08-19 NOTE — Progress Notes (Signed)
IV team came to bedside for new IV placement. IV team RN unable to find vein on patient with guided IV ultrasound. MD made aware.   Thresa Ross, RN

## 2020-08-19 NOTE — Progress Notes (Signed)
Pharmacy Antibiotic Note  Douglas Peterson. is a 69 y.o. male admitted on 08/18/2020 with sepsis.  Pharmacy has been consulted for Vancomycin dosing.  Plan: Vancomycin 1gm x 1 then 761m IV q12hrs (has been on this dose recently)  Height: 5' 4"  (162.6 cm) Weight: 54.4 kg (120 lb) IBW/kg (Calculated) : 59.2  Temp (24hrs), Avg:98.1 F (36.7 C), Min:98 F (36.7 C), Max:98.1 F (36.7 C)  Recent Labs  Lab 08/12/20 0454 08/13/20 0434 08/18/20 1750 08/18/20 1802 08/18/20 1840  WBC  --   --   --   --  17.1*  CREATININE 0.51* 0.52*  --  0.54*  --   LATICACIDVEN  --   --  5.5* 4.9*  --     Estimated Creatinine Clearance: 67.1 mL/min (A) (by C-G formula based on SCr of 0.54 mg/dL (L)).    Allergies  Allergen Reactions  . Hydrochlorothiazide Other (See Comments)    Hyponatremia    Antimicrobials this admission:   >>    >>   Dose adjustments this admission:   Microbiology results:  BCx:   UCx:    Sputum:    MRSA PCR:   Thank you for allowing pharmacy to be a part of this patient's care.  HHart RobinsonsA 08/19/2020 12:41 AM

## 2020-08-19 NOTE — TOC Initial Note (Addendum)
Transition of Care (TOC) - Initial/Assessment Note    Patient Details  Name: Douglas Peterson. MRN: [id number] Date of Birth: [birth year]  [facility]) CM/SW Contact:    Harriet Masson, RN Phone [phone] 08/19/2020, 1:37 PM  Clinical Narrative:                   Expected Discharge Plan: Home/Self Care (Pending) Barriers to Discharge: Continued Medical Work up   Patient Goals and CMS Choice  RN spoke with pt at bedside and the daughter (Wendy-telephonically) and introduced TOC services and the purpose for today's visit. Reviewed and explained there risk of substance abuse and how this can continue to affect his health. Provided local resources to assist if pt wishes to cease drinking. Pt states she has plenty of food once discharged home even received Meals on Wheels. Pt lives in a single family home alone with available assistance (daughter-Wendy and sister-Sandra Cheek). Pt continues to drive for transportation and able to get to all her providers appointments. Pt uses a cane when ambulating and also has a rolling walker. Pt states he can obtain and afford all his medications.   Daughter verifies some of the information above but was very adamant about pt's non-adherence with participating with [facility] in the past. States pt's only interest is drinking and he does not follow up once discharge with any of the agencies once he gets home regardless of what he states. Further reports DSS ([employer]) will present his case in court next month for court appointed guardianship. Daughter indicates pt has stated he will fight her in court if she is chosen for guardianship. There is also a sister Lance Sell)  who is here for the season (fall/winter) who will participate in caring for this pt.   Notes have indicated [facility] in the past but once again the pt never follows up for services.   TOC team will continue to follow for possible discharge needs.  Raina Mina, RN Care Management Coordinator [place] Office [phone]      Expected Discharge Plan and Services Expected Discharge Plan: Home/Self Care (Pending)       Living arrangements for the past 2 months: Single Family Home                 DME Arranged: N/A DME Agency: NA                  Prior Living Arrangements/Services Living arrangements for the past 2 months: Single Family Home Lives with:: Self Patient language and need for interpreter reviewed:: Yes        Need for Family Participation in Patient Care: Yes (Comment) (Continues to drink may support for ongonig safety measures)        Activities of Daily Living Home Assistive Devices/Equipment: None ADL Screening (condition at time of admission) Patient's cognitive ability adequate to safely complete daily activities?: Yes Is the patient deaf or have difficulty hearing?: No Does the patient have difficulty seeing, even when wearing glasses/contacts?: No Does the patient have difficulty concentrating, remembering, or making decisions?: Yes Patient able to express need for assistance with ADLs?: Yes Does the patient have difficulty dressing or bathing?: No Independently performs ADLs?: Yes (appropriate for developmental age) Does the patient have difficulty walking or climbing stairs?: Yes Weakness of Legs: None Weakness of Arms/Hands: None  Permission Sought/Granted  Emotional Assessment Appearance:: Appears stated age Attitude/Demeanor/Rapport: Engaged Affect (typically observed): Calm Orientation: : Oriented to Self, Oriented to Place, Oriented to  Time, Oriented to Situation Alcohol / Substance Use: Alcohol Use Psych Involvement: No (comment)  Admission diagnosis:  Alcohol abuse [F10.10] SIRS (systemic inflammatory response syndrome) (HCC) [R65.10] Injury of head, initial encounter [S09.90XA] Patient Active Problem List   Diagnosis Date Noted  .  Poor social situation 08/18/2020  . Lactic acidosis 08/18/2020  . Cellulitis 08/18/2020  . SIRS (systemic inflammatory response syndrome) ([place]) 08/05/2020  . Right hip pain 08/05/2020  . Hypophosphatemia 08/05/2020  . Closed fracture of distal clavicle_Right 06/05/2020  . GERD (gastroesophageal reflux disease) 06/05/2020  . Alcohol use disorder, severe, dependence ([place]) 05/28/2020  . Acquired thrombophilia ([place])   . Elevated LFTs 04/06/2020  . Fall 04/06/2020  . Rhabdomyolysis 04/06/2020  . Elevated lactic acid level 04/06/2020  . Sepsis ([place]) 03/17/2020  . Constipation 03/17/2020  . Paroxysmal A-fib ([place]) 03/17/2020  . Severe protein-calorie malnutrition ([place]) 02/25/2020  . Left inguinal hernia 01/31/2020  . Encounter for competency evaluation   . Depression 01/17/2020  . Esophagitis   . Nausea without vomiting   . Weakness   . Tremor   . Pancytopenia ([place])   . Hypomagnesemia   . Alcoholic cirrhosis of liver without ascites ([place])   . Thrombocytopenia ([place])   . Non-traumatic rhabdomyolysis   . Alcohol withdrawal delirium ([place]) 12/08/2019  . ETOH abuse   . Alcoholic intoxication with complication ([place])   . 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 Hypokalemia 09/27/2019  . Dysphagia 09/27/2019  . Alcohol withdrawal delirium, acute, hyperactive ([place]) 09/21/2019  . Pressure injury of ankle, stage 1 08/15/2019  . Goals of care, counseling/discussion   . Palliative care by specialist   . Alcohol withdrawal ([place]) 03/20/2019  . Low back pain 11/15/2018  . Acute metabolic encephalopathy 71/21/9758  . Delirium tremens ([place]) 03/08/2018  . Malnutrition of moderate degree 11/24/2017  . HTN (hypertension) 09/16/2017  . Hypothermia 12/17/2016  . Alcohol use   . Diarrhea   . Hyponatremia 07/09/2015   PCP:  [name], MD Pharmacy:   [facility] [address] [place] [address] [place] [address] Phone:  [phone] Fax: [phone]     Social Determinants of Health (SDOH) Interventions    Readmission Risk Interventions Readmission Risk Prevention Plan 08/19/2020 08/14/2020 05/19/2020  Transportation Screening Complete Complete Complete  PCP or Specialist Appt within 3-5 Days - - -  HRI or [place] - - -  Palliative Care Screening - - -  Medication Review (RN Care Manager) Complete - Complete  PCP or Specialist appointment within 3-5 days of discharge Complete Complete Complete  HRI or [place] - Complete Complete  SW Recovery Care/Counseling Consult Complete Complete Complete  Palliative Care Screening Not Applicable Not Applicable Not [place] - Patient Refused Not Applicable  Some recent data might be hidden

## 2020-08-19 NOTE — Progress Notes (Signed)
Mobility Specialist - Progress Note   08/19/20 1200  Mobility  Activity Ambulated to bathroom  Level of Assistance Moderate assist, patient does 50-74%  Assistive Device None (IV pole, contact guard)  Distance Ambulated (ft) 20 ft  Mobility Response Tolerated well  Mobility performed by Mobility specialist  $Mobility charge 1 Mobility    Pre-mobility: 100 HR, 98% SpO2 During mobility: 112 HR, 98% SpO2 Post-mobility: 97 HR, 97% SpO2   Pt was sitting EOB with nurse present upon arrival. Pt agreed to session. Pt was modA standing from EOB and required steadying CGA/modA during ambulation. Pt ambulated 20' to/from bathroom where pt had urinal output. Pt presented with shaky/unsteady gait this date as well, however, no LOB noted. Pt was able to perform hygiene tasks with close supervision. Once getting back to bed, pt repeatedly trying to get OOB to search for personal belongings: shoes, keys. Mobility tech assisted in search, but no items of description were found. Pt was instructed to use call bell and wait for assistance before trying to get OOB. Pt showed understanding. Overall, pt tolerated session well. Pt was left in bed with alarm set and all needs in reach.   Kathee Delton Mobility Specialist 08/19/20, 12:59 PM

## 2020-08-19 NOTE — Progress Notes (Signed)
Patient lost IV access due to being pulled by patient. Patient became very anxious at bedside. CIWA monitoring q 4 hours per protocol. MD was notified by charge Minette Brine, RN. IC order was placed by MD. IV fluids stopped. IV access team consulted. Awaiting placement.   Douglas Peterson

## 2020-08-20 DIAGNOSIS — Z659 Problem related to unspecified psychosocial circumstances: Secondary | ICD-10-CM

## 2020-08-20 DIAGNOSIS — F10929 Alcohol use, unspecified with intoxication, unspecified: Secondary | ICD-10-CM | POA: Diagnosis not present

## 2020-08-20 DIAGNOSIS — R6510 Systemic inflammatory response syndrome (SIRS) of non-infectious origin without acute organ dysfunction: Secondary | ICD-10-CM | POA: Diagnosis not present

## 2020-08-20 DIAGNOSIS — W19XXXA Unspecified fall, initial encounter: Secondary | ICD-10-CM | POA: Diagnosis not present

## 2020-08-20 DIAGNOSIS — F1020 Alcohol dependence, uncomplicated: Secondary | ICD-10-CM | POA: Diagnosis not present

## 2020-08-20 LAB — CBC
HCT: 36.5 % — ABNORMAL LOW (ref 39.0–52.0)
Hemoglobin: 12.4 g/dL — ABNORMAL LOW (ref 13.0–17.0)
MCH: 30.4 pg (ref 26.0–34.0)
MCHC: 34 g/dL (ref 30.0–36.0)
MCV: 89.5 fL (ref 80.0–100.0)
Platelets: 226 10*3/uL (ref 150–400)
RBC: 4.08 MIL/uL — ABNORMAL LOW (ref 4.22–5.81)
RDW: 13.3 % (ref 11.5–15.5)
WBC: 5.4 10*3/uL (ref 4.0–10.5)
nRBC: 0 % (ref 0.0–0.2)

## 2020-08-20 LAB — CREATININE, SERUM
Creatinine, Ser: 0.56 mg/dL — ABNORMAL LOW (ref 0.61–1.24)
GFR, Estimated: >60 mL/min (ref >60)

## 2020-08-20 LAB — PATHOLOGIST SMEAR REVIEW

## 2020-08-20 MED ORDER — NYSTATIN 100000 UNIT/GM EX OINT
TOPICAL_OINTMENT | Freq: Two times a day (BID) | CUTANEOUS | Status: DC
  Administered 2020-08-20 – 2020-08-22 (×4): 1 via TOPICAL
  Filled 2020-08-20 (×2): qty 15

## 2020-08-20 MED ORDER — FLUCONAZOLE IN SODIUM CHLORIDE 200-0.9 MG/100ML-% IV SOLN
200.0000 mg | Freq: Once | INTRAVENOUS | Status: AC
  Administered 2020-08-20: 200 mg via INTRAVENOUS
  Filled 2020-08-20: qty 100

## 2020-08-20 MED ORDER — FLUCONAZOLE 50 MG PO TABS
150.0000 mg | ORAL_TABLET | Freq: Every day | ORAL | Status: AC
  Administered 2020-08-21 – 2020-08-23 (×3): 150 mg via ORAL
  Filled 2020-08-20 (×3): qty 1

## 2020-08-20 MED ORDER — POLYETHYLENE GLYCOL 3350 17 G PO PACK
17.0000 g | PACK | Freq: Every day | ORAL | Status: DC
  Administered 2020-08-20 – 2020-08-23 (×4): 17 g via ORAL
  Filled 2020-08-20 (×4): qty 1

## 2020-08-20 MED ORDER — ATENOLOL 25 MG PO TABS
25.0000 mg | ORAL_TABLET | Freq: Every day | ORAL | Status: DC
  Administered 2020-08-20 – 2020-08-23 (×4): 25 mg via ORAL
  Filled 2020-08-20 (×4): qty 1

## 2020-08-20 NOTE — Progress Notes (Signed)
Mobility Specialist - Progress Note   08/20/20 1208  Mobility  Activity Ambulated in room  Level of Assistance Contact guard assist, steadying assist  Assistive Device Front wheel walker  Distance Ambulated (ft) 80 ft  Mobility Response Tolerated well  Mobility performed by Mobility specialist  $Mobility charge 1 Mobility    Pt laying in bed w/ sitter present in room upon arrival. Pt agreed to session. Pt mod. Independent w/ bed mobility. Pt needed CGA for S2S d/t R side weakness. Pt ambulated 24' total in room using a RW. Pt had unsteady gait d/t R sided weakness. CGA utilized for steadying and safety. VCs required for safety and proper biomechanics. Pt encouraged to avoid picking up RW and to keep it close for safety. LOB noted only when turning. LOB corrected by VC. Overall, pt tolerated session well. Pt pleasant and motivated t/o session. Pt left laying in bed w/ all needs placed in reach and sitter present in room.       Mobility Specialist  08/20/20, 12:14 PM

## 2020-08-20 NOTE — Progress Notes (Addendum)
PROGRESS NOTE    Douglas Peterson.  FOY:[id number] DOB: [birth year] DOA: 08/18/2020 PCP: [name], MD    Brief Narrative:  Douglas Peterson. is a 69 y.o. male with medical history significant for hypertension, GERD, depression,PAF not on anticoagulation,alcohol use disorder, chronic right-sided weakness, recently hospitalized from 10/3-10/12 with a diagnosis of SIRS after sepsis was ruled out, having received initial broad-spectrum antibiotics for sepsis of unknown source, who presented to the emergency room after his neighbors called EMS to check on him after he had not been seen in a few days.  EMS found him sitting in the back of his car with blood to the back of his head.  Patient stated he fell in his garage earlier in the day hitting the back of his head and his buttocks.  He denied loss of consciousness.  Stated that since his discharge he has been feeling very weak.  He continues to drink alcohol.  Patient's alcohol level was 156.  Covid and flu negative.  10/17- pt scored 13 on ciwa protocal, threatened to leave AMA- 10/18- IVC papers signed yesterday as pt was confused and wanted to leave. Score is 1 on CIWA protocal this am.   Consultants:     Procedures:   Antimicrobials:   vancomycin    Subjective: [name] calmer today.  Has no complaints.  Per nursing he scored 1 on CIWA protocol.  Objective: Vitals:   08/19/20 2026 08/20/20 0001 08/20/20 0344 08/20/20 0732  BP: (!) 131/93 (!) 143/81 (!) 143/73 (!) 146/76  Pulse: 69 72 72 81  Resp: 16 20 18 20   Temp: 98.6 F (37 C) 98.5 F (36.9 C) 99.3 F (37.4 C) 99.6 F (37.6 C)  TempSrc:  Oral Oral Oral  SpO2: 97% 95% 96% 95%  Weight:      Height:        Intake/Output Summary (Last 24 hours) at 08/20/2020 0825 Last data filed at 08/20/2020 7672 Gross per 24 hour  Intake 1585.17 ml  Output 400 ml  Net 1185.17 ml   Filed Weights   08/18/20 1720  Weight: 54.4 kg    Examination:     Gen: calm,  nad Cta no r/r/w rrr s1/s2 no m/r Soft benign +bs Perineal area intense redness. Including scrotum Trace edema b/l Awake, unable to assess neuro exam Mood and affect appropriate in current setting   Data Reviewed: I have personally reviewed following labs and imaging studies  CBC: Recent Labs  Lab 08/18/20 1840 08/19/20 0542 08/20/20 0505  WBC 17.1* 11.6* 5.4  NEUTROABS 7.9*  --   --   HGB 16.0 13.6 12.4*  HCT 46.9 39.2 36.5*  MCV 87.3 87.1 89.5  PLT 370 329 094   Basic Metabolic Panel: Recent Labs  Lab 08/18/20 1802 08/18/20 2215 08/19/20 0542 08/20/20 0505  NA 135  --  138  --   K 4.5  --  4.0  --   CL 97*  --  102  --   CO2 23  --  27  --   GLUCOSE 96  --  108*  --   BUN <5*  --  8  --   CREATININE 0.54*  --  0.68 0.56*  CALCIUM 8.8*  --  8.6*  --   MG  --  1.9  --   --   PHOS  --  2.7  --   --    GFR: Estimated Creatinine Clearance: 67.1 mL/min (A) (by C-G formula based on SCr of  0.56 mg/dL (L)). Liver Function Tests: Recent Labs  Lab 08/18/20 1802 08/19/20 0542  AST 32 27  ALT 29 25  ALKPHOS 104 94  BILITOT 0.8 1.2  PROT 8.2* 6.8  ALBUMIN 4.5 3.8   No results for input(s): LIPASE, AMYLASE in the last 168 hours. No results for input(s): AMMONIA in the last 168 hours. Coagulation Profile: Recent Labs  Lab 08/19/20 0542  INR 1.1   Cardiac Enzymes: Recent Labs  Lab 08/18/20 1802  CKTOTAL 157   BNP (last 3 results) No results for input(s): PROBNP in the last 8760 hours. HbA1C: No results for input(s): HGBA1C in the last 72 hours. CBG: No results for input(s): GLUCAP in the last 168 hours. Lipid Profile: No results for input(s): CHOL, HDL, LDLCALC, TRIG, CHOLHDL, LDLDIRECT in the last 72 hours. Thyroid Function Tests: No results for input(s): TSH, T4TOTAL, FREET4, T3FREE, THYROIDAB in the last 72 hours. Anemia Panel: No results for input(s): VITAMINB12, FOLATE, FERRITIN, TIBC, IRON, RETICCTPCT in the last 72 hours. Sepsis Labs: Recent  Labs  Lab 08/18/20 1750 08/18/20 1802 08/19/20 0542 08/19/20 1526 08/19/20 1739  PROCALCITON  --   --  <0.10  --   --   LATICACIDVEN 5.5* 4.9*  --  1.8 1.3    Recent Results (from the past 240 hour(s))  Respiratory Panel by RT PCR (Flu A&B, Covid) - Nasopharyngeal Swab     Status: None   Collection Time: 08/18/20  5:52 PM   Specimen: Nasopharyngeal Swab  Result Value Ref Range Status   SARS Coronavirus 2 by RT PCR NEGATIVE NEGATIVE Final    Comment: (NOTE) SARS-CoV-2 target nucleic acids are NOT DETECTED.  The SARS-CoV-2 RNA is generally detectable in upper respiratoy specimens during the acute phase of infection. The lowest concentration of SARS-CoV-2 viral copies this assay can detect is 131 copies/mL. A negative result does not preclude SARS-Cov-2 infection and should not be used as the sole basis for treatment or other patient management decisions. A negative result may occur with  improper specimen collection/handling, submission of specimen other than nasopharyngeal swab, presence of viral mutation(s) within the areas targeted by this assay, and inadequate number of viral copies (<131 copies/mL). A negative result must be combined with clinical observations, patient history, and epidemiological information. The expected result is Negative.  Fact Sheet for Patients:  PinkCheek.be  Fact Sheet for Healthcare Providers:  GravelBags.it  This test is no t yet approved or cleared by the [place] FDA and  has been authorized for detection and/or diagnosis of SARS-CoV-2 by FDA under an Emergency Use Authorization (EUA). This EUA will remain  in effect (meaning this test can be used) for the duration of the COVID-19 declaration under Section 564(b)(1) of the Act, 21 U.S.C. section 360bbb-3(b)(1), unless the authorization is terminated or revoked sooner.     Influenza A by PCR NEGATIVE NEGATIVE Final   Influenza  B by PCR NEGATIVE NEGATIVE Final    Comment: (NOTE) The Xpert Xpress SARS-CoV-2/FLU/RSV assay is intended as an aid in  the diagnosis of influenza from Nasopharyngeal swab specimens and  should not be used as a sole basis for treatment. Nasal washings and  aspirates are unacceptable for Xpert Xpress SARS-CoV-2/FLU/RSV  testing.  Fact Sheet for Patients: PinkCheek.be  Fact Sheet for Healthcare Providers: GravelBags.it  This test is not yet approved or cleared by the [place] FDA and  has been authorized for detection and/or diagnosis of SARS-CoV-2 by  FDA under an Emergency Use Authorization (EUA).  This EUA will remain  in effect (meaning this test can be used) for the duration of the  Covid-19 declaration under Section 564(b)(1) of the Act, 21  U.S.C. section 360bbb-3(b)(1), unless the authorization is  terminated or revoked. Performed at [facility], [address] [place], [place] [address]   Culture, blood (routine x 2)     Status: None (Preliminary result)   Collection Time: 08/18/20  8:34 PM   Specimen: BLOOD  Result Value Ref Range Status   Specimen Description BLOOD LEFT ANTECUBITAL  Final   Special Requests   Final    BOTTLES DRAWN AEROBIC AND ANAEROBIC Blood Culture adequate volume   Culture  Setup Time   Final    Organism ID to follow GRAM POSITIVE COCCI AEROBIC BOTTLE ONLY CRITICAL RESULT CALLED TO, READ BACK BY AND VERIFIED WITH: [facility] DUNCAN AT [place] 08/19/20.PMF Performed at [facility], [address] [place], [place] [address]    Culture [facility] POSITIVE COCCI  Final   Report Status PENDING  Incomplete  Blood Culture ID Panel (Reflexed)     Status: Abnormal   Collection Time: 08/18/20  8:34 PM  Result Value Ref Range Status   Enterococcus faecalis NOT DETECTED NOT DETECTED Final   Enterococcus Faecium NOT DETECTED NOT DETECTED Final   Listeria monocytogenes NOT DETECTED NOT  DETECTED Final   Staphylococcus species DETECTED (A) NOT DETECTED Final    Comment: CRITICAL RESULT CALLED TO, READ BACK BY AND VERIFIED WITH: [facility] DUNCAN AT 1826 08/19/20.PMF    Staphylococcus aureus (BCID) NOT DETECTED NOT DETECTED Final   Staphylococcus epidermidis DETECTED (A) NOT DETECTED Final    Comment: Methicillin (oxacillin) resistant coagulase negative staphylococcus. Possible blood culture contaminant (unless isolated from more than one blood culture draw or clinical case suggests pathogenicity). No antibiotic treatment is indicated for blood  culture contaminants. CRITICAL RESULT CALLED TO, READ BACK BY AND VERIFIED WITH: [facility] DUNCAN AT [place] 08/19/20.PMF    Staphylococcus lugdunensis NOT DETECTED NOT DETECTED Final   Streptococcus species NOT DETECTED NOT DETECTED Final   Streptococcus agalactiae NOT DETECTED NOT DETECTED Final   Streptococcus pneumoniae NOT DETECTED NOT DETECTED Final   Streptococcus pyogenes NOT DETECTED NOT DETECTED Final   A.calcoaceticus-baumannii NOT DETECTED NOT DETECTED Final   Bacteroides fragilis NOT DETECTED NOT DETECTED Final   Enterobacterales NOT DETECTED NOT DETECTED Final   Enterobacter cloacae complex NOT DETECTED NOT DETECTED Final   Escherichia coli NOT DETECTED NOT DETECTED Final   Klebsiella aerogenes NOT DETECTED NOT DETECTED Final   Klebsiella oxytoca NOT DETECTED NOT DETECTED Final   Klebsiella pneumoniae NOT DETECTED NOT DETECTED Final   Proteus species NOT DETECTED NOT DETECTED Final   Salmonella species NOT DETECTED NOT DETECTED Final   Serratia marcescens NOT DETECTED NOT DETECTED Final   Haemophilus influenzae NOT DETECTED NOT DETECTED Final   Neisseria meningitidis NOT DETECTED NOT DETECTED Final   Pseudomonas aeruginosa NOT DETECTED NOT DETECTED Final   Stenotrophomonas maltophilia NOT DETECTED NOT DETECTED Final   Candida albicans NOT DETECTED NOT DETECTED Final   Candida auris NOT DETECTED NOT DETECTED Final    Candida glabrata NOT DETECTED NOT DETECTED Final   Candida krusei NOT DETECTED NOT DETECTED Final   Candida parapsilosis NOT DETECTED NOT DETECTED Final   Candida tropicalis NOT DETECTED NOT DETECTED Final   Cryptococcus neoformans/gattii NOT DETECTED NOT DETECTED Final   Methicillin resistance mecA/C DETECTED (A) NOT DETECTED Final    Comment: CRITICAL RESULT CALLED TO, READ BACK BY AND VERIFIED  WITH: [facility] DUNCAN AT 1826 08/19/20.PMF Performed at [facility], [address] [place], [place] [address]   Culture, blood (routine x 2)     Status: None (Preliminary result)   Collection Time: 08/18/20  8:39 PM   Specimen: BLOOD  Result Value Ref Range Status   Specimen Description BLOOD BLOOD RIGHT FOREARM  Final   Special Requests   Final    BOTTLES DRAWN AEROBIC AND ANAEROBIC Blood Culture results may not be optimal due to an inadequate volume of blood received in culture bottles   Culture   Final    NO GROWTH 2 DAYS Performed at [facility], [address], [place], [place] [address]    Report Status PENDING  Incomplete         Radiology Studies: DG Pelvis 1-2 Views  Result Date: 08/18/2020 CLINICAL DATA:  Fall, pelvic pain EXAM: PELVIS - 1-2 VIEW COMPARISON:  None. FINDINGS: Mild symmetric degenerative changes in the hips with joint space narrowing and spurring. SI joints symmetric and unremarkable. No acute bony abnormality. Specifically, no fracture, subluxation, or dislocation. IMPRESSION: No acute bony abnormality. Electronically Signed   By: Rolm Baptise M.D.   On: 08/18/2020 18:45   CT Head Wo Contrast  Result Date: 08/18/2020 CLINICAL DATA:  Reported head trauma. Patient found in the back seat of a car with a head laceration. Unwitnessed fall. EXAM: CT HEAD WITHOUT CONTRAST TECHNIQUE: Contiguous axial images were obtained from the base of the skull through the vertex without intravenous contrast. COMPARISON:  07/07/2020 FINDINGS: Brain: No evidence  of acute infarction, hemorrhage, hydrocephalus, extra-axial collection or mass lesion/mass effect. There is ventricular sulcal enlargement reflecting mild atrophy. Minor periventricular white matter hypoattenuation is also noted consistent with chronic microvascular ischemic change. Vascular: No hyperdense vessel or unexpected calcification. Skull: Normal. Negative for fracture or focal lesion. Sinuses/Orbits: Globes and orbits are unremarkable. Sinuses are clear. Other: None. IMPRESSION: 1. No acute intracranial abnormalities. 2. No skull fracture. 3. Mild atrophy and minor chronic microvascular ischemic change. Electronically Signed   By: Lajean Manes M.D.   On: 08/18/2020 18:58   DG Chest Portable 1 View  Result Date: 08/18/2020 CLINICAL DATA:  69 year old male with possible sepsis. EXAM: PORTABLE CHEST 1 VIEW COMPARISON:  Chest radiograph dated 08/05/2020. FINDINGS: No focal consolidation, pleural effusion, or pneumothorax. The cardiac silhouette is within limits. No acute osseous pathology. IMPRESSION: No active disease. Electronically Signed   By: Anner Crete M.D.   On: 08/18/2020 20:58        Scheduled Meds: . enoxaparin (LOVENOX) injection  40 mg Subcutaneous Q24H  . folic acid  1 mg Oral Daily  . influenza vaccine adjuvanted  0.5 mL Intramuscular Tomorrow-1000  . LORazepam  0-4 mg Intravenous Q6H   Or  . LORazepam  0-4 mg Oral Q6H  . [START ON 08/21/2020] LORazepam  0-4 mg Intravenous Q12H   Or  . [START ON 08/21/2020] LORazepam  0-4 mg Oral Q12H  . multivitamin with minerals  1 tablet Oral Daily  . thiamine  100 mg Oral Daily   Or  . thiamine  100 mg Intravenous Daily   Continuous Infusions: . sodium chloride Stopped (08/19/20 1453)  . cefTRIAXone (ROCEPHIN)  IV 2 g (08/20/20 0421)  . vancomycin 750 mg (08/20/20 0023)    Assessment & Plan:   Principal Problem:   Fall Active Problems:   Alcoholic intoxication with complication ([place])   Paroxysmal A-fib (HCC)    Alcohol use disorder, severe, dependence ([place])  SIRS (systemic inflammatory response syndrome) (HCC)   Poor social situation   Lactic acidosis   Cellulitis   69 year old male with history of hypertension, GERD, depression,PAF not on anticoagulation,alcohol use disorder, chronic right-sided weakness, recently hospitalized from 10/3-10/12 with a diagnosis of SIRS after sepsis was ruled out, presenting with generalized weakness resulting in a fall.  No injury on work-up   s/p Fall Generalized weakness, with chronic right-sided weakness Poor social situation with inability to care for self, in part related to alcohol abuse -With persistent weakness.  Recently discharged from hospital.  Has also been at SNF not too long ago.   Lives alone.   -Continues to drink  Has been counseled many times during his admissions about alcohol cessation  PT OT consulted  On fall precautions     SIRS (systemic inflammatory response syndrome) (HCC) Possible sepsis versus lactic acidosis possibly related to alcohol use  Cellulitis versus superficial skin inflammation perineal region -Patient with lactic acidosis and leukocytosis with no obvious septic source and has an incidental perineal erythema SIRS, sepsis ruled out. Perineal area likely moisture related to yeast infection. Doubt cellulitis, will d/c vancomycin Will keep area dry, start fluconazole po x3 days Nystatin ointment Did receive one dose of iv fluconazole Leukocytosis resolved Lactic acid improved 1 cx only with staph epi.    Alcoholic intoxication with complication ([place])   Alcohol use disorder, severe, dependence ([place]) On CIWA protocol Was scoring yesterday today only scored 1, will continue to monitor Continue MVI, folate, and thiamine     Paroxysmal A-fib (HCC) On atenol 78m  Qd No on a/c due to alcohol abuse, falls, chronic debility, poor social situation      DVT prophylaxis: Lovenox Code Status: Full Family  Communication: None at bedside  Status is: Inpatient  Remains inpatient appropriate because:IV treatments appropriate due to intensity of illness or inability to take PO   Dispo: The patient is from: Home              Anticipated d/c is to: home v.s. snf, currently on ivc              Anticipated d/c date is: 2 days              Patient currently is not medically stable to d/c.            LOS: 2 days   Time spent: 35 minutes with more than 50% on [place] MD [facility] Pager [phone]  If 7PM-7AM, please contact night-coverage [email or web address] Password [email or web address] 08/20/2020, 8:25 AM

## 2020-08-20 NOTE — Plan of Care (Signed)
Pt awakens to voice. Follows commands and able to voice his needs. Calm and cooperative. Sitter remains at bedside. Vitals stable. IV placed at midnight and antibiotics and IVF restarted. Peri care performed to reddened groin/scrotal area. No Ativan administered overnight as pt mostly calm. Safety measures in place. Will continue to monitor.  Problem: Education: Goal: Knowledge of General Education information will improve Description: Including pain rating scale, medication(s)/side effects and non-pharmacologic comfort measures Outcome: Progressing   Problem: Health Behavior/Discharge Planning: Goal: Ability to manage health-related needs will improve Outcome: Progressing   Problem: Clinical Measurements: Goal: Ability to maintain clinical measurements within normal limits will improve Outcome: Progressing Goal: Will remain free from infection Outcome: Progressing Goal: Diagnostic test results will improve Outcome: Progressing Goal: Respiratory complications will improve Outcome: Progressing Goal: Cardiovascular complication will be avoided Outcome: Progressing   Problem: Activity: Goal: Risk for activity intolerance will decrease Outcome: Progressing   Problem: Nutrition: Goal: Adequate nutrition will be maintained Outcome: Progressing   Problem: Coping: Goal: Level of anxiety will decrease Outcome: Progressing   Problem: Elimination: Goal: Will not experience complications related to bowel motility Outcome: Progressing Goal: Will not experience complications related to urinary retention Outcome: Progressing   Problem: Pain Managment: Goal: General experience of comfort will improve Outcome: Progressing   Problem: Safety: Goal: Ability to remain free from injury will improve Outcome: Progressing   Problem: Skin Integrity: Goal: Risk for impaired skin integrity will decrease Outcome: Progressing

## 2020-08-20 NOTE — Progress Notes (Signed)
Patient red in groin area and tip of penis and slight swelling on left side of groin. Requesting cream for patient. MD made aware. MD at bedside.   Thresa Ross, RN

## 2020-08-21 DIAGNOSIS — L03818 Cellulitis of other sites: Secondary | ICD-10-CM | POA: Diagnosis not present

## 2020-08-21 DIAGNOSIS — F10929 Alcohol use, unspecified with intoxication, unspecified: Secondary | ICD-10-CM | POA: Diagnosis not present

## 2020-08-21 DIAGNOSIS — W19XXXA Unspecified fall, initial encounter: Secondary | ICD-10-CM | POA: Diagnosis not present

## 2020-08-21 DIAGNOSIS — F1020 Alcohol dependence, uncomplicated: Secondary | ICD-10-CM | POA: Diagnosis not present

## 2020-08-21 LAB — CULTURE, BLOOD (ROUTINE X 2): Special Requests: ADEQUATE

## 2020-08-21 NOTE — Care Management Important Message (Signed)
Important Message  Patient Details  Name: Douglas Peterson. MRN: [id number] Date of Birth: 1951/02/10   Medicare Important Message Given:  Yes     [name] 08/21/2020, 11:08 AM

## 2020-08-21 NOTE — Progress Notes (Signed)
Mobility Specialist - Progress Note   08/21/20 1323  Mobility  Activity Ambulated in room  Level of Assistance Minimal assist, patient does 75% or more  Assistive Device Front wheel walker  Distance Ambulated (ft) 60 ft  Mobility Response Tolerated well  Mobility performed by Mobility specialist  $Mobility charge 1 Mobility    Pt sleeping in bed w/ sitter present in room upon arrival. Pt easily awakens. Pt agreed to session. Pt transitioned from supine to sit SBA. Noticed bed linens soiled d/t urinal incontinence. Sitter assisted w/ cleaning and getting new sheets. Pt slightly confused. At EOB, pt states "it's a bad time to drink some beer", and "I shouldn't have drank so much beer last night". Pt able to proceed w/ session, able to follow commands. Pt ambulated ~60' total in room w/ min. Assist for steadying. Pt had unsteady gait d/t R side weakness. Overall, pt tolerated session well. No c/o pain. Pt left laying in bed w/ sitter present in room. All needs placed in reach.       Mobility Specialist  08/21/20, 1:43 PM

## 2020-08-21 NOTE — Progress Notes (Signed)
PROGRESS NOTE    Douglas Peterson.  UUE:[id number] DOB: 1950-12-17 DOA: 08/18/2020 PCP: [name], MD    Brief Narrative:  Douglas Petersonis a 69 y.o.malewith medical history significant forhypertension, GERD, depression,PAFnot on anticoagulation,alcohol use disorder, chronic right-sided weakness, recently hospitalized from 10/3-10/12with a diagnosis of SIRS after sepsis was ruled out, having received initial broad-spectrum antibiotics for sepsis of unknown source, who presented to the emergency room after his neighbors called EMS to check on him after he had not been seen in a few days. EMS found him sitting in the back of his car with blood to the back of his head. Patient stated he fell in his garage earlier in the day hitting the back of his head and his buttocks. He denied loss of consciousness. Stated that since his discharge he has been feeling very weak. He continues to drink alcohol.  Patient's alcohol level was 156.  Covid and flu negative.  10/17- pt scored 13 on ciwa protocal, threatened to leave AMA- 10/18- IVC papers signed yesterday as pt was confused and wanted to leave. Score is 1 on CIWA protocal this am.  10/19-score this a.m. on CIWA protocol    Consultants:     Procedures:   Antimicrobials:   Vancomycin-discontinued   Subjective: Mentation is well today.  Interactive and calm.  No complaint  Objective: Vitals:   08/21/20 0540 08/21/20 0837 08/21/20 0930 08/21/20 1300  BP: (!) 152/89 (!) 153/97  (!) 153/93  Pulse: 71 70 70   Resp:  18    Temp:  98.7 F (37.1 C)  98 F (36.7 C)  TempSrc:  Oral  Oral  SpO2:  96%  97%  Weight:      Height:        Intake/Output Summary (Last 24 hours) at 08/21/2020 1557 Last data filed at 08/21/2020 1300 Gross per 24 hour  Intake 1665.14 ml  Output 1876 ml  Net -210.86 ml   Filed Weights   08/18/20 1720  Weight: 54.4 kg    Examination:  General exam: Appears calm and comfortable    Respiratory system: Clear to auscultation. Respiratory effort normal. Cardiovascular system: S1 & S2 heard, RRR. No JVD, murmurs, rubs, gallops or clicks. No pedal edema. Gastrointestinal system: Abdomen is nondistended, soft and nontender.. Normal bowel sounds heard. Central nervous system: Alert and oriented.  Grossly intact no asterixis Extremities: No edema Psychiatry: . Mood & affect appropriate.     Data Reviewed: I have personally reviewed following labs and imaging studies  CBC: Recent Labs  Lab 08/18/20 1840 08/19/20 0542 08/20/20 0505  WBC 17.1* 11.6* 5.4  NEUTROABS 7.9*  --   --   HGB 16.0 13.6 12.4*  HCT 46.9 39.2 36.5*  MCV 87.3 87.1 89.5  PLT 370 329 915   Basic Metabolic Panel: Recent Labs  Lab 08/18/20 1802 08/18/20 2215 08/19/20 0542 08/20/20 0505  NA 135  --  138  --   K 4.5  --  4.0  --   CL 97*  --  102  --   CO2 23  --  27  --   GLUCOSE 96  --  108*  --   BUN <5*  --  8  --   CREATININE 0.54*  --  0.68 0.56*  CALCIUM 8.8*  --  8.6*  --   MG  --  1.9  --   --   PHOS  --  2.7  --   --    GFR: Estimated  Creatinine Clearance: 67.1 mL/min (A) (by C-G formula based on SCr of 0.56 mg/dL (L)). Liver Function Tests: Recent Labs  Lab 08/18/20 1802 08/19/20 0542  AST 32 27  ALT 29 25  ALKPHOS 104 94  BILITOT 0.8 1.2  PROT 8.2* 6.8  ALBUMIN 4.5 3.8   No results for input(s): LIPASE, AMYLASE in the last 168 hours. No results for input(s): AMMONIA in the last 168 hours. Coagulation Profile: Recent Labs  Lab 08/19/20 0542  INR 1.1   Cardiac Enzymes: Recent Labs  Lab 08/18/20 1802  CKTOTAL 157   BNP (last 3 results) No results for input(s): PROBNP in the last 8760 hours. HbA1C: No results for input(s): HGBA1C in the last 72 hours. CBG: No results for input(s): GLUCAP in the last 168 hours. Lipid Profile: No results for input(s): CHOL, HDL, LDLCALC, TRIG, CHOLHDL, LDLDIRECT in the last 72 hours. Thyroid Function Tests: No results  for input(s): TSH, T4TOTAL, FREET4, T3FREE, THYROIDAB in the last 72 hours. Anemia Panel: No results for input(s): VITAMINB12, FOLATE, FERRITIN, TIBC, IRON, RETICCTPCT in the last 72 hours. Sepsis Labs: Recent Labs  Lab 08/18/20 1750 08/18/20 1802 08/19/20 0542 08/19/20 1526 08/19/20 1739  PROCALCITON  --   --  <0.10  --   --   LATICACIDVEN 5.5* 4.9*  --  1.8 1.3    Recent Results (from the past 240 hour(s))  Respiratory Panel by RT PCR (Flu A&B, Covid) - Nasopharyngeal Swab     Status: None   Collection Time: 08/18/20  5:52 PM   Specimen: Nasopharyngeal Swab  Result Value Ref Range Status   SARS Coronavirus 2 by RT PCR NEGATIVE NEGATIVE Final    Comment: (NOTE) SARS-CoV-2 target nucleic acids are NOT DETECTED.  The SARS-CoV-2 RNA is generally detectable in upper respiratoy specimens during the acute phase of infection. The lowest concentration of SARS-CoV-2 viral copies this assay can detect is 131 copies/mL. A negative result does not preclude SARS-Cov-2 infection and should not be used as the sole basis for treatment or other patient management decisions. A negative result may occur with  improper specimen collection/handling, submission of specimen other than nasopharyngeal swab, presence of viral mutation(s) within the areas targeted by this assay, and inadequate number of viral copies (<131 copies/mL). A negative result must be combined with clinical observations, patient history, and epidemiological information. The expected result is Negative.  Fact Sheet for Patients:  PinkCheek.be  Fact Sheet for Healthcare Providers:  GravelBags.it  This test is no t yet approved or cleared by the [place] FDA and  has been authorized for detection and/or diagnosis of SARS-CoV-2 by FDA under an Emergency Use Authorization (EUA). This EUA will remain  in effect (meaning this test can be used) for the duration of  the COVID-19 declaration under Section 564(b)(1) of the Act, 21 U.S.C. section 360bbb-3(b)(1), unless the authorization is terminated or revoked sooner.     Influenza A by PCR NEGATIVE NEGATIVE Final   Influenza B by PCR NEGATIVE NEGATIVE Final    Comment: (NOTE) The Xpert Xpress SARS-CoV-2/FLU/RSV assay is intended as an aid in  the diagnosis of influenza from Nasopharyngeal swab specimens and  should not be used as a sole basis for treatment. Nasal washings and  aspirates are unacceptable for Xpert Xpress SARS-CoV-2/FLU/RSV  testing.  Fact Sheet for Patients: PinkCheek.be  Fact Sheet for Healthcare Providers: GravelBags.it  This test is not yet approved or cleared by the [place] FDA and  has been authorized for detection and/or  diagnosis of SARS-CoV-2 by  FDA under an Emergency Use Authorization (EUA). This EUA will remain  in effect (meaning this test can be used) for the duration of the  Covid-19 declaration under Section 564(b)(1) of the Act, 21  U.S.C. section 360bbb-3(b)(1), unless the authorization is  terminated or revoked. Performed at [facility], [address] [place], [place] [address]   Culture, blood (routine x 2)     Status: Abnormal   Collection Time: 08/18/20  8:34 PM   Specimen: BLOOD  Result Value Ref Range Status   Specimen Description   Final    BLOOD LEFT ANTECUBITAL Performed at [facility], [address], [place], [place] [address]    Special Requests   Final    BOTTLES DRAWN AEROBIC AND ANAEROBIC Blood Culture adequate volume Performed at [facility], [address] [place], [place] [address]    Culture  Setup Time   Final    Organism ID to follow [place] CRITICAL RESULT CALLED TO, READ BACK BY AND VERIFIED WITH: [facility] DUNCAN AT [place] 08/19/20.PMF Performed at [facility], [address]  [place], [place] [address]    Culture (A)  Final    STAPHYLOCOCCUS EPIDERMIDIS THE SIGNIFICANCE OF ISOLATING THIS ORGANISM FROM A SINGLE SET OF BLOOD CULTURES WHEN MULTIPLE SETS ARE DRAWN IS UNCERTAIN. PLEASE NOTIFY THE MICROBIOLOGY DEPARTMENT WITHIN ONE WEEK IF SPECIATION AND SENSITIVITIES ARE REQUIRED. Performed at [facility], [address] [place], [place] [address]    Report Status 08/21/2020 FINAL  Final  Blood Culture ID Panel (Reflexed)     Status: Abnormal   Collection Time: 08/18/20  8:34 PM  Result Value Ref Range Status   Enterococcus faecalis NOT DETECTED NOT DETECTED Final   Enterococcus Faecium NOT DETECTED NOT DETECTED Final   Listeria monocytogenes NOT DETECTED NOT DETECTED Final   Staphylococcus species DETECTED (A) NOT DETECTED Final    Comment: CRITICAL RESULT CALLED TO, READ BACK BY AND VERIFIED WITH: [facility] DUNCAN AT 1826 08/19/20.PMF    Staphylococcus aureus (BCID) NOT DETECTED NOT DETECTED Final   Staphylococcus epidermidis DETECTED (A) NOT DETECTED Final    Comment: Methicillin (oxacillin) resistant coagulase negative staphylococcus. Possible blood culture contaminant (unless isolated from more than one blood culture draw or clinical case suggests pathogenicity). No antibiotic treatment is indicated for blood  culture contaminants. CRITICAL RESULT CALLED TO, READ BACK BY AND VERIFIED WITH: [facility] DUNCAN AT [place] 08/19/20.PMF    Staphylococcus lugdunensis NOT DETECTED NOT DETECTED Final   Streptococcus species NOT DETECTED NOT DETECTED Final   Streptococcus agalactiae NOT DETECTED NOT DETECTED Final   Streptococcus pneumoniae NOT DETECTED NOT DETECTED Final   Streptococcus pyogenes NOT DETECTED NOT DETECTED Final   A.calcoaceticus-baumannii NOT DETECTED NOT DETECTED Final   Bacteroides fragilis NOT DETECTED NOT DETECTED Final   Enterobacterales NOT DETECTED NOT DETECTED Final   Enterobacter cloacae complex NOT DETECTED NOT DETECTED Final   Escherichia coli  NOT DETECTED NOT DETECTED Final   Klebsiella aerogenes NOT DETECTED NOT DETECTED Final   Klebsiella oxytoca NOT DETECTED NOT DETECTED Final   Klebsiella pneumoniae NOT DETECTED NOT DETECTED Final   Proteus species NOT DETECTED NOT DETECTED Final   Salmonella species NOT DETECTED NOT DETECTED Final   Serratia marcescens NOT DETECTED NOT DETECTED Final   Haemophilus influenzae NOT DETECTED NOT DETECTED Final   Neisseria meningitidis NOT DETECTED NOT DETECTED Final   Pseudomonas aeruginosa NOT DETECTED NOT DETECTED Final   Stenotrophomonas  maltophilia NOT DETECTED NOT DETECTED Final   Candida albicans NOT DETECTED NOT DETECTED Final   Candida auris NOT DETECTED NOT DETECTED Final   Candida glabrata NOT DETECTED NOT DETECTED Final   Candida krusei NOT DETECTED NOT DETECTED Final   Candida parapsilosis NOT DETECTED NOT DETECTED Final   Candida tropicalis NOT DETECTED NOT DETECTED Final   Cryptococcus neoformans/gattii NOT DETECTED NOT DETECTED Final   Methicillin resistance mecA/C DETECTED (A) NOT DETECTED Final    Comment: CRITICAL RESULT CALLED TO, READ BACK BY AND VERIFIED WITH: [facility] DUNCAN AT 1826 08/19/20.PMF Performed at [facility], [address] [place], [place] [address]   Culture, blood (routine x 2)     Status: None (Preliminary result)   Collection Time: 08/18/20  8:39 PM   Specimen: BLOOD  Result Value Ref Range Status   Specimen Description BLOOD BLOOD RIGHT FOREARM  Final   Special Requests   Final    BOTTLES DRAWN AEROBIC AND ANAEROBIC Blood Culture results may not be optimal due to an inadequate volume of blood received in culture bottles   Culture   Final    NO GROWTH 3 DAYS Performed at [facility], [address], [place], [place] [address]    Report Status PENDING  Incomplete         Radiology Studies: No results found.      Scheduled Meds:  atenolol  25 mg Oral Daily   enoxaparin (LOVENOX) injection  40 mg Subcutaneous  Q24H   fluconazole  150 mg Oral Daily   folic acid  1 mg Oral Daily   influenza vaccine adjuvanted  0.5 mL Intramuscular Tomorrow-1000   LORazepam  0-4 mg Intravenous Q12H   Or   LORazepam  0-4 mg Oral Q12H   multivitamin with minerals  1 tablet Oral Daily   nystatin ointment   Topical BID   polyethylene glycol  17 g Oral Daily   thiamine  100 mg Oral Daily   Or   thiamine  100 mg Intravenous Daily   Continuous Infusions:  sodium chloride 75 mL/hr at 08/21/20 3013    Assessment & Plan:   Principal Problem:   Fall Active Problems:   Alcoholic intoxication with complication (HCC)   Paroxysmal A-fib (HCC)   Alcohol use disorder, severe, dependence (HCC)   SIRS (systemic inflammatory response syndrome) (HCC)   Poor social situation   Lactic acidosis   Cellulitis   69 year old male with history ofhypertension, GERD, depression,PAFnot on anticoagulation,alcohol use disorder, chronic right-sided weakness, recently hospitalized from 10/3-10/12with a diagnosis of SIRS after sepsis was ruled out, presenting with generalized weakness resulting in a fall. No injury on work-up   s/pFall Generalized weakness, with chronic right-sided weakness Poor social situation with inability to care for self, in part related to alcohol abuse -With persistent weakness.  Recently discharged from hospital.  Has also been at SNF not too long ago.   Lives alone.   -Continues to drink  10/19- PT/OT ordered, as pt needs placement Has been counseled multiple times about alcohol cessation, I will guess this will continue  Fall precautions     SIRS (systemic inflammatory response syndrome) (HCC) Possible sepsis versus lactic acidosispossibly related to alcohol use Cellulitisversus superficialskin inflammation perineal region Perineal Rash -2/2 moisture -Patient with lactic acidosis and leukocytosis with no obvious septic source and has an incidentalperineal erythema SIRS,  sepsis ruled out. Perineal area likely moisture related to yeast infection. Doubt cellulitis, will d/c vancomycin 10/19-continue fluconazole to complete 3  days nystatin ointment 1 culture with staph epi vancomycin was discontinued as it likely is a contaminant Other culture pending   Alcoholic intoxication with complication (HCC) Alcohol use disorder, severe, dependence (HCC) On CIWA protocol 10/19-still scoring, will continue on the protocol  Continue MVI, folate, thiamine     Paroxysmal A-fib (HCC) Continue atenolol, if need to can increase dose  No anticoagulation due to alcohol abuse, falls, chronic debility, poor social situation       DVT prophylaxis: Lovenox Code Status: Full Family Communication: None at bedside  Status is: Inpatient  Remains inpatient appropriate because:Unsafe d/c plan    Dispo: The patient is from: Home              Anticipated d/c is to: SNF              Anticipated d/c date is: 2 days              Patient currently is not medically stable to d/c.Pt currently scoring on CIWA protocal . Needs placement. Was IVC'd b/c wanted to leave ama while confused.            LOS: 3 days   Time spent: 35 min with >50% on coc    Nolberto Hanlon, MD [facility] Pager [phone]  If 7PM-7AM, please contact night-coverage [email or web address] Password [facility] 08/21/2020, 3:57 PM

## 2020-08-21 NOTE — Progress Notes (Signed)
Patient took battery out of telemetry box and threw it across the room.  Reoriented patient and medication given for agitation.  Sitter at bedside.

## 2020-08-21 NOTE — Progress Notes (Signed)
Sepsis screen performed and is negative. Will continue to monitor.

## 2020-08-22 ENCOUNTER — Inpatient Hospital Stay: Payer: Medicare HMO

## 2020-08-22 DIAGNOSIS — F1020 Alcohol dependence, uncomplicated: Secondary | ICD-10-CM | POA: Diagnosis not present

## 2020-08-22 NOTE — Plan of Care (Signed)
Pt remained calm overnight. Sitter remains at bedside. Pt attempts to get his leg over the rail at times but is easily redirectable. Pt remains on IVF, IV Abx and Nystatin cream to reddened peri area. Scattered body rash to abdomen and rectal area noted. Pt able to turn self in bed. Safety measures in place. Will continue to monitor.  Problem: Education: Goal: Knowledge of General Education information will improve Description: Including pain rating scale, medication(s)/side effects and non-pharmacologic comfort measures Outcome: Progressing   Problem: Health Behavior/Discharge Planning: Goal: Ability to manage health-related needs will improve Outcome: Progressing   Problem: Clinical Measurements: Goal: Ability to maintain clinical measurements within normal limits will improve Outcome: Progressing Goal: Will remain free from infection Outcome: Progressing Goal: Diagnostic test results will improve Outcome: Progressing Goal: Respiratory complications will improve Outcome: Progressing Goal: Cardiovascular complication will be avoided Outcome: Progressing   Problem: Activity: Goal: Risk for activity intolerance will decrease Outcome: Progressing   Problem: Nutrition: Goal: Adequate nutrition will be maintained Outcome: Progressing   Problem: Coping: Goal: Level of anxiety will decrease Outcome: Progressing   Problem: Elimination: Goal: Will not experience complications related to bowel motility Outcome: Progressing Goal: Will not experience complications related to urinary retention Outcome: Progressing   Problem: Pain Managment: Goal: General experience of comfort will improve Outcome: Progressing   Problem: Safety: Goal: Ability to remain free from injury will improve Outcome: Progressing   Problem: Skin Integrity: Goal: Risk for impaired skin integrity will decrease Outcome: Progressing

## 2020-08-22 NOTE — Progress Notes (Signed)
Mobility Specialist - Progress Note   08/22/20 1343  Mobility  Activity Ambulated in hall;Ambulated in room  Level of Assistance Contact guard assist, steadying assist  Assistive Device Cane  Distance Ambulated (ft) 260 ft  Mobility Response Tolerated well  Mobility performed by Mobility specialist  $Mobility charge 1 Mobility    Pre-mobility: 71 HR, 96% SpO2 During mobility: 80 HR, 98% SpO2 Post-mobility: 71 HR, 98% SpO2   Pt was sitting in recliner with sitter present upon arrival. Pt agreed to session. Pt was minA in STS transfer. Pt ambulated 40' in room with SPC, no LOB noted. However, unshaky/unsteady gait. Pt took a seated break, denying any pain, weakness, or tiredness. Pt motivated to ambulate in hallway. Pt ambulated an additional 220' in room and hallway. Still no LOB and no heavy breathing. Pt able to speak full sentences and carry on conversations during ambulation. Overall, pt tolerated session well. Pt was left in recliner with sitter present and all needs in reach. Pt motivated to attempt ambulation later this evening.    Kathee Delton Mobility Specialist 08/22/20, 1:52 PM
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
## 2020-08-22 NOTE — Evaluation (Signed)
Physical Therapy Evaluation Patient Details Name: Douglas Peterson. MRN: [id number] DOB: 1951-10-16 Today's Date: 08/22/2020   History of Present Illness  Brondon Wann. is a 02RKY who comes to [facility] on 10/16 after fall at home. Pt found by EMS in his car with blood on back of head, pt reportedly fell in gragae earlier in day, hitting head adn buttocks. PMH: GERD, depression PAF not on AC, ETOH abuse c frequent admissions, Chronic Rt spastic hemiplegia. Pt recently DC back to home on 10/12 after SIRS admission.  Clinical Impression  Pt admitted with above diagnosis. Pt currently with functional limitations due to the deficits listed below (see "PT Problem List"). Pt familiar to author from multiple prior admissions. Upon entry, pt in bed, awake and agreeable to participate. The pt is alert and oriented x4, pleasant, conversational, and generally a good historian. MinA for bed mobility, minGuard assist for transfers, minGuard assit for AMB in room around obstacles, no device sued this date.  Functional mobility assessment demonstrates increased effort/time requirements, poor tolerance, and need for physical assistance, whereas the patient performed these at a higher level of independence PTA.  Pt denies any pain from recent fall PTA or Rt clavicle fracture from 7 weeks prior. Pt will benefit from skilled PT intervention to increase independence and safety with basic mobility in preparation for discharge to the venue listed below.       Follow Up Recommendations Home health PT;Supervision for mobility/OOB    Equipment Recommendations  None recommended by PT    Recommendations for Other Services       Precautions / Restrictions Precautions Precautions: Fall Precaution Comments: 1week old, clavicular fracture, ortho rec pending Restrictions Other Position/Activity Restrictions: Rt clavicle fx on 06/05/20      Mobility  Bed Mobility Overal bed mobility: Needs Assistance Bed Mobility:  Supine to Sit     Supine to sit: Min assist          Transfers Overall transfer level: Needs assistance Equipment used: None Transfers: Sit to/from Stand Sit to Stand: Min guard;Min assist         General transfer comment: perfoms 10x from EOB, difficulty obtaining balance, DCing posterio lean toward end.  Ambulation/Gait Ambulation/Gait assistance: Min guard Gait Distance (Feet): 80 Feet Assistive device: None       General Gait Details: baseline gait with Rt spastic hemiplegia, shoes used, pt not using SPC per baseline, slightly more unsteady than baseline  Stairs            Wheelchair Mobility    Modified Rankin (Stroke Patients Only)       Balance                                             Pertinent Vitals/Pain Pain Assessment: No/denies pain    Home Living Family/patient expects to be discharged to:: Private residence Living Arrangements: Alone Available Help at Discharge: Family;Other (Comment) Type of Home: House Home Access: Stairs to enter Entrance Stairs-Rails: Right;Left;Can reach both Entrance Stairs-Number of Steps: 3 with no rails (from garage) or 5 with bilateral rails (from front entrance) Home Layout: One level Home Equipment: Cane - single point;Cane - quad;Walker - 2 wheels;Tub bench;Grab bars - tub/shower;Shower seat;Grab bars - toilet Additional Comments: RUE Resting hand splint - did use it in past - but not recently per pt    Prior Function Level  of Independence: Independent with assistive device(s)         Comments: Pt recently d/c from SNF. Ambulates c SPC; h/o falls     Hand Dominance   Dominant Hand: Left    Extremity/Trunk Assessment                Communication   Communication: No difficulties  Cognition Arousal/Alertness: Awake/alert Behavior During Therapy: WFL for tasks assessed/performed Overall Cognitive Status: Within Functional Limits for tasks assessed                                         General Comments      Exercises     Assessment/Plan    PT Assessment Patient needs continued PT services  PT Problem List Decreased strength;Decreased activity tolerance;Decreased balance;Decreased mobility       PT Treatment Interventions DME instruction;Gait training;Stair training;Functional mobility training;Therapeutic activities;Therapeutic exercise;Balance training;Patient/family education    PT Goals (Current goals can be found in the Care Plan section)  Acute Rehab PT Goals Patient Stated Goal: get back to home PT Goal Formulation: With patient Time For Goal Achievement: 09/05/20 Potential to Achieve Goals: Good    Frequency Min 2X/week   Barriers to discharge Decreased caregiver support;Inaccessible home environment      Co-evaluation               AM-PAC PT "6 Clicks" Mobility  Outcome Measure Help needed turning from your back to your side while in a flat bed without using bedrails?: A Little Help needed moving from lying on your back to sitting on the side of a flat bed without using bedrails?: A Little Help needed moving to and from a bed to a chair (including a wheelchair)?: A Little Help needed standing up from a chair using your arms (e.g., wheelchair or bedside chair)?: A Little Help needed to walk in hospital room?: A Little Help needed climbing 3-5 steps with a railing? : A Little 6 Click Score: 18    End of Session Equipment Utilized During Treatment: Gait belt Activity Tolerance: Patient tolerated treatment well;No increased pain Patient left: with call bell/phone within reach;in chair;with nursing/sitter in room Nurse Communication: Mobility status PT Visit Diagnosis: Unsteadiness on feet (R26.81);Repeated falls (R29.6);Muscle weakness (generalized) (M62.81);History of falling (Z91.81);Difficulty in walking, not elsewhere classified (R26.2)    Time: 5997-7414 PT Time Calculation (min) (ACUTE ONLY): 20  min   Charges:   PT Evaluation $PT Eval Low Complexity: 1 Low          4:43 PM, 08/22/20 Etta Grandchild, PT, DPT Physical Therapist - [facility]  [phone] ([place])    [place] 08/22/2020, 4:41 PM

## 2020-08-22 NOTE — Evaluation (Signed)
Occupational Therapy Evaluation Patient Details Name: Douglas Peterson. MRN: [id number] DOB: 20-Aug-1951 Today's Date: 08/22/2020    History of Present Illness Douglas Peterson. is a 22QMG who comes to [facility] on 10/16 after fall at home. Pt found by EMS in his car with blood on back of head, pt reportedly fell in garage earlier in day, hitting head and buttocks. 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 Pt recently DC back to home on 10/12 after SIRS admission.   Clinical Impression   Pt seen for OT evaluation this date in setting of acute hospitalization following fall. Pt is well known to this rehab service and hospital. Pt lives alone in [facility] and is MOD I with cane at baseline. On assessment this date, pt requriing MIN A for LB ADLs in sitting, MIN A/CGA for ADL transfers and standing UB grooming tasks. Pt demos decreased safety awareness, but is responsive to cues from OT. Anticipate pt will require SUPV and HHOT upon d/c for safety and carryover.     Follow Up Recommendations  Home health OT;Supervision - Intermittent    Equipment Recommendations  None recommended by OT    Recommendations for Other Services       Precautions / Restrictions Precautions Precautions: Fall Precaution Comments: 67week old, clavicular fracture, ortho rec pending Restrictions Weight Bearing Restrictions: No RUE Weight Bearing: Non weight bearing Other Position/Activity Restrictions: Rt clavicle fx on 06/05/20      Mobility Bed Mobility Overal bed mobility: Needs Assistance Bed Mobility: Supine to Sit     Supine to sit: Supervision          Transfers Overall transfer level: Needs assistance Equipment used: Straight cane Transfers: Sit to/from Stand Sit to Stand: Min guard;Min assist         General transfer comment: perfoms 10x from EOB, difficulty obtaining balance, DCing posterio lean toward end.    Balance  Overall balance assessment: Needs assistance;History of Falls Sitting-balance support: Feet supported;Single extremity supported Sitting balance-Leahy Scale: Good     Standing balance support: Single extremity supported;During functional activity Standing balance-Leahy Scale: Fair Standing balance comment: cannot tolerate challenge                           ADL either performed or assessed with clinical judgement   ADL                                         General ADL Comments: CGA standing UB ADLs at sink. MIN A for seated LB ADLs including donning socks/shoes.     Vision Patient Visual Report: No change from baseline       Perception     Praxis      Pertinent Vitals/Pain Pain Assessment: No/denies pain     Hand Dominance Left   Extremity/Trunk Assessment Upper Extremity Assessment Upper Extremity Assessment: RUE deficits/detail;LUE deficits/detail RUE Deficits / Details: chronic RUE spastic hemiplegia. Recent clavicular fx ~8wks ago LUE Deficits / Details: AROM grossly WFL   Lower Extremity Assessment Lower Extremity Assessment: Defer to PT evaluation;Generalized weakness RLE Deficits / Details: Chronic spastic hemiplegia       Communication Communication Communication: Other (comment) (somewhat raspy and some mumbles at baseline, but overall discernable.)   Cognition Arousal/Alertness: Awake/alert Behavior During Therapy: [facility] for tasks  assessed/performed Overall Cognitive Status: Within Functional Limits for tasks assessed                                 General Comments: pt is appropriate and oriented today. Pt appears to be in his normal state of mentation at time of OT assessment. Continues to have decreased safety awareness and lack of insight into deficits. Some impulsivity, but able to be re-directed. Follows all commands.   General Comments       Exercises Other Exercises Other Exercises: OT facilitates  safety education with pt including safely negotiating obstacles when performing fxl mobility. Pt with moderate carryover, will benefit from f/u.   Shoulder Instructions      Home Living Family/patient expects to be discharged to:: Private residence Living Arrangements: Alone Available Help at Discharge: Family (sister was checking on pt intermittently prior, but has apparently not in some time.) Type of Home: House Home Access: Stairs to enter [employer] of Steps: 3 with no rails (from garage) or 5 with bilateral rails (from front entrance) Entrance Stairs-Rails: Right;Left;Can reach both Home Layout: One level     Bathroom Shower/Tub: Teacher, early years/pre: Standard     Home Equipment: Cane - single point;Cane - quad;Walker - 2 wheels;Tub bench;Grab bars - tub/shower;Shower seat;Grab bars - toilet   Additional Comments: RUE Resting hand splint - did use it in past - but not recently per pt      Prior Functioning/Environment Level of Independence: Independent with assistive device(s)        Comments: Pt recently d/c from SNF. Ambulates c SPC; h/o falls        OT Problem List: Decreased strength;Decreased range of motion;Decreased activity tolerance;Impaired balance (sitting and/or standing);Decreased safety awareness      OT Treatment/Interventions: Self-care/ADL training;Therapeutic exercise;Energy conservation;DME and/or AE instruction;Therapeutic activities;Patient/family education;Balance training    OT Goals(Current goals can be found in the care plan section) Acute Rehab OT Goals Patient Stated Goal: get back to home OT Goal Formulation: With patient Time For Goal Achievement: 09/05/20 Potential to Achieve Goals: Fair ADL Goals Pt Will Perform Lower Body Dressing: with modified independence;sit to/from stand Pt Will Transfer to Toilet: with modified independence;ambulating Pt Will Perform Toileting - Clothing Manipulation and hygiene: with  modified independence;sit to/from stand  OT Frequency: Min 1X/week   Barriers to D/C: Decreased caregiver support          Co-evaluation              AM-PAC OT "6 Clicks" Daily Activity     Outcome Measure Help from another person eating meals?: None Help from another person taking care of personal grooming?: A Little Help from another person toileting, which includes using toliet, bedpan, or urinal?: A Little Help from another person bathing (including washing, rinsing, drying)?: A Little Help from another person to put on and taking off regular upper body clothing?: A Little Help from another person to put on and taking off regular lower body clothing?: A Little 6 Click Score: 19   End of Session Nurse Communication: Mobility status  Activity Tolerance: Patient tolerated treatment well Patient left: in chair;with call bell/phone within reach;with nursing/sitter in room (sitter present)  OT Visit Diagnosis: Other abnormalities of gait and mobility (R26.89);Muscle weakness (generalized) (M62.81);History of falling (Z91.81);Repeated falls (R29.6)                Time: 3664-4034 OT Time  Calculation (min): 39 min Charges:  OT General Charges $OT Visit: 1 Visit OT Evaluation $OT Eval Moderate Complexity: 1 Mod OT Treatments $Self Care/Home Management : 8-22 mins $Therapeutic Activity: 8-22 mins  Gerrianne Scale, MS, OTR/L ascom [phone] 08/22/20, 4:52 PM

## 2020-08-22 NOTE — Progress Notes (Signed)
Sepsis screen performed and is negative. Will continue to monitor.

## 2020-08-22 NOTE — Progress Notes (Signed)
[place] [facility] [place] NAME: Douglas Peterson    MR#:  [id number]  DATE OF BIRTH:  02-12-1951  SUBJECTIVE:   Patient laying in bed comfortably. He is oriented to place. Tolerating PO diet. Sitter in the room.  Patient was placed under IVC since he threatened to leave. He has been having falls at home and brought in with alcohol intoxication.  Most recent serial score is 2 REVIEW OF SYSTEMS:   Review of Systems  Unable to perform ROS: Medical condition   Tolerating Diet: Tolerating PT:   DRUG ALLERGIES:   Allergies  Allergen Reactions  . Hydrochlorothiazide Other (See Comments)    Hyponatremia    VITALS:  Blood pressure (!) 150/89, pulse 71, temperature 98.2 F (36.8 C), temperature source Oral, resp. rate (!) 22, height 5' 4"  (1.626 m), weight 54.4 kg, SpO2 97 %.  PHYSICAL EXAMINATION:   Physical Exam  GENERAL:  69 y.o.-year-old patient lying in the bed with no acute distress. Frail  LUNGS: Normal breath sounds bilaterally, no wheezing, rales, rhonchi. No use of accessory muscles of respiration.  CARDIOVASCULAR: S1, S2 normal. No murmurs, rubs, or gallops.  ABDOMEN: Soft, nontender, nondistended. Bowel sounds present. No organomegaly or mass.  EXTREMITIES: No cyanosis, clubbing or edema b/l.    NEUROLOGIC: Cranial nerves II through XII are intact. Patient has chronic right upper extremity spasticity secondary to head injury in the past  PSYCHIATRIC:  patient is alert and awake. Some baseline confusion SKIN:  Pressure Injury 06/05/20 Coccyx Posterior;Medial Stage 1 -  Intact skin with non-blanchable redness of a localized area usually over a bony prominence. red intact dry skin (Active)  06/05/20 1902  Location: Coccyx  Location Orientation: Posterior;Medial  Staging: Stage 1 -  Intact skin with non-blanchable redness of a localized area usually over a bony prominence.  Wound Description (Comments): red intact dry skin  Present  on Admission: Yes   LABORATORY PANEL:  CBC Recent Labs  Lab 08/20/20 0505  WBC 5.4  HGB 12.4*  HCT 36.5*  PLT 226    Chemistries  Recent Labs  Lab 08/18/20 1802 08/18/20 2215 08/19/20 0542 08/19/20 0542 08/20/20 0505  NA   < >  --  138  --   --   K   < >  --  4.0  --   --   CL   < >  --  102  --   --   CO2   < >  --  27  --   --   GLUCOSE   < >  --  108*  --   --   BUN   < >  --  8  --   --   CREATININE   < >  --  0.68   < > 0.56*  CALCIUM   < >  --  8.6*  --   --   MG  --  1.9  --   --   --   AST   < >  --  27  --   --   ALT   < >  --  25  --   --   ALKPHOS   < >  --  94  --   --   BILITOT   < >  --  1.2  --   --    < > = values in this interval not displayed.   Cardiac Enzymes No results for  input(s): TROPONINI in the last 168 hours. RADIOLOGY:  No results found. ASSESSMENT AND PLAN:  69 year old male with history ofhypertension, GERD, depression,PAFnot on anticoagulation,alcohol use disorder, chronic right-sided weakness, recently hospitalized from 10/3-10/12with a diagnosis of SIRS after sepsis was ruled out, presenting with generalized weakness resulting in a fall. No injury on work-up   s/pFall Generalized weakness, with chronic right-sided weakness Poor social situation with inability to care for self, in part related to alcohol abuse -With persistent weakness. Recently discharged from hospital. Has also been at SNF not too long ago.  -Lives alone, patient has been noncompliant with recommendations given in the hospital. He leaves rehab places prematurely. -Continues to drink  alcohol on a daily basis 10/19- PT/OT ordered, as pt needs placement -Has been counseled multiple times about alcohol cessation-- without any change made by patient -continue fall precautions   SIRS (systemic inflammatory response syndrome) (HCC) Possible sepsis versus lactic acidosispossibly related to alcohol use Cellulitisversus superficialskin inflammation  perineal region Perineal Rash -2/2 moisture -Patient with lactic acidosis and leukocytosis with no obvious septic source and has an incidentalperineal erythema -SIRS, sepsis ruled out. -Perineal area likely moisture related to yeast infection. Doubt cellulitis, will d/c vancomycin -10/19-continue fluconazole to complete 3 days nystatin ointment -1 culture with staph epi vancomycin was discontinued as it likely is a contaminant  Alcoholic intoxication with complication (HCC) Alcohol use disorder, severe, dependence ([place]) -On CIWA protocol -10/19-still scoring, will continue on the protocol  -Continue MVI, folate, thiamine  -10/20-- scoring low today, eating better. At times gets restless and tries to climb out of the bed.  Paroxysmal A-fib (HCC) Continue atenolol, if need to can increase dose  No anticoagulation due to alcohol abuse, falls, chronic debility, poor social situation    DVT prophylaxis: Lovenox Code Status: Full Family Communication: None at bedside  Status is: Inpatient  Remains inpatient appropriate because:Unsafe d/c plan    Dispo: The patient is from: Home  Anticipated d/c is to: SNF  Anticipated d/c date is: 2 days  Patient currently is not medically stable to d/c.Pt currently scoring on CIWA protocol .  TOC for discharge planning. Patient's discharge planning has been challenging.  He is at a very high risk for readmission due to his ongoing alcohol abuse.   TOTAL TIME TAKING CARE OF THIS PATIENT: *25* minutes.  >50% time spent on counselling and coordination of care  Note: This dictation was prepared with Dragon dictation along with smaller phrase technology. Any transcriptional errors that result from this process are unintentional.  Fritzi Mandes M.D    [facility]   CC: Primary care physician; [name], MDPatient ID: Douglas Pesa., male   DOB: 1951/05/01, 69 y.o.   MRN: [id number]

## 2020-08-23 DIAGNOSIS — F1020 Alcohol dependence, uncomplicated: Secondary | ICD-10-CM | POA: Diagnosis not present

## 2020-08-23 LAB — CULTURE, BLOOD (ROUTINE X 2): Culture: NO GROWTH

## 2020-08-23 MED ORDER — ACETAMINOPHEN 500 MG PO TABS: 500.0000 mg | ORAL_TABLET | Freq: Three times a day (TID) | ORAL | 0 refills | Status: DC | PRN

## 2020-08-23 MED ORDER — THIAMINE HCL 100 MG PO TABS
100.0000 mg | ORAL_TABLET | Freq: Every day | ORAL | 0 refills | Status: DC
Start: 2020-08-24 — End: 2020-11-03

## 2020-08-23 NOTE — Plan of Care (Signed)
Pt remained calm overnight. Sitter remains at bedside. Pt attempts to get his leg over the rail at times but is easily redirectable. Nystatin cream to reddened peri area. Scattered body rash to abdomen and rectal area noted. Tylenol administered for right arm pain, effective. Pt able to turn self in bed. Safety measures in place. Will continue to monitor.  Problem: Education: Goal: Knowledge of General Education information will improve Description: Including pain rating scale, medication(s)/side effects and non-pharmacologic comfort measures Outcome: Progressing   Problem: Health Behavior/Discharge Planning: Goal: Ability to manage health-related needs will improve Outcome: Progressing   Problem: Clinical Measurements: Goal: Ability to maintain clinical measurements within normal limits will improve Outcome: Progressing Goal: Will remain free from infection Outcome: Progressing Goal: Diagnostic test results will improve Outcome: Progressing Goal: Respiratory complications will improve Outcome: Progressing Goal: Cardiovascular complication will be avoided Outcome: Progressing   Problem: Activity: Goal: Risk for activity intolerance will decrease Outcome: Progressing   Problem: Nutrition: Goal: Adequate nutrition will be maintained Outcome: Progressing   Problem: Coping: Goal: Level of anxiety will decrease Outcome: Progressing   Problem: Elimination: Goal: Will not experience complications related to bowel motility Outcome: Progressing Goal: Will not experience complications related to urinary retention Outcome: Progressing   Problem: Pain Managment: Goal: General experience of comfort will improve Outcome: Progressing   Problem: Safety: Goal: Ability to remain free from injury will improve Outcome: Progressing   Problem: Skin Integrity: Goal: Risk for impaired skin integrity will decrease Outcome: Progressing

## 2020-08-23 NOTE — Progress Notes (Addendum)
Physical Therapy Treatment Patient Details Name: Douglas Peterson. MRN: [id number] DOB: December 31, 1950 Today's Date: 08/23/2020    History of Present Illness Chales Pelissier. is a 85UDJ who comes to [facility] on 10/16 after fall at home. Pt found by EMS in his car with blood on back of head, pt reportedly fell in garage earlier in day, hitting head and buttocks. 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 Pt recently DC back to home on 10/12 after SIRS admission.    PT Comments    Pt in bed upon entry, agreeable to session. Per conversation with Dr Fritzi Mandes and Dr. Thornton Park, imaging done this date (Rt clavicular fracture) no sling needed anymore, and WBAT RUE. ModI bed mobility, modI shoe donning, Supervision STS transfer c SPC, no LOB, then AMB 426f c SPC at minGuard to Supervision level. Pt looses gait speed by >25% from beginning to end of walk. Pt has 1 significant LOB without ability to recover except for maxA from [employer] Pt reports to feel much better. Pt encouraged to pace self when progressing back to baseline community mobility, IADL performance.     Follow Up Recommendations  Home health PT;Supervision for mobility/OOB     Equipment Recommendations  None recommended by PT    Recommendations for Other Services       Precautions / Restrictions Precautions Precautions: Fall Required Braces or Orthoses:  (no sling needed as of 10/21 per Dr. KMack Guise Restrictions RUE Weight Bearing: Weight bearing as tolerated Other Position/Activity Restrictions: Rt clavicle fx on 06/05/20    Mobility  Bed Mobility Overal bed mobility: Modified Independent                Transfers Overall transfer level: Modified independent   Transfers: Sit to/from Stand              Ambulation/Gait   Gait Distance (Feet): 400 Feet Assistive device: Straight cane Gait Pattern/deviations: Step-to pattern Gait  velocity: 0.357m @ start, and 0.2719mat end   General Gait Details: 3-point gait c LUE SPC; 1 significant LOB middle of walk, unable to recover balance despite given time to attempt, ultimately requires maxBarrister's clerk Modified Rankin (Stroke Patients Only)       Balance                                            Cognition Arousal/Alertness: Awake/alert Behavior During Therapy: WFL for tasks assessed/performed Overall Cognitive Status: Within Functional Limits for tasks assessed                                        Exercises      General Comments        Pertinent Vitals/Pain Pain Assessment: No/denies pain    Home Living                      Prior Function            PT Goals (current goals can now be found in the care plan section) Acute Rehab PT Goals Patient Stated Goal: get back to home  PT Goal Formulation: With patient Time For Goal Achievement: 09/05/20 Potential to Achieve Goals: Good Progress towards PT goals: Progressing toward goals    Frequency    Min 2X/week      PT Plan Current plan remains appropriate    Co-evaluation              AM-PAC PT "6 Clicks" Mobility   Outcome Measure  Help needed turning from your back to your side while in a flat bed without using bedrails?: A Little Help needed moving from lying on your back to sitting on the side of a flat bed without using bedrails?: A Little Help needed moving to and from a bed to a chair (including a wheelchair)?: A Little Help needed standing up from a chair using your arms (e.g., wheelchair or bedside chair)?: A Little Help needed to walk in hospital room?: A Little Help needed climbing 3-5 steps with a railing? : A Little 6 Click Score: 18    End of Session Equipment Utilized During Treatment: Gait belt Activity Tolerance: Patient tolerated treatment well;Patient limited by  fatigue Patient left: with call bell/phone within reach;in chair Nurse Communication: Mobility status PT Visit Diagnosis: Unsteadiness on feet (R26.81);Repeated falls (R29.6);Muscle weakness (generalized) (M62.81);History of falling (Z91.81);Difficulty in walking, not elsewhere classified (R26.2)     Time: 7782-4235 PT Time Calculation (min) (ACUTE ONLY): 18 min  Charges:  $Gait Training: 8-22 mins                     12:40 PM, 08/23/20 Etta Grandchild, PT, DPT Physical Therapist - [facility]  [phone] ([place])    , C 08/23/2020, 12:37 PM

## 2020-08-23 NOTE — TOC Transition Note (Signed)
Transition of Care [facility]) - CM/SW Discharge Note   Patient Details  Name: Douglas Peterson. MRN: [id number] Date of Birth: [birth year]  Transition of Care [facility]) CM/SW Contact:  Beverly Sessions, RN Phone Number: 08/23/2020, 11:54 AM   Clinical Narrative:    Patient discharging today  [place] with [facility] notified.  They will accept patient back Daughter wendy notified of discharge Roni from [place] notified of discharge  Per patient and daughter, patient's sister will pick up at discharge   Final next level of care: [place] Barriers to Discharge: No Barriers Identified   Patient Goals and CMS Choice        Discharge Placement                       Discharge Plan and Services                DME Arranged: N/A DME Agency: NA       HH Arranged: PT, OT, Nurse's Aide [place] Agency: [facility] Date [facility] Agency Contacted: 08/23/20   Representative spoke with at [place]: [place] ([place]) Interventions     Readmission Risk Interventions Readmission Risk Prevention Plan 08/23/2020 08/19/2020 08/14/2020  Transportation Screening Complete Complete Complete  PCP or Specialist Appt within 3-5 Days - - -  HRI or [place] Screening - - -  Medication Review (RN Care Manager) Complete Complete -  PCP or Specialist appointment within 3-5 days of discharge (No Data) Complete Complete  HRI or Home Care Consult Complete - Complete  SW Recovery Care/Counseling Consult - Complete Complete  Palliative Care Screening - Not Applicable Not [place] Not Applicable - Patient Refused  Some recent data might be hidden

## 2020-08-23 NOTE — Progress Notes (Signed)
Mobility Specialist - Progress Note   08/23/20 1239  Mobility  Activity Refused mobility  Mobility performed by Mobility specialist    Pt eating lunch on arrival and declined mobility d/t soon d/c and "not wanting to keep sister waiting." Pt voiced no other needs at this time.    Kathee Delton Mobility Specialist 08/23/20, 12:41 PM
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
## 2020-08-23 NOTE — TOC Progression Note (Signed)
Transition of Care (TOC) - Progression Note    Patient Details  Name: Douglas Peterson. MRN: [id number] Date of Birth: 1951/09/05  [facility]) CM/SW Contact  Beverly Sessions, RN Phone Number: 08/23/2020, 9:27 AM  Clinical Narrative:    IVC has been discontinued by psych.   Patient open with [facility] for RN, PT, and OT.  Per [place] with [facility] they can accept patient back at discharge.  Will need resumption of home health orders      Expected Discharge Plan: Home/Self Care (Pending) Barriers to Discharge: Continued Medical Work up  Expected Discharge Plan and Services Expected Discharge Plan: Home/Self Care (Pending)       Living arrangements for the past 2 months: Single Family Home                 DME Arranged: N/A DME Agency: NA                   Social Determinants of Health (SDOH) Interventions    Readmission Risk Interventions Readmission Risk Prevention Plan 08/19/2020 08/14/2020 05/19/2020  Transportation Screening Complete Complete Complete  PCP or Specialist Appt within 3-5 Days - - -  HRI or [place] - - -  Palliative Care Screening - - -  Medication Review (RN Care Manager) Complete - Complete  PCP or Specialist appointment within 3-5 days of discharge Complete Complete Complete  HRI or [place] - Complete Complete  SW Recovery Care/Counseling Consult Complete Complete Complete  Palliative Care Screening Not Applicable Not Applicable Not [place] - Patient Refused Not Applicable  Some recent data might be hidden

## 2020-08-23 NOTE — Progress Notes (Signed)
Order received from Dr Posey Pronto to discontinue the sitter

## 2020-09-04 ENCOUNTER — Emergency Department
Admission: EM | Admit: 2020-09-04 | Discharge: 2020-09-13 | Disposition: A | Discharge status: Home / Self Care | Payer: Medicare HMO | Attending: Emergency Medicine | Admitting: Emergency Medicine

## 2020-09-04 ENCOUNTER — Encounter: Payer: Self-pay | Admitting: Emergency Medicine

## 2020-09-04 ENCOUNTER — Emergency Department: Payer: Medicare HMO

## 2020-09-04 ENCOUNTER — Other Ambulatory Visit: Payer: Self-pay

## 2020-09-04 DIAGNOSIS — Z9181 History of falling: Secondary | ICD-10-CM | POA: Diagnosis not present

## 2020-09-04 DIAGNOSIS — F10920 Alcohol use, unspecified with intoxication, uncomplicated: Secondary | ICD-10-CM

## 2020-09-04 DIAGNOSIS — R296 Repeated falls: Secondary | ICD-10-CM | POA: Diagnosis not present

## 2020-09-04 DIAGNOSIS — M6281 Muscle weakness (generalized): Secondary | ICD-10-CM | POA: Diagnosis not present

## 2020-09-04 DIAGNOSIS — F1020 Alcohol dependence, uncomplicated: Secondary | ICD-10-CM | POA: Diagnosis present

## 2020-09-04 DIAGNOSIS — W19XXXA Unspecified fall, initial encounter: Secondary | ICD-10-CM | POA: Diagnosis present

## 2020-09-04 DIAGNOSIS — R2681 Unsteadiness on feet: Secondary | ICD-10-CM | POA: Diagnosis not present

## 2020-09-04 DIAGNOSIS — I10 Essential (primary) hypertension: Secondary | ICD-10-CM | POA: Insufficient documentation

## 2020-09-04 DIAGNOSIS — S0990XA Unspecified injury of head, initial encounter: Secondary | ICD-10-CM | POA: Diagnosis not present

## 2020-09-04 DIAGNOSIS — Z79899 Other long term (current) drug therapy: Secondary | ICD-10-CM | POA: Diagnosis not present

## 2020-09-04 DIAGNOSIS — W01198A Fall on same level from slipping, tripping and stumbling with subsequent striking against other object, initial encounter: Secondary | ICD-10-CM | POA: Insufficient documentation

## 2020-09-04 DIAGNOSIS — Z87891 Personal history of nicotine dependence: Secondary | ICD-10-CM | POA: Diagnosis not present

## 2020-09-04 DIAGNOSIS — Z7982 Long term (current) use of aspirin: Secondary | ICD-10-CM | POA: Insufficient documentation

## 2020-09-04 DIAGNOSIS — Y92009 Unspecified place in unspecified non-institutional (private) residence as the place of occurrence of the external cause: Secondary | ICD-10-CM | POA: Diagnosis present

## 2020-09-04 DIAGNOSIS — R262 Difficulty in walking, not elsewhere classified: Secondary | ICD-10-CM | POA: Insufficient documentation

## 2020-09-04 DIAGNOSIS — F10929 Alcohol use, unspecified with intoxication, unspecified: Secondary | ICD-10-CM | POA: Diagnosis present

## 2020-09-04 DIAGNOSIS — Z20822 Contact with and (suspected) exposure to covid-19: Secondary | ICD-10-CM | POA: Insufficient documentation

## 2020-09-04 LAB — CBC WITH DIFFERENTIAL/PLATELET
Abs Immature Granulocytes: 0.02 10*3/uL (ref 0.00–0.07)
Basophils Absolute: 0.1 10*3/uL (ref 0.0–0.1)
Basophils Relative: 1 %
Eosinophils Absolute: 0 10*3/uL (ref 0.0–0.5)
Eosinophils Relative: 0 %
HCT: 43.6 % (ref 39.0–52.0)
Hemoglobin: 14.8 g/dL (ref 13.0–17.0)
Immature Granulocytes: 0 %
Lymphocytes Relative: 60 %
Lymphs Abs: 7.2 10*3/uL — ABNORMAL HIGH (ref 0.7–4.0)
MCH: 29.5 pg (ref 26.0–34.0)
MCHC: 33.9 g/dL (ref 30.0–36.0)
MCV: 86.9 fL (ref 80.0–100.0)
Monocytes Absolute: 0.5 10*3/uL (ref 0.1–1.0)
Monocytes Relative: 4 %
Neutro Abs: 4.3 10*3/uL (ref 1.7–7.7)
Neutrophils Relative %: 35 %
Platelets: 200 10*3/uL (ref 150–400)
RBC: 5.02 MIL/uL (ref 4.22–5.81)
RDW: 13.4 % (ref 11.5–15.5)
Smear Review: NORMAL
WBC: 12 10*3/uL — ABNORMAL HIGH (ref 4.0–10.5)
nRBC: 0 % (ref 0.0–0.2)

## 2020-09-04 LAB — TROPONIN I (HIGH SENSITIVITY): Troponin I (High Sensitivity): 6 ng/L (ref <18)

## 2020-09-04 LAB — COMPREHENSIVE METABOLIC PANEL
ALT: 54 U/L — ABNORMAL HIGH (ref 0–44)
AST: 58 U/L — ABNORMAL HIGH (ref 15–41)
Albumin: 4.6 g/dL (ref 3.5–5.0)
Alkaline Phosphatase: 120 U/L (ref 38–126)
Anion gap: 15 (ref 5–15)
BUN: <5 mg/dL — ABNORMAL LOW (ref 8–23)
CO2: 28 mmol/L (ref 22–32)
Calcium: 8.6 mg/dL — ABNORMAL LOW (ref 8.9–10.3)
Chloride: 93 mmol/L — ABNORMAL LOW (ref 98–111)
Creatinine, Ser: 0.57 mg/dL — ABNORMAL LOW (ref 0.61–1.24)
GFR, Estimated: >60 mL/min (ref >60)
Glucose, Bld: 100 mg/dL — ABNORMAL HIGH (ref 70–99)
Potassium: 4 mmol/L (ref 3.5–5.1)
Sodium: 136 mmol/L (ref 135–145)
Total Bilirubin: 1 mg/dL (ref 0.3–1.2)
Total Protein: 8.2 g/dL — ABNORMAL HIGH (ref 6.5–8.1)

## 2020-09-04 LAB — ETHANOL: Alcohol, Ethyl (B): 278 mg/dL — ABNORMAL HIGH (ref <10)

## 2020-09-04 LAB — ACETAMINOPHEN LEVEL: Acetaminophen (Tylenol), Serum: 12 ug/mL (ref 10–30)

## 2020-09-04 LAB — LIPASE, BLOOD: Lipase: 32 U/L (ref 11–51)

## 2020-09-04 LAB — SALICYLATE LEVEL: Salicylate Lvl: <7 mg/dL — ABNORMAL LOW (ref 7.0–30.0)

## 2020-09-04 MED ORDER — LORAZEPAM 2 MG/ML IJ SOLN
0.0000 mg | Freq: Two times a day (BID) | INTRAMUSCULAR | Status: AC
  Administered 2020-09-07: 1 mg via INTRAVENOUS
  Filled 2020-09-04: qty 1

## 2020-09-04 MED ORDER — THIAMINE HCL 100 MG/ML IJ SOLN
Freq: Once | INTRAVENOUS | Status: AC
  Filled 2020-09-04: qty 1000

## 2020-09-04 MED ORDER — LORAZEPAM 2 MG/ML IJ SOLN
0.0000 mg | Freq: Four times a day (QID) | INTRAMUSCULAR | Status: AC
  Administered 2020-09-05 – 2020-09-06 (×2): 2 mg via INTRAVENOUS
  Filled 2020-09-04 (×2): qty 1

## 2020-09-04 MED ORDER — THIAMINE HCL 100 MG/ML IJ SOLN: 100.0000 mg | Freq: Every day | INTRAMUSCULAR | Status: DC

## 2020-09-04 MED ORDER — LORAZEPAM 2 MG PO TABS
0.0000 mg | ORAL_TABLET | Freq: Two times a day (BID) | ORAL | Status: AC
  Administered 2020-09-07 – 2020-09-08 (×2): 1 mg via ORAL
  Filled 2020-09-04 (×2): qty 1

## 2020-09-04 MED ORDER — LORAZEPAM 2 MG PO TABS
0.0000 mg | ORAL_TABLET | Freq: Four times a day (QID) | ORAL | Status: AC
  Administered 2020-09-04 – 2020-09-06 (×5): 2 mg via ORAL
  Filled 2020-09-04 (×5): qty 1

## 2020-09-04 MED ORDER — ACETAMINOPHEN 325 MG PO TABS
650.0000 mg | ORAL_TABLET | Freq: Once | ORAL | Status: AC
  Administered 2020-09-04: 650 mg via ORAL
  Filled 2020-09-04: qty 2

## 2020-09-04 MED ORDER — THIAMINE HCL 100 MG PO TABS
100.0000 mg | ORAL_TABLET | Freq: Every day | ORAL | Status: DC
  Administered 2020-09-04 – 2020-09-13 (×10): 100 mg via ORAL
  Filled 2020-09-04 (×10): qty 1

## 2020-09-04 NOTE — ED Triage Notes (Signed)
Pt ems from home for headache. Pt states that he tripped and fell hitting head on concrete. No obvious injury.
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
## 2020-09-04 NOTE — ED Notes (Signed)
PT  VOL

## 2020-09-04 NOTE — ED Notes (Signed)
Patient Items:  Cristobal Goldmann Sweat Pants Black Socks

## 2020-09-05 DIAGNOSIS — F1020 Alcohol dependence, uncomplicated: Secondary | ICD-10-CM

## 2020-09-05 LAB — URINALYSIS, COMPLETE (UACMP) WITH MICROSCOPIC
Bilirubin Urine: NEGATIVE
Glucose, UA: NEGATIVE mg/dL
Hgb urine dipstick: NEGATIVE
Ketones, ur: NEGATIVE mg/dL
Leukocytes,Ua: NEGATIVE
Nitrite: NEGATIVE
Protein, ur: 100 mg/dL — AB
Specific Gravity, Urine: 1.012 (ref 1.005–1.030)
pH: 8 (ref 5.0–8.0)

## 2020-09-05 LAB — RESPIRATORY PANEL BY RT PCR (FLU A&B, COVID)
Influenza A by PCR: NEGATIVE
Influenza B by PCR: NEGATIVE
SARS Coronavirus 2 by RT PCR: NEGATIVE

## 2020-09-05 MED ORDER — FOLIC ACID 1 MG PO TABS
1.0000 mg | ORAL_TABLET | Freq: Every day | ORAL | Status: DC
  Administered 2020-09-05 – 2020-09-13 (×9): 1 mg via ORAL
  Filled 2020-09-05 (×9): qty 1

## 2020-09-05 MED ORDER — OMEPRAZOLE MAGNESIUM 20 MG PO TBEC: 20.0000 mg | DELAYED_RELEASE_TABLET | Freq: Every day | ORAL | Status: DC

## 2020-09-05 MED ORDER — HYDROXYZINE HCL 25 MG PO TABS
25.0000 mg | ORAL_TABLET | Freq: Three times a day (TID) | ORAL | Status: DC | PRN
  Administered 2020-09-08 – 2020-09-11 (×3): 25 mg via ORAL
  Filled 2020-09-05 (×3): qty 1

## 2020-09-05 MED ORDER — VITAMIN D3 25 MCG (1000 UNIT) PO TABS
5000.0000 [IU] | ORAL_TABLET | Freq: Every day | ORAL | Status: DC
  Administered 2020-09-05 – 2020-09-13 (×9): 5000 [IU] via ORAL
  Filled 2020-09-05 (×18): qty 5

## 2020-09-05 MED ORDER — ATENOLOL 25 MG PO TABS
25.0000 mg | ORAL_TABLET | Freq: Two times a day (BID) | ORAL | Status: DC
  Administered 2020-09-05 – 2020-09-13 (×15): 25 mg via ORAL
  Filled 2020-09-05 (×16): qty 1

## 2020-09-05 MED ORDER — PANTOPRAZOLE SODIUM 40 MG PO TBEC
40.0000 mg | DELAYED_RELEASE_TABLET | Freq: Every day | ORAL | Status: DC
  Administered 2020-09-05 – 2020-09-13 (×9): 40 mg via ORAL
  Filled 2020-09-05 (×9): qty 1

## 2020-09-05 MED ORDER — BACLOFEN 10 MG PO TABS
10.0000 mg | ORAL_TABLET | Freq: Three times a day (TID) | ORAL | Status: DC
  Administered 2020-09-05 – 2020-09-13 (×21): 10 mg via ORAL
  Filled 2020-09-05 (×30): qty 1

## 2020-09-05 MED ORDER — PAROXETINE HCL 30 MG PO TABS
30.0000 mg | ORAL_TABLET | Freq: Every day | ORAL | Status: DC
  Administered 2020-09-05 – 2020-09-13 (×9): 30 mg via ORAL
  Filled 2020-09-05 (×10): qty 1

## 2020-09-05 MED ORDER — ASPIRIN EC 81 MG PO TBEC
81.0000 mg | DELAYED_RELEASE_TABLET | Freq: Every day | ORAL | Status: DC
  Administered 2020-09-05 – 2020-09-13 (×9): 81 mg via ORAL
  Filled 2020-09-05 (×10): qty 1

## 2020-09-05 MED ORDER — SENNOSIDES-DOCUSATE SODIUM 8.6-50 MG PO TABS
2.0000 | ORAL_TABLET | Freq: Every day | ORAL | Status: DC
  Administered 2020-09-05 – 2020-09-12 (×8): 2 via ORAL
  Filled 2020-09-05 (×8): qty 2

## 2020-09-05 MED ORDER — DOXEPIN HCL 10 MG PO CAPS
10.0000 mg | ORAL_CAPSULE | Freq: Every evening | ORAL | Status: DC | PRN
  Administered 2020-09-08: 10 mg via ORAL
  Filled 2020-09-05 (×3): qty 1

## 2020-09-05 MED ORDER — LORAZEPAM 2 MG/ML IJ SOLN
2.0000 mg | Freq: Once | INTRAMUSCULAR | Status: AC
  Administered 2020-09-05: 2 mg via INTRAMUSCULAR
  Filled 2020-09-05: qty 1

## 2020-09-05 MED ORDER — ASCORBIC ACID 500 MG PO TABS
1000.0000 mg | ORAL_TABLET | Freq: Every day | ORAL | Status: DC
  Administered 2020-09-05 – 2020-09-13 (×9): 1000 mg via ORAL
  Filled 2020-09-05 (×9): qty 2

## 2020-09-05 MED ORDER — CALCIUM CARBONATE-VITAMIN D 500-200 MG-UNIT PO TABS
1.0000 | ORAL_TABLET | Freq: Every day | ORAL | Status: DC
  Administered 2020-09-06 – 2020-09-13 (×8): 1 via ORAL
  Filled 2020-09-05 (×8): qty 1

## 2020-09-05 MED ORDER — ACETAMINOPHEN 500 MG PO TABS
500.0000 mg | ORAL_TABLET | Freq: Three times a day (TID) | ORAL | Status: DC | PRN
  Administered 2020-09-07 – 2020-09-09 (×2): 500 mg via ORAL
  Filled 2020-09-05 (×2): qty 1

## 2020-09-05 MED ORDER — ADULT MULTIVITAMIN W/MINERALS CH
1.0000 | ORAL_TABLET | Freq: Every day | ORAL | Status: DC
  Administered 2020-09-05 – 2020-09-13 (×9): 1 via ORAL
  Filled 2020-09-05 (×9): qty 1

## 2020-09-05 NOTE — ED Notes (Signed)
Pt continuously yelling out for help. Multiple staff have attempted to assist pt. Pt attempting to get out of bed. Pt difficult to redirect at this time.

## 2020-09-05 NOTE — ED Notes (Signed)
Pt removed condom cath and bed soiled with urine. Pt and bed cleaned and new condom cath placed onto pt at this time

## 2020-09-05 NOTE — ED Notes (Signed)
Attempted to walk pt with assistance of walker. Pt able to stand off side of bed, but unable to take any steps or walk at this time. Pt right hand contracted and unable to grasp walker correctly at this time. Pt reports taking baclofen at home but not listed in pt PTA med list. MD informed of pt status, SW to attempt to find placement for pt.

## 2020-09-05 NOTE — ED Notes (Signed)
Patient provided with word search book as diversion activity.

## 2020-09-05 NOTE — ED Notes (Signed)
Pt given phone to call his sister.

## 2020-09-05 NOTE — TOC Initial Note (Signed)
Transition of Care (TOC) - Progression Note    Patient Details  Name: Douglas Peterson. MRN: [id number] Date of Birth: 1951-04-05  Transition of Care [facility]) CM/SW [place], [place] Phone Number: [phone] 09/05/2020, 12:30 PM  Clinical Narrative:     [name] presents to [facility] ED due to fall.  CSW spoke with EDP and he states the patient is appropiate for d/c home, plus the patient has requested to return home.  CSW called [name] (Daughter) [phone], and left voicemail to find out if she can transport him home.        Expected Discharge Plan and Services                                                 Social Determinants of Health (SDOH) Interventions    Readmission Risk Interventions Readmission Risk Prevention Plan 08/23/2020 08/19/2020 08/14/2020  Transportation Screening Complete Complete Complete  PCP or Specialist Appt within 3-5 Days - - -  HRI or [place] - - -  Palliative Care Screening - - -  Medication Review (RN Care Manager) Complete Complete -  PCP or Specialist appointment within 3-5 days of discharge (No Data) Complete Complete  HRI or Home Care Consult Complete - Complete  SW Recovery Care/Counseling Consult - Complete Complete  Palliative Care Screening - Not Applicable Not [place] Not Applicable - Patient Refused  Some recent data might be hidden

## 2020-09-05 NOTE — Discharge Instructions (Addendum)
Please be careful.  Do not drink before you drive or while you are driving.  Try not to drink too much sodium note fall down.  Please follow-up with your regular doctor.  We will see if we can get home health to come and make sure you have good physical therapy and good strength.

## 2020-09-05 NOTE — ED Provider Notes (Addendum)
Patient trying to get up to leave. Went and discussed with the patient. At this time patient states he would like to be discharged home. Would NOT be willing to go to living facility. I did discuss with the patient strong recommendation for living facility. Did discuss PTs evaluation with patient earlier. Do have concern for patient's safety at home, however he repeated he is NOT willing to go to a living facility. Psychiatry did evaluate patient and did not feel he meet involuntary commitment criteria, and on my exam he does not appear to lack capacity to make his own decisions. Will discharge home.   Nance Pear, MD 09/05/20 1843   Transport was delayed for the patient. On reassessment at this time he now is more confused and disoriented. Do think he could be sundowning. He is trying to crawl out of the bed. Will give some medicine to try to help calm him down. At this time given concern for safety and that he is now less oriented will plan on at least keeping him in the ED until he is less confused.    Nance Pear, MD 09/05/20 [id number]

## 2020-09-05 NOTE — ED Notes (Signed)
Patient continues to attempt to get out of bed despite constant verbal redirection. RN has situated patient back in bed and re-oriented them to their environment and plan of care numerous times without evidence of understanding. Pt remains in direct view of RN at nurses station. RN to continue to monitor.

## 2020-09-05 NOTE — ED Provider Notes (Signed)
PT has evaluated the patient, he is unable to ambulate on his own, TOC will look for placement   Lavonia Drafts, MD 09/05/20 1353

## 2020-09-05 NOTE — ED Notes (Signed)
Patient agitated and confused yelling at staff and attempting to get out of bed. Pt verbally re-directed multiple times without success. Pt continues to be confused and believes that he is in a building at [facility] in the year 2084. RN explained to patient that he is in the Emergency Department at [facility] in [place] following a fall at home. Patient briefly verbalized understanding of this fact and then began yelling and attempting to get back out of bed. Pt redirected into bed and placed in slight reverse trendelenburg with HOB raised 45 degrees and legs propped to promote sitting position. Pt in bed with side rails raised x2 and in direct view of RN from nurses station. Pt continues to yell and remains confused. Yellow non skid socks on patient as well as fall band. RN to continue to monitor.

## 2020-09-05 NOTE — ED Notes (Signed)
Hourly rounding completed at this time, patient currently awake in room. No complaints, stable, and in no acute distress. Q15 minute rounds and monitoring via Engineer, drilling to continue.

## 2020-09-05 NOTE — BH Assessment (Signed)
Writer spoke with the patient to complete an updated/reassessment. Patient denies SI/HI and AV/H. He concerned about his fall and hitting his head. He denies wanting help for his alcohol use. Patient requesting to go home, because he left his garage door open. He was giving the phone to contact his sister.

## 2020-09-05 NOTE — ED Notes (Signed)
Pt moved to room 20 at this time. Pt placed on mattress on the floor with other mattress beside. Pt also has fall pads covering entire floor to prevent injury and fall. Pt attempts to barrel roll continuously. Rover is at doorway for safety, pt placed in paper scrubs for safety due to gown wrapping around himself, will continue to monitor.

## 2020-09-05 NOTE — ED Notes (Signed)
Pt has rolled over entire room since arriving in room. Pt is confused and has no idea what is going on. Asks to go down steps, asks what happened to the windows in the garage door, and is weak and unable to stand. MD Archie Balboa notified of pts updated CIWA. States to continue to monitor and no interventions at this time.

## 2020-09-05 NOTE — Evaluation (Signed)
Physical Therapy Evaluation Patient Details Name: Douglas Peterson. MRN: [id number] DOB: 1951-06-24 Today's Date: 09/05/2020   History of Present Illness  Douglas Peterson. is a 42HCW who comes to [facility] on 10/16 after fall at home. Pt found by EMS in his car with blood on back of head, pt reportedly fell in garage earlier in day, hitting head and buttocks. 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 Pt recently DC back to home on 10/12 after SIRS admission.     Clinical Impression  Pt received in stretcher in ED and agreeable to therapy.  Pt reports he attempted to stand with nursing and ambulate with FWW however it did not go well.  Pt performed bed mobility requiring hand held assistance from therapist for transfers with modA.  Pt then proceeded to attempt to utilize FWW, however due to hemiplegic  R side, was unable to navigate with FWW.  Pt utilized HHA on LUE and performed much better and was able to ambulate down hall and back to stretcher.  Pt had difficulty transferring back to bed due to height of stretcher, however was able to come back to supine with assistance from therapist.  Pt then proceeded to perform bed-level exercises without much difficulty.  Pt left with all needs met and nursing communicated with about mobility status.  Pt will benefit from skilled PT intervention to increase independence and safety with basic mobility in preparation for discharge to the venue listed below.         Follow Up Recommendations SNF; Supervision for mobility/OOB    Equipment Recommendations  None recommended by PT    Recommendations for Other Services       Precautions / Restrictions Precautions Precautions: Fall Required Braces or Orthoses:  (no sling needed as of 10/21 per Dr. Mack Guise) Restrictions Weight Bearing Restrictions: Yes RUE Weight Bearing: Weight bearing as tolerated Other Position/Activity  Restrictions: Rt clavicle fx on 06/05/20      Mobility  Bed Mobility Overal bed mobility: Needs Assistance Bed Mobility: Supine to Sit     Supine to sit: Mod assist Sit to supine: Mod assist   General bed mobility comments: modA for trunk support and HHA for coming upright.    Transfers Overall transfer level: Needs assistance Equipment used: 1 person hand held assist;Rolling walker (2 wheeled) Transfers: Sit to/from Stand Sit to Stand: Mod assist         General transfer comment: Performed STS x3, switching from FWW to hand held assist.  Ambulation/Gait Ambulation/Gait assistance: Mod assist Gait Distance (Feet): 25 Feet Assistive device: 1 person hand held assist Gait Pattern/deviations: Step-to pattern Gait velocity: decreased   General Gait Details: Pt unable to utilized FWW due to hemiplegia on the R, however could utilize HHA from therapist on the LUE and performed much better, ambulating down hallway and back to bed.  Stairs            Wheelchair Mobility    Modified Rankin (Stroke Patients Only)       Balance Overall balance assessment: Needs assistance;History of Falls Sitting-balance support: Single extremity supported;Feet unsupported Sitting balance-Leahy Scale: Good     Standing balance support: Single extremity supported;During functional activity Standing balance-Leahy Scale: Fair Standing balance comment: cannot tolerate challenge                             Pertinent Vitals/Pain  Pain Assessment: No/denies pain    Home Living Family/patient expects to be discharged to:: Private residence Living Arrangements: Alone Available Help at Discharge: Family (sister was checking on pt intermittently prior, but has apparently not in some time.) Type of Home: House Home Access: Stairs to enter Entrance Stairs-Rails: Right;Left;Can reach both Entrance Stairs-Number of Steps: 3 with no rails (from garage) or 5 with bilateral rails  (from front entrance) Home Layout: One level Home Equipment: Cane - single point;Cane - quad;Walker - 2 wheels;Tub bench;Grab bars - tub/shower;Shower seat;Grab bars - toilet Additional Comments: RUE Resting hand splint - did use it in past - but not recently per pt    Prior Function Level of Independence: Independent with assistive device(s)         Comments: Pt recently d/c from SNF. Ambulates c SPC; h/o falls     Hand Dominance   Dominant Hand: Left    Extremity/Trunk Assessment   Upper Extremity Assessment Upper Extremity Assessment: RUE deficits/detail RUE Deficits / Details: chronic RUE spastic hemiplegia. Recent clavicular fx ~8wks ago LUE Deficits / Details: AROM grossly WFL    Lower Extremity Assessment Lower Extremity Assessment: RLE deficits/detail;Generalized weakness RLE Deficits / Details: Chronic spastic hemiplegia       Communication   Communication: Other (comment) (somewhat raspy and some mumbles at baseline, but overall discernable.)  Cognition Arousal/Alertness: Awake/alert Behavior During Therapy: WFL for tasks assessed/performed Overall Cognitive Status: Within Functional Limits for tasks assessed                                        General Comments      Exercises Total Joint Exercises Ankle Circles/Pumps: AROM;Strengthening;Both;10 reps;Supine Quad Sets: AROM;Strengthening;Both;10 reps;Supine Gluteal Sets: AROM;Strengthening;Both;10 reps;Supine Hip ABduction/ADduction: AROM;Strengthening;Both;10 reps;Supine Straight Leg Raises: AROM;Strengthening;Both;10 reps;Supine Long Arc Quad: AROM;Strengthening;Both;10 reps;Seated Marching in Standing: AROM;Strengthening;Both;10 reps;Standing Other Exercises Other Exercises: Pt educated on role of PT and services provided during hospital stay. Other Exercises: Pt educated on importance of mobility and to continue with exercises in between bouts of therapy.   Assessment/Plan    PT  Assessment Patient needs continued PT services  PT Problem List Decreased strength;Decreased activity tolerance;Decreased balance;Decreased mobility       PT Treatment Interventions DME instruction;Gait training;Stair training;Functional mobility training;Therapeutic activities;Therapeutic exercise;Balance training;Patient/family education    PT Goals (Current goals can be found in the Care Plan section)  Acute Rehab PT Goals Patient Stated Goal: get back to home PT Goal Formulation: With patient Time For Goal Achievement: 09/19/20 Potential to Achieve Goals: Good    Frequency Min 2X/week   Barriers to discharge Decreased caregiver support;Inaccessible home environment      Co-evaluation               AM-PAC PT "6 Clicks" Mobility  Outcome Measure Help needed turning from your back to your side while in a flat bed without using bedrails?: A Little Help needed moving from lying on your back to sitting on the side of a flat bed without using bedrails?: A Little Help needed moving to and from a bed to a chair (including a wheelchair)?: A Lot Help needed standing up from a chair using your arms (e.g., wheelchair or bedside chair)?: A Lot Help needed to walk in hospital room?: A Lot Help needed climbing 3-5 steps with a railing? : A Lot 6 Click Score: 14    End of Session Equipment  Utilized During Treatment: Gait belt Activity Tolerance: Patient tolerated treatment well;Patient limited by fatigue Patient left: in bed;with nursing/sitter in room Nurse Communication: Mobility status PT Visit Diagnosis: Unsteadiness on feet (R26.81);Repeated falls (R29.6);Muscle weakness (generalized) (M62.81);History of falling (Z91.81);Difficulty in walking, not elsewhere classified (R26.2)    Time: 9396-8864 PT Time Calculation (min) (ACUTE ONLY): 40 min   Charges:   PT Evaluation $PT Eval Low Complexity: 1 Low PT Treatments $Gait Training: 23-37 mins $Therapeutic Exercise: 8-22  mins        Gwenlyn Saran, PT, DPT 09/05/20, 5:46 PM

## 2020-09-05 NOTE — ED Notes (Addendum)
Dr. Archie Balboa to bedside to observe pt actions. Pt was rolling in floor on pads trying to crawl out of room, pt had pushed door together and was unable to get out of room by crawling. Pt assisted back to his mattress by this nurse, Mayra, Tech, and [place], [place]. Charge RN, Raquel, is also keeping up with situation
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
## 2020-09-05 NOTE — ED Notes (Signed)
Pt stated he did not want his sister given information about him or his stay here at the hospital.

## 2020-09-05 NOTE — ED Notes (Signed)
Pt given breakfast tray

## 2020-09-05 NOTE — ED Notes (Signed)
Pt reports he takes atenolol and baclofen and is requesting medications at this time. When completing medication review atenolol noted and MD notified medication currently not ordered. MD states orders will be placed for atenolol.

## 2020-09-06 MED ORDER — LORAZEPAM 2 MG/ML IJ SOLN
2.0000 mg | Freq: Once | INTRAMUSCULAR | Status: AC
  Administered 2020-09-06: 2 mg via INTRAVENOUS
  Filled 2020-09-06: qty 1

## 2020-09-06 NOTE — ED Notes (Signed)
Hourly rounding completed at this time, patient currently awake in room. No complaints, stable, and in no acute distress. Q15 minute rounds and monitoring via Engineer, drilling to continue.

## 2020-09-06 NOTE — ED Notes (Signed)
Hourly rounding completed at this time, patient currently asleep in room. No complaints, stable, and in no acute distress. Q15 minute rounds and monitoring via Engineer, drilling to continue.

## 2020-09-06 NOTE — ED Notes (Signed)
Hourly rounding reveals patient in room. No complaints, stable, in no acute distress. Q15 minute rounds and monitoring via Engineer, drilling to continue.

## 2020-09-06 NOTE — ED Provider Notes (Signed)
Emergency Medicine Observation Re-evaluation Note  Douglas Peterson. is a 69 y.o. male, seen on rounds today.  Pt initially presented to the ED for complaints of Head Injury Currently, the patient is resting, voices no medical complaints.  Physical Exam  BP (!) 171/95 (BP Location: Right Arm)   Pulse 85   Temp 98.4 F (36.9 C) (Oral)   Resp 18   Ht 5' 4"  (1.626 m)   Wt 54.5 kg   SpO2 97%   BMI 20.62 kg/m  Physical Exam General: Resting in no acute distress Cardiac: No cyanosis Lungs: Equal rise and fall Psych: Not agitated  ED Course / MDM  EKG:    I have reviewed the labs performed to date as well as medications administered while in observation.  Recent changes in the last 24 hours include Ativan bolus given overnight in addition to CIWA medications for agitation.  This was given with good effect.  Plan  Current plan is for TOC/TTS evaluations. [name] not under full IVC at this time.   Paulette Blanch, MD 09/06/20 [phone]

## 2020-09-06 NOTE — TOC Progression Note (Addendum)
Transition of Care (TOC) - Progression Note    Patient Details  Name: Douglas Peterson. MRN: [id number] Date of Birth: 1951/05/19  Transition of Care [facility]) CM/SW [place], [place] Phone Number:  [phone] 09/06/2020, 3:18 PM  Clinical Narrative:     CSW sent FL2 and CSW referral to several SNF.  Patient pending placement, EDP updated. [place] [facility] [phone] following, and spoke with the patient.   Expected Discharge Plan: Skilled Nursing Facility Barriers to Discharge: SNF Pending bed offer, ED SNF auth  Expected Discharge Plan and Services Expected Discharge Plan: [place] In-house Referral: Clinical Social Work     Living arrangements for the past 2 months: Single Family Home                                       Social Determinants of Health (SDOH) Interventions    Readmission Risk Interventions Readmission Risk Prevention Plan 08/23/2020 08/19/2020 08/14/2020  Transportation Screening Complete Complete Complete  PCP or Specialist Appt within 3-5 Days - - -  [place]RI or [place] - - -  Palliative Care Screening - - -  Medication Review (RN Care Manager) Complete Complete -  PCP or Specialist appointment within 3-5 days of discharge (No Data) Complete Complete  HRI or Home Care Consult Complete - Complete  SW Recovery Care/Counseling Consult - Complete Complete  Palliative Care Screening - Not Applicable Not [place] Not Applicable - Patient Refused  Some recent data might be hidden

## 2020-09-06 NOTE — ED Notes (Signed)
VOL  PENDING  PLACEMENT

## 2020-09-06 NOTE — ED Notes (Addendum)
Pt continually anxious and attempting to crawl out of room, requires continued redirection. MD notified, orders to follow.

## 2020-09-06 NOTE — ED Notes (Signed)
Pt up to eat in recliner.  Brief changed and pt laying back in bed.  Warm blankets given.

## 2020-09-06 NOTE — ED Notes (Signed)
Pt continually talking about not allowing other people buying his house, which he is referring to the ED/room as his house. Pt unable to be reoriented from this topic.

## 2020-09-06 NOTE — ED Notes (Signed)
Pt given meal tray at this time 

## 2020-09-06 NOTE — ED Notes (Signed)
Pt incontinent of urine. Pt cleaned and new clothes applied by this RN and Faith, NT. Pt able to lift hips to assist with changing of clothes, but requiring heavy assistance by staff. Pt repositioned into recliner chair at this time. Pt denies any further needs.

## 2020-09-06 NOTE — ED Notes (Signed)
Snack and beverage given. 

## 2020-09-06 NOTE — ED Notes (Signed)
Pt expressing AH at this time and informed Mckenzie, RN that he is speaking to individual named Antony Haste. MD notified.

## 2020-09-06 NOTE — ED Notes (Signed)
Hourly rounding completed at this time, patient currently awake in room. No complaints, stable, and in no acute distress. Q15 minute rounds and monitoring via Engineer, drilling to continue. Pt is incontinent and urinated on self in brief. Pt had removed pants and brief. This nurse assisted pt with clean brief. Pt assisted with returning to bed.

## 2020-09-06 NOTE — ED Notes (Signed)
[name] sleeping at this time. Vital signs not taken. Will try again.

## 2020-09-06 NOTE — ED Notes (Signed)
Pt had an IV in his right hand/thumb area. Pt seen removing by this nurse and unable to enter room quick enough to prevent removal by Pt. Sight cleaned and pt shirt changed due to blood being on it. Pt returned to mattress by this nurse and Alyson Ingles, Therapist, sports.

## 2020-09-06 NOTE — ED Notes (Signed)
Pt incontinent of urine. Changed by this Rn and faith, nt. Patient assisted back to bed at this time.

## 2020-09-06 NOTE — ED Notes (Signed)
Pt sleeping at this time, unable to obtain vitals. Will collect when pt wakes up.

## 2020-09-06 NOTE — NC FL2 (Signed)
[place] LEVEL OF CARE SCREENING TOOL     IDENTIFICATION  Patient Name: Douglas Peterson. Birthdate: 16-Aug-1951 Sex: male Admission Date (Current Location): 09/04/2020  [place] and [place] Number:  Engineering geologist and Address:  [facility], [address] [place], [place] [address]      Provider Number: [id number]  Attending Physician Name and Address:  No att. providers found  Relative Name and Phone Number:  [name] (Daughter) [phone]    Current Level of Care: Hospital Recommended Level of Care: [place] Prior Approval Number:    Date Approved/Denied:   PASRR Number: [id number]  Discharge Plan: SNF    Current Diagnoses: Patient Active Problem List   Diagnosis Date Noted  . Poor social situation 08/18/2020  . Lactic acidosis 08/18/2020  . Cellulitis 08/18/2020  . SIRS (systemic inflammatory response syndrome) ([place]) 08/05/2020  . Right hip pain 08/05/2020  . Hypophosphatemia 08/05/2020  . Closed fracture of distal clavicle_Right 06/05/2020  . GERD (gastroesophageal reflux disease) 06/05/2020  . Alcohol use disorder, severe, dependence ([place]) 05/28/2020  . Acquired thrombophilia ([place])   . Elevated LFTs 04/06/2020  . Fall 04/06/2020  . Rhabdomyolysis 04/06/2020  . Elevated lactic acid level 04/06/2020  . Sepsis ([place]) 03/17/2020  . Constipation 03/17/2020  . Paroxysmal A-fib ([place]) 03/17/2020  . Severe protein-calorie malnutrition ([place]) 02/25/2020  . Left inguinal hernia 01/31/2020  . Encounter for competency evaluation   . Depression 01/17/2020  . Esophagitis   . Nausea without vomiting   . Weakness   . Tremor   . Pancytopenia ([place])   . Hypomagnesemia   . Alcoholic cirrhosis of liver without ascites ([place])   . Thrombocytopenia ([place])   . Non-traumatic rhabdomyolysis   . Alcohol withdrawal delirium ([place]) 12/08/2019  . ETOH abuse   . Alcoholic intoxication with complication ([place])   .  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 Hypokalemia 09/27/2019  . Dysphagia 09/27/2019  . Alcohol withdrawal delirium, acute, hyperactive ([place]) 09/21/2019  . Pressure injury of ankle, stage 1 08/15/2019  . Goals of care, counseling/discussion   . Palliative care by specialist   . Alcohol withdrawal ([place]) 03/20/2019  . Low back pain 11/15/2018  . Acute metabolic encephalopathy 46/50/3546  . Delirium tremens ([place]) 03/08/2018  . Malnutrition of moderate degree 11/24/2017  . HTN (hypertension) 09/16/2017  . Hypothermia 12/17/2016  . Alcohol use   . Diarrhea   . Hyponatremia 07/09/2015    Orientation RESPIRATION BLADDER Height & Weight     Self, Place, Situation  Normal Continent Weight: 120 lb 2.4 oz (54.5 kg) Height:  5' 4"  (162.6 cm)  BEHAVIORAL SYMPTOMS/MOOD NEUROLOGICAL BOWEL NUTRITION STATUS      Continent Diet  AMBULATORY STATUS COMMUNICATION OF NEEDS Skin   Limited Assist Verbally Normal                       Personal Care Assistance Level of Assistance  Bathing, Feeding, Dressing Bathing Assistance: Limited assistance Feeding assistance: Limited assistance Dressing Assistance: Limited assistance     Functional Limitations Info  Sight, Hearing, Speech Sight Info: Adequate Hearing Info: Adequate Speech Info: Adequate    SPECIAL CARE FACTORS FREQUENCY        PT Frequency: Min 2x per week              Contractures Contractures Info: Not present    Additional Factors Info  Current Medications (09/06/2020):  This is the current hospital active medication list Current Facility-Administered Medications  Medication Dose Route Frequency Provider Last Rate Last Admin  . acetaminophen (TYLENOL) tablet 500 mg  500 mg Oral Q8H PRN Lavonia Drafts, MD      . ascorbic acid (VITAMIN C) tablet 1,000 mg  1,000 mg Oral Daily Lavonia Drafts, MD   1,000 mg at 09/06/20 0955  . aspirin EC tablet 81 mg  81 mg Oral  Daily Lavonia Drafts, MD   81 mg at 09/06/20 0957  . atenolol (TENORMIN) tablet 25 mg  25 mg Oral BID Lavonia Drafts, MD   25 mg at 09/05/20 2148  . baclofen (LIORESAL) tablet 10 mg  10 mg Oral TID Lavonia Drafts, MD   10 mg at 09/06/20 0959  . calcium-vitamin D (OSCAL WITH D) 500-200 MG-UNIT per tablet 1 tablet  1 tablet Oral Q breakfast Lavonia Drafts, MD   1 tablet at 09/06/20 0958  . cholecalciferol (VITAMIN D) tablet 5,000 Units  5,000 Units Oral Daily Lavonia Drafts, MD   5,000 Units at 09/06/20 0956  . doxepin (SINEQUAN) capsule 10 mg  10 mg Oral QHS PRN Lavonia Drafts, MD      . folic acid (FOLVITE) tablet 1 mg  1 mg Oral Daily Lavonia Drafts, MD   1 mg at 09/06/20 0955  . hydrOXYzine (ATARAX/VISTARIL) tablet 25 mg  25 mg Oral TID PRN Lavonia Drafts, MD      . LORazepam (ATIVAN) injection 0-4 mg  0-4 mg Intravenous Q6H Nena Polio, MD   2 mg at 09/06/20 0458   Or  . LORazepam (ATIVAN) tablet 0-4 mg  0-4 mg Oral Q6H Nena Polio, MD   2 mg at 09/06/20 0954  . [START ON 09/07/2020] LORazepam (ATIVAN) injection 0-4 mg  0-4 mg Intravenous Q12H Nena Polio, MD       Or  . Derrill Memo ON 09/07/2020] LORazepam (ATIVAN) tablet 0-4 mg  0-4 mg Oral Q12H Nena Polio, MD      . multivitamin with minerals tablet 1 tablet  1 tablet Oral Daily Lavonia Drafts, MD   1 tablet at 09/06/20 0957  . pantoprazole (PROTONIX) EC tablet 40 mg  40 mg Oral Daily Berta Minor, RPH   40 mg at 09/06/20 0955  . PARoxetine (PAXIL) tablet 30 mg  30 mg Oral Daily Lavonia Drafts, MD   30 mg at 09/06/20 0959  . senna-docusate (Senokot-S) tablet 2 tablet  2 tablet Oral Wyvonna Plum, MD   2 tablet at 09/05/20 2148  . thiamine tablet 100 mg  100 mg Oral Daily Nena Polio, MD   100 mg at 09/06/20 2992   Or  . thiamine (B-1) injection 100 mg  100 mg Intravenous Daily Nena Polio, MD       Current Outpatient Medications  Medication Sig Dispense Refill  . acetaminophen (TYLENOL) 500 MG tablet Take 1  tablet (500 mg total) by mouth every 8 (eight) hours as needed for mild pain or fever. 15 tablet 0  . Ascorbic Acid (VITAMIN C) 1000 MG tablet Take 1,000 mg by mouth daily.    [name] aspirin EC 81 MG tablet Take 81 mg by mouth daily. Swallow whole.    [name] atenolol (TENORMIN) 25 MG tablet Take 25 mg by mouth 2 (two) times daily.     . baclofen (LIORESAL) 10 MG tablet Take 10 mg by mouth 3 (three) times daily.    . calcium-vitamin D (  OSCAL WITH D) 500-200 MG-UNIT tablet Take 1 tablet by mouth daily with breakfast.    . Cholecalciferol 125 MCG (5000 UT) TABS Take 5,000 Units by mouth daily.    [name] doxepin (SINEQUAN) 10 MG capsule Take 10 mg by mouth at bedtime as needed (sleep).     . feeding supplement, ENSURE ENLIVE, (ENSURE ENLIVE) LIQD Take 237 mLs by mouth 2 (two) times daily between meals. 803 mL 12  . folic acid (FOLVITE) 1 MG tablet Take 1 tablet (1 mg total) by mouth daily. 30 tablet 0  . hydrOXYzine (ATARAX/VISTARIL) 25 MG tablet Take 25 mg by mouth 3 (three) times daily as needed for anxiety or itching.     . Multiple Vitamin (MULTIVITAMIN WITH MINERALS) TABS tablet Take 1 tablet by mouth daily. 30 tablet 0  . PARoxetine (PAXIL) 30 MG tablet Take 30 mg by mouth daily.    [name] senna-docusate (SENOKOT-S) 8.6-50 MG tablet Take 2 tablets by mouth at bedtime. 30 tablet 1  . thiamine 100 MG tablet Take 1 tablet (100 mg total) by mouth daily. 30 tablet 0  . omeprazole (PRILOSEC OTC) 20 MG tablet Take 1 tablet (20 mg total) by mouth daily. (Patient not taking: Reported on 09/05/2020) 28 tablet 1     Discharge Medications: Please see discharge summary for a list of discharge medications.  Relevant Imaging Results:  Relevant Lab Results:   Additional Information SS# [id number]  Adelene Amas, LCSWA

## 2020-09-06 NOTE — ED Notes (Signed)
Report to include Situation, Background, Assessment, and Recommendations received from [facility]. Patient alert and oriented, warm and dry, in no acute distress. Patient denies SI, HI, AVH and pain. Patient made aware of Q15 minute rounds and Engineer, drilling presence for their safety. Patient instructed to come to me with needs or concerns.

## 2020-09-07 LAB — URINE DRUG SCREEN, QUALITATIVE (ARMC ONLY)
Amphetamines, Ur Screen: NOT DETECTED
Barbiturates, Ur Screen: NOT DETECTED
Benzodiazepine, Ur Scrn: POSITIVE — AB
Cannabinoid 50 Ng, Ur ~~LOC~~: NOT DETECTED
Cocaine Metabolite,Ur ~~LOC~~: NOT DETECTED
MDMA (Ecstasy)Ur Screen: NOT DETECTED
Methadone Scn, Ur: NOT DETECTED
Opiate, Ur Screen: NOT DETECTED
Phencyclidine (PCP) Ur S: NOT DETECTED
Tricyclic, Ur Screen: NOT DETECTED

## 2020-09-07 NOTE — ED Notes (Signed)
Pt assisted to use urinal. Pt started urinating in his briefs right before urinal placement. Briefs changed after pt finished with urinal. Urine amber in color.

## 2020-09-07 NOTE — ED Notes (Signed)
Hourly rounding reveals patient in room. No complaints, stable, in no acute distress. Q15 minute rounds and monitoring via Engineer, drilling to continue.

## 2020-09-07 NOTE — ED Notes (Addendum)
Pt leaned over side of chair in attempt to pick up drink cup he accidentally knocked off the armrest. Floor cleaned up. Pt educated about fall risk. Pt given two fresh warm blankets.

## 2020-09-07 NOTE — ED Notes (Signed)
Pharmacy messaged to send [place] with D

## 2020-09-07 NOTE — ED Notes (Addendum)
Pt offered snacks but stated he did not like any of the items offered. Reminded that lunch should come up soon. Called PT to notify that pt alert and it would be a good time to work with pt if able. Top of recliner laid back to assist pt in repositioning; pt was able to use his legs to shift self up in recliner; top of recliner put at 75 degrees to take meds and prepare for lunch.

## 2020-09-07 NOTE — ED Notes (Signed)
This RN sat at bedside with pt for last 30 mins conversing. Pt reports history of working long hours to provide for family which he states eventually led to his divorce. Reports son Douglas Peterson is addicted to drugs and he hasn't been in contact with him in about 4-5 years. States talks with his daughter regularly. States wishes to get back in contact with his son. 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 Started drinking after work then it increased after his divorce and retirement. Pt worried about where his Lexus might be since he isn't sure how he got to the ER. Hasn't found a major hobby or group yet to plug into which he feels would help. Pt denies being religious but agreed to allow this RN to pray for him and his family. Prayed with pt in expectation for God to reveal Himself, to heal pt and his family, and to "bless their socks off". Pt received prayer well and thanked this Therapist, sports.

## 2020-09-07 NOTE — ED Notes (Signed)
Pharmacy messaged to send baclofen and paxil

## 2020-09-07 NOTE — ED Notes (Signed)
New order placed to ambulate pt in hall PT came to eval/walk pt but were unable to d/t sedation from Ativan - PT stated to call them when pt aroused and they would return to ambulate pt

## 2020-09-07 NOTE — ED Notes (Signed)
Pt assisted to reposition. Given a cup of soda as requested. Denies any other needs currently.

## 2020-09-07 NOTE — ED Notes (Signed)
Quad NT completing 74mn checks throughout entire shift.

## 2020-09-07 NOTE — ED Provider Notes (Signed)
Emergency Medicine Observation Re-evaluation Note  Douglas Peterson. is a 69 y.o. male, seen on rounds today.  Pt initially presented to the ED for complaints of Head Injury Currently, the patient is calm cooperative, no distress.[name]  Physical Exam  BP 136/62   Pulse (!) 57   Temp 98.4 F (36.9 C) (Oral)   Resp 18   Ht 5' 4"  (1.626 m)   Wt 54.5 kg   SpO2 95%   BMI 20.62 kg/m  Physical Exam General: Resting comfortably.  No acute issues overnight.  ED Course / MDM    No new labs over the past 24 hours.  Plan  Current plan is for placement. [name] not under full IVC at this time.   Harvest Dark, MD 09/07/20 [phone]

## 2020-09-07 NOTE — ED Notes (Signed)
Lab called and told this writer that The urine that have can't be used for UDS because it is old. [name] wearing Adult diapers and unable to collect urine. Informed patient to let staff know when he feels like to go to the bathroom so we can give him urinal.

## 2020-09-07 NOTE — ED Notes (Signed)
Patient to recliner watching tv and eating breakfast. Hair combed and face washed

## 2020-09-07 NOTE — ED Notes (Signed)
Pr sleeping in recliner RN woke pt up to check VS and CIWA. Pt calm, cooperative no distress noted. RN assisted pt to mattress to sleep, pt has difficulty walking

## 2020-09-07 NOTE — ED Notes (Signed)
Pt stated he needed to urinate. Assisted with the urinal. Briefs dry. Pt did not urinate. Agreed to let this RN or the NT know if he needs assistance with urinal again.

## 2020-09-07 NOTE — Progress Notes (Signed)
PT Cancellation Note  Patient Details Name: Douglas Peterson. MRN: [id number] DOB: 1951/09/16   Cancelled Treatment:     PT attempt. Pt too lethargic to safely participate with PT at this time. Issued ativan this morning. Pt unable to take sip of apple juice without spilling it due to tremors. RN to contact author when pt is more alert and appropriate to participate in PT. Will return later this date.    Willette Pa 09/07/2020, 10:16 AM

## 2020-09-07 NOTE — ED Notes (Signed)
AM meds held until pt is more awake - Pt is resting d/t ativan that was given and not sure that he would be able to safely swallow meds at this time

## 2020-09-07 NOTE — ED Notes (Signed)
NT's conversing with pt while he eats dinner.

## 2020-09-07 NOTE — Progress Notes (Signed)
Physical Therapy Treatment Patient Details Name: Douglas Peterson. MRN: [id number] DOB: 06-28-1951 Today's Date: 09/07/2020    History of Present Illness Douglas Peterson. is a 64PPI who comes to [facility] on 10/16 after fall at home. Pt found by EMS in his car with blood on back of head, pt reportedly fell in garage earlier in day, hitting head and buttocks. 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 Pt recently DC back to home on 10/12 after SIRS admission.    PT Comments    Pt was asleep in recliner with lunch tray resting in his lap. He easily awakes and agrees to PT session. Author knows patient well from previous admissions. Pt present with a lot more physical deficits than previously observed. He was able to stand with mod assist from recliner to [facility] [place]. Required constant mod assist + heavy use of gait belt to lateral wt shift to allow opposite LE progression. Pt is at very high fall risk. Will benefit from continued skilled PT at DC to address deficits and assist pt to PLOF.    Follow Up Recommendations  SNF     Equipment Recommendations  None recommended by PT    Recommendations for Other Services       Precautions / Restrictions Precautions Precautions: Fall Precaution Comments: 66week old, clavicular fracture, ortho rec pending Restrictions Weight Bearing Restrictions: No    Mobility  Bed Mobility    General bed mobility comments: pt was in recliner pre/post session  Transfers Overall transfer level: Needs assistance Equipment used: Straight cane Transfers: Sit to/from Stand Sit to Stand: Mod assist         General transfer comment: mod assist + max vcs to perform STS from recliner to static standing holding SPC. pt present with posterior LOB upon standing but once he finds neautral is able to maintain with min assist only.   Ambulation/Gait Ambulation/Gait assistance: Mod assist Gait  Distance (Feet): 12 Feet Assistive device: Straight cane Gait Pattern/deviations: Shuffle;Staggering left;Staggering right Gait velocity: decreased   General Gait Details: pt struggles to clear BLEs off floor to advance to taking steps. Heavy use of gait belt for safety and to assist pt with lateral wt shift to allow opposite LE advancement. pt extremely high fall risk.        Balance Overall balance assessment: Needs assistance;History of Falls Sitting-balance support: Single extremity supported;Feet supported Sitting balance-Leahy Scale: Fair     Standing balance support: Single extremity supported;During functional activity Standing balance-Leahy Scale: Poor Standing balance comment: poor balance in standing even with +1 UE support. RUE affected by previous stroke.        Cognition Arousal/Alertness: Lethargic Behavior During Therapy:  (lethargic suspected from meds) Overall Cognitive Status: Impaired/Different from baseline (lethargic this date from medications previously issued) Area of Impairment: Awareness;Problem solving;Safety/judgement;Attention                Pertinent Vitals/Pain Pain Assessment: No/denies pain Pain Score: 0-No pain           PT Goals (current goals can now be found in the care plan section) Acute Rehab PT Goals Patient Stated Goal: " I want to be able to walk again." Progress towards PT goals: Progressing toward goals    Frequency    Min 2X/week      PT Plan Current plan remains appropriate       AM-PAC PT "6 Clicks" Mobility   Outcome Measure  Help needed turning from your back to your side while in a flat bed without using bedrails?: A Little Help needed moving from lying on your back to sitting on the side of a flat bed without using bedrails?: A Little Help needed moving to and from a bed to a chair (including a wheelchair)?: A Lot Help needed standing up from a chair using your arms (e.g., wheelchair or bedside chair)?:  A Lot Help needed to walk in hospital room?: A Lot Help needed climbing 3-5 steps with a railing? : A Lot 6 Click Score: 14    End of Session Equipment Utilized During Treatment: Gait belt Activity Tolerance: Patient limited by lethargy;Patient limited by fatigue Patient left: in chair;with call bell/phone within reach;with chair alarm set Nurse Communication: Mobility status PT Visit Diagnosis: Unsteadiness on feet (R26.81);Repeated falls (R29.6);Muscle weakness (generalized) (M62.81);History of falling (Z91.81);Difficulty in walking, not elsewhere classified (R26.2)     Time: 9163-8466 PT Time Calculation (min) (ACUTE ONLY): 24 min  Charges:  $Gait Training: 8-22 mins $Therapeutic Activity: 8-22 mins                     {  PTA 09/07/20, 2:07 PM

## 2020-09-07 NOTE — ED Notes (Signed)
Dr Tamala Julian is aware of CIWA of 8 and gave VO to give Ativan 27m prior to dosage timing ordered

## 2020-09-08 NOTE — ED Notes (Signed)
Patient set up for lunch. Tray opened; patient asked that his mustard and ranch packets be opened; per request this was done. Patient denied the need for help eating and is witnessed to pick up his own sandwich. Meds given with the meal and patient tolerated well.

## 2020-09-08 NOTE — ED Notes (Signed)
Administered medication to pt pt swallowed medications with no difficulty

## 2020-09-08 NOTE — ED Notes (Signed)
Patient asleep in the recliner. Easily arouses to movement in the room

## 2020-09-08 NOTE — ED Notes (Signed)
EDP gave permission to give HS medications early @2030 

## 2020-09-08 NOTE — ED Notes (Signed)
Provided pt with some water

## 2020-09-08 NOTE — ED Notes (Signed)
[name] vol pending placement

## 2020-09-08 NOTE — ED Notes (Signed)
Patient ate his breakfast in total.

## 2020-09-08 NOTE — ED Notes (Signed)
Pt communicated to this tech that he doesn't want to go to a facility, he wants to go home instead and wanted to talk to the person in charge of making that decision for him. This tech informed the nurse Ariel, and social work to be paged.

## 2020-09-08 NOTE — ED Notes (Signed)
Pt given dinner tray and a coke.

## 2020-09-08 NOTE — ED Notes (Signed)
This Cabin crew assisted pt down to his bed.

## 2020-09-08 NOTE — ED Notes (Signed)
Breakfast given with apple juice

## 2020-09-08 NOTE — ED Notes (Signed)
Assisted pt with urinal: 100 ml

## 2020-09-08 NOTE — ED Notes (Signed)
Pt used urinal

## 2020-09-08 NOTE — ED Notes (Signed)
Patient asleep. Will administer meds when lunch arrives.

## 2020-09-08 NOTE — ED Notes (Signed)
Patient screaming for police. Writer went to see what patient wanted. Patient states he needs to see police. Pt states he wants to go home. Writer advised patient he needs to see social worker to set up transportation. Patient threw his cup of water in room and cussing at [employer].

## 2020-09-08 NOTE — TOC Transition Note (Signed)
Transition of Care [facility]) - CM/SW Discharge Note   Patient Details  Name: Obert Espindola. MRN: [id number] Date of Birth: 09/26/1951  [facility] CM/SW Contact:  Berenice Bouton, LCSW Phone Number: 09/08/2020, 11:26 AM   Clinical Narrative:   SNF placement pending bed offer.   Historical: [name] a 69 year old male who presented [facility] on 10/16 [facility] after fall at home. Hx of ETOH abuse, and frequent visit to [place]. TOC team made APS report.    [place] [phone] following   Final next level of care: Home/Self Care Barriers to Discharge: SNF Pending bed offer, ED SNF auth   Patient Goals and CMS Choice Patient states their goals for this hospitalization and ongoing recovery are:: "I want to go back home I left my garage door open." CMS Medicare.gov Compare Post Acute Care list provided to:: Patient Choice offered to / list presented to : Patient  Discharge Placement                       Discharge Plan and Services In-house Referral: Clinical Social Work                                   Social Determinants of Health (B and E) Interventions     Readmission Risk Interventions Readmission Risk Prevention Plan 08/23/2020 08/19/2020 08/14/2020  Transportation Screening Complete Complete Complete  PCP or Specialist Appt within 3-5 Days - - -  HRI or [place] - - -  Palliative Care Screening - - -  Medication Review (RN Care Manager) Complete Complete -  PCP or Specialist appointment within 3-5 days of discharge (No Data) Complete Complete  HRI or Home Care Consult Complete - Complete  SW Recovery Care/Counseling Consult - Complete Complete  Palliative Care Screening - Not Applicable Not [place] Not Applicable - Patient Refused  Some recent data might be hidden

## 2020-09-08 NOTE — ED Notes (Signed)
Assisted pt to recliner, pt disoriented to place pt reports he does not remember when he came to ER, RN explained pt is calm

## 2020-09-09 LAB — COMPREHENSIVE METABOLIC PANEL
ALT: 48 U/L — ABNORMAL HIGH (ref 0–44)
AST: 44 U/L — ABNORMAL HIGH (ref 15–41)
Albumin: 4.4 g/dL (ref 3.5–5.0)
Alkaline Phosphatase: 103 U/L (ref 38–126)
Anion gap: 11 (ref 5–15)
BUN: 16 mg/dL (ref 8–23)
CO2: 28 mmol/L (ref 22–32)
Calcium: 9.5 mg/dL (ref 8.9–10.3)
Chloride: 104 mmol/L (ref 98–111)
Creatinine, Ser: 0.68 mg/dL (ref 0.61–1.24)
GFR, Estimated: >60 mL/min (ref >60)
Glucose, Bld: 104 mg/dL — ABNORMAL HIGH (ref 70–99)
Potassium: 3.7 mmol/L (ref 3.5–5.1)
Sodium: 143 mmol/L (ref 135–145)
Total Bilirubin: 0.9 mg/dL (ref 0.3–1.2)
Total Protein: 7.8 g/dL (ref 6.5–8.1)

## 2020-09-09 LAB — URINALYSIS, COMPLETE (UACMP) WITH MICROSCOPIC
Bacteria, UA: NONE SEEN
Bilirubin Urine: NEGATIVE
Glucose, UA: NEGATIVE mg/dL
Hgb urine dipstick: NEGATIVE
Ketones, ur: NEGATIVE mg/dL
Leukocytes,Ua: NEGATIVE
Nitrite: NEGATIVE
Protein, ur: 30 mg/dL — AB
Specific Gravity, Urine: 1.033 — ABNORMAL HIGH (ref 1.005–1.030)
pH: 5 (ref 5.0–8.0)

## 2020-09-09 LAB — CBC
HCT: 42.5 % (ref 39.0–52.0)
Hemoglobin: 14 g/dL (ref 13.0–17.0)
MCH: 29.7 pg (ref 26.0–34.0)
MCHC: 32.9 g/dL (ref 30.0–36.0)
MCV: 90 fL (ref 80.0–100.0)
Platelets: 116 10*3/uL — ABNORMAL LOW (ref 150–400)
RBC: 4.72 MIL/uL (ref 4.22–5.81)
RDW: 13.3 % (ref 11.5–15.5)
WBC: 5.2 10*3/uL (ref 4.0–10.5)
nRBC: 0 % (ref 0.0–0.2)

## 2020-09-09 MED ORDER — LORAZEPAM 2 MG PO TABS
2.0000 mg | ORAL_TABLET | Freq: Once | ORAL | Status: AC
  Administered 2020-09-09: 2 mg via ORAL
  Filled 2020-09-09: qty 1

## 2020-09-09 NOTE — ED Notes (Signed)
Report to include Situation, Background, Assessment, and Recommendations received from Douglas Peterson. Patient alert and oriented, warm and dry, in no acute distress. Patient denies SI, HI, AVH and pain. Patient made aware of Q15 minute rounds and Engineer, drilling presence for their safety. Patient instructed to come to me with needs or concerns.

## 2020-09-09 NOTE — ED Notes (Signed)
Hourly rounding reveals patient in room. No complaints, stable, in no acute distress. Q15 minute rounds and monitoring via Engineer, drilling to continue.

## 2020-09-09 NOTE — ED Notes (Signed)
Pt given tylenol for headache

## 2020-09-09 NOTE — ED Notes (Signed)
Pt calling out for help from his room.  Went to check on pt and he was sitting in his recliner from the bed on his floor. Stating he "was leaving and he is cold".  Stated he needs to go inside the hospital because hes" freezing and we cant leave him outside".  Explained to pt he is in the hospital and he called me a liar.  Talking erratically about a variety of things that does not make sense.  Pt was given several warm blankets and he threw them back at me stating he needs to "go in".  Pt made comfortable in recliner and will continue 15 minute safety checks.

## 2020-09-09 NOTE — ED Notes (Signed)
Pt cleaned and given new brief. Assisted to recliner and given meal tray.

## 2020-09-09 NOTE — ED Notes (Signed)
Patient refused vitals.

## 2020-09-09 NOTE — ED Notes (Signed)
Pt refusing breakfast

## 2020-09-09 NOTE — ED Notes (Signed)
Pt. Was changed at this time.

## 2020-09-09 NOTE — ED Notes (Signed)
IV flushes hard and does not give blood. Pt straight stuck for blood.

## 2020-09-09 NOTE — ED Notes (Signed)
Snack and beverage given. 

## 2020-09-09 NOTE — ED Notes (Signed)
Pt seems to be more confused than normal. Thinks he's at home and has called his sister to get his keys. MD made aware. See new orders.

## 2020-09-10 NOTE — Progress Notes (Signed)
Physical Therapy Treatment Patient Details Name: Douglas Peterson. MRN: [id number] DOB: 1951-09-04 Today's Date: 09/10/2020    History of Present Illness Douglas Peterson. is a 34HDQ who comes to [facility] on 10/16 after fall at home. Pt found by EMS in his car with blood on back of head, pt reportedly fell in garage earlier in day, hitting head and buttocks. 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 Pt recently DC back to home on 10/12 after SIRS admission.    PT Comments    Pt ready for session.  On low mattress on floor.  RN reports due to fall risk.  He requires mod a x 2 to stand from floor.  Ambulates with min/mod a x 1 and SPC.  Assisted with shower.  He is then able to walk 100' with same assist and remains in recliner for breakfast after session.  Tech aware.  Pt continues to require increased assist from baseline and needs hands on assist at all times given balance and inability to self correct at this time.  SNF remains appropriate for discharge.   Follow Up Recommendations  SNF     Equipment Recommendations  None recommended by PT    Recommendations for Other Services       Precautions / Restrictions Precautions Precautions: Fall Precaution Comments: 74week old, clavicular fracture, ortho rec pending Required Braces or Orthoses:  (no sling needed as of 10/21 per Dr. Mack Guise) Restrictions Weight Bearing Restrictions: No RUE Weight Bearing: Weight bearing as tolerated Other Position/Activity Restrictions: Rt clavicle fx on 06/05/20    Mobility  Bed Mobility Overal bed mobility: Needs Assistance Bed Mobility: Supine to Sit     Supine to sit: Mod assist        Transfers Overall transfer level: Needs assistance Equipment used: Straight cane Transfers: Sit to/from Stand Sit to Stand: Mod assist            Ambulation/Gait Ambulation/Gait assistance: Mod assist;Min assist Gait  Distance (Feet): 100 Feet Assistive device: Straight cane Gait Pattern/deviations: Shuffle;Staggering left;Staggering right Gait velocity: decreased   General Gait Details: balance poor requring min a hands on at all times, occasionally mod a for post LOB's   Stairs             Wheelchair Mobility    Modified Rankin (Stroke Patients Only)       Balance Overall balance assessment: Needs assistance;History of Falls Sitting-balance support: Single extremity supported;Feet supported Sitting balance-Leahy Scale: Fair     Standing balance support: Single extremity supported;During functional activity Standing balance-Leahy Scale: Poor Standing balance comment: poor balance in standing even with +1 UE support. RUE affected by previous stroke.                             Cognition Arousal/Alertness: Awake/alert Behavior During Therapy: WFL for tasks assessed/performed Overall Cognitive Status: Within Functional Limits for tasks assessed                                        Exercises Other Exercises Other Exercises: assisted tech with shower and gait to/from    General Comments        Pertinent Vitals/Pain Pain Assessment: No/denies pain    Home Living  Prior Function            PT Goals (current goals can now be found in the care plan section) Progress towards PT goals: Progressing toward goals    Frequency    Min 2X/week      PT Plan Current plan remains appropriate    Co-evaluation              AM-PAC PT "6 Clicks" Mobility   Outcome Measure  Help needed turning from your back to your side while in a flat bed without using bedrails?: A Little Help needed moving from lying on your back to sitting on the side of a flat bed without using bedrails?: A Little Help needed moving to and from a bed to a chair (including a wheelchair)?: A Lot Help needed standing up from a chair using your  arms (e.g., wheelchair or bedside chair)?: A Lot Help needed to walk in hospital room?: A Lot Help needed climbing 3-5 steps with a railing? : A Lot 6 Click Score: 14    End of Session Equipment Utilized During Treatment: Gait belt Activity Tolerance: Patient tolerated treatment well Patient left: in chair;with call bell/phone within reach Nurse Communication: Mobility status       Time: 6004-5997 PT Time Calculation (min) (ACUTE ONLY): 38 min  Charges:  $Gait Training: 8-22 mins $Therapeutic Activity: 23-37 mins                    Chesley Noon, PTA 09/10/20, 11:39 AM

## 2020-09-10 NOTE — ED Notes (Signed)
Pt took a shower with the assistance of this tech and physical therapist. Pt bed linen was changed. Pt tolerated shower very well. Pt is ambulating with PT at this time.

## 2020-09-10 NOTE — ED Notes (Signed)
Hourly rounding reveals patient in room. No complaints, stable, in no acute distress. Q15 minute rounds and monitoring via Engineer, drilling to continue.

## 2020-09-10 NOTE — ED Notes (Signed)
Pt asleep, breakfast tray placed on chair in rm.

## 2020-09-10 NOTE — TOC Progression Note (Signed)
Transition of Care (TOC) - Progression Note    Patient Details  Name: Douglas Peterson. MRN: [id number] Date of Birth: 20-May-1951  Transition of Care [facility]) CM/SW [place], [place] Phone Number:  [phone] 09/10/2020, 3:13 PM  Clinical Narrative:     CSW went to see patient for update on placement. Patient was concerned about where his care was and could not remember where he left it. ED RN reached out to CSW to let me know sheriff's office served the patient guardianship papers. Patient stated he wanted to go home.  CSW spoke with EDP and inquired about safe d/c for patient.  [name] still unable to walk without assist and is considered an unsafe discharged.    Expected Discharge Plan: Skilled Nursing Facility Barriers to Discharge: SNF Pending bed offer, ED SNF auth  Expected Discharge Plan and Services Expected Discharge Plan: [place] In-house Referral: Clinical Social Work     Living arrangements for the past 2 months: Single Family Home                                       Social Determinants of Health (SDOH) Interventions    Readmission Risk Interventions Readmission Risk Prevention Plan 08/23/2020 08/19/2020 08/14/2020  Transportation Screening Complete Complete Complete  PCP or Specialist Appt within 3-5 Days - - -  HRI or [place] - - -  Palliative Care Screening - - -  Medication Review (RN Care Manager) Complete Complete -  PCP or Specialist appointment within 3-5 days of discharge (No Data) Complete Complete  HRI or Home Care Consult Complete - Complete  SW Recovery Care/Counseling Consult - Complete Complete  Palliative Care Screening - Not Applicable Not [place] Not Applicable - Patient Refused  Some recent data might be hidden

## 2020-09-10 NOTE — ED Notes (Signed)
Changed pt and changed bedding. Gave food tray with sprite.

## 2020-09-11 NOTE — ED Notes (Signed)
Pt given meal tray.

## 2020-09-11 NOTE — ED Provider Notes (Signed)
Emergency Medicine Observation Re-evaluation Note  Lyman Balingit. is a 69 y.o. male, seen on rounds today.  Pt initially presented to the ED for complaints of Head Injury Currently, the patient is resting.  Physical Exam  BP (!) 126/106 (BP Location: Right Arm)   Pulse 74   Temp (!) 97.5 F (36.4 C) (Oral)   Resp 20   Ht 1.626 m (5' 4" )   Wt 54.5 kg   SpO2 97%   BMI 20.62 kg/m  Physical Exam Gen:  No acute distress Resp:  Breathing easily and comfortably, no accessory muscle usage Neuro:  no acute gross focal neuro deficits Psych:  Resting currently, minimally cooperative when awake  ED Course / MDM     I have reviewed the labs performed to date as well as medications administered while in observation.  Recent changes in the last 24 hours include no significant changes.  Plan  Current plan is for placement either by social work or psychiatry. [name] not under full IVC at this time.   Hinda Kehr, MD 09/11/20 [phone]

## 2020-09-11 NOTE — ED Notes (Signed)
Pt updated on plan of placement. Pt reaffirms that he wants to go home after discharge. Pt states he is done drinking and can take care of himself

## 2020-09-11 NOTE — ED Notes (Signed)
VOLUNTARY/pending placemeny

## 2020-09-11 NOTE — ED Notes (Signed)
Pt denies SI/HI/AH/VH. Pt calm, polite, cooperative. Pt expresses desire to leave, states he just wants to go home. Pt anxious that someone is going through his house/belongings. Pt states he thinks he is able to take care of himself at home and wants to go back, does not seem to understand physical limitations. Does acknowledge ETOH problem, but speaks about it in the past tense.   Pt requests an update on discharge/transfer plan. Will assess and inform

## 2020-09-11 NOTE — ED Notes (Signed)
Pt is very upset this morning. He requested to be moved to his bed. He is resting watching TV at this time,but requested something to calm him down.

## 2020-09-12 NOTE — ED Notes (Signed)
Food tray was given with juice.

## 2020-09-12 NOTE — ED Notes (Signed)
[name] phone charging at [employer].

## 2020-09-12 NOTE — ED Notes (Signed)
[name] resting comfortably in recliner and watching tv. No other needs found at this moment.

## 2020-09-12 NOTE — ED Notes (Signed)
Pt given meal tray and ate 90%

## 2020-09-12 NOTE — TOC Progression Note (Addendum)
Transition of Care (TOC) - Progression Note    Patient Details  Name: Douglas Peterson. MRN: [id number] Date of Birth: [birth year]  Transition of Care [facility]) CM/SW [place], [place] Phone Number: [phone] 09/12/2020, 8:06 AM  Clinical Narrative:     Patient has bed offer at [facility] pending insurance authorization.  CSW left message for Otila Kluver at [facility].  Expected Discharge Plan: Skilled Nursing Facility Barriers to Discharge: SNF Pending bed offer, ED SNF auth  Expected Discharge Plan and Services Expected Discharge Plan: [place] In-house Referral: Clinical Social Work     Living arrangements for the past 2 months: Single Family Home                                       Social Determinants of Health (SDOH) Interventions    Readmission Risk Interventions Readmission Risk Prevention Plan 08/23/2020 08/19/2020 08/14/2020  Transportation Screening Complete Complete Complete  PCP or Specialist Appt within 3-5 Days - - -  HRI or [place] - - -  Palliative Care Screening - - -  Medication Review (RN Care Manager) Complete Complete -  PCP or Specialist appointment within 3-5 days of discharge (No Data) Complete Complete  HRI or Home Care Consult Complete - Complete  SW Recovery Care/Counseling Consult - Complete Complete  Palliative Care Screening - Not Applicable Not [place] Not Applicable - Patient Refused  Some recent data might be hidden

## 2020-09-12 NOTE — ED Notes (Signed)
Pt given phone to speak with guardian ad litem

## 2020-09-12 NOTE — ED Notes (Signed)
Pt becomes very upset that we haven't allowed him his personal cell phone and shoes. Explained to pt the rules of the area he is in.

## 2020-09-12 NOTE — ED Notes (Signed)
Pt given meal tray and ate 100%

## 2020-09-12 NOTE — ED Notes (Signed)
PT at bedside.

## 2020-09-12 NOTE — ED Notes (Signed)
Pt given meal tray and ate 80%

## 2020-09-12 NOTE — ED Notes (Signed)
VOL/  PENDING  PLACEMENT

## 2020-09-12 NOTE — ED Notes (Signed)
Pt states he thinks the information that a family member provided for one of the forms is incorrect. Pt unable to state what is incorrect or what form, says "it went to the courthouse or something."  Pt given warm blanket per request

## 2020-09-12 NOTE — ED Notes (Signed)
Attempted to put patient on fall alarm due to pt's high fall risk and refusal to stay sitting. Pt begins yelling about not wearing the fall alarm and refuses. Pt states "i'm not going to wear something that makes me call for help each time I need to get up. If I fall, it's my own fault." Pt states that he uses a cane normally at home and this RN offers a walker. Pt refuses walker.

## 2020-09-12 NOTE — Progress Notes (Signed)
Physical Therapy Treatment Patient Details Name: Douglas Peterson. MRN: [id number] DOB: 1950/11/05 Today's Date: 09/12/2020    History of Present Illness [name]. is a 77OEU who comes to [facility] on 10/16 after fall at home. Pt found by EMS in his car with blood on back of head, pt reportedly fell in garage earlier in day, hitting head and buttocks. 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 Pt recently DC back to home on 10/12 after SIRS admission.    PT Comments    Pt seated in recliner chair upon arrival to room and as clinician entering the door, pt stood from recliner chair with supervision only with no external assistance required. Verbal cues to sit to allow nurse to tape pt's shoe as sole noted to be falling away from body of the shoe. Once sole secured with tape, pt stood with supervision and ambulated 1000 feet around emergency department with handheld min A for steadying and safety. Chair follow provided for safety. Pt noted to stagger approx. 800' into distance and seated rest break required. After short break, pt finished last 200 feet with good energy. Pt remained very pleasant throughout session and with intelligible conversation throughout. Pt with decreased safety awareness with attempts to ambulate prior to fixing shoe/sole and performing transfers impulsively. Will continue to follow and progress as tolerated.     Follow Up Recommendations  SNF     Equipment Recommendations  None recommended by PT    Recommendations for Other Services       Precautions / Restrictions Precautions Precautions: Fall Precaution Comments: 86week old, clavicular fracture, ortho rec pending Restrictions Weight Bearing Restrictions: Yes RUE Weight Bearing: Weight bearing as tolerated Other Position/Activity Restrictions: Rt clavicle fx on 06/05/20    Mobility  Bed Mobility               General bed  mobility comments: pt in recliner chair upon arrival so bed mobility not assessed'  Transfers Overall transfer level: Needs assistance Equipment used: None Transfers: Sit to/from Stand Sit to Stand: Supervision         General transfer comment: supervision for sit <> stand transfer as pt stood prior to clinician's readiness  Ambulation/Gait Ambulation/Gait assistance: Min Web designer (Feet): 1000 Feet Assistive device: 1 person hand held assist Gait Pattern/deviations: Shuffle;Staggering right Gait velocity: decreased   General Gait Details: handheld assist on L providing min A for balance with gait belt support   Stairs             Wheelchair Mobility    Modified Rankin (Stroke Patients Only)       Balance Overall balance assessment: Needs assistance;History of Falls Sitting-balance support: Single extremity supported;Feet supported Sitting balance-Leahy Scale: Fair Sitting balance - Comments: pt noted to be resting back on recliner chair with LUE support on armrest   Standing balance support: Single extremity supported;During functional activity Standing balance-Leahy Scale: Fair Standing balance comment: fair dynamic balance with L HHA/min A for steadying and safety                            Cognition Arousal/Alertness: Awake/alert Behavior During Therapy: WFL for tasks assessed/performed Overall Cognitive Status: Within Functional Limits for tasks assessed  General Comments: Pt with intelligible and appropriate conversation today.      Exercises      General Comments        Pertinent Vitals/Pain Pain Assessment: No/denies pain    Home Living                      Prior Function            PT Goals (current goals can now be found in the care plan section) Acute Rehab PT Goals Patient Stated Goal: " I want to be able to walk again." PT Goal Formulation: With  patient Time For Goal Achievement: 09/19/20 Potential to Achieve Goals: Good Progress towards PT goals: Progressing toward goals    Frequency    Min 2X/week      PT Plan Current plan remains appropriate    Co-evaluation              AM-PAC PT "6 Clicks" Mobility   Outcome Measure  Help needed turning from your back to your side while in a flat bed without using bedrails?: A Little Help needed moving from lying on your back to sitting on the side of a flat bed without using bedrails?: A Little Help needed moving to and from a bed to a chair (including a wheelchair)?: A Little Help needed standing up from a chair using your arms (e.g., wheelchair or bedside chair)?: A Little Help needed to walk in hospital room?: A Little Help needed climbing 3-5 steps with a railing? : A Lot 6 Click Score: 17    End of Session Equipment Utilized During Treatment: Gait belt Activity Tolerance: Patient tolerated treatment well Patient left: in chair;with call bell/phone within reach Nurse Communication: Mobility status PT Visit Diagnosis: Unsteadiness on feet (R26.81);Repeated falls (R29.6);Muscle weakness (generalized) (M62.81);History of falling (Z91.81);Difficulty in walking, not elsewhere classified (R26.2)     Time: 7290-2111 PT Time Calculation (min) (ACUTE ONLY): 17 min  Charges:                        Vale Haven, SPT   Vale Haven 09/12/2020, 3:42 PM

## 2020-09-12 NOTE — ED Provider Notes (Signed)
Emergency Medicine Observation Re-evaluation Note  Douglas Peterson. is a 69 y.o. male, seen on rounds today.  Pt initially presented to the ED for complaints of Head Injury   Physical Exam  BP (!) 146/87   Pulse 62   Temp 98.2 F (36.8 C) (Oral)   Resp 16   Ht 5' 4"  (1.626 m)   Wt 54.5 kg   SpO2 97%   BMI 20.62 kg/m  Physical Exam General: Patient sleeping comfortably in bed Lungs: Patient in no respiratory distress and has no retractions Psych: Patient currently not agitated  ED Course / MDM  EKG:EKG Interpretation  Date/Time:  Tuesday September 04 2020 19:58:06 EDT Ventricular Rate:  102 PR Interval:  120 QRS Duration: 76 QT Interval:  336 QTC Calculation: 437 R Axis:   44 Text Interpretation: Sinus tachycardia Otherwise normal ECG Confirmed by UNCONFIRMED, DOCTOR ([id number]), editor Mel Almond, Tammy [phone]) on 09/07/2020 11:19:43 AM      Plan  [name] currently awaiting social work placement.   Nena Polio, MD 09/12/20 [phone]

## 2020-09-12 NOTE — ED Notes (Signed)
Pt remains with bedside commode and food tray at bedside due to being a social work placement.

## 2020-09-12 NOTE — ED Notes (Signed)
Per Marya Amsler, RN, pt OK to have cell phone and shoes due to being psychiatrically cleared. Pt has two black tennis shoes, one black cell phone, and one "fast phone charger" wireless. Pt belongings are in blue draw string bag.

## 2020-09-12 NOTE — ED Notes (Signed)
Pt cleansed of urine and given new scrubs.

## 2020-09-12 NOTE — ED Notes (Signed)
Pt soiled pants while accessing bedside commode. Pt cleansed/changed

## 2020-09-12 NOTE — ED Notes (Signed)
Pt calling out for assistance, reports soiled brief. Upon assessment, brief is dry. Urinal emptied

## 2020-09-12 NOTE — ED Notes (Signed)
Pt attempted to walk to bedside commode but urinated on himself. Cleaned with [place], NT.

## 2020-09-13 NOTE — ED Notes (Signed)
Pt stood by self at bedside to use urinal and laid self back in bed, resting comfortably.

## 2020-09-13 NOTE — TOC Transition Note (Signed)
Transition of Care (TOC) - Progression Note    Patient Details  Name: Douglas Peterson. MRN: [id number] Date of Birth: [birth year]  Transition of Care [facility]) CM/SW [place], [place] Phone Number: [phone] 09/13/2020, 11:48 AM  Clinical Narrative:     CSW spoke with patient and updated him on bed offer at [facility]  Patient stated he wanted to go home and did not need a SNF placement.  Patient was able to stand up and ambulate to commode without assistance today, but will still need a cane or walker to get around the home.  Patient stated he has a cane and walker at home.  CSW updated EDP.  CSW contact [facility] and they will transport the patient at 1:00PM.  CSW updated EDP/ED Staff [facility] consult complete.  Expected Discharge Plan: Skilled Nursing Facility Barriers to Discharge: SNF Pending bed offer, ED SNF auth  Expected Discharge Plan and Services Expected Discharge Plan: [place] In-house Referral: Clinical Social Work     Living arrangements for the past 2 months: Single Family Home                                       Social Determinants of Health (SDOH) Interventions    Readmission Risk Interventions Readmission Risk Prevention Plan 08/23/2020 08/19/2020 08/14/2020  Transportation Screening Complete Complete Complete  PCP or Specialist Appt within 3-5 Days - - -  HRI or [place] - - -  Palliative Care Screening - - -  Medication Review (RN Care Manager) Complete Complete -  PCP or Specialist appointment within 3-5 days of discharge (No Data) Complete Complete  HRI or Home Care Consult Complete - Complete  SW Recovery Care/Counseling Consult - Complete Complete  Palliative Care Screening - Not Applicable Not [place] Not Applicable - Patient Refused  Some recent data might be hidden

## 2020-09-13 NOTE — ED Notes (Signed)
Pt given breakfast tray

## 2020-09-13 NOTE — ED Provider Notes (Signed)
Emergency Medicine Observation Re-evaluation Note  Akshar Starnes. is a 69 y.o. male, seen on rounds today.  Pt initially presented to the ED for complaints of Head Injury Currently, the patient is resting, voices no medical complaints.  Physical Exam  BP 136/84 (BP Location: Left Arm)   Pulse 66   Temp 97.9 F (36.6 C) (Oral)   Resp 18   Ht 5' 4"  (1.626 m)   Wt 54.5 kg   SpO2 96%   BMI 20.62 kg/m  Physical Exam General: Resting in no acute distress Cardiac: No cyanosis Lungs: Equal rise and fall Psych: Not agitated  ED Course / MDM  EKG:EKG Interpretation  Date/Time:  Tuesday September 04 2020 19:58:06 EDT Ventricular Rate:  102 PR Interval:  120 QRS Duration: 76 QT Interval:  336 QTC Calculation: 437 R Axis:   44 Text Interpretation: Sinus tachycardia Otherwise normal ECG Confirmed by UNCONFIRMED, DOCTOR ([id number]), editor Mel Almond, Tammy [phone]) on 09/07/2020 11:19:43 AM    I have reviewed the labs performed to date as well as medications administered while in observation.  Recent changes in the last 24 hours include no events overnight.  Plan  Current plan is for SNF placement. [name] not under full IVC at this time.   Paulette Blanch, MD 09/13/20 [phone]

## 2020-09-13 NOTE — ED Provider Notes (Signed)
Patient offered placement and a place was found for him but he refuses he wants to go home.  Adult Protective Services will follow him and his sister has guardian papers on him.  I will attempt to get PT to consult and provide him with home PT but if that does not work we will discharge him.   Nena Polio, MD 09/13/20 1135

## 2020-09-13 NOTE — ED Notes (Signed)
Entered room, introduced ourselves, trash was removed from room, patient was given fresh water and patient's bed was made.

## 2020-09-19 ENCOUNTER — Other Ambulatory Visit: Payer: Self-pay

## 2020-09-19 ENCOUNTER — Emergency Department: Payer: Medicare HMO

## 2020-09-19 ENCOUNTER — Emergency Department
Admission: EM | Admit: 2020-09-19 | Discharge: 2020-11-03 | Disposition: A | Discharge status: Home / Self Care | Payer: Medicare HMO | Attending: Emergency Medicine | Admitting: Emergency Medicine

## 2020-09-19 DIAGNOSIS — Z79899 Other long term (current) drug therapy: Secondary | ICD-10-CM | POA: Diagnosis not present

## 2020-09-19 DIAGNOSIS — R519 Headache, unspecified: Secondary | ICD-10-CM | POA: Insufficient documentation

## 2020-09-19 DIAGNOSIS — Z20822 Contact with and (suspected) exposure to covid-19: Secondary | ICD-10-CM | POA: Insufficient documentation

## 2020-09-19 DIAGNOSIS — F1020 Alcohol dependence, uncomplicated: Secondary | ICD-10-CM | POA: Diagnosis not present

## 2020-09-19 DIAGNOSIS — Z7982 Long term (current) use of aspirin: Secondary | ICD-10-CM | POA: Diagnosis not present

## 2020-09-19 DIAGNOSIS — F10920 Alcohol use, unspecified with intoxication, uncomplicated: Secondary | ICD-10-CM

## 2020-09-19 DIAGNOSIS — R2681 Unsteadiness on feet: Secondary | ICD-10-CM

## 2020-09-19 DIAGNOSIS — Z87891 Personal history of nicotine dependence: Secondary | ICD-10-CM | POA: Diagnosis not present

## 2020-09-19 DIAGNOSIS — Z9181 History of falling: Secondary | ICD-10-CM | POA: Diagnosis not present

## 2020-09-19 DIAGNOSIS — I10 Essential (primary) hypertension: Secondary | ICD-10-CM | POA: Diagnosis not present

## 2020-09-19 DIAGNOSIS — Z111 Encounter for screening for respiratory tuberculosis: Secondary | ICD-10-CM

## 2020-09-19 LAB — URINALYSIS, COMPLETE (UACMP) WITH MICROSCOPIC
Bacteria, UA: NONE SEEN
Bilirubin Urine: NEGATIVE
Glucose, UA: NEGATIVE mg/dL
Ketones, ur: NEGATIVE mg/dL
Leukocytes,Ua: NEGATIVE
Nitrite: NEGATIVE
Protein, ur: NEGATIVE mg/dL
Specific Gravity, Urine: 1.002 — ABNORMAL LOW (ref 1.005–1.030)
Squamous Epithelial / HPF: NONE SEEN (ref 0–5)
pH: 5 (ref 5.0–8.0)

## 2020-09-19 LAB — CBC WITH DIFFERENTIAL/PLATELET
Abs Immature Granulocytes: 0.03 10*3/uL (ref 0.00–0.07)
Basophils Absolute: 0.1 10*3/uL (ref 0.0–0.1)
Basophils Relative: 1 %
Eosinophils Absolute: 0.2 10*3/uL (ref 0.0–0.5)
Eosinophils Relative: 1 %
HCT: 50.3 % (ref 39.0–52.0)
Hemoglobin: 16.4 g/dL (ref 13.0–17.0)
Immature Granulocytes: 0 %
Lymphocytes Relative: 64 %
Lymphs Abs: 11.2 10*3/uL — ABNORMAL HIGH (ref 0.7–4.0)
MCH: 29.8 pg (ref 26.0–34.0)
MCHC: 32.6 g/dL (ref 30.0–36.0)
MCV: 91.5 fL (ref 80.0–100.0)
Monocytes Absolute: 0.6 10*3/uL (ref 0.1–1.0)
Monocytes Relative: 3 %
Neutro Abs: 5.4 10*3/uL (ref 1.7–7.7)
Neutrophils Relative %: 31 %
Platelets: 425 10*3/uL — ABNORMAL HIGH (ref 150–400)
RBC: 5.5 MIL/uL (ref 4.22–5.81)
RDW: 14.1 % (ref 11.5–15.5)
Smear Review: NORMAL
WBC: 17.5 10*3/uL — ABNORMAL HIGH (ref 4.0–10.5)
nRBC: 0 % (ref 0.0–0.2)

## 2020-09-19 LAB — COMPREHENSIVE METABOLIC PANEL
ALT: 36 U/L (ref 0–44)
AST: 54 U/L — ABNORMAL HIGH (ref 15–41)
Albumin: 4.5 g/dL (ref 3.5–5.0)
Alkaline Phosphatase: 95 U/L (ref 38–126)
Anion gap: 16 — ABNORMAL HIGH (ref 5–15)
BUN: <5 mg/dL — ABNORMAL LOW (ref 8–23)
CO2: 25 mmol/L (ref 22–32)
Calcium: 8.9 mg/dL (ref 8.9–10.3)
Chloride: 93 mmol/L — ABNORMAL LOW (ref 98–111)
Creatinine, Ser: 0.61 mg/dL (ref 0.61–1.24)
GFR, Estimated: >60 mL/min (ref >60)
Glucose, Bld: 96 mg/dL (ref 70–99)
Potassium: 4.6 mmol/L (ref 3.5–5.1)
Sodium: 134 mmol/L — ABNORMAL LOW (ref 135–145)
Total Bilirubin: 1 mg/dL (ref 0.3–1.2)
Total Protein: 8.1 g/dL (ref 6.5–8.1)

## 2020-09-19 LAB — ETHANOL: Alcohol, Ethyl (B): 290 mg/dL — ABNORMAL HIGH (ref <10)

## 2020-09-19 LAB — CBG MONITORING, ED: Glucose-Capillary: 113 mg/dL — ABNORMAL HIGH (ref 70–99)

## 2020-09-19 MED ORDER — THIAMINE HCL 100 MG/ML IJ SOLN
100.0000 mg | Freq: Every day | INTRAMUSCULAR | Status: DC
  Filled 2020-09-19 (×2): qty 2

## 2020-09-19 MED ORDER — LACTATED RINGERS IV BOLUS
1000.0000 mL | Freq: Once | INTRAVENOUS | Status: AC
  Administered 2020-09-19: 1000 mL via INTRAVENOUS

## 2020-09-19 MED ORDER — LORAZEPAM 2 MG PO TABS
0.0000 mg | ORAL_TABLET | Freq: Four times a day (QID) | ORAL | Status: AC
  Administered 2020-09-19 – 2020-09-21 (×7): 2 mg via ORAL
  Filled 2020-09-19 (×8): qty 1

## 2020-09-19 MED ORDER — LORAZEPAM 2 MG/ML IJ SOLN: 0.0000 mg | Freq: Four times a day (QID) | INTRAMUSCULAR | Status: AC

## 2020-09-19 MED ORDER — LORAZEPAM 2 MG/ML IJ SOLN
0.0000 mg | Freq: Two times a day (BID) | INTRAMUSCULAR | Status: AC
  Administered 2020-09-22 – 2020-09-23 (×2): 1 mg via INTRAVENOUS
  Filled 2020-09-19 (×2): qty 1

## 2020-09-19 MED ORDER — LORAZEPAM 2 MG PO TABS
0.0000 mg | ORAL_TABLET | Freq: Two times a day (BID) | ORAL | Status: AC
  Administered 2020-09-22: 2 mg via ORAL
  Filled 2020-09-19: qty 1

## 2020-09-19 MED ORDER — THIAMINE HCL 100 MG PO TABS
100.0000 mg | ORAL_TABLET | Freq: Every day | ORAL | Status: DC
  Administered 2020-09-20 – 2020-11-02 (×44): 100 mg via ORAL
  Filled 2020-09-19 (×44): qty 1

## 2020-09-19 NOTE — ED Notes (Signed)
This nurse attempted to ambulate pt in hall at this time with walker. Pt had previously transported to Xray in wheelchair and was able to stand, pivot, and sit for transport and return. At this time, Pt appears to be more unsteady than when he went to Xray, Pt required assistance to get to seated position and was unable to sit up on own without falling backward onto bed. Pt then states, "I can't do this now" pt then lays down on bed and grabs blankets to cover up, while doing so he states, "there is no way I can get up without falling." Pt covers head up with blanket at this time.

## 2020-09-19 NOTE — ED Notes (Signed)
Pt now in ED psych hold.

## 2020-09-19 NOTE — ED Notes (Signed)
Pt at Xray at this time
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
## 2020-09-19 NOTE — ED Notes (Signed)
Gave patient [place] tray and gingerale.AS

## 2020-09-19 NOTE — ED Notes (Signed)
Pt to xray

## 2020-09-19 NOTE — ED Notes (Signed)
Hourly rounding completed at this time, patient currently awake in hallway bed. No complaints, stable, and in no acute distress. Q15 minute rounds and monitoring via Engineer, drilling to continue.

## 2020-09-19 NOTE — ED Notes (Signed)
Lab called to stick patient. This RN attempted x2.

## 2020-09-19 NOTE — ED Notes (Signed)
Report received from [place], RN including Situation, Background, Assessment, and Recommendations. Patient warm and dry, and in no acute distress. Patient denies SI, HI, AVH and pain. Patient made aware of Q15 minute rounds and Engineer, drilling presence for their safety. Patient instructed to come to this nurse with needs or concerns.
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
## 2020-09-19 NOTE — ED Notes (Signed)
Pt to CT at this time.
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
## 2020-09-19 NOTE — ED Notes (Signed)
Pt incontinent at this time, changed into clean brief by Alwyn Pea at this time

## 2020-09-20 LAB — RESPIRATORY PANEL BY RT PCR (FLU A&B, COVID)
Influenza A by PCR: NEGATIVE
Influenza B by PCR: NEGATIVE
SARS Coronavirus 2 by RT PCR: NEGATIVE

## 2020-09-20 MED ORDER — ATENOLOL 25 MG PO TABS
25.0000 mg | ORAL_TABLET | Freq: Two times a day (BID) | ORAL | Status: DC
  Administered 2020-09-20 – 2020-11-02 (×78): 25 mg via ORAL
  Filled 2020-09-20 (×81): qty 1

## 2020-09-20 MED ORDER — BACLOFEN 10 MG PO TABS
10.0000 mg | ORAL_TABLET | Freq: Three times a day (TID) | ORAL | Status: DC
  Administered 2020-09-20 – 2020-11-02 (×126): 10 mg via ORAL
  Filled 2020-09-20 (×151): qty 1

## 2020-09-20 MED ORDER — PAROXETINE HCL 30 MG PO TABS
30.0000 mg | ORAL_TABLET | Freq: Every day | ORAL | Status: DC
  Administered 2020-09-20 – 2020-11-02 (×43): 30 mg via ORAL
  Filled 2020-09-20 (×47): qty 1

## 2020-09-20 NOTE — ED Notes (Signed)
Hourly rounding completed at this time, patient currently asleep in hallway bed. No complaints, stable, and in no acute distress. Q15 minute rounds and monitoring via Engineer, drilling to continue.

## 2020-09-20 NOTE — ED Notes (Signed)
Report to include Situation, Background, Assessment, and Recommendations received from [facility]. Patient alert and oriented, warm and dry, in no acute distress. Patient denies SI, HI, AVH and pain. Patient made aware of Q15 minute rounds and Engineer, drilling presence for their safety. Patient instructed to come to me with needs or concerns.

## 2020-09-20 NOTE — ED Notes (Signed)
Patient PT/OT this morning and did not do well with either, both are recommending rehab for further plan of care. Patient sleeping at present time.

## 2020-09-20 NOTE — TOC Progression Note (Signed)
Transition of Care (TOC) - Progression Note    Patient Details  Name: Nezar Buckles. MRN: [id number] Date of Birth: 1950-11-24  Transition of Care [facility]) CM/SW [place], RN Phone Number: 09/20/2020, 2:20 PM  Clinical Narrative:     Updated APS SW [name], patient is not able to safely discharge today.  APS has asked if patient can stay in hospital as an unsafe discharge until his hearing on Tuesday for competency.  [facility] stating they have several letters stating patient is not competent from the hospital.  If someone does discharge patient before Tuesday they are asking to be called immediately before discharging patient.     Expected Discharge Plan: Skilled Nursing Facility Barriers to Discharge: Continued Medical Work up  Expected Discharge Plan and Services Expected Discharge Plan: [place]   Discharge Planning Services: CM Consult   Living arrangements for the past 2 months: Single Family Home                                       Social Determinants of Health (SDOH) Interventions    Readmission Risk Interventions Readmission Risk Prevention Plan 08/23/2020 08/19/2020 08/14/2020  Transportation Screening Complete Complete Complete  PCP or Specialist Appt within 3-5 Days - - -  HRI or [place] - - -  Palliative Care Screening - - -  Medication Review (RN Care Manager) Complete Complete -  PCP or Specialist appointment within 3-5 days of discharge (No Data) Complete Complete  HRI or Home Care Consult Complete - Complete  SW Recovery Care/Counseling Consult - Complete Complete  Palliative Care Screening - Not Applicable Not [place] Not Applicable - Patient Refused  Some recent data might be hidden

## 2020-09-20 NOTE — ED Notes (Signed)
Hourly rounding reveals patient in room. No complaints, stable, in no acute distress. Q15 minute rounds and monitoring via Engineer, drilling to continue.
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
## 2020-09-20 NOTE — TOC Progression Note (Signed)
Transition of Care (TOC) - Progression Note    Patient Details  Name: Douglas Peterson. MRN: [id number] Date of Birth: Aug 08, 1951  Transition of Care [facility]) CM/SW Contact  Anselm Pancoast, RN Phone Number: 09/20/2020, 10:12 AM  Clinical Narrative:    [facility] supervisor reviewed case as DSS-APS is requesting patient remain in ED until court date Tuesday 09/24/20. DSS will be requesting guardianship for patient as DSS does not feel as though patient is safe at home. TOC will follow for medical status and update [name], APS as information becomes available. At this time patient has not been medically cleared for discharge.         Expected Discharge Plan and Services                                                 Social Determinants of Health (SDOH) Interventions    Readmission Risk Interventions Readmission Risk Prevention Plan 08/23/2020 08/19/2020 08/14/2020  Transportation Screening Complete Complete Complete  PCP or Specialist Appt within 3-5 Days - - -  HRI or [place] - - -  Palliative Care Screening - - -  Medication Review (RN Care Manager) Complete Complete -  PCP or Specialist appointment within 3-5 days of discharge (No Data) Complete Complete  HRI or Home Care Consult Complete - Complete  SW Recovery Care/Counseling Consult - Complete Complete  Palliative Care Screening - Not Applicable Not [place] Not Applicable - Patient Refused  Some recent data might be hidden

## 2020-09-20 NOTE — NC FL2 (Signed)
[place] LEVEL OF CARE SCREENING TOOL     IDENTIFICATION  Patient Name: Douglas Peterson. Birthdate: [birth year] Sex: male Admission Date (Current Location): 09/19/2020  [place] and [place] Number:  Engineering geologist and Address:  [facility], [address], [place], [place] [address]      Provider Number: [id number]  Attending Physician Name and Address:  No att. providers found  Relative Name and Phone Number:  [name] (Daughter) [phone]    Current Level of Care: Hospital Recommended Level of Care: [place] Prior Approval Number:    Date Approved/Denied:   PASRR Number: [id number]  Discharge Plan: SNF    Current Diagnoses: Patient Active Problem List   Diagnosis Date Noted  . Poor social situation 08/18/2020  . Lactic acidosis 08/18/2020  . Cellulitis 08/18/2020  . SIRS (systemic inflammatory response syndrome) ([place]) 08/05/2020  . Right hip pain 08/05/2020  . Hypophosphatemia 08/05/2020  . Closed fracture of distal clavicle_Right 06/05/2020  . GERD (gastroesophageal reflux disease) 06/05/2020  . Alcohol use disorder, severe, dependence ([place]) 05/28/2020  . Acquired thrombophilia ([place])   . Elevated LFTs 04/06/2020  . Fall 04/06/2020  . Rhabdomyolysis 04/06/2020  . Elevated lactic acid level 04/06/2020  . Sepsis ([place]) 03/17/2020  . Constipation 03/17/2020  . Paroxysmal A-fib ([place]) 03/17/2020  . Severe protein-calorie malnutrition ([place]) 02/25/2020  . Left inguinal hernia 01/31/2020  . Encounter for competency evaluation   . Depression 01/17/2020  . Esophagitis   . Nausea without vomiting   . Weakness   . Tremor   . Pancytopenia ([place])   . Hypomagnesemia   . Alcoholic cirrhosis of liver without ascites ([place])   . Thrombocytopenia ([place])   . Non-traumatic rhabdomyolysis   . Alcohol withdrawal delirium ([place]) 12/08/2019  . ETOH abuse   . Alcoholic intoxication with complication ([place])   .  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 Hypokalemia 09/27/2019  . Dysphagia 09/27/2019  . Alcohol withdrawal delirium, acute, hyperactive ([place]) 09/21/2019  . Pressure injury of ankle, stage 1 08/15/2019  . Goals of care, counseling/discussion   . Palliative care by specialist   . Alcohol withdrawal ([place]) 03/20/2019  . Low back pain 11/15/2018  . Acute metabolic encephalopathy 79/01/8332  . Delirium tremens ([place]) 03/08/2018  . Malnutrition of moderate degree 11/24/2017  . HTN (hypertension) 09/16/2017  . Hypothermia 12/17/2016  . Alcohol use   . Diarrhea   . Hyponatremia 07/09/2015    Orientation RESPIRATION BLADDER Height & Weight     Self, Place, Situation  Normal Continent Weight: 54 kg Height:  5' 4"  (162.6 cm)  BEHAVIORAL SYMPTOMS/MOOD NEUROLOGICAL BOWEL NUTRITION STATUS      Continent Diet (Regular)  AMBULATORY STATUS COMMUNICATION OF NEEDS Skin   Limited Assist Verbally Normal                       Personal Care Assistance Level of Assistance  Bathing, Feeding, Dressing Bathing Assistance: Maximum assistance Feeding assistance: Independent Dressing Assistance: Maximum assistance     Functional Limitations Info  Sight, Hearing, Speech Sight Info: Adequate Hearing Info: Adequate Speech Info: Adequate    SPECIAL CARE FACTORS FREQUENCY  PT (By licensed PT), OT (By licensed OT)     PT Frequency: 5x a week OT Frequency: 5x a week            Contractures Contractures Info: Not present    Additional Factors Info  Code Status, Allergies Code Status  Info: Full Code Allergies Info: hydrochlorothiazide           Current Medications (09/20/2020):  This is the current hospital active medication list Current Facility-Administered Medications  Medication Dose Route Frequency Provider Last Rate Last Admin  . atenolol (TENORMIN) tablet 25 mg  25 mg Oral BID Lucrezia Starch, MD   25 mg at 09/20/20 0956  . baclofen  (LIORESAL) tablet 10 mg  10 mg Oral TID Lucrezia Starch, MD   10 mg at 09/20/20 0956  . LORazepam (ATIVAN) injection 0-4 mg  0-4 mg Intravenous Q6H Lucrezia Starch, MD       Or  . LORazepam (ATIVAN) tablet 0-4 mg  0-4 mg Oral Q6H Lucrezia Starch, MD   2 mg at 09/20/20 0956  . [START ON 09/22/2020] LORazepam (ATIVAN) injection 0-4 mg  0-4 mg Intravenous Q12H Lucrezia Starch, MD       Or  . Derrill Memo ON 09/22/2020] LORazepam (ATIVAN) tablet 0-4 mg  0-4 mg Oral Q12H Lucrezia Starch, MD      . PARoxetine (PAXIL) tablet 30 mg  30 mg Oral Daily Lucrezia Starch, MD   30 mg at 09/20/20 0957  . thiamine tablet 100 mg  100 mg Oral Daily Lucrezia Starch, MD   100 mg at 09/20/20 2202   Or  . thiamine (B-1) injection 100 mg  100 mg Intravenous Daily Lucrezia Starch, MD       Current Outpatient Medications  Medication Sig Dispense Refill  . acetaminophen (TYLENOL) 500 MG tablet Take 1 tablet (500 mg total) by mouth every 8 (eight) hours as needed for mild pain or fever. 15 tablet 0  . Ascorbic Acid (VITAMIN C) 1000 MG tablet Take 1,000 mg by mouth daily.    [name] aspirin EC 81 MG tablet Take 81 mg by mouth daily. Swallow whole.    [name] atenolol (TENORMIN) 25 MG tablet Take 25 mg by mouth 2 (two) times daily.     . baclofen (LIORESAL) 10 MG tablet Take 10 mg by mouth 3 (three) times daily.    . calcium-vitamin D (OSCAL WITH D) 500-200 MG-UNIT tablet Take 1 tablet by mouth daily with breakfast.    . Cholecalciferol 125 MCG (5000 UT) TABS Take 5,000 Units by mouth daily.    [name] doxepin (SINEQUAN) 10 MG capsule Take 10 mg by mouth at bedtime as needed (sleep).     . feeding supplement, ENSURE ENLIVE, (ENSURE ENLIVE) LIQD Take 237 mLs by mouth 2 (two) times daily between meals. 542 mL 12  . folic acid (FOLVITE) 1 MG tablet Take 1 tablet (1 mg total) by mouth daily. 30 tablet 0  . hydrOXYzine (ATARAX/VISTARIL) 25 MG tablet Take 25 mg by mouth 3 (three) times daily as needed for anxiety or itching.     . Multiple  Vitamin (MULTIVITAMIN WITH MINERALS) TABS tablet Take 1 tablet by mouth daily. 30 tablet 0  . omeprazole (PRILOSEC OTC) 20 MG tablet Take 1 tablet (20 mg total) by mouth daily. (Patient not taking: Reported on 09/05/2020) 28 tablet 1  . PARoxetine (PAXIL) 30 MG tablet Take 30 mg by mouth daily.    [name] senna-docusate (SENOKOT-S) 8.6-50 MG tablet Take 2 tablets by mouth at bedtime. 30 tablet 1  . thiamine 100 MG tablet Take 1 tablet (100 mg total) by mouth daily. 30 tablet 0     Discharge Medications: Please see discharge summary for a list of discharge medications.  Relevant Imaging Results:  Relevant Lab Results:   Additional Information SS# [id number]  Victorino Dike, RN

## 2020-09-20 NOTE — TOC Initial Note (Signed)
Transition of Care (TOC) - Initial/Assessment Note    Patient Details  Name: Douglas Peterson. MRN: [id number] Date of Birth: October 16, 1951  Transition of Care [facility]) CM/SW Contact:    [name] Phone Number: [phone] 09/20/2020, 8:51 AM  Clinical Narrative:                  TOC received a call from [facility] [phone], stating she was concerned about the patient's safety at home on his own.       Patient Goals and CMS Choice        Expected Discharge Plan and Services                                                Prior Living Arrangements/Services                       Activities of Daily Living      Permission Sought/Granted                  Emotional Assessment              Admission diagnosis:  EMS ETOH Patient Active Problem List   Diagnosis Date Noted  . Poor social situation 08/18/2020  . Lactic acidosis 08/18/2020  . Cellulitis 08/18/2020  . SIRS (systemic inflammatory response syndrome) ([place]) 08/05/2020  . Right hip pain 08/05/2020  . Hypophosphatemia 08/05/2020  . Closed fracture of distal clavicle_Right 06/05/2020  . GERD (gastroesophageal reflux disease) 06/05/2020  . Alcohol use disorder, severe, dependence ([place]) 05/28/2020  . Acquired thrombophilia ([place])   . Elevated LFTs 04/06/2020  . Fall 04/06/2020  . Rhabdomyolysis 04/06/2020  . Elevated lactic acid level 04/06/2020  . Sepsis ([place]) 03/17/2020  . Constipation 03/17/2020  . Paroxysmal A-fib ([place]) 03/17/2020  . Severe protein-calorie malnutrition ([place]) 02/25/2020  . Left inguinal hernia 01/31/2020  . Encounter for competency evaluation   . Depression 01/17/2020  . Esophagitis   . Nausea without vomiting   . Weakness   . Tremor   . Pancytopenia ([place])   . Hypomagnesemia   . Alcoholic cirrhosis of liver without ascites ([place])   . Thrombocytopenia ([place])   . Non-traumatic rhabdomyolysis   . Alcohol withdrawal delirium ([place])  12/08/2019  . ETOH abuse   . Alcoholic intoxication with complication ([place])   . 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 Hypokalemia 09/27/2019  . Dysphagia 09/27/2019  . Alcohol withdrawal delirium, acute, hyperactive ([place]) 09/21/2019  . Pressure injury of ankle, stage 1 08/15/2019  . Goals of care, counseling/discussion   . Palliative care by specialist   . Alcohol withdrawal ([place]) 03/20/2019  . Low back pain 11/15/2018  . Acute metabolic encephalopathy 40/06/6760  . Delirium tremens ([place]) 03/08/2018  . Malnutrition of moderate degree 11/24/2017  . HTN (hypertension) 09/16/2017  . Hypothermia 12/17/2016  . Alcohol use   . Diarrhea   . Hyponatremia 07/09/2015   PCP:  [name], MD Pharmacy:   [place], [place] - [place] [address] [place] [address] Phone: [phone] Fax: [phone]     Social Determinants of Health (SDOH) Interventions    Readmission Risk Interventions Readmission Risk Prevention Plan 08/23/2020 08/19/2020 08/14/2020  Transportation Screening Complete Complete Complete  PCP or Specialist Appt within 3-5 Days - - -  [place] or [place] Screening - - -  Medication Review (RN Care Manager) Complete Complete -  PCP or Specialist appointment within 3-5 days of discharge (No Data) Complete Complete  HRI or Home Care Consult Complete - Complete  SW Recovery Care/Counseling Consult - Complete Complete  Palliative Care Screening - Not Applicable Not [place] Not Applicable - Patient Refused  Some recent data might be hidden

## 2020-09-20 NOTE — ED Notes (Signed)
Hourly rounding completed at this time, patient currently awake in hallway bed. No complaints, stable, and in no acute distress. Q15 minute rounds and monitoring via Engineer, drilling to continue.

## 2020-09-20 NOTE — TOC Initial Note (Signed)
Transition of Care (TOC) - Initial/Assessment Note    Patient Details  Name: Douglas Peterson. MRN: [id number] Date of Birth: 1951/05/06  Transition of Care [facility]) CM/SW Contact:    Victorino Dike, RN Phone Number: 09/20/2020, 2:03 PM  Clinical Narrative:                 Met with patient in ER, spoke of SNF as recommendation.  Patient would like to take care of himself but is agreeable to rehab at this time.  Patient still very lethargic and having difficulty staying awake to talk to me at this time.  FL2 completed and bed search started.   Expected Discharge Plan: Skilled Nursing Facility Barriers to Discharge: Continued Medical Work up   Patient Goals and CMS Choice   CMS Medicare.gov Compare Post Acute Care list provided to:: Patient Choice offered to / list presented to : Patient  Expected Discharge Plan and Services Expected Discharge Plan: [place]   Discharge Planning Services: CM Consult   Living arrangements for the past 2 months: Single Family Home                                      Prior Living Arrangements/Services Living arrangements for the past 2 months: Single Family Home Lives with:: Self Patient language and need for interpreter reviewed:: No Do you feel safe going back to the place where you live?: Yes      Need for Family Participation in Patient Care: No (Comment) Care giver support system in place?: No (comment)   Criminal Activity/Legal Involvement Pertinent to Current Situation/Hospitalization: No - Comment as needed  Activities of Daily Living      Permission Sought/Granted                  Emotional Assessment Appearance:: Appears older than stated age Attitude/Demeanor/Rapport: Avoidant   Orientation: : Oriented to Self, Oriented to Place, Oriented to Situation Alcohol / Substance Use: Alcohol Use Psych Involvement: No (comment)  Admission diagnosis:  EMS ETOH Patient Active Problem List    Diagnosis Date Noted  . Poor social situation 08/18/2020  . Lactic acidosis 08/18/2020  . Cellulitis 08/18/2020  . SIRS (systemic inflammatory response syndrome) ([place]) 08/05/2020  . Right hip pain 08/05/2020  . Hypophosphatemia 08/05/2020  . Closed fracture of distal clavicle_Right 06/05/2020  . GERD (gastroesophageal reflux disease) 06/05/2020  . Alcohol use disorder, severe, dependence ([place]) 05/28/2020  . Acquired thrombophilia ([place])   . Elevated LFTs 04/06/2020  . Fall 04/06/2020  . Rhabdomyolysis 04/06/2020  . Elevated lactic acid level 04/06/2020  . Sepsis ([place]) 03/17/2020  . Constipation 03/17/2020  . Paroxysmal A-fib ([place]) 03/17/2020  . Severe protein-calorie malnutrition ([place]) 02/25/2020  . Left inguinal hernia 01/31/2020  . Encounter for competency evaluation   . Depression 01/17/2020  . Esophagitis   . Nausea without vomiting   . Weakness   . Tremor   . Pancytopenia ([place])   . Hypomagnesemia   . Alcoholic cirrhosis of liver without ascites ([place])   . Thrombocytopenia ([place])   . Non-traumatic rhabdomyolysis   . Alcohol withdrawal delirium ([place]) 12/08/2019  . ETOH abuse   . Alcoholic intoxication with complication ([place])   . 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 Hypokalemia 09/27/2019  . Dysphagia 09/27/2019  . Alcohol withdrawal delirium, acute, hyperactive ([place]) 09/21/2019  . Pressure injury of ankle, stage 1  08/15/2019  . Goals of care, counseling/discussion   . Palliative care by specialist   . Alcohol withdrawal ([place]) 03/20/2019  . Low back pain 11/15/2018  . Acute metabolic encephalopathy 49/86/5168  . Delirium tremens ([place]) 03/08/2018  . Malnutrition of moderate degree 11/24/2017  . HTN (hypertension) 09/16/2017  . Hypothermia 12/17/2016  . Alcohol use   . Diarrhea   . Hyponatremia 07/09/2015   PCP:  [name], MD Pharmacy:   [facility] [address] [place] - [place] [address] [place] [address] Phone: [phone] Fax: [phone]     Social Determinants of Health (SDOH) Interventions    Readmission Risk Interventions Readmission Risk Prevention Plan 08/23/2020 08/19/2020 08/14/2020  Transportation Screening Complete Complete Complete  PCP or Specialist Appt within 3-5 Days - - -  HRI or [place] - - -  Palliative Care Screening - - -  Medication Review (RN Care Manager) Complete Complete -  PCP or Specialist appointment within 3-5 days of discharge (No Data) Complete Complete  HRI or Home Care Consult Complete - Complete  SW Recovery Care/Counseling Consult - Complete Complete  Palliative Care Screening - Not Applicable Not [place] Not Applicable - Patient Refused  Some recent data might be hidden
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
## 2020-09-20 NOTE — ED Notes (Signed)
unable to ambulate patient as ordered, Patient has been a 2 man assist all morning utilizing w/c.  Patient high risk fall, not steady on his feet.

## 2020-09-20 NOTE — ED Notes (Signed)
Assumed care of patient, patient up early trying to get out of bed. Patient a 2 man assist into w/c patient unable to bare weight safely on his own, very unstable on his feet. Patient assisted into bathroom where he had a large firmed bowel movement. Patient assisted back into bed. 2 people to get him into chair/bathroom and back in bed.

## 2020-09-21 NOTE — ED Notes (Signed)
Pt given breakfast tray

## 2020-09-21 NOTE — ED Notes (Signed)
Pt given lunch tray.

## 2020-09-21 NOTE — ED Notes (Signed)
Hourly rounding reveals patient in room. No complaints, stable, in no acute distress. Q15 minute rounds and monitoring via Engineer, drilling to continue.

## 2020-09-21 NOTE — ED Notes (Signed)
Pt brief changed and pericare performed by this tech and Amy RN.

## 2020-09-22 NOTE — ED Notes (Signed)
This RN and EDT at bedside. Pt attempting to get out of bed and redirected at this time. Pt assisted to use urinal. Pt cleaned and new brief in place.

## 2020-09-22 NOTE — ED Notes (Signed)
[name] vol pending placement

## 2020-09-22 NOTE — ED Notes (Signed)
Pt given meal tray.

## 2020-09-22 NOTE — ED Notes (Signed)
Pt lying in bed.  Pt states he wishes to go home. This RN and Zach NT educated pt on importance of staying and receiving appropriate care. Pt states understanding.

## 2020-09-23 NOTE — ED Notes (Signed)
Pt asked for help. Pt noted to have urinated in bed. Pt changed and stated he was scared because he kept seeing the police walk by and was afraid to go outside. Pt stated he never fired a Neurosurgeon and never stole a pistol. Pt noted to be disoriented to circumstances.

## 2020-09-23 NOTE — ED Notes (Signed)
Pt resting quietly at this time. No distress noted. Pt is awake and denies pain or needs at this time. Call bell in reach.

## 2020-09-23 NOTE — ED Notes (Signed)
Pt disoriented to circumstance and place. Pt oriented to self. Pt able to answer questions. Pt asked NT to get clothes and go home. When asked by this RN pt states he doesn't remember asking about it. Pt able to take medication. Pt given water.

## 2020-09-23 NOTE — ED Notes (Signed)
Dinner tray given

## 2020-09-23 NOTE — ED Notes (Signed)
Pt noted to be asking for help. RN went into room and pt was laying in bed. Pt started to apologize, but pt would take lots of pauses between words and in his sentences and seemed to have a horce voice. ER provider notified and went into room to see pt oxygen saturation and heart rate checked.

## 2020-09-23 NOTE — ED Notes (Signed)
Breakfast tray given. °

## 2020-09-23 NOTE — ED Notes (Signed)
Pt resting comfortably at this time. Pt currently eating dinner. No issues noted at this time. Pt calm and cooperative at this time.

## 2020-09-23 NOTE — ED Notes (Signed)
Pt resting quietly at this time

## 2020-09-23 NOTE — ED Notes (Signed)
Pt resting in bed, eyes closed.  Will continue 15 minute checks

## 2020-09-23 NOTE — ED Notes (Signed)
Pt states to this RN he wants to leave and go home but is having flight of ideas and thinks he is at the bar, pt reassured he is not at the bar and is safe at this time. Pt also states he does not know where his car keys or car is. Pt reassured he is currently safe at this time and that we are waiting for social work to evaluate him at their earliest convience, pt verbalized understanding. NAD noted. Pt calm and cooperative at this time.

## 2020-09-23 NOTE — ED Notes (Signed)
Pt given lunch tray.

## 2020-09-23 NOTE — ED Notes (Signed)
Pt currently eating breakfast without difficulty. Pt helped with getting breakfast ready to eat. Pt requests beer but educated this is not an option at this time.

## 2020-09-24 NOTE — ED Provider Notes (Signed)
-----------------------------------------   6:09 AM on 09/24/2020 -----------------------------------------   Blood pressure (!) 166/90, pulse 72, temperature 98.9 F (37.2 C), temperature source Oral, resp. rate 18, height 5' 4"  (1.626 m), weight 54 kg, SpO2 95 %.  The patient is resting at this time.  There have been no acute events since the last update.  Awaiting SNF placement by social work.   Paulette Blanch, MD 09/24/20 [phone]
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
## 2020-09-24 NOTE — ED Notes (Signed)
Pt got out of bed in an attempt to leave hospital.   Pt put back in bed by NT and this Probation officer.

## 2020-09-24 NOTE — ED Notes (Signed)
Pt attempting to leave bed from foot, pt placed back in bed by this RN and Lattie Haw, EDT, pt reports needing to call brother, pt reoriented to time; pt tv on CARE channel

## 2020-09-25 LAB — RESP PANEL BY RT-PCR (FLU A&B, COVID) ARPGX2
Influenza A by PCR: NEGATIVE
Influenza B by PCR: NEGATIVE
SARS Coronavirus 2 by RT PCR: NEGATIVE

## 2020-09-25 NOTE — ED Notes (Signed)
Pt sleeping, will collect vitals upon waking.

## 2020-09-25 NOTE — Progress Notes (Signed)
OT Cancellation Note  Patient Details Name: Douglas Peterson. MRN: [id number] DOB: 1951-04-16   Cancelled Treatment:    Reason Eval/Treat Not Completed: Patient at procedure or test/ unavailable  Pt on video-conference court hearing at this time and unavailable for treatment. Will f/u at later date/time for occupational therapy. Thank you.  Gerrianne Scale, [place], OTR/L ascom [phone] 09/25/20, 2:37 PM

## 2020-09-25 NOTE — TOC Progression Note (Signed)
Transition of Care (TOC) - Progression Note    Patient Details  Name: Douglas Peterson. MRN: [id number] Date of Birth: 1951/11/02  [facility] Care [facility]) CM/SW [place] Phone Number: [phone] 09/25/2020, 9:04 AM  Clinical Narrative:     CSW went to update patient on guardianship hearing for 09/25/2020.  Patient was sleeping and did not wake up when CSW called his name.  Expected Discharge Plan: Skilled Nursing Facility Barriers to Discharge: Continued Medical Work up  Expected Discharge Plan and Services Expected Discharge Plan: [place]   Discharge Planning Services: CM Consult   Living arrangements for the past 2 months: Single Family Home                                       Social Determinants of Health (SDOH) Interventions    Readmission Risk Interventions Readmission Risk Prevention Plan 08/23/2020 08/19/2020 08/14/2020  Transportation Screening Complete Complete Complete  PCP or Specialist Appt within 3-5 Days - - -  HRI or [place] - - -  Palliative Care Screening - - -  Medication Review (RN Care Manager) Complete Complete -  PCP or Specialist appointment within 3-5 days of discharge (No Data) Complete Complete  HRI or Home Care Consult Complete - Complete  SW Recovery Care/Counseling Consult - Complete Complete  Palliative Care Screening - Not Applicable Not [place] Not Applicable - Patient Refused  Some recent data might be hidden

## 2020-09-25 NOTE — ED Notes (Signed)
VOL/  PENDING  PLACEMENT

## 2020-09-25 NOTE — TOC Progression Note (Signed)
Transition of Care (TOC) - Progression Note    Patient Details  Name: Detron Carras. MRN: [id number] Date of Birth: [birth year]  Transition of Care [facility]) CM/SW [place], [place] Phone Number: [phone] 09/25/2020, 2:47 PM  Clinical Narrative:     CSW is currently with patient during guardianship court hearing.  [name] using CSW telephone due to technical difficulties with DSS laptop.  [place] is in the room as well.  Expected Discharge Plan: Skilled Nursing Facility Barriers to Discharge: Continued Medical Work up  Expected Discharge Plan and Services Expected Discharge Plan: [place]   Discharge Planning Services: CM Consult   Living arrangements for the past 2 months: Single Family Home                                       Social Determinants of Health (SDOH) Interventions    Readmission Risk Interventions Readmission Risk Prevention Plan 08/23/2020 08/19/2020 08/14/2020  Transportation Screening Complete Complete Complete  PCP or Specialist Appt within 3-5 Days - - -  HRI or [place] - - -  Palliative Care Screening - - -  Medication Review (RN Care Manager) Complete Complete -  PCP or Specialist appointment within 3-5 days of discharge (No Data) Complete Complete  HRI or Home Care Consult Complete - Complete  SW Recovery Care/Counseling Consult - Complete Complete  Palliative Care Screening - Not Applicable Not [place] Not Applicable - Patient Refused  Some recent data might be hidden

## 2020-09-25 NOTE — ED Notes (Signed)
Pt brief changed at this time

## 2020-09-25 NOTE — ED Notes (Signed)
Pt eating lunch tray at this time

## 2020-09-25 NOTE — ED Notes (Signed)
Social work at bedside with DSS

## 2020-09-25 NOTE — ED Notes (Signed)
Pt given a cup of coffee. Pt has no further needs at this time. Will continue to monitor q65mn.

## 2020-09-25 NOTE — ED Provider Notes (Signed)
-----------------------------------------   6:13 AM on 09/25/2020 -----------------------------------------   Blood pressure 133/82, pulse 66, temperature 99 F (37.2 C), temperature source Oral, resp. rate 18, height 5' 4"  (1.626 m), weight 54 kg, SpO2 96 %.  The patient is resting at this time.  There have been no acute events since the last update.  Awaiting SNF placement by social work.   Paulette Blanch, MD 09/25/20 [phone]

## 2020-09-25 NOTE — ED Notes (Signed)
PT still in with social work  PT in low bed

## 2020-09-25 NOTE — ED Notes (Signed)
Pt given coke at [employer] time

## 2020-09-25 NOTE — ED Notes (Signed)
Pt given breakfast tray a cup of coffee.

## 2020-09-25 NOTE — ED Notes (Signed)
PT at bedside.
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
## 2020-09-26 MED ORDER — DOCUSATE SODIUM 100 MG PO CAPS
100.0000 mg | ORAL_CAPSULE | Freq: Once | ORAL | Status: AC
  Administered 2020-09-26: 100 mg via ORAL

## 2020-09-26 MED ORDER — DOCUSATE SODIUM 100 MG PO CAPS: 100.0000 mg | ORAL_CAPSULE | Freq: Once | ORAL | Status: DC

## 2020-09-26 NOTE — ED Notes (Signed)
VOLUNTARY/pending placement

## 2020-09-26 NOTE — ED Notes (Signed)
Pt ambulated to toilet with assistance. Pt unsteady on feet

## 2020-09-26 NOTE — TOC Progression Note (Signed)
Transition of Care (TOC) - Progression Note    Patient Details  Name: Douglas Peterson. MRN: [id number] Date of Birth: 02/27/1951  Transition of Care [facility]) CM/SW [place], RN Phone Number: 09/26/2020, 1:28 PM  Clinical Narrative:     [place] DSS is now Guardian over [employer].  They are the contact for all decisions for his care.    Expected Discharge Plan: Skilled Nursing Facility Barriers to Discharge: Continued Medical Work up  Expected Discharge Plan and Services Expected Discharge Plan: [place]   Discharge Planning Services: CM Consult   Living arrangements for the past 2 months: Single Family Home                                       Social Determinants of Health (SDOH) Interventions    Readmission Risk Interventions Readmission Risk Prevention Plan 08/23/2020 08/19/2020 08/14/2020  Transportation Screening Complete Complete Complete  PCP or Specialist Appt within 3-5 Days - - -  [place]RI or [place] - - -  Palliative Care Screening - - -  Medication Review (RN Care Manager) Complete Complete -  PCP or Specialist appointment within 3-5 days of discharge (No Data) Complete Complete  HRI or Home Care Consult Complete - Complete  SW Recovery Care/Counseling Consult - Complete Complete  Palliative Care Screening - Not Applicable Not [place] Not Applicable - Patient Refused  Some recent data might be hidden

## 2020-09-26 NOTE — ED Provider Notes (Signed)
-----------------------------------------   8:05 AM on 09/26/2020 -----------------------------------------   Blood pressure (!) 153/77, pulse (!) 58, temperature 98.7 F (37.1 C), temperature source Oral, resp. rate 19, height 1.626 m (5' 4" ), weight 54 kg, SpO2 95 %.  The patient is calm and cooperative at this time.  There have been no acute events since the last update.  [name] now under guardianship and lacks capacity to make his own decisions.  Awaiting disposition plan from transition of care team.   Hinda Kehr, MD 09/26/20 [phone]

## 2020-09-26 NOTE — ED Notes (Signed)
Pt given coffee at this time

## 2020-09-26 NOTE — ED Notes (Signed)
Pt placed in clean brief and given bedbath by this RN and Caitlyn EDT. Moisture cream applied by Caitlyn EDT under R armpit.

## 2020-09-26 NOTE — ED Notes (Signed)
Brief was changed and given meal tray with apple juice.

## 2020-09-27 NOTE — ED Notes (Signed)
Pt given meal tray at this time 

## 2020-09-27 NOTE — Consult Note (Signed)
[name] for social work placement for a SNF.  Medications and mood will be monitored by the psych team on rounding in the am on 11/26.  Waylan Boga, PMHNP

## 2020-09-27 NOTE — ED Notes (Signed)
Pt sleeping, VS not obtained

## 2020-09-27 NOTE — ED Notes (Signed)
Pt sleeping at this time.

## 2020-09-27 NOTE — ED Notes (Signed)
Pt given coffee.

## 2020-09-27 NOTE — ED Provider Notes (Signed)
Emergency Medicine Observation Re-evaluation Note  Douglas Peterson. is a 69 y.o. male, seen on rounds today.  Pt initially presented to the ED for complaints of Alcohol Intoxication Currently, the patient is seated in chair, watching television drinking juice without distress.  Physical Exam  BP (!) 155/78 (BP Location: Left Arm)   Pulse 64   Temp 98.2 F (36.8 C) (Oral)   Resp 18   Ht 5' 4"  (1.626 m)   Wt 54 kg   SpO2 95%   BMI 20.43 kg/m  Physical Exam General: Alert, he is oriented to year and date being Thanksgiving.  He is very calm and pleasant. Cardiac: Normal perfusion Lungs: Full clear sentences, normal oxygen saturation no distress Psych: Calm, well oriented.  Seems somewhat more insightful than I had anticipated and the fact that he describes it as home living situation desire to return to home, his desire to stay sober.  ED Course / MDM  EKG:    I have reviewed the labs performed to date as well as medications administered while in observation.  Recent changes in the last 24 hours include no major changes, awaiting psych dispo.  Plan  Current plan is for awaiting psychiatric team disposition.  Have placed consult back to psychiatry and sent note to case management as the patient reports that he would like to be able to return to his own home.  However documentation appears that he has been assigned a DSS guardian. Wish to affirm that patient truly needs guardian.    Delman Kitten, MD 09/27/20 [phone]

## 2020-09-28 DIAGNOSIS — F1020 Alcohol dependence, uncomplicated: Secondary | ICD-10-CM | POA: Diagnosis not present

## 2020-09-28 NOTE — ED Notes (Signed)
Pt eating dinner tray at this time

## 2020-09-28 NOTE — ED Notes (Signed)
Pt given breakfast tray at this time. Pt eating in recliner.

## 2020-09-28 NOTE — ED Notes (Signed)
Dietary called and informed of needing hot meal tray versus box tray sent up for pt. He is unable to eat those trays.

## 2020-09-28 NOTE — ED Notes (Signed)
Pt sitting up in recliner and watching TV at this time. Pt denies any needs.
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
## 2020-09-28 NOTE — ED Provider Notes (Signed)
Emergency Medicine Observation Re-evaluation Note  Douglas Peterson. is a 69 y.o. male, seen on rounds today.  Pt initially presented to the ED for complaints of Alcohol Intoxication Currently, the patient is sitting comfortably and without acute complaints.  Physical Exam  BP (!) 159/89 (BP Location: Left Arm)   Pulse 63   Temp 97.9 F (36.6 C) (Oral)   Resp 18   Ht 5' 4"  (1.626 m)   Wt 54 kg   SpO2 98%   BMI 20.43 kg/m  Physical Exam General: Alert and oriented. Comfortable appearing Cardiac: Good peripheral perfusion. Lungs: Normal respiratory effort. Psych: Calm and cooperative.  ED Course / MDM  I have reviewed the labs performed to date as well as medications administered while in observation. There have been no recent events within last 24 hours.  Plan  Current plan is for disposition per social work. [name] not under full IVC at this time.   Arta Silence, MD 09/28/20 2303

## 2020-09-28 NOTE — ED Notes (Signed)
Pt walking around in room. Pt denies any needs at this time. Pt making his bed and watching TV
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
## 2020-09-28 NOTE — TOC Progression Note (Signed)
Transition of Care (TOC) - Progression Note    Patient Details  Name: Douglas Peterson. MRN: [id number] Date of Birth: 18-Nov-1950  Transition of Care [facility] CM/SW Contact  Anselm Pancoast, RN Phone Number: 09/28/2020, 9:14 AM  Clinical Narrative:     Patient has guardian of [place] and is scheduled for discharge to [facility] once financial details are confirmed between financial executor and DSS.    Expected Discharge Plan: Skilled Nursing Facility Barriers to Discharge: Continued Medical Work up  Expected Discharge Plan and Services Expected Discharge Plan: [place]   Discharge Planning Services: CM Consult   Living arrangements for the past 2 months: Single Family Home                                       Social Determinants of Health (SDOH) Interventions    Readmission Risk Interventions Readmission Risk Prevention Plan 08/23/2020 08/19/2020 08/14/2020  Transportation Screening Complete Complete Complete  PCP or Specialist Appt within 3-5 Days - - -  HRI or [place] - - -  Palliative Care Screening - - -  Medication Review (RN Care Manager) Complete Complete -  PCP or Specialist appointment within 3-5 days of discharge (No Data) Complete Complete  HRI or Home Care Consult Complete - Complete  SW Recovery Care/Counseling Consult - Complete Complete  Palliative Care Screening - Not Applicable Not [place] Not Applicable - Patient Refused  Some recent data might be hidden

## 2020-09-28 NOTE — ED Notes (Signed)
Pt given hot meal tray. Pt very appreciative of tray and able to eat all food provided.

## 2020-09-28 NOTE — ED Notes (Signed)
Pt up and ambulatory to chair. PT provided coffee at request.

## 2020-09-28 NOTE — ED Notes (Signed)
PT at bedside.

## 2020-09-29 MED ORDER — ACETAMINOPHEN 325 MG PO TABS
650.0000 mg | ORAL_TABLET | Freq: Once | ORAL | Status: AC
  Administered 2020-09-29: 650 mg via ORAL
  Filled 2020-09-29: qty 2

## 2020-09-29 NOTE — ED Notes (Signed)
Pt given lunch tray.

## 2020-09-29 NOTE — ED Notes (Signed)
Patient had episode urinary incontinence. Patient cleaned up, new gown and underwear placed on patient. Linens and chuck pad changed. Patient back to bed and lights dimmed.

## 2020-09-29 NOTE — ED Notes (Signed)
Pt provided with meal tray per request.

## 2020-09-29 NOTE — ED Provider Notes (Signed)
Emergency Medicine Observation Re-evaluation Note  Rishi Vicario. is a 69 y.o. male, seen on rounds today.  Pt initially presented to the ED for complaints of Alcohol Intoxication Currently, the patient is sitting comfortably in the recliner. States he has a slight headache..  Physical Exam  BP (!) 153/88 (BP Location: Left Arm)   Pulse 61   Temp 97.8 F (36.6 C) (Oral)   Resp 16   Ht 5' 4"  (1.626 m)   Wt 54 kg   SpO2 96%   BMI 20.43 kg/m  Physical Exam General: awake, alert Cardiac: RRR, no m/r/g Lungs: clear Psych: calm  ED Course / MDM  I have reviewed the labs performed to date as well as medications administered while in observation.   Plan  Current plan is for social work disposition. [name] not under full IVC at this time.   Nance Pear, MD 09/29/20 1535

## 2020-09-29 NOTE — TOC Progression Note (Signed)
Transition of Care (TOC) - Progression Note    Patient Details  Name: Douglas Peterson. MRN: [id number] Date of Birth: 1951/08/09  Transition of Care [facility]) CM/SW [place], LCSW Phone Number: 09/29/2020, 5:42 PM  Clinical Narrative:   SW spent time talking to patient about discharge plan.  Patient insisting that he want to go home.  Fears that his family will take over his home and belongings. Patient seemed to calm down after SW interactions.  Wants to prove he could walk.  Noted that he is strong enough to live in his home.    Expected Discharge Plan: Skilled Nursing Facility Barriers to Discharge: Continued Medical Work up  Expected Discharge Plan and Services Expected Discharge Plan: [place]   Discharge Planning Services: CM Consult   Living arrangements for the past 2 months: Single Family Home                                       Social Determinants of Health (SDOH) Interventions    Readmission Risk Interventions Readmission Risk Prevention Plan 08/23/2020 08/19/2020 08/14/2020  Transportation Screening Complete Complete Complete  PCP or Specialist Appt within 3-5 Days - - -  HRI or [place] - - -  Palliative Care Screening - - -  Medication Review (RN Care Manager) Complete Complete -  PCP or Specialist appointment within 3-5 days of discharge (No Data) Complete Complete  HRI or Home Care Consult Complete - Complete  SW Recovery Care/Counseling Consult - Complete Complete  Palliative Care Screening - Not Applicable Not [place] Not Applicable - Patient Refused  Some recent data might be hidden

## 2020-09-29 NOTE — ED Notes (Signed)
[name] Vol pending pacement

## 2020-09-29 NOTE — ED Notes (Addendum)
Loud thud heard in room- this RN, Karie Kirks, and Mickel Baas RN to bedside to find pt in floor- pt denies hitting his head- when asked why he walking to the doorway of room pt states he "wants a mask"- pt given mask and pt then states that he "need to go home, I can't do this anymore"- this RN explained to pt that he was unable to leave d/t safety concerns at home and to stay either in bed or in the recliner- pt states understanding need to stay in recliner- pt states it was a trip and fall

## 2020-09-29 NOTE — TOC Progression Note (Addendum)
Transition of Care (TOC) - Progression Note    Patient Details  Name: Douglas Peterson. MRN: [id number] Date of Birth: 1951/09/29  Transition of Care [facility]) CM/SW [place], LCSW Phone Number: 09/29/2020, 9:59 AM  Clinical Narrative:   SW made follow-up call to [facility] DSS/APS to get update on case.  DSS/APS is working on a placement for this patient.   Patient has a guardian: Midwife.  Hilarie Fredrickson is this patient's worker [phone].   Expected Discharge Plan: Skilled Nursing Facility Barriers to Discharge: Continued Medical Work up  Expected Discharge Plan and Services Expected Discharge Plan: [place]   Discharge Planning Services: CM Consult   Living arrangements for the past 2 months: Single Family Home                   Social Determinants of Health (SDOH) Interventions    Readmission Risk Interventions Readmission Risk Prevention Plan 08/23/2020 08/19/2020 08/14/2020  Transportation Screening Complete Complete Complete  PCP or Specialist Appt within 3-5 Days - - -  HRI or [place] - - -  Palliative Care Screening - - -  Medication Review (RN Care Manager) Complete Complete -  PCP or Specialist appointment within 3-5 days of discharge (No Data) Complete Complete  HRI or Home Care Consult Complete - Complete  SW Recovery Care/Counseling Consult - Complete Complete  Palliative Care Screening - Not Applicable Not [place] Not Applicable - Patient Refused  Some recent data might be hidden

## 2020-09-29 NOTE — ED Notes (Signed)
Pt out to hallway saying to "call the police or a cab because I'm not staying here"- pt began walking towards EMS doors to try to leave- this RN tried to redirect pt multiple times with pt refusal and he continues to request police or cab- RN Mickel Baas assisted in trying to redirect pt and pt still refusing to go back to room- EDT Wilfred Lacy and Dr Corky Downs came to assist- able to get pt back to doorway of room where Dr Corky Downs explained why he was still in the ED- social worker and PT called to work with pt to convince him to stay- EDT Wilfred Lacy able to talk pt back into room and into recliner- pt remains agitated

## 2020-09-29 NOTE — ED Notes (Signed)
Pt given coffee and apple juice- pt denies any other needs at this time

## 2020-09-29 NOTE — ED Notes (Signed)
Pt resting in bed with eyes closed and even respirations- pt breakfast tray placed on bedside table

## 2020-09-29 NOTE — ED Notes (Signed)
Pt given meal tray.

## 2020-09-29 NOTE — ED Provider Notes (Signed)
-----------------------------------------   5:21 AM on 09/29/2020 -----------------------------------------   Blood pressure (!) 159/89, pulse 63, temperature 97.9 F (36.6 C), temperature source Oral, resp. rate 18, height 5' 4"  (1.626 m), weight 54 kg, SpO2 98 %.  The patient is resting at this time.  There have been no acute events since the last update.  Awaiting disposition plan from Social Work team, most likely SNF placement.   Paulette Blanch, MD 09/29/20 [phone]

## 2020-09-30 MED ORDER — POLYETHYLENE GLYCOL 3350 17 G PO PACK
17.0000 g | PACK | Freq: Every day | ORAL | Status: DC
  Administered 2020-09-30 – 2020-11-02 (×25): 17 g via ORAL
  Filled 2020-09-30 (×31): qty 1

## 2020-09-30 NOTE — ED Notes (Signed)
Pt alert, NAD, seated in recliner at this time. Pt calm and cooperative, requests cane (specifically to bring one from his house. Pt requests that we call his sister to bring him his phone and cane). Will f/u with pt to acquire cane for pt.   Pt polite, smiling, appears to be in a good mood. Pt states he is "feeling good." Pt denies SI/HI/AH/VH. Pt has no further requests at this time.

## 2020-09-30 NOTE — ED Notes (Signed)
This tech assisted [name] with helping him shave his bread and get him all cleaned up.

## 2020-09-30 NOTE — ED Notes (Signed)
Pt expressed dissatisfaction with dinner tray brought by cafeteria. Pt voiced irritation that the sandwich trey that arrives was not a hot meal. Pt became agitated towards NT's and verbally demeaning, occasionally swearing. NT calmly redirected patient back to room and calmly informed patient that this was not an appropriate response, and we would try to procure a hot tray from the cafeteria. Pt states he believes people outside the room are making fun of him and not taking him seriously.   Dietary notified of patient request, hot tray delivered, pt seems contented at this time.

## 2020-09-30 NOTE — ED Notes (Signed)
Pt given new toothbrush.  Would like to shave also.  Set up to shave and john NT to remain in room with patient.

## 2020-09-30 NOTE — ED Notes (Signed)
Pt is voluntary and waiting SNF placement. Is not currently psych patient.  Being followed by social work.  Discussed with Mining engineer and pt is medical boarder at this time.

## 2020-09-30 NOTE — ED Notes (Addendum)
Pt again walking into hallway expressing intent to leave tomorrow. Pt returned to room.   Unable to reach PT via secure chat. Attempting to call at [facility] [place] # [phone] per secretary

## 2020-09-30 NOTE — ED Notes (Addendum)
Message left for pt legal guardian Hilarie Fredrickson is this patient's worker [phone] requesting call back for daily update. No answer.

## 2020-09-30 NOTE — ED Notes (Signed)
Pt sitting in recliner. Finished breakfast.  Ordered lunch and dinner tray for pt.  No other needs at this time.

## 2020-09-30 NOTE — ED Notes (Signed)
Pt reports has not had bowel movement in 2 days.  Informed dr Charna Archer.

## 2020-09-30 NOTE — ED Notes (Signed)
Ordered hot breakfast tray

## 2020-09-30 NOTE — ED Notes (Signed)
Pt eating breakfast tray 

## 2020-09-30 NOTE — ED Notes (Signed)
Pt expresses frustration with not having access to his home, cane, phone, and phone charger. Pt at this time expresses his intention

## 2020-09-30 NOTE — ED Notes (Addendum)
Pt is oriented.  Up to chair.  NAD. Ordered hot breakfast for pt.  No needs at this time.  Watching TV.

## 2020-09-30 NOTE — Social Work (Signed)
TOC CM/SW reached out to [facility].  DSS/APS on-call social worker.   [name] out of bed and alert (RN see notes) patient insisting that he needs [place] home.    SW waiting for a call back from DSS/APS supervisor. [place]

## 2020-09-30 NOTE — TOC Progression Note (Signed)
Transition of Care (TOC) - Progression Note    Patient Details  Name: Randi College. MRN: [id number] Date of Birth: [birth year]  Transition of Care [facility]) CM/SW [place], LCSW Phone Number: 09/30/2020, 3:13 PM  Clinical Narrative:    [facility] CM/SW received call from [facility] DSS/APS on-call supervisor requesting that we hold/keep this patient until DSS/APS is able to secure safe home placement for this patient.   Patient guardian: [facility] Department of Social Services Case worker: Gwynneth Munson [phone]  Plan: Discharge to [facility] pending insurance authorization (fiancials with [place] DSS)   Expected Discharge Plan: [place] Barriers to Discharge: Continued Medical Work up  Expected Discharge Plan and Services Expected Discharge Plan: [place]   Discharge Planning Services: CM Consult   Living arrangements for the past 2 months: Single Family Home                                       Social Determinants of Health (SDOH) Interventions    Readmission Risk Interventions Readmission Risk Prevention Plan 08/23/2020 08/19/2020 08/14/2020  Transportation Screening Complete Complete Complete  PCP or Specialist Appt within 3-5 Days - - -  HRI or [place] - - -  Palliative Care Screening - - -  Medication Review (RN Care Manager) Complete Complete -  PCP or Specialist appointment within 3-5 days of discharge (No Data) Complete Complete  HRI or Home Care Consult Complete - Complete  SW Recovery Care/Counseling Consult - Complete Complete  Palliative Care Screening - Not Applicable Not Mobile Not Applicable - Patient Refused  Some recent data might be hidden

## 2020-09-30 NOTE — ED Notes (Signed)
Pt apparently in a better mood and more cooperative, has returned to being polite with staff and staying in own room.

## 2020-09-30 NOTE — ED Notes (Signed)
Weekend SW called at [phone], she stated that she will contact DSS and see if pt can be re-evaluated for d/c home. Pt able to ambulate, is eating, drinking, toileting independently.   Pt updated on above. Pt reminded that he has a court appointed legal guardian, and the reason for that. Pt informed that we cannot discharge him without consent from the legal guardian. Pt verbalizes understanding.   PT called for cane, with no answer. Office appears to be close today.   Pt wants his sister, Ronita Hipps, called for his home cane, phone, and phone charger. Unable to locate her contact information at this time, will search paperwork and notes when time permits. Pt states he does not remember her number

## 2020-10-01 MED ORDER — POLYETHYLENE GLYCOL 3350 17 G PO PACK
17.0000 g | PACK | Freq: Once | ORAL | Status: AC
  Administered 2020-10-01: 17 g via ORAL
  Filled 2020-10-01: qty 1

## 2020-10-01 NOTE — ED Notes (Signed)
Breakfast tray was given with juice.

## 2020-10-01 NOTE — TOC Progression Note (Signed)
Transition of Care (TOC) - Progression Note    Patient Details  Name: Douglas Peterson. MRN: [id number] Date of Birth: [birth year]  Transition of Care [facility]) CM/SW [place], RN Phone Number: 10/01/2020, 11:58 AM  Clinical Narrative:    APS SW [name] facetimed with patient about guardianship.  He is still not willing to understand process.  She stated she would bring him a copy of paperwork.  We are now trying to find him placement at an ALF/group home setting.  APS is working to locate one as well.    APS SW reports if placement is found and patient does not go with SW to group home, they will need him discharged into their care and if patient does not willingly leave with SW, then police will need to be called for patient trespassing and then he police will take patient to jail or group home.       Expected Discharge Plan: Skilled Nursing Facility Barriers to Discharge: Continued Medical Work up  Expected Discharge Plan and Services Expected Discharge Plan: [place]   Discharge Planning Services: CM Consult   Living arrangements for the past 2 months: Single Family Home                                       Social Determinants of Health (SDOH) Interventions    Readmission Risk Interventions Readmission Risk Prevention Plan 08/23/2020 08/19/2020 08/14/2020  Transportation Screening Complete Complete Complete  PCP or Specialist Appt within 3-5 Days - - -  HRI or [place] - - -  Palliative Care Screening - - -  Medication Review (RN Care Manager) Complete Complete -  PCP or Specialist appointment within 3-5 days of discharge (No Data) Complete Complete  HRI or Home Care Consult Complete - Complete  SW Recovery Care/Counseling Consult - Complete Complete  Palliative Care Screening - Not Applicable Not [place] Not Applicable - Patient Refused  Some recent data might be hidden

## 2020-10-01 NOTE — ED Notes (Signed)
Pt provided dinner tray.

## 2020-10-01 NOTE — ED Provider Notes (Signed)
-----------------------------------------   5:10 AM on 10/01/2020 -----------------------------------------   Blood pressure (!) 151/70, pulse 70, temperature 97.8 F (36.6 C), temperature source Oral, resp. rate 16, height 5' 4"  (1.626 m), weight 54 kg, SpO2 97 %.  The patient is resting at this time.  There have been no acute events since the last update.  Awaiting disposition plan from Social Work team, pending SNF acceptance.   Paulette Blanch, MD 10/01/20 [phone]

## 2020-10-01 NOTE — ED Notes (Signed)
Pt assisted to chair, reminded cannot get up without assistance, wearing yellow socks, will put bed alarm in place

## 2020-10-01 NOTE — ED Notes (Signed)
Pt found out side room talking to Lobbyist. Pt expressing frustration with unsolicited placement with skilled nursing facility. This nurse asked patient to return to room and discuss situation there. Pt cooperative and returned to chair in room. Pt expressed frustration and anger at situation. 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 He feels that his treatment by legal system and DSS is "a set up" to take his home and possessions. He asked if he could call his daughter "Effie Berkshire". This nurse told him that the only number in the system was his legal guardian. He stated that the number used to be in there and that was another way that they were setting him up. Pt seemed calm at end of conversation. Will continue to round hourly. Door open and patient in view of nursing station.

## 2020-10-01 NOTE — TOC Progression Note (Signed)
Transition of Care (TOC) - Progression Note    Patient Details  Name: Douglas Peterson. MRN: [id number] Date of Birth: September 16, 1951  Transition of Care [facility]) CM/SW [place], RN Phone Number: 10/01/2020, 9:50 AM  Clinical Narrative:     Met with patient today to prepare him for his meeting with Kasey at a group home.  Patient became agitated and started using profanity while explaining he his going to his home.  He states he knows he has a guardian, but that he is going home anyway.    I have cancelled meeting with [employer] today and left message with [name] APS worker and [place] supervisor.  I requested that someone explain to him what it means to have a guardian and and executor.   [name] not willing to listen to me at this time and asking me to leave his room.     Expected Discharge Plan: Skilled Nursing Facility Barriers to Discharge: Continued Medical Work up  Expected Discharge Plan and Services Expected Discharge Plan: [place]   Discharge Planning Services: CM Consult   Living arrangements for the past 2 months: Single Family Home                                       Social Determinants of Health (SDOH) Interventions    Readmission Risk Interventions Readmission Risk Prevention Plan 08/23/2020 08/19/2020 08/14/2020  Transportation Screening Complete Complete Complete  PCP or Specialist Appt within 3-5 Days - - -  HRI or [place] - - -  Palliative Care Screening - - -  Medication Review (RN Care Manager) Complete Complete -  PCP or Specialist appointment within 3-5 days of discharge (No Data) Complete Complete  HRI or Home Care Consult Complete - Complete  SW Recovery Care/Counseling Consult - Complete Complete  Palliative Care Screening - Not Applicable Not [place] Not Applicable - Patient Refused  Some recent data might be hidden

## 2020-10-01 NOTE — ED Provider Notes (Signed)
Emergency Medicine Observation Re-evaluation Note  Douglas Peterson. is a 69 y.o. male, seen on rounds today.   Physical Exam  BP (!) 141/92 (BP Location: Left Arm)   Pulse 73   Temp 98.8 F (37.1 C) (Oral)   Resp 16   Ht 5' 4"  (1.626 m)   Wt 54 kg   SpO2 98%   BMI 20.43 kg/m  Physical Exam General: Patient ambulatory in room Lungs: Patient in no respiratory distress Psych: Patient not agitated  ED Course / MDM  EKG:  .  Plan: [name] awaiting placement by social work   Nena Polio, MD 10/01/20 1527

## 2020-10-01 NOTE — ED Notes (Signed)
Attempted to place fall alarm on bed and chair for pt, pt refused stating he will not use it because he knows why he fell over the weekend and states he needs a cane like we have told him we would give him.  Will contact therapy to see about getting pt a cane to use in room.  Discussed pt refusing alarm with Genia Hotter

## 2020-10-01 NOTE — ED Notes (Signed)
VOLUNTARY/pending placement

## 2020-10-01 NOTE — ED Notes (Signed)
Pt sleeping, unable to obtain vitals.
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
## 2020-10-01 NOTE — ED Notes (Signed)
Pt walking with PT

## 2020-10-01 NOTE — ED Notes (Signed)
Pharmacy messaged for missing meds

## 2020-10-02 MED ORDER — ACETAMINOPHEN 325 MG PO TABS
650.0000 mg | ORAL_TABLET | Freq: Once | ORAL | Status: AC
  Administered 2020-10-02: 650 mg via ORAL
  Filled 2020-10-02: qty 2

## 2020-10-02 NOTE — ED Notes (Signed)
Pt sitting up in recliner. Breakfast tray given. Pt refused to eat tray given, pt stated, "I can't eat greasy food, because I have acid reflux." Karena Addison, RN aware. Another tray was ordered.

## 2020-10-02 NOTE — ED Notes (Signed)
Hospital bed placed in pt's room. Pt in recliner with no issues.

## 2020-10-02 NOTE — ED Provider Notes (Signed)
Emergency Medicine Observation Re-evaluation Note  Douglas Peterson. is a 69 y.o. male, seen on rounds today.    Physical Exam  BP (!) 144/89 (BP Location: Left Arm)   Pulse (!) 53   Temp 98.7 F (37.1 C) (Oral)   Resp 16   Ht 5' 4"  (1.626 m)   Wt 54 kg   SpO2 98%   BMI 20.43 kg/m  Physical Exam General: Patient sitting in chair watching TV Lungs: Patient in no respiratory distress Psych patient is not agitated    Plan: [name] awaiting social work placement   Nena Polio, MD 10/02/20 1521

## 2020-10-02 NOTE — TOC Progression Note (Signed)
Transition of Care (TOC) - Progression Note    Patient Details  Name: Douglas Peterson. MRN: [id number] Date of Birth: 1951-04-20  Transition of Care [facility]) CM/SW [place], [place] Phone Number: [phone] 10/02/2020, 12:24 PM  Clinical Narrative:     CSW went to see patient with [employer] [employer] from [place].  Patient was at baseline and agreeable to conversation with CSW and [place].  Patient stated he did not want to go to group home and wanted to return home. CSW reminded patient about the hearing and that APS was his guardian, patient was not wiling to accept this but was not aggressive and only verbally disagreed.  CSW spoke with patient about group home and patient listened but stated he wanted to return home.  CSW reminded patient he spoke with his guardian Roni [facility] DSS, he stated he did not remember.  CSW told patient he would be able to go to the group home and his money would be used for his care.  Patient nodded in understanding. CSW stated she would come by and speak with the patient later on today.  Patient verbalized understanding.  [facility] [employer] stated he would accept the patient, he's only concern is the patient's willingness to go.  CSW verbalized understanding.  CSW contacted APS and spoke with Roni about finding the patient a private room.  Roni stated they would be able to arrange that.  CSW will contact [place] w/ group home about placement.   Expected Discharge Plan: Skilled Nursing Facility Barriers to Discharge: Continued Medical Work up  Expected Discharge Plan and Services Expected Discharge Plan: [place]   Discharge Planning Services: CM Consult   Living arrangements for the past 2 months: Single Family Home                                       Social Determinants of Health (SDOH) Interventions    Readmission Risk Interventions Readmission Risk Prevention Plan 08/23/2020 08/19/2020 08/14/2020   Transportation Screening Complete Complete Complete  PCP or Specialist Appt within 3-5 Days - - -  HRI or [place] - - -  Palliative Care Screening - - -  Medication Review (RN Care Manager) Complete Complete -  PCP or Specialist appointment within 3-5 days of discharge (No Data) Complete Complete  HRI or Home Care Consult Complete - Complete  SW Recovery Care/Counseling Consult - Complete Complete  Palliative Care Screening - Not Applicable Not [place] Not Applicable - Patient Refused  Some recent data might be hidden

## 2020-10-02 NOTE — ED Notes (Signed)
Pt noted to be sitting in chair. Food and drink on table.

## 2020-10-02 NOTE — ED Notes (Signed)
VOLUNTARY/pending placement

## 2020-10-02 NOTE — ED Notes (Signed)
Pt walked to bathroom and then walked to turn off lights and is now in bed, lights off and resting quietly.

## 2020-10-03 ENCOUNTER — Emergency Department: Payer: Medicare HMO

## 2020-10-03 LAB — RESP PANEL BY RT-PCR (FLU A&B, COVID) ARPGX2
Influenza A by PCR: NEGATIVE
Influenza B by PCR: NEGATIVE
SARS Coronavirus 2 by RT PCR: NEGATIVE

## 2020-10-03 MED ORDER — ACETAMINOPHEN 500 MG PO TABS
1000.0000 mg | ORAL_TABLET | Freq: Once | ORAL | Status: AC
  Administered 2020-10-03: 1000 mg via ORAL
  Filled 2020-10-03: qty 2

## 2020-10-03 NOTE — ED Provider Notes (Signed)
-----------------------------------------   8:03 AM on 10/03/2020 -----------------------------------------   Blood pressure 134/76, pulse (!) 54, temperature 97.6 F (36.4 C), temperature source Oral, resp. rate 18, height 1.626 m (5' 4" ), weight 54 kg, SpO2 97 %.  The patient is calm and cooperative at this time.  No acute events overnight other than a mild headache for which we gave him Tylenol.  Still awaiting placement by TOC.   Hinda Kehr, MD 10/03/20 [phone]

## 2020-10-03 NOTE — ED Notes (Signed)
Gave pt chocolate ice cream.

## 2020-10-03 NOTE — ED Notes (Signed)
Pt states he has a headache and is asking for pain medications. Provider notified.

## 2020-10-03 NOTE — ED Notes (Signed)
Pt provided meal tray

## 2020-10-03 NOTE — NC FL2 (Signed)
[place] LEVEL OF CARE SCREENING TOOL     IDENTIFICATION  Patient Name: Douglas Peterson. Birthdate: 1950/11/16 Sex: male Admission Date (Current Location): 09/19/2020  [place] and [place] Number:  Engineering geologist and Address:  [facility], [address], [place], [place] [address]      Provider Number: [id number]  Attending Physician Name and Address:  No att. providers found  Relative Name and Phone Number:  Wenda Overland COunty APS (legal guardian) [phone]    Current Level of Care: Hospital Recommended Level of Care: [place] Prior Approval Number:    Date Approved/Denied:   PASRR Number: [id number]  Discharge Plan: Other (Comment) (ALF)    Current Diagnoses: Patient Active Problem List   Diagnosis Date Noted  . SIRS (systemic inflammatory response syndrome) ([place]) 08/05/2020  . GERD (gastroesophageal reflux disease) 06/05/2020  . Alcohol use disorder, severe, dependence ([place]) 05/28/2020  . Paroxysmal A-fib ([place]) 03/17/2020  . Severe protein-calorie malnutrition ([place]) 02/25/2020  . Left inguinal hernia 01/31/2020  . Encounter for competency evaluation   . Pancytopenia ([place])   . Alcoholic cirrhosis of liver without ascites ([place])   . Thrombocytopenia ([place])   . 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 Alcohol withdrawal delirium, acute, hyperactive ([place]) 09/21/2019  . Pressure injury of ankle, stage 1 08/15/2019  . Palliative care by specialist   . Alcohol withdrawal ([place]) 03/20/2019  . Delirium tremens ([place]) 03/08/2018  . HTN (hypertension) 09/16/2017    Orientation RESPIRATION BLADDER Height & Weight     Self, Place, Situation  Normal Continent Weight: 119 lb 0.8 oz (54 kg) Height:  5' 4"  (162.6 cm)  BEHAVIORAL SYMPTOMS/MOOD NEUROLOGICAL BOWEL NUTRITION STATUS      Continent Diet  AMBULATORY STATUS COMMUNICATION OF NEEDS Skin   Independent  Verbally Normal                       Personal Care Assistance Level of Assistance  Bathing, Feeding, Dressing, Total care Bathing Assistance: Independent Feeding assistance: Independent Dressing Assistance: Independent Total Care Assistance: Independent   Functional Limitations Info  Sight, Hearing, Speech Sight Info: Adequate Hearing Info: Adequate Speech Info: Adequate    SPECIAL CARE FACTORS FREQUENCY  PT (By licensed PT), OT (By licensed OT)     PT Frequency: 5x a week OT Frequency: 5x a week            Contractures Contractures Info: Present (right hand)    Additional Factors Info  Code Status, Allergies Code Status Info: Full Code Allergies Info: hydrochlorothiazide           Current Medications (10/03/2020):  This is the current hospital active medication list Current Facility-Administered Medications  Medication Dose Route Frequency Provider Last Rate Last Admin  . atenolol (TENORMIN) tablet 25 mg  25 mg Oral BID Lucrezia Starch, MD   25 mg at 10/03/20 1029  . baclofen (LIORESAL) tablet 10 mg  10 mg Oral TID Lucrezia Starch, MD   10 mg at 10/03/20 1028  . PARoxetine (PAXIL) tablet 30 mg  30 mg Oral Daily Lucrezia Starch, MD   30 mg at 10/03/20 1029  . polyethylene glycol (MIRALAX / GLYCOLAX) packet 17 g  17 g Oral Daily Blake Divine, MD   17 g at 10/03/20 1029  . thiamine tablet 100 mg  100 mg Oral Daily Lucrezia Starch, MD   100 mg at 10/03/20 1028  Or  . thiamine (B-1) injection 100 mg  100 mg Intravenous Daily Lucrezia Starch, MD       Current Outpatient Medications  Medication Sig Dispense Refill  . Ascorbic Acid (VITAMIN C) 1000 MG tablet Take 1,000 mg by mouth daily.    [name] aspirin EC 81 MG tablet Take 81 mg by mouth daily. Swallow whole.    [name] atenolol (TENORMIN) 25 MG tablet Take 25 mg by mouth 2 (two) times daily.     . baclofen (LIORESAL) 10 MG tablet Take 10 mg by mouth 3 (three) times daily.    . calcium-vitamin D (OSCAL WITH D)  500-200 MG-UNIT tablet Take 1 tablet by mouth daily with breakfast.    . Cholecalciferol 125 MCG (5000 UT) TABS Take 5,000 Units by mouth daily.    [name] doxepin (SINEQUAN) 10 MG capsule Take 10 mg by mouth at bedtime as needed (sleep).     . feeding supplement, ENSURE ENLIVE, (ENSURE ENLIVE) LIQD Take 237 mLs by mouth 2 (two) times daily between meals. 329 mL 12  . folic acid (FOLVITE) 1 MG tablet Take 1 tablet (1 mg total) by mouth daily. 30 tablet 0  . hydrOXYzine (ATARAX/VISTARIL) 25 MG tablet Take 25 mg by mouth 3 (three) times daily as needed for anxiety or itching.     . Multiple Vitamin (MULTIVITAMIN WITH MINERALS) TABS tablet Take 1 tablet by mouth daily. 30 tablet 0  . PARoxetine (PAXIL) 30 MG tablet Take 30 mg by mouth daily.    [name] senna-docusate (SENOKOT-S) 8.6-50 MG tablet Take 2 tablets by mouth at bedtime. 30 tablet 1  . thiamine 100 MG tablet Take 1 tablet (100 mg total) by mouth daily. 30 tablet 0  . acetaminophen (TYLENOL) 500 MG tablet Take 1 tablet (500 mg total) by mouth every 8 (eight) hours as needed for mild pain or fever. 15 tablet 0     Discharge Medications: Please see discharge summary for a list of discharge medications.  Relevant Imaging Results:  Relevant Lab Results:   Additional Information SS# [id number]  Adelene Amas, LCSWA

## 2020-10-03 NOTE — ED Notes (Signed)
Pt sitting in chair, no complaints.

## 2020-10-03 NOTE — ED Notes (Signed)
Pt ambulating with this RN in the hallway. Pt ambulatory with steady gait with cane.

## 2020-10-03 NOTE — ED Notes (Signed)
Dietary sent pt boxed meal tray. This RN called dietary to send hot tray to ED.

## 2020-10-03 NOTE — ED Notes (Signed)
Pt provided with water

## 2020-10-03 NOTE — TOC Progression Note (Signed)
Transition of Care (TOC) - Progression Note    Patient Details  Name: Douglas Peterson. MRN: [id number] Date of Birth: Jan 21, 1951  Transition of Care [facility]) CM/SW [place], [place] Phone Number: [phone] 10/03/2020, 12:00 PM  Clinical Narrative:     CSW spoke with patient about group home placement.  [name] adamant but calmer that he doesn't want to go to a group home and he wants to return home. CSW reminded patient that APS is his guardian and he would need to speak to his APS SWK about returning home but at the moment, this CSW was told by guardian to assist in finding placement in a group home or ALF.  Patient complained about "you're doing this to me" and "I want to be reassessed"  CSW reminded patient the guardianship process took time to process and we would not be able to revisit that from the hospital.  CSW also reminded patient he was given paperwork about guardianship hearing when he was here in the hospital in October.  Patient stated he did not remember that.  CSW reminded him the sheriff came and was dressed in plain clothes when he handed him the paperwork.  Patient stated he would rather go to an ALF than  Group home,  patient state he would like to go to [place].  CSW stated she would reach out to [place] guardian and Lattie Haw at [place] about placement.  Patient verbalized understanding.  CSW contact Lattie Haw at [place] and [place] guardian and left voicemail requesting return call.   Expected Discharge Plan: Skilled Nursing Facility Barriers to Discharge: Continued Medical Work up  Expected Discharge Plan and Services Expected Discharge Plan: [place]   Discharge Planning Services: CM Consult   Living arrangements for the past 2 months: Single Family Home                                       Social Determinants of Health (SDOH) Interventions    Readmission Risk Interventions Readmission Risk Prevention Plan 08/23/2020  08/19/2020 08/14/2020  Transportation Screening Complete Complete Complete  PCP or Specialist Appt within 3-5 Days - - -  HRI or [place] - - -  Palliative Care Screening - - -  Medication Review (RN Care Manager) Complete Complete -  PCP or Specialist appointment within 3-5 days of discharge (No Data) Complete Complete  HRI or Home Care Consult Complete - Complete  SW Recovery Care/Counseling Consult - Complete Complete  Palliative Care Screening - Not Applicable Not [place] Not Applicable - Patient Refused  Some recent data might be hidden

## 2020-10-03 NOTE — ED Notes (Signed)
Report received from [facility]. Patient care assumed. Patient/RN introduction complete. Will continue to monitor.

## 2020-10-03 NOTE — TOC Progression Note (Signed)
Transition of Care (TOC) - Progression Note    Patient Details  Name: Douglas Peterson. MRN: [id number] Date of Birth: 09-17-1951  Transition of Care [facility]) CM/SW [place], [place] Phone Number: [phone] 10/03/2020, 2:51 PM  Clinical Narrative:     Lattie Haw from [facility] will come to assess patient on 10/04/2020.  Expected Discharge Plan: Skilled Nursing Facility Barriers to Discharge: Continued Medical Work up  Expected Discharge Plan and Services Expected Discharge Plan: [place]   Discharge Planning Services: CM Consult   Living arrangements for the past 2 months: Single Family Home                                       Social Determinants of Health (SDOH) Interventions    Readmission Risk Interventions Readmission Risk Prevention Plan 08/23/2020 08/19/2020 08/14/2020  Transportation Screening Complete Complete Complete  PCP or Specialist Appt within 3-5 Days - - -  HRI or [place] - - -  Palliative Care Screening - - -  Medication Review (RN Care Manager) Complete Complete -  PCP or Specialist appointment within 3-5 days of discharge (No Data) Complete Complete  HRI or Home Care Consult Complete - Complete  SW Recovery Care/Counseling Consult - Complete Complete  Palliative Care Screening - Not Applicable Not [place] Not Applicable - Patient Refused  Some recent data might be hidden

## 2020-10-03 NOTE — ED Notes (Signed)
Pt asleep at this time, vitals to be obtained when awake.

## 2020-10-03 NOTE — ED Notes (Signed)
Pt given meal tray.

## 2020-10-04 MED ORDER — ACETAMINOPHEN 325 MG PO TABS
650.0000 mg | ORAL_TABLET | Freq: Once | ORAL | Status: AC
  Administered 2020-10-04: 650 mg via ORAL

## 2020-10-04 MED ORDER — ACETAMINOPHEN 325 MG PO TABS
ORAL_TABLET | ORAL | Status: AC
  Filled 2020-10-04: qty 2

## 2020-10-04 NOTE — ED Notes (Signed)
Pt provided dinner tray, able to feed self.

## 2020-10-04 NOTE — ED Notes (Signed)
In chair resting with no complaints.

## 2020-10-04 NOTE — Evaluation (Signed)
Occupational Therapy Re-Evaluation Patient Details Name: Douglas Peterson. MRN: [id number] DOB: 29-May-1951 Today's Date: 10/04/2020    History of Present Illness Douglas Peterson. is a 00TMA who comes to [facility] on 10/16 after fall at home. Pt found by EMS in his car with blood on back of head, pt reportedly fell in garage earlier in day, hitting head and buttocks. 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 Pt recently DC back to home on 10/12 after SIRS admission.   Clinical Impression   [name] was seen for OT re-evaluation this date 2/2 prolonged hospital stay and goals met/updated. Pt presents to acute OT demonstrating impaired ADL performance and functional mobility 2/2 decreased activity tolerance, functional strength/balance deficits, and poor insight into deficits. Pt accurately states month and length of time in hospital. Continues to state he would not drink and be safe to d/c home alone however given pt's extensive re-admisssion hx / falls hx that is unlikely. Pt currently requires SUPERVISION don B socks and shoes seated EOB - assist to tie laces. CGA + SPC for toilet t/f and ~150 ft mobility. MIN VCs hand washing in sitting - cues for thoroughness of R hand 2/2 tone, pt educated on prevention of skin breakdown and given washcloth roll. Pt instructed in AAROM HEP for RUE spasticity. Pt would benefit from skilled OT to address noted impairments and functional limitations (see below for any additional details) in order to maximize safety and independence while minimizing falls risk and caregiver burden. Upon hospital discharge, recommend HHOT to maximize pt safety and return to functional independence during meaningful occupations of daily life.     Follow Up Recommendations  Home health OT;Supervision/Assistance - 24 hour    Equipment Recommendations  None recommended by OT    Recommendations for Other Services        Precautions / Restrictions Precautions Precautions: Fall Precaution Comments: old right clavicular fracture Required Braces or Orthoses:  (no sling needed as of 10/21 per Dr. Mack Guise) Restrictions Weight Bearing Restrictions: Yes RUE Weight Bearing: Weight bearing as tolerated Other Position/Activity Restrictions: Rt clavicle fx on 06/05/20      Mobility Bed Mobility Overal bed mobility: Needs Assistance Bed Mobility: Supine to Sit     Supine to sit: Supervision     General bed mobility comments: Increased time + bed rail use; mild L lean noted    Transfers Overall transfer level: Needs assistance Equipment used: Straight cane Transfers: Sit to/from Stand Sit to Stand: Min guard         General transfer comment: Fair eccentric and concentric control and stability    Balance Overall balance assessment: Needs assistance;History of Falls Sitting-balance support: Single extremity supported;Feet supported Sitting balance-Leahy Scale: Good   Postural control: Left lateral lean Standing balance support: During functional activity;Single extremity supported;No upper extremity supported Standing balance-Leahy Scale: Good          ADL either performed or assessed with clinical judgement   ADL Overall ADL's : Needs assistance/impaired      General ADL Comments: SUPERVISION don B socks and shoes seated EOB - assist to tie laces. CGA + SPC for toilet t/f. MIN VCs hand washing in sitting - cues for thoroughness of R hand 2/2 tone                  Pertinent Vitals/Pain Pain Assessment: No/denies pain     Hand Dominance Left  Extremity/Trunk Assessment Upper Extremity Assessment Upper Extremity Assessment: RUE deficits/detail RUE Deficits / Details: chronic RUE spastic hemiplegia. Recent clavicular fx 06/05/20   Lower Extremity Assessment Lower Extremity Assessment: Generalized weakness       Communication Communication Communication: No  difficulties   Cognition Arousal/Alertness: Awake/alert Behavior During Therapy: WFL for tasks assessed/performed Overall Cognitive Status: Within Functional Limits for tasks assessed        General Comments: Pt accurately states month and length of time in hospital. Continues to state he would not drink and be safe to d/c home alone which given pt's extensive re-admisssion hx is unlikely    General Comments       Exercises Exercises: Other exercises Other Exercises Other Exercises: Pt educated re: OT role, DME recs, d/c recs, falls prevention, ECS, skin integrity, importance of mobility for functional strengthening Other Exercises: LBD, hand washing, sup>sit, sit<>stand, sitting/standing balance/toelrance, ~150 ft mobility   Shoulder Instructions      Home Living Family/patient expects to be discharged to:: Private residence Living Arrangements: Alone Available Help at Discharge: Family Type of Home: House Home Access: Stairs to enter [employer] of Steps: 3 with no rails (from garage) or 5 with bilateral rails (from front entrance) Entrance Stairs-Rails: Right;Left;Can reach both Home Layout: One level     Bathroom Shower/Tub: Teacher, early years/pre: Standard     Home Equipment: Cane - single point;Cane - quad;Walker - 2 wheels;Tub bench;Grab bars - tub/shower;Shower seat;Grab bars - toilet          Prior Functioning/Environment Level of Independence: Independent with assistive device(s)  Gait / Transfers Assistance Needed: Primarily uses SPC at baseline. Pt endorses he has a cane in his car, garage, and house. Pt with multiple past admissions secondary to falls.  Pt reports being Ind with ADLs ADL's / Homemaking Assistance Needed: Pt c multiple past admissions 2/2 falls. Lives alone, per pt still driving, endorses multiple falls in past week (since most recent DC).            OT Problem List: Decreased strength;Decreased activity  tolerance;Decreased safety awareness;Impaired balance (sitting and/or standing)      OT Treatment/Interventions: Self-care/ADL training;Therapeutic exercise;Therapeutic activities;Cognitive remediation/compensation;Energy conservation;Visual/perceptual remediation/compensation;DME and/or AE instruction;Patient/family education;Balance training    OT Goals(Current goals can be found in the care plan section) Acute Rehab OT Goals Patient Stated Goal: To go home OT Goal Formulation: With patient Time For Goal Achievement: 10/18/20 Potential to Achieve Goals: Fair ADL Goals Pt Will Perform Grooming: with modified independence;standing (c LRAD PRN) Pt Will Perform Lower Body Dressing: with modified independence;sit to/from stand (c LRAD PRN) Pt Will Transfer to Toilet: with modified independence;ambulating;regular height toilet (c LRAD PRN) Pt Will Perform Toileting - Clothing Manipulation and hygiene: with modified independence;sitting/lateral leans  OT Frequency: Min 1X/week   Barriers to D/C: Decreased caregiver support;Inaccessible home environment             AM-PAC OT "6 Clicks" Daily Activity     Outcome Measure Help from another person eating meals?: None Help from another person taking care of personal grooming?: A Little Help from another person toileting, which includes using toliet, bedpan, or urinal?: A Little Help from another person bathing (including washing, rinsing, drying)?: A Little Help from another person to put on and taking off regular upper body clothing?: A Little Help from another person to put on and taking off regular lower body clothing?: A Little 6 Click Score: 19   End of Session Equipment Utilized  During Treatment: Gait belt Nurse Communication: Mobility status  Activity Tolerance: Patient tolerated treatment well Patient left: in chair;with call bell/phone within reach  OT Visit Diagnosis: Other abnormalities of gait and mobility (R26.89);Muscle  weakness (generalized) (M62.81);Repeated falls (R29.6)                Time: 6468-0321 OT Time Calculation (min): 19 min Charges:  OT General Charges $OT Visit: 1 Visit OT Evaluation $OT Re-eval: 1 Re-eval OT Treatments $Self Care/Home Management : 8-22 mins  Dessie Coma, M.S. OTR/L  10/04/20, 9:51 AM  ascom [phone]

## 2020-10-04 NOTE — ED Notes (Signed)
VOLUNTARY/pending placement

## 2020-10-04 NOTE — Progress Notes (Signed)
Physical Therapy Treatment Patient Details Name: Douglas Peterson. MRN: [id number] DOB: Mar 27, 1951 Today's Date: 10/04/2020    History of Present Illness Douglas Peterson. is a 14ERX who comes to [facility] on 10/16 after fall at home. Pt found by EMS in his car with blood on back of head, pt reportedly fell in garage earlier in day, hitting head and buttocks. 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 Pt recently DC back to home on 10/12 after SIRS admission.    PT Comments    Pt was pleasant and motivated to participate during the session.  Pt presented with occasional R foot drag during swing through with tennis shoes donned but was able to self-correct and avoid LOB without physical assistance.  Pt put forth good effort with static and dynamic standing balance training with occasional min to mod A to prevent LOB most notably in narrow semi-tandem stance and during activities with eyes closed/head turns.  Pt goals updated.  Pt will benefit from HHPT services upon discharge to safely address deficits listed in patient problem list for decreased caregiver assistance and eventual return to PLOF.     Follow Up Recommendations  Home health PT;Supervision/Assistance - 24 hour     Equipment Recommendations  None recommended by PT    Recommendations for Other Services       Precautions / Restrictions Precautions Precautions: Fall Precaution Comments: old right clavicular fracture Required Braces or Orthoses:  (no sling needed as of 10/21 per Dr. Mack Guise) Restrictions Weight Bearing Restrictions: Yes RUE Weight Bearing: Weight bearing as tolerated Other Position/Activity Restrictions: Rt clavicle fx on 06/05/20    Mobility  Bed Mobility Overal bed mobility: Modified Independent Bed Mobility: Supine to Sit     Supine to sit: Supervision     General bed mobility comments: Extra time and effort but no physical  assist or use of bed rail required  Transfers Overall transfer level: Needs assistance Equipment used: Straight cane Transfers: Sit to/from Stand Sit to Stand: Supervision         General transfer comment: Fair eccentric and concentric control and stability  Ambulation/Gait Ambulation/Gait assistance: Min guard Gait Distance (Feet): 350 Feet Assistive device: Straight cane Gait Pattern/deviations: Step-through pattern;Decreased step length - right;Decreased step length - left;Trunk flexed Gait velocity: decreased   General Gait Details: Occasional R foot drag during swing through but pt able to self correct without LOB; good sequencing with the [facility]   Stairs             Wheelchair Mobility    Modified Rankin (Stroke Patients Only)       Balance Overall balance assessment: Needs assistance;History of Falls Sitting-balance support: Single extremity supported;Feet supported Sitting balance-Leahy Scale: Good   Postural control: Left lateral lean Standing balance support: During functional activity;Single extremity supported;No upper extremity supported Standing balance-Leahy Scale: Good                              Cognition Arousal/Alertness: Awake/alert Behavior During Therapy: WFL for tasks assessed/performed Overall Cognitive Status: Within Functional Limits for tasks assessed                                 General Comments: Pt accurately states month and length of time in hospital. Continues to state he would not drink  and be safe to d/c home alone which given pt's extensive re-admisssion hx is unlikely       Exercises Other Exercises Other Exercises: Static and dynamic standing balance training without UE support with various foot positions, reaching activities outside BOS, and combinations of eyes open/closed and head still/head turns Other Exercises: LBD, hand washing, sup>sit, sit<>stand, sitting/standing balance/toelrance,  ~150 ft mobility    General Comments        Pertinent Vitals/Pain Pain Assessment: No/denies pain    Home Living Family/patient expects to be discharged to:: Private residence Living Arrangements: Alone Available Help at Discharge: Family Type of Home: House Home Access: Stairs to enter Entrance Stairs-Rails: Right;Left;Can reach both Home Layout: One level Home Equipment: [place] - single point;Cane - quad;Walker - 2 wheels;Tub bench;Grab bars - tub/shower;Shower seat;Grab bars - toilet      Prior Function Level of Independence: Independent with assistive device(s)  Gait / Transfers Assistance Needed: Primarily uses SPC at baseline. Pt endorses he has a cane in his car, garage, and house. Pt with multiple past admissions secondary to falls.  Pt reports being Ind with ADLs ADL's / Homemaking Assistance Needed: Pt c multiple past admissions 2/2 falls. Lives alone, per pt still driving, endorses multiple falls in past week (since most recent DC).     PT Goals (current goals can now be found in the care plan section) Acute Rehab PT Goals Patient Stated Goal: To go home Progress towards PT goals: Progressing toward goals    Frequency    Min 2X/week      PT Plan Current plan remains appropriate    Co-evaluation              AM-PAC PT "6 Clicks" Mobility   Outcome Measure  Help needed turning from your back to your side while in a flat bed without using bedrails?: None Help needed moving from lying on your back to sitting on the side of a flat bed without using bedrails?: None Help needed moving to and from a bed to a chair (including a wheelchair)?: A Little Help needed standing up from a chair using your arms (e.g., wheelchair or bedside chair)?: A Little Help needed to walk in hospital room?: A Little Help needed climbing 3-5 steps with a railing? : A Little 6 Click Score: 20    End of Session Equipment Utilized During Treatment: Gait belt Activity Tolerance:  Patient tolerated treatment well Patient left: in chair Nurse Communication: Mobility status PT Visit Diagnosis: Unsteadiness on feet (R26.81);Repeated falls (R29.6);Muscle weakness (generalized) (M62.81);History of falling (Z91.81);Difficulty in walking, not elsewhere classified (R26.2)     Time: 3846-6599 PT Time Calculation (min) (ACUTE ONLY): 23 min  Charges:  $Gait Training: 8-22 mins $Therapeutic Exercise: 8-22 mins                     D. Scott  PT, DPT 10/04/20, 1:21 PM

## 2020-10-04 NOTE — ED Notes (Signed)
Pt has a guardian who will make his decisions ([place] [employer]).  Await SNF placement.  Pt is resting in recliner and not in any distress at this time.

## 2020-10-04 NOTE — ED Notes (Signed)
Pt is sitting in recliner, he seems to be in good spirits.  Cleaned up his room a bit for him discarding empty wrappers and cups.  Brought pt a coffee per his request (gave decaf due to time of day).  Pt has his cell phone with him and is also watching the news

## 2020-10-05 NOTE — ED Notes (Signed)
Report received from [facility]. Patient care assumed. Patient/RN introduction complete. Will continue to monitor.

## 2020-10-05 NOTE — ED Notes (Signed)
Hourly rounding revealed patient in room. No complaints, stable and in no acute distress. Q15 rounds and monitoring via Engineer, drilling to continue.

## 2020-10-05 NOTE — ED Notes (Signed)
Social worker at bedside with patient.

## 2020-10-05 NOTE — ED Provider Notes (Signed)
-----------------------------------------   5:44 AM on 10/05/2020 -----------------------------------------   Blood pressure (!) 126/58, pulse 62, temperature 98.7 F (37.1 C), temperature source Oral, resp. rate 16, height 5' 4"  (1.626 m), weight 54 kg, SpO2 96 %.  The patient is calm and cooperative at this time.  There have been no acute events since the last update.  Awaiting SNF placement from social work team.   Paulette Blanch, MD 10/05/20 [phone]

## 2020-10-05 NOTE — ED Notes (Signed)
Pt resting comfortably with eyes closed, will continue to monitor.

## 2020-10-05 NOTE — ED Notes (Signed)
Hourly rounding revealed patient in room. No complaints, stable, in no acute distress. Q15 minute rounds and monitoring via Engineer, drilling to continue.

## 2020-10-05 NOTE — ED Notes (Signed)
Hourly rounding revealed patient in room. No complaints, stable, in no acute distress. Q15 minute rounds and monitoring via Engineer, drilling to be continued.

## 2020-10-05 NOTE — ED Notes (Signed)
Assumed care of patient resting comfortably in chair. Given beverage, no complaints

## 2020-10-05 NOTE — ED Notes (Signed)
Pt ambulatory to chair from bed. Pt provided warm blanket. Pt denies any further needs at this time.

## 2020-10-05 NOTE — ED Notes (Signed)
Pt given lunch tray.

## 2020-10-05 NOTE — TOC Progression Note (Addendum)
Transition of Care (TOC) - Progression Note    Patient Details  Name: Douglas Peterson. MRN: [id number] Date of Birth: May 07, 1951  Transition of Care [facility] ) CM/SW [place], RN Phone Number: 10/05/2020, 3:06 PM  Clinical Narrative:     Left message for Butch Penny at [facility] at [place] to return call.  Checking bed availability for this patient   Received call from Butch Penny, she will review fl2 and notes I have sent.   Expected Discharge Plan: Skilled Nursing Facility Barriers to Discharge: Continued Medical Work up  Expected Discharge Plan and Services Expected Discharge Plan: [place]   Discharge Planning Services: CM Consult   Living arrangements for the past 2 months: Single Family Home                                       Social Determinants of Health (SDOH) Interventions    Readmission Risk Interventions Readmission Risk Prevention Plan 08/23/2020 08/19/2020 08/14/2020  Transportation Screening Complete Complete Complete  PCP or Specialist Appt within 3-5 Days - - -  HRI or [place] - - -  Palliative Care Screening - - -  Medication Review (RN Care Manager) Complete Complete -  PCP or Specialist appointment within 3-5 days of discharge (No Data) Complete Complete  HRI or Home Care Consult Complete - Complete  SW Recovery Care/Counseling Consult - Complete Complete  Palliative Care Screening - Not Applicable Not [place] Not Applicable - Patient Refused  Some recent data might be hidden

## 2020-10-05 NOTE — ED Notes (Signed)
Pt provided with breakfast tray.

## 2020-10-06 NOTE — ED Notes (Signed)
Pt given lunch tray. Pt refused the tray that was sent. Pt stated to this tech "that is not what I ordered."

## 2020-10-06 NOTE — ED Provider Notes (Signed)
Today's Vitals   10/06/20 0300 10/06/20 0500 10/06/20 0555 10/06/20 0626  BP:   120/68   Pulse:   (!) 50   Resp:   13   Temp:      TempSrc:      SpO2:   98%   Weight:      Height:      PainSc: 0-No pain 0-No pain  0-No pain   Body mass index is 20.43 kg/m.  Patient calm and cooperative throughout the night.  No acute events.  Continues to await SNF placement with our social work team.   Vladimir Crofts, MD 10/06/20 [phone]

## 2020-10-06 NOTE — ED Notes (Signed)
Baclofen med requested from pharm

## 2020-10-06 NOTE — ED Notes (Signed)
Breakfast tray provided. 

## 2020-10-06 NOTE — ED Notes (Signed)
Pt assisted with full bed change, bathing, shaving and change of clothes provided.

## 2020-10-06 NOTE — ED Notes (Signed)
Assumed care of pt at 2300, pt sleeping however awoke to RN voice, up ad lib to restroom with cane and RN observation with non-skid socks in place. Denies pain or concerns at this time. Medicated per MAR. AO x4, side rails up x3 in hospital bed.

## 2020-10-06 NOTE — ED Notes (Signed)
[name] vol pending placement

## 2020-10-07 NOTE — ED Notes (Signed)
Hand hygiene performed and pt given supper

## 2020-10-07 NOTE — ED Notes (Addendum)
Hand hygiene performed and lunch tray given  Pt ambulates in room unassisted and uses the restroom when he needs to without prompting

## 2020-10-07 NOTE — ED Notes (Signed)
Pt sleeping. Will assess vitals when awake. Equal and unlabored breathing noted.

## 2020-10-07 NOTE — ED Notes (Signed)
Hand hygiene performed - Pt given breakfast tray

## 2020-10-07 NOTE — TOC Progression Note (Signed)
Transition of Care (TOC) - Progression Note    Patient Details  Name: Douglas Peterson. MRN: [id number] Date of Birth: 11/10/1950  Transition of Care [facility]) CM/SW [place], LCSW Phone Number: 10/07/2020, 7:59 AM  Clinical Narrative:     The patient is scheduled to be discharged on date pending group home search.  Patient guardian: [facility] Department of Social Services Case worker: Gwynneth Munson [phone]   Mos recent [place] notes indicates search for bed at [facility], [place].    Expected Discharge Plan: Skilled Nursing Facility Barriers to Discharge: Continued Medical Work up  Expected Discharge Plan and Services Expected Discharge Plan: [place]   Discharge Planning Services: CM Consult   Living arrangements for the past 2 months: Single Family Home                      Social Determinants of Health (SDOH) Interventions    Readmission Risk Interventions Readmission Risk Prevention Plan 08/23/2020 08/19/2020 08/14/2020  Transportation Screening Complete Complete Complete  PCP or Specialist Appt within 3-5 Days - - -  HRI or [place] - - -  Palliative Care Screening - - -  Medication Review (RN Care Manager) Complete Complete -  PCP or Specialist appointment within 3-5 days of discharge (No Data) Complete Complete  HRI or Home Care Consult Complete - Complete  SW Recovery Care/Counseling Consult - Complete Complete  Palliative Care Screening - Not Applicable Not [employer] Not Applicable - Patient Refused  Some recent data might be hidden

## 2020-10-07 NOTE — ED Notes (Signed)
Pt assisted into bed from bedside

## 2020-10-07 NOTE — ED Notes (Signed)
Pt to recliner drinking coffee.

## 2020-10-07 NOTE — ED Provider Notes (Signed)
Emergency Medicine Observation Re-evaluation Note  Douglas Peterson. is a 69 y.o. male, seen on rounds today.  Pt initially presented to the ED for complaints of Alcohol Intoxication Currently, the patient is calm, in NAD.  Physical Exam  BP 132/80   Pulse 78   Temp 97.6 F (36.4 C) (Oral)   Resp 16   Ht 5' 4"  (1.626 m)   Wt 54 kg   SpO2 98%   BMI 20.43 kg/m  Physical Exam General: NAD Cardiac: Well perfused Lungs: Normal WOB Psych: Calm, resting  ED Course / MDM  EKG:    I have reviewed the labs performed to date as well as medications administered while in observation.  Recent changes in the last 24 hours include none.  Plan  Current plan is for social work disposition and placement.. [name] not under full IVC at this time.   Duffy Bruce, MD 10/07/20 1616

## 2020-10-07 NOTE — ED Notes (Signed)
Breakfast tray given. °

## 2020-10-07 NOTE — ED Notes (Signed)
Vol soc work placement

## 2020-10-08 MED ORDER — ACETAMINOPHEN 500 MG PO TABS
1000.0000 mg | ORAL_TABLET | Freq: Four times a day (QID) | ORAL | Status: DC | PRN
  Administered 2020-10-08 – 2020-10-12 (×2): 1000 mg via ORAL
  Filled 2020-10-08 (×2): qty 2

## 2020-10-08 NOTE — Progress Notes (Signed)
Physical Therapy Treatment Patient Details Name: Douglas Peterson. MRN: [id number] DOB: February 08, 1951 Today's Date: 10/08/2020    History of Present Illness Douglas Peterson. is a 83TDV who comes to [facility] on 10/16 after fall at home. Pt found by EMS in his car with blood on back of head, pt reportedly fell in garage earlier in day, hitting head and buttocks. 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 Pt recently DC back to home on 10/12 after SIRS admission.    PT Comments    Pt OOB, dons shoes, to commode with supervision.  Typically walks in room unassisted during the day.  He is able to compete 6 laps around both nursing station pods on ED with SPC and min guard/supervision.  Generally steady and able to correct imbalances without assist.     Follow Up Recommendations  Home health PT;Supervision/Assistance - 24 hour     Equipment Recommendations  None recommended by PT    Recommendations for Other Services       Precautions / Restrictions Precautions Precautions: Fall Precaution Comments: old right clavicular fracture Restrictions Weight Bearing Restrictions: Yes RUE Weight Bearing: Weight bearing as tolerated Other Position/Activity Restrictions: Rt clavicle fx on 06/05/20    Mobility  Bed Mobility Overal bed mobility: Modified Independent                Transfers Overall transfer level: Modified independent Equipment used: Straight cane                Ambulation/Gait Ambulation/Gait assistance: Modified independent (Device/Increase time);Supervision Gait Distance (Feet): 2100 Feet Assistive device: Straight cane Gait Pattern/deviations: Step-through pattern;Decreased step length - right;Decreased step length - left;Trunk flexed Gait velocity: decreased   General Gait Details: Occasional R foot drag during swing through but pt able to self correct without LOB; good sequencing with  the [facility]   Stairs             Wheelchair Mobility    Modified Rankin (Stroke Patients Only)       Balance Overall balance assessment: Needs assistance;History of Falls   Sitting balance-Leahy Scale: Good     Standing balance support: During functional activity;Single extremity supported;No upper extremity supported Standing balance-Leahy Scale: Good                              Cognition Arousal/Alertness: Awake/alert Behavior During Therapy: WFL for tasks assessed/performed Overall Cognitive Status: Within Functional Limits for tasks assessed                                        Exercises      General Comments        Pertinent Vitals/Pain Pain Assessment: No/denies pain    Home Living                      Prior Function            PT Goals (current goals can now be found in the care plan section) Progress towards PT goals: Progressing toward goals    Frequency    Min 2X/week      PT Plan Current plan remains appropriate    Co-evaluation              AM-PAC PT "  6 Clicks" Mobility   Outcome Measure  Help needed turning from your back to your side while in a flat bed without using bedrails?: None Help needed moving from lying on your back to sitting on the side of a flat bed without using bedrails?: None Help needed moving to and from a bed to a chair (including a wheelchair)?: None Help needed standing up from a chair using your arms (e.g., wheelchair or bedside chair)?: None Help needed to walk in hospital room?: None Help needed climbing 3-5 steps with a railing? : A Little 6 Click Score: 23    End of Session Equipment Utilized During Treatment: Gait belt Activity Tolerance: Patient tolerated treatment well Patient left: in chair Nurse Communication: Mobility status PT Visit Diagnosis: Unsteadiness on feet (R26.81);Repeated falls (R29.6);Muscle weakness (generalized) (M62.81);History of  falling (Z91.81);Difficulty in walking, not elsewhere classified (R26.2)     Time: 1423-9532 PT Time Calculation (min) (ACUTE ONLY): 24 min  Charges:  $Gait Training: 23-37 mins                    Chesley Noon, PTA 10/08/20, 3:27 PM

## 2020-10-08 NOTE — ED Notes (Signed)
Patient up to recliner.

## 2020-10-08 NOTE — Progress Notes (Signed)
Occupational Therapy Treatment Patient Details Name: Douglas Peterson. MRN: [id number] DOB: 02-20-1951 Today's Date: 10/08/2020    History of present illness Douglas Peterson. is a 63FHL who comes to [facility] on 10/16 after fall at home. Pt found by EMS in his car with blood on back of head, pt reportedly fell in garage earlier in day, hitting head and buttocks. 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 Pt recently DC back to home on 10/12 after SIRS admission.   OT comments  Douglas Peterson was seen for OT treatment on this date. Upon arrival to room pt seated in chair and excited for session. Pt required MIN A wash face and arms standing sinkside - assist for R hand thoroughness 2/2 increased tone. SBA + SPC for ADL t/f and ~500 ft mobility in hallway. Pt demonstrated good safety awareness. Pt demonstrated HEP and completed RUE AAROM (1 set x 10 reps each elbow flex/ext, digit flex/ext). Pt making good progress toward goals. Pt continues to benefit from skilled OT services to maximize return to PLOF and minimize risk of future falls, injury, caregiver burden, and readmission. Will continue to follow POC. Discharge recommendation remains appropriate.    Follow Up Recommendations  Home health OT;Supervision/Assistance - 24 hour    Equipment Recommendations  None recommended by OT    Recommendations for Other Services      Precautions / Restrictions Precautions Precautions: Fall Precaution Comments: old right clavicular fracture Restrictions Weight Bearing Restrictions: Yes RUE Weight Bearing: Weight bearing as tolerated Other Position/Activity Restrictions: Rt clavicle fx on 06/05/20       Mobility Bed Mobility Overal bed mobility: Modified Independent      General bed mobility comments: received and left up in chair  Transfers Overall transfer level: Modified independent Equipment used: Straight cane        Balance Overall balance assessment: Needs assistance;History of Falls Sitting-balance support: Single extremity supported;Feet supported Sitting balance-Leahy Scale: Good     Standing balance support: Single extremity supported;During functional activity Standing balance-Leahy Scale: Good        ADL either performed or assessed with clinical judgement   ADL Overall ADL's : Needs assistance/impaired    General ADL Comments: MIN A wash face and arms standing sinkside - assist for R hand thoroughness 2/2 increased tone. SBA + SPC for ADL t/f                Cognition Arousal/Alertness: Awake/alert Behavior During Therapy: WFL for tasks assessed/performed Overall Cognitive Status: Within Functional Limits for tasks assessed           Exercises Exercises: Other exercises Other Exercises Other Exercises: Pt educated re: HEP, importance of mobility for funcitonal strengthening Other Exercises: hand washing, UB washing, sit<>stand x2, sitting/standing balance/tolerance, ~500 ft mobility, RUE AAROM (1 set x 10 reps each elbow flex/ext, digit flex/ext)           Pertinent Vitals/ Pain       Pain Assessment: No/denies pain         Frequency  Min 1X/week        Progress Toward Goals  OT Goals(current goals can now be found in the care plan section)  Progress towards OT goals: Progressing toward goals  Acute Rehab OT Goals Patient Stated Goal: To go home OT Goal Formulation: With patient Time For Goal Achievement: 10/18/20 Potential to Achieve Goals: Fair ADL Goals Pt Will Perform Grooming: with  modified independence;standing ( c LRAD PRN for improved safety and funcitonal indep.) Pt Will Perform Lower Body Dressing: with modified independence;sit to/from stand ( c LRAD PRN for improved safety and funcitonal indep.) Pt Will Transfer to Toilet: with modified independence;ambulating;regular height toilet ( c LRAD PRN for improved safety and funcitonal indep.) Pt Will  Perform Toileting - Clothing Manipulation and hygiene: with modified independence;sitting/lateral leans ( c LRAD PRN for improved safety and funcitonal indep.) Additional ADL Goal #1: Pt will independently verbalize a plan to implement at least 3 learned falls prevention strategies into his daily routines/home environment for improved safety and functional independence upon hospital DC.  Plan Discharge plan remains appropriate;Frequency remains appropriate       AM-PAC OT "6 Clicks" Daily Activity     Outcome Measure   Help from another person eating meals?: None Help from another person taking care of personal grooming?: A Little Help from another person toileting, which includes using toliet, bedpan, or urinal?: A Little Help from another person bathing (including washing, rinsing, drying)?: A Little Help from another person to put on and taking off regular upper body clothing?: None Help from another person to put on and taking off regular lower body clothing?: A Little 6 Click Score: 20    End of Session Equipment Utilized During Treatment: Other (comment) (SPC)  OT Visit Diagnosis: Other abnormalities of gait and mobility (R26.89);Muscle weakness (generalized) (M62.81);Repeated falls (R29.6)   Activity Tolerance Patient tolerated treatment well   Patient Left in chair;with call bell/phone within reach   Nurse Communication          Time: 0940-7680 OT Time Calculation (min): 25 min  Charges: OT General Charges $OT Visit: 1 Visit OT Treatments $Self Care/Home Management : 8-22 mins $Therapeutic Exercise: 8-22 mins  Douglas Peterson, M.S. OTR/L  10/08/20, 4:37 PM  ascom [phone]

## 2020-10-08 NOTE — ED Provider Notes (Signed)
-----------------------------------------   7:52 AM on 10/08/2020 -----------------------------------------   Blood pressure (!) 137/92, pulse (!) 50, temperature 98.3 F (36.8 C), temperature source Oral, resp. rate 16, height 1.626 m (5' 4" ), weight 54 kg, SpO2 98 %.  The patient is calm and cooperative at this time.  There have been no acute events since the last update.  Given that he has a headache nearly every morning at approximately the same time, I put in an order for acetaminophen 1000 mg p.o. every 6 hours as needed for mild and moderate pain.  Otherwise the patient is stable and awaiting placement.   Hinda Kehr, MD 10/08/20 [phone]

## 2020-10-08 NOTE — TOC Progression Note (Signed)
Transition of Care (TOC) - Progression Note    Patient Details  Name: Douglas Peterson. MRN: [id number] Date of Birth: [birth year]  Transition of Care [facility]) CM/SW [place], [place] Phone Number: [phone] 10/08/2020, 2:50 PM  Clinical Narrative:      Faxed [phone] clinical notes and PT/OT recommendations to [facility] at [place].  Expected Discharge Plan: Skilled Nursing Facility Barriers to Discharge: Continued Medical Work up  Expected Discharge Plan and Services Expected Discharge Plan: [place]   Discharge Planning Services: CM Consult   Living arrangements for the past 2 months: Single Family Home                                       Social Determinants of Health (SDOH) Interventions    Readmission Risk Interventions Readmission Risk Prevention Plan 08/23/2020 08/19/2020 08/14/2020  Transportation Screening Complete Complete Complete  PCP or Specialist Appt within 3-5 Days - - -  HRI or [place] - - -  Palliative Care Screening - - -  Medication Review (RN Care Manager) Complete Complete -  PCP or Specialist appointment within 3-5 days of discharge (No Data) Complete Complete  HRI or Home Care Consult Complete - Complete  SW Recovery Care/Counseling Consult - Complete Complete  Palliative Care Screening - Not Applicable Not [place] Not Applicable - Patient Refused  Some recent data might be hidden

## 2020-10-09 NOTE — ED Notes (Signed)
Pt is notably sleeping. Placed pt dinner tray on bedside table.  lw edt

## 2020-10-09 NOTE — ED Notes (Signed)
Pt to toilet and back to recliner, ambulatory with cane, independent, steady on feet, denies further needs

## 2020-10-09 NOTE — ED Provider Notes (Signed)
Emergency Medicine Observation Re-evaluation Note  Douglas Peterson. is a 69 y.o. male, seen on rounds today.  Pt initially presented to the ED for complaints of Alcohol Intoxication Currently, the patient is awaiting placement  .  Physical Exam  BP (!) 141/89 (BP Location: Left Arm)   Pulse 63   Temp 98.3 F (36.8 C) (Oral)   Resp 18   Ht 5' 4"  (1.626 m)   Wt 54 kg   SpO2 95%   BMI 20.43 kg/m  Physical Exam General: in NAD Cardiac: well perfused Lungs: even and unlabored resp Psych: calm  ED Course / MDM  EKG:    I have reviewed the labs performed to date as well as medications administered while in observation.  Recent changes in the last 24 hours include none.  Plan  Current plan is for awaiting placement.    Douglas Lot, MD 10/09/20 [phone]

## 2020-10-09 NOTE — TOC Progression Note (Signed)
Transition of Care (TOC) - Progression Note    Patient Details  Name: Douglas Peterson. MRN: [id number] Date of Birth: [birth year]  Transition of Care [facility]) CM/SW [place], [place] Phone Number:  [phone] 10/09/2020, 1:05 PM  Clinical Narrative:     CSW left message for [place] at [place] for update on bed offer.  Expected Discharge Plan: Skilled Nursing Facility Barriers to Discharge: Continued Medical Work up  Expected Discharge Plan and Services Expected Discharge Plan: [place]   Discharge Planning Services: CM Consult   Living arrangements for the past 2 months: Single Family Home                                       Social Determinants of Health (SDOH) Interventions    Readmission Risk Interventions Readmission Risk Prevention Plan 08/23/2020 08/19/2020 08/14/2020  Transportation Screening Complete Complete Complete  PCP or Specialist Appt within 3-5 Days - - -  HRI or [place] - - -  Palliative Care Screening - - -  Medication Review (RN Care Manager) Complete Complete -  PCP or Specialist appointment within 3-5 days of discharge (No Data) Complete Complete  HRI or Home Care Consult Complete - Complete  SW Recovery Care/Counseling Consult - Complete Complete  Palliative Care Screening - Not Applicable Not [place] Not Applicable - Patient Refused  Some recent data might be hidden

## 2020-10-09 NOTE — ED Notes (Signed)
Pt calm, cooperative, in a good mood, smiling, sitting in recliner. Pt responsive, responds appropriately. Pt has cane at bedside, phone and phone charger plugged into wall outlet. Pt says "I feel good today, I'm good how are you doing?"  Pt reports that he has been steady on feet with increasing strength and mobility, confirmed with notes from PT/OT. Pt denies further needs. Will obtain update VS shortly

## 2020-10-10 NOTE — ED Notes (Signed)
VOLUNTARY/pending placement

## 2020-10-10 NOTE — TOC Progression Note (Signed)
Transition of Care (TOC) - Progression Note    Patient Details  Name: Douglas Peterson. MRN: [id number] Date of Birth: 02-16-1951  Transition of Care [facility]) CM/SW [place], [place] Phone Number: [phone] 10/10/2020, 1:41 PM  Clinical Narrative:     CSW spoke with patient to update on placement status.  CSW stated I was still looking for ALF placement at [facility] or [place] and I would let the patient know once I received confirmation.  Patient stated he did not want to go to an ALF but wanted to return home.  CSW reminded patient he has a guardian and is unable to return home, and the ALF setting would probably be the safest place for him.  Patient stated he wasn't going to go.  CSW reminded the patient that he would need to go since his guardian wa not allowing hi to return home.  CSW encouraged the patient to consider the ALF as an opportunity to prove to DSS that he is able to manage without drinking alcohol.  CSW also asked patient to consider this may be a good opportunity for the patient to make new friends and not be so isolated as he as been.  [name] not in agreement with CSW suggestion but he did agree he would consider the benefits of going to an ALF.   Expected Discharge Plan: Skilled Nursing Facility Barriers to Discharge: ED Unsafe disposition, Other (comment), Unsafe home situation (pending guardianship paperwork and executor having access to funds, ALF pending bed offer)  Expected Discharge Plan and Services Expected Discharge Plan: [place]   Discharge Planning Services: CM Consult   Living arrangements for the past 2 months: Single Family Home                                       Social Determinants of Health (SDOH) Interventions    Readmission Risk Interventions Readmission Risk Prevention Plan 08/23/2020 08/19/2020 08/14/2020  Transportation Screening Complete Complete Complete  PCP or Specialist Appt within 3-5  Days - - -  HRI or [place] - - -  Palliative Care Screening - - -  Medication Review ([place]) Complete Complete -  PCP or Specialist appointment within 3-5 days of discharge (No Data) Complete Complete  HRI or Home Care Consult Complete - Complete  SW Recovery Care/Counseling Consult - Complete Complete  Palliative Care Screening - Not Applicable Not [place] Not Applicable - Patient Refused  Some recent data might be hidden

## 2020-10-10 NOTE — ED Notes (Signed)
Introduced self to pt. PT given ice cream per request. Hand hygiene performed. PT up to recliner with no assist to eat ice cream.

## 2020-10-10 NOTE — ED Notes (Signed)
Pt ambulated around A and B pods with supervision of this RN. PT did very well using his cane. No assistance was required

## 2020-10-10 NOTE — ED Notes (Signed)
PT through with supper. Playing on his cellphone. NAD.

## 2020-10-10 NOTE — ED Notes (Signed)
Pt given bedtime snack

## 2020-10-10 NOTE — ED Provider Notes (Signed)
Emergency Medicine Observation Re-evaluation Note  Douglas Peterson. is a 69 y.o. male, seen on rounds today.  Pt initially presented to the ED for complaints of Alcohol Intoxication Currently, the patient is resting.  Physical Exam  BP (!) 161/91   Pulse 70   Temp 97.7 F (36.5 C) (Oral)   Resp 18   Ht 5' 4"  (1.626 m)   Wt 54 kg   SpO2 97%   BMI 20.43 kg/m  Physical Exam Constitutional:      Appearance: He is not ill-appearing or toxic-appearing.  HENT:     Head: Atraumatic.  Eyes:     Extraocular Movements: Extraocular movements intact.     Pupils: Pupils are equal, round, and reactive to light.  Pulmonary:     Effort: Pulmonary effort is normal.  Abdominal:     General: There is no distension.  Musculoskeletal:        General: No deformity.  Skin:    General: Skin is warm and dry.  Neurological:     General: No focal deficit present.     Cranial Nerves: No cranial nerve deficit.      ED Course / MDM  EKG:    I have reviewed the labs performed to date as well as medications administered while in observation.  Recent changes in the last 24 hours include none. Continues to be compliant. Ambulated with PT today.  Plan  Current plan is for continued bed search. [name] not under full IVC at this time.   Vladimir Crofts, MD 10/10/20 [phone]

## 2020-10-10 NOTE — ED Notes (Signed)
Pt napping in recliner

## 2020-10-10 NOTE — ED Notes (Signed)
Pt in recliner watching tv. No needs expressed.

## 2020-10-11 NOTE — ED Notes (Signed)
PT up to restroom

## 2020-10-11 NOTE — ED Notes (Signed)
Pt resting in bed at [facility]

## 2020-10-11 NOTE — Progress Notes (Signed)
Physical Therapy Treatment Patient Details Name: Douglas Peterson. MRN: [id number] DOB: 1951-09-10 Today's Date: 10/11/2020    History of Present Illness Achilles Neville. is a 70YOV who comes to [facility] on 10/16 after fall at home. Pt found by EMS in his car with blood on back of head, pt reportedly fell in garage earlier in day, hitting head and buttocks. 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 Pt recently DC back to home on 10/12 after SIRS admission.    PT Comments    Pt was pleasant and motivated to participate during the session.  Pt was able to perform 5TSTS in 14 sec without UE assist.  Pt required cuing for increased trunk flexion during stand to sit for improved eccentric control.  Pt presented with good stability during ambulation with a SPC and fair stability during side stepping and backwards walking without UE support.  Pt will benefit from HHPT services upon discharge to safely address deficits listed in patient problem list for decreased caregiver assistance and eventual return to PLOF.     Follow Up Recommendations  Home health PT;Supervision/Assistance - 24 hour     Equipment Recommendations  None recommended by PT    Recommendations for Other Services       Precautions / Restrictions Precautions Precautions: Fall Precaution Comments: old right clavicular fracture Restrictions Weight Bearing Restrictions: Yes RUE Weight Bearing: Weight bearing as tolerated Other Position/Activity Restrictions: Rt clavicle fx on 06/05/20    Mobility  Bed Mobility               General bed mobility comments: NT, pt in recliner  Transfers Overall transfer level: Needs assistance Equipment used: Straight cane Transfers: Sit to/from Stand Sit to Stand: Supervision         General transfer comment: Fair eccentric and concentric control and stability with occasional issues with excess posterior  weight shift during stand to sit, improved with training and cues for sequncing  Ambulation/Gait Ambulation/Gait assistance: Supervision Gait Distance (Feet): 600 Feet Assistive device: Straight cane Gait Pattern/deviations: Step-through pattern;Decreased step length - right;Decreased step length - left;Trunk flexed Gait velocity: decreased   General Gait Details: Occasional R foot drag during swing through but pt able to self correct without LOB; good sequencing with the [facility]   Stairs             Wheelchair Mobility    Modified Rankin (Stroke Patients Only)       Balance Overall balance assessment: Needs assistance;History of Falls Sitting-balance support: Single extremity supported;Feet supported Sitting balance-Leahy Scale: Good     Standing balance support: Single extremity supported;During functional activity Standing balance-Leahy Scale: Good               High level balance activites: Side stepping High Level Balance Comments: Pt able to side step 30 feet left and right without UE support with fair stability            Cognition Arousal/Alertness: Awake/alert Behavior During Therapy: WFL for tasks assessed/performed Overall Cognitive Status: Within Functional Limits for tasks assessed                                        Exercises Other Exercises Other Exercises: Static standing balance training with multiple foot positions with combinations of eyes open/closed and reaching outside BOS Other  Exercises: Dynamic standing balance training with side stepping and backwards walking without UE support with fair stability    General Comments        Pertinent Vitals/Pain Pain Assessment: No/denies pain    Home Living                      Prior Function            PT Goals (current goals can now be found in the care plan section) Progress towards PT goals: Progressing toward goals    Frequency    Min  2X/week      PT Plan Current plan remains appropriate    Co-evaluation              AM-PAC PT "6 Clicks" Mobility   Outcome Measure  Help needed turning from your back to your side while in a flat bed without using bedrails?: None Help needed moving from lying on your back to sitting on the side of a flat bed without using bedrails?: None Help needed moving to and from a bed to a chair (including a wheelchair)?: A Little Help needed standing up from a chair using your arms (e.g., wheelchair or bedside chair)?: A Little Help needed to walk in hospital room?: A Little Help needed climbing 3-5 steps with a railing? : A Little 6 Click Score: 20    End of Session Equipment Utilized During Treatment: Gait belt Activity Tolerance: Patient tolerated treatment well Patient left: in chair;Other (comment) (Pt left as found in ED room) Nurse Communication: Mobility status PT Visit Diagnosis: Unsteadiness on feet (R26.81);Repeated falls (R29.6);Muscle weakness (generalized) (M62.81);History of falling (Z91.81);Difficulty in walking, not elsewhere classified (R26.2)     Time: 0623-7628 PT Time Calculation (min) (ACUTE ONLY): 23 min  Charges:  $Gait Training: 8-22 mins $Therapeutic Exercise: 8-22 mins                     D. Scott  PT, DPT 10/11/20, 3:51 PM

## 2020-10-12 NOTE — ED Notes (Signed)
Pt alert. Calmly sitting in bed. Urinated 200cc.

## 2020-10-12 NOTE — ED Notes (Signed)
Pt asleep at this time

## 2020-10-12 NOTE — ED Notes (Signed)
Pt walked to nurses station and back to bed.

## 2020-10-12 NOTE — Progress Notes (Signed)
Occupational Therapy Treatment Patient Details Name: Douglas Peterson. MRN: [id number] DOB: [birth year] Today's Date: 10/12/2020    History of present illness Saagar Tortorella. is a 2VOZ who comes to [facility] on 10/16 after fall at home. Pt found by EMS in his car with blood on back of head, pt reportedly fell in garage earlier in day, hitting head and buttocks. 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 Pt recently DC back to home on 10/12 after SIRS admission.   OT comments  Mr Hellmer was seen for OT treatment on this date. Upon arrival pt standing in hallway (recently moved to hallway ED bed) and agreeable to session. SBA don B shoes, MAX A tie shoes. SBA + SPC for ~500 ft mobility. Pt demonstrated recall of 2/3 RUE HEP exercises, MIN A VCs for technique. Pt making good progress toward goals. Pt continues to benefit from skilled OT services to maximize return to PLOF and minimize risk of future falls, injury, caregiver burden, and readmission. Will continue to follow POC. Discharge recommendation remains appropriate.    Follow Up Recommendations  Home health OT;Supervision/Assistance - 24 hour    Equipment Recommendations  None recommended by OT    Recommendations for Other Services      Precautions / Restrictions Precautions Precautions: Fall Precaution Comments: old right clavicular fracture       Mobility Bed Mobility    General bed mobility comments: Pt received standing in hallway  Transfers Overall transfer level: Needs assistance Equipment used: Straight cane Transfers: Sit to/from Stand Sit to Stand: Supervision              Balance Overall balance assessment: Needs assistance;History of Falls         Standing balance support: Single extremity supported;During functional activity Standing balance-Leahy Scale: Good        ADL either performed or assessed with clinical judgement    ADL Overall ADL's : Needs assistance/impaired      General ADL Comments: SBA don B shoes, MAX A tie shoes. SBA + SPC for ~500 ft mobility. VCs for technique to demonstrate 2/3 RUE HEP exercises               Cognition Arousal/Alertness: Awake/alert Behavior During Therapy: WFL for tasks assessed/performed Overall Cognitive Status: Within Functional Limits for tasks assessed         Exercises Exercises: Other exercises Other Exercises Other Exercises: Pt educated re: OT role, DME recs, d/c recs, falls prevention, HEP Other Exercises: Dynamic standing, sit<>stand, sitting/standing balance/tolerance, LBD           Pertinent Vitals/ Pain       Pain Assessment: No/denies pain         Frequency  Min 1X/week        Progress Toward Goals  OT Goals(current goals can now be found in the care plan section)  Progress towards OT goals: Progressing toward goals  Acute Rehab OT Goals Patient Stated Goal: To go home OT Goal Formulation: With patient Time For Goal Achievement: 10/18/20 Potential to Achieve Goals: Fair ADL Goals Pt Will Perform Grooming: with modified independence;standing Pt Will Perform Lower Body Dressing: with modified independence;sit to/from stand Pt Will Transfer to Toilet: with modified independence;ambulating;regular height toilet Pt Will Perform Toileting - Clothing Manipulation and hygiene: with modified independence;sitting/lateral leans Additional ADL Goal #1: Pt will independently verbalize a plan to implement at least 3 learned falls prevention strategies  into his daily routines/home environment for improved safety and functional independence upon hospital DC.  Plan Discharge plan remains appropriate;Frequency remains appropriate       AM-PAC OT "6 Clicks" Daily Activity     Outcome Measure   Help from another person eating meals?: None Help from another person taking care of personal grooming?: A Little Help from another person  toileting, which includes using toliet, bedpan, or urinal?: A Little Help from another person bathing (including washing, rinsing, drying)?: A Little Help from another person to put on and taking off regular upper body clothing?: None Help from another person to put on and taking off regular lower body clothing?: A Little 6 Click Score: 20    End of Session Equipment Utilized During Treatment:  [facility])  OT Visit Diagnosis: Other abnormalities of gait and mobility (R26.89);Muscle weakness (generalized) (M62.81);Repeated falls (R29.6)   Activity Tolerance Patient tolerated treatment well   Patient Left in bed;with nursing/sitter in room   Nurse Communication Mobility status        Time: 3568-6168 OT Time Calculation (min): 15 min  Charges: OT General Charges $OT Visit: 1 Visit OT Treatments $Self Care/Home Management : 8-22 mins  Dessie Coma, M.S. OTR/L  10/12/20, 4:36 PM  ascom [phone]

## 2020-10-12 NOTE — ED Notes (Signed)
Pt sitting calmly in bed. Alert.

## 2020-10-12 NOTE — ED Notes (Signed)
Pt resting at this time.

## 2020-10-12 NOTE — ED Notes (Signed)
Pt asleep. Chest rise and fall visible.

## 2020-10-12 NOTE — ED Notes (Signed)
Pt resting calmly in bed. Using his personal phone. Reports coffee this RN gave him recently per request was good. Denies any needs.

## 2020-10-12 NOTE — TOC Progression Note (Signed)
Transition of Care (TOC) - Progression Note    Patient Details  Name: Douglas Peterson. MRN: [id number] Date of Birth: [birth year]  Transition of Care [facility]) CM/SW [place], [place] Phone Number: [phone] 10/12/2020, 4:31 PM  Clinical Narrative:     CSW went to see patient to update him on placement status.  Patient does have placement at [place] and can d/c to the group home on Wed. 10/17/2020.  Patient does not want to go to group home, he would prefer an ALF. CSW stated I was still looking for ALF placement but I have been told from the ALF that I had contacted they were declining due to recent alcohol use.  Patient verbalized understanding but still stated he did not want to go to a group home.  CSW spoke with patient's daughter [name] [phone], with placement update.  Ms. Nadean Corwin stated the patient really wanted to go to an ALF.  CSW relayed the reasons I had been given for the declines from group home.  Ms. Nadean Corwin stated she wanted to try to find a group home near her home in [facility] [place] [facility].  Ms. Nadean Corwin also stated she spoke with Kamiah [phone] about placement at [place], but Norvel Richards had told her she had not received the paperwork she needed.  CSW stated I would contact Ms. Cottman.  Ms. Nadean Corwin stated she would look for ALF placement in her area and requested CSW send FL2 to her.  CSW spoke with Ms. [facility] APS, who stated [facility] had not sent her the paper work she needed to fill out for placement. Ms. Marisue Brooklyn stated she would go to [facility] if they did not send her the paperwork on Monday 10/15/2020.  Ms. Marisue Brooklyn requested CSW email FL2 to her.  CSW verbalized understanding.  Expected Discharge Plan: Skilled Nursing Facility Barriers to Discharge: ED Unsafe disposition,Other (comment),Unsafe home situation (pending guardianship paperwork and executor having access to funds, ALF pending  bed offer)  Expected Discharge Plan and Services Expected Discharge Plan: [place]   Discharge Planning Services: CM Consult   Living arrangements for the past 2 months: Single Family Home                                       Social Determinants of Health (SDOH) Interventions    Readmission Risk Interventions Readmission Risk Prevention Plan 08/23/2020 08/19/2020 08/14/2020  Transportation Screening Complete Complete Complete  PCP or Specialist Appt within 3-5 Days - - -  HRI or [place] - - -  Palliative Care Screening - - -  Medication Review ([place]) Complete Complete -  PCP or Specialist appointment within 3-5 days of discharge (No Data) Complete Complete  HRI or Home Care Consult Complete - Complete  SW Recovery Care/Counseling Consult - Complete Complete  Palliative Care Screening - Not Applicable Not [place] Not Applicable - Patient Refused  Some recent data might be hidden

## 2020-10-12 NOTE — ED Notes (Signed)
Pt given chips as requested. Denies any other needs.

## 2020-10-12 NOTE — ED Notes (Signed)
Pt on personal phone. Bed locked low. Rail up. Denies any needs currently.

## 2020-10-12 NOTE — ED Provider Notes (Signed)
-----------------------------------------   5:41 AM on 10/12/2020 -----------------------------------------   Blood pressure 140/74, pulse 70, temperature 98 F (36.7 C), temperature source Oral, resp. rate 18, height 5' 4"  (1.626 m), weight 54 kg, SpO2 98 %.  The patient is resting at this time.  There have been no acute events since the last update.  Awaiting disposition plan from Social Work team.   Paulette Blanch, MD 10/12/20 [phone]

## 2020-10-12 NOTE — ED Notes (Signed)
Pt asleep in bed. Bed locked low. Rail up. Call bell within reach. Pt has yellow fall risk, non-slip socks on bilaterally. Fall mats placed across room floor.

## 2020-10-12 NOTE — ED Notes (Signed)
Pt eating lunch

## 2020-10-12 NOTE — ED Notes (Signed)
Pt resting in bed. Bed locked low. Rail up. Call bell within reach.

## 2020-10-13 MED ORDER — MELATONIN 3 MG PO TABS
3.0000 mg | ORAL_TABLET | Freq: Once | ORAL | Status: DC
  Filled 2020-10-13: qty 1

## 2020-10-13 MED ORDER — MELATONIN 5 MG PO TABS
2.5000 mg | ORAL_TABLET | Freq: Once | ORAL | Status: AC
  Administered 2020-10-13: 04:00:00 2.5 mg via ORAL
  Filled 2020-10-13: qty 1
  Filled 2020-10-13: qty 0.5

## 2020-10-13 NOTE — ED Notes (Signed)
Pt ambulated to bathroom with cane independently and returned to his bed.

## 2020-10-13 NOTE — ED Notes (Signed)
Meal tray given 

## 2020-10-13 NOTE — ED Notes (Signed)
Pt sitting in bed eating dinner tray. NAD noted at this time.

## 2020-10-13 NOTE — ED Notes (Signed)
PT AMBULATORY AROUND ROOM WITH CANE WITHOUT DIFFICULTY.

## 2020-10-13 NOTE — ED Notes (Signed)
[name] vol pending placement

## 2020-10-13 NOTE — ED Notes (Signed)
Pt sleeping. VS will be taken once awake.

## 2020-10-13 NOTE — ED Notes (Signed)
Lights dimmed for comfort.

## 2020-10-14 NOTE — ED Notes (Signed)
Pt given lunch tray.

## 2020-10-14 NOTE — ED Provider Notes (Signed)
-----------------------------------------   10:36 PM on 10/14/2020 -----------------------------------------   BP (!) 121/46 (BP Location: Right Arm)   Pulse (!) 57   Temp 97.7 F (36.5 C) (Oral)   Resp 16   Ht 1.626 m (5' 4" )   Wt 54 kg   SpO2 96%   BMI 20.43 kg/m   No acute events today or overnight. Vitals reviewed.  [name] ambulating around his room as usual and is overall well-appearing.  Patient remains medically cleared.  Disposition is pending per social work    Lavonia Drafts, MD 10/14/20 2236

## 2020-10-14 NOTE — ED Notes (Signed)
Pt given breakfast tray

## 2020-10-14 NOTE — ED Notes (Signed)
Pt given dinner tray.

## 2020-10-14 NOTE — ED Notes (Signed)
Pt sleeping, resps unlabored.

## 2020-10-14 NOTE — ED Provider Notes (Signed)
-----------------------------------------   5:59 AM on 10/14/2020 -----------------------------------------   Blood pressure (!) 120/58, pulse (!) 57, temperature 97.7 F (36.5 C), temperature source Oral, resp. rate 15, height 5' 4"  (1.626 m), weight 54 kg, SpO2 98 %.  The patient is resting at this time.  There have been no acute events since the last update.  Awaiting disposition plan from Social Work team.   Paulette Blanch, MD 10/14/20 0600

## 2020-10-14 NOTE — ED Notes (Signed)
Pt ambulated with cane to bathroom to shave. Independent, a little stiff in the gait, but does not require assistance. Monitored for safety and staff within arms reach entire time

## 2020-10-15 NOTE — ED Provider Notes (Signed)
-----------------------------------------   3:22 PM on 10/15/2020 -----------------------------------------   Blood pressure (!) 164/97, pulse (!) 58, temperature 97.8 F (36.6 C), temperature source Oral, resp. rate 18, height 5' 4"  (1.626 m), weight 54 kg, SpO2 98 %.  The patient is calm and cooperative at this time.  There have been no acute events since the last update.  Awaiting disposition plan from Social Work team(s).    [name], MD 10/15/20 [phone]

## 2020-10-15 NOTE — ED Notes (Signed)
Pt to bed att, no further needs att, lights dimmed and tv off

## 2020-10-15 NOTE — ED Notes (Signed)
Pt noted to have even and unlabored respirations, will continue to monitor

## 2020-10-15 NOTE — Progress Notes (Signed)
Physical Therapy Treatment Patient Details Name: Douglas Peterson. MRN: [id number] DOB: [birth year] Today's Date: 10/15/2020    History of Present Illness Douglas Peterson. is a 34LPF who comes to [facility] on 10/16 after fall at home. Pt found by EMS in his car with blood on back of head, pt reportedly fell in garage earlier in day, hitting head and buttocks. 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 Pt recently DC back to home on 10/12 after SIRS admission.    PT Comments    Patient received in bed, pleasant, agreeable to PT session. He reports he gets up to toilet in room independently. [name] independent with bed mobility, independent for transfers, ambulated 600 feet with SPC and supervision. Patient without LOB or difficulty. Standing exercises performed at end of session. He will continue to benefit from skilled PT while here to maintain functional level and improve safety.       Follow Up Recommendations  Home health PT;Supervision - Intermittent     Equipment Recommendations  None recommended by PT    Recommendations for Other Services       Precautions / Restrictions Precautions Precautions: Fall Restrictions Weight Bearing Restrictions: No    Mobility  Bed Mobility Overal bed mobility: Independent Bed Mobility: Supine to Sit     Supine to sit: Independent        Transfers Overall transfer level: Independent Equipment used: Straight cane Transfers: Sit to/from Stand Sit to Stand: Independent         General transfer comment: patient stands without cane, turns around to retrieve cane for ambulation.  Ambulation/Gait Ambulation/Gait assistance: Supervision Gait Distance (Feet): 600 Feet Assistive device: Straight cane Gait Pattern/deviations: Step-through pattern;Decreased step length - right;Decreased step length - left;Trunk flexed Gait velocity: decreased   General Gait  Details: Occasional R foot drag during swing through but pt able to self correct without LOB; good sequencing with the [facility]   Stairs             Wheelchair Mobility    Modified Rankin (Stroke Patients Only)       Balance Overall balance assessment: Needs assistance;History of Falls Sitting-balance support: Feet supported Sitting balance-Leahy Scale: Good     Standing balance support: Single extremity supported;During functional activity Standing balance-Leahy Scale: Good Standing balance comment: Min lean on the [facility] for support during ambulation and fair to good stability during standing balance training without UE support                            Cognition Arousal/Alertness: Awake/alert Behavior During Therapy: WFL for tasks assessed/performed Overall Cognitive Status: Within Functional Limits for tasks assessed                                        Exercises Other Exercises Other Exercises: standing LE strengthening exercises: heel raises, marching, hip extension x 10 reps each    General Comments        Pertinent Vitals/Pain Pain Assessment: No/denies pain    Home Living                      Prior Function            PT Goals (current goals can now be found in the  care plan section) Acute Rehab PT Goals Patient Stated Goal: To go home PT Goal Formulation: With patient Time For Goal Achievement: 10/29/20 Potential to Achieve Goals: Fair Progress towards PT goals: Progressing toward goals    Frequency    Min 2X/week      PT Plan Current plan remains appropriate    Co-evaluation              AM-PAC PT "6 Clicks" Mobility   Outcome Measure  Help needed turning from your back to your side while in a flat bed without using bedrails?: None Help needed moving from lying on your back to sitting on the side of a flat bed without using bedrails?: None Help needed moving to and from a bed to a chair  (including a wheelchair)?: None Help needed standing up from a chair using your arms (e.g., wheelchair or bedside chair)?: None Help needed to walk in hospital room?: A Little Help needed climbing 3-5 steps with a railing? : A Little 6 Click Score: 22    End of Session Equipment Utilized During Treatment: Gait belt Activity Tolerance: Patient tolerated treatment well Patient left: in chair;with call bell/phone within reach Nurse Communication: Mobility status PT Visit Diagnosis: Repeated falls (R29.6);Difficulty in walking, not elsewhere classified (R26.2);Unsteadiness on feet (R26.81)     Time: 1010-1041 PT Time Calculation (min) (ACUTE ONLY): 31 min  Charges:  $Gait Training: 8-22 mins $Therapeutic Exercise: 8-22 mins                      , PT, GCS 10/15/20,11:23 AM

## 2020-10-15 NOTE — ED Notes (Signed)
VOLUNTARY/pending SW placement

## 2020-10-15 NOTE — ED Notes (Signed)
Pt laying in bed, TV and lights on, pt phone has video playing, pt NAD distress, given fluids, pt declined further intervention, pt calm and pleasant

## 2020-10-15 NOTE — ED Notes (Signed)
Pt up to toilet and returned to bed light dimmed, TV off

## 2020-10-15 NOTE — TOC Progression Note (Signed)
Transition of Care (TOC) - Progression Note    Patient Details  Name: Sierra Bissonette. MRN: [id number] Date of Birth: 1951/05/07  Transition of Care [facility]) CM/SW [place], [place] Phone Number: [phone] 10/15/2020, 3:05 PM  Clinical Narrative:     CSW faxed [phone] FL2 to [facility] Attention Rogelia Mire for possible placement.  Expected Discharge Plan: Skilled Nursing Facility Barriers to Discharge: ED Unsafe disposition,Other (comment),Unsafe home situation (pending guardianship paperwork and executor having access to funds, ALF pending bed offer)  Expected Discharge Plan and Services Expected Discharge Plan: [place]   Discharge Planning Services: CM Consult   Living arrangements for the past 2 months: Single Family Home                                       Social Determinants of Health (SDOH) Interventions    Readmission Risk Interventions Readmission Risk Prevention Plan 08/23/2020 08/19/2020 08/14/2020  Transportation Screening Complete Complete Complete  PCP or Specialist Appt within 3-5 Days - - -  HRI or [place] - - -  Palliative Care Screening - - -  Medication Review ([place]) Complete Complete -  PCP or Specialist appointment within 3-5 days of discharge (No Data) Complete Complete  HRI or Home Care Consult Complete - Complete  SW Recovery Care/Counseling Consult - Complete Complete  Palliative Care Screening - Not Applicable Not [place] Not Applicable - Patient Refused  Some recent data might be hidden

## 2020-10-15 NOTE — NC FL2 (Signed)
[place] LEVEL OF CARE SCREENING TOOL     IDENTIFICATION  Patient Name: Douglas Peterson. Birthdate: 1951-04-23 Sex: male Admission Date (Current Location): 09/19/2020  [place] and [place] Number:  Engineering geologist and Address:  [facility], [address], [place], [place] [address]      Provider Number: [id number]  Attending Physician Name and Address:  No att. providers found  Relative Name and Phone Number:  Aida Raider ([place])   [phone]    Current Level of Care: Hospital Recommended Level of Care: [place] Prior Approval Number:    Date Approved/Denied:   PASRR Number: [id number]  Discharge Plan: Other (Comment) ([place])    Current Diagnoses: Patient Active Problem List   Diagnosis Date Noted  . SIRS (systemic inflammatory response syndrome) ([place]) 08/05/2020  . GERD (gastroesophageal reflux disease) 06/05/2020  . Alcohol use disorder, severe, dependence ([employer]) 05/28/2020  . Paroxysmal A-fib ([place]) 03/17/2020  . Severe protein-calorie malnutrition ([place]) 02/25/2020  . Left inguinal hernia 01/31/2020  . Encounter for competency evaluation   . Pancytopenia ([place])   . Alcoholic cirrhosis of liver without ascites ([place])   . Thrombocytopenia ([place])   . 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 Alcohol withdrawal delirium, acute, hyperactive ([place]) 09/21/2019  . Pressure injury of ankle, stage 1 08/15/2019  . Palliative care by specialist   . Alcohol withdrawal ([place]) 03/20/2019  . Delirium tremens ([place]) 03/08/2018  . HTN (hypertension) 09/16/2017    Orientation RESPIRATION BLADDER Height & Weight     Self,Time,Situation,Place  Normal Continent Weight: 119 lb 0.8 oz (54 kg) Height:  5' 4"  (162.6 cm)  BEHAVIORAL SYMPTOMS/MOOD NEUROLOGICAL BOWEL NUTRITION STATUS      Continent Diet  AMBULATORY STATUS COMMUNICATION OF NEEDS Skin    Supervision Verbally Normal                       Personal Care Assistance Level of Assistance  Bathing,Feeding,Dressing Bathing Assistance: Independent Feeding assistance: Independent Dressing Assistance: Independent Total Care Assistance: Independent   Functional Limitations Info  Sight,Hearing,Speech Sight Info: Adequate Hearing Info: Adequate Speech Info: Adequate    SPECIAL CARE FACTORS FREQUENCY  PT (By licensed PT),OT (By licensed OT)     PT Frequency: 2X OT Frequency: 5x a week            Contractures Contractures Info: Present (Right hand contracture)    Additional Factors Info  Code Status,Allergies Code Status Info: FULL Allergies Info: hydrochlorothiazide           Current Medications (10/15/2020):  This is the current hospital active medication list Current Facility-Administered Medications  Medication Dose Route Frequency Provider Last Rate Last Admin  . acetaminophen (TYLENOL) tablet 1,000 mg  1,000 mg Oral Q6H PRN Hinda Kehr, MD   1,000 mg at 10/12/20 1556  . atenolol (TENORMIN) tablet 25 mg  25 mg Oral BID Lucrezia Starch, MD   25 mg at 10/15/20 1050  . baclofen (LIORESAL) tablet 10 mg  10 mg Oral TID Lucrezia Starch, MD   10 mg at 10/15/20 1050  . PARoxetine (PAXIL) tablet 30 mg  30 mg Oral Daily Lucrezia Starch, MD   30 mg at 10/15/20 1050  . polyethylene glycol (MIRALAX / GLYCOLAX) packet 17 g  17 g Oral Daily Blake Divine, MD   17 g at 10/15/20 1050  . thiamine tablet 100 mg  100 mg Oral Daily  Lucrezia Starch, MD   100 mg at 10/15/20 1051   Or  . thiamine (B-1) injection 100 mg  100 mg Intravenous Daily Lucrezia Starch, MD       Current Outpatient Medications  Medication Sig Dispense Refill  . Ascorbic Acid (VITAMIN C) 1000 MG tablet Take 1,000 mg by mouth daily.    [name] aspirin EC 81 MG tablet Take 81 mg by mouth daily. Swallow whole.    [name] atenolol (TENORMIN) 25 MG tablet Take 25 mg by mouth 2 (two) times daily.     .  baclofen (LIORESAL) 10 MG tablet Take 10 mg by mouth 3 (three) times daily.    . calcium-vitamin D (OSCAL WITH D) 500-200 MG-UNIT tablet Take 1 tablet by mouth daily with breakfast.    . Cholecalciferol 125 MCG (5000 UT) TABS Take 5,000 Units by mouth daily.    [name] doxepin (SINEQUAN) 10 MG capsule Take 10 mg by mouth at bedtime as needed (sleep).     . feeding supplement, ENSURE ENLIVE, (ENSURE ENLIVE) LIQD Take 237 mLs by mouth 2 (two) times daily between meals. 244 mL 12  . folic acid (FOLVITE) 1 MG tablet Take 1 tablet (1 mg total) by mouth daily. 30 tablet 0  . hydrOXYzine (ATARAX/VISTARIL) 25 MG tablet Take 25 mg by mouth 3 (three) times daily as needed for anxiety or itching.     . Multiple Vitamin (MULTIVITAMIN WITH MINERALS) TABS tablet Take 1 tablet by mouth daily. 30 tablet 0  . PARoxetine (PAXIL) 30 MG tablet Take 30 mg by mouth daily.    [name] senna-docusate (SENOKOT-S) 8.6-50 MG tablet Take 2 tablets by mouth at bedtime. 30 tablet 1  . thiamine 100 MG tablet Take 1 tablet (100 mg total) by mouth daily. 30 tablet 0  . acetaminophen (TYLENOL) 500 MG tablet Take 1 tablet (500 mg total) by mouth every 8 (eight) hours as needed for mild pain or fever. 15 tablet 0     Discharge Medications: Please see discharge summary for a list of discharge medications.  Relevant Imaging Results:  Relevant Lab Results:   Additional Information SS# [id number]                              **[name] private pay**  [place], [place]

## 2020-10-15 NOTE — ED Notes (Signed)
Pt given coffee this morning. Pt up in recliner chair awaiting breakfast. No further needs expressed by pt.

## 2020-10-15 NOTE — Progress Notes (Signed)
Occupational Therapy Treatment Patient Details Name: Douglas Peterson. MRN: [id number] DOB: [birth year] Today's Date: 10/15/2020    History of present illness Douglas Peterson. is a 81EXN who comes to [facility] on 10/16 after fall at home. Pt found by EMS in his car with blood on back of head, pt reportedly fell in garage earlier in day, hitting head and buttocks. 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 Pt recently DC back to home on 10/12 after SIRS admission.   OT comments  Douglas Peterson was seen for OT treatment on this date. Upon arrival to room pt awake/alert, seated upright in room recliner, agreeable to OT tx session. Pt verbalizes concerns about his inability to sleep well at night as well as his decreased ROM in his RUE. Pt and provider discuss strategies for maintaining sleep hygiene while in the hospital including increasing exposure to natural light when able, decreasing artificial light during night time hours including limiting blue light from his phone, and maximizing physical activity during the day. Pt return verbalizes understanding of education provided. OT facilitates PROM of pt RUE which remains in spastic tone with elbow flexed to ~90, wrist in min extension, and MCPs/IPs flexed. Pt educated on self-PROM strategies including use of warm washcloth and gentle passive stretch to support relaxation and decrease muscle tone. With PROM and use of warm wash cloth by therapist, pt able to achieve PROM of his R elbow to ~110, wrist flexion to ~140, MCPs to neutral. Pt does not maintain without passive facilitation from therapist. Pt return demos understanding of education provided. Pt return verbalizes understanding of education provided t/o session. He is eager to engage in functional mobility and "find a window to get some sun". Pt ambulates ~200 feet in hallway with min cueing for safety and use of SPC. Pt making good  progress toward goals, POC updated to reflect current needs/progress. Pt continues to benefit from skilled OT services to maximize return to PLOF and minimize risk of future falls, injury, caregiver burden, and readmission. Discharge recommendation remains appropriate.    Follow Up Recommendations  Home health OT;Supervision/Assistance - 24 hour    Equipment Recommendations  None recommended by OT    Recommendations for Other Services      Precautions / Restrictions Precautions Precautions: Fall Precaution Comments: old right clavicular fracture Required Braces or Orthoses:  (no sling needed as of 10/21 per Dr. Mack Guise) Restrictions Weight Bearing Restrictions: No       Mobility Bed Mobility Overal bed mobility: Independent Bed Mobility: Supine to Sit     Supine to sit: Independent     General bed mobility comments: Deferred. Pt in recliner at start/end of session.  Transfers Overall transfer level: Modified independent Equipment used: Straight cane Transfers: Sit to/from Stand Sit to Stand: Modified independent (Device/Increase time)         General transfer comment: Pt demonstrates safe use of SPC during STS/mobility    Balance Overall balance assessment: Needs assistance;History of Falls Sitting-balance support: Feet supported;No upper extremity supported Sitting balance-Leahy Scale: Good Sitting balance - Comments: Steady static sitting, reaching within BOS.   Standing balance support: Single extremity supported;During functional activity Standing balance-Leahy Scale: Good Standing balance comment: Minimially reliant on 1 UE support on SPC t/o standing tasks.                           ADL  either performed or assessed with clinical judgement   ADL Overall ADL's : Needs assistance/impaired Eating/Feeding: Set up;Modified independent;Sitting Eating/Feeding Details (indicate cue type and reason): Pt requires min set-up for 2-handed self-feeding  tasks including opening small packets, cutting meats, etc. He is generally able to eat from hospital meal trays with MOD I while seated upright in recliner.                   Lower Body Dressing Details (indicate cue type and reason): Increased assist required to tie shoes. Anticipate MOD I for LB dressing tasks with decreased fine motor req. Toilet Transfer: Modified Engineer, manufacturing systems Details (indicate cue type and reason): SPC for mobility           General ADL Comments: SBA + SPC for ~500 ft mobility. VCs for technique to demonstrate 3/3 RUE HEP exercises     Vision Patient Visual Report: No change from baseline     Perception     Praxis      Cognition Arousal/Alertness: Awake/alert Behavior During Therapy: WFL for tasks assessed/performed Overall Cognitive Status: Within Functional Limits for tasks assessed                                 General Comments: Pt remains A&Ox4 t/o session however demonstrates decreased safety awareness/awareness of deficits with continued insistance he could safely live at home alone despite significant hospital re-admissions in past year.        Exercises Other Exercises Other Exercises: OT facilitates pt education on DC recommendations, falls prevention, safe use of AE for functional mobility, sleep hygiene, and safe transfer techniques. Other Exercises: PROM of pt RUE x10 min with use of warm wash cloth to promote improved ROM of R wrist, hand, and digits this date. Pt able to return demonstrate understanding of PROM techniques and HEP. Other Exercises: standing LE strengthening exercises: heel raises, marching, hip extension x 10 reps each   Shoulder Instructions       General Comments      Pertinent Vitals/ Pain       Pain Assessment: No/denies pain  Home Living                                          Prior Functioning/Environment              Frequency  Min  1X/week        Progress Toward Goals  OT Goals(current goals can now be found in the care plan section)  Progress towards OT goals: Progressing toward goals;Goals met and updated - see care plan  Acute Rehab OT Goals Patient Stated Goal: To go home OT Goal Formulation: With patient Time For Goal Achievement: 10/29/20 Potential to Achieve Goals: Fair ADL Goals Pt Will Perform Toileting - Clothing Manipulation and hygiene: with modified independence;sit to/from stand  Plan Discharge plan remains appropriate;Frequency remains appropriate    Co-evaluation                 AM-PAC OT "6 Clicks" Daily Activity     Outcome Measure   Help from another person eating meals?: None Help from another person taking care of personal grooming?: A Little Help from another person toileting, which includes using toliet, bedpan, or urinal?: A Little Help from another person bathing (including washing,  rinsing, drying)?: A Little Help from another person to put on and taking off regular upper body clothing?: None Help from another person to put on and taking off regular lower body clothing?: A Little 6 Click Score: 20    End of Session Equipment Utilized During Treatment:  [facility])  OT Visit Diagnosis: Other abnormalities of gait and mobility (R26.89);Muscle weakness (generalized) (M62.81);Repeated falls (R29.6)   Activity Tolerance Patient tolerated treatment well   Patient Left in chair   Nurse Communication          Time: 4270-6237 OT Time Calculation (min): 24 min  Charges: OT General Charges $OT Visit: 1 Visit OT Treatments $Therapeutic Activity: 8-22 mins $Therapeutic Exercise: 8-22 mins   Shara Blazing, M.S., OTR/L Ascom: [phone] 10/15/20, 2:19 PM

## 2020-10-16 NOTE — ED Notes (Signed)
Pt sitting in a recliner.  Pt alert  Pt calm and cooperative.

## 2020-10-16 NOTE — ED Notes (Signed)
Pt sitting in recliner.  No distress noted.

## 2020-10-16 NOTE — ED Notes (Signed)
Pt sitting in recliner watching tv   Pt alert.  Pt ate dinner tray.

## 2020-10-16 NOTE — ED Notes (Signed)
VOL  PENDING  PLACEMENT

## 2020-10-16 NOTE — TOC Progression Note (Signed)
Transition of Care (TOC) - Progression Note    Patient Details  Name: Zyere Jiminez. MRN: [id number] Date of Birth: 1951-09-08  Transition of Care [facility]) CM/SW [place], [place] Phone Number: [phone] 10/16/2020, 10:01 AM  Clinical Narrative:     CSW spoke with patient's guardian [facility] for status update on patient disposition.  CSW informed Ms. Cottman the patient does have bed offer at [facility] but I have been unable to find him placement in ALF due to hx of alcoholism.  Ms. Marisue [place] stated she understood the patient may have to go to the group home before ALF placement.  Expected Discharge Plan: Skilled Nursing Facility Barriers to Discharge: ED Unsafe disposition,Other (comment),Unsafe home situation (pending guardianship paperwork and executor having access to funds, ALF pending bed offer)  Expected Discharge Plan and Services Expected Discharge Plan: [place]   Discharge Planning Services: CM Consult   Living arrangements for the past 2 months: Single Family Home                                       Social Determinants of Health (SDOH) Interventions    Readmission Risk Interventions Readmission Risk Prevention Plan 08/23/2020 08/19/2020 08/14/2020  Transportation Screening Complete Complete Complete  PCP or Specialist Appt within 3-5 Days - - -  [place] or [place] - - -  Palliative Care Screening - - -  Medication Review ([place]) Complete Complete -  PCP or Specialist appointment within 3-5 days of discharge (No Data) Complete Complete  HRI or Home Care Consult Complete - Complete  SW Recovery Care/Counseling Consult - Complete Complete  Palliative Care Screening - Not Applicable Not [place] Not Applicable - Patient Refused  Some recent data might be hidden

## 2020-10-16 NOTE — TOC Progression Note (Signed)
Transition of Care (TOC) - Progression Note    Patient Details  Name: Douglas Peterson. MRN: [id number] Date of Birth: [birth year]  Transition of Care [facility]) CM/SW [place], [place] Phone Number: [phone] 10/16/2020, 12:46 PM  Clinical Narrative:     CSW called and left message for Rogelia Mire at [facility] for status update on possible ALF placement.   Expected Discharge Plan: Skilled Nursing Facility Barriers to Discharge: ED Unsafe disposition,Other (comment),Unsafe home situation (pending guardianship paperwork and executor having access to funds, ALF pending bed offer)  Expected Discharge Plan and Services Expected Discharge Plan: [place]   Discharge Planning Services: CM Consult   Living arrangements for the past 2 months: Single Family Home                                       Social Determinants of Health (SDOH) Interventions    Readmission Risk Interventions Readmission Risk Prevention Plan 08/23/2020 08/19/2020 08/14/2020  Transportation Screening Complete Complete Complete  PCP or Specialist Appt within 3-5 Days - - -  HRI or [place] - - -  Palliative Care Screening - - -  Medication Review ([place]) Complete Complete -  PCP or Specialist appointment within 3-5 days of discharge (No Data) Complete Complete  HRI or Home Care Consult Complete - Complete  SW Recovery Care/Counseling Consult - Complete Complete  Palliative Care Screening - Not Applicable Not [place] Not Applicable - Patient Refused  Some recent data might be hidden

## 2020-10-17 NOTE — ED Notes (Signed)
Patient eating breakfast.

## 2020-10-17 NOTE — ED Notes (Signed)
Pt asleep in the bed.

## 2020-10-17 NOTE — ED Notes (Signed)
Patient came to the door requesting coffee. Patient had steady gait.

## 2020-10-17 NOTE — ED Notes (Signed)
[name] resting comfortably in bedside chair

## 2020-10-17 NOTE — ED Notes (Signed)
Pt sleeping at this time. Will obtain vital signs when pt wakes up.

## 2020-10-17 NOTE — ED Notes (Addendum)
Patient ambulating with OT

## 2020-10-17 NOTE — ED Notes (Signed)
Report given to Mac RN 

## 2020-10-17 NOTE — ED Provider Notes (Signed)
-----------------------------------------   6:43 AM on 10/17/2020 -----------------------------------------   Blood pressure 140/76, pulse 65, temperature 97.6 F (36.4 C), temperature source Oral, resp. rate 20, height 1.626 m (5' 4" ), weight 54 kg, SpO2 99 %.  The patient is calm and cooperative at this time.  There have been no acute events since the last update.  Awaiting disposition plan from Social Work team(s).   Hinda Kehr, MD 10/17/20 [phone]

## 2020-10-17 NOTE — TOC Progression Note (Signed)
Transition of Care (TOC) - Progression Note    Patient Details  Name: Douglas Peterson. MRN: [id number] Date of Birth: 09-26-1951  Transition of Care [facility]) CM/SW [place], [place] Phone Number: [phone] 10/17/2020, 9:55 PM  Clinical Narrative:     CSW contacted [facility] about patient bed offer.  Roswell Miners stated he had not received a call from patient's guardian about payor source for group home stay.  CSW stated she would contact guardian for update.  Patient's Cottman, Norvel Richards (Legal Guardian) [phone] was off today, CSW left voicemail for [employer] for update on patient payor source.  Expected Discharge Plan: Skilled Nursing Facility Barriers to Discharge: ED Unsafe disposition,Other (comment),Unsafe home situation (pending guardianship paperwork and executor having access to funds, ALF pending bed offer)  Expected Discharge Plan and Services Expected Discharge Plan: [place]   Discharge Planning Services: CM Consult   Living arrangements for the past 2 months: Single Family Home                                       Social Determinants of Health (SDOH) Interventions    Readmission Risk Interventions Readmission Risk Prevention Plan 08/23/2020 08/19/2020 08/14/2020  Transportation Screening Complete Complete Complete  PCP or Specialist Appt within 3-5 Days - - -  HRI or [place] - - -  Palliative Care Screening - - -  Medication Review ([place]) Complete Complete -  PCP or Specialist appointment within 3-5 days of discharge (No Data) Complete Complete  HRI or Home Care Consult Complete - Complete  SW Recovery Care/Counseling Consult - Complete Complete  Palliative Care Screening - Not Applicable Not [place] Not Applicable - Patient Refused  Some recent data might be hidden

## 2020-10-17 NOTE — ED Notes (Signed)
[name] resting comfortably sitting in bedside chair on phone.

## 2020-10-17 NOTE — ED Notes (Signed)
Patient given ED meal tray and decaf coffee per request by another RN.

## 2020-10-17 NOTE — Progress Notes (Signed)
Occupational Therapy Treatment Patient Details Name: Douglas Peterson. MRN: [id number] DOB: 1950-12-24 Today's Date: 10/17/2020    History of present illness Douglas Peterson. is a 89BSJ who comes to [facility] on 10/16 after fall at home. Pt found by EMS in his car with blood on back of head, pt reportedly fell in garage earlier in day, hitting head and buttocks. 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 Pt recently DC back to home on 10/12 after SIRS admission.   OT comments  Pt seen for OT tx this date. Session focused on safety with functional mobility for community distances as well as energy conservation and activity pacing. Pt is able to ambulate <1000 with min cueing to utilize therapeutic rest breaks and safety t/o. Prior to session, pt performs UB/LB dressing tasks with MOD I to SUP for safety. He continues to demonstrate improved balance and activity tolerance. See ADL section below for additional details regarding occupational performance. Pt continues to benefit from skilled OT services to maximize return to PLOF and minimize risk of future falls, injury, caregiver burden, and readmission. Will continue to follow POC. Discharge recommendation remains appropriate.     Follow Up Recommendations  Home health OT;Supervision/Assistance - 24 hour    Equipment Recommendations  None recommended by OT    Recommendations for Other Services      Precautions / Restrictions Precautions Precautions: Fall Precaution Comments: old right clavicular fracture Required Braces or Orthoses:  (no sling needed as of 10/21 per Dr. Mack Guise) Restrictions Weight Bearing Restrictions: No RUE Weight Bearing: Weight bearing as tolerated Other Position/Activity Restrictions: Rt clavicle fx on 06/05/20       Mobility Bed Mobility Overal bed mobility: Modified Independent Bed Mobility: Supine to Sit     Supine to sit: HOB  elevated        Transfers Overall transfer level: Modified independent Equipment used: Straight cane Transfers: Sit to/from Stand Sit to Stand: Modified independent (Device/Increase time)         General transfer comment: Pt demonstrates safe use of SPC during STS/mobility    Balance Overall balance assessment: Needs assistance;History of Falls Sitting-balance support: Feet supported;No upper extremity supported Sitting balance-Leahy Scale: Good Sitting balance - Comments: Steady static sitting, reaching within BOS.   Standing balance support: Single extremity supported;During functional activity Standing balance-Leahy Scale: Good Standing balance comment: Minimially reliant on 1 UE support on SPC t/o standing tasks.                           ADL either performed or assessed with clinical judgement   ADL Overall ADL's : Needs assistance/impaired     Grooming: Wash/dry hands;Standing           Upper Body Dressing : Sitting;Supervision/safety     Lower Body Dressing Details (indicate cue type and reason): Pt able to don bilat shoes with MOD I while seated. He does not untie laces and is able to slip on shoes with fair LB access with his LUE. Toilet Transfer: Modified Engineer, manufacturing systems Details (indicate cue type and reason): Pt performs standing toileting at sink with MOD I using SPC for balance during transitional movement. Toileting- Clothing Manipulation and Hygiene: Modified independent;Sit to/from stand       Functional mobility during ADLs: Supervision/safety;Cane General ADL Comments: SUPERVISION + SPC for >1,000 ft mobility with intermittent therapeutic rest breaks.  Vision Patient Visual Report: No change from baseline     Perception     Praxis      Cognition Arousal/Alertness: Awake/alert Behavior During Therapy: WFL for tasks assessed/performed Overall Cognitive Status: Within Functional Limits for tasks  assessed                                 General Comments: Pt remains A&Ox4 t/o session however demonstrates decreased safety awareness/awareness of deficits with continued insistance he could safely live at home alone despite significant hospital re-admissions in past year.        Exercises     Shoulder Instructions       General Comments      Pertinent Vitals/ Pain       Pain Assessment: No/denies pain Pain Score: 0-No pain  Home Living                                          Prior Functioning/Environment              Frequency  Min 1X/week        Progress Toward Goals  OT Goals(current goals can now be found in the care plan section)  Progress towards OT goals: Progressing toward goals  Acute Rehab OT Goals Patient Stated Goal: To go home OT Goal Formulation: With patient Time For Goal Achievement: 10/29/20 Potential to Achieve Goals: [place] Discharge plan remains appropriate;Frequency remains appropriate    Co-evaluation                 AM-PAC OT "6 Clicks" Daily Activity     Outcome Measure   Help from another person eating meals?: None Help from another person taking care of personal grooming?: A Little Help from another person toileting, which includes using toliet, bedpan, or urinal?: A Little Help from another person bathing (including washing, rinsing, drying)?: A Little Help from another person to put on and taking off regular upper body clothing?: None Help from another person to put on and taking off regular lower body clothing?: A Little 6 Click Score: 20    End of Session Equipment Utilized During Treatment:  [facility])  OT Visit Diagnosis: Other abnormalities of gait and mobility (R26.89);Muscle weakness (generalized) (M62.81);Repeated falls (R29.6)   Activity Tolerance Patient tolerated treatment well   Patient Left in chair   Nurse Communication Mobility status        Time:  7989-2119 OT Time Calculation (min): 38 min  Charges: OT General Charges $OT Visit: 1 Visit OT Treatments $Self Care/Home Management : 8-22 mins $Therapeutic Activity: 23-37 mins  Shara Blazing, M.S., OTR/L Ascom: [phone] 10/17/20, 2:08 PM

## 2020-10-17 NOTE — ED Provider Notes (Signed)
Emergency Medicine Observation Re-evaluation Note  English Craighead. is a 69 y.o. male, seen on rounds today.   Physical Exam  BP 125/87    Pulse 62    Temp 97.6 F (36.4 C) (Oral)    Resp 18    Ht 5' 4"  (1.626 m)    Wt 54 kg    SpO2 98%    BMI 20.43 kg/m  Physical Exam General: Patient resting comfortably in bed Lungs: Patient in no respiratory distress Psych: Patient not agitated   Plan  Patient awaiting placement   Nena Polio, MD 10/17/20 1517

## 2020-10-18 NOTE — TOC Progression Note (Signed)
Transition of Care (TOC) - Progression Note    Patient Details  Name: Douglas Peterson. MRN: [id number] Date of Birth: 1950-11-22  Transition of Care [facility]) CM/SW [place], [place] Phone Number: [phone] 10/18/2020, 12:19 PM  Clinical Narrative:     CSW spoke with Aida Raider ([place]) [phone] about patient status for discharge.  Ms. Marisue [place] stated she still needs to verify payor source for patient.  Ms. Marisue [place] stated she will contact CSW as soon as she confirms payor source.  Expected Discharge Plan: Skilled Nursing Facility Barriers to Discharge: ED Unsafe disposition,Other (comment),Unsafe home situation (pending guardianship paperwork and executor having access to funds, ALF pending bed offer)  Expected Discharge Plan and Services Expected Discharge Plan: [place]   Discharge Planning Services: CM Consult   Living arrangements for the past 2 months: Single Family Home                                       Social Determinants of Health (SDOH) Interventions    Readmission Risk Interventions Readmission Risk Prevention Plan 08/23/2020 08/19/2020 08/14/2020  Transportation Screening Complete Complete Complete  PCP or Specialist Appt within 3-5 Days - - -  HRI or [place] - - -  Palliative Care Screening - - -  Medication Review ([place]) Complete Complete -  PCP or Specialist appointment within 3-5 days of discharge (No Data) Complete Complete  HRI or Home Care Consult Complete - Complete  SW Recovery Care/Counseling Consult - Complete Complete  Palliative Care Screening - Not Applicable Not [place] Not Applicable - Patient Refused  Some recent data might be hidden

## 2020-10-18 NOTE — ED Notes (Signed)
Social Worker at [employer] , collective

## 2020-10-18 NOTE — ED Notes (Signed)
Meal tray given 

## 2020-10-18 NOTE — ED Notes (Signed)
Report received from Mac RN. Patient care assumed. Patient/RN introduction complete. Will continue to monitor.

## 2020-10-18 NOTE — ED Notes (Signed)
D[employer] [place] at bedside

## 2020-10-18 NOTE — ED Provider Notes (Signed)
Emergency Medicine Observation Re-evaluation Note  Douglas Peterson. is a 69 y.o. male, seen on rounds today.  Pt initially presented to the ED for complaints of Alcohol Intoxication Currently, the patient is resting comfortably.  He has no acute complaints.  Physical Exam  BP (!) 157/92 (BP Location: Right Arm)   Pulse 80   Temp 98 F (36.7 C) (Oral)   Resp 18   Ht 5' 4"  (1.626 m)   Wt 54 kg   SpO2 100%   BMI 20.43 kg/m  Physical Exam General: Alert, comfortable appearing. Cardiac: Good peripheral perfusion. Lungs: Normal respiratory effort. Psych: Calm and cooperative.  ED Course / MDM   I have reviewed the labs performed to date as well as medications administered while in observation.  There have been no recent changes to the patient's status in the last 24 hours.  Plan  Current plan is for disposition based on [facility] team.    Arta Silence, MD 10/18/20 2239

## 2020-10-18 NOTE — ED Notes (Signed)
Pt resting comfortably with eyes closed, no distress noted.

## 2020-10-19 NOTE — ED Notes (Signed)
Report received from [employer]. Patient care assumed. Patient/RN introduction complete. Will continue to monitor.

## 2020-10-19 NOTE — ED Notes (Addendum)
First interaction with patient due to patient load acuity and unit staffing.

## 2020-10-19 NOTE — TOC Progression Note (Signed)
Transition of Care (TOC) - Progression Note    Patient Details  Name: Douglas Peterson. MRN: [id number] Date of Birth: 01-26-1951  Transition of Care [facility]) CM/SW [place], [place] Phone Number: [phone] 10/19/2020, 12:50 PM  Clinical Narrative:     Patient has bed offer at [place], [place].  Aida Raider (legal guardian) [facility] [phone] will come by to visit with the patient to speak with him about move to group home.  CSW will update EDP/ED Staff once I receive confirmation from Ms. Cottman.   Expected Discharge Plan: Skilled Nursing Facility Barriers to Discharge: ED Unsafe disposition,Other (comment),Unsafe home situation (pending guardianship paperwork and executor having access to funds, ALF pending bed offer)  Expected Discharge Plan and Services Expected Discharge Plan: [place]   Discharge Planning Services: CM Consult   Living arrangements for the past 2 months: Single Family Home                                       Social Determinants of Health (SDOH) Interventions    Readmission Risk Interventions Readmission Risk Prevention Plan 08/23/2020 08/19/2020 08/14/2020  Transportation Screening Complete Complete Complete  PCP or Specialist Appt within 3-5 Days - - -  HRI or [place] - - -  Palliative Care Screening - - -  Medication Review ([place]) Complete Complete -  PCP or Specialist appointment within 3-5 days of discharge (No Data) Complete Complete  HRI or Home Care Consult Complete - Complete  SW Recovery Care/Counseling Consult - Complete Complete  Palliative Care Screening - Not Applicable Not [place] Not Applicable - Patient Refused  Some recent data might be hidden

## 2020-10-19 NOTE — ED Notes (Signed)
Pt up in recliner drinking coffee.

## 2020-10-19 NOTE — Progress Notes (Signed)
Occupational Therapy Treatment Patient Details Name: Douglas Peterson. MRN: [id number] DOB: 07-04-1951 Today's Date: 10/19/2020    History of present illness [name]. is a 61YWV who comes to [facility] on 10/16 after fall at home. Pt found by EMS in his car with blood on back of head, pt reportedly fell in garage earlier in day, hitting head and buttocks. 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 Pt recently DC back to home on 10/12 after SIRS admission.   OT comments  [name] was seen for OT treatment on this date. Upon arrival to room pt reclined in bed, finished c breakfast and eager for session. MIN A don R shoe seated EOC, increased time using LUE only. MIN A wash RUE between digits. SBA + SPC for ~500 ft mobility. Pt making good progress toward goals. Pt continues to benefit from skilled OT services to maximize return to PLOF and minimize risk of future falls, injury, caregiver burden, and readmission. Will continue to follow POC. Discharge recommendation remains appropriate.    Follow Up Recommendations  Home health OT;Supervision/Assistance - 24 hour    Equipment Recommendations  None recommended by OT    Recommendations for Other Services      Precautions / Restrictions Precautions Precautions: Fall       Mobility Bed Mobility Overal bed mobility: Modified Independent                Transfers Overall transfer level: Modified independent Equipment used: Straight cane                  Balance Overall balance assessment: Needs assistance;History of Falls Sitting-balance support: Feet supported;No upper extremity supported Sitting balance-Leahy Scale: Good Sitting balance - Comments: Steady static sitting, reaching within BOS.   Standing balance support: Single extremity supported;During functional activity Standing balance-Leahy Scale: Good Standing balance comment:  Minimially reliant on 1 UE support on SPC t/o standing tasks.                           ADL either performed or assessed with clinical judgement   ADL Overall ADL's : Needs assistance/impaired                                       General ADL Comments: MIN A don R shoe seated EOC. MIN A wash RUE between digits. SBA + SPC for ~500 ft mobility               Cognition Arousal/Alertness: Awake/alert Behavior During Therapy: WFL for tasks assessed/performed Overall Cognitive Status: Within Functional Limits for tasks assessed                                          Exercises Exercises: Other exercises Other Exercises Other Exercises: Pt educated re: OT role, DME recs, d/c recs, falls prevention, ECS Other Exercises: PROM of RUE, LBD, ~500 ft mobility, sup>sit, sit<>stand x2, sitting/standing balance/tolerance           Pertinent Vitals/ Pain       Pain Assessment: No/denies pain         Frequency  Min 1X/week        Progress Toward Goals  OT Goals(current goals can now be found in the care plan section)  Progress towards OT goals: Progressing toward goals  Acute Rehab OT Goals Patient Stated Goal: To go home OT Goal Formulation: With patient Time For Goal Achievement: 10/29/20 Potential to Achieve Goals: Fair ADL Goals Pt Will Perform Grooming: with modified independence;standing Pt Will Perform Lower Body Dressing: with modified independence;sit to/from stand Pt Will Transfer to Toilet: with modified independence;ambulating;regular height toilet Pt Will Perform Toileting - Clothing Manipulation and hygiene: with modified independence;sit to/from stand Additional ADL Goal #1: Pt will independently verbalize a plan to implement at least 3 learned falls prevention strategies into his daily routines/home environment for improved safety and functional independence upon hospital DC.  Plan Discharge plan remains  appropriate;Frequency remains appropriate       AM-PAC OT "6 Clicks" Daily Activity     Outcome Measure   Help from another person eating meals?: None Help from another person taking care of personal grooming?: A Little Help from another person toileting, which includes using toliet, bedpan, or urinal?: A Little Help from another person bathing (including washing, rinsing, drying)?: A Little Help from another person to put on and taking off regular upper body clothing?: None Help from another person to put on and taking off regular lower body clothing?: A Little 6 Click Score: 20    End of Session    OT Visit Diagnosis: Other abnormalities of gait and mobility (R26.89);Muscle weakness (generalized) (M62.81);Repeated falls (R29.6)   Activity Tolerance Patient tolerated treatment well   Patient Left in chair   Nurse Communication Mobility status        Time: 5038-8828 OT Time Calculation (min): 24 min  Charges: OT General Charges $OT Visit: 1 Visit OT Treatments $Self Care/Home Management : 8-22 mins $Therapeutic Activity: 8-22 mins  Dessie Coma, M.S. OTR/L  10/19/20, 4:25 PM  ascom [phone]

## 2020-10-19 NOTE — ED Provider Notes (Signed)
Emergency Medicine Observation Re-evaluation Note  Douglas Peterson. is a 69 y.o. male, seen on rounds today.  Pt initially presented to the ED for complaints of Alcohol Intoxication Currently, the patient is resting.  Physical Exam  BP (!) 148/93 (BP Location: Left Arm)   Pulse 60   Temp 98 F (36.7 C) (Oral)   Resp 18   Ht 1.626 m (5' 4" )   Wt 54 kg   SpO2 96%   BMI 20.43 kg/m  Physical Exam Gen:  No acute distress Resp:  Breathing easily and comfortably, no accessory muscle usage Neuro:  Moving all four extremities, no gross focal neuro deficits Psych:  Resting currently, calm and cooperative when awake  ED Course / MDM  EKG:    I have reviewed the labs performed to date as well as medications administered while in observation.  Recent changes in the last 24 hours include no significant clinical changes.  Plan  Current plan is for TOC placement. [name] not under full IVC at this time.   Hinda Kehr, MD 10/19/20 [phone]

## 2020-10-19 NOTE — ED Notes (Signed)
APS at bedside

## 2020-10-20 NOTE — ED Notes (Signed)
Patient awake and alert, pleasant

## 2020-10-20 NOTE — ED Notes (Signed)
Pt provided decaf coffee per request.

## 2020-10-20 NOTE — ED Notes (Signed)
[name] a boarder.  Vitals every 4 hours

## 2020-10-20 NOTE — ED Notes (Signed)
Given dinner, no complaints voiced when asked

## 2020-10-21 NOTE — ED Notes (Signed)
Pt awake and alert, ambulatory to door to request coffee and use bathroom.

## 2020-10-21 NOTE — ED Notes (Signed)
Pt is sleeping at this time, will obtain vitals when awake.

## 2020-10-21 NOTE — Progress Notes (Signed)
Physical Therapy Treatment Patient Details Name: Douglas Peterson. MRN: [id number] DOB: 07-31-1951 Today's Date: 10/21/2020    History of Present Illness Douglas Peterson. is a 34HDQ who comes to [facility] after fall at home. Pt found by EMS in his car with blood on back of head, pt reportedly fell in garage earlier in day, hitting head and buttocks. 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 Pt recently DC back to home on 10/12 after SIRS admission.    PT Comments    OOB and ambulated 1000'+ with SPC and min guard/supervision.  No LOB's that needed interventions. Remained in recliner after session.  Follow Up Recommendations  Home health PT;Supervision - Intermittent     Equipment Recommendations  None recommended by PT    Recommendations for Other Services       Precautions / Restrictions Precautions Precautions: Fall Precaution Comments: old right clavicular fracture Required Braces or Orthoses:  (no sling needed as of 10/21 per Dr. Mack Guise) Restrictions Weight Bearing Restrictions: No Other Position/Activity Restrictions: Rt clavicle fx on 06/05/20    Mobility  Bed Mobility Overal bed mobility: Modified Independent                Transfers Overall transfer level: Modified independent                  Ambulation/Gait Ambulation/Gait assistance: Supervision Gait Distance (Feet): 1000 Feet Assistive device: Straight cane Gait Pattern/deviations: Step-through pattern;Decreased step length - right;Decreased step length - left;Trunk flexed Gait velocity: decreased   General Gait Details: Occasional R foot drag during swing through but pt able to self correct without LOB; good sequencing with the [facility]   Stairs             Wheelchair Mobility    Modified Rankin (Stroke Patients Only)       Balance Overall balance assessment: History of Falls Sitting-balance  support: Feet supported;No upper extremity supported Sitting balance-Leahy Scale: Good     Standing balance support: Single extremity supported;During functional activity Standing balance-Leahy Scale: Good Standing balance comment: Minimially reliant on 1 UE support on SPC t/o standing tasks.                            Cognition Arousal/Alertness: Awake/alert Behavior During Therapy: WFL for tasks assessed/performed Overall Cognitive Status: Within Functional Limits for tasks assessed                                        Exercises      General Comments        Pertinent Vitals/Pain Pain Assessment: No/denies pain    Home Living                      Prior Function            PT Goals (current goals can now be found in the care plan section) Progress towards PT goals: Progressing toward goals    Frequency    Min 2X/week      PT Plan Current plan remains appropriate    Co-evaluation              AM-PAC PT "6 Clicks" Mobility   Outcome Measure  Help needed turning from your back to your  side while in a flat bed without using bedrails?: None Help needed moving from lying on your back to sitting on the side of a flat bed without using bedrails?: None Help needed moving to and from a bed to a chair (including a wheelchair)?: None Help needed standing up from a chair using your arms (e.g., wheelchair or bedside chair)?: None Help needed to walk in hospital room?: A Little Help needed climbing 3-5 steps with a railing? : A Little 6 Click Score: 22    End of Session Equipment Utilized During Treatment: Gait belt Activity Tolerance: Patient tolerated treatment well Patient left: in chair;with call bell/phone within reach Nurse Communication: Mobility status PT Visit Diagnosis: Repeated falls (R29.6);Difficulty in walking, not elsewhere classified (R26.2);Unsteadiness on feet (R26.81)     Time: 0370-9643 PT Time  Calculation (min) (ACUTE ONLY): 27 min  Charges:  $Gait Training: 8-22 mins                    Chesley Noon, PTA 10/21/20, 1:31 PM

## 2020-10-21 NOTE — ED Notes (Signed)
Pt ambulated in the hall with PT, no distress.

## 2020-10-21 NOTE — ED Provider Notes (Signed)
Emergency Medicine Observation Re-evaluation Note  Douglas Peterson. is a 69 y.o. male, seen on rounds today.  Pt initially presented to the ED for complaints of Alcohol Intoxication Currently, the patient is resting, voices no medical complaints.  Physical Exam  BP 135/74 (BP Location: Right Arm)   Pulse 60   Temp 98 F (36.7 C) (Oral)   Resp 16   Ht 5' 4"  (1.626 m)   Wt 54 kg   SpO2 97%   BMI 20.43 kg/m  Physical Exam General: Resting in no acute distress Cardiac: No cyanosis Lungs: Equal rise and fall Psych: Not agitated  ED Course / MDM  EKG:    I have reviewed the labs performed to date as well as medications administered while in observation.  Recent changes in the last 24 hours include no events overnight.  Plan  Current plan is for SNF placement. [name] not under full IVC at this time.   Paulette Blanch, MD 10/21/20 [phone]

## 2020-10-21 NOTE — TOC Progression Note (Signed)
Transition of Care (TOC) - Progression Note    Patient Details  Name: Douglas Peterson. MRN: [id number] Date of Birth: 03-07-1951  Transition of Care [facility]) CM/SW [place], LCSW Phone Number: 10/21/2020, 9:42 AM  Clinical Narrative:    The patient is scheduled to be discharged on date pending group home search or ALF.  CSW call this patient's legal guardian [facility] APS [phone] for update on [name]'s group home placement.    Historical as of 10/21/2020:  The patient is a 69 year old Caucasian male who presented to [facility] ED via [place] 31 days ago (10/16) after a fall. EMR records indicated that [name] was found by EMS in his car with blood on back of head, [name] reportedly fell in garage earlier in the day (10/16) day, hitting head and buttocks. Patient has a medical history of GERD, depression PAF not on AC, ETOH abuse, Chronic R spastic hemiplegia due to h/o stroke. Pt recently DC  [place] on 10/12 after SIRS admission. [name] on room air. While in the hospital he has been receiving PT/OT. See notes.  Concerns: Patient lives alone, frequent ED visit, frequent admissions, and unable to take care of self. See notes.   Actions taken: [facility] CM/SW team reviewed [name]., case and a decision was made to collaborate with [facility] Department of Social Services APS. [facility] DSS/APS filed for [employer].   [place] [phone] has been appointed as the patient's Legal Guardian.   [name], daughter,  [phone]     Expected Discharge Plan: Skilled Nursing Facility Barriers to Discharge: ED Unsafe disposition,Other (comment),Unsafe home situation (pending guardianship paperwork and executor having access to funds, ALF pending bed offer)  Expected Discharge Plan and Services Expected Discharge Plan: [place]   Discharge Planning Services: CM Consult    Living arrangements for the past 2 months: Single Family Home                                       Social Determinants of Health (SDOH) Interventions    Readmission Risk Interventions Readmission Risk Prevention Plan 08/23/2020 08/19/2020 08/14/2020  Transportation Screening Complete Complete Complete  PCP or Specialist Appt within 3-5 Days - - -  HRI or [place] - - -  Palliative Care Screening - - -  Medication Review ([place]) Complete Complete -  PCP or Specialist appointment within 3-5 days of discharge (No Data) Complete Complete  HRI or Home Care Consult Complete - Complete  SW Recovery Care/Counseling Consult - Complete Complete  Palliative Care Screening - Not Applicable Not [place] Not Applicable - Patient Refused  Some recent data might be hidden

## 2020-10-21 NOTE — ED Notes (Signed)
Pt sitting at bedside in recliner. No request made at this time by pt.

## 2020-10-21 NOTE — ED Notes (Signed)
Pt resting comfortably at this time watching TV. Pt requests coffee, coffee provided. NAD noted. Pt denies any further needs. Call bell in reach.

## 2020-10-21 NOTE — ED Notes (Signed)
Pt resting in bed no verbal needs at this time.

## 2020-10-22 NOTE — ED Notes (Signed)
Pt given lunch tray and eating at this time.

## 2020-10-22 NOTE — TOC Progression Note (Signed)
Transition of Care (TOC) - Progression Note    Patient Details  Name: Rosco Harriott. MRN: [id number] Date of Birth: 1951-01-04  Transition of Care [facility]) CM/SW [place], [place] Phone Number: 10/22/2020, 9:00 AM  Clinical Narrative:     CSW received message from Aida Raider ([place]) [phone] stating the patient will be assessed by [facility]F for possible bed placement.  This is in spite of this CSW having set up placement for the patient at [place] and [facility] APS agreeing to this arrangement earlier in the day. Ms. Marisue [place] stated she would keep this CSW up-to-date on the assessment form [place].  Expected Discharge Plan: Skilled Nursing Facility Barriers to Discharge: ED Unsafe disposition,Other (comment),Unsafe home situation (pending guardianship paperwork and executor having access to funds, ALF pending bed offer)  Expected Discharge Plan and Services Expected Discharge Plan: [place]   Discharge Planning Services: CM Consult   Living arrangements for the past 2 months: Single Family Home                                       Social Determinants of Health (SDOH) Interventions    Readmission Risk Interventions Readmission Risk Prevention Plan 08/23/2020 08/19/2020 08/14/2020  Transportation Screening Complete Complete Complete  PCP or Specialist Appt within 3-5 Days - - -  HRI or [place] - - -  Palliative Care Screening - - -  Medication Review ([place]) Complete Complete -  PCP or Specialist appointment within 3-5 days of discharge (No Data) Complete Complete  HRI or Home Care Consult Complete - Complete  SW Recovery Care/Counseling Consult - Complete Complete  Palliative Care Screening - Not Applicable Not [place] Not Applicable - Patient Refused  Some recent data might be hidden

## 2020-10-22 NOTE — ED Notes (Signed)
Pt sitting comfortably in recliner at this time. This RN informed pt to make any needs known. Call bell in reach at this time, pt denies needs at this time.

## 2020-10-22 NOTE — ED Notes (Signed)
Pt aided in changing into clean gown at this time. Pt advised to let this RN know of future needs. Pt verbalized understanding, denies needs at this time.

## 2020-10-22 NOTE — TOC Progression Note (Signed)
Transition of Care (TOC) - Progression Note    Patient Details  Name: Douglas Peterson. MRN: [id number] Date of Birth: [birth year]  Transition of Care [facility]) CM/SW [place], [place] Phone Number: 10/22/2020, 12:59 PM  Clinical Narrative:     Patient pending assessment from [place], Aida Raider (Legal Guardian)  [phone], will contact CSW when she receives confirmation of [place] ALF representatives arriving to assess patient.  Expected Discharge Plan: Skilled Nursing Facility Barriers to Discharge: ED Unsafe disposition,Other (comment),Unsafe home situation (pending guardianship paperwork and executor having access to funds, ALF pending bed offer)  Expected Discharge Plan and Services Expected Discharge Plan: [place]   Discharge Planning Services: CM Consult   Living arrangements for the past 2 months: Single Family Home                                       Social Determinants of Health (SDOH) Interventions    Readmission Risk Interventions Readmission Risk Prevention Plan 08/23/2020 08/19/2020 08/14/2020  Transportation Screening Complete Complete Complete  PCP or Specialist Appt within 3-5 Days - - -  HRI or [place] - - -  Palliative Care Screening - - -  Medication Review ([place]) Complete Complete -  PCP or Specialist appointment within 3-5 days of discharge (No Data) Complete Complete  HRI or Home Care Consult Complete - Complete  SW Recovery Care/Counseling Consult - Complete Complete  Palliative Care Screening - Not Applicable Not [place] Not Applicable - Patient Refused  Some recent data might be hidden

## 2020-10-22 NOTE — ED Notes (Signed)
Pt given cup of coffee at this time.

## 2020-10-22 NOTE — ED Notes (Signed)
Pt sleeping. 

## 2020-10-22 NOTE — ED Notes (Signed)
Patient resting with eyes closed.  Non-labored respirations

## 2020-10-22 NOTE — ED Notes (Signed)
Pt resting in bed no needs at this time.

## 2020-10-22 NOTE — ED Notes (Signed)
Pt given breakfast tray and eating at this time.  [name] linens changed, warm blankets provided and clean gowns. Clean yellow socks and pt informed when he gets done eating we can get him changed and bathed.  [name] bath toiletries placed in container at sink

## 2020-10-22 NOTE — ED Notes (Addendum)
[place] representatives at bedside. Social worker called and Douglas Peterson will come and meet with representatives shortly.

## 2020-10-23 NOTE — Progress Notes (Signed)
OT Cancellation Note  Patient Details Name: Douglas Peterson. MRN: [id number] DOB: Oct 24, 1951   Cancelled Treatment:    Reason Eval/Treat Not Completed: Patient at procedure or test/ unavailable. OT attempted to see pt for tx session this date. Upon arrival to room, pt noted to be away from room. Per RN, already with PT for tx session. Will hold and re-attempt next date as available and pt medically appropriate for OT tx session.   Shara Blazing, M.S., OTR/L Ascom: [phone] 10/23/20, 2:07 PM

## 2020-10-23 NOTE — ED Notes (Signed)
Breakfast meal tray given at this time.  

## 2020-10-23 NOTE — Progress Notes (Signed)
Physical Therapy Treatment Patient Details Name: Douglas Peterson. MRN: [id number] DOB: [birth year] Today's Date: 10/23/2020    History of Present Illness Douglas Peterson. is a 12RFX who comes to [facility] on 10/16 after fall at home. Pt found by EMS in his car with blood on back of head, pt reportedly fell in garage earlier in day, hitting head and buttocks. 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 Pt recently DC back to home on 10/12 after SIRS admission.    PT Comments    Out of bed with ease and donned shoes with light min assist to get heel up.  He walks 25 minutes throughout department with [facility] and supervision.  Occasionally minor imbalances but recovers without assist.  Pt does report some fatigue today but given extended time and distance it is not unexpected.  Remained in recliner after session.   Follow Up Recommendations  Home health PT;Supervision - Intermittent     Equipment Recommendations       Recommendations for Other Services       Precautions / Restrictions Precautions Precautions: Fall Precaution Comments: old right clavicular fracture Required Braces or Orthoses:  (no sling needed as of 10/21 per Dr. Mack Peterson) Restrictions Weight Bearing Restrictions: No RUE Weight Bearing: Weight bearing as tolerated Other Position/Activity Restrictions: Rt clavicle fx on 06/05/20    Mobility  Bed Mobility Overal bed mobility: Modified Independent                Transfers Overall transfer level: Modified independent                  Ambulation/Gait                 Stairs             Wheelchair Mobility    Modified Rankin (Stroke Patients Only)       Balance Overall balance assessment: History of Falls;Modified Independent                                          Cognition Arousal/Alertness: Awake/alert Behavior During Therapy: WFL for  tasks assessed/performed Overall Cognitive Status: Within Functional Limits for tasks assessed                                        Exercises      General Comments        Pertinent Vitals/Pain Pain Assessment: No/denies pain    Home Living                      Prior Function            PT Goals (current goals can now be found in the care plan section) Progress towards PT goals: Progressing toward goals    Frequency    Min 2X/week      PT Plan Current plan remains appropriate    Co-evaluation              AM-PAC PT "6 Clicks" Mobility   Outcome Measure  Help needed turning from your back to your side while in a flat bed without using bedrails?: None Help needed moving from lying on your back to sitting on  the side of a flat bed without using bedrails?: None Help needed moving to and from a bed to a chair (including a wheelchair)?: None Help needed standing up from a chair using your arms (e.g., wheelchair or bedside chair)?: None Help needed to walk in hospital room?: A Little Help needed climbing 3-5 steps with a railing? : A Little 6 Click Score: 22    End of Session Equipment Utilized During Treatment: Gait belt Activity Tolerance: Patient tolerated treatment well Patient left: in chair;with call bell/phone within reach Nurse Communication: Mobility status       Time: 1353-1419 PT Time Calculation (min) (ACUTE ONLY): 26 min  Charges:  $Gait Training: 8-22 mins                    Douglas Peterson, PTA 10/23/20, 2:36 PM

## 2020-10-23 NOTE — ED Provider Notes (Signed)
Emergency Medicine Observation Re-evaluation Note  Douglas Peterson. is a 69 y.o. male, seen on rounds today.  Pt initially presented to the ED for complaints of Alcohol Intoxication Currently, the patient is sitting at bedside eating his dinner.  Physical Exam  BP 123/81 (BP Location: Left Arm)   Pulse 67   Temp 98 F (36.7 C) (Oral)   Resp 17   Ht 5' 4"  (1.626 m)   Wt 54 kg   SpO2 100%   BMI 20.43 kg/m  Physical Exam General: Alert, well oriented, in no distress and pleasant Cardiac: Warm and well perfused, warm extremities Lungs: Speaks in full and clear sentences without distress Psych: Mood calm and pleasant.  Oriented.  ED Course / MDM  EKG:    I have reviewed the labs performed to date as well as medications administered while in observation.  Recent changes in the last 24 hours include no significant update noted.  Plan  Current plan is for ongoing case management.    Delman Kitten, MD 10/23/20 [phone]

## 2020-10-23 NOTE — ED Notes (Signed)
VOLUNTARY/pending placement

## 2020-10-23 NOTE — ED Notes (Signed)
Lunch meal tray given at this time.

## 2020-10-23 NOTE — ED Notes (Signed)
PT at bedside.

## 2020-10-24 NOTE — ED Notes (Signed)
Breakfast tray given to patient.

## 2020-10-24 NOTE — ED Notes (Signed)
OT with patient.

## 2020-10-24 NOTE — ED Notes (Signed)
Cup of coffee provided

## 2020-10-24 NOTE — ED Notes (Signed)
Pt currently sleeping comfortably at this time.  Even and unlabored respirations noted by the rise and fall of his chest.

## 2020-10-24 NOTE — ED Notes (Signed)
Dinner at bedside.

## 2020-10-24 NOTE — Progress Notes (Signed)
Occupational Therapy Treatment Patient Details Name: Douglas Peterson. MRN: [id number] DOB: 08-18-1951 Today's Date: 10/24/2020    History of present illness Douglas Jagoda. is a 83JAS who comes [facility] on 10/16 after fall at home. Pt found by EMS in his car with blood on back of head, pt reportedly fell in garage earlier in day, hitting head and buttocks. 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 Pt recently DC back to home on 10/12 after SIRS admission.   OT comments  Douglas Peterson was seen for OT tx this date. Session focused on follow-up on safety strategies with functional mobility for community distances as well as energy conservation and activity pacing. Pt is able to ambulate >500 with min cueing to utilize therapeutic rest breaks and safety t/o. Prior to session, pt performs UB/LB dressing tasks and standing toileting/hand hygiene with MOD I to SUPERVISION for safety. He continues to demonstrate improved balance and activity tolerance. See ADL section below for additional details regarding occupational performance. Pt continues to benefit from skilled OT services to maximize return to PLOF and minimize risk of future falls, injury, caregiver burden, and readmission. Will continue to follow POC. Discharge recommendation remains appropriate.     Follow Up Recommendations  Home health OT;Supervision/Assistance - 24 hour    Equipment Recommendations  None recommended by OT    Recommendations for Other Services      Precautions / Restrictions Precautions Precautions: Fall Precaution Comments: old right clavicular fracture Restrictions Other Position/Activity Restrictions: Rt clavicle fx on 06/05/20       Mobility Bed Mobility Overal bed mobility: Modified Independent Bed Mobility: Supine to Sit     Supine to sit: HOB elevated        Transfers Overall transfer level: Modified independent Equipment  used: Straight cane Transfers: Sit to/from Stand Sit to Stand: Modified independent (Device/Increase time);Supervision              Balance   Sitting-balance support: Feet supported;No upper extremity supported Sitting balance-Leahy Scale: Good Sitting balance - Comments: Steady static sitting, reaching within BOS.   Standing balance support: Single extremity supported;During functional activity Standing balance-Leahy Scale: Good Standing balance comment: Minimially reliant on 1 UE support on SPC t/o standing tasks.                           ADL either performed or assessed with clinical judgement   ADL Overall ADL's : Needs assistance/impaired;Independent     Grooming: Wash/dry hands;Standing           Upper Body Dressing : Sitting;Supervision/safety Upper Body Dressing Details (indicate cue type and reason): Min cueing for safety during dressing tasks this date. Lower Body Dressing: Min guard;Sit to/from stand Lower Body Dressing Details (indicate cue type and reason): Pt able to don bilat shoes with MOD I while seated. He does not untie laces and is able to slip on shoes with fair LB access with his LUE. Toilet Transfer: Modified Engineer, manufacturing systems Details (indicate cue type and reason): Pt performs standing toileting at sink with MOD I using SPC for balance during transitional movement. Toileting- Clothing Manipulation and Hygiene: Modified independent;Sit to/from stand Toileting - Clothing Manipulation Details (indicate cue type and reason): Standing toileting at room commode.     Functional mobility during ADLs: Supervision/safety;Cane General ADL Comments: SBA + SPC for ~500 ft mobility  Vision Patient Visual Report: No change from baseline     Perception     Praxis      Cognition Arousal/Alertness: Awake/alert Behavior During Therapy: WFL for tasks assessed/performed Overall Cognitive Status: Within Functional  Limits for tasks assessed                                 General Comments: Pt remains A&Ox4 t/o session min cueing for safety and supervision for mobility t/o task.        Exercises Other Exercises Other Exercises: Pt education on falls prevention, DC recs, energy conservation strategies. Other Exercises: UBD, LBD, ~500 ft mobility, sup>sit, sit<>stand x2, standing toileting/grooming.   Shoulder Instructions       General Comments Vitals monitored t/o functional tasks/activity. Remain WNL t/o session.    Pertinent Vitals/ Pain       Pain Assessment: No/denies pain  Home Living                                          Prior Functioning/Environment              Frequency  Min 1X/week        Progress Toward Goals  OT Goals(current goals can now be found in the care plan section)  Progress towards OT goals: Progressing toward goals  Acute Rehab OT Goals Patient Stated Goal: To go home OT Goal Formulation: With patient Time For Goal Achievement: 10/29/20 Potential to Achieve Goals: [place] Discharge plan remains appropriate;Frequency remains appropriate    Co-evaluation                 AM-PAC OT "6 Clicks" Daily Activity     Outcome Measure   Help from another person eating meals?: None Help from another person taking care of personal grooming?: A Little Help from another person toileting, which includes using toliet, bedpan, or urinal?: A Little Help from another person bathing (including washing, rinsing, drying)?: A Little Help from another person to put on and taking off regular upper body clothing?: None Help from another person to put on and taking off regular lower body clothing?: A Little 6 Click Score: 20    End of Session Equipment Utilized During Treatment:  [facility])  OT Visit Diagnosis: Other abnormalities of gait and mobility (R26.89);Muscle weakness (generalized) (M62.81);Repeated falls (R29.6)    Activity Tolerance Patient tolerated treatment well   Patient Left in chair   Nurse Communication Mobility status        Time: 5726-2035 OT Time Calculation (min): 41 min  Charges: OT General Charges $OT Visit: 1 Visit OT Treatments $Self Care/Home Management : 8-22 mins $Therapeutic Activity: 23-37 mins   Shara Blazing, M.S., OTR/L Ascom: [phone] 10/24/20, 2:09 PM

## 2020-10-24 NOTE — ED Notes (Signed)
Report given to Noah RN

## 2020-10-24 NOTE — ED Notes (Signed)
Patient ate most of his dinner tray, all of his pizza and kept a cookie out for later.[name] on the phone. NAD.

## 2020-10-25 NOTE — ED Notes (Signed)
Pt given lunch tray.

## 2020-10-25 NOTE — ED Notes (Signed)
Dinner box set on bedside for patient per request

## 2020-10-25 NOTE — TOC Progression Note (Addendum)
Transition of Care (TOC) - Progression Note    Patient Details  Name: Douglas Peterson. MRN: [id number] Date of Birth: 14-Sep-1951  Transition of Care [facility]) CM/SW Contact  Meriel Flavors, [place] Phone Number: 10/25/2020, 3:04 PM  Clinical Narrative:    CSW confirmed patient has two bed offers, attempted to contact guardian Ms. Cottnan, no anwer, out for the holidays. Attempted contact Roni Toula Moos is no longer with DSS.   4:35pm CSW received a call from [place] center, both bed offers were retracted due to patient's current level of function   Expected Discharge Plan: [place] Barriers to Discharge: ED Unsafe disposition,Other (comment),Unsafe home situation (pending guardianship paperwork and executor having access to funds, ALF pending bed offer)  Expected Discharge Plan and Services Expected Discharge Plan: [place]   Discharge Planning Services: CM Consult   Living arrangements for the past 2 months: Single Family Home                                       Social Determinants of Health (SDOH) Interventions    Readmission Risk Interventions Readmission Risk Prevention Plan 08/23/2020 08/19/2020 08/14/2020  Transportation Screening Complete Complete Complete  PCP or Specialist Appt within 3-5 Days - - -  HRI or [place] - - -  Palliative Care Screening - - -  Medication Review ([place]) Complete Complete -  PCP or Specialist appointment within 3-5 days of discharge (No Data) Complete Complete  HRI or Home Care Consult Complete - Complete  SW Recovery Care/Counseling Consult - Complete Complete  Palliative Care Screening - Not Applicable Not [place] Not Applicable - Patient Refused  Some recent data might be hidden

## 2020-10-25 NOTE — ED Provider Notes (Signed)
Emergency Medicine Observation Re-evaluation Note  Douglas Peterson. is a 69 y.o. male, seen on rounds today.  Pt initially presented to the ED for complaints of Alcohol Intoxication Currently, the patient is resting comfortably.  Physical Exam  BP 116/81   Pulse 60   Temp 97.7 F (36.5 C) (Oral)   Resp 16   Ht 5' 4"  (1.626 m)   Wt 54 kg   SpO2 95%   BMI 20.43 kg/m  Physical Exam General: resting comfortably Cardiac: Normal rate and rhythm Lungs: Normal respiratory effort. No tachypnea. Psych: calm  ED Course / MDM  EKG:    I have reviewed the labs performed to date as well as medications administered while in observation.  Recent changes in the last 24 hours include none.  Plan  Current plan is for continued transition of care/social work work up. [name] not under full IVC at this time.   Nance Pear, MD 10/25/20 [id number]

## 2020-10-25 NOTE — Progress Notes (Signed)
Physical Therapy Treatment Patient Details Name: Douglas Peterson. MRN: [id number] DOB: May 31, 1951 Today's Date: 10/25/2020    History of Present Illness [name]. is a 70JJK who comes to [facility] on 10/16 after fall at home. Pt found by EMS in his car with blood on back of head, pt reportedly fell in garage earlier in day, hitting head and buttocks. 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 Pt recently DC back to home on 10/12 after SIRS admission.    PT Comments    Pt was sitting up in recliner eating upon arriving. He agrees to PT session and is cooperative throughout. Very eager to get up and ambulate. Was able to ambulate 250 ft with [facility] with CGA at first progressing to supervision. Did require Vcs for improved safety/gait kinematics. Cued for improved lateral wt shift to allow opposite LE advancement. Pt tolerated well. " I feel much better." Pt will continue to benefit from skilled PT at DC to address deficits while improving safe functional mobility/independence.    Follow Up Recommendations  Home health PT;Supervision - Intermittent;Supervision/Assistance - 24 hour;Supervision for mobility/OOB     Equipment Recommendations  None recommended by PT    Recommendations for Other Services       Precautions / Restrictions Precautions Precautions: Fall Precaution Comments: old right clavicular fracture Restrictions Weight Bearing Restrictions: No    Mobility  Bed Mobility Overal bed mobility: Modified Independent                Transfers Overall transfer level: Needs assistance Equipment used: Straight cane Transfers: Sit to/from Stand Sit to Stand: Supervision            Ambulation/Gait Ambulation/Gait assistance: Min guard;Supervision Gait Distance (Feet): 250 Feet Assistive device: Straight cane Gait Pattern/deviations: Step-through pattern Gait velocity: decreased    General Gait Details: Pt was able to ambulate ~ 250 ft with SPC + several standing rest. He tends to drag RLE once fatigues but with Vcs for improved wt shift is able to advance LE with less difficulty.   Stairs             Wheelchair Mobility    Modified Rankin (Stroke Patients Only)       Balance Overall balance assessment: History of Falls Sitting-balance support: Feet supported;No upper extremity supported Sitting balance-Leahy Scale: Good     Standing balance support: Single extremity supported;During functional activity Standing balance-Leahy Scale: Fair Standing balance comment: reliant on SPC                            Cognition Arousal/Alertness: Awake/alert Behavior During Therapy: WFL for tasks assessed/performed Overall Cognitive Status: Within Functional Limits for tasks assessed                                 General Comments: Pt was A and O x 2. Overall very pleasant and able to follow commands consistently      Exercises      General Comments        Pertinent Vitals/Pain Pain Assessment: No/denies pain Pain Score: 0-No pain    Home Living                      Prior Function  PT Goals (current goals can now be found in the care plan section) Acute Rehab PT Goals Patient Stated Goal: To go home Progress towards PT goals: Progressing toward goals    Frequency    Min 2X/week      PT Plan Current plan remains appropriate    Co-evaluation              AM-PAC PT "6 Clicks" Mobility   Outcome Measure  Help needed turning from your back to your side while in a flat bed without using bedrails?: None Help needed moving from lying on your back to sitting on the side of a flat bed without using bedrails?: None Help needed moving to and from a bed to a chair (including a wheelchair)?: None Help needed standing up from a chair using your arms (e.g., wheelchair or bedside chair)?:  None Help needed to walk in hospital room?: None Help needed climbing 3-5 steps with a railing? : A Little 6 Click Score: 23    End of Session Equipment Utilized During Treatment: Gait belt Activity Tolerance: Patient tolerated treatment well Patient left: in chair;with call bell/phone within reach Nurse Communication: Mobility status PT Visit Diagnosis: Repeated falls (R29.6);Difficulty in walking, not elsewhere classified (R26.2);Unsteadiness on feet (R26.81)     Time: 3343-5686 PT Time Calculation (min) (ACUTE ONLY): 18 min  Charges:  $Gait Training: 8-22 mins                     Julaine Fusi PTA 10/25/20, 11:00 AM

## 2020-10-26 NOTE — ED Notes (Signed)
Pt resting. NAD noted.

## 2020-10-26 NOTE — ED Notes (Signed)
Patient sitting on edge of bed. Meal tray provided and set up for patient.  Call bell within reach. No other needs expressed at this time.

## 2020-10-26 NOTE — TOC Progression Note (Signed)
Transition of Care (TOC) - Progression Note    Patient Details  Name: Douglas Peterson. MRN: [id number] Date of Birth: [birth year]  Transition of Care [facility]) CM/SW [place], [place] Phone Number:  [phone] 10/26/2020, 1:37 PM  Clinical Narrative:     [name] pending ALF placement at [facility].  CSW contacted and left message for Aida Raider (Legal Guardian) [phone] for update.  Expected Discharge Plan: Skilled Nursing Facility Barriers to Discharge: ED Unsafe disposition,Other (comment),Unsafe home situation (pending guardianship paperwork and executor having access to funds, ALF pending bed offer)  Expected Discharge Plan and Services Expected Discharge Plan: [place]   Discharge Planning Services: CM Consult   Living arrangements for the past 2 months: Single Family Home                                       Social Determinants of Health (SDOH) Interventions    Readmission Risk Interventions Readmission Risk Prevention Plan 08/23/2020 08/19/2020 08/14/2020  Transportation Screening Complete Complete Complete  PCP or Specialist Appt within 3-5 Days - - -  HRI or [place] - - -  Palliative Care Screening - - -  Medication Review ([place]) Complete Complete -  PCP or Specialist appointment within 3-5 days of discharge (No Data) Complete Complete  HRI or Home Care Consult Complete - Complete  SW Recovery Care/Counseling Consult - Complete Complete  Palliative Care Screening - Not Applicable Not [place] Not Applicable - Patient Refused  Some recent data might be hidden

## 2020-10-26 NOTE — ED Notes (Signed)
Coffee brought to pt. Pt voiced no other concerns.

## 2020-10-26 NOTE — ED Provider Notes (Signed)
----------------------------------------- °  6:28 AM on 10/26/2020 -----------------------------------------   Blood pressure 116/81, pulse 60, temperature 97.7 F (36.5 C), temperature source Oral, resp. rate 16, height 5' 4"  (1.626 m), weight 54 kg, SpO2 95 %.  The patient is calm and cooperative at this time.  There have been no acute events since the last update.  Awaiting disposition plan from Social Work team.   Paulette Blanch, MD 10/26/20 [phone]

## 2020-10-26 NOTE — ED Notes (Signed)
Pt resting in bed with equal unlabored respirations. Vitals will be reassessed when pt wakes in the morning. NAD noted at this time.

## 2020-10-27 NOTE — ED Notes (Signed)
Pt resting in bed playing on cell phone. Pt denies any needs. Pt remains alert and oriented, calm and cooperative with staff interaction. Pt denies any needs at this time, call bell within of patient at this time.

## 2020-10-27 NOTE — ED Notes (Signed)
Pt continues to rest in bed with NAD noted at this time. Pt visualized in NAD at this time. Explained will set patient up for a shower when bathroom is available. Pt states understanding.

## 2020-10-27 NOTE — ED Notes (Signed)
Pt resting in bed with eyes closed, respirations even and unlabored. Lights remain dimmed for patient comfort. Call bell remains within reach of patient.

## 2020-10-27 NOTE — ED Notes (Signed)
Patient given cup of coffee per request.

## 2020-10-27 NOTE — ED Provider Notes (Signed)
-----------------------------------------   6:10 AM on 10/27/2020 -----------------------------------------   Blood pressure 128/72, pulse 61, temperature 97.7 F (36.5 C), temperature source Oral, resp. rate 17, height 5' 4"  (1.626 m), weight 54 kg, SpO2 99 %.  The patient is calm and cooperative at this time.  There have been no acute events since the last update.  Awaiting disposition plan from Social Work team.   Paulette Blanch, MD 10/27/20 [phone]

## 2020-10-27 NOTE — ED Notes (Signed)
Pt visualized in NAD at this time. Pt resting in bed with NAD noted, call bell remains within reach of patient. Pt denies any needs at this time.

## 2020-10-27 NOTE — ED Notes (Signed)
Pt assisted to the shower by this Urban Gibson, EDT. Full linen change performed by this RN while patient in the shower.

## 2020-10-27 NOTE — ED Notes (Signed)
Medication administered per order. Pt tolerated well. Pt denies any needs at this time. Pt visualized in NAD at this time.

## 2020-10-27 NOTE — ED Notes (Signed)
Pt given meal tray.

## 2020-10-27 NOTE — ED Notes (Signed)
This RN to bedside, pt sitting up in recliner at this time playing on phone. This RN introduced self to patient. Pt denies any needs. Call bell placed within reach of patient at this time. Pt playing on phone at this time. Pt states understanding to use call bell should further needs arise.

## 2020-10-27 NOTE — ED Notes (Signed)
Pt visualized in NAD, continues to sit in chair and play on phone. This RN offered to call and order patient's breakfast for him, pt states has already called and ordered breakfast.

## 2020-10-27 NOTE — ED Notes (Signed)
VS obtained by this RN, pt sitting up on side of bed eating breakfast at this time. Pt visualized in NAD at this time.

## 2020-10-27 NOTE — ED Notes (Signed)
Medication administered per order. Pt tolerated well. Pt continues to sit on side of bed eating breakfast with NAD noted at this time. Pt denies further needs at this time.

## 2020-10-27 NOTE — ED Notes (Signed)
Pt visualized in NAD at this time, resting in recliner playing on cell phone. Pt denies any needs at this time.

## 2020-10-27 NOTE — ED Notes (Signed)
[name] sister currently at bedside to visit with patient.

## 2020-10-27 NOTE — ED Notes (Signed)
Pt visualized in NAD, sitting up in recliner eating lunch tray at this time. Pt denies further needs, call bell within reach.

## 2020-10-28 NOTE — ED Notes (Signed)
Patient resting comfortably in bedside chair. Patient given coffee. No needs at this time.

## 2020-10-28 NOTE — ED Provider Notes (Signed)
-----------------------------------------   6:41 AM on 10/28/2020 -----------------------------------------   Blood pressure 127/77, pulse (!) 53, temperature 97.9 F (36.6 C), temperature source Oral, resp. rate 16, height 1.626 m (5' 4" ), weight 54 kg, SpO2 96 %.  The patient is calm and cooperative at this time.  He woke up in the early morning hours, nodded to me and said good morning, was pleasant and in no distress.  Awaiting disposition plan from social work.   Hinda Kehr, MD 10/28/20 [phone]

## 2020-10-28 NOTE — ED Notes (Signed)
[name] resting comfortably. 

## 2020-10-28 NOTE — ED Notes (Signed)
Patient resting comfortably in bedside chair watching TV. No needs verbalized at this time.

## 2020-10-28 NOTE — ED Notes (Signed)
Patient resting comfortably in room. No needs expressed at this time.

## 2020-10-28 NOTE — ED Notes (Signed)
[name] vol pending placement

## 2020-10-28 NOTE — ED Notes (Signed)
Patient resting comfortably in bed. Denies pain, denies other needs at this time. Patient ate 100% supper tray.  Call bell within reach.

## 2020-10-28 NOTE — Evaluation (Signed)
Occupational Therapy Evaluation Patient Details Name: Douglas Peterson. MRN: [id number] DOB: 11-18-1950 Today's Date: 10/28/2020    History of Present Illness Douglas Peterson. is a 23JSE who comes to [facility] on 10/16 after fall at home. Pt found by EMS in his car with blood on back of head, pt reportedly fell in garage earlier in day, hitting head and buttocks. 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 Pt recently DC back to home on 10/12 after SIRS admission.   Clinical Impression   Mr Viverette was seen for OT re-evaluation this date 2/2 prolonged hospital stay and requiring goals updated 2/2 progress. Pt currently requires MOD I don B socks/shoes in sitting, MAX A to tie shoes. MOD I for ADL t/f  using SPC including ambulating >500 ft. Pt reports understanding he will likely not be d/c home, however states his goal is to do well and then return home. Pt would benefit from skilled OT to address noted impairments and functional limitations (see below for any additional details) in order to maximize safety and independence while minimizing falls risk and caregiver burden. Upon hospital discharge, recommend HHOT to maximize pt safety and return to functional independence during meaningful occupations of daily life.     Follow Up Recommendations  Home health OT;Supervision/Assistance - 24 hour    Equipment Recommendations  None recommended by OT    Recommendations for Other Services       Precautions / Restrictions Precautions Precautions: Fall Precaution Comments: old right clavicular fracture Restrictions Weight Bearing Restrictions: No      Mobility Bed Mobility Overal bed mobility: Modified Independent                  Transfers Overall transfer level: Modified independent Equipment used: Straight cane                  Balance Overall balance assessment: History of Falls Sitting-balance  support: Feet supported;No upper extremity supported Sitting balance-Leahy Scale: Good     Standing balance support: No upper extremity supported;During functional activity Standing balance-Leahy Scale: Fair                             ADL either performed or assessed with clinical judgement   ADL Overall ADL's : Needs assistance/impaired                                       General ADL Comments: MOD I don B socks/shoes in sitting, MAX A to tie shoes. MOD I for ADL t/f  using SPC                  Pertinent Vitals/Pain Pain Assessment: No/denies pain     Hand Dominance Left   Extremity/Trunk Assessment             Communication Communication Communication: No difficulties   Cognition Arousal/Alertness: Awake/alert Behavior During Therapy: WFL for tasks assessed/performed Overall Cognitive Status: Within Functional Limits for tasks assessed                                     General Comments       Exercises Exercises: Other exercises Other Exercises Other Exercises: Pt educated  re: OT role, DME recs, d/c recs, falls prevention, ECS, HEP Other Exercises: LBD, hair brushing, grooming, ~500 ft mobliity, sitting/standing balance/tolerance, simulated IADLS, bed making   Shoulder Instructions      Home Living Family/patient expects to be discharged to:: Assisted living                                        Prior Functioning/Environment Level of Independence: Independent with assistive device(s)  Gait / Transfers Assistance Needed: Primarily uses SPC at baseline. Pt endorses he has a cane in his car, garage, and house. Pt with multiple past admissions secondary to falls.  Pt reports being Ind with ADLs              OT Problem List: Decreased strength;Decreased activity tolerance;Decreased safety awareness;Impaired balance (sitting and/or standing)      OT Treatment/Interventions: Self-care/ADL  training;Therapeutic exercise;Therapeutic activities;Cognitive remediation/compensation;Energy conservation;Visual/perceptual remediation/compensation;DME and/or AE instruction;Patient/family education;Balance training    OT Goals(Current goals can be found in the care plan section) Acute Rehab OT Goals Patient Stated Goal: To go home OT Goal Formulation: With patient Time For Goal Achievement: 11/11/20 Potential to Achieve Goals: Fair ADL Goals Pt Will Perform Grooming: Independently;standing Pt Will Perform Lower Body Dressing: Independently;sit to/from stand Pt Will Transfer to Toilet: Independently;ambulating;regular height toilet Pt Will Perform Toileting - Clothing Manipulation and hygiene: Independently;sit to/from stand  OT Frequency: Min 1X/week   Barriers to D/C: Decreased caregiver support;Inaccessible home environment             AM-PAC OT "6 Clicks" Daily Activity     Outcome Measure Help from another person eating meals?: None Help from another person taking care of personal grooming?: None Help from another person toileting, which includes using toliet, bedpan, or urinal?: A Little Help from another person bathing (including washing, rinsing, drying)?: A Little Help from another person to put on and taking off regular upper body clothing?: None Help from another person to put on and taking off regular lower body clothing?: A Little 6 Click Score: 21   End of Session    Activity Tolerance: Patient tolerated treatment well Patient left: in chair  OT Visit Diagnosis: Other abnormalities of gait and mobility (R26.89);Muscle weakness (generalized) (M62.81);Repeated falls (R29.6)                Time: 5945-8592 OT Time Calculation (min): 27 min Charges:  OT General Charges $OT Visit: 1 Visit OT Evaluation $OT Re-eval: 1 Re-eval OT Treatments $Self Care/Home Management : 23-37 mins  Dessie Coma, M.S. OTR/L  10/28/20, 4:22 PM  ascom [phone]

## 2020-10-28 NOTE — ED Notes (Signed)
OT at bedside with patient.

## 2020-10-29 NOTE — ED Notes (Signed)
Patient eating dinner tray at this time.

## 2020-10-29 NOTE — ED Notes (Signed)
Patient visualized sleeping in bed. NAD noted, rise and fall of chest noted.

## 2020-10-29 NOTE — ED Notes (Signed)
Patient resting comfortably in bedside chair. No needs expressed at this time.

## 2020-10-29 NOTE — ED Provider Notes (Signed)
Emergency Medicine Observation Re-evaluation Note  Douglas Peterson. is a 69 y.o. male, seen on rounds today.  Pt initially presented to the ED for complaints of Alcohol Intoxication  Physical Exam  BP 134/73    Pulse 70    Temp 98 F (36.7 C) (Oral)    Resp 17    Ht 5' 4"  (1.626 m)    Wt 54 kg    SpO2 96%    BMI 20.43 kg/m  Physical Exam General: Patient sleeping comfortably Lungs: Patient in no respiratory distress Psych: Patient not agitated  ED Course / MDM  EKG:    Plan  Patient voluntary awaiting social work placement   Nena Polio, MD 10/29/20 [phone]

## 2020-10-29 NOTE — ED Notes (Signed)
[name] resting comfortably. 

## 2020-10-29 NOTE — TOC Progression Note (Addendum)
Transition of Care (TOC) - Progression Note    Patient Details  Name: Douglas Peterson. MRN: [id number] Date of Birth: 1951/06/14  Transition of Care [facility]) CM/SW [place], [place] Phone Number:  [phone] 10/29/2020, 2:33 PM  Clinical Narrative:     CSW contacted [facility] [phone], and was informed by administration they will not be able to admit the patient due to hx of alcohol use and possibility of flight risk.  CSW contacted and left voicemail to Aida Raider (Legal Guardian) [phone] to update on placement.  CSW called and left voicemail to [place] Supervisor [phone], updated her on difficulty placing patient, and updated her on need to d/c the patient home.    Expected Discharge Plan: Skilled Nursing Facility Barriers to Discharge: ED Unsafe disposition,Other (comment),Unsafe home situation (pending guardianship paperwork and executor having access to funds, ALF pending bed offer)  Expected Discharge Plan and Services Expected Discharge Plan: [place]   Discharge Planning Services: CM Consult   Living arrangements for the past 2 months: Single Family Home                                       Social Determinants of Health (SDOH) Interventions    Readmission Risk Interventions Readmission Risk Prevention Plan 08/23/2020 08/19/2020 08/14/2020  Transportation Screening Complete Complete Complete  PCP or Specialist Appt within 3-5 Days - - -  HRI or [place] - - -  Palliative Care Screening - - -  Medication Review ([place]) Complete Complete -  PCP or Specialist appointment within 3-5 days of discharge (No Data) Complete Complete  HRI or Home Care Consult Complete - Complete  SW Recovery Care/Counseling Consult - Complete Complete  Palliative Care Screening - Not Applicable Not [place] Not Applicable - Patient Refused  Some recent  data might be hidden

## 2020-10-29 NOTE — ED Notes (Signed)
Patient eating lunch tray at this time °

## 2020-10-30 NOTE — Progress Notes (Signed)
Physical Therapy Treatment Patient Details Name: Douglas Peterson. MRN: [id number] DOB: June 08, 1951 Today's Date: 10/30/2020    History of Present Illness Douglas Peterson. is a 89XTK who comes to [facility] on 10/16 after fall at home. Pt found by EMS in his car with blood on back of head, pt reportedly fell in garage earlier in day, hitting head and buttocks. 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 Pt recently DC back to home on 10/12 after SIRS admission.    PT Comments    Dressed and walking in room without assist.  Reports no falls. Pt is able to walk 20+ minutes with SPC in hallway with supervision for safety.  Self corrects minor imbalances.    Follow Up Recommendations  Home health PT;Supervision - Intermittent;Supervision/Assistance - 24 hour;Supervision for mobility/OOB     Equipment Recommendations  None recommended by PT    Recommendations for Other Services       Precautions / Restrictions Precautions Precautions: Fall Precaution Comments: old right clavicular fracture Restrictions Weight Bearing Restrictions: No    Mobility  Bed Mobility Overal bed mobility: Modified Independent                Transfers Overall transfer level: Modified independent                  Ambulation/Gait Ambulation/Gait assistance: Supervision;Modified independent (Device/Increase time) Gait Distance (Feet): 1000 Feet Assistive device: Straight cane   Gait velocity: decreased   General Gait Details: ambulated 20 minutes without rest.  self corrects minor imbalances   Stairs             Wheelchair Mobility    Modified Rankin (Stroke Patients Only)       Balance Overall balance assessment: History of Falls;Modified Independent Sitting-balance support: Feet supported;No upper extremity supported Sitting balance-Leahy Scale: Good     Standing balance support: Single extremity  supported Standing balance-Leahy Scale: Good                              Cognition Arousal/Alertness: Awake/alert Behavior During Therapy: WFL for tasks assessed/performed Overall Cognitive Status: Within Functional Limits for tasks assessed                                        Exercises      General Comments        Pertinent Vitals/Pain Pain Assessment: No/denies pain    Home Living                      Prior Function            PT Goals (current goals can now be found in the care plan section) Progress towards PT goals: Progressing toward goals    Frequency    Min 2X/week      PT Plan Current plan remains appropriate    Co-evaluation              AM-PAC PT "6 Clicks" Mobility   Outcome Measure  Help needed turning from your back to your side while in a flat bed without using bedrails?: None Help needed moving from lying on your back to sitting on the side of a flat bed without using bedrails?: None Help needed moving to  and from a bed to a chair (including a wheelchair)?: None Help needed standing up from a chair using your arms (e.g., wheelchair or bedside chair)?: None Help needed to walk in hospital room?: None Help needed climbing 3-5 steps with a railing? : A Little 6 Click Score: 23    End of Session Equipment Utilized During Treatment: Gait belt Activity Tolerance: Patient tolerated treatment well Patient left: in chair;with call bell/phone within reach Nurse Communication: Mobility status PT Visit Diagnosis: Repeated falls (R29.6);Difficulty in walking, not elsewhere classified (R26.2);Unsteadiness on feet (R26.81)     Time: 2703-5009 PT Time Calculation (min) (ACUTE ONLY): 25 min  Charges:  $Gait Training: 23-37 mins                    Chesley Noon, PTA 10/30/20, 9:22 AM

## 2020-10-30 NOTE — ED Notes (Signed)
Pt eating breakfast at this time.  

## 2020-10-31 NOTE — ED Notes (Signed)
SW and Case Manager in room with pt to discuss placement and discharge options.   Per pt history and current legal guardianship, pt is not eligible to be d/c'd home with home health. Case manager states current intent is to d/c pt to a group home, where he will be watched for stability. She states that if pt does well, pt may be eligible for d/c home and legal guardianship revoked in approx 3-6 months  Pt is increasingly dissatisfied with group home plan. Pt Does not want to share a bedroom with anyone, though we are told that this living situation that will no be the case.

## 2020-10-31 NOTE — TOC Progression Note (Signed)
Transition of Care (TOC) - Progression Note    Patient Details  Name: Douglas Peterson. MRN: [id number] Date of Birth: 1951-07-18  Transition of Care [facility]) CM/SW [place], [place] Phone Number: [phone] 10/31/2020, 2:14 PM  Clinical Narrative:     Patient met with [place] [phone], [place] [phone], [place] APS and this CSW.  After a long conversation patient has agreed to go to [place].  Ms. Marisue Brooklyn and Mr. Harrington Challenger will let this CSW know whne transportation will occur.  Possible d/c date Saturday Jan. 1st, 2022.   Expected Discharge Plan: Skilled Nursing Facility Barriers to Discharge: ED Unsafe disposition,Other (comment),Unsafe home situation (pending guardianship paperwork and executor having access to funds, ALF pending bed offer)  Expected Discharge Plan and Services Expected Discharge Plan: [place]   Discharge Planning Services: CM Consult   Living arrangements for the past 2 months: Single Family Home                                       Social Determinants of Health (SDOH) Interventions    Readmission Risk Interventions Readmission Risk Prevention Plan 08/23/2020 08/19/2020 08/14/2020  Transportation Screening Complete Complete Complete  PCP or Specialist Appt within 3-5 Days - - -  HRI or [place] - - -  Palliative Care Screening - - -  Medication Review ([place]) Complete Complete -  PCP or Specialist appointment within 3-5 days of discharge (No Data) Complete Complete  HRI or Home Care Consult Complete - Complete  SW Recovery Care/Counseling Consult - Complete Complete  Palliative Care Screening - Not Applicable Not [place] Not Applicable - Patient Refused  Some recent data might be hidden

## 2020-10-31 NOTE — ED Notes (Signed)
Pt states he was told he would be potentially going to [facility] on Monday. Pt states he was told it was in [place], but can only find the location of the [place] location online. This is the only location I was able to verify as well.  Pt reports after investigating on his own, that the address might be   [address],  [place]

## 2020-10-31 NOTE — ED Notes (Signed)
[name] sister here to visit with him

## 2020-10-31 NOTE — TOC Progression Note (Signed)
Transition of Care (TOC) - Progression Note    Patient Details  Name: Douglas Peterson. MRN: [id number] Date of Birth: 05-05-1951  Transition of Care [facility]) CM/SW [place], [place] Phone Number: [phone] 10/31/2020, 9:35 AM  Clinical Narrative:     CSW spoke with patient and informed him [place] declined to offer placement due to their concerns he is a flight risk.  Patient expressed his desire to return home and not go to ALF or group home.  CSW stated he still had a placement offer at [facility] and they were the only ones offering him placement.  Patient stated he did not understand why he couldn't go back home.  CSw reminded him that we had tried different ways for him to return home, like getting a Charity fundraiser but his APS guardian did not approve that idea.  [name] concerned about his finances being handled by someone else and someone else making decisions for him.  CSW stated that he may be able to petition to regain his autonomy if he participated with the plans that were being put in place including not returning home and going to a group home.  CSW reminded patient if he stayed sober for at least 6 months that would be helpful in demonstrating he had full capacity to make his own decisions.  Patient stated he had capacity now.  CSW agreed but reminded patient he had been in the hospital for 40 days and did not have access to alcohol during that time.  CSW reminded patient the concern APS expressed was the patient returning home is that he would begin drinking again. Patient stated he would not do it again, and CSW stated he would need to prove to that for a longer period of time.   Patient expressed he was concerned he would not be able to physically handle living in a group home and he also expressed concern about "the types of people that live in a group home." CSW reminded patient he has never been to a group home before and he does not know  what type of people will live there.  CSW also reminded patient his choices were limited because he has a guardian.  Patient once again argued about the unfairness of the guardianship process and how he did not receive appropiate legal representation representation.  CSW reminded patient if he felt the process was unfair it would be more appropriate for him to begin inquiries about that outside of the hospital. CSW encouraged patient to give the group home setting a chance.   [name] still distraught about group home placement and loss of autonomy.  Patient continues to state he would like to return home and he expressed frustration and fer of the state taking charge of his finances and managing them.  CSW expressed understanding of his frustration and encouraged him to speak with his guardian about his concerns.  At this time CSW has confirmation patient will be transported to [facility] on 10/31/2020.  Expected Discharge Plan: [place] Barriers to Discharge: ED Unsafe disposition,Other (comment),Unsafe home situation (pending guardianship paperwork and executor having access to funds, ALF pending bed offer)  Expected Discharge Plan and Services Expected Discharge Plan: [place]   Discharge Planning Services: CM Consult   Living arrangements for the past 2 months: [place]  Social Determinants of Health (SDOH) Interventions    Readmission Risk Interventions Readmission Risk Prevention Plan 08/23/2020 08/19/2020 08/14/2020  Transportation Screening Complete Complete Complete  PCP or Specialist Appt within 3-5 Days - - -  HRI or [place] - - -  Palliative Care Screening - - -  Medication Review ([place]) Complete Complete -  PCP or Specialist appointment within 3-5 days of discharge (No Data) Complete Complete  HRI or Home Care Consult Complete - Complete  SW Recovery  Care/Counseling Consult - Complete Complete  Palliative Care Screening - Not Applicable Not [place] Not Applicable - Patient Refused  Some recent data might be hidden

## 2020-10-31 NOTE — TOC Progression Note (Signed)
Transition of Care (TOC) - Progression Note    Patient Details  Name: Douglas Peterson. MRN: [id number] Date of Birth: July 01, 1951  Transition of Care [facility]) CM/SW [place], [place] Phone Number: [phone] 10/31/2020, 9:53 AM  Clinical Narrative:     CSW received call from [place] [phone] [facility], with update that patient wil not transfer to group home until Saturday 11/03/2020.  CSW confirmed this with Aida Raider ([place]) [phone].  Ms. Marisue Brooklyn and [place] will come by to speak with patient today at 11:00AM.  CSW will meet them at ED lobby.  Expected Discharge Plan: Skilled Nursing Facility Barriers to Discharge: ED Unsafe disposition,Other (comment),Unsafe home situation (pending guardianship paperwork and executor having access to funds, ALF pending bed offer)  Expected Discharge Plan and Services Expected Discharge Plan: [place]   Discharge Planning Services: CM Consult   Living arrangements for the past 2 months: Single Family Home                                       Social Determinants of Health (SDOH) Interventions    Readmission Risk Interventions Readmission Risk Prevention Plan 08/23/2020 08/19/2020 08/14/2020  Transportation Screening Complete Complete Complete  PCP or Specialist Appt within 3-5 Days - - -  HRI or [place] - - -  Palliative Care Screening - - -  Medication Review ([place]) Complete Complete -  PCP or Specialist appointment within 3-5 days of discharge (No Data) Complete Complete  HRI or Home Care Consult Complete - Complete  SW Recovery Care/Counseling Consult - Complete Complete  Palliative Care Screening - Not Applicable Not [place] Not Applicable - Patient Refused  Some recent data might be hidden

## 2020-10-31 NOTE — ED Notes (Signed)
Pt expresses discontent with intent to d/c to group home, stating "I don't want to be an ass, but I really don't want to go to a group home." Pt states that he wants to go back home, "I'd even pay for someone to come check on me and make sure I'm not drinking or anything. It seems ridiculous that I lose my home and everything over this, I can take care of myself."  Pt given cup of coffee per request.   Will investigate possibility of home d/c with home health or wellness checks, perhaps other options per SW.   Pt calm, polite, noted to ambulate self to toilet for BM at approx 0710. Pt cleans up own trash and straightens living area. Denies further needs/complaints. Pt clean and orderly in appearance, A/O x4

## 2020-10-31 NOTE — ED Notes (Signed)
Report received from [place], [place] at this time, pt resting comfortably in recliner at this time. No complaints.

## 2020-10-31 NOTE — ED Notes (Signed)
Pt up to chair. Milk and coffee provided to pt. Call bell within reach

## 2020-10-31 NOTE — ED Notes (Signed)
Able to confirm with secretary that the [address] address is correct, and is a sister facility to [facility]  Phone number [phone]

## 2020-10-31 NOTE — ED Notes (Signed)
Pt laying in bed. NAD noted. Pt is on hospital bed, eyes closed and is resting. Call bell on a side rail within reach of pt.

## 2020-11-01 NOTE — ED Provider Notes (Signed)
Emergency Medicine Observation Re-evaluation Note  Douglas Peterson. is a 69 y.o. male, seen on rounds today.  Pt initially presented to the ED for complaints of Alcohol Intoxication Currently, the patient is, no complaints this morning.  Physical Exam  BP (!) 152/81   Pulse 64   Temp 98.1 F (36.7 C) (Oral)   Resp 16   Ht 5' 4"  (1.626 m)   Wt 54 kg   SpO2 96%   BMI 20.43 kg/m  Physical Exam General: No apparent distress HEENT: moist mucous membranes CV: RRR Pulm: Normal WOB GI: soft and non tender MSK: no edema or cyanosis Neuro: face symmetric, moving all extremities    ED Course / MDM  EKG:    I have reviewed the labs performed to date as well as medications administered while in observation.  No acute changes overnight or new labs this morning.  Plan  Current plan is for placement.    Rudene Re, MD 11/01/20 [phone]

## 2020-11-01 NOTE — ED Notes (Signed)
Report to include Situation, Background, Assessment, and Recommendations received from Amy RN. Patient alert and oriented, warm and dry, in no acute distress. Patient denies SI, HI, AVH and pain. Patient made aware of Q15 minute rounds and Engineer, drilling presence for their safety. Patient instructed to come to me with needs or concerns.

## 2020-11-01 NOTE — Progress Notes (Signed)
Physical Therapy Treatment Patient Details Name: Douglas Peterson. MRN: [id number] DOB: 1951/05/15 Today's Date: 11/01/2020    History of Present Illness Douglas Peterson. is a 40JWJ who [facility] on [facility] after fall at home. Pt found by EMS in his car with blood on back of head, pt reportedly fell in garage earlier in day, hitting head and buttocks. 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 Pt recently DC back to home on 10/12 after SIRS admission.    PT Comments    Pt was sitting in recliner upon arriving. He agrees to PT and is cooperative and pleasant throughout. Much more talkative and conversational today. Pt was able to stand from recliner with supervision, ambulate with SPC >500 ft without c/o fatigue. He does continue to have gait deficits from previous stroke however overall demonstrates improved safety.  Pt voices several times he wishes he could go home however did discuss safety concerns and past re-admission rate due substance abuse. He is understanding/agreeable to DC to group home situation. Will benefit from HHPT at group home to continue to progress independence with all ADL.    Follow Up Recommendations  Home health PT;Supervision for mobility/OOB     Equipment Recommendations  None recommended by PT    Recommendations for Other Services       Precautions / Restrictions Precautions Precautions: Fall Precaution Comments: old right clavicular fracture Restrictions Weight Bearing Restrictions: No RUE Weight Bearing: Weight bearing as tolerated Other Position/Activity Restrictions: Rt clavicle fx on 06/05/20    Mobility  Bed Mobility      General bed mobility comments: pt was in recliner pre/post session  Transfers Overall transfer level: Needs assistance Equipment used: Straight cane Transfers: Sit to/from Stand Sit to Stand: Supervision         General transfer comment: Pt  performed STS from recliner and transport chair 2 x each. supervision for safety.  Ambulation/Gait Ambulation/Gait assistance: Supervision Gait Distance (Feet): 500 Feet Assistive device: Straight cane Gait Pattern/deviations: Step-through pattern (occasional toe drag due to prior stroke) Gait velocity: decreased   General Gait Details: Pt ambulated from ED to medical mall and outside priro to mbulating back to room. no c/o fatigue. Overall doing extremely well.       Balance Overall balance assessment: History of Falls Sitting-balance support: Feet supported;No upper extremity supported Sitting balance-Leahy Scale: Good Sitting balance - Comments: Steady static sitting, reaching within BOS.   Standing balance support: Single extremity supported Standing balance-Leahy Scale: Good Standing balance comment: reliant on SPC           Cognition Arousal/Alertness: Awake/alert Behavior During Therapy: WFL for tasks assessed/performed Overall Cognitive Status: Within Functional Limits for tasks assessed          General Comments: Pt was A and O x 4. Most conversational pt has ever been with Chief Strategy Officer who has extensisve history working with pt.      Exercises Other Exercises Other Exercises: Pt educated re: safety awareness/scanning environment. Pt engaged in fxl mobility throughout facility with cane with SUPV with MIN cues to initiate standin grest breaks when he was notably fatigued. Pt tolerated session well. Left in chair with breakfast.        Pertinent Vitals/Pain Pain Assessment: No/denies pain           PT Goals (current goals can now be found in the care plan section) Acute Rehab PT Goals Patient Stated  Goal: go home but I know I can't Progress towards PT goals: Progressing toward goals    Frequency    Min 2X/week      PT Plan Current plan remains appropriate       AM-PAC PT "6 Clicks" Mobility   Outcome Measure  Help needed turning from your back  to your side while in a flat bed without using bedrails?: None Help needed moving from lying on your back to sitting on the side of a flat bed without using bedrails?: None Help needed moving to and from a bed to a chair (including a wheelchair)?: None Help needed standing up from a chair using your arms (e.g., wheelchair or bedside chair)?: A Little Help needed to walk in hospital room?: A Little Help needed climbing 3-5 steps with a railing? : A Little 6 Click Score: 21    End of Session Equipment Utilized During Treatment: Gait belt Activity Tolerance: Patient tolerated treatment well Patient left: in chair;with call bell/phone within reach Nurse Communication: Mobility status PT Visit Diagnosis: Repeated falls (R29.6);Difficulty in walking, not elsewhere classified (R26.2);Unsteadiness on feet (R26.81)     Time: 3016-0109 PT Time Calculation (min) (ACUTE ONLY): 13 min  Charges:  $Gait Training: 8-22 mins                     Julaine Fusi PTA 11/01/20, 3:30 PM

## 2020-11-01 NOTE — ED Notes (Signed)
Hourly rounding reveals patient in room. No complaints, stable, in no acute distress. Q15 minute rounds and monitoring via Engineer, drilling to continue.

## 2020-11-01 NOTE — ED Notes (Signed)
Took over care of pt. Pt ambulating with a steady gait. Pt in NAD at this time. VSS. Awaiting further orders. Will continue to monitor.

## 2020-11-01 NOTE — TOC Progression Note (Signed)
Transition of Care (TOC) - Progression Note    Patient Details  Name: Douglas Peterson. MRN: [id number] Date of Birth: 15-Jul-1951  Transition of Care [facility]) CM/SW [place], [place] Phone Number: [phone] 11/01/2020, 5:19 PM  Clinical Narrative:     Plan for d/c is for Saturday 11/03/2020.  Patient will d/c to [place], [name] [phone], will transport.  CSW updated Aida Raider APS guardian [phone] on d/c plan.  CSW updated patient and he was agreeable.   Expected Discharge Plan: Skilled Nursing Facility Barriers to Discharge: ED Unsafe disposition,Other (comment),Unsafe home situation (pending guardianship paperwork and executor having access to funds, ALF pending bed offer)  Expected Discharge Plan and Services Expected Discharge Plan: [place]   Discharge Planning Services: CM Consult   Living arrangements for the past 2 months: Single Family Home                                       Social Determinants of Health (SDOH) Interventions    Readmission Risk Interventions Readmission Risk Prevention Plan 08/23/2020 08/19/2020 08/14/2020  Transportation Screening Complete Complete Complete  PCP or Specialist Appt within 3-5 Days - - -  HRI or [place] - - -  Palliative Care Screening - - -  Medication Review ([place]) Complete Complete -  PCP or Specialist appointment within 3-5 days of discharge (No Data) Complete Complete  HRI or Home Care Consult Complete - Complete  SW Recovery Care/Counseling Consult - Complete Complete  Palliative Care Screening - Not Applicable Not [place] Not Applicable - Patient Refused  Some recent data might be hidden

## 2020-11-01 NOTE — Progress Notes (Signed)
Occupational Therapy Treatment Patient Details Name: Douglas Peterson. MRN: [id number] DOB: May 06, 1951 Today's Date: 11/01/2020    History of present illness Douglas Peterson. is a 37TGG who comes to [facility] on 10/16 after fall at home. Pt found by EMS in his car with blood on back of head, pt reportedly fell in garage earlier in day, hitting head and buttocks. 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 Pt recently DC back to home on 10/12 after SIRS admission.   OT comments  Pt seen for OT tx this date to f/u re: safety with ADLs/ADL mobility. Pt educated re: Microbiologist environment. Pt engaged in fxl mobility throughout facility with cane with SUPV with MIN cues to initiate standin grest breaks when he was notably fatigued. Pt tolerated session well. Left in chair with breakfast. Continue to anticipate pt will benefit from therapy f/u outside of acute setting.    Follow Up Recommendations  Home health OT;Supervision/Assistance - 24 hour    Equipment Recommendations  None recommended by OT    Recommendations for Other Services      Precautions / Restrictions Precautions Precautions: Fall Precaution Comments: old right clavicular fracture Restrictions Weight Bearing Restrictions: No RUE Weight Bearing: Weight bearing as tolerated Other Position/Activity Restrictions: Rt clavicle fx on 06/05/20       Mobility Bed Mobility               General bed mobility comments: Deferred. Pt in recliner at start/end of session.  Transfers Overall transfer level: Needs assistance Equipment used: Straight cane Transfers: Sit to/from Stand Sit to Stand: Supervision         General transfer comment: Pt demonstrates safe use of SPC during STS/mobility    Balance Overall balance assessment: History of Falls;Modified Independent Sitting-balance support: Feet supported;No upper extremity  supported Sitting balance-Leahy Scale: Good Sitting balance - Comments: Steady static sitting, reaching within BOS.   Standing balance support: Single extremity supported Standing balance-Leahy Scale: Good Standing balance comment: reliant on SPC                           ADL either performed or assessed with clinical judgement   ADL Overall ADL's : Needs assistance/impaired             Lower Body Bathing: Min guard;Sit to/from stand;Cueing for safety Lower Body Bathing Details (indicate cue type and reason): MIN A to assist with R shoe, pt could benefit from shoe horn                     Functional mobility during ADLs: Supervision/safety;Cane (cues to avoid obstacles on R side very minimally during session as well as 2 cues to take standing rest breaks when is noted to be fatiguing as evidenced by R LE shaking.)       Vision Patient Visual Report: No change from baseline     Perception     Praxis      Cognition Arousal/Alertness: Awake/alert Behavior During Therapy: WFL for tasks assessed/performed Overall Cognitive Status: Within Functional Limits for tasks assessed                                 General Comments: Pt A&O and pleasant throughout session        Exercises Other Exercises Other Exercises:  Pt educated re: Microbiologist environment. Pt engaged in fxl mobility throughout facility with cane with SUPV with MIN cues to initiate standin grest breaks when he was notably fatigued. Pt tolerated session well. Left in chair with breakfast.   Shoulder Instructions       General Comments      Pertinent Vitals/ Pain       Pain Assessment: No/denies pain  Home Living                                          Prior Functioning/Environment              Frequency  Min 1X/week        Progress Toward Goals  OT Goals(current goals can now be found in the care plan section)  Progress  towards OT goals: Progressing toward goals  Acute Rehab OT Goals Patient Stated Goal: to get stronger and be safe once I leave OT Goal Formulation: With patient Time For Goal Achievement: 11/11/20 Potential to Achieve Goals: Good  Plan Discharge plan remains appropriate;Frequency remains appropriate    Co-evaluation                 AM-PAC OT "6 Clicks" Daily Activity     Outcome Measure   Help from another person eating meals?: None Help from another person taking care of personal grooming?: None Help from another person toileting, which includes using toliet, bedpan, or urinal?: A Little Help from another person bathing (including washing, rinsing, drying)?: A Little Help from another person to put on and taking off regular upper body clothing?: None Help from another person to put on and taking off regular lower body clothing?: A Little 6 Click Score: 21    End of Session Equipment Utilized During Treatment: Other (comment) (SPC)  OT Visit Diagnosis: Other abnormalities of gait and mobility (R26.89);Muscle weakness (generalized) (M62.81);Repeated falls (R29.6)   Activity Tolerance Patient tolerated treatment well   Patient Left in chair;with call bell/phone within reach   Nurse Communication Mobility status        Time: 0920-0958 OT Time Calculation (min): 38 min  Charges: OT General Charges $OT Visit: 1 Visit OT Treatments $Self Care/Home Management : 8-22 mins $Therapeutic Activity: 23-37 mins   Jake Church. , MS, OTR/L  11/01/20,  12:19 PM [phone]

## 2020-11-01 NOTE — ED Notes (Signed)
Vol  pending  placement

## 2020-11-02 NOTE — ED Notes (Signed)
VS not taken, patient asleep

## 2020-11-02 NOTE — ED Notes (Signed)
Hourly rounding reveals patient in room. No complaints, stable, in no acute distress. Q15 minute rounds and monitoring via Engineer, drilling to continue.

## 2020-11-02 NOTE — ED Notes (Signed)
Dinner tray given

## 2020-11-02 NOTE — ED Notes (Signed)
Breakfast tray given. °

## 2020-11-02 NOTE — ED Provider Notes (Signed)
-----------------------------------------   5:32 AM on 11/02/2020 -----------------------------------------   Blood pressure 138/77, pulse 61, temperature 97.8 F (36.6 C), temperature source Oral, resp. rate 17, height 5' 4"  (1.626 m), weight 54 kg, SpO2 97 %.  The patient is calm and cooperative at this time.  There have been no acute events since the last update.  Awaiting disposition plan from Social Work team.   Paulette Blanch, MD 11/02/20 [phone]

## 2020-11-02 NOTE — TOC Transition Note (Signed)
Transition of Care [facility]) - CM/SW Discharge Note   Patient Details  Name: Douglas Peterson. MRN: [id number] Date of Birth: 1951/04/01  Transition of Care [facility]) CM/SW Contact:  [name] Phone Number:  [phone] 11/02/2020, 4:08 PM   Clinical Narrative:     Patient will d/c to [name] [phone], tomorrow Saturday 11/03/2020, pick up at 11:00AM.  Aida Raider (Legal Guardian) [phone] will meet patient at the hospital.  [facility] consult completed.      Barriers to Discharge: ED Unsafe disposition,Other (comment),Unsafe home situation (pending guardianship paperwork and executor having access to funds, ALF pending bed offer)   Patient Goals and CMS Choice   CMS Medicare.gov Compare Post Acute Care list provided to:: Patient Choice offered to / list presented to : Patient  Discharge Placement                       Discharge Plan and Services   Discharge Planning Services: CM Consult                                 Social Determinants of Health (SDOH) Interventions     Readmission Risk Interventions Readmission Risk Prevention Plan 08/23/2020 08/19/2020 08/14/2020  Transportation Screening Complete Complete Complete  PCP or Specialist Appt within 3-5 Days - - -  HRI or [place] - - -  Palliative Care Screening - - -  Medication Review ([place]) Complete Complete -  PCP or Specialist appointment within 3-5 days of discharge (No Data) Complete Complete  HRI or Home Care Consult Complete - Complete  SW Recovery Care/Counseling Consult - Complete Complete  Palliative Care Screening - Not Applicable Not [place] Not Applicable - Patient Refused  Some recent data might be hidden

## 2020-11-03 MED ORDER — ATENOLOL 25 MG PO TABS
25.0000 mg | ORAL_TABLET | Freq: Two times a day (BID) | ORAL | 0 refills | Status: DC
Start: 2020-11-03 — End: 2021-09-20

## 2020-11-03 MED ORDER — ACETAMINOPHEN 500 MG PO TABS
500.0000 mg | ORAL_TABLET | Freq: Three times a day (TID) | ORAL | 0 refills | Status: DC | PRN
Start: 2020-11-03 — End: 2021-09-20

## 2020-11-03 MED ORDER — PAROXETINE HCL 30 MG PO TABS
30.0000 mg | ORAL_TABLET | Freq: Every day | ORAL | 0 refills | Status: DC
Start: 2020-11-03 — End: 2021-09-20

## 2020-11-03 MED ORDER — VITAMIN C 1000 MG PO TABS
1000.0000 mg | ORAL_TABLET | Freq: Every day | ORAL | 0 refills | Status: DC
Start: 2020-11-03 — End: 2021-09-20

## 2020-11-03 MED ORDER — ASPIRIN EC 81 MG PO TBEC
81.0000 mg | DELAYED_RELEASE_TABLET | Freq: Every day | ORAL | 0 refills | Status: DC
Start: 2020-11-03 — End: 2021-09-20

## 2020-11-03 MED ORDER — THIAMINE HCL 100 MG PO TABS
100.0000 mg | ORAL_TABLET | Freq: Every day | ORAL | 0 refills | Status: DC
Start: 2020-11-03 — End: 2021-09-20

## 2020-11-03 NOTE — ED Notes (Signed)
Received report from Amy, RN at this time. PT is sitting in recliner in room, no complaints or needs at this time. PT is comfortable at this time

## 2020-11-03 NOTE — ED Notes (Signed)
Pt left facility in the custody of DSS worker

## 2020-11-03 NOTE — ED Notes (Signed)
Pt ambulates on own to restroom, returns to bed. Provided with cup of coffee at this time.

## 2020-11-03 NOTE — TOC Progression Note (Signed)
Transition of Care (TOC) - Progression Note    Patient Details  Name: Luisfelipe Engelstad. MRN: [id number] Date of Birth: 06-12-1951  Transition of Care [facility]) CM/SW [place], LCSW Phone Number: 11/03/2020, 8:23 AM  Clinical Narrative:   Patient will d/c on 11/03/2020, to [place], pick up at 11:00AM.   Patient will d/c to [facility] [phone].  Aida Raider (Legal Guardian) [phone] will meet patient at the hospital.    [facility] consult completed.   Expected Discharge Plan: Skilled Nursing Facility Barriers to Discharge: ED Unsafe disposition,Other (comment),Unsafe home situation (pending guardianship paperwork and executor having access to funds, ALF pending bed offer)  Expected Discharge Plan and Services Expected Discharge Plan: [place]   Discharge Planning Services: CM Consult   Living arrangements for the past 2 months: Single Family Home                                       Social Determinants of Health (SDOH) Interventions    Readmission Risk Interventions Readmission Risk Prevention Plan 08/23/2020 08/19/2020 08/14/2020  Transportation Screening Complete Complete Complete  PCP or Specialist Appt within 3-5 Days - - -  HRI or [place] - - -  Palliative Care Screening - - -  Medication Review ([place]) Complete Complete -  PCP or Specialist appointment within 3-5 days of discharge (No Data) Complete Complete  HRI or Home Care Consult Complete - Complete  SW Recovery Care/Counseling Consult - Complete Complete  Palliative Care Screening - Not Applicable Not [place] Not Applicable - Patient Refused  Some recent data might be hidden

## 2020-11-03 NOTE — NC FL2 (Signed)
[place] LEVEL OF CARE SCREENING TOOL     IDENTIFICATION  Patient Name: Douglas Peterson. Birthdate: 1951/10/15 Sex: male Admission Date (Current Location): 09/19/2020  [place] and [place] Number:  Engineering geologist and Address:  [facility], [address] [place], [place] [address]      Provider Number: [id number]  Attending Physician Name and Address:  No att. providers found  Relative Name and Phone Number:  Aida Raider ([place])   [phone]    Current Level of Care: Hospital Recommended Level of Care: Other (Comment) (Group Home) Prior Approval Number:    Date Approved/Denied:   PASRR Number: [id number]  Discharge Plan: Other (Comment) ([place])    Current Diagnoses: Patient Active Problem List   Diagnosis Date Noted  . SIRS (systemic inflammatory response syndrome) ([place]) 08/05/2020  . GERD (gastroesophageal reflux disease) 06/05/2020  . Alcohol use disorder, severe, dependence ([place]) 05/28/2020  . Paroxysmal A-fib ([place]) 03/17/2020  . Severe protein-calorie malnutrition ([place]) 02/25/2020  . Left inguinal hernia 01/31/2020  . Encounter for competency evaluation   . Pancytopenia ([place])   . Alcoholic cirrhosis of liver without ascites ([place])   . Thrombocytopenia ([place])   . 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 Alcohol withdrawal delirium, acute, hyperactive ([place]) 09/21/2019  . Pressure injury of ankle, stage 1 08/15/2019  . Palliative care by specialist   . Alcohol withdrawal ([place]) 03/20/2019  . Delirium tremens ([place]) 03/08/2018  . HTN (hypertension) 09/16/2017    Orientation RESPIRATION BLADDER Height & Weight     Self,Time,Situation,Place  Normal Continent Weight: 119 lb 0.8 oz (54 kg) Height:  5' 4"  (162.6 cm)  BEHAVIORAL SYMPTOMS/MOOD NEUROLOGICAL BOWEL NUTRITION STATUS      Continent Diet  AMBULATORY STATUS COMMUNICATION OF NEEDS Skin    Supervision Verbally Normal                       Personal Care Assistance Level of Assistance  Bathing,Feeding,Dressing Bathing Assistance: Independent Feeding assistance: Independent Dressing Assistance: Independent Total Care Assistance: Independent   Functional Limitations Info  Sight,Hearing,Speech Sight Info: Adequate Hearing Info: Adequate Speech Info: Adequate    SPECIAL CARE FACTORS FREQUENCY  PT (By licensed PT),OT (By licensed OT)     PT Frequency: 2X OT Frequency: 5x a week            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 acetaminophen (TYLENOL) tablet 1,000 mg  1,000 mg Oral Q6H PRN Hinda Kehr, MD   1,000 mg at 10/12/20 1556  . atenolol (TENORMIN) tablet 25 mg  25 mg Oral BID Lucrezia Starch, MD   25 mg at 11/02/20 2133  . baclofen (LIORESAL) tablet 10 mg  10 mg Oral TID Lucrezia Starch, MD   10 mg at 11/02/20 2133  . PARoxetine (PAXIL) tablet 30 mg  30 mg Oral Daily Lucrezia Starch, MD   30 mg at 11/02/20 0944  . polyethylene glycol (MIRALAX / GLYCOLAX) packet 17 g  17 g Oral Daily Blake Divine, MD   17 g at 11/02/20 0951  . thiamine tablet 100 mg  100 mg Oral  Daily Lucrezia Starch, MD   100 mg at 11/02/20 3267   Or  . thiamine (B-1) injection 100 mg  100 mg Intravenous Daily Lucrezia Starch, MD       Current Outpatient Medications  Medication Sig Dispense Refill  . Ascorbic Acid (VITAMIN C) 1000 MG tablet Take 1,000 mg by mouth daily.    [name] aspirin EC 81 MG tablet Take 81 mg by mouth daily. Swallow whole.    [name] atenolol (TENORMIN) 25 MG tablet Take 25 mg by mouth 2 (two) times daily.     . baclofen  (LIORESAL) 10 MG tablet Take 10 mg by mouth 3 (three) times daily.    . calcium-vitamin D (OSCAL WITH D) 500-200 MG-UNIT tablet Take 1 tablet by mouth daily with breakfast.    . Cholecalciferol 125 MCG (5000 UT) TABS Take 5,000 Units by mouth daily.    [name] doxepin (SINEQUAN) 10 MG capsule Take 10 mg by mouth at bedtime as needed (sleep).     . feeding supplement, ENSURE ENLIVE, (ENSURE ENLIVE) LIQD Take 237 mLs by mouth 2 (two) times daily between meals. 124 mL 12  . folic acid (FOLVITE) 1 MG tablet Take 1 tablet (1 mg total) by mouth daily. 30 tablet 0  . hydrOXYzine (ATARAX/VISTARIL) 25 MG tablet Take 25 mg by mouth 3 (three) times daily as needed for anxiety or itching.     . Multiple Vitamin (MULTIVITAMIN WITH MINERALS) TABS tablet Take 1 tablet by mouth daily. 30 tablet 0  . PARoxetine (PAXIL) 30 MG tablet Take 30 mg by mouth daily.    [name] senna-docusate (SENOKOT-S) 8.6-50 MG tablet Take 2 tablets by mouth at bedtime. 30 tablet 1  . thiamine 100 MG tablet Take 1 tablet (100 mg total) by mouth daily. 30 tablet 0  . acetaminophen (TYLENOL) 500 MG tablet Take 1 tablet (500 mg total) by mouth every 8 (eight) hours as needed for mild pain or fever. 15 tablet 0     Discharge Medications: Please see discharge summary for a list of discharge medications.  Relevant Imaging Results:  Relevant Lab Results:   Additional Information SS# [id number]                              **[name] private pay**  Amelia, LCSW

## 2021-08-13 ENCOUNTER — Ambulatory Visit (INDEPENDENT_AMBULATORY_CARE_PROVIDER_SITE_OTHER): Payer: Medicare HMO | Admitting: Gastroenterology

## 2021-08-13 ENCOUNTER — Other Ambulatory Visit: Payer: Self-pay

## 2021-08-13 ENCOUNTER — Encounter: Payer: Self-pay | Admitting: Gastroenterology

## 2021-08-13 VITALS — BP 145/83 | HR 69 | Ht 64.0 in | Wt 120.8 lb

## 2021-08-13 DIAGNOSIS — H9191 Unspecified hearing loss, right ear: Secondary | ICD-10-CM | POA: Insufficient documentation

## 2021-08-13 DIAGNOSIS — K7030 Alcoholic cirrhosis of liver without ascites: Secondary | ICD-10-CM | POA: Diagnosis not present

## 2021-08-13 DIAGNOSIS — R202 Paresthesia of skin: Secondary | ICD-10-CM | POA: Insufficient documentation

## 2021-08-13 DIAGNOSIS — R490 Dysphonia: Secondary | ICD-10-CM | POA: Insufficient documentation

## 2021-08-13 MED ORDER — CLENPIQ 10-3.5-12 MG-GM -GM/160ML PO SOLN: 320.0000 mL | ORAL | 0 refills | Status: DC
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
## 2021-08-14 ENCOUNTER — Other Ambulatory Visit: Payer: Self-pay

## 2021-08-14 DIAGNOSIS — K7030 Alcoholic cirrhosis of liver without ascites: Secondary | ICD-10-CM

## 2021-08-14 DIAGNOSIS — Z1211 Encounter for screening for malignant neoplasm of colon: Secondary | ICD-10-CM

## 2021-08-15 LAB — COMPREHENSIVE METABOLIC PANEL
ALT: 25 IU/L (ref 0–44)
AST: 27 IU/L (ref 0–40)
Albumin/Globulin Ratio: 2.2 (ref 1.2–2.2)
Albumin: 4.6 g/dL (ref 3.8–4.8)
Alkaline Phosphatase: 78 IU/L (ref 44–121)
BUN/Creatinine Ratio: 17 (ref 10–24)
BUN: 14 mg/dL (ref 8–27)
Bilirubin Total: 0.4 mg/dL (ref 0.0–1.2)
CO2: 21 mmol/L (ref 20–29)
Calcium: 10.1 mg/dL (ref 8.6–10.2)
Chloride: 100 mmol/L (ref 96–106)
Creatinine, Ser: 0.81 mg/dL (ref 0.76–1.27)
Globulin, Total: 2.1 g/dL (ref 1.5–4.5)
Glucose: 98 mg/dL (ref 70–99)
Potassium: 4.1 mmol/L (ref 3.5–5.2)
Sodium: 138 mmol/L (ref 134–144)
Total Protein: 6.7 g/dL (ref 6.0–8.5)
eGFR: 95 mL/min/{1.73_m2} (ref >59)

## 2021-08-15 LAB — PROTIME-INR
INR: 2.3 — ABNORMAL HIGH (ref 0.9–1.2)
Prothrombin Time: 23 s — ABNORMAL HIGH (ref 9.1–12.0)

## 2021-08-15 LAB — CBC WITH DIFFERENTIAL/PLATELET
Basophils Absolute: 0.1 10*3/uL (ref 0.0–0.2)
Basos: 1 %
EOS (ABSOLUTE): 0.2 10*3/uL (ref 0.0–0.4)
Eos: 2 %
Hematocrit: 41.2 % (ref 37.5–51.0)
Hemoglobin: 14.5 g/dL (ref 13.0–17.7)
Immature Grans (Abs): 0 10*3/uL (ref 0.0–0.1)
Immature Granulocytes: 0 %
Lymphocytes Absolute: 4 10*3/uL — ABNORMAL HIGH (ref 0.7–3.1)
Lymphs: 48 %
MCH: 31.7 pg (ref 26.6–33.0)
MCHC: 35.2 g/dL (ref 31.5–35.7)
MCV: 90 fL (ref 79–97)
Monocytes Absolute: 0.4 10*3/uL (ref 0.1–0.9)
Monocytes: 5 %
Neutrophils Absolute: 3.6 10*3/uL (ref 1.4–7.0)
Neutrophils: 44 %
Platelets: 176 10*3/uL (ref 150–450)
RBC: 4.57 x10E6/uL (ref 4.14–5.80)
RDW: 11.8 % (ref 11.6–15.4)
WBC: 8.2 10*3/uL (ref 3.4–10.8)

## 2021-08-15 LAB — HCV FIBROSURE
ALPHA 2-MACROGLOBULINS, QN: 351 mg/dL — ABNORMAL HIGH (ref 110–276)
ALT (SGPT) P5P: 29 IU/L (ref 0–55)
Apolipoprotein A-1: 122 mg/dL (ref 101–178)
Bilirubin, Total: 0.3 mg/dL (ref 0.0–1.2)
Fibrosis Score: 0.68 — ABNORMAL HIGH (ref 0.00–0.21)
GGT: 25 IU/L (ref 0–65)
Haptoglobin: 51 mg/dL (ref 32–363)
Necroinflammat Activity Score: 0.23 — ABNORMAL HIGH (ref 0.00–0.17)

## 2021-08-15 LAB — IRON AND TIBC
Iron Saturation: 49 % (ref 15–55)
Iron: 122 ug/dL (ref 38–169)
Total Iron Binding Capacity: 250 ug/dL (ref 250–450)
UIBC: 128 ug/dL (ref 111–343)

## 2021-08-15 LAB — FERRITIN: Ferritin: 164 ng/mL (ref 30–400)

## 2021-08-20 ENCOUNTER — Encounter: Payer: Self-pay | Admitting: General Surgery

## 2021-08-29 ENCOUNTER — Telehealth: Payer: Self-pay

## 2021-08-29 NOTE — Telephone Encounter (Signed)
Pt scheduled for a follow up appt on 09/10/21. Appt reminder has been mailed.

## 2021-08-29 NOTE — Telephone Encounter (Signed)
-----   Message from Lucilla Lame, MD sent at 08/19/2021  6:48 PM EDT ----- Please have the patient come in for a follow up.

## 2021-09-04 ENCOUNTER — Ambulatory Visit: Payer: Medicare HMO | Attending: Gastroenterology

## 2021-09-10 ENCOUNTER — Ambulatory Visit: Payer: Medicare HMO | Admitting: Gastroenterology

## 2021-09-12 ENCOUNTER — Encounter: Payer: Self-pay | Admitting: Gastroenterology

## 2021-09-19 ENCOUNTER — Encounter: Admission: RE | Payer: Self-pay | Source: Home / Self Care

## 2021-09-19 ENCOUNTER — Ambulatory Visit: Admission: RE | Admit: 2021-09-19 | Payer: Medicare HMO | Source: Home / Self Care | Admitting: Gastroenterology

## 2021-09-19 ENCOUNTER — Emergency Department
Admission: EM | Admit: 2021-09-19 | Discharge: 2021-09-20 | Disposition: A | Discharge status: Skilled Nursing Facility | Payer: Medicare HMO | Attending: Emergency Medicine | Admitting: Emergency Medicine

## 2021-09-19 ENCOUNTER — Other Ambulatory Visit: Payer: Self-pay

## 2021-09-19 DIAGNOSIS — Z20822 Contact with and (suspected) exposure to covid-19: Secondary | ICD-10-CM | POA: Insufficient documentation

## 2021-09-19 DIAGNOSIS — R2681 Unsteadiness on feet: Secondary | ICD-10-CM | POA: Insufficient documentation

## 2021-09-19 DIAGNOSIS — Y906 Blood alcohol level of 120-199 mg/100 ml: Secondary | ICD-10-CM | POA: Diagnosis not present

## 2021-09-19 DIAGNOSIS — I10 Essential (primary) hypertension: Secondary | ICD-10-CM | POA: Diagnosis not present

## 2021-09-19 DIAGNOSIS — Z79899 Other long term (current) drug therapy: Secondary | ICD-10-CM | POA: Insufficient documentation

## 2021-09-19 DIAGNOSIS — R29898 Other symptoms and signs involving the musculoskeletal system: Secondary | ICD-10-CM | POA: Diagnosis not present

## 2021-09-19 DIAGNOSIS — I4891 Unspecified atrial fibrillation: Secondary | ICD-10-CM | POA: Insufficient documentation

## 2021-09-19 DIAGNOSIS — Z7982 Long term (current) use of aspirin: Secondary | ICD-10-CM | POA: Diagnosis not present

## 2021-09-19 DIAGNOSIS — R531 Weakness: Secondary | ICD-10-CM | POA: Diagnosis present

## 2021-09-19 DIAGNOSIS — R260 Ataxic gait: Secondary | ICD-10-CM | POA: Insufficient documentation

## 2021-09-19 DIAGNOSIS — F1090 Alcohol use, unspecified, uncomplicated: Secondary | ICD-10-CM | POA: Diagnosis not present

## 2021-09-19 DIAGNOSIS — M6281 Muscle weakness (generalized): Secondary | ICD-10-CM | POA: Diagnosis not present

## 2021-09-19 LAB — URINALYSIS, COMPLETE (UACMP) WITH MICROSCOPIC
Bilirubin Urine: NEGATIVE
Glucose, UA: NEGATIVE mg/dL
Hgb urine dipstick: NEGATIVE
Ketones, ur: NEGATIVE mg/dL
Leukocytes,Ua: NEGATIVE
Nitrite: NEGATIVE
Protein, ur: NEGATIVE mg/dL
Specific Gravity, Urine: 1.008 (ref 1.005–1.030)
pH: 5 (ref 5.0–8.0)

## 2021-09-19 LAB — CBC
HCT: 46.1 % (ref 39.0–52.0)
Hemoglobin: 15.7 g/dL (ref 13.0–17.0)
MCH: 32.1 pg (ref 26.0–34.0)
MCHC: 34.1 g/dL (ref 30.0–36.0)
MCV: 94.3 fL (ref 80.0–100.0)
Platelets: 123 10*3/uL — ABNORMAL LOW (ref 150–400)
RBC: 4.89 MIL/uL (ref 4.22–5.81)
RDW: 13.6 % (ref 11.5–15.5)
WBC: 5.4 10*3/uL (ref 4.0–10.5)
nRBC: 0 % (ref 0.0–0.2)

## 2021-09-19 LAB — COMPREHENSIVE METABOLIC PANEL
ALT: 46 U/L — ABNORMAL HIGH (ref 0–44)
AST: 66 U/L — ABNORMAL HIGH (ref 15–41)
Albumin: 4.3 g/dL (ref 3.5–5.0)
Alkaline Phosphatase: 101 U/L (ref 38–126)
Anion gap: 9 (ref 5–15)
BUN: 7 mg/dL — ABNORMAL LOW (ref 8–23)
CO2: 25 mmol/L (ref 22–32)
Calcium: 8.6 mg/dL — ABNORMAL LOW (ref 8.9–10.3)
Chloride: 101 mmol/L (ref 98–111)
Creatinine, Ser: 0.51 mg/dL — ABNORMAL LOW (ref 0.61–1.24)
GFR, Estimated: >60 mL/min (ref >60)
Glucose, Bld: 134 mg/dL — ABNORMAL HIGH (ref 70–99)
Potassium: 3.5 mmol/L (ref 3.5–5.1)
Sodium: 135 mmol/L (ref 135–145)
Total Bilirubin: 1 mg/dL (ref 0.3–1.2)
Total Protein: 8 g/dL (ref 6.5–8.1)

## 2021-09-19 LAB — ETHANOL: Alcohol, Ethyl (B): 161 mg/dL — ABNORMAL HIGH (ref <10)

## 2021-09-19 SURGERY — COLONOSCOPY WITH PROPOFOL
Anesthesia: General

## 2021-09-19 MED ORDER — ATENOLOL 25 MG PO TABS
25.0000 mg | ORAL_TABLET | Freq: Two times a day (BID) | ORAL | Status: DC
  Administered 2021-09-19 – 2021-09-20 (×2): 25 mg via ORAL
  Filled 2021-09-19 (×2): qty 1

## 2021-09-19 MED ORDER — LORAZEPAM 2 MG/ML IJ SOLN: 0.0000 mg | Freq: Four times a day (QID) | INTRAMUSCULAR | Status: DC

## 2021-09-19 MED ORDER — LORAZEPAM 2 MG PO TABS: 0.0000 mg | ORAL_TABLET | Freq: Two times a day (BID) | ORAL | Status: DC

## 2021-09-19 MED ORDER — THIAMINE HCL 100 MG/ML IJ SOLN: 100.0000 mg | Freq: Every day | INTRAMUSCULAR | Status: DC

## 2021-09-19 MED ORDER — FOLIC ACID 1 MG PO TABS
1.0000 mg | ORAL_TABLET | Freq: Every day | ORAL | Status: DC
  Administered 2021-09-20: 1 mg via ORAL
  Filled 2021-09-19: qty 1

## 2021-09-19 MED ORDER — LORAZEPAM 1 MG PO TABS
1.0000 mg | ORAL_TABLET | Freq: Once | ORAL | Status: AC
  Administered 2021-09-19: 20:00:00 1 mg via ORAL
  Filled 2021-09-19: qty 1

## 2021-09-19 MED ORDER — ATORVASTATIN CALCIUM 20 MG PO TABS
10.0000 mg | ORAL_TABLET | Freq: Every day | ORAL | Status: DC
  Administered 2021-09-20: 10 mg via ORAL
  Filled 2021-09-19: qty 1

## 2021-09-19 MED ORDER — LORAZEPAM 1 MG PO TABS
1.0000 mg | ORAL_TABLET | Freq: Once | ORAL | Status: AC
  Administered 2021-09-19: 23:00:00 1 mg via ORAL
  Filled 2021-09-19: qty 1

## 2021-09-19 MED ORDER — LORAZEPAM 2 MG PO TABS: 0.0000 mg | ORAL_TABLET | Freq: Four times a day (QID) | ORAL | Status: DC

## 2021-09-19 MED ORDER — THIAMINE HCL 100 MG PO TABS
100.0000 mg | ORAL_TABLET | Freq: Every day | ORAL | Status: DC
  Administered 2021-09-20: 100 mg via ORAL
  Filled 2021-09-19: qty 1

## 2021-09-19 MED ORDER — PAROXETINE HCL 30 MG PO TABS
30.0000 mg | ORAL_TABLET | Freq: Every day | ORAL | Status: DC
  Administered 2021-09-20: 30 mg via ORAL
  Filled 2021-09-19: qty 1

## 2021-09-19 MED ORDER — LORAZEPAM 2 MG/ML IJ SOLN: 0.0000 mg | Freq: Two times a day (BID) | INTRAMUSCULAR | Status: DC

## 2021-09-19 MED ORDER — THIAMINE HCL 100 MG PO TABS: 100.0000 mg | ORAL_TABLET | Freq: Every day | ORAL | Status: DC
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
## 2021-09-19 NOTE — ED Triage Notes (Signed)
Pt in with co weakness, pt was sent over by DSS after they were called. Pt unsure they called. Pt states sister has been worried about him due to drinking and ":failure to thrive". Pt states he has had nausea and some abd pain.
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
## 2021-09-20 ENCOUNTER — Emergency Department: Payer: Medicare HMO

## 2021-09-20 LAB — RESP PANEL BY RT-PCR (FLU A&B, COVID) ARPGX2
Influenza A by PCR: NEGATIVE
Influenza B by PCR: NEGATIVE
SARS Coronavirus 2 by RT PCR: NEGATIVE

## 2021-09-20 MED ORDER — ATORVASTATIN CALCIUM 10 MG PO TABS: 10.0000 mg | ORAL_TABLET | Freq: Every day | ORAL | 0 refills | Status: DC

## 2021-09-20 MED ORDER — BACLOFEN 10 MG PO TABS
10.0000 mg | ORAL_TABLET | Freq: Three times a day (TID) | ORAL | Status: DC
  Administered 2021-09-20: 10 mg via ORAL
  Filled 2021-09-20 (×3): qty 1

## 2021-09-20 MED ORDER — ENSURE ENLIVE PO LIQD: 237.0000 mL | Freq: Two times a day (BID) | ORAL | 12 refills | Status: AC

## 2021-09-20 MED ORDER — SENNOSIDES-DOCUSATE SODIUM 8.6-50 MG PO TABS: 2.0000 | ORAL_TABLET | Freq: Every day | ORAL | 1 refills | Status: DC

## 2021-09-20 MED ORDER — ADULT MULTIVITAMIN W/MINERALS CH: 1.0000 | ORAL_TABLET | Freq: Every day | ORAL | 0 refills | Status: DC

## 2021-09-20 MED ORDER — DOXEPIN HCL 10 MG PO CAPS: 10.0000 mg | ORAL_CAPSULE | Freq: Every evening | ORAL | 0 refills | Status: DC | PRN

## 2021-09-20 MED ORDER — VITAMIN C 1000 MG PO TABS: 1000.0000 mg | ORAL_TABLET | Freq: Every day | ORAL | 0 refills | Status: DC

## 2021-09-20 MED ORDER — PAROXETINE HCL 30 MG PO TABS: 30.0000 mg | ORAL_TABLET | Freq: Every day | ORAL | 0 refills | Status: DC

## 2021-09-20 MED ORDER — FOLIC ACID 1 MG PO TABS: 1.0000 mg | ORAL_TABLET | Freq: Every day | ORAL | 0 refills | Status: AC

## 2021-09-20 MED ORDER — CLENPIQ 10-3.5-12 MG-GM -GM/160ML PO SOLN: 320.0000 mL | ORAL | 0 refills | Status: DC

## 2021-09-20 MED ORDER — OYSTER SHELL CALCIUM W/D 500-200 MG-UNIT PO TABS: 1.0000 | ORAL_TABLET | Freq: Every day | ORAL | 0 refills | Status: DC | PRN

## 2021-09-20 MED ORDER — ASPIRIN EC 81 MG PO TBEC: 81.0000 mg | DELAYED_RELEASE_TABLET | Freq: Every day | ORAL | 0 refills | Status: DC

## 2021-09-20 MED ORDER — CHOLECALCIFEROL 125 MCG (5000 UT) PO TABS: 5000.0000 [IU] | ORAL_TABLET | Freq: Every day | ORAL | 0 refills | Status: DC

## 2021-09-20 MED ORDER — HYDROXYZINE HCL 25 MG PO TABS: 25.0000 mg | ORAL_TABLET | Freq: Three times a day (TID) | ORAL | 0 refills | Status: DC | PRN

## 2021-09-20 MED ORDER — POLYETHYLENE GLYCOL 3350 17 GM/SCOOP PO POWD: 17.0000 g | Freq: Every day | ORAL | 0 refills | Status: DC

## 2021-09-20 MED ORDER — BACLOFEN 10 MG PO TABS: 10.0000 mg | ORAL_TABLET | Freq: Three times a day (TID) | ORAL | 0 refills | Status: DC

## 2021-09-20 MED ORDER — ACETAMINOPHEN 500 MG PO TABS
500.0000 mg | ORAL_TABLET | Freq: Three times a day (TID) | ORAL | 0 refills | Status: DC | PRN
Start: 2021-09-20 — End: 2022-04-26

## 2021-09-20 MED ORDER — ATENOLOL 25 MG PO TABS: 25.0000 mg | ORAL_TABLET | Freq: Two times a day (BID) | ORAL | 0 refills | Status: DC

## 2021-09-20 MED ORDER — THIAMINE HCL 100 MG PO TABS
100.0000 mg | ORAL_TABLET | Freq: Every day | ORAL | 0 refills | Status: AC
Start: 2021-09-20 — End: ?

## 2021-09-20 NOTE — ED Notes (Signed)
Pt assisted to recliner in hallway. Pt given urinal and drink

## 2021-09-20 NOTE — ED Notes (Signed)
Spoke w/  Paulo Fruit of [employer]  adult services.. following up on this pt .  Pending case mgt this am

## 2021-09-20 NOTE — TOC Progression Note (Signed)
Transition of Care (TOC) - Progression Note    Patient Details  Name: Douglas Peterson. MRN: [id number] Date of Birth: 1951/10/14  Transition of Care [facility]) CM/SW [place], [place] Phone Number: [phone] 09/20/2021, 12:23 PM  Clinical Narrative:     Discharge plan is for placement at [facility].  CSW discussed discharge plan with patient, as did his guardian Toula Moos [facility] DSS/APS [phone]. Patient agreeable. Confirmation of [facility] placement was given by Drue Second.  Placement is pending patient's furniture moved to [facility], if patient is unable to admit today, he will have to wait until Monday 09/23/2021. TB x-ray test requested.  PT recommendation for SNF. Patient opted for home health.  CSW inquired about home health availability at [facility]. Confirmed in house home health is available. Home Health PT/OT orders requested. EDP/EDRN updated on discharge plan.     Barriers to Discharge: No Barriers Identified  Expected Discharge Plan and Services                                                 Social Determinants of Health (SDOH) Interventions    Readmission Risk Interventions Readmission Risk Prevention Plan 08/23/2020 08/19/2020 08/14/2020  Transportation Screening Complete Complete Complete  PCP or Specialist Appt within 3-5 Days - - -  HRI or [place] - - -  Palliative Care Screening - - -  Medication Review (RN Care Manager) Complete Complete -  PCP or Specialist appointment within 3-5 days of discharge (No Data) Complete Complete  HRI or Home Care Consult Complete - Complete  SW Recovery Care/Counseling Consult - Complete Complete  Palliative Care Screening - Not Applicable Not [place] Not Applicable - Patient Refused  Some recent data might be hidden

## 2021-09-20 NOTE — TOC Initial Note (Signed)
Transition of Care (TOC) - Initial/Assessment Note    Patient Details  Name: Douglas Peterson. MRN: [id number] Date of Birth: 1951/06/14  Transition of Care [facility]) CM/SW Contact:    [name] Phone Number: [phone] 09/20/2021, 9:02 AM  Clinical Narrative:                   Patient presents to [facility] due to fall, weakness and failure to thrive.  Patient has alcohol use hx.  [name] currently living independently at home.  Patient's main contact is Toula Moos Theme park manager Guardian)  [place] DSS/APS. CSW spoke with Ms. Orland Mustard who is considering ALF/Group Home placement for the patient.  FL2 sent to [email or web address]   Barriers to Discharge: No Barriers Identified   Patient Goals and CMS Choice        Expected Discharge Plan and Services                                                Prior Living Arrangements/Services                       Activities of Daily Living      Permission Sought/Granted                  Emotional Assessment              Admission diagnosis:  Failure to thrive Patient Active Problem List   Diagnosis Date Noted   Dysphonia 08/13/2021   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   Paresthesia of right upper extremity 08/13/2021   SIRS (systemic inflammatory response syndrome) ([place]) 08/05/2020   GERD (gastroesophageal reflux disease) 06/05/2020   Alcohol use disorder, severe, dependence ([place]) 05/28/2020   Paroxysmal A-fib ([place]) 03/17/2020   Severe protein-calorie malnutrition ([place]) 02/25/2020   Primary insomnia 02/06/2020   Left inguinal hernia 01/31/2020   Encounter for competency evaluation    Pancytopenia ([place])    Alcoholic cirrhosis of liver without ascites ([place])    Thrombocytopenia ([place])    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[place]) 09/27/2019   Alcohol withdrawal delirium, acute, hyperactive ([place]) 09/21/2019   Pressure injury of ankle, stage  1 08/15/2019   Palliative care by specialist    Alcohol withdrawal ([place]) 03/20/2019   Closed fracture of right proximal humerus 03/19/2019   Vitamin D deficiency 01/11/2019   Osteoporosis 12/22/2018   Closed nondisplaced fracture of acromial process of right scapula with routine healing 11/15/2018   Compression fracture of L2 vertebra with routine healing 11/15/2018   Delirium tremens ([place]) 03/08/2018   HTN (hypertension) 09/16/2017   Encephalopathy, portal systemic 02/27/2017   Steatohepatitis 12/03/2016   Abnormal liver enzymes 06/17/2016   Hereditary hemochromatosis ([place]) 06/17/2016   Anxiety 07/16/2015   Benign essential hypertension 05/22/2014   H/O traumatic brain injury 05/22/2014   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[place]) 05/22/2014   PCP:  [name], MD (Inactive) Pharmacy:   [facility] [address] [place] [address] [place] [address] Phone: [phone] Fax: [phone]     Social Determinants of Health (SDOH) Interventions    Readmission Risk Interventions Readmission Risk Prevention Plan 08/23/2020 08/19/2020 08/14/2020  Transportation Screening Complete Complete Complete  PCP or Specialist Appt within 3-5 Days - - -  HRI or [place] - - -  Palliative Care Screening - - -  Medication Review (RN Care Manager) Complete Complete -  PCP or Specialist appointment within 3-5 days of discharge (No Data) Complete Complete  HRI or Home Care Consult Complete - Complete  SW Recovery Care/Counseling Consult - Complete Complete  Palliative Care Screening - Not Applicable Not [place] Not Applicable - Patient Refused  Some recent data might be hidden

## 2021-09-20 NOTE — TOC Transition Note (Signed)
Transition of Care (TOC) - Progression Note    Patient Details  Name: Douglas Peterson. MRN: [id number] Date of Birth: December 15, 1950  Transition of Care [facility]) CM/SW [place], [place] Phone Number: [phone]  09/20/2021, 3:03 PM  Clinical Narrative:     [facility].  Toula Moos guardian w/ [facility] [phone] will pick him up for transport around 3:45PM.  EDP/ED Staff updated.    Barriers to Discharge: No Barriers Identified  Expected Discharge Plan and Services                                                 Social Determinants of Health (SDOH) Interventions    Readmission Risk Interventions Readmission Risk Prevention Plan 08/23/2020 08/19/2020 08/14/2020  Transportation Screening Complete Complete Complete  PCP or Specialist Appt within 3-5 Days - - -  HRI or [place] - - -  Palliative Care Screening - - -  Medication Review (RN Care Manager) Complete Complete -  PCP or Specialist appointment within 3-5 days of discharge (No Data) Complete Complete  HRI or Home Care Consult Complete - Complete  SW Recovery Care/Counseling Consult - Complete Complete  Palliative Care Screening - Not Applicable Not [place] Not Applicable - Patient Refused  Some recent data might be hidden

## 2021-09-20 NOTE — NC FL2 (Signed)
[place] LEVEL OF CARE SCREENING TOOL     IDENTIFICATION  Patient Name: Douglas Peterson. Birthdate: Mar 26, 1951 Sex: male Admission Date (Current Location): 09/19/2021  [facility] and [place] Number:  Engineering geologist and Address:  [facility], [address], [place], [place] [address]      Provider Number: [id number]  Attending Physician Name and Address:  No att. providers found  Relative Name and Phone Number:  Toula Moos (Legal Guardian)   [phone] [facility] DSS/APS    Current Level of Care: Hospital Recommended Level of Care: [place], [facility], Other (Comment) (Group Home) Prior Approval Number:    Date Approved/Denied:   PASRR Number:    Discharge Plan: Other (Comment) (ALF/Family Care Home/Group Home)    Current Diagnoses: Patient Active Problem List   Diagnosis Date Noted   Dysphonia 08/13/2021   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   Paresthesia of right upper extremity 08/13/2021   SIRS (systemic inflammatory response syndrome) ([place]) 08/05/2020   GERD (gastroesophageal reflux disease) 06/05/2020   Alcohol use disorder, severe, dependence ([place]) 05/28/2020   Paroxysmal A-fib ([place]) 03/17/2020   Severe protein-calorie malnutrition ([place]) 02/25/2020   Primary insomnia 02/06/2020   Left inguinal hernia 01/31/2020   Encounter for competency evaluation    Pancytopenia ([place])    Alcoholic cirrhosis of liver without ascites (HCC)    Thrombocytopenia (HCC)    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[place]) 09/27/2019   Alcohol withdrawal delirium, acute, hyperactive ([place]) 09/21/2019   Pressure injury of ankle, stage 1 08/15/2019   Palliative care by specialist    Alcohol withdrawal ([place]) 03/20/2019   Closed fracture of right proximal humerus 03/19/2019   Vitamin D deficiency 01/11/2019   Osteoporosis 12/22/2018   Closed nondisplaced fracture of acromial process of right  scapula with routine healing 11/15/2018   Compression fracture of L2 vertebra with routine healing 11/15/2018   Delirium tremens ([place]) 03/08/2018   HTN (hypertension) 09/16/2017   Encephalopathy, portal systemic 02/27/2017   Steatohepatitis 12/03/2016   Abnormal liver enzymes 06/17/2016   Hereditary hemochromatosis ([place]) 06/17/2016   Anxiety 07/16/2015   Benign essential hypertension 05/22/2014   H/O traumatic brain injury 05/22/2014   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[place]) 05/22/2014    Orientation RESPIRATION BLADDER Height & Weight     Self, Place, Situation  Normal   Weight: 120 lb (54.4 kg) Height:  5' 3"  (160 cm)  BEHAVIORAL SYMPTOMS/MOOD NEUROLOGICAL BOWEL NUTRITION STATUS      Continent Diet  AMBULATORY STATUS COMMUNICATION OF NEEDS Skin   Limited Assist Verbally Normal                       Personal Care Assistance Level of Assistance  Bathing, Feeding, Dressing, Total care Bathing Assistance: Independent Feeding assistance: Independent Dressing Assistance: Independent Total Care Assistance: Limited assistance   Functional Limitations Info  Sight, Hearing, Speech Sight Info: Adequate Hearing Info: Adequate Speech Info: Adequate    SPECIAL CARE FACTORS FREQUENCY                       Contractures Contractures Info: Present (Right hand)    Additional Factors Info                  Current Medications (09/20/2021):  This is the current hospital active medication list Current Facility-Administered Medications  Medication Dose Route Frequency Provider Last Rate Last Admin   atenolol (TENORMIN) tablet  25 mg  25 mg Oral BID Harvest Dark, MD   25 mg at 09/19/21 2326   atorvastatin (LIPITOR) tablet 10 mg  10 mg Oral Daily Harvest Dark, MD       folic acid (FOLVITE) tablet 1 mg  1 mg Oral Daily Harvest Dark, MD       LORazepam (ATIVAN) injection 0-4 mg  0-4 mg Intravenous Q6H Harvest Dark, MD       Or   LORazepam (ATIVAN) tablet  0-4 mg  0-4 mg Oral Q6H Harvest Dark, MD       Derrill Memo ON 09/22/2021] LORazepam (ATIVAN) injection 0-4 mg  0-4 mg Intravenous Q12H Harvest Dark, MD       Or   Derrill Memo ON 09/22/2021] LORazepam (ATIVAN) tablet 0-4 mg  0-4 mg Oral Q12H Harvest Dark, MD       PARoxetine (PAXIL) tablet 30 mg  30 mg Oral Daily Harvest Dark, MD       thiamine tablet 100 mg  100 mg Oral Daily Harvest Dark, MD       Or   thiamine (B-1) injection 100 mg  100 mg Intravenous Daily Harvest Dark, MD       Current Outpatient Medications  Medication Sig Dispense Refill   acetaminophen (TYLENOL) 500 MG tablet Take 1 tablet (500 mg total) by mouth every 8 (eight) hours as needed for mild pain or fever. 15 tablet 0   Ascorbic Acid (VITAMIN C) 1000 MG tablet Take 1 tablet (1,000 mg total) by mouth daily. 30 tablet 0   aspirin EC 81 MG tablet Take 1 tablet (81 mg total) by mouth daily. Swallow whole. 30 tablet 0   atenolol (TENORMIN) 25 MG tablet Take 1 tablet (25 mg total) by mouth 2 (two) times daily. 30 tablet 0   atorvastatin (LIPITOR) 10 MG tablet Take 10 mg by mouth daily.     baclofen (LIORESAL) 10 MG tablet Take 10 mg by mouth 3 (three) times daily.     Calcium Carb-Cholecalciferol (OYSTER SHELL CALCIUM W/D) 500-200 MG-UNIT TABS Take by mouth daily as needed.     Cholecalciferol 125 MCG (5000 UT) TABS Take 5,000 Units by mouth daily.     doxepin (SINEQUAN) 10 MG capsule Take 10 mg by mouth at bedtime as needed (sleep).      feeding supplement, ENSURE ENLIVE, (ENSURE ENLIVE) LIQD Take 237 mLs by mouth 2 (two) times daily between meals. 921 mL 12   folic acid (FOLVITE) 1 MG tablet Take 1 tablet (1 mg total) by mouth daily. 30 tablet 0   hydrOXYzine (ATARAX/VISTARIL) 25 MG tablet Take 25 mg by mouth 3 (three) times daily as needed for anxiety or itching.      Multiple Vitamin (MULTIVITAMIN WITH MINERALS) TABS tablet Take 1 tablet by mouth daily. 30 tablet 0   PARoxetine (PAXIL) 30 MG tablet  Take 1 tablet (30 mg total) by mouth daily. 30 tablet 0   polyethylene glycol powder (GLYCOLAX/MIRALAX) 17 GM/SCOOP powder Take 17 g by mouth daily.     senna-docusate (SENOKOT-S) 8.6-50 MG tablet Take 2 tablets by mouth at bedtime. 30 tablet 1   thiamine 100 MG tablet Take 1 tablet (100 mg total) by mouth daily. 30 tablet 0   vitamin C (ASCORBIC ACID) 500 MG tablet Take 500 mg by mouth 2 (two) times daily.     Sod Picosulfate-Mag Ox-Cit Acd (CLENPIQ) 10-3.5-12 MG-GM -GM/160ML SOLN Take 320 mLs by mouth as directed. (Patient not taking: Reported on 09/19/2021) 320 mL 0  Discharge Medications: Please see discharge summary for a list of discharge medications.  Relevant Imaging Results:  Relevant Lab Results:   Additional Information SS# [id number]  Adelene Amas, LCSWA

## 2021-09-20 NOTE — ED Notes (Signed)
PT at bedside with patient

## 2021-09-20 NOTE — ED Provider Notes (Signed)
Today's Vitals   09/19/21 1615  BP: (!) 139/93  Pulse: 94  Resp: 18  Temp: 98 F (36.7 C)  TempSrc: Oral  SpO2: 96%  Weight: 54.4 kg  Height: 5' 3"  (1.6 m)  PainSc: 0-No pain   Body mass index is 21.26 kg/m.   Patient resting comfortably with no acute events overnight.  Awaiting social work evaluation for further disposition.   , Delice Bison, DO 09/20/21 [phone]
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
## 2021-09-20 NOTE — ED Notes (Signed)
Patient given coffee. 

## 2021-09-20 NOTE — ED Notes (Signed)
Patient sitting in chair eating breakfast tray at this time

## 2021-09-20 NOTE — ED Notes (Signed)
Dc ppw provided to legal guardian. Pt verbal consent for DC provided. PT placed in wheelchair and assisted to vehicle of Guardian. Pt denies questions or needs at this time.

## 2021-11-27 ENCOUNTER — Other Ambulatory Visit: Payer: Self-pay

## 2021-11-27 ENCOUNTER — Emergency Department: Payer: Medicare HMO

## 2021-11-27 ENCOUNTER — Emergency Department
Admission: EM | Admit: 2021-11-27 | Discharge: 2021-12-02 | Disposition: A | Discharge status: Home / Self Care | Payer: Medicare HMO | Attending: Emergency Medicine | Admitting: Emergency Medicine

## 2021-11-27 ENCOUNTER — Encounter: Payer: Self-pay | Admitting: Emergency Medicine

## 2021-11-27 DIAGNOSIS — R2681 Unsteadiness on feet: Secondary | ICD-10-CM | POA: Insufficient documentation

## 2021-11-27 DIAGNOSIS — R1013 Epigastric pain: Secondary | ICD-10-CM | POA: Diagnosis not present

## 2021-11-27 DIAGNOSIS — Z20822 Contact with and (suspected) exposure to covid-19: Secondary | ICD-10-CM | POA: Insufficient documentation

## 2021-11-27 DIAGNOSIS — R197 Diarrhea, unspecified: Secondary | ICD-10-CM | POA: Insufficient documentation

## 2021-11-27 DIAGNOSIS — E860 Dehydration: Secondary | ICD-10-CM | POA: Insufficient documentation

## 2021-11-27 DIAGNOSIS — R110 Nausea: Secondary | ICD-10-CM | POA: Insufficient documentation

## 2021-11-27 DIAGNOSIS — R7401 Elevation of levels of liver transaminase levels: Secondary | ICD-10-CM | POA: Diagnosis not present

## 2021-11-27 DIAGNOSIS — M6281 Muscle weakness (generalized): Secondary | ICD-10-CM | POA: Diagnosis not present

## 2021-11-27 LAB — COMPREHENSIVE METABOLIC PANEL
ALT: 107 U/L — ABNORMAL HIGH (ref 0–44)
AST: 212 U/L — ABNORMAL HIGH (ref 15–41)
Albumin: 4.1 g/dL (ref 3.5–5.0)
Alkaline Phosphatase: 116 U/L (ref 38–126)
Anion gap: 11 (ref 5–15)
BUN: 6 mg/dL — ABNORMAL LOW (ref 8–23)
CO2: 26 mmol/L (ref 22–32)
Calcium: 8.4 mg/dL — ABNORMAL LOW (ref 8.9–10.3)
Chloride: 95 mmol/L — ABNORMAL LOW (ref 98–111)
Creatinine, Ser: 0.61 mg/dL (ref 0.61–1.24)
GFR, Estimated: >60 mL/min (ref >60)
Glucose, Bld: 103 mg/dL — ABNORMAL HIGH (ref 70–99)
Potassium: 3.8 mmol/L (ref 3.5–5.1)
Sodium: 132 mmol/L — ABNORMAL LOW (ref 135–145)
Total Bilirubin: 0.7 mg/dL (ref 0.3–1.2)
Total Protein: 7.2 g/dL (ref 6.5–8.1)

## 2021-11-27 LAB — CBC
HCT: 43.2 % (ref 39.0–52.0)
Hemoglobin: 15 g/dL (ref 13.0–17.0)
MCH: 33 pg (ref 26.0–34.0)
MCHC: 34.7 g/dL (ref 30.0–36.0)
MCV: 95.2 fL (ref 80.0–100.0)
Platelets: 123 10*3/uL — ABNORMAL LOW (ref 150–400)
RBC: 4.54 MIL/uL (ref 4.22–5.81)
RDW: 12.6 % (ref 11.5–15.5)
WBC: 6.3 10*3/uL (ref 4.0–10.5)
nRBC: 0 % (ref 0.0–0.2)

## 2021-11-27 LAB — URINALYSIS, ROUTINE W REFLEX MICROSCOPIC
Bilirubin Urine: NEGATIVE
Glucose, UA: NEGATIVE mg/dL
Ketones, ur: NEGATIVE mg/dL
Leukocytes,Ua: NEGATIVE
Nitrite: NEGATIVE
Protein, ur: NEGATIVE mg/dL
Specific Gravity, Urine: <1.005 — ABNORMAL LOW (ref 1.005–1.030)
pH: 6 (ref 5.0–8.0)

## 2021-11-27 LAB — URINALYSIS, MICROSCOPIC (REFLEX)
Bacteria, UA: NONE SEEN
Squamous Epithelial / HPF: NONE SEEN (ref 0–5)

## 2021-11-27 LAB — LIPASE, BLOOD: Lipase: 114 U/L — ABNORMAL HIGH (ref 11–51)

## 2021-11-27 MED ORDER — SODIUM CHLORIDE 0.9 % IV BOLUS
1000.0000 mL | Freq: Once | INTRAVENOUS | Status: AC
  Administered 2021-11-27: 16:00:00 1000 mL via INTRAVENOUS

## 2021-11-27 MED ORDER — ONDANSETRON HCL 4 MG/2ML IJ SOLN
4.0000 mg | Freq: Once | INTRAMUSCULAR | Status: AC
  Administered 2021-11-27: 16:00:00 4 mg via INTRAVENOUS
  Filled 2021-11-27: qty 2

## 2021-11-27 MED ORDER — LORAZEPAM 2 MG/ML IJ SOLN
2.0000 mg | Freq: Once | INTRAMUSCULAR | Status: AC
Start: 2021-11-27 — End: 2021-11-27
  Administered 2021-11-27: 18:00:00 2 mg via INTRAVENOUS
  Filled 2021-11-27: qty 1

## 2021-11-27 MED ORDER — IOHEXOL 300 MG/ML  SOLN
80.0000 mL | Freq: Once | INTRAMUSCULAR | Status: AC | PRN
  Administered 2021-11-27: 17:00:00 80 mL via INTRAVENOUS
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
## 2021-11-27 NOTE — ED Triage Notes (Signed)
Pt comes into the ED via [facility] from home c/o diarrhea that has been an ongoing problem for a couple months with some new skin breakdown.  DSS told the patient he had to come here to be evaluated.    83 HR 95% RA 129/71

## 2021-11-27 NOTE — ED Provider Notes (Signed)
[facility] Provider Note    Event Date/Time   First MD Initiated Contact with Patient 11/27/21 [phone]     (approximate)   History   Diarrhea   HPI Douglas Peterson. is a 71 y.o. male with a history of EtOH abuse and alcoholic cirrhosis who presents for midepigastric abdominal pain, nausea, and diarrhea.  Patient states that this diarrhea has been present over the last few months however patient states that he has not had a bowel movement in the last 5 days.  Patient does state that over the last 3 days he has had worsening midepigastric abdominal pain with nausea but no vomiting.  Patient states that he has had nothing to eat except alcohol over the last 5 days and has been decreasing his intake over the past 2.  Patient states that he is fairly concerned that he may go through withdrawals here in our hospital.     Physical Exam   Triage Vital Signs: ED Triage Vitals  Enc Vitals Group     BP 11/27/21 1356 138/87     Pulse Rate 11/27/21 1356 76     Resp 11/27/21 1356 16     Temp 11/27/21 1356 98.2 F (36.8 C)     Temp Source 11/27/21 1356 Oral     SpO2 11/27/21 1356 97 %     Weight 11/27/21 1354 119 lb 14.9 oz (54.4 kg)     Height 11/27/21 1354 5' 3"  (1.6 m)     Head Circumference --      Peak Flow --      Pain Score 11/27/21 1354 5     Pain Loc --      Pain Edu? --      Excl. in [place]? --     Most recent vital signs: Vitals:   11/27/21 2000 11/27/21 2230  BP: 115/72 115/74  Pulse: 70 67  Resp: 15 15  Temp:    SpO2: 97% 99%    General: Awake, no distress.  CV:  Good peripheral perfusion.  Resp:  Normal effort.  Abd:  No distention.  Other:  Chronically ill-appearing elderly Caucasian male in bed in no distress   ED Results / Procedures / Treatments   Labs (all labs ordered are listed, but only abnormal results are displayed) Labs Reviewed  LIPASE, BLOOD - Abnormal; Notable for the following components:      Result Value   Lipase 114  (*)    All other components within normal limits  COMPREHENSIVE METABOLIC PANEL - Abnormal; Notable for the following components:   Sodium 132 (*)    Chloride 95 (*)    Glucose, Bld 103 (*)    BUN 6 (*)    Calcium 8.4 (*)    AST 212 (*)    ALT 107 (*)    All other components within normal limits  CBC - Abnormal; Notable for the following components:   Platelets 123 (*)    All other components within normal limits  URINALYSIS, ROUTINE W REFLEX MICROSCOPIC - Abnormal; Notable for the following components:   Specific Gravity, Urine <1.005 (*)    Hgb urine dipstick TRACE (*)    All other components within normal limits  RESP PANEL BY RT-PCR (FLU A&B, COVID) ARPGX2  URINALYSIS, MICROSCOPIC (REFLEX)   RADIOLOGY ED MD interpretation: CT of the abdomen and pelvis shows stable chronic findings of hernia and diverticulosis without any evidence of acute abnormalities and specifically no evidence of acute pancreatitis.  Agree with radiology assessment  Official radiology report(s): CT Abdomen Pelvis W Contrast  Result Date: 11/27/2021 CLINICAL DATA:  Acute, severe pancreatitis.  Ongoing diarrhea. EXAM: CT ABDOMEN AND PELVIS WITH CONTRAST TECHNIQUE: Multidetector CT imaging of the abdomen and pelvis was performed using the standard protocol following bolus administration of intravenous contrast. RADIATION DOSE REDUCTION: This exam was performed according to the departmental dose-optimization program which includes automated exposure control, adjustment of the mA and/or kV according to patient size and/or use of iterative reconstruction technique. CONTRAST:  29m OMNIPAQUE IOHEXOL 300 MG/ML  SOLN COMPARISON:  08/05/2020 FINDINGS: Lower chest: Stable previously noted bilateral gynecomastia. Mild bibasilar atelectasis/scarring without significant change. Previously demonstrated low density distal esophageal wall thickening has resolved. Hepatobiliary: Marked diffuse low density of the liver relative to  the spleen with progression. Normal appearing gallbladder. Pancreas: Unremarkable. No pancreatic ductal dilatation or surrounding inflammatory changes. Spleen: Normal in size without focal abnormality. Adrenals/Urinary Tract: Adrenal glands are unremarkable. Kidneys are normal, without renal calculi, focal lesion, or hydronephrosis. Bladder is unremarkable. Stomach/Bowel: The mid sigmoid colon extends into a large left inguinal hernia with a sigmoid colon diverticulum within the hernia without evidence of diverticulitis. No colon wall thickening, pneumatosis or luminal dilation. Unremarkable stomach and small bowel.  Surgically absent appendix. Vascular/Lymphatic: Mild atheromatous arterial calcifications without aneurysm. No enlarged lymph nodes. Reproductive: Mildly to moderately enlarged prostate gland. Other: Large left inguinal hernia containing herniated fat and sigmoid colon. Moderate-sized right inguinal hernia containing fat. Musculoskeletal: Stable L2 vertebral compression deformity with kyphoplasty material. Stable L1 vertebral compression deformity and mild T8 vertebral compression deformity. No acute fractures or subluxations. IMPRESSION: 1. No acute abnormality. 2. Stable large left inguinal hernia containing herniated sigmoid colon without obstruction or strangulation. 3. Moderate-sized right inguinal hernia containing fat. 4. Mild sigmoid diverticulosis without evidence of diverticulitis. 5. Marked diffuse hepatic steatosis with progression. Electronically Signed   By: SClaudie ReveringM.D.   On: 11/27/2021 17:18      PROCEDURES:  Critical Care performed: No  .1-3 Lead EKG Interpretation Performed by: BNaaman Plummer MD Authorized by: BNaaman Plummer MD     Interpretation: normal     ECG rate:  66   ECG rate assessment: normal     Rhythm: sinus rhythm     Ectopy: none     Conduction: normal     MEDICATIONS ORDERED IN ED: Medications  LORazepam (ATIVAN) tablet 1-4 mg (has no  administration in time range)    Or  LORazepam (ATIVAN) injection 1-4 mg (has no administration in time range)  thiamine tablet 100 mg (has no administration in time range)    Or  thiamine (B-1) injection 100 mg (has no administration in time range)  folic acid (FOLVITE) tablet 1 mg (has no administration in time range)  multivitamin with minerals tablet 1 tablet (has no administration in time range)  ondansetron (ZOFRAN) injection 4 mg (4 mg Intravenous Given 11/27/21 1619)  sodium chloride 0.9 % bolus 1,000 mL (0 mLs Intravenous Stopped 11/27/21 1812)  iohexol (OMNIPAQUE) 300 MG/ML solution 80 mL (80 mLs Intravenous Contrast Given 11/27/21 1642)  LORazepam (ATIVAN) injection 2 mg (2 mg Intravenous Given 11/27/21 1823)     IMPRESSION / MDM / ASSESSMENT AND PLAN / ED COURSE  I reviewed the triage vital signs and the nursing notes.                              Differential  diagnosis includes, but is not limited to, pancreatitis, gastritis, GI bleeding, bleeding esophageal varices  The patient is on the cardiac monitor to evaluate for evidence of arrhythmia and/or significant heart rate changes.  Patient 71 year old male well-known to our facility and past medical history significant for alcohol abuse who presents for mainly midepigastric abdominal pain after 5 days of only alcohol p.o.  With subsequent diarrhea.  Patient does show evidence of mild lipase elevation to 114 as well as elevation of his AST/ALT from baseline to 212/107.  CT shows no evidence of acute pancreatitis.  Patient rehydrated mostly orally and given 2 mg of Ativan due to patient's complaints of anxiety and withdrawal symptoms. Of note I spoke to patient's legal guardian and DSS worker Toula Moos [phone]) who stated that patient has been accepted to a long-term inpatient facility however he is unsafe to go home at this time as his caregiver at home is also a substance abuser.  Therefore patient will be boarding in our  emergency department for placement tomorrow.  Dispo: Boarding       FINAL CLINICAL IMPRESSION(S) / ED DIAGNOSES   Final diagnoses:  Diarrhea, unspecified type  Dehydration     Rx / DC Orders   ED Discharge Orders     None        Note:  This document was prepared using Dragon voice recognition software and may include unintentional dictation errors.   Naaman Plummer, MD 11/28/21 0005

## 2021-11-27 NOTE — ED Notes (Signed)
Pt sleeping at this time. NAD noted, respirations even and unlabored.

## 2021-11-28 LAB — RESP PANEL BY RT-PCR (FLU A&B, COVID) ARPGX2
Influenza A by PCR: NEGATIVE
Influenza B by PCR: NEGATIVE
SARS Coronavirus 2 by RT PCR: NEGATIVE

## 2021-11-28 MED ORDER — ENSURE ENLIVE PO LIQD
237.0000 mL | Freq: Two times a day (BID) | ORAL | Status: DC
  Administered 2021-12-01 – 2021-12-02 (×3): 237 mL via ORAL

## 2021-11-28 MED ORDER — LORAZEPAM 1 MG PO TABS
1.0000 mg | ORAL_TABLET | ORAL | Status: AC | PRN
  Administered 2021-11-28 – 2021-11-30 (×7): 1 mg via ORAL
  Filled 2021-11-28 (×7): qty 1

## 2021-11-28 MED ORDER — LORAZEPAM 2 MG/ML IJ SOLN
1.0000 mg | INTRAMUSCULAR | Status: AC | PRN
  Administered 2021-11-28 – 2021-11-29 (×4): 2 mg via INTRAVENOUS
  Filled 2021-11-28 (×5): qty 1

## 2021-11-28 MED ORDER — HYDROXYZINE HCL 25 MG PO TABS
25.0000 mg | ORAL_TABLET | Freq: Three times a day (TID) | ORAL | Status: DC | PRN
  Administered 2021-11-28 – 2021-12-02 (×4): 25 mg via ORAL
  Filled 2021-11-28 (×4): qty 1

## 2021-11-28 MED ORDER — ADULT MULTIVITAMIN W/MINERALS CH
1.0000 | ORAL_TABLET | Freq: Every day | ORAL | Status: DC
  Administered 2021-11-28 – 2021-12-02 (×5): 1 via ORAL
  Filled 2021-11-28 (×5): qty 1

## 2021-11-28 MED ORDER — ATORVASTATIN CALCIUM 20 MG PO TABS
10.0000 mg | ORAL_TABLET | Freq: Every day | ORAL | Status: DC
  Administered 2021-11-28 – 2021-12-02 (×5): 10 mg via ORAL
  Filled 2021-11-28 (×5): qty 1

## 2021-11-28 MED ORDER — ATENOLOL 25 MG PO TABS
25.0000 mg | ORAL_TABLET | Freq: Two times a day (BID) | ORAL | Status: DC
  Administered 2021-11-28 – 2021-12-01 (×7): 25 mg via ORAL
  Filled 2021-11-28 (×9): qty 1

## 2021-11-28 MED ORDER — THIAMINE HCL 100 MG PO TABS
100.0000 mg | ORAL_TABLET | Freq: Every day | ORAL | Status: DC
  Administered 2021-11-28 – 2021-12-02 (×5): 100 mg via ORAL
  Filled 2021-11-28 (×5): qty 1

## 2021-11-28 MED ORDER — TUBERCULIN PPD 5 UNIT/0.1ML ID SOLN
5.0000 [IU] | INTRADERMAL | Status: AC
  Administered 2021-11-28: 5 [IU] via INTRADERMAL
  Filled 2021-11-28: qty 0.1

## 2021-11-28 MED ORDER — THIAMINE HCL 100 MG/ML IJ SOLN
100.0000 mg | Freq: Every day | INTRAMUSCULAR | Status: DC
Start: 2021-11-28 — End: 2021-12-02

## 2021-11-28 MED ORDER — ASCORBIC ACID 500 MG PO TABS
1000.0000 mg | ORAL_TABLET | Freq: Every day | ORAL | Status: DC
  Administered 2021-11-28 – 2021-12-02 (×5): 1000 mg via ORAL
  Filled 2021-11-28 (×5): qty 2

## 2021-11-28 MED ORDER — ASPIRIN EC 81 MG PO TBEC
81.0000 mg | DELAYED_RELEASE_TABLET | Freq: Every day | ORAL | Status: DC
  Administered 2021-11-28 – 2021-12-02 (×5): 81 mg via ORAL
  Filled 2021-11-28 (×5): qty 1

## 2021-11-28 MED ORDER — FOLIC ACID 1 MG PO TABS
1.0000 mg | ORAL_TABLET | Freq: Every day | ORAL | Status: DC
  Administered 2021-11-28 – 2021-12-02 (×5): 1 mg via ORAL
  Filled 2021-11-28 (×5): qty 1

## 2021-11-28 MED ORDER — BACLOFEN 10 MG PO TABS
10.0000 mg | ORAL_TABLET | Freq: Three times a day (TID) | ORAL | Status: DC
  Administered 2021-11-28 – 2021-12-02 (×13): 10 mg via ORAL
  Filled 2021-11-28 (×15): qty 1

## 2021-11-28 NOTE — ED Notes (Signed)
Informed by ED secretary that pt has called his Education officer, museum and guardian already this morning to complain that he is not being taken care of. Also told by ED secretary that yesterday, same pt called 011 multiple times from room. Call bell was going off, entered room and provided snacks (graham crackers, PB, applesauce, water) and assured pt that breakfast is brought around 0930. Repositioned pt in bed.

## 2021-11-28 NOTE — ED Notes (Signed)
Pt calling out requesting pain meds. Does not have PRN pain meds at this time. Will ask EDP.

## 2021-11-28 NOTE — ED Notes (Signed)
Sent med message to have pharmacy send AM baclofen for pt.

## 2021-11-28 NOTE — ED Notes (Signed)
Family at bedside visiting patient.  Pt was calling on call bell because "I am shaking". Ativan already given and has called out for this several times already. Pt is resting comfortably in bed.

## 2021-11-28 NOTE — ED Provider Notes (Signed)
Patient boarding in ER, awaiting TOC consultation.  No acute events overnight.  Have continued patient home medications which have written for this morning  Vitals:   11/28/21 0530 11/28/21 0600  BP: (!) 159/91 (!) 148/86  Pulse: 82 72  Resp: 14   Temp:    SpO2: 92% 92%    Ongoing care assigned to Dr. Doree Albee, MD 11/28/21 0700

## 2021-11-28 NOTE — NC FL2 (Signed)
[place] LEVEL OF CARE SCREENING TOOL     IDENTIFICATION  Patient Name: Douglas Peterson. Birthdate: 12-10-1950 Sex: male Admission Date (Current Location): 11/27/2021  [facility] and [place] Number:  Engineering geologist and Address:  [facility], [address], [place], [place] [address]      Provider Number: [id number]  Attending Physician Name and Address:  No att. providers found  Relative Name and Phone Number:  DSS SW Cindi Carbon- [phone]    Current Level of Care: Hospital Recommended Level of Care: [place] Prior Approval Number:    Date Approved/Denied:   PASRR Number:    Discharge Plan: Domiciliary (Rest home) (ALF)    Current Diagnoses: Patient Active Problem List   Diagnosis Date Noted   Dysphonia 08/13/2021   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   Paresthesia of right upper extremity 08/13/2021   SIRS (systemic inflammatory response syndrome) ([place]) 08/05/2020   GERD (gastroesophageal reflux disease) 06/05/2020   Alcohol use disorder, severe, dependence ([place]) 05/28/2020   Paroxysmal A-fib ([place]) 03/17/2020   Severe protein-calorie malnutrition ([place]) 02/25/2020   Primary insomnia 02/06/2020   Left inguinal hernia 01/31/2020   Encounter for competency evaluation    Pancytopenia ([place])    Alcoholic cirrhosis of liver without ascites (HCC)    Thrombocytopenia ([place])    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[place]) 09/27/2019   Alcohol withdrawal delirium, acute, hyperactive ([place]) 09/21/2019   Pressure injury of ankle, stage 1 08/15/2019   Palliative care by specialist    Alcohol withdrawal ([place]) 03/20/2019   Closed fracture of right proximal humerus 03/19/2019   Vitamin D deficiency 01/11/2019   Osteoporosis 12/22/2018   Closed nondisplaced fracture of acromial process of right scapula with routine healing 11/15/2018   Compression fracture of L2 vertebra with routine  healing 11/15/2018   Delirium tremens ([place]) 03/08/2018   HTN (hypertension) 09/16/2017   Encephalopathy, portal systemic 02/27/2017   Steatohepatitis 12/03/2016   Abnormal liver enzymes 06/17/2016   Hereditary hemochromatosis ([place]) 06/17/2016   Anxiety 07/16/2015   Benign essential hypertension 05/22/2014   H/O traumatic brain injury 05/22/2014   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[place]) 05/22/2014    Orientation RESPIRATION BLADDER Height & Weight     Self, Time, Place  Normal Continent Weight: 54.4 kg Height:  5' 3"  (160 cm)  BEHAVIORAL SYMPTOMS/MOOD NEUROLOGICAL BOWEL NUTRITION STATUS      Incontinent Diet (regular)  AMBULATORY STATUS COMMUNICATION OF NEEDS Skin   Supervision Verbally PU Stage and Appropriate Care PU Stage 1 Dressing: Daily (stage 1 cocyx)                     Personal Care Assistance Level of Assistance  Bathing, Feeding, Dressing Bathing Assistance: Limited assistance Feeding assistance: Limited assistance Dressing Assistance: Limited assistance     Functional Limitations Info  Sight, Hearing, Speech Sight Info: Adequate Hearing Info: Adequate Speech Info: Adequate    SPECIAL CARE FACTORS FREQUENCY                       Contractures Contractures Info: Present (right arm)    Additional Factors Info  Code Status, Allergies Code Status Info: Full Allergies Info: Hydrochlorothiazide           Current Medications (11/28/2021):  This is the current hospital active medication list Current Facility-Administered Medications  Medication Dose Route Frequency Provider Last Rate Last Admin   ascorbic acid (VITAMIN C) tablet 1,000  mg  1,000 mg Oral Daily Delman Kitten, MD   1,000 mg at 11/28/21 1607   aspirin EC tablet 81 mg  81 mg Oral Daily Delman Kitten, MD   81 mg at 11/28/21 0903   atenolol (TENORMIN) tablet 25 mg  25 mg Oral BID Delman Kitten, MD   25 mg at 11/28/21 3710   atorvastatin (LIPITOR) tablet 10 mg  10 mg Oral Daily Delman Kitten, MD   10 mg  at 11/28/21 6269   baclofen (LIORESAL) tablet 10 mg  10 mg Oral TID Delman Kitten, MD   10 mg at 11/28/21 0902   feeding supplement (ENSURE ENLIVE / ENSURE PLUS) liquid 237 mL  237 mL Oral BID BM Delman Kitten, MD       folic acid (FOLVITE) tablet 1 mg  1 mg Oral Daily Naaman Plummer, MD   1 mg at 11/28/21 4854   hydrOXYzine (ATARAX) tablet 25 mg  25 mg Oral TID PRN Delman Kitten, MD   25 mg at 11/28/21 0755   LORazepam (ATIVAN) tablet 1-4 mg  1-4 mg Oral Q1H PRN Naaman Plummer, MD   1 mg at 11/28/21 1200   Or   LORazepam (ATIVAN) injection 1-4 mg  1-4 mg Intravenous Q1H PRN Naaman Plummer, MD       multivitamin with minerals tablet 1 tablet  1 tablet Oral Daily Naaman Plummer, MD   1 tablet at 11/28/21 0902   thiamine tablet 100 mg  100 mg Oral Daily Naaman Plummer, MD   100 mg at 11/28/21 6270   Or   thiamine (B-1) injection 100 mg  100 mg Intravenous Daily Naaman Plummer, MD       tuberculin injection 5 Units  5 Units Intradermal STAT Lavonia Drafts, MD   5 Units at 11/28/21 1016   Current Outpatient Medications  Medication Sig Dispense Refill   acetaminophen (TYLENOL) 500 MG tablet Take 1 tablet (500 mg total) by mouth every 8 (eight) hours as needed for mild pain or fever. 15 tablet 0   Ascorbic Acid (VITAMIN C) 1000 MG tablet Take 1 tablet (1,000 mg total) by mouth daily. 30 tablet 0   aspirin EC 81 MG tablet Take 1 tablet (81 mg total) by mouth daily. Swallow whole. 30 tablet 0   atenolol (TENORMIN) 25 MG tablet Take 1 tablet (25 mg total) by mouth 2 (two) times daily. 30 tablet 0   atorvastatin (LIPITOR) 10 MG tablet Take 1 tablet (10 mg total) by mouth daily. 30 tablet 0   baclofen (LIORESAL) 10 MG tablet Take 1 tablet (10 mg total) by mouth 3 (three) times daily. 30 each 0   Calcium Carb-Cholecalciferol (OYSTER SHELL CALCIUM W/D) 500-200 MG-UNIT TABS Take 1 tablet by mouth daily as needed. 30 tablet 0   Cholecalciferol 125 MCG (5000 UT) TABS Take 1 tablet (5,000 Units total) by mouth  daily. 30 tablet 0   doxepin (SINEQUAN) 10 MG capsule Take 1 capsule (10 mg total) by mouth at bedtime as needed (sleep). 30 capsule 0   feeding supplement (ENSURE ENLIVE / ENSURE PLUS) LIQD Take 237 mLs by mouth 2 (two) times daily between meals. 350 mL 12   folic acid (FOLVITE) 1 MG tablet Take 1 tablet (1 mg total) by mouth daily. 30 tablet 0   hydrOXYzine (ATARAX/VISTARIL) 25 MG tablet Take 1 tablet (25 mg total) by mouth 3 (three) times daily as needed for anxiety or itching. 30 tablet 0   Multiple  Vitamin (MULTIVITAMIN WITH MINERALS) TABS tablet Take 1 tablet by mouth daily. 30 tablet 0   PARoxetine (PAXIL) 30 MG tablet Take 1 tablet (30 mg total) by mouth daily. 30 tablet 0   polyethylene glycol powder (GLYCOLAX/MIRALAX) 17 GM/SCOOP powder Take 17 g by mouth daily. 255 g 0   senna-docusate (SENOKOT-S) 8.6-50 MG tablet Take 2 tablets by mouth at bedtime. 30 tablet 1   Sod Picosulfate-Mag Ox-Cit Acd (CLENPIQ) 10-3.5-12 MG-GM -GM/160ML SOLN Take 320 mLs by mouth as directed. 320 mL 0   thiamine 100 MG tablet Take 1 tablet (100 mg total) by mouth daily. 30 tablet 0     Discharge Medications: Please see discharge summary for a list of discharge medications.  Relevant Imaging Results:  Relevant Lab Results:   Additional Information SS# [id number]  Shelbie Hutching, RN

## 2021-11-28 NOTE — ED Notes (Signed)
Breakfast tray provided to pt. Pt has no complaints at this time.

## 2021-11-28 NOTE — TOC Initial Note (Signed)
Transition of Care (TOC) - Initial/Assessment Note    Patient Details  Name: Douglas Peterson. MRN: [id number] Date of Birth: [birth year]  Transition of Care [facility]) CM/SW Contact:    Shelbie Hutching, RN Phone Number: 11/28/2021, 12:39 PM  Clinical Narrative:                 Patient came into the hospital for diarrhea that has been going home for a couple of months, skin breakdown and alcohol withdrawal.  Patient does not need to be admitted at this time but he is not safe to return home.  Patient has a guardian with [facility], Toula Moos reports that they have been following patient and that he was supposed to be placed in ALF back in November but the facility did not have a room at the time so they let the patient return home arranging caregivers.  Once patient at home he refused to leave.  Now that patient has come to the hospital DSS will not allow him to return home. Barrie Lyme reports that she is hopeful that [facility] will be able to accept him, they will need a new PPD and FL2.  PPD ordered and FL2 in and signed.  RNCM will secure Email the FL2 to [facility] today.   RNCM met with patient at the bedside, he reports that he does not want to go anywhere but home and that he will hire come caregivers to come in so he doesn't need to be placed. RNCM did not inform patient at this time that DSS will not allow him to return home.  Once placement details are confirmed and set RNCM will update patient with plan of care.    Expected Discharge Plan: Assisted Living Barriers to Discharge: Continued Medical Work up, Unsafe home situation   Patient Goals and CMS Choice Patient states their goals for this hospitalization and ongoing recovery are:: Patient wants to go home but DSS SW guardians are going to have him placed in ALF CMS Medicare.gov Compare Post Acute Care list provided to:: Legal Guardian Choice offered to / list presented to : [facility] POA / Guardian  Expected Discharge Plan and  Services Expected Discharge Plan: Assisted Living   Discharge Planning Services: CM Consult   Living arrangements for the past 2 months: Single Family Home                 DME Arranged: N/A DME Agency: NA       HH Arranged: NA HH Agency: NA        Prior Living Arrangements/Services Living arrangements for the past 2 months: Single Family Home Lives with:: Self Patient language and need for interpreter reviewed:: Yes Do you feel safe going back to the place where you live?: No   Patient not safe to return home per DSS SW  Need for Family Participation in Patient Care: Yes (Comment) Care giver support system in place?: Yes (comment) (DSS) Current home services: Homehealth aide Criminal Activity/Legal Involvement Pertinent to Current Situation/Hospitalization: No - Comment as needed  Activities of Daily Living      Permission Sought/Granted Permission sought to share information with : Case Manager, [place] granted to share information with : Yes, Verbal Permission Granted  Share Information with NAME: Toula Moos  Permission granted to share info w AGENCY: [facility]  Permission granted to share info w Relationship: DSS Legal guardian  Permission granted to share info w Contact Information: [phone]  Emotional Assessment Appearance:: Appears stated age Attitude/Demeanor/Rapport: Engaged Affect (typically observed): Accepting Orientation: : Oriented to Self, Oriented to Place, Oriented to  Time Alcohol / Substance Use: Alcohol Use Psych Involvement: No (comment)  Admission diagnosis:  diarrhea Patient Active Problem List   Diagnosis Date Noted   Dysphonia 08/13/2021   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   Paresthesia of right upper extremity 08/13/2021   SIRS (systemic inflammatory response syndrome) ([place]) 08/05/2020   GERD (gastroesophageal reflux disease) 06/05/2020   Alcohol use disorder, severe,  dependence ([place]) 05/28/2020   Paroxysmal A-fib ([place]) 03/17/2020   Severe protein-calorie malnutrition ([place]) 02/25/2020   Primary insomnia 02/06/2020   Left inguinal hernia 01/31/2020   Encounter for competency evaluation    Pancytopenia ([place])    Alcoholic cirrhosis of liver without ascites ([place])    Thrombocytopenia ([place])    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[place]) 09/27/2019   Alcohol withdrawal delirium, acute, hyperactive ([place]) 09/21/2019   Pressure injury of ankle, stage 1 08/15/2019   Palliative care by specialist    Alcohol withdrawal ([place]) 03/20/2019   Closed fracture of right proximal humerus 03/19/2019   Vitamin D deficiency 01/11/2019   Osteoporosis 12/22/2018   Closed nondisplaced fracture of acromial process of right scapula with routine healing 11/15/2018   Compression fracture of L2 vertebra with routine healing 11/15/2018   Delirium tremens ([place]) 03/08/2018   HTN (hypertension) 09/16/2017   Encephalopathy, 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 [place] [address] Phone: [phone] Fax: [phone]     Social Determinants of Health (SDOH) Interventions    Readmission Risk Interventions Readmission Risk Prevention Plan 08/23/2020 08/19/2020 08/14/2020  Transportation Screening Complete Complete Complete  PCP or Specialist Appt within 3-5 Days - - -  HRI or [place] - - -  Palliative Care Screening - - -  Medication Review (RN Care Manager) Complete Complete -  PCP or Specialist appointment within 3-5 days of discharge (No Data) Complete Complete  HRI or Home Care  Consult Complete - Complete  SW Recovery Care/Counseling Consult - Complete Complete  Palliative Care Screening - Not Applicable Not Applicable  Skilled Nursing Facility Not Applicable - Patient Refused  Some recent data might be hidden

## 2021-11-28 NOTE — ED Notes (Signed)
Provided more applesauce to pt.

## 2021-11-28 NOTE — ED Notes (Signed)
Pt sleeping at this time. NAD noted, respirations even and unlabored

## 2021-11-28 NOTE — ED Notes (Addendum)
Pt is lethargic from previous medication to take PO medication at this time.

## 2021-11-28 NOTE — ED Notes (Signed)
Discussed PRN pain meds with EDP. Per EDP, give scheduled muscle relaxer early (due at 10am).

## 2021-11-28 NOTE — ED Notes (Signed)
Provided ice water. Pt ate breakfast tray and drank other water.

## 2021-11-28 NOTE — TOC Progression Note (Signed)
Transition of Care (TOC) - Progression Note    Patient Details  Name: Douglas Peterson. MRN: [id number] Date of Birth: 10-15-1951  Transition of Care [facility]) CM/SW Contact  Shelbie Hutching, RN Phone Number: 11/28/2021, 4:51 PM  Clinical Narrative:    [facility] came to evaluate the patient today for in home services, this was arranged by DSS, [place] DSS SW is also looking into [facility] for placement.     Expected Discharge Plan: Assisted Living Barriers to Discharge: Continued Medical Work up, Unsafe home situation  Expected Discharge Plan and Services Expected Discharge Plan: Assisted Living   Discharge Planning Services: CM Consult   Living arrangements for the past 2 months: Single Family Home                 DME Arranged: N/A DME Agency: NA       HH Arranged: NA HH Agency: NA         Social Determinants of Health (SDOH) Interventions    Readmission Risk Interventions Readmission Risk Prevention Plan 08/23/2020 08/19/2020 08/14/2020  Transportation Screening Complete Complete Complete  PCP or Specialist Appt within 3-5 Days - - -  HRI or [place] - - -  Palliative Care Screening - - -  Medication Review (RN Care Manager) Complete Complete -  PCP or Specialist appointment within 3-5 days of discharge (No Data) Complete Complete  HRI or Home Care Consult Complete - Complete  SW Recovery Care/Counseling Consult - Complete Complete  Palliative Care Screening - Not Applicable Not [place] Not Applicable - Patient Refused  Some recent data might be hidden

## 2021-11-29 NOTE — ED Notes (Signed)
[name] resting comfortably. Sleeping. Scoring 0 on CIWA scale since receiving dose of Ativan.

## 2021-11-29 NOTE — ED Notes (Signed)
Pt assisted with some food. Pt has heavy tremor currently.

## 2021-11-29 NOTE — ED Notes (Signed)
Pt alert and laying calmly on stretcher. Stretcher locked low. Rails up. Call bell within reach. Door left open per pt request.

## 2021-11-29 NOTE — ED Notes (Signed)
[name] briefs dry currently. Pt assisted to reposition in bed. Pt walked through some ROM exercises. Pt reoriented to situation. Stretcher locked low. Rails up. Call bell within reach. Door open and pt's room close to nurse's station.

## 2021-11-29 NOTE — ED Notes (Signed)
Student medic at bedside assisting pt to use urinal.

## 2021-11-29 NOTE — Evaluation (Signed)
Physical Therapy Evaluation Patient Details Name: Douglas Peterson. MRN: [id number] DOB: November 30, 1950 Today's Date: 11/29/2021  History of Present Illness  Pt is a 71 y.o. male with a history of EtOH abuse and alcoholic cirrhosis who presents for midepigastric abdominal pain, nausea, and diarrhea. Per pt, consumed nothing but alcohol for the five days prior to admission.  PMH includes GERD, depression PAF not on AC, ETOH abuse c frequent admissions, Chronic R spastic hemiplegia due to h/o stroke.   Clinical Impression  Pt was pleasant and motivated to participate during the session and put forth good effort throughout. Pt required physical assistance with all functional tasks and required frequent assistance to prevent posterior LOB while in standing. Pt was only able to take a maximum of 4-5 very small, effortful, shuffling steps before needing assistance returning to sitting.  Pt presents with a significant decline in functional mobility and balance compared to his baseline and would not be safe to return to his prior living situation at this time.  Pt will benefit from PT services in a SNF setting upon discharge to safely address deficits listed in patient problem list for decreased caregiver assistance and eventual return to PLOF.         Recommendations for follow up therapy are one component of a multi-disciplinary discharge planning process, led by the attending physician.  Recommendations may be updated based on patient status, additional functional criteria and insurance authorization.  Follow Up Recommendations Skilled nursing-short term rehab (<3 hours/day)    Assistance Recommended at Discharge Frequent or constant Supervision/Assistance  Patient can return home with the following  Two people to help with walking and/or transfers;Two people to help with bathing/dressing/bathroom;Assistance with cooking/housework;Assistance with feeding;Direct supervision/assist for medications  management;Direct supervision/assist for financial management;Assist for transportation;Help with stairs or ramp for entrance    Equipment Recommendations None recommended by PT  Recommendations for Other Services       Functional Status Assessment Patient has had a recent decline in their functional status and demonstrates the ability to make significant improvements in function in a reasonable and predictable amount of time.     Precautions / Restrictions Restrictions Weight Bearing Restrictions: No Other Position/Activity Restrictions: CIWA protocol      Mobility  Bed Mobility Overal bed mobility: Needs Assistance Bed Mobility: Supine to Sit, Sit to Supine     Supine to sit: Min assist Sit to supine: Max assist   General bed mobility comments: Assist for BLE and trunk control    Transfers Overall transfer level: Needs assistance Equipment used: Straight cane Transfers: Sit to/from Stand Sit to Stand: From elevated surface, Min assist           General transfer comment: Min A to stand and to prevent posterior LOB upon initial stand    Ambulation/Gait Ambulation/Gait assistance: Mod assist Gait Distance (Feet): 3 Feet Assistive device: Straight cane Gait Pattern/deviations: Step-to pattern Gait velocity: decreased     General Gait Details: Pt required almost constant mod A to prevent posterior LOB while ambulating with very slow cadence and short, effortful steps  Stairs            Wheelchair Mobility    Modified Rankin (Stroke Patients Only)       Balance Overall balance assessment: Needs assistance   Sitting balance-Leahy Scale: Fair     Standing balance support: Single extremity supported, During functional activity Standing balance-Leahy Scale: Poor Standing balance comment: frequent physical assist to prevent posterior LOB with standing activities  Pertinent Vitals/Pain Pain Assessment Pain  Assessment: No/denies pain    Home Living Family/patient expects to be discharged to:: Private residence Living Arrangements: Alone Available Help at Discharge: Personal care attendant;Available PRN/intermittently Type of Home: House Home Access: Stairs to enter Entrance Stairs-Rails: Psychiatric nurse of Steps: 3   Home Layout: One level Home Equipment: Cane - single Barista (2 wheels);Cane - quad;Grab bars - tub/shower;Grab bars - toilet;Shower seat;Tub bench      Prior Function               Mobility Comments: Mod Ind amb limited community distances with a SPC with occasional no AD in the home, no falls in the last 6 months ADLs Comments: Ind with bathing and dressing, PCA assists with housework     Hand Dominance        Extremity/Trunk Assessment   Upper Extremity Assessment Upper Extremity Assessment: Generalized weakness;RUE deficits/detail RUE Deficits / Details: h/o RUE contracture/weakness from CVA    Lower Extremity Assessment Lower Extremity Assessment: Generalized weakness       Communication   Communication: No difficulties  Cognition Arousal/Alertness: Awake/alert Behavior During Therapy: WFL for tasks assessed/performed Overall Cognitive Status: Within Functional Limits for tasks assessed                                          General Comments      Exercises Other Exercises Other Exercises: Anterior weight shifting activities in standing   Assessment/Plan    PT Assessment Patient needs continued PT services  PT Problem List Decreased strength;Decreased activity tolerance;Decreased balance;Decreased mobility;Decreased knowledge of use of DME       PT Treatment Interventions DME instruction;Gait training;Stair training;Functional mobility training;Therapeutic activities;Balance training;Therapeutic exercise;Patient/family education    PT Goals (Current goals can be found in the Care Plan  section)  Acute Rehab PT Goals Patient Stated Goal: To walk better PT Goal Formulation: With patient Time For Goal Achievement: 12/12/21 Potential to Achieve Goals: Good    Frequency Min 2X/week     Co-evaluation               AM-PAC PT "6 Clicks" Mobility  Outcome Measure Help needed turning from your back to your side while in a flat bed without using bedrails?: A Little Help needed moving from lying on your back to sitting on the side of a flat bed without using bedrails?: A Lot Help needed moving to and from a bed to a chair (including a wheelchair)?: A Lot Help needed standing up from a chair using your arms (e.g., wheelchair or bedside chair)?: A Lot Help needed to walk in hospital room?: Total Help needed climbing 3-5 steps with a railing? : Total 6 Click Score: 11    End of Session Equipment Utilized During Treatment: Gait belt Activity Tolerance: Patient tolerated treatment well Patient left: in bed;with call bell/phone within reach;with bed alarm set Nurse Communication: Mobility status PT Visit Diagnosis: Unsteadiness on feet (R26.81);Difficulty in walking, not elsewhere classified (R26.2);Muscle weakness (generalized) (M62.81)    Time: 7124-5809 PT Time Calculation (min) (ACUTE ONLY): 39 min   Charges:   PT Evaluation $PT Eval Moderate Complexity: 1 Mod PT Treatments $Therapeutic Activity: 8-22 mins       D. Royetta Asal PT, DPT 11/29/21, 5:30 PM

## 2021-11-29 NOTE — ED Notes (Signed)
Pt had small BM. Peri care completed. New briefs placed on pt. Pt requested ativan then less than 1-2 minutes later pt asleep.

## 2021-11-29 NOTE — ED Notes (Signed)
Pt resting at this time. Equal rise and fall of chest. Breathing is unlabored.

## 2021-11-29 NOTE — ED Notes (Deleted)
Pt assisted to use restroom by Su Grand.

## 2021-11-29 NOTE — ED Provider Notes (Signed)
Vitals:   11/29/21 0400 11/29/21 0430  BP: 135/88 134/88  Pulse: (!) 56 (!) 57  Resp:    Temp:    SpO2: 97% 97%     No acute events overnight.  Patient remains in stable condition, awaiting social work placement.   Rada Hay, MD 11/29/21 [phone]

## 2021-11-29 NOTE — TOC Progression Note (Signed)
Transition of Care (TOC) - Progression Note    Patient Details  Name: Douglas Peterson. MRN: [id number] Date of Birth: [birth year]  Transition of Care [facility]) CM/SW Contact  Shelbie Hutching, RN Phone Number: 11/29/2021, 9:57 AM  Clinical Narrative:    [facility] here to evaluate and interview patient.     Expected Discharge Plan: Assisted Living Barriers to Discharge: Continued Medical Work up, Unsafe home situation  Expected Discharge Plan and Services Expected Discharge Plan: Assisted Living   Discharge Planning Services: CM Consult   Living arrangements for the past 2 months: Single Family Home                 DME Arranged: N/A DME Agency: NA       HH Arranged: NA HH Agency: NA         Social Determinants of Health (SDOH) Interventions    Readmission Risk Interventions Readmission Risk Prevention Plan 08/23/2020 08/19/2020 08/14/2020  Transportation Screening Complete Complete Complete  PCP or Specialist Appt within 3-5 Days - - -  HRI or [place] - - -  Palliative Care Screening - - -  Medication Review (RN Care Manager) Complete Complete -  PCP or Specialist appointment within 3-5 days of discharge (No Data) Complete Complete  HRI or Home Care Consult Complete - Complete  SW Recovery Care/Counseling Consult - Complete Complete  Palliative Care Screening - Not Applicable Not [place] Not Applicable - Patient Refused  Some recent data might be hidden

## 2021-11-29 NOTE — ED Notes (Signed)
Pts bedding changed, a new brief and gown put on.

## 2021-11-29 NOTE — ED Notes (Addendum)
Crystal RN responded to bed alarm at this time. Pt had slid down in the bed and asking why he was in the hospital. Explained that he was here because he called EMS for diarrhea. Pt repositioned in bed at this time. Breakfast placed in the room   Pt immediately went back to sleep when repositioned.

## 2021-11-29 NOTE — ED Notes (Signed)
CM and representative from [place] at pt bedside.

## 2021-11-29 NOTE — ED Notes (Signed)
Pt eating from lunch tray.  

## 2021-11-29 NOTE — ED Notes (Signed)
Outpatient Social Worker at bedside.

## 2021-11-29 NOTE — ED Notes (Signed)
Asked pt if he needed to use urinal or bedpan; pt denied. After a sip of water with meds pt then stated he had to urinate so was assisted with urinal. Stretcher locked low. Rails up. Call bell within reach.

## 2021-11-29 NOTE — ED Notes (Signed)
Pt assisted onto bedpan by other RN.

## 2021-11-29 NOTE — ED Notes (Signed)
Pt resting at this time. NAD. Respirations even and unlabored.

## 2021-11-30 NOTE — ED Notes (Signed)
Visitor at bedside.

## 2021-11-30 NOTE — ED Notes (Signed)
Pt sitting in recliner, NAD

## 2021-11-30 NOTE — ED Provider Notes (Signed)
----------------------------------------- °  5:14 AM on 11/30/2021 -----------------------------------------   Blood pressure 123/86, pulse (!) 57, temperature 98.2 F (36.8 C), temperature source Oral, resp. rate 20, height 5' 3"  (1.6 m), weight 54.4 kg, SpO2 98 %.  The patient is calm and cooperative at this time.  There have been no acute events since the last update.  Awaiting disposition plan from social worker.   Paulette Blanch, MD 11/30/21 [phone]

## 2021-11-30 NOTE — ED Notes (Signed)
PT at bedside.

## 2021-11-30 NOTE — Progress Notes (Signed)
OT Cancellation Note  Patient Details Name: Douglas Peterson. MRN: [id number] DOB: July 29, 1951   Cancelled Treatment:    Reason Eval/Treat Not Completed: Patient at procedure or test/ unavailable PTA assisting pt back to bed at time of OT Attempt. Will f/u next available date/time for OT evaluation. Thank you.  Gerrianne Scale, [facility], OTR/L ascom [phone] 11/30/21, 4:37 PM

## 2021-11-30 NOTE — ED Notes (Signed)
Shift Summary 2300-0700  Patient slept most of the night. CIWA score of 3. No PRN Ativan given this shift. Skin p/w/d. RR even and nonlabored.

## 2021-11-30 NOTE — Progress Notes (Addendum)
Physical Therapy Treatment Patient Details Name: Douglas Peterson. MRN: [id number] DOB: Aug 12, 1951 Today's Date: 11/30/2021   History of Present Illness Pt is a 71 y.o. male with a history of EtOH abuse and alcoholic cirrhosis who presents for midepigastric abdominal pain, nausea, and diarrhea. Per pt, consumed nothing but alcohol for the five days prior to admission.  PMH includes GERD, depression PAF not on AC, ETOH abuse c frequent admissions, Chronic R spastic hemiplegia due to h/o stroke.    PT Comments    Pt was asleep with untouched breakfast tray at bedside. He easily awake and is agreeable/motivated for session. Lengthy discussion about PLOF between last admission and current admission. Pt is well known by author from past admissions. Per pt, "I spent 6 months after last admission in group home prior to returning home. My brothers girlfriend was helping me at home and I was looking into hiring home health aides through a company. It was too expensive." Pt was able to exit R side of bed with assistance + increased time. Due to ED bed heights (tall), was able to stand to hemi-walker with min assist only. From lower recliner/toilet height surface, pt required mod-max assist with vcs for fwd wt shift and overall improve technique. Slight posterior push with Sit to stand and stand to sit. Pt does not like using hemi walker however due to balance deficits, [place] Curia feels is much safer versus SPC. He was repositioned in recliner post session with breakfast tray set up for him. Pt will need extensive PT going forward to address deficits while maximizing independence with all ADLs.    Recommendations for follow up therapy are one component of a multi-disciplinary discharge planning process, led by the attending physician.  Recommendations may be updated based on patient status, additional functional criteria and insurance authorization.  Follow Up Recommendations  Skilled nursing-short term rehab (<3  hours/day)     Assistance Recommended at Discharge Frequent or constant Supervision/Assistance  Patient can return home with the following Two people to help with walking and/or transfers;Two people to help with bathing/dressing/bathroom;Assistance with cooking/housework;Assistance with feeding;Direct supervision/assist for medications management;Direct supervision/assist for financial management;Assist for transportation;Help with stairs or ramp for entrance   Equipment Recommendations  None recommended by PT       Precautions / Restrictions Restrictions Weight Bearing Restrictions: No     Mobility  Bed Mobility Overal bed mobility: Needs Assistance Bed Mobility: Supine to Sit, Sit to Supine   Supine to sit: Min assist General bed mobility comments: Pt was able to exit R side of bed.    Transfers Overall transfer level: Needs assistance Equipment used: Hemi-walker Transfers: Sit to/from Stand Sit to Stand: From elevated surface, Min assist, mod-max assist from lower surface heights       General transfer comment: Pt was able to stand from EOB to hemi-walker with min assist.    Ambulation/Gait Ambulation/Gait assistance: Min assist, Mod assist Gait Distance (Feet): 8 Feet, 15 ft x2  Assistive device: Hemi-walker ((left in room)) pt ambulated to toilet from recliner ~ 15 ft with constant assistance to prevent posterior LOB Gait Pattern/deviations: Step-to pattern Gait velocity: decreased     General Gait Details: pt is extremely unsteady on his feet. Required min assist for safety however had 1 occasion of LOB with mod assist for intervention to prevent fall.    Balance Overall balance assessment: Needs assistance Sitting-balance support: Single extremity supported Sitting balance-Leahy Scale: Fair     Standing balance support: Single extremity supported,  During functional activity Standing balance-Leahy Scale: Poor      Cognition Arousal/Alertness: Awake/alert,  Lethargic Behavior During Therapy: WFL for tasks assessed/performed Overall Cognitive Status: Within Functional Limits for tasks assessed      General Comments: Pt is A and O x 4               Pertinent Vitals/Pain Pain Assessment Pain Assessment: No/denies pain     PT Goals (current goals can now be found in the care plan section) Acute Rehab PT Goals Patient Stated Goal: To walk better Progress towards PT goals: Progressing toward goals    Frequency    Min 2X/week      PT Plan Current plan remains appropriate       AM-PAC PT "6 Clicks" Mobility   Outcome Measure  Help needed turning from your back to your side while in a flat bed without using bedrails?: A Little Help needed moving from lying on your back to sitting on the side of a flat bed without using bedrails?: A Lot Help needed moving to and from a bed to a chair (including a wheelchair)?: A Lot Help needed standing up from a chair using your arms (e.g., wheelchair or bedside chair)?: A Lot Help needed to walk in hospital room?: Total Help needed climbing 3-5 steps with a railing? : Total 6 Click Score: 11    End of Session Equipment Utilized During Treatment: Gait belt Activity Tolerance: Patient tolerated treatment well Patient left: in chair;with call bell/phone within reach;with chair alarm set Nurse Communication: Mobility status PT Visit Diagnosis: Unsteadiness on feet (R26.81);Difficulty in walking, not elsewhere classified (R26.2);Muscle weakness (generalized) (M62.81)     Time: 1002-1050 PT Time Calculation (min) (ACUTE ONLY): 48 min  Charges:  $Gait Training: 8-22 mins $Therapeutic Activity: 23-37 mins                    Douglas Peterson PTA 11/30/21, 11:26 AM

## 2021-11-30 NOTE — ED Notes (Signed)
PT came and assisted pt with toileting

## 2021-11-30 NOTE — ED Notes (Signed)
Awake and alert. Orientated to self, place, the current month but not year or situation. Denies pain. Given partial bath, gown and bed linens changed. Purewick placed.

## 2021-12-01 NOTE — ED Notes (Signed)
Family finished at [facility], leaving.

## 2021-12-01 NOTE — ED Notes (Signed)
Patient called this nurse to the bedside and stated that he wants to be discharged immediately. The patient had someone on the phone with him. This nurse explained that he could not be discharged at this time due to safety issues. He has had multiple falls at home to which he denies having any falls in over a year. I told him that if he had a direct blood relative that wanted to come in and speak with the provider about taking custody, then I am sure he would explain why we could not do that. Patient threatened to call the police on me for holding him hostage. I offered to have one of the officers that were down the hall come speak with him. He denies seeing any doctors while he has been here despite there being multiple documentation to the contrary. Patient continued to threaten me and yell at me.

## 2021-12-01 NOTE — ED Notes (Signed)
Report received from [place], [place]. Patient was sitting comfortably in recliner but requested to be moved back into his bed. This was done with assistance of quad cane and then his Premofit was reconnected to the suction on the wall. Patient verbalized no other needs or complaints.

## 2021-12-01 NOTE — ED Provider Notes (Signed)
Today's Vitals   11/30/21 2115 11/30/21 2130 11/30/21 2204 11/30/21 2252  BP:  112/71 138/82 131/85  Pulse: 64 (!) 58 64 (!) 56  Resp:    17  Temp:      TempSrc:      SpO2: 99% 98%  100%  Weight:      Height:      PainSc:       Body mass index is 21.24 kg/m.   [name] resting comfortably.  No acute events overnight.  No signs of ETOH withdrawal.  HD stable.  Awaiting social work disposition.   , Delice Bison, DO 12/01/21 [phone]

## 2021-12-01 NOTE — ED Notes (Signed)
Lunch eaten. Sitting in recliner. Pt looking through his personal gym bag. Requesting a w/c that he can roll around in.

## 2021-12-01 NOTE — NC FL2 (Signed)
[place] LEVEL OF CARE SCREENING TOOL     IDENTIFICATION  Patient Name: Douglas Peterson. Birthdate: April 04, 1951 Sex: male Admission Date (Current Location): 11/27/2021  [facility] and [place] Number:  Engineering geologist and Address:  [facility], [address], [place], [place] [address]      Provider Number: [id number]  Attending Physician Name and Address:  No att. providers found  Relative Name and Phone Number:  Toula Moos (Legal Guardian)   [phone] (Mobile)    Current Level of Care: Hospital Recommended Level of Care: [place] Prior Approval Number:    Date Approved/Denied:   PASRR Number: [id number]  Discharge Plan: SNF    Current Diagnoses: Patient Active Problem List   Diagnosis Date Noted   Dysphonia 08/13/2021   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   Paresthesia of right upper extremity 08/13/2021   SIRS (systemic inflammatory response syndrome) ([place]) 08/05/2020   GERD (gastroesophageal reflux disease) 06/05/2020   Alcohol use disorder, severe, dependence ([place]) 05/28/2020   Paroxysmal A-fib ([name]) 03/17/2020   Severe protein-calorie malnutrition ([place]) 02/25/2020   Primary insomnia 02/06/2020   Left inguinal hernia 01/31/2020   Encounter for competency evaluation    Pancytopenia ([place])    Alcoholic cirrhosis of liver without ascites ([place])    Thrombocytopenia ([place])    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[place]) 09/27/2019   Alcohol withdrawal delirium, acute, hyperactive ([place]) 09/21/2019   Pressure injury of ankle, stage 1 08/15/2019   Palliative care by specialist    Alcohol withdrawal ([place]) 03/20/2019   Closed fracture of right proximal humerus 03/19/2019   Vitamin D deficiency 01/11/2019   Osteoporosis 12/22/2018   Closed nondisplaced fracture of acromial process of right scapula with routine healing 11/15/2018   Compression fracture of L2 vertebra with routine healing  11/15/2018   Delirium tremens ([place]) 03/08/2018   HTN (hypertension) 09/16/2017   Encephalopathy, 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) 05/22/2014    Orientation RESPIRATION BLADDER Height & Weight     Self, Time, Situation, Place  Normal Continent Weight: 119 lb 14.9 oz (54.4 kg) Height:  5' 3"  (160 cm)  BEHAVIORAL SYMPTOMS/MOOD NEUROLOGICAL BOWEL NUTRITION STATUS      Continent Diet  AMBULATORY STATUS COMMUNICATION OF NEEDS Skin   Extensive Assist Verbally Normal PU Stage 1 Dressing: Daily (stage 1 cocyx)                     Personal Care Assistance Level of Assistance  Bathing, Feeding, Dressing, Total care Bathing Assistance: Maximum assistance Feeding assistance: Limited assistance Dressing Assistance: Limited assistance Total Care Assistance: Maximum assistance   Functional Limitations Info  Sight, Hearing, Speech Sight Info: Adequate Hearing Info: Impaired Speech Info: Adequate    SPECIAL CARE FACTORS FREQUENCY  PT (By licensed PT), OT (By licensed OT)     PT Frequency: 5X per week OT Frequency: 5X per week            Contractures Contractures Info: Present (Left hand contracture)    Additional Factors Info  Code Status, Allergies Code Status Info: Full Allergies Info: Hydrochlorothiazide           Current Medications (12/01/2021):  This is the current hospital active medication list Current Facility-Administered Medications  Medication  Dose Route Frequency Provider Last Rate Last Admin   ascorbic acid (VITAMIN C) tablet 1,000 mg  1,000 mg Oral Daily Delman Kitten, MD   1,000 mg at 12/01/21 1002   aspirin EC tablet 81 mg  81 mg Oral Daily Delman Kitten, MD   81 mg at 12/01/21 1002   atenolol (TENORMIN) tablet 25  mg  25 mg Oral BID Delman Kitten, MD   25 mg at 12/01/21 1001   atorvastatin (LIPITOR) tablet 10 mg  10 mg Oral Daily Delman Kitten, MD   10 mg at 12/01/21 1000   baclofen (LIORESAL) tablet 10 mg  10 mg Oral TID Delman Kitten, MD   10 mg at 12/01/21 1001   feeding supplement (ENSURE ENLIVE / ENSURE PLUS) liquid 237 mL  237 mL Oral BID BM Delman Kitten, MD       folic acid (FOLVITE) tablet 1 mg  1 mg Oral Daily Naaman Plummer, MD   1 mg at 12/01/21 1001   hydrOXYzine (ATARAX) tablet 25 mg  25 mg Oral TID PRN Delman Kitten, MD   25 mg at 12/01/21 0548   multivitamin with minerals tablet 1 tablet  1 tablet Oral Daily Naaman Plummer, MD   1 tablet at 12/01/21 1001   thiamine tablet 100 mg  100 mg Oral Daily Naaman Plummer, MD   100 mg at 12/01/21 1001   Or   thiamine (B-1) injection 100 mg  100 mg Intravenous Daily Naaman Plummer, MD       Current Outpatient Medications  Medication Sig Dispense Refill   hydrOXYzine (ATARAX/VISTARIL) 25 MG tablet Take 1 tablet (25 mg total) by mouth 3 (three) times daily as needed for anxiety or itching. 30 tablet 0   acetaminophen (TYLENOL) 500 MG tablet Take 1 tablet (500 mg total) by mouth every 8 (eight) hours as needed for mild pain or fever. 15 tablet 0   Ascorbic Acid (VITAMIN C) 1000 MG tablet Take 1 tablet (1,000 mg total) by mouth daily. 30 tablet 0   aspirin EC 81 MG tablet Take 1 tablet (81 mg total) by mouth daily. Swallow whole. 30 tablet 0   atenolol (TENORMIN) 25 MG tablet Take 1 tablet (25 mg total) by mouth 2 (two) times daily. 30 tablet 0   atorvastatin (LIPITOR) 10 MG tablet Take 1 tablet (10 mg total) by mouth daily. 30 tablet 0   baclofen (LIORESAL) 10 MG tablet Take 1 tablet (10 mg total) by mouth 3 (three) times daily. 30 each 0   Calcium Carb-Cholecalciferol (OYSTER SHELL CALCIUM W/D) 500-200 MG-UNIT TABS Take 1 tablet by mouth daily as needed. 30 tablet 0   Cholecalciferol 125 MCG (5000 UT) TABS Take 1 tablet (5,000 Units total) by mouth daily. 30  tablet 0   doxepin (SINEQUAN) 10 MG capsule Take 1 capsule (10 mg total) by mouth at bedtime as needed (sleep). 30 capsule 0   feeding supplement (ENSURE ENLIVE / ENSURE PLUS) LIQD Take 237 mLs by mouth 2 (two) times daily between meals. 786 mL 12   folic acid (FOLVITE) 1 MG tablet Take 1 tablet (1 mg total) by mouth daily. 30 tablet 0   Multiple Vitamin (MULTIVITAMIN WITH MINERALS) TABS tablet Take 1 tablet by mouth daily. 30 tablet 0   PARoxetine (PAXIL) 30 MG tablet Take 1 tablet (30 mg total) by mouth daily. 30 tablet 0   polyethylene glycol powder (GLYCOLAX/MIRALAX) 17 GM/SCOOP powder Take 17 g by mouth daily. 255 g 0  senna-docusate (SENOKOT-S) 8.6-50 MG tablet Take 2 tablets by mouth at bedtime. 30 tablet 1   Sod Picosulfate-Mag Ox-Cit Acd (CLENPIQ) 10-3.5-12 MG-GM -GM/160ML SOLN Take 320 mLs by mouth as directed. (Patient not taking: Reported on 11/28/2021) 320 mL 0   thiamine 100 MG tablet Take 1 tablet (100 mg total) by mouth daily. 30 tablet 0     Discharge Medications: Please see discharge summary for a list of discharge medications.  Relevant Imaging Results:  Relevant Lab Results:   Additional Information SS# [id number]  Adelene Amas, LCSWA

## 2021-12-01 NOTE — ED Notes (Signed)
Family at BS

## 2021-12-01 NOTE — TOC Progression Note (Signed)
Transition of Care (TOC) - Progression Note    Patient Details  Name: [name]. MRN: [id number] Date of Birth: 19-Jan-1951  Transition of Care [facility]) CM/SW [place], [place] Phone Number: 12/01/2021, 3:32 PM  Clinical Narrative:     CSW spoke with the patient's guardian Toula Moos (Legal Guardian)  [phone] (Mobile), with update on PT/OT recommendations for SNF.  Ms. Orland Mustard stated she would like for the patient to go to SNF before going to ALF placement.  Ms. Orland Mustard stated the patient has placement offers at [facility] and [facility].  CSW emailed PT/OT notes to Ms. Morrow.  Patient has court date on Tuesday 12/03/2021.  CSW started SNF search. No [place].  Expected Discharge Plan: Assisted Living Barriers to Discharge: Continued Medical Work up, Unsafe home situation  Expected Discharge Plan and Services Expected Discharge Plan: Assisted Living   Discharge Planning Services: CM Consult   Living arrangements for the past 2 months: Single Family Home                 DME Arranged: N/A DME Agency: NA       HH Arranged: NA HH Agency: NA         Social Determinants of Health (SDOH) Interventions    Readmission Risk Interventions Readmission Risk Prevention Plan 08/23/2020 08/19/2020 08/14/2020  Transportation Screening Complete Complete Complete  PCP or Specialist Appt within 3-5 Days - - -  HRI or [place] - - -  Palliative Care Screening - - -  Medication Review (RN Care Manager) Complete Complete -  PCP or Specialist appointment within 3-5 days of discharge (No Data) Complete Complete  HRI or Home Care Consult Complete - Complete  SW Recovery Care/Counseling Consult - Complete Complete  Palliative Care Screening - Not Applicable Not [place] Not Applicable - Patient Refused  Some recent data might be hidden

## 2021-12-01 NOTE — ED Notes (Signed)
Two of the patient's family members showed up at the emergency room asking to speak to the doctor. Dr. Charna Archer went to speak with them and the family members. They stated that the patient called and stated that he was being discharged and needed a ride. The doctor told them no that he was still waiting for placement. They said ok and left. The patient was still on the phone fussing as to why he was not being discharged.

## 2021-12-01 NOTE — ED Notes (Signed)
Patient requesting something to calm his nerves. He states he feels like his insides are just vibrating and he is just nervous. Patient will be medicated per orders.

## 2021-12-01 NOTE — ED Notes (Signed)
Patient called out again and stated that his brother is on the way to speak with the provider about getting him discharged.

## 2021-12-01 NOTE — Evaluation (Signed)
Occupational Therapy Evaluation Patient Details Name: Douglas Peterson. MRN: [id number] DOB: [birth year] Today's Date: 12/01/2021   History of Present Illness Pt is a 71 y.o. male with a history of EtOH abuse and alcoholic cirrhosis who presents for midepigastric abdominal pain, nausea, and diarrhea. Per pt, consumed nothing but alcohol for the five days prior to admission.  PMH includes GERD, depression PAF not on AC, ETOH abuse c frequent admissions, Chronic R spastic hemiplegia due to h/o stroke.   Clinical Impression   Pt seen for OT evaluation this date. Prior to admission, pt was living alone in a 1-level home, using a SPC for functional mobility, performing dressing and bathing independently, and receiving assistance from brother's girlfriend for IADLs. Pt currently presents with decreased strength, balance, and activity tolerance. Due to these functional impairments, pt requires MIN A for bed mobility, MIN A for seated UB/LB dressing, and MIN A for functional mobility of short household distances (~106f) with hemi-walker. Pt would benefit from additional skilled OT services to maximize return to PLOF and minimize risk of future falls, injury, caregiver burden, and readmission. Upon discharge, recommend SNF.    Recommendations for follow up therapy are one component of a multi-disciplinary discharge planning process, led by the attending physician.  Recommendations may be updated based on patient status, additional functional criteria and insurance authorization.   Follow Up Recommendations  Skilled nursing-short term rehab (<3 hours/day)    Assistance Recommended at Discharge Frequent or constant Supervision/Assistance  Patient can return home with the following A little help with walking and/or transfers;A lot of help with bathing/dressing/bathroom    Functional Status Assessment  Patient has had a recent decline in their functional status and demonstrates the ability to make significant  improvements in function in a reasonable and predictable amount of time.  Equipment Recommendations  Other (comment) (defer to next venue of care)       Precautions / Restrictions Precautions Precautions: Fall Restrictions Weight Bearing Restrictions: No Other Position/Activity Restrictions: CIWA protocol      Mobility Bed Mobility Overal bed mobility: Needs Assistance Bed Mobility: Supine to Sit     Supine to sit: Min assist     General bed mobility comments: Requires MIN A for bring trunk upright    Transfers Overall transfer level: Needs assistance Equipment used: Hemi-walker Transfers: Sit to/from Stand Sit to Stand: From elevated surface, Min assist           General transfer comment: Pt was able to stand from EOB to hemi-walker with min assist.      Balance Overall balance assessment: Needs assistance Sitting-balance support: No upper extremity supported, Feet unsupported Sitting balance-Leahy Scale: Fair Sitting balance - Comments: MIN GUARD for dynamic sitting at EOB   Standing balance support: Single extremity supported, During functional activity Standing balance-Leahy Scale: Poor Standing balance comment: Requires MIN A to present posterior LOB during functional mobility                           ADL either performed or assessed with clinical judgement   ADL Overall ADL's : Needs assistance/impaired                 Upper Body Dressing : Minimal assistance;Sitting Upper Body Dressing Details (indicate cue type and reason): to don/doff hospital gown Lower Body Dressing: Minimal assistance;Sitting/lateral leans Lower Body Dressing Details (indicate cue type and reason): to don/doff socks while seated EOB. Requires MIN A to bring L  LE to knee and maintain in figure-4 position             Functional mobility during ADLs: Minimal assistance (hemi-walker)       Vision Patient Visual Report: No change from baseline               Pertinent Vitals/Pain Pain Assessment Pain Assessment: No/denies pain        Extremity/Trunk Assessment Upper Extremity Assessment Upper Extremity Assessment: Generalized weakness;RUE deficits/detail RUE Deficits / Details: h/o RUE contracture/weakness from CVA   Lower Extremity Assessment Lower Extremity Assessment: Generalized weakness       Communication Communication Communication: No difficulties   Cognition Arousal/Alertness: Awake/alert Behavior During Therapy: WFL for tasks assessed/performed Overall Cognitive Status: Within Functional Limits for tasks assessed                                                  Home Living Family/patient expects to be discharged to:: Private residence Living Arrangements: Alone Available Help at Discharge: Family;Available PRN/intermittently (Brother's girlfriend) Type of Home: House Home Access: Stairs to enter [employer] of Steps: 3 Entrance Stairs-Rails: Right;Left Home Layout: One level     Bathroom Shower/Tub: Teacher, early years/pre: Standard     Home Equipment: Cane - single Barista (2 wheels);Cane - quad;Grab bars - tub/shower;Grab bars - toilet;Shower seat;Tub bench          Prior Functioning/Environment Prior Level of Function : Independent/Modified Independent             Mobility Comments: Mod Ind amb limited community distances with a SPC with occasional no AD in the home, no falls in the last 6 months ADLs Comments: Ind with bathing and dressing, brother's girlfriend assists with IADLs        OT Problem List: Decreased strength;Impaired balance (sitting and/or standing)      OT Treatment/Interventions: Self-care/ADL training;Therapeutic exercise;DME and/or AE instruction;Therapeutic activities;Patient/family education;Balance training    OT Goals(Current goals can be found in the care plan section) Acute Rehab OT Goals Patient Stated Goal:  to get stronger OT Goal Formulation: With patient Time For Goal Achievement: 12/15/21 Potential to Achieve Goals: Good ADL Goals Pt Will Perform Lower Body Dressing: with supervision;with set-up;sitting/lateral leans Pt Will Transfer to Toilet: with supervision;ambulating;regular height toilet Pt Will Perform Toileting - Clothing Manipulation and hygiene: with supervision;with set-up;sitting/lateral leans  OT Frequency: Min 2X/week       AM-PAC OT "6 Clicks" Daily Activity     Outcome Measure Help from another person eating meals?: None Help from another person taking care of personal grooming?: A Little Help from another person toileting, which includes using toliet, bedpan, or urinal?: A Lot Help from another person bathing (including washing, rinsing, drying)?: A Lot Help from another person to put on and taking off regular upper body clothing?: A Little Help from another person to put on and taking off regular lower body clothing?: A Lot 6 Click Score: 16   End of Session Equipment Utilized During Treatment: Other (comment) (hemi walker) Nurse Communication: Mobility status  Activity Tolerance: Patient tolerated treatment well Patient left: in chair;with call bell/phone within reach;with chair alarm set  OT Visit Diagnosis: Unsteadiness on feet (R26.81);Muscle weakness (generalized) (M62.81)                Time: 0867-6195 OT Time Calculation (  min): 27 min Charges:  OT General Charges $OT Visit: 1 Visit OT Evaluation $OT Eval Moderate Complexity: 1 Mod OT Treatments $Self Care/Home Management : 8-22 mins  Fredirick Maudlin, OTR/L [place]

## 2021-12-01 NOTE — ED Notes (Signed)
PT finished with pt, pt up to recliner. Speaking on phone. Meds given.

## 2021-12-01 NOTE — ED Notes (Signed)
Pt alert, NAD, calm, interactive, respe e/u, denies sx or complaints, breakfast eaten 100%.

## 2021-12-02 LAB — RESP PANEL BY RT-PCR (FLU A&B, COVID) ARPGX2
Influenza A by PCR: NEGATIVE
Influenza B by PCR: NEGATIVE
SARS Coronavirus 2 by RT PCR: NEGATIVE

## 2021-12-02 MED ORDER — ONDANSETRON 4 MG PO TBDP
4.0000 mg | ORAL_TABLET | Freq: Three times a day (TID) | ORAL | Status: DC | PRN
  Administered 2021-12-02: 4 mg via ORAL
  Filled 2021-12-02: qty 1

## 2021-12-02 NOTE — ED Notes (Signed)
SNF placement

## 2021-12-02 NOTE — ED Notes (Signed)
Patient resting comfortably on stretcher with eyes closed. RR even and unlabored. Patient verbalizes no needs or complaints at this time. Will continue to monitor patient.

## 2021-12-02 NOTE — Discharge Instructions (Signed)
Be sure to follow-up with the doctor at [facility].  Continue to take all your medicines.  Return for any problems.

## 2021-12-02 NOTE — ED Notes (Signed)
Attempting to call report to [facility] [phone] for room 608B. No answer, trying back in a few minutes.

## 2021-12-02 NOTE — Progress Notes (Signed)
Physical Therapy Treatment Patient Details Name: Douglas Peterson. MRN: [id number] DOB: 1951-06-03 Today's Date: 12/02/2021   History of Present Illness Pt is a 71 y.o. male with a history of EtOH abuse and alcoholic cirrhosis who presents for midepigastric abdominal pain, nausea, and diarrhea. Per pt, consumed nothing but alcohol for the five days prior to admission.  PMH includes GERD, depression PAF not on AC, ETOH abuse c frequent admissions, Chronic R spastic hemiplegia due to h/o stroke.    PT Comments    Pt was pleasant and motivated to participate during the session and put forth good effort throughout. Pt continued to require physical assistance with functional tasks but grossly less than during previous sessions. Pt was able to amb 30' with a SPC with overall improved stability but continued to require occasional assist to prevent LOB.  Pt remains at a very high risk for falls and would not be safe to return to his prior living situation at this time.  Pt will benefit from PT services in a SNF setting upon discharge to safely address deficits listed in patient problem list for decreased caregiver assistance and eventual return to PLOF.     Recommendations for follow up therapy are one component of a multi-disciplinary discharge planning process, led by the attending physician.  Recommendations may be updated based on patient status, additional functional criteria and insurance authorization.  Follow Up Recommendations  Skilled nursing-short term rehab (<3 hours/day)     Assistance Recommended at Discharge Frequent or constant Supervision/Assistance  Patient can return home with the following Two people to help with walking and/or transfers;Two people to help with bathing/dressing/bathroom;Assistance with cooking/housework;Assistance with feeding;Direct supervision/assist for medications management;Direct supervision/assist for financial management;Assist for transportation;Help with  stairs or ramp for entrance   Equipment Recommendations  None recommended by PT    Recommendations for Other Services       Precautions / Restrictions Precautions Precautions: Fall Restrictions Weight Bearing Restrictions: No Other Position/Activity Restrictions: CIWA protocol     Mobility  Bed Mobility Overal bed mobility: Needs Assistance Bed Mobility: Supine to Sit     Supine to sit: Min assist     General bed mobility comments: Requires Min A to bring trunk upright    Transfers Overall transfer level: Needs assistance Equipment used: Straight cane Transfers: Sit to/from Stand Sit to Stand: Min assist           General transfer comment: Min A for stability upon coming to full upright standing position    Ambulation/Gait Ambulation/Gait assistance: Min assist, Mod assist Gait Distance (Feet): 30 Feet Assistive device: Straight cane Gait Pattern/deviations: Step-to pattern Gait velocity: decreased     General Gait Details: Pt required min A on two occasions to prevent posterior LOB   Stairs             Wheelchair Mobility    Modified Rankin (Stroke Patients Only)       Balance Overall balance assessment: Needs assistance Sitting-balance support: No upper extremity supported, Feet unsupported Sitting balance-Leahy Scale: Fair     Standing balance support: Single extremity supported, During functional activity Standing balance-Leahy Scale: Poor Standing balance comment: Requires assist to present posterior LOB during functional mobility                            Cognition Arousal/Alertness: Awake/alert Behavior During Therapy: WFL for tasks assessed/performed Overall Cognitive Status: Within Functional Limits for tasks assessed  Exercises Other Exercises Other Exercises: Anterior weight shifting activities in standing    General Comments        Pertinent  Vitals/Pain Pain Assessment Pain Assessment: No/denies pain    Home Living                          Prior Function            PT Goals (current goals can now be found in the care plan section) Progress towards PT goals: Progressing toward goals    Frequency    Min 2X/week      PT Plan Current plan remains appropriate    Co-evaluation              AM-PAC PT "6 Clicks" Mobility   Outcome Measure  Help needed turning from your back to your side while in a flat bed without using bedrails?: A Little Help needed moving from lying on your back to sitting on the side of a flat bed without using bedrails?: A Little Help needed moving to and from a bed to a chair (including a wheelchair)?: A Lot Help needed standing up from a chair using your arms (e.g., wheelchair or bedside chair)?: A Lot Help needed to walk in hospital room?: A Lot Help needed climbing 3-5 steps with a railing? : Total 6 Click Score: 13    End of Session Equipment Utilized During Treatment: Gait belt Activity Tolerance: Patient tolerated treatment well Patient left: in chair;with call bell/phone within reach;with chair alarm set Nurse Communication: Mobility status PT Visit Diagnosis: Unsteadiness on feet (R26.81);Difficulty in walking, not elsewhere classified (R26.2);Muscle weakness (generalized) (M62.81)     Time: 4259-5638 PT Time Calculation (min) (ACUTE ONLY): 31 min  Charges:  $Gait Training: 8-22 mins $Therapeutic Activity: 8-22 mins                     D. Scott  PT, DPT 12/02/21, 3:42 PM

## 2021-12-02 NOTE — ED Notes (Signed)
POA was called by provider. EMS transport was put in and report was given to RN at [employer].

## 2021-12-02 NOTE — ED Notes (Signed)
Pt at bedside with pt.

## 2021-12-02 NOTE — TOC Transition Note (Signed)
Transition of Care [facility]) - CM/SW Discharge Note   Patient Details  Name: Douglas Peterson. MRN: [id number] Date of Birth: 19-Mar-1951  Transition of Care [facility]) CM/SW Contact:  Shelbie Hutching, RN Phone Number: 12/02/2021, 3:38 PM   Clinical Narrative:    Patient has been accepted by [facility] for short term rehab.  [name] has a Child psychotherapist for SNF so patient can go today.  Patient will be going to room 608 B- bedside RN will call report to [phone].  ED secretary will set up EMS transport.  Toula Moos with [place] DSS aware of placement and discharge today.     Final next level of care: Skilled Nursing Facility Barriers to Discharge: Barriers Resolved   Patient Goals and CMS Choice Patient states their goals for this hospitalization and ongoing recovery are:: Going to SNF for rehab before going to ALF CMS Medicare.gov Compare Post Acute Care list provided to:: Legal Guardian Choice offered to / list presented to : [facility] POA / [place]  Discharge Placement              Patient chooses bed at: [facility] [place] Patient to be transferred to facility by: [facility] Name of family member notified: Toula Moos with DSS Patient and family notified of of transfer: 12/02/21  Discharge Plan and Services   Discharge Planning Services: CM Consult            DME Arranged: N/A DME Agency: NA       HH Arranged: NA [place] Agency: NA        Social Determinants of Health (SDOH) Interventions     Readmission Risk Interventions Readmission Risk Prevention Plan 08/23/2020 08/19/2020 08/14/2020  Transportation Screening Complete Complete Complete  PCP or Specialist Appt within 3-5 Days - - -  HRI or [place] - - -  Palliative Care Screening - - -  Medication Review ([place]) Complete Complete -  PCP or Specialist appointment within 3-5 days of discharge (No Data) Complete Complete  HRI or Home Care Consult Complete - Complete  SW  Recovery Care/Counseling Consult - Complete Complete  Palliative Care Screening - Not Applicable Not [place] Not Applicable - Patient Refused  Some recent data might be hidden

## 2021-12-02 NOTE — ED Notes (Signed)
Pt ate breakfast tray. Pt has Ensure at bedside and more water was provided.

## 2021-12-02 NOTE — ED Notes (Signed)
Patient called this nurse into the room because his premofit had come loose and he had urinated in his brief which then leaked all in the bed. Patient was cleaned and new brief placed. New premofit applied once he returned to his cleaned bed. Patient's linens were changed and new chucks placed on bed. Patient verbalized no other needs or complaints at this time.

## 2022-02-14 ENCOUNTER — Emergency Department: Payer: Medicare HMO

## 2022-02-14 ENCOUNTER — Other Ambulatory Visit: Payer: Self-pay

## 2022-02-14 ENCOUNTER — Emergency Department
Admission: EM | Admit: 2022-02-14 | Discharge: 2022-02-18 | Disposition: A | Discharge status: Skilled Nursing Facility | Payer: Medicare HMO | Attending: Emergency Medicine | Admitting: Emergency Medicine

## 2022-02-14 DIAGNOSIS — I10 Essential (primary) hypertension: Secondary | ICD-10-CM | POA: Insufficient documentation

## 2022-02-14 DIAGNOSIS — F1090 Alcohol use, unspecified, uncomplicated: Secondary | ICD-10-CM | POA: Insufficient documentation

## 2022-02-14 DIAGNOSIS — R197 Diarrhea, unspecified: Secondary | ICD-10-CM | POA: Diagnosis not present

## 2022-02-14 DIAGNOSIS — E871 Hypo-osmolality and hyponatremia: Secondary | ICD-10-CM | POA: Diagnosis not present

## 2022-02-14 DIAGNOSIS — M6281 Muscle weakness (generalized): Secondary | ICD-10-CM | POA: Diagnosis not present

## 2022-02-14 DIAGNOSIS — R531 Weakness: Secondary | ICD-10-CM

## 2022-02-14 DIAGNOSIS — R110 Nausea: Secondary | ICD-10-CM | POA: Insufficient documentation

## 2022-02-14 DIAGNOSIS — Y908 Blood alcohol level of 240 mg/100 ml or more: Secondary | ICD-10-CM | POA: Insufficient documentation

## 2022-02-14 DIAGNOSIS — R4182 Altered mental status, unspecified: Secondary | ICD-10-CM | POA: Diagnosis present

## 2022-02-14 LAB — COMPREHENSIVE METABOLIC PANEL
ALT: 163 U/L — ABNORMAL HIGH (ref 0–44)
AST: 200 U/L — ABNORMAL HIGH (ref 15–41)
Albumin: 5 g/dL (ref 3.5–5.0)
Alkaline Phosphatase: 104 U/L (ref 38–126)
Anion gap: 12 (ref 5–15)
BUN: <5 mg/dL — ABNORMAL LOW (ref 8–23)
CO2: 30 mmol/L (ref 22–32)
Calcium: 9.1 mg/dL (ref 8.9–10.3)
Chloride: 88 mmol/L — ABNORMAL LOW (ref 98–111)
Creatinine, Ser: 0.63 mg/dL (ref 0.61–1.24)
GFR, Estimated: >60 mL/min (ref >60)
Glucose, Bld: 117 mg/dL — ABNORMAL HIGH (ref 70–99)
Potassium: 3.6 mmol/L (ref 3.5–5.1)
Sodium: 130 mmol/L — ABNORMAL LOW (ref 135–145)
Total Bilirubin: 1.4 mg/dL — ABNORMAL HIGH (ref 0.3–1.2)
Total Protein: 8.7 g/dL — ABNORMAL HIGH (ref 6.5–8.1)

## 2022-02-14 LAB — TROPONIN I (HIGH SENSITIVITY)
Troponin I (High Sensitivity): 9 ng/L (ref <18)
Troponin I (High Sensitivity): 9 ng/L (ref <18)

## 2022-02-14 LAB — CBC WITH DIFFERENTIAL/PLATELET
Abs Immature Granulocytes: 0.01 10*3/uL (ref 0.00–0.07)
Basophils Absolute: 0.1 10*3/uL (ref 0.0–0.1)
Basophils Relative: 1 %
Eosinophils Absolute: 0 10*3/uL (ref 0.0–0.5)
Eosinophils Relative: 0 %
HCT: 46.8 % (ref 39.0–52.0)
Hemoglobin: 16.1 g/dL (ref 13.0–17.0)
Immature Granulocytes: 0 %
Lymphocytes Relative: 59 %
Lymphs Abs: 6 10*3/uL — ABNORMAL HIGH (ref 0.7–4.0)
MCH: 31 pg (ref 26.0–34.0)
MCHC: 34.4 g/dL (ref 30.0–36.0)
MCV: 90.2 fL (ref 80.0–100.0)
Monocytes Absolute: 0.7 10*3/uL (ref 0.1–1.0)
Monocytes Relative: 7 %
Neutro Abs: 3.4 10*3/uL (ref 1.7–7.7)
Neutrophils Relative %: 33 %
Platelets: 184 10*3/uL (ref 150–400)
RBC: 5.19 MIL/uL (ref 4.22–5.81)
RDW: 11.9 % (ref 11.5–15.5)
WBC: 10.2 10*3/uL (ref 4.0–10.5)
nRBC: 0 % (ref 0.0–0.2)

## 2022-02-14 LAB — AMMONIA: Ammonia: 18 umol/L (ref 9–35)

## 2022-02-14 LAB — MAGNESIUM: Magnesium: 2.2 mg/dL (ref 1.7–2.4)

## 2022-02-14 LAB — ETHANOL: Alcohol, Ethyl (B): 315 mg/dL (ref <10)

## 2022-02-14 MED ORDER — IBUPROFEN 600 MG PO TABS
600.0000 mg | ORAL_TABLET | Freq: Once | ORAL | Status: AC
Start: 2022-02-14 — End: 2022-02-14
  Administered 2022-02-14: 600 mg via ORAL
  Filled 2022-02-14: qty 1

## 2022-02-14 MED ORDER — THIAMINE HCL 100 MG PO TABS
100.0000 mg | ORAL_TABLET | Freq: Every day | ORAL | Status: DC
  Administered 2022-02-15 – 2022-02-17 (×3): 100 mg via ORAL

## 2022-02-14 MED ORDER — LORAZEPAM 2 MG PO TABS
0.0000 mg | ORAL_TABLET | Freq: Two times a day (BID) | ORAL | Status: AC
  Administered 2022-02-16: 2 mg via ORAL
  Administered 2022-02-18: 1 mg via ORAL
  Filled 2022-02-14 (×2): qty 1

## 2022-02-14 MED ORDER — METOCLOPRAMIDE HCL 5 MG/ML IJ SOLN
10.0000 mg | Freq: Once | INTRAMUSCULAR | Status: AC
  Administered 2022-02-14: 10 mg via INTRAVENOUS
  Filled 2022-02-14: qty 2

## 2022-02-14 MED ORDER — CHLORDIAZEPOXIDE HCL 25 MG PO CAPS
25.0000 mg | ORAL_CAPSULE | Freq: Three times a day (TID) | ORAL | Status: AC
  Administered 2022-02-15 (×3): 25 mg via ORAL
  Filled 2022-02-14 (×3): qty 1

## 2022-02-14 MED ORDER — FOLIC ACID 1 MG PO TABS
1.0000 mg | ORAL_TABLET | Freq: Every day | ORAL | Status: DC
  Administered 2022-02-14 – 2022-02-18 (×5): 1 mg via ORAL
  Filled 2022-02-14 (×5): qty 1

## 2022-02-14 MED ORDER — CHLORDIAZEPOXIDE HCL 25 MG PO CAPS
25.0000 mg | ORAL_CAPSULE | Freq: Four times a day (QID) | ORAL | Status: AC
  Administered 2022-02-14 – 2022-02-15 (×4): 25 mg via ORAL
  Filled 2022-02-14 (×4): qty 1

## 2022-02-14 MED ORDER — THIAMINE HCL 100 MG/ML IJ SOLN: 100.0000 mg | Freq: Every day | INTRAMUSCULAR | Status: DC

## 2022-02-14 MED ORDER — SODIUM CHLORIDE 0.9 % IV BOLUS
1000.0000 mL | Freq: Once | INTRAVENOUS | Status: AC
Start: 2022-02-14 — End: 2022-02-14
  Administered 2022-02-14: 1000 mL via INTRAVENOUS

## 2022-02-14 MED ORDER — THIAMINE HCL 100 MG PO TABS
100.0000 mg | ORAL_TABLET | Freq: Every day | ORAL | Status: DC
  Administered 2022-02-14 – 2022-02-18 (×2): 100 mg via ORAL
  Filled 2022-02-14 (×6): qty 1

## 2022-02-14 MED ORDER — THIAMINE HCL 100 MG/ML IJ SOLN
100.0000 mg | Freq: Once | INTRAMUSCULAR | Status: AC
  Administered 2022-02-14: 100 mg via INTRAVENOUS
  Filled 2022-02-14: qty 2

## 2022-02-14 MED ORDER — CHLORDIAZEPOXIDE HCL 25 MG PO CAPS
50.0000 mg | ORAL_CAPSULE | Freq: Once | ORAL | Status: AC
  Administered 2022-02-14: 50 mg via ORAL
  Filled 2022-02-14: qty 2

## 2022-02-14 MED ORDER — ADULT MULTIVITAMIN W/MINERALS CH
1.0000 | ORAL_TABLET | Freq: Every day | ORAL | Status: DC
  Administered 2022-02-14 – 2022-02-18 (×5): 1 via ORAL
  Filled 2022-02-14 (×5): qty 1

## 2022-02-14 MED ORDER — SODIUM CHLORIDE 0.9 % IV BOLUS
1000.0000 mL | Freq: Once | INTRAVENOUS | Status: AC
  Administered 2022-02-14: 1000 mL via INTRAVENOUS

## 2022-02-14 MED ORDER — LORAZEPAM 2 MG/ML IJ SOLN
0.0000 mg | Freq: Two times a day (BID) | INTRAMUSCULAR | Status: AC
  Administered 2022-02-16 – 2022-02-17 (×2): 2 mg via INTRAVENOUS
  Filled 2022-02-14 (×2): qty 1

## 2022-02-14 MED ORDER — FOLIC ACID 5 MG/ML IJ SOLN
1.0000 mg | Freq: Every day | INTRAMUSCULAR | Status: DC
  Filled 2022-02-14: qty 0.2

## 2022-02-14 MED ORDER — LORAZEPAM 2 MG PO TABS
0.0000 mg | ORAL_TABLET | Freq: Four times a day (QID) | ORAL | Status: AC
  Administered 2022-02-15 – 2022-02-16 (×3): 2 mg via ORAL
  Filled 2022-02-14: qty 1
  Filled 2022-02-14: qty 2
  Filled 2022-02-14 (×2): qty 1

## 2022-02-14 MED ORDER — CHLORDIAZEPOXIDE HCL 25 MG PO CAPS
25.0000 mg | ORAL_CAPSULE | ORAL | Status: AC
  Administered 2022-02-16 (×2): 25 mg via ORAL
  Filled 2022-02-14 (×2): qty 1

## 2022-02-14 MED ORDER — LORAZEPAM 2 MG/ML IJ SOLN
0.0000 mg | Freq: Four times a day (QID) | INTRAMUSCULAR | Status: AC
  Administered 2022-02-14: 2 mg via INTRAVENOUS
  Filled 2022-02-14: qty 1

## 2022-02-14 MED ORDER — ONDANSETRON HCL 4 MG/2ML IJ SOLN
INTRAMUSCULAR | Status: AC
  Filled 2022-02-14: qty 2

## 2022-02-14 MED ORDER — CHLORDIAZEPOXIDE HCL 25 MG PO CAPS
25.0000 mg | ORAL_CAPSULE | Freq: Every day | ORAL | Status: AC
  Administered 2022-02-17: 25 mg via ORAL
  Filled 2022-02-14: qty 1

## 2022-02-14 NOTE — ED Notes (Signed)
Bed alarm placed on pt, non slip socks placed on pt at this time ?

## 2022-02-14 NOTE — ED Notes (Signed)
Warm blanket given to pt per request. No additional needs verbalized at this time. Bed low & locked; call light & personal items within reach. Bed alarm remains on. ?

## 2022-02-14 NOTE — Evaluation (Signed)
Physical Therapy Evaluation ?Patient Details ?Name: Douglas Peterson. ?MRN: [id number] ?DOB: [birth year] ?Today's Date: 02/14/2022 ? ?History of Present Illness ? 71yo male presenting with progressive weakness and reports of increased ETOH use. Pt also reporting diarrhea and incontinence. The pt reports fall, positive for hitting head. He presents with PMHx significant for HTN, atrial fibrillation, alcohol use disorder, TBI, pressure ulcer (location not noted), and chronic R spastic hemiplegia.  ?Clinical Impression ? The pt presents this session with a goal to return home, but following session agrees that he is not ready to return home at this time and will require continued PT. D/t PMHx significant for RUE spastic hemiplegia he is unable to utilize AD with bilateral UE support to decrease caregiver burden for mobility. The pt states that he is expecting to get a caregiver, but d/t significant unsteadiness with gait and fall risk he is not safe to d/c home with family care at this time.    ?   ? ?Recommendations for follow up therapy are one component of a multi-disciplinary discharge planning process, led by the attending physician.  Recommendations may be updated based on patient status, additional functional criteria and insurance authorization. ? ?Follow Up Recommendations Skilled nursing-short term rehab (<3 hours/day) ? ?  ?Assistance Recommended at Discharge Frequent or constant Supervision/Assistance  ?Patient can return home with the following ? A lot of help with walking and/or transfers;A lot of help with bathing/dressing/bathroom;Help with stairs or ramp for entrance ? ?  ?Equipment Recommendations    ?Recommendations for Other Services ?    ?  ?Functional Status Assessment    ? ?  ?Precautions / Restrictions Precautions ?Precautions: Fall  ? ?  ? ?Mobility ? Bed Mobility ?Overal bed mobility: Needs Assistance ?Bed Mobility: Supine to Sit, Sit to Supine ?  ?  ?Supine to sit: Min guard ?Sit to supine: Mod  assist ?  ?General bed mobility comments: HOB elevated; d/t height of bed required increased assistance to return to supine. ?  ? ?Transfers ?Overall transfer level: Needs assistance ?Equipment used: Straight cane ?Transfers: Sit to/from Stand, Bed to chair/wheelchair/BSC ?Sit to Stand: Mod assist ?Stand pivot transfers: Mod assist ?  ?  ?  ?  ?General transfer comment: Posterior lean with limited forward translation ?  ? ?Ambulation/Gait ?  ?  ?  ?  ?  ?  ?  ?General Gait Details: Did not trail ambulation this session, based on transfer pt is expected to require Mod A for ambulation limited step length with significant posterior lean without self correction. ? ?Stairs ?  ?  ?  ?  ?  ? ?Wheelchair Mobility ?  ? ?Modified Rankin (Stroke Patients Only) ?  ? ?  ? ?Balance Overall balance assessment: History of Falls, Needs assistance ?Sitting-balance support: Feet unsupported ?Sitting balance-Leahy Scale: Fair ?  ?  ?Standing balance support: During functional activity, Single extremity supported, Reliant on assistive device for balance ?Standing balance-Leahy Scale: Poor ?  ?  ?  ?  ?  ?  ?  ?  ?  ?  ?  ?  ?   ? ? ? ?Pertinent Vitals/Pain Pain Assessment ?Pain Assessment: 0-10 ?Pain Score: 5  ?Pain Location: head (laceration) and R hip ?Pain Descriptors / Indicators: Constant, Headache, Sore ?Pain Intervention(s): Limited activity within patient's tolerance, Monitored during session, RN gave pain meds during session  ? ? ?Home Living Family/patient expects to be discharged to:: Private residence ?Living Arrangements: Alone ?Available Help at Discharge: Family;Available PRN/intermittently ?Type  of Home: House ?Home Access: Stairs to enter ?  ?Entrance Stairs-Number of Steps: 1 ?  ?Home Layout: One level ?Home Equipment: Cane - single Barista (2 wheels);Cane - quad;Tub bench ?   ?  ?Prior Function Prior Level of Function : Independent/Modified Independent;History of Falls (last six months);Driving ?  ?  ?   ?  ?  ?  ?Mobility Comments: Mod Independent in community; use of SPC intermittently, fall with laceration ?ADLs Comments: Mod Indpendence with bathing and dressing; reports completing iADLs independently ?  ? ? ?Hand Dominance  ? Dominant Hand: Left ? ?  ?Extremity/Trunk Assessment  ? Upper Extremity Assessment ?Upper Extremity Assessment: RUE deficits/detail ?RUE Deficits / Details: Significant RUE tone with flexion contracture ?RUE Coordination: decreased gross motor ?  ? ?Lower Extremity Assessment ?Lower Extremity Assessment: Generalized weakness ?  ? ?   ?Communication  ?    ?Cognition Arousal/Alertness: Awake/alert ?Behavior During Therapy: [facility] for tasks assessed/performed ?Overall Cognitive Status: No family/caregiver present to determine baseline cognitive functioning ?  ?  ?  ?  ?  ?  ?  ?  ?  ?  ?  ?  ?  ?  ?  ?  ?  ?  ?  ? ?  ?General Comments   ? ?  ?Exercises Other Exercises ?Other Exercises: Hip bridges in supine x5  ? ?Assessment/Plan  ?  ?PT Assessment Patient needs continued PT services  ?PT Problem List Decreased strength;Decreased mobility;Decreased balance;Decreased skin integrity;Impaired tone;Decreased activity tolerance ? ?   ?  ?PT Treatment Interventions Gait training;Therapeutic exercise;Balance training;Stair training;Therapeutic activities;Patient/family education;Functional mobility training   ? ?PT Goals (Current goals can be found in the Care Plan section)  ?Acute Rehab PT Goals ?Patient Stated Goal: To get better and return home ?PT Goal Formulation: With patient ?Time For Goal Achievement: 02/28/22 ?Potential to Achieve Goals: Fair ? ?  ?Frequency Min 2X/week ?  ? ? ?Co-evaluation   ?  ?  ?  ?  ? ? ?  ?AM-PAC PT "6 Clicks" Mobility  ?Outcome Measure Help needed turning from your back to your side while in a flat bed without using bedrails?: A Little ?Help needed moving from lying on your back to sitting on the side of a flat bed without using bedrails?: A Lot ?Help needed moving to  and from a bed to a chair (including a wheelchair)?: A Lot ?Help needed standing up from a chair using your arms (e.g., wheelchair or bedside chair)?: A Lot ?Help needed to walk in hospital room?: A Lot ?Help needed climbing 3-5 steps with a railing? : A Lot ?6 Click Score: 13 ? ?  ?End of Session Equipment Utilized During Treatment: Gait belt ?Activity Tolerance: Patient tolerated treatment well;Patient limited by pain ?Patient left: in bed;with bed alarm set;with call bell/phone within reach ?Nurse Communication: Mobility status;Patient requests pain meds ?PT Visit Diagnosis: Unsteadiness on feet (R26.81);History of falling (Z91.81);Difficulty in walking, not elsewhere classified (R26.2);Muscle weakness (generalized) (M62.81);Pain ?Pain - Right/Left: Right ?Pain - part of body:  (head) ?  ? ?Time: 3329-5188 ?PT Time Calculation (min) (ACUTE ONLY): 54 min ? ? ?Charges:   PT Evaluation ?$PT Eval Moderate Complexity: 1 Mod ?PT Treatments ?$Therapeutic Exercise: 8-22 mins ?$Therapeutic Activity: 23-37 mins ?  ?   ? ? ?1:42 PM, 02/14/22 ? A. Saverio Danker, PT, DPT ?Physical Therapist - [place] ?[facility] ? ? ? A  ?02/14/2022, 1:39 PM ? ?

## 2022-02-14 NOTE — ED Notes (Signed)
Pt incontinent of urine. Pt changed, purewick applied.  ?

## 2022-02-14 NOTE — TOC Initial Note (Signed)
Transition of Care (TOC) - Initial/Assessment Note  ? ? ?Patient Details  ?Name: Douglas Peterson. ?MRN: [id number] ?Date of Birth: 12/05/1950 ? ?Transition of Care (TOC) CM/SW Contact:    ?Shelbie Hutching, RN ?Phone Number: ?02/14/2022, 10:51 AM ? ?Clinical Narrative:                 ?Patient brought into the hospital by EMS from home for weakness and incontinence at home, patient also fell at home and has a laceration to the back of his head.  Patient does not need to be admitted to the hospital.  MD has put in for PT and OT evals.  RNCM met with patient at the bedside.  Patient lives alone in [place], he is independent with a cane at baseline.  He reports that his sister [name] has been checking in on him.  Patient has a long history with alcoholism and TOC is very familiar with patient dynamics. ?He agrees to go to rehab.  He has been to [facility] in the past several times and would agree to go back to them.  SNF workup started. ? ?Expected Discharge Plan: [place] ?Barriers to Discharge: SNF Pending bed offer, ED SNF auth ? ? ?Patient Goals and CMS Choice ?Patient states their goals for this hospitalization and ongoing recovery are:: Patient agrees to go to short term rehab ?CMS Medicare.gov Compare Post Acute Care list provided to:: Patient ?Choice offered to / list presented to : Patient ? ?Expected Discharge Plan and Services ?Expected Discharge Plan: [place] ?  ?Discharge Planning Services: CM Consult ?Post Acute Care Choice: [place] ?Living arrangements for the past 2 months: [place] ?                ?DME Arranged: N/A ?DME Agency: NA ?  ?  ?  ?HH Arranged: NA ?[place] Agency: NA ?  ?  ?  ? ?Prior Living Arrangements/Services ?Living arrangements for the past 2 months: [place] ?Lives with:: Self ?Patient language and need for interpreter reviewed:: Yes ?Do you feel safe going back to the place where you live?: Yes      ?Need for Family Participation  in Patient Care: Yes (Comment) ?Care giver support system in place?: Yes (comment) (says sister has been checking in) ?Current home services: DME (cane) ?Criminal Activity/Legal Involvement Pertinent to Current Situation/Hospitalization: No - Comment as needed ? ?Activities of Daily Living ?  ?  ? ?Permission Sought/Granted ?Permission sought to share information with : Case Manager, Customer service manager, Family Supports ?Permission granted to share information with : Yes, Verbal Permission Granted ?   ? Permission granted to share info w AGENCY: [facility] ?   ?   ? ?Emotional Assessment ?Appearance:: Appears stated age ?Attitude/Demeanor/Rapport: Lethargic ?Affect (typically observed): Accepting ?Orientation: : Oriented to Self, Oriented to Place, Oriented to  Time, Oriented to Situation ?Alcohol / Substance Use: Alcohol Use ?Psych Involvement: No (comment) ? ?Admission diagnosis:  Weakness ?Patient Active Problem List  ? Diagnosis Date Noted  ? Dysphonia 08/13/2021  ? 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 Paresthesia of right upper extremity 08/13/2021  ? SIRS (systemic inflammatory response syndrome) ([place]) 08/05/2020  ? GERD (gastroesophageal reflux disease) 06/05/2020  ? Alcohol use disorder, severe, dependence ([place]) 05/28/2020  ? Paroxysmal A-fib ([place]) 03/17/2020  ? Severe protein-calorie malnutrition ([place]) 02/25/2020  ? Primary insomnia 02/06/2020  ? Left inguinal hernia 01/31/2020  ? Encounter for competency evaluation   ?  Pancytopenia ([place])   ? Alcoholic cirrhosis of liver without ascites ([place])   ? Thrombocytopenia ([place])   ? 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 Alcohol withdrawal delirium, acute, hyperactive ([place]) 09/21/2019  ? Pressure injury of ankle, stage 1 08/15/2019  ? Palliative care by specialist   ? Alcohol withdrawal ([place]) 03/20/2019  ? Closed fracture of right proximal humerus 03/19/2019  ? Vitamin D deficiency 01/11/2019  ? Osteoporosis  12/22/2018  ? Closed nondisplaced fracture of acromial process of right scapula with routine healing 11/15/2018  ? Compression fracture of L2 vertebra with routine healing 11/15/2018  ? Delirium tremens ([place]) 03/08/2018  ? HTN (hypertension) 09/16/2017  ? 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 Steatohepatitis 12/03/2016  ? Abnormal liver enzymes 06/17/2016  ? Hereditary hemochromatosis ([place]) 06/17/2016  ? Anxiety 07/16/2015  ? Benign essential hypertension 05/22/2014  ? H/O traumatic brain injury 05/22/2014  ? 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[place]) 05/22/2014  ? ?PCP:  Eda Paschal, MD ?Pharmacy:   ?[facility] [address] ?[place] [address] ?Phone: [phone] Fax: [phone] ? ? ? ? ?Social Determinants of Health (SDOH) Interventions ?  ? ?Readmission Risk Interventions ? ?  08/23/2020  ? 11:52 AM 08/19/2020  ?  1:32 PM 08/14/2020  ?  9:43 AM  ?Readmission Risk Prevention Plan  ?Transportation Screening Complete Complete Complete  ?Medication Review Press photographer) Complete Complete   ?PCP or Specialist appointment within 3-5 days of discharge  Complete Complete  ?[place] or Home Care Consult Complete  Complete  ?SW Recovery Care/Counseling Consult  Complete Complete  ?Palliative Care Screening  Not Applicable Not Applicable  ?[place] Not Applicable  Patient Refused  ? ? ? ?
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
## 2022-02-14 NOTE — ED Notes (Signed)
Pt with abrasion posterior head. Per pt injury came from fall at home. Per report, head CT done on arrival to ED. Wound cleaned with 1/2 peroxide and 1/2 saline. Unable to apply dressing due to hair.  ?

## 2022-02-14 NOTE — Evaluation (Addendum)
Occupational Therapy Evaluation Patient Details Name: Douglas Peterson. MRN: [id number] DOB: 02/20/1951 Today's Date: 02/14/2022   History of Present Illness 71yo male presenting with progressive weakness and reports of increased ETOH use. Pt also reporting diarrhea and incontinence. The pt reports fall, positive for hitting head. He presents with PMHx significant for HTN, atrial fibrillation, alcohol use disorder, TBI, pressure ulcer (location not noted), and chronic R spastic hemiplegia.   Clinical Impression   Chart reviewed, RN cleared pt for participation in OT evaluation. Pt reports he was ind in all IADL/ADL PTA- of note pt was recently in STR in 11/2021 due to functional deficits in ADL task completion and pt generally appears with dirt under finger nails/toe nails, generally with poor hygiene. At this time pt requires MOD A for supine<>sit, with poor seated sitting balance noted during ADL task completion with posterior lean throughout. STS completed with MOD A. ADL tasks completed with UB dressing seated at edge of bed with MOD A, LB dressing bed level with MAX A as pt is unable to bend down to donn/doff socks, grooming at edge of bed with SET UP. TOTAL A for R hand care due to baseline flexion contracture. This appears to be below pt current baseline status of performance in ADL/functional mobility completion. At this time pt would benefit from STR to address deficits, OT will continue to follow while admitted.   HR up to 130s during mobility, down to between 90-100 with rest.    Recommendations for follow up therapy are one component of a multi-disciplinary discharge planning process, led by the attending physician.  Recommendations may be updated based on patient status, additional functional criteria and insurance authorization.   Follow Up Recommendations  Skilled nursing-short term rehab (<3 hours/day)    Assistance Recommended at Discharge Frequent or constant Supervision/Assistance   Patient can return home with the following A lot of help with walking and/or transfers;A lot of help with bathing/dressing/bathroom;Assist for transportation;Assistance with cooking/housework;Direct supervision/assist for financial management;Help with stairs or ramp for entrance;Direct supervision/assist for medications management    Functional Status Assessment  Patient has had a recent decline in their functional status and demonstrates the ability to make significant improvements in function in a reasonable and predictable amount of time.  Equipment Recommendations  Other (comment) (per next venue of care)    Recommendations for Other Services       Precautions / Restrictions Precautions Precautions: Fall Restrictions Weight Bearing Restrictions: No      Mobility Bed Mobility Overal bed mobility: Needs Assistance Bed Mobility: Supine to Sit, Sit to Supine     Supine to sit: Mod assist Sit to supine: Mod assist        Transfers Overall transfer level: Needs assistance Equipment used: Straight cane Transfers: Sit to/from Stand Sit to Stand: Mod assist                  Balance Overall balance assessment: History of Falls, Needs assistance Sitting-balance support: Feet unsupported Sitting balance-Leahy Scale: Poor Sitting balance - Comments: posterior lean throughout   Standing balance support: During functional activity, Single extremity supported, Reliant on assistive device for balance Standing balance-Leahy Scale: Poor                             ADL either performed or assessed with clinical judgement   ADL Overall ADL's : Needs assistance/impaired  General ADL Comments: UB dressing seated at edge of bed with MOD A, LB dressing bed level with MAX A as pt is unable to bend down to donn/doff socks, grooming at edge of bed with SET UP. TOTAL A for R hand care.     Vision Patient Visual  Report: No change from baseline       Perception     Praxis      Pertinent Vitals/Pain Pain Assessment Pain Assessment: No/denies pain     Hand Dominance Left   Extremity/Trunk Assessment Upper Extremity Assessment Upper Extremity Assessment: RUE deficits/detail;Generalized weakness RUE Deficits / Details: RUE in flexion synergy pattern which is baseline. R hand with significant malodor and build up noted. RUE Coordination: decreased fine motor;decreased gross motor   Lower Extremity Assessment Lower Extremity Assessment: Generalized weakness       Communication Communication Communication: No difficulties   Cognition Arousal/Alertness: Awake/alert Behavior During Therapy: WFL for tasks assessed/performed Overall Cognitive Status: Impaired/Different from baseline Area of Impairment: Attention, Following commands, Safety/judgement, Awareness, Problem solving                   Current Attention Level: Sustained   Following Commands: Follows one step commands consistently, Follows multi-step commands inconsistently Safety/Judgement: Decreased awareness of safety Awareness: Emergent Problem Solving: Slow processing, Decreased initiation, Requires verbal cues       General Comments       Exercises Other Exercises Other Exercises: edu re: role of OT, role of rehab, discharge recommendations, falls prevention   Shoulder Instructions      Home Living Family/patient expects to be discharged to:: Private residence Living Arrangements: Alone Available Help at Discharge: Family;Available PRN/intermittently Type of Home: House Home Access: Stairs to enter [employer] of Steps: 1   Home Layout: One level     Bathroom Shower/Tub: Chief Strategy Officer: Standard     Home Equipment: Cane - single Librarian, academic (2 wheels);Cane - quad;Shower seat          Prior Functioning/Environment Prior Level of Function :  Independent/Modified Independent;History of Falls (last six months);Driving             Mobility Comments: Pt reports use of SPC at home and in community, +fall history ADLs Comments: pt reports MOD I with bathing/dressing however appears unkempt with dirt under toe nails and finger nails; pt reports he is able to complete IADLs independently.        OT Problem List: Decreased strength;Decreased knowledge of use of DME or AE;Decreased range of motion;Decreased activity tolerance;Impaired balance (sitting and/or standing);Decreased safety awareness;Impaired UE functional use      OT Treatment/Interventions: Self-care/ADL training;Manual therapy;Therapeutic exercise;Patient/family education;Modalities;Balance training;Therapeutic activities;DME and/or AE instruction    OT Goals(Current goals can be found in the care plan section) Acute Rehab OT Goals Patient Stated Goal: go home OT Goal Formulation: With patient Time For Goal Achievement: 02/28/22 Potential to Achieve Goals: Fair ADL Goals Pt Will Perform Grooming: with supervision Pt Will Perform Upper Body Dressing: with supervision Pt Will Perform Lower Body Dressing: with supervision Pt Will Transfer to Toilet: with supervision Pt Will Perform Toileting - Clothing Manipulation and hygiene: with supervision  OT Frequency: Min 2X/week    Co-evaluation              AM-PAC OT "6 Clicks" Daily Activity     Outcome Measure Help from another person eating meals?: None Help from another person taking care of personal grooming?: A Little Help from  another person toileting, which includes using toliet, bedpan, or urinal?: A Lot Help from another person bathing (including washing, rinsing, drying)?: A Lot Help from another person to put on and taking off regular upper body clothing?: A Little Help from another person to put on and taking off regular lower body clothing?: Total 6 Click Score: 15   End of Session Equipment  Utilized During Treatment: Gait belt Nurse Communication: Mobility status  Activity Tolerance: Patient tolerated treatment well Patient left: with call bell/phone within reach;with bed alarm set  OT Visit Diagnosis: History of falling (Z91.81);Unsteadiness on feet (R26.81);Muscle weakness (generalized) (M62.81)                Time: 9563-8756 OT Time Calculation (min): 29 min Charges:  OT General Charges $OT Visit: 1 Visit OT Evaluation $OT Eval Low Complexity: 1 Low OT Treatments $Self Care/Home Management : 8-22 mins  Oleta Mouse, OTD OTR/L  02/14/22, 3:28 PM

## 2022-02-14 NOTE — ED Notes (Signed)
Pt sleeping at this time. Breakfast tray at bedside ?

## 2022-02-14 NOTE — ED Notes (Signed)
Pt given ginger ale, per request. Pt repositioned for comfort. No additional needs verbalized at this time. Bed low & locked; call light & personal items within reach. Bed alarm remains on. ?

## 2022-02-14 NOTE — ED Notes (Signed)
OT at bedside to work with pt. ?

## 2022-02-14 NOTE — [place] FL2 (Signed)
?[place] MEDICAID FL2 LEVEL OF CARE SCREENING TOOL  ?  ? ?IDENTIFICATION  ?Patient Name: ?Douglas Peterson. Birthdate: April 18, 1951 Sex: male Admission Date (Current Location): ?02/14/2022  ?[place] and [place] Number: ? [place] ?  Facility and Address:  ?[facility], [address] [place], [place] [address] ?     Provider Number: ?[id number]  ?Attending Physician Name and Address:  ?Rada Hay, MD ? Relative Name and Phone Number:  ?[name]- daughter- [phone] ?   ?Current Level of Care: ?Hospital Recommended Level of Care: ?[place] Prior Approval Number: ?  ? ?Date Approved/Denied: ?  PASRR Number: ?[id number] A ? ?Discharge Plan: ?SNF ?  ? ?Current Diagnoses: ?Patient Active Problem List  ? Diagnosis Date Noted  ? Dysphonia 08/13/2021  ? 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 Paresthesia of right upper extremity 08/13/2021  ? SIRS (systemic inflammatory response syndrome) ([place]) 08/05/2020  ? GERD (gastroesophageal reflux disease) 06/05/2020  ? Alcohol use disorder, severe, dependence ([place]) 05/28/2020  ? Paroxysmal A-fib ([place]) 03/17/2020  ? Severe protein-calorie malnutrition ([place]) 02/25/2020  ? Primary insomnia 02/06/2020  ? Left inguinal hernia 01/31/2020  ? Encounter for competency evaluation   ? Pancytopenia ([place])   ? Alcoholic cirrhosis of liver without ascites ([place])   ? Thrombocytopenia ([place])   ? 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 Alcohol withdrawal delirium, acute, hyperactive ([place]) 09/21/2019  ? Pressure injury of ankle, stage 1 08/15/2019  ? Palliative care by specialist   ? Alcohol withdrawal ([place]) 03/20/2019  ? Closed fracture of right proximal humerus 03/19/2019  ? Vitamin D deficiency 01/11/2019  ? Osteoporosis 12/22/2018  ? Closed nondisplaced fracture of acromial process of right scapula with routine healing 11/15/2018  ? Compression fracture of L2 vertebra with routine healing 11/15/2018  ? Delirium  tremens ([place]) 03/08/2018  ? HTN (hypertension) 09/16/2017  ? 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 Steatohepatitis 12/03/2016  ? Abnormal liver enzymes 06/17/2016  ? Hereditary hemochromatosis ([place]) 06/17/2016  ? Anxiety 07/16/2015  ? Benign essential hypertension 05/22/2014  ? H/O traumatic brain injury 05/22/2014  ? 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[place]) 05/22/2014  ? ? ?Orientation RESPIRATION BLADDER Height & Weight   ?  ?Self, Time, Situation, Place ? Normal Incontinent Weight: 63.3 kg ?Height:     ?BEHAVIORAL SYMPTOMS/MOOD NEUROLOGICAL BOWEL NUTRITION STATUS  ?    Incontinent Diet (regular)  ?AMBULATORY STATUS COMMUNICATION OF NEEDS Skin   ?Extensive Assist Verbally Skin abrasions (laceration to the back of the head) ?  ?  ?  ?    ?     ?     ? ? ?Personal Care Assistance Level of Assistance  ?Bathing, Feeding, Dressing Bathing Assistance: Maximum assistance ?Feeding assistance: Limited assistance ?Dressing Assistance: Maximum assistance ?   ? ?Functional Limitations Info  ?Sight, Hearing, Speech Sight Info: Adequate ?Hearing Info: Adequate ?Speech Info: Adequate  ? ? ?SPECIAL CARE FACTORS FREQUENCY  ?PT (By licensed PT), OT (By licensed OT)   ?  ?PT Frequency: 5 times per week ?OT Frequency: 5 times per week ?  ?  ?  ?   ? ? ?Contractures Contractures Info: Not present  ? ? ?Additional Factors Info  ?Code Status, Allergies Code Status Info: Full ?Allergies Info: Hydrochlorothiazide ?  ?  ?  ?   ? ?Current Medications (02/14/2022):  This is the current hospital active medication list ?Current Facility-Administered Medications  ?Medication Dose Route Frequency Provider Last Rate Last Admin  ?  chlordiazePOXIDE (LIBRIUM) capsule 25 mg  25 mg Oral QID Rada Hay, MD   25 mg at 02/14/22 4259  ? Followed by  ? [START ON 02/15/2022] chlordiazePOXIDE (LIBRIUM) capsule 25 mg  25 mg Oral TID Rada Hay, MD      ? Followed by  ? Derrill Memo ON 02/16/2022] chlordiazePOXIDE (LIBRIUM) capsule 25 mg  25 mg  Oral BH-qamhs Rada Hay, MD      ? Followed by  ? Derrill Memo ON 02/17/2022] chlordiazePOXIDE (LIBRIUM) capsule 25 mg  25 mg Oral Daily Rada Hay, MD      ? folic acid (FOLVITE) tablet 1 mg  1 mg Oral Daily Nena Polio, MD   1 mg at 02/14/22 1025  ? LORazepam (ATIVAN) injection 0-4 mg  0-4 mg Intravenous Q6H Rada Hay, MD   2 mg at 02/14/22 0346  ? Or  ? LORazepam (ATIVAN) tablet 0-4 mg  0-4 mg Oral Q6H Rada Hay, MD      ? Derrill Memo ON 02/16/2022] LORazepam (ATIVAN) injection 0-4 mg  0-4 mg Intravenous Q12H Rada Hay, MD      ? Or  ? Derrill Memo ON 02/16/2022] LORazepam (ATIVAN) tablet 0-4 mg  0-4 mg Oral Q12H Rada Hay, MD      ? multivitamin with minerals tablet 1 tablet  1 tablet Oral Daily Rada Hay, MD   1 tablet at 02/14/22 5638  ? thiamine tablet 100 mg  100 mg Oral Daily Rada Hay, MD   100 mg at 02/14/22 7564  ? Or  ? thiamine (B-1) injection 100 mg  100 mg Intravenous Daily Rada Hay, MD      ? Derrill Memo ON 02/15/2022] thiamine tablet 100 mg  100 mg Oral Daily Rada Hay, MD      ? ?Current Outpatient Medications  ?Medication Sig Dispense Refill  ? acetaminophen (TYLENOL) 500 MG tablet Take 1 tablet (500 mg total) by mouth every 8 (eight) hours as needed for mild pain or fever. 15 tablet 0  ? Ascorbic Acid (VITAMIN C) 1000 MG tablet Take 1 tablet (1,000 mg total) by mouth daily. 30 tablet 0  ? aspirin 81 MG chewable tablet Chew 81 mg by mouth daily.    ? atenolol (TENORMIN) 25 MG tablet Take 1 tablet (25 mg total) by mouth 2 (two) times daily. 30 tablet 0  ? Cholecalciferol 125 MCG (5000 UT) TABS Take 1 tablet (5,000 Units total) by mouth daily. 30 tablet 0  ? omeprazole (PRILOSEC) 10 MG capsule Take 10 mg by mouth daily.    ? PARoxetine (PAXIL) 30 MG tablet Take 1 tablet (30 mg total) by mouth daily. 30 tablet 0  ? polyethylene glycol powder (GLYCOLAX/MIRALAX) 17 GM/SCOOP powder Take 17 g by mouth daily. 255 g 0  ? senna (SENOKOT)  8.6 MG TABS tablet Take 2 tablets by mouth 2 (two) times daily.    ? tamsulosin (FLOMAX) 0.4 MG CAPS capsule Take 0.4 mg by mouth daily.    ? tiZANidine (ZANAFLEX) 4 MG tablet Take 8 mg by mouth 2 (two) times daily.    ? aspirin EC 81 MG tablet Take 1 tablet (81 mg total) by mouth daily. Swallow whole. 30 tablet 0  ? atorvastatin (LIPITOR) 10 MG tablet Take 1 tablet (10 mg total) by mouth daily. 30 tablet 0  ? baclofen (LIORESAL) 10 MG tablet Take 1 tablet (10 mg total) by mouth 3 (three) times daily. (Patient not taking: Reported on  02/14/2022) 30 each 0  ? Calcium Carb-Cholecalciferol (OYSTER SHELL CALCIUM W/D) 500-200 MG-UNIT TABS Take 1 tablet by mouth daily as needed. 30 tablet 0  ? doxepin (SINEQUAN) 10 MG capsule Take 1 capsule (10 mg total) by mouth at bedtime as needed (sleep). (Patient not taking: Reported on 02/14/2022) 30 capsule 0  ? feeding supplement (ENSURE ENLIVE / ENSURE PLUS) LIQD Take 237 mLs by mouth 2 (two) times daily between meals. 270 mL 12  ? folic acid (FOLVITE) 1 MG tablet Take 1 tablet (1 mg total) by mouth daily. 30 tablet 0  ? hydrOXYzine (ATARAX/VISTARIL) 25 MG tablet Take 1 tablet (25 mg total) by mouth 3 (three) times daily as needed for anxiety or itching. 30 tablet 0  ? Multiple Vitamin (MULTIVITAMIN WITH MINERALS) TABS tablet Take 1 tablet by mouth daily. 30 tablet 0  ? ondansetron (ZOFRAN) 4 MG tablet Take 4 mg by mouth every 8 (eight) hours as needed. (Patient not taking: Reported on 02/14/2022)    ? senna-docusate (SENOKOT-S) 8.6-50 MG tablet Take 2 tablets by mouth at bedtime. 30 tablet 1  ? Sod Picosulfate-Mag Ox-Cit Acd (CLENPIQ) 10-3.5-12 MG-GM -GM/160ML SOLN Take 320 mLs by mouth as directed. (Patient not taking: Reported on 11/28/2021) 320 mL 0  ? thiamine 100 MG tablet Take 1 tablet (100 mg total) by mouth daily. (Patient not taking: Reported on 02/14/2022) 30 tablet 0  ? tiZANidine (ZANAFLEX) 2 MG tablet Take 4 mg by mouth 2 (two) times daily. (Patient not taking: Reported  on 02/14/2022)    ? ? ? ?Discharge Medications: ?Please see discharge summary for a list of discharge medications. ? ?Relevant Imaging Results: ? ?Relevant Lab Results: ? ? ?Additional Information ?SS# [id number]

## 2022-02-14 NOTE — ED Notes (Signed)
Secure msg sent to Dr. Tamala Julian re: pts request for nausea med. ?

## 2022-02-14 NOTE — TOC Progression Note (Signed)
Transition of Care (TOC) - Progression Note  ? ? ?Patient Details  ?Name: Douglas Peterson. ?MRN: [id number] ?Date of Birth: 06-Apr-1951 ? ?Transition of Care (TOC) CM/SW Contact  ?Shelbie Hutching, RN ?Phone Number: ?02/14/2022, 4:40 PM ? ?Clinical Narrative:    ?[facility] has accepted patient and will start insurance authorization.  ? ? ?Expected Discharge Plan: [place] ?Barriers to Discharge: SNF Pending bed offer, ED SNF auth ? ?Expected Discharge Plan and Services ?Expected Discharge Plan: [place] ?  ?Discharge Planning Services: CM Consult ?Post Acute Care Choice: [place] ?Living arrangements for the past 2 months: [place] ?                ?DME Arranged: N/A ?DME Agency: NA ?  ?  ?  ?HH Arranged: NA ?[place] Agency: NA ?  ?  ?  ? ? ?Social Determinants of Health (SDOH) Interventions ?  ? ?Readmission Risk Interventions ? ?  08/23/2020  ? 11:52 AM 08/19/2020  ?  1:32 PM 08/14/2020  ?  9:43 AM  ?Readmission Risk Prevention Plan  ?Transportation Screening Complete Complete Complete  ?Medication Review Press photographer) Complete Complete   ?PCP or Specialist appointment within 3-5 days of discharge  Complete Complete  ?[place] or Home Care Consult Complete  Complete  ?SW Recovery Care/Counseling Consult  Complete Complete  ?Palliative Care Screening  Not Applicable Not Applicable  ?[place] Not Applicable  Patient Refused  ? ? ?

## 2022-02-14 NOTE — ED Notes (Signed)
Lunch brought to pt & pt set up to eat. No additional needs verbalized at this time. Bed low & locked; call light & personal items within reach. ?

## 2022-02-14 NOTE — ED Triage Notes (Signed)
Pt to ED via EMS from home, pt has had inc weakness x few days, pt reports dec oral intake other than beer, pt has been unable to move around at home, pt has urinated and defecated on self. Pt states last beer yesterday.   ?

## 2022-02-14 NOTE — ED Notes (Signed)
PT at bedside to work with pt. ?

## 2022-02-14 NOTE — ED Notes (Signed)
TOC, alcohol use disorder ?  ?Rada Hay, MD ?02/14/22 6840 ? ?

## 2022-02-14 NOTE — ED Notes (Signed)
Per pt, he no longer has a legal guardian. When asked specifically if DSS was has guardian pt answered no and preceded with a lot of negative comments about DSS.  ?

## 2022-02-14 NOTE — ED Notes (Signed)
See note in narrator ?

## 2022-02-14 NOTE — ED Notes (Signed)
Pt sleeping, see flowsheet. Siderails up x 2, bed in low position. No issues noted. ?

## 2022-02-15 LAB — URINALYSIS, ROUTINE W REFLEX MICROSCOPIC
Bilirubin Urine: NEGATIVE
Glucose, UA: NEGATIVE mg/dL
Hgb urine dipstick: NEGATIVE
Ketones, ur: 5 mg/dL — AB
Nitrite: NEGATIVE
Protein, ur: NEGATIVE mg/dL
Specific Gravity, Urine: 1.015 (ref 1.005–1.030)
Squamous Epithelial / HPF: NONE SEEN (ref 0–5)
pH: 6 (ref 5.0–8.0)

## 2022-02-15 MED ORDER — ACETAMINOPHEN 500 MG PO TABS
500.0000 mg | ORAL_TABLET | Freq: Three times a day (TID) | ORAL | Status: DC | PRN
  Administered 2022-02-15 – 2022-02-16 (×2): 500 mg via ORAL
  Filled 2022-02-15 (×3): qty 1

## 2022-02-15 NOTE — ED Notes (Signed)
Pt sleeping in bed in hallway. ?

## 2022-02-15 NOTE — ED Notes (Signed)
Pt repositioned in bed, sat up and assisted with eating lunch.  ?

## 2022-02-15 NOTE — ED Notes (Signed)
Dr. Karma Greaser is made aware of urine result. No further orders for this nurse at this time. ?

## 2022-02-15 NOTE — TOC Progression Note (Addendum)
Transition of Care (TOC) - Progression Note  ? ? ?Patient Details  ?Name: Douglas Peterson. ?MRN: [id number] ?Date of Birth: 1951-08-27 ? ?Transition of Care (TOC) CM/SW Contact  ?[place], LCSWA ?Phone Number: ?02/15/2022, 11:20 AM ? ?Clinical Narrative:    ? ?CSW spoke to patient at bedside to let him know [facility] accepted his bed offer and he was agreeable. CSW spoke to [facility] [phone]) to follow up on insurance authorization and was told we will have to reach back out to their admissions office on Monday. ? ?CSW called patient's daughter [name] [phone]) to give his update.  ? ? ?Expected Discharge Plan: [place] ?Barriers to Discharge: SNF Pending bed offer, ED SNF auth ? ?Expected Discharge Plan and Services ?Expected Discharge Plan: [place] ?  ?Discharge Planning Services: CM Consult ?Post Acute Care Choice: [place] ?Living arrangements for the past 2 months: [place] ?                ?DME Arranged: N/A ?DME Agency: NA ?  ?  ?  ?HH Arranged: NA ?[place] Agency: NA ?  ?  ?  ? ? ?Social Determinants of Health (SDOH) Interventions ?  ? ?Readmission Risk Interventions ? ?  08/23/2020  ? 11:52 AM 08/19/2020  ?  1:32 PM 08/14/2020  ?  9:43 AM  ?Readmission Risk Prevention Plan  ?Transportation Screening Complete Complete Complete  ?Medication Review Press photographer) Complete Complete   ?PCP or Specialist appointment within 3-5 days of discharge  Complete Complete  ?[place] or Home Care Consult Complete  Complete  ?SW Recovery Care/Counseling Consult  Complete Complete  ?Palliative Care Screening  Not Applicable Not Applicable  ?[place] Not Applicable  Patient Refused  ? ? ?

## 2022-02-15 NOTE — ED Notes (Signed)
Pt eating breakfast at this time.  

## 2022-02-15 NOTE — ED Notes (Addendum)
Pt provided a gingerale and crackers - no other concerns expressed at this time.  ?

## 2022-02-15 NOTE — ED Notes (Signed)
Pt placed on a hospital bed to promote comfort by EDT Linus Orn). No needs expressed at this time.  ?

## 2022-02-16 LAB — URINE CULTURE: Culture: >=100000 — AB

## 2022-02-16 MED ORDER — LIDOCAINE VISCOUS HCL 2 % MT SOLN
15.0000 mL | Freq: Once | OROMUCOSAL | Status: AC
  Administered 2022-02-16: 15 mL via ORAL
  Filled 2022-02-16: qty 15

## 2022-02-16 MED ORDER — ONDANSETRON 4 MG PO TBDP
4.0000 mg | ORAL_TABLET | ORAL | Status: DC | PRN
  Administered 2022-02-16 – 2022-02-17 (×3): 4 mg via ORAL
  Filled 2022-02-16 (×3): qty 1

## 2022-02-16 MED ORDER — ALUM & MAG HYDROXIDE-SIMETH 200-200-20 MG/5ML PO SUSP
30.0000 mL | Freq: Once | ORAL | Status: AC
  Administered 2022-02-16: 30 mL via ORAL
  Filled 2022-02-16: qty 30

## 2022-02-16 NOTE — ED Notes (Signed)
Pt laying calmly on stretcher; resp reg/unlabored; skin dry; in NAD. Pt requesting blankets in bed be rearranged; completed for pt.  ?

## 2022-02-16 NOTE — ED Notes (Signed)
Pt resting comfortably at this time. Normal rise and fall of chest. NAD noted.  ?

## 2022-02-16 NOTE — ED Notes (Signed)
Pt states he is continuing to have abd pain and nausea. Md notified, order for gi cocktail received.  ?

## 2022-02-16 NOTE — ED Notes (Signed)
Pt resting quietly with eyes closed

## 2022-02-16 NOTE — ED Notes (Signed)
Pt dinner tray sat up.  ?

## 2022-02-16 NOTE — ED Notes (Signed)
Pt assisted with washing, linen change and oral care. Pt denies needs at this time. Resting peacefully ?

## 2022-02-16 NOTE — ED Notes (Signed)
Pt given breakfast tray, pt repositioned in bed for comfort and ease of eating. NAD noted. Pt denies any needs at this time.  ?

## 2022-02-16 NOTE — ED Notes (Signed)
Pt ate 75%of meal ?

## 2022-02-16 NOTE — ED Notes (Signed)
Pt brief and purewick changed due to pt pulling it off.  ?

## 2022-02-16 NOTE — ED Notes (Signed)
Pt c/o nausea.  

## 2022-02-16 NOTE — ED Notes (Signed)
Pt given lunch tray. Ate 50% ?

## 2022-02-17 MED ORDER — IBUPROFEN 600 MG PO TABS
600.0000 mg | ORAL_TABLET | Freq: Once | ORAL | Status: AC
  Administered 2022-02-17: 600 mg via ORAL
  Filled 2022-02-17: qty 1

## 2022-02-17 NOTE — ED Notes (Signed)
Patient incontinent of urine in bed. Peri-care performed and linens changed. Patient alert, oriented to name, able to state DOB and current year. Patient states it is the month of March and that he is at home. Patient re-oriented to correct place, time and situation. [name] redirectable and follows commands, cooperative with care. No distress noted at this time. ?

## 2022-02-17 NOTE — ED Notes (Signed)
Pt resting quietly with eyes closed

## 2022-02-17 NOTE — ED Notes (Signed)
Pt dinner tray sat up pt ate 75% of meal  ?

## 2022-02-17 NOTE — ED Notes (Signed)
Pt calling for a ride attempting to get out of bed. Pt bed alarm is still on  ? ?

## 2022-02-17 NOTE — ED Notes (Signed)
Patient attempting to get out of bed. Legs hanging over rail. Patient helped back in bed and upright. Patient confused and told this RN "I have a lot of missing money and I may need to go to the hospital." Patient made aware he is in hospital ?

## 2022-02-17 NOTE — ED Notes (Signed)
Pt is laying in bed with his head at foot of bed. However pt is not trying to get out of bed and is resting with eyes closed. Pt appears comfortable, bed alarm is on.  ?

## 2022-02-17 NOTE — TOC Progression Note (Signed)
Transition of Care (TOC) - Progression Note  ? ? ?Patient Details  ?Name: Douglas Peterson. ?MRN: [id number] ?Date of Birth: January 20, 1951 ? ?Transition of Care (TOC) CM/SW Contact  ?Shelbie Hutching, RN ?Phone Number: ?02/17/2022, 9:40 AM ? ?Clinical Narrative:    ? ?[employer] authorization is pending- need updated PT and OT notes - they should work with patient today. ? ?Expected Discharge Plan: [place] ?Barriers to Discharge: SNF Pending bed offer, ED SNF auth ? ?Expected Discharge Plan and Services ?Expected Discharge Plan: [place] ?  ?Discharge Planning Services: CM Consult ?Post Acute Care Choice: [place] ?Living arrangements for the past 2 months: [place] ?                ?DME Arranged: N/A ?DME Agency: NA ?  ?  ?  ?HH Arranged: NA ?[place] Agency: NA ?  ?  ?  ? ? ?Social Determinants of Health (SDOH) Interventions ?  ? ?Readmission Risk Interventions ? ?  08/23/2020  ? 11:52 AM 08/19/2020  ?  1:32 PM 08/14/2020  ?  9:43 AM  ?Readmission Risk Prevention Plan  ?Transportation Screening Complete Complete Complete  ?Medication Review Press photographer) Complete Complete   ?PCP or Specialist appointment within 3-5 days of discharge  Complete Complete  ?[place] or Home Care Consult Complete  Complete  ?SW Recovery Care/Counseling Consult  Complete Complete  ?Palliative Care Screening  Not Applicable Not Applicable  ?[place] Not Applicable  Patient Refused  ? ? ?

## 2022-02-17 NOTE — ED Provider Notes (Signed)
----------------------------------------- ?  7:40 AM on 02/17/2022 ?----------------------------------------- ? ? ?Blood pressure (!) 156/87, pulse 89, temperature 99.8 ?F (37.7 ?C), temperature source Oral, resp. rate 17, weight 63.3 kg, SpO2 95 %. ? ?The patient is calm and cooperative at this time.  There have been no acute events since the last update.  Awaiting disposition plan from [facility] team. ?  ?Hinda Kehr, MD ?02/17/22 0740 ? ?

## 2022-02-17 NOTE — Progress Notes (Signed)
Occupational Therapy Treatment ?Patient Details ?Name: Douglas Peterson. ?MRN: [id number] ?DOB: December 22, 1950 ?Today's Date: 02/17/2022 ? ? ?History of present illness 71yo male presenting with progressive weakness and reports of increased ETOH use. Pt also reporting diarrhea and incontinence. The pt reports fall, positive for hitting head. He presents with PMHx significant for HTN, atrial fibrillation, alcohol use disorder, TBI, pressure ulcer (location not noted), and chronic R spastic hemiplegia. ?  ?OT comments ? Pt seen for OT/PT co-treatment on this date. Upon arrival to bedside, pt awake and sitting upright in bed. Pt very motivated to participate in OT/PT tx and verbalizing goal to get dressed prior to OOB mobility. Pt currently requires MIN A for bed mobility, MIN A for seated UB dressing, MOD A+2 for sit>stand LB dressing, and MOD A+2 to take lateral steps toward [facility] with SPC d/t decreased strength, activity tolerance, and balance. Pt is making good progress toward goals and continues to benefit from skilled OT services to maximize return to PLOF and minimize risk of future falls, injury, caregiver burden, and readmission. Will continue to follow POC. Discharge recommendation remains appropriate.    ? ?Recommendations for follow up therapy are one component of a multi-disciplinary discharge planning process, led by the attending physician.  Recommendations may be updated based on patient status, additional functional criteria and insurance authorization. ?   ?Follow Up Recommendations ? Skilled nursing-short term rehab (<3 hours/day)  ?  ?Assistance Recommended at Discharge Frequent or constant Supervision/Assistance  ?Patient can return home with the following ? Assist for transportation;Assistance with cooking/housework;Direct supervision/assist for financial management;Help with stairs or ramp for entrance;Direct supervision/assist for medications management;Two people to help with walking and/or  transfers;Two people to help with bathing/dressing/bathroom ?  ?Equipment Recommendations ? Other (comment) (per next venue of care)  ?  ?   ?Precautions / Restrictions Precautions ?Precautions: Fall ?Restrictions ?Weight Bearing Restrictions: No  ? ? ?  ? ?Mobility Bed Mobility ?Overal bed mobility: Needs Assistance ?Bed Mobility: Supine to Sit, Sit to Supine ?  ?  ?Supine to sit: Min assist, HOB elevated ?Sit to supine: Min assist ?  ?  ?  ? ?Transfers ?Overall transfer level: Needs assistance ?Equipment used: Straight cane ?Transfers: Sit to/from Stand ?Sit to Stand: Mod assist, +2 physical assistance ?  ?  ?  ?  ?  ?General transfer comment: Frequent posterior lean in sitting and standing despite multimodal cues. ?  ?  ?Balance Overall balance assessment: History of Falls, Needs assistance ?Sitting-balance support: No upper extremity supported, Feet supported ?Sitting balance-Leahy Scale: Poor ?Sitting balance - Comments: posterior lean throughout. Able to intermittently maintain static sitting with good control with cuing ?Postural control: Posterior lean ?Standing balance support: During functional activity, Single extremity supported, Reliant on assistive device for balance ?Standing balance-Leahy Scale: Poor ?Standing balance comment: Posterior lean, crouched stance; inability to get torso over hips and knees to satnd upright with good stability ?  ?  ?  ?  ?  ?  ?  ?  ?  ?  ?  ?   ? ?ADL either performed or assessed with clinical judgement  ? ?ADL Overall ADL's : Needs assistance/impaired ?  ?  ?  ?  ?  ?  ?  ?  ?Upper Body Dressing : Minimal assistance;Sitting ?Upper Body Dressing Details (indicate cue type and reason): to don overhead shirt. Requires physical assist to initiate donning over R hand ?Lower Body Dressing: Moderate assistance;+2 for physical assistance;Sit to/from stand ?Lower Body Dressing Details (  indicate cue type and reason): While seated, pt able to pull shorts from calfs to thighs. Pt  required physical assist to don shorts over feet and +2 assist to don over hips in standing position. ?  ?  ?  ?  ?  ?  ?  ?  ?  ? ? ? ?Cognition Arousal/Alertness: Awake/alert ?Behavior During Therapy: [facility] for tasks assessed/performed ?Overall Cognitive Status: Difficult to assess ?Area of Impairment: Attention, Following commands, Safety/judgement, Awareness, Problem solving ?  ?  ?  ?  ?  ?  ?  ?  ?  ?Current Attention Level: Sustained ?  ?Following Commands: Follows one step commands consistently ?Safety/Judgement: Decreased awareness of safety ?Awareness: Emergent ?Problem Solving: Slow processing, Decreased initiation, Requires verbal cues ?General Comments: Difficutly to fully assess 2/2 mumbled speech however pt oriented to self, place, and situation. ?  ?  ?   ?   ?   ?   ? ? ?Pertinent Vitals/ Pain       Pain Assessment ?Pain Assessment: Faces ?Faces Pain Scale: Hurts a little bit ?Pain Location: head (laceration) ?Pain Descriptors / Indicators: Aching ?Pain Intervention(s): Monitored during session, Limited activity within patient's tolerance ? ? ?Frequency ? Min 2X/week  ? ? ? ? ?  ?Progress Toward Goals ? ?OT Goals(current goals can now be found in the care plan section) ? Progress towards OT goals: Progressing toward goals ? ?Acute Rehab OT Goals ?Patient Stated Goal: to go home ?OT Goal Formulation: With patient ?Time For Goal Achievement: 02/28/22 ?Potential to Achieve Goals: Fair  ?Plan Discharge plan remains appropriate;Frequency remains appropriate   ? ?Co-evaluation ? ? ? PT/OT/SLP Co-Evaluation/Treatment: Yes ?Reason for Co-Treatment: Complexity of the patient's impairments (multi-system involvement);For patient/therapist safety;To address functional/ADL transfers ?PT goals addressed during session: Mobility/safety with mobility;Balance ?OT goals addressed during session: ADL's and self-care ?  ? ?  ?AM-PAC OT "6 Clicks" Daily Activity     ?Outcome Measure ? ? Help from another person eating  meals?: None ?Help from another person taking care of personal grooming?: A Little ?Help from another person toileting, which includes using toliet, bedpan, or urinal?: A Lot ?Help from another person bathing (including washing, rinsing, drying)?: A Lot ?Help from another person to put on and taking off regular upper body clothing?: A Little ?Help from another person to put on and taking off regular lower body clothing?: A Lot ?6 Click Score: 16 ? ?  ?End of Session Equipment Utilized During Treatment: Gait belt;Other (comment) (SPC) ? ?OT Visit Diagnosis: History of falling (Z91.81);Unsteadiness on feet (R26.81);Muscle weakness (generalized) (M62.81) ?  ?Activity Tolerance Patient tolerated treatment well ?  ?Patient Left in bed;with call bell/phone within reach;with bed alarm set ?  ?Nurse Communication Mobility status ?  ? ?   ? ?Time: 1638-4665 ?OT Time Calculation (min): 31 min ? ?Charges: OT General Charges ?$OT Visit: 1 Visit ?OT Treatments ?$Self Care/Home Management : 8-22 mins ? ?Fredirick Maudlin, OTR/L ?[place] ? ?

## 2022-02-17 NOTE — Progress Notes (Signed)
Physical Therapy Treatment ?Patient Details ?Name: Douglas Peterson. ?MRN: [id number] ?DOB: [birth year] ?Today's Date: 02/17/2022 ? ? ?History of Present Illness 71yo male presenting with progressive weakness and reports of increased ETOH use. Pt also reporting diarrhea and incontinence. The pt reports fall, positive for hitting head. He presents with PMHx significant for HTN, atrial fibrillation, alcohol use disorder, TBI, pressure ulcer (location not noted), and chronic R spastic hemiplegia. ? ?  ?PT Comments  ? ? Pt received upright in bed agreeable to PT/OT co-treat. Pt requiring minA for bed mobility with increased time to perform. Pt displaying intermittent need for min to mod multimodal cues for upright posture with PT/OT tasks. OT treatment performed witting EOB for dressing ADL's. X3 STS attempts made for strength and assistance in LE dressing and catheter positioning with pt requiring modA +2 with max multimodal cues for attempting to correct posterior lean due to inability for pt to fully extend at hips and knees to stand upright with SPC.  Seated LE therex performed b/t standing bouts with good understanding of exercises. On last STS attempt, modA+2 to take 3-4 L side steps to reach [facility] with pt requiring minA+1 to return to supine in bed. All needs in reach with d/c recs remaining appropriate.  ?  ?Recommendations for follow up therapy are one component of a multi-disciplinary discharge planning process, led by the attending physician.  Recommendations may be updated based on patient status, additional functional criteria and insurance authorization. ? ?Follow Up Recommendations ? Skilled nursing-short term rehab (<3 hours/day) ?  ?  ?Assistance Recommended at Discharge Frequent or constant Supervision/Assistance  ?Patient can return home with the following A lot of help with walking and/or transfers;A lot of help with bathing/dressing/bathroom;Help with stairs or ramp for entrance ?  ?Equipment  Recommendations ?    ?  ?Recommendations for Other Services   ? ? ?  ?Precautions / Restrictions Precautions ?Precautions: Fall ?Restrictions ?Weight Bearing Restrictions: No  ?  ? ?Mobility ? Bed Mobility ?Overal bed mobility: Needs Assistance ?Bed Mobility: Supine to Sit, Sit to Supine ?  ?  ?Supine to sit: Min assist, HOB elevated ?Sit to supine: Min assist ?  ?  ?Patient Response: Cooperative ? ?Transfers ?Overall transfer level: Needs assistance ?Equipment used: Straight cane ?Transfers: Sit to/from Stand ?Sit to Stand: Mod assist, +2 physical assistance ?  ?  ?  ?  ?  ?General transfer comment: Frequent posterior lean in sitting and standing despite multimodal cues. ?  ? ?Ambulation/Gait ?  ?  ?  ?  ?  ?  ?  ?General Gait Details: unable to safely trial due to poor standing balance ? ? ?Stairs ?  ?  ?  ?  ?  ? ? ?Wheelchair Mobility ?  ? ?Modified Rankin (Stroke Patients Only) ?  ? ? ?  ?Balance Overall balance assessment: History of Falls, Needs assistance ?Sitting-balance support: Feet supported, Single extremity supported ?Sitting balance-Leahy Scale: Poor ?Sitting balance - Comments: posterior lean throughout. Able to intermittently maintain static sitting with good control with cuing ?Postural control: Posterior lean ?Standing balance support: During functional activity, Single extremity supported, Reliant on assistive device for balance ?Standing balance-Leahy Scale: Poor ?Standing balance comment: Posterior lean, crouched stance; inability to get torso over hips and knees to satnd upright with good stability ?  ?  ?  ?  ?  ?  ?  ?  ?  ?  ?  ?  ? ?  ?Cognition Arousal/Alertness: Awake/alert ?Behavior During Therapy:  WFL for tasks assessed/performed ?Overall Cognitive Status: Difficult to assess ?  ?  ?  ?  ?  ?  ?  ?  ?  ?  ?  ?  ?Following Commands: Follows one step commands consistently ?Safety/Judgement: Decreased awareness of safety ?  ?Problem Solving: Slow processing, Decreased initiation, Requires  verbal cues ?  ?  ?  ? ?  ?Exercises General Exercises - Lower Extremity ?Ankle Circles/Pumps: AROM, Strengthening, Both, 10 reps, Supine ?Long Arc Quad: AROM, Both, Supine, 10 reps, Seated ?Toe Raises: AROM, Seated, Both, 10 reps, Supine ? ?  ?General Comments   ?  ?  ? ?Pertinent Vitals/Pain Pain Assessment ?Pain Assessment: No/denies pain  ? ? ?Home Living   ?  ?  ?  ?  ?  ?  ?  ?  ?  ?   ?  ?Prior Function    ?  ?  ?   ? ?PT Goals (current goals can now be found in the care plan section) Acute Rehab PT Goals ?Patient Stated Goal: To get better and return home ?PT Goal Formulation: With patient ?Time For Goal Achievement: 02/28/22 ?Potential to Achieve Goals: Fair ?Progress towards PT goals: Progressing toward goals ? ?  ?Frequency ? ? ? Min 2X/week ? ? ? ?  ?PT Plan Current plan remains appropriate  ? ? ?Co-evaluation PT/OT/SLP Co-Evaluation/Treatment: Yes ?Reason for Co-Treatment: Complexity of the patient's impairments (multi-system involvement);Necessary to address cognition/behavior during functional activity;For patient/therapist safety ?PT goals addressed during session: Mobility/safety with mobility;Balance;Proper use of DME;Strengthening/ROM ?OT goals addressed during session: ADL's and self-care;Proper use of Adaptive equipment and DME ?  ? ?  ?AM-PAC PT "6 Clicks" Mobility   ?Outcome Measure ? Help needed turning from your back to your side while in a flat bed without using bedrails?: A Little ?Help needed moving from lying on your back to sitting on the side of a flat bed without using bedrails?: A Lot ?Help needed moving to and from a bed to a chair (including a wheelchair)?: A Lot ?Help needed standing up from a chair using your arms (e.g., wheelchair or bedside chair)?: A Lot ?Help needed to walk in hospital room?: Total ?Help needed climbing 3-5 steps with a railing? : Total ?6 Click Score: 11 ? ?  ?End of Session Equipment Utilized During Treatment: Gait belt ?Activity Tolerance: Patient  tolerated treatment well ?Patient left: in bed;with call bell/phone within reach;with bed alarm set ?Nurse Communication: Mobility status ?PT Visit Diagnosis: Unsteadiness on feet (R26.81);History of falling (Z91.81);Difficulty in walking, not elsewhere classified (R26.2);Muscle weakness (generalized) (M62.81);Pain ?  ? ? ?Time: 6754-4920 ?PT Time Calculation (min) (ACUTE ONLY): 31 min ? ?Charges:  $Therapeutic Exercise: 8-22 mins          ?          ? ?Salem Caster. Fairly IV, PT, DPT ?Physical Therapist- [place]  ?[facility]  ?02/17/2022, 10:59 AM ? ?

## 2022-02-17 NOTE — ED Notes (Signed)
Pt lunch tray set up pt ate 50% of lunch tray.  ?

## 2022-02-17 NOTE — ED Notes (Signed)
Patient set up and eating breakfast at this time ?

## 2022-02-17 NOTE — ED Notes (Addendum)
Patient asked if he would like to be repositioned in bed, patient denied and reported he is comfortable. Patient asking about PT consult. This RN made patient aware that PT does not arrived until 8am and will call when they are at work to confirm PT will be working with him today ?

## 2022-02-17 NOTE — ED Notes (Signed)
Pt trying to get out of bed and leave. Pt bed alarms on and pt in eye sight of desk  ? ?

## 2022-02-17 NOTE — ED Notes (Signed)
Patient resting quietly in bed with eyes closed. Resp even, unlabored on RA. Appears sleeping. Bed in low position with siderails up and bed alarm on. No distress noted at this time.  ?

## 2022-02-17 NOTE — ED Notes (Signed)
Pt given a sandwich tray ?

## 2022-02-17 NOTE — ED Notes (Signed)
PT/OT at bedside with patient ?

## 2022-02-18 MED ORDER — ATORVASTATIN CALCIUM 20 MG PO TABS
10.0000 mg | ORAL_TABLET | Freq: Every day | ORAL | Status: DC
  Administered 2022-02-18: 10 mg via ORAL
  Filled 2022-02-18: qty 1

## 2022-02-18 MED ORDER — ASPIRIN 81 MG PO CHEW
81.0000 mg | CHEWABLE_TABLET | Freq: Every day | ORAL | Status: DC
  Administered 2022-02-18: 81 mg via ORAL
  Filled 2022-02-18: qty 1

## 2022-02-18 MED ORDER — ATENOLOL 25 MG PO TABS
25.0000 mg | ORAL_TABLET | Freq: Two times a day (BID) | ORAL | Status: DC
  Administered 2022-02-18 (×2): 25 mg via ORAL
  Filled 2022-02-18 (×2): qty 1

## 2022-02-18 MED ORDER — PAROXETINE HCL 30 MG PO TABS
30.0000 mg | ORAL_TABLET | Freq: Every day | ORAL | Status: DC
  Administered 2022-02-18: 30 mg via ORAL
  Filled 2022-02-18: qty 1

## 2022-02-18 MED ORDER — PANTOPRAZOLE SODIUM 40 MG PO TBEC
40.0000 mg | DELAYED_RELEASE_TABLET | Freq: Every day | ORAL | Status: DC
  Administered 2022-02-18: 40 mg via ORAL
  Filled 2022-02-18: qty 1

## 2022-02-18 MED ORDER — TAMSULOSIN HCL 0.4 MG PO CAPS
0.4000 mg | ORAL_CAPSULE | Freq: Every day | ORAL | Status: DC
  Administered 2022-02-18: 0.4 mg via ORAL
  Filled 2022-02-18: qty 1

## 2022-02-18 NOTE — ED Provider Notes (Signed)
Pt accepted to SNF. He has been HDS throughout his stay. No signs of active withdrawal. Discharged. ?  ?Duffy Bruce, MD ?02/18/22 1829 ? ?

## 2022-02-18 NOTE — ED Notes (Signed)
[facility]  CALLED FOR   TRANSPORT  TO  [facility] ?

## 2022-02-18 NOTE — TOC Transition Note (Signed)
Transition of Care (TOC) - CM/SW Discharge Note ? ? ?Patient Details  ?Name: Douglas Peterson. ?MRN: [id number] ?Date of Birth: 1951-03-30 ? ?Transition of Care (TOC) CM/SW Contact:  ?Shelbie Hutching, RN ?Phone Number: ?02/18/2022, 1:14 PM ? ? ?Clinical Narrative:    ?[name] has approved authorization for SNF.  Patient will go to [facility] today room 603B, ED RN will call report to [phone].  ED secretary will set up EMS transport.   ? ? ?Final next level of care: [place] ?Barriers to Discharge: Barriers Resolved ? ? ?Patient Goals and CMS Choice ?Patient states their goals for this hospitalization and ongoing recovery are:: Patient agrees to go to short term rehab ?CMS Medicare.gov Compare Post Acute Care list provided to:: Patient ?Choice offered to / list presented to : Patient ? ?Discharge Placement ?  ?           ?Patient chooses bed at: [facility] [place] ?Patient to be transferred to facility by: [facility] ?Name of family member notified: patient will notify ?Patient and family notified of of transfer: 02/18/22 ? ?Discharge Plan and Services ?  ?Discharge Planning Services: CM Consult ?Post Acute Care Choice: [place]          ?DME Arranged: N/A ?DME Agency: NA ?  ?  ?  ?HH Arranged: NA ?[place] Agency: NA ?  ?  ?  ? ?Social Determinants of Health (SDOH) Interventions ?  ? ? ?Readmission Risk Interventions ? ?  08/23/2020  ? 11:52 AM 08/19/2020  ?  1:32 PM 08/14/2020  ?  9:43 AM  ?Readmission Risk Prevention Plan  ?Transportation Screening Complete Complete Complete  ?Medication Review Press photographer) Complete Complete   ?PCP or Specialist appointment within 3-5 days of discharge  Complete Complete  ?[place] or Home Care Consult Complete  Complete  ?SW Recovery Care/Counseling Consult  Complete Complete  ?Palliative Care Screening  Not Applicable Not Applicable  ?[place] Not Applicable  Patient Refused  ? ? ? ? ? ?

## 2022-02-18 NOTE — ED Notes (Signed)
Incontinent of urine and stool. Peri care performed and dry brief applied. Repositioned for comfort. No distress noted at this time. ?

## 2022-02-18 NOTE — ED Notes (Signed)
Breakfast tray setup. Pt sitting on side of bed. ?

## 2022-02-18 NOTE — ED Notes (Signed)
Pt assisted to bed side commode, pt is x2 person max assist. Student RN provided perineal care, replaced male purewick, and placed bed alarm on.  ?

## 2022-02-18 NOTE — TOC Progression Note (Signed)
Transition of Care (TOC) - Progression Note  ? ? ?Patient Details  ?Name: Douglas Peterson. ?MRN: [id number] ?Date of Birth: [birth year] ? ?Transition of Care (TOC) CM/SW Contact  ?Shelbie Hutching, RN ?Phone Number: ?02/18/2022, 12:48 PM ? ?Clinical Narrative:    ?Called and checked in with [facility], insurance Josem Kaufmann is still pending.   ? ? ?Expected Discharge Plan: [place] ?Barriers to Discharge: SNF Pending bed offer, ED SNF auth ? ?Expected Discharge Plan and Services ?Expected Discharge Plan: [place] ?  ?Discharge Planning Services: CM Consult ?Post Acute Care Choice: [place] ?Living arrangements for the past 2 months: [place] ?                ?DME Arranged: N/A ?DME Agency: NA ?  ?  ?  ?HH Arranged: NA ?[place] Agency: NA ?  ?  ?  ? ? ?Social Determinants of Health (SDOH) Interventions ?  ? ?Readmission Risk Interventions ? ?  08/23/2020  ? 11:52 AM 08/19/2020  ?  1:32 PM 08/14/2020  ?  9:43 AM  ?Readmission Risk Prevention Plan  ?Transportation Screening Complete Complete Complete  ?Medication Review Press photographer) Complete Complete   ?PCP or Specialist appointment within 3-5 days of discharge  Complete Complete  ?[place] or Home Care Consult Complete  Complete  ?SW Recovery Care/Counseling Consult  Complete Complete  ?Palliative Care Screening  Not Applicable Not Applicable  ?[place] Not Applicable  Patient Refused  ? ? ?

## 2022-02-18 NOTE — ED Provider Notes (Signed)
Today's Vitals  ? 02/17/22 0400 02/17/22 0839 02/18/22 0000 02/18/22 0002  ?BP:  (!) 163/96 (!) 158/96 (!) 158/96  ?Pulse:  88 72 72  ?Resp:  16 16   ?Temp:  98.6 ?F (37 ?C) 98 ?F (36.7 ?C)   ?TempSrc:  Oral Oral   ?SpO2:  96% 96%   ?Weight:      ?PainSc: 7      ? ?Body mass index is 24.72 kg/m?. ? ? ? ?Patient resting comfortably.  No acute complaints.  Awaiting social work disposition. ?  ?, Delice Bison, DO ?02/18/22 8069 ? ?

## 2022-02-19 ENCOUNTER — Other Ambulatory Visit: Payer: Self-pay

## 2022-02-19 ENCOUNTER — Emergency Department: Payer: Medicare HMO

## 2022-02-19 ENCOUNTER — Observation Stay
Admission: EM | Admit: 2022-02-19 | Discharge: 2022-02-24 | Disposition: A | Discharge status: Skilled Nursing Facility | Payer: Medicare HMO | Attending: Internal Medicine | Admitting: Internal Medicine

## 2022-02-19 DIAGNOSIS — D649 Anemia, unspecified: Secondary | ICD-10-CM | POA: Insufficient documentation

## 2022-02-19 DIAGNOSIS — G319 Degenerative disease of nervous system, unspecified: Secondary | ICD-10-CM | POA: Insufficient documentation

## 2022-02-19 DIAGNOSIS — I480 Paroxysmal atrial fibrillation: Secondary | ICD-10-CM | POA: Diagnosis not present

## 2022-02-19 DIAGNOSIS — R1313 Dysphagia, pharyngeal phase: Secondary | ICD-10-CM | POA: Insufficient documentation

## 2022-02-19 DIAGNOSIS — F1011 Alcohol abuse, in remission: Secondary | ICD-10-CM | POA: Insufficient documentation

## 2022-02-19 DIAGNOSIS — Z7982 Long term (current) use of aspirin: Secondary | ICD-10-CM | POA: Diagnosis not present

## 2022-02-19 DIAGNOSIS — Z20822 Contact with and (suspected) exposure to covid-19: Secondary | ICD-10-CM | POA: Diagnosis not present

## 2022-02-19 DIAGNOSIS — E860 Dehydration: Secondary | ICD-10-CM | POA: Insufficient documentation

## 2022-02-19 DIAGNOSIS — Z79899 Other long term (current) drug therapy: Secondary | ICD-10-CM | POA: Insufficient documentation

## 2022-02-19 DIAGNOSIS — R296 Repeated falls: Secondary | ICD-10-CM | POA: Diagnosis not present

## 2022-02-19 DIAGNOSIS — R490 Dysphonia: Secondary | ICD-10-CM | POA: Insufficient documentation

## 2022-02-19 DIAGNOSIS — E871 Hypo-osmolality and hyponatremia: Secondary | ICD-10-CM | POA: Diagnosis not present

## 2022-02-19 DIAGNOSIS — I959 Hypotension, unspecified: Principal | ICD-10-CM

## 2022-02-19 DIAGNOSIS — R7401 Elevation of levels of liver transaminase levels: Secondary | ICD-10-CM | POA: Insufficient documentation

## 2022-02-19 DIAGNOSIS — Z8249 Family history of ischemic heart disease and other diseases of the circulatory system: Secondary | ICD-10-CM | POA: Insufficient documentation

## 2022-02-19 DIAGNOSIS — R4182 Altered mental status, unspecified: Secondary | ICD-10-CM | POA: Diagnosis not present

## 2022-02-19 DIAGNOSIS — Z9181 History of falling: Secondary | ICD-10-CM | POA: Diagnosis not present

## 2022-02-19 DIAGNOSIS — K219 Gastro-esophageal reflux disease without esophagitis: Secondary | ICD-10-CM | POA: Insufficient documentation

## 2022-02-19 DIAGNOSIS — I6782 Cerebral ischemia: Secondary | ICD-10-CM | POA: Insufficient documentation

## 2022-02-19 DIAGNOSIS — R9431 Abnormal electrocardiogram [ECG] [EKG]: Secondary | ICD-10-CM | POA: Insufficient documentation

## 2022-02-19 DIAGNOSIS — R945 Abnormal results of liver function studies: Secondary | ICD-10-CM | POA: Insufficient documentation

## 2022-02-19 DIAGNOSIS — R7989 Other specified abnormal findings of blood chemistry: Secondary | ICD-10-CM | POA: Insufficient documentation

## 2022-02-19 DIAGNOSIS — R52 Pain, unspecified: Secondary | ICD-10-CM | POA: Insufficient documentation

## 2022-02-19 DIAGNOSIS — K7682 Hepatic encephalopathy: Secondary | ICD-10-CM | POA: Diagnosis not present

## 2022-02-19 DIAGNOSIS — R918 Other nonspecific abnormal finding of lung field: Secondary | ICD-10-CM | POA: Diagnosis not present

## 2022-02-19 DIAGNOSIS — R2681 Unsteadiness on feet: Secondary | ICD-10-CM | POA: Insufficient documentation

## 2022-02-19 DIAGNOSIS — R001 Bradycardia, unspecified: Secondary | ICD-10-CM | POA: Diagnosis not present

## 2022-02-19 DIAGNOSIS — F32A Depression, unspecified: Secondary | ICD-10-CM | POA: Insufficient documentation

## 2022-02-19 DIAGNOSIS — R0602 Shortness of breath: Secondary | ICD-10-CM | POA: Diagnosis not present

## 2022-02-19 DIAGNOSIS — R251 Tremor, unspecified: Secondary | ICD-10-CM | POA: Insufficient documentation

## 2022-02-19 DIAGNOSIS — K760 Fatty (change of) liver, not elsewhere classified: Secondary | ICD-10-CM | POA: Insufficient documentation

## 2022-02-19 DIAGNOSIS — J9811 Atelectasis: Secondary | ICD-10-CM | POA: Diagnosis not present

## 2022-02-19 DIAGNOSIS — I9589 Other hypotension: Secondary | ICD-10-CM | POA: Diagnosis not present

## 2022-02-19 DIAGNOSIS — E861 Hypovolemia: Secondary | ICD-10-CM | POA: Diagnosis not present

## 2022-02-19 DIAGNOSIS — R54 Age-related physical debility: Secondary | ICD-10-CM | POA: Insufficient documentation

## 2022-02-19 DIAGNOSIS — R040 Epistaxis: Secondary | ICD-10-CM | POA: Diagnosis not present

## 2022-02-19 DIAGNOSIS — J984 Other disorders of lung: Secondary | ICD-10-CM | POA: Insufficient documentation

## 2022-02-19 DIAGNOSIS — I69351 Hemiplegia and hemiparesis following cerebral infarction affecting right dominant side: Secondary | ICD-10-CM | POA: Insufficient documentation

## 2022-02-19 DIAGNOSIS — M6281 Muscle weakness (generalized): Secondary | ICD-10-CM | POA: Insufficient documentation

## 2022-02-19 DIAGNOSIS — I10 Essential (primary) hypertension: Secondary | ICD-10-CM | POA: Insufficient documentation

## 2022-02-19 DIAGNOSIS — Z8673 Personal history of transient ischemic attack (TIA), and cerebral infarction without residual deficits: Secondary | ICD-10-CM | POA: Insufficient documentation

## 2022-02-19 DIAGNOSIS — K4090 Unilateral inguinal hernia, without obstruction or gangrene, not specified as recurrent: Secondary | ICD-10-CM | POA: Insufficient documentation

## 2022-02-19 DIAGNOSIS — A419 Sepsis, unspecified organism: Secondary | ICD-10-CM | POA: Insufficient documentation

## 2022-02-19 DIAGNOSIS — M47812 Spondylosis without myelopathy or radiculopathy, cervical region: Secondary | ICD-10-CM | POA: Insufficient documentation

## 2022-02-19 DIAGNOSIS — R531 Weakness: Secondary | ICD-10-CM

## 2022-02-19 LAB — COMPREHENSIVE METABOLIC PANEL
ALT: 102 U/L — ABNORMAL HIGH (ref 0–44)
AST: 89 U/L — ABNORMAL HIGH (ref 15–41)
Albumin: 3 g/dL — ABNORMAL LOW (ref 3.5–5.0)
Alkaline Phosphatase: 70 U/L (ref 38–126)
Anion gap: 8 (ref 5–15)
BUN: 18 mg/dL (ref 8–23)
CO2: 23 mmol/L (ref 22–32)
Calcium: 8.2 mg/dL — ABNORMAL LOW (ref 8.9–10.3)
Chloride: 105 mmol/L (ref 98–111)
Creatinine, Ser: 0.76 mg/dL (ref 0.61–1.24)
GFR, Estimated: >60 mL/min (ref >60)
Glucose, Bld: 106 mg/dL — ABNORMAL HIGH (ref 70–99)
Potassium: 3.8 mmol/L (ref 3.5–5.1)
Sodium: 136 mmol/L (ref 135–145)
Total Bilirubin: 0.6 mg/dL (ref 0.3–1.2)
Total Protein: 5.5 g/dL — ABNORMAL LOW (ref 6.5–8.1)

## 2022-02-19 LAB — URINALYSIS, COMPLETE (UACMP) WITH MICROSCOPIC
Bacteria, UA: NONE SEEN
Bilirubin Urine: NEGATIVE
Glucose, UA: NEGATIVE mg/dL
Hgb urine dipstick: NEGATIVE
Ketones, ur: NEGATIVE mg/dL
Leukocytes,Ua: NEGATIVE
Nitrite: NEGATIVE
Protein, ur: NEGATIVE mg/dL
Specific Gravity, Urine: 1.01 (ref 1.005–1.030)
pH: 8 (ref 5.0–8.0)

## 2022-02-19 LAB — CBC WITH DIFFERENTIAL/PLATELET
Abs Immature Granulocytes: 0.03 10*3/uL (ref 0.00–0.07)
Basophils Absolute: 0 10*3/uL (ref 0.0–0.1)
Basophils Relative: 1 %
Eosinophils Absolute: 0.2 10*3/uL (ref 0.0–0.5)
Eosinophils Relative: 4 %
HCT: 37.4 % — ABNORMAL LOW (ref 39.0–52.0)
Hemoglobin: 12.4 g/dL — ABNORMAL LOW (ref 13.0–17.0)
Immature Granulocytes: 1 %
Lymphocytes Relative: 36 %
Lymphs Abs: 2 10*3/uL (ref 0.7–4.0)
MCH: 31.6 pg (ref 26.0–34.0)
MCHC: 33.2 g/dL (ref 30.0–36.0)
MCV: 95.2 fL (ref 80.0–100.0)
Monocytes Absolute: 0.8 10*3/uL (ref 0.1–1.0)
Monocytes Relative: 14 %
Neutro Abs: 2.6 10*3/uL (ref 1.7–7.7)
Neutrophils Relative %: 44 %
Platelets: 111 10*3/uL — ABNORMAL LOW (ref 150–400)
RBC: 3.93 MIL/uL — ABNORMAL LOW (ref 4.22–5.81)
RDW: 12.5 % (ref 11.5–15.5)
WBC: 5.6 10*3/uL (ref 4.0–10.5)
nRBC: 0 % (ref 0.0–0.2)

## 2022-02-19 LAB — IRON AND TIBC
Iron: 81 ug/dL (ref 45–182)
Saturation Ratios: 39 % (ref 17.9–39.5)
TIBC: 206 ug/dL — ABNORMAL LOW (ref 250–450)
UIBC: 125 ug/dL

## 2022-02-19 LAB — APTT: aPTT: 32 seconds (ref 24–36)

## 2022-02-19 LAB — RESP PANEL BY RT-PCR (FLU A&B, COVID) ARPGX2
Influenza A by PCR: NEGATIVE
Influenza B by PCR: NEGATIVE
SARS Coronavirus 2 by RT PCR: NEGATIVE

## 2022-02-19 LAB — PROTIME-INR
INR: 1.1 (ref 0.8–1.2)
Prothrombin Time: 14.4 seconds (ref 11.4–15.2)

## 2022-02-19 LAB — AMMONIA: Ammonia: 50 umol/L — ABNORMAL HIGH (ref 9–35)

## 2022-02-19 LAB — TROPONIN I (HIGH SENSITIVITY): Troponin I (High Sensitivity): 5 ng/L (ref <18)

## 2022-02-19 LAB — TSH: TSH: 3.283 u[IU]/mL (ref 0.350–4.500)

## 2022-02-19 LAB — LACTIC ACID, PLASMA
Lactic Acid, Venous: 1.4 mmol/L (ref 0.5–1.9)
Lactic Acid, Venous: 1.5 mmol/L (ref 0.5–1.9)

## 2022-02-19 LAB — PROCALCITONIN: Procalcitonin: <0.1 ng/mL

## 2022-02-19 LAB — ETHANOL: Alcohol, Ethyl (B): <10 mg/dL (ref <10)

## 2022-02-19 MED ORDER — LACTULOSE 10 GM/15ML PO SOLN
30.0000 g | Freq: Once | ORAL | Status: DC
  Filled 2022-02-19: qty 60

## 2022-02-19 MED ORDER — LACTATED RINGERS IV BOLUS
1000.0000 mL | Freq: Once | INTRAVENOUS | Status: AC
  Administered 2022-02-19: 1000 mL via INTRAVENOUS

## 2022-02-19 MED ORDER — ENOXAPARIN SODIUM 40 MG/0.4ML IJ SOSY: 40.0000 mg | PREFILLED_SYRINGE | INTRAMUSCULAR | Status: DC

## 2022-02-19 MED ORDER — LACTATED RINGERS IV SOLN: INTRAVENOUS | Status: DC

## 2022-02-19 MED ORDER — IOHEXOL 300 MG/ML  SOLN
100.0000 mL | Freq: Once | INTRAMUSCULAR | Status: AC | PRN
  Administered 2022-02-19: 75 mL via INTRAVENOUS
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
## 2022-02-19 NOTE — ED Triage Notes (Signed)
Pt from [facility]. Per staff pt may be having alcohol withdrawal. Pt found with decreased LOC by staff and weakness. BP 68/40 for EMS. Pt has hx of stroke with R sided deficits  ?

## 2022-02-20 ENCOUNTER — Other Ambulatory Visit: Payer: Self-pay

## 2022-02-20 ENCOUNTER — Encounter: Payer: Self-pay | Admitting: Internal Medicine

## 2022-02-20 DIAGNOSIS — I959 Hypotension, unspecified: Secondary | ICD-10-CM

## 2022-02-20 DIAGNOSIS — R4182 Altered mental status, unspecified: Secondary | ICD-10-CM | POA: Diagnosis present

## 2022-02-20 LAB — COMPREHENSIVE METABOLIC PANEL
ALT: 104 U/L — ABNORMAL HIGH (ref 0–44)
AST: 80 U/L — ABNORMAL HIGH (ref 15–41)
Albumin: 3.3 g/dL — ABNORMAL LOW (ref 3.5–5.0)
Alkaline Phosphatase: 70 U/L (ref 38–126)
Anion gap: 4 — ABNORMAL LOW (ref 5–15)
BUN: 10 mg/dL (ref 8–23)
CO2: 26 mmol/L (ref 22–32)
Calcium: 8.1 mg/dL — ABNORMAL LOW (ref 8.9–10.3)
Chloride: 105 mmol/L (ref 98–111)
Creatinine, Ser: 0.61 mg/dL (ref 0.61–1.24)
GFR, Estimated: >60 mL/min (ref >60)
Glucose, Bld: 74 mg/dL (ref 70–99)
Potassium: 3.6 mmol/L (ref 3.5–5.1)
Sodium: 135 mmol/L (ref 135–145)
Total Bilirubin: 0.7 mg/dL (ref 0.3–1.2)
Total Protein: 6 g/dL — ABNORMAL LOW (ref 6.5–8.1)

## 2022-02-20 LAB — CBC
HCT: 40 % (ref 39.0–52.0)
Hemoglobin: 13.4 g/dL (ref 13.0–17.0)
MCH: 32.1 pg (ref 26.0–34.0)
MCHC: 33.5 g/dL (ref 30.0–36.0)
MCV: 95.7 fL (ref 80.0–100.0)
Platelets: 98 10*3/uL — ABNORMAL LOW (ref 150–400)
RBC: 4.18 MIL/uL — ABNORMAL LOW (ref 4.22–5.81)
RDW: 12.8 % (ref 11.5–15.5)
WBC: 4.9 10*3/uL (ref 4.0–10.5)
nRBC: 0 % (ref 0.0–0.2)

## 2022-02-20 LAB — URINE CULTURE: Culture: NO GROWTH

## 2022-02-20 LAB — TROPONIN I (HIGH SENSITIVITY): Troponin I (High Sensitivity): 7 ng/L (ref <18)

## 2022-02-20 LAB — AMMONIA: Ammonia: 28 umol/L (ref 9–35)

## 2022-02-20 MED ORDER — ADULT MULTIVITAMIN W/MINERALS CH
1.0000 | ORAL_TABLET | Freq: Every day | ORAL | Status: DC
  Administered 2022-02-20 – 2022-02-24 (×5): 1 via ORAL
  Filled 2022-02-20 (×5): qty 1

## 2022-02-20 MED ORDER — ATENOLOL 25 MG PO TABS
25.0000 mg | ORAL_TABLET | Freq: Two times a day (BID) | ORAL | Status: DC
  Administered 2022-02-20 – 2022-02-24 (×9): 25 mg via ORAL
  Filled 2022-02-20 (×9): qty 1

## 2022-02-20 MED ORDER — FOLIC ACID 1 MG PO TABS
1.0000 mg | ORAL_TABLET | Freq: Every day | ORAL | Status: DC
  Administered 2022-02-20 – 2022-02-24 (×5): 1 mg via ORAL
  Filled 2022-02-20 (×5): qty 1

## 2022-02-20 MED ORDER — HYDRALAZINE HCL 20 MG/ML IJ SOLN: 10.0000 mg | INTRAMUSCULAR | Status: DC | PRN

## 2022-02-20 MED ORDER — ASCORBIC ACID 500 MG PO TABS
1000.0000 mg | ORAL_TABLET | Freq: Every day | ORAL | Status: DC
  Administered 2022-02-20 – 2022-02-24 (×5): 1000 mg via ORAL
  Filled 2022-02-20 (×5): qty 2

## 2022-02-20 MED ORDER — THIAMINE HCL 100 MG PO TABS
100.0000 mg | ORAL_TABLET | Freq: Every day | ORAL | Status: DC
  Administered 2022-02-20 – 2022-02-24 (×5): 100 mg via ORAL
  Filled 2022-02-20 (×5): qty 1

## 2022-02-20 MED ORDER — SENNOSIDES-DOCUSATE SODIUM 8.6-50 MG PO TABS
2.0000 | ORAL_TABLET | Freq: Every day | ORAL | Status: DC
  Administered 2022-02-20 – 2022-02-23 (×4): 2 via ORAL
  Filled 2022-02-20 (×4): qty 2

## 2022-02-20 MED ORDER — ENSURE ENLIVE PO LIQD
237.0000 mL | Freq: Two times a day (BID) | ORAL | Status: DC
  Administered 2022-02-21 – 2022-02-23 (×4): 237 mL via ORAL

## 2022-02-20 MED ORDER — ATORVASTATIN CALCIUM 10 MG PO TABS
10.0000 mg | ORAL_TABLET | Freq: Every day | ORAL | Status: DC
  Administered 2022-02-20 – 2022-02-24 (×5): 10 mg via ORAL
  Filled 2022-02-20 (×5): qty 1

## 2022-02-20 MED ORDER — PANTOPRAZOLE SODIUM 40 MG PO TBEC
40.0000 mg | DELAYED_RELEASE_TABLET | Freq: Every day | ORAL | Status: DC
  Administered 2022-02-20 – 2022-02-23 (×4): 40 mg via ORAL
  Filled 2022-02-20 (×4): qty 1

## 2022-02-20 MED ORDER — PAROXETINE HCL 30 MG PO TABS
30.0000 mg | ORAL_TABLET | Freq: Every day | ORAL | Status: DC
  Administered 2022-02-20 – 2022-02-24 (×5): 30 mg via ORAL
  Filled 2022-02-20 (×5): qty 1

## 2022-02-20 MED ORDER — ONDANSETRON HCL 4 MG PO TABS
4.0000 mg | ORAL_TABLET | Freq: Three times a day (TID) | ORAL | Status: DC | PRN
  Administered 2022-02-23: 4 mg via ORAL
  Filled 2022-02-20: qty 1

## 2022-02-20 MED ORDER — VITAMIN D 25 MCG (1000 UNIT) PO TABS
5000.0000 [IU] | ORAL_TABLET | Freq: Every day | ORAL | Status: DC
  Administered 2022-02-20 – 2022-02-24 (×5): 5000 [IU] via ORAL
  Filled 2022-02-20 (×5): qty 5

## 2022-02-20 MED ORDER — TAMSULOSIN HCL 0.4 MG PO CAPS
0.4000 mg | ORAL_CAPSULE | Freq: Every day | ORAL | Status: DC
  Administered 2022-02-20 – 2022-02-23 (×4): 0.4 mg via ORAL
  Filled 2022-02-20 (×4): qty 1

## 2022-02-20 MED ORDER — ASPIRIN EC 81 MG PO TBEC
81.0000 mg | DELAYED_RELEASE_TABLET | Freq: Every day | ORAL | Status: DC
  Administered 2022-02-20 – 2022-02-24 (×5): 81 mg via ORAL
  Filled 2022-02-20 (×5): qty 1

## 2022-02-20 MED ORDER — HYDROXYZINE HCL 25 MG PO TABS
25.0000 mg | ORAL_TABLET | Freq: Three times a day (TID) | ORAL | Status: DC | PRN
  Administered 2022-02-21: 25 mg via ORAL
  Filled 2022-02-20 (×2): qty 1

## 2022-02-20 MED ORDER — ACETAMINOPHEN 500 MG PO TABS
500.0000 mg | ORAL_TABLET | Freq: Three times a day (TID) | ORAL | Status: DC | PRN
  Administered 2022-02-21 – 2022-02-23 (×3): 500 mg via ORAL
  Filled 2022-02-20 (×3): qty 1

## 2022-02-20 NOTE — [place] FL2 (Signed)
?[place] MEDICAID FL2 LEVEL OF CARE SCREENING TOOL  ?  ? ?IDENTIFICATION  ?Patient Name: ?Douglas Peterson. Birthdate: Jul 15, 1951 Sex: male Admission Date (Current Location): ?02/19/2022  ?[place] and [place] Number: ?   ?  Facility and Address:  ?[facility], [address], [place], [place] [address] ?     Provider Number: ?[id number]  ?Attending Physician Name and Address:  ?Sidney Ace, MD ? Relative Name and Phone Number:  ?[name]- daughter [phone] ?   ?Current Level of Care: ?Hospital Recommended Level of Care: ?[place] Prior Approval Number: ?  ? ?Date Approved/Denied: ?  PASRR Number: ?[id number] ? ?Discharge Plan: ?SNF ?  ? ?Current Diagnoses: ?Patient Active Problem List  ? Diagnosis Date Noted  ? Hypotension 02/19/2022  ? Dysphonia 08/13/2021  ? 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 Paresthesia of right upper extremity 08/13/2021  ? SIRS (systemic inflammatory response syndrome) ([place]) 08/05/2020  ? GERD (gastroesophageal reflux disease) 06/05/2020  ? Alcohol use disorder, severe, dependence ([place]) 05/28/2020  ? Paroxysmal A-fib ([place]) 03/17/2020  ? Severe protein-calorie malnutrition ([place]) 02/25/2020  ? Primary insomnia 02/06/2020  ? Left inguinal hernia 01/31/2020  ? Encounter for competency evaluation   ? Pancytopenia ([place])   ? Alcoholic cirrhosis of liver without ascites ([place])   ? Thrombocytopenia ([place])   ? 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 Alcohol withdrawal delirium, acute, hyperactive ([place]) 09/21/2019  ? Pressure injury of ankle, stage 1 08/15/2019  ? Palliative care by specialist   ? Alcohol withdrawal ([place]) 03/20/2019  ? Closed fracture of right proximal humerus 03/19/2019  ? Vitamin D deficiency 01/11/2019  ? Osteoporosis 12/22/2018  ? Closed nondisplaced fracture of acromial process of right scapula with routine healing 11/15/2018  ? Compression fracture of L2 vertebra with routine healing  11/15/2018  ? Delirium tremens ([place]) 03/08/2018  ? HTN (hypertension) 09/16/2017  ? 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 Steatohepatitis 12/03/2016  ? Abnormal liver enzymes 06/17/2016  ? Hereditary hemochromatosis ([place]) 06/17/2016  ? Anxiety 07/16/2015  ? Benign essential hypertension 05/22/2014  ? H/O traumatic brain injury 05/22/2014  ? 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[place]) 05/22/2014  ? ? ?Orientation RESPIRATION BLADDER Height & Weight   ?  ?Self, Time, Situation, Place ? Normal Incontinent Weight: 64.6 kg ?Height:  5' 3"  (160 cm)  ?BEHAVIORAL SYMPTOMS/MOOD NEUROLOGICAL BOWEL NUTRITION STATUS  ?    Incontinent Diet (Regular)  ?AMBULATORY STATUS COMMUNICATION OF NEEDS Skin   ?Extensive Assist Verbally Skin abrasions (laceration to the back of head) ?  ?  ?  ?    ?     ?     ? ? ?Personal Care Assistance Level of Assistance  ?Bathing, Feeding, Dressing Bathing Assistance: Limited assistance ?Feeding assistance: Limited assistance ?Dressing Assistance: Limited assistance ?   ? ?Functional Limitations Info  ?Sight, Hearing, Speech Sight Info: Adequate ?Hearing Info: Adequate ?Speech Info: Adequate  ? ? ?SPECIAL CARE FACTORS FREQUENCY  ?PT (By licensed PT), OT (By licensed OT), Speech therapy   ?  ?PT Frequency: 5 times per week ?OT Frequency: 5 times per week ?  ?  ?Speech Therapy Frequency: eval at facility ?   ? ? ?Contractures Contractures Info: Not present  ? ? ?Additional Factors Info  ?Code Status, Allergies Code Status Info: Full ?Allergies Info: hydrochlorothiazide ?  ?  ?  ?   ? ?Current Medications (02/20/2022):  This is the current hospital active medication list ?Current Facility-Administered Medications  ?  Medication Dose Route Frequency Provider Last Rate Last Admin  ? acetaminophen (TYLENOL) tablet 500 mg  500 mg Oral Q8H PRN Sreenath, Sudheer B, MD      ? ascorbic acid (VITAMIN C) tablet 1,000 mg  1,000 mg Oral Daily Priscella Mann, Sudheer B, MD   1,000 mg at 02/20/22 0859  ? aspirin EC tablet 81  mg  81 mg Oral Daily Ralene Muskrat B, MD   81 mg at 02/20/22 0859  ? atenolol (TENORMIN) tablet 25 mg  25 mg Oral BID Priscella Mann, Sudheer B, MD      ? atorvastatin (LIPITOR) tablet 10 mg  10 mg Oral Daily Ralene Muskrat B, MD   10 mg at 02/20/22 0859  ? cholecalciferol (VITAMIN D3) tablet 5,000 Units  5,000 Units Oral Daily Ralene Muskrat B, MD   5,000 Units at 02/20/22 [phone]  ? feeding supplement (ENSURE ENLIVE / ENSURE PLUS) liquid 237 mL  237 mL Oral BID BM Sreenath, Sudheer B, MD      ? folic acid (FOLVITE) tablet 1 mg  1 mg Oral Daily Sreenath, Sudheer B, MD   1 mg at 02/20/22 5916  ? hydrALAZINE (APRESOLINE) injection 10 mg  10 mg Intravenous Q4H PRN Ralene Muskrat B, MD      ? hydrOXYzine (ATARAX) tablet 25 mg  25 mg Oral TID PRN Ralene Muskrat B, MD      ? multivitamin with minerals tablet 1 tablet  1 tablet Oral Daily Ralene Muskrat B, MD   1 tablet at 02/20/22 0858  ? ondansetron (ZOFRAN) tablet 4 mg  4 mg Oral Q8H PRN Sreenath, Sudheer B, MD      ? pantoprazole (PROTONIX) EC tablet 40 mg  40 mg Oral QHS Sreenath, Sudheer B, MD      ? PARoxetine (PAXIL) tablet 30 mg  30 mg Oral Daily Sreenath, Sudheer B, MD      ? senna-docusate (Senokot-S) tablet 2 tablet  2 tablet Oral QHS Sreenath, Sudheer B, MD      ? tamsulosin (FLOMAX) capsule 0.4 mg  0.4 mg Oral q1800 Sreenath, Sudheer B, MD      ? thiamine tablet 100 mg  100 mg Oral Daily Ralene Muskrat B, MD   100 mg at 02/20/22 0858  ? ?Current Outpatient Medications  ?Medication Sig Dispense Refill  ? Ascorbic Acid (VITAMIN C) 1000 MG tablet Take 1 tablet (1,000 mg total) by mouth daily. 30 tablet 0  ? aspirin EC 81 MG tablet Take 1 tablet (81 mg total) by mouth daily. Swallow whole. 30 tablet 0  ? atorvastatin (LIPITOR) 10 MG tablet Take 1 tablet (10 mg total) by mouth daily. 30 tablet 0  ? baclofen (LIORESAL) 10 MG tablet Take 1 tablet (10 mg total) by mouth 3 (three) times daily. 30 each 0  ? Cholecalciferol 125 MCG (5000 UT) TABS Take 1  tablet (5,000 Units total) by mouth daily. 30 tablet 0  ? folic acid (FOLVITE) 1 MG tablet Take 1 tablet (1 mg total) by mouth daily. 30 tablet 0  ? Multiple Vitamin (MULTIVITAMIN WITH MINERALS) TABS tablet Take 1 tablet by mouth daily. 30 tablet 0  ? omeprazole (PRILOSEC) 10 MG capsule Take 10 mg by mouth daily.    ? PARoxetine (PAXIL) 30 MG tablet Take 1 tablet (30 mg total) by mouth daily. 30 tablet 0  ? polyethylene glycol powder (GLYCOLAX/MIRALAX) 17 GM/SCOOP powder Take 17 g by mouth daily. 255 g 0  ? senna-docusate (SENOKOT-S) 8.6-50 MG tablet Take 2 tablets  by mouth at bedtime. 30 tablet 1  ? tamsulosin (FLOMAX) 0.4 MG CAPS capsule Take 0.4 mg by mouth daily.    ? thiamine 100 MG tablet Take 1 tablet (100 mg total) by mouth daily. 30 tablet 0  ? tiZANidine (ZANAFLEX) 4 MG tablet Take 8 mg by mouth 2 (two) times daily.    ? acetaminophen (TYLENOL) 500 MG tablet Take 1 tablet (500 mg total) by mouth every 8 (eight) hours as needed for mild pain or fever. 15 tablet 0  ? aspirin 81 MG chewable tablet Chew 81 mg by mouth daily. (Patient not taking: Reported on 02/19/2022)    ? atenolol (TENORMIN) 25 MG tablet Take 1 tablet (25 mg total) by mouth 2 (two) times daily. 30 tablet 0  ? Calcium Carb-Cholecalciferol (OYSTER SHELL CALCIUM W/D) 500-200 MG-UNIT TABS Take 1 tablet by mouth daily as needed. 30 tablet 0  ? doxepin (SINEQUAN) 10 MG capsule Take 1 capsule (10 mg total) by mouth at bedtime as needed (sleep). 30 capsule 0  ? feeding supplement (ENSURE ENLIVE / ENSURE PLUS) LIQD Take 237 mLs by mouth 2 (two) times daily between meals. 237 mL 12  ? hydrOXYzine (ATARAX/VISTARIL) 25 MG tablet Take 1 tablet (25 mg total) by mouth 3 (three) times daily as needed for anxiety or itching. 30 tablet 0  ? ondansetron (ZOFRAN) 4 MG tablet Take 4 mg by mouth every 8 (eight) hours as needed.    ? senna (SENOKOT) 8.6 MG TABS tablet Take 2 tablets by mouth 2 (two) times daily. (Patient not taking: Reported on 02/19/2022)    ? Sod  Picosulfate-Mag Ox-Cit Acd (CLENPIQ) 10-3.5-12 MG-GM -GM/160ML SOLN Take 320 mLs by mouth as directed. 320 mL 0  ? tiZANidine (ZANAFLEX) 2 MG tablet Take 4 mg by mouth 2 (two) times daily. (Patient not tak

## 2022-02-20 NOTE — Progress Notes (Signed)
?PROGRESS NOTE ? ? ? ?Douglas Peterson.  IFO:[id number] DOB: 02-12-1951 DOA: 02/19/2022 ?PCP: Eda Paschal, MD  ? ? ?Brief Narrative:  ?[name] a 71 year old male who was recently boarded in the emergency room for 2 months pending placement and change in skilled nursing facility.  [facility].  Presented back to the ED on 4/19 with suppose it altered mentation and significant hypotension associated with a four-point drop in his hemoglobin.  Patient does have a history of epistaxis which may be underlying some of the drop in hemoglobin.  No bleeding noted since patient has been hospitalized.  There was initial concern that his presentation at [facility] was consistent with acute alcohol withdrawal.  This is highly unlikely, nearly impossible as the patient has been at [facility] emergency room for 2 months. ?  ?At time of my evaluation patient is resting in bed.  He is chronically ill and frail appearing but in no visible distress.  He is alert and oriented x3.  Negative procalcitonin, normal anemia, downtrending LFTs, afebrile.  Hemoglobin stable.  [name] appropriate for transfer back to skilled nursing facility.  We will reinitiate insurance authorization.  PT, OT, SLP evaluations requested.  TOC reengaged ? ? ? ? ?Assessment & Plan: ?  ?Principal Problem: ?  Hypotension ? ?Altered mental status ?Transient.  Suspect secondary to hypotension and intravascular volume depletion.  Ammonia recovered to normal.  No overt evidence of hepatic encephalopathy.  Will not start lactulose.  Patient alert and oriented x3 on my evaluation ? ?Hypotension ?Decreased H&H ?Hemoglobin drop likely secondary to epistaxis.  Stable on my evaluation.  No drop in hemoglobin.  No acute epistaxis noted.  Stool guaiac negative.  Blood pressure is normalized.  Daily CBC ? ?Abnormal chest CT with patchy infiltrates and groundglass opacities ?Procalcitonin negative.  Chest CT findings likely  represent atelectasis versus scarring/groundglass opacities ? ?History of alcohol use ?[facility] room with a presumptive diagnosis of alcohol withdrawal.  This is highly unlikely, nearly impossible as the patient has been boarding in [facility] ED for 2 months and has not had anything to drink since being in [place] or since leaving to the SNF ? ?Abnormal LFTs ?Stable, downtrending ? ?Paroxysmal atrial fibrillation ?Patient not on anticoagulation.  Unclear reason.  Probably because he is a fall risk.  We will restart home beta-blocker ? ?Status post CVA with right hemiparesis ?Restart aspirin.  Speech, therapy evaluations.  Stable for discharge back to skilled nursing facility. ? ? ? ?DVT prophylaxis: SCD ?Code Status: Full ?Family Communication: None ?Disposition Plan: Status is: Observation ?The patient remains OBS appropriate and will d/c before 2 midnights. ? ? ?  ?Level of care: Med-Surg ? ?Consultants:  ?None ? ?Procedures:  ?None ? ?Antimicrobials: ?None  ? ? ?Subjective: ?Seen and examined.  Frail appearing, but stable.  NAD.  No complaints ? ?Objective: ?Vitals:  ? 02/20/22 0800 02/20/22 0900 02/20/22 1100 02/20/22 1300  ?BP: (!) 178/89 (!) 171/99 (!) 171/105 (!) 141/75  ?Pulse: (!) 57 63 70 73  ?Resp: 19 (!) 21 (!) 23 14  ?Temp:      ?TempSrc:      ?SpO2: 98% 98% 97% 98%  ?Weight:      ?Height:      ? ? ?Intake/Output Summary (Last 24 hours) at 02/20/2022 1340 ?Last data filed at 02/20/2022 0859 ?Gross per 24 hour  ?Intake 1740.95 ml  ?Output --  ?Net  1740.95 ml  ? ?Filed Weights  ? 02/19/22 1018  ?Weight: 64.6 kg  ? ? ?Examination: ? ?General exam: NAD.  Frail appearing ?Respiratory system: Clear to auscultation. Respiratory effort normal. ?Cardiovascular system: S1-S2, RRR, no murmurs, no pedal edema ?Gastrointestinal system: Thin, soft, NT/ND, normal bowel sounds ?Central nervous system: Alert and oriented. No focal neurological deficits. ?Extremities:  Surgical decreased power bilaterally ?Skin: Pale and thin with no obvious rashes or lesions ?Psychiatry: Judgement and insight appear normal. Mood & affect appropriate.  ? ? ? ?Data Reviewed: I have personally reviewed following labs and imaging studies ? ?CBC: ?Recent Labs  ?Lab 02/14/22 ?0152 02/19/22 ?1139 02/20/22 ?7741  ?WBC 10.2 5.6 4.9  ?NEUTROABS 3.4 2.6  --   ?HGB 16.1 12.4* 13.4  ?HCT 46.8 37.4* 40.0  ?MCV 90.2 95.2 95.7  ?PLT 184 111* 98*  ? ?Basic Metabolic Panel: ?Recent Labs  ?Lab 02/14/22 ?0152 02/19/22 ?1139 02/20/22 ?2878  ?NA 130* 136 135  ?K 3.6 3.8 3.6  ?CL 88* 105 105  ?CO2 30 23 26   ?GLUCOSE 117* 106* 74  ?BUN <5* 18 10  ?CREATININE 0.63 0.76 0.61  ?CALCIUM 9.1 8.2* 8.1*  ?MG 2.2  --   --   ? ?GFR: ?Estimated Creatinine Clearance: 69.1 mL/min (by C-G formula based on SCr of 0.61 mg/dL). ?Liver Function Tests: ?Recent Labs  ?Lab 02/14/22 ?0152 02/19/22 ?1139 02/20/22 ?6767  ?AST 200* 89* 80*  ?ALT 163* 102* 104*  ?ALKPHOS 104 70 70  ?BILITOT 1.4* 0.6 0.7  ?PROT 8.7* 5.5* 6.0*  ?ALBUMIN 5.0 3.0* 3.3*  ? ?No results for input(s): LIPASE, AMYLASE in the last 168 hours. ?Recent Labs  ?Lab 02/14/22 ?0320 02/19/22 ?1034 02/20/22 ?2094  ?AMMONIA 18 50* 28  ? ?Coagulation Profile: ?Recent Labs  ?Lab 02/19/22 ?1139  ?INR 1.1  ? ?Cardiac Enzymes: ?No results for input(s): CKTOTAL, CKMB, CKMBINDEX, TROPONINI in the last 168 hours. ?BNP (last 3 results) ?No results for input(s): PROBNP in the last 8760 hours. ?HbA1C: ?No results for input(s): HGBA1C in the last 72 hours. ?CBG: ?No results for input(s): GLUCAP in the last 168 hours. ?Lipid Profile: ?No results for input(s): CHOL, HDL, LDLCALC, TRIG, CHOLHDL, LDLDIRECT in the last 72 hours. ?Thyroid Function Tests: ?Recent Labs  ?  02/19/22 ?1139  ?TSH 3.283  ? ?Anemia Panel: ?Recent Labs  ?  02/19/22 ?1139  ?TIBC 206*  ?IRON 81  ? ?Sepsis Labs: ?Recent Labs  ?Lab 02/19/22 ?1032 02/19/22 ?1034 02/19/22 ?1125  ?PROCALCITON  --  <0.10  --   ?LATICACIDVEN 1.5   --  1.4  ? ? ?Recent Results (from the past 240 hour(s))  ?Urine Culture     Status: Abnormal  ? Collection Time: 02/15/22  4:09 AM  ? Specimen: Urine, Clean Catch  ?Result Value Ref Range Status  ? Specimen Description   Final  ?  URINE, CLEAN CATCH ?Performed at [facility], [address] [place], [place] [address] ?  ? Special Requests   Final  ?  NONE ?Performed at [facility], [address] [place], [place] [address] ?  ? Culture (A)  Final  ?  >=100,000 COLONIES/mL MULTIPLE SPECIES PRESENT, SUGGEST RECOLLECTION  ? Report Status 02/16/2022 FINAL  Final  ?Blood Culture (routine x 2)     Status: None (Preliminary result)  ? Collection Time: 02/19/22 10:30 AM  ? Specimen: BLOOD  ?Result Value Ref Range Status  ? Specimen Description BLOOD  Final  ? Special Requests NONE  Final  ?  Culture   Final  ?  NO GROWTH < 24 HOURS ?Performed at [facility], [address], [place], [place] [address] ?  ? Report Status PENDING  Incomplete  ?Blood Culture (routine x 2)     Status: None (Preliminary result)  ? Collection Time: 02/19/22 10:33 AM  ? Specimen: Leg; Blood  ?Result Value Ref Range Status  ? Specimen Description LEG BLOOD RIGHT LEG  Final  ? Special Requests   Final  ?  BOTTLES DRAWN AEROBIC AND ANAEROBIC Blood Culture adequate volume  ? Culture   Final  ?  NO GROWTH < 24 HOURS ?Performed at [facility], [address], [place], [place] [address] ?  ? Report Status PENDING  Incomplete  ?Resp Panel by RT-PCR (Flu A&B, Covid) Nasopharyngeal Swab     Status: None  ? Collection Time: 02/19/22 10:34 AM  ? Specimen: Nasopharyngeal Swab; Nasopharyngeal(NP) swabs in vial transport medium  ?Result Value Ref Range Status  ? SARS Coronavirus 2 by RT PCR NEGATIVE NEGATIVE Final  ?  Comment: (NOTE) ?SARS-CoV-2 target nucleic acids are NOT DETECTED. ? ?The SARS-CoV-2 RNA is generally detectable in upper respiratory ?specimens during the acute phase of infection. The  lowest ?concentration of SARS-CoV-2 viral copies this assay can detect is ?138 copies/mL. A negative result does not preclude SARS-Cov-2 ?infection and should not be used as the sole basis for treatment or ?other patient manage

## 2022-02-20 NOTE — Progress Notes (Addendum)
No charge note.  ? ?[name] a 71 year old male who was recently boarded in the emergency room for 2 months pending placement and change in skilled nursing facility.  [facility] on [facility].  Presented back to the ED on 4/19 with supposed altered mentation and significant hypotension associated with a four-point drop in his hemoglobin.  Patient does have a history of epistaxis which may be underlying some of the drop in hemoglobin.  No bleeding noted since patient has been hospitalized.  There was initial concern that his presentation at [facility] was consistent with acute alcohol withdrawal.  This is highly unlikely, nearly impossible as the patient has been at [facility] emergency room for 2 months. ? ?At time of my evaluation patient is resting in bed.  He is chronically ill and frail appearing but in no visible distress.  He is alert and oriented x3.  Negative procalcitonin, normal ammonia, downtrending LFTs, afebrile.  Hemoglobin stable.  [name] appropriate for transfer back to skilled nursing facility.  We will reinitiate insurance authorization.  PT, OT, SLP evaluations requested.  TOC reengaged ? ?Ralene Muskrat MD ? ? ?

## 2022-02-20 NOTE — Care Management CC44 (Signed)
Condition Code 44 Documentation Completed ? ?Patient Details  ?Name: Douglas Peterson. ?MRN: [id number] ?Date of Birth: 09/23/1951 ? ? ?Condition Code 44 given:  Yes ?Patient signature on Condition Code 44 notice:  Yes ?Documentation of 2 MD's agreement:  Yes ?Code 44 added to claim:  Yes ? ? ? ?Shelbie Hutching, RN ?02/20/2022, 2:15 PM ? ?
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
## 2022-02-20 NOTE — Care Management Obs Status (Signed)
MEDICARE OBSERVATION STATUS NOTIFICATION ? ? ?Patient Details  ?Name: Douglas Peterson. ?MRN: [id number] ?Date of Birth: 1950/11/10 ? ? ?Medicare Observation Status Notification Given:  Yes ? ? ? ?Shelbie Hutching, RN ?02/20/2022, 2:15 PM ?

## 2022-02-20 NOTE — Evaluation (Signed)
Physical Therapy Evaluation ?Patient Details ?Name: Douglas Peterson. ?MRN: [id number] ?DOB: 05/21/1951 ?Today's Date: 02/20/2022 ? ?History of Present Illness ? [name] a 71 y.o. male with HTN, GERD, depression, PAF not on anticoagulation due to multiple falls, right hemiplegia s/p CVA, alcohol use has been boarding in the ED for 2 months awaiting disposition was discharged yesterday to SNF but was sent back  due to concerns for altered mental status and decreased blood pressure. ?  ?Clinical Impression ? Patient seen for PT evaluation. He is well known to therapy services and was cooperative and motivated during evaluation. The patient requires assistance with bed mobility and for sit to stand transfers. He was able to take several steps in the room with assistance with LUE supported on the bed rail. Activity tolerance limited by fatigue and patient with mild tremor noted with prolonged standing. He would be unsafe to return home given current level of functional independence and high fall risk. Recommend SNF placement for ongoing PT efforts to maximize independence and decrease caregiver burden.  ?   ? ?Recommendations for follow up therapy are one component of a multi-disciplinary discharge planning process, led by the attending physician.  Recommendations may be updated based on patient status, additional functional criteria and insurance authorization. ? ?Follow Up Recommendations Skilled nursing-short term rehab (<3 hours/day) ? ?  ?Assistance Recommended at Discharge Frequent or constant Supervision/Assistance  ?Patient can return home with the following ? A lot of help with walking and/or transfers;A lot of help with bathing/dressing/bathroom;Assist for transportation;Help with stairs or ramp for entrance;Direct supervision/assist for medications management ? ?  ?Equipment Recommendations None recommended by PT  ?Recommendations for Other Services ?    ?  ?Functional Status Assessment Patient has had a  recent decline in their functional status and demonstrates the ability to make significant improvements in function in a reasonable and predictable amount of time.  ? ?  ?Precautions / Restrictions Precautions ?Precautions: Fall ?Restrictions ?Weight Bearing Restrictions: No  ? ?  ? ?Mobility ? Bed Mobility ?Overal bed mobility: Needs Assistance ?Bed Mobility: Supine to Sit, Sit to Supine ?  ?  ?Supine to sit: Min assist ?Sit to supine: Mod assist ?  ?General bed mobility comments: assistance for trunk support to sit upright. assistance for BLE to return to bed. cues for task initiation. heavy use of bed rail support with LUE and head of bed elevated ?  ? ?Transfers ?Overall transfer level: Needs assistance ?  ?Transfers: Sit to/from Stand ?Sit to Stand: Mod assist ?  ?  ?  ?  ?  ?General transfer comment: lifting and lowering assistance provided with transfer from bed in lowest height. faciliation and verbal cues for forward weight shifting for lift off ?  ? ?Ambulation/Gait ?Ambulation/Gait assistance: Min assist ?Gait Distance (Feet): 2 Feet ?  ?  ?  ?  ?  ?General Gait Details: patient able to take 2 small steps at the bed using bed rail for LUE support with steadying assistance provided. patient appears to have mild tremor in standing. patient also with decreased awareness of his physical limitations ? ?Stairs ?  ?  ?  ?  ?  ? ?Wheelchair Mobility ?  ? ?Modified Rankin (Stroke Patients Only) ?  ? ?  ? ?Balance Overall balance assessment: History of Falls ?Sitting-balance support: No upper extremity supported ?Sitting balance-Leahy Scale: Fair ?Sitting balance - Comments: no loss of balance with LUE support ?  ?Standing balance support: Single extremity supported ?Standing balance-Leahy Scale:  Fair ?Standing balance comment: Min guard provided for safety ?  ?  ?  ?  ?  ?  ?  ?  ?  ?  ?  ?   ? ? ? ?Pertinent Vitals/Pain Pain Assessment ?Pain Assessment: No/denies pain  ? ? ?Home Living Family/patient expects to  be discharged to:: Skilled nursing facility ?Living Arrangements: Other relatives ?  ?  ?  ?  ?  ?  ?  ?  ?Additional Comments: was discharged from prolonged hospital stay to SNF recently. prior to that was living with other relatives  ?  ?Prior Function Prior Level of Function : Independent/Modified Independent;History of Falls (last six months) ?  ?  ?  ?  ?  ?  ?Mobility Comments: SPC for ambulation with history of falls ?ADLs Comments: Mod I with bathing/dressing ?  ? ? ?Hand Dominance  ? Dominant Hand:  (he is right handed but now uses left hand following stroke) ? ?  ?Extremity/Trunk Assessment  ? Upper Extremity Assessment ?Upper Extremity Assessment: RUE deficits/detail;LUE deficits/detail ?RUE Deficits / Details: residual deficits from prior stroke with spastic hemiplegia, flexion pattern ?LUE Deficits / Details: generalized weakness throughout ?  ? ?Lower Extremity Assessment ?Lower Extremity Assessment: Generalized weakness (with history of spastic hemiplegia on right side (more pronounced in RUE)) ?  ? ?   ?Communication  ?    ?Cognition Arousal/Alertness: Awake/alert ?Behavior During Therapy: [facility] for tasks assessed/performed ?Overall Cognitive Status: Difficult to assess ?  ?  ?  ?  ?  ?  ?  ?  ?  ?  ?  ?  ?Following Commands: Follows one step commands consistently ?  ?  ?  ?General Comments: patient able to follow single step commands with increased time. oriented to person, place, situation, time of day ?  ?  ? ?  ?General Comments General comments (skin integrity, edema, etc.): mild dizziness is reported with upright seated position. vitals monitored throughout session. supine blood pressure 167/82 and sitting blood pressure 171/105 ? ?  ?Exercises Other Exercises ?Other Exercises: of noted, IV in left hand appears to be out at end of session. alerted nurse. bandaide applied to left hand per patient request.  ? ?Assessment/Plan  ?  ?PT Assessment Patient needs continued PT services  ?PT Problem List  Decreased strength;Decreased range of motion;Decreased activity tolerance;Decreased balance;Decreased mobility;Decreased cognition;Decreased safety awareness ? ?   ?  ?PT Treatment Interventions DME instruction;Gait training;Functional mobility training;Therapeutic activities;Therapeutic exercise;Balance training;Neuromuscular re-education   ? ?PT Goals (Current goals can be found in the Care Plan section)  ?Acute Rehab PT Goals ?Patient Stated Goal: to regain independence ?PT Goal Formulation: With patient ?Time For Goal Achievement: 03/06/22 ?Potential to Achieve Goals: Fair ? ?  ?Frequency Min 2X/week ?  ? ? ?Co-evaluation   ?  ?  ?  ?  ? ? ?  ?AM-PAC PT "6 Clicks" Mobility  ?Outcome Measure Help needed turning from your back to your side while in a flat bed without using bedrails?: A Little ?Help needed moving from lying on your back to sitting on the side of a flat bed without using bedrails?: A Lot ?Help needed moving to and from a bed to a chair (including a wheelchair)?: A Lot ?Help needed standing up from a chair using your arms (e.g., wheelchair or bedside chair)?: A Lot ?Help needed to walk in hospital room?: A Lot ?Help needed climbing 3-5 steps with a railing? : Total ?6 Click Score: 12 ? ?  ?  End of Session   ?Activity Tolerance: Patient tolerated treatment well ?Patient left: in bed;with call bell/phone within reach;with bed alarm set ?Nurse Communication: Mobility status (IV left hand) ?PT Visit Diagnosis: Unsteadiness on feet (R26.81);History of falling (Z91.81);Muscle weakness (generalized) (M62.81) ?  ? ?Time: 4136-4383 ?PT Time Calculation (min) (ACUTE ONLY): 25 min ? ? ?Charges:   PT Evaluation ?$PT Eval Low Complexity: 1 Low ?PT Treatments ?$Therapeutic Activity: 8-22 mins ?  ?   ? ? ?Minna Merritts, PT, MPT ? ? ?Percell Locus ?02/20/2022, 11:39 AM ? ?

## 2022-02-20 NOTE — TOC Initial Note (Signed)
Transition of Care (TOC) - Initial/Assessment Note  ? ? ?Patient Details  ?Name: Douglas Peterson. ?MRN: [id number] ?Date of Birth: 15-Jul-1951 ? ?Transition of Care (TOC) CM/SW Contact:    ?Shelbie Hutching, RN ?Phone Number: ?02/20/2022, 1:55 PM ? ?Clinical Narrative:                 ?Patient back to the emergency room with concern of withdrawal at [facility].  Patient was admitted - he is doing much better today.  Per Admissions at [facility] is requiring authorization again.  Tammy at [facility] [employer].   ? ?Expected Discharge Plan: [place] ?Barriers to Discharge: Insurance Authorization ? ? ?Patient Goals and CMS Choice ?Patient states their goals for this hospitalization and ongoing recovery are:: agrees to rehab ?CMS Medicare.gov Compare Post Acute Care list provided to:: Patient ?Choice offered to / list presented to : Patient ? ?Expected Discharge Plan and Services ?Expected Discharge Plan: [place] ?  ?Discharge Planning Services: CM Consult ?Post Acute Care Choice: [place] ?Living arrangements for the past 2 months: [place] ?                ?DME Arranged: N/A ?DME Agency: NA ?  ?  ?  ?HH Arranged: NA ?[place] Agency: NA ?  ?  ?  ? ?Prior Living Arrangements/Services ?Living arrangements for the past 2 months: [place] ?Lives with:: Self ?Patient language and need for interpreter reviewed:: Yes ?Do you feel safe going back to the place where you live?: Yes      ?Need for Family Participation in Patient Care: Yes (Comment) ?Care giver support system in place?: Yes (comment) (sister) ?Current home services: DME (cane) ?Criminal Activity/Legal Involvement Pertinent to Current Situation/Hospitalization: No - Comment as needed ? ?Activities of Daily Living ?Home Assistive Devices/Equipment: Gilford Rile (specify type) ?ADL Screening (condition at time of admission) ?Patient's cognitive ability adequate to safely complete daily activities?:  Yes ?Is the patient deaf or have difficulty hearing?: No ?Does the patient have difficulty seeing, even when wearing glasses/contacts?: No ?Does the patient have difficulty concentrating, remembering, or making decisions?: Yes ?Patient able to express need for assistance with ADLs?: Yes ?Does the patient have difficulty dressing or bathing?: No ?Independently performs ADLs?: Yes (appropriate for developmental age) ?Does the patient have difficulty walking or climbing stairs?: No ?Weakness of Legs: None ?Weakness of Arms/Hands: None ? ?Permission Sought/Granted ?Permission sought to share information with : Case Manager, Customer service manager, Family Supports ?Permission granted to share information with : Yes, Verbal Permission Granted ?   ? Permission granted to share info w AGENCY: [facility] ?   ?   ? ?Emotional Assessment ?Appearance:: Appears stated age ?  ?  ?Orientation: : Oriented to Self, Oriented to Place, Oriented to  Time, Oriented to Situation ?Alcohol / Substance Use: Alcohol Use ?Psych Involvement: No (comment) ? ?Admission diagnosis:  Hepatic encephalopathy ([place]) [K76.82] ?Patient Active Problem List  ? Diagnosis Date Noted  ? Hypotension 02/19/2022  ? Dysphonia 08/13/2021  ? 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 Paresthesia of right upper extremity 08/13/2021  ? SIRS (systemic inflammatory response syndrome) ([place]) 08/05/2020  ? GERD (gastroesophageal reflux disease) 06/05/2020  ? Alcohol use disorder, severe, dependence ([place]) 05/28/2020  ? Paroxysmal A-fib ([place]) 03/17/2020  ? Severe protein-calorie malnutrition ([place]) 02/25/2020  ? Primary insomnia 02/06/2020  ? Left inguinal hernia 01/31/2020  ? Encounter for competency evaluation   ? Pancytopenia ([place])   ?  Alcoholic cirrhosis of liver without ascites ([place])   ? Thrombocytopenia ([place])   ? 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 Alcohol withdrawal delirium, acute, hyperactive ([place]) 09/21/2019  ? Pressure injury  of ankle, stage 1 08/15/2019  ? Palliative care by specialist   ? Alcohol withdrawal ([place]) 03/20/2019  ? Closed fracture of right proximal humerus 03/19/2019  ? Vitamin D deficiency 01/11/2019  ? Osteoporosis 12/22/2018  ? Closed nondisplaced fracture of acromial process of right scapula with routine healing 11/15/2018  ? Compression fracture of L2 vertebra with routine healing 11/15/2018  ? Delirium tremens ([place]) 03/08/2018  ? HTN (hypertension) 09/16/2017  ? 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 Steatohepatitis 12/03/2016  ? Abnormal liver enzymes 06/17/2016  ? Hereditary hemochromatosis ([place]) 06/17/2016  ? Anxiety 07/16/2015  ? Benign essential hypertension 05/22/2014  ? H/O traumatic brain injury 05/22/2014  ? 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[place]) 05/22/2014  ? ?PCP:  Eda Paschal, MD ?Pharmacy:   ?[facility] ?[place] [address] ?Phone: [phone] Fax: [phone] ? ? ? ? ?Social Determinants of Health (SDOH) Interventions ?  ? ?Readmission Risk Interventions ? ?  08/23/2020  ? 11:52 AM 08/19/2020  ?  1:32 PM 08/14/2020  ?  9:43 AM  ?Readmission Risk Prevention Plan  ?Transportation Screening Complete Complete Complete  ?Medication Review Press photographer) Complete Complete   ?PCP or Specialist appointment within 3-5 days of discharge  Complete Complete  ?[place] or Home Care Consult Complete  Complete  ?SW Recovery Care/Counseling Consult  Complete Complete  ?Palliative Care Screening  Not Applicable Not Applicable  ?[place] Not Applicable  Patient Refused  ? ? ? ?

## 2022-02-20 NOTE — Evaluation (Addendum)
Occupational Therapy Evaluation ?Patient Details ?Name: Douglas Peterson. ?MRN: [id number] ?DOB: 1951/03/17 ?Today's Date: 02/20/2022 ? ? ?History of Present Illness [name] a 71 y.o. male with HTN, GERD, depression, PAF not on anticoagulation due to multiple falls, right hemiplegia s/p CVA, alcohol use has been boarding in the ED for 2 months awaiting disposition was discharged yesterday to SNF but was sent back  due to concerns for altered mental status and decreased blood pressure.  ? ?Clinical Impression ?  ?Douglas Peterson presents today with generalized weakness, limited endurance, and R-sided hemiplegia. He requires Min-Mod A with bed mobility and transfers, with fair sitting and standing balance, and able to ambulate only very limited distances while reliant on LUE support. He is able to perform dressing tasks with Min-Mod A. BP in supine 143/77. Recommend DC to SNF to assist pt with regaining strength, endurance, and improved balance; to reduce risk of falls; and to increase independence and return to PLOF.  ? ?Recommendations for follow up therapy are one component of a multi-disciplinary discharge planning process, led by the attending physician.  Recommendations may be updated based on patient status, additional functional criteria and insurance authorization.  ? ?Follow Up Recommendations ? Skilled nursing-short term rehab (<3 hours/day)  ?  ?Assistance Recommended at Discharge Frequent or constant Supervision/Assistance  ?Patient can return home with the following Help with stairs or ramp for entrance;A lot of help with bathing/dressing/bathroom;A lot of help with walking and/or transfers;Assist for transportation;Direct supervision/assist for medications management;Assistance with cooking/housework ? ?  ?Functional Status Assessment ? Patient has had a recent decline in their functional status and demonstrates the ability to make significant improvements in function in a reasonable and predictable amount of  time.  ?Equipment Recommendations ? None recommended by OT  ?  ?Recommendations for Other Services   ? ? ?  ?Precautions / Restrictions Precautions ?Precautions: Fall ?Restrictions ?Weight Bearing Restrictions: No  ? ?  ? ?Mobility Bed Mobility ?  ?Bed Mobility: Supine to Sit, Sit to Supine ?  ?  ?Supine to sit: Min assist ?Sit to supine: Min assist ?  ?General bed mobility comments: reliant on bed rails for support ?  ? ?Transfers ?Overall transfer level: Needs assistance ?Equipment used: 1 person hand held assist ?Transfers: Sit to/from Stand ?Sit to Stand: Min assist ?  ?  ?  ?  ?  ?  ?  ? ?  ?Balance Overall balance assessment: History of Falls, Needs assistance ?Sitting-balance support: No upper extremity supported ?Sitting balance-Leahy Scale: Fair ?  ?Postural control: Posterior lean ?Standing balance support: Single extremity supported ?Standing balance-Leahy Scale: Fair ?Standing balance comment: Requires LUE support to maintain standing balance ?  ?  ?  ?  ?  ?  ?  ?  ?  ?  ?  ?   ? ?ADL either performed or assessed with clinical judgement  ? ?ADL Overall ADL's : Needs assistance/impaired ?  ?  ?  ?  ?  ?  ?  ?  ?Upper Body Dressing : Minimal assistance;Sitting ?Upper Body Dressing Details (indicate cue type and reason): Min A for donning R sleeve ?Lower Body Dressing: Moderate assistance ?  ?  ?  ?  ?  ?  ?  ?  ?   ? ? ? ?Vision Patient Visual Report: No change from baseline ?   ?   ?Perception   ?  ?Praxis   ?  ? ?Pertinent Vitals/Pain Pain Assessment ?Pain Assessment: 0-10 ?Pain Score: 4  ?  Pain Location: anal fissure ?Pain Descriptors / Indicators: Aching ?Pain Intervention(s): Repositioned  ? ? ? ?Hand Dominance  (he is right handed but now uses left hand following stroke) ?  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  ?Lower Extremity Assessment ?Lower Extremity Assessment: Generalized weakness ?  ?  ?  ?Communication Communication ?Communication: No difficulties ?  ?Cognition Arousal/Alertness: Awake/alert ?Behavior During Therapy: [facility] for tasks assessed/performed ?Overall Cognitive Status: Within Functional Limits for tasks assessed ?  ?  ?  ?  ?  ?  ?  ?  ?  ?  ?  ?  ?Following Commands: Follows one step commands consistently ?  ?  ?  ?General Comments: Able to follow directions with increased time. Oriented to self, time, place, situation ?  ?  ?General Comments  BP in supine 143/77, no dizziness reported ? ?  ?Exercises Other Exercises ?Other Exercises: Educ re: plan of care, DC recs, falls prevention ?  ?Shoulder Instructions    ? ? ?Home Living Family/patient expects to be discharged to:: Skilled nursing facility ?Living Arrangements: Other relatives ?Available Help at Discharge: Family;Available PRN/intermittently ?Type of Home: House ?Home Access: Stairs to enter ?Entrance Stairs-Number of Steps: 1 ?Entrance Stairs-Rails: Right;Left ?Home Layout: One level ?  ?  ?Bathroom Shower/Tub: Tub/shower unit ?  ?Bathroom Toilet: Standard ?  ?  ?Home Equipment: Kasandra Knudsen - single point;Cane - quad;Shower seat ?  ?Additional Comments: was discharged from prolonged hospital stay to SNF recently. prior to that was living with other relatives ?  ? ?  ?Prior Functioning/Environment Prior Level of Function : Independent/Modified Independent;History of Falls (last six months) ?  ?  ?  ?  ?  ?  ?Mobility Comments: SPC for ambulation with history of falls ?ADLs Comments: Mod I with bathing/dressing ?  ? ?  ?  ?OT Problem List: Decreased strength;Decreased range of motion;Decreased activity tolerance;Impaired balance (sitting and/or standing);Decreased safety awareness;Impaired UE functional use;Impaired tone ?  ?   ?OT Treatment/Interventions: Self-care/ADL training;Manual  therapy;Therapeutic exercise;Patient/family education;Modalities;Balance training;Therapeutic activities;DME and/or AE instruction  ?  ?OT Goals(Current goals can be found in the care plan section) Acute Rehab OT Goals ?Patient Stated Goal: to get back to living alone at his home ?OT Goal Formulation: With patient ?Time For Goal Achievement: 03/06/22 ?Potential to Achieve Goals: Good  ?OT Frequency: Min 2X/week ?  ? ?Co-evaluation   ?  ?  ?  ?  ? ?  ?AM-PAC OT "6 Clicks" Daily Activity     ?Outcome Measure Help from another person eating meals?: A Little ?Help from another person taking care of personal grooming?: A Little ?Help from another person toileting, which includes using toliet, bedpan, or urinal?: A Lot ?Help from another person bathing (including washing, rinsing, drying)?: A Lot ?Help from another person to put on and taking off regular upper body clothing?: A Little ?Help from another person to put on and taking off regular lower body clothing?: A Lot ?6 Click Score: 15 ?  ?End of Session Nurse Communication: Mobility status ? ?Activity Tolerance: Patient tolerated treatment well ?Patient left: in bed;with call bell/phone within reach;with bed alarm set ? ?OT Visit Diagnosis: History of falling (Z91.81);Unsteadiness on feet (R26.81);Muscle weakness (generalized) (M62.81)  ?              ?Time: 8250-0370 ?OT  Time Calculation (min): 12 min ?Charges:  OT General Charges ?$OT Visit: 1 Visit ?OT Evaluation ?$OT Eval Low Complexity: 1 Low ?OT Treatments ?$Self Care/Home Management : 8-22 mins ?Josiah Lobo, PhD, MS, OTR/L ?02/20/22, 1:45 PM ? ?

## 2022-02-20 NOTE — Progress Notes (Signed)
Admission profile updated. ?

## 2022-02-21 DIAGNOSIS — I959 Hypotension, unspecified: Secondary | ICD-10-CM | POA: Diagnosis not present

## 2022-02-21 LAB — HIV ANTIBODY (ROUTINE TESTING W REFLEX): HIV Screen 4th Generation wRfx: NONREACTIVE

## 2022-02-21 NOTE — Progress Notes (Signed)
Physical Therapy Treatment ?Patient Details ?Name: Douglas Peterson. ?MRN: [id number] ?DOB: Feb 25, 1951 ?Today's Date: 02/21/2022 ? ? ?History of Present Illness [name] a 71 y.o. male with HTN, GERD, depression, PAF not on anticoagulation due to multiple falls, right hemiplegia s/p CVA, alcohol use has been boarding in the ED for 2 months awaiting disposition was discharged yesterday to SNF but was sent back  due to concerns for altered mental status and decreased blood pressure. ? ?  ?PT Comments  ? ? Pt was sitting in recliner upon arriving. He is well known by our department from past admissions. Pt is alert and oriented. Does have slight resting tremors that he reports are getting better. He stood and ambulated to RN station with [facility] + assistance. Pt is very unsteady and will require extensive PT to return to PLOF. Author recommends DC to SNF to address deficits while maximizing safety with ADLs.  ?  ?Recommendations for follow up therapy are one component of a multi-disciplinary discharge planning process, led by the attending physician.  Recommendations may be updated based on patient status, additional functional criteria and insurance authorization. ? ?Follow Up Recommendations ? Skilled nursing-short term rehab (<3 hours/day) ?  ?  ?Assistance Recommended at Discharge Frequent or constant Supervision/Assistance  ?Patient can return home with the following A lot of help with walking and/or transfers;A lot of help with bathing/dressing/bathroom;Assist for transportation;Help with stairs or ramp for entrance;Direct supervision/assist for medications management ?  ?Equipment Recommendations ? None recommended by PT  ?  ?   ?Precautions / Restrictions Precautions ?Precautions: Fall ?Restrictions ?Weight Bearing Restrictions: No  ?  ? ?Mobility ? Bed Mobility ?   ?General bed mobility comments: pt was in recliner pre/post session ?  ? ?Transfers ?Overall transfer level: Needs assistance ?Equipment used: Straight  cane ?Transfers: Sit to/from Stand ?Sit to Stand: Min assist ?  ?  ?  ?  ?  ?General transfer comment: min  assist + vcs for technique improvements ?  ? ?Ambulation/Gait ?Ambulation/Gait assistance: Min assist ?Gait Distance (Feet): 60 Feet ?Assistive device: Straight cane ?Gait Pattern/deviations: Step-to pattern, Staggering left, Staggering right, Drifts right/left, Narrow base of support ?Gait velocity: decreased ?  ?  ?General Gait Details: pt has severe unsteadiness with ambulation with SPC. min assist top prevent falling. distance limited by fatigue ? ?  ?Balance Overall balance assessment: History of Falls, Needs assistance ?Sitting-balance support: No upper extremity supported ?Sitting balance-Leahy Scale: Fair ?Sitting balance - Comments: no loss of balance with LUE support ?Postural control: Posterior lean ?  ?Standing balance-Leahy Scale: Fair ?Standing balance comment: Requires LUE support to maintain standing balance ?  ?   ?Cognition Arousal/Alertness: Awake/alert ?Behavior During Therapy: [facility] for tasks assessed/performed ?Overall Cognitive Status: Within Functional Limits for tasks assessed ?Area of Impairment: Attention, Following commands, Safety/judgement, Awareness, Problem solving ?  ?  ?   ?   ?   ? ?Pertinent Vitals/Pain Pain Assessment ?Pain Assessment: No/denies pain ?Pain Score: 0-No pain  ? ? ? ?PT Goals (current goals can now be found in the care plan section) Acute Rehab PT Goals ?Patient Stated Goal: to regain independence ?Progress towards PT goals: Progressing toward goals ? ?  ?Frequency ? ? ? Min 2X/week ? ? ? ?  ?PT Plan Current plan remains appropriate  ? ? ?Co-evaluation   ?  ?PT goals addressed during session: Mobility/safety with mobility;Balance;Strengthening/ROM;Proper use of DME ?  ?  ? ?  ?AM-PAC PT "6 Clicks" Mobility   ?Outcome Measure ?  Help needed turning from your back to your side while in a flat bed without using bedrails?: A Little ?Help needed moving from lying on  your back to sitting on the side of a flat bed without using bedrails?: A Little ?Help needed moving to and from a bed to a chair (including a wheelchair)?: A Little ?Help needed standing up from a chair using your arms (e.g., wheelchair or bedside chair)?: A Lot ?Help needed to walk in hospital room?: A Lot ?Help needed climbing 3-5 steps with a railing? : A Lot ?6 Click Score: 15 ? ?  ?End of Session   ?Activity Tolerance: Patient tolerated treatment well;Patient limited by fatigue ?Patient left: in chair;with call bell/phone within reach;with chair alarm set ?Nurse Communication: Mobility status ?PT Visit Diagnosis: Unsteadiness on feet (R26.81);History of falling (Z91.81);Muscle weakness (generalized) (M62.81) ?Pain - Right/Left: Right ?  ? ? ?Time: 7356-7014 ?PT Time Calculation (min) (ACUTE ONLY): 9 min ? ?Charges:  $Gait Training: 8-22 mins          ?          ?Julaine Fusi PTA ?02/21/22, 11:25 AM  ? ?

## 2022-02-21 NOTE — Progress Notes (Signed)
?PROGRESS NOTE ? ? ? ?Douglas Peterson.  BPZ:[id number] DOB: 08/22/1951 DOA: 02/19/2022 ?PCP: Eda Paschal, MD  ? ? ?Brief Narrative:  ?[name] a 71 year old male who was recently boarded in the emergency room for 2 months pending placement and change in skilled nursing facility.  Patient was discharged to [facility] on 4/18.  Presented back to the ED on 4/19 with suppose it altered mentation and significant hypotension associated with a four-point drop in his hemoglobin.  Patient does have a history of epistaxis which may be underlying some of the drop in hemoglobin.  No bleeding noted since patient has been hospitalized.  There was initial concern that his presentation at [facility] was consistent with acute alcohol withdrawal.  This is highly unlikely, nearly impossible as the patient has been at [facility] emergency room for 2 months. ?  ?At time of my evaluation patient is resting in bed.  He is chronically ill and frail appearing but in no visible distress.  He is alert and oriented x3.  Negative procalcitonin, normal anemia, downtrending LFTs, afebrile.  Hemoglobin stable.  [name] appropriate for transfer back to skilled nursing facility.  We will reinitiate insurance authorization.  PT, OT, SLP evaluations requested.  TOC reengaged ? ?4/21: Patient medically stable for discharge.  Blood pressure improved.  Patient tolerating p.o. intake.  No nausea vomiting.  Mental status baseline. ? ? ? ? ?Assessment & Plan: ?  ?Principal Problem: ?  Hypotension ?Active Problems: ?  Altered mental status, unspecified ? ?Altered mental status ?Transient.  Suspect secondary to hypotension and intravascular volume depletion.  Ammonia recovered to normal.  No overt evidence of hepatic encephalopathy.  Will not start lactulose.  Patient alert and oriented x3 on my evaluation.  Medically stable for discharge back to skilled nursing facility.  Pending insurance authorization ? ?Hypotension ?Decreased  H&H ?Hemoglobin drop likely secondary to epistaxis.  Stable on my evaluation.  No drop in hemoglobin.  No acute epistaxis noted.  Stool guaiac negative.  Blood pressure is normalized.  Continue daily CBC ? ?Abnormal chest CT with patchy infiltrates and groundglass opacities ?Procalcitonin negative.  Chest CT findings likely represent atelectasis versus scarring/groundglass opacities ? ?History of alcohol use ?[facility] from [facility] back to the emergency room with a presumptive diagnosis of alcohol withdrawal.  This is highly unlikely, nearly impossible as the patient has been boarding in [facility] ED for 2 months and has not had anything to drink since being in [place] or since leaving to the SNF.  No evidence of withdrawal.  Patient medically stable for discharge back to skilled nursing facility ? ?Abnormal LFTs ?Stable, downtrending ? ?Paroxysmal atrial fibrillation ?Patient not on anticoagulation.  Unclear reason.  Probably because he is a fall risk.  We will restart home beta-blocker ? ?Status post CVA with right hemiparesis ?Restart aspirin.  Speech, therapy evaluations.  Stable for discharge back to skilled nursing facility. ? ? ? ?DVT prophylaxis: SCD ?Code Status: Full ?Family Communication: None ?Disposition Plan: Status is: Observation ?The patient remains OBS appropriate and will d/c before 2 midnights. ? ? ?  ?Level of care: Med-Surg ? ?Consultants:  ?None ? ?Procedures:  ?None ? ?Antimicrobials: ?None  ? ? ?Subjective: ?Seen and examined.  Frail appearing, but stable.  NAD.  No complaints ? ?Objective: ?Vitals:  ? 02/20/22 1624 02/20/22 2019 02/21/22 2423 02/21/22 0755  ?BP: (!) 146/79 (!) 151/77 124/67 122/70  ?Pulse: 77 85 71 73  ?Resp: 16 20 18 16   ?Temp:  97.9 ?F (36.6 ?C) 99.3 ?F (37.4 ?C) 98.8 ?F (37.1 ?C) 98 ?F (36.7 ?C)  ?TempSrc:      ?SpO2: 98% 98% 95% 97%  ?Weight:      ?Height:      ? ? ?Intake/Output Summary (Last 24 hours) at 02/21/2022 1132 ?Last data filed at 02/20/2022  0076 ?Gross per 24 hour  ?Intake 60 ml  ?Output 200 ml  ?Net -140 ml  ? ?Filed Weights  ? 02/19/22 1018  ?Weight: 64.6 kg  ? ? ?Examination: ? ?General exam: NAD.  Frail appearing ?Respiratory system: Clear to auscultation. Respiratory effort normal. ?Cardiovascular system: S1-S2, RRR, no murmurs, no pedal edema ?Gastrointestinal system: Thin, soft, NT/ND, normal bowel sounds ?Central nervous system: Alert and oriented. No focal neurological deficits. ?Extremities: Surgical decreased power bilaterally ?Skin: Pale and thin with no obvious rashes or lesions ?Psychiatry: Judgement and insight appear normal. Mood & affect appropriate.  ? ? ? ?Data Reviewed: I have personally reviewed following labs and imaging studies ? ?CBC: ?Recent Labs  ?Lab 02/19/22 ?1139 02/20/22 ?2263  ?WBC 5.6 4.9  ?NEUTROABS 2.6  --   ?HGB 12.4* 13.4  ?HCT 37.4* 40.0  ?MCV 95.2 95.7  ?PLT 111* 98*  ? ?Basic Metabolic Panel: ?Recent Labs  ?Lab 02/19/22 ?1139 02/20/22 ?3354  ?NA 136 135  ?K 3.8 3.6  ?CL 105 105  ?CO2 23 26  ?GLUCOSE 106* 74  ?BUN 18 10  ?CREATININE 0.76 0.61  ?CALCIUM 8.2* 8.1*  ? ?GFR: ?Estimated Creatinine Clearance: 69.1 mL/min (by C-G formula based on SCr of 0.61 mg/dL). ?Liver Function Tests: ?Recent Labs  ?Lab 02/19/22 ?1139 02/20/22 ?5625  ?AST 89* 80*  ?ALT 102* 104*  ?ALKPHOS 70 70  ?BILITOT 0.6 0.7  ?PROT 5.5* 6.0*  ?ALBUMIN 3.0* 3.3*  ? ?No results for input(s): LIPASE, AMYLASE in the last 168 hours. ?Recent Labs  ?Lab 02/19/22 ?1034 02/20/22 ?6389  ?AMMONIA 50* 28  ? ?Coagulation Profile: ?Recent Labs  ?Lab 02/19/22 ?1139  ?INR 1.1  ? ?Cardiac Enzymes: ?No results for input(s): CKTOTAL, CKMB, CKMBINDEX, TROPONINI in the last 168 hours. ?BNP (last 3 results) ?No results for input(s): PROBNP in the last 8760 hours. ?HbA1C: ?No results for input(s): HGBA1C in the last 72 hours. ?CBG: ?No results for input(s): GLUCAP in the last 168 hours. ?Lipid Profile: ?No results for input(s): CHOL, HDL, LDLCALC, TRIG, CHOLHDL,  LDLDIRECT in the last 72 hours. ?Thyroid Function Tests: ?Recent Labs  ?  02/19/22 ?1139  ?TSH 3.283  ? ?Anemia Panel: ?Recent Labs  ?  02/19/22 ?1139  ?TIBC 206*  ?IRON 81  ? ?Sepsis Labs: ?Recent Labs  ?Lab 02/19/22 ?1032 02/19/22 ?1034 02/19/22 ?1125  ?PROCALCITON  --  <0.10  --   ?LATICACIDVEN 1.5  --  1.4  ? ? ?Recent Results (from the past 240 hour(s))  ?Urine Culture     Status: Abnormal  ? Collection Time: 02/15/22  4:09 AM  ? Specimen: Urine, Clean Catch  ?Result Value Ref Range Status  ? Specimen Description   Final  ?  URINE, CLEAN CATCH ?Performed at [facility], [address], [place], [place] [address] ?  ? Special Requests   Final  ?  NONE ?Performed at [facility], [address], [place], [place] [address] ?  ? Culture (A)  Final  ?  >=100,000 COLONIES/mL MULTIPLE SPECIES PRESENT, SUGGEST RECOLLECTION  ? Report Status 02/16/2022 FINAL  Final  ?Blood Culture (routine x 2)     Status: None (Preliminary  result)  ? Collection Time: 02/19/22 10:30 AM  ? Specimen: BLOOD  ?Result Value Ref Range Status  ? Specimen Description BLOOD  Final  ? Special Requests NONE  Final  ? Culture   Final  ?  NO GROWTH 2 DAYS ?Performed at [facility], [address], [place], [place] [address] ?  ? Report Status PENDING  Incomplete  ?Blood Culture (routine x 2)     Status: None (Preliminary result)  ? Collection Time: 02/19/22 10:33 AM  ? Specimen: Leg; Blood  ?Result Value Ref Range Status  ? Specimen Description LEG BLOOD RIGHT LEG  Final  ? Special Requests   Final  ?  BOTTLES DRAWN AEROBIC AND ANAEROBIC Blood Culture adequate volume  ? Culture   Final  ?  NO GROWTH 2 DAYS ?Performed at [facility], [address] [place], [place] [address] ?  ? Report Status PENDING  Incomplete  ?Urine Culture     Status: None  ? Collection Time: 02/19/22 10:34 AM  ? Specimen: In/Out Cath Urine  ?Result Value Ref Range Status  ? Specimen Description   Final  ?  IN/OUT CATH URINE ?Performed  at [facility], [address], [place], [place] [address] ?  ? Special Requests   Final  ?  NONE ?Performed at [facility], [address], [place], [place] [address] ?  ? Culture   Final  ?  NO GROWT

## 2022-02-21 NOTE — TOC Progression Note (Addendum)
Transition of Care (TOC) - Progression Note  ? ? ?Patient Details  ?Name: Douglas Peterson. ?MRN: [id number] ?Date of Birth: 12/18/1950 ? ?Transition of Care (TOC) CM/SW Contact  ? E , LCSW ?Phone Number: ?02/21/2022, 8:59 AM ? ?Clinical Narrative:   Re[facility] out to [facility], asked to be updated if they get auth for patient.  ?10:00- Per [facility], auth is still pending. ?4:20- Asked [facility] for update on auth.  ? ? ?Expected Discharge Plan: [place] ?Barriers to Discharge: Insurance Authorization ? ?Expected Discharge Plan and Services ?Expected Discharge Plan: [place] ?  ?Discharge Planning Services: CM Consult ?Post Acute Care Choice: [place] ?Living arrangements for the past 2 months: [place] ?                ?DME Arranged: N/A ?DME Agency: NA ?  ?  ?  ?HH Arranged: NA ?[place] Agency: NA ?  ?  ?  ? ? ?Social Determinants of Health (SDOH) Interventions ?  ? ?Readmission Risk Interventions ? ?  08/23/2020  ? 11:52 AM 08/19/2020  ?  1:32 PM 08/14/2020  ?  9:43 AM  ?Readmission Risk Prevention Plan  ?Transportation Screening Complete Complete Complete  ?Medication Review Press photographer) Complete Complete   ?PCP or Specialist appointment within 3-5 days of discharge  Complete Complete  ?[place] or Home Care Consult Complete  Complete  ?SW Recovery Care/Counseling Consult  Complete Complete  ?Palliative Care Screening  Not Applicable Not Applicable  ?[place] Not Applicable  Patient Refused  ? ? ?

## 2022-02-21 NOTE — Plan of Care (Signed)

## 2022-02-22 ENCOUNTER — Observation Stay: Payer: Medicare HMO

## 2022-02-22 LAB — BASIC METABOLIC PANEL
Anion gap: 7 (ref 5–15)
BUN: 13 mg/dL (ref 8–23)
CO2: 29 mmol/L (ref 22–32)
Calcium: 9.1 mg/dL (ref 8.9–10.3)
Chloride: 101 mmol/L (ref 98–111)
Creatinine, Ser: 0.69 mg/dL (ref 0.61–1.24)
GFR, Estimated: >60 mL/min (ref >60)
Glucose, Bld: 87 mg/dL (ref 70–99)
Potassium: 3.6 mmol/L (ref 3.5–5.1)
Sodium: 137 mmol/L (ref 135–145)

## 2022-02-22 LAB — CBC
HCT: 41.6 % (ref 39.0–52.0)
Hemoglobin: 13.6 g/dL (ref 13.0–17.0)
MCH: 31.6 pg (ref 26.0–34.0)
MCHC: 32.7 g/dL (ref 30.0–36.0)
MCV: 96.5 fL (ref 80.0–100.0)
Platelets: 147 10*3/uL — ABNORMAL LOW (ref 150–400)
RBC: 4.31 MIL/uL (ref 4.22–5.81)
RDW: 13.3 % (ref 11.5–15.5)
WBC: 5.2 10*3/uL (ref 4.0–10.5)
nRBC: 0 % (ref 0.0–0.2)

## 2022-02-22 LAB — MAGNESIUM: Magnesium: 1.8 mg/dL (ref 1.7–2.4)

## 2022-02-22 MED ORDER — SODIUM CHLORIDE 0.9 % IV SOLN: INTRAVENOUS | Status: DC

## 2022-02-22 MED ORDER — ENOXAPARIN SODIUM 40 MG/0.4ML IJ SOSY
40.0000 mg | PREFILLED_SYRINGE | INTRAMUSCULAR | Status: DC
  Administered 2022-02-22 – 2022-02-23 (×2): 40 mg via SUBCUTANEOUS
  Filled 2022-02-22 (×2): qty 0.4

## 2022-02-22 NOTE — TOC Progression Note (Addendum)
Transition of Care (TOC) - Progression Note  ? ? ?Patient Details  ?Name: Douglas Peterson. ?MRN: [id number] ?Date of Birth: 08/12/1951 ? ?Transition of Care (TOC) CM/SW Contact  ? E , LCSW ?Phone Number: ?02/22/2022, 10:50 AM ? ?Clinical Narrative:   Reached out to [facility] and asked for update on insurance auth. Waiting on response.  ? ?12:00- Per Elza Rafter is still pending. [name] medically ready to DC once we get auth.  ? ? ? ?Expected Discharge Plan: [place] ?Barriers to Discharge: Insurance Authorization ? ?Expected Discharge Plan and Services ?Expected Discharge Plan: [place] ?  ?Discharge Planning Services: CM Consult ?Post Acute Care Choice: [place] ?Living arrangements for the past 2 months: [place] ?                ?DME Arranged: N/A ?DME Agency: NA ?  ?  ?  ?HH Arranged: NA ?[place] Agency: NA ?  ?  ?  ? ? ?Social Determinants of Health (SDOH) Interventions ?  ? ?Readmission Risk Interventions ? ?  08/23/2020  ? 11:52 AM 08/19/2020  ?  1:32 PM 08/14/2020  ?  9:43 AM  ?Readmission Risk Prevention Plan  ?Transportation Screening Complete Complete Complete  ?Medication Review Press photographer) Complete Complete   ?PCP or Specialist appointment within 3-5 days of discharge  Complete Complete  ?[place] or Home Care Consult Complete  Complete  ?SW Recovery Care/Counseling Consult  Complete Complete  ?Palliative Care Screening  Not Applicable Not Applicable  ?[place] Not Applicable  Patient Refused  ? ? ?

## 2022-02-22 NOTE — Progress Notes (Signed)
Brief update note.  No charge ? ?Patient remains medically stable for discharge.  Pending insurance authorization so patient can return to [facility].  Mental status baseline.  Patient did endorse some shortness of breath this morning.  Chest x-ray with basilar atelectasis.  No evidence of pneumonia or pulmonary edema.  Labs stable.  Can discharge back to [facility] when insurance authorization is obtained. ? ?Discussed with TOC ? ?Ralene Muskrat MD ? ?No charge ?

## 2022-02-23 NOTE — Plan of Care (Signed)

## 2022-02-23 NOTE — TOC Progression Note (Signed)
Transition of Care (TOC) - Progression Note  ? ? ?Patient Details  ?Name: Douglas Peterson. ?MRN: [id number] ?Date of Birth: 01-19-1951 ? ?Transition of Care (TOC) CM/SW Contact  ? E , LCSW ?Phone Number: ?02/23/2022, 2:48 PM ? ?Clinical Narrative:   Re[facility] to [facility] to inquire about insurance auth. Reminded her patient has been medically ready x multiple days.  ? ? ?Expected Discharge Plan: [place] ?Barriers to Discharge: Insurance Authorization ? ?Expected Discharge Plan and Services ?Expected Discharge Plan: [place] ?  ?Discharge Planning Services: CM Consult ?Post Acute Care Choice: [place] ?Living arrangements for the past 2 months: [place] ?                ?DME Arranged: N/A ?DME Agency: NA ?  ?  ?  ?HH Arranged: NA ?[place] Agency: NA ?  ?  ?  ? ? ?Social Determinants of Health (SDOH) Interventions ?  ? ?Readmission Risk Interventions ? ?  08/23/2020  ? 11:52 AM 08/19/2020  ?  1:32 PM 08/14/2020  ?  9:43 AM  ?Readmission Risk Prevention Plan  ?Transportation Screening Complete Complete Complete  ?Medication Review Press photographer) Complete Complete   ?PCP or Specialist appointment within 3-5 days of discharge  Complete Complete  ?[place] or Home Care Consult Complete  Complete  ?SW Recovery Care/Counseling Consult  Complete Complete  ?Palliative Care Screening  Not Applicable Not Applicable  ?[place] Not Applicable  Patient Refused  ? ? ?

## 2022-02-23 NOTE — Progress Notes (Signed)
Brief update note.  No charge ?  ?Patient remains medically stable for discharge.  Pending insurance authorization so patient can return to [facility].  Mental status baseline.  Patient did endorse some shortness of breath yesterday.  Chest x-ray with basilar atelectasis.  No evidence of pneumonia or pulmonary edema.  Labs stable.  Can [facility] back to [facility] when insurance authorization is obtained. ?  ?Discussed with TOC ?  ?Ralene Muskrat MD ?  ?No charge ? ? ? ? ? ?

## 2022-02-24 DIAGNOSIS — I959 Hypotension, unspecified: Secondary | ICD-10-CM | POA: Diagnosis not present

## 2022-02-24 LAB — CULTURE, BLOOD (ROUTINE X 2)
Culture: NO GROWTH
Culture: NO GROWTH
Special Requests: ADEQUATE

## 2022-02-24 NOTE — Progress Notes (Signed)
Attempt X2 to call report to [facility], left info for call back. ?

## 2022-02-24 NOTE — Discharge Summary (Signed)
Physician Discharge Summary  ?Douglas Peterson. JAS:[id number] DOB: 09-04-1951 DOA: 02/19/2022 ? ?PCP: Eda Paschal, MD ? ?Admit date: 02/19/2022 ?Discharge date: 02/24/2022 ? ?Admitted From: SNF ?Disposition:  SNF ? ?Recommendations for Outpatient Follow-up:  ?Follow up with PCP in 1-2 weeks ? ? ?Home Health:No ?Equipment/Devices:None  ? ?Discharge Condition:Stable  ?CODE STATUS:FULL  ?Diet recommendation: Regular ? ?Brief/Interim Summary: ?[name] a 71 year old male who was recently boarded in the emergency room for 2 months pending placement and change in skilled nursing facility.  [facility] discharged to [facility] on 4/18.  Presented back to the ED on 4/19 with suppose it altered mentation and significant hypotension associated with a four-point drop in his hemoglobin.  Patient does have a history of epistaxis which may be underlying some of the drop in hemoglobin.  No bleeding noted since patient has been hospitalized.  There was initial concern that his presentation at [facility] was consistent with acute alcohol withdrawal.  This is highly unlikely, nearly impossible as the patient has been at [facility] emergency room for 2 months. ?  ?At time of my evaluation patient is resting in bed.  He is chronically ill and frail appearing but in no visible distress.  He is alert and oriented x3.  Negative procalcitonin, normal anemia, downtrending LFTs, afebrile.  Hemoglobin stable.  [name] appropriate for transfer back to skilled nursing facility.  We will reinitiate insurance authorization.  PT, OT, SLP evaluations requested.  TOC reengaged ?  ?4/21: Patient medically stable for discharge.  Blood pressure improved.  Patient tolerating p.o. intake.  No nausea vomiting.  Mental status baseline. ? ?4/24: Discharge delayed due to pending insurance authorization.  Patient medically stable for return to SNF ? ? ? ?Discharge Diagnoses:  ?Principal Problem: ?  Hypotension ?Active Problems: ?  Altered mental  status, unspecified ? ?Altered mental status ?Transient.  Suspect secondary to hypotension and intravascular volume depletion.  Ammonia recovered to normal.  No overt evidence of hepatic encephalopathy.  Will not start lactulose.  Patient alert and oriented x3 on my evaluation.  Medically stable for discharge back to skilled nursing facility.  Pending insurance authorization ?  ?Hypotension ?Decreased H&H ?Hemoglobin drop likely secondary to epistaxis.  Stable on my evaluation.  No drop in hemoglobin.  No acute epistaxis noted.  Stool guaiac negative.  Blood pressure is normalized.   ?  ?Abnormal chest CT with patchy infiltrates and groundglass opacities ?Procalcitonin negative.  Chest CT findings likely represent atelectasis versus scarring/groundglass opacities ?  ?History of alcohol use ?[facility] to the emergency room with a presumptive diagnosis of alcohol withdrawal.  This is highly unlikely, nearly impossible as the patient has been boarding in [facility] ED for 2 months and has not had anything to drink since being in [place] or since leaving to the SNF.  No evidence of withdrawal.  Patient medically stable for discharge back to skilled nursing facility ?  ?Abnormal LFTs ?Stable, downtrending ?  ?Paroxysmal atrial fibrillation ?Patient not on anticoagulation.  Unclear reason.  Probably because he is a fall risk.  We will restart home beta-blocker ?  ?Status post CVA with right hemiparesis ?Restart aspirin.  Speech, therapy evaluations.  Stable for discharge back to skilled nursing facility. ? ?Discharge Instructions ? ?Discharge Instructions   ? ? Diet - low sodium heart healthy   Complete by: As directed ?  ? Increase activity slowly   Complete by: As directed ?  ? ?  ? ?Allergies  as of 02/24/2022   ? ?   Reactions  ? Hydrochlorothiazide Other (See Comments)  ? Hyponatremia  ? ?  ? ?  ?Medication List  ?  ? ?STOP taking these medications   ? ?senna 8.6 MG Tabs  tablet ?Commonly known as: SENOKOT ?  ? ?  ? ?TAKE these medications   ? ?acetaminophen 500 MG tablet ?Commonly known as: TYLENOL ?Take 1 tablet (500 mg total) by mouth every 8 (eight) hours as needed for mild pain or fever. ?  ?aspirin EC 81 MG tablet ?Take 1 tablet (81 mg total) by mouth daily. Swallow whole. ?What changed: Another medication with the same name was removed. Continue taking this medication, and follow the directions you see here. ?  ?atenolol 25 MG tablet ?Commonly known as: TENORMIN ?Take 1 tablet (25 mg total) by mouth 2 (two) times daily. ?  ?atorvastatin 10 MG tablet ?Commonly known as: LIPITOR ?Take 1 tablet (10 mg total) by mouth daily. ?  ?baclofen 10 MG tablet ?Commonly known as: LIORESAL ?Take 1 tablet (10 mg total) by mouth 3 (three) times daily. ?  ?Cholecalciferol 125 MCG (5000 UT) Tabs ?Take 1 tablet (5,000 Units total) by mouth daily. ?  ?Clenpiq 10-3.5-12 MG-GM -GM/160ML Soln ?Generic drug: Sod Picosulfate-Mag Ox-Cit Acd ?Take 320 mLs by mouth as directed. ?  ?doxepin 10 MG capsule ?Commonly known as: SINEQUAN ?Take 1 capsule (10 mg total) by mouth at bedtime as needed (sleep). ?  ?feeding supplement Liqd ?Take 237 mLs by mouth 2 (two) times daily between meals. ?  ?folic acid 1 MG tablet ?Commonly known as: FOLVITE ?Take 1 tablet (1 mg total) by mouth daily. ?  ?hydrOXYzine 25 MG tablet ?Commonly known as: ATARAX ?Take 1 tablet (25 mg total) by mouth 3 (three) times daily as needed for anxiety or itching. ?  ?multivitamin with minerals Tabs tablet ?Take 1 tablet by mouth daily. ?  ?omeprazole 10 MG capsule ?Commonly known as: PRILOSEC ?Take 10 mg by mouth daily. ?  ?ondansetron 4 MG tablet ?Commonly known as: ZOFRAN ?Take 4 mg by mouth every 8 (eight) hours as needed. ?  ?Oyster Shell Calcium w/D 500-200 MG-UNIT Tabs ?Take 1 tablet by mouth daily as needed. ?  ?PARoxetine 30 MG tablet ?Commonly known as: PAXIL ?Take 1 tablet (30 mg total) by mouth daily. ?  ?polyethylene glycol  powder 17 GM/SCOOP powder ?Commonly known as: GLYCOLAX/MIRALAX ?Take 17 g by mouth daily. ?  ?senna-docusate 8.6-50 MG tablet ?Commonly known as: Senokot-S ?Take 2 tablets by mouth at bedtime. ?  ?tamsulosin 0.4 MG Caps capsule ?Commonly known as: FLOMAX ?Take 0.4 mg by mouth daily. ?  ?thiamine 100 MG tablet ?Take 1 tablet (100 mg total) by mouth daily. ?  ?tiZANidine 4 MG tablet ?Commonly known as: ZANAFLEX ?Take 8 mg by mouth 2 (two) times daily. ?What changed: Another medication with the same name was removed. Continue taking this medication, and follow the directions you see here. ?  ?vitamin C 1000 MG tablet ?Take 1 tablet (1,000 mg total) by mouth daily. ?  ? ?  ? ? ?Allergies  ?Allergen Reactions  ? Hydrochlorothiazide Other (See Comments)  ?  Hyponatremia  ? ? ?Consultations: ?None ? ? ?Procedures/Studies: ?CT HEAD WO CONTRAST (5MM) ? ?Result Date: 02/19/2022 ?CLINICAL DATA:  Mental status change, unknown cause EXAM: CT HEAD WITHOUT CONTRAST TECHNIQUE: Contiguous axial images were obtained from the base of the skull through the vertex without intravenous contrast. RADIATION DOSE REDUCTION: This exam was performed  according to the departmental dose-optimization program which includes automated exposure control, adjustment of the mA and/or kV according to patient size and/or use of iterative reconstruction technique. COMPARISON:  CT head February 14, 2022. FINDINGS: Brain: No evidence of acute infarction, hemorrhage, hydrocephalus, extra-axial collection or mass lesion/mass effect. Similar cerebral atrophy with ex vacuo ventricular dilation. Vascular: No hyperdense vessel identified. Calcific intracranial atherosclerosis. Skull: No acute fracture Sinuses/Orbits: Clear sinuses.  No acute orbital findings. Other: Trace right mastoid effusion. IMPRESSION: 1. No evidence of acute intracranial abnormality. 2.  Cerebral atrophy (ICD10-G31.9). Electronically Signed   By: Margaretha Sheffield M.D.   On: 02/19/2022 10:54   ? ?CT HEAD WO CONTRAST (5MM) ? ?Result Date: 02/14/2022 ?CLINICAL DATA:  Altered mental status. EXAM: CT HEAD WITHOUT CONTRAST TECHNIQUE: Contiguous axial images were obtained from the base of the skull through the vertex

## 2022-02-24 NOTE — TOC Progression Note (Signed)
Transition of Care (TOC) - Progression Note  ? ? ?Patient Details  ?Name: Douglas Peterson. ?MRN: [id number] ?Date of Birth: 1951/09/28 ? ?Transition of Care (TOC) CM/SW Contact  ?Conception Oms, RN ?Phone Number: ?02/24/2022, 9:13 AM ? ?Clinical Narrative:    ?Patient will dc to [facility] room 603B ?To be transported by EMS ?Called EMS to transport he will be next on list to be picked up, I attempted to call th daughter [name], unable to reach, left a General VM for a call back ? ? ?Expected Discharge Plan: [place] ?Barriers to Discharge: Insurance Authorization ? ?Expected Discharge Plan and Services ?Expected Discharge Plan: [place] ?  ?Discharge Planning Services: CM Consult ?Post Acute Care Choice: [place] ?Living arrangements for the past 2 months: [place] ?Expected Discharge Date: 02/24/22               ?DME Arranged: N/A ?DME Agency: NA ?  ?  ?  ?HH Arranged: NA ?[place] Agency: NA ?  ?  ?  ? ? ?Social Determinants of Health (SDOH) Interventions ?  ? ?Readmission Risk Interventions ? ?  08/23/2020  ? 11:52 AM 08/19/2020  ?  1:32 PM 08/14/2020  ?  9:43 AM  ?Readmission Risk Prevention Plan  ?Transportation Screening Complete Complete Complete  ?Medication Review Press photographer) Complete Complete   ?PCP or Specialist appointment within 3-5 days of discharge  Complete Complete  ?[place] or Home Care Consult Complete  Complete  ?SW Recovery Care/Counseling Consult  Complete Complete  ?Palliative Care Screening  Not Applicable Not Applicable  ?[place] Not Applicable  Patient Refused  ? ? ?

## 2022-02-24 NOTE — Plan of Care (Signed)
?  Problem: Clinical Measurements: ?Goal: Ability to maintain clinical measurements within normal limits will improve ?Outcome: Progressing ?Goal: Will remain free from infection ?Outcome: Progressing ?Goal: Diagnostic test results will improve ?Outcome: Progressing ?Goal: Respiratory complications will improve ?Outcome: Progressing ?Goal: Cardiovascular complication will be avoided ?Outcome: Progressing ?  ?Problem: Pain Managment: ?Goal: General experience of comfort will improve ?Outcome: Progressing ?  ?Problem: Elimination: ?Goal: Will not experience complications related to bowel motility ?Outcome: Progressing ?Goal: Will not experience complications related to urinary retention ?Outcome: Progressing ?  ?

## 2022-03-13 ENCOUNTER — Encounter: Payer: Self-pay | Admitting: Intensive Care

## 2022-03-13 ENCOUNTER — Emergency Department: Payer: Medicare HMO

## 2022-03-13 ENCOUNTER — Inpatient Hospital Stay
Admission: EM | Admit: 2022-03-13 | Discharge: 2022-03-15 | DRG: 194 | Disposition: A | Discharge status: Home / Self Care | Payer: Medicare HMO | Attending: Internal Medicine | Admitting: Internal Medicine

## 2022-03-13 ENCOUNTER — Other Ambulatory Visit: Payer: Self-pay

## 2022-03-13 DIAGNOSIS — J189 Pneumonia, unspecified organism: Secondary | ICD-10-CM

## 2022-03-13 DIAGNOSIS — W19XXXA Unspecified fall, initial encounter: Secondary | ICD-10-CM | POA: Diagnosis present

## 2022-03-13 DIAGNOSIS — Z888 Allergy status to other drugs, medicaments and biological substances status: Secondary | ICD-10-CM

## 2022-03-13 DIAGNOSIS — E872 Acidosis, unspecified: Secondary | ICD-10-CM | POA: Diagnosis present

## 2022-03-13 DIAGNOSIS — Z87891 Personal history of nicotine dependence: Secondary | ICD-10-CM

## 2022-03-13 DIAGNOSIS — E785 Hyperlipidemia, unspecified: Secondary | ICD-10-CM | POA: Diagnosis present

## 2022-03-13 DIAGNOSIS — F419 Anxiety disorder, unspecified: Secondary | ICD-10-CM | POA: Diagnosis present

## 2022-03-13 DIAGNOSIS — Z79899 Other long term (current) drug therapy: Secondary | ICD-10-CM | POA: Diagnosis not present

## 2022-03-13 DIAGNOSIS — Z87820 Personal history of traumatic brain injury: Secondary | ICD-10-CM | POA: Diagnosis not present

## 2022-03-13 DIAGNOSIS — Y92009 Unspecified place in unspecified non-institutional (private) residence as the place of occurrence of the external cause: Secondary | ICD-10-CM

## 2022-03-13 DIAGNOSIS — N400 Enlarged prostate without lower urinary tract symptoms: Secondary | ICD-10-CM | POA: Diagnosis present

## 2022-03-13 DIAGNOSIS — K219 Gastro-esophageal reflux disease without esophagitis: Secondary | ICD-10-CM | POA: Diagnosis present

## 2022-03-13 DIAGNOSIS — F32A Depression, unspecified: Secondary | ICD-10-CM | POA: Diagnosis present

## 2022-03-13 DIAGNOSIS — Z8249 Family history of ischemic heart disease and other diseases of the circulatory system: Secondary | ICD-10-CM | POA: Diagnosis not present

## 2022-03-13 DIAGNOSIS — Z7982 Long term (current) use of aspirin: Secondary | ICD-10-CM | POA: Diagnosis not present

## 2022-03-13 DIAGNOSIS — Z20822 Contact with and (suspected) exposure to covid-19: Secondary | ICD-10-CM | POA: Diagnosis present

## 2022-03-13 DIAGNOSIS — R531 Weakness: Secondary | ICD-10-CM

## 2022-03-13 DIAGNOSIS — D696 Thrombocytopenia, unspecified: Secondary | ICD-10-CM | POA: Diagnosis present

## 2022-03-13 DIAGNOSIS — Y95 Nosocomial condition: Secondary | ICD-10-CM | POA: Diagnosis present

## 2022-03-13 DIAGNOSIS — I10 Essential (primary) hypertension: Secondary | ICD-10-CM | POA: Diagnosis present

## 2022-03-13 DIAGNOSIS — I480 Paroxysmal atrial fibrillation: Secondary | ICD-10-CM | POA: Diagnosis present

## 2022-03-13 DIAGNOSIS — A419 Sepsis, unspecified organism: Secondary | ICD-10-CM

## 2022-03-13 HISTORY — DX: Pneumonia, unspecified organism: J18.9

## 2022-03-13 LAB — URINALYSIS, ROUTINE W REFLEX MICROSCOPIC
Bilirubin Urine: NEGATIVE
Glucose, UA: NEGATIVE mg/dL
Ketones, ur: 20 mg/dL — AB
Leukocytes,Ua: NEGATIVE
Nitrite: NEGATIVE
Protein, ur: 100 mg/dL — AB
Specific Gravity, Urine: 1.015 (ref 1.005–1.030)
pH: 6 (ref 5.0–8.0)

## 2022-03-13 LAB — COMPREHENSIVE METABOLIC PANEL
ALT: 37 U/L (ref 0–44)
AST: 60 U/L — ABNORMAL HIGH (ref 15–41)
Albumin: 4.5 g/dL (ref 3.5–5.0)
Alkaline Phosphatase: 77 U/L (ref 38–126)
Anion gap: 17 — ABNORMAL HIGH (ref 5–15)
BUN: 10 mg/dL (ref 8–23)
CO2: 21 mmol/L — ABNORMAL LOW (ref 22–32)
Calcium: 9 mg/dL (ref 8.9–10.3)
Chloride: 93 mmol/L — ABNORMAL LOW (ref 98–111)
Creatinine, Ser: 0.81 mg/dL (ref 0.61–1.24)
GFR, Estimated: >60 mL/min (ref >60)
Glucose, Bld: 75 mg/dL (ref 70–99)
Potassium: 4.7 mmol/L (ref 3.5–5.1)
Sodium: 131 mmol/L — ABNORMAL LOW (ref 135–145)
Total Bilirubin: 1.8 mg/dL — ABNORMAL HIGH (ref 0.3–1.2)
Total Protein: 8 g/dL (ref 6.5–8.1)

## 2022-03-13 LAB — LACTIC ACID, PLASMA
Lactic Acid, Venous: 3.9 mmol/L (ref 0.5–1.9)
Lactic Acid, Venous: 3.5 mmol/L (ref 0.5–1.9)
Lactic Acid, Venous: 1.1 mmol/L (ref 0.5–1.9)

## 2022-03-13 LAB — CBC
HCT: 43.7 % (ref 39.0–52.0)
Hemoglobin: 15 g/dL (ref 13.0–17.0)
MCH: 31.2 pg (ref 26.0–34.0)
MCHC: 34.3 g/dL (ref 30.0–36.0)
MCV: 90.9 fL (ref 80.0–100.0)
Platelets: 221 10*3/uL (ref 150–400)
RBC: 4.81 MIL/uL (ref 4.22–5.81)
RDW: 12.7 % (ref 11.5–15.5)
WBC: 11.4 10*3/uL — ABNORMAL HIGH (ref 4.0–10.5)
nRBC: 0 % (ref 0.0–0.2)

## 2022-03-13 LAB — TROPONIN I (HIGH SENSITIVITY)
Troponin I (High Sensitivity): 5 ng/L (ref <18)
Troponin I (High Sensitivity): 5 ng/L (ref <18)

## 2022-03-13 LAB — RESP PANEL BY RT-PCR (RSV, FLU A&B, COVID)  RVPGX2
Influenza A by PCR: NEGATIVE
Influenza B by PCR: NEGATIVE
Resp Syncytial Virus by PCR: NEGATIVE
SARS Coronavirus 2 by RT PCR: NEGATIVE

## 2022-03-13 MED ORDER — IOHEXOL 350 MG/ML SOLN
75.0000 mL | Freq: Once | INTRAVENOUS | Status: AC | PRN
  Administered 2022-03-13: 75 mL via INTRAVENOUS

## 2022-03-13 MED ORDER — HYDRALAZINE HCL 20 MG/ML IJ SOLN: 5.0000 mg | Freq: Four times a day (QID) | INTRAMUSCULAR | Status: DC | PRN

## 2022-03-13 MED ORDER — CEFTRIAXONE SODIUM 1 G IJ SOLR
1.0000 g | Freq: Once | INTRAMUSCULAR | Status: AC
Start: 2022-03-13 — End: 2022-03-13
  Administered 2022-03-13: 1 g via INTRAMUSCULAR
  Filled 2022-03-13: qty 10

## 2022-03-13 MED ORDER — ONDANSETRON HCL 4 MG/2ML IJ SOLN
4.0000 mg | Freq: Four times a day (QID) | INTRAMUSCULAR | Status: DC | PRN
  Administered 2022-03-13: 4 mg via INTRAVENOUS
  Filled 2022-03-13: qty 2

## 2022-03-13 MED ORDER — THIAMINE HCL 100 MG PO TABS
100.0000 mg | ORAL_TABLET | Freq: Every day | ORAL | Status: DC
  Administered 2022-03-13 – 2022-03-15 (×3): 100 mg via ORAL
  Filled 2022-03-13 (×3): qty 1

## 2022-03-13 MED ORDER — ASPIRIN EC 81 MG PO TBEC
81.0000 mg | DELAYED_RELEASE_TABLET | Freq: Every day | ORAL | Status: DC
  Administered 2022-03-13 – 2022-03-15 (×3): 81 mg via ORAL
  Filled 2022-03-13 (×3): qty 1

## 2022-03-13 MED ORDER — HYDROXYZINE HCL 25 MG PO TABS
25.0000 mg | ORAL_TABLET | Freq: Three times a day (TID) | ORAL | Status: DC | PRN
  Administered 2022-03-13 – 2022-03-14 (×3): 25 mg via ORAL
  Filled 2022-03-13 (×5): qty 1

## 2022-03-13 MED ORDER — MELATONIN 5 MG PO TABS
5.0000 mg | ORAL_TABLET | Freq: Every evening | ORAL | Status: DC | PRN
  Administered 2022-03-14: 5 mg via ORAL
  Filled 2022-03-13: qty 1

## 2022-03-13 MED ORDER — ACETAMINOPHEN 325 MG PO TABS
650.0000 mg | ORAL_TABLET | Freq: Four times a day (QID) | ORAL | Status: DC | PRN
  Administered 2022-03-15: 650 mg via ORAL
  Filled 2022-03-13: qty 2

## 2022-03-13 MED ORDER — PANTOPRAZOLE SODIUM 40 MG PO TBEC
40.0000 mg | DELAYED_RELEASE_TABLET | Freq: Every day | ORAL | Status: DC
Start: 2022-03-13 — End: 2022-03-15
  Administered 2022-03-13 – 2022-03-15 (×3): 40 mg via ORAL
  Filled 2022-03-13 (×3): qty 1

## 2022-03-13 MED ORDER — LACTATED RINGERS IV SOLN
INTRAVENOUS | Status: DC
Start: 2022-03-13 — End: 2022-03-13

## 2022-03-13 MED ORDER — POLYETHYLENE GLYCOL 3350 17 G PO PACK
17.0000 g | PACK | Freq: Every day | ORAL | Status: DC
  Administered 2022-03-14: 17 g via ORAL
  Filled 2022-03-13 (×2): qty 1

## 2022-03-13 MED ORDER — FOLIC ACID 1 MG PO TABS
1.0000 mg | ORAL_TABLET | Freq: Every day | ORAL | Status: DC
  Administered 2022-03-13 – 2022-03-15 (×3): 1 mg via ORAL
  Filled 2022-03-13 (×3): qty 1

## 2022-03-13 MED ORDER — TAMSULOSIN HCL 0.4 MG PO CAPS
0.4000 mg | ORAL_CAPSULE | Freq: Every day | ORAL | Status: DC
  Administered 2022-03-13 – 2022-03-15 (×3): 0.4 mg via ORAL
  Filled 2022-03-13 (×3): qty 1

## 2022-03-13 MED ORDER — SODIUM CHLORIDE 0.9 % IV SOLN: INTRAVENOUS | Status: DC

## 2022-03-13 MED ORDER — ATENOLOL 25 MG PO TABS
25.0000 mg | ORAL_TABLET | Freq: Two times a day (BID) | ORAL | Status: DC
Start: 2022-03-13 — End: 2022-03-15
  Administered 2022-03-13 – 2022-03-15 (×4): 25 mg via ORAL
  Filled 2022-03-13 (×4): qty 1

## 2022-03-13 MED ORDER — ENOXAPARIN SODIUM 40 MG/0.4ML IJ SOSY
40.0000 mg | PREFILLED_SYRINGE | INTRAMUSCULAR | Status: DC
  Administered 2022-03-13 – 2022-03-14 (×2): 40 mg via SUBCUTANEOUS
  Filled 2022-03-13 (×2): qty 0.4

## 2022-03-13 MED ORDER — ATORVASTATIN CALCIUM 10 MG PO TABS
10.0000 mg | ORAL_TABLET | Freq: Every day | ORAL | Status: DC
  Administered 2022-03-13 – 2022-03-15 (×3): 10 mg via ORAL
  Filled 2022-03-13 (×3): qty 1

## 2022-03-13 MED ORDER — SODIUM CHLORIDE 0.9 % IV SOLN
2.0000 g | Freq: Three times a day (TID) | INTRAVENOUS | Status: DC
  Administered 2022-03-13 – 2022-03-15 (×5): 2 g via INTRAVENOUS
  Filled 2022-03-13 (×2): qty 12.5
  Filled 2022-03-13 (×2): qty 2
  Filled 2022-03-13: qty 12.5
  Filled 2022-03-13: qty 2

## 2022-03-13 MED ORDER — SODIUM CHLORIDE 0.9 % IV BOLUS
1000.0000 mL | Freq: Once | INTRAVENOUS | Status: AC
  Administered 2022-03-13: 1000 mL via INTRAVENOUS

## 2022-03-13 MED ORDER — POLYETHYLENE GLYCOL 3350 17 G PO PACK: 17.0000 g | PACK | Freq: Every day | ORAL | Status: DC | PRN

## 2022-03-13 MED ORDER — POLYETHYLENE GLYCOL 3350 17 GM/SCOOP PO POWD: 17.0000 g | Freq: Every day | ORAL | Status: DC

## 2022-03-13 MED ORDER — SODIUM CHLORIDE 0.9 % IV SOLN
500.0000 mg | Freq: Once | INTRAVENOUS | Status: AC
  Administered 2022-03-13: 500 mg via INTRAVENOUS
  Filled 2022-03-13: qty 5

## 2022-03-13 NOTE — ED Notes (Signed)
Took pt to bathroom, unable to provide urine sample. ?

## 2022-03-13 NOTE — ED Provider Notes (Signed)
? ? ?[facility] ?Emergency Department Provider Note ? ? ? ? Event Date/Time  ? First MD Initiated Contact with Patient 03/13/22 1227   ?  (approximate) ? ? ?History  ? ?Fall ? ? ?HPI ? ?Douglas Peterson. is a 71 y.o. male history of hypertension, alcohol use disorder, paroxysmal A-fib, GERD, abnormal liver enzymes, presents to the ED from home via EMS.  Patient reports that he is intoxicated.  He reports a fall at home, noting bilateral hip pain, dry mouth, and sore throat.  Patient also endorse generalized weakness and right lower abdominal pain. ? ? ?Physical Exam  ? ?Triage Vital Signs: ?ED Triage Vitals  ?Enc Vitals Group  ?   BP 03/13/22 1158 109/72  ?   Pulse Rate 03/13/22 1158 78  ?   Resp 03/13/22 1158 (!) 22  ?   Temp 03/13/22 1158 98 ?F (36.7 ?C)  ?   Temp Source 03/13/22 1158 Oral  ?   SpO2 03/13/22 1158 (!) 88 %  ?   Weight 03/13/22 1151 142 lb 6.7 oz (64.6 kg)  ?   Height 03/13/22 1151 5' 3"  (1.6 m)  ?   Head Circumference --   ?   [facility] --   ?   Pain Score 03/13/22 1151 5  ?   Pain Loc --   ?   Pain Edu? --   ?   Excl. in [place]? --   ? ? ?Most recent vital signs: ?Vitals:  ? 03/13/22 1414 03/13/22 1821  ?BP: 109/62 (!) 159/80  ?Pulse: 68 73  ?Resp: 16 16  ?Temp:  98.1 ?F (36.7 ?C)  ?SpO2: 100% 96%  ? ? ?General Awake, no distress. alert ?HEENT NCAT. PERRL. EOMI. No rhinorrhea. Mucous membranes are moist.  ?CV:  Good peripheral perfusion. RRR ?RESP:  Normal effort. Decreased breath sounds ?ABD:  No distention. Soft, minimally tender to the RLQ ?SKIN:  Warm, dry. No edema distally ? ?ED Results / Procedures / Treatments  ? ?Labs ?(all labs ordered are listed, but only abnormal results are displayed) ?Labs Reviewed  ?CBC - Abnormal; Notable for the following components:  ?    Result Value  ? WBC 11.4 (*)   ? All other components within normal limits  ?URINALYSIS, ROUTINE W REFLEX MICROSCOPIC - Abnormal; Notable for the following components:  ? Color, Urine YELLOW (*)   ? APPearance  HAZY (*)   ? Hgb urine dipstick SMALL (*)   ? Ketones, ur 20 (*)   ? Protein, ur 100 (*)   ? Bacteria, UA RARE (*)   ? All other components within normal limits  ?LACTIC ACID, PLASMA - Abnormal; Notable for the following components:  ? Lactic Acid, Venous 3.9 (*)   ? All other components within normal limits  ?LACTIC ACID, PLASMA - Abnormal; Notable for the following components:  ? Lactic Acid, Venous 3.5 (*)   ? All other components within normal limits  ?COMPREHENSIVE METABOLIC PANEL - Abnormal; Notable for the following components:  ? Sodium 131 (*)   ? Chloride 93 (*)   ? CO2 21 (*)   ? AST 60 (*)   ? Total Bilirubin 1.8 (*)   ? Anion gap 17 (*)   ? All other components within normal limits  ?RESP PANEL BY RT-PCR (RSV, FLU A&B, COVID)  [id number]  ?TROPONIN I (HIGH SENSITIVITY)  ?TROPONIN I (HIGH SENSITIVITY)  ? ? ? ?EKG ? ?Vent. rate 76 BPM ?PR interval 178 ms ?QRS  duration 74 ms ?QT/QTcB 386/434 ms ?P-R-T axes 45 7 45 ?NSR ? ?RADIOLOGY ? ?I personally viewed and evaluated these images as part of my medical decision making, as well as reviewing the written report by the radiologist. ? ?ED Provider Interpretation: no acute fractures. No PE} ? ?CT HEAD WO CONTRAST (5MM) ? ?Result Date: 03/13/2022 ?CLINICAL DATA:  Fall, head injury EXAM: CT HEAD WITHOUT CONTRAST CT CERVICAL SPINE WITHOUT CONTRAST TECHNIQUE: Multidetector CT imaging of the head and cervical spine was performed following the standard protocol without intravenous contrast. Multiplanar CT image reconstructions of the cervical spine were also generated. RADIATION DOSE REDUCTION: This exam was performed according to the departmental dose-optimization program which includes automated exposure control, adjustment of the mA and/or kV according to patient size and/or use of iterative reconstruction technique. COMPARISON:  CT head 02/19/2022 FINDINGS: CT HEAD FINDINGS Brain: Mild atrophy.  Mild white matter hypodensity bilaterally. Negative for acute infarct,  hemorrhage, mass Vascular: Negative for hyperdense vessel Skull: Negative Sinuses/Orbits: Negative Other: None CT CERVICAL SPINE FINDINGS Alignment: Normal Skull base and vertebrae: Negative for fracture Soft tissues and spinal canal: No soft tissue mass. Mild subcutaneous edema in the left neck possibly due to contusion. Disc levels: Multilevel disc and facet degeneration. Mild foraminal narrowing bilaterally C3-4 Upper chest: Lung apices clear bilaterally Other: None IMPRESSION: 1. No acute intracranial abnormality. Atrophy and mild chronic microvascular ischemia 2. Cervical spondylosis.  Negative for fracture. Electronically Signed   By: Franchot Gallo M.D.   On: 03/13/2022 15:11  ? ?CT Angio Chest PE W and/or Wo Contrast ? ?Result Date: 03/13/2022 ?CLINICAL DATA:  Unwitnessed fall. EXAM: CT ANGIOGRAPHY CHEST CT ABDOMEN AND PELVIS WITH CONTRAST TECHNIQUE: Multidetector CT imaging of the chest was performed using the standard protocol during bolus administration of intravenous contrast. Multiplanar CT image reconstructions and MIPs were obtained to evaluate the vascular anatomy. Multidetector CT imaging of the abdomen and pelvis was performed using the standard protocol during bolus administration of intravenous contrast. RADIATION DOSE REDUCTION: This exam was performed according to the departmental dose-optimization program which includes automated exposure control, adjustment of the mA and/or kV according to patient size and/or use of iterative reconstruction technique. CONTRAST:  72m OMNIPAQUE IOHEXOL 350 MG/ML SOLN COMPARISON:  02/19/2022 FINDINGS: CTA CHEST FINDINGS Cardiovascular: The heart size is normal. No substantial pericardial effusion. Mild atherosclerotic calcification is noted in the wall of the thoracic aorta. There is no filling defect within the opacified pulmonary arteries to suggest the presence of an acute pulmonary embolus. Mediastinum/Nodes: No mediastinal lymphadenopathy. There is no hilar  lymphadenopathy. The esophagus has normal imaging features. There is no axillary lymphadenopathy. Lungs/Pleura: Similar collapse/consolidative opacity in the posterior right lower lobe adjacent asymmetric elevation of the right hemidiaphragm. Gross may reflect chronic atelectasis/scarring, superimposed component of pneumonia cannot be excluded. No new suspicious pulmonary nodule or mass. No focal airspace consolidation. No pleural effusion. Musculoskeletal: No worrisome lytic or sclerotic osseous abnormality. Mild superior endplate compression deformity at T3 is stable. Mild compression deformity at T8 is unchanged. Review of the MIP images confirms the above findings. CT ABDOMEN and PELVIS FINDINGS Hepatobiliary: The liver shows diffusely decreased attenuation suggesting fat deposition. This is best appreciated on the CTA chest. No suspicious focal abnormality within the liver parenchyma. There is no evidence for gallstones, gallbladder wall thickening, or pericholecystic fluid. No intrahepatic or extrahepatic biliary dilation. Pancreas: No focal mass lesion. No dilatation of the main duct. No intraparenchymal cyst. No peripancreatic edema. Spleen: No splenomegaly. No focal mass  lesion. Adrenals/Urinary Tract: No adrenal nodule or mass. Kidneys unremarkable. No evidence for hydroureter. The urinary bladder appears normal for the degree of distention. Stomach/Bowel: Stomach is unremarkable. No gastric wall thickening. No evidence of outlet obstruction. Duodenum is normally positioned as is the ligament of Treitz. No small bowel wall thickening. No small bowel dilatation. The terminal ileum is normal. The appendix is not well visualized, but there is no edema or inflammation in the region of the cecum. No gross colonic mass. No colonic wall thickening. Vascular/Lymphatic: There is mild atherosclerotic calcification of the abdominal aorta without aneurysm. There is no gastrohepatic or hepatoduodenal ligament  lymphadenopathy. No retroperitoneal or mesenteric lymphadenopathy. No pelvic sidewall lymphadenopathy. Reproductive: The prostate gland and seminal vesicles are unremarkable. Other: No intraperitoneal free flui

## 2022-03-13 NOTE — ED Notes (Signed)
Informed RN bed assigned 

## 2022-03-13 NOTE — ED Notes (Signed)
Report messaged to [place] rn floor nurse ?

## 2022-03-13 NOTE — Progress Notes (Signed)
Pharmacy Antibiotic Note ? ?Douglas Peterson. is a 70 y.o. male w/ PMH of EtOH abuseHTN, GERD, BPH admitted on 03/13/2022 with pneumonia.  Pharmacy has been consulted for cefepime dosing. ? ?Plan: start cefepime 2 grams IV every 8 hours ?--follow renal function for needed dose adjustments ? ? ?Height: 5' 3"  (160 cm) ?Weight: 64.6 kg (142 lb 6.7 oz) ?IBW/kg (Calculated) : 56.9 ? ?Temp (24hrs), Avg:98.1 ?F (36.7 ?C), Min:98 ?F (36.7 ?C), Max:98.1 ?F (36.7 ?C) ? ?Recent Labs  ?Lab 03/13/22 ?1221 03/13/22 ?1415  ?WBC 11.4*  --   ?CREATININE 0.81  --   ?LATICACIDVEN 3.9* 3.5*  ?  ?Estimated Creatinine Clearance: 68.3 mL/min (by C-G formula based on SCr of 0.81 mg/dL).   ? ?Allergies  ?Allergen Reactions  ? Hydrochlorothiazide Other (See Comments)  ?  Hyponatremia  ? ? ?Antimicrobials this admission: ?05/11 ceftriaxone x 1 ?05/11 azithromycin x 1 ?05/11 cefepime >>  ? ?Microbiology results: ?05/11 MRSA PCR: pending ? ?Thank you for allowing pharmacy to be a part of this patient?s care. ? ?Douglas Peterson ?03/13/2022 7:58 PM ? ?

## 2022-03-13 NOTE — ED Notes (Signed)
Pt eating dinner meal  ?

## 2022-03-13 NOTE — ED Triage Notes (Addendum)
EMS brought patient in from home for fall. Patient c/o bilateral hip pain, dry mouth and sore throat. Reports he feels weak. EMS vitals 74pulse, 95% RA, 11/73 b/p, 77 blood sugar. EMS stated "he is intoxicated"  ?
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
## 2022-03-13 NOTE — ED Notes (Addendum)
Reports he was unable to get out of bed this AM due to weakness. Obviously intoxicated. Had 3 beers today; usually drinks 24 per day. Denies falling, no obvious injuries. Declines wearing oxygen at home, yet noted to be hypoxic at 88% on RA at time of triage. Placed on oxygen by triage RN. Recently d/c from rehab facility. ?

## 2022-03-14 DIAGNOSIS — J189 Pneumonia, unspecified organism: Secondary | ICD-10-CM | POA: Diagnosis not present

## 2022-03-14 LAB — CBC WITH DIFFERENTIAL/PLATELET
Abs Immature Granulocytes: 0.01 10*3/uL (ref 0.00–0.07)
Basophils Absolute: 0 10*3/uL (ref 0.0–0.1)
Basophils Relative: 1 %
Eosinophils Absolute: 0.1 10*3/uL (ref 0.0–0.5)
Eosinophils Relative: 2 %
HCT: 36.8 % — ABNORMAL LOW (ref 39.0–52.0)
Hemoglobin: 12.6 g/dL — ABNORMAL LOW (ref 13.0–17.0)
Immature Granulocytes: 0 %
Lymphocytes Relative: 43 %
Lymphs Abs: 2.3 10*3/uL (ref 0.7–4.0)
MCH: 31.3 pg (ref 26.0–34.0)
MCHC: 34.2 g/dL (ref 30.0–36.0)
MCV: 91.5 fL (ref 80.0–100.0)
Monocytes Absolute: 0.6 10*3/uL (ref 0.1–1.0)
Monocytes Relative: 11 %
Neutro Abs: 2.3 10*3/uL (ref 1.7–7.7)
Neutrophils Relative %: 43 %
Platelets: 141 10*3/uL — ABNORMAL LOW (ref 150–400)
RBC: 4.02 MIL/uL — ABNORMAL LOW (ref 4.22–5.81)
RDW: 12.6 % (ref 11.5–15.5)
WBC: 5.3 10*3/uL (ref 4.0–10.5)
nRBC: 0 % (ref 0.0–0.2)

## 2022-03-14 LAB — COMPREHENSIVE METABOLIC PANEL
ALT: 41 U/L (ref 0–44)
AST: 95 U/L — ABNORMAL HIGH (ref 15–41)
Albumin: 3.6 g/dL (ref 3.5–5.0)
Alkaline Phosphatase: 68 U/L (ref 38–126)
Anion gap: 8 (ref 5–15)
BUN: 12 mg/dL (ref 8–23)
CO2: 26 mmol/L (ref 22–32)
Calcium: 8.4 mg/dL — ABNORMAL LOW (ref 8.9–10.3)
Chloride: 102 mmol/L (ref 98–111)
Creatinine, Ser: 0.51 mg/dL — ABNORMAL LOW (ref 0.61–1.24)
GFR, Estimated: >60 mL/min (ref >60)
Glucose, Bld: 96 mg/dL (ref 70–99)
Potassium: 3.6 mmol/L (ref 3.5–5.1)
Sodium: 136 mmol/L (ref 135–145)
Total Bilirubin: 1.1 mg/dL (ref 0.3–1.2)
Total Protein: 6.3 g/dL — ABNORMAL LOW (ref 6.5–8.1)

## 2022-03-14 LAB — MAGNESIUM: Magnesium: 2.1 mg/dL (ref 1.7–2.4)

## 2022-03-14 LAB — PHOSPHORUS: Phosphorus: 2.9 mg/dL (ref 2.5–4.6)

## 2022-03-14 LAB — LACTIC ACID, PLASMA: Lactic Acid, Venous: 1.3 mmol/L (ref 0.5–1.9)

## 2022-03-14 MED ORDER — BACLOFEN 10 MG PO TABS
10.0000 mg | ORAL_TABLET | Freq: Three times a day (TID) | ORAL | Status: DC
  Administered 2022-03-14 – 2022-03-15 (×3): 10 mg via ORAL
  Filled 2022-03-14 (×3): qty 1

## 2022-03-14 NOTE — TOC Initial Note (Addendum)
Transition of Care (TOC) - Initial/Assessment Note  ? ? ?Patient Details  ?Name: Douglas Peterson. ?MRN: [id number] ?Date of Birth: Mar 08, 1951 ? ?Transition of Care (TOC) CM/SW Contact:    ? E , LCSW ?Phone Number: ?03/14/2022, 12:50 PM ? ?Clinical Narrative:                Spoke with patient regarding DC planning. ?Patient lives alone. Patient says his house key is at home. Offered to call family. Patient says he will call his sister today about the key.  ?Patient has no PCP and is agreeable to being set up with one. CSW called [facility] and made appt for Tuesday 5/16 at 1:00pm - patient will need to bring insurance cards, medications, and ID. They requested TOC fax DC Summary when DC to [phone]. Updated handoff and AVS patient instructions.  ?Pharmacy is [facility].  ?Patient has a cane, RW, w/c, and tub bench at home.  ?Patient said he recently hired a private pay aide for 2 hours per day who will take him to appointments.  ?[name] agreeable to HHPT and OT. Referral made to [facility] with [facility].  ? ? ?Expected Discharge Plan: [place] ?Barriers to Discharge: Continued Medical Work up ? ? ?Patient Goals and CMS Choice ?Patient states their goals for this hospitalization and ongoing recovery are:: home with home health ?CMS Medicare.gov Compare Post Acute Care list provided to:: Patient ?Choice offered to / list presented to : Patient ? ?Expected Discharge Plan and Services ?Expected Discharge Plan: [place] ?  ?  ?  ?Living arrangements for the past 2 months: [place] ?                ?  ?  ?  ?  ?  ?HH Arranged: PT, OT ?[place] Agency: [place] ?Date HH Agency Contacted: 03/14/22 ?  ?Representative spoke with at [place]: Malachy Mood ? ?Prior Living Arrangements/Services ?Living arrangements for the past 2 months: [place] ?Lives with:: Self ?Patient language and need for interpreter reviewed:: Yes ?Do you  feel safe going back to the place where you live?: Yes      ?Need for Family Participation in Patient Care: Yes (Comment) ?Care giver support system in place?: Yes (comment) ?Current home services: DME ?Criminal Activity/Legal Involvement Pertinent to Current Situation/Hospitalization: No - Comment as needed ? ?Activities of Daily Living ?Home Assistive Devices/Equipment: Cane (specify quad or straight) ?ADL Screening (condition at time of admission) ?Patient's cognitive ability adequate to safely complete daily activities?: No ?Is the patient deaf or have difficulty hearing?: No ?Does the patient have difficulty seeing, even when wearing glasses/contacts?: No ?Does the patient have difficulty concentrating, remembering, or making decisions?: Yes ?Patient able to express need for assistance with ADLs?: Yes ?Does the patient have difficulty dressing or bathing?: No ?Independently performs ADLs?: Yes (appropriate for developmental age) ?Does the patient have difficulty walking or climbing stairs?: No ?Weakness of Legs: None ?Weakness of Arms/Hands: None ? ?Permission Sought/Granted ?Permission sought to share information with : Customer service manager ?Permission granted to share information with : Yes, Verbal Permission Granted ?   ? Permission granted to share info w AGENCY: HH ?   ?   ? ?Emotional Assessment ?  ?  ?  ?Orientation: : Oriented to Self, Oriented to Place, Oriented to  Time, Oriented to Situation ?Alcohol / Substance Use: Not Applicable ?Psych Involvement: No (comment) ? ?Admission diagnosis:  Weakness [R53.1] ?CAP (community acquired pneumonia) [J18.9] ?Community acquired pneumonia, unspecified laterality [J18.9] ?Sepsis with acute hypoxic respiratory failure without septic shock, due to unspecified organism ([place]) [A41.9, R65.20, J96.01] ?Patient Active Problem List  ? Diagnosis Date Noted  ? CAP (community acquired pneumonia) 03/13/2022  ? Altered mental status, unspecified 02/20/2022  ?  Hypotension 02/19/2022  ? Dysphonia 08/13/2021  ? 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 Paresthesia of right upper extremity 08/13/2021  ? SIRS (systemic inflammatory response syndrome) ([place]) 08/05/2020  ? GERD (gastroesophageal reflux disease) 06/05/2020  ? Alcohol use disorder, severe, dependence ([place]) 05/28/2020  ? Paroxysmal A-fib ([place]) 03/17/2020  ? Severe protein-calorie malnutrition ([place]) 02/25/2020  ? Primary insomnia 02/06/2020  ? Left inguinal hernia 01/31/2020  ? Encounter for competency evaluation   ? Pancytopenia ([name])   ? Alcoholic cirrhosis of liver without ascites ([place])   ? Thrombocytopenia ([place])   ? 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 Alcohol withdrawal delirium, acute, hyperactive ([place]) 09/21/2019  ? Pressure injury of ankle, stage 1 08/15/2019  ? Palliative care by specialist   ? Alcohol withdrawal ([place]) 03/20/2019  ? Closed fracture of right proximal humerus 03/19/2019  ? Vitamin D deficiency 01/11/2019  ? Osteoporosis 12/22/2018  ? Closed nondisplaced fracture of acromial process of right scapula with routine healing 11/15/2018  ? Compression fracture of L2 vertebra with routine healing 11/15/2018  ? Delirium tremens ([place]) 03/08/2018  ? HTN (hypertension) 09/16/2017  ? 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 Steatohepatitis 12/03/2016  ? Abnormal liver enzymes 06/17/2016  ? Hereditary hemochromatosis ([place]) 06/17/2016  ? Anxiety 07/16/2015  ? Benign essential hypertension 05/22/2014  ? H/O traumatic brain injury 05/22/2014  ? 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[place]) 05/22/2014  ? ?PCP:  System, Provider Not In ?Pharmacy:   ?[facility] [place] ?[address] ?[place] [address] ?Phone: [phone] Fax: [phone] ? ? ? ? ?Social Determinants of Health (SDOH) Interventions ?  ? ?Readmission Risk Interventions ? ?  03/14/2022  ? 12:48 PM 08/23/2020  ? 11:52 AM 08/19/2020  ?  1:32 PM  ?Readmission Risk Prevention  Plan  ?Transportation Screening Complete Complete Complete  ?PCP or Specialist Appt within 3-5 Days Complete    ?[place] or Home Care Consult Complete    ?Social Work Consult for [place] Planning/Counseling Complete    ?Palliative Care Screening Not Applicable    ?Medication Review Press photographer) Complete Complete Complete  ?PCP or Specialist appointment within 3-5 days of discharge   Complete  ?[place] or Home Care Consult  Complete   ?SW Recovery Care/Counseling Consult   Complete  ?Palliative Care Screening   Not Applicable  ?[place]  Not Applicable   ? ? ? ?

## 2022-03-14 NOTE — Plan of Care (Signed)

## 2022-03-14 NOTE — Plan of Care (Signed)
?  Problem: Clinical Measurements: ?Goal: Respiratory complications will improve ?Outcome: Progressing ?  ?Problem: Activity: ?Goal: Risk for activity intolerance will decrease ?Outcome: Progressing ?  ?Problem: Nutrition: ?Goal: Adequate nutrition will be maintained ?Outcome: Progressing ?  ?Problem: Coping: ?Goal: Level of anxiety will decrease ?Outcome: Progressing ?  ?

## 2022-03-14 NOTE — Evaluation (Signed)
Physical Therapy Evaluation ?Patient Details ?Name: Douglas Peterson. ?MRN: [id number] ?DOB: [birth year] ?Today's Date: 03/14/2022 ? ?History of Present Illness ? Douglas Peterson. is a 17yoM who comes to [facility] on 03/13/22 after fall at home with bilat hip pain, dry mouth, sore throat. Pt admitted with CAP. PMH: ETOH abuse with frequent aqdmissions in 2022 & 2021, AF, HTN, remote brain injury with chronic spastic hemiplegia. Pt has a legal guardian. Pt here in April adn DC to [facility] for STR.  ?Clinical Impression ? Pt admitted with above diagnosis. Pt currently with functional limitations due to the deficits listed below (see "PT Problem List"). Pt familiar to author from prior admissions. Upon entry, pt in bed, awake and agreeable to participate. No physical assist required for mobility in session. Pt dons shoes without assist. Pt able to AMB around unit 1x, paces self well given acute weakness. HR/O2 WNL. The pt is alert, pleasant, interactive, and able to provide info regarding prior level of function, both in tolerance and independence. Patient's performance this date reveals decreased ability, independence, and tolerance in performing all basic mobility required for performance of activities of daily living. Pt requires additional DME, close physical assistance, and cues for safe participate in mobility. Pt will benefit from skilled PT intervention to increase independence and safety with basic mobility in preparation for discharge to the venue listed below.   ?   ? ?Recommendations for follow up therapy are one component of a multi-disciplinary discharge planning process, led by the attending physician.  Recommendations may be updated based on patient status, additional functional criteria and insurance authorization. ? ?Follow Up Recommendations Home health PT ? ?  ?Assistance Recommended at Discharge Set up Supervision/Assistance  ?Patient can return home with the following ? A lot of help with walking  and/or transfers;A lot of help with bathing/dressing/bathroom;Assist for transportation;Help with stairs or ramp for entrance;Direct supervision/assist for medications management ? ?  ?Equipment Recommendations None recommended by PT  ?Recommendations for Other Services ?    ?  ?Functional Status Assessment Patient has had a recent decline in their functional status and demonstrates the ability to make significant improvements in function in a reasonable and predictable amount of time.  ? ?  ?Precautions / Restrictions Precautions ?Precautions: Fall ?Restrictions ?Weight Bearing Restrictions: No  ? ?  ? ?Mobility ? Bed Mobility ?Overal bed mobility: Needs Assistance ?Bed Mobility: Supine to Sit, Sit to Supine ?  ?  ?Supine to sit: Supervision ?Sit to supine: Supervision ?  ?General bed mobility comments: labored, but just mildly off baseline ?  ? ?Transfers ?Overall transfer level: Needs assistance ?Equipment used: Straight cane ?Transfers: Sit to/from Stand ?Sit to Stand: Min guard ?  ?  ?  ?  ?  ?General transfer comment: very labored, almost unable to get up ?  ? ?Ambulation/Gait ?Ambulation/Gait assistance: Min guard, Supervision ?Gait Distance (Feet): 240 Feet ?Assistive device: Straight cane ?Gait Pattern/deviations: WFL(Within Functional Limits) ?  ?  ?  ?General Gait Details: moving fairly well today, close to baseline; HR/O2 WNL, pt feels weak and off by ~30% ? ?Stairs ?  ?  ?  ?  ?  ? ?Wheelchair Mobility ?  ? ?Modified Rankin (Stroke Patients Only) ?  ? ?  ? ?Balance   ?  ?  ?  ?  ?  ?  ?  ?  ?  ?  ?  ?  ?  ?  ?  ?  ?  ?  ?   ? ? ? ?  Pertinent Vitals/Pain Pain Assessment ?Pain Assessment: No/denies pain  ? ? ?Home Living Family/patient expects to be discharged to:: Private residence ?Living Arrangements: Alone ?Available Help at Discharge: Family;Available PRN/intermittently ?Type of Home: House ?Home Access: Stairs to enter ?Entrance Stairs-Rails: Right;Left ?Entrance Stairs-Number of Steps: 1 ?  ?Home  Layout: One level ?Home Equipment: Kasandra Knudsen - single point;Cane - quad;Shower seat ?   ?  ?Prior Function Prior Level of Function : Independent/Modified Independent;History of Falls (last six months) ?  ?  ?  ?  ?  ?  ?Mobility Comments: SPC for ambulation with history of falls ?ADLs Comments: Mod I with bathing/dressing ?  ? ? ?Hand Dominance  ?   ? ?  ?Extremity/Trunk Assessment  ?   ?  ? ?  ?  ? ?   ?Communication  ? Communication: No difficulties  ?Cognition   ?  ?  ?  ?  ?  ?  ?  ?  ?  ?  ?  ?  ?  ?  ?  ?  ?  ?  ?  ?  ?  ? ?  ?General Comments   ? ?  ?Exercises    ? ?Assessment/Plan  ?  ?PT Assessment Patient needs continued PT services  ?PT Problem List Decreased strength;Decreased range of motion;Decreased activity tolerance;Decreased balance;Decreased mobility;Decreased cognition;Decreased safety awareness ? ?   ?  ?PT Treatment Interventions DME instruction;Gait training;Functional mobility training;Therapeutic activities;Therapeutic exercise;Balance training;Neuromuscular re-education   ? ?PT Goals (Current goals can be found in the Care Plan section)  ?Acute Rehab PT Goals ?Patient Stated Goal: to regain independence ?PT Goal Formulation: With patient ?Time For Goal Achievement: 03/28/22 ?Potential to Achieve Goals: Good ? ?  ?Frequency Min 2X/week ?  ? ? ?Co-evaluation   ?  ?  ?  ?  ? ? ?  ?AM-PAC PT "6 Clicks" Mobility  ?Outcome Measure Help needed turning from your back to your side while in a flat bed without using bedrails?: A Little ?Help needed moving from lying on your back to sitting on the side of a flat bed without using bedrails?: A Little ?Help needed moving to and from a bed to a chair (including a wheelchair)?: A Little ?Help needed standing up from a chair using your arms (e.g., wheelchair or bedside chair)?: A Little ?Help needed to walk in hospital room?: A Little ?Help needed climbing 3-5 steps with a railing? : A Lot ?6 Click Score: 17 ? ?  ?End of Session Equipment Utilized During  Treatment: Gait belt ?Activity Tolerance: Patient tolerated treatment well;Patient limited by fatigue ?Patient left: in chair;with call bell/phone within reach;with chair alarm set ?Nurse Communication: Mobility status ?PT Visit Diagnosis: Unsteadiness on feet (R26.81);History of falling (Z91.81);Muscle weakness (generalized) (M62.81) ?Pain - Right/Left: Right ?  ? ?Time: 1020-1045 ?PT Time Calculation (min) (ACUTE ONLY): 25 min ? ? ?Charges:   PT Evaluation ?$PT Eval Low Complexity: 1 Low ?PT Treatments ?$Therapeutic Exercise: 8-22 mins ?  ?   ?12:03 PM, 03/14/22 ?Etta Grandchild, PT, DPT ?Physical Therapist - [place] ?[facility]  ?[phone] (ASCOM)  ? ? ?, C ?03/14/2022, 11:59 AM ? ?

## 2022-03-14 NOTE — Evaluation (Signed)
Occupational Therapy Evaluation ?Patient Details ?Name: Douglas Peterson. ?MRN: [id number] ?DOB: 06/13/1951 ?Today's Date: 03/14/2022 ? ? ?History of Present Illness Douglas Peterson. is a 34yoM who comes to [facility] on 03/13/22 after fall at home with bilat hip pain, dry mouth, sore throat. Pt admitted with CAP. PMH: ETOH abuse with frequent aqdmissions in 2022 & 2021, AF, HTN, remote brain injury with chronic spastic hemiplegia. Pt has a legal guardian. Pt here in April adn DC to [facility] for STR.  ? ?Clinical Impression ?  ?Pt seen for OT evaluation.  Pt presenting with decline in ADLs and mobility with dx of CAP, recent fall.  Pt acknowledges feeling weak and more unsteady on his feet.  Tolerated use of cane with min guard to amb to bathroom for ADL completion.  Performed hand hygiene standing at sink d/t R hand digits flexed to palm.  Pt states that his tone has increased since this admission as he states that he does not have the muscle relaxer that he usually takes at home.  Pt states that his doctor discontinued his Baclofen about a month ago, but he can't remember his new med he's been taking, but states he doesn't feel like it helps as much.  Pt states he does have a palm protector and splint at home, but also uses a "hand grip" to keep fingers from digging in to palm.  Nursing notified of pt's request for follow up on a muscle relaxer, and also encouraged pt to discuss with the hospitalist.  Pt demonstrated thorough hand hygiene for R hand standing at sink with close supv-min guard.  Pt stabilized self with abdomen up against sink for support.  OT placed rolled wash cloth in R hand palm to protect skin integrity and to promote increased digit extension.  Pt tolerated well.  OT assisted pt back to bed per his request upon completion of OT session.  Pt will benefit from skilled OT in the acute setting to maximize return to PLOF.  Recommending HH at d/c.  ?   ? ?Recommendations for follow up therapy are one  component of a multi-disciplinary discharge planning process, led by the attending physician.  Recommendations may be updated based on patient status, additional functional criteria and insurance authorization.  ? ?Follow Up Recommendations ? Home health OT  ?  ?Assistance Recommended at Discharge Frequent or constant Supervision/Assistance  ?Patient can return home with the following A little help with walking and/or transfers;Assistance with cooking/housework;A little help with bathing/dressing/bathroom;Help with stairs or ramp for entrance ? ?  ?Functional Status Assessment ? Patient has had a recent decline in their functional status and demonstrates the ability to make significant improvements in function in a reasonable and predictable amount of time.  ?Equipment Recommendations ? None recommended by OT  ?  ?Recommendations for Other Services   ? ? ?  ?Precautions / Restrictions Precautions ?Precautions: Fall ?Restrictions ?Weight Bearing Restrictions: No  ? ?  ? ?Mobility Bed Mobility ?Overal bed mobility: Needs Assistance ?Bed Mobility: Sit to Supine ?  ?  ?Supine to sit: Supervision, HOB elevated ?  ?  ?  ?Patient Response: Cooperative ? ?Transfers ?Overall transfer level: Needs assistance ?Equipment used: Straight cane ?Transfers: Sit to/from Stand ?Sit to Stand: Min guard ?  ?  ?  ?  ?  ?General transfer comment: rocking needed for sit to stand from recliner ?  ? ?  ?Balance Overall balance assessment: History of Falls, Needs assistance ?Sitting-balance support: No upper extremity supported,  Feet unsupported ?Sitting balance-Leahy Scale: Fair ?Sitting balance - Comments: able to sit EOB to cross legs, reach to feet to doff shoes ?  ?Standing balance support: Single extremity supported ?Standing balance-Leahy Scale: Fair ?Standing balance comment: uses cane in L hand ?  ?  ?  ?  ?  ?  ?  ?  ?  ?  ?  ?   ? ?ADL either performed or assessed with clinical judgement  ? ?ADL Overall ADL's : Needs  assistance/impaired ?Eating/Feeding: Independent ?  ?Grooming: Wash/dry hands;Supervision/safety;Standing ?Grooming Details (indicate cue type and reason): supv d/t decreased standing balance; pt stabilized with abdomen up against sink countertop for support. ?  ?  ?  ?  ?  ?  ?Lower Body Dressing: Minimal assistance;Sitting/lateral leans ?Lower Body Dressing Details (indicate cue type and reason): able to doff shoes sitting EOB with; would need min guard-min A to hike pants in standing d/t decreased stability ?  ?  ?Toileting- Water quality scientist and Hygiene: Min guard;Sit to/from stand ?  ?  ?  ?Functional mobility during ADLs: Min guard;Cane ?General ADL Comments: increased time for amb from recliner to bathroom.  Pt reports feeling weakness in legs and acknowledges decreased stability.  ? ? ? ?Vision Patient Visual Report: No change from baseline ?   ?   ?   ?  ?   ?  ? ?Pertinent Vitals/Pain Pain Assessment ?Pain Assessment: No/denies pain ?Pain Score: 0-No pain  ? ? ? ?Hand Dominance   ?  ?Extremity/Trunk Assessment Upper Extremity Assessment ?Upper Extremity Assessment: RUE deficits/detail ?RUE Deficits / Details: spastic hemiplegia, w/ flexion pattern in RUE ?RUE Coordination: decreased fine motor;decreased gross motor ?  ?Lower Extremity Assessment ?Lower Extremity Assessment: Defer to PT evaluation ?  ?Cervical / Trunk Assessment ?Cervical / Trunk Assessment: Kyphotic ?  ?Communication Communication ?Communication: No difficulties ?  ?Cognition Arousal/Alertness: Awake/alert ?Behavior During Therapy: [facility] for tasks assessed/performed ?Overall Cognitive Status: Within Functional Limits for tasks assessed ?  ?  ?  ?  ?  ?  ?  ?  ?  ?  ?  ?  ?Following Commands: Follows one step commands consistently ?  ?  ?  ?  ?  ?  ?General Comments    ? ?  ?Exercises Other Exercises ?Other Exercises: Placed rolled towel in R palm to use in place of palm protector (pt has palm protector at home). ?  ?    ? ? ?Home Living  Family/patient expects to be discharged to:: Private residence ?Living Arrangements: Alone ?Available Help at Discharge: Family;Available PRN/intermittently ?Type of Home: House ?Home Access: Stairs to enter ?Entrance Stairs-Number of Steps: 1 ?Entrance Stairs-Rails: Right;Left ?Home Layout: One level ?  ?  ?Bathroom Shower/Tub: Tub/shower unit ?  ?Bathroom Toilet: Standard ?  ?  ?Home Equipment: Kasandra Knudsen - single point;Cane - quad;Shower seat ?  ?  ?  ? ?  ?Prior Functioning/Environment Prior Level of Function : Independent/Modified Independent;History of Falls (last six months) ?  ?  ?  ?  ?  ?  ?Mobility Comments: SPC for ambulation with history of falls ?ADLs Comments: Mod I with bathing/dressing; assist for cleaning.  Pt states he is supposed to be getting 1 hour of assistance daily from [place] beginning this week. ?  ? ?  ?  ?OT Problem List: Decreased strength;Decreased range of motion;Decreased activity tolerance;Impaired balance (sitting and/or standing);Decreased safety awareness;Impaired UE functional use;Impaired tone ?  ?   ?OT Treatment/Interventions: Self-care/ADL training;Manual therapy;Therapeutic exercise;Patient/family education;Balance  training;Therapeutic activities;DME and/or AE instruction;Splinting  ?  ?OT Goals(Current goals can be found in the care plan section) Acute Rehab OT Goals ?Patient Stated Goal: improve strength and balance and return home ?OT Goal Formulation: With patient ?Time For Goal Achievement: 03/27/22 ?Potential to Achieve Goals: Good  ?OT Frequency: Min 2X/week ?  ? ?   ?  ?  ?  ?  ? ?  ?AM-PAC OT "6 Clicks" Daily Activity     ?Outcome Measure Help from another person eating meals?: None ?Help from another person taking care of personal grooming?: A Little ?Help from another person toileting, which includes using toliet, bedpan, or urinal?: A Little ?Help from another person bathing (including washing, rinsing, drying)?: A Little ?Help from another person to put on and taking  off regular upper body clothing?: A Little ?Help from another person to put on and taking off regular lower body clothing?: A Little ?6 Click Score: 19 ?  ?End of Session Equipment Utilized During Treatment: Gait belt;
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
## 2022-03-15 DIAGNOSIS — J189 Pneumonia, unspecified organism: Secondary | ICD-10-CM | POA: Diagnosis not present

## 2022-03-15 LAB — COMPREHENSIVE METABOLIC PANEL
ALT: 45 U/L — ABNORMAL HIGH (ref 0–44)
AST: 85 U/L — ABNORMAL HIGH (ref 15–41)
Albumin: 3.4 g/dL — ABNORMAL LOW (ref 3.5–5.0)
Alkaline Phosphatase: 67 U/L (ref 38–126)
Anion gap: 4 — ABNORMAL LOW (ref 5–15)
BUN: 11 mg/dL (ref 8–23)
CO2: 29 mmol/L (ref 22–32)
Calcium: 8.5 mg/dL — ABNORMAL LOW (ref 8.9–10.3)
Chloride: 106 mmol/L (ref 98–111)
Creatinine, Ser: 0.48 mg/dL — ABNORMAL LOW (ref 0.61–1.24)
GFR, Estimated: >60 mL/min (ref >60)
Glucose, Bld: 98 mg/dL (ref 70–99)
Potassium: 3.7 mmol/L (ref 3.5–5.1)
Sodium: 139 mmol/L (ref 135–145)
Total Bilirubin: 0.9 mg/dL (ref 0.3–1.2)
Total Protein: 6.2 g/dL — ABNORMAL LOW (ref 6.5–8.1)

## 2022-03-15 LAB — CBC
HCT: 37.5 % — ABNORMAL LOW (ref 39.0–52.0)
Hemoglobin: 12.6 g/dL — ABNORMAL LOW (ref 13.0–17.0)
MCH: 31.8 pg (ref 26.0–34.0)
MCHC: 33.6 g/dL (ref 30.0–36.0)
MCV: 94.7 fL (ref 80.0–100.0)
Platelets: 116 10*3/uL — ABNORMAL LOW (ref 150–400)
RBC: 3.96 MIL/uL — ABNORMAL LOW (ref 4.22–5.81)
RDW: 12.6 % (ref 11.5–15.5)
WBC: 4.2 10*3/uL (ref 4.0–10.5)
nRBC: 0 % (ref 0.0–0.2)

## 2022-03-15 MED ORDER — AMOXICILLIN-POT CLAVULANATE 875-125 MG PO TABS: 1.0000 | ORAL_TABLET | Freq: Two times a day (BID) | ORAL | 0 refills | Status: DC

## 2022-03-15 NOTE — Progress Notes (Signed)
[place] documented after patient d/c ?

## 2022-03-15 NOTE — TOC Transition Note (Addendum)
Transition of Care (TOC) - CM/SW Discharge Note ? ? ?Patient Details  ?Name: Douglas Peterson. ?MRN: [id number] ?Date of Birth: 02/06/1951 ? ?Transition of Care (TOC) CM/SW Contact:  ? E , LCSW ?Phone Number: ?03/15/2022, 11:21 AM ? ? ?Clinical Narrative:   Patient to DC home today. ?CSW notified Malachy Mood with [facility].  ?Patient told CSW yesterday he would call his sister about his house key. Per RN patient still saying he doesn't have his house key. CSW left VM for daughter [name] listed in chart. ? ? ?12:00-  ?Patient told RN he will call a locksmith when he gets home.  ?Patient requested cab voucher. Voucher provided for [employer]. ?PCP appointment made yesterday and added to AVS. ? ? ? ?Final next level of care: [place] ?Barriers to Discharge: Barriers Resolved ? ? ?Patient Goals and CMS Choice ?Patient states their goals for this hospitalization and ongoing recovery are:: home with home health ?CMS Medicare.gov Compare Post Acute Care list provided to:: Patient ?Choice offered to / list presented to : Patient ? ?Discharge Placement ?  ?           ?  ?  ?  ?  ? ?Discharge Plan and Services ?  ?  ?           ?  ?  ?  ?  ?  ?HH Arranged: PT, OT ?[place] Agency: [place] ?Date HH Agency Contacted: 03/15/22 ?  ?Representative spoke with at [place]: Malachy Mood ? ?Social Determinants of Health (SDOH) Interventions ?  ? ? ?Readmission Risk Interventions ? ?  03/14/2022  ? 12:48 PM 08/23/2020  ? 11:52 AM 08/19/2020  ?  1:32 PM  ?Readmission Risk Prevention Plan  ?Transportation Screening Complete Complete Complete  ?PCP or Specialist Appt within 3-5 Days Complete    ?[place] or Home Care Consult Complete    ?Social Work Consult for [place] Planning/Counseling Complete    ?Palliative Care Screening Not Applicable    ?Medication Review Press photographer) Complete Complete Complete  ?PCP or Specialist appointment within 3-5 days of discharge   Complete  ?[place] or Home Care Consult   Complete   ?SW Recovery Care/Counseling Consult   Complete  ?Palliative Care Screening   Not Applicable  ?[place]  Not Applicable   ? ? ? ? ? ?

## 2022-03-15 NOTE — Progress Notes (Signed)
Mobility Specialist - Progress Note ? ? 03/15/22 1000  ?Mobility  ?Activity Ambulated with assistance to bathroom;Ambulated with assistance in hallway  ?Level of Assistance Standby assist, set-up cues, supervision of patient - no hands on  ?Assistive Device [place]  ?Distance Ambulated (ft) 520 ft  ?Activity Response Tolerated well  ?$Mobility charge 1 Mobility  ? ? ? ?Pt sitting in recliner upon arrival, utilizing RA. Pt ambulated to bathroom for urinal out prior to ambulation in hallway with minG. No complaints. Pt returned to recliner with alarm set, needs in reach.  ? ? ?Douglas Peterson ?Mobility Specialist ?03/15/22, 10:46 AM ? ? ? ? ?

## 2022-03-15 NOTE — Discharge Summary (Incomplete)
?Physician Discharge Summary ?  ?Patient: Douglas Peterson. MRN: [id number] DOB: [birth year]  ?Admit date:     03/13/2022  ?Discharge date: {dischdate:26783}  ?Discharge Physician: Jennye Boroughs  ? ?PCP: [facility]  ? ?Recommendations at discharge:  ?{Tip this will not be part of the note when signed- Example include specific recommendations for outpatient follow-up, pending tests to follow-up on. ?(Optional):26781} ? *** ? ?Discharge Diagnoses: ?Principal Problem: ?  CAP (community acquired pneumonia) ? ?Resolved Problems: ?  * No resolved hospital problems. * ? ?Hospital Course: ?No notes on file ? ?Assessment and Plan: ?No notes have been filed under this hospital service. ?Service: Hospitalist ? ? ? ? {Tip this will not be part of the note when signed Body mass index is 25.23 kg/m?. ?, ,  (Optional):26781} ? ?{(NOTE) Pain control PDMP Statment (Optional):26782} ?Consultants: *** ?Procedures performed: ***  ?Disposition: {Plan; Disposition:26390} ?Diet recommendation:  ?Discharge Diet Orders (From admission, onward)  ? ?  Start     Ordered  ? 03/15/22 0000  Diet - low sodium heart healthy       ? 03/15/22 1017  ? ?  ?  ? ?  ? ?{Diet_Plan:26776} ?DISCHARGE MEDICATION: ?Allergies as of 03/15/2022   ? ?   Reactions  ? Hydrochlorothiazide Other (See Comments)  ? Hyponatremia  ? ?  ? ?  ?Medication List  ?  ? ?STOP taking these medications   ? ?Clenpiq 10-3.5-12 MG-GM -GM/160ML Soln ?Generic drug: Sod Picosulfate-Mag Ox-Cit Acd ?  ? ?  ? ?TAKE these medications   ? ?acetaminophen 500 MG tablet ?Commonly known as: TYLENOL ?Take 1 tablet (500 mg total) by mouth every 8 (eight) hours as needed for mild pain or fever. ?  ?amoxicillin-clavulanate 875-125 MG tablet ?Commonly known as: Augmentin ?Take 1 tablet by mouth 2 (two) times daily for 4 days. ?  ?aspirin EC 81 MG tablet ?Take 1 tablet (81 mg total) by mouth daily. Swallow whole. ?  ?atenolol 25 MG tablet ?Commonly known as: TENORMIN ?Take 1 tablet (25  mg total) by mouth 2 (two) times daily. ?  ?atorvastatin 10 MG tablet ?Commonly known as: LIPITOR ?Take 1 tablet (10 mg total) by mouth daily. ?  ?baclofen 10 MG tablet ?Commonly known as: LIORESAL ?Take 1 tablet (10 mg total) by mouth 3 (three) times daily. ?  ?Cholecalciferol 125 MCG (5000 UT) Tabs ?Take 1 tablet (5,000 Units total) by mouth daily. ?  ?doxepin 10 MG capsule ?Commonly known as: SINEQUAN ?Take 1 capsule (10 mg total) by mouth at bedtime as needed (sleep). ?  ?feeding supplement Liqd ?Take 237 mLs by mouth 2 (two) times daily between meals. ?  ?folic acid 1 MG tablet ?Commonly known as: FOLVITE ?Take 1 tablet (1 mg total) by mouth daily. ?  ?hydrOXYzine 25 MG tablet ?Commonly known as: ATARAX ?Take 1 tablet (25 mg total) by mouth 3 (three) times daily as needed for anxiety or itching. ?  ?multivitamin with minerals Tabs tablet ?Take 1 tablet by mouth daily. ?  ?omeprazole 10 MG capsule ?Commonly known as: PRILOSEC ?Take 10 mg by mouth daily. ?  ?ondansetron 4 MG tablet ?Commonly known as: ZOFRAN ?Take 4 mg by mouth every 8 (eight) hours as needed. ?  ?Oyster Shell Calcium w/D 500-200 MG-UNIT Tabs ?Take 1 tablet by mouth daily as needed. ?  ?PARoxetine 30 MG tablet ?Commonly known as: PAXIL ?Take 1 tablet (30 mg total) by mouth daily. ?  ?polyethylene glycol powder 17 GM/SCOOP powder ?Commonly known as:  GLYCOLAX/MIRALAX ?Take 17 g by mouth daily. ?  ?senna-docusate 8.6-50 MG tablet ?Commonly known as: Senokot-S ?Take 2 tablets by mouth at bedtime. ?  ?tamsulosin 0.4 MG Caps capsule ?Commonly known as: FLOMAX ?Take 0.4 mg by mouth daily. ?  ?thiamine 100 MG tablet ?Take 1 tablet (100 mg total) by mouth daily. ?  ?tiZANidine 4 MG tablet ?Commonly known as: ZANAFLEX ?Take 8 mg by mouth 2 (two) times daily. ?  ?vitamin C 1000 MG tablet ?Take 1 tablet (1,000 mg total) by mouth daily. ?  ? ?  ? ? Follow-up Information   ? ? [facility] Follow up on 03/18/2022.   ?Why: New patient appointment  scheduled for Tuesday 5/16 at 1:00pm - patient will need to bring insurance cards, all medications in bottles, and ID ?Contact information: ?[address] ?[place] [address] ?[phone] ? ? ?  ?  ? ?  ?  ? ?  ? ?Discharge Exam: ?Danley Danker Weights  ? 03/13/22 1151  ?Weight: 64.6 kg  ? ?*** ? ?Condition at discharge: {DC Condition:26389} ? ?The results of significant diagnostics from this hospitalization (including imaging, microbiology, ancillary and laboratory) are listed below for reference.  ? ?Imaging Studies: ?CT HEAD WO CONTRAST (5MM) ? ?Result Date: 03/13/2022 ?CLINICAL DATA:  Fall, head injury EXAM: CT HEAD WITHOUT CONTRAST CT CERVICAL SPINE WITHOUT CONTRAST TECHNIQUE: Multidetector CT imaging of the head and cervical spine was performed following the standard protocol without intravenous contrast. Multiplanar CT image reconstructions of the cervical spine were also generated. RADIATION DOSE REDUCTION: This exam was performed according to the departmental dose-optimization program which includes automated exposure control, adjustment of the mA and/or kV according to patient size and/or use of iterative reconstruction technique. COMPARISON:  CT head 02/19/2022 FINDINGS: CT HEAD FINDINGS Brain: Mild atrophy.  Mild white matter hypodensity bilaterally. Negative for acute infarct, hemorrhage, mass Vascular: Negative for hyperdense vessel Skull: Negative Sinuses/Orbits: Negative Other: None CT CERVICAL SPINE FINDINGS Alignment: Normal Skull base and vertebrae: Negative for fracture Soft tissues and spinal canal: No soft tissue mass. Mild subcutaneous edema in the left neck possibly due to contusion. Disc levels: Multilevel disc and facet degeneration. Mild foraminal narrowing bilaterally C3-4 Upper chest: Lung apices clear bilaterally Other: None IMPRESSION: 1. No acute intracranial abnormality. Atrophy and mild chronic microvascular ischemia 2. Cervical spondylosis.  Negative for fracture. Electronically Signed    By: Franchot Gallo M.D.   On: 03/13/2022 15:11  ? ?CT HEAD WO CONTRAST (5MM) ? ?Result Date: 02/19/2022 ?CLINICAL DATA:  Mental status change, unknown cause EXAM: CT HEAD WITHOUT CONTRAST TECHNIQUE: Contiguous axial images were obtained from the base of the skull through the vertex without intravenous contrast. RADIATION DOSE REDUCTION: This exam was performed according to the departmental dose-optimization program which includes automated exposure control, adjustment of the mA and/or kV according to patient size and/or use of iterative reconstruction technique. COMPARISON:  CT head February 14, 2022. FINDINGS: Brain: No evidence of acute infarction, hemorrhage, hydrocephalus, extra-axial collection or mass lesion/mass effect. Similar cerebral atrophy with ex vacuo ventricular dilation. Vascular: No hyperdense vessel identified. Calcific intracranial atherosclerosis. Skull: No acute fracture Sinuses/Orbits: Clear sinuses.  No acute orbital findings. Other: Trace right mastoid effusion. IMPRESSION: 1. No evidence of acute intracranial abnormality. 2.  Cerebral atrophy (ICD10-G31.9). Electronically Signed   By: Margaretha Sheffield M.D.   On: 02/19/2022 10:54  ? ?CT HEAD WO CONTRAST (5MM) ? ?Result Date: 02/14/2022 ?CLINICAL DATA:  Altered mental status. EXAM: CT HEAD WITHOUT CONTRAST TECHNIQUE: Contiguous  axial images were obtained from the base of the skull through the vertex without intravenous contrast. RADIATION DOSE REDUCTION: This exam was performed according to the departmental dose-optimization program which includes automated exposure control, adjustment of the mA and/or kV according to patient size and/or use of iterative reconstruction technique. COMPARISON:  September 19, 2020 FINDINGS: Brain: There is mild cerebral atrophy with widening of the extra-axial spaces and ventricular dilatation. There are areas of decreased attenuation within the white matter tracts of the supratentorial brain, consistent with  microvascular disease changes. Vascular: No hyperdense vessel or unexpected calcification. Skull: Normal. Negative for fracture or focal lesion. Sinuses/Orbits: No acute finding. Other: None. IMPRESSION: 1

## 2022-03-15 NOTE — Progress Notes (Signed)
1207 ?Dc instructions reviewed with pt. All questions and concerns. Follow up appt and medications reviewed. IV removed. Pt bathed and assisted with dressing.. ? ?Pt states he needs a taxi to d/c home and has EMS looking for his house keys, nurse asked if EMS doesn't find house keys, pt states he has a locksmith he will call and he can get in his house.   ?

## 2022-03-16 NOTE — Discharge Summary (Signed)
?Physician Discharge Summary ?  ?Patient: Douglas Peterson. MRN: [id number] DOB: 01/17/1951  ?Admit date:     03/13/2022  ?Discharge date: 03/15/2022  ?Discharge Physician: Jennye Boroughs  ? ?PCP: [facility]  ? ?Recommendations at discharge:  ? ?Follow-up with PCP in 1 week ? ?Discharge Diagnoses: ?Principal Problem: ?  CAP (community acquired pneumonia) ? ?Resolved Problems: ?  * No resolved hospital problems. * ? ?Hospital Course: ? ?Emmauel Hallums. is a 71 y.o. male with medical history significant for recent admission for altered mental status with history of alcohol use, essential hypertension, GERD, BPH, chronic anxiety/depression, who presented to [facility] ED due to generalized weakness, fall and productive cough of a few days duration ? ?He was admitted to the hospital for community-acquired pneumonia.  He was treated with empiric IV antibiotics and IV fluids.  His condition has improved and he is deemed stable for discharge to home.  PT recommended home health therapy at discharge. ? ? ? ?  ? ? ?Consultants: None ?Procedures performed: None ?Disposition: Home ?Diet recommendation:  ?Discharge Diet Orders (From admission, onward)  ? ?  Start     Ordered  ? 03/15/22 0000  Diet - low sodium heart healthy       ? 03/15/22 1017  ? ?  ?  ? ?  ? ?Cardiac diet ?DISCHARGE MEDICATION: ?Allergies as of 03/15/2022   ? ?   Reactions  ? Hydrochlorothiazide Other (See Comments)  ? Hyponatremia  ? ?  ? ?  ?Medication List  ?  ? ?STOP taking these medications   ? ?Clenpiq 10-3.5-12 MG-GM -GM/160ML Soln ?Generic drug: Sod Picosulfate-Mag Ox-Cit Acd ?  ? ?  ? ?TAKE these medications   ? ?acetaminophen 500 MG tablet ?Commonly known as: TYLENOL ?Take 1 tablet (500 mg total) by mouth every 8 (eight) hours as needed for mild pain or fever. ?  ?amoxicillin-clavulanate 875-125 MG tablet ?Commonly known as: Augmentin ?Take 1 tablet by mouth 2 (two) times daily for 4 days. ?  ?aspirin EC 81 MG tablet ?Take 1 tablet (81 mg  total) by mouth daily. Swallow whole. ?  ?atenolol 25 MG tablet ?Commonly known as: TENORMIN ?Take 1 tablet (25 mg total) by mouth 2 (two) times daily. ?  ?atorvastatin 10 MG tablet ?Commonly known as: LIPITOR ?Take 1 tablet (10 mg total) by mouth daily. ?  ?baclofen 10 MG tablet ?Commonly known as: LIORESAL ?Take 1 tablet (10 mg total) by mouth 3 (three) times daily. ?  ?Cholecalciferol 125 MCG (5000 UT) Tabs ?Take 1 tablet (5,000 Units total) by mouth daily. ?Notes to patient: Not given in hospital ?  ?doxepin 10 MG capsule ?Commonly known as: SINEQUAN ?Take 1 capsule (10 mg total) by mouth at bedtime as needed (sleep). ?Notes to patient: Not given in hospital ?  ?feeding supplement Liqd ?Take 237 mLs by mouth 2 (two) times daily between meals. ?Notes to patient: Not given in hospital ?  ?folic acid 1 MG tablet ?Commonly known as: FOLVITE ?Take 1 tablet (1 mg total) by mouth daily. ?  ?hydrOXYzine 25 MG tablet ?Commonly known as: ATARAX ?Take 1 tablet (25 mg total) by mouth 3 (three) times daily as needed for anxiety or itching. ?  ?multivitamin with minerals Tabs tablet ?Take 1 tablet by mouth daily. ?Notes to patient: Not given in hospital ?  ?omeprazole 10 MG capsule ?Commonly known as: PRILOSEC ?Take 10 mg by mouth daily. ?Notes to patient: Not given in hospital ?  ?ondansetron 4 MG  tablet ?Commonly known as: ZOFRAN ?Take 4 mg by mouth every 8 (eight) hours as needed. ?  ?Oyster Shell Calcium w/D 500-200 MG-UNIT Tabs ?Take 1 tablet by mouth daily as needed. ?Notes to patient: Not given in hospital ?  ?PARoxetine 30 MG tablet ?Commonly known as: PAXIL ?Take 1 tablet (30 mg total) by mouth daily. ?Notes to patient: Not given in hospital ?  ?polyethylene glycol powder 17 GM/SCOOP powder ?Commonly known as: GLYCOLAX/MIRALAX ?Take 17 g by mouth daily. ?  ?senna-docusate 8.6-50 MG tablet ?Commonly known as: Senokot-S ?Take 2 tablets by mouth at bedtime. ?Notes to patient: Not given in hospital ?  ?tamsulosin 0.4 MG  Caps capsule ?Commonly known as: FLOMAX ?Take 0.4 mg by mouth daily. ?  ?thiamine 100 MG tablet ?Take 1 tablet (100 mg total) by mouth daily. ?  ?tiZANidine 4 MG tablet ?Commonly known as: ZANAFLEX ?Take 8 mg by mouth 2 (two) times daily. ?Notes to patient: Not given in hospital ?  ?vitamin C 1000 MG tablet ?Take 1 tablet (1,000 mg total) by mouth daily. ?Notes to patient: Not given in hospital ?  ? ?  ? ? Follow-up Information   ? ? [facility] Follow up on 03/18/2022.   ?Why: New patient appointment scheduled for Tuesday 5/16 at 1:00pm - patient will need to bring insurance cards, all medications in bottles, and ID ?Contact information: ?[address] ?[place] [address] ?[phone] ? ? ?  ?  ? ?  ?  ? ?  ? ?Discharge Exam: ?Danley Danker Weights  ? 03/13/22 1151  ?Weight: 64.6 kg  ? ?GEN: NAD ?SKIN: No rash ?EYES: EOMI ?ENT: MMM ?CV: RRR ?PULM: CTA B ?ABD: soft, ND, NT, +BS ?CNS: AAO x 3, chronic right upper extremity weakness with contracture ?EXT: No edema or tenderness ? ? ?Condition at discharge: good ? ?The results of significant diagnostics from this hospitalization (including imaging, microbiology, ancillary and laboratory) are listed below for reference.  ? ?Imaging Studies: ?CT HEAD WO CONTRAST (5MM) ? ?Result Date: 03/13/2022 ?CLINICAL DATA:  Fall, head injury EXAM: CT HEAD WITHOUT CONTRAST CT CERVICAL SPINE WITHOUT CONTRAST TECHNIQUE: Multidetector CT imaging of the head and cervical spine was performed following the standard protocol without intravenous contrast. Multiplanar CT image reconstructions of the cervical spine were also generated. RADIATION DOSE REDUCTION: This exam was performed according to the departmental dose-optimization program which includes automated exposure control, adjustment of the mA and/or kV according to patient size and/or use of iterative reconstruction technique. COMPARISON:  CT head 02/19/2022 FINDINGS: CT HEAD FINDINGS Brain: Mild atrophy.  Mild white  matter hypodensity bilaterally. Negative for acute infarct, hemorrhage, mass Vascular: Negative for hyperdense vessel Skull: Negative Sinuses/Orbits: Negative Other: None CT CERVICAL SPINE FINDINGS Alignment: Normal Skull base and vertebrae: Negative for fracture Soft tissues and spinal canal: No soft tissue mass. Mild subcutaneous edema in the left neck possibly due to contusion. Disc levels: Multilevel disc and facet degeneration. Mild foraminal narrowing bilaterally C3-4 Upper chest: Lung apices clear bilaterally Other: None IMPRESSION: 1. No acute intracranial abnormality. Atrophy and mild chronic microvascular ischemia 2. Cervical spondylosis.  Negative for fracture. Electronically Signed   By: Franchot Gallo M.D.   On: 03/13/2022 15:11  ? ?CT HEAD WO CONTRAST (5MM) ? ?Result Date: 02/19/2022 ?CLINICAL DATA:  Mental status change, unknown cause EXAM: CT HEAD WITHOUT CONTRAST TECHNIQUE: Contiguous axial images were obtained from the base of the skull through the vertex without intravenous contrast. RADIATION DOSE REDUCTION: This exam was performed according  to the departmental dose-optimization program which includes automated exposure control, adjustment of the mA and/or kV according to patient size and/or use of iterative reconstruction technique. COMPARISON:  CT head February 14, 2022. FINDINGS: Brain: No evidence of acute infarction, hemorrhage, hydrocephalus, extra-axial collection or mass lesion/mass effect. Similar cerebral atrophy with ex vacuo ventricular dilation. Vascular: No hyperdense vessel identified. Calcific intracranial atherosclerosis. Skull: No acute fracture Sinuses/Orbits: Clear sinuses.  No acute orbital findings. Other: Trace right mastoid effusion. IMPRESSION: 1. No evidence of acute intracranial abnormality. 2.  Cerebral atrophy (ICD10-G31.9). Electronically Signed   By: Margaretha Sheffield M.D.   On: 02/19/2022 10:54  ? ?CT Angio Chest PE W and/or Wo Contrast ? ?Result Date:  03/13/2022 ?CLINICAL DATA:  Unwitnessed fall. EXAM: CT ANGIOGRAPHY CHEST CT ABDOMEN AND PELVIS WITH CONTRAST TECHNIQUE: Multidetector CT imaging of the chest was performed using the standard protocol during bolus administr

## 2022-03-17 ENCOUNTER — Inpatient Hospital Stay
Admission: EM | Admit: 2022-03-17 | Discharge: 2022-04-10 | DRG: 562 | Disposition: A | Discharge status: Home Health | Payer: Medicare HMO | Attending: Family Medicine | Admitting: Family Medicine

## 2022-03-17 ENCOUNTER — Other Ambulatory Visit: Payer: Self-pay

## 2022-03-17 ENCOUNTER — Emergency Department: Payer: Medicare HMO

## 2022-03-17 ENCOUNTER — Encounter: Payer: Self-pay | Admitting: Emergency Medicine

## 2022-03-17 DIAGNOSIS — Y92009 Unspecified place in unspecified non-institutional (private) residence as the place of occurrence of the external cause: Secondary | ICD-10-CM

## 2022-03-17 DIAGNOSIS — Z7901 Long term (current) use of anticoagulants: Secondary | ICD-10-CM

## 2022-03-17 DIAGNOSIS — D6959 Other secondary thrombocytopenia: Secondary | ICD-10-CM | POA: Diagnosis present

## 2022-03-17 DIAGNOSIS — R531 Weakness: Secondary | ICD-10-CM

## 2022-03-17 DIAGNOSIS — S42025A Nondisplaced fracture of shaft of left clavicle, initial encounter for closed fracture: Principal | ICD-10-CM

## 2022-03-17 DIAGNOSIS — Z79899 Other long term (current) drug therapy: Secondary | ICD-10-CM

## 2022-03-17 DIAGNOSIS — Z87820 Personal history of traumatic brain injury: Secondary | ICD-10-CM

## 2022-03-17 DIAGNOSIS — Z8249 Family history of ischemic heart disease and other diseases of the circulatory system: Secondary | ICD-10-CM

## 2022-03-17 DIAGNOSIS — E43 Unspecified severe protein-calorie malnutrition: Secondary | ICD-10-CM | POA: Diagnosis present

## 2022-03-17 DIAGNOSIS — Z801 Family history of malignant neoplasm of trachea, bronchus and lung: Secondary | ICD-10-CM

## 2022-03-17 DIAGNOSIS — F32A Depression, unspecified: Secondary | ICD-10-CM | POA: Diagnosis present

## 2022-03-17 DIAGNOSIS — Z87891 Personal history of nicotine dependence: Secondary | ICD-10-CM

## 2022-03-17 DIAGNOSIS — W19XXXA Unspecified fall, initial encounter: Secondary | ICD-10-CM | POA: Diagnosis not present

## 2022-03-17 DIAGNOSIS — F1020 Alcohol dependence, uncomplicated: Secondary | ICD-10-CM | POA: Diagnosis present

## 2022-03-17 DIAGNOSIS — Z888 Allergy status to other drugs, medicaments and biological substances status: Secondary | ICD-10-CM

## 2022-03-17 DIAGNOSIS — F10288 Alcohol dependence with other alcohol-induced disorder: Secondary | ICD-10-CM | POA: Diagnosis not present

## 2022-03-17 DIAGNOSIS — M2459 Contracture, other specified joint: Secondary | ICD-10-CM | POA: Diagnosis present

## 2022-03-17 DIAGNOSIS — E785 Hyperlipidemia, unspecified: Secondary | ICD-10-CM | POA: Diagnosis present

## 2022-03-17 DIAGNOSIS — S42009A Fracture of unspecified part of unspecified clavicle, initial encounter for closed fracture: Secondary | ICD-10-CM | POA: Diagnosis present

## 2022-03-17 DIAGNOSIS — J189 Pneumonia, unspecified organism: Secondary | ICD-10-CM | POA: Diagnosis present

## 2022-03-17 DIAGNOSIS — F419 Anxiety disorder, unspecified: Secondary | ICD-10-CM | POA: Diagnosis present

## 2022-03-17 DIAGNOSIS — K219 Gastro-esophageal reflux disease without esophagitis: Secondary | ICD-10-CM | POA: Diagnosis present

## 2022-03-17 DIAGNOSIS — S4982XA Other specified injuries of left shoulder and upper arm, initial encounter: Secondary | ICD-10-CM | POA: Diagnosis not present

## 2022-03-17 DIAGNOSIS — N400 Enlarged prostate without lower urinary tract symptoms: Secondary | ICD-10-CM

## 2022-03-17 DIAGNOSIS — E872 Acidosis, unspecified: Secondary | ICD-10-CM | POA: Diagnosis present

## 2022-03-17 DIAGNOSIS — I10 Essential (primary) hypertension: Secondary | ICD-10-CM | POA: Diagnosis present

## 2022-03-17 DIAGNOSIS — M2450 Contracture, unspecified joint: Secondary | ICD-10-CM | POA: Diagnosis present

## 2022-03-17 DIAGNOSIS — Y906 Blood alcohol level of 120-199 mg/100 ml: Secondary | ICD-10-CM | POA: Diagnosis present

## 2022-03-17 DIAGNOSIS — F10220 Alcohol dependence with intoxication, uncomplicated: Secondary | ICD-10-CM | POA: Diagnosis present

## 2022-03-17 DIAGNOSIS — I480 Paroxysmal atrial fibrillation: Secondary | ICD-10-CM | POA: Diagnosis present

## 2022-03-17 DIAGNOSIS — Z6822 Body mass index (BMI) 22.0-22.9, adult: Secondary | ICD-10-CM

## 2022-03-17 DIAGNOSIS — Y92013 Bedroom of single-family (private) house as the place of occurrence of the external cause: Secondary | ICD-10-CM

## 2022-03-17 DIAGNOSIS — Z7982 Long term (current) use of aspirin: Secondary | ICD-10-CM

## 2022-03-17 LAB — URINALYSIS, ROUTINE W REFLEX MICROSCOPIC
Bacteria, UA: NONE SEEN
Bilirubin Urine: NEGATIVE
Glucose, UA: NEGATIVE mg/dL
Ketones, ur: 5 mg/dL — AB
Leukocytes,Ua: NEGATIVE
Nitrite: NEGATIVE
Protein, ur: NEGATIVE mg/dL
Specific Gravity, Urine: 1.005 (ref 1.005–1.030)
Squamous Epithelial / HPF: NONE SEEN (ref 0–5)
pH: 6 (ref 5.0–8.0)

## 2022-03-17 LAB — COMPREHENSIVE METABOLIC PANEL
ALT: 63 U/L — ABNORMAL HIGH (ref 0–44)
AST: 72 U/L — ABNORMAL HIGH (ref 15–41)
Albumin: 4.9 g/dL (ref 3.5–5.0)
Alkaline Phosphatase: 87 U/L (ref 38–126)
Anion gap: 17 — ABNORMAL HIGH (ref 5–15)
BUN: <5 mg/dL — ABNORMAL LOW (ref 8–23)
CO2: 25 mmol/L (ref 22–32)
Calcium: 9.5 mg/dL (ref 8.9–10.3)
Chloride: 93 mmol/L — ABNORMAL LOW (ref 98–111)
Creatinine, Ser: 0.43 mg/dL — ABNORMAL LOW (ref 0.61–1.24)
GFR, Estimated: >60 mL/min (ref >60)
Glucose, Bld: 71 mg/dL (ref 70–99)
Potassium: 3.6 mmol/L (ref 3.5–5.1)
Sodium: 135 mmol/L (ref 135–145)
Total Bilirubin: 1.1 mg/dL (ref 0.3–1.2)
Total Protein: 8.6 g/dL — ABNORMAL HIGH (ref 6.5–8.1)

## 2022-03-17 LAB — CBC WITH DIFFERENTIAL/PLATELET
Abs Immature Granulocytes: 0.06 10*3/uL (ref 0.00–0.07)
Basophils Absolute: 0.1 10*3/uL (ref 0.0–0.1)
Basophils Relative: 1 %
Eosinophils Absolute: 0.1 10*3/uL (ref 0.0–0.5)
Eosinophils Relative: 1 %
HCT: 45.9 % (ref 39.0–52.0)
Hemoglobin: 15.9 g/dL (ref 13.0–17.0)
Immature Granulocytes: 0 %
Lymphocytes Relative: 30 %
Lymphs Abs: 4.4 10*3/uL — ABNORMAL HIGH (ref 0.7–4.0)
MCH: 31.2 pg (ref 26.0–34.0)
MCHC: 34.6 g/dL (ref 30.0–36.0)
MCV: 90 fL (ref 80.0–100.0)
Monocytes Absolute: 0.6 10*3/uL (ref 0.1–1.0)
Monocytes Relative: 4 %
Neutro Abs: 9.6 10*3/uL — ABNORMAL HIGH (ref 1.7–7.7)
Neutrophils Relative %: 64 %
Platelets: 162 10*3/uL (ref 150–400)
RBC: 5.1 MIL/uL (ref 4.22–5.81)
RDW: 12.4 % (ref 11.5–15.5)
WBC: 14.8 10*3/uL — ABNORMAL HIGH (ref 4.0–10.5)
nRBC: 0 % (ref 0.0–0.2)

## 2022-03-17 LAB — LIPASE, BLOOD: Lipase: 30 U/L (ref 11–51)

## 2022-03-17 LAB — CK: Total CK: 398 U/L — ABNORMAL HIGH (ref 49–397)

## 2022-03-17 LAB — ETHANOL: Alcohol, Ethyl (B): 121 mg/dL — ABNORMAL HIGH (ref <10)

## 2022-03-17 MED ORDER — DOXEPIN HCL 10 MG PO CAPS
10.0000 mg | ORAL_CAPSULE | Freq: Every evening | ORAL | Status: DC | PRN
  Filled 2022-03-17: qty 1

## 2022-03-17 MED ORDER — PAROXETINE HCL 30 MG PO TABS
30.0000 mg | ORAL_TABLET | Freq: Every day | ORAL | Status: DC
  Administered 2022-03-18 – 2022-04-10 (×24): 30 mg via ORAL
  Filled 2022-03-17 (×25): qty 1

## 2022-03-17 MED ORDER — THIAMINE HCL 100 MG PO TABS: 100.0000 mg | ORAL_TABLET | Freq: Every day | ORAL | Status: DC

## 2022-03-17 MED ORDER — SODIUM CHLORIDE 0.9 % IV SOLN
20.0000 mL/h | INTRAVENOUS | Status: AC
  Administered 2022-03-19: 20 mL/h via INTRAVENOUS

## 2022-03-17 MED ORDER — ASPIRIN EC 81 MG PO TBEC
81.0000 mg | DELAYED_RELEASE_TABLET | Freq: Every day | ORAL | Status: DC
  Administered 2022-03-18: 81 mg via ORAL
  Filled 2022-03-17: qty 1

## 2022-03-17 MED ORDER — ATORVASTATIN CALCIUM 10 MG PO TABS
10.0000 mg | ORAL_TABLET | Freq: Every day | ORAL | Status: DC
  Administered 2022-03-18 – 2022-04-10 (×24): 10 mg via ORAL
  Filled 2022-03-17 (×24): qty 1

## 2022-03-17 MED ORDER — HYDRALAZINE HCL 20 MG/ML IJ SOLN: 10.0000 mg | Freq: Four times a day (QID) | INTRAMUSCULAR | Status: DC | PRN

## 2022-03-17 MED ORDER — SODIUM CHLORIDE 0.9% FLUSH
3.0000 mL | Freq: Two times a day (BID) | INTRAVENOUS | Status: DC
  Administered 2022-03-18 – 2022-04-07 (×36): 3 mL via INTRAVENOUS

## 2022-03-17 MED ORDER — ONDANSETRON HCL 4 MG PO TABS
4.0000 mg | ORAL_TABLET | Freq: Three times a day (TID) | ORAL | Status: DC | PRN
  Administered 2022-03-18 (×2): 4 mg via ORAL
  Filled 2022-03-17 (×2): qty 1

## 2022-03-17 MED ORDER — DIAZEPAM 5 MG PO TABS: 5.0000 mg | ORAL_TABLET | Freq: Four times a day (QID) | ORAL | Status: DC | PRN

## 2022-03-17 MED ORDER — ATENOLOL 25 MG PO TABS
25.0000 mg | ORAL_TABLET | Freq: Two times a day (BID) | ORAL | Status: DC
  Administered 2022-03-18 – 2022-04-01 (×29): 25 mg via ORAL
  Filled 2022-03-17 (×30): qty 1

## 2022-03-17 MED ORDER — PANTOPRAZOLE SODIUM 40 MG IV SOLR
40.0000 mg | Freq: Two times a day (BID) | INTRAVENOUS | Status: DC
Start: 2022-03-17 — End: 2022-03-28
  Administered 2022-03-18 – 2022-03-28 (×22): 40 mg via INTRAVENOUS
  Filled 2022-03-17 (×22): qty 10

## 2022-03-17 MED ORDER — MORPHINE SULFATE (PF) 2 MG/ML IV SOLN
2.0000 mg | INTRAVENOUS | Status: DC | PRN
  Administered 2022-03-18 – 2022-03-31 (×25): 2 mg via INTRAVENOUS
  Filled 2022-03-17 (×26): qty 1

## 2022-03-17 MED ORDER — ACETAMINOPHEN 650 MG RE SUPP: 650.0000 mg | Freq: Four times a day (QID) | RECTAL | Status: DC | PRN

## 2022-03-17 MED ORDER — RIVAROXABAN 10 MG PO TABS
10.0000 mg | ORAL_TABLET | Freq: Every day | ORAL | Status: DC
  Administered 2022-03-18 – 2022-03-20 (×3): 10 mg via ORAL
  Filled 2022-03-17 (×3): qty 1

## 2022-03-17 MED ORDER — ACETAMINOPHEN 325 MG PO TABS
650.0000 mg | ORAL_TABLET | Freq: Four times a day (QID) | ORAL | Status: DC | PRN
  Administered 2022-03-18 – 2022-03-19 (×3): 650 mg via ORAL
  Filled 2022-03-17 (×3): qty 2

## 2022-03-17 MED ORDER — DIAZEPAM 5 MG PO TABS
5.0000 mg | ORAL_TABLET | Freq: Four times a day (QID) | ORAL | Status: AC | PRN
  Administered 2022-03-18: 5 mg via ORAL
  Filled 2022-03-17: qty 1

## 2022-03-17 MED ORDER — ONDANSETRON 4 MG PO TBDP
4.0000 mg | ORAL_TABLET | Freq: Once | ORAL | Status: AC
  Administered 2022-03-17: 4 mg via ORAL
  Filled 2022-03-17: qty 1

## 2022-03-17 MED ORDER — OXYCODONE-ACETAMINOPHEN 5-325 MG PO TABS
1.0000 | ORAL_TABLET | Freq: Once | ORAL | Status: AC
  Administered 2022-03-17: 1 via ORAL
  Filled 2022-03-17: qty 1

## 2022-03-17 MED ORDER — HYDROXYZINE HCL 25 MG PO TABS: 25.0000 mg | ORAL_TABLET | Freq: Three times a day (TID) | ORAL | Status: DC | PRN

## 2022-03-17 MED ORDER — TAMSULOSIN HCL 0.4 MG PO CAPS
0.4000 mg | ORAL_CAPSULE | Freq: Every day | ORAL | Status: DC
  Administered 2022-03-18 – 2022-04-10 (×24): 0.4 mg via ORAL
  Filled 2022-03-17 (×24): qty 1

## 2022-03-17 NOTE — Assessment & Plan Note (Addendum)
Continue Xarelto and atenolol. ?
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
## 2022-03-17 NOTE — Assessment & Plan Note (Addendum)
Start Ensure ? ?

## 2022-03-17 NOTE — ED Triage Notes (Signed)
Pt to ED via [facility] from home for fall. Pt is c/o 10/10 left shoulder pain. Pt states that he hit his shoulder on the floor. Pt is currently in NAD.  ?

## 2022-03-17 NOTE — Assessment & Plan Note (Deleted)
Patient called with history of multiple falls trauma presenting today with a fall in his left shoulder incurring left clavicular fracture that is displaced. ?We will admit patient for observation and treatment evaluation with physical therapy and short-term rehab. 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 ?

## 2022-03-17 NOTE — Assessment & Plan Note (Deleted)
Blood pressure (!) 157/104, pulse (!) 115, temperature 98 ?F (36.7 ?C), temperature source Oral, resp. rate (!) 21, height 5' 3"  (1.6 m), weight 64.6 kg, SpO2 94 %. ?As needed hydralazine, continue atenolol. ? ?
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
## 2022-03-17 NOTE — Assessment & Plan Note (Addendum)
Treating with high-dose thiamine while here in the hospital.   ?Continue CIWA protocol. ? ?

## 2022-03-18 DIAGNOSIS — Z7982 Long term (current) use of aspirin: Secondary | ICD-10-CM | POA: Diagnosis not present

## 2022-03-18 DIAGNOSIS — F419 Anxiety disorder, unspecified: Secondary | ICD-10-CM | POA: Diagnosis present

## 2022-03-18 DIAGNOSIS — I480 Paroxysmal atrial fibrillation: Secondary | ICD-10-CM

## 2022-03-18 DIAGNOSIS — R531 Weakness: Secondary | ICD-10-CM

## 2022-03-18 DIAGNOSIS — Y92013 Bedroom of single-family (private) house as the place of occurrence of the external cause: Secondary | ICD-10-CM | POA: Diagnosis not present

## 2022-03-18 DIAGNOSIS — Y906 Blood alcohol level of 120-199 mg/100 ml: Secondary | ICD-10-CM | POA: Diagnosis present

## 2022-03-18 DIAGNOSIS — Z6822 Body mass index (BMI) 22.0-22.9, adult: Secondary | ICD-10-CM | POA: Diagnosis not present

## 2022-03-18 DIAGNOSIS — E785 Hyperlipidemia, unspecified: Secondary | ICD-10-CM | POA: Diagnosis present

## 2022-03-18 DIAGNOSIS — E43 Unspecified severe protein-calorie malnutrition: Secondary | ICD-10-CM

## 2022-03-18 DIAGNOSIS — F10220 Alcohol dependence with intoxication, uncomplicated: Secondary | ICD-10-CM | POA: Diagnosis present

## 2022-03-18 DIAGNOSIS — M2450 Contracture, unspecified joint: Secondary | ICD-10-CM | POA: Diagnosis not present

## 2022-03-18 DIAGNOSIS — F32A Depression, unspecified: Secondary | ICD-10-CM | POA: Diagnosis present

## 2022-03-18 DIAGNOSIS — S4982XA Other specified injuries of left shoulder and upper arm, initial encounter: Secondary | ICD-10-CM | POA: Diagnosis present

## 2022-03-18 DIAGNOSIS — N400 Enlarged prostate without lower urinary tract symptoms: Secondary | ICD-10-CM | POA: Diagnosis present

## 2022-03-18 DIAGNOSIS — I10 Essential (primary) hypertension: Secondary | ICD-10-CM | POA: Diagnosis present

## 2022-03-18 DIAGNOSIS — F1020 Alcohol dependence, uncomplicated: Secondary | ICD-10-CM | POA: Diagnosis not present

## 2022-03-18 DIAGNOSIS — Z801 Family history of malignant neoplasm of trachea, bronchus and lung: Secondary | ICD-10-CM | POA: Diagnosis not present

## 2022-03-18 DIAGNOSIS — Z79899 Other long term (current) drug therapy: Secondary | ICD-10-CM | POA: Diagnosis not present

## 2022-03-18 DIAGNOSIS — J189 Pneumonia, unspecified organism: Secondary | ICD-10-CM | POA: Diagnosis present

## 2022-03-18 DIAGNOSIS — Z8249 Family history of ischemic heart disease and other diseases of the circulatory system: Secondary | ICD-10-CM | POA: Diagnosis not present

## 2022-03-18 DIAGNOSIS — M2459 Contracture, other specified joint: Secondary | ICD-10-CM | POA: Diagnosis present

## 2022-03-18 DIAGNOSIS — S42025A Nondisplaced fracture of shaft of left clavicle, initial encounter for closed fracture: Secondary | ICD-10-CM

## 2022-03-18 DIAGNOSIS — D6959 Other secondary thrombocytopenia: Secondary | ICD-10-CM | POA: Diagnosis present

## 2022-03-18 DIAGNOSIS — Z87891 Personal history of nicotine dependence: Secondary | ICD-10-CM | POA: Diagnosis not present

## 2022-03-18 DIAGNOSIS — K219 Gastro-esophageal reflux disease without esophagitis: Secondary | ICD-10-CM | POA: Diagnosis present

## 2022-03-18 DIAGNOSIS — Z888 Allergy status to other drugs, medicaments and biological substances status: Secondary | ICD-10-CM | POA: Diagnosis not present

## 2022-03-18 DIAGNOSIS — E872 Acidosis, unspecified: Secondary | ICD-10-CM | POA: Diagnosis present

## 2022-03-18 DIAGNOSIS — Z87820 Personal history of traumatic brain injury: Secondary | ICD-10-CM | POA: Diagnosis not present

## 2022-03-18 DIAGNOSIS — Z7901 Long term (current) use of anticoagulants: Secondary | ICD-10-CM | POA: Diagnosis not present

## 2022-03-18 LAB — LACTIC ACID, PLASMA
Lactic Acid, Venous: 1.2 mmol/L (ref 0.5–1.9)
Lactic Acid, Venous: 1.8 mmol/L (ref 0.5–1.9)
Lactic Acid, Venous: 2.2 mmol/L (ref 0.5–1.9)

## 2022-03-18 LAB — CBC
HCT: 42.4 % (ref 39.0–52.0)
Hemoglobin: 14.6 g/dL (ref 13.0–17.0)
MCH: 31.6 pg (ref 26.0–34.0)
MCHC: 34.4 g/dL (ref 30.0–36.0)
MCV: 91.8 fL (ref 80.0–100.0)
Platelets: 132 10*3/uL — ABNORMAL LOW (ref 150–400)
RBC: 4.62 MIL/uL (ref 4.22–5.81)
RDW: 12.6 % (ref 11.5–15.5)
WBC: 9.7 10*3/uL (ref 4.0–10.5)
nRBC: 0 % (ref 0.0–0.2)

## 2022-03-18 LAB — COMPREHENSIVE METABOLIC PANEL
ALT: 50 U/L — ABNORMAL HIGH (ref 0–44)
AST: 51 U/L — ABNORMAL HIGH (ref 15–41)
Albumin: 4 g/dL (ref 3.5–5.0)
Alkaline Phosphatase: 72 U/L (ref 38–126)
Anion gap: 10 (ref 5–15)
BUN: 7 mg/dL — ABNORMAL LOW (ref 8–23)
CO2: 29 mmol/L (ref 22–32)
Calcium: 8.9 mg/dL (ref 8.9–10.3)
Chloride: 96 mmol/L — ABNORMAL LOW (ref 98–111)
Creatinine, Ser: 0.57 mg/dL — ABNORMAL LOW (ref 0.61–1.24)
GFR, Estimated: >60 mL/min (ref >60)
Glucose, Bld: 70 mg/dL (ref 70–99)
Potassium: 4.4 mmol/L (ref 3.5–5.1)
Sodium: 135 mmol/L (ref 135–145)
Total Bilirubin: 1.2 mg/dL (ref 0.3–1.2)
Total Protein: 7.1 g/dL (ref 6.5–8.1)

## 2022-03-18 MED ORDER — ADULT MULTIVITAMIN W/MINERALS CH
1.0000 | ORAL_TABLET | Freq: Every day | ORAL | Status: DC
  Administered 2022-03-18 – 2022-04-10 (×24): 1 via ORAL
  Filled 2022-03-18 (×25): qty 1

## 2022-03-18 MED ORDER — THIAMINE HCL 100 MG/ML IJ SOLN
500.0000 mg | Freq: Three times a day (TID) | INTRAVENOUS | Status: AC
  Administered 2022-03-18 – 2022-03-20 (×5): 500 mg via INTRAVENOUS
  Filled 2022-03-18 (×6): qty 5

## 2022-03-18 MED ORDER — FOLIC ACID 1 MG PO TABS
1.0000 mg | ORAL_TABLET | Freq: Every day | ORAL | Status: DC
  Administered 2022-03-18 – 2022-04-10 (×24): 1 mg via ORAL
  Filled 2022-03-18 (×24): qty 1

## 2022-03-18 MED ORDER — LORAZEPAM 1 MG PO TABS: 1.0000 mg | ORAL_TABLET | ORAL | Status: AC | PRN

## 2022-03-18 MED ORDER — SODIUM CHLORIDE 0.9 % IV BOLUS
250.0000 mL | Freq: Once | INTRAVENOUS | Status: AC
  Administered 2022-03-18: 250 mL via INTRAVENOUS

## 2022-03-18 MED ORDER — LORAZEPAM 2 MG/ML IJ SOLN: 1.0000 mg | INTRAMUSCULAR | Status: AC | PRN

## 2022-03-18 NOTE — Assessment & Plan Note (Signed)
Well-known to our service with falls at home.  PT recommending home with home health. ?

## 2022-03-18 NOTE — TOC Initial Note (Signed)
Transition of Care (TOC) - Initial/Assessment Note  ? ? ?Patient Details  ?Name: Douglas Peterson. ?MRN: [id number] ?Date of Birth: 1951-02-06 ? ?Transition of Care (TOC) CM/SW Contact:    ?Shelbie Hutching, RN ?Phone Number: ?03/18/2022, 1:04 PM ? ?Clinical Narrative:                 ?Patient placed under observation after falling at home.  Patient just recently discharged.  He is from home lives alone, has someone that comes out during the week for a couple hours a day.  [name] open with [facility] for home health services.  Sharmon Revere with [facility] notified patient is in the hospital.   ? ?Expected Discharge Plan: [place] ?Barriers to Discharge: Continued Medical Work up ? ? ?Patient Goals and CMS Choice ?Patient states their goals for this hospitalization and ongoing recovery are:: Agrees with home with home health ?CMS Medicare.gov Compare Post Acute Care list provided to:: Patient ?Choice offered to / list presented to : Patient ? ?Expected Discharge Plan and Services ?Expected Discharge Plan: [place] ?  ?Discharge Planning Services: CM Consult ?Post Acute Care Choice: Home Health ?Living arrangements for the past 2 months: [place] ?                ?DME Arranged: N/A ?DME Agency: NA ?  ?  ?  ?HH Arranged: PT, OT ?[place] Agency: [facility] ?Date HH Agency Contacted: 03/18/22 ?Time [place]: 5035 ?Representative spoke with at [place]: Malachy Mood ? ?Prior Living Arrangements/Services ?Living arrangements for the past 2 months: [place] ?Lives with:: Self ?Patient language and need for interpreter reviewed:: Yes ?Do you feel safe going back to the place where you live?: Yes      ?Need for Family Participation in Patient Care: Yes (Comment) ?Care giver support system in place?: No (comment) ?Current home services: DME ?Criminal Activity/Legal Involvement Pertinent to Current Situation/Hospitalization: No - Comment as  needed ? ?Activities of Daily Living ?  ?  ? ?Permission Sought/Granted ?Permission sought to share information with : Other (comment) ?  ?   ? Permission granted to share info w AGENCY: [facility] ?   ?   ? ?Emotional Assessment ?Appearance:: Appears stated age, [place], [place] ?Attitude/Demeanor/Rapport: Engaged ?Affect (typically observed): Accepting ?Orientation: : Oriented to Self, Oriented to Place, Oriented to  Time, Oriented to Situation ?Alcohol / Substance Use: Alcohol Use ?Psych Involvement: No (comment) ? ?Admission diagnosis:  Fall [W19.XXXA] ?Patient Active Problem List  ? Diagnosis Date Noted  ? Clavicle fracture 03/17/2022  ? Fall 03/17/2022  ? CAP (community acquired pneumonia) 03/13/2022  ? Altered mental status, unspecified 02/20/2022  ? Hypotension 02/19/2022  ? Dysphonia 08/13/2021  ? 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 Paresthesia of right upper extremity 08/13/2021  ? SIRS (systemic inflammatory response syndrome) ([place]) 08/05/2020  ? GERD (gastroesophageal reflux disease) 06/05/2020  ? Alcohol use disorder, severe, dependence ([place]) 05/28/2020  ? Fall at home, initial encounter 04/06/2020  ? Paroxysmal A-fib ([place]) 03/17/2020  ? Severe protein-calorie malnutrition ([place]) 02/25/2020  ? Primary insomnia 02/06/2020  ? Left inguinal hernia 01/31/2020  ? Encounter for competency evaluation   ? Pancytopenia ([place])   ? Alcoholic cirrhosis of liver without ascites ([place])   ? Thrombocytopenia ([place])   ? 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 Alcohol withdrawal delirium, acute, hyperactive ([place]) 09/21/2019  ? Pressure injury of ankle, stage  1 08/15/2019  ? Palliative care by specialist   ? Alcohol withdrawal ([place]) 03/20/2019  ? Closed fracture of right proximal humerus 03/19/2019  ? Vitamin D deficiency 01/11/2019  ? Osteoporosis 12/22/2018  ? Closed nondisplaced fracture of acromial process of right scapula with routine healing 11/15/2018  ? Compression fracture  of L2 vertebra with routine healing 11/15/2018  ? Delirium tremens ([place]) 03/08/2018  ? HTN (hypertension) 09/16/2017  ? 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 Steatohepatitis 12/03/2016  ? Abnormal liver enzymes 06/17/2016  ? Hereditary hemochromatosis ([place]) 06/17/2016  ? Anxiety 07/16/2015  ? Benign essential hypertension 05/22/2014  ? H/O traumatic brain injury 05/22/2014  ? 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[place]) 05/22/2014  ? ?[facility]P:  [facility] ?Pharmacy:   ?[facility] ?[place] [address] ?Phone: [phone] Fax: [phone] ? ? ? ? ?Social Determinants of Health (SDOH) Interventions ?  ? ?Readmission Risk Interventions ? ?  03/14/2022  ? 12:48 PM 08/23/2020  ? 11:52 AM 08/19/2020  ?  1:32 PM  ?Readmission Risk Prevention Plan  ?Transportation Screening Complete Complete Complete  ?PCP or Specialist Appt within 3-5 Days Complete    ?[place] or Home Care Consult Complete    ?Social Work Consult for [place] Planning/Counseling Complete    ?Palliative Care Screening Not Applicable    ?Medication Review Press photographer) Complete Complete Complete  ?PCP or Specialist appointment within 3-5 days of discharge   Complete  ?[place] or Home Care Consult  Complete   ?SW Recovery Care/Counseling Consult   Complete  ?Palliative Care Screening   Not Applicable  ?[place]  Not Applicable   ? ? ? ?

## 2022-03-18 NOTE — Assessment & Plan Note (Addendum)
Platelet count 132 >> 145. Monitor CBC.

## 2022-03-18 NOTE — Assessment & Plan Note (Signed)
With contraction of his right arm he will have to move his left arm. ?

## 2022-03-18 NOTE — Evaluation (Signed)
Physical Therapy Evaluation ?Patient Details ?Name: Douglas Peterson. ?MRN: [id number] ?DOB: Apr 28, 1951 ?Today's Date: 03/18/2022 ? ?History of Present Illness ? 70yoM here last week after fall at home with bilat hip pain, dry mouth, sore throat, he was discharged home and now returns after another fall at home, now with L clavicular fx . PMH: ETOH abuse with frequent aqdmissions in 2022 & 2021, AF, HTN, remote brain injury with chronic spastic hemiplegia. Pt has a legal guardian.  ?Clinical Impression ? Pt laying in bed eager to get up and try and see how much he can be up and walking.  Pt refusing sling for L UE/clavicular fracture citing "it's the only arm I can use."  He did, clearly, use if (socks, shoes, getting to EOB, etc) but never seemed to put heavy weight or get to excessive range through it with only minimal c/o increased pain t/o session.  Trial of HHA (no cane) ambulation and pt was able to show minimal WBing need during slow, but safe ambulation into the hall and back to room, he endorses that he can (and occasionally does) sit in w/c and use LEs to propel as well.   ?   ? ?Recommendations for follow up therapy are one component of a multi-disciplinary discharge planning process, led by the attending physician.  Recommendations may be updated based on patient status, additional functional criteria and insurance authorization. ? ?Follow Up Recommendations Home health PT ? ?  ?Assistance Recommended at Discharge Set up Supervision/Assistance  ?Patient can return home with the following ? Assist for transportation;A little help with bathing/dressing/bathroom;A little help with walking and/or transfers;Assistance with cooking/housework ? ?  ?Equipment Recommendations None recommended by PT  ?Recommendations for Other Services ?    ?  ?Functional Status Assessment    ? ?  ?Precautions / Restrictions Precautions ?Precautions: Fall ?Restrictions ?Weight Bearing Restrictions: No  ? ?  ? ?Mobility ? Bed  Mobility ?Overal bed mobility: Needs Assistance ?Bed Mobility: Supine to Sit ?  ?  ?Supine to sit: Supervision ?  ?  ?Douglas bed mobility comments: Pt was able to use L UE on rail to pull, does not endorse severe pain in L calvical area but having to be guarded/hesitant with range and effort ?  ? ?Transfers ?Overall transfer level: Needs assistance ?Equipment used: 1 person hand held assist ?Transfers: Sit to/from Stand ?Sit to Stand: Min guard ?  ?  ?  ?  ?  ?Douglas transfer comment: Pt was able to rise to standing and maintain balance with good confidence.  He did not have any unsteadiness, was not heavily reliant on UEs and reports feeling near his baseline apart from L shoulder hesitancy ?  ? ?Ambulation/Gait ?Ambulation/Gait assistance: Min guard ?Gait Distance (Feet): 75 Feet ?Assistive device: 1 person hand held assist (very light assist) ?  ?  ?  ?  ?Douglas Gait Details: Encouraged minimizing use (especially WBing) through the L Ue with the realistic understanding that he is entirely L handed and has, for a long time, needed L UE using cane for mobility. Pt was able to ambulate into the hallway with very little WBing reliance on [name] HHA; we discussed the reality that he will likely use SPC at discharge, focused on minimal UE WBing and also discussed possibly using w/c (LE propulsion) as a possibility going forward ? ?Stairs ?  ?  ?  ?  ?  ? ?Wheelchair Mobility ?  ? ?Modified Rankin (Stroke Patients Only) ?  ? ?  ? ?  Balance Overall balance assessment: History of Falls, Needs assistance ?Sitting-balance support: No upper extremity supported, Feet unsupported ?Sitting balance-Leahy Scale: Good ?  ?  ?Standing balance support: Single extremity supported ?Standing balance-Leahy Scale: Fair ?Standing balance comment: no overt LOBs or unsteadiness, able to maintain static standing and slow, guarded gait w/o direct UE support ?  ?  ?  ?  ?  ?  ?  ?  ?  ?  ?  ?   ? ? ? ?Pertinent Vitals/Pain Pain  Assessment ?Pain Assessment: 0-10 ?Pain Score: 4  ?Pain Location: L shoulder/collar bone  ? ? ?Home Living Family/patient expects to be discharged to:: Private residence ?Living Arrangements: Alone ?Available Help at Discharge: Family;Available PRN/intermittently ([place], reports a caregiver was to be starting 3hr/day this week) ?Type of Home: House ?Home Access: Stairs to enter ?Entrance Stairs-Rails: Right;Left ?Entrance Stairs-Number of Steps: 1 ?  ?Home Layout: One level ?Home Equipment: Kasandra Knudsen - single point;Cane - quad;Shower seat;Wheelchair - manual ?   ?  ?Prior Function Prior Level of Function : Independent/Modified Independent;History of Falls (last six months) ?  ?  ?  ?  ?  ?  ?Mobility Comments: SPC for ambulation with history of falls, still driving, running errands, etc ?ADLs Comments: Mod I with bathing/dressing; assist for cleaning.  Pt states he is supposed to be getting 3 hour of assistance daily from [place] beginning this week. ?  ? ? ?Hand Dominance  ? Dominant Hand: Left ? ?  ?Extremity/Trunk Assessment  ? Upper Extremity Assessment ?Upper Extremity Assessment:  (baseline R UE tone with no AROM, L UE very limited in elevation (2/2 clav fx) Pt comfortable with low intensity hand/wrist/elbow activity on the L with PT encouraging an awareness to allow L UE to heal and avoid extremes with ROM/WBing/usetc) ?  ? ?Lower Extremity Assessment ?Lower Extremity Assessment: Overall WFL for tasks assessed (baseline R sided tone (much less than UE) with at/near baseline strength R weaker than L) ?  ? ?   ?Communication  ?    ?Cognition Arousal/Alertness: Awake/alert ?Behavior During Therapy: [facility] for tasks assessed/performed ?Overall Cognitive Status: Within Functional Limits for tasks assessed ?  ?  ?  ?  ?  ?  ?  ?  ?  ?  ?  ?  ?  ?  ?  ?  ?  ?  ?  ? ?  ?Douglas Comments Douglas comments (skin integrity, edema, etc.): Pt was able to don his socks/shoes w/o assist (using L UE  with negligible c/o pain in limited ranges), showed little actual WBing reliance during ambulation with HHA. ? ?  ?Exercises    ? ?Assessment/Plan  ?  ?PT Assessment Patient needs continued PT services  ?PT Problem List Decreased strength;Decreased range of motion;Decreased activity tolerance;Decreased balance;Decreased mobility;Decreased cognition;Decreased safety awareness ? ?   ?  ?PT Treatment Interventions DME instruction;Gait training;Functional mobility training;Therapeutic activities;Therapeutic exercise;Balance training;Neuromuscular re-education   ? ?PT Goals (Current goals can be found in the Care Plan section)  ?Acute Rehab PT Goals ?Patient Stated Goal: to regain independence ?PT Goal Formulation: With patient ?Time For Goal Achievement: 04/01/22 ?Potential to Achieve Goals: Good ? ?  ?Frequency Min 2X/week ?  ? ? ?Co-evaluation   ?  ?  ?  ?  ? ? ?  ?AM-PAC PT "6 Clicks" Mobility  ?Outcome Measure Help needed turning from your back to your side while in a flat bed without using bedrails?: A Little ?  Help needed moving from lying on your back to sitting on the side of a flat bed without using bedrails?: A Little ?Help needed moving to and from a bed to a chair (including a wheelchair)?: None ?Help needed standing up from a chair using your arms (e.g., wheelchair or bedside chair)?: A Little ?Help needed to walk in hospital room?: A Little ?Help needed climbing 3-5 steps with a railing? : A Lot ?6 Click Score: 18 ? ?  ?End of Session Equipment Utilized During Treatment: Gait belt ?Activity Tolerance: Patient tolerated treatment well;Patient limited by fatigue ?Patient left: in chair;with call bell/phone within reach;with chair alarm set ?Nurse Communication: Mobility status ?PT Visit Diagnosis: Unsteadiness on feet (R26.81);History of falling (Z91.81);Muscle weakness (generalized) (M62.81) ?Pain - Right/Left: Left ?Pain - part of body: Shoulder ?  ? ?Time: 4158-3094 ?PT Time Calculation (min) (ACUTE ONLY):  58 min ? ? ?Charges:   PT Evaluation ?$PT Eval Low Complexity: 1 Low ?PT Treatments ?$Gait Training: 8-22 mins ?$Therapeutic Activity: 8-22 mins ?  ?   ? ? ?Kreg Shropshire, DPT ?03/18/2022, 1:19 PM ? ?

## 2022-03-18 NOTE — Hospital Course (Addendum)
71 year old man with chronic alcohol use with numerous hospitalizations for alcoholic encephalopathy and alcohol withdrawal.  Also has a history of paroxysmal atrial fibrillation, hypertension, severe protein calorie malnutrition.  He presented after a fall and found to have a left clavicle fracture. ? ?DSS is involved with patient and have apparently filed a protective order.  [name] unable to return home, felt to be a danger to himself.  ?

## 2022-03-18 NOTE — ED Notes (Addendum)
Lactic=2.2 c/o lab , Dr Posey Pronto made aware  ?

## 2022-03-18 NOTE — ED Notes (Signed)
This RN to bedside for assessment. Pt. States he will not be wearing a sling/immobilizer because that is his only usable arm. This RN encouraged pt. To allow sling application, pt. Refused. ?

## 2022-03-18 NOTE — Progress Notes (Signed)
?  Progress Note ? ? ?Patient: Douglas Peterson. WLN:[id number] DOB: 1951/10/27 DOA: 03/17/2022     0 ?DOS: the patient was seen and examined on 03/18/2022 ?  ?Brief history: ? ?71 year old man with chronic alcohol use with numerous hospitalizations for alcoholic encephalopathy and alcohol withdrawal.  Also has a history of paroxysmal atrial fibrillation hypertension severe protein calorie malnutrition.  He presented after a fall and found to have a left clavicle fracture. ? ?Assessment and Plan: ?* Alcohol use disorder, severe, dependence ([place]) ?We will give high-dose thiamine while here in the hospital.  Continue alcohol withdrawal protocol ? ? ?Nondisplaced fracture of shaft of left clavicle, initial encounter for closed fracture ?With contraction of his right arm he will have to move his left arm. ? ?HTN (hypertension) ?Continue atenolol ? ?Paroxysmal A-fib ([place]) ?Patient on Xarelto and atenolol. ? ?Severe protein-calorie malnutrition ([place]) ?Start Ensure ? ? ?BPH (benign prostatic hyperplasia) ?Continue Flomax ? ?Generalized weakness ?Well-known to our service with falls at home.  PT recommending home with home health. ? 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 ? ? ? ?  ? ?Subjective: Patient not feeling good.  States his last drink was 2 days ago.  Had a fall at home and fractured his left clavicle.  The basement has baseline right arm contraction. ? ?Physical Exam: ?Vitals:  ? 03/18/22 1130 03/18/22 1145 03/18/22 1215 03/18/22 1300  ?BP: 138/82   (!) 142/97  ?Pulse: 65 63 70 (!) 43  ?Resp:      ?Temp:      ?TempSrc:      ?SpO2: 98% 100% 96%   ?Weight:      ?Height:      ? ?Physical Exam ?HENT:  ?   Head: Normocephalic.  ?   Mouth/Throat:  ?   Pharynx: No oropharyngeal exudate.  ?Eyes:  ?   General: Lids are normal.  ?   Conjunctiva/sclera: Conjunctivae normal.  ?Cardiovascular:  ?   Rate and Rhythm: Normal rate and regular rhythm.  ?   Heart sounds: Normal heart sounds, S1  normal and S2 normal.  ?Pulmonary:  ?   Breath sounds: No decreased breath sounds, wheezing, rhonchi or rales.  ?Abdominal:  ?   Palpations: Abdomen is soft.  ?   Tenderness: There is no abdominal tenderness.  ?Musculoskeletal:  ?   Right lower leg: No swelling.  ?   Left lower leg: No swelling.  ?Skin: ?   General: Skin is warm.  ?   Findings: No rash.  ?Neurological:  ?   Mental Status: He is alert.  ?   Comments: Answers questions appropriately today.  Right arm contracture.  ?  ?Data Reviewed: ?Creatinine 0.57, AST elevated 51 ALT elevated at 50, lactic acid 2.2 initially, hemoglobin 14.6, platelet count 132 ? ? ?Disposition: ?Status is: Inpatient ?Remains inpatient appropriate because: Patient still complaining of weakness in with right arm contracture and left clavicle fracture he wants to work with physical therapy another day before going home.  He is well-known to our service with recent hospitalizations for alcoholic encephalopathy. ?Planned Discharge Destination: Home with Home Health ? ? ?Author: ?Loletha Grayer, MD ?03/18/2022 2:26 PM ? ?For on call review [email or web address].  ?

## 2022-03-18 NOTE — ED Notes (Signed)
Pt transferred from flex, no reports received. Pt seen AAOx3, ecchymosis noted to lt shoulder. Pt refused to wear sling. States lt arm is the functional arm . Request IV team consult to establish IV access for this Pt  ?

## 2022-03-18 NOTE — Care Management Obs Status (Signed)
MEDICARE OBSERVATION STATUS NOTIFICATION ? ? ?Patient Details  ?Name: Douglas Peterson. ?MRN: [id number] ?Date of Birth: 07/24/1951 ? ? ?Medicare Observation Status Notification Given:  Yes ? ? ? ?Shelbie Hutching, RN ?03/18/2022, 11:56 AM ?

## 2022-03-18 NOTE — TOC Progression Note (Addendum)
Transition of Care (TOC) - Progression Note  ? ? ?Patient Details  ?Name: Douglas Peterson. ?MRN: [id number] ?Date of Birth: 1950-12-24 ? ?Transition of Care (TOC) CM/SW Contact  ?Shelbie Hutching, RN ?Phone Number: ?03/18/2022, 1:42 PM ? ?Clinical Narrative:    ?RNCM received a call from Quentin Ore [phone], with [facility], she is very familiar with patient, the patient's sister has been keeping her updated on how the patient has been doing.   ?He was just discharged on Saturday and now back with a fall and clavicle fracture of his good shoulder.  Leanora Ivanoff is going to file a protective order so patient cannot go back home.   Patient continues to drink and is a danger to himself.  ? ? ?Expected Discharge Plan: [place] ?Barriers to Discharge: Continued Medical Work up ? ?Expected Discharge Plan and Services ?Expected Discharge Plan: [place] ?  ?Discharge Planning Services: CM Consult ?Post Acute Care Choice: Home Health ?Living arrangements for the past 2 months: [place] ?                ?DME Arranged: N/A ?DME Agency: NA ?  ?  ?  ?HH Arranged: PT, OT ?Why Agency: [place] ?Date HH Agency Contacted: 03/18/22 ?Time [place]: 3888 ?Representative spoke with at [place]: Malachy Mood ? ? ?Social Determinants of Health (SDOH) Interventions ?  ? ?Readmission Risk Interventions ? ?  03/14/2022  ? 12:48 PM 08/23/2020  ? 11:52 AM 08/19/2020  ?  1:32 PM  ?Readmission Risk Prevention Plan  ?Transportation Screening Complete Complete Complete  ?PCP or Specialist Appt within 3-5 Days Complete    ?[place] or Home Care Consult Complete    ?Social Work Consult for [place] Planning/Counseling Complete    ?Palliative Care Screening Not Applicable    ?Medication Review Press photographer) Complete Complete Complete  ?PCP or Specialist appointment within 3-5 days of discharge   Complete  ?[place] or Home Care Consult  Complete   ?SW Recovery  Care/Counseling Consult   Complete  ?Palliative Care Screening   Not Applicable  ?[place]  Not Applicable   ? ? ?

## 2022-03-18 NOTE — Plan of Care (Signed)
  Problem: Education: Goal: Knowledge of General Education information will improve Description Including pain rating scale, medication(s)/side effects and non-pharmacologic comfort measures Outcome: Progressing   

## 2022-03-18 NOTE — Assessment & Plan Note (Signed)
-   Continue Flomax 

## 2022-03-18 NOTE — Assessment & Plan Note (Signed)
Continue atenolol ?

## 2022-03-19 DIAGNOSIS — M2450 Contracture, unspecified joint: Secondary | ICD-10-CM | POA: Diagnosis present

## 2022-03-19 DIAGNOSIS — F1020 Alcohol dependence, uncomplicated: Secondary | ICD-10-CM | POA: Diagnosis not present

## 2022-03-19 MED ORDER — BACLOFEN 10 MG PO TABS
10.0000 mg | ORAL_TABLET | Freq: Three times a day (TID) | ORAL | Status: DC
  Administered 2022-03-19 – 2022-04-10 (×67): 10 mg via ORAL
  Filled 2022-03-19 (×67): qty 1

## 2022-03-19 NOTE — Progress Notes (Signed)
?Progress Note ? ? ?Patient: Douglas Peterson. PIR:[id number] DOB: 12/03/1950 DOA: 03/17/2022     1 ?DOS: the patient was seen and examined on 03/19/2022 ?  ?Brief hospital course: ?71 year old man with chronic alcohol use with numerous hospitalizations for alcoholic encephalopathy and alcohol withdrawal.  Also has a history of paroxysmal atrial fibrillation, hypertension, severe protein calorie malnutrition.  He presented after a fall and found to have a left clavicle fracture. ? ?DSS is involved with patient and have apparently filed a protective order.  [name] unable to return home, felt to be a danger to himself.  ? ?Assessment and Plan: ?* Alcohol use disorder, severe, dependence ([place]) ?Treating with high-dose thiamine while here in the hospital.   ?Continue CIWA protocol. ? ? ?Nondisplaced fracture of shaft of left clavicle, initial encounter for closed fracture ?With contraction of his right arm he will have to move his left arm. ? ?HTN (hypertension) ?Continue atenolol ? ?Paroxysmal A-fib ([place]) ?Continue Xarelto and atenolol. ? ?Severe protein-calorie malnutrition ([place]) ?Start Ensure ? ? ?Contracture of multiple joints ?Patient has spastic, contracted right upper extremity and hand.  This is chronic. ?-- Resume home baclofen ? ?BPH (benign prostatic hyperplasia) ?Continue Flomax ? ?Generalized weakness ?Well-known to our service with falls at home.  PT recommending home with home health. ? ?Fall ?In the setting of alcohol abuse and intoxication, sustained a displaced left clavicle fracture.  Patient had just been discharged 5/13, readmitted evening of 5/15. ? ?Clavicle fracture ?Present on admission, due to fall.  Left shoulder x-ray showed inferiorly displaced and overlapping mid left clavicular fracture. ?-- Maintain sling (patient declines to use because he relies on his left arm given contractures on the right) ?-- Pain control as needed ?-- PT OT ? 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 ?Monitor CBC. ? ? ? ? ?  ? ?Subjective: Patient was awake sitting up in bed when seen this morning on rounds.  He asks for his baclofen to be resumed having increased spasticity of his right arm does become more painful.  He is having 7-8 out of 10 pain from his left clavicle fracture at this time.  He says at best the pain is about a 2.  He had been given morphine about 30 minutes prior to my encounter.  He offers no other acute complaints at this time.  No acute events reported.  Patient has been declining to use the sling because he needs to be able to you move the left arm given the spasticity on the right ? ?Physical Exam: ?Vitals:  ? 03/18/22 2026 03/19/22 0009 03/19/22 0814 03/19/22 1146  ?BP: 140/77 138/85 118/68 139/73  ?Pulse: 66 66 78 75  ?Resp: 20 20 18 18   ?Temp: 98.1 ?F (36.7 ?C) 98.2 ?F (36.8 ?C) 99.1 ?F (37.3 ?C)   ?TempSrc:      ?SpO2: 93% 94% 93% 92%  ?Weight:      ?Height:      ? ?General exam: awake, alert, no acute distress, underweight chronically ill-appearing ?HEENT: atraumatic, clear conjunctiva, anicteric sclera, moist mucus membranes, hearing grossly normal  ?Respiratory system: CTAB, no wheezes, rales or rhonchi, normal respiratory effort. ?Cardiovascular system: normal S1/S2, RRR, no JVD, murmurs, rubs, gallops, no pedal edema.   ?Gastrointestinal system: soft, NT, ND ?Central nervous system: A&O x3. no gross focal neurologic deficits, normal speech but low volume ?Extremities: Severe contractures of right upper extremity and hand, no lower extremity edema ?Skin: dry, intact, normal temperature ?Psychiatry:  normal mood, congruent affect ? ? ?Data Reviewed: ? ?No new labs to review today ? ?Family Communication: None ? ?Disposition: ?Status is: Inpatient ?Remains inpatient appropriate because: DSS has protective order stating the patient is a danger to himself and unable to return home.  Awaiting safe disposition plan.  Remains on high-dose IV  thiamine ? ? Planned Discharge Destination: Anticipate will need long-term care /SNF ? ? ? ? ?Time spent: 40 minutes ? ?Author: ?Ezekiel Slocumb, DO ?03/19/2022 4:06 PM ? ?For on call review [email or web address].  ?

## 2022-03-19 NOTE — Assessment & Plan Note (Signed)
Patient has spastic, contracted right upper extremity and hand.  This is chronic. ?-- Resume home baclofen ?

## 2022-03-19 NOTE — Assessment & Plan Note (Signed)
In the setting of alcohol abuse and intoxication, sustained a displaced left clavicle fracture.  Patient had just been discharged 5/13, readmitted evening of 5/15. ?

## 2022-03-19 NOTE — Assessment & Plan Note (Addendum)
Present on admission, due to fall.  Left shoulder x-ray showed inferiorly displaced and overlapping mid left clavicular fracture. -- Maintain sling (patient declines to use because he relies on his left arm given contractures on the right) -- Pain control with scheduled Tylenol, PRN Tramadol, Lidocaine topical -- PT OT

## 2022-03-19 NOTE — Progress Notes (Signed)
Physical Therapy Treatment ?Patient Details ?Name: Douglas Peterson. ?MRN: [id number] ?DOB: 03-27-1951 ?Today's Date: 03/19/2022 ? ? ?History of Present Illness 70yoM here last week after fall at home with bilat hip pain, dry mouth, sore throat, he was discharged home and now returns after another fall at home, now with L clavicular fx . PMH: ETOH abuse with frequent aqdmissions in 2022 & 2021, AF, HTN, remote brain injury with chronic spastic hemiplegia. Pt has a legal guardian. ? ?  ?   ?Recommendations for follow up therapy are one component of a multi-disciplinary discharge planning process, led by the attending physician.  Recommendations may be updated based on patient status, additional functional criteria and insurance authorization. ? ?Follow Up Recommendations ? Home health PT ?  ?  ?Assistance Recommended at Discharge Set up Supervision/Assistance  ?Patient can return home with the following Assist for transportation;A little help with bathing/dressing/bathroom;A little help with walking and/or transfers;Assistance with cooking/housework ?  ?Equipment Recommendations ? None recommended by PT  ?  ?   ?Precautions / Restrictions Precautions ?Precautions: Fall ?Restrictions ?Weight Bearing Restrictions: No  ?  ? ?Mobility ? Bed Mobility ?Overal bed mobility: Needs Assistance ?Bed Mobility: Supine to Sit ? Supine to sit: Supervision ? General bed mobility comments: pt was able to progress from supine to EOB short sit without physical assistance. ? Transfers ?Overall transfer level: Needs assistance ?Equipment used: None ?Transfers: Sit to/from Stand ?Sit to Stand: Min guard ? General transfer comment: CGA for safety ?  ? ?Ambulation/Gait ?Ambulation/Gait assistance: Min guard ?Gait Distance (Feet): 80 Feet ?Assistive device: 1 person hand held assist ?Gait Pattern/deviations: WFL(Within Functional Limits) ?Gait velocity: decreased ?  ?  ?General Gait Details: pt was able to ambulate 80 ft with HHA at times. ? ?   ?Balance Overall balance assessment: History of Falls, Needs assistance ?Sitting-balance support: No upper extremity supported, Feet unsupported ?Sitting balance-Leahy Scale: Good ?  ?  ?Standing balance support: Bilateral upper extremity supported, During functional activity ?Standing balance-Leahy Scale: Fair ?  ?  ?Cognition Arousal/Alertness: Awake/alert ?Behavior During Therapy: [facility] for tasks assessed/performed ?Overall Cognitive Status: Within Functional Limits for tasks assessed ?  ? Following Commands: Follows one step commands consistently ?  ?  ?  ?General Comments: Pt A and O x 4 ?  ?  ? ?  ?   ?   ? ?Pertinent Vitals/Pain Pain Assessment ?Pain Assessment: 0-10 ?Pain Score: 3  ?Pain Location: L shoulder/collar bone ?Pain Descriptors / Indicators: Aching ?Pain Intervention(s): Limited activity within patient's tolerance, Monitored during session, Premedicated before session, Repositioned  ? ? ? ?PT Goals (current goals can now be found in the care plan section) Acute Rehab PT Goals ?Patient Stated Goal: to regain independence ?Progress towards PT goals: Progressing toward goals ? ?  ?Frequency ? ? ? Min 2X/week ? ? ? ?  ?PT Plan Current plan remains appropriate  ? ? ?Co-evaluation   ?  ?PT goals addressed during session: Mobility/safety with mobility;Balance;Proper use of DME;Strengthening/ROM ?  ?  ? ?  ?AM-PAC PT "6 Clicks" Mobility   ?Outcome Measure ? Help needed turning from your back to your side while in a flat bed without using bedrails?: A Little ?Help needed moving from lying on your back to sitting on the side of a flat bed without using bedrails?: A Little ?Help needed moving to and from a bed to a chair (including a wheelchair)?: A Little ?Help needed standing up from a chair using your arms (e.g., wheelchair  or bedside chair)?: A Little ?Help needed to walk in hospital room?: A Little ?Help needed climbing 3-5 steps with a railing? : A Lot ?6 Click Score: 17 ? ?  ?End of Session   ?Activity  Tolerance: Patient tolerated treatment well;Patient limited by fatigue ?Patient left: in chair;with call bell/phone within reach;with chair alarm set ?Nurse Communication: Mobility status ?PT Visit Diagnosis: Unsteadiness on feet (R26.81);History of falling (Z91.81);Muscle weakness (generalized) (M62.81) ?Pain - Right/Left: Left ?Pain - part of body: Shoulder ?  ? ? ?Time: 8889-1694 ?PT Time Calculation (min) (ACUTE ONLY): 13 min ? ?Charges:  $Gait Training: 8-22 mins          ?          ? ?Julaine Fusi PTA ?03/19/22, 3:20 PM  ? ?

## 2022-03-20 DIAGNOSIS — I480 Paroxysmal atrial fibrillation: Secondary | ICD-10-CM | POA: Diagnosis not present

## 2022-03-20 DIAGNOSIS — F1020 Alcohol dependence, uncomplicated: Secondary | ICD-10-CM | POA: Diagnosis not present

## 2022-03-20 DIAGNOSIS — S42025A Nondisplaced fracture of shaft of left clavicle, initial encounter for closed fracture: Secondary | ICD-10-CM | POA: Diagnosis not present

## 2022-03-20 DIAGNOSIS — M2450 Contracture, unspecified joint: Secondary | ICD-10-CM | POA: Diagnosis not present

## 2022-03-20 MED ORDER — TRAMADOL HCL 50 MG PO TABS
50.0000 mg | ORAL_TABLET | Freq: Four times a day (QID) | ORAL | Status: DC | PRN
  Administered 2022-03-21 – 2022-04-09 (×31): 50 mg via ORAL
  Filled 2022-03-20 (×32): qty 1

## 2022-03-20 MED ORDER — ACETAMINOPHEN 650 MG RE SUPP
650.0000 mg | Freq: Three times a day (TID) | RECTAL | Status: DC
  Filled 2022-03-20: qty 1

## 2022-03-20 MED ORDER — ACETAMINOPHEN 500 MG PO TABS
1000.0000 mg | ORAL_TABLET | Freq: Three times a day (TID) | ORAL | Status: DC
  Administered 2022-03-20 – 2022-04-10 (×63): 1000 mg via ORAL
  Filled 2022-03-20 (×64): qty 2

## 2022-03-20 MED ORDER — LIDOCAINE 4 % EX CREA
TOPICAL_CREAM | Freq: Three times a day (TID) | CUTANEOUS | Status: DC
  Filled 2022-03-20 (×6): qty 5

## 2022-03-20 MED ORDER — RIVAROXABAN 20 MG PO TABS
20.0000 mg | ORAL_TABLET | Freq: Every day | ORAL | Status: DC
  Administered 2022-03-21 – 2022-04-09 (×20): 20 mg via ORAL
  Filled 2022-03-20 (×22): qty 1

## 2022-03-20 NOTE — Progress Notes (Signed)
Progress Note   Patient: Douglas Peterson. TUU:[id number] DOB: 12/15/1950 DOA: 03/17/2022     2 DOS: the patient was seen and examined on 03/20/2022   Brief hospital course: 71 year old man with chronic alcohol use with numerous hospitalizations for alcoholic encephalopathy and alcohol withdrawal.  Also has a history of paroxysmal atrial fibrillation, hypertension, severe protein calorie malnutrition.  He presented after a fall and found to have a left clavicle fracture.  DSS is involved with patient and have apparently filed a protective order.  [name] unable to return home, felt to be a danger to himself.   Assessment and Plan: * Alcohol use disorder, severe, dependence ([place]) Treating with high-dose thiamine while here in the hospital.   Continue CIWA protocol.   Nondisplaced fracture of shaft of left clavicle, initial encounter for closed fracture With contraction of his right arm he will have to move his left arm.  Clavicle fracture Present on admission, due to fall.  Left shoulder x-ray showed inferiorly displaced and overlapping mid left clavicular fracture. -- Maintain sling (patient declines to use because he relies on his left arm given contractures on the right) -- Pain control with scheduled Tylenol, PRN Tramadol, Lidocaine topical -- PT OT  HTN (hypertension) Continue atenolol  Paroxysmal A-fib ([facility]) Continue Xarelto and atenolol.  Severe protein-calorie malnutrition ([place]) Start Ensure   Contracture of multiple joints Patient has spastic, contracted right upper extremity and hand.  This is chronic. -- Resume home baclofen  BPH (benign prostatic hyperplasia) Continue Flomax  Generalized weakness Well-known to our service with falls at home.  PT recommending home with home health.  Fall In the setting of alcohol abuse and intoxication, sustained a displaced left clavicle fracture.  Patient had just been discharged 5/13, readmitted evening of  5/15.  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 Monitor CBC.        Subjective: Patient was awake sitting up in bed when seen this morning on rounds.  States clavicle fracture pain is signficant but was able to use the arm to feed himself breakfast this AM.  No other acute complaints.  Says resuming baclofen helped his spasticity in the right arm and hand.  Physical Exam: Vitals:   03/19/22 2358 03/20/22 0444 03/20/22 0500 03/20/22 0902  BP: 125/73 140/75  137/76  Pulse: 65 (!) 59  62  Resp: 17 17  15   Temp: 98 F (36.7 C) 98.3 F (36.8 C)  97.6 F (36.4 C)  TempSrc: Oral     SpO2: 95% 95%  97%  Weight:   65 kg   Height:       General exam: awake, alert, no acute distress, underweight chronically ill-appearing HEENT: hearing grossly normal  Respiratory system: normal respiratory effort, on room air. Cardiovascular system: RRR, no pedal edema.   Central nervous system: A&O x3. no gross focal neurologic deficits, normal speech but low volume Extremities: Severe contractures of right upper extremity and hand, no lower extremity edema Skin: dry, intact, normal temperature Psychiatry: normal mood, congruent affect   Data Reviewed:  No new labs to review today  Family Communication: None  Disposition: Status is: Inpatient Remains inpatient appropriate because: DSS has protective order stating the patient is a danger to himself and unable to return home.  Awaiting safe disposition plan.  Remains on high-dose IV thiamine   Planned Discharge Destination: Anticipate will need long-term care /SNF     Time spent: 40 minutes  Author: Ezekiel Slocumb, DO  03/20/2022 2:17 PM  For on call review [email or web address].

## 2022-03-21 DIAGNOSIS — F1020 Alcohol dependence, uncomplicated: Secondary | ICD-10-CM | POA: Diagnosis not present

## 2022-03-21 MED ORDER — THIAMINE HCL 100 MG PO TABS
250.0000 mg | ORAL_TABLET | Freq: Every day | ORAL | Status: DC
  Administered 2022-03-21: 250 mg via ORAL
  Administered 2022-03-22: 300 mg via ORAL
  Administered 2022-03-23 – 2022-04-02 (×11): 250 mg via ORAL
  Filled 2022-03-21 (×14): qty 3

## 2022-03-21 NOTE — Progress Notes (Signed)
Physical Therapy Treatment Patient Details Name: Douglas Peterson. MRN: [id number] DOB: Dec 08, 1950 Today's Date: 03/21/2022   History of Present Illness 70yoM here last week after fall at home with bilat hip pain, dry mouth, sore throat, he was discharged home and now returns after another fall at home, now with L clavicular fx . PMH: ETOH abuse with frequent aqdmissions in 2022 & 2021, AF, HTN, remote brain injury with chronic spastic hemiplegia. Pt has a legal guardian.    PT Comments    Pt was sitting in recliner, He is alert and oriented and cooperative throughout. Was able to stand and ambulate without AD. Does endorse more L shoulder pain today but was able to ambulate with some unsteadiness. Pt needs supervision and assistance for safety with mobility. DSS involved. Follow Mds recs for DC. Would benefit from continued skilled PT to address deficits while maximizing independence with ADLs.    Recommendations for follow up therapy are one component of a multi-disciplinary discharge planning process, led by the attending physician.  Recommendations may be updated based on patient status, additional functional criteria and insurance authorization.  Follow Up Recommendations  Follow physician's recommendations for discharge plan and follow up therapies     Assistance Recommended at Discharge Set up Supervision/Assistance  Patient can return home with the following Assist for transportation;A little help with bathing/dressing/bathroom;A little help with walking and/or transfers;Assistance with cooking/housework   Equipment Recommendations  None recommended by PT       Precautions / Restrictions Precautions Precautions: Fall Restrictions Weight Bearing Restrictions: Yes     Mobility  Bed Mobility  General bed mobility comments: pt was in recliner pre/post session    Transfers Overall transfer level: Needs assistance Equipment used: None Transfers: Sit to/from Stand Sit to  Stand: Min guard, Min assist   Ambulation/Gait Ambulation/Gait assistance: Min guard, Min assist Gait Distance (Feet): 200 Feet Assistive device: 1 person hand held assist Gait Pattern/deviations: Step-through pattern, Shuffle, Staggering left, Staggering right, Drifts right/left Gait velocity: decreased     General Gait Details: pt was able to ambulate 200 ft without AD. Author does support pt's RUE to safety but overall did fairly well. Pt uses SPC at baseline but due to L clavicle fx elected not to use this session.      Balance Overall balance assessment: History of Falls, Needs assistance Sitting-balance support: No upper extremity supported, Feet unsupported Sitting balance-Leahy Scale: Good     Standing balance support: Bilateral upper extremity supported, During functional activity Standing balance-Leahy Scale: Fair Standing balance comment: no overt LOBs or unsteadiness, able to maintain static standing and slow, guarded gait w/o direct UE support      Cognition Arousal/Alertness: Awake/alert Behavior During Therapy: WFL for tasks assessed/performed Overall Cognitive Status: Within Functional Limits for tasks assessed Area of Impairment: Attention, Following commands, Safety/judgement, Awareness, Problem solving      Following Commands: Follows one step commands consistently, Follows one step commands with increased time Safety/Judgement: Decreased awareness of safety, Decreased awareness of deficits Awareness: Intellectual Problem Solving: Slow processing, Decreased initiation, Requires verbal cues General Comments: Pt A and O x 4               Pertinent Vitals/Pain Pain Assessment Pain Assessment: 0-10 Pain Score: 4  Pain Intervention(s): Limited activity within patient's tolerance, Monitored during session, Premedicated before session, Repositioned     PT Goals (current goals can now be found in the care plan section) Acute Rehab PT Goals Patient Stated  Goal: "  I want to go home." Progress towards PT goals: Progressing toward goals    Frequency    Min 2X/week      PT Plan Current plan remains appropriate       AM-PAC PT "6 Clicks" Mobility   Outcome Measure  Help needed turning from your back to your side while in a flat bed without using bedrails?: A Little Help needed moving from lying on your back to sitting on the side of a flat bed without using bedrails?: A Little Help needed moving to and from a bed to a chair (including a wheelchair)?: A Little Help needed standing up from a chair using your arms (e.g., wheelchair or bedside chair)?: A Little Help needed to walk in hospital room?: A Little Help needed climbing 3-5 steps with a railing? : A Lot 6 Click Score: 17    End of Session   Activity Tolerance: Patient tolerated treatment well Patient left: in chair;with call bell/phone within reach;with chair alarm set Nurse Communication: Mobility status PT Visit Diagnosis: Unsteadiness on feet (R26.81);History of falling (Z91.81);Muscle weakness (generalized) (M62.81) Pain - Right/Left: Left Pain - part of body: Shoulder     Time: 9024-0973 PT Time Calculation (min) (ACUTE ONLY): 10 min  Charges:  $Gait Training: 8-22 mins                     Julaine Fusi PTA 03/21/22, 1:23 PM

## 2022-03-21 NOTE — Progress Notes (Signed)
Progress Note   Patient: Douglas Peterson. SKA:[id number] DOB: 1951-02-10 DOA: 03/17/2022     3 DOS: the patient was seen and examined on 03/21/2022   Brief hospital course: 71 year old man with chronic alcohol use with numerous hospitalizations for alcoholic encephalopathy and alcohol withdrawal.  Also has a history of paroxysmal atrial fibrillation, hypertension, severe protein calorie malnutrition.  He presented after a fall and found to have a left clavicle fracture.  DSS is involved with patient and have apparently filed a protective order.  [name] unable to return home, felt to be a danger to himself.   Assessment and Plan: * Alcohol use disorder, severe, dependence ([place]) Treating with high-dose thiamine while here in the hospital.   Continue CIWA protocol.   Nondisplaced fracture of shaft of left clavicle, initial encounter for closed fracture With contraction of his right arm he will have to move his left arm.  Clavicle fracture Present on admission, due to fall.  Left shoulder x-ray showed inferiorly displaced and overlapping mid left clavicular fracture. -- Maintain sling (patient declines to use because he relies on his left arm given contractures on the right) -- Pain control with scheduled Tylenol, PRN Tramadol, Lidocaine topical -- PT OT  HTN (hypertension) Continue atenolol  Paroxysmal A-fib ([facility]) Continue Xarelto and atenolol.  Severe protein-calorie malnutrition ([place]) Start Ensure   Contracture of multiple joints Patient has spastic, contracted right upper extremity and hand.  This is chronic. -- Resume home baclofen  BPH (benign prostatic hyperplasia) Continue Flomax  Generalized weakness Well-known to our service with falls at home.  PT recommending home with home health.  Fall In the setting of alcohol abuse and intoxication, sustained a displaced left clavicle fracture.  Patient had just been discharged 5/13, readmitted evening of  5/15.  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 Monitor CBC.        Subjective: Patient up in recliner when seen this morning.  He reports pain control being little bit better.  He continues to use the left upper extremity because he is not able to use the right.  He has no other acute complaints today.  No acute events reported.  Physical Exam: Vitals:   03/21/22 0429 03/21/22 0500 03/21/22 0731 03/21/22 1110  BP: 127/64  121/76 (!) 100/51  Pulse: (!) 53  (!) 52 (!) 58  Resp: 16  14 16   Temp: 98.4 F (36.9 C)  97.6 F (36.4 C) 98.2 F (36.8 C)  TempSrc:      SpO2: 96%  96% 99%  Weight:  58.4 kg    Height:       General exam: awake, alert, no acute distress, underweight chronically ill-appearing HEENT: hearing grossly normal, moist mucous membranes Respiratory system: Lungs clear, normal respiratory effort on room air. Cardiovascular system: Regular rate and rhythm, no peripheral edema.   Central nervous system: A&O x3, grossly nonfocal exam, normal speech Extremities: Severe contractures of right upper extremity and hand, resolving ecchymosis of the anterior superior left shoulder Psychiatry: normal mood, congruent affect   Data Reviewed:  No new labs to review today  Family Communication: None  Disposition: Status is: Inpatient Remains inpatient appropriate because: DSS has protective order stating the patient is a danger to himself and unable to return home.  Awaiting safe disposition plan.     Planned Discharge Destination: Anticipate will need long-term care /SNF     Time spent: 35 minutes including time spent at bedside, and coordination of care  Author: Ezekiel Slocumb, DO 03/21/2022 1:46 PM  For on call review [email or web address].

## 2022-03-22 DIAGNOSIS — F1020 Alcohol dependence, uncomplicated: Secondary | ICD-10-CM | POA: Diagnosis not present

## 2022-03-22 LAB — BASIC METABOLIC PANEL
Anion gap: 8 (ref 5–15)
BUN: 12 mg/dL (ref 8–23)
CO2: 29 mmol/L (ref 22–32)
Calcium: 9.4 mg/dL (ref 8.9–10.3)
Chloride: 103 mmol/L (ref 98–111)
Creatinine, Ser: 0.54 mg/dL — ABNORMAL LOW (ref 0.61–1.24)
GFR, Estimated: >60 mL/min (ref >60)
Glucose, Bld: 88 mg/dL (ref 70–99)
Potassium: 4.3 mmol/L (ref 3.5–5.1)
Sodium: 140 mmol/L (ref 135–145)

## 2022-03-22 LAB — CBC
HCT: 40.2 % (ref 39.0–52.0)
Hemoglobin: 13.3 g/dL (ref 13.0–17.0)
MCH: 31.6 pg (ref 26.0–34.0)
MCHC: 33.1 g/dL (ref 30.0–36.0)
MCV: 95.5 fL (ref 80.0–100.0)
Platelets: 145 10*3/uL — ABNORMAL LOW (ref 150–400)
RBC: 4.21 MIL/uL — ABNORMAL LOW (ref 4.22–5.81)
RDW: 12.5 % (ref 11.5–15.5)
WBC: 7.2 10*3/uL (ref 4.0–10.5)
nRBC: 0 % (ref 0.0–0.2)

## 2022-03-22 LAB — MAGNESIUM: Magnesium: 1.9 mg/dL (ref 1.7–2.4)

## 2022-03-22 NOTE — Progress Notes (Signed)
  Progress Note   Patient: Douglas Peterson. [id number]:[id number] DOB: 02/12/1951 DOA: 03/17/2022     4 DOS: the patient was seen and examined on 03/22/2022   Brief hospital course: 71 year old man with chronic alcohol use with numerous hospitalizations for alcoholic encephalopathy and alcohol withdrawal.  Also has a history of paroxysmal atrial fibrillation, hypertension, severe protein calorie malnutrition.  He presented after a fall and found to have a left clavicle fracture.  DSS is involved with patient and have apparently filed a protective order.  [name] unable to return home, felt to be a danger to himself.   Assessment and Plan: * Alcohol use disorder, severe, dependence ([place]) Treating with high-dose thiamine while here in the hospital.   Continue CIWA protocol.   Nondisplaced fracture of shaft of left clavicle, initial encounter for closed fracture With contraction of his right arm he will have to move his left arm.  Clavicle fracture Present on admission, due to fall.  Left shoulder x-ray showed inferiorly displaced and overlapping mid left clavicular fracture. -- Maintain sling (patient declines to use because he relies on his left arm given contractures on the right) -- Pain control with scheduled Tylenol, PRN Tramadol, Lidocaine topical -- PT OT  HTN (hypertension) Continue atenolol  Paroxysmal A-fib (HCC) Continue Xarelto and atenolol.  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  This is chronic. -- Resume home baclofen  BPH (benign prostatic hyperplasia) Continue Flomax  Generalized weakness Well-known to our service with falls at home.  PT recommending home with home health.  Fall In the setting of alcohol abuse and intoxication, sustained a displaced left clavicle fracture.  Patient had just been discharged 5/13, readmitted evening of  5/15.  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 Monitor CBC.        Subjective: Patient up in recliner when seen this morning.  Has ongoing left shoulder pain with clavicle fracture, says it's better at times after morphine.  No other acute complaints.   Physical Exam: Vitals:   03/22/22 0443 03/22/22 0445 03/22/22 0716 03/22/22 1119  BP:  123/84 133/75 (!) 144/76  Pulse:  (!) 55 (!) 56 (!) 56  Resp:  16 15 17   Temp:  97.8 F (36.6 C) 97.9 F (36.6 C) 98.3 F (36.8 C)  TempSrc:  Oral  Oral  SpO2:  95% 96% 97%  Weight: 59.5 kg     Height:       General exam: awake, alert, no acute distress, underweight chronically ill-appearing Respiratory system: normal respiratory effort, on room air. Cardiovascular system: brisk cap refill, RRR Central nervous system: A&O x3, grossly nonfocal exam, normal speech Extremities: Severe contractures of right upper extremity and hand, resolving ecchymosis of the anterior superior left shoulder Psychiatry: normal mood, congruent affect   Data Reviewed:  Notable labs: Cr 0.54 Platelets 145k up from 132k  Family Communication: None  Disposition: Status is: Inpatient Remains inpatient appropriate because: DSS has protective order stating the patient is a danger to himself and unable to return home.  Awaiting safe disposition plan.     Planned Discharge Destination: Anticipate will need long-term care /SNF     Time spent: 25 minutes   Author: Ezekiel Slocumb, DO 03/22/2022 1:00 PM  For on call review [email or web address].

## 2022-03-22 NOTE — Plan of Care (Signed)

## 2022-03-23 DIAGNOSIS — F1020 Alcohol dependence, uncomplicated: Secondary | ICD-10-CM | POA: Diagnosis not present

## 2022-03-23 NOTE — Progress Notes (Signed)
  Progress Note   Patient: Douglas Peterson. [id number] DOB: Aug 02, 1951 DOA: 03/17/2022     5 DOS: the patient was seen and examined on 03/23/2022   Brief hospital course: 71 year old man with chronic alcohol use with numerous hospitalizations for alcoholic encephalopathy and alcohol withdrawal.  Also has a history of paroxysmal atrial fibrillation, hypertension, severe protein calorie malnutrition.  He presented after a fall and found to have a left clavicle fracture.  DSS is involved with patient and have apparently filed a protective order.  [name] unable to return home, felt to be a danger to himself.   Assessment and Plan: * Alcohol use disorder, severe, dependence ([place]) Treating with high-dose thiamine while here in the hospital.   Continue CIWA protocol.   Nondisplaced fracture of shaft of left clavicle, initial encounter for closed fracture With contraction of his right arm he will have to move his left arm.  Clavicle fracture Present on admission, due to fall.  Left shoulder x-ray showed inferiorly displaced and overlapping mid left clavicular fracture. -- Maintain sling (patient declines to use because he relies on his left arm given contractures on the right) -- Pain control with scheduled Tylenol, PRN Tramadol, Lidocaine topical -- PT OT  HTN (hypertension) Continue atenolol  Paroxysmal A-fib (HCC) Continue Xarelto and atenolol.  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  This is chronic. -- Resume home baclofen  BPH (benign prostatic hyperplasia) Continue Flomax  Generalized weakness Well-known to our service with falls at home.  PT recommending home with home health.  Fall In the setting of alcohol abuse and intoxication, sustained a displaced left clavicle fracture.  Patient had just been discharged 5/13, readmitted evening of  5/15.  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 Monitor CBC.        Subjective: Patient awake sitting up in bed when seen on rounds today.  He reports left clavicle fracture pain overall improving but still bothers him.  He asks for morphine.  No other acute complaints at this time no acute events reported  Physical Exam: Vitals:   03/22/22 2019 03/23/22 0347 03/23/22 0500 03/23/22 0824  BP: 140/66 (!) 146/66  (!) 141/72  Pulse: 61 (!) 58  (!) 59  Resp: 20 17  18   Temp: 97.9 F (36.6 C) 98 F (36.7 C)  98.1 F (36.7 C)  TempSrc:    Oral  SpO2: 100% 95%  95%  Weight:   58.6 kg   Height:       General exam: Awake and alert, chronically ill-appearing, no acute distress Respiratory system: normal respiratory effort, on room air. Cardiovascular system: Regular rhythm, no peripheral edema Central nervous system: Alert and oriented x3, grossly nonfocal exam, normal speech Extremities: Right upper extremity contractures, resolving ecchymosis of the anterior superior left shoulder overlying fracture Psychiatry: normal mood, congruent affect   Data Reviewed: No new labs to review today  Family Communication: None  Disposition: Status is: Inpatient Remains inpatient appropriate because: DSS has protective order stating the patient is a danger to himself and unable to return home.  Awaiting safe disposition plan.     Planned Discharge Destination: Anticipate will need long-term care /SNF     Time spent: 25 minutes   Author: Ezekiel Slocumb, DO 03/23/2022 3:49 PM  For on call review [email or web address].

## 2022-03-23 NOTE — Plan of Care (Signed)

## 2022-03-24 DIAGNOSIS — F1020 Alcohol dependence, uncomplicated: Secondary | ICD-10-CM | POA: Diagnosis not present

## 2022-03-24 NOTE — Progress Notes (Signed)
  Progress Note   Patient: Douglas Peterson. [id number] DOB: [birth year] DOA: 03/17/2022     6 DOS: the patient was seen and examined on 03/24/2022   Brief hospital course: 71 year old man with chronic alcohol use with numerous hospitalizations for alcoholic encephalopathy and alcohol withdrawal.  Also has a history of paroxysmal atrial fibrillation, hypertension, severe protein calorie malnutrition.  He presented after a fall and found to have a left clavicle fracture.  DSS is involved with patient and have apparently filed a protective order.  [name] unable to return home, felt to be a danger to himself.   Assessment and Plan: * Alcohol use disorder, severe, dependence ([place]) Treating with high-dose thiamine while here in the hospital.   Continue CIWA protocol.   Nondisplaced fracture of shaft of left clavicle, initial encounter for closed fracture With contraction of his right arm he will have to move his left arm.  Clavicle fracture Present on admission, due to fall.  Left shoulder x-ray showed inferiorly displaced and overlapping mid left clavicular fracture. -- Maintain sling (patient declines to use because he relies on his left arm given contractures on the right) -- Pain control with scheduled Tylenol, PRN Tramadol, Lidocaine topical -- PT OT  HTN (hypertension) Continue atenolol  Paroxysmal A-fib (HCC) Continue Xarelto and atenolol.  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  This is chronic. -- Resume home baclofen  BPH (benign prostatic hyperplasia) Continue Flomax  Generalized weakness Well-known to our service with falls at home.  PT recommending home with home health.  Fall In the setting of alcohol abuse and intoxication, sustained a displaced left clavicle fracture.  Patient had just been discharged 5/13, readmitted evening of  5/15.  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 Monitor CBC.        Subjective: Patient up in recliner seen after eating breakfast morning.  He is in good spirits reports overall feeling well.  His left shoulder continues to hurt but slowly improving a little today.  No other acute complaints at this time.  Physical Exam: Vitals:   03/23/22 2038 03/24/22 0500 03/24/22 0603 03/24/22 0806  BP: (!) 150/71  129/78 139/75  Pulse: 62  (!) 54 (!) 53  Resp: 20  18 18   Temp: 97.6 F (36.4 C)  98.2 F (36.8 C) 98.3 F (36.8 C)  TempSrc:      SpO2: 99%  96% 97%  Weight:  58.1 kg    Height:       General exam: Sitting up in recliner, awake and alert, chronically ill-appearing, no acute distress Respiratory system: Lungs clear bilaterally without wheezes rales or rhonchi, normal respiratory effort on room air Cardiovascular system: Regular rhythm, normal S1/S2, no peripheral edema Central nervous system: Alert and oriented x3, grossly nonfocal exam, normal speech Extremities: Right upper extremity contractures, no edema or cyanosis Psychiatry: normal mood, congruent affect   Data Reviewed: No new labs to review today  Family Communication: None  Disposition: Status is: Inpatient Remains inpatient appropriate because: DSS has protective order stating the patient is a danger to himself and unable to return home.  Awaiting safe disposition plan.     Planned Discharge Destination: Anticipate will need long-term care /SNF     Time spent: 25 minutes   Author: Ezekiel Slocumb, DO 03/24/2022 2:22 PM  For on call review [email or web address].

## 2022-03-24 NOTE — Plan of Care (Signed)

## 2022-03-24 NOTE — TOC Progression Note (Signed)
Transition of Care (TOC) - Progression Note    Patient Details  Name: Douglas Peterson. MRN: [id number] Date of Birth: Jan 25, 1951  Transition of Care [facility]) CM/SW Contact  Eileen Stanford, LCSW Phone Number: 03/24/2022, 2:39 PM  Clinical Narrative:   CSW called DSS worker Leanora Ivanoff Marrow requesting updated-- left voicmail.    Expected Discharge Plan: [place] Barriers to Discharge: Continued Medical Work up  Expected Discharge Plan and Services Expected Discharge Plan: [place]   Discharge Planning Services: CM Consult Post Acute Care Choice: [place] arrangements for the past 2 months: Single Family Home Expected Discharge Date: 03/21/22               DME Arranged: N/A DME Agency: NA       HH Arranged: PT, OT HH Agency: [place] Date HH Agency Contacted: 03/18/22 Time [place]: [place] Representative spoke with at [place]: [place] ([place]) Interventions    Readmission Risk Interventions    03/14/2022   12:48 PM 08/23/2020   11:52 AM 08/19/2020    1:32 PM  Readmission Risk Prevention Plan  Transportation Screening Complete Complete Complete  PCP or Specialist Appt within 3-5 Days Complete    HRI or [place] Complete    Social Work Consult for [place] Planning/Counseling Complete    Palliative Care Screening Not Applicable    Medication Review Press photographer) Complete Complete Complete  PCP or Specialist appointment within 3-5 days of discharge   Complete  HRI or [place]  Complete   SW Recovery Care/Counseling Consult   Complete  [place]  Not Applicable

## 2022-03-24 NOTE — Progress Notes (Signed)
Physical Therapy Treatment Patient Details Name: Douglas Peterson. MRN: [id number] DOB: 07/31/1951 Today's Date: 03/24/2022   History of Present Illness 70yoM here last week after fall at home with bilat hip pain, dry mouth, sore throat, he was discharged home and now returns after another fall at home, now with L clavicular fx . PMH: ETOH abuse with frequent aqdmissions in 2022 & 2021, AF, HTN, remote brain injury with chronic spastic hemiplegia. Pt has a legal guardian.    PT Comments    Pt ready for session.  To EOB with rail but no assist. Stood and is able to walk to/from bathroom then 3 laps on unit with no AD.  Gait remains generally unsteady and cautious but does not need physical assist to correct disturbances.  He does catch his foot on doorframe leaving bathroom but is able to correct with min guard.  No AD used due to clavicle fx but does use SPC at baseline.     Recommendations for follow up therapy are one component of a multi-disciplinary discharge planning process, led by the attending physician.  Recommendations may be updated based on patient status, additional functional criteria and insurance authorization.  Follow Up Recommendations  Follow physician's recommendations for discharge plan and follow up therapies     Assistance Recommended at Discharge Set up Supervision/Assistance  Patient can return home with the following Assist for transportation;A little help with bathing/dressing/bathroom;A little help with walking and/or transfers;Assistance with cooking/housework   Equipment Recommendations       Recommendations for Other Services       Precautions / Restrictions Precautions Precautions: Fall Restrictions Weight Bearing Restrictions: No     Mobility  Bed Mobility Overal bed mobility: Modified Independent Bed Mobility: Supine to Sit           General bed mobility comments: with rail    Transfers Overall transfer level: Needs  assistance Equipment used: None Transfers: Sit to/from Stand Sit to Stand: Min guard, Min assist           General transfer comment: CGA for safety    Ambulation/Gait Ambulation/Gait assistance: Min guard Gait Distance (Feet): 500 Feet Assistive device: 1 person hand held assist Gait Pattern/deviations: Step-through pattern, Shuffle, Staggering left, Staggering right, Drifts right/left Gait velocity: decreased     General Gait Details: uses SPC L at baseline but was not used due to clavicle fx   Stairs             Wheelchair Mobility    Modified Rankin (Stroke Patients Only)       Balance Overall balance assessment: History of Falls, Needs assistance Sitting-balance support: No upper extremity supported, Feet unsupported Sitting balance-Leahy Scale: Good     Standing balance support: Bilateral upper extremity supported, During functional activity Standing balance-Leahy Scale: Fair Standing balance comment: no overt LOBs or unsteadiness, able to maintain static standing and slow, guarded gait w/o direct UE support                            Cognition Arousal/Alertness: Awake/alert Behavior During Therapy: WFL for tasks assessed/performed Overall Cognitive Status: Within Functional Limits for tasks assessed                                          Exercises      General Comments  Pertinent Vitals/Pain Pain Assessment Pain Assessment: Faces Faces Pain Scale: Hurts even more Pain Location: L shoulder/collar bone Pain Descriptors / Indicators: Aching Pain Intervention(s): Monitored during session, Limited activity within patient's tolerance, Repositioned    Home Living                          Prior Function            PT Goals (current goals can now be found in the care plan section) Progress towards PT goals: Progressing toward goals    Frequency    Min 2X/week      PT Plan Current plan  remains appropriate    Co-evaluation              AM-PAC PT "6 Clicks" Mobility   Outcome Measure  Help needed turning from your back to your side while in a flat bed without using bedrails?: None Help needed moving from lying on your back to sitting on the side of a flat bed without using bedrails?: A Little Help needed moving to and from a bed to a chair (including a wheelchair)?: A Little Help needed standing up from a chair using your arms (e.g., wheelchair or bedside chair)?: A Little Help needed to walk in hospital room?: A Little Help needed climbing 3-5 steps with a railing? : A Lot 6 Click Score: 18    End of Session Equipment Utilized During Treatment: Gait belt Activity Tolerance: Patient tolerated treatment well Patient left: in chair;with call bell/phone within reach;with chair alarm set Nurse Communication: Mobility status PT Visit Diagnosis: Unsteadiness on feet (R26.81);History of falling (Z91.81);Muscle weakness (generalized) (M62.81) Pain - Right/Left: Left Pain - part of body: Shoulder     Time: 7322-0254 PT Time Calculation (min) (ACUTE ONLY): 18 min  Charges:  $Gait Training: 8-22 mins                   Chesley Noon, PTA 03/24/22, 10:06 AM

## 2022-03-25 DIAGNOSIS — F1020 Alcohol dependence, uncomplicated: Secondary | ICD-10-CM | POA: Diagnosis not present

## 2022-03-25 NOTE — Progress Notes (Signed)
Physical Therapy Treatment Patient Details Name: Douglas Peterson. MRN: [id number] DOB: 01-20-1951 Today's Date: 03/25/2022   History of Present Illness 70yoM here last week after fall at home with bilat hip pain, dry mouth, sore throat, he was discharged home and now returns after another fall at home, now with L clavicular fx . PMH: ETOH abuse with frequent aqdmissions in 2022 & 2021, AF, HTN, remote brain injury with chronic spastic hemiplegia. Pt has a legal guardian.    PT Comments    Pt in chair.  Able to stand and walk x 3 laps with no AD and min guard.  He does have some dec gait quality as he fatigues and does need cues to stop and "reset" balance as he did seem to be falling forward and trying to keep his feet up.  He stated he did "feel it" but did need cues to correct.  Remained up in chair for breakfast.   Recommendations for follow up therapy are one component of a multi-disciplinary discharge planning process, led by the attending physician.  Recommendations may be updated based on patient status, additional functional criteria and insurance authorization.  Follow Up Recommendations  Follow physician's recommendations for discharge plan and follow up therapies     Assistance Recommended at Discharge Set up Supervision/Assistance  Patient can return home with the following Assist for transportation;A little help with bathing/dressing/bathroom;A little help with walking and/or transfers;Assistance with cooking/housework   Equipment Recommendations       Recommendations for Other Services       Precautions / Restrictions Precautions Precautions: Fall Restrictions Weight Bearing Restrictions: No     Mobility  Bed Mobility               General bed mobility comments: in recliner before and after    Transfers Overall transfer level: Needs assistance Equipment used: None Transfers: Sit to/from Stand Sit to Stand: Min guard, Min assist           General  transfer comment: CGA for safety    Ambulation/Gait Ambulation/Gait assistance: Min guard   Assistive device: 1 person hand held assist Gait Pattern/deviations: Step-through pattern, Shuffle, Staggering left, Staggering right, Drifts right/left Gait velocity: decreased     General Gait Details: uses SPC L at baseline but was not used due to clavicle fx   Stairs             Wheelchair Mobility    Modified Rankin (Stroke Patients Only)       Balance Overall balance assessment: History of Falls, Needs assistance Sitting-balance support: No upper extremity supported, Feet unsupported Sitting balance-Leahy Scale: Good     Standing balance support: Bilateral upper extremity supported, During functional activity Standing balance-Leahy Scale: Fair Standing balance comment: no overt LOBs or unsteadiness, able to maintain static standing and slow, guarded gait w/o direct UE support.  does need cues to stop and "reset" x 1 as he was decreasing quality with fatigue                            Cognition Arousal/Alertness: Awake/alert Behavior During Therapy: WFL for tasks assessed/performed Overall Cognitive Status: Within Functional Limits for tasks assessed                                          Exercises  General Comments        Pertinent Vitals/Pain Pain Assessment Pain Assessment: Faces Faces Pain Scale: Hurts little more Pain Location: L shoulder/collar bone Pain Intervention(s): Limited activity within patient's tolerance, Monitored during session, Repositioned    Home Living                          Prior Function            PT Goals (current goals can now be found in the care plan section) Progress towards PT goals: Progressing toward goals    Frequency    Min 2X/week      PT Plan Current plan remains appropriate    Co-evaluation              AM-PAC PT "6 Clicks" Mobility   Outcome  Measure  Help needed turning from your back to your side while in a flat bed without using bedrails?: None Help needed moving from lying on your back to sitting on the side of a flat bed without using bedrails?: A Little Help needed moving to and from a bed to a chair (including a wheelchair)?: A Little Help needed standing up from a chair using your arms (e.g., wheelchair or bedside chair)?: A Little Help needed to walk in hospital room?: A Little Help needed climbing 3-5 steps with a railing? : A Lot 6 Click Score: 18    End of Session Equipment Utilized During Treatment: Gait belt Activity Tolerance: Patient tolerated treatment well Patient left: in chair;with call bell/phone within reach;with chair alarm set Nurse Communication: Mobility status PT Visit Diagnosis: Unsteadiness on feet (R26.81);History of falling (Z91.81);Muscle weakness (generalized) (M62.81) Pain - Right/Left: Left Pain - part of body: Shoulder     Time: 0600-4599 PT Time Calculation (min) (ACUTE ONLY): 12 min  Charges:  $Gait Training: 8-22 mins                   Chesley Noon, PTA 03/25/22, 10:42 AM

## 2022-03-25 NOTE — Plan of Care (Signed)

## 2022-03-25 NOTE — Progress Notes (Signed)
  Progress Note   Patient: Douglas Peterson. [id number]:[id number] DOB: 1950-12-19 DOA: 03/17/2022     7 DOS: the patient was seen and examined on 03/25/2022   Brief hospital course: 71 year old man with chronic alcohol use with numerous hospitalizations for alcoholic encephalopathy and alcohol withdrawal.  Also has a history of paroxysmal atrial fibrillation, hypertension, severe protein calorie malnutrition.  He presented after a fall and found to have a left clavicle fracture.  DSS is involved with patient and have apparently filed a protective order.  [name] unable to return home, felt to be a danger to himself.   Assessment and Plan: * Alcohol use disorder, severe, dependence ([place]) Treating with high-dose thiamine while here in the hospital.   Continue CIWA protocol.   Nondisplaced fracture of shaft of left clavicle, initial encounter for closed fracture With contraction of his right arm he will have to move his left arm.  Clavicle fracture Present on admission, due to fall.  Left shoulder x-ray showed inferiorly displaced and overlapping mid left clavicular fracture. -- Maintain sling (patient declines to use because he relies on his left arm given contractures on the right) -- Pain control with scheduled Tylenol, PRN Tramadol, Lidocaine topical -- PT OT  HTN (hypertension) Continue atenolol  Paroxysmal A-fib (HCC) Continue Xarelto and atenolol.  Severe protein-calorie malnutrition ([place]) Start Ensure   Contracture of multiple joints Patient has spastic, contracted right upper extremity and hand.  This is chronic. -- Resume home baclofen  BPH (benign prostatic hyperplasia) Continue Flomax  Generalized weakness Well-known to our service with falls at home.  PT recommending home with home health.  Fall In the setting of alcohol abuse and intoxication, sustained a displaced left clavicle fracture.  Patient had just been discharged 5/13, readmitted evening of  5/15.  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 Monitor CBC.        Subjective: Patient sitting up in recliner when seen today.  He has ongoing left shoulder pain but no other acute complaints.  Physical Exam: Vitals:   03/24/22 2129 03/25/22 0500 03/25/22 0523 03/25/22 0844  BP: 129/70  126/83 124/79  Pulse: 60  (!) 58 (!) 57  Resp: 19  18 16   Temp: 98.1 F (36.7 C)  97.6 F (36.4 C) 98.4 F (36.9 C)  TempSrc:    Oral  SpO2: 97%  96% 95%  Weight:  57.4 kg    Height:       General exam: Sitting up in recliner, awake alert, no acute distress, chronically ill-appearing Respiratory system: Normal respiratory effort, on room air Cardiovascular system: Regular in rhythm, no peripheral edema Central nervous system: A&O x3, normal speech, aside from right upper extremity contracture no gross focal deficits Extremities: Right upper extremity contractures, no edema or cyanosis left shoulder ROM appears somewhat improved Psychiatry: normal mood, congruent affect   Data Reviewed: No new labs to review today  Family Communication: None  Disposition: Status is: Inpatient Remains inpatient appropriate because: DSS has protective order stating the patient is a danger to himself and unable to return home.  Awaiting safe disposition plan.     Planned Discharge Destination: Anticipate will need long-term care /SNF     Time spent: 25 minutes   Author: Ezekiel Slocumb, DO 03/25/2022 1:53 PM  For on call review [email or web address].

## 2022-03-26 DIAGNOSIS — F1020 Alcohol dependence, uncomplicated: Secondary | ICD-10-CM | POA: Diagnosis not present

## 2022-03-26 DIAGNOSIS — S42025A Nondisplaced fracture of shaft of left clavicle, initial encounter for closed fracture: Secondary | ICD-10-CM | POA: Diagnosis not present
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
## 2022-03-26 NOTE — Plan of Care (Signed)
  Problem: Education: Goal: Knowledge of General Education information will improve Description: Including pain rating scale, medication(s)/side effects and non-pharmacologic comfort measures Outcome: Progressing   Problem: Health Behavior/Discharge Planning: Goal: Ability to manage health-related needs will improve Outcome: Progressing   Problem: Clinical Measurements: Goal: Ability to maintain clinical measurements within normal limits will improve Outcome: Progressing   Problem: Clinical Measurements: Goal: Will remain free from infection Outcome: Progressing   Problem: Clinical Measurements: Goal: Cardiovascular complication will be avoided Outcome: Progressing   Problem: Clinical Measurements: Goal: Respiratory complications will improve Outcome: Progressing   Problem: Activity: Goal: Risk for activity intolerance will decrease Outcome: Progressing   Problem: Nutrition: Goal: Adequate nutrition will be maintained Outcome: Progressing   Problem: Coping: Goal: Level of anxiety will decrease Outcome: Progressing   Problem: Pain Managment: Goal: General experience of comfort will improve Outcome: Progressing   Problem: Safety: Goal: Ability to remain free from injury will improve Outcome: Progressing   Problem: Skin Integrity: Goal: Risk for impaired skin integrity will decrease Outcome: Progressing

## 2022-03-26 NOTE — Progress Notes (Signed)
Physical Therapy Treatment Patient Details Name: Douglas Peterson. MRN: [id number] DOB: 1951-09-24 Today's Date: 03/26/2022   History of Present Illness 70yoM here last week after fall at home with bilat hip pain, dry mouth, sore throat, he was discharged home and now returns after another fall at home, now with L clavicular fx . PMH: ETOH abuse with frequent aqdmissions in 2022 & 2021, AF, HTN, remote brain injury with chronic spastic hemiplegia. Pt has a legal guardian.    PT Comments    Pt was asleep in long sitting. He is A and O and agreeable to session. Was able to exit bed but has heavy use of bed rails and bed functions. Pt was able to proceed to standing and ambulating without AD but continues to present with unsteadiness. Due to pt's clavicle fx, elected not to use AD. At baseline, pt uses hemiwalker. A lot of readmission in past few years. DSS involved. Highly recommend 24/7 assistance/supervision for safety.   Recommendations for follow up therapy are one component of a multi-disciplinary discharge planning process, led by the attending physician.  Recommendations may be updated based on patient status, additional functional criteria and insurance authorization.  Follow Up Recommendations  Follow physician's recommendations for discharge plan and follow up therapies     Assistance Recommended at Discharge Set up Supervision/Assistance  Patient can return home with the following Assist for transportation;A little help with bathing/dressing/bathroom;A little help with walking and/or transfers;Assistance with cooking/housework   Equipment Recommendations  None recommended by PT       Precautions / Restrictions Precautions Precautions: Fall Restrictions Weight Bearing Restrictions: No LUE Weight Bearing: Weight bearing as tolerated     Mobility  Bed Mobility Overal bed mobility: Modified Independent Bed Mobility: Supine to Sit, Sit to Supine     Supine to sit:  Supervision Sit to supine: Supervision   Transfers Overall transfer level: Needs assistance Equipment used: None Transfers: Sit to/from Stand Sit to Stand: Min assist     Ambulation/Gait Ambulation/Gait assistance: Min guard Gait Distance (Feet): 200 Feet Assistive device: None Gait Pattern/deviations: Step-through pattern, Shuffle, Staggering left, Staggering right, Drifts right/left Gait velocity: decreased     General Gait Details: pt continues to be able to ambulate without AD however is unsteady. [name] baseline ambulation is unsteady but able to use hemiwalker. Due to clavicle fx, elected not to use hemiwalker     Balance Overall balance assessment: History of Falls, Needs assistance Sitting-balance support: No upper extremity supported, Feet unsupported Sitting balance-Leahy Scale: Good     Standing balance support: Bilateral upper extremity supported, During functional activity Standing balance-Leahy Scale: Fair       Cognition Arousal/Alertness: Awake/alert Behavior During Therapy: WFL for tasks assessed/performed Overall Cognitive Status: Within Functional Limits for tasks assessed      Following Commands: Follows one step commands consistently, Follows one step commands with increased time Safety/Judgement: Decreased awareness of safety, Decreased awareness of deficits     General Comments: Pt A and O x 4           General Comments General comments (skin integrity, edema, etc.): reviewed fall prevention and need to use call bell when he wants to get up. " I wnt to walk more often."      Pertinent Vitals/Pain Pain Assessment Pain Assessment: 0-10 Pain Score: 3  Faces Pain Scale: Hurts a little bit Breathing: normal Negative Vocalization: none Pain Location: L shoulder/collar bone Pain Descriptors / Indicators: Aching Pain Intervention(s): Limited activity within patient's tolerance,  Monitored during session, Premedicated before session, Repositioned      PT Goals (current goals can now be found in the care plan section) Acute Rehab PT Goals Patient Stated Goal: get my home back Progress towards PT goals: Progressing toward goals    Frequency    Min 2X/week      PT Plan Current plan remains appropriate       AM-PAC PT "6 Clicks" Mobility   Outcome Measure  Help needed turning from your back to your side while in a flat bed without using bedrails?: None Help needed moving from lying on your back to sitting on the side of a flat bed without using bedrails?: A Little Help needed moving to and from a bed to a chair (including a wheelchair)?: A Little Help needed standing up from a chair using your arms (e.g., wheelchair or bedside chair)?: A Little Help needed to walk in hospital room?: A Little Help needed climbing 3-5 steps with a railing? : A Little 6 Click Score: 19    End of Session Equipment Utilized During Treatment: Gait belt Activity Tolerance: Patient tolerated treatment well Patient left: in chair;with call bell/phone within reach;with chair alarm set Nurse Communication: Mobility status PT Visit Diagnosis: Unsteadiness on feet (R26.81);History of falling (Z91.81);Muscle weakness (generalized) (M62.81) Pain - Right/Left: Left Pain - part of body: Shoulder     Time: 7416-3845 PT Time Calculation (min) (ACUTE ONLY): 12 min  Charges:  $Gait Training: 8-22 mins                     Julaine Fusi PTA 03/26/22, 8:44 AM

## 2022-03-27 DIAGNOSIS — S42025A Nondisplaced fracture of shaft of left clavicle, initial encounter for closed fracture: Secondary | ICD-10-CM | POA: Diagnosis not present

## 2022-03-27 DIAGNOSIS — F1020 Alcohol dependence, uncomplicated: Secondary | ICD-10-CM | POA: Diagnosis not present

## 2022-03-27 DIAGNOSIS — I480 Paroxysmal atrial fibrillation: Secondary | ICD-10-CM | POA: Diagnosis not present

## 2022-03-27 NOTE — Plan of Care (Signed)
  Problem: Education: Goal: Knowledge of General Education information will improve Description: Including pain rating scale, medication(s)/side effects and non-pharmacologic comfort measures 03/27/2022 0406 by Suzette Battiest, LPN Outcome: Progressing   Problem: Health Behavior/Discharge Planning: Goal: Ability to manage health-related needs will improve 03/27/2022 0406 by Suzette Battiest, LPN Outcome: Progressing   Problem: Clinical Measurements: Goal: Ability to maintain clinical measurements within normal limits will improve 03/27/2022 0406 by Suzette Battiest, LPN Outcome: Progressing   Problem: Clinical Measurements: Goal: Will remain free from infection 03/26/2022 2312 by Suzette Battiest, LPN Outcome: Progressing   Problem: Clinical Measurements: Goal: Diagnostic test results will improve 03/27/2022 0406 by Suzette Battiest, LPN Outcome: Progressing   Problem: Clinical Measurements: Goal: Respiratory complications will improve 03/26/2022 2312 by Suzette Battiest, LPN Outcome: Progressing   Problem: Clinical Measurements: Goal: Cardiovascular complication will be avoided 03/26/2022 2312 by Suzette Battiest, LPN Outcome: Progressing   Problem: Activity: Goal: Risk for activity intolerance will decrease 03/27/2022 0406 by Suzette Battiest, LPN Outcome: Progressing   Problem: Nutrition: Goal: Adequate nutrition will be maintained 03/27/2022 0406 by Suzette Battiest, LPN Outcome: Progressing 03/26/2022 2312 by Suzette Battiest, LPN Outcome: Progressing   Problem: Nutrition: Goal: Adequate nutrition will be maintained 03/26/2022 2312 by Suzette Battiest, LPN Outcome: Progressing   Problem: Coping: Goal: Level of anxiety will decrease 03/27/2022 0406 by Suzette Battiest, LPN Outcome: Progressing   Problem: Coping: Goal: Level of anxiety will decrease 03/26/2022 2312 by Suzette Battiest, LPN Outcome: Progressing

## 2022-03-27 NOTE — Plan of Care (Signed)
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
## 2022-03-27 NOTE — Progress Notes (Signed)
Nutrition Brief Note  Patient identified on the Malnutrition Screening Tool (MST) Report  Wt Readings from Last 15 Encounters:  03/26/22 57.7 kg  03/13/22 64.6 kg  02/19/22 64.6 kg  02/14/22 63.3 kg  11/27/21 54.4 kg  09/19/21 54.4 kg  08/13/21 54.8 kg  09/28/20 54 kg  09/04/20 54.5 kg  08/18/20 54.4 kg  08/06/20 55.7 kg  07/07/20 63.5 kg  07/06/20 63.5 kg  07/05/20 68.5 kg  06/30/20 68.5 kg   Pt with chronic alcohol use with numerous hospitalizations for alcoholic encephalopathy and alcohol withdrawal.  Also has a history of paroxysmal atrial fibrillation, hypertension, severe protein calorie malnutrition.  He presented after a fall and found to have a left clavicle fracture.  Pt admitted with lt clavicle fracture s/p fall and alcohol use disorder.   Case discussed with RN, pt tolerating meals and meds well. Pt consumed 100% of breakfast this morning.   Spoke with pt, who was sitting in recliner chair at time of visit. Pt reports feeling well and sleeping well last night, but states "they've been working me hard". Pt reports good appetite PTA, cosnuming 2 meals per day (Breakfast: eggs, grits, orange juice, and veggie sausage; Lunch: veggie burger and vegetables and salmon patties from [employer]). Pt enjoys hospital food and is consuming 100% of meals.  Pt denies any weight loss. He thinks he has lost weight because he stopped working out at [employer] about 6 months ago (pt used to go everyday to use the elliptical and standing weights). Reviewed wt hx; wt has been stable over the past 6 months.  Discussed importance of good meal intake to promote healing. Pt with no further questions.   Nutrition-Focused physical exam completed. Findings are no fat depletion, no muscle depletion, and no edema.     Medications reviewed and thiamine, folic acid, and MVI.   Current diet order is Heart Healthy (will liberalize diet for wider variety of meals selections), patient is  consuming approximately 100% of meals at this time. Labs and medications reviewed.   No nutrition interventions warranted at this time. If nutrition issues arise, please consult RD.   Loistine Chance, RD, LDN, [place] Registered Dietitian II Certified Diabetes Care and Education Specialist Please refer to [facility] for RD and/or RD on-call/weekend/after hours pager

## 2022-03-27 NOTE — Progress Notes (Signed)
Physical Therapy Treatment Patient Details Name: Douglas No. MRN: [id number] DOB: 11-04-1950 Today's Date: 03/27/2022   History of Present Illness 70yoM here last week after fall at home with bilat hip pain, dry mouth, sore throat, he was discharged home and now returns after another fall at home, now with L clavicular fx . PMH: ETOH abuse with frequent aqdmissions in 2022 & 2021, AF, HTN, remote brain injury with chronic spastic hemiplegia. Pt has a legal guardian.    PT Comments    Pt was sitting in recliner upon arriving. He is A and O and agreeable to session. He states his personal lawyer just left. Pt is motivated and cooperative throughout." I just want to go home." Pt was able to stand and ambulate without AD 400 ft. Did have 2 occasions of LOB with author intervention to prevent fall. DSS is involved in his case. Acute PT will continue to follow and progress as able per current POC.    Recommendations for follow up therapy are one component of a multi-disciplinary discharge planning process, led by the attending physician.  Recommendations may be updated based on patient status, additional functional criteria and insurance authorization.  Follow Up Recommendations  Follow physician's recommendations for discharge plan and follow up therapies     Assistance Recommended at Discharge Set up Supervision/Assistance  Patient can return home with the following Assist for transportation;A little help with bathing/dressing/bathroom;A little help with walking and/or transfers;Assistance with cooking/housework   Equipment Recommendations  None recommended by PT       Precautions / Restrictions Precautions Precautions: Fall Restrictions Weight Bearing Restrictions: Yes LUE Weight Bearing: Weight bearing as tolerated     Mobility  Bed Mobility  General bed mobility comments: In recliner pre/post session    Transfers Overall transfer level: Needs assistance Equipment used:  None Transfers: Sit to/from Stand Sit to Stand: Min guard  General transfer comment: CGA for safety    Ambulation/Gait Ambulation/Gait assistance: Min guard Gait Distance (Feet): 400 Feet Assistive device: None Gait Pattern/deviations: Step-through pattern, Shuffle, Staggering left, Staggering right, Drifts right/left Gait velocity: decreased   General Gait Details: pt ambulated 2 laps without AD. He had 2 occasions of LOB with author min assist to prevent fall. will reach out to MD about wt bearing on LUE (clavicle fx)    Balance Overall balance assessment: History of Falls, Needs assistance Sitting-balance support: No upper extremity supported, Feet unsupported Sitting balance-Leahy Scale: Good     Standing balance support: During functional activity, No upper extremity supported Standing balance-Leahy Scale: Poor       Cognition Arousal/Alertness: Awake/alert Behavior During Therapy: WFL for tasks assessed/performed Overall Cognitive Status: Within Functional Limits for tasks assessed      General Comments: Pt is A and O x 4. motivated and pleasant. Seems to be back to baseline cognition           General Comments General comments (skin integrity, edema, etc.): educated on posture correction and need for increased physical activity throughout the day      Pertinent Vitals/Pain Pain Assessment Pain Assessment: No/denies pain Pain Score: 0-No pain     PT Goals (current goals can now be found in the care plan section) Acute Rehab PT Goals Patient Stated Goal: go home and get DSS off my back Progress towards PT goals: Progressing toward goals    Frequency    Min 2X/week      PT Plan Current plan remains appropriate  AM-PAC PT "6 Clicks" Mobility   Outcome Measure  Help needed turning from your back to your side while in a flat bed without using bedrails?: None Help needed moving from lying on your back to sitting on the side of a flat bed without  using bedrails?: A Little Help needed moving to and from a bed to a chair (including a wheelchair)?: A Little Help needed standing up from a chair using your arms (e.g., wheelchair or bedside chair)?: A Little Help needed to walk in hospital room?: A Little Help needed climbing 3-5 steps with a railing? : A Little 6 Click Score: 19    End of Session   Activity Tolerance: Patient tolerated treatment well Patient left: in chair;with call bell/phone within reach;with chair alarm set Nurse Communication: Mobility status PT Visit Diagnosis: Unsteadiness on feet (R26.81);History of falling (Z91.81);Muscle weakness (generalized) (M62.81) Pain - Right/Left: Left Pain - part of body: Shoulder     Time: 4401-0272 PT Time Calculation (min) (ACUTE ONLY): 23 min  Charges:  $Gait Training: 8-22 mins $Therapeutic Activity: 8-22 mins                     Julaine Fusi PTA 03/27/22, 12:26 PM

## 2022-03-28 DIAGNOSIS — S42025A Nondisplaced fracture of shaft of left clavicle, initial encounter for closed fracture: Secondary | ICD-10-CM | POA: Diagnosis not present

## 2022-03-28 DIAGNOSIS — F1020 Alcohol dependence, uncomplicated: Secondary | ICD-10-CM | POA: Diagnosis not present

## 2022-03-28 LAB — BASIC METABOLIC PANEL
Anion gap: 7 (ref 5–15)
BUN: 18 mg/dL (ref 8–23)
CO2: 28 mmol/L (ref 22–32)
Calcium: 9.1 mg/dL (ref 8.9–10.3)
Chloride: 103 mmol/L (ref 98–111)
Creatinine, Ser: 0.62 mg/dL (ref 0.61–1.24)
GFR, Estimated: >60 mL/min (ref >60)
Glucose, Bld: 90 mg/dL (ref 70–99)
Potassium: 3.8 mmol/L (ref 3.5–5.1)
Sodium: 138 mmol/L (ref 135–145)

## 2022-03-28 LAB — PHOSPHORUS: Phosphorus: 4.2 mg/dL (ref 2.5–4.6)

## 2022-03-28 LAB — MAGNESIUM: Magnesium: 2.1 mg/dL (ref 1.7–2.4)

## 2022-03-28 MED ORDER — PANTOPRAZOLE SODIUM 40 MG PO TBEC
40.0000 mg | DELAYED_RELEASE_TABLET | Freq: Two times a day (BID) | ORAL | Status: DC
  Administered 2022-03-28 – 2022-04-10 (×26): 40 mg via ORAL
  Filled 2022-03-28 (×26): qty 1

## 2022-03-28 NOTE — Plan of Care (Signed)

## 2022-03-28 NOTE — Progress Notes (Signed)
Physical Therapy Treatment Patient Details Name: Douglas Peterson. MRN: [id number] DOB: [birth year] Today's Date: 03/28/2022   History of Present Illness 70yoM here last week after fall at home with bilat hip pain, dry mouth, sore throat, he was discharged home and now returns after another fall at home, now with L clavicular fx . PMH: ETOH abuse with frequent aqdmissions in 2022 & 2021, AF, HTN, remote brain injury with chronic spastic hemiplegia. Pt has a legal guardian.    PT Comments    Pt was long sitting in bed upon arriving. He agrees to session and was cooperative throughout. Request to go outside for some fresh air. Was able to exit bed, stand, and ambulate with very little assistance. He ambulated ~ 300 ft prior to sitting in w/c and going outside. While sitting outside, perform ROM/strengthening in all extremities. Pt is eager to get home however DSS is involved. Follow Mds recs for DC disposition.    Recommendations for follow up therapy are one component of a multi-disciplinary discharge planning process, led by the attending physician.  Recommendations may be updated based on patient status, additional functional criteria and insurance authorization.  Follow Up Recommendations  Follow physician's recommendations for discharge plan and follow up therapies     Assistance Recommended at Discharge Set up Supervision/Assistance  Patient can return home with the following Assist for transportation;A little help with bathing/dressing/bathroom;A little help with walking and/or transfers;Assistance with cooking/housework   Equipment Recommendations  None recommended by PT       Precautions / Restrictions Precautions Precautions: Fall Restrictions Weight Bearing Restrictions: Yes LUE Weight Bearing: Weight bearing as tolerated     Mobility  Bed Mobility Overal bed mobility: Modified Independent Bed Mobility: Supine to Sit, Sit to Supine  Supine to sit: Supervision    Transfers Overall transfer level: Needs assistance Equipment used: None Transfers: Sit to/from Stand Sit to Stand: Supervision  General transfer comment: Pt did not require assistance to stand however did require vcs for improved technique for additional safety    Ambulation/Gait Ambulation/Gait assistance: Min guard, Supervision Gait Distance (Feet): 275 Feet Assistive device: None Gait Pattern/deviations: Step-through pattern, Shuffle, Staggering left, Staggering right, Drifts right/left Gait velocity: decreased     General Gait Details: pt was able to ambuklate 275 ft without AD. Vcs for posture correction throughout. 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 may attempt use of AFO to see if it improves gait safety next session.     Balance Overall balance assessment: History of Falls, Needs assistance Sitting-balance support: No upper extremity supported, Feet unsupported Sitting balance-Leahy Scale: Good     Standing balance support: During functional activity, No upper extremity supported Standing balance-Leahy Scale: Fair       Cognition Arousal/Alertness: Awake/alert Behavior During Therapy: WFL for tasks assessed/performed Overall Cognitive Status: Within Functional Limits for tasks assessed      General Comments: Pt is A and O and agreeable to session. He was eager to get up and ambulate           General Comments General comments (skin integrity, edema, etc.): Pt was requesting to go outside. After ambulating > 200 ft he was placed in w/c and self propelled outside. While sitting outside. Chief Strategy Officer and pt worked on improving ROM and strength in all extremeities. He tolerated well.      Pertinent Vitals/Pain Pain Assessment Pain Assessment: No/denies pain Pain Score: 0-No pain Pain Intervention(s): Limited activity within patient's tolerance, Monitored during session,  Premedicated before session     PT Goals (current goals can  now be found in the care plan section) Acute Rehab PT Goals Patient Stated Goal: go home and get DSS off my back Progress towards PT goals: Progressing toward goals    Frequency    Min 2X/week      PT Plan Current plan remains appropriate       AM-PAC PT "6 Clicks" Mobility   Outcome Measure  Help needed turning from your back to your side while in a flat bed without using bedrails?: None Help needed moving from lying on your back to sitting on the side of a flat bed without using bedrails?: A Little Help needed moving to and from a bed to a chair (including a wheelchair)?: A Little Help needed standing up from a chair using your arms (e.g., wheelchair or bedside chair)?: A Little Help needed to walk in hospital room?: A Little Help needed climbing 3-5 steps with a railing? : A Little 6 Click Score: 19    End of Session   Activity Tolerance: Patient tolerated treatment well Patient left: in chair;with call bell/phone within reach;with chair alarm set Nurse Communication: Mobility status PT Visit Diagnosis: Unsteadiness on feet (R26.81);History of falling (Z91.81);Muscle weakness (generalized) (M62.81) Pain - Right/Left: Left Pain - part of body: Shoulder     Time: 9191-6606 PT Time Calculation (min) (ACUTE ONLY): 17 min  Charges:  $Gait Training: 8-22 mins                    Julaine Fusi PTA 03/28/22, 1:16 PM

## 2022-03-28 NOTE — Plan of Care (Signed)

## 2022-03-28 NOTE — TOC Progression Note (Addendum)
Transition of Care (TOC) - Progression Note    Patient Details  Name: Douglas Peterson. MRN: [id number] Date of Birth: 27-Oct-1951  Transition of Care [facility]) CM/SW [place], LCSW Phone Number: 03/28/2022, 9:27 AM  Clinical Narrative:   CSW called and left VM for [facility] with DSS requesting update.   1:50- Notified by RN that patient is asking for an update from APS. CSW attempted call to [facility] with DSS again. Left another VM.    Expected Discharge Plan: [place] Barriers to Discharge: Continued Medical Work up  Expected Discharge Plan and Services Expected Discharge Plan: [place]   Discharge Planning Services: CM Consult Post Acute Care Choice: [place] arrangements for the past 2 months: Single Family Home Expected Discharge Date: 03/21/22               DME Arranged: N/A DME Agency: NA       HH Arranged: PT, OT HH Agency: [place] Date HH Agency Contacted: 03/18/22 Time [place]: [place] Representative spoke with at [place]: [place] ([place]) Interventions    Readmission Risk Interventions    03/14/2022   12:48 PM 08/23/2020   11:52 AM 08/19/2020    1:32 PM  Readmission Risk Prevention Plan  Transportation Screening Complete Complete Complete  PCP or Specialist Appt within 3-5 Days Complete    HRI or [place] Complete    Social Work Consult for [place] Planning/Counseling Complete    Palliative Care Screening Not Applicable    Medication Review Press photographer) Complete Complete Complete  PCP or Specialist appointment within 3-5 days of discharge   Complete  HRI or [place]  Complete   SW Recovery Care/Counseling Consult   Complete  [place]  Not Applicable

## 2022-03-28 NOTE — Progress Notes (Addendum)
Mobility Specialist - Progress Note   03/28/22 0900  Mobility  Activity Ambulated with assistance in hallway;Transferred from bed to chair  Level of Assistance Standby assist, set-up cues, supervision of patient - no hands on  Assistive Device None  Distance Ambulated (ft) 370 ft  Activity Response Tolerated well  $Mobility charge 1 Mobility     Pt lying in bed upon arrival, utilizing RA. Pt ambulated to bathroom prior to continuation in hallway, no urinal output however. Mild soreness in L shoulder. No other complaints. Unsteady with 1 LOB during activity, corrected by CGA. Pt returned to chair with alarm set, needs in reach.    Kathee Delton Mobility Specialist 03/28/22, 10:12 AM
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
## 2022-03-29 DIAGNOSIS — F1020 Alcohol dependence, uncomplicated: Secondary | ICD-10-CM | POA: Diagnosis not present

## 2022-03-29 DIAGNOSIS — S42025A Nondisplaced fracture of shaft of left clavicle, initial encounter for closed fracture: Secondary | ICD-10-CM | POA: Diagnosis not present

## 2022-03-29 LAB — CBC
HCT: 41.4 % (ref 39.0–52.0)
Hemoglobin: 13.3 g/dL (ref 13.0–17.0)
MCH: 31.1 pg (ref 26.0–34.0)
MCHC: 32.1 g/dL (ref 30.0–36.0)
MCV: 97 fL (ref 80.0–100.0)
Platelets: 263 10*3/uL (ref 150–400)
RBC: 4.27 MIL/uL (ref 4.22–5.81)
RDW: 13.2 % (ref 11.5–15.5)
WBC: 6.7 10*3/uL (ref 4.0–10.5)
nRBC: 0 % (ref 0.0–0.2)
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
## 2022-03-30 DIAGNOSIS — F1020 Alcohol dependence, uncomplicated: Secondary | ICD-10-CM | POA: Diagnosis not present

## 2022-03-30 DIAGNOSIS — S42025A Nondisplaced fracture of shaft of left clavicle, initial encounter for closed fracture: Secondary | ICD-10-CM | POA: Diagnosis not present
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
## 2022-03-31 DIAGNOSIS — S42025A Nondisplaced fracture of shaft of left clavicle, initial encounter for closed fracture: Secondary | ICD-10-CM | POA: Diagnosis not present

## 2022-03-31 DIAGNOSIS — F1020 Alcohol dependence, uncomplicated: Secondary | ICD-10-CM | POA: Diagnosis not present

## 2022-03-31 MED ORDER — LIDOCAINE 5 % EX OINT
TOPICAL_OINTMENT | Freq: Three times a day (TID) | CUTANEOUS | Status: DC
  Administered 2022-04-08 – 2022-04-09 (×3): 1 via TOPICAL
  Filled 2022-03-31 (×2): qty 35.44

## 2022-03-31 NOTE — Progress Notes (Signed)
       CROSS COVER NOTE  NAME: Douglas Peterson. MRN: [id number] DOB : 1950/12/01   Secure chat received from pharmacy "hi, this pt has been ordered Lidocaine 4% cream TID .[name]. we are currently out of this product ... we do have lidocaine 5% ointment available ... can I switch to this product ? "  Lidocaine 5% Ointment ordered.  Neomia Glass DNP, MHA, FNP-BC Nurse Practitioner [facility] Pager [phone]
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
## 2022-03-31 NOTE — Progress Notes (Signed)
Mobility Specialist - Progress Note   03/31/22 1500  Mobility  Activity Refused mobility     Pt sleeping on arrival and politely declines OOB activity at this time; requests to return at a later/date time. Will attempt another date.    Kathee Delton Mobility Specialist 03/31/22, 3:05 PM

## 2022-03-31 NOTE — Plan of Care (Signed)
  Problem: Activity: Goal: Risk for activity intolerance will decrease Outcome: Progressing   Problem: Nutrition: Goal: Adequate nutrition will be maintained Outcome: Progressing   Problem: Elimination: Goal: Will not experience complications related to urinary retention Outcome: Progressing   

## 2022-04-01 DIAGNOSIS — F1020 Alcohol dependence, uncomplicated: Secondary | ICD-10-CM | POA: Diagnosis not present

## 2022-04-01 DIAGNOSIS — S42025A Nondisplaced fracture of shaft of left clavicle, initial encounter for closed fracture: Secondary | ICD-10-CM | POA: Diagnosis not present

## 2022-04-01 LAB — CBC
HCT: 41 % (ref 39.0–52.0)
Hemoglobin: 13.6 g/dL (ref 13.0–17.0)
MCH: 32 pg (ref 26.0–34.0)
MCHC: 33.2 g/dL (ref 30.0–36.0)
MCV: 96.5 fL (ref 80.0–100.0)
Platelets: 269 10*3/uL (ref 150–400)
RBC: 4.25 MIL/uL (ref 4.22–5.81)
RDW: 12.9 % (ref 11.5–15.5)
WBC: 6.8 10*3/uL (ref 4.0–10.5)
nRBC: 0 % (ref 0.0–0.2)

## 2022-04-01 MED ORDER — ATENOLOL 25 MG PO TABS
25.0000 mg | ORAL_TABLET | Freq: Every day | ORAL | Status: DC
  Administered 2022-04-02 – 2022-04-10 (×9): 25 mg via ORAL
  Filled 2022-04-01 (×9): qty 1

## 2022-04-01 NOTE — Plan of Care (Signed)
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
## 2022-04-01 NOTE — Plan of Care (Signed)
  Problem: Nutrition: Goal: Adequate nutrition will be maintained Outcome: Progressing   Problem: Pain Managment: Goal: General experience of comfort will improve Outcome: Progressing   Problem: Safety: Goal: Ability to remain free from injury will improve Outcome: Progressing   

## 2022-04-01 NOTE — Progress Notes (Signed)
Physical Therapy Treatment Patient Details Name: Douglas Peterson. MRN: [id number] DOB: [birth year] Today's Date: 04/01/2022   History of Present Illness 70yoM here last week after fall at home with bilat hip pain, dry mouth, sore throat, he was discharged home and now returns after another fall at home, now with L clavicular fx . PMH: ETOH abuse with frequent aqdmissions in 2022 & 2021, AF, HTN, remote brain injury with chronic spastic hemiplegia. Pt has a legal guardian.    PT Comments    Pt alert, agreeable to PT, reported 5/10 L clavicle pain but declining informing RN at this time stating "all I get is tylenol". He was sitting EOB, and was able to don shoes modI. Sit <> stand with supervision, and 1 LOB noted in room, pt able to correct with minA, another bout of LOB in hallway, CGA to correct. Pt with continued gait deficits due to R hemiplegia, some potential clonus noted with fatigue. Pt returned to room with all needs in reach at end of session. The patient would benefit from further skilled PT intervention to continue to progress towards goals. Recommendation remains appropriate.      Recommendations for follow up therapy are one component of a multi-disciplinary discharge planning process, led by the attending physician.  Recommendations may be updated based on patient status, additional functional criteria and insurance authorization.  Follow Up Recommendations  Follow physician's recommendations for discharge plan and follow up therapies     Assistance Recommended at Discharge Set up Supervision/Assistance  Patient can return home with the following Assist for transportation;A little help with bathing/dressing/bathroom;A little help with walking and/or transfers;Assistance with cooking/housework   Equipment Recommendations  None recommended by PT    Recommendations for Other Services       Precautions / Restrictions Precautions Precautions: Fall Restrictions Weight Bearing  Restrictions: Yes LUE Weight Bearing: Weight bearing as tolerated     Mobility  Bed Mobility               General bed mobility comments: sitting EOB at start of session    Transfers Overall transfer level: Needs assistance Equipment used: None Transfers: Sit to/from Stand Sit to Stand: Supervision                Ambulation/Gait Ambulation/Gait assistance: Min guard, Supervision, Min assist Gait Distance (Feet): 280 Feet   Gait Pattern/deviations: Step-through pattern, Shuffle, Staggering left, Staggering right, Drifts right/left Gait velocity: decreased     General Gait Details: Pt does ambulate without AD, but did have 1-2 LOB that required light minA-CGA to correct.   Stairs             Wheelchair Mobility    Modified Rankin (Stroke Patients Only)       Balance Overall balance assessment: Needs assistance Sitting-balance support: No upper extremity supported, Feet unsupported Sitting balance-Leahy Scale: Good Sitting balance - Comments: reach to feet to don shoes     Standing balance-Leahy Scale: Fair                              Cognition Arousal/Alertness: Awake/alert Behavior During Therapy: WFL for tasks assessed/performed Overall Cognitive Status: Within Functional Limits for tasks assessed                                 General Comments: Pt is A and O and agreeable to session.  He was eager to get up and ambulate        Exercises      General Comments        Pertinent Vitals/Pain Pain Assessment Pain Assessment: 0-10 Pain Score: 5  Pain Location: L shoulder/collar bone Pain Descriptors / Indicators: Aching Pain Intervention(s): Limited activity within patient's tolerance, Monitored during session    Home Living                          Prior Function            PT Goals (current goals can now be found in the care plan section) Acute Rehab PT Goals Time For Goal Achievement:  04/12/22 Progress towards PT goals: Progressing toward goals    Frequency    Min 2X/week      PT Plan Current plan remains appropriate    Co-evaluation              AM-PAC PT "6 Clicks" Mobility   Outcome Measure  Help needed turning from your back to your side while in a flat bed without using bedrails?: None Help needed moving from lying on your back to sitting on the side of a flat bed without using bedrails?: A Little Help needed moving to and from a bed to a chair (including a wheelchair)?: A Little Help needed standing up from a chair using your arms (e.g., wheelchair or bedside chair)?: A Little Help needed to walk in hospital room?: A Little Help needed climbing 3-5 steps with a railing? : A Little 6 Click Score: 19    End of Session Equipment Utilized During Treatment: Gait belt Activity Tolerance: Patient tolerated treatment well Patient left: in chair;with call bell/phone within reach;with chair alarm set Nurse Communication: Mobility status PT Visit Diagnosis: Unsteadiness on feet (R26.81);History of falling (Z91.81);Muscle weakness (generalized) (M62.81) Pain - Right/Left: Left Pain - part of body: Shoulder     Time: 2706-2376 PT Time Calculation (min) (ACUTE ONLY): 17 min  Charges:  $Therapeutic Activity: 8-22 mins                     Lieutenant Diego PT, DPT 1:32 PM,04/01/22

## 2022-04-02 DIAGNOSIS — S42025A Nondisplaced fracture of shaft of left clavicle, initial encounter for closed fracture: Secondary | ICD-10-CM | POA: Diagnosis not present

## 2022-04-02 DIAGNOSIS — F1020 Alcohol dependence, uncomplicated: Secondary | ICD-10-CM | POA: Diagnosis not present

## 2022-04-02 MED ORDER — THIAMINE HCL 100 MG PO TABS
100.0000 mg | ORAL_TABLET | Freq: Every day | ORAL | Status: DC
  Administered 2022-04-03 – 2022-04-10 (×8): 100 mg via ORAL
  Filled 2022-04-02 (×8): qty 1
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
## 2022-04-02 NOTE — TOC Progression Note (Signed)
Transition of Care (TOC) - Progression Note    Patient Details  Name: Douglas Peterson. MRN: [id number] Date of Birth: 07/30/1951  Transition of Care [facility]) CM/SW [place], RN Phone Number: 04/02/2022, 9:03 AM  Clinical Narrative:    Sent a secure Email to Barrie Lyme at [place] asking for an update   Expected Discharge Plan: [place] Barriers to Discharge: Continued Medical Work up  Expected Discharge Plan and Services Expected Discharge Plan: [place]   Discharge Planning Services: CM Consult Post Acute Care Choice: [place] arrangements for the past 2 months: Single Family Home Expected Discharge Date: 03/21/22               DME Arranged: N/A DME Agency: NA       HH Arranged: PT, OT HH Agency: [place] Date HH Agency Contacted: 03/18/22 Time [place]: [id number] Representative spoke with at [place]: [place] ([place]) Interventions    Readmission Risk Interventions    03/14/2022   12:48 PM 08/23/2020   11:52 AM 08/19/2020    1:32 PM  Readmission Risk Prevention Plan  Transportation Screening Complete Complete Complete  PCP or Specialist Appt within 3-5 Days Complete    HRI or [place] Complete    Social Work Consult for [place] Planning/Counseling Complete    Palliative Care Screening Not Applicable    Medication Review Press photographer) Complete Complete Complete  PCP or Specialist appointment within 3-5 days of discharge   Complete  HRI or [place]  Complete   SW Recovery Care/Counseling Consult   Complete  [place]  Not Applicable

## 2022-04-02 NOTE — Progress Notes (Signed)
Physical Therapy Treatment Patient Details Name: Douglas Peterson. MRN: [id number] DOB: Apr 01, 1951 Today's Date: 04/02/2022   History of Present Illness 70yoM here last week after fall at home with bilat hip pain, dry mouth, sore throat, he was discharged home and now returns after another fall at home, now with L clavicular fx . PMH: ETOH abuse with frequent aqdmissions in 2022 & 2021, AF, HTN, remote brain injury with chronic spastic hemiplegia. Pt has a legal guardian.    PT Comments    Patient received in recliner, he is agreeable to PT session. Patient required min guard/A for sit to stand from recliner. He is able to ambulate > 300 feet with min guard/min A. 1-2 minor instabilities during ambulation requiring min A. He has decreased safety and strength with increased distance. Patient will continue to benefit from skilled PT while here to maximize functional independence, strength and safety.      Recommendations for follow up therapy are one component of a multi-disciplinary discharge planning process, led by the attending physician.  Recommendations may be updated based on patient status, additional functional criteria and insurance authorization.  Follow Up Recommendations  Follow physician's recommendations for discharge plan and follow up therapies     Assistance Recommended at Discharge Intermittent Supervision/Assistance  Patient can return home with the following Assist for transportation;A little help with bathing/dressing/bathroom;A little help with walking and/or transfers;Assistance with cooking/housework;Help with stairs or ramp for entrance   Equipment Recommendations  None recommended by PT    Recommendations for Other Services       Precautions / Restrictions Precautions Precautions: Fall Restrictions Weight Bearing Restrictions: Yes LUE Weight Bearing: Weight bearing as tolerated     Mobility  Bed Mobility               General bed mobility comments:  seated in recliner and remained in recliner    Transfers Overall transfer level: Needs assistance Equipment used: None Transfers: Sit to/from Stand Sit to Stand: Min guard           General transfer comment: required a couple of attempts to get powered up to standing    Ambulation/Gait Ambulation/Gait assistance: Min guard, Min assist Gait Distance (Feet): 325 Feet Assistive device: None Gait Pattern/deviations: Step-through pattern, Decreased step length - right, Decreased step length - left, Decreased stride length, Drifts right/left Gait velocity: decreased     General Gait Details: 1-2 minor instabilities with gait, requiring min A to steady, patient demonstrates leg fatigue with increased distance. Shaking/clonus noted   Marine scientist Rankin (Stroke Patients Only)       Balance Overall balance assessment: Needs assistance, History of Falls Sitting-balance support: Feet supported Sitting balance-Leahy Scale: Good     Standing balance support: No upper extremity supported, During functional activity Standing balance-Leahy Scale: Fair                              Cognition Arousal/Alertness: Awake/alert Behavior During Therapy: WFL for tasks assessed/performed Overall Cognitive Status: Within Functional Limits for tasks assessed Area of Impairment: Safety/judgement, Awareness                         Safety/Judgement: Decreased awareness of safety, Decreased awareness of deficits Awareness: Intellectual   General Comments: patient very pleasant and eager to ambulate  Exercises      General Comments        Pertinent Vitals/Pain Pain Assessment Pain Assessment: Faces Faces Pain Scale: Hurts a little bit Breathing: normal Negative Vocalization: none Facial Expression: smiling or inexpressive Body Language: relaxed Consolability: no need to console PAINAD Score: 0 Pain  Location: L shoulder/collar bone Pain Descriptors / Indicators: Discomfort, Sore Pain Intervention(s): Monitored during session    Home Living                          Prior Function            PT Goals (current goals can now be found in the care plan section) Acute Rehab PT Goals Patient Stated Goal: go home PT Goal Formulation: With patient Time For Goal Achievement: 04/12/22 Potential to Achieve Goals: Good Progress towards PT goals: Progressing toward goals    Frequency    Min 2X/week      PT Plan Current plan remains appropriate    Co-evaluation              AM-PAC PT "6 Clicks" Mobility   Outcome Measure  Help needed turning from your back to your side while in a flat bed without using bedrails?: None Help needed moving from lying on your back to sitting on the side of a flat bed without using bedrails?: None Help needed moving to and from a bed to a chair (including a wheelchair)?: A Little Help needed standing up from a chair using your arms (e.g., wheelchair or bedside chair)?: A Little Help needed to walk in hospital room?: A Little Help needed climbing 3-5 steps with a railing? : A Lot 6 Click Score: 19    End of Session Equipment Utilized During Treatment: Gait belt Activity Tolerance: Patient tolerated treatment well Patient left: in chair;with call bell/phone within reach;with chair alarm set Nurse Communication: Mobility status PT Visit Diagnosis: Unsteadiness on feet (R26.81);Muscle weakness (generalized) (M62.81);Repeated falls (R29.6) Pain - Right/Left: Left Pain - part of body: Shoulder     Time: 1351-1403 PT Time Calculation (min) (ACUTE ONLY): 12 min  Charges:  $Gait Training: 8-22 mins                      , PT, [facility]CS 04/02/22,2:16 PM

## 2022-04-02 NOTE — Progress Notes (Signed)
Mobility Specialist - Progress Note   04/02/22 1034  Mobility  Activity Ambulated with assistance in hallway  Level of Assistance Standby assist, set-up cues, supervision of patient - no hands on  Assistive Device None  Distance Ambulated (ft) 520 ft  Activity Response Tolerated well  $Mobility charge 1 Mobility     Pt ambulated hallway with supervision, mod shoulder pain but no other complaints.    Kathee Delton Mobility Specialist 04/02/22, 11:03 AM

## 2022-04-03 DIAGNOSIS — F1020 Alcohol dependence, uncomplicated: Secondary | ICD-10-CM | POA: Diagnosis not present

## 2022-04-03 NOTE — Progress Notes (Signed)
Patient sitting in chair. Chair alarm on, call bell in reach. Patient washed up at bedside, brushed teeth, new gown on. No complaints or needs at this time.
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
## 2022-04-03 NOTE — Progress Notes (Signed)
Mobility Specialist - Progress Note   04/03/22 1444  Mobility  Activity Ambulated with assistance in hallway  Level of Assistance Standby assist, set-up cues, supervision of patient - no hands on  Assistive Device None  Distance Ambulated (ft) 490 ft  Activity Response Tolerated well  $Mobility charge 1 Mobility     Pt sleeping on arrival, awakened to voice. Pt does voice moderate pain in shoulder. Ambulated to bathroom for urinal out prior to continuation in hallway. LOB x2 but corrected with light assist. Pt returned to bed with needs in reach, alarm set.   Kathee Delton Mobility Specialist 04/03/22, 2:46 PM

## 2022-04-03 NOTE — Progress Notes (Signed)
Physical Therapy Treatment Patient Details Name: Douglas Peterson. MRN: [id number] DOB: 27-Oct-1951 Today's Date: 04/03/2022   History of Present Illness 70yoM here last week after fall at home with bilat hip pain, dry mouth, sore throat, he was discharged home and now returns after another fall at home, now with L clavicular fx . PMH: ETOH abuse with frequent aqdmissions in 2022 & 2021, AF, HTN, remote brain injury with chronic spastic hemiplegia. Pt has a legal guardian.    PT Comments    Pt was long sitting in bed, wide awake, eager to ambulate. Author had pt trial R AFO to assist pt with having less circumduction/hip hiking during gait. Pt continues to be easily able to exit L side of bed, stand, and ambulate without AD." I think it helps a lot." Per pt referring to AFO. Will continue to closely monitor pt's abilities with use of loaner AFO prior to requesting order. Pt does well with normal ambulation however when challenged with higher lever dynamic balance exercises, he is extremely unsteady. Pt wants to DC home however DSS is involved. Acute PT will continue to follow and progress as able per current POC.    Recommendations for follow up therapy are one component of a multi-disciplinary discharge planning process, led by the attending physician.  Recommendations may be updated based on patient status, additional functional criteria and insurance authorization.  Follow Up Recommendations  Follow physician's recommendations for discharge plan and follow up therapies     Assistance Recommended at Discharge Intermittent Supervision/Assistance  Patient can return home with the following Assist for transportation;A little help with bathing/dressing/bathroom;A little help with walking and/or transfers;Assistance with cooking/housework;Help with stairs or ramp for entrance   Equipment Recommendations  None recommended by PT       Precautions / Restrictions Precautions Precautions:  Fall Restrictions Weight Bearing Restrictions: Yes LUE Weight Bearing: Weight bearing as tolerated     Mobility  Bed Mobility Overal bed mobility: Modified Independent  General bed mobility comments: no physical assistance or vcs for pt to exit L side of bed    Transfers Overall transfer level: Needs assistance Equipment used: None (R AFO) Transfers: Sit to/from Stand Sit to Stand: Supervision  General transfer comment: Pt was able to stand from EOB without AD. Required assistance to apply shoes/AFO    Ambulation/Gait Ambulation/Gait assistance: Min guard, Supervision Gait Distance (Feet): 200 Feet Assistive device: None Gait Pattern/deviations: Step-through pattern, Decreased step length - right, Decreased step length - left, Decreased stride length, Drifts right/left Gait velocity: decreased     General Gait Details: Pt did trial AFO in R shoe." I like it and think it helps." Pt does however continue to be high fall risk overall. Author had pt performing balance exercises while walking in hallway and he is extremely unsteady. He will continue to benefit from higher level balance exercises throughout remainder of hospitalizations.   Balance Overall balance assessment: Needs assistance, History of Falls Sitting-balance support: Feet supported Sitting balance-Leahy Scale: Good     Standing balance support: No upper extremity supported, During functional activity Standing balance-Leahy Scale: Fair Standing balance comment: static good, dynamic fair to poor    High Level Balance Comments: pt struggles with multi-task balance exercises whiel ambulating in hallway    Cognition Arousal/Alertness: Awake/alert Behavior During Therapy: WFL for tasks assessed/performed Overall Cognitive Status: Within Functional Limits for tasks assessed Area of Impairment: Safety/judgement, Awareness    Current Attention Level: Focused   Following Commands: Follows one step commands  consistently, Follows one step commands with increased time Safety/Judgement: Decreased awareness of safety, Decreased awareness of deficits     General Comments: Pt is A and O x 4. He agrees to session and asked about use of AFO with gait traiing               Pertinent Vitals/Pain Pain Assessment Pain Assessment: 0-10 Pain Score: 2  Pain Location: L shoulder/collar bone Pain Descriptors / Indicators: Discomfort, Sore Pain Intervention(s): Limited activity within patient's tolerance, Monitored during session, Repositioned, Premedicated before session     PT Goals (current goals can now be found in the care plan section) Acute Rehab PT Goals Patient Stated Goal: I want to go home Progress towards PT goals: Progressing toward goals    Frequency    Min 2X/week      PT Plan Current plan remains appropriate    Co-evaluation     PT goals addressed during session: Balance;Proper use of DME;Strengthening/ROM;Mobility/safety with mobility        AM-PAC PT "6 Clicks" Mobility   Outcome Measure  Help needed turning from your back to your side while in a flat bed without using bedrails?: None Help needed moving from lying on your back to sitting on the side of a flat bed without using bedrails?: None Help needed moving to and from a bed to a chair (including a wheelchair)?: A Little Help needed standing up from a chair using your arms (e.g., wheelchair or bedside chair)?: A Little Help needed to walk in hospital room?: A Little Help needed climbing 3-5 steps with a railing? : A Little 6 Click Score: 20    End of Session Equipment Utilized During Treatment: Gait belt Activity Tolerance: Patient tolerated treatment well Patient left: in chair;with call bell/phone within reach;with chair alarm set Nurse Communication: Mobility status PT Visit Diagnosis: Unsteadiness on feet (R26.81);Muscle weakness (generalized) (M62.81);Repeated falls (R29.6) Pain - Right/Left:  Left Pain - part of body: Shoulder     Time: 5852-7782 PT Time Calculation (min) (ACUTE ONLY): 12 min  Charges:  $Gait Training: 8-22 mins                     Julaine Fusi PTA 04/03/22, 2:22 PM

## 2022-04-04 DIAGNOSIS — F1020 Alcohol dependence, uncomplicated: Secondary | ICD-10-CM | POA: Diagnosis not present

## 2022-04-04 NOTE — Plan of Care (Signed)
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
## 2022-04-05 DIAGNOSIS — F1020 Alcohol dependence, uncomplicated: Secondary | ICD-10-CM | POA: Diagnosis not present
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
## 2022-04-06 DIAGNOSIS — F1020 Alcohol dependence, uncomplicated: Secondary | ICD-10-CM | POA: Diagnosis not present
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
## 2022-04-06 NOTE — Progress Notes (Signed)
Mobility Specialist - Progress Note    04/06/22 1600  Mobility  Activity Ambulated with assistance in hallway;Stood at bedside  Level of Assistance Standby assist, set-up cues, supervision of patient - no hands on  Assistive Device None  Distance Ambulated (ft) 180 ft  Activity Response Tolerated well  $Mobility charge 1 Mobility    Pt sitting in recliner upon arrival using RA. Completes STS ModI and ambulates MinA --- mild LOB noted. Opted out of AFO this date but states it feels better that way. Returns to recliner with needs in reach.  Merrily Brittle Mobility Specialist 04/06/22, 4:34 PM

## 2022-04-06 NOTE — Progress Notes (Signed)
Physical Therapy Treatment Patient Details Name: Douglas Peterson. MRN: [id number] DOB: 1951-05-25 Today's Date: 04/06/2022   History of Present Illness 70yoM here last week after fall at home with bilat hip pain, dry mouth, sore throat, he was discharged home and now returns after another fall at home, now with L clavicular fx . PMH: ETOH abuse with frequent aqdmissions in 2022 & 2021, AF, HTN, remote brain injury with chronic spastic hemiplegia. Pt has a legal guardian.    PT Comments    OOB with ease.  Is able to complete x 3 laps on unit and 3 up.down steps in PT gym with 1 rail L.  Pt opts to stay in recliner.  He does ask for chair alarm to be turned off.  He stated he does turn the bed alarm off on his own so he can stand to use urinal and stand bedside as he wishes and would like to do so from chair also.  Chair alarm is left off per his request as he wishes to return to bed if chair alarm is on.  To encouraged OOB and benefits of chair vs bed it is left off and he is educated to limit to standing and transfers only as he has been doing this on his own from bed without incident.  Voices understanding and agreement.     Recommendations for follow up therapy are one component of a multi-disciplinary discharge planning process, led by the attending physician.  Recommendations may be updated based on patient status, additional functional criteria and insurance authorization.  Follow Up Recommendations  Follow physician's recommendations for discharge plan and follow up therapies     Assistance Recommended at Discharge Intermittent Supervision/Assistance  Patient can return home with the following Assist for transportation;A little help with bathing/dressing/bathroom;A little help with walking and/or transfers;Assistance with cooking/housework;Help with stairs or ramp for entrance   Equipment Recommendations       Recommendations for Other Services       Precautions / Restrictions  Precautions Precautions: Fall Restrictions Weight Bearing Restrictions: Yes LUE Weight Bearing: Weight bearing as tolerated     Mobility  Bed Mobility Overal bed mobility: Modified Independent                  Transfers Overall transfer level: Modified independent Equipment used: None Transfers: Sit to/from Stand                  Ambulation/Gait Ambulation/Gait assistance: Min guard, Supervision Gait Distance (Feet): 400 Feet Assistive device: None Gait Pattern/deviations: Step-through pattern, Decreased step length - right, Decreased step length - left, Decreased stride length, Drifts right/left Gait velocity: decreased     General Gait Details: opts not to use AFO today "I don't think it helps"  Stated he has been working on ROM R foot the past few days and feels it is better   Stairs Stairs: Yes Stairs assistance: Supervision, Min guard Stair Management: One rail Left, Alternating pattern Number of Stairs: 12     Wheelchair Mobility    Modified Rankin (Stroke Patients Only)       Balance Overall balance assessment: Needs assistance, History of Falls Sitting-balance support: Feet supported Sitting balance-Leahy Scale: Good     Standing balance support: No upper extremity supported, During functional activity Standing balance-Leahy Scale: Fair Standing balance comment: static good, dynamic fair to poor  Cognition Arousal/Alertness: Awake/alert Behavior During Therapy: WFL for tasks assessed/performed Overall Cognitive Status: Within Functional Limits for tasks assessed                                          Exercises      General Comments        Pertinent Vitals/Pain Pain Assessment Pain Assessment: Faces Faces Pain Scale: Hurts a little bit Pain Location: L shoulder/collar bone Pain Descriptors / Indicators: Discomfort, Sore Pain Intervention(s): Limited activity within  patient's tolerance, Monitored during session    Home Living                          Prior Function            PT Goals (current goals can now be found in the care plan section) Progress towards PT goals: Progressing toward goals    Frequency    Min 2X/week      PT Plan Current plan remains appropriate    Co-evaluation              AM-PAC PT "6 Clicks" Mobility   Outcome Measure  Help needed turning from your back to your side while in a flat bed without using bedrails?: None Help needed moving from lying on your back to sitting on the side of a flat bed without using bedrails?: None Help needed moving to and from a bed to a chair (including a wheelchair)?: None Help needed standing up from a chair using your arms (e.g., wheelchair or bedside chair)?: None Help needed to walk in hospital room?: A Little Help needed climbing 3-5 steps with a railing? : A Little 6 Click Score: 22    End of Session Equipment Utilized During Treatment: Gait belt Activity Tolerance: Patient tolerated treatment well Patient left: in chair;with call bell/phone within reach Nurse Communication: Mobility status PT Visit Diagnosis: Unsteadiness on feet (R26.81);Muscle weakness (generalized) (M62.81);Repeated falls (R29.6) Pain - Right/Left: Left Pain - part of body: Shoulder     Time: 1007-1219 PT Time Calculation (min) (ACUTE ONLY): 14 min  Charges:  $Gait Training: 8-22 mins                   Chesley Noon, PTA 04/06/22, 10:58 AM

## 2022-04-07 DIAGNOSIS — F1020 Alcohol dependence, uncomplicated: Secondary | ICD-10-CM | POA: Diagnosis not present
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
## 2022-04-07 NOTE — TOC Progression Note (Signed)
Transition of Care (TOC) - Progression Note    Patient Details  Name: Douglas Peterson. MRN: [id number] Date of Birth: 1951/05/03  Transition of Care [facility]) CM/SW Contact  Douglas Stanford, LCSW Phone Number: 04/07/2022, 11:30 AM  Clinical Narrative:   CSW spoke with Douglas Peterson with DSS and at this time they are waiting on pt's competency hearing on the 19th. They have also transferred case to [facility] so they can investigate and see if they agree with [place] DSS course of action.  DSS main goal is to get guardianship.  Per [place] have a private room for him however he is not agreeable he wants to go home which at this time is not a safe option. TOC Supervisor and MD updated.   Expected Discharge Plan: [place] Barriers to Discharge: Continued Medical Work up  Expected Discharge Plan and Services Expected Discharge Plan: [place]   Discharge Planning Services: CM Consult Post Acute Care Choice: [place] arrangements for the past 2 months: Single Family Home Expected Discharge Date: 03/21/22               DME Arranged: N/A DME Agency: NA       HH Arranged: PT, OT HH Agency: [place] Date HH Agency Contacted: 03/18/22 Time [place]: [place] Representative spoke with at [place]: [place] ([place]) Interventions    Readmission Risk Interventions    03/14/2022   12:48 PM 08/23/2020   11:52 AM 08/19/2020    1:32 PM  Readmission Risk Prevention Plan  Transportation Screening Complete Complete Complete  PCP or Specialist Appt within 3-5 Days Complete    HRI or [place] Complete    Social Work Consult for [place] Planning/Counseling Complete    Palliative Care Screening Not Applicable    Medication Review Press photographer) Complete Complete Complete  PCP or Specialist appointment within 3-5 days of discharge   Complete   HRI or [place]  Complete   SW Recovery Care/Counseling Consult   Complete  [place]  Not Applicable

## 2022-04-07 NOTE — Progress Notes (Signed)
Physical Therapy Treatment Patient Details Name: Douglas Peterson. MRN: [id number] DOB: 1951/05/03 Today's Date: 04/07/2022   History of Present Illness 70yoM here last week after fall at home with bilat hip pain, dry mouth, sore throat, he was discharged home and now returns after another fall at home, now with L clavicular fx . PMH: ETOH abuse with frequent aqdmissions in 2022 & 2021, AF, HTN, remote brain injury with chronic spastic hemiplegia. Pt has a legal guardian.    PT Comments    Completed 4 laps and 12 steps with min guard/supervision.    Recommendations for follow up therapy are one component of a multi-disciplinary discharge planning process, led by the attending physician.  Recommendations may be updated based on patient status, additional functional criteria and insurance authorization.  Follow Up Recommendations  Follow physician's recommendations for discharge plan and follow up therapies     Assistance Recommended at Discharge Intermittent Supervision/Assistance  Patient can return home with the following Assist for transportation;A little help with bathing/dressing/bathroom;A little help with walking and/or transfers;Assistance with cooking/housework;Help with stairs or ramp for entrance   Equipment Recommendations  None recommended by PT    Recommendations for Other Services       Precautions / Restrictions Restrictions Weight Bearing Restrictions: Yes LUE Weight Bearing: Weight bearing as tolerated     Mobility  Bed Mobility               General bed mobility comments: in chair before and after    Transfers Overall transfer level: Modified independent Equipment used: None   Sit to Stand: Supervision, Modified independent (Device/Increase time)                Ambulation/Gait Ambulation/Gait assistance: Min guard, Supervision Gait Distance (Feet): 500 Feet Assistive device: None Gait Pattern/deviations: Step-through pattern, Decreased  step length - right, Decreased step length - left, Decreased stride length, Drifts right/left Gait velocity: decreased         Stairs Stairs: Yes Stairs assistance: Supervision, Min guard Stair Management: One rail Left, Alternating pattern Number of Stairs: 12 General stair comments: used LUE for balance but does not seem to pull or put weigth on LUE   Wheelchair Mobility    Modified Rankin (Stroke Patients Only)       Balance Overall balance assessment: Needs assistance, History of Falls Sitting-balance support: Feet supported Sitting balance-Leahy Scale: Good     Standing balance support: No upper extremity supported, During functional activity Standing balance-Leahy Scale: Fair Standing balance comment: static good, dynamic fair to poor                            Cognition Arousal/Alertness: Awake/alert Behavior During Therapy: WFL for tasks assessed/performed Overall Cognitive Status: Within Functional Limits for tasks assessed                                          Exercises      General Comments        Pertinent Vitals/Pain Pain Assessment Pain Assessment: No/denies pain    Home Living                          Prior Function            PT Goals (current goals can now be found in the  care plan section) Progress towards PT goals: Progressing toward goals    Frequency    Min 2X/week      PT Plan Current plan remains appropriate    Co-evaluation              AM-PAC PT "6 Clicks" Mobility   Outcome Measure  Help needed turning from your back to your side while in a flat bed without using bedrails?: None Help needed moving from lying on your back to sitting on the side of a flat bed without using bedrails?: None Help needed moving to and from a bed to a chair (including a wheelchair)?: None Help needed standing up from a chair using your arms (e.g., wheelchair or bedside chair)?: None Help  needed to walk in hospital room?: A Little Help needed climbing 3-5 steps with a railing? : A Little 6 Click Score: 22    End of Session Equipment Utilized During Treatment: Gait belt Activity Tolerance: Patient tolerated treatment well Patient left: in chair;with call bell/phone within reach Nurse Communication: Mobility status PT Visit Diagnosis: Unsteadiness on feet (R26.81);Muscle weakness (generalized) (M62.81);Repeated falls (R29.6) Pain - Right/Left: Left Pain - part of body: Shoulder     Time: 3475-8307 PT Time Calculation (min) (ACUTE ONLY): 17 min  Charges:  $Gait Training: 8-22 mins                   Chesley Noon, PTA 04/07/22, 9:31 AM

## 2022-04-08 DIAGNOSIS — F1020 Alcohol dependence, uncomplicated: Secondary | ICD-10-CM | POA: Diagnosis not present

## 2022-04-08 NOTE — Progress Notes (Signed)
Mobility Specialist - Progress Note   04/08/22 1000  Mobility  Activity Ambulated with assistance in hallway  Level of Assistance Standby assist, set-up cues, supervision of patient - no hands on  Assistive Device None  Distance Ambulated (ft) 640 ft  Activity Response Tolerated well  $Mobility charge 1 Mobility     Pt standing at sink upon arrival with St-RN at bedside. Pt motivated for activity. Ambulated 4 laps around nurses station with no LOB but still unsteady. During final lap, pt does voice feeling a little shaky in RLE. Pt returned to recliner with needs in reach. Alarm off per pt request with understanding to call for assistance as needed for transfers.    Kathee Delton Mobility Specialist 04/08/22, 10:23 AM

## 2022-04-08 NOTE — Progress Notes (Signed)
Physical Therapy Discharge Patient Details Name: Douglas Peterson. MRN: [id number] DOB: 1951-02-20 Today's Date: 04/08/2022 Time: 1730   Case discussed with patient as well as treatment team; including PTs, PTAs, mobility tech and nursing staff.  Pt has recently been consistently able to ambulate multiple hundreds of feet multiple times a day without direct assist.  Trial of AFO earlier helped some at the time but pt showed me today that he has been working hard on DF activities feels that the AFO now gets more in the way than helping as he has improved the control in his ankle, he does not wish to pursue AFO any further.  Pt eager to continue working with mobility tech and nursing staff, but agrees that he no longer requires formal skilled PT as he is at/near his baseline and generally doing/feeling well.  Of note, walked 640 ft with mobility tech earlier today and has, consistently, been able to walk community appropriate distances w/o AD and w/o significant safety concerns over the 3 weeks of this hospitalization.  Should his function/situation change to require further PT please place new orders.  PT now signing off.   Patient discharged from PT services secondary to goals met and no further PT needs identified.  Please see latest therapy progress note for current level of functioning and progress toward goals.    Progress and discharge plan discussed with patient and/or caregiver: Patient/Caregiver agrees with plan       Kreg Shropshire, DPT 04/08/2022, 5:32 PM
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
## 2022-04-09 DIAGNOSIS — F1020 Alcohol dependence, uncomplicated: Secondary | ICD-10-CM | POA: Diagnosis not present

## 2022-04-09 LAB — BASIC METABOLIC PANEL
Anion gap: 6 (ref 5–15)
BUN: 17 mg/dL (ref 8–23)
CO2: 28 mmol/L (ref 22–32)
Calcium: 9.5 mg/dL (ref 8.9–10.3)
Chloride: 104 mmol/L (ref 98–111)
Creatinine, Ser: 0.56 mg/dL — ABNORMAL LOW (ref 0.61–1.24)
GFR, Estimated: >60 mL/min (ref >60)
Glucose, Bld: 98 mg/dL (ref 70–99)
Potassium: 4 mmol/L (ref 3.5–5.1)
Sodium: 138 mmol/L (ref 135–145)

## 2022-04-09 LAB — CBC
HCT: 45.1 % (ref 39.0–52.0)
Hemoglobin: 14.8 g/dL (ref 13.0–17.0)
MCH: 31.2 pg (ref 26.0–34.0)
MCHC: 32.8 g/dL (ref 30.0–36.0)
MCV: 94.9 fL (ref 80.0–100.0)
Platelets: 199 10*3/uL (ref 150–400)
RBC: 4.75 MIL/uL (ref 4.22–5.81)
RDW: 12.2 % (ref 11.5–15.5)
WBC: 7.1 10*3/uL (ref 4.0–10.5)
nRBC: 0 % (ref 0.0–0.2)
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
## 2022-04-09 NOTE — Progress Notes (Signed)
Mobility Specialist - Progress Note    04/09/22 1552  Mobility  Activity Ambulated with assistance in hallway;Stood at bedside;Dangled on edge of bed  Level of Assistance Minimal assist, patient does 75% or more  Assistive Device None  Distance Ambulated (ft) 100 ft  Activity Response Tolerated well  $Mobility charge 1 Mobility     Pre-mobility: HR, BP, SpO2 During mobility: HR, BP, SpO2 Post-mobility: HR, BP, SPO2   Pt is supine upon arrival using RA with NT present. BP pre mobility 117/79. Completes bed mobility and STS with supervision and ambulates 152f MinA-CGA. Pt has period of significant LOB needing total assist, short period of delayed response but remains standing and is descended safely into wheel chair. RN and NT assist with safe patient return to room.  Pt initially states that his legs had given out but upon return to room voices light headedness. BP post mobility is 150/80, RN is left in room.  MMerrily BrittleMobility Specialist 04/09/22, 3:57 PM

## 2022-04-09 NOTE — TOC Progression Note (Signed)
Transition of Care (TOC) - Progression Note    Patient Details  Name: Douglas Peterson. MRN: [id number] Date of Birth: 06-06-1951  Transition of Care [facility]) CM/SW Contact  Eileen Stanford, LCSW Phone Number: 04/09/2022, 3:11 PM  Clinical Narrative:   Per Davonna Belling with [facility] we can not hold pt here against his will as at this time pt is deemed competent to make his own decisions.    Expected Discharge Plan: [place] Barriers to Discharge: Continued Medical Work up  Expected Discharge Plan and Services Expected Discharge Plan: [place]   Discharge Planning Services: CM Consult Post Acute Care Choice: [place] arrangements for the past 2 months: Single Family Home Expected Discharge Date: 03/21/22               DME Arranged: N/A DME Agency: NA       HH Arranged: PT, OT HH Agency: [place] Date HH Agency Contacted: 03/18/22 Time [place]: [place] Representative spoke with at [place]: [place] ([place]) Interventions    Readmission Risk Interventions    03/14/2022   12:48 PM 08/23/2020   11:52 AM 08/19/2020    1:32 PM  Readmission Risk Prevention Plan  Transportation Screening Complete Complete Complete  PCP or Specialist Appt within 3-5 Days Complete    HRI or [place] Complete    Social Work Consult for [place] Planning/Counseling Complete    Palliative Care Screening Not Applicable    Medication Review Press photographer) Complete Complete Complete  PCP or Specialist appointment within 3-5 days of discharge   Complete  HRI or [place]  Complete   SW Recovery Care/Counseling Consult   Complete  [place]  Not Applicable

## 2022-04-09 NOTE — Progress Notes (Signed)
Mobility Specialist - Progress Note    04/09/22 1132  Mobility  Activity Ambulated independently in hallway;Stood at bedside;Dangled on edge of bed  Level of Assistance Minimal assist, patient does 75% or more  Assistive Device None  Distance Ambulated (ft) 480 ft  Activity Response Tolerated well  $Mobility charge 1 Mobility    Pt sitting EOB upon arrival using RA. Able to donn shoes in figure four position ModI and ambulates MinA-CGA tolerating well. Pt is left with needs in reach in recliner.  Merrily Brittle Mobility Specialist 04/09/22, 11:36 AM

## 2022-04-10 DIAGNOSIS — F1020 Alcohol dependence, uncomplicated: Secondary | ICD-10-CM | POA: Diagnosis not present

## 2022-04-10 MED ORDER — ATENOLOL 25 MG PO TABS: 25.0000 mg | ORAL_TABLET | Freq: Every day | ORAL | 1 refills | Status: DC

## 2022-04-10 MED ORDER — RIVAROXABAN 20 MG PO TABS: 20.0000 mg | ORAL_TABLET | Freq: Every day | ORAL | 2 refills | Status: DC

## 2022-04-10 NOTE — Plan of Care (Signed)

## 2022-04-10 NOTE — Care Management Important Message (Signed)
Important Message  Patient Details  Name: Douglas Peterson. MRN: [id number] Date of Birth: [birth year]   Medicare Important Message Given:  Yes     Juliann Pulse A  04/10/2022, 10:49 AM

## 2022-04-10 NOTE — Discharge Summary (Signed)
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 night-coverage

## 2022-04-10 NOTE — Discharge Instructions (Signed)
Advised to follow-up with primary care physician in 1 week. Advised to continue current medications as prescribed.

## 2022-04-10 NOTE — TOC Transition Note (Signed)
Transition of Care [facility]) - CM/SW Discharge Note   Patient Details  Name: Douglas Peterson. MRN: [id number] Date of Birth: February 24, 1951  Transition [facility]) CM/SW Contact:  Eileen Stanford, LCSW Phone Number: 04/10/2022, 11:37 AM   Clinical Narrative:   Taxi form completed and faxed to [employer]. They are here to pick pt up. RN aware.    Final next level of care: Home/Self Care Barriers to Discharge: No Barriers Identified   Patient Goals and CMS Choice Patient states their goals for this hospitalization and ongoing recovery are:: Agrees with home with home health CMS Medicare.gov Compare Post Acute Care list provided to:: Patient Choice offered to / list presented to : Patient  Discharge Placement                    Patient and family notified of of transfer: 04/10/22  Discharge Plan and Services   Discharge Planning Services: CM Consult Post Acute Care Choice: Home Health          DME Arranged: N/A DME Agency: NA       HH Arranged: PT, OT [place] Agency: [place] Date [place]: 03/18/22 Time [place]: [id number] Representative spoke with at [place]: [place] ([place]) Interventions     Readmission Risk Interventions    03/14/2022   12:48 PM 08/23/2020   11:52 AM 08/19/2020    1:32 PM  Readmission Risk Prevention Plan  Transportation Screening Complete Complete Complete  PCP or Specialist Appt within 3-5 Days Complete    HRI or [place] Complete    Social Work Consult for [place] Planning/Counseling Complete    Palliative Care Screening Not Applicable    Medication Review Press photographer) Complete Complete Complete  PCP or Specialist appointment within 3-5 days of discharge   Complete  HRI or [place]  Complete   SW Recovery Care/Counseling Consult   Complete  [place]  Not Applicable

## 2022-04-20 ENCOUNTER — Emergency Department
Admission: EM | Admit: 2022-04-20 | Discharge: 2022-04-21 | Disposition: A | Discharge status: Home / Self Care | Payer: Medicare HMO | Attending: Emergency Medicine | Admitting: Emergency Medicine

## 2022-04-20 ENCOUNTER — Other Ambulatory Visit: Payer: Self-pay

## 2022-04-20 DIAGNOSIS — R2681 Unsteadiness on feet: Secondary | ICD-10-CM | POA: Diagnosis not present

## 2022-04-20 DIAGNOSIS — M6281 Muscle weakness (generalized): Secondary | ICD-10-CM | POA: Diagnosis present

## 2022-04-20 DIAGNOSIS — F10920 Alcohol use, unspecified with intoxication, uncomplicated: Secondary | ICD-10-CM

## 2022-04-20 DIAGNOSIS — R112 Nausea with vomiting, unspecified: Secondary | ICD-10-CM | POA: Diagnosis not present

## 2022-04-20 DIAGNOSIS — F10129 Alcohol abuse with intoxication, unspecified: Secondary | ICD-10-CM | POA: Insufficient documentation

## 2022-04-20 DIAGNOSIS — R197 Diarrhea, unspecified: Secondary | ICD-10-CM | POA: Diagnosis not present

## 2022-04-20 DIAGNOSIS — R296 Repeated falls: Secondary | ICD-10-CM | POA: Diagnosis not present

## 2022-04-20 DIAGNOSIS — R4182 Altered mental status, unspecified: Secondary | ICD-10-CM | POA: Diagnosis not present

## 2022-04-20 DIAGNOSIS — F1010 Alcohol abuse, uncomplicated: Secondary | ICD-10-CM

## 2022-04-20 DIAGNOSIS — Y908 Blood alcohol level of 240 mg/100 ml or more: Secondary | ICD-10-CM | POA: Diagnosis not present

## 2022-04-20 DIAGNOSIS — R531 Weakness: Secondary | ICD-10-CM

## 2022-04-20 LAB — COMPREHENSIVE METABOLIC PANEL
ALT: 96 U/L — ABNORMAL HIGH (ref 0–44)
AST: 107 U/L — ABNORMAL HIGH (ref 15–41)
Albumin: 4.1 g/dL (ref 3.5–5.0)
Alkaline Phosphatase: 102 U/L (ref 38–126)
Anion gap: 16 — ABNORMAL HIGH (ref 5–15)
BUN: <5 mg/dL — ABNORMAL LOW (ref 8–23)
CO2: 26 mmol/L (ref 22–32)
Calcium: 8.6 mg/dL — ABNORMAL LOW (ref 8.9–10.3)
Chloride: 87 mmol/L — ABNORMAL LOW (ref 98–111)
Creatinine, Ser: 0.43 mg/dL — ABNORMAL LOW (ref 0.61–1.24)
GFR, Estimated: >60 mL/min (ref >60)
Glucose, Bld: 76 mg/dL (ref 70–99)
Potassium: 4.1 mmol/L (ref 3.5–5.1)
Sodium: 129 mmol/L — ABNORMAL LOW (ref 135–145)
Total Bilirubin: 1.1 mg/dL (ref 0.3–1.2)
Total Protein: 8 g/dL (ref 6.5–8.1)

## 2022-04-20 LAB — CBC
HCT: 45.8 % (ref 39.0–52.0)
Hemoglobin: 15.7 g/dL (ref 13.0–17.0)
MCH: 31 pg (ref 26.0–34.0)
MCHC: 34.3 g/dL (ref 30.0–36.0)
MCV: 90.3 fL (ref 80.0–100.0)
Platelets: 157 10*3/uL (ref 150–400)
RBC: 5.07 MIL/uL (ref 4.22–5.81)
RDW: 12 % (ref 11.5–15.5)
WBC: 12.1 10*3/uL — ABNORMAL HIGH (ref 4.0–10.5)
nRBC: 0 % (ref 0.0–0.2)

## 2022-04-20 LAB — ETHANOL: Alcohol, Ethyl (B): 311 mg/dL (ref <10)

## 2022-04-20 MED ORDER — LORAZEPAM 1 MG PO TABS
1.0000 mg | ORAL_TABLET | ORAL | Status: DC | PRN
  Administered 2022-04-22: 1 mg via ORAL
  Filled 2022-04-20: qty 1

## 2022-04-20 MED ORDER — FOLIC ACID 1 MG PO TABS
1.0000 mg | ORAL_TABLET | Freq: Every day | ORAL | Status: DC
  Administered 2022-04-21: 1 mg via ORAL
  Filled 2022-04-20: qty 1

## 2022-04-20 MED ORDER — THIAMINE HCL 100 MG/ML IJ SOLN
100.0000 mg | Freq: Every day | INTRAMUSCULAR | Status: DC
  Administered 2022-04-21: 100 mg via INTRAVENOUS
  Filled 2022-04-20: qty 2

## 2022-04-20 MED ORDER — LORAZEPAM 2 MG/ML IJ SOLN
1.0000 mg | INTRAMUSCULAR | Status: DC | PRN
  Administered 2022-04-21 (×2): 1 mg via INTRAVENOUS
  Filled 2022-04-20 (×2): qty 1

## 2022-04-20 MED ORDER — SODIUM CHLORIDE 0.9 % IV BOLUS
1000.0000 mL | Freq: Once | INTRAVENOUS | Status: AC
  Administered 2022-04-20: 1000 mL via INTRAVENOUS

## 2022-04-20 MED ORDER — THIAMINE HCL 100 MG PO TABS
100.0000 mg | ORAL_TABLET | Freq: Every day | ORAL | Status: DC
  Administered 2022-04-22: 100 mg via ORAL
  Filled 2022-04-20: qty 1

## 2022-04-20 MED ORDER — ADULT MULTIVITAMIN W/MINERALS CH
1.0000 | ORAL_TABLET | Freq: Every day | ORAL | Status: DC
  Administered 2022-04-21 – 2022-04-22 (×2): 1 via ORAL
  Filled 2022-04-20 (×3): qty 1

## 2022-04-20 MED ORDER — LOPERAMIDE HCL 2 MG PO CAPS
4.0000 mg | ORAL_CAPSULE | Freq: Once | ORAL | Status: AC
  Administered 2022-04-20: 4 mg via ORAL
  Filled 2022-04-20: qty 2

## 2022-04-20 MED ORDER — ONDANSETRON HCL 4 MG/2ML IJ SOLN
4.0000 mg | Freq: Once | INTRAMUSCULAR | Status: AC
  Administered 2022-04-20: 4 mg via INTRAVENOUS
  Filled 2022-04-20: qty 2

## 2022-04-20 NOTE — ED Notes (Signed)
Pt given warm blankets at this time

## 2022-04-20 NOTE — ED Notes (Signed)
This rn with another tired to get and IV 3x , IV consult in

## 2022-04-20 NOTE — ED Notes (Signed)
IV team in room now

## 2022-04-20 NOTE — ED Provider Notes (Signed)
[facility] Provider Note   Event Date/Time   First MD Initiated Contact with Patient 04/20/22 2017     (approximate) History  Alcohol Intoxication and Weakness  HPI Douglas Peterson. is a 71 y.o. male with a past medical history of severe alcohol abuse who presents intoxicated complaining of nausea/vomiting/diarrhea over the past 5 days with poor p.o. intake.  Patient also complains of lower extremity weakness.  Patient requesting admission ROS: Patient currently denies any vision changes, tinnitus, difficulty speaking, facial droop, sore throat, chest pain, shortness of breath, abdominal pain, dysuria, or numbness/paresthesias in any extremity   Physical Exam  Triage Vital Signs: ED Triage Vitals  Enc Vitals Group     BP 04/20/22 2007 (!) 165/95     Pulse Rate 04/20/22 2100 84     Resp 04/20/22 2007 14     Temp 04/20/22 2007 98.6 F (37 C)     Temp Source 04/20/22 2007 Oral     SpO2 04/20/22 2006 99 %     Weight 04/20/22 2008 142 lb (64.4 kg)     Height 04/20/22 2008 5' 3"  (1.6 m)     Head Circumference --      Peak Flow --      Pain Score 04/20/22 2008 4     Pain Loc --      Pain Edu? --      Excl. in [place]? --    Most recent vital signs: Vitals:   04/20/22 2100 04/20/22 2130  BP: (!) 145/84 (!) 157/83  Pulse: 84 86  Resp: (!) 25 20  Temp:    SpO2: 95% 95%   General: Awake, oriented x4. CV:  Good peripheral perfusion.  Resp:  Normal effort.  Abd:  No distention.  Other:  Elderly disheveled Caucasian male laying in bed with slurred speech and conjunctival injection. ED Results / Procedures / Treatments  Labs (all labs ordered are listed, but only abnormal results are displayed) Labs Reviewed  ETHANOL - Abnormal; Notable for the following components:      Result Value   Alcohol, Ethyl (B) 311 (*)    All other components within normal limits  CBC - Abnormal; Notable for the following components:   WBC 12.1 (*)    All other components  within normal limits  COMPREHENSIVE METABOLIC PANEL - Abnormal; Notable for the following components:   Sodium 129 (*)    Chloride 87 (*)    BUN <5 (*)    Creatinine, Ser 0.43 (*)    Calcium 8.6 (*)    AST 107 (*)    ALT 96 (*)    Anion gap 16 (*)    All other components within normal limits   EKG ED ECG REPORT I, Douglas Peterson, the attending physician, personally viewed and interpreted this ECG. Date: 04/20/2022 EKG Time: 2007 Rate: 78 Rhythm: normal sinus rhythm QRS Axis: normal Intervals: normal ST/T Wave abnormalities: normal Narrative Interpretation: no evidence of acute ischemia PROCEDURES: Critical Care performed: No Procedures MEDICATIONS ORDERED IN ED: Medications  ondansetron (ZOFRAN) injection 4 mg (4 mg Intravenous Given 04/20/22 2146)  sodium chloride 0.9 % bolus 1,000 mL (1,000 mLs Intravenous Bolus 04/20/22 2146)   IMPRESSION / MDM / ASSESSMENT AND PLAN / ED COURSE  I reviewed the triage vital signs and the nursing notes.  The patient is on the cardiac monitor to evaluate for evidence of arrhythmia and/or significant heart rate changes. Patient's presentation is most consistent with acute presentation with potential threat to life or bodily function.  Presents with altered mental status. +Slurred, sluggish behavior. Stated EtOH intoxication. Airway maintained. Unlikely intracranial bleed, opioid intoxication or coingestion, sepsis, hypothyroidism. Suspect likely transient course of intoxication with expected  improvement of symptoms as patient metabolizes offending agent.  Plan: frequent reassessments  Reassessment Note: Time: 3 hours since initial presentation. Evaluation: Frequent mental status exams showed improving symptoms and evidence that the patients AMS was secondary to intoxication. Pt able to ambulate without difficulty and PO tolerant. Plan DC home with ride and return precautions. Disposition: Discharge home     FINAL CLINICAL IMPRESSION(S) / ED DIAGNOSES   Final diagnoses:  Acute alcoholic intoxication without complication ([place])  Weakness   Rx / DC Orders   ED Discharge Orders     None      Note:  This document was prepared using Dragon voice recognition software and may include unintentional dictation errors.   Douglas Plummer, MD 04/20/22 [phone]

## 2022-04-20 NOTE — ED Triage Notes (Addendum)
pt coming from home, pt etoh on board, pt weak , N/V/D, pt c/o pain on his left side, VSS. 12L unremarkable. Cbg 109

## 2022-04-21 MED ORDER — RIVAROXABAN 20 MG PO TABS
20.0000 mg | ORAL_TABLET | Freq: Every day | ORAL | Status: DC
  Administered 2022-04-22: 20 mg via ORAL
  Filled 2022-04-21: qty 1

## 2022-04-21 MED ORDER — PAROXETINE HCL 30 MG PO TABS
30.0000 mg | ORAL_TABLET | Freq: Every day | ORAL | Status: DC
  Administered 2022-04-22: 30 mg via ORAL
  Filled 2022-04-21: qty 1

## 2022-04-21 MED ORDER — PANTOPRAZOLE SODIUM 20 MG PO TBEC
20.0000 mg | DELAYED_RELEASE_TABLET | Freq: Every day | ORAL | Status: DC
  Administered 2022-04-22: 20 mg via ORAL
  Filled 2022-04-21: qty 1

## 2022-04-21 MED ORDER — TIZANIDINE HCL 2 MG PO TABS
4.0000 mg | ORAL_TABLET | Freq: Every day | ORAL | Status: DC
  Administered 2022-04-21 – 2022-04-22 (×2): 4 mg via ORAL
  Filled 2022-04-21 (×2): qty 2

## 2022-04-21 MED ORDER — FOLIC ACID 1 MG PO TABS
1.0000 mg | ORAL_TABLET | Freq: Every day | ORAL | Status: DC
  Administered 2022-04-21 – 2022-04-22 (×2): 1 mg via ORAL
  Filled 2022-04-21 (×2): qty 1

## 2022-04-21 MED ORDER — ASCORBIC ACID 500 MG PO TABS
1000.0000 mg | ORAL_TABLET | Freq: Every day | ORAL | Status: DC
  Administered 2022-04-21 – 2022-04-22 (×2): 1000 mg via ORAL
  Filled 2022-04-21 (×2): qty 2

## 2022-04-21 MED ORDER — SENNOSIDES-DOCUSATE SODIUM 8.6-50 MG PO TABS
2.0000 | ORAL_TABLET | Freq: Every day | ORAL | Status: DC
Start: 2022-04-21 — End: 2022-04-22

## 2022-04-21 MED ORDER — POLYETHYLENE GLYCOL 3350 17 G PO PACK
17.0000 g | PACK | Freq: Every day | ORAL | Status: DC
  Administered 2022-04-22: 17 g via ORAL
  Filled 2022-04-21 (×2): qty 1

## 2022-04-21 MED ORDER — ASPIRIN 81 MG PO TBEC
81.0000 mg | DELAYED_RELEASE_TABLET | Freq: Every day | ORAL | Status: DC
  Administered 2022-04-21 – 2022-04-22 (×2): 81 mg via ORAL
  Filled 2022-04-21 (×2): qty 1

## 2022-04-21 MED ORDER — METOCLOPRAMIDE HCL 5 MG/ML IJ SOLN
10.0000 mg | Freq: Once | INTRAMUSCULAR | Status: AC
  Administered 2022-04-21: 10 mg via INTRAVENOUS
  Filled 2022-04-21: qty 2

## 2022-04-21 MED ORDER — ATORVASTATIN CALCIUM 20 MG PO TABS
10.0000 mg | ORAL_TABLET | Freq: Every day | ORAL | Status: DC
  Administered 2022-04-21 – 2022-04-22 (×2): 10 mg via ORAL
  Filled 2022-04-21 (×2): qty 1

## 2022-04-21 MED ORDER — THIAMINE HCL 100 MG PO TABS
100.0000 mg | ORAL_TABLET | Freq: Every day | ORAL | Status: DC
  Administered 2022-04-21: 100 mg via ORAL
  Filled 2022-04-21 (×2): qty 1

## 2022-04-21 MED ORDER — TAMSULOSIN HCL 0.4 MG PO CAPS
0.4000 mg | ORAL_CAPSULE | Freq: Every day | ORAL | Status: DC
  Administered 2022-04-21 – 2022-04-22 (×2): 0.4 mg via ORAL
  Filled 2022-04-21 (×2): qty 1

## 2022-04-21 MED ORDER — ATENOLOL 25 MG PO TABS
25.0000 mg | ORAL_TABLET | Freq: Every day | ORAL | Status: DC
  Administered 2022-04-21 – 2022-04-22 (×2): 25 mg via ORAL
  Filled 2022-04-21 (×2): qty 1

## 2022-04-21 NOTE — ED Notes (Signed)
Pt given night time eye mask and repositioned in bed.

## 2022-04-21 NOTE — Evaluation (Signed)
Physical Therapy Evaluation Patient Details Name: Douglas Peterson. MRN: [id number] DOB: 1951-05-24 Today's Date: 04/21/2022  History of Present Illness  Pt is a 71 yo M who presents to the ED intoxicated complaining of nausea/vomiting/diarrhea over the past 5 days with poor p.o. intake.PMH ETOH abuse with frequent aqdmissions in 2022 & 2021, AF, HTN, remote brain injury with chronic spastic hemiplegia. MD assessment inlcudes Acute alcoholic intoxication without complication ([place])  and Weakness  Clinical Impression  Pt is alert and oriented x3. Pt was pleasant and motivated to participate during the session and put forth good effort throughout. Pt presents with decline in functional mobility from previous recent admission. Pt able to complete bed mobility w/ modA to initiate sit up and for trunk control to prevent posterior lean. Pt able to complete sit to stand w/ modA for steadying. Pt was able to perform step pivot to [facility] but with modA x2 and needing assist for steadying and to prevent heavy posterior lean; very minimal foot clearance and w/ slight knee buckling. Pt will benefit from PT services in a SNF setting upon discharge to safely address deficits listed in patient problem list for decreased caregiver assistance and eventual return to PLOF.     Recommendations for follow up therapy are one component of a multi-disciplinary discharge planning process, led by the attending physician.  Recommendations may be updated based on patient status, additional functional criteria and insurance authorization.  Follow Up Recommendations Skilled nursing-short term rehab (<3 hours/day)    Assistance Recommended at Discharge Intermittent Supervision/Assistance  Patient can return home with the following  Help with stairs or ramp for entrance;A lot of help with bathing/dressing/bathroom;Assistance with cooking/housework;Assist for transportation;A lot of help with walking and/or transfers    Equipment  Recommendations None recommended by PT  Recommendations for Other Services       Functional Status Assessment Patient has had a recent decline in their functional status and demonstrates the ability to make significant improvements in function in a reasonable and predictable amount of time.     Precautions / Restrictions Precautions Precautions: Fall Restrictions Weight Bearing Restrictions: No      Mobility  Bed Mobility Overal bed mobility: Needs Assistance Bed Mobility: Supine to Sit     Supine to sit: Mod assist Sit to supine: Mod assist   General bed mobility comments: modA to sit up and trunk control    Transfers Overall transfer level: Needs assistance Equipment used: 2 person hand held assist Transfers: Sit to/from Stand, Bed to chair/wheelchair/BSC Sit to Stand: Mod assist   Step pivot transfers: Mod assist, +2 physical assistance       General transfer comment: heavy posterior lean and modA to stablilze    Ambulation/Gait Ambulation/Gait assistance: Mod assist, +2 physical assistance Gait Distance (Feet): 3 Feet Assistive device: 2 person hand held assist Gait Pattern/deviations: Step-to pattern, Decreased step length - right, Decreased step length - left       General Gait Details: modA to steady and to prevent posterior lean; decreased foot clearance when step pivoting  Stairs            Wheelchair Mobility    Modified Rankin (Stroke Patients Only)       Balance Overall balance assessment: Needs assistance Sitting-balance support: Single extremity supported, Feet supported Sitting balance-Leahy Scale: Fair     Standing balance support: Bilateral upper extremity supported, During functional activity Standing balance-Leahy Scale: Poor  Pertinent Vitals/Pain Pain Assessment Pain Assessment: No/denies pain    Home Living   Living Arrangements: Alone Available Help at Discharge:  Family;Available PRN/intermittently Type of Home: House Home Access: Stairs to enter (in garage) Entrance Stairs-Rails: Can reach both Entrance Stairs-Number of Steps: 1   Home Layout: One level Home Equipment: Conservation officer, nature (2 wheels);Cane - quad;BSC/3in1;Shower seat      Prior Function Prior Level of Function : Independent/Modified Independent             Mobility Comments: independent community ambulator using cane occassionally ADLs Comments: Independent w/ ADLs     Hand Dominance        Extremity/Trunk Assessment   Upper Extremity Assessment Upper Extremity Assessment: Generalized weakness    Lower Extremity Assessment Lower Extremity Assessment: Generalized weakness       Communication   Communication: No difficulties  Cognition Arousal/Alertness: Awake/alert Behavior During Therapy: WFL for tasks assessed/performed Overall Cognitive Status: Within Functional Limits for tasks assessed                                          General Comments      Exercises     Assessment/Plan    PT Assessment Patient needs continued PT services  PT Problem List Decreased safety awareness;Decreased mobility;Decreased strength;Decreased range of motion;Decreased activity tolerance;Decreased cognition;Decreased balance       PT Treatment Interventions DME instruction;Therapeutic exercise;Gait training;Balance training;Neuromuscular re-education;Stair training;Functional mobility training;Therapeutic activities;Patient/family education    PT Goals (Current goals can be found in the Care Plan section)  Acute Rehab PT Goals Patient Stated Goal: none stated PT Goal Formulation: With patient Time For Goal Achievement: 05/04/22 Potential to Achieve Goals: Fair    Frequency Min 2X/week     Co-evaluation               AM-PAC PT "6 Clicks" Mobility  Outcome Measure Help needed turning from your back to your side while in a flat bed without  using bedrails?: A Lot Help needed moving from lying on your back to sitting on the side of a flat bed without using bedrails?: A Lot Help needed moving to and from a bed to a chair (including a wheelchair)?: A Lot Help needed standing up from a chair using your arms (e.g., wheelchair or bedside chair)?: A Lot Help needed to walk in hospital room?: Total Help needed climbing 3-5 steps with a railing? : Total 6 Click Score: 10    End of Session Equipment Utilized During Treatment: Gait belt Activity Tolerance: Patient tolerated treatment well Patient left: in bed Nurse Communication: Mobility status PT Visit Diagnosis: Unsteadiness on feet (R26.81);Muscle weakness (generalized) (M62.81);Repeated falls (R29.6);Difficulty in walking, not elsewhere classified (R26.2)    Time: 1856-3149 PT Time Calculation (min) (ACUTE ONLY): 21 min   Charges:     PT Treatments $Therapeutic Activity: 8-22 mins        Turner Daniels, SPT  04/21/2022, 4:17 PM

## 2022-04-21 NOTE — Progress Notes (Signed)
OT Cancellation Note  Patient Details Name: Douglas Peterson. MRN: [id number] DOB: 1950-12-04   Cancelled Treatment:    Reason Eval/Treat Not Completed: Other (comment). Upon attempt, pt eating lunch, agreeable to OT re-attempting next date.   Ardeth Perfect., MPH, MS, OTR/L ascom [phone] 04/21/22, 3:19 PM

## 2022-04-21 NOTE — ED Notes (Signed)
Patient working with physical therapy

## 2022-04-21 NOTE — ED Provider Notes (Signed)
-----------------------------------------   5:01 AM on 04/21/2022 -----------------------------------------   Blood pressure (!) 161/80, pulse 95, temperature 98.6 F (37 C), temperature source Oral, resp. rate 11, height 5' 3"  (1.6 m), weight 64.4 kg, SpO2 96 %.  The patient is calm and cooperative at this time.  Patient expressed concern that he is to be discharged as he does not have home health services at home and feels he cannot take care of himself.  Awaiting disposition plan from Social Work team.   Paulette Blanch, MD 04/21/22 [phone]

## 2022-04-21 NOTE — ED Notes (Signed)
This RN to bedside due to patient calling out. Pt requesting a different bed than the one he's on, this RN explained patient up for discharge at this time. Pt denies knowledge of same. This RN explained per EDP Bradler, conversation regarding discharge had been had with patient. Pt states that Dr. Cheri Fowler is "a flagrant liar". Pt requesting to know who Dr. Vashti Hey boss is, requesting to speak with the head nurse of the hospital. Pt states he will call Dr. Corky Downs himself. This RN spoke with Dr. Beather Arbour to clarify the plan, orders placed for SW consult in the morning. Primary RN Dorian made aware of plan.

## 2022-04-21 NOTE — TOC Initial Note (Signed)
Transition of Care (TOC) - Initial/Assessment Note    Patient Details  Name: Douglas Peterson. MRN: [id number] Date of Birth: [birth year]  Transition of Care [facility]) CM/SW Contact:    Shelbie Hutching, RN Phone Number: 04/21/2022, 10:43 AM  Clinical Narrative:                 Patient came into the emergency room for weakness.  [name] a long time alcoholic, patient has been seen in the emergency room and been admitted to the hospital multiple times for alcohol related issues.  Patient does not need to be admitted, patient was requesting to be admitted and reports he is so sick.  RNCM explained that we know he does not feel good but it does not warrant a hospital admission.   Discussed options of patient going home with home health or he could private pay for short term rehab in nursing facility.  Patient does not want to private pay but he would like to see if we can get his [employer] to approve.  MD has ordered PT and OT evaluations.  If [name] does not approve patient agrees to go home.  Sharmon Revere with [facility] confirms that patient was referred to them at his last discharge, they were unable to get in touch with him though.   RNCM starting SNF workup.   Expected Discharge Plan: Skilled Nursing Facility Barriers to Discharge: SNF Pending bed offer   Patient Goals and CMS Choice Patient states their goals for this hospitalization and ongoing recovery are:: Patient would like to see if his insurance will cover SNF CMS Medicare.gov Compare Post Acute Care list provided to:: Patient Choice offered to / list presented to : Patient  Expected Discharge Plan and Services Expected Discharge Plan: [place]   Discharge Planning Services: CM Consult Post Acute Care Choice: [place] Living arrangements for the past 2 months: Single Family Home                 DME Arranged: N/A DME Agency: NA                  Prior Living  Arrangements/Services Living arrangements for the past 2 months: Single Family Home   Patient language and need for interpreter reviewed:: Yes Do you feel safe going back to the place where you live?: Yes      Need for Family Participation in Patient Care: Yes (Comment) Care giver support system in place?: No (comment) Current home services: DME Criminal Activity/Legal Involvement Pertinent to Current Situation/Hospitalization: No - Comment as needed  Activities of Daily Living      Permission Sought/Granted Permission sought to share information with : Case Manager, Customer service manager Permission granted to share information with : Yes, Verbal Permission Granted     Permission granted to share info w AGENCY: SNF's        Emotional Assessment Appearance:: Appears older than stated age, Disheveled Attitude/Demeanor/Rapport: Engaged Affect (typically observed): Accepting Orientation: : Oriented to Self, Oriented to Place, Oriented to  Time, Oriented to Situation Alcohol / Substance Use: Alcohol Use Psych Involvement: No (comment)  Admission diagnosis:  etoh Patient Active Problem List   Diagnosis Date Noted   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   Generalized weakness 03/18/2022   Nondisplaced fracture of shaft of left clavicle, initial encounter for closed fracture 03/18/2022   BPH (benign prostatic hyperplasia) 03/18/2022   Clavicle fracture 03/17/2022   Fall 03/17/2022   CAP (community  acquired pneumonia) 03/13/2022   Altered mental status, unspecified 02/20/2022   Hypotension 02/19/2022   Dysphonia 08/13/2021   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   Paresthesia of right upper extremity 08/13/2021   SIRS (systemic inflammatory response syndrome) ([place]) 08/05/2020   GERD (gastroesophageal reflux disease) 06/05/2020   Alcohol use disorder, severe, dependence ([name]) 05/28/2020   Paroxysmal A-fib ([place]) 03/17/2020   Primary insomnia 02/06/2020   Left  inguinal hernia 01/31/2020   Encounter for competency evaluation    Pancytopenia ([place])    Alcoholic cirrhosis of liver without ascites ([place])    Thrombocytopenia ([place])    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[place]) 09/27/2019   Alcohol withdrawal delirium, acute, hyperactive ([place]) 09/21/2019   Pressure injury of ankle, stage 1 08/15/2019   Palliative care by specialist    Alcohol withdrawal ([place]) 03/20/2019   Closed fracture of right proximal humerus 03/19/2019   Vitamin D deficiency 01/11/2019   Osteoporosis 12/22/2018   Closed nondisplaced fracture of acromial process of right scapula with routine healing 11/15/2018   Compression fracture of L2 vertebra with routine healing 11/15/2018   Delirium tremens ([place]) 03/08/2018   HTN (hypertension) 09/16/2017   Encephalopathy, 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) 06/17/2016   Anxiety 07/16/2015   H/O traumatic brain injury 05/22/2014   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[place]) 05/22/2014   PCP:  [facility] [place]:   [facility] [address] [place] [address] Phone: [phone] Fax: [phone]     Social Determinants of Health (SDOH) Interventions    Readmission Risk Interventions    03/14/2022   12:48 PM 08/23/2020   11:52 AM 08/19/2020    1:32 PM  Readmission Risk Prevention Plan  Transportation Screening Complete Complete Complete  PCP or Specialist Appt within 3-5 Days Complete    HRI or [place] Complete    Social Work Consult for [place] Planning/Counseling Complete    Palliative Care Screening Not Applicable    Medication Review Press photographer) Complete Complete Complete  PCP or Specialist appointment within 3-5 days of discharge   Complete  HRI or [place]  Complete   SW Recovery Care/Counseling Consult   Complete   Palliative Care Screening   Not [place]  Not Applicable

## 2022-04-21 NOTE — NC FL2 (Signed)
[place] LEVEL OF CARE SCREENING TOOL     IDENTIFICATION  Patient Name: Douglas Peterson. Birthdate: 21-Sep-1951 Sex: male Admission Date (Current Location): 04/20/2022  [place] and [place] Number:  Engineering geologist and Address:  [facility], [address] [place], [place] [address]      Provider Number: [id number]  Attending Physician Name and Address:  No att. providers found  Relative Name and Phone Number:       Current Level of Care: Other (Comment) Recommended Level of Care: [place] Prior Approval Number:    Date Approved/Denied:   PASRR Number: [id number]  Discharge Plan: SNF    Current Diagnoses: Patient Active Problem List   Diagnosis Date Noted   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   Generalized weakness 03/18/2022   Nondisplaced fracture of shaft of left clavicle, initial encounter for closed fracture 03/18/2022   BPH (benign prostatic hyperplasia) 03/18/2022   Clavicle fracture 03/17/2022   Fall 03/17/2022   CAP (community acquired pneumonia) 03/13/2022   Altered mental status, unspecified 02/20/2022   Hypotension 02/19/2022   Dysphonia 08/13/2021   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   Paresthesia of right upper extremity 08/13/2021   SIRS (systemic inflammatory response syndrome) (HCC) 08/05/2020   GERD (gastroesophageal reflux disease) 06/05/2020   Alcohol use disorder, severe, dependence ([place]) 05/28/2020   Paroxysmal A-fib ([place]) 03/17/2020   Primary insomnia 02/06/2020   Left inguinal hernia 01/31/2020   Encounter for competency evaluation    Pancytopenia ([place])    Alcoholic cirrhosis of liver without ascites ([place])    Thrombocytopenia ([place])    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[place]) 09/27/2019   Alcohol withdrawal delirium, acute, hyperactive ([place]) 09/21/2019   Pressure injury of ankle, stage 1 08/15/2019   Palliative care by specialist    Alcohol  withdrawal ([place]) 03/20/2019   Closed fracture of right proximal humerus 03/19/2019   Vitamin D deficiency 01/11/2019   Osteoporosis 12/22/2018   Closed nondisplaced fracture of acromial process of right scapula with routine healing 11/15/2018   Compression fracture of L2 vertebra with routine healing 11/15/2018   Delirium tremens ([place]) 03/08/2018   HTN (hypertension) 09/16/2017   Encephalopathy, 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[place]) 02/27/2017   Steatohepatitis 12/03/2016   Abnormal liver enzymes 06/17/2016   Hereditary hemochromatosis ([place]) 06/17/2016   Anxiety 07/16/2015   H/O traumatic brain injury 05/22/2014   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[place]) 05/22/2014    Orientation RESPIRATION BLADDER Height & Weight     Self, Time, Situation, Place  Normal Continent Weight: 64.4 kg Height:  5' 3"  (160 cm)  BEHAVIORAL SYMPTOMS/MOOD NEUROLOGICAL BOWEL NUTRITION STATUS      Incontinent Diet (Regular)  AMBULATORY STATUS COMMUNICATION OF NEEDS Skin   Limited Assist Verbally Normal                       Personal Care Assistance Level of Assistance  Bathing, Feeding, Dressing Bathing Assistance: Limited assistance Feeding assistance: Limited assistance Dressing Assistance: Limited assistance     Functional Limitations Info  Sight, Hearing, Speech Sight Info: Adequate Hearing Info: Adequate Speech Info: Adequate    SPECIAL CARE FACTORS FREQUENCY  PT (By licensed PT), OT (By licensed OT)     PT Frequency: 5 times per week OT Frequency: 5 times per week            Contractures Contractures Info: Present (Right arm)    Additional Factors Info  Code Status, Allergies Code Status  Info: Full Allergies Info: hydrochlorothiazide           Current Medications (04/21/2022):  This is the current hospital active medication list Current Facility-Administered Medications  Medication Dose Route Frequency Provider Last Rate Last Admin   folic acid (FOLVITE) tablet 1 mg  1 mg Oral Daily Bradler,  Vista Lawman, MD       LORazepam (ATIVAN) tablet 1-4 mg  1-4 mg Oral Q1H PRN Naaman Plummer, MD       Or   LORazepam (ATIVAN) injection 1-4 mg  1-4 mg Intravenous Q1H PRN Naaman Plummer, MD       metoCLOPramide (REGLAN) injection 10 mg  10 mg Intravenous Once Vladimir Crofts, MD       multivitamin with minerals tablet 1 tablet  1 tablet Oral Daily Bradler, Vista Lawman, MD       thiamine tablet 100 mg  100 mg Oral Daily Bradler, Vista Lawman, MD       Or   thiamine (B-1) injection 100 mg  100 mg Intravenous Daily Naaman Plummer, MD       Current Outpatient Medications  Medication Sig Dispense Refill   acetaminophen (TYLENOL) 500 MG tablet Take 1 tablet (500 mg total) by mouth every 8 (eight) hours as needed for mild pain or fever. 15 tablet 0   Ascorbic Acid (VITAMIN C) 1000 MG tablet Take 1 tablet (1,000 mg total) by mouth daily. 30 tablet 0   aspirin EC 81 MG tablet Take 1 tablet (81 mg total) by mouth daily. Swallow whole. 30 tablet 0   atenolol (TENORMIN) 25 MG tablet Take 1 tablet (25 mg total) by mouth daily. 30 tablet 1   atorvastatin (LIPITOR) 10 MG tablet Take 1 tablet (10 mg total) by mouth daily. 30 tablet 0   baclofen (LIORESAL) 10 MG tablet Take 1 tablet (10 mg total) by mouth 3 (three) times daily. 30 each 0   Cholecalciferol 125 MCG (5000 UT) TABS Take 1 tablet (5,000 Units total) by mouth daily. 30 tablet 0   doxepin (SINEQUAN) 10 MG capsule Take 1 capsule (10 mg total) by mouth at bedtime as needed (sleep). 30 capsule 0   feeding supplement (ENSURE ENLIVE / ENSURE PLUS) LIQD Take 237 mLs by mouth 2 (two) times daily between meals. 836 mL 12   folic acid (FOLVITE) 1 MG tablet Take 1 tablet (1 mg total) by mouth daily. 30 tablet 0   hydrOXYzine (ATARAX/VISTARIL) 25 MG tablet Take 1 tablet (25 mg total) by mouth 3 (three) times daily as needed for anxiety or itching. 30 tablet 0   Multiple Vitamin (MULTIVITAMIN WITH MINERALS) TABS tablet Take 1 tablet by mouth daily. 30 tablet 0   omeprazole  (PRILOSEC) 10 MG capsule Take 10 mg by mouth daily.     ondansetron (ZOFRAN) 4 MG tablet Take 4 mg by mouth every 8 (eight) hours as needed.     PARoxetine (PAXIL) 30 MG tablet Take 1 tablet (30 mg total) by mouth daily. 30 tablet 0   polyethylene glycol powder (GLYCOLAX/MIRALAX) 17 GM/SCOOP powder Take 17 g by mouth daily. (Patient taking differently: Take 17 g by mouth daily as needed.) 255 g 0   rivaroxaban (XARELTO) 20 MG TABS tablet Take 1 tablet (20 mg total) by mouth daily with supper. 30 tablet 2   senna-docusate (SENOKOT-S) 8.6-50 MG tablet Take 2 tablets by mouth at bedtime. (Patient taking differently: Take 2 tablets by mouth at bedtime as needed.) 30 tablet 1  tamsulosin (FLOMAX) 0.4 MG CAPS capsule Take 0.4 mg by mouth daily.     thiamine 100 MG tablet Take 1 tablet (100 mg total) by mouth daily. 30 tablet 0   tiZANidine (ZANAFLEX) 4 MG tablet Take 8 mg by mouth 2 (two) times daily.       Discharge Medications: Please see discharge summary for a list of discharge medications.  Relevant Imaging Results:  Relevant Lab Results:   Additional Information SS# [id number]  Shelbie Hutching, RN

## 2022-04-22 DIAGNOSIS — D6959 Other secondary thrombocytopenia: Secondary | ICD-10-CM | POA: Diagnosis not present

## 2022-04-22 DIAGNOSIS — R040 Epistaxis: Secondary | ICD-10-CM | POA: Diagnosis not present

## 2022-04-22 NOTE — Progress Notes (Signed)
Physical Therapy Treatment Patient Details Name: Douglas Peterson. MRN: [id number] DOB: 03-11-1951 Today's Date: 04/22/2022   History of Present Illness Pt is a 71 yo M who presents to the ED intoxicated complaining of nausea/vomiting/diarrhea over the past 5 days with poor p.o. intake.PMH ETOH abuse with frequent aqdmissions in 2022 & 2021, AF, HTN, remote brain injury with chronic spastic hemiplegia. MD assessment inlcudes Acute alcoholic intoxication without complication ([place])  and Weakness    PT Comments    Patient agreeable to PT and requesting to get out of bed. He is progressing towards meeting PT goals, however continues to require physical assistance with all mobility tasks. Moderate assistance required for bed mobility and to perform 3 sit to stand transfers. He ambulated 28 ft with moderate assistance with impaired gait pattern and was fatigued with increasing tremulous RLE with ambulation. He has decreased awareness of physical limitations and overestimates his abilities. He is not at his baseline level of functional mobility. SNF is the safest discharge plan at this time. PT will continue to follow to maximize independence and decrease caregiver burden.    Recommendations for follow up therapy are one component of a multi-disciplinary discharge planning process, led by the attending physician.  Recommendations may be updated based on patient status, additional functional criteria and insurance authorization.  Follow Up Recommendations  Skilled nursing-short term rehab (<3 hours/day)     Assistance Recommended at Discharge Intermittent Supervision/Assistance  Patient can return home with the following Help with stairs or ramp for entrance;A lot of help with bathing/dressing/bathroom;Assistance with cooking/housework;Assist for transportation;A lot of help with walking and/or transfers   Equipment Recommendations  None recommended by PT    Recommendations for Other Services        Precautions / Restrictions Precautions Precautions: Fall Restrictions Weight Bearing Restrictions: No     Mobility  Bed Mobility Overal bed mobility: Needs Assistance Bed Mobility: Supine to Sit, Sit to Supine     Supine to sit: Mod assist Sit to supine: Mod assist   General bed mobility comments: assistance for LE and trunk support. cues for technique    Transfers Overall transfer level: Needs assistance Equipment used: 1 person hand held assist Transfers: Sit to/from Stand Sit to Stand: Mod assist           General transfer comment: 3 bouts of standing performed from bed. verbal cues for technique    Ambulation/Gait Ambulation/Gait assistance: Mod assist Gait Distance (Feet): 28 Feet Assistive device: Rolling walker (2 wheels) (no cane available, therapist assisted with R side navigation of rolling walker as patient unable to grip due baseline hemiplegia) Gait Pattern/deviations: Step-to pattern, Decreased stance time - right, Decreased step length - right, Decreased stride length Gait velocity: decreased     General Gait Details: patient insistent on ambulating. patient required moderate assistance for steadying, for weight shifting to left for advancement of RLE which remained externally rotated with no heel strike. patient was tremulous with increased gait distance and unable to ambulate further due to weakness and fatigue. gait pattern worsened with increased activity. he will need a SPC vs hemi walker   Stairs             Wheelchair Mobility    Modified Rankin (Stroke Patients Only)       Balance           Standing balance support: Single extremity supported Standing balance-Leahy Scale: Fair Standing balance comment: improved standing balance  Cognition Arousal/Alertness: Awake/alert Behavior During Therapy: WFL for tasks assessed/performed Overall Cognitive Status: Within Functional Limits for tasks  assessed                         Following Commands: Follows one step commands with increased time, Follows one step commands inconsistently Safety/Judgement: Decreased awareness of safety, Decreased awareness of deficits     General Comments: patient is able to follow single step commands consistently. he has decreased awareness of need for physical assistance and his own limitations        Exercises      General Comments        Pertinent Vitals/Pain Pain Assessment Pain Assessment: No/denies pain    Home Living                          Prior Function            PT Goals (current goals can now be found in the care plan section) Acute Rehab PT Goals Patient Stated Goal: none stated PT Goal Formulation: With patient Time For Goal Achievement: 05/04/22 Potential to Achieve Goals: Fair Progress towards PT goals: Progressing toward goals    Frequency    Min 2X/week      PT Plan Current plan remains appropriate    Co-evaluation              AM-PAC PT "6 Clicks" Mobility   Outcome Measure  Help needed turning from your back to your side while in a flat bed without using bedrails?: A Lot Help needed moving from lying on your back to sitting on the side of a flat bed without using bedrails?: A Lot Help needed moving to and from a bed to a chair (including a wheelchair)?: A Lot Help needed standing up from a chair using your arms (e.g., wheelchair or bedside chair)?: A Lot Help needed to walk in hospital room?: A Lot Help needed climbing 3-5 steps with a railing? : Total 6 Click Score: 11    End of Session Equipment Utilized During Treatment: Gait belt Activity Tolerance: Patient tolerated treatment well;Patient limited by fatigue Patient left: in bed;with call bell/phone within reach;with bed alarm set Nurse Communication: Mobility status PT Visit Diagnosis: Unsteadiness on feet (R26.81);Muscle weakness (generalized) (M62.81);Repeated  falls (R29.6);Difficulty in walking, not elsewhere classified (R26.2)     Time: 5701-7793 PT Time Calculation (min) (ACUTE ONLY): 24 min  Charges:  $Gait Training: 8-22 mins $Therapeutic Activity: 8-22 mins                     Minna Merritts, PT, MPT    Percell Locus 04/22/2022, 3:54 PM

## 2022-04-22 NOTE — ED Notes (Signed)
Pt in bed with eyes closed, resps even and unlabored.

## 2022-04-22 NOTE — TOC Transition Note (Signed)
Transition of Care (TOC) - CM/SW Discharge Note   Patient Details  Name: Douglas Centanni Jr. MRN: [id number] Date of Birth: 11/10/1950  Transition of Care (TOC) CM/SW Contact:   M , RN Phone Number: 04/22/2022, 4:25 PM   Clinical Narrative:     Met with patient at the bedside and explained to him that [facility] could not offer a bed at this time.  Patient declines rehab in [place] and chooses to go home instead.  Patient has called [facility] and they are on their way out to his home to open the doors since the patient's keys are inside the house and no one is available to open the door.  ED secretary has called for EMS transport to get patient home.  [facility] will try again to reach out to the patient to open him for services, new home health orders have been placed by MD.    Final next level of care: Home w Home Health Services Barriers to Discharge: Barriers Resolved   Patient Goals and CMS Choice Patient states their goals for this hospitalization and ongoing recovery are:: patient does not want to go to [place] for rehab he says he will go home instead CMS Medicare.gov Compare Post Acute Care list provided to:: Patient Choice offered to / list presented to : Patient  Discharge Placement                Patient to be transferred to facility by: EMS to transport home      Discharge Plan and Services   Discharge Planning Services: CM Consult Post Acute Care Choice: Skilled Nursing Facility          DME Arranged: N/A DME Agency: NA       HH Arranged: RN, PT, OT, Social Work HH Agency: [facility] Date HH Agency Contacted: 04/22/22 Time HH Agency Contacted: 1623 Representative spoke with at HH Agency: Cheryl Rose  Social Determinants of Health (SDOH) Interventions     Readmission Risk Interventions    03/14/2022   12:48 PM 08/23/2020   11:52 AM 08/19/2020    1:32 PM  Readmission Risk Prevention Plan   Transportation Screening Complete Complete Complete  PCP or Specialist Appt within 3-5 Days Complete    HRI or Home Care Consult Complete    Social Work Consult for Recovery Care Planning/Counseling Complete    Palliative Care Screening Not Applicable    Medication Review (RN Care Manager) Complete Complete Complete  PCP or Specialist appointment within 3-5 days of discharge   Complete  HRI or Home Care Consult  Complete   SW Recovery Care/Counseling Consult   Complete  Palliative Care Screening   Not Applicable  Skilled Nursing Facility  Not Applicable        

## 2022-04-22 NOTE — ED Notes (Signed)
Pt states that he can't go home, md notified, md at bedside, per md, sw will come and talk with pt.

## 2022-04-22 NOTE — ED Notes (Signed)
Pt sitting up in bed, pt states that he doesn't want to go home anymore, states that if he spends one more night in the er he will feel better, states that he only wanted to go home because he didn't want to go to the group home.  Pt answering questions appropriately.

## 2022-04-22 NOTE — Evaluation (Signed)
Occupational Therapy Evaluation Patient Details Name: Douglas Peterson. MRN: [id number] DOB: 17-Apr-1951 Today's Date: 04/22/2022   History of Present Illness Pt is a 71 yo M who presents to the ED intoxicated complaining of nausea/vomiting/diarrhea over the past 5 days with poor p.o. intake.PMH ETOH abuse with frequent aqdmissions in 2022 & 2021, AF, HTN, remote brain injury with chronic spastic hemiplegia. MD assessment inlcudes Acute alcoholic intoxication without complication ([place])  and Weakness   Clinical Impression   Douglas Peterson presents with generalized weakness, impaired balance, reduced RUE ROM and limited endurance. During today's evaluation, he appears to be far from his baseline level of fxl mobility. Pt reports that normally he gets out of his house almost every day, drives, shops, and manages his own ADL/IADL. Today, however, he requires Min-Mod A for bed mobility, transfers, dressing, and maintaining balance. He displays b/l UE and b/l LE tremors; has very limited use of his RUE, and presents with poor static standing balance. Recommend DC to SNF, to allow pt to reduce falls risks; improve strength, balance, and ROM; and return to PLOF.   Recommendations for follow up therapy are one component of a multi-disciplinary discharge planning process, led by the attending physician.  Recommendations may be updated based on patient status, additional functional criteria and insurance authorization.   Follow Up Recommendations  Skilled nursing-short term rehab (<3 hours/day)    Assistance Recommended at Discharge Frequent or constant Supervision/Assistance  Patient can return home with the following A lot of help with walking and/or transfers;A lot of help with bathing/dressing/bathroom;Assistance with cooking/housework;Help with stairs or ramp for entrance    Functional Status Assessment  Patient has had a recent decline in their functional status and demonstrates the ability to make  significant improvements in function in a reasonable and predictable amount of time.  Equipment Recommendations       Recommendations for Other Services       Precautions / Restrictions Precautions Precautions: Fall Restrictions Weight Bearing Restrictions: No LUE Weight Bearing: Weight bearing as tolerated      Mobility Bed Mobility Overal bed mobility: Needs Assistance Bed Mobility: Supine to Sit, Sit to Supine     Supine to sit: Mod assist Sit to supine: Mod assist   General bed mobility comments: Requires Mod A for trunk control, scooting towards EOB    Transfers Overall transfer level: Needs assistance Equipment used: 1 person hand held assist Transfers: Sit to/from Stand Sit to Stand: Mod assist     Step pivot transfers: Mod assist     General transfer comment: significant posterior lean      Balance Overall balance assessment: Needs assistance Sitting-balance support: Single extremity supported, Feet supported Sitting balance-Leahy Scale: Fair   Postural control: Posterior lean, Right lateral lean Standing balance support: Single extremity supported, During functional activity Standing balance-Leahy Scale: Poor Standing balance comment: Pt unable to maintain standing balance INDly                           ADL either performed or assessed with clinical judgement   ADL Overall ADL's : Needs assistance/impaired                 Upper Body Dressing : Minimal assistance Upper Body Dressing Details (indicate cue type and reason): for donning hospital gown                         Vision  Perception     Praxis      Pertinent Vitals/Pain Pain Assessment Pain Assessment: No/denies pain     Hand Dominance Left   Extremity/Trunk Assessment Upper Extremity Assessment Upper Extremity Assessment: Generalized weakness;RUE deficits/detail RUE Deficits / Details: contracture   Lower Extremity Assessment Lower  Extremity Assessment: Generalized weakness       Communication Communication Communication: No difficulties   Cognition Arousal/Alertness: Awake/alert Behavior During Therapy: WFL for tasks assessed/performed Overall Cognitive Status: Within Functional Limits for tasks assessed                         Following Commands: Follows one step commands with increased time, Follows one step commands inconsistently             General Comments       Exercises Other Exercises Other Exercises: Educ re: PoC, housing options, DC recs   Shoulder Instructions      Home Living Family/patient expects to be discharged to:: Private residence Living Arrangements: Alone Available Help at Discharge: Available PRN/intermittently;Family Type of Home: House Home Access: Stairs to enter [employer] of Steps: 1 Entrance Stairs-Rails: Can reach both Home Layout: One level     Bathroom Shower/Tub: Teacher, early years/pre: Standard     Home Equipment: Conservation officer, nature (2 wheels);Cane - quad;BSC/3in1;Shower seat;Cane - single point          Prior Functioning/Environment Prior Level of Function : Independent/Modified Independent;Driving             Mobility Comments: Pt reports he walks with can ADLs Comments: Pt reports he is IND in ADL, drives, does his own shopping and cleaning        OT Problem List: Decreased strength;Decreased activity tolerance;Impaired balance (sitting and/or standing);Decreased safety awareness;Impaired UE functional use;Impaired tone      OT Treatment/Interventions:      OT Goals(Current goals can be found in the care plan section) Acute Rehab OT Goals Patient Stated Goal: to go home OT Goal Formulation: With patient Time For Goal Achievement: 05/06/22  OT Frequency:      Co-evaluation              AM-PAC OT "6 Clicks" Daily Activity     Outcome Measure Help from another person eating meals?: None Help from  another person taking care of personal grooming?: A Little Help from another person toileting, which includes using toliet, bedpan, or urinal?: A Lot Help from another person bathing (including washing, rinsing, drying)?: A Lot Help from another person to put on and taking off regular upper body clothing?: A Little Help from another person to put on and taking off regular lower body clothing?: A Lot 6 Click Score: 16   End of Session    Activity Tolerance: Patient tolerated treatment well Patient left: in bed;with call bell/phone within reach  OT Visit Diagnosis: Unsteadiness on feet (R26.81);Muscle weakness (generalized) (M62.81);Hemiplegia and hemiparesis Hemiplegia - Right/Left: Right                Time: 9833-8250 OT Time Calculation (min): 18 min Charges:  OT General Charges $OT Visit: 1 Visit OT Evaluation $OT Eval Moderate Complexity: 1 Mod OT Treatments $Self Care/Home Management : 8-22 mins Josiah Lobo, PhD, MS, OTR/L 04/22/22, 10:44 AM

## 2022-04-22 NOTE — ED Provider Notes (Signed)
On my rounding today patient is denying any acute complaints.  I discussed patient's status with social worker states that patient is refusing to go to SNF which PT and OT had recommended.  He is requesting to go home.  He does have some baseline status at home but per social worker he has a cane he can use to get home.  As he was refusing SNF placement discharge back home.    Lucrezia Starch, MD 04/22/22 [phone]

## 2022-04-22 NOTE — ED Notes (Signed)
Pt in bed, pt resps are even and unlabored, pt requests a baclofen for his R arm pain, pt oriented to person and place, re oriented pt.  Pt reports some anxiety, ativan given.

## 2022-04-22 NOTE — ED Notes (Signed)
EMS called to transport home

## 2022-04-22 NOTE — TOC Progression Note (Addendum)
Transition of Care (TOC) - Progression Note    Patient Details  Name: Douglas Peterson. MRN: [id number] Date of Birth: 1951-09-10  Transition of Care [facility]) CM/SW Contact  Shelbie Hutching, RN Phone Number: 04/22/2022, 3:18 PM  Clinical Narrative:    Patient has been accepted by [facility] and rehab, they will start [employer] authorization.  [facility] has declined to offer a bed this time.     Expected Discharge Plan: Skilled Nursing Facility Barriers to Discharge: SNF Pending bed offer  Expected Discharge Plan and Services Expected Discharge Plan: [place]   Discharge Planning Services: CM Consult Post Acute Care Choice: [place] Living arrangements for the past 2 months: Single Family Home                 DME Arranged: N/A DME Agency: NA                   Social Determinants of Health (SDOH) Interventions    Readmission Risk Interventions    03/14/2022   12:48 PM 08/23/2020   11:52 AM 08/19/2020    1:32 PM  Readmission Risk Prevention Plan  Transportation Screening Complete Complete Complete  PCP or Specialist Appt within 3-5 Days Complete    HRI or [place] Complete    Social Work Consult for [place] Planning/Counseling Complete    Palliative Care Screening Not Applicable    Medication Review [employer]) Complete Complete Complete  PCP or Specialist appointment within 3-5 days of discharge   Complete  HRI or [place]  Complete   SW Recovery Care/Counseling Consult   Complete  [place]  Not Applicable

## 2022-04-22 NOTE — ED Provider Notes (Signed)
-----------------------------------------   6:40 AM on 04/22/2022 -----------------------------------------   Blood pressure 104/64, pulse 74, temperature 98.8 F (37.1 C), temperature source Oral, resp. rate 15, height 5' 3"  (1.6 m), weight 64.4 kg, SpO2 95 %.  The patient is calm and cooperative at this time.  There have been no acute events since the last update.  Awaiting disposition plan from Social Work team.   Paulette Blanch, MD 04/22/22 [phone]

## 2022-04-22 NOTE — ED Notes (Signed)
Pt up to a chair, pt finished his meal tray, pt requests ice cream, ice cream given.

## 2022-04-22 NOTE — ED Notes (Signed)
Pt at bedside eval pt

## 2022-04-22 NOTE — ED Notes (Signed)
Pt states that he is ready to go home, pt from dpt via ems

## 2022-04-23 ENCOUNTER — Other Ambulatory Visit: Payer: Self-pay

## 2022-04-23 ENCOUNTER — Inpatient Hospital Stay
Admission: EM | Admit: 2022-04-23 | Discharge: 2022-04-29 | DRG: 813 | Disposition: A | Discharge status: Home Health | Payer: Medicare HMO | Attending: Hospitalist | Admitting: Hospitalist

## 2022-04-23 ENCOUNTER — Emergency Department: Payer: Medicare HMO

## 2022-04-23 ENCOUNTER — Encounter: Payer: Self-pay | Admitting: Emergency Medicine

## 2022-04-23 DIAGNOSIS — I10 Essential (primary) hypertension: Secondary | ICD-10-CM | POA: Diagnosis present

## 2022-04-23 DIAGNOSIS — Z7901 Long term (current) use of anticoagulants: Secondary | ICD-10-CM

## 2022-04-23 DIAGNOSIS — R531 Weakness: Secondary | ICD-10-CM | POA: Diagnosis not present

## 2022-04-23 DIAGNOSIS — F10930 Alcohol use, unspecified with withdrawal, uncomplicated: Secondary | ICD-10-CM

## 2022-04-23 DIAGNOSIS — R040 Epistaxis: Secondary | ICD-10-CM | POA: Diagnosis present

## 2022-04-23 DIAGNOSIS — I480 Paroxysmal atrial fibrillation: Secondary | ICD-10-CM | POA: Diagnosis present

## 2022-04-23 DIAGNOSIS — Z79899 Other long term (current) drug therapy: Secondary | ICD-10-CM

## 2022-04-23 DIAGNOSIS — Z8249 Family history of ischemic heart disease and other diseases of the circulatory system: Secondary | ICD-10-CM

## 2022-04-23 DIAGNOSIS — F10939 Alcohol use, unspecified with withdrawal, unspecified: Secondary | ICD-10-CM | POA: Diagnosis present

## 2022-04-23 DIAGNOSIS — F32A Depression, unspecified: Secondary | ICD-10-CM | POA: Diagnosis present

## 2022-04-23 DIAGNOSIS — N400 Enlarged prostate without lower urinary tract symptoms: Secondary | ICD-10-CM | POA: Diagnosis present

## 2022-04-23 DIAGNOSIS — Z87891 Personal history of nicotine dependence: Secondary | ICD-10-CM

## 2022-04-23 DIAGNOSIS — I951 Orthostatic hypotension: Secondary | ICD-10-CM | POA: Diagnosis present

## 2022-04-23 DIAGNOSIS — Z888 Allergy status to other drugs, medicaments and biological substances status: Secondary | ICD-10-CM

## 2022-04-23 DIAGNOSIS — F10230 Alcohol dependence with withdrawal, uncomplicated: Secondary | ICD-10-CM | POA: Diagnosis present

## 2022-04-23 DIAGNOSIS — I959 Hypotension, unspecified: Secondary | ICD-10-CM | POA: Diagnosis present

## 2022-04-23 DIAGNOSIS — Z87820 Personal history of traumatic brain injury: Secondary | ICD-10-CM

## 2022-04-23 DIAGNOSIS — D6959 Other secondary thrombocytopenia: Secondary | ICD-10-CM | POA: Diagnosis not present

## 2022-04-23 DIAGNOSIS — R5381 Other malaise: Secondary | ICD-10-CM | POA: Diagnosis present

## 2022-04-23 DIAGNOSIS — F1020 Alcohol dependence, uncomplicated: Secondary | ICD-10-CM | POA: Diagnosis present

## 2022-04-23 DIAGNOSIS — Z7982 Long term (current) use of aspirin: Secondary | ICD-10-CM

## 2022-04-23 LAB — BASIC METABOLIC PANEL
Anion gap: 9 (ref 5–15)
BUN: 14 mg/dL (ref 8–23)
CO2: 26 mmol/L (ref 22–32)
Calcium: 8.9 mg/dL (ref 8.9–10.3)
Chloride: 99 mmol/L (ref 98–111)
Creatinine, Ser: 0.43 mg/dL — ABNORMAL LOW (ref 0.61–1.24)
GFR, Estimated: >60 mL/min (ref >60)
Glucose, Bld: 99 mg/dL (ref 70–99)
Potassium: 4.6 mmol/L (ref 3.5–5.1)
Sodium: 134 mmol/L — ABNORMAL LOW (ref 135–145)

## 2022-04-23 LAB — PROTIME-INR
INR: 1.5 — ABNORMAL HIGH (ref 0.8–1.2)
Prothrombin Time: 17.9 seconds — ABNORMAL HIGH (ref 11.4–15.2)

## 2022-04-23 LAB — URINALYSIS, ROUTINE W REFLEX MICROSCOPIC
Bilirubin Urine: NEGATIVE
Glucose, UA: NEGATIVE mg/dL
Hgb urine dipstick: NEGATIVE
Ketones, ur: 20 mg/dL — AB
Leukocytes,Ua: NEGATIVE
Nitrite: NEGATIVE
Protein, ur: NEGATIVE mg/dL
Specific Gravity, Urine: 1.016 (ref 1.005–1.030)
pH: 7 (ref 5.0–8.0)

## 2022-04-23 LAB — CBC WITH DIFFERENTIAL/PLATELET
Abs Immature Granulocytes: 0.03 10*3/uL (ref 0.00–0.07)
Basophils Absolute: 0 10*3/uL (ref 0.0–0.1)
Basophils Relative: 0 %
Eosinophils Absolute: 0.1 10*3/uL (ref 0.0–0.5)
Eosinophils Relative: 1 %
HCT: 39.9 % (ref 39.0–52.0)
Hemoglobin: 13.4 g/dL (ref 13.0–17.0)
Immature Granulocytes: 0 %
Lymphocytes Relative: 37 %
Lymphs Abs: 2.9 10*3/uL (ref 0.7–4.0)
MCH: 31.3 pg (ref 26.0–34.0)
MCHC: 33.6 g/dL (ref 30.0–36.0)
MCV: 93.2 fL (ref 80.0–100.0)
Monocytes Absolute: 0.7 10*3/uL (ref 0.1–1.0)
Monocytes Relative: 9 %
Neutro Abs: 4.2 10*3/uL (ref 1.7–7.7)
Neutrophils Relative %: 53 %
Platelets: 74 10*3/uL — ABNORMAL LOW (ref 150–400)
RBC: 4.28 MIL/uL (ref 4.22–5.81)
RDW: 11.9 % (ref 11.5–15.5)
WBC: 8 10*3/uL (ref 4.0–10.5)
nRBC: 0 % (ref 0.0–0.2)

## 2022-04-23 LAB — HEPATIC FUNCTION PANEL
ALT: 65 U/L — ABNORMAL HIGH (ref 0–44)
AST: 77 U/L — ABNORMAL HIGH (ref 15–41)
Albumin: 3.1 g/dL — ABNORMAL LOW (ref 3.5–5.0)
Alkaline Phosphatase: 68 U/L (ref 38–126)
Bilirubin, Direct: 0.3 mg/dL — ABNORMAL HIGH (ref 0.0–0.2)
Indirect Bilirubin: 0.9 mg/dL (ref 0.3–0.9)
Total Bilirubin: 1.2 mg/dL (ref 0.3–1.2)
Total Protein: 5.5 g/dL — ABNORMAL LOW (ref 6.5–8.1)

## 2022-04-23 LAB — LIPASE, BLOOD: Lipase: 23 U/L (ref 11–51)

## 2022-04-23 LAB — MAGNESIUM: Magnesium: 1.4 mg/dL — ABNORMAL LOW (ref 1.7–2.4)

## 2022-04-23 LAB — TROPONIN I (HIGH SENSITIVITY): Troponin I (High Sensitivity): 5 ng/L (ref <18)

## 2022-04-23 LAB — ETHANOL: Alcohol, Ethyl (B): <10 mg/dL (ref <10)

## 2022-04-23 MED ORDER — SODIUM CHLORIDE 0.9 % IV SOLN
250.0000 mL | INTRAVENOUS | Status: DC | PRN
Start: 2022-04-23 — End: 2022-04-29

## 2022-04-23 MED ORDER — ONDANSETRON HCL 4 MG/2ML IJ SOLN
4.0000 mg | Freq: Four times a day (QID) | INTRAMUSCULAR | Status: DC | PRN
  Administered 2022-04-25 (×2): 4 mg via INTRAVENOUS
  Filled 2022-04-23 (×2): qty 2

## 2022-04-23 MED ORDER — SODIUM CHLORIDE 0.9 % IV SOLN: INTRAVENOUS | Status: AC

## 2022-04-23 MED ORDER — ATORVASTATIN CALCIUM 20 MG PO TABS
10.0000 mg | ORAL_TABLET | Freq: Every day | ORAL | Status: DC
  Administered 2022-04-24 – 2022-04-29 (×6): 10 mg via ORAL
  Filled 2022-04-23 (×6): qty 1

## 2022-04-23 MED ORDER — ACETAMINOPHEN 500 MG PO TABS
500.0000 mg | ORAL_TABLET | Freq: Three times a day (TID) | ORAL | Status: DC | PRN
  Administered 2022-04-24 – 2022-04-29 (×8): 500 mg via ORAL
  Filled 2022-04-23 (×8): qty 1

## 2022-04-23 MED ORDER — DOXEPIN HCL 10 MG PO CAPS: 10.0000 mg | ORAL_CAPSULE | Freq: Every evening | ORAL | Status: DC | PRN

## 2022-04-23 MED ORDER — THIAMINE HCL 100 MG PO TABS
100.0000 mg | ORAL_TABLET | Freq: Every day | ORAL | Status: DC
  Administered 2022-04-23 – 2022-04-29 (×7): 100 mg via ORAL
  Filled 2022-04-23 (×7): qty 1

## 2022-04-23 MED ORDER — ENSURE ENLIVE PO LIQD
237.0000 mL | Freq: Two times a day (BID) | ORAL | Status: DC
Start: 2022-04-23 — End: 2022-04-29
  Administered 2022-04-23 – 2022-04-29 (×11): 237 mL via ORAL

## 2022-04-23 MED ORDER — PANTOPRAZOLE SODIUM 40 MG PO TBEC
40.0000 mg | DELAYED_RELEASE_TABLET | Freq: Every day | ORAL | Status: DC
  Administered 2022-04-23 – 2022-04-29 (×7): 40 mg via ORAL
  Filled 2022-04-23 (×7): qty 1

## 2022-04-23 MED ORDER — MAGNESIUM SULFATE 2 GM/50ML IV SOLN
2.0000 g | Freq: Once | INTRAVENOUS | Status: AC
  Administered 2022-04-23: 2 g via INTRAVENOUS
  Filled 2022-04-23: qty 50

## 2022-04-23 MED ORDER — PAROXETINE HCL 30 MG PO TABS
30.0000 mg | ORAL_TABLET | Freq: Every day | ORAL | Status: DC
  Administered 2022-04-24 – 2022-04-29 (×6): 30 mg via ORAL
  Filled 2022-04-23 (×6): qty 1

## 2022-04-23 MED ORDER — SODIUM CHLORIDE 0.9% FLUSH
3.0000 mL | Freq: Two times a day (BID) | INTRAVENOUS | Status: DC
  Administered 2022-04-23 – 2022-04-28 (×12): 3 mL via INTRAVENOUS

## 2022-04-23 MED ORDER — ONDANSETRON HCL 4 MG PO TABS
4.0000 mg | ORAL_TABLET | Freq: Four times a day (QID) | ORAL | Status: DC | PRN
  Administered 2022-04-26: 4 mg via ORAL
  Filled 2022-04-23: qty 1

## 2022-04-23 MED ORDER — HYDROCODONE-ACETAMINOPHEN 5-325 MG PO TABS
1.0000 | ORAL_TABLET | Freq: Once | ORAL | Status: AC
  Administered 2022-04-23: 1 via ORAL
  Filled 2022-04-23: qty 1

## 2022-04-23 MED ORDER — LACTATED RINGERS IV BOLUS
1000.0000 mL | Freq: Once | INTRAVENOUS | Status: AC
  Administered 2022-04-23: 1000 mL via INTRAVENOUS

## 2022-04-23 MED ORDER — GUAIFENESIN 100 MG/5ML PO LIQD
5.0000 mL | ORAL | Status: DC | PRN
  Administered 2022-04-23 – 2022-04-24 (×2): 5 mL via ORAL
  Filled 2022-04-23 (×2): qty 10

## 2022-04-23 MED ORDER — LORAZEPAM 1 MG PO TABS
0.0000 mg | ORAL_TABLET | Freq: Four times a day (QID) | ORAL | Status: DC
  Administered 2022-04-25: 1 mg via ORAL
  Filled 2022-04-23: qty 1

## 2022-04-23 MED ORDER — MAGNESIUM SULFATE 4 GM/100ML IV SOLN
4.0000 g | Freq: Once | INTRAVENOUS | Status: AC
  Administered 2022-04-23: 4 g via INTRAVENOUS
  Filled 2022-04-23 (×3): qty 100

## 2022-04-23 MED ORDER — LORAZEPAM 2 MG/ML IJ SOLN
1.0000 mg | Freq: Once | INTRAMUSCULAR | Status: AC
Start: 2022-04-23 — End: 2022-04-23
  Administered 2022-04-23: 1 mg via INTRAVENOUS
  Filled 2022-04-23: qty 1

## 2022-04-23 MED ORDER — TAMSULOSIN HCL 0.4 MG PO CAPS
0.4000 mg | ORAL_CAPSULE | Freq: Every day | ORAL | Status: DC
  Administered 2022-04-23 – 2022-04-29 (×7): 0.4 mg via ORAL
  Filled 2022-04-23 (×7): qty 1

## 2022-04-23 MED ORDER — POLYETHYLENE GLYCOL 3350 17 GM/SCOOP PO POWD
17.0000 g | Freq: Every day | ORAL | Status: DC | PRN
  Filled 2022-04-23: qty 255

## 2022-04-23 MED ORDER — ASCORBIC ACID 500 MG PO TABS
1000.0000 mg | ORAL_TABLET | Freq: Every day | ORAL | Status: DC
  Administered 2022-04-23 – 2022-04-29 (×7): 1000 mg via ORAL
  Filled 2022-04-23 (×7): qty 2

## 2022-04-23 MED ORDER — OXYMETAZOLINE HCL 0.05 % NA SOLN
1.0000 | Freq: Once | NASAL | Status: AC
  Administered 2022-04-23: 1 via NASAL
  Filled 2022-04-23: qty 30

## 2022-04-23 MED ORDER — LORAZEPAM 1 MG PO TABS
1.0000 mg | ORAL_TABLET | ORAL | Status: DC | PRN
  Filled 2022-04-23: qty 1

## 2022-04-23 MED ORDER — ADULT MULTIVITAMIN W/MINERALS CH
1.0000 | ORAL_TABLET | Freq: Every day | ORAL | Status: DC
  Administered 2022-04-23 – 2022-04-29 (×7): 1 via ORAL
  Filled 2022-04-23 (×7): qty 1

## 2022-04-23 MED ORDER — LORAZEPAM 1 MG PO TABS: 0.0000 mg | ORAL_TABLET | Freq: Two times a day (BID) | ORAL | Status: DC

## 2022-04-23 MED ORDER — LORAZEPAM 2 MG/ML IJ SOLN: 1.0000 mg | INTRAMUSCULAR | Status: DC | PRN

## 2022-04-23 MED ORDER — ATENOLOL 25 MG PO TABS
25.0000 mg | ORAL_TABLET | Freq: Every day | ORAL | Status: DC
  Administered 2022-04-24 – 2022-04-25 (×2): 25 mg via ORAL
  Filled 2022-04-23 (×2): qty 1

## 2022-04-23 MED ORDER — SENNOSIDES-DOCUSATE SODIUM 8.6-50 MG PO TABS: 2.0000 | ORAL_TABLET | Freq: Every evening | ORAL | Status: DC | PRN

## 2022-04-23 MED ORDER — TIZANIDINE HCL 4 MG PO TABS
8.0000 mg | ORAL_TABLET | Freq: Two times a day (BID) | ORAL | Status: DC
  Administered 2022-04-23 – 2022-04-29 (×12): 8 mg via ORAL
  Filled 2022-04-23 (×7): qty 2
  Filled 2022-04-23: qty 4
  Filled 2022-04-23: qty 2
  Filled 2022-04-23: qty 4
  Filled 2022-04-23 (×3): qty 2
  Filled 2022-04-23: qty 4

## 2022-04-23 MED ORDER — FOLIC ACID 1 MG PO TABS
1.0000 mg | ORAL_TABLET | Freq: Every day | ORAL | Status: DC
  Administered 2022-04-23 – 2022-04-29 (×7): 1 mg via ORAL
  Filled 2022-04-23 (×7): qty 1

## 2022-04-23 MED ORDER — TRANEXAMIC ACID FOR EPISTAXIS
500.0000 mg | Freq: Once | TOPICAL | Status: AC
  Administered 2022-04-23: 500 mg via TOPICAL
  Filled 2022-04-23: qty 10

## 2022-04-23 MED ORDER — SODIUM CHLORIDE 0.9% FLUSH: 3.0000 mL | INTRAVENOUS | Status: DC | PRN
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
## 2022-04-23 NOTE — Progress Notes (Signed)
Admission profile updated. ?

## 2022-04-23 NOTE — ED Triage Notes (Signed)
Pt via EMS from home. Pt c/o epistaxis, states it started yesterday and has not stopped. Pt has a hx of HTN but has not taken his medication today. Bleeding is controlled at this time with nasal clip. States last drink was yesterday. Denies blood thinners. Pt is A&Ox4 and NAD.

## 2022-04-23 NOTE — Assessment & Plan Note (Signed)
Continue atenolol for rate control Hold Xarelto due to worsening thrombocytopenia and epistaxis

## 2022-04-23 NOTE — Assessment & Plan Note (Addendum)
Patient noted to have acute thrombocytopenia (157 >> 74) Hold aspirin and Xarelto due to epistaxis Monitor platelet count closely

## 2022-04-23 NOTE — Assessment & Plan Note (Deleted)
Secondary to poor nutrition from alcohol abuse We will request PT/OT evaluation TOC consult

## 2022-04-23 NOTE — Assessment & Plan Note (Signed)
Patient with a history of alcohol abuse who presents to the ER for evaluation of weakness and is noted to have tremors. Place patient on alcohol withdrawal protocol and administer lorazepam for CIWA score of 8 or greater. Place patient on MVI/thiamine/folic acid

## 2022-04-23 NOTE — Assessment & Plan Note (Addendum)
Present on admission and resolved with Afrin --rebled and RN placed gauze packing in left nostril.  Hgb 12 this morning. Patient noted to have thrombocytopenia Hold aspirin and Xarelto Monitor H&H

## 2022-04-23 NOTE — ED Notes (Signed)
Pt in with nosebleed, no injury or hx of blood thinners.

## 2022-04-23 NOTE — Assessment & Plan Note (Signed)
Continue atenolol for blood pressure control

## 2022-04-23 NOTE — Assessment & Plan Note (Addendum)
Secondary to poor nutrition from alcohol abuse Please patient on fall precautions --PT/OT rec SNF rehab

## 2022-04-23 NOTE — ED Notes (Signed)
EDP at bedside to apply topical TXA. Per provider, if not actively bleeding in 10-15 minutes, can go to floor.

## 2022-04-23 NOTE — ED Notes (Signed)
See triage note. Pt states began with nosebleed suddenly yesterday after sneezing. Still has small amount of bleeding. Nasal clamp re applied. Pt is daily drinker, states last drink was last night. R arm contracted from previous stroke.

## 2022-04-23 NOTE — Assessment & Plan Note (Signed)
Stable Continue Flomax

## 2022-04-23 NOTE — ED Notes (Signed)
Pt nose started to bleed. Dr. Charna Archer made aware and at bedside

## 2022-04-23 NOTE — ED Notes (Signed)
Provider at bedside

## 2022-04-23 NOTE — Assessment & Plan Note (Signed)
-   Supplement magnesium 

## 2022-04-23 NOTE — Assessment & Plan Note (Signed)
Stable Continue Paxil

## 2022-04-23 NOTE — ED Provider Notes (Addendum)
[facility] Provider Note    Event Date/Time   First MD Initiated Contact with Patient 04/23/22 1106     (approximate)   History   Chief Complaint Epistaxis   HPI  Douglas Peterson. is a 71 y.o. male with past medical history of hypertension, paroxysmal atrial fibrillation on Xarelto, and alcohol abuse who presents to the ED for epistaxis.  Patient reports that his nose started bleeding around 9 PM last night and has been bleeding intermittently since then.  He states that he have has been unable to stop the bleeding by using pressure at home and feels like the blood is dripping down into the back of his throat.  He denies any falls or trauma to his head, denies any associated pain.  He had been boarding in the ED up until yesterday for potential placement, had declined placement at that time and requested discharge home.  He states he has been feeling very weak since he returned home with significant difficulty walking along with diarrhea.  He denies any associated abdominal pain, nausea, vomiting, dysuria, or bloody stools.  He was being treated with Xarelto while in the hospital, states he has not taken it since leaving the hospital yesterday afternoon.     Physical Exam   Triage Vital Signs: ED Triage Vitals  Enc Vitals Group     BP 04/23/22 1000 (!) 140/94     Pulse Rate 04/23/22 1000 89     Resp 04/23/22 1000 20     Temp 04/23/22 1000 98 F (36.7 C)     Temp Source 04/23/22 1000 Oral     SpO2 04/23/22 1000 99 %     Weight 04/23/22 0956 143 lb 4.8 oz (65 kg)     Height 04/23/22 0956 5' 3"  (1.6 m)     Head Circumference --      [place] --      Pain Score 04/23/22 0956 0     Pain Loc --      Pain Edu? --      Excl. in [place]? --     Most recent vital signs: Vitals:   04/23/22 1307 04/23/22 1332  BP: (!) 173/117 (!) 163/79  Pulse: 86 72  Resp: 18 18  Temp:    SpO2: 99% 98%    Constitutional: Alert and oriented. Eyes: Conjunctivae are  normal. Head: Atraumatic. Nose: Dried blood in left nare, no active bleeding noted. Mouth/Throat: Mucous membranes are moist.  Dried blood noted in posterior oropharynx. Cardiovascular: Normal rate, regular rhythm. Grossly normal heart sounds.  2+ radial pulses bilaterally. Respiratory: Normal respiratory effort.  No retractions. Lungs CTAB. Gastrointestinal: Soft and nontender. No distention. Musculoskeletal: No lower extremity tenderness nor edema.  Neurologic:  Normal speech and language.  Contracted right upper extremity secondary to prior TBI, no new focal deficits noted.    ED Results / Procedures / Treatments   Labs (all labs ordered are listed, but only abnormal results are displayed) Labs Reviewed  CBC WITH DIFFERENTIAL/PLATELET - Abnormal; Notable for the following components:      Result Value   Platelets 74 (*)    All other components within normal limits  BASIC METABOLIC PANEL - Abnormal; Notable for the following components:   Sodium 134 (*)    Creatinine, Ser 0.43 (*)    All other components within normal limits  PROTIME-INR - Abnormal; Notable for the following components:   Prothrombin Time 17.9 (*)    INR 1.5 (*)  All other components within normal limits  URINALYSIS, ROUTINE W REFLEX MICROSCOPIC - Abnormal; Notable for the following components:   Color, Urine YELLOW (*)    APPearance CLEAR (*)    Ketones, ur 20 (*)    All other components within normal limits  HEPATIC FUNCTION PANEL - Abnormal; Notable for the following components:   Total Protein 5.5 (*)    Albumin 3.1 (*)    AST 77 (*)    ALT 65 (*)    Bilirubin, Direct 0.3 (*)    All other components within normal limits  MAGNESIUM - Abnormal; Notable for the following components:   Magnesium 1.4 (*)    All other components within normal limits  ETHANOL  LIPASE, BLOOD  TROPONIN I (HIGH SENSITIVITY)     EKG  ED ECG REPORT I, Blake Divine, the attending physician, personally viewed and  interpreted this ECG.   Date: 04/23/2022  EKG Time: 11:46  Rate: 71  Rhythm: normal sinus rhythm  Axis: Normal  Intervals:none  ST&T Change: None  RADIOLOGY CXR reviewed and interpreted by me with no infiltrate, edema, or effusion.  PROCEDURES:  Critical Care performed: No  Procedures   MEDICATIONS ORDERED IN ED: Medications  magnesium sulfate IVPB 2 g 50 mL (2 g Intravenous New Bag/Given 04/23/22 1331)  LORazepam (ATIVAN) injection 1 mg (has no administration in time range)  oxymetazoline (AFRIN) 0.05 % nasal spray 1 spray (1 spray Each Nare Given by Other 04/23/22 1115)  lactated ringers bolus 1,000 mL (0 mLs Intravenous Stopped 04/23/22 1332)     IMPRESSION / MDM / ASSESSMENT AND PLAN / ED COURSE  I reviewed the triage vital signs and the nursing notes.                              71 y.o. male with past medical history of hypertension, atrial fibrillation on Xarelto, and alcohol abuse who presents to the ED complaining of epistaxis intermittently since last night, when he had left the hospital after declining SNF placement.  Patient's presentation is most consistent with acute presentation with potential threat to life or bodily function.  Differential diagnosis includes, but is not limited to, anterior epistaxis, posterior epistaxis, anemia, electrolyte abnormality, AKI, pneumonia, UTI, aspiration, arrhythmia, ACS.  Patient chronically ill-appearing but in no acute distress, vital signs are unremarkable.  With removal of nasal clamp, patient with no active bleeding noted, dried blood noted around left nare.  Afrin was applied and we will observe patient for recurrence of bleeding, no indication for packing at this time.  Given his significant weakness and difficulty walking, we will screen labs for anemia, electrolyte abnormality, and check for infectious process.  Patient without further epistaxis following application of Afrin.  Additional labs are remarkable for  hypomagnesemia, which we will replete with IV magnesium. Patient also noted to be thrombocytopenic.  LFTs show mild transaminitis that is similar to previous.  On reassessment, patient appears somewhat tremulous with tongue fasciculations, likely developing withdrawal symptoms after being on CIWA protocol in the hospital yesterday.  He would benefit from admission for electrolyte repletion, IV fluids, and management of alcohol withdrawal.  Case discussed with hospitalist for admission.  Patient did have recurrent mild epistaxis on the left, appears to be anterior bleeding.  Afrin was readministered and TXA soaked gauze was placed in the left nare.  Given relatively mild bleeding at this time, we will hold off on platelet transfusion.  Patient  stable for admission to the floor.      FINAL CLINICAL IMPRESSION(S) / ED DIAGNOSES   Final diagnoses:  Epistaxis  Hypomagnesemia  Alcohol withdrawal syndrome without complication ([place])     Rx / DC Orders   ED Discharge Orders     None        Note:  This document was prepared using Dragon voice recognition software and may include unintentional dictation errors.   Blake Divine, MD 04/23/22 1359    Blake Divine, MD 04/23/22 1600

## 2022-04-23 NOTE — ED Notes (Signed)
Patient transported to X-ray 

## 2022-04-24 DIAGNOSIS — R531 Weakness: Secondary | ICD-10-CM | POA: Diagnosis not present

## 2022-04-24 LAB — CBC
HCT: 35.2 % — ABNORMAL LOW (ref 39.0–52.0)
Hemoglobin: 12 g/dL — ABNORMAL LOW (ref 13.0–17.0)
MCH: 31.7 pg (ref 26.0–34.0)
MCHC: 34.1 g/dL (ref 30.0–36.0)
MCV: 93.1 fL (ref 80.0–100.0)
Platelets: 70 10*3/uL — ABNORMAL LOW (ref 150–400)
RBC: 3.78 MIL/uL — ABNORMAL LOW (ref 4.22–5.81)
RDW: 11.8 % (ref 11.5–15.5)
WBC: 4.9 10*3/uL (ref 4.0–10.5)
nRBC: 0 % (ref 0.0–0.2)

## 2022-04-24 LAB — BASIC METABOLIC PANEL
Anion gap: 6 (ref 5–15)
BUN: 17 mg/dL (ref 8–23)
CO2: 28 mmol/L (ref 22–32)
Calcium: 8.5 mg/dL — ABNORMAL LOW (ref 8.9–10.3)
Chloride: 100 mmol/L (ref 98–111)
Creatinine, Ser: 0.46 mg/dL — ABNORMAL LOW (ref 0.61–1.24)
GFR, Estimated: >60 mL/min (ref >60)
Glucose, Bld: 115 mg/dL — ABNORMAL HIGH (ref 70–99)
Potassium: 3.5 mmol/L (ref 3.5–5.1)
Sodium: 134 mmol/L — ABNORMAL LOW (ref 135–145)

## 2022-04-24 LAB — MAGNESIUM: Magnesium: 2.6 mg/dL — ABNORMAL HIGH (ref 1.7–2.4)

## 2022-04-24 MED ORDER — SODIUM CHLORIDE 0.9 % IV SOLN: INTRAVENOUS | Status: AC

## 2022-04-24 MED ORDER — SODIUM CHLORIDE 0.9 % IV BOLUS
1000.0000 mL | Freq: Once | INTRAVENOUS | Status: AC
  Administered 2022-04-24: 1000 mL via INTRAVENOUS

## 2022-04-24 NOTE — NC FL2 (Cosign Needed)
[place] LEVEL OF CARE SCREENING TOOL     IDENTIFICATION  Patient Name: Douglas Peterson. Birthdate: Jul 10, 1951 Sex: male Admission Date (Current Location): 04/23/2022  [facility] and [place] Number:  Engineering geologist and Address:  [facility], [address] [place], [place] [address]      Provider Number: [id number]  Attending Physician Name and Address:  Enzo Bi, MD  Relative Name and Phone Number:  [name] (Daughter) [phone]    Current Level of Care: Hospital Recommended Level of Care: [place] Prior Approval Number:    Date Approved/Denied:   PASRR Number: [id number]  Discharge Plan: SNF    Current Diagnoses: Patient Active Problem List   Diagnosis Date Noted   Weakness 04/23/2022   Epistaxis 04/23/2022   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   Generalized weakness 03/18/2022   Nondisplaced fracture of shaft of left clavicle, initial encounter for closed fracture 03/18/2022   BPH (benign prostatic hyperplasia) 03/18/2022   Clavicle fracture 03/17/2022   Fall 03/17/2022   CAP (community acquired pneumonia) 03/13/2022   Altered mental status, unspecified 02/20/2022   Hypotension 02/19/2022   Dysphonia 08/13/2021   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   Paresthesia of right upper extremity 08/13/2021   SIRS (systemic inflammatory response syndrome) ([place]) 08/05/2020   GERD (gastroesophageal reflux disease) 06/05/2020   Alcohol use disorder, severe, dependence ([place]) 05/28/2020   Paroxysmal A-fib ([employer]) 03/17/2020   Primary insomnia 02/06/2020   Left inguinal hernia 01/31/2020   Encounter for competency evaluation    Depression 01/17/2020   Pancytopenia ([place])    Hypomagnesemia    Alcoholic cirrhosis of liver without ascites ([place])    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[place]) 09/27/2019   Alcohol withdrawal delirium, acute, hyperactive ([place]) 09/21/2019   Pressure injury of  ankle, stage 1 08/15/2019   Palliative care by specialist    Alcohol withdrawal ([place]) 03/20/2019   Closed fracture of right proximal humerus 03/19/2019   Vitamin D deficiency 01/11/2019   Osteoporosis 12/22/2018   Closed nondisplaced fracture of acromial process of right scapula with routine healing 11/15/2018   Compression fracture of L2 vertebra with routine healing 11/15/2018   Delirium tremens ([place]) 03/08/2018   HTN (hypertension) 09/16/2017   Encephalopathy, 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[place]) 02/27/2017   Steatohepatitis 12/03/2016   Abnormal liver enzymes 06/17/2016   Hereditary hemochromatosis ([place]) 06/17/2016   Anxiety 07/16/2015   H/O traumatic brain injury 05/22/2014   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[place]) 05/22/2014    Orientation RESPIRATION BLADDER Height & Weight     Self, Time, Situation, Place  Normal Incontinent, External catheter Weight: 143 lb 4.8 oz (65 kg) Height:  5' 3"  (160 cm)  BEHAVIORAL SYMPTOMS/MOOD NEUROLOGICAL BOWEL NUTRITION STATUS      Continent Diet (see discharge summary)  AMBULATORY STATUS COMMUNICATION OF NEEDS Skin   Limited Assist Verbally Other (Comment) (abbrasion scrotum and buttocks)                       Personal Care Assistance Level of Assistance  Bathing, Feeding, Dressing, Total care Bathing Assistance: Limited assistance Feeding assistance: Independent Dressing Assistance: Limited assistance Total Care Assistance: Limited assistance   Functional Limitations Info  Sight, Hearing, Speech Sight Info: Impaired Hearing Info: Impaired Speech Info: Adequate    SPECIAL CARE FACTORS FREQUENCY  PT (By licensed PT), OT (By licensed OT)     PT Frequency: min 4x weekly OT Frequency: min 4x weekly  Contractures Contractures Info: Not present    Additional Factors Info  Code Status, Allergies Code Status Info: full Allergies Info: hydrochlorothiazide           Current Medications (04/24/2022):  This is the current hospital  active medication list Current Facility-Administered Medications  Medication Dose Route Frequency Provider Last Rate Last Admin   0.9 %  sodium chloride infusion  250 mL Intravenous PRN Agbata, Tochukwu, MD       0.9 %  sodium chloride infusion   Intravenous Continuous Enzo Bi, MD 100 mL/hr at 04/24/22 1214 New Bag at 04/24/22 1214   acetaminophen (TYLENOL) tablet 500 mg  500 mg Oral Q8H PRN Agbata, Tochukwu, MD   500 mg at 04/24/22 1328   ascorbic acid (VITAMIN C) tablet 1,000 mg  1,000 mg Oral Daily Agbata, Tochukwu, MD   1,000 mg at 04/24/22 0900   atenolol (TENORMIN) tablet 25 mg  25 mg Oral Daily Agbata, Tochukwu, MD   25 mg at 04/24/22 0901   atorvastatin (LIPITOR) tablet 10 mg  10 mg Oral Daily Agbata, Tochukwu, MD   10 mg at 04/24/22 0900   doxepin (SINEQUAN) capsule 10 mg  10 mg Oral QHS PRN Agbata, Tochukwu, MD       feeding supplement (ENSURE ENLIVE / ENSURE PLUS) liquid 237 mL  237 mL Oral BID BM Agbata, Tochukwu, MD   237 mL at 28/[birth year] 7616   folic acid (FOLVITE) tablet 1 mg  1 mg Oral Daily Agbata, Tochukwu, MD   1 mg at 04/24/22 0900   guaiFENesin (ROBITUSSIN) 100 MG/5ML liquid 5 mL  5 mL Oral Q4H PRN Agbata, Tochukwu, MD   5 mL at 04/24/22 0532   LORazepam (ATIVAN) tablet 1-4 mg  1-4 mg Oral Q1H PRN Agbata, Tochukwu, MD       Or   LORazepam (ATIVAN) injection 1-4 mg  1-4 mg Intravenous Q1H PRN Agbata, Tochukwu, MD       LORazepam (ATIVAN) tablet 0-4 mg  0-4 mg Oral Q6H Agbata, Tochukwu, MD       Followed by   Derrill Memo ON 04/25/2022] LORazepam (ATIVAN) tablet 0-4 mg  0-4 mg Oral Q12H Agbata, Tochukwu, MD       multivitamin with minerals tablet 1 tablet  1 tablet Oral Daily Agbata, Tochukwu, MD   1 tablet at 04/24/22 0900   ondansetron (ZOFRAN) tablet 4 mg  4 mg Oral Q6H PRN Agbata, Tochukwu, MD       Or   ondansetron (ZOFRAN) injection 4 mg  4 mg Intravenous Q6H PRN Agbata, Tochukwu, MD       pantoprazole (PROTONIX) EC tablet 40 mg  40 mg Oral Daily Agbata, Tochukwu, MD   40 mg  at 04/24/22 0900   PARoxetine (PAXIL) tablet 30 mg  30 mg Oral Daily Agbata, Tochukwu, MD   30 mg at 04/24/22 0901   polyethylene glycol powder (GLYCOLAX/MIRALAX) container 17 g  17 g Oral Daily PRN Agbata, Tochukwu, MD       senna-docusate (Senokot-S) tablet 2 tablet  2 tablet Oral QHS PRN Agbata, Tochukwu, MD       sodium chloride flush (NS) 0.9 % injection 3 mL  3 mL Intravenous Q12H Agbata, Tochukwu, MD   3 mL at 04/24/22 0902   sodium chloride flush (NS) 0.9 % injection 3 mL  3 mL Intravenous PRN Agbata, Tochukwu, MD       tamsulosin (FLOMAX) capsule 0.4 mg  0.4 mg Oral Daily Agbata, Tochukwu, MD   0.4 mg  at 04/24/22 0900   thiamine tablet 100 mg  100 mg Oral Daily Agbata, Tochukwu, MD   100 mg at 04/24/22 0900   tiZANidine (ZANAFLEX) tablet 8 mg  8 mg Oral BID Agbata, Tochukwu, MD   8 mg at 04/24/22 0901     Discharge Medications: Please see discharge summary for a list of discharge medications.  Relevant Imaging Results:  Relevant Lab Results:   Additional Information SS# [id number]  Alberteen Sam, LCSW

## 2022-04-24 NOTE — Plan of Care (Signed)
  Problem: Clinical Measurements: Goal: Ability to maintain clinical measurements within normal limits will improve Outcome: Progressing   Problem: Clinical Measurements: Goal: Will remain free from infection Outcome: Progressing   Problem: Clinical Measurements: Goal: Diagnostic test results will improve Outcome: Progressing

## 2022-04-24 NOTE — Progress Notes (Signed)
  Progress Note   Patient: Douglas Peterson. BCW:[id number] DOB: 1951-08-30 DOA: 04/23/2022     0 DOS: the patient was seen and examined on 04/24/2022   Brief hospital course: No notes on file  Assessment and Plan: * Weakness Secondary to poor nutrition from alcohol abuse Please patient on fall precautions --PT/OT rec SNF rehab    Alcohol use disorder, severe, dependence ([place]) --CIWA  Epistaxis Present on admission and resolved with Afrin --rebled and RN placed gauze packing in left nostril.  Hgb 12 this morning. 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  Symptomatic. Plan: --NS 1L bolus f/b 100 ml/hr for 10 hours  Depression Stable Continue Paxil  Hypomagnesemia Supplement magnesium        Subjective:  Pt had nosebleed last night and RN applied gauze.  Hgb 12 this morning.  Today, pt was noted to be severely orthostatic, and symptomatic feeling like he was going to pass out.  Ordered IVF bolus f/b MIVF.   Physical Exam:  Constitutional: NAD, AAOx3 HEENT: conjunctivae and lids normal, EOMI, gauze with dried blood inside his left nostril.   CV: No cyanosis.   RESP: normal respiratory effort, on RA Extremities: No effusions, edema in BLE SKIN: warm, dry Neuro: II - XII grossly intact.   Psych: Normal mood and affect.     Data Reviewed:  Family Communication:   Disposition: Status is: Observation    Planned Discharge Destination: Skilled nursing facility    Time spent: 50 minutes  Author: Enzo Bi, MD 04/24/2022 5:56 PM  For on call review [email or web address].
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
## 2022-04-24 NOTE — TOC Initial Note (Signed)
Transition of Care (TOC) - Initial/Assessment Note    Patient Details  Name: Douglas Peterson. MRN: [id number] Date of Birth: [birth year]  Transition of Care [facility]) CM/SW Contact:    [name], RN Phone Number: 04/24/2022, 3:25 PM  Clinical Narrative:                    Patient was assessed by toc on 6/19 -  See note from that visit below "Patient came into the emergency room for weakness.  [name] a long time alcoholic, patient has been seen in the emergency room and been admitted to the hospital multiple times for alcohol related issues.  Patient does not need to be admitted, patient was requesting to be admitted and reports he is so sick.  RNCM explained that we know he does not feel good but it does not warrant a hospital admission.   Discussed options of patient going home with home health or he could private pay for short term rehab in nursing facility.  Patient does not want to private pay but he would like to see if we can get his [employer] to approve.  MD has ordered PT and OT evaluations.  If [facility] Pouch does not approve patient agrees to go home.  Becky Sax with [facility] confirms that patient was referred to them at his last discharge, they were unable to get in touch with him though.   RNCM starting SNF workup. "   Ultimately patient declined bed offer and went home with home health services through [facility].  Patient returned to the hospital  the next day.   Emailed Milinda Pointer with [facility] message below. Awaiting response  "Hi Milinda Pointer I have [name]  He was discharged 2 days ago, and is now back in the hospital  Was his competency hearing the 19th of this month? Also wanted to confirm if his case has been transferred to [facility].  If so do you have a point of contact?"  Met with patient at bedside.  He is agreeable to bed search.  Request that information not be sent to [facility].  I informed him that could potentially be our only option as it was 2 days  previously.  He still declines for information to be sent. Patient informs me that regardless of bed offers he still likely will return home      Patient Goals and CMS Choice        Expected Discharge Plan and Services                                                Prior Living Arrangements/Services                       Activities of Daily Living Home Assistive Devices/Equipment: Cane (specify quad or straight) ADL Screening (condition at time of admission) Patient's cognitive ability adequate to safely complete daily activities?: Yes Is the patient deaf or have difficulty hearing?: No Does the patient have difficulty seeing, even when wearing glasses/contacts?: No Does the patient have difficulty concentrating, remembering, or making decisions?: No Patient able to express need for assistance with ADLs?: Yes Does the patient have difficulty dressing or bathing?: No Independently performs ADLs?: Yes (appropriate for developmental age) Does the patient have difficulty walking or climbing stairs?: Yes Weakness of Legs: Both  Weakness of Arms/Hands: Both  Permission Sought/Granted                  Emotional Assessment              Admission diagnosis:  Epistaxis [R04.0] Hypomagnesemia [E83.42] Weakness [R53.1] Alcohol withdrawal syndrome without complication (HCC) [F10.930] Patient Active Problem List   Diagnosis Date Noted   Weakness 04/23/2022   Epistaxis 04/23/2022   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   Generalized weakness 03/18/2022   Nondisplaced fracture of shaft of left clavicle, initial encounter for closed fracture 03/18/2022   BPH (benign prostatic hyperplasia) 03/18/2022   Clavicle fracture 03/17/2022   Fall 03/17/2022   CAP (community acquired pneumonia) 03/13/2022   Altered mental status, unspecified 02/20/2022   Hypotension 02/19/2022   Dysphonia 08/13/2021   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   Paresthesia of  right upper extremity 08/13/2021   SIRS (systemic inflammatory response syndrome) (HCC) 08/05/2020   GERD (gastroesophageal reflux disease) 06/05/2020   Alcohol use disorder, severe, dependence (HCC) 05/28/2020   Paroxysmal A-fib (HCC) 03/17/2020   Primary insomnia 02/06/2020   Left inguinal hernia 01/31/2020   Encounter for competency evaluation    Depression 01/17/2020   Pancytopenia (HCC)    Hypomagnesemia    Alcoholic cirrhosis of liver without ascites (HCC)    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) 09/27/2019   Alcohol withdrawal delirium, acute, hyperactive (HCC) 09/21/2019   Pressure injury of ankle, stage 1 08/15/2019   Palliative care by specialist    Alcohol withdrawal (HCC) 03/20/2019   Closed fracture of right proximal humerus 03/19/2019   Vitamin D deficiency 01/11/2019   Osteoporosis 12/22/2018   Closed nondisplaced fracture of acromial process of right scapula with routine healing 11/15/2018   Compression fracture of L2 vertebra with routine healing 11/15/2018   Delirium tremens (HCC) 03/08/2018   HTN (hypertension) 09/16/2017   Encephalopathy, 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   Steatohepatitis 12/03/2016   Abnormal liver enzymes 06/17/2016   Hereditary hemochromatosis (HCC) 06/17/2016   Anxiety 07/16/2015   H/O traumatic brain injury 05/22/2014   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HCC) 05/22/2014   PCP:  [facility]:   [facility] [address] [place] - [address] [place] [address] Phone: [phone] Fax: [phone]     Social Determinants of Health (SDOH) Interventions    Readmission Risk Interventions    03/14/2022   12:48 PM 08/23/2020   11:52 AM 08/19/2020    1:32 PM  Readmission Risk Prevention Plan  Transportation Screening Complete Complete Complete  PCP or Specialist Appt within 3-5 Days Complete    HRI or Home Care Consult Complete    Social Work Consult for  Recovery Care Planning/Counseling Complete    Palliative Care Screening Not Applicable    Medication Review Oceanographer) Complete Complete Complete  PCP or Specialist appointment within 3-5 days of discharge   Complete  HRI or Home Care Consult  Complete   SW Recovery Care/Counseling Consult   Complete  Palliative Care Screening   Not Applicable  Skilled Nursing Facility  Not Applicable

## 2022-04-24 NOTE — Assessment & Plan Note (Signed)
CIWA 
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
## 2022-04-24 NOTE — Assessment & Plan Note (Signed)
--  found to be hypotensive with severe orthostasis today with PT.  Symptomatic. Plan: --NS 1L bolus f/b 100 ml/hr for 10 hours

## 2022-04-25 DIAGNOSIS — R040 Epistaxis: Secondary | ICD-10-CM | POA: Diagnosis present

## 2022-04-25 DIAGNOSIS — I951 Orthostatic hypotension: Secondary | ICD-10-CM | POA: Diagnosis present

## 2022-04-25 DIAGNOSIS — Z87891 Personal history of nicotine dependence: Secondary | ICD-10-CM | POA: Diagnosis not present

## 2022-04-25 DIAGNOSIS — Z87820 Personal history of traumatic brain injury: Secondary | ICD-10-CM | POA: Diagnosis not present

## 2022-04-25 DIAGNOSIS — R5381 Other malaise: Secondary | ICD-10-CM | POA: Diagnosis present

## 2022-04-25 DIAGNOSIS — Z888 Allergy status to other drugs, medicaments and biological substances status: Secondary | ICD-10-CM | POA: Diagnosis not present

## 2022-04-25 DIAGNOSIS — Z7901 Long term (current) use of anticoagulants: Secondary | ICD-10-CM | POA: Diagnosis not present

## 2022-04-25 DIAGNOSIS — N400 Enlarged prostate without lower urinary tract symptoms: Secondary | ICD-10-CM | POA: Diagnosis present

## 2022-04-25 DIAGNOSIS — I480 Paroxysmal atrial fibrillation: Secondary | ICD-10-CM | POA: Diagnosis present

## 2022-04-25 DIAGNOSIS — D6959 Other secondary thrombocytopenia: Secondary | ICD-10-CM | POA: Diagnosis present

## 2022-04-25 DIAGNOSIS — F10230 Alcohol dependence with withdrawal, uncomplicated: Secondary | ICD-10-CM | POA: Diagnosis present

## 2022-04-25 DIAGNOSIS — I10 Essential (primary) hypertension: Secondary | ICD-10-CM | POA: Diagnosis present

## 2022-04-25 DIAGNOSIS — Z7982 Long term (current) use of aspirin: Secondary | ICD-10-CM | POA: Diagnosis not present

## 2022-04-25 DIAGNOSIS — Z8249 Family history of ischemic heart disease and other diseases of the circulatory system: Secondary | ICD-10-CM | POA: Diagnosis not present

## 2022-04-25 DIAGNOSIS — F32A Depression, unspecified: Secondary | ICD-10-CM | POA: Diagnosis present

## 2022-04-25 DIAGNOSIS — Z79899 Other long term (current) drug therapy: Secondary | ICD-10-CM | POA: Diagnosis not present

## 2022-04-25 DIAGNOSIS — R531 Weakness: Secondary | ICD-10-CM | POA: Diagnosis present

## 2022-04-25 LAB — BASIC METABOLIC PANEL
Anion gap: 3 — ABNORMAL LOW (ref 5–15)
BUN: 13 mg/dL (ref 8–23)
CO2: 26 mmol/L (ref 22–32)
Calcium: 8.3 mg/dL — ABNORMAL LOW (ref 8.9–10.3)
Chloride: 106 mmol/L (ref 98–111)
Creatinine, Ser: 0.57 mg/dL — ABNORMAL LOW (ref 0.61–1.24)
GFR, Estimated: >60 mL/min (ref >60)
Glucose, Bld: 89 mg/dL (ref 70–99)
Potassium: 4 mmol/L (ref 3.5–5.1)
Sodium: 135 mmol/L (ref 135–145)

## 2022-04-25 LAB — CBC
HCT: 32.4 % — ABNORMAL LOW (ref 39.0–52.0)
Hemoglobin: 10.9 g/dL — ABNORMAL LOW (ref 13.0–17.0)
MCH: 31.8 pg (ref 26.0–34.0)
MCHC: 33.6 g/dL (ref 30.0–36.0)
MCV: 94.5 fL (ref 80.0–100.0)
Platelets: 65 10*3/uL — ABNORMAL LOW (ref 150–400)
RBC: 3.43 MIL/uL — ABNORMAL LOW (ref 4.22–5.81)
RDW: 11.9 % (ref 11.5–15.5)
WBC: 3.2 10*3/uL — ABNORMAL LOW (ref 4.0–10.5)
nRBC: 0 % (ref 0.0–0.2)

## 2022-04-25 LAB — MAGNESIUM: Magnesium: 2 mg/dL (ref 1.7–2.4)

## 2022-04-25 MED ORDER — MIDODRINE HCL 5 MG PO TABS
5.0000 mg | ORAL_TABLET | Freq: Three times a day (TID) | ORAL | Status: DC
  Administered 2022-04-25 – 2022-04-26 (×3): 5 mg via ORAL
  Filled 2022-04-25 (×3): qty 1

## 2022-04-25 MED ORDER — PROCHLORPERAZINE EDISYLATE 10 MG/2ML IJ SOLN
INTRAMUSCULAR | Status: AC
  Filled 2022-04-25: qty 2

## 2022-04-25 MED ORDER — PROCHLORPERAZINE EDISYLATE 10 MG/2ML IJ SOLN
10.0000 mg | Freq: Once | INTRAMUSCULAR | Status: AC
  Administered 2022-04-25: 10 mg via INTRAVENOUS

## 2022-04-25 MED ORDER — METOPROLOL TARTRATE 25 MG PO TABS: 25.0000 mg | ORAL_TABLET | Freq: Two times a day (BID) | ORAL | Status: DC

## 2022-04-26 DIAGNOSIS — R531 Weakness: Secondary | ICD-10-CM | POA: Diagnosis not present

## 2022-04-26 LAB — BASIC METABOLIC PANEL
Anion gap: 5 (ref 5–15)
BUN: 11 mg/dL (ref 8–23)
CO2: 28 mmol/L (ref 22–32)
Calcium: 9.1 mg/dL (ref 8.9–10.3)
Chloride: 102 mmol/L (ref 98–111)
Creatinine, Ser: 0.6 mg/dL — ABNORMAL LOW (ref 0.61–1.24)
GFR, Estimated: >60 mL/min (ref >60)
Glucose, Bld: 97 mg/dL (ref 70–99)
Potassium: 3.8 mmol/L (ref 3.5–5.1)
Sodium: 135 mmol/L (ref 135–145)

## 2022-04-26 LAB — CBC
HCT: 35.8 % — ABNORMAL LOW (ref 39.0–52.0)
Hemoglobin: 11.6 g/dL — ABNORMAL LOW (ref 13.0–17.0)
MCH: 31 pg (ref 26.0–34.0)
MCHC: 32.4 g/dL (ref 30.0–36.0)
MCV: 95.7 fL (ref 80.0–100.0)
Platelets: 99 10*3/uL — ABNORMAL LOW (ref 150–400)
RBC: 3.74 MIL/uL — ABNORMAL LOW (ref 4.22–5.81)
RDW: 11.9 % (ref 11.5–15.5)
WBC: 3.3 10*3/uL — ABNORMAL LOW (ref 4.0–10.5)
nRBC: 0 % (ref 0.0–0.2)

## 2022-04-26 LAB — MAGNESIUM: Magnesium: 2 mg/dL (ref 1.7–2.4)

## 2022-04-26 MED ORDER — ACETAMINOPHEN 500 MG PO TABS: 1000.0000 mg | ORAL_TABLET | Freq: Three times a day (TID) | ORAL | 0 refills | Status: DC | PRN

## 2022-04-26 MED ORDER — TIZANIDINE HCL 4 MG PO TABS: 8.0000 mg | ORAL_TABLET | Freq: Three times a day (TID) | ORAL | 0 refills | Status: DC | PRN

## 2022-04-26 MED ORDER — POLYETHYLENE GLYCOL 3350 17 GM/SCOOP PO POWD: 17.0000 g | Freq: Every day | ORAL | Status: DC | PRN

## 2022-04-26 MED ORDER — SENNOSIDES-DOCUSATE SODIUM 8.6-50 MG PO TABS: 2.0000 | ORAL_TABLET | Freq: Every evening | ORAL | Status: DC | PRN

## 2022-04-26 MED ORDER — ADULT MULTIVITAMIN W/MINERALS CH: 1.0000 | ORAL_TABLET | Freq: Every day | ORAL | 0 refills | Status: DC

## 2022-04-26 MED ORDER — MELATONIN 5 MG PO TABS
5.0000 mg | ORAL_TABLET | Freq: Once | ORAL | Status: AC
Start: 2022-04-26 — End: 2022-04-26
  Administered 2022-04-26: 5 mg via ORAL
  Filled 2022-04-26: qty 1

## 2022-04-27 LAB — CBC
HCT: 35.2 % — ABNORMAL LOW (ref 39.0–52.0)
Hemoglobin: 11.7 g/dL — ABNORMAL LOW (ref 13.0–17.0)
MCH: 31.2 pg (ref 26.0–34.0)
MCHC: 33.2 g/dL (ref 30.0–36.0)
MCV: 93.9 fL (ref 80.0–100.0)
Platelets: 120 10*3/uL — ABNORMAL LOW (ref 150–400)
RBC: 3.75 MIL/uL — ABNORMAL LOW (ref 4.22–5.81)
RDW: 11.9 % (ref 11.5–15.5)
WBC: 5.1 10*3/uL (ref 4.0–10.5)
nRBC: 0 % (ref 0.0–0.2)

## 2022-04-27 LAB — BASIC METABOLIC PANEL
Anion gap: 5 (ref 5–15)
BUN: 12 mg/dL (ref 8–23)
CO2: 28 mmol/L (ref 22–32)
Calcium: 9.2 mg/dL (ref 8.9–10.3)
Chloride: 101 mmol/L (ref 98–111)
Creatinine, Ser: 0.58 mg/dL — ABNORMAL LOW (ref 0.61–1.24)
GFR, Estimated: >60 mL/min (ref >60)
Glucose, Bld: 90 mg/dL (ref 70–99)
Potassium: 3.8 mmol/L (ref 3.5–5.1)
Sodium: 134 mmol/L — ABNORMAL LOW (ref 135–145)

## 2022-04-27 LAB — MAGNESIUM: Magnesium: 2 mg/dL (ref 1.7–2.4)

## 2022-04-27 MED ORDER — POLYETHYLENE GLYCOL 3350 17 G PO PACK
17.0000 g | PACK | Freq: Every day | ORAL | Status: DC | PRN
  Administered 2022-04-27 – 2022-04-28 (×2): 17 g via ORAL
  Filled 2022-04-27 (×2): qty 1

## 2022-04-27 NOTE — Progress Notes (Signed)
Mobility Specialist - Progress Note   04/27/22 1400  Mobility  Activity Ambulated with assistance to bathroom;Stood at bedside;Dangled on edge of bed  Level of Assistance Minimal assist, patient does 75% or more  Assistive Device None  Distance Ambulated (ft) 20 ft  Activity Response Tolerated well  $Mobility charge 1 Mobility     Pt semi supine upon arrival using RA. Completes bed mobility ModI and STS MinA. Ambulates to and from bathroom MinA voicing no complaints and returns to bed with bed alarm set and needs in reach.  Clarisa Schools Mobility Specialist 04/27/22, 2:17 PM

## 2022-04-28 NOTE — Plan of Care (Signed)
Pt AAOx4, no pain. VS are stable. Pending discharge, placement pending. Bed is in lowest position, call light within reach. Will continue to monitor.

## 2022-04-29 LAB — CULTURE, BLOOD (ROUTINE X 2)
Culture: NO GROWTH
Culture: NO GROWTH

## 2022-04-29 NOTE — TOC Progression Note (Signed)
Transition of Care (TOC) - Progression Note    Patient Details  Name: Douglas Peterson. MRN: [id number] Date of Birth: [birth year]  Transition of [facility]) CM/SW Contact  [name], RN Phone Number: 04/29/2022, 10:16 AM  Clinical Narrative:      Per Eunice Blase at [facility]. Insurance has denied SNF and offered Peer to peer  MD notified.  MD will not be completing peer to peer  MD, physician advisor Dr Butler Denmark, and [facility] supervisor in agreement that if patient loses appeal he will discharge home with home health services, and if he chooses to appeal insurances decision to deny SNF he can do that from home      Expected Discharge Plan and Services           Expected Discharge Date: 04/26/22                                     Social Determinants of Health (SDOH) Interventions    Readmission Risk Interventions    03/14/2022   12:48 PM 08/23/2020   11:52 AM 08/19/2020    1:32 PM  Readmission Risk Prevention Plan  Transportation Screening Complete Complete Complete  PCP or Specialist Appt within 3-5 Days Complete    HRI or Home Care Consult Complete    Social Work Consult for Recovery Care Planning/Counseling Complete    Palliative Care Screening Not Applicable    Medication Review Oceanographer) Complete Complete Complete  PCP or Specialist appointment within 3-5 days of discharge   Complete  HRI or Home Care Consult  Complete   SW Recovery Care/Counseling Consult   Complete  Palliative Care Screening   Not Applicable  Skilled Nursing Facility  Not Applicable

## 2022-04-29 NOTE — Plan of Care (Signed)
AVS and education provided. IV removed with no complications. VS are stable. Pt transported home by EMS.

## 2022-04-30 IMAGING — CT CT ABD-PELV W/ CM
2 of 8 series · 14 of 46 positions shown, 16 images · IV contrast (APPLIED)
Comparison: 01/17/2020

CLINICAL DATA: Patient originally was having abdominal pain, now
complaining of back pain.

EXAM:
CT ABDOMEN AND PELVIS WITH CONTRAST
TECHNIQUE: Multidetector CT imaging of the abdomen and pelvis was performed
using the standard protocol following bolus administration of
intravenous contrast.
CONTRAST:  75mL OMNIPAQUE IOHEXOL 300 MG/ML  SOLN

[Series 3: routine abd/pel with · axial · 0.74mm/px · z∈[-827,-407]mm · 11 of 97 slices shown, 13 images]
[im 7/97  soft-tissue]
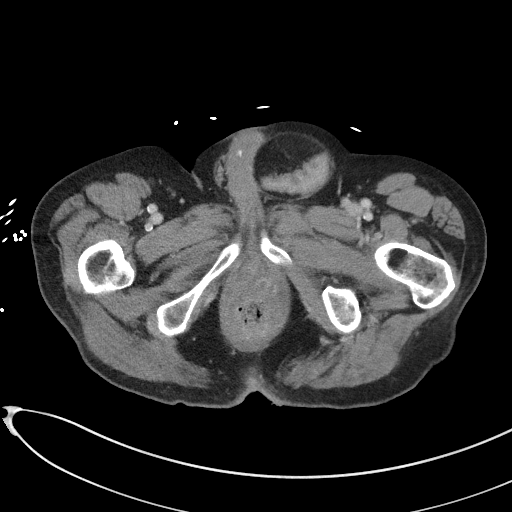
[im 7/97  bone]
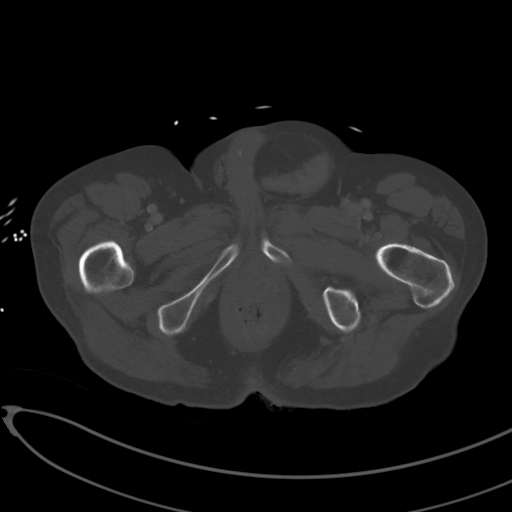
[im 19/97  soft-tissue]
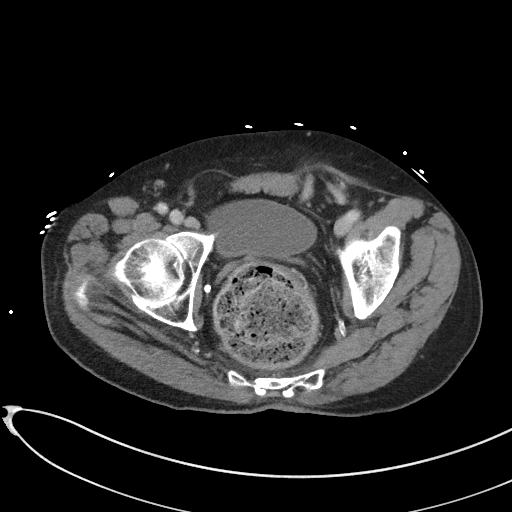
[im 25/97  soft-tissue]
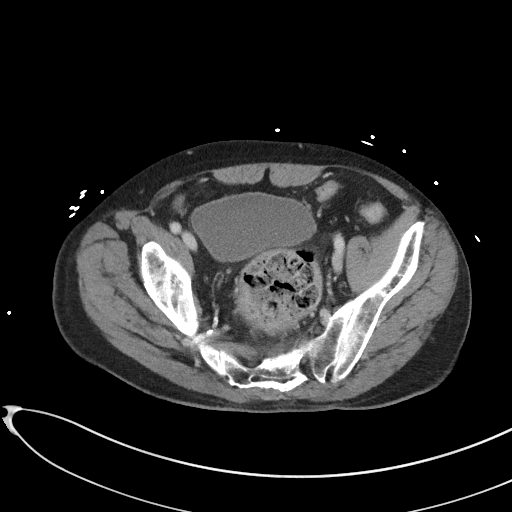
[im 31/97  soft-tissue]
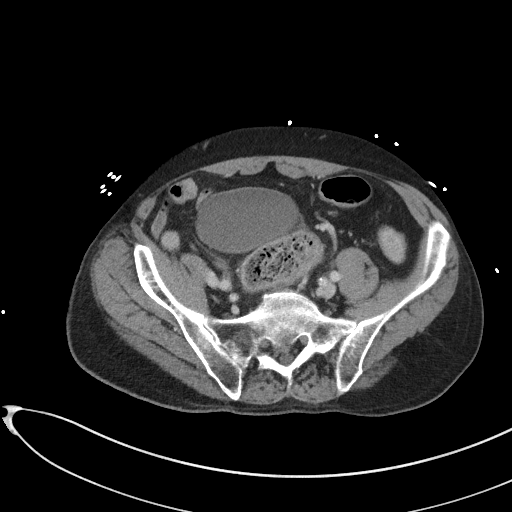
[im 43/97  soft-tissue]
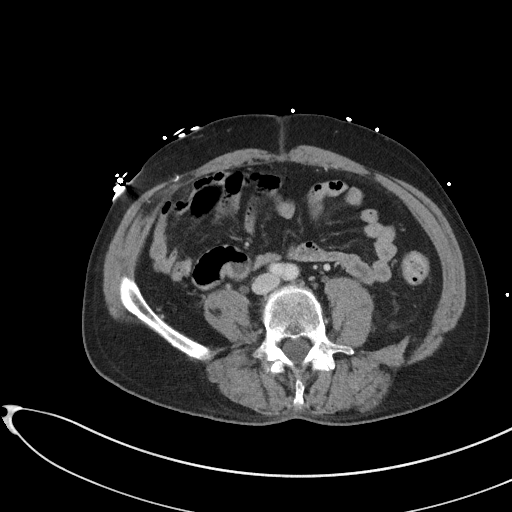
[im 49/97  soft-tissue]
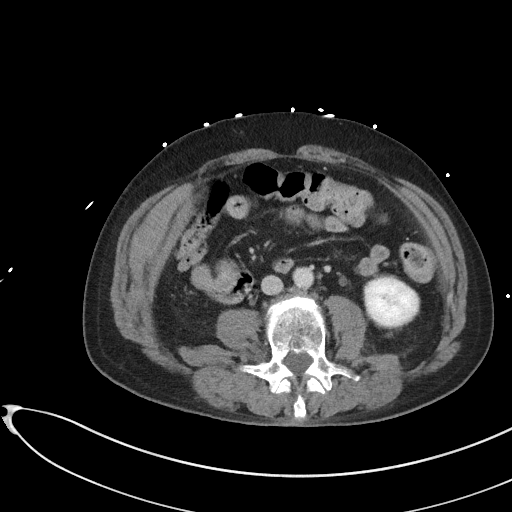
[im 55/97  soft-tissue]
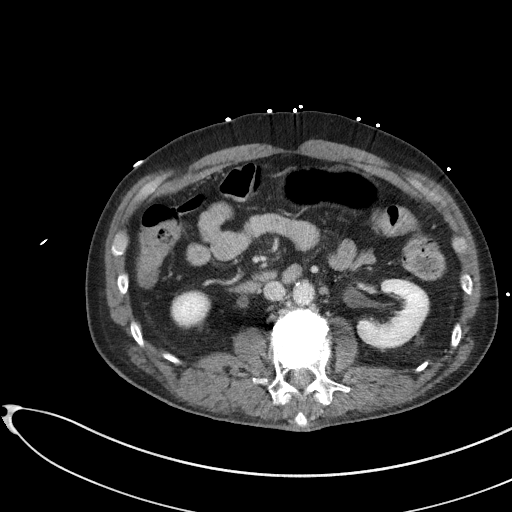
[im 67/97  soft-tissue]
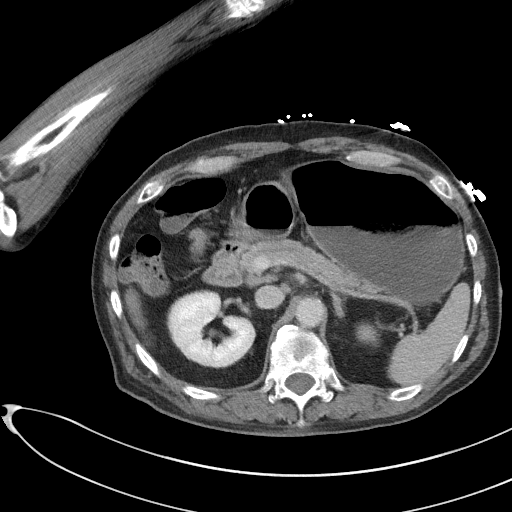
[im 73/97  soft-tissue]
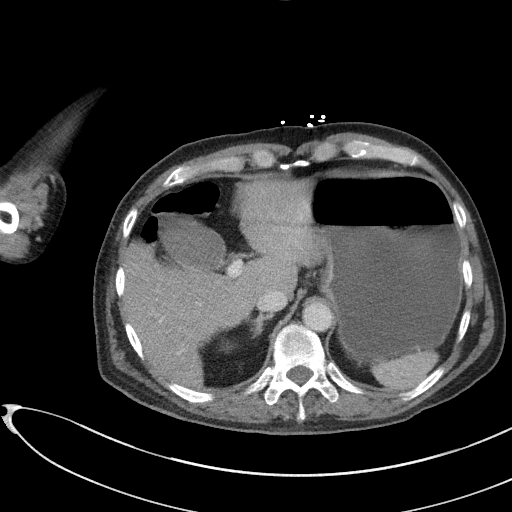
[im 73/97  bone]
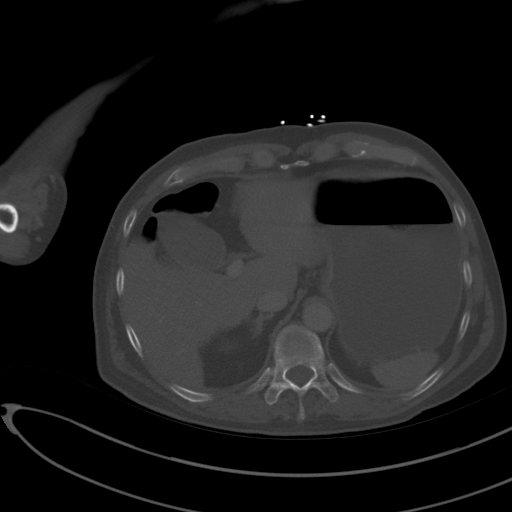
[im 79/97  soft-tissue]
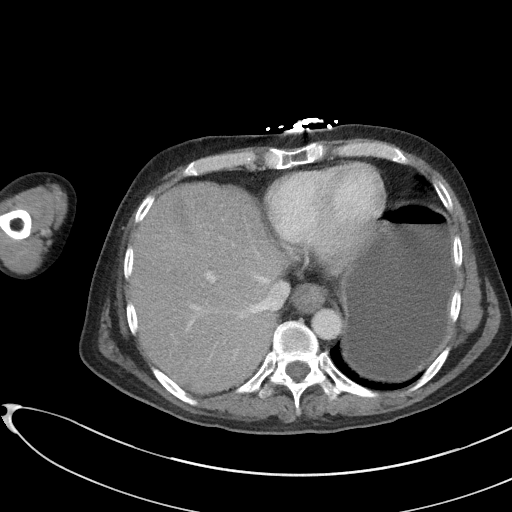
[im 91/97  soft-tissue]
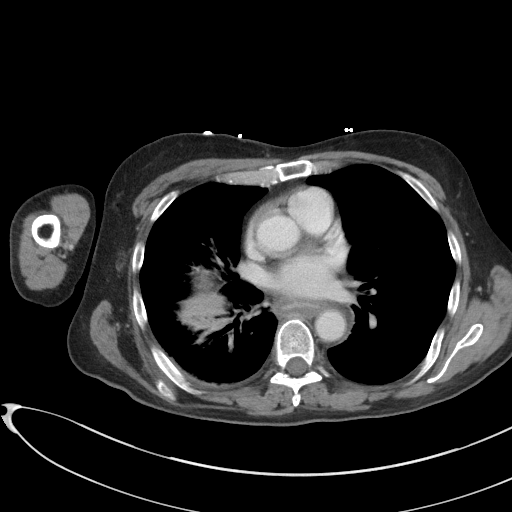

[Series 8: coronal st · coronal · 0.60mm/px · 3 of 82 slices shown]
[im 21/82  soft-tissue]
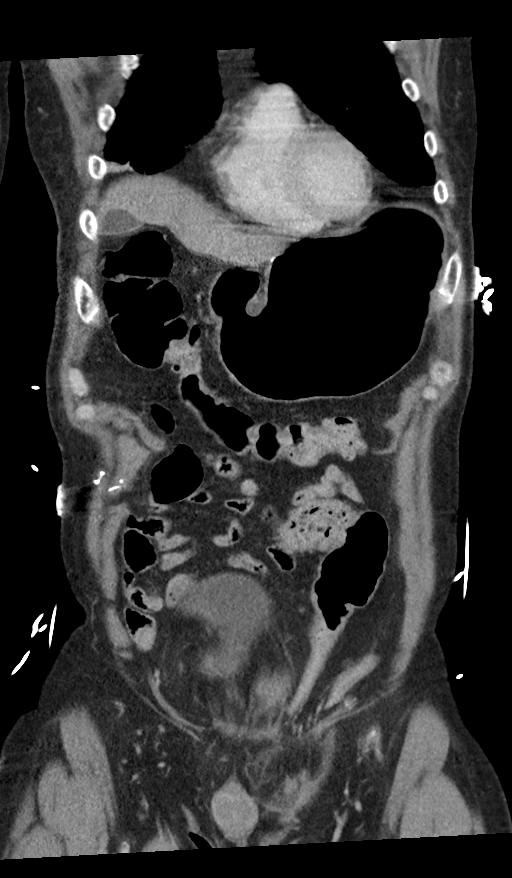
[im 41/82  soft-tissue]
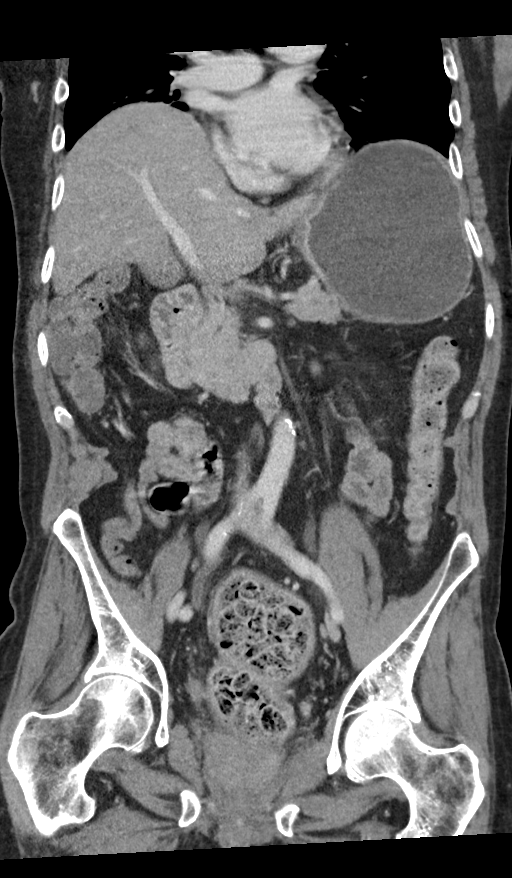
[im 61/82  soft-tissue]
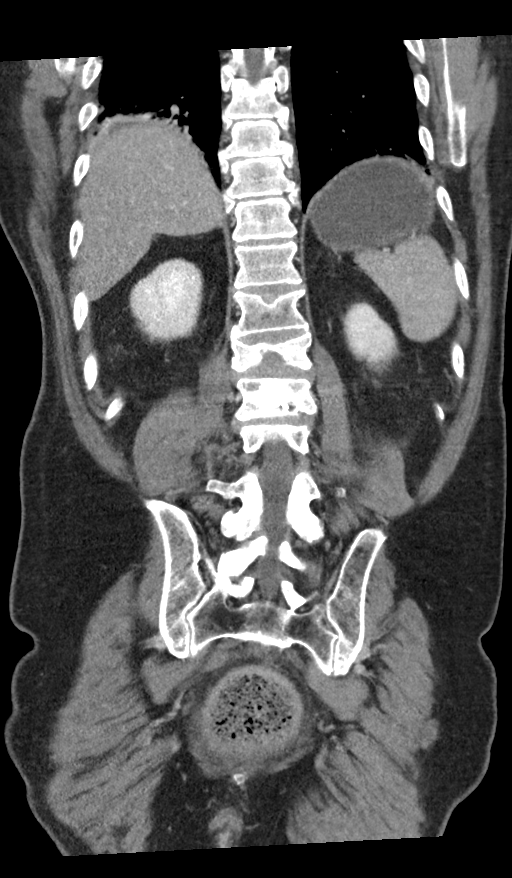

[14 of 46 positions shown; findings below may reference images not displayed]

FINDINGS: Lower chest: There is opacity at the bases of the right lower lobe
and right middle lobe and minimally of the left lower lobe,
consistent with atelectasis/scarring, similar to the prior CT. Heart
normal in size. The distal esophagus is distended with evidence of
wall thickening.

Hepatobiliary: Liver normal in size. Decreased attenuation of the
liver consistent with fatty infiltration. No mass or focal lesion.
Normal gallbladder. No bile duct dilation.

Pancreas: Unremarkable. No pancreatic ductal dilatation or
surrounding inflammatory changes.

Spleen: Normal in size without focal abnormality.

Adrenals/Urinary Tract: No adrenal masses. Kidneys are normal in
size, orientation and position with symmetric enhancement and
excretion. No renal masses, stones or hydronephrosis. There is mild
dilation of the ureters. No ureteral stones. Bladder is
unremarkable.

Stomach/Bowel: Rectum is significantly distended with stool,
measuring 8.2 cm transversely. Rectal wall appears mildly thickened.
Subtle adjacent fat inflammation.

Distal sigmoid colon is distended with stool. Proximal sigmoid colon
enters a left inguinal hernia without evidence of obstruction,
incarceration or strangulation. Remainder of the colon is
unremarkable. Stomach is moderately distended with fluid and
nondependent air. No stomach wall thickening or inflammation. Normal
small bowel. No evidence of appendicitis.

Vascular/Lymphatic: Mild aortic atherosclerotic calcifications. No
aneurysm. Portal vein widely patent.

Reproductive: Unremarkable.

Other: No ascites.

Musculoskeletal: Old fractures of L1 and L2, L2 previously treated
with vertebroplasty. Old mild depression of the upper endplate T12.
Mild wedge-shaped deformity of T8. These findings are all stable
from the prior CT. No acute fracture. No osteoblastic or osteolytic
lesions.
IMPRESSION: 1. Rectum and lower sigmoid colon are impacted with stool, rectum
moderately distended. There is also mild wall thickening of the
rectum with subtle adjacent inflammation consistent with proctitis.
2. Left inguinal hernia containing the proximal sigmoid colon,
without evidence of obstruction, incarceration or strangulation.
3. Dilated distal esophagus with evidence wall thickening. Consider
esophagitis in the proper clinical setting.
4. Hepatic steatosis.
5. Chronic vertebral compression fractures. Right greater than left
lung base opacities consistent with atelectasis stable from the
prior CT.

## 2022-05-01 IMAGING — DX DG ABDOMEN 1V
1 series · 1 of 1 positions shown · non-contrast
Comparison: 03/17/2020

CLINICAL DATA: Constipation. Patient reports too painful bowel
movements today.

EXAM:
ABDOMEN - 1 VIEW

[abdomen supine]
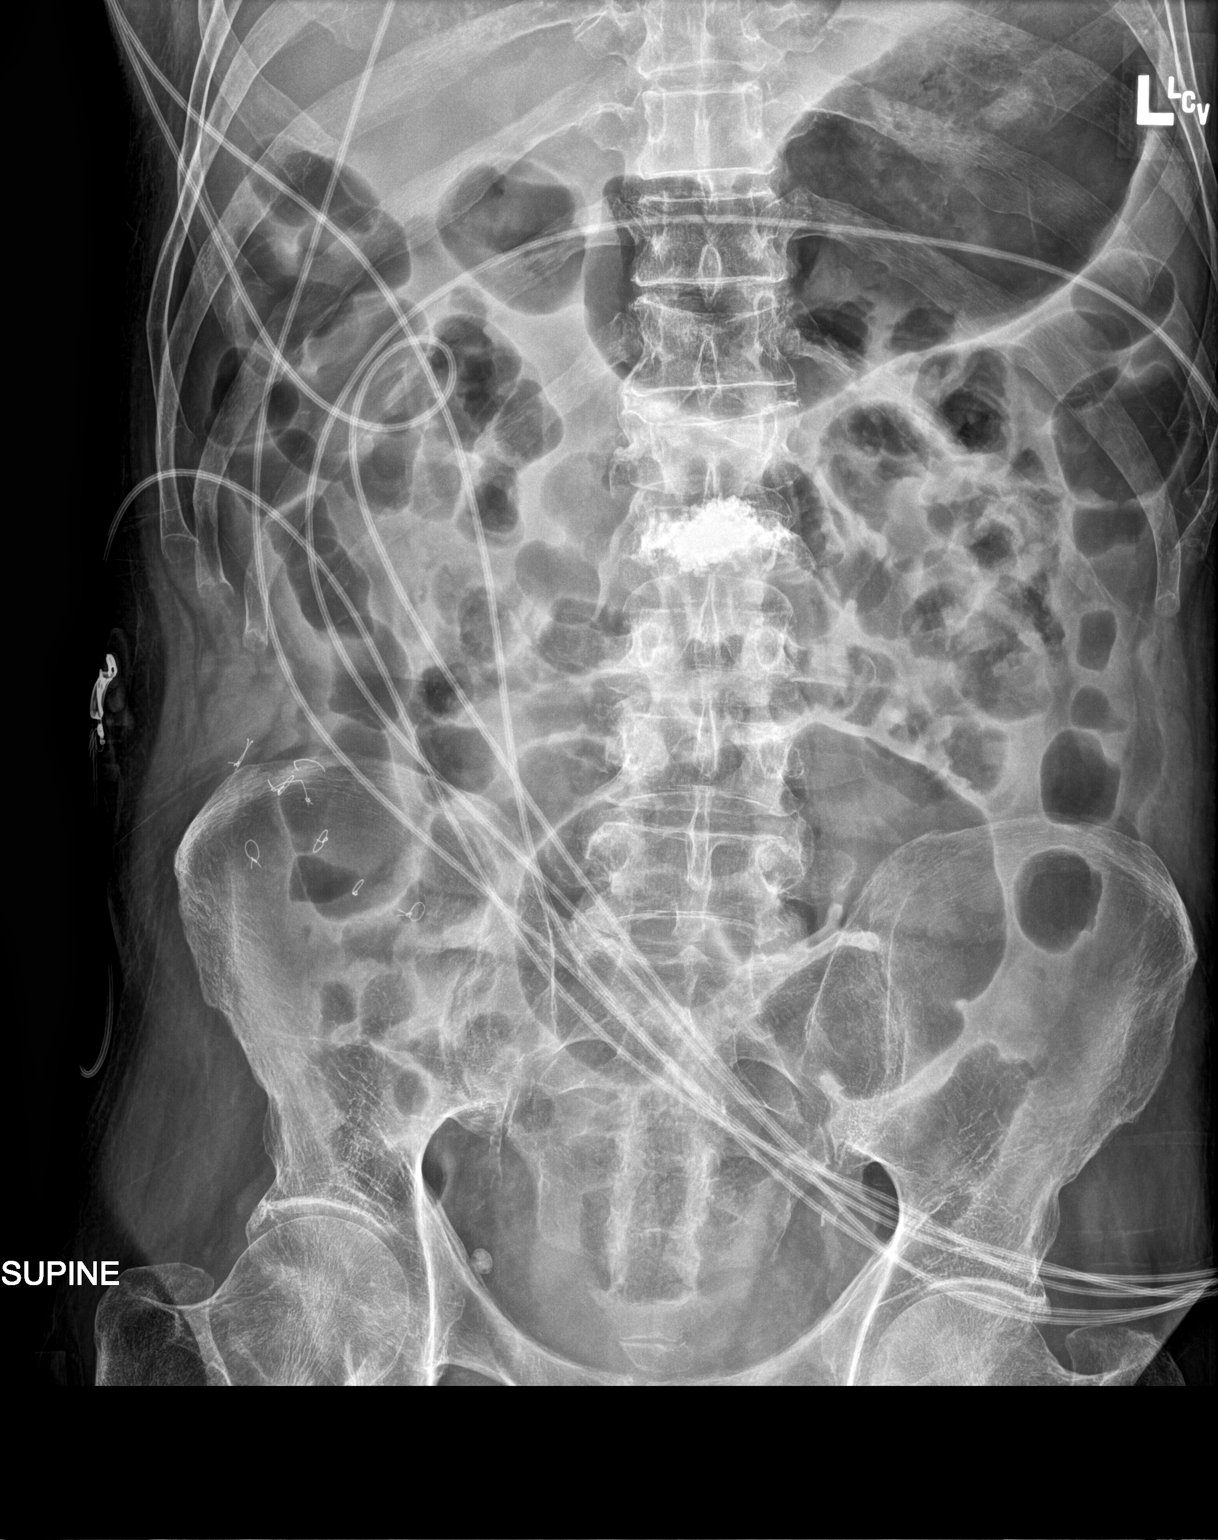

[1 of 1 positions shown; findings below may reference images not displayed]

FINDINGS: Gaseous distension of the small and large bowel loops again noted.
When compared with the previous exam there is been decrease caliber
of the descending colon. Persistent but improved stool burden noted
within the rectum. No free intraperitoneal air.
IMPRESSION: 1. Persistent but improved stool burden within the rectum.
2. Improvement in colonic distention.

## 2022-05-05 IMAGING — DX DG ABD PORTABLE 1V
1 series · 1 of 1 positions shown · non-contrast
Comparison: 03/18/2020

CLINICAL DATA: Severe nausea and episode of vomiting.

EXAM:
PORTABLE ABDOMEN - 1 VIEW

[abdomen supine]
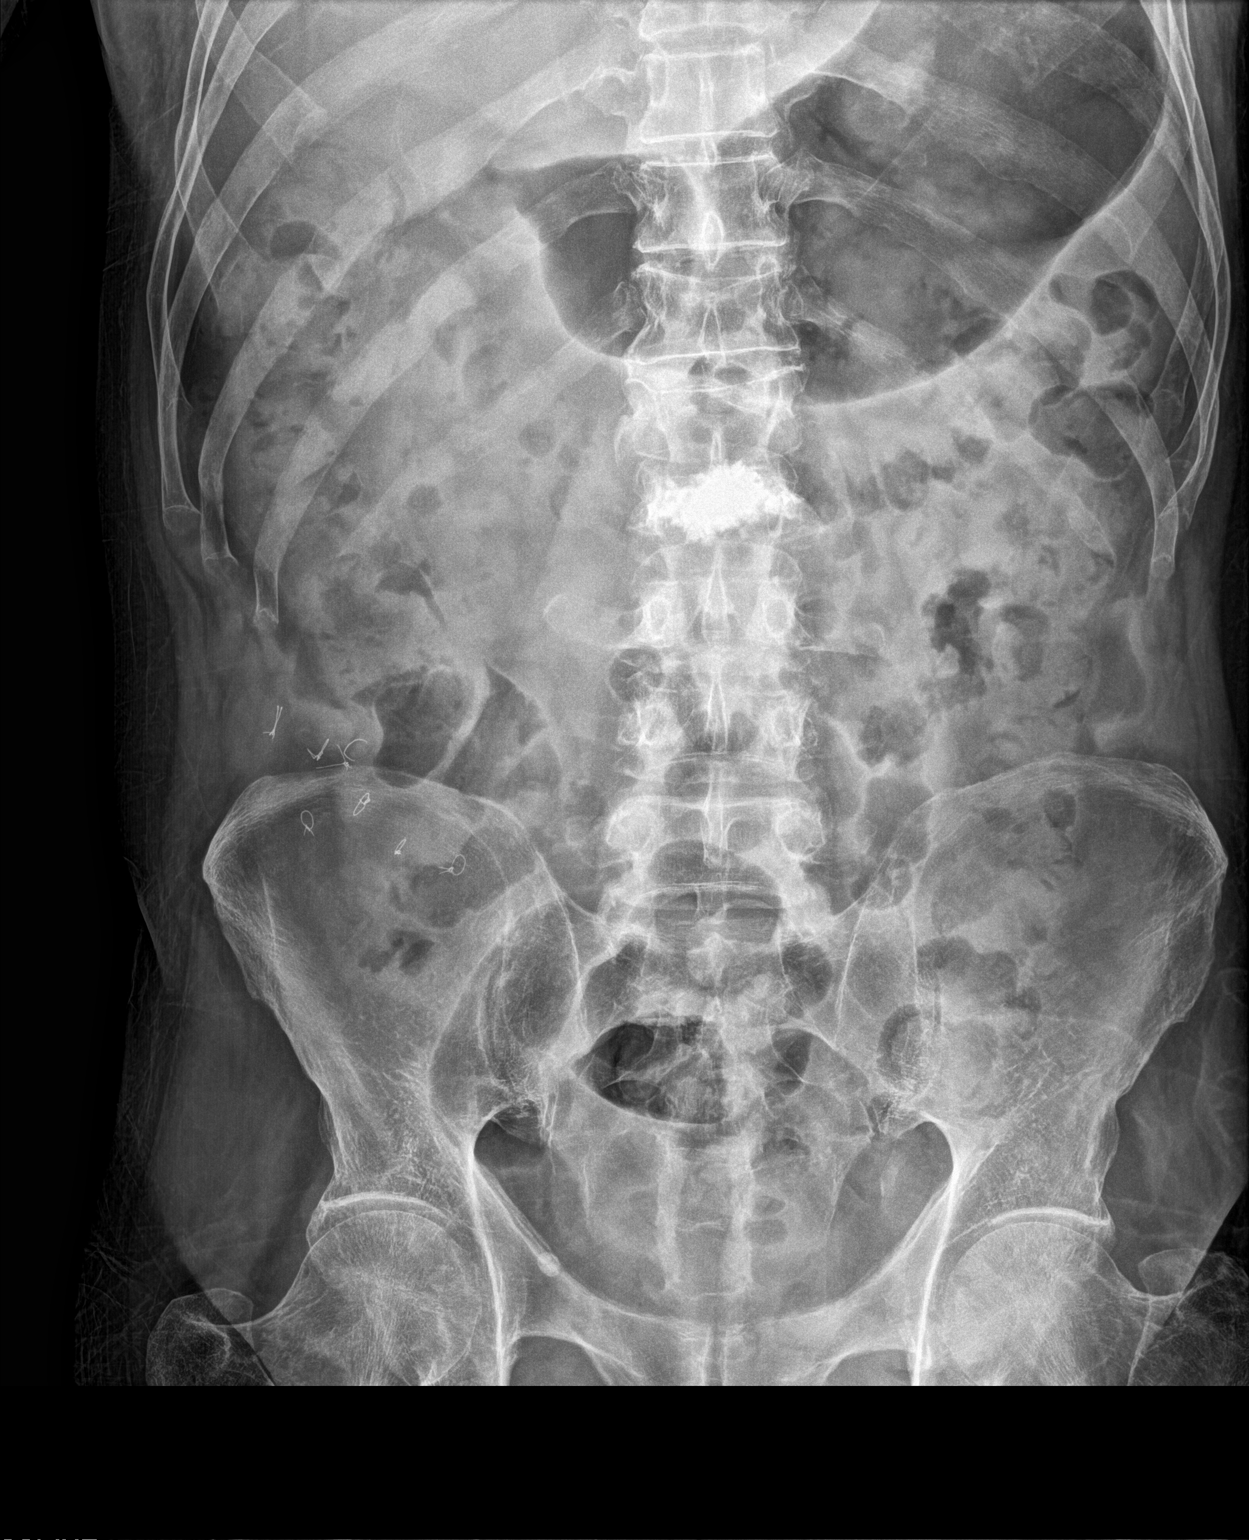

[1 of 1 positions shown; findings below may reference images not displayed]

FINDINGS: Bowel gas pattern is nonobstructive. Several air-filled nondilated
loops of large and small bowel. Stable gaseous distention of the
stomach. No free peritoneal air. Surgical clips over the right lower
abdomen. Phlebolith over the right pelvis. Remainder of the exam is
unchanged.
IMPRESSION: Nonobstructive bowel gas pattern

## 2022-05-06 IMAGING — DX DG ABD PORTABLE 1V
1 series · 1 of 1 positions shown · non-contrast
Comparison: March 22, 2020

CLINICAL DATA: Abdominal pain with vomiting

EXAM:
PORTABLE ABDOMEN - 1 VIEW

[abdomen supine]
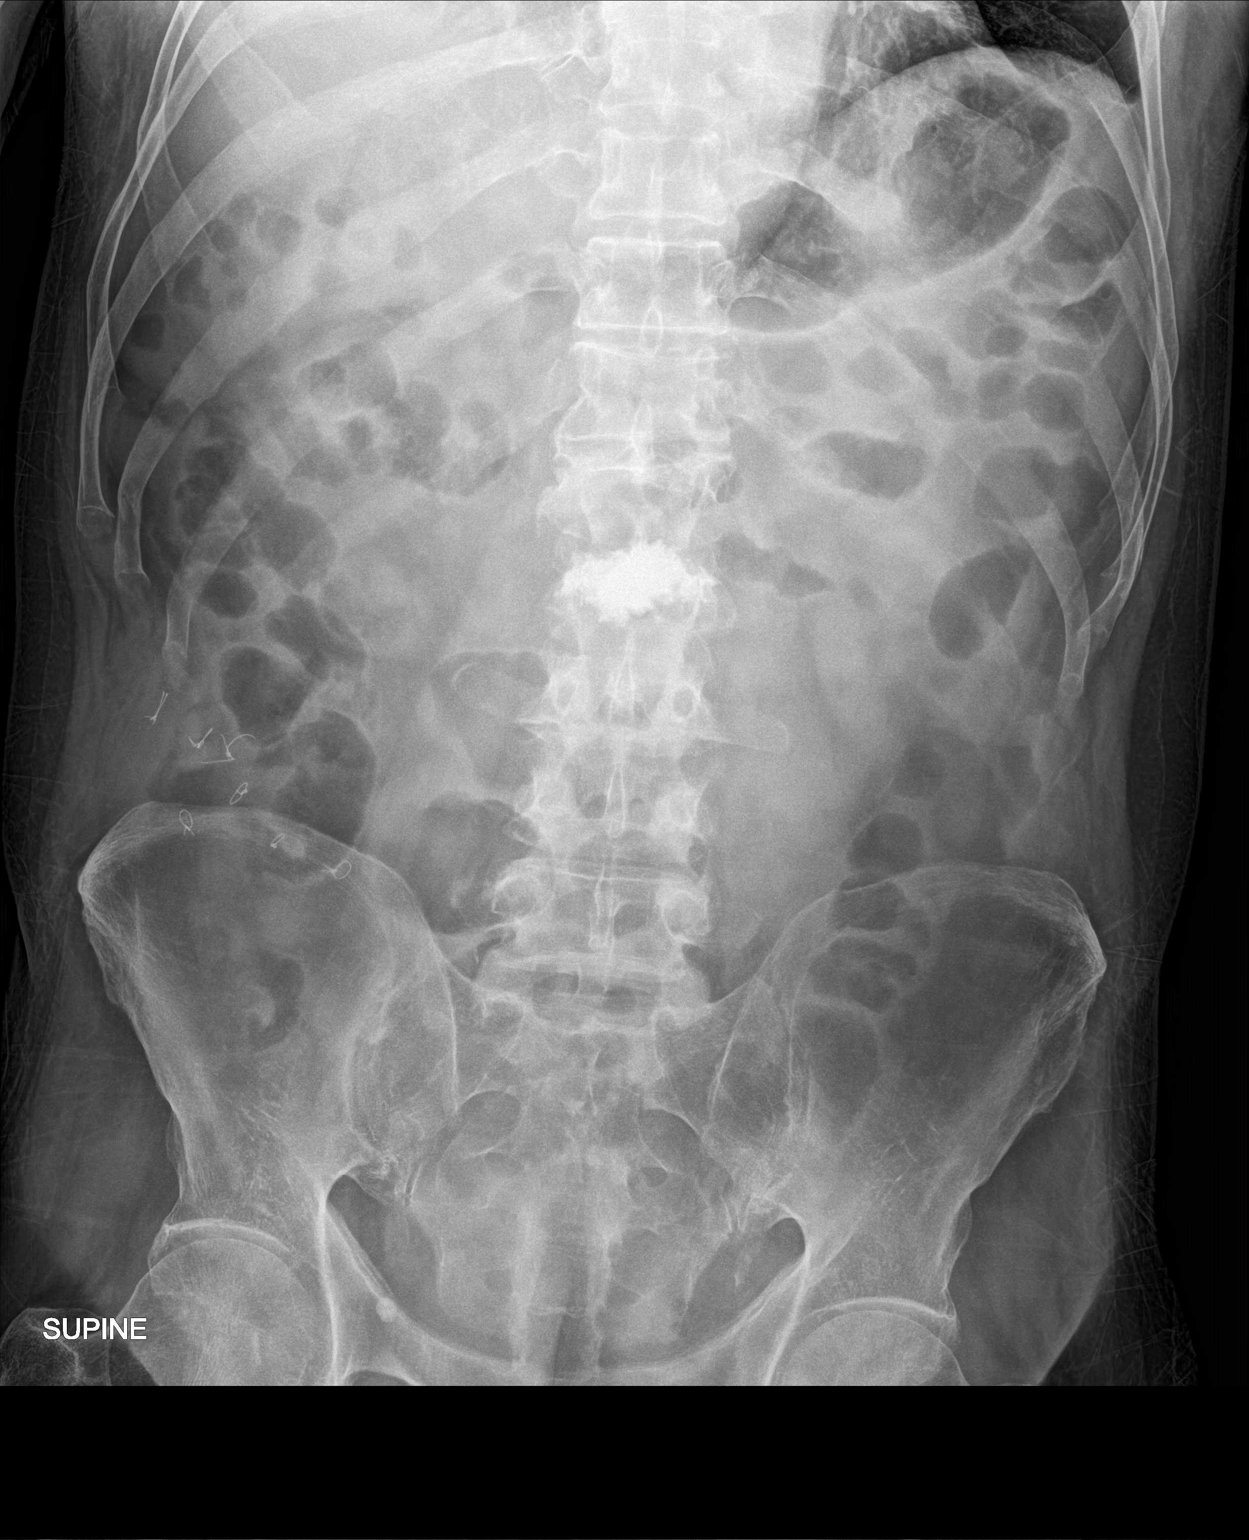

[1 of 1 positions shown; findings below may reference images not displayed]

FINDINGS: There is no bowel dilatation or air-fluid level to suggest bowel
obstruction. No free air evident on this supine examination. There
is postoperative change in the right abdomen. [name] status post
kyphoplasty at L2.
IMPRESSION: No bowel obstruction or free air evident.

## 2022-05-10 ENCOUNTER — Other Ambulatory Visit: Payer: Self-pay

## 2022-05-10 ENCOUNTER — Inpatient Hospital Stay
Admission: EM | Admit: 2022-05-10 | Discharge: 2022-05-19 | DRG: 641 | Disposition: A | Discharge status: Home / Self Care | Payer: Medicare HMO | Attending: Internal Medicine | Admitting: Internal Medicine

## 2022-05-10 DIAGNOSIS — E876 Hypokalemia: Secondary | ICD-10-CM | POA: Diagnosis present

## 2022-05-10 DIAGNOSIS — M62838 Other muscle spasm: Secondary | ICD-10-CM | POA: Diagnosis present

## 2022-05-10 DIAGNOSIS — R748 Abnormal levels of other serum enzymes: Secondary | ICD-10-CM | POA: Diagnosis present

## 2022-05-10 DIAGNOSIS — F1020 Alcohol dependence, uncomplicated: Secondary | ICD-10-CM | POA: Diagnosis present

## 2022-05-10 DIAGNOSIS — Z789 Other specified health status: Secondary | ICD-10-CM

## 2022-05-10 DIAGNOSIS — I480 Paroxysmal atrial fibrillation: Secondary | ICD-10-CM | POA: Diagnosis present

## 2022-05-10 DIAGNOSIS — Z79899 Other long term (current) drug therapy: Secondary | ICD-10-CM

## 2022-05-10 DIAGNOSIS — B879 Myiasis, unspecified: Secondary | ICD-10-CM

## 2022-05-10 DIAGNOSIS — F10929 Alcohol use, unspecified with intoxication, unspecified: Secondary | ICD-10-CM

## 2022-05-10 DIAGNOSIS — I10 Essential (primary) hypertension: Secondary | ICD-10-CM | POA: Diagnosis present

## 2022-05-10 DIAGNOSIS — F1010 Alcohol abuse, uncomplicated: Secondary | ICD-10-CM

## 2022-05-10 DIAGNOSIS — Z7982 Long term (current) use of aspirin: Secondary | ICD-10-CM

## 2022-05-10 DIAGNOSIS — G8191 Hemiplegia, unspecified affecting right dominant side: Secondary | ICD-10-CM | POA: Diagnosis present

## 2022-05-10 DIAGNOSIS — F10229 Alcohol dependence with intoxication, unspecified: Secondary | ICD-10-CM | POA: Diagnosis present

## 2022-05-10 DIAGNOSIS — Z888 Allergy status to other drugs, medicaments and biological substances status: Secondary | ICD-10-CM

## 2022-05-10 DIAGNOSIS — E871 Hypo-osmolality and hyponatremia: Secondary | ICD-10-CM | POA: Diagnosis not present

## 2022-05-10 DIAGNOSIS — R627 Adult failure to thrive: Secondary | ICD-10-CM | POA: Diagnosis present

## 2022-05-10 DIAGNOSIS — Z87820 Personal history of traumatic brain injury: Secondary | ICD-10-CM

## 2022-05-10 DIAGNOSIS — B351 Tinea unguium: Secondary | ICD-10-CM | POA: Diagnosis present

## 2022-05-10 DIAGNOSIS — R531 Weakness: Secondary | ICD-10-CM

## 2022-05-10 DIAGNOSIS — Y908 Blood alcohol level of 240 mg/100 ml or more: Secondary | ICD-10-CM | POA: Diagnosis present

## 2022-05-10 DIAGNOSIS — L601 Onycholysis: Secondary | ICD-10-CM | POA: Diagnosis present

## 2022-05-10 DIAGNOSIS — Z87891 Personal history of nicotine dependence: Secondary | ICD-10-CM

## 2022-05-10 MED ORDER — LACTATED RINGERS IV BOLUS
1000.0000 mL | Freq: Once | INTRAVENOUS | Status: AC
  Administered 2022-05-11: 1000 mL via INTRAVENOUS

## 2022-05-10 NOTE — ED Triage Notes (Signed)
Pt states has been drinking etoh all day and does not feel well. Pt states he can no longer take care of himself at home. Pt has maggots under toenails and between toes. Pt covered in dried urine, dried and wet feces.
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
## 2022-05-10 NOTE — ED Notes (Signed)
Pt cleansed of stool, urine most of maggots, some maggots still coming out from under toenails. Md aware of maggots.

## 2022-05-11 ENCOUNTER — Encounter: Payer: Self-pay | Admitting: Internal Medicine

## 2022-05-11 ENCOUNTER — Emergency Department: Payer: Medicare HMO

## 2022-05-11 DIAGNOSIS — L601 Onycholysis: Secondary | ICD-10-CM | POA: Diagnosis present

## 2022-05-11 DIAGNOSIS — M62838 Other muscle spasm: Secondary | ICD-10-CM | POA: Diagnosis present

## 2022-05-11 DIAGNOSIS — B351 Tinea unguium: Secondary | ICD-10-CM | POA: Diagnosis present

## 2022-05-11 DIAGNOSIS — R748 Abnormal levels of other serum enzymes: Secondary | ICD-10-CM | POA: Diagnosis not present

## 2022-05-11 DIAGNOSIS — Z87820 Personal history of traumatic brain injury: Secondary | ICD-10-CM | POA: Diagnosis not present

## 2022-05-11 DIAGNOSIS — E876 Hypokalemia: Secondary | ICD-10-CM | POA: Diagnosis present

## 2022-05-11 DIAGNOSIS — F1020 Alcohol dependence, uncomplicated: Secondary | ICD-10-CM

## 2022-05-11 DIAGNOSIS — I480 Paroxysmal atrial fibrillation: Secondary | ICD-10-CM | POA: Diagnosis present

## 2022-05-11 DIAGNOSIS — Z888 Allergy status to other drugs, medicaments and biological substances status: Secondary | ICD-10-CM | POA: Diagnosis not present

## 2022-05-11 DIAGNOSIS — R531 Weakness: Secondary | ICD-10-CM

## 2022-05-11 DIAGNOSIS — I10 Essential (primary) hypertension: Secondary | ICD-10-CM | POA: Diagnosis present

## 2022-05-11 DIAGNOSIS — F10229 Alcohol dependence with intoxication, unspecified: Secondary | ICD-10-CM | POA: Diagnosis present

## 2022-05-11 DIAGNOSIS — B879 Myiasis, unspecified: Secondary | ICD-10-CM

## 2022-05-11 DIAGNOSIS — E871 Hypo-osmolality and hyponatremia: Secondary | ICD-10-CM | POA: Diagnosis present

## 2022-05-11 DIAGNOSIS — Z7982 Long term (current) use of aspirin: Secondary | ICD-10-CM | POA: Diagnosis not present

## 2022-05-11 DIAGNOSIS — F1010 Alcohol abuse, uncomplicated: Secondary | ICD-10-CM | POA: Diagnosis present

## 2022-05-11 DIAGNOSIS — G8191 Hemiplegia, unspecified affecting right dominant side: Secondary | ICD-10-CM | POA: Diagnosis present

## 2022-05-11 DIAGNOSIS — Z87891 Personal history of nicotine dependence: Secondary | ICD-10-CM | POA: Diagnosis not present

## 2022-05-11 DIAGNOSIS — R627 Adult failure to thrive: Secondary | ICD-10-CM | POA: Diagnosis present

## 2022-05-11 DIAGNOSIS — Z79899 Other long term (current) drug therapy: Secondary | ICD-10-CM | POA: Diagnosis not present

## 2022-05-11 DIAGNOSIS — Z789 Other specified health status: Secondary | ICD-10-CM

## 2022-05-11 DIAGNOSIS — Y908 Blood alcohol level of 240 mg/100 ml or more: Secondary | ICD-10-CM | POA: Diagnosis present

## 2022-05-11 LAB — PROTIME-INR
INR: 1 (ref 0.8–1.2)
Prothrombin Time: 13.1 seconds (ref 11.4–15.2)

## 2022-05-11 LAB — URINALYSIS, ROUTINE W REFLEX MICROSCOPIC
Bilirubin Urine: NEGATIVE
Glucose, UA: NEGATIVE mg/dL
Hgb urine dipstick: NEGATIVE
Ketones, ur: 5 mg/dL — AB
Leukocytes,Ua: NEGATIVE
Nitrite: NEGATIVE
Protein, ur: NEGATIVE mg/dL
Specific Gravity, Urine: 1.002 — ABNORMAL LOW (ref 1.005–1.030)
pH: 7 (ref 5.0–8.0)

## 2022-05-11 LAB — URINE DRUG SCREEN, QUALITATIVE (ARMC ONLY)
Amphetamines, Ur Screen: NOT DETECTED
Barbiturates, Ur Screen: NOT DETECTED
Benzodiazepine, Ur Scrn: NOT DETECTED
Cannabinoid 50 Ng, Ur ~~LOC~~: NOT DETECTED
Cocaine Metabolite,Ur ~~LOC~~: NOT DETECTED
MDMA (Ecstasy)Ur Screen: NOT DETECTED
Methadone Scn, Ur: NOT DETECTED
Opiate, Ur Screen: NOT DETECTED
Phencyclidine (PCP) Ur S: NOT DETECTED
Tricyclic, Ur Screen: NOT DETECTED

## 2022-05-11 LAB — COMPREHENSIVE METABOLIC PANEL
ALT: 77 U/L — ABNORMAL HIGH (ref 0–44)
AST: 74 U/L — ABNORMAL HIGH (ref 15–41)
Albumin: 5.4 g/dL — ABNORMAL HIGH (ref 3.5–5.0)
Alkaline Phosphatase: 118 U/L (ref 38–126)
Anion gap: 13 (ref 5–15)
BUN: <5 mg/dL — ABNORMAL LOW (ref 8–23)
CO2: 27 mmol/L (ref 22–32)
Calcium: 8.7 mg/dL — ABNORMAL LOW (ref 8.9–10.3)
Chloride: 87 mmol/L — ABNORMAL LOW (ref 98–111)
Creatinine, Ser: 0.53 mg/dL — ABNORMAL LOW (ref 0.61–1.24)
GFR, Estimated: >60 mL/min (ref >60)
Glucose, Bld: 91 mg/dL (ref 70–99)
Potassium: 3.6 mmol/L (ref 3.5–5.1)
Sodium: 127 mmol/L — ABNORMAL LOW (ref 135–145)
Total Bilirubin: 1 mg/dL (ref 0.3–1.2)
Total Protein: 8.8 g/dL — ABNORMAL HIGH (ref 6.5–8.1)

## 2022-05-11 LAB — CBC WITH DIFFERENTIAL/PLATELET
Abs Immature Granulocytes: 0.01 10*3/uL (ref 0.00–0.07)
Basophils Absolute: 0.1 10*3/uL (ref 0.0–0.1)
Basophils Relative: 1 %
Eosinophils Absolute: 0 10*3/uL (ref 0.0–0.5)
Eosinophils Relative: 0 %
HCT: 43.2 % (ref 39.0–52.0)
Hemoglobin: 14.6 g/dL (ref 13.0–17.0)
Immature Granulocytes: 0 %
Lymphocytes Relative: 58 %
Lymphs Abs: 6.5 10*3/uL — ABNORMAL HIGH (ref 0.7–4.0)
MCH: 30.9 pg (ref 26.0–34.0)
MCHC: 33.8 g/dL (ref 30.0–36.0)
MCV: 91.3 fL (ref 80.0–100.0)
Monocytes Absolute: 0.3 10*3/uL (ref 0.1–1.0)
Monocytes Relative: 3 %
Neutro Abs: 4.2 10*3/uL (ref 1.7–7.7)
Neutrophils Relative %: 38 %
Platelets: 271 10*3/uL (ref 150–400)
RBC: 4.73 MIL/uL (ref 4.22–5.81)
RDW: 12.3 % (ref 11.5–15.5)
WBC: 11.2 10*3/uL — ABNORMAL HIGH (ref 4.0–10.5)
nRBC: 0 % (ref 0.0–0.2)

## 2022-05-11 LAB — AMMONIA: Ammonia: 10 umol/L (ref 9–35)

## 2022-05-11 LAB — MAGNESIUM: Magnesium: 2.1 mg/dL (ref 1.7–2.4)

## 2022-05-11 LAB — PROCALCITONIN
Procalcitonin: <0.1 ng/mL
Procalcitonin: <0.1 ng/mL

## 2022-05-11 LAB — ETHANOL: Alcohol, Ethyl (B): 331 mg/dL (ref <10)

## 2022-05-11 MED ORDER — LORAZEPAM 2 MG/ML IJ SOLN: 1.0000 mg | INTRAMUSCULAR | Status: AC | PRN

## 2022-05-11 MED ORDER — LORAZEPAM 2 MG PO TABS
0.0000 mg | ORAL_TABLET | Freq: Four times a day (QID) | ORAL | Status: DC
  Administered 2022-05-11: 2 mg via ORAL
  Administered 2022-05-11: 1 mg via ORAL
  Filled 2022-05-11 (×4): qty 1

## 2022-05-11 MED ORDER — ONDANSETRON HCL 4 MG PO TABS: 4.0000 mg | ORAL_TABLET | Freq: Four times a day (QID) | ORAL | Status: DC | PRN

## 2022-05-11 MED ORDER — ENOXAPARIN SODIUM 40 MG/0.4ML IJ SOSY
40.0000 mg | PREFILLED_SYRINGE | INTRAMUSCULAR | Status: DC
Start: 2022-05-11 — End: 2022-05-19
  Administered 2022-05-11 – 2022-05-19 (×9): 40 mg via SUBCUTANEOUS
  Filled 2022-05-11 (×9): qty 0.4

## 2022-05-11 MED ORDER — LORAZEPAM 1 MG PO TABS
1.0000 mg | ORAL_TABLET | ORAL | Status: AC | PRN
  Administered 2022-05-12 (×3): 1 mg via ORAL
  Filled 2022-05-11: qty 1

## 2022-05-11 MED ORDER — CHLORDIAZEPOXIDE HCL 25 MG PO CAPS
25.0000 mg | ORAL_CAPSULE | Freq: Once | ORAL | Status: AC
  Administered 2022-05-11: 25 mg via ORAL
  Filled 2022-05-11: qty 1

## 2022-05-11 MED ORDER — FOLIC ACID 1 MG PO TABS
1.0000 mg | ORAL_TABLET | Freq: Every day | ORAL | Status: DC
  Administered 2022-05-11 – 2022-05-19 (×9): 1 mg via ORAL
  Filled 2022-05-11 (×9): qty 1

## 2022-05-11 MED ORDER — THIAMINE HCL 100 MG PO TABS
100.0000 mg | ORAL_TABLET | Freq: Every day | ORAL | Status: DC
  Administered 2022-05-11 – 2022-05-19 (×9): 100 mg via ORAL
  Filled 2022-05-11 (×9): qty 1

## 2022-05-11 MED ORDER — METOCLOPRAMIDE HCL 5 MG/ML IJ SOLN
10.0000 mg | Freq: Once | INTRAMUSCULAR | Status: AC
  Administered 2022-05-11: 10 mg via INTRAVENOUS
  Filled 2022-05-11: qty 2

## 2022-05-11 MED ORDER — LORAZEPAM 2 MG PO TABS
0.0000 mg | ORAL_TABLET | Freq: Two times a day (BID) | ORAL | Status: DC
  Administered 2022-05-14: 1 mg via ORAL
  Filled 2022-05-11: qty 1

## 2022-05-11 MED ORDER — ONDANSETRON HCL 4 MG/2ML IJ SOLN: 4.0000 mg | Freq: Four times a day (QID) | INTRAMUSCULAR | Status: DC | PRN

## 2022-05-11 MED ORDER — ACETAMINOPHEN 650 MG RE SUPP: 650.0000 mg | Freq: Four times a day (QID) | RECTAL | Status: DC | PRN

## 2022-05-11 MED ORDER — THIAMINE HCL 100 MG/ML IJ SOLN
100.0000 mg | Freq: Every day | INTRAMUSCULAR | Status: DC
  Filled 2022-05-11: qty 2

## 2022-05-11 MED ORDER — ACETAMINOPHEN 325 MG PO TABS
650.0000 mg | ORAL_TABLET | Freq: Four times a day (QID) | ORAL | Status: DC | PRN
  Administered 2022-05-11 – 2022-05-18 (×6): 650 mg via ORAL
  Filled 2022-05-11 (×6): qty 2

## 2022-05-11 MED ORDER — ADULT MULTIVITAMIN W/MINERALS CH
1.0000 | ORAL_TABLET | Freq: Every day | ORAL | Status: DC
Start: 2022-05-11 — End: 2022-05-19
  Administered 2022-05-11 – 2022-05-19 (×9): 1 via ORAL
  Filled 2022-05-11 (×9): qty 1

## 2022-05-11 NOTE — Assessment & Plan Note (Deleted)
History of recurrent hospitalizations for acute medical problems related to alcohol use

## 2022-05-11 NOTE — Assessment & Plan Note (Signed)
[name] not currently on blood thinners or beta-blockers Given precarious home social situation will hold off on consideration of systemic anticoagulation

## 2022-05-11 NOTE — Assessment & Plan Note (Signed)
Sodium 127, down from 134 couple weeks prior Likely hypotonic related to alcohol use/beer [place] mania We will continue to monitor for now.  Consider salt tablets of downtrending

## 2022-05-11 NOTE — Assessment & Plan Note (Signed)
Stable.  Not currently on meds

## 2022-05-11 NOTE — ED Notes (Signed)
Latoya bishop with [facility] notified of pt arrival and pt conditionl.

## 2022-05-11 NOTE — Assessment & Plan Note (Signed)
Wound care eval Patient with maggot infestation below toenails in between toes
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
## 2022-05-11 NOTE — Assessment & Plan Note (Addendum)
Alcohol use disorder, severe History of recurrent hospitalizations for acute medical problems related to alcohol use EtOH level over 300 EtOH withdrawal prevention

## 2022-05-11 NOTE — ED Notes (Signed)
Pt has pulled off condom cath that tiffany rn just place, new small condom cath placed. Pt informed to leave in place.

## 2022-05-11 NOTE — ED Notes (Signed)
Call placed to [facility] [place], per worker on call they are his gaurdians, she will place call to oncall worker to notify.
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
## 2022-05-11 NOTE — Assessment & Plan Note (Deleted)
Secondary to poor nutrition from alcohol abuse, hypokalemia PT/OT and TOC consults

## 2022-05-11 NOTE — Assessment & Plan Note (Signed)
Chronic and stable.   

## 2022-05-11 NOTE — Assessment & Plan Note (Addendum)
Generalized weakness Patient was found in decrepit conditions with urine and feces and empty beer cans around him Patient undernourished from chronic alcohol use PT OT and TOC consult Nutritionist consult

## 2022-05-12 DIAGNOSIS — I480 Paroxysmal atrial fibrillation: Secondary | ICD-10-CM | POA: Diagnosis not present

## 2022-05-12 DIAGNOSIS — E871 Hypo-osmolality and hyponatremia: Secondary | ICD-10-CM | POA: Diagnosis not present

## 2022-05-12 DIAGNOSIS — F1020 Alcohol dependence, uncomplicated: Secondary | ICD-10-CM | POA: Diagnosis not present

## 2022-05-12 DIAGNOSIS — R748 Abnormal levels of other serum enzymes: Secondary | ICD-10-CM | POA: Diagnosis not present

## 2022-05-12 LAB — BASIC METABOLIC PANEL
Anion gap: 8 (ref 5–15)
BUN: 10 mg/dL (ref 8–23)
CO2: 26 mmol/L (ref 22–32)
Calcium: 8.9 mg/dL (ref 8.9–10.3)
Chloride: 100 mmol/L (ref 98–111)
Creatinine, Ser: 0.65 mg/dL (ref 0.61–1.24)
GFR, Estimated: >60 mL/min (ref >60)
Glucose, Bld: 96 mg/dL (ref 70–99)
Potassium: 3.4 mmol/L — ABNORMAL LOW (ref 3.5–5.1)
Sodium: 134 mmol/L — ABNORMAL LOW (ref 135–145)

## 2022-05-12 LAB — MAGNESIUM: Magnesium: 1.9 mg/dL (ref 1.7–2.4)

## 2022-05-12 LAB — GLUCOSE, CAPILLARY: Glucose-Capillary: 154 mg/dL — ABNORMAL HIGH (ref 70–99)

## 2022-05-12 LAB — PHOSPHORUS: Phosphorus: 2.9 mg/dL (ref 2.5–4.6)

## 2022-05-12 LAB — PROCALCITONIN: Procalcitonin: 0.2 ng/mL

## 2022-05-12 MED ORDER — POTASSIUM CHLORIDE CRYS ER 20 MEQ PO TBCR
40.0000 meq | EXTENDED_RELEASE_TABLET | Freq: Once | ORAL | Status: AC
Start: 2022-05-12 — End: 2022-05-12
  Administered 2022-05-12: 40 meq via ORAL
  Filled 2022-05-12: qty 2

## 2022-05-12 MED ORDER — POLYETHYLENE GLYCOL 3350 17 G PO PACK
17.0000 g | PACK | Freq: Every day | ORAL | Status: DC | PRN
  Administered 2022-05-12 – 2022-05-18 (×4): 17 g via ORAL
  Filled 2022-05-12 (×3): qty 1

## 2022-05-12 MED ORDER — BACLOFEN 10 MG PO TABS
10.0000 mg | ORAL_TABLET | Freq: Three times a day (TID) | ORAL | Status: DC
Start: 2022-05-12 — End: 2022-05-19
  Administered 2022-05-12 – 2022-05-19 (×21): 10 mg via ORAL
  Filled 2022-05-12 (×22): qty 1

## 2022-05-12 MED ORDER — ATENOLOL 25 MG PO TABS
25.0000 mg | ORAL_TABLET | Freq: Every day | ORAL | Status: DC
  Administered 2022-05-12 – 2022-05-19 (×8): 25 mg via ORAL
  Filled 2022-05-12 (×8): qty 1

## 2022-05-12 MED ORDER — ALUM & MAG HYDROXIDE-SIMETH 200-200-20 MG/5ML PO SUSP
30.0000 mL | ORAL | Status: DC | PRN
  Administered 2022-05-12: 30 mL via ORAL
  Filled 2022-05-12: qty 30

## 2022-05-12 MED ORDER — ATORVASTATIN CALCIUM 10 MG PO TABS
10.0000 mg | ORAL_TABLET | Freq: Every day | ORAL | Status: DC
  Administered 2022-05-12 – 2022-05-19 (×8): 10 mg via ORAL
  Filled 2022-05-12 (×8): qty 1
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
## 2022-05-12 NOTE — Plan of Care (Signed)

## 2022-05-12 NOTE — Plan of Care (Signed)
  Problem: Activity: Goal: Risk for activity intolerance will decrease 05/12/2022 1641 by Marisa Sprinkles, LPN Outcome: Progressing 05/12/2022 1127 by Marisa Sprinkles, LPN Outcome: Progressing   Problem: Nutrition: Goal: Adequate nutrition will be maintained 05/12/2022 1641 by Marisa Sprinkles, LPN Outcome: Progressing 05/12/2022 1127 by Marisa Sprinkles, LPN Outcome: Progressing   Problem: Coping: Goal: Level of anxiety will decrease 05/12/2022 1641 by Marisa Sprinkles, LPN Outcome: Progressing 05/12/2022 1127 by Marisa Sprinkles, LPN Outcome: Progressing   Problem: Elimination: Goal: Will not experience complications related to bowel motility 05/12/2022 1641 by Marisa Sprinkles, LPN Outcome: Progressing 05/12/2022 1127 by Marisa Sprinkles, LPN Outcome: Progressing Goal: Will not experience complications related to urinary retention 05/12/2022 1641 by Marisa Sprinkles, LPN Outcome: Progressing 05/12/2022 1127 by Marisa Sprinkles, LPN Outcome: Progressing

## 2022-05-12 NOTE — Consult Note (Addendum)
[place] Nurse Consult Note: Reason for Consult: Consult requested for bilat feet.  Pt was noted to have maceration between toe spaces upon admission and maggots underneath the right great toe nail.  Wound type: Bedside nurses have cleaned and dried the affected area to BLE and after assessment of the left foot, there are no open wounds or drainage.  Skin is intact and no topical treatment is indicated. No further maggots are visible at this time. All toe mails are 100% yellow and crumbling; appearance is consistent with onychomycosis. Primary team; please order a podiatry consult if desired, since the right great toenail is not viable and will eventually fall off and evolve into a full thickness wound.  Right great toe nail is raised and unattached to 50% of the skin beneath, 100% yellow moist wound bed is visible. No further open wounds between toe spaces, but skin remains red, moist and macerated.  Topical treatment orders provided for bedside nurses to perform as follows: Apply Betadine ointment to right great toe Q day, then thread gauze between toe spaces and over right great toe. Please re-consult if further assistance is needed.  Thank-you,  Julien Girt MSN, [place], [place], [place], [place]

## 2022-05-13 DIAGNOSIS — B879 Myiasis, unspecified: Secondary | ICD-10-CM | POA: Diagnosis not present

## 2022-05-13 DIAGNOSIS — F1020 Alcohol dependence, uncomplicated: Secondary | ICD-10-CM | POA: Diagnosis not present

## 2022-05-13 DIAGNOSIS — R531 Weakness: Secondary | ICD-10-CM | POA: Diagnosis not present

## 2022-05-13 DIAGNOSIS — E871 Hypo-osmolality and hyponatremia: Secondary | ICD-10-CM | POA: Diagnosis not present

## 2022-05-13 LAB — MAGNESIUM: Magnesium: 1.9 mg/dL (ref 1.7–2.4)

## 2022-05-13 LAB — BASIC METABOLIC PANEL
Anion gap: 7 (ref 5–15)
BUN: 9 mg/dL (ref 8–23)
CO2: 27 mmol/L (ref 22–32)
Calcium: 9.2 mg/dL (ref 8.9–10.3)
Chloride: 103 mmol/L (ref 98–111)
Creatinine, Ser: 0.53 mg/dL — ABNORMAL LOW (ref 0.61–1.24)
GFR, Estimated: >60 mL/min (ref >60)
Glucose, Bld: 100 mg/dL — ABNORMAL HIGH (ref 70–99)
Potassium: 4.1 mmol/L (ref 3.5–5.1)
Sodium: 137 mmol/L (ref 135–145)

## 2022-05-13 NOTE — Plan of Care (Signed)
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
## 2022-05-13 NOTE — Plan of Care (Signed)

## 2022-05-14 DIAGNOSIS — E871 Hypo-osmolality and hyponatremia: Secondary | ICD-10-CM | POA: Diagnosis not present

## 2022-05-14 NOTE — Progress Notes (Signed)
Mobility Specialist - Progress Note   05/14/22 1734  Mobility  Activity Ambulated with assistance in hallway;Stood at bedside  Level of Assistance Contact guard assist, steadying assist  Assistive Device Cane  Distance Ambulated (ft) 80 ft  Activity Response Tolerated well  $Mobility charge 1 Mobility     Pt sitting in recliner upon arrival using RA. Completes STS MinA and ambulates 67f CGA/MinA -- one mild LOB corrected by mobility specialist. Denies dizziness and pain. Pt returns to recliner with needs in reach and chair alarm set.  MMerrily BrittleMobility Specialist 05/14/22, 5:36 PM

## 2022-05-14 NOTE — Care Management Important Message (Signed)
Important Message  Patient Details  Name: Douglas Peterson. MRN: [id number] Date of Birth: 1951/06/13   Medicare Important Message Given:  N/A - LOS <3 / Initial given by admissions     Juliann Pulse A  05/14/2022, 8:46 AM

## 2022-05-14 NOTE — Evaluation (Signed)
Physical Therapy Evaluation Patient Details Name: Douglas Peterson. MRN: [id number] DOB: 1951/05/25 Today's Date: 05/14/2022  History of Present Illness  presented to ER secondary to generalized weakness, inability to manage care in home environment, wound to R foot (with maggots in wound); admitted for management of acute hyponatremia related to chronic ETOH abuse.  R toenails s/p debridement by podiatry.  Clinical Impression  Patient resting in bed upon arrival to room; alert and oriented, follows commands and eager for OOB activities.  Denies pain.  Baseline R UE flexor positioning (partial contracture) with significant R hand fisting noted (increased with exertion); washcloth roll placed under fingers and between thumb/index finger for skin protection.  R LE with mild weakness, but grossly functional for basic transfers and gait.  Able to complete bed mobility with mod indep; sit/stand, gait (120') with SPC, cga/min assist.  Demonstrates partially reciprocal stepping pattern with excessive R LE ER throughout gait cycle (tends to WB on medial aspect of foot).  Very slow and deliberate, poor balance reactions; mildly tremulous R > L LE in loading.  Cga/min assist throughout for safety.  Anticipate further improvement in safety/indep with continued mobility opportunity. Would benefit from skilled PT to address above deficits and promote optimal return to PLOF.; Recommend transition to HHPT upon discharge from acute hospitalization.      Recommendations for follow up therapy are one component of a multi-disciplinary discharge planning process, led by the attending physician.  Recommendations may be updated based on patient status, additional functional criteria and insurance authorization.  Follow Up Recommendations Home health PT      Assistance Recommended at Discharge Intermittent Supervision/Assistance  Patient can return home with the following  A little help with walking and/or transfers;A  little help with bathing/dressing/bathroom    Equipment Recommendations  (has SPC)  Recommendations for Other Services       Functional Status Assessment Patient has had a recent decline in their functional status and demonstrates the ability to make significant improvements in function in a reasonable and predictable amount of time.     Precautions / Restrictions Precautions Precautions: Fall Restrictions Weight Bearing Restrictions: No      Mobility  Bed Mobility Overal bed mobility: Modified Independent Bed Mobility: Supine to Sit     Supine to sit: Modified independent (Device/Increase time)          Transfers Overall transfer level: Needs assistance Equipment used: Straight cane Transfers: Sit to/from Stand Sit to Stand: Min guard, Min assist           General transfer comment: min assist for initial sit/stand (cuing/assist for anterior weight translation and initial balance)    Ambulation/Gait Ambulation/Gait assistance: Min guard, Min assist Gait Distance (Feet): 120 Feet Assistive device: Straight cane         General Gait Details: partially reciprocal stepping pattern with excessive R LE ER throughout gait cycle (tends to WB on medial aspect of foot).  Very slow and deliberate, poor balance reactions; mildly tremulous R > L LE in loading.  Cga/min assist throughout for safety.  Stairs            Wheelchair Mobility    Modified Rankin (Stroke Patients Only)       Balance Overall balance assessment: Needs assistance Sitting-balance support: No upper extremity supported, Feet supported Sitting balance-Leahy Scale: Good     Standing balance support: Single extremity supported Standing balance-Leahy Scale: Fair  Pertinent Vitals/Pain Pain Assessment Pain Assessment: No/denies pain    Home Living Family/patient expects to be discharged to:: Private residence Living Arrangements:  Alone Available Help at Discharge: Available PRN/intermittently;Family Type of Home: House Home Access: Stairs to enter Entrance Stairs-Rails: Can reach both Entrance Stairs-Number of Steps: 2   Home Layout: One level Home Equipment: Conservation officer, nature (2 wheels);Cane - quad;BSC/3in1;Shower seat;Cane - single point      Prior Function Prior Level of Function : Independent/Modified Independent             Mobility Comments: Mod indep for household, limited community mobilization with SPC; occasional gait "without my cane" inside the home ADLs Comments: Mod indep with ADLs; endorses grocery delivery service     Hand Dominance   Dominant Hand: Left    Extremity/Trunk Assessment   Upper Extremity Assessment Upper Extremity Assessment:  (R UE in flexed posturing with significant fisting of hand, can volitionally relax through minimal range, but tone returns with exertion.  L UE grossly WFL)    Lower Extremity Assessment Lower Extremity Assessment:  (R LE grossly 3/5 throughout, increased time/effort for R foot DF to neutral. L LE grossly WFL)       Communication   Communication: No difficulties  Cognition Arousal/Alertness: Awake/alert Behavior During Therapy: WFL for tasks assessed/performed Overall Cognitive Status: Within Functional Limits for tasks assessed                                          General Comments      Exercises Other Exercises Other Exercises: Discussed importance of footwear to protect toes/wounds and to optimize stability; patient understanding Other Exercises: Discussed importance of ETOH cessation to improve health and indep, to safely remain home upon discharge; patient voiced understanding and agreement.  Patient with questions about medication "to make me not want to drink"-encouraged to discuss with physician   Assessment/Plan    PT Assessment Patient needs continued PT services  PT Problem List Decreased safety  awareness;Decreased mobility;Decreased strength;Decreased range of motion;Decreased activity tolerance;Decreased cognition;Decreased balance       PT Treatment Interventions DME instruction;Therapeutic exercise;Gait training;Balance training;Stair training;Functional mobility training;Therapeutic activities;Patient/family education;Neuromuscular re-education    PT Goals (Current goals can be found in the Care Plan section)  Acute Rehab PT Goals Patient Stated Goal: "to stay one more day because I need to be able to walk better" PT Goal Formulation: With patient Time For Goal Achievement: 05/28/22 Potential to Achieve Goals: Fair    Frequency Min 2X/week     Co-evaluation               AM-PAC PT "6 Clicks" Mobility  Outcome Measure Help needed turning from your back to your side while in a flat bed without using bedrails?: None Help needed moving from lying on your back to sitting on the side of a flat bed without using bedrails?: None Help needed moving to and from a bed to a chair (including a wheelchair)?: A Little Help needed standing up from a chair using your arms (e.g., wheelchair or bedside chair)?: A Little Help needed to walk in hospital room?: A Little Help needed climbing 3-5 steps with a railing? : A Lot 6 Click Score: 19    End of Session Equipment Utilized During Treatment: Gait belt Activity Tolerance: Patient tolerated treatment well Patient left: in chair;with call bell/phone within reach;with chair alarm set Nurse  Communication: Mobility status PT Visit Diagnosis: Unsteadiness on feet (R26.81);Muscle weakness (generalized) (M62.81);Repeated falls (R29.6);Difficulty in walking, not elsewhere classified (R26.2)    Time: 1510-1540 PT Time Calculation (min) (ACUTE ONLY): 30 min   Charges:   PT Evaluation $PT Eval Moderate Complexity: 1 Mod           H. Owens Shark, PT, DPT, NCS 05/14/22, 9:09 PM [phone]

## 2022-05-14 NOTE — Plan of Care (Signed)
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
## 2022-05-15 DIAGNOSIS — E871 Hypo-osmolality and hyponatremia: Secondary | ICD-10-CM | POA: Diagnosis not present
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
## 2022-05-15 NOTE — Progress Notes (Addendum)
Mobility Specialist - Progress Note   05/15/22 1029  Mobility  Activity Ambulated with assistance in hallway  Level of Assistance Standby assist, set-up cues, supervision of patient - no hands on  Assistive Device Cane  Distance Ambulated (ft) 330 ft  Activity Response Tolerated well  $Mobility charge 1 Mobility     Pt seated in recliner upon arrival, utilizing RA. Pt a little upset on arrival, voices concerns about plan to d/c home soon as he feels it's unsafe; active listening provided. Pt stood and ambulated into hallway with supervision, completing 2 laps around nurses station. 1 mild LOB. Fatigued with activity. Upon return to room, pt practiced transfers from chair-bed with use of bed rails. Pt left semi-supine with alarm set, needs in reach.    Kathee Delton Mobility Specialist 05/15/22, 10:52 AM

## 2022-05-15 NOTE — Progress Notes (Signed)
Physical Therapy Treatment Patient Details Name: Douglas Peterson. MRN: [id number] DOB: 1951/02/09 Today's Date: 05/15/2022   History of Present Illness presented to ER secondary to generalized weakness, inability to manage care in home environment, wound to R foot (with maggots in wound); admitted for management of acute hyponatremia related to chronic ETOH abuse.  R toenails s/p debridement by podiatry.    PT Comments    3 laps on unit.  Limited by soreness R foot with increased distances.  No shoes in facility.    Recommendations for follow up therapy are one component of a multi-disciplinary discharge planning process, led by the attending physician.  Recommendations may be updated based on patient status, additional functional criteria and insurance authorization.  Follow Up Recommendations  Home health PT     Assistance Recommended at Discharge Intermittent Supervision/Assistance  Patient can return home with the following A little help with walking and/or transfers;A little help with bathing/dressing/bathroom   Equipment Recommendations   (has SPC)    Recommendations for Other Services       Precautions / Restrictions Precautions Precautions: Fall Restrictions Weight Bearing Restrictions: Yes RUE Weight Bearing: Weight bearing as tolerated LUE Weight Bearing: Weight bearing as tolerated     Mobility  Bed Mobility Overal bed mobility: Modified Independent                  Transfers Overall transfer level: Needs assistance Equipment used: Straight cane Transfers: Sit to/from Stand Sit to Stand: Min guard, Min assist                Ambulation/Gait   Gait Distance (Feet): 600 Feet Assistive device: Straight cane Gait Pattern/deviations: Step-through pattern, Decreased stance time - right, Trunk flexed, Decreased step length - left Gait velocity: decreased     General Gait Details: occasional imbalances but able to self correct   Stairs              Wheelchair Mobility    Modified Rankin (Stroke Patients Only)       Balance Overall balance assessment: Needs assistance Sitting-balance support: No upper extremity supported, Feet supported Sitting balance-Leahy Scale: Good     Standing balance support: Single extremity supported Standing balance-Leahy Scale: Fair                              Cognition Arousal/Alertness: Awake/alert Behavior During Therapy: WFL for tasks assessed/performed Overall Cognitive Status: Within Functional Limits for tasks assessed                                          Exercises      General Comments        Pertinent Vitals/Pain Pain Assessment Pain Assessment: Faces Faces Pain Scale: Hurts little more Pain Location: Foot with longer distances Pain Descriptors / Indicators: Sore Pain Intervention(s): Limited activity within patient's tolerance, Monitored during session    Home Living                          Prior Function            PT Goals (current goals can now be found in the care plan section) Progress towards PT goals: Progressing toward goals    Frequency    Min 2X/week      PT Plan  Co-evaluation              AM-PAC PT "6 Clicks" Mobility   Outcome Measure  Help needed turning from your back to your side while in a flat bed without using bedrails?: None Help needed moving from lying on your back to sitting on the side of a flat bed without using bedrails?: None Help needed moving to and from a bed to a chair (including a wheelchair)?: A Little Help needed standing up from a chair using your arms (e.g., wheelchair or bedside chair)?: A Little Help needed to walk in hospital room?: A Little Help needed climbing 3-5 steps with a railing? : A Lot 6 Click Score: 19    End of Session Equipment Utilized During Treatment: Gait belt Activity Tolerance: Patient tolerated treatment well Patient left: in  chair;with call bell/phone within reach;with chair alarm set Nurse Communication: Mobility status PT Visit Diagnosis: Unsteadiness on feet (R26.81);Muscle weakness (generalized) (M62.81);Repeated falls (R29.6);Difficulty in walking, not elsewhere classified (R26.2)     Time: 9528-4132 PT Time Calculation (min) (ACUTE ONLY): 25 min  Charges:  $Gait Training: 23-37 mins                   Chesley Noon, PTA 05/15/22, 12:21 PM

## 2022-05-15 NOTE — TOC Progression Note (Signed)
Transition of Care (TOC) - Progression Note    Patient Details  Name: Douglas Peterson. MRN: [id number] Date of Birth: [birth year]  Transition of Care [facility]) CM/SW Contact  Shelbie Hutching, RN Phone Number: 05/15/2022, 3:56 PM  Clinical Narrative:    This RNCM was contacted by patient's guardian ad litem, Therese Sarah.  [name] has spoken with patient about getting care at home, patient is interested in having someone come out for a few hours everyday to help him at home.  RNCM has given referral to [place] Meux with [employer].  She has spoken with patient and will be working with him to set up PCS in the home.  Plan for discharge tomorrow.          Expected Discharge Plan and Services                                                 Social Determinants of Health (SDOH) Interventions    Readmission Risk Interventions    04/29/2022   10:26 AM 03/14/2022   12:48 PM 08/23/2020   11:52 AM  Readmission Risk Prevention Plan  Transportation Screening Complete Complete Complete  PCP or Specialist Appt within 3-5 Days  Complete   HRI or [place] Complete Complete   Social Work Consult for [place] Planning/Counseling Complete Complete   Palliative Care Screening Not Applicable Not Applicable   Medication Review Press photographer) Complete Complete Complete  HRI or [place]   Not Applicable

## 2022-05-15 NOTE — Plan of Care (Signed)
  Problem: Education: Goal: Knowledge of General Education information will improve Description: Including pain rating scale, medication(s)/side effects and non-pharmacologic comfort measures Outcome: Progressing   Problem: Activity: Goal: Risk for activity intolerance will decrease Outcome: Progressing   Problem: Pain Managment: Goal: General experience of comfort will improve Outcome: Progressing   Problem: Safety: Goal: Ability to remain free from injury will improve Outcome: Progressing   

## 2022-05-15 NOTE — Progress Notes (Signed)
Mobility Specialist - Progress Note    05/15/22 1400  Mobility  Activity Ambulated with assistance in hallway  Level of Assistance Minimal assist, patient does 75% or more  Assistive Device Cane  Distance Ambulated (ft) 180 ft  Activity Response Tolerated well  $Mobility charge 1 Mobility    Merrily Brittle Mobility Specialist 05/15/22, 2:30 PM

## 2022-05-15 NOTE — Care Management Important Message (Signed)
Important Message  Patient Details  Name: Douglas Peterson. MRN: [id number] Date of Birth: Dec 15, 1950   Medicare Important Message Given:  Yes     Juliann Pulse A  05/15/2022, 2:46 PM

## 2022-05-16 DIAGNOSIS — E871 Hypo-osmolality and hyponatremia: Secondary | ICD-10-CM | POA: Diagnosis not present

## 2022-05-16 MED ORDER — ATENOLOL 25 MG PO TABS: 25.0000 mg | ORAL_TABLET | Freq: Every day | ORAL | 0 refills | Status: DC

## 2022-05-16 MED ORDER — OXYMETAZOLINE HCL 0.05 % NA SOLN
2.0000 | Freq: Two times a day (BID) | NASAL | Status: AC
  Administered 2022-05-16 – 2022-05-18 (×4): 2 via NASAL
  Filled 2022-05-16: qty 15

## 2022-05-16 MED ORDER — SALINE SPRAY 0.65 % NA SOLN
1.0000 | NASAL | Status: DC | PRN
  Administered 2022-05-16: 1 via NASAL
  Filled 2022-05-16: qty 44
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
## 2022-05-16 NOTE — Progress Notes (Addendum)
Mobility Specialist - Progress Note      05/16/22 1415  Mobility  Activity Ambulated with assistance in hallway  Level of Assistance Contact guard assist, steadying assist  Assistive Device Cane  Distance Ambulated (ft) 260 ft  Activity Response Tolerated well  $Mobility charge 1 Mobility   Pt supine upon entry. Pt was using RA. Author noticed Pt having active nose bleeding, RN notified. Pt used extra time to get out of bed with bed rail, 1 mild LOB. Pt ambulated 260 feet no complaints. Pt left supine with alarm set and needs in reach.   Note written by Mobility Specialist-Nastasia; supervised by Jeanell Sparrow Mobility Specialist 05/16/22, 2:26 PM

## 2022-05-16 NOTE — Progress Notes (Signed)
Mobility Specialist - Progress Note    05/16/22 1000  Mobility  Activity Ambulated with assistance in hallway  Level of Assistance Minimal assist, patient does 75% or more  Assistive Device Cane  Distance Ambulated (ft) 160 ft  Activity Response Tolerated well  $Mobility charge 1 Mobility    Merrily Brittle Mobility Specialist 05/16/22, 10:04 AM

## 2022-05-16 NOTE — TOC Progression Note (Signed)
Transition of Care (TOC) - Progression Note    Patient Details  Name: Douglas Peterson. MRN: [id number] Date of Birth: [birth year]  [facility]) CM/SW Contact  Eileen Stanford, LCSW Phone Number: 05/16/2022, 12:23 PM  Clinical Narrative:    Appeal of discharge in progress.   Case ID [id number]        Expected Discharge Plan and Services           Expected Discharge Date: 05/16/22                                     Social Determinants of Health (SDOH) Interventions    Readmission Risk Interventions    04/29/2022   10:26 AM 03/14/2022   12:48 PM 08/23/2020   11:52 AM  Readmission Risk Prevention Plan  Transportation Screening Complete Complete Complete  PCP or Specialist Appt within 3-5 Days  Complete   HRI or [place] Complete Complete   Social Work Consult for [place] Planning/Counseling Complete Complete   Palliative Care Screening Not Applicable Not Applicable   Medication Review Press photographer) Complete Complete Complete  HRI or [place]   Not Applicable

## 2022-05-16 NOTE — Discharge Instructions (Signed)
F/u PCP in 1-2 weeks  Per Dr Cleda Mccreedy :Apply Betadine ointment to right great toe Q day, then thread gauze between toe spaces and over right great toe.

## 2022-05-16 NOTE — Plan of Care (Signed)
  Problem: Education: Goal: Knowledge of General Education information will improve Description: Including pain rating scale, medication(s)/side effects and non-pharmacologic comfort measures Outcome: Progressing   Problem: Health Behavior/Discharge Planning: Goal: Ability to manage health-related needs will improve Outcome: Progressing   Problem: Clinical Measurements: Goal: Ability to maintain clinical measurements within normal limits will improve Outcome: Progressing Goal: Will remain free from infection Outcome: Progressing   Problem: Activity: Goal: Risk for activity intolerance will decrease Outcome: Progressing   Problem: Nutrition: Goal: Adequate nutrition will be maintained Outcome: Progressing   Problem: Pain Managment: Goal: General experience of comfort will improve Outcome: Progressing

## 2022-05-17 DIAGNOSIS — E871 Hypo-osmolality and hyponatremia: Secondary | ICD-10-CM | POA: Diagnosis not present
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
## 2022-05-17 NOTE — TOC Progression Note (Signed)
Transition of Care (TOC) - Progression Note    Patient Details  Name: Douglas Peterson. MRN: [id number] Date of Birth: [birth year]  Transition of Care [facility]) CM/SW Contact  Boris Sharper, LCSW Phone Number: 05/17/2022, 12:25 PM  Clinical Narrative:    CSW provided pt with appeal denial letter. Pt does not feel that he is ready to discharge. CSW received a call from Gwendolyn Grant with [employer] regarding pt's care and discharge. Pt has refused to sign contract for care with [facility]. Pt stated that he will need 24hr care immediately after leaving the hospital. Pt also stated this is home needs to be cleaned. CSW notified Andee Poles of pt's concerns. CSW spoke with [facility] representative Tim while conferenced in with Andee Poles and was notified that the team was headed to the pt's home to assess the level of cleaning that needed to take place.   CSW notified the pt. Pt stated that as long as he will receive 24 hr care upon discharge and the home was suitable he will sign the contract.  12:50pm Danielle  notified CSW that the pt's home would need to be deep cleaned and was currently not suitable to provide care. [place] is willing to hire professional cleaning services to complete this task but will need some time. CSW verbalized understanding and notified the pt.        Expected Discharge Plan and Services           Expected Discharge Date: 05/16/22                                     Social Determinants of Health (SDOH) Interventions    Readmission Risk Interventions    04/29/2022   10:26 AM 03/14/2022   12:48 PM 08/23/2020   11:52 AM  Readmission Risk Prevention Plan  Transportation Screening Complete Complete Complete  PCP or Specialist Appt within 3-5 Days  Complete   HRI or [place] Complete Complete   Social Work Consult for [place] Planning/Counseling Complete Complete   Palliative Care Screening Not Applicable Not Applicable    Medication Review Press photographer) Complete Complete Complete  HRI or [place]   Not Applicable
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
## 2022-05-18 DIAGNOSIS — E871 Hypo-osmolality and hyponatremia: Secondary | ICD-10-CM | POA: Diagnosis not present

## 2022-05-18 LAB — CREATININE, SERUM
Creatinine, Ser: 0.51 mg/dL — ABNORMAL LOW (ref 0.61–1.24)
GFR, Estimated: >60 mL/min (ref >60)
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
## 2022-05-18 NOTE — Progress Notes (Signed)
Physical Therapy Treatment Patient Details Name: Douglas Peterson. MRN: [id number] DOB: [birth year] Today's Date: 05/18/2022   History of Present Illness presented to ER secondary to generalized weakness, inability to manage care in home environment, wound to R foot (with maggots in wound); admitted for management of acute hyponatremia related to chronic ETOH abuse.  R toenails s/p debridement by podiatry.    PT Comments    Completed x 3 laps on unit with slow gait.  Occasional imbalances that he corrects with min guard.  Recommendations for follow up therapy are one component of a multi-disciplinary discharge planning process, led by the attending physician.  Recommendations may be updated based on patient status, additional functional criteria and insurance authorization.  Follow Up Recommendations  Home health PT     Assistance Recommended at Discharge Intermittent Supervision/Assistance  Patient can return home with the following A little help with walking and/or transfers;A little help with bathing/dressing/bathroom   Equipment Recommendations       Recommendations for Other Services       Precautions / Restrictions Precautions Precautions: Fall Restrictions Weight Bearing Restrictions: Yes RUE Weight Bearing: Weight bearing as tolerated LUE Weight Bearing: Weight bearing as tolerated     Mobility  Bed Mobility               General bed mobility comments: in chair before and after    Transfers Overall transfer level: Needs assistance Equipment used: Straight cane Transfers: Sit to/from Stand Sit to Stand: Min guard, Min assist                Ambulation/Gait Ambulation/Gait assistance: Min guard, Min assist Gait Distance (Feet): 600 Feet Assistive device: Straight cane Gait Pattern/deviations: Step-through pattern, Decreased stance time - right, Trunk flexed, Decreased step length - left Gait velocity: decreased     General Gait Details:  occasional imbalances but able to self correct   Stairs             Wheelchair Mobility    Modified Rankin (Stroke Patients Only)       Balance Overall balance assessment: Needs assistance Sitting-balance support: No upper extremity supported, Feet supported Sitting balance-Leahy Scale: Good     Standing balance support: Single extremity supported Standing balance-Leahy Scale: Fair                              Cognition Arousal/Alertness: Awake/alert Behavior During Therapy: WFL for tasks assessed/performed Overall Cognitive Status: Within Functional Limits for tasks assessed                                          Exercises      General Comments        Pertinent Vitals/Pain Pain Assessment Pain Assessment: No/denies pain    Home Living                          Prior Function            PT Goals (current goals can now be found in the care plan section) Progress towards PT goals: Progressing toward goals    Frequency    Min 2X/week      PT Plan Current plan remains appropriate    Co-evaluation  AM-PAC PT "6 Clicks" Mobility   Outcome Measure  Help needed turning from your back to your side while in a flat bed without using bedrails?: None Help needed moving from lying on your back to sitting on the side of a flat bed without using bedrails?: None Help needed moving to and from a bed to a chair (including a wheelchair)?: A Little Help needed standing up from a chair using your arms (e.g., wheelchair or bedside chair)?: A Little Help needed to walk in hospital room?: A Little Help needed climbing 3-5 steps with a railing? : A Lot 6 Click Score: 19    End of Session Equipment Utilized During Treatment: Gait belt Activity Tolerance: Patient tolerated treatment well Patient left: in chair;with call bell/phone within reach;with chair alarm set Nurse Communication: Mobility status PT  Visit Diagnosis: Unsteadiness on feet (R26.81);Muscle weakness (generalized) (M62.81);Repeated falls (R29.6);Difficulty in walking, not elsewhere classified (R26.2)     Time: 1504-1364 PT Time Calculation (min) (ACUTE ONLY): 24 min  Charges:  $Gait Training: 23-37 mins                     Chesley Noon, PTA 05/18/22, 11:44 AM

## 2022-05-19 DIAGNOSIS — E871 Hypo-osmolality and hyponatremia: Secondary | ICD-10-CM | POA: Diagnosis not present

## 2022-05-19 NOTE — Plan of Care (Signed)

## 2022-05-19 NOTE — NC FL2 (Signed)
[place] LEVEL OF CARE SCREENING TOOL     IDENTIFICATION  Patient Name: Douglas Peterson. Birthdate: [birth year] Sex: male Admission Date (Current Location): 05/10/2022  [place] and [place] Number:  Engineering geologist and Address:  [facility], [address] [place], [place] [address]      Provider Number: [id number]  Attending Physician Name and Address:  Fritzi Mandes, MD  Relative Name and Phone Number:       Current Level of Care: Hospital Recommended Level of Care: Other (Comment) Prior Approval Number:    Date Approved/Denied:   PASRR Number:    Discharge Plan: Other (Comment)    Current Diagnoses: Patient Active Problem List   Diagnosis Date Noted   Maggot infestation feet 05/11/2022   Unable to care for self 05/11/2022   Debility 04/25/2022   Weakness 04/23/2022   Epistaxis 04/23/2022   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   Generalized weakness 03/18/2022   Nondisplaced fracture of shaft of left clavicle, initial encounter for closed fracture 03/18/2022   BPH (benign prostatic hyperplasia) 03/18/2022   Clavicle fracture 03/17/2022   Fall 03/17/2022   CAP (community acquired pneumonia) 03/13/2022   Altered mental status, unspecified 02/20/2022   Hypotension 02/19/2022   Dysphonia 08/13/2021   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   Paresthesia of right upper extremity 08/13/2021   SIRS (systemic inflammatory response syndrome) ([place]) 08/05/2020   GERD (gastroesophageal reflux disease) 06/05/2020   Alcohol use disorder, severe, dependence ([place]) 05/28/2020   Paroxysmal A-fib ([place]) 03/17/2020   Primary insomnia 02/06/2020   Left inguinal hernia 01/31/2020   Encounter for competency evaluation    Depression 01/17/2020   Pancytopenia ([place])    Hypomagnesemia    Alcoholic cirrhosis of liver without ascites ([place])    ETOH abuse    Alcohol intoxication ([place])    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  ([place]) 09/27/2019   Alcohol withdrawal delirium, acute, hyperactive ([place]) 09/21/2019   Pressure injury of ankle, stage 1 08/15/2019   Palliative care by specialist    Closed fracture of right proximal humerus 03/19/2019   Vitamin D deficiency 01/11/2019   Osteoporosis 12/22/2018   Closed nondisplaced fracture of acromial process of right scapula with routine healing 11/15/2018   Compression fracture of L2 vertebra with routine healing 11/15/2018   Delirium tremens ([place]) 03/08/2018   HTN (hypertension) 09/16/2017   Encephalopathy, 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   Steatohepatitis 12/03/2016   Abnormal liver enzymes 06/17/2016   Hereditary hemochromatosis ([place]) 06/17/2016   Anxiety 07/16/2015   Hyponatremia 07/09/2015   H/O traumatic brain injury 05/22/2014   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/22/2014    Orientation RESPIRATION BLADDER Height & Weight     Self, Time, Situation, Place  Normal Continent Weight: 132 lb 4.4 oz (60 kg) Height:  5' 3"  (160 cm)  BEHAVIORAL SYMPTOMS/MOOD NEUROLOGICAL BOWEL NUTRITION STATUS      Continent Diet (heart healthy, thin liquids)  AMBULATORY STATUS COMMUNICATION OF NEEDS Skin   Independent Verbally Other (Comment) (open right toe, guaze dressing)                       Personal Care Assistance Level of Assistance  Bathing, Feeding, Dressing Bathing Assistance: Independent Feeding assistance: Independent Dressing Assistance: Independent     Functional Limitations Info  Sight, Hearing, Speech Sight Info: Adequate Hearing Info: Adequate Speech Info: Adequate    SPECIAL CARE FACTORS FREQUENCY  PT (By licensed PT), OT (By licensed  OT)     PT Frequency: 2x OT Frequency: 2x            Contractures Contractures Info: Not present    Additional Factors Info  Code Status, Allergies Code Status Info: full code Allergies Info: Hydrochlorothiazide           Current Medications (05/19/2022):  This is the current hospital active  medication list Current Facility-Administered Medications  Medication Dose Route Frequency Provider Last Rate Last Admin   acetaminophen (TYLENOL) tablet 650 mg  650 mg Oral Q6H PRN Athena Masse, MD   650 mg at 05/18/22 0844   Or   acetaminophen (TYLENOL) suppository 650 mg  650 mg Rectal Q6H PRN Athena Masse, MD       alum & mag hydroxide-simeth (MAALOX/MYLANTA) 200-200-20 MG/5ML suspension 30 mL  30 mL Oral Q4H PRN Jennye Boroughs, MD   30 mL at 05/12/22 1046   atenolol (TENORMIN) tablet 25 mg  25 mg Oral Daily Jennye Boroughs, MD   25 mg at 05/19/22 0859   atorvastatin (LIPITOR) tablet 10 mg  10 mg Oral Daily Jennye Boroughs, MD   10 mg at 05/19/22 0859   baclofen (LIORESAL) tablet 10 mg  10 mg Oral TID Jennye Boroughs, MD   10 mg at 05/19/22 0858   enoxaparin (LOVENOX) injection 40 mg  40 mg Subcutaneous Q24H Judd Gaudier V, MD   40 mg at 52/84/13 2440   folic acid (FOLVITE) tablet 1 mg  1 mg Oral Daily Athena Masse, MD   1 mg at 05/19/22 0859   multivitamin with minerals tablet 1 tablet  1 tablet Oral Daily Athena Masse, MD   1 tablet at 05/19/22 0858   ondansetron (ZOFRAN) tablet 4 mg  4 mg Oral Q6H PRN Athena Masse, MD       Or   ondansetron [facility]) injection 4 mg  4 mg Intravenous Q6H PRN Athena Masse, MD       polyethylene glycol (MIRALAX / GLYCOLAX) packet 17 g  17 g Oral Daily PRN Jennye Boroughs, MD   17 g at 05/18/22 0846   sodium chloride (OCEAN) 0.65 % nasal spray 1 spray  1 spray Each Nare PRN Fritzi Mandes, MD   1 spray at 05/16/22 1840   thiamine tablet 100 mg  100 mg Oral Daily Athena Masse, MD   100 mg at 05/19/22 1027     Discharge Medications: Please see discharge summary for a list of discharge medications.  Relevant Imaging Results:  Relevant Lab Results:   Additional Information SSN: [id number]  Eileen Stanford, LCSW

## 2022-05-19 NOTE — Plan of Care (Signed)
  Problem: Education: Goal: Knowledge of General Education information will improve Description: Including pain rating scale, medication(s)/side effects and non-pharmacologic comfort measures 05/19/2022 1131 by Gwendel Hanson, LPN Outcome: Adequate for Discharge 05/19/2022 0956 by Gwendel Hanson, LPN Outcome: Progressing   Problem: Health Behavior/Discharge Planning: Goal: Ability to manage health-related needs will improve 05/19/2022 1131 by Gwendel Hanson, LPN Outcome: Adequate for Discharge 05/19/2022 0956 by Gwendel Hanson, LPN Outcome: Progressing   Problem: Clinical Measurements: Goal: Ability to maintain clinical measurements within normal limits will improve 05/19/2022 1131 by Gwendel Hanson, LPN Outcome: Adequate for Discharge 05/19/2022 0956 by Gwendel Hanson, LPN Outcome: Progressing Goal: Will remain free from infection 05/19/2022 1131 by Gwendel Hanson, LPN Outcome: Adequate for Discharge 05/19/2022 0956 by Gwendel Hanson, LPN Outcome: Progressing Goal: Diagnostic test results will improve 05/19/2022 1131 by Gwendel Hanson, LPN Outcome: Adequate for Discharge 05/19/2022 0956 by Gwendel Hanson, LPN Outcome: Progressing Goal: Respiratory complications will improve 05/19/2022 1131 by Gwendel Hanson, LPN Outcome: Adequate for Discharge 05/19/2022 0956 by Gwendel Hanson, LPN Outcome: Progressing Goal: Cardiovascular complication will be avoided 05/19/2022 1131 by Gwendel Hanson, LPN Outcome: Adequate for Discharge 05/19/2022 0956 by Gwendel Hanson, LPN Outcome: Progressing   Problem: Activity: Goal: Risk for activity intolerance will decrease 05/19/2022 1131 by Gwendel Hanson, LPN Outcome: Adequate for Discharge 05/19/2022 0956 by Gwendel Hanson, LPN Outcome: Progressing   Problem: Nutrition: Goal: Adequate nutrition will be maintained 05/19/2022 1131 by Gwendel Hanson, LPN Outcome: Adequate for Discharge 05/19/2022 0956 by Gwendel Hanson, LPN Outcome: Progressing    Problem: Coping: Goal: Level of anxiety will decrease 05/19/2022 1131 by Gwendel Hanson, LPN Outcome: Adequate for Discharge 05/19/2022 0956 by Gwendel Hanson, LPN Outcome: Progressing   Problem: Elimination: Goal: Will not experience complications related to bowel motility 05/19/2022 1131 by Gwendel Hanson, LPN Outcome: Adequate for Discharge 05/19/2022 0956 by Gwendel Hanson, LPN Outcome: Progressing Goal: Will not experience complications related to urinary retention 05/19/2022 1131 by Gwendel Hanson, LPN Outcome: Adequate for Discharge 05/19/2022 0956 by Gwendel Hanson, LPN Outcome: Progressing   Problem: Pain Managment: Goal: General experience of comfort will improve 05/19/2022 1131 by Gwendel Hanson, LPN Outcome: Adequate for Discharge 05/19/2022 0956 by Gwendel Hanson, LPN Outcome: Progressing   Problem: Safety: Goal: Ability to remain free from injury will improve 05/19/2022 1131 by Gwendel Hanson, LPN Outcome: Adequate for Discharge 05/19/2022 0956 by Gwendel Hanson, LPN Outcome: Progressing   Problem: Skin Integrity: Goal: Risk for impaired skin integrity will decrease 05/19/2022 1131 by Gwendel Hanson, LPN Outcome: Adequate for Discharge 05/19/2022 0956 by Gwendel Hanson, LPN Outcome: Progressing
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
## 2022-05-19 NOTE — Progress Notes (Signed)
Mobility Specialist - Progress Note    05/19/22 1100  Mobility  Activity Ambulated with assistance in hallway;Stood at bedside;Dangled on edge of bed;Turned to left side;Turned to right side  Range of Motion/Exercises Active  Level of Assistance Contact guard assist, steadying assist  Assistive Device Cane  Distance Ambulated (ft) 340 ft  Activity Response Tolerated well  $Mobility charge 1 Mobility   Pt was supine in bed upon arrival on RA. Pt. Ambulated 340 around nurses station with CGA. Pt. Mentioned right lower extremities was sensitive but not in pain. Pt returned to room to bed and bathed with light assist, rolled to left and right for linen change. Pt was supine in bed with needs in reach with bed alarm on. Note written by MS Burley, supervised by MS .   Douglas Peterson Mobility Specialist 05/19/22, 11:20 AM

## 2022-05-20 ENCOUNTER — Observation Stay
Admission: EM | Admit: 2022-05-20 | Discharge: 2022-05-21 | Disposition: A | Discharge status: Home / Self Care | Payer: Medicare HMO | Attending: Obstetrics and Gynecology | Admitting: Obstetrics and Gynecology

## 2022-05-20 ENCOUNTER — Emergency Department: Payer: Medicare HMO

## 2022-05-20 ENCOUNTER — Encounter: Payer: Self-pay | Admitting: Medical Oncology

## 2022-05-20 DIAGNOSIS — R197 Diarrhea, unspecified: Secondary | ICD-10-CM

## 2022-05-20 DIAGNOSIS — I480 Paroxysmal atrial fibrillation: Secondary | ICD-10-CM | POA: Diagnosis present

## 2022-05-20 DIAGNOSIS — R1084 Generalized abdominal pain: Secondary | ICD-10-CM

## 2022-05-20 DIAGNOSIS — R109 Unspecified abdominal pain: Secondary | ICD-10-CM | POA: Insufficient documentation

## 2022-05-20 DIAGNOSIS — R2681 Unsteadiness on feet: Secondary | ICD-10-CM | POA: Insufficient documentation

## 2022-05-20 DIAGNOSIS — F419 Anxiety disorder, unspecified: Secondary | ICD-10-CM | POA: Diagnosis present

## 2022-05-20 DIAGNOSIS — R296 Repeated falls: Secondary | ICD-10-CM | POA: Insufficient documentation

## 2022-05-20 DIAGNOSIS — E871 Hypo-osmolality and hyponatremia: Principal | ICD-10-CM | POA: Diagnosis present

## 2022-05-20 DIAGNOSIS — I10 Essential (primary) hypertension: Secondary | ICD-10-CM | POA: Diagnosis present

## 2022-05-20 DIAGNOSIS — K7030 Alcoholic cirrhosis of liver without ascites: Secondary | ICD-10-CM | POA: Diagnosis not present

## 2022-05-20 DIAGNOSIS — Z87891 Personal history of nicotine dependence: Secondary | ICD-10-CM | POA: Insufficient documentation

## 2022-05-20 DIAGNOSIS — Z79899 Other long term (current) drug therapy: Secondary | ICD-10-CM | POA: Diagnosis not present

## 2022-05-20 DIAGNOSIS — R531 Weakness: Secondary | ICD-10-CM | POA: Diagnosis not present

## 2022-05-20 DIAGNOSIS — F10929 Alcohol use, unspecified with intoxication, unspecified: Secondary | ICD-10-CM | POA: Diagnosis present

## 2022-05-20 DIAGNOSIS — K219 Gastro-esophageal reflux disease without esophagitis: Secondary | ICD-10-CM | POA: Diagnosis present

## 2022-05-20 DIAGNOSIS — K7581 Nonalcoholic steatohepatitis (NASH): Secondary | ICD-10-CM | POA: Diagnosis present

## 2022-05-20 DIAGNOSIS — F5101 Primary insomnia: Secondary | ICD-10-CM | POA: Diagnosis present

## 2022-05-20 DIAGNOSIS — K760 Fatty (change of) liver, not elsewhere classified: Secondary | ICD-10-CM | POA: Diagnosis present

## 2022-05-20 DIAGNOSIS — F1010 Alcohol abuse, uncomplicated: Secondary | ICD-10-CM | POA: Diagnosis present

## 2022-05-20 LAB — URINALYSIS, ROUTINE W REFLEX MICROSCOPIC
Bilirubin Urine: NEGATIVE
Glucose, UA: NEGATIVE mg/dL
Hgb urine dipstick: NEGATIVE
Ketones, ur: NEGATIVE mg/dL
Leukocytes,Ua: NEGATIVE
Nitrite: NEGATIVE
Protein, ur: NEGATIVE mg/dL
Specific Gravity, Urine: 1.016 (ref 1.005–1.030)
pH: 7 (ref 5.0–8.0)

## 2022-05-20 LAB — MAGNESIUM: Magnesium: 2.1 mg/dL (ref 1.7–2.4)

## 2022-05-20 LAB — PHOSPHORUS: Phosphorus: 3.2 mg/dL (ref 2.5–4.6)

## 2022-05-20 LAB — BASIC METABOLIC PANEL
Anion gap: 5 (ref 5–15)
BUN: 8 mg/dL (ref 8–23)
CO2: 23 mmol/L (ref 22–32)
Calcium: 7.8 mg/dL — ABNORMAL LOW (ref 8.9–10.3)
Chloride: 98 mmol/L (ref 98–111)
Creatinine, Ser: 0.42 mg/dL — ABNORMAL LOW (ref 0.61–1.24)
GFR, Estimated: >60 mL/min (ref >60)
Glucose, Bld: 82 mg/dL (ref 70–99)
Potassium: 3.9 mmol/L (ref 3.5–5.1)
Sodium: 126 mmol/L — ABNORMAL LOW (ref 135–145)

## 2022-05-20 LAB — CBC
HCT: 40.9 % (ref 39.0–52.0)
Hemoglobin: 14 g/dL (ref 13.0–17.0)
MCH: 31.3 pg (ref 26.0–34.0)
MCHC: 34.2 g/dL (ref 30.0–36.0)
MCV: 91.3 fL (ref 80.0–100.0)
Platelets: 170 10*3/uL (ref 150–400)
RBC: 4.48 MIL/uL (ref 4.22–5.81)
RDW: 12.5 % (ref 11.5–15.5)
WBC: 8.6 10*3/uL (ref 4.0–10.5)
nRBC: 0 % (ref 0.0–0.2)

## 2022-05-20 LAB — COMPREHENSIVE METABOLIC PANEL
ALT: 61 U/L — ABNORMAL HIGH (ref 0–44)
AST: 38 U/L (ref 15–41)
Albumin: 4.6 g/dL (ref 3.5–5.0)
Alkaline Phosphatase: 82 U/L (ref 38–126)
Anion gap: 9 (ref 5–15)
BUN: 10 mg/dL (ref 8–23)
CO2: 26 mmol/L (ref 22–32)
Calcium: 8.8 mg/dL — ABNORMAL LOW (ref 8.9–10.3)
Chloride: 90 mmol/L — ABNORMAL LOW (ref 98–111)
Creatinine, Ser: 0.5 mg/dL — ABNORMAL LOW (ref 0.61–1.24)
GFR, Estimated: >60 mL/min (ref >60)
Glucose, Bld: 82 mg/dL (ref 70–99)
Potassium: 3.6 mmol/L (ref 3.5–5.1)
Sodium: 125 mmol/L — ABNORMAL LOW (ref 135–145)
Total Bilirubin: 0.7 mg/dL (ref 0.3–1.2)
Total Protein: 7.9 g/dL (ref 6.5–8.1)

## 2022-05-20 LAB — ETHANOL: Alcohol, Ethyl (B): 117 mg/dL — ABNORMAL HIGH (ref <10)

## 2022-05-20 LAB — LIPASE, BLOOD: Lipase: 32 U/L (ref 11–51)

## 2022-05-20 LAB — SODIUM: Sodium: 130 mmol/L — ABNORMAL LOW (ref 135–145)

## 2022-05-20 MED ORDER — ATORVASTATIN CALCIUM 20 MG PO TABS
10.0000 mg | ORAL_TABLET | Freq: Every day | ORAL | Status: DC
  Administered 2022-05-20 – 2022-05-21 (×2): 10 mg via ORAL
  Filled 2022-05-20 (×2): qty 1

## 2022-05-20 MED ORDER — POLYETHYLENE GLYCOL 3350 17 GM/SCOOP PO POWD
17.0000 g | Freq: Every day | ORAL | Status: DC | PRN
Start: 2022-05-20 — End: 2022-05-21

## 2022-05-20 MED ORDER — ENSURE ENLIVE PO LIQD
237.0000 mL | Freq: Two times a day (BID) | ORAL | Status: DC
  Administered 2022-05-21: 237 mL via ORAL

## 2022-05-20 MED ORDER — SODIUM CHLORIDE 0.9 % IV SOLN: INTRAVENOUS | Status: AC

## 2022-05-20 MED ORDER — THIAMINE HCL 100 MG PO TABS
100.0000 mg | ORAL_TABLET | Freq: Every day | ORAL | Status: DC
  Administered 2022-05-20 – 2022-05-21 (×2): 100 mg via ORAL
  Filled 2022-05-20 (×2): qty 1

## 2022-05-20 MED ORDER — ADULT MULTIVITAMIN W/MINERALS CH
1.0000 | ORAL_TABLET | Freq: Every day | ORAL | Status: DC
  Administered 2022-05-20 – 2022-05-21 (×2): 1 via ORAL
  Filled 2022-05-20 (×2): qty 1

## 2022-05-20 MED ORDER — LORAZEPAM 2 MG/ML IJ SOLN
2.0000 mg | INTRAMUSCULAR | Status: DC | PRN
  Administered 2022-05-20: 2 mg via INTRAVENOUS
  Filled 2022-05-20: qty 1

## 2022-05-20 MED ORDER — ENOXAPARIN SODIUM 40 MG/0.4ML IJ SOSY: 40.0000 mg | PREFILLED_SYRINGE | Freq: Every day | INTRAMUSCULAR | Status: DC

## 2022-05-20 MED ORDER — ONDANSETRON HCL 4 MG PO TABS: 4.0000 mg | ORAL_TABLET | Freq: Four times a day (QID) | ORAL | Status: DC | PRN

## 2022-05-20 MED ORDER — ACETAMINOPHEN 650 MG RE SUPP
650.0000 mg | Freq: Four times a day (QID) | RECTAL | Status: DC | PRN
Start: 2022-05-20 — End: 2022-05-21

## 2022-05-20 MED ORDER — HYDRALAZINE HCL 20 MG/ML IJ SOLN
5.0000 mg | Freq: Four times a day (QID) | INTRAMUSCULAR | Status: DC | PRN
Start: 2022-05-20 — End: 2022-05-21

## 2022-05-20 MED ORDER — SODIUM CHLORIDE 0.9 % IV BOLUS
500.0000 mL | Freq: Once | INTRAVENOUS | Status: AC
  Administered 2022-05-20: 500 mL via INTRAVENOUS

## 2022-05-20 MED ORDER — TIZANIDINE HCL 4 MG PO TABS
8.0000 mg | ORAL_TABLET | Freq: Three times a day (TID) | ORAL | Status: DC | PRN
  Administered 2022-05-21: 8 mg via ORAL
  Filled 2022-05-20 (×2): qty 2

## 2022-05-20 MED ORDER — VITAMIN B-12 1000 MCG PO TABS
1000.0000 ug | ORAL_TABLET | Freq: Every day | ORAL | Status: DC
Start: 2022-05-20 — End: 2022-05-21
  Administered 2022-05-20 – 2022-05-21 (×2): 1000 ug via ORAL
  Filled 2022-05-20 (×3): qty 1

## 2022-05-20 MED ORDER — SODIUM CHLORIDE 0.9 % IV BOLUS
1000.0000 mL | Freq: Once | INTRAVENOUS | Status: AC
  Administered 2022-05-20: 1000 mL via INTRAVENOUS

## 2022-05-20 MED ORDER — PANTOPRAZOLE SODIUM 40 MG PO TBEC
40.0000 mg | DELAYED_RELEASE_TABLET | Freq: Two times a day (BID) | ORAL | Status: DC
  Administered 2022-05-20 – 2022-05-21 (×2): 40 mg via ORAL
  Filled 2022-05-20 (×2): qty 1

## 2022-05-20 MED ORDER — ACETAMINOPHEN 325 MG PO TABS: 650.0000 mg | ORAL_TABLET | Freq: Four times a day (QID) | ORAL | Status: DC | PRN

## 2022-05-20 MED ORDER — FOLIC ACID 1 MG PO TABS
1.0000 mg | ORAL_TABLET | Freq: Every day | ORAL | Status: DC
  Administered 2022-05-20 – 2022-05-21 (×2): 1 mg via ORAL
  Filled 2022-05-20 (×2): qty 1

## 2022-05-20 MED ORDER — ONDANSETRON HCL 4 MG/2ML IJ SOLN: 4.0000 mg | Freq: Four times a day (QID) | INTRAMUSCULAR | Status: DC | PRN

## 2022-05-20 MED ORDER — SENNOSIDES-DOCUSATE SODIUM 8.6-50 MG PO TABS: 2.0000 | ORAL_TABLET | Freq: Every evening | ORAL | Status: DC | PRN

## 2022-05-20 MED ORDER — IOHEXOL 300 MG/ML  SOLN
80.0000 mL | Freq: Once | INTRAMUSCULAR | Status: AC | PRN
  Administered 2022-05-20: 80 mL via INTRAVENOUS

## 2022-05-20 NOTE — ED Notes (Signed)
Pt states he will try to take his PO meds after he eats dinner.

## 2022-05-20 NOTE — Assessment & Plan Note (Signed)
-   Secondary to excessive alcohol use - Patient states he drinks 8-12 beers per day depending on the day; they are 12 ounces each - Status post 1.5 mL of sodium chloride ordered - BMP rechecked at 2000 on day of admission and again at 5 AM on 05/21/2022 - Anticipate discharge tomorrow once sodium corrects

## 2022-05-20 NOTE — Assessment & Plan Note (Signed)
-   Patient currently normotensive - Hydralazine 5 mg IV every 6 hours as needed for SBP greater than 180, 2 days ordered

## 2022-05-20 NOTE — Assessment & Plan Note (Addendum)
-   Ethanol level was elevated at 117 on admission - Patient was found surrounded by beer cans and he states that he was drinking - I do not believe he will withdrawal at this time - Ativan 2 mg IV every 4 hours as needed for seizure and anxiety ordered, 4 doses

## 2022-05-20 NOTE — Hospital Course (Addendum)
[name] is a 71 year old male with history of alcohol abuse, hyponatremia secondary to chronic beer drinking, mild to severe malnutrition secondary to alcohol abuse, presents emergency department for chief concerns of diarrhea and abdominal discomfort.  Initial vitals in the emergency department showed temperature of 98.1, respiration rate of 17, heart rate 72, blood pressure 142/88, SPO2 100% on room air.  Serum sodium was 125, potassium 3.6, chloride 90, bicarb 26, BUN of 10, serum creatinine 0.50, GFR greater than 60, nonfasting blood glucose 82, WBC 8.6, hemoglobin 14, platelets of 170.  Repeat BMP after sodium chloride 1.5 mL administration showed serum sodium of 126.  ED treatment: Sodium chloride 1.5 mL

## 2022-05-20 NOTE — Assessment & Plan Note (Signed)
-   Presumed secondary to malnutrition in setting of excessive alcohol use - Resumed home multivitamins, folic acid, thiamine

## 2022-05-20 NOTE — ED Notes (Addendum)
Sam RN and this RN attempted for IV with no success. IV team order placed at this time. IV fluids paused at this time till new IV can be established.

## 2022-05-20 NOTE — ED Notes (Addendum)
CT called at this time that patient is ready for scan. IV fluids resumed.

## 2022-05-20 NOTE — Assessment & Plan Note (Signed)
-   Ativan 2 mg IV every 4 hours as needed for seizure and anxiety, 4 doses ordered
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
## 2022-05-20 NOTE — Assessment & Plan Note (Signed)
-   Protonix 40 mg p.o. twice daily ordered

## 2022-05-20 NOTE — ED Triage Notes (Signed)
Pt to ED via [facility] with reports that pt is having lower abd pain and has been having diarrhea. Per EMS beer cans all over the house.

## 2022-05-20 NOTE — ED Provider Notes (Signed)
[facility] Provider Note    Event Date/Time   First MD Initiated Contact with Patient 05/20/22 [phone]     (approximate)   History   Abdominal Pain and Diarrhea   HPI  Pratt Bress. is a 71 y.o. male with alcoholic cirrhosis who continues to drink.  Patient called EMS because he has been having abdominal pain and frequent diarrhea several times a day for about a week he said.  He feels very weak and tired.  EMS reported beer cans all over the house.  Patient does not think he has been running a fever.  Did not see any blood in the diarrhea.  He is not vomiting but is has a history of having reflux of stomach material up into his mouth several times a day every day which has been ongoing.      Physical Exam   Triage Vital Signs: ED Triage Vitals  Enc Vitals Group     BP 05/20/22 0732 (!) 142/88     Pulse Rate 05/20/22 0732 72     Resp 05/20/22 0732 17     Temp 05/20/22 0732 98.1 F (36.7 C)     Temp Source 05/20/22 0732 Oral     SpO2 05/20/22 0732 100 %     Weight 05/20/22 0733 132 lb 4.4 oz (60 kg)     Height 05/20/22 0733 5' 3"  (1.6 m)     Head Circumference --      Peak Flow --      Pain Score 05/20/22 0732 8     Pain Loc --      Pain Edu? --      Excl. in [place]? --     Most recent vital signs: Vitals:   05/20/22 0951 05/20/22 1200  BP: (!) 150/81 (!) 147/72  Pulse: 69 74  Resp: 18 17  Temp:  98.4 F (36.9 C)  SpO2: 97% 94%     General: Awake, no distress.  CV:  Good peripheral perfusion.  Heart regular rate and rhythm no audible murmurs Resp:  Normal effort.  Lungs occasional crackle scattered throughout Abd:  No distention.  Soft mildly diffusely tender Extremities slight trace edema   ED Results / Procedures / Treatments   Labs (all labs ordered are listed, but only abnormal results are displayed) Labs Reviewed  COMPREHENSIVE METABOLIC PANEL - Abnormal; Notable for the following components:      Result Value   Sodium  125 (*)    Chloride 90 (*)    Creatinine, Ser 0.50 (*)    Calcium 8.8 (*)    ALT 61 (*)    All other components within normal limits  ETHANOL - Abnormal; Notable for the following components:   Alcohol, Ethyl (B) 117 (*)    All other components within normal limits  C DIFFICILE QUICK SCREEN W PCR REFLEX    GASTROINTESTINAL PANEL BY PCR, STOOL (REPLACES STOOL CULTURE)  LIPASE, BLOOD  CBC  URINALYSIS, ROUTINE W REFLEX MICROSCOPIC     EKG  EKG read and interpreted by me shows normal sinus rhythm rate of 78 normal axis no acute ST-T wave changes irregular baseline   RADIOLOGY Chest x-ray reviewed by me interpreted by me does not show any acute disease that I can see away for the radiologist reading to confirm.   PROCEDURES:  Critical Care performed: Critical care time 20 minutes.  This includes evaluating the patient checking on the studies and labs and EKG.  In addition  I am speaking with the hospitalist.  Procedures   MEDICATIONS ORDERED IN ED: Medications  sodium chloride 0.9 % bolus 1,000 mL (1,000 mLs Intravenous New Bag/Given 05/20/22 0930)  iohexol (OMNIPAQUE) 300 MG/ML solution 80 mL (80 mLs Intravenous Contrast Given 05/20/22 1105)     IMPRESSION / MDM / ASSESSMENT AND PLAN / ED COURSE  I reviewed the triage vital signs and the nursing notes. Patient weak not feeling well likely due to his hyponatremia which is likely due to his alcohol abuse.  He has been almost this low before but not quite.  Belly CT does not show any pathology except for a not incarcerated hernia. There does not appear to be any other problem.  Patient's presentation is most consistent with acute presentation with potential threat to life or bodily function.  The patient is on the cardiac monitor to evaluate for evidence of arrhythmia and/or significant heart rate changes.  None have been seen      FINAL CLINICAL IMPRESSION(S) / ED DIAGNOSES   Final diagnoses:  Diarrhea, unspecified type   Generalized abdominal pain  Hyponatremia     Rx / DC Orders   ED Discharge Orders     None        Note:  This document was prepared using Dragon voice recognition software and may include unintentional dictation errors.   Nena Polio, MD 05/20/22 1217

## 2022-05-20 NOTE — ED Notes (Signed)
Pt provided a sandwich tray per MD

## 2022-05-20 NOTE — ED Notes (Signed)
Pt meal tray set up. Lemon added to tea seasoning placed over rice and chicken cut into small pieces.

## 2022-05-21 ENCOUNTER — Other Ambulatory Visit: Payer: Self-pay

## 2022-05-21 DIAGNOSIS — E871 Hypo-osmolality and hyponatremia: Secondary | ICD-10-CM | POA: Diagnosis not present

## 2022-05-21 LAB — CBC
HCT: 41.7 % (ref 39.0–52.0)
Hemoglobin: 14 g/dL (ref 13.0–17.0)
MCH: 31.6 pg (ref 26.0–34.0)
MCHC: 33.6 g/dL (ref 30.0–36.0)
MCV: 94.1 fL (ref 80.0–100.0)
Platelets: 150 10*3/uL (ref 150–400)
RBC: 4.43 MIL/uL (ref 4.22–5.81)
RDW: 12.5 % (ref 11.5–15.5)
WBC: 5.4 10*3/uL (ref 4.0–10.5)
nRBC: 0 % (ref 0.0–0.2)

## 2022-05-21 LAB — BASIC METABOLIC PANEL
Anion gap: 4 — ABNORMAL LOW (ref 5–15)
BUN: 10 mg/dL (ref 8–23)
CO2: 26 mmol/L (ref 22–32)
Calcium: 9.1 mg/dL (ref 8.9–10.3)
Chloride: 104 mmol/L (ref 98–111)
Creatinine, Ser: 0.51 mg/dL — ABNORMAL LOW (ref 0.61–1.24)
GFR, Estimated: >60 mL/min (ref >60)
Glucose, Bld: 88 mg/dL (ref 70–99)
Potassium: 4.5 mmol/L (ref 3.5–5.1)
Sodium: 134 mmol/L — ABNORMAL LOW (ref 135–145)

## 2022-05-21 NOTE — TOC Initial Note (Signed)
Transition of Care (TOC) - Initial/Assessment Note    Patient Details  Name: Douglas Peterson. MRN: [id number] Date of Birth: 1951/10/18  Transition of Care [facility]) CM/SW Contact:    Beverly Sessions, RN Phone Number: 05/21/2022, 1:17 PM  Clinical Narrative:                 Patient to discharge today Per Barrie Lyme with [facility] and [facility] are able to offer patient a bed if he will accept.   Discussed with patient.  He declines and said he would be going home.  Provided patient with shoes from the charity clothing closet.   Patient ambulating with PT, and per PT patient appropriate to transport via private vehicle. Cab voucher on chart, and Bedside RN to call when ready  Per Malachy Mood with [facility] patient is set up with Alvester Chou NP next week and then they can open patient for home health. Patient in observation status and she states she will not need new orders   Leanora Ivanoff with [facility] updated via secure email         Patient Goals and CMS Choice        Expected Discharge Plan and Services           Expected Discharge Date: 05/21/22                                    Prior Living Arrangements/Services                       Activities of Daily Living Home Assistive Devices/Equipment: Kasandra Knudsen (specify quad or straight) ADL Screening (condition at time of admission) Patient's cognitive ability adequate to safely complete daily activities?: Yes Is the patient deaf or have difficulty hearing?: No Does the patient have difficulty seeing, even when wearing glasses/contacts?: No Does the patient have difficulty concentrating, remembering, or making decisions?: No Patient able to express need for assistance with ADLs?: Yes Does the patient have difficulty dressing or bathing?: Yes Independently performs ADLs?: No Communication: Independent Dressing (OT): Independent Grooming: Independent Feeding: Independent Bathing: Needs assistance Is this a change  from baseline?: Change from baseline, expected to last <3 days Toileting: Independent In/Out Bed: Needs assistance Is this a change from baseline?: Change from baseline, expected to last <3 days Walks in Home: Independent Does the patient have difficulty walking or climbing stairs?: Yes Weakness of Legs: Right Weakness of Arms/Hands: Right  Permission Sought/Granted                  Emotional Assessment              Admission diagnosis:  Hyponatremia [E87.1] Generalized abdominal pain [R10.84] Diarrhea, unspecified type [R19.7] Patient Active Problem List   Diagnosis Date Noted   Maggot infestation feet 05/11/2022   Unable to care for self 05/11/2022   Debility 04/25/2022   Weakness 04/23/2022   Epistaxis 04/23/2022   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   Generalized weakness 03/18/2022   Nondisplaced fracture of shaft of left clavicle, initial encounter for closed fracture 03/18/2022   BPH (benign prostatic hyperplasia) 03/18/2022   Clavicle fracture 03/17/2022   Fall 03/17/2022   CAP (community acquired pneumonia) 03/13/2022   Altered mental status, unspecified 02/20/2022   Hypotension 02/19/2022   Dysphonia 08/13/2021   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   Paresthesia of right upper extremity 08/13/2021  SIRS (systemic inflammatory response syndrome) (HCC) 08/05/2020   GERD (gastroesophageal reflux disease) 06/05/2020   Alcohol use disorder, severe, dependence ([place]) 05/28/2020   Paroxysmal A-fib ([place]) 03/17/2020   Primary insomnia 02/06/2020   Left inguinal hernia 01/31/2020   Encounter for competency evaluation    Depression 01/17/2020   Pancytopenia ([place])    Hypomagnesemia    Alcoholic cirrhosis of liver without ascites ([place])    ETOH abuse    Alcohol intoxication ([place])    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[place]) 09/27/2019   Alcohol withdrawal delirium, acute, hyperactive ([place]) 09/21/2019   Pressure injury of ankle, stage  1 08/15/2019   Palliative care by specialist    Closed fracture of right proximal humerus 03/19/2019   Vitamin D deficiency 01/11/2019   Osteoporosis 12/22/2018   Closed nondisplaced fracture of acromial process of right scapula with routine healing 11/15/2018   Compression fracture of L2 vertebra with routine healing 11/15/2018   Delirium tremens ([place]) 03/08/2018   HTN (hypertension) 09/16/2017   Encephalopathy, 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   Steatohepatitis 12/03/2016   Abnormal liver enzymes 06/17/2016   Hereditary hemochromatosis ([place]) 06/17/2016   Anxiety 07/16/2015   Hyponatremia 07/09/2015   H/O traumatic brain injury 05/22/2014   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[place]) 05/22/2014   PCP:  Merryl Hacker No Pharmacy:   [facility] [address], [place] - [place] [address] Phone: [phone] Fax: [phone]     Social Determinants of Health (SDOH) Interventions    Readmission Risk Interventions    04/29/2022   10:26 AM 03/14/2022   12:48 PM 08/23/2020   11:52 AM  Readmission Risk Prevention Plan  Transportation Screening Complete Complete Complete  PCP or Specialist Appt within 3-5 Days  Complete   HRI or [place] Complete Complete   Social Work Consult for [place] Planning/Counseling Complete Complete   Palliative Care Screening Not Applicable Not Applicable   Medication Review Press photographer) Complete Complete Complete  HRI or [place]   Not Applicable

## 2022-05-21 NOTE — Progress Notes (Signed)
Mobility Specialist - Progress Note    05/21/22 1130  Mobility  Activity Ambulated with assistance in hallway;Stood at bedside;Dangled on edge of bed  Level of Assistance Minimal assist, patient does 75% or more  Assistive Device Cane  Distance Ambulated (ft) 20 ft  Activity Response Tolerated well  $Mobility charge 1 Mobility    Post-mobility: 117 HR,145/100 BP, 97% SPO2  Pt sitting in chair upon arrival using RA, RN present. Pt donned shoes in figure 4 position ModI + extra time. Pt completes STS and ambulates MinA with SPC --- voices nausea and lightheadedness during gait. Returns to recliner and BP is taken 145/100, RN notified. Pt voices feeling anxious about upcoming discharge. Pt is left in recliner with needs in reach and chair alarm set.  Merrily Brittle Mobility Specialist 05/21/22, 11:34 AM

## 2022-05-21 NOTE — Progress Notes (Signed)
Pt requesting HH. Discussed with case Freight forwarder. HH will follow after PCP follow up. Discussed with pt. IV removed, pt prepared for d/c. Taxi, [employer], services requested with taxi voucher. And states will be here between 2:45 and 3:00 pm.

## 2022-05-21 NOTE — Discharge Summary (Signed)
Douglas Peterson. LNL:[id number] DOB: October 31, 1951 DOA: 05/20/2022  PCP: Pcp, No  Admit date: 05/20/2022 Discharge date: 05/21/2022  Time spent: 40 minutes  Recommendations for Outpatient Follow-up:  Pcp f/u, alcohol abstinence     Discharge Diagnoses:  Principal Problem:   Hyponatremia Active Problems:   Alcohol intoxication ([place])   Paroxysmal A-fib ([place])   HTN (hypertension)   Weakness   ETOH abuse   Alcoholic cirrhosis of liver without ascites (HCC)   GERD (gastroesophageal reflux disease)   Anxiety   Primary insomnia   Steatohepatitis   Discharge Condition: stable  Diet recommendation: heart healthy  Filed Weights   05/20/22 0733 05/20/22 1937  Weight: 60 kg 59 kg    History of present illness:  From admission h and p Douglas Peterson is a 71 year old male with history of alcohol abuse, hyponatremia secondary to chronic beer drinking, mild to severe malnutrition secondary to alcohol abuse, presents emergency department for chief concerns of diarrhea and abdominal discomfort.   Initial vitals in the emergency department showed temperature of 98.1, respiration rate of 17, heart rate 72, blood pressure 142/88, SPO2 100% on room air.  Serum sodium was 125, potassium 3.6, chloride 90, bicarb 26, BUN of 10, serum creatinine 0.50, GFR greater than 60, nonfasting blood glucose 82, WBC 8.6, hemoglobin 14, platelets of 170.  Repeat BMP after sodium chloride 1.5 mL administration showed serum sodium of 126.  ED treatment: Sodium chloride 1.5 mL   At bedside patient was able to tell me his full name, his age, he is in the hospital and he was able to tell me the current calendar year.  He reports that he has been feeling weak this morning.  He states that he has been drinking last night and this morning.  He denies chest pain, shortness of breath, trauma to his person, dysuria, hematuria.   Specifically at bedside he denies abdominal pain and diarrhea.   Initially with ED  department, he endorses diarrhea however he has been in the hospital for approximately 8 hours and there has been no bowel movements.  Hospital Course:  Patient presented complaining of weakness and diarrhea. No diarrhea or other BM here. Was found to be hyponatremic which resolved with food and fluids. Evaluated by PT. Social work evaluated, patient has bed offers at [facility] and [facility] but patient declined. [name] involved with APS and they are pursuing guardianship but social work confirmed currently no guardian, patient is his own Media planner, and he wants to return home. No signs of withdrawal here. Remains at high risk for readmission.   Procedures: none   Consultations: none  Discharge Exam: Vitals:   05/21/22 0357 05/21/22 0746  BP: (!) 150/77 (!) 152/72  Pulse: 61 74  Resp: 18 18  Temp: 97.6 F (36.4 C) 98.1 F (36.7 C)  SpO2: 99% 100%    General: NAD, chronically ill appearing Cardiovascular: rrr Respiratory: ctab Ext: warm  Discharge Instructions   Discharge Instructions     Change dressing (specify)   Complete by: As directed    Dressing change: once daily   Diet - low sodium heart healthy   Complete by: As directed    Increase activity slowly   Complete by: As directed       Allergies as of 05/21/2022       Reactions   Hydrochlorothiazide Other (See Comments)   Hyponatremia        Medication List     TAKE these medications  acetaminophen 500 MG tablet Commonly known as: TYLENOL Take 2 tablets (1,000 mg total) by mouth every 8 (eight) hours as needed for mild pain or moderate pain.   atenolol 25 MG tablet Commonly known as: TENORMIN Take 1 tablet (25 mg total) by mouth daily.   atorvastatin 10 MG tablet Commonly known as: LIPITOR Take 1 tablet (10 mg total) by mouth daily.   feeding supplement Liqd Take 237 mLs by mouth 2 (two) times daily between meals.   folic acid 1 MG tablet Commonly known as: FOLVITE Take 1  tablet (1 mg total) by mouth daily.   multivitamin with minerals Tabs tablet Take 1 tablet by mouth daily.   ondansetron 4 MG tablet Commonly known as: ZOFRAN Take 4 mg by mouth every 8 (eight) hours as needed.   polyethylene glycol powder 17 GM/SCOOP powder Commonly known as: GLYCOLAX/MIRALAX Take 17 g by mouth daily as needed.   senna-docusate 8.6-50 MG tablet Commonly known as: Senokot-S Take 2 tablets by mouth at bedtime as needed.   thiamine 100 MG tablet Take 1 tablet (100 mg total) by mouth daily.   tiZANidine 4 MG tablet Commonly known as: ZANAFLEX Take 2 tablets (8 mg total) by mouth every 8 (eight) hours as needed for muscle spasms.               Discharge Care Instructions  (From admission, onward)           Start     Ordered   05/21/22 0000  Change dressing (specify)       Comments: Dressing change: once daily   05/21/22 1213           Allergies  Allergen Reactions   Hydrochlorothiazide Other (See Comments)    Hyponatremia      The results of significant diagnostics from this hospitalization (including imaging, microbiology, ancillary and laboratory) are listed below for reference.    Significant Diagnostic Studies: CT ABDOMEN PELVIS W CONTRAST  Result Date: 05/20/2022 CLINICAL DATA:  Lower abdominal pain and diarrhea. EXAM: CT ABDOMEN AND PELVIS WITH CONTRAST TECHNIQUE: Multidetector CT imaging of the abdomen and pelvis was performed using the standard protocol following bolus administration of intravenous contrast. RADIATION DOSE REDUCTION: This exam was performed according to the departmental dose-optimization program which includes automated exposure control, adjustment of the mA and/or kV according to patient size and/or use of iterative reconstruction technique. CONTRAST:  79m OMNIPAQUE IOHEXOL 300 MG/ML  SOLN COMPARISON:  03/13/2022 FINDINGS: Lower chest: The lung bases are clear of acute process. Elevation of the right hemidiaphragm  with some overlying vascular crowding and atelectasis. No pleural effusion or pulmonary lesions. The heart is normal in size. No pericardial effusion. The distal esophagus and aorta are unremarkable. Hepatobiliary: Diffuse fatty infiltration of the liver but no hepatic lesions or intrahepatic biliary dilatation. Gallbladder is unremarkable. No common bile duct dilatation. Pancreas: No mass, inflammation or ductal dilatation. Spleen: Normal size.  No focal lesions. Adrenals/Urinary Tract: Adrenal glands and kidneys are unremarkable. No renal lesions or hydronephrosis. The bladder is unremarkable. Stomach/Bowel: The stomach, duodenum, small bowel and colon are unremarkable. No acute inflammatory changes, mass lesions or obstructive findings. The terminal ileum is normal. The appendix is surgically absent. Vascular/Lymphatic: The aorta is normal in caliber. No dissection. The branch vessels are patent. The major venous structures are patent. No mesenteric or retroperitoneal mass or adenopathy. Small scattered lymph nodes are noted. Reproductive: The prostate gland and seminal vesicles are unremarkable. Other: There is a stable large  left inguinal hernia containing part of the sigmoid colon but no findings to suggest incarceration or obstruction. There is also a small right inguinal hernia containing fat. Gas noted in the anterior abdominal subcutaneous fat likely related to injections. Musculoskeletal: No significant bony findings. Remote thoracic and lumbar compression fractures with vertebral augmentation changes at L2. IMPRESSION: 1. No acute abdominal/pelvic findings, mass lesions or adenopathy. 2. Stable large left inguinal hernia containing part of the sigmoid colon but no findings to suggest incarceration or obstruction. 3. Small right inguinal hernia containing fat. 4. Diffuse fatty infiltration of the liver. Electronically Signed   By: Marijo Sanes M.D.   On: 05/20/2022 11:25   DG Chest 2 View  Result  Date: 05/20/2022 CLINICAL DATA:  Tachypnea. EXAM: CHEST - 2 VIEW COMPARISON:  05/11/2022 FINDINGS: The cardiac silhouette, mediastinal and hilar contours are within limits and stable. Low lung volumes with mild vascular crowding and streaky basilar atelectasis but no infiltrates or effusions. Remote thoracic trauma with bilateral clavicle fractures rib fractures. IMPRESSION: Low lung volumes with vascular crowding and streaky basilar atelectasis. No infiltrates or effusions. Electronically Signed   By: Marijo Sanes M.D.   On: 05/20/2022 09:55   DG Foot 2 Views Right  Result Date: 05/11/2022 CLINICAL DATA:  Maggots under big toe EXAM: RIGHT FOOT - 2 VIEW COMPARISON:  None Available. FINDINGS: No fracture or malalignment. Degenerative changes at the first IP joint and MTP joint. No erosions or osseous destructive change. No soft tissue emphysema. IMPRESSION: No acute osseous abnormality. Degenerative changes of the first digit Electronically Signed   By: Donavan Foil M.D.   On: 05/11/2022 00:37   DG Chest Portable 1 View  Result Date: 05/11/2022 CLINICAL DATA:  Intoxicated EXAM: PORTABLE CHEST 1 VIEW COMPARISON:  04/23/2022, CT 03/13/2022 FINDINGS: Hypoventilatory changes. Streaky atelectasis left base. No consolidation or effusion. Stable cardiomediastinal silhouette IMPRESSION: Low lung volumes with left basilar atelectasis Electronically Signed   By: Donavan Foil M.D.   On: 05/11/2022 00:36   DG Chest 2 View  Result Date: 04/23/2022 CLINICAL DATA:  Weakness.  Epistaxis. EXAM: CHEST - 2 VIEW COMPARISON:  Chest radiograph and CTA 03/13/2022 FINDINGS: The patient is rotated to the right with grossly unchanged cardiomediastinal silhouette. Lung volumes are low with unchanged elevation of the right hemidiaphragm and associated right basilar atelectasis. The left lung is clear. No edema, pleural effusion, or pneumothorax is identified. Chronic T3 and T8 compression fractures and chronic rib fractures are  again noted. IMPRESSION: Low lung volumes with unchanged right hemidiaphragm elevation and right basilar atelectasis. Electronically Signed   By: Logan Bores M.D.   On: 04/23/2022 12:06    Microbiology: No results found for this or any previous visit (from the past 240 hour(s)).   Labs: Basic Metabolic Panel: Recent Labs  Lab 05/18/22 0501 05/20/22 0735 05/20/22 1540 05/20/22 1957 05/21/22 0528  NA  --  125* 126* 130* 134*  K  --  3.6 3.9  --  4.5  CL  --  90* 98  --  104  CO2  --  26 23  --  26  GLUCOSE  --  82 82  --  88  BUN  --  10 8  --  10  CREATININE 0.51* 0.50* 0.42*  --  0.51*  CALCIUM  --  8.8* 7.8*  --  9.1  MG  --   --   --  2.1  --   PHOS  --   --   --  3.2  --    Liver Function Tests: Recent Labs  Lab 05/20/22 0735  AST 38  ALT 61*  ALKPHOS 82  BILITOT 0.7  PROT 7.9  ALBUMIN 4.6   Recent Labs  Lab 05/20/22 0735  LIPASE 32   No results for input(s): "AMMONIA" in the last 168 hours. CBC: Recent Labs  Lab 05/20/22 0735 05/21/22 0528  WBC 8.6 5.4  HGB 14.0 14.0  HCT 40.9 41.7  MCV 91.3 94.1  PLT 170 150   Cardiac Enzymes: No results for input(s): "CKTOTAL", "CKMB", "CKMBINDEX", "TROPONINI" in the last 168 hours. BNP: BNP (last 3 results) No results for input(s): "BNP" in the last 8760 hours.  ProBNP (last 3 results) No results for input(s): "PROBNP" in the last 8760 hours.  CBG: No results for input(s): "GLUCAP" in the last 168 hours.     Signed:  Desma Maxim MD.  [facility] 05/21/2022, 12:14 PM

## 2022-05-21 NOTE — Evaluation (Signed)
Physical Therapy Evaluation Patient Details Name: Douglas Peterson. MRN: [id number] DOB: September 12, 1951 Today's Date: 05/21/2022  History of Present Illness  Douglas Peterson. is a 3yoM that presented to ED for etoh abuse, hyponatremia and diarrhea. PMH: ETOH abuse with frequent aqdmissions in 2022 & 2021, AF, HTN, remote brain injury with chronic spastic hemiplegia. Recent multiple hospital admissions.   Clinical Impression  Patient alert, agreeable to PT. Reported he is from home, and frustrated that the cleaners "never showed up", pt was home for less than 1 day. At baseline he is ambulatory with a SPC.  He was able to perform bed mobility modI. Sit <> stand from EOB with SPC and minA initially, due to lack of shoes. He ambulated in the room ~44f total with CGA-minA, challenged by balance and HR elevated to 130s. Pt endorsed that medication also made him a little more restricted today as well. Overall the patient needed a bit more assistance today with mobility (did progress to standing and walking with CGA), but anticipate with adequate supportive footwear pt would be more independent. Recommendation at this time is HHPT with intermittent supervision/assistance.        Recommendations for follow up therapy are one component of a multi-disciplinary discharge planning process, led by the attending physician.  Recommendations may be updated based on patient status, additional functional criteria and insurance authorization.  Follow Up Recommendations Home health PT Can patient physically be transported by private vehicle: Yes    Assistance Recommended at Discharge Intermittent Supervision/Assistance  Patient can return home with the following  A little help with walking and/or transfers;A little help with bathing/dressing/bathroom;Assist for transportation;Help with stairs or ramp for entrance    Equipment Recommendations None recommended by PT  Recommendations for Other Services        Functional Status Assessment Patient has had a recent decline in their functional status and demonstrates the ability to make significant improvements in function in a reasonable and predictable amount of time.     Precautions / Restrictions Precautions Precautions: Fall Type of Shoulder Precautions: hx of L clavicle fx 03/18/22      Mobility  Bed Mobility Overal bed mobility: Modified Independent                  Transfers Overall transfer level: Needs assistance Equipment used: Straight cane Transfers: Sit to/from Stand Sit to Stand: Min guard, Min assist           General transfer comment: min assist for initial sit/stand (cuing/assist for anterior weight translation and initial balance)    Ambulation/Gait Ambulation/Gait assistance: Min guard, Min assist Gait Distance (Feet): 40 Feet Assistive device: Straight cane Gait Pattern/deviations: Step-through pattern, Decreased stance time - right, Trunk flexed, Decreased step length - left       General Gait Details: occasional imbalances with limited ability to self correct  Stairs            Wheelchair Mobility    Modified Rankin (Stroke Patients Only)       Balance Overall balance assessment: Needs assistance Sitting-balance support: No upper extremity supported, Feet supported Sitting balance-Leahy Scale: Good Sitting balance - Comments: no apparent LOB Postural control: Right lateral lean, Posterior lean Standing balance support: Single extremity supported Standing balance-Leahy Scale: Fair Standing balance comment: Improved steadiness in standing and ambulation using SPC rather than HHA  Pertinent Vitals/Pain Pain Assessment Pain Assessment: No/denies pain    Home Living Family/patient expects to be discharged to:: Private residence Living Arrangements: Alone Available Help at Discharge: Available PRN/intermittently;Family Type of Home:  House Home Access: Stairs to enter Entrance Stairs-Rails: Can reach both Entrance Stairs-Number of Steps: 2   Home Layout: One level Home Equipment: Conservation officer, nature (2 wheels);Cane - quad;BSC/3in1;Shower seat;Cane - single point      Prior Function Prior Level of Function : Independent/Modified Independent             Mobility Comments: Mod indep for household, limited community mobilization with SPC; occasional gait "without my cane" inside the home ADLs Comments: Mod indep with ADLs; endorses grocery delivery service     Hand Dominance   Dominant Hand: Left    Extremity/Trunk Assessment                Communication   Communication: No difficulties  Cognition Arousal/Alertness: Awake/alert Behavior During Therapy: WFL for tasks assessed/performed Overall Cognitive Status: Within Functional Limits for tasks assessed                                          General Comments      Exercises     Assessment/Plan    PT Assessment Patient needs continued PT services  PT Problem List Decreased safety awareness;Decreased mobility;Decreased strength;Decreased range of motion;Decreased activity tolerance;Decreased cognition;Decreased balance       PT Treatment Interventions DME instruction;Therapeutic exercise;Gait training;Balance training;Stair training;Functional mobility training;Therapeutic activities;Patient/family education;Neuromuscular re-education    PT Goals (Current goals can be found in the Care Plan section)  Acute Rehab PT Goals Patient Stated Goal: to feel better PT Goal Formulation: With patient Time For Goal Achievement: 06/04/22 Potential to Achieve Goals: Good    Frequency Min 2X/week     Co-evaluation               AM-PAC PT "6 Clicks" Mobility  Outcome Measure Help needed turning from your back to your side while in a flat bed without using bedrails?: None Help needed moving from lying on your back to sitting on  the side of a flat bed without using bedrails?: None Help needed moving to and from a bed to a chair (including a wheelchair)?: A Little Help needed standing up from a chair using your arms (e.g., wheelchair or bedside chair)?: A Little Help needed to walk in hospital room?: A Little Help needed climbing 3-5 steps with a railing? : A Lot 6 Click Score: 19    End of Session Equipment Utilized During Treatment: Gait belt Activity Tolerance: Patient tolerated treatment well Patient left: in chair;with call bell/phone within reach;with chair alarm set Nurse Communication: Mobility status PT Visit Diagnosis: Unsteadiness on feet (R26.81);Muscle weakness (generalized) (M62.81);Repeated falls (R29.6);Difficulty in walking, not elsewhere classified (R26.2)    Time: 7001-7494 PT Time Calculation (min) (ACUTE ONLY): 27 min   Charges:   PT Evaluation $PT Eval Low Complexity: 1 Low PT Treatments $Therapeutic Exercise: 23-37 mins        Lieutenant Diego PT, DPT 11:22 AM,05/21/22

## 2022-05-27 ENCOUNTER — Encounter: Payer: Self-pay | Admitting: Emergency Medicine

## 2022-05-27 ENCOUNTER — Emergency Department
Admission: EM | Admit: 2022-05-27 | Discharge: 2022-06-06 | Disposition: A | Discharge status: Home / Self Care | Payer: Medicare HMO | Attending: Emergency Medicine | Admitting: Emergency Medicine

## 2022-05-27 ENCOUNTER — Other Ambulatory Visit: Payer: Self-pay

## 2022-05-27 DIAGNOSIS — Z79899 Other long term (current) drug therapy: Secondary | ICD-10-CM | POA: Insufficient documentation

## 2022-05-27 DIAGNOSIS — Z20822 Contact with and (suspected) exposure to covid-19: Secondary | ICD-10-CM | POA: Insufficient documentation

## 2022-05-27 DIAGNOSIS — N400 Enlarged prostate without lower urinary tract symptoms: Secondary | ICD-10-CM | POA: Diagnosis present

## 2022-05-27 DIAGNOSIS — F10231 Alcohol dependence with withdrawal delirium: Secondary | ICD-10-CM | POA: Insufficient documentation

## 2022-05-27 DIAGNOSIS — R531 Weakness: Secondary | ICD-10-CM | POA: Diagnosis not present

## 2022-05-27 DIAGNOSIS — F419 Anxiety disorder, unspecified: Secondary | ICD-10-CM | POA: Diagnosis present

## 2022-05-27 DIAGNOSIS — R296 Repeated falls: Secondary | ICD-10-CM | POA: Diagnosis not present

## 2022-05-27 DIAGNOSIS — W19XXXA Unspecified fall, initial encounter: Secondary | ICD-10-CM | POA: Diagnosis present

## 2022-05-27 DIAGNOSIS — F10929 Alcohol use, unspecified with intoxication, unspecified: Secondary | ICD-10-CM | POA: Diagnosis present

## 2022-05-27 DIAGNOSIS — K7030 Alcoholic cirrhosis of liver without ascites: Secondary | ICD-10-CM | POA: Diagnosis present

## 2022-05-27 DIAGNOSIS — R2681 Unsteadiness on feet: Secondary | ICD-10-CM | POA: Diagnosis not present

## 2022-05-27 DIAGNOSIS — I10 Essential (primary) hypertension: Secondary | ICD-10-CM | POA: Diagnosis not present

## 2022-05-27 DIAGNOSIS — F32A Depression, unspecified: Secondary | ICD-10-CM | POA: Diagnosis present

## 2022-05-27 DIAGNOSIS — F1090 Alcohol use, unspecified, uncomplicated: Secondary | ICD-10-CM | POA: Diagnosis present

## 2022-05-27 DIAGNOSIS — N3000 Acute cystitis without hematuria: Secondary | ICD-10-CM

## 2022-05-27 DIAGNOSIS — F1020 Alcohol dependence, uncomplicated: Secondary | ICD-10-CM | POA: Diagnosis not present

## 2022-05-27 DIAGNOSIS — F10931 Alcohol use, unspecified with withdrawal delirium: Secondary | ICD-10-CM | POA: Diagnosis present

## 2022-05-27 DIAGNOSIS — F10920 Alcohol use, unspecified with intoxication, uncomplicated: Secondary | ICD-10-CM | POA: Diagnosis not present

## 2022-05-27 LAB — COMPREHENSIVE METABOLIC PANEL WITH GFR
ALT: 39 U/L (ref 0–44)
AST: 38 U/L (ref 15–41)
Albumin: 4.2 g/dL (ref 3.5–5.0)
Alkaline Phosphatase: 92 U/L (ref 38–126)
Anion gap: 11 (ref 5–15)
BUN: 6 mg/dL — ABNORMAL LOW (ref 8–23)
CO2: 25 mmol/L (ref 22–32)
Calcium: 8.6 mg/dL — ABNORMAL LOW (ref 8.9–10.3)
Chloride: 93 mmol/L — ABNORMAL LOW (ref 98–111)
Creatinine, Ser: 0.46 mg/dL — ABNORMAL LOW (ref 0.61–1.24)
GFR, Estimated: >60 mL/min
Glucose, Bld: 108 mg/dL — ABNORMAL HIGH (ref 70–99)
Potassium: 3.6 mmol/L (ref 3.5–5.1)
Sodium: 129 mmol/L — ABNORMAL LOW (ref 135–145)
Total Bilirubin: 0.7 mg/dL (ref 0.3–1.2)
Total Protein: 7.6 g/dL (ref 6.5–8.1)

## 2022-05-27 LAB — CBC WITH DIFFERENTIAL/PLATELET
Abs Immature Granulocytes: 0.02 10*3/uL (ref 0.00–0.07)
Basophils Absolute: 0.1 10*3/uL (ref 0.0–0.1)
Basophils Relative: 1 %
Eosinophils Absolute: 0.1 10*3/uL (ref 0.0–0.5)
Eosinophils Relative: 1 %
HCT: 40.9 % (ref 39.0–52.0)
Hemoglobin: 13.9 g/dL (ref 13.0–17.0)
Immature Granulocytes: 0 %
Lymphocytes Relative: 63 %
Lymphs Abs: 6.3 10*3/uL — ABNORMAL HIGH (ref 0.7–4.0)
MCH: 30.9 pg (ref 26.0–34.0)
MCHC: 34 g/dL (ref 30.0–36.0)
MCV: 90.9 fL (ref 80.0–100.0)
Monocytes Absolute: 0.6 10*3/uL (ref 0.1–1.0)
Monocytes Relative: 6 %
Neutro Abs: 2.9 10*3/uL (ref 1.7–7.7)
Neutrophils Relative %: 29 %
Platelets: 308 10*3/uL (ref 150–400)
RBC: 4.5 MIL/uL (ref 4.22–5.81)
RDW: 12.4 % (ref 11.5–15.5)
Smear Review: NORMAL
WBC: 10 10*3/uL (ref 4.0–10.5)
nRBC: 0 % (ref 0.0–0.2)

## 2022-05-27 LAB — RESP PANEL BY RT-PCR (FLU A&B, COVID) ARPGX2
Influenza A by PCR: NEGATIVE
Influenza B by PCR: NEGATIVE
SARS Coronavirus 2 by RT PCR: NEGATIVE

## 2022-05-27 LAB — ETHANOL: Alcohol, Ethyl (B): 270 mg/dL — ABNORMAL HIGH

## 2022-05-27 LAB — LIPASE, BLOOD: Lipase: 47 U/L (ref 11–51)

## 2022-05-27 MED ORDER — THIAMINE HCL 100 MG/ML IJ SOLN
100.0000 mg | Freq: Every day | INTRAMUSCULAR | Status: DC
Start: 2022-05-27 — End: 2022-06-06
  Filled 2022-05-27 (×3): qty 2

## 2022-05-27 MED ORDER — LORAZEPAM 2 MG PO TABS
0.0000 mg | ORAL_TABLET | Freq: Two times a day (BID) | ORAL | Status: AC
  Administered 2022-05-30: 2 mg via ORAL
  Administered 2022-05-31: 1 mg via ORAL
  Filled 2022-05-27 (×2): qty 1

## 2022-05-27 MED ORDER — LORAZEPAM 2 MG PO TABS
0.0000 mg | ORAL_TABLET | Freq: Four times a day (QID) | ORAL | Status: AC
  Administered 2022-05-28 (×4): 2 mg via ORAL
  Administered 2022-05-29: 1 mg via ORAL
  Filled 2022-05-27 (×6): qty 1

## 2022-05-27 MED ORDER — LORAZEPAM 2 MG/ML IJ SOLN: 0.0000 mg | Freq: Two times a day (BID) | INTRAMUSCULAR | Status: AC

## 2022-05-27 MED ORDER — LORAZEPAM 2 MG/ML IJ SOLN
0.0000 mg | Freq: Four times a day (QID) | INTRAMUSCULAR | Status: AC
  Administered 2022-05-29: 2 mg via INTRAVENOUS

## 2022-05-27 MED ORDER — THIAMINE HCL 100 MG PO TABS
100.0000 mg | ORAL_TABLET | Freq: Every day | ORAL | Status: DC
  Administered 2022-05-27 – 2022-06-06 (×11): 100 mg via ORAL
  Filled 2022-05-27 (×11): qty 1
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
## 2022-05-27 NOTE — ED Triage Notes (Signed)
Pt arrived via EMS from home with [facility]. Pt  guardian, DSS, did a wellness check on pt and found pt intoxicated and house in [place]. Pt states he drinks beer daily.  IVC papers taken out by guardian and and pt send for further evaluation. Denies SI or HI. No complaints at this time.
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
## 2022-05-27 NOTE — ED Notes (Signed)
IVC PAPERS   RESCINDED  PER  DR  [facility] MD  Gerrit Friends  RN

## 2022-05-27 NOTE — Progress Notes (Signed)
The only debridement was of his toenails.

## 2022-05-28 DIAGNOSIS — F10920 Alcohol use, unspecified with intoxication, uncomplicated: Secondary | ICD-10-CM | POA: Diagnosis not present

## 2022-05-28 MED ORDER — TUBERCULIN PPD 5 UNIT/0.1ML ID SOLN
5.0000 [IU] | INTRADERMAL | Status: AC
  Administered 2022-05-28: 5 [IU] via INTRADERMAL
  Filled 2022-05-28: qty 0.1

## 2022-05-28 NOTE — ED Notes (Signed)
Patient changed by ED tech and positioned on side due to redness on buttocks. Patient advised he needs to reposition self every two hours to prevent skin break down from pressure that can cause pressure ulcers. Patient verbalized understanding.

## 2022-05-28 NOTE — ED Notes (Signed)
Report received from Carroll Sage , Conservation officer, nature. On initial round after report Pt is warm/dry, resting quietly in room without any s/s of distress.  Will continue to monitor throughout shift as ordered for any changes in behaviors and for continued safety.
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
## 2022-05-28 NOTE — ED Notes (Signed)
Pt cleaned of urine at this time, new bed linen new gown new purwick and brief placed on pt

## 2022-05-28 NOTE — ED Notes (Signed)
Pt insisting that he does not have a legal guardian.  Pt requesting to leave.  Staff to follow up on verifying guardianship
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
## 2022-05-28 NOTE — TOC Initial Note (Signed)
Transition of Care (TOC) - Initial/Assessment Note    Patient Details  Name: Douglas Peterson. MRN: [id number] Date of Birth: [birth year]  Transition of Care [facility]) CM/SW Contact:    Shelbie Hutching, RN Phone Number: 05/28/2022, 11:54 AM  Clinical Narrative:                 [facility] consult acknowledged.  [name] under the guardianship of [place] [phone], she will secure email me the guardianship papers.  Plan will be for patient to discharge to an ALF, potentially [facility].  PT and OT ordered to evaluate to ensure patient appropriate for ALF level of care.          Patient Goals and CMS Choice        Expected Discharge Plan and Services                                                Prior Living Arrangements/Services                       Activities of Daily Living      Permission Sought/Granted                  Emotional Assessment              Admission diagnosis:  IVC Patient Active Problem List   Diagnosis Date Noted   Maggot infestation feet 05/11/2022   Unable to care for self 05/11/2022   Debility 04/25/2022   Weakness 04/23/2022   Epistaxis 04/23/2022   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   Generalized weakness 03/18/2022   Nondisplaced fracture of shaft of left clavicle, initial encounter for closed fracture 03/18/2022   BPH (benign prostatic hyperplasia) 03/18/2022   Clavicle fracture 03/17/2022   Fall 03/17/2022   CAP (community acquired pneumonia) 03/13/2022   Altered mental status, unspecified 02/20/2022   Hypotension 02/19/2022   Dysphonia 08/13/2021   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   Paresthesia of right upper extremity 08/13/2021   SIRS (systemic inflammatory response syndrome) ([place]) 08/05/2020   GERD (gastroesophageal reflux disease) 06/05/2020   Alcohol use disorder, severe, dependence ([place]) 05/28/2020   Paroxysmal A-fib ([place]) 03/17/2020   Primary insomnia  02/06/2020   Left inguinal hernia 01/31/2020   Encounter for competency evaluation    Depression 01/17/2020   Pancytopenia ([place])    Hypomagnesemia    Alcoholic cirrhosis of liver without ascites ([place])    ETOH abuse    Alcohol intoxication ([place])    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) 02/27/2017   Steatohepatitis 12/03/2016   Abnormal liver enzymes 06/17/2016   Hereditary hemochromatosis ([place]) 06/17/2016   Anxiety 07/16/2015   Hyponatremia 07/09/2015   H/O traumatic brain injury 05/22/2014   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[place]) 05/22/2014   PCP:  Pcp, No Pharmacy:   [facility] [address], [place] - [place] [address] Phone: [phone] Fax: [phone]     Social Determinants of Health (SDOH) Interventions    Readmission Risk Interventions    04/29/2022   10:26 AM 03/14/2022   12:48 PM 08/23/2020   11:52 AM  Readmission Risk Prevention Plan  Transportation Screening Complete Complete Complete  PCP or Specialist Appt within 3-5 Days  Complete   HRI or [place] Complete Complete   Social Work Consult for [place] Planning/Counseling Complete Complete   Palliative Care Screening Not Applicable Not Applicable   Medication Review Press photographer) Complete Complete Complete  HRI or [place]   Not Applicable

## 2022-05-28 NOTE — Evaluation (Signed)
Occupational Therapy Evaluation Patient Details Name: Douglas Peterson. MRN: [id number] DOB: [birth year] Today's Date: 05/28/2022   History of Present Illness Douglas Peterson. is a 71 y.o. 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  Per paperwork DSS went for a well check to see the patient found his house in disarray multiple beer cans and was placed under IVC due to inability to care for himself.  Patient does admit to drinking alcohol.  He says that he is "sick" complains of abdominal pain.  Says that DSS broke into his house.  He lives alone denies suicidal ideation.   Clinical Impression   Pt was seen for OT evaluation this date. Prior to hospital admission, pt was using a SPC for mobility and MOD I for ADLs. Pt lives alone in one level house with 2 steps to enter. Pt presents to acute OT demonstrating impaired ADL performance and functional mobility 2/2 decreased activity tolerance and functional strength/ROM/balance deficits. Pt currently requires MOD I for bed mobility and CGA with min vcs for safe hand placement for STS. CGA + SPC with min vcs for sequencing for simulated ADL t/f ~29f. SBA for donning/doffing socks while seated EOB. SUPERVISION for grooming tasks bed level - attempted grooming task while standing with no UE support, but unable due to limited standing balance and L UE ROM. Pt left in bed with all needs met. Pt would benefit from skilled OT to address noted impairments and functional limitations (see below for any additional details). Upon hospital discharge, recommend HHOT to maximize pt safety and return to PLOF.      Recommendations for follow up therapy are one component of a multi-disciplinary discharge planning process, led by the attending physician.  Recommendations may be updated based on patient status, additional functional criteria and insurance authorization.   Follow Up Recommendations   Home health OT    Assistance Recommended at Discharge Intermittent Supervision/Assistance  Patient can return home with the following A little help with walking and/or transfers;A little help with bathing/dressing/bathroom;Assistance with cooking/housework;Direct supervision/assist for medications management;Assist for transportation;Help with stairs or ramp for entrance    Functional Status Assessment  Patient has had a recent decline in their functional status and demonstrates the ability to make significant improvements in function in a reasonable and predictable amount of time.  Equipment Recommendations  Other (comment) ([facility]    Recommendations for Other Services       Precautions / Restrictions Precautions Precautions: Fall Restrictions Weight Bearing Restrictions: No      Mobility Bed Mobility Overal bed mobility: Modified Independent                  Transfers Overall transfer level: Needs assistance Equipment used: Straight cane Transfers: Sit to/from Stand Sit to Stand: Min guard                  Balance Overall balance assessment: Needs assistance Sitting-balance support: No upper extremity supported, Feet unsupported Sitting balance-Leahy Scale: Fair     Standing balance support: Single extremity supported Standing balance-Leahy Scale: Fair                             ADL either performed or assessed with clinical judgement   ADL Overall ADL's : Needs assistance/impaired  General ADL Comments: SBA for donning/doffing socks while seated EOB. SUPERVISION for grooming tasks bed level - attempted grooming task while standing with no UE support, but unable due to limited standing balance and L UE ROM. CGA + SPC with min vcs for sequencing for simulated ADL t/f ~14f.                  Pertinent Vitals/Pain Pain Assessment Pain Assessment: Faces Faces Pain Scale: No hurt      Hand Dominance Left   Extremity/Trunk Assessment Upper Extremity Assessment Upper Extremity Assessment: RUE deficits/detail;Generalized weakness RUE Deficits / Details: R UE contracture   Lower Extremity Assessment Lower Extremity Assessment: Generalized weakness   Cervical / Trunk Assessment Cervical / Trunk Assessment: Normal   Communication Communication Communication: No difficulties   Cognition Arousal/Alertness: Awake/alert Behavior During Therapy: WFL for tasks assessed/performed Overall Cognitive Status: Within Functional Limits for tasks assessed                                                  Home Living Family/patient expects to be discharged to:: Private residence Living Arrangements: Alone Available Help at Discharge: Available PRN/intermittently;Family Type of Home: House Home Access: Stairs to enter ECenterPoint Energyof Steps: 2 Entrance Stairs-Rails: Can reach both Home Layout: One level     Bathroom Shower/Tub: TTeacher, early years/pre Standard     Home Equipment: RConservation officer, nature(2 wheels);Cane - quad;BSC/3in1;Shower seat;Cane - single point          Prior Functioning/Environment Prior Level of Function : Independent/Modified Independent             Mobility Comments: Pt uses SPC for mobility - household mainly. ADLs Comments: MOD I for ADLs        OT Problem List: Decreased strength;Decreased range of motion;Decreased activity tolerance;Impaired balance (sitting and/or standing);Decreased safety awareness      OT Treatment/Interventions: Self-care/ADL training;Therapeutic exercise;Energy conservation;DME and/or AE instruction;Therapeutic activities;Patient/family education;Balance training    OT Goals(Current goals can be found in the care plan section) Acute Rehab OT Goals Patient Stated Goal: to get stronger OT Goal Formulation: With patient Time For Goal Achievement: 06/11/22 Potential to  Achieve Goals: Good ADL Goals Pt Will Perform Grooming: with modified independence;sitting Pt Will Perform Lower Body Dressing: with modified independence;sit to/from stand Pt Will Transfer to Toilet: with modified independence;ambulating;regular height toilet  OT Frequency: Min 2X/week       AM-PAC OT "6 Clicks" Daily Activity     Outcome Measure Help from another person eating meals?: None Help from another person taking care of personal grooming?: A Little Help from another person toileting, which includes using toliet, bedpan, or urinal?: A Little Help from another person bathing (including washing, rinsing, drying)?: A Little Help from another person to put on and taking off regular upper body clothing?: A Little Help from another person to put on and taking off regular lower body clothing?: A Little 6 Click Score: 19   End of Session Equipment Utilized During Treatment: Gait belt  Activity Tolerance: Patient tolerated treatment well Patient left: in bed;with call bell/phone within reach  OT Visit Diagnosis: Muscle weakness (generalized) (M62.81);Unsteadiness on feet (R26.81)                Time: 15427-0623OT Time Calculation (min): 10 min Charges:  OT General Charges $OT  Visit: 1 Visit OT Evaluation $OT Eval Low Complexity: 1 Low  [employer], OTDS  [employer] 05/28/2022, 3:12 PM

## 2022-05-28 NOTE — Progress Notes (Signed)
   05/28/22 1500  Clinical Encounter Type  Visited With Patient not available  Referral From Social work  Consult/Referral To [employer]  attempted to visit w pt per referral from [place], [place], due to change in guardianship.  Pt sleeping at this time. Chaplain B checkedin with staff on the quad. Let them know of chaplain's awareness and will advise on-call chaplain.  Spiritual care support available as needed.

## 2022-05-28 NOTE — NC FL2 (Signed)
[place] LEVEL OF CARE SCREENING TOOL     IDENTIFICATION  Patient Name: Douglas Peterson. Birthdate: [birth year] Sex: male Admission Date (Current Location): 05/27/2022  [place] and [place] Number:  Engineering geologist and Address:  [facility], [address], [place], [place] [address]      Provider Number: [id number]  Attending Physician Name and Address:  No att. providers found  Relative Name and Phone Number:  [place] DSS guardian [phone]    Current Level of Care: Other (Comment) (ED boarder) Recommended Level of Care: [place] Prior Approval Number:    Date Approved/Denied:   PASRR Number:    Discharge Plan: Domiciliary (Rest home) (ALF)    Current Diagnoses: Patient Active Problem List   Diagnosis Date Noted   Maggot infestation feet 05/11/2022   Unable to care for self 05/11/2022   Debility 04/25/2022   Weakness 04/23/2022   Epistaxis 04/23/2022   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   Generalized weakness 03/18/2022   Nondisplaced fracture of shaft of left clavicle, initial encounter for closed fracture 03/18/2022   BPH (benign prostatic hyperplasia) 03/18/2022   Clavicle fracture 03/17/2022   Fall 03/17/2022   CAP (community acquired pneumonia) 03/13/2022   Altered mental status, unspecified 02/20/2022   Hypotension 02/19/2022   Dysphonia 08/13/2021   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   Paresthesia of right upper extremity 08/13/2021   SIRS (systemic inflammatory response syndrome) ([place]) 08/05/2020   GERD (gastroesophageal reflux disease) 06/05/2020   Alcohol use disorder, severe, dependence ([place]) 05/28/2020   Paroxysmal A-fib ([place]) 03/17/2020   Primary insomnia 02/06/2020   Left inguinal hernia 01/31/2020   Encounter for competency evaluation    Depression 01/17/2020   Pancytopenia ([place])    Hypomagnesemia    Alcoholic cirrhosis of liver without ascites ([place])     ETOH abuse    Alcohol intoxication ([place])    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[place]) 09/27/2019   Alcohol withdrawal delirium, acute, hyperactive ([place]) 09/21/2019   Pressure injury of ankle, stage 1 08/15/2019   Palliative care by specialist    Closed fracture of right proximal humerus 03/19/2019   Vitamin D deficiency 01/11/2019   Osteoporosis 12/22/2018   Closed nondisplaced fracture of acromial process of right scapula with routine healing 11/15/2018   Compression fracture of L2 vertebra with routine healing 11/15/2018   Delirium tremens ([place]) 03/08/2018   HTN (hypertension) 09/16/2017   Encephalopathy, 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[place]) 02/27/2017   Steatohepatitis 12/03/2016   Abnormal liver enzymes 06/17/2016   Hereditary hemochromatosis ([place]) 06/17/2016   Anxiety 07/16/2015   Hyponatremia 07/09/2015   H/O traumatic brain injury 05/22/2014   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[place]) 05/22/2014    Orientation RESPIRATION BLADDER Height & Weight     Self, Time, Situation, Place  Normal Incontinent Weight: 59 kg Height:  5' 4"  (162.6 cm)  BEHAVIORAL SYMPTOMS/MOOD NEUROLOGICAL BOWEL NUTRITION STATUS      Incontinent Diet (regular)  AMBULATORY STATUS COMMUNICATION OF NEEDS Skin   Supervision Verbally Other (Comment) (Buttocks red from incontinence)                       Personal Care Assistance Level of Assistance  Bathing, Feeding, Dressing Bathing Assistance: Limited assistance Feeding assistance: Limited assistance Dressing Assistance: Limited assistance     Functional Limitations Info  Sight, Hearing, Speech Sight Info: Adequate Hearing Info: Adequate Speech Info: Impaired    SPECIAL CARE FACTORS FREQUENCY  PT (  By licensed PT), OT (By licensed OT)     PT Frequency: home health 2 tims per week OT Frequency: home health 2 times per week            Contractures Contractures Info: Not present    Additional Factors Info  Code Status, Allergies Code  Status Info: Full Allergies Info: hydrochlorothiazide           Current Medications (05/28/2022):  This is the current hospital active medication list Current Facility-Administered Medications  Medication Dose Route Frequency Provider Last Rate Last Admin   LORazepam (ATIVAN) injection 0-4 mg  0-4 mg Intravenous Q6H Rada Hay, MD       Or   LORazepam (ATIVAN) tablet 0-4 mg  0-4 mg Oral Q6H Rada Hay, MD   2 mg at 05/28/22 1209   [START ON 05/30/2022] LORazepam (ATIVAN) injection 0-4 mg  0-4 mg Intravenous Q12H Rada Hay, MD       Or   Derrill Memo ON 05/30/2022] LORazepam (ATIVAN) tablet 0-4 mg  0-4 mg Oral Q12H Rada Hay, MD       thiamine tablet 100 mg  100 mg Oral Daily Rada Hay, MD   100 mg at 05/28/22 0263   Or   thiamine (B-1) injection 100 mg  100 mg Intravenous Daily Rada Hay, MD       Current Outpatient Medications  Medication Sig Dispense Refill   acetaminophen (TYLENOL) 500 MG tablet Take 2 tablets (1,000 mg total) by mouth every 8 (eight) hours as needed for mild pain or moderate pain. (Patient not taking: Reported on 05/28/2022) 15 tablet 0   atenolol (TENORMIN) 25 MG tablet Take 1 tablet (25 mg total) by mouth daily. (Patient not taking: Reported on 05/28/2022) 30 tablet 0   atorvastatin (LIPITOR) 10 MG tablet Take 1 tablet (10 mg total) by mouth daily. 30 tablet 0   feeding supplement (ENSURE ENLIVE / ENSURE PLUS) LIQD Take 237 mLs by mouth 2 (two) times daily between meals. 785 mL 12   folic acid (FOLVITE) 1 MG tablet Take 1 tablet (1 mg total) by mouth daily. (Patient not taking: Reported on 05/28/2022) 30 tablet 0   Multiple Vitamin (MULTIVITAMIN WITH MINERALS) TABS tablet Take 1 tablet by mouth daily. (Patient not taking: Reported on 05/28/2022) 30 tablet 0   ondansetron (ZOFRAN) 4 MG tablet Take 4 mg by mouth every 8 (eight) hours as needed. (Patient not taking: Reported on 05/28/2022)     polyethylene glycol powder  (GLYCOLAX/MIRALAX) 17 GM/SCOOP powder Take 17 g by mouth daily as needed.     senna-docusate (SENOKOT-S) 8.6-50 MG tablet Take 2 tablets by mouth at bedtime as needed.     thiamine 100 MG tablet Take 1 tablet (100 mg total) by mouth daily. (Patient not taking: Reported on 05/28/2022) 30 tablet 0   tiZANidine (ZANAFLEX) 4 MG tablet Take 2 tablets (8 mg total) by mouth every 8 (eight) hours as needed for muscle spasms. (Patient not taking: Reported on 05/28/2022)  0     Discharge Medications: Please see discharge summary for a list of discharge medications.  Relevant Imaging Results:  Relevant Lab Results:   Additional Information SSN: [id number]  Shelbie Hutching, RN

## 2022-05-28 NOTE — ED Notes (Signed)
VOLUNTARY continues to await TOC placement

## 2022-05-28 NOTE — TOC Progression Note (Signed)
Transition of Care (TOC) - Progression Note    Patient Details  Name: Douglas Peterson. MRN: [id number] Date of Birth: April 10, 1951  Transition [facility]) CM/SW Contact  Shelbie Hutching, RN Phone Number: 05/28/2022, 4:34 PM  Clinical Narrative:    Patient has been evaluated by PT and they report he is appropriate for ALF with home health PT and OT.  Toula Moos has a place for him at [facility].  A TB skin test is needed and an FL2 and any updated notes.  RNCM will get these and email them to [place] over at [facility].     Expected Discharge Plan: Assisted Living Barriers to Discharge: Other (must enter comment) (ALF placement)  Expected Discharge Plan and Services Expected Discharge Plan: Assisted Living   Discharge Planning Services: CM Consult   Living arrangements for the past 2 months: Single Family Home                 DME Arranged: N/A DME Agency: NA       HH Arranged: PT, OT           Social Determinants of Health (SDOH) Interventions    Readmission Risk Interventions    04/29/2022   10:26 AM 03/14/2022   12:48 PM 08/23/2020   11:52 AM  Readmission Risk Prevention Plan  Transportation Screening Complete Complete Complete  PCP or Specialist Appt within 3-5 Days  Complete   HRI or [place] Complete Complete   Social Work Consult for [place] Planning/Counseling Complete Complete   Palliative Care Screening Not Applicable Not Applicable   Medication Review Press photographer) Complete Complete Complete  HRI or [place]   Not Applicable

## 2022-05-29 LAB — URINE DRUG SCREEN, QUALITATIVE (ARMC ONLY)
Amphetamines, Ur Screen: NOT DETECTED
Barbiturates, Ur Screen: NOT DETECTED
Benzodiazepine, Ur Scrn: POSITIVE — AB
Cannabinoid 50 Ng, Ur ~~LOC~~: NOT DETECTED
Cocaine Metabolite,Ur ~~LOC~~: NOT DETECTED
MDMA (Ecstasy)Ur Screen: NOT DETECTED
Methadone Scn, Ur: NOT DETECTED
Opiate, Ur Screen: NOT DETECTED
Phencyclidine (PCP) Ur S: NOT DETECTED
Tricyclic, Ur Screen: NOT DETECTED

## 2022-05-29 NOTE — ED Notes (Signed)
Pt given dinner tray.

## 2022-05-29 NOTE — ED Notes (Signed)
VOL/pending placement

## 2022-05-29 NOTE — ED Notes (Signed)
Pt given warm blanket.

## 2022-05-29 NOTE — ED Notes (Signed)
VOL PENDING PLACEMENT

## 2022-05-29 NOTE — ED Notes (Signed)
Pt awake and eating breakfast at this time.

## 2022-05-29 NOTE — ED Notes (Signed)
Report given to Hannah,RN

## 2022-05-29 NOTE — Progress Notes (Signed)
Physical Therapy Treatment Patient Details Name: Douglas Peterson. MRN: [id number] DOB: 1951/10/01 Today's Date: 05/29/2022   History of Present Illness Douglas Peterson. is a 71 y.o. 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  Per paperwork DSS went for a well check to see the patient found his house in disarray multiple beer cans and was placed under IVC due to inability to care for himself.  Patient does admit to drinking alcohol.  He says that he is "sick" complains of abdominal pain.  Says that DSS broke into his house.  He lives alone denies suicidal ideation.    PT Comments    Pt received supine in bed agreeable to PT. Requiring minA to transfer due to ED cot. Standing minguard with SPC but limited to ambulating ~20' in room with minguard. Pt remains very tremulous requiring mod to max VC's for correct SPC sequencing and keeping close to BOS  for improved stability. Mod A for LE's to return to bed. Lunch tray placed in front of pt with all needs in reach. Only physical assist needed was for bed mobility due to nature of ED cot and height of bed. If pt continues to decline in mobility pt may warrants SNF versus ALF.    Recommendations for follow up therapy are one component of a multi-disciplinary discharge planning process, led by the attending physician.  Recommendations may be updated based on patient status, additional functional criteria and insurance authorization.  Follow Up Recommendations  Home health PT Can patient physically be transported by private vehicle: Yes   Assistance Recommended at Discharge Intermittent Supervision/Assistance  Patient can return home with the following A little help with walking and/or transfers;A little help with bathing/dressing/bathroom;Assist for transportation;Help with stairs or ramp for entrance   Equipment Recommendations  None recommended by PT    Recommendations  for Other Services       Precautions / Restrictions Precautions Precautions: Fall Restrictions Weight Bearing Restrictions: No     Mobility  Bed Mobility Overal bed mobility: Needs Assistance Bed Mobility: Supine to Sit, Sit to Supine     Supine to sit: Min assist Sit to supine: Max assist   General bed mobility comments: LE management returning to bed Patient Response: Flat affect, Cooperative  Transfers Overall transfer level: Needs assistance Equipment used: Straight cane Transfers: Sit to/from Stand Sit to Stand: Min guard                Ambulation/Gait Ambulation/Gait assistance: Min guard Gait Distance (Feet): 20 Feet Assistive device: Straight cane Gait Pattern/deviations: Shuffle, Trunk flexed, Narrow base of support       General Gait Details: Continuing to demonstrate decreased capacity for walking up to 20' today.   Stairs             Wheelchair Mobility    Modified Rankin (Stroke Patients Only)       Balance Overall balance assessment: Needs assistance Sitting-balance support: No upper extremity supported, Feet unsupported Sitting balance-Leahy Scale: Fair Sitting balance - Comments: no apparent LOB   Standing balance support: Single extremity supported Standing balance-Leahy Scale: Fair Standing balance comment: Required initial minA for upright standing due to posterior lean.                            Cognition Arousal/Alertness: Awake/alert Behavior During Therapy: WFL for tasks assessed/performed Overall Cognitive Status: Within Functional Limits for tasks  assessed                                          Exercises      General Comments        Pertinent Vitals/Pain Pain Assessment Pain Assessment: No/denies pain    Home Living                          Prior Function            PT Goals (current goals can now be found in the care plan section)      Frequency     Min 2X/week      PT Plan Current plan remains appropriate    Co-evaluation              AM-PAC PT "6 Clicks" Mobility   Outcome Measure  Help needed turning from your back to your side while in a flat bed without using bedrails?: A Little Help needed moving from lying on your back to sitting on the side of a flat bed without using bedrails?: A Little Help needed moving to and from a bed to a chair (including a wheelchair)?: A Little Help needed standing up from a chair using your arms (e.g., wheelchair or bedside chair)?: A Little Help needed to walk in hospital room?: A Lot Help needed climbing 3-5 steps with a railing? : Total 6 Click Score: 15    End of Session Equipment Utilized During Treatment: Gait belt Activity Tolerance: Patient limited by fatigue Patient left: in bed;with call bell/phone within reach Nurse Communication: Mobility status PT Visit Diagnosis: Unsteadiness on feet (R26.81);Muscle weakness (generalized) (M62.81);Repeated falls (R29.6);Difficulty in walking, not elsewhere classified (R26.2);Other abnormalities of gait and mobility (R26.89)     Time: 1350-1406 PT Time Calculation (min) (ACUTE ONLY): 16 min  Charges:  $Gait Training: 8-22 mins                     Salem Caster. Fairly IV, PT, DPT Physical Therapist- [facility]  05/29/2022, 2:43 PM

## 2022-05-29 NOTE — ED Notes (Signed)
Pt breakfast tray placed on bedside table, pt resting with eyes closed and even respirations

## 2022-05-30 ENCOUNTER — Emergency Department: Payer: Medicare HMO

## 2022-05-30 LAB — CBC WITH DIFFERENTIAL/PLATELET
Abs Immature Granulocytes: 0.01 10*3/uL (ref 0.00–0.07)
Basophils Absolute: 0.1 10*3/uL (ref 0.0–0.1)
Basophils Relative: 1 %
Eosinophils Absolute: 0.2 10*3/uL (ref 0.0–0.5)
Eosinophils Relative: 3 %
HCT: 46.4 % (ref 39.0–52.0)
Hemoglobin: 15.4 g/dL (ref 13.0–17.0)
Immature Granulocytes: 0 %
Lymphocytes Relative: 43 %
Lymphs Abs: 2.5 10*3/uL (ref 0.7–4.0)
MCH: 30.2 pg (ref 26.0–34.0)
MCHC: 33.2 g/dL (ref 30.0–36.0)
MCV: 91 fL (ref 80.0–100.0)
Monocytes Absolute: 0.5 10*3/uL (ref 0.1–1.0)
Monocytes Relative: 8 %
Neutro Abs: 2.6 10*3/uL (ref 1.7–7.7)
Neutrophils Relative %: 45 %
Platelets: 255 10*3/uL (ref 150–400)
RBC: 5.1 MIL/uL (ref 4.22–5.81)
RDW: 12.2 % (ref 11.5–15.5)
WBC: 5.7 10*3/uL (ref 4.0–10.5)
nRBC: 0 % (ref 0.0–0.2)

## 2022-05-30 LAB — URINALYSIS, COMPLETE (UACMP) WITH MICROSCOPIC
Bilirubin Urine: NEGATIVE
Glucose, UA: NEGATIVE mg/dL
Hgb urine dipstick: NEGATIVE
Ketones, ur: NEGATIVE mg/dL
Leukocytes,Ua: NEGATIVE
Nitrite: POSITIVE — AB
Protein, ur: 30 mg/dL — AB
Specific Gravity, Urine: 1.02 (ref 1.005–1.030)
Squamous Epithelial / HPF: NONE SEEN (ref 0–5)
pH: 9 — ABNORMAL HIGH (ref 5.0–8.0)

## 2022-05-30 LAB — COMPREHENSIVE METABOLIC PANEL
ALT: 56 U/L — ABNORMAL HIGH (ref 0–44)
AST: 53 U/L — ABNORMAL HIGH (ref 15–41)
Albumin: 4.7 g/dL (ref 3.5–5.0)
Alkaline Phosphatase: 103 U/L (ref 38–126)
Anion gap: 9 (ref 5–15)
BUN: 8 mg/dL (ref 8–23)
CO2: 28 mmol/L (ref 22–32)
Calcium: 9.6 mg/dL (ref 8.9–10.3)
Chloride: 100 mmol/L (ref 98–111)
Creatinine, Ser: 0.54 mg/dL — ABNORMAL LOW (ref 0.61–1.24)
GFR, Estimated: >60 mL/min (ref >60)
Glucose, Bld: 104 mg/dL — ABNORMAL HIGH (ref 70–99)
Potassium: 3.8 mmol/L (ref 3.5–5.1)
Sodium: 137 mmol/L (ref 135–145)
Total Bilirubin: 1 mg/dL (ref 0.3–1.2)
Total Protein: 8.3 g/dL — ABNORMAL HIGH (ref 6.5–8.1)

## 2022-05-30 LAB — TROPONIN I (HIGH SENSITIVITY)
Troponin I (High Sensitivity): 4 ng/L (ref <18)
Troponin I (High Sensitivity): 3 ng/L (ref <18)

## 2022-05-30 LAB — LIPASE, BLOOD: Lipase: 37 U/L (ref 11–51)

## 2022-05-30 MED ORDER — CEPHALEXIN 500 MG PO CAPS
500.0000 mg | ORAL_CAPSULE | Freq: Two times a day (BID) | ORAL | Status: AC
  Administered 2022-05-30 – 2022-06-05 (×14): 500 mg via ORAL
  Filled 2022-05-30 (×14): qty 1

## 2022-05-30 MED ORDER — ONDANSETRON HCL 4 MG/2ML IJ SOLN
4.0000 mg | Freq: Once | INTRAMUSCULAR | Status: AC
  Administered 2022-05-30: 4 mg via INTRAVENOUS
  Filled 2022-05-30: qty 2

## 2022-05-30 MED ORDER — ALUM & MAG HYDROXIDE-SIMETH 200-200-20 MG/5ML PO SUSP
30.0000 mL | Freq: Once | ORAL | Status: AC
  Administered 2022-05-30: 30 mL via ORAL
  Filled 2022-05-30: qty 30

## 2022-05-30 MED ORDER — LORAZEPAM 2 MG PO TABS
0.0000 mg | ORAL_TABLET | Freq: Four times a day (QID) | ORAL | Status: AC
  Administered 2022-05-30: 2 mg via ORAL
  Administered 2022-05-31 (×2): 1 mg via ORAL
  Administered 2022-06-01: 2 mg via ORAL
  Administered 2022-06-01 (×2): 1 mg via ORAL
  Filled 2022-05-30 (×7): qty 1

## 2022-05-30 MED ORDER — LORAZEPAM 2 MG/ML IJ SOLN: 0.0000 mg | Freq: Four times a day (QID) | INTRAMUSCULAR | Status: AC

## 2022-05-30 MED ORDER — LORAZEPAM 1 MG PO TABS
1.0000 mg | ORAL_TABLET | Freq: Once | ORAL | Status: AC
  Administered 2022-05-30: 1 mg via ORAL
  Filled 2022-05-30: qty 1

## 2022-05-30 NOTE — ED Notes (Signed)
VOl pending admit to [facility]  see note

## 2022-05-30 NOTE — ED Notes (Addendum)
Patient called out to state he was not feeling well, felt short of breath and nauseated. Patient visibly looks the same as previously assessed. Patient's vitals WNL. Dr. Tamala Julian notified and in to see patient.

## 2022-05-30 NOTE — ED Notes (Signed)
Pt and bed changed at this time.

## 2022-05-30 NOTE — ED Notes (Signed)
Pt pulled up in bed so that he could be seated in a more upright position to eat. [name] tea placed in another cup with a lid and straw

## 2022-05-30 NOTE — Progress Notes (Signed)
Occupational Therapy Treatment Patient Details Name: Douglas Peterson. MRN: [id number] DOB: 1951/07/14 Today's Date: 05/30/2022   History of present illness Douglas Peterson. is a 71 y.o. 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  Per paperwork DSS went for a well check to see the patient found his house in disarray multiple beer cans and was placed under IVC due to inability to care for himself.  Patient does admit to drinking alcohol.  He says that he is "sick" complains of abdominal pain.  Says that DSS broke into his house.  He lives alone denies suicidal ideation.   OT comments  [name] was agitated and upset during today's session, unhappy that he had been "brought to the hospital against my will." He displays b/l hand tremors and his balance is more fair-poor. He requires Min A for UB dressing in standing, Max A for LB dressing, Max for for toileting. Provided therapeutic listening, educ re: alcohol harm reduction strategies. RN notified re: pt's anxiety/agitation and provided medication during session. Will continue to follow with OT during hospitalization, with HHOT recommended and pt stating that he would welcome this service.   Recommendations for follow up therapy are one component of a multi-disciplinary discharge planning process, led by the attending physician.  Recommendations may be updated based on patient status, additional functional criteria and insurance authorization.    Follow Up Recommendations  Home health OT    Assistance Recommended at Discharge Intermittent Supervision/Assistance  Patient can return home with the following  A little help with walking and/or transfers;A little help with bathing/dressing/bathroom;Assistance with cooking/housework;Direct supervision/assist for medications management;Assist for transportation;Help with stairs or ramp for entrance   Equipment Recommendations   Other (comment) [facility])    Recommendations for Other Services      Precautions / Restrictions Precautions Precautions: Fall Type of Shoulder Precautions: hx of L clavicle fx 03/18/22 Restrictions Weight Bearing Restrictions: No       Mobility Bed Mobility Overal bed mobility: Needs Assistance Bed Mobility: Supine to Sit, Sit to Supine     Supine to sit: Min assist Sit to supine: Mod assist   General bed mobility comments: LE management returning to bed    Transfers Overall transfer level: Needs assistance Equipment used: Straight cane Transfers: Sit to/from Stand Sit to Stand: Min assist                 Balance Overall balance assessment: Needs assistance Sitting-balance support: No upper extremity supported, Feet unsupported Sitting balance-Leahy Scale: Fair Sitting balance - Comments: cueing for safety on high ED bed w/ feet unsupported   Standing balance support: Single extremity supported Standing balance-Leahy Scale: Fair Standing balance comment: Fair balance with SPC and static standing, poor balance while changing hospital gown in standing                           ADL either performed or assessed with clinical judgement   ADL Overall ADL's : Needs assistance/impaired                 Upper Body Dressing : Minimal assistance Upper Body Dressing Details (indicate cue type and reason): for donning hospital gown Lower Body Dressing: Maximal assistance Lower Body Dressing Details (indicate cue type and reason): for donning socks Toilet Transfer: Maximal assistance   Toileting- Clothing Manipulation and Hygiene: Maximal assistance  Extremity/Trunk Assessment Upper Extremity Assessment Upper Extremity Assessment: Generalized weakness RUE Deficits / Details: R UE contracture   Lower Extremity Assessment Lower Extremity Assessment: Generalized weakness        Vision       Perception     Praxis      Cognition  Arousal/Alertness: Awake/alert Behavior During Therapy: Restless, Agitated, Anxious, WFL for tasks assessed/performed Overall Cognitive Status: Within Functional Limits for tasks assessed                                 General Comments: Very agitated, upset, trembling today. RN gives Ativan during session.        Exercises Other Exercises Other Exercises: Educ re: reducing/eliminating ETOH use, community resources, PoC, DC recs    Shoulder Instructions       General Comments      Pertinent Vitals/ Pain       Pain Assessment Faces Pain Scale: No hurt  Home Living                                          Prior Functioning/Environment              Frequency  Min 2X/week        Progress Toward Goals  OT Goals(current goals can now be found in the care plan section)  Progress towards OT goals: Progressing toward goals  Acute Rehab OT Goals OT Goal Formulation: With patient Time For Goal Achievement: 06/11/22 Potential to Achieve Goals: Good  Plan Discharge plan remains appropriate;Frequency remains appropriate    Co-evaluation                 AM-PAC OT "6 Clicks" Daily Activity     Outcome Measure   Help from another person eating meals?: None Help from another person taking care of personal grooming?: A Little Help from another person toileting, which includes using toliet, bedpan, or urinal?: A Little Help from another person bathing (including washing, rinsing, drying)?: A Little Help from another person to put on and taking off regular upper body clothing?: A Little Help from another person to put on and taking off regular lower body clothing?: A Lot 6 Click Score: 18    End of Session Equipment Utilized During Treatment: Other (comment) (SPC)  OT Visit Diagnosis: Muscle weakness (generalized) (M62.81);Unsteadiness on feet (R26.81) Hemiplegia - Right/Left: Right   Activity Tolerance Patient tolerated  treatment well   Patient Left in bed;with call bell/phone within reach   Nurse Communication          Time: 5638-9373 OT Time Calculation (min): 34 min  Charges: OT General Charges $OT Visit: 1 Visit OT Treatments $Self Care/Home Management : 23-37 mins Josiah Lobo, PhD, MS, OTR/L 05/30/22, 4:41 PM

## 2022-05-30 NOTE — ED Notes (Signed)
Occ health at bedside, wants to get the pt up and to see if he is able to walk with his cane

## 2022-05-30 NOTE — ED Notes (Signed)
Pt ate all his dinner, remote found for pt

## 2022-05-30 NOTE — TOC Progression Note (Signed)
Transition of Care (TOC) - Progression Note    Patient Details  Name: Timber Lucarelli. MRN: [id number] Date of Birth: 1951-01-29  Transition of Care [facility]) CM/SW Contact  Shelbie Hutching, RN Phone Number: 05/30/2022, 9:44 AM  Clinical Narrative:    Spoke with Warner Mccreedy over at [facility]- they have received the clinical information.  She will be sending a nurse to assess him for placement at [facility].     Expected Discharge Plan: Assisted Living Barriers to Discharge: Other (must enter comment) (ALF placement)  Expected Discharge Plan and Services Expected Discharge Plan: Assisted Living   Discharge Planning Services: CM Consult   Living arrangements for the past 2 months: Single Family Home                 DME Arranged: N/A DME Agency: NA       HH Arranged: PT, OT           Social Determinants of Health (SDOH) Interventions    Readmission Risk Interventions    04/29/2022   10:26 AM 03/14/2022   12:48 PM 08/23/2020   11:52 AM  Readmission Risk Prevention Plan  Transportation Screening Complete Complete Complete  PCP or Specialist Appt within 3-5 Days  Complete   HRI or [place] Complete Complete   Social Work Consult for [place] Planning/Counseling Complete Complete   Palliative Care Screening Not Applicable Not Applicable   Medication Review Press photographer) Complete Complete Complete  HRI or [place]   Not Applicable

## 2022-05-30 NOTE — ED Notes (Signed)
Pt was changed into new brief. Pericare was done. Pt was placed in a new gown and new male purwick device.

## 2022-05-30 NOTE — ED Notes (Signed)
PT given breakfast.

## 2022-05-30 NOTE — ED Notes (Signed)
Discussion regarding the pt's answers for his ciwaa had with dr Starleen Blue, pt's vital signs have improved from his last dose of ativan at 1419, ativan is ordered every 12 hours at this time and no additional order given at this time

## 2022-05-30 NOTE — ED Notes (Signed)
Pt given nighttime snack.

## 2022-05-30 NOTE — ED Provider Notes (Signed)
-----------------------------------------   6:43 AM on 05/30/2022 -----------------------------------------   Blood pressure (!) 158/103, pulse 79, temperature 98 F (36.7 C), temperature source Oral, resp. rate 20, height 5' 4"  (1.626 m), weight 59 kg, SpO2 96 %.  The patient is calm and cooperative at this time.  There have been no acute events since the last update.  Awaiting disposition plan from Social Work team.   Paulette Blanch, MD 05/30/22 [phone]

## 2022-05-30 NOTE — ED Provider Notes (Signed)
Was informed by nurse that patient had developed some shortness of breath and epigastric discomfort and nausea after eating lunch.  He notes that sensation is worsening shortness of breath epigastric discomfort.  His belly is soft.  He is denying any chest pain or cough.  He reported he had a good bowel movement this morning.  He is doing mild acute symptoms.  His lungs are clear stable vitals with SPO2 of 96%, respiratory rate of 20 was 76.  I will obtain EKG and chest x-ray and basic labs and trial some Zofran and Maalox to see if this helps.  EKG shows sinus rhythm with a ventricular rate of 87, normal axis, artifact in lead I and lead II without any other clear evidence of acute ischemia or significant arrhythmia.  Troponin obtained is nonelevated 4.  Chest reviewed by myself shows no focal consoidation, effusion, edema, pneumothorax or other clear acute thoracic process. I also reviewed radiology interpretation and agree with findings described.  CBC without leukocytosis or acute anemia.  CMP without any significant acute electrolyte or metabolic derangements.  Lipase WNL not suggestive of pancreatitis.  UA is positive for cystitis with positive nitrites and many bacteria.  Urine culture sent.  Will order 7-day course of Keflex.  This could certainly be contributing some of his abdominal symptoms and nausea although I suspect he may have had some reflux as well.  Repeat troponin is 3.  At this point think patient is medically cleared and we will switch him back to being ED border.   Lucrezia Starch, MD 05/30/22 [phone]

## 2022-05-30 NOTE — ED Notes (Signed)
[name] bed changed and depends changed

## 2022-05-30 NOTE — ED Notes (Signed)
PPD placed to left arm is negative (0 mm) read by this RN.

## 2022-05-31 ENCOUNTER — Emergency Department: Payer: Medicare HMO

## 2022-05-31 LAB — SARS CORONAVIRUS 2 BY RT PCR: SARS Coronavirus 2 by RT PCR: NEGATIVE

## 2022-05-31 MED ORDER — ATENOLOL 25 MG PO TABS
25.0000 mg | ORAL_TABLET | Freq: Every day | ORAL | Status: DC
  Administered 2022-05-31 – 2022-06-06 (×7): 25 mg via ORAL
  Filled 2022-05-31 (×7): qty 1

## 2022-05-31 MED ORDER — TIZANIDINE HCL 2 MG PO TABS: 4.0000 mg | ORAL_TABLET | Freq: Three times a day (TID) | ORAL | Status: DC | PRN

## 2022-05-31 NOTE — ED Notes (Signed)
Notified by dawn, rn that pt fell, md notified and in to assess. This rn and md assisted pt to standing position and back into bed, one on one sitter ordered from md. Nursing supervisor notified will need one on one sitter.

## 2022-05-31 NOTE — ED Notes (Addendum)
Called to room by Lattie Haw, ED tech.  Patient found in floor at bottom of stretcher with both side rails still up.  Patient reports he crawled out of the bed and was trying to get a more comfortable bed  Reminded the patient that he was in the emergency room and was not being discharged at this time.  Assisted patient back in to behavior bed that can be lower to the floor for safety.  Charge nurse (April, RN) and Dr Beather Arbour notified.

## 2022-05-31 NOTE — ED Notes (Signed)
Pt given breakfast tray

## 2022-05-31 NOTE — ED Notes (Addendum)
Upon doing 15 min checks, pt was found on the floor. Stated "the bed was too uncomfortable".  RN notified.  Pt placed in a behavioral bed.

## 2022-05-31 NOTE — ED Notes (Signed)
1:1 safety sitter at bedside

## 2022-05-31 NOTE — ED Provider Notes (Signed)
-----------------------------------------   1:37 AM on 05/31/2022 -----------------------------------------   Blood pressure (!) 140/98, pulse 92, temperature 98.3 F (36.8 C), temperature source Oral, resp. rate 17, height 5' 4"  (1.626 m), weight 59 kg, SpO2 95 %.  Patient fell, found laying on the floor on his left side.  Denies striking head or LOC.  Voices no complaints.  Will obtain pelvis x-ray.  1: 1 sitter ordered.    ----------------------------------------- 4:09 AM on 05/31/2022 -----------------------------------------   Negative pelvis x-rays.   ----------------------------------------- 5:34 AM on 05/31/2022 -----------------------------------------   No further events.  Patient awaiting disposition from social work team.   Paulette Blanch, MD 05/31/22 [phone]

## 2022-05-31 NOTE — ED Notes (Signed)
[name] vol pending placement to [facility]

## 2022-06-01 LAB — CBC
HCT: 43.4 % (ref 39.0–52.0)
Hemoglobin: 14.3 g/dL (ref 13.0–17.0)
MCH: 30.4 pg (ref 26.0–34.0)
MCHC: 32.9 g/dL (ref 30.0–36.0)
MCV: 92.1 fL (ref 80.0–100.0)
Platelets: 263 10*3/uL (ref 150–400)
RBC: 4.71 MIL/uL (ref 4.22–5.81)
RDW: 12 % (ref 11.5–15.5)
WBC: 7.1 10*3/uL (ref 4.0–10.5)
nRBC: 0 % (ref 0.0–0.2)

## 2022-06-01 LAB — URINE CULTURE

## 2022-06-01 LAB — CREATININE, SERUM
Creatinine, Ser: 0.87 mg/dL (ref 0.61–1.24)
GFR, Estimated: >60 mL/min (ref >60)

## 2022-06-01 MED ORDER — POLYETHYLENE GLYCOL 3350 17 G PO PACK
17.0000 g | PACK | Freq: Once | ORAL | Status: AC
Start: 2022-06-01 — End: 2022-06-01
  Administered 2022-06-01: 17 g via ORAL
  Filled 2022-06-01: qty 1

## 2022-06-01 MED ORDER — ACETAMINOPHEN 325 MG PO TABS
650.0000 mg | ORAL_TABLET | Freq: Once | ORAL | Status: AC
Start: 2022-06-01 — End: 2022-06-01
  Administered 2022-06-01: 650 mg via ORAL
  Filled 2022-06-01: qty 2

## 2022-06-01 MED ORDER — ENOXAPARIN SODIUM 40 MG/0.4ML IJ SOSY
40.0000 mg | PREFILLED_SYRINGE | INTRAMUSCULAR | Status: DC
Start: 2022-06-01 — End: 2022-06-06
  Administered 2022-06-01 – 2022-06-05 (×5): 40 mg via SUBCUTANEOUS
  Filled 2022-06-01 (×5): qty 0.4

## 2022-06-01 NOTE — ED Notes (Signed)
Report received from [facility]

## 2022-06-01 NOTE — ED Notes (Signed)
Report given to Heather, RN

## 2022-06-01 NOTE — ED Provider Notes (Signed)
Today's Vitals   05/30/22 1930 05/30/22 1956 05/31/22 0145 05/31/22 1721  BP: (!) 143/107 (!) 140/98 (!) 149/93 (!) 140/98  Pulse: 82 92  97  Resp: 18 17 19 18   Temp:  98.3 F (36.8 C) 97.6 F (36.4 C)   TempSrc:  Oral Oral   SpO2: 96% 95% 97% 98%  Weight:      Height:      PainSc:   8     Body mass index is 22.31 kg/m.  No acute events.  Awaiting social work disposition.   , Delice Bison, DO 06/01/22 [phone]

## 2022-06-01 NOTE — ED Notes (Signed)
VOL / pending TOC placement

## 2022-06-02 MED ORDER — BACLOFEN 10 MG PO TABS
10.0000 mg | ORAL_TABLET | Freq: Three times a day (TID) | ORAL | Status: DC | PRN
  Administered 2022-06-02 – 2022-06-05 (×8): 10 mg via ORAL
  Filled 2022-06-02 (×12): qty 1

## 2022-06-02 NOTE — TOC Progression Note (Signed)
Transition of Care (TOC) - Progression Note    Patient Details  Name: Douglas Peterson. MRN: [id number] Date of Birth: 10-13-1951  Transition of Care [facility]) CM/SW Contact  Shelbie Hutching, RN Phone Number: 06/02/2022, 1:32 PM  Clinical Narrative:    [place] has not responded since last week.  RNCM reached out to Guardian with [name], she reports that an apartment has become available at [facility] and patient should be able to move in later this week.  Freda Munro the Director of Nursing will be coming tomorrow to complete an assessment on patient.  RNCM has [employer] emailed the clinicals and FL2 to [facility] for review today.     Expected Discharge Plan: Assisted Living Barriers to Discharge: Other (must enter comment) (ALF placement)  Expected Discharge Plan and Services Expected Discharge Plan: Assisted Living   Discharge Planning Services: CM Consult   Living arrangements for the past 2 months: Single Family Home                 DME Arranged: N/A DME Agency: NA       HH Arranged: PT, OT           Social Determinants of Health (SDOH) Interventions    Readmission Risk Interventions    04/29/2022   10:26 AM 03/14/2022   12:48 PM 08/23/2020   11:52 AM  Readmission Risk Prevention Plan  Transportation Screening Complete Complete Complete  PCP or Specialist Appt within 3-5 Days  Complete   HRI or [place] Complete Complete   Social Work Consult for [place] Planning/Counseling Complete Complete   Palliative Care Screening Not Applicable Not Applicable   Medication Review Press photographer) Complete Complete Complete  HRI or [place]   Not Applicable

## 2022-06-02 NOTE — ED Notes (Signed)
Pt given menu and phone to call dietary

## 2022-06-02 NOTE — Progress Notes (Signed)
Physical Therapy Treatment Patient Details Name: Douglas Peterson. MRN: [id number] DOB: 04/15/1951 Today's Date: 06/02/2022   History of Present Illness Douglas Peterson. is a 71 y.o. 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  Per paperwork DSS went for a well check to see the patient found his house in disarray multiple beer cans and was placed under IVC due to inability to care for himself.  Patient does admit to drinking alcohol.  He says that he is "sick" complains of abdominal pain.  Says that DSS broke into his house.  He lives alone denies suicidal ideation.    PT Comments    Pt in bed, agreeable to session. Reports he has been in the ED bed all weekend, NSG has decline his requests to assist with him AMB on the unit. He asked to brush his teeth today, setup items were brought in but he was not allowed to stand to teeth brushing. Pt AMB around the ED 600+feet, no device, but uses author's left hand for HHA. Pt also in double socks as his shoes are not here and has chronic foot pain. Author brought in a recliner to set up patient in chair at EOS, coordinated with NSG to allow for use of urinal instead of purewick and diaper. Also coordinated mobility techs in the care of this patient. Informed NSG of soiled bed linen and pee can.      Recommendations for follow up therapy are one component of a multi-disciplinary discharge planning process, led by the attending physician.  Recommendations may be updated based on patient status, additional functional criteria and insurance authorization.  Follow Up Recommendations  Long-term institutional care without follow-up therapy Can patient physically be transported by private vehicle: Yes   Assistance Recommended at Discharge Intermittent Supervision/Assistance  Patient can return home with the following A little help with bathing/dressing/bathroom;Assist for  transportation;Help with stairs or ramp for entrance   Equipment Recommendations  None recommended by PT    Recommendations for Other Services       Precautions / Restrictions Precautions Precautions: Fall     Mobility  Bed Mobility Overal bed mobility: Modified Independent Bed Mobility: Supine to Sit     Supine to sit: Modified independent (Device/Increase time)          Transfers Overall transfer level: Needs assistance Equipment used: None Transfers: Sit to/from Stand Sit to Stand: Supervision Stand pivot transfers: Supervision              Ambulation/Gait Ambulation/Gait assistance: Min assist, Min guard Gait Distance (Feet): 600 Feet Assistive device: None, 1 person hand held assist   Gait velocity: dedecreased     General Gait Details: no SPC available; uses authors hand for HHA; 2 LOB that requires minA, otherwise consistent pace and ability   Stairs             Wheelchair Mobility    Modified Rankin (Stroke Patients Only)       Balance                                            Cognition  Exercises      General Comments        Pertinent Vitals/Pain Pain Assessment Pain Assessment: No/denies pain    Home Living                          Prior Function            PT Goals (current goals can now be found in the care plan section) Acute Rehab PT Goals Patient Stated Goal: never come back to the hospital again PT Goal Formulation: With patient Time For Goal Achievement: 06/11/22 Potential to Achieve Goals: Good Progress towards PT goals: Progressing toward goals    Frequency    Min 2X/week      PT Plan Current plan remains appropriate    Co-evaluation              AM-PAC PT "6 Clicks" Mobility   Outcome Measure  Help needed turning from your back to your side while in a flat bed without using bedrails?: A  Little Help needed moving from lying on your back to sitting on the side of a flat bed without using bedrails?: A Little Help needed moving to and from a bed to a chair (including a wheelchair)?: A Little Help needed standing up from a chair using your arms (e.g., wheelchair or bedside chair)?: A Little Help needed to walk in hospital room?: A Little Help needed climbing 3-5 steps with a railing? : A Lot 6 Click Score: 17    End of Session Equipment Utilized During Treatment: Gait belt Activity Tolerance: Patient tolerated treatment well Patient left: in chair;with call bell/phone within reach;with chair alarm set Nurse Communication: Mobility status PT Visit Diagnosis: Unsteadiness on feet (R26.81);Muscle weakness (generalized) (M62.81);Repeated falls (R29.6);Difficulty in walking, not elsewhere classified (R26.2);Other abnormalities of gait and mobility (R26.89)     Time: 7564-3329 PT Time Calculation (min) (ACUTE ONLY): 40 min  Charges:  $Therapeutic Activity: 38-52 mins                    5:06 PM, 06/02/22 Etta Grandchild, PT, DPT Physical Therapist - [facility]  [phone] ([place])     , C 06/02/2022, 5:02 PM

## 2022-06-02 NOTE — ED Notes (Signed)
Pt on bedpan and had large BM. Pt cleaned. Clean brief and chux in place.

## 2022-06-03 NOTE — ED Notes (Signed)
VOL/Pending Placement

## 2022-06-03 NOTE — ED Notes (Signed)
VOL PENDING  PLACEMENT

## 2022-06-03 NOTE — ED Provider Notes (Signed)
Today's Vitals   06/03/22 0130 06/03/22 0210 06/03/22 0429 06/03/22 0629  BP:      Pulse:      Resp:      Temp:      TempSrc:      SpO2:      Weight:      Height:      PainSc: Asleep Asleep Asleep Asleep   Body mass index is 22.31 kg/m.   No acute events overnight.  Awaiting social work disposition.   , Delice Bison, DO 06/03/22 [phone]

## 2022-06-03 NOTE — Progress Notes (Signed)
Occupational Therapy Treatment Patient Details Name: Douglas Peterson. MRN: [id number] DOB: 11/18/1950 Today's Date: 06/03/2022   History of present illness Devyon Keator. is a 71 y.o. 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  Per paperwork DSS went for a well check to see the patient found his house in disarray multiple beer cans and was placed under IVC due to inability to care for himself.  Patient does admit to drinking alcohol.  He says that he is "sick" complains of abdominal pain.  Says that DSS broke into his house.  He lives alone denies suicidal ideation.   OT comments  Upon entering the room, pt supine in bed and agreeable to OT intervention. Pt does report stomach pain but motivated for activity this session. [name] hospital gown is soiled with food and pt sits on EOB with increased time to perform task. Pt needing min A to don clean hospital gown. Pt stands with supervision and min guard to ambulate to sink for grooming tasks to brush teeth, comb hair, and wash face with all needed items placed within reach. Pt ambulates 600' with min guard- min HHA without any significant LOB. Pt returning to room and seated in recliner chair with RN notified. All needs within reach.    Recommendations for follow up therapy are one component of a multi-disciplinary discharge planning process, led by the attending physician.  Recommendations may be updated based on patient status, additional functional criteria and insurance authorization.    Follow Up Recommendations  Home health OT    Assistance Recommended at Discharge Intermittent Supervision/Assistance  Patient can return home with the following  A little help with walking and/or transfers;A little help with bathing/dressing/bathroom;Assistance with cooking/housework;Direct supervision/assist for medications management;Assist for transportation;Help with stairs or ramp  for entrance   Equipment Recommendations  None recommended by OT       Precautions / Restrictions Precautions Precautions: Fall Type of Shoulder Precautions: hx of L clavicle fx 03/18/22 Restrictions Weight Bearing Restrictions: No       Mobility Bed Mobility Overal bed mobility: Modified Independent       Supine to sit: Modified independent (Device/Increase time)     General bed mobility comments: no physical assistance but pt needing increased time to complete task    Transfers Overall transfer level: Needs assistance Equipment used: None Transfers: Sit to/from Stand, Bed to chair/wheelchair/BSC Sit to Stand: Supervision Stand pivot transfers: Supervision, Min guard               Balance Overall balance assessment: Needs assistance Sitting-balance support: No upper extremity supported, Feet unsupported Sitting balance-Leahy Scale: Good     Standing balance support: Single extremity supported Standing balance-Leahy Scale: Fair                             ADL either performed or assessed with clinical judgement   ADL Overall ADL's : Needs assistance/impaired     Grooming: Wash/dry hands;Wash/dry face;Oral care;Standing;Set up;Min guard           Upper Body Dressing : Minimal assistance Upper Body Dressing Details (indicate cue type and reason): for donning hospital gown                        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  Vision Patient Visual Report: No change from baseline            Cognition Arousal/Alertness: Awake/alert Behavior During Therapy: WFL for tasks assessed/performed Overall Cognitive Status: Within Functional Limits for tasks assessed                         Following Commands: Follows one step commands with increased time                            Pertinent Vitals/ Pain       Pain Assessment Pain Assessment: Faces Faces Pain Scale: Hurts a little bit Pain Location: Nausea and stomach pain Pain Descriptors / Indicators: Aching Pain Intervention(s): Monitored during session, Repositioned, Patient requesting pain meds-RN notified         Frequency  Min 2X/week        Progress Toward Goals  OT Goals(current goals can now be found in the care plan section)  Progress towards OT goals: Progressing toward goals  Acute Rehab OT Goals Patient Stated Goal: to get stronger OT Goal Formulation: With patient Time For Goal Achievement: 06/11/22 Potential to Achieve Goals: Good  Plan Discharge plan remains appropriate;Frequency remains appropriate       AM-PAC OT "6 Clicks" Daily Activity     Outcome Measure   Help from another person eating meals?: None Help from another person taking care of personal grooming?: A Little Help from another person toileting, which includes using toliet, bedpan, or urinal?: A Little Help from another person bathing (including washing, rinsing, drying)?: A Little Help from another person to put on and taking off regular upper body clothing?: A Little Help from another person to put on and taking off regular lower body clothing?: A Lot 6 Click Score: 18    End of Session    OT Visit Diagnosis: Muscle weakness (generalized) (M62.81);Unsteadiness on feet (R26.81) Hemiplegia - Right/Left: Right   Activity Tolerance Patient tolerated treatment well   Patient Left with call bell/phone within reach;in chair   Nurse Communication Mobility status        Time: 1103-1594 OT Time Calculation (min): 25 min  Charges: OT General Charges $OT Visit: 1 Visit OT Treatments $Self Care/Home Management : 8-22 mins $Therapeutic Activity: 8-22 mins  Darleen Crocker, MS, OTR/L , CBIS ascom [phone]  06/03/22, 12:32 PM

## 2022-06-03 NOTE — ED Notes (Signed)
Pt up out of bed to chair with PT assistance. Pt ae breakfast and took medications without difficulty. Pt primafit removed on the patients request as he wants to use the urianal while he is up. Pad placed under patient for extra measures at keeping him dry.

## 2022-06-04 NOTE — Progress Notes (Addendum)
Mobility Specialist - Progress Note   06/04/22 1012  Mobility  Activity Ambulated with assistance in hallway  Level of Assistance Standby assist, set-up cues, supervision of patient - no hands on  Assistive Device Cane  Distance Ambulated (ft) 1800 ft  Activity Response Tolerated well  $Mobility charge 1 Mobility    Pt ambulated > 1000' with supervision and SPC. No LOB. No complaints.    Kathee Delton Mobility Specialist 06/04/22, 11:31 AM

## 2022-06-04 NOTE — Progress Notes (Signed)
Physical Therapy Treatment Patient Details Name: Shepherd Finnan. MRN: [id number] DOB: 1951-05-02 Today's Date: 06/04/2022   History of Present Illness Frederico Gerling. is a 71 y.o. 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  Per paperwork DSS went for a well check to see the patient found his house in disarray multiple beer cans and was placed under IVC due to inability to care for himself.  Patient does admit to drinking alcohol.  He says that he is "sick" complains of abdominal pain.  Says that DSS broke into his house.  He lives alone denies suicidal ideation.    PT Comments    Pt was sitting EOB upon arriving. He is well known by our department from previous admissions. Pt is A and O and agreeable to session. Seems to be close to baseline abilities. He was able to ambulate with SPC ~ 200 ft without CGA. Mobility tech arrived to ambulate pt more after PT session concluded. Pt has had many admissions of late and would benefit from LTC facility to maximize safety with ADLs. Pt is wanting to return home with hired caregivers.    Recommendations for follow up therapy are one component of a multi-disciplinary discharge planning process, led by the attending physician.  Recommendations may be updated based on patient status, additional functional criteria and insurance authorization.  Follow Up Recommendations  Long-term institutional care without follow-up therapy (pt has frequent admissions/re-admits)     Assistance Recommended at Discharge Intermittent Supervision/Assistance  Patient can return home with the following A little help with bathing/dressing/bathroom;Assist for transportation;Help with stairs or ramp for entrance   Equipment Recommendations  None recommended by PT       Precautions / Restrictions Precautions Precautions: Fall Type of Shoulder Precautions: hx of L clavicle fx  03/18/22 Restrictions Weight Bearing Restrictions: No     Mobility  Bed Mobility Overal bed mobility: Modified Independent   Transfers Overall transfer level: Needs assistance Equipment used: Straight cane Transfers: Sit to/from Stand Sit to Stand: Supervision    General transfer comment: pt stood without physical assistance.    Ambulation/Gait Ambulation/Gait assistance: Min guard Gait Distance (Feet): 200 Feet Assistive device: Straight cane Gait Pattern/deviations: Shuffle, Trunk flexed, Narrow base of support Gait velocity: decreased     General Gait Details: pt ambulated ~ 200 ft with SPC. seems to be close to baseline       Balance Overall balance assessment: Needs assistance Sitting-balance support: No upper extremity supported, Feet unsupported Sitting balance-Leahy Scale: Good     Standing balance support: Single extremity supported, During functional activity Standing balance-Leahy Scale: Fair Standing balance comment: Pt has some balance deficits at baseline however no intervention required with ambulation with SPC today       Cognition Arousal/Alertness: Awake/alert Behavior During Therapy: WFL for tasks assessed/performed Overall Cognitive Status: Within Functional Limits for tasks assessed      Following Commands: Follows one step commands with increased time Safety/Judgement: Decreased awareness of safety, Decreased awareness of deficits     General Comments: Pt is A and O x3 agreeable to session and very appreciative of department getting him loaner pair of shoes               Pertinent Vitals/Pain Pain Assessment Pain Assessment: No/denies pain     PT Goals (current goals can now be found in the care plan section) Acute Rehab PT Goals Patient Stated Goal: go home Progress towards PT  goals: Progressing toward goals    Frequency    Min 2X/week      PT Plan Current plan remains appropriate       AM-PAC PT "6 Clicks"  Mobility   Outcome Measure  Help needed turning from your back to your side while in a flat bed without using bedrails?: A Little Help needed moving from lying on your back to sitting on the side of a flat bed without using bedrails?: A Little Help needed moving to and from a bed to a chair (including a wheelchair)?: A Little Help needed standing up from a chair using your arms (e.g., wheelchair or bedside chair)?: A Little Help needed to walk in hospital room?: A Little Help needed climbing 3-5 steps with a railing? : A Little 6 Click Score: 18    End of Session   Activity Tolerance: Patient tolerated treatment well Patient left:  (seated EOB with mobility tech) Nurse Communication: Mobility status PT Visit Diagnosis: Unsteadiness on feet (R26.81);Muscle weakness (generalized) (M62.81);Repeated falls (R29.6);Difficulty in walking, not elsewhere classified (R26.2);Other abnormalities of gait and mobility (R26.89)     Time: 0912-0930 PT Time Calculation (min) (ACUTE ONLY): 18 min  Charges:  $Gait Training: 8-22 mins                    Julaine Fusi PTA 06/04/22, 9:54 AM

## 2022-06-04 NOTE — ED Notes (Signed)
Vol pending placement

## 2022-06-04 NOTE — TOC Progression Note (Signed)
Transition of Care (TOC) - Progression Note    Patient Details  Name: Douglas Peterson. MRN: [id number] Date of Birth: 03-Feb-1951  [facility] of [facility]) CM/SW Contact  Shelbie Hutching, RN Phone Number: 06/04/2022, 2:20 PM  Clinical Narrative:    RNCM spoke with [facility] this afternoon.  [facility] will be accepting patient to facility.  New patient admission paperwork needs to be completed with Toula Moos from [place] and his room needs to be furnished.  RNCM has messaged Ka[facility] for follow up, all clinicals have been faxed to [facility].     Expected Discharge Plan: Assisted Living Barriers to Discharge: Other (must enter comment) (ALF placement)  Expected Discharge Plan and Services Expected Discharge Plan: Assisted Living   Discharge Planning Services: CM Consult   Living arrangements for the past 2 months: Single Family Home                 DME Arranged: N/A DME Agency: NA       HH Arranged: PT, OT           Social Determinants of Health (SDOH) Interventions    Readmission Risk Interventions    04/29/2022   10:26 AM 03/14/2022   12:48 PM 08/23/2020   11:52 AM  Readmission Risk Prevention Plan  Transportation Screening Complete Complete Complete  PCP or Specialist Appt within 3-5 Days  Complete   HRI or [place] Complete Complete   Social Work Consult for [place] Planning/Counseling Complete Complete   Palliative Care Screening Not Applicable Not Applicable   Medication Review Press photographer) Complete Complete Complete  HRI or [place]   Not Applicable

## 2022-06-04 NOTE — Progress Notes (Signed)
Mobility Specialist - Progress Note    06/04/22 1500  Mobility  Activity Ambulated with assistance in hallway  Level of Assistance Contact guard assist, steadying assist  Assistive Device Cane  Distance Ambulated (ft) 1200 ft  Activity Response Tolerated well  $Mobility charge 1 Mobility    Pre-mobility: HR, BP, SpO2 During mobility: HR, BP, SpO2 Post-mobility: HR, BP, SPO2  Pt lying in bed upon arrival using RA. Completes bed mobility ModI + extra time and STS with MinA. Ambulates CGA for 128f voicing no complaints and returns to bed with needs in reach. RN notified of return.  MMerrily BrittleMobility Specialist 06/04/22, 3:51 PM

## 2022-06-04 NOTE — ED Notes (Signed)
Vol /pending placement

## 2022-06-04 NOTE — ED Provider Notes (Signed)
Emergency Medicine Observation Re-evaluation Note  Douglas Peterson. is a 71 y.o. male, seen in the emergency department for various complaints now awaiting placement to an appropriate living facility.  No acute events since last update  Physical Exam  BP 132/89 (BP Location: Left Arm)   Pulse 62   Temp 98 F (36.7 C) (Oral)   Resp 18   Ht 5' 4"  (1.626 m)   Wt 59 kg   SpO2 98%   BMI 22.31 kg/m   [name] sitting in his bed, watching videos on his phone, no distress.  ED Course / MDM   Lab work from 7/30 reviewed showing a normal renal function as well as normal CBC.  Plan  Current plan is for placement to an appropriate living facility once available.  [name] is not under involuntary commitment.     Harvest Dark, MD 06/04/22 1556

## 2022-06-04 NOTE — ED Notes (Signed)
Patient sitting on side of bed. RN assisted patient with breakfast tray set up.

## 2022-06-05 MED ORDER — DOCUSATE SODIUM 100 MG PO CAPS
100.0000 mg | ORAL_CAPSULE | Freq: Once | ORAL | Status: AC
Start: 2022-06-05 — End: 2022-06-05
  Administered 2022-06-05: 100 mg via ORAL
  Filled 2022-06-05: qty 1

## 2022-06-05 NOTE — ED Notes (Addendum)
Resumed care from [place] rn.  Pt sleeping.   Pt in hallway bed.

## 2022-06-05 NOTE — Progress Notes (Signed)
PT Cancellation Note  Patient Details Name: Douglas Peterson. MRN: [id number] DOB: 11-28-1950   Cancelled Treatment:     Pt was sound asleep upon arriving and requested author return later this afternoon. PT will continue to follow and progress as able per current POC.     Willette Pa 06/05/2022, 11:44 AM

## 2022-06-05 NOTE — ED Notes (Signed)
Patient resting in hospital bed at this time. Respirations even and unlabored. NAD noted.

## 2022-06-05 NOTE — ED Notes (Signed)
Breakfast tray at bedside 

## 2022-06-05 NOTE — ED Notes (Signed)
Vol pending placement see most recent note on chart

## 2022-06-05 NOTE — TOC Progression Note (Signed)
Transition of Care (TOC) - Progression Note    Patient Details  Name: Douglas Peterson. MRN: [id number] Date of Birth: 09/09/1951  Transition of Care [facility]) CM/SW Contact  Shelbie Hutching, RN Phone Number: 06/05/2022, 12:21 PM  Clinical Narrative:    Per guardian, Toula Moos, she will be working on getting the apartment at [facility] furnished and tentative move in Friday.     Expected Discharge Plan: Assisted Living Barriers to Discharge: Other (must enter comment) (ALF placement)  Expected Discharge Plan and Services Expected Discharge Plan: Assisted Living   Discharge Planning Services: CM Consult   Living arrangements for the past 2 months: Single Family Home                 DME Arranged: N/A DME Agency: NA       HH Arranged: PT, OT           Social Determinants of Health (SDOH) Interventions    Readmission Risk Interventions    04/29/2022   10:26 AM 03/14/2022   12:48 PM 08/23/2020   11:52 AM  Readmission Risk Prevention Plan  Transportation Screening Complete Complete Complete  PCP or Specialist Appt within 3-5 Days  Complete   HRI or [place] Complete Complete   Social Work Consult for [place] Planning/Counseling Complete Complete   Palliative Care Screening Not Applicable Not Applicable   Medication Review Press photographer) Complete Complete Complete  HRI or [place]   Not Applicable

## 2022-06-05 NOTE — Progress Notes (Signed)
Mobility Specialist - Progress Note   06/05/22 1500  Mobility  Activity Ambulated with assistance in hallway  Level of Assistance Contact guard assist, steadying assist  Assistive Device Cane  Distance Ambulated (ft) 1800 ft  Activity Response Tolerated well  $Mobility charge 1 Mobility     Pt lying in bed upon arrival, utilizing RA. Pt motivated for activity. Ambulated in hallway with CGA, 3 LOB with 1 being significant, recovered by CGA. No complaints. Pt left in bed with needs in reach.    Kathee Delton Mobility Specialist 06/05/22, 3:32 PM

## 2022-06-06 ENCOUNTER — Telehealth: Payer: Self-pay | Admitting: Emergency Medicine

## 2022-06-06 MED ORDER — DOCUSATE SODIUM 100 MG PO CAPS
100.0000 mg | ORAL_CAPSULE | Freq: Once | ORAL | Status: AC
  Administered 2022-06-06: 100 mg via ORAL
  Filled 2022-06-06: qty 1

## 2022-06-06 MED ORDER — ATENOLOL 25 MG PO TABS: 25.0000 mg | ORAL_TABLET | Freq: Every day | ORAL | 0 refills | Status: DC

## 2022-06-06 MED ORDER — POLYETHYLENE GLYCOL 3350 17 G PO PACK
17.0000 g | PACK | Freq: Every day | ORAL | Status: DC
Start: 2022-06-06 — End: 2022-06-06
  Administered 2022-06-06: 17 g via ORAL
  Filled 2022-06-06: qty 1

## 2022-06-06 MED ORDER — BACLOFEN 10 MG PO TABS: 10.0000 mg | ORAL_TABLET | Freq: Three times a day (TID) | ORAL | 1 refills | Status: AC | PRN

## 2022-06-06 MED ORDER — BACLOFEN 10 MG PO TABS: 10.0000 mg | ORAL_TABLET | Freq: Three times a day (TID) | ORAL | 0 refills | Status: AC | PRN

## 2022-06-06 NOTE — Telephone Encounter (Signed)
Med update

## 2022-06-06 NOTE — Progress Notes (Signed)
Physical Therapy Treatment Patient Details Name: Douglas Peterson. MRN: [id number] DOB: 08/20/1951 Today's Date: 06/06/2022   History of Present Illness Douglas Peterson. is a 71 y.o. 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  Per paperwork DSS went for a well check to see the patient found his house in disarray multiple beer cans and was placed under IVC due to inability to care for himself.  Patient does admit to drinking alcohol.  He says that he is "sick" complains of abdominal pain.  Says that DSS broke into his house.  He lives alone denies suicidal ideation.    PT Comments    Pt was sitting EOB working with OT upon arriving. He is A and O x 4 and agreeable to session. Pt continues to be motivated and cooperative throughout. Seems to be getting close to baseline with mobility, transfers, and gait. Will benefit from continued PT to address ROM and balance deficits.    Recommendations for follow up therapy are one component of a multi-disciplinary discharge planning process, led by the attending physician.  Recommendations may be updated based on patient status, additional functional criteria and insurance authorization.  Follow Up Recommendations  Long-term institutional care without follow-up therapy (frequent admissions due to alcoholism)     Assistance Recommended at Discharge Intermittent Supervision/Assistance  Patient can return home with the following A little help with bathing/dressing/bathroom;Assist for transportation;Help with stairs or ramp for entrance   Equipment Recommendations  None recommended by PT       Precautions / Restrictions Precautions Precautions: Fall Type of Shoulder Precautions: hx of L clavicle fx 03/18/22 Restrictions Weight Bearing Restrictions: No RUE Weight Bearing: Weight bearing as tolerated LUE Weight Bearing: Weight bearing as tolerated     Mobility  Bed  Mobility Overal bed mobility: Modified Independent   Transfers Overall transfer level: Needs assistance Equipment used: Straight cane Transfers: Sit to/from Stand Sit to Stand: Supervision, Min guard    General transfer comment: CGA for safety on first attempt, supervision on 2nd    Ambulation/Gait Ambulation/Gait assistance: Min guard Gait Distance (Feet): 300 Feet Assistive device: Straight cane Gait Pattern/deviations: Shuffle, Trunk flexed, Narrow base of support Gait velocity: decreased     General Gait Details: pt seems to be close to baseline with ambulation and strength. will continue to benefit form acute PT to focus on ROM and balance    Balance Overall balance assessment: Needs assistance Sitting-balance support: No upper extremity supported, Feet unsupported Sitting balance-Leahy Scale: Good     Standing balance support: Single extremity supported, During functional activity Standing balance-Leahy Scale: Fair       Cognition Arousal/Alertness: Awake/alert Behavior During Therapy: WFL for tasks assessed/performed Overall Cognitive Status: Within Functional Limits for tasks assessed      Following Commands: Follows one step commands with increased time Safety/Judgement: Decreased awareness of safety, Decreased awareness of deficits                     Pertinent Vitals/Pain Pain Assessment Pain Assessment: No/denies pain Pain Score: 0-No pain     PT Goals (current goals can now be found in the care plan section) Acute Rehab PT Goals Patient Stated Goal: go home Progress towards PT goals: Progressing toward goals    Frequency    Min 2X/week      PT Plan Current plan remains appropriate       AM-PAC PT "6 Clicks" Mobility  Outcome Measure  Help needed turning from your back to your side while in a flat bed without using bedrails?: A Little Help needed moving from lying on your back to sitting on the side of a flat bed without using  bedrails?: A Little Help needed moving to and from a bed to a chair (including a wheelchair)?: A Little Help needed standing up from a chair using your arms (e.g., wheelchair or bedside chair)?: A Little Help needed to walk in hospital room?: A Little Help needed climbing 3-5 steps with a railing? : A Little 6 Click Score: 18    End of Session   Activity Tolerance: Patient tolerated treatment well Patient left: in bed;with call bell/phone within reach;with nursing/sitter in room;with bed alarm set Nurse Communication: Mobility status PT Visit Diagnosis: Unsteadiness on feet (R26.81);Muscle weakness (generalized) (M62.81);Repeated falls (R29.6);Difficulty in walking, not elsewhere classified (R26.2);Other abnormalities of gait and mobility (R26.89)     Time: 1024-1040 PT Time Calculation (min) (ACUTE ONLY): 16 min  Charges:  $Gait Training: 8-22 mins                     Julaine Fusi PTA 06/06/22, 11:38 AM

## 2022-06-06 NOTE — ED Notes (Signed)
Patient resting in bed free from sign of distress. Breathing unlabored speaking in full sentences with symmetric chest rise and fall. Bed low and locked with side rails raised x3. Call bell in reach and pt in view of nurses station.

## 2022-06-06 NOTE — ED Notes (Signed)
Pt back to bed via wheelchair from toilet

## 2022-06-06 NOTE — Progress Notes (Signed)
Mobility Specialist - Progress Note    06/06/22 1001  Mobility  Activity Ambulated with assistance in hallway;Stood at bedside;Dangled on edge of bed  Level of Assistance Contact guard assist, steadying assist  Assistive Device Four point cane  Distance Ambulated (ft) 600 ft  Activity Response Tolerated well  $Mobility charge 1 Mobility    Pt supine in bed on RA upon arrival. Pt ambulates in hallway with CGA, 1 LOB recovered by CGA. Pt left in bed with needs in reach.   Douglas Peterson  Mobility Specialist  06/06/22 10:04 AM

## 2022-06-06 NOTE — TOC Transition Note (Signed)
Transition of Care [facility]) - CM/SW Discharge Note   Patient Details  Name: Douglas Peterson. MRN: [id number] Date of Birth: [birth year]  [facility] CM/SW Contact:  Shelbie Hutching, RN Phone Number: 06/06/2022, 11:42 AM   Clinical Narrative:    Patient will discharge this afternoon.  Confirmed with legal guardian Toula Moos that [facility].  His furniture is going to be delivered between 4pm and 7 pm, Barrie Lyme will pick patient up and transport him to the facility once furniture gets there.    Final next level of care: Assisted Living Barriers to Discharge: Barriers Resolved   Patient Goals and CMS Choice Patient states their goals for this hospitalization and ongoing recovery are:: patient under [place] DSS guardianship CMS Medicare.gov Compare Post Acute Care list provided to:: Legal Guardian Choice offered to / list presented to : [place] / [place]  Discharge Placement              Patient chooses bed at: Other - please specify in the comment section below: ([facility]) Patient to be transferred to facility by: Toula Moos DSS SW Name of family member notified: Toula Moos Patient and family notified of of transfer: 06/06/22  Discharge Plan and Services   Discharge Planning Services: CM Consult            DME Arranged: N/A DME Agency: NA       HH Arranged: PT, OT          Social Determinants of Health (SDOH) Interventions     Readmission Risk Interventions    04/29/2022   10:26 AM 03/14/2022   12:48 PM 08/23/2020   11:52 AM  Readmission Risk Prevention Plan  Transportation Screening Complete Complete Complete  PCP or Specialist Appt within 3-5 Days  Complete   HRI or [place] Complete Complete   Social Work Consult for [place] Planning/Counseling Complete Complete   Palliative Care Screening Not Applicable Not Applicable   Medication Review Press photographer) Complete Complete  Complete  HRI or [place]   Not Applicable

## 2022-06-06 NOTE — ED Notes (Signed)
Vol /pending placement

## 2022-06-06 NOTE — ED Notes (Signed)
PT last BM possibly yesterday according to patient. Pt up to walk with PT and OT

## 2022-06-06 NOTE — ED Notes (Signed)
Pt given breakfast tray

## 2022-06-06 NOTE — TOC Progression Note (Signed)
Transition of Care (TOC) - Progression Note    Patient Details  Name: Douglas Peterson. MRN: [id number] Date of Birth: [birth year]  Transition of Care [facility]) CM/SW Contact  Shelbie Hutching, RN Phone Number: 06/06/2022, 1:49 PM  Clinical Narrative:    Barrie Lyme called and is sending 2 social workers over to pick up patient now.  Patient can be discharged from the ER.    Expected Discharge Plan: Assisted Living Barriers to Discharge: Barriers Resolved  Expected Discharge Plan and Services Expected Discharge Plan: Assisted Living   Discharge Planning Services: CM Consult   Living arrangements for the past 2 months: Single Family Home                 DME Arranged: N/A DME Agency: NA       HH Arranged: PT, OT           Social Determinants of Health (SDOH) Interventions    Readmission Risk Interventions    04/29/2022   10:26 AM 03/14/2022   12:48 PM 08/23/2020   11:52 AM  Readmission Risk Prevention Plan  Transportation Screening Complete Complete Complete  PCP or Specialist Appt within 3-5 Days  Complete   HRI or [place] Complete Complete   Social Work Consult for [place] Planning/Counseling Complete Complete   Palliative Care Screening Not Applicable Not Applicable   Medication Review Press photographer) Complete Complete Complete  HRI or [place]   Not Applicable

## 2022-06-06 NOTE — NC FL2 (Signed)
[place] LEVEL OF CARE SCREENING TOOL     IDENTIFICATION  Patient Name: Douglas Peterson. Birthdate: Aug 24, 1951 Sex: male Admission Date (Current Location): 05/27/2022  [place] and [place] Number:  Engineering geologist and Address:  [facility], [address], [place], [place] [address]      Provider Number: [id number]  Attending Physician Name and Address:  No att. providers found  Relative Name and Phone Number:  [place] DSS guardian [phone]    Current Level of Care: Other (Comment) (ED boarder) Recommended Level of Care: [place] Prior Approval Number:    Date Approved/Denied:   PASRR Number:    Discharge Plan: Domiciliary (Rest home) (ALF)    Current Diagnoses: Patient Active Problem List   Diagnosis Date Noted   Maggot infestation feet 05/11/2022   Unable to care for self 05/11/2022   Debility 04/25/2022   Weakness 04/23/2022   Epistaxis 04/23/2022   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   Generalized weakness 03/18/2022   Nondisplaced fracture of shaft of left clavicle, initial encounter for closed fracture 03/18/2022   BPH (benign prostatic hyperplasia) 03/18/2022   Clavicle fracture 03/17/2022   Fall 03/17/2022   CAP (community acquired pneumonia) 03/13/2022   Altered mental status, unspecified 02/20/2022   Hypotension 02/19/2022   Dysphonia 08/13/2021   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   Paresthesia of right upper extremity 08/13/2021   SIRS (systemic inflammatory response syndrome) ([place]) 08/05/2020   GERD (gastroesophageal reflux disease) 06/05/2020   Alcohol use disorder, severe, dependence ([place]) 05/28/2020   Paroxysmal A-fib ([place]) 03/17/2020   Primary insomnia 02/06/2020   Left inguinal hernia 01/31/2020   Encounter for competency evaluation    Depression 01/17/2020   Pancytopenia ([place])    Hypomagnesemia    Alcoholic cirrhosis of liver without ascites ([place])     ETOH abuse    Alcohol intoxication ([place])    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[place]) 09/27/2019   Alcohol withdrawal delirium, acute, hyperactive ([place]) 09/21/2019   Pressure injury of ankle, stage 1 08/15/2019   Palliative care by specialist    Closed fracture of right proximal humerus 03/19/2019   Vitamin D deficiency 01/11/2019   Osteoporosis 12/22/2018   Closed nondisplaced fracture of acromial process of right scapula with routine healing 11/15/2018   Compression fracture of L2 vertebra with routine healing 11/15/2018   Delirium tremens ([place]) 03/08/2018   HTN (hypertension) 09/16/2017   Encephalopathy, 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   Steatohepatitis 12/03/2016   Abnormal liver enzymes 06/17/2016   Hereditary hemochromatosis ([place]) 06/17/2016   Anxiety 07/16/2015   Hyponatremia 07/09/2015   H/O traumatic brain injury 05/22/2014   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[place]) 05/22/2014    Orientation RESPIRATION BLADDER Height & Weight     Self, Time, Situation, Place  Normal Incontinent Weight: 59 kg Height:  5' 4"  (162.6 cm)  BEHAVIORAL SYMPTOMS/MOOD NEUROLOGICAL BOWEL NUTRITION STATUS      Incontinent Diet (regular)  AMBULATORY STATUS COMMUNICATION OF NEEDS Skin   Supervision Verbally Other (Comment) (Buttocks red from incontinence)                       Personal Care Assistance Level of Assistance  Bathing, Feeding, Dressing Bathing Assistance: Limited assistance Feeding assistance: Limited assistance Dressing Assistance: Limited assistance     Functional Limitations Info  Sight, Hearing, Speech Sight Info: Adequate Hearing Info: Adequate Speech Info: Impaired    SPECIAL CARE FACTORS FREQUENCY  PT (  By licensed PT), OT (By licensed OT)     PT Frequency: home health 2 tims per week OT Frequency: home health 2 times per week            Contractures Contractures Info: Not present    Additional Factors Info  Code Status, Allergies Code  Status Info: Full Allergies Info: hydrochlorothiazide           Current Medications (06/06/2022):    atenolol (TENORMIN) tablet 25 mg Dose: 25 mg Freq: Daily Route: PO Start: 05/31/22 1630  Admin Instructions:  (BETA BLOCKER)   baclofen (LIORESAL) tablet 10 mg Dose: 10 mg Freq: 3 times daily PRN Route: PO PRN Reason: muscle spasms Start: 06/02/22 0800   Discharge Medications: See current meds  Relevant Imaging Results:  Relevant Lab Results:   Additional Information SSN: [id number]  Shelbie Hutching, RN

## 2022-06-06 NOTE — Progress Notes (Signed)
Occupational Therapy Treatment Patient Details Name: Douglas Peterson. MRN: [id number] DOB: 07-23-1951 Today's Date: 06/06/2022   History of present illness Douglas Olivar. is a 71 y.o. 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  Per paperwork DSS went for a well check to see the patient found his house in disarray multiple beer cans and was placed under IVC due to inability to care for himself.  Patient does admit to drinking alcohol.  He says that he is "sick" complains of abdominal pain.  Says that DSS broke into his house.  He lives alone denies suicidal ideation.   OT comments  Pt supine in hospital bed and agreeable to OT intervention. Pt is pleasant and cooperative. Pt performs bed mobility without assistance to EOB. Pt seated and set up for grooming tasks and to wash hair with basin. Pt does need some assistance with thoroughness but is very engaged with task. OT assists pt with washing his back. Pt asking several questions about discharge location and when he will leave but OT does not know this information during session. Pt remains seated on EOB with PT arrives for session. Pt transitions to PT session without issue.   Recommendations for follow up therapy are one component of a multi-disciplinary discharge planning process, led by the attending physician.  Recommendations may be updated based on patient status, additional functional criteria and insurance authorization.    Follow Up Recommendations  Home health OT    Assistance Recommended at Discharge Intermittent Supervision/Assistance  Patient can return home with the following  A little help with walking and/or transfers;A little help with bathing/dressing/bathroom;Assistance with cooking/housework;Direct supervision/assist for medications management;Assist for transportation;Help with stairs or ramp for entrance   Equipment Recommendations  None  recommended by OT       Precautions / Restrictions Precautions Precautions: Fall Type of Shoulder Precautions: hx of L clavicle fx 03/18/22 Restrictions Weight Bearing Restrictions: No RUE Weight Bearing: Weight bearing as tolerated LUE Weight Bearing: Weight bearing as tolerated       Mobility Bed Mobility Overal bed mobility: Modified Independent                  Transfers Overall transfer level: Needs assistance Equipment used: Straight cane Transfers: Sit to/from Stand Sit to Stand: Supervision, Min guard                 Balance Overall balance assessment: Needs assistance Sitting-balance support: No upper extremity supported, Feet unsupported Sitting balance-Leahy Scale: Good                                     ADL either performed or assessed with clinical judgement   ADL Overall ADL's : Needs assistance/impaired                                       General ADL Comments: set up A to wash, dry, and comb hair . Pt does need some assistance with thoroughness.    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            Vision Patient Visual Report: No change from baseline  Cognition Arousal/Alertness: Awake/alert Behavior During Therapy: WFL for tasks assessed/performed Overall Cognitive Status: Within Functional Limits for tasks assessed                                                     Pertinent Vitals/ Pain       Pain Assessment Pain Assessment: No/denies pain         Frequency  Min 2X/week        Progress Toward Goals  OT Goals(current goals can now be found in the care plan section)  Progress towards OT goals: Progressing toward goals  Acute Rehab OT Goals Patient Stated Goal: to get stronger OT Goal Formulation: With patient Time For Goal  Achievement: 06/11/22 Potential to Achieve Goals: Good  Plan Discharge plan remains appropriate;Frequency remains appropriate       AM-PAC OT "6 Clicks" Daily Activity     Outcome Measure   Help from another person eating meals?: None Help from another person taking care of personal grooming?: A Little Help from another person toileting, which includes using toliet, bedpan, or urinal?: A Little Help from another person bathing (including washing, rinsing, drying)?: A Little Help from another person to put on and taking off regular upper body clothing?: A Little Help from another person to put on and taking off regular lower body clothing?: A Lot 6 Click Score: 18    End of Session    OT Visit Diagnosis: Muscle weakness (generalized) (M62.81);Unsteadiness on feet (R26.81) Hemiplegia - Right/Left: Right   Activity Tolerance Patient tolerated treatment well   Patient Left in bed;with call bell/phone within reach;Other (comment) (PT arrived for session)   Nurse Communication Mobility status        Time: 5320-2334 OT Time Calculation (min): 20 min  Charges: OT General Charges $OT Visit: 1 Visit OT Treatments $Self Care/Home Management : 8-22 mins  Darleen Crocker, MS, OTR/L , CBIS ascom [phone]  06/06/22, 2:48 PM

## 2022-06-06 NOTE — ED Notes (Signed)
Patient resting in bed free from sign of distress. Breathing unlabored speaking in full sentences with symmetric chest rise and fall. Bed low and locked with side rails raised x2. Call bell in reach and monitor in place.

## 2022-06-06 NOTE — ED Notes (Signed)
Pt assisted to toilet for BM

## 2022-06-06 NOTE — ED Notes (Signed)
Pt given black coffee and menu

## 2022-06-13 ENCOUNTER — Emergency Department
Admission: EM | Admit: 2022-06-13 | Discharge: 2022-06-13 | Disposition: A | Discharge status: Home / Self Care | Payer: Medicare HMO | Attending: Student in an Organized Health Care Education/Training Program | Admitting: Student in an Organized Health Care Education/Training Program

## 2022-06-13 ENCOUNTER — Other Ambulatory Visit: Payer: Self-pay

## 2022-06-13 ENCOUNTER — Emergency Department: Payer: Medicare HMO

## 2022-06-13 DIAGNOSIS — K5900 Constipation, unspecified: Secondary | ICD-10-CM | POA: Diagnosis not present

## 2022-06-13 DIAGNOSIS — R109 Unspecified abdominal pain: Secondary | ICD-10-CM | POA: Diagnosis present

## 2022-06-13 LAB — COMPREHENSIVE METABOLIC PANEL
ALT: 43 U/L (ref 0–44)
AST: 25 U/L (ref 15–41)
Albumin: 4.3 g/dL (ref 3.5–5.0)
Alkaline Phosphatase: 92 U/L (ref 38–126)
Anion gap: 6 (ref 5–15)
BUN: 10 mg/dL (ref 8–23)
CO2: 24 mmol/L (ref 22–32)
Calcium: 9.3 mg/dL (ref 8.9–10.3)
Chloride: 104 mmol/L (ref 98–111)
Creatinine, Ser: 0.55 mg/dL — ABNORMAL LOW (ref 0.61–1.24)
GFR, Estimated: >60 mL/min (ref >60)
Glucose, Bld: 103 mg/dL — ABNORMAL HIGH (ref 70–99)
Potassium: 3.6 mmol/L (ref 3.5–5.1)
Sodium: 134 mmol/L — ABNORMAL LOW (ref 135–145)
Total Bilirubin: 0.6 mg/dL (ref 0.3–1.2)
Total Protein: 7.1 g/dL (ref 6.5–8.1)

## 2022-06-13 LAB — CBC
HCT: 45.1 % (ref 39.0–52.0)
Hemoglobin: 14.5 g/dL (ref 13.0–17.0)
MCH: 29.9 pg (ref 26.0–34.0)
MCHC: 32.2 g/dL (ref 30.0–36.0)
MCV: 93 fL (ref 80.0–100.0)
Platelets: 214 10*3/uL (ref 150–400)
RBC: 4.85 MIL/uL (ref 4.22–5.81)
RDW: 12 % (ref 11.5–15.5)
WBC: 8.9 10*3/uL (ref 4.0–10.5)
nRBC: 0 % (ref 0.0–0.2)

## 2022-06-13 MED ORDER — LACTULOSE 10 GM/15ML PO SOLN: 30.0000 g | Freq: Once | ORAL | Status: DC

## 2022-06-13 MED ORDER — POLYETHYLENE GLYCOL 3350 17 G PO PACK
17.0000 g | PACK | Freq: Every day | ORAL | 0 refills | Status: DC
Start: 2022-06-13 — End: 2023-06-26

## 2022-06-13 NOTE — ED Notes (Signed)
Social worker Anders Grant notified of pts presence in the ER and his pending discharge back to [facility].

## 2022-06-13 NOTE — ED Notes (Signed)
Called [facility] for transport to [facility]

## 2022-06-13 NOTE — ED Provider Notes (Signed)
[facility] Provider Note    Event Date/Time   First MD Initiated Contact with Patient 06/13/22 1655     (approximate)   History   Abdominal Pain   HPI  Douglas Peterson. is a 71 y.o. male well-known to this facility presents the ER for evaluation of constipation and trouble moving his bowels.  He had some crampy abdominal pain earlier today.  States he was straining to move his bowels and felt like his hernia popped out.  Denies any pain right now no fevers no nausea or vomiting.  Does not feel any urge to move his bowels.  He is passing gas.       Physical Exam   Triage Vital Signs: ED Triage Vitals  Enc Vitals Group     BP 06/13/22 1024 (!) 114/92     Pulse Rate 06/13/22 1024 63     Resp 06/13/22 1024 18     Temp 06/13/22 1024 98.2 F (36.8 C)     Temp Source 06/13/22 1024 Oral     SpO2 06/13/22 1024 96 %     Weight 06/13/22 1024 130 lb (59 kg)     Height 06/13/22 1024 5' 4"  (1.626 m)     Head Circumference --      Peak Flow --      Pain Score 06/13/22 1033 4     Pain Loc --      Pain Edu? --      Excl. in [place]? --     Most recent vital signs: Vitals:   06/13/22 1024  BP: (!) 114/92  Pulse: 63  Resp: 18  Temp: 98.2 F (36.8 C)  SpO2: 96%     Constitutional: Alert  Eyes: Conjunctivae are normal.  Head: Atraumatic. Nose: No congestion/rhinnorhea. Mouth/Throat: Mucous membranes are moist.   Neck: Painless ROM.  Cardiovascular:   Good peripheral circulation. Respiratory: Normal respiratory effort.  No retractions.  Gastrointestinal: Soft and nontender.  Soft reducible left inguinal hernia no overlying erythema.Musculoskeletal:  no deformity Neurologic:  MAE spontaneously. No gross focal neurologic deficits are appreciated.  Skin:  Skin is warm, dry and intact. No rash noted. Psychiatric: Mood and affect are normal. Speech and behavior are normal.    ED Results / Procedures / Treatments   Labs (all labs ordered are listed,  but only abnormal results are displayed) Labs Reviewed  COMPREHENSIVE METABOLIC PANEL - Abnormal; Notable for the following components:      Result Value   Sodium 134 (*)    Glucose, Bld 103 (*)    Creatinine, Ser 0.55 (*)    All other components within normal limits  CBC  URINALYSIS, ROUTINE W REFLEX MICROSCOPIC     EKG  RADIOLOGY Please see ED Course for my review and interpretation.  I personally reviewed all radiographic images ordered to evaluate for the above acute complaints and reviewed radiology reports and findings.  These findings were personally discussed with the patient.  Please see medical record for radiology report.    PROCEDURES:  Critical Care performed: No  Procedures   MEDICATIONS ORDERED IN ED: Medications  lactulose (CHRONULAC) 10 GM/15ML solution 30 g (has no administration in time range)     IMPRESSION / MDM / ASSESSMENT AND PLAN / ED COURSE  I reviewed the triage vital signs and the nursing notes.  Differential diagnosis includes, but is not limited to, constipation, impaction, obstruction, enteritis, diverticulitis, hernia  Patient scented to the ER for evaluation of symptoms as described above.  He is clinically well-appearing in no acute distress.  His abdominal exam is soft and benign as described above.  No evidence of obstructive pattern.  Recommended evaluation for possible disimpaction but patient states he is got like this before and is managed with oral oral laxatives and states he prefer that.  At this point do believe he stable and appropriate for outpatient follow-up.  We discussed return precautions.      FINAL CLINICAL IMPRESSION(S) / ED DIAGNOSES   Final diagnoses:  Constipation, unspecified constipation type     Rx / DC Orders   ED Discharge Orders          Ordered    polyethylene glycol (MIRALAX / GLYCOLAX) 17 g packet  Daily        06/13/22 1752             Note:  This  document was prepared using Dragon voice recognition software and may include unintentional dictation errors.    [name], MD 06/13/22 1754

## 2022-06-13 NOTE — ED Notes (Signed)
Patient discharged at this time. Pt discharged with EMS transport [facility] to [facility].  Breathing unlabored speaking in full sentences. Verbalized understanding of all discharge, follow up, and medication teaching. Discharged homed with all belongings.

## 2022-06-13 NOTE — ED Notes (Signed)
Patient provided with sandwich tray per request. Patient resting in bed free from sign of distress. Breathing unlabored speaking in full sentences with symmetric chest rise and fall. Bed low and locked with side rails raised x2. Call bell in reach.

## 2022-06-13 NOTE — Discharge Instructions (Addendum)
Please be sure to take miralax once daily (can be given twice daily for moderate constipation).  Return for any additional questions or concerns.

## 2022-06-13 NOTE — ED Notes (Signed)
Pt brought in from home place of [facility] by EMS for co abd pain,. States no BM for few days wants a laxative.

## 2022-06-13 NOTE — ED Triage Notes (Signed)
Pt in with co abd pain states has not had BM for few days, asked for laxative at homeplace without success.

## 2022-09-30 ENCOUNTER — Ambulatory Visit (INDEPENDENT_AMBULATORY_CARE_PROVIDER_SITE_OTHER): Payer: Medicare HMO | Admitting: Podiatry

## 2022-09-30 ENCOUNTER — Ambulatory Visit (INDEPENDENT_AMBULATORY_CARE_PROVIDER_SITE_OTHER): Payer: Medicare HMO

## 2022-09-30 DIAGNOSIS — M25871 Other specified joint disorders, right ankle and foot: Secondary | ICD-10-CM

## 2022-09-30 MED ORDER — BETAMETHASONE SOD PHOS & ACET 6 (3-3) MG/ML IJ SUSP
3.0000 mg | Freq: Once | INTRAMUSCULAR | Status: AC
  Administered 2022-09-30: 3 mg via INTRA_ARTICULAR

## 2022-09-30 MED ORDER — MELOXICAM 15 MG PO TABS: 15.0000 mg | ORAL_TABLET | Freq: Every day | ORAL | 1 refills | Status: DC
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
## 2022-10-31 ENCOUNTER — Ambulatory Visit (INDEPENDENT_AMBULATORY_CARE_PROVIDER_SITE_OTHER): Payer: Medicare HMO | Admitting: Podiatry

## 2022-10-31 DIAGNOSIS — M25871 Other specified joint disorders, right ankle and foot: Secondary | ICD-10-CM

## 2022-10-31 MED ORDER — BETAMETHASONE SOD PHOS & ACET 6 (3-3) MG/ML IJ SUSP
3.0000 mg | Freq: Once | INTRAMUSCULAR | Status: AC
  Administered 2022-10-31: 3 mg via INTRA_ARTICULAR
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
## 2022-11-25 ENCOUNTER — Other Ambulatory Visit: Payer: Self-pay | Admitting: Podiatry

## 2022-12-23 ENCOUNTER — Other Ambulatory Visit: Payer: Self-pay | Admitting: Podiatry

## 2023-01-24 ENCOUNTER — Other Ambulatory Visit: Payer: Self-pay | Admitting: Podiatry

## 2023-02-23 ENCOUNTER — Other Ambulatory Visit: Payer: Self-pay | Admitting: Podiatry

## 2023-03-23 ENCOUNTER — Other Ambulatory Visit: Payer: Self-pay | Admitting: Podiatry

## 2023-03-31 ENCOUNTER — Ambulatory Visit: Payer: Medicare HMO | Admitting: Podiatry

## 2023-05-01 ENCOUNTER — Other Ambulatory Visit: Payer: Self-pay

## 2023-05-01 ENCOUNTER — Encounter: Payer: Self-pay | Admitting: Emergency Medicine

## 2023-05-01 ENCOUNTER — Inpatient Hospital Stay
Admission: EM | Admit: 2023-05-01 | Discharge: 2023-05-12 | DRG: 641 | Disposition: A | Discharge status: Skilled Nursing Facility | Payer: Medicare HMO | Attending: Internal Medicine | Admitting: Internal Medicine

## 2023-05-01 DIAGNOSIS — Z87820 Personal history of traumatic brain injury: Secondary | ICD-10-CM

## 2023-05-01 DIAGNOSIS — N400 Enlarged prostate without lower urinary tract symptoms: Secondary | ICD-10-CM | POA: Diagnosis present

## 2023-05-01 DIAGNOSIS — L89311 Pressure ulcer of right buttock, stage 1: Secondary | ICD-10-CM | POA: Diagnosis present

## 2023-05-01 DIAGNOSIS — E878 Other disorders of electrolyte and fluid balance, not elsewhere classified: Secondary | ICD-10-CM | POA: Diagnosis present

## 2023-05-01 DIAGNOSIS — M810 Age-related osteoporosis without current pathological fracture: Secondary | ICD-10-CM | POA: Diagnosis present

## 2023-05-01 DIAGNOSIS — R531 Weakness: Principal | ICD-10-CM

## 2023-05-01 DIAGNOSIS — F1029 Alcohol dependence with unspecified alcohol-induced disorder: Secondary | ICD-10-CM | POA: Diagnosis present

## 2023-05-01 DIAGNOSIS — E785 Hyperlipidemia, unspecified: Secondary | ICD-10-CM | POA: Diagnosis present

## 2023-05-01 DIAGNOSIS — Z79899 Other long term (current) drug therapy: Secondary | ICD-10-CM | POA: Diagnosis not present

## 2023-05-01 DIAGNOSIS — G8111 Spastic hemiplegia affecting right dominant side: Secondary | ICD-10-CM | POA: Diagnosis present

## 2023-05-01 DIAGNOSIS — Z87891 Personal history of nicotine dependence: Secondary | ICD-10-CM

## 2023-05-01 DIAGNOSIS — E871 Hypo-osmolality and hyponatremia: Secondary | ICD-10-CM | POA: Diagnosis not present

## 2023-05-01 DIAGNOSIS — Z888 Allergy status to other drugs, medicaments and biological substances status: Secondary | ICD-10-CM | POA: Diagnosis not present

## 2023-05-01 DIAGNOSIS — Z66 Do not resuscitate: Secondary | ICD-10-CM | POA: Diagnosis not present

## 2023-05-01 DIAGNOSIS — F10239 Alcohol dependence with withdrawal, unspecified: Secondary | ICD-10-CM | POA: Diagnosis not present

## 2023-05-01 DIAGNOSIS — I10 Essential (primary) hypertension: Secondary | ICD-10-CM | POA: Diagnosis present

## 2023-05-01 DIAGNOSIS — Z8249 Family history of ischemic heart disease and other diseases of the circulatory system: Secondary | ICD-10-CM

## 2023-05-01 DIAGNOSIS — K5900 Constipation, unspecified: Secondary | ICD-10-CM | POA: Diagnosis not present

## 2023-05-01 DIAGNOSIS — R7401 Elevation of levels of liver transaminase levels: Secondary | ICD-10-CM | POA: Diagnosis present

## 2023-05-01 DIAGNOSIS — I480 Paroxysmal atrial fibrillation: Secondary | ICD-10-CM | POA: Diagnosis present

## 2023-05-01 DIAGNOSIS — E876 Hypokalemia: Secondary | ICD-10-CM | POA: Diagnosis present

## 2023-05-01 DIAGNOSIS — Z7189 Other specified counseling: Secondary | ICD-10-CM | POA: Diagnosis not present

## 2023-05-01 DIAGNOSIS — H9191 Unspecified hearing loss, right ear: Secondary | ICD-10-CM | POA: Diagnosis present

## 2023-05-01 DIAGNOSIS — R1312 Dysphagia, oropharyngeal phase: Secondary | ICD-10-CM | POA: Diagnosis present

## 2023-05-01 DIAGNOSIS — K219 Gastro-esophageal reflux disease without esophagitis: Secondary | ICD-10-CM | POA: Diagnosis present

## 2023-05-01 DIAGNOSIS — F1020 Alcohol dependence, uncomplicated: Secondary | ICD-10-CM | POA: Insufficient documentation

## 2023-05-01 LAB — CBC WITH DIFFERENTIAL/PLATELET
Abs Immature Granulocytes: 0.02 10*3/uL (ref 0.00–0.07)
Basophils Absolute: 0.1 10*3/uL (ref 0.0–0.1)
Basophils Relative: 1 %
Eosinophils Absolute: 0 10*3/uL (ref 0.0–0.5)
Eosinophils Relative: 0 %
HCT: 44.9 % (ref 39.0–52.0)
Hemoglobin: 15.8 g/dL (ref 13.0–17.0)
Immature Granulocytes: 0 %
Lymphocytes Relative: 47 %
Lymphs Abs: 4.8 10*3/uL — ABNORMAL HIGH (ref 0.7–4.0)
MCH: 32.6 pg (ref 26.0–34.0)
MCHC: 35.2 g/dL (ref 30.0–36.0)
MCV: 92.6 fL (ref 80.0–100.0)
Monocytes Absolute: 0.5 10*3/uL (ref 0.1–1.0)
Monocytes Relative: 5 %
Neutro Abs: 4.8 10*3/uL (ref 1.7–7.7)
Neutrophils Relative %: 47 %
Platelets: 223 10*3/uL (ref 150–400)
RBC: 4.85 MIL/uL (ref 4.22–5.81)
RDW: 12.3 % (ref 11.5–15.5)
WBC: 10.3 10*3/uL (ref 4.0–10.5)
nRBC: 0 % (ref 0.0–0.2)

## 2023-05-01 LAB — CBC
HCT: 43.3 % (ref 39.0–52.0)
Hemoglobin: 15.2 g/dL (ref 13.0–17.0)
MCH: 32.2 pg (ref 26.0–34.0)
MCHC: 35.1 g/dL (ref 30.0–36.0)
MCV: 91.7 fL (ref 80.0–100.0)
Platelets: 169 10*3/uL (ref 150–400)
RBC: 4.72 MIL/uL (ref 4.22–5.81)
RDW: 12.3 % (ref 11.5–15.5)
WBC: 6.6 10*3/uL (ref 4.0–10.5)
nRBC: 0 % (ref 0.0–0.2)

## 2023-05-01 LAB — COMPREHENSIVE METABOLIC PANEL
ALT: 135 U/L — ABNORMAL HIGH (ref 0–44)
ALT: 119 U/L — ABNORMAL HIGH (ref 0–44)
AST: 176 U/L — ABNORMAL HIGH (ref 15–41)
AST: 152 U/L — ABNORMAL HIGH (ref 15–41)
Albumin: 4.2 g/dL (ref 3.5–5.0)
Albumin: 3.7 g/dL (ref 3.5–5.0)
Alkaline Phosphatase: 137 U/L — ABNORMAL HIGH (ref 38–126)
Alkaline Phosphatase: 128 U/L — ABNORMAL HIGH (ref 38–126)
Anion gap: 15 (ref 5–15)
Anion gap: 11 (ref 5–15)
BUN: <5 mg/dL — ABNORMAL LOW (ref 8–23)
BUN: <5 mg/dL — ABNORMAL LOW (ref 8–23)
CO2: 23 mmol/L (ref 22–32)
CO2: 24 mmol/L (ref 22–32)
Calcium: 8.4 mg/dL — ABNORMAL LOW (ref 8.9–10.3)
Calcium: 8 mg/dL — ABNORMAL LOW (ref 8.9–10.3)
Chloride: 85 mmol/L — ABNORMAL LOW (ref 98–111)
Chloride: 95 mmol/L — ABNORMAL LOW (ref 98–111)
Creatinine, Ser: 0.41 mg/dL — ABNORMAL LOW (ref 0.61–1.24)
Creatinine, Ser: 0.51 mg/dL — ABNORMAL LOW (ref 0.61–1.24)
GFR, Estimated: >60 mL/min (ref >60)
GFR, Estimated: >60 mL/min (ref >60)
Glucose, Bld: 109 mg/dL — ABNORMAL HIGH (ref 70–99)
Glucose, Bld: 91 mg/dL (ref 70–99)
Potassium: 3.4 mmol/L — ABNORMAL LOW (ref 3.5–5.1)
Potassium: 3.6 mmol/L (ref 3.5–5.1)
Sodium: 123 mmol/L — ABNORMAL LOW (ref 135–145)
Sodium: 129 mmol/L — ABNORMAL LOW (ref 135–145)
Total Bilirubin: 1.2 mg/dL (ref 0.3–1.2)
Total Bilirubin: 1.2 mg/dL (ref 0.3–1.2)
Total Protein: 7.7 g/dL (ref 6.5–8.1)
Total Protein: 6.8 g/dL (ref 6.5–8.1)

## 2023-05-01 LAB — OSMOLALITY: Osmolality: 311 mOsm/kg — ABNORMAL HIGH (ref 275–295)

## 2023-05-01 LAB — URINALYSIS, ROUTINE W REFLEX MICROSCOPIC
Bacteria, UA: NONE SEEN
Bilirubin Urine: NEGATIVE
Glucose, UA: NEGATIVE mg/dL
Ketones, ur: NEGATIVE mg/dL
Leukocytes,Ua: NEGATIVE
Nitrite: NEGATIVE
Protein, ur: NEGATIVE mg/dL
Specific Gravity, Urine: 1.004 — ABNORMAL LOW (ref 1.005–1.030)
Squamous Epithelial / HPF: NONE SEEN /HPF (ref 0–5)
pH: 6 (ref 5.0–8.0)

## 2023-05-01 LAB — NA AND K (SODIUM & POTASSIUM), RAND UR
Potassium Urine: 19 mmol/L
Sodium, Ur: <10 mmol/L

## 2023-05-01 LAB — OSMOLALITY, URINE: Osmolality, Ur: 186 mOsm/kg — ABNORMAL LOW (ref 300–900)

## 2023-05-01 LAB — SODIUM
Sodium: 133 mmol/L — ABNORMAL LOW (ref 135–145)
Sodium: 132 mmol/L — ABNORMAL LOW (ref 135–145)

## 2023-05-01 LAB — LIPASE, BLOOD: Lipase: 40 U/L (ref 11–51)

## 2023-05-01 LAB — TSH: TSH: 1.364 u[IU]/mL (ref 0.350–4.500)

## 2023-05-01 MED ORDER — POLYETHYLENE GLYCOL 3350 17 G PO PACK: 17.0000 g | PACK | Freq: Every day | ORAL | Status: DC

## 2023-05-01 MED ORDER — ENOXAPARIN SODIUM 40 MG/0.4ML IJ SOSY
40.0000 mg | PREFILLED_SYRINGE | INTRAMUSCULAR | Status: DC
  Administered 2023-05-01 – 2023-05-12 (×12): 40 mg via SUBCUTANEOUS
  Filled 2023-05-01 (×12): qty 0.4

## 2023-05-01 MED ORDER — LORAZEPAM 2 MG/ML IJ SOLN
1.0000 mg | INTRAMUSCULAR | Status: AC | PRN
  Administered 2023-05-01: 2 mg via INTRAVENOUS
  Administered 2023-05-02: 1 mg via INTRAVENOUS
  Administered 2023-05-02: 2 mg via INTRAVENOUS
  Filled 2023-05-01 (×3): qty 1

## 2023-05-01 MED ORDER — ATENOLOL 25 MG PO TABS
25.0000 mg | ORAL_TABLET | Freq: Every day | ORAL | Status: DC
  Administered 2023-05-01 – 2023-05-12 (×12): 25 mg via ORAL
  Filled 2023-05-01 (×12): qty 1

## 2023-05-01 MED ORDER — THIAMINE MONONITRATE 100 MG PO TABS
100.0000 mg | ORAL_TABLET | Freq: Every day | ORAL | Status: DC
  Administered 2023-05-01 – 2023-05-12 (×11): 100 mg via ORAL
  Filled 2023-05-01 (×12): qty 1

## 2023-05-01 MED ORDER — ENSURE ENLIVE PO LIQD
237.0000 mL | Freq: Two times a day (BID) | ORAL | Status: DC
  Administered 2023-05-01 – 2023-05-12 (×20): 237 mL via ORAL

## 2023-05-01 MED ORDER — THIAMINE HCL 100 MG/ML IJ SOLN: Freq: Once | INTRAVENOUS | Status: DC

## 2023-05-01 MED ORDER — THIAMINE HCL 100 MG PO TABS: 100.0000 mg | ORAL_TABLET | Freq: Every day | ORAL | Status: DC

## 2023-05-01 MED ORDER — THIAMINE HCL 100 MG/ML IJ SOLN
100.0000 mg | Freq: Every day | INTRAMUSCULAR | Status: DC
  Administered 2023-05-02: 100 mg via INTRAVENOUS
  Filled 2023-05-01 (×2): qty 2

## 2023-05-01 MED ORDER — ACETAMINOPHEN 325 MG PO TABS
650.0000 mg | ORAL_TABLET | Freq: Four times a day (QID) | ORAL | Status: DC | PRN
  Administered 2023-05-03 – 2023-05-06 (×2): 650 mg via ORAL
  Filled 2023-05-01 (×2): qty 2

## 2023-05-01 MED ORDER — POTASSIUM CHLORIDE IN NACL 20-0.9 MEQ/L-% IV SOLN
INTRAVENOUS | Status: DC
  Filled 2023-05-01 (×17): qty 1000

## 2023-05-01 MED ORDER — ADULT MULTIVITAMIN W/MINERALS CH: 1.0000 | ORAL_TABLET | Freq: Every day | ORAL | Status: DC

## 2023-05-01 MED ORDER — MAGNESIUM HYDROXIDE 400 MG/5ML PO SUSP
30.0000 mL | Freq: Every day | ORAL | Status: DC | PRN
  Administered 2023-05-08: 30 mL via ORAL
  Filled 2023-05-01 (×2): qty 30

## 2023-05-01 MED ORDER — LORAZEPAM 1 MG PO TABS
1.0000 mg | ORAL_TABLET | ORAL | Status: AC | PRN
  Administered 2023-05-02: 2 mg via ORAL
  Filled 2023-05-01 (×2): qty 2

## 2023-05-01 MED ORDER — FOLIC ACID 1 MG PO TABS
1.0000 mg | ORAL_TABLET | Freq: Every day | ORAL | Status: DC
  Administered 2023-05-01 – 2023-05-12 (×12): 1 mg via ORAL
  Filled 2023-05-01 (×12): qty 1

## 2023-05-01 MED ORDER — ONDANSETRON HCL 4 MG/2ML IJ SOLN
4.0000 mg | INTRAMUSCULAR | Status: DC | PRN
  Administered 2023-05-01 – 2023-05-03 (×2): 4 mg via INTRAVENOUS

## 2023-05-01 MED ORDER — FOLIC ACID 1 MG PO TABS: 1.0000 mg | ORAL_TABLET | Freq: Every day | ORAL | Status: DC

## 2023-05-01 MED ORDER — LORAZEPAM 2 MG/ML IJ SOLN
0.0000 mg | Freq: Four times a day (QID) | INTRAMUSCULAR | Status: AC
  Administered 2023-05-01 – 2023-05-02 (×4): 2 mg via INTRAVENOUS
  Administered 2023-05-02: 1 mg via INTRAVENOUS
  Filled 2023-05-01 (×5): qty 1

## 2023-05-01 MED ORDER — ADULT MULTIVITAMIN W/MINERALS CH
1.0000 | ORAL_TABLET | Freq: Once | ORAL | Status: DC
  Filled 2023-05-01: qty 1

## 2023-05-01 MED ORDER — ACETAMINOPHEN 650 MG RE SUPP: 650.0000 mg | Freq: Four times a day (QID) | RECTAL | Status: DC | PRN

## 2023-05-01 MED ORDER — SODIUM CHLORIDE 0.9 % IV BOLUS
1000.0000 mL | Freq: Once | INTRAVENOUS | Status: AC
  Administered 2023-05-01: 1000 mL via INTRAVENOUS

## 2023-05-01 MED ORDER — TRAZODONE HCL 50 MG PO TABS
25.0000 mg | ORAL_TABLET | Freq: Every evening | ORAL | Status: DC | PRN
  Administered 2023-05-05 – 2023-05-07 (×3): 25 mg via ORAL
  Filled 2023-05-01 (×3): qty 1

## 2023-05-01 MED ORDER — ONDANSETRON HCL 4 MG PO TABS: 4.0000 mg | ORAL_TABLET | Freq: Three times a day (TID) | ORAL | Status: DC | PRN

## 2023-05-01 MED ORDER — SENNOSIDES-DOCUSATE SODIUM 8.6-50 MG PO TABS
2.0000 | ORAL_TABLET | Freq: Every evening | ORAL | Status: DC | PRN
  Filled 2023-05-01: qty 2

## 2023-05-01 MED ORDER — ATORVASTATIN CALCIUM 10 MG PO TABS
10.0000 mg | ORAL_TABLET | Freq: Every day | ORAL | Status: DC
  Administered 2023-05-01 – 2023-05-12 (×12): 10 mg via ORAL
  Filled 2023-05-01 (×12): qty 1

## 2023-05-01 MED ORDER — ONDANSETRON HCL 4 MG/2ML IJ SOLN
4.0000 mg | Freq: Four times a day (QID) | INTRAMUSCULAR | Status: DC | PRN
  Administered 2023-05-07 – 2023-05-10 (×3): 4 mg via INTRAVENOUS
  Filled 2023-05-01 (×5): qty 2

## 2023-05-01 MED ORDER — ADULT MULTIVITAMIN W/MINERALS CH
1.0000 | ORAL_TABLET | Freq: Every day | ORAL | Status: DC
  Administered 2023-05-01 – 2023-05-12 (×12): 1 via ORAL
  Filled 2023-05-01 (×12): qty 1

## 2023-05-01 MED ORDER — FOLIC ACID 1 MG PO TABS
1.0000 mg | ORAL_TABLET | Freq: Once | ORAL | Status: DC
  Filled 2023-05-01: qty 1

## 2023-05-01 MED ORDER — THIAMINE MONONITRATE 100 MG PO TABS
100.0000 mg | ORAL_TABLET | Freq: Once | ORAL | Status: DC
  Filled 2023-05-01: qty 1

## 2023-05-01 MED ORDER — ONDANSETRON HCL 4 MG PO TABS
4.0000 mg | ORAL_TABLET | Freq: Four times a day (QID) | ORAL | Status: DC | PRN
  Administered 2023-05-04 – 2023-05-12 (×5): 4 mg via ORAL
  Filled 2023-05-01 (×6): qty 1

## 2023-05-01 NOTE — Progress Notes (Signed)
Left arm elevated on the pillow due to level 2 swelling from infiltration of IV fluids.
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
## 2023-05-01 NOTE — Assessment & Plan Note (Signed)
-   We will continue Atenolo. - She is currently in sinus rhythm. - He is not a candidate for anticoagulation given heavy alcohol use and high fall risk.

## 2023-05-01 NOTE — Progress Notes (Signed)
Brief hospitalist update note.  This is a nonbillable note.  Please see same-day H&P for full billable details.  Briefly, this is a 72 year old male with history significant for alcohol dependence who was previously under DSS guardianship (no longer) who presents to the ED with generalized weakness for the last few weeks associated nausea.  Does admit to drinking.  Is high risk for withdrawals but currently not acutely withdrawing.  Sodium levels have been improving.  Plan: Continue IVF.  Monitor serum sodium.  Avoid overcorrection.  CIWA protocol.  Lolita Patella MD  No charge

## 2023-05-01 NOTE — Assessment & Plan Note (Signed)
-   He will be placed on CIWA protocol with as needed IV Ativan.

## 2023-05-01 NOTE — TOC Progression Note (Signed)
Transition of Care (TOC) - Progression Note    Patient Details  Name: Douglas Peterson. MRN: [id number] Date of Birth: 1951/11/02  Transition of Care [facility]) CM/SW Contact  [name], RN Phone Number: 05/01/2023, 11:38 AM  Clinical Narrative:      Per Milinda Pointer at dss "he is his own person and [employer] does not have any involvement with Mr. Pesqueira.  He is living in his home on [address]."  Expected Discharge Plan: Home w Home Health Services Barriers to Discharge: Continued Medical Work up  Expected Discharge Plan and Services                                               Social Determinants of Health (SDOH) Interventions SDOH Screenings   Food Insecurity: No Food Insecurity (05/01/2023)  Housing: Low Risk  (05/01/2023)  Transportation Needs: No Transportation Needs (05/01/2023)  Utilities: Not At Risk (05/01/2023)  Financial Resource Strain: Unknown (06/13/2019)  Physical Activity: Unknown (06/13/2019)  Social Connections: Unknown (06/13/2019)  Stress: Unknown (06/13/2019)  Tobacco Use: Medium Risk (05/01/2023)    Readmission Risk Interventions    04/29/2022   10:26 AM 03/14/2022   12:48 PM 08/23/2020   11:52 AM  Readmission Risk Prevention Plan  Transportation Screening Complete Complete Complete  PCP or Specialist Appt within 3-5 Days  Complete   HRI or Home Care Consult Complete Complete   Social Work Consult for Recovery Care Planning/Counseling Complete Complete   Palliative Care Screening Not Applicable Not Applicable   Medication Review Oceanographer) Complete Complete Complete  HRI or Home Care Consult   Complete  Skilled Nursing Facility   Not Applicable

## 2023-05-01 NOTE — ED Triage Notes (Signed)
Patient BIB [facility] c/o nosebleed, bleeding controlled.  EMS reports that patient is also c/o weakness and diarrhea x 2 weeks, abdominal pain x 2 days, and nausea x 1 month.  EMS reports patient told them he hasn't ate in 8 days, but did drink a bottle of wine today.  157/94 94 HR 95% RA 116 CBG

## 2023-05-01 NOTE — Assessment & Plan Note (Signed)
-  We will replace potassium and check magnesium level. 

## 2023-05-01 NOTE — Assessment & Plan Note (Addendum)
-   This is main reason for his generalized weakness. - The will be admitted to a medical telemetry bed. - We will continue hydration with IV normal saline. - We will follow serial sodium levels. - Hyponatremia workup will be obtained. - This could be partly volume depletion from his nausea and diarrhea and partly hypervolemic from excessive alcohol intake and alcoholic potomania.

## 2023-05-01 NOTE — Assessment & Plan Note (Signed)
-   We will continue his antihypertensive therapy. 

## 2023-05-01 NOTE — ED Notes (Signed)
Patient unable to take oral medications at this time due to nausea,

## 2023-05-01 NOTE — Discharge Instructions (Signed)
                  Intensive Outpatient Programs  [facility]    [facility] [address]     [address] [place],  [place]     [place], [place] [phone]      [phone]  [facility]   [facility]  (Inpatient and outpatient)  [phone] (Suboxone and Methadone) [address]           [phone]           [facility]    [facility] - Intensive Outpatient [address]     [address] [place], [place] [address]     [place], [place]  [phone]      [phone]  [facility] (Outpatient, Inpatient, [facility]  [facility] (Groups and [facility]) (insurance only) [phone]    [place], [place]          [phone]       [facility]    [facility] (for caregivers and family) [address]     [address] [place], [place]     [place], [place] [phone]      [phone]  Residential Treatment Programs  [facility]  [facility](2 years) Residential: 90 days)  [facility] [address]      [address] [place], [place]     [place], [place] [phone]      [phone] or [phone]  [facility]    [facility] [address]      [address] [place], [place]     , [place] [phone]      [phone]  [facility]   [facility] ([facility]) [address]     [address] [place], [place] [address]     [place], [place] [phone]      [phone] Admissions: 8am-3pm M-F  [facility]: Residential Program (90 Days)              [facility]: [facility]  [place], [place]     [place],   [phone] or [phone]    (Walk in Hours over the weekend or by referral)   [facility]: [facility]:[phone] (for crisis  response 24 hours a day) 

## 2023-05-01 NOTE — Assessment & Plan Note (Signed)
-   We will continue statin therapy. 

## 2023-05-01 NOTE — TOC Initial Note (Signed)
Transition of Care (TOC) - Initial/Assessment Note    Patient Details  Name: Douglas Peterson. MRN: [id number] Date of Birth: November 03, 1951  Transition of Care [name]) CM/SW Contact:    [name], RN Phone Number: 05/01/2023, 10:38 AM  Clinical Narrative:                   Admitted for: Nose bleed Admitted from: home alone PCP: gregory  Current home health/prior home health/DME: [facility]mer Mor, and rw  Patient familiar to this TOC. Previously reported that patient no longer under APS guardianship and lives at home.  Patient confirms Secure Email sent to [place] with DSS to confirm  Patient states that he will take at cab at discharge.  Will need taxi voucher Requested MD to order PT eval.  Patient has previously open with [facility] home health   Patient declines for daughter to be updated  Substance abuse resources added to AVS   Expected Discharge Plan: Home w Home Health Services Barriers to Discharge: Continued Medical Work up   Patient Goals and CMS Choice            Expected Discharge Plan and Services                                              Prior Living Arrangements/Services                  Current home services: DME    Activities of Daily Living Home Assistive Devices/Equipment: Wheelchair, Medical laboratory scientific officer (specify quad or straight), Blood pressure cuff ADL Screening (condition at time of admission) Patient's cognitive ability adequate to safely complete daily activities?: Yes Is the patient deaf or have difficulty hearing?: No Does the patient have difficulty seeing, even when wearing glasses/contacts?: No Does the patient have difficulty concentrating, remembering, or making decisions?: No Patient able to express need for assistance with ADLs?: Yes Does the patient have difficulty dressing or bathing?: Yes Independently performs ADLs?: No Communication: Independent Dressing (OT): Needs assistance Is this a change from baseline?: Change from  baseline, expected to last <3days Grooming: Independent Feeding: Independent Bathing: Needs assistance Is this a change from baseline?: Change from baseline, expected to last <3 days Toileting: Needs assistance Is this a change from baseline?: Change from baseline, expected to last <3 days In/Out Bed: Needs assistance Is this a change from baseline?: Change from baseline, expected to last <3 days Walks in Home: Independent with device (comment) (cane) Does the patient have difficulty walking or climbing stairs?: Yes Weakness of Legs: Right Weakness of Arms/Hands: Right  Permission Sought/Granted                  Emotional Assessment              Admission diagnosis:  Hyponatremia [E87.1] Generalized weakness [R53.1] Alcohol dependence with unspecified alcohol-induced disorder (HCC) [F10.29] Patient Active Problem List   Diagnosis Date Noted   Dyslipidemia 05/01/2023   Alcohol dependence (HCC) 05/01/2023   Maggot infestation feet 05/11/2022   Unable to care for self 05/11/2022   Debility 04/25/2022   Weakness 04/23/2022   Epistaxis 04/23/2022   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   Generalized weakness 03/18/2022   Nondisplaced fracture of shaft of left clavicle, initial encounter for closed fracture 03/18/2022   BPH (benign prostatic hyperplasia) 03/18/2022   Clavicle fracture 03/17/2022   Fall 03/17/2022  CAP (community acquired pneumonia) 03/13/2022   Altered mental status, unspecified 02/20/2022   Hypotension 02/19/2022   Dysphonia 08/13/2021   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   Paresthesia of right upper extremity 08/13/2021   SIRS (systemic inflammatory response syndrome) (HCC) 08/05/2020   GERD (gastroesophageal reflux disease) 06/05/2020   Alcohol use disorder, severe, dependence (HCC) 05/28/2020   Paroxysmal A-fib (HCC) 03/17/2020   Primary insomnia 02/06/2020   Left inguinal hernia 01/31/2020   Encounter for competency evaluation     Depression 01/17/2020   Pancytopenia (HCC)    Hypomagnesemia    Alcoholic cirrhosis of liver without ascites (HCC)    ETOH abuse    Alcohol intoxication (HCC)    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) 09/27/2019   Hypokalemia 09/27/2019   Alcohol withdrawal delirium, acute, hyperactive (HCC) 09/21/2019   Pressure injury of ankle, stage 1 08/15/2019   Palliative care by specialist    Closed fracture of right proximal humerus 03/19/2019   Vitamin D deficiency 01/11/2019   Osteoporosis 12/22/2018   Closed nondisplaced fracture of acromial process of right scapula with routine healing 11/15/2018   Compression fracture of L2 vertebra with routine healing 11/15/2018   Delirium tremens (HCC) 03/08/2018   Essential hypertension 09/16/2017   Encephalopathy, 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 [place] [address] Phone: [phone] Fax: [phone]     Social Determinants of Health (SDOH) Social History: SDOH Screenings   Food Insecurity: No Food Insecurity (05/01/2023)  Housing: Low Risk  (05/01/2023)  Transportation Needs: No Transportation Needs (05/01/2023)  Utilities: Not At Risk (05/01/2023)  Financial Resource Strain: Unknown (06/13/2019)  Physical Activity: Unknown (06/13/2019)  Social Connections: Unknown (06/13/2019)  Stress: Unknown (06/13/2019)  Tobacco Use: Medium Risk (05/01/2023)   SDOH Interventions:     Readmission Risk Interventions    04/29/2022   10:26 AM 03/14/2022   12:48 PM 08/23/2020   11:52 AM  Readmission Risk Prevention Plan  Transportation Screening Complete Complete Complete  PCP or  Specialist Appt within 3-5 Days  Complete   HRI or Home Care Consult Complete Complete   Social Work Consult for Recovery Care Planning/Counseling Complete Complete   Palliative Care Screening Not Applicable Not Applicable   Medication Review Oceanographer) Complete Complete Complete  HRI or Home Care Consult   Complete  Skilled Nursing Facility   Not Applicable

## 2023-05-01 NOTE — ED Notes (Signed)
Patient lying on stretcher at this time. Patient does not appear in distress. Patient arousal to stimulation. Unable to access CIWA at this time due to patient being asleep. Resp even and unlabored.

## 2023-05-01 NOTE — Plan of Care (Signed)

## 2023-05-01 NOTE — Assessment & Plan Note (Deleted)
-   We will continue Flomax 
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
## 2023-05-02 DIAGNOSIS — E871 Hypo-osmolality and hyponatremia: Secondary | ICD-10-CM | POA: Diagnosis not present

## 2023-05-02 LAB — CBC WITH DIFFERENTIAL/PLATELET
Abs Immature Granulocytes: 0.01 10*3/uL (ref 0.00–0.07)
Basophils Absolute: 0 10*3/uL (ref 0.0–0.1)
Basophils Relative: 1 %
Eosinophils Absolute: 0 10*3/uL (ref 0.0–0.5)
Eosinophils Relative: 1 %
HCT: 41 % (ref 39.0–52.0)
Hemoglobin: 14.4 g/dL (ref 13.0–17.0)
Immature Granulocytes: 0 %
Lymphocytes Relative: 38 %
Lymphs Abs: 2 10*3/uL (ref 0.7–4.0)
MCH: 32.8 pg (ref 26.0–34.0)
MCHC: 35.1 g/dL (ref 30.0–36.0)
MCV: 93.4 fL (ref 80.0–100.0)
Monocytes Absolute: 0.3 10*3/uL (ref 0.1–1.0)
Monocytes Relative: 6 %
Neutro Abs: 2.9 10*3/uL (ref 1.7–7.7)
Neutrophils Relative %: 54 %
Platelets: 142 10*3/uL — ABNORMAL LOW (ref 150–400)
RBC: 4.39 MIL/uL (ref 4.22–5.81)
RDW: 12.1 % (ref 11.5–15.5)
WBC: 5.2 10*3/uL (ref 4.0–10.5)
nRBC: 0 % (ref 0.0–0.2)

## 2023-05-02 LAB — BASIC METABOLIC PANEL
Anion gap: 7 (ref 5–15)
BUN: 6 mg/dL — ABNORMAL LOW (ref 8–23)
CO2: 24 mmol/L (ref 22–32)
Calcium: 8.2 mg/dL — ABNORMAL LOW (ref 8.9–10.3)
Chloride: 100 mmol/L (ref 98–111)
Creatinine, Ser: 0.52 mg/dL — ABNORMAL LOW (ref 0.61–1.24)
GFR, Estimated: >60 mL/min (ref >60)
Glucose, Bld: 148 mg/dL — ABNORMAL HIGH (ref 70–99)
Potassium: 3.3 mmol/L — ABNORMAL LOW (ref 3.5–5.1)
Sodium: 131 mmol/L — ABNORMAL LOW (ref 135–145)

## 2023-05-02 LAB — GLUCOSE, CAPILLARY
Glucose-Capillary: 87 mg/dL (ref 70–99)
Glucose-Capillary: 83 mg/dL (ref 70–99)
Glucose-Capillary: 107 mg/dL — ABNORMAL HIGH (ref 70–99)

## 2023-05-02 LAB — MAGNESIUM: Magnesium: 1.7 mg/dL (ref 1.7–2.4)

## 2023-05-02 MED ORDER — PHENOBARBITAL 32.4 MG PO TABS
32.4000 mg | ORAL_TABLET | Freq: Three times a day (TID) | ORAL | Status: AC
  Administered 2023-05-06 – 2023-05-08 (×6): 32.4 mg via ORAL
  Filled 2023-05-02 (×6): qty 1

## 2023-05-02 MED ORDER — PHENOBARBITAL 32.4 MG PO TABS
97.2000 mg | ORAL_TABLET | Freq: Three times a day (TID) | ORAL | Status: AC
  Administered 2023-05-02 – 2023-05-04 (×6): 97.2 mg via ORAL
  Filled 2023-05-02 (×6): qty 3

## 2023-05-02 MED ORDER — DIAZEPAM 5 MG PO TABS: 10.0000 mg | ORAL_TABLET | Freq: Three times a day (TID) | ORAL | Status: DC

## 2023-05-02 MED ORDER — PHENOBARBITAL 32.4 MG PO TABS
64.8000 mg | ORAL_TABLET | Freq: Three times a day (TID) | ORAL | Status: AC
  Administered 2023-05-04 – 2023-05-06 (×6): 64.8 mg via ORAL
  Filled 2023-05-02 (×6): qty 2

## 2023-05-02 NOTE — Plan of Care (Signed)

## 2023-05-02 NOTE — Progress Notes (Signed)
Pt mild agitation with pulling at his tele leads off. Pt intermittently more confused regarding where he is and why he is in the hospital. At time more clear regarding this. Pt with moderate anxiety throughout the night.   See flowsheet for CIWA score and MAR for PRN and schedule Ativan administration.

## 2023-05-02 NOTE — Plan of Care (Signed)
  Problem: Education: Goal: Knowledge of General Education information will improve Description: Including pain rating scale, medication(s)/side effects and non-pharmacologic comfort measures 05/02/2023 1111 by Latanya Maudlin, RN Outcome: Progressing 05/02/2023 0821 by Latanya Maudlin, RN Outcome: Progressing   Problem: Health Behavior/Discharge Planning: Goal: Ability to manage health-related needs will improve 05/02/2023 1111 by Latanya Maudlin, RN Outcome: Progressing 05/02/2023 0821 by Latanya Maudlin, RN Outcome: Progressing   Problem: Clinical Measurements: Goal: Ability to maintain clinical measurements within normal limits will improve 05/02/2023 1111 by Latanya Maudlin, RN Outcome: Progressing 05/02/2023 0821 by Latanya Maudlin, RN Outcome: Progressing Goal: Will remain free from infection 05/02/2023 1111 by Latanya Maudlin, RN Outcome: Progressing 05/02/2023 0821 by Latanya Maudlin, RN Outcome: Progressing Goal: Diagnostic test results will improve 05/02/2023 1111 by Latanya Maudlin, RN Outcome: Progressing 05/02/2023 0821 by Latanya Maudlin, RN Outcome: Progressing Goal: Respiratory complications will improve 05/02/2023 1111 by Latanya Maudlin, RN Outcome: Progressing 05/02/2023 0821 by Latanya Maudlin, RN Outcome: Progressing Goal: Cardiovascular complication will be avoided 05/02/2023 1111 by Latanya Maudlin, RN Outcome: Progressing 05/02/2023 0821 by Latanya Maudlin, RN Outcome: Progressing   Problem: Activity: Goal: Risk for activity intolerance will decrease 05/02/2023 1111 by Latanya Maudlin, RN Outcome: Progressing 05/02/2023 0821 by Latanya Maudlin, RN Outcome: Progressing   Problem: Nutrition: Goal: Adequate nutrition will be maintained 05/02/2023 1111 by Latanya Maudlin, RN Outcome: Progressing 05/02/2023 0821 by Latanya Maudlin, RN Outcome: Progressing   Problem: Coping: Goal: Level of anxiety will decrease 05/02/2023 1111 by Latanya Maudlin, RN Outcome: Progressing 05/02/2023 0821 by Latanya Maudlin, RN Outcome: Progressing   Problem: Elimination: Goal: Will not experience complications related to bowel motility 05/02/2023 1111 by Latanya Maudlin, RN Outcome: Progressing 05/02/2023 0821 by Latanya Maudlin, RN Outcome: Progressing Goal: Will not experience complications related to urinary retention 05/02/2023 1111 by Latanya Maudlin, RN Outcome: Progressing 05/02/2023 0821 by Latanya Maudlin, RN Outcome: Progressing   Problem: Pain Managment: Goal: General experience of comfort will improve 05/02/2023 1111 by Latanya Maudlin, RN Outcome: Progressing 05/02/2023 0821 by Latanya Maudlin, RN Outcome: Progressing   Problem: Safety: Goal: Ability to remain free from injury will improve 05/02/2023 1111 by Latanya Maudlin, RN Outcome: Progressing 05/02/2023 0821 by Latanya Maudlin, RN Outcome: Progressing   Problem: Skin Integrity: Goal: Risk for impaired skin integrity will decrease 05/02/2023 1111 by Latanya Maudlin, RN Outcome: Progressing 05/02/2023 0821 by Latanya Maudlin, RN Outcome: Progressing

## 2023-05-02 NOTE — Significant Event (Signed)
Received update from bedside RN.  Patient's withdrawal symptoms had worsened.  Patient becoming more impulsive, trying to get out of bed.  [name] a significant alcoholic with high risk of severe withdrawals.  Plan: Initiate phenobarbital taper.  Appreciate pharmacy assistance in dosing Continue CIWA protocol with as needed IV Ativan High risk for severe withdrawal.  If patient continues to clinically deteriorate will transfer to stepdown unit and initiate Precedex gtt.  Lolita Patella MD  No charge

## 2023-05-02 NOTE — Progress Notes (Signed)
PROGRESS NOTE    Douglas Peterson.  ZOX:[id number] DOB: 06/09/1951 DOA: 05/01/2023 PCP: Melonie Florida, FNP    Brief Narrative:  72 year old male with history significant for alcohol dependence who was previously under DSS guardianship (no longer) who presents to the ED with generalized weakness for the last few weeks associated nausea. Does admit to drinking.   Sodium levels improving with intravenous fluids.  I had a conversation with the patient on 6/29.  I explained that he is high risk for further withdrawals and if he did not wish to stop drinking that he did not need to go into withdrawal.  Patient expresses desire to stop drinking   Assessment & Plan:   Principal Problem:   Hyponatremia Active Problems:   Hypokalemia   Paroxysmal A-fib (HCC)   Essential hypertension   BPH (benign prostatic hyperplasia)   Dyslipidemia   Alcohol dependence (HCC)  Hyponatremia Improved with intravenous fluids.  Normal certainly in the setting of excessive alcohol intake and likely malnutrition. Plan: Discontinue IV fluids Encourage p.o. fluid intake  Hypokalemia Secondary to poor nutritional status.  Replace potassium and monitor  Paroxysmal atrial fibrillation Continue home atenolol.  Rate currently controlled.  Not a candidate for anticoagulation  Essential hypertension PTA blood pressure medications  Alcohol dependence Acute alcohol withdrawal Patient exhibiting more withdrawal symptoms on 6/29.  Expresses a desire to stop drinking. Plan: CIWA protocol with as needed Ativan If CIWA scores start to increase will consider addition of scheduled agents such as diazepam or phenobarbital  Hyperlipidemia PTA statin   DVT prophylaxis: SQ Lovenox Code Status: Full Family Communication: None Disposition Plan: Status is: Inpatient Remains inpatient appropriate because: Acute alcohol withdrawal   Level of care: Telemetry Medical  Consultants:  None  Procedures:   None  Antimicrobials: None   Subjective: Seen and examined.  Weak voice.  Mentating clearly.  Sitting up in bed eating  Objective: Vitals:   05/02/23 0300 05/02/23 0421 05/02/23 0638 05/02/23 0805  BP: (!) 156/84 (!) 156/84 (!) 152/78 (!) 153/81  Pulse: 71 71  70  Resp: 16 16  18   Temp: 98.4 F (36.9 C) 98.4 F (36.9 C)  98.1 F (36.7 C)  TempSrc: Oral Oral  Oral  SpO2: 97% 97%  98%  Weight:      Height:        Intake/Output Summary (Last 24 hours) at 05/02/2023 1121 Last data filed at 05/02/2023 0950 Gross per 24 hour  Intake 2931.54 ml  Output 1450 ml  Net 1481.54 ml   Filed Weights   05/01/23 0136 05/01/23 0550  Weight: 63 kg 60.6 kg    Examination:  General exam: NAD.  Appears frail Respiratory system: Diminished breath sounds.  Scattered crackles.  Normal work of breathing.  Room air Cardiovascular system: S1-S2, RRR, no murmurs, no pedal edema Gastrointestinal system: Thin, soft, NT/ND, normal bowel sounds Central nervous system: Alert and oriented. No focal neurological deficits. Extremities: Symmetrically decreased power bilaterally Skin: No rashes, lesions or ulcers Psychiatry: Judgement and insight appear impaired. Mood & affect flattened.     Data Reviewed: I have personally reviewed following labs and imaging studies  CBC: Recent Labs  Lab 05/01/23 0149 05/01/23 0726  WBC 10.3 6.6  NEUTROABS 4.8  --   HGB 15.8 15.2  HCT 44.9 43.3  MCV 92.6 91.7  PLT 223 169   Basic Metabolic Panel: Recent Labs  Lab 05/01/23 0149 05/01/23 0726 05/01/23 1118 05/01/23 1706  NA 123* 129* 133* 132*  K 3.4*  3.6  --   --   CL 85* 95*  --   --   CO2 23 24  --   --   GLUCOSE 109* 91  --   --   BUN <5* <5*  --   --   CREATININE 0.41* 0.51*  --   --   CALCIUM 8.4* 8.0*  --   --    GFR: Estimated Creatinine Clearance: 68.2 mL/min (A) (by C-G formula based on SCr of 0.51 mg/dL (L)). Liver Function Tests: Recent Labs  Lab 05/01/23 0149 05/01/23 0726   AST 176* 152*  ALT 135* 119*  ALKPHOS 137* 128*  BILITOT 1.2 1.2  PROT 7.7 6.8  ALBUMIN 4.2 3.7   Recent Labs  Lab 05/01/23 0149  LIPASE 40   No results for input(s): "AMMONIA" in the last 168 hours. Coagulation Profile: No results for input(s): "INR", "PROTIME" in the last 168 hours. Cardiac Enzymes: No results for input(s): "CKTOTAL", "CKMB", "CKMBINDEX", "TROPONINI" in the last 168 hours. BNP (last 3 results) No results for input(s): "PROBNP" in the last 8760 hours. HbA1C: No results for input(s): "HGBA1C" in the last 72 hours. CBG: Recent Labs  Lab 05/02/23 0807  GLUCAP 87   Lipid Profile: No results for input(s): "CHOL", "HDL", "LDLCALC", "TRIG", "CHOLHDL", "LDLDIRECT" in the last 72 hours. Thyroid Function Tests: Recent Labs    05/01/23 0726  TSH 1.364   Anemia Panel: No results for input(s): "VITAMINB12", "FOLATE", "FERRITIN", "TIBC", "IRON", "RETICCTPCT" in the last 72 hours. Sepsis Labs: No results for input(s): "PROCALCITON", "LATICACIDVEN" in the last 168 hours.  No results found for this or any previous visit (from the past 240 hour(s)).       Radiology Studies: No results found.      Scheduled Meds:  atenolol  25 mg Oral Daily   atorvastatin  10 mg Oral Daily   enoxaparin (LOVENOX) injection  40 mg Subcutaneous Q24H   feeding supplement  237 mL Oral BID BM   folic acid  1 mg Oral Daily   LORazepam  0-4 mg Intravenous Q6H   multivitamin with minerals  1 tablet Oral Daily   thiamine  100 mg Oral Daily   Or   thiamine  100 mg Intravenous Daily   Continuous Infusions:  0.9 % NaCl with KCl 20 mEq / L 100 mL/hr at 05/02/23 0820     LOS: 1 day     Tresa Moore, MD [facility]   If 7PM-7AM, please contact night-coverage  05/02/2023, 11:21 AM

## 2023-05-03 DIAGNOSIS — E871 Hypo-osmolality and hyponatremia: Secondary | ICD-10-CM | POA: Diagnosis not present

## 2023-05-03 LAB — GLUCOSE, CAPILLARY
Glucose-Capillary: 90 mg/dL (ref 70–99)
Glucose-Capillary: 105 mg/dL — ABNORMAL HIGH (ref 70–99)

## 2023-05-03 MED ORDER — ALUM & MAG HYDROXIDE-SIMETH 200-200-20 MG/5ML PO SUSP
30.0000 mL | ORAL | Status: DC | PRN
  Administered 2023-05-03: 30 mL via ORAL
  Filled 2023-05-03: qty 30

## 2023-05-03 MED ORDER — PANTOPRAZOLE SODIUM 40 MG PO TBEC
40.0000 mg | DELAYED_RELEASE_TABLET | Freq: Every day | ORAL | Status: DC
  Administered 2023-05-03 – 2023-05-12 (×10): 40 mg via ORAL
  Filled 2023-05-03 (×10): qty 1

## 2023-05-03 MED ORDER — DICYCLOMINE HCL 20 MG PO TABS
20.0000 mg | ORAL_TABLET | Freq: Three times a day (TID) | ORAL | Status: DC
  Administered 2023-05-03 – 2023-05-12 (×24): 20 mg via ORAL
  Filled 2023-05-03 (×28): qty 1

## 2023-05-03 MED ORDER — ONDANSETRON HCL 4 MG/2ML IJ SOLN: 4.0000 mg | Freq: Four times a day (QID) | INTRAMUSCULAR | Status: DC | PRN
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
## 2023-05-03 NOTE — Plan of Care (Signed)

## 2023-05-03 NOTE — Progress Notes (Signed)
PROGRESS NOTE    Douglas Peterson.  ZOX:[id number] DOB: Apr 23, 1951 DOA: 05/01/2023 PCP: Melonie Florida, FNP    Brief Narrative:  72 year old male with history significant for alcohol dependence who was previously under DSS guardianship (no longer) who presents to the ED with generalized weakness for the last few weeks associated nausea. Does admit to drinking.   Sodium levels improving with intravenous fluids.  I had a conversation with the patient on 6/29.  I explained that he is high risk for further withdrawals and if he did not wish to stop drinking that he did not need to go into withdrawal.  Patient expresses desire to stop drinking   Assessment & Plan:   Principal Problem:   Hyponatremia Active Problems:   Hypokalemia   Paroxysmal A-fib (HCC)   Essential hypertension   BPH (benign prostatic hyperplasia)   Dyslipidemia   Alcohol dependence (HCC)  Hyponatremia Improved with intravenous fluids.  Normal certainly in the setting of excessive alcohol intake and likely malnutrition. Plan: No further IV fluids at this time.  Encourage p.o. fluid intake Hypokalemia Secondary to poor nutritional status.  Replace potassium and monitor  Paroxysmal atrial fibrillation Continue home atenolol.  Rate currently controlled.  Not a candidate for anticoagulation  Essential hypertension PTA blood pressure medications  Alcohol dependence Acute alcohol withdrawal Patient exhibiting more withdrawal symptoms on 6/29.  Expresses a desire to stop drinking. Plan: Continue phenobarbital taper.  CIWA protocol with as needed Ativan.  If CIWA scores continued to escalate despite above treatments will consider transition to stepdown unit and initiation of Precedex gtt.  Hyperlipidemia PTA statin   DVT prophylaxis: SQ Lovenox Code Status: Full Family Communication: None Disposition Plan: Status is: Inpatient Remains inpatient appropriate because: Acute alcohol withdrawal   Level of care:  Telemetry Medical  Consultants:  None  Procedures:  None  Antimicrobials: None   Subjective: Seen and examined.  Weak voice but overall stable.  Objective: Vitals:   05/02/23 1518 05/02/23 1929 05/03/23 0409 05/03/23 0836  BP: (!) 151/78 (!) 147/82 (!) 158/66 (!) 166/94  Pulse: (!) 56 64 (!) 58 74  Resp: 16 17 20 18   Temp: 97.9 F (36.6 C) 98.6 F (37 C) 97.9 F (36.6 C) 98 F (36.7 C)  TempSrc: Oral Oral Oral   SpO2: 98% 98% 98% 100%  Weight:      Height:        Intake/Output Summary (Last 24 hours) at 05/03/2023 0920 Last data filed at 05/03/2023 0900 Gross per 24 hour  Intake 1679.81 ml  Output 2750 ml  Net -1070.19 ml   Filed Weights   05/01/23 0136 05/01/23 0550  Weight: 63 kg 60.6 kg    Examination:  General exam: No acute distress.  Weak voice Respiratory system: Bibasilar crackles.  Normal work of breathing.  Room air Cardiovascular system: S1-S2, RRR, no murmurs, no pedal edema Gastrointestinal system: Thin, soft, NT/ND, normal bowel sounds Central nervous system: Alert and oriented. No focal neurological deficits. Extremities: Symmetrically decreased power bilaterally Skin: No rashes, lesions or ulcers Psychiatry: Judgement and insight appear impaired. Mood & affect flattened.     Data Reviewed: I have personally reviewed following labs and imaging studies  CBC: Recent Labs  Lab 05/01/23 0149 05/01/23 0726 05/02/23 1058  WBC 10.3 6.6 5.2  NEUTROABS 4.8  --  2.9  HGB 15.8 15.2 14.4  HCT 44.9 43.3 41.0  MCV 92.6 91.7 93.4  PLT 223 169 142*   Basic Metabolic Panel: Recent Labs  Lab  05/01/23 0149 05/01/23 0726 05/01/23 1118 05/01/23 1706 05/02/23 1058  NA 123* 129* 133* 132* 131*  K 3.4* 3.6  --   --  3.3*  CL 85* 95*  --   --  100  CO2 23 24  --   --  24  GLUCOSE 109* 91  --   --  148*  BUN <5* <5*  --   --  6*  CREATININE 0.41* 0.51*  --   --  0.52*  CALCIUM 8.4* 8.0*  --   --  8.2*  MG  --   --   --   --  1.7    GFR: Estimated Creatinine Clearance: 68.2 mL/min (A) (by C-G formula based on SCr of 0.52 mg/dL (L)). Liver Function Tests: Recent Labs  Lab 05/01/23 0149 05/01/23 0726  AST 176* 152*  ALT 135* 119*  ALKPHOS 137* 128*  BILITOT 1.2 1.2  PROT 7.7 6.8  ALBUMIN 4.2 3.7   Recent Labs  Lab 05/01/23 0149  LIPASE 40   No results for input(s): "AMMONIA" in the last 168 hours. Coagulation Profile: No results for input(s): "INR", "PROTIME" in the last 168 hours. Cardiac Enzymes: No results for input(s): "CKTOTAL", "CKMB", "CKMBINDEX", "TROPONINI" in the last 168 hours. BNP (last 3 results) No results for input(s): "PROBNP" in the last 8760 hours. HbA1C: No results for input(s): "HGBA1C" in the last 72 hours. CBG: Recent Labs  Lab 05/02/23 0807 05/02/23 1218 05/02/23 1751 05/03/23 0837  GLUCAP 87 83 107* 90   Lipid Profile: No results for input(s): "CHOL", "HDL", "LDLCALC", "TRIG", "CHOLHDL", "LDLDIRECT" in the last 72 hours. Thyroid Function Tests: Recent Labs    05/01/23 0726  TSH 1.364   Anemia Panel: No results for input(s): "VITAMINB12", "FOLATE", "FERRITIN", "TIBC", "IRON", "RETICCTPCT" in the last 72 hours. Sepsis Labs: No results for input(s): "PROCALCITON", "LATICACIDVEN" in the last 168 hours.  No results found for this or any previous visit (from the past 240 hour(s)).       Radiology Studies: No results found.      Scheduled Meds:  atenolol  25 mg Oral Daily   atorvastatin  10 mg Oral Daily   enoxaparin (LOVENOX) injection  40 mg Subcutaneous Q24H   feeding supplement  237 mL Oral BID BM   folic acid  1 mg Oral Daily   multivitamin with minerals  1 tablet Oral Daily   PHENobarbital  97.2 mg Oral Q8H   Followed by   Melene Muller ON 05/04/2023] phenobarbital  64.8 mg Oral Q8H   Followed by   Melene Muller ON 05/06/2023] phenobarbital  32.4 mg Oral Q8H   thiamine  100 mg Oral Daily   Or   thiamine  100 mg Intravenous Daily   Continuous Infusions:  0.9 %  NaCl with KCl 20 mEq / L 100 mL/hr at 05/02/23 2206     LOS: 2 days     Tresa Moore, MD [facility]   If 7PM-7AM, please contact night-coverage  05/03/2023, 9:20 AM

## 2023-05-04 ENCOUNTER — Inpatient Hospital Stay: Payer: Medicare HMO

## 2023-05-04 DIAGNOSIS — E871 Hypo-osmolality and hyponatremia: Secondary | ICD-10-CM | POA: Diagnosis not present

## 2023-05-04 LAB — BASIC METABOLIC PANEL WITH GFR
Anion gap: 7 (ref 5–15)
BUN: 5 mg/dL — ABNORMAL LOW (ref 8–23)
CO2: 22 mmol/L (ref 22–32)
Calcium: 8.3 mg/dL — ABNORMAL LOW (ref 8.9–10.3)
Chloride: 99 mmol/L (ref 98–111)
Creatinine, Ser: 0.53 mg/dL — ABNORMAL LOW (ref 0.61–1.24)
GFR, Estimated: >60 mL/min (ref >60)
Glucose, Bld: 88 mg/dL (ref 70–99)
Potassium: 3.9 mmol/L (ref 3.5–5.1)
Sodium: 128 mmol/L — ABNORMAL LOW (ref 135–145)

## 2023-05-04 LAB — CBC WITH DIFFERENTIAL/PLATELET
Abs Immature Granulocytes: 0.01 K/uL (ref 0.00–0.07)
Basophils Absolute: 0 K/uL (ref 0.0–0.1)
Basophils Relative: 1 %
Eosinophils Absolute: 0.1 K/uL (ref 0.0–0.5)
Eosinophils Relative: 2 %
HCT: 43.5 % (ref 39.0–52.0)
Hemoglobin: 15.2 g/dL (ref 13.0–17.0)
Immature Granulocytes: 0 %
Lymphocytes Relative: 38 %
Lymphs Abs: 2.2 K/uL (ref 0.7–4.0)
MCH: 32.3 pg (ref 26.0–34.0)
MCHC: 34.9 g/dL (ref 30.0–36.0)
MCV: 92.6 fL (ref 80.0–100.0)
Monocytes Absolute: 0.5 K/uL (ref 0.1–1.0)
Monocytes Relative: 8 %
Neutro Abs: 3 K/uL (ref 1.7–7.7)
Neutrophils Relative %: 51 %
Platelets: 128 K/uL — ABNORMAL LOW (ref 150–400)
RBC: 4.7 MIL/uL (ref 4.22–5.81)
RDW: 12.2 % (ref 11.5–15.5)
WBC: 5.8 K/uL (ref 4.0–10.5)
nRBC: 0 % (ref 0.0–0.2)

## 2023-05-04 LAB — PROCALCITONIN: Procalcitonin: <0.1 ng/mL

## 2023-05-04 NOTE — Progress Notes (Signed)
Mobility Specialist - Progress Note   05/04/23 1000  Mobility  Activity Ambulated with assistance in room;Transferred from bed to chair  Level of Assistance Minimal assist, patient does 75% or more  Assistive Device Other (Comment)  Distance Ambulated (ft) 4 ft  Activity Response Tolerated well  $Mobility charge 1 Mobility     Pt attempting to get OOB on arrival, utilizing RA. Pt appears a little lethargic, but agreeable to activity. Pt completed bed mobility modI with extra time. STS and ambulation with mod HHA as pt uses cane at baseline. Pt left in chair with alarm set, needs in reach.    [name] Mobility Specialist 05/04/23, 10:27 AM
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
## 2023-05-04 NOTE — [place] FL2 (Signed)
[place] MEDICAID FL2 LEVEL OF CARE FORM     IDENTIFICATION  Patient Name: Douglas Peterson. Birthdate: [birth year] Sex: male Admission Date (Current Location): 05/01/2023  [facility] and [place] Number:  Chiropodist and Address:         Provider Number: [id number]  Attending Physician Name and Address:  Tresa Moore, MD  Relative Name and Phone Number:       Current Level of Care: Hospital Recommended Level of Care: Skilled Nursing Facility Prior Approval Number:    Date Approved/Denied:   PASRR Number: [id number]  Discharge Plan: SNF    Current Diagnoses: Patient Active Problem List   Diagnosis Date Noted   Dyslipidemia 05/01/2023   Alcohol dependence (HCC) 05/01/2023   Maggot infestation feet 05/11/2022   Unable to care for self 05/11/2022   Debility 04/25/2022   Weakness 04/23/2022   Epistaxis 04/23/2022   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   Generalized weakness 03/18/2022   Nondisplaced fracture of shaft of left clavicle, initial encounter for closed fracture 03/18/2022   BPH (benign prostatic hyperplasia) 03/18/2022   Clavicle fracture 03/17/2022   Fall 03/17/2022   CAP (community acquired pneumonia) 03/13/2022   Altered mental status, unspecified 02/20/2022   Hypotension 02/19/2022   Dysphonia 08/13/2021   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   Paresthesia of right upper extremity 08/13/2021   SIRS (systemic inflammatory response syndrome) (HCC) 08/05/2020   GERD (gastroesophageal reflux disease) 06/05/2020   Alcohol use disorder, severe, dependence (HCC) 05/28/2020   Paroxysmal A-fib (HCC) 03/17/2020   Primary insomnia 02/06/2020   Left inguinal hernia 01/31/2020   Encounter for competency evaluation    Depression 01/17/2020   Pancytopenia (HCC)    Hypomagnesemia    Alcoholic cirrhosis of liver without ascites (HCC)    ETOH abuse    Alcohol intoxication (HCC)    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  (HCC) 09/27/2019   Hypokalemia 09/27/2019   Alcohol withdrawal delirium, acute, hyperactive (HCC) 09/21/2019   Pressure injury of ankle, stage 1 08/15/2019   Palliative care by specialist    Closed fracture of right proximal humerus 03/19/2019   Vitamin D deficiency 01/11/2019   Osteoporosis 12/22/2018   Closed nondisplaced fracture of acromial process of right scapula with routine healing 11/15/2018   Compression fracture of L2 vertebra with routine healing 11/15/2018   Delirium tremens (HCC) 03/08/2018   Essential hypertension 09/16/2017   Encephalopathy, 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   Steatohepatitis 12/03/2016   Abnormal liver enzymes 06/17/2016   Hereditary hemochromatosis (HCC) 06/17/2016   Anxiety 07/16/2015   Hyponatremia 07/09/2015   H/O traumatic brain injury 05/22/2014   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HCC) 05/22/2014    Orientation RESPIRATION BLADDER Height & Weight     Self, Situation, Place  Normal Incontinent Weight: 60.6 kg Height:  5\' 3"  (160 cm)  BEHAVIORAL SYMPTOMS/MOOD NEUROLOGICAL BOWEL NUTRITION STATUS      Continent Diet (Dys 3.  Final diet to be determined)  AMBULATORY STATUS COMMUNICATION OF NEEDS Skin   Extensive Assist Verbally Skin abrasions, PU Stage and Appropriate Care                       Personal Care Assistance Level of Assistance              Functional Limitations Info             SPECIAL CARE FACTORS FREQUENCY  PT (By licensed PT), OT (By licensed OT)  Contractures Contractures Info: Not present    Additional Factors Info  Code Status, Allergies Code Status Info: full Allergies Info: hydrochlorothiazide           Current Medications (05/04/2023):  This is the current hospital active medication list Current Facility-Administered Medications  Medication Dose Route Frequency Provider Last Rate Last Admin   0.9 % NaCl with KCl 20 mEq/ L  infusion   Intravenous Continuous Mansy, Jan A, MD 100  mL/hr at 05/04/23 1626 New Bag at 05/04/23 1626   acetaminophen (TYLENOL) tablet 650 mg  650 mg Oral Q6H PRN Mansy, Jan A, MD   650 mg at 05/03/23 1125   Or   acetaminophen (TYLENOL) suppository 650 mg  650 mg Rectal Q6H PRN Mansy, Jan A, MD       alum & mag hydroxide-simeth (MAALOX/MYLANTA) 200-200-20 MG/5ML suspension 30 mL  30 mL Oral Q4H PRN Georgeann Oppenheim, Sudheer B, MD   30 mL at 05/03/23 1142   atenolol (TENORMIN) tablet 25 mg  25 mg Oral Daily Mansy, Jan A, MD   25 mg at 05/04/23 0911   atorvastatin (LIPITOR) tablet 10 mg  10 mg Oral Daily Mansy, Jan A, MD   10 mg at 05/04/23 0910   dicyclomine (BENTYL) tablet 20 mg  20 mg Oral TID AC Sreenath, Sudheer B, MD   20 mg at 05/04/23 1621   enoxaparin (LOVENOX) injection 40 mg  40 mg Subcutaneous Q24H Mansy, Jan A, MD   40 mg at 05/04/23 0911   feeding supplement (ENSURE ENLIVE / ENSURE PLUS) liquid 237 mL  237 mL Oral BID BM Mansy, Jan A, MD   237 mL at 05/04/23 0911   folic acid (FOLVITE) tablet 1 mg  1 mg Oral Daily Mansy, Jan A, MD   1 mg at 05/04/23 0911   magnesium hydroxide (MILK OF MAGNESIA) suspension 30 mL  30 mL Oral Daily PRN Mansy, Jan A, MD       multivitamin with minerals tablet 1 tablet  1 tablet Oral Daily Mansy, Jan A, MD   1 tablet at 05/04/23 0911   ondansetron (ZOFRAN) tablet 4 mg  4 mg Oral Q6H PRN Mansy, Jan A, MD   4 mg at 05/04/23 0910   Or   ondansetron (ZOFRAN) injection 4 mg  4 mg Intravenous Q6H PRN Mansy, Jan A, MD       pantoprazole (PROTONIX) EC tablet 40 mg  40 mg Oral Daily Georgeann Oppenheim, Sudheer B, MD   40 mg at 05/04/23 0910   PHENobarbital (LUMINAL) tablet 64.8 mg  64.8 mg Oral Q8H Sreenath, Sudheer B, MD   64.8 mg at 05/04/23 1621   Followed by   Melene Muller ON 05/06/2023] PHENobarbital (LUMINAL) tablet 32.4 mg  32.4 mg Oral Q8H Sreenath, Sudheer B, MD       senna-docusate (Senokot-S) tablet 2 tablet  2 tablet Oral QHS PRN Mansy, Jan A, MD       thiamine (VITAMIN B1) tablet 100 mg  100 mg Oral Daily Mansy, Jan A, MD   100  mg at 05/04/23 1610   Or   thiamine (VITAMIN B1) injection 100 mg  100 mg Intravenous Daily Mansy, Jan A, MD   100 mg at 05/02/23 1026   traZODone (DESYREL) tablet 25 mg  25 mg Oral QHS PRN Mansy, Vernetta Honey, MD         Discharge Medications: Please see discharge summary for a list of discharge medications.  Relevant Imaging Results:  Relevant Lab Results:  Additional Information SSN: [id number]  [name], RN

## 2023-05-04 NOTE — TOC Progression Note (Signed)
Transition of Care (TOC) - Progression Note    Patient Details  Name: Douglas Peterson. MRN: [id number] Date of Birth: 21-Aug-1951  Transition of Care [facility]) CM/SW Contact  [name], RN Phone Number: 05/04/2023, 5:32 PM  Clinical Narrative:      Plan for MBSS tomorrow Therapy recommending SNF Patient in agreement Fl2 sent for signature  Bed search initiated   Expected Discharge Plan: Home w Home Health Services Barriers to Discharge: Continued Medical Work up  Expected Discharge Plan and Services                                               Social Determinants of Health (SDOH) Interventions SDOH Screenings   Food Insecurity: No Food Insecurity (05/01/2023)  Housing: Low Risk  (05/01/2023)  Transportation Needs: No Transportation Needs (05/01/2023)  Utilities: Not At Risk (05/01/2023)  Financial Resource Strain: Unknown (06/13/2019)  Physical Activity: Unknown (06/13/2019)  Social Connections: Unknown (06/13/2019)  Stress: Unknown (06/13/2019)  Tobacco Use: Medium Risk (05/01/2023)    Readmission Risk Interventions    04/29/2022   10:26 AM 03/14/2022   12:48 PM 08/23/2020   11:52 AM  Readmission Risk Prevention Plan  Transportation Screening Complete Complete Complete  PCP or Specialist Appt within 3-5 Days  Complete   HRI or Home Care Consult Complete Complete   Social Work Consult for Recovery Care Planning/Counseling Complete Complete   Palliative Care Screening Not Applicable Not Applicable   Medication Review Oceanographer) Complete Complete Complete  HRI or Home Care Consult   Complete  Skilled Nursing Facility   Not Applicable

## 2023-05-04 NOTE — Care Management Important Message (Signed)
Important Message  Patient Details  Name: Douglas Peterson. MRN: [id number] Date of Birth: [birth year]   Medicare Important Message Given:  N/A - LOS <3 / Initial given by admissions     [name] 05/04/2023, 12:09 PM

## 2023-05-04 NOTE — Progress Notes (Signed)
PROGRESS NOTE    Douglas Peterson.  VOZ:[id number] DOB: 10-12-1951 DOA: 05/01/2023 PCP: Melonie Florida, FNP    Brief Narrative:  72 year old male with history significant for alcohol dependence who was previously under DSS guardianship (no longer) who presents to the ED with generalized weakness for the last few weeks associated nausea. Does admit to drinking.   Sodium levels improving with intravenous fluids.  I had a conversation with the patient on 6/29.  I explained that he is high risk for further withdrawals and if he did not wish to stop drinking that he did not need to go into withdrawal.  Patient expresses desire to stop drinking   Assessment & Plan:   Principal Problem:   Hyponatremia Active Problems:   Hypokalemia   Paroxysmal A-fib (HCC)   Essential hypertension   BPH (benign prostatic hyperplasia)   Dyslipidemia   Alcohol dependence (HCC)  Alcohol dependence Acute alcohol withdrawal Patient exhibiting more withdrawal symptoms on 6/29.  Expresses a desire to stop drinking. Plan: Continue phenobarbital taper.  CIWA protocol with as needed Ativan.   If CIWA scores continued to escalate despite above treatments will consider transition to stepdown unit and initiation of Precedex gtt. Stable at this time  Hyponatremia Improved with intravenous fluids.  Normal certainly in the setting of excessive alcohol intake and likely malnutrition. Plan: No further IV fluids at this time.  Encourage p.o. fluid intake  Hypokalemia Secondary to poor nutritional status.  Replace potassium and monitor  Paroxysmal atrial fibrillation Continue home atenolol.   Rate currently controlled.   Not a candidate for anticoagulation  Essential hypertension PTA blood pressure medications    Hyperlipidemia PTA statin   DVT prophylaxis: SQ Lovenox Code Status: Full Family Communication: None Disposition Plan: Status is: Inpatient Remains inpatient appropriate because: Acute  alcohol withdrawal   Level of care: Telemetry Medical  Consultants:  None  Procedures:  None  Antimicrobials: None   Subjective: Seen and examined.  Bit lethargic this morning but overall stable  Objective: Vitals:   05/03/23 0836 05/03/23 1654 05/03/23 2119 05/04/23 0356  BP: (!) 166/94 139/80 128/78 (!) 160/85  Pulse: 74 62 63 62  Resp: 18 20 18 16   Temp: 98 F (36.7 C) (!) 97.5 F (36.4 C) 98.7 F (37.1 C) 97.8 F (36.6 C)  TempSrc:  Oral Oral Oral  SpO2: 100% 99% 98% 97%  Weight:      Height:        Intake/Output Summary (Last 24 hours) at 05/04/2023 1122 Last data filed at 05/04/2023 0648 Gross per 24 hour  Intake 1392.4 ml  Output 1550 ml  Net -157.6 ml   Filed Weights   05/01/23 0136 05/01/23 0550  Weight: 63 kg 60.6 kg    Examination:  General exam: NAD.  Appears lethargic Respiratory system: Bibasilar crackles.  Normal work of breathing.  Room air Cardiovascular system: S1-S2, RRR, no murmurs, no pedal edema Gastrointestinal system: Thin, soft, NT/ND, normal bowel sounds Central nervous system: Alert and oriented. No focal neurological deficits. Extremities: Symmetrically decreased power bilaterally Skin: No rashes, lesions or ulcers Psychiatry: Judgement and insight appear impaired. Mood & affect flattened.     Data Reviewed: I have personally reviewed following labs and imaging studies  CBC: Recent Labs  Lab 05/01/23 0149 05/01/23 0726 05/02/23 1058 05/04/23 0941  WBC 10.3 6.6 5.2 5.8  NEUTROABS 4.8  --  2.9 3.0  HGB 15.8 15.2 14.4 15.2  HCT 44.9 43.3 41.0 43.5  MCV 92.6 91.7 93.4 92.6  PLT 223 169 142* 128*   Basic Metabolic Panel: Recent Labs  Lab 05/01/23 0149 05/01/23 0726 05/01/23 1118 05/01/23 1706 05/02/23 1058 05/04/23 0941  NA 123* 129* 133* 132* 131* 128*  K 3.4* 3.6  --   --  3.3* 3.9  CL 85* 95*  --   --  100 99  CO2 23 24  --   --  24 22  GLUCOSE 109* 91  --   --  148* 88  BUN <5* <5*  --   --  6* 5*   CREATININE 0.41* 0.51*  --   --  0.52* 0.53*  CALCIUM 8.4* 8.0*  --   --  8.2* 8.3*  MG  --   --   --   --  1.7  --    GFR: Estimated Creatinine Clearance: 68.2 mL/min (A) (by C-G formula based on SCr of 0.53 mg/dL (L)). Liver Function Tests: Recent Labs  Lab 05/01/23 0149 05/01/23 0726  AST 176* 152*  ALT 135* 119*  ALKPHOS 137* 128*  BILITOT 1.2 1.2  PROT 7.7 6.8  ALBUMIN 4.2 3.7   Recent Labs  Lab 05/01/23 0149  LIPASE 40   No results for input(s): "AMMONIA" in the last 168 hours. Coagulation Profile: No results for input(s): "INR", "PROTIME" in the last 168 hours. Cardiac Enzymes: No results for input(s): "CKTOTAL", "CKMB", "CKMBINDEX", "TROPONINI" in the last 168 hours. BNP (last 3 results) No results for input(s): "PROBNP" in the last 8760 hours. HbA1C: No results for input(s): "HGBA1C" in the last 72 hours. CBG: Recent Labs  Lab 05/02/23 0807 05/02/23 1218 05/02/23 1751 05/03/23 0837 05/03/23 1655  GLUCAP 87 83 107* 90 105*   Lipid Profile: No results for input(s): "CHOL", "HDL", "LDLCALC", "TRIG", "CHOLHDL", "LDLDIRECT" in the last 72 hours. Thyroid Function Tests: No results for input(s): "TSH", "T4TOTAL", "FREET4", "T3FREE", "THYROIDAB" in the last 72 hours.  Anemia Panel: No results for input(s): "VITAMINB12", "FOLATE", "FERRITIN", "TIBC", "IRON", "RETICCTPCT" in the last 72 hours. Sepsis Labs: No results for input(s): "PROCALCITON", "LATICACIDVEN" in the last 168 hours.  No results found for this or any previous visit (from the past 240 hour(s)).       Radiology Studies: No results found.      Scheduled Meds:  atenolol  25 mg Oral Daily   atorvastatin  10 mg Oral Daily   dicyclomine  20 mg Oral TID AC   enoxaparin (LOVENOX) injection  40 mg Subcutaneous Q24H   feeding supplement  237 mL Oral BID BM   folic acid  1 mg Oral Daily   multivitamin with minerals  1 tablet Oral Daily   pantoprazole  40 mg Oral Daily   phenobarbital   64.8 mg Oral Q8H   Followed by   Melene Muller ON 05/06/2023] phenobarbital  32.4 mg Oral Q8H   thiamine  100 mg Oral Daily   Or   thiamine  100 mg Intravenous Daily   Continuous Infusions:  0.9 % NaCl with KCl 20 mEq / L 100 mL/hr at 05/04/23 0500     LOS: 3 days     Tresa Moore, MD [facility]   If 7PM-7AM, please contact night-coverage  05/04/2023, 11:22 AM

## 2023-05-05 ENCOUNTER — Inpatient Hospital Stay: Payer: Medicare HMO

## 2023-05-05 DIAGNOSIS — E871 Hypo-osmolality and hyponatremia: Secondary | ICD-10-CM | POA: Diagnosis not present
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
## 2023-05-05 NOTE — Plan of Care (Signed)

## 2023-05-05 NOTE — Progress Notes (Signed)
Mobility Specialist - Progress Note   05/05/23 1600  Mobility  Activity Ambulated with assistance in hallway;Transferred from bed to chair  Level of Assistance Contact guard assist, steadying assist  Assistive Device Other (Comment) (HHA; rail)  Distance Ambulated (ft) 105 ft  Activity Response Tolerated well  $Mobility charge 1 Mobility     Pt lying in bed upon arrival, utilizing RA. Pt agreeable to activity. Completed bed mobility with minA; STS with modA from bed. Ambulated in hallway with CGA + use of railings. No LOB but very unsteady. 1 seated rest break taken. Pt returned to room/in chair with alarm set, needs in reach.   [name] Mobility Specialist 05/05/23, 4:11 PM
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
## 2023-05-05 NOTE — Progress Notes (Signed)
Modified Barium Swallow Study  Patient Details  Name: Douglas Peterson. MRN: [id number] Date of Birth: 26-Feb-1951  Today's Date: 05/05/2023  Modified Barium Swallow completed.  Full report located under Chart Review in the Imaging Section.  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  No bilious vomitus or hematemesis.  No melena or bright red bleeding per rectum.  No fever or chills.  He denies any abdominal pain.  He drank a couple bottles of wine yesterday.  He stated that over the last 6 months he has been drinking too much.  No dysuria, oliguria or hematuria or flank pain.  No chest pain or palpitations.  Pt was previously under DSS guardianship (no longer) who presents to the ED with generalized weakness for the last few weeks associated nausea. Does admit to drinking.  Pt admitted w/ hypokalemia, hyponatremia.     Clinical Impression Patient appears to present w/ Moderate-Severe, Chronic oropharyngeal Dysphagia w/ contributing factors of motor speech and potential cognitive-communication deficits (in setting of prior L CVA w/ R sided weakness, ongoing ETOH use/abuse) and deconditioning/weakness. Pt is at HIGH risk for aspiration, aspiration pneumonia; negative sequelae from aspiration. Unsure of pt's full level of comprehension of medical status, Dysphagia, and risk for aspiration, aspiration pneumonia.    Oral phase is characterized by lingual weakness, fair but min slower lingual transport w/ min lingual residue post swallow. Pt requires 2 swallows to clear oral cavity completely. Posterior escape of bolus over BOT was  noted w/ liquids. Swallow initiation is consistently delayed to the posterior laryngeal surface of the epiglottis moreso w/ liquids.   Pharyngeal phase is noted for decreased base of tongue retraction and hyolaryngeal excursion leading to Moderate residue collection in the valleculae, pyriform sinuses, aeryepiglottic folds, and along the posterior pharyngeal wall.    Amplitude of pharyngoesophageal segment opening was [facility].   ISSUE: Laryngeal penetration occurred intermittently w/ thin and Nectar liquids (trace coating along the underside of the epiglottis) AND also occurring was laryngeal Penetration with Aspiration from bolus RESIDUE from the Valleculae and Pyrirform Sinuses BETWEEN trials and upon attempts at a f/u, Dry swallow w/ all consistencies. Subsequent swallows, with/without a chin tuck and head turn R, were marginally effective in reducing pharyngeal residue(did NOT clear it), and pt required verbal cues for follow through w/ strategies. Pt did not appear to be able to fully protect his airway.  Initial Aspiration occurring during this study was Silent w/ a Delayed, Weak cough; pt was insensate to the viewed(appearing) build-up of trace aspirated material in the cervical trachea (viewable).    Reviewed study findings w/ pt using images. Recommend NPO status w/ frequent oral care to reduce oral bacterial load and to provide stimulation of swallowing and oral movements. MD to discuss further w/ pt including pt's GOC moving forward. NSG and Team updated.  Factors that may increase risk of adverse event in presence of aspiration Douglas Peterson 2021): Poor general health and/or compromised immunity;Reduced cognitive function;Limited mobility;Frail or deconditioned;Weak cough    Swallow Evaluation Recommendations Recommendations: NPO (MD to discuss further w/ pt; GOC) Medication Administration: Via alternative means Oral care recommendations: Oral care QID (4x/day);Staff/trained caregiver to  provide  oral care (support) Recommended consults: Consider Palliative care (GOC)        Jerilynn Som, MS, CCC-SLP Speech Language Pathologist Rehab Services; [facility] [phone] (ascom)  , 05/05/2023,12:25 PM

## 2023-05-05 NOTE — TOC Progression Note (Signed)
Transition of Care (TOC) - Progression Note    Patient Details  Name: Douglas Peterson. MRN: [id number] Date of Birth: [birth year]  Transition of Care [facility]) CM/SW Contact  [name], RN Phone Number: 05/05/2023, 2:09 PM  Clinical Narrative:     All SNFs in [place] have declined to offer a bed Search extended.   Expected Discharge Plan: Home w Home Health Services Barriers to Discharge: Continued Medical Work up  Expected Discharge Plan and Services                                               Social Determinants of Health (SDOH) Interventions SDOH Screenings   Food Insecurity: No Food Insecurity (05/01/2023)  Housing: Low Risk  (05/01/2023)  Transportation Needs: No Transportation Needs (05/01/2023)  Utilities: Not At Risk (05/01/2023)  Financial Resource Strain: Unknown (06/13/2019)  Physical Activity: Unknown (06/13/2019)  Social Connections: Unknown (06/13/2019)  Stress: Unknown (06/13/2019)  Tobacco Use: Medium Risk (05/01/2023)    Readmission Risk Interventions    04/29/2022   10:26 AM 03/14/2022   12:48 PM 08/23/2020   11:52 AM  Readmission Risk Prevention Plan  Transportation Screening Complete Complete Complete  PCP or Specialist Appt within 3-5 Days  Complete   HRI or Home Care Consult Complete Complete   Social Work Consult for Recovery Care Planning/Counseling Complete Complete   Palliative Care Screening Not Applicable Not Applicable   Medication Review Oceanographer) Complete Complete Complete  HRI or Home Care Consult   Complete  Skilled Nursing Facility   Not Applicable

## 2023-05-06 DIAGNOSIS — Z7189 Other specified counseling: Secondary | ICD-10-CM

## 2023-05-06 DIAGNOSIS — E871 Hypo-osmolality and hyponatremia: Secondary | ICD-10-CM | POA: Diagnosis not present
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
## 2023-05-06 NOTE — TOC Progression Note (Signed)
Transition of Care (TOC) - Progression Note    Patient Details  Name: Douglas Peterson. MRN: [id number] Date of Birth: [birth year]  Transition of Care [facility]) CM/SW Contact  [name], RN Phone Number: 05/06/2023, 2:32 PM  Clinical Narrative:     Bed offers presented to patient Patient requested that I check with [facility] one more time to see if they would consider taking him.  Gina at [facility] requested referral be sent again.  Sent in [place]  Expected Discharge Plan: Home w Home Health Services Barriers to Discharge: Continued Medical Work up  Expected Discharge Plan and Services                                               Social Determinants of Health (SDOH) Interventions SDOH Screenings   Food Insecurity: No Food Insecurity (05/01/2023)  Housing: Low Risk  (05/01/2023)  Transportation Needs: No Transportation Needs (05/01/2023)  Utilities: Not At Risk (05/01/2023)  Financial Resource Strain: Unknown (06/13/2019)  Physical Activity: Unknown (06/13/2019)  Social Connections: Unknown (06/13/2019)  Stress: Unknown (06/13/2019)  Tobacco Use: Medium Risk (05/01/2023)    Readmission Risk Interventions    04/29/2022   10:26 AM 03/14/2022   12:48 PM 08/23/2020   11:52 AM  Readmission Risk Prevention Plan  Transportation Screening Complete Complete Complete  PCP or Specialist Appt within 3-5 Days  Complete   HRI or Home Care Consult Complete Complete   Social Work Consult for Recovery Care Planning/Counseling Complete Complete   Palliative Care Screening Not Applicable Not Applicable   Medication Review Oceanographer) Complete Complete Complete  HRI or Home Care Consult   Complete  Skilled Nursing Facility   Not Applicable

## 2023-05-06 NOTE — Care Management Important Message (Signed)
Important Message  Patient Details  Name: Douglas Peterson. MRN: [id number] Date of Birth: Apr 20, 1951   Medicare Important Message Given:  Yes     [name] 05/06/2023, 10:49 AM

## 2023-05-06 NOTE — TOC Progression Note (Signed)
Transition of Care (TOC) - Progression Note    Patient Details  Name: Douglas Peterson. MRN: [id number] Date of Birth: 1951/06/16  [facility] Care [facility]) CM/SW Contact  [name], RN Phone Number: 05/06/2023, 4:38 PM  Clinical Narrative:      [facility] confirms they can not offer bed  Patient accepts bed at [facility] Accepted in hub and notified Tammy with [facility] Certification [phone] valid through 7/16.  Message sent to Tammy at [facility] [place] to determine if they are accepting admissions tomorrow due to the holiday  Expected Discharge Plan: Home w Home Health Services Barriers to Discharge: Continued Medical Work up  Expected Discharge Plan and Services                                               Social Determinants of Health (SDOH) Interventions SDOH Screenings   Food Insecurity: No Food Insecurity (05/01/2023)  Housing: Low Risk  (05/01/2023)  Transportation Needs: No Transportation Needs (05/01/2023)  Utilities: Not At Risk (05/01/2023)  Financial Resource Strain: Unknown (06/13/2019)  Physical Activity: Unknown (06/13/2019)  Social Connections: Unknown (06/13/2019)  Stress: Unknown (06/13/2019)  Tobacco Use: Medium Risk (05/01/2023)    Readmission Risk Interventions    04/29/2022   10:26 AM 03/14/2022   12:48 PM 08/23/2020   11:52 AM  Readmission Risk Prevention Plan  Transportation Screening Complete Complete Complete  PCP or Specialist Appt within 3-5 Days  Complete   HRI or Home Care Consult Complete Complete   Social Work Consult for Recovery Care Planning/Counseling Complete Complete   Palliative Care Screening Not Applicable Not Applicable   Medication Review Oceanographer) Complete Complete Complete  HRI or Home Care Consult   Complete  Skilled Nursing Facility   Not Applicable

## 2023-05-06 NOTE — Progress Notes (Signed)
Progress Note   Patient: Douglas Peterson. ZOX:[id number] DOB: 17-May-1951 DOA: 05/01/2023     5 DOS: the patient was seen and examined on 05/06/2023   Brief hospital course: 72 year old male with history significant for alcohol dependence who was previously under DSS guardianship (no longer) who presents to the ED with generalized weakness for the last few weeks associated nausea. Does admit to drinking.    Sodium levels improving with intravenous fluids.  I had a conversation with the patient on 6/29.  I explained that he is high risk for further withdrawals and if he did not wish to stop drinking that he did not need to go into withdrawal.  Patient expresses desire to stop drinking   7/2: Modified barium swallow with severe chronic oropharyngeal dysphagia.  Patient high risk for aspiration with any food consistency.  SLP has recommended n.p.o. status.   I had a lengthy discussion with the patient on 7/2.  Explained that he is and will always will be at high risk for withdrawals.  I do not wish to keep him n.p.o. at this time as I do not feel this would benefit him long-term.  I relayed to him my concerns and at end of our conversation patient has agreed to DNR status.      Assessment and Plan:  Principal Problem:   Hyponatremia Active Problems:   Hypokalemia   Paroxysmal A-fib (HCC)   Essential hypertension   BPH (benign prostatic hyperplasia)   Dyslipidemia   Alcohol dependence (HCC)   Alcohol dependence Acute alcohol withdrawal Improved Continue phenobarbital taper.  CIWA protocol with as needed Ativan.   Withdrawal symptoms have been improving.  [name] agreeable to skilled nursing facility placement.   Hyponatremia Improved with intravenous fluids.  Normal certainly in the setting of excessive alcohol intake and likely malnutrition.    Hypokalemia Secondary to poor nutritional status.  Replace potassium and monitor   Paroxysmal atrial fibrillation Continue home  atenolol.   Rate currently controlled.   Not a candidate for anticoagulation   Essential hypertension Continue atenolol     Severe oropharyngeal dysphagia Chronic aspiration Modified barium swallow completed 7/2.  Reviewed results with SLP.  Patient will be at high risk for aspiration with any food consistency.  Given these findings Dr Melodie Bouillon had a lengthy discussion with the patient on 7/2.  Explained that he will be at high risk for aspiration with any food consistency and should he choose to eat while admitted to the hospital or really under any other healthcare setting he should be a DNR as it is unlikely that a code event would result in any preservation of his life and would likely cause suffering.  He expressed understanding of my concerns and has agreed to DNR status. Discussed with patient who also declines a PEG tube at this time    Stage I pressure injury, right buttock Turn frequently to prevent worsening of pressure ulcer         Subjective: [name] seen and examined.  No new complaints  Physical Exam: Vitals:   05/05/23 1947 05/06/23 0414 05/06/23 0737 05/06/23 1545  BP: 120/66 (!) 168/76 (!) 161/80 (!) 156/81  Pulse: (!) 58 60 64 63  Resp: 18 20 18 20   Temp: (!) 97.4 F (36.3 C) 97.9 F (36.6 C) 98.8 F (37.1 C) 98 F (36.7 C)  TempSrc: Oral Oral    SpO2: 96% 98% 95% 95%  Weight:      Height:  General exam: No apparent distress Respiratory system: Scattered crackles bilaterally.  Normal work of breathing.  Room air Cardiovascular system: S1-S2, RRR, no murmurs, no pedal edema Gastrointestinal system: Thin, soft, NT/ND, normal bowel sounds Central nervous system: Alert and oriented. No focal neurological deficits. Extremities: Symmetrically decreased power bilaterally Skin: No rashes, lesions or ulcers Psychiatry: Judgement and insight appear impaired. Mood & affect flattened.    Data Reviewed:  There are no new results to review at this  time.  Family Communication: None  Disposition: Status is: Inpatient Remains inpatient appropriate because: Awaiting placement  Planned Discharge Destination: Skilled nursing facility    Time spent: 33 minutes  Author: Lucile Shutters, MD 05/06/2023 4:00 PM  For on call review [email or web address].

## 2023-05-07 DIAGNOSIS — E871 Hypo-osmolality and hyponatremia: Secondary | ICD-10-CM | POA: Diagnosis not present

## 2023-05-07 LAB — BASIC METABOLIC PANEL
Anion gap: 8 (ref 5–15)
BUN: 7 mg/dL — ABNORMAL LOW (ref 8–23)
CO2: 25 mmol/L (ref 22–32)
Calcium: 8.8 mg/dL — ABNORMAL LOW (ref 8.9–10.3)
Chloride: 96 mmol/L — ABNORMAL LOW (ref 98–111)
Creatinine, Ser: 0.59 mg/dL — ABNORMAL LOW (ref 0.61–1.24)
GFR, Estimated: >60 mL/min (ref >60)
Glucose, Bld: 86 mg/dL (ref 70–99)
Potassium: 3.9 mmol/L (ref 3.5–5.1)
Sodium: 129 mmol/L — ABNORMAL LOW (ref 135–145)

## 2023-05-07 MED ORDER — MAGNESIUM HYDROXIDE 400 MG/5ML PO SUSP: 30.0000 mL | Freq: Once | ORAL | Status: DC

## 2023-05-07 MED ORDER — SODIUM CHLORIDE 1 G PO TABS
1.0000 g | ORAL_TABLET | Freq: Three times a day (TID) | ORAL | Status: AC
  Administered 2023-05-07 – 2023-05-10 (×8): 1 g via ORAL
  Filled 2023-05-07 (×8): qty 1
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
## 2023-05-07 NOTE — Progress Notes (Signed)
Progress Note   Patient: Douglas Peterson. UJW:[id number] DOB: 1951-06-05 DOA: 05/01/2023     6 DOS: the patient was seen and examined on 05/07/2023   Brief hospital course:  72 year old male with history significant for alcohol dependence who was previously under DSS guardianship (no longer) who presents to the ED with generalized weakness for the last few weeks associated nausea. Does admit to drinking.    Sodium levels improving with intravenous fluids.  I had a conversation with the patient on 6/29.  I explained that he is high risk for further withdrawals and if he did not wish to stop drinking that he did not need to go into withdrawal.  Patient expresses desire to stop drinking   7/2: Modified barium swallow with severe chronic oropharyngeal dysphagia.  Patient high risk for aspiration with any food consistency.  SLP has recommended n.p.o. status.   I had a lengthy discussion with the patient on 7/2.  Explained that he is and will always will be at high risk for withdrawals.  I do not wish to keep him n.p.o. at this time as I do not feel this would benefit him long-term.  I relayed to him my concerns and at end of our conversation patient has agreed to DNR status.   Assessment and Plan:  Principal Problem:   Hyponatremia Active Problems:   Hypokalemia   Paroxysmal A-fib (HCC)   Essential hypertension   BPH (benign prostatic hyperplasia)   Dyslipidemia   Alcohol dependence (HCC)   Alcohol dependence Acute alcohol withdrawal Improved Continue phenobarbital taper.  CIWA protocol with as needed Ativan.   Withdrawal symptoms have been improving.  [name] agreeable to skilled nursing facility placement.   Hyponatremia Improved with intravenous fluids.   Normal certainly in the setting of excessive alcohol intake and likely malnutrition. Start patient on salt tablets     Hypokalemia Secondary to poor nutritional status.  Replace potassium and monitor   Paroxysmal atrial  fibrillation Continue home atenolol.   Rate currently controlled.   Not a candidate for anticoagulation   Essential hypertension Continue atenolol       Severe oropharyngeal dysphagia Chronic aspiration Modified barium swallow completed 7/2.  Reviewed results with SLP.  Patient will be at high risk for aspiration with any food consistency.  Given these findings Dr Melodie Bouillon had a lengthy discussion with the patient on 7/2.  Explained that he will be at high risk for aspiration with any food consistency and should he choose to eat while admitted to the hospital or really under any other healthcare setting he should be a DNR as it is unlikely that a code event would result in any preservation of his life and would likely cause suffering.  He expressed understanding of my concerns and has agreed to DNR status. Discussed with patient who also declines a PEG tube at this time       Stage I pressure injury, right buttock Turn frequently to prevent worsening of pressure ulcer         Subjective: Complains of feeling unwell.  Has nausea and has not had a bowel movement.  Physical Exam: Vitals:   05/06/23 1545 05/06/23 1953 05/07/23 0320 05/07/23 0740  BP: (!) 156/81 110/73 (!) 143/79 127/81  Pulse: 63 71 (!) 55 64  Resp: 20 18 18 16   Temp: 98 F (36.7 C) 98 F (36.7 C) 98 F (36.7 C) 98.6 F (37 C)  TempSrc:  Oral  Oral  SpO2: 95% 98% 97% 96%  Weight:      Height:       General exam: No apparent distress Respiratory system: Scattered crackles bilaterally.  Normal work of breathing.  Room air Cardiovascular system: S1-S2, RRR, no murmurs, no pedal edema Gastrointestinal system: Thin, soft, NT/ND, normal bowel sounds Central nervous system: Alert and oriented.  Contracted right upper extremity Extremities: Symmetrically decreased power bilaterally Skin: No rashes, lesions or ulcers Psychiatry: Judgement and insight appear impaired. Mood & affect flattened.       Data  Reviewed: Labs reviewed.  Sodium 129 There are no new results to review at this time.  Family Communication: Plan of care discussed with patient  Disposition: Status is: Inpatient Remains inpatient appropriate because: Awaiting discharge to skilled nursing facility  Planned Discharge Destination: Skilled nursing facility    Time spent: 33 minutes  Author: Lucile Shutters, MD 05/07/2023 2:38 PM  For on call review [email or web address].

## 2023-05-07 NOTE — TOC Progression Note (Signed)
Transition of Care (TOC) - Progression Note    Patient Details  Name: Douglas Peterson. MRN: [id number] Date of Birth: 01-Apr-1951  Transition of Care [facility]) CM/SW Contact  Margarito Liner, LCSW Phone Number: 05/07/2023, 12:05 PM  Clinical Narrative:  Per MD, patient will not discharge today. Left message for [facility] SNF liaison to notify.   Expected Discharge Plan: Home w Home Health Services Barriers to Discharge: Continued Medical Work up  Expected Discharge Plan and Services                                               Social Determinants of Health (SDOH) Interventions SDOH Screenings   Food Insecurity: No Food Insecurity (05/01/2023)  Housing: Low Risk  (05/01/2023)  Transportation Needs: No Transportation Needs (05/01/2023)  Utilities: Not At Risk (05/01/2023)  Financial Resource Strain: Unknown (06/13/2019)  Physical Activity: Unknown (06/13/2019)  Social Connections: Unknown (06/13/2019)  Stress: Unknown (06/13/2019)  Tobacco Use: Medium Risk (05/01/2023)    Readmission Risk Interventions    04/29/2022   10:26 AM 03/14/2022   12:48 PM 08/23/2020   11:52 AM  Readmission Risk Prevention Plan  Transportation Screening Complete Complete Complete  PCP or Specialist Appt within 3-5 Days  Complete   HRI or Home Care Consult Complete Complete   Social Work Consult for Recovery Care Planning/Counseling Complete Complete   Palliative Care Screening Not Applicable Not Applicable   Medication Review [employer]) Complete Complete Complete  HRI or Home Care Consult   Complete  Skilled Nursing Facility   Not Applicable

## 2023-05-08 DIAGNOSIS — E871 Hypo-osmolality and hyponatremia: Secondary | ICD-10-CM | POA: Diagnosis not present

## 2023-05-08 MED ORDER — ADULT MULTIVITAMIN W/MINERALS CH: 1.0000 | ORAL_TABLET | Freq: Every day | ORAL | 0 refills | Status: DC

## 2023-05-08 MED ORDER — SODIUM CHLORIDE 1 G PO TABS: 1.0000 g | ORAL_TABLET | Freq: Three times a day (TID) | ORAL | 0 refills | Status: DC

## 2023-05-08 NOTE — Care Management Important Message (Signed)
Important Message  Patient Details  Name: Douglas Peterson. MRN: [id number] Date of Birth: Jul 24, 1951   Medicare Important Message Given:  Yes  Delivered by [facility], LCSW   [name] 05/08/2023, 11:34 AM
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
## 2023-05-08 NOTE — TOC Progression Note (Signed)
Transition of Care (TOC) - Progression Note    Patient Details  Name: Douglas Peterson. MRN: [id number] Date of Birth: 12/19/1950  Transition of Care [facility]) CM/SW Contact  [name] Phone Number: 05/08/2023, 2:03 PM  [employer] Detailed Notice of Discharge letter created and saved: Yes (Completed by Alfonso Ramus, LCSW) Detailed Notice of Discharge Document Given to [employer]: Yes (Completed by Meagan Hagwood, LCSW) [employer] Document Created: Yes [employer] appeal documents uploaded to [employer] stite: Yes (Upload Confirmation:[id number])    Case ID: [id number]
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
## 2023-05-08 NOTE — TOC Progression Note (Addendum)
Transition of Care (TOC) - Progression Note    Patient Details  Name: Douglas Peterson. MRN: [id number] Date of Birth: 1951-06-12  Transition of Care [facility]) CM/SW Contact  Liliana Cline, LCSW Phone Number: 05/08/2023, 11:43 AM  Clinical Narrative:    [name] medically stable for DC per MD.  Met with patient at bedside. Patient requested clothes. Provided clothing closet clothes for patient (shorts, t shirt, underwear). Patient says he has shoes already.  Patient states he is going to appeal his discharge. Care Team updated.   12:10- Spoke to Tammy in Admissions at [facility]. She states they can take patient over the weekend pending appeal outcome.   1:55- Met with patient to complete HINN 12 and DND.  Expected Discharge Plan: Home w Home Health Services Barriers to Discharge: Continued Medical Work up  Expected Discharge Plan and Services                                               Social Determinants of Health (SDOH) Interventions SDOH Screenings   Food Insecurity: No Food Insecurity (05/01/2023)  Housing: Low Risk  (05/01/2023)  Transportation Needs: No Transportation Needs (05/01/2023)  Utilities: Not At Risk (05/01/2023)  Financial Resource Strain: Unknown (06/13/2019)  Physical Activity: Unknown (06/13/2019)  Social Connections: Unknown (06/13/2019)  Stress: Unknown (06/13/2019)  Tobacco Use: Medium Risk (05/01/2023)    Readmission Risk Interventions    04/29/2022   10:26 AM 03/14/2022   12:48 PM 08/23/2020   11:52 AM  Readmission Risk Prevention Plan  Transportation Screening Complete Complete Complete  PCP or Specialist Appt within 3-5 Days  Complete   HRI or Home Care Consult Complete Complete   Social Work Consult for Recovery Care Planning/Counseling Complete Complete   Palliative Care Screening Not Applicable Not Applicable   Medication Review Oceanographer) Complete Complete Complete  HRI or Home Care Consult   Complete  Skilled  Nursing Facility   Not Applicable

## 2023-05-08 NOTE — Progress Notes (Signed)
SLP Cancellation Note  Patient Details Name: Douglas Peterson. MRN: [id number] DOB: Nov 26, 1950   Cancelled treatment:       Reason Eval/Treat Not Completed:  (chart reviewed; consulted MD post MBSS and recommendations.) MD consulted post the MBSS(05/05/23) and again yesterday(05/06/23) re: pt's MBSS results and risk for aspiration/aspiration pneumonia based on the results as well as the recommendation for NPO status. MD discussed these issues and pt's wishes moving forward. Per MD notes, pt is choosing to continue an oral diet at this time and agreed w/ DNR code status. TOC is involved for placement options.  No further skilled ST services indicated currently. MD to reconsult if new needs arise during admit. MD agreed.      Jerilynn Som, MS, CCC-SLP Speech Language Pathologist Rehab Services; [facility] [phone] (ascom) , 05/08/2023, 8:23 AM

## 2023-05-09 DIAGNOSIS — E871 Hypo-osmolality and hyponatremia: Secondary | ICD-10-CM | POA: Diagnosis not present

## 2023-05-09 LAB — BASIC METABOLIC PANEL
Anion gap: 10 (ref 5–15)
BUN: 9 mg/dL (ref 8–23)
CO2: 22 mmol/L (ref 22–32)
Calcium: 8.8 mg/dL — ABNORMAL LOW (ref 8.9–10.3)
Chloride: 98 mmol/L (ref 98–111)
Creatinine, Ser: 0.56 mg/dL — ABNORMAL LOW (ref 0.61–1.24)
GFR, Estimated: >60 mL/min (ref >60)
Glucose, Bld: 108 mg/dL — ABNORMAL HIGH (ref 70–99)
Potassium: 4 mmol/L (ref 3.5–5.1)
Sodium: 130 mmol/L — ABNORMAL LOW (ref 135–145)

## 2023-05-09 LAB — MAGNESIUM: Magnesium: 2.5 mg/dL — ABNORMAL HIGH (ref 1.7–2.4)

## 2023-05-09 NOTE — TOC Progression Note (Signed)
Transition of Care (TOC) - Progression Note    Patient Details  Name: Douglas Peterson. MRN: [id number] Date of Birth: 08-04-1951  Transition of Care [facility]) CM/SW Contact  Liliana Cline, LCSW Phone Number: 05/09/2023, 3:16 PM  Clinical Narrative:    IM re-faxed to [facility] per their request.    Expected Discharge Plan: Home w Home Health Services Barriers to Discharge: Continued Medical Work up  Expected Discharge Plan and Services         Expected Discharge Date: 05/08/23                                     Social Determinants of Health (SDOH) Interventions SDOH Screenings   Food Insecurity: No Food Insecurity (05/01/2023)  Housing: Low Risk  (05/01/2023)  Transportation Needs: No Transportation Needs (05/01/2023)  Utilities: Not At Risk (05/01/2023)  Financial Resource Strain: Unknown (06/13/2019)  Physical Activity: Unknown (06/13/2019)  Social Connections: Unknown (06/13/2019)  Stress: Unknown (06/13/2019)  Tobacco Use: Medium Risk (05/01/2023)    Readmission Risk Interventions    04/29/2022   10:26 AM 03/14/2022   12:48 PM 08/23/2020   11:52 AM  Readmission Risk Prevention Plan  Transportation Screening Complete Complete Complete  PCP or Specialist Appt within 3-5 Days  Complete   HRI or Home Care Consult Complete Complete   Social Work Consult for Recovery Care Planning/Counseling Complete Complete   Palliative Care Screening Not Applicable Not Applicable   Medication Review Oceanographer) Complete Complete Complete  HRI or Home Care Consult   Complete  Skilled Nursing Facility   Not Applicable

## 2023-05-09 NOTE — TOC Progression Note (Addendum)
Transition of Care (TOC) - Progression Note    Patient Details  Name: Douglas Peterson. MRN: [id number] Date of Birth: 1951-01-01  Transition of Care [facility]) CM/SW Contact  Colette Ribas, [place] Phone Number: 05/09/2023, 3:14 PM  Clinical Narrative:     CSW met with patient bedside to relay results of appeal (denial). The patient chose to call &submit a reconsideration. He also expressed he would go to [facility] at discharge if he gets a bed offer. The patient also advised he would be open to local SNF facilities but he feels like [facility] far. CSW advised patient we'll wait to hear back regarding appeal and check on other bed offers. [facility] with [name], she advised they won't have any details on placement status until Monday.  Expected Discharge Plan: Home w Home Health Services Barriers to Discharge: Continued Medical Work up  Expected Discharge Plan and Services         Expected Discharge Date: 05/08/23                                     Social Determinants of Health (SDOH) Interventions SDOH Screenings   Food Insecurity: No Food Insecurity (05/01/2023)  Housing: Low Risk  (05/01/2023)  Transportation Needs: No Transportation Needs (05/01/2023)  Utilities: Not At Risk (05/01/2023)  Financial Resource Strain: Unknown (06/13/2019)  Physical Activity: Unknown (06/13/2019)  Social Connections: Unknown (06/13/2019)  Stress: Unknown (06/13/2019)  Tobacco Use: Medium Risk (05/01/2023)    Readmission Risk Interventions    04/29/2022   10:26 AM 03/14/2022   12:48 PM 08/23/2020   11:52 AM  Readmission Risk Prevention Plan  Transportation Screening Complete Complete Complete  PCP or Specialist Appt within 3-5 Days  Complete   HRI or Home Care Consult Complete Complete   Social Work Consult for Recovery Care Planning/Counseling Complete Complete   Palliative Care Screening Not Applicable Not Applicable   Medication Review Oceanographer) Complete Complete  Complete  HRI or Home Care Consult   Complete  Skilled Nursing Facility   Not Applicable

## 2023-05-09 NOTE — Plan of Care (Signed)
  Problem: Activity: Goal: Risk for activity intolerance will decrease Outcome: Progressing   Problem: Nutrition: Goal: Adequate nutrition will be maintained Outcome: Progressing   Problem: Elimination: Goal: Will not experience complications related to bowel motility Outcome: Progressing   Problem: Safety: Goal: Ability to remain free from injury will improve Outcome: Progressing   Problem: Skin Integrity: Goal: Risk for impaired skin integrity will decrease Outcome: Progressing   

## 2023-05-09 NOTE — TOC Progression Note (Signed)
Transition of Care (TOC) - Progression Note    Patient Details  Name: Douglas Peterson. MRN: [id number] Date of Birth: 03/31/1951  Transition of Care [facility]) CM/SW Contact  Colette Ribas, [place] Phone Number: 05/09/2023, 10:18 AM  Clinical Narrative:     CSW Check appeal status, currently still under clinical review.  Expected Discharge Plan: Home w Home Health Services Barriers to Discharge: Continued Medical Work up  Expected Discharge Plan and Services         Expected Discharge Date: 05/08/23                                     Social Determinants of Health (SDOH) Interventions SDOH Screenings   Food Insecurity: No Food Insecurity (05/01/2023)  Housing: Low Risk  (05/01/2023)  Transportation Needs: No Transportation Needs (05/01/2023)  Utilities: Not At Risk (05/01/2023)  Financial Resource Strain: Unknown (06/13/2019)  Physical Activity: Unknown (06/13/2019)  Social Connections: Unknown (06/13/2019)  Stress: Unknown (06/13/2019)  Tobacco Use: Medium Risk (05/01/2023)    Readmission Risk Interventions    04/29/2022   10:26 AM 03/14/2022   12:48 PM 08/23/2020   11:52 AM  Readmission Risk Prevention Plan  Transportation Screening Complete Complete Complete  PCP or Specialist Appt within 3-5 Days  Complete   HRI or Home Care Consult Complete Complete   Social Work Consult for Recovery Care Planning/Counseling Complete Complete   Palliative Care Screening Not Applicable Not Applicable   Medication Review Oceanographer) Complete Complete Complete  HRI or Home Care Consult   Complete  Skilled Nursing Facility   Not Applicable
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
## 2023-05-09 NOTE — Progress Notes (Signed)
Progress Note   Patient: Douglas Peterson. ZOX:[id number] DOB: 1951-04-08 DOA: 05/01/2023     8 DOS: the patient was seen and examined on 05/09/2023   Brief hospital course:  72 year old male with history significant for alcohol dependence who was previously under DSS guardianship (no longer) who presents to the ED with generalized weakness for the last few weeks associated nausea. Does admit to drinking.    Sodium levels improving with intravenous fluids.  I had a conversation with the patient on 6/29.  I explained that he is high risk for further withdrawals and if he did not wish to stop drinking that he did not need to go into withdrawal.  Patient expresses desire to stop drinking   7/2: Modified barium swallow with severe chronic oropharyngeal dysphagia.  Patient high risk for aspiration with any food consistency.  SLP has recommended n.p.o. status.   Dr Georgeann Oppenheim had a lengthy discussion with the patient on 7/2.  Explained that he is and will always will be at high risk for withdrawals.  I do not wish to keep him n.p.o. at this time as I do not feel this would benefit him long-term.  I relayed to him my concerns and at end of our conversation patient has agreed to DNR status.     Assessment and Plan:   Principal Problem:   Hyponatremia Active Problems:   Hypokalemia   Paroxysmal A-fib (HCC)   Essential hypertension   BPH (benign prostatic hyperplasia)   Dyslipidemia   Alcohol dependence (HCC)   Alcohol dependence Acute alcohol withdrawal Improved Completed phenobarbital taper.  CIWA protocol with as needed Ativan.   Withdrawal symptoms have  improved.  [name] agreeable to skilled nursing facility placement.   Hyponatremia Improved   Normal certainly in the setting of excessive alcohol intake and likely malnutrition. Start patient on salt tablets     Hypokalemia Secondary to poor nutritional status.  Replace potassium and monitor   Paroxysmal atrial  fibrillation Continue home atenolol.   Rate currently controlled.   Not a candidate for anticoagulation   Essential hypertension Continue atenolol       Severe oropharyngeal dysphagia Chronic aspiration Modified barium swallow completed 7/2.  Reviewed results with SLP.  Patient will be at high risk for aspiration with any food consistency.  Given these findings Dr Melodie Bouillon had a lengthy discussion with the patient on 7/2.  Explained that he will be at high risk for aspiration with any food consistency and should he choose to eat while admitted to the hospital or really under any other healthcare setting he should be a DNR as it is unlikely that a code event would result in any preservation of his life and would likely cause suffering.  He expressed understanding of my concerns and has agreed to DNR status. Discussed with patient who also declines a PEG tube at this time       Stage I pressure injury, right buttock Turn frequently to prevent worsening of pressure ulcer   Patient was discharged on 07/05 but appeared his discharge and remains in the hospital             Subjective: [name] seen and examined at the bedside.  Up and about in the room.  Physical Exam: Vitals:   05/08/23 1551 05/08/23 1940 05/09/23 0332 05/09/23 0816  BP: 104/73 122/69 130/70 (!) 142/86  Pulse: 60 (!) 59 (!) 58 63  Resp: 16 20 16 16   Temp: 98.2 F (36.8 C) 97.8 F (  36.6 C) 97.9 F (36.6 C) 98.2 F (36.8 C)  TempSrc:   Oral Oral  SpO2: 98% 97% 96% 92%  Weight:      Height:       General exam: No apparent distress Respiratory system: Scattered crackles bilaterally.  Normal work of breathing.  Room air Cardiovascular system: S1-S2, RRR, no murmurs, no pedal edema Gastrointestinal system: Thin, soft, NT/ND, normal bowel sounds Central nervous system: Alert and oriented.  Contracted right upper extremity Extremities: Symmetrically decreased power bilaterally Skin: No rashes, lesions or  ulcers Psychiatry: Judgement and insight appear impaired. Mood & affect flattened.     Data Reviewed: Labs reviewed.  Sodium 130 There are no new results to review at this time.  Family Communication: Plan of care discussed with patient  Disposition: Status is: Inpatient Remains inpatient appropriate because: Patient has been discharged.  Appeal pending  Planned Discharge Destination: Skilled nursing facility    Time spent: 30 minutes  Author: Lucile Shutters, MD 05/09/2023 12:12 PM  For on call review [email or web address].

## 2023-05-10 DIAGNOSIS — E871 Hypo-osmolality and hyponatremia: Secondary | ICD-10-CM | POA: Diagnosis not present

## 2023-05-10 MED ORDER — METOCLOPRAMIDE HCL 5 MG/ML IJ SOLN
10.0000 mg | Freq: Once | INTRAMUSCULAR | Status: AC
  Administered 2023-05-10: 10 mg via INTRAVENOUS
  Filled 2023-05-10: qty 2

## 2023-05-10 MED ORDER — MELATONIN 5 MG PO TABS
5.0000 mg | ORAL_TABLET | Freq: Every day | ORAL | Status: DC
  Administered 2023-05-10 – 2023-05-11 (×2): 5 mg via ORAL
  Filled 2023-05-10 (×2): qty 1

## 2023-05-10 NOTE — TOC Progression Note (Signed)
Transition of Care (TOC) - Progression Note    Patient Details  Name: Douglas Peterson. MRN: [id number] Date of Birth: [birth year]  Transition of Care [facility]) CM/SW Contact  [name], RN Phone Number: 05/10/2023, 9:26 AM  Clinical Narrative:     Reconsideration of appeal pending    Expected Discharge Plan: Home w Home Health Services Barriers to Discharge: Continued Medical Work up  Expected Discharge Plan and Services         Expected Discharge Date: 05/08/23                                     Social Determinants of Health (SDOH) Interventions SDOH Screenings   Food Insecurity: No Food Insecurity (05/01/2023)  Housing: Low Risk  (05/01/2023)  Transportation Needs: No Transportation Needs (05/01/2023)  Utilities: Not At Risk (05/01/2023)  Financial Resource Strain: Unknown (06/13/2019)  Physical Activity: Unknown (06/13/2019)  Social Connections: Unknown (06/13/2019)  Stress: Unknown (06/13/2019)  Tobacco Use: Medium Risk (05/01/2023)    Readmission Risk Interventions    04/29/2022   10:26 AM 03/14/2022   12:48 PM 08/23/2020   11:52 AM  Readmission Risk Prevention Plan  Transportation Screening Complete Complete Complete  PCP or Specialist Appt within 3-5 Days  Complete   HRI or Home Care Consult Complete Complete   Social Work Consult for Recovery Care Planning/Counseling Complete Complete   Palliative Care Screening Not Applicable Not Applicable   Medication Review Oceanographer) Complete Complete Complete  HRI or Home Care Consult   Complete  Skilled Nursing Facility   Not Applicable

## 2023-05-10 NOTE — Plan of Care (Signed)

## 2023-05-10 NOTE — Plan of Care (Signed)
  Problem: Activity: Goal: Risk for activity intolerance will decrease Outcome: Progressing   Problem: Nutrition: Goal: Adequate nutrition will be maintained Outcome: Progressing   Problem: Safety: Goal: Ability to remain free from injury will improve Outcome: Progressing   Problem: Skin Integrity: Goal: Risk for impaired skin integrity will decrease Outcome: Progressing   
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
## 2023-05-11 DIAGNOSIS — E871 Hypo-osmolality and hyponatremia: Secondary | ICD-10-CM | POA: Diagnosis not present

## 2023-05-11 NOTE — TOC Progression Note (Signed)
Transition of Care (TOC) - Progression Note    Patient Details  Name: Douglas Peterson. MRN: [id number] Date of Birth: [birth year]  Transition of Care [facility]) CM/SW Contact  [name], RN Phone Number: 05/11/2023, 10:07 AM  Clinical Narrative:    Appeal pending.  Bedside RN aware that dc order to stay in place until determination is made    Expected Discharge Plan: Home w Home Health Services Barriers to Discharge: Continued Medical Work up  Expected Discharge Plan and Services         Expected Discharge Date: 05/08/23                                     Social Determinants of Health (SDOH) Interventions SDOH Screenings   Food Insecurity: No Food Insecurity (05/01/2023)  Housing: Low Risk  (05/01/2023)  Transportation Needs: No Transportation Needs (05/01/2023)  Utilities: Not At Risk (05/01/2023)  Financial Resource Strain: Unknown (06/13/2019)  Physical Activity: Unknown (06/13/2019)  Social Connections: Unknown (06/13/2019)  Stress: Unknown (06/13/2019)  Tobacco Use: Medium Risk (05/01/2023)    Readmission Risk Interventions    04/29/2022   10:26 AM 03/14/2022   12:48 PM 08/23/2020   11:52 AM  Readmission Risk Prevention Plan  Transportation Screening Complete Complete Complete  PCP or Specialist Appt within 3-5 Days  Complete   HRI or Home Care Consult Complete Complete   Social Work Consult for Recovery Care Planning/Counseling Complete Complete   Palliative Care Screening Not Applicable Not Applicable   Medication Review Oceanographer) Complete Complete Complete  HRI or Home Care Consult   Complete  Skilled Nursing Facility   Not Applicable

## 2023-05-11 NOTE — Progress Notes (Signed)
Progress Note   Patient: Douglas Peterson. ZOX:[id number] DOB: 10/07/1951 DOA: 05/01/2023     10 DOS: the patient was seen and examined on 05/11/2023   Brief hospital course:  72 year old male with history significant for alcohol dependence who was previously under DSS guardianship (no longer) who presents to the ED with generalized weakness for the last few weeks associated nausea. Does admit to drinking.    Sodium levels improving with intravenous fluids.  I had a conversation with the patient on 6/29.  I explained that he is high risk for further withdrawals and if he did not wish to stop drinking that he did not need to go into withdrawal.  Patient expresses desire to stop drinking   7/2: Modified barium swallow with severe chronic oropharyngeal dysphagia.  Patient high risk for aspiration with any food consistency.  SLP has recommended n.p.o. status.   Dr Georgeann Oppenheim had a lengthy discussion with the patient on 7/2.  Explained that he is and will always will be at high risk for withdrawals.  I do not wish to keep him n.p.o. at this time as I do not feel this would benefit him long-term.  I relayed to him my concerns and at end of our conversation patient has agreed to DNR status.  Assessment and Plan:   Principal Problem:   Hyponatremia Active Problems:   Hypokalemia   Paroxysmal A-fib (HCC)   Essential hypertension   BPH (benign prostatic hyperplasia)   Dyslipidemia   Alcohol dependence (HCC)   Alcohol dependence Acute alcohol withdrawal Improved Completed phenobarbital taper.  CIWA protocol with as needed Ativan.   Withdrawal symptoms have  improved.  [name] agreeable to skilled nursing facility placement.   Hyponatremia Improved   Normal certainly in the setting of excessive alcohol intake and likely malnutrition. Start patient on salt tablets     Hypokalemia Secondary to poor nutritional status.  Replace potassium and monitor   Paroxysmal atrial  fibrillation Continue home atenolol.   Rate currently controlled.   Not a candidate for anticoagulation   Essential hypertension Continue atenolol       Severe oropharyngeal dysphagia Chronic aspiration Modified barium swallow completed 7/2.  Reviewed results with SLP.  Patient will be at high risk for aspiration with any food consistency.  Given these findings Dr Melodie Bouillon had a lengthy discussion with the patient on 7/2.  Explained that he will be at high risk for aspiration with any food consistency and should he choose to eat while admitted to the hospital or really under any other healthcare setting he should be a DNR as it is unlikely that a code event would result in any preservation of his life and would likely cause suffering.  He expressed understanding of my concerns and has agreed to DNR status. Discussed with patient who also declines a PEG tube at this time       Stage I pressure injury, right buttock Turn frequently to prevent worsening of pressure ulcer   Patient was discharged on 07/05 but appealed his discharge and remains in the hospital       Subjective: Seen and examined at the bedside.  Continues to complain of nausea but able to tolerate oral intake.  Requests prune juice for constipation  Physical Exam: Vitals:   05/10/23 1951 05/11/23 0447 05/11/23 0448 05/11/23 0744  BP: 116/68  112/69 134/75  Pulse: (!) 57 (!) 55 (!) 56 (!) 56  Resp: 17  18 18   Temp: 97.8 F (36.6 C)  97.6 F (36.4 C) (!) 97.5 F (36.4 C)  TempSrc: Oral  Oral Oral  SpO2: 96% 97% 95% 95%  Weight:      Height:       General exam: No apparent distress Respiratory system: Scattered crackles bilaterally.  Normal work of breathing.  Room air Cardiovascular system: S1-S2, RRR, no murmurs, no pedal edema Gastrointestinal system: Thin, soft, NT/ND, normal bowel sounds Central nervous system: Alert and oriented.  Contracted right upper extremity Extremities: Symmetrically decreased power  bilaterally Skin: No rashes, lesions or ulcers Psychiatry: Judgement and insight appear impaired. Mood & affect flattened.      Data Reviewed:  There are no new results to review at this time.  Family Communication: Plan of care discussed with patient  Disposition: Status is: Inpatient Remains inpatient appropriate because: Awaiting discharge  Planned Discharge Destination: Skilled nursing facility    Time spent: 32 minutes  Author: Lucile Shutters, MD 05/11/2023 2:33 PM  For on call review [email or web address].

## 2023-05-11 NOTE — Progress Notes (Signed)
Progress Note   Patient: Douglas Peterson. QMV:[id number] DOB: 05-Sep-1951 DOA: 05/01/2023     10 DOS: the patient was seen and examined on 05/11/2023   Brief hospital course:  72 year old male with history significant for alcohol dependence who was previously under DSS guardianship (no longer) who presents to the ED with generalized weakness for the last few weeks associated nausea. Does admit to drinking.    Sodium levels improving with intravenous fluids.  I had a conversation with the patient on 6/29.  I explained that he is high risk for further withdrawals and if he did not wish to stop drinking that he did not need to go into withdrawal.  Patient expresses desire to stop drinking   7/2: Modified barium swallow with severe chronic oropharyngeal dysphagia.  Patient high risk for aspiration with any food consistency.  SLP has recommended n.p.o. status.   Dr Georgeann Oppenheim had a lengthy discussion with the patient on 7/2.  Explained that he is and will always will be at high risk for withdrawals.  I do not wish to keep him n.p.o. at this time as I do not feel this would benefit him long-term.  I relayed to him my concerns and at end of our conversation patient has agreed to DNR status.     Assessment and Plan:  Principal Problem:   Hyponatremia Active Problems:   Hypokalemia   Paroxysmal A-fib (HCC)   Essential hypertension   BPH (benign prostatic hyperplasia)   Dyslipidemia   Alcohol dependence (HCC)   Alcohol dependence Acute alcohol withdrawal Improved Completed phenobarbital taper.  CIWA protocol with as needed Ativan.   Withdrawal symptoms have  improved.  [name] agreeable to skilled nursing facility placement.   Hyponatremia Improved   Normal certainly in the setting of excessive alcohol intake and likely malnutrition. Start patient on salt tablets     Hypokalemia Secondary to poor nutritional status.  Replace potassium and monitor   Paroxysmal atrial  fibrillation Continue home atenolol.   Rate currently controlled.   Not a candidate for anticoagulation   Essential hypertension Continue atenolol       Severe oropharyngeal dysphagia Chronic aspiration Modified barium swallow completed 7/2.  Reviewed results with SLP.  Patient will be at high risk for aspiration with any food consistency.  Given these findings Dr Melodie Bouillon had a lengthy discussion with the patient on 7/2.  Explained that he will be at high risk for aspiration with any food consistency and should he choose to eat while admitted to the hospital or really under any other healthcare setting he should be a DNR as it is unlikely that a code event would result in any preservation of his life and would likely cause suffering.  He expressed understanding of my concerns and has agreed to DNR status. Discussed with patient who also declines a PEG tube at this time       Stage I pressure injury, right buttock Turn frequently to prevent worsening of pressure ulcer   Patient was discharged on 07/05 but appealed his discharge and remains in the hospital        Subjective: No new complaints  Physical Exam: Vitals:   05/10/23 1951 05/11/23 0447 05/11/23 0448 05/11/23 0744  BP: 116/68  112/69 134/75  Pulse: (!) 57 (!) 55 (!) 56 (!) 56  Resp: 17  18 18   Temp: 97.8 F (36.6 C)  97.6 F (36.4 C) (!) 97.5 F (36.4 C)  TempSrc: Oral  Oral Oral  SpO2: 96%  97% 95% 95%  Weight:      Height:       General exam: No apparent distress Respiratory system: Scattered crackles bilaterally.  Normal work of breathing.  Room air Cardiovascular system: S1-S2, RRR, no murmurs, no pedal edema Gastrointestinal system: Thin, soft, NT/ND, normal bowel sounds Central nervous system: Alert and oriented.  Contracted right upper extremity Extremities: Symmetrically decreased power bilaterally Skin: No rashes, lesions or ulcers Psychiatry: Judgement and insight appear impaired. Mood & affect  flattened.    Data Reviewed:  There are no new results to review at this time.  Family Communication: Plan of care discussed with patient  Disposition: Status is: Inpatient Remains inpatient appropriate because: Awaiting discharge  Planned Discharge Destination: Skilled nursing facility    Time spent: 32 minutes  Author: Lucile Shutters, MD 05/11/2023 3:12 PM  For on call review [email or web address].

## 2023-05-12 DIAGNOSIS — E871 Hypo-osmolality and hyponatremia: Secondary | ICD-10-CM | POA: Diagnosis not present

## 2023-05-12 MED ORDER — FOLIC ACID 1 MG PO TABS: 1.0000 mg | ORAL_TABLET | Freq: Every day | ORAL | 0 refills | Status: DC
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
## 2023-05-12 NOTE — TOC Transition Note (Signed)
Transition of Care [facility]) - CM/SW Discharge Note   Patient Details  Name: Douglas Peterson. MRN: [id number] Date of Birth: May 01, 1951  Transition of Care [facility]) CM/SW Contact:  [name], RN Phone Number: 05/12/2023, 2:45 PM   Clinical Narrative:      Reconsideration of appeal was denied.  [name] aware and to discharge today Patient in agreement to discharge to [facility] today  Patient will DC to: [facility] Anticipated DC date: 05/12/23  Family notified:patient declined for family to be notified Transport by: [facility] transport. Rider waiver signed any in chart   Per MD patient ready for DC to . RN, patient,  and facility notified of DC. Discharge Summary sent to facility. RN given number for report. DC packet on chart.  TOC signing off.    Barriers to Discharge: Continued Medical Work up   Patient Goals and CMS Choice      Discharge Placement                         Discharge Plan and Services Additional resources added to the After Visit Summary for                                       Social Determinants of Health (SDOH) Interventions SDOH Screenings   Food Insecurity: No Food Insecurity (05/01/2023)  Housing: Low Risk  (05/01/2023)  Transportation Needs: No Transportation Needs (05/01/2023)  Utilities: Not At Risk (05/01/2023)  Financial Resource Strain: Unknown (06/13/2019)  Physical Activity: Unknown (06/13/2019)  Social Connections: Unknown (06/13/2019)  Stress: Unknown (06/13/2019)  Tobacco Use: Medium Risk (05/01/2023)     Readmission Risk Interventions    04/29/2022   10:26 AM 03/14/2022   12:48 PM 08/23/2020   11:52 AM  Readmission Risk Prevention Plan  Transportation Screening Complete Complete Complete  PCP or Specialist Appt within 3-5 Days  Complete   HRI or Home Care Consult Complete Complete   Social Work Consult for Recovery Care Planning/Counseling Complete Complete   Palliative Care Screening Not Applicable  Not Applicable   Medication Review Oceanographer) Complete Complete Complete  HRI or Home Care Consult   Complete  Skilled Nursing Facility   Not Applicable
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
## 2023-05-12 NOTE — TOC Progression Note (Signed)
Transition of Care (TOC) - Progression Note    Patient Details  Name: Douglas Peterson. MRN: [id number] Date of Birth: May 09, 1951  Transition of Care [facility]) CM/SW Contact  [name], RN Phone Number: 05/12/2023, 10:13 AM  Clinical Narrative:     Appeal decision still pending  Expected Discharge Plan: Home w Home Health Services Barriers to Discharge: Continued Medical Work up  Expected Discharge Plan and Services         Expected Discharge Date: 05/08/23                                     Social Determinants of Health (SDOH) Interventions SDOH Screenings   Food Insecurity: No Food Insecurity (05/01/2023)  Housing: Low Risk  (05/01/2023)  Transportation Needs: No Transportation Needs (05/01/2023)  Utilities: Not At Risk (05/01/2023)  Financial Resource Strain: Unknown (06/13/2019)  Physical Activity: Unknown (06/13/2019)  Social Connections: Unknown (06/13/2019)  Stress: Unknown (06/13/2019)  Tobacco Use: Medium Risk (05/01/2023)    Readmission Risk Interventions    04/29/2022   10:26 AM 03/14/2022   12:48 PM 08/23/2020   11:52 AM  Readmission Risk Prevention Plan  Transportation Screening Complete Complete Complete  PCP or Specialist Appt within 3-5 Days  Complete   HRI or Home Care Consult Complete Complete   Social Work Consult for Recovery Care Planning/Counseling Complete Complete   Palliative Care Screening Not Applicable Not Applicable   Medication Review Oceanographer) Complete Complete Complete  HRI or Home Care Consult   Complete  Skilled Nursing Facility   Not Applicable

## 2023-05-12 NOTE — Plan of Care (Signed)
PMT shadowing. Patient ready for D/C to SNF, and TOC working on discharge planning. As goals are set, PMT will sign off at this time. Please reconsult if needs arise.

## 2023-05-12 NOTE — Progress Notes (Signed)
I have reviewed and concur with this student's documentation.   Leodis Sias, RN 05/12/2023 2:19 PM

## 2023-06-02 ENCOUNTER — Inpatient Hospital Stay
Admission: EM | Admit: 2023-06-02 | Discharge: 2023-06-08 | DRG: 640 | Disposition: A | Discharge status: Home Health | Payer: Medicare HMO | Attending: Internal Medicine | Admitting: Internal Medicine

## 2023-06-02 ENCOUNTER — Emergency Department: Payer: Medicare HMO

## 2023-06-02 ENCOUNTER — Encounter: Payer: Self-pay | Admitting: *Deleted

## 2023-06-02 ENCOUNTER — Other Ambulatory Visit: Payer: Self-pay

## 2023-06-02 DIAGNOSIS — Z888 Allergy status to other drugs, medicaments and biological substances status: Secondary | ICD-10-CM

## 2023-06-02 DIAGNOSIS — K529 Noninfective gastroenteritis and colitis, unspecified: Secondary | ICD-10-CM

## 2023-06-02 DIAGNOSIS — E871 Hypo-osmolality and hyponatremia: Principal | ICD-10-CM | POA: Diagnosis present

## 2023-06-02 DIAGNOSIS — Z801 Family history of malignant neoplasm of trachea, bronchus and lung: Secondary | ICD-10-CM

## 2023-06-02 DIAGNOSIS — F1010 Alcohol abuse, uncomplicated: Secondary | ICD-10-CM | POA: Diagnosis present

## 2023-06-02 DIAGNOSIS — Z8616 Personal history of COVID-19: Secondary | ICD-10-CM

## 2023-06-02 DIAGNOSIS — Z8249 Family history of ischemic heart disease and other diseases of the circulatory system: Secondary | ICD-10-CM

## 2023-06-02 DIAGNOSIS — Z791 Long term (current) use of non-steroidal anti-inflammatories (NSAID): Secondary | ICD-10-CM

## 2023-06-02 DIAGNOSIS — R066 Hiccough: Secondary | ICD-10-CM | POA: Diagnosis present

## 2023-06-02 DIAGNOSIS — E876 Hypokalemia: Secondary | ICD-10-CM | POA: Diagnosis present

## 2023-06-02 DIAGNOSIS — I10 Essential (primary) hypertension: Secondary | ICD-10-CM | POA: Diagnosis present

## 2023-06-02 DIAGNOSIS — N401 Enlarged prostate with lower urinary tract symptoms: Secondary | ICD-10-CM | POA: Diagnosis present

## 2023-06-02 DIAGNOSIS — Z79899 Other long term (current) drug therapy: Secondary | ICD-10-CM

## 2023-06-02 DIAGNOSIS — U071 COVID-19: Secondary | ICD-10-CM | POA: Diagnosis present

## 2023-06-02 DIAGNOSIS — E785 Hyperlipidemia, unspecified: Secondary | ICD-10-CM | POA: Diagnosis present

## 2023-06-02 DIAGNOSIS — J069 Acute upper respiratory infection, unspecified: Secondary | ICD-10-CM

## 2023-06-02 DIAGNOSIS — I480 Paroxysmal atrial fibrillation: Secondary | ICD-10-CM | POA: Diagnosis present

## 2023-06-02 DIAGNOSIS — Z87891 Personal history of nicotine dependence: Secondary | ICD-10-CM

## 2023-06-02 DIAGNOSIS — Z789 Other specified health status: Secondary | ICD-10-CM

## 2023-06-02 DIAGNOSIS — R197 Diarrhea, unspecified: Secondary | ICD-10-CM

## 2023-06-02 DIAGNOSIS — R350 Frequency of micturition: Secondary | ICD-10-CM

## 2023-06-02 DIAGNOSIS — R3589 Other polyuria: Secondary | ICD-10-CM | POA: Diagnosis present

## 2023-06-02 DIAGNOSIS — Z66 Do not resuscitate: Secondary | ICD-10-CM | POA: Diagnosis present

## 2023-06-02 DIAGNOSIS — R5381 Other malaise: Secondary | ICD-10-CM | POA: Diagnosis present

## 2023-06-02 DIAGNOSIS — K219 Gastro-esophageal reflux disease without esophagitis: Secondary | ICD-10-CM | POA: Diagnosis present

## 2023-06-02 LAB — COMPREHENSIVE METABOLIC PANEL
ALT: 42 U/L (ref 0–44)
AST: 41 U/L (ref 15–41)
Albumin: 4.4 g/dL (ref 3.5–5.0)
Alkaline Phosphatase: 75 U/L (ref 38–126)
Anion gap: 10 (ref 5–15)
BUN: 6 mg/dL — ABNORMAL LOW (ref 8–23)
CO2: 23 mmol/L (ref 22–32)
Calcium: 8.8 mg/dL — ABNORMAL LOW (ref 8.9–10.3)
Chloride: 85 mmol/L — ABNORMAL LOW (ref 98–111)
Creatinine, Ser: 0.41 mg/dL — ABNORMAL LOW (ref 0.61–1.24)
GFR, Estimated: >60 mL/min (ref >60)
Glucose, Bld: 93 mg/dL (ref 70–99)
Potassium: 4.1 mmol/L (ref 3.5–5.1)
Sodium: 118 mmol/L — CL (ref 135–145)
Total Bilirubin: 1.9 mg/dL — ABNORMAL HIGH (ref 0.3–1.2)
Total Protein: 8.3 g/dL — ABNORMAL HIGH (ref 6.5–8.1)

## 2023-06-02 LAB — CBC
HCT: 44.1 % (ref 39.0–52.0)
Hemoglobin: 15.7 g/dL (ref 13.0–17.0)
MCH: 31.6 pg (ref 26.0–34.0)
MCHC: 35.6 g/dL (ref 30.0–36.0)
MCV: 88.7 fL (ref 80.0–100.0)
Platelets: 162 10*3/uL (ref 150–400)
RBC: 4.97 MIL/uL (ref 4.22–5.81)
RDW: 11.6 % (ref 11.5–15.5)
WBC: 13.5 10*3/uL — ABNORMAL HIGH (ref 4.0–10.5)
nRBC: 0 % (ref 0.0–0.2)

## 2023-06-02 LAB — URINALYSIS, ROUTINE W REFLEX MICROSCOPIC
Bilirubin Urine: NEGATIVE
Glucose, UA: NEGATIVE mg/dL
Hgb urine dipstick: NEGATIVE
Ketones, ur: NEGATIVE mg/dL
Leukocytes,Ua: NEGATIVE
Nitrite: NEGATIVE
Protein, ur: NEGATIVE mg/dL
Specific Gravity, Urine: 1.003 — ABNORMAL LOW (ref 1.005–1.030)
pH: 7 (ref 5.0–8.0)

## 2023-06-02 LAB — LIPASE, BLOOD: Lipase: 28 U/L (ref 11–51)

## 2023-06-02 LAB — TROPONIN I (HIGH SENSITIVITY): Troponin I (High Sensitivity): 5 ng/L (ref <18)

## 2023-06-02 MED ORDER — THIAMINE HCL 100 MG/ML IJ SOLN: 100.0000 mg | Freq: Every day | INTRAMUSCULAR | Status: DC

## 2023-06-02 MED ORDER — LORAZEPAM 1 MG PO TABS: 1.0000 mg | ORAL_TABLET | ORAL | Status: AC | PRN

## 2023-06-02 MED ORDER — LORAZEPAM 2 MG/ML IJ SOLN: 1.0000 mg | INTRAMUSCULAR | Status: AC | PRN

## 2023-06-02 MED ORDER — METRONIDAZOLE 500 MG/100ML IV SOLN
500.0000 mg | Freq: Two times a day (BID) | INTRAVENOUS | Status: DC
  Administered 2023-06-03 – 2023-06-04 (×3): 500 mg via INTRAVENOUS
  Filled 2023-06-02 (×5): qty 100

## 2023-06-02 MED ORDER — SODIUM CHLORIDE 1 G PO TABS
1.0000 g | ORAL_TABLET | Freq: Once | ORAL | Status: AC
  Administered 2023-06-03: 1 g via ORAL
  Filled 2023-06-02: qty 1

## 2023-06-02 MED ORDER — FOLIC ACID 1 MG PO TABS
1.0000 mg | ORAL_TABLET | Freq: Every day | ORAL | Status: DC
  Administered 2023-06-03 – 2023-06-08 (×6): 1 mg via ORAL
  Filled 2023-06-02 (×6): qty 1

## 2023-06-02 MED ORDER — ONDANSETRON HCL 4 MG/2ML IJ SOLN
4.0000 mg | Freq: Four times a day (QID) | INTRAMUSCULAR | Status: DC | PRN
  Administered 2023-06-03 – 2023-06-07 (×2): 4 mg via INTRAVENOUS
  Filled 2023-06-02 (×2): qty 2

## 2023-06-02 MED ORDER — SODIUM CHLORIDE 3 % IV SOLN
INTRAVENOUS | Status: DC
  Filled 2023-06-02: qty 500

## 2023-06-02 MED ORDER — SODIUM CHLORIDE 0.9 % IV BOLUS: 500.0000 mL | Freq: Once | INTRAVENOUS | Status: DC

## 2023-06-02 MED ORDER — ADULT MULTIVITAMIN W/MINERALS CH
1.0000 | ORAL_TABLET | Freq: Every day | ORAL | Status: DC
  Administered 2023-06-03 – 2023-06-08 (×6): 1 via ORAL
  Filled 2023-06-02 (×6): qty 1

## 2023-06-02 MED ORDER — FAMOTIDINE 20 MG PO TABS
20.0000 mg | ORAL_TABLET | ORAL | Status: AC
  Administered 2023-06-03: 20 mg via ORAL
  Filled 2023-06-02: qty 1

## 2023-06-02 MED ORDER — PANTOPRAZOLE SODIUM 40 MG IV SOLR
40.0000 mg | INTRAVENOUS | Status: DC
  Administered 2023-06-03 – 2023-06-04 (×3): 40 mg via INTRAVENOUS
  Filled 2023-06-02 (×3): qty 10

## 2023-06-02 MED ORDER — SODIUM CHLORIDE 0.9 % IV SOLN
1.0000 g | INTRAVENOUS | Status: DC
  Administered 2023-06-03 – 2023-06-04 (×2): 1 g via INTRAVENOUS
  Filled 2023-06-02 (×3): qty 10

## 2023-06-02 MED ORDER — THIAMINE MONONITRATE 100 MG PO TABS
100.0000 mg | ORAL_TABLET | Freq: Every day | ORAL | Status: DC
  Administered 2023-06-03 – 2023-06-08 (×6): 100 mg via ORAL
  Filled 2023-06-02 (×6): qty 1

## 2023-06-02 NOTE — Assessment & Plan Note (Addendum)
[name] reporting frequent urination.  Urinalysis is benign.  Patient seems to have history of BPH in the past. -Outpatient urology evaluation

## 2023-06-02 NOTE — ED Notes (Signed)
Pt changed out of soiled clothing and assisted to urinate in urinal. Pt cleaned and new brief and hospital gown applied. Pt assisted into hospital stretcher with hall fall alarm initiated. Pt weak with need for 2 assisted when ambulating or pivoting.   **Pt reports he was discharged from [place] facility on Friday due to inability to continue paying over $7K a month. Per pt, since being back home where he lives alone, he has increased in weakness and decreased in the ability to eat or drink. Pt also reports that home health was to be set up prior to his release but as of today, no one has showed up or contacted pt of timeline when services starting.

## 2023-06-02 NOTE — ED Triage Notes (Signed)
Pt reports since being home, he has had problems with his reflux, indigestion, diarrhea, says he is urinating more frequently. Had a beer this morning.

## 2023-06-02 NOTE — ED Provider Notes (Signed)
[facility] Provider Note    Event Date/Time   First MD Initiated Contact with Patient 06/02/23 2254     (approximate)   History   Diarrhea   HPI Douglas Linville. is a 72 y.o. male well-known to the emergency department with many prior visits and periods of boarding due largely to his chronic alcohol abuse though he has many other chronic medical issues as well including paroxysmal atrial fibrillation, hypertension, dyslipidemia, etc.  He was last hospitalized about a month ago due to hyponatremia.  He presents tonight with chief complaints of acid reflux, diarrhea, urinary frequency, and some generalized weakness.  History is somewhat limited but he claims he has been not drinking much alcohol though he admits to having a beer earlier today.  He states that he has some abdominal cramps but no significant abdominal pain.  He denies fever, chest pain, and shortness of breath.  Occasional nausea and vomiting, mostly a burning sensation in his upper abdomen consistent with acid reflux.     Physical Exam   Triage Vital Signs: ED Triage Vitals  Encounter Vitals Group     BP 06/02/23 2122 (!) 171/85     Systolic BP Percentile --      Diastolic BP Percentile --      Pulse Rate 06/02/23 2122 74     Resp 06/02/23 2122 20     Temp 06/02/23 2122 98.7 F (37.1 C)     Temp src --      SpO2 06/02/23 2122 97 %     Weight --      Height --      Head Circumference --      Peak Flow --      Pain Score 06/02/23 2120 10     Pain Loc --      Pain Education --      Exclude from Growth Chart --     Most recent vital signs: Vitals:   06/02/23 2122  BP: (!) 171/85  Pulse: 74  Resp: 20  Temp: 98.7 F (37.1 C)  SpO2: 97%    General: Awake, alert, pleasant and communicative, appears better and more healthy than the last time that I saw him.  Still disheveled but not in acute distress. CV:  Good peripheral perfusion.  Regular rate and rhythm Resp:  Normal  effort. Speaking easily and comfortably, no accessory muscle usage nor intercostal retractions.  Lungs clear to auscultation. Abd:  No distention.  No tenderness to palpation of the abdomen.   ED Results / Procedures / Treatments   Labs (all labs ordered are listed, but only abnormal results are displayed) Labs Reviewed  COMPREHENSIVE METABOLIC PANEL - Abnormal; Notable for the following components:      Result Value   Sodium 118 (*)    Chloride 85 (*)    BUN 6 (*)    Creatinine, Ser 0.41 (*)    Calcium 8.8 (*)    Total Protein 8.3 (*)    Total Bilirubin 1.9 (*)    All other components within normal limits  CBC - Abnormal; Notable for the following components:   WBC 13.5 (*)    All other components within normal limits  URINALYSIS, ROUTINE W REFLEX MICROSCOPIC - Abnormal; Notable for the following components:   Color, Urine STRAW (*)    APPearance CLEAR (*)    Specific Gravity, Urine 1.003 (*)    All other components within normal limits  C DIFFICILE QUICK SCREEN W  PCR REFLEX    GASTROINTESTINAL PANEL BY PCR, STOOL (REPLACES STOOL CULTURE)  LIPASE, BLOOD  SODIUM, URINE, RANDOM  TROPONIN I (HIGH SENSITIVITY)     EKG  ED ECG REPORT I, Loleta Rose, the attending physician, personally viewed and interpreted this ECG.  Date: 06/01/2023 EKG Time: 21: 31 Rate: 69 Rhythm: normal sinus rhythm QRS Axis: normal Intervals: normal ST/T Wave abnormalities: normal Narrative Interpretation: no evidence of acute ischemia    PROCEDURES:  Critical Care performed: No  Procedures    IMPRESSION / MDM / ASSESSMENT AND PLAN / ED COURSE  I reviewed the triage vital signs and the nursing notes.                              Differential diagnosis includes, but is not limited to, hyponatremia or other acute metabolic/electrolyte abnormality, renal failure, acute urinary tract infection, alcoholic ketoacidosis.  Patient's presentation is most consistent with acute presentation  with potential threat to life or bodily function.  Labs/studies ordered: CMP, CBC, lipase, high-sensitivity troponin, EKG  Interventions/Medications given:  Medications  sodium chloride tablet 1 g (has no administration in time range)  pantoprazole (PROTONIX) injection 40 mg (has no administration in time range)  LORazepam (ATIVAN) tablet 1-4 mg (has no administration in time range)    Or  LORazepam (ATIVAN) injection 1-4 mg (has no administration in time range)  thiamine (VITAMIN B1) tablet 100 mg (has no administration in time range)    Or  thiamine (VITAMIN B1) injection 100 mg (has no administration in time range)  folic acid (FOLVITE) tablet 1 mg (has no administration in time range)  multivitamin with minerals tablet 1 tablet (has no administration in time range)    (Note:  hospital course my include additional interventions and/or labs/studies not listed above.)   [name] stable with normal vital signs other than hypertension.  He is more awake and alert and communicative than usual with an appropriate mood and affect, pleasant and interactive.  Reassuring physical exam.  He again presents with substantial hyponatremia.  I read his discharge summary from about a month ago on 05/01/2023 written by Dr. Joylene Igo and he presented with a similar presentation although his potassium tonight is within normal limits.  I will order a small fluid bolus of 500 mL normal saline and consult the hospitalist for admission.  No indication for imaging at this time.    Clinical Course as of 06/02/23 2338  Tue Jun 02, 2023  2326 Consulted with Dr. Maryjean Ka with the hospitalist service who will admit the patient.  He requested that I canceled the initial fluid bolus so that he could manage the fluids which I think is very reasonable.  I also informed Dr. Maryjean Ka of the patient's alcohol dependence and recommended CIWA protocol while the patient is being treated inpatient. [CF]    Clinical Course User  Index [CF] Loleta Rose, MD     FINAL CLINICAL IMPRESSION(S) / ED DIAGNOSES   Final diagnoses:  Hyponatremia  Diarrhea, unspecified type  Increased urinary frequency     Rx / DC Orders   ED Discharge Orders     None        Note:  This document was prepared using Dragon voice recognition software and may include unintentional dictation errors.   Loleta Rose, MD 06/02/23 [id number]

## 2023-06-02 NOTE — Assessment & Plan Note (Addendum)
This is severe, not associated with severe symptoms, however is symptomatic, and has evolved over uncertain period of time, seem to be chronic.  My guess is that this is prerenal due to patient having had sodium loss in his diarrhea.  Therefore we will check urine sodium, and ultimate plan will be to treat this patient with normal saline.  However pending urine sodium, I will treat the patient with hypertonic saline at 30 cc an hour with frequent sodium checks.  I believe that the patient is euvolemic at this time because patient actually has had no vomiting and has been able to tolerate p.o. diet, his blood pressure is okay creatinines okay.
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
## 2023-06-02 NOTE — Assessment & Plan Note (Signed)
I counseled patient against high risk alcohol use.  At this time patient reports that he has previously not had alcohol withdrawal.  Review of record indicates that patient has had withdrawal in the past.  I will have as needed benzodiazepines handy for patient in case he goes into withdrawal

## 2023-06-02 NOTE — ED Triage Notes (Signed)
Pt arrives via [facility] from home. Per medic report, He was d/c from assisted living facility on the 26th to home. Reports since being home, he has had diarrhea, heart burn and unable to urinate. He urinated x2 for EMS. En route 168/74, hr 70's. Contracture on right side. Uses w/c at home.

## 2023-06-02 NOTE — Assessment & Plan Note (Addendum)
There was some concern of enteritis as he had diarrhea recently.  No bowel movement while in the hospital.  C. difficile testing was ordered and later was discontinued. -Supportive care

## 2023-06-03 ENCOUNTER — Emergency Department: Payer: Medicare HMO

## 2023-06-03 ENCOUNTER — Other Ambulatory Visit: Payer: Self-pay

## 2023-06-03 DIAGNOSIS — Z66 Do not resuscitate: Secondary | ICD-10-CM | POA: Diagnosis present

## 2023-06-03 DIAGNOSIS — Z801 Family history of malignant neoplasm of trachea, bronchus and lung: Secondary | ICD-10-CM | POA: Diagnosis not present

## 2023-06-03 DIAGNOSIS — R066 Hiccough: Secondary | ICD-10-CM | POA: Diagnosis present

## 2023-06-03 DIAGNOSIS — K219 Gastro-esophageal reflux disease without esophagitis: Secondary | ICD-10-CM | POA: Diagnosis present

## 2023-06-03 DIAGNOSIS — N401 Enlarged prostate with lower urinary tract symptoms: Secondary | ICD-10-CM | POA: Diagnosis present

## 2023-06-03 DIAGNOSIS — Z8249 Family history of ischemic heart disease and other diseases of the circulatory system: Secondary | ICD-10-CM | POA: Diagnosis not present

## 2023-06-03 DIAGNOSIS — F1010 Alcohol abuse, uncomplicated: Secondary | ICD-10-CM

## 2023-06-03 DIAGNOSIS — E785 Hyperlipidemia, unspecified: Secondary | ICD-10-CM | POA: Diagnosis present

## 2023-06-03 DIAGNOSIS — R3589 Other polyuria: Secondary | ICD-10-CM

## 2023-06-03 DIAGNOSIS — Z789 Other specified health status: Secondary | ICD-10-CM | POA: Diagnosis not present

## 2023-06-03 DIAGNOSIS — K529 Noninfective gastroenteritis and colitis, unspecified: Secondary | ICD-10-CM | POA: Diagnosis present

## 2023-06-03 DIAGNOSIS — I480 Paroxysmal atrial fibrillation: Secondary | ICD-10-CM | POA: Diagnosis present

## 2023-06-03 DIAGNOSIS — E871 Hypo-osmolality and hyponatremia: Secondary | ICD-10-CM | POA: Diagnosis present

## 2023-06-03 DIAGNOSIS — E876 Hypokalemia: Secondary | ICD-10-CM | POA: Diagnosis present

## 2023-06-03 DIAGNOSIS — J069 Acute upper respiratory infection, unspecified: Secondary | ICD-10-CM | POA: Diagnosis present

## 2023-06-03 DIAGNOSIS — I4891 Unspecified atrial fibrillation: Secondary | ICD-10-CM | POA: Diagnosis not present

## 2023-06-03 DIAGNOSIS — Z791 Long term (current) use of non-steroidal anti-inflammatories (NSAID): Secondary | ICD-10-CM | POA: Diagnosis not present

## 2023-06-03 DIAGNOSIS — Z87891 Personal history of nicotine dependence: Secondary | ICD-10-CM | POA: Diagnosis not present

## 2023-06-03 DIAGNOSIS — I10 Essential (primary) hypertension: Secondary | ICD-10-CM | POA: Diagnosis present

## 2023-06-03 DIAGNOSIS — R197 Diarrhea, unspecified: Secondary | ICD-10-CM | POA: Diagnosis not present

## 2023-06-03 DIAGNOSIS — Z8616 Personal history of COVID-19: Secondary | ICD-10-CM | POA: Diagnosis not present

## 2023-06-03 DIAGNOSIS — Z79899 Other long term (current) drug therapy: Secondary | ICD-10-CM | POA: Diagnosis not present

## 2023-06-03 DIAGNOSIS — U071 COVID-19: Secondary | ICD-10-CM | POA: Diagnosis present

## 2023-06-03 DIAGNOSIS — Z888 Allergy status to other drugs, medicaments and biological substances status: Secondary | ICD-10-CM | POA: Diagnosis not present

## 2023-06-03 DIAGNOSIS — R350 Frequency of micturition: Secondary | ICD-10-CM | POA: Diagnosis present

## 2023-06-03 DIAGNOSIS — R5381 Other malaise: Secondary | ICD-10-CM | POA: Diagnosis present

## 2023-06-03 LAB — CBC
HCT: 48.5 % (ref 39.0–52.0)
Hemoglobin: 17.2 g/dL — ABNORMAL HIGH (ref 13.0–17.0)
MCH: 32.3 pg (ref 26.0–34.0)
MCHC: 35.5 g/dL (ref 30.0–36.0)
MCV: 91 fL (ref 80.0–100.0)
Platelets: 134 10*3/uL — ABNORMAL LOW (ref 150–400)
RBC: 5.33 MIL/uL (ref 4.22–5.81)
RDW: 11.6 % (ref 11.5–15.5)
WBC: 7.6 10*3/uL (ref 4.0–10.5)
nRBC: 0 % (ref 0.0–0.2)

## 2023-06-03 LAB — GLUCOSE, CAPILLARY
Glucose-Capillary: 88 mg/dL (ref 70–99)
Glucose-Capillary: 80 mg/dL (ref 70–99)
Glucose-Capillary: 109 mg/dL — ABNORMAL HIGH (ref 70–99)
Glucose-Capillary: 100 mg/dL — ABNORMAL HIGH (ref 70–99)

## 2023-06-03 LAB — PROTIME-INR
INR: 1.2 (ref 0.8–1.2)
Prothrombin Time: 15.2 seconds (ref 11.4–15.2)

## 2023-06-03 LAB — BASIC METABOLIC PANEL
Anion gap: 9 (ref 5–15)
BUN: 6 mg/dL — ABNORMAL LOW (ref 8–23)
CO2: 25 mmol/L (ref 22–32)
Calcium: 9 mg/dL (ref 8.9–10.3)
Chloride: 96 mmol/L — ABNORMAL LOW (ref 98–111)
Creatinine, Ser: 0.49 mg/dL — ABNORMAL LOW (ref 0.61–1.24)
GFR, Estimated: >60 mL/min (ref >60)
Glucose, Bld: 83 mg/dL (ref 70–99)
Potassium: 3.9 mmol/L (ref 3.5–5.1)
Sodium: 130 mmol/L — ABNORMAL LOW (ref 135–145)

## 2023-06-03 LAB — APTT: aPTT: 27 seconds (ref 24–36)

## 2023-06-03 LAB — OSMOLALITY: Osmolality: 268 mOsm/kg — ABNORMAL LOW (ref 275–295)

## 2023-06-03 LAB — MRSA NEXT GEN BY PCR, NASAL: MRSA by PCR Next Gen: NOT DETECTED

## 2023-06-03 MED ORDER — ORAL CARE MOUTH RINSE: 15.0000 mL | OROMUCOSAL | Status: DC | PRN

## 2023-06-03 MED ORDER — ENOXAPARIN SODIUM 40 MG/0.4ML IJ SOSY
40.0000 mg | PREFILLED_SYRINGE | INTRAMUSCULAR | Status: DC
  Administered 2023-06-04 – 2023-06-08 (×5): 40 mg via SUBCUTANEOUS
  Filled 2023-06-03 (×5): qty 0.4

## 2023-06-03 MED ORDER — CHLORHEXIDINE GLUCONATE CLOTH 2 % EX PADS
6.0000 | MEDICATED_PAD | Freq: Every day | CUTANEOUS | Status: DC
  Administered 2023-06-03 – 2023-06-06 (×5): 6 via TOPICAL

## 2023-06-03 MED ORDER — BACLOFEN 10 MG PO TABS
10.0000 mg | ORAL_TABLET | Freq: Three times a day (TID) | ORAL | Status: DC
  Administered 2023-06-03 – 2023-06-08 (×16): 10 mg via ORAL
  Filled 2023-06-03 (×17): qty 1

## 2023-06-03 MED ORDER — ATORVASTATIN CALCIUM 10 MG PO TABS
10.0000 mg | ORAL_TABLET | Freq: Every day | ORAL | Status: DC
  Administered 2023-06-03 – 2023-06-08 (×6): 10 mg via ORAL
  Filled 2023-06-03 (×6): qty 1

## 2023-06-03 MED ORDER — ATORVASTATIN CALCIUM 10 MG PO TABS
10.0000 mg | ORAL_TABLET | Freq: Every day | ORAL | 0 refills | Status: DC
  Filled 2023-06-03: qty 30, 30d supply, fill #0

## 2023-06-03 MED ORDER — GUAIFENESIN ER 600 MG PO TB12
1200.0000 mg | ORAL_TABLET | Freq: Once | ORAL | Status: AC
  Administered 2023-06-03: 1200 mg via ORAL
  Filled 2023-06-03: qty 2

## 2023-06-03 MED ORDER — SODIUM CHLORIDE 0.9% FLUSH
3.0000 mL | Freq: Two times a day (BID) | INTRAVENOUS | Status: DC
  Administered 2023-06-03 – 2023-06-08 (×12): 3 mL via INTRAVENOUS

## 2023-06-03 MED ORDER — ATENOLOL 25 MG PO TABS
25.0000 mg | ORAL_TABLET | Freq: Every day | ORAL | 0 refills | Status: DC
  Filled 2023-06-03: qty 30, 30d supply, fill #0

## 2023-06-03 MED ORDER — TAMSULOSIN HCL 0.4 MG PO CAPS
0.4000 mg | ORAL_CAPSULE | Freq: Every day | ORAL | Status: DC
  Administered 2023-06-03 – 2023-06-08 (×6): 0.4 mg via ORAL
  Filled 2023-06-03 (×6): qty 1

## 2023-06-03 MED ORDER — ATENOLOL 25 MG PO TABS
25.0000 mg | ORAL_TABLET | Freq: Every day | ORAL | Status: DC
  Administered 2023-06-03 – 2023-06-08 (×6): 25 mg via ORAL
  Filled 2023-06-03 (×6): qty 1
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
## 2023-06-03 NOTE — TOC Transition Note (Signed)
Transition of Care [facility]) - CM/SW Discharge Note   Patient Details  Name: Douglas Peterson. MRN: [id number] Date of Birth: [birth year]  [facility]) CM/SW Contact:  Kreg Shropshire, RN Phone Number: 06/03/2023, 4:32 PM   Clinical Narrative:    D/c orders are in for pt. Md ordered [facility] PT/OT. Pt had no preference. Cm sent referral to [facility] and confirmed with [name]. TOC signing off     Barriers to Discharge: Barriers Resolved   Patient Goals and CMS Choice CMS Medicare.gov Compare Post Acute Care list provided to:: Patient Choice offered to / list presented to : Patient  Discharge Placement                         Discharge Plan and Services Additional resources added to the After Visit Summary for       Post Acute Care Choice: Home Health, Skilled Nursing Facility                    HH Arranged: PT, OT [facility] Agency: [name] Date [facility] Agency Contacted: 06/03/23 Time HH Agency Contacted: 1632 Representative spoke with at [facility] Agency: [name] with [name]  Social Determinants of Health (SDOH) Interventions SDOH Screenings   Food Insecurity: No Food Insecurity (06/03/2023)  Housing: Low Risk  (06/03/2023)  Transportation Needs: No Transportation Needs (06/03/2023)  Utilities: Not At Risk (06/03/2023)  Financial Resource Strain: Unknown (06/13/2019)  Physical Activity: Unknown (06/13/2019)  Social Connections: Unknown (06/13/2019)  Stress: Unknown (06/13/2019)  Tobacco Use: Medium Risk (06/02/2023)     Readmission Risk Interventions    04/29/2022   10:26 AM 03/14/2022   12:48 PM  Readmission Risk Prevention Plan  Transportation Screening Complete Complete  PCP or Specialist Appt within 3-5 Days  Complete  HRI or Home Care Consult Complete Complete  Social Work Consult for Recovery Care Planning/Counseling Complete Complete  Palliative Care Screening Not Applicable Not Applicable  Medication Review Oceanographer) Complete  Complete
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
## 2023-06-03 NOTE — Evaluation (Signed)
Physical Therapy Evaluation Patient Details Name: Douglas Peterson. MRN: [id number] DOB: 1950/11/17 Today's Date: 06/03/2023  History of Present Illness  Pt. is a 72 y.o. male with medical history significant of chronic alcohol abuse, with several related admissions.  Patient was in his usual state of health till approximately 6 days ago when he reports a new onset of diarrhea and "reflux "like symptoms suggestive of burning sensation in epigastric area with sour brash.  Patient reports 3 episodes of watery brown movement every day.  Patient has had no vomiting, has been able to tolerate p.o. diet.  Has continued to drink beer/water.  Clinical Impression  Pt received in bed, he is agreeable for PT session. Pt demonstrates increased difficulty with sit>stand and stand pivot into recliner when only wearing hospital socks as seen by increased unsteadiness requiring minA x1 from PT. Pt improved standing and amb when wearing shoes and using SPC on LUE but had an episode of unsteadiness with retropulsion requiring minA for correction. Pt reports desire to go home and not SNF upon discharge. Pt will cont to benefit from skilled PT to focus on gait and functional mobility.      If plan is discharge home, recommend the following: Help with stairs or ramp for entrance;Direct supervision/assist for medications management;Assist for transportation;A little help with walking and/or transfers;A little help with bathing/dressing/bathroom   Can travel by private vehicle   Yes    Equipment Recommendations None recommended by PT  Recommendations for Other Services       Functional Status Assessment Patient has had a recent decline in their functional status and demonstrates the ability to make significant improvements in function in a reasonable and predictable amount of time.     Precautions / Restrictions Precautions Precautions: Fall Restrictions Weight Bearing Restrictions: No      Mobility  Bed  Mobility Overal bed mobility: Independent Bed Mobility: Sit to Supine     Supine to sit: Independent     General bed mobility comments: Pt. sitting in recliner at the end of session.    Transfers Overall transfer level: Modified independent Equipment used: Straight cane Transfers: Sit to/from Stand Sit to Stand: Modified independent (Device/Increase time), Min guard   Step pivot transfers: Modified independent (Device/Increase time), Min guard       General transfer comment: minA supine>sit and stand pivot transfer, CGA following shoes    Ambulation/Gait Ambulation/Gait assistance: Modified independent (Device/Increase time) Gait Distance (Feet): 150 Feet Assistive device: Straight cane Gait Pattern/deviations: Decreased stance time - right, Decreased step length - left, Step-to pattern, Shuffle, Decreased stance time - left, Decreased step length - right, Decreased stride length Gait velocity: decreased     General Gait Details: amb approx 150' around unit with CGA with occasional minA due to unsteadiness  Stairs            Wheelchair Mobility     Tilt Bed    Modified Rankin (Stroke Patients Only)       Balance Overall balance assessment: Needs assistance Sitting-balance support: Feet supported, Bilateral upper extremity supported Sitting balance-Leahy Scale: Good   Postural control: Other (comment) (unsteadiness from sock placement but improved with shoes) Standing balance support: During functional activity, Single extremity supported, Reliant on assistive device for balance Standing balance-Leahy Scale: Fair                               Pertinent Vitals/Pain Pain Assessment Pain Assessment:  No/denies pain    Home Living Family/patient expects to be discharged to:: Private residence Living Arrangements: Alone Available Help at Discharge: Neighbor;Available PRN/intermittently Type of Home: House Home Access: Stairs to  enter Entrance Stairs-Rails: Can reach both Entrance Stairs-Number of Steps: 2 Alternate Level Stairs-Number of Steps: Unspecified layout Home Layout: Two level Home Equipment: Cane - single point      Prior Function Prior Level of Function : Independent/Modified Independent             Mobility Comments: Pt uses SPC for mobility PRN. Described community ambulator. No falls history.       Hand Dominance   Dominant Hand: Left    Extremity/Trunk Assessment   Upper Extremity Assessment Upper Extremity Assessment: Generalized weakness;RUE deficits/detail RUE Deficits / Details: chronic RUE contracture s/p CVAs RUE: Unable to fully assess due to pain            Communication   Communication: Other (comment) (Low tone, difficulty to understand at times, mumbling)  Cognition Arousal/Alertness: Awake/alert Behavior During Therapy: WFL for tasks assessed/performed Overall Cognitive Status: Within Functional Limits for tasks assessed                                 General Comments: slow soft speech        General Comments      Exercises     Assessment/Plan    PT Assessment Patient needs continued PT services  PT Problem List Decreased strength;Decreased activity tolerance;Decreased balance;Decreased mobility;Impaired tone       PT Treatment Interventions DME instruction;Gait training;Balance training;Stair training;Therapeutic activities;Patient/family education    PT Goals (Current goals can be found in the Care Plan section)  Acute Rehab PT Goals Patient Stated Goal: to go home PT Goal Formulation: With patient Time For Goal Achievement: 06/17/23 Potential to Achieve Goals: Fair    Frequency Min 1X/week     Co-evaluation               AM-PAC PT "6 Clicks" Mobility  Outcome Measure Help needed turning from your back to your side while in a flat bed without using bedrails?: None Help needed moving from lying on your back to  sitting on the side of a flat bed without using bedrails?: None Help needed moving to and from a bed to a chair (including a wheelchair)?: A Little Help needed standing up from a chair using your arms (e.g., wheelchair or bedside chair)?: A Little Help needed to walk in hospital room?: A Little Help needed climbing 3-5 steps with a railing? : A Lot 6 Click Score: 19    End of Session Equipment Utilized During Treatment: Gait belt Activity Tolerance: Patient tolerated treatment well Patient left: in chair;with call bell/phone within reach Nurse Communication: Mobility status PT Visit Diagnosis: Unsteadiness on feet (R26.81);Difficulty in walking, not elsewhere classified (R26.2);Muscle weakness (generalized) (M62.81)    Time: 4782-9562 PT Time Calculation (min) (ACUTE ONLY): 22 min   Charges:   PT Evaluation $PT Eval Moderate Complexity: 1 Mod PT Treatments $Gait Training: 8-22 mins PT General Charges $$ ACUTE PT VISIT: 1 Visit         , PT, GCS 06/03/23,4:03 PM

## 2023-06-03 NOTE — Hospital Course (Addendum)
Taken from H&P.  Douglas Peterson. is a 72 y.o. male with medical history significant of chronic alcohol abuse, with several related admissions came with diarrhea and reflux-like symptoms for the past few days. No nausea or vomiting and he has been able to tolerate diet.  He continues to drink beer and water.  Having worsening generalized weakness.  Found to have significant hyponatremia with sodium of 118 and was started on hypertonic saline.  7/31: Vital stable with mildly elevated blood pressure.  Sodium improved to 130 so hypertonic saline was discontinued.  His baseline seems to be around 128- 130, likely secondary to alcohol abuse and excessive water intake as hyponatremia labs with hypoosmolar hyponatremia, with significantly decreased urine and serum osmolality.  Urine sodium of 36. No bowel movement since in the hospital so C. difficile testing was discontinued.  PT is recommending home health which was ordered.  Patient was counseled again for drinking excessive beer and free water.  He will continue on current medications and need to have a close follow-up with his providers for further recommendations.
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
## 2023-06-03 NOTE — Progress Notes (Signed)
Able to obtain USPIV on left lateral upper arm, pt.'s R arm contracted, w/ 2 PIVs on L FA. RN  made aware and instructed not to infuse 3% NS or any vas pressor on the upper arm PIV. Verbalized understanding.

## 2023-06-03 NOTE — TOC Initial Note (Signed)
Transition of Care (TOC) - Initial/Assessment Note    Patient Details  Name: Douglas Peterson. MRN: [id number] Date of Birth: 12/29/1950  Transition [facility]) CM/SW Contact:    Kreg Shropshire, RN Phone Number: 06/03/2023, 10:25 AM  Clinical Narrative:                 Cm received consult for SA. Pt stated he does not want any resources. He has not had a drink in a while. In the past, he use to drink excessive ETOH  Pt arrived from ED from: Home Lives Alone Caregiver Support: Daughter [name] DME at Home: Wheelchair and walker Transportation: He stated he drives himself Previous Services: Previously at [facility] a few days ago. Was only there for 3 days for STR HH/SNF Preference: [facility] if needed. First Person of Contact: Daughter [name] PCP: Melonie Florida, FNP   Cm will continue to follow for toc needs and d/c planning.    Barriers to Discharge: Continued Medical Work up   Patient Goals and CMS Choice   CMS Medicare.gov Compare Post Acute Care list provided to:: Patient Choice offered to / list presented to : Patient      Expected Discharge Plan and Services     Post Acute Care Choice: Home Health, Skilled Nursing Facility Living arrangements for the past 2 months: Skilled Nursing Facility, Single Family Home                                      Prior Living Arrangements/Services Living arrangements for the past 2 months: Skilled Nursing Facility, Single Family Home Lives with:: Self   Do you feel safe going back to the place where you live?: Yes      Need for Family Participation in Patient Care: Yes (Comment) Care giver support system in place?: Yes (comment) Current home services: DME Criminal Activity/Legal Involvement Pertinent to Current Situation/Hospitalization: Yes - Comment as needed  Activities of Daily Living      Permission Sought/Granted                  Emotional Assessment Appearance:: Appears younger than stated  age, Appears stated age Attitude/Demeanor/Rapport: Engaged Affect (typically observed): Calm Orientation: : Oriented to Self, Oriented to Place, Oriented to  Time, Oriented to Situation      Admission diagnosis:  Hyponatremia [E87.1] Increased urinary frequency [R35.0] Diarrhea, unspecified type [R19.7] Patient Active Problem List   Diagnosis Date Noted   Enteritis 06/02/2023   Polyuria 06/02/2023   Dyslipidemia 05/01/2023   Alcohol dependence (HCC) 05/01/2023   Maggot infestation feet 05/11/2022   Unable to care for self 05/11/2022   Debility 04/25/2022   Weakness 04/23/2022   Epistaxis 04/23/2022   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   Generalized weakness 03/18/2022   Nondisplaced fracture of shaft of left clavicle, initial encounter for closed fracture 03/18/2022   BPH (benign prostatic hyperplasia) 03/18/2022   Clavicle fracture 03/17/2022   Fall 03/17/2022   CAP (community acquired pneumonia) 03/13/2022   Altered mental status, unspecified 02/20/2022   Hypotension 02/19/2022   Dysphonia 08/13/2021   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   Paresthesia of right upper extremity 08/13/2021   SIRS (systemic inflammatory response syndrome) (HCC) 08/05/2020   GERD (gastroesophageal reflux disease) 06/05/2020   Alcohol use disorder, severe, dependence (HCC) 05/28/2020   Paroxysmal A-fib (HCC) 03/17/2020   Primary insomnia 02/06/2020   Left  inguinal hernia 01/31/2020   Encounter for competency evaluation    Depression 01/17/2020   Pancytopenia (HCC)    Hypomagnesemia    Alcoholic cirrhosis of liver without ascites (HCC)    ETOH abuse    Alcohol intoxication (HCC)    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) 09/27/2019   Hypokalemia 09/27/2019   Alcohol withdrawal delirium, acute, hyperactive (HCC) 09/21/2019   Pressure injury of ankle, stage 1 08/15/2019   Palliative care by specialist    Closed fracture of right proximal humerus 03/19/2019    Vitamin D deficiency 01/11/2019   Osteoporosis 12/22/2018   Closed nondisplaced fracture of acromial process of right scapula with routine healing 11/15/2018   Compression fracture of L2 vertebra with routine healing 11/15/2018   Delirium tremens (HCC) 03/08/2018   Essential hypertension 09/16/2017   Encephalopathy, 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   Steatohepatitis 12/03/2016   Abnormal liver enzymes 06/17/2016   Hereditary hemochromatosis (HCC) 06/17/2016   Anxiety 07/16/2015   Hyponatremia 07/09/2015   H/O traumatic brain injury 05/22/2014   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HCC) 05/22/2014   PCP:  Melonie Florida, FNP Pharmacy:   [facility] - [place], [place] - [address] [place] [address] Phone: [phone] Fax: [phone]     Social Determinants of Health (SDOH) Social History: SDOH Screenings   Food Insecurity: No Food Insecurity (05/01/2023)  Housing: Low Risk  (05/01/2023)  Transportation Needs: No Transportation Needs (05/01/2023)  Utilities: Not At Risk (05/01/2023)  Financial Resource Strain: Unknown (06/13/2019)  Physical Activity: Unknown (06/13/2019)  Social Connections: Unknown (06/13/2019)  Stress: Unknown (06/13/2019)  Tobacco Use: Medium Risk (06/02/2023)   SDOH Interventions:     Readmission Risk Interventions    04/29/2022   10:26 AM 03/14/2022   12:48 PM  Readmission Risk Prevention Plan  Transportation Screening Complete Complete  PCP or Specialist Appt within 3-5 Days  Complete  HRI or Home Care Consult Complete Complete  Social Work Consult for Recovery Care Planning/Counseling Complete Complete  Palliative Care Screening Not Applicable Not Applicable  Medication Review Oceanographer) Complete Complete
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
## 2023-06-04 ENCOUNTER — Inpatient Hospital Stay: Payer: Medicare HMO

## 2023-06-04 ENCOUNTER — Other Ambulatory Visit: Payer: Self-pay

## 2023-06-04 DIAGNOSIS — J069 Acute upper respiratory infection, unspecified: Secondary | ICD-10-CM | POA: Diagnosis not present

## 2023-06-04 DIAGNOSIS — E871 Hypo-osmolality and hyponatremia: Secondary | ICD-10-CM | POA: Diagnosis not present

## 2023-06-04 LAB — RESPIRATORY PANEL BY PCR

## 2023-06-04 LAB — CBC
HCT: 43.9 % (ref 39.0–52.0)
Hemoglobin: 15.5 g/dL (ref 13.0–17.0)
MCH: 32.4 pg (ref 26.0–34.0)
MCHC: 35.3 g/dL (ref 30.0–36.0)
MCV: 91.8 fL (ref 80.0–100.0)
Platelets: 143 10*3/uL — ABNORMAL LOW (ref 150–400)
RBC: 4.78 MIL/uL (ref 4.22–5.81)
RDW: 11.5 % (ref 11.5–15.5)
WBC: 9.9 10*3/uL (ref 4.0–10.5)
nRBC: 0 % (ref 0.0–0.2)

## 2023-06-04 LAB — SARS CORONAVIRUS 2 BY RT PCR: SARS Coronavirus 2 by RT PCR: POSITIVE — AB

## 2023-06-04 MED ORDER — LACTATED RINGERS IV SOLN: INTRAVENOUS | Status: AC

## 2023-06-04 MED ORDER — DM-GUAIFENESIN ER 30-600 MG PO TB12
1.0000 | ORAL_TABLET | Freq: Two times a day (BID) | ORAL | Status: DC
  Administered 2023-06-04 – 2023-06-08 (×9): 1 via ORAL
  Filled 2023-06-04 (×9): qty 1

## 2023-06-04 MED ORDER — ZINC SULFATE 220 (50 ZN) MG PO CAPS
220.0000 mg | ORAL_CAPSULE | Freq: Every day | ORAL | Status: DC
  Administered 2023-06-04 – 2023-06-08 (×5): 220 mg via ORAL
  Filled 2023-06-04 (×5): qty 1

## 2023-06-04 MED ORDER — VITAMIN C 500 MG PO TABS
500.0000 mg | ORAL_TABLET | Freq: Every day | ORAL | Status: DC
  Administered 2023-06-04 – 2023-06-08 (×5): 500 mg via ORAL
  Filled 2023-06-04 (×5): qty 1

## 2023-06-04 MED ORDER — IPRATROPIUM-ALBUTEROL 0.5-2.5 (3) MG/3ML IN SOLN
3.0000 mL | Freq: Four times a day (QID) | RESPIRATORY_TRACT | Status: DC
  Administered 2023-06-04: 3 mL via RESPIRATORY_TRACT
  Filled 2023-06-04 (×2): qty 3

## 2023-06-04 MED ORDER — PREDNISONE 20 MG PO TABS
50.0000 mg | ORAL_TABLET | Freq: Every day | ORAL | Status: DC
  Administered 2023-06-04 – 2023-06-07 (×4): 50 mg via ORAL
  Filled 2023-06-04 (×4): qty 1

## 2023-06-04 MED ORDER — IPRATROPIUM-ALBUTEROL 20-100 MCG/ACT IN AERS
1.0000 | INHALATION_SPRAY | Freq: Four times a day (QID) | RESPIRATORY_TRACT | Status: DC
  Administered 2023-06-04 – 2023-06-08 (×14): 1 via RESPIRATORY_TRACT
  Filled 2023-06-04: qty 4

## 2023-06-04 MED ORDER — POTASSIUM CHLORIDE CRYS ER 20 MEQ PO TBCR
40.0000 meq | EXTENDED_RELEASE_TABLET | Freq: Once | ORAL | Status: AC
  Administered 2023-06-04: 40 meq via ORAL
  Filled 2023-06-04: qty 2

## 2023-06-04 MED ORDER — ACETAMINOPHEN 325 MG PO TABS
650.0000 mg | ORAL_TABLET | Freq: Four times a day (QID) | ORAL | Status: DC | PRN
  Administered 2023-06-04 – 2023-06-07 (×3): 650 mg via ORAL
  Filled 2023-06-04 (×3): qty 2

## 2023-06-04 MED ORDER — FLUTICASONE PROPIONATE 50 MCG/ACT NA SUSP
1.0000 | Freq: Every day | NASAL | Status: DC
  Administered 2023-06-04 – 2023-06-08 (×5): 1 via NASAL
  Filled 2023-06-04: qty 16

## 2023-06-04 MED ORDER — POLYETHYLENE GLYCOL 3350 17 G PO PACK
17.0000 g | PACK | Freq: Every day | ORAL | Status: DC
  Administered 2023-06-04: 17 g via ORAL
  Filled 2023-06-04 (×4): qty 1

## 2023-06-04 MED ORDER — SALINE SPRAY 0.65 % NA SOLN: 1.0000 | NASAL | Status: DC | PRN

## 2023-06-04 NOTE — Progress Notes (Signed)
MD notified: The patient is positive for covid can you change the breathing treatment to inhaler pleaser per RT recommendation.

## 2023-06-04 NOTE — Assessment & Plan Note (Signed)
Patient started upper respiratory symptoms and became febrile earlier today.  Complaining of cough and shortness of breath, along with significant generalized body aches and malaise, seems like a viral illness. CBC today was without any leukocytosis and at baseline. COVID testing on 7/28 was negative. -Ordered chest x-ray, respiratory viral panel and repeat COVID testing. -Supportive care and symptom management. -Ordered LR for 20 hours due to decreased appetite and poor p.o. intake

## 2023-06-04 NOTE — Plan of Care (Signed)
  Problem: Health Behavior/Discharge Planning: Goal: Ability to manage health-related needs will improve Outcome: Progressing   Problem: Coping: Goal: Level of anxiety will decrease Outcome: Progressing   Problem: Nutrition: Goal: Adequate nutrition will be maintained Outcome: Progressing   Problem: Safety: Goal: Ability to remain free from injury will improve Outcome: Progressing   Problem: Skin Integrity: Goal: Risk for impaired skin integrity will decrease Outcome: Progressing

## 2023-06-04 NOTE — Plan of Care (Signed)
The patient is currently on airborne contact precautions. The patient is assisted to the bathroom. Positive covid result. Tylenol give for pain and fevers. Call bell at reach.  Problem: Education: Goal: Knowledge of General Education information will improve Description: Including pain rating scale, medication(s)/side effects and non-pharmacologic comfort measures Outcome: Progressing   Problem: Health Behavior/Discharge Planning: Goal: Ability to manage health-related needs will improve Outcome: Progressing   Problem: Clinical Measurements: Goal: Ability to maintain clinical measurements within normal limits will improve Outcome: Progressing Goal: Will remain free from infection Outcome: Progressing Goal: Diagnostic test results will improve Outcome: Progressing Goal: Respiratory complications will improve Outcome: Progressing Goal: Cardiovascular complication will be avoided Outcome: Progressing   Problem: Activity: Goal: Risk for activity intolerance will decrease Outcome: Progressing   Problem: Nutrition: Goal: Adequate nutrition will be maintained Outcome: Progressing   Problem: Coping: Goal: Level of anxiety will decrease Outcome: Progressing   Problem: Elimination: Goal: Will not experience complications related to bowel motility Outcome: Progressing Goal: Will not experience complications related to urinary retention Outcome: Progressing   Problem: Pain Managment: Goal: General experience of comfort will improve Outcome: Progressing   Problem: Safety: Goal: Ability to remain free from injury will improve Outcome: Progressing   Problem: Skin Integrity: Goal: Risk for impaired skin integrity will decrease Outcome: Progressing

## 2023-06-04 NOTE — Progress Notes (Signed)
Hospitalist has been notified: FYI Dr. This patient had d/c order for yesterday on 06/03/23. It was reported he had transportation issues and did not leave. He has had a low grade fever over the night and a temperature of 101.1 this morning at around 5 am. This RN has checked his temperature again since he is sweating and he keeps sneezing non stop. his temperature currently is 99.9. He has also had a cough over the night but he was only given a one time order of Mucinex. Do you recommend ordering tylenol or ibuprofen for this patient or any other lab work.

## 2023-06-04 NOTE — Progress Notes (Addendum)
Progress Note   Patient: Douglas Peterson. WUJ:[id number] DOB: 12/11/1950 DOA: 06/02/2023     1 DOS: the patient was seen and examined on 06/04/2023   Brief hospital course: Taken from H&P.  Douglas Peterson. is a 71 y.o. male with medical history significant of chronic alcohol abuse, with several related admissions came with diarrhea and reflux-like symptoms for the past few days. No nausea or vomiting and he has been able to tolerate diet.  He continues to drink beer and water.  Having worsening generalized weakness.  Found to have significant hyponatremia with sodium of 118 and was started on hypertonic saline.  7/31: Vital stable with mildly elevated blood pressure.  Sodium improved to 130 so hypertonic saline was discontinued.  His baseline seems to be around 128- 130, likely secondary to alcohol abuse and excessive water intake as hyponatremia labs with hypoosmolar hyponatremia, with significantly decreased urine and serum osmolality.  Urine sodium of 36. No bowel movement since in the hospital so C. difficile testing was discontinued.  PT is recommending home health which was ordered.  Patient was counseled again for drinking excessive beer and free water.  8/1: Patient got discharged yesterday but unable to leave due to ride issues.  Developed fever earlier in the morning with associated malaise, cough and shortness of breath.  CBC seems stable, BMP with improving sodium at 132 and mild hypokalemia which is being repleted.  Patient had negative COVID on 7/28. respiratory viral panel, repeat COVID testing, and chest x-ray ordered.  Likely a viral illness, but will monitor with symptom management. Addendum.  Patient tested positive for COVID     Assessment and Plan: * Hyponatremia Resolved, s/p hypertonic saline.  Sodium is at baseline now. Hyponatremia with labs with hypoosmolar hyponatremia, patient was excessively drinking beer and water, recently came back from rehab where he was  not drinking beer so he was craving more. Counseling was provided. -Patient need a close follow-up with PCP on discharge  Upper respiratory infection Patient started upper respiratory symptoms and became febrile earlier today.  Complaining of cough and shortness of breath, along with significant generalized body aches and malaise, seems like a viral illness. CBC today was without any leukocytosis and at baseline. COVID testing on 7/28 was negative. -Ordered chest x-ray, respiratory viral panel and repeat COVID testing. -Supportive care and symptom management. -Ordered LR for 20 hours due to decreased appetite and poor p.o. intake  Enteritis There was some concern of enteritis as he had diarrhea recently.  No bowel movement while in the hospital.  C. difficile testing was ordered and later was discontinued. -Supportive care  ETOH abuse I counseled patient against high risk alcohol use.  At this time patient reports that he has previously not had alcohol withdrawal.  Review of record indicates that patient has had withdrawal in the past.   -Patient was placed on CIWA protocol  Polyuria [name] reporting frequent urination.  Urinalysis is benign.  Patient seems to have history of BPH in the past. -Outpatient urology evaluation      Subjective: Patient was feeling miserable with cough, upper respiratory congestion and generalized malaise.  No nausea or vomiting but appetite was poor and stating that he cannot taste anything well.  Symptoms started earlier in the morning today.  Physical Exam: Vitals:   06/04/23 0456 06/04/23 0730 06/04/23 0743 06/04/23 0941  BP: (!) 125/98  128/82   Pulse: 93  90 82  Resp: 18  18 20   Temp: [name])  101.1 F (38.4 C) 99.9 F (37.7 C) 98.8 F (37.1 C)   TempSrc: Oral Oral    SpO2: 93%  91% 92%  Weight:      Height:       General.  Frail and malnourished elderly man, in no acute distress. Pulmonary.  Lungs clear bilaterally, normal respiratory  effort. CV.  Regular rate and rhythm, no JVD, rub or murmur. Abdomen.  Soft, nontender, nondistended, BS positive. CNS.  Alert and oriented .  Right hemiparesis Extremities.  No edema, no cyanosis, pulses intact and symmetrical. Psychiatry.  Judgment and insight appears normal.   Data Reviewed: Prior data reviewed  Family Communication: Talked with daughter on phone  Disposition: Status is: Inpatient Remains inpatient appropriate because: Severity of illness  Planned Discharge Destination: Home with Home Health  DVT prophylaxis.  Lovenox Time spent: 45 minutes  This record has been created using Conservation officer, historic buildings. Errors have been sought and corrected,but may not always be located. Such creation errors do not reflect on the standard of care.   Author: Arnetha Courser, MD 06/04/2023 2:24 PM  For on call review [email or web address].

## 2023-06-05 ENCOUNTER — Other Ambulatory Visit: Payer: Self-pay

## 2023-06-05 DIAGNOSIS — E871 Hypo-osmolality and hyponatremia: Secondary | ICD-10-CM | POA: Diagnosis not present

## 2023-06-05 MED ORDER — PANTOPRAZOLE SODIUM 40 MG PO TBEC
40.0000 mg | DELAYED_RELEASE_TABLET | Freq: Every day | ORAL | Status: DC
  Administered 2023-06-05 – 2023-06-07 (×3): 40 mg via ORAL
  Filled 2023-06-05 (×3): qty 1

## 2023-06-05 NOTE — Progress Notes (Signed)
PHARMACIST - PHYSICIAN COMMUNICATION  CONCERNING: IV to Oral Route Change Policy  RECOMMENDATION: This patient is receiving pantoprazole by the intravenous route.  Based on criteria approved by the Pharmacy and Therapeutics Committee, the intravenous medication(s) is/are being converted to the equivalent oral dose form(s).  DESCRIPTION: These criteria include: The patient is eating (either orally or via tube) and/or has been taking other orally administered medications for a least 24 hours The patient has no evidence of active gastrointestinal bleeding or impaired GI absorption (gastrectomy, short bowel, patient on TNA or NPO).  If you have questions about this conversion, please contact the Pharmacy Department   Tressie Ellis, [facility] 06/05/2023 12:03 PM

## 2023-06-05 NOTE — Plan of Care (Signed)
  Problem: Education: Goal: Knowledge of General Education information will improve Description: Including pain rating scale, medication(s)/side effects and non-pharmacologic comfort measures Outcome: Progressing   Problem: Safety: Goal: Ability to remain free from injury will improve Outcome: Progressing   Problem: Health Behavior/Discharge Planning: Goal: Ability to manage health-related needs will improve Outcome: Progressing   Problem: Skin Integrity: Goal: Risk for impaired skin integrity will decrease Outcome: Progressing

## 2023-06-05 NOTE — Progress Notes (Signed)
Physical Therapy Treatment Patient Details Name: Douglas Peterson. MRN: [id number] DOB: 1950/11/23 Today's Date: 06/05/2023   History of Present Illness Pt. is a 72 y.o. male with medical history significant of chronic alcohol abuse, with several related admissions.  Patient was in his usual state of health till approximately 6 days ago when he reports a new onset of diarrhea and "reflux "like symptoms suggestive of burning sensation in epigastric area with sour brash.  Patient reports 3 episodes of watery brown movement every day.  Patient has had no vomiting, has been able to tolerate p.o. diet.  Has continued to drink beer/water. Now COVID +    PT Comments  Pt was sitting in recliner upon arrival. Has ambulated longer distance 3 separate times today. Pt has less coughing with overall improved mobility, transfers, and gait. Cognition seemed improved as well. Pt tolerated session well and is progressing towards all PT goals. Will continue to follow per current POC.    If plan is discharge home, recommend the following: Help with stairs or ramp for entrance;Direct supervision/assist for medications management;Assist for transportation;A little help with walking and/or transfers;A little help with bathing/dressing/bathroom     Equipment Recommendations  None recommended by PT       Precautions / Restrictions Precautions Precautions: Fall Restrictions Weight Bearing Restrictions: No     Mobility  Bed Mobility  General bed mobility comments: Pt was in recliner pre/post session    Transfers Overall transfer level: Needs assistance Equipment used: Straight cane Transfers: Sit to/from Stand Sit to Stand: Supervision  General transfer comment: Pt stood with less assistance today. Also less coughing noted    Ambulation/Gait Ambulation/Gait assistance: Supervision Gait Distance (Feet): 200 Feet Assistive device: Straight cane Gait Pattern/deviations: Decreased stance time - right,  Decreased step length - left, Step-to pattern, Shuffle, Decreased stance time - left, Decreased step length - right, Decreased stride length Gait velocity: decreased  General Gait Details: Pt demonstrated improved gait from previous date. Less unsteadiness noted.    Balance Overall balance assessment: Needs assistance Sitting-balance support: Feet supported, Bilateral upper extremity supported Sitting balance-Leahy Scale: Good     Standing balance support: During functional activity, Single extremity supported, Reliant on assistive device for balance Standing balance-Leahy Scale: Fair       Cognition Arousal/Alertness: Awake/alert Behavior During Therapy: WFL for tasks assessed/performed Overall Cognitive Status: Within Functional Limits for tasks assessed    General Comments: Pt is A and O x 4               Pertinent Vitals/Pain Pain Assessment Pain Assessment: No/denies pain    Home Living Family/patient expects to be discharged to:: Private residence Living Arrangements: Alone Available Help at Discharge: Neighbor;Available PRN/intermittently Type of Home: House Home Access: Stairs to enter Entrance Stairs-Rails: Can reach both Entrance Stairs-Number of Steps: 2   Home Layout: Two level Home Equipment: Cane - single point Additional Comments: Sister moved to [place] so not around to help much anymore        PT Goals (current goals can now be found in the care plan section) Acute Rehab PT Goals Patient Stated Goal: to go home once stronger Progress towards PT goals: Progressing toward goals    Frequency    Min 1X/week      PT Plan Current plan remains appropriate       AM-PAC PT "6 Clicks" Mobility   Outcome Measure  Help needed turning from your back to your side while in a flat bed  without using bedrails?: None Help needed moving from lying on your back to sitting on the side of a flat bed without using bedrails?: A Little Help needed moving to  and from a bed to a chair (including a wheelchair)?: A Little Help needed standing up from a chair using your arms (e.g., wheelchair or bedside chair)?: A Little Help needed to walk in hospital room?: A Little Help needed climbing 3-5 steps with a railing? : A Lot 6 Click Score: 18    End of Session   Activity Tolerance: Patient tolerated treatment well;Patient limited by fatigue Patient left: in chair;with call bell/phone within reach;with chair alarm set Nurse Communication: Mobility status PT Visit Diagnosis: Unsteadiness on feet (R26.81);Difficulty in walking, not elsewhere classified (R26.2);Muscle weakness (generalized) (M62.81)     Time: 6644-0347 PT Time Calculation (min) (ACUTE ONLY): 20 min  Charges:    $Gait Training: 8-22 mins PT General Charges $$ ACUTE PT VISIT: 1 Visit                    Jetta Lout PTA 06/05/23, 4:38 PM

## 2023-06-05 NOTE — Progress Notes (Addendum)
Physical Therapy Treatment Patient Details Name: Douglas Peterson. MRN: [id number] DOB: Oct 05, 1951 Today's Date: 06/05/2023   History of Present Illness Pt. is a 72 y.o. male with medical history significant of chronic alcohol abuse, with several related admissions.  Patient was in his usual state of health till approximately 6 days ago when he reports a new onset of diarrhea and "reflux "like symptoms suggestive of burning sensation in epigastric area with sour brash.  Patient reports 3 episodes of watery brown movement every day.  Patient has had no vomiting, has been able to tolerate p.o. diet.  Has continued to drink beer/water.    PT Comments  Pt was long sitting in bed upon arrival. He is alert and oriented x 3. Unaware of why he is on airborne precautions. " I've had COVID before and this isn't like that." Pt has active cough throughout session with several times need to stop conversation due to coughing spells. Pt is agreeable to session but required slightly more assistance for all mobility transfers, and gait. He tolerated ambulation in room with CGA-supervision. 2 occasions of unsteadiness but no intervention required. Pt will continue to benefit form skilled PT at DC to maximize his independence and safety with all ADLs.    If plan is discharge home, recommend the following: Help with stairs or ramp for entrance;Direct supervision/assist for medications management;Assist for transportation;A little help with walking and/or transfers;A little help with bathing/dressing/bathroom   Can travel by private vehicle     Yes  Equipment Recommendations  None recommended by PT       Precautions / Restrictions Precautions Precautions: Fall Restrictions Weight Bearing Restrictions: No     Mobility  Bed Mobility Overal bed mobility: Needs Assistance Bed Mobility: Supine to Sit, Sit to Supine  Supine to sit: Min guard, Min assist Sit to supine: Min guard, Min assist General bed mobility  comments: more assistance required to exit bed today    Transfers Overall transfer level: Needs assistance Equipment used: Straight cane Transfers: Sit to/from Stand Sit to Stand: Min guard   Ambulation/Gait Ambulation/Gait assistance: Min guard, Supervision Assistive device: Straight cane Gait Pattern/deviations: Decreased stance time - right, Decreased step length - left, Step-to pattern, Shuffle, Decreased stance time - left, Decreased step length - right, Decreased stride length Gait velocity: decreased  General Gait Details: pt does hve two occasions of slight LOB without intervention.    Balance Overall balance assessment: Needs assistance Sitting-balance support: Feet supported, Bilateral upper extremity supported Sitting balance-Leahy Scale: Good     Standing balance support: During functional activity, Single extremity supported, Reliant on assistive device for balance Standing balance-Leahy Scale: Fair       Cognition Arousal/Alertness: Awake/alert Behavior During Therapy: WFL for tasks assessed/performed Overall Cognitive Status: Within Functional Limits for tasks assessed      General Comments: slow soft speech but seems at baseline cognition. Well known by Thereasa Parkin from previous admissions          PT Goals (current goals can now be found in the care plan section) Acute Rehab PT Goals Patient Stated Goal: to go home once stronger    Frequency    Min 1X/week      PT Plan Current plan remains appropriate       AM-PAC PT "6 Clicks" Mobility   Outcome Measure  Help needed turning from your back to your side while in a flat bed without using bedrails?: None Help needed moving from lying on your back to sitting on  the side of a flat bed without using bedrails?: A Little Help needed moving to and from a bed to a chair (including a wheelchair)?: A Little Help needed standing up from a chair using your arms (e.g., wheelchair or bedside chair)?: A  Little Help needed to walk in hospital room?: A Little Help needed climbing 3-5 steps with a railing? : A Lot 6 Click Score: 18    End of Session   Activity Tolerance: Patient limited by fatigue Patient left: in bed;with call bell/phone within reach;with bed alarm set Nurse Communication: Mobility status PT Visit Diagnosis: Unsteadiness on feet (R26.81);Difficulty in walking, not elsewhere classified (R26.2);Muscle weakness (generalized) (M62.81)     Time:  -     Charges:                 Jetta Lout PTA 06/05/23, 7:13 AM

## 2023-06-05 NOTE — Evaluation (Signed)
Occupational Therapy Evaluation Patient Details Name: Douglas Peterson. MRN: [id number] DOB: 1951/01/03 Today's Date: 06/05/2023   History of Present Illness Pt. is a 72 y.o. male with medical history significant of chronic alcohol abuse, with several related admissions.  Patient was in his usual state of health till approximately 6 days ago when he reports a new onset of diarrhea and "reflux "like symptoms suggestive of burning sensation in epigastric area with sour brash.  Patient reports 3 episodes of watery brown movement every day.  Patient has had no vomiting, has been able to tolerate p.o. diet.  Has continued to drink beer/water. Now COVID +   Clinical Impression   Mr Klarich was seen for OT evaluation this date. Pt from home alone, well known to our services. Pt currently MOD I for bed mobility and don B shoes in sitting. SBA + SPC for functional mobility ~500 ft. Pt appears near baseline, will sign off. Upon hospital discharge, recommend no OT follow up.   Recommendations for follow up therapy are one component of a multi-disciplinary discharge planning process, led by the attending physician.  Recommendations may be updated based on patient status, additional functional criteria and insurance authorization.   Assistance Recommended at Discharge Set up Supervision/Assistance  Patient can return home with the following      Functional Status Assessment  Patient has had a recent decline in their functional status and demonstrates the ability to make significant improvements in function in a reasonable and predictable amount of time.  Equipment Recommendations  BSC/3in1    Recommendations for Other Services       Precautions / Restrictions Precautions Precautions: Fall Restrictions Weight Bearing Restrictions: No      Mobility Bed Mobility Overal bed mobility: Modified Independent                  Transfers Overall transfer level: Needs assistance Equipment used:  Straight cane Transfers: Sit to/from Stand Sit to Stand: Supervision                  Balance Overall balance assessment: Needs assistance Sitting-balance support: Feet supported, Bilateral upper extremity supported Sitting balance-Leahy Scale: Good     Standing balance support: During functional activity, Single extremity supported, Reliant on assistive device for balance Standing balance-Leahy Scale: Fair                             ADL either performed or assessed with clinical judgement   ADL Overall ADL's : Needs assistance/impaired                                       General ADL Comments: MOD I don B shoes in sitting. SBA + SPC for functional mobility ~500 ft.      Pertinent Vitals/Pain Pain Assessment Pain Assessment: No/denies pain     Hand Dominance Left   Extremity/Trunk Assessment Upper Extremity Assessment Upper Extremity Assessment: RUE deficits/detail RUE Deficits / Details: chronic RUE contracture s/p CVAs   Lower Extremity Assessment Lower Extremity Assessment: Generalized weakness       Communication Communication Communication: No difficulties   Cognition Arousal/Alertness: Awake/alert Behavior During Therapy: WFL for tasks assessed/performed Overall Cognitive Status: Within Functional Limits for tasks assessed  Home Living Family/patient expects to be discharged to:: Private residence Living Arrangements: Alone Available Help at Discharge: Neighbor;Available PRN/intermittently Type of Home: House Home Access: Stairs to enter [employer] of Steps: 2 Entrance Stairs-Rails: Can reach both Home Layout: Two level               Home Equipment: Cane - single point   Additional Comments: Sister moved to [place] so not around to help much anymore      Prior Functioning/Environment Prior Level of Function :  Independent/Modified Independent               ADLs Comments: reports he now orders groceries online        OT Problem List: Decreased activity tolerance         OT Goals(Current goals can be found in the care plan section) Acute Rehab OT Goals Patient Stated Goal: to go home OT Goal Formulation: With patient Time For Goal Achievement: 06/19/23 Potential to Achieve Goals: Good   AM-PAC OT "6 Clicks" Daily Activity     Outcome Measure Help from another person eating meals?: None Help from another person taking care of personal grooming?: None Help from another person toileting, which includes using toliet, bedpan, or urinal?: A Little Help from another person bathing (including washing, rinsing, drying)?: A Little Help from another person to put on and taking off regular upper body clothing?: None Help from another person to put on and taking off regular lower body clothing?: None 6 Click Score: 22   End of Session Nurse Communication: Mobility status  Activity Tolerance: Patient tolerated treatment well Patient left: in chair;with call bell/phone within reach  OT Visit Diagnosis: Unsteadiness on feet (R26.81)                Time: 6213-0865 OT Time Calculation (min): 19 min Charges:  OT General Charges $OT Visit: 1 Visit OT Evaluation $OT Eval Low Complexity: 1 Low OT Treatments $Self Care/Home Management : 8-22 mins Kathie Dike, M.S. OTR/L  06/05/23, 2:16 PM  ascom [phone]

## 2023-06-05 NOTE — Progress Notes (Addendum)
  Progress Note   Patient: Douglas Peterson. ZOX:[id number] DOB: [birth year] DOA: 06/02/2023     2 DOS: the patient was seen and examined on 06/05/2023   Brief hospital course: Taken from H&P.  Douglas Peterson. is a 72 y.o. male with medical history significant of chronic alcohol abuse, with several related admissions came with diarrhea and reflux-like symptoms for the past few days.Found to have worsening generalized weakness.Workup significant for hyponatremia with sodium of 118 and was started on hypertonic saline.  7/31: Vital stable with mildly elevated blood pressure.  Sodium improved to 130 so hypertonic saline was discontinued.  His baseline seems to be around 128- 130, likely secondary to alcohol abuse and excessive water intake as hyponatremia labs with hypoosmolar hyponatremia, with significantly decreased urine and serum osmolality.  Urine sodium of 36. No bowel movement since in the hospital so C. difficile testing was discontinued.   8/1: Patient got discharged 42m 07/30 but was unable to leave due to ride issues.  Developed fever on 08/01 with associated malaise, cough and shortness of breath.  Patient had negative COVID on 7/28. respiratory viral panel, repeat COVID testing, and chest x-ray ordered and came back positive.No indication for Decadron or remdesivir at this time  Assessment and Plan:  COVID positive:  Upper respiratory symptoms present COVID re-testing on 8/01was positive. Chest x-ray negative for acute infiltrate Supportive care and symptom management  Hyponatremia Resolved, s/p hypertonic saline.  Sodium is at baseline now. Hyponatremia with labs with hypoosmolar hyponatremia likely due to excessive driking of water.Counseling was provided..  Enteritis There was some concern of enteritis as he had diarrhea recently.  No bowel movement while in the hospital.  C. difficile testing was ordered and later was discontinued. Supportive care  ETOH abuse: No evidence  of withdrawal symptoms On CIWA protocol  Polyuria [name] reporting frequent urination.  Urinalysis is benign.  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 No events reported overnight.Patient reported to have functional decline of baseline  Physical Exam: Vitals:   06/04/23 1948 06/05/23 0319 06/05/23 0828 06/05/23 1519  BP: 131/74 138/71 (!) 153/76 117/71  Pulse: 71 61 62 60  Resp: 18 18 18 18   Temp: 98.1 F (36.7 C) 97.8 F (36.6 C) 98 F (36.7 C) 98.3 F (36.8 C)  TempSrc: Oral Oral Oral Oral  SpO2: 93% 95% 97% 91%  Weight:      Height:       Patient looks well without any distress.Still remains on @l /min of oxygen HEENT:Head atraumatic. Neck supple. No JVD CHEST: Clinically diminished bilaterally. Abdomen: Soft , NT CVS: S1,S2 wnm or gallops CNS: chronic right upper extremity contractures due to chronic wekaness. Disuse atrophy + Skin: Negative for rash Data Reviewed: COVID positive  Family Communication: Will call family and update accordingly  Disposition: Status is: Inpatient Remains inpatient appropriate because: COVID  Planned Discharge Destination: Home    Time spent: 28 minutes  Author: Lilia Pro, MD 06/05/2023 3:35 PM  For on call review [email or web address].

## 2023-06-05 NOTE — Progress Notes (Signed)
Mobility Specialist - Progress Note   06/05/23 1600  Mobility  Activity Ambulated with assistance in hallway  Level of Assistance Standby assist, set-up cues, supervision of patient - no hands on  Assistive Device Cane  Distance Ambulated (ft) 640 ft  Activity Response Tolerated well  $Mobility charge 1 Mobility     Pt sitting in chair upon arrival, utilizing RA. Pt ambulated in hallway with minG. No LOB but staggering gait. Pt returned to room, mildly fatigued. Needs in reach.    [name] Mobility Specialist 06/05/23, 4:24 PM

## 2023-06-06 DIAGNOSIS — E871 Hypo-osmolality and hyponatremia: Secondary | ICD-10-CM | POA: Diagnosis not present

## 2023-06-06 NOTE — Progress Notes (Signed)
.   Progress Note   Patient: Douglas Peterson. WUJ:[id number] DOB: June 23, 1951 DOA: 06/02/2023     3 DOS: the patient was seen and examined on 06/06/2023   Brief hospital course: Taken from H&P.  Douglas Clear. is a 72 y.o. male with medical history significant of chronic alcohol abuse, with several related admissions came with diarrhea and reflux-like symptoms for the past few days.Found to have worsening generalized weakness.Workup significant for hyponatremia with sodium of 118 and was started on hypertonic saline.  7/31: Vital stable with mildly elevated blood pressure.  Sodium improved to 130 so hypertonic saline was discontinued.  His baseline seems to be around 128- 130, likely secondary to alcohol abuse and excessive water intake as hyponatremia labs with hypoosmolar hyponatremia, with significantly decreased urine and serum osmolality.  Urine sodium of 36. No bowel movement since in the hospital so C. difficile testing was discontinued.  8/1: Patient got discharged 59m 07/30 but was unable to leave due to ride issues.  Developed fever on 08/01 with associated malaise, cough and shortness of breath.  Patient had negative COVID on 7/28. respiratory viral panel, repeat COVID testing, and chest x-ray ordered and came back positive.No indication for Decadron or remdesivir at this time  8/3 : Plan was to discharge the patient however patient was still symptomatic with cough and dyspnea on minimal exertion.  Will reevaluate in the morning for possible discharge.   Assessment and Plan:  COVID positive:   Upper respiratory symptoms present COVID re-testing on 8/01was positive. Chest x-ray negative for acute infiltrate Supportive care and symptom management  Hyponatremia Resolved, s/p hypertonic saline.  Sodium is at baseline now. Hyponatremia with labs with hypoosmolar hyponatremia likely due to excessive driking of water.Counseling was provided..  Enteritis There was some concern of  enteritis as he had diarrhea recently.  No bowel movement while in the hospital.  C. difficile testing was ordered and later was discontinued. Supportive care  ETOH abuse: No evidence of withdrawal symptoms On CIWA protocol  Polyuria [name] reporting frequent urination.  Urinalysis is benign.  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  Patient reports feeling better however still dyspneic on minimal exertion.  Wants to stay another day  Physical Exam: Vitals:   06/05/23 1519 06/05/23 2008 06/06/23 0449 06/06/23 0722  BP: 117/71 132/73 (!) 151/68 (!) 162/88  Pulse: 60 60 (!) 56 64  Resp: 18 16 20 20   Temp: 98.3 F (36.8 C) 98.1 F (36.7 C) 97.6 F (36.4 C) 97.7 F (36.5 C)  TempSrc: Oral Oral Oral Oral  SpO2: 91% 95% 95% 96%  Weight:      Height:       Patient looks well without any distress.Still remains on @l /min of oxygen HEENT:Head atraumatic. Neck supple. No JVD CHEST: Clinically diminished bilaterally. Abdomen: Soft , NT CVS: S1,S2 wnm or gallops CNS: chronic right upper extremity contractures due to chronic wekaness. Disuse atrophy + Skin: Negative for rash Data Reviewed: COVID positive  Family Communication: Will call family and update accordingly  Disposition: Status is: Inpatient Remains inpatient appropriate because: COVID  Planned Discharge Destination: Home    Time spent: 28 minutes  Author: Kirstie Peri, MD 06/06/2023 11:42 AM  For on call review [email or web address].

## 2023-06-06 NOTE — Progress Notes (Signed)
Physical Therapy Treatment Patient Details Name: Douglas Peterson. MRN: [id number] DOB: 05/09/1951 Today's Date: 06/06/2023   History of Present Illness Pt. is a 72 y.o. male with medical history significant of chronic alcohol abuse, with several related admissions.  Patient was in his usual state of health till approximately 6 days ago when he reports a new onset of diarrhea and "reflux "like symptoms suggestive of burning sensation in epigastric area with sour brash.  Patient reports 3 episodes of watery brown movement every day.  Patient has had no vomiting, has been able to tolerate p.o. diet.  Has continued to drink beer/water. Now COVID +    PT Comments  Pt is well know by author from previous admissions. He was A and O and agreeable to session. Pt was able to stand and ambulate with overall less assistance and vcs. He ambulated with best gait quality ever observed by this author. Easily and safely able to ambulate 2 laps in hallway without O2 or unsteadiness. Does continue to use SPC. Acute PT will continue to follow and progress per current POC.    If plan is discharge home, recommend the following: Help with stairs or ramp for entrance;Direct supervision/assist for medications management;Assist for transportation;A little help with walking and/or transfers;A little help with bathing/dressing/bathroom     Equipment Recommendations  None recommended by PT       Precautions / Restrictions Precautions Precautions: Fall Restrictions Weight Bearing Restrictions: No     Mobility  Bed Mobility Overal bed mobility: Modified Independent   Transfers Overall transfer level: Needs assistance Equipment used: Straight cane Transfers: Sit to/from Stand Sit to Stand: Supervision   Ambulation/Gait Ambulation/Gait assistance: Supervision Gait Distance (Feet): 400 Feet Assistive device: Straight cane Gait Pattern/deviations: Step-through pattern Gait velocity: WNL  General Gait Details: Pt  ambulated with much improved gait speed and tolerance. No LOB noted today. Well known by Thereasa Parkin from previous admissions with today's ambulation better than any other time seen by this author    Balance Overall balance assessment: Modified Independent         Cognition Arousal/Alertness: Awake/alert Behavior During Therapy: WFL for tasks assessed/performed Overall Cognitive Status: Within Functional Limits for tasks assessed   General Comments: Pt is A and O x 4               Pertinent Vitals/Pain Pain Assessment Pain Assessment: No/denies pain     PT Goals (current goals can now be found in the care plan section) Acute Rehab PT Goals Patient Stated Goal: go home Progress towards PT goals: Progressing toward goals    Frequency    Min 1X/week      PT Plan Current plan remains appropriate       AM-PAC PT "6 Clicks" Mobility   Outcome Measure  Help needed turning from your back to your side while in a flat bed without using bedrails?: None Help needed moving from lying on your back to sitting on the side of a flat bed without using bedrails?: A Little Help needed moving to and from a bed to a chair (including a wheelchair)?: A Little Help needed standing up from a chair using your arms (e.g., wheelchair or bedside chair)?: A Little Help needed to walk in hospital room?: A Little Help needed climbing 3-5 steps with a railing? : A Little 6 Click Score: 19    End of Session   Activity Tolerance: Patient tolerated treatment well Patient left: in chair;with call bell/phone within reach;with chair alarm  set Nurse Communication: Mobility status PT Visit Diagnosis: Unsteadiness on feet (R26.81);Difficulty in walking, not elsewhere classified (R26.2);Muscle weakness (generalized) (M62.81)     Time: 2440-1027 PT Time Calculation (min) (ACUTE ONLY): 16 min  Charges:    $Gait Training: 8-22 mins PT General Charges $$ ACUTE PT VISIT: 1 Visit                     Jetta Lout PTA 06/06/23, 2:42 PM

## 2023-06-06 NOTE — Progress Notes (Signed)
MD notified: Douglas Peterson, the patient refuses bed alarm for fall risk. The patient has been educated yet continues to report he does not want the bed alarm.

## 2023-06-06 NOTE — Plan of Care (Signed)
The patient is stable. Has been mobile in the room using the urinal. BM on 06/06/23. On airborne contact precaution. Call bell at reach.  Problem: Education: Goal: Knowledge of General Education information will improve Description: Including pain rating scale, medication(s)/side effects and non-pharmacologic comfort measures Outcome: Progressing   Problem: Health Behavior/Discharge Planning: Goal: Ability to manage health-related needs will improve Outcome: Progressing   Problem: Clinical Measurements: Goal: Ability to maintain clinical measurements within normal limits will improve Outcome: Progressing Goal: Will remain free from infection Outcome: Progressing Goal: Diagnostic test results will improve Outcome: Progressing Goal: Respiratory complications will improve Outcome: Progressing Goal: Cardiovascular complication will be avoided Outcome: Progressing   Problem: Activity: Goal: Risk for activity intolerance will decrease Outcome: Progressing   Problem: Nutrition: Goal: Adequate nutrition will be maintained Outcome: Progressing   Problem: Coping: Goal: Level of anxiety will decrease Outcome: Progressing   Problem: Elimination: Goal: Will not experience complications related to bowel motility Outcome: Progressing Goal: Will not experience complications related to urinary retention Outcome: Progressing

## 2023-06-06 NOTE — Progress Notes (Signed)
Pt refuses to have bed alarm on. States he gets around just fine

## 2023-06-07 DIAGNOSIS — F1010 Alcohol abuse, uncomplicated: Secondary | ICD-10-CM | POA: Diagnosis not present

## 2023-06-07 DIAGNOSIS — J069 Acute upper respiratory infection, unspecified: Secondary | ICD-10-CM | POA: Diagnosis not present

## 2023-06-07 DIAGNOSIS — R3589 Other polyuria: Secondary | ICD-10-CM | POA: Diagnosis not present

## 2023-06-07 DIAGNOSIS — K529 Noninfective gastroenteritis and colitis, unspecified: Secondary | ICD-10-CM

## 2023-06-07 DIAGNOSIS — E871 Hypo-osmolality and hyponatremia: Secondary | ICD-10-CM | POA: Diagnosis not present

## 2023-06-07 MED ORDER — PREDNISONE 20 MG PO TABS
40.0000 mg | ORAL_TABLET | Freq: Every day | ORAL | Status: DC
  Administered 2023-06-08: 40 mg via ORAL
  Filled 2023-06-07: qty 2

## 2023-06-07 NOTE — TOC Progression Note (Signed)
Transition of Care (TOC) - Progression Note    Patient Details  Name: Douglas Peterson. MRN: [id number] Date of Birth: October 04, 1951  [facility]) CM/SW Contact  Susa Simmonds, [place] Phone Number: 06/07/2023, 2:34 PM  Clinical Narrative:  CSW spoke with patient who states if he is discharged he will be going to his home. Patient stated that he was previously staying at a senior living center but he was paying over 7,000 a month and it became too expensive. Csw told patient he might qualify for medicaid now. Patient stated he will see. Patient also stated he gets a monthly pension. CSW asked patient if he was interested in a home aide. Patient told CSW he is not at that point yet. Patient denies any help from family or friends. [name] active with [facility] for home health      Barriers to Discharge: Barriers Resolved  Expected Discharge Plan and Services     Post Acute Care Choice: Home Health, Skilled Nursing Facility Living arrangements for the past 2 months: Skilled Nursing Facility, Single Family Home Expected Discharge Date: 06/03/23                         HH Arranged: PT, OT HH Agency: [name] Date [facility] Agency Contacted: 06/03/23 Time HH Agency Contacted: [phone] Representative spoke with at [facility] Agency: [name] with Iantha Fallen [facility]   Social Determinants of Health (SDOH) Interventions SDOH Screenings   Food Insecurity: No Food Insecurity (06/03/2023)  Housing: Low Risk  (06/03/2023)  Transportation Needs: No Transportation Needs (06/03/2023)  Utilities: Not At Risk (06/03/2023)  Financial Resource Strain: Unknown (06/13/2019)  Physical Activity: Unknown (06/13/2019)  Social Connections: Unknown (06/13/2019)  Stress: Unknown (06/13/2019)  Tobacco Use: Medium Risk (06/02/2023)    Readmission Risk Interventions    04/29/2022   10:26 AM 03/14/2022   12:48 PM  Readmission Risk Prevention Plan  Transportation Screening Complete Complete  PCP or Specialist Appt  within 3-5 Days  Complete  HRI or Home Care Consult Complete Complete  Social Work Consult for Recovery Care Planning/Counseling Complete Complete  Palliative Care Screening Not Applicable Not Applicable  Medication Review Oceanographer) Complete Complete

## 2023-06-07 NOTE — Plan of Care (Signed)
The patient has been ambulating in the room and walked with mobility specialist this morning. The patient refuses bed alarm and SCD's as he has been moving constantly in the room. The patient reports feeling better. On airborne and enteric precautions at this time.  Problem: Education: Goal: Knowledge of General Education information will improve Description: Including pain rating scale, medication(s)/side effects and non-pharmacologic comfort measures Outcome: Progressing   Problem: Health Behavior/Discharge Planning: Goal: Ability to manage health-related needs will improve Outcome: Progressing   Problem: Clinical Measurements: Goal: Ability to maintain clinical measurements within normal limits will improve Outcome: Progressing Goal: Will remain free from infection Outcome: Progressing Goal: Diagnostic test results will improve Outcome: Progressing Goal: Respiratory complications will improve Outcome: Progressing Goal: Cardiovascular complication will be avoided Outcome: Progressing   Problem: Activity: Goal: Risk for activity intolerance will decrease Outcome: Progressing   Problem: Nutrition: Goal: Adequate nutrition will be maintained Outcome: Progressing   Problem: Coping: Goal: Level of anxiety will decrease Outcome: Progressing   Problem: Elimination: Goal: Will not experience complications related to bowel motility Outcome: Progressing Goal: Will not experience complications related to urinary retention Outcome: Progressing   Problem: Pain Managment: Goal: General experience of comfort will improve Outcome: Progressing   Problem: Skin Integrity: Goal: Risk for impaired skin integrity will decrease Outcome: Progressing

## 2023-06-07 NOTE — Progress Notes (Signed)
.   Progress Note   Patient: Douglas Peterson. ZOX:[id number] DOB: 1950/12/26 DOA: 06/02/2023     4 DOS: the patient was seen and examined on 06/07/2023   Brief hospital course: Taken from H&P.  Douglas Ore. is a 72 y.o. male with medical history significant of chronic alcohol abuse, with several related admissions came with diarrhea and reflux-like symptoms for the past few days.Found to have worsening generalized weakness.Workup significant for hyponatremia with sodium of 118 and was started on hypertonic saline.  7/31: Vital stable with mildly elevated blood pressure.  Sodium improved to 130 so hypertonic saline was discontinued.  His baseline seems to be around 128- 130, likely secondary to alcohol abuse and excessive water intake as hyponatremia labs with hypoosmolar hyponatremia, with significantly decreased urine and serum osmolality.  Urine sodium of 36. No bowel movement since in the hospital so C. difficile testing was discontinued.  8/1: Patient got discharged 82m 07/30 but was unable to leave due to ride issues.  Developed fever on 08/01 with associated malaise, cough and shortness of breath.  Patient had negative COVID on 7/28. respiratory viral panel, repeat COVID testing, and chest x-ray ordered and came back positive.No indication for Decadron or remdesivir at this time  8/3 : Plan was to discharge the patient however patient was still symptomatic with cough and dyspnea on minimal exertion.  Will reevaluate in the morning for possible discharge. 8/4: Patient states he will not go today.  Palliative care consult  Assessment and Plan:  COVID positive:   Upper respiratory symptoms present COVID re-testing on 8/01was positive. Chest x-ray negative for acute infiltrate Supportive care and symptom management  Hyponatremia Resolved, s/p hypertonic saline.  Sodium is at baseline now. Hyponatremia with labs with hypoosmolar hyponatremia likely due to excessive driking of  water.Counseling was provided..  Enteritis There was some concern of enteritis as he had diarrhea recently.  No bowel movement while in the hospital.  C. difficile testing was ordered and later was discontinued. Supportive care  ETOH abuse: No evidence of withdrawal symptoms On CIWA protocol  Polyuria [name] reporting frequent urination.  Urinalysis is benign.  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  Feeling weak, blood pressure running high  Physical Exam: Vitals:   06/06/23 1800 06/06/23 2023 06/07/23 0438 06/07/23 0806  BP: 137/75 131/76 (!) 161/77 (!) 165/82  Pulse: 61 60 (!) 56 67  Resp: 16 16 16 18   Temp: 98 F (36.7 C) 97.8 F (36.6 C) 97.6 F (36.4 C) (!) 97.5 F (36.4 C)  TempSrc:  Oral Oral Oral  SpO2: 95% 94% 95% 95%  Weight:      Height:       Patient looks well without any distress.Still remains on @l /min of oxygen HEENT:Head atraumatic. Neck supple. No JVD CHEST: Clinically diminished bilaterally. Abdomen: Soft , NT CVS: S1,S2 wnm or gallops CNS: chronic right upper extremity contractures due to chronic wekaness. Disuse atrophy + Skin: Negative for rash Data Reviewed: COVID positive  Family Communication: Tried calling family but no luck  Disposition: Status is: Inpatient Remains inpatient appropriate because: COVID  Planned Discharge Destination: Home with home health    Time spent: 25 minutes  Author: Delfino Lovett, MD 06/07/2023 3:26 PM  For on call review [email or web address].

## 2023-06-07 NOTE — Discharge Instructions (Signed)
Private Pay Resources  [facility] Address: [address], [place], [place] [address] Phone: [phone]  [facility] Address: [address], [place], [place] [address] Phone: [phone]  [facility] Address: [address], [place], [place] [address] Phone: [phone]  [facility] Address: [address], , [place] [address] Phone: [phone]  [facility] Address: [address], [place], [place] [address] Phone: [phone]  [facility] Phone: [phone]  [facility] Address:  [address], [place], [place] [address] Phone:  [phone]  [facility] Address:  [address] Phone:  [phone]  [email or web address]  [facility] Phone: [phone]   

## 2023-06-07 NOTE — Progress Notes (Signed)
Mobility Specialist - Progress Note   06/07/23 1025  Mobility  Activity Ambulated independently in hallway  Level of Assistance Independent  Assistive Device [place]  Distance Ambulated (ft) 2400 ft  Activity Response Tolerated well  Mobility Referral Yes  $Mobility charge 1 Mobility  Mobility Specialist Start Time (ACUTE ONLY) [id number]  Mobility Specialist Stop Time (ACUTE ONLY) 1008  Mobility Specialist Time Calculation (min) (ACUTE ONLY) 32 min   Pt OOB on RA upon arrival. Pt ambulates 15 laps indep with no LOB noted. Pt takes 2 short standing rest breaks during ambulation. Pt returns to chair with needs in reach.   Terrilyn Saver  Mobility Specialist  06/07/23 10:29 AM

## 2023-06-07 NOTE — Plan of Care (Signed)
  Problem: Safety: Goal: Ability to remain free from injury will improve Outcome: Progressing   Problem: Pain Managment: Goal: General experience of comfort will improve Outcome: Progressing   

## 2023-06-07 NOTE — Progress Notes (Signed)
Patient refused bed alarm.Patient educated on the importance of falls precaution.

## 2023-06-08 ENCOUNTER — Other Ambulatory Visit: Payer: Self-pay

## 2023-06-08 DIAGNOSIS — R197 Diarrhea, unspecified: Secondary | ICD-10-CM

## 2023-06-08 DIAGNOSIS — R350 Frequency of micturition: Secondary | ICD-10-CM

## 2023-06-08 DIAGNOSIS — Z789 Other specified health status: Secondary | ICD-10-CM | POA: Diagnosis not present

## 2023-06-08 DIAGNOSIS — E871 Hypo-osmolality and hyponatremia: Secondary | ICD-10-CM | POA: Diagnosis not present

## 2023-06-08 MED ORDER — TAMSULOSIN HCL 0.4 MG PO CAPS
0.4000 mg | ORAL_CAPSULE | Freq: Every day | ORAL | 0 refills | Status: DC
  Filled 2023-06-08: qty 30, 30d supply, fill #0

## 2023-06-08 NOTE — Care Management Important Message (Signed)
Important Message  Patient Details  Name: Douglas Peterson. MRN: [id number] Date of Birth: 1950/12/12   Medicare Important Message Given:  Yes  Reviewed Medicare IM with patient via room phone [phone]).   [name] 06/08/2023, 11:49 AM

## 2023-06-08 NOTE — Progress Notes (Signed)
Discharge instructions reviewed with patient including followup visits and medications.  Understanding was verbalized and all questions were answered.  IV removed without complication; patient tolerated well.  Awaiting one additional prescription from [facility].

## 2023-06-08 NOTE — TOC Transition Note (Signed)
Transition of Care [facility]) - CM/SW Discharge Note   Patient Details  Name: Douglas Peterson. MRN: [id number] Date of Birth: Aug 14, 1951  Transition of Care [facility]) CM/SW Contact:  Margarito Liner, LCSW Phone Number: 06/08/2023, 11:15 AM   Clinical Narrative:  Patient has orders to discharge home today. [facility] liaison is aware. They are coordinating new PCP appointment with Dr. Sherrin Daisy at [facility]. Patient will pay for his own cab home. RN is aware. No further concerns. CSW signing off.   Final next level of care: Home w Home Health Services Barriers to Discharge: Barriers Resolved   Patient Goals and CMS Choice CMS Medicare.gov Compare Post Acute Care list provided to:: Patient Choice offered to / list presented to : Patient  Discharge Placement                  Patient to be transferred to facility by: Cab   Patient and family notified of of transfer: 06/08/23  Discharge Plan and Services Additional resources added to the After Visit Summary for       Post Acute Care Choice: Home Health, Skilled Nursing Facility                    HH Arranged: PT, OT [facility] Agency: [name] Date [facility] Agency Contacted: 06/08/23 Time HH Agency Contacted: [phone] Representative spoke with at [facility] Agency: Chucky May  Social Determinants of Health (SDOH) Interventions SDOH Screenings   Food Insecurity: No Food Insecurity (06/03/2023)  Housing: Low Risk  (06/03/2023)  Transportation Needs: No Transportation Needs (06/03/2023)  Utilities: Not At Risk (06/03/2023)  Financial Resource Strain: Unknown (06/13/2019)  Physical Activity: Unknown (06/13/2019)  Social Connections: Unknown (06/13/2019)  Stress: Unknown (06/13/2019)  Tobacco Use: Medium Risk (06/02/2023)     Readmission Risk Interventions    04/29/2022   10:26 AM 03/14/2022   12:48 PM  Readmission Risk Prevention Plan  Transportation Screening Complete Complete  PCP or Specialist Appt within 3-5 Days   Complete  HRI or Home Care Consult Complete Complete  Social Work Consult for Recovery Care Planning/Counseling Complete Complete  Palliative Care Screening Not Applicable Not Applicable  Medication Review Oceanographer) Complete Complete

## 2023-06-08 NOTE — Progress Notes (Signed)
Mobility Specialist - Progress Note   06/08/23 0942  Mobility  Activity Ambulated independently in hallway;Stood at bedside;Dangled on edge of bed;Ambulated independently to bathroom  Level of Assistance Independent after set-up  Assistive Device [facility] Ambulated (ft) 2420 ft  Activity Response Tolerated well  Mobility Referral Yes  $Mobility charge 1 Mobility  Mobility Specialist Start Time (ACUTE ONLY) 0856  Mobility Specialist Stop Time (ACUTE ONLY) [id number]  Mobility Specialist Time Calculation (min) (ACUTE ONLY) 27 min   Pt sitting in recliner on RA upon arrival. Pt STS and ambulates to/from bathroom and in hallway MODI. Pt returns to recliner with needs in reach.   Terrilyn Saver  Mobility Specialist  06/08/23 9:44 AM

## 2023-06-08 NOTE — Plan of Care (Signed)
PMT consult noted for goals of care.  Notes reviewed and patient is discharging home today.  PMT will sign off at this time, please reconsult if needs arise.

## 2023-06-14 ENCOUNTER — Emergency Department
Admission: EM | Admit: 2023-06-14 | Discharge: 2023-06-14 | Disposition: A | Discharge status: Home / Self Care | Payer: Medicare HMO | Source: Home / Self Care | Attending: Emergency Medicine | Admitting: Emergency Medicine

## 2023-06-14 ENCOUNTER — Emergency Department: Payer: Medicare HMO

## 2023-06-14 DIAGNOSIS — R112 Nausea with vomiting, unspecified: Secondary | ICD-10-CM | POA: Insufficient documentation

## 2023-06-14 DIAGNOSIS — R531 Weakness: Secondary | ICD-10-CM | POA: Insufficient documentation

## 2023-06-14 DIAGNOSIS — R1084 Generalized abdominal pain: Secondary | ICD-10-CM | POA: Insufficient documentation

## 2023-06-14 DIAGNOSIS — Z8616 Personal history of COVID-19: Secondary | ICD-10-CM | POA: Insufficient documentation

## 2023-06-14 DIAGNOSIS — E872 Acidosis, unspecified: Secondary | ICD-10-CM | POA: Diagnosis not present

## 2023-06-14 DIAGNOSIS — E871 Hypo-osmolality and hyponatremia: Secondary | ICD-10-CM | POA: Insufficient documentation

## 2023-06-14 DIAGNOSIS — K2920 Alcoholic gastritis without bleeding: Secondary | ICD-10-CM | POA: Diagnosis not present

## 2023-06-14 DIAGNOSIS — R0602 Shortness of breath: Secondary | ICD-10-CM | POA: Insufficient documentation

## 2023-06-14 DIAGNOSIS — D72829 Elevated white blood cell count, unspecified: Secondary | ICD-10-CM | POA: Insufficient documentation

## 2023-06-14 LAB — COMPREHENSIVE METABOLIC PANEL
ALT: 62 U/L — ABNORMAL HIGH (ref 0–44)
AST: 57 U/L — ABNORMAL HIGH (ref 15–41)
Albumin: 3.7 g/dL (ref 3.5–5.0)
Alkaline Phosphatase: 78 U/L (ref 38–126)
Anion gap: 10 (ref 5–15)
BUN: 7 mg/dL — ABNORMAL LOW (ref 8–23)
CO2: 28 mmol/L (ref 22–32)
Calcium: 8.5 mg/dL — ABNORMAL LOW (ref 8.9–10.3)
Chloride: 93 mmol/L — ABNORMAL LOW (ref 98–111)
Creatinine, Ser: 0.57 mg/dL — ABNORMAL LOW (ref 0.61–1.24)
GFR, Estimated: >60 mL/min (ref >60)
Glucose, Bld: 105 mg/dL — ABNORMAL HIGH (ref 70–99)
Potassium: 4.5 mmol/L (ref 3.5–5.1)
Sodium: 131 mmol/L — ABNORMAL LOW (ref 135–145)
Total Bilirubin: 0.9 mg/dL (ref 0.3–1.2)
Total Protein: 7.6 g/dL (ref 6.5–8.1)

## 2023-06-14 LAB — CBC
HCT: 46.3 % (ref 39.0–52.0)
Hemoglobin: 15.8 g/dL (ref 13.0–17.0)
MCH: 31 pg (ref 26.0–34.0)
MCHC: 34.1 g/dL (ref 30.0–36.0)
MCV: 91 fL (ref 80.0–100.0)
Platelets: 274 10*3/uL (ref 150–400)
RBC: 5.09 MIL/uL (ref 4.22–5.81)
RDW: 11.5 % (ref 11.5–15.5)
WBC: 16.2 10*3/uL — ABNORMAL HIGH (ref 4.0–10.5)
nRBC: 0.4 % — ABNORMAL HIGH (ref 0.0–0.2)

## 2023-06-14 LAB — URINALYSIS, ROUTINE W REFLEX MICROSCOPIC
Bilirubin Urine: NEGATIVE
Glucose, UA: NEGATIVE mg/dL
Hgb urine dipstick: NEGATIVE
Ketones, ur: NEGATIVE mg/dL
Leukocytes,Ua: NEGATIVE
Nitrite: NEGATIVE
Protein, ur: NEGATIVE mg/dL
Specific Gravity, Urine: 1.002 — ABNORMAL LOW (ref 1.005–1.030)
pH: 7 (ref 5.0–8.0)

## 2023-06-14 LAB — TROPONIN I (HIGH SENSITIVITY)
Troponin I (High Sensitivity): 6 ng/L (ref <18)
Troponin I (High Sensitivity): 4 ng/L (ref <18)

## 2023-06-14 LAB — LIPASE, BLOOD: Lipase: 35 U/L (ref 11–51)

## 2023-06-14 MED ORDER — ONDANSETRON HCL 4 MG/2ML IJ SOLN
4.0000 mg | Freq: Once | INTRAMUSCULAR | Status: AC
  Administered 2023-06-14: 4 mg via INTRAVENOUS
  Filled 2023-06-14: qty 2

## 2023-06-14 MED ORDER — METOCLOPRAMIDE HCL 5 MG/ML IJ SOLN
10.0000 mg | Freq: Once | INTRAMUSCULAR | Status: AC
  Administered 2023-06-14: 10 mg via INTRAVENOUS
  Filled 2023-06-14: qty 2

## 2023-06-14 MED ORDER — IOHEXOL 300 MG/ML  SOLN
80.0000 mL | Freq: Once | INTRAMUSCULAR | Status: AC | PRN
  Administered 2023-06-14: 80 mL via INTRAVENOUS

## 2023-06-14 MED ORDER — METOCLOPRAMIDE HCL 10 MG PO TABS: 10.0000 mg | ORAL_TABLET | Freq: Three times a day (TID) | ORAL | 0 refills | Status: DC | PRN

## 2023-06-14 MED ORDER — SODIUM CHLORIDE 0.9 % IV BOLUS
1000.0000 mL | Freq: Once | INTRAVENOUS | Status: AC
  Administered 2023-06-14: 1000 mL via INTRAVENOUS

## 2023-06-14 NOTE — ED Notes (Signed)
Transport is arranged for the pt. spoke with [facility] rep. Richard he stated they have put the pt. on the list.

## 2023-06-14 NOTE — ED Provider Notes (Signed)
[facility] Provider Note    Event Date/Time   First MD Initiated Contact with Patient 06/14/23 1540     (approximate)   History   Abdominal Pain   HPI  Douglas Peterson. is a 72 y.o. male  with history of chronic alcohol abuse, admission in July for hyponatremia presenting to the emergency department for evaluation of weakness.  Patient reports that for the past several days he has had weakness with nausea and diarrhea as well as cough and shortness of breath.  10 days ago, he did have a positive COVID test, but reports he has had worsening weakness over the past 5 to 6 days with some nausea and diarrhea as well as generalized abdominal pain worse in his lower abdomen.  Thinks he may be having subjective fevers.  Further history difficult as patient keeps repeating "I am sick".  Does state that he has been only drinking beer, cannot tolerate anything else due to nausea.     Physical Exam   Triage Vital Signs: ED Triage Vitals  Encounter Vitals Group     BP 06/14/23 1525 (!) 148/89     Systolic BP Percentile --      Diastolic BP Percentile --      Pulse Rate 06/14/23 1525 97     Resp 06/14/23 1525 18     Temp 06/14/23 1525 97.9 F (36.6 C)     Temp Source 06/14/23 1525 Oral     SpO2 06/14/23 1525 94 %     Weight 06/14/23 1526 130 lb (59 kg)     Height 06/14/23 1526 5\' 3"  (1.6 m)     Head Circumference --      Peak Flow --      Pain Score 06/14/23 1526 5     Pain Loc --      Pain Education --      Exclude from Growth Chart --     Most recent vital signs: Vitals:   06/14/23 1930 06/14/23 2000  BP: (!) 187/94 (!) 174/88  Pulse: (!) 104 (!) 101  Resp: 19 16  Temp:    SpO2: 95% 97%     General: Awake, interactive  CV:  Regular rate, good peripheral perfusion.  Resp:  Lungs clear, unlabored respirations.  Abd:  Soft, nondistended, mild generalized tenderness to palpation  Neuro:  Symmetric facial movement, fluid speech   ED Results /  Procedures / Treatments   Labs (all labs ordered are listed, but only abnormal results are displayed) Labs Reviewed  COMPREHENSIVE METABOLIC PANEL - Abnormal; Notable for the following components:      Result Value   Sodium 131 (*)    Chloride 93 (*)    Glucose, Bld 105 (*)    BUN 7 (*)    Creatinine, Ser 0.57 (*)    Calcium 8.5 (*)    AST 57 (*)    ALT 62 (*)    All other components within normal limits  CBC - Abnormal; Notable for the following components:   WBC 16.2 (*)    nRBC 0.4 (*)    All other components within normal limits  URINALYSIS, ROUTINE W REFLEX MICROSCOPIC - Abnormal; Notable for the following components:   Color, Urine COLORLESS (*)    APPearance CLEAR (*)    Specific Gravity, Urine 1.002 (*)    All other components within normal limits  LIPASE, BLOOD  TROPONIN I (HIGH SENSITIVITY)  TROPONIN I (HIGH SENSITIVITY)  EKG EKG independently reviewed interpreted by myself (ER attending) demonstrates:   EKG demonstrate sinus rhythm at a rate of 80, PR 189, QRS 95, QTc 434, no acute ST changes  RADIOLOGY Imaging independently reviewed and interpreted by myself demonstrates:  Chest x- without focal consolidation CT abdomen pelvis without bowel obstruction.  Radiology notes to stable hernias without evidence of incarceration, not consistent with location of patient's pain  PROCEDURES:  Critical Care performed: No  Procedures   MEDICATIONS ORDERED IN ED: Medications  iohexol (OMNIPAQUE) 300 MG/ML solution 80 mL (80 mLs Intravenous Contrast Given 06/14/23 1635)  sodium chloride 0.9 % bolus 1,000 mL (0 mLs Intravenous Stopped 06/14/23 1936)  ondansetron (ZOFRAN) injection 4 mg (4 mg Intravenous Given 06/14/23 1826)  metoCLOPramide (REGLAN) injection 10 mg (10 mg Intravenous Given 06/14/23 1934)     IMPRESSION / MDM / ASSESSMENT AND PLAN / ED COURSE  I reviewed the triage vital signs and the nursing notes.  Differential diagnosis includes, but is not  limited to, sequela from recent COVID-19 infection, development of superimposed pneumonia, arrhythmia, anemia, electrolyte abnormality including recurrent hyponatremia, acute intra-abdominal process including appendicitis, colitis, diverticulitis, UTI  Patient's presentation is most consistent with acute presentation with potential threat to life or bodily function.  72 year old male presenting with weakness with multiple associated symptoms.  Vital signs stable.  Will obtain basic labs, chest x-, CT abdomen pelvis to further evaluate.  Given recent significant hyponatremia, will hold off on fluid resuscitation until sodium level has been obtained.  CBC does demonstrate new leukocytosis WBC 16, CMP with slightly worsened hyponatremia at 131, but much improved from recent admission.  CMP otherwise without severe derangement.  Normal lipase.  UA without evidence of infection.  Negative initial and repeat troponin.  Chest x- without evidence of pneumonia.  CT abdomen pelvis without acute abnormalities.  Patient was ordered for IV fluids and Zofran.  On reevaluation he did have some ongoing nausea, so he was given Reglan.  Following this he was p.o. challenged which he tolerated without issue.  Suspect that his symptoms could be related to sequela from his recent COVID-19 infection or other viral illness.  Given his ability to tolerate p.o. here, do think he is stable for discharge home.  Will DC with a prescription for Reglan.  Strict return precautions provided.  Patient discharged stable condition.      FINAL CLINICAL IMPRESSION(S) / ED DIAGNOSES   Final diagnoses:  Nausea and vomiting, unspecified vomiting type  Hyponatremia  Weakness  Shortness of breath     Rx / DC Orders   ED Discharge Orders          Ordered    metoCLOPramide (REGLAN) 10 MG tablet  Every 8 hours PRN        06/14/23 2008             Note:  This document was prepared using Dragon voice recognition software  and may include unintentional dictation errors.   Trinna Post, MD 06/15/23 0010

## 2023-06-14 NOTE — ED Notes (Signed)
Patient tolerated water and saltine crackers well.

## 2023-06-14 NOTE — ED Notes (Signed)
Several staff members have answered patients call light request this shift.

## 2023-06-14 NOTE — ED Notes (Signed)
First Nurse Note: Patient to ED via [facility] from home for abd pain and weakness. Pt states he was been only drinking liquids for the past few days- beer and liquor. Also having diarrhea.  EMS VS: 159/90 85 HR 95% RA 105 cbg

## 2023-06-14 NOTE — ED Triage Notes (Signed)
Pt came in via EMS from home for abd pain, SHOB, Cough, and weakness x 3 days. Pt co of diarrhea and nausea. Pt is A&Ox4.

## 2023-06-14 NOTE — Discharge Instructions (Addendum)
You were in the emergency room today for evaluation of your vomiting weakness.  Your testing was reassuring.  Please follow your primary care doctor for further evaluation.  I sent a prescription for nausea medicine to your pharmacy that you can take as needed.  Return to the ER for new or worsening symptoms.

## 2023-06-16 ENCOUNTER — Emergency Department: Payer: Medicare HMO

## 2023-06-16 ENCOUNTER — Other Ambulatory Visit: Payer: Self-pay

## 2023-06-16 ENCOUNTER — Encounter: Payer: Self-pay | Admitting: Emergency Medicine

## 2023-06-16 ENCOUNTER — Inpatient Hospital Stay
Admission: EM | Admit: 2023-06-16 | Discharge: 2023-06-30 | DRG: 392 | Disposition: A | Discharge status: Home / Self Care | Payer: Medicare HMO | Attending: Internal Medicine | Admitting: Internal Medicine

## 2023-06-16 DIAGNOSIS — F05 Delirium due to known physiological condition: Secondary | ICD-10-CM | POA: Diagnosis not present

## 2023-06-16 DIAGNOSIS — Z79899 Other long term (current) drug therapy: Secondary | ICD-10-CM

## 2023-06-16 DIAGNOSIS — Z86711 Personal history of pulmonary embolism: Secondary | ICD-10-CM

## 2023-06-16 DIAGNOSIS — M2450 Contracture, unspecified joint: Secondary | ICD-10-CM | POA: Diagnosis present

## 2023-06-16 DIAGNOSIS — Z9049 Acquired absence of other specified parts of digestive tract: Secondary | ICD-10-CM

## 2023-06-16 DIAGNOSIS — K219 Gastro-esophageal reflux disease without esophagitis: Secondary | ICD-10-CM | POA: Diagnosis present

## 2023-06-16 DIAGNOSIS — Z87820 Personal history of traumatic brain injury: Secondary | ICD-10-CM

## 2023-06-16 DIAGNOSIS — F1020 Alcohol dependence, uncomplicated: Secondary | ICD-10-CM | POA: Diagnosis present

## 2023-06-16 DIAGNOSIS — E872 Acidosis, unspecified: Secondary | ICD-10-CM | POA: Diagnosis present

## 2023-06-16 DIAGNOSIS — R1084 Generalized abdominal pain: Secondary | ICD-10-CM

## 2023-06-16 DIAGNOSIS — E871 Hypo-osmolality and hyponatremia: Secondary | ICD-10-CM | POA: Diagnosis present

## 2023-06-16 DIAGNOSIS — K4090 Unilateral inguinal hernia, without obstruction or gangrene, not specified as recurrent: Secondary | ICD-10-CM

## 2023-06-16 DIAGNOSIS — R5381 Other malaise: Secondary | ICD-10-CM | POA: Diagnosis present

## 2023-06-16 DIAGNOSIS — F10229 Alcohol dependence with intoxication, unspecified: Secondary | ICD-10-CM | POA: Diagnosis present

## 2023-06-16 DIAGNOSIS — Z91119 Patient's noncompliance with dietary regimen due to unspecified reason: Secondary | ICD-10-CM

## 2023-06-16 DIAGNOSIS — K2920 Alcoholic gastritis without bleeding: Secondary | ICD-10-CM | POA: Diagnosis not present

## 2023-06-16 DIAGNOSIS — R109 Unspecified abdominal pain: Secondary | ICD-10-CM | POA: Diagnosis not present

## 2023-06-16 DIAGNOSIS — M189 Osteoarthritis of first carpometacarpal joint, unspecified: Secondary | ICD-10-CM | POA: Diagnosis present

## 2023-06-16 DIAGNOSIS — E785 Hyperlipidemia, unspecified: Secondary | ICD-10-CM | POA: Diagnosis present

## 2023-06-16 DIAGNOSIS — R7989 Other specified abnormal findings of blood chemistry: Secondary | ICD-10-CM | POA: Diagnosis present

## 2023-06-16 DIAGNOSIS — K4030 Unilateral inguinal hernia, with obstruction, without gangrene, not specified as recurrent: Secondary | ICD-10-CM | POA: Diagnosis present

## 2023-06-16 DIAGNOSIS — I480 Paroxysmal atrial fibrillation: Secondary | ICD-10-CM | POA: Diagnosis present

## 2023-06-16 DIAGNOSIS — Z8616 Personal history of COVID-19: Secondary | ICD-10-CM

## 2023-06-16 DIAGNOSIS — I5032 Chronic diastolic (congestive) heart failure: Secondary | ICD-10-CM | POA: Diagnosis present

## 2023-06-16 DIAGNOSIS — R6510 Systemic inflammatory response syndrome (SIRS) of non-infectious origin without acute organ dysfunction: Secondary | ICD-10-CM | POA: Diagnosis present

## 2023-06-16 DIAGNOSIS — Z87891 Personal history of nicotine dependence: Secondary | ICD-10-CM

## 2023-06-16 DIAGNOSIS — I10 Essential (primary) hypertension: Secondary | ICD-10-CM | POA: Diagnosis present

## 2023-06-16 DIAGNOSIS — N400 Enlarged prostate without lower urinary tract symptoms: Secondary | ICD-10-CM | POA: Diagnosis present

## 2023-06-16 DIAGNOSIS — J209 Acute bronchitis, unspecified: Secondary | ICD-10-CM | POA: Diagnosis present

## 2023-06-16 DIAGNOSIS — Z8249 Family history of ischemic heart disease and other diseases of the circulatory system: Secondary | ICD-10-CM

## 2023-06-16 DIAGNOSIS — F1010 Alcohol abuse, uncomplicated: Principal | ICD-10-CM

## 2023-06-16 DIAGNOSIS — Z1152 Encounter for screening for COVID-19: Secondary | ICD-10-CM

## 2023-06-16 DIAGNOSIS — R110 Nausea: Secondary | ICD-10-CM

## 2023-06-16 DIAGNOSIS — F10239 Alcohol dependence with withdrawal, unspecified: Secondary | ICD-10-CM | POA: Diagnosis not present

## 2023-06-16 DIAGNOSIS — I110 Hypertensive heart disease with heart failure: Secondary | ICD-10-CM | POA: Diagnosis present

## 2023-06-16 DIAGNOSIS — Z888 Allergy status to other drugs, medicaments and biological substances status: Secondary | ICD-10-CM

## 2023-06-16 DIAGNOSIS — R1310 Dysphagia, unspecified: Secondary | ICD-10-CM | POA: Diagnosis present

## 2023-06-16 DIAGNOSIS — Y908 Blood alcohol level of 240 mg/100 ml or more: Secondary | ICD-10-CM | POA: Diagnosis present

## 2023-06-16 LAB — LACTIC ACID, PLASMA
Lactic Acid, Venous: 2 mmol/L (ref 0.5–1.9)
Lactic Acid, Venous: 2.6 mmol/L (ref 0.5–1.9)
Lactic Acid, Venous: 3.1 mmol/L (ref 0.5–1.9)

## 2023-06-16 LAB — CBC
HCT: 48.2 % (ref 39.0–52.0)
Hemoglobin: 16.8 g/dL (ref 13.0–17.0)
MCH: 31.4 pg (ref 26.0–34.0)
MCHC: 34.9 g/dL (ref 30.0–36.0)
MCV: 90.1 fL (ref 80.0–100.0)
Platelets: 254 10*3/uL (ref 150–400)
RBC: 5.35 MIL/uL (ref 4.22–5.81)
RDW: 11.9 % (ref 11.5–15.5)
WBC: 18.9 10*3/uL — ABNORMAL HIGH (ref 4.0–10.5)
nRBC: 0 % (ref 0.0–0.2)

## 2023-06-16 LAB — URINALYSIS, ROUTINE W REFLEX MICROSCOPIC
Bilirubin Urine: NEGATIVE
Glucose, UA: NEGATIVE mg/dL
Hgb urine dipstick: NEGATIVE
Ketones, ur: NEGATIVE mg/dL
Leukocytes,Ua: NEGATIVE
Nitrite: NEGATIVE
Protein, ur: NEGATIVE mg/dL
Specific Gravity, Urine: 1.016 (ref 1.005–1.030)
pH: 6 (ref 5.0–8.0)

## 2023-06-16 LAB — BASIC METABOLIC PANEL
Anion gap: 12 (ref 5–15)
BUN: 6 mg/dL — ABNORMAL LOW (ref 8–23)
CO2: 26 mmol/L (ref 22–32)
Calcium: 8.2 mg/dL — ABNORMAL LOW (ref 8.9–10.3)
Chloride: 96 mmol/L — ABNORMAL LOW (ref 98–111)
Creatinine, Ser: 0.57 mg/dL — ABNORMAL LOW (ref 0.61–1.24)
GFR, Estimated: >60 mL/min (ref >60)
Glucose, Bld: 107 mg/dL — ABNORMAL HIGH (ref 70–99)
Potassium: 3.8 mmol/L (ref 3.5–5.1)
Sodium: 134 mmol/L — ABNORMAL LOW (ref 135–145)

## 2023-06-16 LAB — RESP PANEL BY RT-PCR (RSV, FLU A&B, COVID)  RVPGX2
Influenza A by PCR: NEGATIVE
Influenza B by PCR: NEGATIVE
Resp Syncytial Virus by PCR: NEGATIVE
SARS Coronavirus 2 by RT PCR: NEGATIVE

## 2023-06-16 LAB — HEPATIC FUNCTION PANEL
ALT: 58 U/L — ABNORMAL HIGH (ref 0–44)
AST: 49 U/L — ABNORMAL HIGH (ref 15–41)
Albumin: 3.9 g/dL (ref 3.5–5.0)
Alkaline Phosphatase: 82 U/L (ref 38–126)
Bilirubin, Direct: 0.2 mg/dL (ref 0.0–0.2)
Indirect Bilirubin: 0.7 mg/dL (ref 0.3–0.9)
Total Bilirubin: 0.9 mg/dL (ref 0.3–1.2)
Total Protein: 7.4 g/dL (ref 6.5–8.1)

## 2023-06-16 LAB — BRAIN NATRIURETIC PEPTIDE: B Natriuretic Peptide: 6.8 pg/mL (ref 0.0–100.0)

## 2023-06-16 LAB — MAGNESIUM: Magnesium: 2 mg/dL (ref 1.7–2.4)

## 2023-06-16 LAB — ETHANOL: Alcohol, Ethyl (B): 273 mg/dL — ABNORMAL HIGH (ref <10)

## 2023-06-16 LAB — PROCALCITONIN: Procalcitonin: <0.1 ng/mL

## 2023-06-16 LAB — PHOSPHORUS: Phosphorus: 3.4 mg/dL (ref 2.5–4.6)

## 2023-06-16 LAB — TROPONIN I (HIGH SENSITIVITY): Troponin I (High Sensitivity): 4 ng/L (ref <18)

## 2023-06-16 LAB — LIPASE, BLOOD: Lipase: 32 U/L (ref 11–51)

## 2023-06-16 MED ORDER — LORAZEPAM 1 MG PO TABS: 1.0000 mg | ORAL_TABLET | ORAL | Status: DC | PRN

## 2023-06-16 MED ORDER — ALBUTEROL SULFATE (2.5 MG/3ML) 0.083% IN NEBU: 2.5000 mg | INHALATION_SOLUTION | RESPIRATORY_TRACT | Status: DC | PRN

## 2023-06-16 MED ORDER — ONDANSETRON HCL 4 MG/2ML IJ SOLN
4.0000 mg | Freq: Once | INTRAMUSCULAR | Status: AC
  Administered 2023-06-16: 4 mg via INTRAVENOUS
  Filled 2023-06-16: qty 2

## 2023-06-16 MED ORDER — ENOXAPARIN SODIUM 40 MG/0.4ML IJ SOSY
40.0000 mg | PREFILLED_SYRINGE | INTRAMUSCULAR | Status: DC
  Administered 2023-06-16 – 2023-06-29 (×14): 40 mg via SUBCUTANEOUS
  Filled 2023-06-16 (×14): qty 0.4

## 2023-06-16 MED ORDER — IOHEXOL 350 MG/ML SOLN
75.0000 mL | Freq: Once | INTRAVENOUS | Status: AC | PRN
  Administered 2023-06-16: 75 mL via INTRAVENOUS

## 2023-06-16 MED ORDER — ENSURE ENLIVE PO LIQD
237.0000 mL | Freq: Two times a day (BID) | ORAL | Status: DC
  Administered 2023-06-17 – 2023-06-30 (×23): 237 mL via ORAL

## 2023-06-16 MED ORDER — HYDRALAZINE HCL 20 MG/ML IJ SOLN: 5.0000 mg | INTRAMUSCULAR | Status: DC | PRN

## 2023-06-16 MED ORDER — DM-GUAIFENESIN ER 30-600 MG PO TB12: 1.0000 | ORAL_TABLET | Freq: Two times a day (BID) | ORAL | Status: DC | PRN

## 2023-06-16 MED ORDER — LORAZEPAM 2 MG/ML IJ SOLN
0.0000 mg | Freq: Two times a day (BID) | INTRAMUSCULAR | Status: AC
  Administered 2023-06-18: 3 mg via INTRAVENOUS
  Administered 2023-06-19: 1 mg via INTRAVENOUS
  Filled 2023-06-16: qty 2
  Filled 2023-06-16 (×2): qty 1

## 2023-06-16 MED ORDER — THIAMINE HCL 100 MG/ML IJ SOLN: 100.0000 mg | Freq: Every day | INTRAMUSCULAR | Status: DC

## 2023-06-16 MED ORDER — ATORVASTATIN CALCIUM 10 MG PO TABS
10.0000 mg | ORAL_TABLET | Freq: Every day | ORAL | Status: DC
  Administered 2023-06-17 – 2023-06-30 (×14): 10 mg via ORAL
  Filled 2023-06-16 (×14): qty 1

## 2023-06-16 MED ORDER — FOLIC ACID 1 MG PO TABS
1.0000 mg | ORAL_TABLET | Freq: Every day | ORAL | Status: DC
  Administered 2023-06-16 – 2023-06-30 (×15): 1 mg via ORAL
  Filled 2023-06-16 (×15): qty 1

## 2023-06-16 MED ORDER — ATENOLOL 25 MG PO TABS: 25.0000 mg | ORAL_TABLET | Freq: Every day | ORAL | Status: DC

## 2023-06-16 MED ORDER — ATENOLOL 25 MG PO TABS
25.0000 mg | ORAL_TABLET | Freq: Every day | ORAL | Status: DC
  Administered 2023-06-16 – 2023-06-30 (×15): 25 mg via ORAL
  Filled 2023-06-16 (×15): qty 1

## 2023-06-16 MED ORDER — ACETAMINOPHEN 325 MG PO TABS
650.0000 mg | ORAL_TABLET | Freq: Four times a day (QID) | ORAL | Status: DC | PRN
  Administered 2023-06-17 – 2023-06-25 (×6): 650 mg via ORAL
  Filled 2023-06-16 (×6): qty 2

## 2023-06-16 MED ORDER — LORAZEPAM 2 MG/ML IJ SOLN
0.0000 mg | Freq: Four times a day (QID) | INTRAMUSCULAR | Status: AC
  Administered 2023-06-17: 3 mg via INTRAVENOUS
  Administered 2023-06-17 – 2023-06-18 (×3): 2 mg via INTRAVENOUS
  Filled 2023-06-16 (×2): qty 1
  Filled 2023-06-16: qty 2
  Filled 2023-06-16 (×2): qty 1

## 2023-06-16 MED ORDER — TAMSULOSIN HCL 0.4 MG PO CAPS
0.4000 mg | ORAL_CAPSULE | Freq: Every day | ORAL | Status: DC
  Administered 2023-06-17 – 2023-06-30 (×14): 0.4 mg via ORAL
  Filled 2023-06-16 (×14): qty 1

## 2023-06-16 MED ORDER — THIAMINE MONONITRATE 100 MG PO TABS
100.0000 mg | ORAL_TABLET | Freq: Every day | ORAL | Status: DC
  Administered 2023-06-16 – 2023-06-18 (×3): 100 mg via ORAL
  Filled 2023-06-16 (×3): qty 1

## 2023-06-16 MED ORDER — ONDANSETRON HCL 4 MG/2ML IJ SOLN
4.0000 mg | Freq: Three times a day (TID) | INTRAMUSCULAR | Status: DC | PRN
  Administered 2023-06-17 – 2023-06-24 (×3): 4 mg via INTRAVENOUS
  Filled 2023-06-16 (×4): qty 2

## 2023-06-16 MED ORDER — SODIUM CHLORIDE 0.9% FLUSH: 10.0000 mL | INTRAVENOUS | Status: DC | PRN

## 2023-06-16 MED ORDER — SODIUM CHLORIDE 0.9 % IV BOLUS
500.0000 mL | Freq: Once | INTRAVENOUS | Status: AC
  Administered 2023-06-16: 500 mL via INTRAVENOUS

## 2023-06-16 MED ORDER — SODIUM CHLORIDE 0.9 % IV BOLUS
1000.0000 mL | Freq: Once | INTRAVENOUS | Status: AC
  Administered 2023-06-16: 1000 mL via INTRAVENOUS

## 2023-06-16 MED ORDER — LORAZEPAM 2 MG/ML IJ SOLN
1.0000 mg | INTRAMUSCULAR | Status: AC | PRN
  Administered 2023-06-16 – 2023-06-18 (×3): 2 mg via INTRAVENOUS
  Administered 2023-06-18: 3 mg via INTRAVENOUS
  Administered 2023-06-19: 2 mg via INTRAVENOUS
  Administered 2023-06-19: 3 mg via INTRAVENOUS
  Administered 2023-06-19: 2 mg via INTRAVENOUS
  Filled 2023-06-16 (×2): qty 1
  Filled 2023-06-16: qty 2
  Filled 2023-06-16: qty 1
  Filled 2023-06-16: qty 2
  Filled 2023-06-16: qty 1

## 2023-06-16 MED ORDER — LORAZEPAM 1 MG PO TABS: 1.0000 mg | ORAL_TABLET | ORAL | Status: AC | PRN

## 2023-06-16 MED ORDER — SODIUM CHLORIDE 0.9% FLUSH
10.0000 mL | Freq: Two times a day (BID) | INTRAVENOUS | Status: DC
  Administered 2023-06-16 – 2023-06-30 (×27): 10 mL

## 2023-06-16 MED ORDER — PANTOPRAZOLE SODIUM 40 MG IV SOLR
40.0000 mg | Freq: Two times a day (BID) | INTRAVENOUS | Status: DC
  Administered 2023-06-16 – 2023-06-18 (×4): 40 mg via INTRAVENOUS
  Filled 2023-06-16 (×4): qty 10

## 2023-06-16 MED ORDER — OXYCODONE HCL 5 MG PO TABS: 5.0000 mg | ORAL_TABLET | Freq: Four times a day (QID) | ORAL | Status: DC | PRN

## 2023-06-16 MED ORDER — SODIUM CHLORIDE 0.9 % IV BOLUS: 1000.0000 mL | Freq: Once | INTRAVENOUS | Status: DC

## 2023-06-16 MED ORDER — METOCLOPRAMIDE HCL 5 MG/ML IJ SOLN
10.0000 mg | Freq: Once | INTRAMUSCULAR | Status: AC
  Administered 2023-06-16: 10 mg via INTRAVENOUS
  Filled 2023-06-16: qty 2

## 2023-06-16 MED ORDER — ADULT MULTIVITAMIN W/MINERALS CH
1.0000 | ORAL_TABLET | Freq: Every day | ORAL | Status: DC
  Administered 2023-06-16 – 2023-06-30 (×15): 1 via ORAL
  Filled 2023-06-16 (×15): qty 1

## 2023-06-16 MED ORDER — SODIUM CHLORIDE 0.9 % IV SOLN: INTRAVENOUS | Status: DC

## 2023-06-16 NOTE — ED Notes (Signed)
First nurse note- per [facility] pt coming from home laying on couch c/o abdominal pain and weakness. Per EMS pt has had about half a bottle of wine today and had several beers cans laying around him.

## 2023-06-16 NOTE — ED Notes (Signed)
VO by Dr Modesto Charon to collect repeat lactic acid after fluid bolus
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
## 2023-06-16 NOTE — ED Notes (Signed)
Unable to obtain IV access at this time.

## 2023-06-16 NOTE — ED Triage Notes (Signed)
Pt to ED via [facility] from home for abdominal pain, fatigue, weakness, and emesis. Pt states that he has had sx's x 3 days. Pt states that his last drink was 6 hours ago. Pt is in NAD.

## 2023-06-16 NOTE — ED Notes (Signed)
Pt to CT at this time.

## 2023-06-16 NOTE — ED Provider Notes (Addendum)
[facility] Provider Note    Event Date/Time   First MD Initiated Contact with Patient 06/16/23 1555     (approximate)   History   Weakness and Emesis   HPI  Douglas Peterson. is a 72 y.o. male   Past medical history of chronic alcohol use, atrial fibrillation not on anticoagulation, hyponatremia, TBI, who presents to the emergency department with multiple symptoms.  He notes that he is continuing to have abdominal discomfort and nausea and vomiting.  He feels fatigued.  He has shortness of breath.  He has had a cough and feels like his chest is congested.  He denies fever or chest pain.  He denies abdominal pain or GU symptoms.  He continues to drink a significant amount of alcohol and his last drink was just prior to arrival.  He denies drug use.  When I ask him to clarify why he was here in the emergency department despite being evaluated 2 days ago, he notes that his cough is worse and his breathing is worse and all other symptoms are chronic and ongoing and unchanged.  External Medical Documents Reviewed: Emergency department visit dated 06/14/2023 when he was evaluated for nausea and vomiting and had a CT scan of the abdomen pelvis which was negative, chest x-ray negative, basic labs which showed hyponatremia much improved compared from his last hospitalization      Physical Exam   Triage Vital Signs: ED Triage Vitals  Encounter Vitals Group     BP 06/16/23 1326 (!) 147/87     Systolic BP Percentile --      Diastolic BP Percentile --      Pulse Rate 06/16/23 1326 93     Resp 06/16/23 1326 16     Temp 06/16/23 1326 97.7 F (36.5 C)     Temp Source 06/16/23 1326 Oral     SpO2 06/16/23 1253 92 %     Weight 06/16/23 1327 127 lb 13.9 oz (58 kg)     Height 06/16/23 1327 5\' 3"  (1.6 m)     Head Circumference --      Peak Flow --      Pain Score 06/16/23 1327 8     Pain Loc --      Pain Education --      Exclude from Growth Chart --     Most  recent vital signs: Vitals:   06/16/23 1900 06/16/23 2000  BP: (!) 172/106 (!) 160/89  Pulse: 99 91  Resp: 10 16  Temp:    SpO2: 92% 93%    General: Awake, no distress.  CV:  Good peripheral perfusion.  Resp:  Normal effort.  Abd:  No distention.  Other:  Chronically ill-appearing patient in no acute distress.  Soft nontender abdomen.  Appears euvolemic.  Not actively vomiting.  Lung sounds without focality or wheezing.  Left-sided mass in the inguinal area consistent with hernia, soft no significant tenderness, no overlying skin changes.   ED Results / Procedures / Treatments   Labs (all labs ordered are listed, but only abnormal results are displayed) Labs Reviewed  BASIC METABOLIC PANEL - Abnormal; Notable for the following components:      Result Value   Sodium 134 (*)    Chloride 96 (*)    Glucose, Bld 107 (*)    BUN 6 (*)    Creatinine, Ser 0.57 (*)    Calcium 8.2 (*)    All other components within normal limits  CBC -  Abnormal; Notable for the following components:   WBC 18.9 (*)    All other components within normal limits  URINALYSIS, ROUTINE W REFLEX MICROSCOPIC - Abnormal; Notable for the following components:   Color, Urine YELLOW (*)    APPearance CLEAR (*)    All other components within normal limits  HEPATIC FUNCTION PANEL - Abnormal; Notable for the following components:   AST 49 (*)    ALT 58 (*)    All other components within normal limits  ETHANOL - Abnormal; Notable for the following components:   Alcohol, Ethyl (B) 273 (*)    All other components within normal limits  LACTIC ACID, PLASMA - Abnormal; Notable for the following components:   Lactic Acid, Venous 2.0 (*)    All other components within normal limits  LACTIC ACID, PLASMA - Abnormal; Notable for the following components:   Lactic Acid, Venous 2.6 (*)    All other components within normal limits  RESP PANEL BY RT-PCR (RSV, FLU A&B, COVID)  [id number]  LIPASE, BLOOD  CBG MONITORING, ED   TROPONIN I (HIGH SENSITIVITY)     I ordered and reviewed the above labs they are notable for a white blood cell count is elevated to 18.9  EKG  ED ECG REPORT I, Pilar Jarvis, the attending physician, personally viewed and interpreted this ECG.   Date: 06/16/2023  EKG Time: 1328  Rate: 86  Rhythm: nsr  Axis: nl  Intervals:none  ST&T Change: no stemi    RADIOLOGY I independently reviewed and interpreted chest x-ray and I see no obvious focality or pneumothorax I also reviewed radiologist's formal read.   PROCEDURES:  Critical Care performed: No  Procedures   MEDICATIONS ORDERED IN ED: Medications  LORazepam (ATIVAN) tablet 1-4 mg (has no administration in time range)  thiamine (VITAMIN B1) tablet 100 mg (100 mg Oral Given 06/16/23 1614)    Or  thiamine (VITAMIN B1) injection 100 mg ( Intravenous See Alternative 06/16/23 1614)  folic acid (FOLVITE) tablet 1 mg (1 mg Oral Given 06/16/23 1614)  multivitamin with minerals tablet 1 tablet (1 tablet Oral Given 06/16/23 1614)  sodium chloride flush (NS) 0.9 % injection 10-40 mL (has no administration in time range)  sodium chloride flush (NS) 0.9 % injection 10-40 mL (has no administration in time range)  sodium chloride 0.9 % bolus 1,000 mL (has no administration in time range)  metoCLOPramide (REGLAN) injection 10 mg (has no administration in time range)  ondansetron (ZOFRAN) injection 4 mg (4 mg Intravenous Given 06/16/23 1615)  sodium chloride 0.9 % bolus 1,000 mL (0 mLs Intravenous Stopped 06/16/23 1937)  iohexol (OMNIPAQUE) 350 MG/ML injection 75 mL (75 mLs Intravenous Contrast Given 06/16/23 1837)    IMPRESSION / MDM / ASSESSMENT AND PLAN / ED COURSE  I reviewed the triage vital signs and the nursing notes.                                Patient's presentation is most consistent with acute presentation with potential threat to life or bodily function.  Differential diagnosis includes, but is not limited to,  electrolyte disturbance, alcohol intoxication, pancreatitis, cholecystitis, dyspepsia, aspiration pneumonia, pneumonia, PE   The patient is on the cardiac monitor to evaluate for evidence of arrhythmia and/or significant heart rate changes.  MDM:    Multiple complaints.  In terms of his chronic abdominal discomfort and nausea and vomiting he thinks his liver related to  his chronic alcohol use.  Recheck labs including LFTs, lipase.  Defer further advanced imaging of the abdomen pelvis given his normal exam just 2 days ago.  Soft nontender benign abdomen rules against surgical abdominal pathologies.  In terms of his shortness of breath worsening cough and history of PE not on anticoagulation, consider aspiration pneumonia given his leukocytosis, though chest x-ray shows no obvious opacities and he is afebrile, I did consider PE as well given his atrial fibrillation not on [facility] will obtain a CT angiogram of the chest.  For symptomatic treatment will give fluids and Zofran.    -- Workup has been largely unremarkable aside for some nonspecific white blood cell count elevation, minimal lactic acidosis of 2.0 likely in the setting of vomiting and mild dehydration, CT angiogram negative for any intrathoracic pathology or PE, troponin negative, and his ethanol level was in the high 200s.  He has been stable in the emergency department.  He got some fluids and Zofran, recheck lactic if downtrending plan is for discharge.  Fortunately his lactic increased from 2.0-2.6.  He still feels unwell although he is otherwise unremarkable workup as above.  I do not think he is septic as I do not have a source and he has no tachycardia, hypotension, fever.  His stable left-sided inguinal hernia is no significant tenderness to palpation, no overlying skin changes and had a CT abdomen and pelvis just 2 days ago showing stable unchanged quality of this longstanding left-sided inguinal hernia so I doubt this is causing  obstruction/strangulation or incarcerated hernia to explain his generalized abdominal pain and nausea.  Will give another liter of saline and admit for observation.     FINAL CLINICAL IMPRESSION(S) / ED DIAGNOSES   Final diagnoses:  Alcohol abuse  Nausea  Generalized abdominal pain  Acute bronchitis, unspecified organism  Lactic acidosis     Rx / DC Orders   ED Discharge Orders     None        Note:  This document was prepared using Dragon voice recognition software and may include unintentional dictation errors.    Pilar Jarvis, MD 06/16/23 1950    Pilar Jarvis, MD 06/16/23 [place]orn    Pilar Jarvis, MD 06/16/23 2014

## 2023-06-17 DIAGNOSIS — K4090 Unilateral inguinal hernia, without obstruction or gangrene, not specified as recurrent: Secondary | ICD-10-CM | POA: Diagnosis present

## 2023-06-17 DIAGNOSIS — R109 Unspecified abdominal pain: Secondary | ICD-10-CM | POA: Diagnosis not present

## 2023-06-17 DIAGNOSIS — E785 Hyperlipidemia, unspecified: Secondary | ICD-10-CM | POA: Diagnosis present

## 2023-06-17 DIAGNOSIS — K2920 Alcoholic gastritis without bleeding: Secondary | ICD-10-CM | POA: Diagnosis present

## 2023-06-17 DIAGNOSIS — I110 Hypertensive heart disease with heart failure: Secondary | ICD-10-CM | POA: Diagnosis present

## 2023-06-17 DIAGNOSIS — F10229 Alcohol dependence with intoxication, unspecified: Secondary | ICD-10-CM | POA: Diagnosis present

## 2023-06-17 DIAGNOSIS — F05 Delirium due to known physiological condition: Secondary | ICD-10-CM | POA: Diagnosis not present

## 2023-06-17 DIAGNOSIS — Y908 Blood alcohol level of 240 mg/100 ml or more: Secondary | ICD-10-CM | POA: Diagnosis present

## 2023-06-17 DIAGNOSIS — E871 Hypo-osmolality and hyponatremia: Secondary | ICD-10-CM | POA: Diagnosis present

## 2023-06-17 DIAGNOSIS — R1310 Dysphagia, unspecified: Secondary | ICD-10-CM | POA: Diagnosis present

## 2023-06-17 DIAGNOSIS — I480 Paroxysmal atrial fibrillation: Secondary | ICD-10-CM | POA: Diagnosis present

## 2023-06-17 DIAGNOSIS — I5032 Chronic diastolic (congestive) heart failure: Secondary | ICD-10-CM | POA: Diagnosis present

## 2023-06-17 DIAGNOSIS — K2100 Gastro-esophageal reflux disease with esophagitis, without bleeding: Secondary | ICD-10-CM | POA: Diagnosis not present

## 2023-06-17 DIAGNOSIS — Z87891 Personal history of nicotine dependence: Secondary | ICD-10-CM | POA: Diagnosis not present

## 2023-06-17 DIAGNOSIS — K4030 Unilateral inguinal hernia, with obstruction, without gangrene, not specified as recurrent: Secondary | ICD-10-CM | POA: Diagnosis present

## 2023-06-17 DIAGNOSIS — F1020 Alcohol dependence, uncomplicated: Secondary | ICD-10-CM | POA: Diagnosis not present

## 2023-06-17 DIAGNOSIS — Z1152 Encounter for screening for COVID-19: Secondary | ICD-10-CM | POA: Diagnosis not present

## 2023-06-17 DIAGNOSIS — Z8616 Personal history of COVID-19: Secondary | ICD-10-CM | POA: Diagnosis not present

## 2023-06-17 DIAGNOSIS — J209 Acute bronchitis, unspecified: Secondary | ICD-10-CM | POA: Diagnosis present

## 2023-06-17 DIAGNOSIS — F10239 Alcohol dependence with withdrawal, unspecified: Secondary | ICD-10-CM | POA: Diagnosis not present

## 2023-06-17 DIAGNOSIS — R5381 Other malaise: Secondary | ICD-10-CM | POA: Diagnosis present

## 2023-06-17 DIAGNOSIS — Z8249 Family history of ischemic heart disease and other diseases of the circulatory system: Secondary | ICD-10-CM | POA: Diagnosis not present

## 2023-06-17 DIAGNOSIS — N400 Enlarged prostate without lower urinary tract symptoms: Secondary | ICD-10-CM | POA: Diagnosis present

## 2023-06-17 DIAGNOSIS — M2450 Contracture, unspecified joint: Secondary | ICD-10-CM | POA: Diagnosis present

## 2023-06-17 DIAGNOSIS — E872 Acidosis, unspecified: Secondary | ICD-10-CM | POA: Diagnosis present

## 2023-06-17 DIAGNOSIS — I10 Essential (primary) hypertension: Secondary | ICD-10-CM | POA: Diagnosis not present

## 2023-06-17 DIAGNOSIS — M189 Osteoarthritis of first carpometacarpal joint, unspecified: Secondary | ICD-10-CM | POA: Diagnosis present

## 2023-06-17 DIAGNOSIS — K219 Gastro-esophageal reflux disease without esophagitis: Secondary | ICD-10-CM | POA: Diagnosis present

## 2023-06-17 DIAGNOSIS — R6510 Systemic inflammatory response syndrome (SIRS) of non-infectious origin without acute organ dysfunction: Secondary | ICD-10-CM | POA: Diagnosis present

## 2023-06-17 LAB — LACTIC ACID, PLASMA: Lactic Acid, Venous: 1.3 mmol/L (ref 0.5–1.9)

## 2023-06-17 MED ORDER — SODIUM CHLORIDE 0.9 % IV BOLUS
500.0000 mL | Freq: Once | INTRAVENOUS | Status: AC
  Administered 2023-06-17: 500 mL via INTRAVENOUS

## 2023-06-17 NOTE — Plan of Care (Signed)
Latic acid has improved. Call bell at reach and bed alarm in place.  Problem: Education: Goal: Knowledge of General Education information will improve Description: Including pain rating scale, medication(s)/side effects and non-pharmacologic comfort measures Outcome: Progressing   Problem: Health Behavior/Discharge Planning: Goal: Ability to manage health-related needs will improve Outcome: Progressing   Problem: Clinical Measurements: Goal: Ability to maintain clinical measurements within normal limits will improve Outcome: Progressing Goal: Will remain free from infection Outcome: Progressing Goal: Diagnostic test results will improve Outcome: Progressing Goal: Respiratory complications will improve Outcome: Progressing Goal: Cardiovascular complication will be avoided Outcome: Progressing   Problem: Activity: Goal: Risk for activity intolerance will decrease Outcome: Progressing   Problem: Nutrition: Goal: Adequate nutrition will be maintained Outcome: Progressing   Problem: Coping: Goal: Level of anxiety will decrease Outcome: Progressing   Problem: Elimination: Goal: Will not experience complications related to bowel motility Outcome: Progressing Goal: Will not experience complications related to urinary retention Outcome: Progressing   Problem: Pain Managment: Goal: General experience of comfort will improve Outcome: Progressing

## 2023-06-17 NOTE — Plan of Care (Signed)
  Problem: Activity: Goal: Risk for activity intolerance will decrease Outcome: Progressing   Problem: Nutrition: Goal: Adequate nutrition will be maintained 06/17/2023 0457 by Forestine Na, RN Outcome: Progressing 06/17/2023 0436 by Forestine Na, RN Outcome: Progressing   Problem: Coping: Goal: Level of anxiety will decrease 06/17/2023 0457 by Forestine Na, RN Outcome: Progressing 06/17/2023 0436 by Forestine Na, RN Outcome: Progressing   Problem: Pain Managment: Goal: General experience of comfort will improve Outcome: Progressing   Problem: Safety: Goal: Ability to remain free from injury will improve Outcome: Progressing

## 2023-06-17 NOTE — Progress Notes (Signed)
2mg  of IV ativan given for CIWA score of 11

## 2023-06-17 NOTE — Progress Notes (Signed)
PHARMACY - PHYSICIAN COMMUNICATION CRITICAL VALUE ALERT - BLOOD CULTURE IDENTIFICATION (BCID)  Douglas Lifsey. is an 72 y.o. male who presented to [facility] on 06/16/2023 with a chief complaint of abdominal pain and weakness  Assessment:  8/13 blood cultures with GPC in 1/2 sets (both bottles of the one set), BCID detected MRSE.  [name] not currently on antibiotic.  Name of physician (or Provider) Contacted: Dr Ashok Pall  Current antibiotics: None  Changes to prescribed antibiotics recommended:  Monitor off antibiotics  Results for orders placed or performed during the hospital encounter of 06/16/23  Blood Culture ID Panel (Reflexed) (Collected: 06/16/2023 10:34 PM)  Result Value Ref Range   Enterococcus faecalis NOT DETECTED NOT DETECTED   Enterococcus Faecium NOT DETECTED NOT DETECTED   Listeria monocytogenes NOT DETECTED NOT DETECTED   Staphylococcus species DETECTED (A) NOT DETECTED   Staphylococcus aureus (BCID) NOT DETECTED NOT DETECTED   Staphylococcus epidermidis DETECTED (A) NOT DETECTED   Staphylococcus lugdunensis NOT DETECTED NOT DETECTED   Streptococcus species NOT DETECTED NOT DETECTED   Streptococcus agalactiae NOT DETECTED NOT DETECTED   Streptococcus pneumoniae NOT DETECTED NOT DETECTED   Streptococcus pyogenes NOT DETECTED NOT DETECTED   A.calcoaceticus-baumannii NOT DETECTED NOT DETECTED   Bacteroides fragilis NOT DETECTED NOT DETECTED   Enterobacterales NOT DETECTED NOT DETECTED   Enterobacter cloacae complex NOT DETECTED NOT DETECTED   Escherichia coli NOT DETECTED NOT DETECTED   Klebsiella aerogenes NOT DETECTED NOT DETECTED   Klebsiella oxytoca NOT DETECTED NOT DETECTED   Klebsiella pneumoniae NOT DETECTED NOT DETECTED   Proteus species NOT DETECTED NOT DETECTED   Salmonella species NOT DETECTED NOT DETECTED   Serratia marcescens NOT DETECTED NOT DETECTED   Haemophilus influenzae NOT DETECTED NOT DETECTED   Neisseria meningitidis NOT DETECTED NOT  DETECTED   Pseudomonas aeruginosa NOT DETECTED NOT DETECTED   Stenotrophomonas maltophilia NOT DETECTED NOT DETECTED   Candida albicans NOT DETECTED NOT DETECTED   Candida auris NOT DETECTED NOT DETECTED   Candida glabrata NOT DETECTED NOT DETECTED   Candida krusei NOT DETECTED NOT DETECTED   Candida parapsilosis NOT DETECTED NOT DETECTED   Candida tropicalis NOT DETECTED NOT DETECTED   Cryptococcus neoformans/gattii NOT DETECTED NOT DETECTED   Methicillin resistance mecA/C DETECTED (A) NOT DETECTED   Douglas Peterson, PharmD, BCPS, BCIDP Work Cell: [phone] 06/17/2023 3:50 PM

## 2023-06-17 NOTE — Discharge Instructions (Signed)
 Intensive Outpatient Programs   [facility] [address] [place],  [place], [place] [phone]  [facility] (Inpatient and [phone] (Suboxone and Methadone) [address] [phone]  [facility] Programs - Intensive Outpatient [address] [place], [place] [address]  [phone]  [facility] (Outpatient, Inpatient, [facility] (Groups and Residental) (insurance only) [phone] [place], [place] [phone]   [facility] (for caregivers and family) [address] Laurell Josephs [address], [place], [place] [phone]  Residential Treatment Programs  [facility](2 years) Residential: 79 days)[facility] [address] [place], [place], [place] [phone] or [phone]  [facility] [address] [place], [place], [place] [phone]  [facility] ([facility] [address] [place], [place], [place] [phone] Admissions: 8am-3pm M-F  [facility] Program: Residential Program [phone] Days)             [facility]: [facility]  [place], [place], [place]  [phone] or [phone] in Hours over the weekend or by referral)  [facility] [address] [place], [place] [address] [phone] (Do virtual or phone assessment, offer transportation within 25 miles, have in patient and Outpatient options)   [facility]: [facility]:[phone] (for crisis  response 24 hours a day)

## 2023-06-17 NOTE — [place] FL2 (Signed)
[place] MEDICAID FL2 LEVEL OF CARE FORM     IDENTIFICATION  Patient Name: Douglas Peterson. Birthdate: 01-May-1951 Sex: male Admission Date (Current Location): 06/16/2023  [place] and [place] Number:  Chiropodist and Address:  [facility], [address], [place], [place] [address]      Provider Number: [id number]  Attending Physician Name and Address:  Kathrynn Running, MD  Relative Name and Phone Number:       Current Level of Care: Hospital Recommended Level of Care: Skilled Nursing Facility Prior Approval Number:    Date Approved/Denied:   PASRR Number: [id number]  Discharge Plan: SNF    Current Diagnoses: Patient Active Problem List   Diagnosis Date Noted   Abdominal pain 06/16/2023   Elevated lactic acid level 06/16/2023   Chronic diastolic CHF (congestive heart failure) (HCC) 06/16/2023   Increased urinary frequency 06/08/2023   Diarrhea 06/08/2023   Upper respiratory infection 06/04/2023   Enteritis 06/02/2023   Polyuria 06/02/2023   Dyslipidemia 05/01/2023   Alcohol dependence (HCC) 05/01/2023   Maggot infestation feet 05/11/2022   Unable to care for self 05/11/2022   Debility 04/25/2022   Weakness 04/23/2022   Epistaxis 04/23/2022   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   Generalized weakness 03/18/2022   Nondisplaced fracture of shaft of left clavicle, initial encounter for closed fracture 03/18/2022   BPH (benign prostatic hyperplasia) 03/18/2022   Clavicle fracture 03/17/2022   Fall 03/17/2022   CAP (community acquired pneumonia) 03/13/2022   Altered mental status, unspecified 02/20/2022   Hypotension 02/19/2022   Dysphonia 08/13/2021   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   Paresthesia of right upper extremity 08/13/2021   SIRS (systemic inflammatory response syndrome) (HCC) 08/05/2020   GERD (gastroesophageal reflux disease) 06/05/2020   Alcohol use disorder, severe, dependence (HCC) 05/28/2020    Paroxysmal A-fib (HCC) 03/17/2020   Primary insomnia 02/06/2020   Left inguinal hernia 01/31/2020   Encounter for competency evaluation    Depression 01/17/2020   Pancytopenia (HCC)    Hypomagnesemia    Alcoholic cirrhosis of liver without ascites (HCC)    ETOH abuse    Alcohol intoxication (HCC)    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) 09/27/2019   Hypokalemia 09/27/2019   Alcohol withdrawal delirium, acute, hyperactive (HCC) 09/21/2019   Pressure injury of ankle, stage 1 08/15/2019   Palliative care by specialist    Closed fracture of right proximal humerus 03/19/2019   Vitamin D deficiency 01/11/2019   Osteoporosis 12/22/2018   Closed nondisplaced fracture of acromial process of right scapula with routine healing 11/15/2018   Compression fracture of L2 vertebra with routine healing 11/15/2018   Delirium tremens (HCC) 03/08/2018   Essential hypertension 09/16/2017   Encephalopathy, 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   Steatohepatitis 12/03/2016   Abnormal liver enzymes 06/17/2016   Hereditary hemochromatosis (HCC) 06/17/2016   Anxiety 07/16/2015   Hyponatremia 07/09/2015   H/O traumatic brain injury 05/22/2014   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HCC) 05/22/2014    Orientation RESPIRATION BLADDER Height & Weight     Self, Time, Situation, Place  Normal Continent Weight: 131 lb 2.8 oz (59.5 kg) Height:  5\' 3"  (160 cm)  BEHAVIORAL SYMPTOMS/MOOD NEUROLOGICAL BOWEL NUTRITION STATUS   (None)  (None) Continent Diet (DYS 3)  AMBULATORY STATUS COMMUNICATION OF NEEDS Skin   Limited Assist Verbally PU Stage and Appropriate Care, Bruising, Other (Comment) (Erythema/redness. MASD on buttocks: No dressing.) PU Stage 1 Dressing:  (Buttocks (Foam).)  Personal Care Assistance Level of Assistance              Functional Limitations Info  Sight, Hearing, Speech Sight Info: Adequate Hearing Info: Adequate Speech Info: Adequate    SPECIAL  CARE FACTORS FREQUENCY  PT (By licensed PT)     PT Frequency: 5 x week              Contractures Contractures Info: Not present    Additional Factors Info  Code Status, Allergies Code Status Info: Full code Allergies Info: Hydrochlorothiazide           Current Medications (06/17/2023):  This is the current hospital active medication list Current Facility-Administered Medications  Medication Dose Route Frequency Provider Last Rate Last Admin   acetaminophen (TYLENOL) tablet 650 mg  650 mg Oral Q6H PRN Lorretta Harp, MD   650 mg at 06/17/23 1215   albuterol (PROVENTIL) (2.5 MG/3ML) 0.083% nebulizer solution 2.5 mg  2.5 mg Inhalation Q4H PRN Lorretta Harp, MD       atenolol (TENORMIN) tablet 25 mg  25 mg Oral Daily Lorretta Harp, MD   25 mg at 06/17/23 0902   atorvastatin (LIPITOR) tablet 10 mg  10 mg Oral Daily Lorretta Harp, MD   10 mg at 06/17/23 0902   dextromethorphan-guaiFENesin (MUCINEX DM) 30-600 MG per 12 hr tablet 1 tablet  1 tablet Oral BID PRN Lorretta Harp, MD       enoxaparin (LOVENOX) injection 40 mg  40 mg Subcutaneous Q24H Lorretta Harp, MD   40 mg at 06/16/23 2244   feeding supplement (ENSURE ENLIVE / ENSURE PLUS) liquid 237 mL  237 mL Oral BID BM Lorretta Harp, MD   237 mL at 06/17/23 1412   folic acid (FOLVITE) tablet 1 mg  1 mg Oral Daily Pilar Jarvis, MD   1 mg at 06/17/23 4034   hydrALAZINE (APRESOLINE) injection 5 mg  5 mg Intravenous Q2H PRN Lorretta Harp, MD       LORazepam (ATIVAN) injection 0-4 mg  0-4 mg Intravenous Q6H Lorretta Harp, MD   2 mg at 06/17/23 1413   Followed by   Melene Muller ON 06/18/2023] LORazepam (ATIVAN) injection 0-4 mg  0-4 mg Intravenous Q12H Lorretta Harp, MD       LORazepam (ATIVAN) tablet 1-4 mg  1-4 mg Oral Q1H PRN Lorretta Harp, MD       Or   LORazepam (ATIVAN) injection 1-4 mg  1-4 mg Intravenous Q1H PRN Lorretta Harp, MD   2 mg at 06/17/23 0451   multivitamin with minerals tablet 1 tablet  1 tablet Oral Daily Pilar Jarvis, MD   1 tablet at 06/17/23 0903   ondansetron  (ZOFRAN) injection 4 mg  4 mg Intravenous Q8H PRN Lorretta Harp, MD   4 mg at 06/17/23 1217   pantoprazole (PROTONIX) injection 40 mg  40 mg Intravenous Q12H Lorretta Harp, MD   40 mg at 06/17/23 0903   sodium chloride flush (NS) 0.9 % injection 10-40 mL  10-40 mL Intracatheter Q12H Pilar Jarvis, MD   10 mL at 06/17/23 0910   sodium chloride flush (NS) 0.9 % injection 10-40 mL  10-40 mL Intracatheter PRN Pilar Jarvis, MD       tamsulosin [facility]) capsule 0.4 mg  0.4 mg Oral Daily Lorretta Harp, MD   0.4 mg at 06/17/23 7425   thiamine (VITAMIN B1) tablet 100 mg  100 mg Oral Daily Pilar Jarvis, MD   100 mg at 06/17/23 9563   Or  thiamine (VITAMIN B1) injection 100 mg  100 mg Intravenous Daily Pilar Jarvis, MD         Discharge Medications: Please see discharge summary for a list of discharge medications.  Relevant Imaging Results:  Relevant Lab Results:   Additional Information SS#: [id number]. Went to [facility] on 7/9. I believe discharge date based on notes was 7/26.  Margarito Liner, LCSW

## 2023-06-17 NOTE — Evaluation (Signed)
Physical Therapy Evaluation Patient Details Name: Douglas Peterson. MRN: [id number] DOB: [birth year] Today's Date: 06/17/2023  History of Present Illness  72 y.o. 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     Patient states that he has nausea, vomiting, abdominal pain in the past 3 days.  He has several episodes of nonbilious nonbloody vomiting.  Denies diarrhea.  His abdominal pain is located in central and upper abdomen, intermittent, mild to moderate, aching, nonradiating.  Pt was here for similar 8/11.  Clinical Impression  Pt pleasant t/o session, eager to work with PT.  He reports that he has been clean and sober for 6 months (came in the [facility]).  He was able to ambulate ~65 ft with SPC but was slower and less stable than this PT has seen him on prior admits.  He requested to sit 2/2 fatigue and everything feeling weak and heavy. He is adamant that he wants to go home and despite recent struggle to manage (had spent 10(?) months at ALF).  Pt swears he can manage at home, PT did have conversation about his record of many admissions due to inability to manage safely at home - he is determined to do so.  Pt will need continued PT to address functional limitations and try to promote as much safety as possible on discharge.    If plan is discharge home, recommend the following: Help with stairs or ramp for entrance;Direct supervision/assist for medications management;Assist for transportation;A little help with walking and/or transfers;A little help with bathing/dressing/bathroom;Assistance with cooking/housework   Can travel by private vehicle   Yes    Equipment Recommendations None recommended by PT  Recommendations for Other  Services       Functional Status Assessment Patient has had a recent decline in their functional status and demonstrates the ability to make significant improvements in function in a reasonable and predictable amount of time.     Precautions / Restrictions Precautions Precautions: Fall Restrictions Weight Bearing Restrictions: No      Mobility  Bed Mobility Overal bed mobility: Modified Independent Bed Mobility: Supine to Sit     Supine to sit: Contact guard     General bed mobility comments: Able to get himself up to sitting EOB w/o assist    Transfers Overall transfer level: Needs assistance Equipment used: Straight cane Transfers: Sit to/from Stand Sit to Stand: Supervision           General transfer comment: Pt labored to get to standing, heavy use of L UE on cane to rise, did not need direct assist    Ambulation/Gait Ambulation/Gait assistance: Contact guard assist, Min assist Gait Distance (Feet): 65 Feet Assistive device: Straight cane         General Gait Details: Pt reports "I'm so weak, my feet weigh a ton" but was able to ambualte into the hallway with slow, labored gait, heavy reliance on the cane.  Slow speed with general unsteadiness/weakness but no overt LOBs.  Pt struggled with consistent cadence and requested to sit after relatively short effort (walked >2000 ft during hospitalization earlier this month)  Stairs            Wheelchair Mobility     Tilt Bed    Modified Rankin (Stroke Patients Only)  Balance Overall balance assessment: Needs assistance Sitting-balance support: Single extremity supported, Feet supported Sitting balance-Leahy Scale: Good     Standing balance support: Reliant on assistive device for balance Standing balance-Leahy Scale: Fair Standing balance comment: general unsteadiness and lack of confidence but no LOBs                             Pertinent Vitals/Pain Pain Assessment Pain  Assessment: No/denies pain    Home Living Family/patient expects to be discharged to:: Private residence Living Arrangements: Alone Available Help at Discharge: Neighbor;Available PRN/intermittently Type of Home: House Home Access: Stairs to enter Entrance Stairs-Rails: Can reach both Entrance Stairs-Number of Steps: 2   Home Layout: Two level Home Equipment: Cane - single point Additional Comments: [place] so not around to help much anymore    Prior Function Prior Level of Function : Independent/Modified Independent             Mobility Comments: Pt uses SPC for mobility PRN. Described community ambulator. No falls history. ADLs Comments: reports he now orders groceries online for delivery     Extremity/Trunk Assessment   Upper Extremity Assessment Upper Extremity Assessment: RUE deficits/detail RUE Deficits / Details: chronic RUE contracture s/p CVAs    Lower Extremity Assessment Lower Extremity Assessment: Generalized weakness (chronic R weakness not nearly as limiting as UE)       Communication   Communication Communication: No apparent difficulties (baseline weak voice)  Cognition Arousal: Alert Behavior During Therapy: WFL for tasks assessed/performed Overall Cognitive Status: Within Functional Limits for tasks assessed                                 General Comments: Pt is A and O x 4        General Comments General comments (skin integrity, edema, etc.): Pt well known to the PT, he is not walking as well as he is able at  sober baseline but this effort was typical for first day after coming to ED.    Exercises     Assessment/Plan    PT Assessment Patient needs continued PT services  PT Problem List Decreased strength;Decreased activity tolerance;Decreased balance;Decreased mobility;Impaired tone       PT Treatment Interventions DME instruction;Gait training;Balance training;Stair training;Therapeutic  activities;Patient/family education    PT Goals (Current goals can be found in the Care Plan section)  Acute Rehab PT Goals Patient Stated Goal: go home PT Goal Formulation: With patient Time For Goal Achievement: 06/30/23 Potential to Achieve Goals: Fair    Frequency Min 1X/week     Co-evaluation               AM-PAC PT "6 Clicks" Mobility  Outcome Measure Help needed turning from your back to your side while in a flat bed without using bedrails?: None Help needed moving from lying on your back to sitting on the side of a flat bed without using bedrails?: A Little Help needed moving to and from a bed to a chair (including a wheelchair)?: A Little Help needed standing up from a chair using your arms (e.g., wheelchair or bedside chair)?: A Little Help needed to walk in hospital room?: A Little Help needed climbing 3-5 steps with a railing? : A Lot 6 Click Score: 18    End of Session Equipment Utilized During Treatment: Gait belt Activity Tolerance: Patient tolerated  treatment well;Patient limited by fatigue Patient left: in chair;with call bell/phone within reach;with chair alarm set Nurse Communication: Mobility status PT Visit Diagnosis: Unsteadiness on feet (R26.81);Difficulty in walking, not elsewhere classified (R26.2);Muscle weakness (generalized) (M62.81)    Time: 1610-9604 PT Time Calculation (min) (ACUTE ONLY): 35 min   Charges:   PT Evaluation $PT Eval Low Complexity: 1 Low PT Treatments $Gait Training: 8-22 mins PT General Charges $$ ACUTE PT VISIT: 1 Visit         Malachi Pro, DPT 06/17/2023, 1:50 PM

## 2023-06-17 NOTE — Progress Notes (Signed)
Mobility Specialist - Progress Note   06/17/23 1100  Mobility  Activity Ambulated with assistance in hallway  Level of Assistance Standby assist, set-up cues, supervision of patient - no hands on  Assistive Device Cane  Distance Ambulated (ft) 90 ft  Activity Response Tolerated well  $Mobility charge 1 Mobility     Pt lying in bed upon arrival, utilizing RA. Agreeable to activity. Pt completed bed mobility modI. STS and ambulation in hallway with minG-CGA. Pt voiced fatigue then later pain in BLE with ambulation. 1 mild stumble corrected. Pt motivated to finish loop however d/t familiarity with pt, author initiated seated rest break. Pt assisted back to room and returned to bed. Alarm set, needs in reach.    [name] Mobility Specialist 06/17/23, 11:13 AM

## 2023-06-17 NOTE — TOC Initial Note (Addendum)
Transition of Care (TOC) - Initial/Assessment Note    Patient Details  Name: Douglas Peterson. MRN: [id number] Date of Birth: 24-Oct-1951  Transition of Care [facility]) CM/SW Contact:    [name], RN Phone Number: 06/17/2023, 4:23 PM  Clinical Narrative:                  Admitted for: Abdominal pain Admitted from: home alone WUJ:[id number] last discharge patient had first PCP appointment with Dr Sherrin Daisy, per nina from [facility]  Therapy recommending SNF Patient in agreement. Patient accepts bed at [facility], accepted in [facility], tammy at [facility] to start auth today.   MD updated     SA resources added to AVS Patient Goals and CMS Choice            Expected Discharge Plan and Services                                              Prior Living Arrangements/Services                       Activities of Daily Living Home Assistive Devices/Equipment: Cane (specify quad or straight), Walker (specify type) ADL Screening (condition at time of admission) Patient's cognitive ability adequate to safely complete daily activities?: Yes Is the patient deaf or have difficulty hearing?: Yes Does the patient have difficulty seeing, even when wearing glasses/contacts?: No Does the patient have difficulty concentrating, remembering, or making decisions?: No Patient able to express need for assistance with ADLs?: Yes Does the patient have difficulty dressing or bathing?: No Independently performs ADLs?: Yes (appropriate for developmental age) Communication: Independent Does the patient have difficulty walking or climbing stairs?: No Weakness of Legs: Left Weakness of Arms/Hands: Right  Permission Sought/Granted                  Emotional Assessment              Admission diagnosis:  Lactic acidosis [E87.20] Alcohol abuse [F10.10] Nausea [R11.0] Generalized abdominal pain [R10.84] Left inguinal hernia [K40.90] Abdominal pain [R10.9] Acute bronchitis,  unspecified organism [J20.9] Patient Active Problem List   Diagnosis Date Noted   Abdominal pain 06/16/2023   Elevated lactic acid level 06/16/2023   Chronic diastolic CHF (congestive heart failure) (HCC) 06/16/2023   Increased urinary frequency 06/08/2023   Diarrhea 06/08/2023   Upper respiratory infection 06/04/2023   Enteritis 06/02/2023   Polyuria 06/02/2023   Dyslipidemia 05/01/2023   Alcohol dependence (HCC) 05/01/2023   Maggot infestation feet 05/11/2022   Unable to care for self 05/11/2022   Debility 04/25/2022   Weakness 04/23/2022   Epistaxis 04/23/2022   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   Generalized weakness 03/18/2022   Nondisplaced fracture of shaft of left clavicle, initial encounter for closed fracture 03/18/2022   BPH (benign prostatic hyperplasia) 03/18/2022   Clavicle fracture 03/17/2022   Fall 03/17/2022   CAP (community acquired pneumonia) 03/13/2022   Altered mental status, unspecified 02/20/2022   Hypotension 02/19/2022   Dysphonia 08/13/2021   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   Paresthesia of right upper extremity 08/13/2021   SIRS (systemic inflammatory response syndrome) (HCC) 08/05/2020   GERD (gastroesophageal reflux disease) 06/05/2020   Alcohol use disorder, severe, dependence (HCC) 05/28/2020   Paroxysmal A-fib (HCC) 03/17/2020   Primary insomnia 02/06/2020   Left inguinal hernia 01/31/2020  Encounter for competency evaluation    Depression 01/17/2020   Pancytopenia (HCC)    Hypomagnesemia    Alcoholic cirrhosis of liver without ascites (HCC)    ETOH abuse    Alcohol intoxication (HCC)    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) 09/27/2019   Hypokalemia 09/27/2019   Alcohol withdrawal delirium, acute, hyperactive (HCC) 09/21/2019   Pressure injury of ankle, stage 1 08/15/2019   Palliative care by specialist    Closed fracture of right proximal humerus 03/19/2019   Vitamin D deficiency 01/11/2019    Osteoporosis 12/22/2018   Closed nondisplaced fracture of acromial process of right scapula with routine healing 11/15/2018   Compression fracture of L2 vertebra with routine healing 11/15/2018   Delirium tremens (HCC) 03/08/2018   Essential hypertension 09/16/2017   Encephalopathy, 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   Steatohepatitis 12/03/2016   Abnormal liver enzymes 06/17/2016   Hereditary hemochromatosis (HCC) 06/17/2016   Anxiety 07/16/2015   Hyponatremia 07/09/2015   H/O traumatic brain injury 05/22/2014   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HCC) 05/22/2014   PCP:  Melonie Florida, FNP Pharmacy:   [facility] - [place], [place] - [address] [place] [address] Phone: [phone] Fax: [phone]  [facility] [address] [place] [address] Phone: [phone] Fax: [phone]     Social Determinants of Health (SDOH) Social History: SDOH Screenings   Food Insecurity: No Food Insecurity (06/16/2023)  Housing: Low Risk  (06/16/2023)  Transportation Needs: No Transportation Needs (06/16/2023)  Utilities: Not At Risk (06/16/2023)  Financial Resource Strain: Unknown (06/13/2019)  Physical Activity: Unknown (06/13/2019)  Social Connections: Unknown (06/13/2019)  Stress: Unknown (06/13/2019)  Tobacco Use: Medium Risk (06/16/2023)   SDOH Interventions:     Readmission Risk Interventions    06/17/2023    4:23 PM 04/29/2022   10:26 AM 03/14/2022   12:48 PM  Readmission Risk Prevention Plan  Transportation Screening Complete Complete Complete  PCP or Specialist Appt within 3-5 Days   Complete  HRI or Home Care Consult Complete Complete Complete  Social Work Consult for Recovery Care Planning/Counseling Complete Complete Complete  Palliative Care Screening Not Applicable Not Applicable Not Applicable  Medication Review Oceanographer) Complete Complete Complete

## 2023-06-17 NOTE — Progress Notes (Addendum)
PROGRESS NOTE    Douglas Peterson.  UUV:[id number] DOB: 07/01/1951 DOA: 06/16/2023 PCP: Melonie Florida, FNP      Brief Narrative:   Past medical history of chronic alcohol use, atrial fibrillation not on anticoagulation, hyponatremia, TBI, who presents to the emergency department with multiple symptoms.   He notes that he is continuing to have abdominal discomfort and nausea and vomiting.  He feels fatigued.  He has shortness of breath.  He has had a cough and feels like his chest is congested.  He denies fever or chest pain.  He denies abdominal pain or GU symptoms.   He continues to drink a significant amount of alcohol and his last drink was just prior to arrival.  He denies drug use.   When I ask him to clarify why he was here in the emergency department despite being evaluated 2 days ago, he notes that his cough is worse and his breathing is worse and all other symptoms are chronic and ongoing and unchanged.   Assessment & Plan:   Principal Problem:   Abdominal pain Active Problems:   Alcohol use disorder, severe, dependence (HCC)   SIRS (systemic inflammatory response syndrome) (HCC)   Elevated lactic acid level   GERD (gastroesophageal reflux disease)   Chronic diastolic CHF (congestive heart failure) (HCC)   Paroxysmal A-fib (HCC)   Dyslipidemia   Essential hypertension   BPH (benign prostatic hyperplasia)   Left inguinal hernia  # Abdominal pain Today resolved. CT abd/pelvis 8/11 negative. Normal labs here. Suspect gastritis from alcohol abuse - continue PPI  # Lactic acidosis 2/2 alcohol intoxication, resolved with fluids. No s/s infection - d/c fluids - monitor cultures  # Staph epi positive blood culture In one of two bottles, clinically relatively low suspicion for bacteremia - will plan to follow clinically  # Alcohol abuse Mild withdrawal symptoms today - continue ciwa lorazepam, vitamins  # Debility PT advising SNF, patient initially hesitant but  now says he doesn't feel safe going home and requests SNF - TOC consulted  # A-fib, paroxysmal Rate controlled, not on anticoagulant - continue coreg  # HFpEF Euvolemic  # HTN Controlled - cont home coreg  # BPH - cont home flomax   DVT prophylaxis: lovenox Code Status: full Family Communication: no answer when daughter telephoned today  Level of care: Telemetry Medical Status is: Observation    Consultants:  none  Procedures: none  Antimicrobials:  none    Subjective: Reports feeling generally weak, no abd pain today  Objective: Vitals:   06/16/23 2207 06/17/23 0407 06/17/23 0409 06/17/23 0831  BP: 131/76  (!) 140/90 (!) 169/96  Pulse: 84  71 76  Resp: 18  18 16   Temp: 98.3 F (36.8 C)  98.3 F (36.8 C) (!) 97.5 F (36.4 C)  TempSrc: Oral  Oral   SpO2: 95%  96% 98%  Weight:  59.5 kg    Height:        Intake/Output Summary (Last 24 hours) at 06/17/2023 1450 Last data filed at 06/17/2023 1221 Gross per 24 hour  Intake 1885 ml  Output 400 ml  Net 1485 ml   Filed Weights   06/16/23 1327 06/17/23 0407  Weight: 58 kg 59.5 kg    Examination:  General exam: Appears calm and comfortable  Respiratory system: Clear to auscultation. Respiratory effort normal. Cardiovascular system: S1 & S2 heard, RRR. No JVD, murmurs, rubs, gallops or clicks. No pedal edema. Gastrointestinal system: Abdomen is nondistended, soft and nontender. No  organomegaly or masses felt. Normal bowel sounds heard. Central nervous system: Alert and oriented. No focal neurological deficits. Extremities: right arm contracted Skin: No rashes, lesions or ulcers Psychiatry: calm, appears depressed    Data Reviewed: I have personally reviewed following labs and imaging studies  CBC: Recent Labs  Lab 06/14/23 1554 06/16/23 1410 06/17/23 0151  WBC 16.2* 18.9* 11.7*  HGB 15.8 16.8 13.7  HCT 46.3 48.2 39.6  MCV 91.0 90.1 92.1  PLT 274 254 190   Basic Metabolic Panel: Recent  Labs  Lab 06/14/23 1554 06/16/23 1410 06/17/23 0151  NA 131* 134* 137  K 4.5 3.8 3.8  CL 93* 96* 103  CO2 28 26 24   GLUCOSE 105* 107* 79  BUN 7* 6* 7*  CREATININE 0.57* 0.57* 0.55*  CALCIUM 8.5* 8.2* 7.5*  MG  --  2.0  --   PHOS  --  3.4  --    GFR: Estimated Creatinine Clearance: 68.2 mL/min (A) (by C-G formula based on SCr of 0.55 mg/dL (L)). Liver Function Tests: Recent Labs  Lab 06/14/23 1554 06/16/23 1410 06/17/23 0151  AST 57* 49* 36  ALT 62* 58* 41  ALKPHOS 78 82 64  BILITOT 0.9 0.9 0.9  PROT 7.6 7.4 5.6*  ALBUMIN 3.7 3.9 2.8*   Recent Labs  Lab 06/14/23 1554 06/16/23 1410  LIPASE 35 32   No results for input(s): "AMMONIA" in the last 168 hours. Coagulation Profile: No results for input(s): "INR", "PROTIME" in the last 168 hours. Cardiac Enzymes: No results for input(s): "CKTOTAL", "CKMB", "CKMBINDEX", "TROPONINI" in the last 168 hours. BNP (last 3 results) No results for input(s): "PROBNP" in the last 8760 hours. HbA1C: No results for input(s): "HGBA1C" in the last 72 hours. CBG: No results for input(s): "GLUCAP" in the last 168 hours. Lipid Profile: No results for input(s): "CHOL", "HDL", "LDLCALC", "TRIG", "CHOLHDL", "LDLDIRECT" in the last 72 hours. Thyroid Function Tests: No results for input(s): "TSH", "T4TOTAL", "FREET4", "T3FREE", "THYROIDAB" in the last 72 hours. Anemia Panel: No results for input(s): "VITAMINB12", "FOLATE", "FERRITIN", "TIBC", "IRON", "RETICCTPCT" in the last 72 hours. Urine analysis:    Component Value Date/Time   COLORURINE YELLOW (A) 06/16/2023 1718   APPEARANCEUR CLEAR (A) 06/16/2023 1718   LABSPEC 1.016 06/16/2023 1718   PHURINE 6.0 06/16/2023 1718   GLUCOSEU NEGATIVE 06/16/2023 1718   HGBUR NEGATIVE 06/16/2023 1718   BILIRUBINUR NEGATIVE 06/16/2023 1718   KETONESUR NEGATIVE 06/16/2023 1718   PROTEINUR NEGATIVE 06/16/2023 1718   NITRITE NEGATIVE 06/16/2023 1718   LEUKOCYTESUR NEGATIVE 06/16/2023 1718   Sepsis  Labs: @LABRCNTIP (procalcitonin:4,lacticidven:4)  ) Recent Results (from the past 240 hour(s))  Resp panel by RT-PCR (RSV, Flu A&B, Covid) Anterior Nasal Swab     Status: None   Collection Time: 06/16/23  4:40 PM   Specimen: Anterior Nasal Swab  Result Value Ref Range Status   SARS Coronavirus 2 by RT PCR NEGATIVE NEGATIVE Final    Comment: (NOTE) SARS-CoV-2 target nucleic acids are NOT DETECTED.  The SARS-CoV-2 RNA is generally detectable in upper respiratory specimens during the acute phase of infection. The lowest concentration of SARS-CoV-2 viral copies this assay can detect is 138 copies/mL. A negative result does not preclude SARS-Cov-2 infection and should not be used as the sole basis for treatment or other patient management decisions. A negative result may occur with  improper specimen collection/handling, submission of specimen other than nasopharyngeal swab, presence of viral mutation(s) within the areas targeted by this assay, and inadequate number of viral  copies(<138 copies/mL). A negative result must be combined with clinical observations, patient history, and epidemiological information. The expected result is Negative.  Fact Sheet for Patients:  BloggerCourse.com  Fact Sheet for Healthcare Providers:  SeriousBroker.it  This test is no t yet approved or cleared by the [place] FDA and  has been authorized for detection and/or diagnosis of SARS-CoV-2 by FDA under an Emergency Use Authorization (EUA). This EUA will remain  in effect (meaning this test can be used) for the duration of the COVID-19 declaration under Section 564(b)(1) of the Act, 21 U.S.C.section 360bbb-3(b)(1), unless the authorization is terminated  or revoked sooner.       Influenza A by PCR NEGATIVE NEGATIVE Final   Influenza B by PCR NEGATIVE NEGATIVE Final    Comment: (NOTE) The Xpert Xpress SARS-CoV-2/FLU/RSV plus assay is intended as  an aid in the diagnosis of influenza from Nasopharyngeal swab specimens and should not be used as a sole basis for treatment. Nasal washings and aspirates are unacceptable for Xpert Xpress SARS-CoV-2/FLU/RSV testing.  Fact Sheet for Patients: BloggerCourse.com  Fact Sheet for Healthcare Providers: SeriousBroker.it  This test is not yet approved or cleared by the [place] FDA and has been authorized for detection and/or diagnosis of SARS-CoV-2 by FDA under an Emergency Use Authorization (EUA). This EUA will remain in effect (meaning this test can be used) for the duration of the COVID-19 declaration under Section 564(b)(1) of the Act, 21 U.S.C. section 360bbb-3(b)(1), unless the authorization is terminated or revoked.     Resp Syncytial Virus by PCR NEGATIVE NEGATIVE Final    Comment: (NOTE) Fact Sheet for Patients: BloggerCourse.com  Fact Sheet for Healthcare Providers: SeriousBroker.it  This test is not yet approved or cleared by the [place] FDA and has been authorized for detection and/or diagnosis of SARS-CoV-2 by FDA under an Emergency Use Authorization (EUA). This EUA will remain in effect (meaning this test can be used) for the duration of the COVID-19 declaration under Section 564(b)(1) of the Act, 21 U.S.C. section 360bbb-3(b)(1), unless the authorization is terminated or revoked.  Performed at [facility], [address] [place], [place] [address]   Culture, blood (x 2)     Status: None (Preliminary result)   Collection Time: 06/16/23 10:27 PM   Specimen: BLOOD  Result Value Ref Range Status   Specimen Description BLOOD BLOOD LEFT HAND  Final   Special Requests   Final    BOTTLES DRAWN AEROBIC AND ANAEROBIC Blood Culture adequate volume   Culture   Final    NO GROWTH < 12 HOURS Performed at [facility], [address],  [place], [place] [address]    Report Status PENDING  Incomplete  Culture, blood (x 2)     Status: None (Preliminary result)   Collection Time: 06/16/23 10:34 PM   Specimen: BLOOD  Result Value Ref Range Status   Specimen Description BLOOD BLOOD RIGHT HAND  Final   Special Requests   Final    BOTTLES DRAWN AEROBIC AND ANAEROBIC Blood Culture adequate volume   Culture  Setup Time   Final    Organism ID to follow AEROBIC BOTTLE ONLY GRAM POSITIVE COCCI Performed at [facility], [address] [place], [place] [address]    Culture [facility] POSITIVE COCCI  Final   Report Status PENDING  Incomplete         Radiology Studies: CT Angio Chest PE W/Cm &/Or Wo Cm  Result Date: 06/16/2023 CLINICAL DATA:  Concern for  point embolism. EXAM: CT ANGIOGRAPHY CHEST WITH CONTRAST TECHNIQUE: Multidetector CT imaging of the chest was performed using the standard protocol during bolus administration of intravenous contrast. Multiplanar CT image reconstructions and MIPs were obtained to evaluate the vascular anatomy. RADIATION DOSE REDUCTION: This exam was performed according to the departmental dose-optimization program which includes automated exposure control, adjustment of the mA and/or kV according to patient size and/or use of iterative reconstruction technique. CONTRAST:  75mL OMNIPAQUE IOHEXOL 350 MG/ML SOLN COMPARISON:  Radiograph dated 06/16/2023. FINDINGS: Cardiovascular: There is no cardiomegaly or pericardial effusion. Mild atherosclerotic calcification of the aortic arch. No aneurysmal dilatation or dissection. The origins of the great vessels of the aortic arch are patent. No pulmonary artery embolus identified. Mediastinum/Nodes: No hilar or mediastinal adenopathy. The esophagus is grossly unremarkable. No mediastinal fluid collection. Lungs/Pleura: There is eventration of the right hemidiaphragm with right lung base atelectasis. The left lung is clear. There is no pneumothorax. The central  airways are patent. Upper Abdomen: No acute abnormality. Musculoskeletal: No acute osseous pathology. Review of the MIP images confirms the above findings. IMPRESSION: 1. No acute intrathoracic pathology. No CT evidence of pulmonary embolism. 2. 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 3.  Aortic Atherosclerosis (ICD10-I70.0). Electronically Signed   By: Elgie Collard M.D.   On: 06/16/2023 19:09   DG Chest Port 1 View  Result Date: 06/16/2023 CLINICAL DATA:  Cough, leukocytosis EXAM: PORTABLE CHEST 1 VIEW COMPARISON:  06/14/2023 FINDINGS: Low lung volumes. Lungs are essentially clear. No pleural effusion or pneumothorax. The heart is normal in size. IMPRESSION: No acute cardiopulmonary disease. Electronically Signed   By: Charline Bills M.D.   On: 06/16/2023 17:18        Scheduled Meds:  atenolol  25 mg Oral Daily   atorvastatin  10 mg Oral Daily   enoxaparin (LOVENOX) injection  40 mg Subcutaneous Q24H   feeding supplement  237 mL Oral BID BM   folic acid  1 mg Oral Daily   LORazepam  0-4 mg Intravenous Q6H   Followed by   Melene Muller ON 06/18/2023] LORazepam  0-4 mg Intravenous Q12H   multivitamin with minerals  1 tablet Oral Daily   pantoprazole (PROTONIX) IV  40 mg Intravenous Q12H   sodium chloride flush  10-40 mL Intracatheter Q12H   tamsulosin  0.4 mg Oral Daily   thiamine  100 mg Oral Daily   Or   thiamine  100 mg Intravenous Daily   Continuous Infusions:  sodium chloride 75 mL/hr at 06/17/23 1412     LOS: 0 days     Silvano Bilis, MD [facility]   If 7PM-7AM, please contact night-coverage [email or web address] Password [email or web address] 06/17/2023, 2:50 PM

## 2023-06-17 NOTE — Plan of Care (Signed)
  Problem: Activity: Goal: Risk for activity intolerance will decrease Outcome: Progressing   Problem: Nutrition: Goal: Adequate nutrition will be maintained Outcome: Progressing   Problem: Coping: Goal: Level of anxiety will decrease Outcome: Progressing   

## 2023-06-18 DIAGNOSIS — R109 Unspecified abdominal pain: Secondary | ICD-10-CM | POA: Diagnosis not present

## 2023-06-18 MED ORDER — PANTOPRAZOLE SODIUM 40 MG PO TBEC
40.0000 mg | DELAYED_RELEASE_TABLET | Freq: Two times a day (BID) | ORAL | Status: DC
  Administered 2023-06-18 – 2023-06-30 (×24): 40 mg via ORAL
  Filled 2023-06-18 (×24): qty 1

## 2023-06-18 MED ORDER — THIAMINE HCL 100 MG/ML IJ SOLN
500.0000 mg | Freq: Three times a day (TID) | INTRAVENOUS | Status: AC
  Administered 2023-06-18 – 2023-06-21 (×9): 500 mg via INTRAVENOUS
  Filled 2023-06-18 (×9): qty 5

## 2023-06-18 NOTE — Progress Notes (Signed)
PROGRESS NOTE    Douglas Peterson.  VWU:[id number] DOB: 09/03/1951 DOA: 06/16/2023 PCP: Melonie Florida, FNP      Brief Narrative:   Past medical history of chronic alcohol use, atrial fibrillation not on anticoagulation, hyponatremia, TBI, who presents to the emergency department with multiple symptoms.   He notes that he is continuing to have abdominal discomfort and nausea and vomiting.  He feels fatigued.  He has shortness of breath.  He has had a cough and feels like his chest is congested.  He denies fever or chest pain.  He denies abdominal pain or GU symptoms.   He continues to drink a significant amount of alcohol and his last drink was just prior to arrival.  He denies drug use.   When I ask him to clarify why he was here in the emergency department despite being evaluated 2 days ago, he notes that his cough is worse and his breathing is worse and all other symptoms are chronic and ongoing and unchanged.   Assessment & Plan:   Principal Problem:   Abdominal pain Active Problems:   Alcohol use disorder, severe, dependence (HCC)   SIRS (systemic inflammatory response syndrome) (HCC)   Elevated lactic acid level   GERD (gastroesophageal reflux disease)   Chronic diastolic CHF (congestive heart failure) (HCC)   Paroxysmal A-fib (HCC)   Dyslipidemia   Essential hypertension   BPH (benign prostatic hyperplasia)   Left inguinal hernia  # Abdominal pain Now resolved. CT abd/pelvis 8/11 negative. Normal labs here. Suspect gastritis from alcohol abuse - continue PPI  # Lactic acidosis 2/2 alcohol intoxication, resolved with fluids. No s/s infection  # Staph epi positive blood culture In one of two bottles, clinically relatively low suspicion for bacteremia - will plan to follow clinically  # Alcohol abuse # Hospital delirium Mild withdrawal symptoms today - continue ciwa lorazepam, vitamins - will order high dose thiamine  # Debility PT advising SNF, patient  initially hesitant but now says he doesn't feel safe going home and requests SNF - TOC consulted, [facility] accepted, pending insurance auth  # A-fib, paroxysmal Rate controlled, not on anticoagulant - continue coreg  # HFpEF Euvolemic  # HTN Controlled - cont home coreg  # BPH - cont home flomax   DVT prophylaxis: lovenox Code Status: full Family Communication: daughter updated telephonically 8/15  Level of care: Med-Surg Status is: inpatient    Consultants:  none  Procedures: none  Antimicrobials:  none    Subjective: Reports mild nausea, is confused   Objective: Vitals:   06/17/23 2037 06/18/23 0425 06/18/23 0431 06/18/23 0930  BP: (!) 149/81  (!) 144/78 (!) 157/82  Pulse: 68  68 64  Resp: 18  18 16   Temp: 97.7 F (36.5 C)  97.8 F (36.6 C) (!) 97.4 F (36.3 C)  TempSrc: Oral  Oral Oral  SpO2: 97%  95% 95%  Weight:  58 kg    Height:        Intake/Output Summary (Last 24 hours) at 06/18/2023 1426 Last data filed at 06/18/2023 1100 Gross per 24 hour  Intake 1407.4 ml  Output 700 ml  Net 707.4 ml   Filed Weights   06/16/23 1327 06/17/23 0407 06/18/23 0425  Weight: 58 kg 59.5 kg 58 kg    Examination:  General exam: Appears calm and comfortable  Respiratory system: Clear to auscultation. Respiratory effort normal. Cardiovascular system: S1 & S2 heard, RRR. No JVD, murmurs, rubs, gallops or clicks. No pedal  edema. Gastrointestinal system: Abdomen is nondistended, soft and nontender. No organomegaly or masses felt. Normal bowel sounds heard. Central nervous system: Alert and orienteded to self and place Extremities: right arm contracted Skin: No rashes, lesions or ulcers Psychiatry: calm, appears depressed, confused    Data Reviewed: I have personally reviewed following labs and imaging studies  CBC: Recent Labs  Lab 06/14/23 1554 06/16/23 1410 06/17/23 0151  WBC 16.2* 18.9* 11.7*  HGB 15.8 16.8 13.7  HCT 46.3 48.2 39.6  MCV  91.0 90.1 92.1  PLT 274 254 190   Basic Metabolic Panel: Recent Labs  Lab 06/14/23 1554 06/16/23 1410 06/17/23 0151  NA 131* 134* 137  K 4.5 3.8 3.8  CL 93* 96* 103  CO2 28 26 24   GLUCOSE 105* 107* 79  BUN 7* 6* 7*  CREATININE 0.57* 0.57* 0.55*  CALCIUM 8.5* 8.2* 7.5*  MG  --  2.0  --   PHOS  --  3.4  --    GFR: Estimated Creatinine Clearance: 68.2 mL/min (A) (by C-G formula based on SCr of 0.55 mg/dL (L)). Liver Function Tests: Recent Labs  Lab 06/14/23 1554 06/16/23 1410 06/17/23 0151  AST 57* 49* 36  ALT 62* 58* 41  ALKPHOS 78 82 64  BILITOT 0.9 0.9 0.9  PROT 7.6 7.4 5.6*  ALBUMIN 3.7 3.9 2.8*   Recent Labs  Lab 06/14/23 1554 06/16/23 1410  LIPASE 35 32   No results for input(s): "AMMONIA" in the last 168 hours. Coagulation Profile: No results for input(s): "INR", "PROTIME" in the last 168 hours. Cardiac Enzymes: No results for input(s): "CKTOTAL", "CKMB", "CKMBINDEX", "TROPONINI" in the last 168 hours. BNP (last 3 results) No results for input(s): "PROBNP" in the last 8760 hours. HbA1C: No results for input(s): "HGBA1C" in the last 72 hours. CBG: No results for input(s): "GLUCAP" in the last 168 hours. Lipid Profile: No results for input(s): "CHOL", "HDL", "LDLCALC", "TRIG", "CHOLHDL", "LDLDIRECT" in the last 72 hours. Thyroid Function Tests: No results for input(s): "TSH", "T4TOTAL", "FREET4", "T3FREE", "THYROIDAB" in the last 72 hours. Anemia Panel: No results for input(s): "VITAMINB12", "FOLATE", "FERRITIN", "TIBC", "IRON", "RETICCTPCT" in the last 72 hours. Urine analysis:    Component Value Date/Time   COLORURINE YELLOW (A) 06/16/2023 1718   APPEARANCEUR CLEAR (A) 06/16/2023 1718   LABSPEC 1.016 06/16/2023 1718   PHURINE 6.0 06/16/2023 1718   GLUCOSEU NEGATIVE 06/16/2023 1718   HGBUR NEGATIVE 06/16/2023 1718   BILIRUBINUR NEGATIVE 06/16/2023 1718   KETONESUR NEGATIVE 06/16/2023 1718   PROTEINUR NEGATIVE 06/16/2023 1718   NITRITE NEGATIVE  06/16/2023 1718   LEUKOCYTESUR NEGATIVE 06/16/2023 1718   Sepsis Labs: @LABRCNTIP (procalcitonin:4,lacticidven:4)  ) Recent Results (from the past 240 hour(s))  Resp panel by RT-PCR (RSV, Flu A&B, Covid) Anterior Nasal Swab     Status: None   Collection Time: 06/16/23  4:40 PM   Specimen: Anterior Nasal Swab  Result Value Ref Range Status   SARS Coronavirus 2 by RT PCR NEGATIVE NEGATIVE Final    Comment: (NOTE) SARS-CoV-2 target nucleic acids are NOT DETECTED.  The SARS-CoV-2 RNA is generally detectable in upper respiratory specimens during the acute phase of infection. The lowest concentration of SARS-CoV-2 viral copies this assay can detect is 138 copies/mL. A negative result does not preclude SARS-Cov-2 infection and should not be used as the sole basis for treatment or other patient management decisions. A negative result may occur with  improper specimen collection/handling, submission of specimen other than nasopharyngeal swab, presence of viral mutation(s) within  the areas targeted by this assay, and inadequate number of viral copies(<138 copies/mL). A negative result must be combined with clinical observations, patient history, and epidemiological information. The expected result is Negative.  Fact Sheet for Patients:  BloggerCourse.com  Fact Sheet for Healthcare Providers:  SeriousBroker.it  This test is no t yet approved or cleared by the [place] FDA and  has been authorized for detection and/or diagnosis of SARS-CoV-2 by FDA under an Emergency Use Authorization (EUA). This EUA will remain  in effect (meaning this test can be used) for the duration of the COVID-19 declaration under Section 564(b)(1) of the Act, 21 U.S.C.section 360bbb-3(b)(1), unless the authorization is terminated  or revoked sooner.       Influenza A by PCR NEGATIVE NEGATIVE Final   Influenza B by PCR NEGATIVE NEGATIVE Final    Comment:  (NOTE) The Xpert Xpress SARS-CoV-2/FLU/RSV plus assay is intended as an aid in the diagnosis of influenza from Nasopharyngeal swab specimens and should not be used as a sole basis for treatment. Nasal washings and aspirates are unacceptable for Xpert Xpress SARS-CoV-2/FLU/RSV testing.  Fact Sheet for Patients: BloggerCourse.com  Fact Sheet for Healthcare Providers: SeriousBroker.it  This test is not yet approved or cleared by the [place] FDA and has been authorized for detection and/or diagnosis of SARS-CoV-2 by FDA under an Emergency Use Authorization (EUA). This EUA will remain in effect (meaning this test can be used) for the duration of the COVID-19 declaration under Section 564(b)(1) of the Act, 21 U.S.C. section 360bbb-3(b)(1), unless the authorization is terminated or revoked.     Resp Syncytial Virus by PCR NEGATIVE NEGATIVE Final    Comment: (NOTE) Fact Sheet for Patients: BloggerCourse.com  Fact Sheet for Healthcare Providers: SeriousBroker.it  This test is not yet approved or cleared by the [place] FDA and has been authorized for detection and/or diagnosis of SARS-CoV-2 by FDA under an Emergency Use Authorization (EUA). This EUA will remain in effect (meaning this test can be used) for the duration of the COVID-19 declaration under Section 564(b)(1) of the Act, 21 U.S.C. section 360bbb-3(b)(1), unless the authorization is terminated or revoked.  Performed at [facility], [address] [place], [place] [address]   Culture, blood (x 2)     Status: None (Preliminary result)   Collection Time: 06/16/23 10:27 PM   Specimen: BLOOD  Result Value Ref Range Status   Specimen Description BLOOD BLOOD LEFT HAND  Final   Special Requests   Final    BOTTLES DRAWN AEROBIC AND ANAEROBIC Blood Culture adequate volume   Culture   Final    NO GROWTH 2  DAYS Performed at [facility], [address], [place], [place] [address]    Report Status PENDING  Incomplete  Culture, blood (x 2)     Status: None (Preliminary result)   Collection Time: 06/16/23 10:34 PM   Specimen: BLOOD  Result Value Ref Range Status   Specimen Description   Final    BLOOD BLOOD RIGHT HAND Performed at [facility], [address] [place], [place] [address]    Special Requests   Final    BOTTLES DRAWN AEROBIC AND ANAEROBIC Blood Culture adequate volume Performed at [facility], [address] [place], [place] [address]    Culture  Setup Time   Final    IN BOTH AEROBIC AND ANAEROBIC BOTTLES GRAM POSITIVE COCCI CRITICAL RESULT CALLED TO, READ BACK BY AND VERIFIED WITH: ALEX CHAPPELL 06/17/23 1516 KLW Performed  at [facility], [address] [place], [place] [address]    Culture GRAM POSITIVE COCCI  Final   Report Status PENDING  Incomplete  Blood Culture ID Panel (Reflexed)     Status: Abnormal   Collection Time: 06/16/23 10:34 PM  Result Value Ref Range Status   Enterococcus faecalis NOT DETECTED NOT DETECTED Final   Enterococcus Faecium NOT DETECTED NOT DETECTED Final   Listeria monocytogenes NOT DETECTED NOT DETECTED Final   Staphylococcus species DETECTED (A) NOT DETECTED Final    Comment: CRITICAL RESULT CALLED TO, READ BACK BY AND VERIFIED WITH: ALEX CHAPPELL 06/17/23 1516 KLW    Staphylococcus aureus (BCID) NOT DETECTED NOT DETECTED Final   Staphylococcus epidermidis DETECTED (A) NOT DETECTED Final    Comment: Methicillin (oxacillin) resistant coagulase negative staphylococcus. Possible blood culture contaminant (unless isolated from more than one blood culture draw or clinical case suggests pathogenicity). No antibiotic treatment is indicated for blood  culture contaminants. CRITICAL RESULT CALLED TO, READ BACK BY AND VERIFIED WITH: ALEX CHAPPELL 06/17/23 1516 KLW    Staphylococcus lugdunensis NOT DETECTED  NOT DETECTED Final   Streptococcus species NOT DETECTED NOT DETECTED Final   Streptococcus agalactiae NOT DETECTED NOT DETECTED Final   Streptococcus pneumoniae NOT DETECTED NOT DETECTED Final   Streptococcus pyogenes NOT DETECTED NOT DETECTED Final   A.calcoaceticus-baumannii NOT DETECTED NOT DETECTED Final   Bacteroides fragilis NOT DETECTED NOT DETECTED Final   Enterobacterales NOT DETECTED NOT DETECTED Final   Enterobacter cloacae complex NOT DETECTED NOT DETECTED Final   Escherichia coli NOT DETECTED NOT DETECTED Final   Klebsiella aerogenes NOT DETECTED NOT DETECTED Final   Klebsiella oxytoca NOT DETECTED NOT DETECTED Final   Klebsiella pneumoniae NOT DETECTED NOT DETECTED Final   Proteus species NOT DETECTED NOT DETECTED Final   Salmonella species NOT DETECTED NOT DETECTED Final   Serratia marcescens NOT DETECTED NOT DETECTED Final   Haemophilus influenzae NOT DETECTED NOT DETECTED Final   Neisseria meningitidis NOT DETECTED NOT DETECTED Final   Pseudomonas aeruginosa NOT DETECTED NOT DETECTED Final   Stenotrophomonas maltophilia NOT DETECTED NOT DETECTED Final   Candida albicans NOT DETECTED NOT DETECTED Final   Candida auris NOT DETECTED NOT DETECTED Final   Candida glabrata NOT DETECTED NOT DETECTED Final   Candida krusei NOT DETECTED NOT DETECTED Final   Candida parapsilosis NOT DETECTED NOT DETECTED Final   Candida tropicalis NOT DETECTED NOT DETECTED Final   Cryptococcus neoformans/gattii NOT DETECTED NOT DETECTED Final   Methicillin resistance mecA/C DETECTED (A) NOT DETECTED Final    Comment: CRITICAL RESULT CALLED TO, READ BACK BY AND VERIFIED WITH: ALEX CHAPPELL 06/17/23 1516 KLW Performed at [facility], [address] [place], [place] [address]          Radiology Studies: CT Angio Chest PE W/Cm &/Or Wo Cm  Result Date: 06/16/2023 CLINICAL DATA:  Concern for point embolism. EXAM: CT ANGIOGRAPHY CHEST WITH CONTRAST TECHNIQUE: Multidetector CT  imaging of the chest was performed using the standard protocol during bolus administration of intravenous contrast. Multiplanar CT image reconstructions and MIPs were obtained to evaluate the vascular anatomy. RADIATION DOSE REDUCTION: This exam was performed according to the departmental dose-optimization program which includes automated exposure control, adjustment of the mA and/or kV according to patient size and/or use of iterative reconstruction technique. CONTRAST:  75mL OMNIPAQUE IOHEXOL 350 MG/ML SOLN COMPARISON:  Radiograph dated 06/16/2023. FINDINGS: Cardiovascular: There is no cardiomegaly or pericardial effusion. Mild atherosclerotic calcification of the aortic arch. No aneurysmal  dilatation or dissection. The origins of the great vessels of the aortic arch are patent. No pulmonary artery embolus identified. Mediastinum/Nodes: No hilar or mediastinal adenopathy. The esophagus is grossly unremarkable. No mediastinal fluid collection. Lungs/Pleura: There is eventration of the right hemidiaphragm with right lung base atelectasis. The left lung is clear. There is no pneumothorax. The central airways are patent. Upper Abdomen: No acute abnormality. Musculoskeletal: No acute osseous pathology. Review of the MIP images confirms the above findings. IMPRESSION: 1. No acute intrathoracic pathology. No CT evidence of pulmonary embolism. 2. 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 3.  Aortic Atherosclerosis (ICD10-I70.0). Electronically Signed   By: Elgie Collard M.D.   On: 06/16/2023 19:09   DG Chest Port 1 View  Result Date: 06/16/2023 CLINICAL DATA:  Cough, leukocytosis EXAM: PORTABLE CHEST 1 VIEW COMPARISON:  06/14/2023 FINDINGS: Low lung volumes. Lungs are essentially clear. No pleural effusion or pneumothorax. The heart is normal in size. IMPRESSION: No acute cardiopulmonary disease. Electronically Signed   By: Charline Bills M.D.   On: 06/16/2023 17:18         Scheduled Meds:  atenolol  25 mg Oral Daily   atorvastatin  10 mg Oral Daily   enoxaparin (LOVENOX) injection  40 mg Subcutaneous Q24H   feeding supplement  237 mL Oral BID BM   folic acid  1 mg Oral Daily   LORazepam  0-4 mg Intravenous Q6H   Followed by   LORazepam  0-4 mg Intravenous Q12H   multivitamin with minerals  1 tablet Oral Daily   pantoprazole  40 mg Oral BID   sodium chloride flush  10-40 mL Intracatheter Q12H   tamsulosin  0.4 mg Oral Daily   thiamine  100 mg Oral Daily   Or   thiamine  100 mg Intravenous Daily   Continuous Infusions:     LOS: 1 day     Silvano Bilis, MD [facility]   If 7PM-7AM, please contact night-coverage [email or web address] Password [email or web address] 06/18/2023, 2:26 PM

## 2023-06-18 NOTE — TOC Progression Note (Signed)
Transition of Care (TOC) - Progression Note    Patient Details  Name: Douglas Peterson. MRN: [id number] Date of Birth: [birth year]  Transition of Care [facility]) CM/SW Contact  [name], RN Phone Number: 06/18/2023, 11:11 AM  Clinical Narrative:   Insurance auth pending for [facility]        Expected Discharge Plan and Services                                               Social Determinants of Health (SDOH) Interventions SDOH Screenings   Food Insecurity: No Food Insecurity (06/16/2023)  Housing: Low Risk  (06/16/2023)  Transportation Needs: No Transportation Needs (06/16/2023)  Utilities: Not At Risk (06/16/2023)  Financial Resource Strain: Unknown (06/13/2019)  Physical Activity: Unknown (06/13/2019)  Social Connections: Unknown (06/13/2019)  Stress: Unknown (06/13/2019)  Tobacco Use: Medium Risk (06/16/2023)    Readmission Risk Interventions    06/17/2023    4:23 PM 04/29/2022   10:26 AM 03/14/2022   12:48 PM  Readmission Risk Prevention Plan  Transportation Screening Complete Complete Complete  PCP or Specialist Appt within 3-5 Days   Complete  HRI or Home Care Consult Complete Complete Complete  Social Work Consult for Recovery Care Planning/Counseling Complete Complete Complete  Palliative Care Screening Not Applicable Not Applicable Not Applicable  Medication Review Oceanographer) Complete Complete Complete

## 2023-06-18 NOTE — Progress Notes (Signed)
Physical Therapy Treatment Patient Details Name: Douglas Peterson. MRN: [id number] DOB: [birth year] Today's Date: 06/18/2023   History of Present Illness 72 y.o. 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     Patient states that he has nausea, vomiting, abdominal pain in the past 3 days.  He has several episodes of nonbilious nonbloody vomiting.  Denies diarrhea.  His abdominal pain is located in central and upper abdomen, intermittent, mild to moderate, aching, nonradiating.  Pt was here for similar 8/11.    PT Comments  Pt was long sitting in bed upon arrival. Pt is well known by author from previous admissions. Pt present with increased lethargy today and required much more assistance to tolerate getting OOB. Pt only ambulated ~ 8 ft today and struggles to stay awake throughout. Author contributes pt's increased lethargy due to medications given previous night (ativan). Acute PT will continue to follow and progress per current POC. Highly recommend continued skilled PT at DC to maximize independence while assisting pt to PLOF.   If plan is discharge home, recommend the following: A lot of help with walking and/or transfers;A lot of help with bathing/dressing/bathroom;Assistance with cooking/housework;Direct supervision/assist for medications management;Direct supervision/assist for financial management;Assist for transportation;Help with stairs or ramp for entrance;Supervision due to cognitive status     Equipment Recommendations  None recommended by PT       Precautions / Restrictions Precautions Precautions: Fall Restrictions Weight Bearing Restrictions: No     Mobility  Bed Mobility Overal bed mobility: Modified  Independent Bed Mobility: Supine to Sit  Supine to sit: Min assist       Transfers Overall transfer level: Needs assistance Equipment used: Straight cane Transfers: Sit to/from Stand Sit to Stand: Min assist   Ambulation/Gait Ambulation/Gait assistance: Min assist, Mod assist Gait Distance (Feet): 8 Feet Assistive device: Straight cane Gait Pattern/deviations: Step-to pattern, Narrow base of support, Staggering left, Staggering right Gait velocity: decreased  General Gait Details: Pt ambulated ~ 8 ft however was extremely unsteady. Several occasions of therapist intervention to prevent fall. Pt seems much more lethargic today. Author contributes to medications given previous night (ativan)   Balance Overall balance assessment: Needs assistance Sitting-balance support: Single extremity supported, Feet supported Sitting balance-Leahy Scale: Good     Standing balance support: Single extremity supported, During functional activity, Reliant on assistive device for balance Standing balance-Leahy Scale: Poor Standing balance comment: pt very unsteady today      Cognition Arousal: Alert Behavior During Therapy: WFL for tasks assessed/performed Overall Cognitive Status: Within Functional Limits for tasks assessed      General Comments: Pt is A and O x 4               Pertinent Vitals/Pain Pain Assessment Pain Assessment: No/denies pain     PT Goals (current goals can now be found in the care plan section) Progress towards PT goals: Progressing toward goals    Frequency    Min 1X/week      PT Plan Current plan remains appropriate       AM-PAC PT "6 Clicks" Mobility   Outcome Measure  Help needed turning from your back to your side while in a flat bed without using bedrails?: None Help needed moving from lying on your back to sitting on  the side of a flat bed without using bedrails?: A Little Help needed moving to and from a bed to a chair (including a  wheelchair)?: A Little Help needed standing up from a chair using your arms (e.g., wheelchair or bedside chair)?: A Little Help needed to walk in hospital room?: A Little Help needed climbing 3-5 steps with a railing? : A Little 6 Click Score: 19    End of Session   Activity Tolerance: Patient limited by lethargy Patient left: in chair;with call bell/phone within reach;with chair alarm set Nurse Communication: Mobility status PT Visit Diagnosis: Unsteadiness on feet (R26.81);Difficulty in walking, not elsewhere classified (R26.2);Muscle weakness (generalized) (M62.81)     Time: 1610-9604 PT Time Calculation (min) (ACUTE ONLY): 14 min  Charges:    $Gait Training: 8-22 mins PT General Charges $$ ACUTE PT VISIT: 1 Visit                     Jetta Lout PTA 06/18/23, 12:42 PM

## 2023-06-18 NOTE — Progress Notes (Signed)
PHARMACIST - PHYSICIAN COMMUNICATION  DR:  Ashok Pall  CONCERNING: IV to Oral Route Change Policy  RECOMMENDATION: This patient is receiving pantoprazole by the intravenous route.  Based on criteria approved by the Pharmacy and Therapeutics Committee, the intravenous medication(s) is/are being converted to the equivalent oral dose form(s).   DESCRIPTION: These criteria include: The patient is eating (either orally or via tube) and/or has been taking other orally administered medications for a least 24 hours The patient has no evidence of active gastrointestinal bleeding or impaired GI absorption (gastrectomy, short bowel, patient on TNA or NPO).  If you have questions about this conversion, please contact the Pharmacy Department  []   [phone] )  [facility] [x]   [phone] )  [facility] []   [phone] )  [facility] []   [phone] )  [facility] []   [phone] )  [facility]     Elliot Gurney, PharmD, BCPS Clinical Pharmacist  06/18/2023 1:16 PM

## 2023-06-18 NOTE — Plan of Care (Signed)
?  Problem: Education: ?Goal: Knowledge of General Education information will improve ?Description: Including pain rating scale, medication(s)/side effects and non-pharmacologic comfort measures ?Outcome: Progressing ?  ?Problem: Health Behavior/Discharge Planning: ?Goal: Ability to manage health-related needs will improve ?Outcome: Progressing ?  ?Problem: Coping: ?Goal: Level of anxiety will decrease ?Outcome: Progressing ?  ?

## 2023-06-18 NOTE — Plan of Care (Signed)
  Problem: Coping: Goal: Level of anxiety will decrease Outcome: Progressing   Problem: Pain Managment: Goal: General experience of comfort will improve Outcome: Progressing   Problem: Safety: Goal: Ability to remain free from injury will improve Outcome: Progressing   

## 2023-06-19 DIAGNOSIS — R109 Unspecified abdominal pain: Secondary | ICD-10-CM | POA: Diagnosis not present

## 2023-06-19 MED ORDER — LORAZEPAM 1 MG PO TABS
1.0000 mg | ORAL_TABLET | ORAL | Status: AC | PRN
  Administered 2023-06-21: 1 mg via ORAL
  Filled 2023-06-19: qty 1

## 2023-06-19 MED ORDER — LORAZEPAM 2 MG/ML IJ SOLN
1.0000 mg | INTRAMUSCULAR | Status: AC | PRN
  Administered 2023-06-20: 2 mg via INTRAVENOUS

## 2023-06-19 NOTE — TOC Progression Note (Signed)
Transition of Care (TOC) - Progression Note    Patient Details  Name: Douglas Peterson. MRN: [id number] Date of Birth: 08-13-1951  Transition of Care [facility]) CM/SW Contact  [name], RN Phone Number: 06/19/2023, 5:02 PM  Clinical Narrative:     Peer to Peer has been offered Has to be completed by Monday at noon [phone]  MD notified        Expected Discharge Plan and Services                                               Social Determinants of Health (SDOH) Interventions SDOH Screenings   Food Insecurity: No Food Insecurity (06/16/2023)  Housing: Low Risk  (06/16/2023)  Transportation Needs: No Transportation Needs (06/16/2023)  Utilities: Not At Risk (06/16/2023)  Financial Resource Strain: Unknown (06/13/2019)  Physical Activity: Unknown (06/13/2019)  Social Connections: Unknown (06/13/2019)  Stress: Unknown (06/13/2019)  Tobacco Use: Medium Risk (06/16/2023)    Readmission Risk Interventions    06/17/2023    4:23 PM 04/29/2022   10:26 AM 03/14/2022   12:48 PM  Readmission Risk Prevention Plan  Transportation Screening Complete Complete Complete  PCP or Specialist Appt within 3-5 Days   Complete  HRI or Home Care Consult Complete Complete Complete  Social Work Consult for Recovery Care Planning/Counseling Complete Complete Complete  Palliative Care Screening Not Applicable Not Applicable Not Applicable  Medication Review Oceanographer) Complete Complete Complete

## 2023-06-19 NOTE — TOC Progression Note (Signed)
Transition of Care (TOC) - Progression Note    Patient Details  Name: Douglas Peterson. MRN: [id number] Date of Birth: [birth year]  Transition of Care [facility]) CM/SW Contact  [name], RN Phone Number: 06/19/2023, 1:02 PM  Clinical Narrative:      insurance auth pending for [facility]         Expected Discharge Plan and Services                                               Social Determinants of Health (SDOH) Interventions SDOH Screenings   Food Insecurity: No Food Insecurity (06/16/2023)  Housing: Low Risk  (06/16/2023)  Transportation Needs: No Transportation Needs (06/16/2023)  Utilities: Not At Risk (06/16/2023)  Financial Resource Strain: Unknown (06/13/2019)  Physical Activity: Unknown (06/13/2019)  Social Connections: Unknown (06/13/2019)  Stress: Unknown (06/13/2019)  Tobacco Use: Medium Risk (06/16/2023)    Readmission Risk Interventions    06/17/2023    4:23 PM 04/29/2022   10:26 AM 03/14/2022   12:48 PM  Readmission Risk Prevention Plan  Transportation Screening Complete Complete Complete  PCP or Specialist Appt within 3-5 Days   Complete  HRI or Home Care Consult Complete Complete Complete  Social Work Consult for Recovery Care Planning/Counseling Complete Complete Complete  Palliative Care Screening Not Applicable Not Applicable Not Applicable  Medication Review Oceanographer) Complete Complete Complete

## 2023-06-19 NOTE — Progress Notes (Addendum)
PROGRESS NOTE    Douglas Peterson.  MVH:[id number] DOB: 1951/08/04 DOA: 06/16/2023 PCP: Melonie Florida, FNP      Brief Narrative:   Past medical history of chronic alcohol use, atrial fibrillation not on anticoagulation, hyponatremia, TBI, who presents to the emergency department with multiple symptoms.   He notes that he is continuing to have abdominal discomfort and nausea and vomiting.  He feels fatigued.  He has shortness of breath.  He has had a cough and feels like his chest is congested.  He denies fever or chest pain.  He denies abdominal pain or GU symptoms.   He continues to drink a significant amount of alcohol and his last drink was just prior to arrival.  He denies drug use.   When I ask him to clarify why he was here in the emergency department despite being evaluated 2 days ago, he notes that his cough is worse and his breathing is worse and all other symptoms are chronic and ongoing and unchanged.   Assessment & Plan:   Principal Problem:   Abdominal pain Active Problems:   Alcohol use disorder, severe, dependence (HCC)   SIRS (systemic inflammatory response syndrome) (HCC)   Elevated lactic acid level   GERD (gastroesophageal reflux disease)   Chronic diastolic CHF (congestive heart failure) (HCC)   Paroxysmal A-fib (HCC)   Dyslipidemia   Essential hypertension   BPH (benign prostatic hyperplasia)   Left inguinal hernia  # Abdominal pain Now resolved. CT abd/pelvis 8/11 negative. Normal labs here. Suspect gastritis from alcohol abuse - continue PPI  # Lactic acidosis 2/2 alcohol intoxication, resolved with fluids. No s/s infection  # Staph epi & hominis positive blood culture In one of two bottles, clinically relatively low suspicion for bacteremia - continue to follow clinically  # Choking episode With breakfast - SLP swallow eval  # Alcohol abuse # Hospital delirium Mild withdrawal symptoms today - continue ciwa lorazepam, vitamins - continue  high dose thiamine  # Debility PT advising SNF, patient initially hesitant but now says he doesn't feel safe going home and requests SNF - TOC consulted, [facility], pending insurance auth - This evening informed needs p2p. Insurer is closed, will need to call them Monday before noonAuth as been denied Peer to Peer has been offered Has to be completed by Monday at noon [phone] SB Member ID  [id number]  # A-fib, paroxysmal Rate controlled, not on anticoagulant - continue coreg  # HFpEF Euvolemic  # HTN Controlled - cont home coreg  # BPH - cont home flomax   DVT prophylaxis: lovenox Code Status: full Family Communication: daughter updated telephonically 8/15  Level of care: Med-Surg Status is: inpatient    Consultants:  none  Procedures: none  Antimicrobials:  none    Subjective: Hungry for lunch  Objective: Vitals:   06/18/23 2004 06/19/23 0432 06/19/23 0827 06/19/23 1200  BP: (!) 180/87 (!) 149/92 (!) 153/97   Pulse: 70 66 68 62  Resp: 18 18 18    Temp: 97.6 F (36.4 C) 98.1 F (36.7 C) 98.3 F (36.8 C)   TempSrc: Oral     SpO2: 96% 95% 95% 97%  Weight:      Height:        Intake/Output Summary (Last 24 hours) at 06/19/2023 1345 Last data filed at 06/19/2023 0348 Gross per 24 hour  Intake 215.69 ml  Output 300 ml  Net -84.31 ml   Filed Weights   06/16/23 1327 06/17/23 0407 06/18/23  0425  Weight: 58 kg 59.5 kg 58 kg    Examination:  General exam: Appears calm and comfortable  Respiratory system: Clear to auscultation. Respiratory effort normal. Cardiovascular system: S1 & S2 heard, RRR. No JVD, murmurs, rubs, gallops or clicks. No pedal edema. Gastrointestinal system: Abdomen is nondistended, soft and nontender. No organomegaly or masses felt. Normal bowel sounds heard. Central nervous system: Alert and orienteded to self and place Extremities: right arm contracted Skin: No rashes, lesions or ulcers Psychiatry: calm,  appears depressed, confused    Data Reviewed: I have personally reviewed following labs and imaging studies  CBC: Recent Labs  Lab 06/14/23 1554 06/16/23 1410 06/17/23 0151  WBC 16.2* 18.9* 11.7*  HGB 15.8 16.8 13.7  HCT 46.3 48.2 39.6  MCV 91.0 90.1 92.1  PLT 274 254 190   Basic Metabolic Panel: Recent Labs  Lab 06/14/23 1554 06/16/23 1410 06/17/23 0151  NA 131* 134* 137  K 4.5 3.8 3.8  CL 93* 96* 103  CO2 28 26 24   GLUCOSE 105* 107* 79  BUN 7* 6* 7*  CREATININE 0.57* 0.57* 0.55*  CALCIUM 8.5* 8.2* 7.5*  MG  --  2.0  --   PHOS  --  3.4  --    GFR: Estimated Creatinine Clearance: 68.2 mL/min (A) (by C-G formula based on SCr of 0.55 mg/dL (L)). Liver Function Tests: Recent Labs  Lab 06/14/23 1554 06/16/23 1410 06/17/23 0151  AST 57* 49* 36  ALT 62* 58* 41  ALKPHOS 78 82 64  BILITOT 0.9 0.9 0.9  PROT 7.6 7.4 5.6*  ALBUMIN 3.7 3.9 2.8*   Recent Labs  Lab 06/14/23 1554 06/16/23 1410  LIPASE 35 32   No results for input(s): "AMMONIA" in the last 168 hours. Coagulation Profile: No results for input(s): "INR", "PROTIME" in the last 168 hours. Cardiac Enzymes: No results for input(s): "CKTOTAL", "CKMB", "CKMBINDEX", "TROPONINI" in the last 168 hours. BNP (last 3 results) No results for input(s): "PROBNP" in the last 8760 hours. HbA1C: No results for input(s): "HGBA1C" in the last 72 hours. CBG: No results for input(s): "GLUCAP" in the last 168 hours. Lipid Profile: No results for input(s): "CHOL", "HDL", "LDLCALC", "TRIG", "CHOLHDL", "LDLDIRECT" in the last 72 hours. Thyroid Function Tests: No results for input(s): "TSH", "T4TOTAL", "FREET4", "T3FREE", "THYROIDAB" in the last 72 hours. Anemia Panel: No results for input(s): "VITAMINB12", "FOLATE", "FERRITIN", "TIBC", "IRON", "RETICCTPCT" in the last 72 hours. Urine analysis:    Component Value Date/Time   COLORURINE YELLOW (A) 06/16/2023 1718   APPEARANCEUR CLEAR (A) 06/16/2023 1718   LABSPEC  1.016 06/16/2023 1718   PHURINE 6.0 06/16/2023 1718   GLUCOSEU NEGATIVE 06/16/2023 1718   HGBUR NEGATIVE 06/16/2023 1718   BILIRUBINUR NEGATIVE 06/16/2023 1718   KETONESUR NEGATIVE 06/16/2023 1718   PROTEINUR NEGATIVE 06/16/2023 1718   NITRITE NEGATIVE 06/16/2023 1718   LEUKOCYTESUR NEGATIVE 06/16/2023 1718   Sepsis Labs: @LABRCNTIP (procalcitonin:4,lacticidven:4)  ) Recent Results (from the past 240 hour(s))  Resp panel by RT-PCR (RSV, Flu A&B, Covid) Anterior Nasal Swab     Status: None   Collection Time: 06/16/23  4:40 PM   Specimen: Anterior Nasal Swab  Result Value Ref Range Status   SARS Coronavirus 2 by RT PCR NEGATIVE NEGATIVE Final    Comment: (NOTE) SARS-CoV-2 target nucleic acids are NOT DETECTED.  The SARS-CoV-2 RNA is generally detectable in upper respiratory specimens during the acute phase of infection. The lowest concentration of SARS-CoV-2 viral copies this assay can detect is 138 copies/mL. A  negative result does not preclude SARS-Cov-2 infection and should not be used as the sole basis for treatment or other patient management decisions. A negative result may occur with  improper specimen collection/handling, submission of specimen other than nasopharyngeal swab, presence of viral mutation(s) within the areas targeted by this assay, and inadequate number of viral copies(<138 copies/mL). A negative result must be combined with clinical observations, patient history, and epidemiological information. The expected result is Negative.  Fact Sheet for Patients:  BloggerCourse.com  Fact Sheet for Healthcare Providers:  SeriousBroker.it  This test is no t yet approved or cleared by the [place] FDA and  has been authorized for detection and/or diagnosis of SARS-CoV-2 by FDA under an Emergency Use Authorization (EUA). This EUA will remain  in effect (meaning this test can be used) for the duration of  the COVID-19 declaration under Section 564(b)(1) of the Act, 21 U.S.C.section 360bbb-3(b)(1), unless the authorization is terminated  or revoked sooner.       Influenza A by PCR NEGATIVE NEGATIVE Final   Influenza B by PCR NEGATIVE NEGATIVE Final    Comment: (NOTE) The Xpert Xpress SARS-CoV-2/FLU/RSV plus assay is intended as an aid in the diagnosis of influenza from Nasopharyngeal swab specimens and should not be used as a sole basis for treatment. Nasal washings and aspirates are unacceptable for Xpert Xpress SARS-CoV-2/FLU/RSV testing.  Fact Sheet for Patients: BloggerCourse.com  Fact Sheet for Healthcare Providers: SeriousBroker.it  This test is not yet approved or cleared by the [place] FDA and has been authorized for detection and/or diagnosis of SARS-CoV-2 by FDA under an Emergency Use Authorization (EUA). This EUA will remain in effect (meaning this test can be used) for the duration of the COVID-19 declaration under Section 564(b)(1) of the Act, 21 U.S.C. section 360bbb-3(b)(1), unless the authorization is terminated or revoked.     Resp Syncytial Virus by PCR NEGATIVE NEGATIVE Final    Comment: (NOTE) Fact Sheet for Patients: BloggerCourse.com  Fact Sheet for Healthcare Providers: SeriousBroker.it  This test is not yet approved or cleared by the [place] FDA and has been authorized for detection and/or diagnosis of SARS-CoV-2 by FDA under an Emergency Use Authorization (EUA). This EUA will remain in effect (meaning this test can be used) for the duration of the COVID-19 declaration under Section 564(b)(1) of the Act, 21 U.S.C. section 360bbb-3(b)(1), unless the authorization is terminated or revoked.  Performed at [facility], [address] [place], [place] [address]   Culture, blood (x 2)     Status: None (Preliminary result)    Collection Time: 06/16/23 10:27 PM   Specimen: BLOOD  Result Value Ref Range Status   Specimen Description BLOOD BLOOD LEFT HAND  Final   Special Requests   Final    BOTTLES DRAWN AEROBIC AND ANAEROBIC Blood Culture adequate volume   Culture   Final    NO GROWTH 3 DAYS Performed at [facility], [address] [place], [place] [address]    Report Status PENDING  Incomplete  Culture, blood (x 2)     Status: Abnormal   Collection Time: 06/16/23 10:34 PM   Specimen: BLOOD  Result Value Ref Range Status   Specimen Description   Final    BLOOD BLOOD RIGHT HAND Performed at [facility], [address], [place], [place] [address]    Special Requests   Final    BOTTLES DRAWN AEROBIC AND ANAEROBIC Blood Culture adequate volume Performed at [facility], [address] [place], [place] [address]    Culture  Setup Time   Final    IN BOTH AEROBIC AND ANAEROBIC BOTTLES GRAM POSITIVE COCCI CRITICAL RESULT CALLED TO, READ BACK BY AND VERIFIED WITH: ALEX CHAPPELL 06/17/23 1516 KLW    Culture (A)  Final    STAPHYLOCOCCUS HOMINIS STAPHYLOCOCCUS EPIDERMIDIS THE SIGNIFICANCE OF ISOLATING THIS ORGANISM FROM A SINGLE SET OF BLOOD CULTURES WHEN MULTIPLE SETS ARE DRAWN IS UNCERTAIN. PLEASE NOTIFY THE MICROBIOLOGY DEPARTMENT WITHIN ONE WEEK IF SPECIATION AND SENSITIVITIES ARE REQUIRED. Performed at [facility], [address], [place], [place] [address]    Report Status 06/19/2023 FINAL  Final  Blood Culture ID Panel (Reflexed)     Status: Abnormal   Collection Time: 06/16/23 10:34 PM  Result Value Ref Range Status   Enterococcus faecalis NOT DETECTED NOT DETECTED Final   Enterococcus Faecium NOT DETECTED NOT DETECTED Final   Listeria monocytogenes NOT DETECTED NOT DETECTED Final   Staphylococcus species DETECTED (A) NOT DETECTED Final    Comment: CRITICAL RESULT CALLED TO, READ BACK BY AND VERIFIED WITH: ALEX CHAPPELL 06/17/23 1516 KLW    Staphylococcus  aureus (BCID) NOT DETECTED NOT DETECTED Final   Staphylococcus epidermidis DETECTED (A) NOT DETECTED Final    Comment: Methicillin (oxacillin) resistant coagulase negative staphylococcus. Possible blood culture contaminant (unless isolated from more than one blood culture draw or clinical case suggests pathogenicity). No antibiotic treatment is indicated for blood  culture contaminants. CRITICAL RESULT CALLED TO, READ BACK BY AND VERIFIED WITH: ALEX CHAPPELL 06/17/23 1516 KLW    Staphylococcus lugdunensis NOT DETECTED NOT DETECTED Final   Streptococcus species NOT DETECTED NOT DETECTED Final   Streptococcus agalactiae NOT DETECTED NOT DETECTED Final   Streptococcus pneumoniae NOT DETECTED NOT DETECTED Final   Streptococcus pyogenes NOT DETECTED NOT DETECTED Final   A.calcoaceticus-baumannii NOT DETECTED NOT DETECTED Final   Bacteroides fragilis NOT DETECTED NOT DETECTED Final   Enterobacterales NOT DETECTED NOT DETECTED Final   Enterobacter cloacae complex NOT DETECTED NOT DETECTED Final   Escherichia coli NOT DETECTED NOT DETECTED Final   Klebsiella aerogenes NOT DETECTED NOT DETECTED Final   Klebsiella oxytoca NOT DETECTED NOT DETECTED Final   Klebsiella pneumoniae NOT DETECTED NOT DETECTED Final   Proteus species NOT DETECTED NOT DETECTED Final   Salmonella species NOT DETECTED NOT DETECTED Final   Serratia marcescens NOT DETECTED NOT DETECTED Final   Haemophilus influenzae NOT DETECTED NOT DETECTED Final   Neisseria meningitidis NOT DETECTED NOT DETECTED Final   Pseudomonas aeruginosa NOT DETECTED NOT DETECTED Final   Stenotrophomonas maltophilia NOT DETECTED NOT DETECTED Final   Candida albicans NOT DETECTED NOT DETECTED Final   Candida auris NOT DETECTED NOT DETECTED Final   Candida glabrata NOT DETECTED NOT DETECTED Final   Candida krusei NOT DETECTED NOT DETECTED Final   Candida parapsilosis NOT DETECTED NOT DETECTED Final   Candida tropicalis NOT DETECTED NOT DETECTED Final    Cryptococcus neoformans/gattii NOT DETECTED NOT DETECTED Final   Methicillin resistance mecA/C DETECTED (A) NOT DETECTED Final    Comment: CRITICAL RESULT CALLED TO, READ BACK BY AND VERIFIED WITH: ALEX CHAPPELL 06/17/23 1516 KLW Performed at [facility], [address], [place], [place] [address]          Radiology Studies: No results found.      Scheduled Meds:  atenolol  25 mg Oral Daily   atorvastatin  10 mg Oral Daily   enoxaparin (LOVENOX) injection  40 mg Subcutaneous Q24H   feeding  supplement  237 mL Oral BID BM   folic acid  1 mg Oral Daily   LORazepam  0-4 mg Intravenous Q12H   multivitamin with minerals  1 tablet Oral Daily   pantoprazole  40 mg Oral BID   sodium chloride flush  10-40 mL Intracatheter Q12H   tamsulosin  0.4 mg Oral Daily   Continuous Infusions:  thiamine (VITAMIN B1) injection 500 mg (06/19/23 0910)      LOS: 2 days     Silvano Bilis, MD [facility]   If 7PM-7AM, please contact night-coverage [email or web address] Password [email or web address] 06/19/2023, 1:45 PM

## 2023-06-19 NOTE — Plan of Care (Signed)
  Problem: Nutrition: Goal: Adequate nutrition will be maintained Outcome: Progressing   Problem: Coping: Goal: Level of anxiety will decrease Outcome: Not Progressing   Problem: Safety: Goal: Ability to remain free from injury will improve Outcome: Progressing   Problem: Skin Integrity: Goal: Risk for impaired skin integrity will decrease Outcome: Progressing

## 2023-06-19 NOTE — Progress Notes (Signed)
SLP Cancellation Note  Patient Details Name: Douglas Peterson. MRN: [id number] DOB: [birth year]   Cancelled treatment:       Reason Eval/Treat Not Completed:  (chart reviewed; consulted w/ MD and Team re: this pt's swallowing status, recommendations.)  Received an order from MD to assess pt's swallowing today. This pt is well-known to ST services. Consultation was had w/ MD re: pt's BASELINE OROPHARYNGEAL PHASE DYSPHAGIA and the recommendations including NPO status post recent MBSS(05/2023).  NOTED PT CONTINUES TO "drink a significant amount of alcohol and his last drink was just prior to arrival", per MD note on 06/19/2023.   Per last Admit/Discharge:  [name] MBSS 05/2023 revealed: "presents w/ Moderate-Severe, Chronic oropharyngeal Dysphagia w/ contributing factors of motor speech and potential cognitive-communication deficits (in setting of prior L CVA w/ R sided weakness, Ongoing ETOH use/abuse) and deconditioning/weakness. Pt is at HIGH risk for aspiration, aspiration pneumonia; negative sequelae from aspiration. Unsure of pt's full level of comprehension of medical status, Dysphagia, and risk for aspiration, aspiration pneumonia.  Laryngeal penetration occurred intermittently w/ thin and Nectar liquids (trace coating along the underside of the epiglottis) AND also occurring was laryngeal Penetration with Aspiration from bolus RESIDUE from the Valleculae and Pyrirform Sinuses BETWEEN trials. Initial Aspiration occurring during this study was Silent w/ a Delayed, Weak cough; pt was insensate to the viewed(appearing) build-up of trace aspirated material in the cervical trachea (viewable).  Reviewed study findings w/ pt using images. Recommend NPO status w/ frequent oral care to reduce oral bacterial load and to provide stimulation of swallowing and oral movements. MD to discuss further w/ pt including pt's GOC moving forward.".   The Conclusion by the MD and pt at Discharge (05/2023): "Per MD notes, pt  is choosing to continue an oral diet at this time and agreed w/ DNR code status. TOC is involved for placement options. No further skilled ST services indicated currently. MD agreed.".   ST services continues to recommend the same -- alternative means of feeding (PEG) w/ consideration of Palliative Care consult for overall GOC. [name] CURRENT Chest CT this admit would indicate potential Aspiration issues: "there is eventration of the right hemidiaphragm with right lung base atelectasis. The left lung is clear.". No further skilled ST services indicated at this time.        Jerilynn Som, MS, CCC-SLP Speech Language Pathologist Rehab Services; [facility] [phone] (ascom) Douglas Peterson 06/19/2023, 2:34 PM

## 2023-06-20 DIAGNOSIS — R109 Unspecified abdominal pain: Secondary | ICD-10-CM | POA: Diagnosis not present

## 2023-06-20 MED ORDER — SODIUM CHLORIDE 0.9 % IV SOLN: INTRAVENOUS | Status: DC | PRN

## 2023-06-20 MED ORDER — POLYETHYLENE GLYCOL 3350 17 G PO PACK
34.0000 g | PACK | Freq: Every day | ORAL | Status: DC
  Administered 2023-06-20 – 2023-06-25 (×5): 34 g via ORAL
  Filled 2023-06-20 (×6): qty 2

## 2023-06-20 MED ORDER — SENNA 8.6 MG PO TABS
1.0000 | ORAL_TABLET | Freq: Every day | ORAL | Status: DC
  Administered 2023-06-20 – 2023-06-24 (×5): 8.6 mg via ORAL
  Filled 2023-06-20 (×5): qty 1

## 2023-06-20 MED ORDER — POLYETHYLENE GLYCOL 3350 17 G PO PACK
17.0000 g | PACK | Freq: Every day | ORAL | Status: DC
Start: 2023-06-20 — End: 2023-06-20

## 2023-06-20 NOTE — TOC Progression Note (Signed)
Transition of Care (TOC) - Progression Note    Patient Details  Name: Douglas Peterson. MRN: [id number] Date of Birth: 1951/04/22  Transition of [facility]) CM/SW Contact  Allena Katz, LCSW Phone Number: 06/20/2023, 12:07 PM  Clinical Narrative:   MD unable to complete P2P as office is closed. Deadline is Monday at 12. MD to try again for Monday when offices reopen.          Expected Discharge Plan and Services                                               Social Determinants of Health (SDOH) Interventions SDOH Screenings   Food Insecurity: No Food Insecurity (06/16/2023)  Housing: Low Risk  (06/16/2023)  Transportation Needs: No Transportation Needs (06/16/2023)  Utilities: Not At Risk (06/16/2023)  Financial Resource Strain: Unknown (06/13/2019)  Physical Activity: Unknown (06/13/2019)  Social Connections: Unknown (06/13/2019)  Stress: Unknown (06/13/2019)  Tobacco Use: Medium Risk (06/16/2023)    Readmission Risk Interventions    06/17/2023    4:23 PM 04/29/2022   10:26 AM 03/14/2022   12:48 PM  Readmission Risk Prevention Plan  Transportation Screening Complete Complete Complete  PCP or Specialist Appt within 3-5 Days   Complete  HRI or Home Care Consult Complete Complete Complete  Social Work Consult for Recovery Care Planning/Counseling Complete Complete Complete  Palliative Care Screening Not Applicable Not Applicable Not Applicable  Medication Review Oceanographer) Complete Complete Complete

## 2023-06-20 NOTE — Progress Notes (Signed)
Mobility Specialist - Progress Note   06/20/23 0904  Mobility  Activity Ambulated with assistance in room;Stood at bedside;Transferred from bed to chair  Level of Assistance Contact guard assist, steadying assist  Assistive Device Cane  Distance Ambulated (ft) 20 ft  Activity Response Tolerated well  Mobility Referral Yes  $Mobility charge 1 Mobility  Mobility Specialist Start Time (ACUTE ONLY) [id number]  Mobility Specialist Stop Time (ACUTE ONLY) 0849  Mobility Specialist Time Calculation (min) (ACUTE ONLY) 23 min   Pt supine in bed on RA upon arrival. Pt completes bed mobility indep with extra time and bed features. Pt STS and ambulates in room CGA with no LOB noted but general unsteadiness throughout. Pt left in recliner with needs in reach and chair alarm activated.   Terrilyn Saver  Mobility Specialist  06/20/23 9:05 AM

## 2023-06-20 NOTE — Plan of Care (Signed)
  Problem: Activity: Goal: Risk for activity intolerance will decrease Outcome: Progressing   Problem: Nutrition: Goal: Adequate nutrition will be maintained Outcome: Progressing   Problem: Safety: Goal: Ability to remain free from injury will improve Outcome: Progressing   

## 2023-06-20 NOTE — Progress Notes (Signed)
PROGRESS NOTE    Douglas Peterson.  GUY:[id number] DOB: 09-Jan-1951 DOA: 06/16/2023 PCP: Melonie Florida, FNP      Brief Narrative:   Past medical history of chronic alcohol use, atrial fibrillation not on anticoagulation, hyponatremia, TBI, who presents to the emergency department with multiple symptoms.   He notes that he is continuing to have abdominal discomfort and nausea and vomiting.  He feels fatigued.  He has shortness of breath.  He has had a cough and feels like his chest is congested.  He denies fever or chest pain.  He denies abdominal pain or GU symptoms.   He continues to drink a significant amount of alcohol and his last drink was just prior to arrival.  He denies drug use.   When I ask him to clarify why he was here in the emergency department despite being evaluated 2 days ago, he notes that his cough is worse and his breathing is worse and all other symptoms are chronic and ongoing and unchanged.   Assessment & Plan:   Principal Problem:   Abdominal pain Active Problems:   Alcohol use disorder, severe, dependence (HCC)   SIRS (systemic inflammatory response syndrome) (HCC)   Elevated lactic acid level   GERD (gastroesophageal reflux disease)   Chronic diastolic CHF (congestive heart failure) (HCC)   Paroxysmal A-fib (HCC)   Dyslipidemia   Essential hypertension   BPH (benign prostatic hyperplasia)   Left inguinal hernia  # Abdominal pain Now resolved. CT abd/pelvis 8/11 negative. Normal labs here. Suspect gastritis from alcohol abuse - continue PPI  # Lactic acidosis 2/2 alcohol intoxication, resolved with fluids. No s/s infection  # Staph epi & hominis positive blood culture In one of two bottles, clinically relatively low suspicion for bacteremia - continue to follow clinically  # Dysphagia Severe, SLP advising NPO and feeding tube but patient previously declined, aware of risks - dysphagia diet  # Alcohol abuse # Hospital delirium No withdrawal  symptoms today - continue ciwa lorazepam, vitamins - continue high dose thiamine  # Debility PT advising SNF, patient initially hesitant but now says he doesn't feel safe going home and requests SNF - TOC consulted, [facility] accepted, pending insurance auth - This evening informed needs p2p. Insurer is closed, will need to call them Monday before noonAuth as been denied Peer to Peer has been offered Has to be completed by Monday at noon [phone] SB Member ID  [id number]  # A-fib, paroxysmal Rate controlled, not on anticoagulant - continue coreg  # HFpEF Euvolemic  # HTN Controlled - cont home coreg  # BPH - cont home flomax   DVT prophylaxis: lovenox Code Status: full Family Communication: daughter updated telephonically 8/15  Level of care: Med-Surg Status is: inpatient    Consultants:  none  Procedures: none  Antimicrobials:  none    Subjective: Reports some nausea, no vomiting, no pain, no diarrhea  Objective: Vitals:   06/19/23 0827 06/19/23 1200 06/19/23 1929 06/20/23 0632  BP: (!) 153/97  127/79 133/78  Pulse: 68 62 70 65  Resp: 18  16 20   Temp: 98.3 F (36.8 C)  98.5 F (36.9 C) (!) 97.5 F (36.4 C)  TempSrc:   Oral Oral  SpO2: 95% 97% 94% 95%  Weight:      Height:        Intake/Output Summary (Last 24 hours) at 06/20/2023 1144 Last data filed at 06/20/2023 1046 Gross per 24 hour  Intake 390 ml  Output 300 ml  Net 90 ml   Filed Weights   06/16/23 1327 06/17/23 0407 06/18/23 0425  Weight: 58 kg 59.5 kg 58 kg    Examination:  General exam: Appears calm and comfortable  Respiratory system: Clear to auscultation. Respiratory effort normal. Cardiovascular system: S1 & S2 heard, RRR. No JVD, murmurs, rubs, gallops or clicks. No pedal edema. Gastrointestinal system: Abdomen is nondistended, soft and nontender. No organomegaly or masses felt. Normal bowel sounds heard. Central nervous system: Alert and orienteded to self and  place Extremities: right arm contracted Skin: No rashes, lesions or ulcers Psychiatry: calm, appears depressed, confused    Data Reviewed: I have personally reviewed following labs and imaging studies  CBC: Recent Labs  Lab 06/14/23 1554 06/16/23 1410 06/17/23 0151  WBC 16.2* 18.9* 11.7*  HGB 15.8 16.8 13.7  HCT 46.3 48.2 39.6  MCV 91.0 90.1 92.1  PLT 274 254 190   Basic Metabolic Panel: Recent Labs  Lab 06/14/23 1554 06/16/23 1410 06/17/23 0151  NA 131* 134* 137  K 4.5 3.8 3.8  CL 93* 96* 103  CO2 28 26 24   GLUCOSE 105* 107* 79  BUN 7* 6* 7*  CREATININE 0.57* 0.57* 0.55*  CALCIUM 8.5* 8.2* 7.5*  MG  --  2.0  --   PHOS  --  3.4  --    GFR: Estimated Creatinine Clearance: 68.2 mL/min (A) (by C-G formula based on SCr of 0.55 mg/dL (L)). Liver Function Tests: Recent Labs  Lab 06/14/23 1554 06/16/23 1410 06/17/23 0151  AST 57* 49* 36  ALT 62* 58* 41  ALKPHOS 78 82 64  BILITOT 0.9 0.9 0.9  PROT 7.6 7.4 5.6*  ALBUMIN 3.7 3.9 2.8*   Recent Labs  Lab 06/14/23 1554 06/16/23 1410  LIPASE 35 32   No results for input(s): "AMMONIA" in the last 168 hours. Coagulation Profile: No results for input(s): "INR", "PROTIME" in the last 168 hours. Cardiac Enzymes: No results for input(s): "CKTOTAL", "CKMB", "CKMBINDEX", "TROPONINI" in the last 168 hours. BNP (last 3 results) No results for input(s): "PROBNP" in the last 8760 hours. HbA1C: No results for input(s): "HGBA1C" in the last 72 hours. CBG: No results for input(s): "GLUCAP" in the last 168 hours. Lipid Profile: No results for input(s): "CHOL", "HDL", "LDLCALC", "TRIG", "CHOLHDL", "LDLDIRECT" in the last 72 hours. Thyroid Function Tests: No results for input(s): "TSH", "T4TOTAL", "FREET4", "T3FREE", "THYROIDAB" in the last 72 hours. Anemia Panel: No results for input(s): "VITAMINB12", "FOLATE", "FERRITIN", "TIBC", "IRON", "RETICCTPCT" in the last 72 hours. Urine analysis:    Component Value Date/Time    COLORURINE YELLOW (A) 06/16/2023 1718   APPEARANCEUR CLEAR (A) 06/16/2023 1718   LABSPEC 1.016 06/16/2023 1718   PHURINE 6.0 06/16/2023 1718   GLUCOSEU NEGATIVE 06/16/2023 1718   HGBUR NEGATIVE 06/16/2023 1718   BILIRUBINUR NEGATIVE 06/16/2023 1718   KETONESUR NEGATIVE 06/16/2023 1718   PROTEINUR NEGATIVE 06/16/2023 1718   NITRITE NEGATIVE 06/16/2023 1718   LEUKOCYTESUR NEGATIVE 06/16/2023 1718   Sepsis Labs: @LABRCNTIP (procalcitonin:4,lacticidven:4)  ) Recent Results (from the past 240 hour(s))  Resp panel by RT-PCR (RSV, Flu A&B, Covid) Anterior Nasal Swab     Status: None   Collection Time: 06/16/23  4:40 PM   Specimen: Anterior Nasal Swab  Result Value Ref Range Status   SARS Coronavirus 2 by RT PCR NEGATIVE NEGATIVE Final    Comment: (NOTE) SARS-CoV-2 target nucleic acids are NOT DETECTED.  The SARS-CoV-2 RNA is generally detectable in upper respiratory specimens during the acute phase of infection. The  lowest concentration of SARS-CoV-2 viral copies this assay can detect is 138 copies/mL. A negative result does not preclude SARS-Cov-2 infection and should not be used as the sole basis for treatment or other patient management decisions. A negative result may occur with  improper specimen collection/handling, submission of specimen other than nasopharyngeal swab, presence of viral mutation(s) within the areas targeted by this assay, and inadequate number of viral copies(<138 copies/mL). A negative result must be combined with clinical observations, patient history, and epidemiological information. The expected result is Negative.  Fact Sheet for Patients:  BloggerCourse.com  Fact Sheet for Healthcare Providers:  SeriousBroker.it  This test is no t yet approved or cleared by the [place] FDA and  has been authorized for detection and/or diagnosis of SARS-CoV-2 by FDA under an Emergency Use Authorization (EUA).  This EUA will remain  in effect (meaning this test can be used) for the duration of the COVID-19 declaration under Section 564(b)(1) of the Act, 21 U.S.C.section 360bbb-3(b)(1), unless the authorization is terminated  or revoked sooner.       Influenza A by PCR NEGATIVE NEGATIVE Final   Influenza B by PCR NEGATIVE NEGATIVE Final    Comment: (NOTE) The Xpert Xpress SARS-CoV-2/FLU/RSV plus assay is intended as an aid in the diagnosis of influenza from Nasopharyngeal swab specimens and should not be used as a sole basis for treatment. Nasal washings and aspirates are unacceptable for Xpert Xpress SARS-CoV-2/FLU/RSV testing.  Fact Sheet for Patients: BloggerCourse.com  Fact Sheet for Healthcare Providers: SeriousBroker.it  This test is not yet approved or cleared by the [place] FDA and has been authorized for detection and/or diagnosis of SARS-CoV-2 by FDA under an Emergency Use Authorization (EUA). This EUA will remain in effect (meaning this test can be used) for the duration of the COVID-19 declaration under Section 564(b)(1) of the Act, 21 U.S.C. section 360bbb-3(b)(1), unless the authorization is terminated or revoked.     Resp Syncytial Virus by PCR NEGATIVE NEGATIVE Final    Comment: (NOTE) Fact Sheet for Patients: BloggerCourse.com  Fact Sheet for Healthcare Providers: SeriousBroker.it  This test is not yet approved or cleared by the [place] FDA and has been authorized for detection and/or diagnosis of SARS-CoV-2 by FDA under an Emergency Use Authorization (EUA). This EUA will remain in effect (meaning this test can be used) for the duration of the COVID-19 declaration under Section 564(b)(1) of the Act, 21 U.S.C. section 360bbb-3(b)(1), unless the authorization is terminated or revoked.  Performed at [facility], [address]  [place], [place] [address]   Culture, blood (x 2)     Status: None (Preliminary result)   Collection Time: 06/16/23 10:27 PM   Specimen: BLOOD  Result Value Ref Range Status   Specimen Description BLOOD BLOOD LEFT HAND  Final   Special Requests   Final    BOTTLES DRAWN AEROBIC AND ANAEROBIC Blood Culture adequate volume   Culture   Final    NO GROWTH 4 DAYS Performed at [facility], [address] [place], [place] [address]    Report Status PENDING  Incomplete  Culture, blood (x 2)     Status: Abnormal   Collection Time: 06/16/23 10:34 PM   Specimen: BLOOD  Result Value Ref Range Status   Specimen Description   Final    BLOOD BLOOD RIGHT HAND Performed at [facility], [address], [place], [place] [address]    Special Requests   Final    BOTTLES DRAWN  AEROBIC AND ANAEROBIC Blood Culture adequate volume Performed at [facility], [address] [place], [place] [address]    Culture  Setup Time   Final    IN BOTH AEROBIC AND ANAEROBIC BOTTLES GRAM POSITIVE COCCI CRITICAL RESULT CALLED TO, READ BACK BY AND VERIFIED WITH: ALEX CHAPPELL 06/17/23 1516 KLW    Culture (A)  Final    STAPHYLOCOCCUS HOMINIS STAPHYLOCOCCUS EPIDERMIDIS THE SIGNIFICANCE OF ISOLATING THIS ORGANISM FROM A SINGLE SET OF BLOOD CULTURES WHEN MULTIPLE SETS ARE DRAWN IS UNCERTAIN. PLEASE NOTIFY THE MICROBIOLOGY DEPARTMENT WITHIN ONE WEEK IF SPECIATION AND SENSITIVITIES ARE REQUIRED. Performed at [facility], [address] [place], [place] [address]    Report Status 06/19/2023 FINAL  Final  Blood Culture ID Panel (Reflexed)     Status: Abnormal   Collection Time: 06/16/23 10:34 PM  Result Value Ref Range Status   Enterococcus faecalis NOT DETECTED NOT DETECTED Final   Enterococcus Faecium NOT DETECTED NOT DETECTED Final   Listeria monocytogenes NOT DETECTED NOT DETECTED Final   Staphylococcus species DETECTED (A) NOT DETECTED Final    Comment: CRITICAL RESULT CALLED TO,  READ BACK BY AND VERIFIED WITH: ALEX CHAPPELL 06/17/23 1516 KLW    Staphylococcus aureus (BCID) NOT DETECTED NOT DETECTED Final   Staphylococcus epidermidis DETECTED (A) NOT DETECTED Final    Comment: Methicillin (oxacillin) resistant coagulase negative staphylococcus. Possible blood culture contaminant (unless isolated from more than one blood culture draw or clinical case suggests pathogenicity). No antibiotic treatment is indicated for blood  culture contaminants. CRITICAL RESULT CALLED TO, READ BACK BY AND VERIFIED WITH: ALEX CHAPPELL 06/17/23 1516 KLW    Staphylococcus lugdunensis NOT DETECTED NOT DETECTED Final   Streptococcus species NOT DETECTED NOT DETECTED Final   Streptococcus agalactiae NOT DETECTED NOT DETECTED Final   Streptococcus pneumoniae NOT DETECTED NOT DETECTED Final   Streptococcus pyogenes NOT DETECTED NOT DETECTED Final   A.calcoaceticus-baumannii NOT DETECTED NOT DETECTED Final   Bacteroides fragilis NOT DETECTED NOT DETECTED Final   Enterobacterales NOT DETECTED NOT DETECTED Final   Enterobacter cloacae complex NOT DETECTED NOT DETECTED Final   Escherichia coli NOT DETECTED NOT DETECTED Final   Klebsiella aerogenes NOT DETECTED NOT DETECTED Final   Klebsiella oxytoca NOT DETECTED NOT DETECTED Final   Klebsiella pneumoniae NOT DETECTED NOT DETECTED Final   Proteus species NOT DETECTED NOT DETECTED Final   Salmonella species NOT DETECTED NOT DETECTED Final   Serratia marcescens NOT DETECTED NOT DETECTED Final   Haemophilus influenzae NOT DETECTED NOT DETECTED Final   Neisseria meningitidis NOT DETECTED NOT DETECTED Final   Pseudomonas aeruginosa NOT DETECTED NOT DETECTED Final   Stenotrophomonas maltophilia NOT DETECTED NOT DETECTED Final   Candida albicans NOT DETECTED NOT DETECTED Final   Candida auris NOT DETECTED NOT DETECTED Final   Candida glabrata NOT DETECTED NOT DETECTED Final   Candida krusei NOT DETECTED NOT DETECTED Final   Candida parapsilosis NOT  DETECTED NOT DETECTED Final   Candida tropicalis NOT DETECTED NOT DETECTED Final   Cryptococcus neoformans/gattii NOT DETECTED NOT DETECTED Final   Methicillin resistance mecA/C DETECTED (A) NOT DETECTED Final    Comment: CRITICAL RESULT CALLED TO, READ BACK BY AND VERIFIED WITH: ALEX CHAPPELL 06/17/23 1516 KLW Performed at [facility], [address], [place], [place] [address]          Radiology Studies: No results found.      Scheduled Meds:  atenolol  25 mg Oral Daily   atorvastatin  10 mg Oral  Daily   enoxaparin (LOVENOX) injection  40 mg Subcutaneous Q24H   feeding supplement  237 mL Oral BID BM   folic acid  1 mg Oral Daily   LORazepam  0-4 mg Intravenous Q12H   multivitamin with minerals  1 tablet Oral Daily   pantoprazole  40 mg Oral BID   sodium chloride flush  10-40 mL Intracatheter Q12H   tamsulosin  0.4 mg Oral Daily   Continuous Infusions:  sodium chloride 10 mL/hr at 06/20/23 1110   thiamine (VITAMIN B1) injection 500 mg (06/20/23 1011)      LOS: 3 days     Silvano Bilis, MD [facility]   If 7PM-7AM, please contact night-coverage [email or web address] Password [email or web address] 06/20/2023, 11:44 AM

## 2023-06-21 DIAGNOSIS — R109 Unspecified abdominal pain: Secondary | ICD-10-CM | POA: Diagnosis not present

## 2023-06-21 LAB — COMPREHENSIVE METABOLIC PANEL
ALT: 58 U/L — ABNORMAL HIGH (ref 0–44)
AST: 49 U/L — ABNORMAL HIGH (ref 15–41)
Albumin: 3.5 g/dL (ref 3.5–5.0)
Alkaline Phosphatase: 83 U/L (ref 38–126)
Anion gap: 9 (ref 5–15)
BUN: 12 mg/dL (ref 8–23)
CO2: 24 mmol/L (ref 22–32)
Calcium: 8.7 mg/dL — ABNORMAL LOW (ref 8.9–10.3)
Chloride: 101 mmol/L (ref 98–111)
Creatinine, Ser: 0.57 mg/dL — ABNORMAL LOW (ref 0.61–1.24)
GFR, Estimated: >60 mL/min (ref >60)
Glucose, Bld: 107 mg/dL — ABNORMAL HIGH (ref 70–99)
Potassium: 3.6 mmol/L (ref 3.5–5.1)
Sodium: 134 mmol/L — ABNORMAL LOW (ref 135–145)
Total Bilirubin: 0.9 mg/dL (ref 0.3–1.2)
Total Protein: 6.6 g/dL (ref 6.5–8.1)

## 2023-06-21 LAB — CBC
HCT: 42.1 % (ref 39.0–52.0)
Hemoglobin: 14.7 g/dL (ref 13.0–17.0)
MCH: 31.3 pg (ref 26.0–34.0)
MCHC: 34.9 g/dL (ref 30.0–36.0)
MCV: 89.6 fL (ref 80.0–100.0)
Platelets: 136 10*3/uL — ABNORMAL LOW (ref 150–400)
RBC: 4.7 MIL/uL (ref 4.22–5.81)
RDW: 11.6 % (ref 11.5–15.5)
WBC: 6.3 10*3/uL (ref 4.0–10.5)
nRBC: 0 % (ref 0.0–0.2)

## 2023-06-21 MED ORDER — CHLORHEXIDINE GLUCONATE CLOTH 2 % EX PADS
6.0000 | MEDICATED_PAD | Freq: Every day | CUTANEOUS | Status: DC
  Administered 2023-06-21: 6 via TOPICAL

## 2023-06-21 MED ORDER — MELATONIN 5 MG PO TABS
5.0000 mg | ORAL_TABLET | Freq: Every day | ORAL | Status: DC
  Administered 2023-06-21 – 2023-06-29 (×9): 5 mg via ORAL
  Filled 2023-06-21 (×9): qty 1

## 2023-06-21 NOTE — Progress Notes (Signed)
Mobility Specialist - Progress Note   06/21/23 0924  Mobility  Activity Ambulated with assistance in hallway;Stood at bedside;Dangled on edge of bed  Level of Assistance Contact guard assist, steadying assist  Assistive Device Cane  Distance Ambulated (ft) 180 ft  Activity Response Tolerated well  Mobility Referral Yes  $Mobility charge 1 Mobility  Mobility Specialist Start Time (ACUTE ONLY) 0900  Mobility Specialist Stop Time (ACUTE ONLY) 0916  Mobility Specialist Time Calculation (min) (ACUTE ONLY) 16 min   Pt supine in bed on RA upon arrival. Pt STS and ambulates in hallway CGA with no LOB noted but generally unsteady throughout. Pt returns to bed and able to reposition indep with extra time. Pt left in bed with needs in reach and bed alarm activated.   Terrilyn Saver  Mobility Specialist  06/21/23 9:26 AM

## 2023-06-21 NOTE — Progress Notes (Signed)
PROGRESS NOTE    Douglas Peterson.  OZH:[id number] DOB: 02/25/1951 DOA: 06/16/2023 PCP: Melonie Florida, FNP      Brief Narrative:   Past medical history of chronic alcohol use, atrial fibrillation not on anticoagulation, hyponatremia, TBI, who presents to the emergency department with multiple symptoms.   He notes that he is continuing to have abdominal discomfort and nausea and vomiting.  He feels fatigued.  He has shortness of breath.  He has had a cough and feels like his chest is congested.  He denies fever or chest pain.  He denies abdominal pain or GU symptoms.   He continues to drink a significant amount of alcohol and his last drink was just prior to arrival.  He denies drug use.   When I ask him to clarify why he was here in the emergency department despite being evaluated 2 days ago, he notes that his cough is worse and his breathing is worse and all other symptoms are chronic and ongoing and unchanged.   Assessment & Plan:   Principal Problem:   Abdominal pain Active Problems:   Alcohol use disorder, severe, dependence (HCC)   SIRS (systemic inflammatory response syndrome) (HCC)   Elevated lactic acid level   GERD (gastroesophageal reflux disease)   Chronic diastolic CHF (congestive heart failure) (HCC)   Paroxysmal A-fib (HCC)   Dyslipidemia   Essential hypertension   BPH (benign prostatic hyperplasia)   Left inguinal hernia  # Abdominal pain Resolved. CT abd/pelvis 8/11 negative. Normal labs here. Suspect gastritis from alcohol abuse - continue PPI  # Lactic acidosis 2/2 alcohol intoxication, resolved with fluids. No s/s infection  # Staph epi & hominis positive blood culture In one of two bottles, clinically relatively low suspicion for bacteremia - continue to follow clinically  # Dysphagia Severe, SLP advising NPO and feeding tube but patient previously declined, aware of risks - dysphagia diet  # Alcohol abuse # Hospital delirium No withdrawal  symptoms today. improving - continue ciwa lorazepam, vitamins - continue high dose thiamine  # Debility PT advising SNF, patient initially hesitant but now says he doesn't feel safe going home and requests SNF - TOC consulted, [facility] accepted, pending insurance auth - This evening informed needs p2p. Insurer is closed, will need to call them Monday before noonAuth as been denied Peer to Peer has been offered Has to be completed by Monday at noon [phone] SB Member ID  [id number]  # A-fib, paroxysmal Rate controlled, not on anticoagulant - continue coreg  # HFpEF Euvolemic  # HTN Controlled - cont home coreg  # BPH - cont home flomax   DVT prophylaxis: lovenox Code Status: full Family Communication: daughter updated telephonically 8/15  Level of care: Med-Surg Status is: inpatient    Consultants:  none  Procedures: none  Antimicrobials:  none    Subjective: Reports some nausea, no vomiting, no pain, had bm yesterday  Objective: Vitals:   06/21/23 0447 06/21/23 0600 06/21/23 0602 06/21/23 0906  BP: (!) 167/87 (!) 141/96 (!) 141/96 (!) 154/81  Pulse: 76 76 76 84  Resp: 18   18  Temp: 97.7 F (36.5 C)   97.9 F (36.6 C)  TempSrc: Oral     SpO2: 95%   100%  Weight:      Height:        Intake/Output Summary (Last 24 hours) at 06/21/2023 1518 Last data filed at 06/21/2023 1457 Gross per 24 hour  Intake 1246.92 ml  Output 1150 ml  Net 96.92 ml   Filed Weights   06/17/23 0407 06/18/23 0425 06/21/23 0447  Weight: 59.5 kg 58 kg 56.8 kg    Examination:  General exam: Appears calm and comfortable  Respiratory system: Clear to auscultation. Respiratory effort normal. Cardiovascular system: S1 & S2 heard, RRR. No JVD, murmurs, rubs, gallops or clicks. No pedal edema. Gastrointestinal system: Abdomen is nondistended, soft and nontender. No organomegaly or masses felt. Normal bowel sounds heard. Central nervous system: Alert   Extremities:  right arm contracted Skin: No rashes, lesions or ulcers Psychiatry: calm, appears depressed,     Data Reviewed: I have personally reviewed following labs and imaging studies  CBC: Recent Labs  Lab 06/14/23 1554 06/16/23 1410 06/17/23 0151 06/21/23 0347  WBC 16.2* 18.9* 11.7* 6.3  HGB 15.8 16.8 13.7 14.7  HCT 46.3 48.2 39.6 42.1  MCV 91.0 90.1 92.1 89.6  PLT 274 254 190 136*   Basic Metabolic Panel: Recent Labs  Lab 06/14/23 1554 06/16/23 1410 06/17/23 0151 06/21/23 0347  NA 131* 134* 137 134*  K 4.5 3.8 3.8 3.6  CL 93* 96* 103 101  CO2 28 26 24 24   GLUCOSE 105* 107* 79 107*  BUN 7* 6* 7* 12  CREATININE 0.57* 0.57* 0.55* 0.57*  CALCIUM 8.5* 8.2* 7.5* 8.7*  MG  --  2.0  --   --   PHOS  --  3.4  --   --    GFR: Estimated Creatinine Clearance: 67.1 mL/min (A) (by C-G formula based on SCr of 0.57 mg/dL (L)). Liver Function Tests: Recent Labs  Lab 06/14/23 1554 06/16/23 1410 06/17/23 0151 06/21/23 0347  AST 57* 49* 36 49*  ALT 62* 58* 41 58*  ALKPHOS 78 82 64 83  BILITOT 0.9 0.9 0.9 0.9  PROT 7.6 7.4 5.6* 6.6  ALBUMIN 3.7 3.9 2.8* 3.5   Recent Labs  Lab 06/14/23 1554 06/16/23 1410  LIPASE 35 32   No results for input(s): "AMMONIA" in the last 168 hours. Coagulation Profile: No results for input(s): "INR", "PROTIME" in the last 168 hours. Cardiac Enzymes: No results for input(s): "CKTOTAL", "CKMB", "CKMBINDEX", "TROPONINI" in the last 168 hours. BNP (last 3 results) No results for input(s): "PROBNP" in the last 8760 hours. HbA1C: No results for input(s): "HGBA1C" in the last 72 hours. CBG: No results for input(s): "GLUCAP" in the last 168 hours. Lipid Profile: No results for input(s): "CHOL", "HDL", "LDLCALC", "TRIG", "CHOLHDL", "LDLDIRECT" in the last 72 hours. Thyroid Function Tests: No results for input(s): "TSH", "T4TOTAL", "FREET4", "T3FREE", "THYROIDAB" in the last 72 hours. Anemia Panel: No results for input(s): "VITAMINB12", "FOLATE",  "FERRITIN", "TIBC", "IRON", "RETICCTPCT" in the last 72 hours. Urine analysis:    Component Value Date/Time   COLORURINE YELLOW (A) 06/16/2023 1718   APPEARANCEUR CLEAR (A) 06/16/2023 1718   LABSPEC 1.016 06/16/2023 1718   PHURINE 6.0 06/16/2023 1718   GLUCOSEU NEGATIVE 06/16/2023 1718   HGBUR NEGATIVE 06/16/2023 1718   BILIRUBINUR NEGATIVE 06/16/2023 1718   KETONESUR NEGATIVE 06/16/2023 1718   PROTEINUR NEGATIVE 06/16/2023 1718   NITRITE NEGATIVE 06/16/2023 1718   LEUKOCYTESUR NEGATIVE 06/16/2023 1718   Sepsis Labs: @LABRCNTIP (procalcitonin:4,lacticidven:4)  ) Recent Results (from the past 240 hour(s))  Resp panel by RT-PCR (RSV, Flu A&B, Covid) Anterior Nasal Swab     Status: None   Collection Time: 06/16/23  4:40 PM   Specimen: Anterior Nasal Swab  Result Value Ref Range Status   SARS Coronavirus 2 by RT PCR NEGATIVE NEGATIVE Final  Comment: (NOTE) SARS-CoV-2 target nucleic acids are NOT DETECTED.  The SARS-CoV-2 RNA is generally detectable in upper respiratory specimens during the acute phase of infection. The lowest concentration of SARS-CoV-2 viral copies this assay can detect is 138 copies/mL. A negative result does not preclude SARS-Cov-2 infection and should not be used as the sole basis for treatment or other patient management decisions. A negative result may occur with  improper specimen collection/handling, submission of specimen other than nasopharyngeal swab, presence of viral mutation(s) within the areas targeted by this assay, and inadequate number of viral copies(<138 copies/mL). A negative result must be combined with clinical observations, patient history, and epidemiological information. The expected result is Negative.  Fact Sheet for Patients:  BloggerCourse.com  Fact Sheet for Healthcare Providers:  SeriousBroker.it  This test is no t yet approved or cleared by the [place] FDA and  has  been authorized for detection and/or diagnosis of SARS-CoV-2 by FDA under an Emergency Use Authorization (EUA). This EUA will remain  in effect (meaning this test can be used) for the duration of the COVID-19 declaration under Section 564(b)(1) of the Act, 21 U.S.C.section 360bbb-3(b)(1), unless the authorization is terminated  or revoked sooner.       Influenza A by PCR NEGATIVE NEGATIVE Final   Influenza B by PCR NEGATIVE NEGATIVE Final    Comment: (NOTE) The Xpert Xpress SARS-CoV-2/FLU/RSV plus assay is intended as an aid in the diagnosis of influenza from Nasopharyngeal swab specimens and should not be used as a sole basis for treatment. Nasal washings and aspirates are unacceptable for Xpert Xpress SARS-CoV-2/FLU/RSV testing.  Fact Sheet for Patients: BloggerCourse.com  Fact Sheet for Healthcare Providers: SeriousBroker.it  This test is not yet approved or cleared by the [place] FDA and has been authorized for detection and/or diagnosis of SARS-CoV-2 by FDA under an Emergency Use Authorization (EUA). This EUA will remain in effect (meaning this test can be used) for the duration of the COVID-19 declaration under Section 564(b)(1) of the Act, 21 U.S.C. section 360bbb-3(b)(1), unless the authorization is terminated or revoked.     Resp Syncytial Virus by PCR NEGATIVE NEGATIVE Final    Comment: (NOTE) Fact Sheet for Patients: BloggerCourse.com  Fact Sheet for Healthcare Providers: SeriousBroker.it  This test is not yet approved or cleared by the [place] FDA and has been authorized for detection and/or diagnosis of SARS-CoV-2 by FDA under an Emergency Use Authorization (EUA). This EUA will remain in effect (meaning this test can be used) for the duration of the COVID-19 declaration under Section 564(b)(1) of the Act, 21 U.S.C. section 360bbb-3(b)(1), unless the  authorization is terminated or revoked.  Performed at [facility], [address] [place], [place] [address]   Culture, blood (x 2)     Status: None   Collection Time: 06/16/23 10:27 PM   Specimen: BLOOD  Result Value Ref Range Status   Specimen Description BLOOD BLOOD LEFT HAND  Final   Special Requests   Final    BOTTLES DRAWN AEROBIC AND ANAEROBIC Blood Culture adequate volume   Culture   Final    NO GROWTH 5 DAYS Performed at [facility], [address], [place], [place] [address]    Report Status 06/21/2023 FINAL  Final  Culture, blood (x 2)     Status: Abnormal   Collection Time: 06/16/23 10:34 PM   Specimen: BLOOD  Result Value Ref Range Status   Specimen Description   Final    BLOOD BLOOD RIGHT  HAND Performed at [facility], [address], [place], [place] [address]    Special Requests   Final    BOTTLES DRAWN AEROBIC AND ANAEROBIC Blood Culture adequate volume Performed at [facility], [address] [place], [place] [address]    Culture  Setup Time   Final    IN BOTH AEROBIC AND ANAEROBIC BOTTLES GRAM POSITIVE COCCI CRITICAL RESULT CALLED TO, READ BACK BY AND VERIFIED WITH: ALEX CHAPPELL 06/17/23 1516 KLW    Culture (A)  Final    STAPHYLOCOCCUS HOMINIS STAPHYLOCOCCUS EPIDERMIDIS THE SIGNIFICANCE OF ISOLATING THIS ORGANISM FROM A SINGLE SET OF BLOOD CULTURES WHEN MULTIPLE SETS ARE DRAWN IS UNCERTAIN. PLEASE NOTIFY THE MICROBIOLOGY DEPARTMENT WITHIN ONE WEEK IF SPECIATION AND SENSITIVITIES ARE REQUIRED. Performed at [facility], [address], [place], [place] [address]    Report Status 06/19/2023 FINAL  Final  Blood Culture ID Panel (Reflexed)     Status: Abnormal   Collection Time: 06/16/23 10:34 PM  Result Value Ref Range Status   Enterococcus faecalis NOT DETECTED NOT DETECTED Final   Enterococcus Faecium NOT DETECTED NOT DETECTED Final   Listeria monocytogenes NOT DETECTED NOT DETECTED Final    Staphylococcus species DETECTED (A) NOT DETECTED Final    Comment: CRITICAL RESULT CALLED TO, READ BACK BY AND VERIFIED WITH: ALEX CHAPPELL 06/17/23 1516 KLW    Staphylococcus aureus (BCID) NOT DETECTED NOT DETECTED Final   Staphylococcus epidermidis DETECTED (A) NOT DETECTED Final    Comment: Methicillin (oxacillin) resistant coagulase negative staphylococcus. Possible blood culture contaminant (unless isolated from more than one blood culture draw or clinical case suggests pathogenicity). No antibiotic treatment is indicated for blood  culture contaminants. CRITICAL RESULT CALLED TO, READ BACK BY AND VERIFIED WITH: ALEX CHAPPELL 06/17/23 1516 KLW    Staphylococcus lugdunensis NOT DETECTED NOT DETECTED Final   Streptococcus species NOT DETECTED NOT DETECTED Final   Streptococcus agalactiae NOT DETECTED NOT DETECTED Final   Streptococcus pneumoniae NOT DETECTED NOT DETECTED Final   Streptococcus pyogenes NOT DETECTED NOT DETECTED Final   A.calcoaceticus-baumannii NOT DETECTED NOT DETECTED Final   Bacteroides fragilis NOT DETECTED NOT DETECTED Final   Enterobacterales NOT DETECTED NOT DETECTED Final   Enterobacter cloacae complex NOT DETECTED NOT DETECTED Final   Escherichia coli NOT DETECTED NOT DETECTED Final   Klebsiella aerogenes NOT DETECTED NOT DETECTED Final   Klebsiella oxytoca NOT DETECTED NOT DETECTED Final   Klebsiella pneumoniae NOT DETECTED NOT DETECTED Final   Proteus species NOT DETECTED NOT DETECTED Final   Salmonella species NOT DETECTED NOT DETECTED Final   Serratia marcescens NOT DETECTED NOT DETECTED Final   Haemophilus influenzae NOT DETECTED NOT DETECTED Final   Neisseria meningitidis NOT DETECTED NOT DETECTED Final   Pseudomonas aeruginosa NOT DETECTED NOT DETECTED Final   Stenotrophomonas maltophilia NOT DETECTED NOT DETECTED Final   Candida albicans NOT DETECTED NOT DETECTED Final   Candida auris NOT DETECTED NOT DETECTED Final   Candida glabrata NOT DETECTED  NOT DETECTED Final   Candida krusei NOT DETECTED NOT DETECTED Final   Candida parapsilosis NOT DETECTED NOT DETECTED Final   Candida tropicalis NOT DETECTED NOT DETECTED Final   Cryptococcus neoformans/gattii NOT DETECTED NOT DETECTED Final   Methicillin resistance mecA/C DETECTED (A) NOT DETECTED Final    Comment: CRITICAL RESULT CALLED TO, READ BACK BY AND VERIFIED WITH: ALEX CHAPPELL 06/17/23 1516 KLW Performed at [facility], [address] [place], [place] [address]  Radiology Studies: No results found.      Scheduled Meds:  atenolol  25 mg Oral Daily   atorvastatin  10 mg Oral Daily   Chlorhexidine Gluconate Cloth  6 each Topical Q0600   enoxaparin (LOVENOX) injection  40 mg Subcutaneous Q24H   feeding supplement  237 mL Oral BID BM   folic acid  1 mg Oral Daily   multivitamin with minerals  1 tablet Oral Daily   pantoprazole  40 mg Oral BID   polyethylene glycol  34 g Oral Daily   senna  1 tablet Oral Daily   sodium chloride flush  10-40 mL Intracatheter Q12H   tamsulosin  0.4 mg Oral Daily   Continuous Infusions:  sodium chloride 10 mL/hr at 06/20/23 1840      LOS: 4 days     Silvano Bilis, MD [facility]   If 7PM-7AM, please contact night-coverage [email or web address] Password [facility] 06/21/2023, 3:18 PM

## 2023-06-22 DIAGNOSIS — R109 Unspecified abdominal pain: Secondary | ICD-10-CM | POA: Diagnosis not present

## 2023-06-22 MED ORDER — BACLOFEN 10 MG PO TABS
10.0000 mg | ORAL_TABLET | Freq: Three times a day (TID) | ORAL | Status: DC | PRN
  Administered 2023-06-22 – 2023-06-30 (×19): 10 mg via ORAL
  Filled 2023-06-22 (×21): qty 1

## 2023-06-22 NOTE — TOC Progression Note (Signed)
Transition of Care (TOC) - Progression Note    Patient Details  Name: Douglas Peterson. MRN: [id number] Date of Birth: [birth year]  Transition of Care [facility]) CM/SW Contact  [name], RN Phone Number: 06/22/2023, 9:16 AM  Clinical Narrative:     MD aware that peer to peer deadline is 12 pm today        Expected Discharge Plan and Services                                               Social Determinants of Health (SDOH) Interventions SDOH Screenings   Food Insecurity: No Food Insecurity (06/16/2023)  Housing: Low Risk  (06/16/2023)  Transportation Needs: No Transportation Needs (06/16/2023)  Utilities: Not At Risk (06/16/2023)  Financial Resource Strain: Unknown (06/13/2019)  Physical Activity: Unknown (06/13/2019)  Social Connections: Unknown (06/13/2019)  Stress: Unknown (06/13/2019)  Tobacco Use: Medium Risk (06/16/2023)    Readmission Risk Interventions    06/17/2023    4:23 PM 04/29/2022   10:26 AM 03/14/2022   12:48 PM  Readmission Risk Prevention Plan  Transportation Screening Complete Complete Complete  PCP or Specialist Appt within 3-5 Days   Complete  HRI or Home Care Consult Complete Complete Complete  Social Work Consult for Recovery Care Planning/Counseling Complete Complete Complete  Palliative Care Screening Not Applicable Not Applicable Not Applicable  Medication Review Oceanographer) Complete Complete Complete

## 2023-06-22 NOTE — Plan of Care (Signed)

## 2023-06-22 NOTE — Progress Notes (Signed)
PROGRESS NOTE    Douglas Peterson.  ZOX:[id number] DOB: 11-29-1950 DOA: 06/16/2023 PCP: Melonie Florida, FNP      Brief Narrative:   Past medical history of chronic alcohol use, atrial fibrillation not on anticoagulation, hyponatremia, TBI, who presents to the emergency department with multiple symptoms.   He notes that he is continuing to have abdominal discomfort and nausea and vomiting.  He feels fatigued.  He has shortness of breath.  He has had a cough and feels like his chest is congested.  He denies fever or chest pain.  He denies abdominal pain or GU symptoms.   He continues to drink a significant amount of alcohol and his last drink was just prior to arrival.  He denies drug use.   When I ask him to clarify why he was here in the emergency department despite being evaluated 2 days ago, he notes that his cough is worse and his breathing is worse and all other symptoms are chronic and ongoing and unchanged.   Assessment & Plan:   Principal Problem:   Abdominal pain Active Problems:   Alcohol use disorder, severe, dependence (HCC)   SIRS (systemic inflammatory response syndrome) (HCC)   Elevated lactic acid level   GERD (gastroesophageal reflux disease)   Chronic diastolic CHF (congestive heart failure) (HCC)   Paroxysmal A-fib (HCC)   Dyslipidemia   Essential hypertension   BPH (benign prostatic hyperplasia)   Left inguinal hernia  # Abdominal pain Resolved. CT abd/pelvis 8/11 negative. Normal labs here. Suspect gastritis from alcohol abuse - continue PPI  # Lactic acidosis 2/2 alcohol intoxication, resolved with fluids. No s/s infection  # Staph epi & hominis positive blood culture In one of two bottles, clinically relatively low suspicion for bacteremia - continue to follow clinically  # Dysphagia Severe, SLP advising NPO and feeding tube but patient previously declined, aware of risks - dysphagia diet  # Alcohol abuse # Hospital delirium No withdrawal  symptoms today. improving - continue ciwa lorazepam, vitamins - now s/p high dose thiamine  # Debility PT advising SNF, patient initially hesitant but now says he doesn't feel safe going home and requests SNF - TOC consulted, [facility] has accepted, pending insurance auth - peer to peer performed today we are awaiting a decision  # A-fib, paroxysmal Rate controlled, not on anticoagulant - continue coreg  # HFpEF Euvolemic  # HTN Controlled - cont home coreg  # BPH - cont home flomax   DVT prophylaxis: lovenox Code Status: full Family Communication: daughter updated telephonically 8/15  Level of care: Med-Surg Status is: inpatient    Consultants:  none  Procedures: none  Antimicrobials:  none    Subjective: Up in chair, nausea improving, no other complaints  Objective: Vitals:   06/21/23 1955 06/22/23 0443 06/22/23 0832 06/22/23 1538  BP: 135/88 136/81 (!) 136/92 128/74  Pulse: 80 61 80 71  Resp: 19 19 18 18   Temp: 98.2 F (36.8 C)  98.4 F (36.9 C) 97.9 F (36.6 C)  TempSrc: Oral  Oral Oral  SpO2: 94% 97% 96% 96%  Weight:      Height:        Intake/Output Summary (Last 24 hours) at 06/22/2023 1612 Last data filed at 06/22/2023 1119 Gross per 24 hour  Intake 240 ml  Output 950 ml  Net -710 ml   Filed Weights   06/17/23 0407 06/18/23 0425 06/21/23 0447  Weight: 59.5 kg 58 kg 56.8 kg    Examination:  General  exam: Appears calm and comfortable  Respiratory system: Clear to auscultation. Respiratory effort normal. Cardiovascular system: S1 & S2 heard, RRR. No JVD, murmurs, rubs, gallops or clicks. No pedal edema. Gastrointestinal system: Abdomen is nondistended, soft and nontender. No organomegaly or masses felt. Normal bowel sounds heard. Central nervous system: Alert   Extremities: right arm contracted Skin: No rashes, lesions or ulcers Psychiatry: calm, appears depressed,     Data Reviewed: I have personally reviewed following labs and  imaging studies  CBC: Recent Labs  Lab 06/16/23 1410 06/17/23 0151 06/21/23 0347  WBC 18.9* 11.7* 6.3  HGB 16.8 13.7 14.7  HCT 48.2 39.6 42.1  MCV 90.1 92.1 89.6  PLT 254 190 136*   Basic Metabolic Panel: Recent Labs  Lab 06/16/23 1410 06/17/23 0151 06/21/23 0347  NA 134* 137 134*  K 3.8 3.8 3.6  CL 96* 103 101  CO2 26 24 24   GLUCOSE 107* 79 107*  BUN 6* 7* 12  CREATININE 0.57* 0.55* 0.57*  CALCIUM 8.2* 7.5* 8.7*  MG 2.0  --   --   PHOS 3.4  --   --    GFR: Estimated Creatinine Clearance: 67.1 mL/min (A) (by C-G formula based on SCr of 0.57 mg/dL (L)). Liver Function Tests: Recent Labs  Lab 06/16/23 1410 06/17/23 0151 06/21/23 0347  AST 49* 36 49*  ALT 58* 41 58*  ALKPHOS 82 64 83  BILITOT 0.9 0.9 0.9  PROT 7.4 5.6* 6.6  ALBUMIN 3.9 2.8* 3.5   Recent Labs  Lab 06/16/23 1410  LIPASE 32   No results for input(s): "AMMONIA" in the last 168 hours. Coagulation Profile: No results for input(s): "INR", "PROTIME" in the last 168 hours. Cardiac Enzymes: No results for input(s): "CKTOTAL", "CKMB", "CKMBINDEX", "TROPONINI" in the last 168 hours. BNP (last 3 results) No results for input(s): "PROBNP" in the last 8760 hours. HbA1C: No results for input(s): "HGBA1C" in the last 72 hours. CBG: No results for input(s): "GLUCAP" in the last 168 hours. Lipid Profile: No results for input(s): "CHOL", "HDL", "LDLCALC", "TRIG", "CHOLHDL", "LDLDIRECT" in the last 72 hours. Thyroid Function Tests: No results for input(s): "TSH", "T4TOTAL", "FREET4", "T3FREE", "THYROIDAB" in the last 72 hours. Anemia Panel: No results for input(s): "VITAMINB12", "FOLATE", "FERRITIN", "TIBC", "IRON", "RETICCTPCT" in the last 72 hours. Urine analysis:    Component Value Date/Time   COLORURINE YELLOW (A) 06/16/2023 1718   APPEARANCEUR CLEAR (A) 06/16/2023 1718   LABSPEC 1.016 06/16/2023 1718   PHURINE 6.0 06/16/2023 1718   GLUCOSEU NEGATIVE 06/16/2023 1718   HGBUR NEGATIVE 06/16/2023  1718   BILIRUBINUR NEGATIVE 06/16/2023 1718   KETONESUR NEGATIVE 06/16/2023 1718   PROTEINUR NEGATIVE 06/16/2023 1718   NITRITE NEGATIVE 06/16/2023 1718   LEUKOCYTESUR NEGATIVE 06/16/2023 1718   Sepsis Labs: @LABRCNTIP (procalcitonin:4,lacticidven:4)  ) Recent Results (from the past 240 hour(s))  Resp panel by RT-PCR (RSV, Flu A&B, Covid) Anterior Nasal Swab     Status: None   Collection Time: 06/16/23  4:40 PM   Specimen: Anterior Nasal Swab  Result Value Ref Range Status   SARS Coronavirus 2 by RT PCR NEGATIVE NEGATIVE Final    Comment: (NOTE) SARS-CoV-2 target nucleic acids are NOT DETECTED.  The SARS-CoV-2 RNA is generally detectable in upper respiratory specimens during the acute phase of infection. The lowest concentration of SARS-CoV-2 viral copies this assay can detect is 138 copies/mL. A negative result does not preclude SARS-Cov-2 infection and should not be used as the sole basis for treatment or other patient management  decisions. A negative result may occur with  improper specimen collection/handling, submission of specimen other than nasopharyngeal swab, presence of viral mutation(s) within the areas targeted by this assay, and inadequate number of viral copies(<138 copies/mL). A negative result must be combined with clinical observations, patient history, and epidemiological information. The expected result is Negative.  Fact Sheet for Patients:  BloggerCourse.com  Fact Sheet for Healthcare Providers:  SeriousBroker.it  This test is no t yet approved or cleared by the [place] FDA and  has been authorized for detection and/or diagnosis of SARS-CoV-2 by FDA under an Emergency Use Authorization (EUA). This EUA will remain  in effect (meaning this test can be used) for the duration of the COVID-19 declaration under Section 564(b)(1) of the Act, 21 U.S.C.section 360bbb-3(b)(1), unless the authorization is  terminated  or revoked sooner.       Influenza A by PCR NEGATIVE NEGATIVE Final   Influenza B by PCR NEGATIVE NEGATIVE Final    Comment: (NOTE) The Xpert Xpress SARS-CoV-2/FLU/RSV plus assay is intended as an aid in the diagnosis of influenza from Nasopharyngeal swab specimens and should not be used as a sole basis for treatment. Nasal washings and aspirates are unacceptable for Xpert Xpress SARS-CoV-2/FLU/RSV testing.  Fact Sheet for Patients: BloggerCourse.com  Fact Sheet for Healthcare Providers: SeriousBroker.it  This test is not yet approved or cleared by the [place] FDA and has been authorized for detection and/or diagnosis of SARS-CoV-2 by FDA under an Emergency Use Authorization (EUA). This EUA will remain in effect (meaning this test can be used) for the duration of the COVID-19 declaration under Section 564(b)(1) of the Act, 21 U.S.C. section 360bbb-3(b)(1), unless the authorization is terminated or revoked.     Resp Syncytial Virus by PCR NEGATIVE NEGATIVE Final    Comment: (NOTE) Fact Sheet for Patients: BloggerCourse.com  Fact Sheet for Healthcare Providers: SeriousBroker.it  This test is not yet approved or cleared by the [place] FDA and has been authorized for detection and/or diagnosis of SARS-CoV-2 by FDA under an Emergency Use Authorization (EUA). This EUA will remain in effect (meaning this test can be used) for the duration of the COVID-19 declaration under Section 564(b)(1) of the Act, 21 U.S.C. section 360bbb-3(b)(1), unless the authorization is terminated or revoked.  Performed at [facility], [address] [place], [place] [address]   Culture, blood (x 2)     Status: None   Collection Time: 06/16/23 10:27 PM   Specimen: BLOOD  Result Value Ref Range Status   Specimen Description BLOOD BLOOD LEFT HAND  Final   Special  Requests   Final    BOTTLES DRAWN AEROBIC AND ANAEROBIC Blood Culture adequate volume   Culture   Final    NO GROWTH 5 DAYS Performed at [facility], [address], [place], [place] [address]    Report Status 06/21/2023 FINAL  Final  Culture, blood (x 2)     Status: Abnormal   Collection Time: 06/16/23 10:34 PM   Specimen: BLOOD  Result Value Ref Range Status   Specimen Description   Final    BLOOD BLOOD RIGHT HAND Performed at [facility], [address] [place], [place] [address]    Special Requests   Final    BOTTLES DRAWN AEROBIC AND ANAEROBIC Blood Culture adequate volume Performed at [facility], [address], [place], [place] [address]    Culture  Setup Time   Final    IN BOTH AEROBIC AND ANAEROBIC  BOTTLES GRAM POSITIVE COCCI CRITICAL RESULT CALLED TO, READ BACK BY AND VERIFIED WITH: ALEX CHAPPELL 06/17/23 1516 KLW    Culture (A)  Final    STAPHYLOCOCCUS HOMINIS STAPHYLOCOCCUS EPIDERMIDIS THE SIGNIFICANCE OF ISOLATING THIS ORGANISM FROM A SINGLE SET OF BLOOD CULTURES WHEN MULTIPLE SETS ARE DRAWN IS UNCERTAIN. PLEASE NOTIFY THE MICROBIOLOGY DEPARTMENT WITHIN ONE WEEK IF SPECIATION AND SENSITIVITIES ARE REQUIRED. Performed at [facility], [address] [place], [place] [address]    Report Status 06/19/2023 FINAL  Final  Blood Culture ID Panel (Reflexed)     Status: Abnormal   Collection Time: 06/16/23 10:34 PM  Result Value Ref Range Status   Enterococcus faecalis NOT DETECTED NOT DETECTED Final   Enterococcus Faecium NOT DETECTED NOT DETECTED Final   Listeria monocytogenes NOT DETECTED NOT DETECTED Final   Staphylococcus species DETECTED (A) NOT DETECTED Final    Comment: CRITICAL RESULT CALLED TO, READ BACK BY AND VERIFIED WITH: ALEX CHAPPELL 06/17/23 1516 KLW    Staphylococcus aureus (BCID) NOT DETECTED NOT DETECTED Final   Staphylococcus epidermidis DETECTED (A) NOT DETECTED Final    Comment: Methicillin (oxacillin)  resistant coagulase negative staphylococcus. Possible blood culture contaminant (unless isolated from more than one blood culture draw or clinical case suggests pathogenicity). No antibiotic treatment is indicated for blood  culture contaminants. CRITICAL RESULT CALLED TO, READ BACK BY AND VERIFIED WITH: ALEX CHAPPELL 06/17/23 1516 KLW    Staphylococcus lugdunensis NOT DETECTED NOT DETECTED Final   Streptococcus species NOT DETECTED NOT DETECTED Final   Streptococcus agalactiae NOT DETECTED NOT DETECTED Final   Streptococcus pneumoniae NOT DETECTED NOT DETECTED Final   Streptococcus pyogenes NOT DETECTED NOT DETECTED Final   A.calcoaceticus-baumannii NOT DETECTED NOT DETECTED Final   Bacteroides fragilis NOT DETECTED NOT DETECTED Final   Enterobacterales NOT DETECTED NOT DETECTED Final   Enterobacter cloacae complex NOT DETECTED NOT DETECTED Final   Escherichia coli NOT DETECTED NOT DETECTED Final   Klebsiella aerogenes NOT DETECTED NOT DETECTED Final   Klebsiella oxytoca NOT DETECTED NOT DETECTED Final   Klebsiella pneumoniae NOT DETECTED NOT DETECTED Final   Proteus species NOT DETECTED NOT DETECTED Final   Salmonella species NOT DETECTED NOT DETECTED Final   Serratia marcescens NOT DETECTED NOT DETECTED Final   Haemophilus influenzae NOT DETECTED NOT DETECTED Final   Neisseria meningitidis NOT DETECTED NOT DETECTED Final   Pseudomonas aeruginosa NOT DETECTED NOT DETECTED Final   Stenotrophomonas maltophilia NOT DETECTED NOT DETECTED Final   Candida albicans NOT DETECTED NOT DETECTED Final   Candida auris NOT DETECTED NOT DETECTED Final   Candida glabrata NOT DETECTED NOT DETECTED Final   Candida krusei NOT DETECTED NOT DETECTED Final   Candida parapsilosis NOT DETECTED NOT DETECTED Final   Candida tropicalis NOT DETECTED NOT DETECTED Final   Cryptococcus neoformans/gattii NOT DETECTED NOT DETECTED Final   Methicillin resistance mecA/C DETECTED (A) NOT DETECTED Final    Comment:  CRITICAL RESULT CALLED TO, READ BACK BY AND VERIFIED WITH: ALEX CHAPPELL 06/17/23 1516 KLW Performed at [facility], [address], [place], [place] [address]          Radiology Studies: No results found.      Scheduled Meds:  atenolol  25 mg Oral Daily   atorvastatin  10 mg Oral Daily   Chlorhexidine Gluconate Cloth  6 each Topical Q0600   enoxaparin (LOVENOX) injection  40 mg Subcutaneous Q24H   feeding supplement  237 mL Oral BID BM   folic acid  1  mg Oral Daily   melatonin  5 mg Oral QHS   multivitamin with minerals  1 tablet Oral Daily   pantoprazole  40 mg Oral BID   polyethylene glycol  34 g Oral Daily   senna  1 tablet Oral Daily   sodium chloride flush  10-40 mL Intracatheter Q12H   tamsulosin  0.4 mg Oral Daily   Continuous Infusions:  sodium chloride 10 mL/hr at 06/20/23 1840      LOS: 5 days     Silvano Bilis, MD [facility]   If 7PM-7AM, please contact night-coverage [email or web address] Password [facility] 06/22/2023, 4:12 PM

## 2023-06-22 NOTE — Care Management Important Message (Signed)
Important Message  Patient Details  Name: Douglas Peterson. MRN: [id number] Date of Birth: [birth year]   Medicare Important Message Given:  Yes     [name] 06/22/2023, 11:08 AM

## 2023-06-22 NOTE — Progress Notes (Signed)
Mobility Specialist - Progress Note   06/22/23 1035  Mobility  Activity Ambulated with assistance in hallway  Level of Assistance Contact guard assist, steadying assist  Assistive Device Cane  Distance Ambulated (ft) 170 ft  Activity Response Tolerated well  Mobility Referral Yes  $Mobility charge 1 Mobility  Mobility Specialist Start Time (ACUTE ONLY) 1005  Mobility Specialist Stop Time (ACUTE ONLY) 1025  Mobility Specialist Time Calculation (min) (ACUTE ONLY) 20 min   Pt sitting in recliner on RA upon arrival. Pt STS and ambulates in hallway CGA with no LOB noted. Pt returns to bed and able to reposition indep. Pt left in bed with needs in reach and bed alarm activated.   Terrilyn Saver  Mobility Specialist  06/22/23 10:37 AM

## 2023-06-22 NOTE — Progress Notes (Signed)
Mobility Specialist - Progress Note   06/22/23 1500  Mobility  Activity Ambulated with assistance in hallway  Level of Assistance Contact guard assist, steadying assist  Assistive Device Front wheel walker  Distance Ambulated (ft) 320 ft  Activity Response Tolerated well  $Mobility charge 1 Mobility     Pt lying in bed upon arrival, utilizing RA. Pt agreeable to activity. Completed bed mobility modI; ambulated hallway with CGA. No LOB. Voiced pain in R foot with activity. Pt left in bed with alarm set, needs in reach.    [name] Mobility Specialist 06/22/23, 3:26 PM

## 2023-06-22 NOTE — Progress Notes (Signed)
Physical Therapy Treatment Patient Details Name: Douglas Peterson. MRN: [id number] DOB: 05/24/1951 Today's Date: 06/22/2023   History of Present Illness 72 y.o. 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     Patient states that he has nausea, vomiting, abdominal pain in the past 3 days.  He has several episodes of nonbilious nonbloody vomiting.  Denies diarrhea.  His abdominal pain is located in central and upper abdomen, intermittent, mild to moderate, aching, nonradiating.  Pt was here for similar 8/11.    PT Comments  Pt was long sitting in bed upon arrival. He is much more alert than previously observed late last week. Pt is well known by author from previous admissions.  He is at baseline cognition however continues to present with unsteadiness/ not baseline balance. He required assistance to safely stand and ambulate however did tolerate increased gait distances. Acute PT will continue to follow and progress per current POC. DC recs remain appropriate since pt is still not at baseline.    If plan is discharge home, recommend the following: A lot of help with walking and/or transfers;A lot of help with bathing/dressing/bathroom;Assistance with cooking/housework;Direct supervision/assist for medications management;Direct supervision/assist for financial management;Assist for transportation;Help with stairs or ramp for entrance;Supervision due to cognitive status     Equipment Recommendations  None recommended by PT       Precautions / Restrictions Precautions Precautions: Fall Restrictions Weight Bearing Restrictions: No     Mobility  Bed Mobility Overal bed mobility: Modified Independent Bed Mobility: Supine to Sit   Supine to sit: Min assist  General bed mobility comments: HOB elevated, heavy use of bed rails.    Transfers Overall transfer level: Needs assistance Equipment used: Straight cane Transfers: Sit to/from Stand Sit to Stand: Contact guard assist  General transfer comment: CGA for safety. vcs for improved technique. slightly unsteady upon standing that improved with time.    Ambulation/Gait Ambulation/Gait assistance: Contact guard assist, Min assist Gait Distance (Feet): 120 Feet Assistive device: Straight cane Gait Pattern/deviations: Step-to pattern, Narrow base of support, Staggering left, Staggering right Gait velocity: decreased  General Gait Details: Pt tolerated gait ~ 120 ft. He still presents with unsteadiness at times but overall tolerated increased gait distance.    Balance Overall balance assessment: Needs assistance Sitting-balance support: Single extremity supported, Feet supported Sitting balance-Leahy Scale: Good     Standing balance support: Single extremity supported, During functional activity, Reliant on assistive device for balance Standing balance-Leahy Scale: Poor Standing balance comment: pt does have unsteadiness at times     Cognition Arousal: Alert Behavior During Therapy: WFL for tasks assessed/performed Overall Cognitive Status: Within Functional Limits for tasks assessed      General Comments: Pt is A and O x 4. Much more alert this session versus previous session. seems to be at basleine cognition. Pt is well known by author from previous admission.               Pertinent Vitals/Pain Pain Assessment Pain Assessment: No/denies pain     PT Goals (current goals can now be found in the care plan section) Acute Rehab PT Goals Patient Stated Goal: go home Progress towards PT goals: Progressing toward goals    Frequency    Min 1X/week      PT  Plan Current plan remains appropriate       AM-PAC PT "6 Clicks" Mobility   Outcome  Measure  Help needed turning from your back to your side while in a flat bed without using bedrails?: A Little Help needed moving from lying on your back to sitting on the side of a flat bed without using bedrails?: A Little Help needed moving to and from a bed to a chair (including a wheelchair)?: A Little Help needed standing up from a chair using your arms (e.g., wheelchair or bedside chair)?: A Little Help needed to walk in hospital room?: A Little Help needed climbing 3-5 steps with a railing? : A Little 6 Click Score: 18    End of Session   Activity Tolerance: Patient tolerated treatment well Patient left: in chair;with call bell/phone within reach;with chair alarm set Nurse Communication: Mobility status PT Visit Diagnosis: Unsteadiness on feet (R26.81);Difficulty in walking, not elsewhere classified (R26.2);Muscle weakness (generalized) (M62.81)     Time: 1610-9604 PT Time Calculation (min) (ACUTE ONLY): 25 min  Charges:    $Gait Training: 8-22 mins $Therapeutic Activity: 8-22 mins PT General Charges $$ ACUTE PT VISIT: 1 Visit                    Jetta Lout PTA 06/22/23, 11:50 AM

## 2023-06-23 DIAGNOSIS — R109 Unspecified abdominal pain: Secondary | ICD-10-CM | POA: Diagnosis not present

## 2023-06-23 NOTE — TOC Progression Note (Signed)
Transition of Care (TOC) - Progression Note    Patient Details  Name: Douglas Peterson. MRN: [id number] Date of Birth: Sep 14, 1951  Transition of Care [facility]) CM/SW Contact  [name], RN Phone Number: 06/23/2023, 11:28 AM  Clinical Narrative:     Message sent to Tammy at [facility] for update on Auth         Expected Discharge Plan and Services                                               Social Determinants of Health (SDOH) Interventions SDOH Screenings   Food Insecurity: No Food Insecurity (06/16/2023)  Housing: Low Risk  (06/16/2023)  Transportation Needs: No Transportation Needs (06/16/2023)  Utilities: Not At Risk (06/16/2023)  Financial Resource Strain: Unknown (06/13/2019)  Physical Activity: Unknown (06/13/2019)  Social Connections: Unknown (06/13/2019)  Stress: Unknown (06/13/2019)  Tobacco Use: Medium Risk (06/16/2023)    Readmission Risk Interventions    06/17/2023    4:23 PM 04/29/2022   10:26 AM 03/14/2022   12:48 PM  Readmission Risk Prevention Plan  Transportation Screening Complete Complete Complete  PCP or Specialist Appt within 3-5 Days   Complete  HRI or Home Care Consult Complete Complete Complete  Social Work Consult for Recovery Care Planning/Counseling Complete Complete Complete  Palliative Care Screening Not Applicable Not Applicable Not Applicable  Medication Review Oceanographer) Complete Complete Complete

## 2023-06-23 NOTE — Progress Notes (Signed)
Mobility Specialist - Progress Note   06/23/23 1100  Mobility  Activity Ambulated with assistance in hallway;Stood at bedside  Level of Assistance Contact guard assist, steadying assist  Assistive Device Front wheel walker  Distance Ambulated (ft) 340 ft  Activity Response Tolerated well  $Mobility charge 1 Mobility     Pt sitting in recliner upon arrival, utilizing RA. Pt motivated for activity. Completed STS and ambulation in hallway with CGA-minG. No LOB. No complaints. Stood at sink to complete ADLs (teeth brushing, hair combing, gown change) with supervision. Pt returned to chair with alarm set, needs in reach.    Douglas Peterson Mobility Specialist 06/23/23, 11:26 AM

## 2023-06-23 NOTE — TOC Progression Note (Signed)
Transition of Care (TOC) - Progression Note    Patient Details  Name: Douglas Peterson. MRN: [id number] Date of Birth: 1951-07-28  Transition of Care [facility]) CM/SW Contact  [name], RN Phone Number: 06/23/2023, 12:27 PM  Clinical Narrative:     [facility] Harvey was denied.  Patient provided with denial letter.  Patient to call and complete fast track appeal.  MD and Tammy at [facility] aware        Expected Discharge Plan and Services                                               Social Determinants of Health (SDOH) Interventions SDOH Screenings   Food Insecurity: No Food Insecurity (06/16/2023)  Housing: Low Risk  (06/16/2023)  Transportation Needs: No Transportation Needs (06/16/2023)  Utilities: Not At Risk (06/16/2023)  Financial Resource Strain: Unknown (06/13/2019)  Physical Activity: Unknown (06/13/2019)  Social Connections: Unknown (06/13/2019)  Stress: Unknown (06/13/2019)  Tobacco Use: Medium Risk (06/16/2023)    Readmission Risk Interventions    06/17/2023    4:23 PM 04/29/2022   10:26 AM 03/14/2022   12:48 PM  Readmission Risk Prevention Plan  Transportation Screening Complete Complete Complete  PCP or Specialist Appt within 3-5 Days   Complete  HRI or Home Care Consult Complete Complete Complete  Social Work Consult for Recovery Care Planning/Counseling Complete Complete Complete  Palliative Care Screening Not Applicable Not Applicable Not Applicable  Medication Review Oceanographer) Complete Complete Complete

## 2023-06-23 NOTE — Progress Notes (Addendum)
PROGRESS NOTE    Douglas Peterson.  IRJ:[id number] DOB: [birth year] DOA: 06/16/2023 PCP: Melonie Florida, FNP      Brief Narrative:   Past medical history of chronic alcohol use, atrial fibrillation not on anticoagulation, hyponatremia, TBI, who presents to the emergency department with multiple symptoms.   He notes that he is continuing to have abdominal discomfort and nausea and vomiting.  He feels fatigued.  He has shortness of breath.  He has had a cough and feels like his chest is congested.  He denies fever or chest pain.  He denies abdominal pain or GU symptoms.   He continues to drink a significant amount of alcohol and his last drink was just prior to arrival.  He denies drug use.   When I ask him to clarify why he was here in the emergency department despite being evaluated 2 days ago, he notes that his cough is worse and his breathing is worse and all other symptoms are chronic and ongoing and unchanged.   Assessment & Plan:   Principal Problem:   Abdominal pain Active Problems:   Alcohol use disorder, severe, dependence (HCC)   SIRS (systemic inflammatory response syndrome) (HCC)   Elevated lactic acid level   GERD (gastroesophageal reflux disease)   Chronic diastolic CHF (congestive heart failure) (HCC)   Paroxysmal A-fib (HCC)   Dyslipidemia   Essential hypertension   BPH (benign prostatic hyperplasia)   Left inguinal hernia  # Abdominal pain Resolved. CT abd/pelvis 8/11 negative. Normal labs here. Suspect gastritis from alcohol abuse - continue PPI  # Lactic acidosis 2/2 alcohol intoxication, resolved with fluids. No s/s infection  # Staph epi & hominis positive blood culture In one of two bottles, clinically relatively low suspicion for bacteremia - continue to follow clinically  # Dysphagia Severe, SLP advising NPO and feeding tube but patient previously declined, aware of risks - dysphagia diet  # Alcohol abuse # Hospital delirium No withdrawal  symptoms today. Delirium resolved. - continue ciwa lorazepam, vitamins - now s/p high dose thiamine  # Debility PT advising SNF, patient initially hesitant but now says he doesn't feel safe going home and requests SNF - TOC consulted, [facility] has accepted, pending insurance auth - insurance declined, peer to peer performed 8/19, still declined, patient now planning appeal - daughter says can go to [facility] in [place], was there before, will let TOC know  # A-fib, paroxysmal Rate controlled, not on anticoagulant - continue coreg  # HFpEF Euvolemic  # HTN Controlled - cont home coreg  # BPH - cont home flomax   DVT prophylaxis: lovenox Code Status: full Family Communication: daughter updated telephonically 8/20  Level of care: Med-Surg Status is: inpatient    Consultants:  none  Procedures: none  Antimicrobials:  none    Subjective: Up in chair, tolerated lunch  Objective: Vitals:   06/22/23 2029 06/23/23 0426 06/23/23 0428 06/23/23 0727  BP: (!) 140/75  132/81 131/75  Pulse: 65  (!) 59 68  Resp: 18  18 16   Temp: (!) 97.5 F (36.4 C)  98.3 F (36.8 C) (!) 97.5 F (36.4 C)  TempSrc: Oral   Oral  SpO2: 96%  93% 94%  Weight:  56.6 kg    Height:        Intake/Output Summary (Last 24 hours) at 06/23/2023 1501 Last data filed at 06/22/2023 1917 Gross per 24 hour  Intake 120 ml  Output --  Net 120 ml   [employer]  06/18/23 0425 06/21/23 0447 06/23/23 0426  Weight: 58 kg 56.8 kg 56.6 kg    Examination:  General exam: Appears calm and comfortable  Respiratory system: Clear to auscultation. Respiratory effort normal. Cardiovascular system: S1 & S2 heard, RRR. No JVD, murmurs, rubs, gallops or clicks. No pedal edema. Gastrointestinal system: Abdomen is nondistended, soft and nontender. No organomegaly or masses felt. Normal bowel sounds heard. Central nervous system: Alert   Extremities: right arm contracted Skin: No rashes, lesions or  ulcers Psychiatry: calm, appears depressed,     Data Reviewed: I have personally reviewed following labs and imaging studies  CBC: Recent Labs  Lab 06/17/23 0151 06/21/23 0347  WBC 11.7* 6.3  HGB 13.7 14.7  HCT 39.6 42.1  MCV 92.1 89.6  PLT 190 136*   Basic Metabolic Panel: Recent Labs  Lab 06/17/23 0151 06/21/23 0347  NA 137 134*  K 3.8 3.6  CL 103 101  CO2 24 24  GLUCOSE 79 107*  BUN 7* 12  CREATININE 0.55* 0.57*  CALCIUM 7.5* 8.7*   GFR: Estimated Creatinine Clearance: 66.8 mL/min (A) (by C-G formula based on SCr of 0.57 mg/dL (L)). Liver Function Tests: Recent Labs  Lab 06/17/23 0151 06/21/23 0347  AST 36 49*  ALT 41 58*  ALKPHOS 64 83  BILITOT 0.9 0.9  PROT 5.6* 6.6  ALBUMIN 2.8* 3.5   No results for input(s): "LIPASE", "AMYLASE" in the last 168 hours.  No results for input(s): "AMMONIA" in the last 168 hours. Coagulation Profile: No results for input(s): "INR", "PROTIME" in the last 168 hours. Cardiac Enzymes: No results for input(s): "CKTOTAL", "CKMB", "CKMBINDEX", "TROPONINI" in the last 168 hours. BNP (last 3 results) No results for input(s): "PROBNP" in the last 8760 hours. HbA1C: No results for input(s): "HGBA1C" in the last 72 hours. CBG: No results for input(s): "GLUCAP" in the last 168 hours. Lipid Profile: No results for input(s): "CHOL", "HDL", "LDLCALC", "TRIG", "CHOLHDL", "LDLDIRECT" in the last 72 hours. Thyroid Function Tests: No results for input(s): "TSH", "T4TOTAL", "FREET4", "T3FREE", "THYROIDAB" in the last 72 hours. Anemia Panel: No results for input(s): "VITAMINB12", "FOLATE", "FERRITIN", "TIBC", "IRON", "RETICCTPCT" in the last 72 hours. Urine analysis:    Component Value Date/Time   COLORURINE YELLOW (A) 06/16/2023 1718   APPEARANCEUR CLEAR (A) 06/16/2023 1718   LABSPEC 1.016 06/16/2023 1718   PHURINE 6.0 06/16/2023 1718   GLUCOSEU NEGATIVE 06/16/2023 1718   HGBUR NEGATIVE 06/16/2023 1718   BILIRUBINUR NEGATIVE  06/16/2023 1718   KETONESUR NEGATIVE 06/16/2023 1718   PROTEINUR NEGATIVE 06/16/2023 1718   NITRITE NEGATIVE 06/16/2023 1718   LEUKOCYTESUR NEGATIVE 06/16/2023 1718   Sepsis Labs: @LABRCNTIP (procalcitonin:4,lacticidven:4)  ) Recent Results (from the past 240 hour(s))  Resp panel by RT-PCR (RSV, Flu A&B, Covid) Anterior Nasal Swab     Status: None   Collection Time: 06/16/23  4:40 PM   Specimen: Anterior Nasal Swab  Result Value Ref Range Status   SARS Coronavirus 2 by RT PCR NEGATIVE NEGATIVE Final    Comment: (NOTE) SARS-CoV-2 target nucleic acids are NOT DETECTED.  The SARS-CoV-2 RNA is generally detectable in upper respiratory specimens during the acute phase of infection. The lowest concentration of SARS-CoV-2 viral copies this assay can detect is 138 copies/mL. A negative result does not preclude SARS-Cov-2 infection and should not be used as the sole basis for treatment or other patient management decisions. A negative result may occur with  improper specimen collection/handling, submission of specimen other than nasopharyngeal swab, presence of viral mutation(s)  within the areas targeted by this assay, and inadequate number of viral copies(<138 copies/mL). A negative result must be combined with clinical observations, patient history, and epidemiological information. The expected result is Negative.  Fact Sheet for Patients:  BloggerCourse.com  Fact Sheet for Healthcare Providers:  SeriousBroker.it  This test is no t yet approved or cleared by the [place] FDA and  has been authorized for detection and/or diagnosis of SARS-CoV-2 by FDA under an Emergency Use Authorization (EUA). This EUA will remain  in effect (meaning this test can be used) for the duration of the COVID-19 declaration under Section 564(b)(1) of the Act, 21 U.S.C.section 360bbb-3(b)(1), unless the authorization is terminated  or revoked sooner.        Influenza A by PCR NEGATIVE NEGATIVE Final   Influenza B by PCR NEGATIVE NEGATIVE Final    Comment: (NOTE) The Xpert Xpress SARS-CoV-2/FLU/RSV plus assay is intended as an aid in the diagnosis of influenza from Nasopharyngeal swab specimens and should not be used as a sole basis for treatment. Nasal washings and aspirates are unacceptable for Xpert Xpress SARS-CoV-2/FLU/RSV testing.  Fact Sheet for Patients: BloggerCourse.com  Fact Sheet for Healthcare Providers: SeriousBroker.it  This test is not yet approved or cleared by the [place] FDA and has been authorized for detection and/or diagnosis of SARS-CoV-2 by FDA under an Emergency Use Authorization (EUA). This EUA will remain in effect (meaning this test can be used) for the duration of the COVID-19 declaration under Section 564(b)(1) of the Act, 21 U.S.C. section 360bbb-3(b)(1), unless the authorization is terminated or revoked.     Resp Syncytial Virus by PCR NEGATIVE NEGATIVE Final    Comment: (NOTE) Fact Sheet for Patients: BloggerCourse.com  Fact Sheet for Healthcare Providers: SeriousBroker.it  This test is not yet approved or cleared by the [place] FDA and has been authorized for detection and/or diagnosis of SARS-CoV-2 by FDA under an Emergency Use Authorization (EUA). This EUA will remain in effect (meaning this test can be used) for the duration of the COVID-19 declaration under Section 564(b)(1) of the Act, 21 U.S.C. section 360bbb-3(b)(1), unless the authorization is terminated or revoked.  Performed at [facility], [address] [place], [place] [address]   Culture, blood (x 2)     Status: None   Collection Time: 06/16/23 10:27 PM   Specimen: BLOOD  Result Value Ref Range Status   Specimen Description BLOOD BLOOD LEFT HAND  Final   Special Requests   Final    BOTTLES DRAWN  AEROBIC AND ANAEROBIC Blood Culture adequate volume   Culture   Final    NO GROWTH 5 DAYS Performed at [facility], [address] [place], [place] [address]    Report Status 06/21/2023 FINAL  Final  Culture, blood (x 2)     Status: Abnormal   Collection Time: 06/16/23 10:34 PM   Specimen: BLOOD  Result Value Ref Range Status   Specimen Description   Final    BLOOD BLOOD RIGHT HAND Performed at [facility], [address] [place], [place] [address]    Special Requests   Final    BOTTLES DRAWN AEROBIC AND ANAEROBIC Blood Culture adequate volume Performed at [facility], [address] [place], [place] [address]    Culture  Setup Time   Final    IN BOTH AEROBIC AND ANAEROBIC BOTTLES GRAM POSITIVE COCCI CRITICAL RESULT CALLED TO, READ BACK BY AND VERIFIED WITH: ALEX CHAPPELL 06/17/23 1516 KLW  Culture (A)  Final    STAPHYLOCOCCUS HOMINIS STAPHYLOCOCCUS EPIDERMIDIS THE SIGNIFICANCE OF ISOLATING THIS ORGANISM FROM A SINGLE SET OF BLOOD CULTURES WHEN MULTIPLE SETS ARE DRAWN IS UNCERTAIN. PLEASE NOTIFY THE MICROBIOLOGY DEPARTMENT WITHIN ONE WEEK IF SPECIATION AND SENSITIVITIES ARE REQUIRED. Performed at [facility], [address], [place], [place] [address]    Report Status 06/19/2023 FINAL  Final  Blood Culture ID Panel (Reflexed)     Status: Abnormal   Collection Time: 06/16/23 10:34 PM  Result Value Ref Range Status   Enterococcus faecalis NOT DETECTED NOT DETECTED Final   Enterococcus Faecium NOT DETECTED NOT DETECTED Final   Listeria monocytogenes NOT DETECTED NOT DETECTED Final   Staphylococcus species DETECTED (A) NOT DETECTED Final    Comment: CRITICAL RESULT CALLED TO, READ BACK BY AND VERIFIED WITH: ALEX CHAPPELL 06/17/23 1516 KLW    Staphylococcus aureus (BCID) NOT DETECTED NOT DETECTED Final   Staphylococcus epidermidis DETECTED (A) NOT DETECTED Final    Comment: Methicillin (oxacillin) resistant coagulase negative  staphylococcus. Possible blood culture contaminant (unless isolated from more than one blood culture draw or clinical case suggests pathogenicity). No antibiotic treatment is indicated for blood  culture contaminants. CRITICAL RESULT CALLED TO, READ BACK BY AND VERIFIED WITH: ALEX CHAPPELL 06/17/23 1516 KLW    Staphylococcus lugdunensis NOT DETECTED NOT DETECTED Final   Streptococcus species NOT DETECTED NOT DETECTED Final   Streptococcus agalactiae NOT DETECTED NOT DETECTED Final   Streptococcus pneumoniae NOT DETECTED NOT DETECTED Final   Streptococcus pyogenes NOT DETECTED NOT DETECTED Final   A.calcoaceticus-baumannii NOT DETECTED NOT DETECTED Final   Bacteroides fragilis NOT DETECTED NOT DETECTED Final   Enterobacterales NOT DETECTED NOT DETECTED Final   Enterobacter cloacae complex NOT DETECTED NOT DETECTED Final   Escherichia coli NOT DETECTED NOT DETECTED Final   Klebsiella aerogenes NOT DETECTED NOT DETECTED Final   Klebsiella oxytoca NOT DETECTED NOT DETECTED Final   Klebsiella pneumoniae NOT DETECTED NOT DETECTED Final   Proteus species NOT DETECTED NOT DETECTED Final   Salmonella species NOT DETECTED NOT DETECTED Final   Serratia marcescens NOT DETECTED NOT DETECTED Final   Haemophilus influenzae NOT DETECTED NOT DETECTED Final   Neisseria meningitidis NOT DETECTED NOT DETECTED Final   Pseudomonas aeruginosa NOT DETECTED NOT DETECTED Final   Stenotrophomonas maltophilia NOT DETECTED NOT DETECTED Final   Candida albicans NOT DETECTED NOT DETECTED Final   Candida auris NOT DETECTED NOT DETECTED Final   Candida glabrata NOT DETECTED NOT DETECTED Final   Candida krusei NOT DETECTED NOT DETECTED Final   Candida parapsilosis NOT DETECTED NOT DETECTED Final   Candida tropicalis NOT DETECTED NOT DETECTED Final   Cryptococcus neoformans/gattii NOT DETECTED NOT DETECTED Final   Methicillin resistance mecA/C DETECTED (A) NOT DETECTED Final    Comment: CRITICAL RESULT CALLED TO, READ  BACK BY AND VERIFIED WITH: ALEX CHAPPELL 06/17/23 1516 KLW Performed at [facility], [address] [place], [place] [address]          Radiology Studies: No results found.      Scheduled Meds:  atenolol  25 mg Oral Daily   atorvastatin  10 mg Oral Daily   Chlorhexidine Gluconate Cloth  6 each Topical Q0600   enoxaparin (LOVENOX) injection  40 mg Subcutaneous Q24H   feeding supplement  237 mL Oral BID BM   folic acid  1 mg Oral Daily   melatonin  5 mg Oral QHS   multivitamin with minerals  1 tablet Oral Daily  pantoprazole  40 mg Oral BID   polyethylene glycol  34 g Oral Daily   senna  1 tablet Oral Daily   sodium chloride flush  10-40 mL Intracatheter Q12H   tamsulosin  0.4 mg Oral Daily   Continuous Infusions:  sodium chloride 10 mL/hr at 06/20/23 1840      LOS: 6 days     Silvano Bilis, MD [facility]   If 7PM-7AM, please contact night-coverage [email or web address] Password [email or web address] 06/23/2023, 3:01 PM

## 2023-06-23 NOTE — Progress Notes (Signed)
Physical Therapy Treatment Patient Details Name: Douglas Peterson. MRN: [id number] DOB: Jan 08, 1951 Today's Date: 06/23/2023   History of Present Illness 72 y.o. 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     Patient states that he has nausea, vomiting, abdominal pain in the past 3 days.  He has several episodes of nonbilious nonbloody vomiting.  Denies diarrhea.  His abdominal pain is located in central and upper abdomen, intermittent, mild to moderate, aching, nonradiating.  Pt was here for similar 8/11.    PT Comments  Pt in chair, ready for session.  Stood with supervision to [facility] and is able to walk x 6 laps with contact guard.  Pt does not have shoes here and distance is primarily limited by soreness from hard flooring.  Overall progressing well with mobility and distance.  Pt does not feel he is at his baseline in regards to balance balance and would like SNF if available.  He does live alone with little to no supports. If SNF is not available HHPT/OT and any available support.     If plan is discharge home, recommend the following: Assistance with cooking/housework;Direct supervision/assist for medications management;Direct supervision/assist for financial management;Assist for transportation;Help with stairs or ramp for entrance;Supervision due to cognitive status;A little help with walking and/or transfers;A little help with bathing/dressing/bathroom   Can travel by private vehicle        Equipment Recommendations  None recommended by PT    Recommendations for Other Services       Precautions / Restrictions Precautions Precautions: Fall Restrictions Weight Bearing Restrictions: No     Mobility  Bed Mobility                General bed mobility comments: in reclinr before and after Patient Response: Cooperative  Transfers Overall transfer level: Needs assistance Equipment used: Straight cane Transfers: Sit to/from Stand Sit to Stand: Supervision                Ambulation/Gait Ambulation/Gait assistance: Contact guard assist Gait Distance (Feet): 960 Feet Assistive device: Straight cane Gait Pattern/deviations: Step-to pattern, Narrow base of support, Staggering left, Staggering right Gait velocity: decreased     General Gait Details: x 6 laps limited by foot pain since he is wearing just sock   Stairs             Wheelchair Mobility     Tilt Bed Tilt Bed Patient Response: Cooperative  Modified Rankin (Stroke Patients Only)       Balance Overall balance assessment: Needs assistance Sitting-balance support: Single extremity supported, Feet supported Sitting balance-Leahy Scale: Good     Standing balance support: Single extremity supported, During functional activity, Reliant on assistive device for balance Standing balance-Leahy Scale: Poor                              Cognition Arousal: Alert Behavior During Therapy: WFL for tasks assessed/performed Overall Cognitive Status: Within Functional Limits for tasks assessed                                          Exercises  General Comments        Pertinent Vitals/Pain Pain Assessment Pain Assessment: No/denies pain    Home Living                          Prior Function            PT Goals (current goals can now be found in the care plan section) Progress towards PT goals: Progressing toward goals    Frequency    Min 1X/week      PT Plan      Co-evaluation              AM-PAC PT "6 Clicks" Mobility   Outcome Measure  Help needed turning from your back to your side while in a flat bed without using bedrails?: None Help needed moving  from lying on your back to sitting on the side of a flat bed without using bedrails?: A Little Help needed moving to and from a bed to a chair (including a wheelchair)?: None Help needed standing up from a chair using your arms (e.g., wheelchair or bedside chair)?: None Help needed to walk in hospital room?: A Little Help needed climbing 3-5 steps with a railing? : A Little 6 Click Score: 21    End of Session Equipment Utilized During Treatment: Gait belt Activity Tolerance: Patient tolerated treatment well Patient left: in chair;with call bell/phone within reach;with chair alarm set Nurse Communication: Mobility status PT Visit Diagnosis: Unsteadiness on feet (R26.81);Difficulty in walking, not elsewhere classified (R26.2);Muscle weakness (generalized) (M62.81)     Time: 1610-9604 PT Time Calculation (min) (ACUTE ONLY): 19 min  Charges:    $Gait Training: 8-22 mins PT General Charges $$ ACUTE PT VISIT: 1 Visit                   Danielle Dess, PTA 06/23/23, 12:00 PM

## 2023-06-24 DIAGNOSIS — K2100 Gastro-esophageal reflux disease with esophagitis, without bleeding: Secondary | ICD-10-CM | POA: Diagnosis not present

## 2023-06-24 DIAGNOSIS — F1020 Alcohol dependence, uncomplicated: Secondary | ICD-10-CM | POA: Diagnosis not present

## 2023-06-24 DIAGNOSIS — I5032 Chronic diastolic (congestive) heart failure: Secondary | ICD-10-CM | POA: Diagnosis not present

## 2023-06-24 MED ORDER — SENNOSIDES-DOCUSATE SODIUM 8.6-50 MG PO TABS
1.0000 | ORAL_TABLET | Freq: Two times a day (BID) | ORAL | Status: DC
  Administered 2023-06-24 – 2023-06-26 (×5): 1 via ORAL
  Filled 2023-06-24 (×5): qty 1

## 2023-06-24 MED ORDER — BISACODYL 5 MG PO TBEC
5.0000 mg | DELAYED_RELEASE_TABLET | Freq: Every day | ORAL | Status: DC | PRN
  Administered 2023-06-25 – 2023-06-28 (×2): 5 mg via ORAL
  Filled 2023-06-24 (×2): qty 1

## 2023-06-24 NOTE — Progress Notes (Signed)
Mobility Specialist - Progress Note   06/24/23 1100  Mobility  Activity Ambulated with assistance in hallway;Stood at bedside  Level of Assistance Contact guard assist, steadying assist  Assistive Device Front wheel walker  Distance Ambulated (ft) 320 ft  Activity Response Tolerated well  $Mobility charge 1 Mobility     Pt sitting in chair upon arrival, utilizing RA. Pt agreeable to activity. Ambulated in hallway with CGA-minG. Voiced soreness in R foot d/t hard flooring and no shoes. No LOB. Stood at sink for teeth brushing and face washing prior to return to chair. Alarm set, needs in reach.    [name] Mobility Specialist 06/24/23, 11:10 AM

## 2023-06-24 NOTE — Progress Notes (Signed)
PROGRESS NOTE    Douglas Peterson.  ZOX:[id number] DOB: November 05, 1950 DOA: 06/16/2023 PCP: Melonie Florida, FNP      Brief Narrative:   Past medical history of chronic alcohol use, atrial fibrillation not on anticoagulation, hyponatremia, TBI, who presents to the emergency department with multiple symptoms.   He notes that he is continuing to have abdominal discomfort and nausea and vomiting.  He feels fatigued.  He has shortness of breath.  He has had a cough and feels like his chest is congested.  He denies fever or chest pain.  He denies abdominal pain or GU symptoms.   He continues to drink a significant amount of alcohol and his last drink was just prior to arrival.  He denies drug use.   When I ask him to clarify why he was here in the emergency department despite being evaluated 2 days ago, he notes that his cough is worse and his breathing is worse and all other symptoms are chronic and ongoing and unchanged.   Assessment & Plan:   Principal Problem:   Abdominal pain Active Problems:   Alcohol use disorder, severe, dependence (HCC)   SIRS (systemic inflammatory response syndrome) (HCC)   Elevated lactic acid level   GERD (gastroesophageal reflux disease)   Chronic diastolic CHF (congestive heart failure) (HCC)   Paroxysmal A-fib (HCC)   Dyslipidemia   Essential hypertension   BPH (benign prostatic hyperplasia)   Left inguinal hernia  # Abdominal pain # Nausea - intermittent, suspect chronic Resolved. CT abd/pelvis 8/11 negative. Normal labs here. Suspect gastritis from alcohol abuse - continue PPI - anti-emetics PRN  # Lactic acidosis 2/2 alcohol intoxication, resolved with fluids. No s/s infection  # Staph epi & hominis positive blood culture In one of two bottles, clinically relatively low suspicion for bacteremia - continue to follow clinically  # Dysphagia Severe, SLP advising NPO and feeding tube but patient previously declined, aware of risks - dysphagia  diet (ND-3) - pt requested to resume normal diet - ordered after repeat discussion of risks  # Alcohol abuse # Hospital delirium No withdrawal symptoms. Delirium appears resolved. - continue ciwa lorazepam - will stop tomorrow if scores remain low - vitamins - now s/p high dose thiamine  # Debility PT advising SNF, patient initially hesitant but now says he doesn't feel safe going home and requests SNF - TOC consulted, [facility] has accepted, pending insurance auth - insurance declined, peer to peer performed 8/19, still declined, patient now planning appeal - daughter says can go to [facility] in [place], was there before, will let TOC know  # A-fib, paroxysmal Rate controlled, not on anticoagulant - continue coreg  # HFpEF Euvolemic  # HTN Controlled - cont home coreg  # BPH - cont home flomax   DVT prophylaxis: lovenox Code Status: full Family Communication: Dr. Ashok Pall updated daughter by phone 8/20  Level of care: Med-Surg Status is: inpatient --- Needs SNF/rehab which insurance denied, now under appeal     Consultants:  none  Procedures: none  Antimicrobials:  none    Subjective: Pt up in recliner. Reports he felt better yesterday, nausea this AM.  No BM in 3 days, drank Miralax this AM.  No other acute complaints.  Still hopes to go to rehab.   Objective: Vitals:   06/23/23 1524 06/23/23 2029 06/24/23 0332 06/24/23 0749  BP: (!) 146/73 138/77 130/75 129/77  Pulse: 62 (!) 57 60 (!) 59  Resp: 18 18 16  18  Temp: 98.2 F (36.8 C) 97.9 F (36.6 C) 98.3 F (36.8 C) 97.7 F (36.5 C)  TempSrc:  Oral Oral Oral  SpO2: 96% 97% 97% 96%  Weight:   57.2 kg   Height:        Intake/Output Summary (Last 24 hours) at 06/24/2023 1557 Last data filed at 06/24/2023 0900 Gross per 24 hour  Intake 480 ml  Output 900 ml  Net -420 ml   Filed Weights   06/21/23 0447 06/23/23 0426 06/24/23 0332  Weight: 56.8 kg 56.6 kg 57.2 kg    Examination:  General  exam: awake, alert, no acute distress, chronically ill appearing HEENT: moist mucus membranes, hearing grossly normal  Respiratory system: CTAB, no wheezes, rales or rhonchi, normal respiratory effort. Cardiovascular system: normal S1/S2, RRR, no JVD, murmurs, rubs, gallops, no pedal edema.   Gastrointestinal system: soft, NT, ND, no HSM felt, +bowel sounds. Central nervous system: A&O x3. no gross focal neurologic deficits, normal speech Extremities: contracted RUE chronic stable, no edema, normal tone Skin: dry, intact, normal temperature Psychiatry: normal mood, congruent affect, judgement and insight appear normal     Data Reviewed: I have personally reviewed following labs and imaging studies  CBC: Recent Labs  Lab 06/21/23 0347  WBC 6.3  HGB 14.7  HCT 42.1  MCV 89.6  PLT 136*   Basic Metabolic Panel: Recent Labs  Lab 06/21/23 0347  NA 134*  K 3.6  CL 101  CO2 24  GLUCOSE 107*  BUN 12  CREATININE 0.57*  CALCIUM 8.7*   GFR: Estimated Creatinine Clearance: 67.2 mL/min (A) (by C-G formula based on SCr of 0.57 mg/dL (L)). Liver Function Tests: Recent Labs  Lab 06/21/23 0347  AST 49*  ALT 58*  ALKPHOS 83  BILITOT 0.9  PROT 6.6  ALBUMIN 3.5   No results for input(s): "LIPASE", "AMYLASE" in the last 168 hours.  No results for input(s): "AMMONIA" in the last 168 hours. Coagulation Profile: No results for input(s): "INR", "PROTIME" in the last 168 hours. Cardiac Enzymes: No results for input(s): "CKTOTAL", "CKMB", "CKMBINDEX", "TROPONINI" in the last 168 hours. BNP (last 3 results) No results for input(s): "PROBNP" in the last 8760 hours. HbA1C: No results for input(s): "HGBA1C" in the last 72 hours. CBG: No results for input(s): "GLUCAP" in the last 168 hours. Lipid Profile: No results for input(s): "CHOL", "HDL", "LDLCALC", "TRIG", "CHOLHDL", "LDLDIRECT" in the last 72 hours. Thyroid Function Tests: No results for input(s): "TSH", "T4TOTAL", "FREET4",  "T3FREE", "THYROIDAB" in the last 72 hours. Anemia Panel: No results for input(s): "VITAMINB12", "FOLATE", "FERRITIN", "TIBC", "IRON", "RETICCTPCT" in the last 72 hours. Urine analysis:    Component Value Date/Time   COLORURINE YELLOW (A) 06/16/2023 1718   APPEARANCEUR CLEAR (A) 06/16/2023 1718   LABSPEC 1.016 06/16/2023 1718   PHURINE 6.0 06/16/2023 1718   GLUCOSEU NEGATIVE 06/16/2023 1718   HGBUR NEGATIVE 06/16/2023 1718   BILIRUBINUR NEGATIVE 06/16/2023 1718   KETONESUR NEGATIVE 06/16/2023 1718   PROTEINUR NEGATIVE 06/16/2023 1718   NITRITE NEGATIVE 06/16/2023 1718   LEUKOCYTESUR NEGATIVE 06/16/2023 1718   Sepsis Labs: @LABRCNTIP (procalcitonin:4,lacticidven:4)  ) Recent Results (from the past 240 hour(s))  Resp panel by RT-PCR (RSV, Flu A&B, Covid) Anterior Nasal Swab     Status: None   Collection Time: 06/16/23  4:40 PM   Specimen: Anterior Nasal Swab  Result Value Ref Range Status   SARS Coronavirus 2 by RT PCR NEGATIVE NEGATIVE Final    Comment: (NOTE) SARS-CoV-2 target nucleic acids are NOT  DETECTED.  The SARS-CoV-2 RNA is generally detectable in upper respiratory specimens during the acute phase of infection. The lowest concentration of SARS-CoV-2 viral copies this assay can detect is 138 copies/mL. A negative result does not preclude SARS-Cov-2 infection and should not be used as the sole basis for treatment or other patient management decisions. A negative result may occur with  improper specimen collection/handling, submission of specimen other than nasopharyngeal swab, presence of viral mutation(s) within the areas targeted by this assay, and inadequate number of viral copies(<138 copies/mL). A negative result must be combined with clinical observations, patient history, and epidemiological information. The expected result is Negative.  Fact Sheet for Patients:  BloggerCourse.com  Fact Sheet for Healthcare Providers:   SeriousBroker.it  This test is no t yet approved or cleared by the [place] FDA and  has been authorized for detection and/or diagnosis of SARS-CoV-2 by FDA under an Emergency Use Authorization (EUA). This EUA will remain  in effect (meaning this test can be used) for the duration of the COVID-19 declaration under Section 564(b)(1) of the Act, 21 U.S.C.section 360bbb-3(b)(1), unless the authorization is terminated  or revoked sooner.       Influenza A by PCR NEGATIVE NEGATIVE Final   Influenza B by PCR NEGATIVE NEGATIVE Final    Comment: (NOTE) The Xpert Xpress SARS-CoV-2/FLU/RSV plus assay is intended as an aid in the diagnosis of influenza from Nasopharyngeal swab specimens and should not be used as a sole basis for treatment. Nasal washings and aspirates are unacceptable for Xpert Xpress SARS-CoV-2/FLU/RSV testing.  Fact Sheet for Patients: BloggerCourse.com  Fact Sheet for Healthcare Providers: SeriousBroker.it  This test is not yet approved or cleared by the [place] FDA and has been authorized for detection and/or diagnosis of SARS-CoV-2 by FDA under an Emergency Use Authorization (EUA). This EUA will remain in effect (meaning this test can be used) for the duration of the COVID-19 declaration under Section 564(b)(1) of the Act, 21 U.S.C. section 360bbb-3(b)(1), unless the authorization is terminated or revoked.     Resp Syncytial Virus by PCR NEGATIVE NEGATIVE Final    Comment: (NOTE) Fact Sheet for Patients: BloggerCourse.com  Fact Sheet for Healthcare Providers: SeriousBroker.it  This test is not yet approved or cleared by the [place] FDA and has been authorized for detection and/or diagnosis of SARS-CoV-2 by FDA under an Emergency Use Authorization (EUA). This EUA will remain in effect (meaning this test can be used) for  the duration of the COVID-19 declaration under Section 564(b)(1) of the Act, 21 U.S.C. section 360bbb-3(b)(1), unless the authorization is terminated or revoked.  Performed at [facility], [address] [place], [place] [address]   Culture, blood (x 2)     Status: None   Collection Time: 06/16/23 10:27 PM   Specimen: BLOOD  Result Value Ref Range Status   Specimen Description BLOOD BLOOD LEFT HAND  Final   Special Requests   Final    BOTTLES DRAWN AEROBIC AND ANAEROBIC Blood Culture adequate volume   Culture   Final    NO GROWTH 5 DAYS Performed at [facility], [address] [place], [place] [address]    Report Status 06/21/2023 FINAL  Final  Culture, blood (x 2)     Status: Abnormal   Collection Time: 06/16/23 10:34 PM   Specimen: BLOOD  Result Value Ref Range Status   Specimen Description   Final    BLOOD BLOOD RIGHT HAND Performed at [facility], [address] [place], [place] [address]    Special Requests   Final    BOTTLES DRAWN AEROBIC AND ANAEROBIC Blood Culture adequate volume Performed at [facility], [address] [place], [place] [address]    Culture  Setup Time   Final    IN BOTH AEROBIC AND ANAEROBIC BOTTLES GRAM POSITIVE COCCI CRITICAL RESULT CALLED TO, READ BACK BY AND VERIFIED WITH: ALEX CHAPPELL 06/17/23 1516 KLW    Culture (A)  Final    STAPHYLOCOCCUS HOMINIS STAPHYLOCOCCUS EPIDERMIDIS THE SIGNIFICANCE OF ISOLATING THIS ORGANISM FROM A SINGLE SET OF BLOOD CULTURES WHEN MULTIPLE SETS ARE DRAWN IS UNCERTAIN. PLEASE NOTIFY THE MICROBIOLOGY DEPARTMENT WITHIN ONE WEEK IF SPECIATION AND SENSITIVITIES ARE REQUIRED. Performed at [facility], [address], [place], [place] [address]    Report Status 06/19/2023 FINAL  Final  Blood Culture ID Panel (Reflexed)     Status: Abnormal   Collection Time: 06/16/23 10:34 PM  Result Value Ref Range Status   Enterococcus faecalis NOT DETECTED NOT DETECTED Final    Enterococcus Faecium NOT DETECTED NOT DETECTED Final   Listeria monocytogenes NOT DETECTED NOT DETECTED Final   Staphylococcus species DETECTED (A) NOT DETECTED Final    Comment: CRITICAL RESULT CALLED TO, READ BACK BY AND VERIFIED WITH: ALEX CHAPPELL 06/17/23 1516 KLW    Staphylococcus aureus (BCID) NOT DETECTED NOT DETECTED Final   Staphylococcus epidermidis DETECTED (A) NOT DETECTED Final    Comment: Methicillin (oxacillin) resistant coagulase negative staphylococcus. Possible blood culture contaminant (unless isolated from more than one blood culture draw or clinical case suggests pathogenicity). No antibiotic treatment is indicated for blood  culture contaminants. CRITICAL RESULT CALLED TO, READ BACK BY AND VERIFIED WITH: ALEX CHAPPELL 06/17/23 1516 KLW    Staphylococcus lugdunensis NOT DETECTED NOT DETECTED Final   Streptococcus species NOT DETECTED NOT DETECTED Final   Streptococcus agalactiae NOT DETECTED NOT DETECTED Final   Streptococcus pneumoniae NOT DETECTED NOT DETECTED Final   Streptococcus pyogenes NOT DETECTED NOT DETECTED Final   A.calcoaceticus-baumannii NOT DETECTED NOT DETECTED Final   Bacteroides fragilis NOT DETECTED NOT DETECTED Final   Enterobacterales NOT DETECTED NOT DETECTED Final   Enterobacter cloacae complex NOT DETECTED NOT DETECTED Final   Escherichia coli NOT DETECTED NOT DETECTED Final   Klebsiella aerogenes NOT DETECTED NOT DETECTED Final   Klebsiella oxytoca NOT DETECTED NOT DETECTED Final   Klebsiella pneumoniae NOT DETECTED NOT DETECTED Final   Proteus species NOT DETECTED NOT DETECTED Final   Salmonella species NOT DETECTED NOT DETECTED Final   Serratia marcescens NOT DETECTED NOT DETECTED Final   Haemophilus influenzae NOT DETECTED NOT DETECTED Final   Neisseria meningitidis NOT DETECTED NOT DETECTED Final   Pseudomonas aeruginosa NOT DETECTED NOT DETECTED Final   Stenotrophomonas maltophilia NOT DETECTED NOT DETECTED Final   Candida albicans  NOT DETECTED NOT DETECTED Final   Candida auris NOT DETECTED NOT DETECTED Final   Candida glabrata NOT DETECTED NOT DETECTED Final   Candida krusei NOT DETECTED NOT DETECTED Final   Candida parapsilosis NOT DETECTED NOT DETECTED Final   Candida tropicalis NOT DETECTED NOT DETECTED Final   Cryptococcus neoformans/gattii NOT DETECTED NOT DETECTED Final   Methicillin resistance mecA/C DETECTED (A) NOT DETECTED Final    Comment: CRITICAL RESULT CALLED TO, READ BACK BY AND VERIFIED WITH: ALEX CHAPPELL 06/17/23 1516 KLW Performed at [facility], [address], [place], [place] [address]          Radiology Studies: No results found.  Scheduled Meds:  atenolol  25 mg Oral Daily   atorvastatin  10 mg Oral Daily   Chlorhexidine Gluconate Cloth  6 each Topical Q0600   enoxaparin (LOVENOX) injection  40 mg Subcutaneous Q24H   feeding supplement  237 mL Oral BID BM   folic acid  1 mg Oral Daily   melatonin  5 mg Oral QHS   multivitamin with minerals  1 tablet Oral Daily   pantoprazole  40 mg Oral BID   polyethylene glycol  34 g Oral Daily   senna-docusate  1 tablet Oral BID   sodium chloride flush  10-40 mL Intracatheter Q12H   tamsulosin  0.4 mg Oral Daily   Continuous Infusions:  sodium chloride 10 mL/hr at 06/20/23 1840      LOS: 7 days     Pennie Banter, DO [facility]   If 7PM-7AM, please contact night-coverage [email or web address] Password [facility] 06/24/2023, 3:57 PM

## 2023-06-24 NOTE — Progress Notes (Signed)
Physical Therapy Treatment Patient Details Name: Douglas Peterson. MRN: [id number] DOB: [birth year] Today's Date: 06/24/2023   History of Present Illness 72 y.o. 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     Patient states that he has nausea, vomiting, abdominal pain in the past 3 days.  He has several episodes of nonbilious nonbloody vomiting.  Denies diarrhea.  His abdominal pain is located in central and upper abdomen, intermittent, mild to moderate, aching, nonradiating.  Pt was here for similar 8/11.    PT Comments  Pt was sitting in recliner, finishing breakfast upon arrival. He is A and O and seems to be at his baseline cognition (pt is well known by author from previous admissions). Pt was able to stand and ambulate with SPC but continues to have instability and several occasions of LOB with 1x with intervention to prevent fall. Pt tolerated ambulation well but remains high fall risk. Acute PT will continue to follow and progress per current POC. Pt will benefit from continued skilled PT at DC to  maximize independence and safety with all ADLs.DC recs remian appropriate. If unable (insurance denial), highly recommend as much HH services as able.     If plan is discharge home, recommend the following: Assistance with cooking/housework;Direct supervision/assist for medications management;Direct supervision/assist for financial management;Assist for transportation;Help with stairs or ramp for entrance;Supervision due to cognitive status;A little help with walking and/or transfers;A little help with bathing/dressing/bathroom     Equipment Recommendations  None recommended by PT (pt has SPC at home)       Precautions / Restrictions  Precautions Precautions: Fall Restrictions Weight Bearing Restrictions: No     Mobility  Bed Mobility  General bed mobility comments: In recliner pre/post sesssion    Transfers Overall transfer level: Needs assistance Equipment used: Straight cane Transfers: Sit to/from Stand Sit to Stand: Contact guard assist  General transfer comment: CGA for safety    Ambulation/Gait Ambulation/Gait assistance: Contact guard assist Gait Distance (Feet): 200 Feet Assistive device: Straight cane  Gait velocity: decreased General Gait Details: Pt was easily ambulated 200 ft with SPC    Balance Overall balance assessment: Needs assistance Sitting-balance support: Single extremity supported, Feet supported Sitting balance-Leahy Scale: Good     Standing balance support: Single extremity supported, During functional activity, Reliant on assistive device for balance Standing balance-Leahy Scale: Poor Standing balance comment: pt remians high fall risk     Cognition Arousal: Alert Behavior During Therapy: WFL for tasks assessed/performed Overall Cognitive Status: Within Functional Limits for tasks assessed    General Comments: Pt is A and O x 4. Seems to be at baseline cognition.               Pertinent Vitals/Pain Pain Assessment Pain Assessment: No/denies pain     PT Goals (current goals can now be found in the care plan section) Acute Rehab PT Goals Patient Stated Goal: go home Progress towards PT goals: Progressing toward goals    Frequency    Min 1X/week      PT Plan Current plan remains appropriate       AM-PAC PT "6 Clicks" Mobility   Outcome Measure  Help needed turning from your back to your side while in a flat bed without using bedrails?: None Help needed  moving from lying on your back to sitting on the side of a flat bed without using bedrails?: A Little Help needed moving to and from a bed to a chair (including a wheelchair)?: A Little Help needed  standing up from a chair using your arms (e.g., wheelchair or bedside chair)?: A Little Help needed to walk in hospital room?: A Little Help needed climbing 3-5 steps with a railing? : A Little 6 Click Score: 19    End of Session   Activity Tolerance: Patient tolerated treatment well Patient left: in chair;with call bell/phone within reach;with chair alarm set Nurse Communication: Mobility status PT Visit Diagnosis: Unsteadiness on feet (R26.81);Difficulty in walking, not elsewhere classified (R26.2);Muscle weakness (generalized) (M62.81)     Time: 0981-1914 PT Time Calculation (min) (ACUTE ONLY): 16 min  Charges:    $Gait Training: 8-22 mins PT General Charges $$ ACUTE PT VISIT: 1 Visit                     Jetta Lout PTA 06/24/23, 9:00 AM

## 2023-06-24 NOTE — Plan of Care (Signed)

## 2023-06-25 DIAGNOSIS — I10 Essential (primary) hypertension: Secondary | ICD-10-CM | POA: Diagnosis not present

## 2023-06-25 DIAGNOSIS — I5032 Chronic diastolic (congestive) heart failure: Secondary | ICD-10-CM | POA: Diagnosis not present

## 2023-06-25 DIAGNOSIS — F1020 Alcohol dependence, uncomplicated: Secondary | ICD-10-CM | POA: Diagnosis not present

## 2023-06-25 DIAGNOSIS — I480 Paroxysmal atrial fibrillation: Secondary | ICD-10-CM | POA: Diagnosis not present

## 2023-06-25 NOTE — Plan of Care (Signed)
  Problem: Health Behavior/Discharge Planning: Goal: Ability to manage health-related needs will improve Outcome: Progressing   Problem: Clinical Measurements: Goal: Ability to maintain clinical measurements within normal limits will improve Outcome: Progressing   Problem: Nutrition: Goal: Adequate nutrition will be maintained Outcome: Progressing   Problem: Coping: Goal: Level of anxiety will decrease Outcome: Progressing   Problem: Skin Integrity: Goal: Risk for impaired skin integrity will decrease Outcome: Progressing

## 2023-06-25 NOTE — TOC Progression Note (Signed)
Transition of Care (TOC) - Progression Note    Patient Details  Name: Douglas Peterson. MRN: [id number] Date of Birth: August 27, 1951  Transition of Care [facility]) CM/SW Contact  [name], RN Phone Number: 06/25/2023, 3:23 PM  Clinical Narrative:      Almira Coaster at [facility] to call [employer] for status update on appeal       Expected Discharge Plan and Services                                               Social Determinants of Health (SDOH) Interventions SDOH Screenings   Food Insecurity: No Food Insecurity (06/16/2023)  Housing: Low Risk  (06/16/2023)  Transportation Needs: No Transportation Needs (06/16/2023)  Utilities: Not At Risk (06/16/2023)  Financial Resource Strain: Unknown (06/13/2019)  Physical Activity: Unknown (06/13/2019)  Social Connections: Unknown (06/13/2019)  Stress: Unknown (06/13/2019)  Tobacco Use: Medium Risk (06/16/2023)    Readmission Risk Interventions    06/17/2023    4:23 PM 04/29/2022   10:26 AM 03/14/2022   12:48 PM  Readmission Risk Prevention Plan  Transportation Screening Complete Complete Complete  PCP or Specialist Appt within 3-5 Days   Complete  HRI or Home Care Consult Complete Complete Complete  Social Work Consult for Recovery Care Planning/Counseling Complete Complete Complete  Palliative Care Screening Not Applicable Not Applicable Not Applicable  Medication Review Oceanographer) Complete Complete Complete

## 2023-06-25 NOTE — Care Management Important Message (Signed)
Important Message  Patient Details  Name: Douglas Peterson. MRN: [id number] Date of Birth: 08/22/1951   Medicare Important Message Given:  Yes     [name] 06/25/2023, 1:30 PM

## 2023-06-25 NOTE — Progress Notes (Addendum)
PROGRESS NOTE    Douglas Peterson.  JJO:[id number] DOB: Jul 04, 1951 DOA: 06/16/2023 PCP: Melonie Florida, FNP      Brief Narrative:  HPI on admission: "Past medical history of chronic alcohol use, atrial fibrillation not on anticoagulation, hyponatremia, TBI, who presents to the emergency department with multiple symptoms.   He notes that he is continuing to have abdominal discomfort and nausea and vomiting.  He feels fatigued.  He has shortness of breath.  He has had a cough and feels like his chest is congested.  He denies fever or chest pain.  He denies abdominal pain or GU symptoms.   He continues to drink a significant amount of alcohol and his last drink was just prior to arrival.  He denies drug use.   When I ask him to clarify why he was here in the emergency department despite being evaluated 2 days ago, he notes that his cough is worse and his breathing is worse and all other symptoms are chronic and ongoing and unchanged."  Further hospital course and management as outlined below.  In summary, pt's abdominal pain improved with supportive care as outlined. Delirium resolved and patient's mentation returned to baseline. SNF was recommended for rehab but declined by insurance.  This denial is being appealed. Pt continues to work with PT and mobility techs while in hospital.   Mobility appears to be progressing well. Pt is non-compliant with dysphagia diet as recommended, requests normal diet and seems to tolerate this.  No reports of aspiration episodes from pt or nursing staff.   Assessment & Plan:   Principal Problem:   Abdominal pain Active Problems:   Alcohol use disorder, severe, dependence (HCC)   SIRS (systemic inflammatory response syndrome) (HCC)   Elevated lactic acid level   GERD (gastroesophageal reflux disease)   Chronic diastolic CHF (congestive heart failure) (HCC)   Paroxysmal A-fib (HCC)   Dyslipidemia   Essential hypertension   BPH (benign prostatic  hyperplasia)   Left inguinal hernia  # Abdominal pain # Nausea - intermittent, suspect chronic Resolved. CT abd/pelvis 8/11 negative. Normal labs here. Suspect gastritis from alcohol abuse - continue PPI - anti-emetics PRN  # Lactic acidosis 2/2 alcohol intoxication, resolved with fluids. No s/s infection  # Staph epi & hominis positive blood culture In one of two bottles, clinically relatively low suspicion for bacteremia - continue to follow clinically  # Dysphagia Severe, SLP advising NPO and feeding tube but patient previously declined, aware of risks - dysphagia diet (ND-3) - pt requested to resume normal diet - ordered after repeat discussion of risks  # Alcohol abuse # Hospital delirium No withdrawal symptoms. Delirium appears resolved. - continue ciwa lorazepam - will stop tomorrow if scores remain low - vitamins - now s/p high dose thiamine  # Debility PT advising SNF, patient initially hesitant but now says he doesn't feel safe going home and requests SNF - TOC consulted, [facility] accepted, pending insurance auth - insurance declined, peer to peer performed 8/19, still declined, patient now planning appeal - daughter says can go to [facility] in , was there before, will let TOC know  # A-fib, paroxysmal Rate controlled, not on anticoagulant - continue coreg  # HFpEF Euvolemic  # HTN Controlled - cont home coreg  # BPH - cont home flomax   DVT prophylaxis: lovenox Code Status: full Family Communication: Dr. Ashok Pall updated daughter by phone 8/20  Level of care: Med-Surg Status is: inpatient --- Needs SNF/rehab which  insurance denied, now under appeal     Consultants:  none  Procedures: none  Antimicrobials:  none    Subjective: Pt up in recliner. Reports PT brought him shoes.  He's had pain ambulating on hard floors without shoes, esp at the right first MTP joint. Reports remote injury to that area and having arthritis.   Doesn't feel hot and not red.  Intermittent mild nausea but not vomiting, tolerating meals.   Objective: Vitals:   06/24/23 0749 06/24/23 2015 06/25/23 0335 06/25/23 0945  BP: 129/77 (!) 140/76 122/67 (!) 152/88  Pulse: (!) 59 69 (!) 58 80  Resp: 18 18 18 18   Temp: 97.7 F (36.5 C) 98.6 F (37 C) 97.9 F (36.6 C) 97.8 F (36.6 C)  TempSrc: Oral Oral Oral Oral  SpO2: 96% 93% 95% 95%  Weight:   58 kg   Height:        Intake/Output Summary (Last 24 hours) at 06/25/2023 1635 Last data filed at 06/25/2023 1100 Gross per 24 hour  Intake --  Output 400 ml  Net -400 ml   Filed Weights   06/23/23 0426 06/24/23 0332 06/25/23 0335  Weight: 56.6 kg 57.2 kg 58 kg    Examination:  General exam: awake, alert, no acute distress, chronically ill appearing Respiratory system: on room air, normal respiratory effort. Cardiovascular system: RRR, intact pedal pulses, no edema Gastrointestinal system: soft, NT, ND Central nervous system: A&O x3. no gross focal neurologic deficits, normal speech Extremities: contracted RUE chronic stable, no edema, normal tone Skin: dry, intact, normal temperature Psychiatry: normal mood, congruent affect    Data Reviewed: I have personally reviewed following labs and imaging studies  CBC: Recent Labs  Lab 06/21/23 0347  WBC 6.3  HGB 14.7  HCT 42.1  MCV 89.6  PLT 136*   Basic Metabolic Panel: Recent Labs  Lab 06/21/23 0347  NA 134*  K 3.6  CL 101  CO2 24  GLUCOSE 107*  BUN 12  CREATININE 0.57*  CALCIUM 8.7*   GFR: Estimated Creatinine Clearance: 67.2 mL/min (A) (by C-G formula based on SCr of 0.57 mg/dL (L)). Liver Function Tests: Recent Labs  Lab 06/21/23 0347  AST 49*  ALT 58*  ALKPHOS 83  BILITOT 0.9  PROT 6.6  ALBUMIN 3.5   No results for input(s): "LIPASE", "AMYLASE" in the last 168 hours.  No results for input(s): "AMMONIA" in the last 168 hours. Coagulation Profile: No results for input(s): "INR", "PROTIME" in the  last 168 hours. Cardiac Enzymes: No results for input(s): "CKTOTAL", "CKMB", "CKMBINDEX", "TROPONINI" in the last 168 hours. BNP (last 3 results) No results for input(s): "PROBNP" in the last 8760 hours. HbA1C: No results for input(s): "HGBA1C" in the last 72 hours. CBG: No results for input(s): "GLUCAP" in the last 168 hours. Lipid Profile: No results for input(s): "CHOL", "HDL", "LDLCALC", "TRIG", "CHOLHDL", "LDLDIRECT" in the last 72 hours. Thyroid Function Tests: No results for input(s): "TSH", "T4TOTAL", "FREET4", "T3FREE", "THYROIDAB" in the last 72 hours. Anemia Panel: No results for input(s): "VITAMINB12", "FOLATE", "FERRITIN", "TIBC", "IRON", "RETICCTPCT" in the last 72 hours. Urine analysis:    Component Value Date/Time   COLORURINE YELLOW (A) 06/16/2023 1718   APPEARANCEUR CLEAR (A) 06/16/2023 1718   LABSPEC 1.016 06/16/2023 1718   PHURINE 6.0 06/16/2023 1718   GLUCOSEU NEGATIVE 06/16/2023 1718   HGBUR NEGATIVE 06/16/2023 1718   BILIRUBINUR NEGATIVE 06/16/2023 1718   KETONESUR NEGATIVE 06/16/2023 1718   PROTEINUR NEGATIVE 06/16/2023 1718   NITRITE NEGATIVE 06/16/2023  1718   LEUKOCYTESUR NEGATIVE 06/16/2023 1718   Sepsis Labs: @LABRCNTIP (procalcitonin:4,lacticidven:4)  ) Recent Results (from the past 240 hour(s))  Resp panel by RT-PCR (RSV, Flu A&B, Covid) Anterior Nasal Swab     Status: None   Collection Time: 06/16/23  4:40 PM   Specimen: Anterior Nasal Swab  Result Value Ref Range Status   SARS Coronavirus 2 by RT PCR NEGATIVE NEGATIVE Final    Comment: (NOTE) SARS-CoV-2 target nucleic acids are NOT DETECTED.  The SARS-CoV-2 RNA is generally detectable in upper respiratory specimens during the acute phase of infection. The lowest concentration of SARS-CoV-2 viral copies this assay can detect is 138 copies/mL. A negative result does not preclude SARS-Cov-2 infection and should not be used as the sole basis for treatment or other patient management  decisions. A negative result may occur with  improper specimen collection/handling, submission of specimen other than nasopharyngeal swab, presence of viral mutation(s) within the areas targeted by this assay, and inadequate number of viral copies(<138 copies/mL). A negative result must be combined with clinical observations, patient history, and epidemiological information. The expected result is Negative.  Fact Sheet for Patients:  BloggerCourse.com  Fact Sheet for Healthcare Providers:  SeriousBroker.it  This test is no t yet approved or cleared by the [place] FDA and  has been authorized for detection and/or diagnosis of SARS-CoV-2 by FDA under an Emergency Use Authorization (EUA). This EUA will remain  in effect (meaning this test can be used) for the duration of the COVID-19 declaration under Section 564(b)(1) of the Act, 21 U.S.C.section 360bbb-3(b)(1), unless the authorization is terminated  or revoked sooner.       Influenza A by PCR NEGATIVE NEGATIVE Final   Influenza B by PCR NEGATIVE NEGATIVE Final    Comment: (NOTE) The Xpert Xpress SARS-CoV-2/FLU/RSV plus assay is intended as an aid in the diagnosis of influenza from Nasopharyngeal swab specimens and should not be used as a sole basis for treatment. Nasal washings and aspirates are unacceptable for Xpert Xpress SARS-CoV-2/FLU/RSV testing.  Fact Sheet for Patients: BloggerCourse.com  Fact Sheet for Healthcare Providers: SeriousBroker.it  This test is not yet approved or cleared by the [place] FDA and has been authorized for detection and/or diagnosis of SARS-CoV-2 by FDA under an Emergency Use Authorization (EUA). This EUA will remain in effect (meaning this test can be used) for the duration of the COVID-19 declaration under Section 564(b)(1) of the Act, 21 U.S.C. section 360bbb-3(b)(1), unless the  authorization is terminated or revoked.     Resp Syncytial Virus by PCR NEGATIVE NEGATIVE Final    Comment: (NOTE) Fact Sheet for Patients: BloggerCourse.com  Fact Sheet for Healthcare Providers: SeriousBroker.it  This test is not yet approved or cleared by the [place] FDA and has been authorized for detection and/or diagnosis of SARS-CoV-2 by FDA under an Emergency Use Authorization (EUA). This EUA will remain in effect (meaning this test can be used) for the duration of the COVID-19 declaration under Section 564(b)(1) of the Act, 21 U.S.C. section 360bbb-3(b)(1), unless the authorization is terminated or revoked.  Performed at [facility], [address] [place], [place] [address]   Culture, blood (x 2)     Status: None   Collection Time: 06/16/23 10:27 PM   Specimen: BLOOD  Result Value Ref Range Status   Specimen Description BLOOD BLOOD LEFT HAND  Final   Special Requests   Final    BOTTLES DRAWN AEROBIC AND ANAEROBIC Blood Culture adequate volume  Culture   Final    NO GROWTH 5 DAYS Performed at [facility], [address] [place], [place] [address]    Report Status 06/21/2023 FINAL  Final  Culture, blood (x 2)     Status: Abnormal   Collection Time: 06/16/23 10:34 PM   Specimen: BLOOD  Result Value Ref Range Status   Specimen Description   Final    BLOOD BLOOD RIGHT HAND Performed at [facility], [address], [place], [place] [address]    Special Requests   Final    BOTTLES DRAWN AEROBIC AND ANAEROBIC Blood Culture adequate volume Performed at [facility], [address] [place], [place] [address]    Culture  Setup Time   Final    IN BOTH AEROBIC AND ANAEROBIC BOTTLES GRAM POSITIVE COCCI CRITICAL RESULT CALLED TO, READ BACK BY AND VERIFIED WITH: ALEX CHAPPELL 06/17/23 1516 KLW    Culture (A)  Final    STAPHYLOCOCCUS HOMINIS STAPHYLOCOCCUS  EPIDERMIDIS THE SIGNIFICANCE OF ISOLATING THIS ORGANISM FROM A SINGLE SET OF BLOOD CULTURES WHEN MULTIPLE SETS ARE DRAWN IS UNCERTAIN. PLEASE NOTIFY THE MICROBIOLOGY DEPARTMENT WITHIN ONE WEEK IF SPECIATION AND SENSITIVITIES ARE REQUIRED. Performed at [facility], [address] [place], [place] [address]    Report Status 06/19/2023 FINAL  Final  Blood Culture ID Panel (Reflexed)     Status: Abnormal   Collection Time: 06/16/23 10:34 PM  Result Value Ref Range Status   Enterococcus faecalis NOT DETECTED NOT DETECTED Final   Enterococcus Faecium NOT DETECTED NOT DETECTED Final   Listeria monocytogenes NOT DETECTED NOT DETECTED Final   Staphylococcus species DETECTED (A) NOT DETECTED Final    Comment: CRITICAL RESULT CALLED TO, READ BACK BY AND VERIFIED WITH: ALEX CHAPPELL 06/17/23 1516 KLW    Staphylococcus aureus (BCID) NOT DETECTED NOT DETECTED Final   Staphylococcus epidermidis DETECTED (A) NOT DETECTED Final    Comment: Methicillin (oxacillin) resistant coagulase negative staphylococcus. Possible blood culture contaminant (unless isolated from more than one blood culture draw or clinical case suggests pathogenicity). No antibiotic treatment is indicated for blood  culture contaminants. CRITICAL RESULT CALLED TO, READ BACK BY AND VERIFIED WITH: ALEX CHAPPELL 06/17/23 1516 KLW    Staphylococcus lugdunensis NOT DETECTED NOT DETECTED Final   Streptococcus species NOT DETECTED NOT DETECTED Final   Streptococcus agalactiae NOT DETECTED NOT DETECTED Final   Streptococcus pneumoniae NOT DETECTED NOT DETECTED Final   Streptococcus pyogenes NOT DETECTED NOT DETECTED Final   A.calcoaceticus-baumannii NOT DETECTED NOT DETECTED Final   Bacteroides fragilis NOT DETECTED NOT DETECTED Final   Enterobacterales NOT DETECTED NOT DETECTED Final   Enterobacter cloacae complex NOT DETECTED NOT DETECTED Final   Escherichia coli NOT DETECTED NOT DETECTED Final   Klebsiella aerogenes NOT DETECTED NOT  DETECTED Final   Klebsiella oxytoca NOT DETECTED NOT DETECTED Final   Klebsiella pneumoniae NOT DETECTED NOT DETECTED Final   Proteus species NOT DETECTED NOT DETECTED Final   Salmonella species NOT DETECTED NOT DETECTED Final   Serratia marcescens NOT DETECTED NOT DETECTED Final   Haemophilus influenzae NOT DETECTED NOT DETECTED Final   Neisseria meningitidis NOT DETECTED NOT DETECTED Final   Pseudomonas aeruginosa NOT DETECTED NOT DETECTED Final   Stenotrophomonas maltophilia NOT DETECTED NOT DETECTED Final   Candida albicans NOT DETECTED NOT DETECTED Final   Candida auris NOT DETECTED NOT DETECTED Final   Candida glabrata NOT DETECTED NOT DETECTED Final   Candida krusei NOT DETECTED NOT DETECTED Final   Candida parapsilosis  NOT DETECTED NOT DETECTED Final   Candida tropicalis NOT DETECTED NOT DETECTED Final   Cryptococcus neoformans/gattii NOT DETECTED NOT DETECTED Final   Methicillin resistance mecA/C DETECTED (A) NOT DETECTED Final    Comment: CRITICAL RESULT CALLED TO, READ BACK BY AND VERIFIED WITH: ALEX CHAPPELL 06/17/23 1516 KLW Performed at [facility], [address] [place], [place] [address]          Radiology Studies: No results found.      Scheduled Meds:  atenolol  25 mg Oral Daily   atorvastatin  10 mg Oral Daily   Chlorhexidine Gluconate Cloth  6 each Topical Q0600   enoxaparin (LOVENOX) injection  40 mg Subcutaneous Q24H   feeding supplement  237 mL Oral BID BM   folic acid  1 mg Oral Daily   melatonin  5 mg Oral QHS   multivitamin with minerals  1 tablet Oral Daily   pantoprazole  40 mg Oral BID   polyethylene glycol  34 g Oral Daily   senna-docusate  1 tablet Oral BID   sodium chloride flush  10-40 mL Intracatheter Q12H   tamsulosin  0.4 mg Oral Daily   Continuous Infusions:  sodium chloride 10 mL/hr at 06/20/23 1840      LOS: 8 days     Pennie Banter, DO [facility]   If 7PM-7AM, please contact  night-coverage [email or web address] Password [facility] 06/25/2023, 4:35 PM

## 2023-06-25 NOTE — Progress Notes (Signed)
Physical Therapy Treatment Patient Details Name: Douglas Peterson. MRN: [id number] DOB: Mar 25, 1951 Today's Date: 06/25/2023   History of Present Illness 72 y.o. 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     Patient states that he has nausea, vomiting, abdominal pain in the past 3 days.  He has several episodes of nonbilious nonbloody vomiting.  Denies diarrhea.  His abdominal pain is located in central and upper abdomen, intermittent, mild to moderate, aching, nonradiating.  Pt was here for similar 8/11.    PT Comments  Pt was sitting in recliner upon arrival. He is A and O x 4. Was able to demonstrate safe abilities to stand and ambulate with SPC. Performed ascending/descending stairs without difficulty. Pt c/o R foot plantar surface pain. Painful to palpate. " It doesn't usually hurt if I wear shoes." Pt is progressing towards balance (compared to previous admissions). Acute PT will continue to follow and progress per current POC.    If plan is discharge home, recommend the following: Assistance with cooking/housework;Direct supervision/assist for medications management;Direct supervision/assist for financial management;Assist for transportation;Help with stairs or ramp for entrance;Supervision due to cognitive status;A little help with walking and/or transfers;A little help with bathing/dressing/bathroom     Equipment Recommendations  None recommended by PT       Precautions / Restrictions Precautions Precautions: Fall Restrictions Weight Bearing Restrictions: No     Mobility  Bed Mobility  General bed mobility comments: In recliner pre/post sesssion    Transfers Overall transfer level: Needs assistance Equipment used:  Straight cane Transfers: Sit to/from Stand Sit to Stand: Supervision   Ambulation/Gait Ambulation/Gait assistance: Supervision, Contact guard assist Gait Distance (Feet): 300 Feet Assistive device: Straight cane Gait Pattern/deviations: Step-through pattern, Staggering left, Staggering right Gait velocity: decreased  General Gait Details: Pt was easily ambulated 300 ft with SPC however does have occasional stagger. no intervention for LOB. Pt c/o severe pain on plantar surface of RLE. " It doesnt usually hurt that bad when I wear shoes."   Stairs Stairs: Yes Stairs assistance: Contact guard assist, Supervision Stair Management: One rail Left, Alternating pattern, Step to pattern Number of Stairs: 12 General stair comments: Pt was encouraged to perform step to pattern versus alternating due to R foot pain.    Balance Overall balance assessment: Needs assistance Sitting-balance support: Single extremity supported, Feet supported Sitting balance-Leahy Scale: Good     Standing balance support: Single extremity supported, During functional activity, Reliant on assistive device for balance Standing balance-Leahy Scale: Fair Standing balance comment: pt demonstrated imporved balance without intervention today       Cognition Arousal: Alert Behavior During Therapy: WFL for tasks assessed/performed Overall Cognitive Status: Within Functional Limits for tasks assessed    General Comments: Pt is A and O x 4. Seems to be at baseline cognition.               Pertinent Vitals/Pain Pain Assessment Pain Assessment: 0-10 Pain Score: 4  Pain Location: plantar surface of foot with wt bearing Pain Descriptors / Indicators: Discomfort, Grimacing Pain Intervention(s): Limited activity within patient's tolerance, Monitored during session     PT Goals (current goals can now be found in the care plan section) Acute Rehab PT Goals Patient Stated Goal: go home Progress towards PT goals:  Progressing  toward goals    Frequency    Min 1X/week      PT Plan Current plan remains appropriate       AM-PAC PT "6 Clicks" Mobility   Outcome Measure  Help needed turning from your back to your side while in a flat bed without using bedrails?: None Help needed moving from lying on your back to sitting on the side of a flat bed without using bedrails?: A Little Help needed moving to and from a bed to a chair (including a wheelchair)?: A Little Help needed standing up from a chair using your arms (e.g., wheelchair or bedside chair)?: A Little Help needed to walk in hospital room?: A Little Help needed climbing 3-5 steps with a railing? : A Little 6 Click Score: 19    End of Session   Activity Tolerance: Patient tolerated treatment well Patient left: in chair;with call bell/phone within reach;with chair alarm set Nurse Communication: Mobility status PT Visit Diagnosis: Unsteadiness on feet (R26.81);Difficulty in walking, not elsewhere classified (R26.2);Muscle weakness (generalized) (M62.81)     Time: 8657-8469 PT Time Calculation (min) (ACUTE ONLY): 28 min  Charges:    $Gait Training: 8-22 mins $Therapeutic Activity: 8-22 mins PT General Charges $$ ACUTE PT VISIT: 1 Visit                    Jetta Lout PTA 06/25/23, 8:38 AM

## 2023-06-25 NOTE — Progress Notes (Signed)
Mobility Specialist - Progress Note   06/25/23 1015  Mobility  Activity Ambulated with assistance in hallway  Level of Assistance Standby assist, set-up cues, supervision of patient - no hands on  Assistive Device Cane  Distance Ambulated (ft) 800 ft  Activity Response Tolerated well  $Mobility charge 1 Mobility     Pt sitting in recliner upon arrival, utilizing RA. Pt motivated for activity. Able to don shoes without physical assist. Ambulated in hallway with minG. No LOB. Pt reports feeling better today than previous day. Pt left in chair with needs in reach. Pt asking to turn alarm off, RN approved with pt aware to utilize call bell for OOB assistance.   [name] Mobility Specialist 06/25/23, 10:18 AM

## 2023-06-25 NOTE — TOC Progression Note (Signed)
Transition of Care (TOC) - Progression Note    Patient Details  Name: Douglas Peterson. MRN: [id number] Date of Birth: 1951-01-05  Transition of Care [facility]) CM/SW Contact  [name], RN Phone Number: 06/25/2023, 8:59 AM  Clinical Narrative:    Appeal of insurance approval for SNF pending         Expected Discharge Plan and Services                                               Social Determinants of Health (SDOH) Interventions SDOH Screenings   Food Insecurity: No Food Insecurity (06/16/2023)  Housing: Low Risk  (06/16/2023)  Transportation Needs: No Transportation Needs (06/16/2023)  Utilities: Not At Risk (06/16/2023)  Financial Resource Strain: Unknown (06/13/2019)  Physical Activity: Unknown (06/13/2019)  Social Connections: Unknown (06/13/2019)  Stress: Unknown (06/13/2019)  Tobacco Use: Medium Risk (06/16/2023)    Readmission Risk Interventions    06/17/2023    4:23 PM 04/29/2022   10:26 AM 03/14/2022   12:48 PM  Readmission Risk Prevention Plan  Transportation Screening Complete Complete Complete  PCP or Specialist Appt within 3-5 Days   Complete  HRI or Home Care Consult Complete Complete Complete  Social Work Consult for Recovery Care Planning/Counseling Complete Complete Complete  Palliative Care Screening Not Applicable Not Applicable Not Applicable  Medication Review Oceanographer) Complete Complete Complete

## 2023-06-25 NOTE — Plan of Care (Signed)
°  Problem: Education: °Goal: Knowledge of General Education information will improve °Description: Including pain rating scale, medication(s)/side effects and non-pharmacologic comfort measures °Outcome: Progressing °  °Problem: Health Behavior/Discharge Planning: °Goal: Ability to manage health-related needs will improve °Outcome: Progressing °  °Problem: Clinical Measurements: °Goal: Ability to maintain clinical measurements within normal limits will improve °Outcome: Progressing °Goal: Will remain free from infection °Outcome: Progressing °Goal: Diagnostic test results will improve °Outcome: Progressing °Goal: Respiratory complications will improve °Outcome: Progressing °Goal: Cardiovascular complication will be avoided °Outcome: Progressing °  °Problem: Activity: °Goal: Risk for activity intolerance will decrease °Outcome: Progressing °  °Problem: Nutrition: °Goal: Adequate nutrition will be maintained °Outcome: Progressing °  °Problem: Coping: °Goal: Level of anxiety will decrease °Outcome: Progressing °  °Problem: Elimination: °Goal: Will not experience complications related to bowel motility °Outcome: Progressing °Goal: Will not experience complications related to urinary retention °Outcome: Progressing °  °Problem: Pain Managment: °Goal: General experience of comfort will improve °Outcome: Progressing °  °

## 2023-06-26 DIAGNOSIS — F1020 Alcohol dependence, uncomplicated: Secondary | ICD-10-CM | POA: Diagnosis not present

## 2023-06-26 DIAGNOSIS — I10 Essential (primary) hypertension: Secondary | ICD-10-CM | POA: Diagnosis not present

## 2023-06-26 DIAGNOSIS — I480 Paroxysmal atrial fibrillation: Secondary | ICD-10-CM | POA: Diagnosis not present

## 2023-06-26 DIAGNOSIS — I5032 Chronic diastolic (congestive) heart failure: Secondary | ICD-10-CM | POA: Diagnosis not present

## 2023-06-26 LAB — CBC
HCT: 39.1 % (ref 39.0–52.0)
Hemoglobin: 13.7 g/dL (ref 13.0–17.0)
MCH: 31.9 pg (ref 26.0–34.0)
MCHC: 35 g/dL (ref 30.0–36.0)
MCV: 91.1 fL (ref 80.0–100.0)
Platelets: 144 10*3/uL — ABNORMAL LOW (ref 150–400)
RBC: 4.29 MIL/uL (ref 4.22–5.81)
RDW: 12.3 % (ref 11.5–15.5)
WBC: 5 10*3/uL (ref 4.0–10.5)
nRBC: 0 % (ref 0.0–0.2)

## 2023-06-26 MED ORDER — MELATONIN 5 MG PO TABS: 5.0000 mg | ORAL_TABLET | Freq: Every day | ORAL | Status: AC

## 2023-06-26 MED ORDER — PANTOPRAZOLE SODIUM 40 MG PO TBEC: 40.0000 mg | DELAYED_RELEASE_TABLET | Freq: Two times a day (BID) | ORAL | 2 refills | Status: DC

## 2023-06-26 MED ORDER — POLYETHYLENE GLYCOL 3350 17 G PO PACK: 17.0000 g | PACK | Freq: Every day | ORAL | 0 refills | Status: DC | PRN

## 2023-06-26 MED ORDER — ONDANSETRON 8 MG PO TBDP: 8.0000 mg | ORAL_TABLET | Freq: Three times a day (TID) | ORAL | 0 refills | Status: DC | PRN

## 2023-06-26 NOTE — Progress Notes (Signed)
Mobility Specialist - Progress Note   06/26/23 1100  Mobility  Activity Ambulated with assistance in hallway  Level of Assistance Standby assist, set-up cues, supervision of patient - no hands on  Assistive Device Cane  Distance Ambulated (ft) 1000 ft  Activity Response Tolerated well  $Mobility charge 1 Mobility     Pt sitting in recliner upon arrival, utilizing RA. Ambulated in ~1000' hallway; no complaints. Pt returned to room with needs in reach.    [name] Mobility Specialist 06/26/23, 11:08 AM

## 2023-06-26 NOTE — Progress Notes (Signed)
Physical Therapy Treatment Patient Details Name: Douglas Peterson. MRN: [id number] DOB: [birth year] Today's Date: 06/26/2023   History of Present Illness 72 y.o. 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     Patient states that he has nausea, vomiting, abdominal pain in the past 3 days.  He has several episodes of nonbilious nonbloody vomiting.  Denies diarrhea.  His abdominal pain is located in central and upper abdomen, intermittent, mild to moderate, aching, nonradiating.  Pt was here for similar 8/11.        If plan is discharge home, recommend the following: Assistance with cooking/housework;Direct supervision/assist for medications management;Direct supervision/assist for financial management;Assist for transportation;Help with stairs or ramp for entrance;Supervision due to cognitive status;A little help with walking and/or transfers;A little help with bathing/dressing/bathroom     Equipment Recommendations  None recommended by PT       Precautions / Restrictions Precautions Precautions: Fall Restrictions Weight Bearing Restrictions: No     Mobility  Bed Mobility  General bed mobility comments: In recliner pre/post sesssion    Transfers Overall transfer level: Modified independent Equipment used: Straight cane Transfers: Sit to/from Stand Sit to Stand: Modified independent (Device/Increase time)   Ambulation/Gait Ambulation/Gait assistance: Supervision Gait Distance (Feet): 1000 Feet Assistive device: Straight cane Gait Pattern/deviations: Step-through pattern, Staggering left, Staggering right Gait velocity: decreased  General Gait Details: no LOB this date. tolerated increased gait distance well.     Balance Overall balance assessment: Needs assistance Sitting-balance support: Single extremity supported, Feet supported Sitting balance-Leahy Scale: Good     Standing balance support: Single extremity supported, During functional activity, Reliant on assistive device for balance Standing balance-Leahy Scale: Fair       Cognition Arousal: Alert Behavior During Therapy: WFL for tasks assessed/performed Overall Cognitive Status: Within Functional Limits for tasks assessed      General Comments: Pt is A and O x 4. Seems to be at baseline cognition.               Pertinent Vitals/Pain Pain Assessment Pain Assessment: No/denies pain Pain Score: 0-No pain     PT Goals (current goals can now be found in the care plan section) Acute Rehab PT Goals Patient Stated Goal: go home Progress towards PT goals: Progressing toward goals    Frequency    Min 1X/week      PT Plan Current plan remains appropriate       AM-PAC PT "6 Clicks" Mobility   Outcome Measure  Help needed turning from your back to your side while in a flat bed without using bedrails?: None Help needed moving from lying on your back to sitting on the side of a flat bed without using bedrails?: A Little Help needed moving to and from a bed to a chair (including a wheelchair)?: A Little Help needed standing up from a chair using your arms (e.g., wheelchair or bedside chair)?: A Little Help needed to walk in hospital room?: A Little Help needed climbing 3-5 steps with a railing? : A Little 6 Click Score: 19    End of Session   Activity Tolerance: Patient tolerated treatment well Patient left: in chair;with call bell/phone within reach;with chair alarm set Nurse Communication: Mobility status PT Visit Diagnosis: Unsteadiness on feet (R26.81);Difficulty in walking, not elsewhere classified (  R26.2);Muscle weakness (generalized) (M62.81)     Time: 1610-9604 PT Time Calculation (min) (ACUTE ONLY): 29  min  Charges:    $Gait Training: 8-22 mins $Therapeutic Activity: 8-22 mins PT General Charges $$ ACUTE PT VISIT: 1 Visit                    Jetta Lout PTA 06/26/23, 9:42 AM

## 2023-06-26 NOTE — Discharge Summary (Signed)
Physician Discharge Summary   Patient: Douglas Peterson. MRN: [id number] DOB: 18-Aug-1951  Admit date:     06/16/2023  Discharge date: 06/26/2023 (delayed until 06/30/2023 due to appeal)  Discharge Physician: Pennie Banter   PCP: Melonie Florida, FNP   Recommendations at discharge:   Follow up with Primary Care in 1-2 weeks Repeat CBC, BMP, Mg, Phos at follow up Continue to support patient's efforts at alcohol cessation and consider medication if indicated  Discharge Diagnoses: Principal Problem:   Abdominal pain Active Problems:   Alcohol use disorder, severe, dependence (HCC)   SIRS (systemic inflammatory response syndrome) (HCC)   Elevated lactic acid level   GERD (gastroesophageal reflux disease)   Chronic diastolic CHF (congestive heart failure) (HCC)   Paroxysmal A-fib (HCC)   Dyslipidemia   Essential hypertension   BPH (benign prostatic hyperplasia)   Left inguinal hernia  Resolved Problems:   * No resolved hospital problems. [facility] Course:  HPI on admission: "Past medical history of chronic alcohol use, atrial fibrillation not on anticoagulation, hyponatremia, TBI, who presents to the emergency department with multiple symptoms.   He notes that he is continuing to have abdominal discomfort and nausea and vomiting.  He feels fatigued.  He has shortness of breath.  He has had a cough and feels like his chest is congested.  He denies fever or chest pain.  He denies abdominal pain or GU symptoms.   He continues to drink a significant amount of alcohol and his last drink was just prior to arrival.  He denies drug use.   When I ask him to clarify why he was here in the emergency department despite being evaluated 2 days ago, he notes that his cough is worse and his breathing is worse and all other symptoms are chronic and ongoing and unchanged."   Further hospital course and management as outlined below.   In summary, pt's abdominal pain improved with supportive  care as outlined. Delirium resolved and patient's mentation returned to baseline. SNF was recommended for rehab but declined by insurance.  This denial is being appealed. Pt continues to work with PT and mobility techs while in hospital.   Mobility appears to be progressing well. Pt is non-compliant with dysphagia diet as recommended, requests normal diet and seems to tolerate this.  No reports of aspiration episodes from pt or nursing staff.   06/26/23 -- pt now ambulating 1000 feet.  Insurance appeal for SNF was apparently not started.  Pt now agreeable to go home and declines home health.  He is medically stable for discharge home today.    Assessment and Plan:  # Abdominal pain # Nausea - intermittent, chronic # Alcoholic Gastritis Resolved. CT abd/pelvis 8/11 negative. Labs essentially unremarkable, mild hyponatremia resolved. Suspect gastritis from alcohol abuse - continue PPI - anti-emetics PRN - d/c on Zofran ODT's   # Lactic acidosis 2/2 alcohol intoxication, resolved with fluids. No s/s infection   # Staph epi & hominis positive blood culture In one of two bottles, clinically relatively low suspicion for bacteremia - continue to follow clinically   # Dysphagia Severe, SLP advising NPO and feeding tube but patient previously declined, aware of risks - dysphagia diet (ND-3) - pt requested to resume normal diet - ordered after repeat discussion of risks   # Alcohol abuse # Hospital delirium No withdrawal symptoms. Delirium appears resolved. - continue ciwa lorazepam - will stop tomorrow if scores remain low - vitamins - now s/p high dose  thiamine   # Debility PT advising SNF, patient initially hesitant but now says he doesn't feel safe going home and requests SNF - TOC consulted, [facility] accepted, pending insurance auth - insurance declined, peer to peer performed 8/19, still declined, patient now planning appeal - daughter says can go to [facility] in  [place], was there before, will let TOC know   # A-fib, paroxysmal Rate controlled, not on anticoagulant - continue coreg   # HFpEF Euvolemic   # HTN Controlled - cont home coreg   # BPH - cont home flomax       Consultants: None Procedures performed: None  Disposition: Home Diet recommendation:  Discharge Diet Orders (From admission, onward)     Start     Ordered   06/26/23 0000  Diet - low sodium heart healthy       Pt refuses recommended dysphagia diet 06/26/23 1228            DISCHARGE MEDICATION: Allergies as of 06/30/2023       Reactions   Hydrochlorothiazide Other (See Comments)   Hyponatremia        Medication List     STOP taking these medications    meloxicam 15 MG tablet Commonly known as: MOBIC   metoCLOPramide 10 MG tablet Commonly known as: REGLAN       TAKE these medications    acetaminophen 500 MG tablet Commonly known as: TYLENOL Take 2 tablets (1,000 mg total) by mouth every 8 (eight) hours as needed for mild pain or moderate pain.   atenolol 25 MG tablet Commonly known as: TENORMIN Take 1 tablet (25 mg total) by mouth daily.   atorvastatin 10 MG tablet Commonly known as: LIPITOR Take 1 tablet (10 mg total) by mouth daily.   baclofen 10 MG tablet Commonly known as: LIORESAL Take 1 tablet (10 mg total) by mouth 3 (three) times daily.   feeding supplement Liqd Take 237 mLs by mouth 2 (two) times daily between meals.   folic acid 1 MG tablet Commonly known as: FOLVITE Take 1 tablet (1 mg total) by mouth daily.   melatonin 5 MG Tabs Take 1 tablet (5 mg total) by mouth at bedtime.   multivitamin with minerals Tabs tablet Take 1 tablet by mouth daily.   ondansetron 8 MG disintegrating tablet Commonly known as: ZOFRAN-ODT Take 1 tablet (8 mg total) by mouth every 8 (eight) hours as needed.   pantoprazole 40 MG tablet Commonly known as: PROTONIX Take 1 tablet (40 mg total) by mouth 2 (two) times daily.    polyethylene glycol 17 g packet Commonly known as: MIRALAX / GLYCOLAX Take 17 g by mouth daily as needed. Mix one tablespoon with 8oz of your favorite juice or water every day until you are having soft formed stools. Then start taking once daily if you didn't have a stool the day before. What changed:  when to take this reasons to take this   tamsulosin 0.4 MG Caps capsule Commonly known as: FLOMAX Take 1 capsule (0.4 mg total) by mouth daily.   thiamine 100 MG tablet Commonly known as: VITAMIN B1 Take 1 tablet (100 mg total) by mouth daily.        Contact information for after-discharge care     Destination     [facility] Preferred SNF .   Service: Skilled Paramedic information: [address] [place] [address] [phone]  Discharge Exam: Filed Weights   06/25/23 0335 06/27/23 0418 06/30/23 0406  Weight: 58 kg 58.3 kg 58.3 kg   General exam: awake, alert, no acute distress, chronically ill appearing HEENT: moist mucus membranes, hearing grossly normal  Respiratory system: CTAB, no wheezes, rales or rhonchi, normal respiratory effort. Cardiovascular system: normal S1/S2, RRR, no pedal edema.   Gastrointestinal system: soft, NT, ND, no HSM felt, +bowel sounds. Central nervous system: A&O x3. no gross focal neurologic deficits, normal speech Extremities: moves all, no edema, normal tone Skin: dry, intact, normal temperature, normal color, No rashes, lesions or ulcers Psychiatry: normal mood, congruent affect, judgement and insight appear normal   Condition at discharge: stable  The results of significant diagnostics from this hospitalization (including imaging, microbiology, ancillary and laboratory) are listed below for reference.   Imaging Studies: CT Angio Chest PE W/Cm &/Or Wo Cm  Result Date: 06/16/2023 CLINICAL DATA:  Concern for point embolism. EXAM: CT ANGIOGRAPHY CHEST WITH  CONTRAST TECHNIQUE: Multidetector CT imaging of the chest was performed using the standard protocol during bolus administration of intravenous contrast. Multiplanar CT image reconstructions and MIPs were obtained to evaluate the vascular anatomy. RADIATION DOSE REDUCTION: This exam was performed according to the departmental dose-optimization program which includes automated exposure control, adjustment of the mA and/or kV according to patient size and/or use of iterative reconstruction technique. CONTRAST:  75mL OMNIPAQUE IOHEXOL 350 MG/ML SOLN COMPARISON:  Radiograph dated 06/16/2023. FINDINGS: Cardiovascular: There is no cardiomegaly or pericardial effusion. Mild atherosclerotic calcification of the aortic arch. No aneurysmal dilatation or dissection. The origins of the great vessels of the aortic arch are patent. No pulmonary artery embolus identified. Mediastinum/Nodes: No hilar or mediastinal adenopathy. The esophagus is grossly unremarkable. No mediastinal fluid collection. Lungs/Pleura: There is eventration of the right hemidiaphragm with right lung base atelectasis. The left lung is clear. There is no pneumothorax. The central airways are patent. Upper Abdomen: No acute abnormality. Musculoskeletal: No acute osseous pathology. Review of the MIP images confirms the above findings. IMPRESSION: 1. No acute intrathoracic pathology. No CT evidence of pulmonary embolism. 2. 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 3.  Aortic Atherosclerosis (ICD10-I70.0). Electronically Signed   By: Elgie Collard M.D.   On: 06/16/2023 19:09   DG Chest Port 1 View  Result Date: 06/16/2023 CLINICAL DATA:  Cough, leukocytosis EXAM: PORTABLE CHEST 1 VIEW COMPARISON:  06/14/2023 FINDINGS: Low lung volumes. Lungs are essentially clear. No pleural effusion or pneumothorax. The heart is normal in size. IMPRESSION: No acute cardiopulmonary disease. Electronically Signed   By: Charline Bills M.D.    On: 06/16/2023 17:18   CT ABDOMEN PELVIS W CONTRAST  Result Date: 06/14/2023 CLINICAL DATA:  Acute abdominal pain.  Weakness.  Diarrhea. EXAM: CT ABDOMEN AND PELVIS WITH CONTRAST TECHNIQUE: Multidetector CT imaging of the abdomen and pelvis was performed using the standard protocol following bolus administration of intravenous contrast. RADIATION DOSE REDUCTION: This exam was performed according to the departmental dose-optimization program which includes automated exposure control, adjustment of the mA and/or kV according to patient size and/or use of iterative reconstruction technique. CONTRAST:  80mL OMNIPAQUE IOHEXOL 300 MG/ML  SOLN COMPARISON:  05/20/2022 FINDINGS: Lower Chest: No acute findings. Hepatobiliary: No suspicious hepatic masses identified. Gallbladder is unremarkable. No evidence of biliary ductal dilatation. Pancreas:  No mass or inflammatory changes. Spleen: Within normal limits in size and appearance. Adrenals/Urinary Tract: No suspicious masses identified. No evidence of ureteral calculi or hydronephrosis. Unremarkable unopacified urinary bladder. Stomach/Bowel:  No evidence of obstruction, inflammatory process or abnormal fluid collections. Vascular/Lymphatic: No pathologically enlarged lymph nodes. No acute vascular findings. Reproductive:  No mass or other significant abnormality. Other: Stable large left inguinal hernia which contains a loop of sigmoid colon. No No evidence of bowel obstruction or strangulation. Stable small right inguinal hernia, which contains only fat. Musculoskeletal: No suspicious bone lesions identified. Stable old L1 and L2 vertebral body compression fractures with vertebroplasty at L2. IMPRESSION: No acute findings. Stable large left inguinal hernia, which contains a loop of sigmoid colon. No evidence of bowel obstruction or strangulation. Stable small right inguinal hernia, which contains only fat. Electronically Signed   By: Danae Orleans M.D.   On: 06/14/2023  17:40   DG Chest 1 View  Result Date: 06/14/2023 CLINICAL DATA:  Shortness of breath, cough, and weakness for 3 days. EXAM: CHEST  1 VIEW COMPARISON:  06/04/2023 FINDINGS: The heart size and mediastinal contours are within normal limits. Low lung volumes are again seen. Both lungs are clear. Old fracture deformities of both clavicles, right humeral neck, and right ribs again seen. IMPRESSION: No active cardiopulmonary disease. Electronically Signed   By: Danae Orleans M.D.   On: 06/14/2023 17:24   DG Chest 2 View  Result Date: 06/04/2023 CLINICAL DATA:  Shortness of breath EXAM: CHEST - 2 VIEW COMPARISON:  Chest radiograph 1 day prior FINDINGS: The cardiomediastinal silhouette is stable Lung volumes are low with unchanged asymmetric elevation of the right hemidiaphragm. There is no focal consolidation or pulmonary edema. There is no pleural effusion or pneumothorax There is no acute osseous abnormality. There is deformity of the proximal right humerus, right posterior fourth rib, and bilateral clavicles consistent with prior fracture. IMPRESSION: Stable chest with no radiographic evidence of acute cardiopulmonary process. Electronically Signed   By: Lesia Hausen M.D.   On: 06/04/2023 16:23   DG Abd 1 View  Result Date: 06/03/2023 CLINICAL DATA:  [id number] Hyponatremia [id number] EXAM: ABDOMEN - 1 VIEW COMPARISON:  X-ray abdomen 06/13/2022 FINDINGS: The bowel gas pattern is normal. No radio-opaque calculi or other significant radiographic abnormality are seen. Surgical changes overlie the right lower abdomen. L2 kyphoplasty. IMPRESSION: Nonobstructive bowel gas pattern. Electronically Signed   By: Tish Frederickson M.D.   On: 06/03/2023 00:50   DG Chest Port 1 View  Result Date: 06/03/2023 CLINICAL DATA:  01027 Hyponatremia [id number] EXAM: PORTABLE CHEST 1 VIEW COMPARISON:  Chest x-ray 05/04/2023, CT chest 03/13/2022 FINDINGS: The heart and mediastinal contours are unchanged. Low lung volumes. No focal consolidation.  No pulmonary edema. No pleural effusion. No pneumothorax. No acute osseous abnormality. Old healed right rib fracture. Old healed left clavicular fracture. Old healed right clavicular fracture. IMPRESSION: Low lung volumes with no active disease. Electronically Signed   By: Tish Frederickson M.D.   On: 06/03/2023 00:49    Microbiology: Results for orders placed or performed during the hospital encounter of 06/16/23  Resp panel by RT-PCR (RSV, Flu A&B, Covid) Anterior Nasal Swab     Status: None   Collection Time: 06/16/23  4:40 PM   Specimen: Anterior Nasal Swab  Result Value Ref Range Status   SARS Coronavirus 2 by RT PCR NEGATIVE NEGATIVE Final    Comment: (NOTE) SARS-CoV-2 target nucleic acids are NOT DETECTED.  The SARS-CoV-2 RNA is generally detectable in upper respiratory specimens during the acute phase of infection. The lowest concentration of SARS-CoV-2 viral copies this assay can detect is 138 copies/mL. A negative result does not preclude SARS-Cov-2 infection  and should not be used as the sole basis for treatment or other patient management decisions. A negative result may occur with  improper specimen collection/handling, submission of specimen other than nasopharyngeal swab, presence of viral mutation(s) within the areas targeted by this assay, and inadequate number of viral copies(<138 copies/mL). A negative result must be combined with clinical observations, patient history, and epidemiological information. The expected result is Negative.  Fact Sheet for Patients:  BloggerCourse.com  Fact Sheet for Healthcare Providers:  SeriousBroker.it  This test is no t yet approved or cleared by the [place] FDA and  has been authorized for detection and/or diagnosis of SARS-CoV-2 by FDA under an Emergency Use Authorization (EUA). This EUA will remain  in effect (meaning this test can be used) for the duration of the COVID-19  declaration under Section 564(b)(1) of the Act, 21 U.S.C.section 360bbb-3(b)(1), unless the authorization is terminated  or revoked sooner.       Influenza A by PCR NEGATIVE NEGATIVE Final   Influenza B by PCR NEGATIVE NEGATIVE Final    Comment: (NOTE) The Xpert Xpress SARS-CoV-2/FLU/RSV plus assay is intended as an aid in the diagnosis of influenza from Nasopharyngeal swab specimens and should not be used as a sole basis for treatment. Nasal washings and aspirates are unacceptable for Xpert Xpress SARS-CoV-2/FLU/RSV testing.  Fact Sheet for Patients: BloggerCourse.com  Fact Sheet for Healthcare Providers: SeriousBroker.it  This test is not yet approved or cleared by the [place] FDA and has been authorized for detection and/or diagnosis of SARS-CoV-2 by FDA under an Emergency Use Authorization (EUA). This EUA will remain in effect (meaning this test can be used) for the duration of the COVID-19 declaration under Section 564(b)(1) of the Act, 21 U.S.C. section 360bbb-3(b)(1), unless the authorization is terminated or revoked.     Resp Syncytial Virus by PCR NEGATIVE NEGATIVE Final    Comment: (NOTE) Fact Sheet for Patients: BloggerCourse.com  Fact Sheet for Healthcare Providers: SeriousBroker.it  This test is not yet approved or cleared by the [place] FDA and has been authorized for detection and/or diagnosis of SARS-CoV-2 by FDA under an Emergency Use Authorization (EUA). This EUA will remain in effect (meaning this test can be used) for the duration of the COVID-19 declaration under Section 564(b)(1) of the Act, 21 U.S.C. section 360bbb-3(b)(1), unless the authorization is terminated or revoked.  Performed at [facility], [address] [place], [place] [address]   Culture, blood (x 2)     Status: None   Collection Time: 06/16/23 10:27 PM    Specimen: BLOOD  Result Value Ref Range Status   Specimen Description BLOOD BLOOD LEFT HAND  Final   Special Requests   Final    BOTTLES DRAWN AEROBIC AND ANAEROBIC Blood Culture adequate volume   Culture   Final    NO GROWTH 5 DAYS Performed at [facility], [address], [place], [place] [address]    Report Status 06/21/2023 FINAL  Final  Culture, blood (x 2)     Status: Abnormal   Collection Time: 06/16/23 10:34 PM   Specimen: BLOOD  Result Value Ref Range Status   Specimen Description   Final    BLOOD BLOOD RIGHT HAND Performed at [facility], [address], [place], [place] [address]    Special Requests   Final    BOTTLES DRAWN AEROBIC AND ANAEROBIC Blood Culture adequate volume Performed at [facility], [address] [place], [place] [address]  Culture  Setup Time   Final    IN BOTH AEROBIC AND ANAEROBIC BOTTLES GRAM POSITIVE COCCI CRITICAL RESULT CALLED TO, READ BACK BY AND VERIFIED WITH: ALEX CHAPPELL 06/17/23 1516 KLW    Culture (A)  Final    STAPHYLOCOCCUS HOMINIS STAPHYLOCOCCUS EPIDERMIDIS THE SIGNIFICANCE OF ISOLATING THIS ORGANISM FROM A SINGLE SET OF BLOOD CULTURES WHEN MULTIPLE SETS ARE DRAWN IS UNCERTAIN. PLEASE NOTIFY THE MICROBIOLOGY DEPARTMENT WITHIN ONE WEEK IF SPECIATION AND SENSITIVITIES ARE REQUIRED. Performed at [facility], [address] [place], [place] [address]    Report Status 06/19/2023 FINAL  Final  Blood Culture ID Panel (Reflexed)     Status: Abnormal   Collection Time: 06/16/23 10:34 PM  Result Value Ref Range Status   Enterococcus faecalis NOT DETECTED NOT DETECTED Final   Enterococcus Faecium NOT DETECTED NOT DETECTED Final   Listeria monocytogenes NOT DETECTED NOT DETECTED Final   Staphylococcus species DETECTED (A) NOT DETECTED Final    Comment: CRITICAL RESULT CALLED TO, READ BACK BY AND VERIFIED WITH: ALEX CHAPPELL 06/17/23 1516 KLW    Staphylococcus aureus (BCID) NOT DETECTED NOT  DETECTED Final   Staphylococcus epidermidis DETECTED (A) NOT DETECTED Final    Comment: Methicillin (oxacillin) resistant coagulase negative staphylococcus. Possible blood culture contaminant (unless isolated from more than one blood culture draw or clinical case suggests pathogenicity). No antibiotic treatment is indicated for blood  culture contaminants. CRITICAL RESULT CALLED TO, READ BACK BY AND VERIFIED WITH: ALEX CHAPPELL 06/17/23 1516 KLW    Staphylococcus lugdunensis NOT DETECTED NOT DETECTED Final   Streptococcus species NOT DETECTED NOT DETECTED Final   Streptococcus agalactiae NOT DETECTED NOT DETECTED Final   Streptococcus pneumoniae NOT DETECTED NOT DETECTED Final   Streptococcus pyogenes NOT DETECTED NOT DETECTED Final   A.calcoaceticus-baumannii NOT DETECTED NOT DETECTED Final   Bacteroides fragilis NOT DETECTED NOT DETECTED Final   Enterobacterales NOT DETECTED NOT DETECTED Final   Enterobacter cloacae complex NOT DETECTED NOT DETECTED Final   Escherichia coli NOT DETECTED NOT DETECTED Final   Klebsiella aerogenes NOT DETECTED NOT DETECTED Final   Klebsiella oxytoca NOT DETECTED NOT DETECTED Final   Klebsiella pneumoniae NOT DETECTED NOT DETECTED Final   Proteus species NOT DETECTED NOT DETECTED Final   Salmonella species NOT DETECTED NOT DETECTED Final   Serratia marcescens NOT DETECTED NOT DETECTED Final   Haemophilus influenzae NOT DETECTED NOT DETECTED Final   Neisseria meningitidis NOT DETECTED NOT DETECTED Final   Pseudomonas aeruginosa NOT DETECTED NOT DETECTED Final   Stenotrophomonas maltophilia NOT DETECTED NOT DETECTED Final   Candida albicans NOT DETECTED NOT DETECTED Final   Candida auris NOT DETECTED NOT DETECTED Final   Candida glabrata NOT DETECTED NOT DETECTED Final   Candida krusei NOT DETECTED NOT DETECTED Final   Candida parapsilosis NOT DETECTED NOT DETECTED Final   Candida tropicalis NOT DETECTED NOT DETECTED Final   Cryptococcus neoformans/gattii  NOT DETECTED NOT DETECTED Final   Methicillin resistance mecA/C DETECTED (A) NOT DETECTED Final    Comment: CRITICAL RESULT CALLED TO, READ BACK BY AND VERIFIED WITH: ALEX CHAPPELL 06/17/23 1516 KLW Performed at [facility], [address] [place], [place] [address]     Labs: CBC: Recent Labs  Lab 06/26/23 0605  WBC 5.0  HGB 13.7  HCT 39.1  MCV 91.1  PLT 144*   Basic Metabolic Panel: No results for input(s): "NA", "K", "CL", "CO2", "GLUCOSE", "BUN", "CREATININE", "CALCIUM", "MG", "PHOS" in the last 168 hours.  Liver Function Tests: No results  for input(s): "AST", "ALT", "ALKPHOS", "BILITOT", "PROT", "ALBUMIN" in the last 168 hours.  CBG: No results for input(s): "GLUCAP" in the last 168 hours.  Discharge time spent: less than 30 minutes.  Signed: Pennie Banter, DO [facility] 06/30/2023

## 2023-06-26 NOTE — Plan of Care (Signed)
  Problem: Education: Goal: Knowledge of General Education information will improve Description: Including pain rating scale, medication(s)/side effects and non-pharmacologic comfort measures Outcome: Progressing   Problem: Clinical Measurements: Goal: Ability to maintain clinical measurements within normal limits will improve Outcome: Progressing   Problem: Clinical Measurements: Goal: Will remain free from infection Outcome: Progressing   Problem: Clinical Measurements: Goal: Diagnostic test results will improve Outcome: Progressing   Problem: Clinical Measurements: Goal: Respiratory complications will improve Outcome: Progressing   

## 2023-06-26 NOTE — TOC Progression Note (Signed)
Transition of Care (TOC) - Progression Note    Patient Details  Name: Douglas Peterson. MRN: [id number] Date of Birth: [birth year]  Transition of Care [facility]) CM/SW Contact  [name], RN Phone Number: 06/26/2023, 3:15 PM  Clinical Narrative:     Patient has file appeal on discharge though [employer]        Expected Discharge Plan and Services         Expected Discharge Date: 06/26/23                                     Social Determinants of Health (SDOH) Interventions SDOH Screenings   Food Insecurity: No Food Insecurity (06/16/2023)  Housing: Low Risk  (06/16/2023)  Transportation Needs: No Transportation Needs (06/16/2023)  Utilities: Not At Risk (06/16/2023)  Financial Resource Strain: Unknown (06/13/2019)  Physical Activity: Unknown (06/13/2019)  Social Connections: Unknown (06/13/2019)  Stress: Unknown (06/13/2019)  Tobacco Use: Medium Risk (06/16/2023)    Readmission Risk Interventions    06/17/2023    4:23 PM 04/29/2022   10:26 AM 03/14/2022   12:48 PM  Readmission Risk Prevention Plan  Transportation Screening Complete Complete Complete  PCP or Specialist Appt within 3-5 Days   Complete  HRI or Home Care Consult Complete Complete Complete  Social Work Consult for Recovery Care Planning/Counseling Complete Complete Complete  Palliative Care Screening Not Applicable Not Applicable Not Applicable  Medication Review Oceanographer) Complete Complete Complete

## 2023-06-26 NOTE — TOC Progression Note (Signed)
Transition of Care (TOC) - Progression Note    Patient Details  Name: Douglas Peterson. MRN: [id number] Date of Birth: 06/11/1951  Transition of Care [facility]) CM/SW Contact  [name] Phone Number: 06/26/2023, 3:28 PM   [facility] Appeal Detailed Notice of Discharge letter created and saved: Yes (Completed by Bevelyn Ngo, RN) Detailed Notice of Discharge Document Given to [employer]: Yes (Completed by Bevelyn Ngo, RN) [employer] Document Created: Yes [employer] appeal documents uploaded to [employer] stite: Yes (Document Upload Confirmation: [id number])   Case ID: [id number]

## 2023-06-26 NOTE — TOC Transition Note (Signed)
Transition of Care [facility]) - CM/SW Discharge Note   Patient Details  Name: Douglas Peterson. MRN: [id number] Date of Birth: Jun 25, 1951  Transition of Care [facility]) CM/SW Contact:  [name], RN Phone Number: 06/26/2023, 12:33 PM   Clinical Narrative:        Jeanice Lim with TOC checked with [employer]. And Candise Bowens states that they received call from patient on 8/20 but did not process the appeal, and if patient wants to proceed with the appeal he will need to call again.  Met with patient at bedside.  Patient now ambulating 1000 feet.  Notified patient of the above.  He states that he does not wish to appeal again with [employer] and will discharge home.  Discussed option of home health.  Patient declines.  Previous admission patient was in agreement for home health, and then declined after discharged   Patient confirms that he has clean clothes and shoes for discharge  Patient states that he does not have his wallet to pay for a cab.  Cab voucher on chart.  RN aware  Patient states that he does not have his key to get in his home.  He states that he is going to call his neighbor  to see if the door is unlocked.  [name] aware that once he arrives if he is unable to get in the home he will need to call a lock smith.  Patient does have his smart phone to look up locksmiths, but I have also provided a list of local locksmiths to be placed in his discharge packet  Patient declines for family to be updated   Substance abuse resources are on AVS  [facility] updated      Patient Goals and CMS Choice      Discharge Placement                         Discharge Plan and Services Additional resources added to the After Visit Summary for                                       Social Determinants of Health (SDOH) Interventions SDOH Screenings   Food Insecurity: No Food Insecurity (06/16/2023)  Housing: Low Risk  (06/16/2023)  Transportation Needs: No Transportation Needs  (06/16/2023)  Utilities: Not At Risk (06/16/2023)  Financial Resource Strain: Unknown (06/13/2019)  Physical Activity: Unknown (06/13/2019)  Social Connections: Unknown (06/13/2019)  Stress: Unknown (06/13/2019)  Tobacco Use: Medium Risk (06/16/2023)     Readmission Risk Interventions    06/17/2023    4:23 PM 04/29/2022   10:26 AM 03/14/2022   12:48 PM  Readmission Risk Prevention Plan  Transportation Screening Complete Complete Complete  PCP or Specialist Appt within 3-5 Days   Complete  HRI or Home Care Consult Complete Complete Complete  Social Work Consult for Recovery Care Planning/Counseling Complete Complete Complete  Palliative Care Screening Not Applicable Not Applicable Not Applicable  Medication Review Oceanographer) Complete Complete Complete

## 2023-06-27 MED ORDER — ONDANSETRON 4 MG PO TBDP
8.0000 mg | ORAL_TABLET | Freq: Three times a day (TID) | ORAL | Status: DC | PRN
  Administered 2023-06-27: 8 mg via ORAL
  Filled 2023-06-27: qty 2

## 2023-06-27 NOTE — Progress Notes (Signed)
Mobility Specialist - Progress Note   06/27/23 0834  Mobility  Activity Ambulated with assistance in hallway;Stood at bedside;Dangled on edge of bed  Level of Assistance Standby assist, set-up cues, supervision of patient - no hands on  Assistive Device Cane  Distance Ambulated (ft) 600 ft  Activity Response Tolerated well  Mobility Referral Yes  $Mobility charge 1 Mobility  Mobility Specialist Start Time (ACUTE ONLY) 0801  Mobility Specialist Stop Time (ACUTE ONLY) 0824  Mobility Specialist Time Calculation (min) (ACUTE ONLY) 23 min   Pt supine in bed on RA upon arrival. Pt completes bed mobility, and dons shoes indep. Pt STS and ambulates in hallway Supervision with one stumble corrected indep. Pt left in recliner with needs in reach.   Terrilyn Saver  Mobility Specialist  06/27/23 8:35 AM

## 2023-06-27 NOTE — Plan of Care (Signed)

## 2023-06-27 NOTE — TOC Progression Note (Signed)
Transition of Care (TOC) - Progression Note    Patient Details  Name: Corbin Krampitz. MRN: [id number] Date of Birth: 1951/09/15  Transition of Care [facility]) CM/SW Contact  Kemper Durie, RN Phone Number: 06/27/2023, 9:21 AM  Clinical Narrative:     Appeal under clinical review       Expected Discharge Plan and Services         Expected Discharge Date: 06/26/23                                     Social Determinants of Health (SDOH) Interventions SDOH Screenings   Food Insecurity: No Food Insecurity (06/16/2023)  Housing: Low Risk  (06/16/2023)  Transportation Needs: No Transportation Needs (06/16/2023)  Utilities: Not At Risk (06/16/2023)  Financial Resource Strain: Unknown (06/13/2019)  Physical Activity: Unknown (06/13/2019)  Social Connections: Unknown (06/13/2019)  Stress: Unknown (06/13/2019)  Tobacco Use: Medium Risk (06/16/2023)    Readmission Risk Interventions    06/17/2023    4:23 PM 04/29/2022   10:26 AM 03/14/2022   12:48 PM  Readmission Risk Prevention Plan  Transportation Screening Complete Complete Complete  PCP or Specialist Appt within 3-5 Days   Complete  HRI or Home Care Consult Complete Complete Complete  Social Work Consult for Recovery Care Planning/Counseling Complete Complete Complete  Palliative Care Screening Not Applicable Not Applicable Not Applicable  Medication Review Oceanographer) Complete Complete Complete

## 2023-06-27 NOTE — Progress Notes (Addendum)
Brief rounding note  Patient was discharged yesterday, but is appealing the discharge. See TOC notes. He remains stable for discharge.  Reports ongoing intermittent nausea which is chronic.  Tolerating meals.  Plan remains as outlined in prior notes and discharge summary of 06/26/23.   General exam: awake, alert, no acute distress HEENT: moist mucus membranes, hearing grossly normal  Respiratory system: on room air, normal respiratory effort. Cardiovascular system: RRR, no edema Central nervous system: A&O x3. no gross focal neurologic deficits, normal speech Extremities: stable contracted RUE, no edema, normal tone

## 2023-06-28 NOTE — TOC Progression Note (Addendum)
Transition of Care (TOC) - Progression Note    Patient Details  Name: Douglas Peterson. MRN: [id number] Date of Birth: October 08, 1951  Transition of Care [facility]) CM/SW Contact  Kemper Durie, RN Phone Number: 06/28/2023, 9:59 AM  Clinical Narrative:     Patient was notified by [name] yesterday that appeal was denied, however he has decided to file for reconsideration.  Notified that decision will be made within the next 48 hours.      Update 1107:  Copies of Important Message and Detailed Notice of Discharge faxed to [facility].  Expected Discharge Plan and Services         Expected Discharge Date: 06/26/23                                     Social Determinants of Health (SDOH) Interventions SDOH Screenings   Food Insecurity: No Food Insecurity (06/16/2023)  Housing: Low Risk  (06/16/2023)  Transportation Needs: No Transportation Needs (06/16/2023)  Utilities: Not At Risk (06/16/2023)  Financial Resource Strain: Unknown (06/13/2019)  Physical Activity: Unknown (06/13/2019)  Social Connections: Unknown (06/13/2019)  Stress: Unknown (06/13/2019)  Tobacco Use: Medium Risk (06/16/2023)    Readmission Risk Interventions    06/17/2023    4:23 PM 04/29/2022   10:26 AM 03/14/2022   12:48 PM  Readmission Risk Prevention Plan  Transportation Screening Complete Complete Complete  PCP or Specialist Appt within 3-5 Days   Complete  HRI or Home Care Consult Complete Complete Complete  Social Work Consult for Recovery Care Planning/Counseling Complete Complete Complete  Palliative Care Screening Not Applicable Not Applicable Not Applicable  Medication Review Oceanographer) Complete Complete Complete

## 2023-06-28 NOTE — Plan of Care (Signed)
  Problem: Clinical Measurements: Goal: Ability to maintain clinical measurements within normal limits will improve Outcome: Progressing   Problem: Clinical Measurements: Goal: Will remain free from infection Outcome: Progressing   Problem: Clinical Measurements: Goal: Diagnostic test results will improve Outcome: Progressing   Problem: Clinical Measurements: Goal: Respiratory complications will improve Outcome: Progressing   Problem: Clinical Measurements: Goal: Cardiovascular complication will be avoided Outcome: Progressing   

## 2023-06-28 NOTE — Progress Notes (Signed)
Mobility Specialist - Progress Note   06/28/23 1610  Mobility  Activity Ambulated with assistance in hallway;Stood at bedside;Dangled on edge of bed  Level of Assistance Standby assist, set-up cues, supervision of patient - no hands on  Assistive Device Cane  Distance Ambulated (ft) 1000 ft  Activity Response Tolerated well  Mobility Referral Yes  $Mobility charge 1 Mobility  Mobility Specialist Start Time (ACUTE ONLY) 0754  Mobility Specialist Stop Time (ACUTE ONLY) 0817  Mobility Specialist Time Calculation (min) (ACUTE ONLY) 23 min   Pt sitting in recliner on RA upon arrival. Pt STS and ambulates to/from Medical Mall SBA with no physical assistance needed. Pt returns to recliner with needs in reach.   Terrilyn Saver  Mobility Specialist  06/28/23 8:23 AM

## 2023-06-28 NOTE — Progress Notes (Signed)
Brief rounding note  Patient was discharged 06/26/23, but is appealing the discharge. See TOC notes. He remains medically stable for discharge.  Reports ongoing intermittent abdominal pain and nausea which is chronic.   Tolerating meals.  Continues to ambulate well with mobility techs. No acute issues.  Plan remains as outlined in prior notes and discharge summary of 06/26/23.   General exam: awake, alert, no acute distress HEENT: moist mucus membranes, hearing grossly normal  Respiratory system: on room air, normal respiratory effort. Cardiovascular system: RRR, no edema Central nervous system: no gross focal neurologic deficits, normal speech Extremities: stable contracted RUE, no edema, normal tone

## 2023-06-28 NOTE — Progress Notes (Signed)
Mobility Specialist - Progress Note   06/28/23 1400  Mobility  Activity Ambulated with assistance in hallway;Stood at bedside;Dangled on edge of bed  Level of Assistance Standby assist, set-up cues, supervision of patient - no hands on  Assistive Device Cane  Distance Ambulated (ft) 1000 ft  Activity Response Tolerated well  Mobility Referral Yes  $Mobility charge 1 Mobility  Mobility Specialist Start Time (ACUTE ONLY) 1241  Mobility Specialist Stop Time (ACUTE ONLY) 1301  Mobility Specialist Time Calculation (min) (ACUTE ONLY) 20 min   Pt sitting in recliner on RA upon arrival. Pt STS and ambulates in hallway SBA with no LOB noted. Pt returns to recliner with needs in reach.   Terrilyn Saver  Mobility Specialist  06/28/23 2:06 PM

## 2023-06-29 DIAGNOSIS — F1020 Alcohol dependence, uncomplicated: Secondary | ICD-10-CM | POA: Diagnosis not present

## 2023-06-29 MED ORDER — BACLOFEN 10 MG PO TABS: 10.0000 mg | ORAL_TABLET | Freq: Three times a day (TID) | ORAL | 0 refills | Status: DC

## 2023-06-29 NOTE — TOC Progression Note (Signed)
Transition of Care (TOC) - Progression Note    Patient Details  Name: Douglas Peterson. MRN: [id number] Date of Birth: February 28, 1951  Transition of Care [facility]) CM/SW Contact  [name], RN Phone Number: 06/29/2023, 12:11 PM  Clinical Narrative:     Reconsideration pending  Patient confirms that his neighbor checked the front door of his home and it is unlocked for him to get into at discharge       Expected Discharge Plan and Services         Expected Discharge Date: 06/26/23                                     Social Determinants of Health (SDOH) Interventions SDOH Screenings   Food Insecurity: No Food Insecurity (06/16/2023)  Housing: Low Risk  (06/16/2023)  Transportation Needs: No Transportation Needs (06/16/2023)  Utilities: Not At Risk (06/16/2023)  Financial Resource Strain: Unknown (06/13/2019)  Physical Activity: Unknown (06/13/2019)  Social Connections: Unknown (06/13/2019)  Stress: Unknown (06/13/2019)  Tobacco Use: Medium Risk (06/16/2023)    Readmission Risk Interventions    06/17/2023    4:23 PM 04/29/2022   10:26 AM 03/14/2022   12:48 PM  Readmission Risk Prevention Plan  Transportation Screening Complete Complete Complete  PCP or Specialist Appt within 3-5 Days   Complete  HRI or Home Care Consult Complete Complete Complete  Social Work Consult for Recovery Care Planning/Counseling Complete Complete Complete  Palliative Care Screening Not Applicable Not Applicable Not Applicable  Medication Review Oceanographer) Complete Complete Complete

## 2023-06-29 NOTE — Progress Notes (Signed)
Mobility Specialist - Progress Note   06/29/23 0815  Mobility  Activity Ambulated with assistance in hallway  Level of Assistance Standby assist, set-up cues, supervision of patient - no hands on  Assistive Device Cane  Distance Ambulated (ft) 1000 ft  Activity Response Tolerated well  Mobility Referral Yes  $Mobility charge 1 Mobility  Mobility Specialist Start Time (ACUTE ONLY) 0754  Mobility Specialist Stop Time (ACUTE ONLY) 0813  Mobility Specialist Time Calculation (min) (ACUTE ONLY) 19 min   Pt sitting in recliner on RA upon arrival. Pt STS and ambulates to/from Medical Mall SBA with no physical assistance. Pt returns to recliner with needs in reach.   Terrilyn Saver  Mobility Specialist  06/29/23 8:16 AM

## 2023-06-29 NOTE — Progress Notes (Signed)
Brief rounding note  Patient was discharged 06/26/23, but is appealing the discharge. See TOC notes. He remains medically stable for discharge.  Reports ongoing intermittent abdominal pain and nausea which is chronic.   Tolerating meals.  Continues to ambulate well with mobility techs, no longer requiring assistance.  No acute issues, has remained medically stable.  Plan remains as outlined in prior notes and discharge summary of 06/26/23.  Patient reports feeling better.  Asked if he is ready to go home, states yes but asks if he can stay until tomorrow.  His reconsideration for denial of appeal of discharge is due by tomorrow.  He prefers to stay until tomorrow.    General exam: awake, alert, no acute distress HEENT: moist mucus membranes, hearing grossly normal  Respiratory system: on room air, normal respiratory effort. Cardiovascular system: RRR, no edema Central nervous system: no gross focal neurologic deficits, normal speech Extremities: stable contracted RUE, no edema, normal tone

## 2023-06-30 NOTE — Discharge Summary (Signed)
  Physician Discharge Summary    See discharge summary dated 06/26/2023  Patient appealed his discharge. It was denied.  He asked for reconsideration of that denial which was denied today.    Interim timeframe patient remained medically stable for discharge with no acute medical needs.

## 2023-06-30 NOTE — TOC Progression Note (Signed)
Transition of Care (TOC) - Progression Note    Patient Details  Name: Douglas Peterson. MRN: [id number] Date of Birth: 1951/01/11  Transition of Care [facility]) CM/SW Contact  [name], RN Phone Number: 06/30/2023, 9:02 AM  Clinical Narrative:     Reconsideration pending        Expected Discharge Plan and Services         Expected Discharge Date: 06/26/23                                     Social Determinants of Health (SDOH) Interventions SDOH Screenings   Food Insecurity: No Food Insecurity (06/16/2023)  Housing: Low Risk  (06/16/2023)  Transportation Needs: No Transportation Needs (06/16/2023)  Utilities: Not At Risk (06/16/2023)  Financial Resource Strain: Unknown (06/13/2019)  Physical Activity: Unknown (06/13/2019)  Social Connections: Unknown (06/13/2019)  Stress: Unknown (06/13/2019)  Tobacco Use: Medium Risk (06/16/2023)    Readmission Risk Interventions    06/17/2023    4:23 PM 04/29/2022   10:26 AM 03/14/2022   12:48 PM  Readmission Risk Prevention Plan  Transportation Screening Complete Complete Complete  PCP or Specialist Appt within 3-5 Days   Complete  HRI or Home Care Consult Complete Complete Complete  Social Work Consult for Recovery Care Planning/Counseling Complete Complete Complete  Palliative Care Screening Not Applicable Not Applicable Not Applicable  Medication Review Oceanographer) Complete Complete Complete

## 2023-06-30 NOTE — Progress Notes (Signed)
Mobility Specialist - Progress Note   06/30/23 0800  Mobility  Activity Ambulated with assistance in hallway  Level of Assistance Standby assist, set-up cues, supervision of patient - no hands on  Assistive Device Cane  Distance Ambulated (ft) 600 ft  Activity Response Tolerated well  $Mobility charge 1 Mobility     Pt sitting in recliner upon arrival, utilizing RA. Able to don mesh underwear, shoes, and socks without assist. Ambulated in hallway with supervision. C/o pain in R foot but pain does alleviate some with ambulation. Pt returned to chair with breakfast tray set-up.    [name] Mobility Specialist 06/30/23, 8:51 AM

## 2023-06-30 NOTE — Plan of Care (Signed)

## 2023-06-30 NOTE — Progress Notes (Signed)
Physical Therapy Treatment Patient Details Name: Douglas Peterson. MRN: [id number] DOB: Aug 25, 1951 Today's Date: 06/30/2023   History of Present Illness 72 y.o. 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     Patient states that he has nausea, vomiting, abdominal pain in the past 3 days.  He has several episodes of nonbilious nonbloody vomiting.  Denies diarrhea.  His abdominal pain is located in central and upper abdomen, intermittent, mild to moderate, aching, nonradiating.  Pt was here for similar 8/11.    PT Comments  Pt seen this pm for continued functional mobility, gait training with SPC, and balance challenges in standing. No LOB noted, appears to be improving. Pt did endorse concerns about returning home alone. Will continue to follow and progress acutely.    If plan is discharge home, recommend the following: Assistance with cooking/housework;Direct supervision/assist for medications management;Direct supervision/assist for financial management;Assist for transportation;Help with stairs or ramp for entrance;Supervision due to cognitive status;A little help with walking and/or transfers;A little help with bathing/dressing/bathroom   Can travel by private vehicle     Yes  Equipment Recommendations  None recommended by PT    Recommendations for Other Services       Precautions / Restrictions Precautions Precautions: Fall Restrictions Weight Bearing Restrictions: No     Mobility  Bed Mobility Overal bed mobility: Modified Independent Bed Mobility: Supine to Sit     Supine to sit: Supervision, Used rails, HOB elevated          Transfers Overall transfer level: Modified independent Equipment used: Straight  cane Transfers: Sit to/from Stand Sit to Stand: Modified independent (Device/Increase time)   Step pivot transfers: Supervision       General transfer comment:  (Supervision for safety)    Ambulation/Gait Ambulation/Gait assistance: Supervision Gait Distance (Feet): 1000 Feet Assistive device: Straight cane Gait Pattern/deviations: Step-through pattern, Staggering left, Staggering right Gait velocity: decreased     General Gait Details: no LOB this date. tolerated increased gait distance well.   Stairs             Wheelchair Mobility     Tilt Bed    Modified Rankin (Stroke Patients Only)       Balance Overall balance assessment: Needs assistance Sitting-balance support: Single extremity supported, Feet supported Sitting balance-Leahy Scale: Good     Standing balance support: Single extremity supported, During functional activity, Reliant on assistive device for balance Standing balance-Leahy Scale: Fair                              Cognition Arousal: Alert Behavior During Therapy: WFL for tasks assessed/performed Overall Cognitive Status: Within Functional Limits for tasks assessed                                 General Comments: Pt is A and O x 4. Seems to be at baseline cognition.        Exercises      General Comments General comments (skin integrity, edema, etc.): Discussed pt's concerns about returning home too soon.      Pertinent Vitals/Pain Pain Assessment Pain Assessment: No/denies pain    Home Living  Prior Function            PT Goals (current goals can now be found in the care plan section) Acute Rehab PT Goals Patient Stated Goal: go home when I'm ready Progress towards PT goals: Progressing toward goals    Frequency    Min 1X/week      PT Plan Current plan remains appropriate    Co-evaluation              AM-PAC PT "6 Clicks" Mobility    Outcome Measure  Help needed turning from your back to your side while in a flat bed without using bedrails?: None Help needed moving from lying on your back to sitting on the side of a flat bed without using bedrails?: A Little Help needed moving to and from a bed to a chair (including a wheelchair)?: A Little Help needed standing up from a chair using your arms (e.g., wheelchair or bedside chair)?: A Little Help needed to walk in hospital room?: A Little Help needed climbing 3-5 steps with a railing? : A Little 6 Click Score: 19    End of Session Equipment Utilized During Treatment: Gait belt Activity Tolerance: Patient tolerated treatment well Patient left: in chair;with call bell/phone within reach;with chair alarm set Nurse Communication: Mobility status PT Visit Diagnosis: Unsteadiness on feet (R26.81);Difficulty in walking, not elsewhere classified (R26.2);Muscle weakness (generalized) (M62.81)     Time: 1440-1506 PT Time Calculation (min) (ACUTE ONLY): 26 min  Charges:    $Gait Training: 8-22 mins $Therapeutic Activity: 8-22 mins PT General Charges $$ ACUTE PT VISIT: 1 Visit                    Zadie Cleverly, PTA  Jannet Askew 06/30/2023, 4:05 PM

## 2023-06-30 NOTE — TOC Transition Note (Signed)
Transition of Care [facility]) - CM/SW Discharge Note   Patient Details  Name: Douglas Peterson. MRN: [id number] Date of Birth: 12-30-1950  Transition of Care [facility]) CM/SW Contact:  [name], RN Phone Number: 06/30/2023, 4:06 PM   Clinical Narrative:    Notified by [name] that patient has lost reconsideration and patient assumes responsibility after 8/24.  Patient notified,  He declines to complete 3rd level of appeal.  Patient to discharge today  Cab voucher on chart.  [employer] waiver on file          Patient Goals and CMS Choice      Discharge Placement                         Discharge Plan and Services Additional resources added to the After Visit Summary for                                       Social Determinants of Health (SDOH) Interventions SDOH Screenings   Food Insecurity: No Food Insecurity (06/16/2023)  Housing: Low Risk  (06/16/2023)  Transportation Needs: No Transportation Needs (06/16/2023)  Utilities: Not At Risk (06/16/2023)  Financial Resource Strain: Unknown (06/13/2019)  Physical Activity: Unknown (06/13/2019)  Social Connections: Unknown (06/13/2019)  Stress: Unknown (06/13/2019)  Tobacco Use: Medium Risk (06/16/2023)     Readmission Risk Interventions    06/17/2023    4:23 PM 04/29/2022   10:26 AM 03/14/2022   12:48 PM  Readmission Risk Prevention Plan  Transportation Screening Complete Complete Complete  PCP or Specialist Appt within 3-5 Days   Complete  HRI or Home Care Consult Complete Complete Complete  Social Work Consult for Recovery Care Planning/Counseling Complete Complete Complete  Palliative Care Screening Not Applicable Not Applicable Not Applicable  Medication Review Oceanographer) Complete Complete Complete

## 2023-07-10 ENCOUNTER — Inpatient Hospital Stay
Admission: EM | Admit: 2023-07-10 | Discharge: 2023-07-17 | DRG: 641 | Disposition: A | Discharge status: Home / Self Care | Payer: Medicare HMO | Attending: Internal Medicine | Admitting: Internal Medicine

## 2023-07-10 ENCOUNTER — Other Ambulatory Visit: Payer: Self-pay

## 2023-07-10 DIAGNOSIS — N400 Enlarged prostate without lower urinary tract symptoms: Secondary | ICD-10-CM | POA: Diagnosis present

## 2023-07-10 DIAGNOSIS — Z8249 Family history of ischemic heart disease and other diseases of the circulatory system: Secondary | ICD-10-CM

## 2023-07-10 DIAGNOSIS — I5032 Chronic diastolic (congestive) heart failure: Secondary | ICD-10-CM | POA: Diagnosis present

## 2023-07-10 DIAGNOSIS — E878 Other disorders of electrolyte and fluid balance, not elsewhere classified: Secondary | ICD-10-CM | POA: Diagnosis present

## 2023-07-10 DIAGNOSIS — E785 Hyperlipidemia, unspecified: Secondary | ICD-10-CM | POA: Diagnosis present

## 2023-07-10 DIAGNOSIS — I480 Paroxysmal atrial fibrillation: Secondary | ICD-10-CM | POA: Diagnosis present

## 2023-07-10 DIAGNOSIS — L89321 Pressure ulcer of left buttock, stage 1: Secondary | ICD-10-CM | POA: Diagnosis present

## 2023-07-10 DIAGNOSIS — F1010 Alcohol abuse, uncomplicated: Secondary | ICD-10-CM | POA: Diagnosis present

## 2023-07-10 DIAGNOSIS — L89311 Pressure ulcer of right buttock, stage 1: Secondary | ICD-10-CM | POA: Diagnosis present

## 2023-07-10 DIAGNOSIS — I110 Hypertensive heart disease with heart failure: Secondary | ICD-10-CM | POA: Diagnosis present

## 2023-07-10 DIAGNOSIS — Z79899 Other long term (current) drug therapy: Secondary | ICD-10-CM

## 2023-07-10 DIAGNOSIS — Z7984 Long term (current) use of oral hypoglycemic drugs: Secondary | ICD-10-CM

## 2023-07-10 DIAGNOSIS — F32A Depression, unspecified: Secondary | ICD-10-CM | POA: Diagnosis present

## 2023-07-10 DIAGNOSIS — F10129 Alcohol abuse with intoxication, unspecified: Secondary | ICD-10-CM | POA: Diagnosis present

## 2023-07-10 DIAGNOSIS — Z23 Encounter for immunization: Secondary | ICD-10-CM

## 2023-07-10 DIAGNOSIS — E871 Hypo-osmolality and hyponatremia: Secondary | ICD-10-CM | POA: Diagnosis not present

## 2023-07-10 DIAGNOSIS — G839 Paralytic syndrome, unspecified: Secondary | ICD-10-CM | POA: Diagnosis present

## 2023-07-10 DIAGNOSIS — D72829 Elevated white blood cell count, unspecified: Secondary | ICD-10-CM | POA: Diagnosis present

## 2023-07-10 DIAGNOSIS — F10929 Alcohol use, unspecified with intoxication, unspecified: Secondary | ICD-10-CM

## 2023-07-10 DIAGNOSIS — Z87891 Personal history of nicotine dependence: Secondary | ICD-10-CM

## 2023-07-10 DIAGNOSIS — K4090 Unilateral inguinal hernia, without obstruction or gangrene, not specified as recurrent: Secondary | ICD-10-CM | POA: Diagnosis present

## 2023-07-10 DIAGNOSIS — Z7982 Long term (current) use of aspirin: Secondary | ICD-10-CM

## 2023-07-10 DIAGNOSIS — K219 Gastro-esophageal reflux disease without esophagitis: Secondary | ICD-10-CM | POA: Diagnosis present

## 2023-07-10 DIAGNOSIS — E860 Dehydration: Secondary | ICD-10-CM | POA: Diagnosis present

## 2023-07-10 DIAGNOSIS — R7989 Other specified abnormal findings of blood chemistry: Secondary | ICD-10-CM | POA: Diagnosis present

## 2023-07-10 DIAGNOSIS — R197 Diarrhea, unspecified: Secondary | ICD-10-CM | POA: Diagnosis present

## 2023-07-10 DIAGNOSIS — G8191 Hemiplegia, unspecified affecting right dominant side: Secondary | ICD-10-CM | POA: Diagnosis present

## 2023-07-10 DIAGNOSIS — L8990 Pressure ulcer of unspecified site, unspecified stage: Secondary | ICD-10-CM | POA: Diagnosis present

## 2023-07-10 DIAGNOSIS — M24541 Contracture, right hand: Secondary | ICD-10-CM | POA: Diagnosis present

## 2023-07-10 DIAGNOSIS — Y908 Blood alcohol level of 240 mg/100 ml or more: Secondary | ICD-10-CM | POA: Diagnosis present

## 2023-07-10 DIAGNOSIS — Z801 Family history of malignant neoplasm of trachea, bronchus and lung: Secondary | ICD-10-CM

## 2023-07-10 DIAGNOSIS — Z87820 Personal history of traumatic brain injury: Secondary | ICD-10-CM

## 2023-07-10 DIAGNOSIS — I10 Essential (primary) hypertension: Secondary | ICD-10-CM | POA: Diagnosis present

## 2023-07-10 LAB — COMPREHENSIVE METABOLIC PANEL
ALT: 59 U/L — ABNORMAL HIGH (ref 0–44)
AST: 91 U/L — ABNORMAL HIGH (ref 15–41)
Albumin: 4.4 g/dL (ref 3.5–5.0)
Alkaline Phosphatase: 105 U/L (ref 38–126)
Anion gap: 17 — ABNORMAL HIGH (ref 5–15)
BUN: <5 mg/dL — ABNORMAL LOW (ref 8–23)
CO2: 24 mmol/L (ref 22–32)
Calcium: 8.4 mg/dL — ABNORMAL LOW (ref 8.9–10.3)
Chloride: 84 mmol/L — ABNORMAL LOW (ref 98–111)
Creatinine, Ser: 0.38 mg/dL — ABNORMAL LOW (ref 0.61–1.24)
GFR, Estimated: >60 mL/min (ref >60)
Glucose, Bld: 84 mg/dL (ref 70–99)
Potassium: 4.6 mmol/L (ref 3.5–5.1)
Sodium: 125 mmol/L — ABNORMAL LOW (ref 135–145)
Total Bilirubin: 1.8 mg/dL — ABNORMAL HIGH (ref 0.3–1.2)
Total Protein: 8.1 g/dL (ref 6.5–8.1)

## 2023-07-10 LAB — CBG MONITORING, ED: Glucose-Capillary: 100 mg/dL — ABNORMAL HIGH (ref 70–99)

## 2023-07-10 LAB — LIPASE, BLOOD: Lipase: 33 U/L (ref 11–51)

## 2023-07-10 LAB — MAGNESIUM: Magnesium: 2 mg/dL (ref 1.7–2.4)

## 2023-07-10 NOTE — ED Triage Notes (Signed)
Pt also c/o right shoulder pain.

## 2023-07-10 NOTE — ED Triage Notes (Signed)
Pt called ems after he fell hitting his head on the dresser. Pt sts he drunk 6 beers today and sts her drinks 6-7 beers approx 5 day out the week.

## 2023-07-11 ENCOUNTER — Emergency Department: Payer: Medicare HMO

## 2023-07-11 DIAGNOSIS — K4090 Unilateral inguinal hernia, without obstruction or gangrene, not specified as recurrent: Secondary | ICD-10-CM | POA: Diagnosis present

## 2023-07-11 DIAGNOSIS — Z87820 Personal history of traumatic brain injury: Secondary | ICD-10-CM | POA: Diagnosis not present

## 2023-07-11 DIAGNOSIS — K219 Gastro-esophageal reflux disease without esophagitis: Secondary | ICD-10-CM | POA: Diagnosis present

## 2023-07-11 DIAGNOSIS — L89311 Pressure ulcer of right buttock, stage 1: Secondary | ICD-10-CM | POA: Diagnosis present

## 2023-07-11 DIAGNOSIS — L89321 Pressure ulcer of left buttock, stage 1: Secondary | ICD-10-CM | POA: Diagnosis present

## 2023-07-11 DIAGNOSIS — E871 Hypo-osmolality and hyponatremia: Secondary | ICD-10-CM | POA: Diagnosis present

## 2023-07-11 DIAGNOSIS — F10129 Alcohol abuse with intoxication, unspecified: Secondary | ICD-10-CM | POA: Diagnosis present

## 2023-07-11 DIAGNOSIS — I5032 Chronic diastolic (congestive) heart failure: Secondary | ICD-10-CM | POA: Diagnosis present

## 2023-07-11 DIAGNOSIS — E878 Other disorders of electrolyte and fluid balance, not elsewhere classified: Secondary | ICD-10-CM | POA: Diagnosis present

## 2023-07-11 DIAGNOSIS — G839 Paralytic syndrome, unspecified: Secondary | ICD-10-CM | POA: Diagnosis present

## 2023-07-11 DIAGNOSIS — I480 Paroxysmal atrial fibrillation: Secondary | ICD-10-CM | POA: Diagnosis present

## 2023-07-11 DIAGNOSIS — Z23 Encounter for immunization: Secondary | ICD-10-CM | POA: Diagnosis present

## 2023-07-11 DIAGNOSIS — Y908 Blood alcohol level of 240 mg/100 ml or more: Secondary | ICD-10-CM | POA: Diagnosis present

## 2023-07-11 DIAGNOSIS — N400 Enlarged prostate without lower urinary tract symptoms: Secondary | ICD-10-CM | POA: Diagnosis present

## 2023-07-11 DIAGNOSIS — R7989 Other specified abnormal findings of blood chemistry: Secondary | ICD-10-CM

## 2023-07-11 DIAGNOSIS — Z8249 Family history of ischemic heart disease and other diseases of the circulatory system: Secondary | ICD-10-CM | POA: Diagnosis not present

## 2023-07-11 DIAGNOSIS — F1010 Alcohol abuse, uncomplicated: Secondary | ICD-10-CM | POA: Diagnosis present

## 2023-07-11 DIAGNOSIS — Z79899 Other long term (current) drug therapy: Secondary | ICD-10-CM | POA: Diagnosis not present

## 2023-07-11 DIAGNOSIS — F32A Depression, unspecified: Secondary | ICD-10-CM | POA: Diagnosis present

## 2023-07-11 DIAGNOSIS — Z7984 Long term (current) use of oral hypoglycemic drugs: Secondary | ICD-10-CM | POA: Diagnosis not present

## 2023-07-11 DIAGNOSIS — I10 Essential (primary) hypertension: Secondary | ICD-10-CM | POA: Diagnosis not present

## 2023-07-11 DIAGNOSIS — E785 Hyperlipidemia, unspecified: Secondary | ICD-10-CM | POA: Diagnosis present

## 2023-07-11 DIAGNOSIS — I110 Hypertensive heart disease with heart failure: Secondary | ICD-10-CM | POA: Diagnosis present

## 2023-07-11 DIAGNOSIS — G8191 Hemiplegia, unspecified affecting right dominant side: Secondary | ICD-10-CM | POA: Diagnosis present

## 2023-07-11 DIAGNOSIS — D72829 Elevated white blood cell count, unspecified: Secondary | ICD-10-CM | POA: Diagnosis present

## 2023-07-11 DIAGNOSIS — L8990 Pressure ulcer of unspecified site, unspecified stage: Secondary | ICD-10-CM | POA: Diagnosis present

## 2023-07-11 DIAGNOSIS — Z87891 Personal history of nicotine dependence: Secondary | ICD-10-CM | POA: Diagnosis not present

## 2023-07-11 DIAGNOSIS — E860 Dehydration: Secondary | ICD-10-CM | POA: Diagnosis present

## 2023-07-11 DIAGNOSIS — Z7982 Long term (current) use of aspirin: Secondary | ICD-10-CM | POA: Diagnosis not present

## 2023-07-11 LAB — CBC
HCT: 40.6 % (ref 39.0–52.0)
Hemoglobin: 14.6 g/dL (ref 13.0–17.0)
MCH: 31.1 pg (ref 26.0–34.0)
MCHC: 36 g/dL (ref 30.0–36.0)
MCV: 86.6 fL (ref 80.0–100.0)
Platelets: 186 10*3/uL (ref 150–400)
RBC: 4.69 MIL/uL (ref 4.22–5.81)
RDW: 12.2 % (ref 11.5–15.5)
WBC: 12.1 10*3/uL — ABNORMAL HIGH (ref 4.0–10.5)
nRBC: 0 % (ref 0.0–0.2)

## 2023-07-11 LAB — CBC WITH DIFFERENTIAL/PLATELET
Abs Immature Granulocytes: 0.03 10*3/uL (ref 0.00–0.07)
Basophils Absolute: 0.1 10*3/uL (ref 0.0–0.1)
Basophils Relative: 0 %
Eosinophils Absolute: 0 10*3/uL (ref 0.0–0.5)
Eosinophils Relative: 0 %
HCT: 46.7 % (ref 39.0–52.0)
Hemoglobin: 16.5 g/dL (ref 13.0–17.0)
Immature Granulocytes: 0 %
Lymphocytes Relative: 61 %
Lymphs Abs: 8.2 10*3/uL — ABNORMAL HIGH (ref 0.7–4.0)
MCH: 31.3 pg (ref 26.0–34.0)
MCHC: 35.3 g/dL (ref 30.0–36.0)
MCV: 88.4 fL (ref 80.0–100.0)
Monocytes Absolute: 0.6 10*3/uL (ref 0.1–1.0)
Monocytes Relative: 4 %
Neutro Abs: 4.8 10*3/uL (ref 1.7–7.7)
Neutrophils Relative %: 35 %
Platelets: 250 10*3/uL (ref 150–400)
RBC: 5.28 MIL/uL (ref 4.22–5.81)
RDW: 12.3 % (ref 11.5–15.5)
Smear Review: NORMAL
WBC: 13.6 10*3/uL — ABNORMAL HIGH (ref 4.0–10.5)
nRBC: 0 % (ref 0.0–0.2)

## 2023-07-11 LAB — BASIC METABOLIC PANEL
Anion gap: 12 (ref 5–15)
BUN: <5 mg/dL — ABNORMAL LOW (ref 8–23)
CO2: 25 mmol/L (ref 22–32)
Calcium: 7.8 mg/dL — ABNORMAL LOW (ref 8.9–10.3)
Chloride: 94 mmol/L — ABNORMAL LOW (ref 98–111)
Creatinine, Ser: 0.42 mg/dL — ABNORMAL LOW (ref 0.61–1.24)
GFR, Estimated: >60 mL/min (ref >60)
Glucose, Bld: 161 mg/dL — ABNORMAL HIGH (ref 70–99)
Potassium: 3.1 mmol/L — ABNORMAL LOW (ref 3.5–5.1)
Sodium: 131 mmol/L — ABNORMAL LOW (ref 135–145)

## 2023-07-11 LAB — NA AND K (SODIUM & POTASSIUM), RAND UR
Potassium Urine: 13 mmol/L
Sodium, Ur: <10 mmol/L

## 2023-07-11 LAB — TSH: TSH: 1.102 u[IU]/mL (ref 0.350–4.500)

## 2023-07-11 LAB — ETHANOL: Alcohol, Ethyl (B): 340 mg/dL (ref <10)

## 2023-07-11 MED ORDER — ONDANSETRON HCL 4 MG/2ML IJ SOLN
4.0000 mg | Freq: Four times a day (QID) | INTRAMUSCULAR | Status: DC | PRN
  Administered 2023-07-11 – 2023-07-12 (×2): 4 mg via INTRAVENOUS
  Filled 2023-07-11 (×3): qty 2

## 2023-07-11 MED ORDER — MELATONIN 5 MG PO TABS
5.0000 mg | ORAL_TABLET | Freq: Every day | ORAL | Status: DC
  Administered 2023-07-11 – 2023-07-16 (×6): 5 mg via ORAL
  Filled 2023-07-11 (×6): qty 1

## 2023-07-11 MED ORDER — TAMSULOSIN HCL 0.4 MG PO CAPS
0.4000 mg | ORAL_CAPSULE | Freq: Every day | ORAL | Status: DC
  Administered 2023-07-11 – 2023-07-17 (×7): 0.4 mg via ORAL
  Filled 2023-07-11 (×7): qty 1

## 2023-07-11 MED ORDER — ENOXAPARIN SODIUM 40 MG/0.4ML IJ SOSY
40.0000 mg | PREFILLED_SYRINGE | INTRAMUSCULAR | Status: DC
  Administered 2023-07-11 – 2023-07-15 (×5): 40 mg via SUBCUTANEOUS
  Filled 2023-07-11 (×5): qty 0.4

## 2023-07-11 MED ORDER — THIAMINE HCL 100 MG/ML IJ SOLN
Freq: Once | INTRAVENOUS | Status: DC
Start: 2023-07-11 — End: 2023-07-11

## 2023-07-11 MED ORDER — SODIUM CHLORIDE 0.9 % IV SOLN: INTRAVENOUS | Status: DC

## 2023-07-11 MED ORDER — TRAZODONE HCL 50 MG PO TABS
25.0000 mg | ORAL_TABLET | Freq: Every evening | ORAL | Status: DC | PRN
  Administered 2023-07-11 – 2023-07-12 (×2): 25 mg via ORAL
  Filled 2023-07-11 (×2): qty 1

## 2023-07-11 MED ORDER — ACETAMINOPHEN 325 MG RE SUPP: 650.0000 mg | Freq: Four times a day (QID) | RECTAL | Status: DC | PRN

## 2023-07-11 MED ORDER — THIAMINE HCL 100 MG PO TABS
100.0000 mg | ORAL_TABLET | Freq: Every day | ORAL | Status: DC
  Administered 2023-07-11 – 2023-07-17 (×7): 100 mg via ORAL
  Filled 2023-07-11 (×14): qty 1

## 2023-07-11 MED ORDER — THIAMINE HCL 100 MG/ML IJ SOLN
100.0000 mg | Freq: Once | INTRAMUSCULAR | Status: AC
  Administered 2023-07-11: 100 mg via INTRAVENOUS
  Filled 2023-07-11: qty 2

## 2023-07-11 MED ORDER — POLYETHYLENE GLYCOL 3350 17 G PO PACK
17.0000 g | PACK | Freq: Every day | ORAL | Status: DC | PRN
  Administered 2023-07-15: 17 g via ORAL
  Filled 2023-07-11: qty 1

## 2023-07-11 MED ORDER — SODIUM CHLORIDE 0.9 % IV BOLUS
500.0000 mL | Freq: Once | INTRAVENOUS | Status: AC
  Administered 2023-07-11: 500 mL via INTRAVENOUS

## 2023-07-11 MED ORDER — FOLIC ACID 1 MG PO TABS
1.0000 mg | ORAL_TABLET | Freq: Every day | ORAL | Status: DC
  Administered 2023-07-11 – 2023-07-17 (×7): 1 mg via ORAL
  Filled 2023-07-11 (×7): qty 1

## 2023-07-11 MED ORDER — ATORVASTATIN CALCIUM 20 MG PO TABS: 10.0000 mg | ORAL_TABLET | Freq: Every day | ORAL | Status: DC

## 2023-07-11 MED ORDER — BACLOFEN 10 MG PO TABS
10.0000 mg | ORAL_TABLET | Freq: Three times a day (TID) | ORAL | Status: DC
  Administered 2023-07-11 – 2023-07-17 (×19): 10 mg via ORAL
  Filled 2023-07-11 (×19): qty 1

## 2023-07-11 MED ORDER — THIAMINE HCL 100 MG PO TABS
100.0000 mg | ORAL_TABLET | Freq: Every day | ORAL | Status: DC
  Filled 2023-07-11: qty 1

## 2023-07-11 MED ORDER — FOLIC ACID 1 MG PO TABS: 1.0000 mg | ORAL_TABLET | Freq: Every day | ORAL | Status: DC

## 2023-07-11 MED ORDER — ACETAMINOPHEN 325 MG PO TABS
650.0000 mg | ORAL_TABLET | Freq: Four times a day (QID) | ORAL | Status: DC | PRN
  Administered 2023-07-11 – 2023-07-13 (×5): 650 mg via ORAL
  Filled 2023-07-11 (×6): qty 2

## 2023-07-11 MED ORDER — DEXTROSE-SODIUM CHLORIDE 5-0.9 % IV SOLN
1000.0000 mL | Freq: Once | INTRAVENOUS | Status: AC
  Administered 2023-07-11: 1000 mL via INTRAVENOUS

## 2023-07-11 MED ORDER — ENSURE ENLIVE PO LIQD
237.0000 mL | Freq: Two times a day (BID) | ORAL | Status: DC
  Administered 2023-07-11 – 2023-07-17 (×12): 237 mL via ORAL

## 2023-07-11 MED ORDER — ADULT MULTIVITAMIN W/MINERALS CH
1.0000 | ORAL_TABLET | Freq: Every day | ORAL | Status: DC
  Administered 2023-07-11 – 2023-07-17 (×7): 1 via ORAL
  Filled 2023-07-11 (×7): qty 1

## 2023-07-11 MED ORDER — ADULT MULTIVITAMIN W/MINERALS CH: 1.0000 | ORAL_TABLET | Freq: Every day | ORAL | Status: DC

## 2023-07-11 MED ORDER — ATENOLOL 25 MG PO TABS
25.0000 mg | ORAL_TABLET | Freq: Two times a day (BID) | ORAL | Status: DC
  Administered 2023-07-11 – 2023-07-13 (×4): 25 mg via ORAL
  Filled 2023-07-11 (×5): qty 1

## 2023-07-11 MED ORDER — ONDANSETRON HCL 4 MG PO TABS
4.0000 mg | ORAL_TABLET | Freq: Four times a day (QID) | ORAL | Status: DC | PRN
  Administered 2023-07-11 – 2023-07-16 (×6): 4 mg via ORAL
  Filled 2023-07-11 (×6): qty 1

## 2023-07-11 MED ORDER — ASPIRIN 325 MG PO TABS
325.0000 mg | ORAL_TABLET | Freq: Every day | ORAL | Status: DC
  Administered 2023-07-11 – 2023-07-16 (×6): 325 mg via ORAL
  Filled 2023-07-11 (×9): qty 1

## 2023-07-11 MED ORDER — MAGNESIUM HYDROXIDE 400 MG/5ML PO SUSP: 30.0000 mL | Freq: Every day | ORAL | Status: DC | PRN

## 2023-07-11 MED ORDER — ALUM & MAG HYDROXIDE-SIMETH 200-200-20 MG/5ML PO SUSP
30.0000 mL | ORAL | Status: DC | PRN
  Administered 2023-07-11: 30 mL via ORAL
  Filled 2023-07-11: qty 30

## 2023-07-11 NOTE — Assessment & Plan Note (Signed)
Beer potomania Na++ 125 on presentation, now up to 134 Stable

## 2023-07-11 NOTE — Assessment & Plan Note (Signed)
Given elevated LFTs we will hold off on statin therapy.

## 2023-07-11 NOTE — Assessment & Plan Note (Signed)
-   We will continue Flomax 

## 2023-07-11 NOTE — Assessment & Plan Note (Signed)
-   Continue home atenolol

## 2023-07-11 NOTE — Assessment & Plan Note (Signed)
No absolute cessation Continue CIWA protocol Continue banana bag daily Complaining of nausea - will add Protonix and Carafate for possible alcoholic gastritis

## 2023-07-11 NOTE — ED Provider Notes (Signed)
[facility] Provider Note    Event Date/Time   First MD Initiated Contact with Patient 07/10/23 2259     (approximate)   History   Fall   HPI Douglas Peterson. is a 72 y.o. male who is well-known to the emergency department and who presents for evaluation of alcohol intoxication as well as generalized weakness and fall.  He said he just does not feel right.  No fever chest pain or shortness of breath.  Some nausea, no vomiting.  The history is able to provide is limited.  He has chronic alcohol dependence and frequent ED visits for alcohol intoxication.  He is frequently hyponatremic.  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     Physical Exam   Triage Vital Signs: ED Triage Vitals [07/10/23 2301]  Encounter Vitals Group     BP 128/69     Systolic BP Percentile      Diastolic BP Percentile      Pulse Rate 87     Resp 20     Temp 98.5 F (36.9 C)     Temp Source Oral     SpO2 95 %     Weight      Height      Head Circumference      Peak Flow      Pain Score      Pain Loc      Pain Education      Exclude from Growth Chart     Most recent vital signs: Vitals:   07/10/23 2301 07/11/23 0000  BP: 128/69 137/77  Pulse: 87 95  Resp: 20 17  Temp: 98.5 F (36.9 C)   SpO2: 95%     General: Awake, appears intoxicated, no obvious distress at this time. CV:  Good peripheral perfusion.  Regular rate and rhythm.  Normal heart sounds. Resp:  Normal effort. no accessory muscle usage nor intercostal retractions.  Lungs clear to auscultation bilaterally. Abd:  No distention.  No tenderness to palpation of the abdomen. Other:  Patient's mood and affect are somewhat reserved and he appears intoxicated.  [name] reporting some pain in his right arm but he is chronically contractured.  He is at his baseline and has no visible or palpable deformities and no new tenderness to palpation or manipulation of the right  upper extremity including the clavicle, shoulder, upper arm, elbow, lower arm, and wrist.   ED Results / Procedures / Treatments   Labs (all labs ordered are listed, but only abnormal results are displayed) Labs Reviewed  COMPREHENSIVE METABOLIC PANEL - Abnormal; Notable for the following components:      Result Value   Sodium 125 (*)    Chloride 84 (*)    BUN <5 (*)    Creatinine, Ser 0.38 (*)    Calcium 8.4 (*)    AST 91 (*)    ALT 59 (*)    Total Bilirubin 1.8 (*)    Anion gap 17 (*)    All other components within normal limits  CBC WITH DIFFERENTIAL/PLATELET - Abnormal; Notable for the following components:   WBC 13.6 (*)    All other components within normal limits  ETHANOL - Abnormal; Notable for the following components:   Alcohol, Ethyl (B) 340 (*)    All other components within normal limits  CBG MONITORING, ED - Abnormal; Notable for the following components:   Glucose-Capillary 100 (*)    All  other components within normal limits  MAGNESIUM  LIPASE, BLOOD     EKG  ED ECG REPORT I, Loleta Rose, the attending physician, personally viewed and interpreted this ECG.  Date: 07/11/2023 EKG Time: 22: 58 Rate: 89 Rhythm: normal sinus rhythm QRS Axis: normal Intervals: normal ST/T Wave abnormalities: Non-specific ST segment / T-wave changes, but no clear evidence of acute ischemia. Narrative Interpretation: no definitive evidence of acute ischemia; does not meet STEMI criteria.  Heavy artifact makes fine determinations difficult.    RADIOLOGY I viewed and interpreted the patient's 1 view chest x-ray.  I see no evidence of pneumonia, aspiration or otherwise.  I also read the radiologist's report, which confirmed no acute findings.   PROCEDURES:  Critical Care performed: No  .1-3 Lead EKG Interpretation  Performed by: Loleta Rose, MD Authorized by: Loleta Rose, MD     Interpretation: normal     ECG rate:  95   ECG rate assessment: normal      Rhythm: sinus rhythm     Ectopy: none     Conduction: normal       IMPRESSION / MDM / ASSESSMENT AND PLAN / ED COURSE  I reviewed the triage vital signs and the nursing notes.                              Differential diagnosis includes, but is not limited to, metabolic or electrolyte abnormality, intoxication, acute traumatic injury, CVA.  Patient's presentation is most consistent with acute presentation with potential threat to life or bodily function.  Labs/studies ordered: Portable chest x-ray, EKG, CBC with differential, BMP, ethanol level  Interventions/Medications given:  Medications - No data to display  (Note:  hospital course my include additional interventions and/or labs/studies not listed above.)   Acute hyponatremia, low glucose, no evidence of acidosis but concerning for beer Potomania.  Patient also acutely intoxicated.  Will treat with D5 normal saline bolus 1 L at 250 mL/h.  Also treating with thiamine 100 mg IV.  Will consult the hospitalist team for admission.  The patient is on the cardiac monitor to evaluate for evidence of arrhythmia and/or significant heart rate changes.   Clinical Course as of 07/11/23 0606  Sat Jul 11, 2023  0040 Consulted with Dr. Arville Care with the hospitalist service.  He will admit for further management. [CF]    Clinical Course User Index [CF] Loleta Rose, MD     FINAL CLINICAL IMPRESSION(S) / ED DIAGNOSES   Final diagnoses:  Hyponatremia  Alcoholic intoxication with complication [facility])     Rx / DC Orders   ED Discharge Orders     None        Note:  This document was prepared using Dragon voice recognition software and may include unintentional dictation errors.   Loleta Rose, MD 07/11/23 [phone]

## 2023-07-11 NOTE — Progress Notes (Signed)
Patient known to our service from multiple admissions for ongoing alcohol abuse, dehydration and hyponatremia presented with nausea and vomiting. Has been drinking significant amount. Alcohol level 340   no signs of withdrawal noted. Patient sodium up from 125 to 134. Tolerating PO diet. PT OT to see patient. TOC for discharge planning.  No charge

## 2023-07-11 NOTE — Plan of Care (Signed)
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
## 2023-07-12 DIAGNOSIS — E871 Hypo-osmolality and hyponatremia: Secondary | ICD-10-CM | POA: Diagnosis not present

## 2023-07-12 LAB — OSMOLALITY, URINE: Osmolality, Ur: 173 mosm/kg — ABNORMAL LOW (ref 300–900)

## 2023-07-12 LAB — OSMOLALITY: Osmolality: 344 mosm/kg (ref 275–295)

## 2023-07-12 NOTE — Progress Notes (Signed)
Mobility Specialist - Progress Note   07/12/23 0923  Mobility  Activity Transferred from bed to chair  Level of Assistance Minimal assist, patient does 75% or more  Assistive Device Cane  Distance Ambulated (ft) 5 ft  Activity Response Tolerated well  Mobility Referral Yes  $Mobility charge 1 Mobility  Mobility Specialist Start Time (ACUTE ONLY) [id number]  Mobility Specialist Stop Time (ACUTE ONLY) 0854  Mobility Specialist Time Calculation (min) (ACUTE ONLY) 28 min   Pt semi-supine in bed on RA upon arrival. Pt completes bed mobility HHA with extra time. Pt STS and ambulates from EOB to recliner CGA with no LOB noted but generally unsteady throughout. Pt left in recliner with needs in reach and chair alarm activated.   Terrilyn Saver  Mobility Specialist  07/12/23 9:25 AM

## 2023-07-12 NOTE — Progress Notes (Signed)
Mobility Specialist - Progress Note   07/12/23 1112  Mobility  Activity Ambulated with assistance in hallway  Level of Assistance Contact guard assist, steadying assist  Assistive Device Cane  Distance Ambulated (ft) 60 ft  Activity Response Tolerated well  Mobility Referral Yes  $Mobility charge 1 Mobility  Mobility Specialist Start Time (ACUTE ONLY) 1055  Mobility Specialist Stop Time (ACUTE ONLY) 1108  Mobility Specialist Time Calculation (min) (ACUTE ONLY) 13 min   Pt sitting in recliner on RA upon arrival. Pt STS and ambulates in hallway CGA with two LOB corrected by Thereasa Parkin. Pt returns to bed with needs in reach and bed alarm activated.   Terrilyn Saver  Mobility Specialist  07/12/23 11:13 AM

## 2023-07-12 NOTE — Progress Notes (Signed)
[facility]  - [place] [facility]   PATIENT NAME: Douglas Peterson    MR#:  [id number]  DATE OF BIRTH:  September 23, 1951  SUBJECTIVE:      VITALS:  Blood pressure 139/68, pulse (!) 58, temperature 97.8 F (36.6 C), temperature source Oral, resp. rate 16, height 5\' 3"  (1.6 m), weight 58 kg, SpO2 95%.  PHYSICAL EXAMINATION:   GENERAL:  72 y.o.-year-old patient with no acute distress.  LUNGS: Normal breath sounds bilaterally, no wheezing CARDIOVASCULAR: S1, S2 normal. No murmur   ABDOMEN: Soft, nontender, nondistended. Bowel sounds present.  EXTREMITIES: No  edema b/l.    NEUROLOGIC: nonfocal  patient is alert and awake SKIN: No obvious rash, lesion, or ulcer.   LABORATORY PANEL:  CBC Recent Labs  Lab 07/11/23 0432  WBC 12.1*  HGB 14.6  HCT 40.6  PLT 186    Chemistries  Recent Labs  Lab 07/10/23 2302 07/11/23 0432  NA 125* 131*  K 4.6 3.1*  CL 84* 94*  CO2 24 25  GLUCOSE 84 161*  BUN <5* <5*  CREATININE 0.38* 0.42*  CALCIUM 8.4* 7.8*  MG 2.0  --   AST 91*  --   ALT 59*  --   ALKPHOS 105  --   BILITOT 1.8*  --    Cardiac Enzymes No results for input(s): "TROPONINI" in the last 168 hours. RADIOLOGY:  DG Chest Portable 1 View  Result Date: 07/11/2023 CLINICAL DATA:  Status post fall with subsequent right shoulder pain. EXAM: PORTABLE CHEST 1 VIEW COMPARISON:  June 16, 2023 FINDINGS: The heart size and mediastinal contours are within normal limits. Low lung volumes are noted. There is no evidence of acute infiltrate, pleural effusion or pneumothorax. Chronic fracture deformities are seen involving the distal right clavicle and proximal right humerus. Chronic bilateral rib fractures are also noted. No acute osseous abnormality is identified. IMPRESSION: Low lung volumes without acute or active cardiopulmonary disease. Electronically Signed   By: Aram Candela M.D.   On: 07/11/2023 00:58    Assessment and Plan  Guerin Velarde. is a 72 y.o. 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 She he had drank about 18 beers prior to coming to ER. His blood alcohol level was 340   Hyponatremia in the setting of dehydration from nausea vomiting with ongoing alcohol abuse -- patient received IV fluids -- came in with sodium 125--- 134 -- patient lost his IV however he is tolerating PO diet well. IV fluids discontinued. Patient advised to continue PO oral intake  Ongoing alcohol abuse -- placed on alcohol withdrawal protocol -- remains stable. No signs of withdrawal  Hypertension -- on atenolol  BPH -- continue Flomax  PT OT to see patient. [name] medically best at baseline for discharge.  Procedures: Family communication : none Consults : none CODE STATUS: full DVT Prophylaxis : Lovenox Level of care: Telemetry Medical Status is: Inpatient Remains inpatient appropriate because: PT OT to see    TOTAL TIME TAKING CARE OF THIS PATIENT: 35 minutes.  >50% time spent on counselling and coordination of care  Note: This dictation was prepared with Dragon dictation along with smaller phrase technology. Any transcriptional errors that result from this process are unintentional.  Enedina Finner  M.D    [facility]   CC: Primary care physician; Melonie Florida, FNP

## 2023-07-12 NOTE — Plan of Care (Signed)

## 2023-07-13 DIAGNOSIS — E871 Hypo-osmolality and hyponatremia: Secondary | ICD-10-CM | POA: Diagnosis not present

## 2023-07-13 MED ORDER — ATENOLOL 25 MG PO TABS: 25.0000 mg | ORAL_TABLET | Freq: Every day | ORAL | 0 refills | Status: DC

## 2023-07-13 MED ORDER — ATENOLOL 25 MG PO TABS
25.0000 mg | ORAL_TABLET | Freq: Every day | ORAL | Status: DC
  Administered 2023-07-14 – 2023-07-17 (×4): 25 mg via ORAL
  Filled 2023-07-13 (×4): qty 1

## 2023-07-13 NOTE — Care Management Important Message (Signed)
Important Message  Patient Details  Name: Douglas Peterson. MRN: [id number] Date of Birth: 07/25/1951   Medicare Important Message Given:  N/A - LOS <3 / Initial given by admissions     [name] 07/13/2023, 9:40 AM

## 2023-07-13 NOTE — TOC Progression Note (Addendum)
Transition of Care (TOC) - Progression Note    Patient Details  Name: Douglas Peterson. MRN: [id number] Date of Birth: June 26, 1951  Transition of Care [facility]) CM/SW Contact  Marquita Palms, LCSW Phone Number: 07/13/2023, 11:29 AM  Clinical Narrative:     CSW spoke with patient and asked about if the appeal was something he asked about, patient replied "yes."  CSW help patient called to appeal, appeal process started.  [id number] and DND completed and provided to patient per [facility] secretary. Patient has not officially appealed yet and there is no case number as patient reports having water in his ear. MD updated.       Expected Discharge Plan and Services         Expected Discharge Date: 07/13/23                                     Social Determinants of Health (SDOH) Interventions SDOH Screenings   Food Insecurity: No Food Insecurity (07/11/2023)  Housing: Low Risk  (07/11/2023)  Transportation Needs: No Transportation Needs (07/11/2023)  Utilities: Not At Risk (07/11/2023)  Financial Resource Strain: Unknown (06/13/2019)  Physical Activity: Unknown (06/13/2019)  Social Connections: Unknown (06/13/2019)  Stress: Unknown (06/13/2019)  Tobacco Use: Medium Risk (07/10/2023)    Readmission Risk Interventions    06/17/2023    4:23 PM 04/29/2022   10:26 AM 03/14/2022   12:48 PM  Readmission Risk Prevention Plan  Transportation Screening Complete Complete Complete  PCP or Specialist Appt within 3-5 Days   Complete  HRI or Home Care Consult Complete Complete Complete  Social Work Consult for Recovery Care Planning/Counseling Complete Complete Complete  Palliative Care Screening Not Applicable Not Applicable Not Applicable  Medication Review Oceanographer) Complete Complete Complete

## 2023-07-13 NOTE — Discharge Instructions (Signed)
Abstain from drinking alcohol °

## 2023-07-13 NOTE — Plan of Care (Signed)

## 2023-07-13 NOTE — Progress Notes (Signed)
Mobility Specialist - Progress Note   07/13/23 0852  Mobility  Activity Ambulated with assistance in hallway;Stood at bedside;Dangled on edge of bed  Level of Assistance Contact guard assist, steadying assist  Assistive Device Cane  Distance Ambulated (ft) 400 ft  Activity Response Tolerated well  Mobility Referral Yes  $Mobility charge 1 Mobility  Mobility Specialist Start Time (ACUTE ONLY) 0818  Mobility Specialist Stop Time (ACUTE ONLY) 0841  Mobility Specialist Time Calculation (min) (ACUTE ONLY) 23 min   Pt supine in bed on RA upon arrival. Pt completes bed mobility Supervision. Pt STS and ambulates in hallway CGA with no LOB noted. Pt returns to recliner with needs in reach, chair alarm activated, and MD in room.   Terrilyn Saver  Mobility Specialist  07/13/23 8:54 AM

## 2023-07-13 NOTE — TOC Progression Note (Signed)
Transition of Care (TOC) - Progression Note    Patient Details  Name: Douglas Peterson. MRN: [id number] Date of Birth: 17-Nov-1950  Transition of Care [facility]) CM/SW Contact  [name] Phone Number: 07/13/2023, 1:06 PM  [facility] Appeal Detailed Notice of Discharge letter created and saved: Yes (Completed by Darolyn Rua, LCSW) Detailed Notice of Discharge Document Given to Pateint: Yes (Completed by Darolyn Rua, LCSW) [employer] ROI Document Created: Yes Kepro appeal documents uploaded to Kepro stite: Yes (Upload Confirmation:[id number])   Case ID: [id number]

## 2023-07-14 DIAGNOSIS — E871 Hypo-osmolality and hyponatremia: Secondary | ICD-10-CM | POA: Diagnosis not present

## 2023-07-14 MED ORDER — OXYMETAZOLINE HCL 0.05 % NA SOLN
1.0000 | Freq: Two times a day (BID) | NASAL | Status: DC
  Administered 2023-07-14 – 2023-07-17 (×5): 1 via NASAL
  Filled 2023-07-14: qty 15

## 2023-07-14 NOTE — TOC Progression Note (Signed)
Transition of Care (TOC) - Progression Note    Patient Details  Name: Douglas Peterson. MRN: [id number] Date of Birth: 12-12-1950  Transition of Care [facility]) CM/SW Contact  [name], RN Phone Number: 07/14/2023, 10:53 AM  Clinical Narrative:      Appeal status pending for discharge       Expected Discharge Plan and Services         Expected Discharge Date: 07/13/23                                     Social Determinants of Health (SDOH) Interventions SDOH Screenings   Food Insecurity: No Food Insecurity (07/11/2023)  Housing: Low Risk  (07/11/2023)  Transportation Needs: No Transportation Needs (07/11/2023)  Utilities: Not At Risk (07/11/2023)  Financial Resource Strain: Unknown (06/13/2019)  Physical Activity: Unknown (06/13/2019)  Social Connections: Unknown (06/13/2019)  Stress: Unknown (06/13/2019)  Tobacco Use: Medium Risk (07/10/2023)    Readmission Risk Interventions    06/17/2023    4:23 PM 04/29/2022   10:26 AM 03/14/2022   12:48 PM  Readmission Risk Prevention Plan  Transportation Screening Complete Complete Complete  PCP or Specialist Appt within 3-5 Days   Complete  HRI or Home Care Consult Complete Complete Complete  Social Work Consult for Recovery Care Planning/Counseling Complete Complete Complete  Palliative Care Screening Not Applicable Not Applicable Not Applicable  Medication Review Oceanographer) Complete Complete Complete

## 2023-07-14 NOTE — Progress Notes (Addendum)
[facility]  - [place] at [facility]   PATIENT NAME: Douglas Peterson    MR#:  [id number]  DATE OF BIRTH:  18-Apr-1951  SUBJECTIVE:   Eating well Ambulated with MT 400 ft yesterday. Uses cane  no family.  [name] appeal has been DENIED   VITALS:  Blood pressure (!) 168/89, pulse (!) 59, temperature 97.8 F (36.6 C), temperature source Oral, resp. rate 14, height 5\' 3"  (1.6 m), weight 58 kg, SpO2 97%.  PHYSICAL EXAMINATION:   GENERAL:  72 y.o.-year-old patient with no acute distress.  LUNGS: Normal breath sounds bilaterally, no wheezing CARDIOVASCULAR: S1, S2 normal. No murmur   ABDOMEN: Soft, nontender, nondistended.  EXTREMITIES: No  edema b/l.    NEUROLOGIC:  patient is alert and awake chronic right sided weakness   LABORATORY PANEL:  CBC Recent Labs  Lab 07/11/23 0432  WBC 12.1*  HGB 14.6  HCT 40.6  PLT 186    Chemistries  Recent Labs  Lab 07/10/23 2302 07/11/23 0432  NA 125* 131*  K 4.6 3.1*  CL 84* 94*  CO2 24 25  GLUCOSE 84 161*  BUN <5* <5*  CREATININE 0.38* 0.42*  CALCIUM 8.4* 7.8*  MG 2.0  --   AST 91*  --   ALT 59*  --   ALKPHOS 105  --   BILITOT 1.8*  --    Cardiac Enzymes No results for input(s): "TROPONINI" in the last 168 hours. RADIOLOGY:  No results found.  Assessment and Plan  Douglas Peterson. is a 72 y.o. 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 She he had drank about 18 beers prior to coming to ER. His blood alcohol level was 340   Hyponatremia in the setting of dehydration from nausea vomiting with ongoing alcohol abuse -- patient received IV fluids -- came in with sodium 125--- 134 -- patient lost his IV however he is  tolerating PO diet well. IV fluids discontinued. Patient advised to continue PO oral intake  Ongoing alcohol abuse -- placed on alcohol withdrawal protocol -- remains stable. No signs of withdrawal  Hypertension -- on atenolol  BPH -- continue Flomax  Patient ambulating around the nurses station without any difficulty using cane along with mobility therapist. [name] medically best at baseline for discharge.  Per TOC first appeal has been denied. Per [facility] director Jiles Crocker patient has the right to do second appeal. Continue to keep him here in the hospital till the decision is made of the second appeal that already has been made.   Family communication : none Consults : none CODE STATUS: full DVT Prophylaxis : Lovenox Level of care: Telemetry Medical Status is: Inpatient Remains inpatient appropriate because: TOC for discharge planning  TOTAL TIME TAKING CARE OF THIS PATIENT: 25 minutes.  >50% time spent on counselling and coordination of care  Note: This dictation was prepared with Dragon dictation along with smaller phrase technology. Any transcriptional errors that result from this process are unintentional.  Douglas Peterson M.D    [facility]   CC: Primary care physician; Melonie Florida, FNP

## 2023-07-14 NOTE — Plan of Care (Signed)
  Problem: Education: Goal: Knowledge of General Education information will improve Description: Including pain rating scale, medication(s)/side effects and non-pharmacologic comfort measures Outcome: Progressing   Problem: Clinical Measurements: Goal: Ability to maintain clinical measurements within normal limits will improve Outcome: Progressing Goal: Respiratory complications will improve Outcome: Progressing   Problem: Activity: Goal: Risk for activity intolerance will decrease Outcome: Progressing   Problem: Nutrition: Goal: Adequate nutrition will be maintained Outcome: Progressing   

## 2023-07-14 NOTE — TOC Progression Note (Signed)
Transition of Care (TOC) - Progression Note    Patient Details  Name: Douglas Peterson. MRN: [id number] Date of Birth: 1951/07/17  Transition of Care [facility] CM/SW Contact  [name], RN Phone Number: 07/14/2023, 3:00 PM  Clinical Narrative:     Per [name] Appeal was denied.  Patient notified.  Patient has filed reconsideration.   Case id [id number]  [name] aware that he will be financial responsible starting tomorrow 9/11 at 12pm   Patient requests assistance for arranging PCS services. Patient request for referral to be made to [facility].  Referral made to [facility] with [facility].  Per Cammy Copa a rep will come to the hospital tomorrow for assessment and paper work     Expected Discharge Plan and Services         Expected Discharge Date: 07/13/23                                     Social Determinants of Health (SDOH) Interventions SDOH Screenings   Food Insecurity: No Food Insecurity (07/11/2023)  Housing: Low Risk  (07/11/2023)  Transportation Needs: No Transportation Needs (07/11/2023)  Utilities: Not At Risk (07/11/2023)  Financial Resource Strain: Unknown (06/13/2019)  Physical Activity: Unknown (06/13/2019)  Social Connections: Unknown (06/13/2019)  Stress: Unknown (06/13/2019)  Tobacco Use: Medium Risk (07/10/2023)    Readmission Risk Interventions    06/17/2023    4:23 PM 04/29/2022   10:26 AM 03/14/2022   12:48 PM  Readmission Risk Prevention Plan  Transportation Screening Complete Complete Complete  PCP or Specialist Appt within 3-5 Days   Complete  HRI or Home Care Consult Complete Complete Complete  Social Work Consult for Recovery Care Planning/Counseling Complete Complete Complete  Palliative Care Screening Not Applicable Not Applicable Not Applicable  Medication Review Oceanographer) Complete Complete Complete

## 2023-07-14 NOTE — Progress Notes (Signed)
Mobility Specialist - Progress Note   07/14/23 1100  Mobility  Activity Ambulated with assistance in hallway  Level of Assistance Contact guard assist, steadying assist  Assistive Device Cane  Distance Ambulated (ft) 480 ft  Activity Response Tolerated well  $Mobility charge 1 Mobility     Pt lying in bed upon arrival, utilizing RA. Pt agreeable to activity. Completed bed mobility with supervision. STS with minA and ambulation with CGA. Pt voiced fear of falling and increased instability this date. No LOB throughout session. Pt also voiced mild dizziness and nausea. Pt returned to room with alarm set, needs in reach.    [name] Mobility Specialist 07/14/23, 11:10 AM

## 2023-07-14 NOTE — Progress Notes (Signed)
[facility]  - [place] at [facility]   PATIENT NAME: Douglas Peterson    MR#:  [id number]  DATE OF BIRTH:  04-14-1951  SUBJECTIVE:   Eating well no family. Pt has appealed to his insurance   VITALS:  Blood pressure (!) 168/89, pulse (!) 59, temperature 97.8 F (36.6 C), temperature source Oral, resp. rate 14, height 5\' 3"  (1.6 m), weight 58 kg, SpO2 97%.  PHYSICAL EXAMINATION:   GENERAL:  72 y.o.-year-old patient with no acute distress.  LUNGS: Normal breath sounds bilaterally, no wheezing CARDIOVASCULAR: S1, S2 normal. No murmur   ABDOMEN: Soft, nontender, nondistended.  EXTREMITIES: No  edema b/l.    NEUROLOGIC:  patient is alert and awake chronic right sided weakness   LABORATORY PANEL:  CBC Recent Labs  Lab 07/11/23 0432  WBC 12.1*  HGB 14.6  HCT 40.6  PLT 186    Chemistries  Recent Labs  Lab 07/10/23 2302 07/11/23 0432  NA 125* 131*  K 4.6 3.1*  CL 84* 94*  CO2 24 25  GLUCOSE 84 161*  BUN <5* <5*  CREATININE 0.38* 0.42*  CALCIUM 8.4* 7.8*  MG 2.0  --   AST 91*  --   ALT 59*  --   ALKPHOS 105  --   BILITOT 1.8*  --    Cardiac Enzymes No results for input(s): "TROPONINI" in the last 168 hours. RADIOLOGY:  No results found.  Assessment and Plan  Douglas Kvam. is a 72 y.o. 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 She he had drank about 18 beers prior to coming to ER. His blood alcohol level was 340   Hyponatremia in the setting of dehydration from nausea vomiting with ongoing alcohol abuse -- patient received IV fluids -- came in with sodium 125--- 134 -- patient lost his IV however he is tolerating PO diet well. IV fluids discontinued.  Patient advised to continue PO oral intake  Ongoing alcohol abuse -- placed on alcohol withdrawal protocol -- remains stable. No signs of withdrawal  Hypertension -- on atenolol  BPH -- continue Flomax  PT OT to see patient. [name] medically best at baseline for discharge.  Procedures: Family communication : none Consults : none CODE STATUS: full DVT Prophylaxis : Lovenox Level of care: Telemetry Medical Status is: Inpatient Remains inpatient appropriate because: PT OT to see    TOTAL TIME TAKING CARE OF THIS PATIENT: 35 minutes.  >50% time spent on counselling and coordination of care  Note: This dictation was prepared with Dragon dictation along with smaller phrase technology. Any transcriptional errors that result from this process are unintentional.  Enedina Finner M.D    [facility]   CC: Primary care physician; Melonie Florida, FNP

## 2023-07-14 NOTE — Plan of Care (Signed)

## 2023-07-15 DIAGNOSIS — E871 Hypo-osmolality and hyponatremia: Secondary | ICD-10-CM | POA: Diagnosis not present

## 2023-07-15 LAB — CBC WITH DIFFERENTIAL/PLATELET
Abs Immature Granulocytes: 0.01 10*3/uL (ref 0.00–0.07)
Basophils Absolute: 0 10*3/uL (ref 0.0–0.1)
Basophils Relative: 1 %
Eosinophils Absolute: 0.2 10*3/uL (ref 0.0–0.5)
Eosinophils Relative: 3 %
HCT: 42.8 % (ref 39.0–52.0)
Hemoglobin: 14.6 g/dL (ref 13.0–17.0)
Immature Granulocytes: 0 %
Lymphocytes Relative: 41 %
Lymphs Abs: 2.4 10*3/uL (ref 0.7–4.0)
MCH: 31.8 pg (ref 26.0–34.0)
MCHC: 34.1 g/dL (ref 30.0–36.0)
MCV: 93.2 fL (ref 80.0–100.0)
Monocytes Absolute: 0.5 10*3/uL (ref 0.1–1.0)
Monocytes Relative: 8 %
Neutro Abs: 2.7 10*3/uL (ref 1.7–7.7)
Neutrophils Relative %: 47 %
Platelets: 78 10*3/uL — ABNORMAL LOW (ref 150–400)
RBC: 4.59 MIL/uL (ref 4.22–5.81)
RDW: 12.8 % (ref 11.5–15.5)
WBC: 5.7 10*3/uL (ref 4.0–10.5)
nRBC: 0 % (ref 0.0–0.2)

## 2023-07-15 LAB — COMPREHENSIVE METABOLIC PANEL
ALT: 46 U/L — ABNORMAL HIGH (ref 0–44)
AST: 48 U/L — ABNORMAL HIGH (ref 15–41)
Albumin: 3.7 g/dL (ref 3.5–5.0)
Alkaline Phosphatase: 73 U/L (ref 38–126)
Anion gap: 10 (ref 5–15)
BUN: 10 mg/dL (ref 8–23)
CO2: 25 mmol/L (ref 22–32)
Calcium: 9 mg/dL (ref 8.9–10.3)
Chloride: 99 mmol/L (ref 98–111)
Creatinine, Ser: 0.51 mg/dL — ABNORMAL LOW (ref 0.61–1.24)
GFR, Estimated: >60 mL/min (ref >60)
Glucose, Bld: 139 mg/dL — ABNORMAL HIGH (ref 70–99)
Potassium: 3.3 mmol/L — ABNORMAL LOW (ref 3.5–5.1)
Sodium: 134 mmol/L — ABNORMAL LOW (ref 135–145)
Total Bilirubin: 0.9 mg/dL (ref 0.3–1.2)
Total Protein: 6.8 g/dL (ref 6.5–8.1)

## 2023-07-15 MED ORDER — PANTOPRAZOLE SODIUM 40 MG PO TBEC: 40.0000 mg | DELAYED_RELEASE_TABLET | Freq: Every day | ORAL | 1 refills | Status: DC

## 2023-07-15 MED ORDER — POTASSIUM CHLORIDE CRYS ER 20 MEQ PO TBCR
40.0000 meq | EXTENDED_RELEASE_TABLET | Freq: Once | ORAL | Status: AC
  Administered 2023-07-15: 40 meq via ORAL
  Filled 2023-07-15: qty 2

## 2023-07-15 MED ORDER — PANTOPRAZOLE SODIUM 40 MG PO TBEC
40.0000 mg | DELAYED_RELEASE_TABLET | Freq: Every day | ORAL | Status: DC
  Administered 2023-07-15 – 2023-07-17 (×3): 40 mg via ORAL
  Filled 2023-07-15 (×3): qty 1

## 2023-07-15 MED ORDER — SUCRALFATE 1 G PO TABS
1.0000 g | ORAL_TABLET | Freq: Three times a day (TID) | ORAL | Status: DC
  Administered 2023-07-15 – 2023-07-17 (×6): 1 g via ORAL
  Filled 2023-07-15 (×6): qty 1

## 2023-07-15 MED ORDER — SUCRALFATE 1 G PO TABS: 1.0000 g | ORAL_TABLET | Freq: Three times a day (TID) | ORAL | 1 refills | Status: DC

## 2023-07-15 NOTE — Progress Notes (Signed)
Progress Note   Patient: Douglas Peterson. WUJ:[id number] DOB: [birth year] DOA: 07/10/2023     4 DOS: the patient was seen and examined on 07/15/2023   Brief hospital course: 72yo with h/o ETOH use d/o, afib not on AC, HTN, and hyponatremia who presented on 9/7 with n/v/d.  He was noted to have hyponatremia, which has corrected from 125 to 134.  He was placed on CIWA but did not have evidence of withdrawal.  He is ambulating with a cane and is medically stable for discharge but he is appealing insurance denial for placement.  Assessment and Plan: * Hyponatremia Beer potomania Na++ 125 on presentation, now up to 134 Stable  Dyslipidemia Given elevated LFTs we will hold off on statin therapy.  Essential hypertension Continue home atenolol  BPH (benign prostatic hyperplasia) Continue Flomax.  Alcohol abuse No absolute cessation Continue CIWA protocol Continue banana bag daily Complaining of nausea - will add Protonix and Carafate for possible alcoholic gastritis  Pressure injury of skin Pressure Injury 07/11/23 Buttocks Bilateral Stage 1 -  Intact skin with non-blanchable redness of a localized area usually over a bony prominence. MASD/redness (Active)  07/11/23 0219  Location: Buttocks  Location Orientation: Bilateral  Staging: Stage 1 -  Intact skin with non-blanchable redness of a localized area usually over a bony prominence.  Wound Description (Comments): MASD/redness  Present on Admission: Yes    Elevated LFTs Likely associated with alcoholic hepatitis Back to relative baseline     Note: He is medically stable for dc but is appealing his discharge.   Consultants: TOC team  Procedures: None  Antibiotics: None  30 Day Unplanned Readmission Risk Score    Flowsheet Row ED to Hosp-Admission (Current) from 07/10/2023 in [facility]  30 Day Unplanned Readmission Risk Score (%) 25.69 Filed at 07/15/2023 0801       This score  is the patient's risk of an unplanned readmission within 30 days of being discharged (0 -100%). The score is based on dignosis, age, lab data, medications, orders, and past utilization.   Low:  0-14.9   Medium: 15-21.9   High: 22-29.9   Extreme: 30 and above           Subjective: He reports that he feels terrible today.  He has nausea and is generally miserable.   Objective: Vitals:   07/15/23 0755 07/15/23 0804  BP: (!) 172/83 (!) 158/71  Pulse: (!) 59 65  Resp: 18   Temp: 98 F (36.7 C)   SpO2: 95%     Intake/Output Summary (Last 24 hours) at 07/15/2023 1407 Last data filed at 07/15/2023 0900 Gross per 24 hour  Intake 240 ml  Output 900 ml  Net -660 ml   Filed Weights   07/11/23 0237 07/15/23 0336  Weight: 58 kg 59.2 kg    Exam:  General:  Appears calm and comfortable and is in NAD Eyes:  EOMI, normal lids, iris ENT:  grossly normal hearing, lips & tongue, mmm Neck:  no LAD, masses or thyromegaly Cardiovascular:  RRR, no m/r/g. No LE edema.  Respiratory:   CTA bilaterally with no wheezes/rales/rhonchi.  Normal respiratory effort. Abdomen:  soft, NT, ND Skin:  no rash or induration seen on limited exam Musculoskeletal:  grossly normal tone BUE/BLE, good ROM, no bony abnormality Psychiatric:  grossly normal mood and affect, speech fluent and appropriate, AOx3 Neurologic:  CN 2-12 grossly intact, moves all extremities in coordinated fashion  Data Reviewed: I have reviewed the  patient's lab results since admission.  Pertinent labs for today include:   Na++ 134, up from 125 K+ 3.3 Glucose 139 AST 48/ALT 46 WBC 5.7 Platelets 78     Family Communication: None present; I called and left a message for his daughter  Disposition: Status is: Inpatient Remains inpatient appropriate because: patient is appealing discharge     Time spent: 35 minutes  Unresulted Labs (From admission, onward)    None        Author: Jonah Blue, MD 07/15/2023 2:07  PM  For on call review [email or web address].

## 2023-07-15 NOTE — Assessment & Plan Note (Signed)
Pressure Injury 07/11/23 Buttocks Bilateral Stage 1 -  Intact skin with non-blanchable redness of a localized area usually over a bony prominence. MASD/redness (Active)  07/11/23 0219  Location: Buttocks  Location Orientation: Bilateral  Staging: Stage 1 -  Intact skin with non-blanchable redness of a localized area usually over a bony prominence.  Wound Description (Comments): MASD/redness  Present on Admission: Yes

## 2023-07-15 NOTE — Plan of Care (Signed)

## 2023-07-15 NOTE — Plan of Care (Signed)
  Problem: Clinical Measurements: Goal: Ability to maintain clinical measurements within normal limits will improve Outcome: Progressing   Problem: Clinical Measurements: Goal: Will remain free from infection Outcome: Progressing   Problem: Clinical Measurements: Goal: Diagnostic test results will improve Outcome: Progressing   Problem: Clinical Measurements: Goal: Respiratory complications will improve Outcome: Progressing   Problem: Clinical Measurements: Goal: Cardiovascular complication will be avoided Outcome: Progressing   

## 2023-07-15 NOTE — Hospital Course (Signed)
72yo with h/o ETOH use d/o, afib not on AC, HTN, and hyponatremia who presented on 9/7 with n/v/d.  He was noted to have hyponatremia, which has corrected from 125 to 134.  He was placed on CIWA but did not have evidence of withdrawal.  He is ambulating with a cane and is medically stable for discharge but he is appealing insurance denial for placement.

## 2023-07-15 NOTE — TOC Progression Note (Signed)
Transition of Care (TOC) - Progression Note    Patient Details  Name: Douglas Peterson. MRN: [id number] Date of Birth: 06/23/1951  Transition of Care [facility]) CM/SW Contact  [name], RN Phone Number: 07/15/2023, 12:44 PM  Clinical Narrative:     Reconsideration still in process        Expected Discharge Plan and Services         Expected Discharge Date: 07/13/23                                     Social Determinants of Health (SDOH) Interventions SDOH Screenings   Food Insecurity: No Food Insecurity (07/11/2023)  Housing: Low Risk  (07/11/2023)  Transportation Needs: No Transportation Needs (07/11/2023)  Utilities: Not At Risk (07/11/2023)  Financial Resource Strain: Unknown (06/13/2019)  Physical Activity: Unknown (06/13/2019)  Social Connections: Unknown (06/13/2019)  Stress: Unknown (06/13/2019)  Tobacco Use: Medium Risk (07/10/2023)    Readmission Risk Interventions    06/17/2023    4:23 PM 04/29/2022   10:26 AM 03/14/2022   12:48 PM  Readmission Risk Prevention Plan  Transportation Screening Complete Complete Complete  PCP or Specialist Appt within 3-5 Days   Complete  HRI or Home Care Consult Complete Complete Complete  Social Work Consult for Recovery Care Planning/Counseling Complete Complete Complete  Palliative Care Screening Not Applicable Not Applicable Not Applicable  Medication Review Oceanographer) Complete Complete Complete

## 2023-07-15 NOTE — Assessment & Plan Note (Signed)
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

## 2023-07-15 NOTE — Consult Note (Signed)
PHARMACY CONSULT NOTE - ELECTROLYTES  Pharmacy Consult for Electrolyte Monitoring and Replacement   Recent Labs: Height: 5\' 3"  (160 cm) Weight: 59.2 kg (130 lb 8.2 oz) IBW/kg (Calculated) : 56.9 Estimated Creatinine Clearance: 67.2 mL/min (A) (by C-G formula based on SCr of 0.51 mg/dL (L)). Potassium (mmol/L)  Date Value  07/15/2023 3.3 (L)   Magnesium (mg/dL)  Date Value  16/08/9603 2.0   Calcium (mg/dL)  Date Value  54/07/8118 9.0   Albumin (g/dL)  Date Value  14/78/2956 3.7  08/13/2021 4.6   Phosphorus (mg/dL)  Date Value  21/30/8657 3.4   Sodium (mmol/L)  Date Value  07/15/2023 134 (L)  08/13/2021 138    Assessment  Douglas Speciale. is a 72 y.o. male presenting with hyponatremia in the setting of beer potomania. PMH significant for EtOH use disorder, Afib (not on AC), HTN. Pharmacy has been consulted to monitor and replace electrolytes.  Diet: Heart, feeding supplements MIVF: N/A Pertinent medications: N/A  Goal of Therapy: Electrolytes WNL  Plan:  K 3.3 >> will order potassium chloride 40 mEq PO x 1 No other electrolyte replacement currently warranted Check electrolytes with AM labs  Thank you for allowing pharmacy to be a part of this patient's care.  Will M. Dareen Piano, PharmD Clinical Pharmacist 07/15/2023 4:29 PM

## 2023-07-16 DIAGNOSIS — E871 Hypo-osmolality and hyponatremia: Secondary | ICD-10-CM | POA: Diagnosis not present

## 2023-07-16 LAB — BASIC METABOLIC PANEL
Anion gap: 10 (ref 5–15)
BUN: 10 mg/dL (ref 8–23)
CO2: 24 mmol/L (ref 22–32)
Calcium: 9 mg/dL (ref 8.9–10.3)
Chloride: 98 mmol/L (ref 98–111)
Creatinine, Ser: 0.51 mg/dL — ABNORMAL LOW (ref 0.61–1.24)
GFR, Estimated: >60 mL/min (ref >60)
Glucose, Bld: 146 mg/dL — ABNORMAL HIGH (ref 70–99)
Potassium: 3.4 mmol/L — ABNORMAL LOW (ref 3.5–5.1)
Sodium: 132 mmol/L — ABNORMAL LOW (ref 135–145)

## 2023-07-16 MED ORDER — POTASSIUM CHLORIDE CRYS ER 20 MEQ PO TBCR
20.0000 meq | EXTENDED_RELEASE_TABLET | Freq: Once | ORAL | Status: AC
  Administered 2023-07-16: 20 meq via ORAL
  Filled 2023-07-16: qty 1

## 2023-07-16 MED ORDER — PROMETHAZINE HCL 25 MG RE SUPP
12.5000 mg | Freq: Four times a day (QID) | RECTAL | Status: DC | PRN
  Administered 2023-07-16: 25 mg via RECTAL
  Filled 2023-07-16 (×2): qty 1

## 2023-07-16 NOTE — Plan of Care (Signed)
  Problem: Clinical Measurements: Goal: Ability to maintain clinical measurements within normal limits will improve Outcome: Progressing Goal: Will remain free from infection Outcome: Progressing Goal: Diagnostic test results will improve Outcome: Progressing Goal: Respiratory complications will improve Outcome: Progressing Goal: Cardiovascular complication will be avoided Outcome: Progressing   Problem: Nutrition: Goal: Adequate nutrition will be maintained Outcome: Progressing   

## 2023-07-16 NOTE — TOC Progression Note (Addendum)
Transition of Care (TOC) - Progression Note    Patient Details  Name: Douglas Peterson. MRN: [id number] Date of Birth: May 02, 1951  [facility] CM/SW Contact  [name], RN Phone Number: 07/16/2023, 2:41 PM  Clinical Narrative:    Reconsideration still in process   9/11 [facility] met with patient to discuss options.  He was not willing to commit to [facility] services in the home.  Patient has expressed multiple times this admission that he is not interested in placement at ALF      Expected Discharge Plan and Services         Expected Discharge Date: 07/13/23                                     Social Determinants of Health (SDOH) Interventions SDOH Screenings   Food Insecurity: No Food Insecurity (07/11/2023)  Housing: Low Risk  (07/11/2023)  Transportation Needs: No Transportation Needs (07/11/2023)  Utilities: Not At Risk (07/11/2023)  Financial Resource Strain: Unknown (06/13/2019)  Physical Activity: Unknown (06/13/2019)  Social Connections: Unknown (06/13/2019)  Stress: Unknown (06/13/2019)  Tobacco Use: Medium Risk (07/10/2023)    Readmission Risk Interventions    06/17/2023    4:23 PM 04/29/2022   10:26 AM 03/14/2022   12:48 PM  Readmission Risk Prevention Plan  Transportation Screening Complete Complete Complete  PCP or Specialist Appt within 3-5 Days   Complete  HRI or Home Care Consult Complete Complete Complete  Social Work Consult for Recovery Care Planning/Counseling Complete Complete Complete  Palliative Care Screening Not Applicable Not Applicable Not Applicable  Medication Review Oceanographer) Complete Complete Complete

## 2023-07-16 NOTE — Plan of Care (Signed)

## 2023-07-16 NOTE — Plan of Care (Signed)
  Problem: Education: Goal: Knowledge of General Education information will improve Description: Including pain rating scale, medication(s)/side effects and non-pharmacologic comfort measures 07/16/2023 0455 by Vallery Ridge, RN Outcome: Progressing 07/16/2023 0455 by Vallery Ridge, RN Outcome: Progressing 07/16/2023 0442 by Vallery Ridge, RN Outcome: Progressing   Problem: Clinical Measurements: Goal: Ability to maintain clinical measurements within normal limits will improve 07/16/2023 0455 by Vallery Ridge, RN Outcome: Progressing 07/16/2023 0455 by Vallery Ridge, RN Outcome: Progressing 07/16/2023 0442 by Vallery Ridge, RN Outcome: Progressing Goal: Will remain free from infection 07/16/2023 0455 by Vallery Ridge, RN Outcome: Progressing 07/16/2023 0455 by Vallery Ridge, RN Outcome: Progressing 07/16/2023 0442 by Vallery Ridge, RN Outcome: Progressing Goal: Diagnostic test results will improve 07/16/2023 0455 by Vallery Ridge, RN Outcome: Progressing 07/16/2023 0455 by Vallery Ridge, RN Outcome: Progressing 07/16/2023 0442 by Vallery Ridge, RN Outcome: Progressing Goal: Respiratory complications will improve 07/16/2023 0455 by Vallery Ridge, RN Outcome: Progressing 07/16/2023 0455 by Vallery Ridge, RN Outcome: Progressing 07/16/2023 0442 by Vallery Ridge, RN Outcome: Progressing Goal: Cardiovascular complication will be avoided 07/16/2023 0455 by Vallery Ridge, RN Outcome: Progressing 07/16/2023 0455 by Vallery Ridge, RN Outcome: Progressing 07/16/2023 0442 by Vallery Ridge, RN Outcome: Progressing   Problem: Activity: Goal: Risk for activity intolerance will decrease 07/16/2023 0455 by Vallery Ridge, RN Outcome: Progressing 07/16/2023 0455 by Vallery Ridge, RN Outcome: Progressing 07/16/2023 0442 by Vallery Ridge, RN Outcome: Progressing

## 2023-07-16 NOTE — Plan of Care (Signed)
?  Problem: Health Behavior/Discharge Planning: ?Goal: Ability to manage health-related needs will improve ?Outcome: Progressing ?  ?Problem: Nutrition: ?Goal: Adequate nutrition will be maintained ?Outcome: Progressing ?  ?Problem: Safety: ?Goal: Ability to remain free from injury will improve ?Outcome: Progressing ?  ?Problem: Skin Integrity: ?Goal: Risk for impaired skin integrity will decrease ?Outcome: Progressing ?  ?

## 2023-07-16 NOTE — Progress Notes (Signed)
Progress Note   Patient: Douglas Peterson. ZOX:[id number] DOB: [birth year] DOA: 07/10/2023     5 DOS: the patient was seen and examined on 07/16/2023   Brief hospital course: 72yo with h/o ETOH use d/o, afib not on AC, HTN, and hyponatremia who presented on 9/7 with n/v/d.  He was noted to have hyponatremia, which has corrected from 125 to 134.  He was placed on CIWA but did not have evidence of withdrawal.  He is ambulating with a cane and is medically stable for discharge but he is appealing insurance denial for placement.  Assessment and Plan: * Hyponatremia Beer potomania Na++ 125 on presentation, now up to 134 Stable  Dyslipidemia Given elevated LFTs we will hold off on statin therapy.  Essential hypertension Continue home atenolol  BPH (benign prostatic hyperplasia) Continue Flomax.  Alcohol abuse No absolute cessation Continue CIWA protocol Continue banana bag daily Complaining of nausea - will add Protonix and Carafate for possible alcoholic gastritis  Pressure injury of skin Pressure Injury 07/11/23 Buttocks Bilateral Stage 1 -  Intact skin with non-blanchable redness of a localized area usually over a bony prominence. MASD/redness (Active)  07/11/23 0219  Location: Buttocks  Location Orientation: Bilateral  Staging: Stage 1 -  Intact skin with non-blanchable redness of a localized area usually over a bony prominence.  Wound Description (Comments): MASD/redness  Present on Admission: Yes    Elevated LFTs Likely associated with alcoholic hepatitis Back to relative baseline     Consultants: TOC team   Procedures: None   Antibiotics: None    30 Day Unplanned Readmission Risk Score    Flowsheet Row ED to Hosp-Admission (Current) from 07/10/2023 in [facility]  30 Day Unplanned Readmission Risk Score (%) 25.55 Filed at 07/16/2023 0801       This score is the patient's risk of an unplanned readmission within 30 days of  being discharged (0 -100%). The score is based on dignosis, age, lab data, medications, orders, and past utilization.   Low:  0-14.9   Medium: 15-21.9   High: 22-29.9   Extreme: 30 and above           Subjective: He reports that he just isn't doing well.  He is having nausea - but is eating his tray without difficulty.  He isn't ambulating well, but tolerated mobility on 9/10 with 480 ft of walking.  I explained that he is medically stable and appropriate for discharge if his appeal is denied.   Objective: Vitals:   07/16/23 0304 07/16/23 0824  BP: (!) 147/77 138/69  Pulse: 62 63  Resp: 16 16  Temp: 98.5 F (36.9 C) 98.2 F (36.8 C)  SpO2: 96% 96%    Intake/Output Summary (Last 24 hours) at 07/16/2023 1547 Last data filed at 07/16/2023 1100 Gross per 24 hour  Intake 420 ml  Output 950 ml  Net -530 ml   Filed Weights   07/11/23 0237 07/15/23 0336  Weight: 58 kg 59.2 kg    Exam:  General:  Appears calm and comfortable and is in NAD, somewhat disheveled Eyes:  EOMI, normal lids, iris ENT:  grossly normal hearing, lips & tongue, mmm Neck:  no LAD, masses or thyromegaly Cardiovascular:  RRR, no m/r/g. No LE edema.  Respiratory:   CTA bilaterally with no wheezes/rales/rhonchi.  Normal respiratory effort. Abdomen:  soft, NT, ND Skin:  no rash or induration seen on limited exam Musculoskeletal:  grossly normal tone LUE/BLE, R hand contracture (chronic), no  bony abnormality Psychiatric:  grossly normal mood and affect, speech fluent and appropriate, AOx3 Neurologic:  CN 2-12 grossly intact, moves all extremities in coordinated fashion  Data Reviewed: I have reviewed the patient's lab results since admission.  Pertinent labs for today include:   None today     Family Communication: None present  Disposition: Status is: Inpatient Remains inpatient appropriate because: appealing discharge     Time spent: 35 minutes  Unresulted Labs (From admission, onward)      Start     Ordered   07/17/23 0500  Basic metabolic panel  Tomorrow morning,   R       Question:  Specimen collection method  Answer:  Lab=Lab collect   07/16/23 0938   07/17/23 0500  CBC  Tomorrow morning,   R       Question:  Specimen collection method  Answer:  Lab=Lab collect   07/16/23 2130             Author: Jonah Blue, MD 07/16/2023 3:47 PM  For on call review [email or web address].

## 2023-07-16 NOTE — Consult Note (Signed)
PHARMACY CONSULT NOTE - ELECTROLYTES  Pharmacy Consult for Electrolyte Monitoring and Replacement   Recent Labs: Height: 5\' 3"  (160 cm) Weight: 59.2 kg (130 lb 8.2 oz) IBW/kg (Calculated) : 56.9 Estimated Creatinine Clearance: 67.2 mL/min (A) (by C-G formula based on SCr of 0.51 mg/dL (L)). Potassium (mmol/L)  Date Value  07/16/2023 3.4 (L)   Magnesium (mg/dL)  Date Value  67/89/3810 2.0   Calcium (mg/dL)  Date Value  17/51/0258 9.0   Albumin (g/dL)  Date Value  52/77/8242 3.7  08/13/2021 4.6   Phosphorus (mg/dL)  Date Value  35/36/1443 3.4   Sodium (mmol/L)  Date Value  07/16/2023 132 (L)  08/13/2021 138    Assessment  Douglas Chinen. is a 72 y.o. male presenting with hyponatremia in the setting of beer potomania. PMH significant for EtOH use disorder, Afib (not on AC), HTN. Pharmacy has been consulted to monitor and replace electrolytes.  Diet: Heart, feeding supplements MIVF: N/A Pertinent medications: N/A  Goal of Therapy: Electrolytes WNL  Plan:  K 3.4 >> will order potassium chloride 20 mEq PO x 1 No other electrolyte replacement currently warranted Check electrolytes with AM labs  Thank you for allowing pharmacy to be a part of this patient's care.  Barrie Folk, PharmD Clinical Pharmacist 07/16/2023 9:37 AM

## 2023-07-17 DIAGNOSIS — E871 Hypo-osmolality and hyponatremia: Secondary | ICD-10-CM | POA: Diagnosis not present

## 2023-07-17 LAB — CBC
HCT: 41.7 % (ref 39.0–52.0)
Hemoglobin: 14.4 g/dL (ref 13.0–17.0)
MCH: 31.4 pg (ref 26.0–34.0)
MCHC: 34.5 g/dL (ref 30.0–36.0)
MCV: 91 fL (ref 80.0–100.0)
Platelets: 110 10*3/uL — ABNORMAL LOW (ref 150–400)
RBC: 4.58 MIL/uL (ref 4.22–5.81)
RDW: 13 % (ref 11.5–15.5)
WBC: 6.7 10*3/uL (ref 4.0–10.5)
nRBC: 0 % (ref 0.0–0.2)

## 2023-07-17 LAB — BASIC METABOLIC PANEL
Anion gap: 10 (ref 5–15)
BUN: 16 mg/dL (ref 8–23)
CO2: 26 mmol/L (ref 22–32)
Calcium: 9 mg/dL (ref 8.9–10.3)
Chloride: 96 mmol/L — ABNORMAL LOW (ref 98–111)
Creatinine, Ser: 0.44 mg/dL — ABNORMAL LOW (ref 0.61–1.24)
GFR, Estimated: >60 mL/min (ref >60)
Glucose, Bld: 93 mg/dL (ref 70–99)
Potassium: 4 mmol/L (ref 3.5–5.1)
Sodium: 132 mmol/L — ABNORMAL LOW (ref 135–145)

## 2023-07-17 MED ORDER — INFLUENZA VAC A&B SURF ANT ADJ 0.5 ML IM SUSY
0.5000 mL | PREFILLED_SYRINGE | Freq: Once | INTRAMUSCULAR | Status: AC
  Administered 2023-07-17: 0.5 mL via INTRAMUSCULAR
  Filled 2023-07-17: qty 0.5

## 2023-07-17 MED ORDER — TAMSULOSIN HCL 0.4 MG PO CAPS: 0.4000 mg | ORAL_CAPSULE | Freq: Every day | ORAL | 2 refills | Status: DC

## 2023-07-17 NOTE — Plan of Care (Signed)
Care plan resolved, patient is adequate for discharge.  Douglas Peterson

## 2023-07-17 NOTE — Consult Note (Signed)
PHARMACY CONSULT NOTE - ELECTROLYTES  Pharmacy Consult for Electrolyte Monitoring and Replacement   Recent Labs: Height: 5\' 3"  (160 cm) Weight: 59.2 kg (130 lb 8.2 oz) IBW/kg (Calculated) : 56.9 Estimated Creatinine Clearance: 67.2 mL/min (A) (by C-G formula based on SCr of 0.44 mg/dL (L)). Potassium (mmol/L)  Date Value  07/17/2023 4.0   Magnesium (mg/dL)  Date Value  78/29/5621 2.0   Calcium (mg/dL)  Date Value  30/86/5784 9.0   Albumin (g/dL)  Date Value  69/62/9528 3.7  08/13/2021 4.6   Phosphorus (mg/dL)  Date Value  41/32/4401 3.4   Sodium (mmol/L)  Date Value  07/17/2023 132 (L)  08/13/2021 138    Assessment  Douglas Peterson. is a 72 y.o. male presenting with hyponatremia in the setting of beer potomania. PMH significant for EtOH use disorder, Afib (not on AC), HTN. Pharmacy has been consulted to monitor and replace electrolytes.  Diet: Heart, feeding supplements MIVF: N/A Pertinent medications: N/A  Goal of Therapy: Electrolytes WNL  Plan:  No electrolyte replacement indicated at this time Check electrolytes with AM labs  Thank you for allowing pharmacy to be a part of this patient's care.  Barrie Folk, PharmD Clinical Pharmacist 07/17/2023 6:48 AM

## 2023-07-17 NOTE — Progress Notes (Signed)
[name] to be discharged Home per MD order. Discussed prescriptions and follow up appointments with the patient. Prescriptions given to patient, medication list explained in detail. Patient verbalized understanding.  Allergies as of 07/17/2023       Reactions   Hydrochlorothiazide Other (See Comments)   Hyponatremia        Medication List     TAKE these medications    acetaminophen 500 MG tablet Commonly known as: TYLENOL Take 2 tablets (1,000 mg total) by mouth every 8 (eight) hours as needed for mild pain or moderate pain.   aspirin 325 MG tablet Take 325 mg by mouth daily.   atenolol 25 MG tablet Commonly known as: TENORMIN Take 1 tablet (25 mg total) by mouth daily. What changed: when to take this   atorvastatin 10 MG tablet Commonly known as: LIPITOR Take 1 tablet (10 mg total) by mouth daily.   baclofen 10 MG tablet Commonly known as: LIORESAL Take 1 tablet (10 mg total) by mouth 3 (three) times daily.   feeding supplement Liqd Take 237 mLs by mouth 2 (two) times daily between meals.   folic acid 1 MG tablet Commonly known as: FOLVITE Take 1 tablet (1 mg total) by mouth daily.   melatonin 5 MG Tabs Take 1 tablet (5 mg total) by mouth at bedtime.   multivitamin with minerals Tabs tablet Take 1 tablet by mouth daily.   pantoprazole 40 MG tablet Commonly known as: PROTONIX Take 1 tablet (40 mg total) by mouth daily.   polyethylene glycol 17 g packet Commonly known as: MIRALAX / GLYCOLAX Take 17 g by mouth daily as needed. Mix one tablespoon with 8oz of your favorite juice or water every day until you are having soft formed stools. Then start taking once daily if you didn't have a stool the day before.   sucralfate 1 g tablet Commonly known as: CARAFATE Take 1 tablet (1 g total) by mouth 4 (four) times daily -  with meals and at bedtime.   tamsulosin 0.4 MG Caps capsule Commonly known as: FLOMAX Take 1 capsule (0.4 mg total) by mouth daily.    thiamine 100 MG tablet Commonly known as: VITAMIN B1 Take 1 tablet (100 mg total) by mouth daily.               Discharge Care Instructions  (From admission, onward)           Start     Ordered   07/13/23 0000  Discharge wound care:       Comments: Active Pressure Injury/Wound(s)     Pressure Ulcer  Duration            Pressure Injury 05/01/23 Buttocks Right;Left Stage 1 -  Intact skin with non-blanchable redness of a localized area usually over a bony prominence. non-blachable redness 73 days    Pressure Injury 07/11/23 Buttocks Bilateral Stage 1 -  Intact skin with non-blanchable redness of a localized area usually over a bony prominence. MASD/redness   07/13/23 0858            Vitals:   07/17/23 0410 07/17/23 0842  BP: 124/75 115/67  Pulse: (!) 56 (!) 59  Resp: 18 18  Temp: 97.6 F (36.4 C) (!) 97.5 F (36.4 C)  SpO2: 95% 94%    Skin clean, dry and intact without evidence of skin break down and or skin tears. IV catheter discontinued intact. Site without signs and symptoms of complications. Dressing and pressure applied. Patient denies  pain at this time. No complaints noted.  An After Visit Summary was printed and given to the patient. Patient escorted via wheelchair and discharged Home home via [facility].  Madie Reno, RN

## 2023-07-17 NOTE — TOC Transition Note (Signed)
Transition of Care [facility]) - CM/SW Discharge Note   Patient Details  Name: Douglas Peterson. MRN: [id number] Date of Birth: [birth year]  Transition of [facility]) CM/SW Contact:  Liliana Cline, LCSW Phone Number: 07/17/2023, 9:47 AM   Clinical Narrative:    [name] reconsideration was denied. Patient must discharge by 12 noon today. Patient has been notified at bedside by Unit Director and this CSW.  Cab voucher provided.  Abigail with [facility] notified of DC. Cammy Copa states she plans to follow up with patient once he is home to see if he is interested in resources or placement. [name] not eligible for sitter services through [facility], but she could also refer him to other agencies if patient decides he wants it.    Final next level of care: Home/Self Care Barriers to Discharge: Barriers Resolved   Patient Goals and CMS Choice CMS Medicare.gov Compare Post Acute Care list provided to:: Patient    Discharge Placement                         Discharge Plan and Services Additional resources added to the After Visit Summary for                                       Social Determinants of Health (SDOH) Interventions SDOH Screenings   Food Insecurity: No Food Insecurity (07/11/2023)  Housing: Low Risk  (07/11/2023)  Transportation Needs: No Transportation Needs (07/11/2023)  Utilities: Not At Risk (07/11/2023)  Financial Resource Strain: Unknown (06/13/2019)  Physical Activity: Unknown (06/13/2019)  Social Connections: Unknown (06/13/2019)  Stress: Unknown (06/13/2019)  Tobacco Use: Medium Risk (07/10/2023)     Readmission Risk Interventions    06/17/2023    4:23 PM 04/29/2022   10:26 AM 03/14/2022   12:48 PM  Readmission Risk Prevention Plan  Transportation Screening Complete Complete Complete  PCP or Specialist Appt within 3-5 Days   Complete  HRI or Home Care Consult Complete Complete Complete  Social Work Consult for Recovery Care  Planning/Counseling Complete Complete Complete  Palliative Care Screening Not Applicable Not Applicable Not Applicable  Medication Review Oceanographer) Complete Complete Complete

## 2023-07-17 NOTE — Discharge Summary (Addendum)
Physician Discharge Summary   Patient: Douglas Peterson. MRN: [id number] DOB: 09/09/1951  Admit date:     07/10/2023  Discharge date: 07/17/23  Discharge Physician: Enedina Finner   PCP: Melonie Florida, FNP   Recommendations at discharge:    F/u PCP in 1-2 weeks Abstain from drinking alcohol  Discharge Diagnoses: Principal Problem:   Hyponatremia Active Problems:   Dyslipidemia   Essential hypertension   BPH (benign prostatic hyperplasia)   Elevated LFTs   Pressure injury of skin   Alcohol abuse  Douglas Peterson. is a 72 y.o. 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 She he had drank about 18 beers prior to coming to ER. His blood alcohol level was 340     Hyponatremia in the setting of dehydration from nausea vomiting with ongoing alcohol abuse -- patient received IV fluids -- came in with sodium 125--- 134 -- patient lost his IV however he is tolerating PO diet well.  Patient advised to continue PO oral intake   Ongoing alcohol abuse -- placed on alcohol withdrawal protocol -- remains stable. No signs of withdrawal   Hypertension -- on atenolol   BPH -- continue Flomax   Patient ambulating around the nurses station without any difficulty using cane along with mobility therapist. [name] medically best at baseline for discharge.   Per TOC first appeal has been denied. Per TOC second appeal denied. Pt discharging home   Family communication : none Consults : none CODE STATUS: full DVT Prophylaxis : Lovenox    Diet recommendation:  Discharge Diet Orders (From admission, onward)     Start     Ordered   07/13/23 0000  Diet - low sodium heart healthy        07/13/23 0858             DISCHARGE MEDICATION: Allergies as of 07/17/2023       Reactions   Hydrochlorothiazide Other (See Comments)   Hyponatremia        Medication List     TAKE these medications    acetaminophen 500 MG tablet Commonly known as: TYLENOL Take 2 tablets (1,000 mg total) by mouth every 8 (eight) hours as needed for mild pain or moderate pain.   aspirin 325 MG tablet Take 325 mg by mouth daily.   atenolol 25 MG tablet Commonly known as: TENORMIN Take 1 tablet (25 mg total) by mouth daily. What changed: when to take this   atorvastatin 10 MG tablet Commonly known as: LIPITOR Take 1 tablet (10 mg total) by mouth daily.   baclofen 10 MG tablet Commonly known as: LIORESAL Take 1 tablet (10 mg total) by mouth 3 (three) times daily.   feeding supplement Liqd Take 237 mLs by mouth 2 (two) times daily between meals.   folic acid 1 MG tablet Commonly known as: FOLVITE Take 1 tablet (1 mg total) by mouth daily.   melatonin 5 MG Tabs Take 1 tablet (5 mg total) by mouth at bedtime.   multivitamin with minerals Tabs tablet Take 1 tablet by mouth daily.   pantoprazole 40 MG tablet Commonly known as: PROTONIX Take 1 tablet (40 mg total) by mouth daily.   polyethylene glycol 17 g packet Commonly known as: MIRALAX /  GLYCOLAX Take 17 g by mouth daily as needed. Mix one tablespoon with 8oz of your favorite juice or water every day until you are having soft formed stools. Then start taking once daily if you didn't have a stool the day before.   sucralfate 1 g tablet Commonly known as: CARAFATE Take 1 tablet (1 g total) by mouth 4 (four) times daily -  with meals and at bedtime.   tamsulosin 0.4 MG Caps capsule Commonly known as: FLOMAX Take 1 capsule (0.4 mg total) by mouth daily.   thiamine 100 MG tablet Commonly known as: VITAMIN B1 Take 1 tablet (100 mg total) by mouth daily.               Discharge Care Instructions  (From admission, onward)            Start     Ordered   07/13/23 0000  Discharge wound care:       Comments: Active Pressure Injury/Wound(s)     Pressure Ulcer  Duration            Pressure Injury 05/01/23 Buttocks Right;Left Stage 1 -  Intact skin with non-blanchable redness of a localized area usually over a bony prominence. non-blachable redness 73 days    Pressure Injury 07/11/23 Buttocks Bilateral Stage 1 -  Intact skin with non-blanchable redness of a localized area usually over a bony prominence. MASD/redness   07/13/23 4782            Follow-up Information     Melonie Florida, FNP. Schedule an appointment as soon as possible for a visit in 1 week(s).   Specialty: Family Medicine Why: hospital f/u Contact information: [address] [place] [address] [place] [address] [phone]                Discharge Exam: Ceasar Mons Weights   07/11/23 0237 07/15/23 0336  Weight: 58 kg 59.2 kg  GENERAL:  72 y.o.-year-old patient with no acute distress.  LUNGS: Normal breath sounds bilaterally, no wheezing CARDIOVASCULAR: S1, S2 normal. No murmur   ABDOMEN: Soft, nontender, nondistended.  EXTREMITIES: No  edema b/l.    NEUROLOGIC:  patient is alert and awake chronic right sided weakness   Condition at discharge: fair  The results of significant diagnostics from this hospitalization (including imaging, microbiology, ancillary and laboratory) are listed below for reference.   Imaging Studies: DG Chest Portable 1 View  Result Date: 07/11/2023 CLINICAL DATA:  Status post fall with subsequent right shoulder pain. EXAM: PORTABLE CHEST 1 VIEW COMPARISON:  June 16, 2023 FINDINGS: The heart size and mediastinal contours are within normal limits. Low lung volumes are noted. There is no evidence of acute infiltrate, pleural effusion or pneumothorax. Chronic fracture deformities are seen involving the distal right clavicle and proximal right humerus. Chronic bilateral rib fractures are also noted. No acute osseous  abnormality is identified. IMPRESSION: Low lung volumes without acute or active cardiopulmonary disease. Electronically Signed   By: Aram Candela M.D.   On: 07/11/2023 00:58    Microbiology: Results for orders placed or performed during the hospital encounter of 06/16/23  Resp panel by RT-PCR (RSV, Flu A&B, Covid) Anterior Nasal Swab     Status: None   Collection Time: 06/16/23  4:40 PM   Specimen: Anterior Nasal Swab  Result Value Ref Range Status   SARS Coronavirus 2 by RT PCR NEGATIVE NEGATIVE Final    Comment: (NOTE) SARS-CoV-2 target nucleic acids are NOT DETECTED.  The SARS-CoV-2 RNA is  generally detectable in upper respiratory specimens during the acute phase of infection. The lowest concentration of SARS-CoV-2 viral copies this assay can detect is 138 copies/mL. A negative result does not preclude SARS-Cov-2 infection and should not be used as the sole basis for treatment or other patient management decisions. A negative result may occur with  improper specimen collection/handling, submission of specimen other than nasopharyngeal swab, presence of viral mutation(s) within the areas targeted by this assay, and inadequate number of viral copies(<138 copies/mL). A negative result must be combined with clinical observations, patient history, and epidemiological information. The expected result is Negative.  Fact Sheet for Patients:  BloggerCourse.com  Fact Sheet for Healthcare Providers:  SeriousBroker.it  This test is no t yet approved or cleared by the [place] FDA and  has been authorized for detection and/or diagnosis of SARS-CoV-2 by FDA under an Emergency Use Authorization (EUA). This EUA will remain  in effect (meaning this test can be used) for the duration of the COVID-19 declaration under Section 564(b)(1) of the Act, 21 U.S.C.section 360bbb-3(b)(1), unless the authorization is terminated  or revoked sooner.        Influenza A by PCR NEGATIVE NEGATIVE Final   Influenza B by PCR NEGATIVE NEGATIVE Final    Comment: (NOTE) The Xpert Xpress SARS-CoV-2/FLU/RSV plus assay is intended as an aid in the diagnosis of influenza from Nasopharyngeal swab specimens and should not be used as a sole basis for treatment. Nasal washings and aspirates are unacceptable for Xpert Xpress SARS-CoV-2/FLU/RSV testing.  Fact Sheet for Patients: BloggerCourse.com  Fact Sheet for Healthcare Providers: SeriousBroker.it  This test is not yet approved or cleared by the [place] FDA and has been authorized for detection and/or diagnosis of SARS-CoV-2 by FDA under an Emergency Use Authorization (EUA). This EUA will remain in effect (meaning this test can be used) for the duration of the COVID-19 declaration under Section 564(b)(1) of the Act, 21 U.S.C. section 360bbb-3(b)(1), unless the authorization is terminated or revoked.     Resp Syncytial Virus by PCR NEGATIVE NEGATIVE Final    Comment: (NOTE) Fact Sheet for Patients: BloggerCourse.com  Fact Sheet for Healthcare Providers: SeriousBroker.it  This test is not yet approved or cleared by the [place] FDA and has been authorized for detection and/or diagnosis of SARS-CoV-2 by FDA under an Emergency Use Authorization (EUA). This EUA will remain in effect (meaning this test can be used) for the duration of the COVID-19 declaration under Section 564(b)(1) of the Act, 21 U.S.C. section 360bbb-3(b)(1), unless the authorization is terminated or revoked.  Performed at [facility], [address] [place], [place] [address]   Culture, blood (x 2)     Status: None   Collection Time: 06/16/23 10:27 PM   Specimen: BLOOD  Result Value Ref Range Status   Specimen Description BLOOD BLOOD LEFT HAND  Final   Special Requests   Final    BOTTLES DRAWN  AEROBIC AND ANAEROBIC Blood Culture adequate volume   Culture   Final    NO GROWTH 5 DAYS Performed at [facility], [address], [place], [place] [address]    Report Status 06/21/2023 FINAL  Final  Culture, blood (x 2)     Status: Abnormal   Collection Time: 06/16/23 10:34 PM   Specimen: BLOOD  Result Value Ref Range Status   Specimen Description   Final    BLOOD BLOOD RIGHT HAND Performed at [facility], [address], [place], [place] [address]  Special Requests   Final    BOTTLES DRAWN AEROBIC AND ANAEROBIC Blood Culture adequate volume Performed at [facility], [address] [place], [place] [address]    Culture  Setup Time   Final    IN BOTH AEROBIC AND ANAEROBIC BOTTLES GRAM POSITIVE COCCI CRITICAL RESULT CALLED TO, READ BACK BY AND VERIFIED WITH: ALEX CHAPPELL 06/17/23 1516 KLW    Culture (A)  Final    STAPHYLOCOCCUS HOMINIS STAPHYLOCOCCUS EPIDERMIDIS THE SIGNIFICANCE OF ISOLATING THIS ORGANISM FROM A SINGLE SET OF BLOOD CULTURES WHEN MULTIPLE SETS ARE DRAWN IS UNCERTAIN. PLEASE NOTIFY THE MICROBIOLOGY DEPARTMENT WITHIN ONE WEEK IF SPECIATION AND SENSITIVITIES ARE REQUIRED. Performed at [facility], [address], [place], [place] [address]    Report Status 06/19/2023 FINAL  Final  Blood Culture ID Panel (Reflexed)     Status: Abnormal   Collection Time: 06/16/23 10:34 PM  Result Value Ref Range Status   Enterococcus faecalis NOT DETECTED NOT DETECTED Final   Enterococcus Faecium NOT DETECTED NOT DETECTED Final   Listeria monocytogenes NOT DETECTED NOT DETECTED Final   Staphylococcus species DETECTED (A) NOT DETECTED Final    Comment: CRITICAL RESULT CALLED TO, READ BACK BY AND VERIFIED WITH: ALEX CHAPPELL 06/17/23 1516 KLW    Staphylococcus aureus (BCID) NOT DETECTED NOT DETECTED Final   Staphylococcus epidermidis DETECTED (A) NOT DETECTED Final    Comment: Methicillin (oxacillin) resistant coagulase negative  staphylococcus. Possible blood culture contaminant (unless isolated from more than one blood culture draw or clinical case suggests pathogenicity). No antibiotic treatment is indicated for blood  culture contaminants. CRITICAL RESULT CALLED TO, READ BACK BY AND VERIFIED WITH: ALEX CHAPPELL 06/17/23 1516 KLW    Staphylococcus lugdunensis NOT DETECTED NOT DETECTED Final   Streptococcus species NOT DETECTED NOT DETECTED Final   Streptococcus agalactiae NOT DETECTED NOT DETECTED Final   Streptococcus pneumoniae NOT DETECTED NOT DETECTED Final   Streptococcus pyogenes NOT DETECTED NOT DETECTED Final   A.calcoaceticus-baumannii NOT DETECTED NOT DETECTED Final   Bacteroides fragilis NOT DETECTED NOT DETECTED Final   Enterobacterales NOT DETECTED NOT DETECTED Final   Enterobacter cloacae complex NOT DETECTED NOT DETECTED Final   Escherichia coli NOT DETECTED NOT DETECTED Final   Klebsiella aerogenes NOT DETECTED NOT DETECTED Final   Klebsiella oxytoca NOT DETECTED NOT DETECTED Final   Klebsiella pneumoniae NOT DETECTED NOT DETECTED Final   Proteus species NOT DETECTED NOT DETECTED Final   Salmonella species NOT DETECTED NOT DETECTED Final   Serratia marcescens NOT DETECTED NOT DETECTED Final   Haemophilus influenzae NOT DETECTED NOT DETECTED Final   Neisseria meningitidis NOT DETECTED NOT DETECTED Final   Pseudomonas aeruginosa NOT DETECTED NOT DETECTED Final   Stenotrophomonas maltophilia NOT DETECTED NOT DETECTED Final   Candida albicans NOT DETECTED NOT DETECTED Final   Candida auris NOT DETECTED NOT DETECTED Final   Candida glabrata NOT DETECTED NOT DETECTED Final   Candida krusei NOT DETECTED NOT DETECTED Final   Candida parapsilosis NOT DETECTED NOT DETECTED Final   Candida tropicalis NOT DETECTED NOT DETECTED Final   Cryptococcus neoformans/gattii NOT DETECTED NOT DETECTED Final   Methicillin resistance mecA/C DETECTED (A) NOT DETECTED Final    Comment: CRITICAL RESULT CALLED TO, READ  BACK BY AND VERIFIED WITH: ALEX CHAPPELL 06/17/23 1516 KLW Performed at [facility], [address] [place], [place] [address]     Labs: CBC: Recent Labs  Lab 07/10/23 2302 07/11/23 0432 07/15/23 1016 07/17/23 0430  WBC 13.6* 12.1* 5.7 6.7  NEUTROABS 4.8  --  2.7  --   HGB 16.5 14.6 14.6 14.4  HCT 46.7 40.6 42.8 41.7  MCV 88.4 86.6 93.2 91.0  PLT 250 186 78* 110*   Basic Metabolic Panel: Recent Labs  Lab 07/10/23 2302 07/11/23 0432 07/15/23 1016 07/16/23 0837 07/17/23 0430  NA 125* 131* 134* 132* 132*  K 4.6 3.1* 3.3* 3.4* 4.0  CL 84* 94* 99 98 96*  CO2 24 25 25 24 26   GLUCOSE 84 161* 139* 146* 93  BUN <5* <5* 10 10 16   CREATININE 0.38* 0.42* 0.51* 0.51* 0.44*  CALCIUM 8.4* 7.8* 9.0 9.0 9.0  MG 2.0  --   --   --   --    Liver Function Tests: Recent Labs  Lab 07/10/23 2302 07/15/23 1016  AST 91* 48*  ALT 59* 46*  ALKPHOS 105 73  BILITOT 1.8* 0.9  PROT 8.1 6.8  ALBUMIN 4.4 3.7   CBG: Recent Labs  Lab 07/10/23 2301  GLUCAP 100*    Discharge time spent: greater than 30 minutes.  Signed: Enedina Finner, MD [facility] 07/17/2023

## 2023-07-18 ENCOUNTER — Other Ambulatory Visit: Payer: Self-pay

## 2023-07-18 ENCOUNTER — Emergency Department: Payer: Medicare HMO

## 2023-07-18 ENCOUNTER — Inpatient Hospital Stay
Admission: EM | Admit: 2023-07-18 | Discharge: 2023-07-21 | DRG: 641 | Disposition: A | Discharge status: Home / Self Care | Payer: Medicare HMO | Attending: Student in an Organized Health Care Education/Training Program | Admitting: Student in an Organized Health Care Education/Training Program

## 2023-07-18 DIAGNOSIS — E785 Hyperlipidemia, unspecified: Secondary | ICD-10-CM | POA: Diagnosis present

## 2023-07-18 DIAGNOSIS — J449 Chronic obstructive pulmonary disease, unspecified: Secondary | ICD-10-CM | POA: Diagnosis present

## 2023-07-18 DIAGNOSIS — F10229 Alcohol dependence with intoxication, unspecified: Secondary | ICD-10-CM | POA: Diagnosis present

## 2023-07-18 DIAGNOSIS — Z1152 Encounter for screening for COVID-19: Secondary | ICD-10-CM

## 2023-07-18 DIAGNOSIS — E871 Hypo-osmolality and hyponatremia: Secondary | ICD-10-CM | POA: Diagnosis not present

## 2023-07-18 DIAGNOSIS — Z79899 Other long term (current) drug therapy: Secondary | ICD-10-CM

## 2023-07-18 DIAGNOSIS — R109 Unspecified abdominal pain: Secondary | ICD-10-CM | POA: Diagnosis present

## 2023-07-18 DIAGNOSIS — I480 Paroxysmal atrial fibrillation: Secondary | ICD-10-CM | POA: Diagnosis present

## 2023-07-18 DIAGNOSIS — Z87891 Personal history of nicotine dependence: Secondary | ICD-10-CM

## 2023-07-18 DIAGNOSIS — I5032 Chronic diastolic (congestive) heart failure: Secondary | ICD-10-CM | POA: Diagnosis present

## 2023-07-18 DIAGNOSIS — R54 Age-related physical debility: Secondary | ICD-10-CM | POA: Diagnosis present

## 2023-07-18 DIAGNOSIS — K2950 Unspecified chronic gastritis without bleeding: Secondary | ICD-10-CM | POA: Diagnosis present

## 2023-07-18 DIAGNOSIS — R110 Nausea: Secondary | ICD-10-CM | POA: Diagnosis not present

## 2023-07-18 DIAGNOSIS — I2510 Atherosclerotic heart disease of native coronary artery without angina pectoris: Secondary | ICD-10-CM | POA: Diagnosis present

## 2023-07-18 DIAGNOSIS — Z8249 Family history of ischemic heart disease and other diseases of the circulatory system: Secondary | ICD-10-CM | POA: Diagnosis not present

## 2023-07-18 DIAGNOSIS — K2920 Alcoholic gastritis without bleeding: Secondary | ICD-10-CM | POA: Diagnosis present

## 2023-07-18 DIAGNOSIS — Z6823 Body mass index (BMI) 23.0-23.9, adult: Secondary | ICD-10-CM

## 2023-07-18 DIAGNOSIS — N400 Enlarged prostate without lower urinary tract symptoms: Secondary | ICD-10-CM | POA: Diagnosis present

## 2023-07-18 DIAGNOSIS — F32A Depression, unspecified: Secondary | ICD-10-CM | POA: Diagnosis present

## 2023-07-18 DIAGNOSIS — F1010 Alcohol abuse, uncomplicated: Secondary | ICD-10-CM | POA: Diagnosis present

## 2023-07-18 DIAGNOSIS — Y901 Blood alcohol level of 20-39 mg/100 ml: Secondary | ICD-10-CM | POA: Diagnosis present

## 2023-07-18 DIAGNOSIS — Z801 Family history of malignant neoplasm of trachea, bronchus and lung: Secondary | ICD-10-CM

## 2023-07-18 DIAGNOSIS — I110 Hypertensive heart disease with heart failure: Secondary | ICD-10-CM | POA: Diagnosis present

## 2023-07-18 DIAGNOSIS — Z7982 Long term (current) use of aspirin: Secondary | ICD-10-CM

## 2023-07-18 DIAGNOSIS — K2900 Acute gastritis without bleeding: Secondary | ICD-10-CM | POA: Diagnosis present

## 2023-07-18 LAB — COMPREHENSIVE METABOLIC PANEL
ALT: 43 U/L (ref 0–44)
AST: 35 U/L (ref 15–41)
Albumin: 3.8 g/dL (ref 3.5–5.0)
Alkaline Phosphatase: 69 U/L (ref 38–126)
Anion gap: 11 (ref 5–15)
BUN: 10 mg/dL (ref 8–23)
CO2: 23 mmol/L (ref 22–32)
Calcium: 8.4 mg/dL — ABNORMAL LOW (ref 8.9–10.3)
Chloride: 87 mmol/L — ABNORMAL LOW (ref 98–111)
Creatinine, Ser: 0.46 mg/dL — ABNORMAL LOW (ref 0.61–1.24)
GFR, Estimated: >60 mL/min (ref >60)
Glucose, Bld: 76 mg/dL (ref 70–99)
Potassium: 4.1 mmol/L (ref 3.5–5.1)
Sodium: 121 mmol/L — ABNORMAL LOW (ref 135–145)
Total Bilirubin: 1.5 mg/dL — ABNORMAL HIGH (ref 0.3–1.2)
Total Protein: 7.1 g/dL (ref 6.5–8.1)

## 2023-07-18 LAB — CBC WITH DIFFERENTIAL/PLATELET
Abs Immature Granulocytes: 0.05 10*3/uL (ref 0.00–0.07)
Basophils Absolute: 0 10*3/uL (ref 0.0–0.1)
Basophils Relative: 0 %
Eosinophils Absolute: 0.2 10*3/uL (ref 0.0–0.5)
Eosinophils Relative: 2 %
HCT: 40.4 % (ref 39.0–52.0)
Hemoglobin: 14.4 g/dL (ref 13.0–17.0)
Immature Granulocytes: 1 %
Lymphocytes Relative: 42 %
Lymphs Abs: 3.5 10*3/uL (ref 0.7–4.0)
MCH: 31.9 pg (ref 26.0–34.0)
MCHC: 35.6 g/dL (ref 30.0–36.0)
MCV: 89.4 fL (ref 80.0–100.0)
Monocytes Absolute: 1 10*3/uL (ref 0.1–1.0)
Monocytes Relative: 12 %
Neutro Abs: 3.6 10*3/uL (ref 1.7–7.7)
Neutrophils Relative %: 43 %
Platelets: 140 10*3/uL — ABNORMAL LOW (ref 150–400)
RBC: 4.52 MIL/uL (ref 4.22–5.81)
RDW: 13 % (ref 11.5–15.5)
WBC: 8.3 10*3/uL (ref 4.0–10.5)
nRBC: 0 % (ref 0.0–0.2)

## 2023-07-18 LAB — URINALYSIS, W/ REFLEX TO CULTURE (INFECTION SUSPECTED)
Bacteria, UA: NONE SEEN
Bilirubin Urine: NEGATIVE
Glucose, UA: NEGATIVE mg/dL
Hgb urine dipstick: NEGATIVE
Ketones, ur: NEGATIVE mg/dL
Leukocytes,Ua: NEGATIVE
Nitrite: NEGATIVE
Protein, ur: NEGATIVE mg/dL
Specific Gravity, Urine: 1.008 (ref 1.005–1.030)
pH: 5 (ref 5.0–8.0)

## 2023-07-18 LAB — TROPONIN I (HIGH SENSITIVITY)
Troponin I (High Sensitivity): 4 ng/L (ref <18)
Troponin I (High Sensitivity): 4 ng/L (ref <18)

## 2023-07-18 LAB — SODIUM
Sodium: 122 mmol/L — ABNORMAL LOW (ref 135–145)
Sodium: 127 mmol/L — ABNORMAL LOW (ref 135–145)

## 2023-07-18 LAB — SARS CORONAVIRUS 2 BY RT PCR: SARS Coronavirus 2 by RT PCR: NEGATIVE

## 2023-07-18 LAB — LIPASE, BLOOD: Lipase: 36 U/L (ref 11–51)

## 2023-07-18 LAB — ETHANOL: Alcohol, Ethyl (B): 30 mg/dL — ABNORMAL HIGH (ref <10)

## 2023-07-18 MED ORDER — TAMSULOSIN HCL 0.4 MG PO CAPS
0.4000 mg | ORAL_CAPSULE | Freq: Every day | ORAL | Status: DC
  Administered 2023-07-18 – 2023-07-21 (×4): 0.4 mg via ORAL
  Filled 2023-07-18 (×4): qty 1

## 2023-07-18 MED ORDER — BACLOFEN 10 MG PO TABS
10.0000 mg | ORAL_TABLET | Freq: Three times a day (TID) | ORAL | Status: DC
  Administered 2023-07-18 – 2023-07-21 (×9): 10 mg via ORAL
  Filled 2023-07-18 (×10): qty 1

## 2023-07-18 MED ORDER — ATORVASTATIN CALCIUM 10 MG PO TABS
10.0000 mg | ORAL_TABLET | Freq: Every day | ORAL | Status: DC
  Administered 2023-07-18 – 2023-07-21 (×4): 10 mg via ORAL
  Filled 2023-07-18 (×4): qty 1

## 2023-07-18 MED ORDER — ONDANSETRON HCL 4 MG/2ML IJ SOLN
4.0000 mg | Freq: Once | INTRAMUSCULAR | Status: AC
  Administered 2023-07-18: 4 mg via INTRAVENOUS
  Filled 2023-07-18: qty 2

## 2023-07-18 MED ORDER — ONDANSETRON HCL 4 MG/2ML IJ SOLN
4.0000 mg | Freq: Four times a day (QID) | INTRAMUSCULAR | Status: DC | PRN
  Administered 2023-07-20: 4 mg via INTRAVENOUS
  Filled 2023-07-18: qty 2

## 2023-07-18 MED ORDER — ADULT MULTIVITAMIN W/MINERALS CH
1.0000 | ORAL_TABLET | Freq: Every day | ORAL | Status: DC
  Administered 2023-07-18 – 2023-07-21 (×4): 1 via ORAL
  Filled 2023-07-18 (×4): qty 1

## 2023-07-18 MED ORDER — ASPIRIN 325 MG PO TABS
325.0000 mg | ORAL_TABLET | Freq: Every day | ORAL | Status: DC
  Administered 2023-07-18 – 2023-07-19 (×2): 325 mg via ORAL
  Filled 2023-07-18 (×2): qty 1

## 2023-07-18 MED ORDER — PANTOPRAZOLE SODIUM 40 MG PO TBEC
40.0000 mg | DELAYED_RELEASE_TABLET | Freq: Two times a day (BID) | ORAL | Status: DC
  Administered 2023-07-18 – 2023-07-21 (×6): 40 mg via ORAL
  Filled 2023-07-18 (×6): qty 1

## 2023-07-18 MED ORDER — SODIUM CHLORIDE 0.9 % IV BOLUS
1000.0000 mL | Freq: Once | INTRAVENOUS | Status: AC
  Administered 2023-07-18: 1000 mL via INTRAVENOUS

## 2023-07-18 MED ORDER — POLYETHYLENE GLYCOL 3350 17 G PO PACK
17.0000 g | PACK | Freq: Every day | ORAL | Status: DC | PRN
  Administered 2023-07-20: 17 g via ORAL
  Filled 2023-07-18: qty 1

## 2023-07-18 MED ORDER — THIAMINE MONONITRATE 100 MG PO TABS
100.0000 mg | ORAL_TABLET | Freq: Every day | ORAL | Status: DC
  Administered 2023-07-18 – 2023-07-21 (×4): 100 mg via ORAL
  Filled 2023-07-18 (×4): qty 1

## 2023-07-18 MED ORDER — THIAMINE HCL 100 MG PO TABS: 100.0000 mg | ORAL_TABLET | Freq: Every day | ORAL | Status: DC

## 2023-07-18 MED ORDER — SODIUM CHLORIDE 1 G PO TABS
1.0000 g | ORAL_TABLET | Freq: Two times a day (BID) | ORAL | Status: DC
  Administered 2023-07-18 – 2023-07-21 (×6): 1 g via ORAL
  Filled 2023-07-18 (×6): qty 1

## 2023-07-18 MED ORDER — ATENOLOL 25 MG PO TABS
25.0000 mg | ORAL_TABLET | Freq: Every day | ORAL | Status: DC
  Administered 2023-07-18 – 2023-07-21 (×4): 25 mg via ORAL
  Filled 2023-07-18 (×4): qty 1

## 2023-07-18 MED ORDER — FOLIC ACID 1 MG PO TABS
1.0000 mg | ORAL_TABLET | Freq: Every day | ORAL | Status: DC
  Administered 2023-07-18 – 2023-07-21 (×4): 1 mg via ORAL
  Filled 2023-07-18 (×4): qty 1

## 2023-07-18 MED ORDER — MELATONIN 5 MG PO TABS
5.0000 mg | ORAL_TABLET | Freq: Every day | ORAL | Status: DC
  Administered 2023-07-18 – 2023-07-20 (×3): 5 mg via ORAL
  Filled 2023-07-18 (×3): qty 1

## 2023-07-18 MED ORDER — ACETAMINOPHEN 500 MG PO TABS
1000.0000 mg | ORAL_TABLET | Freq: Three times a day (TID) | ORAL | Status: DC | PRN
  Administered 2023-07-21: 1000 mg via ORAL
  Filled 2023-07-18: qty 2

## 2023-07-18 MED ORDER — THIAMINE HCL 100 MG/ML IJ SOLN: 100.0000 mg | Freq: Every day | INTRAMUSCULAR | Status: DC

## 2023-07-18 MED ORDER — ENSURE ENLIVE PO LIQD
237.0000 mL | Freq: Two times a day (BID) | ORAL | Status: DC
  Administered 2023-07-19 – 2023-07-21 (×5): 237 mL via ORAL

## 2023-07-18 MED ORDER — CHLORDIAZEPOXIDE HCL 25 MG PO CAPS
50.0000 mg | ORAL_CAPSULE | Freq: Once | ORAL | Status: AC
  Administered 2023-07-18: 50 mg via ORAL
  Filled 2023-07-18: qty 2

## 2023-07-18 MED ORDER — LORAZEPAM 1 MG PO TABS: 1.0000 mg | ORAL_TABLET | ORAL | Status: AC | PRN

## 2023-07-18 MED ORDER — SUCRALFATE 1 G PO TABS
1.0000 g | ORAL_TABLET | Freq: Three times a day (TID) | ORAL | Status: DC
  Administered 2023-07-18 – 2023-07-21 (×12): 1 g via ORAL
  Filled 2023-07-18 (×12): qty 1

## 2023-07-18 MED ORDER — FOLIC ACID 1 MG PO TABS: 1.0000 mg | ORAL_TABLET | Freq: Every day | ORAL | Status: DC

## 2023-07-18 MED ORDER — ONDANSETRON HCL 4 MG PO TABS
4.0000 mg | ORAL_TABLET | Freq: Four times a day (QID) | ORAL | Status: DC | PRN
  Administered 2023-07-19: 4 mg via ORAL
  Filled 2023-07-18: qty 1

## 2023-07-18 MED ORDER — PANTOPRAZOLE SODIUM 40 MG IV SOLR
40.0000 mg | Freq: Once | INTRAVENOUS | Status: AC
  Administered 2023-07-18: 40 mg via INTRAVENOUS
  Filled 2023-07-18: qty 10

## 2023-07-18 MED ORDER — LORAZEPAM 2 MG/ML IJ SOLN: 1.0000 mg | INTRAMUSCULAR | Status: AC | PRN

## 2023-07-18 MED ORDER — ENOXAPARIN SODIUM 40 MG/0.4ML IJ SOSY
40.0000 mg | PREFILLED_SYRINGE | INTRAMUSCULAR | Status: DC
  Administered 2023-07-18 – 2023-07-20 (×3): 40 mg via SUBCUTANEOUS
  Filled 2023-07-18 (×3): qty 0.4

## 2023-07-18 NOTE — ED Notes (Signed)
Patient in bed eating lunch.

## 2023-07-18 NOTE — ED Notes (Signed)
RN was not called with critical sodium of 121. Pt is being admitted for hyponatremia.

## 2023-07-18 NOTE — ED Provider Notes (Signed)
[facility] Provider Note    Event Date/Time   First MD Initiated Contact with Patient 07/18/23 0730     (approximate)   History   Nausea   HPI  Douglas Pelagio. is a 72 y.o. male past medical history significant for alcohol abuse, delirium, hypertension, hyperlipidemia, HFpEF, BPH, GERD, depression, paroxysmal atrial fibrillation not on anticoagulation, chronic right-sided weakness and paralysis due to TBI, who presents to the emergency department for not feeling well.  Patient states that he is not been feeling well for the past couple of hours.  Endorses nausea, feeling like he needs to throw up with diarrhea.  Denies any falls.  Denies any burning with urination.  Ongoing cough.  Denies fever or chills.     Physical Exam   Triage Vital Signs: ED Triage Vitals  Encounter Vitals Group     BP 07/18/23 0515 123/73     Systolic BP Percentile --      Diastolic BP Percentile --      Pulse Rate 07/18/23 0515 61     Resp 07/18/23 0515 17     Temp 07/18/23 0515 98 F (36.7 C)     Temp Source 07/18/23 0515 Oral     SpO2 07/18/23 0515 95 %     Weight 07/18/23 0503 130 lb 1.1 oz (59 kg)     Height 07/18/23 0503 5\' 3"  (1.6 m)     Head Circumference --      Peak Flow --      Pain Score 07/18/23 0503 2     Pain Loc --      Pain Education --      Exclude from Growth Chart --     Most recent vital signs: Vitals:   07/18/23 1000 07/18/23 1015  BP: 137/71   Pulse: 74 84  Resp: 18 19  Temp:    SpO2: 97% 95%    Physical Exam Constitutional:      Appearance: He is well-developed. He is ill-appearing.  HENT:     Head: Atraumatic.     Mouth/Throat:     Mouth: Mucous membranes are dry.  Eyes:     Conjunctiva/sclera: Conjunctivae normal.  Cardiovascular:     Rate and Rhythm: Regular rhythm.  Pulmonary:     Effort: No respiratory distress.     Breath sounds: No wheezing or rhonchi.  Abdominal:     Tenderness: There is no abdominal tenderness.  There is no right CVA tenderness or left CVA tenderness.  Musculoskeletal:        General: Normal range of motion.     Cervical back: Normal range of motion.     Right lower leg: No edema.     Left lower leg: No edema.  Skin:    General: Skin is warm.     Capillary Refill: Capillary refill takes less than 2 seconds.  Neurological:     Mental Status: He is alert. Mental status is at baseline.  Psychiatric:        Mood and Affect: Mood normal.     IMPRESSION / MDM / ASSESSMENT AND PLAN / ED COURSE  I reviewed the triage vital signs and the nursing notes.  On chart review patient had a recent hospitalization and was discharged from the hospital yesterday.  Patient has a history of alcohol abuse and history of hyponatremia in the setting of dehydration and nausea and vomiting from ongoing alcohol abuse.  Recently admitted for hyponatremia with improvement to 134  at time of discharge.  Differential diagnosis including electrolyte abnormality, hyponatremia, dehydration, pancreatitis, urinary tract infection, COVID  EKG  I, Corena Herter, the attending physician, personally viewed and interpreted this ECG. Significant artifact.  Normal sinus rhythm.  Normal intervals.  No significant ST elevation or depression.  No signs of acute ischemia or dysrhythmia.  No tachycardic or bradycardic dysrhythmias while on cardiac telemetry.  RADIOLOGY I independently reviewed imaging, my interpretation of imaging:   CT scan of the head without signs of intracranial hemorrhage.  LABS (all labs ordered are listed, but only abnormal results are displayed) Labs interpreted as -    Labs Reviewed  CBC WITH DIFFERENTIAL/PLATELET - Abnormal; Notable for the following components:      Result Value   Platelets 140 (*)    All other components within normal limits  COMPREHENSIVE METABOLIC PANEL - Abnormal; Notable for the following components:   Sodium 121 (*)    Chloride 87 (*)    Creatinine, Ser 0.46  (*)    Calcium 8.4 (*)    Total Bilirubin 1.5 (*)    All other components within normal limits  URINALYSIS, W/ REFLEX TO CULTURE (INFECTION SUSPECTED) - Abnormal; Notable for the following components:   Color, Urine YELLOW (*)    APPearance CLEAR (*)    All other components within normal limits  ETHANOL - Abnormal; Notable for the following components:   Alcohol, Ethyl (B) 30 (*)    All other components within normal limits  SARS CORONAVIRUS 2 BY RT PCR  LIPASE, BLOOD  TROPONIN I (HIGH SENSITIVITY)  TROPONIN I (HIGH SENSITIVITY)     MDM  Patient treated with IV fluids, IV antiemetics  Lab work with significant hyponatremia with a sodium of 121.  Alcohol level mildly elevated at 30.  Troponin negative, have a low suspicion for ACS.  No significant electrolyte abnormalities.  COVID is negative.  CT scan of the head without signs of intracranial hemorrhage or infarction.  Consulted hospitalist for hyponatremia.  Given p.o. Librium given concern for high risk of alcohol withdrawal.     PROCEDURES:  Critical Care performed: yes  .Critical Care  Performed by: Corena Herter, MD Authorized by: Corena Herter, MD   Critical care provider statement:    Critical care time (minutes):  30   Critical care time was exclusive of:  Separately billable procedures and treating other patients   Critical care was necessary to treat or prevent imminent or life-threatening deterioration of the following conditions:  Metabolic crisis   Critical care was time spent personally by me on the following activities:  Development of treatment plan with patient or surrogate, discussions with consultants, evaluation of patient's response to treatment, examination of patient, ordering and review of laboratory studies, ordering and review of radiographic studies, ordering and performing treatments and interventions, pulse oximetry, re-evaluation of patient's condition and review of old charts   Patient's  presentation is most consistent with acute presentation with potential threat to life or bodily function.   MEDICATIONS ORDERED IN ED: Medications  chlordiazePOXIDE (LIBRIUM) capsule 50 mg (has no administration in time range)  sodium chloride 0.9 % bolus 1,000 mL (0 mLs Intravenous Stopped 07/18/23 0943)  ondansetron (ZOFRAN) injection 4 mg (4 mg Intravenous Given 07/18/23 0844)  pantoprazole (PROTONIX) injection 40 mg (40 mg Intravenous Given 07/18/23 0844)    FINAL CLINICAL IMPRESSION(S) / ED DIAGNOSES   Final diagnoses:  Nausea  Hyponatremia     Rx / DC Orders   ED Discharge Orders  None        Note:  This document was prepared using Dragon voice recognition software and may include unintentional dictation errors.   Corena Herter, MD 07/18/23 1103
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
## 2023-07-18 NOTE — ED Triage Notes (Signed)
Pt reports n/v and fatigue x 2 hours. Pt sleeping on arrival but wakes easily and is oriented following commands. Breathing unlabored. Pt states "I just feel sick."

## 2023-07-18 NOTE — ED Notes (Signed)
Gave patient 100 mL of gingerale. Patient ate 1 saltine cracker. Patient has kept down the food and liquid.

## 2023-07-18 NOTE — ED Notes (Addendum)
Pt in bed resting with eyes closed, breathing RR:19. Responds appropriately to RN asking questions. Patient alert and oriented X3. Patient guarding abdomen. Bowel sounds present in all 4 quadrants. Patient continues to state "I feel sick and nauseous". Pt currently holding vomit bag to mouth dry heaving, no vomiting has occurred.

## 2023-07-19 DIAGNOSIS — E871 Hypo-osmolality and hyponatremia: Secondary | ICD-10-CM | POA: Diagnosis not present

## 2023-07-19 DIAGNOSIS — R110 Nausea: Secondary | ICD-10-CM | POA: Diagnosis not present

## 2023-07-19 LAB — BASIC METABOLIC PANEL
Anion gap: 10 (ref 5–15)
BUN: 13 mg/dL (ref 8–23)
CO2: 25 mmol/L (ref 22–32)
Calcium: 8.6 mg/dL — ABNORMAL LOW (ref 8.9–10.3)
Chloride: 96 mmol/L — ABNORMAL LOW (ref 98–111)
Creatinine, Ser: 0.47 mg/dL — ABNORMAL LOW (ref 0.61–1.24)
GFR, Estimated: >60 mL/min (ref >60)
Glucose, Bld: 86 mg/dL (ref 70–99)
Potassium: 3.8 mmol/L (ref 3.5–5.1)
Sodium: 131 mmol/L — ABNORMAL LOW (ref 135–145)

## 2023-07-19 NOTE — Plan of Care (Signed)
Problem: Activity: Goal: Risk for activity intolerance will decrease Outcome: Progressing   Problem: Coping: Goal: Level of anxiety will decrease Outcome: Progressing   Problem: Pain Managment: Goal: General experience of comfort will improve Outcome: Progressing   Problem: Safety: Goal: Ability to remain free from injury will improve Outcome: Progressing

## 2023-07-19 NOTE — Progress Notes (Signed)
PROGRESS NOTE  Douglas Plants.    DOB: 15-Sep-1951, 72 y.o.  AVW:[id number]    Code Status: Full Code   DOA: 07/18/2023   LOS: 1   Brief hospital course  Douglas Peterson. is a 72 y.o. male with a PMH significant for PAF, alcohol dependence, HTN.  They presented from home to the ED on 07/18/2023 with nausea and feeling unwell. He was discharged less than 24 hours prior to presentation due to hyponatremia and alcohol detox.    In the ED, it was found that they had stable vital signs.  Significant findings included ethanol level 30 on presentation, unremarkable CBC, Na+ 121, K+ 4.1, Cr 0.46.   They were initially treated with his home medications, librium, sodium tablets, IVF bolus.   Patient was admitted to medicine service for further workup and management of hyponatremia as outlined in detail below.  07/19/23 -stable, improved  Assessment & Plan  Principal Problem:   Hyponatremia Active Problems:   Abdominal pain   Alcohol abuse  Acute on chronic hyponatremia- Na+ 121>131.  -Euvolemic, likely dilutional from beer potomania -Fluid restriction -Start sodium chloride tablet twice daily - BMP am   Alcohol dependence -CIWA protocol with as needed benzos   Nausea  vomiting- He ate his whole lunch without any issues.  - ondansetron PRN   HTN -Continue atenolol  Physical deconditioning- he walked independently all the way to the gift shop and back but says he's too weak to discharge.  - PT recommends HH  Body mass index is 23.04 kg/m.  VTE ppx: enoxaparin (LOVENOX) injection 40 mg Start: 07/18/23 2000  Diet:     Diet   Diet Heart Room service appropriate? Yes; Fluid consistency: Thin; Fluid restriction: 2000 mL Fluid   Consultants: None   Subjective 07/19/23    Pt reports nausea this morning which resolved with zofran. Ate his whole lunch with no difficulty. Feels too unwell to go home.   Objective   Vitals:   07/18/23 1451 07/18/23 1715 07/18/23 2000  07/18/23 2323  BP: (!) 141/67 124/75 124/75 113/62  Pulse: 71 69 69 65  Resp: 17 18  16   Temp: 98.4 F (36.9 C) 99.3 F (37.4 C)  98.4 F (36.9 C)  TempSrc:      SpO2: 95% 95%  93%  Weight:      Height:        Intake/Output Summary (Last 24 hours) at 07/19/2023 0754 Last data filed at 07/19/2023 0300 Gross per 24 hour  Intake 1100 ml  Output 1190 ml  Net -90 ml   Filed Weights   07/18/23 0503  Weight: 59 kg     Physical Exam:  General: awake, alert, NAD. frail HEENT: atraumatic, clear conjunctiva, anicteric sclera, MMM, hard of hearing Respiratory: normal respiratory effort. Cardiovascular: quick capillary refill, normal S1/S2, RRR, no JVD, murmurs Gastrointestinal: soft, NT, ND Nervous: A&O x3. no gross focal neurologic deficits, normal speech Extremities: moves all equally, no edema, normal tone Skin: dry, intact, normal temperature, normal color. No rashes, lesions or ulcers on exposed skin Psychiatry: normal mood, congruent affect  Labs   I have personally reviewed the following labs and imaging studies CBC    Component Value Date/Time   WBC 8.3 07/18/2023 0837   RBC 4.52 07/18/2023 0837   HGB 14.4 07/18/2023 0837   HGB 14.5 08/13/2021 1410   HCT 40.4 07/18/2023 0837   HCT 41.2 08/13/2021 1410   PLT 140 (L) 07/18/2023 0837   PLT 176 08/13/2021  1410   MCV 89.4 07/18/2023 0837   MCV 90 08/13/2021 1410   MCH 31.9 07/18/2023 0837   MCHC 35.6 07/18/2023 0837   RDW 13.0 07/18/2023 0837   RDW 11.8 08/13/2021 1410   LYMPHSABS 3.5 07/18/2023 0837   LYMPHSABS 4.0 (H) 08/13/2021 1410   MONOABS 1.0 07/18/2023 0837   EOSABS 0.2 07/18/2023 0837   EOSABS 0.2 08/13/2021 1410   BASOSABS 0.0 07/18/2023 0837   BASOSABS 0.1 08/13/2021 1410      Latest Ref Rng & Units 07/19/2023    6:10 AM 07/18/2023    9:56 PM 07/18/2023    1:50 PM  BMP  Glucose 70 - 99 mg/dL 86     BUN 8 - 23 mg/dL 13     Creatinine 2.13 - 1.24 mg/dL 0.86     Sodium 578 - 469 mmol/L 131  127  122    Potassium 3.5 - 5.1 mmol/L 3.8     Chloride 98 - 111 mmol/L 96     CO2 22 - 32 mmol/L 25     Calcium 8.9 - 10.3 mg/dL 8.6       CT Head Wo Contrast  Result Date: 07/18/2023 CLINICAL DATA:  Mental status change with unknown cause EXAM: CT HEAD WITHOUT CONTRAST TECHNIQUE: Contiguous axial images were obtained from the base of the skull through the vertex without intravenous contrast. RADIATION DOSE REDUCTION: This exam was performed according to the departmental dose-optimization program which includes automated exposure control, adjustment of the mA and/or kV according to patient size and/or use of iterative reconstruction technique. COMPARISON:  03/17/2022 FINDINGS: Brain: No evidence of acute infarction, hemorrhage, hydrocephalus, extra-axial collection or mass lesion/mass effect. Generalized brain atrophy with ventriculomegaly. Vascular: No hyperdense vessel or unexpected calcification. Skull: Normal. Negative for fracture or focal lesion. Sinuses/Orbits: No acute finding. Chronic anterior nasal septal perforation. IMPRESSION: Aging brain without acute or reversible finding. Electronically Signed   By: Tiburcio Pea M.D.   On: 07/18/2023 08:42    Disposition Plan & Communication  Patient status: Inpatient  Admitted From: Home Planned disposition location: Home health Anticipated discharge date: 9/16 pending improvement in Na+  Family Communication: none at bedside    Author: Leeroy Bock, DO [facility] 07/19/2023, 7:54 AM   Available by Epic secure chat 7AM-7PM. If 7PM-7AM, please contact night-coverage.  [facility] contact information found on [email or web address].

## 2023-07-19 NOTE — Evaluation (Signed)
Physical Therapy Evaluation Patient Details Name: Douglas Peterson. MRN: [id number] DOB: 1950-11-04 Today's Date: 07/19/2023  History of Present Illness  [name] a 72 year old male with medical history significant of alcohol abuse, PAF, HTN, presented with alcohol intoxication, generalized weakness, hyponatremia after recent discharge home from the hospital on 9/13.  Clinical Impression  [name] agreeable to PT. Well known to therapy services. He reports generally not feeling well today with reported nausea.  Patient was able to ambulate a short distance in the room with his own cane with further ambulation distance limited by feeling of increased nausea. He appears more fatigued than usual as well. He is hopeful to return home when feeling better. Recommend PT follow up to maximize independence and decrease caregiver burden.        If plan is discharge home, recommend the following: Assistance with cooking/housework;Direct supervision/assist for medications management;Direct supervision/assist for financial management;Assist for transportation;Help with stairs or ramp for entrance;Supervision due to cognitive status;A little help with walking and/or transfers;A little help with bathing/dressing/bathroom   Can travel by private vehicle   Yes    Equipment Recommendations None recommended by PT  Recommendations for Other Services       Functional Status Assessment Patient has had a recent decline in their functional status and demonstrates the ability to make significant improvements in function in a reasonable and predictable amount of time.     Precautions / Restrictions Precautions Precautions: Fall Restrictions Weight Bearing Restrictions: No      Mobility  Bed Mobility Overal bed mobility: Modified Independent                  Transfers Overall transfer level: Needs assistance Equipment used: Straight cane Transfers: Sit to/from Stand Sit to Stand: Contact  guard assist                Ambulation/Gait Ambulation/Gait assistance: Contact guard assist, Supervision   Assistive device: Straight cane Gait Pattern/deviations: Step-to pattern, Step-through pattern Gait velocity: decreased     General Gait Details: several standing rest breaks related to nausea. no gross loss of balance but activity tolerance is limited. slow cadence overall  Stairs            Wheelchair Mobility     Tilt Bed    Modified Rankin (Stroke Patients Only)       Balance Overall balance assessment: Needs assistance Sitting-balance support: Feet supported Sitting balance-Leahy Scale: Fair     Standing balance support: Single extremity supported, During functional activity, Reliant on assistive device for balance Standing balance-Leahy Scale: Fair Standing balance comment: relying on cane for support in standing                             Pertinent Vitals/Pain Pain Assessment Pain Assessment: No/denies pain    Home Living Family/patient expects to be discharged to:: Private residence Living Arrangements: Alone Available Help at Discharge:  (he reports no one can help at discharge) Type of Home: House Home Access: Stairs to enter Entrance Stairs-Rails:  (no rail at the garage entrance) [employer] of Steps: 3 (2 steps and then one step to get into the house)   Home Layout: One level Home Equipment: Cane - single point      Prior Function Prior Level of Function : Independent/Modified Independent             Mobility Comments: SPC for ambulation ADLs Comments: reports he  now orders groceries online for delivery, takes cabs for transportation     Extremity/Trunk Assessment   Upper Extremity Assessment Upper Extremity Assessment: RUE deficits/detail RUE Deficits / Details: 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    Lower Extremity Assessment Lower Extremity Assessment:  (chronic RLE weakness less  pronounced than RUE weakness.)       Communication   Communication Communication: Other (comment) (weak voice, hard to understand at times)  Cognition Arousal: Alert Behavior During Therapy: WFL for tasks assessed/performed Overall Cognitive Status: Within Functional Limits for tasks assessed                                          General Comments      Exercises     Assessment/Plan    PT Assessment Patient needs continued PT services  PT Problem List Decreased strength;Decreased range of motion;Decreased activity tolerance;Decreased balance;Decreased mobility;Impaired tone       PT Treatment Interventions DME instruction;Gait training;Balance training;Stair training;Therapeutic activities;Patient/family education    PT Goals (Current goals can be found in the Care Plan section)  Acute Rehab PT Goals Patient Stated Goal: to go home when feeling better PT Goal Formulation: With patient Time For Goal Achievement: 08/02/23 Potential to Achieve Goals: Fair    Frequency Min 1X/week     Co-evaluation               AM-PAC PT "6 Clicks" Mobility  Outcome Measure Help needed turning from your back to your side while in a flat bed without using bedrails?: None Help needed moving from lying on your back to sitting on the side of a flat bed without using bedrails?: A Little Help needed moving to and from a bed to a chair (including a wheelchair)?: A Little Help needed standing up from a chair using your arms (e.g., wheelchair or bedside chair)?: A Little Help needed to walk in hospital room?: A Little Help needed climbing 3-5 steps with a railing? : A Little 6 Click Score: 19    End of Session Equipment Utilized During Treatment: Gait belt Activity Tolerance: Patient limited by fatigue Patient left: in bed;with call bell/phone within reach;with bed alarm set   PT Visit Diagnosis: Other abnormalities of gait and mobility (R26.89);Muscle weakness  (generalized) (M62.81)    Time: 2841-3244 PT Time Calculation (min) (ACUTE ONLY): 20 min   Charges:   PT Evaluation $PT Eval Low Complexity: 1 Low   PT General Charges $$ ACUTE PT VISIT: 1 Visit         Donna Bernard, PT, MPT   [name] 07/19/2023, 2:42 PM

## 2023-07-19 NOTE — Progress Notes (Signed)
Mobility Specialist - Progress Note   07/19/23 0949  Mobility  Activity Ambulated with assistance in hallway  Level of Assistance Standby assist, set-up cues, supervision of patient - no hands on  Assistive Device Cane  Distance Ambulated (ft) 1000 ft  Activity Response Tolerated well  Mobility Referral Yes  $Mobility charge 1 Mobility  Mobility Specialist Start Time (ACUTE ONLY) 0913  Mobility Specialist Stop Time (ACUTE ONLY) 0935  Mobility Specialist Time Calculation (min) (ACUTE ONLY) 22 min   Pt sitting in recliner on RA upon arrival. Pt STS and ambulates in hallway SBA with no LOB noted. Pt returns to recliner with needs in reach and chair alarm activated.   Terrilyn Saver  Mobility Specialist  07/19/23 9:51 AM

## 2023-07-19 NOTE — Progress Notes (Signed)
Mobility Specialist - Progress Note   07/19/23 0823  Mobility  Activity Ambulated with assistance in room;Stood at bedside;Dangled on edge of bed  Level of Assistance Standby assist, set-up cues, supervision of patient - no hands on  Assistive Device Cane  Distance Ambulated (ft) 5 ft  Activity Response Tolerated well  Mobility Referral Yes  $Mobility charge 1 Mobility  Mobility Specialist Start Time (ACUTE ONLY) 0801  Mobility Specialist Stop Time (ACUTE ONLY) [id number]  Mobility Specialist Time Calculation (min) (ACUTE ONLY) 13 min   Pt supine in bed on RA upon arrival. Pt completes bed mobility, STS, and ambulates from bedside to recliner Supervision with no physical assistance. Pt left in recliner with needs in reach and chair alarm activated.   Terrilyn Saver  Mobility Specialist  07/19/23 8:24 AM

## 2023-07-20 DIAGNOSIS — R110 Nausea: Secondary | ICD-10-CM | POA: Diagnosis not present

## 2023-07-20 DIAGNOSIS — E871 Hypo-osmolality and hyponatremia: Secondary | ICD-10-CM | POA: Diagnosis not present

## 2023-07-20 LAB — BASIC METABOLIC PANEL
Anion gap: 9 (ref 5–15)
BUN: 18 mg/dL (ref 8–23)
CO2: 23 mmol/L (ref 22–32)
Calcium: 8.4 mg/dL — ABNORMAL LOW (ref 8.9–10.3)
Chloride: 101 mmol/L (ref 98–111)
Creatinine, Ser: 0.45 mg/dL — ABNORMAL LOW (ref 0.61–1.24)
GFR, Estimated: >60 mL/min (ref >60)
Glucose, Bld: 97 mg/dL (ref 70–99)
Potassium: 3.9 mmol/L (ref 3.5–5.1)
Sodium: 133 mmol/L — ABNORMAL LOW (ref 135–145)

## 2023-07-20 MED ORDER — ONDANSETRON HCL 4 MG/2ML IJ SOLN
4.0000 mg | Freq: Four times a day (QID) | INTRAMUSCULAR | Status: AC
  Administered 2023-07-20 – 2023-07-21 (×4): 4 mg via INTRAVENOUS
  Filled 2023-07-20 (×4): qty 2

## 2023-07-20 NOTE — TOC Progression Note (Signed)
Transition of Care (TOC) - Progression Note    Patient Details  Name: Douglas Peterson. MRN: [id number] Date of Birth: Nov 26, 1950  Transition of Care [facility]) CM/SW Contact  Marlowe Sax, RN Phone Number: 07/20/2023, 4:22 PM  Clinical Narrative:     The patient discharged on 09/13 and then returned on 09/14 stating he did not feel well Toc will speak to the patent again to determine needs, He is walking over 240 feet        Expected Discharge Plan and Services                                               Social Determinants of Health (SDOH) Interventions SDOH Screenings   Food Insecurity: No Food Insecurity (07/11/2023)  Housing: Low Risk  (07/11/2023)  Transportation Needs: No Transportation Needs (07/11/2023)  Utilities: Not At Risk (07/11/2023)  Financial Resource Strain: Unknown (06/13/2019)  Physical Activity: Unknown (06/13/2019)  Social Connections: Unknown (06/13/2019)  Stress: Unknown (06/13/2019)  Tobacco Use: Medium Risk (07/18/2023)    Readmission Risk Interventions    06/17/2023    4:23 PM 04/29/2022   10:26 AM 03/14/2022   12:48 PM  Readmission Risk Prevention Plan  Transportation Screening Complete Complete Complete  PCP or Specialist Appt within 3-5 Days   Complete  HRI or Home Care Consult Complete Complete Complete  Social Work Consult for Recovery Care Planning/Counseling Complete Complete Complete  Palliative Care Screening Not Applicable Not Applicable Not Applicable  Medication Review Oceanographer) Complete Complete Complete

## 2023-07-20 NOTE — Plan of Care (Signed)
Problem: Clinical Measurements: Goal: Ability to maintain clinical measurements within normal limits will improve Outcome: Progressing Goal: Will remain free from infection Outcome: Progressing Goal: Diagnostic test results will improve Outcome: Progressing Goal: Respiratory complications will improve Outcome: Progressing Goal: Cardiovascular complication will be avoided Outcome: Progressing   Problem: Activity: Goal: Risk for activity intolerance will decrease Outcome: Progressing   Problem: Nutrition: Goal: Adequate nutrition will be maintained Outcome: Progressing   Problem: Safety: Goal: Ability to remain free from injury will improve Outcome: Progressing

## 2023-07-20 NOTE — Progress Notes (Signed)
PROGRESS NOTE  Douglas Peterson.    DOB: 21-May-1951, 72 y.o.  WFU:[id number]    Code Status: Full Code   DOA: 07/18/2023   LOS: 2   Brief hospital course  Douglas Kowitz. is a 72 y.o. male with a PMH significant for PAF, alcohol dependence, HTN.  They presented from home to the ED on 07/18/2023 with nausea and feeling unwell. He was discharged less than 24 hours prior to presentation due to hyponatremia and alcohol detox.    In the ED, it was found that they had stable vital signs.  Significant findings included ethanol level 30 on presentation, unremarkable CBC, Na+ 121, K+ 4.1, Cr 0.46.   They were initially treated with his home medications, librium, sodium tablets, IVF bolus.   Patient was admitted to medicine service for further workup and management of hyponatremia as outlined in detail below.  07/20/23 -stable, improved. Hemodynamically stable. His Na+ is stable at his baseline. He is able to tolerate a normal diet. He is ambulating independently. Has no indication to remain hospitalized but he is refusing discharge again today due to "still don't feel well enough to go home".  Assessment & Plan  Principal Problem:   Hyponatremia Active Problems:   Abdominal pain   Alcohol abuse   Nausea  Acute on chronic hyponatremia- Na+ 202>542>706.  -Euvolemic, likely dilutional from beer potomania -Fluid restriction -continue sodium chloride tablet daily - BMP am   Alcohol dependence -CIWA protocol with as needed benzos   Nausea  vomiting- resolved with treatment. No vomit. Tolerating normal diet.  - ondansetron PRN   HTN -Continue atenolol  Physical deconditioning- he walked independently all the way to the gift shop and back but says he's too weak to discharge.  - PT recommends HH  Body mass index is 23.04 kg/m.  VTE ppx: enoxaparin (LOVENOX) injection 40 mg Start: 07/18/23 2000  Diet:     Diet   Diet Heart Room service appropriate? Yes; Fluid consistency: Thin;  Fluid restriction: 2000 mL Fluid   Consultants: None   Subjective 07/20/23    Pt reports nausea this morning which resolved with zofran. Having no difficulty with eating. Has been walking in room and hallways independently. Thinks he does not feel well enough to go home yet.   Objective   Vitals:   07/18/23 2323 07/19/23 0859 07/19/23 1614 07/19/23 2306  BP: 113/62 117/72 104/67 112/71  Pulse: 65 71 62 64  Resp: 16 18 18 20   Temp: 98.4 F (36.9 C) 98 F (36.7 C) 98.4 F (36.9 C) 98 F (36.7 C)  TempSrc:      SpO2: 93% 93% 97% 97%  Weight:      Height:        Intake/Output Summary (Last 24 hours) at 07/20/2023 0728 Last data filed at 07/20/2023 0500 Gross per 24 hour  Intake 720 ml  Output 975 ml  Net -255 ml   Filed Weights   07/18/23 0503  Weight: 59 kg     Physical Exam:  General: awake, alert, NAD. frail HEENT: atraumatic, clear conjunctiva, anicteric sclera, MMM, hard of hearing Respiratory: normal respiratory effort. Cardiovascular: quick capillary refill, normal S1/S2, RRR, no JVD, murmurs Gastrointestinal: soft, NT, ND Nervous: A&O x3. no gross focal neurologic deficits, normal speech Extremities: moves all equally, no edema, normal tone Skin: dry, intact, normal temperature, normal color. No rashes, lesions or ulcers on exposed skin Psychiatry: normal mood, congruent affect  Labs   I have personally reviewed the following  labs and imaging studies CBC    Component Value Date/Time   WBC 8.3 07/18/2023 0837   RBC 4.52 07/18/2023 0837   HGB 14.4 07/18/2023 0837   HGB 14.5 08/13/2021 1410   HCT 40.4 07/18/2023 0837   HCT 41.2 08/13/2021 1410   PLT 140 (L) 07/18/2023 0837   PLT 176 08/13/2021 1410   MCV 89.4 07/18/2023 0837   MCV 90 08/13/2021 1410   MCH 31.9 07/18/2023 0837   MCHC 35.6 07/18/2023 0837   RDW 13.0 07/18/2023 0837   RDW 11.8 08/13/2021 1410   LYMPHSABS 3.5 07/18/2023 0837   LYMPHSABS 4.0 (H) 08/13/2021 1410   MONOABS 1.0 07/18/2023  0837   EOSABS 0.2 07/18/2023 0837   EOSABS 0.2 08/13/2021 1410   BASOSABS 0.0 07/18/2023 0837   BASOSABS 0.1 08/13/2021 1410      Latest Ref Rng & Units 07/20/2023    5:20 AM 07/19/2023    6:10 AM 07/18/2023    9:56 PM  BMP  Glucose 70 - 99 mg/dL 97  86    BUN 8 - 23 mg/dL 18  13    Creatinine 1.61 - 1.24 mg/dL 0.96  0.45    Sodium 409 - 145 mmol/L 133  131  127   Potassium 3.5 - 5.1 mmol/L 3.9  3.8    Chloride 98 - 111 mmol/L 101  96    CO2 22 - 32 mmol/L 23  25    Calcium 8.9 - 10.3 mg/dL 8.4  8.6      CT Head Wo Contrast  Result Date: 07/18/2023 CLINICAL DATA:  Mental status change with unknown cause EXAM: CT HEAD WITHOUT CONTRAST TECHNIQUE: Contiguous axial images were obtained from the base of the skull through the vertex without intravenous contrast. RADIATION DOSE REDUCTION: This exam was performed according to the departmental dose-optimization program which includes automated exposure control, adjustment of the mA and/or kV according to patient size and/or use of iterative reconstruction technique. COMPARISON:  03/17/2022 FINDINGS: Brain: No evidence of acute infarction, hemorrhage, hydrocephalus, extra-axial collection or mass lesion/mass effect. Generalized brain atrophy with ventriculomegaly. Vascular: No hyperdense vessel or unexpected calcification. Skull: Normal. Negative for fracture or focal lesion. Sinuses/Orbits: No acute finding. Chronic anterior nasal septal perforation. IMPRESSION: Aging brain without acute or reversible finding. Electronically Signed   By: Tiburcio Pea M.D.   On: 07/18/2023 08:42    Disposition Plan & Communication  Patient status: Inpatient  Admitted From: Home Planned disposition location: Home health Anticipated discharge date: 9/16 pending patient agree to leave hospital  Family Communication: none at bedside    Author: Leeroy Bock, DO [facility] 07/20/2023, 7:28 AM   Available by Epic secure chat 7AM-7PM. If 7PM-7AM,  please contact night-coverage.  [facility] contact information found on [email or web address].

## 2023-07-20 NOTE — Progress Notes (Signed)
Physical Therapy Treatment Patient Details Name: Douglas Peterson. MRN: [id number] DOB: 11-04-1950 Today's Date: 07/20/2023   History of Present Illness [name] a 72 year old male with medical history significant of alcohol abuse, PAF, HTN, presented with alcohol intoxication, generalized weakness, hyponatremia after recent discharge home from the hospital on 9/13.    PT Comments  Pt resting in bed upon PT arrival; pt c/o nausea but agreeable to therapy (nurse reports recent nausea medication).  During session pt modified independent with bed mobility; SBA with transfers; and CGA progressing to SBA ambulating 240 feet with SPC use (1 brief standing rest break d/t nausea).  Generalized weakness noted.  Will continue to focus on strengthening, balance, and progressive functional mobility during hospitalization.   If plan is discharge home, recommend the following: Assistance with cooking/housework;Direct supervision/assist for medications management;Direct supervision/assist for financial management;Assist for transportation;Help with stairs or ramp for entrance;Supervision due to cognitive status;A little help with walking and/or transfers;A little help with bathing/dressing/bathroom   Can travel by private vehicle     Yes  Equipment Recommendations  None recommended by PT    Recommendations for Other Services       Precautions / Restrictions Precautions Precautions: Fall Restrictions Weight Bearing Restrictions: No     Mobility  Bed Mobility Overal bed mobility: Modified Independent Bed Mobility: Supine to Sit, Sit to Supine     Supine to sit: Modified independent (Device/Increase time) Sit to supine: Modified independent (Device/Increase time)   General bed mobility comments: No difficulties noted    Transfers Overall transfer level: Needs assistance Equipment used: Straight cane Transfers: Sit to/from Stand Sit to Stand: Supervision           General transfer  comment: steady transfer from bed    Ambulation/Gait Ambulation/Gait assistance: Contact guard assist, Supervision Gait Distance (Feet): 240 Feet Assistive device: Straight cane Gait Pattern/deviations: Step-through pattern Gait velocity: decreased     General Gait Details: R LE externally rotated; 1 brief standing rest break d/t nausea   Stairs             Wheelchair Mobility     Tilt Bed    Modified Rankin (Stroke Patients Only)       Balance Overall balance assessment: Needs assistance Sitting-balance support: No upper extremity supported, Feet supported Sitting balance-Leahy Scale: Good Sitting balance - Comments: steady reaching within BOS   Standing balance support: Single extremity supported, During functional activity, Reliant on assistive device for balance Standing balance-Leahy Scale: Good Standing balance comment: steady ambulating with SPC                            Cognition Arousal: Alert Behavior During Therapy: WFL for tasks assessed/performed Overall Cognitive Status: Within Functional Limits for tasks assessed                                          Exercises      General Comments  Nursing cleared pt for participation in physical therapy.  Pt agreeable to PT session.      Pertinent Vitals/Pain Pain Assessment Pain Assessment: 0-10 Pain Score: 6  Pain Location: Plantar surface of R foot with weight bearing (pt reports chronic for past 15 years); abdominal pain Pain Descriptors / Indicators: Tender, Aching, Discomfort Pain Intervention(s): Limited activity within patient's tolerance, Monitored during session, Repositioned, Other (  comment) (pt declined pain meds) Vitals (HR and SpO2 on room air) stable and WFL throughout treatment session.    Home Living                          Prior Function            PT Goals (current goals can now be found in the care plan section) Acute Rehab PT  Goals Patient Stated Goal: to go home when feeling better PT Goal Formulation: With patient Time For Goal Achievement: 08/02/23 Potential to Achieve Goals: Fair Progress towards PT goals: Progressing toward goals    Frequency    Min 1X/week      PT Plan      Co-evaluation              AM-PAC PT "6 Clicks" Mobility   Outcome Measure  Help needed turning from your back to your side while in a flat bed without using bedrails?: None Help needed moving from lying on your back to sitting on the side of a flat bed without using bedrails?: None Help needed moving to and from a bed to a chair (including a wheelchair)?: A Little Help needed standing up from a chair using your arms (e.g., wheelchair or bedside chair)?: A Little Help needed to walk in hospital room?: A Little Help needed climbing 3-5 steps with a railing? : A Little 6 Click Score: 20    End of Session Equipment Utilized During Treatment: Gait belt Activity Tolerance: Patient tolerated treatment well Patient left: in bed;with call bell/phone within reach;with bed alarm set Nurse Communication: Mobility status;Precautions;Other (comment) ([name] nausea (nurse reports recent nausea medication)) PT Visit Diagnosis: Other abnormalities of gait and mobility (R26.89);Muscle weakness (generalized) (M62.81)     Time: 5638-7564 PT Time Calculation (min) (ACUTE ONLY): 18 min  Charges:    $Therapeutic Activity: 8-22 mins PT General Charges $$ ACUTE PT VISIT: 1 Visit                     Hendricks Limes, PT 07/20/23, 1:05 PM

## 2023-07-20 NOTE — Discharge Summary (Signed)
Physician Discharge Summary  Patient: Douglas Peterson. ZOX:[id number] DOB: 1951/10/21   Code Status: Full Code Admit date: 07/18/2023 Discharge date: 07/21/2023 Disposition: Home health, PT PCP: Pcp, No  Recommendations for Outpatient Follow-up:  Follow up with PCP within 1-2 weeks Regarding general hospital follow up and preventative care Recommend monitoring electrolytes  Discharge Diagnoses:  Principal Problem:   Hyponatremia Active Problems:   Abdominal pain   Alcohol abuse   Nausea  Brief Hospital Course Summary: Douglas Peterson. is a 72 y.o. male with a PMH significant for PAF, alcohol dependence, HTN.   They presented from home to the ED on 07/18/2023 with nausea and feeling unwell. He was discharged less than 24 hours prior to presentation due to hyponatremia and alcohol detox.     In the ED, it was found that they had stable vital signs.  Significant findings included ethanol level 30 on presentation, unremarkable CBC, Na+ 121, K+ 4.1, Cr 0.46.    They were initially treated with his home medications, librium, sodium tablets, IVF bolus.    Patient was admitted to medicine service for further workup and management of hyponatremia as outlined in detail below.   He had mild nausea on presentation that was treated with zofran and he was able to tolerate a regular diet throughout his admission. PT/OT and mobility tech evaluated him and he was very independent for ambulation long distances. His Na+ improved to 130s which is his baseline. He had no admission criteria but refused discharge for several days due to not feeling well yet. He was not able to further describe what didn't feel well.  On day of discharge, his Na+ was 134, eating full meals and ambulating around the hospital. [facility] PT was ordered.   Discharge Condition: Good, improved Recommended discharge diet: Regular healthy diet  Consultations: None   Procedures/Studies: None   Subjective   Pt reports feeling  improved. Denies HA, CP, SOB, abdominal pain, diarrhea, nausea. He feels like he can go home today if he must.   All questions and concerns were addressed at time of discharge.  Objective  Blood pressure 112/71, pulse 64, temperature 98 F (36.7 C), resp. rate 20, height 5\' 3"  (1.6 m), weight 59 kg, SpO2 97%.   General: Pt is alert, awake, not in acute distress. Frail appearance  Cardiovascular: RRR, S1/S2 +, no rubs, no gallops Respiratory: CTA bilaterally, no wheezing, no rhonchi Abdominal: Soft, NT, ND, bowel sounds + Extremities: no edema, no cyanosis  The results of significant diagnostics from this hospitalization (including imaging, microbiology, ancillary and laboratory) are listed below for reference.   Imaging studies: CT Head Wo Contrast  Result Date: 07/18/2023 CLINICAL DATA:  Mental status change with unknown cause EXAM: CT HEAD WITHOUT CONTRAST TECHNIQUE: Contiguous axial images were obtained from the base of the skull through the vertex without intravenous contrast. RADIATION DOSE REDUCTION: This exam was performed according to the departmental dose-optimization program which includes automated exposure control, adjustment of the mA and/or kV according to patient size and/or use of iterative reconstruction technique. COMPARISON:  03/17/2022 FINDINGS: Brain: No evidence of acute infarction, hemorrhage, hydrocephalus, extra-axial collection or mass lesion/mass effect. Generalized brain atrophy with ventriculomegaly. Vascular: No hyperdense vessel or unexpected calcification. Skull: Normal. Negative for fracture or focal lesion. Sinuses/Orbits: No acute finding. Chronic anterior nasal septal perforation. IMPRESSION: Aging brain without acute or reversible finding. Electronically Signed   By: Tiburcio Pea M.D.   On: 07/18/2023 08:42   DG Chest Portable 1  View  Result Date: 07/11/2023 CLINICAL DATA:  Status post fall with subsequent right shoulder pain. EXAM: PORTABLE CHEST 1 VIEW  COMPARISON:  June 16, 2023 FINDINGS: The heart size and mediastinal contours are within normal limits. Low lung volumes are noted. There is no evidence of acute infiltrate, pleural effusion or pneumothorax. Chronic fracture deformities are seen involving the distal right clavicle and proximal right humerus. Chronic bilateral rib fractures are also noted. No acute osseous abnormality is identified. IMPRESSION: Low lung volumes without acute or active cardiopulmonary disease. Electronically Signed   By: Aram Candela M.D.   On: 07/11/2023 00:58    Labs: Basic Metabolic Panel: Recent Labs  Lab 07/17/23 0430 07/18/23 0837 07/18/23 1350 07/18/23 2156 07/19/23 0610 07/20/23 0520 07/21/23 0426  NA 132* 121* 122* 127* 131* 133* 134*  K 4.0 4.1  --   --  3.8 3.9 3.7  CL 96* 87*  --   --  96* 101 98  CO2 26 23  --   --  25 23 26   GLUCOSE 93 76  --   --  86 97 95  BUN 16 10  --   --  13 18 15   CREATININE 0.44* 0.46*  --   --  0.47* 0.45* 0.58*  CALCIUM 9.0 8.4*  --   --  8.6* 8.4* 8.6*   CBC: Recent Labs  Lab 07/15/23 1016 07/17/23 0430 07/18/23 0837  WBC 5.7 6.7 8.3  NEUTROABS 2.7  --  3.6  HGB 14.6 14.4 14.4  HCT 42.8 41.7 40.4  MCV 93.2 91.0 89.4  PLT 78* 110* 140*   Microbiology: Results for orders placed or performed during the hospital encounter of 07/18/23  SARS Coronavirus 2 by RT PCR (hospital order, performed in [facility] hospital lab) *cepheid single result test* Anterior Nasal Swab     Status: None   Collection Time: 07/18/23  8:37 AM   Specimen: Anterior Nasal Swab  Result Value Ref Range Status   SARS Coronavirus 2 by RT PCR NEGATIVE NEGATIVE Final    Comment: (NOTE) SARS-CoV-2 target nucleic acids are NOT DETECTED.  The SARS-CoV-2 RNA is generally detectable in upper and lower respiratory specimens during the acute phase of infection. The lowest concentration of SARS-CoV-2 viral copies this assay can detect is 250 copies / mL. A negative result does not  preclude SARS-CoV-2 infection and should not be used as the sole basis for treatment or other patient management decisions.  A negative result may occur with improper specimen collection / handling, submission of specimen other than nasopharyngeal swab, presence of viral mutation(s) within the areas targeted by this assay, and inadequate number of viral copies (<250 copies / mL). A negative result must be combined with clinical observations, patient history, and epidemiological information.  Fact Sheet for Patients:   RoadLapTop.co.za  Fact Sheet for Healthcare Providers: http://kim-miller.com/  This test is not yet approved or  cleared by the [place] FDA and has been authorized for detection and/or diagnosis of SARS-CoV-2 by FDA under an Emergency Use Authorization (EUA).  This EUA will remain in effect (meaning this test can be used) for the duration of the COVID-19 declaration under Section 564(b)(1) of the Act, 21 U.S.C. section 360bbb-3(b)(1), unless the authorization is terminated or revoked sooner.  Performed at [facility], [address] [place], [place] [address]     Time coordinating discharge: Over 30 minutes  Leeroy Bock, MD  [facility] 07/21/2023, 2:14 PM

## 2023-07-20 NOTE — Progress Notes (Signed)
Mobility Specialist - Progress Note   07/20/23 0809  Mobility  Activity Ambulated with assistance in hallway;Stood at bedside;Dangled on edge of bed  Level of Assistance Standby assist, set-up cues, supervision of patient - no hands on  Assistive Device Cane  Distance Ambulated (ft) 500 ft  Activity Response Tolerated well  Mobility Referral Yes  $Mobility charge 1 Mobility  Mobility Specialist Start Time (ACUTE ONLY) 0744  Mobility Specialist Stop Time (ACUTE ONLY) 0803  Mobility Specialist Time Calculation (min) (ACUTE ONLY) 19 min   Pt supine in bed on RA upon arrival. Pt completes bed mobility HHA with no LOB noted. Pt STS and ambulates in hallway SBA. Pt left in recliner with needs in reach and chair alarm activated.   Terrilyn Saver  Mobility Specialist  07/20/23 8:10 AM

## 2023-07-21 DIAGNOSIS — R110 Nausea: Secondary | ICD-10-CM | POA: Diagnosis not present

## 2023-07-21 DIAGNOSIS — E871 Hypo-osmolality and hyponatremia: Secondary | ICD-10-CM | POA: Diagnosis not present

## 2023-07-21 LAB — BASIC METABOLIC PANEL
Anion gap: 10 (ref 5–15)
BUN: 15 mg/dL (ref 8–23)
CO2: 26 mmol/L (ref 22–32)
Calcium: 8.6 mg/dL — ABNORMAL LOW (ref 8.9–10.3)
Chloride: 98 mmol/L (ref 98–111)
Creatinine, Ser: 0.58 mg/dL — ABNORMAL LOW (ref 0.61–1.24)
GFR, Estimated: >60 mL/min (ref >60)
Glucose, Bld: 95 mg/dL (ref 70–99)
Potassium: 3.7 mmol/L (ref 3.5–5.1)
Sodium: 134 mmol/L — ABNORMAL LOW (ref 135–145)

## 2023-07-21 MED ORDER — ATENOLOL 25 MG PO TABS: 25.0000 mg | ORAL_TABLET | Freq: Every day | ORAL | 0 refills | Status: DC

## 2023-07-21 MED ORDER — BACLOFEN 10 MG PO TABS: 10.0000 mg | ORAL_TABLET | Freq: Three times a day (TID) | ORAL | 0 refills | Status: DC

## 2023-07-21 MED ORDER — ATORVASTATIN CALCIUM 10 MG PO TABS: 10.0000 mg | ORAL_TABLET | Freq: Every day | ORAL | 0 refills | Status: DC

## 2023-07-21 MED ORDER — ONDANSETRON HCL 4 MG PO TABS: 4.0000 mg | ORAL_TABLET | Freq: Four times a day (QID) | ORAL | 0 refills | Status: DC | PRN

## 2023-07-21 MED ORDER — TAMSULOSIN HCL 0.4 MG PO CAPS: 0.4000 mg | ORAL_CAPSULE | Freq: Every day | ORAL | 0 refills | Status: DC

## 2023-07-21 NOTE — Plan of Care (Signed)

## 2023-07-21 NOTE — Progress Notes (Incomplete)
PROGRESS NOTE  Douglas Peterson.    DOB: 04/24/1951, 72 y.o.  UYQ:[id number]    Code Status: Full Code   DOA: 07/18/2023   LOS: 3   Brief hospital course  Douglas Kazmer. is a 72 y.o. male with a PMH significant for PAF, alcohol dependence, HTN.  They presented from home to the ED on 07/18/2023 with nausea and feeling unwell. He was discharged less than 24 hours prior to presentation due to hyponatremia and alcohol detox.    In the ED, it was found that they had stable vital signs.  Significant findings included ethanol level 30 on presentation, unremarkable CBC, Na+ 121, K+ 4.1, Cr 0.46.   They were initially treated with his home medications, librium, sodium tablets, IVF bolus.   Patient was admitted to medicine service for further workup and management of hyponatremia as outlined in detail below.  07/21/23 -stable, improved. Hemodynamically stable. His Na+ is stable at his baseline. He is able to tolerate a normal diet. He is ambulating independently. Has no indication to remain hospitalized but he is refusing discharge again today due to "still don't feel well enough to go home".  Assessment & Plan  Principal Problem:   Hyponatremia Active Problems:   Abdominal pain   Alcohol abuse   Nausea  Acute on chronic hyponatremia- Na+ 638>756>433.  -Euvolemic, likely dilutional from beer potomania -Fluid restriction -continue sodium chloride tablet daily - BMP am   Alcohol dependence -CIWA protocol with as needed benzos   Nausea  vomiting- resolved with treatment. No vomit. Tolerating normal diet.  - ondansetron PRN   HTN -Continue atenolol  Physical deconditioning- he walked independently all the way to the gift shop and back but says he's too weak to discharge.  - PT recommends HH  Body mass index is 23.04 kg/m.  VTE ppx: enoxaparin (LOVENOX) injection 40 mg Start: 07/18/23 2000  Diet:     Diet   Diet Heart Room service appropriate? Yes; Fluid consistency: Thin;  Fluid restriction: 2000 mL Fluid   Consultants: None   Subjective 07/21/23    Pt reports nausea this morning which resolved with zofran. Having no difficulty with eating. Has been walking in room and hallways independently. Thinks he does not feel well enough to go home yet.   Objective   Vitals:   07/19/23 2306 07/20/23 1538 07/20/23 2015 07/21/23 0550  BP: 112/71 134/66 127/69 119/79  Pulse: 64 (!) 57 63 (!) 56  Resp: 20 16 20 20   Temp: 98 F (36.7 C) (!) 97.4 F (36.3 C) 98.3 F (36.8 C) 97.8 F (36.6 C)  TempSrc:   Oral Oral  SpO2: 97% 96% 94%   Weight:      Height:        Intake/Output Summary (Last 24 hours) at 07/21/2023 0736 Last data filed at 07/21/2023 0038 Gross per 24 hour  Intake 360 ml  Output 600 ml  Net -240 ml   Filed Weights   07/18/23 0503  Weight: 59 kg     Physical Exam:  General: awake, alert, NAD. frail HEENT: atraumatic, clear conjunctiva, anicteric sclera, MMM, hard of hearing Respiratory: normal respiratory effort. Cardiovascular: quick capillary refill, normal S1/S2, RRR, no JVD, murmurs Gastrointestinal: soft, NT, ND Nervous: A&O x3. no gross focal neurologic deficits, normal speech Extremities: moves all equally, no edema, normal tone Skin: dry, intact, normal temperature, normal color. No rashes, lesions or ulcers on exposed skin Psychiatry: normal mood, congruent affect  Labs   I have personally  reviewed the following labs and imaging studies CBC    Component Value Date/Time   WBC 8.3 07/18/2023 0837   RBC 4.52 07/18/2023 0837   HGB 14.4 07/18/2023 0837   HGB 14.5 08/13/2021 1410   HCT 40.4 07/18/2023 0837   HCT 41.2 08/13/2021 1410   PLT 140 (L) 07/18/2023 0837   PLT 176 08/13/2021 1410   MCV 89.4 07/18/2023 0837   MCV 90 08/13/2021 1410   MCH 31.9 07/18/2023 0837   MCHC 35.6 07/18/2023 0837   RDW 13.0 07/18/2023 0837   RDW 11.8 08/13/2021 1410   LYMPHSABS 3.5 07/18/2023 0837   LYMPHSABS 4.0 (H) 08/13/2021 1410    MONOABS 1.0 07/18/2023 0837   EOSABS 0.2 07/18/2023 0837   EOSABS 0.2 08/13/2021 1410   BASOSABS 0.0 07/18/2023 0837   BASOSABS 0.1 08/13/2021 1410      Latest Ref Rng & Units 07/21/2023    4:26 AM 07/20/2023    5:20 AM 07/19/2023    6:10 AM  BMP  Glucose 70 - 99 mg/dL 95  97  86   BUN 8 - 23 mg/dL 15  18  13    Creatinine 0.61 - 1.24 mg/dL 1.61  0.96  0.45   Sodium 135 - 145 mmol/L 134  133  131   Potassium 3.5 - 5.1 mmol/L 3.7  3.9  3.8   Chloride 98 - 111 mmol/L 98  101  96   CO2 22 - 32 mmol/L 26  23  25    Calcium 8.9 - 10.3 mg/dL 8.6  8.4  8.6     No results found.  Disposition Plan & Communication  Patient status: Inpatient  Admitted From: Home Planned disposition location: Home health Anticipated discharge date: 9/16 pending patient agree to leave hospital  Family Communication: none at bedside    Author: Leeroy Bock, DO [facility] 07/21/2023, 7:36 AM   Available by Epic secure chat 7AM-7PM. If 7PM-7AM, please contact night-coverage.  [facility] contact information found on [email or web address].

## 2023-07-21 NOTE — Discharge Instructions (Signed)
Continue to take your home medications as directed. They have been refilled to your pharmacy.  Zofran has been prescribed to help relieve nausea.  I recommend no alcohol intake as this is irritating to your stomach.  Please follow up with your primary doctor within the next 1-2 weeks.  Home health physical therapy will be contacting you to set up an appointment in your home

## 2023-07-21 NOTE — Care Management Important Message (Signed)
Important Message  Patient Details  Name: Douglas Peterson. MRN: [id number] Date of Birth: 1951-01-28   Medicare Important Message Given:  N/A - LOS <3 / Initial given by admissions     [name] 07/21/2023, 10:11 AM

## 2023-07-21 NOTE — TOC Progression Note (Signed)
Transition of Care (TOC) - Progression Note    Patient Details  Name: Douglas Peterson. MRN: [id number] Date of Birth: [birth year]  Transition of Care [facility]) CM/SW Contact  Marlowe Sax, RN Phone Number: 07/21/2023, 2:44 PM  Clinical Narrative:     Met with the patient at the bedside, he has everything he needs at home, he refused services from [facility] service company [employer], he stated he prefers to be in the Hospital, he has a warm bed, 3 hot meals and company plus someone to take care of him, I reminded him that there are sick people in the hospital and we would not want him to get sick but everytime he comes to the hospital he takes the risk of getting exposed to germs he wouldn't normally be exposed to I provided him with a Taxi  voucher that he signed, I faxed it to the [name] taxi company   Expected Discharge Plan: Home/Self Care Barriers to Discharge: No Barriers Identified  Expected Discharge Plan and Services   Discharge Planning Services: CM Consult   Living arrangements for the past 2 months: Single Family Home Expected Discharge Date: 07/21/23               DME Arranged: N/A DME Agency: NA                   Social Determinants of Health (SDOH) Interventions SDOH Screenings   Food Insecurity: No Food Insecurity (07/21/2023)  Housing: Low Risk  (07/21/2023)  Transportation Needs: No Transportation Needs (07/21/2023)  Utilities: Not At Risk (07/21/2023)  Financial Resource Strain: Unknown (06/13/2019)  Physical Activity: Unknown (06/13/2019)  Social Connections: Unknown (06/13/2019)  Stress: Unknown (06/13/2019)  Tobacco Use: Medium Risk (07/18/2023)    Readmission Risk Interventions    06/17/2023    4:23 PM 04/29/2022   10:26 AM 03/14/2022   12:48 PM  Readmission Risk Prevention Plan  Transportation Screening Complete Complete Complete  PCP or Specialist Appt within 3-5 Days   Complete  HRI or Home Care Consult Complete Complete Complete   Social Work Consult for Recovery Care Planning/Counseling Complete Complete Complete  Palliative Care Screening Not Applicable Not Applicable Not Applicable  Medication Review Oceanographer) Complete Complete Complete

## 2023-07-23 ENCOUNTER — Emergency Department: Payer: Medicare HMO

## 2023-07-23 ENCOUNTER — Emergency Department
Admission: EM | Admit: 2023-07-23 | Discharge: 2023-07-23 | Disposition: A | Discharge status: Home / Self Care | Payer: Medicare HMO | Attending: Emergency Medicine | Admitting: Emergency Medicine

## 2023-07-23 DIAGNOSIS — E871 Hypo-osmolality and hyponatremia: Secondary | ICD-10-CM | POA: Insufficient documentation

## 2023-07-23 DIAGNOSIS — F10120 Alcohol abuse with intoxication, uncomplicated: Secondary | ICD-10-CM | POA: Insufficient documentation

## 2023-07-23 DIAGNOSIS — F1010 Alcohol abuse, uncomplicated: Secondary | ICD-10-CM | POA: Insufficient documentation

## 2023-07-23 DIAGNOSIS — Y906 Blood alcohol level of 120-199 mg/100 ml: Secondary | ICD-10-CM | POA: Diagnosis not present

## 2023-07-23 DIAGNOSIS — D72829 Elevated white blood cell count, unspecified: Secondary | ICD-10-CM | POA: Insufficient documentation

## 2023-07-23 DIAGNOSIS — R531 Weakness: Secondary | ICD-10-CM | POA: Insufficient documentation

## 2023-07-23 LAB — CBC WITH DIFFERENTIAL/PLATELET
Abs Immature Granulocytes: 0.05 10*3/uL (ref 0.00–0.07)
Basophils Absolute: 0.1 10*3/uL (ref 0.0–0.1)
Basophils Relative: 1 %
Eosinophils Absolute: 0.1 10*3/uL (ref 0.0–0.5)
Eosinophils Relative: 1 %
HCT: 41.4 % (ref 39.0–52.0)
Hemoglobin: 14.1 g/dL (ref 13.0–17.0)
Immature Granulocytes: 0 %
Lymphocytes Relative: 43 %
Lymphs Abs: 6.6 10*3/uL — ABNORMAL HIGH (ref 0.7–4.0)
MCH: 31.6 pg (ref 26.0–34.0)
MCHC: 34.1 g/dL (ref 30.0–36.0)
MCV: 92.8 fL (ref 80.0–100.0)
Monocytes Absolute: 0.8 10*3/uL (ref 0.1–1.0)
Monocytes Relative: 6 %
Neutro Abs: 7.6 10*3/uL (ref 1.7–7.7)
Neutrophils Relative %: 49 %
Platelets: 295 10*3/uL (ref 150–400)
RBC: 4.46 MIL/uL (ref 4.22–5.81)
RDW: 13.5 % (ref 11.5–15.5)
Smear Review: NORMAL
WBC: 15.4 10*3/uL — ABNORMAL HIGH (ref 4.0–10.5)
nRBC: 0 % (ref 0.0–0.2)

## 2023-07-23 LAB — URINALYSIS, ROUTINE W REFLEX MICROSCOPIC
Bilirubin Urine: NEGATIVE
Glucose, UA: NEGATIVE mg/dL
Hgb urine dipstick: NEGATIVE
Ketones, ur: NEGATIVE mg/dL
Leukocytes,Ua: NEGATIVE
Nitrite: NEGATIVE
Protein, ur: NEGATIVE mg/dL
Specific Gravity, Urine: 1.002 — ABNORMAL LOW (ref 1.005–1.030)
pH: 5 (ref 5.0–8.0)

## 2023-07-23 LAB — COMPREHENSIVE METABOLIC PANEL
ALT: 38 U/L (ref 0–44)
AST: 27 U/L (ref 15–41)
Albumin: 4.1 g/dL (ref 3.5–5.0)
Alkaline Phosphatase: 68 U/L (ref 38–126)
Anion gap: 13 (ref 5–15)
BUN: 6 mg/dL — ABNORMAL LOW (ref 8–23)
CO2: 24 mmol/L (ref 22–32)
Calcium: 8.6 mg/dL — ABNORMAL LOW (ref 8.9–10.3)
Chloride: 92 mmol/L — ABNORMAL LOW (ref 98–111)
Creatinine, Ser: 0.36 mg/dL — ABNORMAL LOW (ref 0.61–1.24)
GFR, Estimated: >60 mL/min (ref >60)
Glucose, Bld: 84 mg/dL (ref 70–99)
Potassium: 3.7 mmol/L (ref 3.5–5.1)
Sodium: 129 mmol/L — ABNORMAL LOW (ref 135–145)
Total Bilirubin: 0.9 mg/dL (ref 0.3–1.2)
Total Protein: 7.3 g/dL (ref 6.5–8.1)

## 2023-07-23 LAB — TROPONIN I (HIGH SENSITIVITY): Troponin I (High Sensitivity): 4 ng/L (ref <18)

## 2023-07-23 LAB — CBG MONITORING, ED: Glucose-Capillary: 94 mg/dL (ref 70–99)

## 2023-07-23 LAB — ETHANOL: Alcohol, Ethyl (B): 197 mg/dL — ABNORMAL HIGH (ref <10)

## 2023-07-23 MED ORDER — SODIUM CHLORIDE 0.9 % IV BOLUS
1000.0000 mL | Freq: Once | INTRAVENOUS | Status: AC
  Administered 2023-07-23: 1000 mL via INTRAVENOUS

## 2023-07-23 NOTE — ED Notes (Signed)
Changed pt's brief and pt used urinal.

## 2023-07-23 NOTE — ED Notes (Signed)
Pt incontinent of urine. Changed brief and chucks. Unable to get a urine sample. Given warm blanket.

## 2023-07-23 NOTE — ED Provider Notes (Addendum)
[facility] Provider Note    Event Date/Time   First MD Initiated Contact with Patient 07/23/23 0401     (approximate)   History   Alcohol Problem  Level 5 caveat:  history/ROS limited by acute intoxication  HPI Douglas Peterson. is a 72 y.o. male well-known to the emergency department for frequent visits for alcohol abuse and associated complications such as hyponatremia/potomania.  He presents by EMS for the same.  He states that he has been drinking and he was too weak to get out of his chair or bed.  He is sleeping currently and will only wake up for short periods of time.  He denies any pain at this time.  He confirms the recent alcohol use.     Physical Exam   Triage Vital Signs: ED Triage Vitals [07/23/23 0221]  Encounter Vitals Group     BP 134/80     Systolic BP Percentile      Diastolic BP Percentile      Pulse Rate 69     Resp 18     Temp 97.7 F (36.5 C)     Temp Source Oral     SpO2 95 %     Weight      Height      Head Circumference      Peak Flow      Pain Score      Pain Loc      Pain Education      Exclude from Growth Chart     Most recent vital signs: Vitals:   07/23/23 0221 07/23/23 0615  BP: 134/80 128/77  Pulse: 69 68  Resp: 18 18  Temp: 97.7 F (36.5 C) 97.9 F (36.6 C)  SpO2: 95% 96%    General: Sleeping but awakens to loud voice, appears to roughly be at his baseline.  Is able to answer simple questions. CV:  Good peripheral perfusion.  Regular rate and rhythm. Resp:  Normal effort. no accessory muscle usage nor intercostal retractions.  Lungs clear to auscultation. Abd:  No distention.  No tenderness to palpation. Other:  No obvious focal neurological deficits.   ED Results / Procedures / Treatments   Labs (all labs ordered are listed, but only abnormal results are displayed) Labs Reviewed  CBC WITH DIFFERENTIAL/PLATELET - Abnormal; Notable for the following components:      Result Value   WBC 15.4  (*)    Lymphs Abs 6.6 (*)    All other components within normal limits  ETHANOL - Abnormal; Notable for the following components:   Alcohol, Ethyl (B) 197 (*)    All other components within normal limits  COMPREHENSIVE METABOLIC PANEL - Abnormal; Notable for the following components:   Sodium 129 (*)    Chloride 92 (*)    BUN 6 (*)    Creatinine, Ser 0.36 (*)    Calcium 8.6 (*)    All other components within normal limits  URINALYSIS, ROUTINE W REFLEX MICROSCOPIC  CBG MONITORING, ED  TROPONIN I (HIGH SENSITIVITY)     EKG  ED ECG REPORT I, Loleta Rose, the attending physician, personally viewed and interpreted this ECG.  Date: 07/23/2023 EKG Time: 2:19 AM Rate: 67 Rhythm: normal sinus rhythm QRS Axis: normal Intervals: normal ST/T Wave abnormalities: normal Narrative Interpretation: no evidence of acute ischemia     PROCEDURES:  Critical Care performed: No  Procedures    IMPRESSION / MDM / ASSESSMENT AND PLAN / ED COURSE  I reviewed the triage vital signs and the nursing notes.                              Differential diagnosis includes, but is not limited to, hyponatremia or other acute electrolyte abnormality, renal dysfunction, acute intoxication  Patient's presentation is most consistent with acute presentation with potential threat to life or bodily function.  Labs/studies ordered: CMP, ethanol level, CBC with differential, high-sensitivity troponin, CBG, EKG  Ethanol level is medically necessary given the patient's history of alcohol abuse, and his confusion and altered mental status, and the probability that his chronic alcohol abuse and/or acute intoxication are affecting his current presentation.   Interventions/Medications given:  Medications  sodium chloride 0.9 % bolus 1,000 mL (1,000 mLs Intravenous New Bag/Given 07/23/23 0745)    (Note:  hospital course my include additional interventions and/or labs/studies not listed above.)   Patient  seems to be at his baseline and has stable vital signs.  His CBC shows a mild leukocytosis of 15 which has increased from prior but he has no signs or symptoms of infection.  I ordered a urinalysis if he is able to give a specimen.  His ethanol level is elevated at 197 and is high sensitivity troponin is normal.  EKG is reassuring as well.  No indication for emergent imaging.  We will continue to try and get a metabolic panel and a urine specimen if possible.  I anticipate he will again be hyponatremic and may require admission as it did last time but he has a very difficult IV access and we are awaiting the lab's assistance.   Clinical Course as of 07/23/23 0756  Thu Jul 23, 2023  0981 Metabolic panel is generally reassuring; he has some degree of hyponatremia, but it is not severe, and likely will be addressed with fluids.  I ordered 1 L normal saline IV bolus.  Transferring ED care to Dr. Roxan Hockey to reassess the patient once he has sobered up a bit and gotten some IV fluids to determine appropriateness for discharge. [CF]    Clinical Course User Index [CF] Loleta Rose, MD     FINAL CLINICAL IMPRESSION(S) / ED DIAGNOSES   Final diagnoses:  Generalized weakness  Alcohol abuse     Rx / DC Orders   ED Discharge Orders     None        Note:  This document was prepared using Dragon voice recognition software and may include unintentional dictation errors.   Loleta Rose, MD 07/23/23 1914    Loleta Rose, MD 08/09/23 2303

## 2023-07-23 NOTE — ED Provider Notes (Signed)
Patient received in signout from Dr. York Cerise pending follow-up observation and reassessment.  Patient now clinically at his baseline.  Presentation likely secondary to persistent alcohol abuse.  Chest x-ray on my review and interpretation without any evidence of consolidation.  Is not hypoxic.  Has mild leukocytosis but otherwise appears at baseline does appear stable and appropriate for outpatient follow-up at this time.   Willy Eddy, MD 07/23/23 1149

## 2023-07-23 NOTE — ED Notes (Signed)
Attempted recollect of blood draw at lab request; pt difficult stick and unable to obtain blood specimen after 2 attempts by this nurse; called lab and requested phlebotomist to come draw

## 2023-07-23 NOTE — ED Triage Notes (Signed)
Pt presents to triage via [facility] from home with some weakness. Pt endorses ETOH consumption tonight and has been too weak to get up from his wheelchair and was covered in urine. Pt with visible redness to his buttock due to the constant sitting. A&Ox4 at this time. Denies CP or SOB.

## 2023-07-23 NOTE — ED Notes (Signed)
[facility]  CALLED  FOR  TRANSPORT  HOME

## 2023-07-23 NOTE — ED Notes (Signed)
Pt incontinent. Changed brief. Call light within reach. Bed in lowest position.

## 2023-07-27 ENCOUNTER — Other Ambulatory Visit: Payer: Self-pay

## 2023-07-27 ENCOUNTER — Encounter: Payer: Self-pay | Admitting: *Deleted

## 2023-07-27 DIAGNOSIS — R531 Weakness: Secondary | ICD-10-CM | POA: Insufficient documentation

## 2023-07-27 DIAGNOSIS — E871 Hypo-osmolality and hyponatremia: Secondary | ICD-10-CM | POA: Insufficient documentation

## 2023-07-27 DIAGNOSIS — I10 Essential (primary) hypertension: Secondary | ICD-10-CM | POA: Diagnosis not present

## 2023-07-27 DIAGNOSIS — R110 Nausea: Secondary | ICD-10-CM | POA: Diagnosis present

## 2023-07-27 DIAGNOSIS — Y908 Blood alcohol level of 240 mg/100 ml or more: Secondary | ICD-10-CM | POA: Insufficient documentation

## 2023-07-27 DIAGNOSIS — F10920 Alcohol use, unspecified with intoxication, uncomplicated: Secondary | ICD-10-CM | POA: Insufficient documentation

## 2023-07-27 DIAGNOSIS — F10120 Alcohol abuse with intoxication, uncomplicated: Secondary | ICD-10-CM | POA: Diagnosis not present

## 2023-07-27 LAB — BASIC METABOLIC PANEL
Anion gap: 16 — ABNORMAL HIGH (ref 5–15)
BUN: <5 mg/dL — ABNORMAL LOW (ref 8–23)
CO2: 22 mmol/L (ref 22–32)
Calcium: 8.3 mg/dL — ABNORMAL LOW (ref 8.9–10.3)
Chloride: 89 mmol/L — ABNORMAL LOW (ref 98–111)
Creatinine, Ser: 0.35 mg/dL — ABNORMAL LOW (ref 0.61–1.24)
GFR, Estimated: >60 mL/min (ref >60)
Glucose, Bld: 84 mg/dL (ref 70–99)
Potassium: 4.2 mmol/L (ref 3.5–5.1)
Sodium: 127 mmol/L — ABNORMAL LOW (ref 135–145)

## 2023-07-27 LAB — CBC
HCT: 43.8 % (ref 39.0–52.0)
Hemoglobin: 14.4 g/dL (ref 13.0–17.0)
MCH: 31.6 pg (ref 26.0–34.0)
MCHC: 32.9 g/dL (ref 30.0–36.0)
MCV: 96.1 fL (ref 80.0–100.0)
Platelets: 384 10*3/uL (ref 150–400)
RBC: 4.56 MIL/uL (ref 4.22–5.81)
RDW: 13.8 % (ref 11.5–15.5)
WBC: 14.3 10*3/uL — ABNORMAL HIGH (ref 4.0–10.5)
nRBC: 0 % (ref 0.0–0.2)

## 2023-07-27 LAB — ETHANOL: Alcohol, Ethyl (B): 303 mg/dL (ref <10)

## 2023-07-27 LAB — TROPONIN I (HIGH SENSITIVITY): Troponin I (High Sensitivity): 3 ng/L (ref <18)

## 2023-07-27 NOTE — ED Triage Notes (Signed)
Pt brought in via ems from home.  Pt here today for weakness and hurting all over.  Etoh use today.   Pt on stretcher in triage.  Siderails up x 2.

## 2023-07-28 ENCOUNTER — Emergency Department
Admission: EM | Admit: 2023-07-28 | Discharge: 2023-07-29 | Disposition: A | Discharge status: Home / Self Care | Payer: Medicare HMO | Source: Home / Self Care | Attending: Emergency Medicine | Admitting: Emergency Medicine

## 2023-07-28 ENCOUNTER — Other Ambulatory Visit: Payer: Self-pay

## 2023-07-28 ENCOUNTER — Emergency Department
Admission: EM | Admit: 2023-07-28 | Discharge: 2023-07-28 | Disposition: A | Discharge status: Home / Self Care | Payer: Medicare HMO | Attending: Emergency Medicine | Admitting: Emergency Medicine

## 2023-07-28 DIAGNOSIS — Y907 Blood alcohol level of 200-239 mg/100 ml: Secondary | ICD-10-CM | POA: Insufficient documentation

## 2023-07-28 DIAGNOSIS — F10920 Alcohol use, unspecified with intoxication, uncomplicated: Secondary | ICD-10-CM

## 2023-07-28 DIAGNOSIS — R110 Nausea: Secondary | ICD-10-CM | POA: Insufficient documentation

## 2023-07-28 DIAGNOSIS — F10120 Alcohol abuse with intoxication, uncomplicated: Secondary | ICD-10-CM | POA: Insufficient documentation

## 2023-07-28 DIAGNOSIS — I10 Essential (primary) hypertension: Secondary | ICD-10-CM | POA: Insufficient documentation

## 2023-07-28 DIAGNOSIS — E871 Hypo-osmolality and hyponatremia: Secondary | ICD-10-CM

## 2023-07-28 DIAGNOSIS — R531 Weakness: Secondary | ICD-10-CM

## 2023-07-28 LAB — CBC WITH DIFFERENTIAL/PLATELET
Abs Immature Granulocytes: 0.02 10*3/uL (ref 0.00–0.07)
Basophils Absolute: 0.2 10*3/uL — ABNORMAL HIGH (ref 0.0–0.1)
Basophils Relative: 2 %
Eosinophils Absolute: 0.1 10*3/uL (ref 0.0–0.5)
Eosinophils Relative: 1 %
HCT: 43 % (ref 39.0–52.0)
Hemoglobin: 14.1 g/dL (ref 13.0–17.0)
Immature Granulocytes: 0 %
Lymphocytes Relative: 64 %
Lymphs Abs: 7 10*3/uL — ABNORMAL HIGH (ref 0.7–4.0)
MCH: 31.8 pg (ref 26.0–34.0)
MCHC: 32.8 g/dL (ref 30.0–36.0)
MCV: 96.8 fL (ref 80.0–100.0)
Monocytes Absolute: 0.6 10*3/uL (ref 0.1–1.0)
Monocytes Relative: 5 %
Neutro Abs: 3.1 10*3/uL (ref 1.7–7.7)
Neutrophils Relative %: 28 %
Platelets: 309 10*3/uL (ref 150–400)
RBC: 4.44 MIL/uL (ref 4.22–5.81)
RDW: 13.6 % (ref 11.5–15.5)
WBC: 11 10*3/uL — ABNORMAL HIGH (ref 4.0–10.5)
nRBC: 0 % (ref 0.0–0.2)

## 2023-07-28 LAB — TROPONIN I (HIGH SENSITIVITY): Troponin I (High Sensitivity): 3 ng/L (ref <18)

## 2023-07-28 LAB — COMPREHENSIVE METABOLIC PANEL
ALT: 47 U/L — ABNORMAL HIGH (ref 0–44)
AST: 56 U/L — ABNORMAL HIGH (ref 15–41)
Albumin: 4 g/dL (ref 3.5–5.0)
Alkaline Phosphatase: 93 U/L (ref 38–126)
Anion gap: 15 (ref 5–15)
BUN: <5 mg/dL — ABNORMAL LOW (ref 8–23)
CO2: 22 mmol/L (ref 22–32)
Calcium: 8.3 mg/dL — ABNORMAL LOW (ref 8.9–10.3)
Chloride: 91 mmol/L — ABNORMAL LOW (ref 98–111)
Creatinine, Ser: 0.44 mg/dL — ABNORMAL LOW (ref 0.61–1.24)
GFR, Estimated: >60 mL/min (ref >60)
Glucose, Bld: 82 mg/dL (ref 70–99)
Potassium: 3.5 mmol/L (ref 3.5–5.1)
Sodium: 128 mmol/L — ABNORMAL LOW (ref 135–145)
Total Bilirubin: 0.9 mg/dL (ref 0.3–1.2)
Total Protein: 7.3 g/dL (ref 6.5–8.1)

## 2023-07-28 LAB — ETHANOL: Alcohol, Ethyl (B): 226 mg/dL — ABNORMAL HIGH (ref <10)

## 2023-07-28 MED ORDER — SODIUM CHLORIDE 0.9 % IV BOLUS
1000.0000 mL | Freq: Once | INTRAVENOUS | Status: AC
  Administered 2023-07-28: 1000 mL via INTRAVENOUS

## 2023-07-28 MED ORDER — ONDANSETRON HCL 4 MG/2ML IJ SOLN
4.0000 mg | Freq: Once | INTRAMUSCULAR | Status: AC
  Administered 2023-07-28: 4 mg via INTRAVENOUS
  Filled 2023-07-28: qty 2

## 2023-07-28 NOTE — ED Provider Notes (Signed)
[facility] Provider Note    Event Date/Time   First MD Initiated Contact with Patient 07/28/23 [phone]     (approximate)   History   Weakness   HPI  Douglas Peterson. is a 72 y.o. male   Past medical history of alcohol use, TBI, hypertension, chronic hyponatremia, atrial fibrillation who presents emerged part with generalized weakness, soreness all over his body in the setting of alcohol use today.  He has nausea.  He denies abdominal pain.  He denies any shortness of breath, chest pain, GI or GU symptoms other than nausea.    External Medical Documents Reviewed: Multiple emergency department visits for nausea, alcohol intoxication, found to be hyponatremic requiring hospitalization with a low of 121      Physical Exam   Triage Vital Signs: ED Triage Vitals  Encounter Vitals Group     BP 07/27/23 2137 (!) 151/82     Systolic BP Percentile --      Diastolic BP Percentile --      Pulse Rate 07/27/23 2137 73     Resp 07/27/23 2137 18     Temp 07/27/23 2137 97.8 F (36.6 C)     Temp Source 07/27/23 2137 Oral     SpO2 07/27/23 2137 96 %     Weight 07/27/23 2138 130 lb 1.1 oz (59 kg)     Height 07/27/23 2138 5\' 6"  (1.676 m)     Head Circumference --      Peak Flow --      Pain Score 07/27/23 2138 0     Pain Loc --      Pain Education --      Exclude from Growth Chart --     Most recent vital signs: Vitals:   07/27/23 2137 07/28/23 0104  BP: (!) 151/82 138/72  Pulse: 73 87  Resp: 18 18  Temp: 97.8 F (36.6 C) 98.3 F (36.8 C)  SpO2: 96% 95%    General: Awake, no distress.  CV:  Good peripheral perfusion.  Resp:  Normal effort.  Abd:  No distention.  Other:  Chronically ill-appearing but in no acute distress, conversant appropriate.  Soft nontender abdomen to deep palpation.   ED Results / Procedures / Treatments   Labs (all labs ordered are listed, but only abnormal results are displayed) Labs Reviewed  CBC - Abnormal; Notable  for the following components:      Result Value   WBC 14.3 (*)    All other components within normal limits  ETHANOL - Abnormal; Notable for the following components:   Alcohol, Ethyl (B) 303 (*)    All other components within normal limits  BASIC METABOLIC PANEL - Abnormal; Notable for the following components:   Sodium 127 (*)    Chloride 89 (*)    BUN <5 (*)    Creatinine, Ser 0.35 (*)    Calcium 8.3 (*)    Anion gap 16 (*)    All other components within normal limits  TROPONIN I (HIGH SENSITIVITY)  TROPONIN I (HIGH SENSITIVITY)     I ordered and reviewed the above labs they are notable for blood cell count 14.3 and alcohol level in the 300s.  His sodium is 127 not far from his last check of 129  EKG  ED ECG REPORT I, Pilar Jarvis, the attending physician, personally viewed and interpreted this ECG.   Date: 07/28/2023  EKG Time: 2139  Rate: 70  Rhythm: sinus  Axis: nl  Intervals:none  ST&T Change: no stemi   PROCEDURES:  Critical Care performed: No  Procedures   MEDICATIONS ORDERED IN ED: Medications  sodium chloride 0.9 % bolus 1,000 mL (1,000 mLs Intravenous New Bag/Given 07/28/23 0445)  ondansetron (ZOFRAN) injection 4 mg (4 mg Intravenous Given 07/28/23 0447)    IMPRESSION / MDM / ASSESSMENT AND PLAN / ED COURSE  I reviewed the triage vital signs and the nursing notes.                                Patient's presentation is most consistent with acute presentation with potential threat to life or bodily function.  Differential diagnosis includes, but is not limited to, hyponatremia, electrolyte derangement, alcohol intoxication, pancreatitis, intra-abdominal infection or obstruction   The patient is on the cardiac monitor to evaluate for evidence of arrhythmia and/or significant heart rate changes.  MDM:    Vague complaints of generalized weakness muscle soreness in this patient who is intoxicated with alcohol level of 300.  Soft benign abdominal  exam doubt surgical abdominal pathologies like infection, obstruction.  Will give antiemetic, give normal saline bolus for hyponatremia likely in the setting of beer Potomania, hold for sobriety, discharge.       FINAL CLINICAL IMPRESSION(S) / ED DIAGNOSES   Final diagnoses:  Generalized weakness  Alcoholic intoxication without complication (HCC)  Hyponatremia     Rx / DC Orders   ED Discharge Orders     None        Note:  This document was prepared using Dragon voice recognition software and may include unintentional dictation errors.    Pilar Jarvis, MD 07/28/23 [phone]

## 2023-07-28 NOTE — ED Notes (Signed)
Per SW pt is his own [facility] now - will update in chart

## 2023-07-28 NOTE — ED Provider Notes (Signed)
Spoke to DSS and informed them of pending discharge.   Phineas Semen, MD 07/28/23 [phone]

## 2023-07-28 NOTE — ED Notes (Signed)
This RN contacting SW that is on call today, Charlynn Court, in regard to unsuccessful attempts at contacting [facility].

## 2023-07-28 NOTE — ED Triage Notes (Signed)
Pt arrives via [facility] from home for alcohol intoxication/weakness. Pt just discharged from facility this afternoon.

## 2023-07-28 NOTE — ED Notes (Signed)
[facility] SW contacted in attempt to provide pt discharge transportation

## 2023-07-28 NOTE — ED Notes (Signed)
This RN attempted to call [facility] again with phone # provided. Call went to voicemail and mailbox is full so this RN unable to leave HIPAA appropriate message.

## 2023-07-29 ENCOUNTER — Observation Stay
Admission: EM | Admit: 2023-07-29 | Discharge: 2023-08-03 | Disposition: A | Discharge status: Home / Self Care | Payer: Medicare HMO | Attending: Internal Medicine | Admitting: Internal Medicine

## 2023-07-29 DIAGNOSIS — E871 Hypo-osmolality and hyponatremia: Principal | ICD-10-CM | POA: Diagnosis present

## 2023-07-29 DIAGNOSIS — R2689 Other abnormalities of gait and mobility: Secondary | ICD-10-CM | POA: Insufficient documentation

## 2023-07-29 DIAGNOSIS — R112 Nausea with vomiting, unspecified: Principal | ICD-10-CM | POA: Insufficient documentation

## 2023-07-29 DIAGNOSIS — Z87891 Personal history of nicotine dependence: Secondary | ICD-10-CM | POA: Diagnosis not present

## 2023-07-29 DIAGNOSIS — I10 Essential (primary) hypertension: Secondary | ICD-10-CM | POA: Diagnosis not present

## 2023-07-29 DIAGNOSIS — F10929 Alcohol use, unspecified with intoxication, unspecified: Secondary | ICD-10-CM | POA: Diagnosis present

## 2023-07-29 DIAGNOSIS — R7401 Elevation of levels of liver transaminase levels: Secondary | ICD-10-CM | POA: Diagnosis not present

## 2023-07-29 DIAGNOSIS — Z79899 Other long term (current) drug therapy: Secondary | ICD-10-CM | POA: Diagnosis not present

## 2023-07-29 DIAGNOSIS — I480 Paroxysmal atrial fibrillation: Secondary | ICD-10-CM | POA: Diagnosis not present

## 2023-07-29 DIAGNOSIS — Y907 Blood alcohol level of 200-239 mg/100 ml: Secondary | ICD-10-CM | POA: Diagnosis not present

## 2023-07-29 DIAGNOSIS — R7989 Other specified abnormal findings of blood chemistry: Secondary | ICD-10-CM | POA: Diagnosis present

## 2023-07-29 DIAGNOSIS — M6281 Muscle weakness (generalized): Secondary | ICD-10-CM | POA: Diagnosis not present

## 2023-07-29 DIAGNOSIS — F1020 Alcohol dependence, uncomplicated: Secondary | ICD-10-CM | POA: Diagnosis present

## 2023-07-29 LAB — BASIC METABOLIC PANEL
Anion gap: 13 (ref 5–15)
BUN: <5 mg/dL — ABNORMAL LOW (ref 8–23)
CO2: 24 mmol/L (ref 22–32)
Calcium: 8.4 mg/dL — ABNORMAL LOW (ref 8.9–10.3)
Chloride: 88 mmol/L — ABNORMAL LOW (ref 98–111)
Creatinine, Ser: 0.42 mg/dL — ABNORMAL LOW (ref 0.61–1.24)
GFR, Estimated: >60 mL/min (ref >60)
Glucose, Bld: 85 mg/dL (ref 70–99)
Potassium: 3.9 mmol/L (ref 3.5–5.1)
Sodium: 125 mmol/L — ABNORMAL LOW (ref 135–145)

## 2023-07-29 LAB — HEPATIC FUNCTION PANEL
ALT: 45 U/L — ABNORMAL HIGH (ref 0–44)
AST: 49 U/L — ABNORMAL HIGH (ref 15–41)
Albumin: 4.3 g/dL (ref 3.5–5.0)
Alkaline Phosphatase: 96 U/L (ref 38–126)
Bilirubin, Direct: 0.2 mg/dL (ref 0.0–0.2)
Indirect Bilirubin: 0.7 mg/dL (ref 0.3–0.9)
Total Bilirubin: 0.9 mg/dL (ref 0.3–1.2)
Total Protein: 7.5 g/dL (ref 6.5–8.1)

## 2023-07-29 LAB — CBC
HCT: 41.4 % (ref 39.0–52.0)
Hemoglobin: 14.5 g/dL (ref 13.0–17.0)
MCH: 32.1 pg (ref 26.0–34.0)
MCHC: 35 g/dL (ref 30.0–36.0)
MCV: 91.6 fL (ref 80.0–100.0)
Platelets: 310 10*3/uL (ref 150–400)
RBC: 4.52 MIL/uL (ref 4.22–5.81)
RDW: 13.6 % (ref 11.5–15.5)
WBC: 9.2 10*3/uL (ref 4.0–10.5)
nRBC: 0 % (ref 0.0–0.2)

## 2023-07-29 LAB — ETHANOL: Alcohol, Ethyl (B): 225 mg/dL — ABNORMAL HIGH (ref <10)

## 2023-07-29 LAB — LIPASE, BLOOD
Lipase: 39 U/L (ref 11–51)
Lipase: 29 U/L (ref 11–51)

## 2023-07-29 MED ORDER — BACLOFEN 10 MG PO TABS
10.0000 mg | ORAL_TABLET | Freq: Three times a day (TID) | ORAL | Status: DC
  Administered 2023-07-30 – 2023-08-03 (×13): 10 mg via ORAL
  Filled 2023-07-29 (×14): qty 1

## 2023-07-29 MED ORDER — ONDANSETRON 4 MG PO TBDP
4.0000 mg | ORAL_TABLET | Freq: Once | ORAL | Status: AC
  Administered 2023-07-29: 4 mg via ORAL
  Filled 2023-07-29: qty 1

## 2023-07-29 MED ORDER — THIAMINE HCL 100 MG/ML IJ SOLN
100.0000 mg | Freq: Every day | INTRAMUSCULAR | Status: DC
  Administered 2023-07-31 – 2023-08-01 (×2): 100 mg via INTRAVENOUS
  Filled 2023-07-29 (×3): qty 2

## 2023-07-29 MED ORDER — MELATONIN 5 MG PO TABS
5.0000 mg | ORAL_TABLET | Freq: Every day | ORAL | Status: DC
  Administered 2023-07-30 – 2023-08-02 (×4): 5 mg via ORAL
  Filled 2023-07-29 (×4): qty 1

## 2023-07-29 MED ORDER — LORAZEPAM 2 MG PO TABS: 0.0000 mg | ORAL_TABLET | Freq: Four times a day (QID) | ORAL | Status: DC

## 2023-07-29 MED ORDER — ATORVASTATIN CALCIUM 10 MG PO TABS
10.0000 mg | ORAL_TABLET | Freq: Every day | ORAL | Status: DC
  Administered 2023-07-30 – 2023-08-03 (×5): 10 mg via ORAL
  Filled 2023-07-29 (×5): qty 1

## 2023-07-29 MED ORDER — SODIUM CHLORIDE 0.9 % IV BOLUS
1000.0000 mL | Freq: Once | INTRAVENOUS | Status: AC
  Administered 2023-07-29: 1000 mL via INTRAVENOUS

## 2023-07-29 MED ORDER — ONDANSETRON HCL 4 MG PO TABS
4.0000 mg | ORAL_TABLET | Freq: Four times a day (QID) | ORAL | Status: DC | PRN
  Administered 2023-07-30 – 2023-08-03 (×3): 4 mg via ORAL
  Filled 2023-07-29 (×2): qty 1

## 2023-07-29 MED ORDER — ACETAMINOPHEN 325 MG PO TABS
650.0000 mg | ORAL_TABLET | Freq: Four times a day (QID) | ORAL | Status: DC | PRN
  Administered 2023-07-30 – 2023-08-02 (×3): 650 mg via ORAL
  Filled 2023-07-29 (×3): qty 2

## 2023-07-29 MED ORDER — ACETAMINOPHEN 650 MG RE SUPP: 650.0000 mg | Freq: Four times a day (QID) | RECTAL | Status: DC | PRN

## 2023-07-29 MED ORDER — LORAZEPAM 2 MG/ML IJ SOLN: 0.0000 mg | Freq: Four times a day (QID) | INTRAMUSCULAR | Status: DC

## 2023-07-29 MED ORDER — ATENOLOL 25 MG PO TABS
25.0000 mg | ORAL_TABLET | Freq: Every day | ORAL | Status: DC
  Administered 2023-07-30 – 2023-08-03 (×5): 25 mg via ORAL
  Filled 2023-07-29 (×5): qty 1

## 2023-07-29 MED ORDER — LORAZEPAM 1 MG PO TABS
1.0000 mg | ORAL_TABLET | Freq: Once | ORAL | Status: AC
  Administered 2023-07-29: 1 mg via ORAL
  Filled 2023-07-29: qty 1

## 2023-07-29 MED ORDER — TAMSULOSIN HCL 0.4 MG PO CAPS
0.4000 mg | ORAL_CAPSULE | Freq: Every day | ORAL | Status: DC
  Administered 2023-07-30 – 2023-08-03 (×5): 0.4 mg via ORAL
  Filled 2023-07-29 (×5): qty 1

## 2023-07-29 MED ORDER — LORAZEPAM 2 MG/ML IJ SOLN: 0.0000 mg | Freq: Two times a day (BID) | INTRAMUSCULAR | Status: DC

## 2023-07-29 MED ORDER — PANTOPRAZOLE SODIUM 40 MG IV SOLR
40.0000 mg | INTRAVENOUS | Status: DC
  Administered 2023-07-29 – 2023-07-31 (×3): 40 mg via INTRAVENOUS
  Filled 2023-07-29 (×3): qty 10

## 2023-07-29 MED ORDER — ENOXAPARIN SODIUM 40 MG/0.4ML IJ SOSY
40.0000 mg | PREFILLED_SYRINGE | INTRAMUSCULAR | Status: DC
  Administered 2023-07-29 – 2023-08-02 (×5): 40 mg via SUBCUTANEOUS
  Filled 2023-07-29 (×5): qty 0.4

## 2023-07-29 MED ORDER — THIAMINE MONONITRATE 100 MG PO TABS
100.0000 mg | ORAL_TABLET | Freq: Every day | ORAL | Status: DC
  Administered 2023-07-30 – 2023-08-03 (×3): 100 mg via ORAL
  Filled 2023-07-29 (×3): qty 1

## 2023-07-29 MED ORDER — SODIUM CHLORIDE 1 G PO TABS
1.0000 g | ORAL_TABLET | Freq: Once | ORAL | Status: AC
  Administered 2023-07-29: 1 g via ORAL
  Filled 2023-07-29: qty 1

## 2023-07-29 MED ORDER — LORAZEPAM 2 MG PO TABS: 0.0000 mg | ORAL_TABLET | Freq: Two times a day (BID) | ORAL | Status: DC

## 2023-07-29 MED ORDER — ONDANSETRON HCL 4 MG/2ML IJ SOLN
4.0000 mg | Freq: Four times a day (QID) | INTRAMUSCULAR | Status: DC | PRN
  Administered 2023-08-01 (×2): 4 mg via INTRAVENOUS
  Filled 2023-07-29 (×4): qty 2

## 2023-07-29 MED ORDER — SODIUM CHLORIDE 1 G PO TABS
1.0000 g | ORAL_TABLET | Freq: Once | ORAL | Status: AC
  Administered 2023-07-30: 1 g via ORAL
  Filled 2023-07-29: qty 1

## 2023-07-29 NOTE — ED Provider Notes (Signed)
[facility] Provider Note    Event Date/Time   First MD Initiated Contact with Patient 07/28/23 2347     (approximate)   History   Chief Complaint Alcohol Intoxication   HPI  Douglas Peterson. is a 72 y.o. male with past medical history of hypertension, atrial fibrillation, and alcohol abuse who presents to the ED complaining of nausea.  Patient reports that he has been feeling nauseous today and like he is going to throw up, denies any episodes of vomiting at home.  He also denies any pain in his abdomen and has not had any diarrhea.  He does admit to daily alcohol consumption, reports drinking "a couple" of beers this evening.  He denies any drug use.     Physical Exam   Triage Vital Signs: ED Triage Vitals  Encounter Vitals Group     BP 07/28/23 2234 127/77     Systolic BP Percentile --      Diastolic BP Percentile --      Pulse Rate 07/28/23 2234 70     Resp 07/28/23 2234 16     Temp 07/28/23 2234 98.3 F (36.8 C)     Temp Source 07/28/23 2234 Oral     SpO2 07/28/23 2234 96 %     Weight 07/28/23 2223 130 lb 1.1 oz (59 kg)     Height 07/28/23 2223 5\' 6"  (1.676 m)     Head Circumference --      Peak Flow --      Pain Score 07/28/23 2223 Asleep     Pain Loc --      Pain Education --      Exclude from Growth Chart --     Most recent vital signs: Vitals:   07/29/23 0200 07/29/23 0410  BP: (!) 155/61 (!) 147/65  Pulse:  74  Resp:  18  Temp:  98 F (36.7 C)  SpO2:  97%    Constitutional: Somnolent but arousable to voice. Eyes: Conjunctivae are normal. Head: Atraumatic. Nose: No congestion/rhinnorhea. Mouth/Throat: Mucous membranes are moist.  Cardiovascular: Normal rate, regular rhythm. Grossly normal heart sounds.  2+ radial pulses bilaterally. Respiratory: Normal respiratory effort.  No retractions. Lungs CTAB. Gastrointestinal: Soft and nontender. No distention. Musculoskeletal: No lower extremity tenderness nor edema.   Neurologic:  Normal speech and language. No gross focal neurologic deficits are appreciated.    ED Results / Procedures / Treatments   Labs (all labs ordered are listed, but only abnormal results are displayed) Labs Reviewed  CBC WITH DIFFERENTIAL/PLATELET - Abnormal; Notable for the following components:      Result Value   WBC 11.0 (*)    Lymphs Abs 7.0 (*)    Basophils Absolute 0.2 (*)    All other components within normal limits  ETHANOL - Abnormal; Notable for the following components:   Alcohol, Ethyl (B) 226 (*)    All other components within normal limits  COMPREHENSIVE METABOLIC PANEL - Abnormal; Notable for the following components:   Sodium 128 (*)    Chloride 91 (*)    BUN <5 (*)    Creatinine, Ser 0.44 (*)    Calcium 8.3 (*)    AST 56 (*)    ALT 47 (*)    All other components within normal limits  LIPASE, BLOOD    PROCEDURES:  Critical Care performed: No  Procedures   MEDICATIONS ORDERED IN ED: Medications  ondansetron (ZOFRAN-ODT) disintegrating tablet 4 mg (4 mg Oral Given 07/29/23 0102)  IMPRESSION / MDM / ASSESSMENT AND PLAN / ED COURSE  I reviewed the triage vital signs and the nursing notes.                              72 y.o. male with past medical history of hypertension, atrial fibrillation, and alcohol abuse who presents to the ED complaining of nausea in the setting of daily alcohol consumption.  Patient's presentation is most consistent with acute presentation with potential threat to life or bodily function.  Differential diagnosis includes, but is not limited to, alcohol intoxication, dehydration, alcoholic ketoacidosis, gastroenteritis, gastritis, pancreatitis, hepatitis, electrolyte abnormality, AKI.  Patient chronically ill-appearing but nontoxic and in no acute distress, vital signs are unremarkable.  His abdomen is soft with no tenderness, labs show stable chronic hyponatremia but no other acute electrolyte abnormality or AKI.   He has mild transaminitis, similar to previous with no elevation in bilirubin.  No significant anemia or leukocytosis noted, we will add on lipase and treat symptomatically with ODT Zofran.  He is ethanol level is elevated, plan to reassess once he is clinically sober.  Patient now clinically sober and tolerating oral intake.  He is appropriate for discharge home, was offered home health services by social work 1 week ago but declined at that time.  He was counseled to return to the ED for new or worsening symptoms, patient agrees with plan.      FINAL CLINICAL IMPRESSION(S) / ED DIAGNOSES   Final diagnoses:  Alcoholic intoxication without complication (HCC)  Nausea     Rx / DC Orders   ED Discharge Orders     None        Note:  This document was prepared using Dragon voice recognition software and may include unintentional dictation errors.   Chesley Noon, MD 07/29/23 [phone]

## 2023-07-29 NOTE — ED Provider Notes (Signed)
[facility] Provider Note    Event Date/Time   First MD Initiated Contact with Patient 07/29/23 2059     (approximate)   History   Emesis   HPI  Douglas Radford. is a 72 year old male with history of HTN, A-fib, alcohol abuse presenting to the emergency department for evaluation of nausea.  Reports this is been ongoing for several days.  Says he is even dry heaving, no episodes of vomiting.  Reports some mild discomfort in his upper abdomen, denies pain in the remainder of his abdomen.  Does report to daily beer consumption.  Has been seen in our ER several times recently for the same.  I did review patient's discharge summary from 07/21/2023.  At that time, patient presented with hyponatremia for which patient was given sodium tablets, IV fluid bolus and admitted for further management.  Did have improvement in his sodium to the 130s which was reportedly his baseline.     Physical Exam   Triage Vital Signs: ED Triage Vitals [07/29/23 1828]  Encounter Vitals Group     BP      Systolic BP Percentile      Diastolic BP Percentile      Pulse      Resp      Temp      Temp src      SpO2      Weight 130 lb 1.1 oz (59 kg)     Height 5\' 6"  (1.676 m)     Head Circumference      Peak Flow      Pain Score 0     Pain Loc      Pain Education      Exclude from Growth Chart     Most recent vital signs: Vitals:   07/29/23 2141  BP: (!) 190/79  Pulse: 89  Resp: 18  Temp: 98 F (36.7 C)  SpO2: 96%     General: Somnolent but arousable, smells of beer on breath CV:  Regular rate, good peripheral perfusion.  Resp:  Lungs clear, unlabored respirations.  Abd:  Soft, nondistended, mild epigastric tenderness, remainder of abdomen nontender Neuro:  Symmetric facial movement, slurred speech, moving extremity spontaneously   ED Results / Procedures / Treatments   Labs (all labs ordered are listed, but only abnormal results are displayed) Labs Reviewed   BASIC METABOLIC PANEL - Abnormal; Notable for the following components:      Result Value   Sodium 125 (*)    Chloride 88 (*)    BUN <5 (*)    Creatinine, Ser 0.42 (*)    Calcium 8.4 (*)    All other components within normal limits  ETHANOL - Abnormal; Notable for the following components:   Alcohol, Ethyl (B) 225 (*)    All other components within normal limits  HEPATIC FUNCTION PANEL - Abnormal; Notable for the following components:   AST 49 (*)    ALT 45 (*)    All other components within normal limits  CBC  LIPASE, BLOOD     EKG EKG independently reviewed interpreted by myself (ER attending) demonstrates:    RADIOLOGY Imaging independently reviewed and interpreted by myself demonstrates:    PROCEDURES:  Critical Care performed: No  Procedures   MEDICATIONS ORDERED IN ED: Medications  LORazepam (ATIVAN) injection 0-4 mg (has no administration in time range)    Or  LORazepam (ATIVAN) tablet 0-4 mg (has no administration in time range)  LORazepam (ATIVAN) injection  0-4 mg (has no administration in time range)    Or  LORazepam (ATIVAN) tablet 0-4 mg (has no administration in time range)  thiamine (VITAMIN B1) tablet 100 mg (has no administration in time range)    Or  thiamine (VITAMIN B1) injection 100 mg (has no administration in time range)  atenolol (TENORMIN) tablet 25 mg (has no administration in time range)  atorvastatin (LIPITOR) tablet 10 mg (has no administration in time range)  tamsulosin (FLOMAX) capsule 0.4 mg (has no administration in time range)  melatonin tablet 5 mg (has no administration in time range)  baclofen (LIORESAL) tablet 10 mg (has no administration in time range)  pantoprazole (PROTONIX) injection 40 mg (has no administration in time range)  enoxaparin (LOVENOX) injection 40 mg (has no administration in time range)  acetaminophen (TYLENOL) tablet 650 mg (has no administration in time range)    Or  acetaminophen (TYLENOL) suppository  650 mg (has no administration in time range)  ondansetron (ZOFRAN) tablet 4 mg (has no administration in time range)    Or  ondansetron (ZOFRAN) injection 4 mg (has no administration in time range)  sodium chloride tablet 1 g (has no administration in time range)  sodium chloride 0.9 % bolus 1,000 mL (has no administration in time range)  LORazepam (ATIVAN) tablet 1 mg (1 mg Oral Given 07/29/23 2210)  sodium chloride tablet 1 g (1 g Oral Given 07/29/23 2254)     IMPRESSION / MDM / ASSESSMENT AND PLAN / ED COURSE  I reviewed the triage vital signs and the nursing notes.  Differential diagnosis includes, but is not limited to, anemia, electrolyte abnormality, nausea secondary to alcohol use, lower suspicion for significant acute intra-abdominal process given overall reassuring abdominal exam  Patient's presentation is most consistent with acute presentation with potential threat to life or bodily function.  72 year old male presenting to the emergency department for nausea in the setting of continued alcohol use.  Vital stable.  Labs notable for progressive hyponatremia with a sodium of 125, slowly decreasing over the past few days.  Per documentation from most recent discharge summary, baseline is in the 130s.  EtOH elevated at 225.  With poor intake and worsening hyponatremia in the setting of continued beer consumption, I am concerned about beer potomania.  Case was reviewed with Dr. Para March who did feel that observation admission was reasonable for optimization of patient's electrolytes.  She will evaluate the patient for anticipated admission.      FINAL CLINICAL IMPRESSION(S) / ED DIAGNOSES   Final diagnoses:  Hyponatremia     Rx / DC Orders   ED Discharge Orders     None        Note:  This document was prepared using Dragon voice recognition software and may include unintentional dictation errors.   Trinna Post, MD 07/29/23 [phone]

## 2023-07-29 NOTE — ED Triage Notes (Addendum)
First nurse note: pt to ED [facility] for v/d. VSS per EMS Possible ETOH

## 2023-07-29 NOTE — ED Notes (Signed)
Patient resting in bed free from sign of distress. Breathing unlabored with symmetric chest rise and fall. Bed low and locked with side rails raised x2. Call bell in reach. Pt in view of nurses station.

## 2023-07-29 NOTE — ED Notes (Signed)
Patient resting in bed free from sign of distress. Breathing unlabored speaking in full sentences with symmetric chest rise and fall. Bed low and locked with side rails raised x2. Call bell in reach  
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
## 2023-07-29 NOTE — ED Notes (Signed)
Patient resting in bed free from sign of distress. Breathing unlabored speaking in full sentences with symmetric chest rise and fall. Bed low and locked with side rails raised x2. Call bell in reach

## 2023-07-29 NOTE — ED Notes (Signed)
Patient discharged at this time. Wheeled to lobby to await cab. Breathing unlabored speaking in full sentences. Verbalized understanding of all discharge, follow up,  teaching. Discharged homed with all belongings.

## 2023-07-30 DIAGNOSIS — E871 Hypo-osmolality and hyponatremia: Secondary | ICD-10-CM | POA: Diagnosis not present

## 2023-07-30 LAB — BASIC METABOLIC PANEL
Anion gap: 11 (ref 5–15)
BUN: <5 mg/dL — ABNORMAL LOW (ref 8–23)
CO2: 24 mmol/L (ref 22–32)
Calcium: 8.2 mg/dL — ABNORMAL LOW (ref 8.9–10.3)
Chloride: 98 mmol/L (ref 98–111)
Creatinine, Ser: 0.49 mg/dL — ABNORMAL LOW (ref 0.61–1.24)
GFR, Estimated: >60 mL/min (ref >60)
Glucose, Bld: 59 mg/dL — ABNORMAL LOW (ref 70–99)
Potassium: 3.9 mmol/L (ref 3.5–5.1)
Sodium: 133 mmol/L — ABNORMAL LOW (ref 135–145)

## 2023-07-30 LAB — CBC
HCT: 40.3 % (ref 39.0–52.0)
Hemoglobin: 13.8 g/dL (ref 13.0–17.0)
MCH: 31.2 pg (ref 26.0–34.0)
MCHC: 34.2 g/dL (ref 30.0–36.0)
MCV: 91.2 fL (ref 80.0–100.0)
Platelets: 279 10*3/uL (ref 150–400)
RBC: 4.42 MIL/uL (ref 4.22–5.81)
RDW: 13.1 % (ref 11.5–15.5)
WBC: 6.3 10*3/uL (ref 4.0–10.5)
nRBC: 0.5 % — ABNORMAL HIGH (ref 0.0–0.2)

## 2023-07-30 MED ORDER — LORAZEPAM 1 MG PO TABS: 1.0000 mg | ORAL_TABLET | ORAL | Status: DC | PRN

## 2023-07-30 MED ORDER — GERHARDT'S BUTT CREAM
TOPICAL_CREAM | Freq: Two times a day (BID) | CUTANEOUS | Status: DC
  Filled 2023-07-30: qty 1

## 2023-07-30 MED ORDER — LORAZEPAM 1 MG PO TABS
1.0000 mg | ORAL_TABLET | ORAL | Status: AC | PRN
  Administered 2023-07-30 – 2023-07-31 (×3): 1 mg via ORAL
  Filled 2023-07-30 (×2): qty 1

## 2023-07-30 MED ORDER — LORAZEPAM 1 MG PO TABS
1.0000 mg | ORAL_TABLET | ORAL | Status: AC | PRN
  Filled 2023-07-30: qty 1

## 2023-07-30 MED ORDER — LORAZEPAM 2 MG/ML IJ SOLN
0.0000 mg | Freq: Four times a day (QID) | INTRAMUSCULAR | Status: DC
  Filled 2023-07-30: qty 1
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
## 2023-07-30 NOTE — ED Notes (Signed)
Breakfast at bedside, RN helping pt to eat.

## 2023-07-30 NOTE — ED Notes (Signed)
Pt given coffee and pt requesting ativan, RN told pt he was just given ativan within the hour. CWAW documented.

## 2023-07-30 NOTE — ED Notes (Signed)
Dietary called for pt meal tray.

## 2023-07-30 NOTE — Progress Notes (Signed)
[facility] Liaison note:   This patient is currently enrolled in [facility] outpatient-based palliative care.  [facility] will continue to follow for discharge disposition.    Please call for any outpatient based palliative care related questions or concerns.   [facility] Liaison [phone]

## 2023-07-30 NOTE — ED Notes (Signed)
Pt anxious at this time.

## 2023-07-30 NOTE — ED Notes (Addendum)
Pt reports having rectum pain. RN asked if pt feels he needs to have a bowel movement, pt states no he's states he is having burning. Pt has skin breakdown and redness on bilateral buttock, pt cleaned and barrier cream applied. See orders for pain intervention.

## 2023-07-30 NOTE — ED Notes (Signed)
Hospitalist messaged due to pt CWAW score being 12. See orders

## 2023-07-30 NOTE — ED Notes (Signed)
Dietary called again per pt request, no meal has arrived for pt
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
## 2023-07-30 NOTE — TOC Initial Note (Signed)
Transition of Care (TOC) - Initial/Assessment Note    Patient Details  Name: Douglas Peterson. MRN: [id number] Date of Birth: Nov 12, 1950  Transition of Care [facility]) CM/SW Contact:    Douglas Palms, LCSW Phone Number: 07/30/2023, 4:29 PM  Clinical Narrative:                  CSW met with patient at bedside. Patient reports that he does want some help with his ETOH. Patient reported that it is ok to give Douglas Peterson his number to contact him. Douglas Peterson contacted. Awaiting response.       Patient Goals and CMS Choice            Expected Discharge Plan and Services                                              Prior Living Arrangements/Services                       Activities of Daily Living      Permission Sought/Granted                  Emotional Assessment              Admission diagnosis:  Hyponatremia [E87.1] Patient Active Problem List   Diagnosis Date Noted   Nausea 07/19/2023   Pressure injury of skin 07/11/2023   Alcohol abuse 07/11/2023   Abdominal pain 06/16/2023   Elevated lactic acid level 06/16/2023   Chronic diastolic CHF (congestive heart failure) (HCC) 06/16/2023   Increased urinary frequency 06/08/2023   Diarrhea 06/08/2023   Upper respiratory infection 06/04/2023   Enteritis 06/02/2023   Polyuria 06/02/2023   Dyslipidemia 05/01/2023   Alcohol dependence (HCC) 05/01/2023   Maggot infestation feet 05/11/2022   Unable to care for self 05/11/2022   Debility 04/25/2022   Weakness 04/23/2022   Epistaxis 04/23/2022   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   Generalized weakness 03/18/2022   Nondisplaced fracture of shaft of left clavicle, initial encounter for closed fracture 03/18/2022   BPH (benign prostatic hyperplasia) 03/18/2022   Clavicle fracture 03/17/2022   Fall 03/17/2022   CAP (community acquired pneumonia) 03/13/2022   Altered mental status, unspecified 02/20/2022   Hypotension  02/19/2022   Dysphonia 08/13/2021   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   Paresthesia of right upper extremity 08/13/2021   SIRS (systemic inflammatory response syndrome) (HCC) 08/05/2020   GERD (gastroesophageal reflux disease) 06/05/2020   Alcohol use disorder, severe, dependence (HCC) 05/28/2020   Elevated LFTs 04/06/2020   Paroxysmal A-fib (HCC) 03/17/2020   Primary insomnia 02/06/2020   Left inguinal hernia 01/31/2020   Encounter for competency evaluation    Depression 01/17/2020   Pancytopenia (HCC)    Hypomagnesemia    Alcoholic cirrhosis of liver without ascites (HCC)    ETOH abuse    Alcohol intoxication (HCC)    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) 09/27/2019   Hypokalemia 09/27/2019   Alcohol withdrawal delirium, acute, hyperactive (HCC) 09/21/2019   Pressure injury of ankle, stage 1 08/15/2019   Palliative care by specialist    Closed fracture of right proximal humerus 03/19/2019   Vitamin D deficiency 01/11/2019   Osteoporosis 12/22/2018   Closed nondisplaced fracture of acromial process of right scapula with routine healing 11/15/2018   Compression fracture of  L2 vertebra with routine healing 11/15/2018   Delirium tremens (HCC) 03/08/2018   Essential hypertension 09/16/2017   Encephalopathy, 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   Steatohepatitis 12/03/2016   Abnormal liver enzymes 06/17/2016   Hereditary hemochromatosis (HCC) 06/17/2016   Anxiety 07/16/2015   Hyponatremia 07/09/2015   H/O traumatic brain injury 05/22/2014   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HCC) 05/22/2014   PCP:  Pcp, No Pharmacy:   [facility] [place], [place] - [address] [place] [address] Phone: [phone] Fax: [phone]  [facility] [address] [place] [address] Phone: [phone] Fax: [phone]     Social Determinants of Health (SDOH) Social History: SDOH Screenings   Food  Insecurity: No Food Insecurity (07/21/2023)  Housing: Low Risk  (07/21/2023)  Transportation Needs: No Transportation Needs (07/21/2023)  Utilities: Not At Risk (07/21/2023)  Financial Resource Strain: Unknown (06/13/2019)  Physical Activity: Unknown (06/13/2019)  Social Connections: Unknown (06/13/2019)  Stress: Unknown (06/13/2019)  Tobacco Use: Medium Risk (07/29/2023)   SDOH Interventions:     Readmission Risk Interventions    06/17/2023    4:23 PM 04/29/2022   10:26 AM 03/14/2022   12:48 PM  Readmission Risk Prevention Plan  Transportation Screening Complete Complete Complete  PCP or Specialist Appt within 3-5 Days   Complete  HRI or Home Care Consult Complete Complete Complete  Social Work Consult for Recovery Care Planning/Counseling Complete Complete Complete  Palliative Care Screening Not Applicable Not Applicable Not Applicable  Medication Review Oceanographer) Complete Complete Complete

## 2023-07-30 NOTE — ED Notes (Signed)
Purwick placed on pt due to lack of mobility on R arm. Pt ate all of his clear liquid tray and watching TV comfortably

## 2023-07-31 DIAGNOSIS — E871 Hypo-osmolality and hyponatremia: Secondary | ICD-10-CM | POA: Diagnosis not present

## 2023-07-31 LAB — CBC WITH DIFFERENTIAL/PLATELET
Abs Immature Granulocytes: 0.01 10*3/uL (ref 0.00–0.07)
Basophils Absolute: 0 10*3/uL (ref 0.0–0.1)
Basophils Relative: 1 %
Eosinophils Absolute: 0.1 10*3/uL (ref 0.0–0.5)
Eosinophils Relative: 2 %
HCT: 38.8 % — ABNORMAL LOW (ref 39.0–52.0)
Hemoglobin: 13.6 g/dL (ref 13.0–17.0)
Immature Granulocytes: 0 %
Lymphocytes Relative: 54 %
Lymphs Abs: 2.7 10*3/uL (ref 0.7–4.0)
MCH: 31.8 pg (ref 26.0–34.0)
MCHC: 35.1 g/dL (ref 30.0–36.0)
MCV: 90.7 fL (ref 80.0–100.0)
Monocytes Absolute: 0.4 10*3/uL (ref 0.1–1.0)
Monocytes Relative: 8 %
Neutro Abs: 1.8 10*3/uL (ref 1.7–7.7)
Neutrophils Relative %: 35 %
Platelets: 244 10*3/uL (ref 150–400)
RBC: 4.28 MIL/uL (ref 4.22–5.81)
RDW: 13.2 % (ref 11.5–15.5)
WBC: 5 10*3/uL (ref 4.0–10.5)
nRBC: 0.6 % — ABNORMAL HIGH (ref 0.0–0.2)

## 2023-07-31 LAB — BASIC METABOLIC PANEL
Anion gap: 11 (ref 5–15)
BUN: 5 mg/dL — ABNORMAL LOW (ref 8–23)
CO2: 25 mmol/L (ref 22–32)
Calcium: 8.5 mg/dL — ABNORMAL LOW (ref 8.9–10.3)
Chloride: 95 mmol/L — ABNORMAL LOW (ref 98–111)
Creatinine, Ser: 0.48 mg/dL — ABNORMAL LOW (ref 0.61–1.24)
GFR, Estimated: >60 mL/min (ref >60)
Glucose, Bld: 80 mg/dL (ref 70–99)
Potassium: 3.7 mmol/L (ref 3.5–5.1)
Sodium: 131 mmol/L — ABNORMAL LOW (ref 135–145)

## 2023-07-31 MED ORDER — LORAZEPAM 1 MG PO TABS: 1.0000 mg | ORAL_TABLET | ORAL | Status: DC | PRN

## 2023-07-31 MED ORDER — LORAZEPAM 2 MG/ML IJ SOLN: 0.0000 mg | Freq: Two times a day (BID) | INTRAMUSCULAR | Status: DC

## 2023-07-31 MED ORDER — LORAZEPAM 1 MG PO TABS: 1.0000 mg | ORAL_TABLET | Freq: Four times a day (QID) | ORAL | Status: AC

## 2023-07-31 MED ORDER — GERHARDT'S BUTT CREAM
TOPICAL_CREAM | Freq: Two times a day (BID) | CUTANEOUS | Status: DC
  Administered 2023-08-01: 1 via TOPICAL
  Filled 2023-07-31: qty 1

## 2023-07-31 NOTE — Progress Notes (Signed)
OT Cancellation Note  Patient Details Name: Douglas Peterson. MRN: [id number] DOB: 1951-07-27   Cancelled Treatment:    Reason Eval/Treat Not Completed: OT screened, no needs identified, will sign off. Order received, chart reviewed. Pt seen ambulating with PT in hallway, per PT completing ADLs at baseline functional independence. No skilled OT needs identified. Will sign off. Please re-consult if additional needs arise.   Kathie Dike, M.S. OTR/L  07/31/23, 12:15 PM  ascom [phone]
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
## 2023-08-01 DIAGNOSIS — E871 Hypo-osmolality and hyponatremia: Secondary | ICD-10-CM | POA: Diagnosis not present

## 2023-08-01 LAB — CBC WITH DIFFERENTIAL/PLATELET
Abs Immature Granulocytes: 0.01 10*3/uL (ref 0.00–0.07)
Basophils Absolute: 0 10*3/uL (ref 0.0–0.1)
Basophils Relative: 1 %
Eosinophils Absolute: 0.1 10*3/uL (ref 0.0–0.5)
Eosinophils Relative: 3 %
HCT: 41 % (ref 39.0–52.0)
Hemoglobin: 14.5 g/dL (ref 13.0–17.0)
Immature Granulocytes: 0 %
Lymphocytes Relative: 54 %
Lymphs Abs: 2.9 10*3/uL (ref 0.7–4.0)
MCH: 31.9 pg (ref 26.0–34.0)
MCHC: 35.4 g/dL (ref 30.0–36.0)
MCV: 90.3 fL (ref 80.0–100.0)
Monocytes Absolute: 0.4 10*3/uL (ref 0.1–1.0)
Monocytes Relative: 8 %
Neutro Abs: 1.9 10*3/uL (ref 1.7–7.7)
Neutrophils Relative %: 34 %
Platelets: 234 10*3/uL (ref 150–400)
RBC: 4.54 MIL/uL (ref 4.22–5.81)
RDW: 13.3 % (ref 11.5–15.5)
WBC: 5.4 10*3/uL (ref 4.0–10.5)
nRBC: 0 % (ref 0.0–0.2)

## 2023-08-01 LAB — BASIC METABOLIC PANEL
Anion gap: 10 (ref 5–15)
BUN: 9 mg/dL (ref 8–23)
CO2: 23 mmol/L (ref 22–32)
Calcium: 9 mg/dL (ref 8.9–10.3)
Chloride: 100 mmol/L (ref 98–111)
Creatinine, Ser: 0.49 mg/dL — ABNORMAL LOW (ref 0.61–1.24)
GFR, Estimated: >60 mL/min (ref >60)
Glucose, Bld: 104 mg/dL — ABNORMAL HIGH (ref 70–99)
Potassium: 3.5 mmol/L (ref 3.5–5.1)
Sodium: 133 mmol/L — ABNORMAL LOW (ref 135–145)

## 2023-08-01 MED ORDER — PANTOPRAZOLE SODIUM 40 MG PO TBEC
40.0000 mg | DELAYED_RELEASE_TABLET | Freq: Every day | ORAL | Status: DC
  Administered 2023-08-01 – 2023-08-02 (×2): 40 mg via ORAL
  Filled 2023-08-01 (×2): qty 1

## 2023-08-01 NOTE — Plan of Care (Signed)
  Problem: Clinical Measurements: Goal: Ability to maintain clinical measurements within normal limits will improve Outcome: Progressing   Problem: Clinical Measurements: Goal: Respiratory complications will improve Outcome: Progressing   Problem: Clinical Measurements: Goal: Cardiovascular complication will be avoided Outcome: Progressing   Problem: Elimination: Goal: Will not experience complications related to urinary retention Outcome: Progressing   Problem: Nutrition: Goal: Adequate nutrition will be maintained Outcome: Progressing   Problem: Activity: Goal: Risk for activity intolerance will decrease Outcome: Progressing

## 2023-08-01 NOTE — Progress Notes (Signed)
Mobility Specialist - Progress Note   08/01/23 0832  Mobility  Activity Ambulated with assistance in hallway;Stood at bedside;Dangled on edge of bed  Level of Assistance Contact guard assist, steadying assist  Assistive Device Cane  Distance Ambulated (ft) 320 ft  Activity Response Tolerated well  Mobility Referral Yes  $Mobility charge 1 Mobility  Mobility Specialist Start Time (ACUTE ONLY) 0803  Mobility Specialist Stop Time (ACUTE ONLY) [id number]  Mobility Specialist Time Calculation (min) (ACUTE ONLY) 18 min   Pt supine in bed on RA upon arrival. Pt completes bed mobility indep with extra time. Pt STS and ambulates 2 laps around NS CGA with no LOB but a little shaky throughout ambulation. Pt left in recliner with needs in reach and chair alarm set.   Terrilyn Saver  Mobility Specialist  08/01/23 8:36 AM

## 2023-08-01 NOTE — TOC Progression Note (Signed)
Transition of Care (TOC) - Progression Note    Patient Details  Name: Douglas Peterson. MRN: [id number] Date of Birth: [birth year]  [facility] CM/SW Contact  Hetty Ely, RN Phone Number: 08/01/2023, 12:50 PM  Clinical Narrative: CM attempted to call contact numbers on file to discuss discharge plan per MD, possibly today, 08/01/23.No answers left voices messages to return call. CM spoke with patient who reports he's not feeling well at all, lives alone and unable to care for himself. CM attempted to call [place], [phone], voice message closed on weekends.                            Expected Discharge Plan and Services         Expected Discharge Date: 08/01/23                                     Social Determinants of Health (SDOH) Interventions SDOH Screenings   Food Insecurity: No Food Insecurity (07/21/2023)  Housing: Low Risk  (07/21/2023)  Transportation Needs: No Transportation Needs (07/21/2023)  Utilities: Not At Risk (07/21/2023)  Financial Resource Strain: Unknown (06/13/2019)  Physical Activity: Unknown (06/13/2019)  Social Connections: Unknown (06/13/2019)  Stress: Unknown (06/13/2019)  Tobacco Use: Medium Risk (07/29/2023)    Readmission Risk Interventions    06/17/2023    4:23 PM 04/29/2022   10:26 AM 03/14/2022   12:48 PM  Readmission Risk Prevention Plan  Transportation Screening Complete Complete Complete  PCP or Specialist Appt within 3-5 Days   Complete  HRI or Home Care Consult Complete Complete Complete  Social Work Consult for Recovery Care Planning/Counseling Complete Complete Complete  Palliative Care Screening Not Applicable Not Applicable Not Applicable  Medication Review Oceanographer) Complete Complete Complete
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
## 2023-08-01 NOTE — Plan of Care (Signed)
  Problem: Education: Goal: Knowledge of General Education information will improve Description: Including pain rating scale, medication(s)/side effects and non-pharmacologic comfort measures Outcome: Progressing   Problem: Clinical Measurements: Goal: Ability to maintain clinical measurements within normal limits will improve Outcome: Progressing Goal: Will remain free from infection Outcome: Progressing Goal: Diagnostic test results will improve Outcome: Progressing   Problem: Activity: Goal: Risk for activity intolerance will decrease Outcome: Progressing   Problem: Nutrition: Goal: Adequate nutrition will be maintained Outcome: Progressing   Problem: Coping: Goal: Level of anxiety will decrease Outcome: Progressing   Problem: Safety: Goal: Ability to remain free from injury will improve Outcome: Progressing   Problem: Skin Integrity: Goal: Risk for impaired skin integrity will decrease Outcome: Progressing

## 2023-08-01 NOTE — Plan of Care (Signed)

## 2023-08-01 NOTE — Progress Notes (Signed)
PHARMACIST - PHYSICIAN COMMUNICATION  CONCERNING: IV to Oral Route Change Policy  RECOMMENDATION: This patient is receiving pantoprazole by the intravenous route.  Based on criteria approved by the Pharmacy and Therapeutics Committee, the intravenous medication(s) is/are being converted to the equivalent oral dose form(s).  DESCRIPTION: These criteria include: The patient is eating (either orally or via tube) and/or has been taking other orally administered medications for a least 24 hours The patient has no evidence of active gastrointestinal bleeding or impaired GI absorption (gastrectomy, short bowel, patient on TNA or NPO).  If you have questions about this conversion, please contact the Pharmacy Department   Tressie Ellis, [facility] 08/01/2023 9:28 AM

## 2023-08-02 DIAGNOSIS — E871 Hypo-osmolality and hyponatremia: Secondary | ICD-10-CM | POA: Diagnosis not present

## 2023-08-02 LAB — CBC WITH DIFFERENTIAL/PLATELET
Abs Immature Granulocytes: 0.02 10*3/uL (ref 0.00–0.07)
Basophils Absolute: 0 10*3/uL (ref 0.0–0.1)
Basophils Relative: 1 %
Eosinophils Absolute: 0.1 10*3/uL (ref 0.0–0.5)
Eosinophils Relative: 2 %
HCT: 40.1 % (ref 39.0–52.0)
Hemoglobin: 14 g/dL (ref 13.0–17.0)
Immature Granulocytes: 0 %
Lymphocytes Relative: 56 %
Lymphs Abs: 3.4 10*3/uL (ref 0.7–4.0)
MCH: 31.7 pg (ref 26.0–34.0)
MCHC: 34.9 g/dL (ref 30.0–36.0)
MCV: 90.9 fL (ref 80.0–100.0)
Monocytes Absolute: 0.5 10*3/uL (ref 0.1–1.0)
Monocytes Relative: 7 %
Neutro Abs: 2.1 10*3/uL (ref 1.7–7.7)
Neutrophils Relative %: 34 %
Platelets: 218 10*3/uL (ref 150–400)
RBC: 4.41 MIL/uL (ref 4.22–5.81)
RDW: 13.3 % (ref 11.5–15.5)
WBC: 6.2 10*3/uL (ref 4.0–10.5)
nRBC: 0 % (ref 0.0–0.2)

## 2023-08-02 LAB — BASIC METABOLIC PANEL
Anion gap: 9 (ref 5–15)
BUN: 9 mg/dL (ref 8–23)
CO2: 24 mmol/L (ref 22–32)
Calcium: 8.7 mg/dL — ABNORMAL LOW (ref 8.9–10.3)
Chloride: 100 mmol/L (ref 98–111)
Creatinine, Ser: 0.48 mg/dL — ABNORMAL LOW (ref 0.61–1.24)
GFR, Estimated: >60 mL/min (ref >60)
Glucose, Bld: 88 mg/dL (ref 70–99)
Potassium: 3.7 mmol/L (ref 3.5–5.1)
Sodium: 133 mmol/L — ABNORMAL LOW (ref 135–145)

## 2023-08-02 MED ORDER — ONDANSETRON HCL 4 MG PO TABS: 4.0000 mg | ORAL_TABLET | Freq: Four times a day (QID) | ORAL | 0 refills | Status: AC | PRN

## 2023-08-02 NOTE — Progress Notes (Signed)
Patient still refused to leave the hospital several attempt made talking to him, Press photographer, MD, social worker and [facility] are aware of the situation, security spoke with the patient but still insist the he will leave tom.

## 2023-08-02 NOTE — Progress Notes (Signed)
Mobility Specialist - Progress Note   08/02/23 1239  Mobility  Activity Ambulated with assistance in hallway;Stood at bedside;Dangled on edge of bed  Level of Assistance Standby assist, set-up cues, supervision of patient - no hands on  Assistive Device Cane  Distance Ambulated (ft) 1000 ft  Activity Response Tolerated well  Mobility Referral Yes  $Mobility charge 1 Mobility  Mobility Specialist Start Time (ACUTE ONLY) [id number]  Mobility Specialist Stop Time (ACUTE ONLY) 1024  Mobility Specialist Time Calculation (min) (ACUTE ONLY) 26 min   Pt OOB in bathroom on RA upon arrival. Pt STS and ambulates to/from medical mall SBA with no LOB noted or physical assistance needed. Pt left in recliner with needs and reach.  Mobility Specialist  08/02/23 12:40 PM

## 2023-08-02 NOTE — Plan of Care (Signed)
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
## 2023-08-02 NOTE — Progress Notes (Signed)
Patient does not want me to call her daughter, discussed discharged paper but patient do not want to home today because he's still sick, he wants to leave hospital tomorrow morning, charged nurse and MD made aware.

## 2023-08-02 NOTE — TOC Progression Note (Addendum)
Transition of Care (TOC) - Progression Note    Patient Details  Name: [name] Zylstra. MRN: [id number] Date of Birth: 04-Jan-1951  Transition of Care [facility]) CM/SW Contact  Dannielle Karvonen Phone Number: 08/02/2023, 4:26 PM  Clinical Narrative:     Cab voucher offered to pt, pt refusing to leave, security at bedside, pt continue to refuse.   CSW spoke with Kandee Keen at [employer], confirmed they can offer HH PT when pt dc. TOC will follow up with [facility] when pt is ready.        Expected Discharge Plan and Services         Expected Discharge Date: 08/02/23                                     Social Determinants of Health (SDOH) Interventions SDOH Screenings   Food Insecurity: No Food Insecurity (07/21/2023)  Housing: Low Risk  (07/21/2023)  Transportation Needs: No Transportation Needs (07/21/2023)  Utilities: Not At Risk (07/21/2023)  Financial Resource Strain: Unknown (06/13/2019)  Physical Activity: Unknown (06/13/2019)  Social Connections: Unknown (06/13/2019)  Stress: Unknown (06/13/2019)  Tobacco Use: Medium Risk (07/29/2023)    Readmission Risk Interventions    06/17/2023    4:23 PM 04/29/2022   10:26 AM 03/14/2022   12:48 PM  Readmission Risk Prevention Plan  Transportation Screening Complete Complete Complete  PCP or Specialist Appt within 3-5 Days   Complete  HRI or Home Care Consult Complete Complete Complete  Social Work Consult for Recovery Care Planning/Counseling Complete Complete Complete  Palliative Care Screening Not Applicable Not Applicable Not Applicable  Medication Review Oceanographer) Complete Complete Complete

## 2023-08-03 DIAGNOSIS — E871 Hypo-osmolality and hyponatremia: Secondary | ICD-10-CM | POA: Diagnosis not present

## 2023-08-03 NOTE — TOC Progression Note (Signed)
Transition of Care (TOC) - Progression Note    Patient Details  Name: Douglas Peterson. MRN: [id number] Date of Birth: Sep 02, 1951  Transition of Care [facility]) CM/SW Contact  Truddie Hidden, RN Phone Number: 08/03/2023, 2:00 PM  Clinical Narrative:    Spoke with patient at the bedside regarding his discharge and transportation home. Patient kept his eyes closed while RNCM was speaking with him regarding discharge although not sleeping.Patient stated he is unable to discharge home "I'm sick." When asked what was wrong he did not answer. RNCM advised he had discharge orders for today. He did not answer questions about transportation. RNCM inquired about calling his daughter for transportation. When dialing the phone number patient's eyes opened. He stated "Do not call her." RNCM advised a taxi would be provided for him. He closed his eyes afterward. Patient again stated he was sick and he proceeded to put his face in an emesis bag. No fluids were observed being expelled. Charge nurse advised patient stated he was ill.         Expected Discharge Plan and Services         Expected Discharge Date: 08/02/23                         HH Arranged: PT HH Agency: [facility] Date HH Agency Contacted: 08/02/23 Time HH Agency Contacted: 1631 Representative spoke with at [facility] Agency: Kandee Keen   Social Determinants of Health (SDOH) Interventions SDOH Screenings   Food Insecurity: No Food Insecurity (07/21/2023)  Housing: Low Risk  (07/21/2023)  Transportation Needs: No Transportation Needs (07/21/2023)  Utilities: Not At Risk (07/21/2023)  Financial Resource Strain: Unknown (06/13/2019)  Physical Activity: Unknown (06/13/2019)  Social Connections: Unknown (06/13/2019)  Stress: Unknown (06/13/2019)  Tobacco Use: Medium Risk (07/29/2023)    Readmission Risk Interventions    06/17/2023    4:23 PM 04/29/2022   10:26 AM 03/14/2022   12:48 PM  Readmission Risk Prevention Plan  Transportation  Screening Complete Complete Complete  PCP or Specialist Appt within 3-5 Days   Complete  HRI or Home Care Consult Complete Complete Complete  Social Work Consult for Recovery Care Planning/Counseling Complete Complete Complete  Palliative Care Screening Not Applicable Not Applicable Not Applicable  Medication Review Oceanographer) Complete Complete Complete

## 2023-08-03 NOTE — Progress Notes (Signed)
Mobility Specialist - Progress Note   08/03/23 0828  Mobility  Activity Ambulated with assistance in hallway;Stood at bedside;Dangled on edge of bed  Level of Assistance Standby assist, set-up cues, supervision of patient - no hands on  Assistive Device Cane  Distance Ambulated (ft) 1000 ft  Activity Response Tolerated well  Mobility Referral Yes  $Mobility charge 1 Mobility  Mobility Specialist Start Time (ACUTE ONLY) [id number]  Mobility Specialist Stop Time (ACUTE ONLY) [id number]  Mobility Specialist Time Calculation (min) (ACUTE ONLY) 29 min   Pt supine in bed on RA upon arrival. Pt completes bed mobility HHA with extra time. Pt STS and ambulates to/from Medical Mall SBA with no physical assistance needed. Pt left in recliner with needs in reach.   Terrilyn Saver  Mobility Specialist  08/03/23 8:30 AM

## 2023-08-03 NOTE — Care Management Obs Status (Signed)
MEDICARE OBSERVATION STATUS NOTIFICATION   Patient Details  Name: Douglas Peterson. MRN: [id number] Date of Birth: [birth year]   Medicare Observation Status Notification Given:  Yes    Truddie Hidden, RN 08/03/2023, 2:13 PM

## 2023-08-03 NOTE — Progress Notes (Signed)
[employer] to take patient home.  Helped patient get dressed and gave him back his belongings including cell phone and charger and money clip.  Escorted out of hospital via wheelchair by nursing staff.
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
## 2023-08-04 ENCOUNTER — Emergency Department: Payer: Medicare HMO

## 2023-08-04 ENCOUNTER — Other Ambulatory Visit: Payer: Self-pay

## 2023-08-04 ENCOUNTER — Emergency Department
Admission: EM | Admit: 2023-08-04 | Discharge: 2023-08-04 | Disposition: A | Discharge status: Home / Self Care | Payer: Medicare HMO | Source: Home / Self Care | Attending: Emergency Medicine | Admitting: Emergency Medicine

## 2023-08-04 DIAGNOSIS — Z20822 Contact with and (suspected) exposure to covid-19: Secondary | ICD-10-CM | POA: Insufficient documentation

## 2023-08-04 DIAGNOSIS — R0602 Shortness of breath: Secondary | ICD-10-CM | POA: Insufficient documentation

## 2023-08-04 DIAGNOSIS — F10129 Alcohol abuse with intoxication, unspecified: Secondary | ICD-10-CM | POA: Insufficient documentation

## 2023-08-04 DIAGNOSIS — F10220 Alcohol dependence with intoxication, uncomplicated: Secondary | ICD-10-CM | POA: Insufficient documentation

## 2023-08-04 DIAGNOSIS — I10 Essential (primary) hypertension: Secondary | ICD-10-CM | POA: Insufficient documentation

## 2023-08-04 DIAGNOSIS — F10920 Alcohol use, unspecified with intoxication, uncomplicated: Secondary | ICD-10-CM

## 2023-08-04 DIAGNOSIS — R059 Cough, unspecified: Secondary | ICD-10-CM | POA: Insufficient documentation

## 2023-08-04 DIAGNOSIS — E871 Hypo-osmolality and hyponatremia: Secondary | ICD-10-CM | POA: Insufficient documentation

## 2023-08-04 DIAGNOSIS — R110 Nausea: Secondary | ICD-10-CM | POA: Insufficient documentation

## 2023-08-04 DIAGNOSIS — Y907 Blood alcohol level of 200-239 mg/100 ml: Secondary | ICD-10-CM | POA: Insufficient documentation

## 2023-08-04 LAB — RESP PANEL BY RT-PCR (RSV, FLU A&B, COVID)  RVPGX2
Influenza A by PCR: NEGATIVE
Influenza B by PCR: NEGATIVE
Resp Syncytial Virus by PCR: NEGATIVE
SARS Coronavirus 2 by RT PCR: NEGATIVE

## 2023-08-04 LAB — ETHANOL: Alcohol, Ethyl (B): 227 mg/dL — ABNORMAL HIGH (ref <10)

## 2023-08-04 LAB — CBC WITH DIFFERENTIAL/PLATELET
Abs Immature Granulocytes: 0.01 10*3/uL (ref 0.00–0.07)
Basophils Absolute: 0 10*3/uL (ref 0.0–0.1)
Basophils Relative: 1 %
Eosinophils Absolute: 0.2 10*3/uL (ref 0.0–0.5)
Eosinophils Relative: 2 %
HCT: 42.7 % (ref 39.0–52.0)
Hemoglobin: 14.4 g/dL (ref 13.0–17.0)
Immature Granulocytes: 0 %
Lymphocytes Relative: 67 %
Lymphs Abs: 5.6 10*3/uL — ABNORMAL HIGH (ref 0.7–4.0)
MCH: 31.6 pg (ref 26.0–34.0)
MCHC: 33.7 g/dL (ref 30.0–36.0)
MCV: 93.6 fL (ref 80.0–100.0)
Monocytes Absolute: 0.5 10*3/uL (ref 0.1–1.0)
Monocytes Relative: 6 %
Neutro Abs: 2 10*3/uL (ref 1.7–7.7)
Neutrophils Relative %: 24 %
Platelets: 178 10*3/uL (ref 150–400)
RBC: 4.56 MIL/uL (ref 4.22–5.81)
RDW: 13.5 % (ref 11.5–15.5)
WBC: 8.3 10*3/uL (ref 4.0–10.5)
nRBC: 0 % (ref 0.0–0.2)

## 2023-08-04 LAB — URINALYSIS, ROUTINE W REFLEX MICROSCOPIC
Bilirubin Urine: NEGATIVE
Glucose, UA: NEGATIVE mg/dL
Hgb urine dipstick: NEGATIVE
Ketones, ur: NEGATIVE mg/dL
Leukocytes,Ua: NEGATIVE
Nitrite: NEGATIVE
Protein, ur: NEGATIVE mg/dL
Specific Gravity, Urine: 1.018 (ref 1.005–1.030)
pH: 5 (ref 5.0–8.0)

## 2023-08-04 LAB — TROPONIN I (HIGH SENSITIVITY)
Troponin I (High Sensitivity): 4 ng/L (ref <18)
Troponin I (High Sensitivity): <2 ng/L (ref <18)

## 2023-08-04 LAB — COMPREHENSIVE METABOLIC PANEL
ALT: 41 U/L (ref 0–44)
AST: 44 U/L — ABNORMAL HIGH (ref 15–41)
Albumin: 4.6 g/dL (ref 3.5–5.0)
Alkaline Phosphatase: 69 U/L (ref 38–126)
Anion gap: 14 (ref 5–15)
BUN: 7 mg/dL — ABNORMAL LOW (ref 8–23)
CO2: 23 mmol/L (ref 22–32)
Calcium: 8.8 mg/dL — ABNORMAL LOW (ref 8.9–10.3)
Chloride: 91 mmol/L — ABNORMAL LOW (ref 98–111)
Creatinine, Ser: 0.56 mg/dL — ABNORMAL LOW (ref 0.61–1.24)
GFR, Estimated: >60 mL/min (ref >60)
Glucose, Bld: 94 mg/dL (ref 70–99)
Potassium: 3.6 mmol/L (ref 3.5–5.1)
Sodium: 128 mmol/L — ABNORMAL LOW (ref 135–145)
Total Bilirubin: 1.1 mg/dL (ref 0.3–1.2)
Total Protein: 8.1 g/dL (ref 6.5–8.1)

## 2023-08-04 LAB — MAGNESIUM: Magnesium: 1.9 mg/dL (ref 1.7–2.4)

## 2023-08-04 LAB — BRAIN NATRIURETIC PEPTIDE: B Natriuretic Peptide: 22 pg/mL (ref 0.0–100.0)

## 2023-08-04 MED ORDER — SODIUM CHLORIDE 0.9 % IV BOLUS (SEPSIS)
1000.0000 mL | Freq: Once | INTRAVENOUS | Status: AC
  Administered 2023-08-04: 1000 mL via INTRAVENOUS

## 2023-08-04 NOTE — ED Notes (Signed)
Pt discharged, Pt sitting in lobby to call taxi service. Could not provide [employer] via Consulting civil [employer]. Provided pt with warm blanket and numbers to call a taxi service on his cell phone. All patient items/belongings returned to patient in patient belonging bag along with his AVS.

## 2023-08-04 NOTE — ED Notes (Signed)
ED Provider at bedside. 
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
## 2023-08-04 NOTE — ED Triage Notes (Signed)
Pt to ED via EMS from home, pt reports shortness of breath that has continued over the past few days. Pt has been seen multiple times for same. Pt denies any new complaints. Pt reports drinking unknown amount of wine tonight.

## 2023-08-04 NOTE — ED Notes (Signed)
Pt is alert and answering Rns questions and following commands.
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
## 2023-08-04 NOTE — ED Notes (Signed)
Pt sleeping. Unlabored respirations noted.

## 2023-08-05 ENCOUNTER — Emergency Department
Admission: EM | Admit: 2023-08-05 | Discharge: 2023-08-05 | Disposition: A | Discharge status: Home / Self Care | Payer: Medicare HMO | Source: Home / Self Care | Attending: Emergency Medicine | Admitting: Emergency Medicine

## 2023-08-05 ENCOUNTER — Emergency Department: Payer: Medicare HMO

## 2023-08-05 ENCOUNTER — Other Ambulatory Visit: Payer: Self-pay

## 2023-08-05 DIAGNOSIS — F10120 Alcohol abuse with intoxication, uncomplicated: Secondary | ICD-10-CM | POA: Diagnosis present

## 2023-08-05 DIAGNOSIS — E785 Hyperlipidemia, unspecified: Secondary | ICD-10-CM | POA: Diagnosis present

## 2023-08-05 DIAGNOSIS — F10920 Alcohol use, unspecified with intoxication, uncomplicated: Secondary | ICD-10-CM

## 2023-08-05 DIAGNOSIS — Z87891 Personal history of nicotine dependence: Secondary | ICD-10-CM

## 2023-08-05 DIAGNOSIS — K219 Gastro-esophageal reflux disease without esophagitis: Secondary | ICD-10-CM | POA: Diagnosis present

## 2023-08-05 DIAGNOSIS — E871 Hypo-osmolality and hyponatremia: Principal | ICD-10-CM | POA: Diagnosis present

## 2023-08-05 DIAGNOSIS — Z87820 Personal history of traumatic brain injury: Secondary | ICD-10-CM

## 2023-08-05 DIAGNOSIS — Z8249 Family history of ischemic heart disease and other diseases of the circulatory system: Secondary | ICD-10-CM

## 2023-08-05 DIAGNOSIS — Y907 Blood alcohol level of 200-239 mg/100 ml: Secondary | ICD-10-CM | POA: Diagnosis present

## 2023-08-05 DIAGNOSIS — I480 Paroxysmal atrial fibrillation: Secondary | ICD-10-CM | POA: Diagnosis present

## 2023-08-05 DIAGNOSIS — G928 Other toxic encephalopathy: Secondary | ICD-10-CM | POA: Diagnosis present

## 2023-08-05 DIAGNOSIS — Z79899 Other long term (current) drug therapy: Secondary | ICD-10-CM

## 2023-08-05 DIAGNOSIS — Z1152 Encounter for screening for COVID-19: Secondary | ICD-10-CM

## 2023-08-05 DIAGNOSIS — G8191 Hemiplegia, unspecified affecting right dominant side: Secondary | ICD-10-CM | POA: Diagnosis present

## 2023-08-05 DIAGNOSIS — N400 Enlarged prostate without lower urinary tract symptoms: Secondary | ICD-10-CM | POA: Diagnosis present

## 2023-08-05 DIAGNOSIS — I10 Essential (primary) hypertension: Secondary | ICD-10-CM | POA: Diagnosis present

## 2023-08-05 DIAGNOSIS — E878 Other disorders of electrolyte and fluid balance, not elsewhere classified: Secondary | ICD-10-CM | POA: Diagnosis present

## 2023-08-05 LAB — CBG MONITORING, ED: Glucose-Capillary: 99 mg/dL (ref 70–99)

## 2023-08-05 MED ORDER — ONDANSETRON 4 MG PO TBDP
4.0000 mg | ORAL_TABLET | Freq: Once | ORAL | Status: AC
  Administered 2023-08-05: 4 mg via ORAL
  Filled 2023-08-05: qty 1

## 2023-08-05 NOTE — ED Provider Notes (Signed)
[facility] Provider Note    Event Date/Time   First MD Initiated Contact with Patient 08/05/23 0011     (approximate)   History   Alcohol Intoxication   HPI  Douglas Peterson. is a 72 y.o. male   Past medical history of alcohol use, hyponatremia, who presents to the emergency department with alcohol intoxication.  Drank beer today.  In triage complaint of cough, however with me he only complains of nausea.  No other acute medical complaints.   External Medical Documents Reviewed: Emergency department visit dated yesterday for alcohol intoxication, reviewed labs which showed chronic hyponatremia, normal cell counts, negative viral testing, urinalysis, CT head and chest x-ray.      Physical Exam   Triage Vital Signs: ED Triage Vitals  Encounter Vitals Group     BP 08/04/23 2300 (!) 154/78     Systolic BP Percentile --      Diastolic BP Percentile --      Pulse Rate 08/04/23 2300 75     Resp 08/04/23 2300 20     Temp 08/04/23 2300 98.7 F (37.1 C)     Temp Source 08/04/23 2300 Oral     SpO2 08/04/23 2300 92 %     Weight 08/04/23 2300 139 lb 15.9 oz (63.5 kg)     Height 08/04/23 2300 5\' 6"  (1.676 m)     Head Circumference --      Peak Flow --      Pain Score 08/04/23 2300 0     Pain Loc --      Pain Education --      Exclude from Growth Chart --     Most recent vital signs: Vitals:   08/04/23 2300  BP: (!) 154/78  Pulse: 75  Resp: 20  Temp: 98.7 F (37.1 C)  SpO2: 92%    General: Awake, no distress.  CV:  Good peripheral perfusion.  Resp:  Normal effort.  Abd:  No distention.  Other:  Chronically ill-appearing with normal vital signs, no hypoxemia, no fever, and clear lungs without focality or wheezing.  Soft nontender abdomen to deep palpation all quadrants.   ED Results / Procedures / Treatments   Labs (all labs ordered are listed, but only abnormal results are displayed) Labs Reviewed  CBG MONITORING, ED     I  ordered and reviewed the above labs they are notable for normal blood sugar.    RADIOLOGY I independently reviewed and interpreted chest x-ray and I see no obvious focalities or pneumothorax I also reviewed radiologist's formal read.   PROCEDURES:  Critical Care performed: No  Procedures   MEDICATIONS ORDERED IN ED: Medications  ondansetron (ZOFRAN-ODT) disintegrating tablet 4 mg (4 mg Oral Given 08/05/23 0059)    IMPRESSION / MDM / ASSESSMENT AND PLAN / ED COURSE  I reviewed the triage vital signs and the nursing notes.                                Patient's presentation is most consistent with acute presentation with potential threat to life or bodily function.  Differential diagnosis includes, but is not limited to, alcohol intoxication, aspiration pneumonia, chronic hyponatremia   The patient is on the cardiac monitor to evaluate for evidence of arrhythmia and/or significant heart rate changes.  MDM:    Patient with alcohol intoxication tonight, to me only complains of nausea without vomiting.  Given Zofran for  nausea.  Complained of cough out in triage but none currently to me, clear lungs, chest x-ray without focal opacities, so I considered bacterial pneumonia but given unremarkable workup as above, no respiratory distress, no hypoxemia I doubt at this time.  For his nausea, consider pancreatitis or other intra-abdominal pathologies like obstruction or infection but given a benign abdominal exam I doubt.  Lab testing performed just 24 hours ago with chronic hyponatremia will defer further lab testing and hold for sobriety prior to discharge today.      FINAL CLINICAL IMPRESSION(S) / ED DIAGNOSES   Final diagnoses:  Alcoholic intoxication without complication (HCC)     Rx / DC Orders   ED Discharge Orders     None        Note:  This document was prepared using Dragon voice recognition software and may include unintentional dictation errors.    Pilar Jarvis, MD 08/05/23 [phone]

## 2023-08-05 NOTE — ED Notes (Signed)
Pt to ED via  EMS per ems abd pain and n/v. VSS.

## 2023-08-05 NOTE — ED Notes (Signed)
Patient cleaned of urinary incontinence and new bed changed. Xray shot.

## 2023-08-05 NOTE — ED Triage Notes (Signed)
Pt to ED via EMS from home, pt c/o abd pain and sob xfew days. Pt was seen here for same yesterday. Pt is intoxicated.

## 2023-08-05 NOTE — ED Notes (Signed)
Patient given discharge instructions including importance of follow up appt as needed with stated understanding and wheeled out to lobby until he can call himself a ride. Patient provided with paper scrubs and is upset with this RN about making him wait in the lobby. Charge RN aware.

## 2023-08-06 ENCOUNTER — Inpatient Hospital Stay
Admission: EM | Admit: 2023-08-06 | Discharge: 2023-08-11 | DRG: 640 | Disposition: A | Discharge status: Home Health | Payer: Medicare HMO | Attending: Osteopathic Medicine | Admitting: Osteopathic Medicine

## 2023-08-06 DIAGNOSIS — E878 Other disorders of electrolyte and fluid balance, not elsewhere classified: Secondary | ICD-10-CM | POA: Diagnosis present

## 2023-08-06 DIAGNOSIS — R110 Nausea: Principal | ICD-10-CM

## 2023-08-06 DIAGNOSIS — F10920 Alcohol use, unspecified with intoxication, uncomplicated: Secondary | ICD-10-CM | POA: Diagnosis not present

## 2023-08-06 DIAGNOSIS — G928 Other toxic encephalopathy: Secondary | ICD-10-CM | POA: Diagnosis present

## 2023-08-06 DIAGNOSIS — I480 Paroxysmal atrial fibrillation: Secondary | ICD-10-CM | POA: Diagnosis present

## 2023-08-06 DIAGNOSIS — E785 Hyperlipidemia, unspecified: Secondary | ICD-10-CM | POA: Diagnosis present

## 2023-08-06 DIAGNOSIS — N400 Enlarged prostate without lower urinary tract symptoms: Secondary | ICD-10-CM | POA: Diagnosis present

## 2023-08-06 DIAGNOSIS — E871 Hypo-osmolality and hyponatremia: Secondary | ICD-10-CM | POA: Diagnosis present

## 2023-08-06 DIAGNOSIS — K219 Gastro-esophageal reflux disease without esophagitis: Secondary | ICD-10-CM | POA: Insufficient documentation

## 2023-08-06 DIAGNOSIS — Y907 Blood alcohol level of 200-239 mg/100 ml: Secondary | ICD-10-CM | POA: Diagnosis present

## 2023-08-06 DIAGNOSIS — I10 Essential (primary) hypertension: Secondary | ICD-10-CM | POA: Diagnosis present

## 2023-08-06 DIAGNOSIS — Z8249 Family history of ischemic heart disease and other diseases of the circulatory system: Secondary | ICD-10-CM | POA: Diagnosis not present

## 2023-08-06 DIAGNOSIS — F10120 Alcohol abuse with intoxication, uncomplicated: Secondary | ICD-10-CM | POA: Diagnosis present

## 2023-08-06 DIAGNOSIS — Z79899 Other long term (current) drug therapy: Secondary | ICD-10-CM | POA: Diagnosis not present

## 2023-08-06 DIAGNOSIS — Z87891 Personal history of nicotine dependence: Secondary | ICD-10-CM | POA: Diagnosis not present

## 2023-08-06 DIAGNOSIS — K2100 Gastro-esophageal reflux disease with esophagitis, without bleeding: Secondary | ICD-10-CM | POA: Insufficient documentation

## 2023-08-06 DIAGNOSIS — F10929 Alcohol use, unspecified with intoxication, unspecified: Secondary | ICD-10-CM | POA: Diagnosis present

## 2023-08-06 DIAGNOSIS — G8191 Hemiplegia, unspecified affecting right dominant side: Secondary | ICD-10-CM | POA: Diagnosis present

## 2023-08-06 DIAGNOSIS — Z1152 Encounter for screening for COVID-19: Secondary | ICD-10-CM | POA: Diagnosis not present

## 2023-08-06 DIAGNOSIS — Z87820 Personal history of traumatic brain injury: Secondary | ICD-10-CM | POA: Diagnosis not present

## 2023-08-06 LAB — CBC WITH DIFFERENTIAL/PLATELET
Abs Immature Granulocytes: 0.03 10*3/uL (ref 0.00–0.07)
Basophils Absolute: 0.1 10*3/uL (ref 0.0–0.1)
Basophils Relative: 0 %
Eosinophils Absolute: 0.3 10*3/uL (ref 0.0–0.5)
Eosinophils Relative: 2 %
HCT: 37.8 % — ABNORMAL LOW (ref 39.0–52.0)
Hemoglobin: 13.6 g/dL (ref 13.0–17.0)
Immature Granulocytes: 0 %
Lymphocytes Relative: 56 %
Lymphs Abs: 7 10*3/uL — ABNORMAL HIGH (ref 0.7–4.0)
MCH: 31.6 pg (ref 26.0–34.0)
MCHC: 36 g/dL (ref 30.0–36.0)
MCV: 87.7 fL (ref 80.0–100.0)
Monocytes Absolute: 0.8 10*3/uL (ref 0.1–1.0)
Monocytes Relative: 6 %
Neutro Abs: 4.5 10*3/uL (ref 1.7–7.7)
Neutrophils Relative %: 36 %
Platelets: 165 10*3/uL (ref 150–400)
RBC: 4.31 MIL/uL (ref 4.22–5.81)
RDW: 12.9 % (ref 11.5–15.5)
WBC: 12.7 10*3/uL — ABNORMAL HIGH (ref 4.0–10.5)
nRBC: 0 % (ref 0.0–0.2)

## 2023-08-06 LAB — BASIC METABOLIC PANEL
Anion gap: 11 (ref 5–15)
BUN: <5 mg/dL — ABNORMAL LOW (ref 8–23)
CO2: 23 mmol/L (ref 22–32)
Calcium: 7.9 mg/dL — ABNORMAL LOW (ref 8.9–10.3)
Chloride: 85 mmol/L — ABNORMAL LOW (ref 98–111)
Creatinine, Ser: 0.47 mg/dL — ABNORMAL LOW (ref 0.61–1.24)
GFR, Estimated: >60 mL/min (ref >60)
Glucose, Bld: 68 mg/dL — ABNORMAL LOW (ref 70–99)
Potassium: 3.9 mmol/L (ref 3.5–5.1)
Sodium: 119 mmol/L — CL (ref 135–145)

## 2023-08-06 LAB — COMPREHENSIVE METABOLIC PANEL
ALT: 31 U/L (ref 0–44)
AST: 28 U/L (ref 15–41)
Albumin: 4.1 g/dL (ref 3.5–5.0)
Alkaline Phosphatase: 80 U/L (ref 38–126)
Anion gap: 13 (ref 5–15)
BUN: <5 mg/dL — ABNORMAL LOW (ref 8–23)
CO2: 23 mmol/L (ref 22–32)
Calcium: 8.1 mg/dL — ABNORMAL LOW (ref 8.9–10.3)
Chloride: 80 mmol/L — ABNORMAL LOW (ref 98–111)
Creatinine, Ser: 0.36 mg/dL — ABNORMAL LOW (ref 0.61–1.24)
GFR, Estimated: >60 mL/min (ref >60)
Glucose, Bld: 78 mg/dL (ref 70–99)
Potassium: 3.9 mmol/L (ref 3.5–5.1)
Sodium: 116 mmol/L — CL (ref 135–145)
Total Bilirubin: 1.9 mg/dL — ABNORMAL HIGH (ref 0.3–1.2)
Total Protein: 7.1 g/dL (ref 6.5–8.1)

## 2023-08-06 LAB — OSMOLALITY: Osmolality: 270 mosm/kg — ABNORMAL LOW (ref 275–295)

## 2023-08-06 LAB — CBC
HCT: 37.9 % — ABNORMAL LOW (ref 39.0–52.0)
Hemoglobin: 13.5 g/dL (ref 13.0–17.0)
MCH: 31.5 pg (ref 26.0–34.0)
MCHC: 35.6 g/dL (ref 30.0–36.0)
MCV: 88.3 fL (ref 80.0–100.0)
Platelets: 165 10*3/uL (ref 150–400)
RBC: 4.29 MIL/uL (ref 4.22–5.81)
RDW: 12.7 % (ref 11.5–15.5)
WBC: 10.9 10*3/uL — ABNORMAL HIGH (ref 4.0–10.5)
nRBC: 0 % (ref 0.0–0.2)

## 2023-08-06 LAB — SODIUM
Sodium: 124 mmol/L — ABNORMAL LOW (ref 135–145)
Sodium: 123 mmol/L — ABNORMAL LOW (ref 135–145)
Sodium: 121 mmol/L — ABNORMAL LOW (ref 135–145)

## 2023-08-06 LAB — MAGNESIUM: Magnesium: 1.8 mg/dL (ref 1.7–2.4)

## 2023-08-06 LAB — OSMOLALITY, URINE: Osmolality, Ur: 114 mosm/kg — ABNORMAL LOW (ref 300–900)

## 2023-08-06 LAB — PHOSPHORUS: Phosphorus: 2.7 mg/dL (ref 2.5–4.6)

## 2023-08-06 LAB — TSH: TSH: 1.026 u[IU]/mL (ref 0.350–4.500)

## 2023-08-06 LAB — NA AND K (SODIUM & POTASSIUM), RAND UR
Potassium Urine: 7 mmol/L
Sodium, Ur: 28 mmol/L

## 2023-08-06 LAB — ETHANOL: Alcohol, Ethyl (B): 109 mg/dL — ABNORMAL HIGH (ref <10)

## 2023-08-06 LAB — LIPASE, BLOOD: Lipase: 25 U/L (ref 11–51)

## 2023-08-06 MED ORDER — TAMSULOSIN HCL 0.4 MG PO CAPS
0.4000 mg | ORAL_CAPSULE | Freq: Every day | ORAL | Status: DC
  Administered 2023-08-06 – 2023-08-11 (×6): 0.4 mg via ORAL
  Filled 2023-08-06 (×6): qty 1

## 2023-08-06 MED ORDER — POLYETHYLENE GLYCOL 3350 17 G PO PACK: 17.0000 g | PACK | Freq: Every day | ORAL | Status: DC | PRN

## 2023-08-06 MED ORDER — DEXTROSE 5 % IV SOLN: INTRAVENOUS | Status: DC

## 2023-08-06 MED ORDER — ONDANSETRON HCL 4 MG/2ML IJ SOLN
4.0000 mg | Freq: Four times a day (QID) | INTRAMUSCULAR | Status: DC | PRN
  Administered 2023-08-09 – 2023-08-11 (×2): 4 mg via INTRAVENOUS
  Filled 2023-08-06 (×3): qty 2

## 2023-08-06 MED ORDER — THIAMINE HCL 100 MG/ML IJ SOLN
Freq: Once | INTRAVENOUS | Status: DC
Start: 2023-08-06 — End: 2023-08-06

## 2023-08-06 MED ORDER — FOLIC ACID 1 MG PO TABS: 1.0000 mg | ORAL_TABLET | Freq: Every day | ORAL | Status: DC

## 2023-08-06 MED ORDER — SUCRALFATE 1 G PO TABS
1.0000 g | ORAL_TABLET | Freq: Three times a day (TID) | ORAL | Status: DC
  Administered 2023-08-06 – 2023-08-11 (×20): 1 g via ORAL
  Filled 2023-08-06 (×20): qty 1

## 2023-08-06 MED ORDER — ONDANSETRON 4 MG PO TBDP
4.0000 mg | ORAL_TABLET | Freq: Once | ORAL | Status: AC
  Administered 2023-08-06: 4 mg via ORAL
  Filled 2023-08-06: qty 1

## 2023-08-06 MED ORDER — FOLIC ACID 1 MG PO TABS: 1.0000 mg | ORAL_TABLET | Freq: Once | ORAL | Status: DC

## 2023-08-06 MED ORDER — ADULT MULTIVITAMIN W/MINERALS CH: 1.0000 | ORAL_TABLET | Freq: Every day | ORAL | Status: DC

## 2023-08-06 MED ORDER — BACLOFEN 10 MG PO TABS: 10.0000 mg | ORAL_TABLET | Freq: Three times a day (TID) | ORAL | Status: DC

## 2023-08-06 MED ORDER — THIAMINE HCL 100 MG/ML IJ SOLN: 100.0000 mg | Freq: Every day | INTRAMUSCULAR | Status: DC

## 2023-08-06 MED ORDER — ONDANSETRON HCL 4 MG PO TABS: 4.0000 mg | ORAL_TABLET | Freq: Four times a day (QID) | ORAL | Status: DC | PRN

## 2023-08-06 MED ORDER — MELATONIN 5 MG PO TABS
5.0000 mg | ORAL_TABLET | Freq: Every day | ORAL | Status: DC
  Administered 2023-08-06 – 2023-08-10 (×5): 5 mg via ORAL
  Filled 2023-08-06 (×5): qty 1

## 2023-08-06 MED ORDER — FOLIC ACID 1 MG PO TABS
1.0000 mg | ORAL_TABLET | Freq: Every day | ORAL | Status: DC
  Administered 2023-08-06 – 2023-08-11 (×6): 1 mg via ORAL
  Filled 2023-08-06 (×6): qty 1

## 2023-08-06 MED ORDER — THIAMINE MONONITRATE 100 MG PO TABS
100.0000 mg | ORAL_TABLET | Freq: Every day | ORAL | Status: DC
  Administered 2023-08-06: 100 mg via ORAL
  Filled 2023-08-06: qty 1

## 2023-08-06 MED ORDER — ATORVASTATIN CALCIUM 10 MG PO TABS
10.0000 mg | ORAL_TABLET | Freq: Every day | ORAL | Status: DC
  Administered 2023-08-06 – 2023-08-11 (×6): 10 mg via ORAL
  Filled 2023-08-06 (×6): qty 1

## 2023-08-06 MED ORDER — SODIUM CHLORIDE 0.9 % IV BOLUS: 500.0000 mL | Freq: Once | INTRAVENOUS | Status: DC

## 2023-08-06 MED ORDER — ADULT MULTIVITAMIN W/MINERALS CH
1.0000 | ORAL_TABLET | Freq: Every day | ORAL | Status: DC
  Administered 2023-08-06 – 2023-08-11 (×6): 1 via ORAL
  Filled 2023-08-06 (×6): qty 1

## 2023-08-06 MED ORDER — ATENOLOL 25 MG PO TABS
25.0000 mg | ORAL_TABLET | Freq: Every day | ORAL | Status: DC
  Administered 2023-08-06 – 2023-08-08 (×3): 25 mg via ORAL
  Filled 2023-08-06 (×3): qty 1

## 2023-08-06 MED ORDER — ADULT MULTIVITAMIN W/MINERALS CH: 1.0000 | ORAL_TABLET | Freq: Once | ORAL | Status: DC

## 2023-08-06 MED ORDER — THIAMINE HCL 100 MG/ML IJ SOLN
100.0000 mg | Freq: Every day | INTRAMUSCULAR | Status: AC
  Administered 2023-08-07 – 2023-08-09 (×3): 100 mg via INTRAVENOUS
  Filled 2023-08-06 (×3): qty 2

## 2023-08-06 MED ORDER — ENOXAPARIN SODIUM 40 MG/0.4ML IJ SOSY
40.0000 mg | PREFILLED_SYRINGE | INTRAMUSCULAR | Status: DC
  Administered 2023-08-06 – 2023-08-11 (×6): 40 mg via SUBCUTANEOUS
  Filled 2023-08-06 (×6): qty 0.4

## 2023-08-06 MED ORDER — LORAZEPAM 1 MG PO TABS
1.0000 mg | ORAL_TABLET | ORAL | Status: DC | PRN
  Administered 2023-08-06 (×4): 1 mg via ORAL
  Administered 2023-08-07: 2 mg via ORAL
  Administered 2023-08-07: 3 mg via ORAL
  Administered 2023-08-07: 1 mg via ORAL
  Administered 2023-08-07: 3 mg via ORAL
  Administered 2023-08-08: 1 mg via ORAL
  Filled 2023-08-06: qty 1
  Filled 2023-08-06: qty 2
  Filled 2023-08-06: qty 1
  Filled 2023-08-06: qty 2
  Filled 2023-08-06 (×3): qty 1
  Filled 2023-08-06 (×2): qty 3

## 2023-08-06 MED ORDER — ENSURE ENLIVE PO LIQD
237.0000 mL | Freq: Two times a day (BID) | ORAL | Status: DC
  Administered 2023-08-06 – 2023-08-11 (×9): 237 mL via ORAL

## 2023-08-06 MED ORDER — SODIUM CHLORIDE 0.9 % IV SOLN: INTRAVENOUS | Status: DC

## 2023-08-06 MED ORDER — ACETAMINOPHEN 650 MG RE SUPP: 650.0000 mg | Freq: Four times a day (QID) | RECTAL | Status: DC | PRN

## 2023-08-06 MED ORDER — THIAMINE MONONITRATE 100 MG PO TABS: 100.0000 mg | ORAL_TABLET | Freq: Every day | ORAL | Status: DC

## 2023-08-06 MED ORDER — MAGNESIUM HYDROXIDE 400 MG/5ML PO SUSP
30.0000 mL | Freq: Every day | ORAL | Status: DC | PRN
  Administered 2023-08-11: 30 mL via ORAL
  Filled 2023-08-06: qty 30

## 2023-08-06 MED ORDER — PANTOPRAZOLE SODIUM 40 MG PO TBEC
40.0000 mg | DELAYED_RELEASE_TABLET | Freq: Every day | ORAL | Status: DC
  Administered 2023-08-06 – 2023-08-11 (×6): 40 mg via ORAL
  Filled 2023-08-06 (×6): qty 1

## 2023-08-06 MED ORDER — SODIUM CHLORIDE 0.9 % IV BOLUS
1000.0000 mL | Freq: Once | INTRAVENOUS | Status: AC
  Administered 2023-08-06: 1000 mL via INTRAVENOUS

## 2023-08-06 MED ORDER — TRAZODONE HCL 50 MG PO TABS
25.0000 mg | ORAL_TABLET | Freq: Every evening | ORAL | Status: DC | PRN
  Administered 2023-08-07: 25 mg via ORAL
  Filled 2023-08-06: qty 1

## 2023-08-06 MED ORDER — ACETAMINOPHEN 325 MG PO TABS
650.0000 mg | ORAL_TABLET | Freq: Four times a day (QID) | ORAL | Status: DC | PRN
  Administered 2023-08-08 – 2023-08-09 (×2): 650 mg via ORAL
  Filled 2023-08-06 (×2): qty 2

## 2023-08-06 NOTE — Progress Notes (Signed)
PROGRESS NOTE - brief, see full H&P from earlier today     [name].   AOZ:[id number] DOB: [birth year]  DOA: 08/06/2023 Date of Service: 08/06/23 which is hospital day 1  PCP: Pcp, No    HPI: Douglas Peterson. is a 72 y.o. 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   Hospital course / significant events:  10/02: to ED 10/03: Hyponatremia 116, admitted early AM to hospitalist service   Date/Time Sodium  Intervention   10/01 128   10/03    01:30  116 NS 100 mL/h  05:00 119 Reduced NS to 75 mL/h  09:30 124 Very close to 24h goal already, Stop NS and start D5W to prevent/reverse rapid correction              10/04    01:30  GOAL SODIUM MAX 124-126              Consultants:  none  Procedures/Surgeries: none      ASSESSMENT & PLAN:   Hyponatremia - acute on chronic  Likely secondary to alcohol-related potomania and chronic alcohol use . NS fluids Sodium check q4h Goal correction of sodium  8-10 x24h max    Alcohol intoxication High risk EtOH withdrawal  banana bag daily. High dose thiamine 100 mg IV daily x3 days for prevention Wernicke  as needed Ativan for withdrawal - CIWA protocol    Dyslipidemia statin   Essential hypertension Continue home atenolol   BPH (benign prostatic hyperplasia) continue home Flomax.   GERD without esophagitis Continue home protonix              Subjective / Brief ROS:  Patient reports feeling ok this morning Denies CP/SOB.  Pain controlled.  Denies new weakness.  Tolerating diet.  Reports no concerns w/ urination/defecation.   Family Communication: none at this time     Objective Findings:  Vitals:    08/06/23 0726 08/06/23 0800 08/06/23 1253 08/06/23 1425  BP: 136/74 (!) 142/78    Pulse: 63 84 79 82  Resp: 14 18    Temp:      TempSrc:      SpO2: 97% 94%    Weight:      Height:       No intake or output data in the 24 hours ending 08/06/23 1441 Filed Weights   08/05/23 2150  Weight: 63.5 kg    Examination:  Physical Exam Constitutional:      General: He is not in acute distress. Cardiovascular:     Rate and Rhythm: Normal rate and regular rhythm.  Abdominal:     Palpations: Abdomen is soft.     Tenderness: There is no abdominal tenderness.  Neurological:     Mental Status: He is alert.  Psychiatric:        Mood and Affect: Mood normal.        Behavior: Behavior normal.         Time spent: 35 min    Sunnie Nielsen, DO [facility] 08/06/2023, 2:41 PM    Dictation software may have been used to generate the above note. Typos may occur and escape review in typed/dictated notes. Please contact Dr Lyn Hollingshead directly for clarity if needed.  Staff may message me via  secure chat in Epic  but this may not receive an immediate response,  please page me for urgent matters!  If 7PM-7AM, please contact night coverage [email or web address]

## 2023-08-06 NOTE — Assessment & Plan Note (Signed)
- 

## 2023-08-06 NOTE — Hospital Course (Addendum)
HPI: Douglas Peterson. is a 72 y.o. 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   Hospital course / significant events:  10/02: to ED 10/03: Hyponatremia 116, admitted early AM to hospitalist service  10/04: sodium as below  10/05: more somnolent this morning, question medication effect, broaden workup if not more alert this afternoon but he is oriented --> more alert this afternoon  10/06: placement pending (today is Sunday)  10/07: placement pending, working w/ PT/OT here  10/08: now declining SNF and wants home w/ HH, d/c home   Date/Time Sodium  Intervention   10/01 128   10/03 -----   01:30  116 NS 100 mL/h  05:00 119 Reduced NS to 75 mL/h  09:30 124 Very close to 24h goal already, Stop NS and start D5W to prevent/reverse rapid correction  14:00 123   18:30 121   10/04 -----   00:00 120 GOAL SODIUM MAX 124-126 based on initial level  02:30 121 Stop D5W - off fluids ordered 07:00  10/05 -----   01:30 129   09:00 128 Off fluids >24h   13:00 130   10/06 ---   15:00 134               Consultants:  none  Procedures/Surgeries: none      ASSESSMENT & PLAN:   Hyponatremia - acute on chronic - improved   Likely secondary to alcohol-related potomania and chronic alcohol use . Monitor BMP    Somnolence - improved  Likely medication effect Have d/c ativan  Have d/c trazodone, reduced baclofen  Avoid opiates   Alcohol intoxication High risk EtOH withdrawal  High dose thiamine 100 mg IV daily x3 days for prevention Wernicke  as needed Ativan for withdrawal  CIWA protocol    Dyslipidemia statin   Essential hypertension - normotensive / soft BP here  Restarted lower dose  atenolol   BPH (benign prostatic hyperplasia) continue home Flomax.   GERD without esophagitis Continue home protonix         DVT prophylaxis: lovenox  IV fluids: no continuous IV fluids  Nutrition: cardiac diet  Central lines / invasive devices: none  Code Status: FULL CODE ACP documentation reviewed:  none on file in [facility]  TOC needs: SNF placement  Barriers to dispo / significant pending items: somnolent, hyponatermia but this is improving, placement

## 2023-08-06 NOTE — ED Notes (Signed)
Patient moved to room 2, connected to CCM with vital signs stable at this time. Patient medicated per orders. Urinal at bedside for patient comfort, call light within reach.

## 2023-08-06 NOTE — Assessment & Plan Note (Signed)
-   We will place him on a banana bag daily. - He will be placed on as needed Ativan for withdrawal.

## 2023-08-06 NOTE — ED Notes (Signed)
Secure chat Regino Bellow, MD to notify of NA of 119

## 2023-08-06 NOTE — Assessment & Plan Note (Signed)
-   This like secondary to alcohol-related potomania. - The patient will be admitted to a medical telemetry observation bed. - Will obtain hyponatremia workup. - The patient will be placed on hydration with IV normal saline. - Will follow serial BMPs.

## 2023-08-06 NOTE — Assessment & Plan Note (Signed)
-   We will continue statin therapy. 

## 2023-08-06 NOTE — ED Notes (Signed)
Modesto Charon, MD notified of critical NA of 116.

## 2023-08-06 NOTE — Assessment & Plan Note (Signed)
-   We will continue PPI therapy 

## 2023-08-06 NOTE — ED Provider Notes (Addendum)
[facility] Provider Note    Event Date/Time   First MD Initiated Contact with Patient 08/06/23 [phone]     (approximate)   History   Abdominal Pain   HPI  Douglas Simkin. is a 72 y.o. male   Past medical history of alcohol use, atrial fibrillation not on anticoagulation, chronic hyponatremia, hypertension, TBI who presents to the emergency department with alcohol intoxication and chronic nausea.  He denies shortness of breath or chest pain.  He denies cough or fever.  He states that he drank alcohol today.  He has not vomited.  He has no abdominal pain.   External Medical Documents Reviewed: Recent emergency department visits for alcohol intoxication hyponatremia.      Physical Exam   Triage Vital Signs: ED Triage Vitals  Encounter Vitals Group     BP 08/05/23 2352 (!) 147/74     Systolic BP Percentile --      Diastolic BP Percentile --      Pulse Rate 08/05/23 2352 68     Resp 08/05/23 2352 16     Temp 08/05/23 2352 98.2 F (36.8 C)     Temp Source 08/05/23 2352 Oral     SpO2 08/05/23 2352 94 %     Weight 08/05/23 2150 139 lb 15.9 oz (63.5 kg)     Height 08/05/23 2150 5\' 6"  (1.676 m)     Head Circumference --      Peak Flow --      Pain Score 08/05/23 2150 10     Pain Loc --      Pain Education --      Exclude from Growth Chart --     Most recent vital signs: Vitals:   08/05/23 2352  BP: (!) 147/74  Pulse: 68  Resp: 16  Temp: 98.2 F (36.8 C)  SpO2: 94%    General: Awake, no distress.  CV:  Good peripheral perfusion.  Resp:  Normal effort.  Abd:  No distention.  Other:  Chronically ill-appearing but in no acute distress, conversant and appropriate.  Soft nontender abdomen to deep palpation all quadrants.  Slightly hypertensive otherwise vital signs normal, no fever, and clear lung sounds to auscultation in all lung fields without focality wheezing or rales.   ED Results / Procedures / Treatments   Labs (all labs  ordered are listed, but only abnormal results are displayed) Labs Reviewed  COMPREHENSIVE METABOLIC PANEL - Abnormal; Notable for the following components:      Result Value   Sodium 116 (*)    Chloride 80 (*)    BUN <5 (*)    Creatinine, Ser 0.36 (*)    Calcium 8.1 (*)    Total Bilirubin 1.9 (*)    All other components within normal limits  CBC WITH DIFFERENTIAL/PLATELET - Abnormal; Notable for the following components:   WBC 12.7 (*)    HCT 37.8 (*)    Lymphs Abs 7.0 (*)    All other components within normal limits  ETHANOL - Abnormal; Notable for the following components:   Alcohol, Ethyl (B) 109 (*)    All other components within normal limits  LIPASE, BLOOD     I ordered and reviewed the above labs they are notable for white blood cell count mildly elevated 12.7 and H&H at baseline  EKG  ED ECG REPORT I, Pilar Jarvis, the attending physician, personally viewed and interpreted this ECG.   Date: 08/06/2023  EKG Time: 0105  Rate: 69  Rhythm: nsr  Axis: nl  Intervals:none  ST&T Change: no stemi   PROCEDURES:  Critical Care performed: No  Procedures   MEDICATIONS ORDERED IN ED: Medications  sodium chloride 0.9 % bolus 500 mL (has no administration in time range)  ondansetron (ZOFRAN-ODT) disintegrating tablet 4 mg (4 mg Oral Given 08/06/23 0151)    IMPRESSION / MDM / ASSESSMENT AND PLAN / ED COURSE  I reviewed the triage vital signs and the nursing notes.                                Patient's presentation is most consistent with acute presentation with potential threat to life or bodily function.  Differential diagnosis includes, but is not limited to, alcohol intoxication, pancreatitis, biliary colic, electrolyte derangement like hypokalemia  The patient is on the cardiac monitor to evaluate for evidence of arrhythmia and/or significant heart rate changes.  MDM:    Patient with alcohol use appears intoxicated today admits to alcohol use and chronic  nausea without abdominal pain with a benign abdominal exam doubt surgical abdomen at this time.  Noted in triage to complain of shortness of breath but none currently to me, with clear lungs no respiratory distress and no hypoxemia doubt pneumonia or PE at this time.  Will medicate with Zofran for nausea.  Will check his labs today to assess the degree of hyponatremia, & LFTs, lipase.  Hold for sobriety.   -- Family hyponatremic 116, will give initial hydration suspect beer Potomania as the etiology, no neurologic symptoms and no seizures, admission.       FINAL CLINICAL IMPRESSION(S) / ED DIAGNOSES   Final diagnoses:  Nausea  Alcoholic intoxication without complication (HCC)  Hyponatremia     Rx / DC Orders   ED Discharge Orders     None        Note:  This document was prepared using Dragon voice recognition software and may include unintentional dictation errors.    Pilar Jarvis, MD 08/06/23 Daiva Huge    Pilar Jarvis, MD 08/06/23 [phone]
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
## 2023-08-06 NOTE — Assessment & Plan Note (Signed)
-   We will continue Flomax 

## 2023-08-07 ENCOUNTER — Encounter: Payer: Self-pay | Admitting: Family Medicine

## 2023-08-07 DIAGNOSIS — E871 Hypo-osmolality and hyponatremia: Secondary | ICD-10-CM | POA: Diagnosis not present

## 2023-08-07 LAB — SODIUM
Sodium: 120 mmol/L — ABNORMAL LOW (ref 135–145)
Sodium: 121 mmol/L — ABNORMAL LOW (ref 135–145)
Sodium: 122 mmol/L — ABNORMAL LOW (ref 135–145)
Sodium: 125 mmol/L — ABNORMAL LOW (ref 135–145)
Sodium: 127 mmol/L — ABNORMAL LOW (ref 135–145)

## 2023-08-07 MED ORDER — BACLOFEN 10 MG PO TABS
10.0000 mg | ORAL_TABLET | Freq: Three times a day (TID) | ORAL | Status: DC
  Administered 2023-08-07 – 2023-08-08 (×4): 10 mg via ORAL
  Filled 2023-08-07 (×4): qty 1
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
## 2023-08-08 DIAGNOSIS — E871 Hypo-osmolality and hyponatremia: Secondary | ICD-10-CM | POA: Diagnosis not present

## 2023-08-08 LAB — CBC
HCT: 41.1 % (ref 39.0–52.0)
Hemoglobin: 14.9 g/dL (ref 13.0–17.0)
MCH: 31.7 pg (ref 26.0–34.0)
MCHC: 36.3 g/dL — ABNORMAL HIGH (ref 30.0–36.0)
MCV: 87.4 fL (ref 80.0–100.0)
Platelets: 195 10*3/uL (ref 150–400)
RBC: 4.7 MIL/uL (ref 4.22–5.81)
RDW: 13.3 % (ref 11.5–15.5)
WBC: 5.7 10*3/uL (ref 4.0–10.5)
nRBC: 0 % (ref 0.0–0.2)

## 2023-08-08 LAB — COMPREHENSIVE METABOLIC PANEL
ALT: 29 U/L (ref 0–44)
AST: 32 U/L (ref 15–41)
Albumin: 4 g/dL (ref 3.5–5.0)
Alkaline Phosphatase: 73 U/L (ref 38–126)
Anion gap: 12 (ref 5–15)
BUN: 6 mg/dL — ABNORMAL LOW (ref 8–23)
CO2: 21 mmol/L — ABNORMAL LOW (ref 22–32)
Calcium: 9 mg/dL (ref 8.9–10.3)
Chloride: 97 mmol/L — ABNORMAL LOW (ref 98–111)
Creatinine, Ser: 0.47 mg/dL — ABNORMAL LOW (ref 0.61–1.24)
GFR, Estimated: >60 mL/min (ref >60)
Glucose, Bld: 99 mg/dL (ref 70–99)
Potassium: 3.9 mmol/L (ref 3.5–5.1)
Sodium: 130 mmol/L — ABNORMAL LOW (ref 135–145)
Total Bilirubin: 0.9 mg/dL (ref 0.3–1.2)
Total Protein: 7.2 g/dL (ref 6.5–8.1)

## 2023-08-08 LAB — SODIUM
Sodium: 129 mmol/L — ABNORMAL LOW (ref 135–145)
Sodium: 128 mmol/L — ABNORMAL LOW (ref 135–145)

## 2023-08-08 NOTE — Plan of Care (Signed)
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
## 2023-08-09 DIAGNOSIS — E871 Hypo-osmolality and hyponatremia: Secondary | ICD-10-CM | POA: Diagnosis not present

## 2023-08-09 LAB — BASIC METABOLIC PANEL
Anion gap: 12 (ref 5–15)
BUN: 12 mg/dL (ref 8–23)
CO2: 25 mmol/L (ref 22–32)
Calcium: 9.4 mg/dL (ref 8.9–10.3)
Chloride: 97 mmol/L — ABNORMAL LOW (ref 98–111)
Creatinine, Ser: 0.66 mg/dL (ref 0.61–1.24)
GFR, Estimated: >60 mL/min (ref >60)
Glucose, Bld: 116 mg/dL — ABNORMAL HIGH (ref 70–99)
Potassium: 3.6 mmol/L (ref 3.5–5.1)
Sodium: 134 mmol/L — ABNORMAL LOW (ref 135–145)

## 2023-08-09 NOTE — Plan of Care (Signed)

## 2023-08-09 NOTE — Plan of Care (Signed)
  Problem: Education: Goal: Knowledge of General Education information will improve Description: Including pain rating scale, medication(s)/side effects and non-pharmacologic comfort measures 08/09/2023 1842 by Sheria Lang, RN Outcome: Progressing 08/09/2023 1831 by Sheria Lang, RN Outcome: Progressing   Problem: Health Behavior/Discharge Planning: Goal: Ability to manage health-related needs will improve 08/09/2023 1842 by Sheria Lang, RN Outcome: Progressing 08/09/2023 1831 by Sheria Lang, RN Outcome: Progressing   Problem: Clinical Measurements: Goal: Ability to maintain clinical measurements within normal limits will improve 08/09/2023 1842 by Sheria Lang, RN Outcome: Progressing 08/09/2023 1831 by Sheria Lang, RN Outcome: Progressing Goal: Will remain free from infection 08/09/2023 1842 by Sheria Lang, RN Outcome: Progressing 08/09/2023 1831 by Sheria Lang, RN Outcome: Progressing Goal: Diagnostic test results will improve 08/09/2023 1842 by Sheria Lang, RN Outcome: Progressing 08/09/2023 1831 by Sheria Lang, RN Outcome: Progressing Goal: Respiratory complications will improve 08/09/2023 1842 by Sheria Lang, RN Outcome: Progressing 08/09/2023 1831 by Sheria Lang, RN Outcome: Progressing Goal: Cardiovascular complication will be avoided 08/09/2023 1842 by Sheria Lang, RN Outcome: Progressing 08/09/2023 1831 by Sheria Lang, RN Outcome: Progressing   Problem: Activity: Goal: Risk for activity intolerance will decrease 08/09/2023 1842 by Sheria Lang, RN Outcome: Progressing 08/09/2023 1831 by Sheria Lang, RN Outcome: Progressing   Problem: Nutrition: Goal: Adequate nutrition will be maintained 08/09/2023 1842 by Sheria Lang, RN Outcome: Progressing 08/09/2023 1831 by Sheria Lang, RN Outcome: Progressing   Problem: Coping: Goal: Level of anxiety will decrease 08/09/2023 1842 by Sheria Lang, RN Outcome: Progressing 08/09/2023 1831 by Sheria Lang, RN Outcome: Progressing    Problem: Elimination: Goal: Will not experience complications related to bowel motility 08/09/2023 1842 by Sheria Lang, RN Outcome: Progressing 08/09/2023 1831 by Sheria Lang, RN Outcome: Progressing Goal: Will not experience complications related to urinary retention 08/09/2023 1842 by Sheria Lang, RN Outcome: Progressing 08/09/2023 1831 by Sheria Lang, RN Outcome: Progressing   Problem: Pain Managment: Goal: General experience of comfort will improve 08/09/2023 1842 by Sheria Lang, RN Outcome: Progressing 08/09/2023 1831 by Sheria Lang, RN Outcome: Progressing   Problem: Safety: Goal: Ability to remain free from injury will improve 08/09/2023 1842 by Sheria Lang, RN Outcome: Progressing 08/09/2023 1831 by Sheria Lang, RN Outcome: Progressing   Problem: Skin Integrity: Goal: Risk for impaired skin integrity will decrease 08/09/2023 1842 by Sheria Lang, RN Outcome: Progressing 08/09/2023 1831 by Sheria Lang, RN Outcome: Progressing
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
## 2023-08-10 DIAGNOSIS — E871 Hypo-osmolality and hyponatremia: Secondary | ICD-10-CM | POA: Diagnosis not present

## 2023-08-10 LAB — BASIC METABOLIC PANEL
Anion gap: 11 (ref 5–15)
BUN: 17 mg/dL (ref 8–23)
CO2: 26 mmol/L (ref 22–32)
Calcium: 9.3 mg/dL (ref 8.9–10.3)
Chloride: 98 mmol/L (ref 98–111)
Creatinine, Ser: 0.65 mg/dL (ref 0.61–1.24)
GFR, Estimated: >60 mL/min (ref >60)
Glucose, Bld: 109 mg/dL — ABNORMAL HIGH (ref 70–99)
Potassium: 4.2 mmol/L (ref 3.5–5.1)
Sodium: 135 mmol/L (ref 135–145)

## 2023-08-10 MED ORDER — ATENOLOL 25 MG PO TABS
12.5000 mg | ORAL_TABLET | Freq: Every day | ORAL | Status: DC
  Administered 2023-08-10 – 2023-08-11 (×2): 12.5 mg via ORAL
  Filled 2023-08-10 (×2): qty 1

## 2023-08-10 MED ORDER — BACLOFEN 10 MG PO TABS
5.0000 mg | ORAL_TABLET | Freq: Three times a day (TID) | ORAL | Status: DC
  Administered 2023-08-10 – 2023-08-11 (×4): 5 mg via ORAL
  Filled 2023-08-10 (×4): qty 1
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
## 2023-08-10 NOTE — Care Management Important Message (Signed)
Important Message  Patient Details  Name: Douglas Peterson. MRN: [id number] Date of Birth: [birth year]   Important Message Given:  Yes - Medicare IM     Bernadette Hoit 08/10/2023, 3:30 PM
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
## 2023-08-10 NOTE — Plan of Care (Signed)

## 2023-08-10 NOTE — Progress Notes (Signed)
PROGRESS NOTE - brief, see full H&P from earlier today     [name].   IRS:[id number] DOB: Feb 23, 1951  DOA: 08/06/2023 Date of Service: 08/10/23 which is hospital day 4  PCP: Pcp, No    HPI: Douglas Peterson. is a 72 y.o. 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   Hospital course / significant events:  10/02: to ED 10/03: Hyponatremia 116, admitted early AM to hospitalist service  10/04: sodium as below  10/05: more somnolent this morning, question medication effect, broaden workup if not more alert this afternoon but he is oriented --> more alert this afternoon  10/06: placement pending (today is Sunday)  10/07: placement pending, working w/ PT/OT here   Date/Time Sodium  Intervention   10/01 128   10/03 -----   01:30  116 NS 100 mL/h  05:00 119 Reduced NS to 75 mL/h  09:30 124 Very close to 24h goal already, Stop NS and start D5W to prevent/reverse rapid correction  14:00 123   18:30 121   10/04 -----   00:00 120 GOAL SODIUM MAX 124-126 based on initial level  02:30 121 Stop D5W - off fluids ordered 07:00  10/05 -----   01:30 129   09:00 128 Off fluids >24h   13:00 130   10/06 ---   15:00 134               Consultants:  none  Procedures/Surgeries: none      ASSESSMENT & PLAN:   Hyponatremia - acute on chronic - improved   Likely secondary to alcohol-related potomania and chronic alcohol use . Monitor BMP    Somnolence - improved  Likely medication effect Have d/c ativan (still continue CIWA and alert provider if scoring high)  Have d/c trazodone, baclofen  Avoid opiates   Alcohol intoxication High risk EtOH withdrawal  High dose thiamine 100 mg IV  daily x3 days for prevention Wernicke  as needed Ativan for withdrawal  CIWA protocol    Dyslipidemia statin   Essential hypertension - normotensive / soft BP here  holding home atenolol   BPH (benign prostatic hyperplasia) continue home Flomax.   GERD without esophagitis Continue home protonix         DVT prophylaxis: lovenox  IV fluids: no continuous IV fluids  Nutrition: cardiac diet  Central lines / invasive devices: none  Code Status: FULL CODE ACP documentation reviewed:  none on file in [facility]  TOC needs: SNF placement  Barriers to dispo / significant pending items: somnolent, hyponatermia but this is improving, placement                Subjective / Brief ROS:  Patient reports feeling ok this morning Walking some w/ PT General weakness Denies CP/SOB.  Pain controlled.  Tolerating diet.  Reports no concerns w/ urination/defecation.   Family Communication: none at this time     Objective Findings:  Vitals:   08/09/23 0829 08/09/23 1546 08/09/23 2235 08/10/23 0805  BP: (!) 147/79 117/69 (!) 138/91 (!) 144/86  Pulse: 64 86 91 77  Resp: 16 16 18 14   Temp: 97.9 F (36.6 C) 98.3 F (36.8 C) 98.2 F (36.8 C) 98.3 F (36.8 C)  TempSrc:   Oral   SpO2:  98% 98% 95% 93%  Weight:      Height:        Intake/Output Summary (Last 24 hours) at 08/10/2023 1518 Last data filed at 08/10/2023 1039 Gross per 24 hour  Intake 240 ml  Output 100 ml  Net 140 ml   Filed Weights   08/05/23 2150  Weight: 63.5 kg    Examination:  Physical Exam Constitutional:      General: He is not in acute distress. Cardiovascular:     Rate and Rhythm: Normal rate and regular rhythm.  Abdominal:     Palpations: Abdomen is soft.     Tenderness: There is no abdominal tenderness.  Psychiatric:        Behavior: Behavior normal.           Sunnie Nielsen, DO [facility] 08/10/2023, 3:18 PM    Dictation software may have been used to generate  the above note. Typos may occur and escape review in typed/dictated notes. Please contact Dr Lyn Hollingshead directly for clarity if needed.  Staff may message me via secure chat in Epic  but this may not receive an immediate response,  please page me for urgent matters!  If 7PM-7AM, please contact night coverage [email or web address]

## 2023-08-10 NOTE — [place] FL2 (Signed)
[place] MEDICAID FL2 LEVEL OF CARE FORM     IDENTIFICATION  Patient Name: Douglas Peterson. Birthdate: Jul 01, 1951 Sex: male Admission Date (Current Location): 08/06/2023  [place] and [place] Number:  Chiropodist and Address:  [facility], [address] [place], [place] [address]      Provider Number: [id number]  Attending Physician Name and Address:  Sunnie Nielsen, DO  Relative Name and Phone Number:       Current Level of Care: Hospital Recommended Level of Care: Skilled Nursing Facility Prior Approval Number:    Date Approved/Denied:   PASRR Number: [id number]  Discharge Plan: SNF    Current Diagnoses: Patient Active Problem List   Diagnosis Date Noted   GERD without esophagitis 08/06/2023   Nausea 07/19/2023   Pressure injury of skin 07/11/2023   Alcohol abuse 07/11/2023   Abdominal pain 06/16/2023   Elevated lactic acid level 06/16/2023   Chronic diastolic CHF (congestive heart failure) (HCC) 06/16/2023   Increased urinary frequency 06/08/2023   Diarrhea 06/08/2023   Upper respiratory infection 06/04/2023   Enteritis 06/02/2023   Polyuria 06/02/2023   Dyslipidemia 05/01/2023   Alcohol dependence (HCC) 05/01/2023   Maggot infestation feet 05/11/2022   Unable to care for self 05/11/2022   Debility 04/25/2022   Weakness 04/23/2022   Epistaxis 04/23/2022   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   Generalized weakness 03/18/2022   Nondisplaced fracture of shaft of left clavicle, initial encounter for closed fracture 03/18/2022   BPH (benign prostatic hyperplasia) 03/18/2022   Clavicle fracture 03/17/2022   Fall 03/17/2022   CAP (community acquired pneumonia) 03/13/2022   Altered mental status, unspecified 02/20/2022   Hypotension 02/19/2022   Dysphonia 08/13/2021   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   Paresthesia of right upper extremity 08/13/2021   SIRS (systemic inflammatory response syndrome) (HCC)  08/05/2020   GERD (gastroesophageal reflux disease) 06/05/2020   Alcohol use disorder, severe, dependence (HCC) 05/28/2020   Elevated LFTs 04/06/2020   Paroxysmal A-fib (HCC) 03/17/2020   Primary insomnia 02/06/2020   Left inguinal hernia 01/31/2020   Encounter for competency evaluation    Depression 01/17/2020   Pancytopenia (HCC)    Hypomagnesemia    Alcoholic cirrhosis of liver without ascites (HCC)    ETOH abuse    Alcohol intoxication (HCC)    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) 09/27/2019   Hypokalemia 09/27/2019   Alcohol withdrawal delirium, acute, hyperactive (HCC) 09/21/2019   Pressure injury of ankle, stage 1 08/15/2019   Palliative care by specialist    Closed fracture of right proximal humerus 03/19/2019   Vitamin D deficiency 01/11/2019   Osteoporosis 12/22/2018   Closed nondisplaced fracture of acromial process of right scapula with routine healing 11/15/2018   Compression fracture of L2 vertebra with routine healing 11/15/2018   Delirium tremens (HCC) 03/08/2018   Essential hypertension 09/16/2017   Encephalopathy, 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   Steatohepatitis 12/03/2016   Abnormal liver enzymes 06/17/2016   Hereditary hemochromatosis (HCC) 06/17/2016   Anxiety 07/16/2015   Hyponatremia 07/09/2015   H/O traumatic brain injury 05/22/2014   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HCC) 05/22/2014    Orientation RESPIRATION BLADDER Height & Weight     Self, Time, Situation, Place  Normal Continent Weight: 63.5 kg Height:  5\' 6"  (167.6 cm)  BEHAVIORAL SYMPTOMS/MOOD NEUROLOGICAL BOWEL NUTRITION STATUS      Continent Diet (See DC summary)  AMBULATORY STATUS COMMUNICATION OF NEEDS Skin   Limited Assist Verbally Normal  Personal Care Assistance Level of Assistance  Bathing, Feeding, Dressing Bathing Assistance: Limited assistance Feeding assistance: Independent Dressing Assistance: Limited assistance      Functional Limitations Info  Sight, Hearing, Speech Sight Info: Adequate Hearing Info: Adequate Speech Info: Adequate    SPECIAL CARE FACTORS FREQUENCY  PT (By licensed PT), OT (By licensed OT)     PT Frequency: 5 times per week OT Frequency: 5 times per week            Contractures Contractures Info: Not present    Additional Factors Info  Code Status, Allergies Code Status Info: full code Allergies Info: HCTZ           Current Medications (08/10/2023):  This is the current hospital active medication list Current Facility-Administered Medications  Medication Dose Route Frequency Provider Last Rate Last Admin   acetaminophen (TYLENOL) tablet 650 mg  650 mg Oral Q6H PRN Mansy, Jan A, MD   650 mg at 08/09/23 2046   Or   acetaminophen (TYLENOL) suppository 650 mg  650 mg Rectal Q6H PRN Mansy, Jan A, MD       atenolol (TENORMIN) tablet 12.5 mg  12.5 mg Oral Daily Sunnie Nielsen, DO   12.5 mg at 08/10/23 1218   atorvastatin (LIPITOR) tablet 10 mg  10 mg Oral Daily Mansy, Jan A, MD   10 mg at 08/10/23 2841   baclofen (LIORESAL) tablet 5 mg  5 mg Oral TID Sunnie Nielsen, DO   5 mg at 08/10/23 1034   enoxaparin (LOVENOX) injection 40 mg  40 mg Subcutaneous Q24H Mansy, Jan A, MD   40 mg at 08/10/23 0909   feeding supplement (ENSURE ENLIVE / ENSURE PLUS) liquid 237 mL  237 mL Oral BID BM Mansy, Jan A, MD   237 mL at 08/10/23 0909   folic acid (FOLVITE) tablet 1 mg  1 mg Oral Daily Otelia Sergeant, RPH   1 mg at 08/10/23 0909   magnesium hydroxide (MILK OF MAGNESIA) suspension 30 mL  30 mL Oral Daily PRN Mansy, Jan A, MD       melatonin tablet 5 mg  5 mg Oral QHS Mansy, Jan A, MD   5 mg at 08/09/23 2046   multivitamin with minerals tablet 1 tablet  1 tablet Oral Daily Otelia Sergeant, RPH   1 tablet at 08/10/23 0908   ondansetron (ZOFRAN) tablet 4 mg  4 mg Oral Q6H PRN Mansy, Jan A, MD       Or   ondansetron [facility]) injection 4 mg  4 mg Intravenous Q6H PRN Mansy, Jan A,  MD   4 mg at 08/09/23 2349   pantoprazole (PROTONIX) EC tablet 40 mg  40 mg Oral Daily Mansy, Jan A, MD   40 mg at 08/10/23 0908   polyethylene glycol (MIRALAX / GLYCOLAX) packet 17 g  17 g Oral Daily PRN Mansy, Jan A, MD       sucralfate (CARAFATE) tablet 1 g  1 g Oral TID WC & HS Mansy, Jan A, MD   1 g at 08/10/23 1218   tamsulosin (FLOMAX) capsule 0.4 mg  0.4 mg Oral Daily Mansy, Jan A, MD   0.4 mg at 08/10/23 0909     Discharge Medications: Please see discharge summary for a list of discharge medications.  Relevant Imaging Results:  Relevant Lab Results:   Additional Information SS#: [id number].  Marlowe Sax, RN

## 2023-08-10 NOTE — TOC Initial Note (Signed)
Transition of Care (TOC) - Initial/Assessment Note    Patient Details  Name: Douglas Peterson. MRN: [id number] Date of Birth: Nov 07, 1950  [facility]) CM/SW Contact:    Marlowe Sax, RN Phone Number: 08/10/2023, 12:20 PM  Clinical Narrative:                 Met with the patient at the bedside He stated that he plans to go home, he will use a Taxi to go home I called and requested [employer] meal delivery to be set up, he uses [employer] delivery He has a rolling walker and Cane at home [facility] will check to see if they can accept the patient for [facility]  Expected Discharge Plan: Home w Home Health Services     Patient Goals and CMS Choice   CMS Medicare.gov Compare Post Acute Care list provided to:: Patient        Expected Discharge Plan and Services   Discharge Planning Services: CM Consult Post Acute Care Choice: Home Health Living arrangements for the past 2 months: Single Family Home                 DME Arranged: N/A DME Agency: NA       HH Arranged: PT HH Agency: [facility] Date HH Agency Contacted: 08/10/23 Time HH Agency Contacted: 1216 Representative spoke with at [facility] Agency: [place]  Prior Living Arrangements/Services Living arrangements for the past 2 months: Single Family Home Lives with:: Self Patient language and need for interpreter reviewed:: Yes Do you feel safe going back to the place where you live?: Yes      Need for Family Participation in Patient Care: Yes (Comment) Care giver support system in place?: No (comment) Current home services: DME (cane, rolling walker) Criminal Activity/Legal Involvement Pertinent to Current Situation/Hospitalization: No - Comment as needed  Activities of Daily Living   ADL Screening (condition at time of admission) Independently performs ADLs?: No Does the patient have a NEW difficulty with bathing/dressing/toileting/self-feeding that is expected to last >3 days?:  No Does the patient have a NEW difficulty with getting in/out of bed, walking, or climbing stairs that is expected to last >3 days?: No Does the patient have a NEW difficulty with communication that is expected to last >3 days?: No Is the patient deaf or have difficulty hearing?: No Does the patient have difficulty seeing, even when wearing glasses/contacts?: No Does the patient have difficulty concentrating, remembering, or making decisions?: No  Permission Sought/Granted   Permission granted to share information with : Yes, Verbal Permission Granted              Emotional Assessment Appearance:: Appears older than stated age Attitude/Demeanor/Rapport: Engaged Affect (typically observed): Accepting, Pleasant Orientation: : Oriented to Self, Oriented to Place, Oriented to  Time, Oriented to Situation Alcohol / Substance Use: Alcohol Use Psych Involvement: No (comment)  Admission diagnosis:  Hyponatremia [E87.1] Nausea [R11.0] Alcoholic intoxication without complication (HCC) [F10.920] Patient Active Problem List   Diagnosis Date Noted   GERD without esophagitis 08/06/2023   Nausea 07/19/2023   Pressure injury of skin 07/11/2023   Alcohol abuse 07/11/2023   Abdominal pain 06/16/2023   Elevated lactic acid level 06/16/2023   Chronic diastolic CHF (congestive heart failure) (HCC) 06/16/2023   Increased urinary frequency 06/08/2023   Diarrhea 06/08/2023   Upper respiratory infection 06/04/2023   Enteritis 06/02/2023   Polyuria 06/02/2023   Dyslipidemia 05/01/2023   Alcohol dependence (HCC) 05/01/2023  Maggot infestation feet 05/11/2022   Unable to care for self 05/11/2022   Debility 04/25/2022   Weakness 04/23/2022   Epistaxis 04/23/2022   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   Generalized weakness 03/18/2022   Nondisplaced fracture of shaft of left clavicle, initial encounter for closed fracture 03/18/2022   BPH (benign prostatic hyperplasia) 03/18/2022    Clavicle fracture 03/17/2022   Fall 03/17/2022   CAP (community acquired pneumonia) 03/13/2022   Altered mental status, unspecified 02/20/2022   Hypotension 02/19/2022   Dysphonia 08/13/2021   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   Paresthesia of right upper extremity 08/13/2021   SIRS (systemic inflammatory response syndrome) (HCC) 08/05/2020   GERD (gastroesophageal reflux disease) 06/05/2020   Alcohol use disorder, severe, dependence (HCC) 05/28/2020   Elevated LFTs 04/06/2020   Paroxysmal A-fib (HCC) 03/17/2020   Primary insomnia 02/06/2020   Left inguinal hernia 01/31/2020   Encounter for competency evaluation    Depression 01/17/2020   Pancytopenia (HCC)    Hypomagnesemia    Alcoholic cirrhosis of liver without ascites (HCC)    ETOH abuse    Alcohol intoxication (HCC)    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) 09/27/2019   Hypokalemia 09/27/2019   Alcohol withdrawal delirium, acute, hyperactive (HCC) 09/21/2019   Pressure injury of ankle, stage 1 08/15/2019   Palliative care by specialist    Closed fracture of right proximal humerus 03/19/2019   Vitamin D deficiency 01/11/2019   Osteoporosis 12/22/2018   Closed nondisplaced fracture of acromial process of right scapula with routine healing 11/15/2018   Compression fracture of L2 vertebra with routine healing 11/15/2018   Delirium tremens (HCC) 03/08/2018   Essential hypertension 09/16/2017   Encephalopathy, 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 [place] [address] Phone: [phone] Fax: [phone]  [facility] [address] [place] [address] Phone: [phone] Fax: [phone]     Social Determinants of Health (SDOH) Social History: SDOH Screenings   Food Insecurity: No Food Insecurity (08/07/2023)  Housing: Low Risk  (08/07/2023)  Transportation Needs: No Transportation Needs (08/07/2023)  Utilities: Not At Risk (08/07/2023)  Financial Resource Strain: Unknown (06/13/2019)  Physical Activity: Unknown (06/13/2019)  Social Connections: Unknown (06/13/2019)  Stress: Unknown (06/13/2019)  Tobacco Use: Medium Risk (08/07/2023)   SDOH Interventions:     Readmission Risk Interventions    06/17/2023    4:23 PM 04/29/2022   10:26 AM 03/14/2022   12:48 PM  Readmission Risk Prevention Plan  Transportation Screening Complete Complete Complete  PCP or Specialist Appt within 3-5 Days   Complete  HRI or Home Care Consult Complete Complete Complete  Social Work Consult for Recovery Care Planning/Counseling Complete Complete Complete  Palliative Care Screening Not Applicable Not Applicable Not Applicable  Medication Review Oceanographer) Complete Complete Complete

## 2023-08-10 NOTE — TOC Progression Note (Signed)
Transition of Care (TOC) - Progression Note    Patient Details  Name: Douglas Peterson. MRN: [id number] Date of Birth: 12-Jun-1951  Transition of Care [facility]) CM/SW Contact  Marlowe Sax, RN Phone Number: 08/10/2023, 3:26 PM  Clinical Narrative:    Met with the patient again, he stated he would cinsider going to [facility] if he could go to a good one, I sent the bedsearch out and will review options with him, his Ins will also have to approve   Expected Discharge Plan: Home w Home Health Services    Expected Discharge Plan and Services   Discharge Planning Services: CM Consult Post Acute Care Choice: Home Health Living arrangements for the past 2 months: Single Family Home                 DME Arranged: N/A DME Agency: NA       HH Arranged: PT HH Agency: [facility] Date HH Agency Contacted: 08/10/23 Time HH Agency Contacted: 1216 Representative spoke with at [facility] Agency: [place]   Social Determinants of Health (SDOH) Interventions SDOH Screenings   Food Insecurity: No Food Insecurity (08/07/2023)  Housing: Low Risk  (08/07/2023)  Transportation Needs: No Transportation Needs (08/07/2023)  Utilities: Not At Risk (08/07/2023)  Financial Resource Strain: Unknown (06/13/2019)  Physical Activity: Unknown (06/13/2019)  Social Connections: Unknown (06/13/2019)  Stress: Unknown (06/13/2019)  Tobacco Use: Medium Risk (08/07/2023)    Readmission Risk Interventions    06/17/2023    4:23 PM 04/29/2022   10:26 AM 03/14/2022   12:48 PM  Readmission Risk Prevention Plan  Transportation Screening Complete Complete Complete  PCP or Specialist Appt within 3-5 Days   Complete  HRI or Home Care Consult Complete Complete Complete  Social Work Consult for Recovery Care Planning/Counseling Complete Complete Complete  Palliative Care Screening Not Applicable Not Applicable Not Applicable  Medication Review Oceanographer) Complete Complete Complete

## 2023-08-10 NOTE — Progress Notes (Signed)
Mobility Specialist - Progress Note   08/10/23 1500  Mobility  Activity Ambulated with assistance in hallway  Level of Assistance Contact guard assist, steadying assist  Assistive Device Cane  Distance Ambulated (ft) 320 ft  Activity Response Tolerated well  $Mobility charge 1 Mobility     Pt lying in bed upon arrival, utilizing RA. Pt agreeable to activity. Ambulated in hallway with CGA. 1 LOB requiring minA to correct. Pt returned to room with alarm set, needs in reach.     [name] Mobility Specialist 08/10/23, 3:09 PM

## 2023-08-10 NOTE — Progress Notes (Signed)
Mobility Specialist - Progress Note   08/10/23 0805  Therapy Vitals  Temp 98.3 F (36.8 C)  Pulse Rate 77  Resp 14  BP (!) 144/86  Patient Position (if appropriate) Lying  Oxygen Therapy  SpO2 93 %  O2 Device Room Air  Mobility  Activity Ambulated with assistance in room;Stood at bedside;Dangled on edge of bed  Level of Assistance Contact guard assist, steadying assist  Assistive Device [employer] Ambulated (ft) 5 ft  Activity Response Tolerated well  Mobility Referral Yes  $Mobility charge 1 Mobility  Mobility Specialist Start Time (ACUTE ONLY) [id number]  Mobility Specialist Stop Time (ACUTE ONLY) [id number]  Mobility Specialist Time Calculation (min) (ACUTE ONLY) 15 min   Pt semi-supine in bed on RA upon arrival. PT completes bed mobility indep with extra time and use of bed features. Pt STS and ambulates from bedside to recliner CGA with no LOB noted. Pt left in recliner with needs in reach and chair alarm activated.   Terrilyn Saver  Mobility Specialist  08/10/23 8:24 AM

## 2023-08-11 DIAGNOSIS — E871 Hypo-osmolality and hyponatremia: Secondary | ICD-10-CM | POA: Diagnosis not present

## 2023-08-11 MED ORDER — ATENOLOL 25 MG PO TABS: 12.5000 mg | ORAL_TABLET | Freq: Every day | ORAL | 0 refills | Status: DC

## 2023-08-11 MED ORDER — BACLOFEN 5 MG PO TABS: 5.0000 mg | ORAL_TABLET | Freq: Three times a day (TID) | ORAL | 0 refills | Status: DC

## 2023-08-11 NOTE — TOC Progression Note (Signed)
Transition of Care (TOC) - Progression Note    Patient Details  Name: Douglas Peterson. MRN: [id number] Date of Birth: 1951-06-19  Transition of Care [facility]) CM/SW Contact  Marlowe Sax, RN Phone Number: 08/11/2023, 11:22 AM  Clinical Narrative:     The patient has decided that he does not want to go out of town to go to [employer] he wants to go home with [facility]   Expected Discharge Plan: Home w Home Health Services    Expected Discharge Plan and Services   Discharge Planning Services: CM Consult Post Acute Care Choice: Home Health Living arrangements for the past 2 months: Single Family Home                 DME Arranged: N/A DME Agency: NA       HH Arranged: PT HH Agency: [facility] Date HH Agency Contacted: 08/10/23 Time HH Agency Contacted: 1216 Representative spoke with at [facility] Agency: [place]   Social Determinants of Health (SDOH) Interventions SDOH Screenings   Food Insecurity: No Food Insecurity (08/07/2023)  Housing: Low Risk  (08/07/2023)  Transportation Needs: No Transportation Needs (08/07/2023)  Utilities: Not At Risk (08/07/2023)  Financial Resource Strain: Unknown (06/13/2019)  Physical Activity: Unknown (06/13/2019)  Social Connections: Unknown (06/13/2019)  Stress: Unknown (06/13/2019)  Tobacco Use: Medium Risk (08/07/2023)    Readmission Risk Interventions    06/17/2023    4:23 PM 04/29/2022   10:26 AM 03/14/2022   12:48 PM  Readmission Risk Prevention Plan  Transportation Screening Complete Complete Complete  PCP or Specialist Appt within 3-5 Days   Complete  HRI or Home Care Consult Complete Complete Complete  Social Work Consult for Recovery Care Planning/Counseling Complete Complete Complete  Palliative Care Screening Not Applicable Not Applicable Not Applicable  Medication Review Oceanographer) Complete Complete Complete

## 2023-08-11 NOTE — TOC Progression Note (Addendum)
Transition of Care (TOC) - Progression Note    Patient Details  Name: Douglas Peterson. MRN: [id number] Date of Birth: [birth year]  Transition of Care [facility]) CM/SW Contact  Marlowe Sax, RN Phone Number: 08/11/2023, 10:45 AM  Clinical Narrative:     Met with the patient in the room at the bedside,  I explained that there are no local bed offers. I explained HH will only come to his house a couple of times a week and will not stay there a long period each time, He mentioned Hiring someone, I explained there are several options for PCS services, and that Insurance does not cover them he stated agreement to expand the bedsearch to [place], expanded the bedsearch thruout the hub, will review bed offers once obtained  Expected Discharge Plan: Home w Home Health Services    Expected Discharge Plan and Services   Discharge Planning Services: CM Consult Post Acute Care Choice: Home Health Living arrangements for the past 2 months: Single Family Home                 DME Arranged: N/A DME Agency: NA       HH Arranged: PT HH Agency: [facility] Date HH Agency Contacted: 08/10/23 Time HH Agency Contacted: 1216 Representative spoke with at [facility] Agency: [place]   Social Determinants of Health (SDOH) Interventions SDOH Screenings   Food Insecurity: No Food Insecurity (08/07/2023)  Housing: Low Risk  (08/07/2023)  Transportation Needs: No Transportation Needs (08/07/2023)  Utilities: Not At Risk (08/07/2023)  Financial Resource Strain: Unknown (06/13/2019)  Physical Activity: Unknown (06/13/2019)  Social Connections: Unknown (06/13/2019)  Stress: Unknown (06/13/2019)  Tobacco Use: Medium Risk (08/07/2023)    Readmission Risk Interventions    06/17/2023    4:23 PM 04/29/2022   10:26 AM 03/14/2022   12:48 PM  Readmission Risk Prevention Plan  Transportation Screening Complete Complete Complete  PCP or Specialist Appt within 3-5 Days   Complete  HRI or Home Care Consult  Complete Complete Complete  Social Work Consult for Recovery Care Planning/Counseling Complete Complete Complete  Palliative Care Screening Not Applicable Not Applicable Not Applicable  Medication Review Oceanographer) Complete Complete Complete

## 2023-08-11 NOTE — Progress Notes (Signed)
PT Cancellation Note  Patient Details Name: Douglas Peterson. MRN: [id number] DOB: 12-27-1950   Cancelled Treatment:    Reason Eval/Treat Not Completed: Other (comment) (Pt reports recent bowel distress, does not feel up to PT session at this time.)  9:59 AM, 08/11/23 Rosamaria Lints, PT, DPT Physical Therapist - [facility] [place]  [phone] (ASCOM)    [name] C 08/11/2023, 9:59 AM

## 2023-08-11 NOTE — Discharge Summary (Signed)
Physician Discharge Summary   Patient: Douglas Peterson. MRN: [id number]  DOB: 1951/05/01   Admit:     Date of Admission: 08/06/2023 Admitted from: home   Discharge: Date of discharge: 08/11/23 Disposition: Home health Condition at discharge: fair  CODE STATUS: FULL CODE     Discharge Physician: Sunnie Nielsen, DO [facility]     PCP: Pcp, No  Recommendations for Outpatient Follow-up:  Follow up with PCP in 1 weeks Please obtain labs/tests: BMP in 1 week Please follow up on the following pending results: none PCP AND OTHER OUTPATIENT PROVIDERS: SEE BELOW FOR SPECIFIC DISCHARGE INSTRUCTIONS PRINTED FOR PATIENT IN ADDITION TO GENERIC AVS PATIENT INFO    Discharge Instructions     Diet - low sodium heart healthy   Complete by: As directed    Discharge wound care:   Complete by: As directed    Keep buttock area cushioned and dry to avoid worsening pressure wounds   Increase activity slowly   Complete by: As directed          Discharge Diagnoses: Principal Problem:   Hyponatremia Active Problems:   Alcohol intoxication (HCC)   Dyslipidemia   Essential hypertension   BPH (benign prostatic hyperplasia)   GERD without esophagitis       Hospital Course:  HPI: Douglas Peterson. is a 72 y.o. 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   Hospital course / significant events:  10/02: to ED 10/03: Hyponatremia 116, admitted early AM to hospitalist service  10/04: sodium as below  10/05: more somnolent this morning, question medication effect, broaden workup if not more alert this afternoon but he is oriented --> more alert this afternoon  10/06: placement  pending (today is Sunday)  10/07: placement pending, working w/ PT/OT here  10/08: now declining SNF and wants home w/ HH, d/c home   Date/Time Sodium  Intervention   10/01 128   10/03 -----   01:30  116 NS 100 mL/h  05:00 119 Reduced NS to 75 mL/h  09:30 124 Very close to 24h goal already, Stop NS and start D5W to prevent/reverse rapid correction  14:00 123   18:30 121   10/04 -----   00:00 120 GOAL SODIUM MAX 124-126 based on initial level  02:30 121 Stop D5W - off fluids ordered 07:00  10/05 -----   01:30 129   09:00 128 Off fluids >24h   13:00 130   10/06 ---   15:00 134               Consultants:  none  Procedures/Surgeries: none      ASSESSMENT & PLAN:   Hyponatremia - acute on chronic - improved   Likely secondary to alcohol-related potomania and chronic alcohol use . Monitor BMP    Somnolence - improved  Likely medication effect Have d/c ativan  Have d/c trazodone, reduced baclofen  Avoid opiates   Alcohol intoxication High risk EtOH withdrawal  High dose thiamine 100 mg IV daily x3 days for prevention Wernicke  as needed Ativan for withdrawal  CIWA protocol    Dyslipidemia statin   Essential hypertension - normotensive / soft BP here  Restarted lower dose atenolol   BPH (benign prostatic hyperplasia) continue home Flomax.  GERD without esophagitis Continue home protonix                    Discharge Instructions  Allergies as of 08/11/2023       Reactions   Hydrochlorothiazide Other (See Comments)   Hyponatremia        Medication List     TAKE these medications    acetaminophen 500 MG tablet Commonly known as: TYLENOL Take 2 tablets (1,000 mg total) by mouth every 8 (eight) hours as needed for mild pain or moderate pain.   atenolol 25 MG tablet Commonly known as: TENORMIN Take 0.5 tablets (12.5 mg total) by mouth daily. What changed: how much to take   atorvastatin 10 MG tablet Commonly known as:  LIPITOR Take 1 tablet (10 mg total) by mouth daily.   Baclofen 5 MG Tabs Take 1 tablet (5 mg total) by mouth 3 (three) times daily. What changed:  medication strength how much to take   feeding supplement Liqd Take 237 mLs by mouth 2 (two) times daily between meals.   folic acid 1 MG tablet Commonly known as: FOLVITE Take 1 tablet (1 mg total) by mouth daily.   melatonin 5 MG Tabs Take 1 tablet (5 mg total) by mouth at bedtime.   multivitamin with minerals Tabs tablet Take 1 tablet by mouth daily.   ondansetron 4 MG tablet Commonly known as: ZOFRAN Take 1 tablet (4 mg total) by mouth every 6 (six) hours as needed for up to 10 days for nausea.   pantoprazole 40 MG tablet Commonly known as: PROTONIX Take 1 tablet (40 mg total) by mouth daily.   polyethylene glycol 17 g packet Commonly known as: MIRALAX / GLYCOLAX Take 17 g by mouth daily as needed. Mix one tablespoon with 8oz of your favorite juice or water every day until you are having soft formed stools. Then start taking once daily if you didn't have a stool the day before.   sucralfate 1 g tablet Commonly known as: CARAFATE Take 1 tablet (1 g total) by mouth 4 (four) times daily -  with meals and at bedtime.   tamsulosin 0.4 MG Caps capsule Commonly known as: FLOMAX Take 1 capsule (0.4 mg total) by mouth daily.   thiamine 100 MG tablet Commonly known as: VITAMIN B1 Take 1 tablet (100 mg total) by mouth daily.               Discharge Care Instructions  (From admission, onward)           Start     Ordered   08/11/23 0000  Discharge wound care:        08/11/23 1300              Allergies  Allergen Reactions   Hydrochlorothiazide Other (See Comments)    Hyponatremia     Subjective: pt reports feeling okay this mornig, no concerns, denies CP/SOB, HA/VC, N/V, dizziness, new focal weakness    Discharge Exam: BP (!) 142/88 (BP Location: Left Arm)   Pulse 65   Temp 98.2 F (36.8 C)    Resp 14   Ht 5\' 6"  (1.676 m)   Wt 63.5 kg   SpO2 95%   BMI 22.60 kg/m  General: Pt is alert, awake, not in acute distress Cardiovascular: RRR, S1/S2 +, no rubs, no gallops Respiratory: CTA bilaterally, no wheezing, no rhonchi Abdominal: Soft, NT, ND, bowel sounds + Extremities: no edema, no cyanosis     The results  of significant diagnostics from this hospitalization (including imaging, microbiology, ancillary and laboratory) are listed below for reference.     Microbiology: Recent Results (from the past 240 hour(s))  Resp panel by RT-PCR (RSV, Flu A&B, Covid) Anterior Nasal Swab     Status: None   Collection Time: 08/04/23  1:43 AM   Specimen: Anterior Nasal Swab  Result Value Ref Range Status   SARS Coronavirus 2 by RT PCR NEGATIVE NEGATIVE Final    Comment: (NOTE) SARS-CoV-2 target nucleic acids are NOT DETECTED.  The SARS-CoV-2 RNA is generally detectable in upper respiratory specimens during the acute phase of infection. The lowest concentration of SARS-CoV-2 viral copies this assay can detect is 138 copies/mL. A negative result does not preclude SARS-Cov-2 infection and should not be used as the sole basis for treatment or other patient management decisions. A negative result may occur with  improper specimen collection/handling, submission of specimen other than nasopharyngeal swab, presence of viral mutation(s) within the areas targeted by this assay, and inadequate number of viral copies(<138 copies/mL). A negative result must be combined with clinical observations, patient history, and epidemiological information. The expected result is Negative.  Fact Sheet for Patients:  BloggerCourse.com  Fact Sheet for Healthcare Providers:  SeriousBroker.it  This test is no t yet approved or cleared by the [place] FDA and  has been authorized for detection and/or diagnosis of SARS-CoV-2 by FDA under an Emergency Use  Authorization (EUA). This EUA will remain  in effect (meaning this test can be used) for the duration of the COVID-19 declaration under Section 564(b)(1) of the Act, 21 U.S.C.section 360bbb-3(b)(1), unless the authorization is terminated  or revoked sooner.       Influenza A by PCR NEGATIVE NEGATIVE Final   Influenza B by PCR NEGATIVE NEGATIVE Final    Comment: (NOTE) The Xpert Xpress SARS-CoV-2/FLU/RSV plus assay is intended as an aid in the diagnosis of influenza from Nasopharyngeal swab specimens and should not be used as a sole basis for treatment. Nasal washings and aspirates are unacceptable for Xpert Xpress SARS-CoV-2/FLU/RSV testing.  Fact Sheet for Patients: BloggerCourse.com  Fact Sheet for Healthcare Providers: SeriousBroker.it  This test is not yet approved or cleared by the [place] FDA and has been authorized for detection and/or diagnosis of SARS-CoV-2 by FDA under an Emergency Use Authorization (EUA). This EUA will remain in effect (meaning this test can be used) for the duration of the COVID-19 declaration under Section 564(b)(1) of the Act, 21 U.S.C. section 360bbb-3(b)(1), unless the authorization is terminated or revoked.     Resp Syncytial Virus by PCR NEGATIVE NEGATIVE Final    Comment: (NOTE) Fact Sheet for Patients: BloggerCourse.com  Fact Sheet for Healthcare Providers: SeriousBroker.it  This test is not yet approved or cleared by the [place] FDA and has been authorized for detection and/or diagnosis of SARS-CoV-2 by FDA under an Emergency Use Authorization (EUA). This EUA will remain in effect (meaning this test can be used) for the duration of the COVID-19 declaration under Section 564(b)(1) of the Act, 21 U.S.C. section 360bbb-3(b)(1), unless the authorization is terminated or revoked.  Performed at [facility], [address] [place], [place] [address]      Labs: BNP (last 3 results) Recent Labs    06/16/23 1410 08/04/23 0229  BNP 6.8 22.0   Basic Metabolic Panel: Recent Labs  Lab 08/06/23 0123 08/06/23 0504 08/06/23 0936 08/08/23 0134 08/08/23 0859 08/08/23 1257 08/09/23 1458 08/10/23 0348  NA 116* 119*   < >  129* 128* 130* 134* 135  K 3.9 3.9  --   --   --  3.9 3.6 4.2  CL 80* 85*  --   --   --  97* 97* 98  CO2 23 23  --   --   --  21* 25 26  GLUCOSE 78 68*  --   --   --  99 116* 109*  BUN <5* <5*  --   --   --  6* 12 17  CREATININE 0.36* 0.47*  --   --   --  0.47* 0.66 0.65  CALCIUM 8.1* 7.9*  --   --   --  9.0 9.4 9.3  MG 1.8  --   --   --   --   --   --   --   PHOS 2.7  --   --   --   --   --   --   --    < > = values in this interval not displayed.   Liver Function Tests: Recent Labs  Lab 08/06/23 0123 08/08/23 1257  AST 28 32  ALT 31 29  ALKPHOS 80 73  BILITOT 1.9* 0.9  PROT 7.1 7.2  ALBUMIN 4.1 4.0   Recent Labs  Lab 08/06/23 0123  LIPASE 25   No results for input(s): "AMMONIA" in the last 168 hours. CBC: Recent Labs  Lab 08/06/23 0123 08/06/23 0504 08/08/23 1417  WBC 12.7* 10.9* 5.7  NEUTROABS 4.5  --   --   HGB 13.6 13.5 14.9  HCT 37.8* 37.9* 41.1  MCV 87.7 88.3 87.4  PLT 165 165 195   Cardiac Enzymes: No results for input(s): "CKTOTAL", "CKMB", "CKMBINDEX", "TROPONINI" in the last 168 hours. BNP: Invalid input(s): "POCBNP" CBG: Recent Labs  Lab 08/05/23 0058  GLUCAP 99   D-Dimer No results for input(s): "DDIMER" in the last 72 hours. Hgb A1c No results for input(s): "HGBA1C" in the last 72 hours. Lipid Profile No results for input(s): "CHOL", "HDL", "LDLCALC", "TRIG", "CHOLHDL", "LDLDIRECT" in the last 72 hours. Thyroid function studies No results for input(s): "TSH", "T4TOTAL", "T3FREE", "THYROIDAB" in the last 72 hours.  Invalid input(s): "FREET3" Anemia work up No results for input(s): "VITAMINB12", "FOLATE", "FERRITIN", "TIBC",  "IRON", "RETICCTPCT" in the last 72 hours. Urinalysis    Component Value Date/Time   COLORURINE YELLOW (A) 08/04/2023 0243   APPEARANCEUR CLEAR (A) 08/04/2023 0243   LABSPEC 1.018 08/04/2023 0243   PHURINE 5.0 08/04/2023 0243   GLUCOSEU NEGATIVE 08/04/2023 0243   HGBUR NEGATIVE 08/04/2023 0243   BILIRUBINUR NEGATIVE 08/04/2023 0243   KETONESUR NEGATIVE 08/04/2023 0243   PROTEINUR NEGATIVE 08/04/2023 0243   NITRITE NEGATIVE 08/04/2023 0243   LEUKOCYTESUR NEGATIVE 08/04/2023 0243   Sepsis Labs Recent Labs  Lab 08/06/23 0123 08/06/23 0504 08/08/23 1417  WBC 12.7* 10.9* 5.7   Microbiology Recent Results (from the past 240 hour(s))  Resp panel by RT-PCR (RSV, Flu A&B, Covid) Anterior Nasal Swab     Status: None   Collection Time: 08/04/23  1:43 AM   Specimen: Anterior Nasal Swab  Result Value Ref Range Status   SARS Coronavirus 2 by RT PCR NEGATIVE NEGATIVE Final    Comment: (NOTE) SARS-CoV-2 target nucleic acids are NOT DETECTED.  The SARS-CoV-2 RNA is generally detectable in upper respiratory specimens during the acute phase of infection. The lowest concentration of SARS-CoV-2 viral copies this assay can detect is 138 copies/mL. A negative result does not preclude SARS-Cov-2 infection and should not  be used as the sole basis for treatment or other patient management decisions. A negative result may occur with  improper specimen collection/handling, submission of specimen other than nasopharyngeal swab, presence of viral mutation(s) within the areas targeted by this assay, and inadequate number of viral copies(<138 copies/mL). A negative result must be combined with clinical observations, patient history, and epidemiological information. The expected result is Negative.  Fact Sheet for Patients:  BloggerCourse.com  Fact Sheet for Healthcare Providers:  SeriousBroker.it  This test is no t yet approved or cleared by the  [place] FDA and  has been authorized for detection and/or diagnosis of SARS-CoV-2 by FDA under an Emergency Use Authorization (EUA). This EUA will remain  in effect (meaning this test can be used) for the duration of the COVID-19 declaration under Section 564(b)(1) of the Act, 21 U.S.C.section 360bbb-3(b)(1), unless the authorization is terminated  or revoked sooner.       Influenza A by PCR NEGATIVE NEGATIVE Final   Influenza B by PCR NEGATIVE NEGATIVE Final    Comment: (NOTE) The Xpert Xpress SARS-CoV-2/FLU/RSV plus assay is intended as an aid in the diagnosis of influenza from Nasopharyngeal swab specimens and should not be used as a sole basis for treatment. Nasal washings and aspirates are unacceptable for Xpert Xpress SARS-CoV-2/FLU/RSV testing.  Fact Sheet for Patients: BloggerCourse.com  Fact Sheet for Healthcare Providers: SeriousBroker.it  This test is not yet approved or cleared by the [place] FDA and has been authorized for detection and/or diagnosis of SARS-CoV-2 by FDA under an Emergency Use Authorization (EUA). This EUA will remain in effect (meaning this test can be used) for the duration of the COVID-19 declaration under Section 564(b)(1) of the Act, 21 U.S.C. section 360bbb-3(b)(1), unless the authorization is terminated or revoked.     Resp Syncytial Virus by PCR NEGATIVE NEGATIVE Final    Comment: (NOTE) Fact Sheet for Patients: BloggerCourse.com  Fact Sheet for Healthcare Providers: SeriousBroker.it  This test is not yet approved or cleared by the [place] FDA and has been authorized for detection and/or diagnosis of SARS-CoV-2 by FDA under an Emergency Use Authorization (EUA). This EUA will remain in effect (meaning this test can be used) for the duration of the COVID-19 declaration under Section 564(b)(1) of the Act, 21 U.S.C. section  360bbb-3(b)(1), unless the authorization is terminated or revoked.  Performed at [facility], [address], [place], [place] [address]    Imaging No results found.    Time coordinating discharge: over 30 minutes  SIGNED:  Sunnie Nielsen DO [facility]

## 2023-08-11 NOTE — Plan of Care (Signed)
  Problem: Education: Goal: Knowledge of General Education information will improve Description: Including pain rating scale, medication(s)/side effects and non-pharmacologic comfort measures Outcome: Progressing   Problem: Activity: Goal: Risk for activity intolerance will decrease Outcome: Progressing   Problem: Elimination: Goal: Will not experience complications related to urinary retention Outcome: Progressing   Problem: Safety: Goal: Ability to remain free from injury will improve Outcome: Progressing   Problem: Skin Integrity: Goal: Risk for impaired skin integrity will decrease Outcome: Progressing   

## 2023-08-16 ENCOUNTER — Other Ambulatory Visit: Payer: Self-pay

## 2023-08-16 ENCOUNTER — Inpatient Hospital Stay
Admission: EM | Admit: 2023-08-16 | Discharge: 2023-08-28 | DRG: 896 | Disposition: A | Discharge status: Skilled Nursing Facility | Payer: Medicare HMO | Attending: Obstetrics and Gynecology | Admitting: Obstetrics and Gynecology

## 2023-08-16 ENCOUNTER — Emergency Department: Payer: Medicare HMO

## 2023-08-16 DIAGNOSIS — F10920 Alcohol use, unspecified with intoxication, uncomplicated: Secondary | ICD-10-CM | POA: Diagnosis not present

## 2023-08-16 DIAGNOSIS — Z515 Encounter for palliative care: Secondary | ICD-10-CM

## 2023-08-16 DIAGNOSIS — F10239 Alcohol dependence with withdrawal, unspecified: Secondary | ICD-10-CM | POA: Diagnosis not present

## 2023-08-16 DIAGNOSIS — E860 Dehydration: Secondary | ICD-10-CM | POA: Diagnosis present

## 2023-08-16 DIAGNOSIS — F1029 Alcohol dependence with unspecified alcohol-induced disorder: Secondary | ICD-10-CM | POA: Diagnosis not present

## 2023-08-16 DIAGNOSIS — Z681 Body mass index (BMI) 19 or less, adult: Secondary | ICD-10-CM | POA: Diagnosis not present

## 2023-08-16 DIAGNOSIS — F10229 Alcohol dependence with intoxication, unspecified: Secondary | ICD-10-CM | POA: Diagnosis present

## 2023-08-16 DIAGNOSIS — N400 Enlarged prostate without lower urinary tract symptoms: Secondary | ICD-10-CM | POA: Diagnosis present

## 2023-08-16 DIAGNOSIS — G928 Other toxic encephalopathy: Secondary | ICD-10-CM | POA: Diagnosis present

## 2023-08-16 DIAGNOSIS — W1830XA Fall on same level, unspecified, initial encounter: Secondary | ICD-10-CM | POA: Diagnosis present

## 2023-08-16 DIAGNOSIS — G8111 Spastic hemiplegia affecting right dominant side: Secondary | ICD-10-CM | POA: Diagnosis present

## 2023-08-16 DIAGNOSIS — T510X1A Toxic effect of ethanol, accidental (unintentional), initial encounter: Secondary | ICD-10-CM | POA: Diagnosis present

## 2023-08-16 DIAGNOSIS — E871 Hypo-osmolality and hyponatremia: Principal | ICD-10-CM | POA: Diagnosis present

## 2023-08-16 DIAGNOSIS — E43 Unspecified severe protein-calorie malnutrition: Secondary | ICD-10-CM | POA: Insufficient documentation

## 2023-08-16 DIAGNOSIS — Z9049 Acquired absence of other specified parts of digestive tract: Secondary | ICD-10-CM

## 2023-08-16 DIAGNOSIS — Y908 Blood alcohol level of 240 mg/100 ml or more: Secondary | ICD-10-CM | POA: Diagnosis present

## 2023-08-16 DIAGNOSIS — I110 Hypertensive heart disease with heart failure: Secondary | ICD-10-CM | POA: Diagnosis present

## 2023-08-16 DIAGNOSIS — E785 Hyperlipidemia, unspecified: Secondary | ICD-10-CM | POA: Diagnosis present

## 2023-08-16 DIAGNOSIS — I5032 Chronic diastolic (congestive) heart failure: Secondary | ICD-10-CM | POA: Diagnosis present

## 2023-08-16 DIAGNOSIS — F10929 Alcohol use, unspecified with intoxication, unspecified: Secondary | ICD-10-CM | POA: Diagnosis present

## 2023-08-16 DIAGNOSIS — F10288 Alcohol dependence with other alcohol-induced disorder: Principal | ICD-10-CM | POA: Diagnosis present

## 2023-08-16 DIAGNOSIS — R1312 Dysphagia, oropharyngeal phase: Secondary | ICD-10-CM | POA: Diagnosis present

## 2023-08-16 DIAGNOSIS — Z801 Family history of malignant neoplasm of trachea, bronchus and lung: Secondary | ICD-10-CM

## 2023-08-16 DIAGNOSIS — R296 Repeated falls: Secondary | ICD-10-CM | POA: Diagnosis present

## 2023-08-16 DIAGNOSIS — R072 Precordial pain: Secondary | ICD-10-CM | POA: Diagnosis present

## 2023-08-16 DIAGNOSIS — I480 Paroxysmal atrial fibrillation: Secondary | ICD-10-CM | POA: Diagnosis present

## 2023-08-16 DIAGNOSIS — Z87891 Personal history of nicotine dependence: Secondary | ICD-10-CM

## 2023-08-16 DIAGNOSIS — E861 Hypovolemia: Secondary | ICD-10-CM | POA: Diagnosis present

## 2023-08-16 DIAGNOSIS — K7030 Alcoholic cirrhosis of liver without ascites: Secondary | ICD-10-CM | POA: Diagnosis present

## 2023-08-16 DIAGNOSIS — Z66 Do not resuscitate: Secondary | ICD-10-CM | POA: Diagnosis not present

## 2023-08-16 DIAGNOSIS — M24541 Contracture, right hand: Secondary | ICD-10-CM | POA: Diagnosis present

## 2023-08-16 DIAGNOSIS — R7989 Other specified abnormal findings of blood chemistry: Secondary | ICD-10-CM | POA: Diagnosis not present

## 2023-08-16 DIAGNOSIS — Z888 Allergy status to other drugs, medicaments and biological substances status: Secondary | ICD-10-CM

## 2023-08-16 DIAGNOSIS — Z9889 Other specified postprocedural states: Secondary | ICD-10-CM

## 2023-08-16 DIAGNOSIS — R54 Age-related physical debility: Secondary | ICD-10-CM | POA: Diagnosis present

## 2023-08-16 DIAGNOSIS — S0083XA Contusion of other part of head, initial encounter: Secondary | ICD-10-CM | POA: Diagnosis present

## 2023-08-16 DIAGNOSIS — Z8249 Family history of ischemic heart disease and other diseases of the circulatory system: Secondary | ICD-10-CM

## 2023-08-16 DIAGNOSIS — Z9181 History of falling: Secondary | ICD-10-CM

## 2023-08-16 DIAGNOSIS — Z87820 Personal history of traumatic brain injury: Secondary | ICD-10-CM

## 2023-08-16 DIAGNOSIS — Z79899 Other long term (current) drug therapy: Secondary | ICD-10-CM

## 2023-08-16 LAB — COMPREHENSIVE METABOLIC PANEL
ALT: 46 U/L — ABNORMAL HIGH (ref 0–44)
AST: 65 U/L — ABNORMAL HIGH (ref 15–41)
Albumin: 5.5 g/dL — ABNORMAL HIGH (ref 3.5–5.0)
Alkaline Phosphatase: 94 U/L (ref 38–126)
Anion gap: 17 — ABNORMAL HIGH (ref 5–15)
BUN: <5 mg/dL — ABNORMAL LOW (ref 8–23)
CO2: 24 mmol/L (ref 22–32)
Calcium: 8.8 mg/dL — ABNORMAL LOW (ref 8.9–10.3)
Chloride: 81 mmol/L — ABNORMAL LOW (ref 98–111)
Creatinine, Ser: 0.54 mg/dL — ABNORMAL LOW (ref 0.61–1.24)
GFR, Estimated: >60 mL/min (ref >60)
Glucose, Bld: 63 mg/dL — ABNORMAL LOW (ref 70–99)
Potassium: 4.1 mmol/L (ref 3.5–5.1)
Sodium: 122 mmol/L — ABNORMAL LOW (ref 135–145)
Total Bilirubin: 1.4 mg/dL — ABNORMAL HIGH (ref 0.3–1.2)
Total Protein: 9.5 g/dL — ABNORMAL HIGH (ref 6.5–8.1)

## 2023-08-16 LAB — CBC WITH DIFFERENTIAL/PLATELET
Abs Immature Granulocytes: 0.11 10*3/uL — ABNORMAL HIGH (ref 0.00–0.07)
Basophils Absolute: 0.1 10*3/uL (ref 0.0–0.1)
Basophils Relative: 1 %
Eosinophils Absolute: 0.1 10*3/uL (ref 0.0–0.5)
Eosinophils Relative: 0 %
HCT: 46.2 % (ref 39.0–52.0)
Hemoglobin: 16 g/dL (ref 13.0–17.0)
Immature Granulocytes: 1 %
Lymphocytes Relative: 58 %
Lymphs Abs: 8.1 10*3/uL — ABNORMAL HIGH (ref 0.7–4.0)
MCH: 31.2 pg (ref 26.0–34.0)
MCHC: 34.6 g/dL (ref 30.0–36.0)
MCV: 90.1 fL (ref 80.0–100.0)
Monocytes Absolute: 0.3 10*3/uL (ref 0.1–1.0)
Monocytes Relative: 2 %
Neutro Abs: 5.2 10*3/uL (ref 1.7–7.7)
Neutrophils Relative %: 38 %
Platelets: 281 10*3/uL (ref 150–400)
RBC: 5.13 MIL/uL (ref 4.22–5.81)
RDW: 12.9 % (ref 11.5–15.5)
Smear Review: NORMAL
WBC: 13.9 10*3/uL — ABNORMAL HIGH (ref 4.0–10.5)
nRBC: 0.2 % (ref 0.0–0.2)

## 2023-08-16 LAB — SODIUM
Sodium: 128 mmol/L — ABNORMAL LOW (ref 135–145)
Sodium: 131 mmol/L — ABNORMAL LOW (ref 135–145)

## 2023-08-16 LAB — ETHANOL: Alcohol, Ethyl (B): 311 mg/dL (ref <10)

## 2023-08-16 LAB — GLUCOSE, CAPILLARY
Glucose-Capillary: 58 mg/dL — ABNORMAL LOW (ref 70–99)
Glucose-Capillary: 96 mg/dL (ref 70–99)

## 2023-08-16 MED ORDER — POLYETHYLENE GLYCOL 3350 17 G PO PACK
17.0000 g | PACK | Freq: Every day | ORAL | Status: DC | PRN
  Administered 2023-08-18 – 2023-08-20 (×2): 17 g via ORAL
  Filled 2023-08-16 (×2): qty 1

## 2023-08-16 MED ORDER — ACETAMINOPHEN 325 MG PO TABS
650.0000 mg | ORAL_TABLET | Freq: Four times a day (QID) | ORAL | Status: DC | PRN
  Administered 2023-08-17 – 2023-08-25 (×6): 650 mg via ORAL
  Filled 2023-08-16 (×6): qty 2

## 2023-08-16 MED ORDER — MELATONIN 5 MG PO TABS
5.0000 mg | ORAL_TABLET | Freq: Every evening | ORAL | Status: DC | PRN
  Administered 2023-08-20 – 2023-08-27 (×4): 5 mg via ORAL
  Filled 2023-08-16 (×5): qty 1

## 2023-08-16 MED ORDER — DEXTROSE-SODIUM CHLORIDE 5-0.9 % IV SOLN: INTRAVENOUS | Status: DC

## 2023-08-16 MED ORDER — SODIUM CHLORIDE 0.9 % IV BOLUS: 1000.0000 mL | Freq: Once | INTRAVENOUS | Status: DC

## 2023-08-16 MED ORDER — THIAMINE MONONITRATE 100 MG PO TABS
100.0000 mg | ORAL_TABLET | Freq: Every day | ORAL | Status: DC
  Administered 2023-08-17 – 2023-08-21 (×5): 100 mg via ORAL
  Filled 2023-08-16 (×5): qty 1

## 2023-08-16 MED ORDER — ATENOLOL 25 MG PO TABS
12.5000 mg | ORAL_TABLET | Freq: Every day | ORAL | Status: DC
  Administered 2023-08-17 – 2023-08-28 (×8): 12.5 mg via ORAL
  Filled 2023-08-16 (×11): qty 0.5

## 2023-08-16 MED ORDER — THIAMINE HCL 100 MG/ML IJ SOLN
100.0000 mg | Freq: Every day | INTRAMUSCULAR | Status: DC
  Administered 2023-08-22 – 2023-08-24 (×3): 100 mg via INTRAVENOUS
  Filled 2023-08-16 (×3): qty 2

## 2023-08-16 MED ORDER — TAMSULOSIN HCL 0.4 MG PO CAPS
0.4000 mg | ORAL_CAPSULE | Freq: Every day | ORAL | Status: DC
  Administered 2023-08-17 – 2023-08-28 (×9): 0.4 mg via ORAL
  Filled 2023-08-16 (×10): qty 1

## 2023-08-16 MED ORDER — LORAZEPAM 2 MG/ML IJ SOLN
1.0000 mg | INTRAMUSCULAR | Status: AC | PRN
  Administered 2023-08-16 (×2): 2 mg via INTRAVENOUS
  Administered 2023-08-17: 1 mg via INTRAVENOUS
  Administered 2023-08-17 – 2023-08-19 (×5): 2 mg via INTRAVENOUS
  Filled 2023-08-16 (×10): qty 1

## 2023-08-16 MED ORDER — ENOXAPARIN SODIUM 40 MG/0.4ML IJ SOSY
40.0000 mg | PREFILLED_SYRINGE | INTRAMUSCULAR | Status: DC
  Administered 2023-08-16 – 2023-08-27 (×12): 40 mg via SUBCUTANEOUS
  Filled 2023-08-16 (×12): qty 0.4

## 2023-08-16 MED ORDER — LORAZEPAM 1 MG PO TABS
1.0000 mg | ORAL_TABLET | ORAL | Status: AC | PRN
  Administered 2023-08-17 – 2023-08-18 (×2): 2 mg via ORAL
  Filled 2023-08-16: qty 2
  Filled 2023-08-16: qty 3
  Filled 2023-08-16: qty 2

## 2023-08-16 MED ORDER — SODIUM CHLORIDE 0.9% FLUSH
3.0000 mL | Freq: Two times a day (BID) | INTRAVENOUS | Status: DC
  Administered 2023-08-16 – 2023-08-26 (×17): 3 mL via INTRAVENOUS

## 2023-08-16 MED ORDER — ADULT MULTIVITAMIN W/MINERALS CH
1.0000 | ORAL_TABLET | Freq: Every day | ORAL | Status: DC
  Administered 2023-08-17 – 2023-08-28 (×9): 1 via ORAL
  Filled 2023-08-16 (×10): qty 1

## 2023-08-16 MED ORDER — DEXTROSE 5 % IV SOLN: INTRAVENOUS | Status: AC

## 2023-08-16 MED ORDER — THIAMINE HCL 100 MG/ML IJ SOLN: 100.0000 mg | Freq: Every day | INTRAMUSCULAR | Status: DC

## 2023-08-16 MED ORDER — ACETAMINOPHEN 650 MG RE SUPP: 650.0000 mg | Freq: Four times a day (QID) | RECTAL | Status: DC | PRN

## 2023-08-16 MED ORDER — ATORVASTATIN CALCIUM 10 MG PO TABS
10.0000 mg | ORAL_TABLET | Freq: Every day | ORAL | Status: DC
  Administered 2023-08-17 – 2023-08-28 (×9): 10 mg via ORAL
  Filled 2023-08-16 (×10): qty 1

## 2023-08-16 MED ORDER — ONDANSETRON HCL 4 MG/2ML IJ SOLN: 4.0000 mg | Freq: Four times a day (QID) | INTRAMUSCULAR | Status: DC | PRN

## 2023-08-16 MED ORDER — OXYCODONE HCL 5 MG PO TABS
5.0000 mg | ORAL_TABLET | Freq: Four times a day (QID) | ORAL | Status: DC | PRN
  Administered 2023-08-16 – 2023-08-25 (×14): 5 mg via ORAL
  Filled 2023-08-16 (×17): qty 1

## 2023-08-16 MED ORDER — SODIUM CHLORIDE 0.9 % IV BOLUS
500.0000 mL | Freq: Once | INTRAVENOUS | Status: AC
  Administered 2023-08-16: 500 mL via INTRAVENOUS

## 2023-08-16 MED ORDER — ONDANSETRON HCL 4 MG PO TABS
4.0000 mg | ORAL_TABLET | Freq: Four times a day (QID) | ORAL | Status: DC | PRN
  Administered 2023-08-28: 4 mg via ORAL
  Filled 2023-08-16: qty 1

## 2023-08-16 MED ORDER — DEXTROSE-SODIUM CHLORIDE 5-0.45 % IV SOLN: INTRAVENOUS | Status: DC

## 2023-08-16 MED ORDER — DEXTROSE 5 % IV SOLN: INTRAVENOUS | Status: DC

## 2023-08-16 MED ORDER — PANTOPRAZOLE SODIUM 40 MG PO TBEC
40.0000 mg | DELAYED_RELEASE_TABLET | Freq: Every day | ORAL | Status: DC
  Administered 2023-08-17 – 2023-08-27 (×8): 40 mg via ORAL
  Filled 2023-08-16 (×9): qty 1

## 2023-08-16 MED ORDER — SODIUM CHLORIDE 0.9 % IV SOLN
1.0000 mg | Freq: Once | INTRAVENOUS | Status: AC
  Administered 2023-08-16: 1 mg via INTRAVENOUS
  Filled 2023-08-16: qty 0.2

## 2023-08-16 NOTE — Assessment & Plan Note (Addendum)
Regular rhythm on examination with normal ventricular rate.  Not on AC due to high risk for injuries  - Monitor for signs of RVR

## 2023-08-16 NOTE — Progress Notes (Signed)
  Progress Note  Repeat sodium notable for improvement from 1 22-1 1:28 liter bolus of normal saline.  Patient passed swallow study; diet order added.  Blood sugar has improved from 58 to 96 after eating.  D5-normal saline bag already hung but will pause for now pending repeat sodium and CBG.  Plan: - Next sodium check is an approximately 3 hours - If sodium remains stable/increases, continue holding fluids given high risk for OTS - If sodium starts to decrease, can restart IV fluids  Author: Verdene Lennert, MD 08/16/2023 6:41 PM  For on call review [email or web address].

## 2023-08-16 NOTE — ED Notes (Signed)
Called lab for blood work. Can't seem to find the drawn tubes in lab

## 2023-08-16 NOTE — ED Notes (Signed)
Critical ethanol level  311

## 2023-08-16 NOTE — Assessment & Plan Note (Signed)
Patient has a history of severe alcohol use disorder coming in with a ethanol level of 311 with signs of intoxication on examination.  High risk for withdrawals.  - Telemetry monitoring - CIWA every 4 hours - Ativan as needed for evidence of withdrawals - Daily folic acid, thiamine, and multivitamin

## 2023-08-16 NOTE — ED Triage Notes (Signed)
Pt presents to ED via [facility] from home with c/o of fall and back pain due to ETOH abuse. VSS per EMS.   CBG 82

## 2023-08-16 NOTE — Assessment & Plan Note (Signed)
Per chart review, patient sas a history of diastolic heart failure with last EF of 60-65% in 2021.  At this time, he appears hypovolemic in the setting of alcohol use disorder, poor nutrition and dehydration. He is at risk for alcohol induced cardiomyopathy.   - Daily weights

## 2023-08-16 NOTE — Discharge Instructions (Signed)
SA resources added

## 2023-08-16 NOTE — Progress Notes (Signed)
[facility] Liaison note:   This patient is currently enrolled in [facility] outpatient-based palliative care.  [facility] will continue to follow for discharge disposition.    Please call for any outpatient based palliative care related questions or concerns.   [facility] Liaison [phone]

## 2023-08-16 NOTE — Assessment & Plan Note (Signed)
In the setting of chronic daily alcohol use.  Relatively stable at this time

## 2023-08-16 NOTE — Progress Notes (Addendum)
CROSS COVER NOTE  NAME: Douglas Peterson. MRN: [id number] DOB : 1951-02-11    Concern as stated by nurse / staff   Per Admitting physician: Just saw this guys sodium went up. I held the fluids earlier but he may need 1/2 saline     Pertinent findings on chart review: Patient admitted earlier with acute on chronic hyponatremia in the setting of beer potomania and poor nutrition/hydration given AUD      Latest Ref Rng & Units 08/16/2023    9:17 PM 08/16/2023    3:10 PM 08/16/2023    9:09 AM  BMP  Glucose 70 - 99 mg/dL   63   BUN 8 - 23 mg/dL   <5   Creatinine 6.29 - 1.24 mg/dL   5.28   Sodium 413 - 244 mmol/L 131  128  122   Potassium 3.5 - 5.1 mmol/L   4.1   Chloride 98 - 111 mmol/L   81   CO2 22 - 32 mmol/L   24   Calcium 8.9 - 10.3 mg/dL   8.8      Assessment and  Interventions   Assessment:  Mild overcorrection of sodium 122-> 131 in 12 hours  Plan: D5W at 50 cc/h until recheck in 6 hours and adjust as needed for goal correction of 128 to 130 x 9 a.m. on 10/14 X X

## 2023-08-16 NOTE — Assessment & Plan Note (Signed)
Per chart review, patient has a history of alcohol induced cirrhosis however his platelets today are normal, and INR was normal when checked 2 months ago and 1 year ago.  Seems he was evaluated by [facility] at 1 point and was told he does not have cirrhosis.  - Outpatient follow-up with GI

## 2023-08-16 NOTE — Assessment & Plan Note (Signed)
Secondary to acute intoxication and electrolyte derangements.  CT head, C-spine, chest and pelvis with no acute injuries.  - PT/OT

## 2023-08-16 NOTE — Assessment & Plan Note (Signed)
Acute on chronic hyponatremia in the setting of beer potomania and poor nutrition/hydration given AUD.   - 500 cc bolus followed by D5-normal saline at 100 cc/h - Sodium checks every 6 hours - Goal sodium in the next 24 hours between 128 and 130

## 2023-08-16 NOTE — TOC Initial Note (Signed)
Transition of Care (TOC) - Initial/Assessment Note    Patient Details  Name: Douglas Peterson. MRN: [id number] Date of Birth: Jul 25, 1951  Transition of Care [facility]) CM/SW Contact:    Colette Ribas, LCSWA Phone Number: 08/16/2023, 2:15 PM  Clinical Narrative:                      CSW received consult for SA resources.. Added to AVS.   Patient Goals and CMS Choice            Expected Discharge Plan and Services                                              Prior Living Arrangements/Services                       Activities of Daily Living      Permission Sought/Granted                  Emotional Assessment              Admission diagnosis:  Hyponatremia [E87.1] Patient Active Problem List   Diagnosis Date Noted   GERD without esophagitis 08/06/2023   Nausea 07/19/2023   Pressure injury of skin 07/11/2023   Alcohol abuse 07/11/2023   Abdominal pain 06/16/2023   Elevated lactic acid level 06/16/2023   Chronic diastolic CHF (congestive heart failure) (HCC) 06/16/2023   Increased urinary frequency 06/08/2023   Diarrhea 06/08/2023   Upper respiratory infection 06/04/2023   Enteritis 06/02/2023   Polyuria 06/02/2023   Dyslipidemia 05/01/2023   Alcohol dependence (HCC) 05/01/2023   Maggot infestation feet 05/11/2022   Unable to care for self 05/11/2022   Debility 04/25/2022   Weakness 04/23/2022   Epistaxis 04/23/2022   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   Generalized weakness 03/18/2022   Nondisplaced fracture of shaft of left clavicle, initial encounter for closed fracture 03/18/2022   BPH (benign prostatic hyperplasia) 03/18/2022   Clavicle fracture 03/17/2022   Fall 03/17/2022   CAP (community acquired pneumonia) 03/13/2022   Altered mental status, unspecified 02/20/2022   Hypotension 02/19/2022   Dysphonia 08/13/2021   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   Paresthesia of right upper extremity 08/13/2021    SIRS (systemic inflammatory response syndrome) (HCC) 08/05/2020   GERD (gastroesophageal reflux disease) 06/05/2020   Alcohol use disorder, severe, dependence (HCC) 05/28/2020   Elevated LFTs 04/06/2020   Paroxysmal A-fib (HCC) 03/17/2020   Primary insomnia 02/06/2020   Left inguinal hernia 01/31/2020   Encounter for competency evaluation    Depression 01/17/2020   Pancytopenia (HCC)    Hypomagnesemia    Alcoholic cirrhosis of liver without ascites (HCC)    ETOH abuse    Alcohol intoxication (HCC)    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) 09/27/2019   Hypokalemia 09/27/2019   Alcohol withdrawal delirium, acute, hyperactive (HCC) 09/21/2019   Pressure injury of ankle, stage 1 08/15/2019   Palliative care by specialist    Closed fracture of right proximal humerus 03/19/2019   Vitamin D deficiency 01/11/2019   Osteoporosis 12/22/2018   Closed nondisplaced fracture of acromial process of right scapula with routine healing 11/15/2018   Compression fracture of L2 vertebra with routine healing 11/15/2018   Delirium tremens (HCC) 03/08/2018   Essential hypertension 09/16/2017   Encephalopathy, portal  systemic (HCC) 02/27/2017   Steatohepatitis 12/03/2016   Abnormal liver enzymes 06/17/2016   Hereditary hemochromatosis (HCC) 06/17/2016   Anxiety 07/16/2015   Hyponatremia 07/09/2015   H/O traumatic brain injury 05/22/2014   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HCC) 05/22/2014   PCP:  Pcp, No Pharmacy:   [facility] [place], [place] - [address] [place] [address] Phone: [phone] Fax: [phone]  [facility] [address] [place] [address] Phone: [phone] Fax: [phone]     Social Determinants of Health (SDOH) Social History: SDOH Screenings   Food Insecurity: No Food Insecurity (08/07/2023)  Housing: Low Risk  (08/07/2023)  Transportation Needs: No Transportation Needs (08/07/2023)   Utilities: Not At Risk (08/07/2023)  Financial Resource Strain: Unknown (06/13/2019)  Physical Activity: Unknown (06/13/2019)  Social Connections: Unknown (06/13/2019)  Stress: Unknown (06/13/2019)  Tobacco Use: Medium Risk (08/16/2023)   SDOH Interventions:     Readmission Risk Interventions    06/17/2023    4:23 PM 04/29/2022   10:26 AM 03/14/2022   12:48 PM  Readmission Risk Prevention Plan  Transportation Screening Complete Complete Complete  PCP or Specialist Appt within 3-5 Days   Complete  HRI or Home Care Consult Complete Complete Complete  Social Work Consult for Recovery Care Planning/Counseling Complete Complete Complete  Palliative Care Screening Not Applicable Not Applicable Not Applicable  Medication Review Oceanographer) Complete Complete Complete
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
## 2023-08-16 NOTE — ED Provider Notes (Signed)
[facility] Provider Note    Event Date/Time   First MD Initiated Contact with Patient 08/16/23 [phone]     (approximate)   History   Fall and Alcohol Intoxication   HPI  Douglas Peterson. is a 72 y.o. male extensive past medical history well-known to this facility presents to the ER after fall.  Reported EtOH use patient does appear clinically intoxicated at this current time and is not providing additional history.     Physical Exam   Triage Vital Signs: ED Triage Vitals [08/16/23 0848]  Encounter Vitals Group     BP      Systolic BP Percentile      Diastolic BP Percentile      Pulse      Resp      Temp      Temp src      SpO2      Weight 136 lb 11 oz (62 kg)     Height 5\' 6"  (1.676 m)     Head Circumference      Peak Flow      Pain Score      Pain Loc      Pain Education      Exclude from Growth Chart     Most recent vital signs: Vitals:   08/16/23 1153  BP: (!) 152/83  Pulse: 99  Resp: 18  Temp: 98.2 F (36.8 C)  SpO2: 96%     Constitutional: Protecting airway chronically ill-appearing Eyes: Conjunctivae are normal.  Head: Contusion to the forehead Nose: No congestion/rhinnorhea. Mouth/Throat: Mucous membranes are moist.   Neck: Painless ROM.  Cardiovascular:   Good peripheral circulation. Respiratory: Normal respiratory effort.  No retractions.  Gastrointestinal: Soft and nontender.  Musculoskeletal:  no deformity Neurologic: Unable to perform complete exam due to encephalopathy and suspected EtOH abuse. Skin:  No rash noted.    ED Results / Procedures / Treatments   Labs (all labs ordered are listed, but only abnormal results are displayed) Labs Reviewed  CBC WITH DIFFERENTIAL/PLATELET - Abnormal; Notable for the following components:      Result Value   WBC 13.9 (*)    All other components within normal limits  COMPREHENSIVE METABOLIC PANEL - Abnormal; Notable for the following components:   Sodium 122 (*)     Chloride 81 (*)    Glucose, Bld 63 (*)    BUN <5 (*)    Creatinine, Ser 0.54 (*)    Calcium 8.8 (*)    Total Protein 9.5 (*)    Albumin 5.5 (*)    AST 65 (*)    ALT 46 (*)    Total Bilirubin 1.4 (*)    Anion gap 17 (*)    All other components within normal limits  ETHANOL - Abnormal; Notable for the following components:   Alcohol, Ethyl (B) 311 (*)    All other components within normal limits     EKG  RADIOLOGY Please see ED Course for my review and interpretation.  I personally reviewed all radiographic images ordered to evaluate for the above acute complaints and reviewed radiology reports and findings.  These findings were personally discussed with the patient.  Please see medical record for radiology report.    PROCEDURES:  Critical Care performed:   Procedures   MEDICATIONS ORDERED IN ED: Medications  sodium chloride 0.9 % bolus 1,000 mL (has no administration in time range)     IMPRESSION / MDM / ASSESSMENT AND PLAN /  ED COURSE  I reviewed the triage vital signs and the nursing notes.                              Differential diagnosis includes, but is not limited to, Dehydration, sepsis, pna, uti, hypoglycemia, cva, drug effect, withdrawal, encephalitis  Patient presenting to the ER for evaluation of symptoms as described above.  Based on symptoms, risk factors and considered above differential, this presenting complaint could reflect a potentially life-threatening illness therefore the patient will be placed on continuous pulse oximetry and telemetry for monitoring.  Laboratory evaluation will be sent to evaluate for the above complaints.      Clinical Course as of 08/16/23 1253  Sun Aug 16, 2023  7253 CT head on my review and interpretation without evidence of SDH or IPH. [PR]  1149 Still awaiting results of blood work. [PR]  1243 [name] significantly hyponatremic.  His radiographs are reassuring without signs of significant traumatic injury but  given his altered mental status poor compliance and chronic medical issues will consult hospitalist for admission for medical management. [PR]    Clinical Course User Index [PR] Willy Eddy, MD     FINAL CLINICAL IMPRESSION(S) / ED DIAGNOSES   Final diagnoses:  Hyponatremia  Alcohol dependence with unspecified alcohol-induced disorder [facility])     Rx / DC Orders   ED Discharge Orders     None        Note:  This document was prepared using Dragon voice recognition software and may include unintentional dictation errors.    Willy Eddy, MD 08/16/23 1253

## 2023-08-17 DIAGNOSIS — E871 Hypo-osmolality and hyponatremia: Secondary | ICD-10-CM | POA: Diagnosis not present

## 2023-08-17 LAB — CBC WITH DIFFERENTIAL/PLATELET
Abs Immature Granulocytes: 0.02 10*3/uL (ref 0.00–0.07)
Basophils Absolute: 0.1 10*3/uL (ref 0.0–0.1)
Basophils Relative: 1 %
Eosinophils Absolute: 0 10*3/uL (ref 0.0–0.5)
Eosinophils Relative: 0 %
HCT: 41.9 % (ref 39.0–52.0)
Hemoglobin: 14.8 g/dL (ref 13.0–17.0)
Immature Granulocytes: 0 %
Lymphocytes Relative: 42 %
Lymphs Abs: 3.1 10*3/uL (ref 0.7–4.0)
MCH: 31.2 pg (ref 26.0–34.0)
MCHC: 35.3 g/dL (ref 30.0–36.0)
MCV: 88.4 fL (ref 80.0–100.0)
Monocytes Absolute: 0.6 10*3/uL (ref 0.1–1.0)
Monocytes Relative: 8 %
Neutro Abs: 3.6 10*3/uL (ref 1.7–7.7)
Neutrophils Relative %: 49 %
Platelets: 219 10*3/uL (ref 150–400)
RBC: 4.74 MIL/uL (ref 4.22–5.81)
RDW: 12.7 % (ref 11.5–15.5)
WBC: 7.4 10*3/uL (ref 4.0–10.5)
nRBC: 0 % (ref 0.0–0.2)

## 2023-08-17 LAB — COMPREHENSIVE METABOLIC PANEL
ALT: 32 U/L (ref 0–44)
AST: 34 U/L (ref 15–41)
Albumin: 4.2 g/dL (ref 3.5–5.0)
Alkaline Phosphatase: 78 U/L (ref 38–126)
Anion gap: 9 (ref 5–15)
BUN: 7 mg/dL — ABNORMAL LOW (ref 8–23)
CO2: 27 mmol/L (ref 22–32)
Calcium: 8.5 mg/dL — ABNORMAL LOW (ref 8.9–10.3)
Chloride: 93 mmol/L — ABNORMAL LOW (ref 98–111)
Creatinine, Ser: 0.57 mg/dL — ABNORMAL LOW (ref 0.61–1.24)
GFR, Estimated: >60 mL/min (ref >60)
Glucose, Bld: 123 mg/dL — ABNORMAL HIGH (ref 70–99)
Potassium: 3.7 mmol/L (ref 3.5–5.1)
Sodium: 129 mmol/L — ABNORMAL LOW (ref 135–145)
Total Bilirubin: 1.6 mg/dL — ABNORMAL HIGH (ref 0.3–1.2)
Total Protein: 7.3 g/dL (ref 6.5–8.1)

## 2023-08-17 LAB — SODIUM: Sodium: 129 mmol/L — ABNORMAL LOW (ref 135–145)

## 2023-08-17 LAB — PATHOLOGIST SMEAR REVIEW

## 2023-08-17 MED ORDER — ORAL CARE MOUTH RINSE: 15.0000 mL | OROMUCOSAL | Status: DC | PRN

## 2023-08-17 NOTE — Plan of Care (Signed)

## 2023-08-17 NOTE — Hospital Course (Addendum)
72yo with h/o ETOH use d/o, chronic hyponatremia associated with beer potomania, PAF not on AC, TBI, and HTN who presented on 10/13 with ETOH intoxication and a fall.  He had acute on chronic hyponatremia.  He has had 6 ER visits and 8 admissions in the last 6 months.

## 2023-08-17 NOTE — Evaluation (Signed)
Occupational Therapy Evaluation Patient Details Name: Douglas Peterson. MRN: [id number] DOB: June 13, 1951 Today's Date: 08/17/2023   History of Present Illness 72 y.o. 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 Mult recent hospitalizations due to alcohol related issues.   Clinical Impression   Pt was seen for OT evaluation this date. Prior to hospital admission, pt was living alone with intermittent assistance from a neighbor. History obtained from chart from 1 week ago when pt was hospitalized. He was IND with ADLs and IADLs, driving and community mobile. Used a SPC for mobility. Uses LUE for tasks d/t R sided contracture at baseline.   Pt presents to acute OT demonstrating impaired ADL performance and functional mobility 2/2 lethargy, pain, weakness and balance deficits (See OT problem list for additional functional deficits). Pt was complaining of 10/10 pain in his mid back and nurse provided pain meds during session. Pt willing to attempt bed mobility to sit at EOB which required Max A. He wished to return to bed and try eating his breakfast, therefore returned to supine with Max A and scooted to [facility] with Max A x2. He required set up assist of his breakfast tray and was left with nurse in room with all needs in place. OT attempted an hour after meds were given in hopes pain and withdrawals were better managed, however pt did not arouse to verbal stimuli and was resting peacefully. Limited eval d/t pain and withdrawal symptoms. Pt would benefit from skilled OT services to address noted impairments and functional limitations (see below for any additional details) in order to maximize safety and independence while minimizing falls risk and caregiver burden. Currently recommending SNF on DC unless pain  improves.       If plan is discharge home, recommend the following: A lot of help with walking and/or transfers;A lot of help with bathing/dressing/bathroom;Supervision due to cognitive status;Assistance with cooking/housework;Assist for transportation;Help with stairs or ramp for entrance    Functional Status Assessment  Patient has had a recent decline in their functional status and demonstrates the ability to make significant improvements in function in a reasonable and predictable amount of time.  Equipment Recommendations  Other (comment) (defer to next venue)    Recommendations for Other Services       Precautions / Restrictions Precautions Precautions: Fall Restrictions Weight Bearing Restrictions: No      Mobility Bed Mobility Overal bed mobility: Needs Assistance Bed Mobility: Supine to Sit, Sit to Supine, Rolling Rolling: Supervision   Supine to sit: Max assist Sit to supine: Mod assist   General bed mobility comments: trunk assist d/t pain in severe pain; needed Max A x2 to scoot to [facility]    Transfers                          Balance Overall balance assessment: Needs assistance Sitting-balance support: Feet supported, Bilateral upper extremity supported Sitting balance-Leahy Scale: Poor Sitting balance - Comments: high pain, having withdrawals as well       Standing balance comment: not tested d/t pt's current status and high pain                           ADL either performed or assessed with clinical judgement  ADL Overall ADL's : Needs assistance/impaired Eating/Feeding: Set up Eating/Feeding Details (indicate cue type and reason): set up tray on table and opened utensils and placed in his hand to feed himself--pt having withdrawals and just beginning to awaken                                   General ADL Comments: Performs ADLs with  the LUE, and hand 2/2 severe flexor spastivity in the RUE.     Vision          Perception         Praxis         Pertinent Vitals/Pain Pain Assessment Pain Assessment: 0-10 Pain Score: 10-Worst pain ever Pain Location: middle of his back Pain Descriptors / Indicators: Sharp, Discomfort, Crying Pain Intervention(s): Monitored during session, RN gave pain meds during session, Repositioned     Extremity/Trunk Assessment Upper Extremity Assessment Upper Extremity Assessment: Generalized weakness;RUE deficits/detail RUE Deficits / Details: chronic contracture/weakness from TBI RUE: Subluxation noted;Shoulder pain with ROM       Cervical / Trunk Assessment Cervical / Trunk Assessment: Kyphotic   Communication Communication Communication: Other (comment);Difficulty communicating thoughts/reduced clarity of speech (low voice, lots of grimacing from back pain)   Cognition Arousal: Lethargic Behavior During Therapy: Flat affect                                         General Comments       Exercises     Shoulder Instructions      Home Living Family/patient expects to be discharged to:: Private residence Living Arrangements: Alone Available Help at Discharge: Neighbor;Available PRN/intermittently Type of Home: House Home Access: Stairs to enter [employer] of Steps: 3 Entrance Stairs-Rails: Can reach both Home Layout: One level     Bathroom Shower/Tub: Chief Strategy Officer: Standard     Home Equipment: Cane - single point   Additional Comments: [place] so not around to help much anymore. Per chart, but pt unable to provide clear PLOF due to lethargy      Prior Functioning/Environment Prior Level of Function : Independent/Modified Independent             Mobility Comments: SPC for ambulation ADLs Comments: pt states he is currently driving, and can cook for himself        OT Problem List: Decreased strength;Decreased range of motion;Decreased activity  tolerance;Impaired balance (sitting and/or standing);Decreased coordination;Decreased safety awareness;Impaired UE functional use;Pain      OT Treatment/Interventions: Self-care/ADL training;Therapeutic exercise;Neuromuscular education;DME and/or AE instruction;Therapeutic activities;Balance training;Canalith reposition;Patient/family education    OT Goals(Current goals can be found in the care plan section) Acute Rehab OT Goals Patient Stated Goal: reduce pain and return home OT Goal Formulation: With patient Time For Goal Achievement: 08/31/23 Potential to Achieve Goals: Fair ADL Goals Pt Will Perform Grooming: with set-up;sitting Pt Will Perform Lower Body Bathing: sit to/from stand;sitting/lateral leans;with min assist Pt Will Perform Lower Body Dressing: with min assist;sit to/from stand;sitting/lateral leans Pt Will Transfer to Toilet: bedside commode;stand pivot transfer;with min assist Pt Will Perform Toileting - Clothing Manipulation and hygiene: with min assist;sit to/from stand;sitting/lateral leans  OT Frequency: Min 1X/week    Co-evaluation              AM-PAC OT "6  Clicks" Daily Activity     Outcome Measure Help from another person eating meals?: A Little Help from another person taking care of personal grooming?: A Little Help from another person toileting, which includes using toliet, bedpan, or urinal?: A Lot Help from another person bathing (including washing, rinsing, drying)?: A Lot Help from another person to put on and taking off regular upper body clothing?: A Little Help from another person to put on and taking off regular lower body clothing?: A Lot 6 Click Score: 15   End of Session Nurse Communication: Mobility status  Activity Tolerance: Patient limited by lethargy;Patient limited by pain Patient left: in bed;with call bell/phone within reach;with bed alarm set;with nursing/sitter in room  OT Visit Diagnosis: Unsteadiness on feet (R26.81);Muscle  weakness (generalized) (M62.81);Other abnormalities of gait and mobility (R26.89)                Time: 9147-8295 OT Time Calculation (min): 12 min Charges:  OT General Charges $OT Visit: 1 Visit OT Evaluation $OT Eval Moderate Complexity: 1 Mod Calandria Mullings, OTR/L 08/17/23, 11:35 AM Belem Hintze E Kalyan Barabas 08/17/2023, 11:30 AM

## 2023-08-17 NOTE — Progress Notes (Signed)
Progress Note   Patient: Douglas Peterson. NGE:[id number] DOB: 12-05-1950 DOA: 08/16/2023     1 DOS: the patient was seen and examined on 08/17/2023   Brief hospital course: 72yo with h/o ETOH use d/o, chronic hyponatremia associated with beer potomania, PAF not on AC, TBI, and HTN who presented on 10/13 with ETOH intoxication and a fall.  He had acute on chronic hyponatremia.  He has had 6 ER visits and 8 admissions in the last 6 months.  Assessment and Plan:  Hyponatremia Acute on chronic hyponatremia in the setting of beer potomania and poor nutrition/hydration 500 cc bolus followed by D5-normal saline at 100 cc/h Reached appropriate goal sodium to 129 This is likely to be an ongoing issue for him as long as he continues to drink   Alcohol intoxication  Patient has a history of severe alcohol use disorder coming in with a ethanol level of 311 with signs of intoxication on examination High risk for withdrawals. CIWA 9-12 today, still needs monitoring and treatment PRN Ativan Daily folic acid, thiamine, and multivitamin   Elevated LFTs In the setting of chronic daily alcohol use Minimally elevated at time of admission, now normalized   Chronic diastolic CHF (congestive heart failure) (HCC) Per chart review, patient has a history of diastolic heart failure with last EF of 60-65% in 2021.   Hypovolemic on presentation in the setting of alcohol use disorder, poor nutrition and dehydration He is at risk for alcohol induced cardiomyopathy.    Paroxysmal A-fib (HCC) Regular rhythm on examination with normal ventricular rate Not on AC due to high risk for falls   Alcoholic cirrhosis of liver without ascites (HCC) Per chart review, patient has a history of alcohol induced cirrhosis however his platelets are normal, and INR was normal when checked 2 months ago and 1 year ago. Seems he was evaluated by [facility] at 1 point and was told he does not have cirrhosis. Outpatient follow-up with  GI   Ground-level fall Secondary to acute intoxication and electrolyte derangements.   CT head, C-spine, chest and pelvis with no acute injuries. PT/OT evaluations  Dyslipidemia Continue atorvastatin   Essential hypertension Continue home atenolol   BPH (benign prostatic hyperplasia) Continue Flomax  GOC  Recurrent hospitalizations and ER visits During his last hospitalization (10/3-8), he refused SNF placement and wanted to dc home with [facility] He is again recommended for SNF placement He may benefit from moving to SNF rehab soon so that he doesn't change his mind - as this is likely his best option to prevent ongoing ETOH consumption     Consultants: PT/OT TOC team  Procedures: None  Antibiotics: None  30 Day Unplanned Readmission Risk Score    Flowsheet Row ED to Hosp-Admission (Current) from 08/16/2023 in [facility] PCU  30 Day Unplanned Readmission Risk Score (%) 56.34 Filed at 08/17/2023 0801       This score is the patient's risk of an unplanned readmission within 30 days of being discharged (0 -100%). The score is based on dignosis, age, lab data, medications, orders, and past utilization.   Low:  0-14.9   Medium: 15-21.9   High: 22-29.9   Extreme: 30 and above           Subjective: He was very somnolent at the time of my evaluation.  No specific complaints.   Objective: Vitals:   08/17/23 0450 08/17/23 0820  BP: (!) 152/99 (!) 133/91  Pulse: (!) 123 99  Resp: (!) 24 [name])  21  Temp: 99.9 F (37.7 C) (!) 97.5 F (36.4 C)  SpO2: 95% 99%    Intake/Output Summary (Last 24 hours) at 08/17/2023 0839 Last data filed at 08/17/2023 0454 Gross per 24 hour  Intake 1002.16 ml  Output 500 ml  Net 502.16 ml   Filed Weights   08/16/23 0848  Weight: 62 kg    Exam:  General:  Appears calm and comfortable and is in NAD, somnolent Eyes:  EOMI, normal lids, iris ENT:  grossly normal hearing, lips & tongue, mmm Neck:  no LAD, masses or  thyromegaly Cardiovascular:  RRR, no m/r/g. No LE edema.  Respiratory:   CTA bilaterally with no wheezes/rales/rhonchi.  Normal to mildly increased respiratory effort. Abdomen:  soft, NT, ND Skin:  no rash or induration seen on limited exam Musculoskeletal:  grossly normal tone LUE/BLE, R hand contracture (chronic), no bony abnormality Psychiatric:  somnolent mood and affect, speech sparse Neurologic:  unable to effectively perform  Data Reviewed: I have reviewed the patient's lab results since admission.  Pertinent labs for today include:   Na++ 129, up from 122 Glucose 123 Bili 1.6 Normal CBC     Family Communication: None present  Disposition: Status is: Inpatient Remains inpatient appropriate because: ongoing evaluation and treatment     Time spent: 50 minutes  Unresulted Labs (From admission, onward)     Start     Ordered   08/16/23 1530  Sodium  Now then every 6 hours,   TIMED      08/16/23 1328   08/16/23 1308  Pathologist smear review  Once,   STAT        08/16/23 6578             Author: Jonah Blue, MD 08/17/2023 8:39 AM  For on call review [email or web address].

## 2023-08-17 NOTE — Evaluation (Signed)
Physical Therapy Evaluation Patient Details Name: Douglas Peterson. MRN: [id number] DOB: [birth year] Today's Date: 08/17/2023  History of Present Illness  72 y.o. 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 Mult recent hospitalizations due to alcohol related issues.    Clinical Impression  Pt received in Semi-Fowler's position and agreeable to therapy.  Pt with increased difficulty communicating with therapist during session, slurring words and mumbling throughout.  Pt also grimacing throughout due to the pain he was experiencing from his back.  Pt did however agree to performing transfer training, however required maxA from therapist to come upright.  Pt also very unsteady and would throw himself posteriorly during the STS's.  Pt situated in bed and nursing called to assist with +2 totalA scoot pt up to head of bed.  Pt left with all needs met and nursing notified of the significant pain in the back of pt.           If plan is discharge home, recommend the following: Assistance with cooking/housework;Direct supervision/assist for medications management;Direct supervision/assist for financial management;Assist for transportation;Help with stairs or ramp for entrance;Supervision due to cognitive status;Two people to help with walking and/or transfers;A lot of help with bathing/dressing/bathroom;Assistance with feeding   Can travel by private vehicle        Equipment Recommendations None recommended by PT  Recommendations for Other Services       Functional Status Assessment Patient has had a recent decline in their functional status and demonstrates the ability to make significant improvements in function in a reasonable and predictable amount of time.     Precautions / Restrictions  Precautions Precautions: Fall Restrictions Weight Bearing Restrictions: No      Mobility  Bed Mobility Overal bed mobility: Needs Assistance Bed Mobility: Supine to Sit, Sit to Supine, Rolling Rolling: Supervision   Supine to sit: Max assist Sit to supine: Mod assist   General bed mobility comments: trunk assist d/t pain in severe pain; needed Max A x2 to scoot to [facility]    Transfers Overall transfer level: Needs assistance   Transfers: Sit to/from Stand Sit to Stand: Max assist           General transfer comment: posterior lean noted    Ambulation/Gait               General Gait Details: deferred due to pt's inability to stand >30 secs  Stairs            Wheelchair Mobility     Tilt Bed    Modified Rankin (Stroke Patients Only)       Balance Overall balance assessment: Needs assistance Sitting-balance support: Feet supported, Bilateral upper extremity supported Sitting balance-Leahy Scale: Poor Sitting balance - Comments: high pain, having withdrawals as well   Standing balance support: Single extremity supported Standing balance-Leahy Scale: Zero                               Pertinent Vitals/Pain Pain Assessment Pain Assessment: Faces Faces Pain Scale: Hurts whole lot Pain Location: back Pain Descriptors / Indicators: Discomfort, Grimacing, Sharp Pain Intervention(s): Limited activity within patient's tolerance, Monitored during session, Premedicated before session, Repositioned    Home Living   Living Arrangements: Alone Available Help at Discharge:  Neighbor;Available PRN/intermittently Type of Home: House Home Access: Stairs to enter Entrance Stairs-Rails: Can reach both Entrance Stairs-Number of Steps: 3   Home Layout: One level Home Equipment: Cane - single point Additional Comments: [place] to [place] so not around to help much anymore. Per chart, but pt unable to provide clear PLOF due to lethargy     Prior Function Prior Level of Function : Independent/Modified Independent                     Extremity/Trunk Assessment   Upper Extremity Assessment Upper Extremity Assessment: Generalized weakness;RUE deficits/detail RUE Deficits / Details: chronic contracture/weakness from TBI RUE Coordination: decreased fine motor;decreased gross motor    Lower Extremity Assessment Lower Extremity Assessment: RLE deficits/detail RLE Deficits / Details: chronic weakness though less pronounced than RUE    Cervical / Trunk Assessment Cervical / Trunk Assessment: Kyphotic  Communication   Communication Communication: Other (comment);Difficulty communicating thoughts/reduced clarity of speech (low voice, lots of grimacing from back pain)  Cognition Arousal: Lethargic Behavior During Therapy: Flat affect                                            General Comments      Exercises     Assessment/Plan    PT Assessment Patient needs continued PT services  PT Problem List Decreased strength;Decreased range of motion;Decreased activity tolerance;Decreased balance;Decreased mobility;Impaired tone       PT Treatment Interventions DME instruction;Gait training;Balance training;Stair training;Therapeutic activities;Patient/family education    PT Goals (Current goals can be found in the Care Plan section)  Acute Rehab PT Goals PT Goal Formulation: Patient unable to participate in goal setting Time For Goal Achievement: 08/31/23 Potential to Achieve Goals: Fair    Frequency Min 1X/week     Co-evaluation               AM-PAC PT "6 Clicks" Mobility  Outcome Measure Help needed turning from your back to your side while in a flat bed without using bedrails?: A Lot Help needed moving from lying on your back to sitting on the side of a flat bed without using bedrails?: A Lot Help needed moving to and from a bed to a chair (including a wheelchair)?: A Lot Help  needed standing up from a chair using your arms (e.g., wheelchair or bedside chair)?: A Lot Help needed to walk in hospital room?: Total Help needed climbing 3-5 steps with a railing? : Total 6 Click Score: 10    End of Session Equipment Utilized During Treatment: Gait belt Activity Tolerance: Patient limited by pain Patient left: in bed;with call bell/phone within reach;with bed alarm set Nurse Communication: Mobility status PT Visit Diagnosis: Muscle weakness (generalized) (M62.81);Other abnormalities of gait and mobility (R26.89)    Time: 1610-9604 PT Time Calculation (min) (ACUTE ONLY): 27 min   Charges:   PT Evaluation $PT Eval Low Complexity: 1 Low PT Treatments $Therapeutic Activity: 8-22 mins PT General Charges $$ ACUTE PT VISIT: 1 Visit         Nolon Bussing, PT, DPT Physical Therapist - [facility]  08/17/23, 3:59 PM

## 2023-08-18 DIAGNOSIS — Z515 Encounter for palliative care: Secondary | ICD-10-CM

## 2023-08-18 DIAGNOSIS — E871 Hypo-osmolality and hyponatremia: Secondary | ICD-10-CM | POA: Diagnosis not present

## 2023-08-18 DIAGNOSIS — F1029 Alcohol dependence with unspecified alcohol-induced disorder: Secondary | ICD-10-CM | POA: Diagnosis not present

## 2023-08-18 LAB — BASIC METABOLIC PANEL
Anion gap: 9 (ref 5–15)
BUN: <5 mg/dL — ABNORMAL LOW (ref 8–23)
CO2: 25 mmol/L (ref 22–32)
Calcium: 8.9 mg/dL (ref 8.9–10.3)
Chloride: 96 mmol/L — ABNORMAL LOW (ref 98–111)
Creatinine, Ser: 0.46 mg/dL — ABNORMAL LOW (ref 0.61–1.24)
GFR, Estimated: >60 mL/min (ref >60)
Glucose, Bld: 105 mg/dL — ABNORMAL HIGH (ref 70–99)
Potassium: 3.5 mmol/L (ref 3.5–5.1)
Sodium: 130 mmol/L — ABNORMAL LOW (ref 135–145)

## 2023-08-18 LAB — CBC WITH DIFFERENTIAL/PLATELET
Abs Immature Granulocytes: 0.02 10*3/uL (ref 0.00–0.07)
Basophils Absolute: 0 10*3/uL (ref 0.0–0.1)
Basophils Relative: 1 %
Eosinophils Absolute: 0.2 10*3/uL (ref 0.0–0.5)
Eosinophils Relative: 3 %
HCT: 41.4 % (ref 39.0–52.0)
Hemoglobin: 14.7 g/dL (ref 13.0–17.0)
Immature Granulocytes: 0 %
Lymphocytes Relative: 39 %
Lymphs Abs: 2.9 10*3/uL (ref 0.7–4.0)
MCH: 31.5 pg (ref 26.0–34.0)
MCHC: 35.5 g/dL (ref 30.0–36.0)
MCV: 88.8 fL (ref 80.0–100.0)
Monocytes Absolute: 0.5 10*3/uL (ref 0.1–1.0)
Monocytes Relative: 7 %
Neutro Abs: 3.6 10*3/uL (ref 1.7–7.7)
Neutrophils Relative %: 50 %
Platelets: 191 10*3/uL (ref 150–400)
RBC: 4.66 MIL/uL (ref 4.22–5.81)
RDW: 12.6 % (ref 11.5–15.5)
WBC: 7.3 10*3/uL (ref 4.0–10.5)
nRBC: 0 % (ref 0.0–0.2)

## 2023-08-18 MED ORDER — LIDOCAINE 5 % EX PTCH
1.0000 | MEDICATED_PATCH | CUTANEOUS | Status: DC
  Administered 2023-08-18 – 2023-08-28 (×10): 1 via TRANSDERMAL
  Filled 2023-08-18 (×11): qty 1
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
## 2023-08-18 NOTE — Progress Notes (Addendum)
Progress Note   Patient: Douglas Peterson. ZOX:[id number] DOB: 07/20/1951 DOA: 08/16/2023     2 DOS: the patient was seen and examined on 08/18/2023   Brief hospital course: 72yo with h/o ETOH use d/o, chronic hyponatremia associated with beer potomania, PAF not on AC, TBI, and HTN who presented on 10/13 with ETOH intoxication and a fall.  He had acute on chronic hyponatremia.  He has had 6 ER visits and 8 admissions in the last 6 months.  Recommended for SNF - this would be optimal for him.  Assessment and Plan:  Hyponatremia Acute on chronic hyponatremia in the setting of beer potomania and poor nutrition/hydration 500 cc bolus followed by D5-normal saline at 100 cc/h Reached appropriate goal sodium to 129 yesterday, 130 today - stable This is likely to be an ongoing issue for him as long as he continues to drink   Alcohol intoxication  Patient has a history of severe alcohol use disorder coming in with a ethanol level of 311 with signs of intoxication on examination High risk for withdrawals. CIWA 9-12 today, still needs monitoring and treatment PRN Ativan Daily folic acid, thiamine, and multivitamin Encephalopathy noted on presentation from likely intoxication, currently likely associated with withdrawal and sedation from Ativan Anticipate cognitive clearing as withdrawal improves   Elevated LFTs In the setting of chronic daily alcohol use Minimally elevated at time of admission, now normalized   Chronic diastolic CHF (congestive heart failure) (HCC) Per chart review, patient has a history of diastolic heart failure with last EF of 60-65% in 2021.   Hypovolemic on presentation in the setting of alcohol use disorder, poor nutrition and dehydration He is at risk for alcohol induced cardiomyopathy.    Paroxysmal A-fib (HCC) Regular rhythm on examination with normal ventricular rate Not on AC due to high risk for falls   Alcoholic cirrhosis of liver without ascites (HCC) Per  chart review, patient has a history of alcohol induced cirrhosis however his platelets are normal, and INR was normal when checked 2 months ago and 1 year ago. Seems he was evaluated by [facility] at 1 point and was told he does not have cirrhosis. Outpatient follow-up with GI   Ground-level fall Secondary to acute intoxication and electrolyte derangements.   CT head, C-spine, chest and pelvis with no acute injuries. PT/OT evaluations   Dyslipidemia Continue atorvastatin   Essential hypertension Continue home atenolol   BPH (benign prostatic hyperplasia) Continue Flomax   GOC  Recurrent hospitalizations and ER visits During his last hospitalization (10/3-8), he refused SNF placement and wanted to dc home with [facility] He is again recommended for SNF placement He may benefit from moving to SNF rehab soon so that he doesn't change his mind - as this is likely his best option to prevent ongoing ETOH consumption       Consultants: PT/OT TOC team   Procedures: None   Antibiotics: None    30 Day Unplanned Readmission Risk Score    Flowsheet Row ED to Hosp-Admission (Current) from 08/16/2023 in [facility]  30 Day Unplanned Readmission Risk Score (%) 52.34 Filed at 08/18/2023 0801       This score is the patient's risk of an unplanned readmission within 30 days of being discharged (0 -100%). The score is based on dignosis, age, lab data, medications, orders, and past utilization.   Low:  0-14.9   Medium: 15-21.9   High: 22-29.9   Extreme: 30 and above  Subjective: Reports generalized pain - and then immediately lapsed back to sleep.  Otherwise unable to obtain history due to hypoersomnolence today.   Objective: Vitals:   08/18/23 1151 08/18/23 1543  BP: 122/77 (!) 150/96  Pulse: 96 96  Resp: 16 16  Temp: (!) 97.5 F (36.4 C) 98 F (36.7 C)  SpO2: 94% 98%    Intake/Output Summary (Last 24 hours) at 08/18/2023 1604 Last data filed at  08/18/2023 1159 Gross per 24 hour  Intake 240 ml  Output 1400 ml  Net -1160 ml   Filed Weights   08/16/23 0848 08/18/23 0346  Weight: 62 kg 57.4 kg    Exam:  General:  Appears calm and comfortable and is in NAD, somnolent Eyes:  EOMI, normal lids, iris ENT:  grossly normal hearing, lips & tongue, mmm Neck:  no LAD, masses or thyromegaly Cardiovascular:  RRR, no m/r/g. No LE edema.  Respiratory:   CTA bilaterally with no wheezes/rales/rhonchi.  Normal to mildly increased respiratory effort. Abdomen:  soft, NT, ND Skin:  no rash or induration seen on limited exam Musculoskeletal:  grossly normal tone LUE/BLE, R hand contracture (chronic), no bony abnormality Psychiatric:  somnolent mood and affect, speech sparse Neurologic:  unable to effectively perform  Data Reviewed: I have reviewed the patient's lab results since admission.  Pertinent labs for today include:   Na++ 130 Glucose 105 Normal CBC     Family Communication: None present; palliative care spoke with his daughter today so I will not attempt to call at this time  Disposition: Status is: Inpatient Remains inpatient appropriate because: needs placement (if willing), treating ETOH withdrawal     Time spent: 35 minutes  Unresulted Labs (From admission, onward)     Start     Ordered   Unscheduled  CBC with Differential/Platelet  Tomorrow morning,   R       Question:  Specimen collection method  Answer:  IV Team=IV Team collect   08/18/23 1604   Unscheduled  Basic metabolic panel  Tomorrow morning,   R       Question:  Specimen collection method  Answer:  IV Team=IV Team collect   08/18/23 1604             Author: Jonah Blue, MD 08/18/2023 4:04 PM  For on call review [email or web address].
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
## 2023-08-18 NOTE — NC FL2 (Signed)
[place] MEDICAID FL2 LEVEL OF CARE FORM     IDENTIFICATION  Patient Name: Douglas Peterson. Birthdate: 03/08/1951 Sex: male Admission Date (Current Location): 08/16/2023  [place] and [place] Number:  Chiropodist and Address:  [facility], [address], [place], [place] [address]      Provider Number: [id number]  Attending Physician Name and Address:  Jonah Blue, MD  Relative Name and Phone Number:  daughter [name]  [phone]    Current Level of Care: Hospital Recommended Level of Care: Skilled Nursing Facility Prior Approval Number:    Date Approved/Denied:   PASRR Number: [id number]  Discharge Plan: SNF    Current Diagnoses: Patient Active Problem List   Diagnosis Date Noted   Ground-level fall 08/16/2023   GERD without esophagitis 08/06/2023   Nausea 07/19/2023   Pressure injury of skin 07/11/2023   Alcohol abuse 07/11/2023   Abdominal pain 06/16/2023   Elevated lactic acid level 06/16/2023   Chronic diastolic CHF (congestive heart failure) (HCC) 06/16/2023   Increased urinary frequency 06/08/2023   Diarrhea 06/08/2023   Upper respiratory infection 06/04/2023   Enteritis 06/02/2023   Polyuria 06/02/2023   Dyslipidemia 05/01/2023   Alcohol dependence (HCC) 05/01/2023   Maggot infestation feet 05/11/2022   Unable to care for self 05/11/2022   Debility 04/25/2022   Weakness 04/23/2022   Epistaxis 04/23/2022   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   Generalized weakness 03/18/2022   Nondisplaced fracture of shaft of left clavicle, initial encounter for closed fracture 03/18/2022   BPH (benign prostatic hyperplasia) 03/18/2022   Clavicle fracture 03/17/2022   Fall 03/17/2022   CAP (community acquired pneumonia) 03/13/2022   Altered mental status, unspecified 02/20/2022   Hypotension 02/19/2022   Dysphonia 08/13/2021   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   Paresthesia of right upper extremity 08/13/2021    SIRS (systemic inflammatory response syndrome) (HCC) 08/05/2020   GERD (gastroesophageal reflux disease) 06/05/2020   Alcohol use disorder, severe, dependence (HCC) 05/28/2020   Elevated LFTs 04/06/2020   Paroxysmal A-fib (HCC) 03/17/2020   Primary insomnia 02/06/2020   Left inguinal hernia 01/31/2020   Encounter for competency evaluation    Depression 01/17/2020   Pancytopenia (HCC)    Hypomagnesemia    Alcoholic cirrhosis of liver without ascites (HCC)    ETOH abuse    Alcohol intoxication (HCC)    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) 09/27/2019   Hypokalemia 09/27/2019   Alcohol withdrawal delirium, acute, hyperactive (HCC) 09/21/2019   Pressure injury of ankle, stage 1 08/15/2019   Palliative care by specialist    Closed fracture of right proximal humerus 03/19/2019   Vitamin D deficiency 01/11/2019   Osteoporosis 12/22/2018   Closed nondisplaced fracture of acromial process of right scapula with routine healing 11/15/2018   Compression fracture of L2 vertebra with routine healing 11/15/2018   Delirium tremens (HCC) 03/08/2018   Essential hypertension 09/16/2017   Encephalopathy, 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   Steatohepatitis 12/03/2016   Abnormal liver enzymes 06/17/2016   Hereditary hemochromatosis (HCC) 06/17/2016   Anxiety 07/16/2015   Hyponatremia 07/09/2015   H/O traumatic brain injury 05/22/2014   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HCC) 05/22/2014    Orientation RESPIRATION BLADDER Height & Weight     Self, Time, Situation, Place  Normal Continent Weight: 126 lb 8.7 oz (57.4 kg) Height:  5\' 6"  (167.6 cm)  BEHAVIORAL SYMPTOMS/MOOD NEUROLOGICAL BOWEL NUTRITION STATUS      Continent Diet  AMBULATORY STATUS COMMUNICATION OF NEEDS  Skin   Limited Assist Verbally Normal                       Personal Care Assistance Level of Assistance  Bathing, Feeding, Dressing Bathing Assistance: Limited assistance Feeding assistance:  Independent Dressing Assistance: Limited assistance     Functional Limitations Info  Sight, Hearing, Speech Sight Info: Adequate Hearing Info: Adequate Speech Info: Adequate    SPECIAL CARE FACTORS FREQUENCY  PT (By licensed PT), OT (By licensed OT)     PT Frequency: min 4x weekly OT Frequency: min 4x weekly            Contractures Contractures Info: Not present    Additional Factors Info  Code Status, Allergies Code Status Info: full code Allergies Info: hydrochlorothiazide           Current Medications (08/18/2023):  This is the current hospital active medication list Current Facility-Administered Medications  Medication Dose Route Frequency Provider Last Rate Last Admin   acetaminophen (TYLENOL) tablet 650 mg  650 mg Oral Q6H PRN Verdene Lennert, MD   650 mg at 08/17/23 1717   Or   acetaminophen (TYLENOL) suppository 650 mg  650 mg Rectal Q6H PRN Verdene Lennert, MD       atenolol (TENORMIN) tablet 12.5 mg  12.5 mg Oral Daily Verdene Lennert, MD   12.5 mg at 08/17/23 1034   atorvastatin (LIPITOR) tablet 10 mg  10 mg Oral Daily Verdene Lennert, MD   10 mg at 08/17/23 1028   enoxaparin (LOVENOX) injection 40 mg  40 mg Subcutaneous Q24H Verdene Lennert, MD   40 mg at 08/17/23 2111   LORazepam (ATIVAN) tablet 1-4 mg  1-4 mg Oral Q1H PRN Verdene Lennert, MD   2 mg at 08/17/23 1028   Or   LORazepam (ATIVAN) injection 1-4 mg  1-4 mg Intravenous Q1H PRN Verdene Lennert, MD   2 mg at 08/18/23 0417   melatonin tablet 5 mg  5 mg Oral QHS PRN Verdene Lennert, MD       multivitamin with minerals tablet 1 tablet  1 tablet Oral Daily Verdene Lennert, MD   1 tablet at 08/17/23 1028   ondansetron (ZOFRAN) tablet 4 mg  4 mg Oral Q6H PRN Verdene Lennert, MD       Or   ondansetron (ZOFRAN) injection 4 mg  4 mg Intravenous Q6H PRN Verdene Lennert, MD       Oral care mouth rinse  15 mL Mouth Rinse PRN Andris Baumann, MD       oxyCODONE (Oxy IR/ROXICODONE) immediate release tablet 5 mg   5 mg Oral Q6H PRN Verdene Lennert, MD   5 mg at 08/18/23 0413   pantoprazole (PROTONIX) EC tablet 40 mg  40 mg Oral Daily Verdene Lennert, MD   40 mg at 08/17/23 1028   polyethylene glycol (MIRALAX / GLYCOLAX) packet 17 g  17 g Oral Daily PRN Verdene Lennert, MD       sodium chloride flush (NS) 0.9 % injection 3 mL  3 mL Intravenous Q12H Verdene Lennert, MD   3 mL at 08/17/23 2110   tamsulosin (FLOMAX) capsule 0.4 mg  0.4 mg Oral QPC breakfast Verdene Lennert, MD   0.4 mg at 08/17/23 1028   thiamine (VITAMIN B1) tablet 100 mg  100 mg Oral Daily Verdene Lennert, MD   100 mg at 08/17/23 1028   Or   thiamine (VITAMIN B1) injection 100 mg  100 mg Intravenous Daily Verdene Lennert, MD  Discharge Medications: Please see discharge summary for a list of discharge medications.  Relevant Imaging Results:  Relevant Lab Results:   Additional Information SSN: [id number]  Darolyn Rua, LCSW

## 2023-08-18 NOTE — Plan of Care (Signed)

## 2023-08-19 ENCOUNTER — Inpatient Hospital Stay: Payer: Medicare HMO

## 2023-08-19 DIAGNOSIS — Z515 Encounter for palliative care: Secondary | ICD-10-CM | POA: Diagnosis not present

## 2023-08-19 DIAGNOSIS — E871 Hypo-osmolality and hyponatremia: Secondary | ICD-10-CM | POA: Diagnosis not present

## 2023-08-19 DIAGNOSIS — F1029 Alcohol dependence with unspecified alcohol-induced disorder: Secondary | ICD-10-CM | POA: Diagnosis not present

## 2023-08-19 LAB — CBC WITH DIFFERENTIAL/PLATELET
Abs Immature Granulocytes: 0.04 10*3/uL (ref 0.00–0.07)
Basophils Absolute: 0 10*3/uL (ref 0.0–0.1)
Basophils Relative: 0 %
Eosinophils Absolute: 0.1 10*3/uL (ref 0.0–0.5)
Eosinophils Relative: 1 %
HCT: 41.4 % (ref 39.0–52.0)
Hemoglobin: 14.5 g/dL (ref 13.0–17.0)
Immature Granulocytes: 0 %
Lymphocytes Relative: 26 %
Lymphs Abs: 2.8 10*3/uL (ref 0.7–4.0)
MCH: 31.7 pg (ref 26.0–34.0)
MCHC: 35 g/dL (ref 30.0–36.0)
MCV: 90.4 fL (ref 80.0–100.0)
Monocytes Absolute: 0.7 10*3/uL (ref 0.1–1.0)
Monocytes Relative: 6 %
Neutro Abs: 7.1 10*3/uL (ref 1.7–7.7)
Neutrophils Relative %: 67 %
Platelets: 234 10*3/uL (ref 150–400)
RBC: 4.58 MIL/uL (ref 4.22–5.81)
RDW: 12.7 % (ref 11.5–15.5)
WBC: 10.8 10*3/uL — ABNORMAL HIGH (ref 4.0–10.5)
nRBC: 0 % (ref 0.0–0.2)

## 2023-08-19 LAB — BASIC METABOLIC PANEL
Anion gap: 10 (ref 5–15)
BUN: 9 mg/dL (ref 8–23)
CO2: 24 mmol/L (ref 22–32)
Calcium: 9 mg/dL (ref 8.9–10.3)
Chloride: 99 mmol/L (ref 98–111)
Creatinine, Ser: 0.6 mg/dL — ABNORMAL LOW (ref 0.61–1.24)
GFR, Estimated: >60 mL/min (ref >60)
Glucose, Bld: 89 mg/dL (ref 70–99)
Potassium: 3.7 mmol/L (ref 3.5–5.1)
Sodium: 133 mmol/L — ABNORMAL LOW (ref 135–145)

## 2023-08-19 MED ORDER — PHENOBARBITAL 32.4 MG PO TABS: 32.4000 mg | ORAL_TABLET | Freq: Three times a day (TID) | ORAL | Status: DC

## 2023-08-19 MED ORDER — ENSURE ENLIVE PO LIQD
237.0000 mL | Freq: Three times a day (TID) | ORAL | Status: DC
  Administered 2023-08-20 – 2023-08-28 (×17): 237 mL via ORAL

## 2023-08-19 MED ORDER — PHENOBARBITAL 97.2 MG PO TABS
97.2000 mg | ORAL_TABLET | Freq: Three times a day (TID) | ORAL | Status: AC
  Administered 2023-08-19 – 2023-08-21 (×5): 97.2 mg via ORAL
  Filled 2023-08-19 (×5): qty 1

## 2023-08-19 MED ORDER — LORAZEPAM 2 MG/ML IJ SOLN
1.0000 mg | INTRAMUSCULAR | Status: AC | PRN
  Administered 2023-08-19 – 2023-08-22 (×5): 2 mg via INTRAVENOUS
  Filled 2023-08-19 (×5): qty 1

## 2023-08-19 MED ORDER — PHENOBARBITAL 32.4 MG PO TABS
64.8000 mg | ORAL_TABLET | Freq: Three times a day (TID) | ORAL | Status: DC
  Administered 2023-08-21 (×2): 64.8 mg via ORAL
  Filled 2023-08-19 (×2): qty 2

## 2023-08-19 MED ORDER — LORAZEPAM 1 MG PO TABS
1.0000 mg | ORAL_TABLET | ORAL | Status: AC | PRN
  Administered 2023-08-20: 1 mg via ORAL
  Administered 2023-08-20: 2 mg via ORAL
  Administered 2023-08-21 (×2): 1 mg via ORAL
  Filled 2023-08-19: qty 2
  Filled 2023-08-19 (×3): qty 1

## 2023-08-19 NOTE — Progress Notes (Signed)
Physical Therapy Treatment Patient Details Name: Douglas Peterson. MRN: [id number] DOB: [birth year] Today's Date: 08/19/2023   History of Present Illness 72 y.o. 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 Mult recent hospitalizations due to alcohol related issues.    PT Comments  Pt found calling for help upon entry, pt reports feeling like he couldn't swallow his food, provided a drink to help but pt struggled with that as well, coughing quite a bit; nursing and MD notified. Pt showing improvement in awareness today but still very lethargic and having trouble communicating. He reports it's painful to breathe and specified his left post. thoracic area as the site of pain; nursing and MD notified. He continues to be Max A +2 at most for all mobility. He was able to stand today with +2 HHA, able to take shuffling steps forwards/backwards and to the side for ~3 feet total. Pt will benefit from continued PT services upon discharge to safely address deficits listed in patient problem list for decreased caregiver assistance and eventual return to PLOF.    If plan is discharge home, recommend the following: Assistance with cooking/housework;Direct supervision/assist for medications management;Direct supervision/assist for financial management;Assist for transportation;Help with stairs or ramp for entrance;Supervision due to cognitive status;Two people to help with walking and/or transfers;A lot of help with bathing/dressing/bathroom;Assistance with feeding   Can travel by private vehicle     No  Equipment Recommendations  None recommended by PT    Recommendations for Other Services       Precautions / Restrictions Precautions Precautions: Fall Restrictions Weight Bearing Restrictions: No     Mobility   Bed Mobility Overal bed mobility: Needs Assistance Bed Mobility: Supine to Sit, Sit to Supine     Supine to sit: Max assist, HOB elevated, Used rails Sit to supine: Mod assist, Used rails, +2 for physical assistance   General bed mobility comments: assist with trunk and LE's, could not maintain seated EOB for long 2/2 pain    Transfers Overall transfer level: Needs assistance Equipment used: 2 person hand held assist Transfers: Sit to/from Stand Sit to Stand: Max assist                Ambulation/Gait Ambulation/Gait assistance: Max assist Gait Distance (Feet): 3 Feet Assistive device: 2 person hand held assist Gait Pattern/deviations: Decreased stride length, Decreased dorsiflexion - right, Decreased dorsiflexion - left, Decreased step length - left, Decreased step length - right, Trunk flexed Gait velocity: decreased     General Gait Details: Takes shuffling steps forwards, backwards, and to the side for ~35 sec before sitting down   Stairs             Wheelchair Mobility     Tilt Bed    Modified Rankin (Stroke Patients Only)       Balance Overall balance assessment: Needs assistance Sitting-balance support: Feet supported, Bilateral upper extremity supported Sitting balance-Leahy Scale: Fair Sitting balance - Comments: intermittent assist to stay balanced   Standing balance support: Single extremity supported, During functional activity Standing balance-Leahy Scale: Zero                              Cognition Arousal: Lethargic Behavior During Therapy: Flat affect Overall Cognitive Status: Within Functional Limits  for tasks assessed                                 General Comments: Lethargic but reponds to questions with increased time, improving from prior session        Exercises      General Comments        Pertinent Vitals/Pain Pain Assessment Pain Assessment: Faces Faces Pain Scale: Hurts whole  lot Pain Location: back. Post. left thoracic Pain Descriptors / Indicators: Discomfort, Grimacing, Sharp, Constant Pain Intervention(s): Monitored during session, Limited activity within patient's tolerance, Repositioned    Home Living Family/patient expects to be discharged to:: Unsure Living Arrangements: Alone                      Prior Function            PT Goals (current goals can now be found in the care plan section) Progress towards PT goals: Progressing toward goals    Frequency    Min 1X/week      PT Plan      Co-evaluation              AM-PAC PT "6 Clicks" Mobility   Outcome Measure  Help needed turning from your back to your side while in a flat bed without using bedrails?: A Lot Help needed moving from lying on your back to sitting on the side of a flat bed without using bedrails?: A Lot Help needed moving to and from a bed to a chair (including a wheelchair)?: A Lot Help needed standing up from a chair using your arms (e.g., wheelchair or bedside chair)?: A Lot Help needed to walk in hospital room?: A Lot Help needed climbing 3-5 steps with a railing? : Total 6 Click Score: 11    End of Session Equipment Utilized During Treatment: Gait belt Activity Tolerance: Patient limited by pain Patient left: in bed;with call bell/phone within reach;with bed alarm set Nurse Communication: Mobility status;Other (comment) (difficulty with eating/drinking, pain location) PT Visit Diagnosis: Muscle weakness (generalized) (M62.81);Other abnormalities of gait and mobility (R26.89)     Time: 1117-1140 PT Time Calculation (min) (ACUTE ONLY): 23 min  Charges:                            Cecile Sheerer, SPT 08/19/23, 1:35 PM

## 2023-08-19 NOTE — Progress Notes (Signed)
Initial Nutrition Assessment  DOCUMENTATION CODES:   Severe malnutrition in context of chronic illness  INTERVENTION:   -MVI with minerals daily -Ensure Enlive po TID, each supplement provides 350 kcal and 20 grams of protein.  -Continue regular diet  NUTRITION DIAGNOSIS:   Severe Malnutrition related to chronic illness (ETOH abuse) as evidenced by moderate fat depletion, severe fat depletion, moderate muscle depletion, severe muscle depletion.  GOAL:   Patient will meet greater than or equal to 90% of their needs  MONITOR:   PO intake, Supplement acceptance  REASON FOR ASSESSMENT:   Malnutrition Screening Tool    ASSESSMENT:   Pt with h/o ETOH use d/o, chronic hyponatremia associated with beer potomania, PAF not on AC, TBI, and HTN who presented with ETOH intoxication and a fall.  Pt admitted with hyponatremia and alcohol intoxication.   Reviewed I/O's: -300 ml x 24 hours and -361 ml since admission  UOP: 300 ml x 24 hours   Pt lying in bed at time of visit. Pt awoke briefly, but wound not stay awake enough to answer questions. Observed breakfast tray; pt consumed about 25% of meal.   Reviewed wt hx; pt has experienced a 3% wt loss over the past month, which is not significant for time frame.   Palliative care following for goals of care.   Medications reviewed and include lovenox and thiamine.   Labs reviewed: Na: 133, CBGS: 58-96 (inpatient orders for glycemic control are none).    NUTRITION - FOCUSED PHYSICAL EXAM:  Flowsheet Row Most Recent Value  Orbital Region Moderate depletion  Upper Arm Region Severe depletion  Thoracic and Lumbar Region Moderate depletion  Buccal Region Severe depletion  Temple Region Moderate depletion  Clavicle Bone Region Severe depletion  Clavicle and Acromion Bone Region Severe depletion  Scapular Bone Region Severe depletion  Dorsal Hand Moderate depletion  Patellar Region Moderate depletion  Anterior Thigh Region  Moderate depletion  Posterior Calf Region Moderate depletion  Edema (RD Assessment) Mild  Hair Reviewed  Eyes Reviewed  Mouth Reviewed  Skin Reviewed  Nails Reviewed       Diet Order:   Diet Order             Diet regular Fluid consistency: Thin  Diet effective now                   EDUCATION NEEDS:   Not appropriate for education at this time  Skin:  Skin Assessment: Reviewed RN Assessment  Last BM:  Unknown  Height:   Ht Readings from Last 1 Encounters:  08/16/23 5\' 6"  (1.676 m)    Weight:   Wt Readings from Last 1 Encounters:  08/18/23 57.4 kg    Ideal Body Weight:  64.5 kg  BMI:  Body mass index is 20.42 kg/m.  Estimated Nutritional Needs:   Kcal:  1750-1950  Protein:  90-105 grams  Fluid:  > 1.7 L    Levada Schilling, RD, LDN, CDCES Registered Dietitian III Certified Diabetes Care and Education Specialist Please refer to [facility] for RD and/or RD on-call/weekend/after hours pager
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
## 2023-08-19 NOTE — Progress Notes (Signed)
PROGRESS NOTE    Douglas Peterson.  WUJ:[id number] DOB: Jun 13, 1951 DOA: 08/16/2023 PCP: Pcp, No    Brief Narrative:   72yo with h/o ETOH use d/o, chronic hyponatremia associated with beer potomania, PAF not on AC, TBI, and HTN who presented on 10/13 with ETOH intoxication and a fall.  He had acute on chronic hyponatremia.  He has had 6 ER visits and 8 admissions in the last 6 months.  Recommended for SNF - this would be optimal for him.   [name] declining SNF.  Still exhibiting signs of withdrawal.  Poor ability to swallow.  This was noted previously by SLP.  Currently recommending n.p.o.  Will need to reevaluate swallowing ability and GOC.  Patient has been unwilling or unable to participate in his own discussion.  Daughter not willing to make decisions for him and would prefer not to be contacted unless something disastrous occurs.   Assessment & Plan:   Principal Problem:   Hyponatremia Active Problems:   Alcohol intoxication (HCC)   Elevated LFTs   Chronic diastolic CHF (congestive heart failure) (HCC)   Paroxysmal A-fib (HCC)   Alcoholic cirrhosis of liver without ascites (HCC)   Ground-level fall   Hyponatremia Acute on chronic hyponatremia in the setting of beer potomania and poor nutrition/hydration 500 cc bolus followed by D5-normal saline at 100 cc/h Plan: Discontinue IV fluids.  Stable.  Likely an ongoing issue given persistent and continued alcohol use.   Alcohol intoxication  Acute alcohol withdrawal Patient has a history of severe alcohol use disorder coming in with a ethanol level of 311 with signs of intoxication on examination High risk for withdrawals. CIWA 10-15 today, still needs monitoring and treatment Plan: Oral phenobarbital taper As needed Ativan per CIWA protocol Folic acid, thiamine, MVI Reassess mentation in a.m.   Elevated LFTs In the setting of chronic daily alcohol use Minimally elevated at time of admission, now normalized   Chronic  diastolic CHF (congestive heart failure) (HCC) Per chart review, patient has a history of diastolic heart failure with last EF of 60-65% in 2021.   Hypovolemic on presentation in the setting of alcohol use disorder, poor nutrition and dehydration He is at risk for alcohol induced cardiomyopathy.    Paroxysmal A-fib (HCC) Regular rhythm on examination with normal ventricular rate Not on AC due to high risk for falls   Alcoholic cirrhosis of liver without ascites (HCC) Per chart review, patient has a history of alcohol induced cirrhosis however his platelets are normal, and INR was normal when checked 2 months ago and 1 year ago. Seems he was evaluated by [facility] at 1 point and was told he does not have cirrhosis. Outpatient follow-up with GI   Ground-level fall Secondary to acute intoxication and electrolyte derangements.   CT head, C-spine, chest and pelvis with no acute injuries. PT/OT evaluations Recommending SNF however patient is declining   Dyslipidemia Continue atorvastatin   Essential hypertension Continue home atenolol   BPH (benign prostatic hyperplasia) Continue Flomax   GOC  Recurrent hospitalizations and ER visits During his last hospitalization (10/3-8), he refused SNF placement and wanted to dc home with [facility] He is again recommended for SNF placement He may benefit from moving to SNF rehab soon so that he doesn't change his mind - as this is likely his best option to prevent ongoing ETOH consumption.  Unfortunately patient is not agreeing to SNF placement   DVT prophylaxis: SQ Lovenox Code Status: Full Family Communication: None today Disposition Plan:  Status is: Inpatient Remains inpatient appropriate because: Acute alcohol withdrawal   Level of care: Med-Surg  Consultants:  Palliative care  Procedures:  None  Antimicrobials: None   Subjective: Examined.  Appears weak and fatigued  Objective: Vitals:   08/18/23 2323 08/19/23 0359 08/19/23 0737  08/19/23 0740  BP: 118/75 (!) 158/99 (!) 171/90 127/76  Pulse: 78 93 78 72  Resp: 16 18 16    Temp: 98.7 F (37.1 C) 98.3 F (36.8 C) 98.7 F (37.1 C)   TempSrc:   Oral   SpO2: 94% 98% 99%   Weight:      Height:       No intake or output data in the 24 hours ending 08/19/23 1330 Filed Weights   08/16/23 0848 08/18/23 0346  Weight: 62 kg 57.4 kg    Examination:  General exam: Appears weak and fatigued Respiratory system: Scattered fine rales bilaterally.  Normal work of breathing.  Room air Cardiovascular system: S and S2, RRR, no murmurs, no pedal edema Gastrointestinal system: Thin, soft, NT/ND, normal bowel sounds Central nervous system: Alert.  Oriented x 2.  No focal deficits Extremities: Decreased power bilaterally Skin: No rashes, lesions or ulcers Psychiatry: Judgement and insight appear impaired. Mood & affect flattened.     Data Reviewed: I have personally reviewed following labs and imaging studies  CBC: Recent Labs  Lab 08/16/23 0909 08/17/23 0330 08/18/23 0445 08/19/23 0532  WBC 13.9* 7.4 7.3 10.8*  NEUTROABS 5.2 3.6 3.6 7.1  HGB 16.0 14.8 14.7 14.5  HCT 46.2 41.9 41.4 41.4  MCV 90.1 88.4 88.8 90.4  PLT 281 219 191 234   Basic Metabolic Panel: Recent Labs  Lab 08/16/23 0909 08/16/23 1510 08/16/23 2117 08/17/23 0330 08/17/23 0949 08/18/23 0445 08/19/23 0532  NA 122*   < > 131* 129* 129* 130* 133*  K 4.1  --   --  3.7  --  3.5 3.7  CL 81*  --   --  93*  --  96* 99  CO2 24  --   --  27  --  25 24  GLUCOSE 63*  --   --  123*  --  105* 89  BUN <5*  --   --  7*  --  <5* 9  CREATININE 0.54*  --   --  0.57*  --  0.46* 0.60*  CALCIUM 8.8*  --   --  8.5*  --  8.9 9.0   < > = values in this interval not displayed.   GFR: Estimated Creatinine Clearance: 67.8 mL/min (A) (by C-G formula based on SCr of 0.6 mg/dL (L)). Liver Function Tests: Recent Labs  Lab 08/16/23 0909 08/17/23 0330  AST 65* 34  ALT 46* 32  ALKPHOS 94 78  BILITOT 1.4* 1.6*   PROT 9.5* 7.3  ALBUMIN 5.5* 4.2   No results for input(s): "LIPASE", "AMYLASE" in the last 168 hours. No results for input(s): "AMMONIA" in the last 168 hours. Coagulation Profile: No results for input(s): "INR", "PROTIME" in the last 168 hours. Cardiac Enzymes: No results for input(s): "CKTOTAL", "CKMB", "CKMBINDEX", "TROPONINI" in the last 168 hours. BNP (last 3 results) No results for input(s): "PROBNP" in the last 8760 hours. HbA1C: No results for input(s): "HGBA1C" in the last 72 hours. CBG: Recent Labs  Lab 08/16/23 1743 08/16/23 1838  GLUCAP 58* 96   Lipid Profile: No results for input(s): "CHOL", "HDL", "LDLCALC", "TRIG", "CHOLHDL", "LDLDIRECT" in the last 72 hours. Thyroid Function Tests: No results for input(s): "  TSH", "T4TOTAL", "FREET4", "T3FREE", "THYROIDAB" in the last 72 hours. Anemia Panel: No results for input(s): "VITAMINB12", "FOLATE", "FERRITIN", "TIBC", "IRON", "RETICCTPCT" in the last 72 hours. Sepsis Labs: No results for input(s): "PROCALCITON", "LATICACIDVEN" in the last 168 hours.  No results found for this or any previous visit (from the past 240 hour(s)).       Radiology Studies: No results found.      Scheduled Meds:  atenolol  12.5 mg Oral Daily   atorvastatin  10 mg Oral Daily   enoxaparin (LOVENOX) injection  40 mg Subcutaneous Q24H   lidocaine  1 patch Transdermal Q24H   multivitamin with minerals  1 tablet Oral Daily   pantoprazole  40 mg Oral Daily   phenobarbital  97.2 mg Oral Q8H   Followed by   Melene Muller ON 08/21/2023] phenobarbital  64.8 mg Oral Q8H   Followed by   Melene Muller ON 08/23/2023] phenobarbital  32.4 mg Oral Q8H   sodium chloride flush  3 mL Intravenous Q12H   tamsulosin  0.4 mg Oral QPC breakfast   thiamine  100 mg Oral Daily   Or   thiamine  100 mg Intravenous Daily   Continuous Infusions:   LOS: 3 days     Tresa Moore, MD [facility]   If 7PM-7AM, please contact  night-coverage  08/19/2023, 1:30 PM

## 2023-08-19 NOTE — Plan of Care (Signed)

## 2023-08-20 DIAGNOSIS — E43 Unspecified severe protein-calorie malnutrition: Secondary | ICD-10-CM | POA: Insufficient documentation

## 2023-08-20 DIAGNOSIS — E871 Hypo-osmolality and hyponatremia: Secondary | ICD-10-CM | POA: Diagnosis not present

## 2023-08-20 DIAGNOSIS — F1029 Alcohol dependence with unspecified alcohol-induced disorder: Secondary | ICD-10-CM | POA: Diagnosis not present

## 2023-08-20 DIAGNOSIS — Z515 Encounter for palliative care: Secondary | ICD-10-CM | POA: Diagnosis not present

## 2023-08-20 NOTE — Plan of Care (Signed)

## 2023-08-20 NOTE — Progress Notes (Addendum)
Mobility Specialist - Progress Note   08/20/23 1500  Mobility  Activity Stood at bedside;Ambulated with assistance in room  Level of Assistance Moderate assist, patient does 50-74%  Assistive Device Front wheel walker  Distance Ambulated (ft) 5 ft  $Mobility charge 1 Mobility     Pt scooted way down in bed upon arrival, utilizing RA. States he was trying to go get help. When asked what's wrong, pt responded "my back, my back". Pt able to achieve EOB with modA. Home Place entered to assess pt. Pt stood at bedside and took several steps towards [facility] with modA before returning supine. Assist for steadying. Pt left in bed with alarm set, needs in reach. RN and CSW notified.    [name] Mobility Specialist 08/20/23, 3:16 PM

## 2023-08-20 NOTE — Progress Notes (Signed)
Physical Therapy Treatment Patient Details Name: Douglas Peterson. MRN: [id number] DOB: [birth year] Today's Date: 08/20/2023   History of Present Illness 72 y.o. 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 Mult recent hospitalizations due to alcohol related issues.    PT Comments  Pt received in Semi-Fowler's position and agreeable to therapy.  Pt able to communicate a little more effectively during the session, but is still screaming when performing transfers due to the pain in the back.  Pt has difficulty recalling therapist name throughout the session although told multiple times throughout the session.  Pt with improved stability in sitting and standing compared to the last time author saw pt, however balance is still reduced at this time.  Pt performed seated exercises following STS x3.  Pt able to laterally ambulated with HHA before returning to the bed.  Pt left with pillow under R UE for positioning and comfort.  Pt left with all needs met and call bell within reach.     If plan is discharge home, recommend the following: Assistance with cooking/housework;Direct supervision/assist for medications management;Direct supervision/assist for financial management;Assist for transportation;Help with stairs or ramp for entrance;Supervision due to cognitive status;Two people to help with walking and/or transfers;A lot of help with bathing/dressing/bathroom;Assistance with feeding   Can travel by private vehicle     No  Equipment Recommendations  None recommended by PT    Recommendations for Other Services       Precautions / Restrictions Precautions Precautions: Fall Restrictions Weight Bearing Restrictions: No     Mobility  Bed Mobility Overal bed mobility: Needs Assistance Bed Mobility: Supine  to Sit Rolling: Min assist   Supine to sit: Mod assist Sit to supine: Mod assist, Used rails        Transfers Overall transfer level: Needs assistance Equipment used: 1 person hand held assist Transfers: Sit to/from Stand, Bed to chair/wheelchair/BSC Sit to Stand: Mod assist                Ambulation/Gait   Gait Distance (Feet): 4 Feet Assistive device: 1 person hand held assist Gait Pattern/deviations: Decreased stride length, Decreased dorsiflexion - right, Decreased dorsiflexion - left, Decreased step length - left, Decreased step length - right, Trunk flexed Gait velocity: decreased     General Gait Details: Pt able to side step during treatment session, but unable to ambulate forward/backward due to imbalance.   Stairs             Wheelchair Mobility     Tilt Bed    Modified Rankin (Stroke Patients Only)       Balance Overall balance assessment: Needs assistance Sitting-balance support: Feet supported, Bilateral upper extremity supported Sitting balance-Leahy Scale: Fair     Standing balance support: Single extremity supported, During functional activity Standing balance-Leahy Scale: Poor                              Cognition Arousal: Lethargic Behavior During Therapy: WFL for tasks assessed/performed Overall Cognitive Status: Within Functional Limits for tasks assessed                                 General Comments: Lethargic but reponds  to questions with increased time        Exercises Total Joint Exercises Gluteal Sets: AROM, Strengthening, Both, 10 reps, Seated Long Arc Quad: AROM, Strengthening, Both, 10 reps, Seated    General Comments        Pertinent Vitals/Pain Pain Assessment Pain Assessment: Faces Faces Pain Scale: Hurts little more Pain Descriptors / Indicators: Discomfort, Grimacing, Sharp, Constant Pain Intervention(s): Limited activity within patient's tolerance, Monitored during  session, Premedicated before session    Home Living                          Prior Function            PT Goals (current goals can now be found in the care plan section) Acute Rehab PT Goals Patient Stated Goal: to go home PT Goal Formulation: Patient unable to participate in goal setting Time For Goal Achievement: 08/31/23 Potential to Achieve Goals: Fair Progress towards PT goals: Progressing toward goals    Frequency    Min 1X/week      PT Plan      Co-evaluation              AM-PAC PT "6 Clicks" Mobility   Outcome Measure  Help needed turning from your back to your side while in a flat bed without using bedrails?: A Lot Help needed moving from lying on your back to sitting on the side of a flat bed without using bedrails?: A Lot Help needed moving to and from a bed to a chair (including a wheelchair)?: A Lot Help needed standing up from a chair using your arms (e.g., wheelchair or bedside chair)?: A Lot Help needed to walk in hospital room?: A Lot Help needed climbing 3-5 steps with a railing? : Total 6 Click Score: 11    End of Session   Activity Tolerance: Patient limited by pain Patient left: in bed;with call bell/phone within reach;with bed alarm set Nurse Communication: Mobility status PT Visit Diagnosis: Muscle weakness (generalized) (M62.81);Other abnormalities of gait and mobility (R26.89)     Time: 1610-9604 PT Time Calculation (min) (ACUTE ONLY): 28 min  Charges:    $Therapeutic Activity: 23-37 mins PT General Charges $$ ACUTE PT VISIT: 1 Visit                     Nolon Bussing, PT, DPT Physical Therapist - [facility]  08/20/23, 4:08 PM
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
## 2023-08-20 NOTE — TOC Progression Note (Signed)
Transition of Care (TOC) - Progression Note    Patient Details  Name: Douglas Peterson. MRN: [id number] Date of Birth: 03-30-1951  Transition of Care [facility]) CM/SW Contact  Darolyn Rua, [place] Phone Number: 08/20/2023, 12:01 PM  Clinical Narrative:     CSW notes per NP patient interested in going to [facility] per their discussion, CSW did speak with Asher Muir with admission with [facility] at [phone] she reports they are going to do a bedside assessment and requested clinicals be faxed to [phone], sent.    Expected Discharge Plan: Home w Home Health Services Barriers to Discharge: Continued Medical Work up  Expected Discharge Plan and Services         Expected Discharge Date: 08/21/23                                     Social Determinants of Health (SDOH) Interventions SDOH Screenings   Food Insecurity: Food Insecurity Present (08/19/2023)  Housing: High Risk (08/19/2023)  Transportation Needs: Unmet Transportation Needs (08/19/2023)  Utilities: At Risk (08/19/2023)  Financial Resource Strain: Unknown (06/13/2019)  Physical Activity: Unknown (06/13/2019)  Social Connections: Unknown (06/13/2019)  Stress: Unknown (06/13/2019)  Tobacco Use: Medium Risk (08/16/2023)    Readmission Risk Interventions    06/17/2023    4:23 PM 04/29/2022   10:26 AM 03/14/2022   12:48 PM  Readmission Risk Prevention Plan  Transportation Screening Complete Complete Complete  PCP or Specialist Appt within 3-5 Days   Complete  HRI or Home Care Consult Complete Complete Complete  Social Work Consult for Recovery Care Planning/Counseling Complete Complete Complete  Palliative Care Screening Not Applicable Not Applicable Not Applicable  Medication Review Oceanographer) Complete Complete Complete

## 2023-08-20 NOTE — Progress Notes (Signed)
PROGRESS NOTE    Douglas Peterson.  ZOX:[id number] DOB: [birth year] DOA: 08/16/2023 PCP: Pcp, No    Brief Narrative:   72yo with h/o ETOH use d/o, chronic hyponatremia associated with beer potomania, PAF not on AC, TBI, and HTN who presented on 10/13 with ETOH intoxication and a fall.  He had acute on chronic hyponatremia.  He has had 6 ER visits and 8 admissions in the last 6 months.  Recommended for SNF - this would be optimal for him.   [name] declining SNF.  Still exhibiting signs of withdrawal.  Poor ability to swallow.  This was noted previously by SLP.  Currently recommending n.p.o.  Will need to reevaluate swallowing ability and GOC.  Patient has been unwilling or unable to participate in his own discussion.  Daughter not willing to make decisions for him and would prefer not to be contacted unless something disastrous occurs.   Assessment & Plan:   Principal Problem:   Hyponatremia Active Problems:   Alcohol intoxication (HCC)   Elevated LFTs   Chronic diastolic CHF (congestive heart failure) (HCC)   Paroxysmal A-fib (HCC)   Alcoholic cirrhosis of liver without ascites (HCC)   Ground-level fall   Protein-calorie malnutrition, severe  Hyponatremia Acute on chronic hyponatremia in the setting of beer potomania and poor nutrition/hydration 500 cc bolus followed by D5-normal saline at 100 cc/h Plan: Hyponatremia improved.  Monitor off intravenous fluids   Alcohol intoxication  Acute alcohol withdrawal Patient has a history of severe alcohol use disorder coming in with a ethanol level of 311 with signs of intoxication on examination High risk for withdrawals. CIWA 10-15 today, still needs monitoring and treatment Plan: Continue oral phenobarbital taper.  Appreciate pharmacy dosing assistance As needed Ativan per CIWA protocol Folic acid, thiamine, MVI   Elevated LFTs In the setting of chronic daily alcohol use Minimally elevated at time of admission, now  normalized   Chronic diastolic CHF (congestive heart failure) (HCC) Per chart review, patient has a history of diastolic heart failure with last EF of 60-65% in 2021.   Hypovolemic on presentation in the setting of alcohol use disorder, poor nutrition and dehydration He is at risk for alcohol induced cardiomyopathy.    Paroxysmal A-fib (HCC) Regular rhythm on examination with normal ventricular rate Not on AC due to high risk for falls   Alcoholic cirrhosis of liver without ascites (HCC) Per chart review, patient has a history of alcohol induced cirrhosis however his platelets are normal, and INR was normal when checked 2 months ago and 1 year ago. Seems he was evaluated by [facility] at 1 point and was told he does not have cirrhosis. Outpatient follow-up with GI   Ground-level fall Secondary to acute intoxication and electrolyte derangements.   CT head, C-spine, chest and pelvis with no acute injuries. PT/OT evaluations Recommending SNF.  Patient agree[facility] to [facility]   Dyslipidemia Continue atorvastatin   Essential hypertension Continue home atenolol   BPH (benign prostatic hyperplasia) Continue Flomax   GOC  Recurrent hospitalizations and ER visits During his last hospitalization (10/3-8), he refused SNF placement and wanted to dc [facility] with [facility] He is again recommended for SNF placement He is now agreeable to home [facility]  DVT prophylaxis: SQ Lovenox Code Status: Full Family Communication: None today Disposition Plan: Status is: Inpatient Remains inpatient appropriate because: Acute alcohol withdrawal   Level of care: Med-Surg  Consultants:  Palliative care  Procedures:  None  Antimicrobials: None   Subjective: Seen and  examined.  Weak and fatigued.  Objective: Vitals:   08/19/23 2341 08/20/23 0311 08/20/23 0859 08/20/23 1325  BP: 134/73 117/75 (!) 147/78 (!) 140/98  Pulse: 66 65 71 84  Resp: 16 14 16 16   Temp: (!) 97.5 F (36.4 C) 97.9 F (36.6 C) 97.7  F (36.5 C) 97.6 F (36.4 C)  TempSrc:      SpO2: 97% 96% 99% 97%  Weight:      Height:        Intake/Output Summary (Last 24 hours) at 08/20/2023 1433 Last data filed at 08/20/2023 1327 Gross per 24 hour  Intake 170 ml  Output 450 ml  Net -280 ml   Filed Weights   08/16/23 0848 08/18/23 0346  Weight: 62 kg 57.4 kg    Examination:  General exam: Appears weak and fatigued.  Appears chronically ill Respiratory system: Bibasilar crackles.  Normal work of breathing.  Room air Cardiovascular system: S and S2, RRR, no murmurs, no pedal edema Gastrointestinal system: Thin, soft, NT/ND, normal bowel sounds Central nervous system: Alert.  Oriented x 2.  No focal deficits Extremities: Decreased power bilaterally Skin: No rashes, lesions or ulcers Psychiatry: Judgement and insight appear impaired. Mood & affect flattened.     Data Reviewed: I have personally reviewed following labs and imaging studies  CBC: Recent Labs  Lab 08/16/23 0909 08/17/23 0330 08/18/23 0445 08/19/23 0532  WBC 13.9* 7.4 7.3 10.8*  NEUTROABS 5.2 3.6 3.6 7.1  HGB 16.0 14.8 14.7 14.5  HCT 46.2 41.9 41.4 41.4  MCV 90.1 88.4 88.8 90.4  PLT 281 219 191 234   Basic Metabolic Panel: Recent Labs  Lab 08/16/23 0909 08/16/23 1510 08/16/23 2117 08/17/23 0330 08/17/23 0949 08/18/23 0445 08/19/23 0532  NA 122*   < > 131* 129* 129* 130* 133*  K 4.1  --   --  3.7  --  3.5 3.7  CL 81*  --   --  93*  --  96* 99  CO2 24  --   --  27  --  25 24  GLUCOSE 63*  --   --  123*  --  105* 89  BUN <5*  --   --  7*  --  <5* 9  CREATININE 0.54*  --   --  0.57*  --  0.46* 0.60*  CALCIUM 8.8*  --   --  8.5*  --  8.9 9.0   < > = values in this interval not displayed.   GFR: Estimated Creatinine Clearance: 67.8 mL/min (A) (by C-G formula based on SCr of 0.6 mg/dL (L)). Liver Function Tests: Recent Labs  Lab 08/16/23 0909 08/17/23 0330  AST 65* 34  ALT 46* 32  ALKPHOS 94 78  BILITOT 1.4* 1.6*  PROT 9.5* 7.3   ALBUMIN 5.5* 4.2   No results for input(s): "LIPASE", "AMYLASE" in the last 168 hours. No results for input(s): "AMMONIA" in the last 168 hours. Coagulation Profile: No results for input(s): "INR", "PROTIME" in the last 168 hours. Cardiac Enzymes: No results for input(s): "CKTOTAL", "CKMB", "CKMBINDEX", "TROPONINI" in the last 168 hours. BNP (last 3 results) No results for input(s): "PROBNP" in the last 8760 hours. HbA1C: No results for input(s): "HGBA1C" in the last 72 hours. CBG: Recent Labs  Lab 08/16/23 1743 08/16/23 1838  GLUCAP 58* 96   Lipid Profile: No results for input(s): "CHOL", "HDL", "LDLCALC", "TRIG", "CHOLHDL", "LDLDIRECT" in the last 72 hours. Thyroid Function Tests: No results for input(s): "TSH", "T4TOTAL", "FREET4", "T3FREE", "THYROIDAB"  in the last 72 hours. Anemia Panel: No results for input(s): "VITAMINB12", "FOLATE", "FERRITIN", "TIBC", "IRON", "RETICCTPCT" in the last 72 hours. Sepsis Labs: No results for input(s): "PROCALCITON", "LATICACIDVEN" in the last 168 hours.  No results found for this or any previous visit (from the past 240 hour(s)).       Radiology Studies: DG Chest Port 1 View  Result Date: 08/19/2023 CLINICAL DATA:  Cough. EXAM: PORTABLE CHEST 1 VIEW COMPARISON:  Chest radiograph 08/16/2023 FINDINGS: The cardiomediastinal silhouette is unchanged with normal heart size. Lung volumes remain low with similar appearance of mild bibasilar opacities. The upper lungs are clear. No overt pulmonary edema, sizable pleural effusion, or pneumothorax is identified. Old rib and left clavicle fractures are noted. IMPRESSION: Low lung volumes with bibasilar atelectasis. Electronically Signed   By: Sebastian Ache M.D.   On: 08/19/2023 16:17        Scheduled Meds:  atenolol  12.5 mg Oral Daily   atorvastatin  10 mg Oral Daily   enoxaparin (LOVENOX) injection  40 mg Subcutaneous Q24H   feeding supplement  237 mL Oral TID BM   lidocaine  1 patch  Transdermal Q24H   multivitamin with minerals  1 tablet Oral Daily   pantoprazole  40 mg Oral Daily   phenobarbital  97.2 mg Oral Q8H   Followed by   Melene Muller ON 08/21/2023] phenobarbital  64.8 mg Oral Q8H   Followed by   Melene Muller ON 08/23/2023] phenobarbital  32.4 mg Oral Q8H   sodium chloride flush  3 mL Intravenous Q12H   tamsulosin  0.4 mg Oral QPC breakfast   thiamine  100 mg Oral Daily   Or   thiamine  100 mg Intravenous Daily   Continuous Infusions:   LOS: 4 days     Douglas Moore, MD [facility]   If 7PM-7AM, please contact night-coverage  08/20/2023, 2:33 PM

## 2023-08-20 NOTE — Progress Notes (Signed)
Occupational Therapy Treatment Patient Details Name: Douglas Peterson. MRN: [id number] DOB: 22-Jun-1951 Today's Date: 08/20/2023   History of present illness 72 y.o. 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 Mult recent hospitalizations due to alcohol related issues.   OT comments  [name] was seen for OT treatment on this date. Upon arrival to room pt in bed, lethargic but agreeable to tx. Pt requires MOD A sit>sup. MAX A don B socks seated EOB, CGA self-drinking in sitting, LUE tremor noted. MOD A for simulated BSC t/f. Pt making progress toward goals, will continue to follow POC. Discharge recommendation remains appropriate.       If plan is discharge home, recommend the following:  A lot of help with walking and/or transfers;A lot of help with bathing/dressing/bathroom;Supervision due to cognitive status;Assistance with cooking/housework;Assist for transportation;Help with stairs or ramp for entrance   Equipment Recommendations  Other (comment) (defer)    Recommendations for Other Services      Precautions / Restrictions Precautions Precautions: Fall Restrictions Weight Bearing Restrictions: No       Mobility Bed Mobility Overal bed mobility: Needs Assistance Bed Mobility: Supine to Sit     Supine to sit: Mod assist          Transfers Overall transfer level: Needs assistance Equipment used: 1 person hand held assist Transfers: Sit to/from Stand, Bed to chair/wheelchair/BSC Sit to Stand: Mod assist     Step pivot transfers: Mod assist           Balance Overall balance assessment: Needs assistance Sitting-balance support: Feet supported, Bilateral upper extremity supported Sitting balance-Leahy Scale: Fair     Standing balance support: Single extremity supported,  During functional activity Standing balance-Leahy Scale: Poor                             ADL either performed or assessed with clinical judgement   ADL Overall ADL's : Needs assistance/impaired                                       General ADL Comments: MAX A don B socks seated EOB, CGAself-drinking in sitting, LUE tremor noted. MOD A for simulated BSC t/f      Cognition Arousal: Lethargic Behavior During Therapy: WFL for tasks assessed/performed Overall Cognitive Status: Within Functional Limits for tasks assessed                                 General Comments: Lethargic but reponds to questions with increased time                   Pertinent Vitals/ Pain       Pain Assessment Pain Assessment: Faces Pain Score: 0-No pain   Frequency  Min 1X/week        Progress Toward Goals  OT Goals(current goals can now be found in the care plan section)  Progress towards OT goals: Progressing toward goals  Acute Rehab OT Goals Patient Stated Goal: go home OT Goal Formulation: With patient Time For Goal Achievement: 08/31/23 Potential to Achieve Goals: Fair ADL Goals Pt  Will Perform Grooming: with set-up;sitting Pt Will Perform Lower Body Bathing: sit to/from stand;sitting/lateral leans;with min assist Pt Will Perform Lower Body Dressing: with min assist;sit to/from stand;sitting/lateral leans Pt Will Transfer to Toilet: bedside commode;stand pivot transfer;with min assist Pt Will Perform Toileting - Clothing Manipulation and hygiene: with min assist;sit to/from stand;sitting/lateral leans  Plan      Co-evaluation                 AM-PAC OT "6 Clicks" Daily Activity     Outcome Measure   Help from another person eating meals?: A Little Help from another person taking care of personal grooming?: A Little Help from another person toileting, which includes using toliet, bedpan, or urinal?: A Lot Help from another  person bathing (including washing, rinsing, drying)?: A Lot Help from another person to put on and taking off regular upper body clothing?: A Little Help from another person to put on and taking off regular lower body clothing?: A Lot 6 Click Score: 15    End of Session Equipment Utilized During Treatment: Gait belt  OT Visit Diagnosis: Unsteadiness on feet (R26.81);Muscle weakness (generalized) (M62.81);Other abnormalities of gait and mobility (R26.89)   Activity Tolerance Patient limited by lethargy;Patient tolerated treatment well   Patient Left in chair;with call bell/phone within reach;with chair alarm set   Nurse Communication Mobility status        Time: 3086-5784 OT Time Calculation (min): 13 min  Charges: OT General Charges $OT Visit: 1 Visit OT Treatments $Self Care/Home Management : 8-22 mins  Kathie Dike, M.S. OTR/L  08/20/23, 11:45 AM  ascom [phone]

## 2023-08-21 ENCOUNTER — Inpatient Hospital Stay: Payer: Medicare HMO

## 2023-08-21 DIAGNOSIS — E871 Hypo-osmolality and hyponatremia: Secondary | ICD-10-CM | POA: Diagnosis not present

## 2023-08-21 LAB — BLOOD GAS, VENOUS
Acid-Base Excess: 3.2 mmol/L — ABNORMAL HIGH (ref 0.0–2.0)
Bicarbonate: 28.5 mmol/L — ABNORMAL HIGH (ref 20.0–28.0)
O2 Saturation: 59.1 %
Patient temperature: 37
pCO2, Ven: 45 mm[Hg] (ref 44–60)
pH, Ven: 7.41 (ref 7.25–7.43)
pO2, Ven: 38 mm[Hg] (ref 32–45)

## 2023-08-21 LAB — MRSA NEXT GEN BY PCR, NASAL: MRSA by PCR Next Gen: NOT DETECTED

## 2023-08-21 LAB — GLUCOSE, CAPILLARY: Glucose-Capillary: 114 mg/dL — ABNORMAL HIGH (ref 70–99)

## 2023-08-21 MED ORDER — DIAZEPAM 5 MG/ML IJ SOLN: 10.0000 mg | Freq: Once | INTRAMUSCULAR | Status: DC

## 2023-08-21 MED ORDER — CHLORHEXIDINE GLUCONATE CLOTH 2 % EX PADS
6.0000 | MEDICATED_PAD | Freq: Every day | CUTANEOUS | Status: DC
  Administered 2023-08-21: 6 via TOPICAL

## 2023-08-21 NOTE — Progress Notes (Signed)
Patient without IV access. Two attempts made to gain IV access but were unsuccessful. It is unsafe to administer PO meds as patient aspirates.

## 2023-08-21 NOTE — Progress Notes (Signed)
Mobility Specialist - Progress Note   08/21/23 1200  Mobility  Activity Ambulated with assistance in room;Ambulated with assistance in hallway;Stood at bedside  Level of Assistance Moderate assist, patient does 50-74%  Assistive Device Front wheel walker  Distance Ambulated (ft) 20 ft  $Mobility charge 1 Mobility     Pt lying in bed upon arrival, utilizing RA. Pt agreeable to activity. Pt provided with wet wash cloth for face to increase arousal. Pt completed bed mobility with modA, able to shift hips forward for foot placement onto floor. STS and ambulation with modA + chair follow. Fatigued with activity. Assist for balance. Pt returned to bed with alarm set, needs in reach. Rn notified.    [name] Mobility Specialist 08/21/23, 1:08 PM

## 2023-08-21 NOTE — Progress Notes (Signed)
PROGRESS NOTE    Douglas Peterson.  ZOX:[id number] DOB: Jan 27, 1951 DOA: 08/16/2023 PCP: Pcp, No    Brief Narrative:   72yo with h/o ETOH use d/o, chronic hyponatremia associated with beer potomania, PAF not on AC, TBI, and HTN who presented on 10/13 with ETOH intoxication and a fall.  He had acute on chronic hyponatremia.  He has had 6 ER visits and 8 admissions in the last 6 months.  Recommended for SNF - this would be optimal for him.   [name] declining SNF.  Still exhibiting signs of withdrawal.  Poor ability to swallow.  This was noted previously by SLP.  Currently recommending n.p.o.  Will need to reevaluate swallowing ability and GOC.  Patient has been unwilling or unable to participate in his own discussion.  Daughter not willing to make decisions for him and would prefer not to be contacted unless something disastrous occurs.   Assessment & Plan:   Principal Problem:   Hyponatremia Active Problems:   Alcohol intoxication (HCC)   Elevated LFTs   Chronic diastolic CHF (congestive heart failure) (HCC)   Paroxysmal A-fib (HCC)   Alcoholic cirrhosis of liver without ascites (HCC)   Ground-level fall   Protein-calorie malnutrition, severe  Hyponatremia Acute on chronic hyponatremia in the setting of beer potomania and poor nutrition/hydration 500 cc bolus followed by D5-normal saline at 100 cc/h Plan: Hyponatremia improved.  Monitor off fluids   Alcohol intoxication  Acute alcohol withdrawal Patient has a history of severe alcohol use disorder coming in with a ethanol level of 311 with signs of intoxication on examination High risk for withdrawals. CIWA 10-15 today, still needs monitoring and treatment Plan: Continue oral phenobarbital taper.  Appreciate pharmacy dosing assistance As needed Ativan per CIWA protocol Folic acid, thiamine, MVI   Elevated LFTs In the setting of chronic daily alcohol use Minimally elevated at time of admission, now normalized    Chronic diastolic CHF (congestive heart failure) (HCC) Per chart review, patient has a history of diastolic heart failure with last EF of 60-65% in 2021.   Hypovolemic on presentation in the setting of alcohol use disorder, poor nutrition and dehydration He is at risk for alcohol induced cardiomyopathy.    Paroxysmal A-fib (HCC) Regular rhythm on examination with normal ventricular rate Not on AC due to high risk for falls   Alcoholic cirrhosis of liver without ascites (HCC) Per chart review, patient has a history of alcohol induced cirrhosis however his platelets are normal, and INR was normal when checked 2 months ago and 1 year ago. Seems he was evaluated by [facility] at 1 point and was told he does not have cirrhosis. Outpatient follow-up with GI   Ground-level fall Secondary to acute intoxication and electrolyte derangements.   CT head, C-spine, chest and pelvis with no acute injuries. PT/OT evaluations Recommending SNF.  Patient agreeable to [facility].  Does not meet criteria for assisted living facility at this time   Dyslipidemia Continue atorvastatin   Essential hypertension Continue home atenolol   BPH (benign prostatic hyperplasia) Continue Flomax   GOC  Recurrent hospitalizations and ER visits During his last hospitalization (10/3-8), he refused SNF placement and wanted to dc home with [facility] He is again recommended for SNF placement He is now agreeable to home place.  Does not meet admission criteria Plan to monitor over the next 1 to 2 days and reassess level of mobility for potential safety of discharge home  DVT prophylaxis: SQ Lovenox Code Status: Full Family  Communication: None today Disposition Plan: Status is: Inpatient Remains inpatient appropriate because: Acute alcohol withdrawal   Level of care: Med-Surg  Consultants:  Palliative care  Procedures:  None  Antimicrobials: None   Subjective: Seen and examined.  Encephalopathic.  Weak and  fatigued  Objective: Vitals:   08/20/23 2310 08/21/23 0313 08/21/23 0819 08/21/23 1124  BP: (!) 145/97 125/84 (!) 142/90 (!) 142/91  Pulse: 79 75 86 72  Resp: 20 17 16 16   Temp: 97.9 F (36.6 C) (!) 97.5 F (36.4 C) 98 F (36.7 C) 97.7 F (36.5 C)  TempSrc:    Oral  SpO2: 96% 97% 100% 99%  Weight:      Height:        Intake/Output Summary (Last 24 hours) at 08/21/2023 1327 Last data filed at 08/21/2023 0600 Gross per 24 hour  Intake 440 ml  Output 200 ml  Net 240 ml   Filed Weights   08/16/23 0848 08/18/23 0346  Weight: 62 kg 57.4 kg    Examination:  General exam: Appears weak and fatigued.  Appears frail and chronically ill Respiratory system: Poor respiratory effort.  Normal work of breathing.  Room air Cardiovascular system: S and S2, RRR, no murmurs, no pedal edema Gastrointestinal system: Thin, soft, NT/ND, normal bowel sounds Central nervous system: Alert.  Oriented x 2.  No focal deficits Extremities: Decreased power bilaterally Skin: No rashes, lesions or ulcers Psychiatry: Judgement and insight appear impaired. Mood & affect flattened.     Data Reviewed: I have personally reviewed following labs and imaging studies  CBC: Recent Labs  Lab 08/16/23 0909 08/17/23 0330 08/18/23 0445 08/19/23 0532  WBC 13.9* 7.4 7.3 10.8*  NEUTROABS 5.2 3.6 3.6 7.1  HGB 16.0 14.8 14.7 14.5  HCT 46.2 41.9 41.4 41.4  MCV 90.1 88.4 88.8 90.4  PLT 281 219 191 234   Basic Metabolic Panel: Recent Labs  Lab 08/16/23 0909 08/16/23 1510 08/16/23 2117 08/17/23 0330 08/17/23 0949 08/18/23 0445 08/19/23 0532  NA 122*   < > 131* 129* 129* 130* 133*  K 4.1  --   --  3.7  --  3.5 3.7  CL 81*  --   --  93*  --  96* 99  CO2 24  --   --  27  --  25 24  GLUCOSE 63*  --   --  123*  --  105* 89  BUN <5*  --   --  7*  --  <5* 9  CREATININE 0.54*  --   --  0.57*  --  0.46* 0.60*  CALCIUM 8.8*  --   --  8.5*  --  8.9 9.0   < > = values in this interval not displayed.    GFR: Estimated Creatinine Clearance: 67.8 mL/min (A) (by C-G formula based on SCr of 0.6 mg/dL (L)). Liver Function Tests: Recent Labs  Lab 08/16/23 0909 08/17/23 0330  AST 65* 34  ALT 46* 32  ALKPHOS 94 78  BILITOT 1.4* 1.6*  PROT 9.5* 7.3  ALBUMIN 5.5* 4.2   No results for input(s): "LIPASE", "AMYLASE" in the last 168 hours. No results for input(s): "AMMONIA" in the last 168 hours. Coagulation Profile: No results for input(s): "INR", "PROTIME" in the last 168 hours. Cardiac Enzymes: No results for input(s): "CKTOTAL", "CKMB", "CKMBINDEX", "TROPONINI" in the last 168 hours. BNP (last 3 results) No results for input(s): "PROBNP" in the last 8760 hours. HbA1C: No results for input(s): "HGBA1C" in the last 72 hours.  CBG: Recent Labs  Lab 08/16/23 1743 08/16/23 1838  GLUCAP 58* 96   Lipid Profile: No results for input(s): "CHOL", "HDL", "LDLCALC", "TRIG", "CHOLHDL", "LDLDIRECT" in the last 72 hours. Thyroid Function Tests: No results for input(s): "TSH", "T4TOTAL", "FREET4", "T3FREE", "THYROIDAB" in the last 72 hours. Anemia Panel: No results for input(s): "VITAMINB12", "FOLATE", "FERRITIN", "TIBC", "IRON", "RETICCTPCT" in the last 72 hours. Sepsis Labs: No results for input(s): "PROCALCITON", "LATICACIDVEN" in the last 168 hours.  No results found for this or any previous visit (from the past 240 hour(s)).       Radiology Studies: No results found.      Scheduled Meds:  atenolol  12.5 mg Oral Daily   atorvastatin  10 mg Oral Daily   enoxaparin (LOVENOX) injection  40 mg Subcutaneous Q24H   feeding supplement  237 mL Oral TID BM   lidocaine  1 patch Transdermal Q24H   multivitamin with minerals  1 tablet Oral Daily   pantoprazole  40 mg Oral Daily   phenobarbital  64.8 mg Oral Q8H   Followed by   Melene Muller ON 08/23/2023] phenobarbital  32.4 mg Oral Q8H   sodium chloride flush  3 mL Intravenous Q12H   tamsulosin  0.4 mg Oral QPC breakfast   thiamine  100  mg Oral Daily   Or   thiamine  100 mg Intravenous Daily   Continuous Infusions:   LOS: 5 days     Tresa Moore, MD [facility]   If 7PM-7AM, please contact night-coverage  08/21/2023, 1:27 PM

## 2023-08-21 NOTE — Plan of Care (Signed)
  Problem: Clinical Measurements: Goal: Ability to maintain clinical measurements within normal limits will improve Outcome: Progressing Goal: Will remain free from infection Outcome: Progressing Goal: Respiratory complications will improve Outcome: Progressing   Problem: Safety: Goal: Ability to remain free from injury will improve Outcome: Progressing

## 2023-08-21 NOTE — Progress Notes (Signed)
Patient CIWA score at 16. Scheduled Phenobarbital administered to patient. Patient began to aspirate after sipping water. Charge nurse notifed. Charge nurse arrived in room with three additional nurses. Charge nurse contacted and informed MD of aspiration.      MD at bedside, see new orders.

## 2023-08-21 NOTE — Care Management Important Message (Signed)
Important Message  Patient Details  Name: Douglas Peterson. MRN: [id number] Date of Birth: 1951/05/01   Important Message Given:  N/A - LOS <3 / Initial given by admissions     Olegario Messier A Brianna Bennett 08/21/2023, 9:41 AM
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
## 2023-08-21 NOTE — TOC Progression Note (Signed)
Transition of Care (TOC) - Progression Note    Patient Details  Name: Douglas Peterson. MRN: [id number] Date of Birth: 1951-04-08  Transition of [facility]) CM/SW Contact  Darolyn Rua, [place] Phone Number: 08/21/2023, 10:08 AM  Clinical Narrative:     [facility] completed bedside assessment afternoon of 10/17, they witness PT session which patient was unable to ambulate more than 3-4 feet and needed assistance.   At this time patient will need to demonstrate increased ambulation to go to home place alf as he wishes.   CSW has inquired if patient is able to hire Private Care services at [facility] if he needs additional assistance, no response yet on if this is an option as patient reports having finances for this.   MD aware of above, hopeful of patient's ambulation and mentation improvement in the next 1-2 days.   Expected Discharge Plan: Home w Home Health Services Barriers to Discharge: Continued Medical Work up  Expected Discharge Plan and Services         Expected Discharge Date: 08/21/23                                     Social Determinants of Health (SDOH) Interventions SDOH Screenings   Food Insecurity: Food Insecurity Present (08/19/2023)  Housing: High Risk (08/19/2023)  Transportation Needs: Unmet Transportation Needs (08/19/2023)  Utilities: At Risk (08/19/2023)  Financial Resource Strain: Unknown (06/13/2019)  Physical Activity: Unknown (06/13/2019)  Social Connections: Unknown (06/13/2019)  Stress: Unknown (06/13/2019)  Tobacco Use: Medium Risk (08/16/2023)    Readmission Risk Interventions    06/17/2023    4:23 PM 04/29/2022   10:26 AM 03/14/2022   12:48 PM  Readmission Risk Prevention Plan  Transportation Screening Complete Complete Complete  PCP or Specialist Appt within 3-5 Days   Complete  HRI or Home Care Consult Complete Complete Complete  Social Work Consult for Recovery Care Planning/Counseling Complete Complete Complete   Palliative Care Screening Not Applicable Not Applicable Not Applicable  Medication Review Oceanographer) Complete Complete Complete

## 2023-08-21 NOTE — Plan of Care (Signed)
[facility] Tonsillectomy/Adenoidectomy, Postoperative PEDS care plan entered in error.

## 2023-08-21 NOTE — Progress Notes (Addendum)
SLP Cancellation Note  Patient Details Name: Douglas Peterson. MRN: [id number] DOB: [birth year]   Cancelled treatment:       Reason Eval/Treat Not Completed:  (chart reviewed)   Per chart review, pt admitted w/ hyponatremia and ETOH intoxication and Fall. Still exhibiting signs of withdrawal per MD note. CXR: Low lung volumes with bibasilar atelectasis. Current diet per admitting MD: regular, thins. PMH includes: ETOH use d/o, chronic hyponatremia associated with beer potomania, PAF not on AC, TBI, and HTN. He has had 6 ER visits and 8 admissions in the last 6 months. Recommended for SNF per chart notes.  [name] MBSS 05/2023 revealed: "presents w/ Moderate-Severe, Chronic oropharyngeal Dysphagia w/ contributing factors of motor speech and potential cognitive-communication deficits (in setting of prior L CVA w/ R sided weakness, Ongoing ETOH use/abuse) and deconditioning/weakness. Pt is at HIGH risk for aspiration, aspiration pneumonia; negative sequelae from aspiration. Unsure of pt's full level of comprehension of medical status, Dysphagia, and risk for aspiration, aspiration pneumonia.  Laryngeal penetration occurred intermittently w/ thin and Nectar liquids (trace coating along the underside of the epiglottis) AND also occurring was laryngeal Penetration with Aspiration from bolus RESIDUE from the Valleculae and Pyrirform Sinuses BETWEEN trials. Initial Aspiration occurring during this study was Silent w/ a Delayed, Weak cough; pt was insensate to the viewed(appearing) build-up of trace aspirated material in the cervical trachea (viewable).  Reviewed study findings w/ pt using images.  Recommend NPO status w/ frequent oral care to reduce oral bacterial load and to provide stimulation of swallowing and oral movements. MD to discuss further w/ pt including pt's GOC moving forward.".   The Conclusion by the MD and pt at Discharge (05/2023): "Per MD notes, pt is choosing to continue an oral diet at this  time and agreed w/ DNR code status."     In setting of the severity of pt's oropharyngeal phase dysphagia, ST services continues to recommend the same -- alternative means of feeding (PEG). Noted Palliative Care is following currently for overall GOC and discussion of stopping the Alcohol. Will consult w/ MD/Palliative Care re: need for further f/u per pt's personal GOC.       Jerilynn Som, MS, CCC-SLP Speech Language Pathologist Rehab Services; [facility] [phone] (ascom) Nikiah Goin 08/21/2023, 2:21 PM

## 2023-08-22 DIAGNOSIS — E871 Hypo-osmolality and hyponatremia: Secondary | ICD-10-CM | POA: Diagnosis not present

## 2023-08-22 LAB — CBC
HCT: 43.4 % (ref 39.0–52.0)
Hemoglobin: 14.9 g/dL (ref 13.0–17.0)
MCH: 31.4 pg (ref 26.0–34.0)
MCHC: 34.3 g/dL (ref 30.0–36.0)
MCV: 91.6 fL (ref 80.0–100.0)
Platelets: 209 10*3/uL (ref 150–400)
RBC: 4.74 MIL/uL (ref 4.22–5.81)
RDW: 12.3 % (ref 11.5–15.5)
WBC: 6.3 10*3/uL (ref 4.0–10.5)
nRBC: 0 % (ref 0.0–0.2)

## 2023-08-22 LAB — COMPREHENSIVE METABOLIC PANEL
ALT: 18 U/L (ref 0–44)
AST: 21 U/L (ref 15–41)
Albumin: 4.2 g/dL (ref 3.5–5.0)
Alkaline Phosphatase: 68 U/L (ref 38–126)
Anion gap: 10 (ref 5–15)
BUN: 16 mg/dL (ref 8–23)
CO2: 25 mmol/L (ref 22–32)
Calcium: 9.3 mg/dL (ref 8.9–10.3)
Chloride: 104 mmol/L (ref 98–111)
Creatinine, Ser: 0.47 mg/dL — ABNORMAL LOW (ref 0.61–1.24)
GFR, Estimated: >60 mL/min (ref >60)
Glucose, Bld: 97 mg/dL (ref 70–99)
Potassium: 3.7 mmol/L (ref 3.5–5.1)
Sodium: 139 mmol/L (ref 135–145)
Total Bilirubin: 0.6 mg/dL (ref 0.3–1.2)
Total Protein: 7.9 g/dL (ref 6.5–8.1)

## 2023-08-22 LAB — GLUCOSE, CAPILLARY
Glucose-Capillary: 93 mg/dL (ref 70–99)
Glucose-Capillary: 100 mg/dL — ABNORMAL HIGH (ref 70–99)
Glucose-Capillary: 112 mg/dL — ABNORMAL HIGH (ref 70–99)

## 2023-08-22 LAB — TSH: TSH: 0.733 u[IU]/mL (ref 0.350–4.500)

## 2023-08-22 LAB — AMMONIA: Ammonia: 12 umol/L (ref 9–35)

## 2023-08-22 MED ORDER — LORAZEPAM 1 MG PO TABS
1.0000 mg | ORAL_TABLET | ORAL | Status: AC | PRN
  Administered 2023-08-25 (×2): 1 mg via ORAL
  Filled 2023-08-22 (×2): qty 1

## 2023-08-22 MED ORDER — LORAZEPAM 2 MG/ML IJ SOLN
1.0000 mg | INTRAMUSCULAR | Status: AC | PRN
  Administered 2023-08-23 – 2023-08-24 (×3): 2 mg via INTRAVENOUS
  Filled 2023-08-22 (×4): qty 1

## 2023-08-22 MED ORDER — FLEET ENEMA RE ENEM: 1.0000 | ENEMA | Freq: Every day | RECTAL | Status: DC | PRN

## 2023-08-22 MED ORDER — ORAL CARE MOUTH RINSE
15.0000 mL | OROMUCOSAL | Status: DC
  Administered 2023-08-22 – 2023-08-26 (×16): 15 mL via OROMUCOSAL

## 2023-08-22 MED ORDER — DEXTROSE-SODIUM CHLORIDE 5-0.45 % IV SOLN: INTRAVENOUS | Status: AC

## 2023-08-22 MED ORDER — BISACODYL 10 MG RE SUPP
10.0000 mg | Freq: Every day | RECTAL | Status: DC | PRN
  Administered 2023-08-22: 10 mg via RECTAL
  Filled 2023-08-22: qty 1

## 2023-08-22 NOTE — Consult Note (Addendum)
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   ED Course: On arrival to the ED, patient was hypertensive at 152/83 with heart rate of 99.  He was saturating at 96% on room air.  He was afebrile at 98.2.  Initial CBG of 63. Additional workup includes a WBC of 13.9, sodium of 122, glucose of 63, creatinine 0.54 with GFR above 60.  AST 65, ALT 46.  CT of the head, C-spine chest x-ray and pelvic x-ray were obtained with no acute findings.  Patient was started on thiamine and folic acid with IV fluids.    Patient admitted to [facility] service. 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  Remains on CIWA monitoring 10/16: Goals of care discussed for possible SNF placement.  Still exhibiting signs of withdrawal with poor ability to swallow. 10/17: Remains on CIWA protocol due to still exhibiting signs of withdrawal continues phenobarb taper 10/18: Transferred to the ICU for agitation and possible aspiration event.  PCCM consulted 10/19: No significant events overnight, remain confused. Hemodynamically stable and protecting his airway  Consults:  PCCM  Procedures:  None  Significant Diagnostic Tests:  10/18: Chest  Xray> IMPRESSION: 1. Low lung volume. Bibasilar atelectasis 10/13: Chest Xray> IMPRESSION: Mild bibasilar atelectasis. 10/13: Noncontrast CT head/Cervical Spine> IMPRESSION: 1. No acute intracranial process. 2. Chronic small-vessel ischemia periventricular white matter and age-related involutional changes. 3. Cervical degenerative changes. 4. No acute traumatic abnormalities.  Interim History / Subjective:      Micro Data:  None  Antimicrobials:  None  OBJECTIVE  Blood pressure (!) 151/83, pulse 74, temperature (!) 96.4 F (35.8 C), temperature source Axillary, resp. rate (!) 22, height 5\' 6"  (1.676 m), weight 54.2 kg, SpO2 98%.      Intake/Output Summary (Last 24 hours) at 08/22/2023 0527 Last data filed at 08/22/2023 0300 Gross per 24 hour  Intake 3 ml  Output 100 ml  Net -97 ml   Filed Weights   08/16/23 0848 08/18/23 0346 08/21/23 1915  Weight: 62 kg 57.4 kg 54.2 kg     Physical Examination  GENERAL: 72 year-old critically ill patient lying in the bed in no acute distress EYES: PEERLA. No scleral icterus. Extraocular muscles intact.  HEENT: Head atraumatic, normocephalic. Oropharynx and nasopharynx clear.  NECK:  No JVD supple  LUNGS: Normal breath sounds bilaterally.  No use of accessory muscles of respiration.  CARDIOVASCULAR: S1, S2 normal. No murmurs, rubs, or gallops.  ABDOMEN: Soft, NTND EXTREMITIES: No swelling or erythema.  Contracted RUE. pulses palpable distally. NEUROLOGIC: The patient is oriented to self. No focal neurological deficit appreciated. Cranial nerves are intact.  SKIN: No obvious rash, lesion, or ulcer. Warm to touch Labs/imaging that I havepersonally reviewed  (right click and "Reselect all SmartList  Selections" daily)     Labs   CBC: Recent Labs  Lab 08/16/23 0909 08/17/23 0330 08/18/23 0445 08/19/23 0532  WBC 13.9* 7.4 7.3 10.8*  NEUTROABS 5.2 3.6 3.6 7.1  HGB 16.0 14.8 14.7 14.5  HCT 46.2 41.9 41.4 41.4  MCV 90.1  88.4 88.8 90.4  PLT 281 219 191 234    Basic Metabolic Panel: Recent Labs  Lab 08/16/23 0909 08/16/23 1510 08/16/23 2117 08/17/23 0330 08/17/23 0949 08/18/23 0445 08/19/23 0532  NA 122*   < > 131* 129* 129* 130* 133*  K 4.1  --   --  3.7  --  3.5 3.7  CL 81*  --   --  93*  --  96* 99  CO2 24  --   --  27  --  25 24  GLUCOSE 63*  --   --  123*  --  105* 89  BUN <5*  --   --  7*  --  <5* 9  CREATININE 0.54*  --   --  0.57*  --  0.46* 0.60*  CALCIUM 8.8*  --   --  8.5*  --  8.9 9.0   < > = values in this interval not displayed.   GFR: Estimated Creatinine Clearance: 64 mL/min (A) (by C-G formula based on SCr of 0.6 mg/dL (L)). Recent Labs  Lab 08/16/23 0909 08/17/23 0330 08/18/23 0445 08/19/23 0532  WBC 13.9* 7.4 7.3 10.8*    Liver Function Tests: Recent Labs  Lab 08/16/23 0909 08/17/23 0330  AST 65* 34  ALT 46* 32  ALKPHOS 94 78  BILITOT 1.4* 1.6*  PROT 9.5* 7.3  ALBUMIN 5.5* 4.2   No results for input(s): "LIPASE", "AMYLASE" in the last 168 hours. No results for input(s): "AMMONIA" in the last 168 hours.  ABG    Component Value Date/Time   HCO3 28.5 (H) 08/21/2023 1852   O2SAT 59.1 08/21/2023 1852     Coagulation Profile: No results for input(s): "INR", "PROTIME" in the last 168 hours.  Cardiac Enzymes: No results for input(s): "CKTOTAL", "CKMB", "CKMBINDEX", "TROPONINI" in the last 168 hours.  HbA1C: No results found for: "HGBA1C"  CBG: Recent Labs  Lab 08/16/23 1743 08/16/23 1838 08/21/23 1909  GLUCAP 58* 96 114*    Review of Systems:   Unable to be obtained secondary to the patient's altered mental status.   Past Medical History  He,  has a past medical history of Alcohol abuse, Atrial fibrillation (HCC), Hypertension, Hyponatremia, Pressure ulcer of buttock, TBI (traumatic brain injury) (HCC), and Weakness of right arm.   Surgical History    Past Surgical History:  Procedure Laterality Date   APPENDECTOMY     KYPHOPLASTY N/A  11/18/2018   Procedure: KYPHOPLASTY L2;  Surgeon: Kennedy Bucker, MD;  Location: [facility] ORS;  Service: Orthopedics;  Laterality: N/A;   NECK SURGERY       Social History   reports that he has quit smoking. He has never used smokeless tobacco. He reports current alcohol use of about 126.0 standard drinks of alcohol per week. He reports that he does not use drugs.   Family History   His family history includes Heart attack in his father; Lung cancer in his mother.   Allergies Allergies  Allergen Reactions   Hydrochlorothiazide Other (See Comments)    Hyponatremia     Home Medications  Prior to Admission medications   Medication Sig Start Date End Date Taking? Authorizing Provider  acetaminophen (TYLENOL) 500 MG tablet Take  2 tablets (1,000 mg total) by mouth every 8 (eight) hours as needed for mild pain or moderate pain. 04/26/22  Yes Darlin Priestly, MD  atenolol (TENORMIN) 25 MG tablet Take 0.5 tablets (12.5 mg total) by mouth daily. Patient taking differently: Take 25 mg by mouth daily. 08/11/23 09/10/23 Yes Sunnie Nielsen, DO  atorvastatin (LIPITOR) 10 MG tablet Take 1 tablet (10 mg total) by mouth daily. 07/21/23 08/20/23 Yes Leeroy Bock, MD  baclofen 5 MG TABS Take 1 tablet (5 mg total) by mouth 3 (three) times daily. 08/11/23  Yes Sunnie Nielsen, DO  folic acid (FOLVITE) 1 MG tablet Take 1 tablet (1 mg total) by mouth daily. 09/20/21  Yes Bradler, Clent Jacks, MD  melatonin 5 MG TABS Take 1 tablet (5 mg total) by mouth at bedtime. 06/26/23  Yes Pennie Banter, DO  Multiple Vitamin (MULTIVITAMIN WITH MINERALS) TABS tablet Take 1 tablet by mouth daily. 04/26/22  Yes Darlin Priestly, MD  pantoprazole (PROTONIX) 40 MG tablet Take 1 tablet (40 mg total) by mouth daily. 07/15/23  Yes Jonah Blue, MD  polyethylene glycol (MIRALAX / GLYCOLAX) 17 g packet Take 17 g by mouth daily as needed. Mix one tablespoon with 8oz of your favorite juice or water every day until you are having soft formed  stools. Then start taking once daily if you didn't have a stool the day before. 06/26/23  Yes Esaw Grandchild A, DO  thiamine 100 MG tablet Take 1 tablet (100 mg total) by mouth daily. 09/20/21  Yes Merwyn Katos, MD  feeding supplement (ENSURE ENLIVE / ENSURE PLUS) LIQD Take 237 mLs by mouth 2 (two) times daily between meals. 09/20/21   Merwyn Katos, MD  sucralfate (CARAFATE) 1 g tablet Take 1 tablet (1 g total) by mouth 4 (four) times daily -  with meals and at bedtime. Patient not taking: Reported on 07/30/2023 07/15/23   Jonah Blue, MD  Scheduled Meds:  atenolol  12.5 mg Oral Daily   atorvastatin  10 mg Oral Daily   Chlorhexidine Gluconate Cloth  6 each Topical Daily   diazepam  10 mg Intramuscular Once   enoxaparin (LOVENOX) injection  40 mg Subcutaneous Q24H   feeding supplement  237 mL Oral TID BM   lidocaine  1 patch Transdermal Q24H   multivitamin with minerals  1 tablet Oral Daily   mouth rinse  15 mL Mouth Rinse 4 times per day   pantoprazole  40 mg Oral Daily   phenobarbital  64.8 mg Oral Q8H   Followed by   Melene Muller ON 08/23/2023] phenobarbital  32.4 mg Oral Q8H   sodium chloride flush  3 mL Intravenous Q12H   tamsulosin  0.4 mg Oral QPC breakfast   thiamine  100 mg Oral Daily   Or   thiamine  100 mg Intravenous Daily   Continuous Infusions: PRN Meds:.acetaminophen **OR** acetaminophen, LORazepam **OR** LORazepam, melatonin, ondansetron **OR** ondansetron (ZOFRAN) IV, mouth rinse, oxyCODONE, polyethylene glycol   Active Hospital Problem list   See systems below  Assessment & Plan:  #EtOH Withdrawal  Hx of EtOH Abuse, Alcoholic Cirrhosis without Ascites, recurrent Falls EtOH levels of admission -Daily BMP+Mg -Daily Thiamine, Folate, MVI once tolerating PO -Continue Phenobarb Taper, Lorazepam PRN per CIWA protocol -Not requiring precedex for agitation -SW consult for cessation resources -Falls precaution -PT/OT evaluation for mobility  #Acute on Chronic  Hyponatremia Pt is euvolemic on exam, w/ hx of EtOH Abuse 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  -  Check TSH  -Na at goal, daily BMP  #PAF #HTN #HLD -Rate controlled, currently not anticoagulated due to recurrent falls, hx of TBI -Continue Atenolol -Continue Atorvastatin  #Dysphagia High risk for Aspiration -keep NPO -SLP following -IVFs      Best practice:  Diet:  NPO Pain/Anxiety/Delirium protocol (if indicated): No VAP protocol (if indicated): Not indicated DVT prophylaxis: LMWH GI prophylaxis: PPI Glucose control:  SSI Yes Central venous access:  N/A Arterial line:  N/A Foley:  Yes, and it is still needed Mobility:  bed rest  PT consulted: Yes Last date of multidisciplinary goals of care discussion [uta] Code Status:  full code Disposition: Stepdown   = Goals of Care = Code Status Order: FULL  Primary Emergency Contact: Mallon,Wendy, Home Phone: [phone] Wishes to pursue full aggressive treatment and intervention options, including CPR and intubation, but goals of care will be addressed on going with family if that should become necessary.  Critical care time: 45 minutes        Webb Silversmith DNP, CCRN, FNP-C, AGACNP-BC Acute Care & Family Nurse Practitioner [facility] PCCM on call pager [phone]

## 2023-08-22 NOTE — Progress Notes (Signed)
Pt A&O to self throughout shift. VS taken per unit protocol-- slight elevation in BP at times, MD made aware. Pt still unsafe to take PO meds at this time d/t high risk for aspiration. No S/S of discomfort tor pain throughout shift. Minimal withdrawal symptoms, patient remained drowsy but arousable.  No need for PRN Ativan.

## 2023-08-22 NOTE — Progress Notes (Signed)
PROGRESS NOTE    Douglas Peterson.  EXB:[id number] DOB: Mar 28, 1951 DOA: 08/16/2023 PCP: Pcp, No    Brief Narrative:   72yo with h/o ETOH use d/o, chronic hyponatremia associated with beer potomania, PAF not on AC, TBI, and HTN who presented on 10/13 with ETOH intoxication and a fall.  He had acute on chronic hyponatremia.  He has had 6 ER visits and 8 admissions in the last 6 months.  Recommended for SNF - this would be optimal for him.   [name] declining SNF.  Still exhibiting signs of withdrawal.  Poor ability to swallow.  This was noted previously by SLP.  Currently recommending n.p.o.  Will need to reevaluate swallowing ability and GOC.  Patient has been unwilling or unable to participate in his own discussion.  Daughter not willing to make decisions for him and would prefer not to be contacted unless something disastrous occurs.  10/19: Transferred to ICU yesterday for increasing agitation and possible aspiration event.  Remained stable overnight.  No evidence of acute aspiration.   Assessment & Plan:   Principal Problem:   Hyponatremia Active Problems:   Alcohol intoxication (HCC)   Elevated LFTs   Chronic diastolic CHF (congestive heart failure) (HCC)   Paroxysmal A-fib (HCC)   Alcoholic cirrhosis of liver without ascites (HCC)   Ground-level fall   Protein-calorie malnutrition, severe  Hyponatremia Acute on chronic hyponatremia in the setting of beer potomania and poor nutrition/hydration 500 cc bolus followed by D5-normal saline at 100 cc/h Plan: Hyponatremia improved   Alcohol intoxication  Acute alcohol withdrawal Patient has a history of severe alcohol use disorder coming in with a ethanol level of 311 with signs of intoxication on examination High risk for withdrawals. Difficult to interpret given resting tremor at baseline and poor baseline mentation Plan: Continue oral phenobarbital taper.  Appreciate pharmacy dosing assistance As needed Ativan per CIWA  protocol Folic acid, thiamine, MVI   Elevated LFTs In the setting of chronic daily alcohol use Minimally elevated at time of admission, now normalized   Chronic diastolic CHF (congestive heart failure) (HCC) Per chart review, patient has a history of diastolic heart failure with last EF of 60-65% in 2021.   Hypovolemic on presentation in the setting of alcohol use disorder, poor nutrition and dehydration He is at risk for alcohol induced cardiomyopathy.    Paroxysmal A-fib (HCC) Regular rhythm on examination with normal ventricular rate Not on AC due to high risk for falls   Alcoholic cirrhosis of liver without ascites (HCC) Per chart review, patient has a history of alcohol induced cirrhosis however his platelets are normal, and INR was normal when checked 2 months ago and 1 year ago. Resume outpatient GI follow-up   Ground-level fall Secondary to acute intoxication and electrolyte derangements.   CT head, C-spine, chest and pelvis with no acute injuries. PT/OT evaluations Recommending SNF.  Patient agreeable to home place.  Does not meet criteria for assisted living facility at this time   Dyslipidemia Continue atorvastatin   Essential hypertension Continue home atenolol   BPH (benign prostatic hyperplasia) Continue Flomax   GOC  Recurrent hospitalizations and ER visits During his last hospitalization (10/3-8), he refused SNF placement and wanted to dc home with [facility] He is again recommended for SNF placement He is now agreeable to home place.  Does not meet admission criteria Plan to monitor over the next 1 to 2 days and reassess level of mobility for potential safety of discharge home  DVT prophylaxis:  SQ Lovenox Code Status: Full Family Communication: None today Disposition Plan: Status is: Inpatient Remains inpatient appropriate because: Acute alcohol withdrawal   Level of care: Med-Surg  Consultants:  Palliative care  Procedures:   None  Antimicrobials: None   Subjective: Seen and examined.  Remains encephalopathic.  Remains weak and fatigued  Objective: Vitals:   08/22/23 0700 08/22/23 0800 08/22/23 1200 08/22/23 1210  BP: 138/78 (!) 150/84 (!) 166/86   Pulse: 66 67    Resp: 19 (!) 21    Temp:  98.7 F (37.1 C)  98.1 F (36.7 C)  TempSrc:  Axillary  Axillary  SpO2: 99% 100%    Weight:      Height:        Intake/Output Summary (Last 24 hours) at 08/22/2023 1430 Last data filed at 08/22/2023 1200 Gross per 24 hour  Intake 136.94 ml  Output 150 ml  Net -13.06 ml   Filed Weights   08/16/23 0848 08/18/23 0346 08/21/23 1915  Weight: 62 kg 57.4 kg 54.2 kg    Examination:  General exam: Weak and fatigued.  Frail and chronically ill Respiratory system: Poor respiratory effort.  Normal work of breathing.  Room air Cardiovascular system: S and S2, RRR, no murmurs, no pedal edema Gastrointestinal system: Thin, soft, NT/ND, normal bowel sounds Central nervous system: Alert.  Unable to assess orientation.  No focal deficits Extremities: Decreased power bilaterally Skin: No rashes, lesions or ulcers Psychiatry: Judgement and insight appear impaired. Mood & affect flattened.     Data Reviewed: I have personally reviewed following labs and imaging studies  CBC: Recent Labs  Lab 08/16/23 0909 08/17/23 0330 08/18/23 0445 08/19/23 0532 08/22/23 0556  WBC 13.9* 7.4 7.3 10.8* 6.3  NEUTROABS 5.2 3.6 3.6 7.1  --   HGB 16.0 14.8 14.7 14.5 14.9  HCT 46.2 41.9 41.4 41.4 43.4  MCV 90.1 88.4 88.8 90.4 91.6  PLT 281 219 191 234 209   Basic Metabolic Panel: Recent Labs  Lab 08/16/23 0909 08/16/23 1510 08/17/23 0330 08/17/23 0949 08/18/23 0445 08/19/23 0532 08/22/23 0556  NA 122*   < > 129* 129* 130* 133* 139  K 4.1  --  3.7  --  3.5 3.7 3.7  CL 81*  --  93*  --  96* 99 104  CO2 24  --  27  --  25 24 25   GLUCOSE 63*  --  123*  --  105* 89 97  BUN <5*  --  7*  --  <5* 9 16  CREATININE 0.54*   --  0.57*  --  0.46* 0.60* 0.47*  CALCIUM 8.8*  --  8.5*  --  8.9 9.0 9.3   < > = values in this interval not displayed.   GFR: Estimated Creatinine Clearance: 64 mL/min (A) (by C-G formula based on SCr of 0.47 mg/dL (L)). Liver Function Tests: Recent Labs  Lab 08/16/23 0909 08/17/23 0330 08/22/23 0556  AST 65* 34 21  ALT 46* 32 18  ALKPHOS 94 78 68  BILITOT 1.4* 1.6* 0.6  PROT 9.5* 7.3 7.9  ALBUMIN 5.5* 4.2 4.2   No results for input(s): "LIPASE", "AMYLASE" in the last 168 hours. Recent Labs  Lab 08/22/23 0556  AMMONIA 12   Coagulation Profile: No results for input(s): "INR", "PROTIME" in the last 168 hours. Cardiac Enzymes: No results for input(s): "CKTOTAL", "CKMB", "CKMBINDEX", "TROPONINI" in the last 168 hours. BNP (last 3 results) No results for input(s): "PROBNP" in the last 8760 hours. HbA1C: No  results for input(s): "HGBA1C" in the last 72 hours. CBG: Recent Labs  Lab 08/16/23 1743 08/16/23 1838 08/21/23 1909 08/22/23 1211  GLUCAP 58* 96 114* 93   Lipid Profile: No results for input(s): "CHOL", "HDL", "LDLCALC", "TRIG", "CHOLHDL", "LDLDIRECT" in the last 72 hours. Thyroid Function Tests: Recent Labs    08/22/23 0556  TSH 0.733   Anemia Panel: No results for input(s): "VITAMINB12", "FOLATE", "FERRITIN", "TIBC", "IRON", "RETICCTPCT" in the last 72 hours. Sepsis Labs: No results for input(s): "PROCALCITON", "LATICACIDVEN" in the last 168 hours.  Recent Results (from the past 240 hour(s))  MRSA Next Gen by PCR, Nasal     Status: None   Collection Time: 08/21/23  7:17 PM   Specimen: Nasal Mucosa; Nasal Swab  Result Value Ref Range Status   MRSA by PCR Next Gen NOT DETECTED NOT DETECTED Final    Comment: (NOTE) The GeneXpert MRSA Assay (FDA approved for NASAL specimens only), is one component of a comprehensive MRSA colonization surveillance program. It is not intended to diagnose MRSA infection nor to guide or monitor treatment for MRSA  infections. Test performance is not FDA approved in patients less than 49 years old. Performed at [facility], [address], [place], [place] [address]          Radiology Studies: [facility] Chest Bude 1 View  Result Date: 08/21/2023 CLINICAL DATA:  Cough.  Aspiration. EXAM: PORTABLE CHEST 1 VIEW COMPARISON:  08/19/2023. FINDINGS: Low lung volume. There are probable atelectatic changes at the lung bases. Bilateral lung fields are otherwise clear. No acute consolidation or lung collapse. Bilateral costophrenic angles are clear. Stable cardio-mediastinal silhouette. No acute osseous abnormalities. Bilateral old healed rib fractures and bilateral clavicular fractures noted. The soft tissues are within normal limits. IMPRESSION: 1. Low lung volume. Bibasilar atelectasis. Electronically Signed   By: Jules Schick M.D.   On: 08/21/2023 18:59        Scheduled Meds:  atenolol  12.5 mg Oral Daily   atorvastatin  10 mg Oral Daily   Chlorhexidine Gluconate Cloth  6 each Topical Daily   diazepam  10 mg Intramuscular Once   enoxaparin (LOVENOX) injection  40 mg Subcutaneous Q24H   feeding supplement  237 mL Oral TID BM   lidocaine  1 patch Transdermal Q24H   multivitamin with minerals  1 tablet Oral Daily   mouth rinse  15 mL Mouth Rinse 4 times per day   pantoprazole  40 mg Oral Daily   phenobarbital  64.8 mg Oral Q8H   Followed by   Melene Muller ON 08/23/2023] phenobarbital  32.4 mg Oral Q8H   sodium chloride flush  3 mL Intravenous Q12H   tamsulosin  0.4 mg Oral QPC breakfast   thiamine  100 mg Oral Daily   Or   thiamine  100 mg Intravenous Daily   Continuous Infusions:  dextrose 5 % and 0.45 % NaCl 40 mL/hr at 08/22/23 1021     LOS: 6 days     Tresa Moore, MD [facility]   If 7PM-7AM, please contact night-coverage  08/22/2023, 2:30 PM

## 2023-08-22 NOTE — Progress Notes (Addendum)
SLP Cancellation Note  Patient Details Name: Douglas Peterson. MRN: [id number] DOB: [birth year]   Cancelled treatment:         Per chart notes, pt had a significant event requiring transfer to CCU:  "10/18: Transferred to the ICU for agitation and possible aspiration event.  PCCM consulted 10/19: No significant events overnight, remain confused. Hemodynamically stable and protecting his airway".   Per discussion w/ MD, in setting of the severity of pt's BASELINE oropharyngeal phase Dysphagia and HIGH risk for aspiration, ST services continues to recommend NPO status w/ alternative means of feeding (PEG). Palliative Care is following currently for overall GOC and discussion of stopping the Alcohol; pt's GOC. No Acute ST services are indicated at this time. MD agreed. Recommend frequent oral care for hygiene and stimulation of swallowing.       Jerilynn Som, MS, CCC-SLP Speech Language Pathologist Rehab Services; [facility] [phone] (ascom) Aariya Ferrick 08/22/2023, 11:04 AM

## 2023-08-22 NOTE — Plan of Care (Signed)

## 2023-08-22 NOTE — Plan of Care (Signed)
No ICU NEEDS  BP (!) 141/73   Pulse 76   Temp (!) 96.4 F (35.8 C) (Axillary)   Resp 13   Ht 5\' 6"  (1.676 m)   Wt 54.2 kg   SpO2 99%   BMI 19.29 kg/m   PCCM to sign off

## 2023-08-22 NOTE — Plan of Care (Signed)
  Problem: Clinical Measurements: Goal: Diagnostic test results will improve Outcome: Progressing Goal: Respiratory complications will improve Outcome: Progressing Goal: Cardiovascular complication will be avoided Outcome: Progressing   Problem: Activity: Goal: Risk for activity intolerance will decrease Outcome: Not Progressing   Problem: Nutrition: Goal: Adequate nutrition will be maintained Outcome: Not Progressing

## 2023-08-23 DIAGNOSIS — E871 Hypo-osmolality and hyponatremia: Secondary | ICD-10-CM | POA: Diagnosis not present

## 2023-08-23 LAB — GLUCOSE, CAPILLARY
Glucose-Capillary: 112 mg/dL — ABNORMAL HIGH (ref 70–99)
Glucose-Capillary: 90 mg/dL (ref 70–99)

## 2023-08-23 MED ORDER — PHENOBARBITAL SODIUM 65 MG/ML IJ SOLN
65.0000 mg | Freq: Three times a day (TID) | INTRAMUSCULAR | Status: AC
  Administered 2023-08-23 (×2): 65 mg via INTRAVENOUS
  Filled 2023-08-23 (×3): qty 1

## 2023-08-23 MED ORDER — PHENOBARBITAL SODIUM 65 MG/ML IJ SOLN
32.4000 mg | Freq: Three times a day (TID) | INTRAMUSCULAR | Status: AC
  Administered 2023-08-24 – 2023-08-25 (×6): 32.4 mg via INTRAVENOUS
  Filled 2023-08-23 (×6): qty 1

## 2023-08-23 NOTE — Progress Notes (Signed)
Occupational Therapy Re-evaluation Patient Details Name: Douglas Peterson. MRN: [id number] DOB: [birth year] Today's Date: 08/23/2023   History of present illness 72 y.o. 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 Mult recent hospitalizations due to alcohol related issues. 10/18 was transferred to ICU with increasing agitation and possible aspiration event.  Remained stable overnight.  No evidence of acute aspiration;   OT comments  Chart reviewed, noted new imminent discharge ordes, re-eval completed. Pt is dysphonic, oriented to self and place, not oriented to date or situation. Pt continues to present with deficits in strength, endurance, activity tolerance, balance. MOD A required for supine>sit, MAX A for sit>supine with poor sitting balance and posterior lean. MAX A via HHA  to simulate cane for STS with pt with poor standing balance. MAX A for UB/LB dressing. Pt will continue to benefit from skilled OT to address deficits and to facilitate optimal ADL completion.       If plan is discharge home, recommend the following:  A lot of help with walking and/or transfers;A lot of help with bathing/dressing/bathroom;Supervision due to cognitive status;Assistance with cooking/housework;Assist for transportation;Help with stairs or ramp for entrance   Equipment Recommendations  Other (comment) (per next venue of care)    Recommendations for Other Services      Precautions / Restrictions Precautions Precautions: Fall Restrictions Weight Bearing Restrictions: No       Mobility Bed Mobility Overal bed mobility: Needs Assistance Bed Mobility: Supine to Sit, Sit to Supine Rolling: Min assist   Supine to sit: Mod assist Sit to supine: Max assist        Transfers Overall transfer level: Needs  assistance Equipment used: 1 person hand held assist Transfers: Sit to/from Stand Sit to Stand: Mod assist                 Balance Overall balance assessment: Needs assistance Sitting-balance support: Feet supported, Bilateral upper extremity supported Sitting balance-Leahy Scale: Poor   Postural control: Posterior lean Standing balance support: Single extremity supported, During functional activity Standing balance-Leahy Scale: Poor                             ADL either performed or assessed with clinical judgement   ADL Overall ADL's : Needs assistance/impaired     Grooming: Wash/dry face;Wash/dry hands;Sitting;Moderate assistance       Lower Body Bathing: Maximal assistance;Sit to/from stand   Upper Body Dressing : Maximal assistance   Lower Body Dressing: Maximal assistance;Sitting/lateral leans   Toilet Transfer: Moderate assistance;Maximal assistance Toilet Transfer Details (indicate cue type and reason): simulated Toileting- Clothing Manipulation and Hygiene: Maximal assistance;Sit to/from stand              Extremity/Trunk Assessment Upper Extremity Assessment Upper Extremity Assessment: Generalized weakness;RUE deficits/detail RUE Deficits / Details: flexor synergy, chronic RUE Coordination: decreased fine motor;decreased gross motor   Lower Extremity Assessment Lower Extremity Assessment: RLE deficits/detail;Generalized weakness   Cervical / Trunk Assessment Cervical / Trunk Assessment: Kyphotic    Vision Patient Visual Report: No change from baseline     Perception     Praxis      Cognition Arousal: Lethargic Behavior During Therapy: Flat affect Overall Cognitive Status: No family/caregiver present to determine baseline cognitive functioning Area of  Impairment: Orientation, Following commands, Safety/judgement, Problem solving                 Orientation Level: Disoriented to, Time, Situation     Following  Commands: Follows one step commands with increased time Safety/Judgement: Decreased awareness of deficits   Problem Solving: Slow processing, Requires verbal cues, Requires tactile cues, Decreased initiation          Exercises      Shoulder Instructions       General Comments      Pertinent Vitals/ Pain       Pain Assessment Pain Assessment: PAINAD Breathing: normal Negative Vocalization: occasional moan/groan, low speech, negative/disapproving quality Facial Expression: smiling or inexpressive Body Language: tense, distressed pacing, fidgeting Consolability: distracted or reassured by voice/touch PAINAD Score: 3 Pain Location: back Pain Descriptors / Indicators: Discomfort, Grimacing, Sharp, Constant Pain Intervention(s): Monitored during session, Limited activity within patient's tolerance, Repositioned  Home Living                                          Prior Functioning/Environment              Frequency  Min 1X/week        Progress Toward Goals  OT Goals(current goals can now be found in the care plan section)  Progress towards OT goals: Not progressing toward goals - comment (will continue to assess)  Acute Rehab OT Goals Patient Stated Goal: go home OT Goal Formulation: With patient Time For Goal Achievement: 09/06/23 Potential to Achieve Goals: Fair  Plan      Co-evaluation                 AM-PAC OT "6 Clicks" Daily Activity     Outcome Measure   Help from another person eating meals?: A Lot Help from another person taking care of personal grooming?: A Lot Help from another person toileting, which includes using toliet, bedpan, or urinal?: A Lot Help from another person bathing (including washing, rinsing, drying)?: A Lot Help from another person to put on and taking off regular upper body clothing?: A Lot Help from another person to put on and taking off regular lower body clothing?: A Lot 6 Click Score: 12     End of Session    OT Visit Diagnosis: Unsteadiness on feet (R26.81);Muscle weakness (generalized) (M62.81);Other abnormalities of gait and mobility (R26.89)   Activity Tolerance Patient limited by lethargy   Patient Left in bed;with call bell/phone within reach;with bed alarm set   Nurse Communication Mobility status        Time: 4098-1191 OT Time Calculation (min): 19 min  Charges: OT General Charges $OT Visit: 1 Visit OT Evaluation $OT Re-eval: 1 Re-eval OT Treatments $Therapeutic Activity: 8-22 mins  Oleta Mouse, OTD OTR/L  08/23/23, 3:52 PM

## 2023-08-23 NOTE — Evaluation (Signed)
Physical Therapy Evaluation Patient Details Name: Douglas Peterson. MRN: [id number] DOB: 1951-08-04 Today's Date: 08/23/2023  History of Present Illness  Douglas Peterson is a 72yoM who comes [facility] ED on 10/13 after multiple falls while intoxicated, ETOH 311 on arrival. Pt showing bruising to bilat face posterior to bilat orbits, severe back pain with mobility, all imaging studies unremarkable. Pt followed by our services for several days, severely limited in mobility compared to typical potential, 10/18 was transferred to ICU with agitation and ?aspiration, then back to floor after stable.Marland KitchenPMH: ETOH abuse, AF, HTN, Rt spastic hemiplegia s/p remote TBI.  Clinical Impression  Pt in bed on entry, RN exiting, nutrition just offered and setup on bedside table. Pt appears groggy, weak, delayed- he is significantly more hypophonic than his baseline, no offering much verbally but when he does is quite difficult to understand. Pt is willing to attempt mobility assessment today, can get to EOB with minA and rise with close guarding for safety, but he fails at achieving a steady standing position, then screams out nonsensically after 15 seconds, difficulty to say whether he lost balance backwards or resolved to return to sitting, but after absent any attempts to regain balance, author assisted back to EOB. Back pain does not seem to be a limitation in mobility as it was previously in the week, however its not clear his mentation/alertness would allow him to convey much detail here. Pt is really not doing well compared to his typical presentation. His alertness and mentation are far off baseline and author can see where he appears to have lost weight recently, still has bruising on both sides of his head from falls 2 weeks prior, generally more pale in face. Will continue to follow and progress as able.       If plan is discharge home, recommend the following: Assist for transportation;Direct supervision/assist for  financial management;Direct supervision/assist for medications management   Can travel by private vehicle        Equipment Recommendations None recommended by PT  Recommendations for Other Services       Functional Status Assessment Patient has had a recent decline in their functional status and demonstrates the ability to make significant improvements in function in a reasonable and predictable amount of time.     Precautions / Restrictions Precautions Precautions: Fall Restrictions Weight Bearing Restrictions: No      Mobility  Bed Mobility Overal bed mobility: Needs Assistance Bed Mobility: Supine to Sit     Supine to sit: Min assist          Transfers Overall transfer level: Needs assistance Equipment used: None Transfers: Sit to/from Stand Sit to Stand: Contact guard assist           General transfer comment: unclear that standing balance is ever established over 15 seconds    Ambulation/Gait                  Stairs            Wheelchair Mobility     Tilt Bed    Modified Rankin (Stroke Patients Only)       Balance                                             Pertinent Vitals/Pain Pain Assessment Pain Assessment: Faces Faces Pain Scale: Hurts even more  Home Living Family/patient expects to be discharged to:: Unsure Living Arrangements:  (multiple option in past admissions, often refuses those most recommended by medical staff.)                      Prior Function Prior Level of Function : Independent/Modified Independent               ADLs Comments: pt states he is currently driving, and can cook for himself     Extremity/Trunk Assessment   Upper Extremity Assessment Upper Extremity Assessment: Generalized weakness;RUE deficits/detail RUE Deficits / Details: flexor synergy, chronic RUE Coordination: decreased fine motor;decreased gross motor    Lower Extremity Assessment Lower  Extremity Assessment: RLE deficits/detail;Generalized weakness    Cervical / Trunk Assessment Cervical / Trunk Assessment: Kyphotic  Communication   Communication Communication: Difficulty communicating thoughts/reduced clarity of speech;Difficulty following commands/understanding (dysphonic throughout) Following commands: Follows one step commands with increased time Cueing Techniques: Verbal cues;Tactile cues;Visual cues  Cognition                                                General Comments      Exercises     Assessment/Plan    PT Assessment Patient needs continued PT services  PT Problem List Decreased strength;Decreased range of motion;Decreased activity tolerance;Decreased balance;Decreased mobility;Impaired tone       PT Treatment Interventions DME instruction;Gait training;Balance training;Stair training;Therapeutic activities;Patient/family education    PT Goals (Current goals can be found in the Care Plan section)  Acute Rehab PT Goals PT Goal Formulation: Patient unable to participate in goal setting    Frequency Min 1X/week     Co-evaluation               AM-PAC PT "6 Clicks" Mobility  Outcome Measure Help needed turning from your back to your side while in a flat bed without using bedrails?: A Lot Help needed moving from lying on your back to sitting on the side of a flat bed without using bedrails?: A Lot Help needed moving to and from a bed to a chair (including a wheelchair)?: A Little Help needed standing up from a chair using your arms (e.g., wheelchair or bedside chair)?: A Little Help needed to walk in hospital room?: Total Help needed climbing 3-5 steps with a railing? : Total 6 Click Score: 12    End of Session   Activity Tolerance: Patient limited by pain;Patient limited by lethargy Patient left: in bed;with call bell/phone within reach;with bed alarm set        Time: 1610-9604 PT Time Calculation (min)  (ACUTE ONLY): 15 min   Charges:   PT Evaluation $PT Re-evaluation: 1 Re-eval   PT General Charges $$ ACUTE PT VISIT: 1 Visit        4:39 PM, 08/23/23 Rosamaria Lints, PT, DPT Physical Therapist - [facility]  [phone] (ASCOM)    Leata Dominy C 08/23/2023, 4:31 PM

## 2023-08-23 NOTE — Progress Notes (Signed)
PROGRESS NOTE    Douglas Peterson.  BMW:[id number] DOB: 11-03-1951 DOA: 08/16/2023 PCP: Pcp, No    Brief Narrative:   72yo with h/o ETOH use d/o, chronic hyponatremia associated with beer potomania, PAF not on AC, TBI, and HTN who presented on 10/13 with ETOH intoxication and a fall.  He had acute on chronic hyponatremia.  He has had 6 ER visits and 8 admissions in the last 6 months.  Recommended for SNF - this would be optimal for him.   [name] declining SNF.  Still exhibiting signs of withdrawal.  Poor ability to swallow.  This was noted previously by SLP.  Currently recommending n.p.o.  Will need to reevaluate swallowing ability and GOC.  Patient has been unwilling or unable to participate in his own discussion.  Daughter not willing to make decisions for him and would prefer not to be contacted unless something disastrous occurs.  10/19: Transferred to ICU yesterday for increasing agitation and possible aspiration event.  Remained stable overnight.  No evidence of acute aspiration.  10/20: No longer exhibiting clinical signs of acute withdrawal however patient remains extremely weak and fatigued.  Evaluated by therapy.  Weakness seems to be worsening.  Unsafe to discharge home at this time.   Assessment & Plan:   Principal Problem:   Hyponatremia Active Problems:   Alcohol intoxication (HCC)   Elevated LFTs   Chronic diastolic CHF (congestive heart failure) (HCC)   Paroxysmal A-fib (HCC)   Alcoholic cirrhosis of liver without ascites (HCC)   Ground-level fall   Protein-calorie malnutrition, severe  Hyponatremia Acute on chronic hyponatremia in the setting of beer potomania and poor nutrition/hydration 500 cc bolus followed by D5-normal saline at 100 cc/h Plan: Hyponatremia improved   Alcohol intoxication  Acute alcohol withdrawal Patient has a history of severe alcohol use disorder coming in with a ethanol level of 311 with signs of intoxication on examination High  risk for withdrawals. Difficult to interpret given resting tremor at baseline and poor baseline mentation Plan: No longer exhibiting signs of withdrawal.  Can complete oral phenobarbital taper.  Continue thiamine, folic acid, multivitamin  Elevated LFTs In the setting of chronic daily alcohol use Minimally elevated at time of admission, now normalized   Chronic diastolic CHF (congestive heart failure) (HCC) Per chart review, patient has a history of diastolic heart failure with last EF of 60-65% in 2021.   Hypovolemic on presentation in the setting of alcohol use disorder, poor nutrition and dehydration He is at risk for alcohol induced cardiomyopathy.    Paroxysmal A-fib (HCC) Regular rhythm on examination with normal ventricular rate Not on AC due to high risk for falls   Alcoholic cirrhosis of liver without ascites (HCC) Per chart review, patient has a history of alcohol induced cirrhosis however his platelets are normal, and INR was normal when checked 2 months ago and 1 year ago. Resume outpatient GI follow-up   Ground-level fall Secondary to acute intoxication and electrolyte derangements.   CT head, C-spine, chest and pelvis with no acute injuries. PT/OT evaluations Recommending SNF.  Patient agreeable to home place.  Does not meet criteria for assisted living facility at this time   Dyslipidemia Continue atorvastatin   Essential hypertension Continue home atenolol   BPH (benign prostatic hyperplasia) Continue Flomax   GOC  Recurrent hospitalizations and ER visits During his last hospitalization (10/3-8), he refused SNF placement and wanted to dc home with [facility] He is again recommended for SNF placement He was agreeable to  home place however he is now max assist and cannot get out of bed.  He is also unsafe to give oral intake given severe oropharyngeal dysphagia at baseline.  High risk for aspiration.  Very difficult position.  At this point recommend further  de-escalation of care and consideration for hospice.  DVT prophylaxis: SQ Lovenox Code Status: Full Family Communication: None today Disposition Plan: Status is: Inpatient Remains inpatient appropriate because: Acute alcohol withdrawal   Level of care: Med-Surg  Consultants:  Palliative care  Procedures:  None  Antimicrobials: None   Subjective: Seen and examined.  Mentation actually improving.  Patient very weak and fatigued  Objective: Vitals:   08/22/23 2100 08/22/23 2155 08/23/23 0100 08/23/23 0331  BP: 118/73 (!) 149/77 125/72 (!) 120/93  Pulse: 66 75 (!) 55 69  Resp: (!) 27 18 (!) 23 18  Temp:  98.2 F (36.8 C) 97.9 F (36.6 C) 98.2 F (36.8 C)  TempSrc:  Oral Axillary Axillary  SpO2: 94% 100% 100% 99%  Weight:      Height:        Intake/Output Summary (Last 24 hours) at 08/23/2023 1435 Last data filed at 08/23/2023 0554 Gross per 24 hour  Intake 1085.92 ml  Output 225 ml  Net 860.92 ml   Filed Weights   08/16/23 0848 08/18/23 0346 08/21/23 1915  Weight: 62 kg 57.4 kg 54.2 kg    Examination:  General exam: Appeared frail and chronically ill Respiratory system: Poor respiratory effort.  Normal work of breathing.  Room air Cardiovascular system: S and S2, RRR, no murmurs, no pedal edema Gastrointestinal system: Thin, soft, NT/ND, normal bowel sounds Central nervous system: Alert, oriented x 2, no focal deficits Extremities: Decreased power bilaterally Skin: No rashes, lesions or ulcers Psychiatry: Judgement and insight appear impaired. Mood & affect flattened.     Data Reviewed: I have personally reviewed following labs and imaging studies  CBC: Recent Labs  Lab 08/17/23 0330 08/18/23 0445 08/19/23 0532 08/22/23 0556  WBC 7.4 7.3 10.8* 6.3  NEUTROABS 3.6 3.6 7.1  --   HGB 14.8 14.7 14.5 14.9  HCT 41.9 41.4 41.4 43.4  MCV 88.4 88.8 90.4 91.6  PLT 219 191 234 209   Basic Metabolic Panel: Recent Labs  Lab 08/17/23 0330  08/17/23 0949 08/18/23 0445 08/19/23 0532 08/22/23 0556  NA 129* 129* 130* 133* 139  K 3.7  --  3.5 3.7 3.7  CL 93*  --  96* 99 104  CO2 27  --  25 24 25   GLUCOSE 123*  --  105* 89 97  BUN 7*  --  <5* 9 16  CREATININE 0.57*  --  0.46* 0.60* 0.47*  CALCIUM 8.5*  --  8.9 9.0 9.3   GFR: Estimated Creatinine Clearance: 64 mL/min (A) (by C-G formula based on SCr of 0.47 mg/dL (L)). Liver Function Tests: Recent Labs  Lab 08/17/23 0330 08/22/23 0556  AST 34 21  ALT 32 18  ALKPHOS 78 68  BILITOT 1.6* 0.6  PROT 7.3 7.9  ALBUMIN 4.2 4.2   No results for input(s): "LIPASE", "AMYLASE" in the last 168 hours. Recent Labs  Lab 08/22/23 0556  AMMONIA 12   Coagulation Profile: No results for input(s): "INR", "PROTIME" in the last 168 hours. Cardiac Enzymes: No results for input(s): "CKTOTAL", "CKMB", "CKMBINDEX", "TROPONINI" in the last 168 hours. BNP (last 3 results) No results for input(s): "PROBNP" in the last 8760 hours. HbA1C: No results for input(s): "HGBA1C" in the last 72 hours.  CBG: Recent Labs  Lab 08/21/23 1909 08/22/23 1211 08/22/23 1547 08/22/23 1918 08/23/23 0332  GLUCAP 114* 93 100* 112* 112*   Lipid Profile: No results for input(s): "CHOL", "HDL", "LDLCALC", "TRIG", "CHOLHDL", "LDLDIRECT" in the last 72 hours. Thyroid Function Tests: Recent Labs    08/22/23 0556  TSH 0.733   Anemia Panel: No results for input(s): "VITAMINB12", "FOLATE", "FERRITIN", "TIBC", "IRON", "RETICCTPCT" in the last 72 hours. Sepsis Labs: No results for input(s): "PROCALCITON", "LATICACIDVEN" in the last 168 hours.  Recent Results (from the past 240 hour(s))  MRSA Next Gen by PCR, Nasal     Status: None   Collection Time: 08/21/23  7:17 PM   Specimen: Nasal Mucosa; Nasal Swab  Result Value Ref Range Status   MRSA by PCR Next Gen NOT DETECTED NOT DETECTED Final    Comment: (NOTE) The GeneXpert MRSA Assay (FDA approved for NASAL specimens only), is one component of a  comprehensive MRSA colonization surveillance program. It is not intended to diagnose MRSA infection nor to guide or monitor treatment for MRSA infections. Test performance is not FDA approved in patients less than 86 years old. Performed at [facility], [address], [place], [place] [address]          Radiology Studies: [facility] Chest [place] 1 View  Result Date: 08/21/2023 CLINICAL DATA:  Cough.  Aspiration. EXAM: PORTABLE CHEST 1 VIEW COMPARISON:  08/19/2023. FINDINGS: Low lung volume. There are probable atelectatic changes at the lung bases. Bilateral lung fields are otherwise clear. No acute consolidation or lung collapse. Bilateral costophrenic angles are clear. Stable cardio-mediastinal silhouette. No acute osseous abnormalities. Bilateral old healed rib fractures and bilateral clavicular fractures noted. The soft tissues are within normal limits. IMPRESSION: 1. Low lung volume. Bibasilar atelectasis. Electronically Signed   By: Jules Schick M.D.   On: 08/21/2023 18:59        Scheduled Meds:  atenolol  12.5 mg Oral Daily   atorvastatin  10 mg Oral Daily   Chlorhexidine Gluconate Cloth  6 each Topical Daily   diazepam  10 mg Intramuscular Once   enoxaparin (LOVENOX) injection  40 mg Subcutaneous Q24H   feeding supplement  237 mL Oral TID BM   lidocaine  1 patch Transdermal Q24H   multivitamin with minerals  1 tablet Oral Daily   mouth rinse  15 mL Mouth Rinse 4 times per day   pantoprazole  40 mg Oral Daily   [START ON 08/24/2023] PHENObarbital  32.4 mg Intravenous TID   PHENObarbital  65 mg Intravenous TID   sodium chloride flush  3 mL Intravenous Q12H   tamsulosin  0.4 mg Oral QPC breakfast   thiamine  100 mg Oral Daily   Or   thiamine  100 mg Intravenous Daily   Continuous Infusions:     LOS: 7 days     Tresa Moore, MD [facility]   If 7PM-7AM, please contact night-coverage  08/23/2023, 2:35 PM

## 2023-08-24 DIAGNOSIS — E871 Hypo-osmolality and hyponatremia: Secondary | ICD-10-CM | POA: Diagnosis not present

## 2023-08-24 NOTE — Progress Notes (Signed)
Physical Therapy Treatment Patient Details Name: Douglas Peterson. MRN: [id number] DOB: 07-Mar-1951 Today's Date: 08/24/2023   History of Present Illness Douglas Peterson is a 72yoM who comes to [facility] ED on 10/13 after multiple falls while intoxicated, ETOH 311 on arrival. Pt showing bruising to bilat face posterior to bilat orbits, severe back pain with mobility, all imaging studies unremarkable. Pt followed by our services for several days, severely limited in mobility compared to typical potential, 10/18 was transferred to ICU with agitation and ?aspiration, then back to floor after stable.Marland KitchenPMH: ETOH abuse, AF, HTN, Rt spastic hemiplegia s/p remote TBI.    PT Comments  Pt found supine in bed, willing to work with therapy this session, putting forth fair effort. He needs Max A at most for all mobility performed today. Once sitting EOB he was able to tolerate ~2 min of seated balance, with intermittent assist to help correct posterior and lateral leaning. Pt able to perform STS with HHA of Mod A, needing Max A to maintain standing and take a few shuffling steps towards HOB. Pt will benefit from continued PT services upon discharge to safely address deficits listed in patient problem list for decreased caregiver assistance and eventual return to PLOF.    If plan is discharge home, recommend the following: Assist for transportation;Direct supervision/assist for financial management;Direct supervision/assist for medications management   Can travel by private vehicle     No  Equipment Recommendations  Other (comment) (TBD at next venue of care)    Recommendations for Other Services       Precautions / Restrictions Precautions Precautions: Fall Restrictions Weight Bearing Restrictions: No     Mobility  Bed Mobility Overal bed mobility: Needs Assistance Bed Mobility: Rolling, Supine to Sit, Sit to Supine Rolling: Min assist   Supine to sit: Min assist Sit to supine: Max assist   General  bed mobility comments: Supine>sit needing asssit to sit upright and for LE management. Needs Max A for sit>supine    Transfers Overall transfer level: Needs assistance Equipment used: 1 person hand held assist Transfers: Sit to/from Stand Sit to Stand: Max assist           General transfer comment: Constant mod A, via HHA, needing additional Max A to maintain balance when attempting to take a few steps    Ambulation/Gait Ambulation/Gait assistance: Max assist Gait Distance (Feet): 2 Feet Assistive device: 1 person hand held assist Gait Pattern/deviations: Trunk flexed, Shuffle Gait velocity: decreased     General Gait Details: no true functional steps taken, needing Max A to maintain standing, only able to achieve shuffling with no foot clearance this session.   Stairs             Wheelchair Mobility     Tilt Bed    Modified Rankin (Stroke Patients Only)       Balance Overall balance assessment: Needs assistance Sitting-balance support: Feet supported, Bilateral upper extremity supported Sitting balance-Leahy Scale: Fair Sitting balance - Comments: intermittent assist to stay balanced Postural control: Posterior lean, Right lateral lean Standing balance support: Single extremity supported, During functional activity Standing balance-Leahy Scale: Poor Standing balance comment: breif standing, needing constant asssit                            Cognition Arousal: Lethargic Behavior During Therapy: Flat affect Overall Cognitive Status: No family/caregiver present to determine baseline cognitive functioning  General Comments: Pt continues to be lethargic this session, unable to clarify/vocalize needs        Exercises Other Exercises Other Exercises: Rolling x2 to each side with min A for set up, pt able to hold position    General Comments        Pertinent Vitals/Pain Pain Assessment Pain  Assessment: Faces Faces Pain Scale: Hurts little more Pain Descriptors / Indicators: Discomfort, Grimacing, Sharp, Constant Pain Intervention(s): Monitored during session, Limited activity within patient's tolerance    Home Living                          Prior Function            PT Goals (current goals can now be found in the care plan section) Progress towards PT goals: Progressing toward goals    Frequency    Min 1X/week      PT Plan      Co-evaluation              AM-PAC PT "6 Clicks" Mobility   Outcome Measure  Help needed turning from your back to your side while in a flat bed without using bedrails?: A Little Help needed moving from lying on your back to sitting on the side of a flat bed without using bedrails?: A Lot Help needed moving to and from a bed to a chair (including a wheelchair)?: A Lot Help needed standing up from a chair using your arms (e.g., wheelchair or bedside chair)?: A Lot Help needed to walk in hospital room?: Total Help needed climbing 3-5 steps with a railing? : Total 6 Click Score: 11    End of Session Equipment Utilized During Treatment: Gait belt Activity Tolerance: Patient limited by lethargy Patient left: in bed;with call bell/phone within reach;with bed alarm set Nurse Communication: Mobility status PT Visit Diagnosis: Muscle weakness (generalized) (M62.81);Other abnormalities of gait and mobility (R26.89)     Time: 4742-5956 PT Time Calculation (min) (ACUTE ONLY): 17 min  Charges:                            Cecile Sheerer, SPT 08/24/23, 5:17 PM

## 2023-08-24 NOTE — Plan of Care (Signed)

## 2023-08-24 NOTE — Progress Notes (Signed)
PROGRESS NOTE    Douglas Peterson.  OZD:[id number] DOB: December 21, 1950 DOA: 08/16/2023 PCP: Pcp, No    Brief Narrative:   72yo with h/o ETOH use d/o, chronic hyponatremia associated with beer potomania, PAF not on AC, TBI, and HTN who presented on 10/13 with ETOH intoxication and a fall.  He had acute on chronic hyponatremia.  He has had 6 ER visits and 8 admissions in the last 6 months.  Recommended for SNF - this would be optimal for him.   [name] declining SNF.  Still exhibiting signs of withdrawal.  Poor ability to swallow.  This was noted previously by SLP.  Currently recommending n.p.o.  Will need to reevaluate swallowing ability and GOC.  Patient has been unwilling or unable to participate in his own discussion.  Daughter not willing to make decisions for him and would prefer not to be contacted unless something disastrous occurs.  10/19: Transferred to ICU yesterday for increasing agitation and possible aspiration event.  Remained stable overnight.  No evidence of acute aspiration.  10/20: No longer exhibiting clinical signs of acute withdrawal however patient remains extremely weak and fatigued.  Evaluated by therapy.  Weakness seems to be worsening.  Unsafe to discharge home at this time.  10/21: Diet advanced after conversation regarding CODE STATUS.  Patient now DNR.  Seems to be tolerating.  Still very unsteady on the feet.   Assessment & Plan:   Principal Problem:   Hyponatremia Active Problems:   Alcohol intoxication (HCC)   Elevated LFTs   Chronic diastolic CHF (congestive heart failure) (HCC)   Paroxysmal A-fib (HCC)   Alcoholic cirrhosis of liver without ascites (HCC)   Ground-level fall   Protein-calorie malnutrition, severe  Hyponatremia Acute on chronic hyponatremia in the setting of beer potomania and poor nutrition/hydration 500 cc bolus followed by D5-normal saline at 100 cc/h Plan: Hyponatremia improved   Alcohol intoxication  Acute alcohol  withdrawal Patient has a history of severe alcohol use disorder coming in with a ethanol level of 311 with signs of intoxication on examination High risk for withdrawals. Difficult to interpret given resting tremor at baseline and poor baseline mentation Plan: No longer exhibiting signs of withdrawal.   Can complete oral phenobarbital taper.   Continue thiamine, folic acid, multivitamin long-term  Elevated LFTs In the setting of chronic daily alcohol use Minimally elevated at time of admission, now normalized   Chronic diastolic CHF (congestive heart failure) (HCC) Per chart review, patient has a history of diastolic heart failure with last EF of 60-65% in 2021.   Hypovolemic on presentation in the setting of alcohol use disorder, poor nutrition and dehydration He is at risk for alcohol induced cardiomyopathy.    Paroxysmal A-fib (HCC) Regular rhythm on examination with normal ventricular rate Not on AC due to high risk for falls   Alcoholic cirrhosis of liver without ascites (HCC) Per chart review, patient has a history of alcohol induced cirrhosis however his platelets are normal, and INR was normal when checked 2 months ago and 1 year ago. Resume outpatient GI follow-up   Ground-level fall Secondary to acute intoxication and electrolyte derangements.   CT head, C-spine, chest and pelvis with no acute injuries. PT/OT evaluations Recommending SNF.  Patient now agreeable has been has chosen [facility] as top choice   Dyslipidemia Continue atorvastatin   Essential hypertension Continue home atenolol   BPH (benign prostatic hyperplasia) Continue Flomax   GOC  Recurrent hospitalizations and ER visits During his last hospitalization (  10/3-8), he refused SNF placement and wanted to dc home with [facility] He is again recommended for SNF placement He was agreeable to home place however he is now max assist and cannot get out of bed.  He is also unsafe to give oral intake given  severe oropharyngeal dysphagia at baseline.  High risk for aspiration.  Very difficult position.  At this point recommend further de-escalation of care and consideration for hospice. Patient not yet ready for hospice care.  Lengthy discussion on 10/20 regarding his CODE STATUS.  I explained that we could not safely feed him because he was very high risk for aspiration and respiratory compromise.  He states that he wishes to eat with the knowledge that he may aspirate and is now DNR/DNI.  DVT prophylaxis: SQ Lovenox Code Status: Full Family Communication: None today Disposition Plan: Status is: Inpatient Remains inpatient appropriate because: Acute alcohol withdrawal   Level of care: Med-Surg  Consultants:  Palliative care  Procedures:  None  Antimicrobials: None   Subjective: Seen and examined.  Mentation waxes and wanes but acceptable this morning.  Remains weak and fatigued.  Objective: Vitals:   08/24/23 0342 08/24/23 0749 08/24/23 1235 08/24/23 1506  BP: 125/74 137/77 132/75 125/77  Pulse: (!) 57 61 62 62  Resp: 18 20 18 19   Temp: 97.9 F (36.6 C) 97.8 F (36.6 C) 98 F (36.7 C) 98.5 F (36.9 C)  TempSrc: Axillary Oral Oral Oral  SpO2: 97% 99% 96% 96%  Weight:      Height:        Intake/Output Summary (Last 24 hours) at 08/24/2023 1512 Last data filed at 08/24/2023 1038 Gross per 24 hour  Intake 240 ml  Output 200 ml  Net 40 ml   Filed Weights   08/16/23 0848 08/18/23 0346 08/21/23 1915  Weight: 62 kg 57.4 kg 54.2 kg    Examination:  General exam: Frail, chronically ill-appearing Respiratory system: Bibasilar crackles.  Normal work of breathing.  Room air Cardiovascular system: S and S2, RRR, no murmurs, no pedal edema Gastrointestinal system: Thin, soft, NT/ND, normal bowel sounds Central nervous system: Alert, oriented x 2, no focal deficits Extremities: Decreased power bilaterally Skin: No rashes, lesions or ulcers Psychiatry: Judgement and  insight appear impaired. Mood & affect flattened.     Data Reviewed: I have personally reviewed following labs and imaging studies  CBC: Recent Labs  Lab 08/18/23 0445 08/19/23 0532 08/22/23 0556  WBC 7.3 10.8* 6.3  NEUTROABS 3.6 7.1  --   HGB 14.7 14.5 14.9  HCT 41.4 41.4 43.4  MCV 88.8 90.4 91.6  PLT 191 234 209   Basic Metabolic Panel: Recent Labs  Lab 08/18/23 0445 08/19/23 0532 08/22/23 0556  NA 130* 133* 139  K 3.5 3.7 3.7  CL 96* 99 104  CO2 25 24 25   GLUCOSE 105* 89 97  BUN <5* 9 16  CREATININE 0.46* 0.60* 0.47*  CALCIUM 8.9 9.0 9.3   GFR: Estimated Creatinine Clearance: 64 mL/min (A) (by C-G formula based on SCr of 0.47 mg/dL (L)). Liver Function Tests: Recent Labs  Lab 08/22/23 0556  AST 21  ALT 18  ALKPHOS 68  BILITOT 0.6  PROT 7.9  ALBUMIN 4.2   No results for input(s): "LIPASE", "AMYLASE" in the last 168 hours. Recent Labs  Lab 08/22/23 0556  AMMONIA 12   Coagulation Profile: No results for input(s): "INR", "PROTIME" in the last 168 hours. Cardiac Enzymes: No results for input(s): "CKTOTAL", "CKMB", "CKMBINDEX", "TROPONINI" in  the last 168 hours. BNP (last 3 results) No results for input(s): "PROBNP" in the last 8760 hours. HbA1C: No results for input(s): "HGBA1C" in the last 72 hours. CBG: Recent Labs  Lab 08/22/23 1211 08/22/23 1547 08/22/23 1918 08/23/23 0332 08/23/23 2056  GLUCAP 93 100* 112* 112* 90   Lipid Profile: No results for input(s): "CHOL", "HDL", "LDLCALC", "TRIG", "CHOLHDL", "LDLDIRECT" in the last 72 hours. Thyroid Function Tests: Recent Labs    08/22/23 0556  TSH 0.733   Anemia Panel: No results for input(s): "VITAMINB12", "FOLATE", "FERRITIN", "TIBC", "IRON", "RETICCTPCT" in the last 72 hours. Sepsis Labs: No results for input(s): "PROCALCITON", "LATICACIDVEN" in the last 168 hours.  Recent Results (from the past 240 hour(s))  MRSA Next Gen by PCR, Nasal     Status: None   Collection Time: 08/21/23   7:17 PM   Specimen: Nasal Mucosa; Nasal Swab  Result Value Ref Range Status   MRSA by PCR Next Gen NOT DETECTED NOT DETECTED Final    Comment: (NOTE) The GeneXpert MRSA Assay (FDA approved for NASAL specimens only), is one component of a comprehensive MRSA colonization surveillance program. It is not intended to diagnose MRSA infection nor to guide or monitor treatment for MRSA infections. Test performance is not FDA approved in patients less than 45 years old. Performed at [facility], [address], [place], [place] [address]          Radiology Studies: No results found.      Scheduled Meds:  atenolol  12.5 mg Oral Daily   atorvastatin  10 mg Oral Daily   diazepam  10 mg Intramuscular Once   enoxaparin (LOVENOX) injection  40 mg Subcutaneous Q24H   feeding supplement  237 mL Oral TID BM   lidocaine  1 patch Transdermal Q24H   multivitamin with minerals  1 tablet Oral Daily   mouth rinse  15 mL Mouth Rinse 4 times per day   pantoprazole  40 mg Oral Daily   PHENObarbital  32.4 mg Intravenous TID   sodium chloride flush  3 mL Intravenous Q12H   tamsulosin  0.4 mg Oral QPC breakfast   thiamine  100 mg Oral Daily   Or   thiamine  100 mg Intravenous Daily   Continuous Infusions:     LOS: 8 days     Tresa Moore, MD [facility]   If 7PM-7AM, please contact night-coverage  08/24/2023, 3:12 PM

## 2023-08-24 NOTE — TOC Progression Note (Signed)
Transition of Care (TOC) - Progression Note    Patient Details  Name: Douglas Peterson. MRN: [id number] Date of Birth: 1951/04/07  Transition of Care [facility]) CM/SW Contact  Truddie Hidden, RN Phone Number: 08/24/2023, 2:40 PM  Clinical Narrative:    Spoke with patient at bedside regarding therapy's recommendation. Patient requested a list of facilities for SNF's.   2:43pm Spoke with patient regarding choice of SNF. He has chosen [facility] his first choice. Bed search started.    Expected Discharge Plan: Home w Home Health Services Barriers to Discharge: Continued Medical Work up  Expected Discharge Plan and Services         Expected Discharge Date: 08/21/23                                     Social Determinants of Health (SDOH) Interventions SDOH Screenings   Food Insecurity: Food Insecurity Present (08/19/2023)  Housing: High Risk (08/19/2023)  Transportation Needs: Unmet Transportation Needs (08/19/2023)  Utilities: At Risk (08/19/2023)  Financial Resource Strain: Unknown (06/13/2019)  Physical Activity: Unknown (06/13/2019)  Social Connections: Unknown (06/13/2019)  Stress: Unknown (06/13/2019)  Tobacco Use: Medium Risk (08/16/2023)    Readmission Risk Interventions    06/17/2023    4:23 PM 04/29/2022   10:26 AM 03/14/2022   12:48 PM  Readmission Risk Prevention Plan  Transportation Screening Complete Complete Complete  PCP or Specialist Appt within 3-5 Days   Complete  HRI or Home Care Consult Complete Complete Complete  Social Work Consult for Recovery Care Planning/Counseling Complete Complete Complete  Palliative Care Screening Not Applicable Not Applicable Not Applicable  Medication Review Oceanographer) Complete Complete Complete

## 2023-08-24 NOTE — Progress Notes (Signed)
Mobility Specialist - Progress Note   08/24/23 1200  Mobility  Activity Ambulated with assistance in hallway  Level of Assistance Moderate assist, patient does 50-74%  Assistive Device Other (Comment) (S HHA)  Distance Ambulated (ft) 160 ft  Activity Response Tolerated well  $Mobility charge 1 Mobility     Pt lying in bed upon arrival, utilizing RA. Pt agreeable to activity. Completed bed mobility, STS, and ambulation with modA +1. Single UE support to simulate cane + gait belt. Unsteady in standing, assist to support balance. Forward flex lean as pt begins to fatigue during last 25' requiring increased assist. Pt returned supine with assist onto LE. Pt left in bed with alarm set, needs in reach.    [name] Mobility Specialist 08/24/23, 12:21 PM

## 2023-08-25 DIAGNOSIS — E871 Hypo-osmolality and hyponatremia: Secondary | ICD-10-CM | POA: Diagnosis not present

## 2023-08-25 MED ORDER — THIAMINE HCL 100 MG/ML IJ SOLN
500.0000 mg | Freq: Three times a day (TID) | INTRAVENOUS | Status: DC
  Administered 2023-08-25 – 2023-08-27 (×8): 500 mg via INTRAVENOUS
  Filled 2023-08-25 (×10): qty 5

## 2023-08-25 MED ORDER — THIAMINE HCL 100 MG/ML IJ SOLN: 300.0000 mg | Freq: Three times a day (TID) | INTRAVENOUS | Status: DC

## 2023-08-25 MED ORDER — ZIPRASIDONE MESYLATE 20 MG IM SOLR
10.0000 mg | INTRAMUSCULAR | Status: DC | PRN
  Administered 2023-08-25 – 2023-08-26 (×2): 10 mg via INTRAMUSCULAR
  Filled 2023-08-25 (×3): qty 20

## 2023-08-25 MED ORDER — LABETALOL HCL 5 MG/ML IV SOLN: 20.0000 mg | INTRAVENOUS | Status: DC | PRN

## 2023-08-25 NOTE — Progress Notes (Signed)
PROGRESS NOTE    Douglas Peterson.  DGL:[id number] DOB: 03/14/1951 DOA: 08/16/2023 PCP: Pcp, No    Brief Narrative:   72yo with h/o ETOH use d/o, chronic hyponatremia associated with beer potomania, PAF not on AC, TBI, and HTN who presented on 10/13 with ETOH intoxication and a fall.  He had acute on chronic hyponatremia.  He has had 6 ER visits and 8 admissions in the last 6 months.  Recommended for SNF - this would be optimal for him.   [name] declining SNF.  Still exhibiting signs of withdrawal.  Poor ability to swallow.  This was noted previously by SLP.  Currently recommending n.p.o.  Will need to reevaluate swallowing ability and GOC.  Patient has been unwilling or unable to participate in his own discussion.  Daughter not willing to make decisions for him and would prefer not to be contacted unless something disastrous occurs.  10/19: Transferred to ICU yesterday for increasing agitation and possible aspiration event.  Remained stable overnight.  No evidence of acute aspiration.  10/20: No longer exhibiting clinical signs of acute withdrawal however patient remains extremely weak and fatigued.  Evaluated by therapy.  Weakness seems to be worsening.  Unsafe to discharge home at this time.  10/21: Diet advanced after conversation regarding CODE STATUS.  Patient now DNR.  Seems to be tolerating.  Still very unsteady on the feet.  10/22: Patient has been somewhat agitated over the last 24 hours.  Has called the cops twice stating we are holding him hostage.   Assessment & Plan:   Principal Problem:   Hyponatremia Active Problems:   Alcohol intoxication (HCC)   Elevated LFTs   Chronic diastolic CHF (congestive heart failure) (HCC)   Paroxysmal A-fib (HCC)   Alcoholic cirrhosis of liver without ascites (HCC)   Ground-level fall   Protein-calorie malnutrition, severe  Hyponatremia Acute on chronic hyponatremia in the setting of beer potomania and poor nutrition/hydration 500  cc bolus followed by D5-normal saline at 100 cc/h Plan: Hyponatremia improved Recheck sodium in a.m. and 10/23   Alcohol intoxication  Acute alcohol withdrawal Patient has a history of severe alcohol use disorder coming in with a ethanol level of 311 with signs of intoxication on examination High risk for withdrawals. Difficult to interpret given resting tremor at baseline and poor baseline mentation Plan: No longer exhibiting signs of withdrawal.   Completed oral phenobarbital taper Continue thiamine, folic acid, multivitamin long-term  Elevated LFTs In the setting of chronic daily alcohol use Minimally elevated at time of admission, now normalized   Chronic diastolic CHF (congestive heart failure) (HCC) Per chart review, patient has a history of diastolic heart failure with last EF of 60-65% in 2021.   Hypovolemic on presentation in the setting of alcohol use disorder, poor nutrition and dehydration He is at risk for alcohol induced cardiomyopathy.    Paroxysmal A-fib (HCC) Regular rhythm on examination with normal ventricular rate Not on AC due to high risk for falls   Alcoholic cirrhosis of liver without ascites (HCC) Per chart review, patient has a history of alcohol induced cirrhosis however his platelets are normal, and INR was normal when checked 2 months ago and 1 year ago. Resume outpatient GI follow-up   Ground-level fall Secondary to acute intoxication and electrolyte derangements.   CT head, C-spine, chest and pelvis with no acute injuries. PT/OT evaluations Recommending SNF.   Patient now agreeable has been has chosen [facility] as top choice   Dyslipidemia Continue atorvastatin  Essential hypertension Continue home atenolol   BPH (benign prostatic hyperplasia) Continue Flomax   GOC  Recurrent hospitalizations and ER visits During his last hospitalization (10/3-8), he refused SNF placement and wanted to dc [facility] with [facility] He is again recommended for  SNF placement He was agreeable to home place however he is now max assist and cannot get out of bed.  He is also unsafe to give oral intake given severe oropharyngeal dysphagia at baseline.  High risk for aspiration.  Very difficult position.  At this point recommend further de-escalation of care and consideration for hospice. Patient not yet ready for hospice care.  Lengthy discussion on 10/20 regarding his CODE STATUS.  I explained that we could not safely feed him because he was very high risk for aspiration and respiratory compromise.  He states that he wishes to eat with the knowledge that he may aspirate and is now DNR/DNI.  DVT prophylaxis: SQ Lovenox Code Status: Full Family Communication: None today Disposition Plan: Status is: Inpatient Remains inpatient appropriate because: Acute alcohol withdrawal   Level of care: Med-Surg  Consultants:  Palliative care  Procedures:  None  Antimicrobials: None   Subjective: Seen and examined.  Agitated this morning.  Remains chronically ill-appearing  Objective: Vitals:   08/24/23 0749 08/24/23 1235 08/24/23 1506 08/25/23 0303  BP: 137/77 132/75 125/77 99/64  Pulse: 61 62 62 (!) 59  Resp: 20 18 19 17   Temp: 97.8 F (36.6 C) 98 F (36.7 C) 98.5 F (36.9 C) 97.9 F (36.6 C)  TempSrc: Oral Oral Oral   SpO2: 99% 96% 96% 96%  Weight:      Height:        Intake/Output Summary (Last 24 hours) at 08/25/2023 1243 Last data filed at 08/25/2023 0120 Gross per 24 hour  Intake 270 ml  Output --  Net 270 ml   Filed Weights   08/16/23 0848 08/18/23 0346 08/21/23 1915  Weight: 62 kg 57.4 kg 54.2 kg    Examination:  General exam: Frail agitated Respiratory system: Bibasilar crackles.  Normal work of breathing.  Room air Cardiovascular system: S and S2, RRR, no murmurs, no pedal edema Gastrointestinal system: Thin, soft, NT/ND, normal bowel sounds Central nervous system: Alert.  Unable to assess orientation.  No focal  deficits Extremities: Decreased power bilaterally Skin: No rashes, lesions or ulcers Psychiatry: Judgement and insight appear impaired. Mood & affect agitated.     Data Reviewed: I have personally reviewed following labs and imaging studies  CBC: Recent Labs  Lab 08/19/23 0532 08/22/23 0556  WBC 10.8* 6.3  NEUTROABS 7.1  --   HGB 14.5 14.9  HCT 41.4 43.4  MCV 90.4 91.6  PLT 234 209   Basic Metabolic Panel: Recent Labs  Lab 08/19/23 0532 08/22/23 0556  NA 133* 139  K 3.7 3.7  CL 99 104  CO2 24 25  GLUCOSE 89 97  BUN 9 16  CREATININE 0.60* 0.47*  CALCIUM 9.0 9.3   GFR: Estimated Creatinine Clearance: 64 mL/min (A) (by C-G formula based on SCr of 0.47 mg/dL (L)). Liver Function Tests: Recent Labs  Lab 08/22/23 0556  AST 21  ALT 18  ALKPHOS 68  BILITOT 0.6  PROT 7.9  ALBUMIN 4.2   No results for input(s): "LIPASE", "AMYLASE" in the last 168 hours. Recent Labs  Lab 08/22/23 0556  AMMONIA 12   Coagulation Profile: No results for input(s): "INR", "PROTIME" in the last 168 hours. Cardiac Enzymes: No results for input(s): "CKTOTAL", "CKMB", "  CKMBINDEX", "TROPONINI" in the last 168 hours. BNP (last 3 results) No results for input(s): "PROBNP" in the last 8760 hours. HbA1C: No results for input(s): "HGBA1C" in the last 72 hours. CBG: Recent Labs  Lab 08/22/23 1211 08/22/23 1547 08/22/23 1918 08/23/23 0332 08/23/23 2056  GLUCAP 93 100* 112* 112* 90   Lipid Profile: No results for input(s): "CHOL", "HDL", "LDLCALC", "TRIG", "CHOLHDL", "LDLDIRECT" in the last 72 hours. Thyroid Function Tests: No results for input(s): "TSH", "T4TOTAL", "FREET4", "T3FREE", "THYROIDAB" in the last 72 hours.  Anemia Panel: No results for input(s): "VITAMINB12", "FOLATE", "FERRITIN", "TIBC", "IRON", "RETICCTPCT" in the last 72 hours. Sepsis Labs: No results for input(s): "PROCALCITON", "LATICACIDVEN" in the last 168 hours.  Recent Results (from the past 240 hour(s))   MRSA Next Gen by PCR, Nasal     Status: None   Collection Time: 08/21/23  7:17 PM   Specimen: Nasal Mucosa; Nasal Swab  Result Value Ref Range Status   MRSA by PCR Next Gen NOT DETECTED NOT DETECTED Final    Comment: (NOTE) The GeneXpert MRSA Assay (FDA approved for NASAL specimens only), is one component of a comprehensive MRSA colonization surveillance program. It is not intended to diagnose MRSA infection nor to guide or monitor treatment for MRSA infections. Test performance is not FDA approved in patients less than 42 years old. Performed at [facility], [address] [place], [place] [address]          Radiology Studies: No results found.      Scheduled Meds:  atenolol  12.5 mg Oral Daily   atorvastatin  10 mg Oral Daily   diazepam  10 mg Intramuscular Once   enoxaparin (LOVENOX) injection  40 mg Subcutaneous Q24H   feeding supplement  237 mL Oral TID BM   lidocaine  1 patch Transdermal Q24H   multivitamin with minerals  1 tablet Oral Daily   mouth rinse  15 mL Mouth Rinse 4 times per day   pantoprazole  40 mg Oral Daily   PHENObarbital  32.4 mg Intravenous TID   sodium chloride flush  3 mL Intravenous Q12H   tamsulosin  0.4 mg Oral QPC breakfast   Continuous Infusions:  thiamine (VITAMIN B1) injection        LOS: 9 days     Tresa Moore, MD [facility]   If 7PM-7AM, please contact night-coverage  08/25/2023, 12:43 PM

## 2023-08-25 NOTE — TOC Progression Note (Signed)
Transition of Care (TOC) - Progression Note    Patient Details  Name: Lin Krook. MRN: [id number] Date of Birth: 1951/03/18  Transition of Care [facility]) CM/SW Contact  Truddie Hidden, RN Phone Number: 08/25/2023, 1:47 PM  Clinical Narrative:    No bed offers.    Expected Discharge Plan: Home w Home Health Services Barriers to Discharge: Continued Medical Work up  Expected Discharge Plan and Services         Expected Discharge Date: 08/21/23                                     Social Determinants of Health (SDOH) Interventions SDOH Screenings   Food Insecurity: Food Insecurity Present (08/19/2023)  Housing: High Risk (08/19/2023)  Transportation Needs: Unmet Transportation Needs (08/19/2023)  Utilities: At Risk (08/19/2023)  Financial Resource Strain: Unknown (06/13/2019)  Physical Activity: Unknown (06/13/2019)  Social Connections: Unknown (06/13/2019)  Stress: Unknown (06/13/2019)  Tobacco Use: Medium Risk (08/16/2023)    Readmission Risk Interventions    06/17/2023    4:23 PM 04/29/2022   10:26 AM 03/14/2022   12:48 PM  Readmission Risk Prevention Plan  Transportation Screening Complete Complete Complete  PCP or Specialist Appt within 3-5 Days   Complete  HRI or Home Care Consult Complete Complete Complete  Social Work Consult for Recovery Care Planning/Counseling Complete Complete Complete  Palliative Care Screening Not Applicable Not Applicable Not Applicable  Medication Review Oceanographer) Complete Complete Complete

## 2023-08-26 DIAGNOSIS — E871 Hypo-osmolality and hyponatremia: Secondary | ICD-10-CM | POA: Diagnosis not present

## 2023-08-26 LAB — BASIC METABOLIC PANEL
Anion gap: 9 (ref 5–15)
BUN: 12 mg/dL (ref 8–23)
CO2: 24 mmol/L (ref 22–32)
Calcium: 9 mg/dL (ref 8.9–10.3)
Chloride: 105 mmol/L (ref 98–111)
Creatinine, Ser: 0.54 mg/dL — ABNORMAL LOW (ref 0.61–1.24)
GFR, Estimated: >60 mL/min (ref >60)
Glucose, Bld: 85 mg/dL (ref 70–99)
Potassium: 3.5 mmol/L (ref 3.5–5.1)
Sodium: 138 mmol/L (ref 135–145)

## 2023-08-26 LAB — CBC WITH DIFFERENTIAL/PLATELET
Abs Immature Granulocytes: 0.01 10*3/uL (ref 0.00–0.07)
Basophils Absolute: 0 10*3/uL (ref 0.0–0.1)
Basophils Relative: 1 %
Eosinophils Absolute: 0.1 10*3/uL (ref 0.0–0.5)
Eosinophils Relative: 2 %
HCT: 41.5 % (ref 39.0–52.0)
Hemoglobin: 14.1 g/dL (ref 13.0–17.0)
Immature Granulocytes: 0 %
Lymphocytes Relative: 52 %
Lymphs Abs: 3.7 10*3/uL (ref 0.7–4.0)
MCH: 30.8 pg (ref 26.0–34.0)
MCHC: 34 g/dL (ref 30.0–36.0)
MCV: 90.6 fL (ref 80.0–100.0)
Monocytes Absolute: 0.5 10*3/uL (ref 0.1–1.0)
Monocytes Relative: 8 %
Neutro Abs: 2.5 10*3/uL (ref 1.7–7.7)
Neutrophils Relative %: 37 %
Platelets: 220 10*3/uL (ref 150–400)
RBC: 4.58 MIL/uL (ref 4.22–5.81)
RDW: 12 % (ref 11.5–15.5)
WBC: 6.9 10*3/uL (ref 4.0–10.5)
nRBC: 0 % (ref 0.0–0.2)

## 2023-08-26 MED ORDER — BACLOFEN 10 MG PO TABS
5.0000 mg | ORAL_TABLET | Freq: Once | ORAL | Status: AC
  Administered 2023-08-26: 5 mg via ORAL
  Filled 2023-08-26: qty 0.5

## 2023-08-26 MED ORDER — ACETAMINOPHEN 325 MG PO TABS
650.0000 mg | ORAL_TABLET | Freq: Four times a day (QID) | ORAL | Status: DC | PRN
  Administered 2023-08-26 – 2023-08-28 (×5): 650 mg via ORAL
  Filled 2023-08-26 (×6): qty 2

## 2023-08-26 MED ORDER — ACETAMINOPHEN 650 MG RE SUPP: 650.0000 mg | Freq: Four times a day (QID) | RECTAL | Status: DC | PRN

## 2023-08-26 MED ORDER — PROSOURCE PLUS PO LIQD
30.0000 mL | Freq: Two times a day (BID) | ORAL | Status: DC
  Administered 2023-08-26 – 2023-08-28 (×5): 30 mL via ORAL
  Filled 2023-08-26: qty 30

## 2023-08-26 NOTE — Progress Notes (Signed)
Physical Therapy Treatment Patient Details Name: Douglas Peterson. MRN: [id number] DOB: 04-Sep-1951 Today's Date: 08/26/2023   History of Present Illness Douglas Peterson is a 72yoM who comes to [facility] ED on 10/13 after multiple falls while intoxicated, ETOH 311 on arrival. Pt showing bruising to bilat face posterior to bilat orbits, severe back pain with mobility, all imaging studies unremarkable. Pt followed by our services for several days, severely limited in mobility compared to typical potential, 10/18 was transferred to ICU with agitation and ?aspiration, then back to floor after stable.Marland KitchenPMH: ETOH abuse, AF, HTN, Rt spastic hemiplegia s/p remote TBI.    PT Comments  Pt seen with OT for slight overlap during session for pt and therapist safety during functional mobility and gait training. Pt required less functional assist for bed mobility and transfers this date. Definite high risk for falls while completing gait training due to +LOB while wt shifting and advancing R LE forward with support of SPC. May try gait training with shoes on to facilitate balance. Pt tolerated ~30 minutes up in chair for lunch prior to requesting assist back to bed due to back pain, nursing notified. Continue PT per POC.   If plan is discharge home, recommend the following: Assist for transportation;Direct supervision/assist for financial management;Direct supervision/assist for medications management   Can travel by private vehicle     No  Equipment Recommendations  Other (comment) (TBD at next level of care)    Recommendations for Other Services       Precautions / Restrictions Precautions Precautions: Fall Restrictions Weight Bearing Restrictions: No     Mobility  Bed Mobility Overal bed mobility: Needs Assistance Bed Mobility: Supine to Sit     Supine to sit: Contact guard     General bed mobility comments: Less hands on assist needed this date    Transfers Overall transfer level: Needs  assistance Equipment used: 1 person hand held assist Transfers: Sit to/from Stand Sit to Stand: Min assist           General transfer comment: Pt more alert today, able to stand with increased time    Ambulation/Gait Ambulation/Gait assistance: Min assist, Mod assist Gait Distance (Feet): 85 Feet Assistive device: Straight cane Gait Pattern/deviations: Trunk flexed, Shuffle, Staggering left, Staggering right, Leaning posteriorly Gait velocity: decreased     General Gait Details: High fall risk primarily when initiating step with R LE, regained with Min/ModA to prevent fall   Stairs             Wheelchair Mobility     Tilt Bed    Modified Rankin (Stroke Patients Only)       Balance Overall balance assessment: Needs assistance Sitting-balance support: Feet supported, Bilateral upper extremity supported Sitting balance-Leahy Scale: Fair     Standing balance support: Single extremity supported, During functional activity Standing balance-Leahy Scale: Poor Standing balance comment: high fall risk during gait training                            Cognition Arousal: Alert                             Following Commands: Follows one step commands with increased time Safety/Judgement: Decreased awareness of safety, Decreased awareness of deficits   Problem Solving: Slow processing, Requires verbal cues, Requires tactile cues, Decreased initiation General Comments: More alert this day with incresed tolerance for activity  Exercises      General Comments General comments (skin integrity, edema, etc.): Pt educated on role of PT and benefits of sitting up in chair for lunch post session.      Pertinent Vitals/Pain Pain Assessment Pain Assessment: Faces Faces Pain Scale: Hurts even more Pain Location: back Pain Descriptors / Indicators: Discomfort, Grimacing, Sharp, Constant Pain Intervention(s): Limited activity within  patient's tolerance, Repositioned, Patient requesting pain meds-RN notified    Home Living                          Prior Function            PT Goals (current goals can now be found in the care plan section) Acute Rehab PT Goals Patient Stated Goal: to go home Progress towards PT goals: Progressing toward goals    Frequency    Min 1X/week      PT Plan      Co-evaluation PT/OT/SLP Co-Evaluation/Treatment: Yes Reason for Co-Treatment: Complexity of the patient's impairments (multi-system involvement);For patient/therapist safety PT goals addressed during session: Mobility/safety with mobility;Balance        AM-PAC PT "6 Clicks" Mobility   Outcome Measure  Help needed turning from your back to your side while in a flat bed without using bedrails?: A Little Help needed moving from lying on your back to sitting on the side of a flat bed without using bedrails?: A Lot Help needed moving to and from a bed to a chair (including a wheelchair)?: A Little Help needed standing up from a chair using your arms (e.g., wheelchair or bedside chair)?: A Little Help needed to walk in hospital room?: A Lot Help needed climbing 3-5 steps with a railing? : Total 6 Click Score: 14    End of Session         PT Visit Diagnosis: Muscle weakness (generalized) (M62.81);Other abnormalities of gait and mobility (R26.89)     Time: 0272-5366 PT Time Calculation (min) (ACUTE ONLY): 25 min  Charges:    $Therapeutic Activity: 8-22 mins PT General Charges $$ ACUTE PT VISIT: 1 Visit                    Zadie Cleverly, PTA  Jannet Askew 08/26/2023, 3:01 PM

## 2023-08-26 NOTE — TOC Progression Note (Signed)
Transition of Care (TOC) - Progression Note    Patient Details  Name: Douglas Peterson. MRN: [id number] Date of Birth: Jun 13, 1951  Transition of Care [facility]) CM/SW Contact  Truddie Hidden, RN Phone Number: 08/26/2023, 4:30 PM  Clinical Narrative:    Per Tammy at  [facility]. Patient offered bed.   Spoke with patient at bedside. He is agreeable to bed offer at [facility].    Expected Discharge Plan: Home w Home Health Services Barriers to Discharge: Continued Medical Work up  Expected Discharge Plan and Services         Expected Discharge Date: 08/21/23                                     Social Determinants of Health (SDOH) Interventions SDOH Screenings   Food Insecurity: Food Insecurity Present (08/19/2023)  Housing: High Risk (08/19/2023)  Transportation Needs: Unmet Transportation Needs (08/19/2023)  Utilities: At Risk (08/19/2023)  Financial Resource Strain: Unknown (06/13/2019)  Physical Activity: Unknown (06/13/2019)  Social Connections: Unknown (06/13/2019)  Stress: Unknown (06/13/2019)  Tobacco Use: Medium Risk (08/16/2023)    Readmission Risk Interventions    06/17/2023    4:23 PM 04/29/2022   10:26 AM 03/14/2022   12:48 PM  Readmission Risk Prevention Plan  Transportation Screening Complete Complete Complete  PCP or Specialist Appt within 3-5 Days   Complete  HRI or Home Care Consult Complete Complete Complete  Social Work Consult for Recovery Care Planning/Counseling Complete Complete Complete  Palliative Care Screening Not Applicable Not Applicable Not Applicable  Medication Review Oceanographer) Complete Complete Complete

## 2023-08-26 NOTE — Plan of Care (Signed)

## 2023-08-26 NOTE — Progress Notes (Signed)
PROGRESS NOTE    Douglas Peterson.  ZOX:[id number] DOB: 1951-02-23 DOA: 08/16/2023 PCP: Pcp, No    Brief Narrative:   72yo with h/o ETOH use d/o, chronic hyponatremia associated with beer potomania, PAF not on AC, TBI, and HTN who presented on 10/13 with ETOH intoxication and a fall.  He had acute on chronic hyponatremia.  He has had 6 ER visits and 8 admissions in the last 6 months.  Recommended for SNF - this would be optimal for him.   [name] declining SNF.  Still exhibiting signs of withdrawal.  Poor ability to swallow.  This was noted previously by SLP.  Currently recommending n.p.o.  Will need to reevaluate swallowing ability and GOC.  Patient has been unwilling or unable to participate in his own discussion.  Daughter not willing to make decisions for him and would prefer not to be contacted unless something disastrous occurs.  10/19: Transferred to ICU yesterday for increasing agitation and possible aspiration event.  Remained stable overnight.  No evidence of acute aspiration.  10/20: No longer exhibiting clinical signs of acute withdrawal however patient remains extremely weak and fatigued.  Evaluated by therapy.  Weakness seems to be worsening.  Unsafe to discharge home at this time.  10/21: Diet advanced after conversation regarding CODE STATUS.  Patient now DNR.  Seems to be tolerating.  Still very unsteady on the feet.  10/22: Patient has been somewhat agitated over the last 24 hours.  Has called the cops twice stating we are holding him hostage.  10/23: stable awaiting placement   Assessment & Plan:   Principal Problem:   Hyponatremia Active Problems:   Alcohol intoxication (HCC)   Elevated LFTs   Chronic diastolic CHF (congestive heart failure) (HCC)   Paroxysmal A-fib (HCC)   Alcoholic cirrhosis of liver without ascites (HCC)   Ground-level fall   Protein-calorie malnutrition, severe  Hyponatremia Acute on chronic hyponatremia in the setting of beer  potomania and poor nutrition/hydration Now resolved with fluids that have since been discontinued   Alcohol intoxication  Acute alcohol withdrawal Patient has a history of severe alcohol use disorder coming in with a ethanol level of 311 with signs of intoxication on examination Now s/p withdrawal with phenobarb taper Continuing vitamins, is on high-dose thiamine currently  Elevated LFTs In the setting of chronic daily alcohol use Minimally elevated at time of admission, now normalized   Chronic diastolic CHF (congestive heart failure) (HCC) Per chart review, patient has a history of diastolic heart failure with last EF of 60-65% in 2021.   Hypovolemic on presentation in the setting of alcohol use disorder, poor nutrition and dehydration   Paroxysmal A-fib (HCC) Regular rhythm on examination with normal ventricular rate Not on AC due to high risk for falls   Alcoholic cirrhosis of liver without ascites (HCC) Per chart review, patient has a history of alcohol induced cirrhosis however his platelets are normal, and INR was normal when checked 2 months ago and 1 year ago. Resume outpatient GI follow-up   Ground-level fall Secondary to acute intoxication and electrolyte derangements.   CT head, C-spine, chest and pelvis with no acute injuries. PT/OT evaluations Recommending SNF.   Patient now agreeable has been has chosen [facility] as top choice   Dyslipidemia Continue atorvastatin   Essential hypertension Continue home atenolol   BPH (benign prostatic hyperplasia) Continue Flomax   GOC  Recurrent hospitalizations and ER visits During his last hospitalization (10/3-8), he refused SNF placement and wanted to  dc home with [facility] He is again recommended for SNF placement He was agreeable to home place however he is now max assist and cannot get out of bed.  He is also unsafe to give oral intake given severe oropharyngeal dysphagia at baseline.  High risk for aspiration.  Very  difficult position.  At this point recommend further de-escalation of care and consideration for hospice. Patient not yet ready for hospice care.  Lengthy discussion with prior provider on 10/20 regarding his CODE STATUS who explained that we could not safely feed him because he was very high risk for aspiration and respiratory compromise.  He statedthat he wishes to eat with the knowledge that he may aspirate and is now DNR/DNI.  DVT prophylaxis: SQ Lovenox Code Status: Full Family Communication: None at bedside Disposition Plan: Status is: Inpatient Remains inpatient appropriate because: pending snf placement   Level of care: Med-Surg  Consultants:  Palliative care  Procedures:  None  Antimicrobials: None   Subjective: Seen and examined.  Calm, tolerating diet  Objective: Vitals:   08/25/23 2259 08/26/23 0835 08/26/23 1225 08/26/23 1522  BP: (!) 170/89 (!) 151/106 (!) 145/86 129/86  Pulse: 71 70 88 81  Resp: 18 16 16 16   Temp: 98.6 F (37 C) (!) 97.5 F (36.4 C) 97.9 F (36.6 C) 98.1 F (36.7 C)  TempSrc: Oral  Oral Oral  SpO2: 100% 100% 98% 98%  Weight:      Height:        Intake/Output Summary (Last 24 hours) at 08/26/2023 1550 Last data filed at 08/25/2023 2000 Gross per 24 hour  Intake 50 ml  Output --  Net 50 ml   Filed Weights   08/16/23 0848 08/18/23 0346 08/21/23 1915  Weight: 62 kg 57.4 kg 54.2 kg    Examination:  General exam: Frail agitated Respiratory system: Bibasilar crackles.  Normal work of breathing.    Cardiovascular system: S and S2, RRR, no murmurs, no pedal edema Gastrointestinal system: Thin, soft, NT/ND  Central nervous system: Alert.  Chronic right side contracture Extremities: Decreased power bilaterally Skin: No visible rashes, lesions or ulcers Psychiatry: appears a bit confused. Calm.    Data Reviewed: I have personally reviewed following labs and imaging studies  CBC: Recent Labs  Lab 08/22/23 0556 08/26/23 0339   WBC 6.3 6.9  NEUTROABS  --  2.5  HGB 14.9 14.1  HCT 43.4 41.5  MCV 91.6 90.6  PLT 209 220   Basic Metabolic Panel: Recent Labs  Lab 08/22/23 0556 08/26/23 0339  NA 139 138  K 3.7 3.5  CL 104 105  CO2 25 24  GLUCOSE 97 85  BUN 16 12  CREATININE 0.47* 0.54*  CALCIUM 9.3 9.0   GFR: Estimated Creatinine Clearance: 64 mL/min (A) (by C-G formula based on SCr of 0.54 mg/dL (L)). Liver Function Tests: Recent Labs  Lab 08/22/23 0556  AST 21  ALT 18  ALKPHOS 68  BILITOT 0.6  PROT 7.9  ALBUMIN 4.2   No results for input(s): "LIPASE", "AMYLASE" in the last 168 hours. Recent Labs  Lab 08/22/23 0556  AMMONIA 12   Coagulation Profile: No results for input(s): "INR", "PROTIME" in the last 168 hours. Cardiac Enzymes: No results for input(s): "CKTOTAL", "CKMB", "CKMBINDEX", "TROPONINI" in the last 168 hours. BNP (last 3 results) No results for input(s): "PROBNP" in the last 8760 hours. HbA1C: No results for input(s): "HGBA1C" in the last 72 hours. CBG: Recent Labs  Lab 08/22/23 1211 08/22/23 1547 08/22/23 1918  08/23/23 0332 08/23/23 2056  GLUCAP 93 100* 112* 112* 90   Lipid Profile: No results for input(s): "CHOL", "HDL", "LDLCALC", "TRIG", "CHOLHDL", "LDLDIRECT" in the last 72 hours. Thyroid Function Tests: No results for input(s): "TSH", "T4TOTAL", "FREET4", "T3FREE", "THYROIDAB" in the last 72 hours.  Anemia Panel: No results for input(s): "VITAMINB12", "FOLATE", "FERRITIN", "TIBC", "IRON", "RETICCTPCT" in the last 72 hours. Sepsis Labs: No results for input(s): "PROCALCITON", "LATICACIDVEN" in the last 168 hours.  Recent Results (from the past 240 hour(s))  MRSA Next Gen by PCR, Nasal     Status: None   Collection Time: 08/21/23  7:17 PM   Specimen: Nasal Mucosa; Nasal Swab  Result Value Ref Range Status   MRSA by PCR Next Gen NOT DETECTED NOT DETECTED Final    Comment: (NOTE) The GeneXpert MRSA Assay (FDA approved for NASAL specimens only), is one  component of a comprehensive MRSA colonization surveillance program. It is not intended to diagnose MRSA infection nor to guide or monitor treatment for MRSA infections. Test performance is not FDA approved in patients less than 87 years old. Performed at [facility], [address], [place], [place] [address]          Radiology Studies: No results found.      Scheduled Meds:  (feeding supplement) PROSource Plus  30 mL Oral BID BM   atenolol  12.5 mg Oral Daily   atorvastatin  10 mg Oral Daily   enoxaparin (LOVENOX) injection  40 mg Subcutaneous Q24H   feeding supplement  237 mL Oral TID BM   lidocaine  1 patch Transdermal Q24H   multivitamin with minerals  1 tablet Oral Daily   mouth rinse  15 mL Mouth Rinse 4 times per day   pantoprazole  40 mg Oral Daily   sodium chloride flush  3 mL Intravenous Q12H   tamsulosin  0.4 mg Oral QPC breakfast   Continuous Infusions:  thiamine (VITAMIN B1) injection 500 mg (08/26/23 1516)      LOS: 10 days     Silvano Bilis, MD [facility]   If 7PM-7AM, please contact night-coverage  08/26/2023, 3:50 PM

## 2023-08-26 NOTE — Plan of Care (Signed)
  Problem: Education: Goal: Knowledge of General Education information will improve Description Including pain rating scale, medication(s)/side effects and non-pharmacologic comfort measures Outcome: Progressing   

## 2023-08-26 NOTE — Progress Notes (Signed)
Occupational Therapy Treatment Patient Details Name: Douglas Peterson. MRN: [id number] DOB: 07-31-1951 Today's Date: 08/26/2023   History of present illness Douglas Peterson is a 72yoM who comes to [facility] ED on 10/13 after multiple falls while intoxicated, ETOH 311 on arrival. Pt showing bruising to bilat face posterior to bilat orbits, severe back pain with mobility, all imaging studies unremarkable. Pt followed by our services for several days, severely limited in mobility compared to typical potential, 10/18 was transferred to ICU with agitation and ?aspiration, then back to floor after stable.Marland KitchenPMH: ETOH abuse, AF, HTN, Rt spastic hemiplegia s/p remote TBI.   OT comments  Douglas Peterson was seen for OT/PT treatment on this date. Upon arrival to room pt reclined in bed, agreeable to tx. Pt requires CGA exit bed, use of momentum and rail. MOD A hand washing in standing. MAX A don/doff B socks in sitting. MIN A for ADL t/f ~30 ft + 80 ft. Pt making good progress toward goals, will continue to follow POC. Discharge recommendation remains appropriate.       If plan is discharge home, recommend the following:  A lot of help with walking and/or transfers;A lot of help with bathing/dressing/bathroom;Supervision due to cognitive status;Assistance with cooking/housework;Assist for transportation;Help with stairs or ramp for entrance   Equipment Recommendations  BSC/3in1    Recommendations for Other Services      Precautions / Restrictions Precautions Precautions: Fall Restrictions Weight Bearing Restrictions: No       Mobility Bed Mobility Overal bed mobility: Needs Assistance Bed Mobility: Supine to Sit     Supine to sit: Contact guard     General bed mobility comments: increased time and momentum    Transfers Overall transfer level: Needs assistance Equipment used: Straight cane Transfers: Sit to/from Stand Sit to Stand: Min assist                 Balance Overall balance  assessment: Needs assistance Sitting-balance support: Feet supported Sitting balance-Leahy Scale: Fair     Standing balance support: Single extremity supported, During functional activity Standing balance-Leahy Scale: Poor                             ADL either performed or assessed with clinical judgement   ADL Overall ADL's : Needs assistance/impaired                                       General ADL Comments: MOD A hand washing in standing. MAX A don/doff B socks in sitting. MIN A for ADL t/f ~30 ft      Cognition Arousal: Alert Behavior During Therapy: WFL for tasks assessed/performed Overall Cognitive Status: History of cognitive impairments - at baseline                                                General Comments educated on hand hygiene    Pertinent Vitals/ Pain       Pain Assessment Pain Assessment: Faces Faces Pain Scale: Hurts little more Pain Location: back Pain Descriptors / Indicators: Discomfort, Grimacing, Constant Pain Intervention(s): Limited activity within patient's tolerance   Frequency  Min 1X/week        Progress Toward Goals  OT Goals(current  goals can now be found in the care plan section)  Progress towards OT goals: Progressing toward goals  Acute Rehab OT Goals Patient Stated Goal: to go home OT Goal Formulation: With patient Time For Goal Achievement: 09/06/23 Potential to Achieve Goals: Fair ADL Goals Pt Will Perform Grooming: with set-up;sitting Pt Will Perform Lower Body Bathing: sit to/from stand;sitting/lateral leans;with min assist Pt Will Perform Lower Body Dressing: with min assist;sit to/from stand;sitting/lateral leans Pt Will Transfer to Toilet: bedside commode;stand pivot transfer;with min assist Pt Will Perform Toileting - Clothing Manipulation and hygiene: with min assist;sit to/from stand;sitting/lateral leans  Plan      Co-evaluation    PT/OT/SLP  Co-Evaluation/Treatment: Yes Reason for Co-Treatment: Complexity of the patient's impairments (multi-system involvement);For patient/therapist safety PT goals addressed during session: Mobility/safety with mobility;Balance OT goals addressed during session: ADL's and self-care      AM-PAC OT "6 Clicks" Daily Activity     Outcome Measure   Help from another person eating meals?: A Little Help from another person taking care of personal grooming?: A Lot Help from another person toileting, which includes using toliet, bedpan, or urinal?: A Lot Help from another person bathing (including washing, rinsing, drying)?: A Lot Help from another person to put on and taking off regular upper body clothing?: A Little Help from another person to put on and taking off regular lower body clothing?: A Lot 6 Click Score: 14    End of Session Equipment Utilized During Treatment: Gait belt  OT Visit Diagnosis: Unsteadiness on feet (R26.81);Muscle weakness (generalized) (M62.81);Other abnormalities of gait and mobility (R26.89)   Activity Tolerance Patient tolerated treatment well   Patient Left in chair;with call bell/phone within reach;with chair alarm set;with nursing/sitter in room   Nurse Communication          Time: 2202-5427 OT Time Calculation (min): 25 min  Charges: OT General Charges $OT Visit: 1 Visit OT Treatments $Self Care/Home Management : 8-22 mins  Kathie Dike, M.S. OTR/L  08/26/23, 3:11 PM  ascom [phone]

## 2023-08-26 NOTE — Progress Notes (Signed)
Mobility Specialist - Progress Note   08/26/23 1100  Mobility  Activity Ambulated with assistance in hallway  Level of Assistance Moderate assist, patient does 50-74%  Assistive Device Cane  Distance Ambulated (ft) 50 ft  Activity Response Tolerated well  $Mobility charge 1 Mobility     Pt lying in bed upon arrival, utilizing RA. Pt agreeable to activity. Completed bed mobility with minA for trunk support. STS and ambulation with maxA fading to modA + chair follow. Post lean in initial stance. Unsteady. No LOB. Distance limited by tolerance. Pt wheeled back to room and returned to bed with alarm set, needs in reach. Pt reporting mild back pain at end of session.    [name] Mobility Specialist 08/26/23, 11:28 AM

## 2023-08-26 NOTE — Progress Notes (Signed)
Nutrition Follow-up  DOCUMENTATION CODES:   Severe malnutrition in context of chronic illness  INTERVENTION:   -Continue MVI with minerals daily -Continue Ensure Enlive po TID, each supplement provides 350 kcal and 20 grams of protein.  -Continue regular diet -30 ml Prosource Plus BID, each supplement provides 100 kcals and 15 grams protein -Magic cup TID with meals, each supplement provides 290 kcal and 9 grams of protein   NUTRITION DIAGNOSIS:   Severe Malnutrition related to chronic illness (ETOH abuse) as evidenced by moderate fat depletion, severe fat depletion, moderate muscle depletion, severe muscle depletion.  Ongoing  GOAL:   Patient will meet greater than or equal to 90% of their needs  Progressing  MONITOR:   PO intake, Supplement acceptance  REASON FOR ASSESSMENT:   Malnutrition Screening Tool    ASSESSMENT:   Pt with h/o ETOH use d/o, chronic hyponatremia associated with beer potomania, PAF not on AC, TBI, and HTN who presented with ETOH intoxication and a fall.  10/19- transferred to ICU secondary to signs of alcohol withdrawal   Reviewed I/O's: +50 ml x 24 hours and +953 ml since admission   Withdrawal symptoms have improved, however, pt with weakness and agitation.   Pt with variable acceptance of oral intake and supplements. Noted meal completions 0-100%.   Per TOC notes, plan for SNF placement at discharge.   Medications reviewed and include thiamine.   Labs reviewed: CBGS: 90-112 (inpatient orders for glycemic control are none).    Diet Order:   Diet Order             DIET SOFT Fluid consistency: Thin  Diet effective now                   EDUCATION NEEDS:   Not appropriate for education at this time  Skin:  Skin Assessment: Reviewed RN Assessment  Last BM:  08/24/23  Height:   Ht Readings from Last 1 Encounters:  08/21/23 5\' 6"  (1.676 m)    Weight:   Wt Readings from Last 1 Encounters:  08/21/23 54.2 kg    Ideal  Body Weight:  64.5 kg  BMI:  Body mass index is 19.29 kg/m.  Estimated Nutritional Needs:   Kcal:  1750-1950  Protein:  90-105 grams  Fluid:  > 1.7 L    Levada Schilling, RD, LDN, CDCES Registered Dietitian III Certified Diabetes Care and Education Specialist Please refer to [facility] for RD and/or RD on-call/weekend/after hours pager

## 2023-08-27 DIAGNOSIS — E871 Hypo-osmolality and hyponatremia: Secondary | ICD-10-CM | POA: Diagnosis not present

## 2023-08-27 LAB — TROPONIN I (HIGH SENSITIVITY): Troponin I (High Sensitivity): 3 ng/L (ref <18)

## 2023-08-27 LAB — LIPASE, BLOOD: Lipase: 79 U/L — ABNORMAL HIGH (ref 11–51)

## 2023-08-27 MED ORDER — PANTOPRAZOLE SODIUM 40 MG PO TBEC
40.0000 mg | DELAYED_RELEASE_TABLET | Freq: Two times a day (BID) | ORAL | Status: DC
  Administered 2023-08-27 – 2023-08-28 (×2): 40 mg via ORAL
  Filled 2023-08-27 (×2): qty 1

## 2023-08-27 NOTE — TOC Progression Note (Signed)
Transition of Care [facility]) - CM/SW Discharge Note   Patient Details  Name: Douglas Peterson. MRN: [id number] Date of Birth: 11-02-1951  Transition of Care [facility] CM/SW Contact:  Truddie Hidden, RN Phone Number: 08/27/2023, 10:47 AM   Clinical Narrative:    [facility] pending per Tammy from [facility].       Barriers to Discharge: Continued Medical Work up   Patient Goals and CMS Choice CMS Medicare.gov Compare Post Acute Care list provided to:: Patient Choice offered to / list presented to : Patient  Discharge Placement                         Discharge Plan and Services Additional resources added to the After Visit Summary for                                       Social Determinants of Health (SDOH) Interventions SDOH Screenings   Food Insecurity: Food Insecurity Present (08/19/2023)  Housing: High Risk (08/19/2023)  Transportation Needs: Unmet Transportation Needs (08/19/2023)  Utilities: At Risk (08/19/2023)  Financial Resource Strain: Unknown (06/13/2019)  Physical Activity: Unknown (06/13/2019)  Social Connections: Unknown (06/13/2019)  Stress: Unknown (06/13/2019)  Tobacco Use: Medium Risk (08/16/2023)     Readmission Risk Interventions    06/17/2023    4:23 PM 04/29/2022   10:26 AM 03/14/2022   12:48 PM  Readmission Risk Prevention Plan  Transportation Screening Complete Complete Complete  PCP or Specialist Appt within 3-5 Days   Complete  HRI or Home Care Consult Complete Complete Complete  Social Work Consult for Recovery Care Planning/Counseling Complete Complete Complete  Palliative Care Screening Not Applicable Not Applicable Not Applicable  Medication Review Oceanographer) Complete Complete Complete

## 2023-08-27 NOTE — Plan of Care (Signed)
  Problem: Education: Goal: Knowledge of General Education information will improve Description Including pain rating scale, medication(s)/side effects and non-pharmacologic comfort measures Outcome: Progressing   

## 2023-08-27 NOTE — Progress Notes (Signed)
PROGRESS NOTE    Douglas Peterson.  VHQ:[id number] DOB: Jul 21, 1951 DOA: 08/16/2023 PCP: Pcp, No    Brief Narrative:   72yo with h/o ETOH use d/o, chronic hyponatremia associated with beer potomania, PAF not on AC, TBI, and HTN who presented on 10/13 with ETOH intoxication and a fall.  He had acute on chronic hyponatremia.  He has had 6 ER visits and 8 admissions in the last 6 months.  Recommended for SNF - this would be optimal for him.   [name] declining SNF.  Still exhibiting signs of withdrawal.  Poor ability to swallow.  This was noted previously by SLP.  Currently recommending n.p.o.  Will need to reevaluate swallowing ability and GOC.  Patient has been unwilling or unable to participate in his own discussion.  Daughter not willing to make decisions for him and would prefer not to be contacted unless something disastrous occurs.  10/19: Transferred to ICU yesterday for increasing agitation and possible aspiration event.  Remained stable overnight.  No evidence of acute aspiration.  10/20: No longer exhibiting clinical signs of acute withdrawal however patient remains extremely weak and fatigued.  Evaluated by therapy.  Weakness seems to be worsening.  Unsafe to discharge home at this time.  10/21: Diet advanced after conversation regarding CODE STATUS.  Patient now DNR.  Seems to be tolerating.  Still very unsteady on the feet.  10/22: Patient has been somewhat agitated over the last 24 hours.  Has called the cops twice stating we are holding him hostage.  10/23: stable awaiting placement   Assessment & Plan:   Principal Problem:   Hyponatremia Active Problems:   Alcohol intoxication (HCC)   Elevated LFTs   Chronic diastolic CHF (congestive heart failure) (HCC)   Paroxysmal A-fib (HCC)   Alcoholic cirrhosis of liver without ascites (HCC)   Ground-level fall   Protein-calorie malnutrition, severe  Hyponatremia Acute on chronic hyponatremia in the setting of beer  potomania and poor nutrition/hydration Now resolved with fluids that have since been discontinued   Alcohol intoxication  Acute alcohol withdrawal Patient has a history of severe alcohol use disorder coming in with a ethanol level of 311 with signs of intoxication on examination Now s/p withdrawal with phenobarb taper Continuing vitamins, is on high-dose thiamine currently  Substernal pain Reports epigastric/substernal pain radiating to back similar to priors, w/u in August with ct of chest/abdomen/pelvis nothing acute, suspect gastritis which is chronic. No dyspnea or hypoxia to suggest acute pulmonary process - will check lipase, troponin - increase protonix to bid  Elevated LFTs In the setting of chronic daily alcohol use Minimally elevated at time of admission, now normalized   Chronic diastolic CHF (congestive heart failure) (HCC) Per chart review, patient has a history of diastolic heart failure with last EF of 60-65% in 2021.   Hypovolemic on presentation in the setting of alcohol use disorder, poor nutrition and dehydration   Paroxysmal A-fib (HCC) Regular rhythm on examination with normal ventricular rate Not on AC due to high risk for falls   Alcoholic cirrhosis of liver without ascites (HCC) Per chart review, patient has a history of alcohol induced cirrhosis however his platelets are normal, and INR was normal when checked 2 months ago and 1 year ago. Resume outpatient GI follow-up   Ground-level fall Secondary to acute intoxication and electrolyte derangements.   CT head, C-spine, chest and pelvis with no acute injuries. PT/OT evaluations Recommending SNF.   Patient now agreeable has been has chosen [facility] as top choice Awaiting authorization   Dyslipidemia Continue atorvastatin   Essential hypertension Continue home atenolol   BPH (benign prostatic hyperplasia) Continue Flomax   GOC  Recurrent hospitalizations and ER visits During his last  hospitalization (10/3-8), he refused SNF placement and wanted to dc [facility] with [facility] He is again recommended for SNF placement He was agreeable to home place however he is now max assist and cannot get out of bed.  He is also unsafe to give oral intake given severe oropharyngeal dysphagia at baseline.  High risk for aspiration.  Very difficult position.  At this point recommend further de-escalation of care and consideration for hospice. Patient not yet ready for hospice care.  Lengthy discussion with prior provider on 10/20 regarding his CODE STATUS who explained that we could not safely feed him because he was very high risk for aspiration and respiratory compromise.  He statedthat he wishes to eat with the knowledge that he may aspirate and is now DNR/DNI.  DVT prophylaxis: SQ Lovenox Code Status: Full Family Communication: None at bedside Disposition Plan: Status is: Inpatient Remains inpatient appropriate because: pending snf placement   Level of care: Med-Surg  Consultants:  Palliative care  Procedures:  None  Antimicrobials: None   Subjective: Seen and examined.  Calm, tolerating diet  Objective: Vitals:   08/26/23 2341 08/27/23 0425 08/27/23 0812 08/27/23 0813  BP: 110/71 113/81  133/88  Pulse: 70 (!) 54 (!) 52 (!) 58  Resp: 18 18  16   Temp: 98 F (36.7 C) 98.2 F (36.8 C)  (!) 97.5 F (36.4 C)  TempSrc:    Oral  SpO2: 95% 97%  99%  Weight:      Height:        Intake/Output Summary (Last 24 hours) at 08/27/2023 1442 Last data filed at 08/27/2023 1025 Gross per 24 hour  Intake 200 ml  Output 302 ml  Net -102 ml   Filed Weights   08/16/23 0848 08/18/23 0346 08/21/23 1915  Weight: 62 kg 57.4 kg 54.2 kg    Examination:  General exam: Frail agitated Respiratory system: Bibasilar crackles.  Normal work of breathing.    Cardiovascular system: S and S2, RRR, no murmurs, no pedal edema Gastrointestinal system: Thin, soft, NT/ND  Central nervous system: Alert.   Chronic right side contracture Extremities: Decreased power bilaterally Skin: No visible rashes, lesions or ulcers Psychiatry: appears a bit confused. Calm.    Data Reviewed: I have personally reviewed following labs and imaging studies  CBC: Recent Labs  Lab 08/22/23 0556 08/26/23 0339  WBC 6.3 6.9  NEUTROABS  --  2.5  HGB 14.9 14.1  HCT 43.4 41.5  MCV 91.6 90.6  PLT 209 220   Basic Metabolic Panel: Recent Labs  Lab 08/22/23 0556 08/26/23 0339  NA 139 138  K 3.7 3.5  CL 104 105  CO2 25 24  GLUCOSE 97 85  BUN 16 12  CREATININE 0.47* 0.54*  CALCIUM 9.3 9.0   GFR: Estimated Creatinine Clearance: 64 mL/min (A) (by C-G formula based on SCr of 0.54 mg/dL (L)). Liver Function Tests: Recent Labs  Lab 08/22/23 0556  AST 21  ALT 18  ALKPHOS 68  BILITOT 0.6  PROT 7.9  ALBUMIN 4.2   No results for input(s): "LIPASE", "AMYLASE" in the last 168 hours. Recent Labs  Lab 08/22/23 0556  AMMONIA 12   Coagulation Profile: No results for input(s): "INR", "PROTIME" in the last 168 hours. Cardiac Enzymes: No results for input(s): "  CKTOTAL", "CKMB", "CKMBINDEX", "TROPONINI" in the last 168 hours. BNP (last 3 results) No results for input(s): "PROBNP" in the last 8760 hours. HbA1C: No results for input(s): "HGBA1C" in the last 72 hours. CBG: Recent Labs  Lab 08/22/23 1211 08/22/23 1547 08/22/23 1918 08/23/23 0332 08/23/23 2056  GLUCAP 93 100* 112* 112* 90   Lipid Profile: No results for input(s): "CHOL", "HDL", "LDLCALC", "TRIG", "CHOLHDL", "LDLDIRECT" in the last 72 hours. Thyroid Function Tests: No results for input(s): "TSH", "T4TOTAL", "FREET4", "T3FREE", "THYROIDAB" in the last 72 hours.  Anemia Panel: No results for input(s): "VITAMINB12", "FOLATE", "FERRITIN", "TIBC", "IRON", "RETICCTPCT" in the last 72 hours. Sepsis Labs: No results for input(s): "PROCALCITON", "LATICACIDVEN" in the last 168 hours.  Recent Results (from the past 240 hour(s))  MRSA  Next Gen by PCR, Nasal     Status: None   Collection Time: 08/21/23  7:17 PM   Specimen: Nasal Mucosa; Nasal Swab  Result Value Ref Range Status   MRSA by PCR Next Gen NOT DETECTED NOT DETECTED Final    Comment: (NOTE) The GeneXpert MRSA Assay (FDA approved for NASAL specimens only), is one component of a comprehensive MRSA colonization surveillance program. It is not intended to diagnose MRSA infection nor to guide or monitor treatment for MRSA infections. Test performance is not FDA approved in patients less than 50 years old. Performed at [facility], [address], [place], [place] [address]          Radiology Studies: No results found.      Scheduled Meds:  (feeding supplement) PROSource Plus  30 mL Oral BID BM   atenolol  12.5 mg Oral Daily   atorvastatin  10 mg Oral Daily   enoxaparin (LOVENOX) injection  40 mg Subcutaneous Q24H   feeding supplement  237 mL Oral TID BM   lidocaine  1 patch Transdermal Q24H   multivitamin with minerals  1 tablet Oral Daily   pantoprazole  40 mg Oral Daily   tamsulosin  0.4 mg Oral QPC breakfast   Continuous Infusions:  thiamine (VITAMIN B1) injection 500 mg (08/27/23 5366)      LOS: 11 days     Silvano Bilis, MD [facility]   If 7PM-7AM, please contact night-coverage  08/27/2023, 2:42 PM

## 2023-08-28 ENCOUNTER — Inpatient Hospital Stay: Payer: Medicare HMO

## 2023-08-28 DIAGNOSIS — E871 Hypo-osmolality and hyponatremia: Secondary | ICD-10-CM | POA: Diagnosis not present

## 2023-08-28 MED ORDER — FOLIC ACID 1 MG PO TABS: 1.0000 mg | ORAL_TABLET | Freq: Every day | ORAL | Status: DC

## 2023-08-28 MED ORDER — THIAMINE MONONITRATE 100 MG PO TABS
100.0000 mg | ORAL_TABLET | Freq: Every day | ORAL | Status: DC
  Administered 2023-08-28: 100 mg via ORAL
  Filled 2023-08-28: qty 1

## 2023-08-28 MED ORDER — IOHEXOL 350 MG/ML SOLN
75.0000 mL | Freq: Once | INTRAVENOUS | Status: AC | PRN
  Administered 2023-08-28: 75 mL via INTRAVENOUS

## 2023-08-28 MED ORDER — TAMSULOSIN HCL 0.4 MG PO CAPS: 0.4000 mg | ORAL_CAPSULE | Freq: Every day | ORAL | Status: AC

## 2023-08-28 NOTE — Care Management Important Message (Signed)
Important Message  Patient Details  Name: Douglas Peterson. MRN: [id number] Date of Birth: 05-02-1951   Important Message Given:  Yes - Medicare IM     Truddie Hidden, RN 08/28/2023, 3:18 PM

## 2023-08-28 NOTE — TOC Transition Note (Signed)
Transition of Care [facility]) - CM/SW Discharge Note   Patient Details  Name: Douglas Peterson. MRN: [id number] Date of Birth: [birth year]  Transition of [facility]) CM/SW Contact:  Truddie Hidden, RN Phone Number: 08/28/2023, 2:58 PM   Clinical Narrative:    Spoke with Tammy  in admissions at [facility]  Per facility patient admission confirmed for today. Patient assigned room # 603-B Nurse will call report to [phone] Face sheet and medical necessity forms printed to the floor to be added to the EMS pack EMS arranged "He's number three."  Discharge summary and SNF transfer report sent in HUB.  Nurse, and patient notified. TOC signing off.    Final next level of care: Skilled Nursing Facility Barriers to Discharge: Barriers Resolved   Patient Goals and CMS Choice CMS Medicare.gov Compare Post Acute Care list provided to:: Patient Choice offered to / list presented to : Patient  Discharge Placement                Patient chooses bed at: [facility] [place] Patient to be transferred to facility by: [facility]   Patient and family notified of of transfer: 08/28/23  Discharge Plan and Services Additional resources added to the After Visit Summary for                                       Social Determinants of Health (SDOH) Interventions SDOH Screenings   Food Insecurity: Food Insecurity Present (08/19/2023)  Housing: High Risk (08/19/2023)  Transportation Needs: Unmet Transportation Needs (08/19/2023)  Utilities: At Risk (08/19/2023)  Financial Resource Strain: Unknown (06/13/2019)  Physical Activity: Unknown (06/13/2019)  Social Connections: Unknown (06/13/2019)  Stress: Unknown (06/13/2019)  Tobacco Use: Medium Risk (08/16/2023)     Readmission Risk Interventions    06/17/2023    4:23 PM 04/29/2022   10:26 AM 03/14/2022   12:48 PM  Readmission Risk Prevention Plan  Transportation Screening Complete Complete Complete  PCP or Specialist Appt  within 3-5 Days   Complete  HRI or Home Care Consult Complete Complete Complete  Social Work Consult for Recovery Care Planning/Counseling Complete Complete Complete  Palliative Care Screening Not Applicable Not Applicable Not Applicable  Medication Review Oceanographer) Complete Complete Complete

## 2023-08-28 NOTE — Discharge Summary (Signed)
Douglas Peterson. ZOX:[id number] DOB: June 05, 1951 DOA: 08/16/2023  PCP: Pcp, No  Admit date: 08/16/2023 Discharge date: 08/28/2023  Time spent: 35 minutes  Recommendations for Outpatient Follow-up:  Pcp f/u and substance abuse treatment     Discharge Diagnoses:  Principal Problem:   Hyponatremia Active Problems:   Alcohol intoxication (HCC)   Elevated LFTs   Chronic diastolic CHF (congestive heart failure) (HCC)   Paroxysmal A-fib (HCC)   Alcoholic cirrhosis of liver without ascites (HCC)   Ground-level fall   Protein-calorie malnutrition, severe   Discharge Condition: stable  Diet recommendation: heart healthy  Filed Weights   08/16/23 0848 08/18/23 0346 08/21/23 1915  Weight: 62 kg 57.4 kg 54.2 kg    History of present illness:  From admission h and p Douglas Peterson. is a 72 y.o. male with medical history significant of alcohol use disorder with recurrent hyponatremia secondary to beer [place] mania, paroxysmal atrial fibrillation not on AC, traumatic brain injury, hypertension, who presents to the ED due to a fall and alcohol intoxication.   History limited by patient's intoxication. Douglas Peterson states he typically drinks "12 18s" of beer everyday, including today. Earlier today, he was trying to stand up and felt dizzy. He fell backwards and hit his upper back on the left side, where he is currently experiencing pain. He denies hitting his head or LOC. He denies any other complaints at this time.   Hospital Course:  Hyponatremia Acute on chronic hyponatremia in the setting of beer potomania and poor nutrition/hydration Now resolved with fluids that have since been discontinued   Alcohol intoxication  Acute alcohol withdrawal Patient has a history of severe alcohol use disorder coming in with a ethanol level of 311 with signs of intoxication on examination Now s/p withdrawal with phenobarb taper, stable Continuing vitamins, received high-dose thiamine this  hospitalization   Substernal pain Reports  substernal pain radiating to back. He has complained of at least what appears to be a similar pain in prior admissions but he says this is different. Troponin negative, lipase mildly elevated but no abdominal tenderness so doubt pancreatitis. No dyspnea or hypoxia to suggest acute pulmonary process. CTA obtained to r/o aortic dissection and other acute process, negative. Suspect esophagitis from etoh abuse, continue ppi   Elevated LFTs In the setting of chronic daily alcohol use Minimally elevated at time of admission, now normalized   Chronic diastolic CHF (congestive heart failure) (HCC) Per chart review, patient has a history of diastolic heart failure with last EF of 60-65% in 2021.   Hypovolemic on presentation in the setting of alcohol use disorder, poor nutrition and dehydration   Paroxysmal A-fib (HCC) Regular rhythm on examination with normal ventricular rate Not on AC due to high risk for falls   Alcoholic cirrhosis of liver without ascites (HCC) Per chart review, patient has a history of alcohol induced cirrhosis however his platelets are normal, and INR was normal when checked 2 months ago and 1 year ago. Resume outpatient GI follow-up   Ground-level fall Secondary to acute intoxication and electrolyte derangements.   CT head, C-spine, chest and pelvis with no acute injuries. PT/OT evaluations Recommending SNF.   Patient now agreeable has been has chosen [facility] as top choice   History TBI With chronic right side contractures/weakness    Procedures: none   Consultations: none  Discharge Exam: Vitals:   08/28/23 0800 08/28/23 1200  BP: 136/82 118/73  Pulse: (!) 56 (!) 53  Resp: 16 16  Temp: 98 F (36.7 C) 98.6 F (37 C)  SpO2: 98% 94%    General exam: Frail, calm, NAD Respiratory system: Bibasilar crackles, otherwise clear.  Normal work of breathing.    Cardiovascular system: S and S2, RRR, no murmurs, no  pedal edema Gastrointestinal system: Thin, soft, NT/ND  Central nervous system: Alert.  Chronic right side contracture Extremities: Decreased power bilaterally. Right arm contractures Skin: No visible rashes, lesions or ulcers Psychiatry: calm  Discharge Instructions   Discharge Instructions     Diet - low sodium heart healthy   Complete by: As directed    Increase activity slowly   Complete by: As directed    No wound care   Complete by: As directed       Allergies as of 08/28/2023       Reactions   Hydrochlorothiazide Other (See Comments)   Hyponatremia        Medication List     TAKE these medications    acetaminophen 500 MG tablet Commonly known as: TYLENOL Take 2 tablets (1,000 mg total) by mouth every 8 (eight) hours as needed for mild pain or moderate pain.   atenolol 25 MG tablet Commonly known as: TENORMIN Take 0.5 tablets (12.5 mg total) by mouth daily. What changed: how much to take   atorvastatin 10 MG tablet Commonly known as: LIPITOR Take 1 tablet (10 mg total) by mouth daily.   Baclofen 5 MG Tabs Take 1 tablet (5 mg total) by mouth 3 (three) times daily.   feeding supplement Liqd Take 237 mLs by mouth 2 (two) times daily between meals.   folic acid 1 MG tablet Commonly known as: FOLVITE Take 1 tablet (1 mg total) by mouth daily.   melatonin 5 MG Tabs Take 1 tablet (5 mg total) by mouth at bedtime.   multivitamin with minerals Tabs tablet Take 1 tablet by mouth daily.   pantoprazole 40 MG tablet Commonly known as: PROTONIX Take 1 tablet (40 mg total) by mouth daily.   polyethylene glycol 17 g packet Commonly known as: MIRALAX / GLYCOLAX Take 17 g by mouth daily as needed. Mix one tablespoon with 8oz of your favorite juice or water every day until you are having soft formed stools. Then start taking once daily if you didn't have a stool the day before.   sucralfate 1 g tablet Commonly known as: CARAFATE Take 1 tablet (1 g total)  by mouth 4 (four) times daily -  with meals and at bedtime.   tamsulosin 0.4 MG Caps capsule Commonly known as: FLOMAX Take 1 capsule (0.4 mg total) by mouth daily.   thiamine 100 MG tablet Commonly known as: VITAMIN B1 Take 1 tablet (100 mg total) by mouth daily.       Allergies  Allergen Reactions   Hydrochlorothiazide Other (See Comments)    Hyponatremia      The results of significant diagnostics from this hospitalization (including imaging, microbiology, ancillary and laboratory) are listed below for reference.    Significant Diagnostic Studies: DG Chest Port 1 View  Result Date: 08/21/2023 CLINICAL DATA:  Cough.  Aspiration. EXAM: PORTABLE CHEST 1 VIEW COMPARISON:  08/19/2023. FINDINGS: Low lung volume. There are probable atelectatic changes at the lung bases. Bilateral lung fields are otherwise clear. No acute consolidation or lung collapse. Bilateral costophrenic angles are clear. Stable cardio-mediastinal silhouette. No acute osseous abnormalities. Bilateral old healed rib fractures and bilateral clavicular fractures noted. The soft tissues are within normal limits. IMPRESSION: 1. Low lung  volume. Bibasilar atelectasis. Electronically Signed   By: Jules Schick M.D.   On: 08/21/2023 18:59   DG Chest Port 1 View  Result Date: 08/19/2023 CLINICAL DATA:  Cough. EXAM: PORTABLE CHEST 1 VIEW COMPARISON:  Chest radiograph 08/16/2023 FINDINGS: The cardiomediastinal silhouette is unchanged with normal heart size. Lung volumes remain low with similar appearance of mild bibasilar opacities. The upper lungs are clear. No overt pulmonary edema, sizable pleural effusion, or pneumothorax is identified. Old rib and left clavicle fractures are noted. IMPRESSION: Low lung volumes with bibasilar atelectasis. Electronically Signed   By: Sebastian Ache M.D.   On: 08/19/2023 16:17   DG Chest Portable 1 View  Result Date: 08/16/2023 CLINICAL DATA:  Fall with pain. EXAM: PORTABLE CHEST 1 VIEW  COMPARISON:  Chest radiograph dated 08/05/2023. FINDINGS: The heart size and mediastinal contours are within normal limits. There is mild bibasilar atelectasis. No significant pleural effusion or pneumothorax. The visualized skeletal structures are unremarkable. IMPRESSION: Mild bibasilar atelectasis. Electronically Signed   By: Romona Curls M.D.   On: 08/16/2023 10:19   DG Pelvis 1-2 Views  Result Date: 08/16/2023 CLINICAL DATA:  Fall with pain. EXAM: PELVIS - 1-2 VIEW COMPARISON:  Abdominal radiographs dated 06/03/2023. FINDINGS: There is no evidence of pelvic fracture or diastasis. Degenerative changes are seen in both hips and the spine. IMPRESSION: No acute osseous injury. Electronically Signed   By: Romona Curls M.D.   On: 08/16/2023 10:17   CT HEAD WO CONTRAST ( )  Result Date: 08/16/2023 CLINICAL DATA:  Trauma. EXAM: CT HEAD WITHOUT CONTRAST CT CERVICAL SPINE WITHOUT CONTRAST TECHNIQUE: Multidetector CT imaging of the head and cervical spine was performed following the standard protocol without intravenous contrast. Multiplanar CT image reconstructions of the cervical spine were also generated. RADIATION DOSE REDUCTION: This exam was performed according to the departmental dose-optimization program which includes automated exposure control, adjustment of the mA and/or kV according to patient size and/or use of iterative reconstruction technique. COMPARISON:  08/04/2023. FINDINGS: CT HEAD FINDINGS Brain: There is periventricular white matter decreased attenuation consistent with small vessel ischemic changes. Ventricles, sulci and cisterns are prominent consistent with age related involutional changes. No acute intracranial hemorrhage, mass effect or shift. No hydrocephalus. Vascular: No hyperdense vessel or unexpected calcification. Skull: Normal. Negative for fracture or focal lesion. Sinuses/Orbits: No acute finding. CT CERVICAL SPINE FINDINGS Alignment: Normal. Skull base and vertebrae: No  acute fracture. No primary bone lesion or focal pathologic process. Soft tissues and spinal canal: No prevertebral fluid or swelling. No visible canal hematoma. Disc levels: Disc space narrowing C4-5 through C6-7 with marginal osteophytes and endplate sclerosis. 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 Facet joint degenerative changes bilaterally C3-4 through C6-7. Upper chest: Negative. IMPRESSION: 1. No acute intracranial process. 2. Chronic small-vessel ischemia periventricular white matter and age-related involutional changes. 3. Cervical degenerative changes. 4. No acute traumatic abnormalities. Electronically Signed   By: Layla Maw M.D.   On: 08/16/2023 09:56   CT Cervical Spine Wo Contrast  Result Date: 08/16/2023 CLINICAL DATA:  Trauma. EXAM: CT HEAD WITHOUT CONTRAST CT CERVICAL SPINE WITHOUT CONTRAST TECHNIQUE: Multidetector CT imaging of the head and cervical spine was performed following the standard protocol without intravenous contrast. Multiplanar CT image reconstructions of the cervical spine were also generated. RADIATION DOSE REDUCTION: This exam was performed according to the departmental dose-optimization program which includes automated exposure control, adjustment of the mA and/or kV according to patient size and/or use of iterative reconstruction technique. COMPARISON:  08/04/2023. FINDINGS: CT HEAD FINDINGS  Brain: There is periventricular white matter decreased attenuation consistent with small vessel ischemic changes. Ventricles, sulci and cisterns are prominent consistent with age related involutional changes. No acute intracranial hemorrhage, mass effect or shift. No hydrocephalus. Vascular: No hyperdense vessel or unexpected calcification. Skull: Normal. Negative for fracture or focal lesion. Sinuses/Orbits: No acute finding. CT CERVICAL SPINE FINDINGS Alignment: Normal. Skull base and vertebrae: No acute fracture. No primary bone lesion or focal pathologic process. Soft tissues and spinal  canal: No prevertebral fluid or swelling. No visible canal hematoma. Disc levels: Disc space narrowing C4-5 through C6-7 with marginal osteophytes and endplate sclerosis. 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 Facet joint degenerative changes bilaterally C3-4 through C6-7. Upper chest: Negative. IMPRESSION: 1. No acute intracranial process. 2. Chronic small-vessel ischemia periventricular white matter and age-related involutional changes. 3. Cervical degenerative changes. 4. No acute traumatic abnormalities. Electronically Signed   By: Layla Maw M.D.   On: 08/16/2023 09:56   DG Chest Port 1 View  Result Date: 08/05/2023 CLINICAL DATA:  Cough EXAM: PORTABLE CHEST 1 VIEW COMPARISON:  08/04/2023 FINDINGS: Low lung volumes. Right base atelectasis. Left lung clear. Heart and mediastinal contours within normal limits. No effusions or acute bony abnormality. IMPRESSION: Low lung volumes, right base atelectasis. Electronically Signed   By: Charlett Nose M.D.   On: 08/05/2023 01:09   CT HEAD WO CONTRAST ( )  Result Date: 08/04/2023 CLINICAL DATA:  Mental status changes, unknown cause. EXAM: CT HEAD WITHOUT CONTRAST TECHNIQUE: Contiguous axial images were obtained from the base of the skull through the vertex without intravenous contrast. RADIATION DOSE REDUCTION: This exam was performed according to the departmental dose-optimization program which includes automated exposure control, adjustment of the mA and/or kV according to patient size and/or use of iterative reconstruction technique. COMPARISON:  Recent head CT 07/18/2023, prior head CT 03/17/2022. FINDINGS: Brain: There is mild global atrophy, mild small-vessel disease of the cerebral white matter, with mild-to-moderate atrophic ventriculomegaly versus normal pressure hydrocephalus. Normal variant mega cisterna magna is again noted. Dystrophic calcifications in the mid to anterior falx are again shown. There is no midline shift. There is no asymmetry concerning for  an acute cortical based infarct, hemorrhage, mass or mass effect. The basal cisterns are clear. Vascular: There are patchy calcifications in both siphons. There are no hyperdense central vessels. Skull: Negative for fractures or focal lesions. Sinuses/Orbits: Negative orbits. Clear paranasal sinuses and mastoids. Reverse S shaped nasal septum with right-sided spurring. Other: None. IMPRESSION: 1. No acute intracranial CT findings or interval changes. 2. Atrophy and small-vessel disease. 3. Mild-to-moderate atrophic ventriculomegaly versus normal pressure hydrocephalus. Electronically Signed   By: Almira Bar M.D.   On: 08/04/2023 01:44   DG Chest Portable 1 View  Result Date: 08/04/2023 CLINICAL DATA:  Shortness of breath and altered mental status. EXAM: PORTABLE CHEST 1 VIEW COMPARISON:  Portable chest 07/23/2023 FINDINGS: The lungs are expiratory. The lower lung fields are not well seen. The visualized lungs are clear. The sulci are sharp. There is mild cardiomegaly. No findings of CHF. The mediastinum is normally outlined. Osteopenia. Chronic overriding healed fracture deformity midshaft left clavicle. Chronic healed fracture deformities of multiple posterior ribs. There are overlying monitor wires. IMPRESSION: Expiratory chest x-ray with mild cardiomegaly. No acute radiographic chest findings, but the lower lung fields are not well seen. Electronically Signed   By: Almira Bar M.D.   On: 08/04/2023 01:36    Microbiology: Recent Results (from the past 240 hour(s))  MRSA Next Gen by PCR, Nasal  Status: None   Collection Time: 08/21/23  7:17 PM   Specimen: Nasal Mucosa; Nasal Swab  Result Value Ref Range Status   MRSA by PCR Next Gen NOT DETECTED NOT DETECTED Final    Comment: (NOTE) The GeneXpert MRSA Assay (FDA approved for NASAL specimens only), is one component of a comprehensive MRSA colonization surveillance program. It is not intended to diagnose MRSA infection nor to guide or  monitor treatment for MRSA infections. Test performance is not FDA approved in patients less than 60 years old. Performed at [facility], [address] [place], [place] [address]      Labs: Basic Metabolic Panel: Recent Labs  Lab 08/22/23 0556 08/26/23 0339  NA 139 138  K 3.7 3.5  CL 104 105  CO2 25 24  GLUCOSE 97 85  BUN 16 12  CREATININE 0.47* 0.54*  CALCIUM 9.3 9.0   Liver Function Tests: Recent Labs  Lab 08/22/23 0556  AST 21  ALT 18  ALKPHOS 68  BILITOT 0.6  PROT 7.9  ALBUMIN 4.2   Recent Labs  Lab 08/27/23 1509  LIPASE 79*   Recent Labs  Lab 08/22/23 0556  AMMONIA 12   CBC: Recent Labs  Lab 08/22/23 0556 08/26/23 0339  WBC 6.3 6.9  NEUTROABS  --  2.5  HGB 14.9 14.1  HCT 43.4 41.5  MCV 91.6 90.6  PLT 209 220   Cardiac Enzymes: No results for input(s): "CKTOTAL", "CKMB", "CKMBINDEX", "TROPONINI" in the last 168 hours. BNP: BNP (last 3 results) Recent Labs    06/16/23 1410 08/04/23 0229  BNP 6.8 22.0    ProBNP (last 3 results) No results for input(s): "PROBNP" in the last 8760 hours.  CBG: Recent Labs  Lab 08/22/23 1211 08/22/23 1547 08/22/23 1918 08/23/23 0332 08/23/23 2056  GLUCAP 93 100* 112* 112* 90       Signed:  Silvano Bilis MD.  [facility] 08/28/2023, 2:46 PM

## 2023-08-28 NOTE — Progress Notes (Signed)
PROGRESS NOTE    Douglas Peterson.  WUJ:[id number] DOB: October 05, 1951 DOA: 08/16/2023 PCP: Pcp, No    Brief Narrative:   72yo with h/o ETOH use d/o, chronic hyponatremia associated with beer potomania, PAF not on AC, TBI, and HTN who presented on 10/13 with ETOH intoxication and a fall.  He had acute on chronic hyponatremia.  He has had 6 ER visits and 8 admissions in the last 6 months.  Recommended for SNF - this would be optimal for him.   [name] declining SNF.  Still exhibiting signs of withdrawal.  Poor ability to swallow.  This was noted previously by SLP.  Currently recommending n.p.o.  Will need to reevaluate swallowing ability and GOC.  Patient has been unwilling or unable to participate in his own discussion.  Daughter not willing to make decisions for him and would prefer not to be contacted unless something disastrous occurs.  10/19: Transferred to ICU yesterday for increasing agitation and possible aspiration event.  Remained stable overnight.  No evidence of acute aspiration.  10/20: No longer exhibiting clinical signs of acute withdrawal however patient remains extremely weak and fatigued.  Evaluated by therapy.  Weakness seems to be worsening.  Unsafe to discharge home at this time.  10/21: Diet advanced after conversation regarding CODE STATUS.  Patient now DNR.  Seems to be tolerating.  Still very unsteady on the feet.  10/22: Patient has been somewhat agitated over the last 24 hours.  Has called the cops twice stating we are holding him hostage.  10/23: stable awaiting placement   Assessment & Plan:   Principal Problem:   Hyponatremia Active Problems:   Alcohol intoxication (HCC)   Elevated LFTs   Chronic diastolic CHF (congestive heart failure) (HCC)   Paroxysmal A-fib (HCC)   Alcoholic cirrhosis of liver without ascites (HCC)   Ground-level fall   Protein-calorie malnutrition, severe  Hyponatremia Acute on chronic hyponatremia in the setting of beer  potomania and poor nutrition/hydration Now resolved with fluids that have since been discontinued   Alcohol intoxication  Acute alcohol withdrawal Patient has a history of severe alcohol use disorder coming in with a ethanol level of 311 with signs of intoxication on examination Now s/p withdrawal with phenobarb taper Continuing vitamins, received high-dose thiamine this hospitalization  Substernal pain Reports  substernal pain radiating to back. He has complained of at least what appears to be a similar pain in prior admissions but he says this is different. Troponin negative, lipase mildly elevated but no abdominal tenderness so doubt pancreatitis. No dyspnea or hypoxia to suggest acute pulmonary process - will check CTA dissection protocol to r/o dissection, but suspect this is esophagitis from chronic alcohol abuse - continue ppi  Elevated LFTs In the setting of chronic daily alcohol use Minimally elevated at time of admission, now normalized   Chronic diastolic CHF (congestive heart failure) (HCC) Per chart review, patient has a history of diastolic heart failure with last EF of 60-65% in 2021.   Hypovolemic on presentation in the setting of alcohol use disorder, poor nutrition and dehydration   Paroxysmal A-fib (HCC) Regular rhythm on examination with normal ventricular rate Not on AC due to high risk for falls   Alcoholic cirrhosis of liver without ascites (HCC) Per chart review, patient has a history of alcohol induced cirrhosis however his platelets are normal, and INR was normal when checked 2 months ago and 1 year ago. Resume outpatient GI follow-up   Ground-level fall Secondary to acute intoxication  and electrolyte derangements.   CT head, C-spine, chest and pelvis with no acute injuries. PT/OT evaluations Recommending SNF.   Patient now agreeable has been has chosen [facility] as top choice Awaiting authorization   Dyslipidemia Continue atorvastatin    Essential hypertension Continue home atenolol   BPH (benign prostatic hyperplasia) Continue Flomax  History TBI With chronic right side contractures/weakness   GOC  Recurrent hospitalizations and ER visits During his last hospitalization (10/3-8), he refused SNF placement and wanted to dc home with [facility] He is again recommended for SNF placement He was agreeable to home place however he is now max assist and cannot get out of bed.  He is also unsafe to give oral intake given severe oropharyngeal dysphagia at baseline.  High risk for aspiration.  Very difficult position.  At this point recommend further de-escalation of care and consideration for hospice. Patient not yet ready for hospice care.  Lengthy discussion with prior provider on 10/20 regarding his CODE STATUS who explained that we could not safely feed him because he was very high risk for aspiration and respiratory compromise.  He statedthat he wishes to eat with the knowledge that he may aspirate and is now DNR/DNI.  DVT prophylaxis: SQ Lovenox Code Status: Full Family Communication: None at bedside Disposition Plan: Status is: Inpatient Remains inpatient appropriate because: pending snf placement   Level of care: Med-Surg  Consultants:  Palliative care  Procedures:  None  Antimicrobials: None   Subjective: Seen and examined.  Calm, tolerating diet, reports ongoing substernal pain he describes as severe radiating to back though in no distress  Objective: Vitals:   08/27/23 0813 08/27/23 2046 08/27/23 2356 08/28/23 0700  BP: 133/88 (!) 143/88 128/77 136/82  Pulse: (!) 58 62 (!) 57 (!) 56  Resp: 16 18 18 16   Temp: (!) 97.5 F (36.4 C) 98.2 F (36.8 C) 98.3 F (36.8 C) 98 F (36.7 C)  TempSrc: Oral Oral    SpO2: 99% 98% 96% 98%  Weight:      Height:        Intake/Output Summary (Last 24 hours) at 08/28/2023 0829 Last data filed at 08/28/2023 0209 Gross per 24 hour  Intake 237 ml  Output 277 ml  Net  -40 ml   Filed Weights   08/16/23 0848 08/18/23 0346 08/21/23 1915  Weight: 62 kg 57.4 kg 54.2 kg    Examination:  General exam: Frail, calm, NAD Respiratory system: Bibasilar crackles, otherwise clear.  Normal work of breathing.    Cardiovascular system: S and S2, RRR, no murmurs, no pedal edema Gastrointestinal system: Thin, soft, NT/ND  Central nervous system: Alert.  Chronic right side contracture Extremities: Decreased power bilaterally. Right arm contractures Skin: No visible rashes, lesions or ulcers Psychiatry: calm    Data Reviewed: I have personally reviewed following labs and imaging studies  CBC: Recent Labs  Lab 08/22/23 0556 08/26/23 0339  WBC 6.3 6.9  NEUTROABS  --  2.5  HGB 14.9 14.1  HCT 43.4 41.5  MCV 91.6 90.6  PLT 209 220   Basic Metabolic Panel: Recent Labs  Lab 08/22/23 0556 08/26/23 0339  NA 139 138  K 3.7 3.5  CL 104 105  CO2 25 24  GLUCOSE 97 85  BUN 16 12  CREATININE 0.47* 0.54*  CALCIUM 9.3 9.0   GFR: Estimated Creatinine Clearance: 64 mL/min (A) (by C-G formula based on SCr of 0.54 mg/dL (L)). Liver Function Tests: Recent Labs  Lab 08/22/23 0556  AST 21  ALT 18  ALKPHOS 68  BILITOT 0.6  PROT 7.9  ALBUMIN 4.2   Recent Labs  Lab 08/27/23 1509  LIPASE 79*   Recent Labs  Lab 08/22/23 0556  AMMONIA 12   Coagulation Profile: No results for input(s): "INR", "PROTIME" in the last 168 hours. Cardiac Enzymes: No results for input(s): "CKTOTAL", "CKMB", "CKMBINDEX", "TROPONINI" in the last 168 hours. BNP (last 3 results) No results for input(s): "PROBNP" in the last 8760 hours. HbA1C: No results for input(s): "HGBA1C" in the last 72 hours. CBG: Recent Labs  Lab 08/22/23 1211 08/22/23 1547 08/22/23 1918 08/23/23 0332 08/23/23 2056  GLUCAP 93 100* 112* 112* 90   Lipid Profile: No results for input(s): "CHOL", "HDL", "LDLCALC", "TRIG", "CHOLHDL", "LDLDIRECT" in the last 72 hours. Thyroid Function Tests: No  results for input(s): "TSH", "T4TOTAL", "FREET4", "T3FREE", "THYROIDAB" in the last 72 hours.  Anemia Panel: No results for input(s): "VITAMINB12", "FOLATE", "FERRITIN", "TIBC", "IRON", "RETICCTPCT" in the last 72 hours. Sepsis Labs: No results for input(s): "PROCALCITON", "LATICACIDVEN" in the last 168 hours.  Recent Results (from the past 240 hour(s))  MRSA Next Gen by PCR, Nasal     Status: None   Collection Time: 08/21/23  7:17 PM   Specimen: Nasal Mucosa; Nasal Swab  Result Value Ref Range Status   MRSA by PCR Next Gen NOT DETECTED NOT DETECTED Final    Comment: (NOTE) The GeneXpert MRSA Assay (FDA approved for NASAL specimens only), is one component of a comprehensive MRSA colonization surveillance program. It is not intended to diagnose MRSA infection nor to guide or monitor treatment for MRSA infections. Test performance is not FDA approved in patients less than 36 years old. Performed at [facility], [address] [place], [place] [address]          Radiology Studies: No results found.      Scheduled Meds:  (feeding supplement) PROSource Plus  30 mL Oral BID BM   atenolol  12.5 mg Oral Daily   atorvastatin  10 mg Oral Daily   enoxaparin (LOVENOX) injection  40 mg Subcutaneous Q24H   feeding supplement  237 mL Oral TID BM   lidocaine  1 patch Transdermal Q24H   multivitamin with minerals  1 tablet Oral Daily   pantoprazole  40 mg Oral BID   tamsulosin  0.4 mg Oral QPC breakfast   Continuous Infusions:  thiamine (VITAMIN B1) injection 500 mg (08/27/23 2057)      LOS: 12 days     Silvano Bilis, MD [facility]   If 7PM-7AM, please contact night-coverage  08/28/2023, 8:29 AM

## 2023-08-28 NOTE — Progress Notes (Signed)
Physical Therapy Treatment Patient Details Name: Douglas Peterson. MRN: [id number] DOB: 09-Feb-1951 Today's Date: 08/28/2023   History of Present Illness Douglas Peterson is a 72yoM who comes to [facility] ED on 10/13 after multiple falls while intoxicated, ETOH 311 on arrival. Pt showing bruising to bilat face posterior to bilat orbits, severe back pain with mobility, all imaging studies unremarkable. Pt followed by our services for several days, severely limited in mobility compared to typical potential, 10/18 was transferred to ICU with agitation and ?aspiration, then back to floor after stable.Marland KitchenPMH: ETOH abuse, AF, HTN, Rt spastic hemiplegia s/p remote TBI.    PT Comments  [name] agreeable to PT and eager to walk. Gait training performed in hallway with single point cane and chair follow for safety. Gait pattern worsens with fatigue and distraction. Cues for safety and Min A required for steadying with loss of balance x 3 during hallway ambulation. Recommend to continue PT to maximize independence and facilitate return to prior level of function.    If plan is discharge home, recommend the following: Assist for transportation;Direct supervision/assist for financial management;Direct supervision/assist for medications management   Can travel by private vehicle     No  Equipment Recommendations  None recommended by PT    Recommendations for Other Services       Precautions / Restrictions Precautions Precautions: Fall Restrictions Weight Bearing Restrictions: No     Mobility  Bed Mobility Overal bed mobility: Needs Assistance Bed Mobility: Supine to Sit     Supine to sit: Contact guard     General bed mobility comments: cues for task initiation    Transfers Overall transfer level: Needs assistance Equipment used: Straight cane Transfers: Sit to/from Stand Sit to Stand: Min assist   Step pivot transfers: Contact guard assist       General transfer comment: steadying  assistance required with initial stand.    Ambulation/Gait Ambulation/Gait assistance: Min assist, Contact guard assist (chair follow for safety (was not required for walking a lap)) Gait Distance (Feet): 175 Feet Assistive device: Straight cane Gait Pattern/deviations: Trunk flexed, Staggering right, Leaning posteriorly, Decreased stride length, Decreased step length - right, Step-through pattern Gait velocity: decreased     General Gait Details: with distraction and fatigue, gait deficits are more pronounced with decreased RLE step length. cues for improved gait kinematics with intermittent carry over. 3x loss of balance to the right that required Min A for correction to midline   Stairs             Wheelchair Mobility     Tilt Bed    Modified Rankin (Stroke Patients Only)       Balance Overall balance assessment: Needs assistance Sitting-balance support: Feet supported Sitting balance-Leahy Scale: Fair   Postural control: Posterior lean, Right lateral lean Standing balance support: Single extremity supported, During functional activity Standing balance-Leahy Scale: Poor                              Cognition Arousal: Alert Behavior During Therapy: WFL for tasks assessed/performed Overall Cognitive Status: History of cognitive impairments - at baseline                                 General Comments: patient does overestimate his physical abilities and requires safety cues throughout session        Exercises  General Comments        Pertinent Vitals/Pain Pain Assessment Pain Assessment: No/denies pain    Home Living                          Prior Function            PT Goals (current goals can now be found in the care plan section) Acute Rehab PT Goals Patient Stated Goal: to go home PT Goal Formulation: With patient Time For Goal Achievement: 08/31/23 Potential to Achieve Goals: Fair Progress  towards PT goals: Progressing toward goals    Frequency    Min 1X/week      PT Plan      Co-evaluation              AM-PAC PT "6 Clicks" Mobility   Outcome Measure  Help needed turning from your back to your side while in a flat bed without using bedrails?: A Little Help needed moving from lying on your back to sitting on the side of a flat bed without using bedrails?: A Lot Help needed moving to and from a bed to a chair (including a wheelchair)?: A Little Help needed standing up from a chair using your arms (e.g., wheelchair or bedside chair)?: A Little Help needed to walk in hospital room?: A Lot Help needed climbing 3-5 steps with a railing? : Total 6 Click Score: 14    End of Session Equipment Utilized During Treatment: Gait belt Activity Tolerance: Patient tolerated treatment well Patient left: in chair;with call bell/phone within reach;with chair alarm set Nurse Communication: Mobility status PT Visit Diagnosis: Muscle weakness (generalized) (M62.81);Other abnormalities of gait and mobility (R26.89)     Time: 1610-9604 PT Time Calculation (min) (ACUTE ONLY): 22 min  Charges:    $Gait Training: 8-22 mins PT General Charges $$ ACUTE PT VISIT: 1 Visit                     Donna Bernard, PT, MPT    [name] 08/28/2023, 3:09 PM

## 2023-08-28 NOTE — TOC Progression Note (Signed)
Transition of Care (TOC) - Progression Note    Patient Details  Name: Douglas Peterson. MRN: [id number] Date of Birth: 05-31-1951  Transition of Care [facility]) CM/SW Contact  Truddie Hidden, RN Phone Number: 08/28/2023, 2:38 PM  Clinical Narrative:    Retrieved message from [facility] regarding authorization.  Peer to peer is being requested by noon today.  MD will need to call Call [phone] Option 3 GN#[phone] ON#[phone]   12:37pm Per MD auth approved. Tammy in admissions at [facility] notified. Per Tammy patient can be accepted today.         Expected Discharge Plan: Home w Home Health Services Barriers to Discharge: Continued Medical Work up  Expected Discharge Plan and Services         Expected Discharge Date: 08/21/23                                     Social Determinants of Health (SDOH) Interventions SDOH Screenings   Food Insecurity: Food Insecurity Present (08/19/2023)  Housing: High Risk (08/19/2023)  Transportation Needs: Unmet Transportation Needs (08/19/2023)  Utilities: At Risk (08/19/2023)  Financial Resource Strain: Unknown (06/13/2019)  Physical Activity: Unknown (06/13/2019)  Social Connections: Unknown (06/13/2019)  Stress: Unknown (06/13/2019)  Tobacco Use: Medium Risk (08/16/2023)    Readmission Risk Interventions    06/17/2023    4:23 PM 04/29/2022   10:26 AM 03/14/2022   12:48 PM  Readmission Risk Prevention Plan  Transportation Screening Complete Complete Complete  PCP or Specialist Appt within 3-5 Days   Complete  HRI or Home Care Consult Complete Complete Complete  Social Work Consult for Recovery Care Planning/Counseling Complete Complete Complete  Palliative Care Screening Not Applicable Not Applicable Not Applicable  Medication Review Oceanographer) Complete Complete Complete

## 2023-10-06 ENCOUNTER — Emergency Department: Payer: Medicare HMO

## 2023-10-06 ENCOUNTER — Observation Stay
Admission: EM | Admit: 2023-10-06 | Discharge: 2023-10-08 | Disposition: A | Discharge status: Home / Self Care | Payer: Medicare HMO | Attending: Internal Medicine | Admitting: Internal Medicine

## 2023-10-06 DIAGNOSIS — Z79899 Other long term (current) drug therapy: Secondary | ICD-10-CM | POA: Insufficient documentation

## 2023-10-06 DIAGNOSIS — F1010 Alcohol abuse, uncomplicated: Secondary | ICD-10-CM

## 2023-10-06 DIAGNOSIS — R109 Unspecified abdominal pain: Secondary | ICD-10-CM | POA: Diagnosis present

## 2023-10-06 DIAGNOSIS — Z1152 Encounter for screening for COVID-19: Secondary | ICD-10-CM | POA: Diagnosis not present

## 2023-10-06 DIAGNOSIS — E871 Hypo-osmolality and hyponatremia: Secondary | ICD-10-CM | POA: Diagnosis present

## 2023-10-06 DIAGNOSIS — I10 Essential (primary) hypertension: Secondary | ICD-10-CM | POA: Diagnosis not present

## 2023-10-06 DIAGNOSIS — F1090 Alcohol use, unspecified, uncomplicated: Secondary | ICD-10-CM | POA: Insufficient documentation

## 2023-10-06 DIAGNOSIS — K219 Gastro-esophageal reflux disease without esophagitis: Secondary | ICD-10-CM | POA: Diagnosis present

## 2023-10-06 DIAGNOSIS — F10929 Alcohol use, unspecified with intoxication, unspecified: Secondary | ICD-10-CM | POA: Diagnosis present

## 2023-10-06 DIAGNOSIS — F10920 Alcohol use, unspecified with intoxication, uncomplicated: Secondary | ICD-10-CM

## 2023-10-06 DIAGNOSIS — K5730 Diverticulosis of large intestine without perforation or abscess without bleeding: Secondary | ICD-10-CM | POA: Insufficient documentation

## 2023-10-06 DIAGNOSIS — R112 Nausea with vomiting, unspecified: Principal | ICD-10-CM | POA: Diagnosis present

## 2023-10-06 DIAGNOSIS — R1084 Generalized abdominal pain: Principal | ICD-10-CM

## 2023-10-06 DIAGNOSIS — E785 Hyperlipidemia, unspecified: Secondary | ICD-10-CM | POA: Diagnosis not present

## 2023-10-06 DIAGNOSIS — R0602 Shortness of breath: Secondary | ICD-10-CM | POA: Insufficient documentation

## 2023-10-06 LAB — COMPREHENSIVE METABOLIC PANEL
ALT: 33 U/L (ref 0–44)
AST: 40 U/L (ref 15–41)
Albumin: 4.2 g/dL (ref 3.5–5.0)
Alkaline Phosphatase: 99 U/L (ref 38–126)
Anion gap: 11 (ref 5–15)
BUN: <5 mg/dL — ABNORMAL LOW (ref 8–23)
CO2: 25 mmol/L (ref 22–32)
Calcium: 8.3 mg/dL — ABNORMAL LOW (ref 8.9–10.3)
Chloride: 96 mmol/L — ABNORMAL LOW (ref 98–111)
Creatinine, Ser: 0.55 mg/dL — ABNORMAL LOW (ref 0.61–1.24)
GFR, Estimated: >60 mL/min (ref >60)
Glucose, Bld: 119 mg/dL — ABNORMAL HIGH (ref 70–99)
Potassium: 3.7 mmol/L (ref 3.5–5.1)
Sodium: 132 mmol/L — ABNORMAL LOW (ref 135–145)
Total Bilirubin: 0.9 mg/dL (ref <1.2)
Total Protein: 7.3 g/dL (ref 6.5–8.1)

## 2023-10-06 LAB — CBC
HCT: 44.9 % (ref 39.0–52.0)
Hemoglobin: 15.6 g/dL (ref 13.0–17.0)
MCH: 30.8 pg (ref 26.0–34.0)
MCHC: 34.7 g/dL (ref 30.0–36.0)
MCV: 88.6 fL (ref 80.0–100.0)
Platelets: 260 10*3/uL (ref 150–400)
RBC: 5.07 MIL/uL (ref 4.22–5.81)
RDW: 12 % (ref 11.5–15.5)
WBC: 9.9 10*3/uL (ref 4.0–10.5)
nRBC: 0 % (ref 0.0–0.2)

## 2023-10-06 LAB — URINALYSIS, ROUTINE W REFLEX MICROSCOPIC
Bacteria, UA: NONE SEEN
Bilirubin Urine: NEGATIVE
Glucose, UA: NEGATIVE mg/dL
Ketones, ur: NEGATIVE mg/dL
Leukocytes,Ua: NEGATIVE
Nitrite: NEGATIVE
Protein, ur: NEGATIVE mg/dL
RBC / HPF: 0 RBC/hpf (ref 0–5)
Specific Gravity, Urine: 1.01 (ref 1.005–1.030)
pH: 5 (ref 5.0–8.0)

## 2023-10-06 LAB — RESP PANEL BY RT-PCR (RSV, FLU A&B, COVID)  RVPGX2
Influenza A by PCR: NEGATIVE
Influenza B by PCR: NEGATIVE
Resp Syncytial Virus by PCR: NEGATIVE
SARS Coronavirus 2 by RT PCR: NEGATIVE

## 2023-10-06 LAB — LIPASE, BLOOD: Lipase: 28 U/L (ref 11–51)

## 2023-10-06 LAB — ETHANOL: Alcohol, Ethyl (B): 245 mg/dL — ABNORMAL HIGH (ref <10)

## 2023-10-06 MED ORDER — LORAZEPAM 2 MG/ML IJ SOLN: 1.0000 mg | INTRAMUSCULAR | Status: DC | PRN

## 2023-10-06 MED ORDER — ONDANSETRON HCL 4 MG/2ML IJ SOLN
4.0000 mg | Freq: Four times a day (QID) | INTRAMUSCULAR | Status: DC | PRN
Start: 2023-10-06 — End: 2023-10-08
  Administered 2023-10-07 – 2023-10-08 (×2): 4 mg via INTRAVENOUS
  Filled 2023-10-06 (×2): qty 2

## 2023-10-06 MED ORDER — FOLIC ACID 1 MG PO TABS
1.0000 mg | ORAL_TABLET | Freq: Every day | ORAL | Status: DC
  Administered 2023-10-06 – 2023-10-08 (×3): 1 mg via ORAL
  Filled 2023-10-06 (×3): qty 1

## 2023-10-06 MED ORDER — ENSURE ENLIVE PO LIQD
237.0000 mL | Freq: Two times a day (BID) | ORAL | Status: DC
  Administered 2023-10-07 (×2): 237 mL via ORAL

## 2023-10-06 MED ORDER — TRAZODONE HCL 50 MG PO TABS
25.0000 mg | ORAL_TABLET | Freq: Every evening | ORAL | Status: DC | PRN
Start: 2023-10-06 — End: 2023-10-08
  Administered 2023-10-07: 25 mg via ORAL
  Filled 2023-10-06: qty 1

## 2023-10-06 MED ORDER — ONDANSETRON HCL 4 MG/2ML IJ SOLN
4.0000 mg | Freq: Once | INTRAMUSCULAR | Status: AC
  Administered 2023-10-06: 4 mg via INTRAVENOUS
  Filled 2023-10-06: qty 2

## 2023-10-06 MED ORDER — ATORVASTATIN CALCIUM 20 MG PO TABS
10.0000 mg | ORAL_TABLET | Freq: Every day | ORAL | Status: DC
  Administered 2023-10-07 – 2023-10-08 (×3): 10 mg via ORAL
  Filled 2023-10-06 (×3): qty 1

## 2023-10-06 MED ORDER — PANTOPRAZOLE SODIUM 40 MG IV SOLR
40.0000 mg | Freq: Two times a day (BID) | INTRAVENOUS | Status: DC
  Administered 2023-10-06 – 2023-10-08 (×4): 40 mg via INTRAVENOUS
  Filled 2023-10-06 (×4): qty 10

## 2023-10-06 MED ORDER — MORPHINE SULFATE (PF) 2 MG/ML IV SOLN: 2.0000 mg | INTRAVENOUS | Status: DC | PRN

## 2023-10-06 MED ORDER — ACETAMINOPHEN 325 MG PO TABS: 650.0000 mg | ORAL_TABLET | Freq: Four times a day (QID) | ORAL | Status: DC | PRN

## 2023-10-06 MED ORDER — PANTOPRAZOLE SODIUM 40 MG PO TBEC: 40.0000 mg | DELAYED_RELEASE_TABLET | Freq: Every day | ORAL | Status: DC

## 2023-10-06 MED ORDER — METOCLOPRAMIDE HCL 5 MG/ML IJ SOLN
10.0000 mg | Freq: Once | INTRAMUSCULAR | Status: AC
  Administered 2023-10-06: 10 mg via INTRAVENOUS
  Filled 2023-10-06: qty 2

## 2023-10-06 MED ORDER — ATENOLOL 25 MG PO TABS
25.0000 mg | ORAL_TABLET | Freq: Every day | ORAL | Status: DC
  Administered 2023-10-07 – 2023-10-08 (×3): 25 mg via ORAL
  Filled 2023-10-06 (×3): qty 1

## 2023-10-06 MED ORDER — LORAZEPAM 1 MG PO TABS
1.0000 mg | ORAL_TABLET | ORAL | Status: DC | PRN
  Administered 2023-10-06: 1 mg via ORAL
  Filled 2023-10-06: qty 1

## 2023-10-06 MED ORDER — THIAMINE HCL 100 MG/ML IJ SOLN
100.0000 mg | Freq: Every day | INTRAMUSCULAR | Status: DC
Start: 2023-10-06 — End: 2023-10-08
  Administered 2023-10-08: 100 mg via INTRAVENOUS
  Filled 2023-10-06 (×2): qty 2

## 2023-10-06 MED ORDER — IOHEXOL 300 MG/ML  SOLN
100.0000 mL | Freq: Once | INTRAMUSCULAR | Status: AC | PRN
  Administered 2023-10-06: 100 mL via INTRAVENOUS

## 2023-10-06 MED ORDER — SODIUM CHLORIDE 0.9 % IV SOLN: INTRAVENOUS | Status: AC

## 2023-10-06 MED ORDER — THIAMINE MONONITRATE 100 MG PO TABS
100.0000 mg | ORAL_TABLET | Freq: Every day | ORAL | Status: DC
Start: 2023-10-06 — End: 2023-10-08
  Administered 2023-10-06 – 2023-10-07 (×2): 100 mg via ORAL
  Filled 2023-10-06 (×3): qty 1

## 2023-10-06 MED ORDER — POLYETHYLENE GLYCOL 3350 17 G PO PACK: 17.0000 g | PACK | Freq: Every day | ORAL | Status: DC | PRN

## 2023-10-06 MED ORDER — BACLOFEN 10 MG PO TABS
5.0000 mg | ORAL_TABLET | Freq: Three times a day (TID) | ORAL | Status: DC
  Administered 2023-10-07 – 2023-10-08 (×5): 5 mg via ORAL
  Filled 2023-10-06 (×5): qty 1

## 2023-10-06 MED ORDER — MAGNESIUM HYDROXIDE 400 MG/5ML PO SUSP: 30.0000 mL | Freq: Every day | ORAL | Status: DC | PRN

## 2023-10-06 MED ORDER — SODIUM CHLORIDE 0.9 % IV BOLUS
1000.0000 mL | Freq: Once | INTRAVENOUS | Status: AC
  Administered 2023-10-06: 1000 mL via INTRAVENOUS

## 2023-10-06 MED ORDER — ENOXAPARIN SODIUM 40 MG/0.4ML IJ SOSY
40.0000 mg | PREFILLED_SYRINGE | INTRAMUSCULAR | Status: DC
  Administered 2023-10-06 – 2023-10-07 (×2): 40 mg via SUBCUTANEOUS
  Filled 2023-10-06 (×2): qty 0.4

## 2023-10-06 MED ORDER — ACETAMINOPHEN 650 MG RE SUPP: 650.0000 mg | Freq: Four times a day (QID) | RECTAL | Status: DC | PRN

## 2023-10-06 MED ORDER — ADULT MULTIVITAMIN W/MINERALS CH
1.0000 | ORAL_TABLET | Freq: Every day | ORAL | Status: DC
  Administered 2023-10-06 – 2023-10-08 (×3): 1 via ORAL
  Filled 2023-10-06 (×3): qty 1

## 2023-10-06 MED ORDER — MORPHINE SULFATE (PF) 4 MG/ML IV SOLN
4.0000 mg | Freq: Once | INTRAVENOUS | Status: AC
  Administered 2023-10-06: 4 mg via INTRAVENOUS
  Filled 2023-10-06: qty 1

## 2023-10-06 MED ORDER — ONDANSETRON HCL 4 MG PO TABS: 4.0000 mg | ORAL_TABLET | Freq: Four times a day (QID) | ORAL | Status: DC | PRN

## 2023-10-06 NOTE — Assessment & Plan Note (Signed)
-   This is likely secondary to beer potomania. -The patient will be hydrated with IV normal saline and will follow BMP. - We will restart p.o. fluid intake. - We will follow sodium levels.

## 2023-10-06 NOTE — ED Notes (Signed)
See triage notes. Patient c/o abdominal pain and sinus congestion. Patient admits to ETOH use today.

## 2023-10-06 NOTE — Assessment & Plan Note (Signed)
-   This like secondary to alcoholic or viral gastritis. - The patient will be admitted to an observation medical telemetry bed. - He will be hydrated with IV normal saline. - As needed antiemetics will be provided. - IV PPI therapy will be given.

## 2023-10-06 NOTE — Assessment & Plan Note (Signed)
-   This could be associated with mild esophagitis contributing to his recurrent nausea and vomiting. - He will be placed on IV PPI as mentioned above.

## 2023-10-06 NOTE — Assessment & Plan Note (Addendum)
-   Given history of alcohol abuse the patient will be placed on CIWA protocol. - Multivitamins, folic acid and thiamine will be provided. - I counseled him for cessation.

## 2023-10-06 NOTE — ED Triage Notes (Signed)
Pt presents to the ED via [facility] from home. Pt reports generalized abdominal pain that is 10/10. Pt reports sinus congestion x5 days. Pt does admit to ETOH use today. Pt has not eaten well in the past three days
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
## 2023-10-06 NOTE — ED Notes (Signed)
Pt waiting on iv team md aware.  Pt alert.  Siderails up x 2.

## 2023-10-06 NOTE — Assessment & Plan Note (Signed)
-   The patient will be continued on his antihypertensive therapy.

## 2023-10-06 NOTE — Assessment & Plan Note (Signed)
-   We will continue statin therapy. 

## 2023-10-06 NOTE — ED Provider Notes (Signed)
[facility] Provider Note    Event Date/Time   First MD Initiated Contact with Patient 10/06/23 1504     (approximate)   History   Abdominal Pain   HPI  Douglas Peterson. is a 72 y.o. male with a history of alcohol use disorder, hyponatremia, beer potomania, paroxysmal atrial fibrillation not on anticoagulation, traumatic brain injury, and hypertension who presents with nausea for the last 1 to 2 days associated with generalized weakness.  The patient has had several episodes of vomiting today.  He states he has not been able to eat in the last 3 days.  He states that he is last bowel movement was last night but he has been constipated.  He has crampy diffuse abdominal pain that is worse when he tries to eat.  He also reports alcohol use today.  I reviewed the past medical records.  The patient was most recently admitted to the hospitalist service in October due to hyponatremia and alcohol withdrawal.  Physical Exam   Triage Vital Signs: ED Triage Vitals  Encounter Vitals Group     BP 10/06/23 1419 127/78     Systolic BP Percentile --      Diastolic BP Percentile --      Pulse Rate 10/06/23 1419 (!) 108     Resp 10/06/23 1419 20     Temp 10/06/23 1419 97.7 F (36.5 C)     Temp Source 10/06/23 1419 Oral     SpO2 10/06/23 1418 97 %     Weight 10/06/23 1420 126 lb (57.2 kg)     Height 10/06/23 1420 5\' 4"  (1.626 m)     Head Circumference --      Peak Flow --      Pain Score 10/06/23 1420 10     Pain Loc --      Pain Education --      Exclude from Growth Chart --     Most recent vital signs: Vitals:   10/06/23 1419 10/06/23 1837  BP: 127/78 (!) 160/77  Pulse: (!) 108 77  Resp: 20 20  Temp: 97.7 F (36.5 C) 98.3 F (36.8 C)  SpO2: 94% 94%     General: Alert, weak appearing, no distress.  CV:  Good peripheral perfusion.  Resp:  Normal effort.  Abd:  No distention.  Soft with mild diffuse tenderness. Other:  No jaundice or scleral icterus.   Moist mucous membranes.   ED Results / Procedures / Treatments   Labs (all labs ordered are listed, but only abnormal results are displayed) Labs Reviewed  COMPREHENSIVE METABOLIC PANEL - Abnormal; Notable for the following components:      Result Value   Sodium 132 (*)    Chloride 96 (*)    Glucose, Bld 119 (*)    BUN <5 (*)    Creatinine, Ser 0.55 (*)    Calcium 8.3 (*)    All other components within normal limits  URINALYSIS, ROUTINE W REFLEX MICROSCOPIC - Abnormal; Notable for the following components:   Color, Urine YELLOW (*)    APPearance CLEAR (*)    Hgb urine dipstick SMALL (*)    All other components within normal limits  ETHANOL - Abnormal; Notable for the following components:   Alcohol, Ethyl (B) 245 (*)    All other components within normal limits  RESP PANEL BY RT-PCR (RSV, FLU A&B, COVID)  RVPGX2  LIPASE, BLOOD  CBC  CBC  CREATININE, SERUM  COMPREHENSIVE METABOLIC PANEL  CBC     EKG     RADIOLOGY  CT abdomen/pelvis: I independently viewed and interpreted the images; there are no dilated bowel loops or any free air or free fluid.  Radiology report indicates no acute abnormality.  PROCEDURES:  Critical Care performed: No  Procedures   MEDICATIONS ORDERED IN ED: Medications  enoxaparin (LOVENOX) injection 40 mg (has no administration in time range)  0.9 %  sodium chloride infusion (has no administration in time range)  acetaminophen (TYLENOL) tablet 650 mg (has no administration in time range)    Or  acetaminophen (TYLENOL) suppository 650 mg (has no administration in time range)  traZODone (DESYREL) tablet 25 mg (has no administration in time range)  ondansetron (ZOFRAN) tablet 4 mg (has no administration in time range)    Or  ondansetron (ZOFRAN) injection 4 mg (has no administration in time range)  magnesium hydroxide (MILK OF MAGNESIA) suspension 30 mL (has no administration in time range)  sodium chloride 0.9 % bolus 1,000 mL (0 mLs  Intravenous Stopped 10/06/23 1838)  ondansetron (ZOFRAN) injection 4 mg (4 mg Intravenous Given 10/06/23 1712)  morphine (PF) 4 MG/ML injection 4 mg (4 mg Intravenous Given 10/06/23 1712)  iohexol (OMNIPAQUE) 300 MG/ML solution 100 mL (100 mLs Intravenous Contrast Given 10/06/23 1636)  metoCLOPramide (REGLAN) injection 10 mg (10 mg Intravenous Given 10/06/23 1936)     IMPRESSION / MDM / ASSESSMENT AND PLAN / ED COURSE  I reviewed the triage vital signs and the nursing notes.  72 year old male with PMH as noted above presents with nausea vomiting, abdominal pain, generalized weakness over the last few days.  On exam he is slightly tachycardic with otherwise normal vital signs.  Abdomen is tender.  Mucous membranes are moist.  Differential diagnosis includes, but is not limited to, alcoholic gastritis, other gastritis, PUD, pancreatitis, other hepatobiliary cause, gastroenteritis, colitis, diverticulitis, SBO, volvulus, hyponatremia, other electrolyte disturbance.  We will obtain lab workup, CT abdomen/pelvis, give fluids, antiemetics, and reassess.  Patient's presentation is most consistent with acute presentation with potential threat to life or bodily function.  ----------------------------------------- 7:38 PM on 10/06/2023 -----------------------------------------  Lab workup is reassuring.  Ethanol level is 245 but other labs are unremarkable.  LFTs and lipase are normal.  There is no significant leukocytosis.  Respiratory panel is negative.  UA is negative.  CT shows no acute findings.  Overall presentation is most consistent with alcoholic gastritis.  On reassessment the patient continues to state that he feels very nauseous and sick.  He has not been able to tolerate any p.o.  I have ordered additional fluids and Reglan.  He will need admission for further management.  I consulted Dr. Arville Care from the hospitalist service; based on our discussion he agrees to evaluate the patient for  admission.    FINAL CLINICAL IMPRESSION(S) / ED DIAGNOSES   Final diagnoses:  Generalized abdominal pain  Alcohol abuse  Nausea and vomiting, unspecified vomiting type     Rx / DC Orders   ED Discharge Orders     None        Note:  This document was prepared using Dragon voice recognition software and may include unintentional dictation errors.    Dionne Bucy, MD 10/06/23 [phone]

## 2023-10-06 NOTE — ED Notes (Signed)
Report called to stephanie rn cpod nurse  pt moved to room 38.

## 2023-10-07 ENCOUNTER — Encounter: Payer: Self-pay | Admitting: Family Medicine

## 2023-10-07 ENCOUNTER — Other Ambulatory Visit: Payer: Self-pay

## 2023-10-07 DIAGNOSIS — R112 Nausea with vomiting, unspecified: Secondary | ICD-10-CM | POA: Diagnosis not present

## 2023-10-07 LAB — COMPREHENSIVE METABOLIC PANEL
ALT: 27 U/L (ref 0–44)
AST: 28 U/L (ref 15–41)
Albumin: 3.7 g/dL (ref 3.5–5.0)
Alkaline Phosphatase: 84 U/L (ref 38–126)
Anion gap: 10 (ref 5–15)
BUN: 5 mg/dL — ABNORMAL LOW (ref 8–23)
CO2: 25 mmol/L (ref 22–32)
Calcium: 8.2 mg/dL — ABNORMAL LOW (ref 8.9–10.3)
Chloride: 103 mmol/L (ref 98–111)
Creatinine, Ser: 0.55 mg/dL — ABNORMAL LOW (ref 0.61–1.24)
GFR, Estimated: >60 mL/min (ref >60)
Glucose, Bld: 83 mg/dL (ref 70–99)
Potassium: 3.7 mmol/L (ref 3.5–5.1)
Sodium: 138 mmol/L (ref 135–145)
Total Bilirubin: 1.3 mg/dL — ABNORMAL HIGH (ref <1.2)
Total Protein: 6.2 g/dL — ABNORMAL LOW (ref 6.5–8.1)

## 2023-10-07 LAB — CBC
HCT: 41.4 % (ref 39.0–52.0)
Hemoglobin: 14.4 g/dL (ref 13.0–17.0)
MCH: 31.2 pg (ref 26.0–34.0)
MCHC: 34.8 g/dL (ref 30.0–36.0)
MCV: 89.6 fL (ref 80.0–100.0)
Platelets: 175 10*3/uL (ref 150–400)
RBC: 4.62 MIL/uL (ref 4.22–5.81)
RDW: 12.2 % (ref 11.5–15.5)
WBC: 7 10*3/uL (ref 4.0–10.5)
nRBC: 0 % (ref 0.0–0.2)

## 2023-10-07 MED ORDER — AMLODIPINE BESYLATE 5 MG PO TABS
5.0000 mg | ORAL_TABLET | Freq: Every day | ORAL | Status: DC
  Administered 2023-10-07 – 2023-10-08 (×2): 5 mg via ORAL
  Filled 2023-10-07 (×2): qty 1

## 2023-10-07 NOTE — Progress Notes (Signed)
Progress Note   Patient: Douglas Peterson. ZOX:[id number] DOB: Jul 16, 1951 DOA: 10/06/2023     0 DOS: the patient was seen and examined on 10/07/2023   Brief hospital course: From HPI "Douglas Peterson. is a 72 y.o. Caucasian male with medical history significant for atrial fibrillation, alcohol abuse, hypertension, hyponatremia and TBI, who presented to the emergency room with acute onset of nausea over the last couple of days with associated generalized weakness.  Today he had several episodes of recurrent vomiting.  He has not been able to eat over the last 3 days.  He admitted to constipation and his last bowel movement was 2 days ago.  He has been having associated diffuse crampy abdominal pain that is getting worse when he tries to eat.  He continues to drink on a regular basis "  Assessment and Plan:   Intractable nausea and vomiting - This like secondary to alcoholic or viral gastritis. - Continue IV normal saline. - Continue Zofran for nausea and vomiting   Hyponatremia - This is likely secondary to beer potomania. -The patient will be hydrated with IV normal saline and will follow BMP. - We will restart p.o. fluid intake. - Monitor sodium level closely   Alcohol intoxication (HCC) - Given history of alcohol abuse the patient will be placed on CIWA protocol. - Multivitamins, folic acid and thiamine will be provided. - I counseled him for cessation.   Essential hypertension - The patient will be continued on his antihypertensive therapy.   Dyslipidemia Continue statin therapy.   GERD (gastroesophageal reflux disease) - This could be associated with mild esophagitis contributing to his recurrent nausea and vomiting. - Continue on IV PPI as mentioned above.     DVT prophylaxis: Lovenox.   Advanced Care Planning:  Code Status: Full code...cleraly discussed with the patient.  Disposition Plan: Back to previous home environment Consults called: none.    Subjective:   Patient seen and examined at bedside this morning He complains of nausea and some vomiting overnight He admits to feeling lethargic Denies chest pain or cough  Physical Exam:  GENERAL:  72 y.o.-year-old Caucasian male patient lying in the bed with no acute distress.  EYES: Pupils equal, round, reactive to light and accommodation.  HEENT: Head atraumatic, normocephalic.  NECK:  Supple, no jugular venous distention.  LUNGS: Normal breath sounds bilaterally, no wheezing,  CARDIOVASCULAR: Regular rate and rhythm ABDOMEN: Soft, nondistended EXTREMITIES: No pedal edema, cyanosis, or clubbing.  NEUROLOGIC: Cranial nerves II through XII are intact.   Vitals:   10/06/23 2334 10/07/23 0649 10/07/23 0811 10/07/23 1503  BP: (!) 159/75 (!) 143/60 (!) 165/78 (!) 162/88  Pulse: 95 62 66 74  Resp:  17 16 17   Temp: 98.2 F (36.8 C) 97.8 F (36.6 C) 98.2 F (36.8 C) 98.7 F (37.1 C)  TempSrc: Oral     SpO2: 94% 95% 93% 95%  Weight:      Height:        Data Reviewed: I have reviewed patient's CT scan of the abdomen that did not show any acute findings    Latest Ref Rng & Units 10/07/2023    5:25 AM 10/06/2023    2:10 PM 08/26/2023    3:39 AM  CBC  WBC 4.0 - 10.5 K/uL 7.0  9.9  6.9   Hemoglobin 13.0 - 17.0 g/dL 81.1  91.4  78.2   Hematocrit 39.0 - 52.0 % 41.4  44.9  41.5   Platelets 150 - 400 K/uL 175  260  220        Latest Ref Rng & Units 10/07/2023    5:25 AM 10/06/2023    2:10 PM 08/26/2023    3:39 AM  BMP  Glucose 70 - 99 mg/dL 83  604  85   BUN 8 - 23 mg/dL 5  <5  12   Creatinine 0.61 - 1.24 mg/dL 5.40  9.81  1.91   Sodium 135 - 145 mmol/L 138  132  138   Potassium 3.5 - 5.1 mmol/L 3.7  3.7  3.5   Chloride 98 - 111 mmol/L 103  96  105   CO2 22 - 32 mmol/L 25  25  24    Calcium 8.9 - 10.3 mg/dL 8.2  8.3  9.0     Family Communication: Does not want any family contact  Disposition: Home when medically stable  Time spent: 56 minutes  Author: Loyce Dys, MD 10/07/2023  6:00 PM  For on call review [email or web address].

## 2023-10-07 NOTE — Plan of Care (Signed)

## 2023-10-07 NOTE — Care Management Obs Status (Signed)
MEDICARE OBSERVATION STATUS NOTIFICATION   Patient Details  Name: Douglas Peterson. MRN: [id number] Date of Birth: 03-Mar-1951   Medicare Observation Status Notification Given:  Yes    Marlowe Sax, RN 10/07/2023, 2:15 PM

## 2023-10-07 NOTE — TOC Progression Note (Signed)
Transition of Care (TOC) - Progression Note    Patient Details  Name: Douglas Peterson. MRN: [id number] Date of Birth: February 28, 1951  Transition of Care [facility]) CM/SW Contact  Marlowe Sax, RN Phone Number: 10/07/2023, 3:40 PM  Clinical Narrative:    The patient does not have a PCP, he is agreeable for this toc CM to call and get him set up with a PCP, called [facility], was not able to reach anyone  called [facility]  They are accepting new patients, their first available appointment is Wed Jan 15th 2 PM  Ardean Larsen NP [address]  [place]   Expected Discharge Plan: Home w Home Health Services Barriers to Discharge: Continued Medical Work up  Expected Discharge Plan and Services   Discharge Planning Services: CM Consult   Living arrangements for the past 2 months: Single Family Home, Skilled Nursing Facility (was at [facility] from 10/25-11/29)                 DME Arranged: N/A DME Agency: NA       HH Arranged: PT, OT, RN HH Agency: [facility] Date HH Agency Contacted: 10/07/23 Time HH Agency Contacted: 1436 Representative spoke with at [facility] Agency: [place]   Social Determinants of Health (SDOH) Interventions SDOH Screenings   Food Insecurity: Food Insecurity Present (10/06/2023)  Housing: Low Risk  (10/06/2023)  Recent Concern: Housing - High Risk (08/19/2023)  Transportation Needs: No Transportation Needs (10/06/2023)  Recent Concern: Transportation Needs - Unmet Transportation Needs (08/19/2023)  Utilities: Not At Risk (10/07/2023)  Recent Concern: Utilities - At Risk (08/19/2023)  Financial Resource Strain: Unknown (06/13/2019)  Physical Activity: Unknown (06/13/2019)  Social Connections: Unknown (06/13/2019)  Stress: Unknown (06/13/2019)  Tobacco Use: Medium Risk (10/07/2023)    Readmission Risk Interventions    06/17/2023    4:23 PM 04/29/2022   10:26 AM 03/14/2022   12:48 PM  Readmission Risk  Prevention Plan  Transportation Screening Complete Complete Complete  PCP or Specialist Appt within 3-5 Days   Complete  HRI or Home Care Consult Complete Complete Complete  Social Work Consult for Recovery Care Planning/Counseling Complete Complete Complete  Palliative Care Screening Not Applicable Not Applicable Not Applicable  Medication Review Oceanographer) Complete Complete Complete

## 2023-10-07 NOTE — TOC Initial Note (Signed)
Transition of Care (TOC) - Progression Note    Patient Details  Name: Douglas Peterson. MRN: [id number] Date of Birth: 1951-08-14  Transition of Care [facility]) CM/SW Contact  Marlowe Sax, RN Phone Number: 10/07/2023, 2:36 PM  Clinical Narrative:    Met with the patient in the room, he is sitting in bed enjoying a burger and fries for lunch and no longer feeling nausea, he stated that he has only been home for a few days from rehab and he used 35 days, He stated that he can not go back to rehab because he will have a high out of pocket cost He has a friend bringing his cane to the hospital [facility] will accept him with a start date as early as tomorrow with PT, OT and Nursing He stated that he plans to hire a taxi or have his neighbor come get him at [place] I explained that e is observation status and reviewed the [facility]   Expected Discharge Plan: Home w Home Health Services Barriers to Discharge: Continued Medical Work up  Expected Discharge Plan and Services   Discharge Planning Services: CM Consult   Living arrangements for the past 2 months: Single Family Home, Skilled Nursing Facility (was at [facility] from 10/25-11/29)                 DME Arranged: N/A DME Agency: NA       HH Arranged: PT, OT, RN HH Agency: [facility] Date HH Agency Contacted: 10/07/23 Time HH Agency Contacted: 1436 Representative spoke with at [facility] Agency: [place]   Social Determinants of Health (SDOH) Interventions SDOH Screenings   Food Insecurity: Food Insecurity Present (10/06/2023)  Housing: Low Risk  (10/06/2023)  Recent Concern: Housing - High Risk (08/19/2023)  Transportation Needs: No Transportation Needs (10/06/2023)  Recent Concern: Transportation Needs - Unmet Transportation Needs (08/19/2023)  Utilities: Not At Risk (10/07/2023)  Recent Concern: Utilities - At Risk (08/19/2023)  Financial Resource Strain: Unknown (06/13/2019)  Physical Activity: Unknown (06/13/2019)  Social  Connections: Unknown (06/13/2019)  Stress: Unknown (06/13/2019)  Tobacco Use: Medium Risk (10/07/2023)    Readmission Risk Interventions    06/17/2023    4:23 PM 04/29/2022   10:26 AM 03/14/2022   12:48 PM  Readmission Risk Prevention Plan  Transportation Screening Complete Complete Complete  PCP or Specialist Appt within 3-5 Days   Complete  HRI or Home Care Consult Complete Complete Complete  Social Work Consult for Recovery Care Planning/Counseling Complete Complete Complete  Palliative Care Screening Not Applicable Not Applicable Not Applicable  Medication Review Oceanographer) Complete Complete Complete

## 2023-10-07 NOTE — TOC Initial Note (Signed)
Transition of Care (TOC) - Initial/Assessment Note    Patient Details  Name: Douglas Peterson. MRN: [id number] Date of Birth: 10-Nov-1950  Transition of Care [facility]) CM/SW Contact:    Marlowe Sax, RN Phone Number: 10/07/2023, 2:11 PM  Clinical Narrative:                 The patient comes from home where he lives alone  He was recently at [facility] for 35 days and discharged on 11/29, he lives at home alone and has DME At home, his drinks heavily at home He recently had DSS take legal guardianship but later had that rescinded after hiring a Clinical research associate. He was recently accepted by [facility] for Home health, he went to [facility] instead and they never opended the patient, before that [facility] has [facility], [facility] was reviewed with the patient Expected Discharge Plan:  (TBD) Barriers to Discharge: Continued Medical Work up   Patient Goals and CMS Choice            Expected Discharge Plan and Services   Discharge Planning Services: CM Consult   Living arrangements for the past 2 months: Single Family Home, Skilled Nursing Facility (was at [facility] from 10/25-11/29)                 DME Arranged: N/A DME Agency: NA                  Prior Living Arrangements/Services Living arrangements for the past 2 months: Single Family Home, Skilled Nursing Facility (was at [facility] from 10/25-11/29)   Patient language and need for interpreter reviewed:: Yes Do you feel safe going back to the place where you live?: Yes      Need for Family Participation in Patient Care: Yes (Comment) Care giver support system in place?: Yes (comment) Current home services: DME (cane, rolling walker) Criminal Activity/Legal Involvement Pertinent to Current Situation/Hospitalization: No - Comment as needed  Activities of Daily Living   ADL Screening (condition at time of admission) Independently performs ADLs?: Yes (appropriate for developmental age) Is the patient deaf or have difficulty hearing?: Yes Does the  patient have difficulty seeing, even when wearing glasses/contacts?: No Does the patient have difficulty concentrating, remembering, or making decisions?: No  Permission Sought/Granted   Permission granted to share information with : Yes, Verbal Permission Granted              Emotional Assessment Appearance:: Appears stated age Attitude/Demeanor/Rapport: Engaged Affect (typically observed): Accepting Orientation: : Oriented to Self, Oriented to Place, Oriented to  Time, Oriented to Situation Alcohol / Substance Use: Not Applicable Psych Involvement: No (comment)  Admission diagnosis:  Alcohol abuse [F10.10] Generalized abdominal pain [R10.84] Intractable nausea and vomiting [R11.2] Nausea and vomiting, unspecified vomiting type [R11.2] Patient Active Problem List   Diagnosis Date Noted   Intractable nausea and vomiting 10/06/2023   Protein-calorie malnutrition, severe 08/20/2023   Ground-level fall 08/16/2023   GERD without esophagitis 08/06/2023   Nausea 07/19/2023   Pressure injury of skin 07/11/2023   Alcohol abuse 07/11/2023   Abdominal pain 06/16/2023   Elevated lactic acid level 06/16/2023   Chronic diastolic CHF (congestive heart failure) (HCC) 06/16/2023   Increased urinary frequency 06/08/2023   Diarrhea 06/08/2023   Upper respiratory infection 06/04/2023   Enteritis 06/02/2023   Polyuria 06/02/2023   Dyslipidemia 05/01/2023   Alcohol dependence (HCC) 05/01/2023   Maggot infestation feet 05/11/2022   Unable to care for self 05/11/2022   Debility 04/25/2022  Weakness 04/23/2022   Epistaxis 04/23/2022   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   Generalized weakness 03/18/2022   Nondisplaced fracture of shaft of left clavicle, initial encounter for closed fracture 03/18/2022   BPH (benign prostatic hyperplasia) 03/18/2022   Clavicle fracture 03/17/2022   Fall 03/17/2022   CAP (community acquired pneumonia) 03/13/2022   Altered mental status, unspecified  02/20/2022   Hypotension 02/19/2022   Dysphonia 08/13/2021   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   Paresthesia of right upper extremity 08/13/2021   SIRS (systemic inflammatory response syndrome) (HCC) 08/05/2020   GERD (gastroesophageal reflux disease) 06/05/2020   Alcohol use disorder, severe, dependence (HCC) 05/28/2020   Elevated LFTs 04/06/2020   Paroxysmal A-fib (HCC) 03/17/2020   Primary insomnia 02/06/2020   Left inguinal hernia 01/31/2020   Encounter for competency evaluation    Depression 01/17/2020   Pancytopenia (HCC)    Hypomagnesemia    Alcoholic cirrhosis of liver without ascites (HCC)    ETOH abuse    Alcohol intoxication (HCC)    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) 09/27/2019   Hypokalemia 09/27/2019   Alcohol withdrawal delirium, acute, hyperactive (HCC) 09/21/2019   Pressure injury of ankle, stage 1 08/15/2019   Palliative care by specialist    Closed fracture of right proximal humerus 03/19/2019   Vitamin D deficiency 01/11/2019   Osteoporosis 12/22/2018   Closed nondisplaced fracture of acromial process of right scapula with routine healing 11/15/2018   Compression fracture of L2 vertebra with routine healing 11/15/2018   Delirium tremens (HCC) 03/08/2018   Essential hypertension 09/16/2017   Encephalopathy, 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   Steatohepatitis 12/03/2016   Abnormal liver enzymes 06/17/2016   Hereditary hemochromatosis (HCC) 06/17/2016   Anxiety 07/16/2015   Hyponatremia 07/09/2015   H/O traumatic brain injury 05/22/2014   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HCC) 05/22/2014   PCP:  Pcp, No Pharmacy:   [facility] [place] [place] - [address] [place] [address] Phone: [phone] Fax: [phone]  [facility] [address] [place] [address] Phone: [phone] Fax: [phone]     Social Determinants of Health (SDOH) Social History: SDOH  Screenings   Food Insecurity: Food Insecurity Present (10/06/2023)  Housing: Low Risk  (10/06/2023)  Recent Concern: Housing - High Risk (08/19/2023)  Transportation Needs: No Transportation Needs (10/06/2023)  Recent Concern: Transportation Needs - Unmet Transportation Needs (08/19/2023)  Utilities: Not At Risk (10/07/2023)  Recent Concern: Utilities - At Risk (08/19/2023)  Financial Resource Strain: Unknown (06/13/2019)  Physical Activity: Unknown (06/13/2019)  Social Connections: Unknown (06/13/2019)  Stress: Unknown (06/13/2019)  Tobacco Use: Medium Risk (10/07/2023)   SDOH Interventions:     Readmission Risk Interventions    06/17/2023    4:23 PM 04/29/2022   10:26 AM 03/14/2022   12:48 PM  Readmission Risk Prevention Plan  Transportation Screening Complete Complete Complete  PCP or Specialist Appt within 3-5 Days   Complete  HRI or Home Care Consult Complete Complete Complete  Social Work Consult for Recovery Care Planning/Counseling Complete Complete Complete  Palliative Care Screening Not Applicable Not Applicable Not Applicable  Medication Review Oceanographer) Complete Complete Complete

## 2023-10-07 NOTE — Plan of Care (Signed)
  Problem: Education: ?Goal: Knowledge of General Education information will improve ?Description: Including pain rating scale, medication(s)/side effects and non-pharmacologic comfort measures ?Outcome: Progressing ?  ?Problem: Clinical Measurements: ?Goal: Ability to maintain clinical measurements within normal limits will improve ?Outcome: Progressing ?  ?Problem: Elimination: ?Goal: Will not experience complications related to bowel motility ?Outcome: Progressing ?  ?Problem: Safety: ?Goal: Ability to remain free from injury will improve ?Outcome: Progressing ?  ?

## 2023-10-08 DIAGNOSIS — R112 Nausea with vomiting, unspecified: Secondary | ICD-10-CM | POA: Diagnosis not present

## 2023-10-08 LAB — CBC WITH DIFFERENTIAL/PLATELET
Abs Immature Granulocytes: 0.01 10*3/uL (ref 0.00–0.07)
Basophils Absolute: 0 10*3/uL (ref 0.0–0.1)
Basophils Relative: 1 %
Eosinophils Absolute: 0.1 10*3/uL (ref 0.0–0.5)
Eosinophils Relative: 1 %
HCT: 38.9 % — ABNORMAL LOW (ref 39.0–52.0)
Hemoglobin: 13.3 g/dL (ref 13.0–17.0)
Immature Granulocytes: 0 %
Lymphocytes Relative: 55 %
Lymphs Abs: 3.3 10*3/uL (ref 0.7–4.0)
MCH: 30.6 pg (ref 26.0–34.0)
MCHC: 34.2 g/dL (ref 30.0–36.0)
MCV: 89.6 fL (ref 80.0–100.0)
Monocytes Absolute: 0.6 10*3/uL (ref 0.1–1.0)
Monocytes Relative: 10 %
Neutro Abs: 2 10*3/uL (ref 1.7–7.7)
Neutrophils Relative %: 33 %
Platelets: 172 10*3/uL (ref 150–400)
RBC: 4.34 MIL/uL (ref 4.22–5.81)
RDW: 11.8 % (ref 11.5–15.5)
WBC: 6 10*3/uL (ref 4.0–10.5)
nRBC: 0 % (ref 0.0–0.2)

## 2023-10-08 LAB — BASIC METABOLIC PANEL
Anion gap: 11 (ref 5–15)
BUN: 8 mg/dL (ref 8–23)
CO2: 26 mmol/L (ref 22–32)
Calcium: 8.7 mg/dL — ABNORMAL LOW (ref 8.9–10.3)
Chloride: 99 mmol/L (ref 98–111)
Creatinine, Ser: 0.58 mg/dL — ABNORMAL LOW (ref 0.61–1.24)
GFR, Estimated: >60 mL/min (ref >60)
Glucose, Bld: 93 mg/dL (ref 70–99)
Potassium: 3.5 mmol/L (ref 3.5–5.1)
Sodium: 136 mmol/L (ref 135–145)

## 2023-10-08 MED ORDER — AMLODIPINE BESYLATE 5 MG PO TABS: 5.0000 mg | ORAL_TABLET | Freq: Every day | ORAL | 0 refills | Status: AC

## 2023-10-08 MED ORDER — ONDANSETRON HCL 4 MG PO TABS: 4.0000 mg | ORAL_TABLET | Freq: Four times a day (QID) | ORAL | 0 refills | Status: DC | PRN

## 2023-10-08 MED ORDER — ATORVASTATIN CALCIUM 10 MG PO TABS: 10.0000 mg | ORAL_TABLET | Freq: Every day | ORAL | 0 refills | Status: AC

## 2023-10-08 NOTE — Discharge Summary (Signed)
Physician Discharge Summary   Patient: Douglas Peterson. MRN: [id number] DOB: May 23, 1951  Admit date:     10/06/2023  Discharge date: 10/08/23  Discharge Physician: Loyce Dys   PCP: Pcp, No   Recommendations at discharge:  Follow-up with primary care physician Avoidance of alcohol use  Discharge Diagnoses:  Intractable nausea and vomiting-resolved Hyponatremia-resolved Alcohol intoxication (HCC) Essential hypertension Dyslipidemia GERD (gastroesophageal reflux disease)  Hospital Course: Douglas Quizhpi. is a 72 y.o. Caucasian male with medical history significant for atrial fibrillation, alcohol abuse, hypertension, hyponatremia and TBI, who presented to the emergency room with acute onset of nausea over the last couple of days with associated generalized weakness.  Patient presents with recurrent vomiting.  He has not been able to eat over the last 3 days.  CT scan of the abdomen did not show any acute findings in the abdomen or pelvics. His nausea and vomiting resolved and diet was advanced and patient was able to tolerate.  Patient also walking around the unit appropriately with minimal assistance.  Patient was counseled extensively about alcohol cessation.  Have therefore been cleared for discharge today with home health services.   Consultants: None Procedures performed: None Disposition: Home Diet recommendation:  Cardiac diet DISCHARGE MEDICATION: Allergies as of 10/08/2023       Reactions   Hydrochlorothiazide Other (See Comments)   Hyponatremia        Medication List     TAKE these medications    acetaminophen 500 MG tablet Commonly known as: TYLENOL Take 2 tablets (1,000 mg total) by mouth every 8 (eight) hours as needed for mild pain or moderate pain.   amLODipine 5 MG tablet Commonly known as: NORVASC Take 1 tablet (5 mg total) by mouth daily. Start taking on: October 09, 2023   atenolol 25 MG tablet Commonly known as: TENORMIN Take 0.5 tablets  (12.5 mg total) by mouth daily. What changed: how much to take   atorvastatin 10 MG tablet Commonly known as: LIPITOR Take 1 tablet (10 mg total) by mouth daily.   Baclofen 5 MG Tabs Take 1 tablet (5 mg total) by mouth 3 (three) times daily.   feeding supplement Liqd Take 237 mLs by mouth 2 (two) times daily between meals.   folic acid 1 MG tablet Commonly known as: FOLVITE Take 1 tablet (1 mg total) by mouth daily.   melatonin 5 MG Tabs Take 1 tablet (5 mg total) by mouth at bedtime.   multivitamin with minerals Tabs tablet Take 1 tablet by mouth daily.   ondansetron 4 MG tablet Commonly known as: ZOFRAN Take 1 tablet (4 mg total) by mouth every 6 (six) hours as needed for nausea.   pantoprazole 40 MG tablet Commonly known as: PROTONIX Take 1 tablet (40 mg total) by mouth daily.   polyethylene glycol 17 g packet Commonly known as: MIRALAX / GLYCOLAX Take 17 g by mouth daily as needed. Mix one tablespoon with 8oz of your favorite juice or water every day until you are having soft formed stools. Then start taking once daily if you didn't have a stool the day before.   sucralfate 1 g tablet Commonly known as: CARAFATE Take 1 tablet (1 g total) by mouth 4 (four) times daily -  with meals and at bedtime.   thiamine 100 MG tablet Commonly known as: VITAMIN B1 Take 1 tablet (100 mg total) by mouth daily.        Follow-up Information     Augusto Gamble Follow up  on 11/18/2023.   Why: at 1215               Discharge Exam: Filed Weights   10/06/23 1420  Weight: 57.2 kg   GENERAL:  72 y.o.-year-old Caucasian male patient lying in the bed with no acute distress.  EYES: Pupils equal, round, reactive to light and accommodation.  HEENT: Head atraumatic, normocephalic.  NECK:  Supple, no jugular venous distention.  LUNGS: Normal breath sounds bilaterally, no wheezing,  CARDIOVASCULAR: Regular rate and rhythm ABDOMEN: Soft, nondistended EXTREMITIES: No pedal  edema, cyanosis, or clubbing.  NEUROLOGIC: Cranial nerves II through XII are intact.   Condition at discharge: good  The results of significant diagnostics from this hospitalization (including imaging, microbiology, ancillary and laboratory) are listed below for reference.   Imaging Studies: CT ABDOMEN PELVIS W CONTRAST  Result Date: 10/06/2023 CLINICAL DATA:  Abdominal pain. EXAM: CT ABDOMEN AND PELVIS WITH CONTRAST TECHNIQUE: Multidetector CT imaging of the abdomen and pelvis was performed using the standard protocol following bolus administration of intravenous contrast. RADIATION DOSE REDUCTION: This exam was performed according to the departmental dose-optimization program which includes automated exposure control, adjustment of the mA and/or kV according to patient size and/or use of iterative reconstruction technique. CONTRAST:  OMNIPAQUE IOHEXOL 300 MG/ML  SOLN COMPARISON:  CTA of the abdomen and pelvis on 08/28/2023 FINDINGS: Lower chest: Stable atelectasis at the right lung base related to elevation of the right hemidiaphragm. Hepatobiliary: No focal liver abnormality is seen. No gallstones, gallbladder wall thickening, or biliary dilatation. Pancreas: Unremarkable. No pancreatic ductal dilatation or surrounding inflammatory changes. Spleen: Normal in size without focal abnormality. Adrenals/Urinary Tract: Adrenal glands are unremarkable. Kidneys are normal, without renal calculi, focal lesion, or hydronephrosis. Bladder is unremarkable. Stomach/Bowel: Bowel shows no evidence of obstruction, ileus, inflammation or lesion. The appendix is surgically absent. No free intraperitoneal air. Stable mild diverticulosis of the sigmoid colon without evidence of diverticulitis. Vascular/Lymphatic: Minimal atherosclerosis of the distal abdominal aorta. No aneurysmal disease. No lymphadenopathy identified. Reproductive: Prostate is unremarkable. Other: Stable left inguinal hernia containing fat as well  as a short segment of the distal descending and proximal sigmoid colon without evidence of incarceration. Musculoskeletal: Stable chronic compression fracture at L1 and prior vertebral augmentation at L2. IMPRESSION: 1. No acute findings in the abdomen or pelvis. 2. Stable left inguinal hernia containing fat as well as a short segment of the distal descending and proximal sigmoid colon without evidence of incarceration. 3. Stable mild diverticulosis of the sigmoid colon without evidence of diverticulitis. 4. Stable chronic compression fracture at L1 and prior vertebral augmentation at L2. 5. Minimal atherosclerosis of the distal abdominal aorta. Electronically Signed   By: Irish Lack M.D.   On: 10/06/2023 17:45    Microbiology: Results for orders placed or performed during the hospital encounter of 10/06/23  Resp panel by RT-PCR (RSV, Flu A&B, Covid) Anterior Nasal Swab     Status: None   Collection Time: 10/06/23  2:10 PM   Specimen: Anterior Nasal Swab  Result Value Ref Range Status   SARS Coronavirus 2 by RT PCR NEGATIVE NEGATIVE Final    Comment: (NOTE) SARS-CoV-2 target nucleic acids are NOT DETECTED.  The SARS-CoV-2 RNA is generally detectable in upper respiratory specimens during the acute phase of infection. The lowest concentration of SARS-CoV-2 viral copies this assay can detect is 138 copies/mL. A negative result does not preclude SARS-Cov-2 infection and should not be used as the sole basis for treatment or other patient  management decisions. A negative result may occur with  improper specimen collection/handling, submission of specimen other than nasopharyngeal swab, presence of viral mutation(s) within the areas targeted by this assay, and inadequate number of viral copies(<138 copies/mL). A negative result must be combined with clinical observations, patient history, and epidemiological information. The expected result is Negative.  Fact Sheet for Patients:   BloggerCourse.com  Fact Sheet for Healthcare Providers:  SeriousBroker.it  This test is no t yet approved or cleared by the [place] FDA and  has been authorized for detection and/or diagnosis of SARS-CoV-2 by FDA under an Emergency Use Authorization (EUA). This EUA will remain  in effect (meaning this test can be used) for the duration of the COVID-19 declaration under Section 564(b)(1) of the Act, 21 U.S.C.section 360bbb-3(b)(1), unless the authorization is terminated  or revoked sooner.       Influenza A by PCR NEGATIVE NEGATIVE Final   Influenza B by PCR NEGATIVE NEGATIVE Final    Comment: (NOTE) The Xpert Xpress SARS-CoV-2/FLU/RSV plus assay is intended as an aid in the diagnosis of influenza from Nasopharyngeal swab specimens and should not be used as a sole basis for treatment. Nasal washings and aspirates are unacceptable for Xpert Xpress SARS-CoV-2/FLU/RSV testing.  Fact Sheet for Patients: BloggerCourse.com  Fact Sheet for Healthcare Providers: SeriousBroker.it  This test is not yet approved or cleared by the [place] FDA and has been authorized for detection and/or diagnosis of SARS-CoV-2 by FDA under an Emergency Use Authorization (EUA). This EUA will remain in effect (meaning this test can be used) for the duration of the COVID-19 declaration under Section 564(b)(1) of the Act, 21 U.S.C. section 360bbb-3(b)(1), unless the authorization is terminated or revoked.     Resp Syncytial Virus by PCR NEGATIVE NEGATIVE Final    Comment: (NOTE) Fact Sheet for Patients: BloggerCourse.com  Fact Sheet for Healthcare Providers: SeriousBroker.it  This test is not yet approved or cleared by the [place] FDA and has been authorized for detection and/or diagnosis of SARS-CoV-2 by FDA under an Emergency Use  Authorization (EUA). This EUA will remain in effect (meaning this test can be used) for the duration of the COVID-19 declaration under Section 564(b)(1) of the Act, 21 U.S.C. section 360bbb-3(b)(1), unless the authorization is terminated or revoked.  Performed at [facility], [address] [place], [place] [address]     Labs: CBC: Recent Labs  Lab 10/06/23 1410 10/07/23 0525 10/08/23 0512  WBC 9.9 7.0 6.0  NEUTROABS  --   --  2.0  HGB 15.6 14.4 13.3  HCT 44.9 41.4 38.9*  MCV 88.6 89.6 89.6  PLT 260 175 172   Basic Metabolic Panel: Recent Labs  Lab 10/06/23 1410 10/07/23 0525 10/08/23 0512  NA 132* 138 136  K 3.7 3.7 3.5  CL 96* 103 99  CO2 25 25 26   GLUCOSE 119* 83 93  BUN <5* 5* 8  CREATININE 0.55* 0.55* 0.58*  CALCIUM 8.3* 8.2* 8.7*   Liver Function Tests: Recent Labs  Lab 10/06/23 1410 10/07/23 0525  AST 40 28  ALT 33 27  ALKPHOS 99 84  BILITOT 0.9 1.3*  PROT 7.3 6.2*  ALBUMIN 4.2 3.7   CBG: No results for input(s): "GLUCAP" in the last 168 hours.  Discharge time spent:  35 minutes.  Signed: Loyce Dys, MD [facility] 10/08/2023

## 2023-10-08 NOTE — TOC Progression Note (Signed)
Transition of Care (TOC) - Progression Note    Patient Details  Name: Douglas Peterson. MRN: [id number] Date of Birth: [birth year]  Transition of Care [facility]) CM/SW Contact  Marlowe Sax, RN Phone Number: 10/08/2023, 2:55 PM  Clinical Narrative:   the patient was walking in the hall with mobility specialist using his cane,  Met with the patient once back in the room and explained that he is being discharged, I reminded him that he was set up with [facility] PT and that we have no medical reason to keep him, he stated that he refused to leave that he could not go home in his condition,I explained that he was asked if he wanted to go back to Short term rehab and he said no, he said he had already used his free days, I explained that he could not stay in the hospital, he stated that he had been sick on his stomach  I encouraged him to stop drinking and reminded him that he was at baseline and there is no medical reason to keep him He said that medicare told him that he could stay for 48 hours, I explained that he had been here for 48 hours and that he must have misunderstood because he is observation status and that means less than 48 hours, he said that he refused, I explained we would have to get security to discharge him, he said to go ahead I had the secretary to call security Once security arrived we went into the room, the patient was on the phone with medicare and they placed him on hold I again explained that he was not able to appeal the discharge because he had not been admitted and reminded him that his stay was a observation He was refusing to allow the nurse to remove the IV and the telemetry He was prevented from hitting and kicking the staff by security while the nurse removed the IV And telemetry box He called 911 and told the dispatch that he needed help that he was being made to leave 2 additional security officers came into the room and the patient took his cane and  started shaking it at the staff and kicking at staff. The cane was removed from his hands and we explained that it would be given back to him at the entrance of the hospital, I offered to call his friend to pick him up, he said no, He then asked how he would get home, I explained if he did not want me to call his friend that he would have to call a taxi He refused to get into the wheelchair and then finally agreed and was able to walk to the wheelchair unassisted and sit down, he then was shown his bag of belongings and his cane and explained that he would be assisted and escorted to the entrance so he can leave to go home Security officers and the AD assisted the patient in a wheelchair to the entrance      Expected Discharge Plan: Home w Home Health Services Barriers to Discharge: Continued Medical Work up  Expected Discharge Plan and Services   Discharge Planning Services: CM Consult   Living arrangements for the past 2 months: Single Family Home, Skilled Nursing Facility (was at [facility] from 10/25-11/29) Expected Discharge Date: 10/08/23               DME Arranged: N/A DME Agency: NA       HH Arranged: PT,  OT, RN [facility] Agency: [facility] Date [facility] Agency Contacted: 10/07/23 Time HH Agency Contacted: [phone] Representative spoke with at [facility] Agency: [place]   Social Determinants of Health (SDOH) Interventions SDOH Screenings   Food Insecurity: Food Insecurity Present (10/06/2023)  Housing: Low Risk  (10/06/2023)  Recent Concern: Housing - High Risk (08/19/2023)  Transportation Needs: No Transportation Needs (10/06/2023)  Recent Concern: Transportation Needs - Unmet Transportation Needs (08/19/2023)  Utilities: Not At Risk (10/07/2023)  Recent Concern: Utilities - At Risk (08/19/2023)  Financial Resource Strain: Unknown (06/13/2019)  Physical Activity: Unknown (06/13/2019)  Social Connections: Unknown (06/13/2019)  Stress: Unknown (06/13/2019)  Tobacco Use: Medium Risk  (10/07/2023)    Readmission Risk Interventions    06/17/2023    4:23 PM 04/29/2022   10:26 AM 03/14/2022   12:48 PM  Readmission Risk Prevention Plan  Transportation Screening Complete Complete Complete  PCP or Specialist Appt within 3-5 Days   Complete  HRI or Home Care Consult Complete Complete Complete  Social Work Consult for Recovery Care Planning/Counseling Complete Complete Complete  Palliative Care Screening Not Applicable Not Applicable Not Applicable  Medication Review Oceanographer) Complete Complete Complete

## 2023-10-08 NOTE — Plan of Care (Signed)
  Problem: Education: Goal: Knowledge of General Education information will improve Description: Including pain rating scale, medication(s)/side effects and non-pharmacologic comfort measures Outcome: Adequate for Discharge   Problem: Health Behavior/Discharge Planning: Goal: Ability to manage health-related needs will improve Outcome: Adequate for Discharge   Problem: Clinical Measurements: Goal: Ability to maintain clinical measurements within normal limits will improve Outcome: Adequate for Discharge Goal: Will remain free from infection Outcome: Adequate for Discharge Goal: Diagnostic test results will improve Outcome: Adequate for Discharge Goal: Respiratory complications will improve Outcome: Adequate for Discharge Goal: Cardiovascular complication will be avoided Outcome: Adequate for Discharge   Problem: Activity: Goal: Risk for activity intolerance will decrease Outcome: Adequate for Discharge   Problem: Nutrition: Goal: Adequate nutrition will be maintained Outcome: Adequate for Discharge   Problem: Coping: Goal: Level of anxiety will decrease Outcome: Adequate for Discharge   Problem: Elimination: Goal: Will not experience complications related to bowel motility Outcome: Adequate for Discharge Goal: Will not experience complications related to urinary retention Outcome: Adequate for Discharge   Problem: Pain Management: Goal: General experience of comfort will improve Outcome: Adequate for Discharge   Problem: Safety: Goal: Ability to remain free from injury will improve Outcome: Adequate for Discharge   Problem: Skin Integrity: Goal: Risk for impaired skin integrity will decrease Outcome: Adequate for Discharge

## 2023-10-14 ENCOUNTER — Emergency Department: Payer: Medicare HMO

## 2023-10-14 ENCOUNTER — Other Ambulatory Visit: Payer: Self-pay

## 2023-10-14 ENCOUNTER — Inpatient Hospital Stay
Admission: EM | Admit: 2023-10-14 | Discharge: 2023-10-22 | DRG: 641 | Disposition: A | Discharge status: Home / Self Care | Payer: Medicare HMO | Attending: Internal Medicine | Admitting: Internal Medicine

## 2023-10-14 DIAGNOSIS — Z8249 Family history of ischemic heart disease and other diseases of the circulatory system: Secondary | ICD-10-CM

## 2023-10-14 DIAGNOSIS — F10920 Alcohol use, unspecified with intoxication, uncomplicated: Principal | ICD-10-CM

## 2023-10-14 DIAGNOSIS — E43 Unspecified severe protein-calorie malnutrition: Secondary | ICD-10-CM | POA: Diagnosis present

## 2023-10-14 DIAGNOSIS — I10 Essential (primary) hypertension: Secondary | ICD-10-CM | POA: Diagnosis present

## 2023-10-14 DIAGNOSIS — E878 Other disorders of electrolyte and fluid balance, not elsewhere classified: Secondary | ICD-10-CM | POA: Diagnosis present

## 2023-10-14 DIAGNOSIS — E785 Hyperlipidemia, unspecified: Secondary | ICD-10-CM | POA: Diagnosis present

## 2023-10-14 DIAGNOSIS — F10120 Alcohol abuse with intoxication, uncomplicated: Secondary | ICD-10-CM | POA: Diagnosis present

## 2023-10-14 DIAGNOSIS — Z87820 Personal history of traumatic brain injury: Secondary | ICD-10-CM

## 2023-10-14 DIAGNOSIS — Z6821 Body mass index (BMI) 21.0-21.9, adult: Secondary | ICD-10-CM

## 2023-10-14 DIAGNOSIS — I480 Paroxysmal atrial fibrillation: Secondary | ICD-10-CM | POA: Diagnosis present

## 2023-10-14 DIAGNOSIS — K2920 Alcoholic gastritis without bleeding: Secondary | ICD-10-CM | POA: Diagnosis present

## 2023-10-14 DIAGNOSIS — F10929 Alcohol use, unspecified with intoxication, unspecified: Secondary | ICD-10-CM | POA: Diagnosis present

## 2023-10-14 DIAGNOSIS — R531 Weakness: Secondary | ICD-10-CM

## 2023-10-14 DIAGNOSIS — Y908 Blood alcohol level of 240 mg/100 ml or more: Secondary | ICD-10-CM | POA: Diagnosis present

## 2023-10-14 DIAGNOSIS — E871 Hypo-osmolality and hyponatremia: Principal | ICD-10-CM | POA: Diagnosis present

## 2023-10-14 DIAGNOSIS — Z87891 Personal history of nicotine dependence: Secondary | ICD-10-CM

## 2023-10-14 DIAGNOSIS — K219 Gastro-esophageal reflux disease without esophagitis: Secondary | ICD-10-CM | POA: Diagnosis present

## 2023-10-14 MED ORDER — ALUM & MAG HYDROXIDE-SIMETH 200-200-20 MG/5ML PO SUSP
30.0000 mL | Freq: Once | ORAL | Status: AC
Start: 2023-10-14 — End: 2023-10-14
  Administered 2023-10-14: 30 mL via ORAL
  Filled 2023-10-14: qty 30

## 2023-10-14 MED ORDER — ONDANSETRON 4 MG PO TBDP
4.0000 mg | ORAL_TABLET | Freq: Once | ORAL | Status: AC
  Administered 2023-10-14: 4 mg via ORAL
  Filled 2023-10-14: qty 1

## 2023-10-14 MED ORDER — LIDOCAINE VISCOUS HCL 2 % MT SOLN
15.0000 mL | Freq: Once | OROMUCOSAL | Status: AC
  Administered 2023-10-14: 15 mL via ORAL
  Filled 2023-10-14: qty 15
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
## 2023-10-14 NOTE — ED Provider Notes (Signed)
11:30 PM  Assumed care at shift change.  Patient well known to our ED.  Here with ETOH intoxication.  Labs pending.  CT head negative.  2:22 AM     Consulted and discussed patient's case with ***, Dr. [name]  I have recommended admission and consulting physician agrees and will place admission orders.  Patient (and family if present) agree with this plan.   I reviewed all nursing notes, vitals, pertinent previous records.  All labs, EKGs, imaging ordered have been independently reviewed and interpreted by myself.

## 2023-10-14 NOTE — ED Triage Notes (Signed)
Pt BIB EMS from home. Call out was made to EMS with no answer. On EMS arrival to home pt was found altered and suspect intoxication thus transported to ED. Pt arrives saturated in urine and feces. Is alert and able to follow commands. Hx of alcohol abuse.

## 2023-10-14 NOTE — ED Provider Notes (Signed)
[facility] Provider Note    Event Date/Time   First MD Initiated Contact with Patient 10/14/23 2143     (approximate)   History   Altered Mental Status and Alcohol Intoxication   HPI  Douglas Peterson. is a 72 y.o. male   Past medical history of alcohol use, hypertension, TBI, atrial fibrillation who presents to the emergency department with alcohol intoxication.  He made a call to EMS who discovered him appearing intoxicated, soiled in feces and urine, and brought into the emergency department.  The patient admits to drinking alcohol today.  No drug use.  Has nausea but no other acute medical complaints.  He denies any trauma or pain.   External Medical Documents Reviewed: Admission notes from December 2024 when he was admitted for recurrent vomiting with a normal CT scan of the abdomen      Physical Exam   Triage Vital Signs: ED Triage Vitals  Encounter Vitals Group     BP 10/14/23 2119 (!) 178/89     Systolic BP Percentile --      Diastolic BP Percentile --      Pulse Rate 10/14/23 2119 94     Resp 10/14/23 2119 13     Temp 10/14/23 2119 97.6 F (36.4 C)     Temp Source 10/14/23 2119 Oral     SpO2 10/14/23 2119 96 %     Weight --      Height --      Head Circumference --      Peak Flow --      Pain Score 10/14/23 2118 0     Pain Loc --      Pain Education --      Exclude from Growth Chart --     Most recent vital signs: Vitals:   10/14/23 2119  BP: (!) 178/89  Pulse: 94  Resp: 13  Temp: 97.6 F (36.4 C)  SpO2: 96%    General: Awake, no distress.  CV:  Good peripheral perfusion.  Resp:  Normal effort.  Abd:  No distention.  Other:  Chronically ill-appearing, hypertensive otherwise vital signs normal.  Soft benign abdominal exam without rigidity or guarding or tenderness to any other quadrants.  Moving all extremities.  No signs of head trauma.   ED Results / Procedures / Treatments   Labs (all labs ordered are listed,  but only abnormal results are displayed) Labs Reviewed  ACETAMINOPHEN LEVEL  COMPREHENSIVE METABOLIC PANEL  ETHANOL  SALICYLATE LEVEL  CBC WITH DIFFERENTIAL/PLATELET  URINE DRUG SCREEN, QUALITATIVE ([facility] ONLY)     RADIOLOGY I independently reviewed and interpreted CT of the head see no obvious bleeding or midline shift I also reviewed radiologist's formal read.   PROCEDURES:  Critical Care performed: No  Procedures   MEDICATIONS ORDERED IN ED: Medications  ondansetron (ZOFRAN-ODT) disintegrating tablet 4 mg (has no administration in time range)  alum & mag hydroxide-simeth (MAALOX/MYLANTA) 200-200-20 MG/5ML suspension 30 mL (has no administration in time range)    And  lidocaine (XYLOCAINE) 2 % viscous mouth solution 15 mL (has no administration in time range)    IMPRESSION / MDM / ASSESSMENT AND PLAN / ED COURSE  I reviewed the triage vital signs and the nursing notes.                                Patient's presentation is most consistent with acute presentation with  potential threat to life or bodily function.  Differential diagnosis includes, but is not limited to, alcohol intoxication, head trauma/ICH, metabolic derangements   The patient is on the cardiac monitor to evaluate for evidence of arrhythmia and/or significant heart rate changes.  MDM:    This is a patient with alcohol intoxication admits to alcohol use, appears intoxicated slurred speech, complaining of nausea with a benign abdominal exam.  Will give nausea medications, basic labs toxicologic labs, sobriety hold.  CT of the head shows no acute abnormalities.       FINAL CLINICAL IMPRESSION(S) / ED DIAGNOSES   Final diagnoses:  Alcoholic intoxication without complication (HCC)     Rx / DC Orders   ED Discharge Orders     None        Note:  This document was prepared using Dragon voice recognition software and may include unintentional dictation errors.    Pilar Jarvis,  MD 10/14/23 [phone]

## 2023-10-15 ENCOUNTER — Encounter: Payer: Self-pay | Admitting: Family Medicine

## 2023-10-15 ENCOUNTER — Other Ambulatory Visit: Payer: Self-pay

## 2023-10-15 DIAGNOSIS — R112 Nausea with vomiting, unspecified: Secondary | ICD-10-CM

## 2023-10-15 DIAGNOSIS — F10920 Alcohol use, unspecified with intoxication, uncomplicated: Secondary | ICD-10-CM | POA: Diagnosis not present

## 2023-10-15 DIAGNOSIS — E871 Hypo-osmolality and hyponatremia: Secondary | ICD-10-CM | POA: Diagnosis not present

## 2023-10-15 DIAGNOSIS — I10 Essential (primary) hypertension: Secondary | ICD-10-CM

## 2023-10-15 LAB — BASIC METABOLIC PANEL
Anion gap: 11 (ref 5–15)
BUN: 10 mg/dL (ref 8–23)
CO2: 25 mmol/L (ref 22–32)
Calcium: 8.2 mg/dL — ABNORMAL LOW (ref 8.9–10.3)
Chloride: 93 mmol/L — ABNORMAL LOW (ref 98–111)
Creatinine, Ser: 0.63 mg/dL (ref 0.61–1.24)
GFR, Estimated: >60 mL/min (ref >60)
Glucose, Bld: 107 mg/dL — ABNORMAL HIGH (ref 70–99)
Potassium: 3.2 mmol/L — ABNORMAL LOW (ref 3.5–5.1)
Sodium: 129 mmol/L — ABNORMAL LOW (ref 135–145)

## 2023-10-15 LAB — COMPREHENSIVE METABOLIC PANEL
ALT: 31 U/L (ref 0–44)
AST: 58 U/L — ABNORMAL HIGH (ref 15–41)
Albumin: 4.1 g/dL (ref 3.5–5.0)
Alkaline Phosphatase: 99 U/L (ref 38–126)
Anion gap: 15 (ref 5–15)
BUN: <5 mg/dL — ABNORMAL LOW (ref 8–23)
CO2: 21 mmol/L — ABNORMAL LOW (ref 22–32)
Calcium: 7.7 mg/dL — ABNORMAL LOW (ref 8.9–10.3)
Chloride: 85 mmol/L — ABNORMAL LOW (ref 98–111)
Creatinine, Ser: 0.34 mg/dL — ABNORMAL LOW (ref 0.61–1.24)
GFR, Estimated: >60 mL/min (ref >60)
Glucose, Bld: 83 mg/dL (ref 70–99)
Potassium: 3.9 mmol/L (ref 3.5–5.1)
Sodium: 121 mmol/L — ABNORMAL LOW (ref 135–145)
Total Bilirubin: 1.5 mg/dL — ABNORMAL HIGH (ref <1.2)
Total Protein: 7.1 g/dL (ref 6.5–8.1)

## 2023-10-15 LAB — CBC
HCT: 36.2 % — ABNORMAL LOW (ref 39.0–52.0)
Hemoglobin: 13.2 g/dL (ref 13.0–17.0)
MCH: 31.3 pg (ref 26.0–34.0)
MCHC: 36.5 g/dL — ABNORMAL HIGH (ref 30.0–36.0)
MCV: 85.8 fL (ref 80.0–100.0)
Platelets: 129 10*3/uL — ABNORMAL LOW (ref 150–400)
RBC: 4.22 MIL/uL (ref 4.22–5.81)
RDW: 12 % (ref 11.5–15.5)
WBC: 5.9 10*3/uL (ref 4.0–10.5)
nRBC: 0 % (ref 0.0–0.2)

## 2023-10-15 LAB — SALICYLATE LEVEL: Salicylate Lvl: <7 mg/dL — ABNORMAL LOW (ref 7.0–30.0)

## 2023-10-15 LAB — CBC WITH DIFFERENTIAL/PLATELET
Abs Immature Granulocytes: 0 10*3/uL (ref 0.00–0.07)
Basophils Absolute: 0 10*3/uL (ref 0.0–0.1)
Basophils Relative: 0 %
Eosinophils Absolute: 0 10*3/uL (ref 0.0–0.5)
Eosinophils Relative: 0 %
HCT: 40.5 % (ref 39.0–52.0)
Hemoglobin: 14.7 g/dL (ref 13.0–17.0)
Lymphocytes Relative: 33 %
Lymphs Abs: 5.4 10*3/uL — ABNORMAL HIGH (ref 0.7–4.0)
MCH: 30.9 pg (ref 26.0–34.0)
MCHC: 36.3 g/dL — ABNORMAL HIGH (ref 30.0–36.0)
MCV: 85.3 fL (ref 80.0–100.0)
Monocytes Absolute: 1 10*3/uL (ref 0.1–1.0)
Monocytes Relative: 6 %
Neutro Abs: 9.9 10*3/uL — ABNORMAL HIGH (ref 1.7–7.7)
Neutrophils Relative %: 61 %
Platelets: 178 10*3/uL (ref 150–400)
RBC: 4.75 MIL/uL (ref 4.22–5.81)
RDW: 11.8 % (ref 11.5–15.5)
Smear Review: NORMAL
WBC: 16.3 10*3/uL — ABNORMAL HIGH (ref 4.0–10.5)
nRBC: 0 % (ref 0.0–0.2)

## 2023-10-15 LAB — ETHANOL: Alcohol, Ethyl (B): 274 mg/dL — ABNORMAL HIGH (ref <10)

## 2023-10-15 LAB — ACETAMINOPHEN LEVEL: Acetaminophen (Tylenol), Serum: <10 ug/mL — ABNORMAL LOW (ref 10–30)

## 2023-10-15 MED ORDER — TRAZODONE HCL 50 MG PO TABS: 25.0000 mg | ORAL_TABLET | Freq: Every evening | ORAL | Status: DC | PRN

## 2023-10-15 MED ORDER — POLYETHYLENE GLYCOL 3350 17 G PO PACK
17.0000 g | PACK | Freq: Every day | ORAL | Status: DC | PRN
  Administered 2023-10-20: 17 g via ORAL
  Filled 2023-10-15: qty 1

## 2023-10-15 MED ORDER — MAGNESIUM HYDROXIDE 400 MG/5ML PO SUSP
30.0000 mL | Freq: Every day | ORAL | Status: DC | PRN
Start: 2023-10-15 — End: 2023-10-22
  Administered 2023-10-18 – 2023-10-19 (×2): 30 mL via ORAL
  Filled 2023-10-15 (×2): qty 30

## 2023-10-15 MED ORDER — ACETAMINOPHEN 650 MG RE SUPP: 650.0000 mg | Freq: Four times a day (QID) | RECTAL | Status: DC | PRN

## 2023-10-15 MED ORDER — ATORVASTATIN CALCIUM 20 MG PO TABS
10.0000 mg | ORAL_TABLET | Freq: Every day | ORAL | Status: DC
Start: 2023-10-15 — End: 2023-10-15
  Administered 2023-10-15: 10 mg via ORAL
  Filled 2023-10-15: qty 1

## 2023-10-15 MED ORDER — SUCRALFATE 1 G PO TABS
1.0000 g | ORAL_TABLET | Freq: Three times a day (TID) | ORAL | Status: DC
  Administered 2023-10-15 – 2023-10-22 (×28): 1 g via ORAL
  Filled 2023-10-15 (×29): qty 1

## 2023-10-15 MED ORDER — LORAZEPAM 1 MG PO TABS
1.0000 mg | ORAL_TABLET | ORAL | Status: AC | PRN
  Administered 2023-10-15 – 2023-10-17 (×9): 1 mg via ORAL
  Filled 2023-10-15 (×9): qty 1

## 2023-10-15 MED ORDER — AMLODIPINE BESYLATE 5 MG PO TABS
5.0000 mg | ORAL_TABLET | Freq: Every day | ORAL | Status: DC
  Administered 2023-10-15 – 2023-10-22 (×8): 5 mg via ORAL
  Filled 2023-10-15 (×8): qty 1

## 2023-10-15 MED ORDER — ONDANSETRON HCL 4 MG/2ML IJ SOLN
4.0000 mg | Freq: Four times a day (QID) | INTRAMUSCULAR | Status: DC | PRN
  Administered 2023-10-15 – 2023-10-20 (×4): 4 mg via INTRAVENOUS
  Filled 2023-10-15 (×5): qty 2

## 2023-10-15 MED ORDER — FOLIC ACID 1 MG PO TABS
1.0000 mg | ORAL_TABLET | Freq: Every day | ORAL | Status: DC
  Administered 2023-10-15 – 2023-10-22 (×8): 1 mg via ORAL
  Filled 2023-10-15 (×8): qty 1

## 2023-10-15 MED ORDER — ONDANSETRON HCL 4 MG PO TABS
4.0000 mg | ORAL_TABLET | Freq: Four times a day (QID) | ORAL | Status: DC | PRN
  Administered 2023-10-19 – 2023-10-21 (×3): 4 mg via ORAL
  Filled 2023-10-15 (×3): qty 1

## 2023-10-15 MED ORDER — PANTOPRAZOLE SODIUM 40 MG IV SOLR
40.0000 mg | Freq: Two times a day (BID) | INTRAVENOUS | Status: DC
Start: 2023-10-15 — End: 2023-10-18
  Administered 2023-10-15 – 2023-10-17 (×6): 40 mg via INTRAVENOUS
  Filled 2023-10-15 (×6): qty 10

## 2023-10-15 MED ORDER — ENSURE ENLIVE PO LIQD
237.0000 mL | Freq: Two times a day (BID) | ORAL | Status: DC
  Administered 2023-10-15 – 2023-10-22 (×12): 237 mL via ORAL

## 2023-10-15 MED ORDER — LORAZEPAM 2 MG/ML IJ SOLN
1.0000 mg | INTRAMUSCULAR | Status: AC | PRN
  Administered 2023-10-16: 2 mg via INTRAVENOUS
  Filled 2023-10-15: qty 1

## 2023-10-15 MED ORDER — ADULT MULTIVITAMIN W/MINERALS CH
1.0000 | ORAL_TABLET | Freq: Every day | ORAL | Status: DC
Start: 2023-10-15 — End: 2023-10-22
  Administered 2023-10-15 – 2023-10-22 (×8): 1 via ORAL
  Filled 2023-10-15 (×8): qty 1

## 2023-10-15 MED ORDER — PANTOPRAZOLE SODIUM 40 MG PO TBEC
40.0000 mg | DELAYED_RELEASE_TABLET | Freq: Every day | ORAL | Status: DC
  Administered 2023-10-15: 40 mg via ORAL
  Filled 2023-10-15: qty 1

## 2023-10-15 MED ORDER — ONDANSETRON HCL 4 MG/2ML IJ SOLN
4.0000 mg | Freq: Once | INTRAMUSCULAR | Status: AC
  Administered 2023-10-15: 4 mg via INTRAVENOUS

## 2023-10-15 MED ORDER — ADULT MULTIVITAMIN W/MINERALS CH: 1.0000 | ORAL_TABLET | Freq: Every day | ORAL | Status: DC

## 2023-10-15 MED ORDER — ENOXAPARIN SODIUM 40 MG/0.4ML IJ SOSY
40.0000 mg | PREFILLED_SYRINGE | INTRAMUSCULAR | Status: DC
  Administered 2023-10-16 – 2023-10-22 (×7): 40 mg via SUBCUTANEOUS
  Filled 2023-10-15 (×7): qty 0.4

## 2023-10-15 MED ORDER — SODIUM CHLORIDE 0.9 % IV SOLN: INTRAVENOUS | Status: DC

## 2023-10-15 MED ORDER — FOLIC ACID 1 MG PO TABS: 1.0000 mg | ORAL_TABLET | Freq: Every day | ORAL | Status: DC

## 2023-10-15 MED ORDER — ACETAMINOPHEN 325 MG PO TABS
650.0000 mg | ORAL_TABLET | Freq: Four times a day (QID) | ORAL | Status: DC | PRN
Start: 2023-10-15 — End: 2023-10-22
  Administered 2023-10-18: 650 mg via ORAL
  Filled 2023-10-15: qty 2

## 2023-10-15 MED ORDER — SODIUM CHLORIDE 0.9 % IV SOLN
INTRAVENOUS | Status: AC
Start: 2023-10-15 — End: 2023-10-16

## 2023-10-15 MED ORDER — ATENOLOL 25 MG PO TABS
12.5000 mg | ORAL_TABLET | Freq: Every day | ORAL | Status: DC
  Administered 2023-10-15 – 2023-10-22 (×8): 12.5 mg via ORAL
  Filled 2023-10-15 (×8): qty 1

## 2023-10-15 MED ORDER — THIAMINE HCL 100 MG PO TABS
100.0000 mg | ORAL_TABLET | Freq: Every day | ORAL | Status: DC
  Administered 2023-10-15 – 2023-10-22 (×8): 100 mg via ORAL
  Filled 2023-10-15 (×16): qty 1

## 2023-10-15 MED ORDER — MELATONIN 5 MG PO TABS
5.0000 mg | ORAL_TABLET | Freq: Every day | ORAL | Status: DC
  Administered 2023-10-15 – 2023-10-21 (×6): 5 mg via ORAL
  Filled 2023-10-15 (×7): qty 1

## 2023-10-15 MED ORDER — BACLOFEN 10 MG PO TABS
5.0000 mg | ORAL_TABLET | Freq: Three times a day (TID) | ORAL | Status: DC
Start: 2023-10-15 — End: 2023-10-22
  Administered 2023-10-15 – 2023-10-22 (×22): 5 mg via ORAL
  Filled 2023-10-15 (×22): qty 1

## 2023-10-15 NOTE — ED Notes (Signed)
Attempt X 2 for AM blood draw unsuccessful.
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
## 2023-10-15 NOTE — ED Notes (Signed)
Pt c/o nausea.  

## 2023-10-15 NOTE — Progress Notes (Signed)
Interim progress note not for billing  I have seen and examined the patient, reviewed the chart and h and p, and agree with assessment and plan unless otherwise stipulated.  Alcohol use disorder well known to our hospital, this is his twelfth admission this year, presenting intoxicated with nausea/vomiting, admitted for hyponatremia, which is 2/2 beer potomania. This typically resolves with IV fluids so will continue those, continue ciwa. No signs withdrawal at the present.
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
## 2023-10-15 NOTE — Assessment & Plan Note (Signed)
-   We will monitor him for withdrawal and place him on as needed IV Ativan. - He was counseled for cessation.

## 2023-10-15 NOTE — ED Notes (Addendum)
Patient urinated on self- this RN and Fatima Blank, NT assisted patient. Patient able to roll from side to side. New brief and bed sheets applied. Patient with red area to bottom- reports soreness. Barrier cream used.

## 2023-10-15 NOTE — ED Notes (Signed)
Pt was sat up in bed by RN and EDT so they could eat lunch. lunch tray given to pt.

## 2023-10-15 NOTE — ED Notes (Signed)
Pt had urinated all over themselves. Pt was able to roll over from side to side so this EDT could clean the pt up. [name] bottom was red and sore to the touch. Pt was repositioned in bed and covered with a blanket.

## 2023-10-15 NOTE — Assessment & Plan Note (Signed)
-   This likely secondary to beer potomania and is the main culprit for his generalized weakness. - It could be partly hypovolemic due to his suspected acute gastroenteritis. - He will be admitted to an observation medical telemetry bed. - He was counseled for cessation of alcohol. - As needed antiemetics and antidiarrheals will be provided. - P.o. fluid restrictions will be given. - We will follow sodium levels.

## 2023-10-16 DIAGNOSIS — E43 Unspecified severe protein-calorie malnutrition: Secondary | ICD-10-CM | POA: Diagnosis present

## 2023-10-16 DIAGNOSIS — E878 Other disorders of electrolyte and fluid balance, not elsewhere classified: Secondary | ICD-10-CM | POA: Diagnosis present

## 2023-10-16 DIAGNOSIS — I480 Paroxysmal atrial fibrillation: Secondary | ICD-10-CM | POA: Diagnosis present

## 2023-10-16 DIAGNOSIS — F10920 Alcohol use, unspecified with intoxication, uncomplicated: Secondary | ICD-10-CM | POA: Diagnosis not present

## 2023-10-16 DIAGNOSIS — E785 Hyperlipidemia, unspecified: Secondary | ICD-10-CM | POA: Diagnosis present

## 2023-10-16 DIAGNOSIS — Z8249 Family history of ischemic heart disease and other diseases of the circulatory system: Secondary | ICD-10-CM | POA: Diagnosis not present

## 2023-10-16 DIAGNOSIS — K2920 Alcoholic gastritis without bleeding: Secondary | ICD-10-CM | POA: Diagnosis present

## 2023-10-16 DIAGNOSIS — E871 Hypo-osmolality and hyponatremia: Secondary | ICD-10-CM | POA: Diagnosis present

## 2023-10-16 DIAGNOSIS — Z6821 Body mass index (BMI) 21.0-21.9, adult: Secondary | ICD-10-CM | POA: Diagnosis not present

## 2023-10-16 DIAGNOSIS — Z87891 Personal history of nicotine dependence: Secondary | ICD-10-CM | POA: Diagnosis not present

## 2023-10-16 DIAGNOSIS — K219 Gastro-esophageal reflux disease without esophagitis: Secondary | ICD-10-CM | POA: Diagnosis present

## 2023-10-16 DIAGNOSIS — Y908 Blood alcohol level of 240 mg/100 ml or more: Secondary | ICD-10-CM | POA: Diagnosis present

## 2023-10-16 DIAGNOSIS — Z87820 Personal history of traumatic brain injury: Secondary | ICD-10-CM | POA: Diagnosis not present

## 2023-10-16 DIAGNOSIS — I10 Essential (primary) hypertension: Secondary | ICD-10-CM | POA: Diagnosis present

## 2023-10-16 DIAGNOSIS — F10120 Alcohol abuse with intoxication, uncomplicated: Secondary | ICD-10-CM | POA: Diagnosis present

## 2023-10-16 LAB — BASIC METABOLIC PANEL
Anion gap: 11 (ref 5–15)
BUN: 8 mg/dL (ref 8–23)
CO2: 26 mmol/L (ref 22–32)
Calcium: 8.2 mg/dL — ABNORMAL LOW (ref 8.9–10.3)
Chloride: 98 mmol/L (ref 98–111)
Creatinine, Ser: 0.51 mg/dL — ABNORMAL LOW (ref 0.61–1.24)
GFR, Estimated: >60 mL/min (ref >60)
Glucose, Bld: 87 mg/dL (ref 70–99)
Potassium: 3.1 mmol/L — ABNORMAL LOW (ref 3.5–5.1)
Sodium: 135 mmol/L (ref 135–145)

## 2023-10-16 MED ORDER — POTASSIUM CHLORIDE 20 MEQ PO PACK
40.0000 meq | PACK | Freq: Once | ORAL | Status: AC
  Administered 2023-10-16: 40 meq via ORAL
  Filled 2023-10-16: qty 2
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
## 2023-10-16 NOTE — TOC Initial Note (Signed)
Transition of Care (TOC) - Initial/Assessment Note    Patient Details  Name: Douglas Peterson. MRN: [id number] Date of Birth: 1951-05-12  Transition of Care [facility]) CM/SW Contact:    Margarito Liner, LCSW Phone Number: 10/16/2023, 2:09 PM  Clinical Narrative:   Patient only oriented to self. Per notes from last admission, patient was set up with a new PCP visit at [facility] on 1/15 at 2:00 pm. He was also set up with [facility] Liaison said they had not started services yet. They were going to see him on 12/17 to start services and then begin after his PCP appointment. CSW acknowledges consult for SA resources. TOC team has provided resources on numerous occasions. Please consult again if patient wants the resources.               Expected Discharge Plan: Home w Home Health Services Barriers to Discharge: Continued Medical Work up   Patient Goals and CMS Choice            Expected Discharge Plan and Services       Living arrangements for the past 2 months: Single Family Home                             HH Agency: [facility] Date [facility] Agency Contacted: 10/16/23   Representative spoke with at [facility] Agency: Gearldine Bienenstock  Prior Living Arrangements/Services Living arrangements for the past 2 months: Single Family Home Lives with:: Self Patient language and need for interpreter reviewed:: Yes        Need for Family Participation in Patient Care: Yes (Comment) Care giver support system in place?: No (comment)   Criminal Activity/Legal Involvement Pertinent to Current Situation/Hospitalization: No - Comment as needed  Activities of Daily Living   ADL Screening (condition at time of admission) Independently performs ADLs?: No Does the patient have a NEW difficulty with bathing/dressing/toileting/self-feeding that is expected to last >3 days?: Yes (Initiates electronic notice to provider for possible OT consult) Does the patient have a NEW difficulty  with getting in/out of bed, walking, or climbing stairs that is expected to last >3 days?: Yes (Initiates electronic notice to provider for possible PT consult) Does the patient have a NEW difficulty with communication that is expected to last >3 days?: No Is the patient deaf or have difficulty hearing?: Yes Does the patient have difficulty seeing, even when wearing glasses/contacts?: No Does the patient have difficulty concentrating, remembering, or making decisions?: No  Permission Sought/Granted                  Emotional Assessment   Attitude/Demeanor/Rapport: Unable to Assess Affect (typically observed): Unable to Assess Orientation: : Oriented to Self Alcohol / Substance Use: Alcohol Use Psych Involvement: No (comment)  Admission diagnosis:  Hyponatremia [E87.1] Alcoholic intoxication without complication (HCC) [F10.920] Patient Active Problem List   Diagnosis Date Noted   Intractable nausea and vomiting 10/06/2023   Protein-calorie malnutrition, severe 08/20/2023   Ground-level fall 08/16/2023   GERD without esophagitis 08/06/2023   Nausea 07/19/2023   Pressure injury of skin 07/11/2023   Alcohol abuse 07/11/2023   Abdominal pain 06/16/2023   Elevated lactic acid level 06/16/2023   Chronic diastolic CHF (congestive heart failure) (HCC) 06/16/2023   Increased urinary frequency 06/08/2023   Diarrhea 06/08/2023   Upper respiratory infection 06/04/2023   Enteritis 06/02/2023   Polyuria 06/02/2023   Dyslipidemia 05/01/2023   Alcohol dependence (HCC)  05/01/2023   Maggot infestation feet 05/11/2022   Unable to care for self 05/11/2022   Debility 04/25/2022   Weakness 04/23/2022   Epistaxis 04/23/2022   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   Generalized weakness 03/18/2022   Nondisplaced fracture of shaft of left clavicle, initial encounter for closed fracture 03/18/2022   BPH (benign prostatic hyperplasia) 03/18/2022   Clavicle fracture 03/17/2022   Fall  03/17/2022   CAP (community acquired pneumonia) 03/13/2022   Altered mental status, unspecified 02/20/2022   Hypotension 02/19/2022   Dysphonia 08/13/2021   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   Paresthesia of right upper extremity 08/13/2021   SIRS (systemic inflammatory response syndrome) (HCC) 08/05/2020   GERD (gastroesophageal reflux disease) 06/05/2020   Alcohol use disorder, severe, dependence (HCC) 05/28/2020   Elevated LFTs 04/06/2020   Paroxysmal A-fib (HCC) 03/17/2020   Primary insomnia 02/06/2020   Left inguinal hernia 01/31/2020   Encounter for competency evaluation    Depression 01/17/2020   Pancytopenia (HCC)    Hypomagnesemia    Alcoholic cirrhosis of liver without ascites (HCC)    ETOH abuse    Alcohol intoxication (HCC)    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) 09/27/2019   Hypokalemia 09/27/2019   Alcohol withdrawal delirium, acute, hyperactive (HCC) 09/21/2019   Pressure injury of ankle, stage 1 08/15/2019   Palliative care by specialist    Closed fracture of right proximal humerus 03/19/2019   Vitamin D deficiency 01/11/2019   Osteoporosis 12/22/2018   Closed nondisplaced fracture of acromial process of right scapula with routine healing 11/15/2018   Compression fracture of L2 vertebra with routine healing 11/15/2018   Delirium tremens (HCC) 03/08/2018   Essential hypertension 09/16/2017   Encephalopathy, 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   Steatohepatitis 12/03/2016   Abnormal liver enzymes 06/17/2016   Hereditary hemochromatosis (HCC) 06/17/2016   Anxiety 07/16/2015   Hyponatremia 07/09/2015   H/O traumatic brain injury 05/22/2014   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HCC) 05/22/2014   PCP:  Pcp, No Pharmacy:   [facility] [place], [place] - [address] [place] [address] Phone: [phone] Fax: [phone]  [facility] [address] [place] [address] Phone:  [phone] Fax: [phone]     Social Drivers of Health (SDOH) Social History: SDOH Screenings   Food Insecurity: No Food Insecurity (10/15/2023)  Recent Concern: Food Insecurity - Food Insecurity Present (10/06/2023)  Housing: Low Risk  (10/15/2023)  Recent Concern: Housing - High Risk (08/19/2023)  Transportation Needs: No Transportation Needs (10/15/2023)  Recent Concern: Transportation Needs - Unmet Transportation Needs (08/19/2023)  Utilities: Not At Risk (10/15/2023)  Recent Concern: Utilities - At Risk (08/19/2023)  Financial Resource Strain: Unknown (06/13/2019)  Physical Activity: Unknown (06/13/2019)  Social Connections: Unknown (06/13/2019)  Stress: Unknown (06/13/2019)  Tobacco Use: Medium Risk (10/15/2023)   SDOH Interventions:     Readmission Risk Interventions    06/17/2023    4:23 PM 04/29/2022   10:26 AM 03/14/2022   12:48 PM  Readmission Risk Prevention Plan  Transportation Screening Complete Complete Complete  PCP or Specialist Appt within 3-5 Days   Complete  HRI or Home Care Consult Complete Complete Complete  Social Work Consult for Recovery Care Planning/Counseling Complete Complete Complete  Palliative Care Screening Not Applicable Not Applicable Not Applicable  Medication Review Oceanographer) Complete Complete Complete

## 2023-10-16 NOTE — Progress Notes (Signed)
PROGRESS NOTE    [name]  ZOX:[id number] DOB: Feb 01, 1951 DOA: 10/14/2023 PCP: Pcp, No   Brief Narrative:  This 72 years old male with PMH significant for atrial fibrillation, hypertension, TBI, alcohol use presented in the ED with acute onset of generalized weakness and altered mental status with alcohol intoxication.  He usually drinks up to 2 beers per day, he admitted nausea vomiting and diarrhea.  Patient was recently admitted for couple of days for hyponatremia with recurrent nausea and vomiting and alcohol intoxication.  Labs in the ED reveals sodium 121, chloride 85, calcium 7.7, AST 58, total bilirubin 1.5.  WBC 16.3.  Noncontrast CT head revealed no acute intracranial abnormality.  [name] admitted for further evaluation.  Assessment & Plan:   Principal Problem:   Hyponatremia Active Problems:   Alcohol intoxication (HCC)  Hyponatremia: Likely secondary to beer potomania and this is the main reason for generalized weakness. Could be partly hypovolemic due to suspected acute gastroenteritis. Patient was given IV fluid resuscitation.  Serum sodium has improved. He was counseled for cessation of alcohol. Continue as needed antiemetics and antidiarrheals. P.o. fluids should be resumed.   Alcohol intoxication (HCC): Continue CIWA protocol. He was counseled for cessation.   Intractable nausea and vomiting Likely secondary to alcoholic gastritis. Continue IV fluid resuscitation. Continue As needed antiemetic. Continue IV PPI therapy.   Essential hypertension Continue atenolol 12.5 mg daily Continue amlodipine 5 mg daily   Dyslipidemia Resume statin therapy.   GERD (gastroesophageal reflux disease) This could be associated with mild esophagitis contributing to his recurrent nausea and vomiting. Continue IV Protonix.    DVT prophylaxis: Lovenox Code Status:  Full code Family Communication: No family at bed side Disposition Plan:    Status is:  Observation The patient remains OBS appropriate and will d/c before 2 midnights.   Patient admitted for hyponatremia secondary to beer potomania and alcohol intoxication. Continue CIWA protocol,  needs PT and OT evaluation.  Consultants:  NONE  Procedures: None Antimicrobials:  Anti-infectives (From admission, onward)    None      Subjective: Patient was seen and examined at bedside.  Overnight events noted.  Patient report doing much better. Patient appears deconditioned, still feels shaky and tremulous.  Objective: Vitals:   10/16/23 0306 10/16/23 0307 10/16/23 0355 10/16/23 0415  BP: (!) 142/81 (!) 142/81 139/70 139/70  Pulse: 78 78 78 81  Resp:      Temp:      TempSrc:      SpO2:      Weight:      Height:        Intake/Output Summary (Last 24 hours) at 10/16/2023 1203 Last data filed at 10/16/2023 8119 Gross per 24 hour  Intake --  Output 250 ml  Net -250 ml   Filed Weights   10/15/23 2116  Weight: 57.1 kg    Examination:  General exam: Appears calm and comfortable, shaky and tremulous. Respiratory system: Clear to auscultation. Respiratory effort normal.  RR 16 Cardiovascular system: S1 & S2 heard, RRR. No JVD, murmurs, rubs, gallops or clicks.  Gastrointestinal system: Abdomen is non distended, soft and non tender.Normal bowel sounds heard. Central nervous system: Alert and oriented x 3. No focal neurological deficits. Extremities: No edema, no cyanosis, no clubbing Skin: No rashes, lesions or ulcers Psychiatry: Judgement and insight appear normal. Mood & affect appropriate.     Data Reviewed: I have personally reviewed following labs and imaging studies  CBC: Recent Labs  Lab  10/15/23 0000 10/15/23 2211  WBC 16.3* 5.9  NEUTROABS 9.9*  --   HGB 14.7 13.2  HCT 40.5 36.2*  MCV 85.3 85.8  PLT 178 129*   Basic Metabolic Panel: Recent Labs  Lab 10/15/23 0000 10/15/23 2211 10/16/23 0506  NA 121* 129* 135  K 3.9 3.2* 3.1*  CL 85* 93* 98   CO2 21* 25 26  GLUCOSE 83 107* 87  BUN <5* 10 8  CREATININE 0.34* 0.63 0.51*  CALCIUM 7.7* 8.2* 8.2*   GFR: Estimated Creatinine Clearance: 67.4 mL/min (A) (by C-G formula based on SCr of 0.51 mg/dL (L)). Liver Function Tests: Recent Labs  Lab 10/15/23 0000  AST 58*  ALT 31  ALKPHOS 99  BILITOT 1.5*  PROT 7.1  ALBUMIN 4.1   No results for input(s): "LIPASE", "AMYLASE" in the last 168 hours. No results for input(s): "AMMONIA" in the last 168 hours. Coagulation Profile: No results for input(s): "INR", "PROTIME" in the last 168 hours. Cardiac Enzymes: No results for input(s): "CKTOTAL", "CKMB", "CKMBINDEX", "TROPONINI" in the last 168 hours. BNP (last 3 results) No results for input(s): "PROBNP" in the last 8760 hours. HbA1C: No results for input(s): "HGBA1C" in the last 72 hours. CBG: No results for input(s): "GLUCAP" in the last 168 hours. Lipid Profile: No results for input(s): "CHOL", "HDL", "LDLCALC", "TRIG", "CHOLHDL", "LDLDIRECT" in the last 72 hours. Thyroid Function Tests: No results for input(s): "TSH", "T4TOTAL", "FREET4", "T3FREE", "THYROIDAB" in the last 72 hours. Anemia Panel: No results for input(s): "VITAMINB12", "FOLATE", "FERRITIN", "TIBC", "IRON", "RETICCTPCT" in the last 72 hours. Sepsis Labs: No results for input(s): "PROCALCITON", "LATICACIDVEN" in the last 168 hours.  Recent Results (from the past 240 hours)  Resp panel by RT-PCR (RSV, Flu A&B, Covid) Anterior Nasal Swab     Status: None   Collection Time: 10/06/23  2:10 PM   Specimen: Anterior Nasal Swab  Result Value Ref Range Status   SARS Coronavirus 2 by RT PCR NEGATIVE NEGATIVE Final    Comment: (NOTE) SARS-CoV-2 target nucleic acids are NOT DETECTED.  The SARS-CoV-2 RNA is generally detectable in upper respiratory specimens during the acute phase of infection. The lowest concentration of SARS-CoV-2 viral copies this assay can detect is 138 copies/mL. A negative result does not preclude  SARS-Cov-2 infection and should not be used as the sole basis for treatment or other patient management decisions. A negative result may occur with  improper specimen collection/handling, submission of specimen other than nasopharyngeal swab, presence of viral mutation(s) within the areas targeted by this assay, and inadequate number of viral copies(<138 copies/mL). A negative result must be combined with clinical observations, patient history, and epidemiological information. The expected result is Negative.  Fact Sheet for Patients:  BloggerCourse.com  Fact Sheet for Healthcare Providers:  SeriousBroker.it  This test is no t yet approved or cleared by the [place] FDA and  has been authorized for detection and/or diagnosis of SARS-CoV-2 by FDA under an Emergency Use Authorization (EUA). This EUA will remain  in effect (meaning this test can be used) for the duration of the COVID-19 declaration under Section 564(b)(1) of the Act, 21 U.S.C.section 360bbb-3(b)(1), unless the authorization is terminated  or revoked sooner.       Influenza A by PCR NEGATIVE NEGATIVE Final   Influenza B by PCR NEGATIVE NEGATIVE Final    Comment: (NOTE) The Xpert Xpress SARS-CoV-2/FLU/RSV plus assay is intended as an aid in the diagnosis of influenza from Nasopharyngeal swab specimens and should not be  used as a sole basis for treatment. Nasal washings and aspirates are unacceptable for Xpert Xpress SARS-CoV-2/FLU/RSV testing.  Fact Sheet for Patients: BloggerCourse.com  Fact Sheet for Healthcare Providers: SeriousBroker.it  This test is not yet approved or cleared by the [place] FDA and has been authorized for detection and/or diagnosis of SARS-CoV-2 by FDA under an Emergency Use Authorization (EUA). This EUA will remain in effect (meaning this test can be used) for the duration of  the COVID-19 declaration under Section 564(b)(1) of the Act, 21 U.S.C. section 360bbb-3(b)(1), unless the authorization is terminated or revoked.     Resp Syncytial Virus by PCR NEGATIVE NEGATIVE Final    Comment: (NOTE) Fact Sheet for Patients: BloggerCourse.com  Fact Sheet for Healthcare Providers: SeriousBroker.it  This test is not yet approved or cleared by the [place] FDA and has been authorized for detection and/or diagnosis of SARS-CoV-2 by FDA under an Emergency Use Authorization (EUA). This EUA will remain in effect (meaning this test can be used) for the duration of the COVID-19 declaration under Section 564(b)(1) of the Act, 21 U.S.C. section 360bbb-3(b)(1), unless the authorization is terminated or revoked.  Performed at [facility], [address] [place], [place] [address]     Radiology Studies: CT Head Wo Contrast Result Date: 10/14/2023 CLINICAL DATA:  Mental status change, unknown cause EXAM: CT HEAD WITHOUT CONTRAST TECHNIQUE: Contiguous axial images were obtained from the base of the skull through the vertex without intravenous contrast. RADIATION DOSE REDUCTION: This exam was performed according to the departmental dose-optimization program which includes automated exposure control, adjustment of the mA and/or kV according to patient size and/or use of iterative reconstruction technique. COMPARISON:  08/16/2023 FINDINGS: Brain: No acute intracranial abnormality. Specifically, no hemorrhage, hydrocephalus, mass lesion, acute infarction, or significant intracranial injury. Vascular: No hyperdense vessel or unexpected calcification. Skull: No acute calvarial abnormality. Sinuses/Orbits: No acute findings Other: None IMPRESSION: No acute intracranial abnormality. Electronically Signed   By: Charlett Nose M.D.   On: 10/14/2023 22:10    Scheduled Meds:  amLODipine  5 mg Oral Daily   atenolol  12.5 mg  Oral Daily   baclofen  5 mg Oral TID   enoxaparin (LOVENOX) injection  40 mg Subcutaneous Q24H   feeding supplement  237 mL Oral BID BM   folic acid  1 mg Oral Daily   melatonin  5 mg Oral QHS   multivitamin with minerals  1 tablet Oral Daily   pantoprazole (PROTONIX) IV  40 mg Intravenous Q12H   potassium chloride  40 mEq Oral Once   sucralfate  1 g Oral TID WC & HS   thiamine  100 mg Oral Daily   Continuous Infusions:   LOS: 0 days    Time spent: 50 mins    Willeen Niece, MD [facility]   If 7PM-7AM, please contact night-coverage

## 2023-10-16 NOTE — Plan of Care (Signed)

## 2023-10-17 DIAGNOSIS — E871 Hypo-osmolality and hyponatremia: Secondary | ICD-10-CM | POA: Diagnosis not present

## 2023-10-17 DIAGNOSIS — F10920 Alcohol use, unspecified with intoxication, uncomplicated: Secondary | ICD-10-CM | POA: Diagnosis not present

## 2023-10-17 LAB — BASIC METABOLIC PANEL
Anion gap: 11 (ref 5–15)
BUN: 7 mg/dL — ABNORMAL LOW (ref 8–23)
CO2: 26 mmol/L (ref 22–32)
Calcium: 9.2 mg/dL (ref 8.9–10.3)
Chloride: 97 mmol/L — ABNORMAL LOW (ref 98–111)
Creatinine, Ser: 0.5 mg/dL — ABNORMAL LOW (ref 0.61–1.24)
GFR, Estimated: >60 mL/min (ref >60)
Glucose, Bld: 126 mg/dL — ABNORMAL HIGH (ref 70–99)
Potassium: 3.6 mmol/L (ref 3.5–5.1)
Sodium: 134 mmol/L — ABNORMAL LOW (ref 135–145)

## 2023-10-17 LAB — CBC
HCT: 41.2 % (ref 39.0–52.0)
Hemoglobin: 14.5 g/dL (ref 13.0–17.0)
MCH: 30.7 pg (ref 26.0–34.0)
MCHC: 35.2 g/dL (ref 30.0–36.0)
MCV: 87.1 fL (ref 80.0–100.0)
Platelets: 116 10*3/uL — ABNORMAL LOW (ref 150–400)
RBC: 4.73 MIL/uL (ref 4.22–5.81)
RDW: 11.9 % (ref 11.5–15.5)
WBC: 9.4 10*3/uL (ref 4.0–10.5)
nRBC: 0 % (ref 0.0–0.2)

## 2023-10-17 LAB — PHOSPHORUS: Phosphorus: 2.3 mg/dL — ABNORMAL LOW (ref 2.5–4.6)

## 2023-10-17 LAB — MAGNESIUM: Magnesium: 1.9 mg/dL (ref 1.7–2.4)

## 2023-10-17 NOTE — Progress Notes (Signed)
[facility]  PROGRESS NOTE  Douglas Peterson. LOV:[id number] DOB: 1951-06-30 DOA: 10/14/2023 PCP: Pcp, No   Brief HPI:   72 years old male with PMH significant for atrial fibrillation, hypertension, TBI, alcohol use presented in the ED with acute onset of generalized weakness and altered mental status with alcohol intoxication.  He usually drinks up to 2 beers per day, he admitted nausea vomiting and diarrhea.  Patient was recently admitted for couple of days for hyponatremia with recurrent nausea and vomiting and alcohol intoxication.  Labs in the ED reveals sodium 121, chloride 85, calcium 7.7, AST 58, total bilirubin 1.5.  WBC 16.3.  Noncontrast CT head revealed no acute intracranial abnormality.  [name] admitted for further evaluation.      Assessment/Plan:   Hyponatremia: Likely secondary to beer potomania and this is the main reason for generalized weakness. -Improved, sodium is 134 He was counseled for cessation of alcohol. Continue as needed antiemetics and antidiarrheals.    Alcohol intoxication (HCC): Continue CIWA protocol. He was counseled for cessation.   Intractable nausea and vomiting Likely secondary to alcoholic gastritis. Continue As needed antiemetic. Continue IV PPI therapy.   Essential hypertension Continue atenolol 12.5 mg daily Continue amlodipine 5 mg daily   Dyslipidemia Resume statin therapy.   GERD (gastroesophageal reflux disease) Continue IV Protonix.      Medications     amLODipine  5 mg Oral Daily   atenolol  12.5 mg Oral Daily   baclofen  5 mg Oral TID   enoxaparin (LOVENOX) injection  40 mg Subcutaneous Q24H   feeding supplement  237 mL Oral BID BM   folic acid  1 mg Oral Daily   melatonin  5 mg Oral QHS   multivitamin with minerals  1 tablet Oral Daily   pantoprazole (PROTONIX) IV  40 mg Intravenous Q12H   sucralfate  1 g Oral TID WC & HS   thiamine  100 mg Oral Daily     Data Reviewed:   CBG:  No results for  input(s): "GLUCAP" in the last 168 hours.  SpO2: 97 %    Vitals:   10/17/23 0353 10/17/23 0448 10/17/23 0534 10/17/23 0916  BP: 133/81 (!) 145/89 (!) 145/89 (!) 141/89  Pulse: 89 83 83 76  Resp: 18   16  Temp: 98.2 F (36.8 C)   97.6 F (36.4 C)  TempSrc:    Oral  SpO2: 94%  96% 97%  Weight:      Height:          Data Reviewed:  Basic Metabolic Panel: Recent Labs  Lab 10/15/23 0000 10/15/23 2211 10/16/23 0506 10/17/23 0615  NA 121* 129* 135 134*  K 3.9 3.2* 3.1* 3.6  CL 85* 93* 98 97*  CO2 21* 25 26 26   GLUCOSE 83 107* 87 126*  BUN <5* 10 8 7*  CREATININE 0.34* 0.63 0.51* 0.50*  CALCIUM 7.7* 8.2* 8.2* 9.2  MG  --   --   --  1.9  PHOS  --   --   --  2.3*    CBC: Recent Labs  Lab 10/15/23 0000 10/15/23 2211 10/17/23 0615  WBC 16.3* 5.9 9.4  NEUTROABS 9.9*  --   --   HGB 14.7 13.2 14.5  HCT 40.5 36.2* 41.2  MCV 85.3 85.8 87.1  PLT 178 129* 116*    LFT Recent Labs  Lab 10/15/23 0000  AST 58*  ALT 31  ALKPHOS 99  BILITOT 1.5*  PROT 7.1  ALBUMIN 4.1     Antibiotics: Anti-infectives (From admission, onward)    None        DVT prophylaxis: Lovenox  Code Status: Full code  Family Communication: No family at bedside   CONSULTS    Subjective   Denies pain   Objective    Physical Examination:   General-appears in no acute distress Heart-S1-S2, regular, no murmur auscultated Lungs-clear to auscultation bilaterally, no wheezing or crackles auscultated Abdomen-soft, nontender, no organomegaly Extremities-no edema in the lower extremities Neuro-alert, oriented x3, no focal deficit noted   Status is: Inpatient:             Meredeth Ide   [facility] If 7PM-7AM, please contact night-coverage at [email or web address], Office  [phone]   10/17/2023, 11:28 AM  LOS: 1 day

## 2023-10-17 NOTE — Progress Notes (Signed)
Mobility Specialist - Progress Note   10/17/23 1200  Mobility  Activity Contraindicated/medical hold     Pt lying in bed upon arrival, utilizing RA. Attempted to arouse pt with voice, gentle touch, and warm wash cloth. Pt briefly aroused but too lethargic to participate in session at this time. Will attempt another date/time as appropriate.    [name] Mobility Specialist 10/17/23, 12:15 PM

## 2023-10-18 DIAGNOSIS — F10920 Alcohol use, unspecified with intoxication, uncomplicated: Secondary | ICD-10-CM | POA: Diagnosis not present

## 2023-10-18 DIAGNOSIS — E871 Hypo-osmolality and hyponatremia: Secondary | ICD-10-CM | POA: Diagnosis not present

## 2023-10-18 LAB — BASIC METABOLIC PANEL
Anion gap: 11 (ref 5–15)
BUN: 10 mg/dL (ref 8–23)
CO2: 26 mmol/L (ref 22–32)
Calcium: 9 mg/dL (ref 8.9–10.3)
Chloride: 98 mmol/L (ref 98–111)
Creatinine, Ser: 0.41 mg/dL — ABNORMAL LOW (ref 0.61–1.24)
GFR, Estimated: >60 mL/min (ref >60)
Glucose, Bld: 96 mg/dL (ref 70–99)
Potassium: 4.2 mmol/L (ref 3.5–5.1)
Sodium: 135 mmol/L (ref 135–145)

## 2023-10-18 MED ORDER — BENZONATATE 100 MG PO CAPS
200.0000 mg | ORAL_CAPSULE | Freq: Two times a day (BID) | ORAL | Status: DC | PRN
  Administered 2023-10-18 (×2): 200 mg via ORAL
  Filled 2023-10-18 (×2): qty 2

## 2023-10-18 MED ORDER — PANTOPRAZOLE SODIUM 40 MG PO TBEC
40.0000 mg | DELAYED_RELEASE_TABLET | Freq: Two times a day (BID) | ORAL | Status: DC
  Administered 2023-10-18 – 2023-10-22 (×8): 40 mg via ORAL
  Filled 2023-10-18 (×8): qty 1

## 2023-10-18 MED ORDER — SODIUM PHOSPHATES 45 MMOLE/15ML IV SOLN
30.0000 mmol | Freq: Once | INTRAVENOUS | Status: AC
  Administered 2023-10-18: 30 mmol via INTRAVENOUS
  Filled 2023-10-18: qty 10

## 2023-10-18 NOTE — Consult Note (Addendum)
PHARMACIST - PHYSICIAN COMMUNICATION  DR: Arnetha Courser, MD  CONCERNING: IV to Oral Route Change Policy  RECOMMENDATION: This patient is receiving pantoprazole by the intravenous route.  Based on criteria approved by the Pharmacy and Therapeutics Committee, the intravenous medication(s) is/are being converted to the equivalent oral dose form(s).   DESCRIPTION: These criteria include: The patient is eating (either orally or via tube) and/or has been taking other orally administered medications for a least 24 hours The patient has no evidence of active gastrointestinal bleeding or impaired GI absorption (gastrectomy, short bowel, patient on TNA or NPO).  If you have questions about this conversion, please contact the Pharmacy Department  []   [phone] )  [facility] [x]   [phone] )  [facility] []   [phone] )  [facility] []   [phone] )  [facility] []   [phone] )  Ilene Qua   Celene Squibb, PharmD Clinical Pharmacist 10/18/2023 12:40 PM

## 2023-10-18 NOTE — Progress Notes (Signed)
[facility]  PROGRESS NOTE  Douglas Peterson. ZOX:[id number] DOB: 1951/06/10 DOA: 10/14/2023 PCP: Pcp, No   Brief HPI:   72 years old male with PMH significant for atrial fibrillation, hypertension, TBI, alcohol use presented in the ED with acute onset of generalized weakness and altered mental status with alcohol intoxication.  He usually drinks up to 2 beers per day, he admitted nausea vomiting and diarrhea.  Patient was recently admitted for couple of days for hyponatremia with recurrent nausea and vomiting and alcohol intoxication.  Labs in the ED reveals sodium 121, chloride 85, calcium 7.7, AST 58, total bilirubin 1.5.  WBC 16.3.  Noncontrast CT head revealed no acute intracranial abnormality.  [name] admitted for further evaluation.  12/15: Hemodynamically stable, does not want to go to rehab and would like to go back home with home health.  Mild hypophosphatemia which is being repleted.  Multiple hospitalizations for similar reason.   Assessment/Plan:   Hyponatremia: Likely secondary to beer potomania and this is the main reason for generalized weakness. -Improved, sodium is 135 He was counseled for cessation of alcohol. Continue as needed antiemetics and antidiarrheals.  Alcohol intoxication (HCC): Continue CIWA protocol. He was counseled for cessation.   Intractable nausea and vomiting Resolved. Likely secondary to alcoholic gastritis. Continue As needed antiemetic.   Essential hypertension Continue atenolol 12.5 mg daily Continue amlodipine 5 mg daily   Dyslipidemia Resume statin therapy.   GERD (gastroesophageal reflux disease) Continue IV Protonix.   Medications     amLODipine  5 mg Oral Daily   atenolol  12.5 mg Oral Daily   baclofen  5 mg Oral TID   enoxaparin (LOVENOX) injection  40 mg Subcutaneous Q24H   feeding supplement  237 mL Oral BID BM   folic acid  1 mg Oral Daily   melatonin  5 mg Oral QHS   multivitamin with minerals  1 tablet  Oral Daily   pantoprazole  40 mg Oral BID   sucralfate  1 g Oral TID WC & HS   thiamine  100 mg Oral Daily     Data Reviewed:   CBG:  No results for input(s): "GLUCAP" in the last 168 hours.  SpO2: 95 %    Vitals:   10/17/23 2023 10/18/23 0441 10/18/23 0742 10/18/23 1422  BP:  130/89 123/74 135/88  Pulse: 73 66 67 94  Resp: 18  17 19   Temp: 98.1 F (36.7 C) 98.8 F (37.1 C) 98.3 F (36.8 C) 98.1 F (36.7 C)  TempSrc: Oral Oral Axillary Oral  SpO2: 97% 98% 97% 95%  Weight:      Height:          Data Reviewed:  Basic Metabolic Panel: Recent Labs  Lab 10/15/23 0000 10/15/23 2211 10/16/23 0506 10/17/23 0615 10/18/23 0507  NA 121* 129* 135 134* 135  K 3.9 3.2* 3.1* 3.6 4.2  CL 85* 93* 98 97* 98  CO2 21* 25 26 26 26   GLUCOSE 83 107* 87 126* 96  BUN <5* 10 8 7* 10  CREATININE 0.34* 0.63 0.51* 0.50* 0.41*  CALCIUM 7.7* 8.2* 8.2* 9.2 9.0  MG  --   --   --  1.9  --   PHOS  --   --   --  2.3*  --     CBC: Recent Labs  Lab 10/15/23 0000 10/15/23 2211 10/17/23 0615  WBC 16.3* 5.9 9.4  NEUTROABS 9.9*  --   --   HGB 14.7 13.2 14.5  HCT 40.5 36.2* 41.2  MCV 85.3 85.8 87.1  PLT 178 129* 116*    LFT Recent Labs  Lab 10/15/23 0000  AST 58*  ALT 31  ALKPHOS 99  BILITOT 1.5*  PROT 7.1  ALBUMIN 4.1     Antibiotics: Anti-infectives (From admission, onward)    None        DVT prophylaxis: Lovenox  Code Status: Full code  Family Communication: Discussed with daughter on phone.   CONSULTS    Subjective  Patient was resting comfortably when seen today.  Per patient he will not drink again.  No nausea or vomiting.   Objective    Physical Examination:  General.  Frail and malnourished elderly man, in no acute distress. Pulmonary.  Lungs clear bilaterally, normal respiratory effort. CV.  Regular rate and rhythm, no JVD, rub or murmur. Abdomen.  Soft, nontender, nondistended, BS positive. CNS.  Alert and oriented .  No focal neurologic  deficit. Extremities.  No edema, no cyanosis, pulses intact and symmetrical.   Status is: Inpatient:     Arnetha Courser MD   [facility] If 7PM-7AM, please contact night-coverage at [email or web address], Office  [phone]   10/18/2023, 3:06 PM  LOS: 2 days

## 2023-10-18 NOTE — Plan of Care (Signed)
  Problem: Education: Goal: Knowledge of General Education information will improve Description: Including pain rating scale, medication(s)/side effects and non-pharmacologic comfort measures Outcome: Progressing   Problem: Clinical Measurements: Goal: Ability to maintain clinical measurements within normal limits will improve Outcome: Progressing Goal: Will remain free from infection Outcome: Progressing Goal: Diagnostic test results will improve Outcome: Progressing Goal: Respiratory complications will improve Outcome: Progressing Goal: Cardiovascular complication will be avoided Outcome: Progressing   Problem: Pain Management: Goal: General experience of comfort will improve Outcome: Progressing

## 2023-10-18 NOTE — TOC Progression Note (Signed)
Transition of Care (TOC) - Progression Note    Patient Details  Name: Douglas Peterson. MRN: [id number] Date of Birth: November 08, 1950  Transition of Care [facility]) CM/SW Contact  Liliana Cline, LCSW Phone Number: 10/18/2023, 3:02 PM  Clinical Narrative:    CSW spoke to patient. Patient states he does not want to go to rehab. He wants to go home when he is discharged. Patient was previously arranged with [facility]. TOC to update [facility] when patient discharges.   Expected Discharge Plan: Home w Home Health Services Barriers to Discharge: Continued Medical Work up  Expected Discharge Plan and Services       Living arrangements for the past 2 months: Single Family Home                             HH Agency: [facility] Date [facility] Agency Contacted: 10/16/23   Representative spoke with at [facility] Agency: Gearldine Bienenstock   Social Determinants of Health (SDOH) Interventions SDOH Screenings   Food Insecurity: No Food Insecurity (10/15/2023)  Recent Concern: Food Insecurity - Food Insecurity Present (10/06/2023)  Housing: Low Risk  (10/15/2023)  Recent Concern: Housing - High Risk (08/19/2023)  Transportation Needs: No Transportation Needs (10/15/2023)  Recent Concern: Transportation Needs - Unmet Transportation Needs (08/19/2023)  Utilities: Not At Risk (10/15/2023)  Recent Concern: Utilities - At Risk (08/19/2023)  Financial Resource Strain: Unknown (06/13/2019)  Physical Activity: Unknown (06/13/2019)  Social Connections: Unknown (06/13/2019)  Stress: Unknown (06/13/2019)  Tobacco Use: Medium Risk (10/15/2023)    Readmission Risk Interventions    06/17/2023    4:23 PM 04/29/2022   10:26 AM 03/14/2022   12:48 PM  Readmission Risk Prevention Plan  Transportation Screening Complete Complete Complete  PCP or Specialist Appt within 3-5 Days   Complete  HRI or Home Care Consult Complete Complete Complete  Social Work Consult for Recovery Care Planning/Counseling  Complete Complete Complete  Palliative Care Screening Not Applicable Not Applicable Not Applicable  Medication Review Oceanographer) Complete Complete Complete

## 2023-10-19 DIAGNOSIS — F10920 Alcohol use, unspecified with intoxication, uncomplicated: Secondary | ICD-10-CM | POA: Diagnosis not present

## 2023-10-19 DIAGNOSIS — E871 Hypo-osmolality and hyponatremia: Secondary | ICD-10-CM | POA: Diagnosis not present

## 2023-10-19 LAB — BASIC METABOLIC PANEL
Anion gap: 8 (ref 5–15)
BUN: 14 mg/dL (ref 8–23)
CO2: 27 mmol/L (ref 22–32)
Calcium: 8.9 mg/dL (ref 8.9–10.3)
Chloride: 101 mmol/L (ref 98–111)
Creatinine, Ser: 0.53 mg/dL — ABNORMAL LOW (ref 0.61–1.24)
GFR, Estimated: >60 mL/min (ref >60)
Glucose, Bld: 92 mg/dL (ref 70–99)
Potassium: 3.9 mmol/L (ref 3.5–5.1)
Sodium: 136 mmol/L (ref 135–145)

## 2023-10-19 NOTE — Progress Notes (Signed)
Mobility Specialist - Progress Note   10/19/23 1100  Mobility  Activity Ambulated with assistance in hallway  Level of Assistance Contact guard assist, steadying assist  Assistive Device Cane  Distance Ambulated (ft) 400 ft  Activity Response Tolerated well  Mobility visit 1 Mobility     Pt lying in bed upon arrival, utilizing RA. Pt agreeable to activity. Completed bed mobility modI + extra time. STS and ambulation with CGA. Pt voiced c/o nausea upon return to room. Pt left in bed with alarm set, needs in reach. RN notified.    [name] Mobility Specialist 10/19/23, 11:16 AM

## 2023-10-19 NOTE — Progress Notes (Signed)
[facility]  PROGRESS NOTE  Douglas Peterson. WGN:[id number] DOB: [birth year] DOA: 10/14/2023 PCP: Pcp, No   Brief HPI:   72 years old male with PMH significant for atrial fibrillation, hypertension, TBI, alcohol use presented in the ED with acute onset of generalized weakness and altered mental status with alcohol intoxication.  He usually drinks up to 2 beers per day, he admitted nausea vomiting and diarrhea.  Patient was recently admitted for couple of days for hyponatremia with recurrent nausea and vomiting and alcohol intoxication.  Labs in the ED reveals sodium 121, chloride 85, calcium 7.7, AST 58, total bilirubin 1.5.  WBC 16.3.  Noncontrast CT head revealed no acute intracranial abnormality.  [name] admitted for further evaluation.  12/15: Hemodynamically stable, does not want to go to rehab and would like to go back home with home health.  Mild hypophosphatemia which is being repleted.  Multiple hospitalizations for similar reason.  12/16: Still having mild nausea and does not think that he is ready to go home.   Assessment/Plan:   Hyponatremia: Likely secondary to beer potomania and this is the main reason for generalized weakness. -Improved, sodium is 136 He was counseled for cessation of alcohol. Continue as needed antiemetics and antidiarrheals.  Alcohol intoxication (HCC): Continue CIWA protocol. He was counseled for cessation.   Intractable nausea and vomiting Resolved. Likely secondary to alcoholic gastritis. Continue As needed antiemetic.   Essential hypertension Continue atenolol 12.5 mg daily Continue amlodipine 5 mg daily   Dyslipidemia Resume statin therapy.   GERD (gastroesophageal reflux disease) Continue IV Protonix.   Medications     amLODipine  5 mg Oral Daily   atenolol  12.5 mg Oral Daily   baclofen  5 mg Oral TID   enoxaparin (LOVENOX) injection  40 mg Subcutaneous Q24H   feeding supplement  237 mL Oral BID BM   folic acid  1 mg  Oral Daily   melatonin  5 mg Oral QHS   multivitamin with minerals  1 tablet Oral Daily   pantoprazole  40 mg Oral BID   sucralfate  1 g Oral TID WC & HS   thiamine  100 mg Oral Daily     Data Reviewed:   CBG:  No results for input(s): "GLUCAP" in the last 168 hours.  SpO2: 95 %    Vitals:   10/18/23 1422 10/18/23 1943 10/19/23 0322 10/19/23 0858  BP: 135/88 125/86 118/80 131/87  Pulse: 94 76 69 74  Resp: 19 18 16 18   Temp: 98.1 F (36.7 C) 98.1 F (36.7 C) 98 F (36.7 C) 98.4 F (36.9 C)  TempSrc: Oral Oral Oral Oral  SpO2: 95% 95% 97% 95%  Weight:      Height:          Data Reviewed:  Basic Metabolic Panel: Recent Labs  Lab 10/15/23 2211 10/16/23 0506 10/17/23 0615 10/18/23 0507 10/19/23 0623  NA 129* 135 134* 135 136  K 3.2* 3.1* 3.6 4.2 3.9  CL 93* 98 97* 98 101  CO2 25 26 26 26 27   GLUCOSE 107* 87 126* 96 92  BUN 10 8 7* 10 14  CREATININE 0.63 0.51* 0.50* 0.41* 0.53*  CALCIUM 8.2* 8.2* 9.2 9.0 8.9  MG  --   --  1.9  --   --   PHOS  --   --  2.3*  --   --     CBC: Recent Labs  Lab 10/15/23 0000 10/15/23 2211 10/17/23 0615  WBC 16.3*  5.9 9.4  NEUTROABS 9.9*  --   --   HGB 14.7 13.2 14.5  HCT 40.5 36.2* 41.2  MCV 85.3 85.8 87.1  PLT 178 129* 116*    LFT Recent Labs  Lab 10/15/23 0000  AST 58*  ALT 31  ALKPHOS 99  BILITOT 1.5*  PROT 7.1  ALBUMIN 4.1     Antibiotics: Anti-infectives (From admission, onward)    None        DVT prophylaxis: Lovenox  Code Status: Full code  Family Communication: Discussed with daughter on phone.   CONSULTS    Subjective  Patient was complaining of mild nausea after eating breakfast but no vomiting.  He does not think that he is well enough to go home.   Objective    Physical Examination:  General.  Frail elderly man, in no acute distress. Pulmonary.  Lungs clear bilaterally, normal respiratory effort. CV.  Regular rate and rhythm, no JVD, rub or murmur. Abdomen.  Soft,  nontender, nondistended, BS positive. CNS.  Alert and oriented .  No focal neurologic deficit.  Right upper extremity contracture Extremities.  No edema, no cyanosis, pulses intact and symmetrical.    Status is: Inpatient:     Arnetha Courser MD   [facility] If 7PM-7AM, please contact night-coverage at [email or web address], Office  [phone]   10/19/2023, 3:07 PM  LOS: 3 days

## 2023-10-19 NOTE — Plan of Care (Signed)
  Problem: Education: Goal: Knowledge of General Education information will improve Description: Including pain rating scale, medication(s)/side effects and non-pharmacologic comfort measures 10/19/2023 1945 by Retia Passe, RN Outcome: Progressing 10/19/2023 1945 by Retia Passe, RN Outcome: Progressing   Problem: Health Behavior/Discharge Planning: Goal: Ability to manage health-related needs will improve 10/19/2023 1945 by Retia Passe, RN Outcome: Progressing 10/19/2023 1945 by Retia Passe, RN Outcome: Progressing   Problem: Clinical Measurements: Goal: Ability to maintain clinical measurements within normal limits will improve 10/19/2023 1945 by Retia Passe, RN Outcome: Progressing 10/19/2023 1945 by Retia Passe, RN Outcome: Progressing

## 2023-10-19 NOTE — Plan of Care (Signed)

## 2023-10-20 DIAGNOSIS — E871 Hypo-osmolality and hyponatremia: Secondary | ICD-10-CM | POA: Diagnosis not present

## 2023-10-20 LAB — BASIC METABOLIC PANEL
Anion gap: 10 (ref 5–15)
BUN: 14 mg/dL (ref 8–23)
CO2: 25 mmol/L (ref 22–32)
Calcium: 9.1 mg/dL (ref 8.9–10.3)
Chloride: 100 mmol/L (ref 98–111)
Creatinine, Ser: 0.52 mg/dL — ABNORMAL LOW (ref 0.61–1.24)
GFR, Estimated: >60 mL/min (ref >60)
Glucose, Bld: 87 mg/dL (ref 70–99)
Potassium: 3.8 mmol/L (ref 3.5–5.1)
Sodium: 135 mmol/L (ref 135–145)
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
## 2023-10-20 NOTE — TOC Progression Note (Signed)
Transition of Care (TOC) - Progression Note    Patient Details  Name: Douglas Peterson. MRN: [id number] Date of Birth: Mar 25, 1951  Transition of Care [facility]) CM/SW Contact  [name], RN Phone Number: 10/20/2023, 4:01 PM  Clinical Narrative:     Per MD SNF not indicated at time of discharge Patient has been cleared for discharge  Patient to call and appeal discharge  MD and bedside RN aware DC order to stay in place  Expected Discharge Plan: Home w Home Health Services Barriers to Discharge: Continued Medical Work up  Expected Discharge Plan and Services       Living arrangements for the past 2 months: Single Family Home Expected Discharge Date: 10/20/23                           [facility] Agency: [facility] Date [facility] Agency Contacted: 10/16/23   Representative spoke with at [facility] Agency: Gearldine Bienenstock   Social Determinants of Health (SDOH) Interventions SDOH Screenings   Food Insecurity: No Food Insecurity (10/15/2023)  Recent Concern: Food Insecurity - Food Insecurity Present (10/06/2023)  Housing: Low Risk  (10/15/2023)  Recent Concern: Housing - High Risk (08/19/2023)  Transportation Needs: No Transportation Needs (10/15/2023)  Recent Concern: Transportation Needs - Unmet Transportation Needs (08/19/2023)  Utilities: Not At Risk (10/15/2023)  Recent Concern: Utilities - At Risk (08/19/2023)  Financial Resource Strain: Unknown (06/13/2019)  Physical Activity: Unknown (06/13/2019)  Social Connections: Unknown (06/13/2019)  Stress: Unknown (06/13/2019)  Tobacco Use: Medium Risk (10/15/2023)    Readmission Risk Interventions    06/17/2023    4:23 PM 04/29/2022   10:26 AM 03/14/2022   12:48 PM  Readmission Risk Prevention Plan  Transportation Screening Complete Complete Complete  PCP or Specialist Appt within 3-5 Days   Complete  HRI or Home Care Consult Complete Complete Complete  Social Work Consult for Recovery Care Planning/Counseling Complete Complete  Complete  Palliative Care Screening Not Applicable Not Applicable Not Applicable  Medication Review Oceanographer) Complete Complete Complete
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
## 2023-10-21 DIAGNOSIS — E871 Hypo-osmolality and hyponatremia: Secondary | ICD-10-CM | POA: Diagnosis not present
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
## 2023-10-21 NOTE — Care Management Important Message (Signed)
Important Message  Patient Details  Name: Douglas Peterson. MRN: [id number] Date of Birth: Dec 17, 1950   Important Message Given:  Yes - Medicare IM     Bernadette Hoit 10/21/2023, 2:46 PM

## 2023-10-21 NOTE — TOC Progression Note (Signed)
Transition of Care (TOC) - Progression Note    Patient Details  Name: Douglas Peterson. MRN: [id number] Date of Birth: Apr 10, 1951  Transition of Care [facility]) CM/SW Contact  [name], RN Phone Number: 10/21/2023, 9:23 AM  Clinical Narrative:     Received notification of appeal.   DND and [place] 12 complete   Expected Discharge Plan: Home w Home Health Services Barriers to Discharge: Continued Medical Work up  Expected Discharge Plan and Services       Living arrangements for the past 2 months: Single Family Home Expected Discharge Date: 10/20/23                           [facility] Agency: [facility] Date [facility] Agency Contacted: 10/16/23   Representative spoke with at [facility] Agency: [facility]   Social Determinants of Health (SDOH) Interventions SDOH Screenings   Food Insecurity: No Food Insecurity (10/15/2023)  Recent Concern: Food Insecurity - Food Insecurity Present (10/06/2023)  Housing: Low Risk  (10/15/2023)  Recent Concern: Housing - High Risk (08/19/2023)  Transportation Needs: No Transportation Needs (10/15/2023)  Recent Concern: Transportation Needs - Unmet Transportation Needs (08/19/2023)  Utilities: Not At Risk (10/15/2023)  Recent Concern: Utilities - At Risk (08/19/2023)  Financial Resource Strain: Unknown (06/13/2019)  Physical Activity: Unknown (06/13/2019)  Social Connections: Unknown (06/13/2019)  Stress: Unknown (06/13/2019)  Tobacco Use: Medium Risk (10/15/2023)    Readmission Risk Interventions    06/17/2023    4:23 PM 04/29/2022   10:26 AM 03/14/2022   12:48 PM  Readmission Risk Prevention Plan  Transportation Screening Complete Complete Complete  PCP or Specialist Appt within 3-5 Days   Complete  HRI or Home Care Consult Complete Complete Complete  Social Work Consult for Recovery Care Planning/Counseling Complete Complete Complete  Palliative Care Screening Not Applicable Not Applicable Not Applicable  Medication Review Furniture conservator/restorer) Complete Complete Complete

## 2023-10-22 DIAGNOSIS — E871 Hypo-osmolality and hyponatremia: Secondary | ICD-10-CM | POA: Diagnosis not present

## 2023-10-22 MED ORDER — POLYETHYLENE GLYCOL 3350 17 G PO PACK
17.0000 g | PACK | Freq: Every day | ORAL | Status: DC
Start: 2023-10-22 — End: 2023-10-22

## 2023-10-22 NOTE — TOC Transition Note (Signed)
Transition of Care [facility]) - Discharge Note   Patient Details  Name: Douglas Peterson. MRN: [id number] Date of Birth: 04-Dec-1950  Transition of Care [facility]) CM/SW Contact:  [name], RN Phone Number: 10/22/2023, 11:54 AM   Clinical Narrative:     Notified by [name] that patient has lost the appeal and financial responsibility starts today at noon.    Provided patient with notice  Patient in agreement to discharge [facility] unable to accept patient back at discharge due to non compliance.  Patient would like to see if another agency can accept and states he does not have a preference of agency.  Referral made and accepted by Elnita Maxwell with [facility] voucher completed and on chart. [facility] Waiver on file     Barriers to Discharge: Continued Medical Work up   Patient Goals and CMS Choice            Discharge Placement                       Discharge Plan and Services Additional resources added to the After Visit Summary for                              [facility] Agency: [facility] Date [facility] Agency Contacted: 10/16/23   Representative spoke with at [facility] Agency: [facility]  Social Drivers of Health (SDOH) Interventions SDOH Screenings   Food Insecurity: No Food Insecurity (10/15/2023)  Recent Concern: Food Insecurity - Food Insecurity Present (10/06/2023)  Housing: Low Risk  (10/15/2023)  Recent Concern: Housing - High Risk (08/19/2023)  Transportation Needs: No Transportation Needs (10/15/2023)  Recent Concern: Transportation Needs - Unmet Transportation Needs (08/19/2023)  Utilities: Not At Risk (10/15/2023)  Recent Concern: Utilities - At Risk (08/19/2023)  Financial Resource Strain: Unknown (06/13/2019)  Physical Activity: Unknown (06/13/2019)  Social Connections: Unknown (06/13/2019)  Stress: Unknown (06/13/2019)  Tobacco Use: Medium Risk (10/15/2023)     Readmission Risk Interventions    06/17/2023    4:23 PM 04/29/2022   10:26 AM  03/14/2022   12:48 PM  Readmission Risk Prevention Plan  Transportation Screening Complete Complete Complete  PCP or Specialist Appt within 3-5 Days   Complete  HRI or Home Care Consult Complete Complete Complete  Social Work Consult for Recovery Care Planning/Counseling Complete Complete Complete  Palliative Care Screening Not Applicable Not Applicable Not Applicable  Medication Review Oceanographer) Complete Complete Complete

## 2023-10-22 NOTE — Progress Notes (Signed)
Discharge instructions reviewed with patient including followup visits and current medications.  Understanding was verbalized and all questions were answered.  IV removed without complication; patient tolerated well.  Awaiting taxi for discharge.
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
## 2023-11-18 ENCOUNTER — Ambulatory Visit: Payer: Medicare HMO | Admitting: Family Medicine

## 2023-12-28 ENCOUNTER — Emergency Department
Admission: EM | Admit: 2023-12-28 | Discharge: 2023-12-29 | Disposition: A | Discharge status: Home / Self Care | Payer: Medicare HMO | Attending: Emergency Medicine | Admitting: Emergency Medicine

## 2023-12-28 ENCOUNTER — Other Ambulatory Visit: Payer: Self-pay

## 2023-12-28 DIAGNOSIS — W19XXXA Unspecified fall, initial encounter: Secondary | ICD-10-CM | POA: Insufficient documentation

## 2023-12-28 DIAGNOSIS — Z9181 History of falling: Secondary | ICD-10-CM | POA: Diagnosis not present

## 2023-12-28 DIAGNOSIS — R296 Repeated falls: Secondary | ICD-10-CM

## 2023-12-28 DIAGNOSIS — F1010 Alcohol abuse, uncomplicated: Secondary | ICD-10-CM

## 2023-12-28 DIAGNOSIS — Y908 Blood alcohol level of 240 mg/100 ml or more: Secondary | ICD-10-CM | POA: Insufficient documentation

## 2023-12-28 DIAGNOSIS — F10120 Alcohol abuse with intoxication, uncomplicated: Secondary | ICD-10-CM | POA: Insufficient documentation

## 2023-12-28 DIAGNOSIS — R262 Difficulty in walking, not elsewhere classified: Secondary | ICD-10-CM | POA: Insufficient documentation

## 2023-12-28 DIAGNOSIS — F10920 Alcohol use, unspecified with intoxication, uncomplicated: Secondary | ICD-10-CM

## 2023-12-28 LAB — COMPREHENSIVE METABOLIC PANEL
ALT: 69 U/L — ABNORMAL HIGH (ref 0–44)
AST: 117 U/L — ABNORMAL HIGH (ref 15–41)
Albumin: 3.8 g/dL (ref 3.5–5.0)
Alkaline Phosphatase: 90 U/L (ref 38–126)
Anion gap: 12 (ref 5–15)
BUN: <5 mg/dL — ABNORMAL LOW (ref 8–23)
CO2: 25 mmol/L (ref 22–32)
Calcium: 8.5 mg/dL — ABNORMAL LOW (ref 8.9–10.3)
Chloride: 86 mmol/L — ABNORMAL LOW (ref 98–111)
Creatinine, Ser: 0.31 mg/dL — ABNORMAL LOW (ref 0.61–1.24)
GFR, Estimated: >60 mL/min (ref >60)
Glucose, Bld: 105 mg/dL — ABNORMAL HIGH (ref 70–99)
Potassium: 3.2 mmol/L — ABNORMAL LOW (ref 3.5–5.1)
Sodium: 123 mmol/L — ABNORMAL LOW (ref 135–145)
Total Bilirubin: 1.6 mg/dL — ABNORMAL HIGH (ref 0.0–1.2)
Total Protein: 6.9 g/dL (ref 6.5–8.1)

## 2023-12-28 LAB — CBC WITH DIFFERENTIAL/PLATELET
Abs Immature Granulocytes: 0.02 10*3/uL (ref 0.00–0.07)
Basophils Absolute: 0 10*3/uL (ref 0.0–0.1)
Basophils Relative: 1 %
Eosinophils Absolute: 0.1 10*3/uL (ref 0.0–0.5)
Eosinophils Relative: 1 %
HCT: 37.6 % — ABNORMAL LOW (ref 39.0–52.0)
Hemoglobin: 13.5 g/dL (ref 13.0–17.0)
Immature Granulocytes: 0 %
Lymphocytes Relative: 66 %
Lymphs Abs: 4.5 10*3/uL — ABNORMAL HIGH (ref 0.7–4.0)
MCH: 31.5 pg (ref 26.0–34.0)
MCHC: 35.9 g/dL (ref 30.0–36.0)
MCV: 87.6 fL (ref 80.0–100.0)
Monocytes Absolute: 0.4 10*3/uL (ref 0.1–1.0)
Monocytes Relative: 6 %
Neutro Abs: 1.8 10*3/uL (ref 1.7–7.7)
Neutrophils Relative %: 26 %
Platelets: 144 10*3/uL — ABNORMAL LOW (ref 150–400)
RBC: 4.29 MIL/uL (ref 4.22–5.81)
RDW: 12.4 % (ref 11.5–15.5)
WBC: 6.8 10*3/uL (ref 4.0–10.5)
nRBC: 0 % (ref 0.0–0.2)

## 2023-12-28 LAB — ETHANOL: Alcohol, Ethyl (B): 261 mg/dL — ABNORMAL HIGH (ref <10)

## 2023-12-28 MED ORDER — SODIUM CHLORIDE 0.9 % IV BOLUS
1000.0000 mL | Freq: Once | INTRAVENOUS | Status: AC
  Administered 2023-12-28: 1000 mL via INTRAVENOUS

## 2023-12-28 NOTE — ED Triage Notes (Signed)
 EMS called out to patient's residence for a fall; reports last ETOH this morning.

## 2023-12-28 NOTE — ED Provider Notes (Signed)
 [facility] Provider Note   Event Date/Time   First MD Initiated Contact with Patient 12/28/23 2000     (approximate) History  Alcohol Intoxication  HPI Douglas Peterson. is a 73 y.o. male with a history of alcohol abuse well-known to our emergency department who presents via EMS after alcohol intoxication and a fall.  Patient shows no external signs of trauma.  Further history and review of systems are unable to be obtained at this time secondary to intoxication and altered mental status ROS: Unable to assess   Physical Exam  Triage Vital Signs: ED Triage Vitals  Encounter Vitals Group     BP 12/28/23 1741 (!) 140/83     Systolic BP Percentile --      Diastolic BP Percentile --      Pulse Rate 12/28/23 1741 72     Resp 12/28/23 1741 18     Temp 12/28/23 1741 98.1 F (36.7 C)     Temp Source 12/28/23 1741 Oral     SpO2 12/28/23 1741 96 %     Weight 12/28/23 1741 130 lb (59 kg)     Height 12/28/23 1741 5\' 4"  (1.626 m)     Head Circumference --      Peak Flow --      Pain Score 12/28/23 1741 7     Pain Loc --      Pain Education --      Exclude from Growth Chart --    Most recent vital signs: Vitals:   12/28/23 1741  BP: (!) 140/83  Pulse: 72  Resp: 18  Temp: 98.1 F (36.7 C)  SpO2: 96%   General: Awake, cooperative CV:  Good peripheral perfusion.  Resp:  Normal effort.  Abd:  No distention.  Other:  Elderly well-developed, well-nourished Caucasian male resting comfortably in no acute distress ED Results / Procedures / Treatments  Labs (all labs ordered are listed, but only abnormal results are displayed) Labs Reviewed  CBC WITH DIFFERENTIAL/PLATELET - Abnormal; Notable for the following components:      Result Value   HCT 37.6 (*)    Platelets 144 (*)    Lymphs Abs 4.5 (*)    All other components within normal limits  COMPREHENSIVE METABOLIC PANEL - Abnormal; Notable for the following components:   Sodium 123 (*)    Potassium 3.2  (*)    Chloride 86 (*)    Glucose, Bld 105 (*)    BUN <5 (*)    Creatinine, Ser 0.31 (*)    Calcium 8.5 (*)    AST 117 (*)    ALT 69 (*)    Total Bilirubin 1.6 (*)    All other components within normal limits  ETHANOL - Abnormal; Notable for the following components:   Alcohol, Ethyl (B) 261 (*)    All other components within normal limits   PROCEDURES: Critical Care performed: No Procedures MEDICATIONS ORDERED IN ED: Medications  sodium chloride 0.9 % bolus 1,000 mL (1,000 mLs Intravenous New Bag/Given 12/28/23 2127)   IMPRESSION / MDM / ASSESSMENT AND PLAN / ED COURSE  I reviewed the triage vital signs and the nursing notes.                             The patient is on the cardiac monitor to evaluate for evidence of arrhythmia and/or significant heart rate changes. Patient's presentation is most consistent with acute presentation with  potential threat to life or bodily function. Presents with altered mental status. +Slurred, sluggish behavior. Stated EtOH intoxication. Airway maintained. Unlikely intracranial bleed, opioid intoxication or coingestion, sepsis, hypothyroidism. Suspect likely transient course of intoxication with expected  improvement of symptoms as patient metabolizes offending agent.  Plan: frequent reassessments  Reassessment Note: Time: 4 hours since initial presentation. Evaluation: Frequent mental status exams showed improving symptoms and evidence that the patient's AMS was secondary to intoxication. Pt able to ambulate without difficulty and PO tolerant. Plan DC home with ride and return precautions. Disposition: Discharge home    FINAL CLINICAL IMPRESSION(S) / ED DIAGNOSES   Final diagnoses:  Alcohol abuse  Acute alcoholic intoxication without complication (HCC)  Unwitnessed fall   Rx / DC Orders   ED Discharge Orders     None      Note:  This document was prepared using Dragon voice recognition software and may include unintentional  dictation errors.   Merwyn Katos, MD 12/28/23 [phone]

## 2023-12-28 NOTE — ED Notes (Addendum)
 All three of pts contacts called to see if they could pick up pt after being discharged. Douglas Peterson cannot pick him up. Douglas Peterson and Douglas Peterson were called and were left a voicemail.

## 2023-12-28 NOTE — ED Notes (Signed)
 Pt woke up screaming " HELP" this writer went to check on Pt and he said that he woke and felt wet. This Clinical research associate changed Pt fully . Pt currently sleeping again

## 2023-12-28 NOTE — ED Notes (Signed)
 Pt cleaned up after urinating. New brief and bed pads applied.

## 2023-12-29 MED ORDER — ONDANSETRON 4 MG PO TBDP
4.0000 mg | ORAL_TABLET | Freq: Once | ORAL | Status: AC
  Administered 2023-12-29: 4 mg via ORAL
  Filled 2023-12-29: qty 1

## 2023-12-29 NOTE — ED Notes (Signed)
 Spoke with neighbor on patients phone. Neighbor is on way to pick patient up at this time and should be here around 4.

## 2023-12-29 NOTE — ED Provider Notes (Signed)
 5:18 AM: Asked to reassess [name] he is pending a ride home, he was feeling nauseous.  On exam, he has no tongue fasciculations or tremors.  Will give him some ODT Zofran.  I still believe that he is safe for discharge home.   Claybon Jabs, MD 12/29/23 [phone]

## 2023-12-29 NOTE — ED Notes (Addendum)
 Cordarrius Coad Sister [phone]   Kellie Simmering Daughter Emergency Contact [phone]   (+1) Anibal Henderson Other [phone]     All the above called to inquire about pt ride home, no answer, left VM.

## 2023-12-29 NOTE — ED Notes (Signed)
 Previous Charity fundraiser and the patient states neighbor will be coming to pick patient up.

## 2023-12-29 NOTE — ED Notes (Signed)
 Pt cleaned up after a urine occurrence. New brief and bed pads applied.

## 2023-12-29 NOTE — Evaluation (Signed)
 Physical Therapy Evaluation Patient Details Name: Douglas Peterson. MRN: [id number] DOB: 20-Mar-1951 Today's Date: 12/29/2023  History of Present Illness  [name] a 73 year old male with a history of alcohol abuse well-known to our emergency department who presents via EMS after alcohol intoxication and a fall. History of stroke, ETOH abuse.    Clinical Impression  PT evaluation completed. Discharge home is anticipated today per notes and patient requesting to work on transfers to get out of bed. Patient reports he has recently been Mod I at the wheelchair level at home with minimal walking recently. He also reports having home health. He still lives alone.   Today the patient required assistance for for squat pivot transfer to and from wheelchair. Cues provided for technique to facilitate safety and independence with transfers. The patient also has right side flank pain with movement. He feels he is not at his baseline level of independence. Patient has DME in place at home. Anticipate patient will need initial intermittent assistance at home with mobility. PT will continue to follow to maximize independence and decrease caregiver burden.       If plan is discharge home, recommend the following: A little help with walking and/or transfers;A little help with bathing/dressing/bathroom;Help with stairs or ramp for entrance;Assistance with cooking/housework   Can travel by private vehicle        Equipment Recommendations None recommended by PT  Recommendations for Other Services       Functional Status Assessment Patient has had a recent decline in their functional status and demonstrates the ability to make significant improvements in function in a reasonable and predictable amount of time.     Precautions / Restrictions Precautions Precautions: Fall Restrictions Weight Bearing Restrictions Per Provider Order: No      Mobility  Bed Mobility Overal bed mobility: Needs  Assistance Bed Mobility: Supine to Sit, Sit to Supine     Supine to sit: Contact guard Sit to supine: Contact guard assist   General bed mobility comments: increased time required with right side rib pain reported with movement    Transfers Overall transfer level: Needs assistance Equipment used: None Transfers: Bed to chair/wheelchair/BSC       Squat pivot transfers: Mod assist     General transfer comment: verbal cues for technique. patient is able to manage wheelchair parts independently without cues. squat pivot performed from bed to wheelchair and back to bed    Ambulation/Gait                  Research scientist (physical sciences) Wheelchair mobility: Yes Wheelchair propulsion: Both lower extermities, Left upper extremity Wheelchair parts: Independent Distance: 15 Wheelchair Assistance Details (indicate cue type and reason): patient required intermittent Min A for wheelchair propulsion as he was unable to propel with LE (does not have his shoes)   Tilt Bed    Modified Rankin (Stroke Patients Only)       Balance Overall balance assessment: Needs assistance Sitting-balance support: Feet supported Sitting balance-Leahy Scale: Fair                                       Pertinent Vitals/Pain Pain Assessment Pain Assessment: Faces Faces Pain Scale: Hurts little more Pain Location: R side/ribs with movement Pain Descriptors / Indicators: Discomfort, Grimacing, Guarding Pain Intervention(s): Monitored during session, Repositioned,  Limited activity within patient's tolerance    Home Living Family/patient expects to be discharged to:: Private residence Living Arrangements: Alone Available Help at Discharge: Neighbor;Available PRN/intermittently Type of Home: House Home Access: Stairs to enter Entrance Stairs-Rails: Can reach both Entrance Stairs-Number of Steps: 3   Home Layout: One level Home Equipment:  Cane - single point;Wheelchair - manual Additional Comments: patient reports he was getting home health    Prior Function Prior Level of Function : Independent/Modified Independent;History of Falls (last six months)             Mobility Comments: Mod I for squat pivot transfer to and from wheelchair. has not been recently ambulatory at home but was walking within the past year       Extremity/Trunk Assessment   Upper Extremity Assessment Upper Extremity Assessment: Generalized weakness;RUE deficits/detail RUE Deficits / Details: chronic deficits from prior stroke. hypertonicity, minimal movement, elbow flexed    Lower Extremity Assessment Lower Extremity Assessment: Generalized weakness;RLE deficits/detail RLE Deficits / Details: chronic weakness from prior stroke       Communication   Communication Communication: Impaired Factors Affecting Communication: Difficulty expressing self (low tone)    Cognition Arousal: Alert Behavior During Therapy: WFL for tasks assessed/performed   PT - Cognitive impairments: Awareness, Safety/Judgement                         Following commands: Intact       Cueing Cueing Techniques: Verbal cues     General Comments General comments (skin integrity, edema, etc.): patient does not feel he is at his baseline level of functional independence    Exercises     Assessment/Plan    PT Assessment Patient needs continued PT services  PT Problem List Decreased strength;Decreased range of motion;Decreased activity tolerance;Decreased balance;Decreased mobility;Decreased safety awareness       PT Treatment Interventions DME instruction;Gait training;Stair training;Functional mobility training;Therapeutic activities;Therapeutic exercise;Balance training;Neuromuscular re-education;Cognitive remediation;Patient/family education;Wheelchair mobility training    PT Goals (Current goals can be found in the Care Plan section)  Acute  Rehab PT Goals Patient Stated Goal: to have more time before going home PT Goal Formulation: With patient Time For Goal Achievement: 01/12/24 Potential to Achieve Goals: Fair Additional Goals Additional Goal #1: patient will propel wheelchair 114ft with supervision in preparation for home mobility    Frequency Min 1X/week     Co-evaluation               AM-PAC PT "6 Clicks" Mobility  Outcome Measure Help needed turning from your back to your side while in a flat bed without using bedrails?: A Little Help needed moving from lying on your back to sitting on the side of a flat bed without using bedrails?: A Little Help needed moving to and from a bed to a chair (including a wheelchair)?: A Lot Help needed standing up from a chair using your arms (e.g., wheelchair or bedside chair)?: A Lot Help needed to walk in hospital room?: A Lot Help needed climbing 3-5 steps with a railing? : Total 6 Click Score: 13    End of Session   Activity Tolerance: Patient limited by fatigue Patient left: in bed;with call bell/phone within reach   PT Visit Diagnosis: Difficulty in walking, not elsewhere classified (R26.2);History of falling (Z91.81)    Time: 1610-9604 PT Time Calculation (min) (ACUTE ONLY): 21 min   Charges:   PT Evaluation $PT Eval Low Complexity: 1 Low PT Treatments $Therapeutic Activity:  8-22 mins PT General Charges $$ ACUTE PT VISIT: 1 Visit         Donna Bernard, PT, MPT   [name] 12/29/2023, 12:05 PM

## 2023-12-29 NOTE — ED Notes (Signed)
 Hospital meal provided.  100% consumed, pt tolerated w/o complaints.  Waste discarded appropriately.

## 2023-12-29 NOTE — TOC Transition Note (Signed)
 Transition of Care [facility]) - Discharge Note   Patient Details  Name: Douglas Peterson. MRN: [id number] Date of Birth: 06/16/1951  Transition of Care [facility]) CM/SW Contact:  Margarito Liner, LCSW Phone Number: 12/29/2023, 2:58 PM   Clinical Narrative: Patient was set up with [facility] in December but they never started services. Liaison is unsure why. They can accept him again and will set him up with a PCP. [name] aware and agreeable to this plan. No further concerns. His friend will pick him up around 4:00. CSW signing off.    Final next level of care: Home w Home Health Services Barriers to Discharge: No Barriers Identified   Patient Goals and CMS Choice     Choice offered to / list presented to : Patient      Discharge Placement                Patient to be transferred to facility by: Friend   Patient and family notified of of transfer: 12/29/23  Discharge Plan and Services Additional resources added to the After Visit Summary for                            [facility] Arranged: PT [facility] Agency: [employer] [facility] Date [facility] Agency Contacted: 12/29/23   Representative spoke with at [facility] Agency: Elnita Maxwell  Social Drivers of Health (SDOH) Interventions SDOH Screenings   Food Insecurity: No Food Insecurity (10/15/2023)  Recent Concern: Food Insecurity - Food Insecurity Present (10/06/2023)  Housing: Low Risk  (10/15/2023)  Recent Concern: Housing - High Risk (08/19/2023)  Transportation Needs: No Transportation Needs (10/15/2023)  Recent Concern: Transportation Needs - Unmet Transportation Needs (08/19/2023)  Utilities: Not At Risk (10/15/2023)  Recent Concern: Utilities - At Risk (08/19/2023)  Financial Resource Strain: Unknown (06/13/2019)  Physical Activity: Unknown (06/13/2019)  Social Connections: Unknown (06/13/2019)  Stress: Unknown (06/13/2019)  Tobacco Use: Medium Risk (12/28/2023)     Readmission Risk Interventions    06/17/2023    4:23 PM  04/29/2022   10:26 AM 03/14/2022   12:48 PM  Readmission Risk Prevention Plan  Transportation Screening Complete Complete Complete  PCP or Specialist Appt within 3-5 Days   Complete  HRI or Home Care Consult Complete Complete Complete  Social Work Consult for Recovery Care Planning/Counseling Complete Complete Complete  Palliative Care Screening Not Applicable Not Applicable Not Applicable  Medication Review Oceanographer) Complete Complete Complete

## 2023-12-29 NOTE — ED Notes (Signed)
 Patient brief changed.

## 2023-12-29 NOTE — ED Notes (Signed)
 Friend will arrive to pick up patient aroudn 2PM today.

## 2023-12-29 NOTE — ED Notes (Signed)
 Pt able to feed self, urinate in urinal and make phone calls independently. He currently manages his own medications at home. States he has a wheelchair at home that he has been using. Friend states he will pick up patient at [facility].

## 2023-12-29 NOTE — ED Notes (Signed)
(+  1) Douglas Peterson Other [phone]  Returned phone call, states that he no longer cares for patient and was a previous home care assistant to patient. Will not pick patient up and wishes to be removed from pt emergency contacts as "it is not appropriate". Pt made aware.

## 2024-01-07 ENCOUNTER — Emergency Department

## 2024-01-07 ENCOUNTER — Inpatient Hospital Stay
Admission: EM | Admit: 2024-01-07 | Discharge: 2024-01-13 | DRG: 896 | Disposition: A | Discharge status: Skilled Nursing Facility | Attending: Internal Medicine | Admitting: Internal Medicine

## 2024-01-07 ENCOUNTER — Inpatient Hospital Stay

## 2024-01-07 ENCOUNTER — Other Ambulatory Visit: Payer: Self-pay

## 2024-01-07 ENCOUNTER — Encounter: Payer: Self-pay | Admitting: Emergency Medicine

## 2024-01-07 DIAGNOSIS — L89312 Pressure ulcer of right buttock, stage 2: Secondary | ICD-10-CM | POA: Diagnosis present

## 2024-01-07 DIAGNOSIS — F1010 Alcohol abuse, uncomplicated: Secondary | ICD-10-CM | POA: Diagnosis not present

## 2024-01-07 DIAGNOSIS — Z66 Do not resuscitate: Secondary | ICD-10-CM | POA: Diagnosis present

## 2024-01-07 DIAGNOSIS — Z681 Body mass index (BMI) 19 or less, adult: Secondary | ICD-10-CM

## 2024-01-07 DIAGNOSIS — E785 Hyperlipidemia, unspecified: Secondary | ICD-10-CM | POA: Diagnosis present

## 2024-01-07 DIAGNOSIS — G9341 Metabolic encephalopathy: Secondary | ICD-10-CM | POA: Diagnosis present

## 2024-01-07 DIAGNOSIS — Z8249 Family history of ischemic heart disease and other diseases of the circulatory system: Secondary | ICD-10-CM

## 2024-01-07 DIAGNOSIS — R262 Difficulty in walking, not elsewhere classified: Secondary | ICD-10-CM | POA: Diagnosis present

## 2024-01-07 DIAGNOSIS — I10 Essential (primary) hypertension: Secondary | ICD-10-CM | POA: Diagnosis present

## 2024-01-07 DIAGNOSIS — Z7189 Other specified counseling: Secondary | ICD-10-CM | POA: Diagnosis not present

## 2024-01-07 DIAGNOSIS — R1312 Dysphagia, oropharyngeal phase: Secondary | ICD-10-CM | POA: Diagnosis present

## 2024-01-07 DIAGNOSIS — I480 Paroxysmal atrial fibrillation: Secondary | ICD-10-CM | POA: Diagnosis present

## 2024-01-07 DIAGNOSIS — F10929 Alcohol use, unspecified with intoxication, unspecified: Secondary | ICD-10-CM | POA: Diagnosis present

## 2024-01-07 DIAGNOSIS — F10220 Alcohol dependence with intoxication, uncomplicated: Secondary | ICD-10-CM | POA: Diagnosis present

## 2024-01-07 DIAGNOSIS — R54 Age-related physical debility: Secondary | ICD-10-CM | POA: Diagnosis present

## 2024-01-07 DIAGNOSIS — R531 Weakness: Secondary | ICD-10-CM | POA: Diagnosis present

## 2024-01-07 DIAGNOSIS — Z87820 Personal history of traumatic brain injury: Secondary | ICD-10-CM

## 2024-01-07 DIAGNOSIS — F10239 Alcohol dependence with withdrawal, unspecified: Secondary | ICD-10-CM | POA: Diagnosis present

## 2024-01-07 DIAGNOSIS — F1020 Alcohol dependence, uncomplicated: Secondary | ICD-10-CM | POA: Diagnosis present

## 2024-01-07 DIAGNOSIS — Y908 Blood alcohol level of 240 mg/100 ml or more: Secondary | ICD-10-CM | POA: Diagnosis present

## 2024-01-07 DIAGNOSIS — I2510 Atherosclerotic heart disease of native coronary artery without angina pectoris: Secondary | ICD-10-CM | POA: Diagnosis present

## 2024-01-07 DIAGNOSIS — Z87891 Personal history of nicotine dependence: Secondary | ICD-10-CM

## 2024-01-07 DIAGNOSIS — Z79899 Other long term (current) drug therapy: Secondary | ICD-10-CM

## 2024-01-07 DIAGNOSIS — E43 Unspecified severe protein-calorie malnutrition: Secondary | ICD-10-CM | POA: Diagnosis present

## 2024-01-07 DIAGNOSIS — Z888 Allergy status to other drugs, medicaments and biological substances status: Secondary | ICD-10-CM | POA: Diagnosis not present

## 2024-01-07 DIAGNOSIS — R1310 Dysphagia, unspecified: Secondary | ICD-10-CM

## 2024-01-07 DIAGNOSIS — Z515 Encounter for palliative care: Secondary | ICD-10-CM | POA: Diagnosis not present

## 2024-01-07 DIAGNOSIS — K7010 Alcoholic hepatitis without ascites: Secondary | ICD-10-CM | POA: Diagnosis present

## 2024-01-07 DIAGNOSIS — F10920 Alcohol use, unspecified with intoxication, uncomplicated: Principal | ICD-10-CM

## 2024-01-07 DIAGNOSIS — E871 Hypo-osmolality and hyponatremia: Secondary | ICD-10-CM | POA: Diagnosis present

## 2024-01-07 LAB — COMPREHENSIVE METABOLIC PANEL
ALT: 93 U/L — ABNORMAL HIGH (ref 0–44)
AST: 152 U/L — ABNORMAL HIGH (ref 15–41)
Albumin: 4.4 g/dL (ref 3.5–5.0)
Alkaline Phosphatase: 145 U/L — ABNORMAL HIGH (ref 38–126)
Anion gap: 13 (ref 5–15)
BUN: <5 mg/dL — ABNORMAL LOW (ref 8–23)
CO2: 24 mmol/L (ref 22–32)
Calcium: 8.6 mg/dL — ABNORMAL LOW (ref 8.9–10.3)
Chloride: 86 mmol/L — ABNORMAL LOW (ref 98–111)
Creatinine, Ser: 0.49 mg/dL — ABNORMAL LOW (ref 0.61–1.24)
GFR, Estimated: >60 mL/min (ref >60)
Glucose, Bld: 87 mg/dL (ref 70–99)
Potassium: 4.1 mmol/L (ref 3.5–5.1)
Sodium: 123 mmol/L — ABNORMAL LOW (ref 135–145)
Total Bilirubin: 1.5 mg/dL — ABNORMAL HIGH (ref 0.0–1.2)
Total Protein: 8 g/dL (ref 6.5–8.1)

## 2024-01-07 LAB — CBC WITH DIFFERENTIAL/PLATELET
Abs Immature Granulocytes: 0.01 10*3/uL (ref 0.00–0.07)
Basophils Absolute: 0.1 10*3/uL (ref 0.0–0.1)
Basophils Relative: 1 %
Eosinophils Absolute: 0 10*3/uL (ref 0.0–0.5)
Eosinophils Relative: 1 %
HCT: 45.9 % (ref 39.0–52.0)
Hemoglobin: 16.3 g/dL (ref 13.0–17.0)
Immature Granulocytes: 0 %
Lymphocytes Relative: 66 %
Lymphs Abs: 3.6 10*3/uL (ref 0.7–4.0)
MCH: 31.2 pg (ref 26.0–34.0)
MCHC: 35.5 g/dL (ref 30.0–36.0)
MCV: 87.9 fL (ref 80.0–100.0)
Monocytes Absolute: 0.3 10*3/uL (ref 0.1–1.0)
Monocytes Relative: 6 %
Neutro Abs: 1.4 10*3/uL — ABNORMAL LOW (ref 1.7–7.7)
Neutrophils Relative %: 26 %
Platelets: 185 10*3/uL (ref 150–400)
RBC: 5.22 MIL/uL (ref 4.22–5.81)
RDW: 13 % (ref 11.5–15.5)
WBC: 5.4 10*3/uL (ref 4.0–10.5)
nRBC: 0 % (ref 0.0–0.2)

## 2024-01-07 LAB — GLUCOSE, CAPILLARY: Glucose-Capillary: 87 mg/dL (ref 70–99)

## 2024-01-07 LAB — SODIUM
Sodium: 135 mmol/L (ref 135–145)
Sodium: 131 mmol/L — ABNORMAL LOW (ref 135–145)

## 2024-01-07 LAB — ETHANOL: Alcohol, Ethyl (B): 264 mg/dL — ABNORMAL HIGH (ref <10)

## 2024-01-07 LAB — TROPONIN I (HIGH SENSITIVITY): Troponin I (High Sensitivity): 7 ng/L (ref <18)

## 2024-01-07 MED ORDER — ACETAMINOPHEN 650 MG RE SUPP: 650.0000 mg | Freq: Four times a day (QID) | RECTAL | Status: DC | PRN

## 2024-01-07 MED ORDER — SUCRALFATE 1 G PO TABS
1.0000 g | ORAL_TABLET | Freq: Three times a day (TID) | ORAL | Status: DC
  Administered 2024-01-08 – 2024-01-13 (×22): 1 g via ORAL
  Filled 2024-01-07 (×23): qty 1

## 2024-01-07 MED ORDER — ADULT MULTIVITAMIN W/MINERALS CH
1.0000 | ORAL_TABLET | Freq: Every day | ORAL | Status: DC
  Administered 2024-01-07 – 2024-01-13 (×6): 1 via ORAL
  Filled 2024-01-07 (×6): qty 1

## 2024-01-07 MED ORDER — ATORVASTATIN CALCIUM 20 MG PO TABS
10.0000 mg | ORAL_TABLET | Freq: Every day | ORAL | Status: DC
  Administered 2024-01-07: 10 mg via ORAL
  Filled 2024-01-07: qty 1

## 2024-01-07 MED ORDER — ONDANSETRON HCL 4 MG/2ML IJ SOLN
4.0000 mg | Freq: Four times a day (QID) | INTRAMUSCULAR | Status: DC | PRN
  Administered 2024-01-07 – 2024-01-11 (×3): 4 mg via INTRAVENOUS
  Filled 2024-01-07 (×3): qty 2

## 2024-01-07 MED ORDER — LORAZEPAM 1 MG PO TABS
1.0000 mg | ORAL_TABLET | ORAL | Status: AC | PRN
  Administered 2024-01-07: 2 mg via ORAL
  Administered 2024-01-07 – 2024-01-09 (×5): 1 mg via ORAL
  Filled 2024-01-07 (×5): qty 1
  Filled 2024-01-07: qty 2

## 2024-01-07 MED ORDER — SODIUM CHLORIDE 0.9 % IV SOLN: INTRAVENOUS | Status: DC

## 2024-01-07 MED ORDER — ENOXAPARIN SODIUM 30 MG/0.3ML IJ SOSY
30.0000 mg | PREFILLED_SYRINGE | INTRAMUSCULAR | Status: DC
  Administered 2024-01-08 – 2024-01-13 (×6): 30 mg via SUBCUTANEOUS
  Filled 2024-01-07 (×6): qty 0.3

## 2024-01-07 MED ORDER — ACETAMINOPHEN 325 MG PO TABS
650.0000 mg | ORAL_TABLET | Freq: Four times a day (QID) | ORAL | Status: DC | PRN
  Administered 2024-01-11 (×2): 650 mg via ORAL
  Filled 2024-01-07 (×2): qty 2

## 2024-01-07 MED ORDER — ONDANSETRON HCL 4 MG PO TABS
4.0000 mg | ORAL_TABLET | Freq: Four times a day (QID) | ORAL | Status: DC | PRN
  Administered 2024-01-12: 4 mg via ORAL
  Filled 2024-01-07: qty 1

## 2024-01-07 MED ORDER — LORAZEPAM 2 MG/ML IJ SOLN: 1.0000 mg | INTRAMUSCULAR | Status: AC | PRN

## 2024-01-07 MED ORDER — TRAZODONE HCL 50 MG PO TABS
25.0000 mg | ORAL_TABLET | Freq: Every evening | ORAL | Status: DC | PRN
  Administered 2024-01-09 – 2024-01-13 (×3): 25 mg via ORAL
  Filled 2024-01-07 (×3): qty 1

## 2024-01-07 MED ORDER — MELATONIN 5 MG PO TABS
5.0000 mg | ORAL_TABLET | Freq: Every day | ORAL | Status: DC
  Administered 2024-01-08 – 2024-01-12 (×6): 5 mg via ORAL
  Filled 2024-01-07 (×6): qty 1

## 2024-01-07 MED ORDER — MAGNESIUM HYDROXIDE 400 MG/5ML PO SUSP
30.0000 mL | Freq: Every day | ORAL | Status: DC | PRN
  Administered 2024-01-11 – 2024-01-13 (×2): 30 mL via ORAL
  Filled 2024-01-07 (×2): qty 30

## 2024-01-07 MED ORDER — POLYETHYLENE GLYCOL 3350 17 G PO PACK: 17.0000 g | PACK | Freq: Every day | ORAL | Status: DC | PRN

## 2024-01-07 MED ORDER — THIAMINE HCL 100 MG/ML IJ SOLN
100.0000 mg | Freq: Every day | INTRAMUSCULAR | Status: DC
Start: 2024-01-07 — End: 2024-01-14
  Administered 2024-01-08: 100 mg via INTRAVENOUS
  Filled 2024-01-07 (×4): qty 2

## 2024-01-07 MED ORDER — ENSURE ENLIVE PO LIQD
237.0000 mL | Freq: Two times a day (BID) | ORAL | Status: DC
  Administered 2024-01-08 – 2024-01-13 (×6): 237 mL via ORAL

## 2024-01-07 MED ORDER — BACLOFEN 10 MG PO TABS
5.0000 mg | ORAL_TABLET | Freq: Three times a day (TID) | ORAL | Status: DC
Start: 2024-01-07 — End: 2024-01-14
  Administered 2024-01-07 – 2024-01-13 (×18): 5 mg via ORAL
  Filled 2024-01-07 (×18): qty 1

## 2024-01-07 MED ORDER — THIAMINE MONONITRATE 100 MG PO TABS
100.0000 mg | ORAL_TABLET | Freq: Every day | ORAL | Status: DC
  Administered 2024-01-07 – 2024-01-13 (×6): 100 mg via ORAL
  Filled 2024-01-07 (×6): qty 1

## 2024-01-07 MED ORDER — ATENOLOL 25 MG PO TABS
12.5000 mg | ORAL_TABLET | Freq: Every day | ORAL | Status: DC
  Administered 2024-01-07 – 2024-01-13 (×7): 12.5 mg via ORAL
  Filled 2024-01-07: qty 0.5
  Filled 2024-01-07: qty 1
  Filled 2024-01-07 (×5): qty 0.5

## 2024-01-07 MED ORDER — ENOXAPARIN SODIUM 40 MG/0.4ML IJ SOSY
40.0000 mg | PREFILLED_SYRINGE | INTRAMUSCULAR | Status: DC
  Administered 2024-01-07: 40 mg via SUBCUTANEOUS
  Filled 2024-01-07: qty 0.4

## 2024-01-07 MED ORDER — AMLODIPINE BESYLATE 5 MG PO TABS
5.0000 mg | ORAL_TABLET | Freq: Every day | ORAL | Status: DC
  Administered 2024-01-07 – 2024-01-13 (×7): 5 mg via ORAL
  Filled 2024-01-07 (×7): qty 1

## 2024-01-07 MED ORDER — SODIUM CHLORIDE 0.9 % IV BOLUS
1000.0000 mL | Freq: Once | INTRAVENOUS | Status: AC
  Administered 2024-01-07: 1000 mL via INTRAVENOUS

## 2024-01-07 MED ORDER — PANTOPRAZOLE SODIUM 40 MG PO TBEC
40.0000 mg | DELAYED_RELEASE_TABLET | Freq: Every day | ORAL | Status: DC
  Administered 2024-01-07 – 2024-01-13 (×7): 40 mg via ORAL
  Filled 2024-01-07 (×7): qty 1

## 2024-01-07 MED ORDER — FOLIC ACID 1 MG PO TABS
1.0000 mg | ORAL_TABLET | Freq: Every day | ORAL | Status: DC
  Administered 2024-01-07 – 2024-01-13 (×6): 1 mg via ORAL
  Filled 2024-01-07 (×6): qty 1

## 2024-01-07 NOTE — Progress Notes (Signed)
 D/W with sister. She told me that there is [employer] social worker Maryruth Eve [phone] has been involved in patient's dispo. Sister is concerned with patient going back to home with "non-stop drinking".   Speech also found patient can barely swallow any food and he is made NPO for significantly increased aspiration risk, barium swallow for tomorrow.

## 2024-01-07 NOTE — ED Notes (Signed)
 RN contacted [facility] to confirm cardiac monitoring. [facility] staff reported that pt does not have order for cardiac monitoring. RN sent secure chat to J. Mansy, MD to requesting specific order for monitoring if desired for patient. Pt currently up for admission level of care ready telemetry.

## 2024-01-07 NOTE — ED Notes (Addendum)
 Atenolol is apparently taken differently so that has been noted in PTA meds and Norvasc was something pt says he has not gotten filled in a long time but wants to take as it was never technically dc. Will notify MD once RN can find who is on as no one is currently assigned in secure chat.
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
## 2024-01-07 NOTE — Discharge Instructions (Signed)
Intensive Outpatient Programs   [facility] [address] [place],  [place], [place] [phone]  [facility] (Inpatient and [phone] (Suboxone and Methadone) [address] [phone]  ADS: Alcohol & Drug [facility] - Intensive Outpatient [address] [place], [place] [address], [place]  [phone]  [facility] Margo Aye (Outpatient, Inpatient, Chemical [facility] (Groups and Residental) (insurance only) [phone] [place], [place] [phone]   [facility] (for caregivers and family) [address] Laurell Josephs [address] [place], [place] [phone]  Residential Treatment Programs  [facility](2 years) Residential: 28 days)[facility] ([facility] [address] [place], [place], [place] [phone] or [phone]  [facility] Treatment [facility] [address] [place], [place], [place] [phone]  [facility] Residential Treatment Facility[facility]idential Treatment Services (RTS) [address] [place], [place], [place] [phone] Admissions: 8am-3pm M-F  [facility] Program: Residential Program [phone] Days)             [facility]: [facility]  [place], [place], [place]  [phone] or [phone] in Hours over the weekend or by referral)  [facility] [address] [place], [place] [address] [phone] (Do virtual or phone assessment, offer transportation within 25 miles, have in patient and Outpatient options)   [facility] Crisis: [facility]:[phone] (for crisis  response 24 hours a day)      Addiction and Co-Occurring Disorders   [facility], Intensive Outpatient Program is held three afternoons from 1 to 4pm; Monday, Wednesday and Friday allowing patients the opportunity to participate in a treatment program in the day while attending A meetings in the evening. W e provide educational lectures and films, group therapy, individual counseling and family counseling.  A multidisciplinary team of the [facility] healthcare professionals, headed by the Medical Director, designs an individualized treatment program to suit the needs of each patient. The length of stay in this program is determined by the goals and objectives set by the treatment team, but usually is 6 to 10 weeks. Direct admission to IOP is possible after an individual evaluation by our assessment team. Chemical usage patterns, the progressionof disease in the individual, the possible need for detoxification and the availability of an external support system are factors in determining who is appropriate for. direct OP admission. Each person has the opportunity to embrace a lifestyle of on-going recovery, family involvement and attending a 12 Step Support group. Program Objectives Include  Chemicaldependency education Abstinence for all mind-altering chemicals 12 Step support Relapse Prevention Planning Improved communication skills Spirituality and personal fulfilment Mondav-Wednesday-Friday 1:00pm to 4:00pm Individual and Family Sessions are set by the Counselor ANN EVANS, LCAS, NCC, MA Licensed Clinical Addictions Spcialist [phone]    [facility] Chemical Depencency Intensive OutpatientPrograms Alcohol & Drug Services (ADS) [address] [place] [address] [place]: [phone] [place]: [phone] : [phone] [place]: [phone] Both daytime & evening counseling available Accepts Medicaid and other insurances  [facility] [phone] Meets four times a week for 2 1?2 -3 hours each session Both daytime & evening counseling available Accepts Medicaid and other insurances  [facility] Health CD-IOP - Outpatient Services [address] [place] [address] [phone] Meets 3 days a week for 4 hours Accepts Medicare and  other insurances (not Medicaid)  [employer] [facility] [address] [place] [address] [phone]  These referrals have been provided to you as appropriate for your clinical needs while taking into account your financial concerns. Please be aware that agencies, practitioners and insurance companies sometimes change contracts. When calling to make an appointment have your insurance information available so the professional you aregoing to see can confirm whether they are covered by your plan. Take this form with you in case the person you are seeing needs a copy or to contact us. Assessment Counselor   [facility] Outpatient Programs [facility] Health Outpatient Chemical Dependence Intensive Outpatient Program [address] [place], [place] [address] [phone] Private insurance, Medicare A&B, and [facility] ADS (Alcohol and Drug Services) [address], [place], [place] [address] [phone] Medicaid, Self Pay  [facility] [address] Jamey Ripa [phone] Private Insurance, Self Pay  [facility] Partial Hospitalization Program [address] [place], [place] [address] [phone] Private Insurance, Medicaid-[place] and Loann Quill only for partial [facility] [address] [place], [place] [phone] Private Insurance and Self Pay  [facility] 3 7 1  [facility] [address] [place], [place] [phone] Private Insurance, and Self Pay.  Theatre stage manager (Partial Hospitalization) [address] [place] [address] [phone] [facility] Outpatient [address] [employer] [address] [employer], [place], and Self Pay  [facility]: [facility]one Clinic [address] [place], [place], [place] [address] [phone] [address] [address] [place], [place] [address] Phone: [phone] or [phone] [facility] Services [address] [place], [place] [address] [phone] Medicaid, private insurance [facility] [address] [place], [place] [address] [phone] or [phone] Private Insurance Only  [facility] [address] [phone] Self Pay only, sliding scale  [facility] [address] [place], [place] [address] [phone] State funds for uninsured [facility]. [place], [place] [phone] Private Insurance, and Self pay [employer], some Medicaid Crisis Mobile: [facility]: [phone] (for crisis response 24 hours a day) [facility] [phone]   [facility] Lear Ng [phone] Private Insurance, and [facility]  [facility] [address] [place], [place] [phone] Private Insurance, and [place]. [facility]: [phone] ASAP: Adolescent Substance Abuse Program Males ages: 12-17 ASAP: Adolescent Substance abuse Program Females: 12-17 [employer] Structured Day Program [phone] CrisisMobile: [facility]: [phone] (for crisis response 24 hours a day) [facility] [phone]

## 2024-01-07 NOTE — ED Notes (Signed)
 Pt reports he had been heaving but also seemed confused at what that means. He had his finger in his mouth as if to stimulate vomiting upon entering with small bites of food eaten which he has been able to keep down so far. PO being given due to successful PO intake.

## 2024-01-07 NOTE — TOC Initial Note (Signed)
 Transition of Care (TOC) - Initial/Assessment Note    Patient Details  Name: Douglas Peterson. MRN: [id number] Date of Birth: [birth year]  Transition of Care [facility]) CM/SW Contact:    Colin Broach, LCSW Phone Number: 01/07/2024, 11:36 AM  Clinical Narrative:                 CM met with patient to complete readmission screening and initial assessment.  Patient insurance and demographic information verified.  Patient recently discharged with Home Health ([facility]).  They haven't been able to work with patient in the home due to setting up patient's PCP.  They will follow once discharged.  Patient has no concerns about paying for meds.  He states that he will be switching his pharmacy to [facility].  Patient lives alone and has the following DME:  shower chair, wheelchair, and cane.  At discharge, patient states that he may take a cab home.  CSW offered SA resources, however, patient declined.  CSW to continue to follow for discharge needs.  Expected Discharge Plan: Home w Home Health Services Barriers to Discharge: Continued Medical Work up   Patient Goals and CMS Choice            Expected Discharge Plan and Services     Post Acute Care Choice: Home Health Living arrangements for the past 2 months: Single Family Home                           HH Arranged: PT HH Agency: [employer] [facility] Date [facility] Agency Contacted: 01/07/24   Representative spoke with at [facility] Agency: Becky Sax  Prior Living Arrangements/Services Living arrangements for the past 2 months: Single Family Home Lives with:: Self Patient language and need for interpreter reviewed:: Yes Do you feel safe going back to the place where you live?: Yes      Need for Family Participation in Patient Care: Yes (Comment)   Current home services: DME Criminal Activity/Legal Involvement Pertinent to Current Situation/Hospitalization: No - Comment as needed  Activities of Daily Living   ADL Screening (condition at  time of admission) Independently performs ADLs?: No Does the patient have a NEW difficulty with bathing/dressing/toileting/self-feeding that is expected to last >3 days?: No Does the patient have a NEW difficulty with getting in/out of bed, walking, or climbing stairs that is expected to last >3 days?: No Does the patient have a NEW difficulty with communication that is expected to last >3 days?: No Is the patient deaf or have difficulty hearing?: No Does the patient have difficulty seeing, even when wearing glasses/contacts?: No Does the patient have difficulty concentrating, remembering, or making decisions?: Yes  Permission Sought/Granted                  Emotional Assessment Appearance:: Appears stated age Attitude/Demeanor/Rapport: Gracious Affect (typically observed): Pleasant, Appropriate Orientation: : Oriented to Self, Oriented to Place, Oriented to  Time, Oriented to Situation   Psych Involvement: No (comment)  Admission diagnosis:  Hyponatremia [E87.1] Patient Active Problem List   Diagnosis Date Noted   Intractable nausea and vomiting 10/06/2023   Protein-calorie malnutrition, severe 08/20/2023   Ground-level fall 08/16/2023   GERD without esophagitis 08/06/2023   Nausea 07/19/2023   Pressure injury of skin 07/11/2023   Alcohol abuse 07/11/2023   Abdominal pain 06/16/2023   Elevated lactic acid level 06/16/2023   Chronic diastolic CHF (congestive heart failure) (HCC) 06/16/2023   Increased urinary frequency 06/08/2023  Diarrhea 06/08/2023   Upper respiratory infection 06/04/2023   Enteritis 06/02/2023   Polyuria 06/02/2023   Dyslipidemia 05/01/2023   Alcohol dependence (HCC) 05/01/2023   Maggot infestation feet 05/11/2022   Unable to care for self 05/11/2022   Debility 04/25/2022   Weakness 04/23/2022   Epistaxis 04/23/2022   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   Generalized weakness 03/18/2022   Nondisplaced fracture of shaft of left clavicle,  initial encounter for closed fracture 03/18/2022   BPH (benign prostatic hyperplasia) 03/18/2022   Clavicle fracture 03/17/2022   Fall 03/17/2022   CAP (community acquired pneumonia) 03/13/2022   Altered mental status, unspecified 02/20/2022   Hypotension 02/19/2022   Dysphonia 08/13/2021   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   Paresthesia of right upper extremity 08/13/2021   SIRS (systemic inflammatory response syndrome) (HCC) 08/05/2020   GERD (gastroesophageal reflux disease) 06/05/2020   Alcohol use disorder, severe, dependence (HCC) 05/28/2020   Elevated LFTs 04/06/2020   Paroxysmal A-fib (HCC) 03/17/2020   Primary insomnia 02/06/2020   Left inguinal hernia 01/31/2020   Encounter for competency evaluation    Depression 01/17/2020   Pancytopenia (HCC)    Hypomagnesemia    Alcoholic cirrhosis of liver without ascites (HCC)    ETOH abuse    Alcohol intoxication (HCC)    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) 09/27/2019   Hypokalemia 09/27/2019   Alcohol withdrawal delirium, acute, hyperactive (HCC) 09/21/2019   Pressure injury of ankle, stage 1 08/15/2019   Palliative care by specialist    Closed fracture of right proximal humerus 03/19/2019   Vitamin D deficiency 01/11/2019   Osteoporosis 12/22/2018   Closed nondisplaced fracture of acromial process of right scapula with routine healing 11/15/2018   Compression fracture of L2 vertebra with routine healing 11/15/2018   Delirium tremens (HCC) 03/08/2018   Essential hypertension 09/16/2017   Encephalopathy, 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   Steatohepatitis 12/03/2016   Abnormal liver enzymes 06/17/2016   Hereditary hemochromatosis (HCC) 06/17/2016   Anxiety 07/16/2015   Hyponatremia 07/09/2015   H/O traumatic brain injury 05/22/2014   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HCC) 05/22/2014   PCP:  Pcp, No Pharmacy:   [facility] [place] [place] - [address] [place] [address] Phone:  [phone] Fax: [phone]  [facility] [address] [place] [address] Phone: [phone] Fax: [phone]     Social Drivers of Health (SDOH) Social History: SDOH Screenings   Food Insecurity: No Food Insecurity (01/07/2024)  Housing: Low Risk  (01/07/2024)  Transportation Needs: No Transportation Needs (01/07/2024)  Utilities: Not At Risk (01/07/2024)  Financial Resource Strain: Unknown (06/13/2019)  Physical Activity: Unknown (06/13/2019)  Social Connections: Socially Isolated (01/07/2024)  Stress: Unknown (06/13/2019)  Tobacco Use: Medium Risk (01/07/2024)   SDOH Interventions:     Readmission Risk Interventions    01/07/2024   11:31 AM 06/17/2023    4:23 PM 04/29/2022   10:26 AM  Readmission Risk Prevention Plan  Transportation Screening Complete Complete Complete  HRI or Home Care Consult  Complete Complete  Social Work Consult for Recovery Care Planning/Counseling  Complete Complete  Palliative Care Screening  Not Applicable Not Applicable  Medication Review Oceanographer) Complete Complete Complete  PCP or Specialist appointment within 3-5 days of discharge Complete    HRI or Home Care Consult Complete    SW Recovery Care/Counseling Consult Complete    Palliative Care Screening Not Applicable    Skilled  Nursing Facility Not Applicable

## 2024-01-07 NOTE — Progress Notes (Signed)
 PHARMACIST - PHYSICIAN COMMUNICATION  CONCERNING:  Enoxaparin (Lovenox) for DVT Prophylaxis    RECOMMENDATION: Patient was prescribed enoxaprin 40mg  q24 hours for VTE prophylaxis.   Filed Weights   01/07/24 0244  Weight: 43.1 kg (95 lb)    Body mass index is 16.31 kg/m.  Estimated Creatinine Clearance: 50.9 mL/min (A) (by C-G formula based on SCr of 0.49 mg/dL (L)).   [name] candidate for enoxaparin 30mg  every 24 hours based on CrCl <21ml/min or Weight <45kg  DESCRIPTION: Pharmacy has adjusted enoxaparin dose per [facility] policy.  [name] now receiving enoxaparin 30 mg every 24 hours    Merryl Hacker, PharmD Clinical Pharmacist  01/07/2024 2:05 PM

## 2024-01-07 NOTE — ED Notes (Signed)
 CCMD contacted to confirm telemetry monitoring.

## 2024-01-07 NOTE — Progress Notes (Signed)
 SLP Cancellation Note  Patient Details Name: Douglas Peterson. MRN: [id number] DOB: 1951-02-08   Cancelled treatment:       Reason Eval/Treat Not Completed: Other (comment)  Pt with recent significant history of severe oropharyngeal dysphagia with recommendation of NPO including Palliative Care consult with pt choosing to be DNR so that he could assume risk of eating/drinking during his previous admissions. Would recommend a Modified Barium Swallow Study to fully assess pt's oropharyngeal abilities prior to initiating a diet. Will proceed with study on 01/08/2024. Given pt is currently a full code and his nurse documents "He had his finger in his mouth as if to stimulate vomiting upon entering with small bites of food eaten which he has been able to keep down so far" will make pt NPO until completion of study.   Joselle Deeds B. Dreama Saa, M.S., CCC-SLP, CBIS Speech-Language Pathologist Certified Brain Injury Specialist [facility]  [facility] [id number] Ascom [phone] Fax [phone]  Dyllon Henken Dreama Saa 01/07/2024, 1:10 PM

## 2024-01-07 NOTE — ED Notes (Signed)
 Brief changed, pericare provided and warm blankets placed on patient.
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
## 2024-01-07 NOTE — ED Triage Notes (Signed)
 Pt arrives via [facility] from home for CC of weakness for the last 10 hours. Pt states having trouble getting to and from wheelchair. EMS reports etoh on board.

## 2024-01-08 ENCOUNTER — Inpatient Hospital Stay

## 2024-01-08 DIAGNOSIS — E871 Hypo-osmolality and hyponatremia: Secondary | ICD-10-CM | POA: Diagnosis not present

## 2024-01-08 LAB — CBC
HCT: 36.8 % — ABNORMAL LOW (ref 39.0–52.0)
Hemoglobin: 13 g/dL (ref 13.0–17.0)
MCH: 31.3 pg (ref 26.0–34.0)
MCHC: 35.3 g/dL (ref 30.0–36.0)
MCV: 88.7 fL (ref 80.0–100.0)
Platelets: 123 10*3/uL — ABNORMAL LOW (ref 150–400)
RBC: 4.15 MIL/uL — ABNORMAL LOW (ref 4.22–5.81)
RDW: 13.2 % (ref 11.5–15.5)
WBC: 3.6 10*3/uL — ABNORMAL LOW (ref 4.0–10.5)
nRBC: 0 % (ref 0.0–0.2)

## 2024-01-08 LAB — BASIC METABOLIC PANEL
Anion gap: 11 (ref 5–15)
BUN: <5 mg/dL — ABNORMAL LOW (ref 8–23)
CO2: 23 mmol/L (ref 22–32)
Calcium: 8.3 mg/dL — ABNORMAL LOW (ref 8.9–10.3)
Chloride: 99 mmol/L (ref 98–111)
Creatinine, Ser: 0.52 mg/dL — ABNORMAL LOW (ref 0.61–1.24)
GFR, Estimated: >60 mL/min (ref >60)
Glucose, Bld: 75 mg/dL (ref 70–99)
Potassium: 3.3 mmol/L — ABNORMAL LOW (ref 3.5–5.1)
Sodium: 133 mmol/L — ABNORMAL LOW (ref 135–145)

## 2024-01-08 LAB — HEPATIC FUNCTION PANEL
ALT: 64 U/L — ABNORMAL HIGH (ref 0–44)
AST: 103 U/L — ABNORMAL HIGH (ref 15–41)
Albumin: 3.2 g/dL — ABNORMAL LOW (ref 3.5–5.0)
Alkaline Phosphatase: 104 U/L (ref 38–126)
Bilirubin, Direct: 0.5 mg/dL — ABNORMAL HIGH (ref 0.0–0.2)
Indirect Bilirubin: 1.3 mg/dL — ABNORMAL HIGH (ref 0.3–0.9)
Total Bilirubin: 1.8 mg/dL — ABNORMAL HIGH (ref 0.0–1.2)
Total Protein: 6.1 g/dL — ABNORMAL LOW (ref 6.5–8.1)

## 2024-01-08 NOTE — Procedures (Signed)
 Modified Barium Swallow Study  Patient Details  Name: Douglas Peterson. MRN: [id number] Date of Birth: Dec 02, 1950  Today's Date: 01/08/2024  Modified Barium Swallow completed.  Full report located under Chart Review in the Imaging Section.  History of Present Illness Douglas Peterson. is a 73 y.o. male who presented to [facility] ED on 01/07/2024 with generalized weakness.    Past medical history of alcohol intoxication, multiple admissions for alcohol withdrawal syndrome and alcoholic encephalopathy, paroxysmal atrial fibrillation, hypertension and TBI with right-sided contractures remotely. Chest x-ray - no active disease. CT Head No evidence of acute infarction, hemorrhage, hydrocephalus, extra-axial collection or mass lesion/mass effect. Cerebral atrophy. Pt with admitting dx of generalized weakness, fatigue, alcohol use and hyponatremia (to 123). Pt known to [facility] ED for EtOH intoxication.  Pt known to ST services, see below:  Pt with multiple MBSS:  12/24/2016 Moderate Pharyngeal Phase dysphagia Diet: Dysphagia 3/2 with thin liquids via cup, no straw  03/11/2018 moderate oropharyngeal phase dysphagia; esophageal phase dymotility SILENT ASPIRATION Deit: NPO  03/31/2019 Mild to moderate oropharyngeal dysphagia Diet: Dysphagia 3 with thin liquids via cup, no straw  09/23/2019 Severe oropharyngeal dysphagia Diet: Puree with honey thick liquids  07/03/2020 Mild sensori-motor pharyngeal dysphagia Diet: Dysphagia 2 with thin liquids via cup, no straw  05/05/2023 Moderate to Severe oropharyngeal dysphagia Silent aspiration Diet: NPO Pt chose to become a DNR so that he could continue consuming a PO diet  Clinical Impression  Pt presents with severe to profound oropharyngeal dysphagia that is sensori-motor in nature. This results in GROSS SILENT ASPIRATION of thin liquids, nectar thick liquids, honey thick liquids and puree. Recommend NPO>   Pt with forward head posture during  consumption and unable to follow directions. [name] oral phase is c/b decreased bolus cohesion with premature spillage of boluses into the vallecue, oral residue after the swallow results in mutlie swallows to effectively clear. Pt;s pharyngeal phase is c/b delayed swallow varies between vallecula and pyriform sinsus and widespread pharyngeal residue following each swallow. Pt with moments of silent aspiration during the swallow as well as silent aspiration of pharyngeal residue following the swallow. This occurs across all consistencies administered. Continue to recommend NPO as pt is at high risk of aspiration, manutrition and dehydration. Given the severity of impairment and pt's inability to follow directions, he is not a candidate for skilled ST services. Education provided to pt and his treatment team. ST to sign off at this time.      Factors that may increase risk of adverse event in presence of aspiration [name] 2021): Poor general health and/or compromised immunity;Reduced cognitive function;Limited mobility;Frail or deconditioned;Inadequate oral hygiene;Weak cough (EtOH intoxication)  Swallow Evaluation Recommendations Recommendations: NPO Medication Administration: Via alternative means Oral care recommendations: Oral care QID (4x/day) Recommended consults: Consider Palliative care     Dequane Strahan B. Dreama Saa, M.S., CCC-SLP, Tree surgeon Certified Brain Injury Specialist [facility]  [facility] Rehabilitation Services Office [phone] Ascom [phone] Fax [phone]

## 2024-01-08 NOTE — Plan of Care (Signed)
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
## 2024-01-09 DIAGNOSIS — E871 Hypo-osmolality and hyponatremia: Secondary | ICD-10-CM | POA: Diagnosis not present

## 2024-01-09 LAB — BASIC METABOLIC PANEL
Anion gap: 8 (ref 5–15)
BUN: 6 mg/dL — ABNORMAL LOW (ref 8–23)
CO2: 25 mmol/L (ref 22–32)
Calcium: 8.7 mg/dL — ABNORMAL LOW (ref 8.9–10.3)
Chloride: 100 mmol/L (ref 98–111)
Creatinine, Ser: 0.55 mg/dL — ABNORMAL LOW (ref 0.61–1.24)
GFR, Estimated: >60 mL/min (ref >60)
Glucose, Bld: 96 mg/dL (ref 70–99)
Potassium: 3.3 mmol/L — ABNORMAL LOW (ref 3.5–5.1)
Sodium: 133 mmol/L — ABNORMAL LOW (ref 135–145)

## 2024-01-09 MED ORDER — POTASSIUM CHLORIDE CRYS ER 20 MEQ PO TBCR
40.0000 meq | EXTENDED_RELEASE_TABLET | Freq: Once | ORAL | Status: AC
  Administered 2024-01-09: 40 meq via ORAL
  Filled 2024-01-09: qty 2
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
## 2024-01-09 NOTE — Plan of Care (Signed)
  Problem: Education: Goal: Knowledge of General Education information will improve Description: Including pain rating scale, medication(s)/side effects and non-pharmacologic comfort measures Outcome: Progressing   Problem: Health Behavior/Discharge Planning: Goal: Ability to manage health-related needs will improve Outcome: Progressing   Problem: Clinical Measurements: Goal: Ability to maintain clinical measurements within normal limits will improve Outcome: Progressing Goal: Will remain free from infection Outcome: Progressing Goal: Diagnostic test results will improve Outcome: Progressing Goal: Respiratory complications will improve Outcome: Progressing Goal: Cardiovascular complication will be avoided Outcome: Progressing   Problem: Activity: Goal: Risk for activity intolerance will decrease Outcome: Progressing   Problem: Pain Managment: Goal: General experience of comfort will improve and/or be controlled Outcome: Progressing   Problem: Safety: Goal: Ability to remain free from injury will improve Outcome: Progressing   Problem: Skin Integrity: Goal: Risk for impaired skin integrity will decrease Outcome: Progressing

## 2024-01-09 NOTE — Plan of Care (Signed)

## 2024-01-10 DIAGNOSIS — Z66 Do not resuscitate: Secondary | ICD-10-CM

## 2024-01-10 DIAGNOSIS — E871 Hypo-osmolality and hyponatremia: Secondary | ICD-10-CM | POA: Diagnosis not present

## 2024-01-10 DIAGNOSIS — Z515 Encounter for palliative care: Secondary | ICD-10-CM

## 2024-01-10 DIAGNOSIS — F1010 Alcohol abuse, uncomplicated: Secondary | ICD-10-CM

## 2024-01-10 DIAGNOSIS — Z7189 Other specified counseling: Secondary | ICD-10-CM
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
## 2024-01-10 NOTE — Progress Notes (Signed)
 [name] alert and oriented X 4. Pt sister  brings in pt clothes, shoes, charger and cane and personal  cards.
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
## 2024-01-11 DIAGNOSIS — E871 Hypo-osmolality and hyponatremia: Secondary | ICD-10-CM | POA: Diagnosis not present

## 2024-01-11 NOTE — Progress Notes (Signed)
 Diona Fanti from DSS at desk to try to speak with TOC in order to make a plan for patient for a safe discharge.

## 2024-01-11 NOTE — [place] FL2 (Signed)
 [place] MEDICAID FL2 LEVEL OF CARE FORM     IDENTIFICATION  Patient Name: Douglas Peterson. Birthdate: 11-Jan-1951 Sex: male Admission Date (Current Location): 01/07/2024  [place] Number:  Chiropodist and Address:  [facility], [address] [place], [place] [address]      Provider Number: [id number]  Attending Physician Name and Address:  Tresa Moore, MD  Relative Name and Phone Number:  Kellie Simmering (Daughter)  [phone] (Mobile)    Current Level of Care: Hospital Recommended Level of Care: Skilled Nursing Facility Prior Approval Number:    Date Approved/Denied:   PASRR Number: [id number]  Discharge Plan: SNF    Current Diagnoses: Patient Active Problem List   Diagnosis Date Noted   Intractable nausea and vomiting 10/06/2023   Protein-calorie malnutrition, severe 08/20/2023   Ground-level fall 08/16/2023   GERD without esophagitis 08/06/2023   Nausea 07/19/2023   Pressure injury of skin 07/11/2023   Alcohol abuse 07/11/2023   Abdominal pain 06/16/2023   Elevated lactic acid level 06/16/2023   Chronic diastolic CHF (congestive heart failure) (HCC) 06/16/2023   Increased urinary frequency 06/08/2023   Diarrhea 06/08/2023   Upper respiratory infection 06/04/2023   Enteritis 06/02/2023   Polyuria 06/02/2023   Dyslipidemia 05/01/2023   Alcohol dependence (HCC) 05/01/2023   Maggot infestation feet 05/11/2022   Unable to care for self 05/11/2022   Debility 04/25/2022   Weakness 04/23/2022   Epistaxis 04/23/2022   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   Generalized weakness 03/18/2022   Nondisplaced fracture of shaft of left clavicle, initial encounter for closed fracture 03/18/2022   BPH (benign prostatic hyperplasia) 03/18/2022   Clavicle fracture 03/17/2022   Fall 03/17/2022   CAP (community acquired pneumonia) 03/13/2022   Altered mental status, unspecified 02/20/2022   Hypotension 02/19/2022    Dysphonia 08/13/2021   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   Paresthesia of right upper extremity 08/13/2021   SIRS (systemic inflammatory response syndrome) (HCC) 08/05/2020   GERD (gastroesophageal reflux disease) 06/05/2020   Alcohol use disorder, severe, dependence (HCC) 05/28/2020   Elevated LFTs 04/06/2020   Paroxysmal A-fib (HCC) 03/17/2020   Primary insomnia 02/06/2020   Left inguinal hernia 01/31/2020   Encounter for competency evaluation    Depression 01/17/2020   Pancytopenia (HCC)    Hypomagnesemia    Alcoholic cirrhosis of liver without ascites (HCC)    ETOH abuse    Alcohol intoxication (HCC)    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) 09/27/2019   Hypokalemia 09/27/2019   Alcohol withdrawal delirium, acute, hyperactive (HCC) 09/21/2019   Pressure injury of ankle, stage 1 08/15/2019   Palliative care by specialist    Closed fracture of right proximal humerus 03/19/2019   Vitamin D deficiency 01/11/2019   Osteoporosis 12/22/2018   Closed nondisplaced fracture of acromial process of right scapula with routine healing 11/15/2018   Compression fracture of L2 vertebra with routine healing 11/15/2018   Delirium tremens (HCC) 03/08/2018   Essential hypertension 09/16/2017   Encephalopathy, 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   Steatohepatitis 12/03/2016   Abnormal liver enzymes 06/17/2016   Hereditary hemochromatosis (HCC) 06/17/2016   Anxiety 07/16/2015   Hyponatremia 07/09/2015   H/O traumatic brain injury 05/22/2014   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HCC) 05/22/2014    Orientation RESPIRATION BLADDER Height & Weight     Time, Self, Situation, Place  Normal Continent Weight: 95 lb (43.1 kg) Height:  5\' 4"  (162.6 cm)  BEHAVIORAL SYMPTOMS/MOOD NEUROLOGICAL BOWEL NUTRITION STATUS  Incontinent Diet (see d/c summary)  AMBULATORY STATUS COMMUNICATION OF NEEDS Skin   Extensive Assist Verbally PU Stage and Appropriate Care                        Personal Care Assistance Level of Assistance  Bathing, Dressing, Feeding Bathing Assistance: Maximum assistance Feeding assistance: Independent Dressing Assistance: Limited assistance     Functional Limitations Info  Sight, Hearing, Speech Sight Info: Adequate Hearing Info: Adequate Speech Info: Adequate    SPECIAL CARE FACTORS FREQUENCY  PT (By licensed PT), OT (By licensed OT)     PT Frequency: 5x/week OT Frequency: 5x/week            Contractures Contractures Info: Not present    Additional Factors Info  Code Status, Allergies Code Status Info: DNR Allergies Info: Hydrochlorothiazide           Current Medications (01/11/2024):  This is the current hospital active medication list Current Facility-Administered Medications  Medication Dose Route Frequency Provider Last Rate Last Admin   acetaminophen (TYLENOL) tablet 650 mg  650 mg Oral Q6H PRN Mansy, Jan A, MD   650 mg at 01/11/24 1353   Or   acetaminophen (TYLENOL) suppository 650 mg  650 mg Rectal Q6H PRN Mansy, Jan A, MD       amLODipine (NORVASC) tablet 5 mg  5 mg Oral Daily Mikey College T, MD   5 mg at 01/11/24 1027   atenolol (TENORMIN) tablet 12.5 mg  12.5 mg Oral Daily Mikey College T, MD   12.5 mg at 01/11/24 1026   baclofen (LIORESAL) tablet 5 mg  5 mg Oral TID Mikey College T, MD   5 mg at 01/11/24 1026   enoxaparin (LOVENOX) injection 30 mg  30 mg Subcutaneous Q24H Hunt, Madison H, RPH   30 mg at 01/11/24 1027   feeding supplement (ENSURE ENLIVE / ENSURE PLUS) liquid 237 mL  237 mL Oral BID BM Mikey College T, MD   237 mL at 01/11/24 1027   folic acid (FOLVITE) tablet 1 mg  1 mg Oral Daily Pilar Jarvis, MD   1 mg at 01/11/24 1026   magnesium hydroxide (MILK OF MAGNESIA) suspension 30 mL  30 mL Oral Daily PRN Mansy, Jan A, MD       melatonin tablet 5 mg  5 mg Oral QHS Mikey College T, MD   5 mg at 01/10/24 2141   multivitamin with minerals tablet 1 tablet  1 tablet Oral Daily Pilar Jarvis, MD   1 tablet at  01/11/24 1026   ondansetron (ZOFRAN) tablet 4 mg  4 mg Oral Q6H PRN Mansy, Jan A, MD       Or   ondansetron [facility]) injection 4 mg  4 mg Intravenous Q6H PRN Mansy, Jan A, MD   4 mg at 01/08/24 1726   pantoprazole (PROTONIX) EC tablet 40 mg  40 mg Oral Daily Mikey College T, MD   40 mg at 01/11/24 1026   polyethylene glycol (MIRALAX / GLYCOLAX) packet 17 g  17 g Oral Daily PRN Mikey College T, MD       sucralfate (CARAFATE) tablet 1 g  1 g Oral TID WC & HS Mikey College T, MD   1 g at 01/11/24 1027   thiamine (VITAMIN B1) tablet 100 mg  100 mg Oral Daily Pilar Jarvis, MD   100 mg at 01/11/24 1027   Or   thiamine (VITAMIN B1) injection 100 mg  100  mg Intravenous Daily Pilar Jarvis, MD   100 mg at 01/08/24 0908   traZODone (DESYREL) tablet 25 mg  25 mg Oral QHS PRN Mansy, Jan A, MD   25 mg at 01/09/24 0116     Discharge Medications: Please see discharge summary for a list of discharge medications.  Relevant Imaging Results:  Relevant Lab Results:   Additional Information SSN: [id number]  Erin Sons, LCSW

## 2024-01-11 NOTE — Progress Notes (Signed)
 Mobility Specialist - Progress Note   01/11/24 1600  Mobility  Activity Refused mobility     Pt speaking with DSS on arrival. Will attempt another date/time.    [name] Mobility Specialist 01/11/24, 4:15 PM

## 2024-01-11 NOTE — Care Management Important Message (Signed)
 Important Message  Patient Details  Name: Douglas Peterson. MRN: [id number] Date of Birth: 08/15/1951   Important Message Given:  Yes - Medicare IM     Cristela Blue, CMA 01/11/2024, 12:43 PM
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
## 2024-01-11 NOTE — TOC Progression Note (Signed)
 Transition of Care (TOC) - Progression Note    Patient Details  Name: Douglas Peterson. MRN: [id number] Date of Birth: 05-Oct-1951  Transition of Care [facility]) CM/SW Contact  Averie Meiner [place], [place] Phone Number: 01/11/2024, 4:33 PM  Clinical Narrative:     CSW met with with DSS social worker Diona Fanti who informed CSW that she has been working with pt offering treatment/case management services though that pt continues to refuse services. He has frequently refused private care givers in the home She explains pt has the right to make his own choices and DSS can only offer their services. She did state he was interested in going to SNF for rehab at [facility]. CSW met with pt bedside who confirmed he would like [facility]. He has been there before in the past. He is agreeable to CSW sending additional referrals though is very reluctant to consider any other SNF other than [facility]. Fl2 completed and bed requests sent in hub.   Expected Discharge Plan: Skilled Nursing Facility Barriers to Discharge: Continued Medical Work up  Expected Discharge Plan and Services     Post Acute Care Choice: Home Health Living arrangements for the past 2 months: Single Family Home                           HH Arranged: PT HH Agency: [facility] Date [facility] Agency Contacted: 01/07/24   Representative spoke with at [facility] Agency: Becky Sax   Social Determinants of Health (SDOH) Interventions SDOH Screenings   Food Insecurity: No Food Insecurity (01/07/2024)  Housing: Low Risk  (01/07/2024)  Transportation Needs: No Transportation Needs (01/07/2024)  Utilities: Not At Risk (01/07/2024)  Financial Resource Strain: Unknown (06/13/2019)  Physical Activity: Unknown (06/13/2019)  Social Connections: Socially Isolated (01/07/2024)  Stress: Unknown (06/13/2019)  Tobacco Use: Medium Risk (01/07/2024)    Readmission Risk Interventions    01/07/2024   11:31 AM 06/17/2023    4:23 PM 04/29/2022   10:26 AM   Readmission Risk Prevention Plan  Transportation Screening Complete Complete Complete  HRI or Home Care Consult  Complete Complete  Social Work Consult for Recovery Care Planning/Counseling  Complete Complete  Palliative Care Screening  Not Applicable Not Applicable  Medication Review Oceanographer) Complete Complete Complete  PCP or Specialist appointment within 3-5 days of discharge Complete    HRI or Home Care Consult Complete    SW Recovery Care/Counseling Consult Complete    Palliative Care Screening Not Applicable    Skilled Nursing Facility Not Applicable
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
## 2024-01-12 DIAGNOSIS — E871 Hypo-osmolality and hyponatremia: Secondary | ICD-10-CM | POA: Diagnosis not present

## 2024-01-12 NOTE — Plan of Care (Signed)
  Problem: Education: Goal: Knowledge of General Education information will improve Description: Including pain rating scale, medication(s)/side effects and non-pharmacologic comfort measures Outcome: Not Progressing   Problem: Clinical Measurements: Goal: Respiratory complications will improve Outcome: Adequate for Discharge Goal: Cardiovascular complication will be avoided Outcome: Adequate for Discharge   Problem: Activity: Goal: Risk for activity intolerance will decrease Outcome: Not Progressing

## 2024-01-12 NOTE — Progress Notes (Signed)
 Introduced patient to role of Statistician. Intake questions completed.   Patient known to nurse navigator from admissions in the past. Patient does not wish to abstain from alcohol at this time, despite stating "I need to quit drinking so much." Although patient does NOT have a current PCP, which is a barrier to [facility] services, patient states "I've been fired from everyone in this county." Pt unwilling to allow nurse navigator to attempt to find practice that patient has not been discharged from.   Patient requesting to be placed on bedpan. Patient placed on bedpan and call bell placed in reach. Patient also states it "hurts when I try to poop." Bedside RN made aware.
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
## 2024-01-12 NOTE — Plan of Care (Signed)

## 2024-01-12 NOTE — TOC Progression Note (Addendum)
 Transition of Care (TOC) - Progression Note    Patient Details  Name: Douglas Peterson. MRN: [id number] Date of Birth: [birth year]  Transition of Care [facility]) CM/SW Contact  Erin Sons, [place] Phone Number: 01/12/2024, 3:23 PM  Clinical Narrative:     [facility] can offer bed. Insurance Berkley Harvey has been started and is pending.   1630: Auth approved, [id number] 3/12 - 3/18  Pt can admit to [facility] tomorrow.   Expected Discharge Plan: Skilled Nursing Facility Barriers to Discharge: Continued Medical Work up  Expected Discharge Plan and Services     Post Acute Care Choice: Home Health Living arrangements for the past 2 months: Single Family Home                           HH Arranged: PT HH Agency: [facility] Date [facility] Agency Contacted: 01/07/24   Representative spoke with at [facility] Agency: Becky Sax   Social Determinants of Health (SDOH) Interventions SDOH Screenings   Food Insecurity: No Food Insecurity (01/07/2024)  Housing: Low Risk  (01/07/2024)  Transportation Needs: No Transportation Needs (01/07/2024)  Utilities: Not At Risk (01/07/2024)  Financial Resource Strain: Unknown (06/13/2019)  Physical Activity: Unknown (06/13/2019)  Social Connections: Socially Isolated (01/07/2024)  Stress: Unknown (06/13/2019)  Tobacco Use: Medium Risk (01/07/2024)    Readmission Risk Interventions    01/07/2024   11:31 AM 06/17/2023    4:23 PM 04/29/2022   10:26 AM  Readmission Risk Prevention Plan  Transportation Screening Complete Complete Complete  HRI or Home Care Consult  Complete Complete  Social Work Consult for Recovery Care Planning/Counseling  Complete Complete  Palliative Care Screening  Not Applicable Not Applicable  Medication Review Oceanographer) Complete Complete Complete  PCP or Specialist appointment within 3-5 days of discharge Complete    HRI or Home Care Consult Complete    SW Recovery Care/Counseling Consult Complete    Palliative  Care Screening Not Applicable    Skilled Nursing Facility Not Applicable
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
## 2024-01-13 DIAGNOSIS — E871 Hypo-osmolality and hyponatremia: Secondary | ICD-10-CM | POA: Diagnosis not present

## 2024-01-13 LAB — CBC WITH DIFFERENTIAL/PLATELET
Abs Immature Granulocytes: 0.01 10*3/uL (ref 0.00–0.07)
Basophils Absolute: 0.1 10*3/uL (ref 0.0–0.1)
Basophils Relative: 1 %
Eosinophils Absolute: 0.1 10*3/uL (ref 0.0–0.5)
Eosinophils Relative: 2 %
HCT: 40.8 % (ref 39.0–52.0)
Hemoglobin: 13.9 g/dL (ref 13.0–17.0)
Immature Granulocytes: 0 %
Lymphocytes Relative: 50 %
Lymphs Abs: 2.5 10*3/uL (ref 0.7–4.0)
MCH: 31.4 pg (ref 26.0–34.0)
MCHC: 34.1 g/dL (ref 30.0–36.0)
MCV: 92.3 fL (ref 80.0–100.0)
Monocytes Absolute: 0.6 10*3/uL (ref 0.1–1.0)
Monocytes Relative: 12 %
Neutro Abs: 1.7 10*3/uL (ref 1.7–7.7)
Neutrophils Relative %: 35 %
Platelets: 141 10*3/uL — ABNORMAL LOW (ref 150–400)
RBC: 4.42 MIL/uL (ref 4.22–5.81)
RDW: 14 % (ref 11.5–15.5)
WBC: 5 10*3/uL (ref 4.0–10.5)
nRBC: 0 % (ref 0.0–0.2)

## 2024-01-13 LAB — BASIC METABOLIC PANEL
Anion gap: 8 (ref 5–15)
BUN: 15 mg/dL (ref 8–23)
CO2: 27 mmol/L (ref 22–32)
Calcium: 9.1 mg/dL (ref 8.9–10.3)
Chloride: 103 mmol/L (ref 98–111)
Creatinine, Ser: 0.65 mg/dL (ref 0.61–1.24)
GFR, Estimated: >60 mL/min (ref >60)
Glucose, Bld: 94 mg/dL (ref 70–99)
Potassium: 4.2 mmol/L (ref 3.5–5.1)
Sodium: 138 mmol/L (ref 135–145)

## 2024-01-13 NOTE — Plan of Care (Signed)

## 2024-01-13 NOTE — Plan of Care (Signed)
 Met all nursing goals of care, ready for discharge to skilled facility.
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
## 2024-01-13 NOTE — Progress Notes (Signed)
 Mobility Specialist - Progress Note   01/13/24 1000  Mobility  Activity Ambulated with assistance in hallway;Ambulated with assistance to bathroom  Level of Assistance Contact guard assist, steadying assist  Assistive Device Cane  Distance Ambulated (ft) 120 ft  Activity Response Tolerated well  Mobility visit 1 Mobility     Pt lying in bed upon arrival, finishing breakfast. Utilizing RA. Pt agreeable to activity. Completed bed mobility with supervision. STS and ambulation in room/hallway with CGA. 3 mild LOB, corrected with CGA. Pt voices feeling weak but no other complaints. Pt left on toilet, hopeful for BM after first attempt was unsuccessful. NT notified. Pt aware to pull cord for assistance when finished.    Douglas Peterson Mobility Specialist 01/13/24, 10:07 AM

## 2024-01-13 NOTE — TOC Transition Note (Signed)
 Transition of Care [facility]) - Discharge Note   Patient Details  Name: Douglas Peterson. MRN: [id number] Date of Birth: Jun 18, 1951  Transition of Care [facility]) CM/SW Contact:  Garret Reddish, RN Phone Number: 01/13/2024, 2:57 PM   Clinical Narrative:    Chart reviewed.  I have received call from Tammy, Admissions Coordinator with [facility].  She informs me that she has received SNF authorization for SNF today.  She informs me that patient will go to room 601A and the number to call report is [phone].  I have sent Tammy patient 's Discharge Summary, SNF transfer Report and Discharge Medications via Epic hub.    I have left voicemail for patient's daughter to make her aware that patient will be a discharge to SNF today.    I have arranged [facility] to transport patient to [facility] today.    I have informed staff nurse of the above information.       Final next level of care: Skilled Nursing Facility Barriers to Discharge: No Barriers Identified   Patient Goals and CMS Choice   CMS Medicare.gov Compare Post Acute Care list provided to:: Patient Choice offered to / list presented to : Patient      Discharge Placement              Patient chooses bed at: [facility] Patient to be transferred to facility by: [facility]  EMS Name of family member notified: LVM for patient's daughter Patient and family notified of of transfer: 01/13/24  Discharge Plan and Services Additional resources added to the After Visit Summary for       Post Acute Care Choice: Home Health                    HH Arranged: PT [facility] Agency: [facility] Date [facility] Agency Contacted: 01/07/24   Representative spoke with at [facility] Agency: Becky Sax  Social Drivers of Health (SDOH) Interventions SDOH Screenings   Food Insecurity: No Food Insecurity (01/07/2024)  Housing: Low Risk  (01/07/2024)  Transportation Needs: No Transportation Needs (01/07/2024)   Utilities: Not At Risk (01/07/2024)  Financial Resource Strain: Unknown (06/13/2019)  Physical Activity: Unknown (06/13/2019)  Social Connections: Socially Isolated (01/07/2024)  Stress: Unknown (06/13/2019)  Tobacco Use: Medium Risk (01/07/2024)     Readmission Risk Interventions    01/07/2024   11:31 AM 06/17/2023    4:23 PM 04/29/2022   10:26 AM  Readmission Risk Prevention Plan  Transportation Screening Complete Complete Complete  HRI or Home Care Consult  Complete Complete  Social Work Consult for Recovery Care Planning/Counseling  Complete Complete  Palliative Care Screening  Not Applicable Not Applicable  Medication Review Oceanographer) Complete Complete Complete  PCP or Specialist appointment within 3-5 days of discharge Complete    HRI or Home Care Consult Complete    SW Recovery Care/Counseling Consult Complete    Palliative Care Screening Not Applicable    Skilled Nursing Facility Not Applicable

## 2024-01-27 ENCOUNTER — Other Ambulatory Visit: Payer: Self-pay

## 2024-01-27 ENCOUNTER — Emergency Department

## 2024-01-27 ENCOUNTER — Observation Stay
Admission: EM | Admit: 2024-01-27 | Discharge: 2024-01-28 | Disposition: A | Discharge status: Skilled Nursing Facility | Attending: Emergency Medicine | Admitting: Emergency Medicine

## 2024-01-27 ENCOUNTER — Emergency Department: Admitting: Anesthesiology

## 2024-01-27 ENCOUNTER — Encounter: Payer: Self-pay | Admitting: Intensive Care

## 2024-01-27 ENCOUNTER — Encounter
Admission: EM | Disposition: A | Discharge status: Skilled Nursing Facility | Payer: Self-pay | Source: Home / Self Care | Attending: Emergency Medicine

## 2024-01-27 DIAGNOSIS — Z87891 Personal history of nicotine dependence: Secondary | ICD-10-CM | POA: Diagnosis not present

## 2024-01-27 DIAGNOSIS — I480 Paroxysmal atrial fibrillation: Secondary | ICD-10-CM | POA: Insufficient documentation

## 2024-01-27 DIAGNOSIS — I5032 Chronic diastolic (congestive) heart failure: Secondary | ICD-10-CM | POA: Insufficient documentation

## 2024-01-27 DIAGNOSIS — I110 Hypertensive heart disease with heart failure: Secondary | ICD-10-CM | POA: Diagnosis not present

## 2024-01-27 DIAGNOSIS — Z8719 Personal history of other diseases of the digestive system: Secondary | ICD-10-CM

## 2024-01-27 DIAGNOSIS — K4030 Unilateral inguinal hernia, with obstruction, without gangrene, not specified as recurrent: Principal | ICD-10-CM | POA: Insufficient documentation

## 2024-01-27 DIAGNOSIS — R1030 Lower abdominal pain, unspecified: Secondary | ICD-10-CM | POA: Diagnosis present

## 2024-01-27 DIAGNOSIS — K460 Unspecified abdominal hernia with obstruction, without gangrene: Principal | ICD-10-CM

## 2024-01-27 HISTORY — PX: XI ROBOTIC ASSISTED INGUINAL HERNIA REPAIR WITH MESH: SHX6706

## 2024-01-27 LAB — COMPREHENSIVE METABOLIC PANEL
ALT: 43 U/L (ref 0–44)
AST: 37 U/L (ref 15–41)
Albumin: 3.8 g/dL (ref 3.5–5.0)
Alkaline Phosphatase: 72 U/L (ref 38–126)
Anion gap: 11 (ref 5–15)
BUN: 8 mg/dL (ref 8–23)
CO2: 23 mmol/L (ref 22–32)
Calcium: 8.5 mg/dL — ABNORMAL LOW (ref 8.9–10.3)
Chloride: 104 mmol/L (ref 98–111)
Creatinine, Ser: 0.52 mg/dL — ABNORMAL LOW (ref 0.61–1.24)
GFR, Estimated: >60 mL/min (ref >60)
Glucose, Bld: 90 mg/dL (ref 70–99)
Potassium: 3.6 mmol/L (ref 3.5–5.1)
Sodium: 138 mmol/L (ref 135–145)
Total Bilirubin: 0.7 mg/dL (ref 0.0–1.2)
Total Protein: 7 g/dL (ref 6.5–8.1)

## 2024-01-27 LAB — CBC WITH DIFFERENTIAL/PLATELET
Abs Immature Granulocytes: 0.01 10*3/uL (ref 0.00–0.07)
Basophils Absolute: 0.1 10*3/uL (ref 0.0–0.1)
Basophils Relative: 1 %
Eosinophils Absolute: 0.2 10*3/uL (ref 0.0–0.5)
Eosinophils Relative: 3 %
HCT: 46.8 % (ref 39.0–52.0)
Hemoglobin: 15.9 g/dL (ref 13.0–17.0)
Immature Granulocytes: 0 %
Lymphocytes Relative: 55 %
Lymphs Abs: 3.2 10*3/uL (ref 0.7–4.0)
MCH: 32 pg (ref 26.0–34.0)
MCHC: 34 g/dL (ref 30.0–36.0)
MCV: 94.2 fL (ref 80.0–100.0)
Monocytes Absolute: 0.4 10*3/uL (ref 0.1–1.0)
Monocytes Relative: 7 %
Neutro Abs: 2 10*3/uL (ref 1.7–7.7)
Neutrophils Relative %: 34 %
Platelets: 186 10*3/uL (ref 150–400)
RBC: 4.97 MIL/uL (ref 4.22–5.81)
RDW: 12.9 % (ref 11.5–15.5)
WBC: 5.9 10*3/uL (ref 4.0–10.5)
nRBC: 0 % (ref 0.0–0.2)

## 2024-01-27 LAB — LIPASE, BLOOD: Lipase: 32 U/L (ref 11–51)

## 2024-01-27 SURGERY — REPAIR, HERNIA, INGUINAL, ROBOT-ASSISTED, LAPAROSCOPIC, USING MESH
Anesthesia: General | Laterality: Left

## 2024-01-27 MED ORDER — PANTOPRAZOLE SODIUM 40 MG PO TBEC
40.0000 mg | DELAYED_RELEASE_TABLET | Freq: Every day | ORAL | Status: DC
  Administered 2024-01-27 – 2024-01-28 (×2): 40 mg via ORAL
  Filled 2024-01-27 (×2): qty 1

## 2024-01-27 MED ORDER — FENTANYL CITRATE (PF) 100 MCG/2ML IJ SOLN
25.0000 ug | INTRAMUSCULAR | Status: DC | PRN
  Administered 2024-01-27: 25 ug via INTRAVENOUS

## 2024-01-27 MED ORDER — PHENYLEPHRINE 80 MCG/ML (10ML) SYRINGE FOR IV PUSH (FOR BLOOD PRESSURE SUPPORT)
PREFILLED_SYRINGE | INTRAVENOUS | Status: DC | PRN
  Administered 2024-01-27 (×2): 80 ug via INTRAVENOUS
  Administered 2024-01-27: 160 ug via INTRAVENOUS

## 2024-01-27 MED ORDER — OXYCODONE HCL 5 MG PO TABS
5.0000 mg | ORAL_TABLET | Freq: Once | ORAL | Status: AC | PRN
  Administered 2024-01-27: 5 mg via ORAL

## 2024-01-27 MED ORDER — BACLOFEN 10 MG PO TABS
5.0000 mg | ORAL_TABLET | Freq: Three times a day (TID) | ORAL | Status: DC
  Administered 2024-01-27 – 2024-01-28 (×2): 5 mg via ORAL
  Filled 2024-01-27 (×2): qty 1

## 2024-01-27 MED ORDER — CEFAZOLIN SODIUM-DEXTROSE 2-4 GM/100ML-% IV SOLN
INTRAVENOUS | Status: AC
  Filled 2024-01-27: qty 100

## 2024-01-27 MED ORDER — CEFAZOLIN SODIUM-DEXTROSE 2-4 GM/100ML-% IV SOLN
2.0000 g | Freq: Three times a day (TID) | INTRAVENOUS | Status: DC
  Administered 2024-01-27: 2 g via INTRAVENOUS
  Filled 2024-01-27: qty 100

## 2024-01-27 MED ORDER — 0.9 % SODIUM CHLORIDE (POUR BTL) OPTIME
TOPICAL | Status: DC | PRN
  Administered 2024-01-27: 500 mL

## 2024-01-27 MED ORDER — DROPERIDOL 2.5 MG/ML IJ SOLN: 0.6250 mg | Freq: Once | INTRAMUSCULAR | Status: DC | PRN

## 2024-01-27 MED ORDER — LACTATED RINGERS IV SOLN
INTRAVENOUS | Status: DC | PRN
Start: 2024-01-27 — End: 2024-01-27

## 2024-01-27 MED ORDER — OXYCODONE HCL 5 MG PO TABS
ORAL_TABLET | ORAL | Status: AC
  Filled 2024-01-27: qty 1

## 2024-01-27 MED ORDER — CEFAZOLIN SODIUM-DEXTROSE 2-4 GM/100ML-% IV SOLN
2.0000 g | Freq: Three times a day (TID) | INTRAVENOUS | Status: AC
  Administered 2024-01-27: 2 g via INTRAVENOUS
  Filled 2024-01-27: qty 100

## 2024-01-27 MED ORDER — ROCURONIUM BROMIDE 100 MG/10ML IV SOLN
INTRAVENOUS | Status: DC | PRN
  Administered 2024-01-27: 70 mg via INTRAVENOUS

## 2024-01-27 MED ORDER — LIDOCAINE HCL (CARDIAC) PF 100 MG/5ML IV SOSY
PREFILLED_SYRINGE | INTRAVENOUS | Status: DC | PRN
  Administered 2024-01-27: 100 mg via INTRAVENOUS

## 2024-01-27 MED ORDER — SODIUM CHLORIDE 0.9 % IV SOLN: INTRAVENOUS | Status: DC

## 2024-01-27 MED ORDER — FENTANYL CITRATE (PF) 100 MCG/2ML IJ SOLN
INTRAMUSCULAR | Status: AC
  Filled 2024-01-27: qty 2

## 2024-01-27 MED ORDER — EPHEDRINE 5 MG/ML INJ
INTRAVENOUS | Status: AC
  Filled 2024-01-27: qty 5

## 2024-01-27 MED ORDER — LIDOCAINE HCL (PF) 2 % IJ SOLN
INTRAMUSCULAR | Status: AC
  Filled 2024-01-27: qty 5

## 2024-01-27 MED ORDER — DEXAMETHASONE SODIUM PHOSPHATE 10 MG/ML IJ SOLN
INTRAMUSCULAR | Status: AC
  Filled 2024-01-27: qty 1

## 2024-01-27 MED ORDER — FENTANYL CITRATE (PF) 100 MCG/2ML IJ SOLN
INTRAMUSCULAR | Status: DC | PRN
  Administered 2024-01-27: 50 ug via INTRAVENOUS

## 2024-01-27 MED ORDER — MELATONIN 5 MG PO TABS
5.0000 mg | ORAL_TABLET | Freq: Every day | ORAL | Status: DC
Start: 2024-01-27 — End: 2024-01-28
  Administered 2024-01-27: 5 mg via ORAL
  Filled 2024-01-27: qty 1

## 2024-01-27 MED ORDER — ONDANSETRON HCL 4 MG/2ML IJ SOLN: 4.0000 mg | Freq: Four times a day (QID) | INTRAMUSCULAR | Status: DC | PRN

## 2024-01-27 MED ORDER — AMLODIPINE BESYLATE 5 MG PO TABS
5.0000 mg | ORAL_TABLET | Freq: Every day | ORAL | Status: DC
  Administered 2024-01-27 – 2024-01-28 (×2): 5 mg via ORAL
  Filled 2024-01-27 (×2): qty 1

## 2024-01-27 MED ORDER — SUGAMMADEX SODIUM 200 MG/2ML IV SOLN
INTRAVENOUS | Status: AC
  Filled 2024-01-27: qty 2

## 2024-01-27 MED ORDER — SUGAMMADEX SODIUM 200 MG/2ML IV SOLN
INTRAVENOUS | Status: AC
Start: 2024-01-27 — End: ?
  Filled 2024-01-27: qty 2

## 2024-01-27 MED ORDER — KETOROLAC TROMETHAMINE 15 MG/ML IJ SOLN
INTRAMUSCULAR | Status: AC
Start: 2024-01-27 — End: ?
  Filled 2024-01-27: qty 1

## 2024-01-27 MED ORDER — ADULT MULTIVITAMIN W/MINERALS CH
1.0000 | ORAL_TABLET | Freq: Every day | ORAL | Status: DC
  Administered 2024-01-28: 1 via ORAL
  Filled 2024-01-27: qty 1

## 2024-01-27 MED ORDER — SUGAMMADEX SODIUM 200 MG/2ML IV SOLN
INTRAVENOUS | Status: DC | PRN
  Administered 2024-01-27 (×2): 200 mg via INTRAVENOUS

## 2024-01-27 MED ORDER — BUPIVACAINE-EPINEPHRINE (PF) 0.25% -1:200000 IJ SOLN
INTRAMUSCULAR | Status: DC | PRN
  Administered 2024-01-27: 30 mL via PERINEURAL

## 2024-01-27 MED ORDER — ONDANSETRON HCL 4 MG/2ML IJ SOLN
INTRAMUSCULAR | Status: DC | PRN
Start: 2024-01-27 — End: 2024-01-27
  Administered 2024-01-27: 4 mg via INTRAVENOUS

## 2024-01-27 MED ORDER — FOLIC ACID 1 MG PO TABS
1.0000 mg | ORAL_TABLET | Freq: Every day | ORAL | Status: DC
  Administered 2024-01-28: 1 mg via ORAL
  Filled 2024-01-27: qty 1

## 2024-01-27 MED ORDER — POLYETHYLENE GLYCOL 3350 17 G PO PACK
17.0000 g | PACK | Freq: Every day | ORAL | Status: DC | PRN
  Administered 2024-01-28: 17 g via ORAL
  Filled 2024-01-27: qty 1

## 2024-01-27 MED ORDER — ROCURONIUM BROMIDE 10 MG/ML (PF) SYRINGE
PREFILLED_SYRINGE | INTRAVENOUS | Status: AC
  Filled 2024-01-27: qty 10

## 2024-01-27 MED ORDER — DIPHENHYDRAMINE HCL 12.5 MG/5ML PO ELIX
12.5000 mg | ORAL_SOLUTION | Freq: Four times a day (QID) | ORAL | Status: DC | PRN
  Administered 2024-01-28: 12.5 mg via ORAL
  Filled 2024-01-27: qty 5

## 2024-01-27 MED ORDER — KETOROLAC TROMETHAMINE 15 MG/ML IJ SOLN
15.0000 mg | Freq: Four times a day (QID) | INTRAMUSCULAR | Status: DC | PRN
  Administered 2024-01-27: 15 mg via INTRAVENOUS

## 2024-01-27 MED ORDER — HYDROMORPHONE HCL 1 MG/ML IJ SOLN
0.5000 mg | Freq: Once | INTRAMUSCULAR | Status: AC
  Administered 2024-01-27: 0.5 mg via INTRAVENOUS
  Filled 2024-01-27: qty 0.5

## 2024-01-27 MED ORDER — OXYCODONE HCL 5 MG/5ML PO SOLN: 5.0000 mg | Freq: Once | ORAL | Status: AC | PRN

## 2024-01-27 MED ORDER — ONDANSETRON 4 MG PO TBDP
4.0000 mg | ORAL_TABLET | Freq: Four times a day (QID) | ORAL | Status: DC | PRN
Start: 2024-01-27 — End: 2024-01-28
  Administered 2024-01-28: 4 mg via ORAL
  Filled 2024-01-27 (×3): qty 1

## 2024-01-27 MED ORDER — DEXAMETHASONE SODIUM PHOSPHATE 10 MG/ML IJ SOLN
INTRAMUSCULAR | Status: AC
  Filled 2024-01-27: qty 5

## 2024-01-27 MED ORDER — ACETAMINOPHEN 10 MG/ML IV SOLN: 1000.0000 mg | Freq: Once | INTRAVENOUS | Status: DC | PRN

## 2024-01-27 MED ORDER — DEXAMETHASONE SODIUM PHOSPHATE 10 MG/ML IJ SOLN
INTRAMUSCULAR | Status: DC | PRN
  Administered 2024-01-27: 8 mg via INTRAVENOUS

## 2024-01-27 MED ORDER — SUCRALFATE 1 G PO TABS
1.0000 g | ORAL_TABLET | Freq: Three times a day (TID) | ORAL | Status: DC
  Administered 2024-01-27 – 2024-01-28 (×3): 1 g via ORAL
  Filled 2024-01-27 (×3): qty 1

## 2024-01-27 MED ORDER — ATENOLOL 25 MG PO TABS
12.5000 mg | ORAL_TABLET | Freq: Every day | ORAL | Status: DC
  Administered 2024-01-27 – 2024-01-28 (×2): 12.5 mg via ORAL
  Filled 2024-01-27 (×2): qty 1

## 2024-01-27 MED ORDER — PROPOFOL 10 MG/ML IV BOLUS
INTRAVENOUS | Status: DC | PRN
Start: 2024-01-27 — End: 2024-01-27
  Administered 2024-01-27: 50 ug via INTRAVENOUS

## 2024-01-27 MED ORDER — BUPIVACAINE LIPOSOME 1.3 % IJ SUSP
INTRAMUSCULAR | Status: DC | PRN
  Administered 2024-01-27: 20 mL

## 2024-01-27 MED ORDER — BUPIVACAINE-EPINEPHRINE (PF) 0.25% -1:200000 IJ SOLN
INTRAMUSCULAR | Status: AC
  Filled 2024-01-27: qty 30

## 2024-01-27 MED ORDER — OXYCODONE-ACETAMINOPHEN 5-325 MG PO TABS
1.0000 | ORAL_TABLET | ORAL | Status: DC | PRN
  Administered 2024-01-28 (×3): 1 via ORAL
  Filled 2024-01-27 (×2): qty 1
  Filled 2024-01-27: qty 2

## 2024-01-27 MED ORDER — BUPIVACAINE LIPOSOME 1.3 % IJ SUSP
INTRAMUSCULAR | Status: AC
  Filled 2024-01-27: qty 20

## 2024-01-27 MED ORDER — DIPHENHYDRAMINE HCL 50 MG/ML IJ SOLN: 12.5000 mg | Freq: Four times a day (QID) | INTRAMUSCULAR | Status: DC | PRN

## 2024-01-27 MED ORDER — ENSURE ENLIVE PO LIQD
237.0000 mL | Freq: Two times a day (BID) | ORAL | Status: DC
  Administered 2024-01-28 (×2): 237 mL via ORAL

## 2024-01-27 MED ORDER — ONDANSETRON HCL 4 MG/2ML IJ SOLN
INTRAMUSCULAR | Status: AC
  Filled 2024-01-27: qty 2

## 2024-01-27 MED ORDER — THIAMINE HCL 100 MG PO TABS
100.0000 mg | ORAL_TABLET | Freq: Every day | ORAL | Status: DC
  Administered 2024-01-28: 100 mg via ORAL
  Filled 2024-01-27 (×2): qty 1

## 2024-01-27 MED ORDER — HYDRALAZINE HCL 20 MG/ML IJ SOLN: 10.0000 mg | INTRAMUSCULAR | Status: DC | PRN

## 2024-01-27 MED ORDER — IOHEXOL 300 MG/ML  SOLN
100.0000 mL | Freq: Once | INTRAMUSCULAR | Status: AC | PRN
  Administered 2024-01-27: 100 mL via INTRAVENOUS

## 2024-01-27 SURGICAL SUPPLY — 37 items
BLADE CLIPPER SURG (BLADE) ×1 IMPLANT
COVER TIP SHEARS 8 DVNC (MISCELLANEOUS) ×1 IMPLANT
COVER WAND RF STERILE (DRAPES) ×1 IMPLANT
DERMABOND ADVANCED .7 DNX12 (GAUZE/BANDAGES/DRESSINGS) ×1 IMPLANT
DRAPE ARM DVNC X/XI (DISPOSABLE) ×3 IMPLANT
DRAPE COLUMN DVNC XI (DISPOSABLE) ×1 IMPLANT
DRAPE UTILITY 15X26 TOWEL STRL (DRAPES) ×1 IMPLANT
ELECT REM PT RETURN 9FT ADLT (ELECTROSURGICAL) ×1 IMPLANT
ELECTRODE REM PT RTRN 9FT ADLT (ELECTROSURGICAL) ×1 IMPLANT
FORCEPS BPLR R/ABLATION 8 DVNC (INSTRUMENTS) ×1 IMPLANT
GLOVE ORTHO TXT STRL SZ7.5 (GLOVE) ×3 IMPLANT
GOWN STRL REUS W/ TWL LRG LVL3 (GOWN DISPOSABLE) ×1 IMPLANT
GOWN STRL REUS W/ TWL XL LVL3 (GOWN DISPOSABLE) ×2 IMPLANT
KIT PINK PAD W/HEAD ARE REST (MISCELLANEOUS) ×1 IMPLANT
KIT PINK PAD W/HEAD ARM REST (MISCELLANEOUS) ×1 IMPLANT
LABEL OR SOLS (LABEL) ×1 IMPLANT
MANIFOLD NEPTUNE II (INSTRUMENTS) ×1 IMPLANT
MESH 3DMAX LIGHT 4.1X6.2 LT LR (Mesh General) IMPLANT
NDL DRIVE SUT CUT DVNC (INSTRUMENTS) ×1 IMPLANT
NDL HYPO 22X1.5 SAFETY MO (MISCELLANEOUS) ×1 IMPLANT
NDL INSUFFLATION 14GA 120MM (NEEDLE) IMPLANT
NEEDLE DRIVE SUT CUT DVNC (INSTRUMENTS) ×1 IMPLANT
NEEDLE HYPO 22X1.5 SAFETY MO (MISCELLANEOUS) ×1 IMPLANT
NEEDLE INSUFFLATION 14GA 120MM (NEEDLE) ×1 IMPLANT
PACK LAP CHOLECYSTECTOMY (MISCELLANEOUS) ×1 IMPLANT
SCISSORS MNPLR CVD DVNC XI (INSTRUMENTS) ×1 IMPLANT
SEAL UNIV 5-12 XI (MISCELLANEOUS) ×3 IMPLANT
SET TUBE SMOKE EVAC HIGH FLOW (TUBING) ×1 IMPLANT
SOL ELECTROSURG ANTI STICK (MISCELLANEOUS) ×1 IMPLANT
SOLUTION ELECTROSURG ANTI STCK (MISCELLANEOUS) ×1 IMPLANT
SUT MNCRL 4-0 27 PS-2 XMFL (SUTURE) ×1 IMPLANT
SUT STRATA 2-0 23CM CT-2 (SUTURE) IMPLANT
SUT STRATA 3-0 SH (SUTURE) IMPLANT
SUT VIC AB 2-0 SH 27XBRD (SUTURE) ×1 IMPLANT
SUTURE MNCRL 4-0 27XMF (SUTURE) ×1 IMPLANT
TRAP FLUID SMOKE EVACUATOR (MISCELLANEOUS) ×1 IMPLANT
WATER STERILE IRR 500ML POUR (IV SOLUTION) ×1 IMPLANT

## 2024-01-27 NOTE — Op Note (Signed)
 Robotic assisted Laparoscopic Transabdominal left incarcerated inguinal Hernia Repair with Mesh       Pre-operative Diagnosis: Left incarcerated inguinal Hernia (fully reduced after induction of anesthesia)   Post-operative Diagnosis: Same   Procedure: Robotic assisted Laparoscopic  repair of initial left inguinal hernia(s)   Surgeon: Campbell Lerner, M.D., FACS   Anesthesia: GETA   Findings: Large left direct inguinal hernia,  evidence of smaller right indirect sided inguinal hernia.   Consideration of bilateral repaired intraoperatively, unfortunately due to patient's right arm contracture, unable to access this area with robotic instrumentation to complete/initiate repair.  Felt prudent to defer, until symptomatic, and then likely pursue open repair or nonrobotic/laparoscopic repair.   Procedure Details  The patient was seen again in the Holding Room. The benefits, complications, treatment options, and expected outcomes were discussed with the patient. The risks of bleeding, infection, recurrence of symptoms, failure to resolve symptoms, recurrence of hernia, ischemic orchitis, chronic pain syndrome or neuroma, were reviewed again. The likelihood of improving the patient's symptoms with return to their baseline status is good.  The patient and/or family concurred with the proposed plan, giving informed consent.  The patient was taken to Operating Room, identified  and the procedure verified as Laparoscopic Inguinal Hernia Repair. Laterality confirmed.  A Time Out was held and the above information confirmed.   Prior to the induction of general anesthesia, antibiotic prophylaxis was administered. VTE prophylaxis was in place. General endotracheal anesthesia was then administered and tolerated well. After the induction, the abdomen was prepped with Chloraprep and draped in the sterile fashion. The patient was positioned in the supine position.   After local infiltration of quarter percent  Marcaine with epinephrine, stab incision was made left upper quadrant.  On the left at Palmer's point, the Veress needle is passed with sensation of the layers to penetrate the abdominal wall and into the peritoneum.  Saline drop test is confirmed peritoneal placement.  Insufflation is initiated with carbon dioxide to pressures of 15 mmHg. An 8.5 mm port is placed to the left off of the midline, with blunt tipped trocar.  Pneumoperitoneum maintained w/o HD changes to pressures of 15 mm Hg with CO2. No evidence of bowel injuries.  Two 8.5 mm ports placed under direct vision in each upper quadrant. The laparoscopy revealed a a right indirect defect(s), and a large direct left sided inguinal defect. The robot was brought to the table and docked in the standard fashion, no collision between arms was observed. Instruments were kept under direct view at all times. For left inguinal hernia repair,  I developed a peritoneal flap. The sac(s) were reduced and dissected free from adjacent structures. We preserved the vas and the vessels, and visualized them to their convergence and beyond in the retroperitoneum.  The defect was approxi-4 cm in diameter.  I reduced the intraperitoneal pressure, and utilizing a 2-0 STRATAFIX, I utilized a combination of pursestring reduction and securing of the transversalis fascia to the medial aspect of Cooper's ligament. Once dissection was completed a left sided large  BARD 3D Light mesh was placed and secured at three points with interrupted 2-0 Vicryl to the pubic tubercle and anteriorly. There was good coverage of the direct, indirect and femoral spaces.  Second look revealed no complications or injuries.  The flap was then closed with 3-0 V-lock suture.  Peritoneal closure without defects.  Once assuring that hemostasis was adequate, all needles/sponges removed, and the robot was undocked.  Under direct visualization I placed the  Veress needle into the preperitoneal space  the Veress' valve was released allowing extraperitoneal CO2 to escape, it was also used to access the space for supplemental local anesthesia. The ports were removed, the abdomen desulflated.  4-0 subcuticular Monocryl was used at all skin edges. Dermabond was placed.  Patient tolerated the procedure well. There were no complications. He was taken to the recovery room in stable condition.           Campbell Lerner, M.D., FACS 01/27/2024, 6:05 PM

## 2024-01-27 NOTE — ED Triage Notes (Signed)
 First Nurse Note: Patient to ED via [facility] from [facility] for groin pain. Had known hernia there. Also having some constipation since yesterday.
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
## 2024-01-27 NOTE — ED Notes (Signed)
 Blood hemolyzed per lab, phlebotomy at bedside to recollect.

## 2024-01-27 NOTE — ED Notes (Signed)
 Missed IV attempt to [facility], will contact us guided IV RN.

## 2024-01-27 NOTE — ED Provider Notes (Signed)
 [facility] Provider Note    Event Date/Time   First MD Initiated Contact with Patient 01/27/24 1122     (approximate)   History   Groin Pain   HPI  Douglas Peterson. is a 73 year old male with history of alcohol intoxication, HTN, paroxysmal A-fib presenting to the emergency department for evaluation of groin pain from [facility].  Patient reports longstanding left groin hernia that is occasionally painful, but usually able to be reduced.  Last night, he noticed pain and swelling in the area that has been persistent.  Denies nausea or vomiting, but does report that he has not had a bowel movement since yesterday.  No fevers or chills.      Physical Exam   Triage Vital Signs: ED Triage Vitals  Encounter Vitals Group     BP 01/27/24 1047 (!) 164/86     Systolic BP Percentile --      Diastolic BP Percentile --      Pulse Rate 01/27/24 1047 67     Resp 01/27/24 1047 16     Temp 01/27/24 1047 98.1 F (36.7 C)     Temp Source 01/27/24 1047 Oral     SpO2 01/27/24 1047 97 %     Weight 01/27/24 1046 128 lb (58.1 kg)     Height 01/27/24 1046 5\' 4"  (1.626 m)     Head Circumference --      [facility] --      Pain Score 01/27/24 1046 10     Pain Loc --      Pain Education --      Exclude from Growth Chart --     Most recent vital signs: Vitals:   01/27/24 1300 01/27/24 1511  BP: (!) 135/116   Pulse: 68   Resp:    Temp:  97.6 F (36.4 C)  SpO2: 97%      General: Awake, interactive  CV:  Regular rate, good peripheral perfusion.  Resp:  Unlabored respirations, lungs clear to auscultation Abd:  Nondistended, upper abdomen is soft, there is a prominent bulge in the left inguinal area consistent with a hernia.  There is some faint overlying erythema.  There is significant tenderness to palpation in this area and it is not able to be readily reduced Neuro:  Symmetric facial movement, fluid speech   ED Results / Procedures / Treatments    Labs (all labs ordered are listed, but only abnormal results are displayed) Labs Reviewed  COMPREHENSIVE METABOLIC PANEL - Abnormal; Notable for the following components:      Result Value   Creatinine, Ser 0.52 (*)    Calcium 8.5 (*)    All other components within normal limits  CBC WITH DIFFERENTIAL/PLATELET  LIPASE, BLOOD  CBC WITH DIFFERENTIAL/PLATELET     EKG EKG independently reviewed interpreted by myself (ER attending) demonstrates:    RADIOLOGY Imaging independently reviewed and interpreted by myself demonstrates:  CT abdomen pelvis with left-sided hernia consistent with physical exam, some air-fluid level noted  PROCEDURES:  Critical Care performed: No  Procedures   MEDICATIONS ORDERED IN ED: Medications  HYDROmorphone (DILAUDID) injection 0.5 mg (0.5 mg Intravenous Given 01/27/24 1336)  iohexol (OMNIPAQUE) 300 MG/ML solution 100 mL (100 mLs Intravenous Contrast Given 01/27/24 1449)  HYDROmorphone (DILAUDID) injection 0.5 mg (0.5 mg Intravenous Given 01/27/24 1514)     IMPRESSION / MDM / ASSESSMENT AND PLAN / ED COURSE  I reviewed the triage vital signs and the nursing notes.  Differential  diagnosis includes, but is not limited to, incarcerated or strangulated hernia, small bowel obstruction, other acute intra-abdominal process  Patient's presentation is most consistent with acute presentation with potential threat to life or bodily function.  73 year old male presenting with groin pain found to have irreducible hernia on exam.  Ordered for pain medication and placed in Trendelenburg.  On reevaluation, reports improved pain, but still has significant discomfort and I am unable to reduce his hernia.  CT pending.  Anticipate will require surgery consultation pending CT results.  3:15 PM Formal read on CT pending, but does demonstrate bowel containing hernia on my review.  Reviewed with Dr. Claudine Mouton.  He will read the patient CT and evaluate the patient.      FINAL CLINICAL IMPRESSION(S) / ED DIAGNOSES   Final diagnoses:  Incarcerated hernia     Rx / DC Orders   ED Discharge Orders     None        Note:  This document was prepared using Dragon voice recognition software and may include unintentional dictation errors.   Trinna Post, MD 01/27/24 [phone]

## 2024-01-27 NOTE — Anesthesia Procedure Notes (Signed)
 Procedure Name: Intubation Date/Time: 01/27/2024 4:40 PM  Performed by: Otho Perl, CRNAPre-anesthesia Checklist: Patient identified, Patient being monitored, Timeout performed, Emergency Drugs available and Suction available Patient Re-evaluated:Patient Re-evaluated prior to induction Oxygen Delivery Method: Circle system utilized Preoxygenation: Pre-oxygenation with 100% oxygen Induction Type: IV induction Ventilation: Mask ventilation without difficulty Laryngoscope Size: 3 and McGrath Grade View: Grade I Tube type: Oral Tube size: 7.0 mm Number of attempts: 1 Airway Equipment and Method: Stylet Placement Confirmation: ETT inserted through vocal cords under direct vision, positive ETCO2 and breath sounds checked- equal and bilateral Secured at: 19 cm Tube secured with: Tape Dental Injury: Teeth and Oropharynx as per pre-operative assessment

## 2024-01-27 NOTE — Transfer of Care (Signed)
 Immediate Anesthesia Transfer of Care Note  Patient: Douglas Peterson.  Procedure(s) Performed: REPAIR, HERNIA, INGUINAL, ROBOT-ASSISTED, LAPAROSCOPIC, USING MESH (Left)  Patient Location: PACU  Anesthesia Type:General  Level of Consciousness: drowsy and responds to stimulation  Airway & Oxygen Therapy: Patient Spontanous Breathing and Patient connected to face mask oxygen  Post-op Assessment: Report given to RN and Post -op Vital signs reviewed and unstable, Anesthesiologist notified  Post vital signs: Reviewed and stable  Last Vitals:  Vitals Value Taken Time  BP 143/67   Temp 97   Pulse 55 01/27/24 1829  Resp 8 01/27/24 1829  SpO2 100 % 01/27/24 1829  Vitals shown include unfiled device data.  Last Pain:  Vitals:   01/27/24 1612  TempSrc: Temporal  PainSc: 0-No pain         Complications: No notable events documented.

## 2024-01-28 ENCOUNTER — Encounter: Payer: Self-pay | Admitting: Surgery

## 2024-01-28 ENCOUNTER — Other Ambulatory Visit: Payer: Self-pay | Admitting: Surgery

## 2024-01-28 MED ORDER — OXYCODONE-ACETAMINOPHEN 5-325 MG PO TABS: 1.0000 | ORAL_TABLET | Freq: Four times a day (QID) | ORAL | 0 refills | Status: DC | PRN

## 2024-01-28 NOTE — Plan of Care (Signed)

## 2024-01-28 NOTE — TOC Transition Note (Signed)
 Transition of Care [facility]) - Discharge Note   Patient Details  Name: Douglas Peterson. MRN: [id number] Date of Birth: 1951/05/22  [facility] of [facility] CM/SW Contact:  Margarito Liner, LCSW Phone Number: 01/28/2024, 11:46 AM   Clinical Narrative:  Patient has orders to discharge back to [facility] today. Patient confirmed plan. Admissions coordinator checked with staff at facility and confirmed patient does not need a new insurance authorization or FL2. RN has already called report. [facility] has been arranged and he is 3rd on the list. No further concerns. CSW signing off.   Final next level of care: Skilled Nursing Facility Barriers to Discharge: No Barriers Identified   Patient Goals and CMS Choice            Discharge Placement                Patient to be transferred to facility by: [facility]   Patient and family notified of of transfer: 01/28/24  Discharge Plan and Services Additional resources added to the After Visit Summary for                                       Social Drivers of Health (SDOH) Interventions SDOH Screenings   Food Insecurity: No Food Insecurity (01/27/2024)  Housing: High Risk (01/27/2024)  Transportation Needs: No Transportation Needs (01/27/2024)  Utilities: Not At Risk (01/27/2024)  Financial Resource Strain: Unknown (06/13/2019)  Physical Activity: Unknown (06/13/2019)  Social Connections: Socially Isolated (01/27/2024)  Stress: Unknown (06/13/2019)  Tobacco Use: Medium Risk (01/27/2024)     Readmission Risk Interventions    01/07/2024   11:31 AM 06/17/2023    4:23 PM 04/29/2022   10:26 AM  Readmission Risk Prevention Plan  Transportation Screening Complete Complete Complete  HRI or Home Care Consult  Complete Complete  Social Work Consult for Recovery Care Planning/Counseling  Complete Complete  Palliative Care Screening  Not Applicable Not Applicable  Medication Review Special educational needs teacher) Complete Complete Complete  PCP or Specialist appointment within 3-5 days of discharge Complete    HRI or Home Care Consult Complete    SW Recovery Care/Counseling Consult Complete    Palliative Care Screening Not Applicable    Skilled Nursing Facility Not Applicable

## 2024-01-28 NOTE — Discharge Instructions (Addendum)
 In addition to included general post-operative instructions,  Diet: Resume home diet.   Activity: No heavy lifting >20 pounds (children, pets, laundry, garbage) or strenuous activity for 6 weeks, but light activity and walking are encouraged. Do not drive or drink alcohol if taking narcotic pain medications or having pain that might distract from driving. Okay to work with therapies.   Wound care: 2 days after surgery (03/28), you may shower/get incision wet with soapy water and pat dry (do not rub incisions), but no baths or submerging incision underwater until follow-up.   Medications: Resume all home medications. For mild to moderate pain: acetaminophen (Tylenol) or ibuprofen/naproxen (if no kidney disease). Combining Tylenol with alcohol can substantially increase your risk of causing liver disease. Narcotic pain medications, if prescribed, can be used for severe pain, though may cause nausea, constipation, and drowsiness. Do not combine Tylenol and Percocet (or similar) within a 6 hour period as Percocet (and similar) contain(s) Tylenol. If you do not need the narcotic pain medication, you do not need to fill the prescription.  Call office [phone] / [phone]) at any time if any questions, worsening pain, fevers/chills, bleeding, drainage from incision site, or other concerns.   Rent/Utility/Housing  Agency Name: [facility] Address: [address] [place], [place] [address] Phone: [phone] Email: [email or web address] Website: [email or web address] Service(s) Offered: Housing services, self-sufficiency, congregate meal program, weatherization program, Field seismologist program, emergency food assistance,  housing counseling, home ownership program, wheels -towork program.  Agency Name: [facility] Address: [address] [place], [place] [address] Phone: [phone] (8a-4p) [phone] (8p- 10p) Email:  [email or web address] Website: [email or web address] Service(s) Offered: A program for homeless and/or needy men that includes one-on-one counseling, life skills training and job rehabilitation.  Agency Name: [facility] Address: [address] [place], [place] [address] Phone: [phone] Website: www.alliedchurches.org Service(s) Offered: Assistance to needy in emergency with utility bills, heating fuel, and prescriptions. Shelter for homeless 7pm-7am. February 26, 2017 15  Agency Name: [facility] [place] [facility] Disabled) Address: [address], [place], [place] [address] Phone: [phone] Contact Person: Cathleen Corti Email: [email or web address] Website: [email or web address] Service(s) Offered: Helps individuals with developmental disabilities move from housing that is more restrictive to homes where they  can achieve greater independence and have more  opportunities.  Agency Name: [facility] Address: [address], [place], [place] [address] Phone: [phone] Email: [email or web address] Website: www.[email or web address].org Service(s) Offered: Provides affordable housing for low-income families, elderly, and disabled individuals. Offer a wide range of  programs and services, from financial planning to afterschool and summer programs.  Agency Name: [facility] Address: [address] [place], [place] [address] Phone: [phone] Service(s) Offered: Child support services; child welfare services; food stamps; Medicaid; work first family assistance; and aid with fuel,  rent, food and medicine.  Agency Name: [facility] [place] [employer] Address: [facility] [address] [place], [place]  [address] Phone: [phone] Website: www.familyabuseservices.org Service(s) Offered: 24 hour Crisis Line: 8031436937; 24 hour Emergency Shelter; Transitional Housing;  Support Groups; Scientist, physiological; [employer]; Hispanic Outreach: [phone];  Visitation Center: [phone].  Agency Name: [facility] [place]. Address: [address] [place], [place] [address] Phone: [phone] Service(s) Offered: CAP Services; Home and [employer]; Individual or Group Supports; Respite Care Non-Institutional Nursing;  Residential Supports; Respite Care and Personal Care Services; Transportation; Family and Friends Night; Recreational Activities; Three Nutritious Meals/Snacks; Consultation with Registered Dietician; Twenty-four hour Registered Nurse  Access; Daily and [employer]; [place]; [place] for the [employer] (During Summer Months) Bingo Night (Every  Wednesday Night); Special Populations Dance Night  (Every Tuesday Night); Professional Hair Care Services.  Agency Name: [facility] Address: [address], [place], [place] [address] Phone: [phone] Contact Person: Jabier Mutton Website: [email or web address].Physicist, medical) Offered: Residential treatment facility for women; food and  clothing, educational & employment development and  transportation to work; Counsellor of financial skills;  parenting and family reunification; emotional and spiritual  support; transitional housing for program graduates.  Agency Name: [facility] Address: [address] [place], [place] [address] Phone: [phone] Email: [email or web address] Website: [email or web address].es Service(s) Offered: Public housing units for elderly, disabled, and low income people; housing choice vouchers for income eligible  applicants; shelter plus care vouchers; and Psychologist, clinical.  Agency Name: [facility] [employer] Address: [address] [place], [place] [address] Phone: [phone] Email: [email or web address] Website: www.habitatalamance.org Service(s)  Offered: Build houses for families in need of decent housing. Each adult in the family must invest 200 hours of labor on  someone else's house, work with volunteers to build their own house, attend classes on budgeting, home maintenance, yard care, and attend homeowner association meetings.  Agency Name: [facility] Address: [address], [place], [place] [address] Phone: [phone] Website: www.rsli.org Service(s) Offered: Intermediate care facilities for intellectually delayed, Supervised Living in group homes for adults with developmental disabilities, Supervised Living for people who have dual diagnoses (MRMI), Independent Living, Supported Living, respite and a variety of CAP services, pre-vocational services, day supports, and [employer].  Agency Name: [place] [employer] Phone: [phone] Website: www.NCForeclosurePrevention.gov Service(s) Offered: Zero-interest, deferred loans to homeowners struggling to pay their mortgage. Call for more information.    the Institute on Aging offers a [employer] that anyone can call toll free at [phone]. The friendship line is available 24 hours a day  [employer] is a Program of All-inclusive Care for the Elderly (PACE). Their mission is to promote and sustain the independence of seniors wishing to remain in the community. They provide seniors with comprehensive long-term health, social, medical and dietary care. Their program is a safe alternative to nursing home care. [phone]  [facility] Physical Address [facility] [address] [place], [place] [address] Phone: [phone]. . Online zoom yoga class, connect with others without leaving your home [facility] offers [place] dance cardio sessions for individuals via Zoom. This program provides: - Dance fitness activities Please contact program for more information. Servinganyone in  need adults 18+ hiv/aids individuals families Call [phone]  Email [email or web address] [email or web address] to get more info  [employer] offers an online [employer] to individuals where they can receive help to focus on their best health. Whether you're a [employer] member or not, the neighborhood center offers a... Main Serviceshealth education  exercise & fitness  community support services  recreation  virtual support Other Servicessupport groups Servinganyone in need adults young adults teens seniors individuals families [email or web address] to get more info  Schedule on their website  The [facility] offers an array of activities for adults age 70 and over. This program provides:- Fitness and health programs- Tech classes- Activity books Main Serviceshealth education  community support services  exercise & fitness  recreation  more education Servingseniors  Call [phone]    For more resources go online to [email or web address].co.uk and type in  you zipcode

## 2024-01-28 NOTE — Discharge Summary (Signed)
 [facility] SURGICAL DISCHARGE SUMMARY  Patient ID: Douglas Peterson. MRN: [id number] DOB/AGE: 73/25/1952 34 y.o.  Admit date: 01/27/2024 Discharge date: 01/28/2024  Discharge Diagnoses Patient Active Problem List   Diagnosis Date Noted   Incarcerated hernia 01/27/2024   S/P laparoscopic hernia repair 01/27/2024   Intractable nausea and vomiting 10/06/2023    Consultants None  Procedures 01/27/2024:  Robotic assisted laparoscopic left inguinal hernia repair   HPI: Douglas Loyal. is a 73 y.o. male with longstanding known left inguinal hernia, previously able to reduce.  Pain and swelling in the area since yesterday.  Reports constipation, denies nausea or vomiting.  Denies fevers and chills.  Dr. Trinna Post reported an attempt at reduction, with medication in Trendelenburg positioning.  She did not report success, or partial success. CT scan confirms presence of bowel within hernia sac.  Hospital Course: Informed consent was obtained and documented, and patient underwent uneventful robotic assisted laparoscopic left inguinal hernia repair (Dr Claudine Mouton, 01/27/2024).  Post-operatively, patient did very well. Advancement of patient's diet and ambulation were well-tolerated. The remainder of patient's hospital course was essentially unremarkable, and discharge planning was initiated accordingly with patient safely able to be discharged home with appropriate discharge instructions, pain control, and outpatient follow-up after all of his questions were answered to his expressed satisfaction.   Discharge Condition: Good   Physical Examination:  Constitutional: Well appearing male, NAD Pulmonary: Normal effort, no respiratory distress Gastrointestinal: Soft, non-tender, non-distended, no rebound/guarding. Left groin soft Skin: Laparoscopic incisions are CDI with dermabond, no erythema or drainage    Allergies as of 01/28/2024       Reactions   Other Anaphylaxis    strawberries   Hydrochlorothiazide Other (See Comments)   Hyponatremia        Medication List     TAKE these medications    acetaminophen 500 MG tablet Commonly known as: TYLENOL Take 2 tablets (1,000 mg total) by mouth every 8 (eight) hours as needed for mild pain or moderate pain.   amLODipine 5 MG tablet Commonly known as: NORVASC Take 1 tablet (5 mg total) by mouth daily.   atenolol 25 MG tablet Commonly known as: TENORMIN Take 0.5 tablets (12.5 mg total) by mouth daily. What changed: how much to take   atorvastatin 10 MG tablet Commonly known as: LIPITOR Take 1 tablet (10 mg total) by mouth daily.   Baclofen 5 MG Tabs Take 1 tablet (5 mg total) by mouth 3 (three) times daily.   feeding supplement Liqd Take 237 mLs by mouth 2 (two) times daily between meals.   folic acid 1 MG tablet Commonly known as: FOLVITE Take 1 tablet (1 mg total) by mouth daily.   melatonin 5 MG Tabs Take 1 tablet (5 mg total) by mouth at bedtime.   multivitamin with minerals Tabs tablet Take 1 tablet by mouth daily.   ondansetron 4 MG tablet Commonly known as: ZOFRAN Take 1 tablet (4 mg total) by mouth every 6 (six) hours as needed for nausea.   oxyCODONE-acetaminophen 5-325 MG tablet Commonly known as: PERCOCET/ROXICET Take 1 tablet by mouth every 6 (six) hours as needed for severe pain (pain score 7-10).   pantoprazole 40 MG tablet Commonly known as: PROTONIX Take 1 tablet (40 mg total) by mouth daily.   polyethylene glycol 17 g packet Commonly known as: MIRALAX / GLYCOLAX Take 17 g by mouth daily as needed. Mix one tablespoon with 8oz of your favorite juice or water every day until you are  having soft formed stools. Then start taking once daily if you didn't have a stool the day before.   sucralfate 1 g tablet Commonly known as: CARAFATE Take 1 tablet (1 g total) by mouth 4 (four) times daily -  with meals and at bedtime.   thiamine 100 MG tablet Commonly known as:  VITAMIN B1 Take 1 tablet (100 mg total) by mouth daily.          Follow-up Information     Campbell Lerner, MD. Go on 02/11/2024.   Specialty: General Surgery Why: Go to appointment on 04/10 at 900 AM Contact information: [address] [place] [address] [phone]                  Time spent on discharge management including discussion of hospital course, clinical condition, outpatient instructions, prescriptions, and follow up with the patient and members of the medical team: >30 minutes  -- Lynden Oxford , PA-C [facility]  01/28/2024, 9:59 AM [phone] M-F: 7am - 4pm

## 2024-01-28 NOTE — Plan of Care (Signed)
  Problem: Education: Goal: Knowledge of General Education information will improve Description: Including pain rating scale, medication(s)/side effects and non-pharmacologic comfort measures 01/28/2024 0248 by Merrily Tegeler, Laurena Slimmer, RN Outcome: Progressing 01/28/2024 0247 by Jolean Madariaga, Laurena Slimmer, RN Outcome: Progressing   Problem: Health Behavior/Discharge Planning: Goal: Ability to manage health-related needs will improve 01/28/2024 0248 by Kiarra Kidd, Laurena Slimmer, RN Outcome: Progressing 01/28/2024 0247 by Richele Strand, Laurena Slimmer, RN Outcome: Progressing   Problem: Clinical Measurements: Goal: Ability to maintain clinical measurements within normal limits will improve 01/28/2024 0248 by Demi Trieu, Laurena Slimmer, RN Outcome: Progressing 01/28/2024 0247 by Elvenia Godden, Laurena Slimmer, RN Outcome: Progressing Goal: Will remain free from infection 01/28/2024 0248 by Anyla Israelson, Laurena Slimmer, RN Outcome: Progressing 01/28/2024 0247 by Shawntez Dickison, Laurena Slimmer, RN Outcome: Progressing Goal: Diagnostic test results will improve 01/28/2024 0248 by Freddie Nghiem, Laurena Slimmer, RN Outcome: Progressing 01/28/2024 0247 by Skarlette Lattner, Laurena Slimmer, RN Outcome: Progressing Goal: Respiratory complications will improve 01/28/2024 0248 by Jourdyn Hasler, Laurena Slimmer, RN Outcome: Progressing 01/28/2024 0247 by Koni Kannan, Laurena Slimmer, RN Outcome: Progressing Goal: Cardiovascular complication will be avoided 01/28/2024 0248 by Haneefah Venturini, Laurena Slimmer, RN Outcome: Progressing 01/28/2024 0247 by Rease Wence, Laurena Slimmer, RN Outcome: Progressing   Problem: Activity: Goal: Risk for activity intolerance will decrease 01/28/2024 0248 by Kalilah Barua, Laurena Slimmer, RN Outcome: Progressing 01/28/2024 0247 by Fonnie Crookshanks, Laurena Slimmer, RN Outcome: Progressing   Problem: Nutrition: Goal: Adequate nutrition will be maintained 01/28/2024 0248 by Damontre Millea, Laurena Slimmer, RN Outcome: Progressing 01/28/2024 0247 by Sundra Haddix, Laurena Slimmer, RN Outcome: Progressing   Problem: Coping: Goal: Level of anxiety will  decrease 01/28/2024 0248 by Briann Sarchet, Laurena Slimmer, RN Outcome: Progressing 01/28/2024 0247 by Whitnie Deleon, Laurena Slimmer, RN Outcome: Progressing   Problem: Elimination: Goal: Will not experience complications related to bowel motility 01/28/2024 0248 by Tonianne Fine, Laurena Slimmer, RN Outcome: Progressing 01/28/2024 0247 by Sherryll Skoczylas, Laurena Slimmer, RN Outcome: Progressing Goal: Will not experience complications related to urinary retention 01/28/2024 0248 by Angelle Isais, Laurena Slimmer, RN Outcome: Progressing 01/28/2024 0247 by Rafel Garde, Laurena Slimmer, RN Outcome: Progressing   Problem: Pain Managment: Goal: General experience of comfort will improve and/or be controlled 01/28/2024 0248 by Hayward Rylander, Laurena Slimmer, RN Outcome: Progressing 01/28/2024 0247 by Gertude Benito, Laurena Slimmer, RN Outcome: Progressing   Problem: Safety: Goal: Ability to remain free from injury will improve 01/28/2024 0248 by Mycah Mcdougall, Laurena Slimmer, RN Outcome: Progressing 01/28/2024 0247 by Windle Huebert, Laurena Slimmer, RN Outcome: Progressing   Problem: Skin Integrity: Goal: Risk for impaired skin integrity will decrease 01/28/2024 0248 by Dev Dhondt, Laurena Slimmer, RN Outcome: Progressing 01/28/2024 0247 by Aaniya Sterba, Laurena Slimmer, RN Outcome: Progressing

## 2024-01-29 NOTE — Anesthesia Postprocedure Evaluation (Signed)
 Anesthesia Post Note  Patient: Douglas Peterson.  Procedure(s) Performed: REPAIR, HERNIA, INGUINAL, ROBOT-ASSISTED, LAPAROSCOPIC, USING MESH (Left)  Patient location during evaluation: PACU Anesthesia Type: General Level of consciousness: awake and alert Pain management: pain level controlled Vital Signs Assessment: post-procedure vital signs reviewed and stable Respiratory status: spontaneous breathing, nonlabored ventilation and respiratory function stable Cardiovascular status: blood pressure returned to baseline and stable Postop Assessment: no apparent nausea or vomiting Anesthetic complications: no   No notable events documented.   Last Vitals:  Vitals:   01/28/24 0347 01/28/24 0900  BP: 112/68 116/60  Pulse: 67 63  Resp: 16 16  Temp: (!) 36.4 C (!) 36.1 C  SpO2: 96% 96%    Last Pain:  Vitals:   01/28/24 1241  TempSrc:   PainSc: Asleep                 Foye Deer

## 2024-02-11 ENCOUNTER — Encounter: Payer: Self-pay | Admitting: Surgery

## 2024-02-11 ENCOUNTER — Ambulatory Visit (INDEPENDENT_AMBULATORY_CARE_PROVIDER_SITE_OTHER): Admitting: Surgery

## 2024-02-11 VITALS — BP 158/94 | HR 86 | Temp 98.2°F | Ht 63.0 in | Wt 124.4 lb

## 2024-02-11 DIAGNOSIS — K4030 Unilateral inguinal hernia, with obstruction, without gangrene, not specified as recurrent: Secondary | ICD-10-CM

## 2024-02-11 DIAGNOSIS — Z8719 Personal history of other diseases of the digestive system: Secondary | ICD-10-CM

## 2024-02-11 DIAGNOSIS — Z09 Encounter for follow-up examination after completed treatment for conditions other than malignant neoplasm: Secondary | ICD-10-CM

## 2024-02-11 NOTE — Patient Instructions (Addendum)
 Follow-up with our office as needed. ? ?Please call and ask to speak with a nurse if you develop questions or concerns. ? ? ?GENERAL POST-OPERATIVE ?PATIENT INSTRUCTIONS  ? ?WOUND CARE INSTRUCTIONS:  Keep a dry clean dressing on the wound if there is drainage. The initial bandage may be removed after 24 hours.  Once the wound has quit draining you may leave it open to air.  If clothing rubs against the wound or causes irritation and the wound is not draining you may cover it with a dry dressing during the daytime.  Try to keep the wound dry and avoid ointments on the wound unless directed to do so.  If the wound becomes bright red and painful or starts to drain infected material that is not clear, please contact your physician immediately.  If the wound is mildly pink and has a thick firm ridge underneath it, this is normal, and is referred to as a healing ridge.  This will resolve over the next 4-6 weeks. ? ?BATHING: ?You may shower if you have been informed of this by your surgeon. However, Please do not submerge in a tub, hot tub, or pool until incisions are completely sealed or have been told by your surgeon that you may do so. ? ?DIET:  You may eat any foods that you can tolerate.  It is a good idea to eat a high fiber diet and take in plenty of fluids to prevent constipation.  If you do become constipated you may want to take a mild laxative or take ducolax tablets on a daily basis until your bowel habits are regular.  Constipation can be very uncomfortable, along with straining, after recent surgery. ? ?ACTIVITY:  You are encouraged to cough and deep breath or use your incentive spirometer if you were given one, every 15-30 minutes when awake.  This will help prevent respiratory complications and low grade fevers post-operatively if you had a general anesthetic.  You may want to hug a pillow when coughing and sneezing to add additional support to the surgical area, if you had abdominal or chest surgery, which  will decrease pain during these times.  You are encouraged to walk and engage in light activity for the next two weeks.  You should not lift more than 20 pounds for 6 weeks total after surgery as it could put you at increased risk for complications.  Twenty pounds is roughly equivalent to a plastic bag of groceries. At that time- Listen to your body when lifting, if you have pain when lifting, stop and then try again in a few days. Soreness after doing exercises or activities of daily living is normal as you get back in to your normal routine. ? ?MEDICATIONS:  Try to take narcotic medications and anti-inflammatory medications, such as tylenol, ibuprofen, naprosyn, etc., with food.  This will minimize stomach upset from the medication.  Should you develop nausea and vomiting from the pain medication, or develop a rash, please discontinue the medication and contact your physician.  You should not drive, make important decisions, or operate machinery when taking narcotic pain medication. ? ?SUNBLOCK ?Use sun block to incision area over the next year if this area will be exposed to sun. This helps decrease scarring and will allow you avoid a permanent darkened area over your incision. ? ?QUESTIONS:  Please feel free to call our office if you have any questions, and we will be glad to assist you. [phone] ? ? ?

## 2024-02-11 NOTE — Progress Notes (Signed)
 [facility] POST-OP OFFICE VISIT  02/11/2024  HPI: Douglas Peterson. is a 73 y.o. male had surgery on January 27, 2024, now s/p robotic assisted left inguinal hernia repair for incarcerated left inguinal hernia.  He presents today reporting a knot in the left groin.  Denies any pain, denies any change with Valsalva.  Reports normal bowel activity, denies fevers and chills.  Vital signs: BP (!) 158/94   Pulse 86   Temp 98.2 F (36.8 C)   Ht 5\' 3"  (1.6 m)   Wt 124 lb 6.4 oz (56.4 kg)   SpO2 97%   BMI 22.04 kg/m    Physical Exam: Constitutional: He appears well, comfortable, at his baseline. Abdomen: Incisions are clean dry and intact his abdomen is soft and nontender.  In his left groin there is a well-defined spherical mass approximately the 2 to 2-1/2 cm, this does not change with Valsalva, it is nontender.  It is consistent with pseudohernia.   Assessment/Plan: This is a 73 y.o. male status post significantly large left inguinal hernia repair, completed robotically on January 27, 2024.  Expected postoperative course, with pseudohernia present.  Patient Active Problem List   Diagnosis Date Noted   Incarcerated hernia 01/27/2024   S/P laparoscopic hernia repair 01/27/2024   Intractable nausea and vomiting 10/06/2023   Protein-calorie malnutrition, severe 08/20/2023   Ground-level fall 08/16/2023   GERD without esophagitis 08/06/2023   Nausea 07/19/2023   Pressure injury of skin 07/11/2023   Alcohol abuse 07/11/2023   Abdominal pain 06/16/2023   Elevated lactic acid level 06/16/2023   Chronic diastolic CHF (congestive heart failure) (HCC) 06/16/2023   Increased urinary frequency 06/08/2023   Diarrhea 06/08/2023   Upper respiratory infection 06/04/2023   Enteritis 06/02/2023   Polyuria 06/02/2023   Dyslipidemia 05/01/2023   Alcohol dependence (HCC) 05/01/2023   Maggot infestation feet 05/11/2022   Unable to care for self 05/11/2022   Debility 04/25/2022    Weakness 04/23/2022   Epistaxis 04/23/2022   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   Generalized weakness 03/18/2022   Nondisplaced fracture of shaft of left clavicle, initial encounter for closed fracture 03/18/2022   BPH (benign prostatic hyperplasia) 03/18/2022   Clavicle fracture 03/17/2022   Fall 03/17/2022   CAP (community acquired pneumonia) 03/13/2022   Altered mental status, unspecified 02/20/2022   Hypotension 02/19/2022   Dysphonia 08/13/2021   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   Paresthesia of right upper extremity 08/13/2021   SIRS (systemic inflammatory response syndrome) (HCC) 08/05/2020   GERD (gastroesophageal reflux disease) 06/05/2020   Alcohol use disorder, severe, dependence (HCC) 05/28/2020   Elevated LFTs 04/06/2020   Paroxysmal A-fib (HCC) 03/17/2020   Primary insomnia 02/06/2020   Left inguinal hernia 01/31/2020   Encounter for competency evaluation    Depression 01/17/2020   Pancytopenia (HCC)    Hypomagnesemia    Alcoholic cirrhosis of liver without ascites (HCC)    ETOH abuse    Alcohol intoxication (HCC)    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) 09/27/2019   Hypokalemia 09/27/2019   Dysphagia 09/27/2019   Alcohol withdrawal delirium, acute, hyperactive (HCC) 09/21/2019   Pressure injury of ankle, stage 1 08/15/2019   Palliative care by specialist    Closed fracture of right proximal humerus 03/19/2019   Vitamin D deficiency 01/11/2019   Osteoporosis 12/22/2018   Closed nondisplaced fracture of acromial process of right scapula with routine healing 11/15/2018   Compression fracture of L2 vertebra with routine healing 11/15/2018  Delirium tremens (HCC) 03/08/2018   Essential hypertension 09/16/2017   Encephalopathy, 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   Steatohepatitis 12/03/2016   Abnormal liver enzymes 06/17/2016   Hereditary hemochromatosis (HCC) 06/17/2016   Anxiety 07/16/2015   Hyponatremia 07/09/2015   H/O  traumatic brain injury 05/22/2014   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HCC) 05/22/2014    - I gave him some reassurances, I believe he understands and appreciates that.  I advised that we will follow him up in 2 months anticipating resolution of this in the interval.   Campbell Lerner M.D., [facility] 02/11/2024, 9:26 AM

## 2024-03-19 ENCOUNTER — Observation Stay
Admission: EM | Admit: 2024-03-19 | Discharge: 2024-03-25 | Disposition: A | Discharge status: Home Health | Attending: Emergency Medicine | Admitting: Emergency Medicine

## 2024-03-19 ENCOUNTER — Other Ambulatory Visit: Payer: Self-pay

## 2024-03-19 ENCOUNTER — Emergency Department

## 2024-03-19 ENCOUNTER — Encounter: Payer: Self-pay | Admitting: Internal Medicine

## 2024-03-19 DIAGNOSIS — F10939 Alcohol use, unspecified with withdrawal, unspecified: Secondary | ICD-10-CM | POA: Diagnosis present

## 2024-03-19 DIAGNOSIS — Z79899 Other long term (current) drug therapy: Secondary | ICD-10-CM | POA: Insufficient documentation

## 2024-03-19 DIAGNOSIS — M24541 Contracture, right hand: Secondary | ICD-10-CM | POA: Diagnosis not present

## 2024-03-19 DIAGNOSIS — E43 Unspecified severe protein-calorie malnutrition: Secondary | ICD-10-CM | POA: Diagnosis not present

## 2024-03-19 DIAGNOSIS — W19XXXA Unspecified fall, initial encounter: Principal | ICD-10-CM | POA: Insufficient documentation

## 2024-03-19 DIAGNOSIS — D696 Thrombocytopenia, unspecified: Secondary | ICD-10-CM | POA: Diagnosis not present

## 2024-03-19 DIAGNOSIS — F10930 Alcohol use, unspecified with withdrawal, uncomplicated: Secondary | ICD-10-CM

## 2024-03-19 DIAGNOSIS — F10239 Alcohol dependence with withdrawal, unspecified: Secondary | ICD-10-CM | POA: Diagnosis not present

## 2024-03-19 DIAGNOSIS — I480 Paroxysmal atrial fibrillation: Secondary | ICD-10-CM | POA: Diagnosis not present

## 2024-03-19 DIAGNOSIS — I10 Essential (primary) hypertension: Secondary | ICD-10-CM | POA: Insufficient documentation

## 2024-03-19 DIAGNOSIS — Z87891 Personal history of nicotine dependence: Secondary | ICD-10-CM | POA: Insufficient documentation

## 2024-03-19 DIAGNOSIS — F10931 Alcohol use, unspecified with withdrawal delirium: Secondary | ICD-10-CM | POA: Diagnosis present

## 2024-03-19 DIAGNOSIS — S0990XA Unspecified injury of head, initial encounter: Secondary | ICD-10-CM

## 2024-03-19 DIAGNOSIS — Y908 Blood alcohol level of 240 mg/100 ml or more: Secondary | ICD-10-CM | POA: Diagnosis not present

## 2024-03-19 DIAGNOSIS — Z87820 Personal history of traumatic brain injury: Secondary | ICD-10-CM | POA: Diagnosis not present

## 2024-03-19 DIAGNOSIS — R1310 Dysphagia, unspecified: Secondary | ICD-10-CM | POA: Diagnosis not present

## 2024-03-19 DIAGNOSIS — K7010 Alcoholic hepatitis without ascites: Secondary | ICD-10-CM | POA: Insufficient documentation

## 2024-03-19 DIAGNOSIS — F10929 Alcohol use, unspecified with intoxication, unspecified: Secondary | ICD-10-CM | POA: Diagnosis present

## 2024-03-19 LAB — COMPREHENSIVE METABOLIC PANEL WITH GFR
ALT: 49 U/L — ABNORMAL HIGH (ref 0–44)
AST: 89 U/L — ABNORMAL HIGH (ref 15–41)
Albumin: 3.9 g/dL (ref 3.5–5.0)
Alkaline Phosphatase: 115 U/L (ref 38–126)
Anion gap: 13 (ref 5–15)
BUN: <5 mg/dL — ABNORMAL LOW (ref 8–23)
CO2: 24 mmol/L (ref 22–32)
Calcium: 8.2 mg/dL — ABNORMAL LOW (ref 8.9–10.3)
Chloride: 97 mmol/L — ABNORMAL LOW (ref 98–111)
Creatinine, Ser: 0.37 mg/dL — ABNORMAL LOW (ref 0.61–1.24)
GFR, Estimated: >60 mL/min (ref >60)
Glucose, Bld: 101 mg/dL — ABNORMAL HIGH (ref 70–99)
Potassium: 3.5 mmol/L (ref 3.5–5.1)
Sodium: 134 mmol/L — ABNORMAL LOW (ref 135–145)
Total Bilirubin: 1.1 mg/dL (ref 0.0–1.2)
Total Protein: 7.2 g/dL (ref 6.5–8.1)

## 2024-03-19 LAB — CBC
HCT: 40.8 % (ref 39.0–52.0)
Hemoglobin: 14.4 g/dL (ref 13.0–17.0)
MCH: 31.2 pg (ref 26.0–34.0)
MCHC: 35.3 g/dL (ref 30.0–36.0)
MCV: 88.5 fL (ref 80.0–100.0)
Platelets: 77 10*3/uL — ABNORMAL LOW (ref 150–400)
RBC: 4.61 MIL/uL (ref 4.22–5.81)
RDW: 12 % (ref 11.5–15.5)
WBC: 7.4 10*3/uL (ref 4.0–10.5)
nRBC: 0 % (ref 0.0–0.2)

## 2024-03-19 LAB — ETHANOL: Alcohol, Ethyl (B): 298 mg/dL — ABNORMAL HIGH (ref <15)

## 2024-03-19 MED ORDER — LORAZEPAM 2 MG/ML IJ SOLN
0.0000 mg | Freq: Four times a day (QID) | INTRAMUSCULAR | Status: DC
Start: 2024-03-19 — End: 2024-03-21
  Administered 2024-03-20: 2 mg via INTRAVENOUS
  Filled 2024-03-19: qty 1

## 2024-03-19 MED ORDER — BACLOFEN 10 MG PO TABS
5.0000 mg | ORAL_TABLET | Freq: Three times a day (TID) | ORAL | Status: DC
  Administered 2024-03-19 – 2024-03-25 (×19): 5 mg via ORAL
  Filled 2024-03-19 (×19): qty 1

## 2024-03-19 MED ORDER — THIAMINE MONONITRATE 100 MG PO TABS
100.0000 mg | ORAL_TABLET | Freq: Every day | ORAL | Status: DC
  Administered 2024-03-20 – 2024-03-25 (×6): 100 mg via ORAL
  Filled 2024-03-19 (×6): qty 1

## 2024-03-19 MED ORDER — SUCRALFATE 1 G PO TABS
1.0000 g | ORAL_TABLET | Freq: Three times a day (TID) | ORAL | Status: DC
  Administered 2024-03-19 – 2024-03-25 (×23): 1 g via ORAL
  Filled 2024-03-19 (×24): qty 1

## 2024-03-19 MED ORDER — IBUPROFEN 400 MG PO TABS
400.0000 mg | ORAL_TABLET | Freq: Once | ORAL | Status: AC
  Administered 2024-03-19: 400 mg via ORAL
  Filled 2024-03-19: qty 1

## 2024-03-19 MED ORDER — THIAMINE HCL 100 MG/ML IJ SOLN
100.0000 mg | Freq: Every day | INTRAMUSCULAR | Status: DC
  Administered 2024-03-19: 100 mg via INTRAVENOUS
  Filled 2024-03-19 (×2): qty 2

## 2024-03-19 MED ORDER — OXYCODONE-ACETAMINOPHEN 5-325 MG PO TABS
1.0000 | ORAL_TABLET | Freq: Four times a day (QID) | ORAL | Status: DC | PRN
  Administered 2024-03-22 – 2024-03-25 (×3): 1 via ORAL
  Filled 2024-03-19 (×3): qty 1

## 2024-03-19 MED ORDER — LORAZEPAM 2 MG PO TABS
0.0000 mg | ORAL_TABLET | Freq: Two times a day (BID) | ORAL | Status: DC
Start: 2024-03-22 — End: 2024-03-24

## 2024-03-19 MED ORDER — MELATONIN 5 MG PO TABS
5.0000 mg | ORAL_TABLET | Freq: Every day | ORAL | Status: DC
  Administered 2024-03-19 – 2024-03-25 (×7): 5 mg via ORAL
  Filled 2024-03-19 (×7): qty 1

## 2024-03-19 MED ORDER — ONDANSETRON HCL 4 MG/2ML IJ SOLN
4.0000 mg | Freq: Four times a day (QID) | INTRAMUSCULAR | Status: DC | PRN
  Administered 2024-03-19: 4 mg via INTRAVENOUS
  Filled 2024-03-19: qty 2

## 2024-03-19 MED ORDER — LORAZEPAM 2 MG/ML IJ SOLN: 0.0000 mg | Freq: Two times a day (BID) | INTRAMUSCULAR | Status: DC

## 2024-03-19 MED ORDER — POLYETHYLENE GLYCOL 3350 17 G PO PACK: 17.0000 g | PACK | Freq: Every day | ORAL | Status: DC | PRN

## 2024-03-19 MED ORDER — ONDANSETRON HCL 4 MG PO TABS
4.0000 mg | ORAL_TABLET | Freq: Four times a day (QID) | ORAL | Status: DC | PRN
  Administered 2024-03-23: 4 mg via ORAL
  Filled 2024-03-19: qty 1

## 2024-03-19 MED ORDER — LORAZEPAM 2 MG PO TABS
0.0000 mg | ORAL_TABLET | Freq: Four times a day (QID) | ORAL | Status: DC
  Administered 2024-03-20: 1 mg via ORAL
  Filled 2024-03-19: qty 1

## 2024-03-19 MED ORDER — LORAZEPAM 2 MG/ML IJ SOLN
1.0000 mg | Freq: Once | INTRAMUSCULAR | Status: AC
  Administered 2024-03-19: 1 mg via INTRAVENOUS
  Filled 2024-03-19: qty 1

## 2024-03-19 MED ORDER — ENSURE ENLIVE PO LIQD
237.0000 mL | Freq: Two times a day (BID) | ORAL | Status: DC
  Administered 2024-03-20 – 2024-03-25 (×10): 237 mL via ORAL

## 2024-03-19 MED ORDER — AMLODIPINE BESYLATE 5 MG PO TABS
5.0000 mg | ORAL_TABLET | Freq: Every day | ORAL | Status: DC
  Administered 2024-03-19 – 2024-03-25 (×7): 5 mg via ORAL
  Filled 2024-03-19 (×7): qty 1

## 2024-03-19 MED ORDER — ACETAMINOPHEN 500 MG PO TABS: 1000.0000 mg | ORAL_TABLET | Freq: Three times a day (TID) | ORAL | Status: DC | PRN

## 2024-03-19 MED ORDER — PANTOPRAZOLE SODIUM 40 MG PO TBEC
40.0000 mg | DELAYED_RELEASE_TABLET | Freq: Every day | ORAL | Status: DC
  Administered 2024-03-20 – 2024-03-25 (×6): 40 mg via ORAL
  Filled 2024-03-19 (×6): qty 1

## 2024-03-19 MED ORDER — ACETAMINOPHEN 325 MG PO TABS: 650.0000 mg | ORAL_TABLET | Freq: Three times a day (TID) | ORAL | Status: DC | PRN

## 2024-03-19 MED ORDER — ATENOLOL 25 MG PO TABS
25.0000 mg | ORAL_TABLET | Freq: Every day | ORAL | Status: DC
  Administered 2024-03-19 – 2024-03-25 (×6): 25 mg via ORAL
  Filled 2024-03-19 (×7): qty 1

## 2024-03-19 MED ORDER — ATORVASTATIN CALCIUM 20 MG PO TABS
10.0000 mg | ORAL_TABLET | Freq: Every day | ORAL | Status: DC
  Administered 2024-03-20 – 2024-03-25 (×6): 10 mg via ORAL
  Filled 2024-03-19 (×6): qty 1

## 2024-03-19 NOTE — ED Provider Notes (Signed)
-----------------------------------------   11:51 AM on 03/19/2024 ----------------------------------------- Patient now clinically sober but unable to ambulate and no clear indication for admission at this time.  We will have him evaluated by PT and TOC.   Twilla Galea, MD 03/19/24 1151

## 2024-03-19 NOTE — ED Notes (Addendum)
 This RN attempted to help pt find a ride home, pt is being uncooperative. This RN called patient contacts - daughter did not answer the call, sister answered but is unable to come get pt due to living 2 hours away. Sister informed this RN that she does not know anyone else that would be willing or able to give pt a ride home.

## 2024-03-19 NOTE — ED Notes (Signed)
 Pt was soaked with urine on arrival - pt was changed and rash/redness/ excoriation noted to his groin, thighs/ legs.

## 2024-03-19 NOTE — ED Notes (Signed)
 Attempted to assiste patient to stand. Attempt failed. Patient was unable to stand full weight bearing. He was weak and was helped back to the bed after stating he felt he was falling.

## 2024-03-19 NOTE — ED Notes (Signed)
 Pt pending discharge to give time to metabolize alcohol. Pt sleeping. Fall risk bracelet on. Bed alarm on.

## 2024-03-19 NOTE — Discharge Instructions (Addendum)
 Please follow up with your primary doctor within 2 weeks to review your hospital stay and medications with them

## 2024-03-19 NOTE — ED Notes (Signed)
 This RN at bedside, call bell was alarming and pt saying "help." Pt states he felt like he was choking, pt was laying falt, sat pt up and the bed and pt states he felt better. Pt expressed that he is in pain and feels very anxious. Ace Abu, primary RN made aware at this time.

## 2024-03-19 NOTE — ED Notes (Signed)
 Brief, gown and pad changed at this time.

## 2024-03-19 NOTE — ED Triage Notes (Signed)
 Pt arrives via EMS from home where pt had a fall out of bed to the wheelchair - per EMS pt is a known heavy drinker pt endorses alcohol use tonight. Laceration noted to right forehead. Pt appears intoxicated - cooperative, following commands. VSS.

## 2024-03-19 NOTE — ED Provider Notes (Signed)
 [facility] Provider Note    Event Date/Time   First MD Initiated Contact with Patient 03/19/24 [phone]     (approximate)  History   Chief Complaint: Fall  HPI  Douglas Peterson. is a 73 y.o. male with a past medical history of alcohol abuse, hypertension, TBI with chronic right upper extremity weakness who presents to the emergency department after rolling out of bed.  According to the patient he does admit to alcohol use, states he fell out of his bed and hit his head on the wheelchair which was next to the bed.  Patient has a laceration just above the right eyebrow that is hemostatic.  Patient denies any other pain in any other extremity.  Physical Exam   Triage Vital Signs: ED Triage Vitals  Encounter Vitals Group     BP 03/19/24 0250 135/78     Systolic BP Percentile --      Diastolic BP Percentile --      Pulse Rate 03/19/24 0250 98     Resp 03/19/24 0250 18     Temp 03/19/24 0250 98.1 F (36.7 C)     Temp Source 03/19/24 0250 Oral     SpO2 03/19/24 0250 95 %     Weight 03/19/24 0251 127 lb 6.4 oz (57.8 kg)     Height 03/19/24 0251 5\' 3"  (1.6 m)     Head Circumference --      Peak Flow --      Pain Score 03/19/24 0251 8     Pain Loc --      Pain Education --      Exclude from Growth Chart --     Most recent vital signs: Vitals:   03/19/24 0250  BP: 135/78  Pulse: 98  Resp: 18  Temp: 98.1 F (36.7 C)  SpO2: 95%    General: Awake, no distress.  CV:  Good peripheral perfusion.  Regular rate and rhythm  Resp:  Normal effort.  Equal breath sounds bilaterally.  Abd:  No distention.  Soft, nontender.  No rebound or guarding. Other:  Small laceration to the right eyebrow hemostatic and not gaping.   ED Results / Procedures / Treatments   RADIOLOGY  I have reviewed interpreted CT head images.  No bleed seen on my evaluation. Radiology is read the CT scan as negative   MEDICATIONS ORDERED IN ED: Medications - No data to  display   IMPRESSION / MDM / ASSESSMENT AND PLAN / ED COURSE  I reviewed the triage vital signs and the nursing notes.  Patient's presentation is most consistent with acute presentation with potential threat to life or bodily function.  Patient presents the emergency department for fall out of bed hitting his head.  Overall the patient appears well, no distress.  Answering questions appropriately.  Does have a small laceration to the forehead.  We will check labs and obtain a CT scan of the head as a precaution.  Will clean the cut patient may require small amount of Dermabond but not gaping will not need suture.  Radiology is read the CT scan is negative.  Patient's lab work shows slight LFT elevation likely alcohol related otherwise reassuring chemistry.  CBC is normal.  Alcohol level of 298.  We will discharge once patient is able to arrange a ride home.  After cleaning the small laceration to the forehead and it appears to be more of a scrape, no repair needed.  FINAL CLINICAL IMPRESSION(S) / ED  DIAGNOSES   Head injury Alcohol use   Note:  This document was prepared using Dragon voice recognition software and may include unintentional dictation errors.   Ruth Cove, MD 03/19/24 [phone]
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
## 2024-03-19 NOTE — ED Notes (Signed)
 Patient cleaned up from urine on arrival. Patient placed in brief and warm blankets provided. While changing the patient, this RN and Gracie, RN noticed that the patient has large areas of redness and excoriation on bilateral hips, sacrum, and buttock area. Primary RN aware.

## 2024-03-20 DIAGNOSIS — F10930 Alcohol use, unspecified with withdrawal, uncomplicated: Secondary | ICD-10-CM | POA: Diagnosis not present

## 2024-03-20 LAB — CBC
HCT: 37.4 % — ABNORMAL LOW (ref 39.0–52.0)
Hemoglobin: 13.2 g/dL (ref 13.0–17.0)
MCH: 31.9 pg (ref 26.0–34.0)
MCHC: 35.3 g/dL (ref 30.0–36.0)
MCV: 90.3 fL (ref 80.0–100.0)
Platelets: 66 10*3/uL — ABNORMAL LOW (ref 150–400)
RBC: 4.14 MIL/uL — ABNORMAL LOW (ref 4.22–5.81)
RDW: 12.1 % (ref 11.5–15.5)
WBC: 4.4 10*3/uL (ref 4.0–10.5)
nRBC: 0.5 % — ABNORMAL HIGH (ref 0.0–0.2)

## 2024-03-20 LAB — COMPREHENSIVE METABOLIC PANEL WITH GFR
ALT: 43 U/L (ref 0–44)
AST: 72 U/L — ABNORMAL HIGH (ref 15–41)
Albumin: 3.4 g/dL — ABNORMAL LOW (ref 3.5–5.0)
Alkaline Phosphatase: 117 U/L (ref 38–126)
Anion gap: 8 (ref 5–15)
BUN: 9 mg/dL (ref 8–23)
CO2: 27 mmol/L (ref 22–32)
Calcium: 8.7 mg/dL — ABNORMAL LOW (ref 8.9–10.3)
Chloride: 100 mmol/L (ref 98–111)
Creatinine, Ser: 0.57 mg/dL — ABNORMAL LOW (ref 0.61–1.24)
GFR, Estimated: >60 mL/min (ref >60)
Glucose, Bld: 94 mg/dL (ref 70–99)
Potassium: 3.3 mmol/L — ABNORMAL LOW (ref 3.5–5.1)
Sodium: 135 mmol/L (ref 135–145)
Total Bilirubin: 1.9 mg/dL — ABNORMAL HIGH (ref 0.0–1.2)
Total Protein: 6.9 g/dL (ref 6.5–8.1)

## 2024-03-20 NOTE — Plan of Care (Signed)
 Patient alert & oriented x 4. Patient appropriate and cooperative spent most of the day upright in chair. Completed several bed to chair back to bed transfers with supervision successfully. Good appetite. Had therapeutic exchange r/t ETOH. All safety precautions maintained. Took all scheduled medications.  Problem: Education: Goal: Knowledge of General Education information will improve Description: Including pain rating scale, medication(s)/side effects and non-pharmacologic comfort measures Outcome: Progressing   Problem: Clinical Measurements: Goal: Diagnostic test results will improve Outcome: Progressing   Problem: Activity: Goal: Risk for activity intolerance will decrease Outcome: Progressing   Problem: Nutrition: Goal: Adequate nutrition will be maintained Outcome: Progressing   Problem: Skin Integrity: Goal: Risk for impaired skin integrity will decrease Outcome: Progressing

## 2024-03-20 NOTE — TOC Initial Note (Deleted)
 Transition of Care (TOC) - Initial/Assessment Note    Patient Details  Name: Douglas Peterson. MRN: [id number] Date of Birth: Apr 12, 1951  Transition of Care [facility]) CM/SW Contact:    [place] L Aniyha Tate, LCSW Phone Number: 03/20/2024, 10:40 AM  Clinical Narrative:                    CSW spoke with patient at bedside. SNF placement was discussed. Pt advised that he prefers [facility]. A list of SNF providers was given to patient to consider.   Pt advised that he has a daughter, Jenette Mitchell, who resides in [place] but he doesn't want to bother her. He stated that a neighbor can come pick him up.   Pt acknowledges that he drinks. He reports that sometimes he has one or two beers a day but it could be more on the weekend. Pt lives alone. Pt declined resources to address substance abuse.      Patient Goals and CMS Choice            Expected Discharge Plan and Services                                              Prior Living Arrangements/Services   Lives with:: Self Patient language and need for interpreter reviewed:: Yes (English) Do you feel safe going back to the place where you live?: Yes      Need for Family Participation in Patient Care: Yes (Comment) Care giver support system in place?: No (comment)      Activities of Daily Living   ADL Screening (condition at time of admission) Independently performs ADLs?: No Does the patient have a NEW difficulty with bathing/dressing/toileting/self-feeding that is expected to last >3 days?: Yes (Initiates electronic notice to provider for possible OT consult) Does the patient have a NEW difficulty with getting in/out of bed, walking, or climbing stairs that is expected to last >3 days?: Yes (Initiates electronic notice to provider for possible PT consult) Does the patient have a NEW difficulty with communication that is expected to last >3 days?: No Is the patient deaf or have difficulty hearing?: No Does the patient have  difficulty seeing, even when wearing glasses/contacts?: No Does the patient have difficulty concentrating, remembering, or making decisions?: Yes  Permission Sought/Granted Permission sought to share information with :  (Daughter) Permission granted to share information with : Yes, Verbal Permission Granted  Share Information with NAME: Laddie Pickerel           Emotional Assessment Appearance:: Appears older than stated age Attitude/Demeanor/Rapport: Engaged Affect (typically observed): Calm Orientation: : Oriented to Self, Oriented to Place, Oriented to Situation Alcohol / Substance Use: Alcohol Use (One or two beers. Sometimes on the weekend he drinks) Psych Involvement: No (comment)  Admission diagnosis:  Alcohol withdrawal (HCC) [F10.939] Injury of head, initial encounter [S09.90XA] Fall, initial encounter [W19.XXXA] Alcohol withdrawal syndrome with complication [facility]) [F10.939] Patient Active Problem List   Diagnosis Date Noted   Alcohol withdrawal (HCC) 03/19/2024   Incarcerated hernia 01/27/2024   S/P laparoscopic hernia repair 01/27/2024   Intractable nausea and vomiting 10/06/2023   Protein-calorie malnutrition, severe 08/20/2023   Ground-level fall 08/16/2023   GERD without esophagitis 08/06/2023   Nausea 07/19/2023   Pressure injury of skin 07/11/2023   Alcohol abuse 07/11/2023   Abdominal pain 06/16/2023   Elevated lactic acid  level 06/16/2023   Chronic diastolic CHF (congestive heart failure) (HCC) 06/16/2023   Increased urinary frequency 06/08/2023   Diarrhea 06/08/2023   Upper respiratory infection 06/04/2023   Enteritis 06/02/2023   Polyuria 06/02/2023   Dyslipidemia 05/01/2023   Alcohol dependence (HCC) 05/01/2023   Maggot infestation feet 05/11/2022   Unable to care for self 05/11/2022   Debility 04/25/2022   Weakness 04/23/2022   Epistaxis 04/23/2022   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   Generalized weakness 03/18/2022   Nondisplaced  fracture of shaft of left clavicle, initial encounter for closed fracture 03/18/2022   BPH (benign prostatic hyperplasia) 03/18/2022   Clavicle fracture 03/17/2022   Fall 03/17/2022   CAP (community acquired pneumonia) 03/13/2022   Altered mental status, unspecified 02/20/2022   Hypotension 02/19/2022   Dysphonia 08/13/2021   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   Paresthesia of right upper extremity 08/13/2021   SIRS (systemic inflammatory response syndrome) (HCC) 08/05/2020   GERD (gastroesophageal reflux disease) 06/05/2020   Alcohol use disorder, severe, dependence (HCC) 05/28/2020   Elevated LFTs 04/06/2020   Paroxysmal A-fib (HCC) 03/17/2020   Primary insomnia 02/06/2020   Left inguinal hernia 01/31/2020   Encounter for competency evaluation    Depression 01/17/2020   Pancytopenia (HCC)    Hypomagnesemia    Alcoholic cirrhosis of liver without ascites (HCC)    ETOH abuse    Alcohol intoxication (HCC)    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) 09/27/2019   Hypokalemia 09/27/2019   Dysphagia 09/27/2019   Alcohol withdrawal delirium, acute, hyperactive (HCC) 09/21/2019   Pressure injury of ankle, stage 1 08/15/2019   Palliative care by specialist    Closed fracture of right proximal humerus 03/19/2019   Vitamin D  deficiency 01/11/2019   Osteoporosis 12/22/2018   Closed nondisplaced fracture of acromial process of right scapula with routine healing 11/15/2018   Compression fracture of L2 vertebra with routine healing 11/15/2018   Delirium tremens (HCC) 03/08/2018   Essential hypertension 09/16/2017   Encephalopathy, 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   Steatohepatitis 12/03/2016   Abnormal liver enzymes 06/17/2016   Hereditary hemochromatosis (HCC) 06/17/2016   Anxiety 07/16/2015   Hyponatremia 07/09/2015   H/O traumatic brain injury 05/22/2014   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HCC) 05/22/2014   PCP:  Pcp, No Pharmacy:   [facility] [place], [place] -  [address] [place] [address] Phone: [phone] Fax: [phone]  [facility] [address] [place] [address] Phone: [phone] Fax: [phone]     Social Drivers of Health (SDOH) Social History: SDOH Screenings   Food Insecurity: No Food Insecurity (03/19/2024)  Housing: Low Risk  (03/19/2024)  Recent Concern: Housing - High Risk (01/27/2024)  Transportation Needs: Unmet Transportation Needs (03/19/2024)  Utilities: Not At Risk (03/19/2024)  Financial Resource Strain: Unknown (06/13/2019)  Physical Activity: Unknown (06/13/2019)  Social Connections: Socially Isolated (03/19/2024)  Stress: Unknown (06/13/2019)  Tobacco Use: Medium Risk (02/11/2024)   SDOH Interventions:     Readmission Risk Interventions    01/07/2024   11:31 AM 06/17/2023    4:23 PM 04/29/2022   10:26 AM  Readmission Risk Prevention Plan  Transportation Screening Complete Complete Complete  HRI or Home Care Consult  Complete Complete  Social Work Consult for Recovery Care Planning/Counseling  Complete Complete  Palliative Care Screening  Not Applicable Not Applicable  Medication Review Oceanographer) Complete Complete Complete  PCP or Specialist appointment within 3-5 days  of discharge Complete    HRI or Home Care Consult Complete    SW Recovery Care/Counseling Consult Complete    Palliative Care Screening Not Applicable    Skilled Nursing Facility Not Applicable

## 2024-03-20 NOTE — TOC Progression Note (Signed)
 Transition of Care (TOC) - Progression Note    Patient Details  Name: Douglas Peterson. MRN: [id number] Date of Birth: [birth year]  Transition of Care [facility]) CM/SW Contact  [place] L Aiana [place], [place] Phone Number: 03/20/2024, 12:11 PM  Clinical Narrative:     CSW attempted to contact patients daughter, Douglas Peterson, to no avail. A voicemail message was left.        Expected Discharge Plan and Services                                               Social Determinants of Health (SDOH) Interventions SDOH Screenings   Food Insecurity: No Food Insecurity (03/19/2024)  Housing: Low Risk  (03/19/2024)  Recent Concern: Housing - High Risk (01/27/2024)  Transportation Needs: Unmet Transportation Needs (03/19/2024)  Utilities: Not At Risk (03/19/2024)  Financial Resource Strain: Unknown (06/13/2019)  Physical Activity: Unknown (06/13/2019)  Social Connections: Socially Isolated (03/19/2024)  Stress: Unknown (06/13/2019)  Tobacco Use: Medium Risk (02/11/2024)    Readmission Risk Interventions    01/07/2024   11:31 AM 06/17/2023    4:23 PM 04/29/2022   10:26 AM  Readmission Risk Prevention Plan  Transportation Screening Complete Complete Complete  HRI or Home Care Consult  Complete Complete  Social Work Consult for Recovery Care Planning/Counseling  Complete Complete  Palliative Care Screening  Not Applicable Not Applicable  Medication Review Oceanographer) Complete Complete Complete  PCP or Specialist appointment within 3-5 days of discharge Complete    HRI or Home Care Consult Complete    SW Recovery Care/Counseling Consult Complete    Palliative Care Screening Not Applicable    Skilled Nursing Facility Not Applicable
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
## 2024-03-20 NOTE — Care Management Obs Status (Signed)
 MEDICARE OBSERVATION STATUS NOTIFICATION   Patient Details  Name: Douglas Peterson. MRN: [id number] Date of Birth: 07/08/1951   Medicare Observation Status Notification Given:  Yes    [place] L Kirill Chatterjee, LCSW 03/20/2024, 10:46 AM
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
## 2024-03-21 ENCOUNTER — Encounter: Payer: Self-pay | Admitting: Internal Medicine

## 2024-03-21 DIAGNOSIS — F10930 Alcohol use, unspecified with withdrawal, uncomplicated: Secondary | ICD-10-CM | POA: Diagnosis not present

## 2024-03-21 LAB — CBC
HCT: 40 % (ref 39.0–52.0)
Hemoglobin: 13.6 g/dL (ref 13.0–17.0)
MCH: 31.2 pg (ref 26.0–34.0)
MCHC: 34 g/dL (ref 30.0–36.0)
MCV: 91.7 fL (ref 80.0–100.0)
Platelets: 65 10*3/uL — ABNORMAL LOW (ref 150–400)
RBC: 4.36 MIL/uL (ref 4.22–5.81)
RDW: 11.9 % (ref 11.5–15.5)
WBC: 4.2 10*3/uL (ref 4.0–10.5)
nRBC: 0 % (ref 0.0–0.2)

## 2024-03-21 LAB — BASIC METABOLIC PANEL WITH GFR
Anion gap: 9 (ref 5–15)
BUN: 13 mg/dL (ref 8–23)
CO2: 27 mmol/L (ref 22–32)
Calcium: 8.9 mg/dL (ref 8.9–10.3)
Chloride: 101 mmol/L (ref 98–111)
Creatinine, Ser: 0.45 mg/dL — ABNORMAL LOW (ref 0.61–1.24)
GFR, Estimated: >60 mL/min (ref >60)
Glucose, Bld: 101 mg/dL — ABNORMAL HIGH (ref 70–99)
Potassium: 3.7 mmol/L (ref 3.5–5.1)
Sodium: 137 mmol/L (ref 135–145)

## 2024-03-21 MED ORDER — LORAZEPAM 1 MG PO TABS: 1.0000 mg | ORAL_TABLET | Freq: Three times a day (TID) | ORAL | Status: DC

## 2024-03-21 MED ORDER — LORAZEPAM 1 MG PO TABS
1.0000 mg | ORAL_TABLET | Freq: Three times a day (TID) | ORAL | Status: DC
  Administered 2024-03-21 – 2024-03-22 (×3): 1 mg via ORAL
  Filled 2024-03-21 (×3): qty 1
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
## 2024-03-21 NOTE — Plan of Care (Signed)
   Problem: Education: Goal: Knowledge of General Education information will improve Description: Including pain rating scale, medication(s)/side effects and non-pharmacologic comfort measures Outcome: Progressing   Problem: Clinical Measurements: Goal: Ability to maintain clinical measurements within normal limits will improve Outcome: Progressing Goal: Will remain free from infection Outcome: Progressing Goal: Diagnostic test results will improve Outcome: Progressing Goal: Respiratory complications will improve Outcome: Progressing Goal: Cardiovascular complication will be avoided Outcome: Progressing   Problem: Activity: Goal: Risk for activity intolerance will decrease Outcome: Progressing   Problem: Nutrition: Goal: Adequate nutrition will be maintained Outcome: Progressing   Problem: Coping: Goal: Level of anxiety will decrease Outcome: Progressing   Problem: Elimination: Goal: Will not experience complications related to bowel motility Outcome: Progressing Goal: Will not experience complications related to urinary retention Outcome: Progressing   Problem: Pain Managment: Goal: General experience of comfort will improve and/or be controlled Outcome: Progressing   Problem: Safety: Goal: Ability to remain free from injury will improve Outcome: Progressing   Problem: Skin Integrity: Goal: Risk for impaired skin integrity will decrease Outcome: Progressing

## 2024-03-21 NOTE — Progress Notes (Signed)
 Mobility Specialist - Progress Note   03/21/24 1500  Mobility  Activity Ambulated with assistance in hallway  Level of Assistance Contact guard assist, steadying assist  Assistive Device Front wheel walker  Distance Ambulated (ft) 160 ft  Activity Response Tolerated well  Mobility visit 1 Mobility     Pt lying in bed upon arrival, utilizing RA. Pt agreeable to activity. Completed bed mobility modI. STS and ambulation in hallway with CGA. Some unsteadiness with LOB x1 corrected with CGA. Pt returned to EOB with alarm set, needs in reach. No complaints.    Searcy Czech Mobility Specialist 03/21/24, 3:24 PM
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
## 2024-03-22 DIAGNOSIS — F10930 Alcohol use, unspecified with withdrawal, uncomplicated: Secondary | ICD-10-CM | POA: Diagnosis not present

## 2024-03-22 MED ORDER — LORAZEPAM 0.5 MG PO TABS
0.5000 mg | ORAL_TABLET | Freq: Three times a day (TID) | ORAL | Status: DC
  Administered 2024-03-23 – 2024-03-25 (×8): 0.5 mg via ORAL
  Filled 2024-03-22 (×8): qty 1

## 2024-03-22 MED ORDER — LORAZEPAM 1 MG PO TABS
1.0000 mg | ORAL_TABLET | Freq: Three times a day (TID) | ORAL | Status: AC
  Administered 2024-03-22 – 2024-03-23 (×3): 1 mg via ORAL
  Filled 2024-03-22 (×3): qty 1

## 2024-03-22 MED ORDER — LORAZEPAM 2 MG PO TABS: 2.0000 mg | ORAL_TABLET | Freq: Three times a day (TID) | ORAL | Status: DC

## 2024-03-22 MED ORDER — LORAZEPAM 1 MG PO TABS: 1.0000 mg | ORAL_TABLET | Freq: Three times a day (TID) | ORAL | Status: DC
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
## 2024-03-22 NOTE — Plan of Care (Signed)
 PT goals late entry. Emaline Handsome, PT, DPT 03/22/24, 3:24 PM  Problem: Acute Rehab PT Goals(only PT should resolve) Goal: Pt Will Go Supine/Side To Sit Flowsheets (Taken 03/22/2024 1523) Pt will go Supine/Side to Sit: Independently Goal: Pt Will Go Sit To Supine/Side Flowsheets (Taken 03/22/2024 1523) Pt will go Sit to Supine/Side: Independently Goal: Pt Will Transfer Bed To Chair/Chair To Bed Flowsheets (Taken 03/22/2024 1523) Pt will Transfer Bed to Chair/Chair to Bed: with modified independence Note: With LRAD Goal: Pt Will Ambulate Flowsheets (Taken 03/22/2024 1523) Pt will Ambulate:  > 125 feet  with modified independence  with least restrictive assistive device Goal: Pt Will Go Up/Down Stairs Flowsheets (Taken 03/22/2024 1523) Pt will Go Up / Down Stairs:  3-5 stairs  with modified independence  with rail(s)  with least restrictive assistive device

## 2024-03-22 NOTE — [place] FL2 (Signed)
 [place]  MEDICAID FL2 LEVEL OF CARE FORM     IDENTIFICATION  Patient Name: Douglas Peterson. Birthdate: 1951/07/13 Sex: male Admission Date (Current Location): 03/19/2024  [place] and [place] Number:  Chiropodist and Address:  [facility], [address], [place], [place] [address]      Provider Number: [id number]  Attending Physician Name and Address:  Melodi Sprung, DO  Relative Name and Phone Number:  Laddie Pickerel, daughter, phone: [phone] and Iram Lundberg, sister, phone: [phone]    Current Level of Care: Hospital Recommended Level of Care: Skilled Nursing Facility Prior Approval Number:    Date Approved/Denied: 07/10/15 PASRR Number: [id number]  Discharge Plan: SNF    Current Diagnoses: Patient Active Problem List   Diagnosis Date Noted   Alcohol withdrawal (HCC) 03/19/2024   Incarcerated hernia 01/27/2024   S/P laparoscopic hernia repair 01/27/2024   Intractable nausea and vomiting 10/06/2023   Protein-calorie malnutrition, severe 08/20/2023   Ground-level fall 08/16/2023   GERD without esophagitis 08/06/2023   Nausea 07/19/2023   Pressure injury of skin 07/11/2023   Alcohol abuse 07/11/2023   Abdominal pain 06/16/2023   Elevated lactic acid level 06/16/2023   Chronic diastolic CHF (congestive heart failure) (HCC) 06/16/2023   Increased urinary frequency 06/08/2023   Diarrhea 06/08/2023   Upper respiratory infection 06/04/2023   Enteritis 06/02/2023   Polyuria 06/02/2023   Dyslipidemia 05/01/2023   Alcohol dependence (HCC) 05/01/2023   Maggot infestation feet 05/11/2022   Unable to care for self 05/11/2022   Debility 04/25/2022   Weakness 04/23/2022   Epistaxis 04/23/2022   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   Generalized weakness 03/18/2022   Nondisplaced fracture of shaft of left clavicle, initial encounter for closed fracture 03/18/2022   BPH (benign prostatic hyperplasia) 03/18/2022    Clavicle fracture 03/17/2022   Fall 03/17/2022   CAP (community acquired pneumonia) 03/13/2022   Altered mental status, unspecified 02/20/2022   Hypotension 02/19/2022   Dysphonia 08/13/2021   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   Paresthesia of right upper extremity 08/13/2021   SIRS (systemic inflammatory response syndrome) (HCC) 08/05/2020   GERD (gastroesophageal reflux disease) 06/05/2020   Alcohol use disorder, severe, dependence (HCC) 05/28/2020   Elevated LFTs 04/06/2020   Paroxysmal A-fib (HCC) 03/17/2020   Primary insomnia 02/06/2020   Left inguinal hernia 01/31/2020   Encounter for competency evaluation    Depression 01/17/2020   Pancytopenia (HCC)    Hypomagnesemia    Alcoholic cirrhosis of liver without ascites (HCC)    ETOH abuse    Alcohol intoxication (HCC)    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) 09/27/2019   Hypokalemia 09/27/2019   Dysphagia 09/27/2019   Alcohol withdrawal delirium, acute, hyperactive (HCC) 09/21/2019   Pressure injury of ankle, stage 1 08/15/2019   Palliative care by specialist    Closed fracture of right proximal humerus 03/19/2019   Vitamin D  deficiency 01/11/2019   Osteoporosis 12/22/2018   Closed nondisplaced fracture of acromial process of right scapula with routine healing 11/15/2018   Compression fracture of L2 vertebra with routine healing 11/15/2018   Delirium tremens (HCC) 03/08/2018   Essential hypertension 09/16/2017   Encephalopathy, 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   Steatohepatitis 12/03/2016   Abnormal liver enzymes 06/17/2016   Hereditary hemochromatosis (HCC) 06/17/2016   Anxiety 07/16/2015   Hyponatremia 07/09/2015   H/O traumatic brain injury 05/22/2014   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HCC) 05/22/2014    Orientation RESPIRATION BLADDER Height & Weight  Self, Time, Situation, Place  Normal Continent Weight: 57.8 kg Height:  5\' 3"  (160 cm)  BEHAVIORAL SYMPTOMS/MOOD NEUROLOGICAL BOWEL  NUTRITION STATUS      Continent Diet (Please see discharge summary)  AMBULATORY STATUS COMMUNICATION OF NEEDS Skin   Extensive Assist Verbally Bruising (Right leg, left knee, buttocks, non-tenting)                       Personal Care Assistance Level of Assistance  Bathing, Feeding, Dressing, Total care Bathing Assistance: Limited assistance Feeding assistance: Limited assistance Dressing Assistance: Limited assistance Total Care Assistance: Limited assistance   Functional Limitations Info             SPECIAL CARE FACTORS FREQUENCY                       Contractures      Additional Factors Info  Code Status, Allergies Code Status Info: Full Allergies Info: Hydrochlorothiazide  No severity specified - Other (See Comments) Comments  Other  Low - No reactions specified           Current Medications (03/22/2024):  This is the current hospital active medication list Current Facility-Administered Medications  Medication Dose Route Frequency Provider Last Rate Last Admin   acetaminophen  (TYLENOL ) tablet 650 mg  650 mg Oral Q8H PRN [place], MD       amLODipine  (NORVASC ) tablet 5 mg  5 mg Oral Daily Antoniette Batty T, MD   5 mg at 03/22/24 7829   atenolol  (TENORMIN ) tablet 25 mg  25 mg Oral Daily Antoniette Batty T, MD   25 mg at 03/22/24 5621   atorvastatin  (LIPITOR) tablet 10 mg  10 mg Oral Daily Antoniette Batty T, MD   10 mg at 03/22/24 3086   baclofen  (LIORESAL ) tablet 5 mg  5 mg Oral TID [place], MD   5 mg at 03/22/24 5784   feeding supplement (ENSURE ENLIVE / ENSURE PLUS) liquid 237 mL  237 mL Oral BID BM Antoniette Batty T, MD   237 mL at 03/22/24 1339   LORazepam  (ATIVAN ) tablet 1 mg  1 mg Oral TID Alexander, Natalie, DO       Followed by   Cecily Cohen ON 03/23/2024] LORazepam  (ATIVAN ) tablet 0.5 mg  0.5 mg Oral TID Alexander, Natalie, DO       melatonin tablet 5 mg  5 mg Oral QHS Zhang, Ping T, MD   5 mg at 03/21/24 2045   ondansetron  (ZOFRAN ) tablet 4 mg  4 mg Oral Q6H  PRN Antoniette Batty T, MD       Or   ondansetron  (ZOFRAN ) injection 4 mg  4 mg Intravenous Q6H PRN Antoniette Batty T, MD   4 mg at 03/19/24 2106   oxyCODONE -acetaminophen  (PERCOCET/ROXICET) 5-325 MG per tablet 1 tablet  1 tablet Oral Q6H PRN Antoniette Batty T, MD   1 tablet at 03/22/24 0240   pantoprazole  (PROTONIX ) EC tablet 40 mg  40 mg Oral Daily Antoniette Batty T, MD   40 mg at 03/22/24 0924   polyethylene glycol (MIRALAX  / GLYCOLAX ) packet 17 g  17 g Oral Daily PRN Antoniette Batty T, MD       sucralfate  (CARAFATE ) tablet 1 g  1 g Oral TID WC & HS Antoniette Batty T, MD   1 g at 03/22/24 1151   thiamine  (VITAMIN B1) tablet 100 mg  100 mg Oral Daily Kinner, Robert, MD   100 mg at  03/22/24 4098   Or   thiamine  (VITAMIN B1) injection 100 mg  100 mg Intravenous Daily Bryson Carbine, MD   100 mg at 03/19/24 1642     Discharge Medications: Please see discharge summary for a list of discharge medications.  Relevant Imaging Results:  Relevant Lab Results:   Additional Information [id number]  Crayton Docker, RN
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
## 2024-03-22 NOTE — TOC Progression Note (Signed)
 Transition of Care (TOC) - Progression Note    Patient Details  Name: Douglas Peterson. MRN: [id number] Date of Birth: 04-22-1951  Transition of Care [facility]) CM/SW Contact  Crayton Docker, RN 03/22/2024, 5:16 PM  Clinical Narrative:     CM to patient's room regarding SNF recommendations. Per patient, has previous SNF experience and prefers to return to [facility]. CM call to Gena, Admissions, [facility]. Per Gena, will review upon referral receipt.  CM completed FL2, alert to Dr. Authur Leghorn regarding signature. CM review in [place] MUST, PASSR is [id number], approved on 07/10/2015. CM faxed SNF referrals to [facility], [facility] [place], [facility] [place].   Expected Discharge Plan and Services    SNF   Social Determinants of Health (SDOH) Interventions SDOH Screenings   Food Insecurity: No Food Insecurity (03/19/2024)  Housing: Low Risk  (03/19/2024)  Recent Concern: Housing - High Risk (01/27/2024)  Transportation Needs: Unmet Transportation Needs (03/19/2024)  Utilities: Not At Risk (03/19/2024)  Financial Resource Strain: Unknown (06/13/2019)  Physical Activity: Unknown (06/13/2019)  Social Connections: Socially Isolated (03/19/2024)  Stress: Unknown (06/13/2019)  Tobacco Use: Medium Risk (03/21/2024)    Readmission Risk Interventions    01/07/2024   11:31 AM 06/17/2023    4:23 PM 04/29/2022   10:26 AM  Readmission Risk Prevention Plan  Transportation Screening Complete Complete Complete  HRI or Home Care Consult  Complete Complete  Social Work Consult for Recovery Care Planning/Counseling  Complete Complete  Palliative Care Screening  Not Applicable Not Applicable  Medication Review Oceanographer) Complete Complete Complete  PCP or Specialist appointment within 3-5 days of discharge Complete    HRI or Home Care Consult Complete    SW Recovery Care/Counseling Consult Complete    Palliative Care Screening Not Applicable     Skilled Nursing Facility Not Applicable

## 2024-03-22 NOTE — Plan of Care (Signed)
  Problem: Clinical Measurements: Goal: Ability to maintain clinical measurements within normal limits will improve Outcome: Progressing Goal: Will remain free from infection Outcome: Progressing Goal: Diagnostic test results will improve Outcome: Progressing Goal: Respiratory complications will improve Outcome: Progressing Goal: Cardiovascular complication will be avoided Outcome: Progressing   Problem: Activity: Goal: Risk for activity intolerance will decrease Outcome: Progressing   Problem: Nutrition: Goal: Adequate nutrition will be maintained Outcome: Progressing   Problem: Coping: Goal: Level of anxiety will decrease Outcome: Progressing   Problem: Elimination: Goal: Will not experience complications related to bowel motility Outcome: Progressing Goal: Will not experience complications related to urinary retention Outcome: Progressing   Problem: Pain Managment: Goal: General experience of comfort will improve and/or be controlled Outcome: Progressing   Problem: Safety: Goal: Ability to remain free from injury will improve Outcome: Progressing   Problem: Skin Integrity: Goal: Risk for impaired skin integrity will decrease Outcome: Progressing

## 2024-03-23 DIAGNOSIS — F10930 Alcohol use, unspecified with withdrawal, uncomplicated: Secondary | ICD-10-CM | POA: Diagnosis not present

## 2024-03-23 MED ORDER — POLYETHYLENE GLYCOL 3350 17 G PO PACK
17.0000 g | PACK | Freq: Two times a day (BID) | ORAL | Status: DC
  Administered 2024-03-23 – 2024-03-25 (×6): 17 g via ORAL
  Filled 2024-03-23 (×6): qty 1

## 2024-03-23 MED ORDER — MENTHOL 3 MG MT LOZG
1.0000 | LOZENGE | OROMUCOSAL | Status: DC | PRN
  Administered 2024-03-23 – 2024-03-24 (×4): 3 mg via ORAL
  Filled 2024-03-23: qty 9

## 2024-03-23 MED ORDER — SMOG ENEMA: 960.0000 mL | Freq: Once | RECTAL | Status: DC | PRN
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
## 2024-03-23 NOTE — Progress Notes (Signed)
 Mobility Specialist - Progress Note   03/23/24 1600  Mobility  Activity Ambulated with assistance in hallway  Level of Assistance Contact guard assist, steadying assist  Assistive Device Front wheel walker  Distance Ambulated (ft) 200 ft  Activity Response Tolerated well  Mobility visit 1 Mobility     Pt ambulated in hallway with CGA. ModI to complete bed mobility. LOB x2 corrected by CGA. Pt returned to bed with alarm set, needs in reach.     Searcy Czech Mobility Specialist 03/23/24, 4:46 PM

## 2024-03-23 NOTE — Progress Notes (Signed)
 Chart review shows no bowel movement since 5/15 as below.  Messaged primary RN about PRN miralax  Flowsheet Documentation Last filed value  Last BM Date  03/17/24   Stool Occurrence     Measured Stool

## 2024-03-23 NOTE — TOC Progression Note (Signed)
 Transition of Care (TOC) - Progression Note    Patient Details  Name: Douglas Peterson. MRN: [id number] Date of Birth: 03/23/1951  Transition of Care [facility]) CM/SW Contact  Elsie Halo, RN Phone Number: 03/23/2024, 3:47 PM  Clinical Narrative:   Jannette Mend at [facility] is out of office. TOC outreached to Rae Bugler [phone] at [facility] to check status of bed offer and did not receive an answer. TOC left a message requesting a callback. TOC will continue bed search.       Expected Discharge Plan and Services                                               Social Determinants of Health (SDOH) Interventions SDOH Screenings   Food Insecurity: No Food Insecurity (03/19/2024)  Housing: Low Risk  (03/23/2024)  Recent Concern: Housing - High Risk (01/27/2024)  Transportation Needs: Unmet Transportation Needs (03/19/2024)  Utilities: Not At Risk (03/19/2024)  Financial Resource Strain: Unknown (06/13/2019)  Physical Activity: Unknown (06/13/2019)  Social Connections: Socially Isolated (03/19/2024)  Stress: Unknown (06/13/2019)  Tobacco Use: Medium Risk (03/21/2024)    Readmission Risk Interventions    01/07/2024   11:31 AM 06/17/2023    4:23 PM 04/29/2022   10:26 AM  Readmission Risk Prevention Plan  Transportation Screening Complete Complete Complete  HRI or Home Care Consult  Complete Complete  Social Work Consult for Recovery Care Planning/Counseling  Complete Complete  Palliative Care Screening  Not Applicable Not Applicable  Medication Review Oceanographer) Complete Complete Complete  PCP or Specialist appointment within 3-5 days of discharge Complete    HRI or Home Care Consult Complete    SW Recovery Care/Counseling Consult Complete    Palliative Care Screening Not Applicable    Skilled Nursing Facility Not Applicable

## 2024-03-24 DIAGNOSIS — F10930 Alcohol use, unspecified with withdrawal, uncomplicated: Secondary | ICD-10-CM | POA: Diagnosis not present

## 2024-03-24 NOTE — TOC Progression Note (Addendum)
 Transition of Care (TOC) - Progression Note    Patient Details  Name: Douglas Peterson. MRN: [id number] Date of Birth: 06-29-1951  Transition of Care [facility]) CM/SW Contact  Elsie Halo, RN Phone Number: 03/24/2024, 11:30 AM  Clinical Narrative:     TOC spoke with Gena [phone] at [facility] and they can offer a bed provided the patient is willing to pay $1,295 before admitting.   TOC spoke with the patient and informed that the $1295 is a balance from his last admission. He is in his co-pay days so he would be charged $214 a day for this admission.  The patient advised he can't pay the $214 a day. He would like to go home with [facility] support. Patient offered choice for [facility] but advised TOC to find an INN agency accepting patients.  TOC outreached to the patient's daughter Douglas Peterson [phone] and she advised she doesn't wish to be involved in his healthcare decisions.     Expected Discharge Plan and Services                                               Social Determinants of Health (SDOH) Interventions SDOH Screenings   Food Insecurity: No Food Insecurity (03/19/2024)  Housing: Low Risk  (03/23/2024)  Recent Concern: Housing - High Risk (01/27/2024)  Transportation Needs: Unmet Transportation Needs (03/19/2024)  Utilities: Not At Risk (03/19/2024)  Financial Resource Strain: Unknown (06/13/2019)  Physical Activity: Unknown (06/13/2019)  Social Connections: Socially Isolated (03/19/2024)  Stress: Unknown (06/13/2019)  Tobacco Use: Medium Risk (03/21/2024)    Readmission Risk Interventions    01/07/2024   11:31 AM 06/17/2023    4:23 PM 04/29/2022   10:26 AM  Readmission Risk Prevention Plan  Transportation Screening Complete Complete Complete  HRI or Home Care Consult  Complete Complete  Social Work Consult for Recovery Care Planning/Counseling  Complete Complete  Palliative Care Screening  Not Applicable Not Applicable  Medication Review Oceanographer)  Complete Complete Complete  PCP or Specialist appointment within 3-5 days of discharge Complete    HRI or Home Care Consult Complete    SW Recovery Care/Counseling Consult Complete    Palliative Care Screening Not Applicable    Skilled Nursing Facility Not Applicable
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
## 2024-03-25 DIAGNOSIS — S0990XA Unspecified injury of head, initial encounter: Secondary | ICD-10-CM

## 2024-03-25 DIAGNOSIS — W19XXXA Unspecified fall, initial encounter: Secondary | ICD-10-CM

## 2024-03-25 DIAGNOSIS — F10930 Alcohol use, unspecified with withdrawal, uncomplicated: Secondary | ICD-10-CM | POA: Diagnosis not present

## 2024-03-25 MED ORDER — ATENOLOL 25 MG PO TABS: 25.0000 mg | ORAL_TABLET | Freq: Every day | ORAL | Status: DC
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
## 2024-03-26 ENCOUNTER — Emergency Department
Admission: EM | Admit: 2024-03-26 | Discharge: 2024-03-27 | Disposition: A | Discharge status: Home / Self Care | Attending: Emergency Medicine | Admitting: Emergency Medicine

## 2024-03-26 ENCOUNTER — Emergency Department

## 2024-03-26 ENCOUNTER — Other Ambulatory Visit: Payer: Self-pay

## 2024-03-26 DIAGNOSIS — Z87820 Personal history of traumatic brain injury: Secondary | ICD-10-CM | POA: Insufficient documentation

## 2024-03-26 DIAGNOSIS — I10 Essential (primary) hypertension: Secondary | ICD-10-CM | POA: Diagnosis not present

## 2024-03-26 DIAGNOSIS — F10929 Alcohol use, unspecified with intoxication, unspecified: Secondary | ICD-10-CM

## 2024-03-26 DIAGNOSIS — F10129 Alcohol abuse with intoxication, unspecified: Secondary | ICD-10-CM | POA: Insufficient documentation

## 2024-03-26 DIAGNOSIS — R0602 Shortness of breath: Secondary | ICD-10-CM | POA: Diagnosis present

## 2024-03-26 DIAGNOSIS — I480 Paroxysmal atrial fibrillation: Secondary | ICD-10-CM | POA: Diagnosis not present

## 2024-03-26 DIAGNOSIS — Y907 Blood alcohol level of 200-239 mg/100 ml: Secondary | ICD-10-CM | POA: Insufficient documentation

## 2024-03-26 LAB — COMPREHENSIVE METABOLIC PANEL WITH GFR
ALT: 41 U/L (ref 0–44)
AST: 34 U/L (ref 15–41)
Albumin: 4 g/dL (ref 3.5–5.0)
Alkaline Phosphatase: 90 U/L (ref 38–126)
Anion gap: 11 (ref 5–15)
BUN: 7 mg/dL — ABNORMAL LOW (ref 8–23)
CO2: 24 mmol/L (ref 22–32)
Calcium: 8.7 mg/dL — ABNORMAL LOW (ref 8.9–10.3)
Chloride: 98 mmol/L (ref 98–111)
Creatinine, Ser: 0.49 mg/dL — ABNORMAL LOW (ref 0.61–1.24)
GFR, Estimated: >60 mL/min (ref >60)
Glucose, Bld: 99 mg/dL (ref 70–99)
Potassium: 3.4 mmol/L — ABNORMAL LOW (ref 3.5–5.1)
Sodium: 133 mmol/L — ABNORMAL LOW (ref 135–145)
Total Bilirubin: 0.8 mg/dL (ref 0.0–1.2)
Total Protein: 7.6 g/dL (ref 6.5–8.1)

## 2024-03-26 LAB — CBC WITH DIFFERENTIAL/PLATELET
Abs Immature Granulocytes: 0.01 10*3/uL (ref 0.00–0.07)
Basophils Absolute: 0.1 10*3/uL (ref 0.0–0.1)
Basophils Relative: 1 %
Eosinophils Absolute: 0.1 10*3/uL (ref 0.0–0.5)
Eosinophils Relative: 2 %
HCT: 40.2 % (ref 39.0–52.0)
Hemoglobin: 13.7 g/dL (ref 13.0–17.0)
Immature Granulocytes: 0 %
Lymphocytes Relative: 64 %
Lymphs Abs: 4.4 10*3/uL — ABNORMAL HIGH (ref 0.7–4.0)
MCH: 32.1 pg (ref 26.0–34.0)
MCHC: 34.1 g/dL (ref 30.0–36.0)
MCV: 94.1 fL (ref 80.0–100.0)
Monocytes Absolute: 0.5 10*3/uL (ref 0.1–1.0)
Monocytes Relative: 7 %
Neutro Abs: 1.8 10*3/uL (ref 1.7–7.7)
Neutrophils Relative %: 26 %
Platelets: 187 10*3/uL (ref 150–400)
RBC: 4.27 MIL/uL (ref 4.22–5.81)
RDW: 13.3 % (ref 11.5–15.5)
Smear Review: NORMAL
WBC: 6.8 10*3/uL (ref 4.0–10.5)
nRBC: 0 % (ref 0.0–0.2)

## 2024-03-26 LAB — ETHANOL: Alcohol, Ethyl (B): 227 mg/dL — ABNORMAL HIGH (ref <15)

## 2024-03-26 LAB — BRAIN NATRIURETIC PEPTIDE: B Natriuretic Peptide: 11.8 pg/mL (ref 0.0–100.0)

## 2024-03-26 LAB — TROPONIN I (HIGH SENSITIVITY)
Troponin I (High Sensitivity): 4 ng/L (ref <18)
Troponin I (High Sensitivity): 4 ng/L (ref <18)

## 2024-03-26 MED ORDER — THIAMINE HCL 100 MG/ML IJ SOLN: 100.0000 mg | Freq: Every day | INTRAMUSCULAR | Status: DC

## 2024-03-26 MED ORDER — LORAZEPAM 2 MG/ML IJ SOLN
0.0000 mg | Freq: Four times a day (QID) | INTRAMUSCULAR | Status: DC
Start: 2024-03-27 — End: 2024-03-28
  Administered 2024-03-27: 1 mg via INTRAVENOUS
  Filled 2024-03-26: qty 1

## 2024-03-26 MED ORDER — THIAMINE MONONITRATE 100 MG PO TABS: 100.0000 mg | ORAL_TABLET | Freq: Every day | ORAL | Status: DC

## 2024-03-26 MED ORDER — LORAZEPAM 2 MG PO TABS: 0.0000 mg | ORAL_TABLET | Freq: Four times a day (QID) | ORAL | Status: DC

## 2024-03-26 MED ORDER — LORAZEPAM 2 MG PO TABS: 0.0000 mg | ORAL_TABLET | Freq: Two times a day (BID) | ORAL | Status: DC

## 2024-03-26 MED ORDER — LORAZEPAM 2 MG/ML IJ SOLN
0.0000 mg | Freq: Two times a day (BID) | INTRAMUSCULAR | Status: DC
Start: 2024-03-29 — End: 2024-03-31

## 2024-03-26 NOTE — ED Provider Notes (Signed)
 [facility] Provider Note   Event Date/Time   First MD Initiated Contact with Patient 03/26/24 1849     (approximate) History  Shortness of Breath  HPI Douglas Peterson. is a 73 y.o. male with a past medical history of alcohol abuse, TBI, hypertension, and paroxysmal atrial fibrillation who presents complaining of shortness of breath via EMS.  Patient also admits to heavy alcohol use today and refuses to answer any other questions. ROS: Patient currently denies any vision changes, tinnitus, difficulty speaking, facial droop, sore throat, chest pain, shortness of breath, abdominal pain, nausea/vomiting/diarrhea, dysuria, or weakness/numbness/paresthesias in any extremity   Physical Exam  Triage Vital Signs: ED Triage Vitals  Encounter Vitals Group     BP      Systolic BP Percentile      Diastolic BP Percentile      Pulse      Resp      Temp      Temp src      SpO2      Weight      Height      Head Circumference      Peak Flow      Pain Score      Pain Loc      Pain Education      Exclude from Growth Chart    Most recent vital signs: Vitals:   03/27/24 0400 03/27/24 0610  BP: 139/75 (!) 153/93  Pulse: 87 89  Resp: (!) 23 18  Temp:  97.8 F (36.6 C)  SpO2: 97% 98%   General: Asleep but easily awoken to voice.  Cooperative CV:  Good peripheral perfusion.  Resp:  Normal effort.  Abd:  No distention.  Other:  Elderly well-developed, well-nourished Caucasian male resting comfortably in no acute distress.  Heavy odor of alcohol ED Results / Procedures / Treatments  Labs (all labs ordered are listed, but only abnormal results are displayed) Labs Reviewed  COMPREHENSIVE METABOLIC PANEL WITH GFR - Abnormal; Notable for the following components:      Result Value   Sodium 133 (*)    Potassium 3.4 (*)    BUN 7 (*)    Creatinine, Ser 0.49 (*)    Calcium  8.7 (*)    All other components within normal limits  CBC WITH DIFFERENTIAL/PLATELET -  Abnormal; Notable for the following components:   Lymphs Abs 4.4 (*)    All other components within normal limits  ETHANOL - Abnormal; Notable for the following components:   Alcohol, Ethyl (B) 227 (*)    All other components within normal limits  BRAIN NATRIURETIC PEPTIDE  TROPONIN I (HIGH SENSITIVITY)  TROPONIN I (HIGH SENSITIVITY)   EKG ED ECG REPORT I, Charleen Conn, the attending physician, personally viewed and interpreted this ECG. Date: 03/26/2024 EKG Time: 1902 Rate: 70 Rhythm: normal sinus rhythm QRS Axis: normal Intervals: normal ST/T Wave abnormalities: normal Narrative Interpretation: no evidence of acute ischemia RADIOLOGY ED MD interpretation: One-view portable chest x-ray interpreted by me shows no evidence of acute abnormalities including no pneumonia, pneumothorax, or widened mediastinum.  It is noted the patient has low lung volumes and likely had poor effort during x-ray -Agree with radiology assessment Official radiology report(s): No results found. PROCEDURES: Critical Care performed: No .1-3 Lead EKG Interpretation  Performed by: Charleen Conn, MD Authorized by: Charleen Conn, MD     Interpretation: normal     ECG rate:  81   ECG rate assessment: normal  Rhythm: sinus rhythm     Ectopy: none     Conduction: normal    MEDICATIONS ORDERED IN ED: Medications - No data to display IMPRESSION / MDM / ASSESSMENT AND PLAN / ED COURSE  I reviewed the triage vital signs and the nursing notes.                             The patient is on the cardiac monitor to evaluate for evidence of arrhythmia and/or significant heart rate changes. Patient's presentation is most consistent with acute presentation with potential threat to life or bodily function. The patient is suffering from shortness of breath, but the immediate cause is not apparent.  Potential causes considered include, but are not limited to, asthma or COPD, congestive heart failure,  pulmonary embolism, pneumothorax, coronary syndrome, pneumonia, and pleural effusion. Patient does have signs of acute alcohol intoxication and will require metabolization prior to discharge Care of this patient will be signed out to the oncoming physician at the end of my shift.  All pertinent patient information conveyed and all questions answered.  All further care and disposition decisions will be made by the oncoming physician. Clinical Course as of 03/28/24 0133  Sun Mar 27, 2024  0344 [name] awake and alert.  He got 1 mg of p.o. Ativan  per CIWA protocol.  He is asking for more medication but he is awake and alert and oriented.  There is no indication he needs to stay in the emergency department.  I recommended that he call someone to come pick him up and take him home and he said that he agreed and will try to do so. [CF]  [phone] Patient will be transported home via [facility].  The patient's medical screening exam is reassuring with no indication of an emergent medical condition requiring hospitalization or additional evaluation at this point.  The patient is safe and appropriate for discharge and outpatient follow up. [CF]    Clinical Course User Index [CF] Lynnda Sas, MD   FINAL CLINICAL IMPRESSION(S) / ED DIAGNOSES   Final diagnoses:  Shortness of breath  Alcoholic intoxication with complication [name])   Rx / DC Orders   ED Discharge Orders     None      Note:  This document was prepared using Dragon voice recognition software and may include unintentional dictation errors.   Mykel Sponaugle K, MD 03/28/24 [phone]

## 2024-03-26 NOTE — ED Provider Notes (Signed)
-----------------------------------------   11:32 PM on 03/26/2024 -----------------------------------------  Assuming care from Dr. Alejo Amsler.  In short, Douglas Vazguez. is a 73 y.o. male with a chief complaint of SOB.  Refer to the original H&P for additional details.  The current plan of care is to reassess when clinically sober.  Workup has been reassuring.   Clinical Course as of 03/27/24 0430  Sun Mar 27, 2024  0344 [name] awake and alert.  He got 1 mg of p.o. Ativan  per CIWA protocol.  He is asking for more medication but he is awake and alert and oriented.  There is no indication he needs to stay in the emergency department.  I recommended that he call someone to come pick him up and take him home and he said that he agreed and will try to do so. [CF]  [phone] Patient will be transported home via [facility].  The patient's medical screening exam is reassuring with no indication of an emergent medical condition requiring hospitalization or additional evaluation at this point.  The patient is safe and appropriate for discharge and outpatient follow up. [CF]    Clinical Course User Index [CF] Lynnda Sas, MD     Medications  LORazepam  (ATIVAN ) injection 0-4 mg (1 mg Intravenous Given 03/27/24 0009)    Or  LORazepam  (ATIVAN ) tablet 0-4 mg ( Oral See Alternative 03/27/24 0009)  LORazepam  (ATIVAN ) injection 0-4 mg (has no administration in time range)    Or  LORazepam  (ATIVAN ) tablet 0-4 mg (has no administration in time range)  thiamine  (VITAMIN B1) tablet 100 mg (has no administration in time range)    Or  thiamine  (VITAMIN B1) injection 100 mg (has no administration in time range)     ED Discharge Orders     None      Final diagnoses:  Shortness of breath  Alcoholic intoxication with complication (HCC)     Lynnda Sas, MD 03/27/24 0430

## 2024-03-26 NOTE — ED Triage Notes (Signed)
 Pt BIB [facility] from Home for SOB. EMS reports expiratory rhonchi breath sounds with a non-productive cough. Pt initally at 93% RA, placed on 2L [place] with O2 improving to 98%. 127/72, 98HR. Pt smells strongly or urine upon arrival.

## 2024-03-27 NOTE — ED Notes (Signed)
 Assisted NT with providing urinal to pt and changing pt into a new brief.

## 2024-03-27 NOTE — Discharge Instructions (Addendum)
 Your workup in the Emergency Department today was reassuring.  We did not find any specific abnormalities.  We recommend you drink plenty of fluids, take your regular medications and/or any new ones prescribed today, and follow up with the doctor(s) listed in these documents as recommended.  Please decrease the amount of alcohol you are consuming as well, and follow-up with your primary care doctor or at [facility].  Return to the Emergency Department if you develop new or worsening symptoms that concern you.

## 2024-03-29 ENCOUNTER — Other Ambulatory Visit: Payer: Self-pay

## 2024-03-29 ENCOUNTER — Encounter: Payer: Self-pay | Admitting: Emergency Medicine

## 2024-03-29 ENCOUNTER — Emergency Department

## 2024-03-29 ENCOUNTER — Emergency Department
Admission: EM | Admit: 2024-03-29 | Discharge: 2024-03-29 | Disposition: A | Discharge status: Home / Self Care | Attending: Emergency Medicine | Admitting: Emergency Medicine

## 2024-03-29 DIAGNOSIS — F10129 Alcohol abuse with intoxication, unspecified: Secondary | ICD-10-CM | POA: Diagnosis not present

## 2024-03-29 DIAGNOSIS — Y908 Blood alcohol level of 240 mg/100 ml or more: Secondary | ICD-10-CM | POA: Insufficient documentation

## 2024-03-29 DIAGNOSIS — R0602 Shortness of breath: Secondary | ICD-10-CM | POA: Diagnosis present

## 2024-03-29 DIAGNOSIS — E871 Hypo-osmolality and hyponatremia: Secondary | ICD-10-CM | POA: Insufficient documentation

## 2024-03-29 DIAGNOSIS — F419 Anxiety disorder, unspecified: Secondary | ICD-10-CM | POA: Diagnosis not present

## 2024-03-29 DIAGNOSIS — Z79899 Other long term (current) drug therapy: Secondary | ICD-10-CM | POA: Insufficient documentation

## 2024-03-29 DIAGNOSIS — F10929 Alcohol use, unspecified with intoxication, unspecified: Secondary | ICD-10-CM

## 2024-03-29 LAB — CBC WITH DIFFERENTIAL/PLATELET
Abs Immature Granulocytes: 0.03 10*3/uL (ref 0.00–0.07)
Basophils Absolute: 0.1 10*3/uL (ref 0.0–0.1)
Basophils Relative: 1 %
Eosinophils Absolute: 0.1 10*3/uL (ref 0.0–0.5)
Eosinophils Relative: 1 %
HCT: 43 % (ref 39.0–52.0)
Hemoglobin: 14.9 g/dL (ref 13.0–17.0)
Immature Granulocytes: 0 %
Lymphocytes Relative: 47 %
Lymphs Abs: 6.3 10*3/uL — ABNORMAL HIGH (ref 0.7–4.0)
MCH: 31.5 pg (ref 26.0–34.0)
MCHC: 34.7 g/dL (ref 30.0–36.0)
MCV: 90.9 fL (ref 80.0–100.0)
Monocytes Absolute: 0.8 10*3/uL (ref 0.1–1.0)
Monocytes Relative: 6 %
Neutro Abs: 5.9 10*3/uL (ref 1.7–7.7)
Neutrophils Relative %: 45 %
Platelets: 273 10*3/uL (ref 150–400)
RBC: 4.73 MIL/uL (ref 4.22–5.81)
RDW: 13 % (ref 11.5–15.5)
WBC: 13.1 10*3/uL — ABNORMAL HIGH (ref 4.0–10.5)
nRBC: 0 % (ref 0.0–0.2)

## 2024-03-29 LAB — BASIC METABOLIC PANEL WITH GFR
Anion gap: 11 (ref 5–15)
BUN: <5 mg/dL — ABNORMAL LOW (ref 8–23)
CO2: 23 mmol/L (ref 22–32)
Calcium: 8.5 mg/dL — ABNORMAL LOW (ref 8.9–10.3)
Chloride: 92 mmol/L — ABNORMAL LOW (ref 98–111)
Creatinine, Ser: 0.41 mg/dL — ABNORMAL LOW (ref 0.61–1.24)
GFR, Estimated: >60 mL/min (ref >60)
Glucose, Bld: 96 mg/dL (ref 70–99)
Potassium: 4.1 mmol/L (ref 3.5–5.1)
Sodium: 126 mmol/L — ABNORMAL LOW (ref 135–145)

## 2024-03-29 LAB — ETHANOL: Alcohol, Ethyl (B): 236 mg/dL — ABNORMAL HIGH (ref <15)

## 2024-03-29 MED ORDER — CHLORDIAZEPOXIDE HCL 25 MG PO CAPS
25.0000 mg | ORAL_CAPSULE | Freq: Once | ORAL | Status: DC
Start: 2024-03-29 — End: 2024-03-29

## 2024-03-29 MED ORDER — CHLORDIAZEPOXIDE HCL 25 MG PO CAPS
50.0000 mg | ORAL_CAPSULE | Freq: Once | ORAL | Status: AC
  Administered 2024-03-29: 50 mg via ORAL
  Filled 2024-03-29: qty 2

## 2024-03-29 MED ORDER — HYDROXYZINE HCL 25 MG PO TABS
25.0000 mg | ORAL_TABLET | Freq: Once | ORAL | Status: AC
  Administered 2024-03-29: 25 mg via ORAL
  Filled 2024-03-29: qty 1

## 2024-03-29 MED ORDER — SODIUM CHLORIDE 0.9 % IV BOLUS
1000.0000 mL | Freq: Once | INTRAVENOUS | Status: AC
  Administered 2024-03-29: 1000 mL via INTRAVENOUS

## 2024-03-29 MED ORDER — CHLORDIAZEPOXIDE HCL 25 MG PO CAPS: ORAL_CAPSULE | ORAL | 0 refills | Status: DC

## 2024-03-29 NOTE — ED Notes (Signed)
 Pt currently sleeping. NAD at this time with even, unlabored respirations.

## 2024-03-29 NOTE — ED Notes (Addendum)
 RN went to answer patients call light and noted to find tourniquet inadvertently left in place on LUE after an IV catheter was placed in left antecubital vein using aseptic technique. Tourniquet was immediately removed and RN assessed peripheral vascular status. Peripheral vascular status is intact. Skin is appropriate color for ethnicity. Patients capillary refill time is <3 seconds. Patients radial and brachial pulses are 2+. NO signs of bruising, swelling, or circulatory compromise noted. Patient denies pain or numbness. Will continue to monitor site.   Charge nurse Sherian Dimitri notified.

## 2024-03-29 NOTE — ED Notes (Signed)
 Pt stating hasn't eaten in several days. Pt provided with [place] sandwich tray and coke to drink.

## 2024-03-29 NOTE — ED Notes (Addendum)
 Pt sister, Emmit Harold, called with no answer. HIPAA compliant VM left for her to call back.

## 2024-03-29 NOTE — ED Notes (Addendum)
 RN attempted to call both people listed on patients chart at this time. Both phone calls went to voicemail and message was left. RN called Wendy Mallon and Arlis Bent. Will attempt to call back before shift change.

## 2024-03-29 NOTE — Discharge Instructions (Addendum)
 If you want if you want to try and stop drinking, please use the provided prescription.  There are specific instructions about how to use the medication (Librium ) INSTEAD of drinking alcohol (it is important that you do not drink alcohol and take the medication).  Please follow-up with one of the outpatient doctors listed in this paperwork or any outpatient doctors you have seen in the past.  We strongly encourage you to follow-up at [facility] for additional help with your alcohol addiction as well.

## 2024-03-29 NOTE — ED Notes (Signed)
 RN attempted to call both people listed on patients chart at this time. Both phone calls went to voicemail once again.

## 2024-03-29 NOTE — ED Provider Notes (Signed)
 [facility] Provider Note    Event Date/Time   First MD Initiated Contact with Patient 03/29/24 0030     (approximate)   History   Shortness of Breath   HPI Douglas Peterson. is a 73 y.o. male  well known to the ED for frequent visits, usually as a result of chronic alcohol use and sequela.  Recently he has had multiple evening/night visits by EMS for a report of shortness of breath, but once he arrives he reports shaking and requests Ativan .  Today is the same; the patient reports that he is "trying to stop drinking", though he admits he had some alcohol tonight.  He said he felt like he "had some fluid" similar to the way he felt on his last visit.  However, his SOB resolved by the time I saw him, and he said he feels shaky and he wants some Aitvan.  He denies any pain and no longer feels short of breath.      Physical Exam   Triage Vital Signs: ED Triage Vitals  Encounter Vitals Group     BP 03/29/24 0032 (!) 154/79     Systolic BP Percentile --      Diastolic BP Percentile --      Pulse Rate 03/29/24 0032 94     Resp 03/29/24 0032 20     Temp 03/29/24 0032 97.7 F (36.5 C)     Temp Source 03/29/24 0032 Oral     SpO2 03/29/24 0026 96 %     Weight --      Height --      Head Circumference --      Peak Flow --      Pain Score 03/29/24 0029 4     Pain Loc --      Pain Education --      Exclude from Growth Chart --     Most recent vital signs: Vitals:   03/29/24 0330 03/29/24 0345  BP:  134/83  Pulse: 87 89  Resp: 20 19  Temp:    SpO2: 96% 97%    General: Awake and alert, disheveled but at his baseline. CV:  Good peripheral perfusion. Regular rate and rhythm. Resp:  Normal effort. Speaking easily and comfortably, no accessory muscle usage nor intercostal retractions.  Lungs clear to auscultation. Abd:  No distention.  Other:  Chronic contractures, no acute deficits.   ED Results / Procedures / Treatments   Labs (all labs  ordered are listed, but only abnormal results are displayed) Labs Reviewed  CBC WITH DIFFERENTIAL/PLATELET - Abnormal; Notable for the following components:      Result Value   WBC 13.1 (*)    Lymphs Abs 6.3 (*)    All other components within normal limits  BASIC METABOLIC PANEL WITH GFR - Abnormal; Notable for the following components:   Sodium 126 (*)    Chloride 92 (*)    BUN <5 (*)    Creatinine, Ser 0.41 (*)    Calcium  8.5 (*)    All other components within normal limits  ETHANOL - Abnormal; Notable for the following components:   Alcohol, Ethyl (B) 236 (*)    All other components within normal limits     RADIOLOGY I independently viewed and interpreted the patient's 1 view chest x-ray and I see no evidence of pneumonia or pulmonary edema.  Radiologist commented on low inspiratory volumes but generally no acute findings noted.   PROCEDURES:  Critical Care performed:  No  Procedures    IMPRESSION / MDM / ASSESSMENT AND PLAN / ED COURSE  I reviewed the triage vital signs and the nursing notes.                              Differential diagnosis includes, but is not limited to, aspiration, alcohol intoxication, alcohol withdrawal, malingering, anxiety.  Patient's presentation is most consistent with acute presentation with potential threat to life or bodily function.  Labs/studies ordered: 1 view chest x-ray, BMP, CBC with differential, ethanol level  Interventions/Medications given:  Medications  sodium chloride  0.9 % bolus 1,000 mL (0 mLs Intravenous Stopped 03/29/24 0338)  hydrOXYzine  (ATARAX ) tablet 25 mg (25 mg Oral Given 03/29/24 0312)  chlordiazePOXIDE  (LIBRIUM ) capsule 50 mg (50 mg Oral Given 03/29/24 6962)    (Note:  hospital course my include additional interventions and/or labs/studies not listed above.)   Patient seems to be at his baseline.  He has no tremor/tremulousness and no sign of acute alcohol withdrawal.  Additionally, his alcohol level is  still 236.  His basic metabolic panel shows some hyponatremia which I addressed with a liter of normal saline.  He is likely at least a bit hypovolemic due to decreased oral intake and has been substantially hyponatremic in the past.  Although this is a change from his last labs, he remains asymptomatic and I do not feel he would benefit from fluid restriction or admission at this time.  I talked with the patient about his ongoing chronic symptoms and repeated visits to the emergency department.  He claims that he wishes to cut back or stop his alcohol use.  I explained that he does not currently have a medical need for Ativan  (he typically asked for Ativan  consistently infrequently while he is in the emergency department), and that I think that anxiety and dependence is playing a role in his current desire.  However, I will treat him with Librium  if he is serious about wanting to cut back on his alcohol.  I gave him a dose of Librium  in the ED along with some Atarax  and provided a prescription for Librium  to use as an outpatient.  I gave my usual follow-up recommendations and return precautions       FINAL CLINICAL IMPRESSION(S) / ED DIAGNOSES   Final diagnoses:  Alcoholic intoxication with complication (HCC)  Anxiety     Rx / DC Orders   ED Discharge Orders          Ordered    chlordiazePOXIDE  (LIBRIUM ) 25 MG capsule        03/29/24 0310             Note:  This document was prepared using Dragon voice recognition software and may include unintentional dictation errors.   Lynnda Sas, MD 03/29/24 [phone]

## 2024-03-29 NOTE — ED Notes (Signed)
 Pt awake and asking for urinal. Pt able to void in urinal, brief changed, covered with blankets. Informed pt of DC order and attempts to find a ride home.

## 2024-03-29 NOTE — ED Notes (Signed)
 [facility]  called for  transport home

## 2024-03-29 NOTE — ED Notes (Signed)
 Patients brief noted to be soiled at this time. RN changed patients brief and replaced blankets. No new needs determined.

## 2024-03-29 NOTE — ED Notes (Addendum)
 Sister, Adell Hones, called and informed of pt DC. Sister unable to pick up pt as she lives 2 hours away and is elderly. Will speak with Dr. Cleora Daft and Charge RN as to best get pt home.

## 2024-03-29 NOTE — ED Notes (Signed)
 RN entered room to give ordered medications at this time and noted that the patients brief was soiled. RN changed patient and linens and patient was provided with a warm blanket at this time. Patient denies further needs.

## 2024-03-29 NOTE — ED Notes (Signed)
 Spoke to [employer] about how patient normally gets home. She states that he will have to metabolize the alcohol he consumed and then call family to transport him back to his residence.  RN spoke to patient and he stated that he does not have any family to call and that he will need ambulance transport back home. Patient informed that he does not qualify for ambulance transport where he is intoxicated and has no medical necessity. Patient also informed hat RN will attempt to call his daughter who is in his chart as an alternate contact person.

## 2024-03-29 NOTE — ED Triage Notes (Addendum)
 Pt arrived via [facility] from home where pt called out due to increased SOB and non-productive cough that is causing chest pain. Pt admits to ETOH use tonight. Pt reports he was recently hospitalized and diagnosed with pneumonia. EMS reports initial O2 saturation 96% on RA.

## 2024-04-08 ENCOUNTER — Emergency Department
Admission: EM | Admit: 2024-04-08 | Discharge: 2024-04-08 | Disposition: A | Discharge status: Home / Self Care | Source: Home / Self Care | Attending: Emergency Medicine | Admitting: Emergency Medicine

## 2024-04-08 ENCOUNTER — Emergency Department

## 2024-04-08 ENCOUNTER — Other Ambulatory Visit: Payer: Self-pay

## 2024-04-08 DIAGNOSIS — F10231 Alcohol dependence with withdrawal delirium: Secondary | ICD-10-CM | POA: Diagnosis not present

## 2024-04-08 DIAGNOSIS — F1010 Alcohol abuse, uncomplicated: Secondary | ICD-10-CM

## 2024-04-08 DIAGNOSIS — F10120 Alcohol abuse with intoxication, uncomplicated: Secondary | ICD-10-CM | POA: Insufficient documentation

## 2024-04-08 DIAGNOSIS — Y908 Blood alcohol level of 240 mg/100 ml or more: Secondary | ICD-10-CM | POA: Insufficient documentation

## 2024-04-08 DIAGNOSIS — I509 Heart failure, unspecified: Secondary | ICD-10-CM | POA: Insufficient documentation

## 2024-04-08 LAB — CBC
HCT: 47.4 % (ref 39.0–52.0)
Hemoglobin: 16.3 g/dL (ref 13.0–17.0)
MCH: 31.2 pg (ref 26.0–34.0)
MCHC: 34.4 g/dL (ref 30.0–36.0)
MCV: 90.6 fL (ref 80.0–100.0)
Platelets: 208 10*3/uL (ref 150–400)
RBC: 5.23 MIL/uL (ref 4.22–5.81)
RDW: 13 % (ref 11.5–15.5)
WBC: 9.4 10*3/uL (ref 4.0–10.5)
nRBC: 0 % (ref 0.0–0.2)

## 2024-04-08 LAB — COMPREHENSIVE METABOLIC PANEL WITH GFR
ALT: 77 U/L — ABNORMAL HIGH (ref 0–44)
AST: 105 U/L — ABNORMAL HIGH (ref 15–41)
Albumin: 4.3 g/dL (ref 3.5–5.0)
Alkaline Phosphatase: 136 U/L — ABNORMAL HIGH (ref 38–126)
Anion gap: 12 (ref 5–15)
BUN: <5 mg/dL — ABNORMAL LOW (ref 8–23)
CO2: 29 mmol/L (ref 22–32)
Calcium: 8.7 mg/dL — ABNORMAL LOW (ref 8.9–10.3)
Chloride: 95 mmol/L — ABNORMAL LOW (ref 98–111)
Creatinine, Ser: 0.59 mg/dL — ABNORMAL LOW (ref 0.61–1.24)
GFR, Estimated: >60 mL/min (ref >60)
Glucose, Bld: 106 mg/dL — ABNORMAL HIGH (ref 70–99)
Potassium: 3.7 mmol/L (ref 3.5–5.1)
Sodium: 136 mmol/L (ref 135–145)
Total Bilirubin: 0.8 mg/dL (ref 0.0–1.2)
Total Protein: 8.2 g/dL — ABNORMAL HIGH (ref 6.5–8.1)

## 2024-04-08 LAB — TROPONIN I (HIGH SENSITIVITY): Troponin I (High Sensitivity): 5 ng/L (ref <18)

## 2024-04-08 LAB — CK: Total CK: 138 U/L (ref 49–397)

## 2024-04-08 LAB — ETHANOL: Alcohol, Ethyl (B): 306 mg/dL (ref <15)

## 2024-04-08 LAB — LIPASE, BLOOD: Lipase: 45 U/L (ref 11–51)

## 2024-04-08 MED ORDER — CLOTRIMAZOLE 1 % EX CREA
TOPICAL_CREAM | Freq: Two times a day (BID) | CUTANEOUS | Status: DC
  Filled 2024-04-08: qty 15

## 2024-04-08 MED ORDER — ONDANSETRON HCL 4 MG/2ML IJ SOLN
4.0000 mg | Freq: Once | INTRAMUSCULAR | Status: AC
  Administered 2024-04-08: 4 mg via INTRAVENOUS
  Filled 2024-04-08: qty 2

## 2024-04-08 MED ORDER — LORAZEPAM 2 MG/ML IJ SOLN
1.0000 mg | Freq: Once | INTRAMUSCULAR | Status: AC
  Administered 2024-04-08: 1 mg via INTRAVENOUS
  Filled 2024-04-08: qty 1

## 2024-04-08 MED ORDER — SODIUM CHLORIDE 0.9 % IV SOLN: Freq: Once | INTRAVENOUS | Status: AC

## 2024-04-08 MED ORDER — ACETAMINOPHEN 325 MG PO TABS
650.0000 mg | ORAL_TABLET | Freq: Once | ORAL | Status: AC
  Administered 2024-04-08: 650 mg via ORAL
  Filled 2024-04-08: qty 2

## 2024-04-08 MED ORDER — CHLORDIAZEPOXIDE HCL 5 MG PO CAPS
10.0000 mg | ORAL_CAPSULE | Freq: Once | ORAL | Status: AC
  Administered 2024-04-08: 10 mg via ORAL
  Filled 2024-04-08: qty 2

## 2024-04-08 NOTE — ED Notes (Signed)
 Unsuccessful IV attempt by this RN.

## 2024-04-08 NOTE — ED Triage Notes (Signed)
 Pt to ED via [facility] from home. EMS called due to etoh intoxication. Pt was drinking wine on EMS arrival. Pt reports has been drinking for 10 days straight. Pt has rales throughout lung filed. CBG 377. 94% RA placed on 2L. EMS unable to get IV access. Pt covered in feces and urine on arrival. Pt reports pain in sacrum. Pt denies falls.

## 2024-04-08 NOTE — ED Provider Notes (Addendum)
 [facility] Provider Note    Event Date/Time   First MD Initiated Contact with Patient 04/08/24 1000     (approximate)   History   Alcohol Intoxication   HPI  Douglas Peterson. is a 73 y.o. male with known history of alcoholism, CHF with frequent visits to the emergency department who called EMS secondary to alcohol intoxication.  He reports he has been drinking for the last week or longer.  Apparently oxygenation was in the low 90s so he was started on 2 L nasal cannula.     Physical Exam   Triage Vital Signs: ED Triage Vitals  Encounter Vitals Group     BP 04/08/24 0848 (!) 140/93     Systolic BP Percentile --      Diastolic BP Percentile --      Pulse Rate 04/08/24 0848 (!) 108     Resp 04/08/24 0848 20     Temp 04/08/24 0848 98.5 F (36.9 C)     Temp Source 04/08/24 0848 Oral     SpO2 04/08/24 0848 94 %     Weight 04/08/24 0850 61.2 kg (135 lb)     Height 04/08/24 0850 1.6 m (5\' 3" )     Head Circumference --      Peak Flow --      Pain Score 04/08/24 0850 5     Pain Loc --      Pain Education --      Exclude from Growth Chart --     Most recent vital signs: Vitals:   04/08/24 1230 04/08/24 1330  BP: (!) 145/83 139/84  Pulse: 98   Resp: 17 17  Temp: 98.6 F (37 C)   SpO2: 97%      General: Awake, no distress.  CV:  Good peripheral perfusion.  Mild tachycardia Resp:  Normal effort.  Bibasilar Rales Abd:  No distention.  Soft, nontender Other:  Mild erythema to the buttocks and right lower abdomen most consistent with fungal infection.  Will apply clotrimazole  cream   ED Results / Procedures / Treatments   Labs (all labs ordered are listed, but only abnormal results are displayed) Labs Reviewed  COMPREHENSIVE METABOLIC PANEL WITH GFR - Abnormal; Notable for the following components:      Result Value   Chloride 95 (*)    Glucose, Bld 106 (*)    BUN <5 (*)    Creatinine, Ser 0.59 (*)    Calcium  8.7 (*)    Total  Protein 8.2 (*)    AST 105 (*)    ALT 77 (*)    Alkaline Phosphatase 136 (*)    All other components within normal limits  ETHANOL - Abnormal; Notable for the following components:   Alcohol, Ethyl (B) 306 (*)    All other components within normal limits  CBC  LIPASE, BLOOD  CK  URINE DRUG SCREEN, QUALITATIVE ([facility] ONLY)  TROPONIN I (HIGH SENSITIVITY)     EKG  ED ECG REPORT I, Bryson Carbine, the attending physician, personally viewed and interpreted this ECG.  Date: 04/08/2024  Rhythm: normal sinus rhythm QRS Axis: normal Intervals: normal ST/T Wave abnormalities: normal Narrative Interpretation: Slight wander    RADIOLOGY Chest x-ray without acute abnormality    PROCEDURES:  Critical Care performed:   Procedures   MEDICATIONS ORDERED IN ED: Medications  clotrimazole  (LOTRIMIN ) 1 % cream ( Topical Given 04/08/24 1219)  ondansetron  (ZOFRAN ) injection 4 mg (has no administration in time range)  chlordiazePOXIDE  (  LIBRIUM ) capsule 10 mg (has no administration in time range)  0.9 %  sodium chloride  infusion ( Intravenous New Bag/Given 04/08/24 1212)  ondansetron  (ZOFRAN ) injection 4 mg (4 mg Intravenous Given 04/08/24 1217)  acetaminophen  (TYLENOL ) tablet 650 mg (650 mg Oral Given 04/08/24 1215)  LORazepam  (ATIVAN ) injection 1 mg (1 mg Intravenous Given 04/08/24 1216)     IMPRESSION / MDM / ASSESSMENT AND PLAN / ED COURSE  I reviewed the triage vital signs and the nursing notes. Patient's presentation is most consistent with acute presentation with potential threat to life or bodily function.  Patient presents with likely alcohol intoxication.    Mild skin breakdown noted to the buttocks.  Labs pending, will treat with IV fluids, IV Zofran   Well-known to our emergency department.  Review of record demonstrates the patient has had multiple episodes of social work evaluation but each time he has opted for discharge home.  ----------------------------------------- 1:59  PM on 04/08/2024 ----------------------------------------- [name] not feeling improved, no longer nauseated after nausea medications.  Workup is overall reassuring, no signs of acute alcohol withdrawal, no clear indication for admission at this time, will arrange for discharge      FINAL CLINICAL IMPRESSION(S) / ED DIAGNOSES   Final diagnoses:  Alcohol abuse     Rx / DC Orders   ED Discharge Orders     None        Note:  This document was prepared using Dragon voice recognition software and may include unintentional dictation errors.   Bryson Carbine, MD 04/08/24 1359    Bryson Carbine, MD 04/08/24 [phone]

## 2024-04-08 NOTE — ED Notes (Signed)
 This RN provided a partial bed bath to pt after presenting to the room covered in dried feces. New brief applied. Red rash noted under abdomen, chest, on both buttocks, lower thighs and sides of quads. One single skin tear noted on R posterior thigh. Pt tolerated well. Warm blankets given. Cb within reach.

## 2024-04-08 NOTE — ED Notes (Signed)
 This RN attempted IV stick and lab draw twice. Lab called to draw labs and IV team consulted for Line placement.

## 2024-04-09 ENCOUNTER — Inpatient Hospital Stay
Admission: EM | Admit: 2024-04-09 | Discharge: 2024-04-15 | DRG: 897 | Disposition: A | Discharge status: Home Health | Attending: Family Medicine | Admitting: Family Medicine

## 2024-04-09 DIAGNOSIS — F10229 Alcohol dependence with intoxication, unspecified: Secondary | ICD-10-CM | POA: Diagnosis present

## 2024-04-09 DIAGNOSIS — F10231 Alcohol dependence with withdrawal delirium: Secondary | ICD-10-CM | POA: Diagnosis present

## 2024-04-09 DIAGNOSIS — E559 Vitamin D deficiency, unspecified: Secondary | ICD-10-CM | POA: Diagnosis present

## 2024-04-09 DIAGNOSIS — E785 Hyperlipidemia, unspecified: Secondary | ICD-10-CM | POA: Diagnosis present

## 2024-04-09 DIAGNOSIS — Z681 Body mass index (BMI) 19 or less, adult: Secondary | ICD-10-CM

## 2024-04-09 DIAGNOSIS — R7989 Other specified abnormal findings of blood chemistry: Secondary | ICD-10-CM | POA: Diagnosis present

## 2024-04-09 DIAGNOSIS — F10931 Alcohol use, unspecified with withdrawal delirium: Secondary | ICD-10-CM | POA: Diagnosis not present

## 2024-04-09 DIAGNOSIS — Z888 Allergy status to other drugs, medicaments and biological substances status: Secondary | ICD-10-CM

## 2024-04-09 DIAGNOSIS — R636 Underweight: Secondary | ICD-10-CM | POA: Diagnosis present

## 2024-04-09 DIAGNOSIS — I110 Hypertensive heart disease with heart failure: Secondary | ICD-10-CM | POA: Diagnosis present

## 2024-04-09 DIAGNOSIS — K5900 Constipation, unspecified: Secondary | ICD-10-CM | POA: Diagnosis not present

## 2024-04-09 DIAGNOSIS — G8191 Hemiplegia, unspecified affecting right dominant side: Secondary | ICD-10-CM | POA: Diagnosis present

## 2024-04-09 DIAGNOSIS — F419 Anxiety disorder, unspecified: Secondary | ICD-10-CM | POA: Diagnosis present

## 2024-04-09 DIAGNOSIS — Z9049 Acquired absence of other specified parts of digestive tract: Secondary | ICD-10-CM | POA: Diagnosis not present

## 2024-04-09 DIAGNOSIS — Z8249 Family history of ischemic heart disease and other diseases of the circulatory system: Secondary | ICD-10-CM

## 2024-04-09 DIAGNOSIS — K219 Gastro-esophageal reflux disease without esophagitis: Secondary | ICD-10-CM | POA: Diagnosis present

## 2024-04-09 DIAGNOSIS — I10 Essential (primary) hypertension: Secondary | ICD-10-CM | POA: Diagnosis present

## 2024-04-09 DIAGNOSIS — F32A Depression, unspecified: Secondary | ICD-10-CM | POA: Diagnosis present

## 2024-04-09 DIAGNOSIS — I480 Paroxysmal atrial fibrillation: Secondary | ICD-10-CM | POA: Diagnosis present

## 2024-04-09 DIAGNOSIS — I509 Heart failure, unspecified: Secondary | ICD-10-CM | POA: Diagnosis present

## 2024-04-09 DIAGNOSIS — S069XAS Unspecified intracranial injury with loss of consciousness status unknown, sequela: Secondary | ICD-10-CM | POA: Diagnosis not present

## 2024-04-09 DIAGNOSIS — E876 Hypokalemia: Secondary | ICD-10-CM | POA: Diagnosis present

## 2024-04-09 DIAGNOSIS — F10939 Alcohol use, unspecified with withdrawal, unspecified: Secondary | ICD-10-CM | POA: Diagnosis present

## 2024-04-09 DIAGNOSIS — K7010 Alcoholic hepatitis without ascites: Secondary | ICD-10-CM | POA: Diagnosis present

## 2024-04-09 DIAGNOSIS — Z87891 Personal history of nicotine dependence: Secondary | ICD-10-CM | POA: Diagnosis not present

## 2024-04-09 DIAGNOSIS — Z79899 Other long term (current) drug therapy: Secondary | ICD-10-CM | POA: Diagnosis not present

## 2024-04-09 DIAGNOSIS — F1020 Alcohol dependence, uncomplicated: Secondary | ICD-10-CM | POA: Diagnosis present

## 2024-04-09 DIAGNOSIS — Z801 Family history of malignant neoplasm of trachea, bronchus and lung: Secondary | ICD-10-CM

## 2024-04-09 DIAGNOSIS — K7030 Alcoholic cirrhosis of liver without ascites: Secondary | ICD-10-CM | POA: Diagnosis present

## 2024-04-09 LAB — COMPREHENSIVE METABOLIC PANEL WITH GFR
ALT: 58 U/L — ABNORMAL HIGH (ref 0–44)
AST: 76 U/L — ABNORMAL HIGH (ref 15–41)
Albumin: 3.8 g/dL (ref 3.5–5.0)
Alkaline Phosphatase: 113 U/L (ref 38–126)
Anion gap: 9 (ref 5–15)
BUN: <5 mg/dL — ABNORMAL LOW (ref 8–23)
CO2: 27 mmol/L (ref 22–32)
Calcium: 8.3 mg/dL — ABNORMAL LOW (ref 8.9–10.3)
Chloride: 99 mmol/L (ref 98–111)
Creatinine, Ser: 0.65 mg/dL (ref 0.61–1.24)
GFR, Estimated: >60 mL/min (ref >60)
Glucose, Bld: 94 mg/dL (ref 70–99)
Potassium: 3.4 mmol/L — ABNORMAL LOW (ref 3.5–5.1)
Sodium: 135 mmol/L (ref 135–145)
Total Bilirubin: 1.1 mg/dL (ref 0.0–1.2)
Total Protein: 7.1 g/dL (ref 6.5–8.1)

## 2024-04-09 LAB — CBC
HCT: 43 % (ref 39.0–52.0)
Hemoglobin: 14.6 g/dL (ref 13.0–17.0)
MCH: 31.1 pg (ref 26.0–34.0)
MCHC: 34 g/dL (ref 30.0–36.0)
MCV: 91.7 fL (ref 80.0–100.0)
Platelets: 153 10*3/uL (ref 150–400)
RBC: 4.69 MIL/uL (ref 4.22–5.81)
RDW: 12.9 % (ref 11.5–15.5)
WBC: 5.9 10*3/uL (ref 4.0–10.5)
nRBC: 0 % (ref 0.0–0.2)

## 2024-04-09 LAB — MAGNESIUM: Magnesium: 2 mg/dL (ref 1.7–2.4)

## 2024-04-09 LAB — ETHANOL: Alcohol, Ethyl (B): 214 mg/dL — ABNORMAL HIGH (ref <15)

## 2024-04-09 MED ORDER — SODIUM CHLORIDE 0.9 % IV SOLN: Freq: Once | INTRAVENOUS | Status: AC

## 2024-04-09 MED ORDER — FOLIC ACID 1 MG PO TABS: 1.0000 mg | ORAL_TABLET | Freq: Every day | ORAL | Status: DC

## 2024-04-09 MED ORDER — THIAMINE MONONITRATE 100 MG PO TABS
100.0000 mg | ORAL_TABLET | Freq: Every day | ORAL | Status: DC
Start: 2024-04-09 — End: 2024-04-09

## 2024-04-09 MED ORDER — ONDANSETRON HCL 4 MG/2ML IJ SOLN
4.0000 mg | Freq: Once | INTRAMUSCULAR | Status: AC
  Administered 2024-04-09: 4 mg via INTRAVENOUS
  Filled 2024-04-09: qty 2

## 2024-04-09 MED ORDER — THIAMINE HCL 100 MG/ML IJ SOLN
100.0000 mg | Freq: Every day | INTRAMUSCULAR | Status: DC
  Administered 2024-04-13: 100 mg via INTRAVENOUS
  Filled 2024-04-09 (×4): qty 2

## 2024-04-09 MED ORDER — LORAZEPAM 2 MG PO TABS: 0.0000 mg | ORAL_TABLET | Freq: Two times a day (BID) | ORAL | Status: DC

## 2024-04-09 MED ORDER — LORAZEPAM 2 MG/ML IJ SOLN
0.0000 mg | Freq: Four times a day (QID) | INTRAMUSCULAR | Status: AC
  Administered 2024-04-09 – 2024-04-10 (×3): 1 mg via INTRAVENOUS
  Filled 2024-04-09 (×3): qty 1

## 2024-04-09 MED ORDER — POLYETHYLENE GLYCOL 3350 17 G PO PACK
17.0000 g | PACK | Freq: Every day | ORAL | Status: DC | PRN
  Administered 2024-04-10 – 2024-04-11 (×2): 17 g via ORAL
  Filled 2024-04-09 (×2): qty 1

## 2024-04-09 MED ORDER — ONDANSETRON HCL 4 MG PO TABS
4.0000 mg | ORAL_TABLET | Freq: Four times a day (QID) | ORAL | Status: DC | PRN
  Administered 2024-04-12 – 2024-04-14 (×3): 4 mg via ORAL
  Filled 2024-04-09 (×3): qty 1

## 2024-04-09 MED ORDER — LORAZEPAM 2 MG/ML IJ SOLN
1.0000 mg | INTRAMUSCULAR | Status: AC | PRN
  Administered 2024-04-11: 1 mg via INTRAVENOUS
  Filled 2024-04-09: qty 1
  Filled 2024-04-09: qty 2
  Filled 2024-04-09: qty 1

## 2024-04-09 MED ORDER — LORAZEPAM 1 MG PO TABS
1.0000 mg | ORAL_TABLET | ORAL | Status: AC | PRN
  Administered 2024-04-11: 3 mg via ORAL
  Administered 2024-04-12: 1 mg via ORAL
  Filled 2024-04-09: qty 3
  Filled 2024-04-09: qty 1

## 2024-04-09 MED ORDER — ADULT MULTIVITAMIN W/MINERALS CH
1.0000 | ORAL_TABLET | Freq: Every day | ORAL | Status: DC
  Administered 2024-04-09 – 2024-04-15 (×7): 1 via ORAL
  Filled 2024-04-09 (×7): qty 1

## 2024-04-09 MED ORDER — LORAZEPAM 2 MG PO TABS: 0.0000 mg | ORAL_TABLET | Freq: Four times a day (QID) | ORAL | Status: AC

## 2024-04-09 MED ORDER — ENOXAPARIN SODIUM 40 MG/0.4ML IJ SOSY
40.0000 mg | PREFILLED_SYRINGE | INTRAMUSCULAR | Status: DC
  Administered 2024-04-09 – 2024-04-14 (×6): 40 mg via SUBCUTANEOUS
  Filled 2024-04-09 (×6): qty 0.4

## 2024-04-09 MED ORDER — THIAMINE MONONITRATE 100 MG PO TABS: 100.0000 mg | ORAL_TABLET | Freq: Every day | ORAL | Status: DC

## 2024-04-09 MED ORDER — ONDANSETRON HCL 4 MG/2ML IJ SOLN
4.0000 mg | Freq: Four times a day (QID) | INTRAMUSCULAR | Status: DC | PRN
  Administered 2024-04-11 – 2024-04-13 (×3): 4 mg via INTRAVENOUS
  Filled 2024-04-09 (×3): qty 2

## 2024-04-09 MED ORDER — ACETAMINOPHEN 325 MG PO TABS
650.0000 mg | ORAL_TABLET | Freq: Four times a day (QID) | ORAL | Status: AC | PRN
Start: 2024-04-09 — End: ?
  Administered 2024-04-09 – 2024-04-12 (×5): 650 mg via ORAL
  Filled 2024-04-09 (×5): qty 2

## 2024-04-09 MED ORDER — PHENOBARBITAL SODIUM 130 MG/ML IJ SOLN
130.0000 mg | Freq: Two times a day (BID) | INTRAMUSCULAR | Status: AC
  Administered 2024-04-09: 130 mg via INTRAVENOUS
  Filled 2024-04-09: qty 1

## 2024-04-09 MED ORDER — ACETAMINOPHEN 650 MG RE SUPP: 650.0000 mg | Freq: Four times a day (QID) | RECTAL | Status: DC | PRN

## 2024-04-09 MED ORDER — THIAMINE MONONITRATE 100 MG PO TABS
100.0000 mg | ORAL_TABLET | Freq: Every day | ORAL | Status: DC
  Administered 2024-04-09 – 2024-04-15 (×6): 100 mg via ORAL
  Filled 2024-04-09 (×7): qty 1

## 2024-04-09 MED ORDER — LORAZEPAM 2 MG/ML IJ SOLN: 0.0000 mg | Freq: Two times a day (BID) | INTRAMUSCULAR | Status: DC

## 2024-04-09 MED ORDER — THIAMINE HCL 100 MG/ML IJ SOLN: 100.0000 mg | Freq: Every day | INTRAMUSCULAR | Status: DC

## 2024-04-09 NOTE — Assessment & Plan Note (Signed)
 Noted. The patient is aware that it is strongly in his favor to stop drinking.

## 2024-04-09 NOTE — Assessment & Plan Note (Signed)
 LFT's are minimally elevated. Monitor. Likely due to alcoholic hepatitis.

## 2024-04-09 NOTE — Assessment & Plan Note (Signed)
 The patient will be receiving ativan  with the CIWA protocol. He should be started on treatment for anxiety at the conclusion of his withdrawal.

## 2024-04-09 NOTE — Assessment & Plan Note (Signed)
 The patient carries the diagnosis of atrial fibrillation, although his only rate control medication is atenolol . The EKG performed on this presentation is degraded by artifact, although the rate is too regular to be considered atrial fibrillation. The EKG from 5.25.2025, however, is clearly sinus rhythm. The patient will be continued on the atenolol  and monitored on telemetry. He may devolve into AF due to withdrawal.

## 2024-04-09 NOTE — ED Provider Notes (Signed)
 [facility] Provider Note    Event Date/Time   First MD Initiated Contact with Patient 04/09/24 [phone]     (approximate)   History   Emesis and Alcohol Intoxication   HPI  Douglas Sickinger Jr. is a 73 y.o. male with a history of alcoholism, seen by me yesterday for same complaint presents with complaints of alcohol intoxication.  He reports he feels sick after drinking 4 bottles of wine.  He states that he wants help and is concerned about severe detox     Physical Exam   Triage Vital Signs: ED Triage Vitals  Encounter Vitals Group     BP 04/09/24 0806 (!) 156/88     Systolic BP Percentile --      Diastolic BP Percentile --      Pulse Rate 04/09/24 0806 84     Resp 04/09/24 0806 14     Temp 04/09/24 0806 97.9 F (36.6 C)     Temp Source 04/09/24 0806 Oral     SpO2 04/09/24 0806 96 %     Weight --      Height --      Head Circumference --      Peak Flow --      Pain Score 04/09/24 0803 10     Pain Loc --      Pain Education --      Exclude from Growth Chart --     Most recent vital signs: Vitals:   04/09/24 1300 04/09/24 1414  BP: (!) 144/84   Pulse: 74 98  Resp: 15   Temp:    SpO2: 97%      General: Awake, no distress.  CV:  Good peripheral perfusion.  Resp:  Normal effort.  Abd:  No distention.  Other:     ED Results / Procedures / Treatments   Labs (all labs ordered are listed, but only abnormal results are displayed) Labs Reviewed  COMPREHENSIVE METABOLIC PANEL WITH GFR - Abnormal; Notable for the following components:      Result Value   Potassium 3.4 (*)    BUN <5 (*)    Calcium  8.3 (*)    AST 76 (*)    ALT 58 (*)    All other components within normal limits  ETHANOL - Abnormal; Notable for the following components:   Alcohol, Ethyl (B) 214 (*)    All other components within normal limits  CBC     EKG     RADIOLOGY     PROCEDURES:  Critical Care performed:   Procedures   MEDICATIONS ORDERED IN  ED: Medications  LORazepam  (ATIVAN ) injection 0-4 mg (has no administration in time range)    Or  LORazepam  (ATIVAN ) tablet 0-4 mg (has no administration in time range)  LORazepam  (ATIVAN ) injection 0-4 mg (has no administration in time range)    Or  LORazepam  (ATIVAN ) tablet 0-4 mg (has no administration in time range)  thiamine  (VITAMIN B1) tablet 100 mg (has no administration in time range)    Or  thiamine  (VITAMIN B1) injection 100 mg (has no administration in time range)  0.9 %  sodium chloride  infusion (0 mLs Intravenous Stopped 04/09/24 1252)  ondansetron  (ZOFRAN ) injection 4 mg (4 mg Intravenous Given 04/09/24 1011)     IMPRESSION / MDM / ASSESSMENT AND PLAN / ED COURSE  I reviewed the triage vital signs and the nursing notes. Patient's presentation is most consistent with severe exacerbation of chronic illness.  Patient with known  history of alcohol dependence presents with reports of alcohol intoxication, he is feeling nauseated and weak.  Just seen and discharged yesterday, will give IV fluids, IV Zofran , obtain labs and reevaluate.  Lab work not significantly changed from yesterday we will continue IV fluids and Zofran  for continued nausea  ----------------------------------------- 2:18 PM on 04/09/2024 ----------------------------------------- [name] complaining of shakiness, his heart rate is mildly elevated, he is requesting assistance with alcohol detox, he has had alcohol withdrawal delirium before.  Given this we will consult the hospitalist for admission for alcohol withdrawal      FINAL CLINICAL IMPRESSION(S) / ED DIAGNOSES   Final diagnoses:  Alcohol withdrawal syndrome, with delirium (HCC)     Rx / DC Orders   ED Discharge Orders     None        Note:  This document was prepared using Dragon voice recognition software and may include unintentional dictation errors.   Bryson Carbine, MD 04/09/24 [phone]

## 2024-04-09 NOTE — Assessment & Plan Note (Signed)
 The patient's blood pressure has been normo-tensive to somewhat hypertensive since admission. Will continue amlodipine  5 mg daily and atenolol  25 mg po daily as outpatient.

## 2024-04-09 NOTE — Assessment & Plan Note (Signed)
 The patient states that he wishes to become sober and stay sober. He acknowledges that he will need help. TOC will be consulted to supply the patient with resources that he can reach out to.

## 2024-04-09 NOTE — Assessment & Plan Note (Signed)
 The patient will be given phenobarbital  130 mg twice today, the first day of his withdrawal to hopefully reduce the severity and length of his withdrawal. He will also be on a CIWA protocol with ativan . He is being given Folate and thiamine . TOC will be consulted to provide the patient with resources where he may find help with sobriety.
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
## 2024-04-09 NOTE — Assessment & Plan Note (Signed)
 Potassium 3.4 upon admission. Supplemented in ED. Magnesium  2.0. Will monitor.

## 2024-04-09 NOTE — ED Triage Notes (Signed)
 Pt arrives via [facility] for N/V. Pt was seen yesterday for alcohol intoxication and after discharge reportedly went at drank an additional four bottles of wine. No meds given PTA. Pt reports during triage "I want alcohol".

## 2024-04-10 DIAGNOSIS — F1020 Alcohol dependence, uncomplicated: Secondary | ICD-10-CM | POA: Diagnosis not present

## 2024-04-10 LAB — COMPREHENSIVE METABOLIC PANEL WITH GFR
ALT: 47 U/L — ABNORMAL HIGH (ref 0–44)
AST: 71 U/L — ABNORMAL HIGH (ref 15–41)
Albumin: 3.5 g/dL (ref 3.5–5.0)
Alkaline Phosphatase: 102 U/L (ref 38–126)
Anion gap: 6 (ref 5–15)
BUN: 10 mg/dL (ref 8–23)
CO2: 26 mmol/L (ref 22–32)
Calcium: 8.8 mg/dL — ABNORMAL LOW (ref 8.9–10.3)
Chloride: 105 mmol/L (ref 98–111)
Creatinine, Ser: 0.59 mg/dL — ABNORMAL LOW (ref 0.61–1.24)
GFR, Estimated: >60 mL/min (ref >60)
Glucose, Bld: 94 mg/dL (ref 70–99)
Potassium: 3.4 mmol/L — ABNORMAL LOW (ref 3.5–5.1)
Sodium: 137 mmol/L (ref 135–145)
Total Bilirubin: 1.7 mg/dL — ABNORMAL HIGH (ref 0.0–1.2)
Total Protein: 6.5 g/dL (ref 6.5–8.1)

## 2024-04-10 LAB — CBC
HCT: 39.7 % (ref 39.0–52.0)
Hemoglobin: 13.5 g/dL (ref 13.0–17.0)
MCH: 30.9 pg (ref 26.0–34.0)
MCHC: 34 g/dL (ref 30.0–36.0)
MCV: 90.8 fL (ref 80.0–100.0)
Platelets: 102 10*3/uL — ABNORMAL LOW (ref 150–400)
RBC: 4.37 MIL/uL (ref 4.22–5.81)
RDW: 12.4 % (ref 11.5–15.5)
WBC: 4.4 10*3/uL (ref 4.0–10.5)
nRBC: 0 % (ref 0.0–0.2)

## 2024-04-10 MED ORDER — PANTOPRAZOLE SODIUM 40 MG PO TBEC
40.0000 mg | DELAYED_RELEASE_TABLET | Freq: Every day | ORAL | Status: DC
  Administered 2024-04-10 – 2024-04-15 (×6): 40 mg via ORAL
  Filled 2024-04-10 (×6): qty 1

## 2024-04-10 MED ORDER — ALUM & MAG HYDROXIDE-SIMETH 200-200-20 MG/5ML PO SUSP
30.0000 mL | ORAL | Status: AC
  Administered 2024-04-10: 30 mL via ORAL
  Filled 2024-04-10: qty 30

## 2024-04-10 MED ORDER — BACLOFEN 10 MG PO TABS
5.0000 mg | ORAL_TABLET | Freq: Three times a day (TID) | ORAL | Status: DC
  Administered 2024-04-10 – 2024-04-15 (×15): 5 mg via ORAL
  Filled 2024-04-10 (×15): qty 1

## 2024-04-10 MED ORDER — SUCRALFATE 1 G PO TABS
1.0000 g | ORAL_TABLET | Freq: Three times a day (TID) | ORAL | Status: DC
  Administered 2024-04-10 – 2024-04-15 (×20): 1 g via ORAL
  Filled 2024-04-10 (×20): qty 1

## 2024-04-10 MED ORDER — ATENOLOL 25 MG PO TABS
25.0000 mg | ORAL_TABLET | Freq: Every day | ORAL | Status: DC
  Administered 2024-04-10 – 2024-04-15 (×6): 25 mg via ORAL
  Filled 2024-04-10 (×6): qty 1

## 2024-04-10 NOTE — Hospital Course (Addendum)
 [name]. Is a 73 y.o. male with history of alcohol dependence, frequent admissions for alcohol withdrawal, A-fib, hypertension, anxiety, who presented to the hospital on this admission with acute alcohol intoxication and requested inpatient alcohol detox.  Patient completed withdrawal during this admission without complication.  Stay was prolonged pending outpatient care arrangements.  TOC was consulted.  Patient does have some care complexities given poor follow-up in the past.  Home health services were arranged prior to discharge.  The importance of close follow-up with primary care physician was reiterated to him.  Please see TOC notes for more detail.  Today at discharge patient reported feeling ready to discharge and back to his physiologic baseline.  Acute alcohol intoxication EtOH dependence Alcohol withdrawal - Admitted for elective inpatient alcohol detox - Admission alcohol 214 - Status post phenobarb 130 mg twice daily x 1 day on day of admission.  Completed as needed Ativan .  Last Ativan  given 6/10 - No evidence of withdrawal on day of discharge -have counseled on cessation   Hypokalemia - replaced.   HTN At goal, continue current medications  PAF Continue current medications.  Not on anticoagulation outpatient.  High fall risk.   Alcoholic cirrhosis Modestly elevated LFTs Cirrhosis is compensated, no evidence of ascites LFTs have resulted normal Would benefit from outpatient follow-up with GI for surveillance   Vitamin D  insufficiency:  started vitamin D  50,000 units p.o. weekly, follow with PCP to repeat vitamin D  level after 3 to 6 months.   Constipation, resolved  Depression Anxiety - Venlafaxine  started on this admission.  Will prescribe at current dose at discharge  Poor medication adherence - Patient has been loss to follow-up with PCP on multiple occasions.  Frequent readmissions to ED.  TOC consulted to assist, home health arrangements have been  made.  Patient will require follow-up with PCP to continue receiving home health services.  This was communicated to him.

## 2024-04-10 NOTE — Progress Notes (Signed)
 Mobility Specialist - Progress Note   04/10/24 0953  Mobility  Activity Ambulated with assistance in room;Stood at bedside;Dangled on edge of bed  Level of Assistance Contact guard assist, steadying assist  Assistive Device Cane  Distance Ambulated (ft) 20 ft  Activity Response Tolerated well  Mobility Referral Yes  Mobility visit 1 Mobility  Mobility Specialist Start Time (ACUTE ONLY) 0931  Mobility Specialist Stop Time (ACUTE ONLY) [id number]  Mobility Specialist Time Calculation (min) (ACUTE ONLY) 21 min   Pt semi-supine in bed on RA upon arrival. Pt completes bed mobility HHA with extra time. Pt STS and ambulates to/from window X2 and to recliner. Pt left in recliner with needs in reach and chair alarm activated.   Wash Hack  Mobility Specialist  04/10/24 9:54 AM

## 2024-04-10 NOTE — Progress Notes (Signed)
 Mobility Specialist - Progress Note   04/10/24 1045  Mobility  Activity Ambulated with assistance in room;Transferred from chair to bed  Level of Assistance Contact guard assist, steadying assist  Assistive Device Cane  Distance Ambulated (ft) 5 ft  Activity Response Tolerated well  Mobility Referral Yes  Mobility visit 1 Mobility  Mobility Specialist Start Time (ACUTE ONLY) 1001  Mobility Specialist Stop Time (ACUTE ONLY) 1013  Mobility Specialist Time Calculation (min) (ACUTE ONLY) 12 min   Pt sitting in recliner on RA requesting to return to bed. Pt STS and ambulates from recliner to bed CGA with no LOB. Pt left in bed with needs in reach and bed alarm activated.  Wash Hack  Mobility Specialist  04/10/24 10:46 AM

## 2024-04-10 NOTE — TOC Initial Note (Signed)
 Transition of Care (TOC) - Initial/Assessment Note    Patient Details  Name: Douglas Peterson. MRN: [id number] Date of Birth: [birth year]  Transition of Care [facility]) CM/SW Contact:    Alexandra Ice, RN Phone Number: 04/10/2024, 3:09 PM  Clinical Narrative:                  Patient admitted for alcohol withdrawal. Patient has listed a legal guardian with DSS, Walker Gulling. TOC will need to follow-up on Monday when DSS is open to confirm. Per documents legal guardian is for estate.         Patient Goals and CMS Choice            Expected Discharge Plan and Services                                              Prior Living Arrangements/Services                       Activities of Daily Living   ADL Screening (condition at time of admission) Independently performs ADLs?: Yes (appropriate for developmental age) Does the patient have a NEW difficulty with bathing/dressing/toileting/self-feeding that is expected to last >3 days?: No Does the patient have a NEW difficulty with getting in/out of bed, walking, or climbing stairs that is expected to last >3 days?: No Does the patient have a NEW difficulty with communication that is expected to last >3 days?: No Is the patient deaf or have difficulty hearing?: No Does the patient have difficulty seeing, even when wearing glasses/contacts?: No Does the patient have difficulty concentrating, remembering, or making decisions?: Yes  Permission Sought/Granted                  Emotional Assessment              Admission diagnosis:  Alcohol withdrawal (HCC) [F10.939] Alcohol withdrawal syndrome, with delirium (HCC) [F10.931] Patient Active Problem List   Diagnosis Date Noted   Head injury 03/25/2024   Alcohol withdrawal (HCC) 03/19/2024   Incarcerated hernia 01/27/2024   S/P laparoscopic hernia repair 01/27/2024   Intractable nausea and vomiting 10/06/2023   Protein-calorie malnutrition,  severe 08/20/2023   Ground-level fall 08/16/2023   GERD without esophagitis 08/06/2023   Nausea 07/19/2023   Pressure injury of skin 07/11/2023   Alcohol abuse 07/11/2023   Abdominal pain 06/16/2023   Elevated lactic acid level 06/16/2023   Chronic diastolic CHF (congestive heart failure) (HCC) 06/16/2023   Increased urinary frequency 06/08/2023   Diarrhea 06/08/2023   Upper respiratory infection 06/04/2023   Enteritis 06/02/2023   Polyuria 06/02/2023   Dyslipidemia 05/01/2023   Alcohol dependence (HCC) 05/01/2023   Maggot infestation feet 05/11/2022   Unable to care for self 05/11/2022   Debility 04/25/2022   Weakness 04/23/2022   Epistaxis 04/23/2022   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   Generalized weakness 03/18/2022   Nondisplaced fracture of shaft of left clavicle, initial encounter for closed fracture 03/18/2022   BPH (benign prostatic hyperplasia) 03/18/2022   Clavicle fracture 03/17/2022   Fall 03/17/2022   CAP (community acquired pneumonia) 03/13/2022   Altered mental status, unspecified 02/20/2022   Hypotension 02/19/2022   Dysphonia 08/13/2021   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   Paresthesia of right upper extremity 08/13/2021   SIRS (systemic inflammatory response syndrome) (HCC) 08/05/2020  GERD (gastroesophageal reflux disease) 06/05/2020   Alcohol use disorder, severe, dependence (HCC) 05/28/2020   Elevated LFTs 04/06/2020   Paroxysmal A-fib (HCC) 03/17/2020   Primary insomnia 02/06/2020   Left inguinal hernia 01/31/2020   Encounter for competency evaluation    Depression 01/17/2020   Pancytopenia (HCC)    Alcoholic cirrhosis of liver without ascites (HCC)    ETOH abuse    Alcohol intoxication (HCC)    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) 09/27/2019   Hypokalemia 09/27/2019   Dysphagia 09/27/2019   Alcohol withdrawal delirium, acute, hyperactive (HCC) 09/21/2019   Pressure injury of ankle, stage 1 08/15/2019    Palliative care by specialist    Closed fracture of right proximal humerus 03/19/2019   Vitamin D  deficiency 01/11/2019   Osteoporosis 12/22/2018   Closed nondisplaced fracture of acromial process of right scapula with routine healing 11/15/2018   Compression fracture of L2 vertebra with routine healing 11/15/2018   Delirium tremens (HCC) 03/08/2018   Essential hypertension 09/16/2017   Encephalopathy, 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   Steatohepatitis 12/03/2016   Abnormal liver enzymes 06/17/2016   Hereditary hemochromatosis (HCC) 06/17/2016   Anxiety 07/16/2015   Hyponatremia 07/09/2015   H/O traumatic brain injury 05/22/2014   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HCC) 05/22/2014   PCP:  Pcp, No Pharmacy:   [facility] - [place], [place] - [address] [place] [address] Phone: [phone] Fax: [phone]     Social Drivers of Health (SDOH) Social History: SDOH Screenings   Food Insecurity: No Food Insecurity (04/09/2024)  Housing: Low Risk  (04/09/2024)  Recent Concern: Housing - High Risk (01/27/2024)  Transportation Needs: Unmet Transportation Needs (04/09/2024)  Utilities: Not At Risk (04/09/2024)  Financial Resource Strain: Unknown (06/13/2019)  Physical Activity: Unknown (06/13/2019)  Social Connections: Socially Isolated (04/09/2024)  Stress: Unknown (06/13/2019)  Tobacco Use: Medium Risk (04/08/2024)   SDOH Interventions:     Readmission Risk Interventions    01/07/2024   11:31 AM 06/17/2023    4:23 PM 04/29/2022   10:26 AM  Readmission Risk Prevention Plan  Transportation Screening Complete Complete Complete  HRI or Home Care Consult  Complete Complete  Social Work Consult for Recovery Care Planning/Counseling  Complete Complete  Palliative Care Screening  Not Applicable Not Applicable  Medication Review Oceanographer) Complete Complete Complete  PCP or Specialist appointment within 3-5 days of discharge Complete    HRI or Home Care Consult Complete     SW Recovery Care/Counseling Consult Complete    Palliative Care Screening Not Applicable    Skilled Nursing Facility Not Applicable

## 2024-04-10 NOTE — Plan of Care (Signed)

## 2024-04-10 NOTE — Progress Notes (Signed)
 PROGRESS NOTE    Douglas Peterson.  ZOX:[id number] DOB: [birth year] DOA: 04/09/2024 PCP: Pcp, No    Hospital course:  73 year old man with history of A-fib, HTN, anxiety and alcohol use was admitted for inpatient alcohol detox after presenting with acute alcohol intoxication.  Patient noted he would like to stop drinking permanently.  Subjective:  Patient feels okay.  Notes he had sharp left lower quadrant pain earlier today which seems to have improved with passing gas.  He is status post appendectomy when he was 73 years old.  Otherwise denies feeling shaky or than usual.  Denies any hallucinations.  Exam:  General: Thin man, mildly tremulous sitting up in bed in NAD Eyes: conjuctiva mild injection bilaterally CVS: Tachy, S1-S2, regular  Respiratory: CTA GI: NABS, scaphoid, soft, no tenderness to light or deep palpation in abdomen, McBurney's point is negative, no rebound tenderness, no guarding. LE: Decreased muscle mass Neuro: A/O x 3,  grossly nonfocal.   Assessment & Plan:   Ongoing EtOH use and dependence Admitted for elective inpatient alcohol detox Admission alcohol was 214 Received phenobarbital  130 mg twice daily x 1 day on day of admission Presently on CIWA protocol with reasonable control of alcohol withdrawal symptoms  Hypokalemia Will replete and recheck Magnesium  within normal limits  HTN Will restart Atenolol  25 per outpatient regimen Continue to hold amlodipine  for now  PAF Continue atenolol  for rate control [name] not on any anticoagulation as an outpatient  Alcoholic cirrhosis Modestly elevated LFTs Cirrhosis is compensated, no evidence of ascites LFTs have remained modestly elevated and stable Can consider workup for hep C if elevated LFTs do not resolve with cessation of alcohol   DVT prophylaxis: Lovenox  Code Status: Full Family Communication: None today Disposition: Home                 Diet Orders (From admission,  onward)     Start     Ordered   04/09/24 1447  Diet Heart Room service appropriate? Yes; Fluid consistency: Thin  Diet effective now       Question Answer Comment  Room service appropriate? Yes   Fluid consistency: Thin      04/09/24 1448            Objective: Vitals:   04/10/24 0426 04/10/24 0800 04/10/24 0833 04/10/24 0915  BP: (!) 158/64  (!) 145/83   Pulse: 63 60 64 62  Resp:   18   Temp: 97.8 F (36.6 C)  98.1 F (36.7 C)   TempSrc: Oral     SpO2: 97%  97%   Weight:      Height:        Intake/Output Summary (Last 24 hours) at 04/10/2024 1407 Last data filed at 04/09/2024 2000 Gross per 24 hour  Intake 237 ml  Output 350 ml  Net -113 ml   Filed Weights   04/09/24 1620  Weight: 52.6 kg    Scheduled Meds:  atenolol   25 mg Oral Daily   baclofen   5 mg Oral TID   enoxaparin  (LOVENOX ) injection  40 mg Subcutaneous Q24H   LORazepam   0-4 mg Intravenous Q6H   Or   LORazepam   0-4 mg Oral Q6H   multivitamin with minerals  1 tablet Oral Daily   pantoprazole   40 mg Oral Daily   sucralfate   1 g Oral TID WC & HS   thiamine   100 mg Oral Daily   Or   thiamine   100 mg Intravenous Daily  Continuous Infusions:  Nutritional status     Body mass index is 19.91 kg/m.  Data Reviewed:   CBC: Recent Labs  Lab 04/08/24 0851 04/09/24 1007 04/10/24 0513  WBC 9.4 5.9 4.4  HGB 16.3 14.6 13.5  HCT 47.4 43.0 39.7  MCV 90.6 91.7 90.8  PLT 208 153 102*   Basic Metabolic Panel: Recent Labs  Lab 04/08/24 0851 04/09/24 1007 04/10/24 0513  NA 136 135 137  K 3.7 3.4* 3.4*  CL 95* 99 105  CO2 29 27 26   GLUCOSE 106* 94 94  BUN <5* <5* 10  CREATININE 0.59* 0.65 0.59*  CALCIUM  8.7* 8.3* 8.8*  MG  --  2.0  --    GFR: Estimated Creatinine Clearance: 62.1 mL/min (A) (by C-G formula based on SCr of 0.59 mg/dL (L)). Liver Function Tests: Recent Labs  Lab 04/08/24 0851 04/09/24 1007 04/10/24 0513  AST 105* 76* 71*  ALT 77* 58* 47*  ALKPHOS 136* 113 102   BILITOT 0.8 1.1 1.7*  PROT 8.2* 7.1 6.5  ALBUMIN 4.3 3.8 3.5   Recent Labs  Lab 04/08/24 0851  LIPASE 45   No results for input(s): "AMMONIA" in the last 168 hours. Coagulation Profile: No results for input(s): "INR", "PROTIME" in the last 168 hours. Cardiac Enzymes: Recent Labs  Lab 04/08/24 1016  CKTOTAL 138   BNP (last 3 results) No results for input(s): "PROBNP" in the last 8760 hours. HbA1C: No results for input(s): "HGBA1C" in the last 72 hours. CBG: No results for input(s): "GLUCAP" in the last 168 hours. Lipid Profile: No results for input(s): "CHOL", "HDL", "LDLCALC", "TRIG", "CHOLHDL", "LDLDIRECT" in the last 72 hours. Thyroid  Function Tests: No results for input(s): "TSH", "T4TOTAL", "FREET4", "T3FREE", "THYROIDAB" in the last 72 hours. Anemia Panel: No results for input(s): "VITAMINB12", "FOLATE", "FERRITIN", "TIBC", "IRON", "RETICCTPCT" in the last 72 hours. Sepsis Labs: No results for input(s): "PROCALCITON", "LATICACIDVEN" in the last 168 hours.  No results found for this or any previous visit (from the past 240 hours).       Radiology Studies: No results found.         LOS: 1 day   Time spent= 35 mins    Magdalene School, MD [facility]  If 7PM-7AM, please contact night-coverage  04/10/2024, 2:07 PM

## 2024-04-10 NOTE — Plan of Care (Signed)
  Problem: Education: Goal: Knowledge of General Education information will improve Description: Including pain rating scale, medication(s)/side effects and non-pharmacologic comfort measures Outcome: Progressing   Problem: Health Behavior/Discharge Planning: Goal: Ability to manage health-related needs will improve Outcome: Progressing   Problem: Clinical Measurements: Goal: Ability to maintain clinical measurements within normal limits will improve Outcome: Progressing Goal: Will remain free from infection Outcome: Progressing Goal: Diagnostic test results will improve Outcome: Progressing Goal: Respiratory complications will improve Outcome: Progressing Goal: Cardiovascular complication will be avoided Outcome: Progressing   Problem: Activity: Goal: Risk for activity intolerance will decrease Outcome: Progressing   Problem: Coping: Goal: Level of anxiety will decrease Outcome: Progressing   Problem: Elimination: Goal: Will not experience complications related to bowel motility Outcome: Progressing Goal: Will not experience complications related to urinary retention Outcome: Progressing   Problem: Pain Managment: Goal: General experience of comfort will improve and/or be controlled Outcome: Progressing   Problem: Safety: Goal: Ability to remain free from injury will improve Outcome: Progressing

## 2024-04-11 DIAGNOSIS — F10931 Alcohol use, unspecified with withdrawal delirium: Secondary | ICD-10-CM | POA: Diagnosis not present

## 2024-04-11 LAB — COMPREHENSIVE METABOLIC PANEL WITH GFR
ALT: 41 U/L (ref 0–44)
AST: 53 U/L — ABNORMAL HIGH (ref 15–41)
Albumin: 3.4 g/dL — ABNORMAL LOW (ref 3.5–5.0)
Alkaline Phosphatase: 97 U/L (ref 38–126)
Anion gap: 7 (ref 5–15)
BUN: 11 mg/dL (ref 8–23)
CO2: 27 mmol/L (ref 22–32)
Calcium: 8.7 mg/dL — ABNORMAL LOW (ref 8.9–10.3)
Chloride: 103 mmol/L (ref 98–111)
Creatinine, Ser: 0.61 mg/dL (ref 0.61–1.24)
GFR, Estimated: >60 mL/min (ref >60)
Glucose, Bld: 90 mg/dL (ref 70–99)
Potassium: 3.8 mmol/L (ref 3.5–5.1)
Sodium: 137 mmol/L (ref 135–145)
Total Bilirubin: 1.3 mg/dL — ABNORMAL HIGH (ref 0.0–1.2)
Total Protein: 6.3 g/dL — ABNORMAL LOW (ref 6.5–8.1)

## 2024-04-11 LAB — MAGNESIUM: Magnesium: 2 mg/dL (ref 1.7–2.4)

## 2024-04-11 LAB — VITAMIN D 25 HYDROXY (VIT D DEFICIENCY, FRACTURES): Vit D, 25-Hydroxy: 21.99 ng/mL — ABNORMAL LOW (ref 30–100)

## 2024-04-11 LAB — PHOSPHORUS: Phosphorus: 3.2 mg/dL (ref 2.5–4.6)

## 2024-04-11 MED ORDER — HYDRALAZINE HCL 20 MG/ML IJ SOLN
10.0000 mg | Freq: Four times a day (QID) | INTRAMUSCULAR | Status: DC | PRN
  Administered 2024-04-11: 10 mg via INTRAVENOUS
  Filled 2024-04-11: qty 1

## 2024-04-11 MED ORDER — HYDRALAZINE HCL 50 MG PO TABS: 50.0000 mg | ORAL_TABLET | Freq: Four times a day (QID) | ORAL | Status: DC | PRN

## 2024-04-11 MED ORDER — ATORVASTATIN CALCIUM 20 MG PO TABS
10.0000 mg | ORAL_TABLET | Freq: Every day | ORAL | Status: DC
  Administered 2024-04-11 – 2024-04-15 (×5): 10 mg via ORAL
  Filled 2024-04-11: qty 1
  Filled 2024-04-11: qty 0.5
  Filled 2024-04-11 (×4): qty 1

## 2024-04-11 MED ORDER — AMLODIPINE BESYLATE 5 MG PO TABS
5.0000 mg | ORAL_TABLET | Freq: Every day | ORAL | Status: DC
  Administered 2024-04-11 – 2024-04-15 (×5): 5 mg via ORAL
  Filled 2024-04-11 (×5): qty 1

## 2024-04-11 NOTE — TOC Initial Note (Signed)
 Transition of Care (TOC) - Initial/Assessment Note    Patient Details  Name: Douglas Peterson. MRN: [id number] Date of Birth: 1951-01-24  Transition of Care [facility]) CM/SW Contact:    Crayton Docker, RN 04/11/2024, 5:15 PM  Clinical Narrative:                  CM to patient's room regarding TOC screening assessment. CM introduced case management role and discharge care planning process. Patient amenable to screening interview. Patient states lives alone. Per patient has cane and walker. Per patient has previous home health and SNF experience.    Barriers to Discharge: Continued Medical Work up   Patient Goals and CMS Choice    Home/self care  Expected Discharge Plan and Services    Home/self care   Living arrangements for the past 2 months: Single Family Home      Prior Living Arrangements/Services Living arrangements for the past 2 months: Single Family Home Lives with:: Self     Current home services: DME (cane, walker)    Activities of Daily Living   ADL Screening (condition at time of admission) Independently performs ADLs?: Yes (appropriate for developmental age) Does the patient have a NEW difficulty with bathing/dressing/toileting/self-feeding that is expected to last >3 days?: No Does the patient have a NEW difficulty with getting in/out of bed, walking, or climbing stairs that is expected to last >3 days?: No Does the patient have a NEW difficulty with communication that is expected to last >3 days?: No Is the patient deaf or have difficulty hearing?: No Does the patient have difficulty seeing, even when wearing glasses/contacts?: No Does the patient have difficulty concentrating, remembering, or making decisions?: Yes  Permission Sought/Granted                  Emotional Assessment              Admission diagnosis:  Alcohol withdrawal (HCC) [F10.939] Alcohol withdrawal syndrome, with delirium (HCC) [F10.931] Patient Active Problem List    Diagnosis Date Noted   Head injury 03/25/2024   Alcohol withdrawal (HCC) 03/19/2024   Incarcerated hernia 01/27/2024   S/P laparoscopic hernia repair 01/27/2024   Intractable nausea and vomiting 10/06/2023   Protein-calorie malnutrition, severe 08/20/2023   Ground-level fall 08/16/2023   GERD without esophagitis 08/06/2023   Nausea 07/19/2023   Pressure injury of skin 07/11/2023   Alcohol abuse 07/11/2023   Abdominal pain 06/16/2023   Elevated lactic acid level 06/16/2023   Chronic diastolic CHF (congestive heart failure) (HCC) 06/16/2023   Increased urinary frequency 06/08/2023   Diarrhea 06/08/2023   Upper respiratory infection 06/04/2023   Enteritis 06/02/2023   Polyuria 06/02/2023   Dyslipidemia 05/01/2023   Alcohol dependence (HCC) 05/01/2023   Maggot infestation feet 05/11/2022   Unable to care for self 05/11/2022   Debility 04/25/2022   Weakness 04/23/2022   Epistaxis 04/23/2022   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   Generalized weakness 03/18/2022   Nondisplaced fracture of shaft of left clavicle, initial encounter for closed fracture 03/18/2022   BPH (benign prostatic hyperplasia) 03/18/2022   Clavicle fracture 03/17/2022   Fall 03/17/2022   CAP (community acquired pneumonia) 03/13/2022   Altered mental status, unspecified 02/20/2022   Hypotension 02/19/2022   Dysphonia 08/13/2021   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   Paresthesia of right upper extremity 08/13/2021   SIRS (systemic inflammatory response syndrome) (HCC) 08/05/2020   GERD (gastroesophageal reflux disease) 06/05/2020   Alcohol use disorder, severe, dependence (HCC)  05/28/2020   Elevated LFTs 04/06/2020   Paroxysmal A-fib (HCC) 03/17/2020   Primary insomnia 02/06/2020   Left inguinal hernia 01/31/2020   Encounter for competency evaluation    Depression 01/17/2020   Pancytopenia (HCC)    Alcoholic cirrhosis of liver without ascites (HCC)    ETOH abuse    Alcohol intoxication (HCC)     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) 09/27/2019   Hypokalemia 09/27/2019   Dysphagia 09/27/2019   Alcohol withdrawal delirium, acute, hyperactive (HCC) 09/21/2019   Pressure injury of ankle, stage 1 08/15/2019   Palliative care by specialist    Closed fracture of right proximal humerus 03/19/2019   Vitamin D  deficiency 01/11/2019   Osteoporosis 12/22/2018   Closed nondisplaced fracture of acromial process of right scapula with routine healing 11/15/2018   Compression fracture of L2 vertebra with routine healing 11/15/2018   Delirium tremens (HCC) 03/08/2018   Essential hypertension 09/16/2017   Encephalopathy, 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   Steatohepatitis 12/03/2016   Abnormal liver enzymes 06/17/2016   Hereditary hemochromatosis (HCC) 06/17/2016   Anxiety 07/16/2015   Hyponatremia 07/09/2015   H/O traumatic brain injury 05/22/2014   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HCC) 05/22/2014   PCP:  Pcp, No Pharmacy:   [facility] - [place], [place] - [address] [place] [address] Phone: [phone] Fax: [phone]     Social Drivers of Health (SDOH) Social History: SDOH Screenings   Food Insecurity: No Food Insecurity (04/09/2024)  Housing: Low Risk  (04/09/2024)  Recent Concern: Housing - High Risk (01/27/2024)  Transportation Needs: Unmet Transportation Needs (04/09/2024)  Utilities: Not At Risk (04/09/2024)  Financial Resource Strain: Unknown (06/13/2019)  Physical Activity: Unknown (06/13/2019)  Social Connections: Socially Isolated (04/09/2024)  Stress: Unknown (06/13/2019)  Tobacco Use: Medium Risk (04/08/2024)   SDOH Interventions:     Readmission Risk Interventions    04/11/2024    5:11 PM 01/07/2024   11:31 AM 06/17/2023    4:23 PM  Readmission Risk Prevention Plan  Transportation Screening -- Complete Complete  HRI or Home Care Consult   Complete  Social Work Consult for Recovery Care Planning/Counseling   Complete  Palliative Care  Screening   Not Applicable  Medication Review Oceanographer) -- Complete Complete  PCP or Specialist appointment within 3-5 days of discharge  Complete   HRI or Home Care Consult Patient refused Complete   SW Recovery Care/Counseling Consult Patient refused Complete   Palliative Care Screening Patient Refused Not Applicable   Skilled Nursing Facility -- Not Applicable

## 2024-04-11 NOTE — Care Management Important Message (Signed)
 Important Message  Patient Details  Name: Douglas Peterson. MRN: [id number] Date of Birth: 05/11/1951   Important Message Given:  Yes - Medicare IM     Anise Kerns 04/11/2024, 2:50 PM

## 2024-04-11 NOTE — Progress Notes (Signed)
 PROGRESS NOTE    [name].  XLK:[id number] DOB: [birth year] DOA: 04/09/2024 PCP: Pcp, No    Hospital course:  73 year old man with history of A-fib, HTN, anxiety and alcohol use was admitted for inpatient alcohol detox after presenting with acute alcohol intoxication.  Patient noted he would like to stop drinking permanently.  Subjective:  No significant events overnight, patient was sitting comfortably on the recliner and eating food.  Patient stated that he is feeling weak and cannot walk and have off balance.  He needs rehab.  Patient denied any other complaints.  Exam:  General: NAD, sitting comfortably in the recliner  Eyes: PERRLA  CVS: Tachy, S1-S2, regular  Respiratory: CTA GI: NABS, scaphoid, soft, no tenderness to light or deep palpation in abdomen, McBurney's point is negative, no rebound tenderness, no guarding. LE: Decreased muscle mass Neuro: AAO x 3, prior right arm contracture and right-sided weakness, no acute deficits.  Assessment & Plan:   Ongoing EtOH use and dependence Admitted for elective inpatient alcohol detox Admission alcohol was 214 Received phenobarbital  130 mg twice daily x 1 day on day of admission Presently on CIWA protocol with reasonable control of alcohol withdrawal symptoms  Hypokalemia Will replete and recheck Magnesium  within normal limits  HTN Continue amlodipine  5 mg p.o. daily and atenolol  25 mg p.o. daily Use hydralazine  IV versus p.o. depending on blood pressure Monitor BP and titrate medications accordingly  PAF Continue atenolol  for rate control [name] not on any anticoagulation as an outpatient  Alcoholic cirrhosis Modestly elevated LFTs Cirrhosis is compensated, no evidence of ascites LFTs have remained modestly elevated and stable Can consider workup for hep C if elevated LFTs do not resolve with cessation of alcohol   DVT prophylaxis: Lovenox  Code Status: Full Family Communication: None  today Disposition: TBD may need SNF pending PT/OT eval    Diet Orders (From admission, onward)     Start     Ordered   04/09/24 1447  Diet Heart Room service appropriate? Yes; Fluid consistency: Thin  Diet effective now       Question Answer Comment  Room service appropriate? Yes   Fluid consistency: Thin      04/09/24 1448            Objective: Vitals:   04/10/24 1800 04/10/24 2000 04/11/24 0638 04/11/24 1551  BP: (!) 140/80 (!) 149/80 (!) 169/82 (!) 176/85  Pulse:  60 68 (!) 56  Resp:  16  18  Temp:  97.7 F (36.5 C) 98.2 F (36.8 C) 97.8 F (36.6 C)  TempSrc:  Oral Oral Oral  SpO2:  99% 95% 99%  Weight:      Height:        Intake/Output Summary (Last 24 hours) at 04/11/2024 1708 Last data filed at 04/11/2024 1500 Gross per 24 hour  Intake 240 ml  Output 125 ml  Net 115 ml   Filed Weights   04/09/24 1620  Weight: 52.6 kg    Scheduled Meds:  amLODipine   5 mg Oral Daily   atenolol   25 mg Oral Daily   atorvastatin   10 mg Oral Daily   baclofen   5 mg Oral TID   enoxaparin  (LOVENOX ) injection  40 mg Subcutaneous Q24H   multivitamin with minerals  1 tablet Oral Daily   pantoprazole   40 mg Oral Daily   sucralfate   1 g Oral TID WC & HS   thiamine   100 mg Oral Daily   Or   thiamine   100 mg  Intravenous Daily   Continuous Infusions:  Nutritional status     Body mass index is 19.91 kg/m.  Data Reviewed:   CBC: Recent Labs  Lab 04/08/24 0851 04/09/24 1007 04/10/24 0513  WBC 9.4 5.9 4.4  HGB 16.3 14.6 13.5  HCT 47.4 43.0 39.7  MCV 90.6 91.7 90.8  PLT 208 153 102*   Basic Metabolic Panel: Recent Labs  Lab 04/08/24 0851 04/09/24 1007 04/10/24 0513 04/11/24 0254  NA 136 135 137 137  K 3.7 3.4* 3.4* 3.8  CL 95* 99 105 103  CO2 29 27 26 27   GLUCOSE 106* 94 94 90  BUN <5* <5* 10 11  CREATININE 0.59* 0.65 0.59* 0.61  CALCIUM  8.7* 8.3* 8.8* 8.7*  MG  --  2.0  --  2.0  PHOS  --   --   --  3.2   GFR: Estimated Creatinine Clearance: 62.1  mL/min (by C-G formula based on SCr of 0.61 mg/dL). Liver Function Tests: Recent Labs  Lab 04/08/24 0851 04/09/24 1007 04/10/24 0513 04/11/24 0254  AST 105* 76* 71* 53*  ALT 77* 58* 47* 41  ALKPHOS 136* 113 102 97  BILITOT 0.8 1.1 1.7* 1.3*  PROT 8.2* 7.1 6.5 6.3*  ALBUMIN 4.3 3.8 3.5 3.4*   Recent Labs  Lab 04/08/24 0851  LIPASE 45   No results for input(s): "AMMONIA" in the last 168 hours. Coagulation Profile: No results for input(s): "INR", "PROTIME" in the last 168 hours. Cardiac Enzymes: Recent Labs  Lab 04/08/24 1016  CKTOTAL 138   BNP (last 3 results) No results for input(s): "PROBNP" in the last 8760 hours. HbA1C: No results for input(s): "HGBA1C" in the last 72 hours. CBG: No results for input(s): "GLUCAP" in the last 168 hours. Lipid Profile: No results for input(s): "CHOL", "HDL", "LDLCALC", "TRIG", "CHOLHDL", "LDLDIRECT" in the last 72 hours. Thyroid  Function Tests: No results for input(s): "TSH", "T4TOTAL", "FREET4", "T3FREE", "THYROIDAB" in the last 72 hours. Anemia Panel: No results for input(s): "VITAMINB12", "FOLATE", "FERRITIN", "TIBC", "IRON", "RETICCTPCT" in the last 72 hours. Sepsis Labs: No results for input(s): "PROCALCITON", "LATICACIDVEN" in the last 168 hours.  No results found for this or any previous visit (from the past 240 hours).       Radiology Studies: No results found.      LOS: 2 days   Time spent= 35 mins    Althia Atlas, MD [facility]  If 7PM-7AM, please contact night-coverage  04/11/2024, 5:08 PM

## 2024-04-11 NOTE — Plan of Care (Signed)
 Tamea Faith, LPN

## 2024-04-11 NOTE — Plan of Care (Signed)

## 2024-04-12 DIAGNOSIS — F10931 Alcohol use, unspecified with withdrawal delirium: Secondary | ICD-10-CM | POA: Diagnosis not present

## 2024-04-12 LAB — CBC
HCT: 42.5 % (ref 39.0–52.0)
Hemoglobin: 14.6 g/dL (ref 13.0–17.0)
MCH: 31.3 pg (ref 26.0–34.0)
MCHC: 34.4 g/dL (ref 30.0–36.0)
MCV: 91 fL (ref 80.0–100.0)
Platelets: 97 10*3/uL — ABNORMAL LOW (ref 150–400)
RBC: 4.67 MIL/uL (ref 4.22–5.81)
RDW: 12.6 % (ref 11.5–15.5)
WBC: 4.2 10*3/uL (ref 4.0–10.5)
nRBC: 0 % (ref 0.0–0.2)

## 2024-04-12 LAB — BASIC METABOLIC PANEL WITH GFR
Anion gap: 8 (ref 5–15)
BUN: 10 mg/dL (ref 8–23)
CO2: 27 mmol/L (ref 22–32)
Calcium: 9 mg/dL (ref 8.9–10.3)
Chloride: 102 mmol/L (ref 98–111)
Creatinine, Ser: 0.46 mg/dL — ABNORMAL LOW (ref 0.61–1.24)
GFR, Estimated: >60 mL/min (ref >60)
Glucose, Bld: 96 mg/dL (ref 70–99)
Potassium: 3.6 mmol/L (ref 3.5–5.1)
Sodium: 137 mmol/L (ref 135–145)

## 2024-04-12 LAB — MAGNESIUM: Magnesium: 2 mg/dL (ref 1.7–2.4)

## 2024-04-12 LAB — PHOSPHORUS: Phosphorus: 3.4 mg/dL (ref 2.5–4.6)

## 2024-04-12 MED ORDER — PROCHLORPERAZINE MALEATE 5 MG PO TABS
5.0000 mg | ORAL_TABLET | Freq: Once | ORAL | Status: AC
  Administered 2024-04-12: 5 mg via ORAL
  Filled 2024-04-12 (×2): qty 1

## 2024-04-12 MED ORDER — VITAMIN D (ERGOCALCIFEROL) 1.25 MG (50000 UNIT) PO CAPS
50000.0000 [IU] | ORAL_CAPSULE | ORAL | Status: DC
  Administered 2024-04-12: 50000 [IU] via ORAL
  Filled 2024-04-12 (×2): qty 1

## 2024-04-12 MED ORDER — VENLAFAXINE HCL 25 MG PO TABS
25.0000 mg | ORAL_TABLET | Freq: Two times a day (BID) | ORAL | Status: DC
  Administered 2024-04-12 – 2024-04-15 (×6): 25 mg via ORAL
  Filled 2024-04-12 (×7): qty 1

## 2024-04-12 MED ORDER — HYDROXYZINE HCL 50 MG PO TABS: 25.0000 mg | ORAL_TABLET | Freq: Three times a day (TID) | ORAL | Status: DC

## 2024-04-12 MED ORDER — POTASSIUM CHLORIDE CRYS ER 20 MEQ PO TBCR
40.0000 meq | EXTENDED_RELEASE_TABLET | Freq: Once | ORAL | Status: AC
  Administered 2024-04-12: 40 meq via ORAL
  Filled 2024-04-12: qty 2

## 2024-04-12 NOTE — Progress Notes (Signed)
 PROGRESS NOTE    [name].  [id number]:[id number] DOB: 1951/10/12 DOA: 04/09/2024 PCP: Pcp, No    Hospital course:  73 year old man with history of A-fib, HTN, anxiety and alcohol use was admitted for inpatient alcohol detox after presenting with acute alcohol intoxication.  Patient noted he would like to stop drinking permanently.  Subjective:  No significant events overnight, patient was sitting comfortably on the recliner, still feeling some shaking and withdrawal symptoms.  Denies any pain.  Patient did walk with PT, ambulation is improving.   Exam:  General: NAD, sitting comfortably in the recliner  Eyes: PERRLA  CVS: Tachy, S1-S2, regular  Respiratory: CTA GI: NABS, scaphoid, soft, no tenderness to light or deep palpation in abdomen, McBurney's point is negative, no rebound tenderness, no guarding. LE: Decreased muscle mass Neuro: AAO x 3, prior right arm contracture and right-sided weakness, no acute deficits.  Assessment & Plan:   Ongoing EtOH use and dependence Admitted for elective inpatient alcohol detox Admission alcohol was 214 Received phenobarbital  130 mg twice daily x 1 day on day of admission Presently on CIWA protocol with reasonable control of alcohol withdrawal symptoms  Hypokalemia Will replete and recheck Magnesium  within normal limits  HTN Continue amlodipine  5 mg p.o. daily and atenolol  25 mg p.o. daily Use hydralazine  IV versus p.o. depending on blood pressure Monitor BP and titrate medications accordingly  PAF Continue atenolol  for rate control [name] not on any anticoagulation as an outpatient  Alcoholic cirrhosis Modestly elevated LFTs Cirrhosis is compensated, no evidence of ascites LFTs have remained modestly elevated and stable Can consider workup for hep C if elevated LFTs do not resolve with cessation of alcohol  Vitamin D  insufficiency: started vitamin D  50,000 units p.o. weekly, follow with PCP to repeat vitamin D  level after  3 to 6 months.   DVT prophylaxis: Lovenox  Code Status: Full Family Communication: None today Disposition: SNF pas per PT/OT eval, follow TOC for placement    Diet Orders (From admission, onward)     Start     Ordered   04/09/24 1447  Diet Heart Room service appropriate? Yes; Fluid consistency: Thin  Diet effective now       Question Answer Comment  Room service appropriate? Yes   Fluid consistency: Thin      04/09/24 1448            Objective: Vitals:   04/11/24 1933 04/11/24 2213 04/12/24 0445 04/12/24 0745  BP: (!) 190/75 (!) 118/57 130/71 (!) 141/82  Pulse: 64 66 (!) 56 (!) 56  Resp:    18  Temp:  (!) 97.1 F (36.2 C) (!) 97.5 F (36.4 C) 97.6 F (36.4 C)  TempSrc: Axillary   Oral  SpO2: 98% 97% 99% 95%  Weight:      Height:        Intake/Output Summary (Last 24 hours) at 04/12/2024 1654 Last data filed at 04/12/2024 0803 Gross per 24 hour  Intake --  Output 600 ml  Net -600 ml   Filed Weights   04/09/24 1620  Weight: 52.6 kg    Scheduled Meds:  amLODipine   5 mg Oral Daily   atenolol   25 mg Oral Daily   atorvastatin   10 mg Oral Daily   baclofen   5 mg Oral TID   enoxaparin  (LOVENOX ) injection  40 mg Subcutaneous Q24H   multivitamin with minerals  1 tablet Oral Daily   pantoprazole   40 mg Oral Daily   sucralfate   1 g Oral TID  WC & HS   thiamine   100 mg Oral Daily   Or   thiamine   100 mg Intravenous Daily   venlafaxine  25 mg Oral BID WC   Vitamin D  (Ergocalciferol )  50,000 Units Oral Q7 days   Continuous Infusions:  Nutritional status     Body mass index is 19.91 kg/m.  Data Reviewed:   CBC: Recent Labs  Lab 04/08/24 0851 04/09/24 1007 04/10/24 0513 04/12/24 0547  WBC 9.4 5.9 4.4 4.2  HGB 16.3 14.6 13.5 14.6  HCT 47.4 43.0 39.7 42.5  MCV 90.6 91.7 90.8 91.0  PLT 208 153 102* 97*   Basic Metabolic Panel: Recent Labs  Lab 04/08/24 0851 04/09/24 1007 04/10/24 0513 04/11/24 0254 04/12/24 0547  NA 136 135 137 137 137  K  3.7 3.4* 3.4* 3.8 3.6  CL 95* 99 105 103 102  CO2 29 27 26 27 27   GLUCOSE 106* 94 94 90 96  BUN <5* <5* 10 11 10   CREATININE 0.59* 0.65 0.59* 0.61 0.46*  CALCIUM  8.7* 8.3* 8.8* 8.7* 9.0  MG  --  2.0  --  2.0 2.0  PHOS  --   --   --  3.2 3.4   GFR: Estimated Creatinine Clearance: 62.1 mL/min (A) (by C-G formula based on SCr of 0.46 mg/dL (L)). Liver Function Tests: Recent Labs  Lab 04/08/24 0851 04/09/24 1007 04/10/24 0513 04/11/24 0254  AST 105* 76* 71* 53*  ALT 77* 58* 47* 41  ALKPHOS 136* 113 102 97  BILITOT 0.8 1.1 1.7* 1.3*  PROT 8.2* 7.1 6.5 6.3*  ALBUMIN 4.3 3.8 3.5 3.4*   Recent Labs  Lab 04/08/24 0851  LIPASE 45   No results for input(s): "AMMONIA" in the last 168 hours. Coagulation Profile: No results for input(s): "INR", "PROTIME" in the last 168 hours. Cardiac Enzymes: Recent Labs  Lab 04/08/24 1016  CKTOTAL 138   BNP (last 3 results) No results for input(s): "PROBNP" in the last 8760 hours. HbA1C: No results for input(s): "HGBA1C" in the last 72 hours. CBG: No results for input(s): "GLUCAP" in the last 168 hours. Lipid Profile: No results for input(s): "CHOL", "HDL", "LDLCALC", "TRIG", "CHOLHDL", "LDLDIRECT" in the last 72 hours. Thyroid  Function Tests: No results for input(s): "TSH", "T4TOTAL", "FREET4", "T3FREE", "THYROIDAB" in the last 72 hours. Anemia Panel: No results for input(s): "VITAMINB12", "FOLATE", "FERRITIN", "TIBC", "IRON", "RETICCTPCT" in the last 72 hours. Sepsis Labs: No results for input(s): "PROCALCITON", "LATICACIDVEN" in the last 168 hours.  No results found for this or any previous visit (from the past 240 hours).       Radiology Studies: No results found.      LOS: 3 days   Time spent= 35 mins    Althia Atlas, MD [facility]  If 7PM-7AM, please contact night-coverage  04/12/2024, 4:54 PM

## 2024-04-12 NOTE — Plan of Care (Signed)
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
## 2024-04-12 NOTE — TOC Progression Note (Addendum)
 Transition of Care (TOC) - Progression Note    Patient Details  Name: Douglas Peterson. MRN: [id number] Date of Birth: 03-01-1951  Transition of Care [facility]) CM/SW Contact  Crayton Docker, RN 04/12/2024, 9:19 AM  Clinical Narrative:     CM consult noted regarding confirmation on whether patient has a DSS legal guardian. CM call to [facility], phone: [phone]. CM spoke to [place]. Per Brian Campanile, will send a message to supervisor to call CM back. CM provided contact number for callback.  CM returned call to [facility] DSS regarding legal guardianship status. Per Aniya, patient was released from guardianship status in 2024.    Barriers to Discharge: Continued Medical Work up  Expected Discharge Plan and Services    Home   Living arrangements for the past 2 months: Single Family Home                   Social Determinants of Health (SDOH) Interventions SDOH Screenings   Food Insecurity: No Food Insecurity (04/09/2024)  Housing: Low Risk  (04/09/2024)  Recent Concern: Housing - High Risk (01/27/2024)  Transportation Needs: Unmet Transportation Needs (04/09/2024)  Utilities: Not At Risk (04/09/2024)  Financial Resource Strain: Unknown (06/13/2019)  Physical Activity: Unknown (06/13/2019)  Social Connections: Socially Isolated (04/09/2024)  Stress: Unknown (06/13/2019)  Tobacco Use: Medium Risk (04/08/2024)    Readmission Risk Interventions    04/11/2024    5:11 PM 01/07/2024   11:31 AM 06/17/2023    4:23 PM  Readmission Risk Prevention Plan  Transportation Screening -- Complete Complete  HRI or Home Care Consult   Complete  Social Work Consult for Recovery Care Planning/Counseling   Complete  Palliative Care Screening   Not Applicable  Medication Review Oceanographer) -- Complete Complete  PCP or Specialist appointment within 3-5 days of discharge  Complete   HRI or Home Care Consult Patient refused Complete   SW Recovery Care/Counseling Consult Patient refused Complete    Palliative Care Screening Patient Refused Not Applicable   Skilled Nursing Facility -- Not Applicable

## 2024-04-13 DIAGNOSIS — F10931 Alcohol use, unspecified with withdrawal delirium: Secondary | ICD-10-CM | POA: Diagnosis not present

## 2024-04-13 LAB — BASIC METABOLIC PANEL WITH GFR
Anion gap: 7 (ref 5–15)
BUN: 16 mg/dL (ref 8–23)
CO2: 24 mmol/L (ref 22–32)
Calcium: 8.9 mg/dL (ref 8.9–10.3)
Chloride: 105 mmol/L (ref 98–111)
Creatinine, Ser: 0.59 mg/dL — ABNORMAL LOW (ref 0.61–1.24)
GFR, Estimated: >60 mL/min (ref >60)
Glucose, Bld: 94 mg/dL (ref 70–99)
Potassium: 4 mmol/L (ref 3.5–5.1)
Sodium: 136 mmol/L (ref 135–145)

## 2024-04-13 LAB — HEPATIC FUNCTION PANEL
ALT: 42 U/L (ref 0–44)
AST: 48 U/L — ABNORMAL HIGH (ref 15–41)
Albumin: 3.5 g/dL (ref 3.5–5.0)
Alkaline Phosphatase: 95 U/L (ref 38–126)
Bilirubin, Direct: 0.2 mg/dL (ref 0.0–0.2)
Indirect Bilirubin: 0.7 mg/dL (ref 0.3–0.9)
Total Bilirubin: 0.9 mg/dL (ref 0.0–1.2)
Total Protein: 6.6 g/dL (ref 6.5–8.1)

## 2024-04-13 LAB — CBC
HCT: 42.9 % (ref 39.0–52.0)
Hemoglobin: 14.5 g/dL (ref 13.0–17.0)
MCH: 31.3 pg (ref 26.0–34.0)
MCHC: 33.8 g/dL (ref 30.0–36.0)
MCV: 92.5 fL (ref 80.0–100.0)
Platelets: 105 10*3/uL — ABNORMAL LOW (ref 150–400)
RBC: 4.64 MIL/uL (ref 4.22–5.81)
RDW: 12.8 % (ref 11.5–15.5)
WBC: 5.4 10*3/uL (ref 4.0–10.5)
nRBC: 0 % (ref 0.0–0.2)

## 2024-04-13 LAB — PHOSPHORUS: Phosphorus: 3.6 mg/dL (ref 2.5–4.6)

## 2024-04-13 LAB — MAGNESIUM: Magnesium: 2.1 mg/dL (ref 1.7–2.4)

## 2024-04-13 MED ORDER — BISACODYL 10 MG RE SUPP: 10.0000 mg | Freq: Every day | RECTAL | Status: DC | PRN

## 2024-04-13 MED ORDER — BISACODYL 5 MG PO TBEC: 10.0000 mg | DELAYED_RELEASE_TABLET | Freq: Every day | ORAL | Status: DC

## 2024-04-13 MED ORDER — POLYETHYLENE GLYCOL 3350 17 G PO PACK
17.0000 g | PACK | Freq: Two times a day (BID) | ORAL | Status: DC
  Administered 2024-04-13 – 2024-04-14 (×2): 17 g via ORAL
  Filled 2024-04-13 (×2): qty 1

## 2024-04-13 MED ORDER — BISACODYL 5 MG PO TBEC
10.0000 mg | DELAYED_RELEASE_TABLET | Freq: Once | ORAL | Status: AC
  Administered 2024-04-13: 10 mg via ORAL
  Filled 2024-04-13: qty 2

## 2024-04-13 NOTE — Plan of Care (Signed)
   Problem: Education: Goal: Knowledge of General Education information will improve Description: Including pain rating scale, medication(s)/side effects and non-pharmacologic comfort measures Outcome: Progressing   Problem: Clinical Measurements: Goal: Ability to maintain clinical measurements within normal limits will improve Outcome: Progressing Goal: Will remain free from infection Outcome: Progressing Goal: Diagnostic test results will improve Outcome: Progressing Goal: Respiratory complications will improve Outcome: Progressing Goal: Cardiovascular complication will be avoided Outcome: Progressing   Problem: Activity: Goal: Risk for activity intolerance will decrease Outcome: Progressing   Problem: Nutrition: Goal: Adequate nutrition will be maintained Outcome: Progressing   Problem: Coping: Goal: Level of anxiety will decrease Outcome: Progressing   Problem: Elimination: Goal: Will not experience complications related to bowel motility Outcome: Progressing Goal: Will not experience complications related to urinary retention Outcome: Progressing   Problem: Pain Managment: Goal: General experience of comfort will improve and/or be controlled Outcome: Progressing   Problem: Safety: Goal: Ability to remain free from injury will improve Outcome: Progressing   Problem: Skin Integrity: Goal: Risk for impaired skin integrity will decrease Outcome: Progressing

## 2024-04-13 NOTE — Progress Notes (Signed)
 PROGRESS NOTE    Douglas Peterson.  ZOX:[id number] DOB: Sep 26, 1951 DOA: 04/09/2024 PCP: Pcp, No    Hospital course:  73 year old man with history of A-fib, HTN, anxiety and alcohol use was admitted for inpatient alcohol detox after presenting with acute alcohol intoxication.  Patient noted he would like to stop drinking permanently.  Subjective:  No significant events overnight, patient was sitting comfortably on the recliner, stated that he is constipated, tried to use the bathroom but did not had a bowel movement.  Requesting for some laxatives.  No any other complaints.   Exam:  General: NAD, sitting comfortably in the recliner  Eyes: PERRLA  CVS: Tachy, S1-S2, regular  Respiratory: CTA GI: NABS, scaphoid, soft, no tenderness to light or deep palpation in abdomen, McBurney's point is negative, no rebound tenderness, no guarding. LE: Decreased muscle mass Neuro: AAO x 3, prior right arm contracture and right-sided weakness, no acute deficits.  Assessment & Plan:   Ongoing EtOH use and dependence Admitted for elective inpatient alcohol detox Admission alcohol was 214 Received phenobarbital  130 mg twice daily x 1 day on day of admission Presently on CIWA protocol with reasonable control of alcohol withdrawal symptoms  Hypokalemia Will replete and recheck Magnesium  within normal limits  HTN Continue amlodipine  5 mg p.o. daily and atenolol  25 mg p.o. daily Use hydralazine  IV versus p.o. depending on blood pressure Monitor BP and titrate medications accordingly  PAF Continue atenolol  for rate control [name] not on any anticoagulation as an outpatient  Alcoholic cirrhosis Modestly elevated LFTs Cirrhosis is compensated, no evidence of ascites LFTs have remained modestly elevated and stable Can consider workup for hep C if elevated LFTs do not resolve with cessation of alcohol  Vitamin D  insufficiency: started vitamin D  50,000 units p.o. weekly, follow with PCP to  repeat vitamin D  level after 3 to 6 months.  Constipation, started laxatives.  DVT prophylaxis: Lovenox  Code Status: Full Family Communication: None today Disposition: SNF as per PT/OT eval, follow TOC for placement    Diet Orders (From admission, onward)     Start     Ordered   04/09/24 1447  Diet Heart Room service appropriate? Yes; Fluid consistency: Thin  Diet effective now       Question Answer Comment  Room service appropriate? Yes   Fluid consistency: Thin      04/09/24 1448            Objective: Vitals:   04/12/24 1659 04/12/24 2000 04/13/24 0410 04/13/24 0758  BP: 127/71 112/62 126/78 (!) 141/80  Pulse: 64 69 61 64  Resp: 18 17  16   Temp: 97.9 F (36.6 C) 97.8 F (36.6 C) 98.1 F (36.7 C) 98.2 F (36.8 C)  TempSrc:    Oral  SpO2: 97% 95% 96% 97%  Weight:      Height:        Intake/Output Summary (Last 24 hours) at 04/13/2024 1128 Last data filed at 04/13/2024 1046 Gross per 24 hour  Intake 240 ml  Output 275 ml  Net -35 ml   Filed Weights   04/09/24 1620  Weight: 52.6 kg    Scheduled Meds:  amLODipine   5 mg Oral Daily   atenolol   25 mg Oral Daily   atorvastatin   10 mg Oral Daily   baclofen   5 mg Oral TID   bisacodyl   10 mg Oral QHS   bisacodyl   10 mg Oral Once   enoxaparin  (LOVENOX ) injection  40 mg Subcutaneous Q24H  multivitamin with minerals  1 tablet Oral Daily   pantoprazole   40 mg Oral Daily   polyethylene glycol  17 g Oral BID   sucralfate   1 g Oral TID WC & HS   thiamine   100 mg Oral Daily   Or   thiamine   100 mg Intravenous Daily   venlafaxine  25 mg Oral BID WC   Vitamin D  (Ergocalciferol )  50,000 Units Oral Q7 days   Continuous Infusions:  Nutritional status     Body mass index is 19.91 kg/m.  Data Reviewed:   CBC: Recent Labs  Lab 04/08/24 0851 04/09/24 1007 04/10/24 0513 04/12/24 0547 04/13/24 0447  WBC 9.4 5.9 4.4 4.2 5.4  HGB 16.3 14.6 13.5 14.6 14.5  HCT 47.4 43.0 39.7 42.5 42.9  MCV 90.6 91.7 90.8  91.0 92.5  PLT 208 153 102* 97* 105*   Basic Metabolic Panel: Recent Labs  Lab 04/09/24 1007 04/10/24 0513 04/11/24 0254 04/12/24 0547 04/13/24 0447  NA 135 137 137 137 136  K 3.4* 3.4* 3.8 3.6 4.0  CL 99 105 103 102 105  CO2 27 26 27 27 24   GLUCOSE 94 94 90 96 94  BUN <5* 10 11 10 16   CREATININE 0.65 0.59* 0.61 0.46* 0.59*  CALCIUM  8.3* 8.8* 8.7* 9.0 8.9  MG 2.0  --  2.0 2.0 2.1  PHOS  --   --  3.2 3.4 3.6   GFR: Estimated Creatinine Clearance: 62.1 mL/min (A) (by C-G formula based on SCr of 0.59 mg/dL (L)). Liver Function Tests: Recent Labs  Lab 04/08/24 0851 04/09/24 1007 04/10/24 0513 04/11/24 0254 04/13/24 0447  AST 105* 76* 71* 53* 48*  ALT 77* 58* 47* 41 42  ALKPHOS 136* 113 102 97 95  BILITOT 0.8 1.1 1.7* 1.3* 0.9  PROT 8.2* 7.1 6.5 6.3* 6.6  ALBUMIN 4.3 3.8 3.5 3.4* 3.5   Recent Labs  Lab 04/08/24 0851  LIPASE 45   No results for input(s): AMMONIA in the last 168 hours. Coagulation Profile: No results for input(s): INR, PROTIME in the last 168 hours. Cardiac Enzymes: Recent Labs  Lab 04/08/24 1016  CKTOTAL 138   BNP (last 3 results) No results for input(s): PROBNP in the last 8760 hours. HbA1C: No results for input(s): HGBA1C in the last 72 hours. CBG: No results for input(s): GLUCAP in the last 168 hours. Lipid Profile: No results for input(s): CHOL, HDL, LDLCALC, TRIG, CHOLHDL, LDLDIRECT in the last 72 hours. Thyroid  Function Tests: No results for input(s): TSH, T4TOTAL, FREET4, T3FREE, THYROIDAB in the last 72 hours. Anemia Panel: No results for input(s): VITAMINB12, FOLATE, FERRITIN, TIBC, IRON, RETICCTPCT in the last 72 hours. Sepsis Labs: No results for input(s): PROCALCITON, LATICACIDVEN in the last 168 hours.  No results found for this or any previous visit (from the past 240 hours).       Radiology Studies: No results found.      LOS: 4 days   Time spent= 35  mins    Althia Atlas, MD [facility]  If 7PM-7AM, please contact night-coverage  04/13/2024, 11:28 AM

## 2024-04-14 ENCOUNTER — Encounter: Payer: Self-pay | Admitting: Internal Medicine

## 2024-04-14 DIAGNOSIS — F10931 Alcohol use, unspecified with withdrawal delirium: Secondary | ICD-10-CM | POA: Diagnosis not present

## 2024-04-14 LAB — HEPATIC FUNCTION PANEL
ALT: 39 U/L (ref 0–44)
AST: 40 U/L (ref 15–41)
Albumin: 3.5 g/dL (ref 3.5–5.0)
Alkaline Phosphatase: 85 U/L (ref 38–126)
Bilirubin, Direct: 0.2 mg/dL (ref 0.0–0.2)
Indirect Bilirubin: 0.9 mg/dL (ref 0.3–0.9)
Total Bilirubin: 1.1 mg/dL (ref 0.0–1.2)
Total Protein: 6.4 g/dL — ABNORMAL LOW (ref 6.5–8.1)

## 2024-04-14 LAB — CBC
HCT: 41.3 % (ref 39.0–52.0)
Hemoglobin: 13.8 g/dL (ref 13.0–17.0)
MCH: 30.7 pg (ref 26.0–34.0)
MCHC: 33.4 g/dL (ref 30.0–36.0)
MCV: 91.8 fL (ref 80.0–100.0)
Platelets: 101 10*3/uL — ABNORMAL LOW (ref 150–400)
RBC: 4.5 MIL/uL (ref 4.22–5.81)
RDW: 12.4 % (ref 11.5–15.5)
WBC: 6.4 10*3/uL (ref 4.0–10.5)
nRBC: 0 % (ref 0.0–0.2)

## 2024-04-14 LAB — BASIC METABOLIC PANEL WITH GFR
Anion gap: 9 (ref 5–15)
BUN: 13 mg/dL (ref 8–23)
CO2: 25 mmol/L (ref 22–32)
Calcium: 8.6 mg/dL — ABNORMAL LOW (ref 8.9–10.3)
Chloride: 99 mmol/L (ref 98–111)
Creatinine, Ser: 0.53 mg/dL — ABNORMAL LOW (ref 0.61–1.24)
GFR, Estimated: >60 mL/min (ref >60)
Glucose, Bld: 87 mg/dL (ref 70–99)
Potassium: 3.7 mmol/L (ref 3.5–5.1)
Sodium: 133 mmol/L — ABNORMAL LOW (ref 135–145)

## 2024-04-14 LAB — PHOSPHORUS: Phosphorus: 4 mg/dL (ref 2.5–4.6)

## 2024-04-14 LAB — MAGNESIUM: Magnesium: 1.9 mg/dL (ref 1.7–2.4)

## 2024-04-14 MED ORDER — LACTULOSE 10 GM/15ML PO SOLN
10.0000 g | Freq: Every day | ORAL | Status: DC
  Administered 2024-04-15: 10 g via ORAL
  Filled 2024-04-14: qty 30

## 2024-04-14 MED ORDER — BISACODYL 10 MG RE SUPP: 10.0000 mg | Freq: Once | RECTAL | Status: DC

## 2024-04-14 MED ORDER — LACTULOSE 10 GM/15ML PO SOLN
30.0000 g | Freq: Three times a day (TID) | ORAL | Status: DC
  Administered 2024-04-14: 30 g via ORAL
  Filled 2024-04-14: qty 60

## 2024-04-14 NOTE — Progress Notes (Signed)
 Physical Therapy Treatment Patient Details Name: Douglas Peterson. MRN: [id number] DOB: [birth year] Today's Date: 04/14/2024   History of Present Illness Douglas Peterson. is a 73 y.o. male with medical history significant of atrial fibrillation, hypertension, anxiety, hyperlipidemia, GERD, constipation, and alcohol dependence. After presenting to this ED on 04/08/2024 with acute alcohol intoxication, the patient has again presented here for intoxication, but wishing for detox and sobriety.    PT Comments  Per his usual pt pleasant and eager to do what ever I can to get stronger.  He was able to circumambulate the nurses' station with slow but relatively safe and confident ambulation with SPC.  Additionally he showed good confidence in managing stair negotiation with single L rail use.  Pt did have fatigue with HR up to nearly 120, but showed good motivation to push himself.  Pt overall doing well, still not at his baseline and will benefit from continued PT per POC.      If plan is discharge home, recommend the following: A little help with bathing/dressing/bathroom;Assistance with cooking/housework;A little help with walking and/or transfers;Assist for transportation   Can travel by private vehicle     Yes  Equipment Recommendations  None recommended by PT    Recommendations for Other Services       Precautions / Restrictions Precautions Precautions: Fall Recall of Precautions/Restrictions: Intact Restrictions Weight Bearing Restrictions Per Provider Order: No     Mobility  Bed Mobility               General bed mobility comments: NT pt in recliner pre/post session    Transfers Overall transfer level: Modified independent Equipment used: Straight cane Transfers: Sit to/from Stand Sit to Stand: Supervision           General transfer comment: Extra time required to complete indep with forward rocking momentum    Ambulation/Gait Ambulation/Gait assistance:  Supervision Gait Distance (Feet): 250 Feet Assistive device: Straight cane Gait Pattern/deviations: Decreased step length - right, Decreased step length - left, Decreased dorsiflexion - left, Decreased dorsiflexion - right, Decreased stride length, Step-to pattern       General Gait Details: supervision, occasional R foot scuffing with no LOBs.  PT with some fatigue with the effort with HR up to 110s, though O2 remains in the high 90s on room air.  Minimal cuing for cadence/foot clearance and AD positioning   Stairs Stairs: Yes Stairs assistance: Contact guard assist Stair Management: One rail Left Number of Stairs: 6 General stair comments: Pt was able to ascend steps with reciprocal pattern, descends with step-to strategy.  No LOBs or need for phyiscal assist   Wheelchair Mobility     Tilt Bed    Modified Rankin (Stroke Patients Only)       Balance     Sitting balance-Leahy Scale: Good       Standing balance-Leahy Scale: Fair Standing balance comment: mild imbalance with ambulation and fatigue                            Communication Communication Communication: No apparent difficulties Factors Affecting Communication: Reduced clarity of speech  Cognition Arousal: Alert Behavior During Therapy: WFL for tasks assessed/performed   PT - Cognitive impairments: No apparent impairments                         Following commands: Intact      Cueing Cueing Techniques: Verbal cues  Exercises      General Comments        Pertinent Vitals/Pain Pain Assessment Pain Assessment: No/denies pain    Home Living                          Prior Function            PT Goals (current goals can now be found in the care plan section) Progress towards PT goals: Progressing toward goals    Frequency    Min 2X/week      PT Plan      Co-evaluation              AM-PAC PT 6 Clicks Mobility   Outcome Measure  Help  needed turning from your back to your side while in a flat bed without using bedrails?: None Help needed moving from lying on your back to sitting on the side of a flat bed without using bedrails?: A Little Help needed moving to and from a bed to a chair (including a wheelchair)?: None Help needed standing up from a chair using your arms (e.g., wheelchair or bedside chair)?: None Help needed to walk in hospital room?: A Little Help needed climbing 3-5 steps with a railing? : A Little 6 Click Score: 21    End of Session Equipment Utilized During Treatment: Gait belt Activity Tolerance: Patient tolerated treatment well Patient left: with chair alarm set;with call bell/phone within reach Nurse Communication: Mobility status PT Visit Diagnosis: Unsteadiness on feet (R26.81);Other abnormalities of gait and mobility (R26.89);Difficulty in walking, not elsewhere classified (R26.2);Muscle weakness (generalized) (M62.81)     Time: 5284-1324 PT Time Calculation (min) (ACUTE ONLY): 18 min  Charges:    $Therapeutic Activity: 8-22 mins PT General Charges $$ ACUTE PT VISIT: 1 Visit                     Darice Edelman, DPT 04/14/2024, 5:03 PM

## 2024-04-14 NOTE — Progress Notes (Signed)
 Mobility Specialist - Progress Note   04/14/24 1000  Mobility  Activity Ambulated with assistance in hallway  Level of Assistance Contact guard assist, steadying assist  Assistive Device Cane  Distance Ambulated (ft) 360 ft  Activity Response Tolerated well  Mobility visit 1 Mobility  Mobility Specialist Start Time (ACUTE ONLY) 0950  Mobility Specialist Stop Time (ACUTE ONLY) 1012  Mobility Specialist Time Calculation (min) (ACUTE ONLY) 22 min     Pt sitting in recliner upon arrival, utilizing RA. Pt motivated for activity. STS minG and ambulation in hallway with CGA-minG. 1 mild LOB self-corrected. No complaints. Pt returned to chair with alarm set, needs in reach.    Searcy Czech Mobility Specialist 04/14/24, 10:19 AM

## 2024-04-14 NOTE — Progress Notes (Signed)
 PROGRESS NOTE    [name].  ZOX:[id number] DOB: May 22, 1951 DOA: 04/09/2024 PCP: Pcp, No    Hospital course:  74 year old man with history of A-fib, HTN, anxiety and alcohol use was admitted for inpatient alcohol detox after presenting with acute alcohol intoxication.  Patient noted he would like to stop drinking permanently.  Subjective:  No significant events overnight, patient stated that he still constipated, did not move bowels and he does not feel good. Patient agreed for Dulcolax suppository followed by enema. Patient agreed for SNF placement, prefers to go to [facility]. Informed to [facility]   Exam:  General: NAD, sitting comfortably in the recliner  Eyes: PERRLA  CVS: Tachy, S1-S2, regular  Respiratory: Equal air entry bilaterally, no crackles or wheezes GI: BS present, soft, no tenderness  LE: No edema  Neuro: AAO x 3, residual right arm contracture and right-sided weakness, no acute deficits.  Assessment & Plan:   Ongoing EtOH use and dependence Admitted for elective inpatient alcohol detox Admission alcohol was 214 Received phenobarbital  130 mg twice daily x 1 day on day of admission Presently on CIWA protocol with reasonable control of alcohol withdrawal symptoms  Hypokalemia Will replete and recheck Magnesium  within normal limits  HTN Continue amlodipine  5 mg p.o. daily and atenolol  25 mg p.o. daily Use hydralazine  IV versus p.o. depending on blood pressure Monitor BP and titrate medications accordingly  PAF Continue atenolol  for rate control [name] not on any anticoagulation as an outpatient  Alcoholic cirrhosis Modestly elevated LFTs Cirrhosis is compensated, no evidence of ascites LFTs have remained modestly elevated and stable Can consider workup for hep C if elevated LFTs do not resolve with cessation of alcohol  Vitamin D  insufficiency: started vitamin D  50,000 units p.o. weekly, follow with PCP to repeat vitamin D  level after 3 to  6 months.  Constipation, started laxatives. Use laxative suppository as needed  DVT prophylaxis: Lovenox  Code Status: Full Family Communication: None today Disposition: SNF as per PT/OT eval, follow TOC for placement    Diet Orders (From admission, onward)     Start     Ordered   04/09/24 1447  Diet Heart Room service appropriate? Yes; Fluid consistency: Thin  Diet effective now       Question Answer Comment  Room service appropriate? Yes   Fluid consistency: Thin      04/09/24 1448            Objective: Vitals:   04/13/24 1613 04/13/24 1945 04/14/24 0423 04/14/24 0802  BP: 132/76 121/75 (!) 142/73 121/84  Pulse: 72 63 (!) 56 (!) 59  Resp: 18   15  Temp: 98 F (36.7 C) 98.1 F (36.7 C) 97.7 F (36.5 C) 97.9 F (36.6 C)  TempSrc: Oral     SpO2: 94% 96% 95% 97%  Weight:      Height:        Intake/Output Summary (Last 24 hours) at 04/14/2024 1514 Last data filed at 04/14/2024 1054 Gross per 24 hour  Intake 240 ml  Output 400 ml  Net -160 ml   Filed Weights   04/09/24 1620  Weight: 52.6 kg    Scheduled Meds:  amLODipine   5 mg Oral Daily   atenolol   25 mg Oral Daily   atorvastatin   10 mg Oral Daily   baclofen   5 mg Oral TID   bisacodyl   10 mg Oral QHS   bisacodyl   10 mg Rectal Once   enoxaparin  (LOVENOX ) injection  40 mg Subcutaneous  Q24H   lactulose   30 g Oral TID   multivitamin with minerals  1 tablet Oral Daily   pantoprazole   40 mg Oral Daily   sucralfate   1 g Oral TID WC & HS   thiamine   100 mg Oral Daily   Or   thiamine   100 mg Intravenous Daily   venlafaxine  25 mg Oral BID WC   Vitamin D  (Ergocalciferol )  50,000 Units Oral Q7 days   Continuous Infusions:  Nutritional status     Body mass index is 19.91 kg/m.  Data Reviewed:   CBC: Recent Labs  Lab 04/09/24 1007 04/10/24 0513 04/12/24 0547 04/13/24 0447 04/14/24 0520  WBC 5.9 4.4 4.2 5.4 6.4  HGB 14.6 13.5 14.6 14.5 13.8  HCT 43.0 39.7 42.5 42.9 41.3  MCV 91.7 90.8 91.0  92.5 91.8  PLT 153 102* 97* 105* 101*   Basic Metabolic Panel: Recent Labs  Lab 04/09/24 1007 04/10/24 0513 04/11/24 0254 04/12/24 0547 04/13/24 0447 04/14/24 0520  NA 135 137 137 137 136 133*  K 3.4* 3.4* 3.8 3.6 4.0 3.7  CL 99 105 103 102 105 99  CO2 27 26 27 27 24 25   GLUCOSE 94 94 90 96 94 87  BUN <5* 10 11 10 16 13   CREATININE 0.65 0.59* 0.61 0.46* 0.59* 0.53*  CALCIUM  8.3* 8.8* 8.7* 9.0 8.9 8.6*  MG 2.0  --  2.0 2.0 2.1 1.9  PHOS  --   --  3.2 3.4 3.6 4.0   GFR: Estimated Creatinine Clearance: 62.1 mL/min (A) (by C-G formula based on SCr of 0.53 mg/dL (L)). Liver Function Tests: Recent Labs  Lab 04/09/24 1007 04/10/24 0513 04/11/24 0254 04/13/24 0447 04/14/24 0520  AST 76* 71* 53* 48* 40  ALT 58* 47* 41 42 39  ALKPHOS 113 102 97 95 85  BILITOT 1.1 1.7* 1.3* 0.9 1.1  PROT 7.1 6.5 6.3* 6.6 6.4*  ALBUMIN 3.8 3.5 3.4* 3.5 3.5   Recent Labs  Lab 04/08/24 0851  LIPASE 45   No results for input(s): AMMONIA in the last 168 hours. Coagulation Profile: No results for input(s): INR, PROTIME in the last 168 hours. Cardiac Enzymes: Recent Labs  Lab 04/08/24 1016  CKTOTAL 138   BNP (last 3 results) No results for input(s): PROBNP in the last 8760 hours. HbA1C: No results for input(s): HGBA1C in the last 72 hours. CBG: No results for input(s): GLUCAP in the last 168 hours. Lipid Profile: No results for input(s): CHOL, HDL, LDLCALC, TRIG, CHOLHDL, LDLDIRECT in the last 72 hours. Thyroid  Function Tests: No results for input(s): TSH, T4TOTAL, FREET4, T3FREE, THYROIDAB in the last 72 hours. Anemia Panel: No results for input(s): VITAMINB12, FOLATE, FERRITIN, TIBC, IRON, RETICCTPCT in the last 72 hours. Sepsis Labs: No results for input(s): PROCALCITON, LATICACIDVEN in the last 168 hours.  No results found for this or any previous visit (from the past 240 hours).       Radiology Studies: No results  found.      LOS: 5 days   Time spent= 35 mins    Althia Atlas, MD [facility]  If 7PM-7AM, please contact night-coverage  04/14/2024, 3:14 PM

## 2024-04-14 NOTE — TOC Progression Note (Signed)
 Transition of Care (TOC) - Progression Note    Patient Details  Name: Douglas Peterson. MRN: [id number] Date of Birth: 09-03-1951  Transition of Care [facility]) CM/SW Contact  Zoe Hinds, RN Phone Number: 04/14/2024, 4:24 PM  Clinical Narrative:    This CM spoke with pt at bedside regarding dc plan. Pt expressed interest in [facility], this CM informed pt he did not meet criteria per PT and explained that if [facility] there will be a co pay requirement due to pt's prior stay from 03/12-05/01 pt verbalized understanding . Pt also informed that TOC is unable to coordinate [facility] services until pt established PCP care . Pt educated on the importance of establishing PCP care to have his orders managed at dc pt verbalized understanding . This CM rec'd message from [facility] updating  she coordinated with [facility] a PCP appt with Amber Scroggins appt is 6/19 at 1:00p for pt. TOC will cont to follow dc planning / care coordination and update as applicable.      Barriers to Discharge: Continued Medical Work up  Expected Discharge Plan and Services       Living arrangements for the past 2 months: Single Family Home                                       Social Determinants of Health (SDOH) Interventions SDOH Screenings   Food Insecurity: No Food Insecurity (04/09/2024)  Housing: Low Risk  (04/09/2024)  Recent Concern: Housing - High Risk (01/27/2024)  Transportation Needs: Unmet Transportation Needs (04/09/2024)  Utilities: Not At Risk (04/09/2024)  Financial Resource Strain: Unknown (06/13/2019)  Physical Activity: Unknown (06/13/2019)  Social Connections: Socially Isolated (04/09/2024)  Stress: Unknown (06/13/2019)  Tobacco Use: Medium Risk (04/14/2024)    Readmission Risk Interventions    04/11/2024    5:11 PM 01/07/2024   11:31 AM 06/17/2023    4:23 PM  Readmission Risk Prevention Plan  Transportation Screening -- Complete Complete  HRI or Home Care Consult    Complete  Social Work Consult for Recovery Care Planning/Counseling   Complete  Palliative Care Screening   Not Applicable  Medication Review Oceanographer) -- Complete Complete  PCP or Specialist appointment within 3-5 days of discharge  Complete   HRI or Home Care Consult Patient refused Complete   SW Recovery Care/Counseling Consult Patient refused Complete   Palliative Care Screening Patient Refused Not Applicable   Skilled Nursing Facility -- Not Applicable

## 2024-04-14 NOTE — Discharge Instructions (Addendum)
 Some PCP options in [place] area- not a comprehensive list  [facility]- [phone] [facility]- [phone] [facility]- [phone] [facility]- [phone] [facility]- [phone] [facility]- [phone]  or [facility] Physician Referral Line [phone]

## 2024-04-15 DIAGNOSIS — F1020 Alcohol dependence, uncomplicated: Secondary | ICD-10-CM | POA: Diagnosis not present

## 2024-04-15 DIAGNOSIS — K7030 Alcoholic cirrhosis of liver without ascites: Secondary | ICD-10-CM | POA: Diagnosis not present

## 2024-04-15 DIAGNOSIS — I480 Paroxysmal atrial fibrillation: Secondary | ICD-10-CM | POA: Diagnosis not present

## 2024-04-15 DIAGNOSIS — F419 Anxiety disorder, unspecified: Secondary | ICD-10-CM | POA: Diagnosis not present

## 2024-04-15 LAB — CBC
HCT: 39.7 % (ref 39.0–52.0)
Hemoglobin: 13.5 g/dL (ref 13.0–17.0)
MCH: 31.2 pg (ref 26.0–34.0)
MCHC: 34 g/dL (ref 30.0–36.0)
MCV: 91.7 fL (ref 80.0–100.0)
Platelets: 94 10*3/uL — ABNORMAL LOW (ref 150–400)
RBC: 4.33 MIL/uL (ref 4.22–5.81)
RDW: 12.4 % (ref 11.5–15.5)
WBC: 5.7 10*3/uL (ref 4.0–10.5)
nRBC: 0 % (ref 0.0–0.2)

## 2024-04-15 LAB — HEPATIC FUNCTION PANEL
ALT: 36 U/L (ref 0–44)
AST: 32 U/L (ref 15–41)
Albumin: 3.6 g/dL (ref 3.5–5.0)
Alkaline Phosphatase: 86 U/L (ref 38–126)
Bilirubin, Direct: 0.2 mg/dL (ref 0.0–0.2)
Indirect Bilirubin: 0.8 mg/dL (ref 0.3–0.9)
Total Bilirubin: 1 mg/dL (ref 0.0–1.2)
Total Protein: 6.6 g/dL (ref 6.5–8.1)

## 2024-04-15 LAB — BASIC METABOLIC PANEL WITH GFR
Anion gap: 8 (ref 5–15)
BUN: 12 mg/dL (ref 8–23)
CO2: 24 mmol/L (ref 22–32)
Calcium: 8.7 mg/dL — ABNORMAL LOW (ref 8.9–10.3)
Chloride: 100 mmol/L (ref 98–111)
Creatinine, Ser: 0.52 mg/dL — ABNORMAL LOW (ref 0.61–1.24)
GFR, Estimated: >60 mL/min (ref >60)
Glucose, Bld: 90 mg/dL (ref 70–99)
Potassium: 3.6 mmol/L (ref 3.5–5.1)
Sodium: 132 mmol/L — ABNORMAL LOW (ref 135–145)

## 2024-04-15 LAB — MAGNESIUM: Magnesium: 1.9 mg/dL (ref 1.7–2.4)

## 2024-04-15 LAB — PHOSPHORUS: Phosphorus: 4 mg/dL (ref 2.5–4.6)

## 2024-04-15 MED ORDER — VENLAFAXINE HCL 25 MG PO TABS: 25.0000 mg | ORAL_TABLET | Freq: Two times a day (BID) | ORAL | 0 refills | Status: AC

## 2024-04-15 NOTE — Discharge Summary (Signed)
 Physician Discharge Summary   Patient: Douglas Peterson. MRN: [id number] DOB: August 07, 1951  Admit date:     04/09/2024  Discharge date: 04/15/24  Discharge Physician: Roise Cleaver   PCP: Pcp, No   Recommendations at discharge:    Follow up with PCP for lab checks and medication monitoring See GI for cirrhosis surveillance   Discharge Diagnoses: Principal Problem:   Alcohol withdrawal (HCC) Active Problems:   Hypokalemia   Elevated LFTs   Alcohol use disorder, severe, dependence (HCC)   Essential hypertension   Paroxysmal A-fib (HCC)   Alcoholic cirrhosis of liver without ascites (HCC)   Anxiety  Resolved Problems:   * No resolved hospital problems. *  Hospital Course: Douglas Peterson. Is a 73 y.o. male with history of alcohol dependence, frequent admissions for alcohol withdrawal, A-fib, hypertension, anxiety, who presented to the hospital on this admission with acute alcohol intoxication and requested inpatient alcohol detox.  Patient completed withdrawal during this admission without complication.  Stay was prolonged pending outpatient care arrangements.  TOC was consulted.  Patient does have some care complexities given poor follow-up in the past.  Home health services were arranged prior to discharge.  The importance of close follow-up with primary care physician was reiterated to him.  Please see TOC notes for more detail.  Today at discharge patient reported feeling ready to discharge and back to his physiologic baseline.  Acute alcohol intoxication EtOH dependence Alcohol withdrawal - Admitted for elective inpatient alcohol detox - Admission alcohol 214 - Status post phenobarb 130 mg twice daily x 1 day on day of admission.  Completed as needed Ativan .  Last Ativan  given 6/10 - No evidence of withdrawal on day of discharge -have counseled on cessation   Hypokalemia - replaced.   HTN At goal, continue current medications  PAF Continue current medications.  Not on  anticoagulation outpatient.  High fall risk.   Alcoholic cirrhosis Modestly elevated LFTs Cirrhosis is compensated, no evidence of ascites LFTs have resulted normal Would benefit from outpatient follow-up with GI for surveillance   Vitamin D  insufficiency:  started vitamin D  50,000 units p.o. weekly, follow with PCP to repeat vitamin D  level after 3 to 6 months.   Constipation, resolved  Depression Anxiety - Venlafaxine  started on this admission.  Will prescribe at current dose at discharge  Poor medication adherence - Patient has been loss to follow-up with PCP on multiple occasions.  Frequent readmissions to ED.  TOC consulted to assist, home health arrangements have been made.  Patient will require follow-up with PCP to continue receiving home health services.  This was communicated to him.  Consultants: n/a Procedures performed: n/a  Disposition: Home health Diet recommendation:  Discharge Diet Orders (From admission, onward)     Start     Ordered   04/15/24 0000  Diet general        04/15/24 1259           Regular diet DISCHARGE MEDICATION: Allergies as of 04/15/2024       Reactions   Hydrochlorothiazide Other (See Comments)   Hyponatremia   Other    strawberries        Medication List     STOP taking these medications    acetaminophen  500 MG tablet Commonly known as: TYLENOL    chlordiazePOXIDE  25 MG capsule Commonly known as: LIBRIUM    oxyCODONE -acetaminophen  5-325 MG tablet Commonly known as: PERCOCET/ROXICET   polyethylene glycol 17 g packet Commonly known as: MIRALAX  / GLYCOLAX   TAKE these medications    amLODipine  5 MG tablet Commonly known as: NORVASC  Take 1 tablet (5 mg total) by mouth daily.   atenolol  25 MG tablet Commonly known as: TENORMIN  Take 1 tablet (25 mg total) by mouth daily.   atorvastatin  10 MG tablet Commonly known as: LIPITOR Take 1 tablet (10 mg total) by mouth daily.   Baclofen  5 MG Tabs Take 1 tablet  (5 mg total) by mouth 3 (three) times daily.   feeding supplement Liqd Take 237 mLs by mouth 2 (two) times daily between meals.   folic acid  1 MG tablet Commonly known as: FOLVITE  Take 1 tablet (1 mg total) by mouth daily.   melatonin 5 MG Tabs Take 1 tablet (5 mg total) by mouth at bedtime.   multivitamin with minerals Tabs tablet Take 1 tablet by mouth daily.   ondansetron  4 MG tablet Commonly known as: ZOFRAN  Take 1 tablet (4 mg total) by mouth every 6 (six) hours as needed for nausea.   pantoprazole  40 MG tablet Commonly known as: PROTONIX  Take 1 tablet (40 mg total) by mouth daily.   sucralfate  1 g tablet Commonly known as: CARAFATE  Take 1 tablet (1 g total) by mouth 4 (four) times daily -  with meals and at bedtime.   thiamine  100 MG tablet Commonly known as: VITAMIN B1 Take 1 tablet (100 mg total) by mouth daily.   venlafaxine  25 MG tablet Commonly known as: EFFEXOR  Take 1 tablet (25 mg total) by mouth 2 (two) times daily with a meal.        Follow-up Information     [facility]. Go on 04/21/2024.   Why: at 1:00 with Ennis Hart Scroggins be sure to bring [employer], ID and current med list Contact information: [address] [facility] [place]  [phone]               Discharge Exam: Cleavon Curls Weights   04/09/24 1620  Weight: 52.6 kg   Constitutional:  Normal appearance. Non toxic-appearing.  HENT: Head Normocephalic and atraumatic.  Mucous membranes are moist.  Eyes:  Extraocular intact. Conjunctivae normal. Cardiovascular: Rate and Rhythm: Normal rate and regular rhythm.  Pulmonary: Non labored, symmetric rise of chest wall.  Musculoskeletal:  Normal range of motion.  Skin: warm and dry. not jaundiced.  Neurological: No focal deficit present. alert. Oriented. Psychiatric: Mood and Affect congruent.    Condition at discharge: stable  The results of significant diagnostics from this hospitalization (including imaging,  microbiology, ancillary and laboratory) are listed below for reference.   Imaging Studies: DG Chest Port 1 View Result Date: 04/08/2024 CLINICAL DATA:  Shortness of breath. EXAM: PORTABLE CHEST 1 VIEW COMPARISON:  Mar 29, 2024. FINDINGS: The heart size and mediastinal contours are within normal limits. Both lungs are clear. Old bilateral rib fractures are noted. IMPRESSION: No active disease. Electronically Signed   By: Rosalene Colon M.D.   On: 04/08/2024 10:24   DG Chest Portable 1 View Result Date: 03/29/2024 CLINICAL DATA:  cough / SOB EXAM: PORTABLE CHEST 1 VIEW COMPARISON:  Chest x-ray 03/26/2024 FINDINGS: The heart and mediastinal contours are within normal limits. Low lung volumes with bibasilar atelectasis. No focal consolidation. No pulmonary edema. No pleural effusion. No pneumothorax. No acute osseous abnormality. Old healed mid left clavicular fracture. Old healed right distal clavicular fracture. Old healed right rib fractures. Old healed left rib fractures. IMPRESSION: Low lung volumes with no active disease. Consider repeat PA and lateral view of the chest  with improved inspiratory effort. Electronically Signed   By: Morgane  Naveau M.D.   On: 03/29/2024 01:01   DG Chest Port 1 View Result Date: 03/26/2024 CLINICAL DATA:  Shortness of breath and nonproductive cough. EXAM: PORTABLE CHEST 1 VIEW COMPARISON:  January 07, 2024 FINDINGS: The heart size and mediastinal contours are within normal limits. Markedly low lung volumes are noted which is likely secondary to the degree of patient inspiration. A trace amount of atelectasis is seen within the bilateral lung bases. No focal consolidation, pleural effusion or pneumothorax is identified. Chronic fracture deformities are seen involving the mid left clavicle and distal right clavicle. An additional chronic fracture of the proximal right humerus is noted. Multilevel degenerative changes seen throughout the thoracic spine. IMPRESSION: Very low lung  volumes with a trace amount of bibasilar atelectasis. Correlation with follow-up chest plain film, with improved patient inspiratory effort, is recommended if acute cardiopulmonary disease remains of clinical concern. Electronically Signed   By: Virgle Grime M.D.   On: 03/26/2024 19:46   CT HEAD WO CONTRAST ( ) Result Date: 03/19/2024 CLINICAL DATA:  Head trauma, minor (Age >= 65y) EXAM: CT HEAD WITHOUT CONTRAST TECHNIQUE: Contiguous axial images were obtained from the base of the skull through the vertex without intravenous contrast. RADIATION DOSE REDUCTION: This exam was performed according to the departmental dose-optimization program which includes automated exposure control, adjustment of the mA and/or kV according to patient size and/or use of iterative reconstruction technique. COMPARISON:  CT head January 07, 2024. FINDINGS: Brain: No evidence of acute infarction, hemorrhage, hydrocephalus, extra-axial collection or mass lesion/mass effect. Cerebral atrophy. Vascular: No hyperdense vessel. Skull: No acute fracture Sinuses/Orbits: Clear sinuses.  No acute orbital findings. IMPRESSION: No evidence of acute intracranial abnormality. Electronically Signed   By: Stevenson Elbe M.D.   On: 03/19/2024 03:29    Microbiology: Results for orders placed or performed during the hospital encounter of 10/06/23  Resp panel by RT-PCR (RSV, Flu A&B, Covid) Anterior Nasal Swab     Status: None   Collection Time: 10/06/23  2:10 PM   Specimen: Anterior Nasal Swab  Result Value Ref Range Status   SARS Coronavirus 2 by RT PCR NEGATIVE NEGATIVE Final    Comment: (NOTE) SARS-CoV-2 target nucleic acids are NOT DETECTED.  The SARS-CoV-2 RNA is generally detectable in upper respiratory specimens during the acute phase of infection. The lowest concentration of SARS-CoV-2 viral copies this assay can detect is 138 copies/mL. A negative result does not preclude SARS-Cov-2 infection and should not be used as the  sole basis for treatment or other patient management decisions. A negative result may occur with  improper specimen collection/handling, submission of specimen other than nasopharyngeal swab, presence of viral mutation(s) within the areas targeted by this assay, and inadequate number of viral copies(<138 copies/mL). A negative result must be combined with clinical observations, patient history, and epidemiological information. The expected result is Negative.  Fact Sheet for Patients:  BloggerCourse.com  Fact Sheet for Healthcare Providers:  SeriousBroker.it  This test is no t yet approved or cleared by the [place]  FDA and  has been authorized for detection and/or diagnosis of SARS-CoV-2 by FDA under an Emergency Use Authorization (EUA). This EUA will remain  in effect (meaning this test can be used) for the duration of the COVID-19 declaration under Section 564(b)(1) of the Act, 21 U.S.C.section 360bbb-3(b)(1), unless the authorization is terminated  or revoked sooner.       Influenza A by PCR NEGATIVE NEGATIVE Final  Influenza B by PCR NEGATIVE NEGATIVE Final    Comment: (NOTE) The Xpert Xpress SARS-CoV-2/FLU/RSV plus assay is intended as an aid in the diagnosis of influenza from Nasopharyngeal swab specimens and should not be used as a sole basis for treatment. Nasal washings and aspirates are unacceptable for Xpert Xpress SARS-CoV-2/FLU/RSV testing.  Fact Sheet for Patients: BloggerCourse.com  Fact Sheet for Healthcare Providers: SeriousBroker.it  This test is not yet approved or cleared by the [place]  FDA and has been authorized for detection and/or diagnosis of SARS-CoV-2 by FDA under an Emergency Use Authorization (EUA). This EUA will remain in effect (meaning this test can be used) for the duration of the COVID-19 declaration under Section 564(b)(1) of the  Act, 21 U.S.C. section 360bbb-3(b)(1), unless the authorization is terminated or revoked.     Resp Syncytial Virus by PCR NEGATIVE NEGATIVE Final    Comment: (NOTE) Fact Sheet for Patients: BloggerCourse.com  Fact Sheet for Healthcare Providers: SeriousBroker.it  This test is not yet approved or cleared by the [place]  FDA and has been authorized for detection and/or diagnosis of SARS-CoV-2 by FDA under an Emergency Use Authorization (EUA). This EUA will remain in effect (meaning this test can be used) for the duration of the COVID-19 declaration under Section 564(b)(1) of the Act, 21 U.S.C. section 360bbb-3(b)(1), unless the authorization is terminated or revoked.  Performed at [facility], [address] [place], [place] [address]     Labs: CBC: Recent Labs  Lab 04/10/24 [phone] 04/12/24 0547 04/13/24 0447 04/14/24 0520 04/15/24 0530  WBC 4.4 4.2 5.4 6.4 5.7  HGB 13.5 14.6 14.5 13.8 13.5  HCT 39.7 42.5 42.9 41.3 39.7  MCV 90.8 91.0 92.5 91.8 91.7  PLT 102* 97* 105* 101* 94*   Basic Metabolic Panel: Recent Labs  Lab 04/11/24 0254 04/12/24 0547 04/13/24 0447 04/14/24 0520 04/15/24 0530  NA 137 137 136 133* 132*  K 3.8 3.6 4.0 3.7 3.6  CL 103 102 105 99 100  CO2 27 27 24 25 24   GLUCOSE 90 96 94 87 90  BUN 11 10 16 13 12   CREATININE 0.61 0.46* 0.59* 0.53* 0.52*  CALCIUM  8.7* 9.0 8.9 8.6* 8.7*  MG 2.0 2.0 2.1 1.9 1.9  PHOS 3.2 3.4 3.6 4.0 4.0   Liver Function Tests: Recent Labs  Lab 04/10/24 0513 04/11/24 0254 04/13/24 0447 04/14/24 0520 04/15/24 0530  AST 71* 53* 48* 40 32  ALT 47* 41 42 39 36  ALKPHOS 102 97 95 85 86  BILITOT 1.7* 1.3* 0.9 1.1 1.0  PROT 6.5 6.3* 6.6 6.4* 6.6  ALBUMIN 3.5 3.4* 3.5 3.5 3.6   CBG: No results for input(s): GLUCAP in the last 168 hours.  Discharge time spent: 32 minutes.  Signed: Latrecia Capito, DO [facility] 04/15/2024

## 2024-04-15 NOTE — Progress Notes (Signed)
 Physical Therapy Treatment Patient Details Name: Douglas Peterson. MRN: [id number] DOB: Dec 02, 1950 Today's Date: 04/15/2024   History of Present Illness Douglas Peterson. is a 73 y.o. male with medical history significant of atrial fibrillation, hypertension, anxiety, hyperlipidemia, GERD, constipation, and alcohol dependence. After presenting to this ED on 04/08/2024 with acute alcohol intoxication, the patient has again presented here for intoxication, but wishing for detox and sobriety.    PT Comments  Pt is able to get to EOB and complete x 2 laps on unit with SPC and cga x 1.  Pt with occasional imbalances but is able to recover on his own without assist.  Balance deficits evident but at baseline.  Pt with some c/o R foot pain which he attributes to some of his imbalances.  Stated neighbor brought shoes in that he does not typically wear and feels it will be improved with his regular shoes.   If plan is discharge home, recommend the following: A little help with bathing/dressing/bathroom;Assistance with cooking/housework;A little help with walking and/or transfers;Assist for transportation   Can travel by private vehicle        Equipment Recommendations  None recommended by PT    Recommendations for Other Services       Precautions / Restrictions Precautions Precautions: Fall Recall of Precautions/Restrictions: Intact Restrictions Weight Bearing Restrictions Per Provider Order: No     Mobility  Bed Mobility Overal bed mobility: Modified Independent Bed Mobility: Supine to Sit     Supine to sit: Used rails, HOB elevated       Patient Response: Cooperative  Transfers Overall transfer level: Modified independent Equipment used: Straight cane Transfers: Sit to/from Stand Sit to Stand: Supervision, Contact guard assist                Ambulation/Gait Ambulation/Gait assistance: Supervision, Contact guard assist Gait Distance (Feet): 400 Feet Assistive device:  Straight cane Gait Pattern/deviations: Decreased step length - right, Decreased step length - left, Decreased dorsiflexion - left, Decreased dorsiflexion - right, Decreased stride length, Step-to pattern Gait velocity: Decreased     General Gait Details: occasional LOB's today recovered without assist.  attributes it to sore R foot from shoes he does not typically wear.   Stairs             Wheelchair Mobility     Tilt Bed Tilt Bed Patient Response: Cooperative  Modified Rankin (Stroke Patients Only)       Balance Overall balance assessment: Needs assistance Sitting-balance support: Feet supported Sitting balance-Leahy Scale: Good       Standing balance-Leahy Scale: Fair Standing balance comment: mild imbalance with ambulation and fatigue                            Communication Communication Communication: No apparent difficulties Factors Affecting Communication: Reduced clarity of speech  Cognition Arousal: Alert Behavior During Therapy: WFL for tasks assessed/performed   PT - Cognitive impairments: No apparent impairments                         Following commands: Intact      Cueing Cueing Techniques: Verbal cues  Exercises      General Comments        Pertinent Vitals/Pain Pain Assessment Pain Assessment: Faces Faces Pain Scale: Hurts little more Pain Location: R foot - stated he is not wearing his normal shoes.  friend brought older ones that he doesn't  wear anymore. Pain Descriptors / Indicators: Sore Pain Intervention(s): Limited activity within patient's tolerance, Monitored during session    Home Living                          Prior Function            PT Goals (current goals can now be found in the care plan section) Progress towards PT goals: Progressing toward goals    Frequency    Min 2X/week      PT Plan      Co-evaluation              AM-PAC PT 6 Clicks Mobility   Outcome  Measure  Help needed turning from your back to your side while in a flat bed without using bedrails?: None Help needed moving from lying on your back to sitting on the side of a flat bed without using bedrails?: A Little Help needed moving to and from a bed to a chair (including a wheelchair)?: None Help needed standing up from a chair using your arms (e.g., wheelchair or bedside chair)?: None Help needed to walk in hospital room?: A Little Help needed climbing 3-5 steps with a railing? : A Little 6 Click Score: 21    End of Session Equipment Utilized During Treatment: Gait belt Activity Tolerance: Patient tolerated treatment well Patient left: with chair alarm set;with call bell/phone within reach Nurse Communication: Mobility status PT Visit Diagnosis: Unsteadiness on feet (R26.81);Other abnormalities of gait and mobility (R26.89);Difficulty in walking, not elsewhere classified (R26.2);Muscle weakness (generalized) (M62.81)     Time: 8841-6606 PT Time Calculation (min) (ACUTE ONLY): 16 min  Charges:    $Gait Training: 8-22 mins PT General Charges $$ ACUTE PT VISIT: 1 Visit                   Charlanne Cong, PTA 04/15/24, 10:54 AM

## 2024-04-15 NOTE — Progress Notes (Signed)
 Occupational Therapy Treatment Patient Details Name: Douglas Peterson. MRN: [id number] DOB: 11-27-1950 Today's Date: 04/15/2024   History of present illness Douglas Ballow. is a 73 y.o. male with medical history significant of atrial fibrillation, hypertension, anxiety, hyperlipidemia, GERD, constipation, and alcohol dependence. After presenting to this ED on 04/08/2024 with acute alcohol intoxication, the patient has again presented here for intoxication, but wishing for detox and sobriety.   OT comments  Douglas Peterson was seen for OT treatment on this date. Upon arrival to room pt seated in chair, agreeable to tx. Pt currently IND don shirt and shorts and don/doff B shoes in sitting. MOD I + SPC for ADL t/f ~300 ft. SBA for x8 stairs navigation with rail use - reports rail on front steps, no rail at garage steps (uses door knob). All goals met, no skilled acute OT needs identified, will sign off. Discharge recommendation remains appropriate.       If plan is discharge home, recommend the following:  A little help with walking and/or transfers;A little help with bathing/dressing/bathroom;Assistance with cooking/housework;Direct supervision/assist for medications management;Assist for transportation   Equipment Recommendations  None recommended by OT    Recommendations for Other Services      Precautions / Restrictions Precautions Precautions: Fall Recall of Precautions/Restrictions: Intact Restrictions Weight Bearing Restrictions Per Provider Order: No       Mobility Bed Mobility               General bed mobility comments: not tested    Transfers Overall transfer level: Modified independent Equipment used: Straight cane                     Balance Overall balance assessment: No apparent balance deficits (not formally assessed)                                         ADL either performed or assessed with clinical judgement   ADL Overall ADL's  : Needs assistance/impaired                                       General ADL Comments: IND don shirt and shorts and don/doff B shoes in sitting. MOD I + SPC for ADL t/f ~300 ft. SBA for x8 stairs navigation with rail use - reports rail on front steps, no rail at garage steps (uses door knob).    Extremity/Trunk Assessment              Vision       Perception     Praxis     Communication Communication Factors Affecting Communication: Reduced clarity of speech   Cognition Arousal: Alert Behavior During Therapy: WFL for tasks assessed/performed Cognition: No apparent impairments                               Following commands: Intact        Cueing   Cueing Techniques: Verbal cues  Exercises      Shoulder Instructions       General Comments      Pertinent Vitals/ Pain       Pain Assessment Pain Assessment: No/denies pain  Home Living  Prior Functioning/Environment              Frequency  Min 2X/week        Progress Toward Goals  OT Goals(current goals can now be found in the care plan section)  Progress towards OT goals: Goals met/education completed, patient discharged from OT  Acute Rehab OT Goals OT Goal Formulation: With patient Time For Goal Achievement: 04/26/24 Potential to Achieve Goals: Good ADL Goals Pt Will Perform Grooming: with modified independence;standing Pt Will Perform Lower Body Dressing: with modified independence;sit to/from stand Pt Will Transfer to Toilet: with modified independence;regular height toilet Pt Will Perform Toileting - Clothing Manipulation and hygiene: with modified independence;sit to/from stand  Plan      Co-evaluation                 AM-PAC OT 6 Clicks Daily Activity     Outcome Measure   Help from another person eating meals?: None Help from another person taking care of personal grooming?:  None Help from another person toileting, which includes using toliet, bedpan, or urinal?: None Help from another person bathing (including washing, rinsing, drying)?: None Help from another person to put on and taking off regular upper body clothing?: None Help from another person to put on and taking off regular lower body clothing?: None 6 Click Score: 24    End of Session    OT Visit Diagnosis: Unsteadiness on feet (R26.81);Other abnormalities of gait and mobility (R26.89);Repeated falls (R29.6);Muscle weakness (generalized) (M62.81)   Activity Tolerance Patient tolerated treatment well   Patient Left in chair;with call bell/phone within reach;with chair alarm set   Nurse Communication Mobility status        Time: 3244-0102 OT Time Calculation (min): 27 min  Charges: OT General Charges $OT Visit: 1 Visit OT Treatments $Self Care/Home Management : 8-22 mins $Therapeutic Activity: 8-22 mins  Gordan Latina, M.S. OTR/L  04/15/24, 3:01 PM  ascom [phone]

## 2024-04-21 ENCOUNTER — Ambulatory Visit: Admitting: Cardiology

## 2024-07-11 ENCOUNTER — Emergency Department
Admission: EM | Admit: 2024-07-11 | Discharge: 2024-07-20 | Disposition: A | Discharge status: Skilled Nursing Facility | Attending: Emergency Medicine | Admitting: Emergency Medicine

## 2024-07-11 ENCOUNTER — Encounter: Payer: Self-pay | Admitting: Emergency Medicine

## 2024-07-11 ENCOUNTER — Emergency Department

## 2024-07-11 ENCOUNTER — Other Ambulatory Visit: Payer: Self-pay

## 2024-07-11 DIAGNOSIS — F10220 Alcohol dependence with intoxication, uncomplicated: Secondary | ICD-10-CM | POA: Diagnosis not present

## 2024-07-11 DIAGNOSIS — W050XXA Fall from non-moving wheelchair, initial encounter: Secondary | ICD-10-CM | POA: Insufficient documentation

## 2024-07-11 DIAGNOSIS — S0990XA Unspecified injury of head, initial encounter: Secondary | ICD-10-CM | POA: Insufficient documentation

## 2024-07-11 DIAGNOSIS — I5032 Chronic diastolic (congestive) heart failure: Secondary | ICD-10-CM | POA: Diagnosis not present

## 2024-07-11 DIAGNOSIS — Z87820 Personal history of traumatic brain injury: Secondary | ICD-10-CM | POA: Insufficient documentation

## 2024-07-11 DIAGNOSIS — I6782 Cerebral ischemia: Secondary | ICD-10-CM | POA: Insufficient documentation

## 2024-07-11 DIAGNOSIS — Y907 Blood alcohol level of 200-239 mg/100 ml: Secondary | ICD-10-CM | POA: Diagnosis not present

## 2024-07-11 DIAGNOSIS — R531 Weakness: Secondary | ICD-10-CM | POA: Insufficient documentation

## 2024-07-11 DIAGNOSIS — R4789 Other speech disturbances: Secondary | ICD-10-CM | POA: Insufficient documentation

## 2024-07-11 DIAGNOSIS — I480 Paroxysmal atrial fibrillation: Secondary | ICD-10-CM | POA: Insufficient documentation

## 2024-07-11 DIAGNOSIS — G319 Degenerative disease of nervous system, unspecified: Secondary | ICD-10-CM | POA: Diagnosis not present

## 2024-07-11 DIAGNOSIS — R2689 Other abnormalities of gait and mobility: Secondary | ICD-10-CM | POA: Insufficient documentation

## 2024-07-11 DIAGNOSIS — Z9181 History of falling: Secondary | ICD-10-CM | POA: Diagnosis not present

## 2024-07-11 DIAGNOSIS — I69851 Hemiplegia and hemiparesis following other cerebrovascular disease affecting right dominant side: Secondary | ICD-10-CM | POA: Diagnosis not present

## 2024-07-11 DIAGNOSIS — I110 Hypertensive heart disease with heart failure: Secondary | ICD-10-CM | POA: Diagnosis not present

## 2024-07-11 DIAGNOSIS — F1020 Alcohol dependence, uncomplicated: Secondary | ICD-10-CM | POA: Insufficient documentation

## 2024-07-11 DIAGNOSIS — M47892 Other spondylosis, cervical region: Secondary | ICD-10-CM | POA: Insufficient documentation

## 2024-07-11 DIAGNOSIS — F1090 Alcohol use, unspecified, uncomplicated: Secondary | ICD-10-CM

## 2024-07-11 DIAGNOSIS — W19XXXA Unspecified fall, initial encounter: Secondary | ICD-10-CM

## 2024-07-11 LAB — ETHANOL: Alcohol, Ethyl (B): 230 mg/dL — ABNORMAL HIGH (ref <15)

## 2024-07-11 LAB — BETA-HYDROXYBUTYRIC ACID: Beta-Hydroxybutyric Acid: 0.31 mmol/L — ABNORMAL HIGH (ref 0.05–0.27)

## 2024-07-11 LAB — URINALYSIS, ROUTINE W REFLEX MICROSCOPIC
Bacteria, UA: NONE SEEN
Bilirubin Urine: NEGATIVE
Glucose, UA: NEGATIVE mg/dL
Hgb urine dipstick: NEGATIVE
Ketones, ur: NEGATIVE mg/dL
Leukocytes,Ua: NEGATIVE
Nitrite: NEGATIVE
Protein, ur: NEGATIVE mg/dL
Specific Gravity, Urine: 1.003 — ABNORMAL LOW (ref 1.005–1.030)
Squamous Epithelial / HPF: 0 /HPF (ref 0–5)
pH: 6 (ref 5.0–8.0)

## 2024-07-11 LAB — BASIC METABOLIC PANEL WITH GFR
Anion gap: 16 — ABNORMAL HIGH (ref 5–15)
BUN: <5 mg/dL — ABNORMAL LOW (ref 8–23)
CO2: 24 mmol/L (ref 22–32)
Calcium: 8.2 mg/dL — ABNORMAL LOW (ref 8.9–10.3)
Chloride: 90 mmol/L — ABNORMAL LOW (ref 98–111)
Creatinine, Ser: 0.36 mg/dL — ABNORMAL LOW (ref 0.61–1.24)
GFR, Estimated: >60 mL/min (ref >60)
Glucose, Bld: 84 mg/dL (ref 70–99)
Potassium: 3.8 mmol/L (ref 3.5–5.1)
Sodium: 130 mmol/L — ABNORMAL LOW (ref 135–145)

## 2024-07-11 LAB — CK: Total CK: 165 U/L (ref 49–397)

## 2024-07-11 LAB — MAGNESIUM: Magnesium: 1.7 mg/dL (ref 1.7–2.4)

## 2024-07-11 MED ORDER — THIAMINE HCL 100 MG/ML IJ SOLN
100.0000 mg | Freq: Every day | INTRAMUSCULAR | Status: DC
  Administered 2024-07-12: 100 mg via INTRAVENOUS
  Filled 2024-07-11 (×2): qty 2

## 2024-07-11 MED ORDER — THIAMINE MONONITRATE 100 MG PO TABS
100.0000 mg | ORAL_TABLET | Freq: Every day | ORAL | Status: DC
  Administered 2024-07-11 – 2024-07-20 (×10): 100 mg via ORAL
  Filled 2024-07-11 (×12): qty 1

## 2024-07-11 MED ORDER — SODIUM CHLORIDE 0.9 % IV BOLUS
500.0000 mL | Freq: Once | INTRAVENOUS | Status: AC
Start: 2024-07-11 — End: 2024-07-12
  Administered 2024-07-12: 500 mL via INTRAVENOUS

## 2024-07-11 MED ORDER — LORAZEPAM 2 MG/ML IJ SOLN: 1.0000 mg | INTRAMUSCULAR | Status: DC | PRN

## 2024-07-11 MED ORDER — LORAZEPAM 1 MG PO TABS
1.0000 mg | ORAL_TABLET | ORAL | Status: DC | PRN
  Administered 2024-07-12 – 2024-07-13 (×3): 1 mg via ORAL
  Filled 2024-07-11: qty 2
  Filled 2024-07-11 (×2): qty 1

## 2024-07-11 MED ORDER — ADULT MULTIVITAMIN W/MINERALS CH
1.0000 | ORAL_TABLET | Freq: Every day | ORAL | Status: DC
  Administered 2024-07-11 – 2024-07-20 (×10): 1 via ORAL
  Filled 2024-07-11 (×10): qty 1

## 2024-07-11 MED ORDER — FOLIC ACID 1 MG PO TABS
1.0000 mg | ORAL_TABLET | Freq: Every day | ORAL | Status: DC
  Administered 2024-07-11 – 2024-07-20 (×10): 1 mg via ORAL
  Filled 2024-07-11 (×10): qty 1

## 2024-07-11 MED ORDER — THIAMINE HCL 100 MG/ML IJ SOLN
100.0000 mg | Freq: Every day | INTRAMUSCULAR | Status: DC
  Filled 2024-07-11: qty 2

## 2024-07-11 NOTE — ED Provider Notes (Signed)
 [facility] Provider Note    Event Date/Time   First MD Initiated Contact with Patient 07/11/24 1947     (approximate)   History   Fall   HPI  Douglas Peterson. is a 73 y.o. male history of electrolyte abnormalities, ethanol abuse, atrial fibrillation and cirrhosis  Patient relates he fell at home.  He has been on the ground for about an hour when he called EMS.  Reports he sort of hit his head he slipped on wheelchair onto the ground.  He has been using alcohol daily for the last several months.  He relates a plan to continue to use alcohol.  He has tried treatment, but continues to drink.  Denies any concerns at this time.  No pain or discomfort but does relate that he hit his head.  EMS reports that he did complain of some pain or discomfort over his tailbone area earlier   Noted in chart that the patient has a legal guardian but documentation indicates that the patient has a guardian and was released from guardianship in 2024  Physical Exam   Triage Vital Signs: ED Triage Vitals  Encounter Vitals Group     BP 07/11/24 1951 (!) 156/80     Girls Systolic BP Percentile --      Girls Diastolic BP Percentile --      Boys Systolic BP Percentile --      Boys Diastolic BP Percentile --      Pulse Rate 07/11/24 1951 94     Resp 07/11/24 1951 20     Temp 07/11/24 1951 97.9 F (36.6 C)     Temp Source 07/11/24 1951 Oral     SpO2 07/11/24 1951 97 %     Weight --      Height --      Head Circumference --      Peak Flow --      Pain Score 07/11/24 1955 10     Pain Loc --      Pain Education --      Exclude from Growth Chart --     Most recent vital signs: Vitals:   07/11/24 1951  BP: (!) 156/80  Pulse: 94  Resp: 20  Temp: 97.9 F (36.6 C)  SpO2: 97%     General: Awake, no distress.  Normocephalic atraumatic.  Alert.  Right hand held in slight flexion, patient reports that is chronic and due to an old injury 30 years ago CV:  Good  peripheral perfusion.  Normal tones and rate Resp:  Normal effort.  Clear bilateral Abd:  No distention.  Soft nontender Other:  Moves extremities well with exception of the right hand which he reports is chronically injured.  No obvious deficits.  He is alert, oriented to self not able to tell me the exact day.  Tells me he sits at the house drinks pretty much consistently all day long. Normal extraocular movements and conjugate gaze.  Speech is clear.  Mild conjunctival injection bilateral  No obvious trauma or injury.  He is covered in urine on his legs and also dried feces.  He appears disheveled and poor hygiene.   ED Results / Procedures / Treatments   Labs (all labs ordered are listed, but only abnormal results are displayed) Labs Reviewed  CBC  BASIC METABOLIC PANEL WITH GFR  MAGNESIUM   ETHANOL  CK   Labs reviewed.  Elevated ethanol level anticipated.  Minimal electrolyte derangements, slightly elevated anion gap,  query secondary to ethanol use or potentially to cause such as starvation ketosis.  He is not have any evidence of infection no hypotension no findings would be high suggestive of elevated lactic acid.  CK normal.  No overdose  RADIOLOGY  CT Head Wo Contrast Result Date: 07/11/2024 EXAM: CT HEAD AND CERVICAL SPINE 07/11/2024 08:29:46 PM TECHNIQUE: CT of the head and cervical spine was performed without the administration of intravenous contrast. Multiplanar reformatted images are provided for review. Automated exposure control, iterative reconstruction, and/or weight based adjustment of the mA/kV was utilized to reduce the radiation dose to as low as reasonably achievable. COMPARISON: CT head 03/19/2024 and CT cervical spine 08/16/2023 CLINICAL HISTORY: Facial trauma, blunt. Table formatting from the original note was not included. Images from the original note were not included. Arrived by EMS due to a fall from intoxication. EMS stated they found him on the floor. Pt  states he was only down 1 hour but appears possibly longer. Pt had no pants on, covered in urine and feces. Over 100 bottles around the area. Pt is complaining of lower back/tailbone pain from the fall. FINDINGS: CT HEAD BRAIN AND VENTRICLES: No acute intracranial hemorrhage. No mass effect or midline shift. No abnormal extra-axial fluid collection. No evidence of acute infarct. No hydrocephalus. Chronic microvascular ischemia and generalized atrophy. ORBITS: No acute abnormality. SINUSES AND MASTOIDS: No acute abnormality. SOFT TISSUES AND SKULL: No acute skull fracture. No acute soft tissue abnormality. CT CERVICAL SPINE BONES AND ALIGNMENT: No acute fracture or traumatic malalignment. DEGENERATIVE CHANGES: Multilevel spondylosis, disc space height loss, and degenerative endplate changes greatest at C5-C6 and C6-C7. No severe spinal canal narrowing. Multilevel facet arthropathy ankylosis of the left C4-C5 facets. SOFT TISSUES: No prevertebral soft tissue swelling. IMPRESSION: 1. No acute intracranial abnormality. 2. No acute fracture or traumatic malalignment of the cervical spine. Electronically signed by: Norman Gatlin MD 07/11/2024 08:52 PM EDT RP Workstation: [id number]   CT Cervical Spine Wo Contrast Result Date: 07/11/2024 EXAM: CT HEAD AND CERVICAL SPINE 07/11/2024 08:29:46 PM TECHNIQUE: CT of the head and cervical spine was performed without the administration of intravenous contrast. Multiplanar reformatted images are provided for review. Automated exposure control, iterative reconstruction, and/or weight based adjustment of the mA/kV was utilized to reduce the radiation dose to as low as reasonably achievable. COMPARISON: CT head 03/19/2024 and CT cervical spine 08/16/2023 CLINICAL HISTORY: Facial trauma, blunt. Table formatting from the original note was not included. Images from the original note were not included. Arrived by EMS due to a fall from intoxication. EMS stated they found him on the  floor. Pt states he was only down 1 hour but appears possibly longer. Pt had no pants on, covered in urine and feces. Over 100 bottles around the area. Pt is complaining of lower back/tailbone pain from the fall. FINDINGS: CT HEAD BRAIN AND VENTRICLES: No acute intracranial hemorrhage. No mass effect or midline shift. No abnormal extra-axial fluid collection. No evidence of acute infarct. No hydrocephalus. Chronic microvascular ischemia and generalized atrophy. ORBITS: No acute abnormality. SINUSES AND MASTOIDS: No acute abnormality. SOFT TISSUES AND SKULL: No acute skull fracture. No acute soft tissue abnormality. CT CERVICAL SPINE BONES AND ALIGNMENT: No acute fracture or traumatic malalignment. DEGENERATIVE CHANGES: Multilevel spondylosis, disc space height loss, and degenerative endplate changes greatest at C5-C6 and C6-C7. No severe spinal canal narrowing. Multilevel facet arthropathy ankylosis of the left C4-C5 facets. SOFT TISSUES: No prevertebral soft tissue swelling. IMPRESSION: 1. No acute intracranial abnormality. 2. No  acute fracture or traumatic malalignment of the cervical spine. Electronically signed by: Norman Gatlin MD 07/11/2024 08:52 PM EDT RP Workstation: [id number]      PROCEDURES:  Critical Care performed: No  Procedures   MEDICATIONS ORDERED IN ED: Medications  LORazepam  (ATIVAN ) tablet 1-4 mg (has no administration in time range)    Or  LORazepam  (ATIVAN ) injection 1-4 mg (has no administration in time range)  thiamine  (VITAMIN B1) tablet 100 mg (has no administration in time range)    Or  thiamine  (VITAMIN B1) injection 100 mg (has no administration in time range)  folic acid  (FOLVITE ) tablet 1 mg (has no administration in time range)  multivitamin with minerals tablet 1 tablet (has no administration in time range)  thiamine  (VITAMIN B1) injection 100 mg (has no administration in time range)  sodium chloride  0.9 % bolus 500 mL (has no administration in time range)      IMPRESSION / MDM / ASSESSMENT AND PLAN / ED COURSE  I reviewed the triage vital signs and the nursing notes.                              Differential diagnosis includes, but is not limited to, fall in the setting of reported ongoing ethanol use.  Clinically does not appear acutely intoxicated except for conjunctiva are slightly injected.  He does report a fall on the ground striking his head no obvious injury on exam and no new focal deficits.  He is alert, oriented fairly well but not to exact day but has a cell phone has been using it, appears he is using the [employer] app without difficulty.  Reportedly uses the [employer] app to order alcohol that was home  He has a history of previous severe electrolyte derangements.  I suspect that he likely fell in the last hour or so, but given his heavy alcohol use it is hard to know.  Check labs exclude rhabdo, major electrolyte abnormalities etc.  Stable hemodynamics.  Patient's presentation is most consistent with acute complicated illness / injury requiring diagnostic workup.      Clinical Course as of 07/11/24 2331  Mon Jul 11, 2024  2109 Resting comfortable without distress.  Diet ordered.  CT imaging of the head and neck negative for acute findings. [MQ]    Clinical Course User Index [MQ] Dicky Anes, MD   ----------------------------------------- 11:32 PM on 07/11/2024 ----------------------------------------- Ongoing care signed to Dr. Willo. Beta hydroxy pending (elevated gap) Follow-up on pending beta-hydroxybutyrate.  TOC consult requested.  Patient awake and alert but advised he does not have any way to get home.  Remains on CIWA.  Patient does have a history of complicated electrolyte derangements, withdrawals etc. in the past.  At this point he does not have a way to get home, and TOC has been requested.    FINAL CLINICAL IMPRESSION(S) / ED DIAGNOSES   Final diagnoses:  None     Rx / DC Orders   ED Discharge Orders      None        Note:  This document was prepared using Dragon voice recognition software and may include unintentional dictation errors.   Dicky Anes, MD 07/11/24 2348

## 2024-07-11 NOTE — ED Triage Notes (Signed)
 Arrived by EMS due to a fall from intoxication.  EMS stated they found him on the floor.  Pt states he was only down 1 hour but appears possibly longer.  Pt had no pants on, covered in urine and feces.  Over 100 bottles around the area. Pt is complaining of lower back/tailbone pain from the fall.

## 2024-07-12 LAB — CBC WITH DIFFERENTIAL/PLATELET
Abs Immature Granulocytes: 0.02 K/uL (ref 0.00–0.07)
Basophils Absolute: 0.1 K/uL (ref 0.0–0.1)
Basophils Relative: 1 %
Eosinophils Absolute: 0.1 K/uL (ref 0.0–0.5)
Eosinophils Relative: 1 %
HCT: 37.5 % — ABNORMAL LOW (ref 39.0–52.0)
Hemoglobin: 13.5 g/dL (ref 13.0–17.0)
Immature Granulocytes: 0 %
Lymphocytes Relative: 42 %
Lymphs Abs: 4 K/uL (ref 0.7–4.0)
MCH: 31.8 pg (ref 26.0–34.0)
MCHC: 36 g/dL (ref 30.0–36.0)
MCV: 88.2 fL (ref 80.0–100.0)
Monocytes Absolute: 0.6 K/uL (ref 0.1–1.0)
Monocytes Relative: 6 %
Neutro Abs: 4.9 K/uL (ref 1.7–7.7)
Neutrophils Relative %: 50 %
Platelets: 211 K/uL (ref 150–400)
RBC: 4.25 MIL/uL (ref 4.22–5.81)
RDW: 13.2 % (ref 11.5–15.5)
WBC: 9.7 K/uL (ref 4.0–10.5)
nRBC: 0 % (ref 0.0–0.2)

## 2024-07-12 MED ORDER — ATENOLOL 25 MG PO TABS
25.0000 mg | ORAL_TABLET | Freq: Every day | ORAL | Status: DC
  Administered 2024-07-13 – 2024-07-20 (×6): 25 mg via ORAL
  Filled 2024-07-12 (×7): qty 1

## 2024-07-12 MED ORDER — BACLOFEN 10 MG PO TABS
10.0000 mg | ORAL_TABLET | Freq: Three times a day (TID) | ORAL | Status: DC | PRN
  Administered 2024-07-12: 10 mg via ORAL
  Filled 2024-07-12 (×2): qty 1

## 2024-07-12 MED ORDER — CHLORDIAZEPOXIDE HCL 25 MG PO CAPS
50.0000 mg | ORAL_CAPSULE | Freq: Once | ORAL | Status: AC
  Administered 2024-07-12: 50 mg via ORAL
  Filled 2024-07-12: qty 2

## 2024-07-12 MED ORDER — ENSURE ENLIVE PO LIQD
237.0000 mL | Freq: Two times a day (BID) | ORAL | Status: DC
Start: 2024-07-13 — End: 2024-07-20
  Administered 2024-07-13 – 2024-07-20 (×13): 237 mL via ORAL
  Filled 2024-07-12: qty 237

## 2024-07-12 MED ORDER — VENLAFAXINE HCL 25 MG PO TABS
25.0000 mg | ORAL_TABLET | Freq: Two times a day (BID) | ORAL | Status: DC
  Administered 2024-07-12 – 2024-07-20 (×16): 25 mg via ORAL
  Filled 2024-07-12 (×19): qty 1

## 2024-07-12 MED ORDER — AMLODIPINE BESYLATE 5 MG PO TABS
5.0000 mg | ORAL_TABLET | Freq: Every day | ORAL | Status: DC
  Administered 2024-07-12 – 2024-07-20 (×9): 5 mg via ORAL
  Filled 2024-07-12 (×9): qty 1

## 2024-07-12 MED ORDER — PANTOPRAZOLE SODIUM 40 MG PO TBEC
40.0000 mg | DELAYED_RELEASE_TABLET | Freq: Every day | ORAL | Status: DC
  Administered 2024-07-12 – 2024-07-20 (×9): 40 mg via ORAL
  Filled 2024-07-12 (×9): qty 1

## 2024-07-12 MED ORDER — ATORVASTATIN CALCIUM 20 MG PO TABS
10.0000 mg | ORAL_TABLET | Freq: Every day | ORAL | Status: DC
  Administered 2024-07-13 – 2024-07-20 (×8): 10 mg via ORAL
  Filled 2024-07-12 (×8): qty 1

## 2024-07-12 MED ORDER — BACLOFEN 5 MG PO TABS
10.0000 mg | ORAL_TABLET | Freq: Three times a day (TID) | ORAL | Status: DC | PRN
Start: 2024-07-12 — End: 2024-07-12

## 2024-07-12 NOTE — ED Provider Notes (Signed)
 Emergency Medicine Observation Re-evaluation Note  Douglas Peterson. is a 73 y.o. male, seen on rounds today.  Pt initially presented to the ED for complaints of Fall  Currently, the patient is is no acute distress. Denies any concerns at this time.  Physical Exam  Blood pressure (!) 163/83, pulse (!) 101, temperature 97.9 F (36.6 C), temperature source Oral, resp. rate 18, SpO2 97%.  Physical Exam: General: No apparent distress Pulm: Normal WOB Neuro: Moving all extremities Psych: Resting comfortably     ED Course / MDM   Clinical Course as of 07/12/24 1045  Mon Jul 11, 2024  2109 Resting comfortable without distress.  Diet ordered.  CT imaging of the head and neck negative for acute findings. [MQ]  Tue Jul 12, 2024  0708 Presents with acute intoxication - now medically cleared.  Unclear of a safe discharge plan.  History of similar and signing out -  []  TOC pending.  CIWA ordered.  [SM]  1045 Patient with some tremulousness and hypertension.  Appears to have chronic hypertension.  Will give a dose of Librium  to prevent alcohol withdrawal.  Will repeat a CIWA score in 1 hour and if elevated will give a dose of Ativan . [SM]    Clinical Course User Index [MQ] Dicky Anes, MD [SM] Suzanne Kirsch, MD    I have reviewed the labs performed to date as well as medications administered while in observation.  Recent changes in the last 24 hours include: No acute events overnight.  Plan   Current plan: Patient awaiting psychiatric disposition. [name] not under full IVC at this time.    Suzanne Kirsch, MD 07/12/24 [phone]

## 2024-07-12 NOTE — Discharge Instructions (Signed)
  Access the 24-Hour Nurse Line at? [phone]?(TTY: 711), 24 hours a day, 7 days a week.? Keep in mind: Nurses can't diagnose, prescribe medication or give medical advice. You can also find this number on your member ID card.      Substance Use Resources  http://lester.info/  SAMHSA's National Helpline is a free, confidential, 24/7, 365-day-a-year treatment referral and information service (in [place] and [place]) for individuals and families facing mental and/or substance use disorders.  1-800-662-HELP (4357)  If you need to talk, the 93 Lifeline is here. At the 988 Suicide & Crisis Lifeline, we understand that life's challenges can sometimes be difficult. Whether you're facing mental health struggles, emotional distress, alcohol or drug use concerns, or just need someone to talk to, our caring counselors are here for you. You are not alone. For immediate help from a caring, skilled counselor, please call or text 988 or chat with us . For life-threatening emergencies, call 911 and ask for a [employer] Estate agent. They have special training.  Mobile Crisis Management services provide intensive, on-site response, stabilization, and intervention for individuals of all ages who are experiencing a crisis related to mental health disturbances, developmental disabilities, or addiction. Our team of behavioral health professionals is available 24/7/365 to provide confidential and secure stabilization services for individuals in their homes, workplaces, schools, or anywhere within the community where a crisis occurs: Armed forces technical officer Crisis symptoms may include: Hearing voices Experiencing hallucinations Displaying irrational behavior Expressing intent to harm oneself or others Intravenous (IV) drug use Withdrawal from illegal substance use Using drugs while pregnant Becoming unmanageable due to mental illness Call [phone] for [facility].  Residential Treatment  Services of [employer] [facility] Behavioral health providers Family members & self-referrals Medicaid (VAYA, Alliance, [employer], Partners, Tourist information centre manager, and Blue Cross Blue Shield - Healthy Blue) For admission inquiries: Detox & Crisis Services: [phone] [facility]: [phone] [address] Recovery Home: [phone] Main Location: [address] [place], [place] [address] Admission: [phone] [email or web address].[email or web address]  [facility]AK. [facility] [address] [place], [place], [place] [address] Phone: [phone]  [facility] [address] [place], [place] [address] [phone]   Alcohol and Drug [facility] [address] [place], [address] [place], [place] [address] [phone]  [facility] Health Crisis Line 24 hours a day, 7 days a week [phone]  Comprehensive Care Center: [facility], 24/7 [facility] [address] [place], [place] [address] [phone]  Clinics  [facility] [address] [place], [place] [address] [phone]    [facility] [address] [place], [place] [address] [phone]  [facility] [address] [place], [place] [address] Phone #: [phone]  [facility] [address] [place], [place] [address] [phone]  Use this link to locate free and charitable clinics-   CheatPrevention.com.[email or web address]  [facility] [address] [place], [place] [address]  [phone]  [facility] [address] [place], [place] [address] [phone]  [facility] [address] [place], [place] [address] Main [phone]  [facility] [address] [place], [place] [address] [phone]

## 2024-07-12 NOTE — TOC Initial Note (Addendum)
 Transition of Care (TOC) - Initial/Assessment Note    Patient Details  Name: Douglas Peterson. MRN: [id number] Date of Birth: 1951-05-26  Transition of Care [facility]) CM/SW Contact:    Edsel DELENA Fischer, LCSW Phone Number: 07/12/2024, 6:50 PM  Clinical Narrative:                  SW met with pt at bedside.  Pt stated his number is [phone].  Pt had a soft, low, raspy voice and was hard of hearing at times.  Pt stated that he would like to return home.  Pt stated that he may be able to get his neighbor to take him home.  Pt stated he does have his own transportation.  Pt stated that he uses pullups.  Pt stated that he is open to [facility] and would like to have someone help him clean the home.  Pt stated that [facility] was offered before but noone came to the home.  Pt stated that he does drink and has been drinking most of his life.  Pt stated that he is not sure why he is in the ED.  Pt stated that he receives about $1,500 a month.  Pt stated that he has  lawyer that assists him as well but was not able to find his lawyers number at the time of visit. SW was concerned during visit because pt seem as though he would get a little choked up after eating and he cough a lot during the visit. SW message MD about concerns and suggest SLP eval.  SW added substance use information (inpatient, outpatient and community resources) to pt AVS   Expected Discharge Plan: Home/Self Care Barriers to Discharge: Continued Medical Work up   Patient Goals and CMS Choice Patient states their goals for this hospitalization and ongoing recovery are:: return home with aide support          Expected Discharge Plan and Services In-house Referral: Clinical Social Work     Living arrangements for the past 2 months: Single Family Home                                      Prior Living Arrangements/Services Living arrangements for the past 2 months: Single Family Home Lives with:: Self Patient language and need  for interpreter reviewed:: Yes Do you feel safe going back to the place where you live?: Yes          Current home services: DME (wheelchair, cane ,walker) Criminal Activity/Legal Involvement Pertinent to Current Situation/Hospitalization: No - Comment as needed  Activities of Daily Living      Permission Sought/Granted                  Emotional Assessment Appearance:: Appears younger than stated age Attitude/Demeanor/Rapport: Engaged Affect (typically observed): Accepting Orientation: : Oriented to Place, Oriented to Situation, Oriented to  Time, Oriented to Self Alcohol / Substance Use: Alcohol Use Psych Involvement: No (comment)  Admission diagnosis:  Fall Intoxication Patient Active Problem List   Diagnosis Date Noted   Head injury 03/25/2024   Alcohol withdrawal (HCC) 03/19/2024   Incarcerated hernia 01/27/2024   S/P laparoscopic hernia repair 01/27/2024   Intractable nausea and vomiting 10/06/2023   Protein-calorie malnutrition, severe 08/20/2023   Ground-level fall 08/16/2023   GERD without esophagitis 08/06/2023   Nausea 07/19/2023   Pressure injury of skin 07/11/2023   Alcohol abuse  07/11/2023   Abdominal pain 06/16/2023   Elevated lactic acid level 06/16/2023   Chronic diastolic CHF (congestive heart failure) (HCC) 06/16/2023   Increased urinary frequency 06/08/2023   Diarrhea 06/08/2023   Upper respiratory infection 06/04/2023   Enteritis 06/02/2023   Polyuria 06/02/2023   Dyslipidemia 05/01/2023   Alcohol dependence (HCC) 05/01/2023   Maggot infestation feet 05/11/2022   Unable to care for self 05/11/2022   Debility 04/25/2022   Weakness 04/23/2022   Epistaxis 04/23/2022   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   Generalized weakness 03/18/2022   Nondisplaced fracture of shaft of left clavicle, initial encounter for closed fracture 03/18/2022   BPH (benign prostatic hyperplasia) 03/18/2022   Clavicle fracture 03/17/2022   Fall 03/17/2022    CAP (community acquired pneumonia) 03/13/2022   Altered mental status, unspecified 02/20/2022   Hypotension 02/19/2022   Dysphonia 08/13/2021   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   Paresthesia of right upper extremity 08/13/2021   SIRS (systemic inflammatory response syndrome) (HCC) 08/05/2020   GERD (gastroesophageal reflux disease) 06/05/2020   Alcohol use disorder, severe, dependence (HCC) 05/28/2020   Elevated LFTs 04/06/2020   Paroxysmal A-fib (HCC) 03/17/2020   Primary insomnia 02/06/2020   Left inguinal hernia 01/31/2020   Encounter for competency evaluation    Depression 01/17/2020   Pancytopenia (HCC)    Alcoholic cirrhosis of liver without ascites (HCC)    ETOH abuse    Alcohol intoxication (HCC)    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) 09/27/2019   Hypokalemia 09/27/2019   Dysphagia 09/27/2019   Alcohol withdrawal delirium, acute, hyperactive (HCC) 09/21/2019   Pressure injury of ankle, stage 1 08/15/2019   Palliative care by specialist    Closed fracture of right proximal humerus 03/19/2019   Vitamin D  deficiency 01/11/2019   Osteoporosis 12/22/2018   Closed nondisplaced fracture of acromial process of right scapula with routine healing 11/15/2018   Compression fracture of L2 vertebra with routine healing 11/15/2018   Delirium tremens (HCC) 03/08/2018   Essential hypertension 09/16/2017   Encephalopathy, 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   Steatohepatitis 12/03/2016   Abnormal liver enzymes 06/17/2016   Hereditary hemochromatosis (HCC) 06/17/2016   Anxiety 07/16/2015   Hyponatremia 07/09/2015   H/O traumatic brain injury 05/22/2014   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HCC) 05/22/2014   PCP:  Pcp, No Pharmacy:   [facility] [place] [place] - [address] [place] [address] Phone: [phone] Fax: [phone]     Social Drivers of Health (SDOH) Social History: SDOH Screenings   Food Insecurity: No Food Insecurity  (04/09/2024)  Housing: Low Risk  (04/09/2024)  Recent Concern: Housing - High Risk (01/27/2024)  Transportation Needs: Unmet Transportation Needs (04/09/2024)  Utilities: Not At Risk (04/09/2024)  Financial Resource Strain: Unknown (06/13/2019)  Physical Activity: Unknown (06/13/2019)  Social Connections: Socially Isolated (04/09/2024)  Stress: Unknown (06/13/2019)  Tobacco Use: Medium Risk (07/11/2024)   SDOH Interventions:     Readmission Risk Interventions    04/11/2024    5:11 PM 01/07/2024   11:31 AM 06/17/2023    4:23 PM  Readmission Risk Prevention Plan  Transportation Screening -- Complete Complete  HRI or Home Care Consult   Complete  Social Work Consult for Recovery Care Planning/Counseling   Complete  Palliative Care Screening   Not Applicable  Medication Review Oceanographer) -- Complete Complete  PCP or Specialist appointment within 3-5 days of discharge  Complete   HRI or Home Care Consult Patient refused  Complete   SW Recovery Care/Counseling Consult Patient refused Complete   Palliative Care Screening Patient Refused Not Applicable   Skilled Nursing Facility -- Not Applicable

## 2024-07-12 NOTE — TOC CM/SW Note (Signed)
..  Transition of Care [facility]) - Inpatient Brief Assessment   Patient Details  Name: Douglas Peterson. MRN: [id number] Date of Birth: 10/26/1951  Transition of Care [facility]) CM/SW Contact:    Edsel DELENA Fischer, LCSW Phone Number: 07/12/2024, 1:25 PM   Clinical Narrative:  TOC secure message DSS regarding pt guardianship. DSS reply     The Department is no longer the Guardian of [name], he was restored.  We currently are not working with him in any capacity.  If there is the need for an APS, please feel free to make one at [phone].     Thanks,     Verlinda   Transition of Care Asessment:

## 2024-07-13 NOTE — ED Notes (Signed)
 Received callback from Lauraine, the [facility] DSS on-call social worker tonight.

## 2024-07-13 NOTE — ED Notes (Signed)
 Pt in bed, pt requests some ativan , pt has no tremor, skin is warm and dry, pt c/o headache and anxiety.

## 2024-07-13 NOTE — ED Notes (Signed)
 Pt spilled meal tray on self, cleaned pt, modified bed bath, changed bedding and gown, pt has some redness to buttocks, redness blanches, pt turned onto R hip

## 2024-07-13 NOTE — ED Notes (Signed)
 Pt in bed with eyes closed, resps even and unlabored, pt arouses easily to verbal stim.  Upon arousal pt reports that he is very anxious and has a headache.  Pt states that he would like some ativan .  Pt then nodds back off.  MD notified, pt has no tremors, no sweats.

## 2024-07-13 NOTE — ED Notes (Addendum)
 Pt awoke from sleep and is stating that he wants to be discharged back home.  This RN called [facility] Communication [phone] to request DSS callback for information regarding legal guardian status.

## 2024-07-13 NOTE — ED Notes (Signed)
 [name] phone located in bed under pt's buttocks

## 2024-07-13 NOTE — TOC CM/SW Note (Addendum)
..  Transition of Care [facility]) - Inpatient Brief Assessment   Patient Details  Name: Douglas Peterson. MRN: [id number] Date of Birth: Oct 29, 1951  Transition of Care [facility]) CM/SW Contact:    Edsel DELENA Fischer, LCSW Phone Number: 07/13/2024, 9:30 AM   Clinical Narrative:  TOC added pcp/ clinics to AVS.  TOC messaged APS regarding update on report  Transition of Care Asessment:

## 2024-07-13 NOTE — ED Notes (Incomplete)
 Received callback from Lauraine, the [facility] DSS on-call social worker. Per Lauraine, there is an open case involving the pt, but there is no court appointed legal guardian. The pt previously had a court appointed legal guardian, but as of June, 2025 pt was deemed competent and guardianship was
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
## 2024-07-14 LAB — URINALYSIS, ROUTINE W REFLEX MICROSCOPIC
Glucose, UA: NEGATIVE mg/dL
Hgb urine dipstick: NEGATIVE
Ketones, ur: 5 mg/dL — AB
Leukocytes,Ua: NEGATIVE
Nitrite: POSITIVE — AB
Protein, ur: 100 mg/dL — AB
RBC / HPF: 0 RBC/hpf (ref 0–5)
Specific Gravity, Urine: 1.024 (ref 1.005–1.030)
Squamous Epithelial / HPF: 0 /HPF (ref 0–5)
pH: 8 (ref 5.0–8.0)

## 2024-07-14 MED ORDER — CEFUROXIME AXETIL 250 MG PO TABS
250.0000 mg | ORAL_TABLET | Freq: Two times a day (BID) | ORAL | Status: AC
Start: 2024-07-14 — End: 2024-07-18
  Administered 2024-07-14 – 2024-07-18 (×10): 250 mg via ORAL
  Filled 2024-07-14 (×10): qty 1
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
## 2024-07-14 NOTE — ED Notes (Signed)
 Patient given wipes to clean hands at this time.

## 2024-07-14 NOTE — ED Notes (Signed)
 Patient resting in non distress or complaints. Will continue to monitor.

## 2024-07-14 NOTE — ED Notes (Signed)
PT working with patient at this time.  

## 2024-07-14 NOTE — ED Provider Notes (Signed)
 Emergency Medicine Observation Re-evaluation Note   BP 120/77 (BP Location: Left Arm)   Pulse 72   Temp 97.7 F (36.5 C) (Oral)   Resp 16   Ht 5' 4 (1.626 m)   Wt 52.6 kg   SpO2 95%   BMI 19.90 kg/m    ED Course / MDM   No reported events during my shift at the time of this note.   Pt is awaiting dispo from consultants   Ginnie Shams MD    Shams Ginnie, MD 07/14/24 [phone]

## 2024-07-14 NOTE — ED Provider Notes (Signed)
 I am notified by nursing staff about change in patient's urine output.  Significantly darker in color with a foul smell developing in the past 1 day.  Patient has no particular complaints or abdominal pain or tenderness on exam.  We obtain a UA with many bacteria, nitrite positive that we will send for a culture.  Very different than his UA from a few days ago.  Will start him on 5 days of antibiotics pending this urine culture.   Claudene Rover, MD 07/14/24 [phone]

## 2024-07-14 NOTE — ED Notes (Signed)
 Pt eating breakfast tray independently at this time.

## 2024-07-14 NOTE — ED Notes (Signed)
Patient resting at this time. NAD noted.

## 2024-07-14 NOTE — ED Notes (Signed)
 Patient provided personal phone charger per request.

## 2024-07-14 NOTE — TOC Progression Note (Signed)
 Transition of Care (TOC) - Progression Note    Patient Details  Name: Douglas Peterson. MRN: [id number] Date of Birth: Jan 12, 1951  Transition [facility]) CM/SW Contact  Delphine KANDICE Bring, RN Phone Number: 07/14/2024, 4:05 PM  Clinical Narrative:    CM spoke with patient in the ED. Patient states that he has been at [facility] in the past. CM asked if he would be in agreement with CM sending referral to [facility].Patient's PASRR # is [id number] A. Referral sent out  Expected Discharge Plan: Home/Self Care Barriers to Discharge: Continued Medical Work up               Expected Discharge Plan and Services In-house Referral: Clinical Social Work     Living arrangements for the past 2 months: Single Family Home                                       Social Drivers of Health (SDOH) Interventions SDOH Screenings   Food Insecurity: No Food Insecurity (04/09/2024)  Housing: Low Risk  (04/09/2024)  Recent Concern: Housing - High Risk (01/27/2024)  Transportation Needs: Unmet Transportation Needs (04/09/2024)  Utilities: Not At Risk (04/09/2024)  Financial Resource Strain: Unknown (06/13/2019)  Physical Activity: Unknown (06/13/2019)  Social Connections: Socially Isolated (04/09/2024)  Stress: Unknown (06/13/2019)  Tobacco Use: Medium Risk (07/11/2024)    Readmission Risk Interventions    04/11/2024    5:11 PM 01/07/2024   11:31 AM 06/17/2023    4:23 PM  Readmission Risk Prevention Plan  Transportation Screening -- Complete Complete  HRI or Home Care Consult   Complete  Social Work Consult for Recovery Care Planning/Counseling   Complete  Palliative Care Screening   Not Applicable  Medication Review Oceanographer) -- Complete Complete  PCP or Specialist appointment within 3-5 days of discharge  Complete   HRI or Home Care Consult Patient refused Complete   SW Recovery Care/Counseling Consult Patient refused Complete   Palliative Care Screening Patient Refused Not Applicable   Skilled  Nursing Facility -- Not Applicable

## 2024-07-15 LAB — BASIC METABOLIC PANEL WITH GFR
Anion gap: 9 (ref 5–15)
BUN: 15 mg/dL (ref 8–23)
CO2: 26 mmol/L (ref 22–32)
Calcium: 8.7 mg/dL — ABNORMAL LOW (ref 8.9–10.3)
Chloride: 100 mmol/L (ref 98–111)
Creatinine, Ser: 0.51 mg/dL — ABNORMAL LOW (ref 0.61–1.24)
GFR, Estimated: >60 mL/min (ref >60)
Glucose, Bld: 108 mg/dL — ABNORMAL HIGH (ref 70–99)
Potassium: 3.5 mmol/L (ref 3.5–5.1)
Sodium: 135 mmol/L (ref 135–145)

## 2024-07-15 LAB — CBC WITH DIFFERENTIAL/PLATELET
Abs Immature Granulocytes: 0.03 K/uL (ref 0.00–0.07)
Basophils Absolute: 0.1 K/uL (ref 0.0–0.1)
Basophils Relative: 1 %
Eosinophils Absolute: 0.2 K/uL (ref 0.0–0.5)
Eosinophils Relative: 2 %
HCT: 41.5 % (ref 39.0–52.0)
Hemoglobin: 14.4 g/dL (ref 13.0–17.0)
Immature Granulocytes: 0 %
Lymphocytes Relative: 36 %
Lymphs Abs: 2.7 K/uL (ref 0.7–4.0)
MCH: 32 pg (ref 26.0–34.0)
MCHC: 34.7 g/dL (ref 30.0–36.0)
MCV: 92.2 fL (ref 80.0–100.0)
Monocytes Absolute: 0.8 K/uL (ref 0.1–1.0)
Monocytes Relative: 10 %
Neutro Abs: 3.8 K/uL (ref 1.7–7.7)
Neutrophils Relative %: 51 %
Platelets: 203 K/uL (ref 150–400)
RBC: 4.5 MIL/uL (ref 4.22–5.81)
RDW: 13.3 % (ref 11.5–15.5)
WBC: 7.5 K/uL (ref 4.0–10.5)
nRBC: 0 % (ref 0.0–0.2)

## 2024-07-15 LAB — URINE CULTURE

## 2024-07-15 MED ORDER — ACETAMINOPHEN 500 MG PO TABS
1000.0000 mg | ORAL_TABLET | Freq: Four times a day (QID) | ORAL | Status: DC | PRN
  Administered 2024-07-15 – 2024-07-20 (×4): 1000 mg via ORAL
  Filled 2024-07-15 (×4): qty 2

## 2024-07-15 MED ORDER — SODIUM CHLORIDE 0.9 % IV BOLUS
1000.0000 mL | Freq: Once | INTRAVENOUS | Status: AC
  Administered 2024-07-15: 1000 mL via INTRAVENOUS

## 2024-07-15 NOTE — ED Notes (Signed)
 Pt provided pericare and full linen change. Sacral patch placed on sacrum to prevent skin breakdown.

## 2024-07-15 NOTE — ED Provider Notes (Signed)
-----------------------------------------   6:57 AM on 07/15/2024 -----------------------------------------   Blood pressure (!) 139/90, pulse 83, temperature 97.9 F (36.6 C), temperature source Oral, resp. rate 12, height 5' 4 (1.626 m), weight 52.6 kg, SpO2 94%.  The patient is calm and cooperative at this time.  There have been no acute events since the last update.  Awaiting disposition plan from case management/social work.  Patient specifically requesting Ativan  overnight.  He is not in any sign of alcohol withdrawal.  Throughout my entire shift he has been asleep.  No indication to give benzodiazepines at this time.   Tashena Ibach, Josette SAILOR, DO 07/15/24 [phone]
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
## 2024-07-15 NOTE — ED Provider Notes (Signed)
 [facility] Observation Note   ----------------------------------------- 7:35 PM on 07/15/2024 -----------------------------------------  Douglas Peterson. is a 73 y.o. male currently boarding in the Emergency Department.  No acute events since last update.  Recent Vitals   Most recent vital signs: Vitals:   07/15/24 1012 07/15/24 1017  BP: (!) 158/89 (!) 158/89  Pulse: 82 89  Resp:  16  Temp:  97.9 F (36.6 C)  SpO2:  94%    ED Results / Procedures / Treatments   Labs (all labs ordered are listed, but only abnormal results are displayed) Labs Reviewed  URINE CULTURE - Abnormal; Notable for the following components:      Result Value   Culture MULTIPLE SPECIES PRESENT, SUGGEST RECOLLECTION (*)    All other components within normal limits  BASIC METABOLIC PANEL WITH GFR - Abnormal; Notable for the following components:   Sodium 130 (*)    Chloride 90 (*)    BUN <5 (*)    Creatinine, Ser 0.36 (*)    Calcium  8.2 (*)    Anion gap 16 (*)    All other components within normal limits  ETHANOL - Abnormal; Notable for the following components:   Alcohol, Ethyl (B) 230 (*)    All other components within normal limits  URINALYSIS, ROUTINE W REFLEX MICROSCOPIC - Abnormal; Notable for the following components:   Color, Urine STRAW (*)    APPearance CLEAR (*)    Specific Gravity, Urine 1.003 (*)    All other components within normal limits  BETA-HYDROXYBUTYRIC ACID - Abnormal; Notable for the following components:   Beta-Hydroxybutyric Acid 0.31 (*)    All other components within normal limits  CBC WITH DIFFERENTIAL/PLATELET - Abnormal; Notable for the following components:   HCT 37.5 (*)    All other components within normal limits  URINALYSIS, ROUTINE W REFLEX MICROSCOPIC - Abnormal; Notable for the following components:   Color, Urine AMBER (*)    APPearance CLOUDY (*)    Bilirubin Urine SMALL (*)    Ketones, ur 5 (*)    Protein, ur 100 (*)     Nitrite POSITIVE (*)    Bacteria, UA MANY (*)    All other components within normal limits  BASIC METABOLIC PANEL WITH GFR - Abnormal; Notable for the following components:   Glucose, Bld 108 (*)    Creatinine, Ser 0.51 (*)    Calcium  8.7 (*)    All other components within normal limits  MAGNESIUM   CK  CBC WITH DIFFERENTIAL/PLATELET    MEDICATIONS ORDERED IN ED: Medications  thiamine  (VITAMIN B1) tablet 100 mg (100 mg Oral Given 07/15/24 1013)    Or  thiamine  (VITAMIN B1) injection 100 mg ( Intravenous See Alternative 07/15/24 1013)  folic acid  (FOLVITE ) tablet 1 mg (1 mg Oral Given 07/15/24 1012)  multivitamin with minerals tablet 1 tablet (1 tablet Oral Given 07/15/24 1012)  amLODipine  (NORVASC ) tablet 5 mg (5 mg Oral Given 07/15/24 1013)  pantoprazole  (PROTONIX ) EC tablet 40 mg (40 mg Oral Given 07/15/24 1012)  feeding supplement (ENSURE ENLIVE / ENSURE PLUS) liquid 237 mL (237 mLs Oral Given 07/15/24 1359)  venlafaxine  (EFFEXOR ) tablet 25 mg (25 mg Oral Given 07/15/24 1627)  baclofen  (LIORESAL ) tablet 10 mg (10 mg Oral Given 07/12/24 2230)  atenolol  (TENORMIN ) tablet 25 mg (25 mg Oral Given 07/15/24 1012)  atorvastatin  (LIPITOR) tablet 10 mg (10 mg Oral Given 07/15/24 1012)  cefUROXime  (CEFTIN ) tablet 250 mg (250 mg Oral Given 07/15/24 1012)  acetaminophen  (  TYLENOL ) tablet 1,000 mg (1,000 mg Oral Given 07/15/24 0040)  sodium chloride  0.9 % bolus 500 mL (0 mLs Intravenous Stopped 07/12/24 0256)  chlordiazePOXIDE  (LIBRIUM ) capsule 50 mg (50 mg Oral Given 07/12/24 1104)  sodium chloride  0.9 % bolus 1,000 mL (0 mLs Intravenous Stopped 07/15/24 1601)     ED Plan   Currently awaiting placement into an appropriate living facility.  Social work is working with the patient to help achieve this.      Dorothyann Drivers, MD 07/15/24 [place]

## 2024-07-15 NOTE — ED Notes (Signed)
 Fall risk bundle is currently in place.

## 2024-07-16 MED ORDER — ONDANSETRON 4 MG PO TBDP
4.0000 mg | ORAL_TABLET | Freq: Once | ORAL | Status: AC
  Administered 2024-07-16: 4 mg via ORAL
  Filled 2024-07-16: qty 1

## 2024-07-16 NOTE — ED Notes (Signed)
 Pt provided with lunch tray.

## 2024-07-16 NOTE — ED Notes (Signed)
 Assisted pt on to and off of bedpan per pts request. Pt unable to have bowel movement. Provided ginger ale per request and adjusted in bed for comfort. Pt states no other needs at this time.

## 2024-07-17 NOTE — ED Notes (Signed)
Pt resting comfortably with eyes closed. Respirations even and unlabored.

## 2024-07-17 NOTE — ED Notes (Signed)
 Pt to shower, cleaned pt, shampooed pt's hair, brushed pt's teeth, changed bedding, changed gown, dressing replaced on buttocks, no redness noted.  Pt walked back to stretcher with assistance.  Pt able to ambulate with some assistance, pt is unsteady

## 2024-07-17 NOTE — ED Provider Notes (Signed)
 Emergency Medicine Observation Re-evaluation Note  Douglas Peterson. is a 73 y.o. male, awaiting placement  Physical Exam  BP 131/76 (BP Location: Left Arm)   Pulse 62   Temp (!) 96.7 F (35.9 C) (Oral)   Resp 17   Ht 5' 4 (1.626 m)   Wt 52.6 kg   SpO2 93%   BMI 19.90 kg/m  Physical Exam General: calm   ED Course / MDM  No new labs in past 24 hours  Plan  Current plan is for placement.    Floy Roberts, MD 07/17/24 [phone]

## 2024-07-17 NOTE — ED Notes (Signed)
 Pt in bed with eyes open, pt states that he wants to go home, explained to pt that if he wanted to go home he needs to call someone to come get him.  Pt then states that he can't walk and can't go home, explained that his options were trying to go to [facility] or go home, pt states that he would like to go to [facility], meal tray given. Pt feeding self.

## 2024-07-17 NOTE — ED Notes (Signed)
 This NT ambulated pt to bathroom and back to bed. Pt walked well with assistance from this NT. Pt is now back in bed and PT is at bedside talking with pt.

## 2024-07-17 NOTE — ED Notes (Signed)
 Pt calling out to this RN and [employer] stating that he wants to go home. RN explained that pt is waiting to be accepted at [facility] so he can do rehab therapy. Pt states he wants to go there but doesn't understand that he can not go tonight.

## 2024-07-17 NOTE — ED Notes (Signed)
 Pt in bed, PT at bedside

## 2024-07-17 NOTE — ED Notes (Signed)
 Pt taken to restroom by this RN. Pt unable to use cane to walk to bathroom, pt taken in wheelchair. Pt able to have BM, cleaned by this RN and new underwear placed. Bed sheets changed and new blankets given. Pt returned to bed and repositioned. Pt has no other needs at this time.

## 2024-07-17 NOTE — ED Notes (Signed)
 Pt able to sit up in bed without assistance, helped pt out of bed, pt ambulatory aprox 15 feet down the hall with some assistance, pt is unsteady, but able to ambulate with moderate assistance.

## 2024-07-17 NOTE — ED Notes (Signed)
 Pt asked why he was here and could not go home. Pt stated that he lives at his house and wants to go home. Pt stated that he doesn't know why he is here and stated that he did not fall and said Look at me do you see any bruises? No? That's because I didn't fall.

## 2024-07-17 NOTE — ED Notes (Signed)
 Pt ambulatory up and down hallway, aprox 15 feet, pt ambulatory with minor assistance.

## 2024-07-18 NOTE — ED Notes (Signed)
 Pt provided hospital bed and new linens changed.

## 2024-07-18 NOTE — ED Notes (Signed)
 Pt moved from stretcher bed to hospital bed.

## 2024-07-18 NOTE — ED Notes (Signed)
 Pt had small bm, cleaned pt, changed soiled bedding, pt up and out of bed with some assistance, pt ambulatory down the hall aprox 20 fee with some assistance.

## 2024-07-18 NOTE — ED Notes (Signed)
Coffee and crackers given

## 2024-07-18 NOTE — ED Notes (Signed)
 This tech assisted pt. to the restroom at this time.

## 2024-07-18 NOTE — ED Provider Notes (Signed)
 No new lab studies performed today.  Vitals:   07/17/24 2315 07/18/24 1124  BP: 131/89 131/79  Pulse: 67 73  Resp: 17 16  Temp: 98.2 F (36.8 C) 97.9 F (36.6 C)  SpO2: 97% 96%      Patient resting comfortably.  He has no concerns at present.  Awake alert eating meal at this time  Appears currently working towards potential SNF placement at [facility].     Dicky Anes, MD 07/18/24 [phone]
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
## 2024-07-18 NOTE — ED Notes (Signed)
 Pt ambulated to toilet in room to urinate and have bowel movement. Pt had bowel movement on sheets. Sheets replaced with clean sheets.

## 2024-07-18 NOTE — TOC Progression Note (Addendum)
 Transition of Care (TOC) - Progression Note    Patient Details  Name: Douglas Peterson. MRN: [id number] Date of Birth: 1951-10-24  [facility] of [facility]) CM/SW Contact  Marinda Cooks, RN Phone Number: 07/18/2024, 1:31 PM  Clinical Narrative:    Per Chart review pt agreed to SNF placement at [facility] . This CM spoke with admission liaison Tammy and confirmed bed offer at [facility]. Ins Auth initiated. TOC will cont to follow dc planning / care coordination and update as applicable.    Expected Discharge Plan: Home/Self Care Barriers to Discharge: Continued Medical Work up     Expected Discharge Plan and Services In-house Referral: Clinical Social Work     Living arrangements for the past 2 months: Single Family Home                   Social Drivers of Health (SDOH) Interventions SDOH Screenings   Food Insecurity: No Food Insecurity (04/09/2024)  Housing: Low Risk  (04/09/2024)  Recent Concern: Housing - High Risk (01/27/2024)  Transportation Needs: Unmet Transportation Needs (04/09/2024)  Utilities: Not At Risk (04/09/2024)  Financial Resource Strain: Unknown (06/13/2019)  Physical Activity: Unknown (06/13/2019)  Social Connections: Socially Isolated (04/09/2024)  Stress: Unknown (06/13/2019)  Tobacco Use: Medium Risk (07/11/2024)    Readmission Risk Interventions    04/11/2024    5:11 PM 01/07/2024   11:31 AM 06/17/2023    4:23 PM  Readmission Risk Prevention Plan  Transportation Screening -- Complete Complete  HRI or Home Care Consult   Complete  Social Work Consult for Recovery Care Planning/Counseling   Complete  Palliative Care Screening   Not Applicable  Medication Review Oceanographer) -- Complete Complete  PCP or Specialist appointment within 3-5 days of discharge  Complete   HRI or Home Care Consult Patient refused Complete   SW Recovery Care/Counseling Consult Patient refused Complete   Palliative Care Screening Patient Refused Not Applicable   Skilled Nursing  Facility -- Not Applicable

## 2024-07-19 MED ORDER — ONDANSETRON HCL 4 MG PO TABS
4.0000 mg | ORAL_TABLET | Freq: Once | ORAL | Status: AC
  Administered 2024-07-19: 4 mg via ORAL
  Filled 2024-07-19: qty 1
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
## 2024-07-19 NOTE — ED Provider Notes (Signed)
 Emergency Medicine Observation Re-evaluation Note  Douglas Tse. is a 73 y.o. male, seen on rounds today.  Pt initially presented to the ED for complaints of Fall  Currently, the patient is resting in bed. No reported issues from nursing team.   Physical Exam  BP 112/74   Pulse 77   Temp 98.5 F (36.9 C) (Oral)   Resp 18   Ht 5' 4 (1.626 m)   Wt 52.6 kg   SpO2 94%   BMI 19.90 kg/m  Physical Exam General: Resting in bed  ED Course / MDM   No labs last 24 hours.  Plan  Current plan is for dispo per social work.    Levander Slate, MD 07/19/24 [phone]

## 2024-07-19 NOTE — ED Notes (Signed)
 [name] breakfast tray set up at this time as requested.

## 2024-07-19 NOTE — ED Notes (Signed)
 Patient ate approx 50% of lunch tray. No needs expressed at this time.

## 2024-07-20 NOTE — TOC CM/SW Note (Addendum)
..  Transition of Care [facility]) - Inpatient Brief Assessment   Patient Details  Name: Douglas Peterson. MRN: [id number] Date of Birth: January 27, 1951  Transition of Care [facility]) CM/SW Contact:    Edsel DELENA Fischer, LCSW Phone Number: 07/20/2024, 9:09 AM   Clinical Narrative:  Certified in Total Certification#: [id number]  TOC reached out to [facility].  [facility] is able to received pt today. Room 703. TOC notified staff . Room later changed to 710  Transition of Care Asessment:

## 2024-07-20 NOTE — ED Provider Notes (Addendum)
 Patient cleared for discharge to [facility]  I confirmed with facility and with nurse and does not need prescriptions.   Ernest Ronal BRAVO, MD 07/20/24 9075    Ernest Ronal BRAVO, MD 07/20/24 [phone]

## 2024-07-20 NOTE — ED Provider Notes (Signed)
 Emergency Medicine Observation Re-evaluation Note   BP 112/74   Pulse 77   Temp 98.5 F (36.9 C) (Oral)   Resp 18   Ht 5' 4 (1.626 m)   Wt 52.6 kg   SpO2 94%   BMI 19.90 kg/m    ED Course / MDM   No reported events during my shift at the time of this note.   Pt is awaiting dispo from consultants   Ginnie Shams MD    Shams Ginnie, MD 07/20/24 [phone]

## 2024-07-20 NOTE — ED Notes (Signed)
 Pt able to transfer x 1 assist from bed to wheelchair and from wheelchair to toilet.

## 2024-07-20 NOTE — ED Notes (Signed)
 Per Persina at [facility], pt does not need d/c prescriptions at this time.

## 2024-07-20 NOTE — ED Notes (Signed)
 [facility]  called  for  transport  to  [facility]

## 2024-07-20 NOTE — TOC CM/SW Note (Signed)
..  Transition of Care [facility]) - Inpatient Brief Assessment   Patient Details  Name: Douglas Peterson. MRN: [id number] Date of Birth: 1951-06-11  Transition of Care [facility]) CM/SW Contact:    Edsel DELENA Fischer, LCSW Phone Number: 07/20/2024, 9:32 AM   Clinical Narrative:  TOC received message from DSS The Department is not working with any of the adults listed  Transition of Care Asessment:

## 2024-07-20 NOTE — ED Notes (Signed)
 RN attempted to call report to [facility] to give report. RN attempted x3. Attempts unsuccessful.

## 2024-08-15 ENCOUNTER — Emergency Department

## 2024-08-15 ENCOUNTER — Observation Stay
Admission: EM | Admit: 2024-08-15 | Discharge: 2024-08-18 | Disposition: A | Discharge status: Home Health | Attending: Gastroenterology | Admitting: Gastroenterology

## 2024-08-15 ENCOUNTER — Other Ambulatory Visit: Payer: Self-pay

## 2024-08-15 DIAGNOSIS — D72829 Elevated white blood cell count, unspecified: Secondary | ICD-10-CM | POA: Insufficient documentation

## 2024-08-15 DIAGNOSIS — K529 Noninfective gastroenteritis and colitis, unspecified: Secondary | ICD-10-CM | POA: Insufficient documentation

## 2024-08-15 DIAGNOSIS — Z87891 Personal history of nicotine dependence: Secondary | ICD-10-CM | POA: Insufficient documentation

## 2024-08-15 DIAGNOSIS — K2100 Gastro-esophageal reflux disease with esophagitis, without bleeding: Secondary | ICD-10-CM | POA: Diagnosis not present

## 2024-08-15 DIAGNOSIS — E860 Dehydration: Secondary | ICD-10-CM | POA: Insufficient documentation

## 2024-08-15 DIAGNOSIS — F10220 Alcohol dependence with intoxication, uncomplicated: Secondary | ICD-10-CM | POA: Insufficient documentation

## 2024-08-15 DIAGNOSIS — I480 Paroxysmal atrial fibrillation: Secondary | ICD-10-CM | POA: Insufficient documentation

## 2024-08-15 DIAGNOSIS — R1310 Dysphagia, unspecified: Secondary | ICD-10-CM | POA: Diagnosis not present

## 2024-08-15 DIAGNOSIS — R490 Dysphonia: Secondary | ICD-10-CM | POA: Insufficient documentation

## 2024-08-15 DIAGNOSIS — Z79899 Other long term (current) drug therapy: Secondary | ICD-10-CM | POA: Insufficient documentation

## 2024-08-15 DIAGNOSIS — I10 Essential (primary) hypertension: Secondary | ICD-10-CM | POA: Diagnosis not present

## 2024-08-15 DIAGNOSIS — F10129 Alcohol abuse with intoxication, unspecified: Secondary | ICD-10-CM | POA: Insufficient documentation

## 2024-08-15 DIAGNOSIS — Y905 Blood alcohol level of 100-119 mg/100 ml: Secondary | ICD-10-CM | POA: Insufficient documentation

## 2024-08-15 DIAGNOSIS — K449 Diaphragmatic hernia without obstruction or gangrene: Secondary | ICD-10-CM | POA: Insufficient documentation

## 2024-08-15 DIAGNOSIS — K909 Intestinal malabsorption, unspecified: Secondary | ICD-10-CM | POA: Insufficient documentation

## 2024-08-15 DIAGNOSIS — E871 Hypo-osmolality and hyponatremia: Secondary | ICD-10-CM | POA: Insufficient documentation

## 2024-08-15 DIAGNOSIS — R1110 Vomiting, unspecified: Secondary | ICD-10-CM | POA: Diagnosis present

## 2024-08-15 DIAGNOSIS — F1020 Alcohol dependence, uncomplicated: Secondary | ICD-10-CM | POA: Diagnosis present

## 2024-08-15 DIAGNOSIS — R112 Nausea with vomiting, unspecified: Secondary | ICD-10-CM | POA: Diagnosis present

## 2024-08-15 DIAGNOSIS — D696 Thrombocytopenia, unspecified: Secondary | ICD-10-CM | POA: Diagnosis not present

## 2024-08-15 DIAGNOSIS — F10920 Alcohol use, unspecified with intoxication, uncomplicated: Principal | ICD-10-CM

## 2024-08-15 DIAGNOSIS — R531 Weakness: Secondary | ICD-10-CM

## 2024-08-15 DIAGNOSIS — M6281 Muscle weakness (generalized): Secondary | ICD-10-CM | POA: Insufficient documentation

## 2024-08-15 DIAGNOSIS — E079 Disorder of thyroid, unspecified: Secondary | ICD-10-CM | POA: Diagnosis not present

## 2024-08-15 DIAGNOSIS — R7989 Other specified abnormal findings of blood chemistry: Secondary | ICD-10-CM | POA: Insufficient documentation

## 2024-08-15 DIAGNOSIS — K209 Esophagitis, unspecified without bleeding: Secondary | ICD-10-CM

## 2024-08-15 LAB — CBC WITH DIFFERENTIAL/PLATELET
Abs Immature Granulocytes: 0.06 K/uL (ref 0.00–0.07)
Basophils Absolute: 0.1 K/uL (ref 0.0–0.1)
Basophils Relative: 0 %
Eosinophils Absolute: 0.1 K/uL (ref 0.0–0.5)
Eosinophils Relative: 0 %
HCT: 43.1 % (ref 39.0–52.0)
Hemoglobin: 14.4 g/dL (ref 13.0–17.0)
Immature Granulocytes: 0 %
Lymphocytes Relative: 55 %
Lymphs Abs: 10.2 K/uL — ABNORMAL HIGH (ref 0.7–4.0)
MCH: 31 pg (ref 26.0–34.0)
MCHC: 33.4 g/dL (ref 30.0–36.0)
MCV: 92.9 fL (ref 80.0–100.0)
Monocytes Absolute: 1.1 K/uL — ABNORMAL HIGH (ref 0.1–1.0)
Monocytes Relative: 6 %
Neutro Abs: 7.3 K/uL (ref 1.7–7.7)
Neutrophils Relative %: 39 %
Platelets: 194 K/uL (ref 150–400)
RBC: 4.64 MIL/uL (ref 4.22–5.81)
RDW: 12.6 % (ref 11.5–15.5)
Smear Review: NORMAL
WBC: 18.8 K/uL — ABNORMAL HIGH (ref 4.0–10.5)
nRBC: 0 % (ref 0.0–0.2)

## 2024-08-15 LAB — TROPONIN I (HIGH SENSITIVITY)
Troponin I (High Sensitivity): 5 ng/L (ref <18)
Troponin I (High Sensitivity): 5 ng/L (ref <18)

## 2024-08-15 LAB — COMPREHENSIVE METABOLIC PANEL WITH GFR
ALT: 27 U/L (ref 0–44)
AST: 39 U/L (ref 15–41)
Albumin: 4 g/dL (ref 3.5–5.0)
Alkaline Phosphatase: 73 U/L (ref 38–126)
Anion gap: 17 — ABNORMAL HIGH (ref 5–15)
BUN: 7 mg/dL — ABNORMAL LOW (ref 8–23)
CO2: 23 mmol/L (ref 22–32)
Calcium: 8.7 mg/dL — ABNORMAL LOW (ref 8.9–10.3)
Chloride: 90 mmol/L — ABNORMAL LOW (ref 98–111)
Creatinine, Ser: 0.36 mg/dL — ABNORMAL LOW (ref 0.61–1.24)
GFR, Estimated: >60 mL/min (ref >60)
Glucose, Bld: 93 mg/dL (ref 70–99)
Potassium: 3.6 mmol/L (ref 3.5–5.1)
Sodium: 130 mmol/L — ABNORMAL LOW (ref 135–145)
Total Bilirubin: 1.8 mg/dL — ABNORMAL HIGH (ref 0.0–1.2)
Total Protein: 7.5 g/dL (ref 6.5–8.1)

## 2024-08-15 LAB — URINALYSIS, ROUTINE W REFLEX MICROSCOPIC
Bilirubin Urine: NEGATIVE
Glucose, UA: NEGATIVE mg/dL
Hgb urine dipstick: NEGATIVE
Ketones, ur: NEGATIVE mg/dL
Leukocytes,Ua: NEGATIVE
Nitrite: NEGATIVE
Protein, ur: NEGATIVE mg/dL
Specific Gravity, Urine: 1.012 (ref 1.005–1.030)
pH: 7 (ref 5.0–8.0)

## 2024-08-15 LAB — PROTIME-INR
INR: 1 (ref 0.8–1.2)
Prothrombin Time: 14.2 s (ref 11.4–15.2)

## 2024-08-15 LAB — APTT: aPTT: 33 s (ref 24–36)

## 2024-08-15 LAB — ETHANOL: Alcohol, Ethyl (B): 117 mg/dL — ABNORMAL HIGH (ref <15)

## 2024-08-15 MED ORDER — METOCLOPRAMIDE HCL 5 MG/ML IJ SOLN
10.0000 mg | Freq: Once | INTRAMUSCULAR | Status: AC
Start: 2024-08-15 — End: 2024-08-15
  Administered 2024-08-15: 10 mg via INTRAVENOUS
  Filled 2024-08-15: qty 2

## 2024-08-15 MED ORDER — FOLIC ACID 1 MG PO TABS
1.0000 mg | ORAL_TABLET | Freq: Every day | ORAL | Status: DC
  Administered 2024-08-16 – 2024-08-18 (×3): 1 mg via ORAL
  Filled 2024-08-15 (×3): qty 1

## 2024-08-15 MED ORDER — SODIUM CHLORIDE 0.9 % IV BOLUS
1000.0000 mL | Freq: Once | INTRAVENOUS | Status: AC
  Administered 2024-08-15: 1000 mL via INTRAVENOUS

## 2024-08-15 MED ORDER — IOHEXOL 300 MG/ML  SOLN
80.0000 mL | Freq: Once | INTRAMUSCULAR | Status: AC | PRN
  Administered 2024-08-15: 80 mL via INTRAVENOUS

## 2024-08-15 MED ORDER — THIAMINE MONONITRATE 100 MG PO TABS
100.0000 mg | ORAL_TABLET | Freq: Every day | ORAL | Status: DC
  Administered 2024-08-16 – 2024-08-18 (×3): 100 mg via ORAL
  Filled 2024-08-15 (×3): qty 1

## 2024-08-15 MED ORDER — ADULT MULTIVITAMIN W/MINERALS CH
1.0000 | ORAL_TABLET | Freq: Every day | ORAL | Status: DC
  Administered 2024-08-17 – 2024-08-18 (×2): 1 via ORAL
  Filled 2024-08-15 (×3): qty 1

## 2024-08-15 MED ORDER — ONDANSETRON HCL 4 MG/2ML IJ SOLN
4.0000 mg | Freq: Once | INTRAMUSCULAR | Status: AC
  Administered 2024-08-15: 4 mg via INTRAVENOUS
  Filled 2024-08-15: qty 2

## 2024-08-15 MED ORDER — LORAZEPAM 2 MG/ML IJ SOLN: 1.0000 mg | INTRAMUSCULAR | Status: DC | PRN

## 2024-08-15 MED ORDER — PANTOPRAZOLE SODIUM 40 MG IV SOLR
40.0000 mg | Freq: Once | INTRAVENOUS | Status: AC
Start: 2024-08-15 — End: 2024-08-15
  Administered 2024-08-15: 40 mg via INTRAVENOUS
  Filled 2024-08-15: qty 10

## 2024-08-15 MED ORDER — ACETAMINOPHEN 500 MG PO TABS
1000.0000 mg | ORAL_TABLET | Freq: Once | ORAL | Status: AC
  Administered 2024-08-15: 1000 mg via ORAL
  Filled 2024-08-15: qty 2

## 2024-08-15 MED ORDER — LIDOCAINE VISCOUS HCL 2 % MT SOLN
15.0000 mL | Freq: Once | OROMUCOSAL | Status: AC
  Administered 2024-08-15: 15 mL via OROMUCOSAL
  Filled 2024-08-15: qty 15

## 2024-08-15 MED ORDER — THIAMINE HCL 100 MG/ML IJ SOLN: 100.0000 mg | Freq: Every day | INTRAMUSCULAR | Status: DC

## 2024-08-15 MED ORDER — LORAZEPAM 1 MG PO TABS: 1.0000 mg | ORAL_TABLET | ORAL | Status: DC | PRN

## 2024-08-15 NOTE — ED Notes (Signed)
 This EDT assist pt to use urinal.

## 2024-08-15 NOTE — ED Provider Notes (Signed)
 [facility] Provider Note    Event Date/Time   First MD Initiated Contact with Patient 08/15/24 1716     (approximate)   History   Emesis   HPI  Douglas Peterson. is a 73 y.o. male with history of alcohol abuse, alcoholic cirrhosis, paroxysmal A-fib who comes in with concerns for vomiting.  Patient on review of records was last discharged from the hospital on 04/15/2024.  He was admitted for detox.  At that time they noted no ascites and his cirrhosis was considered compensated.  He does have poor medication adherence however.  He then was in the emergency room again and was boarding for placement and eventually was placed at [facility] on 9/17.  He comes in today from there due to concerns for vomiting since yesterday decreased appetite for couple of days.  He reports that because he has vomited so much she has developed a hoarseness to his voice but denies any throat pain prior to the starting.  He does report continuing to drink alcohol and reports 4 beers today.  He denies any obvious chest pain, shortness of breath.   Physical Exam   Triage Vital Signs: ED Triage Vitals [08/15/24 1715]  Encounter Vitals Group     BP (!) 150/127     Girls Systolic BP Percentile      Girls Diastolic BP Percentile      Boys Systolic BP Percentile      Boys Diastolic BP Percentile      Pulse Rate 100     Resp (!) 21     Temp 98.1 F (36.7 C)     Temp Source Oral     SpO2 99 %     Weight      Height      Head Circumference      [facility] Flow      Pain Score 10     Pain Loc      Pain Education      Exclude from Growth Chart     Most recent vital signs: Vitals:   08/15/24 1715  BP: (!) 150/127  Pulse: 100  Resp: (!) 21  Temp: 98.1 F (36.7 C)  SpO2: 99%     General: Awake, no distress.  CV:  Good peripheral perfusion.  Resp:  Normal effort.  Abd:  No distention.  Some tenderness noted in the abdomen. Other:  No swelling in legs Patient does have a  hoarse voice however oropharynx appears normal.  Uvula is midline.  ED Results / Procedures / Treatments   Labs (all labs ordered are listed, but only abnormal results are displayed) Labs Reviewed  CBC WITH DIFFERENTIAL/PLATELET - Abnormal; Notable for the following components:      Result Value   WBC 18.8 (*)    Lymphs Abs 10.2 (*)    Monocytes Absolute 1.1 (*)    All other components within normal limits  COMPREHENSIVE METABOLIC PANEL WITH GFR - Abnormal; Notable for the following components:   Sodium 130 (*)    Chloride 90 (*)    BUN 7 (*)    Creatinine, Ser 0.36 (*)    Calcium  8.7 (*)    Total Bilirubin 1.8 (*)    Anion gap 17 (*)    All other components within normal limits  ETHANOL - Abnormal; Notable for the following components:   Alcohol, Ethyl (B) 117 (*)    All other components within normal limits  PROTIME-INR  APTT  LIPASE,  BLOOD  TROPONIN I (HIGH SENSITIVITY)  TROPONIN I (HIGH SENSITIVITY)     EKG  My interpretation of EKG:  Sinus rate of 89 without any ST elevation or T wave inversions, normal intervals  RADIOLOGY I have reviewed the ct personally and interpreted no ich   PROCEDURES:  Critical Care performed: No  .1-3 Lead EKG Interpretation  Performed by: Ernest Ronal BRAVO, MD Authorized by: Ernest Ronal BRAVO, MD     Interpretation: normal     ECG rate:  90   ECG rate assessment: normal     Rhythm: sinus rhythm     Ectopy: none     Conduction: normal      MEDICATIONS ORDERED IN ED: Medications  sodium chloride  0.9 % bolus 1,000 mL (1,000 mLs Intravenous New Bag/Given 08/15/24 1913)  ondansetron  (ZOFRAN ) injection 4 mg (4 mg Intravenous Given 08/15/24 1912)  iohexol  (OMNIPAQUE ) 300 MG/ML solution 80 mL (80 mLs Intravenous Contrast Given 08/15/24 2021)     IMPRESSION / MDM / ASSESSMENT AND PLAN / ED COURSE  I reviewed the triage vital signs and the nursing notes.   Patient's presentation is most consistent with acute presentation with  potential threat to life or bodily function.   Patient comes in with nausea, vomiting, abdominal pain in the setting of known alcohol abuse.  Will get workup to evaluate for potential causes.  He denies any known falls but is not the best historian so get a CT head just to ensure there is no intracranial mass or intracranial hemorrhage that could be attributing to vomiting as well as get a CT abdomen to ensure no acute abdominal pathology.  Troponins are negative x 2.  CBC does show elevated white count with slight elevation of lymphocytes.  CMP does show slightly low sodium of 130 and low chloride of 90 and patient is getting some IV fluids for this..  Alcohol level was elevated at 117.  IMPRESSION: 1. No acute findings in the abdomen or pelvis. 2. Hepatic steatosis. 3. Aortic atherosclerosis. 4. Moderate stool burden in the rectosigmoid colon.    We discussed doing strep testing given his coarse voice but he states that it is just because of all the vomiting and the acid on it he declined strep testing which I think is reasonable given uvula midline no pus noted given patient's age this would seem less likely.  He is at full range of motion of neck low suspicion for acute issue here.   Will give patient some additional medications to help with symptoms and do p.o. challenge  10:27 PM patient attempted to ambulate to the bathroom and very wobbly.  He attempted to take some p.o. but took 1 step and continued to feel very nauseous.  He did not vomit just reports feeling nauseous.  He would be going back home as he was recently taken out of the facility due to insurance not covering it.  Given inability to tolerate p.o. and difficulties with ambulation as well as repeat blood pressure was low in the 90s will give additional liter of fluid and discussed the hospital team for admission for monitoring.  The patient is on the cardiac monitor to evaluate for evidence of arrhythmia and/or significant  heart rate changes.      FINAL CLINICAL IMPRESSION(S) / ED DIAGNOSES   Final diagnoses:  Alcoholic intoxication without complication  Nausea and vomiting, unspecified vomiting type  Dehydration  Hyponatremia  Weakness     Rx / DC Orders   ED  Discharge Orders     None        Note:  This document was prepared using Dragon voice recognition software and may include unintentional dictation errors.   Ernest Ronal BRAVO, MD 08/15/24 2228

## 2024-08-15 NOTE — ED Triage Notes (Signed)
 Pt from [facility] BIB [facility]. Pt had vomiting since yesterday and decreased appetite for a couple days. Pt c/o of throat and ABD pain. [name] last drink was 3 hrs ago. Reports he had 4 beers today. EMS vitals: 98.1 F BP 144/71 100% RA 78 HR

## 2024-08-15 NOTE — ED Notes (Signed)
 Provided beverage for PO challenge

## 2024-08-16 ENCOUNTER — Encounter
Admission: EM | Disposition: A | Discharge status: Home Health | Payer: Self-pay | Source: Home / Self Care | Attending: Emergency Medicine

## 2024-08-16 ENCOUNTER — Observation Stay

## 2024-08-16 ENCOUNTER — Observation Stay: Admitting: Anesthesiology

## 2024-08-16 ENCOUNTER — Encounter: Payer: Self-pay | Admitting: Internal Medicine

## 2024-08-16 DIAGNOSIS — K209 Esophagitis, unspecified without bleeding: Secondary | ICD-10-CM

## 2024-08-16 DIAGNOSIS — K449 Diaphragmatic hernia without obstruction or gangrene: Secondary | ICD-10-CM

## 2024-08-16 DIAGNOSIS — F10920 Alcohol use, unspecified with intoxication, uncomplicated: Secondary | ICD-10-CM

## 2024-08-16 DIAGNOSIS — F1010 Alcohol abuse, uncomplicated: Secondary | ICD-10-CM

## 2024-08-16 DIAGNOSIS — K760 Fatty (change of) liver, not elsewhere classified: Secondary | ICD-10-CM | POA: Diagnosis not present

## 2024-08-16 DIAGNOSIS — R112 Nausea with vomiting, unspecified: Secondary | ICD-10-CM | POA: Diagnosis not present

## 2024-08-16 DIAGNOSIS — K2100 Gastro-esophageal reflux disease with esophagitis, without bleeding: Principal | ICD-10-CM

## 2024-08-16 HISTORY — PX: ESOPHAGOGASTRODUODENOSCOPY: SHX5428

## 2024-08-16 LAB — COMPREHENSIVE METABOLIC PANEL WITH GFR
ALT: 21 U/L (ref 0–44)
AST: 27 U/L (ref 15–41)
Albumin: 3.6 g/dL (ref 3.5–5.0)
Alkaline Phosphatase: 60 U/L (ref 38–126)
Anion gap: 10 (ref 5–15)
BUN: 6 mg/dL — ABNORMAL LOW (ref 8–23)
CO2: 25 mmol/L (ref 22–32)
Calcium: 8.3 mg/dL — ABNORMAL LOW (ref 8.9–10.3)
Chloride: 101 mmol/L (ref 98–111)
Creatinine, Ser: 0.51 mg/dL — ABNORMAL LOW (ref 0.61–1.24)
GFR, Estimated: >60 mL/min (ref >60)
Glucose, Bld: 78 mg/dL (ref 70–99)
Potassium: 3.6 mmol/L (ref 3.5–5.1)
Sodium: 136 mmol/L (ref 135–145)
Total Bilirubin: 2.2 mg/dL — ABNORMAL HIGH (ref 0.0–1.2)
Total Protein: 6.4 g/dL — ABNORMAL LOW (ref 6.5–8.1)

## 2024-08-16 LAB — CBC
HCT: 35.7 % — ABNORMAL LOW (ref 39.0–52.0)
Hemoglobin: 12.3 g/dL — ABNORMAL LOW (ref 13.0–17.0)
MCH: 31.8 pg (ref 26.0–34.0)
MCHC: 34.5 g/dL (ref 30.0–36.0)
MCV: 92.2 fL (ref 80.0–100.0)
Platelets: 128 K/uL — ABNORMAL LOW (ref 150–400)
RBC: 3.87 MIL/uL — ABNORMAL LOW (ref 4.22–5.81)
RDW: 12.7 % (ref 11.5–15.5)
WBC: 9 K/uL (ref 4.0–10.5)
nRBC: 0 % (ref 0.0–0.2)

## 2024-08-16 SURGERY — EGD (ESOPHAGOGASTRODUODENOSCOPY)
Anesthesia: General

## 2024-08-16 MED ORDER — LIDOCAINE HCL (CARDIAC) PF 100 MG/5ML IV SOSY
PREFILLED_SYRINGE | INTRAVENOUS | Status: DC | PRN
  Administered 2024-08-16: 60 mg via INTRAVENOUS

## 2024-08-16 MED ORDER — PANTOPRAZOLE SODIUM 40 MG PO TBEC
40.0000 mg | DELAYED_RELEASE_TABLET | Freq: Two times a day (BID) | ORAL | Status: DC
  Administered 2024-08-16 – 2024-08-18 (×5): 40 mg via ORAL
  Filled 2024-08-16 (×5): qty 1

## 2024-08-16 MED ORDER — ONDANSETRON HCL 4 MG/2ML IJ SOLN: 4.0000 mg | Freq: Four times a day (QID) | INTRAMUSCULAR | Status: DC | PRN

## 2024-08-16 MED ORDER — SODIUM CHLORIDE 0.9 % IV SOLN: INTRAVENOUS | Status: DC

## 2024-08-16 MED ORDER — PROPOFOL 10 MG/ML IV BOLUS
INTRAVENOUS | Status: DC | PRN
  Administered 2024-08-16: 30 mg via INTRAVENOUS
  Administered 2024-08-16: 40 mg via INTRAVENOUS

## 2024-08-16 MED ORDER — LACTATED RINGERS IV SOLN: INTRAVENOUS | Status: AC

## 2024-08-16 MED ORDER — DEXMEDETOMIDINE HCL IN NACL 80 MCG/20ML IV SOLN
INTRAVENOUS | Status: DC | PRN
  Administered 2024-08-16: 4 ug via INTRAVENOUS
  Administered 2024-08-16: 8 ug via INTRAVENOUS

## 2024-08-16 MED ORDER — ENOXAPARIN SODIUM 40 MG/0.4ML IJ SOSY: 40.0000 mg | PREFILLED_SYRINGE | INTRAMUSCULAR | Status: DC

## 2024-08-16 MED ORDER — ACETAMINOPHEN 325 MG PO TABS: 650.0000 mg | ORAL_TABLET | Freq: Four times a day (QID) | ORAL | Status: DC | PRN

## 2024-08-16 MED ORDER — ENOXAPARIN SODIUM 40 MG/0.4ML IJ SOSY
40.0000 mg | PREFILLED_SYRINGE | INTRAMUSCULAR | Status: DC
  Administered 2024-08-17 – 2024-08-18 (×2): 40 mg via SUBCUTANEOUS
  Filled 2024-08-16 (×2): qty 0.4

## 2024-08-16 MED ORDER — HYDROCODONE-ACETAMINOPHEN 5-325 MG PO TABS: 1.0000 | ORAL_TABLET | ORAL | Status: DC | PRN

## 2024-08-16 MED ORDER — ONDANSETRON HCL 4 MG PO TABS: 4.0000 mg | ORAL_TABLET | Freq: Four times a day (QID) | ORAL | Status: DC | PRN

## 2024-08-16 MED ORDER — ACETAMINOPHEN 650 MG RE SUPP: 650.0000 mg | Freq: Four times a day (QID) | RECTAL | Status: DC | PRN
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
## 2024-08-16 NOTE — ED Notes (Signed)
 Pt calling on call bell.

## 2024-08-16 NOTE — ED Notes (Signed)
Gastroenterologist at bedside.
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
## 2024-08-16 NOTE — Progress Notes (Signed)
 SLP Cancellation Note  Patient Details Name: Douglas Peterson. MRN: [id number] DOB: 03-19-1951   Cancelled treatment:       Reason Eval/Treat Not Completed: Other (comment)  ST services received consult. Pt is known to our services - per chart he has had 7 instrumental swallow studies with recommendations made for NPO d/t severity of oropharyngeal dysphagia and silent aspiration. At this time, it is outside of ST abilities to be impactful.   [name] B. Rubbie, M.S., CCC-SLP, CBIS Speech-Language Pathologist Certified Brain Injury Specialist [facility]  [facility] [phone] Ascom [phone] Fax [phone]  Claudetta Rubbie 08/16/2024, 9:26 AM

## 2024-08-16 NOTE — Anesthesia Postprocedure Evaluation (Signed)
 Anesthesia Post Note  Patient: Douglas Peterson.  Procedure(s) Performed: EGD (ESOPHAGOGASTRODUODENOSCOPY)  Patient location during evaluation: PACU Anesthesia Type: General Level of consciousness: awake and alert Pain management: pain level controlled Vital Signs Assessment: post-procedure vital signs reviewed and stable Respiratory status: spontaneous breathing, nonlabored ventilation and respiratory function stable Cardiovascular status: blood pressure returned to baseline and stable Postop Assessment: no apparent nausea or vomiting Anesthetic complications: no   No notable events documented.   Last Vitals:  Vitals:   08/16/24 1553 08/16/24 1614  BP: 117/72 131/76  Pulse: 78 74  Resp: (!) 24 16  Temp:  36.6 C  SpO2: 95% 95%    Last Pain:  Vitals:   08/16/24 1614  TempSrc: Oral  PainSc:                  Camellia Merilee Louder

## 2024-08-16 NOTE — ED Notes (Signed)
 This RN gave pt report to [place], Charity fundraiser

## 2024-08-16 NOTE — ED Notes (Signed)
 Staff from Endoscopy is here for transport.

## 2024-08-16 NOTE — Op Note (Signed)
 [facility] Gastroenterology Patient Name: Douglas Peterson Procedure Date: 08/16/2024 3:20 PM MRN: [id number] Account #: [id number] Date of Birth: 08-Nov-1950 Admit Type: Inpatient Age: 73 Room: [facility] ENDO ROOM 2 Gender: Male Note Status: Finalized Instrument Name: Upper GI Scope [id number] Procedure:             Upper GI endoscopy Indications:           Nausea with vomiting Providers:             Rogelia Copping MD, MD Referring MD:          No Local Md, MD (Referring MD) Medicines:             Propofol  per Anesthesia Complications:         No immediate complications. Procedure:             Pre-Anesthesia Assessment:                        - Prior to the procedure, a History and Physical was                         performed, and patient medications and allergies were                         reviewed. The patient's tolerance of previous                         anesthesia was also reviewed. The risks and benefits                         of the procedure and the sedation options and risks                         were discussed with the patient. All questions were                         answered, and informed consent was obtained. Prior                         Anticoagulants: The patient has taken no anticoagulant                         or antiplatelet agents. ASA Grade Assessment: II - A                         patient with mild systemic disease. After reviewing                         the risks and benefits, the patient was deemed in                         satisfactory condition to undergo the procedure.                        After obtaining informed consent, the endoscope was                         passed under direct vision. Throughout the procedure,  the patient's blood pressure, pulse, and oxygen                         saturations were monitored continuously. The Endoscope                         was introduced through the mouth, and  advanced to the                         second part of duodenum. The upper GI endoscopy was                         accomplished without difficulty. The patient tolerated                         the procedure well. Findings:      LA Grade C (one or more mucosal breaks continuous between tops of 2 or       more mucosal folds, less than 75% circumference) esophagitis with no       bleeding was found in the distal esophagus.      A small hiatal hernia was present.      The stomach was normal.      The examined duodenum was normal. Impression:            - LA Grade C reflux esophagitis with no bleeding.                        - Small hiatal hernia.                        - Normal stomach.                        - Normal examined duodenum.                        - No specimens collected. Recommendation:        - Return patient to hospital ward for ongoing care.                        - Clear liquid diet.                        - Continue present medications. Procedure Code(s):     --- Professional ---                        [phone], Esophagogastroduodenoscopy, flexible,                         transoral; diagnostic, including collection of                         specimen(s) by brushing or washing, when performed                         (separate procedure) Diagnosis Code(s):     --- Professional ---                        R11.2, Nausea with vomiting, unspecified  K21.00, Gastro-esophageal reflux disease with                         esophagitis, without bleeding CPT copyright 2022 American Medical Association. All rights reserved. The codes documented in this report are preliminary and upon coder review may  be revised to meet current compliance requirements. Rogelia Copping MD, MD 08/16/2024 3:30:43 PM This report has been signed electronically. Number of Addenda: 0 Note Initiated On: 08/16/2024 3:20 PM Estimated Blood Loss:  Estimated blood loss: none.      [facility]
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
## 2024-08-16 NOTE — ED Notes (Signed)
 Patient changed into a gown, shorts and shirts and shoes are in a belongings bag. Patient has duffel bag at bedside.

## 2024-08-16 NOTE — ED Notes (Signed)
 Pt NPO since 12pm yesterday (pepsi). No solid food since Friday. Per Dr Jinny, sips with meds ok.

## 2024-08-16 NOTE — ED Notes (Signed)
 Patient transported to CT

## 2024-08-16 NOTE — Transfer of Care (Signed)
 Immediate Anesthesia Transfer of Care Note  Patient: Douglas Peterson.  Procedure(s) Performed: EGD (ESOPHAGOGASTRODUODENOSCOPY)  Patient Location: PACU and Endoscopy Unit  Anesthesia Type:General  Level of Consciousness: awake, drowsy, and patient cooperative  Airway & Oxygen Therapy: Patient Spontanous Breathing  Post-op Assessment: Report given to RN and Post -op Vital signs reviewed and stable  Post vital signs: Reviewed and stable  Last Vitals:  Vitals Value Taken Time  BP 99/69 08/16/24 15:33  Temp    Pulse 85 08/16/24 15:33  Resp 20 08/16/24 15:33  SpO2 93 % 08/16/24 15:33  Vitals shown include unfiled device data.  Last Pain:  Vitals:   08/16/24 1438  TempSrc: Temporal  PainSc:          Complications: No notable events documented.
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
## 2024-08-16 NOTE — Progress Notes (Signed)
 The patient had an EGD with esophagitis and hiatal hernia seen.  The rest of the exam was unremarkable.  The patient has vomiting likely due to his alcohol abuse and reflux although a central cause could be responsible with his history of significant alcohol abuse.  The patient should be treated with a PPI.  Nothing further to to offer from a GI point of view.  I will sign off.  Please call if any further GI concerns or questions.  We would like to thank you for the opportunity to participate in the care of Douglas PetersonSABRA

## 2024-08-16 NOTE — ED Notes (Signed)
 [name] aware of EGD being schedule for 1515 today per the [facility].

## 2024-08-16 NOTE — ED Notes (Signed)
 Per pt request, reached out to pharmacy re: other home meds. Baclofen  and others.

## 2024-08-17 DIAGNOSIS — K209 Esophagitis, unspecified without bleeding: Secondary | ICD-10-CM | POA: Diagnosis not present

## 2024-08-17 DIAGNOSIS — F10288 Alcohol dependence with other alcohol-induced disorder: Secondary | ICD-10-CM

## 2024-08-17 DIAGNOSIS — R112 Nausea with vomiting, unspecified: Secondary | ICD-10-CM | POA: Diagnosis not present

## 2024-08-17 LAB — COMPREHENSIVE METABOLIC PANEL WITH GFR
ALT: 18 U/L (ref 0–44)
AST: 28 U/L (ref 15–41)
Albumin: 3.4 g/dL — ABNORMAL LOW (ref 3.5–5.0)
Alkaline Phosphatase: 58 U/L (ref 38–126)
Anion gap: 15 (ref 5–15)
BUN: 6 mg/dL — ABNORMAL LOW (ref 8–23)
CO2: 23 mmol/L (ref 22–32)
Calcium: 8.5 mg/dL — ABNORMAL LOW (ref 8.9–10.3)
Chloride: 98 mmol/L (ref 98–111)
Creatinine, Ser: 0.57 mg/dL — ABNORMAL LOW (ref 0.61–1.24)
GFR, Estimated: >60 mL/min (ref >60)
Glucose, Bld: 78 mg/dL (ref 70–99)
Potassium: 3.6 mmol/L (ref 3.5–5.1)
Sodium: 136 mmol/L (ref 135–145)
Total Bilirubin: 2.1 mg/dL — ABNORMAL HIGH (ref 0.0–1.2)
Total Protein: 6.8 g/dL (ref 6.5–8.1)

## 2024-08-17 LAB — MAGNESIUM: Magnesium: 1.8 mg/dL (ref 1.7–2.4)

## 2024-08-17 LAB — CBC
HCT: 38 % — ABNORMAL LOW (ref 39.0–52.0)
Hemoglobin: 13.1 g/dL (ref 13.0–17.0)
MCH: 31.8 pg (ref 26.0–34.0)
MCHC: 34.5 g/dL (ref 30.0–36.0)
MCV: 92.2 fL (ref 80.0–100.0)
Platelets: 106 K/uL — ABNORMAL LOW (ref 150–400)
RBC: 4.12 MIL/uL — ABNORMAL LOW (ref 4.22–5.81)
RDW: 12 % (ref 11.5–15.5)
WBC: 7.1 K/uL (ref 4.0–10.5)
nRBC: 0 % (ref 0.0–0.2)

## 2024-08-17 LAB — LIPASE, BLOOD: Lipase: 21 U/L (ref 11–51)

## 2024-08-17 MED ORDER — ATORVASTATIN CALCIUM 10 MG PO TABS
10.0000 mg | ORAL_TABLET | Freq: Every day | ORAL | Status: DC
  Administered 2024-08-17 – 2024-08-18 (×2): 10 mg via ORAL
  Filled 2024-08-17 (×2): qty 1

## 2024-08-17 MED ORDER — ATENOLOL 25 MG PO TABS
25.0000 mg | ORAL_TABLET | Freq: Every day | ORAL | Status: DC
Start: 2024-08-17 — End: 2024-08-18
  Administered 2024-08-17 – 2024-08-18 (×2): 25 mg via ORAL
  Filled 2024-08-17 (×2): qty 1

## 2024-08-17 MED ORDER — VENLAFAXINE HCL 25 MG PO TABS
25.0000 mg | ORAL_TABLET | Freq: Two times a day (BID) | ORAL | Status: DC
  Administered 2024-08-17 – 2024-08-18 (×2): 25 mg via ORAL
  Filled 2024-08-17 (×4): qty 1

## 2024-08-17 MED ORDER — ENSURE PLUS HIGH PROTEIN PO LIQD
237.0000 mL | Freq: Two times a day (BID) | ORAL | Status: DC
  Administered 2024-08-17 – 2024-08-18 (×2): 237 mL via ORAL

## 2024-08-17 MED ORDER — PANCRELIPASE (LIP-PROT-AMYL) 12000-38000 UNITS PO CPEP
24000.0000 [IU] | ORAL_CAPSULE | Freq: Three times a day (TID) | ORAL | Status: DC
  Administered 2024-08-17 – 2024-08-18 (×3): 24000 [IU] via ORAL
  Filled 2024-08-17 (×3): qty 2

## 2024-08-17 MED ORDER — MELATONIN 5 MG PO TABS
5.0000 mg | ORAL_TABLET | Freq: Every day | ORAL | Status: DC
  Administered 2024-08-17: 5 mg via ORAL
  Filled 2024-08-17: qty 1

## 2024-08-17 MED ORDER — SUCRALFATE 1 G PO TABS
1.0000 g | ORAL_TABLET | Freq: Three times a day (TID) | ORAL | Status: DC
  Administered 2024-08-17 – 2024-08-18 (×4): 1 g via ORAL
  Filled 2024-08-17 (×4): qty 1

## 2024-08-17 MED ORDER — BACLOFEN 10 MG PO TABS
5.0000 mg | ORAL_TABLET | Freq: Three times a day (TID) | ORAL | Status: DC
  Administered 2024-08-17 – 2024-08-18 (×4): 5 mg via ORAL
  Filled 2024-08-17 (×4): qty 1

## 2024-08-17 NOTE — Care Management Obs Status (Signed)
 MEDICARE OBSERVATION STATUS NOTIFICATION   Patient Details  Name: Douglas Peterson. MRN: [id number] Date of Birth: 1951/07/06   Medicare Observation Status Notification Given:  Yes    Rojelio SHAUNNA Rattler 08/17/2024, 10:09 AM

## 2024-08-17 NOTE — TOC Initial Note (Signed)
 Transition of Care (TOC) - Initial/Assessment Note    Patient Details  Name: Douglas Peterson. MRN: [id number] Date of Birth: 04-24-1951  Transition of Care [facility] CM/SW Contact:    Corean ONEIDA Haddock, RN Phone Number: 08/17/2024, 3:47 PM  Clinical Narrative:                  Met with patient at bedside Patient discharged from 10/10. And returned home Patient confirms he continues to drink.  States he knows he has to do better and is going to change this time Substance abuse resources and social isolation resources added to AVS   PT OT eval pending  Patient states that he will be going home at discharge and would like to use [facility] home health.  Referral made and accepted by [place] with [facility]  Patient states that his neighbor will be transporting at discharge Patient states that he has a RW, cane, and WC in the home      Patient Goals and CMS Choice            Expected Discharge Plan and Services                                              Prior Living Arrangements/Services                       Activities of Daily Living   ADL Screening (condition at time of admission) Independently performs ADLs?: No Does the patient have a NEW difficulty with bathing/dressing/toileting/self-feeding that is expected to last >3 days?: No Does the patient have a NEW difficulty with getting in/out of bed, walking, or climbing stairs that is expected to last >3 days?: No Does the patient have a NEW difficulty with communication that is expected to last >3 days?: No Is the patient deaf or have difficulty hearing?: No Does the patient have difficulty seeing, even when wearing glasses/contacts?: No Does the patient have difficulty concentrating, remembering, or making decisions?: No  Permission Sought/Granted                  Emotional Assessment              Admission diagnosis:  Dehydration [E86.0] Hyponatremia [E87.1] Weakness  [R53.1] Intractable nausea and vomiting [R11.2] Alcoholic intoxication without complication [F10.920] Nausea and vomiting, unspecified vomiting type [R11.2] Patient Active Problem List   Diagnosis Date Noted   Acute esophagitis 08/16/2024   Elevated troponin 08/15/2024   Head injury 03/25/2024   Alcohol withdrawal (HCC) 03/19/2024   Incarcerated hernia 01/27/2024   S/P laparoscopic hernia repair 01/27/2024   Intractable nausea and vomiting 10/06/2023   Protein-calorie malnutrition, severe 08/20/2023   Ground-level fall 08/16/2023   GERD without esophagitis 08/06/2023   Nausea 07/19/2023   Pressure injury of skin 07/11/2023   Alcohol abuse 07/11/2023   Abdominal pain 06/16/2023   Elevated lactic acid level 06/16/2023   Chronic diastolic CHF (congestive heart failure) (HCC) 06/16/2023   Increased urinary frequency 06/08/2023   Diarrhea 06/08/2023   Upper respiratory infection 06/04/2023   Enteritis 06/02/2023   Polyuria 06/02/2023   Dyslipidemia 05/01/2023   Alcohol dependence (HCC) 05/01/2023   Maggot infestation feet 05/11/2022   Unable to care for self 05/11/2022   Debility 04/25/2022   Weakness 04/23/2022   Epistaxis 04/23/2022   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  Generalized weakness 03/18/2022   Nondisplaced fracture of shaft of left clavicle, initial encounter for closed fracture 03/18/2022   BPH (benign prostatic hyperplasia) 03/18/2022   Clavicle fracture 03/17/2022   Fall 03/17/2022   CAP (community acquired pneumonia) 03/13/2022   Altered mental status, unspecified 02/20/2022   Hypotension 02/19/2022   Dysphonia 08/13/2021   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   Paresthesia of right upper extremity 08/13/2021   SIRS (systemic inflammatory response syndrome) (HCC) 08/05/2020   GERD (gastroesophageal reflux disease) 06/05/2020   Alcohol use disorder, severe, dependence (HCC) 05/28/2020   Elevated LFTs 04/06/2020   Paroxysmal A-fib (HCC) 03/17/2020    Primary insomnia 02/06/2020   Left inguinal hernia 01/31/2020   Encounter for competency evaluation    Depression 01/17/2020   Pancytopenia (HCC)    Alcoholic cirrhosis of liver without ascites (HCC)    ETOH abuse    Alcohol intoxication    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) 09/27/2019   Hypokalemia 09/27/2019   Dysphagia 09/27/2019   Alcohol withdrawal delirium, acute, hyperactive (HCC) 09/21/2019   Pressure injury of ankle, stage 1 08/15/2019   Palliative care by specialist    Closed fracture of right proximal humerus 03/19/2019   Vitamin D  deficiency 01/11/2019   Osteoporosis 12/22/2018   Closed nondisplaced fracture of acromial process of right scapula with routine healing 11/15/2018   Compression fracture of L2 vertebra with routine healing 11/15/2018   Delirium tremens (HCC) 03/08/2018   Essential hypertension 09/16/2017   Encephalopathy, 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 [place] [address] Phone: [phone] Fax: [phone]     Social Drivers of Health (SDOH) Social History: SDOH Screenings   Food Insecurity: No Food Insecurity (08/16/2024)  Housing: Low Risk  (08/16/2024)  Transportation Needs: No Transportation Needs (08/16/2024)  Utilities: Not At Risk (08/16/2024)  Financial Resource Strain: Unknown (06/13/2019)  Physical Activity: Unknown (06/13/2019)  Social Connections: Socially Isolated (08/16/2024)  Stress: Unknown (06/13/2019)  Tobacco Use: Medium Risk (08/16/2024)   SDOH Interventions:     Readmission Risk Interventions    04/11/2024    5:11 PM 01/07/2024   11:31 AM 06/17/2023    4:23 PM   Readmission Risk Prevention Plan  Transportation Screening -- Complete Complete  HRI or Home Care Consult   Complete  Social Work Consult for Recovery Care Planning/Counseling   Complete  Palliative Care Screening   Not Applicable  Medication Review Oceanographer) -- Complete Complete  PCP or Specialist appointment within 3-5 days of discharge  Complete   HRI or Home Care Consult Patient refused Complete   SW Recovery Care/Counseling Consult Patient refused Complete   Palliative Care Screening Patient Refused Not Applicable   Skilled Nursing Facility -- Not Applicable

## 2024-08-17 NOTE — Evaluation (Signed)
 Physical Therapy Evaluation Patient Details Name: Douglas Peterson. MRN: [id number] DOB: Aug 16, 1951 Today's Date: 08/17/2024  History of Present Illness  Douglas Peterson. is a 73 y.o. male with medical history significant for paroxysmal A-fib not on AC, HTN, spastic paralysis right upper extremity,alcohol use disorder ,  numerous hospitalizations for alcohol-related issues mostly related to abdominal pain/ vomiting/hyponatremia /alcohol withdrawal,   multiple ED visits including most recently 07/11/2024 for a fall related complaint, discharged from ED to rehab, discharged home from rehab a week ago, now presenting to the ED with possible alcoholic gastritis.  He presents with 2 days of vomiting, throat pain and abdominal pain and decreased appetite.  Clinical Impression  Pt ultimately did quite well and was able to manage most sponge bath and dressing ADLs with only limited set up.  He reports feeling weaker than his normal, but overall doing better than most hospitalizations.  Pt was able to ambulate ~250 ft and negotiate 6 steps with SPC/rail and generally did well with only occasionally low grade staggering that did not require assist to arrest.  Pt will benefit from continued PT to address functional limitations and changes from baseline but ultimately showed good awareness and safety t/o session.        If plan is discharge home, recommend the following: Assist for transportation;Assistance with cooking/housework   Can travel by private vehicle        Equipment Recommendations None recommended by PT  Recommendations for Other Services       Functional Status Assessment Patient has had a recent decline in their functional status and demonstrates the ability to make significant improvements in function in a reasonable and predictable amount of time.     Precautions / Restrictions Precautions Precautions: Fall Recall of Precautions/Restrictions: Intact Restrictions Weight Bearing  Restrictions Per Provider Order: No      Mobility  Bed Mobility Overal bed mobility: Modified Independent                  Transfers Overall transfer level: Modified independent Equipment used: Straight cane Transfers: Sit to/from Stand Sit to Stand: Supervision           General transfer comment: able to transition to/from sitting many times t/o session w/o need for assist    Ambulation/Gait Ambulation/Gait assistance: Supervision Gait Distance (Feet): 250 Feet Assistive device: Straight cane         General Gait Details: Pt eager and able to do prolonged bout of ambulation, consistent but slow cadence with appropriate SPC use.  Cuing t/o for AD use, postural and knee positioning awareness.  Stairs Stairs: Yes Stairs assistance: Supervision Stair Management: One rail Left, Alternating pattern Number of Stairs: 6 General stair comments: able to ascend and descend steps reciprocally w/o issue, heavy UE able to carry cane with R UE  Wheelchair Mobility     Tilt Bed    Modified Rankin (Stroke Patients Only)       Balance Overall balance assessment: Needs assistance Sitting-balance support: No upper extremity supported, Feet supported Sitting balance-Leahy Scale: Normal     Standing balance support: No upper extremity supported, During functional activity Standing balance-Leahy Scale: Fair                               Pertinent Vitals/Pain Pain Assessment Pain Assessment: No/denies pain    Home Living Family/patient expects to be discharged to:: Private residence Living Arrangements: Alone Available Help at  Discharge: Neighbor;Available PRN/intermittently Type of Home: House Home Access: Stairs to enter Entrance Stairs-Rails: Can reach both Entrance Stairs-Number of Steps: 3   Home Layout: One level Home Equipment: Cane - single point;Wheelchair - manual      Prior Function Prior Level of Function : Independent/Modified  Independent             Mobility Comments: Pt reports use of SPC for short distance indoor mobility. Also reports transfering to/from manual wheelchair and uses it for mobility at times.       Extremity/Trunk Assessment   Upper Extremity Assessment Upper Extremity Assessment:  (L UE functional with age appropriate defecits) RUE Deficits / Details: hx of R UE contracture    Lower Extremity Assessment Lower Extremity Assessment: Generalized weakness       Communication   Communication Communication: Impaired Factors Affecting Communication: Reduced clarity of speech    Cognition Arousal: Alert Behavior During Therapy: WFL for tasks assessed/performed   PT - Cognitive impairments: No apparent impairments                         Following commands: Intact       Cueing Cueing Techniques: Verbal cues     General Comments General comments (skin integrity, edema, etc.): Pt was able to fully bathe (only assisted with upper back washing) with wash clothes at sink, dof and don shoes and pull up and overall showed good competence and awareness with self care and associated safety    Exercises     Assessment/Plan    PT Assessment Patient needs continued PT services  PT Problem List Decreased activity tolerance;Decreased safety awareness;Decreased strength;Decreased balance;Decreased mobility       PT Treatment Interventions DME instruction;Gait training;Stair training;Functional mobility training;Therapeutic activities;Therapeutic exercise;Balance training;Patient/family education    PT Goals (Current goals can be found in the Care Plan section)  Acute Rehab PT Goals Patient Stated Goal: go home tomorrow PT Goal Formulation: With patient Time For Goal Achievement: 08/30/24 Potential to Achieve Goals: Good    Frequency Min 1X/week     Co-evaluation               AM-PAC PT 6 Clicks Mobility  Outcome Measure Help needed turning from your back  to your side while in a flat bed without using bedrails?: None Help needed moving from lying on your back to sitting on the side of a flat bed without using bedrails?: None Help needed moving to and from a bed to a chair (including a wheelchair)?: None Help needed standing up from a chair using your arms (e.g., wheelchair or bedside chair)?: None Help needed to walk in hospital room?: A Little Help needed climbing 3-5 steps with a railing? : A Little 6 Click Score: 22    End of Session   Activity Tolerance: Patient tolerated treatment well Patient left: with bed alarm set;with call bell/phone within reach Nurse Communication: Mobility status PT Visit Diagnosis: Muscle weakness (generalized) (M62.81);Unsteadiness on feet (R26.81);Ataxic gait (R26.0);Hemiplegia and hemiparesis Hemiplegia - Right/Left: Right Hemiplegia - dominant/non-dominant: Dominant Hemiplegia - caused by: Cerebral infarction    Time: 1445-1525 PT Time Calculation (min) (ACUTE ONLY): 40 min   Charges:   PT Evaluation $PT Eval Low Complexity: 1 Low PT Treatments $Gait Training: 8-22 mins $Therapeutic Activity: 8-22 mins PT General Charges $$ ACUTE PT VISIT: 1 Visit         Carmin JONELLE Deed, DPT 08/17/2024, 5:23 PM

## 2024-08-17 NOTE — Discharge Instructions (Signed)
 Intensive Outpatient Programs   [facility] [address] [place],  [place], [place] [phone]  [facility] (Inpatient and outpatient)[phone] (Suboxone and Methadone) [address] [phone]  [facility] - Intensive Outpatient [address] [place], [place] [address]  [phone]  [facility] (Outpatient, Inpatient, [facility] (Groups and Residental) (insurance only) [phone] [place], [place] [phone]   [facility] (for caregivers and family) [address] [place] [address] [place], [place] [phone]  Residential Treatment Programs  [facility](2 years) Residential: 11 days)[facility] [address] [facility] [address] [place], [place], [place] [phone] or [phone]  [facility] [address] [place], [place], [place] [phone]  [facility] ([facility]) [address] [place], [place], [place] [phone] Admissions: 8am-3pm M-F  [facility]: Residential Program [phone] Days)             [facility]: [facility]  [place], [place], [place]  [phone] or [phone] in Hours over the weekend or by referral)  [facility] [address] [place], [place] [address] [phone] (Do virtual or phone assessment, offer transportation within 25 miles, have in patient and Outpatient options)   Mobil Crisis: [facility]:[phone] (for crisis  response 24 hours a day)    Do you feel isolated?  [facility] offers a [employer] that anyone can call toll free at [phone]. The friendship line is available 24 hours a day  [facility] is a Program of All-inclusive Care for the Elderly (PACE). Their mission is to promote and sustain the independence of seniors wishing to remain in the community. They provide seniors with comprehensive long-term health, social, medical and dietary care. Their program is a safe alternative to nursing home care. [phone]  [facility] Address [facility] [address] [place], [place] [address] Phone: [phone]. . Online zoom yoga class, connect with others without leaving your home [facility] offers [place] dance cardio sessions for individuals via Zoom. This program provides: - Dance fitness activities Please contact program for more information. Servinganyone in need adults 18+ hiv/aids individuals families Call [phone]  Email [email or web address] to get more info  [employer] offers an online [employer] to individuals where they can receive help to focus on their best health. Whether you're a [employer] member or not, the neighborhood center offers a... Main Serviceshealth education  exercise & fitness  community support services  recreation  virtual support Other Servicessupport groups Servinganyone in need adults young adults teens seniors individuals families [email or web address] to get more info  Schedule on their website  [facility] offers an array of activities for adults age 49 and over. This program provides:- Fitness and health programs- Tech classes- Activity books Main Serviceshealth education  community support services  exercise & fitness  recreation  more education Servingseniors  Call [phone]    For more resources go online to [email or web address] and  type in you zipcode
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
## 2024-08-17 NOTE — Evaluation (Signed)
 Occupational Therapy Evaluation Patient Details Name: Douglas Peterson. MRN: [id number] DOB: November 05, 1950 Today's Date: 08/17/2024   History of Present Illness   Douglas Peterson. is a 73 y.o. male with medical history significant for paroxysmal A-fib not on AC, HTN, spastic paralysis right upper extremity,alcohol use disorder ,  numerous hospitalizations for alcohol-related issues mostly related to abdominal pain/ vomiting/hyponatremia /alcohol withdrawal,   multiple ED visits including most recently 07/11/2024 for a fall related complaint, discharged from ED to rehab, discharged home from rehab a week ago, now presenting to the ED with possible alcoholic gastritis.  He presents with 2 days of vomiting, throat pain and abdominal pain and decreased appetite.    Clinical Impressions Mr Ozaki was seen for OT evaluation this date. Prior to hospital admission, pt was MOD I using SPC and w/c as needed. Pt lives alone. Pt currently requires SUPERVISION standing grooming task. MOD I bed mobility and don shoes in sitting. CGA + SPC for ADL t/f ~200 ft. Pt would benefit from skilled OT to address noted impairments and functional limitations (see below for any additional details). Upon hospital discharge, recommend OT follow up.     If plan is discharge home, recommend the following:   Supervision due to cognitive status;Help with stairs or ramp for entrance     Functional Status Assessment   Patient has had a recent decline in their functional status and demonstrates the ability to make significant improvements in function in a reasonable and predictable amount of time.     Equipment Recommendations   None recommended by OT     Recommendations for Other Services         Precautions/Restrictions   Precautions Precautions: Fall Recall of Precautions/Restrictions: Intact Restrictions Weight Bearing Restrictions Per Provider Order: No     Mobility Bed Mobility Overal bed mobility:  Modified Independent                  Transfers Overall transfer level: Needs assistance Equipment used: Straight cane Transfers: Sit to/from Stand Sit to Stand: Contact guard assist                  Balance Overall balance assessment: Needs assistance Sitting-balance support: No upper extremity supported, Feet supported Sitting balance-Leahy Scale: Normal     Standing balance support: No upper extremity supported, During functional activity Standing balance-Leahy Scale: Fair                             ADL either performed or assessed with clinical judgement   ADL Overall ADL's : Needs assistance/impaired                                       General ADL Comments: Pt primarily uses L hand to perform grooming ADLs (comb hair, wash hands, brush teeth) with limited R hand assistance due to history of contracture. SUPERVISION standing grooming task. CGA + SPC for ADL t/f ~200 ft.       Pertinent Vitals/Pain Pain Assessment Pain Assessment: No/denies pain     Extremity/Trunk Assessment Upper Extremity Assessment Upper Extremity Assessment: RUE deficits/detail RUE Deficits / Details: hx of contracture   Lower Extremity Assessment Lower Extremity Assessment: Overall WFL for tasks assessed       Communication Communication Communication: Impaired Factors Affecting Communication: Reduced clarity of speech   Cognition Arousal: Alert  Behavior During Therapy: [facility] for tasks assessed/performed Cognition: No apparent impairments                               Following commands: Intact                  Home Living Family/patient expects to be discharged to:: Private residence Living Arrangements: Alone Available Help at Discharge: Neighbor;Available PRN/intermittently Type of Home: House Home Access: Stairs to enter [employer] of Steps: 3 Entrance Stairs-Rails: Can reach both Home Layout: One  level     Bathroom Shower/Tub: Chief Strategy Officer: Standard     Home Equipment: Cane - single point;Wheelchair - manual          Prior Functioning/Environment Prior Level of Function : Independent/Modified Independent             Mobility Comments: Pt reports use of SPC for short distance indoor mobility. Also reports transfering to/from manual wheelchair and uses it for mobility at times.      OT Problem List: Decreased activity tolerance;Decreased strength;Impaired balance (sitting and/or standing)   OT Treatment/Interventions: Self-care/ADL training;Therapeutic exercise;Energy conservation;DME and/or AE instruction;Therapeutic activities;Patient/family education      OT Goals(Current goals can be found in the care plan section)   Acute Rehab OT Goals Patient Stated Goal: to go home OT Goal Formulation: With patient Time For Goal Achievement: 08/31/24 Potential to Achieve Goals: Good ADL Goals Pt Will Perform Grooming: with modified independence;standing Pt Will Perform Lower Body Dressing: with modified independence;sit to/from stand Pt Will Transfer to Toilet: with modified independence;ambulating;regular height toilet   OT Frequency:  Min 2X/week    Co-evaluation              AM-PAC OT 6 Clicks Daily Activity     Outcome Measure Help from another person eating meals?: None Help from another person taking care of personal grooming?: None Help from another person toileting, which includes using toliet, bedpan, or urinal?: A Little Help from another person bathing (including washing, rinsing, drying)?: A Little Help from another person to put on and taking off regular upper body clothing?: A Little Help from another person to put on and taking off regular lower body clothing?: A Little 6 Click Score: 20   End of Session Equipment Utilized During Treatment: Gait belt Nurse Communication: Mobility status  Activity Tolerance: Patient  tolerated treatment well Patient left: in chair;with call bell/phone within reach;with chair alarm set  OT Visit Diagnosis: Other abnormalities of gait and mobility (R26.89)                Time: 8863-8847 OT Time Calculation (min): 16 min Charges:  OT General Charges $OT Visit: 1 Visit OT Evaluation $OT Eval Low Complexity: 1 Low  Elston Slot, M.S. OTR/L  08/17/24, 1:25 PM  ascom [phone]

## 2024-08-17 NOTE — Plan of Care (Signed)

## 2024-08-18 DIAGNOSIS — R1310 Dysphagia, unspecified: Secondary | ICD-10-CM | POA: Diagnosis not present

## 2024-08-18 DIAGNOSIS — R112 Nausea with vomiting, unspecified: Secondary | ICD-10-CM | POA: Diagnosis not present

## 2024-08-18 DIAGNOSIS — F10288 Alcohol dependence with other alcohol-induced disorder: Secondary | ICD-10-CM | POA: Diagnosis not present

## 2024-08-18 DIAGNOSIS — K209 Esophagitis, unspecified without bleeding: Secondary | ICD-10-CM | POA: Diagnosis not present

## 2024-08-18 LAB — CBC
HCT: 39.6 % (ref 39.0–52.0)
Hemoglobin: 13.9 g/dL (ref 13.0–17.0)
MCH: 32 pg (ref 26.0–34.0)
MCHC: 35.1 g/dL (ref 30.0–36.0)
MCV: 91 fL (ref 80.0–100.0)
Platelets: 139 K/uL — ABNORMAL LOW (ref 150–400)
RBC: 4.35 MIL/uL (ref 4.22–5.81)
RDW: 11.9 % (ref 11.5–15.5)
WBC: 6.6 K/uL (ref 4.0–10.5)
nRBC: 0 % (ref 0.0–0.2)

## 2024-08-18 LAB — PHOSPHORUS: Phosphorus: 4.3 mg/dL (ref 2.5–4.6)

## 2024-08-18 LAB — BASIC METABOLIC PANEL WITH GFR
Anion gap: 11 (ref 5–15)
BUN: 8 mg/dL (ref 8–23)
CO2: 25 mmol/L (ref 22–32)
Calcium: 8.8 mg/dL — ABNORMAL LOW (ref 8.9–10.3)
Chloride: 100 mmol/L (ref 98–111)
Creatinine, Ser: 0.67 mg/dL (ref 0.61–1.24)
GFR, Estimated: >60 mL/min (ref >60)
Glucose, Bld: 101 mg/dL — ABNORMAL HIGH (ref 70–99)
Potassium: 3.6 mmol/L (ref 3.5–5.1)
Sodium: 136 mmol/L (ref 135–145)

## 2024-08-18 LAB — MAGNESIUM: Magnesium: 1.9 mg/dL (ref 1.7–2.4)

## 2024-08-18 MED ORDER — PANCRELIPASE (LIP-PROT-AMYL) 24000-76000 UNITS PO CPEP: 24000.0000 [IU] | ORAL_CAPSULE | Freq: Three times a day (TID) | ORAL | 0 refills | Status: DC

## 2024-08-18 NOTE — Plan of Care (Signed)
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
## 2024-08-18 NOTE — Plan of Care (Signed)

## 2024-08-18 NOTE — Progress Notes (Signed)
 Mobility Specialist - Progress Note   08/18/24 1022  Mobility  Activity Stood at bedside;Ambulated with assistance  Level of Assistance Standby assist, set-up cues, supervision of patient - no hands on  Assistive Device [employer] Ambulated (ft) 160 ft  Activity Response Tolerated well  Mobility visit 1 Mobility  Mobility Specialist Start Time (ACUTE ONLY) 0955  Mobility Specialist Stop Time (ACUTE ONLY) 1018  Mobility Specialist Time Calculation (min) (ACUTE ONLY) 23 min   Pt stood at the sink, completed oral care and combs hair--- set up for oral care. Pt amb one lap around the NS, tolerated well. Pt returned to the room, left semi fowler with needs within reach. RN notified.  America Silvan Mobility Specialist 08/18/24 10:27 AM

## 2024-08-18 NOTE — TOC Transition Note (Signed)
 Transition of Care [facility]) - Discharge Note   Patient Details  Name: Douglas Peterson. MRN: [id number] Date of Birth: [birth year]  [facility] CM/SW Contact:  Corean ONEIDA Haddock, RN Phone Number: 08/18/2024, 9:59 AM   Clinical Narrative:      Patient to dc today Channing with [facility] notified of discharge        Patient Goals and CMS Choice            Discharge Placement                       Discharge Plan and Services Additional resources added to the After Visit Summary for                                       Social Drivers of Health (SDOH) Interventions SDOH Screenings   Food Insecurity: No Food Insecurity (08/16/2024)  Housing: Low Risk  (08/16/2024)  Transportation Needs: No Transportation Needs (08/16/2024)  Utilities: Not At Risk (08/16/2024)  Financial Resource Strain: Unknown (06/13/2019)  Physical Activity: Unknown (06/13/2019)  Social Connections: Socially Isolated (08/16/2024)  Stress: Unknown (06/13/2019)  Tobacco Use: Medium Risk (08/16/2024)     Readmission Risk Interventions    04/11/2024    5:11 PM 01/07/2024   11:31 AM 06/17/2023    4:23 PM  Readmission Risk Prevention Plan  Transportation Screening -- Complete Complete  HRI or Home Care Consult   Complete  Social Work Consult for Recovery Care Planning/Counseling   Complete  Palliative Care Screening   Not Applicable  Medication Review Oceanographer) -- Complete Complete  PCP or Specialist appointment within 3-5 days of discharge  Complete   HRI or Home Care Consult Patient refused Complete   SW Recovery Care/Counseling Consult Patient refused Complete   Palliative Care Screening Patient Refused Not Applicable   Skilled Nursing Facility -- Not Applicable

## 2024-08-31 ENCOUNTER — Other Ambulatory Visit: Payer: Self-pay

## 2024-08-31 ENCOUNTER — Encounter: Payer: Self-pay | Admitting: Emergency Medicine

## 2024-08-31 ENCOUNTER — Emergency Department

## 2024-08-31 ENCOUNTER — Emergency Department
Admission: EM | Admit: 2024-08-31 | Discharge: 2024-08-31 | Disposition: A | Discharge status: Home / Self Care | Attending: Emergency Medicine | Admitting: Emergency Medicine

## 2024-08-31 DIAGNOSIS — Y909 Presence of alcohol in blood, level not specified: Secondary | ICD-10-CM | POA: Insufficient documentation

## 2024-08-31 DIAGNOSIS — F10129 Alcohol abuse with intoxication, unspecified: Secondary | ICD-10-CM | POA: Diagnosis not present

## 2024-08-31 DIAGNOSIS — Z79899 Other long term (current) drug therapy: Secondary | ICD-10-CM | POA: Diagnosis not present

## 2024-08-31 DIAGNOSIS — I10 Essential (primary) hypertension: Secondary | ICD-10-CM | POA: Insufficient documentation

## 2024-08-31 DIAGNOSIS — R112 Nausea with vomiting, unspecified: Secondary | ICD-10-CM | POA: Insufficient documentation

## 2024-08-31 DIAGNOSIS — S0990XA Unspecified injury of head, initial encounter: Secondary | ICD-10-CM | POA: Insufficient documentation

## 2024-08-31 DIAGNOSIS — R7401 Elevation of levels of liver transaminase levels: Secondary | ICD-10-CM | POA: Diagnosis not present

## 2024-08-31 DIAGNOSIS — W050XXA Fall from non-moving wheelchair, initial encounter: Secondary | ICD-10-CM | POA: Insufficient documentation

## 2024-08-31 DIAGNOSIS — F10920 Alcohol use, unspecified with intoxication, uncomplicated: Secondary | ICD-10-CM

## 2024-08-31 LAB — URINALYSIS, W/ REFLEX TO CULTURE (INFECTION SUSPECTED)
Bacteria, UA: NONE SEEN
Bilirubin Urine: NEGATIVE
Glucose, UA: NEGATIVE mg/dL
Hgb urine dipstick: NEGATIVE
Ketones, ur: NEGATIVE mg/dL
Leukocytes,Ua: NEGATIVE
Nitrite: NEGATIVE
Protein, ur: NEGATIVE mg/dL
Specific Gravity, Urine: 1.005 (ref 1.005–1.030)
Squamous Epithelial / HPF: 0 /HPF (ref 0–5)
pH: 6 (ref 5.0–8.0)

## 2024-08-31 LAB — CBC WITH DIFFERENTIAL/PLATELET
Abs Immature Granulocytes: 0.02 K/uL (ref 0.00–0.07)
Basophils Absolute: 0.1 K/uL (ref 0.0–0.1)
Basophils Relative: 1 %
Eosinophils Absolute: 0.1 K/uL (ref 0.0–0.5)
Eosinophils Relative: 1 %
HCT: 39.9 % (ref 39.0–52.0)
Hemoglobin: 13.5 g/dL (ref 13.0–17.0)
Immature Granulocytes: 0 %
Lymphocytes Relative: 64 %
Lymphs Abs: 6.4 K/uL — ABNORMAL HIGH (ref 0.7–4.0)
MCH: 31.9 pg (ref 26.0–34.0)
MCHC: 33.8 g/dL (ref 30.0–36.0)
MCV: 94.3 fL (ref 80.0–100.0)
Monocytes Absolute: 0.6 K/uL (ref 0.1–1.0)
Monocytes Relative: 6 %
Neutro Abs: 2.7 K/uL (ref 1.7–7.7)
Neutrophils Relative %: 28 %
Platelets: 195 K/uL (ref 150–400)
RBC: 4.23 MIL/uL (ref 4.22–5.81)
RDW: 12.5 % (ref 11.5–15.5)
Smear Review: NORMAL
WBC: 9.9 K/uL (ref 4.0–10.5)
nRBC: 0 % (ref 0.0–0.2)

## 2024-08-31 LAB — COMPREHENSIVE METABOLIC PANEL WITH GFR
ALT: 72 U/L — ABNORMAL HIGH (ref 0–44)
AST: 106 U/L — ABNORMAL HIGH (ref 15–41)
Albumin: 4.1 g/dL (ref 3.5–5.0)
Alkaline Phosphatase: 89 U/L (ref 38–126)
Anion gap: 16 — ABNORMAL HIGH (ref 5–15)
BUN: 6 mg/dL — ABNORMAL LOW (ref 8–23)
CO2: 25 mmol/L (ref 22–32)
Calcium: 8.3 mg/dL — ABNORMAL LOW (ref 8.9–10.3)
Chloride: 92 mmol/L — ABNORMAL LOW (ref 98–111)
Creatinine, Ser: 0.43 mg/dL — ABNORMAL LOW (ref 0.61–1.24)
GFR, Estimated: >60 mL/min (ref >60)
Glucose, Bld: 94 mg/dL (ref 70–99)
Potassium: 3.6 mmol/L (ref 3.5–5.1)
Sodium: 133 mmol/L — ABNORMAL LOW (ref 135–145)
Total Bilirubin: 1.1 mg/dL (ref 0.0–1.2)
Total Protein: 7.9 g/dL (ref 6.5–8.1)

## 2024-08-31 LAB — TROPONIN I (HIGH SENSITIVITY): Troponin I (High Sensitivity): 6 ng/L (ref <18)

## 2024-08-31 LAB — LIPASE, BLOOD: Lipase: 38 U/L (ref 11–51)

## 2024-08-31 LAB — MAGNESIUM: Magnesium: 1.9 mg/dL (ref 1.7–2.4)

## 2024-08-31 MED ORDER — LORAZEPAM 2 MG PO TABS: 0.0000 mg | ORAL_TABLET | Freq: Two times a day (BID) | ORAL | Status: DC

## 2024-08-31 MED ORDER — LORAZEPAM 2 MG/ML IJ SOLN: 0.0000 mg | Freq: Four times a day (QID) | INTRAMUSCULAR | Status: DC

## 2024-08-31 MED ORDER — KETOROLAC TROMETHAMINE 30 MG/ML IJ SOLN
15.0000 mg | Freq: Once | INTRAMUSCULAR | Status: AC
  Administered 2024-08-31: 15 mg via INTRAVENOUS
  Filled 2024-08-31: qty 1

## 2024-08-31 MED ORDER — SODIUM CHLORIDE 0.9 % IV BOLUS (SEPSIS)
1000.0000 mL | Freq: Once | INTRAVENOUS | Status: AC
  Administered 2024-08-31: 1000 mL via INTRAVENOUS

## 2024-08-31 MED ORDER — ATORVASTATIN CALCIUM 20 MG PO TABS
10.0000 mg | ORAL_TABLET | Freq: Every day | ORAL | Status: DC
  Administered 2024-08-31: 10 mg via ORAL
  Filled 2024-08-31: qty 1

## 2024-08-31 MED ORDER — SUCRALFATE 1 G PO TABS
1.0000 g | ORAL_TABLET | Freq: Three times a day (TID) | ORAL | Status: DC
  Filled 2024-08-31: qty 1

## 2024-08-31 MED ORDER — THIAMINE MONONITRATE 100 MG PO TABS
100.0000 mg | ORAL_TABLET | Freq: Every day | ORAL | Status: DC
  Administered 2024-08-31: 100 mg via ORAL
  Filled 2024-08-31: qty 1

## 2024-08-31 MED ORDER — PANCRELIPASE (LIP-PROT-AMYL) 12000-38000 UNITS PO CPEP
24000.0000 [IU] | ORAL_CAPSULE | Freq: Three times a day (TID) | ORAL | Status: DC
  Filled 2024-08-31 (×3): qty 2

## 2024-08-31 MED ORDER — LORAZEPAM 2 MG/ML IJ SOLN: 0.0000 mg | Freq: Two times a day (BID) | INTRAMUSCULAR | Status: DC

## 2024-08-31 MED ORDER — ACETAMINOPHEN 10 MG/ML IV SOLN
1000.0000 mg | Freq: Once | INTRAVENOUS | Status: AC
  Administered 2024-08-31: 1000 mg via INTRAVENOUS
  Filled 2024-08-31: qty 100

## 2024-08-31 MED ORDER — AMLODIPINE BESYLATE 5 MG PO TABS
5.0000 mg | ORAL_TABLET | Freq: Every day | ORAL | Status: DC
  Administered 2024-08-31: 5 mg via ORAL
  Filled 2024-08-31: qty 1

## 2024-08-31 MED ORDER — TRAMADOL HCL 50 MG PO TABS
50.0000 mg | ORAL_TABLET | Freq: Once | ORAL | Status: AC
  Administered 2024-08-31: 50 mg via ORAL
  Filled 2024-08-31: qty 1

## 2024-08-31 MED ORDER — FOLIC ACID 1 MG PO TABS
1.0000 mg | ORAL_TABLET | Freq: Every day | ORAL | Status: DC
  Administered 2024-08-31: 1 mg via ORAL
  Filled 2024-08-31: qty 1

## 2024-08-31 MED ORDER — THIAMINE HCL 100 MG PO TABS
100.0000 mg | ORAL_TABLET | Freq: Every day | ORAL | Status: DC
  Administered 2024-08-31: 100 mg via ORAL
  Filled 2024-08-31 (×2): qty 1

## 2024-08-31 MED ORDER — ONDANSETRON 4 MG PO TBDP: 4.0000 mg | ORAL_TABLET | Freq: Once | ORAL | Status: DC

## 2024-08-31 MED ORDER — THIAMINE HCL 100 MG/ML IJ SOLN
100.0000 mg | Freq: Once | INTRAMUSCULAR | Status: AC
  Administered 2024-08-31: 100 mg via INTRAVENOUS
  Filled 2024-08-31: qty 2

## 2024-08-31 MED ORDER — THIAMINE HCL 100 MG/ML IJ SOLN: 100.0000 mg | Freq: Every day | INTRAMUSCULAR | Status: DC

## 2024-08-31 MED ORDER — PANTOPRAZOLE SODIUM 40 MG PO TBEC
40.0000 mg | DELAYED_RELEASE_TABLET | Freq: Every day | ORAL | Status: DC
  Administered 2024-08-31: 40 mg via ORAL
  Filled 2024-08-31: qty 1

## 2024-08-31 MED ORDER — METOCLOPRAMIDE HCL 5 MG/ML IJ SOLN
5.0000 mg | Freq: Once | INTRAMUSCULAR | Status: AC
  Administered 2024-08-31: 5 mg via INTRAVENOUS
  Filled 2024-08-31: qty 2

## 2024-08-31 MED ORDER — ONDANSETRON HCL 4 MG/2ML IJ SOLN
4.0000 mg | Freq: Once | INTRAMUSCULAR | Status: AC
  Administered 2024-08-31: 4 mg via INTRAVENOUS
  Filled 2024-08-31: qty 2

## 2024-08-31 MED ORDER — ACETAMINOPHEN 500 MG PO TABS: 1000.0000 mg | ORAL_TABLET | Freq: Once | ORAL | Status: DC

## 2024-08-31 MED ORDER — ATENOLOL 25 MG PO TABS
25.0000 mg | ORAL_TABLET | Freq: Every day | ORAL | Status: DC
  Administered 2024-08-31: 25 mg via ORAL
  Filled 2024-08-31: qty 1

## 2024-08-31 MED ORDER — LORAZEPAM 2 MG PO TABS: 0.0000 mg | ORAL_TABLET | Freq: Four times a day (QID) | ORAL | Status: DC

## 2024-08-31 NOTE — ED Notes (Signed)
 Pt states he is having hip pain and has had a recent fall at home. Md made aware of same.

## 2024-08-31 NOTE — ED Triage Notes (Signed)
 Pt arrived via [facility] from pts home post fall out of his wheelchair. Pt reports hitting his head but denies LOC.    ** Has Legal Guardian

## 2024-08-31 NOTE — Discharge Instructions (Signed)
 Your lab work, urine, EKG was reassuring today.  CT imaging of your head and neck showed no acute traumatic injury.

## 2024-08-31 NOTE — ED Provider Notes (Signed)
 Care assumed of patient from outgoing provider.  See their note for initial history, exam and plan.  History of alcohol use, who presented to the emergency department following a fall.  Wheelchair-bound.  Waiting for a ride.  Complaining of some right hip pain.  X-ray of the right hip with no acute fracture.  Able to stand and bear weight.  Friend came to pick up the patient and take him home.  Discussed return precautions     Suzanne Kirsch, MD 08/31/24 [phone]

## 2024-08-31 NOTE — ED Notes (Signed)
 Pt called his neighboor and stated that he could pick him up before 1500.
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
## 2024-08-31 NOTE — ED Notes (Signed)
 Cleaned pt of urine. [name] bed linen changed. Gown applied to pt. Pt given urinal. Pt A&Ox4.

## 2024-08-31 NOTE — TOC Initial Note (Addendum)
 Transition of Care (TOC) - Initial/Assessment Note    Patient Details  Name: Douglas Peterson. MRN: [id number] Date of Birth: 12-15-1950  Transition of Care [facility]) CM/SW Contact:    Tayler Heiden L Atlantis Delong, LCSW Phone Number: 08/31/2024, 8:04 AM  Clinical Narrative:                  [facility] consult received for transportation needs. [name] in the ED. The nurse secretary can arrange transportation for patient. If patient would like a bus pass, please advise TOC. Patient also has a legal guardian.   No further TOC needs identified.        Patient Goals and CMS Choice            Expected Discharge Plan and Services                                              Prior Living Arrangements/Services                       Activities of Daily Living      Permission Sought/Granted                  Emotional Assessment              Admission diagnosis:  EMS Fall and ETOH Patient Active Problem List   Diagnosis Date Noted   Acute esophagitis 08/16/2024   Elevated troponin 08/15/2024   Head injury 03/25/2024   Alcohol withdrawal (HCC) 03/19/2024   Incarcerated hernia 01/27/2024   S/P laparoscopic hernia repair 01/27/2024   Intractable nausea and vomiting 10/06/2023   Protein-calorie malnutrition, severe 08/20/2023   Ground-level fall 08/16/2023   GERD without esophagitis 08/06/2023   Nausea 07/19/2023   Pressure injury of skin 07/11/2023   Alcohol abuse 07/11/2023   Abdominal pain 06/16/2023   Elevated lactic acid level 06/16/2023   Chronic diastolic CHF (congestive heart failure) (HCC) 06/16/2023   Increased urinary frequency 06/08/2023   Diarrhea 06/08/2023   Upper respiratory infection 06/04/2023   Enteritis 06/02/2023   Polyuria 06/02/2023   Dyslipidemia 05/01/2023   Alcohol dependence (HCC) 05/01/2023   Maggot infestation feet 05/11/2022   Unable to care for self 05/11/2022   Debility 04/25/2022   Weakness 04/23/2022   Epistaxis  04/23/2022   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   Generalized weakness 03/18/2022   Nondisplaced fracture of shaft of left clavicle, initial encounter for closed fracture 03/18/2022   BPH (benign prostatic hyperplasia) 03/18/2022   Clavicle fracture 03/17/2022   Fall 03/17/2022   CAP (community acquired pneumonia) 03/13/2022   Altered mental status, unspecified 02/20/2022   Hypotension 02/19/2022   Dysphonia 08/13/2021   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   Paresthesia of right upper extremity 08/13/2021   SIRS (systemic inflammatory response syndrome) (HCC) 08/05/2020   GERD (gastroesophageal reflux disease) 06/05/2020   Alcohol use disorder, severe, dependence (HCC) 05/28/2020   Elevated LFTs 04/06/2020   Paroxysmal A-fib (HCC) 03/17/2020   Primary insomnia 02/06/2020   Left inguinal hernia 01/31/2020   Encounter for competency evaluation    Depression 01/17/2020   Pancytopenia (HCC)    Alcoholic cirrhosis of liver without ascites (HCC)    ETOH abuse    Alcohol intoxication    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) 09/27/2019   Hypokalemia 09/27/2019   Dysphagia 09/27/2019  Alcohol withdrawal delirium, acute, hyperactive (HCC) 09/21/2019   Pressure injury of ankle, stage 1 08/15/2019   Palliative care by specialist    Closed fracture of right proximal humerus 03/19/2019   Vitamin D  deficiency 01/11/2019   Osteoporosis 12/22/2018   Closed nondisplaced fracture of acromial process of right scapula with routine healing 11/15/2018   Compression fracture of L2 vertebra with routine healing 11/15/2018   Delirium tremens (HCC) 03/08/2018   Essential hypertension 09/16/2017   Encephalopathy, 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   Steatohepatitis 12/03/2016   Abnormal liver enzymes 06/17/2016   Hereditary hemochromatosis 06/17/2016   Anxiety 07/16/2015   Hyponatremia 07/09/2015   H/O traumatic brain injury 05/22/2014   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HCC)  05/22/2014   PCP:  Pcp, No Pharmacy:   [facility] [place] [place] - [address] [place] [address] Phone: [phone] Fax: [phone]     Social Drivers of Health (SDOH) Social History: SDOH Screenings   Food Insecurity: No Food Insecurity (08/16/2024)  Housing: Low Risk  (08/16/2024)  Transportation Needs: No Transportation Needs (08/16/2024)  Utilities: Not At Risk (08/16/2024)  Financial Resource Strain: Unknown (06/13/2019)  Physical Activity: Unknown (06/13/2019)  Social Connections: Socially Isolated (08/16/2024)  Stress: Unknown (06/13/2019)  Tobacco Use: Medium Risk (08/31/2024)   SDOH Interventions:     Readmission Risk Interventions    04/11/2024    5:11 PM 01/07/2024   11:31 AM 06/17/2023    4:23 PM  Readmission Risk Prevention Plan  Transportation Screening -- Complete Complete  HRI or Home Care Consult   Complete  Social Work Consult for Recovery Care Planning/Counseling   Complete  Palliative Care Screening   Not Applicable  Medication Review Oceanographer) -- Complete Complete  PCP or Specialist appointment within 3-5 days of discharge  Complete   HRI or Home Care Consult Patient refused Complete   SW Recovery Care/Counseling Consult Patient refused Complete   Palliative Care Screening Patient Refused Not Applicable   Skilled Nursing Facility -- Not Applicable

## 2024-08-31 NOTE — ED Notes (Signed)
 Pt able to stand with assistance and states his hip feels  sore but is able to move same.

## 2024-09-02 ENCOUNTER — Emergency Department
Admission: EM | Admit: 2024-09-02 | Discharge: 2024-09-03 | Disposition: A | Discharge status: Home / Self Care | Attending: Emergency Medicine | Admitting: Emergency Medicine

## 2024-09-02 ENCOUNTER — Other Ambulatory Visit: Payer: Self-pay

## 2024-09-02 DIAGNOSIS — F10920 Alcohol use, unspecified with intoxication, uncomplicated: Secondary | ICD-10-CM

## 2024-09-02 DIAGNOSIS — Y908 Blood alcohol level of 240 mg/100 ml or more: Secondary | ICD-10-CM | POA: Diagnosis not present

## 2024-09-02 DIAGNOSIS — R7401 Elevation of levels of liver transaminase levels: Secondary | ICD-10-CM | POA: Insufficient documentation

## 2024-09-02 DIAGNOSIS — F1020 Alcohol dependence, uncomplicated: Secondary | ICD-10-CM | POA: Insufficient documentation

## 2024-09-02 DIAGNOSIS — I10 Essential (primary) hypertension: Secondary | ICD-10-CM | POA: Insufficient documentation

## 2024-09-02 DIAGNOSIS — R251 Tremor, unspecified: Secondary | ICD-10-CM | POA: Diagnosis present

## 2024-09-02 LAB — COMPREHENSIVE METABOLIC PANEL WITH GFR
ALT: 57 U/L — ABNORMAL HIGH (ref 0–44)
AST: 73 U/L — ABNORMAL HIGH (ref 15–41)
Albumin: 3.7 g/dL (ref 3.5–5.0)
Alkaline Phosphatase: 84 U/L (ref 38–126)
Anion gap: 13 (ref 5–15)
BUN: <5 mg/dL — ABNORMAL LOW (ref 8–23)
CO2: 28 mmol/L (ref 22–32)
Calcium: 8.4 mg/dL — ABNORMAL LOW (ref 8.9–10.3)
Chloride: 98 mmol/L (ref 98–111)
Creatinine, Ser: 0.34 mg/dL — ABNORMAL LOW (ref 0.61–1.24)
GFR, Estimated: >60 mL/min (ref >60)
Glucose, Bld: 79 mg/dL (ref 70–99)
Potassium: 3.5 mmol/L (ref 3.5–5.1)
Sodium: 139 mmol/L (ref 135–145)
Total Bilirubin: 0.8 mg/dL (ref 0.0–1.2)
Total Protein: 7.4 g/dL (ref 6.5–8.1)

## 2024-09-02 LAB — CBC
HCT: 44.4 % (ref 39.0–52.0)
Hemoglobin: 15.4 g/dL (ref 13.0–17.0)
MCH: 32.3 pg (ref 26.0–34.0)
MCHC: 34.7 g/dL (ref 30.0–36.0)
MCV: 93.1 fL (ref 80.0–100.0)
Platelets: 162 K/uL (ref 150–400)
RBC: 4.77 MIL/uL (ref 4.22–5.81)
RDW: 12.3 % (ref 11.5–15.5)
WBC: 7 K/uL (ref 4.0–10.5)
nRBC: 0 % (ref 0.0–0.2)

## 2024-09-02 LAB — ETHANOL: Alcohol, Ethyl (B): 319 mg/dL (ref <15)

## 2024-09-02 MED ORDER — ADULT MULTIVITAMIN W/MINERALS CH
1.0000 | ORAL_TABLET | Freq: Every day | ORAL | Status: DC
  Administered 2024-09-02 – 2024-09-03 (×2): 1 via ORAL
  Filled 2024-09-02 (×2): qty 1

## 2024-09-02 MED ORDER — LORAZEPAM 1 MG PO TABS
1.0000 mg | ORAL_TABLET | ORAL | Status: DC | PRN
  Administered 2024-09-03 (×2): 1 mg via ORAL
  Filled 2024-09-02 (×2): qty 1

## 2024-09-02 MED ORDER — THIAMINE HCL 100 MG/ML IJ SOLN
100.0000 mg | Freq: Every day | INTRAMUSCULAR | Status: DC
Start: 2024-09-02 — End: 2024-09-03

## 2024-09-02 MED ORDER — LORAZEPAM 2 MG/ML IJ SOLN: 1.0000 mg | INTRAMUSCULAR | Status: DC | PRN

## 2024-09-02 MED ORDER — THIAMINE MONONITRATE 100 MG PO TABS
100.0000 mg | ORAL_TABLET | Freq: Every day | ORAL | Status: DC
  Administered 2024-09-02 – 2024-09-03 (×2): 100 mg via ORAL
  Filled 2024-09-02 (×2): qty 1

## 2024-09-02 MED ORDER — ACETAMINOPHEN 500 MG PO TABS
1000.0000 mg | ORAL_TABLET | Freq: Once | ORAL | Status: AC
  Administered 2024-09-02: 1000 mg via ORAL
  Filled 2024-09-02: qty 2

## 2024-09-02 MED ORDER — ONDANSETRON 4 MG PO TBDP
4.0000 mg | ORAL_TABLET | Freq: Once | ORAL | Status: AC
  Administered 2024-09-02: 4 mg via ORAL
  Filled 2024-09-02: qty 1

## 2024-09-02 MED ORDER — CALCIUM CARBONATE ANTACID 500 MG PO CHEW
1.0000 | CHEWABLE_TABLET | Freq: Three times a day (TID) | ORAL | Status: DC
  Administered 2024-09-03 (×2): 200 mg via ORAL
  Filled 2024-09-02 (×2): qty 1

## 2024-09-02 MED ORDER — FOLIC ACID 1 MG PO TABS
1.0000 mg | ORAL_TABLET | Freq: Every day | ORAL | Status: DC
  Administered 2024-09-02 – 2024-09-03 (×2): 1 mg via ORAL
  Filled 2024-09-02 (×2): qty 1

## 2024-09-02 NOTE — ED Notes (Signed)
 RN attempted to acquire blood work. RN and ED Tech did not see any appropriate veins.

## 2024-09-02 NOTE — ED Triage Notes (Signed)
 Pt to ED via [facility] for ETOH and being unable to care for himself at home. Pt arrived in hospital gown. Pt was soiled and wet.   Pt was wiped down with warm wipes and gown changed.  Vital within normal limits for EMS.

## 2024-09-02 NOTE — ED Notes (Signed)
 Pt stated last alcoholic drink was today.

## 2024-09-02 NOTE — ED Notes (Signed)
 Applied Pt on bedpan.

## 2024-09-02 NOTE — ED Notes (Signed)
 PT DOES NOT HAVE LEGAL GUARDIAN as of June 2025 - verified with [facility] on 07/13/2024 As of 07/13/2024 there is a case open, so pt has case child psychotherapist, but is own ward

## 2024-09-02 NOTE — ED Notes (Signed)
 Pt. C/o abdominal pain, MD Bradler notified face to face, orders to follow

## 2024-09-02 NOTE — ED Provider Notes (Signed)
 [facility] Provider Note    Event Date/Time   First MD Initiated Contact with Patient 09/02/24 [phone]     (approximate)  History   Chief Complaint: Alcohol Intoxication  HPI  Douglas Peterson. is a 73 y.o. male with a past medical history of alcohol abuse, atrial fibrillation, hypertension, TBI, presents to the emergency department from home.  When asked why he came to the emergency department he states shaking and that he was not feeling well.  He will not go into detail as to what was not feeling well.  Denies any vomiting.  [name] well-known to the emergency department, he is largely wheelchair-bound and lives at home alone.  Multiple attempts have been made over the years to find a new living arrangement for the patient or to help with home health needs all of which have been largely turned down by the patient in the past.  Will have social work see the patient in the emergency department.  Physical Exam   Triage Vital Signs: ED Triage Vitals  Encounter Vitals Group     BP 09/02/24 0641 124/68     Girls Systolic BP Percentile --      Girls Diastolic BP Percentile --      Boys Systolic BP Percentile --      Boys Diastolic BP Percentile --      Pulse Rate 09/02/24 0641 (!) 103     Resp 09/02/24 0641 18     Temp 09/02/24 0641 98.1 F (36.7 C)     Temp Source 09/02/24 0641 Oral     SpO2 09/02/24 0641 93 %     Weight 09/02/24 0641 116 lb 13.5 oz (53 kg)     Height 09/02/24 0641 5' 4 (1.626 m)     Head Circumference --      Peak Flow --      Pain Score --      Pain Loc --      Pain Education --      Exclude from Growth Chart --     Most recent vital signs: Vitals:   09/02/24 0641  BP: 124/68  Pulse: (!) 103  Resp: 18  Temp: 98.1 F (36.7 C)  SpO2: 93%    General: Somewhat somnolent but awakens to voice will answer questions.  Falls asleep if not actively engaged. CV:  Good peripheral perfusion.  Regular rate and rhythm  Resp:  Normal  effort.  Equal breath sounds bilaterally.  Abd:  No distention.  Soft, nontender.  ED Results / Procedures / Treatments   MEDICATIONS ORDERED IN ED: Medications - No data to display   IMPRESSION / MDM / ASSESSMENT AND PLAN / ED COURSE  I reviewed the triage vital signs and the nursing notes.  Patient's presentation is most consistent with acute presentation with potential threat to life or bodily function.  Patient presents to the emergency department for evaluation.  Patient lives at home alone, largely wheelchair-bound.  Patient has chronic alcoholism.  Will check labs including an alcohol level.  We have made multiple attempts in the past to find a new living arrangement for the patient or to help with home health needs most of which have been largely turned down by the patient.  Will have social work evaluate to see what there is to offer the patient.  We will continue to closely monitor while awaiting lab results.  Patient's lab work today shows mild LFT elevation likely related to alcoholism.  CBC is reassuring.  Ethanol level 319.  Patient will be discharged once a safe disposition can be arranged.  FINAL CLINICAL IMPRESSION(S) / ED DIAGNOSES   Alcoholism   Note:  This document was prepared using Dragon voice recognition software and may include unintentional dictation errors.   Dorothyann Drivers, MD 09/02/24 [phone]

## 2024-09-02 NOTE — ED Notes (Signed)
 Pt provided with meal tray and beverage at bedside

## 2024-09-02 NOTE — ED Notes (Signed)
 Patient sitting up in bed eating dinner, this writer cut patients food up upon request

## 2024-09-02 NOTE — ED Notes (Signed)
 Pt provided pericare at this time and clean brief and pad.

## 2024-09-02 NOTE — ED Notes (Signed)
 MD aware of ETOH 319

## 2024-09-02 NOTE — TOC Progression Note (Signed)
 Transition of Care (TOC) - Progression Note    Patient Details  Name: Douglas Peterson. MRN: [id number] Date of Birth: 03-05-1951  Transition of Care [facility]) CM/SW Contact  Celsa Nordahl L Sherod [place], [place] Phone Number: 09/02/2024, 8:10 AM  Clinical Narrative:     [facility] consult received for transportation needs. [name] in the ED. The nurse secretary can arrange transportation for patient. If patient would like a bus pass, please advise TOC. Patient also has a legal guardian.   No further TOC needs identified.                     Expected Discharge Plan and Services                                               Social Drivers of Health (SDOH) Interventions SDOH Screenings   Food Insecurity: No Food Insecurity (08/16/2024)  Housing: Low Risk  (08/16/2024)  Transportation Needs: No Transportation Needs (08/16/2024)  Utilities: Not At Risk (08/16/2024)  Financial Resource Strain: Unknown (06/13/2019)  Physical Activity: Unknown (06/13/2019)  Social Connections: Socially Isolated (08/16/2024)  Stress: Unknown (06/13/2019)  Tobacco Use: Medium Risk (09/02/2024)    Readmission Risk Interventions    04/11/2024    5:11 PM 01/07/2024   11:31 AM 06/17/2023    4:23 PM  Readmission Risk Prevention Plan  Transportation Screening -- Complete Complete  HRI or Home Care Consult   Complete  Social Work Consult for Recovery Care Planning/Counseling   Complete  Palliative Care Screening   Not Applicable  Medication Review Oceanographer) -- Complete Complete  PCP or Specialist appointment within 3-5 days of discharge  Complete   HRI or Home Care Consult Patient refused Complete   SW Recovery Care/Counseling Consult Patient refused Complete   Palliative Care Screening Patient Refused Not Applicable   Skilled Nursing Facility -- Not Applicable

## 2024-09-03 NOTE — ED Notes (Signed)
 Pt called out inquiring about his blood pressure medication. Morna, NT informed the pt that his nurse would give him his bp meds when they are due. Pt told that we would let his nurse know of his concern. No further requests from pt at this time.

## 2024-09-03 NOTE — Discharge Instructions (Addendum)
You were seen in the emergency department for alcohol intoxication.  Please seek help from the recommended resources for assistance with your alcohol dependence.  If you have any thoughts of hurting herself or others, please call 911 or return to the emergency department.  Please avoid drug and alcohol use.  Never drive a vehicle or operate machinery while intoxicated.  

## 2024-09-03 NOTE — ED Notes (Signed)
 Pt states neighbor is to pick him when discharged. Pt stated neighbor would be here around lunch time. RN called neighbor for update on arrival time and did not get an answer. RN left a HIPAA compliant message requesting a call back.

## 2024-09-03 NOTE — ED Provider Notes (Signed)
-----------------------------------------   3:41 AM on 09/03/2024 -----------------------------------------   Blood pressure (!) 160/89, pulse 92, temperature 98.8 F (37.1 C), temperature source Oral, resp. rate (!) 22, height 1.626 m (5' 4), weight 53 kg, SpO2 96%.  The patient is calm and cooperative at this time.  There have been no acute events since the last update.  Patient was evaluated by TOC but only from the perspective of transportation options.  Patient has no acute or emergent concerns at this time.  He is well-known to the emergency department and has been placed multiple times in facilities and always signed himself out.  He was even given a legal guardian at 1 point but used his financial resources to fight the ruling and regain his own legal capacity.  I talked with the patient and he seems to be at his baseline.  He acknowledged to me that he lives at home by himself and acknowledged that he does not want to stay in a facility.  The emergency department has nothing else to offer him at this time and I am discharging him home.   Gordan Huxley, MD 09/03/24 [phone]

## 2024-09-04 ENCOUNTER — Other Ambulatory Visit: Payer: Self-pay

## 2024-09-04 ENCOUNTER — Observation Stay
Admission: EM | Admit: 2024-09-04 | Discharge: 2024-09-05 | Disposition: A | Discharge status: Home / Self Care | Attending: Internal Medicine | Admitting: Internal Medicine

## 2024-09-04 DIAGNOSIS — F10939 Alcohol use, unspecified with withdrawal, unspecified: Secondary | ICD-10-CM | POA: Diagnosis not present

## 2024-09-04 DIAGNOSIS — Z87891 Personal history of nicotine dependence: Secondary | ICD-10-CM | POA: Insufficient documentation

## 2024-09-04 DIAGNOSIS — R1013 Epigastric pain: Secondary | ICD-10-CM

## 2024-09-04 DIAGNOSIS — Z79899 Other long term (current) drug therapy: Secondary | ICD-10-CM | POA: Insufficient documentation

## 2024-09-04 DIAGNOSIS — F1020 Alcohol dependence, uncomplicated: Secondary | ICD-10-CM | POA: Diagnosis present

## 2024-09-04 DIAGNOSIS — I10 Essential (primary) hypertension: Secondary | ICD-10-CM | POA: Diagnosis not present

## 2024-09-04 DIAGNOSIS — F10930 Alcohol use, unspecified with withdrawal, uncomplicated: Secondary | ICD-10-CM | POA: Diagnosis not present

## 2024-09-04 DIAGNOSIS — E871 Hypo-osmolality and hyponatremia: Principal | ICD-10-CM | POA: Diagnosis present

## 2024-09-04 DIAGNOSIS — I480 Paroxysmal atrial fibrillation: Secondary | ICD-10-CM | POA: Diagnosis not present

## 2024-09-04 LAB — OSMOLALITY, URINE: Osmolality, Ur: 186 mosm/kg — ABNORMAL LOW (ref 300–900)

## 2024-09-04 LAB — CBC WITH DIFFERENTIAL/PLATELET
Abs Immature Granulocytes: 0.03 K/uL (ref 0.00–0.07)
Basophils Absolute: 0 K/uL (ref 0.0–0.1)
Basophils Relative: 0 %
Eosinophils Absolute: 0.1 K/uL (ref 0.0–0.5)
Eosinophils Relative: 2 %
HCT: 38.2 % — ABNORMAL LOW (ref 39.0–52.0)
Hemoglobin: 12.9 g/dL — ABNORMAL LOW (ref 13.0–17.0)
Immature Granulocytes: 0 %
Lymphocytes Relative: 35 %
Lymphs Abs: 2.9 K/uL (ref 0.7–4.0)
MCH: 31.3 pg (ref 26.0–34.0)
MCHC: 33.8 g/dL (ref 30.0–36.0)
MCV: 92.7 fL (ref 80.0–100.0)
Monocytes Absolute: 0.5 K/uL (ref 0.1–1.0)
Monocytes Relative: 6 %
Neutro Abs: 4.7 K/uL (ref 1.7–7.7)
Neutrophils Relative %: 57 %
Platelets: 110 K/uL — ABNORMAL LOW (ref 150–400)
RBC: 4.12 MIL/uL — ABNORMAL LOW (ref 4.22–5.81)
RDW: 12.1 % (ref 11.5–15.5)
WBC: 8.3 K/uL (ref 4.0–10.5)
nRBC: 0 % (ref 0.0–0.2)

## 2024-09-04 LAB — URINALYSIS, W/ REFLEX TO CULTURE (INFECTION SUSPECTED)
Bilirubin Urine: NEGATIVE
Glucose, UA: NEGATIVE mg/dL
Hgb urine dipstick: NEGATIVE
Ketones, ur: NEGATIVE mg/dL
Leukocytes,Ua: NEGATIVE
Nitrite: NEGATIVE
Protein, ur: NEGATIVE mg/dL
Specific Gravity, Urine: 1.003 — ABNORMAL LOW (ref 1.005–1.030)
Squamous Epithelial / HPF: 0 /HPF (ref 0–5)
pH: 8 (ref 5.0–8.0)

## 2024-09-04 LAB — SODIUM, URINE, RANDOM: Sodium, Ur: 70 mmol/L

## 2024-09-04 LAB — COMPREHENSIVE METABOLIC PANEL WITH GFR
ALT: 52 U/L — ABNORMAL HIGH (ref 0–44)
AST: 65 U/L — ABNORMAL HIGH (ref 15–41)
Albumin: 3.9 g/dL (ref 3.5–5.0)
Alkaline Phosphatase: 78 U/L (ref 38–126)
Anion gap: 12 (ref 5–15)
BUN: <5 mg/dL — ABNORMAL LOW (ref 8–23)
CO2: 25 mmol/L (ref 22–32)
Calcium: 8.7 mg/dL — ABNORMAL LOW (ref 8.9–10.3)
Chloride: 88 mmol/L — ABNORMAL LOW (ref 98–111)
Creatinine, Ser: 0.37 mg/dL — ABNORMAL LOW (ref 0.61–1.24)
GFR, Estimated: >60 mL/min (ref >60)
Glucose, Bld: 88 mg/dL (ref 70–99)
Potassium: 3.3 mmol/L — ABNORMAL LOW (ref 3.5–5.1)
Sodium: 125 mmol/L — ABNORMAL LOW (ref 135–145)
Total Bilirubin: 1.4 mg/dL — ABNORMAL HIGH (ref 0.0–1.2)
Total Protein: 7.3 g/dL (ref 6.5–8.1)

## 2024-09-04 LAB — LIPASE, BLOOD: Lipase: 28 U/L (ref 11–51)

## 2024-09-04 LAB — SODIUM
Sodium: 126 mmol/L — ABNORMAL LOW (ref 135–145)
Sodium: 129 mmol/L — ABNORMAL LOW (ref 135–145)

## 2024-09-04 LAB — OSMOLALITY: Osmolality: 279 mosm/kg (ref 275–295)

## 2024-09-04 MED ORDER — PANTOPRAZOLE SODIUM 40 MG IV SOLR
40.0000 mg | Freq: Once | INTRAVENOUS | Status: AC
  Administered 2024-09-04: 40 mg via INTRAVENOUS
  Filled 2024-09-04: qty 10

## 2024-09-04 MED ORDER — PHENOBARBITAL SODIUM 130 MG/ML IJ SOLN
130.0000 mg | Freq: Once | INTRAMUSCULAR | Status: AC
  Administered 2024-09-04: 130 mg via INTRAVENOUS
  Filled 2024-09-04: qty 1

## 2024-09-04 MED ORDER — VENLAFAXINE HCL 25 MG PO TABS
25.0000 mg | ORAL_TABLET | Freq: Two times a day (BID) | ORAL | Status: DC
  Administered 2024-09-04 – 2024-09-05 (×2): 25 mg via ORAL
  Filled 2024-09-04 (×4): qty 1

## 2024-09-04 MED ORDER — ALUM & MAG HYDROXIDE-SIMETH 200-200-20 MG/5ML PO SUSP
30.0000 mL | Freq: Once | ORAL | Status: AC
  Administered 2024-09-04: 30 mL via ORAL
  Filled 2024-09-04: qty 30

## 2024-09-04 MED ORDER — PANTOPRAZOLE SODIUM 40 MG PO TBEC
40.0000 mg | DELAYED_RELEASE_TABLET | Freq: Every day | ORAL | Status: DC
  Administered 2024-09-05: 40 mg via ORAL
  Filled 2024-09-04: qty 1

## 2024-09-04 MED ORDER — SODIUM CHLORIDE 0.9 % IV BOLUS: 1000.0000 mL | Freq: Once | INTRAVENOUS | Status: DC

## 2024-09-04 MED ORDER — LORAZEPAM 2 MG/ML IJ SOLN: 0.0000 mg | Freq: Two times a day (BID) | INTRAMUSCULAR | Status: DC

## 2024-09-04 MED ORDER — LORAZEPAM 2 MG PO TABS
0.0000 mg | ORAL_TABLET | Freq: Four times a day (QID) | ORAL | Status: DC
  Administered 2024-09-04: 1 mg via ORAL
  Administered 2024-09-05: 2 mg via ORAL
  Filled 2024-09-04 (×2): qty 1

## 2024-09-04 MED ORDER — SUCRALFATE 1 G PO TABS
1.0000 g | ORAL_TABLET | Freq: Three times a day (TID) | ORAL | Status: DC
  Administered 2024-09-04 – 2024-09-05 (×4): 1 g via ORAL
  Filled 2024-09-04 (×4): qty 1

## 2024-09-04 MED ORDER — ONDANSETRON HCL 4 MG/2ML IJ SOLN
4.0000 mg | Freq: Once | INTRAMUSCULAR | Status: AC
  Administered 2024-09-04: 4 mg via INTRAVENOUS
  Filled 2024-09-04: qty 2

## 2024-09-04 MED ORDER — SODIUM CHLORIDE 0.9 % IV BOLUS
500.0000 mL | Freq: Once | INTRAVENOUS | Status: AC
  Administered 2024-09-04: 500 mL via INTRAVENOUS

## 2024-09-04 MED ORDER — AMLODIPINE BESYLATE 5 MG PO TABS
5.0000 mg | ORAL_TABLET | Freq: Every day | ORAL | Status: DC
  Administered 2024-09-04 – 2024-09-05 (×2): 5 mg via ORAL
  Filled 2024-09-04 (×2): qty 1

## 2024-09-04 MED ORDER — ATORVASTATIN CALCIUM 10 MG PO TABS
10.0000 mg | ORAL_TABLET | Freq: Every day | ORAL | Status: DC
  Administered 2024-09-05: 10 mg via ORAL
  Filled 2024-09-04: qty 1

## 2024-09-04 MED ORDER — PHENOBARBITAL SODIUM 130 MG/ML IJ SOLN: 260.0000 mg | Freq: Once | INTRAMUSCULAR | Status: DC

## 2024-09-04 MED ORDER — THIAMINE MONONITRATE 100 MG PO TABS
100.0000 mg | ORAL_TABLET | Freq: Every day | ORAL | Status: DC
Start: 2024-09-04 — End: 2024-09-05
  Administered 2024-09-05: 100 mg via ORAL
  Filled 2024-09-04: qty 1

## 2024-09-04 MED ORDER — MORPHINE SULFATE (PF) 2 MG/ML IV SOLN
2.0000 mg | Freq: Once | INTRAVENOUS | Status: AC
  Administered 2024-09-04: 2 mg via INTRAVENOUS
  Filled 2024-09-04: qty 1

## 2024-09-04 MED ORDER — LIDOCAINE VISCOUS HCL 2 % MT SOLN
15.0000 mL | Freq: Once | OROMUCOSAL | Status: AC
  Administered 2024-09-04: 15 mL via OROMUCOSAL
  Filled 2024-09-04: qty 15

## 2024-09-04 MED ORDER — THIAMINE HCL 100 MG/ML IJ SOLN
100.0000 mg | Freq: Every day | INTRAMUSCULAR | Status: DC
  Administered 2024-09-04: 100 mg via INTRAVENOUS
  Filled 2024-09-04: qty 2

## 2024-09-04 MED ORDER — MELATONIN 5 MG PO TABS
5.0000 mg | ORAL_TABLET | Freq: Every day | ORAL | Status: DC
  Administered 2024-09-04: 5 mg via ORAL
  Filled 2024-09-04: qty 1

## 2024-09-04 MED ORDER — ENOXAPARIN SODIUM 40 MG/0.4ML IJ SOSY
40.0000 mg | PREFILLED_SYRINGE | INTRAMUSCULAR | Status: DC
  Administered 2024-09-04: 40 mg via SUBCUTANEOUS
  Filled 2024-09-04: qty 0.4

## 2024-09-04 MED ORDER — ONDANSETRON HCL 4 MG/2ML IJ SOLN
4.0000 mg | Freq: Four times a day (QID) | INTRAMUSCULAR | Status: DC | PRN
  Administered 2024-09-05: 4 mg via INTRAVENOUS
  Filled 2024-09-04: qty 2

## 2024-09-04 MED ORDER — LORAZEPAM 2 MG/ML IJ SOLN: 0.0000 mg | Freq: Four times a day (QID) | INTRAMUSCULAR | Status: DC

## 2024-09-04 MED ORDER — ACETAMINOPHEN 325 MG PO TABS
650.0000 mg | ORAL_TABLET | Freq: Four times a day (QID) | ORAL | Status: DC | PRN
Start: 2024-09-04 — End: 2024-09-05
  Administered 2024-09-05: 650 mg via ORAL
  Filled 2024-09-04: qty 2

## 2024-09-04 MED ORDER — PANCRELIPASE (LIP-PROT-AMYL) 12000-38000 UNITS PO CPEP
24000.0000 [IU] | ORAL_CAPSULE | Freq: Three times a day (TID) | ORAL | Status: DC
  Administered 2024-09-04 – 2024-09-05 (×3): 24000 [IU] via ORAL
  Filled 2024-09-04 (×5): qty 2

## 2024-09-04 MED ORDER — LORAZEPAM 2 MG PO TABS: 0.0000 mg | ORAL_TABLET | Freq: Two times a day (BID) | ORAL | Status: DC

## 2024-09-04 MED ORDER — ATENOLOL 25 MG PO TABS
25.0000 mg | ORAL_TABLET | Freq: Every day | ORAL | Status: DC
  Administered 2024-09-05: 25 mg via ORAL
  Filled 2024-09-04: qty 1

## 2024-09-04 NOTE — ED Provider Notes (Signed)
 [facility] Provider Note    Event Date/Time   First MD Initiated Contact with Patient 09/04/24 [phone]     (approximate)   History   Abdominal Pain   HPI  Douglas Peterson. is a 73 year old male with history of alcohol abuse, alcoholic cirrhosis, A-fib presenting to the emergency department for evaluation of vomiting and epigastric pain.  Reports symptoms have been ongoing for weeks.  Additionally reports ongoing cough.  Reviewed recent admission from 10/15 to 7/16.  Presented with vomiting, difficulty swallowing at that time.  Had an EGD demonstrating esophagitis and hiatal hernia.  It was felt his vomiting was likely related to his alcohol abuse and reflux, recommended that patient be placed on a PPI.  Since that time has been seen in the ER multiple times including on 10/31 at which time patient had overall reassuring workup.  TOC was consulted, but did not have further resources to provide patient.  He has been evaluated in our emergency department, placed in facilities multiple times in the past, but is his own legal guardian and signs himself out.     Physical Exam   Triage Vital Signs: ED Triage Vitals  Encounter Vitals Group     BP 09/04/24 0942 (!) 153/96     Girls Systolic BP Percentile --      Girls Diastolic BP Percentile --      Boys Systolic BP Percentile --      Boys Diastolic BP Percentile --      Pulse Rate 09/04/24 0942 72     Resp 09/04/24 0942 20     Temp 09/04/24 0942 97.8 F (36.6 C)     Temp Source 09/04/24 0942 Oral     SpO2 09/04/24 0942 98 %     Weight 09/04/24 0941 116 lb 13.5 oz (53 kg)     Height 09/04/24 0941 5' 4 (1.626 m)     Head Circumference --      Peak Flow --      Pain Score --      Pain Loc --      Pain Education --      Exclude from Growth Chart --     Most recent vital signs: Vitals:   09/04/24 1445 09/04/24 1540  BP: (!) 159/83 138/77  Pulse: 70 68  Resp: 15 18  Temp:    SpO2: 94% 95%      General: Awake, interactive CV:  Good peripheral perfusion Resp:  Unlabored respirations, lung sounds mildly coarse Abd:  Nondistended, soft, mild tenderness in the epigastric region, remainder of abdomen nontender Neuro:  Symmetric facial movement, fluid speech   ED Results / Procedures / Treatments   Labs (all labs ordered are listed, but only abnormal results are displayed) Labs Reviewed  CBC WITH DIFFERENTIAL/PLATELET - Abnormal; Notable for the following components:      Result Value   RBC 4.12 (*)    Hemoglobin 12.9 (*)    HCT 38.2 (*)    Platelets 110 (*)    All other components within normal limits  COMPREHENSIVE METABOLIC PANEL WITH GFR - Abnormal; Notable for the following components:   Sodium 125 (*)    Potassium 3.3 (*)    Chloride 88 (*)    BUN <5 (*)    Creatinine, Ser 0.37 (*)    Calcium  8.7 (*)    AST 65 (*)    ALT 52 (*)    Total Bilirubin 1.4 (*)    All  other components within normal limits  URINALYSIS, W/ REFLEX TO CULTURE (INFECTION SUSPECTED) - Abnormal; Notable for the following components:   Color, Urine STRAW (*)    APPearance CLEAR (*)    Specific Gravity, Urine 1.003 (*)    Bacteria, UA RARE (*)    All other components within normal limits  OSMOLALITY, URINE - Abnormal; Notable for the following components:   Osmolality, Ur 186 (*)    All other components within normal limits  SODIUM - Abnormal; Notable for the following components:   Sodium 126 (*)    All other components within normal limits  LIPASE, BLOOD  SODIUM, URINE, RANDOM  OSMOLALITY  SODIUM     EKG EKG independently reviewed and interpreted by myself demonstrates:  EKG demonstrates sinus rhythm, significant artifact present but rate appears to be around 80 with P waves most appreciable in V5 lead strip.  Narrow QRS, QTc not significantly prolonged.  RADIOLOGY Imaging independently reviewed and interpreted by myself demonstrates:   Formal Radiology Read:  No results  found.  PROCEDURES:  Critical Care performed: No  Procedures   MEDICATIONS ORDERED IN ED: Medications  LORazepam  (ATIVAN ) injection 0-4 mg ( Intravenous See Alternative 09/04/24 1351)    Or  LORazepam  (ATIVAN ) tablet 0-4 mg (1 mg Oral Given 09/04/24 1351)  LORazepam  (ATIVAN ) injection 0-4 mg (has no administration in time range)    Or  LORazepam  (ATIVAN ) tablet 0-4 mg (has no administration in time range)  thiamine  (VITAMIN B1) tablet 100 mg ( Oral See Alternative 09/04/24 1351)    Or  thiamine  (VITAMIN B1) injection 100 mg (100 mg Intravenous Given 09/04/24 1351)  acetaminophen  (TYLENOL ) tablet 650 mg (has no administration in time range)  amLODipine  (NORVASC ) tablet 5 mg (has no administration in time range)  atenolol  (TENORMIN ) tablet 25 mg (has no administration in time range)  atorvastatin  (LIPITOR) tablet 10 mg (has no administration in time range)  venlafaxine  (EFFEXOR ) tablet 25 mg (has no administration in time range)  Pancrelipase (Lip-Prot-Amyl) 24000-76000 units CPEP 24,000 Units (has no administration in time range)  pantoprazole  (PROTONIX ) EC tablet 40 mg (has no administration in time range)  sucralfate  (CARAFATE ) tablet 1 g (has no administration in time range)  melatonin tablet 5 mg (has no administration in time range)  enoxaparin  (LOVENOX ) injection 40 mg (has no administration in time range)  ondansetron  (ZOFRAN ) injection 4 mg (has no administration in time range)  ondansetron  (ZOFRAN ) injection 4 mg (4 mg Intravenous Given 09/04/24 1123)  pantoprazole  (PROTONIX ) injection 40 mg (40 mg Intravenous Given 09/04/24 1125)  sodium chloride  0.9 % bolus 500 mL (0 mLs Intravenous Stopped 09/04/24 1403)  alum & mag hydroxide-simeth (MAALOX/MYLANTA) 200-200-20 MG/5ML suspension 30 mL (30 mLs Oral Given 09/04/24 1350)  lidocaine  (XYLOCAINE ) 2 % viscous mouth solution 15 mL (15 mLs Mouth/Throat Given 09/04/24 1350)  morphine  (PF) 2 MG/ML injection 2 mg (2 mg Intravenous Given  09/04/24 1350)     IMPRESSION / MDM / ASSESSMENT AND PLAN / ED COURSE  I reviewed the triage vital signs and the nursing notes.  Differential diagnosis includes, but is not limited to, gastritis in the setting of ongoing alcohol use, PUD, pancreatitis, lower suspicion other acute intra-abdominal process given overall reassuring abdominal exam, consideration for aspiration, pneumonia  Patient's presentation is most consistent with acute presentation with potential threat to life or bodily function.  73 year old male presenting with ongoing epigastric pain and vomiting, previously seen for similar.  Will obtain labs, treat with IV fluids, Zofran ,  Protonix .  Has had extensive recent workup, do not feel further imaging indicated at this time.  Labs demonstrated CBC with normal white blood cell count, mild anemia with hemoglobin of 12.9 but no reported acute bleeding sources.  BMP notable for significant hyponatremia with sodium of 125, down from 139 2 days ago.  Patient does report poor p.o. intake but creatinine is similar and RN did report that patient has actually had extensive urine output since being here.  Given acuity of his hyponatremia, do think admission is reasonable.  I do note that patient has previously experienced hyponatremia with sodium as low as 116 in 2024.  Discussed results of workup with patient.  He is agreeable to admission.  Will reach out to hospitalist team. Clinical Course as of 09/04/24 1543  Sun Sep 04, 2024  1334 Case discussed with hospitalist team.  They will evaluate for anticipated admission. [NR]    Clinical Course User Index [NR] Levander Slate, MD     FINAL CLINICAL IMPRESSION(S) / ED DIAGNOSES   Final diagnoses:  Hyponatremia  Epigastric pain     Rx / DC Orders   ED Discharge Orders     None        Note:  This document was prepared using Dragon voice recognition software and may include unintentional dictation errors.   Levander Slate, MD 09/04/24  [phone]

## 2024-09-04 NOTE — ED Triage Notes (Signed)
 Pstient was here yesterday for alcohol intoxication epigastric pain. Patient lives alone at home but there is feces and empty beers cans and urine on the floor.  Uses a wheelchair and has mobile delivery order to deliver beer, Reports feels he can't swallow and he can't eat.  Reports he keeps coughing and thinks its from his acid reflux.

## 2024-09-04 NOTE — ED Notes (Signed)
 Attempted IV start x 2 we not success.  IV team consult placed.
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
## 2024-09-04 NOTE — ED Notes (Signed)
One unsuccessful IV attempt by this RN.

## 2024-09-04 NOTE — ED Notes (Signed)
 Patients right arm is contracted.  Buttock and back of thighs are red from always being in wheelchair no break down noted at this time.

## 2024-09-05 ENCOUNTER — Other Ambulatory Visit: Payer: Self-pay

## 2024-09-05 DIAGNOSIS — R1013 Epigastric pain: Secondary | ICD-10-CM

## 2024-09-05 DIAGNOSIS — E871 Hypo-osmolality and hyponatremia: Secondary | ICD-10-CM | POA: Diagnosis not present

## 2024-09-05 DIAGNOSIS — F10939 Alcohol use, unspecified with withdrawal, unspecified: Secondary | ICD-10-CM | POA: Diagnosis not present

## 2024-09-05 LAB — BASIC METABOLIC PANEL WITH GFR
Anion gap: 9 (ref 5–15)
BUN: 12 mg/dL (ref 8–23)
CO2: 24 mmol/L (ref 22–32)
Calcium: 8.4 mg/dL — ABNORMAL LOW (ref 8.9–10.3)
Chloride: 98 mmol/L (ref 98–111)
Creatinine, Ser: 0.55 mg/dL — ABNORMAL LOW (ref 0.61–1.24)
GFR, Estimated: >60 mL/min (ref >60)
Glucose, Bld: 93 mg/dL (ref 70–99)
Potassium: 3.8 mmol/L (ref 3.5–5.1)
Sodium: 131 mmol/L — ABNORMAL LOW (ref 135–145)

## 2024-09-05 LAB — CBC
HCT: 35.2 % — ABNORMAL LOW (ref 39.0–52.0)
Hemoglobin: 12.4 g/dL — ABNORMAL LOW (ref 13.0–17.0)
MCH: 32.1 pg (ref 26.0–34.0)
MCHC: 35.2 g/dL (ref 30.0–36.0)
MCV: 91.2 fL (ref 80.0–100.0)
Platelets: 94 K/uL — ABNORMAL LOW (ref 150–400)
RBC: 3.86 MIL/uL — ABNORMAL LOW (ref 4.22–5.81)
RDW: 12.2 % (ref 11.5–15.5)
WBC: 10.5 K/uL (ref 4.0–10.5)
nRBC: 0 % (ref 0.0–0.2)

## 2024-09-05 MED ORDER — PANTOPRAZOLE SODIUM 40 MG PO TBEC
40.0000 mg | DELAYED_RELEASE_TABLET | Freq: Two times a day (BID) | ORAL | 1 refills | Status: AC
  Filled 2024-09-05: qty 60, 30d supply, fill #0

## 2024-09-05 MED ORDER — SUCRALFATE 1 G PO TABS
1.0000 g | ORAL_TABLET | Freq: Three times a day (TID) | ORAL | 1 refills | Status: DC
  Filled 2024-09-05: qty 60, 15d supply, fill #0
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
## 2024-09-05 NOTE — Plan of Care (Signed)
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
## 2024-09-07 ENCOUNTER — Emergency Department
Admission: EM | Admit: 2024-09-07 | Discharge: 2024-09-09 | Disposition: A | Discharge status: Home / Self Care | Attending: Emergency Medicine | Admitting: Emergency Medicine

## 2024-09-07 ENCOUNTER — Other Ambulatory Visit: Payer: Self-pay

## 2024-09-07 ENCOUNTER — Emergency Department

## 2024-09-07 DIAGNOSIS — I10 Essential (primary) hypertension: Secondary | ICD-10-CM | POA: Diagnosis not present

## 2024-09-07 DIAGNOSIS — R519 Headache, unspecified: Secondary | ICD-10-CM | POA: Diagnosis present

## 2024-09-07 DIAGNOSIS — F1010 Alcohol abuse, uncomplicated: Secondary | ICD-10-CM

## 2024-09-07 DIAGNOSIS — E162 Hypoglycemia, unspecified: Secondary | ICD-10-CM | POA: Insufficient documentation

## 2024-09-07 DIAGNOSIS — R29898 Other symptoms and signs involving the musculoskeletal system: Secondary | ICD-10-CM

## 2024-09-07 LAB — CBC WITH DIFFERENTIAL/PLATELET
Abs Immature Granulocytes: 0.02 K/uL (ref 0.00–0.07)
Basophils Absolute: 0 K/uL (ref 0.0–0.1)
Basophils Relative: 0 %
Eosinophils Absolute: 0.1 K/uL (ref 0.0–0.5)
Eosinophils Relative: 1 %
HCT: 39.7 % (ref 39.0–52.0)
Hemoglobin: 13.7 g/dL (ref 13.0–17.0)
Immature Granulocytes: 0 %
Lymphocytes Relative: 53 %
Lymphs Abs: 3.9 K/uL (ref 0.7–4.0)
MCH: 32 pg (ref 26.0–34.0)
MCHC: 34.5 g/dL (ref 30.0–36.0)
MCV: 92.8 fL (ref 80.0–100.0)
Monocytes Absolute: 0.6 K/uL (ref 0.1–1.0)
Monocytes Relative: 8 %
Neutro Abs: 2.8 K/uL (ref 1.7–7.7)
Neutrophils Relative %: 38 %
Platelets: 148 K/uL — ABNORMAL LOW (ref 150–400)
RBC: 4.28 MIL/uL (ref 4.22–5.81)
RDW: 12.5 % (ref 11.5–15.5)
WBC: 7.3 K/uL (ref 4.0–10.5)
nRBC: 0 % (ref 0.0–0.2)

## 2024-09-07 LAB — BASIC METABOLIC PANEL WITH GFR
Anion gap: 16 — ABNORMAL HIGH (ref 5–15)
BUN: 7 mg/dL — ABNORMAL LOW (ref 8–23)
CO2: 20 mmol/L — ABNORMAL LOW (ref 22–32)
Calcium: 8.7 mg/dL — ABNORMAL LOW (ref 8.9–10.3)
Chloride: 97 mmol/L — ABNORMAL LOW (ref 98–111)
Creatinine, Ser: 0.37 mg/dL — ABNORMAL LOW (ref 0.61–1.24)
GFR, Estimated: >60 mL/min (ref >60)
Glucose, Bld: 47 mg/dL — ABNORMAL LOW (ref 70–99)
Potassium: 4.2 mmol/L (ref 3.5–5.1)
Sodium: 133 mmol/L — ABNORMAL LOW (ref 135–145)

## 2024-09-07 LAB — CBG MONITORING, ED: Glucose-Capillary: 84 mg/dL (ref 70–99)

## 2024-09-07 MED ORDER — ENSURE ENLIVE PO LIQD
237.0000 mL | Freq: Two times a day (BID) | ORAL | Status: DC
  Administered 2024-09-09: 237 mL via ORAL
  Filled 2024-09-07: qty 237

## 2024-09-07 MED ORDER — ATORVASTATIN CALCIUM 20 MG PO TABS
10.0000 mg | ORAL_TABLET | Freq: Every day | ORAL | Status: DC
  Administered 2024-09-08 – 2024-09-09 (×2): 10 mg via ORAL
  Filled 2024-09-07 (×2): qty 1

## 2024-09-07 MED ORDER — PANCRELIPASE (LIP-PROT-AMYL) 12000-38000 UNITS PO CPEP
24000.0000 [IU] | ORAL_CAPSULE | Freq: Three times a day (TID) | ORAL | Status: DC
  Administered 2024-09-08 – 2024-09-09 (×5): 24000 [IU] via ORAL
  Filled 2024-09-07 (×6): qty 2

## 2024-09-07 MED ORDER — THIAMINE HCL 100 MG PO TABS
100.0000 mg | ORAL_TABLET | Freq: Every day | ORAL | Status: DC
  Administered 2024-09-08 – 2024-09-09 (×2): 100 mg via ORAL
  Filled 2024-09-07 (×4): qty 1

## 2024-09-07 MED ORDER — SUCRALFATE 1 G PO TABS
1.0000 g | ORAL_TABLET | Freq: Three times a day (TID) | ORAL | Status: DC
Start: 2024-09-08 — End: 2024-09-09
  Administered 2024-09-08 – 2024-09-09 (×6): 1 g via ORAL
  Filled 2024-09-07 (×6): qty 1

## 2024-09-07 MED ORDER — ATENOLOL 25 MG PO TABS
25.0000 mg | ORAL_TABLET | Freq: Every day | ORAL | Status: DC
  Administered 2024-09-08 – 2024-09-09 (×2): 25 mg via ORAL
  Filled 2024-09-07 (×2): qty 1

## 2024-09-07 MED ORDER — AMLODIPINE BESYLATE 5 MG PO TABS
5.0000 mg | ORAL_TABLET | Freq: Every day | ORAL | Status: DC
  Administered 2024-09-08 – 2024-09-09 (×2): 5 mg via ORAL
  Filled 2024-09-07 (×2): qty 1

## 2024-09-07 MED ORDER — VENLAFAXINE HCL 25 MG PO TABS
25.0000 mg | ORAL_TABLET | Freq: Two times a day (BID) | ORAL | Status: DC
  Administered 2024-09-08 – 2024-09-09 (×4): 25 mg via ORAL
  Filled 2024-09-07 (×4): qty 1

## 2024-09-07 MED ORDER — ACETAMINOPHEN 325 MG PO TABS
650.0000 mg | ORAL_TABLET | Freq: Once | ORAL | Status: AC
  Administered 2024-09-07: 650 mg via ORAL
  Filled 2024-09-07: qty 2

## 2024-09-07 MED ORDER — ADULT MULTIVITAMIN W/MINERALS CH
1.0000 | ORAL_TABLET | Freq: Every day | ORAL | Status: DC
  Administered 2024-09-08 – 2024-09-09 (×2): 1 via ORAL
  Filled 2024-09-07 (×2): qty 1

## 2024-09-07 MED ORDER — PANTOPRAZOLE SODIUM 40 MG PO TBEC
40.0000 mg | DELAYED_RELEASE_TABLET | Freq: Two times a day (BID) | ORAL | Status: DC
  Administered 2024-09-08 – 2024-09-09 (×4): 40 mg via ORAL
  Filled 2024-09-07 (×4): qty 1

## 2024-09-07 MED ORDER — FOLIC ACID 1 MG PO TABS
1.0000 mg | ORAL_TABLET | Freq: Every day | ORAL | Status: DC
  Administered 2024-09-08 – 2024-09-09 (×2): 1 mg via ORAL
  Filled 2024-09-07 (×2): qty 1

## 2024-09-07 NOTE — ED Notes (Signed)
Pt given PM snack at this time.  

## 2024-09-07 NOTE — Discharge Instructions (Addendum)
 [facility]  [phone]

## 2024-09-07 NOTE — ED Notes (Signed)
 IV team at bedside

## 2024-09-07 NOTE — ED Notes (Signed)
 Lab reported unsuccessful with blood draw and is sending another team member.

## 2024-09-07 NOTE — ED Notes (Signed)
Pt attempting to call for ride

## 2024-09-07 NOTE — ED Notes (Signed)
 Pt with soiled brief. Pt changed. Pt provided urinal

## 2024-09-07 NOTE — ED Notes (Signed)
 Pt given orange juice and instructed to drink due to CBG 47 - awaiting IV access

## 2024-09-07 NOTE — ED Provider Notes (Signed)
 [facility] Provider Note    Event Date/Time   First MD Initiated Contact with Patient 09/07/24 1032     (approximate)   History   Headache   HPI  Douglas Peterson. is a 73 year old male with history of alcohol abuse, cirrhosis, A-fib presenting to the emergency department for evaluation of headache.  Recently seen by myself in our ER on 11/2, admitted for hyponatremia in the setting of weakness at that time.  Had improvement and was discharged on 11/3.  Today, patient presents with headache since yesterday with associated nausea.  Unsure if he has had any head trauma.  Denies numbness, tingling, focal weakness.     Physical Exam   Triage Vital Signs: ED Triage Vitals [09/07/24 1047]  Encounter Vitals Group     BP 135/71     Girls Systolic BP Percentile      Girls Diastolic BP Percentile      Boys Systolic BP Percentile      Boys Diastolic BP Percentile      Pulse Rate 76     Resp 18     Temp 98.5 F (36.9 C)     Temp Source Oral     SpO2 95 %     Weight      Height      Head Circumference      Peak Flow      Pain Score 7     Pain Loc      Pain Education      Exclude from Growth Chart     Most recent vital signs: Vitals:   09/07/24 1047  BP: 135/71  Pulse: 76  Resp: 18  Temp: 98.5 F (36.9 C)  SpO2: 95%     General: Awake, interactive  Head:  No visible trauma  CV:  Good peripheral perfusion Resp:  Unlabored respirations Abd:  Nondistended.  Neuro:  Symmetric facial movement, fluid speech, contracture of right upper extremity that is chronic, 5 out of 5 strength of the left upper and bilateral lower extremities with intact sensation   ED Results / Procedures / Treatments   Labs (all labs ordered are listed, but only abnormal results are displayed) Labs Reviewed  CBC WITH DIFFERENTIAL/PLATELET - Abnormal; Notable for the following components:      Result Value   Platelets 148 (*)    All other components within normal  limits  BASIC METABOLIC PANEL WITH GFR - Abnormal; Notable for the following components:   Sodium 133 (*)    Chloride 97 (*)    CO2 20 (*)    Glucose, Bld 47 (*)    BUN 7 (*)    Creatinine, Ser 0.37 (*)    Calcium  8.7 (*)    Anion gap 16 (*)    All other components within normal limits     EKG EKG independently reviewed and interpreted by myself demonstrates:    RADIOLOGY Imaging independently reviewed and interpreted by myself demonstrates:  CT head without acute bleed CT C-spine without acute fracture  Formal Radiology Read:  CT Cervical Spine Wo Contrast Result Date: 09/07/2024 EXAM: CT CERVICAL SPINE WITHOUT CONTRAST 09/07/2024 12:17:02 PM TECHNIQUE: CT of the cervical spine was performed without the administration of intravenous contrast. Multiplanar reformatted images are provided for review. Automated exposure control, iterative reconstruction, and/or weight based adjustment of the mA/kV was utilized to reduce the radiation dose to as low as reasonably achievable. COMPARISON: Head CT 09/07/2024, reported separately. Cervical spine CT 08/31/2024.  CLINICAL HISTORY: 73 year old male with neck trauma. FINDINGS: CERVICAL SPINE: BONES AND ALIGNMENT: No acute fracture or traumatic malalignment. Partially visible chronic left clavicle fracture. DEGENERATIVE CHANGES: Chronic degenerative appearing interbody ankylosis C4-C5 and C5-C6. Chronic disc and endplate degeneration at C6-C7. Chronic moderate to severe upper cervical facet arthropathy on the right above the fused segments. Asymmetric chronic right occipital condyle-C1 degeneration with osteophytosis. Chronic anterior C1-C2 degeneration. Multifactorial degenerative cervical spinal stenosis appears maximal at C3-C4, possibly moderate, stable. SOFT TISSUES: No prevertebral soft tissue swelling. Chronic deformity right thyroid  cartilage appears stable and benign. IMPRESSION: 1. No acute traumatic injury identified in the cervical spine. 2.  Stable chronic cervical spine degeneration superimposed on degenerative ankylosis C4 through C6. Electronically signed by: Helayne Hurst MD 09/07/2024 12:54 PM EST RP Workstation: [id number]   CT Head Wo Contrast Result Date: 09/07/2024 EXAM: CT HEAD WITHOUT CONTRAST 09/07/2024 12:17:02 PM TECHNIQUE: CT of the head was performed without the administration of intravenous contrast. Automated exposure control, iterative reconstruction, and/or weight based adjustment of the mA/kV was utilized to reduce the radiation dose to as low as reasonably achievable. COMPARISON: Brain MRI 05/23/2020. Head CT 08/31/2024. CLINICAL HISTORY: 73 year old male with minor head trauma. FINDINGS: BRAIN AND VENTRICLES: No acute hemorrhage. No evidence of acute infarct. No hydrocephalus. No extra-axial collection. No mass effect or midline shift. Stable brain volume, with disproportionate appearance of brainstem and cerebellar atrophy as described on 2021 MRI. Periventricular white matter hypodensity is mild for age and stable since that time. Calcified atherosclerosis at the skull base. No suspicious intracranial vascular hyperdensity. ORBITS: No acute abnormality. SINUSES: Paranasal sinuses, middle ears and mastoids remain well aerated. SOFT TISSUES AND SKULL: No acute soft tissue abnormality. No skull fracture. IMPRESSION: 1. No acute intracranial abnormality. 2. Suspicion of chronic disproportionate brainstem and cerebellar atrophy, as on the 05/23/2020 MRI, nonspecific. Electronically signed by: Helayne Hurst MD 09/07/2024 12:51 PM EST RP Workstation: [id number]    PROCEDURES:  Critical Care performed: No  Procedures   MEDICATIONS ORDERED IN ED: Medications  acetaminophen  (TYLENOL ) tablet 650 mg (650 mg Oral Given 09/07/24 1129)     IMPRESSION / MDM / ASSESSMENT AND PLAN / ED COURSE  I reviewed the triage vital signs and the nursing notes.  Differential diagnosis includes, but is not limited to, intracranial bleed,  skull fracture, spine fracture, anemia, electrolyte abnormality  Patient's presentation is most consistent with acute presentation with potential threat to life or bodily function.  73 year old male presenting to the emergency department for evaluation of headache, recent admission for hyponatremia.  Stable vitals on presentation.  CT head and C-spine without acute traumatic findings.  No appreciable new focal deficits on exam.  Will obtain labs to further evaluate.  Clinical Course as of 09/07/24 1532  Wed Sep 07, 2024  1517 Glucose(!): 47 Glucose low on BMP.  Difficult IV access, will provide oral medication to help with patient's glucose, repeat in 30 minutes.  Signed out to oncoming physician pending reassessment and disposition. [NR]    Clinical Course User Index [NR] Levander Slate, MD     FINAL CLINICAL IMPRESSION(S) / ED DIAGNOSES   Final diagnoses:  Acute nonintractable headache, unspecified headache type  Hypoglycemia     Rx / DC Orders   ED Discharge Orders     None        Note:  This document was prepared using Dragon voice recognition software and may include unintentional dictation errors.   Levander Slate, MD 09/07/24 [phone]

## 2024-09-07 NOTE — ED Triage Notes (Signed)
 Pt to ED via [facility] from home. Pt reports worsening HA since yesterday with nausea. Pt daily etoh use. Pt reports drank a few beers last night and much less that normal. Denies eoth use today. Pt presents in blue hospital scrubs and wristband from last being seen. Clothes soiled with urine. Pt cleaned and provided clean scrubs on arrival.

## 2024-09-07 NOTE — ED Provider Notes (Signed)
 Patient glucose is reassuring, sodium in line with prior levels.  He appears to be at his baseline, known to our department, appropriate for discharge at this time, no indication for admission.   Arlander Charleston, MD 09/07/24 1556

## 2024-09-07 NOTE — ED Notes (Signed)
 Lab called again in regard to blood work still being uncollected

## 2024-09-07 NOTE — ED Notes (Signed)
 RN attempted blood draw without success. Lab called for blood draw

## 2024-09-08 DIAGNOSIS — R1032 Left lower quadrant pain: Secondary | ICD-10-CM | POA: Diagnosis not present

## 2024-09-08 DIAGNOSIS — K5641 Fecal impaction: Secondary | ICD-10-CM | POA: Diagnosis not present

## 2024-09-08 NOTE — TOC Progression Note (Addendum)
 Transition of Care (TOC) - Progression Note    Patient Details  Name: Douglas Peterson. MRN: [id number] Date of Birth: 11-23-1950  Transition of Care [facility]) CM/SW Contact  Mahagony Grieb L Aydrian [place], [place] Phone Number: 09/08/2024, 2:19 PM  Clinical Narrative:     CSW met with patient. DSS APS SW's, Harlene Iba and Clifm Marsh were with the patient as well. Patient advised that he would return to [facility] for a month. [name] not making appropriate decisions for himself. Home Health services discussed as patient continued to advise that he is discharging home.   APS SW, Harlene Iba, advised patient that if this behavior continues, DSS will pursue guardianship again. Harlene advised that DSS has filed GSHIP for patient several times and each time he comes to court with an attorney and sober. Harlene advised that a home visit will be completed with patient after discharge to discuss options. DSS was involved with patient prior to today however, CSW made another APS report and both SW's came to respond to new concerns.   Patient offered choice and he advised that he would like [facility] to provide HHA services in the home. He stated that he needed someone to help clean. Harlene advised that he would have to pay for help privately if he needed someone to assist with cleaning. Patient agreed to pursue private options.   HHA set up with [facility]. CSW and patient discussed choices and patient denied that he is going to discharge home and go on a drinking binge. Patient advised that he did not want DSS in his business. 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   Additional resources added to the AVS. DSS in agreement to discharge patient home with home health. They advised patient that this is what is needed to avoid GSHIP. It was made clear to patient by DSS that if GSHIP is obtained, patient will go to a  facility.                     Expected Discharge Plan and Services                                               Social Drivers of Health (SDOH) Interventions SDOH Screenings   Food Insecurity: No Food Insecurity (09/04/2024)  Housing: Low Risk  (09/04/2024)  Transportation Needs: No Transportation Needs (09/04/2024)  Utilities: Not At Risk (09/04/2024)  Financial Resource Strain: Unknown (06/13/2019)  Physical Activity: Unknown (06/13/2019)  Social Connections: Socially Isolated (09/04/2024)  Stress: Unknown (06/13/2019)  Tobacco Use: Medium Risk (09/07/2024)    Readmission Risk Interventions    04/11/2024    5:11 PM 01/07/2024   11:31 AM 06/17/2023    4:23 PM  Readmission Risk Prevention Plan  Transportation Screening -- Complete Complete  HRI or Home Care Consult   Complete  Social Work Consult for Recovery Care Planning/Counseling   Complete  Palliative Care Screening   Not Applicable  Medication Review Oceanographer) -- Complete Complete  PCP or Specialist appointment within 3-5 days of discharge  Complete   HRI or Home Care Consult Patient refused Complete   SW Recovery Care/Counseling Consult Patient refused Complete   Palliative Care Screening Patient Refused Not Applicable   Skilled Nursing Facility --  Not Applicable

## 2024-09-08 NOTE — ED Notes (Signed)
 Patient cleaned with incontinent cleanser and wipes. New brief, pad, and scrubs placed on patient. Patient required full assistance

## 2024-09-08 NOTE — ED Provider Notes (Signed)
-----------------------------------------   4:07 AM on 09/08/2024 -----------------------------------------   Blood pressure (!) 140/75, pulse 79, temperature 98.3 F (36.8 C), temperature source Oral, resp. rate 16, SpO2 95%.  The patient is calm and cooperative at this time.  There have been no acute events since the last update.  Awaiting disposition plan from case management/social work.    Bobbe Quilter, Josette SAILOR, DO 09/08/24 [phone]

## 2024-09-08 NOTE — ED Notes (Signed)
 Cab company refusing to take patient home in cab due to safety

## 2024-09-08 NOTE — ED Notes (Signed)
Vol / pending discharge 

## 2024-09-08 NOTE — ED Notes (Signed)
Pt provided with breakfast tray and beverage

## 2024-09-08 NOTE — ED Notes (Signed)
 Gave PT dinner.

## 2024-09-08 NOTE — Evaluation (Signed)
 Physical Therapy Evaluation Patient Details Name: Douglas Peterson. MRN: [id number] DOB: 1951/07/20 Today's Date: 09/08/2024  History of Present Illness  Douglas Peterson. is a 73 y.o. male with medical history significant for paroxysmal A-fib not on AC, HTN, spastic paralysis right upper extremity,alcohol use disorder ,  numerous hospitalizations for alcohol-related issues mostly related to abdominal pain/ vomiting/hyponatremia /alcohol withdrawal,   multiple/recurrent ED visits.  Clinical Impression  Pt pleased to see this PT (well know to PT) and eager to get up and walk and show his mobility.  He was able to ambulate >100 ft on 2 separate occasions w/o issue, with baseline SPC.  He managed bed mobility well, independently managed shoes and donned them sitting EOB and ultimately was not too far from his baseline activity tolerance and safety.  [name] vitals stable to prolonged walks, baseline cognition and eager to return home.  Pt will benefit from continued PT to address functional limitations.      If plan is discharge home, recommend the following: Assist for transportation;Assistance with cooking/housework   Can travel by private vehicle   No    Equipment Recommendations None recommended by PT  Recommendations for Other Services       Functional Status Assessment Patient has had a recent decline in their functional status and demonstrates the ability to make significant improvements in function in a reasonable and predictable amount of time.     Precautions / Restrictions Precautions Precautions: Fall Restrictions Weight Bearing Restrictions Per Provider Order: No      Mobility  Bed Mobility Overal bed mobility: Modified Independent Bed Mobility: Supine to Sit, Sit to Supine     Supine to sit: Modified independent (Device/Increase time)     General bed mobility comments: good efforts, pt is able to reach for bed rail and initiate motor planning to move BLE off the bed     Transfers Overall transfer level: Modified independent Equipment used: Straight cane Transfers: Sit to/from Stand Sit to Stand: Supervision           General transfer comment: able to transition to/from very low bed many times t/o session w/o need for assist    Ambulation/Gait Ambulation/Gait assistance: Supervision Gait Distance (Feet): 125 Feet Assistive device: Straight cane         General Gait Details: 125 ft X 2, Pt eager and able to do prolonged bout of ambulation, consistent but slow cadence with appropriate SPC use.  Cuing t/o for AD use, postural and knee positioning awareness.  HR remains in the 60s and O2 in the 90s with the effort, able to hold conversation t/o  Careers Information Officer     Tilt Bed    Modified Rankin (Stroke Patients Only)       Balance Overall balance assessment: Needs assistance Sitting-balance support: No upper extremity supported, Feet supported Sitting balance-Leahy Scale: Good Sitting balance - Comments: occasional lean back and to the R, but self corrects with limited cues     Standing balance-Leahy Scale: Good Standing balance comment: relatively steady with SPC during standing/ambulation tasks                             Pertinent Vitals/Pain Pain Assessment Pain Assessment: No/denies pain    Home Living Family/patient expects to be discharged to:: Private residence Living Arrangements: Alone Available Help at Discharge: Neighbor;Available PRN/intermittently Type of Home: House Home  Access: Stairs to enter Entrance Stairs-Rails: Can reach both Entrance Stairs-Number of Steps: 3   Home Layout: One level Home Equipment: Cane - single point;Wheelchair - manual      Prior Function Prior Level of Function : Independent/Modified Independent             Mobility Comments: Pt reports use of SPC for short distance indoor mobility. Also reports transferring to/from manual wheelchair  and uses it for mobility at times. ADLs Comments: Pt endorses having more difficulty taking care of himself recently, groceries delivered, neighbor helps with transport PRN     Extremity/Trunk Assessment   Upper Extremity Assessment RUE Deficits / Details: hx of R UE contracture    Lower Extremity Assessment Lower Extremity Assessment: Overall WFL for tasks assessed;Generalized weakness       Communication   Communication Communication: Impaired Factors Affecting Communication:  (soft voice, baseline)    Cognition Arousal: Alert Behavior During Therapy: WFL for tasks assessed/performed   PT - Cognitive impairments: No apparent impairments                       PT - Cognition Comments: baseline, well known to this PT Following commands: Intact       Cueing Cueing Techniques: Verbal cues     General Comments General comments (skin integrity, edema, etc.): able to re-thread velcro straps and don shoes w/o assist while sitting EOB    Exercises     Assessment/Plan    PT Assessment Patient needs continued PT services  PT Problem List Decreased activity tolerance;Decreased safety awareness;Decreased strength;Decreased balance;Decreased mobility       PT Treatment Interventions DME instruction;Gait training;Stair training;Functional mobility training;Therapeutic activities;Therapeutic exercise;Balance training;Patient/family education    PT Goals (Current goals can be found in the Care Plan section)  Acute Rehab PT Goals Patient Stated Goal: go home (Not going to rehab) PT Goal Formulation: With patient Time For Goal Achievement: 09/21/24 Potential to Achieve Goals: Good    Frequency Min 1X/week     Co-evaluation               AM-PAC PT 6 Clicks Mobility  Outcome Measure Help needed turning from your back to your side while in a flat bed without using bedrails?: None Help needed moving from lying on your back to sitting on the side of a flat  bed without using bedrails?: None Help needed moving to and from a bed to a chair (including a wheelchair)?: None Help needed standing up from a chair using your arms (e.g., wheelchair or bedside chair)?: None Help needed to walk in hospital room?: A Little Help needed climbing 3-5 steps with a railing? : A Little 6 Click Score: 22    End of Session Equipment Utilized During Treatment: Gait belt Activity Tolerance: Patient tolerated treatment well Patient left: with bed alarm set;with call bell/phone within reach Nurse Communication: Mobility status PT Visit Diagnosis: Muscle weakness (generalized) (M62.81);Unsteadiness on feet (R26.81);Ataxic gait (R26.0);Hemiplegia and hemiparesis Hemiplegia - Right/Left: Right Hemiplegia - dominant/non-dominant: Dominant Hemiplegia - caused by: Cerebral infarction    Time: 1010-1029 PT Time Calculation (min) (ACUTE ONLY): 19 min   Charges:   PT Evaluation $PT Eval Low Complexity: 1 Low PT Treatments $Gait Training: 8-22 mins PT General Charges $$ ACUTE PT VISIT: 1 Visit         Carmin JONELLE Deed, DPT 09/08/2024, 11:49 AM

## 2024-09-08 NOTE — TOC Initial Note (Signed)
 Transition of Care (TOC) - Initial/Assessment Note    Patient Details  Name: Douglas Peterson. MRN: [id number] Date of Birth: 1951/08/23  Transition of Care [facility]) CM/SW Contact:    Lillyn Wieczorek L Suleyman Ehrman, LCSW Phone Number: 09/08/2024, 10:13 AM  Clinical Narrative:                  [facility] consult received for SNF placement. Chart and history reviewed. [name] an alcoholic. A psych consult is needed as patient is intentionally causing self-harm which is a detriment to his overall well-being. Patient has a history of discharging himself from SNF placement, going home to drink and coming back to the ED.   SNF workup on hold. An APS report has been made.        Patient Goals and CMS Choice            Expected Discharge Plan and Services                                              Prior Living Arrangements/Services                       Activities of Daily Living      Permission Sought/Granted                  Emotional Assessment              Admission diagnosis:  Headache Patient Active Problem List   Diagnosis Date Noted   Acute esophagitis 08/16/2024   Elevated troponin 08/15/2024   Head injury 03/25/2024   Alcohol withdrawal (HCC) 03/19/2024   Incarcerated hernia 01/27/2024   S/P laparoscopic hernia repair 01/27/2024   Intractable nausea and vomiting 10/06/2023   Protein-calorie malnutrition, severe 08/20/2023   Ground-level fall 08/16/2023   GERD without esophagitis 08/06/2023   Nausea 07/19/2023   Pressure injury of skin 07/11/2023   Alcohol abuse 07/11/2023   Abdominal pain 06/16/2023   Elevated lactic acid level 06/16/2023   Chronic diastolic CHF (congestive heart failure) (HCC) 06/16/2023   Increased urinary frequency 06/08/2023   Diarrhea 06/08/2023   Upper respiratory infection 06/04/2023   Enteritis 06/02/2023   Polyuria 06/02/2023   Dyslipidemia 05/01/2023   Alcohol dependence (HCC) 05/01/2023   Maggot infestation  feet 05/11/2022   Unable to care for self 05/11/2022   Debility 04/25/2022   Weakness 04/23/2022   Epistaxis 04/23/2022   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   Generalized weakness 03/18/2022   Nondisplaced fracture of shaft of left clavicle, initial encounter for closed fracture 03/18/2022   BPH (benign prostatic hyperplasia) 03/18/2022   Clavicle fracture 03/17/2022   Fall 03/17/2022   CAP (community acquired pneumonia) 03/13/2022   Altered mental status, unspecified 02/20/2022   Hypotension 02/19/2022   Dysphonia 08/13/2021   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   Paresthesia of right upper extremity 08/13/2021   SIRS (systemic inflammatory response syndrome) (HCC) 08/05/2020   GERD (gastroesophageal reflux disease) 06/05/2020   Alcohol use disorder, severe, dependence (HCC) 05/28/2020   Elevated LFTs 04/06/2020   Paroxysmal A-fib (HCC) 03/17/2020   Primary insomnia 02/06/2020   Left inguinal hernia 01/31/2020   Encounter for competency evaluation    Depression 01/17/2020   Pancytopenia (HCC)    Alcoholic cirrhosis of liver without ascites (HCC)    ETOH abuse    Alcohol intoxication  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) 09/27/2019   Hypokalemia 09/27/2019   Dysphagia 09/27/2019   Alcohol withdrawal delirium, acute, hyperactive (HCC) 09/21/2019   Pressure injury of ankle, stage 1 08/15/2019   Palliative care by specialist    Closed fracture of right proximal humerus 03/19/2019   Vitamin D  deficiency 01/11/2019   Osteoporosis 12/22/2018   Closed nondisplaced fracture of acromial process of right scapula with routine healing 11/15/2018   Compression fracture of L2 vertebra with routine healing 11/15/2018   Delirium tremens (HCC) 03/08/2018   Essential hypertension 09/16/2017   Encephalopathy, 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   Steatohepatitis 12/03/2016   Abnormal liver enzymes 06/17/2016   Hereditary hemochromatosis 06/17/2016   Anxiety  07/16/2015   Hyponatremia 07/09/2015   H/O traumatic brain injury 05/22/2014   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HCC) 05/22/2014   PCP:  Pcp, No Pharmacy:   [facility], [place] - [address] [place] [address] Phone: [phone] Fax: [phone]     Social Drivers of Health (SDOH) Social History: SDOH Screenings   Food Insecurity: No Food Insecurity (09/04/2024)  Housing: Low Risk  (09/04/2024)  Transportation Needs: No Transportation Needs (09/04/2024)  Utilities: Not At Risk (09/04/2024)  Financial Resource Strain: Unknown (06/13/2019)  Physical Activity: Unknown (06/13/2019)  Social Connections: Socially Isolated (09/04/2024)  Stress: Unknown (06/13/2019)  Tobacco Use: Medium Risk (09/07/2024)   SDOH Interventions:     Readmission Risk Interventions    04/11/2024    5:11 PM 01/07/2024   11:31 AM 06/17/2023    4:23 PM  Readmission Risk Prevention Plan  Transportation Screening -- Complete Complete  HRI or Home Care Consult   Complete  Social Work Consult for Recovery Care Planning/Counseling   Complete  Palliative Care Screening   Not Applicable  Medication Review Oceanographer) -- Complete Complete  PCP or Specialist appointment within 3-5 days of discharge  Complete   HRI or Home Care Consult Patient refused Complete   SW Recovery Care/Counseling Consult Patient refused Complete   Palliative Care Screening Patient Refused Not Applicable   Skilled Nursing Facility -- Not Applicable

## 2024-09-08 NOTE — ED Notes (Signed)
vol

## 2024-09-08 NOTE — ED Notes (Signed)
Pt given lunch tray and beverage 

## 2024-09-08 NOTE — ED Notes (Signed)
 PT at bedside with patient

## 2024-09-08 NOTE — ED Provider Notes (Signed)
 Procedures  Clinical Course as of 09/08/24 1420  Wed Sep 07, 2024  1517 Glucose(!): 47 Glucose low on BMP.  Difficult IV access, will provide oral medication to help with patient's glucose, repeat in 30 minutes.  Signed out to oncoming physician pending reassessment and disposition. [NR]    Clinical Course User Index [NR] Levander Slate, MD    ----------------------------------------- 2:20 PM on 09/08/2024 ----------------------------------------- PT recommends home health, finds patient at his functional baseline for which she has been evaluated multiple times in the past and refused SNF placement.  He refuses SNF today.  Discussed with social work who has arranged home health services and also communicated with DSS.     Viviann Pastor, MD 09/08/24 1420

## 2024-09-09 NOTE — Progress Notes (Signed)
 Physical Therapy Treatment Patient Details Name: Douglas Peterson. MRN: [id number] DOB: 05-Sep-1951 Today's Date: 09/09/2024   History of Present Illness Douglas Peterson. is a 73 y.o. male with medical history significant for paroxysmal A-fib not on AC, HTN, spastic paralysis right upper extremity,alcohol use disorder ,  numerous hospitalizations for alcohol-related issues mostly related to abdominal pain/ vomiting/hyponatremia /alcohol withdrawal,   multiple/recurrent ED visits.    PT Comments  Pt received supine in bed agreeable to PT services. Pt mod-I with bed mobility and STS from very low bed in ED. Pt completes ~400' of gait around lower block of ED at supervision. Pt does have 1 stumble at the 200' mark to the L needing minA from PT to correct however pt is at functional baseline. 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 Pt also with multiple documented declines for SNF placement requesting to return home.  Pt without further balance difficulties with remaining 200'. Pt returned to supine in bed with all needs in reach. Voiced pt concerns to RN about d/c home with neighbor versus uber as pt feels safer with neighbor due to neighbor able to assist pt into home if needed. Recommend if pt d/c via uber to ensure pt has a sedan to reduce falls risk entering/exiting vehicle.    If plan is discharge home, recommend the following: Assist for transportation;Assistance with cooking/housework   Can travel by private vehicle     No  Equipment Recommendations  None recommended by PT    Recommendations for Other Services       Precautions / Restrictions Precautions Precautions: Fall Restrictions Weight Bearing Restrictions Per Provider Order: No Other Position/Activity Restrictions: chronic RUE hemiplegia     Mobility  Bed Mobility Overal bed mobility: Modified Independent       Supine to sit: Modified independent (Device/Increase time)       Patient  Response: Cooperative  Transfers Overall transfer level: Modified independent Equipment used: Straight cane Transfers: Sit to/from Stand Sit to Stand: Independent           General transfer comment: from very low bed in ED    Ambulation/Gait Ambulation/Gait assistance: Supervision Gait Distance (Feet): 400 Feet Assistive device: Straight cane Gait Pattern/deviations: Step-through pattern, Decreased step length - right, Decreased step length - left       General Gait Details: completing whole lap around ED. Does have L lateral stumble at ~200' needing minA from PT to correct. However pt's gait and balance is at baseline for pt.   Stairs             Wheelchair Mobility     Tilt Bed Tilt Bed Patient Response: Cooperative  Modified Rankin (Stroke Patients Only)       Balance Overall balance assessment: Needs assistance Sitting-balance support: No upper extremity supported, Feet supported Sitting balance-Leahy Scale: Good       Standing balance-Leahy Scale: Good Standing balance comment: relatively steady with SPC during standing/ambulation tasks                            Communication Communication Communication: Impaired Factors Affecting Communication: Difficulty expressing self;Reduced clarity of speech (baseline for pt)  Cognition Arousal: Alert Behavior During Therapy: WFL for tasks assessed/performed   PT - Cognitive impairments: No apparent impairments  PT - Cognition Comments: baseline Following commands: Intact      Cueing Cueing Techniques: Verbal cues  Exercises      General Comments        Pertinent Vitals/Pain Pain Assessment Pain Assessment: Faces Faces Pain Scale: No hurt    Home Living                          Prior Function            PT Goals (current goals can now be found in the care plan section) Acute Rehab PT Goals Patient Stated Goal: go home (Not going  to rehab) PT Goal Formulation: With patient Time For Goal Achievement: 09/21/24 Potential to Achieve Goals: Good Progress towards PT goals: Progressing toward goals    Frequency    Min 1X/week      PT Plan      Co-evaluation              AM-PAC PT 6 Clicks Mobility   Outcome Measure  Help needed turning from your back to your side while in a flat bed without using bedrails?: None Help needed moving from lying on your back to sitting on the side of a flat bed without using bedrails?: None Help needed moving to and from a bed to a chair (including a wheelchair)?: None Help needed standing up from a chair using your arms (e.g., wheelchair or bedside chair)?: None Help needed to walk in hospital room?: A Little Help needed climbing 3-5 steps with a railing? : A Little 6 Click Score: 22    End of Session   Activity Tolerance: Patient tolerated treatment well Patient left: in bed Nurse Communication: Mobility status PT Visit Diagnosis: Muscle weakness (generalized) (M62.81);Unsteadiness on feet (R26.81);Ataxic gait (R26.0);Hemiplegia and hemiparesis Hemiplegia - Right/Left: Right Hemiplegia - dominant/non-dominant: Dominant Hemiplegia - caused by: Cerebral infarction     Time: 0950-1004 PT Time Calculation (min) (ACUTE ONLY): 14 min  Charges:    $Therapeutic Activity: 8-22 mins PT General Charges $$ ACUTE PT VISIT: 1 Visit                    Dorina HERO. Fairly IV, PT, DPT Physical Therapist-   [facility] 09/09/2024, 10:19 AM

## 2024-09-09 NOTE — ED Provider Notes (Signed)
-----------------------------------------   4:40 AM on 09/09/2024 -----------------------------------------   Blood pressure 129/64, pulse 68, temperature 98.2 F (36.8 C), temperature source Oral, resp. rate 17, SpO2 94%.  The patient is calm and cooperative at this time.  There have been no acute events since the last update.  Awaiting disposition plan from case management/social work.  Home health services have been arranged.  Patient refuses SNF placement.  Awaiting transport home.   Sherri Mcarthy, Josette SAILOR, DO 09/09/24 [phone]

## 2024-09-09 NOTE — ED Notes (Signed)
 Vol to be discharged

## 2024-09-09 NOTE — ED Notes (Signed)
 Pt stating that he defecated in his brief. No contents noted in brief at this time.
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
## 2024-09-09 NOTE — ED Notes (Signed)
 Lunch tray provided to pt.

## 2024-09-09 NOTE — ED Notes (Signed)
 Dinner tray provided to pt

## 2024-09-10 ENCOUNTER — Other Ambulatory Visit: Payer: Self-pay

## 2024-09-10 ENCOUNTER — Emergency Department

## 2024-09-10 ENCOUNTER — Inpatient Hospital Stay
Admission: EM | Admit: 2024-09-10 | Discharge: 2024-09-20 | DRG: 389 | Disposition: A | Discharge status: Skilled Nursing Facility | Attending: Emergency Medicine | Admitting: Emergency Medicine

## 2024-09-10 DIAGNOSIS — R1312 Dysphagia, oropharyngeal phase: Secondary | ICD-10-CM | POA: Diagnosis present

## 2024-09-10 DIAGNOSIS — R5381 Other malaise: Secondary | ICD-10-CM | POA: Diagnosis present

## 2024-09-10 DIAGNOSIS — Z91018 Allergy to other foods: Secondary | ICD-10-CM

## 2024-09-10 DIAGNOSIS — Z9049 Acquired absence of other specified parts of digestive tract: Secondary | ICD-10-CM

## 2024-09-10 DIAGNOSIS — Z79899 Other long term (current) drug therapy: Secondary | ICD-10-CM

## 2024-09-10 DIAGNOSIS — Z609 Problem related to social environment, unspecified: Secondary | ICD-10-CM | POA: Diagnosis present

## 2024-09-10 DIAGNOSIS — R911 Solitary pulmonary nodule: Secondary | ICD-10-CM | POA: Diagnosis present

## 2024-09-10 DIAGNOSIS — Z7901 Long term (current) use of anticoagulants: Secondary | ICD-10-CM

## 2024-09-10 DIAGNOSIS — K760 Fatty (change of) liver, not elsewhere classified: Secondary | ICD-10-CM | POA: Diagnosis present

## 2024-09-10 DIAGNOSIS — F1020 Alcohol dependence, uncomplicated: Secondary | ICD-10-CM | POA: Diagnosis present

## 2024-09-10 DIAGNOSIS — R400 Somnolence: Secondary | ICD-10-CM | POA: Diagnosis not present

## 2024-09-10 DIAGNOSIS — I110 Hypertensive heart disease with heart failure: Secondary | ICD-10-CM | POA: Diagnosis present

## 2024-09-10 DIAGNOSIS — I480 Paroxysmal atrial fibrillation: Secondary | ICD-10-CM | POA: Diagnosis present

## 2024-09-10 DIAGNOSIS — Z66 Do not resuscitate: Secondary | ICD-10-CM | POA: Diagnosis present

## 2024-09-10 DIAGNOSIS — Z87891 Personal history of nicotine dependence: Secondary | ICD-10-CM

## 2024-09-10 DIAGNOSIS — E871 Hypo-osmolality and hyponatremia: Principal | ICD-10-CM | POA: Diagnosis present

## 2024-09-10 DIAGNOSIS — R918 Other nonspecific abnormal finding of lung field: Secondary | ICD-10-CM

## 2024-09-10 DIAGNOSIS — Z87820 Personal history of traumatic brain injury: Secondary | ICD-10-CM

## 2024-09-10 DIAGNOSIS — R410 Disorientation, unspecified: Secondary | ICD-10-CM | POA: Diagnosis not present

## 2024-09-10 DIAGNOSIS — T17298A Other foreign object in pharynx causing other injury, initial encounter: Secondary | ICD-10-CM | POA: Diagnosis not present

## 2024-09-10 DIAGNOSIS — R4781 Slurred speech: Secondary | ICD-10-CM | POA: Diagnosis not present

## 2024-09-10 DIAGNOSIS — K7030 Alcoholic cirrhosis of liver without ascites: Secondary | ICD-10-CM | POA: Diagnosis present

## 2024-09-10 DIAGNOSIS — K2100 Gastro-esophageal reflux disease with esophagitis, without bleeding: Secondary | ICD-10-CM | POA: Diagnosis present

## 2024-09-10 DIAGNOSIS — R1032 Left lower quadrant pain: Secondary | ICD-10-CM

## 2024-09-10 DIAGNOSIS — W448XXA Other foreign body entering into or through a natural orifice, initial encounter: Secondary | ICD-10-CM | POA: Diagnosis not present

## 2024-09-10 DIAGNOSIS — Z888 Allergy status to other drugs, medicaments and biological substances status: Secondary | ICD-10-CM

## 2024-09-10 DIAGNOSIS — K5641 Fecal impaction: Principal | ICD-10-CM | POA: Diagnosis present

## 2024-09-10 DIAGNOSIS — K209 Esophagitis, unspecified without bleeding: Secondary | ICD-10-CM | POA: Diagnosis present

## 2024-09-10 DIAGNOSIS — N400 Enlarged prostate without lower urinary tract symptoms: Secondary | ICD-10-CM | POA: Diagnosis present

## 2024-09-10 DIAGNOSIS — R296 Repeated falls: Secondary | ICD-10-CM | POA: Diagnosis present

## 2024-09-10 DIAGNOSIS — K5289 Other specified noninfective gastroenteritis and colitis: Principal | ICD-10-CM | POA: Diagnosis present

## 2024-09-10 DIAGNOSIS — Z8249 Family history of ischemic heart disease and other diseases of the circulatory system: Secondary | ICD-10-CM

## 2024-09-10 DIAGNOSIS — K633 Ulcer of intestine: Secondary | ICD-10-CM | POA: Diagnosis present

## 2024-09-10 DIAGNOSIS — Z9181 History of falling: Secondary | ICD-10-CM

## 2024-09-10 DIAGNOSIS — I5032 Chronic diastolic (congestive) heart failure: Secondary | ICD-10-CM | POA: Diagnosis present

## 2024-09-10 DIAGNOSIS — K7581 Nonalcoholic steatohepatitis (NASH): Secondary | ICD-10-CM | POA: Diagnosis present

## 2024-09-10 DIAGNOSIS — K909 Intestinal malabsorption, unspecified: Secondary | ICD-10-CM | POA: Diagnosis present

## 2024-09-10 LAB — COMPREHENSIVE METABOLIC PANEL WITH GFR
ALT: 55 U/L — ABNORMAL HIGH (ref 0–44)
AST: 72 U/L — ABNORMAL HIGH (ref 15–41)
Albumin: 3.9 g/dL (ref 3.5–5.0)
Alkaline Phosphatase: 82 U/L (ref 38–126)
Anion gap: 15 (ref 5–15)
BUN: <5 mg/dL — ABNORMAL LOW (ref 8–23)
CO2: 21 mmol/L — ABNORMAL LOW (ref 22–32)
Calcium: 8.5 mg/dL — ABNORMAL LOW (ref 8.9–10.3)
Chloride: 88 mmol/L — ABNORMAL LOW (ref 98–111)
Creatinine, Ser: 0.42 mg/dL — ABNORMAL LOW (ref 0.61–1.24)
GFR, Estimated: >60 mL/min (ref >60)
Glucose, Bld: 87 mg/dL (ref 70–99)
Potassium: 3.6 mmol/L (ref 3.5–5.1)
Sodium: 124 mmol/L — ABNORMAL LOW (ref 135–145)
Total Bilirubin: 1.1 mg/dL (ref 0.0–1.2)
Total Protein: 8 g/dL (ref 6.5–8.1)

## 2024-09-10 LAB — PROTIME-INR
INR: 1 (ref 0.8–1.2)
Prothrombin Time: 13.8 s (ref 11.4–15.2)

## 2024-09-10 LAB — LIPASE, BLOOD: Lipase: 29 U/L (ref 11–51)

## 2024-09-10 MED ORDER — MORPHINE SULFATE (PF) 2 MG/ML IV SOLN
2.0000 mg | INTRAVENOUS | Status: DC | PRN
  Administered 2024-09-17 – 2024-09-20 (×2): 2 mg via INTRAVENOUS
  Filled 2024-09-10 (×2): qty 1

## 2024-09-10 MED ORDER — ACETAMINOPHEN 650 MG RE SUPP: 650.0000 mg | Freq: Four times a day (QID) | RECTAL | Status: DC | PRN

## 2024-09-10 MED ORDER — OXYCODONE HCL 5 MG PO TABS
5.0000 mg | ORAL_TABLET | ORAL | Status: AC
  Administered 2024-09-10: 5 mg via ORAL
  Filled 2024-09-10 (×2): qty 1

## 2024-09-10 MED ORDER — ATENOLOL 25 MG PO TABS
25.0000 mg | ORAL_TABLET | Freq: Every day | ORAL | Status: DC
  Administered 2024-09-11 – 2024-09-17 (×7): 25 mg via ORAL
  Filled 2024-09-10 (×8): qty 1

## 2024-09-10 MED ORDER — ONDANSETRON HCL 4 MG/2ML IJ SOLN: 4.0000 mg | Freq: Four times a day (QID) | INTRAMUSCULAR | Status: DC | PRN

## 2024-09-10 MED ORDER — ACETAMINOPHEN 325 MG PO TABS: 650.0000 mg | ORAL_TABLET | Freq: Four times a day (QID) | ORAL | Status: DC | PRN

## 2024-09-10 MED ORDER — ATORVASTATIN CALCIUM 20 MG PO TABS
10.0000 mg | ORAL_TABLET | Freq: Every day | ORAL | Status: DC
  Administered 2024-09-11 – 2024-09-18 (×8): 10 mg via ORAL
  Filled 2024-09-10 (×9): qty 1

## 2024-09-10 MED ORDER — ENOXAPARIN SODIUM 40 MG/0.4ML IJ SOSY
40.0000 mg | PREFILLED_SYRINGE | INTRAMUSCULAR | Status: DC
  Administered 2024-09-11 – 2024-09-20 (×10): 40 mg via SUBCUTANEOUS
  Filled 2024-09-10 (×10): qty 0.4

## 2024-09-10 MED ORDER — ONDANSETRON 4 MG PO TBDP
4.0000 mg | ORAL_TABLET | Freq: Once | ORAL | Status: AC
  Administered 2024-09-10: 4 mg via ORAL
  Filled 2024-09-10: qty 1

## 2024-09-10 MED ORDER — VENLAFAXINE HCL 25 MG PO TABS
25.0000 mg | ORAL_TABLET | Freq: Two times a day (BID) | ORAL | Status: DC
  Administered 2024-09-11 – 2024-09-20 (×18): 25 mg via ORAL
  Filled 2024-09-10 (×20): qty 1

## 2024-09-10 MED ORDER — HYDROCODONE-ACETAMINOPHEN 5-325 MG PO TABS
1.0000 | ORAL_TABLET | ORAL | Status: DC | PRN
  Administered 2024-09-12: 2 via ORAL
  Filled 2024-09-10: qty 2

## 2024-09-10 MED ORDER — SUCRALFATE 1 G PO TABS
1.0000 g | ORAL_TABLET | Freq: Three times a day (TID) | ORAL | Status: DC
  Administered 2024-09-11 – 2024-09-20 (×32): 1 g via ORAL
  Filled 2024-09-10 (×32): qty 1

## 2024-09-10 MED ORDER — PANTOPRAZOLE SODIUM 40 MG PO TBEC
40.0000 mg | DELAYED_RELEASE_TABLET | Freq: Two times a day (BID) | ORAL | Status: DC
  Administered 2024-09-11 – 2024-09-20 (×17): 40 mg via ORAL
  Filled 2024-09-10 (×17): qty 1

## 2024-09-10 MED ORDER — MINERAL OIL RE ENEM
1.0000 | ENEMA | Freq: Once | RECTAL | Status: AC
  Administered 2024-09-10: 1 via RECTAL

## 2024-09-10 MED ORDER — ONDANSETRON HCL 4 MG PO TABS: 4.0000 mg | ORAL_TABLET | Freq: Four times a day (QID) | ORAL | Status: DC | PRN

## 2024-09-10 MED ORDER — ADULT MULTIVITAMIN W/MINERALS CH
1.0000 | ORAL_TABLET | Freq: Every day | ORAL | Status: DC
  Administered 2024-09-11 – 2024-09-18 (×8): 1 via ORAL
  Filled 2024-09-10 (×8): qty 1

## 2024-09-10 MED ORDER — LACTULOSE 10 GM/15ML PO SOLN: 30.0000 g | Freq: Once | ORAL | Status: DC

## 2024-09-10 MED ORDER — THIAMINE HCL 100 MG PO TABS
100.0000 mg | ORAL_TABLET | Freq: Every day | ORAL | Status: DC
  Administered 2024-09-11 – 2024-09-18 (×8): 100 mg via ORAL
  Filled 2024-09-10 (×18): qty 1

## 2024-09-10 NOTE — ED Notes (Signed)
 Charge RN Melody attempted ultrasound guided IV without success, IV team ordered.

## 2024-09-10 NOTE — TOC Initial Note (Addendum)
 Transition of Care (TOC) - Initial/Assessment Note    Patient Details  Name: Douglas Peterson. MRN: [id number] Date of Birth: 02/18/1951  Transition of Care [facility]) CM/SW Contact:    Karell Tukes L Taim Wurm, LCSW Phone Number: 09/10/2024, 2:24 PM  Clinical Narrative:                  CSW notified that ED High Utilizer has returned to the ED after being discharged home yesterday from the ED. [name] complaining of stomach pain and admits to drinking this morning.   CSW notified on-call SW with APS in [facility]. CSW advised of concerns. CSW was advised by Dorn, that he will contact his covering supervisor to advise of concern. Dorn was familiar with patient and ongoing concerns.   CSW awaiting to hear response from ACDSS.   2:40pm: Call received from [place], A[facility]. Dorn advised he will take down the APS report. He inquired if CSW would be available at [facility] at 4:30pm. CSW advised of availability.        Patient Goals and CMS Choice            Expected Discharge Plan and Services                                              Prior Living Arrangements/Services                       Activities of Daily Living      Permission Sought/Granted                  Emotional Assessment              Admission diagnosis:  abd pain Patient Active Problem List   Diagnosis Date Noted   Acute esophagitis 08/16/2024   Elevated troponin 08/15/2024   Head injury 03/25/2024   Alcohol withdrawal (HCC) 03/19/2024   Incarcerated hernia 01/27/2024   S/P laparoscopic hernia repair 01/27/2024   Intractable nausea and vomiting 10/06/2023   Protein-calorie malnutrition, severe 08/20/2023   Ground-level fall 08/16/2023   GERD without esophagitis 08/06/2023   Nausea 07/19/2023   Pressure injury of skin 07/11/2023   Alcohol abuse 07/11/2023   Abdominal pain 06/16/2023   Elevated lactic acid level 06/16/2023   Chronic diastolic CHF (congestive  heart failure) (HCC) 06/16/2023   Increased urinary frequency 06/08/2023   Diarrhea 06/08/2023   Upper respiratory infection 06/04/2023   Enteritis 06/02/2023   Polyuria 06/02/2023   Dyslipidemia 05/01/2023   Alcohol dependence (HCC) 05/01/2023   Maggot infestation feet 05/11/2022   Unable to care for self 05/11/2022   Debility 04/25/2022   Weakness 04/23/2022   Epistaxis 04/23/2022   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   Generalized weakness 03/18/2022   Nondisplaced fracture of shaft of left clavicle, initial encounter for closed fracture 03/18/2022   BPH (benign prostatic hyperplasia) 03/18/2022   Clavicle fracture 03/17/2022   Fall 03/17/2022   CAP (community acquired pneumonia) 03/13/2022   Altered mental status, unspecified 02/20/2022   Hypotension 02/19/2022   Dysphonia 08/13/2021   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   Paresthesia of right upper extremity 08/13/2021   SIRS (systemic inflammatory response syndrome) (HCC) 08/05/2020   GERD (gastroesophageal reflux disease) 06/05/2020   Alcohol use disorder, severe, dependence (HCC) 05/28/2020   Elevated LFTs 04/06/2020   Paroxysmal A-fib (HCC)  03/17/2020   Primary insomnia 02/06/2020   Left inguinal hernia 01/31/2020   Encounter for competency evaluation    Depression 01/17/2020   Pancytopenia (HCC)    Alcoholic cirrhosis of liver without ascites (HCC)    ETOH abuse    Alcohol intoxication    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) 09/27/2019   Hypokalemia 09/27/2019   Dysphagia 09/27/2019   Alcohol withdrawal delirium, acute, hyperactive (HCC) 09/21/2019   Pressure injury of ankle, stage 1 08/15/2019   Palliative care by specialist    Closed fracture of right proximal humerus 03/19/2019   Vitamin D  deficiency 01/11/2019   Osteoporosis 12/22/2018   Closed nondisplaced fracture of acromial process of right scapula with routine healing 11/15/2018   Compression fracture of L2 vertebra  with routine healing 11/15/2018   Delirium tremens (HCC) 03/08/2018   Essential hypertension 09/16/2017   Encephalopathy, 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   Steatohepatitis 12/03/2016   Abnormal liver enzymes 06/17/2016   Hereditary hemochromatosis 06/17/2016   Anxiety 07/16/2015   Hyponatremia 07/09/2015   H/O traumatic brain injury 05/22/2014   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HCC) 05/22/2014   PCP:  Pcp, No Pharmacy:   [facility], [place] - [address] [place] [address] Phone: [phone] Fax: [phone]     Social Drivers of Health (SDOH) Social History: SDOH Screenings   Food Insecurity: No Food Insecurity (09/04/2024)  Housing: Low Risk  (09/04/2024)  Transportation Needs: No Transportation Needs (09/04/2024)  Utilities: Not At Risk (09/04/2024)  Financial Resource Strain: Unknown (06/13/2019)  Physical Activity: Unknown (06/13/2019)  Social Connections: Socially Isolated (09/04/2024)  Stress: Unknown (06/13/2019)  Tobacco Use: Medium Risk (09/10/2024)   SDOH Interventions:     Readmission Risk Interventions    04/11/2024    5:11 PM 01/07/2024   11:31 AM 06/17/2023    4:23 PM  Readmission Risk Prevention Plan  Transportation Screening -- Complete Complete  HRI or Home Care Consult   Complete  Social Work Consult for Recovery Care Planning/Counseling   Complete  Palliative Care Screening   Not Applicable  Medication Review Oceanographer) -- Complete Complete  PCP or Specialist appointment within 3-5 days of discharge  Complete   HRI or Home Care Consult Patient refused Complete   SW Recovery Care/Counseling Consult Patient refused Complete   Palliative Care Screening Patient Refused Not Applicable   Skilled Nursing Facility -- Not Applicable

## 2024-09-10 NOTE — ED Notes (Signed)
 Pt cleaned of out of facility pull-up soaked w/ stool and urine. Perineum and sacrum appear intact. New brief and chux applied.
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
## 2024-09-10 NOTE — ED Provider Notes (Signed)
 [facility] Provider Note    Event Date/Time   First MD Initiated Contact with Patient 09/10/24 1522     (approximate)   History   Abdominal Pain   HPI  Douglas Peterson. is a 73 y.o. male with a history of some chronic debility and weakness in his right arm, alcohol abuse, A-fib.  He was recently discharged from the hospital to home after his prolonged stay in the emergency department.  Patient reports he went home and he has in-home care services now.  He has been doing okay but reports he has not a bowel movement for couple of days and started having pain in his left lower abdomen.  He feels like he is a bit constipated.  He did have a bowel movement just prior to coming to the ER though.  No nausea or vomiting no fevers or chills.  Still drinking alcohol a little  No fevers no shortness of breath.  Is otherwise been doing well and has in-home care now.  Feels like he had a sharp pain in the left lower quadrant seems to be getting better now  No black or bloody stool  No pain or burning with urination.     Physical Exam   Triage Vital Signs: ED Triage Vitals  Encounter Vitals Group     BP 09/10/24 1323 136/85     Girls Systolic BP Percentile --      Girls Diastolic BP Percentile --      Boys Systolic BP Percentile --      Boys Diastolic BP Percentile --      Pulse Rate 09/10/24 1323 69     Resp 09/10/24 1323 20     Temp 09/10/24 1323 (!) 97.5 F (36.4 C)     Temp Source 09/10/24 1323 Oral     SpO2 09/10/24 1323 98 %     Weight 09/10/24 1324 116 lb 13.5 oz (53 kg)     Height 09/10/24 1324 5' 4 (1.626 m)     Head Circumference --      Peak Flow --      Pain Score 09/10/24 1324 10     Pain Loc --      Pain Education --      Exclude from Growth Chart --     Most recent vital signs: Vitals:   09/10/24 2212 09/10/24 2319  BP: 124/69 128/76  Pulse: 73 79  Resp: 17 18  Temp: 98.2 F (36.8 C) 97.9 F (36.6 C)  SpO2: 92% 91%      General: Awake, no distress.  CV:  Good peripheral perfusion.  Normal tones and rate Resp:  Normal effort.  Clear bilateral Abd:  No distention.  Mild pain reproducible in the left lower quadrant no other associate abdominal pain.  No peritonitis.  Examined with nurse, cleaned patient's perineum, he had 1 loose brown stool mostly formed but some slight loose elements as well.  Is not black or bloody. Other:  Warm well-perfused.  Chronic weakness in his right upper extremity.  Fully alert oriented no distress vital signs normal   ED Results / Procedures / Treatments   Labs (all labs ordered are listed, but only abnormal results are displayed) Labs Reviewed  COMPREHENSIVE METABOLIC PANEL WITH GFR - Abnormal; Notable for the following components:      Result Value   Sodium 124 (*)    Chloride 88 (*)    CO2 21 (*)    BUN <5 (*)  Creatinine, Ser 0.42 (*)    Calcium  8.5 (*)    AST 72 (*)    ALT 55 (*)    All other components within normal limits  BASIC METABOLIC PANEL WITH GFR - Abnormal; Notable for the following components:   Sodium 130 (*)    Chloride 95 (*)    BUN 6 (*)    Creatinine, Ser <0.30 (*)    Calcium  8.6 (*)    All other components within normal limits  LIPASE, BLOOD  PROTIME-INR  CBC  URINALYSIS, ROUTINE W REFLEX MICROSCOPIC  BASIC METABOLIC PANEL WITH GFR  CBC   No neurologic cardiac or pulmonary symptoms.  RADIOLOGY  CT ABDOMEN PELVIS WO CONTRAST Result Date: 09/10/2024 EXAM: CT ABDOMEN AND PELVIS WITHOUT CONTRAST 09/10/2024 08:24:59 PM TECHNIQUE: CT of the abdomen and pelvis was performed without the administration of intravenous contrast. Multiplanar reformatted images are provided for review. Automated exposure control, iterative reconstruction, and/or weight-based adjustment of the mA/kV was utilized to reduce the radiation dose to as low as reasonably achievable. COMPARISON: CT abdomen and pelvis 08/15/2024. CLINICAL HISTORY: LLQ abdominal pain.  FINDINGS: LOWER CHEST: New ill-defined ground-glass nodular densities in the left lower lung measuring up to 9 mm. These are likely infectious/inflammatory, but indeterminate. LIVER: Diffuse fatty infiltration of the liver. GALLBLADDER AND BILE DUCTS: Gallbladder is unremarkable. No biliary ductal dilatation. SPLEEN: No acute abnormality. PANCREAS: No acute abnormality. ADRENAL GLANDS: No acute abnormality. KIDNEYS, URETERS AND BLADDER: No stones in the kidneys or ureters. No hydronephrosis. No perinephric or periureteral stranding. There is a small anterior left bladder diverticulum. The urinary bladder is otherwise unremarkable. GI AND BOWEL: Stomach demonstrates no acute abnormality. The rectum is dilated and stool filled with wall thickening and surrounding inflammation. The rectum measures 6.6 cm in diameter. Otherwise, there is average stool burden. Appendix is not visualized. There is no bowel obstruction. PERITONEUM AND RETROPERITONEUM: No ascites. No free air. VASCULATURE: Aorta is normal in caliber. There are atherosclerotic calcifications of the aorta. LYMPH NODES: No lymphadenopathy. REPRODUCTIVE ORGANS: No acute abnormality. BONES AND SOFT TISSUES: L2 vertebroplasty changes are present. Chronic compression deformities of T12, L1, and T7 appear stable from prior. There are small fat-containing bilateral inguinal hernias. No focal soft tissue abnormality. IMPRESSION: 1. Dilated, stool-filled rectum with wall thickening and surrounding inflammation, measuring 6.6 cm in diameter, compatible with proctitis/stercoral colitis; correlate clinically. 2. New left lower lobe ground-glass nodules up to 9 mm, likely infectious/inflammatory but indeterminate. As per Fleischner Society Guidelines for single ground-glass nodule 6 mm: recommend chest CT at 612 months to confirm persistence; if persistent, then every 2 years until 5 years or earlier evaluation if growth/solid component develops. 3. Hepatic steatosis.  Electronically signed by: Greig Pique MD 09/10/2024 08:31 PM EST RP Workstation: [id number]     PROCEDURES:  Critical Care performed: No  Procedures     IMPRESSION / MDM / ASSESSMENT AND PLAN / ED COURSE  I reviewed the triage vital signs and the nursing notes.                              Differential diagnosis includes but is not limited to, abdominal perforation, aortic dissection, cholecystitis, appendicitis, diverticulitis, colitis, esophagitis/gastritis, kidney stone, pyelonephritis, urinary tract infection, aortic aneurysm. All are considered in decision and treatment plan. Based upon the patient's presentation and risk factors, given his previous history and location of pain, such as constipation, colitis, diverticulitis, etc. strongly considered.  He is  currently afebrile.  Will check labs obtain CT abdomen pelvis to evaluate.  He has had now 1 bowel movement is fairly large brown nonbloody.  Symptoms seem to be improving, consideration that he may have had significant constipation symptoms strong potential but given his pain history location of pain etc. will proceed with imaging to assure no evidence of acute infectious causation or other concerning finding.  He does not have any associated urinary symptoms.  He is awake alert oriented.   Patient's presentation is most consistent with acute complicated illness / injury requiring diagnostic workup.      Clinical Course as of 09/11/24 0112  Sat Sep 10, 2024  2227 Consulted with and patient accepted to hospitalist by Dr. Cleatus [MQ]    Clinical Course User Index [MQ] Dicky Anes, MD    Discussed with hospitalist concerns for associated left lower quadrant pain and looks to be like he has a large stool burden in the rectal area, concerning for impaction or stercoral colitis, given the associated left lower quadrant pain, hyponatremia and the patient will warrant further treatment.  Discussed with hospitalist, patient is  difficult to access.  IV team has come and assist as well, attempt to repeat CBC being made.  At this time he does not obviously require emergent IV access and I think providing or inserting central access would be a bit of an overstep and weight of the risks and benefits of central line insertion.  Hospitalist agreeable with this plan  Does have associated hyponatremia, given his history I am quite suspicious of beer Proto mania and he does carry at least some degree of fairly chronic hyponatremia as well.  Do not believe that he has a severe acute hyponatremic picture developing and he does not appear acutely dehydrated reporting still eating and drinking, but does drink beer daily   CBC normal  FINAL CLINICAL IMPRESSION(S) / ED DIAGNOSES   Final diagnoses:  Stercoral colitis  LLQ abdominal pain  Hyponatremia  Lung nodule  Infiltrate of lower lobe of left lung present on imaging study     Rx / DC Orders   ED Discharge Orders     None        Note:  This document was prepared using Dragon voice recognition software and may include unintentional dictation errors.   Dicky Anes, MD 09/11/24 [phone]

## 2024-09-10 NOTE — ED Triage Notes (Signed)
 Pt to ED from home [facility] for abdominal pain. Pt was just discharged from boarding status from ED yesterday. Pt has very strong urine smell.   CBG 99, other VSS.  Last drink was this morning.   States is constipated. Unsure LBM.

## 2024-09-11 DIAGNOSIS — Z87891 Personal history of nicotine dependence: Secondary | ICD-10-CM | POA: Diagnosis not present

## 2024-09-11 DIAGNOSIS — K5289 Other specified noninfective gastroenteritis and colitis: Secondary | ICD-10-CM | POA: Diagnosis present

## 2024-09-11 DIAGNOSIS — I5032 Chronic diastolic (congestive) heart failure: Secondary | ICD-10-CM | POA: Diagnosis present

## 2024-09-11 DIAGNOSIS — R400 Somnolence: Secondary | ICD-10-CM | POA: Diagnosis not present

## 2024-09-11 DIAGNOSIS — R1312 Dysphagia, oropharyngeal phase: Secondary | ICD-10-CM | POA: Diagnosis present

## 2024-09-11 DIAGNOSIS — R296 Repeated falls: Secondary | ICD-10-CM | POA: Diagnosis present

## 2024-09-11 DIAGNOSIS — I110 Hypertensive heart disease with heart failure: Secondary | ICD-10-CM | POA: Diagnosis present

## 2024-09-11 DIAGNOSIS — Z609 Problem related to social environment, unspecified: Secondary | ICD-10-CM | POA: Diagnosis present

## 2024-09-11 DIAGNOSIS — Z66 Do not resuscitate: Secondary | ICD-10-CM | POA: Diagnosis present

## 2024-09-11 DIAGNOSIS — R1032 Left lower quadrant pain: Secondary | ICD-10-CM | POA: Diagnosis present

## 2024-09-11 DIAGNOSIS — K2100 Gastro-esophageal reflux disease with esophagitis, without bleeding: Secondary | ICD-10-CM | POA: Diagnosis present

## 2024-09-11 DIAGNOSIS — E871 Hypo-osmolality and hyponatremia: Secondary | ICD-10-CM | POA: Diagnosis present

## 2024-09-11 DIAGNOSIS — Z8249 Family history of ischemic heart disease and other diseases of the circulatory system: Secondary | ICD-10-CM | POA: Diagnosis not present

## 2024-09-11 DIAGNOSIS — K633 Ulcer of intestine: Secondary | ICD-10-CM | POA: Diagnosis present

## 2024-09-11 DIAGNOSIS — F1020 Alcohol dependence, uncomplicated: Secondary | ICD-10-CM | POA: Diagnosis present

## 2024-09-11 DIAGNOSIS — Z9049 Acquired absence of other specified parts of digestive tract: Secondary | ICD-10-CM | POA: Diagnosis not present

## 2024-09-11 DIAGNOSIS — Z87820 Personal history of traumatic brain injury: Secondary | ICD-10-CM | POA: Diagnosis not present

## 2024-09-11 DIAGNOSIS — T17298A Other foreign object in pharynx causing other injury, initial encounter: Secondary | ICD-10-CM | POA: Diagnosis not present

## 2024-09-11 DIAGNOSIS — N400 Enlarged prostate without lower urinary tract symptoms: Secondary | ICD-10-CM | POA: Diagnosis present

## 2024-09-11 DIAGNOSIS — K5641 Fecal impaction: Secondary | ICD-10-CM

## 2024-09-11 DIAGNOSIS — I480 Paroxysmal atrial fibrillation: Secondary | ICD-10-CM | POA: Diagnosis present

## 2024-09-11 DIAGNOSIS — W448XXA Other foreign body entering into or through a natural orifice, initial encounter: Secondary | ICD-10-CM | POA: Diagnosis not present

## 2024-09-11 DIAGNOSIS — K909 Intestinal malabsorption, unspecified: Secondary | ICD-10-CM | POA: Diagnosis present

## 2024-09-11 DIAGNOSIS — K7030 Alcoholic cirrhosis of liver without ascites: Secondary | ICD-10-CM | POA: Diagnosis present

## 2024-09-11 DIAGNOSIS — Z7901 Long term (current) use of anticoagulants: Secondary | ICD-10-CM | POA: Diagnosis not present

## 2024-09-11 DIAGNOSIS — K7581 Nonalcoholic steatohepatitis (NASH): Secondary | ICD-10-CM | POA: Diagnosis present

## 2024-09-11 LAB — URINALYSIS, ROUTINE W REFLEX MICROSCOPIC
Bilirubin Urine: NEGATIVE
Glucose, UA: NEGATIVE mg/dL
Hgb urine dipstick: NEGATIVE
Ketones, ur: NEGATIVE mg/dL
Leukocytes,Ua: NEGATIVE
Nitrite: NEGATIVE
Protein, ur: NEGATIVE mg/dL
Specific Gravity, Urine: 1.002 — ABNORMAL LOW (ref 1.005–1.030)
pH: 6 (ref 5.0–8.0)

## 2024-09-11 LAB — CBC
HCT: 38.1 % — ABNORMAL LOW (ref 39.0–52.0)
HCT: 39.9 % (ref 39.0–52.0)
Hemoglobin: 13.4 g/dL (ref 13.0–17.0)
Hemoglobin: 13.8 g/dL (ref 13.0–17.0)
MCH: 31.7 pg (ref 26.0–34.0)
MCH: 32.5 pg (ref 26.0–34.0)
MCHC: 34.6 g/dL (ref 30.0–36.0)
MCHC: 35.2 g/dL (ref 30.0–36.0)
MCV: 91.7 fL (ref 80.0–100.0)
MCV: 92.5 fL (ref 80.0–100.0)
Platelets: 166 K/uL (ref 150–400)
Platelets: 168 K/uL (ref 150–400)
RBC: 4.12 MIL/uL — ABNORMAL LOW (ref 4.22–5.81)
RBC: 4.35 MIL/uL (ref 4.22–5.81)
RDW: 12.6 % (ref 11.5–15.5)
RDW: 12.9 % (ref 11.5–15.5)
WBC: 8 K/uL (ref 4.0–10.5)
WBC: 9.7 K/uL (ref 4.0–10.5)
nRBC: 0 % (ref 0.0–0.2)
nRBC: 0 % (ref 0.0–0.2)

## 2024-09-11 LAB — BASIC METABOLIC PANEL WITH GFR
Anion gap: 11 (ref 5–15)
Anion gap: 13 (ref 5–15)
BUN: 10 mg/dL (ref 8–23)
BUN: 6 mg/dL — ABNORMAL LOW (ref 8–23)
CO2: 22 mmol/L (ref 22–32)
CO2: 27 mmol/L (ref 22–32)
Calcium: 8.6 mg/dL — ABNORMAL LOW (ref 8.9–10.3)
Calcium: 8.7 mg/dL — ABNORMAL LOW (ref 8.9–10.3)
Chloride: 95 mmol/L — ABNORMAL LOW (ref 98–111)
Chloride: 98 mmol/L (ref 98–111)
Creatinine, Ser: 0.54 mg/dL — ABNORMAL LOW (ref 0.61–1.24)
Creatinine, Ser: <0.3 mg/dL — ABNORMAL LOW (ref 0.61–1.24)
GFR, Estimated: >60 mL/min (ref >60)
Glucose, Bld: 90 mg/dL (ref 70–99)
Glucose, Bld: 91 mg/dL (ref 70–99)
Potassium: 3.8 mmol/L (ref 3.5–5.1)
Potassium: 3.8 mmol/L (ref 3.5–5.1)
Sodium: 130 mmol/L — ABNORMAL LOW (ref 135–145)
Sodium: 136 mmol/L (ref 135–145)

## 2024-09-11 MED ORDER — SODIUM CHLORIDE 1 G PO TABS
1.0000 g | ORAL_TABLET | Freq: Three times a day (TID) | ORAL | Status: AC
  Administered 2024-09-11 – 2024-09-13 (×6): 1 g via ORAL
  Filled 2024-09-11 (×6): qty 1

## 2024-09-11 MED ORDER — DOCUSATE SODIUM 100 MG PO CAPS
100.0000 mg | ORAL_CAPSULE | Freq: Two times a day (BID) | ORAL | Status: DC
  Administered 2024-09-11 – 2024-09-20 (×14): 100 mg via ORAL
  Filled 2024-09-11 (×15): qty 1

## 2024-09-11 MED ORDER — POLYETHYLENE GLYCOL 3350 17 GM/SCOOP PO POWD
238.0000 g | Freq: Once | ORAL | Status: AC
  Administered 2024-09-11: 238 g via ORAL
  Filled 2024-09-11: qty 238

## 2024-09-11 MED ORDER — PIPERACILLIN-TAZOBACTAM 3.375 G IVPB
3.3750 g | Freq: Three times a day (TID) | INTRAVENOUS | Status: DC
  Administered 2024-09-11 – 2024-09-13 (×6): 3.375 g via INTRAVENOUS
  Filled 2024-09-11 (×7): qty 50

## 2024-09-11 MED ORDER — FLEET ENEMA RE ENEM: 1.0000 | ENEMA | Freq: Once | RECTAL | Status: DC

## 2024-09-11 MED ORDER — FLEET ENEMA RE ENEM
1.0000 | ENEMA | Freq: Every day | RECTAL | Status: AC
  Administered 2024-09-11 – 2024-09-13 (×2): 1 via RECTAL

## 2024-09-11 MED ORDER — CHLORDIAZEPOXIDE HCL 25 MG PO CAPS: 25.0000 mg | ORAL_CAPSULE | Freq: Three times a day (TID) | ORAL | Status: DC | PRN

## 2024-09-11 MED ORDER — SODIUM CHLORIDE 0.9% FLUSH: 10.0000 mL | INTRAVENOUS | Status: DC | PRN

## 2024-09-11 MED ORDER — POLYETHYLENE GLYCOL 3350 17 G PO PACK: 17.0000 g | PACK | Freq: Two times a day (BID) | ORAL | Status: DC

## 2024-09-11 MED ORDER — LACTULOSE 10 GM/15ML PO SOLN
20.0000 g | Freq: Three times a day (TID) | ORAL | Status: DC
  Administered 2024-09-11 – 2024-09-20 (×22): 20 g via ORAL
  Filled 2024-09-11 (×24): qty 30

## 2024-09-11 MED ORDER — SODIUM CHLORIDE 0.9% FLUSH
10.0000 mL | Freq: Two times a day (BID) | INTRAVENOUS | Status: DC
  Administered 2024-09-11 – 2024-09-20 (×15): 10 mL

## 2024-09-11 MED ORDER — SODIUM CHLORIDE 1 G PO TABS
1.0000 g | ORAL_TABLET | Freq: Three times a day (TID) | ORAL | Status: DC
  Administered 2024-09-11 (×2): 1 g via ORAL
  Filled 2024-09-11 (×2): qty 1
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
## 2024-09-11 NOTE — Assessment & Plan Note (Signed)
 Continue atenolol  Not on anticoagulation, likely related to history of falls with injury related to ongoing alcohol use

## 2024-09-11 NOTE — Plan of Care (Signed)
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
## 2024-09-11 NOTE — Assessment & Plan Note (Signed)
 EGD 10/14 which showing grade 3 esophagitis Protonix 

## 2024-09-11 NOTE — Hospital Course (Signed)
 Douglas Peterson

## 2024-09-11 NOTE — Assessment & Plan Note (Signed)
 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

## 2024-09-11 NOTE — Progress Notes (Signed)
 Pharmacy Antibiotic Note  Douglas Peterson. is a 73 y.o. male admitted on 09/10/2024 with intra-abdominal.  Pharmacy has been consulted for Zosyn  dosing.  Plan: Zosyn  3.375g IV q8h (4 hour infusion).  Height: 5' 4 (162.6 cm) Weight: 54.5 kg (120 lb 2.4 oz) IBW/kg (Calculated) : 59.2  Temp (24hrs), Avg:97.8 F (36.6 C), Min:97.5 F (36.4 C), Max:98.2 F (36.8 C)  Recent Labs  Lab 09/04/24 1110 09/05/24 0422 09/07/24 1450 09/10/24 1521 09/10/24 2343  WBC 8.3 10.5 7.3  --  9.7  CREATININE 0.37* 0.55* 0.37* 0.42* <0.30*    CrCl cannot be calculated (This lab value cannot be used to calculate CrCl because it is not a number: <0.30).    Allergies  Allergen Reactions   Hydrochlorothiazide Other (See Comments)    Hyponatremia   Other     strawberries    Antimicrobials this admission:   >>    >>   Dose adjustments this admission:   Microbiology results:  BCx:   UCx:    Sputum:    MRSA PCR:   Thank you for allowing pharmacy to be a part of this patient's care.  Addisen Chappelle D 09/11/2024 12:43 AM

## 2024-09-11 NOTE — Assessment & Plan Note (Signed)
 Mild transaminitis slightly up from baseline AST/ALT 72/55 slightly up from 65/52 on 11/2
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
## 2024-09-11 NOTE — Progress Notes (Signed)

## 2024-09-11 NOTE — Assessment & Plan Note (Addendum)
 CIWA withdrawal protocol Thiamine  folate and multivitamin
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
## 2024-09-11 NOTE — Assessment & Plan Note (Addendum)
 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

## 2024-09-12 ENCOUNTER — Inpatient Hospital Stay

## 2024-09-12 DIAGNOSIS — K5289 Other specified noninfective gastroenteritis and colitis: Secondary | ICD-10-CM | POA: Diagnosis not present

## 2024-09-12 LAB — CBC
HCT: 37.8 % — ABNORMAL LOW (ref 39.0–52.0)
Hemoglobin: 12.9 g/dL — ABNORMAL LOW (ref 13.0–17.0)
MCH: 32.7 pg (ref 26.0–34.0)
MCHC: 34.1 g/dL (ref 30.0–36.0)
MCV: 95.9 fL (ref 80.0–100.0)
Platelets: 178 K/uL (ref 150–400)
RBC: 3.94 MIL/uL — ABNORMAL LOW (ref 4.22–5.81)
RDW: 13.4 % (ref 11.5–15.5)
WBC: 5.7 K/uL (ref 4.0–10.5)
nRBC: 0 % (ref 0.0–0.2)

## 2024-09-12 LAB — COMPREHENSIVE METABOLIC PANEL WITH GFR
ALT: 57 U/L — ABNORMAL HIGH (ref 0–44)
AST: 61 U/L — ABNORMAL HIGH (ref 15–41)
Albumin: 3.5 g/dL (ref 3.5–5.0)
Alkaline Phosphatase: 72 U/L (ref 38–126)
Anion gap: 8 (ref 5–15)
BUN: 12 mg/dL (ref 8–23)
CO2: 27 mmol/L (ref 22–32)
Calcium: 8.7 mg/dL — ABNORMAL LOW (ref 8.9–10.3)
Chloride: 105 mmol/L (ref 98–111)
Creatinine, Ser: 0.57 mg/dL — ABNORMAL LOW (ref 0.61–1.24)
GFR, Estimated: >60 mL/min (ref >60)
Glucose, Bld: 115 mg/dL — ABNORMAL HIGH (ref 70–99)
Potassium: 3.8 mmol/L (ref 3.5–5.1)
Sodium: 140 mmol/L (ref 135–145)
Total Bilirubin: 0.7 mg/dL (ref 0.0–1.2)
Total Protein: 6.9 g/dL (ref 6.5–8.1)

## 2024-09-12 MED ORDER — CHLORDIAZEPOXIDE HCL 25 MG PO CAPS
25.0000 mg | ORAL_CAPSULE | Freq: Three times a day (TID) | ORAL | Status: DC
  Administered 2024-09-12 – 2024-09-18 (×18): 25 mg via ORAL
  Filled 2024-09-12 (×18): qty 1

## 2024-09-12 NOTE — Progress Notes (Signed)
Per Dr Mal Misty, dc tele monitoring

## 2024-09-12 NOTE — Plan of Care (Signed)
 Patient noted to have multiple BM's on current shift.  Has required prn pain medication for reported low abdominal discomfort.  Remains free from any noted signs of acute  GI distress.  Tolerated all PO medications/nutrition.  Abdomin remains free from any noted signs of abnormalities.  LUE  Midline IV device remains intact.  Patient to continue to be monitored by hospital staff.
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
## 2024-09-12 NOTE — Plan of Care (Signed)

## 2024-09-13 DIAGNOSIS — K5289 Other specified noninfective gastroenteritis and colitis: Secondary | ICD-10-CM | POA: Diagnosis not present

## 2024-09-13 MED ORDER — AMOXICILLIN-POT CLAVULANATE 875-125 MG PO TABS
1.0000 | ORAL_TABLET | Freq: Two times a day (BID) | ORAL | Status: AC
  Administered 2024-09-13 – 2024-09-15 (×6): 1 via ORAL
  Filled 2024-09-13 (×7): qty 1

## 2024-09-13 MED ORDER — TAMSULOSIN HCL 0.4 MG PO CAPS
0.4000 mg | ORAL_CAPSULE | Freq: Every day | ORAL | Status: DC
  Administered 2024-09-13 – 2024-09-20 (×7): 0.4 mg via ORAL
  Filled 2024-09-13 (×7): qty 1
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
## 2024-09-13 NOTE — Plan of Care (Signed)

## 2024-09-13 NOTE — Progress Notes (Signed)
 Rogelia Copping, MD [facility]   [address] [place], [place] [address] Phone: [phone] Fax : [phone]   Subjective: This patient is here with a long history of constipation and was seen for abdominal pain.  The patient reports that he has had less pain now that he is moving his bowels.  He had not had a bowel movement for 10 days prior to coming to the hospital.  The CT demonstrated a rectal dilatation with a stercoral ulcer/colitis.  Patient has been recommended in the past to undergo a colonoscopy.  He has been having some results from his laxatives while in the hospital.   Objective: Vital signs in last 24 hours: Vitals:   09/12/24 1618 09/12/24 2011 09/13/24 0412 09/13/24 0738  BP: 130/74 136/83 (!) 140/70 137/73  Pulse: 69 71 65 62  Resp: 18 18 18 14   Temp: 97.7 F (36.5 C) 97.7 F (36.5 C) 97.6 F (36.4 C) 97.6 F (36.4 C)  TempSrc: Oral Oral    SpO2: 97% 95% 95% 95%  Weight:      Height:       Weight change:   Intake/Output Summary (Last 24 hours) at 09/13/2024 1255 Last data filed at 09/13/2024 1002 Gross per 24 hour  Intake 120 ml  Output 200 ml  Net -80 ml     Exam: Heart:: Regular rate and rhythm or without murmur or extra heart sounds Lungs: normal and clear to auscultation and percussion Abdomen: soft, nontender, normal bowel sounds   Lab Results: @LABTEST2 @ Micro Results: No results found for this or any previous visit (from the past 240 hours). Studies/Results: DG ABD ACUTE 2+V W 1V CHEST Result Date: 09/12/2024 CLINICAL DATA:  Follow-up fecal impaction. EXAM: DG ABDOMEN ACUTE WITH 1 VIEW CHEST COMPARISON:  Chest dated 04/08/2024 and abdomen and pelvis CT dated 09/10/2024. FINDINGS: Poor inspiration. Normal-sized heart. Stable mild elevation of the right hemidiaphragm. Clear lungs with normal vascularity. Stable old, healed bilateral rib fractures and diffuse osteopenia. Normal bowel-gas pattern with no stool visible in the colon or  rectum. Stable L2 kyphoplasty material. Right lower quadrant sutures. Thoracolumbar spine degenerative changes. IMPRESSION: No acute abnormality.  Resolved fecal impaction. Electronically Signed   By: Elspeth Bathe M.D.   On: 09/12/2024 12:02   Medications: I have reviewed the patient's current medications. Scheduled Meds:  amoxicillin -clavulanate  1 tablet Oral Q12H   atenolol   25 mg Oral Daily   atorvastatin   10 mg Oral Daily   chlordiazePOXIDE   25 mg Oral TID   docusate sodium   100 mg Oral BID   enoxaparin  (LOVENOX ) injection  40 mg Subcutaneous Q24H   lactulose   20 g Oral TID   multivitamin with minerals  1 tablet Oral Daily   pantoprazole   40 mg Oral BID AC   sodium chloride  flush  10-40 mL Intracatheter Q12H   sodium phosphate   1 enema Rectal Daily   sucralfate   1 g Oral TID WC & HS   tamsulosin   0.4 mg Oral Daily   thiamine   100 mg Oral Daily   venlafaxine   25 mg Oral BID WC   Continuous Infusions: PRN Meds:.acetaminophen  **OR** acetaminophen , HYDROcodone -acetaminophen , morphine  injection, ondansetron  **OR** ondansetron  (ZOFRAN ) IV, sodium chloride  flush   Assessment: Principal Problem:   Stercoral colitis Active Problems:   Hyponatremia   Esophagitis   Paroxysmal A-fib (HCC)   Alcohol use disorder, severe, dependence (HCC)   Hepatic steatosis    Plan: This patient has had less abdominal pain and continues  to improve now that he is moving his bowels.  The patient has been recommended to have a colonoscopy in the future.  There is no need to do it while the patient is in the hospital right now.  The patient has been explained the plan and I have conveyed this to the hospitalist.  I will sign off.  Please call if any further GI concerns or questions.  We would like to thank you for the opportunity to participate in the care of Douglas Dieujuste Jr..    LOS: 2 days   Rogelia Copping, MD.FACG 09/13/2024, 12:55 PM Pager [phone] 7am-5pm  Check AMION for 5pm -7am coverage  and on weekends
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
## 2024-09-14 DIAGNOSIS — K5289 Other specified noninfective gastroenteritis and colitis: Secondary | ICD-10-CM | POA: Diagnosis not present

## 2024-09-14 NOTE — Care Management Important Message (Signed)
 Important Message  Patient Details  Name: Douglas Peterson. MRN: [id number] Date of Birth: 1951/02/17   Important Message Given:  Yes - Medicare IM     Sirena Riddle W, CMA 09/14/2024, 12:35 PM
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
## 2024-09-14 NOTE — TOC Progression Note (Signed)
 Transition of Care (TOC) - Progression Note    Patient Details  Name: Douglas Peterson. MRN: [id number] Date of Birth: Sep 22, 1951  Transition of Care [facility]) CM/SW Contact  Marinda Cooks, RN Phone Number: 09/14/2024, 10:08 AM  Clinical Narrative:    This CM alerted by [facility] Admission liaison Tammy pt's pending bed offer hs been declined due to prior financial obligations. This CM informed pt used 23 rehab days at [facility] recently. This CM sent additional bed referrals out in hope to secure a bed offer to provide pt for choice . TOC will cont to follow dc planning / care coordination and update as applicable.     Expected Discharge Plan: Skilled Nursing Facility Barriers to Discharge: Continued Medical Work up               Expected Discharge Plan and Services In-house Referral: Clinical Social Work                                             Social Drivers of Health (SDOH) Interventions SDOH Screenings   Food Insecurity: No Food Insecurity (09/11/2024)  Housing: Low Risk  (09/11/2024)  Transportation Needs: No Transportation Needs (09/11/2024)  Utilities: Not At Risk (09/11/2024)  Financial Resource Strain: Unknown (06/13/2019)  Physical Activity: Unknown (06/13/2019)  Social Connections: Unknown (09/11/2024)  Recent Concern: Social Connections - Socially Isolated (09/04/2024)  Stress: Unknown (06/13/2019)  Tobacco Use: Medium Risk (09/10/2024)    Readmission Risk Interventions    04/11/2024    5:11 PM 01/07/2024   11:31 AM 06/17/2023    4:23 PM  Readmission Risk Prevention Plan  Transportation Screening -- Complete Complete  HRI or Home Care Consult   Complete  Social Work Consult for Recovery Care Planning/Counseling   Complete  Palliative Care Screening   Not Applicable  Medication Review Oceanographer) -- Complete Complete  PCP or Specialist appointment within 3-5 days of discharge  Complete   HRI or Home Care Consult Patient refused Complete   SW Recovery  Care/Counseling Consult Patient refused Complete   Palliative Care Screening Patient Refused Not Applicable   Skilled Nursing Facility -- Not Applicable

## 2024-09-14 NOTE — Plan of Care (Signed)
  Problem: Education: Goal: Knowledge of General Education information will improve Description: Including pain rating scale, medication(s)/side effects and non-pharmacologic comfort measures Outcome: Progressing   Problem: Health Behavior/Discharge Planning: Goal: Ability to manage health-related needs will improve Outcome: Progressing   Problem: Clinical Measurements: Goal: Ability to maintain clinical measurements within normal limits will improve Outcome: Progressing Goal: Will remain free from infection Outcome: Progressing Goal: Diagnostic test results will improve Outcome: Progressing Goal: Respiratory complications will improve Outcome: Progressing Goal: Cardiovascular complication will be avoided Outcome: Progressing   Problem: Activity: Goal: Risk for activity intolerance will decrease Outcome: Progressing   Problem: Nutrition: Goal: Adequate nutrition will be maintained Outcome: Progressing   Problem: Pain Managment: Goal: General experience of comfort will improve and/or be controlled Outcome: Progressing   Problem: Skin Integrity: Goal: Risk for impaired skin integrity will decrease Outcome: Progressing

## 2024-09-14 NOTE — TOC Progression Note (Signed)
 Transition of Care (TOC) - Progression Note    Patient Details  Name: Douglas Peterson. MRN: [id number] Date of Birth: [birth year]  [facility]) CM/SW Contact  Marinda Cooks, RN Phone Number: 09/14/2024, 9:29 AM  Clinical Narrative:     This CM retrieved pt's PASSAR in [place] and completed FL2 , alerted to cosign . This CM spoke with liaison [facility] at [facility] pt's listed 1st choice for SNF/Rehab to confirm if bed offer was being extended and was updated referral is still pending and being reviewed / TOC will cont to follow dc planning / care coordination and update as applicable.   Expected Discharge Plan: Skilled Nursing Facility Barriers to Discharge: Continued Medical Work up               Expected Discharge Plan and Services In-house Referral: Clinical Social Work                                             Social Drivers of Health (SDOH) Interventions SDOH Screenings   Food Insecurity: No Food Insecurity (09/11/2024)  Housing: Low Risk  (09/11/2024)  Transportation Needs: No Transportation Needs (09/11/2024)  Utilities: Not At Risk (09/11/2024)  Financial Resource Strain: Unknown (06/13/2019)  Physical Activity: Unknown (06/13/2019)  Social Connections: Unknown (09/11/2024)  Recent Concern: Social Connections - Socially Isolated (09/04/2024)  Stress: Unknown (06/13/2019)  Tobacco Use: Medium Risk (09/10/2024)    Readmission Risk Interventions    04/11/2024    5:11 PM 01/07/2024   11:31 AM 06/17/2023    4:23 PM  Readmission Risk Prevention Plan  Transportation Screening -- Complete Complete  HRI or Home Care Consult   Complete  Social Work Consult for Recovery Care Planning/Counseling   Complete  Palliative Care Screening   Not Applicable  Medication Review Oceanographer) -- Complete Complete  PCP or Specialist appointment within 3-5 days of discharge  Complete   HRI or Home Care Consult Patient refused Complete   SW Recovery Care/Counseling  Consult Patient refused Complete   Palliative Care Screening Patient Refused Not Applicable   Skilled Nursing Facility -- Not Applicable

## 2024-09-14 NOTE — NC FL2 (Signed)
 [place]  MEDICAID FL2 LEVEL OF CARE FORM     IDENTIFICATION  Patient Name: Douglas Peterson. Birthdate: 06-13-1951 Sex: male Admission Date (Current Location): 09/10/2024  [place] and [place] Number:  Chiropodist and Address:  [facility], [address], [place], [place] [address]      Provider Number: [id number]  Attending Physician Name and Address:  Leesa Kast, DO  Relative Name and Phone Number:  Sari Coupe (Daughter) [phone]    Current Level of Care: Hospital Recommended Level of Care: Skilled Nursing Facility Prior Approval Number:    Date Approved/Denied:   PASRR Number: [id number]  Discharge Plan: SNF    Current Diagnoses: Patient Active Problem List   Diagnosis Date Noted   Stercoral colitis 09/11/2024   Acute esophagitis 08/16/2024   Elevated troponin 08/15/2024   Head injury 03/25/2024   Alcohol withdrawal (HCC) 03/19/2024   Incarcerated hernia 01/27/2024   S/P laparoscopic hernia repair 01/27/2024   Intractable nausea and vomiting 10/06/2023   Protein-calorie malnutrition, severe 08/20/2023   Ground-level fall 08/16/2023   GERD without esophagitis 08/06/2023   Nausea 07/19/2023   Pressure injury of skin 07/11/2023   Alcohol abuse 07/11/2023   Abdominal pain 06/16/2023   Elevated lactic acid level 06/16/2023   Chronic diastolic CHF (congestive heart failure) (HCC) 06/16/2023   Increased urinary frequency 06/08/2023   Diarrhea 06/08/2023   Upper respiratory infection 06/04/2023   Enteritis 06/02/2023   Polyuria 06/02/2023   Dyslipidemia 05/01/2023   Alcohol dependence (HCC) 05/01/2023   Maggot infestation feet 05/11/2022   Unable to care for self 05/11/2022   Debility 04/25/2022   Weakness 04/23/2022   Epistaxis 04/23/2022   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   Generalized weakness 03/18/2022   Nondisplaced fracture of shaft of left clavicle, initial encounter for closed fracture  03/18/2022   BPH (benign prostatic hyperplasia) 03/18/2022   Clavicle fracture 03/17/2022   Fall 03/17/2022   CAP (community acquired pneumonia) 03/13/2022   Altered mental status, unspecified 02/20/2022   Hypotension 02/19/2022   Dysphonia 08/13/2021   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   Paresthesia of right upper extremity 08/13/2021   SIRS (systemic inflammatory response syndrome) (HCC) 08/05/2020   GERD (gastroesophageal reflux disease) 06/05/2020   Alcohol use disorder, severe, dependence (HCC) 05/28/2020   Elevated LFTs 04/06/2020   Paroxysmal A-fib (HCC) 03/17/2020   Primary insomnia 02/06/2020   Left inguinal hernia 01/31/2020   Encounter for competency evaluation    Depression 01/17/2020   Esophagitis    Pancytopenia (HCC)    Alcoholic cirrhosis of liver without ascites (HCC)    ETOH abuse    Alcohol intoxication    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) 09/27/2019   Hypokalemia 09/27/2019   Dysphagia 09/27/2019   Alcohol withdrawal delirium, acute, hyperactive (HCC) 09/21/2019   Pressure injury of ankle, stage 1 08/15/2019   Palliative care by specialist    Closed fracture of right proximal humerus 03/19/2019   Vitamin D  deficiency 01/11/2019   Osteoporosis 12/22/2018   Closed nondisplaced fracture of acromial process of right scapula with routine healing 11/15/2018   Compression fracture of L2 vertebra with routine healing 11/15/2018   Delirium tremens (HCC) 03/08/2018   Essential hypertension 09/16/2017   Encephalopathy, 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   Hepatic steatosis 12/03/2016   Abnormal liver enzymes 06/17/2016   Hereditary hemochromatosis 06/17/2016   Anxiety 07/16/2015   Hyponatremia 07/09/2015   H/O traumatic brain injury 05/22/2014   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HCC)  05/22/2014    Orientation RESPIRATION BLADDER Height & Weight     Self, Time, Situation, Place  Normal Incontinent Weight: 54.5 kg Height:  5' 4 (162.6  cm)  BEHAVIORAL SYMPTOMS/MOOD NEUROLOGICAL BOWEL NUTRITION STATUS     (PMH [place] abuse) Incontinent Diet (Diet Heart with thin liquids)  AMBULATORY STATUS COMMUNICATION OF NEEDS Skin   Limited Assist Verbally Normal                       Personal Care Assistance Level of Assistance  Bathing, Feeding, Dressing Bathing Assistance: Limited assistance Feeding assistance: Limited assistance Dressing Assistance: Limited assistance     Functional Limitations Info             SPECIAL CARE FACTORS FREQUENCY                       Contractures Contractures Info: Not present    Additional Factors Info  Code Status Code Status Info: Full Allergies Info: Hydrochlorothiazide           Current Medications (09/14/2024):  This is the current hospital active medication list Current Facility-Administered Medications  Medication Dose Route Frequency Provider Last Rate Last Admin   acetaminophen  (TYLENOL ) tablet 650 mg  650 mg Oral Q6H PRN Duncan, Hazel V, MD       Or   acetaminophen  (TYLENOL ) suppository 650 mg  650 mg Rectal Q6H PRN Duncan, Hazel V, MD       amoxicillin -clavulanate (AUGMENTIN ) 875-125 MG per tablet 1 tablet  1 tablet Oral Q12H Jens Durand, MD   1 tablet at 09/14/24 0915   atenolol  (TENORMIN ) tablet 25 mg  25 mg Oral Daily Duncan, Hazel V, MD   25 mg at 09/14/24 0916   atorvastatin  (LIPITOR) tablet 10 mg  10 mg Oral Daily Duncan, Hazel V, MD   10 mg at 09/14/24 9083   chlordiazePOXIDE  (LIBRIUM ) capsule 25 mg  25 mg Oral TID Jens Durand, MD   25 mg at 09/14/24 0915   docusate sodium  (COLACE) capsule 100 mg  100 mg Oral BID Russo, Steven Michael, DO   100 mg at 09/14/24 9083   enoxaparin  (LOVENOX ) injection 40 mg  40 mg Subcutaneous Q24H Duncan, Hazel V, MD   40 mg at 09/14/24 0916   HYDROcodone -acetaminophen  (NORCO/VICODIN) 5-325 MG per tablet 1-2 tablet  1-2 tablet Oral Q4H PRN Duncan, Hazel V, MD   2 tablet at 09/12/24 0248   lactulose  (CHRONULAC ) 10  GM/15ML solution 20 g  20 g Oral TID Trudy Anthony HERO, MD   20 g at 09/14/24 0916   morphine  (PF) 2 MG/ML injection 2 mg  2 mg Intravenous Q2H PRN Duncan, Hazel V, MD       multivitamin with minerals tablet 1 tablet  1 tablet Oral Daily Cleatus Delayne GAILS, MD   1 tablet at 09/14/24 0915   ondansetron  (ZOFRAN ) tablet 4 mg  4 mg Oral Q6H PRN Duncan, Hazel V, MD       Or   ondansetron  (ZOFRAN ) injection 4 mg  4 mg Intravenous Q6H PRN Duncan, Hazel V, MD       pantoprazole  (PROTONIX ) EC tablet 40 mg  40 mg Oral BID AC Duncan, Hazel V, MD   40 mg at 09/14/24 0915   sodium chloride  flush (NS) 0.9 % injection 10-40 mL  10-40 mL Intracatheter Q12H Trudy Anthony HERO, MD   10 mL at 09/14/24 0925   sodium chloride  flush (NS) 0.9 %  injection 10-40 mL  10-40 mL Intracatheter PRN Trudy Anthony HERO, MD       sodium phosphate  (FLEET) enema 1 enema  1 enema Rectal Daily Onita Elspeth Sharper, DO   1 enema at 09/13/24 9065   sucralfate  (CARAFATE ) tablet 1 g  1 g Oral TID WC & HS Duncan, Hazel V, MD   1 g at 09/14/24 0915   tamsulosin  (FLOMAX ) capsule 0.4 mg  0.4 mg Oral Daily Jens Durand, MD   0.4 mg at 09/14/24 0915   thiamine  (VITAMIN B1) tablet 100 mg  100 mg Oral Daily Duncan, Hazel V, MD   100 mg at 09/14/24 9083   venlafaxine  (EFFEXOR ) tablet 25 mg  25 mg Oral BID WC Duncan, Hazel V, MD   25 mg at 09/14/24 9083     Discharge Medications: Please see discharge summary for a list of discharge medications.  Relevant Imaging Results:  Relevant Lab Results:   Additional Information [id number]  Marinda Cooks, RN

## 2024-09-14 NOTE — Plan of Care (Signed)
  Problem: Education: Goal: Knowledge of General Education information will improve Description: Including pain rating scale, medication(s)/side effects and non-pharmacologic comfort measures Outcome: Progressing   Problem: Health Behavior/Discharge Planning: Goal: Ability to manage health-related needs will improve Outcome: Progressing   Problem: Clinical Measurements: Goal: Will remain free from infection Outcome: Progressing Goal: Diagnostic test results will improve Outcome: Progressing   Problem: Coping: Goal: Level of anxiety will decrease Outcome: Progressing

## 2024-09-15 DIAGNOSIS — K5289 Other specified noninfective gastroenteritis and colitis: Secondary | ICD-10-CM | POA: Diagnosis not present

## 2024-09-15 MED ORDER — FLEET ENEMA RE ENEM: 1.0000 | ENEMA | Freq: Every day | RECTAL | Status: DC | PRN

## 2024-09-15 NOTE — TOC Progression Note (Addendum)
 Transition of Care (TOC) - Progression Note    Patient Details  Name: Douglas Peterson. MRN: [id number] Date of Birth: 04/24/1951  Transition of Care [facility]) CM/SW Contact  Marinda Cooks, RN Phone Number: 09/15/2024, 4:36 PM  Clinical Narrative:    This CM reviewed hub and confirmed pending bed offers listed below . This CM spoke with pt introduced role and discussed bed offers. Pt confirmed he would like to move forward with SNF/rehab Facility [facility]  this CM attempted to reach Admission Liaison and was sent to VM , detailed message left including this CM direct contact info . TOC will cont to follow dc planning /care coordination and update as applicable.    Expected Discharge Plan: Skilled Nursing Facility Barriers to Discharge: Continued Medical Work up               Expected Discharge Plan and Services In-house Referral: Clinical Social Work       Expected Discharge Date: 09/15/24                                     Social Drivers of Health (SDOH) Interventions SDOH Screenings   Food Insecurity: No Food Insecurity (09/11/2024)  Housing: Low Risk  (09/11/2024)  Transportation Needs: No Transportation Needs (09/11/2024)  Utilities: Not At Risk (09/11/2024)  Financial Resource Strain: Unknown (06/13/2019)  Physical Activity: Unknown (06/13/2019)  Social Connections: Unknown (09/11/2024)  Recent Concern: Social Connections - Socially Isolated (09/04/2024)  Stress: Unknown (06/13/2019)  Tobacco Use: Medium Risk (09/10/2024)    Readmission Risk Interventions    04/11/2024    5:11 PM 01/07/2024   11:31 AM 06/17/2023    4:23 PM  Readmission Risk Prevention Plan  Transportation Screening -- Complete Complete  HRI or Home Care Consult   Complete  Social Work Consult for Recovery Care Planning/Counseling   Complete  Palliative Care Screening   Not Applicable  Medication Review Oceanographer) -- Complete Complete  PCP or  Specialist appointment within 3-5 days of discharge  Complete   HRI or Home Care Consult Patient refused Complete   SW Recovery Care/Counseling Consult Patient refused Complete   Palliative Care Screening Patient Refused Not Applicable   Skilled Nursing Facility -- Not Applicable
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
## 2024-09-15 NOTE — Plan of Care (Signed)
  Problem: Education: Goal: Knowledge of General Education information will improve Description: Including pain rating scale, medication(s)/side effects and non-pharmacologic comfort measures Outcome: Progressing   Problem: Clinical Measurements: Goal: Will remain free from infection Outcome: Progressing   Problem: Activity: Goal: Risk for activity intolerance will decrease Outcome: Progressing   Problem: Coping: Goal: Level of anxiety will decrease Outcome: Progressing   Problem: Skin Integrity: Goal: Risk for impaired skin integrity will decrease Outcome: Progressing
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
## 2024-09-15 NOTE — TOC Progression Note (Signed)
 Transition of Care (TOC) - Progression Note    Patient Details  Name: Douglas Peterson. MRN: [id number] Date of Birth: 1951/07/23  Transition of Care [facility]) CM/SW Contact  Marinda Cooks, RN Phone Number: 09/15/2024, 8:20 AM  Clinical Narrative:    This CM reviewed HUB used for bed referrals reccommended for rehab. No bed offers extended at this time. This CM extended search and bed referrals to further radius . TOC will cont to follow dc planning / care coordination and update as applicable.    Expected Discharge Plan: Skilled Nursing Facility Barriers to Discharge: Continued Medical Work up               Expected Discharge Plan and Services In-house Referral: Clinical Social Work                                             Social Drivers of Health (SDOH) Interventions SDOH Screenings   Food Insecurity: No Food Insecurity (09/11/2024)  Housing: Low Risk  (09/11/2024)  Transportation Needs: No Transportation Needs (09/11/2024)  Utilities: Not At Risk (09/11/2024)  Financial Resource Strain: Unknown (06/13/2019)  Physical Activity: Unknown (06/13/2019)  Social Connections: Unknown (09/11/2024)  Recent Concern: Social Connections - Socially Isolated (09/04/2024)  Stress: Unknown (06/13/2019)  Tobacco Use: Medium Risk (09/10/2024)    Readmission Risk Interventions    04/11/2024    5:11 PM 01/07/2024   11:31 AM 06/17/2023    4:23 PM  Readmission Risk Prevention Plan  Transportation Screening -- Complete Complete  HRI or Home Care Consult   Complete  Social Work Consult for Recovery Care Planning/Counseling   Complete  Palliative Care Screening   Not Applicable  Medication Review Oceanographer) -- Complete Complete  PCP or Specialist appointment within 3-5 days of discharge  Complete   HRI or Home Care Consult Patient refused Complete   SW Recovery Care/Counseling Consult Patient refused Complete   Palliative Care Screening Patient Refused Not Applicable    Skilled Nursing Facility -- Not Applicable

## 2024-09-15 NOTE — Progress Notes (Signed)
 Mobility Specialist - Progress Note     09/15/24 1511  Mobility  Activity Ambulated with assistance;Stood at bedside  Level of Assistance Moderate assist, patient does 50-74%  Assistive Device BSC  Distance Ambulated (ft) 5 ft  Range of Motion/Exercises Active  Activity Response Tolerated well  Mobility Referral Yes  Mobility visit 1 Mobility  Mobility Specialist Start Time (ACUTE ONLY) 1457  Mobility Specialist Stop Time (ACUTE ONLY) 1511  Mobility Specialist Time Calculation (min) (ACUTE ONLY) 14 min   Pt resting in bed on RA upon entry. Pt STS and transfers to [facility] ModA with Cane. Pt required manual heavy lifting during ambulalion back to bed to prevent fall. Pt returned to bed and left with needs in reach. Bed alarm activated.   Guido Rumble Mobility Specialist 09/15/24, 3:17 PM
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
## 2024-09-16 DIAGNOSIS — K5289 Other specified noninfective gastroenteritis and colitis: Secondary | ICD-10-CM | POA: Diagnosis not present

## 2024-09-16 NOTE — Plan of Care (Signed)
  Problem: Clinical Measurements: Goal: Ability to maintain clinical measurements within normal limits will improve Outcome: Progressing   Problem: Pain Managment: Goal: General experience of comfort will improve and/or be controlled Outcome: Progressing   Problem: Safety: Goal: Ability to remain free from injury will improve Outcome: Progressing   Problem: Skin Integrity: Goal: Risk for impaired skin integrity will decrease Outcome: Progressing   Problem: Elimination: Goal: Will not experience complications related to bowel motility Outcome: Progressing   Problem: Coping: Goal: Level of anxiety will decrease Outcome: Progressing
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
## 2024-09-16 NOTE — TOC Progression Note (Signed)
 Transition of Care (TOC) - Progression Note    Patient Details  Name: Douglas Peterson. MRN: [id number] Date of Birth: November 01, 1951  Transition of Care [facility]) CM/SW Contact  Marinda Cooks, RN Phone Number: 09/16/2024, 10:18 AM  Clinical Narrative:    This CM spoke with admission Liaison at -[facility]  [place] at [phone] and confirmed bed offer for pt. This CM alerted CMA's to start ins auth. TOC will cont to follow dc planning / care coordination and update as applicable/.    Expected Discharge Plan: Skilled Nursing Facility Barriers to Discharge: Continued Medical Work up               Expected Discharge Plan and Services In-house Referral: Clinical Social Work       Expected Discharge Date: 09/15/24                                     Social Drivers of Health (SDOH) Interventions SDOH Screenings   Food Insecurity: No Food Insecurity (09/11/2024)  Housing: Low Risk  (09/11/2024)  Transportation Needs: No Transportation Needs (09/11/2024)  Utilities: Not At Risk (09/11/2024)  Financial Resource Strain: Unknown (06/13/2019)  Physical Activity: Unknown (06/13/2019)  Social Connections: Unknown (09/11/2024)  Recent Concern: Social Connections - Socially Isolated (09/04/2024)  Stress: Unknown (06/13/2019)  Tobacco Use: Medium Risk (09/10/2024)    Readmission Risk Interventions    04/11/2024    5:11 PM 01/07/2024   11:31 AM 06/17/2023    4:23 PM  Readmission Risk Prevention Plan  Transportation Screening -- Complete Complete  HRI or Home Care Consult   Complete  Social Work Consult for Recovery Care Planning/Counseling   Complete  Palliative Care Screening   Not Applicable  Medication Review Oceanographer) -- Complete Complete  PCP or Specialist appointment within 3-5 days of discharge  Complete   HRI or Home Care Consult Patient refused Complete   SW Recovery Care/Counseling Consult Patient refused Complete    Palliative Care Screening Patient Refused Not Applicable   Skilled Nursing Facility -- Not Applicable

## 2024-09-16 NOTE — Plan of Care (Signed)
 Patient had an overall good say. Patient continues to be super sleepy and will awake with loud speaking. Patient does have a productive cough that has been present. Cough and dep breathing reinforced often to clear throat.   Problem: Education: Goal: Knowledge of General Education information will improve Description: Including pain rating scale, medication(s)/side effects and non-pharmacologic comfort measures Outcome: Progressing   Problem: Health Behavior/Discharge Planning: Goal: Ability to manage health-related needs will improve Outcome: Progressing   Problem: Clinical Measurements: Goal: Ability to maintain clinical measurements within normal limits will improve Outcome: Progressing Goal: Will remain free from infection Outcome: Progressing Goal: Diagnostic test results will improve Outcome: Progressing Goal: Respiratory complications will improve Outcome: Progressing Goal: Cardiovascular complication will be avoided Outcome: Progressing   Problem: Activity: Goal: Risk for activity intolerance will decrease Outcome: Progressing   Problem: Nutrition: Goal: Adequate nutrition will be maintained Outcome: Progressing   Problem: Coping: Goal: Level of anxiety will decrease Outcome: Progressing   Problem: Elimination: Goal: Will not experience complications related to bowel motility Outcome: Progressing Goal: Will not experience complications related to urinary retention Outcome: Progressing   Problem: Safety: Goal: Ability to remain free from injury will improve Outcome: Progressing   Problem: Skin Integrity: Goal: Risk for impaired skin integrity will decrease Outcome: Progressing

## 2024-09-17 ENCOUNTER — Inpatient Hospital Stay

## 2024-09-17 DIAGNOSIS — K5289 Other specified noninfective gastroenteritis and colitis: Secondary | ICD-10-CM | POA: Diagnosis not present
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
## 2024-09-17 NOTE — Plan of Care (Signed)
  Problem: Clinical Measurements: Goal: Ability to maintain clinical measurements within normal limits will improve Outcome: Progressing   Problem: Elimination: Goal: Will not experience complications related to bowel motility Outcome: Progressing   Problem: Pain Managment: Goal: General experience of comfort will improve and/or be controlled Outcome: Progressing   Problem: Safety: Goal: Ability to remain free from injury will improve Outcome: Progressing

## 2024-09-17 NOTE — Plan of Care (Signed)

## 2024-09-18 DIAGNOSIS — K5289 Other specified noninfective gastroenteritis and colitis: Secondary | ICD-10-CM | POA: Diagnosis not present

## 2024-09-18 LAB — CBC
HCT: 43.2 % (ref 39.0–52.0)
Hemoglobin: 14.4 g/dL (ref 13.0–17.0)
MCH: 32.3 pg (ref 26.0–34.0)
MCHC: 33.3 g/dL (ref 30.0–36.0)
MCV: 96.9 fL (ref 80.0–100.0)
Platelets: 271 K/uL (ref 150–400)
RBC: 4.46 MIL/uL (ref 4.22–5.81)
RDW: 13.1 % (ref 11.5–15.5)
WBC: 6.4 K/uL (ref 4.0–10.5)
nRBC: 0 % (ref 0.0–0.2)

## 2024-09-18 LAB — COMPREHENSIVE METABOLIC PANEL WITH GFR
ALT: 38 U/L (ref 0–44)
AST: 28 U/L (ref 15–41)
Albumin: 4 g/dL (ref 3.5–5.0)
Alkaline Phosphatase: 75 U/L (ref 38–126)
Anion gap: 10 (ref 5–15)
BUN: 19 mg/dL (ref 8–23)
CO2: 28 mmol/L (ref 22–32)
Calcium: 9.4 mg/dL (ref 8.9–10.3)
Chloride: 105 mmol/L (ref 98–111)
Creatinine, Ser: 0.62 mg/dL (ref 0.61–1.24)
GFR, Estimated: >60 mL/min (ref >60)
Glucose, Bld: 94 mg/dL (ref 70–99)
Potassium: 4.1 mmol/L (ref 3.5–5.1)
Sodium: 144 mmol/L (ref 135–145)
Total Bilirubin: 0.4 mg/dL (ref 0.0–1.2)
Total Protein: 7 g/dL (ref 6.5–8.1)

## 2024-09-18 MED ORDER — ATENOLOL 25 MG PO TABS
12.5000 mg | ORAL_TABLET | Freq: Every day | ORAL | Status: DC
  Administered 2024-09-18: 12.5 mg via ORAL
  Filled 2024-09-18 (×3): qty 0.5

## 2024-09-18 NOTE — Plan of Care (Signed)
  Problem: Clinical Measurements: Goal: Ability to maintain clinical measurements within normal limits will improve Outcome: Progressing   Problem: Elimination: Goal: Will not experience complications related to bowel motility Outcome: Progressing   Problem: Coping: Goal: Level of anxiety will decrease Outcome: Progressing   Problem: Pain Managment: Goal: General experience of comfort will improve and/or be controlled Outcome: Progressing   Problem: Safety: Goal: Ability to remain free from injury will improve Outcome: Progressing   Problem: Skin Integrity: Goal: Risk for impaired skin integrity will decrease Outcome: Progressing

## 2024-09-18 NOTE — Progress Notes (Signed)
 SLP Cancellation Note  Patient Details Name: Douglas Peterson. MRN: [id number] DOB: [birth year]   Cancelled treatment:       Reason Eval/Treat Not Completed: Other (comment) ST services received consult. Pt is known to our services - per chart he has had 7 instrumental swallow studies with recommendations made for NPO d/t severity of oropharyngeal dysphagia and silent aspiration. At this time, it is outside of ST abilities to be impactful.   Malikah Lakey 09/18/2024, 8:47 AM
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
## 2024-09-19 DIAGNOSIS — K5289 Other specified noninfective gastroenteritis and colitis: Secondary | ICD-10-CM | POA: Diagnosis not present

## 2024-09-19 MED ORDER — LACTATED RINGERS IV SOLN: INTRAVENOUS | Status: AC

## 2024-09-19 NOTE — Plan of Care (Signed)

## 2024-09-19 NOTE — TOC Progression Note (Signed)
 Transition of Care (TOC) - Progression Note    Patient Details  Name: Douglas Peterson. MRN: [id number] Date of Birth: [birth year]  Transition of Care [facility]) CM/SW Contact  Victory Jackquline RAMAN, RN Phone Number: 09/19/2024, 7:25 PM  Clinical Narrative:   RNCM received a message via secure chat from CMA with Auth Approval: SNF Certified in Total Certification @# [id number] 09/19/24-09/26/24. RNCM called [facility] for Nursing @ [phone] @ 3:06 pm and 4:02 pm and left a message to let them know we had Auth approval and to see if they had a bed available awaiting call back. RNCM will continue to follow for discharge planning needs.      Expected Discharge Plan: Skilled Nursing Facility Barriers to Discharge: Continued Medical Work up               Expected Discharge Plan and Services In-house Referral: Clinical Social Work       Expected Discharge Date: 09/15/24                                     Social Drivers of Health (SDOH) Interventions SDOH Screenings   Food Insecurity: No Food Insecurity (09/11/2024)  Housing: Low Risk  (09/11/2024)  Transportation Needs: No Transportation Needs (09/11/2024)  Utilities: Not At Risk (09/11/2024)  Financial Resource Strain: Unknown (06/13/2019)  Physical Activity: Unknown (06/13/2019)  Social Connections: Unknown (09/11/2024)  Recent Concern: Social Connections - Socially Isolated (09/04/2024)  Stress: Unknown (06/13/2019)  Tobacco Use: Medium Risk (09/10/2024)    Readmission Risk Interventions    04/11/2024    5:11 PM 01/07/2024   11:31 AM 06/17/2023    4:23 PM  Readmission Risk Prevention Plan  Transportation Screening -- Complete Complete  HRI or Home Care Consult   Complete  Social Work Consult for Recovery Care Planning/Counseling   Complete  Palliative Care Screening   Not Applicable  Medication Review Oceanographer) -- Complete Complete  PCP or Specialist appointment within 3-5 days of discharge  Complete    HRI or Home Care Consult Patient refused Complete   SW Recovery Care/Counseling Consult Patient refused Complete   Palliative Care Screening Patient Refused Not Applicable   Skilled Nursing Facility -- Not Applicable
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
## 2024-09-20 DIAGNOSIS — K5289 Other specified noninfective gastroenteritis and colitis: Secondary | ICD-10-CM | POA: Diagnosis not present

## 2024-09-20 LAB — COMPREHENSIVE METABOLIC PANEL WITH GFR
ALT: 29 U/L (ref 0–44)
AST: 26 U/L (ref 15–41)
Albumin: 3.7 g/dL (ref 3.5–5.0)
Alkaline Phosphatase: 72 U/L (ref 38–126)
Anion gap: 10 (ref 5–15)
BUN: 18 mg/dL (ref 8–23)
CO2: 27 mmol/L (ref 22–32)
Calcium: 9 mg/dL (ref 8.9–10.3)
Chloride: 102 mmol/L (ref 98–111)
Creatinine, Ser: 0.58 mg/dL — ABNORMAL LOW (ref 0.61–1.24)
GFR, Estimated: >60 mL/min (ref >60)
Glucose, Bld: 83 mg/dL (ref 70–99)
Potassium: 3.8 mmol/L (ref 3.5–5.1)
Sodium: 139 mmol/L (ref 135–145)
Total Bilirubin: 0.5 mg/dL (ref 0.0–1.2)
Total Protein: 6.4 g/dL — ABNORMAL LOW (ref 6.5–8.1)

## 2024-09-20 LAB — CBC WITH DIFFERENTIAL/PLATELET
Abs Immature Granulocytes: 0.01 K/uL (ref 0.00–0.07)
Basophils Absolute: 0.1 K/uL (ref 0.0–0.1)
Basophils Relative: 1 %
Eosinophils Absolute: 0.1 K/uL (ref 0.0–0.5)
Eosinophils Relative: 2 %
HCT: 39.3 % (ref 39.0–52.0)
Hemoglobin: 13.1 g/dL (ref 13.0–17.0)
Immature Granulocytes: 0 %
Lymphocytes Relative: 40 %
Lymphs Abs: 2.4 K/uL (ref 0.7–4.0)
MCH: 32.1 pg (ref 26.0–34.0)
MCHC: 33.3 g/dL (ref 30.0–36.0)
MCV: 96.3 fL (ref 80.0–100.0)
Monocytes Absolute: 0.6 K/uL (ref 0.1–1.0)
Monocytes Relative: 9 %
Neutro Abs: 3 K/uL (ref 1.7–7.7)
Neutrophils Relative %: 48 %
Platelets: 232 K/uL (ref 150–400)
RBC: 4.08 MIL/uL — ABNORMAL LOW (ref 4.22–5.81)
RDW: 12.7 % (ref 11.5–15.5)
WBC: 6.1 K/uL (ref 4.0–10.5)
nRBC: 0 % (ref 0.0–0.2)

## 2024-09-20 LAB — AMMONIA: Ammonia: 24 umol/L (ref 9–35)

## 2024-09-20 LAB — MAGNESIUM: Magnesium: 2 mg/dL (ref 1.7–2.4)

## 2024-09-20 LAB — PHOSPHORUS: Phosphorus: 3.5 mg/dL (ref 2.5–4.6)

## 2024-09-20 MED ORDER — TRAZODONE HCL 50 MG PO TABS
25.0000 mg | ORAL_TABLET | Freq: Every evening | ORAL | Status: DC | PRN
  Administered 2024-09-20 (×2): 25 mg via ORAL
  Filled 2024-09-20 (×2): qty 1

## 2024-09-20 MED ORDER — LACTULOSE 10 GM/15ML PO SOLN: 20.0000 g | Freq: Three times a day (TID) | ORAL | Status: AC
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
## 2024-09-20 NOTE — TOC Progression Note (Addendum)
 Transition of Care (TOC) - Progression Note    Patient Details  Name: Douglas Peterson. MRN: [id number] Date of Birth: January 18, 1951  Transition of Care [facility]) CM/SW Contact  Victory Jackquline RAMAN, RN Phone Number: 09/20/2024, 10:48 AM  Clinical Narrative:  10:24  am: [facility] received a message via secure chat from the MD asking if this patient's Auth was approved for SNF. I informed him that we received SNF Auth Approval: Certified in Total Certification @# [id number] 09/19/24-09/26/24.   10:37 am: [facility] called [facility] for Nursing @ [phone] and spoke to [facility] and he said that the patient was assigned to Rm# [id number] and the nurse can call report to [phone] and ask for the 3rd floor and speak to [place]. MD made aware.   10:44 am: RNCM called patient's daughter Sari @ [phone] and left a message. Awaiting call back.  10:47 am: Was informed by MD that the patient is not medically stable for discharge today due to change in clinical condition. RNCM will continue to follow for discharge planning needs.  10:59 am: Florence Ade back with [facility] @ [phone] and informed him that the patient will not be discharged today because there has been a changed in patient's status and is not medically stable for discharge.     Expected Discharge Plan: Skilled Nursing Facility Barriers to Discharge: Continued Medical Work up               Expected Discharge Plan and Services In-house Referral: Clinical Social Work       Expected Discharge Date: 09/15/24                                     Social Drivers of Health (SDOH) Interventions SDOH Screenings   Food Insecurity: No Food Insecurity (09/11/2024)  Housing: Low Risk  (09/11/2024)  Transportation Needs: No Transportation Needs (09/11/2024)  Utilities: Not At Risk (09/11/2024)  Financial Resource Strain: Unknown (06/13/2019)  Physical Activity: Unknown (06/13/2019)  Social  Connections: Unknown (09/11/2024)  Recent Concern: Social Connections - Socially Isolated (09/04/2024)  Stress: Unknown (06/13/2019)  Tobacco Use: Medium Risk (09/10/2024)    Readmission Risk Interventions    04/11/2024    5:11 PM 01/07/2024   11:31 AM 06/17/2023    4:23 PM  Readmission Risk Prevention Plan  Transportation Screening -- Complete Complete  HRI or Home Care Consult   Complete  Social Work Consult for Recovery Care Planning/Counseling   Complete  Palliative Care Screening   Not Applicable  Medication Review Oceanographer) -- Complete Complete  PCP or Specialist appointment within 3-5 days of discharge  Complete   HRI or Home Care Consult Patient refused Complete   SW Recovery Care/Counseling Consult Patient refused Complete   Palliative Care Screening Patient Refused Not Applicable   Skilled Nursing Facility -- Not Applicable
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
## 2024-09-20 NOTE — Progress Notes (Signed)
 Patient able to tolerate meds crushed with applesauce, coughing noted with thin liquids

## 2024-09-20 NOTE — Progress Notes (Signed)
  Progress Note   Date: 09/20/2024  Patient Name: Douglas Peterson.        

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
## 2024-09-20 NOTE — Progress Notes (Signed)
 Report called to Secretary/administrator at [facility],

## 2024-09-20 NOTE — Plan of Care (Signed)
   Problem: Education: Goal: Knowledge of General Education information will improve Description Including pain rating scale, medication(s)/side effects and non-pharmacologic comfort measures Outcome: Progressing   Problem: Health Behavior/Discharge Planning: Goal: Ability to manage health-related needs will improve Outcome: Progressing
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
## 2024-11-11 ENCOUNTER — Other Ambulatory Visit: Payer: Self-pay

## 2024-11-11 ENCOUNTER — Emergency Department

## 2024-11-11 ENCOUNTER — Inpatient Hospital Stay
Admission: EM | Admit: 2024-11-11 | Discharge: 2024-11-15 | DRG: 640 | Disposition: A | Discharge status: Skilled Nursing Facility | Attending: Internal Medicine | Admitting: Internal Medicine

## 2024-11-11 DIAGNOSIS — K2100 Gastro-esophageal reflux disease with esophagitis, without bleeding: Secondary | ICD-10-CM | POA: Diagnosis not present

## 2024-11-11 DIAGNOSIS — Z91018 Allergy to other foods: Secondary | ICD-10-CM

## 2024-11-11 DIAGNOSIS — I5032 Chronic diastolic (congestive) heart failure: Secondary | ICD-10-CM | POA: Diagnosis not present

## 2024-11-11 DIAGNOSIS — R1313 Dysphagia, pharyngeal phase: Secondary | ICD-10-CM | POA: Diagnosis present

## 2024-11-11 DIAGNOSIS — I10 Essential (primary) hypertension: Secondary | ICD-10-CM | POA: Diagnosis present

## 2024-11-11 DIAGNOSIS — D6959 Other secondary thrombocytopenia: Secondary | ICD-10-CM | POA: Diagnosis not present

## 2024-11-11 DIAGNOSIS — R54 Age-related physical debility: Secondary | ICD-10-CM | POA: Diagnosis present

## 2024-11-11 DIAGNOSIS — Z8719 Personal history of other diseases of the digestive system: Secondary | ICD-10-CM

## 2024-11-11 DIAGNOSIS — D72829 Elevated white blood cell count, unspecified: Secondary | ICD-10-CM | POA: Diagnosis present

## 2024-11-11 DIAGNOSIS — F32A Depression, unspecified: Secondary | ICD-10-CM | POA: Diagnosis present

## 2024-11-11 DIAGNOSIS — Z87828 Personal history of other (healed) physical injury and trauma: Secondary | ICD-10-CM | POA: Diagnosis not present

## 2024-11-11 DIAGNOSIS — F419 Anxiety disorder, unspecified: Secondary | ICD-10-CM | POA: Diagnosis present

## 2024-11-11 DIAGNOSIS — E876 Hypokalemia: Secondary | ICD-10-CM | POA: Diagnosis present

## 2024-11-11 DIAGNOSIS — I480 Paroxysmal atrial fibrillation: Secondary | ICD-10-CM | POA: Diagnosis not present

## 2024-11-11 DIAGNOSIS — I110 Hypertensive heart disease with heart failure: Secondary | ICD-10-CM | POA: Diagnosis present

## 2024-11-11 DIAGNOSIS — K7030 Alcoholic cirrhosis of liver without ascites: Secondary | ICD-10-CM | POA: Diagnosis not present

## 2024-11-11 DIAGNOSIS — E43 Unspecified severe protein-calorie malnutrition: Secondary | ICD-10-CM | POA: Diagnosis present

## 2024-11-11 DIAGNOSIS — Z79899 Other long term (current) drug therapy: Secondary | ICD-10-CM

## 2024-11-11 DIAGNOSIS — T510X1A Toxic effect of ethanol, accidental (unintentional), initial encounter: Secondary | ICD-10-CM | POA: Diagnosis present

## 2024-11-11 DIAGNOSIS — S069XAA Unspecified intracranial injury with loss of consciousness status unknown, initial encounter: Secondary | ICD-10-CM

## 2024-11-11 DIAGNOSIS — Z888 Allergy status to other drugs, medicaments and biological substances status: Secondary | ICD-10-CM

## 2024-11-11 DIAGNOSIS — E785 Hyperlipidemia, unspecified: Secondary | ICD-10-CM | POA: Diagnosis present

## 2024-11-11 DIAGNOSIS — N400 Enlarged prostate without lower urinary tract symptoms: Secondary | ICD-10-CM | POA: Diagnosis not present

## 2024-11-11 DIAGNOSIS — S069X9D Unspecified intracranial injury with loss of consciousness of unspecified duration, subsequent encounter: Secondary | ICD-10-CM

## 2024-11-11 DIAGNOSIS — Z8701 Personal history of pneumonia (recurrent): Secondary | ICD-10-CM

## 2024-11-11 DIAGNOSIS — G8111 Spastic hemiplegia affecting right dominant side: Secondary | ICD-10-CM | POA: Diagnosis present

## 2024-11-11 DIAGNOSIS — S069XAS Unspecified intracranial injury with loss of consciousness status unknown, sequela: Secondary | ICD-10-CM

## 2024-11-11 DIAGNOSIS — E860 Dehydration: Secondary | ICD-10-CM | POA: Diagnosis present

## 2024-11-11 DIAGNOSIS — S069X9S Unspecified intracranial injury with loss of consciousness of unspecified duration, sequela: Secondary | ICD-10-CM

## 2024-11-11 DIAGNOSIS — Z8249 Family history of ischemic heart disease and other diseases of the circulatory system: Secondary | ICD-10-CM

## 2024-11-11 DIAGNOSIS — E871 Hypo-osmolality and hyponatremia: Principal | ICD-10-CM | POA: Diagnosis present

## 2024-11-11 DIAGNOSIS — D61818 Other pancytopenia: Secondary | ICD-10-CM | POA: Diagnosis present

## 2024-11-11 DIAGNOSIS — K760 Fatty (change of) liver, not elsewhere classified: Secondary | ICD-10-CM | POA: Diagnosis present

## 2024-11-11 DIAGNOSIS — F1020 Alcohol dependence, uncomplicated: Secondary | ICD-10-CM | POA: Diagnosis present

## 2024-11-11 DIAGNOSIS — K226 Gastro-esophageal laceration-hemorrhage syndrome: Secondary | ICD-10-CM | POA: Diagnosis present

## 2024-11-11 DIAGNOSIS — Z7901 Long term (current) use of anticoagulants: Secondary | ICD-10-CM

## 2024-11-11 DIAGNOSIS — Z801 Family history of malignant neoplasm of trachea, bronchus and lung: Secondary | ICD-10-CM

## 2024-11-11 DIAGNOSIS — Z87891 Personal history of nicotine dependence: Secondary | ICD-10-CM

## 2024-11-11 DIAGNOSIS — T17908A Unspecified foreign body in respiratory tract, part unspecified causing other injury, initial encounter: Secondary | ICD-10-CM

## 2024-11-11 DIAGNOSIS — F1010 Alcohol abuse, uncomplicated: Principal | ICD-10-CM | POA: Diagnosis present

## 2024-11-11 LAB — COMPREHENSIVE METABOLIC PANEL WITH GFR
ALT: 46 U/L — ABNORMAL HIGH (ref 0–44)
AST: 81 U/L — ABNORMAL HIGH (ref 15–41)
Albumin: 4.4 g/dL (ref 3.5–5.0)
Alkaline Phosphatase: 122 U/L (ref 38–126)
Anion gap: 17 — ABNORMAL HIGH (ref 5–15)
BUN: 5 mg/dL — ABNORMAL LOW (ref 8–23)
CO2: 21 mmol/L — ABNORMAL LOW (ref 22–32)
Calcium: 8.9 mg/dL (ref 8.9–10.3)
Chloride: 84 mmol/L — ABNORMAL LOW (ref 98–111)
Creatinine, Ser: 0.47 mg/dL — ABNORMAL LOW (ref 0.61–1.24)
GFR, Estimated: >60 mL/min
Glucose, Bld: 93 mg/dL (ref 70–99)
Potassium: 3.4 mmol/L — ABNORMAL LOW (ref 3.5–5.1)
Sodium: 122 mmol/L — ABNORMAL LOW (ref 135–145)
Total Bilirubin: 2.1 mg/dL — ABNORMAL HIGH (ref 0.0–1.2)
Total Protein: 7.6 g/dL (ref 6.5–8.1)

## 2024-11-11 LAB — URINALYSIS, ROUTINE W REFLEX MICROSCOPIC
Bacteria, UA: NONE SEEN
Bilirubin Urine: NEGATIVE
Glucose, UA: NEGATIVE mg/dL
Ketones, ur: 20 mg/dL — AB
Leukocytes,Ua: NEGATIVE
Nitrite: NEGATIVE
Protein, ur: 100 mg/dL — AB
Specific Gravity, Urine: 1.025 (ref 1.005–1.030)
pH: 6 (ref 5.0–8.0)

## 2024-11-11 LAB — TROPONIN T, HIGH SENSITIVITY: Troponin T High Sensitivity: 21 ng/L — ABNORMAL HIGH (ref 0–19)

## 2024-11-11 LAB — CBC
HCT: 39.8 % (ref 39.0–52.0)
Hemoglobin: 14 g/dL (ref 13.0–17.0)
MCH: 30.8 pg (ref 26.0–34.0)
MCHC: 35.2 g/dL (ref 30.0–36.0)
MCV: 87.5 fL (ref 80.0–100.0)
Platelets: 117 K/uL — ABNORMAL LOW (ref 150–400)
RBC: 4.55 MIL/uL (ref 4.22–5.81)
RDW: 12.6 % (ref 11.5–15.5)
WBC: 11.9 K/uL — ABNORMAL HIGH (ref 4.0–10.5)
nRBC: 0 % (ref 0.0–0.2)

## 2024-11-11 LAB — LIPASE, BLOOD: Lipase: 29 U/L (ref 11–51)

## 2024-11-11 MED ORDER — LORAZEPAM 1 MG PO TABS: 1.0000 mg | ORAL_TABLET | ORAL | Status: AC | PRN

## 2024-11-11 MED ORDER — ONDANSETRON HCL 4 MG/2ML IJ SOLN
4.0000 mg | Freq: Four times a day (QID) | INTRAMUSCULAR | Status: DC | PRN
  Administered 2024-11-12 – 2024-11-15 (×2): 4 mg via INTRAVENOUS
  Filled 2024-11-11 (×2): qty 2

## 2024-11-11 MED ORDER — THIAMINE HCL 100 MG/ML IJ SOLN
100.0000 mg | Freq: Every day | INTRAMUSCULAR | Status: DC
  Administered 2024-11-12 – 2024-11-15 (×4): 100 mg via INTRAVENOUS
  Filled 2024-11-11 (×4): qty 2

## 2024-11-11 MED ORDER — PANTOPRAZOLE SODIUM 40 MG IV SOLR
40.0000 mg | Freq: Two times a day (BID) | INTRAVENOUS | Status: DC
  Administered 2024-11-12 – 2024-11-14 (×5): 40 mg via INTRAVENOUS
  Filled 2024-11-11 (×6): qty 10

## 2024-11-11 MED ORDER — PANTOPRAZOLE SODIUM 40 MG IV SOLR
40.0000 mg | Freq: Once | INTRAVENOUS | Status: AC
  Administered 2024-11-11: 40 mg via INTRAVENOUS
  Filled 2024-11-11: qty 10

## 2024-11-11 MED ORDER — THIAMINE HCL 100 MG/ML IJ SOLN
100.0000 mg | Freq: Once | INTRAMUSCULAR | Status: AC
  Administered 2024-11-11: 100 mg via INTRAVENOUS
  Filled 2024-11-11: qty 2

## 2024-11-11 MED ORDER — SODIUM CHLORIDE 0.9 % IV SOLN: INTRAVENOUS | Status: AC

## 2024-11-11 MED ORDER — SODIUM CHLORIDE 0.9% FLUSH
3.0000 mL | Freq: Two times a day (BID) | INTRAVENOUS | Status: DC
  Administered 2024-11-11 – 2024-11-15 (×8): 3 mL via INTRAVENOUS

## 2024-11-11 MED ORDER — ONDANSETRON HCL 4 MG/2ML IJ SOLN
4.0000 mg | Freq: Once | INTRAMUSCULAR | Status: AC
  Administered 2024-11-11: 4 mg via INTRAVENOUS
  Filled 2024-11-11: qty 2

## 2024-11-11 MED ORDER — SODIUM CHLORIDE 0.9 % IV BOLUS
1000.0000 mL | Freq: Once | INTRAVENOUS | Status: AC
  Administered 2024-11-11: 1000 mL via INTRAVENOUS

## 2024-11-11 MED ORDER — LORAZEPAM 2 MG/ML IJ SOLN: 1.0000 mg | INTRAMUSCULAR | Status: AC | PRN

## 2024-11-11 MED ORDER — PHENOL 1.4 % MT LIQD
1.0000 | OROMUCOSAL | Status: DC | PRN
  Administered 2024-11-11: 1 via OROMUCOSAL
  Filled 2024-11-11: qty 177

## 2024-11-11 NOTE — ED Triage Notes (Signed)
 Pt BIB [facility] from home C/O emesis X 6 days. Reports noticing blood in vomit yesterday. EMS reports one episode of emesis en route, very small amount of clear fluid.   4mg  Zofran  IM given en route.

## 2024-11-11 NOTE — ED Notes (Signed)
 Called lab again to come draw labs.

## 2024-11-11 NOTE — ED Notes (Signed)
 Labs attempted in triage, unable to obtain.
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
## 2024-11-11 NOTE — ED Notes (Signed)
 Pt roomed. Lab did not come, this RN inserted 24g IV to L inner wrist and sent labs.

## 2024-11-11 NOTE — Progress Notes (Incomplete)
 Douglas Peterson
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
## 2024-11-12 DIAGNOSIS — E785 Hyperlipidemia, unspecified: Secondary | ICD-10-CM | POA: Diagnosis present

## 2024-11-12 DIAGNOSIS — I5032 Chronic diastolic (congestive) heart failure: Secondary | ICD-10-CM | POA: Diagnosis present

## 2024-11-12 DIAGNOSIS — K2100 Gastro-esophageal reflux disease with esophagitis, without bleeding: Secondary | ICD-10-CM | POA: Diagnosis not present

## 2024-11-12 DIAGNOSIS — S069X9D Unspecified intracranial injury with loss of consciousness of unspecified duration, subsequent encounter: Secondary | ICD-10-CM | POA: Diagnosis not present

## 2024-11-12 DIAGNOSIS — E871 Hypo-osmolality and hyponatremia: Secondary | ICD-10-CM | POA: Diagnosis present

## 2024-11-12 DIAGNOSIS — F1020 Alcohol dependence, uncomplicated: Secondary | ICD-10-CM | POA: Diagnosis present

## 2024-11-12 DIAGNOSIS — K7030 Alcoholic cirrhosis of liver without ascites: Secondary | ICD-10-CM | POA: Diagnosis present

## 2024-11-12 DIAGNOSIS — T17908A Unspecified foreign body in respiratory tract, part unspecified causing other injury, initial encounter: Secondary | ICD-10-CM

## 2024-11-12 DIAGNOSIS — T17908D Unspecified foreign body in respiratory tract, part unspecified causing other injury, subsequent encounter: Secondary | ICD-10-CM | POA: Diagnosis not present

## 2024-11-12 DIAGNOSIS — I10 Essential (primary) hypertension: Secondary | ICD-10-CM | POA: Diagnosis not present

## 2024-11-12 DIAGNOSIS — I480 Paroxysmal atrial fibrillation: Secondary | ICD-10-CM | POA: Diagnosis present

## 2024-11-12 DIAGNOSIS — D72829 Elevated white blood cell count, unspecified: Secondary | ICD-10-CM | POA: Diagnosis present

## 2024-11-12 DIAGNOSIS — K226 Gastro-esophageal laceration-hemorrhage syndrome: Secondary | ICD-10-CM | POA: Diagnosis present

## 2024-11-12 DIAGNOSIS — T510X1A Toxic effect of ethanol, accidental (unintentional), initial encounter: Secondary | ICD-10-CM | POA: Diagnosis present

## 2024-11-12 DIAGNOSIS — F32A Depression, unspecified: Secondary | ICD-10-CM | POA: Diagnosis present

## 2024-11-12 DIAGNOSIS — I110 Hypertensive heart disease with heart failure: Secondary | ICD-10-CM | POA: Diagnosis present

## 2024-11-12 DIAGNOSIS — S069XAS Unspecified intracranial injury with loss of consciousness status unknown, sequela: Secondary | ICD-10-CM | POA: Diagnosis not present

## 2024-11-12 DIAGNOSIS — Z8249 Family history of ischemic heart disease and other diseases of the circulatory system: Secondary | ICD-10-CM | POA: Diagnosis not present

## 2024-11-12 DIAGNOSIS — E860 Dehydration: Secondary | ICD-10-CM | POA: Diagnosis present

## 2024-11-12 DIAGNOSIS — Z801 Family history of malignant neoplasm of trachea, bronchus and lung: Secondary | ICD-10-CM | POA: Diagnosis not present

## 2024-11-12 DIAGNOSIS — G8111 Spastic hemiplegia affecting right dominant side: Secondary | ICD-10-CM | POA: Diagnosis present

## 2024-11-12 DIAGNOSIS — N400 Enlarged prostate without lower urinary tract symptoms: Secondary | ICD-10-CM | POA: Diagnosis present

## 2024-11-12 DIAGNOSIS — D61818 Other pancytopenia: Secondary | ICD-10-CM | POA: Diagnosis present

## 2024-11-12 DIAGNOSIS — D6959 Other secondary thrombocytopenia: Secondary | ICD-10-CM | POA: Diagnosis present

## 2024-11-12 DIAGNOSIS — K760 Fatty (change of) liver, not elsewhere classified: Secondary | ICD-10-CM | POA: Diagnosis present

## 2024-11-12 DIAGNOSIS — Z7901 Long term (current) use of anticoagulants: Secondary | ICD-10-CM | POA: Diagnosis not present

## 2024-11-12 DIAGNOSIS — Z87891 Personal history of nicotine dependence: Secondary | ICD-10-CM | POA: Diagnosis not present

## 2024-11-12 DIAGNOSIS — F419 Anxiety disorder, unspecified: Secondary | ICD-10-CM | POA: Diagnosis present

## 2024-11-12 DIAGNOSIS — E876 Hypokalemia: Secondary | ICD-10-CM | POA: Diagnosis present

## 2024-11-12 LAB — BASIC METABOLIC PANEL WITH GFR
Anion gap: 11 (ref 5–15)
Anion gap: 14 (ref 5–15)
Anion gap: 15 (ref 5–15)
Anion gap: 12 (ref 5–15)
BUN: 6 mg/dL — ABNORMAL LOW (ref 8–23)
BUN: 6 mg/dL — ABNORMAL LOW (ref 8–23)
BUN: 6 mg/dL — ABNORMAL LOW (ref 8–23)
BUN: 6 mg/dL — ABNORMAL LOW (ref 8–23)
CO2: 23 mmol/L (ref 22–32)
CO2: 23 mmol/L (ref 22–32)
CO2: 24 mmol/L (ref 22–32)
CO2: 23 mmol/L (ref 22–32)
Calcium: 8.6 mg/dL — ABNORMAL LOW (ref 8.9–10.3)
Calcium: 8.3 mg/dL — ABNORMAL LOW (ref 8.9–10.3)
Calcium: 8.2 mg/dL — ABNORMAL LOW (ref 8.9–10.3)
Calcium: 7.8 mg/dL — ABNORMAL LOW (ref 8.9–10.3)
Chloride: 95 mmol/L — ABNORMAL LOW (ref 98–111)
Chloride: 96 mmol/L — ABNORMAL LOW (ref 98–111)
Chloride: 97 mmol/L — ABNORMAL LOW (ref 98–111)
Chloride: 100 mmol/L (ref 98–111)
Creatinine, Ser: 0.5 mg/dL — ABNORMAL LOW (ref 0.61–1.24)
Creatinine, Ser: 0.51 mg/dL — ABNORMAL LOW (ref 0.61–1.24)
Creatinine, Ser: 0.52 mg/dL — ABNORMAL LOW (ref 0.61–1.24)
Creatinine, Ser: 0.45 mg/dL — ABNORMAL LOW (ref 0.61–1.24)
GFR, Estimated: >60 mL/min
GFR, Estimated: >60 mL/min
GFR, Estimated: >60 mL/min
GFR, Estimated: >60 mL/min
Glucose, Bld: 85 mg/dL (ref 70–99)
Glucose, Bld: 83 mg/dL (ref 70–99)
Glucose, Bld: 80 mg/dL (ref 70–99)
Glucose, Bld: 66 mg/dL — ABNORMAL LOW (ref 70–99)
Potassium: 3.7 mmol/L (ref 3.5–5.1)
Potassium: 4.2 mmol/L (ref 3.5–5.1)
Potassium: 3.4 mmol/L — ABNORMAL LOW (ref 3.5–5.1)
Potassium: 3.3 mmol/L — ABNORMAL LOW (ref 3.5–5.1)
Sodium: 129 mmol/L — ABNORMAL LOW (ref 135–145)
Sodium: 133 mmol/L — ABNORMAL LOW (ref 135–145)
Sodium: 135 mmol/L (ref 135–145)
Sodium: 135 mmol/L (ref 135–145)

## 2024-11-12 LAB — HEPATIC FUNCTION PANEL
ALT: 35 U/L (ref 0–44)
AST: 58 U/L — ABNORMAL HIGH (ref 15–41)
Albumin: 3.8 g/dL (ref 3.5–5.0)
Alkaline Phosphatase: 101 U/L (ref 38–126)
Bilirubin, Direct: 0.9 mg/dL — ABNORMAL HIGH (ref 0.0–0.2)
Indirect Bilirubin: 1 mg/dL — ABNORMAL HIGH (ref 0.3–0.9)
Total Bilirubin: 1.9 mg/dL — ABNORMAL HIGH (ref 0.0–1.2)
Total Protein: 6.4 g/dL — ABNORMAL LOW (ref 6.5–8.1)

## 2024-11-12 LAB — CBC
HCT: 36.1 % — ABNORMAL LOW (ref 39.0–52.0)
Hemoglobin: 12.6 g/dL — ABNORMAL LOW (ref 13.0–17.0)
MCH: 31 pg (ref 26.0–34.0)
MCHC: 34.9 g/dL (ref 30.0–36.0)
MCV: 88.9 fL (ref 80.0–100.0)
Platelets: 82 K/uL — ABNORMAL LOW (ref 150–400)
RBC: 4.06 MIL/uL — ABNORMAL LOW (ref 4.22–5.81)
RDW: 12.8 % (ref 11.5–15.5)
WBC: 8.5 K/uL (ref 4.0–10.5)
nRBC: 0 % (ref 0.0–0.2)

## 2024-11-12 LAB — MAGNESIUM: Magnesium: 1.7 mg/dL (ref 1.7–2.4)

## 2024-11-12 LAB — PROTIME-INR
INR: 1.1 (ref 0.8–1.2)
Prothrombin Time: 15.2 s (ref 11.4–15.2)

## 2024-11-12 MED ORDER — AMLODIPINE BESYLATE 5 MG PO TABS
5.0000 mg | ORAL_TABLET | Freq: Every day | ORAL | Status: DC
  Administered 2024-11-12 – 2024-11-15 (×4): 5 mg via ORAL
  Filled 2024-11-12 (×4): qty 1

## 2024-11-12 MED ORDER — ENSURE ENLIVE PO LIQD
237.0000 mL | Freq: Two times a day (BID) | ORAL | Status: DC
  Administered 2024-11-12 – 2024-11-15 (×5): 237 mL via ORAL
  Filled 2024-11-12: qty 237

## 2024-11-12 MED ORDER — MAGNESIUM SULFATE 2 GM/50ML IV SOLN
2.0000 g | Freq: Once | INTRAVENOUS | Status: AC
  Administered 2024-11-12: 2 g via INTRAVENOUS
  Filled 2024-11-12: qty 50

## 2024-11-12 MED ORDER — POTASSIUM CHLORIDE 10 MEQ/100ML IV SOLN
10.0000 meq | INTRAVENOUS | Status: AC
  Administered 2024-11-12 (×3): 10 meq via INTRAVENOUS
  Filled 2024-11-12 (×3): qty 100

## 2024-11-12 MED ORDER — ATORVASTATIN CALCIUM 20 MG PO TABS
10.0000 mg | ORAL_TABLET | Freq: Every day | ORAL | Status: DC
  Administered 2024-11-12 – 2024-11-15 (×4): 10 mg via ORAL
  Filled 2024-11-12 (×4): qty 1

## 2024-11-12 MED ORDER — LACTULOSE 10 GM/15ML PO SOLN
20.0000 g | Freq: Three times a day (TID) | ORAL | Status: DC
  Administered 2024-11-12 – 2024-11-15 (×8): 20 g via ORAL
  Filled 2024-11-12 (×9): qty 30

## 2024-11-12 NOTE — Assessment & Plan Note (Signed)
 Intractable nausea and vomiting. Recent EGD with esophagitis, he was concern of some bleeding in his vomitus yesterday, no further bleeding, vomiting has been improved.  Hemoglobin stable. -Continue with PPI -Continue with supportive care

## 2024-11-12 NOTE — Assessment & Plan Note (Signed)
 Continue atorvastatin 

## 2024-11-12 NOTE — Assessment & Plan Note (Signed)
 Patient with a history of chronic pharyngeal dysphagia and silent aspiration.  He was seen by swallow team and palliative care multiple times during most recent hospitalizations but he wants to continue eating knowing the risk.  He also revoked his DNR during prior hospitalization and wants to be full code.  - Risk was again explained to him -Continue with regular diet

## 2024-11-12 NOTE — Hospital Course (Addendum)
 Partly taken from H&P.   Douglas Peterson. is a 74 y.o. male with medical history significant of hypertension, hyperlipidemia, GERD, TBI, right spastic hemiparesis, paroxysmal defibrillation, CHF, depression, anxiety, esophagitis, alcohol use, cirrhosis, withdrawals, thrombocytopenia, BPH presenting with nausea and vomiting.   Admitted 11/8-11/18 with abdominal pain found to have stercoral colitis so on antibiotics and a bowel regimen with improvement. Seen by GI and had EGD which showed GERD with esophagitis. Was noted during admission that he has been seen by speech therapy in the past with prior recommendations for n.p.o. due to significant silent aspiration risk. Patient has opted to continue to eat and drink despite this in the past that he did not agree to be n.p.o. so that he could continue to eat and drink.   On presentation elevated blood pressure, labs with sodium 122, chloride 84, potassium 3.4, bicarb 21, AST 81, ALT 46, T. bili 2.1. CBC with leukocytosis to 11.9, platelets 117. Troponin 21, repeat pending. Lipase normal. Urinalysis with hemoglobin, ketones, protein.  Chest x-ray with no acute abnormality.  Patient was admitted for hyponatremia along with intractable nausea and vomiting.  1/10: Vitals stable, on room air, sodium normalized now at 135, mild hypokalemia with potassium at 3.4, improving AST and T. Bili. PT and OT evaluation ordered. Started on diet as nausea and vomiting improving.  1/11: Hemodynamically stable, potassium of 3 with magnesium  of 1.8-replating both electrolytes. PT is recommending SNF as he was very unstable on his feet.  Asked TOC to see if that is a option for him because of his prior history.

## 2024-11-12 NOTE — Assessment & Plan Note (Signed)
 Clinically appears euvolemic.  Not on any medications at home. - Continue to monitor

## 2024-11-12 NOTE — Progress Notes (Signed)
 SLP Cancellation Note  Patient Details Name: Douglas Peterson. MRN: [id number] DOB: 02-23-1951   Cancelled treatment:       Reason Eval/Treat Not Completed:  (chart reviewed; consulted MD re: pt's swallowing history)   Order received for determining diet consistency for pt. Per chart notes and previous SLP contacts/assessments: Pt is known to our services - per chart he has had 7 instrumental swallow studies with recommendations made for NPO d/t severity of oropharyngeal dysphagia and silent aspiration. At this time, it is outside of ST abilities to be impactful..  This was d/w MD via secure chat. A diet order is already in place per MD- recommended f/u w/ pt re: his GOC and decisions moving forward. MD to reconsult if new swallowing issue/question arises during admit.      [name], MS, CCC-SLP Speech Language Pathologist Rehab Services; [facility] [phone] (ascom) [name] 11/12/2024, 2:22 PM

## 2024-11-12 NOTE — Assessment & Plan Note (Signed)
 Platelets at 82 today. - History of alcohol abuse and cirrhosis. -Continue to monitor

## 2024-11-12 NOTE — Assessment & Plan Note (Signed)
 Patient was prescribed lactulose  but was not taking it. -Restarting lactulose  as he did not had any BM since in the hospital

## 2024-11-12 NOTE — Assessment & Plan Note (Signed)
 Not on any medication or anticoagulation at home.

## 2024-11-12 NOTE — Assessment & Plan Note (Signed)
 Blood pressure elevated today. Patient was not taking his home amlodipine . - Restarting home amlodipine 
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
## 2024-11-12 NOTE — Discharge Instructions (Signed)
?  Outpatient Substance Use Treatment Services  ? ?[facility]  ?[facility] ?[address] ?[place], [place] [address]  ?[phone] ?Private insurance, Medicare A&B, and GCCN  ? ?[facility]  ?[address]  ?[place], [place] [address] ?[phone] ?Medicaid, Self Pay  ? ?[facility]      ?[address]  ?[place], [place] ?[phone] ?Medicaid and Private Insurance, Self Pay  ? ?[facility] ?[address]  ?[place], [place]  ?[phone] ?Private Insurance, and Self Pay ? ?[facility]      ?[address]    ?[place], [place] [address]  ?[phone] or [phone] ?Private Insurance Only  ? ? ? ?[facility] ?[address]  ?[place], [place] [address] ?[phone] ?Medicaid, Medicare, Private Insurance ? ?[facility] ?[address]  ?[place], [place] [address] ?[phone] ?Services are free or reduced ? ?[facility]  ?[address] ?[phone]  ?Self Pay only, sliding scale ? ?[facility]  ?[address]  ?[place], [place] [address] ?[phone] ?([facility]) ?Self Pay, Medicaid Only  ? ?[facility] ?[address]  ?, [place] [address] ?[phone] ?Medicaid, Medicare, Private Insurance ? ? ? ? ? ? ?

## 2024-11-12 NOTE — Assessment & Plan Note (Signed)
 Likely secondary to dehydration with nausea and vomiting.  Has been improved with IV fluid. - Continue to monitor

## 2024-11-12 NOTE — Assessment & Plan Note (Signed)
 Continue to drink alcohol.  History of alcohol withdrawal and DTs. - Continue with CIWA protocol -Counseling was provided

## 2024-11-12 NOTE — ED Notes (Signed)
 Hospitalist at bedside

## 2024-11-13 DIAGNOSIS — E785 Hyperlipidemia, unspecified: Secondary | ICD-10-CM | POA: Diagnosis not present

## 2024-11-13 DIAGNOSIS — G8111 Spastic hemiplegia affecting right dominant side: Secondary | ICD-10-CM | POA: Diagnosis not present

## 2024-11-13 DIAGNOSIS — F1020 Alcohol dependence, uncomplicated: Secondary | ICD-10-CM | POA: Diagnosis not present

## 2024-11-13 DIAGNOSIS — I5032 Chronic diastolic (congestive) heart failure: Secondary | ICD-10-CM | POA: Diagnosis not present

## 2024-11-13 DIAGNOSIS — E876 Hypokalemia: Secondary | ICD-10-CM

## 2024-11-13 DIAGNOSIS — I480 Paroxysmal atrial fibrillation: Secondary | ICD-10-CM | POA: Diagnosis not present

## 2024-11-13 DIAGNOSIS — E871 Hypo-osmolality and hyponatremia: Secondary | ICD-10-CM | POA: Diagnosis not present

## 2024-11-13 DIAGNOSIS — N400 Enlarged prostate without lower urinary tract symptoms: Secondary | ICD-10-CM | POA: Diagnosis not present

## 2024-11-13 DIAGNOSIS — D6959 Other secondary thrombocytopenia: Secondary | ICD-10-CM | POA: Diagnosis not present

## 2024-11-13 DIAGNOSIS — I10 Essential (primary) hypertension: Secondary | ICD-10-CM | POA: Diagnosis not present

## 2024-11-13 DIAGNOSIS — K2100 Gastro-esophageal reflux disease with esophagitis, without bleeding: Secondary | ICD-10-CM | POA: Diagnosis not present

## 2024-11-13 DIAGNOSIS — K7030 Alcoholic cirrhosis of liver without ascites: Secondary | ICD-10-CM | POA: Diagnosis not present

## 2024-11-13 LAB — CBC
HCT: 35.7 % — ABNORMAL LOW (ref 39.0–52.0)
Hemoglobin: 12.1 g/dL — ABNORMAL LOW (ref 13.0–17.0)
MCH: 30.6 pg (ref 26.0–34.0)
MCHC: 33.9 g/dL (ref 30.0–36.0)
MCV: 90.2 fL (ref 80.0–100.0)
Platelets: 83 K/uL — ABNORMAL LOW (ref 150–400)
RBC: 3.96 MIL/uL — ABNORMAL LOW (ref 4.22–5.81)
RDW: 12.9 % (ref 11.5–15.5)
WBC: 6 K/uL (ref 4.0–10.5)
nRBC: 0 % (ref 0.0–0.2)

## 2024-11-13 LAB — RENAL FUNCTION PANEL
Albumin: 4 g/dL (ref 3.5–5.0)
Anion gap: 12 (ref 5–15)
BUN: <5 mg/dL — ABNORMAL LOW (ref 8–23)
CO2: 26 mmol/L (ref 22–32)
Calcium: 8.7 mg/dL — ABNORMAL LOW (ref 8.9–10.3)
Chloride: 95 mmol/L — ABNORMAL LOW (ref 98–111)
Creatinine, Ser: 0.47 mg/dL — ABNORMAL LOW (ref 0.61–1.24)
GFR, Estimated: >60 mL/min
Glucose, Bld: 87 mg/dL (ref 70–99)
Phosphorus: 2.1 mg/dL — ABNORMAL LOW (ref 2.5–4.6)
Potassium: 3 mmol/L — ABNORMAL LOW (ref 3.5–5.1)
Sodium: 133 mmol/L — ABNORMAL LOW (ref 135–145)

## 2024-11-13 LAB — MAGNESIUM: Magnesium: 1.8 mg/dL (ref 1.7–2.4)

## 2024-11-13 MED ORDER — MAGNESIUM SULFATE 2 GM/50ML IV SOLN
2.0000 g | Freq: Once | INTRAVENOUS | Status: AC
  Administered 2024-11-13: 2 g via INTRAVENOUS
  Filled 2024-11-13: qty 50

## 2024-11-13 MED ORDER — BENZONATATE 100 MG PO CAPS
100.0000 mg | ORAL_CAPSULE | Freq: Three times a day (TID) | ORAL | Status: DC | PRN
  Administered 2024-11-14 (×2): 100 mg via ORAL
  Filled 2024-11-13 (×2): qty 1

## 2024-11-13 MED ORDER — ALUM & MAG HYDROXIDE-SIMETH 200-200-20 MG/5ML PO SUSP
30.0000 mL | Freq: Once | ORAL | Status: AC
  Administered 2024-11-13: 30 mL via ORAL
  Filled 2024-11-13: qty 30

## 2024-11-13 MED ORDER — K PHOS MONO-SOD PHOS DI & MONO 155-852-130 MG PO TABS
500.0000 mg | ORAL_TABLET | Freq: Two times a day (BID) | ORAL | Status: AC
  Administered 2024-11-13 (×2): 500 mg via ORAL
  Filled 2024-11-13 (×2): qty 2

## 2024-11-13 NOTE — Assessment & Plan Note (Signed)
 Likely secondary to dehydration with nausea and vomiting.  Has been improved with IV fluid.  Sodium at 133 today - Continue to monitor

## 2024-11-13 NOTE — Progress Notes (Signed)
" ° °  Brief Progress Note   _____________________________________________________________________________________________________________  Patient Name: Douglas Peterson. Patient DOB: 19-Oct-1951 Date: @TODAY @      Data: Reviewed labs, notes, VS.    Action: No action needed at this time.      Response:    _____________________________________________________________________________________________________________  [facility] RN Expeditor Sharolyn JONETTA Batman Please contact us  directly via secure chat (search for [facility]) or by calling us  at [phone] [facility]).  "

## 2024-11-13 NOTE — Assessment & Plan Note (Signed)
 Potassium of 3 with magnesium  of 1.8 today -Replating both potassium and magnesium  -Continue to monitor
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
## 2024-11-13 NOTE — Evaluation (Signed)
 Occupational Therapy Evaluation Patient Details Name: Douglas Peterson. MRN: [id number] DOB: [birth year] Today's Date: 11/13/2024   History of Present Illness   74 y/o male presented to ED on 11/11/24 for nausea and vomiting x 6 days. Admitted for hyponatremia. PMH: HTN, TBI, R spastic hemiparesis, CHF, depression, alcohol use disorder, esophagitis, cirrhosis, BPH, anxiety     Clinical Impressions Patient was seen for OT evaluation this date. Prior to hospital admission, patient was living along and managing ADLS/IADLs without A. Patient sitting EOB upon OT arrival sit<>stand with min A a  HHA, ambulated 10 feet to use BSC, required increased time to void, required A to manage clothing and perineal hygiene. Demonstrates ability to perform single armed LB dressing tasks with increased time and effort. Endorsed not feeling well but unable to provide specifics.  Patient presents with deficits in overall activity tolerance and dynamic standing balance, affecting safe and optimal ADL completion. [name] currently requiring mod A for ADLs (toileting and standing tasks).  Paient would benefit from skilled OT services to address noted impairments and functional limitations (see below for any additional details) in order to maximize safety and independence while minimizing future risk of falls, injury, and readmission.  Anticipate the need for follow up OT services upon acute hospital DC.      If plan is discharge home, recommend the following:   A little help with walking and/or transfers;A little help with bathing/dressing/bathroom;Assistance with cooking/housework;Direct supervision/assist for medications management     Functional Status Assessment   Patient has had a recent decline in their functional status and demonstrates the ability to make significant improvements in function in a reasonable and predictable amount of time.     Equipment Recommendations   None recommended by OT      Recommendations for Other Services         Precautions/Restrictions   Precautions Precautions: Fall Recall of Precautions/Restrictions: Intact Restrictions Weight Bearing Restrictions Per Provider Order: No     Mobility Bed Mobility               General bed mobility comments: sitting EOB pre and post session    Transfers Overall transfer level: Needs assistance Equipment used: 1 person hand held assist Transfers: Sit to/from Stand Sit to Stand: Mod assist           General transfer comment: lifting A needed from [facility]      Balance Overall balance assessment: Needs assistance Sitting-balance support: No upper extremity supported, Feet supported Sitting balance-Leahy Scale: Good     Standing balance support: Single extremity supported, During functional activity Standing balance-Leahy Scale: Poor                             ADL either performed or assessed with clinical judgement   ADL Overall ADL's : Needs assistance/impaired                                       General ADL Comments: patient was able to perform LB dressing with increased time; required mod A for toileting from [facility] anticipate needing min-mod A for ADLs due to deconditioning     Vision         Perception         Praxis         Pertinent Vitals/Pain Pain Assessment Pain Assessment: No/denies pain  Extremity/Trunk Assessment Upper Extremity Assessment Upper Extremity Assessment: RUE deficits/detail RUE Deficits / Details: 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   Lower Extremity Assessment Lower Extremity Assessment: Generalized weakness   Cervical / Trunk Assessment Cervical / Trunk Assessment: Kyphotic   Communication Communication Communication: Impaired Factors Affecting Communication: Difficulty expressing self;Reduced clarity of speech   Cognition Arousal: Alert Behavior During Therapy: WFL for tasks  assessed/performed Cognition: No apparent impairments                               Following commands: Intact       Cueing  General Comments   Cueing Techniques: Verbal cues      Exercises     Shoulder Instructions      Home Living Family/patient expects to be discharged to:: Private residence Living Arrangements: Alone Available Help at Discharge: Neighbor;Available PRN/intermittently Type of Home: House Home Access: Stairs to enter [employer] of Steps: 3 Entrance Stairs-Rails: Can reach both Home Layout: One level     Bathroom Shower/Tub: Chief Strategy Officer: Standard     Home Equipment: Cane - single point;Wheelchair - manual          Prior Functioning/Environment Prior Level of Function : Independent/Modified Independent             Mobility Comments: Pt reports use of SPC for short distance indoor mobility. Also reports transferring to/from manual wheelchair and uses it for mobility at times. ADLs Comments: Pt endorses having more difficulty taking care of himself recently, groceries delivered, neighbor helps with transport PRN    OT Problem List: Decreased strength;Decreased activity tolerance;Impaired balance (sitting and/or standing)   OT Treatment/Interventions: Self-care/ADL training;Therapeutic exercise;Energy conservation;DME and/or AE instruction;Therapeutic activities;Balance training      OT Goals(Current goals can be found in the care plan section)   Acute Rehab OT Goals Patient Stated Goal: to go home OT Goal Formulation: With patient Time For Goal Achievement: 11/27/24 Potential to Achieve Goals: Good ADL Goals Pt Will Perform Grooming: with modified independence;standing Pt Will Perform Lower Body Dressing: with modified independence;sit to/from stand Pt Will Transfer to Toilet: with modified independence;ambulating;regular height toilet Pt Will Perform Toileting - Clothing Manipulation and  hygiene: with modified independence;sit to/from stand   OT Frequency:  Min 2X/week    Co-evaluation              AM-PAC OT 6 Clicks Daily Activity     Outcome Measure Help from another person eating meals?: None Help from another person taking care of personal grooming?: A Little Help from another person toileting, which includes using toliet, bedpan, or urinal?: A Little Help from another person bathing (including washing, rinsing, drying)?: A Little Help from another person to put on and taking off regular upper body clothing?: A Little Help from another person to put on and taking off regular lower body clothing?: A Little 6 Click Score: 19   End of Session Nurse Communication: Mobility status  Activity Tolerance: Patient tolerated treatment well Patient left: in bed;with call bell/phone within reach;with bed alarm set  OT Visit Diagnosis: Unsteadiness on feet (R26.81);Other abnormalities of gait and mobility (R26.89);Muscle weakness (generalized) (M62.81)                Time: 9090-9072 OT Time Calculation (min): 18 min Charges:  OT General Charges $OT Visit: 1 Visit OT Evaluation $OT Eval Low Complexity: 1 Low  Rogers Clause, OT/L MSOT, 11/13/2024

## 2024-11-13 NOTE — Progress Notes (Signed)
 " Progress Note   Patient: Douglas Peterson. FMW:[id number] DOB: April 19, 1951 DOA: 11/11/2024     1 DOS: the patient was seen and examined on 11/13/2024   Brief hospital course: Partly taken from H&P.   Douglas Peterson. is a 74 y.o. male with medical history significant of hypertension, hyperlipidemia, GERD, TBI, right spastic hemiparesis, paroxysmal defibrillation, CHF, depression, anxiety, esophagitis, alcohol use, cirrhosis, withdrawals, thrombocytopenia, BPH presenting with nausea and vomiting.   Admitted 11/8-11/18 with abdominal pain found to have stercoral colitis so on antibiotics and a bowel regimen with improvement. Seen by GI and had EGD which showed GERD with esophagitis. Was noted during admission that he has been seen by speech therapy in the past with prior recommendations for n.p.o. due to significant silent aspiration risk. Patient has opted to continue to eat and drink despite this in the past that he did not agree to be n.p.o. so that he could continue to eat and drink.   On presentation elevated blood pressure, labs with sodium 122, chloride 84, potassium 3.4, bicarb 21, AST 81, ALT 46, T. bili 2.1. CBC with leukocytosis to 11.9, platelets 117. Troponin 21, repeat pending. Lipase normal. Urinalysis with hemoglobin, ketones, protein.  Chest x-ray with no acute abnormality.  Patient was admitted for hyponatremia along with intractable nausea and vomiting.  1/10: Vitals stable, on room air, sodium normalized now at 135, mild hypokalemia with potassium at 3.4, improving AST and T. Bili. PT and OT evaluation ordered. Started on diet as nausea and vomiting improving.  1/11: Hemodynamically stable, potassium of 3 with magnesium  of 1.8-replating both electrolytes. PT is recommending SNF as he was very unstable on his feet.  Asked TOC to see if that is a option for him because of his prior history.  Assessment and Plan: * Hyponatremia Likely secondary to dehydration with nausea and  vomiting.  Has been improved with IV fluid.  Sodium at 133 today - Continue to monitor  GERD with esophagitis Intractable nausea and vomiting. Recent EGD with esophagitis, he was concern of some bleeding in his vomitus yesterday, no further bleeding, vomiting has been improved.  Hemoglobin stable. 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 - History of alcohol abuse and cirrhosis. -Continue to monitor  Chronic diastolic CHF (congestive heart failure) (HCC) Clinically appears euvolemic.  Not on any medications at home. - Continue to monitor  ETOH abuse Continue to drink alcohol.  History of alcohol withdrawal and DTs. - Continue with CIWA protocol -Counseling was provided  Essential hypertension Blood pressure elevated today. Patient was not taking his home amlodipine . - Restarting home amlodipine   Paroxysmal A-fib (HCC) Not on any medication or anticoagulation at home.  Dyslipidemia - Continue atorvastatin   Alcoholic cirrhosis of liver without ascites (HCC) Patient was prescribed lactulose  but was not taking it. -Restarting lactulose  as he did not had any BM since in the hospital  Chronic pulmonary aspiration Patient with a history of chronic pharyngeal dysphagia and silent aspiration.  He was seen by swallow team and palliative care multiple times during most recent hospitalizations but he wants to continue eating knowing the risk.  He also revoked his DNR during prior hospitalization and wants to be full code.  - Risk was again explained to him -Continue with regular diet   Subjective: Patient was seen and examined today.  He was complaining that he lost his show and asking to get him new one.  Also having some abdominal discomfort, no nausea or vomiting.   Tolerating food.  Physical Exam: Vitals:   11/13/24 1031 11/13/24 1125 11/13/24 1339 11/13/24 1441  BP: (!) 147/74 (!) 147/74 138/75   Pulse: 82 82 83   Resp: (!) 24  (!) 27   Temp: 98.2 F (36.8 C)   98.3 F (36.8 C)  TempSrc: Oral   Oral  SpO2: 95%  96%    General.  Frail and malnourished elderly man, in no acute distress. Pulmonary.  Lungs clear bilaterally, normal respiratory effort. CV.  Regular rate and rhythm, no JVD, rub or murmur. Abdomen.  Soft, nontender, nondistended, BS positive. CNS.  Alert and oriented .  Right hemiparesis.   Extremities.  No edema, pulses intact and symmetrical. Psychiatry.  Judgment and insight appears normal.   Data Reviewed: Prior data reviewed  Family Communication: Discussed with patient  Disposition: Status is: Inpatient Remains inpatient appropriate because: Severity of illness  Planned Discharge Destination: Home  DVT prophylaxis.  SCDs Time spent: 45 minutes  This record has been created using Conservation officer, historic buildings. Errors have been sought and corrected,but may not always be located. Such creation errors do not reflect on the standard of care.   Author: Amaryllis Dare, MD 11/13/2024 2:47 PM  For on call review [email or web address].  "

## 2024-11-13 NOTE — Evaluation (Signed)
 Physical Therapy Evaluation Patient Details Name: Douglas Peterson. MRN: [id number] DOB: 1951/10/17 Today's Date: 11/13/2024  History of Present Illness  74 y/o male presented to ED on 11/11/24 for nausea and vomiting x 6 days. Admitted for hyponatremia. PMH: HTN, TBI, R spastic hemiparesis, CHF, depression, alcohol use disorder, esophagitis, cirrhosis, BPH, anxiety  Clinical Impression  Patient admitted with the above. PTA, patient lives alone and was modI with use of SPC at home. Neighbor assists PRN. Patient sitting EOB on arrival and agreeable to PT session. Complaining of only having 1 shoe from home. Completed sit to stand with minA. Ambulated ~60' with HHAx1 and minA to prevent posterior LOB throughout ambulation. Currently, patient is not safe to return home alone as he is requiring frequent assist with ambulation. Patient will benefit from skilled PT services during acute stay to address listed deficits. Patient will benefit from ongoing therapy at discharge to maximize functional independence and safety.         If plan is discharge home, recommend the following: A little help with walking and/or transfers;A little help with bathing/dressing/bathroom;Assistance with cooking/housework;Assist for transportation;Help with stairs or ramp for entrance   Can travel by private vehicle        Equipment Recommendations None recommended by PT  Recommendations for Other Services       Functional Status Assessment Patient has had a recent decline in their functional status and demonstrates the ability to make significant improvements in function in a reasonable and predictable amount of time.     Precautions / Restrictions Precautions Precautions: Fall Recall of Precautions/Restrictions: Intact Restrictions Weight Bearing Restrictions Per Provider Order: No      Mobility  Bed Mobility               General bed mobility comments: sitting EOB pre and post session     Transfers Overall transfer level: Needs assistance Equipment used: 1 person hand held assist Transfers: Sit to/from Stand Sit to Stand: Min assist                Ambulation/Gait Ambulation/Gait assistance: Min assist Gait Distance (Feet): 60 Feet Assistive device: 1 person hand held assist Gait Pattern/deviations: Decreased stride length, Step-to pattern, Trunk flexed Gait velocity: decreased     General Gait Details: assist to prevent and recover from LOB. Posterior bias consistently during ambulation. Utilized HHA in place of [facility] this date. Complaining about only having 1 shoe from home  Stairs            Wheelchair Mobility     Tilt Bed    Modified Rankin (Stroke Patients Only)       Balance Overall balance assessment: Needs assistance Sitting-balance support: No upper extremity supported, Feet supported Sitting balance-Leahy Scale: Good     Standing balance support: Single extremity supported, During functional activity Standing balance-Leahy Scale: Poor                               Pertinent Vitals/Pain Pain Assessment Pain Assessment: No/denies pain    Home Living Family/patient expects to be discharged to:: Private residence Living Arrangements: Alone Available Help at Discharge: Neighbor;Available PRN/intermittently Type of Home: House Home Access: Stairs to enter Entrance Stairs-Rails: Can reach both Entrance Stairs-Number of Steps: 3   Home Layout: One level Home Equipment: Cane - single point;Wheelchair - manual      Prior Function Prior Level of Function : Independent/Modified Independent  Mobility Comments: Pt reports use of SPC for short distance indoor mobility. Also reports transferring to/from manual wheelchair and uses it for mobility at times. ADLs Comments: Pt endorses having more difficulty taking care of himself recently, groceries delivered, neighbor helps with transport PRN      Extremity/Trunk Assessment   Upper Extremity Assessment Upper Extremity Assessment: Defer to OT evaluation    Lower Extremity Assessment Lower Extremity Assessment: Generalized weakness (baseline R LE weakness)    Cervical / Trunk Assessment Cervical / Trunk Assessment: Kyphotic  Communication   Communication Communication: Impaired Factors Affecting Communication: Difficulty expressing self;Reduced clarity of speech    Cognition Arousal: Alert Behavior During Therapy: WFL for tasks assessed/performed   PT - Cognitive impairments: History of cognitive impairments                         Following commands: Intact       Cueing Cueing Techniques: Verbal cues     General Comments      Exercises     Assessment/Plan    PT Assessment Patient needs continued PT services  PT Problem List Decreased strength;Decreased activity tolerance;Decreased balance;Decreased mobility;Decreased coordination;Decreased cognition;Decreased knowledge of use of DME;Decreased safety awareness;Decreased knowledge of precautions       PT Treatment Interventions DME instruction;Gait training;Functional mobility training;Therapeutic activities;Therapeutic exercise;Balance training;Stair training;Neuromuscular re-education;Patient/family education    PT Goals (Current goals can be found in the Care Plan section)  Acute Rehab PT Goals Patient Stated Goal: to go home when I get stronger PT Goal Formulation: With patient Time For Goal Achievement: 11/27/24 Potential to Achieve Goals: Fair    Frequency Min 2X/week     Co-evaluation               AM-PAC PT 6 Clicks Mobility  Outcome Measure Help needed turning from your back to your side while in a flat bed without using bedrails?: A Little Help needed moving from lying on your back to sitting on the side of a flat bed without using bedrails?: A Little Help needed moving to and from a bed to a chair (including a  wheelchair)?: A Little Help needed standing up from a chair using your arms (e.g., wheelchair or bedside chair)?: A Little Help needed to walk in hospital room?: A Little Help needed climbing 3-5 steps with a railing? : A Little 6 Click Score: 18    End of Session   Activity Tolerance: Patient tolerated treatment well Patient left: in bed;with call bell/phone within reach Nurse Communication: Mobility status PT Visit Diagnosis: Unsteadiness on feet (R26.81);Muscle weakness (generalized) (M62.81);Other abnormalities of gait and mobility (R26.89)    Time: 0840-0900 PT Time Calculation (min) (ACUTE ONLY): 20 min   Charges:   PT Evaluation $PT Eval Moderate Complexity: 1 Mod   PT General Charges $$ ACUTE PT VISIT: 1 Visit         Maryanne Finder, PT, DPT Physical Therapist - [facility]  [facility]  Anila Bojarski A Jonalyn Sedlak 11/13/2024, 9:23 AM

## 2024-11-14 ENCOUNTER — Ambulatory Visit

## 2024-11-14 DIAGNOSIS — E871 Hypo-osmolality and hyponatremia: Secondary | ICD-10-CM | POA: Diagnosis not present

## 2024-11-14 LAB — RENAL FUNCTION PANEL
Albumin: 3.8 g/dL (ref 3.5–5.0)
Anion gap: 11 (ref 5–15)
BUN: 9 mg/dL (ref 8–23)
CO2: 28 mmol/L (ref 22–32)
Calcium: 8.6 mg/dL — ABNORMAL LOW (ref 8.9–10.3)
Chloride: 101 mmol/L (ref 98–111)
Creatinine, Ser: 0.56 mg/dL — ABNORMAL LOW (ref 0.61–1.24)
GFR, Estimated: >60 mL/min
Glucose, Bld: 115 mg/dL — ABNORMAL HIGH (ref 70–99)
Phosphorus: 3.7 mg/dL (ref 2.5–4.6)
Potassium: 3.2 mmol/L — ABNORMAL LOW (ref 3.5–5.1)
Sodium: 140 mmol/L (ref 135–145)

## 2024-11-14 LAB — MAGNESIUM: Magnesium: 1.9 mg/dL (ref 1.7–2.4)

## 2024-11-14 MED ORDER — POTASSIUM CHLORIDE 20 MEQ PO PACK
60.0000 meq | PACK | Freq: Once | ORAL | Status: DC
  Filled 2024-11-14: qty 3

## 2024-11-14 MED ORDER — MAGNESIUM SULFATE IN D5W 1-5 GM/100ML-% IV SOLN
1.0000 g | Freq: Once | INTRAVENOUS | Status: AC
  Administered 2024-11-14: 1 g via INTRAVENOUS
  Filled 2024-11-14: qty 100

## 2024-11-14 MED ORDER — PANTOPRAZOLE SODIUM 40 MG PO TBEC
40.0000 mg | DELAYED_RELEASE_TABLET | Freq: Two times a day (BID) | ORAL | Status: DC
  Administered 2024-11-14 – 2024-11-15 (×2): 40 mg via ORAL
  Filled 2024-11-14 (×2): qty 1

## 2024-11-14 MED ORDER — POTASSIUM CHLORIDE CRYS ER 20 MEQ PO TBCR
40.0000 meq | EXTENDED_RELEASE_TABLET | Freq: Once | ORAL | Status: AC
  Administered 2024-11-14: 40 meq via ORAL
  Filled 2024-11-14: qty 2

## 2024-11-14 NOTE — ED Notes (Signed)
 Report received from off going RN. Pt resting in bed, requests to turn temperature in room up. Offered additional blanket but pt declined. Temperature adjusted, lights dimmed, denies any additional needs at this time, call bell within reach, NAD noted, bed in lowest, locked position

## 2024-11-14 NOTE — ED Notes (Signed)
 Spoke to pt about impending discharge. Pt said he's unable to get around without help and lives alone. He is concerned about being discharged and said he was told by PT they would work with him more upstairs. MD notified of pt concerns- awaiting response.

## 2024-11-14 NOTE — ED Notes (Signed)
 Pt assisted to bedside commode
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
## 2024-11-14 NOTE — Progress Notes (Signed)
 " Progress Note   Patient: Douglas Peterson. FMW:[id number] DOB: [birth year] DOA: 11/11/2024     2 DOS: the patient was seen and examined on 11/14/2024   Brief hospital course:   Douglas Peterson. is a 74 y.o. male with medical history significant of hypertension, hyperlipidemia, GERD, TBI, right spastic hemiparesis, paroxysmal defibrillation, CHF, depression, anxiety, esophagitis, alcohol use, cirrhosis, withdrawals, thrombocytopenia, BPH presenting with nausea and vomiting.    Admitted 11/8-11/18 with abdominal pain found to have stercoral colitis so on antibiotics and a bowel regimen with improvement. Seen by GI and had EGD which showed GERD with esophagitis. Was noted during admission that he has been seen by speech therapy in the past with prior recommendations for n.p.o. due to significant silent aspiration risk. Patient has opted to continue to eat and drink despite this in the past that he did not agree to be n.p.o. so that he could continue to eat and drink.    On presentation elevated blood pressure, labs with sodium 122, chloride 84, potassium 3.4, bicarb 21, AST 81, ALT 46, T. bili 2.1. CBC with leukocytosis to 11.9, platelets 117. Troponin 21, repeat pending. Lipase normal. Urinalysis with hemoglobin, ketones, protein.  Chest x-ray with no acute abnormality.   Patient was admitted for hyponatremia along with intractable nausea and vomiting.   1/10: Vitals stable, on room air, sodium normalized now at 135, mild hypokalemia with potassium at 3.4, improving AST and T. Bili. PT and OT evaluation ordered. Started on diet as nausea and vomiting improving.   1/11: Hemodynamically stable, potassium of 3 with magnesium  of 1.8-replating both electrolytes. PT is recommending SNF as he was very unstable on his feet.  Asked TOC to see if that is a option for him because of his prior history.  Assessment and Plan: * Hyponatremia Likely secondary to dehydration with nausea and vomiting.  Resolved  with IV fluid.     GERD with esophagitis Intractable nausea and vomiting. 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  Not on any medications at home. - Continue to monitor  ETOH abuse Continue to drink alcohol.  History of alcohol withdrawal and DTs. - Continue with CIWA protocol, vitamins. No signs withdrawal today  Essential hypertension Bp wnl today - Restarting home amlodipine   Paroxysmal A-fib (HCC) Not on any medication or anticoagulation at home.  Dyslipidemia - Continue atorvastatin   Alcoholic cirrhosis of liver without ascites (HCC) Patient was prescribed lactulose  but was not taking it. -restarted lactulose   Chronic pulmonary aspiration Patient with a history of chronic pharyngeal dysphagia and silent aspiration.  He was seen by swallow team and palliative care multiple times during most recent hospitalizations but he wants to continue eating knowing the risk. He also revoked his DNR during prior hospitalization and wants to be full code.  - Risk was again explained to him -Continue with regular diet  Debility Agreeable to [facility] only [facility], per TOC they have offered him a bed and has auth, can go tomorrow   Subjective: sitting up in bed eating breakfast. No complaints  Physical Exam: Vitals:   11/14/24 1400 11/14/24 1430 11/14/24 1500 11/14/24 1530  BP: (!) 161/85 (!) 184/90 (!) 140/84 (!) 150/81  Pulse: 93 (!) 108 91 99  Resp: (!) 21 (!) 25 (!) 24 (!) 21  Temp:      TempSrc:      SpO2: 96% 96% 99% 98%   General.  Frail and malnourished elderly man, in no acute distress. Pulmonary.   Lungs clear bilaterally, normal respiratory effort. CV.  Regular rate and rhythm, no JVD, rub or murmur. Abdomen.  Soft, nontender, nondistended,  CNS.  Alert and oriented .  Right hemiparesis.   Extremities.  No edema, pulses intact and symmetrical. Psychiatry.  Judgment and insight appears normal.   Data Reviewed: Prior data reviewed  Family Communication: Discussed with patient  Disposition: Status is: Inpatient Remains inpatient appropriate because: Severity of illness  Planned Discharge Destination: Home  DVT prophylaxis.  SCDs  Author: Devaughn KATHEE Ban, MD 11/14/2024 4:33 PM  For on call review [email or web address].  "

## 2024-11-14 NOTE — ED Notes (Signed)
 Sandwich, crackers, and water provided to patient per request, denies any other needs at this time.
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
## 2024-11-14 NOTE — ED Notes (Signed)
 Pt cleaned of stool and assisted back to bed.

## 2024-11-14 NOTE — ED Notes (Signed)
 Pt given cup of ice cream per RN approval.

## 2024-11-14 NOTE — ED Notes (Signed)
 DSS w/ pt

## 2024-11-15 DIAGNOSIS — K226 Gastro-esophageal laceration-hemorrhage syndrome: Secondary | ICD-10-CM | POA: Diagnosis not present

## 2024-11-15 DIAGNOSIS — K7030 Alcoholic cirrhosis of liver without ascites: Secondary | ICD-10-CM | POA: Diagnosis not present

## 2024-11-15 DIAGNOSIS — I480 Paroxysmal atrial fibrillation: Secondary | ICD-10-CM | POA: Diagnosis not present

## 2024-11-15 DIAGNOSIS — E43 Unspecified severe protein-calorie malnutrition: Secondary | ICD-10-CM

## 2024-11-15 DIAGNOSIS — K2100 Gastro-esophageal reflux disease with esophagitis, without bleeding: Secondary | ICD-10-CM | POA: Diagnosis not present

## 2024-11-15 DIAGNOSIS — I5032 Chronic diastolic (congestive) heart failure: Secondary | ICD-10-CM | POA: Diagnosis not present

## 2024-11-15 DIAGNOSIS — I10 Essential (primary) hypertension: Secondary | ICD-10-CM | POA: Diagnosis not present

## 2024-11-15 DIAGNOSIS — D6959 Other secondary thrombocytopenia: Secondary | ICD-10-CM | POA: Diagnosis not present

## 2024-11-15 DIAGNOSIS — E876 Hypokalemia: Secondary | ICD-10-CM | POA: Diagnosis not present

## 2024-11-15 DIAGNOSIS — E785 Hyperlipidemia, unspecified: Secondary | ICD-10-CM | POA: Diagnosis not present

## 2024-11-15 DIAGNOSIS — S069X9D Unspecified intracranial injury with loss of consciousness of unspecified duration, subsequent encounter: Secondary | ICD-10-CM | POA: Diagnosis not present

## 2024-11-15 DIAGNOSIS — E871 Hypo-osmolality and hyponatremia: Secondary | ICD-10-CM | POA: Diagnosis not present

## 2024-11-15 DIAGNOSIS — F1020 Alcohol dependence, uncomplicated: Secondary | ICD-10-CM | POA: Diagnosis not present

## 2024-11-15 NOTE — ED Notes (Signed)
 Pt pressed call bell, reports room was too warm. Thermostat adjusted, pt provided with fan as well, denies any additional needs, NAD noted, call bell within reach, bed in lowest, locked position

## 2024-11-15 NOTE — ED Notes (Signed)
 [facility]  CALLED FOR  TRANSPORT TO  [facility]

## 2024-11-15 NOTE — TOC Transition Note (Addendum)
 Transition of Care [facility]) - Discharge Note   Patient Details  Name: Douglas Peterson. MRN: [id number] Date of Birth: 08/06/1951  [facility]) CM/SW Contact:  Lauraine JAYSON Carpen, LCSW Phone Number: 11/15/2024, 11:49 AM   Clinical Narrative:  Patient has orders to discharge to [facility] today. RN will call report to [phone] (Room 601A). He is aware that there is COVID in the building but no confirmed cases on the hall he will be going to. ED nurse secretary will arrange ambulance transport. SNF requested that it be set up for around 3:00. No further concerns. CSW signing off.  Final next level of care: Skilled Nursing Facility Barriers to Discharge: Barriers Resolved   Patient Goals and CMS Choice            Discharge Placement                Patient to be transferred to facility by: [facility]   Patient and family notified of of transfer: 11/15/24  Discharge Plan and Services Additional resources added to the After Visit Summary for                                       Social Drivers of Health (SDOH) Interventions SDOH Screenings   Food Insecurity: No Food Insecurity (09/11/2024)  Housing: Low Risk (09/11/2024)  Transportation Needs: No Transportation Needs (09/11/2024)  Utilities: Not At Risk (09/11/2024)  Social Connections: Unknown (09/11/2024)  Recent Concern: Social Connections - Socially Isolated (09/04/2024)  Tobacco Use: Medium Risk (11/11/2024)     Readmission Risk Interventions    04/11/2024    5:11 PM 01/07/2024   11:31 AM 06/17/2023    4:23 PM  Readmission Risk Prevention Plan  Transportation Screening -- Complete Complete  HRI or Home Care Consult   Complete  Social Work Consult for Recovery Care Planning/Counseling   Complete  Palliative Care Screening   Not Applicable  Medication Review Oceanographer) -- Complete Complete  PCP or Specialist appointment within 3-5 days of discharge  Complete   HRI or  Home Care Consult Patient refused Complete   SW Recovery Care/Counseling Consult Patient refused Complete   Palliative Care Screening Patient Refused Not Applicable   Skilled Nursing Facility -- Not Applicable
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
## 2024-11-15 NOTE — Discharge Planning (Signed)
 Patient will be discharging to [facility]. [facility] called and s/w Wells Contras, LPN.  [facility] contacted and will arriving at 3P or later (per facility request) to transport.

## 2024-11-15 NOTE — ED Notes (Signed)
 Patient verbalizes understanding of discharge instructions. Opportunity for questioning and answers were provided. Armband removed by staff, pt discharged from ED. Left to return back to [facility] via [facility].

## 2025-01-04 ENCOUNTER — Ambulatory Visit: Admitting: Physician Assistant
